# Patient Record
Sex: Male | Born: 1965
Health system: Southern US, Community
[De-identification: ages and names within clinical notes are randomized; demographics above are authoritative.]

## PROBLEM LIST (undated history)

## (undated) DIAGNOSIS — R251 Tremor, unspecified: Secondary | ICD-10-CM

## (undated) DIAGNOSIS — R209 Unspecified disturbances of skin sensation: Secondary | ICD-10-CM

## (undated) DIAGNOSIS — K3184 Gastroparesis: Secondary | ICD-10-CM

## (undated) DIAGNOSIS — G894 Chronic pain syndrome: Secondary | ICD-10-CM

## (undated) DIAGNOSIS — C679 Malignant neoplasm of bladder, unspecified: Secondary | ICD-10-CM

## (undated) DIAGNOSIS — G5603 Carpal tunnel syndrome, bilateral upper limbs: Secondary | ICD-10-CM

## (undated) DIAGNOSIS — E119 Type 2 diabetes mellitus without complications: Secondary | ICD-10-CM

## (undated) DIAGNOSIS — K219 Gastro-esophageal reflux disease without esophagitis: Secondary | ICD-10-CM

## (undated) DIAGNOSIS — G5621 Lesion of ulnar nerve, right upper limb: Secondary | ICD-10-CM

## (undated) DIAGNOSIS — Z87898 Personal history of other specified conditions: Secondary | ICD-10-CM

## (undated) DIAGNOSIS — F4310 Post-traumatic stress disorder, unspecified: Secondary | ICD-10-CM

## (undated) DIAGNOSIS — F3181 Bipolar II disorder: Secondary | ICD-10-CM

## (undated) DIAGNOSIS — M1990 Unspecified osteoarthritis, unspecified site: Secondary | ICD-10-CM

## (undated) DIAGNOSIS — R410 Disorientation, unspecified: Secondary | ICD-10-CM

## (undated) DIAGNOSIS — Z87828 Personal history of other (healed) physical injury and trauma: Secondary | ICD-10-CM

## (undated) DIAGNOSIS — G4733 Obstructive sleep apnea (adult) (pediatric): Secondary | ICD-10-CM

## (undated) DIAGNOSIS — Z8711 Personal history of peptic ulcer disease: Secondary | ICD-10-CM

## (undated) DIAGNOSIS — F419 Anxiety disorder, unspecified: Secondary | ICD-10-CM

## (undated) DIAGNOSIS — I2510 Atherosclerotic heart disease of native coronary artery without angina pectoris: Secondary | ICD-10-CM

## (undated) DIAGNOSIS — G4700 Insomnia, unspecified: Secondary | ICD-10-CM

## (undated) DIAGNOSIS — Z8669 Personal history of other diseases of the nervous system and sense organs: Secondary | ICD-10-CM

## (undated) DIAGNOSIS — E039 Hypothyroidism, unspecified: Secondary | ICD-10-CM

## (undated) DIAGNOSIS — M5136 Other intervertebral disc degeneration, lumbar region: Secondary | ICD-10-CM

## (undated) DIAGNOSIS — Z8719 Personal history of other diseases of the digestive system: Secondary | ICD-10-CM

## (undated) DIAGNOSIS — Z8709 Personal history of other diseases of the respiratory system: Secondary | ICD-10-CM

## (undated) DIAGNOSIS — Z8673 Personal history of transient ischemic attack (TIA), and cerebral infarction without residual deficits: Secondary | ICD-10-CM

## (undated) DIAGNOSIS — Z87442 Personal history of urinary calculi: Secondary | ICD-10-CM

## (undated) DIAGNOSIS — Z8551 Personal history of malignant neoplasm of bladder: Secondary | ICD-10-CM

## (undated) DIAGNOSIS — R569 Unspecified convulsions: Secondary | ICD-10-CM

## (undated) DIAGNOSIS — E1142 Type 2 diabetes mellitus with diabetic polyneuropathy: Secondary | ICD-10-CM

## (undated) DIAGNOSIS — M069 Rheumatoid arthritis, unspecified: Secondary | ICD-10-CM

## (undated) DIAGNOSIS — E785 Hyperlipidemia, unspecified: Secondary | ICD-10-CM

## (undated) DIAGNOSIS — Z794 Long term (current) use of insulin: Secondary | ICD-10-CM

## (undated) DIAGNOSIS — F319 Bipolar disorder, unspecified: Secondary | ICD-10-CM

## (undated) DIAGNOSIS — I10 Essential (primary) hypertension: Secondary | ICD-10-CM

## (undated) DIAGNOSIS — Z8619 Personal history of other infectious and parasitic diseases: Secondary | ICD-10-CM

## (undated) DIAGNOSIS — G4730 Sleep apnea, unspecified: Secondary | ICD-10-CM

## (undated) DIAGNOSIS — E291 Testicular hypofunction: Secondary | ICD-10-CM

## (undated) DIAGNOSIS — E1140 Type 2 diabetes mellitus with diabetic neuropathy, unspecified: Secondary | ICD-10-CM

## (undated) DIAGNOSIS — K449 Diaphragmatic hernia without obstruction or gangrene: Secondary | ICD-10-CM

## (undated) DIAGNOSIS — C801 Malignant (primary) neoplasm, unspecified: Secondary | ICD-10-CM

## (undated) DIAGNOSIS — R6889 Other general symptoms and signs: Secondary | ICD-10-CM

## (undated) DIAGNOSIS — M51369 Other intervertebral disc degeneration, lumbar region without mention of lumbar back pain or lower extremity pain: Secondary | ICD-10-CM

## (undated) DIAGNOSIS — J449 Chronic obstructive pulmonary disease, unspecified: Secondary | ICD-10-CM

## (undated) DIAGNOSIS — G250 Essential tremor: Secondary | ICD-10-CM

## (undated) HISTORY — PX: CARDIAC CATHETERIZATION: SHX172

## (undated) HISTORY — DX: Bipolar disorder, unspecified: F31.9

## (undated) HISTORY — DX: Unspecified osteoarthritis, unspecified site: M19.90

## (undated) HISTORY — DX: Atherosclerotic heart disease of native coronary artery without angina pectoris: I25.10

## (undated) HISTORY — PX: OTHER SURGICAL HISTORY: SHX169

## (undated) HISTORY — DX: Gastro-esophageal reflux disease without esophagitis: K21.9

## (undated) HISTORY — PX: CARPAL TUNNEL RELEASE: SHX101

---

## 1998-11-23 ENCOUNTER — Emergency Department (HOSPITAL_COMMUNITY): Admission: EM | Admit: 1998-11-23 | Discharge: 1998-11-23 | Payer: Self-pay | Admitting: *Deleted

## 1999-05-16 ENCOUNTER — Encounter: Admission: RE | Admit: 1999-05-16 | Discharge: 1999-08-14 | Payer: Self-pay | Admitting: Family Medicine

## 2000-02-24 ENCOUNTER — Ambulatory Visit (HOSPITAL_COMMUNITY): Admission: RE | Admit: 2000-02-24 | Discharge: 2000-02-24 | Payer: Self-pay | Admitting: *Deleted

## 2000-02-24 ENCOUNTER — Encounter: Payer: Self-pay | Admitting: *Deleted

## 2000-02-27 ENCOUNTER — Ambulatory Visit (HOSPITAL_COMMUNITY): Admission: RE | Admit: 2000-02-27 | Discharge: 2000-02-27 | Payer: Self-pay | Admitting: General Surgery

## 2000-02-28 ENCOUNTER — Encounter: Payer: Self-pay | Admitting: General Surgery

## 2000-12-10 ENCOUNTER — Encounter: Payer: Self-pay | Admitting: Emergency Medicine

## 2000-12-10 ENCOUNTER — Emergency Department (HOSPITAL_COMMUNITY): Admission: EM | Admit: 2000-12-10 | Discharge: 2000-12-11 | Payer: Self-pay | Admitting: Emergency Medicine

## 2001-12-26 ENCOUNTER — Encounter: Payer: Self-pay | Admitting: Cardiology

## 2001-12-26 ENCOUNTER — Inpatient Hospital Stay (HOSPITAL_COMMUNITY): Admission: AD | Admit: 2001-12-26 | Discharge: 2001-12-27 | Payer: Self-pay | Admitting: Cardiology

## 2002-02-03 ENCOUNTER — Emergency Department (HOSPITAL_COMMUNITY): Admission: EM | Admit: 2002-02-03 | Discharge: 2002-02-03 | Payer: Self-pay | Admitting: Emergency Medicine

## 2002-05-27 ENCOUNTER — Encounter: Admission: RE | Admit: 2002-05-27 | Discharge: 2002-05-27 | Payer: Self-pay | Admitting: Family Medicine

## 2002-05-27 ENCOUNTER — Encounter: Payer: Self-pay | Admitting: Family Medicine

## 2002-06-06 ENCOUNTER — Ambulatory Visit (HOSPITAL_COMMUNITY): Admission: RE | Admit: 2002-06-06 | Discharge: 2002-06-06 | Payer: Self-pay | Admitting: Family Medicine

## 2002-06-06 ENCOUNTER — Encounter: Payer: Self-pay | Admitting: Family Medicine

## 2003-01-31 ENCOUNTER — Emergency Department (HOSPITAL_COMMUNITY): Admission: EM | Admit: 2003-01-31 | Discharge: 2003-01-31 | Payer: Self-pay | Admitting: Emergency Medicine

## 2003-02-22 ENCOUNTER — Ambulatory Visit (HOSPITAL_COMMUNITY): Admission: RE | Admit: 2003-02-22 | Discharge: 2003-02-22 | Payer: Self-pay | Admitting: *Deleted

## 2003-04-16 ENCOUNTER — Ambulatory Visit (HOSPITAL_BASED_OUTPATIENT_CLINIC_OR_DEPARTMENT_OTHER): Admission: RE | Admit: 2003-04-16 | Discharge: 2003-04-16 | Payer: Self-pay | Admitting: Urology

## 2003-06-07 ENCOUNTER — Emergency Department (HOSPITAL_COMMUNITY): Admission: AD | Admit: 2003-06-07 | Discharge: 2003-06-07 | Payer: Self-pay | Admitting: Internal Medicine

## 2003-06-17 ENCOUNTER — Ambulatory Visit (HOSPITAL_COMMUNITY): Admission: RE | Admit: 2003-06-17 | Discharge: 2003-06-17 | Payer: Self-pay | Admitting: Family Medicine

## 2003-09-16 ENCOUNTER — Ambulatory Visit (HOSPITAL_COMMUNITY): Admission: RE | Admit: 2003-09-16 | Discharge: 2003-09-16 | Payer: Self-pay | Admitting: Orthopedic Surgery

## 2003-09-16 ENCOUNTER — Ambulatory Visit (HOSPITAL_BASED_OUTPATIENT_CLINIC_OR_DEPARTMENT_OTHER): Admission: RE | Admit: 2003-09-16 | Discharge: 2003-09-16 | Payer: Self-pay | Admitting: Orthopedic Surgery

## 2003-09-16 HISTORY — PX: CARPAL TUNNEL RELEASE: SHX101

## 2004-12-11 HISTORY — PX: ROTATOR CUFF REPAIR: SHX139

## 2005-01-08 ENCOUNTER — Emergency Department (HOSPITAL_COMMUNITY): Admission: EM | Admit: 2005-01-08 | Discharge: 2005-01-08 | Payer: Self-pay | Admitting: Emergency Medicine

## 2005-04-28 ENCOUNTER — Encounter: Admission: RE | Admit: 2005-04-28 | Discharge: 2005-04-28 | Payer: Self-pay | Admitting: Orthopedic Surgery

## 2005-05-12 ENCOUNTER — Encounter: Admission: RE | Admit: 2005-05-12 | Discharge: 2005-05-12 | Payer: Self-pay | Admitting: Orthopedic Surgery

## 2005-10-13 ENCOUNTER — Emergency Department (HOSPITAL_COMMUNITY): Admission: EM | Admit: 2005-10-13 | Discharge: 2005-10-13 | Payer: Self-pay | Admitting: Emergency Medicine

## 2005-10-16 ENCOUNTER — Emergency Department (HOSPITAL_COMMUNITY): Admission: EM | Admit: 2005-10-16 | Discharge: 2005-10-16 | Payer: Self-pay | Admitting: Family Medicine

## 2005-12-08 ENCOUNTER — Emergency Department (HOSPITAL_COMMUNITY): Admission: EM | Admit: 2005-12-08 | Discharge: 2005-12-08 | Payer: Self-pay | Admitting: Family Medicine

## 2005-12-17 ENCOUNTER — Ambulatory Visit (HOSPITAL_COMMUNITY): Admission: RE | Admit: 2005-12-17 | Discharge: 2005-12-17 | Payer: Self-pay | Admitting: Family Medicine

## 2005-12-18 ENCOUNTER — Emergency Department (HOSPITAL_COMMUNITY): Admission: EM | Admit: 2005-12-18 | Discharge: 2005-12-18 | Payer: Self-pay | Admitting: Emergency Medicine

## 2006-02-06 ENCOUNTER — Ambulatory Visit: Payer: Self-pay | Admitting: Internal Medicine

## 2006-02-20 ENCOUNTER — Ambulatory Visit: Payer: Self-pay | Admitting: Internal Medicine

## 2006-03-13 DIAGNOSIS — Z8673 Personal history of transient ischemic attack (TIA), and cerebral infarction without residual deficits: Secondary | ICD-10-CM

## 2006-03-13 HISTORY — DX: Personal history of transient ischemic attack (TIA), and cerebral infarction without residual deficits: Z86.73

## 2006-12-11 ENCOUNTER — Emergency Department (HOSPITAL_COMMUNITY): Admission: EM | Admit: 2006-12-11 | Discharge: 2006-12-11 | Payer: Self-pay | Admitting: Emergency Medicine

## 2007-01-20 ENCOUNTER — Inpatient Hospital Stay (HOSPITAL_COMMUNITY): Admission: EM | Admit: 2007-01-20 | Discharge: 2007-01-21 | Payer: Self-pay | Admitting: Emergency Medicine

## 2007-05-10 ENCOUNTER — Emergency Department (HOSPITAL_COMMUNITY): Admission: EM | Admit: 2007-05-10 | Discharge: 2007-05-10 | Payer: Self-pay | Admitting: Family Medicine

## 2008-02-13 ENCOUNTER — Encounter: Admission: RE | Admit: 2008-02-13 | Discharge: 2008-02-13 | Payer: Self-pay | Admitting: Internal Medicine

## 2008-03-13 DIAGNOSIS — Z87828 Personal history of other (healed) physical injury and trauma: Secondary | ICD-10-CM

## 2008-03-13 HISTORY — DX: Personal history of other (healed) physical injury and trauma: Z87.828

## 2009-05-26 ENCOUNTER — Inpatient Hospital Stay (HOSPITAL_COMMUNITY): Admission: AD | Admit: 2009-05-26 | Discharge: 2009-05-27 | Payer: Self-pay | Admitting: Cardiology

## 2009-06-27 ENCOUNTER — Emergency Department (HOSPITAL_COMMUNITY): Admission: EM | Admit: 2009-06-27 | Discharge: 2009-06-27 | Payer: Self-pay | Admitting: Emergency Medicine

## 2009-08-20 ENCOUNTER — Emergency Department (HOSPITAL_COMMUNITY): Admission: EM | Admit: 2009-08-20 | Discharge: 2009-08-20 | Payer: Self-pay | Admitting: Emergency Medicine

## 2010-03-21 ENCOUNTER — Emergency Department (HOSPITAL_COMMUNITY)
Admission: EM | Admit: 2010-03-21 | Discharge: 2010-03-21 | Payer: Self-pay | Source: Home / Self Care | Admitting: Family Medicine

## 2010-04-03 ENCOUNTER — Encounter: Payer: Self-pay | Admitting: Specialist

## 2010-06-06 LAB — CBC
HCT: 42.9 % (ref 39.0–52.0)
HCT: 43.5 % (ref 39.0–52.0)
Hemoglobin: 15.2 g/dL (ref 13.0–17.0)
Hemoglobin: 15.4 g/dL (ref 13.0–17.0)
MCHC: 35.5 g/dL (ref 30.0–36.0)
MCHC: 35.5 g/dL (ref 30.0–36.0)
MCV: 86.5 fL (ref 78.0–100.0)
MCV: 86.7 fL (ref 78.0–100.0)
Platelets: 203 10*3/uL (ref 150–400)
Platelets: 222 10*3/uL (ref 150–400)
RBC: 4.95 MIL/uL (ref 4.22–5.81)
RBC: 5.03 MIL/uL (ref 4.22–5.81)
RDW: 12.1 % (ref 11.5–15.5)
RDW: 12.6 % (ref 11.5–15.5)
WBC: 6 10*3/uL (ref 4.0–10.5)
WBC: 6.1 10*3/uL (ref 4.0–10.5)

## 2010-06-06 LAB — DIFFERENTIAL
Basophils Absolute: 0 10*3/uL (ref 0.0–0.1)
Basophils Relative: 1 % (ref 0–1)
Eosinophils Absolute: 0.2 10*3/uL (ref 0.0–0.7)
Eosinophils Relative: 4 % (ref 0–5)
Lymphocytes Relative: 38 % (ref 12–46)
Lymphs Abs: 2.3 10*3/uL (ref 0.7–4.0)
Monocytes Absolute: 0.6 10*3/uL (ref 0.1–1.0)
Monocytes Relative: 9 % (ref 3–12)
Neutro Abs: 2.9 10*3/uL (ref 1.7–7.7)
Neutrophils Relative %: 48 % (ref 43–77)

## 2010-06-06 LAB — PROTIME-INR
INR: 0.84 (ref 0.00–1.49)
Prothrombin Time: 11.4 seconds — ABNORMAL LOW (ref 11.6–15.2)

## 2010-06-06 LAB — HEPARIN LEVEL (UNFRACTIONATED): Heparin Unfractionated: <0.1 IU/mL — ABNORMAL LOW (ref 0.30–0.70)

## 2010-06-06 LAB — CARDIAC PANEL(CRET KIN+CKTOT+MB+TROPI)
CK, MB: 2.1 ng/mL (ref 0.3–4.0)
CK, MB: 2.4 ng/mL (ref 0.3–4.0)
Relative Index: INVALID (ref 0.0–2.5)
Relative Index: INVALID (ref 0.0–2.5)
Total CK: 82 U/L (ref 7–232)
Total CK: 92 U/L (ref 7–232)
Troponin I: 0.02 ng/mL (ref 0.00–0.06)
Troponin I: 0.03 ng/mL (ref 0.00–0.06)

## 2010-06-06 LAB — GLUCOSE, CAPILLARY
Glucose-Capillary: 208 mg/dL — ABNORMAL HIGH (ref 70–99)
Glucose-Capillary: 247 mg/dL — ABNORMAL HIGH (ref 70–99)
Glucose-Capillary: 251 mg/dL — ABNORMAL HIGH (ref 70–99)
Glucose-Capillary: 295 mg/dL — ABNORMAL HIGH (ref 70–99)
Glucose-Capillary: 315 mg/dL — ABNORMAL HIGH (ref 70–99)
Glucose-Capillary: 319 mg/dL — ABNORMAL HIGH (ref 70–99)

## 2010-06-06 LAB — COMPREHENSIVE METABOLIC PANEL
ALT: 24 U/L (ref 0–53)
AST: 21 U/L (ref 0–37)
Albumin: 3.9 g/dL (ref 3.5–5.2)
Alkaline Phosphatase: 71 U/L (ref 39–117)
BUN: 10 mg/dL (ref 6–23)
CO2: 25 mEq/L (ref 19–32)
Calcium: 8.9 mg/dL (ref 8.4–10.5)
Chloride: 102 mEq/L (ref 96–112)
Creatinine, Ser: 0.79 mg/dL (ref 0.4–1.5)
GFR calc Af Amer: >60 mL/min (ref >60)
GFR calc non Af Amer: >60 mL/min (ref >60)
Glucose, Bld: 302 mg/dL — ABNORMAL HIGH (ref 70–99)
Potassium: 4 mEq/L (ref 3.5–5.1)
Sodium: 134 mEq/L — ABNORMAL LOW (ref 135–145)
Total Bilirubin: 1 mg/dL (ref 0.3–1.2)
Total Protein: 6.2 g/dL (ref 6.0–8.3)

## 2010-06-06 LAB — LIPID PANEL
Cholesterol: 224 mg/dL — ABNORMAL HIGH (ref 0–200)
HDL: 23 mg/dL — ABNORMAL LOW (ref >39)
LDL Cholesterol: UNDETERMINED mg/dL (ref 0–99)
Total CHOL/HDL Ratio: 9.7 RATIO
Triglycerides: 832 mg/dL — ABNORMAL HIGH (ref <150)
VLDL: UNDETERMINED mg/dL (ref 0–40)

## 2010-06-06 LAB — TSH: TSH: 4.767 u[IU]/mL — ABNORMAL HIGH (ref 0.350–4.500)

## 2010-07-29 NOTE — Discharge Summary (Signed)
NAME:  Vincent [name], Vincent [name]                         ACCOUNT NO.:  [id number]   MEDICAL RECORD NO.:  [id number]                   PATIENT TYPE:  INP   LOCATION:  2011                                 FACILITY:  [facility]   PHYSICIAN:  Cristy Hilts. Jacinto Halim, M.D.                  DATE OF BIRTH:  1965/05/11   DATE OF ADMISSION:  12/26/2001  DATE OF DISCHARGE:  12/27/2001                                 DISCHARGE SUMMARY   DISCHARGE DIAGNOSES:  1. Unstable angina, resolved, status post catheterization with normal     coronaries during this admission.  2. Hypertension.  3. Hypercholesterolemia.  4. Positive family history of coronary artery disease.  5. Chronic obstructive pulmonary disease.  6. Gastroesophageal reflux disease.  7. Ongoing tobacco use.   HISTORY OF PRESENT ILLNESS:  The patient is a 44 year old, white married  male who presented to the office with complaints of chest pain.  He says he  has been having this pain for 1-1/2 years which has progressively worsened  and over the past five months, had increased in frequency and intensity.  The pain radiated to the back and sometimes he experiences discomfort around  the diaphragm with shortness of breath and extreme diaphoresis.  He also  experiences presyncope and weakness.   The patient's wife insisted that he be seen in the office and he was a  direct admit from the office with a plan to undergo cardiac catheterization  the next day.   PROCEDURE:  Cardiac catheterization the next morning on December 27, 2001.  The catheterization was tolerated well and carried out with no  complications.  The procedure revealed normal coronaries and normal left  ventricular systolic function with ejection fraction 60% and end-diastolic  pressure 18.   HOSPITAL COURSE:  The patient was considered to be stable for discharge from  a cardiovascular standpoint and discharge was carried out post bed rest time  expired.  During the hospital stay, other  laboratories revealed hemoglobin  15.3, hematocrit 45.6, white count 10.1, platelets 262.  Sodium was 136,  potassium 3.7, chloride 98, CO2 25, glucose 305, BUN 10 and creatinine 0.8  with calcium 8.8.  Hemoglobin A1C was elevated at 7.8.  Cholesterol was 276,  triglycerides 1783 with cholesterol HDL 223 and LDL not calculated.   DISCHARGE MEDICATIONS:  1. Avapro 150 mg daily.  2. Amaryl 4 mg daily.  3. Pravachol 40 mg daily.  4. Tri-Chlor 150 mg daily.  5. Coated aspirin 325 mg daily.  6. Protonix 40 mg daily.   ACTIVITY:  No driving.  No lifting greater than 10 pounds.  No strenuous  activity x3 days.   SPECIAL INSTRUCTIONS:  The patient was allowed to shower and instructed to  report any problems of his groin site to our office and number was provided.   DIET:  Low fat, low carbohydrate, low sweets and low alcohol diet.  FOLLOW UP:  Dr. Verl Dicker office is to call the patient to set up an  appointment to be seen after discharge.      Raymon Mutton, P.A.                    Cristy Hilts. Jacinto Halim, M.D.    MK/MEDQ  D:  12/27/2001  T:  12/29/2001  Job:  [id number]   cc:   Donia Guiles, MD

## 2010-07-29 NOTE — H&P (Signed)
NAME:  Vincent [name], Vincent [name]                         ACCOUNT NO.:  [id number]   MEDICAL RECORD NO.:  [id number]                   PATIENT TYPE:  INP   LOCATION:  2011                                 FACILITY:  [facility]   PHYSICIAN:  Cristy Hilts. Jacinto Halim, M.D.                  DATE OF BIRTH:  04/26/1965   DATE OF ADMISSION:  12/26/2001  DATE OF DISCHARGE:                                HISTORY & PHYSICAL   ADMISSION DIAGNOSES:  1. Unstable angina.  2. Hypertension.  3. Noninsulin-dependent diabetes.  4. Hyperlipidemia.  5. Tobacco use.  6. Gastroesophageal reflux.   CHIEF COMPLAINT:  Progressive chest pain/angina.   HISTORY OF PRESENT ILLNESS:  This is a 45 year old married white male with a  number of cardiac risk factors including hypertension, hyperlipidemia  (triglycerides in the 1,200s), NIDDM x3 years, and tobacco of 60 pack years.  Wife scheduled today's appointment secondary to increased episodes of chest  pain. The patient relates one and a have history of progressive angina with  activity and rest. Over the past five months, he has had increased frequency  of angina, occurring every other day on average; 5/10, intense pressure  like standing on my chest, with radiation to the back and discomfort  around the diaphragm. Positive shortness of breath/smothering feeling and  extreme diaphoresis associated. Presyncope as well. Episodes last 5 to 20  minutes. Has been awoken from sleep. Symptoms generally resolve with rest or  time. He did have an episode of 20 minute chest pain this morning. He eats  aspirin like candy.   ALLERGIES:  Sulfa.   MEDICATIONS:  1. Amaryl 4 mg.  2. Micardis 40 mg.  3. Zetia 10 mg.  4. Pravachol 40 mg.  5. Prilosec 20 mg.  6. Aspirin.  7. Diflucan 150 mg a day for seven days.  8. Cephalexin 250 mg four a day for seven days.   PAST MEDICAL HISTORY:  1. Significant for hyperlipidemia for two years; fasting lipid profile     10/19/01 showed total cholesterol  222, triglycerides 1,206, HDL 27, LDL not     calculated.  2. Hypertension for four months.  3. Diabetes mellitus type 2 for three years with some symptoms of     neuropathy.  4. GERD.  5. Recurrent UTI.   FAMILY HISTORY:  Mother age 36 with hypertension and diabetes. Father age 110  with hypertension and diabetes and stroke.   SOCIAL HISTORY:  The patient is married for three years. Father of three  (11, 4, 17 months). He is long distance, cross-country Naval architect. Three  pack a day tobacco for 20 years. Positive alcohol. No illicit drug use.   REVIEW OF SYMPTOMS:  No fevers, chills, or cough. He has no teeth. Positive  dyspnea on exertion. No PND or orthopnea. Positive UTI with hematuria on  antibiotics (Dr. Etta Grandchild follows him). Positive numbness and tingling of the  feet, blurred vision at a distance for one week.   PHYSICAL EXAMINATION:  VITAL SIGNS:  Blood pressure 156/88, pulse 92,  respirations 18, weight 237, height 5 feet 11-1/2.  GENERAL:  The patient is alert and oriented x3 in no acute distress,  accompanied by his wife.  HEENT:  Normocephalic, atraumatic. PERRLA, EOMI. Nares patent. Oropharynx  benign; no teeth.  NECK:  Supple without bruits or masses.  LUNGS:  Clear to auscultation without wheeze.  HEART:  Regular, rate, and rhythm without murmurs, gallops, or rubs.  ABDOMEN:  Soft, nontender, nondistended, normal active bowel sounds in all  four quadrants. Nodes none. Obese. No bruits.  EXTREMITIES:  With 2+ femoral pulses bilaterally, no bruits. Two pulses  bilaterally without edema. NEUROLOGICAL:  Nonfocal.  RECTAL:  Not performed in the office. Stools can be heme checked in the  hospital.   LABORATORY DATA:  EKG shows normal sinus rhythm without acute ST-T wave  abnormality.   The patient was seen and examined by Dr. Yates Decamp.   IMPRESSION:  1. Unstable angina.  2. Hypertension.  3. Noninsulin-dependent diabetes.  4. Hyperlipidemia with elevated  triglycerides.  5. Tobacco abuse.  6. Gastroesophageal reflux disease.  7. Urinary tract infection.   PLAN:  Admit. IV heparin. IV nitroglycerin. Aspirin. Beta blocker. ARB  therapy. Statin therapy. Heart catheterization tomorrow.   The risks and benefits of the catheterization are reviewed with the patient  and his wife. They agree to proceed. He is for a direct admit from the  office.     Georgiann Cocker Jernejcic, P.A.                   Cristy Hilts. Jacinto Halim, M.D.    TCJ/MEDQ  D:  12/26/2001  T:  12/27/2001  Job:  [id number]

## 2010-07-29 NOTE — Assessment & Plan Note (Signed)
[facility]                           GASTROENTEROLOGY OFFICE NOTE   NAME:[name], [name]                      MRN:          [id number]  DATE:02/06/2006                            DOB:          04/01/1965    The patient is self-referred.   REASON FOR CONSULTATION:  Diarrhea.   HISTORY:  This is a 45 year old white male with diabetes mellitus,  hypertension, and coronary artery disease for which he has undergone prior  coronary angioplasty.  He presents today for evaluation of diarrhea.  He was  evaluated in this office June 02, 2002 for weight loss, change in bowel  habits, and consideration of endoscopic evaluation.  His problems were felt  secondary to poorly-controlled diabetes.  He underwent both colonoscopy and  upper endoscopy.  Colonoscopy, including intubation of the terminal ileum,  was entirely normal.  Upper endoscopy was normal, except for the presence of  a hiatal hernia.  He takes Prilosec for reflux disease.  This controls  symptoms well.  He states that he was in his usual state of health until  about 2 to 3 weeks ago when he reports the development of postprandial  diarrhea.  Diarrhea occurring within 1 hour of eating.  More recently  diarrhea without eating.  To date, no nocturnal diarrhea.  He states the  diarrhea has a foul odor, though no unusual color.  He denies fevers or  bleeding.  He has had some abdominal discomfort.  No weight loss.  Of  importance, he was treated in October with a combination of amoxicillin and  Augmentin, as well as Diflucan for some ear, nose, and throat disorder  diagnosed by Dr. Lazarus Salines.  He has tried Imodium, Kaopectate, and Pepto-  Bismol without improvement.  He denies any of his medications are new within  the past year.  In particular, he states he has been on metformin for years.   PAST MEDICAL HISTORY:  As above.   PAST SURGICAL HISTORY:  Rotator cuff tear surgery.   ALLERGIES:  1.  SULFA.  2. CELEBREX.   CURRENT MEDICATIONS:  1. Metformin 1000 mg b.i.d.  2. Glimepiride 8 mg b.i.d.  3. Lantus insulin 40 units daily.  4. Citalopram unspecified dosage daily.  5. B12 daily.  6. Prilosec 20 mg daily.  7. He also takes Excedrin p.r.n.   FAMILY HISTORY:  No family history of gastrointestinal malignancy or  inflammatory bowel disease.   SOCIAL HISTORY:  The patient is married.  He is accompanied by his wife.  He  has 1 step-son and 3 daughters, tenth grade education.  He is employed as a  Clinical cytogeneticist.  He smokes.  He uses alcohol.   REVIEW OF SYSTEMS:  Per diagnostic evaluation form.   PHYSICAL EXAM:  A well-appearing male in no acute distress.  Blood pressure 142/92, heart rate is 88, weight is 248 pounds.  He is 5 feet  11-1/2 inches in height.  HEENT:  Sclerae anicteric, conjunctivae are pink.  Oral mucosa is intact.  Tongue is tobacco stained.  LUNGS:  Clear.  HEART:  Regular.  ABDOMEN:  Obese and soft without tenderness, mass, or hernia.  Good bowel  sounds heard.  EXTREMITIES:  Without edema.   IMPRESSION:  This is a 45 year old with multiple medical problems who  presents with a 2 to 3 week history of diarrhea.  This shortly after  treatment with broad-spectrum antibiotics.  Rule out antibiotic-associated  diarrhea.  Rule out Clostridium difficile associated diarrhea.  Rule out  other infectious processes (viral or non-viral).   RECOMMENDATIONS:  1. Stool studies including stool culture, clostridium difficile toxin,      qualitative fat, ova and parasites, and leukocytes.  2. Empiric treatment with Flagyl 250 mg p.o. q.i.d. x10 days.  Advised not      to use alcohol.  3. Solicit old records from Dr. Lazarus Salines for review (DONE).  4. GI office followup in 2 weeks.     Wilhemina Bonito. Marina Goodell, MD  Electronically Signed    JNP/MedQ  DD: 02/06/2006  DT: 02/06/2006  Job #: [id number]

## 2010-07-29 NOTE — Assessment & Plan Note (Signed)
[facility]                         GASTROENTEROLOGY OFFICE NOTE   NAME:Soliday, CHAYIM BIALAS                      MRN:          [id number]  DATE:02/20/2006                            DOB:          07/19/1965    HISTORY:  The patient presents today for followup.  He is a 45 year old  with diabetes mellitus, hypertension, and coronary artery disease.  He  was evaluated on February 06, 2006, for diarrhea.  He underwent multiple  stool studies for enteric pathogens, ova and parasites, Clostridium  difficile toxin (prior recent antibiotic exposure), qualitative fat, and  leukocytes.  All were negative.  He was treated empirically with Flagyl  250 mg p.o. q.i.d. for 10 days.  He finished all but 3 pills.  He  reports no change in symptoms.  He returns today for followup as  requested.   The patient reports continuing with 5-6 loose bowel movements per day.  He has 2 immediately upon awakening in the morning.  Symptoms generally  occur after meals.  He is not awoken at night with symptoms.  He does  not describe steatorrhea.  His diabetes has been under good control.  He  does not use sugar substitutes.  He has had no weight loss.   He states his medications are unchanged and include Metformin,  glimepiride, Lantus insulin, citalopram, B12, Prilosec, Lisinopril,  TriCor, and fish oil.   PHYSICAL EXAMINATION:  GENERAL:  Well-appearing male in no acute  distress.  VITAL SIGNS:  Blood pressure is 124/80, heart rate is 80 and regular,  weight is 248 pounds (no change).  ABDOMEN:  Soft without tenderness, mass, or hernia.  Good bowel sounds  heard.   IMPRESSION:  A 4 to 5-week history of diarrhea.  Negative stool studies  as described.  Negative response to metronidazole.  No worrisome  features.  Suspect infectious gastroenteritis or possibly post  infectious irritable bowel syndrome.   RECOMMENDATIONS:  1. Treat symptomatically with Lomotil 1-2 p.o. t.i.d.  p.r.n.  2. Empiric course of Xifaxan 200 mg p.o. t.i.d. for ten days.  A      prescription as well as samples have been provided.  3. Office followup in 2 weeks.  If symptoms persist, consider      expanding workup including laboratory screening for sprue and      possibly repeat colonoscopy with biopsies to rule out microscopic      colitis.  Additionally, it may be worth him discussing with his      primary care Gabryella Murfin the prospects of holding Metformin as this      can indeed cause diarrhea.  However,      he has been on this  medication for a while. I would hold on this      latter manipulation for now.     Wilhemina Bonito. Marina Goodell, MD  Electronically Signed    JNP/MedQ  DD: 02/20/2006  DT: 02/20/2006  Job #: [id number]   cc:   Tracey Harries, M.D.

## 2010-07-29 NOTE — Cardiovascular Report (Signed)
NAME:  Vincent [name], Vincent [name]                         ACCOUNT NO.:  [id number]   MEDICAL RECORD NO.:  [id number]                   PATIENT TYPE:  INP   LOCATION:  2011                                 FACILITY:  [facility]   PHYSICIAN:  Cristy Hilts. Jacinto Halim, M.D.                  DATE OF BIRTH:  06-08-1965   DATE OF PROCEDURE:  12/27/2001  DATE OF DISCHARGE:                              CARDIAC CATHETERIZATION   PROCEDURE:  Left heart catheterization with left ventriculography, selective  left and right coronary arteriography, abdominal aortogram, atrial  angiography with right femoral artery access with Perclose.   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  Given his multiple cardiac risk factors and his ongoing  chest pain, he was brought to the cardiac catheterization lab to evaluate  for coronary anatomy.   HEMODYNAMIC DATA:  The left ventricular pressure square 8/6 end diastolic  pressure 18 mmHg.  Aortic pressure 108/77 with a mean of 93 mmHg.  There was  no pressure gradient across the aortic valve.   ANGIOGRAPHIC DATA:  1. Left ventricle.  Left ventricular systolic function was normal and EF was     estimated at 60%.  There is no significant mitral regurgitation.  2. Right coronary artery is a large caliber, dominant vessel.  It is smooth     and disease-free.  3. The left main coronary artery is a large caliber vessel.  It is normal.  4. Circumflex coronary artery is a large caliber vessel and it gives origin     to a large obtuse marginal 1 and continues as a small distal circumflex.     It is normal.  5. Ramus intermedius is very small and measures about 1.0 mm.  It is normal.  6. Left anterior descending artery is a large vessel.  It gives origin to a     very large diagonal 1 which is equal  to his left anterior descending     artery itself.  The diagonal 1 has multiple secondary branches.  They all     are disease-free.  7. Abdominal aortogram.  Abdominal aortogram revealed normal abdominal aorta     with widely patent renal arteries.  There is one renal artery on each     side.  8. Femoral angiography revealed good arterial access site.   IMPRESSION:  1. Normal left ventricular systolic function, ejection fraction 60%.  2. Normal coronary arteries.  3. Normal abdominal aorta with normal renal arteries.   RECOMMENDATIONS:  At this point, the chest pain is probably of noncardiac  etiology.  This probably is related to gastroesophageal reflux disease with  esophageal spasm and also COPD may be contributing to his shortness of  breath.  His  LVEDP is also slightly elevated and better control of  hypertension is indicated.   The patient can be discharged home on proton pump inhibitor.   DESCRIPTION OF PROCEDURE:  Under the usual sterile precautions using 6  French right femoral artery access, 6 [place] multipurpose B2 catheter was  advanced __________.  The catheter was advanced to the left ventricle.  The  left ventricle appeared normal.  Catheterization of the left ventricle was  performed in both RAO and LAO projections.  The catheter was pulled back  __________.  The right coronary artery was selectively cannulated and  angiography was performed. In the same fashion, the left main artery was  selectively cannulated and angiography was performed.  When the catheter was  pulled back in the abdominal aorta and abdominal aortogram was performed.  Then the catheter was pulled out of the body.  Right femoral angiography was  performed through the arterial access sheath and access was closed with  Perclose.  The patient was transferred to the recovery room in stable  condition.  The patient tolerated the procedure well.                                               Cristy Hilts.  Jacinto Halim, M.D.    Pilar Plate  D:  12/27/2001  T:  12/29/2001  Job:  [id number]   cc:   Donia Guiles, MD   [facility]

## 2010-07-29 NOTE — Op Note (Signed)
NAME:  Vincent [name], Vincent [name]                         ACCOUNT NO.:  [id number]   MEDICAL RECORD NO.:  [id number]                   PATIENT TYPE:  AMB   LOCATION:  [facility]                                  FACILITY:  [facility]   PHYSICIAN:  Feliberto Gottron. Turner Daniels, M.D.                DATE OF BIRTH:  1965/11/14   DATE OF PROCEDURE:  DATE OF DISCHARGE:                                 OPERATIVE REPORT   DATE OF OPERATION:  September 16, 2003.   PREOPERATIVE DIAGNOSES:  Right carpal tunnel syndrome.   POSTOPERATIVE DIAGNOSES:  Right carpal tunnel syndrome.   PROCEDURE:  Right carpal tunnel release.   SURGEON:  Feliberto Gottron. Turner Daniels, MD.   FIRST ASSISTANT:  Skip Mayer, PA-C.   ANESTHETIC:  IV regional.   ESTIMATED BLOOD LOSS:  Minimal.   FLUIDS REPLACED:  500 mL of crystalloid.   DRAINS PLACED:  None.   TOURNIQUET TIME:  25 minutes.   INDICATIONS FOR PROCEDURE:  A 46 year old man with EMG-proven, severe right  carpal tunnel syndrome, who desires carpal tunnel release to decrease pain  and increase function.  I believe he is actually having some axonal dropout  on EMGs; therefore, his surgery is a little bit more than just being  elective.  It is also to prevent loss of function.   DESCRIPTION OF PROCEDURE:  Patient identified by arm band, taken to the  operating room at [facility], appropriate anesthetic monitors  were attached, and IV regional anesthesia induced into the right upper  extremity using a forearm tourniquet and Bier block.  The right hand was  then prepped and draped in the usual sterile fashion from the fingertips to  the tourniquet, and we began the procedure by making a volar midline  incision starting out the wrist flexion crease and going distally for about  4 cm just to the ulnar side of the thenar crease.  Small bleeders in the  skin and subcutaneous tissue were cauterized with a bipolar.  We cut down  into the palmar fascia distally and made a small, longitudinal incision  in  the transverse fascia entering the carpal tunnel.  A Freer elevator was then  placed into the carpal tunnel and kept it just beneath the transverse carpal  ligament volarly, allowing [place] to cut down on the [place] performing the carpal  tunnel release from distal to proximal.  The release was then taken up into  the forearm fascia with a black-handled scissors under direct visualization.  This allowed [place] to examine the median nerve and tendons of the carpal  tunnel.  No masses or ganglia were found.  The nerve was noted to hourglass  down near the transverse carpal ligament consistent with carpal tunnel  syndrome.  The wound was then  irrigated out with normal saline solution and the skin only closed with  running 5-0 nylon suture.  A dressing of Xerofoam, 4x4  dressing sponges,  Webril, and an Ace wrap applied.  The patient was then awakened and taken to  the recovery room without difficulty.                                               Feliberto Gottron. Turner Daniels, M.D.    Ovid Curd  D:  09/16/2003  T:  09/16/2003  Job:  [id number]

## 2010-07-29 NOTE — Op Note (Signed)
NAME:  REA, RESER                         ACCOUNT NO.:  [id number]   MEDICAL RECORD NO.:  [id number]                   PATIENT TYPE:  AMB   LOCATION:  [facility]                                 FACILITY:  [facility]   PHYSICIAN:  Claudette Laws, M.D.               DATE OF BIRTH:  April 28, 1965   DATE OF PROCEDURE:  04/16/2003  DATE OF DISCHARGE:                                 OPERATIVE REPORT   PREOPERATIVE DIAGNOSIS:  Phimosis with recurrent balanitis.   POSTOPERATIVE DIAGNOSIS:  Phimosis with recurrent balanitis.   OPERATION:  Circumcision.   SURGEON:  Claudette Laws, M.D.   DESCRIPTION OF PROCEDURE:  The patient was prepped and draped in the supine  position under LMA anesthesia.  A marking pen was used to outline circular  incisions on the shaft and about 4-5 mm below the corona.  An incision was  made with a knife.  Foreskin was then removed with Metzenbaum scissors.  All  bleeders were electrocoagulated or suture ligated with a 3-0 chromic catgut  suture.  He did have a lot of dilated vessels beneath the skin.  We then  reapproximated the shaft skin to the mucosa with interrupted 3-0 chromic  sutures.  Prior to completing the circumcision he underwent a penile block  with 10 mL of 1% Xylocaine plain mixed with 0.25% plain Marcaine.  A  Xeroform gauze was placed around the incision, followed by circular gauze  dressing and then Coban.  The patient was then taken back to the recovery  room in satisfactory condition.                                               Claudette Laws, M.D.    RFS/MEDQ  D:  04/16/2003  T:  04/16/2003  Job:  [id number]

## 2010-07-29 NOTE — Discharge Summary (Signed)
Vincent [name], [name]               ACCOUNT NO.:  [id number]   MEDICAL RECORD NO.:  [id number]          PATIENT TYPE:  INP   LOCATION:  3030                         FACILITY:  [facility]   PHYSICIAN:  Georgann Housekeeper, MD      DATE OF BIRTH:  12/01/1965   DATE OF ADMISSION:  01/20/2007  DATE OF DISCHARGE:  01/21/2007                               DISCHARGE SUMMARY   DISCHARGE DIAGNOSES:  1. Probable transient ischemia attack.  2. History of hypertension.  3. Diabetes.  4. Dyslipidemia.   MEDICATIONS ON DISCHARGE:  1. Metformin 1000 mg b.i.d.  2. Amaryl 8 mg b.i.d.  3. Lantus 50 units daily.  4. Celexa 10 mg daily.  5. Prilosec 20 mg daily.  6. Zocor 20 mg every day.  7. Lisinopril/hydrochlorothiazide 20/12.5 mg a day.  8. Aggrenox 1 capsule twice a day.   As far as her CT of the head, was negative.  MRI/MRA was negative.  Homocysteine level is 10.3, which is normal.  Lipid profile:  Cholesterol 224, LDL of 130.  Normal chemistries.  A1c was 8.4.   The blood pressure 134/68.  Pulse 68.   HOSPITAL COURSE:  A 45 year old male with the above medical conditions  admitted with the episode of right-sided facial numbness and tingling as  well as upper arm and lower extremity had a little bit of difficulty  with the stumbling and finding words, that lasted about 3 episodes and  some confusion.  At the time in the emergency room, he was resolved of  his symptoms.  Admitted.   PROBLEM:  Symptoms above.  Was admitted to telemetry.  Head CT was  negative.  He had no more episode of the weakness or slurred speech.  CT  scan in the emergency room, for the head, was negative.  He had MRI/MRA  of the brain, which was negative.  Symptoms resolved.  As far as his  sugars, remained stable as well as blood chemistries and blood pressure.  The echocardiogram was not done.  We will do it outpatient if needed.  He had an MRA of the  neck, which was negative as well as a MRA of the brain.  Start him on  Aggrenox 1 capsule twice a day for probable TIA because of risk factors  and continue his diabetic and hyperglycemic medication and dyslipidemic  medications.  I will see back at the office in 1 week follow up.      Georgann Housekeeper, MD  Electronically Signed     KH/MEDQ  D:  01/22/2007  T:  01/22/2007  Job:  [id number]

## 2010-08-25 ENCOUNTER — Emergency Department (HOSPITAL_COMMUNITY)
Admission: EM | Admit: 2010-08-25 | Discharge: 2010-08-25 | Disposition: A | Discharge status: Home / Self Care | Payer: 59 | Attending: Emergency Medicine | Admitting: Emergency Medicine

## 2010-08-25 DIAGNOSIS — E119 Type 2 diabetes mellitus without complications: Secondary | ICD-10-CM | POA: Insufficient documentation

## 2010-08-25 DIAGNOSIS — Z7721 Contact with and (suspected) exposure to potentially hazardous body fluids: Secondary | ICD-10-CM | POA: Insufficient documentation

## 2010-08-25 DIAGNOSIS — E785 Hyperlipidemia, unspecified: Secondary | ICD-10-CM | POA: Insufficient documentation

## 2010-08-25 DIAGNOSIS — J449 Chronic obstructive pulmonary disease, unspecified: Secondary | ICD-10-CM | POA: Insufficient documentation

## 2010-08-25 DIAGNOSIS — Z79899 Other long term (current) drug therapy: Secondary | ICD-10-CM | POA: Insufficient documentation

## 2010-08-25 DIAGNOSIS — Z794 Long term (current) use of insulin: Secondary | ICD-10-CM | POA: Insufficient documentation

## 2010-08-25 DIAGNOSIS — I10 Essential (primary) hypertension: Secondary | ICD-10-CM | POA: Insufficient documentation

## 2010-08-25 DIAGNOSIS — Z8673 Personal history of transient ischemic attack (TIA), and cerebral infarction without residual deficits: Secondary | ICD-10-CM | POA: Insufficient documentation

## 2010-08-25 DIAGNOSIS — J4489 Other specified chronic obstructive pulmonary disease: Secondary | ICD-10-CM | POA: Insufficient documentation

## 2010-08-25 LAB — HEPATITIS B SURFACE ANTIGEN: Hepatitis B Surface Ag: NEGATIVE

## 2010-08-25 LAB — HEPATITIS C ANTIBODY (REFLEX): HCV Ab: NEGATIVE

## 2010-08-25 LAB — HIV RAPID SCREEN (BLD OR BODY FLD EXPOSURE): Rapid HIV Screen: NONREACTIVE

## 2010-09-29 ENCOUNTER — Inpatient Hospital Stay (INDEPENDENT_AMBULATORY_CARE_PROVIDER_SITE_OTHER)
Admission: RE | Admit: 2010-09-29 | Discharge: 2010-09-29 | Disposition: A | Discharge status: Home / Self Care | Payer: 59 | Source: Ambulatory Visit | Attending: Emergency Medicine | Admitting: Emergency Medicine

## 2010-09-29 DIAGNOSIS — R197 Diarrhea, unspecified: Secondary | ICD-10-CM

## 2010-09-29 DIAGNOSIS — L989 Disorder of the skin and subcutaneous tissue, unspecified: Secondary | ICD-10-CM

## 2010-09-29 LAB — POCT URINALYSIS DIP (DEVICE)
Bilirubin Urine: NEGATIVE
Glucose, UA: 500 mg/dL — AB
Hgb urine dipstick: NEGATIVE
Ketones, ur: NEGATIVE mg/dL
Leukocytes, UA: NEGATIVE
Nitrite: NEGATIVE
Protein, ur: NEGATIVE mg/dL
Specific Gravity, Urine: 1.01 (ref 1.005–1.030)
Urobilinogen, UA: 0.2 mg/dL (ref 0.0–1.0)
pH: 5 (ref 5.0–8.0)

## 2010-09-29 LAB — CBC
HCT: 46.1 % (ref 39.0–52.0)
Hemoglobin: 17.2 g/dL — ABNORMAL HIGH (ref 13.0–17.0)
MCH: 30.4 pg (ref 26.0–34.0)
MCHC: 37.3 g/dL — ABNORMAL HIGH (ref 30.0–36.0)
MCV: 81.4 fL (ref 78.0–100.0)
Platelets: 186 10*3/uL (ref 150–400)
RBC: 5.66 MIL/uL (ref 4.22–5.81)
RDW: 12.1 % (ref 11.5–15.5)
WBC: 8.3 10*3/uL (ref 4.0–10.5)

## 2010-09-29 LAB — COMPREHENSIVE METABOLIC PANEL
ALT: 28 U/L (ref 0–53)
AST: 15 U/L (ref 0–37)
Albumin: 4 g/dL (ref 3.5–5.2)
Alkaline Phosphatase: 81 U/L (ref 39–117)
BUN: 12 mg/dL (ref 6–23)
CO2: 22 mEq/L (ref 19–32)
Calcium: 10 mg/dL (ref 8.4–10.5)
Chloride: 96 mEq/L (ref 96–112)
Creatinine, Ser: 0.48 mg/dL — ABNORMAL LOW (ref 0.50–1.35)
GFR calc Af Amer: >60 mL/min (ref >60)
GFR calc non Af Amer: >60 mL/min (ref >60)
Glucose, Bld: 392 mg/dL — ABNORMAL HIGH (ref 70–99)
Potassium: 4.3 mEq/L (ref 3.5–5.1)
Sodium: 134 mEq/L — ABNORMAL LOW (ref 135–145)
Total Bilirubin: 0.6 mg/dL (ref 0.3–1.2)
Total Protein: 7 g/dL (ref 6.0–8.3)

## 2010-09-29 LAB — POCT I-STAT, CHEM 8
BUN: 11 mg/dL (ref 6–23)
Calcium, Ion: 1.18 mmol/L (ref 1.12–1.32)
Chloride: 102 mEq/L (ref 96–112)
Creatinine, Ser: 0.7 mg/dL (ref 0.50–1.35)
Glucose, Bld: 407 mg/dL — ABNORMAL HIGH (ref 70–99)
HCT: 52 % (ref 39.0–52.0)
Hemoglobin: 17.7 g/dL — ABNORMAL HIGH (ref 13.0–17.0)
Potassium: 4.5 mEq/L (ref 3.5–5.1)
Sodium: 133 mEq/L — ABNORMAL LOW (ref 135–145)
TCO2: 23 mmol/L (ref 0–100)

## 2010-09-29 LAB — DIFFERENTIAL
Basophils Absolute: 0.1 10*3/uL (ref 0.0–0.1)
Basophils Relative: 1 % (ref 0–1)
Eosinophils Absolute: 0.2 10*3/uL (ref 0.0–0.7)
Eosinophils Relative: 2 % (ref 0–5)
Lymphocytes Relative: 32 % (ref 12–46)
Lymphs Abs: 2.7 10*3/uL (ref 0.7–4.0)
Monocytes Absolute: 0.5 10*3/uL (ref 0.1–1.0)
Monocytes Relative: 6 % (ref 3–12)
Neutro Abs: 4.8 10*3/uL (ref 1.7–7.7)
Neutrophils Relative %: 59 % (ref 43–77)

## 2010-09-29 LAB — PROTIME-INR
INR: 0.84 (ref 0.00–1.49)
Prothrombin Time: 11.7 seconds (ref 11.6–15.2)

## 2010-09-29 LAB — APTT: aPTT: 32 seconds (ref 24–37)

## 2010-10-13 ENCOUNTER — Encounter (HOSPITAL_COMMUNITY): Payer: Self-pay | Admitting: Radiology

## 2010-10-13 ENCOUNTER — Emergency Department (HOSPITAL_COMMUNITY)
Admission: EM | Admit: 2010-10-13 | Discharge: 2010-10-13 | Disposition: A | Discharge status: Home / Self Care | Payer: 59 | Attending: Emergency Medicine | Admitting: Emergency Medicine

## 2010-10-13 ENCOUNTER — Emergency Department (HOSPITAL_COMMUNITY): Payer: 59

## 2010-10-13 DIAGNOSIS — Z7982 Long term (current) use of aspirin: Secondary | ICD-10-CM | POA: Insufficient documentation

## 2010-10-13 DIAGNOSIS — E785 Hyperlipidemia, unspecified: Secondary | ICD-10-CM | POA: Insufficient documentation

## 2010-10-13 DIAGNOSIS — R197 Diarrhea, unspecified: Secondary | ICD-10-CM | POA: Insufficient documentation

## 2010-10-13 DIAGNOSIS — F329 Major depressive disorder, single episode, unspecified: Secondary | ICD-10-CM | POA: Insufficient documentation

## 2010-10-13 DIAGNOSIS — E119 Type 2 diabetes mellitus without complications: Secondary | ICD-10-CM | POA: Insufficient documentation

## 2010-10-13 DIAGNOSIS — J449 Chronic obstructive pulmonary disease, unspecified: Secondary | ICD-10-CM | POA: Insufficient documentation

## 2010-10-13 DIAGNOSIS — I10 Essential (primary) hypertension: Secondary | ICD-10-CM | POA: Insufficient documentation

## 2010-10-13 DIAGNOSIS — R1011 Right upper quadrant pain: Secondary | ICD-10-CM | POA: Insufficient documentation

## 2010-10-13 DIAGNOSIS — L738 Other specified follicular disorders: Secondary | ICD-10-CM | POA: Insufficient documentation

## 2010-10-13 DIAGNOSIS — J4489 Other specified chronic obstructive pulmonary disease: Secondary | ICD-10-CM | POA: Insufficient documentation

## 2010-10-13 DIAGNOSIS — R110 Nausea: Secondary | ICD-10-CM | POA: Insufficient documentation

## 2010-10-13 DIAGNOSIS — F3289 Other specified depressive episodes: Secondary | ICD-10-CM | POA: Insufficient documentation

## 2010-10-13 DIAGNOSIS — Z79899 Other long term (current) drug therapy: Secondary | ICD-10-CM | POA: Insufficient documentation

## 2010-10-13 DIAGNOSIS — Z8673 Personal history of transient ischemic attack (TIA), and cerebral infarction without residual deficits: Secondary | ICD-10-CM | POA: Insufficient documentation

## 2010-10-13 DIAGNOSIS — R12 Heartburn: Secondary | ICD-10-CM | POA: Insufficient documentation

## 2010-10-13 DIAGNOSIS — R079 Chest pain, unspecified: Secondary | ICD-10-CM | POA: Insufficient documentation

## 2010-10-13 DIAGNOSIS — R0602 Shortness of breath: Secondary | ICD-10-CM | POA: Insufficient documentation

## 2010-10-13 DIAGNOSIS — L678 Other hair color and hair shaft abnormalities: Secondary | ICD-10-CM | POA: Insufficient documentation

## 2010-10-13 HISTORY — DX: Chronic obstructive pulmonary disease, unspecified: J44.9

## 2010-10-13 LAB — COMPREHENSIVE METABOLIC PANEL
ALT: 20 U/L (ref 0–53)
AST: 12 U/L (ref 0–37)
Albumin: 3.6 g/dL (ref 3.5–5.2)
Alkaline Phosphatase: 69 U/L (ref 39–117)
BUN: 10 mg/dL (ref 6–23)
CO2: 24 mEq/L (ref 19–32)
Calcium: 8.9 mg/dL (ref 8.4–10.5)
Chloride: 102 mEq/L (ref 96–112)
Creatinine, Ser: 0.51 mg/dL (ref 0.50–1.35)
GFR calc Af Amer: >60 mL/min (ref >60)
GFR calc non Af Amer: >60 mL/min (ref >60)
Glucose, Bld: 270 mg/dL — ABNORMAL HIGH (ref 70–99)
Potassium: 3.8 mEq/L (ref 3.5–5.1)
Sodium: 136 mEq/L (ref 135–145)
Total Bilirubin: 0.6 mg/dL (ref 0.3–1.2)
Total Protein: 6.2 g/dL (ref 6.0–8.3)

## 2010-10-13 LAB — DIFFERENTIAL
Basophils Absolute: 0.1 10*3/uL (ref 0.0–0.1)
Basophils Relative: 1 % (ref 0–1)
Eosinophils Absolute: 0.1 10*3/uL (ref 0.0–0.7)
Eosinophils Relative: 2 % (ref 0–5)
Lymphocytes Relative: 49 % — ABNORMAL HIGH (ref 12–46)
Lymphs Abs: 3.4 10*3/uL (ref 0.7–4.0)
Monocytes Absolute: 0.4 10*3/uL (ref 0.1–1.0)
Monocytes Relative: 5 % (ref 3–12)
Neutro Abs: 3 10*3/uL (ref 1.7–7.7)
Neutrophils Relative %: 43 % (ref 43–77)

## 2010-10-13 LAB — CBC
HCT: 41.7 % (ref 39.0–52.0)
Hemoglobin: 15.9 g/dL (ref 13.0–17.0)
MCH: 31 pg (ref 26.0–34.0)
MCHC: 38.1 g/dL — ABNORMAL HIGH (ref 30.0–36.0)
MCV: 81.3 fL (ref 78.0–100.0)
Platelets: 188 10*3/uL (ref 150–400)
RBC: 5.13 MIL/uL (ref 4.22–5.81)
RDW: 11.8 % (ref 11.5–15.5)
WBC: 7 10*3/uL (ref 4.0–10.5)

## 2010-10-13 LAB — LIPASE, BLOOD: Lipase: 39 U/L (ref 11–59)

## 2010-10-13 LAB — D-DIMER, QUANTITATIVE: D-Dimer, Quant: 0.53 ug/mL-FEU — ABNORMAL HIGH (ref 0.00–0.48)

## 2010-10-13 LAB — TROPONIN I: Troponin I: <0.3 ng/mL (ref <0.30)

## 2010-10-13 MED ORDER — IOHEXOL 300 MG/ML  SOLN
100.0000 mL | Freq: Once | INTRAMUSCULAR | Status: AC | PRN
  Administered 2010-10-13: 100 mL via INTRAVENOUS

## 2010-10-20 ENCOUNTER — Other Ambulatory Visit (HOSPITAL_COMMUNITY): Payer: Self-pay | Admitting: Gastroenterology

## 2010-10-20 DIAGNOSIS — R1011 Right upper quadrant pain: Secondary | ICD-10-CM

## 2010-10-21 ENCOUNTER — Encounter (HOSPITAL_COMMUNITY)
Admission: RE | Admit: 2010-10-21 | Discharge: 2010-10-21 | Disposition: A | Discharge status: Home / Self Care | Payer: 59 | Source: Ambulatory Visit | Attending: Gastroenterology | Admitting: Gastroenterology

## 2010-10-21 DIAGNOSIS — R1011 Right upper quadrant pain: Secondary | ICD-10-CM | POA: Insufficient documentation

## 2010-10-21 MED ORDER — TECHNETIUM TC 99M MEBROFENIN IV KIT
5.0000 | PACK | Freq: Once | INTRAVENOUS | Status: AC | PRN
Start: 2010-10-21 — End: 2010-10-21
  Administered 2010-10-21: 5 via INTRAVENOUS

## 2010-11-02 ENCOUNTER — Ambulatory Visit (INDEPENDENT_AMBULATORY_CARE_PROVIDER_SITE_OTHER): Payer: 59 | Admitting: Surgery

## 2010-11-02 ENCOUNTER — Encounter (INDEPENDENT_AMBULATORY_CARE_PROVIDER_SITE_OTHER): Payer: Self-pay | Admitting: Surgery

## 2010-11-02 VITALS — BP 150/78 | HR 88 | Ht 72.0 in | Wt 212.0 lb

## 2010-11-02 DIAGNOSIS — K828 Other specified diseases of gallbladder: Secondary | ICD-10-CM

## 2010-11-02 NOTE — Progress Notes (Signed)
Chief Complaint  Patient presents with  . Abdominal Pain    HPI Vincent [name] is a 45 y.o. male.  He has been referred here by his gastroenterologist. He has had ongoing problems with intermittent right upper quadrant abdominal pain for over a month. He was seen once in the emergency department and worked up. He had a negative gallbladder ultrasound, a normal EKG, and a negative chest CT at that time. Laboratory studies were unremarkable.  He has continued to have pain with associated nausea and occasional diarrhea. He has now had an endoscopy which was basically negative. He's also had a hepatobiliary scan which showed a marked delayed emptying of his gallbladder consistent with biliary dyskinesia. We were asked to see him to consider cholecystectomy.  He does note that his pain tends to be worse postprandially. Usually about 30 minutes after he eats. He does not have any particular food it causes him problems. HPI  Past Medical History  Diagnosis Date  . Diabetes mellitus   . COPD (chronic obstructive pulmonary disease)   . GERD (gastroesophageal reflux disease)   . Hypercholesteremia   . Neuropathy     legs/ feet    Past Surgical History  Procedure Date  . Rotator cuff repair 12/2004    right  . Carpal tunnel release 2003    right    Family History  Problem Relation Age of Onset  . Diabetes Mother   . Diabetes Father   . Hypertension Father   . Kidney disease Father     Social History History  Substance Use Topics  . Smoking status: Current Everyday Smoker -- 2.0 packs/day    Types: Cigarettes  . Smokeless tobacco: Not on file  . Alcohol Use: Yes     socially    Allergies  Allergen Reactions  . Celebrex (Celecoxib) Rash  . Sulfa Antibiotics Rash    Current Outpatient Prescriptions  Medication Sig Dispense Refill  . Coenzyme Q10 (CO Q 10) 10 MG CAPS Take by mouth.        . fish oil-omega-3 fatty acids 1000 MG capsule Take 2 g by mouth daily.        Marland Kitchen  FLUoxetine (PROZAC) 20 MG capsule Take 20 mg by mouth daily.        Marland Kitchen gabapentin (NEURONTIN) 100 MG tablet Take 100 mg by mouth 3 (three) times daily.        . metFORMIN (GLUMETZA) 1000 MG (MOD) 24 hr tablet Take 1,000 mg by mouth daily with breakfast.        . omeprazole (PRILOSEC) 20 MG capsule Take 20 mg by mouth daily.        . rosuvastatin (CRESTOR) 20 MG tablet Take 20 mg by mouth daily.        . simvastatin (ZOCOR) 40 MG tablet Take 40 mg by mouth at bedtime.          Review of Systems ROS I have gone over his past history family history and review of systems as they are noted and at that. Of note is that he is allergic to sulfa drugs. He is a two pack a day smoker although trying to quit. He has an elevated cholesterol and diabetes controlled with oral medications.  Blood pressure 150/78, pulse 88, height 6' (1.829 m), weight 212 lb (96.163 kg).  Physical Exam Physical Exam  GENERAL: The patient is alert, oriented, and generally healthy-appearing, NAD. Mood and affect are normal.  HEENT: The head is normocephalic, the eyes  nonicteric, the pupils were round regular and equal. EOMs are normal. Pharynx normal. Dentition good.  NECK: The neck is supple and there are no masses or thyromegaly.  LUNGS: Normal respirations and clear to auscultation.  HEART: Regular rhythm, with no murmurs rubs or gallops. Pulses are intact carotid dorsalis pedis and posterior tibial. No significant varicosities are noted.  ABDOMEN: Soft, flat, and nontender. No masses or organomegaly is noted. No hernias are noted. Bowel sounds are normal.  EXTREMITIES: Good range of motion, no edema.   Data Reviewed I have reviewed over the emergency department visit notes, all of his radiographic studies and laboratory studies, the notes from his gastroenterologist, and his endoscopy notes.  Assessment    Biliary dyskinesia    Plan    LC and IOC I have discussed the surgery with him including risks and  complications such as bleeding, infection, injury to other organs, bile duct injuries and bile leaks, and conversion to open. I think he understands and would like to proceed to surgery. I think all questions have been answered.       Rual Vermeer J 11/02/2010, 2:52 PM

## 2010-11-07 ENCOUNTER — Ambulatory Visit (HOSPITAL_COMMUNITY)
Admission: RE | Admit: 2010-11-07 | Discharge: 2010-11-07 | Disposition: A | Discharge status: Home / Self Care | Payer: 59 | Source: Ambulatory Visit | Attending: Surgery | Admitting: Surgery

## 2010-11-07 DIAGNOSIS — Z5309 Procedure and treatment not carried out because of other contraindication: Secondary | ICD-10-CM | POA: Insufficient documentation

## 2010-11-07 DIAGNOSIS — K828 Other specified diseases of gallbladder: Secondary | ICD-10-CM | POA: Insufficient documentation

## 2010-11-07 LAB — GLUCOSE, CAPILLARY: Glucose-Capillary: 541 mg/dL — ABNORMAL HIGH (ref 70–99)

## 2010-11-09 ENCOUNTER — Other Ambulatory Visit (HOSPITAL_COMMUNITY): Payer: 59

## 2010-11-09 ENCOUNTER — Encounter (HOSPITAL_COMMUNITY)
Admit: 2010-11-09 | Discharge: 2010-11-09 | Disposition: A | Discharge status: Home / Self Care | Payer: 59 | Attending: Surgery | Admitting: Surgery

## 2010-11-09 LAB — SURGICAL PCR SCREEN
MRSA, PCR: NEGATIVE
Staphylococcus aureus: POSITIVE — AB

## 2010-11-09 LAB — URINALYSIS, ROUTINE W REFLEX MICROSCOPIC
Bilirubin Urine: NEGATIVE
Glucose, UA: >1000 mg/dL — AB
Ketones, ur: 15 mg/dL — AB
Leukocytes, UA: NEGATIVE
Nitrite: NEGATIVE
Protein, ur: NEGATIVE mg/dL
Specific Gravity, Urine: 1.04 — ABNORMAL HIGH (ref 1.005–1.030)
Urobilinogen, UA: 1 mg/dL (ref 0.0–1.0)
pH: 5.5 (ref 5.0–8.0)

## 2010-11-09 LAB — CBC
HCT: 45.3 % (ref 39.0–52.0)
Hemoglobin: 16.9 g/dL (ref 13.0–17.0)
MCH: 30.4 pg (ref 26.0–34.0)
MCHC: 37.3 g/dL — ABNORMAL HIGH (ref 30.0–36.0)
MCV: 81.5 fL (ref 78.0–100.0)
Platelets: 231 10*3/uL (ref 150–400)
RBC: 5.56 MIL/uL (ref 4.22–5.81)
RDW: 12 % (ref 11.5–15.5)
WBC: 7.7 10*3/uL (ref 4.0–10.5)

## 2010-11-09 LAB — DIFFERENTIAL
Basophils Absolute: 0.1 10*3/uL (ref 0.0–0.1)
Basophils Relative: 1 % (ref 0–1)
Eosinophils Absolute: 0.2 10*3/uL (ref 0.0–0.7)
Eosinophils Relative: 2 % (ref 0–5)
Lymphocytes Relative: 40 % (ref 12–46)
Lymphs Abs: 3.1 10*3/uL (ref 0.7–4.0)
Monocytes Absolute: 0.4 10*3/uL (ref 0.1–1.0)
Monocytes Relative: 6 % (ref 3–12)
Neutro Abs: 4 10*3/uL (ref 1.7–7.7)
Neutrophils Relative %: 51 % (ref 43–77)

## 2010-11-09 LAB — COMPREHENSIVE METABOLIC PANEL
ALT: 25 U/L (ref 0–53)
AST: 20 U/L (ref 0–37)
Albumin: 4 g/dL (ref 3.5–5.2)
Alkaline Phosphatase: 81 U/L (ref 39–117)
BUN: 13 mg/dL (ref 6–23)
CO2: 29 mEq/L (ref 19–32)
Calcium: 9.5 mg/dL (ref 8.4–10.5)
Chloride: 97 mEq/L (ref 96–112)
Creatinine, Ser: 0.95 mg/dL (ref 0.50–1.35)
GFR calc Af Amer: >60 mL/min (ref >60)
GFR calc non Af Amer: >60 mL/min (ref >60)
Glucose, Bld: 303 mg/dL — ABNORMAL HIGH (ref 70–99)
Potassium: 4.2 mEq/L (ref 3.5–5.1)
Sodium: 134 mEq/L — ABNORMAL LOW (ref 135–145)
Total Bilirubin: 0.9 mg/dL (ref 0.3–1.2)
Total Protein: 6.8 g/dL (ref 6.0–8.3)

## 2010-11-09 LAB — URINE MICROSCOPIC-ADD ON

## 2010-11-16 ENCOUNTER — Encounter (INDEPENDENT_AMBULATORY_CARE_PROVIDER_SITE_OTHER): Payer: 59 | Admitting: Surgery

## 2010-11-17 ENCOUNTER — Ambulatory Visit (HOSPITAL_COMMUNITY): Payer: 59

## 2010-11-17 ENCOUNTER — Other Ambulatory Visit (INDEPENDENT_AMBULATORY_CARE_PROVIDER_SITE_OTHER): Payer: Self-pay | Admitting: Surgery

## 2010-11-17 ENCOUNTER — Encounter (INDEPENDENT_AMBULATORY_CARE_PROVIDER_SITE_OTHER): Payer: Self-pay | Admitting: Surgery

## 2010-11-17 ENCOUNTER — Ambulatory Visit (HOSPITAL_COMMUNITY)
Admission: RE | Admit: 2010-11-17 | Discharge: 2010-11-18 | Disposition: A | Discharge status: Home / Self Care | Payer: 59 | Source: Ambulatory Visit | Attending: Surgery | Admitting: Surgery

## 2010-11-17 DIAGNOSIS — J449 Chronic obstructive pulmonary disease, unspecified: Secondary | ICD-10-CM | POA: Insufficient documentation

## 2010-11-17 DIAGNOSIS — Z01812 Encounter for preprocedural laboratory examination: Secondary | ICD-10-CM | POA: Insufficient documentation

## 2010-11-17 DIAGNOSIS — F17200 Nicotine dependence, unspecified, uncomplicated: Secondary | ICD-10-CM | POA: Insufficient documentation

## 2010-11-17 DIAGNOSIS — K811 Chronic cholecystitis: Secondary | ICD-10-CM

## 2010-11-17 DIAGNOSIS — K219 Gastro-esophageal reflux disease without esophagitis: Secondary | ICD-10-CM | POA: Insufficient documentation

## 2010-11-17 DIAGNOSIS — E669 Obesity, unspecified: Secondary | ICD-10-CM | POA: Insufficient documentation

## 2010-11-17 DIAGNOSIS — K828 Other specified diseases of gallbladder: Secondary | ICD-10-CM | POA: Insufficient documentation

## 2010-11-17 DIAGNOSIS — E119 Type 2 diabetes mellitus without complications: Secondary | ICD-10-CM | POA: Insufficient documentation

## 2010-11-17 DIAGNOSIS — J4489 Other specified chronic obstructive pulmonary disease: Secondary | ICD-10-CM | POA: Insufficient documentation

## 2010-11-17 DIAGNOSIS — Z794 Long term (current) use of insulin: Secondary | ICD-10-CM | POA: Insufficient documentation

## 2010-11-17 HISTORY — PX: LAPAROSCOPIC CHOLECYSTECTOMY: SUR755

## 2010-11-17 LAB — GLUCOSE, CAPILLARY
Glucose-Capillary: 136 mg/dL — ABNORMAL HIGH (ref 70–99)
Glucose-Capillary: 164 mg/dL — ABNORMAL HIGH (ref 70–99)

## 2010-11-18 ENCOUNTER — Emergency Department (HOSPITAL_COMMUNITY)
Admission: EM | Admit: 2010-11-18 | Discharge: 2010-11-19 | Disposition: A | Discharge status: Home / Self Care | Payer: 59 | Attending: Emergency Medicine | Admitting: Emergency Medicine

## 2010-11-18 DIAGNOSIS — E1169 Type 2 diabetes mellitus with other specified complication: Secondary | ICD-10-CM | POA: Insufficient documentation

## 2010-11-18 DIAGNOSIS — J4489 Other specified chronic obstructive pulmonary disease: Secondary | ICD-10-CM | POA: Insufficient documentation

## 2010-11-18 DIAGNOSIS — R61 Generalized hyperhidrosis: Secondary | ICD-10-CM | POA: Insufficient documentation

## 2010-11-18 DIAGNOSIS — F29 Unspecified psychosis not due to a substance or known physiological condition: Secondary | ICD-10-CM | POA: Insufficient documentation

## 2010-11-18 DIAGNOSIS — F329 Major depressive disorder, single episode, unspecified: Secondary | ICD-10-CM | POA: Insufficient documentation

## 2010-11-18 DIAGNOSIS — F3289 Other specified depressive episodes: Secondary | ICD-10-CM | POA: Insufficient documentation

## 2010-11-18 DIAGNOSIS — J449 Chronic obstructive pulmonary disease, unspecified: Secondary | ICD-10-CM | POA: Insufficient documentation

## 2010-11-18 DIAGNOSIS — Z79899 Other long term (current) drug therapy: Secondary | ICD-10-CM | POA: Insufficient documentation

## 2010-11-18 DIAGNOSIS — R110 Nausea: Secondary | ICD-10-CM | POA: Insufficient documentation

## 2010-11-18 LAB — GLUCOSE, CAPILLARY: Glucose-Capillary: 222 mg/dL — ABNORMAL HIGH (ref 70–99)

## 2010-11-18 NOTE — Op Note (Signed)
Vincent [name], Vincent [name]               ACCOUNT NO.:  [id number]  MEDICAL RECORD NO.:  [id number]  LOCATION:  [facility]SC                         FACILITY:  [facility]  PHYSICIAN:  Currie Paris, M.D.DATE OF BIRTH:  May 21, 1965  DATE OF PROCEDURE:  11/17/2010 DATE OF DISCHARGE:                              OPERATIVE REPORT   PREOPERATIVE DIAGNOSIS:  Biliary dyskinesia with chronic cholecystitis.  POSTOPERATIVE DIAGNOSIS:  Biliary dyskinesia with chronic cholecystitis.  PROCEDURE:  Laparoscopic cholecystectomy with operative cholangiogram.  SURGEON:  Currie Paris, MD  ASSISTANT:  Mary Sella. Andrey Campanile, MD  ANESTHESIA:  General endotracheal.  CLINICAL HISTORY:  This is a 45 year old gentleman with biliary-type symptoms and negative gallbladder ultrasound, but had a layer of skin, which showed marked delayed emptying.  After discussion with the patient, he elected to proceed to laparoscopic cholecystectomy.  DESCRIPTION OF PROCEDURE:  The patient was seen in the holding area and had no further questions.  We confirmed the plans as noted above.  The patient was taken to the operating room and after satisfactory general endotracheal anesthesia had been obtained, the abdomen was prepped and draped, and the time-out was done.  0.25% plain Marcaine was used for each incision.  An umbilical incision was made, the fascia opened, and the peritoneal cavity entered under direct vision.  Hasson was introduced and the abdomen insufflated to 15.  The patient was placed in reverse Trendelenburg and tilted to the left. Under direct vision, a 10/11 trocar was placed in the epigastrium and two 5s laterally.  The gallbladder was noted to be somewhat distended with a few omental adhesions.  These were taken down.  The peritoneum overlying the cystic duct was opened and the triangle of Calot dissected and I got a nice window with a long segment of cystic duct and I could see the cystic artery as  well.  I put one clip on the cystic duct and another on what I thought might be a small branch of the cystic artery, but did not fully dissect out the cystic artery higher up.  Cook catheter was introduced and operative cholangiography done, which to me appeared normal.  The cystic duct catheter was removed and three clips were placed on the stay side of the cystic duct and it was divided.  The second clip was placed on the tissue that I thought might contain a small vessel and that was divided.  The cystic artery was then dissected out further, triple clipped, and divided leaving two on the stay side.  The gallbladder was removed from below to above.  The small vein clipped along the posterior aspect fairly high on the gallbladder bed.  There was minimal spillage of bile.  Once gallbladder was disconnected, I made sure the bed was dry.  The abdomen was irrigated.  The gallbladder was placed in a bag and brought out through the umbilical site.  A final check for hemostasis was made and a final irrigation.  Lateral ports were removed under direct vision.  There was no bleeding. The umbilical site was closed with a pursestring with a camera in the epigastrium and closed nicely.  The abdomen was deflated through the epigastric port.  All skin incisions were closed with 4-0 Monocryl, subcuticular, and Dermabond.  The patient tolerated the procedure well and there were no operative complications.  All counts were correct.     Currie Paris, M.D.     CJS/MEDQ  D:  11/17/2010  T:  11/17/2010  Job:  [id number]  cc:   Georgann Housekeeper, MD  Electronically Signed by Cyndia Bent M.D. on 11/18/2010 06:17:54 AM

## 2010-11-19 LAB — GLUCOSE, CAPILLARY: Glucose-Capillary: 259 mg/dL — ABNORMAL HIGH (ref 70–99)

## 2010-11-28 ENCOUNTER — Encounter (INDEPENDENT_AMBULATORY_CARE_PROVIDER_SITE_OTHER): Payer: Self-pay | Admitting: Surgery

## 2010-12-02 ENCOUNTER — Encounter (INDEPENDENT_AMBULATORY_CARE_PROVIDER_SITE_OTHER): Payer: 59 | Admitting: Surgery

## 2010-12-19 ENCOUNTER — Encounter (INDEPENDENT_AMBULATORY_CARE_PROVIDER_SITE_OTHER): Payer: Self-pay | Admitting: Surgery

## 2010-12-20 LAB — I-STAT 8, (EC8 V) (CONVERTED LAB)
Acid-Base Excess: 2
BUN: 12
Bicarbonate: 25.4 — ABNORMAL HIGH
Chloride: 106
Glucose, Bld: 97
HCT: 51
Hemoglobin: 17.3 — ABNORMAL HIGH
Operator id: 151321
Potassium: 4.6
Sodium: 137
TCO2: 27
pCO2, Ven: 37.1 — ABNORMAL LOW
pH, Ven: 7.444 — ABNORMAL HIGH

## 2010-12-20 LAB — DIFFERENTIAL
Basophils Absolute: 0
Basophils Relative: 1
Eosinophils Absolute: 0.1
Eosinophils Relative: 1
Lymphocytes Relative: 37
Lymphs Abs: 3.4 — ABNORMAL HIGH
Monocytes Absolute: 0.7
Monocytes Relative: 7
Neutro Abs: 4.9
Neutrophils Relative %: 54

## 2010-12-20 LAB — POCT I-STAT CREATININE
Creatinine, Ser: 0.9
Operator id: 151321

## 2010-12-20 LAB — HEMOGLOBIN A1C
Hgb A1c MFr Bld: 8.4 — ABNORMAL HIGH
Mean Plasma Glucose: 222

## 2010-12-20 LAB — CBC
HCT: 48.5
Hemoglobin: 17.2 — ABNORMAL HIGH
MCHC: 35.4
MCV: 85.7
Platelets: 338
RBC: 5.66
RDW: 12.6
WBC: 9.2

## 2010-12-20 LAB — PROTIME-INR
INR: 0.9
Prothrombin Time: 11.8

## 2010-12-20 LAB — HOMOCYSTEINE: Homocysteine: 10.3

## 2010-12-20 LAB — LIPID PANEL
Cholesterol: 224 — ABNORMAL HIGH
HDL: 25 — ABNORMAL LOW
LDL Cholesterol: 130 — ABNORMAL HIGH
Total CHOL/HDL Ratio: 9
Triglycerides: 345 — ABNORMAL HIGH
VLDL: 69 — ABNORMAL HIGH

## 2010-12-20 LAB — APTT: aPTT: 31

## 2011-08-01 ENCOUNTER — Encounter (HOSPITAL_COMMUNITY): Payer: Self-pay | Admitting: *Deleted

## 2011-08-01 ENCOUNTER — Encounter (HOSPITAL_COMMUNITY): Payer: Self-pay | Admitting: Physical Medicine and Rehabilitation

## 2011-08-01 ENCOUNTER — Emergency Department (HOSPITAL_COMMUNITY)
Admission: EM | Admit: 2011-08-01 | Discharge: 2011-08-01 | Disposition: A | Discharge status: Nursing Home (Includes ICF) | Payer: 59 | Source: Home / Self Care | Attending: Family Medicine | Admitting: Family Medicine

## 2011-08-01 ENCOUNTER — Emergency Department (HOSPITAL_COMMUNITY)
Admission: EM | Admit: 2011-08-01 | Discharge: 2011-08-01 | Disposition: A | Discharge status: Home / Self Care | Payer: 59 | Attending: Emergency Medicine | Admitting: Emergency Medicine

## 2011-08-01 ENCOUNTER — Emergency Department (HOSPITAL_COMMUNITY): Payer: 59

## 2011-08-01 DIAGNOSIS — R42 Dizziness and giddiness: Secondary | ICD-10-CM | POA: Insufficient documentation

## 2011-08-01 DIAGNOSIS — J329 Chronic sinusitis, unspecified: Secondary | ICD-10-CM

## 2011-08-01 DIAGNOSIS — F17200 Nicotine dependence, unspecified, uncomplicated: Secondary | ICD-10-CM | POA: Insufficient documentation

## 2011-08-01 DIAGNOSIS — Z79899 Other long term (current) drug therapy: Secondary | ICD-10-CM | POA: Insufficient documentation

## 2011-08-01 DIAGNOSIS — Z794 Long term (current) use of insulin: Secondary | ICD-10-CM | POA: Insufficient documentation

## 2011-08-01 DIAGNOSIS — E119 Type 2 diabetes mellitus without complications: Secondary | ICD-10-CM | POA: Insufficient documentation

## 2011-08-01 DIAGNOSIS — E7800 Pure hypercholesterolemia, unspecified: Secondary | ICD-10-CM | POA: Insufficient documentation

## 2011-08-01 DIAGNOSIS — H538 Other visual disturbances: Secondary | ICD-10-CM

## 2011-08-01 DIAGNOSIS — R7309 Other abnormal glucose: Secondary | ICD-10-CM

## 2011-08-01 DIAGNOSIS — R51 Headache: Secondary | ICD-10-CM

## 2011-08-01 DIAGNOSIS — R209 Unspecified disturbances of skin sensation: Secondary | ICD-10-CM | POA: Insufficient documentation

## 2011-08-01 DIAGNOSIS — R739 Hyperglycemia, unspecified: Secondary | ICD-10-CM

## 2011-08-01 LAB — CBC
HCT: 45.8 % (ref 39.0–52.0)
Hemoglobin: 16.7 g/dL (ref 13.0–17.0)
MCH: 30.8 pg (ref 26.0–34.0)
MCHC: 36.5 g/dL — ABNORMAL HIGH (ref 30.0–36.0)
MCV: 84.3 fL (ref 78.0–100.0)
Platelets: 235 10*3/uL (ref 150–400)
RBC: 5.43 MIL/uL (ref 4.22–5.81)
RDW: 12.3 % (ref 11.5–15.5)
WBC: 8.3 10*3/uL (ref 4.0–10.5)

## 2011-08-01 LAB — URINALYSIS, ROUTINE W REFLEX MICROSCOPIC
Bilirubin Urine: NEGATIVE
Glucose, UA: >1000 mg/dL — AB
Hgb urine dipstick: NEGATIVE
Ketones, ur: NEGATIVE mg/dL
Leukocytes, UA: NEGATIVE
Nitrite: NEGATIVE
Protein, ur: NEGATIVE mg/dL
Specific Gravity, Urine: 1.044 — ABNORMAL HIGH (ref 1.005–1.030)
Urobilinogen, UA: 0.2 mg/dL (ref 0.0–1.0)
pH: 5.5 (ref 5.0–8.0)

## 2011-08-01 LAB — GLUCOSE, CAPILLARY
Glucose-Capillary: 400 mg/dL — ABNORMAL HIGH (ref 70–99)
Glucose-Capillary: 337 mg/dL — ABNORMAL HIGH (ref 70–99)

## 2011-08-01 LAB — COMPREHENSIVE METABOLIC PANEL
ALT: 26 U/L (ref 0–53)
AST: 27 U/L (ref 0–37)
Albumin: 3.8 g/dL (ref 3.5–5.2)
Alkaline Phosphatase: 65 U/L (ref 39–117)
BUN: 12 mg/dL (ref 6–23)
CO2: 25 mEq/L (ref 19–32)
Calcium: 9.2 mg/dL (ref 8.4–10.5)
Chloride: 99 mEq/L (ref 96–112)
Creatinine, Ser: 0.64 mg/dL (ref 0.50–1.35)
GFR calc Af Amer: >90 mL/min (ref >90)
GFR calc non Af Amer: >90 mL/min (ref >90)
Glucose, Bld: 359 mg/dL — ABNORMAL HIGH (ref 70–99)
Potassium: 4.5 mEq/L (ref 3.5–5.1)
Sodium: 135 mEq/L (ref 135–145)
Total Bilirubin: 0.5 mg/dL (ref 0.3–1.2)
Total Protein: 6.7 g/dL (ref 6.0–8.3)

## 2011-08-01 LAB — DIFFERENTIAL
Basophils Absolute: 0.1 10*3/uL (ref 0.0–0.1)
Basophils Relative: 1 % (ref 0–1)
Eosinophils Absolute: 0.2 10*3/uL (ref 0.0–0.7)
Eosinophils Relative: 3 % (ref 0–5)
Lymphocytes Relative: 45 % (ref 12–46)
Lymphs Abs: 3.8 10*3/uL (ref 0.7–4.0)
Monocytes Absolute: 0.5 10*3/uL (ref 0.1–1.0)
Monocytes Relative: 6 % (ref 3–12)
Neutro Abs: 3.8 10*3/uL (ref 1.7–7.7)
Neutrophils Relative %: 46 % (ref 43–77)

## 2011-08-01 LAB — URINE MICROSCOPIC-ADD ON

## 2011-08-01 MED ORDER — AMOXICILLIN-POT CLAVULANATE 875-125 MG PO TABS: 1.0000 | ORAL_TABLET | Freq: Two times a day (BID) | ORAL | Status: AC

## 2011-08-01 NOTE — ED Notes (Signed)
Pt presents to department from [facility] for evaluation of "burning" numbness sensation to L side of face. Ongoing x9 days. Also states headaches and periods of confusion lasting 2-43min at a time. Decreased sensation to L side of face. Speech clear, equal bilateral grips, able to move all extremities. He is conscious alert and oriented x4. 4/10 headache at the time.

## 2011-08-01 NOTE — ED Notes (Signed)
Pt. Back from MRI

## 2011-08-01 NOTE — Discharge Instructions (Signed)
Followup with her primary care Dr. for recheck.  Return here as needed, for any worsening in condition.  Increase your fluid intake

## 2011-08-01 NOTE — ED Provider Notes (Signed)
Medical screening examination/treatment/procedure(s) were performed by non-physician practitioner and as supervising physician I was immediately available for consultation/collaboration.  Ethelda Chick, MD 08/01/11 [id number]

## 2011-08-01 NOTE — ED Notes (Signed)
Pt  Is  A  Diabetic      Who  For  The  Last  9  Days  Has  Been  Having  Symptoms   Of  intermittant  Numbness to  l  Side  Face       At  Intervals        Pt  Reports  His  Sugars  Have  Been running  High  As  Well    Have  Been  Running  High   As  Well     And   But  He  Has   Been taking his  meds  He  Reports     He  Reports  intermittant  Periods  of dizzyness  As  Well  - he reports           Headache        And  l  Arm  Pain       As  Well     -  At  This  Time  The      Pt  Is       Awake  And  Alert  And  Oriented      Family  Member is  At  Bedside

## 2011-08-01 NOTE — ED Provider Notes (Signed)
History     CSN: [id number]  Arrival date & time 08/01/11  1643   First MD Initiated Contact with Patient 08/01/11 1659      Chief Complaint  Patient presents with  . Headache    (Consider location/radiation/quality/duration/timing/severity/associated sxs/prior treatment) HPI Comments: 46 y/o smoker diabetic male also with history of COPD. Here complaining of burning-like pain in the left side of his face and scalp for about 9 days symptoms associated with decreased sensation in the skin of the left side of his face and decreased vision from the left eye from baseline and difficulty with balance, symptoms has been constant for the last 9 days patient denies symptom progression.  Patient also reports intermittent pain i left upper arm, and wife has noted patient has intermittent episodes with altered mentation, described as absence/confusion with lateralization of the mouth towards the left side, episodes lasting 2-5 min minutes. Reports compliance with diabetes medications but home CBG's consistently above 300's during last 2 weeks. PCP Dr. Donette Larry at [place] [facility].    Past Medical History  Diagnosis Date  . Diabetes mellitus   . COPD (chronic obstructive pulmonary disease)   . GERD (gastroesophageal reflux disease)   . Hypercholesteremia   . Neuropathy     legs/ feet  . Biliary dyskinesia 11/02/2010    Past Surgical History  Procedure Date  . Rotator cuff repair 12/2004    right  . Carpal tunnel release 2003    right  . Laparoscopic cholecystectomy w/ cholangiography 11/17/2010    Dr Jamey Ripa    Family History  Problem Relation Age of Onset  . Diabetes Mother   . Diabetes Father   . Hypertension Father   . Kidney disease Father     History  Substance Use Topics  . Smoking status: Current Everyday Smoker -- 2.0 packs/day    Types: Cigarettes  . Smokeless tobacco: Not on file  . Alcohol Use: Yes     socially      Review of Systems  Constitutional: Negative for fever and  chills.  HENT: Negative for sore throat, trouble swallowing, neck pain and neck stiffness.   Eyes: Positive for visual disturbance.  Respiratory: Negative for shortness of breath.   Cardiovascular: Negative for chest pain.  Gastrointestinal: Negative for nausea, vomiting and abdominal pain.  Genitourinary: Negative for dysuria.  Skin: Negative for rash.  Neurological: Positive for dizziness, numbness and headaches. Negative for seizures, speech difficulty and weakness.    Allergies  Celebrex and Sulfa antibiotics  Home Medications   Current Outpatient Rx  Name Route Sig Dispense Refill  . CO Q 10 10 MG PO CAPS Oral Take by mouth.      . OMEGA-3 FATTY ACIDS 1000 MG PO CAPS Oral Take 2 g by mouth daily.      Marland Kitchen FLUOXETINE HCL 20 MG PO CAPS Oral Take 20 mg by mouth daily.      Marland Kitchen GABAPENTIN 100 MG PO TABS Oral Take 100 mg by mouth 3 (three) times daily.      Marland Kitchen METFORMIN HCL ER (MOD) 1000 MG PO TB24 Oral Take 1,000 mg by mouth daily with breakfast.      . OMEPRAZOLE 20 MG PO CPDR Oral Take 20 mg by mouth daily.      Marland Kitchen ROSUVASTATIN CALCIUM 20 MG PO TABS Oral Take 20 mg by mouth daily.      Marland Kitchen SIMVASTATIN 40 MG PO TABS Oral Take 40 mg by mouth at bedtime.  BP 115/67  Pulse 97  Temp(Src) 98.4 F (36.9 C) (Oral)  Resp 18  SpO2 96%  Physical Exam  Nursing note and vitals reviewed. Constitutional: He is oriented to person, place, and time. He appears well-developed and well-nourished. No distress.  HENT:  Head: Normocephalic and atraumatic.  Right Ear: External ear normal.  Left Ear: External ear normal.  Mouth/Throat: Oropharynx is clear and moist. No oropharyngeal exudate.       Absent dentition. Gums OK  Eyes: Conjunctivae and EOM are normal. Pupils are equal, round, and reactive to light. No scleral icterus.  Neck: Normal range of motion. Neck supple.  Cardiovascular: Normal rate, regular rhythm and normal heart sounds.  Exam reveals no gallop and no friction rub.   No  murmur heard. Pulmonary/Chest: Effort normal.       Mild prolonged expiration with scattered expiratory rhonchi bilaterally. No active wheezing. No tachypnea or orthopnea.   Abdominal: Soft. There is no tenderness.  Neurological: He is alert and oriented to person, place, and time. He has normal strength and normal reflexes. A sensory deficit is present. No cranial nerve deficit. He displays a negative Romberg sign. Coordination and gait normal.       Reported decreased sensation to touch in left side of face compared with right.  Tongue is central.  Visual fields appear conserved compared with mine.   Skin: No rash noted.    ED Course  Procedures (including critical care time)  Labs Reviewed  GLUCOSE, CAPILLARY - Abnormal; Notable for the following:    Glucose-Capillary 400 (*)    All other components within normal limits   No results found.   1. Hyperglycemia   2. Headache   3. Blurry vision, left eye       MDM  46 y/o smoker male with h/o COPD and DM. Here complaining of persistent burning-like pain and numbness in the left side of his face and scalp, decreased vision from the left eye and balance problems for 9 days.  Here noted hyperglycemic with a point-of-care CBG about 400. EKG with normal sinus rhythm rate 81 and no ischemic changes. On exam: vital signs stable, normal neuro examination other than the decrease superficial sensation in the left side of the face. Lungs with mild prolonged expiration and scattered  expiratory wheezing.  It is possible this patient had a stroke  9 days ago versus symptoms related to poorly controlled diabetes. Evolution of nonprogressive symptoms and findings on examination not suggestive of stroke in process, thus stroke code will not be activated and patient will be transferred to the emergency department for further evaluation and management via shuttle.         Sharin Grave, MD 08/02/11 1116

## 2011-08-01 NOTE — ED Notes (Addendum)
Pt c/o of left sided headache with tingling that goes down left arm. Stroke scale negative, however he does have hx of TIA in 2008. Reports having trouble with his gait. And blurred vision in left eye with pressure behind eye. Also states he get confused at times

## 2011-08-01 NOTE — ED Notes (Signed)
CBG 337 

## 2011-08-01 NOTE — ED Notes (Signed)
Pt. To MRI

## 2011-08-01 NOTE — ED Provider Notes (Signed)
History     CSN: [id number]  Arrival date & time 08/01/11  1755   First MD Initiated Contact with Patient 08/01/11 1836      Chief Complaint  Patient presents with  . Numbness  . Headache    (Consider location/radiation/quality/duration/timing/severity/associated sxs/prior treatment) HPI Patient presents emergency department with a nine-day history of intermittent tingling to his left face.  He also states that he felt some dizziness, ringing in his left ear along with discomfort, patient, states he has had a frontal headache, as well.  Patient denies visual changes, vertigo, weakness, nausea, vomiting, abdominal pain, fever, or gait disturbance.  Patient, states he did not see anybody prior to this, other than the urgent care.  Past Medical History  Diagnosis Date  . Diabetes mellitus   . COPD (chronic obstructive pulmonary disease)   . GERD (gastroesophageal reflux disease)   . Hypercholesteremia   . Neuropathy     legs/ feet  . Biliary dyskinesia 11/02/2010    Past Surgical History  Procedure Date  . Rotator cuff repair 12/2004    right  . Carpal tunnel release 2003    right  . Laparoscopic cholecystectomy w/ cholangiography 11/17/2010    Dr Jamey Ripa    Family History  Problem Relation Age of Onset  . Diabetes Mother   . Diabetes Father   . Hypertension Father   . Kidney disease Father     History  Substance Use Topics  . Smoking status: Current Everyday Smoker -- 2.0 packs/day    Types: Cigarettes  . Smokeless tobacco: Not on file  . Alcohol Use: Yes     socially      Review of Systems All other systems negative except as documented in the HPI. All pertinent positives and negatives as reviewed in the HPI.  Allergies  Celebrex and Sulfa antibiotics  Home Medications   Current Outpatient Rx  Name Route Sig Dispense Refill  . ATORVASTATIN CALCIUM 40 MG PO TABS Oral Take 40 mg by mouth daily.    . CO Q 10 10 MG PO CAPS Oral Take 1 tablet by mouth  daily.     . OMEGA-3 FATTY ACIDS 1000 MG PO CAPS Oral Take 3 g by mouth daily.     Marland Kitchen FLUOXETINE HCL 40 MG PO CAPS Oral Take 40 mg by mouth daily.    Marland Kitchen GABAPENTIN 100 MG PO TABS Oral Take 100 mg by mouth 3 (three) times daily.      . INSULIN ASPART PROT & ASPART (70-30) 100 UNIT/ML [place] SUSP Subcutaneous Inject 60 Units into the skin 2 (two) times daily. 60 units in the am and at bedtime    . METFORMIN HCL 1000 MG PO TABS Oral Take 1,000 mg by mouth 2 (two) times daily with a meal.    . OMEPRAZOLE 20 MG PO CPDR Oral Take 20 mg by mouth daily.      Marland Kitchen SIMVASTATIN 40 MG PO TABS Oral Take 40 mg by mouth at bedtime.      Marland Kitchen SITAGLIPTIN PHOSPHATE 50 MG PO TABS Oral Take 50 mg by mouth daily.      BP 113/68  Pulse 78  Temp(Src) 98.3 F (36.8 C) (Oral)  Resp 16  SpO2 97%  Physical Exam  Constitutional: He is oriented to person, place, and time. He appears well-developed and well-nourished.  HENT:  Head: Normocephalic and atraumatic.  Mouth/Throat: Oropharynx is clear and moist. No oropharyngeal exudate.  Eyes: EOM are normal. Pupils are equal, round, and  reactive to light.  Neck: Normal range of motion. Neck supple.  Cardiovascular: Normal rate, regular rhythm and normal heart sounds.  Exam reveals no gallop and no friction rub.   No murmur heard. Pulmonary/Chest: Effort normal.  Neurological: He is alert and oriented to person, place, and time. He has normal strength and normal reflexes. No sensory deficit. Coordination and gait normal.  Reflex Scores:      Tricep reflexes are 2+ on the right side and 2+ on the left side.      Bicep reflexes are 2+ on the right side and 2+ on the left side.      Brachioradialis reflexes are 2+ on the right side and 2+ on the left side.      Patellar reflexes are 2+ on the right side and 2+ on the left side.      Achilles reflexes are 2+ on the right side and 2+ on the left side.   ED Course  Procedures (including critical care time)  Labs Reviewed    GLUCOSE, CAPILLARY - Abnormal; Notable for the following:    Glucose-Capillary 337 (*)    All other components within normal limits  COMPREHENSIVE METABOLIC PANEL - Abnormal; Notable for the following:    Glucose, Bld 359 (*)    All other components within normal limits  CBC - Abnormal; Notable for the following:    MCHC 36.5 (*)    All other components within normal limits  URINALYSIS, ROUTINE W REFLEX MICROSCOPIC - Abnormal; Notable for the following:    Specific Gravity, Urine 1.044 (*)    Glucose, UA >1000 (*)    All other components within normal limits  DIFFERENTIAL  URINE MICROSCOPIC-ADD ON   Mr Brain Wo Contrast  08/01/2011  *RADIOLOGY REPORT*  Clinical Data: Burning like sensation left side of face and skull for past 9 days.  Left-sided facial numbness.  Decreased vision left eye.  Difficulty with balance.  Diabetic. Hypercholesterolemia.  MRI HEAD WITHOUT CONTRAST  Technique:  Multiplanar, multiecho pulse sequences of the brain and surrounding structures were obtained according to standard protocol without intravenous contrast.  Comparison: 02/13/2008 head CT.  01/21/2007 brain MR.  Findings: No acute infarct.  No intracranial hemorrhage.  No intracranial mass lesion noted on this unenhanced exam.  Minimal nonspecific white matter type changes.  No hydrocephalus.  No cisternal compression of the fifth cranial nerve root.  Frontal and ethmoid sinus air cell mild mucosal thickening with minimal maxillary sinus mucosal thickening.  Major intracranial vascular structures are patent.  IMPRESSION: No acute infarct.  Frontal and ethmoid sinus air cell mild mucosal thickening with minimal maxillary sinus mucosal thickening.  Original Report Authenticated By: Fuller Canada, M.D.     Patient states he has had some tenderness to his sinus regions, and pressure, as well as the previous symptoms mentioned above.  Patient, states he feels fine at this time and just relieved.  There is no  abnormalities on MRI of the ones noted.  The patient will be referred back to his primary care Dr. for further and recheck.  Told to return here as needed.   MDM  MDM Reviewed: nursing note and vitals Interpretation: labs and MRI           Carlyle Dolly, PA-C 08/01/11 2251  Carlyle Dolly, PA-C 08/01/11 2251

## 2012-02-28 ENCOUNTER — Encounter (HOSPITAL_COMMUNITY): Payer: Self-pay | Admitting: *Deleted

## 2012-02-28 ENCOUNTER — Emergency Department (HOSPITAL_COMMUNITY): Payer: 59

## 2012-02-28 ENCOUNTER — Emergency Department (HOSPITAL_COMMUNITY)
Admission: EM | Admit: 2012-02-28 | Discharge: 2012-02-28 | Disposition: A | Discharge status: Home / Self Care | Payer: 59 | Attending: Emergency Medicine | Admitting: Emergency Medicine

## 2012-02-28 DIAGNOSIS — E7800 Pure hypercholesterolemia, unspecified: Secondary | ICD-10-CM | POA: Insufficient documentation

## 2012-02-28 DIAGNOSIS — Z87442 Personal history of urinary calculi: Secondary | ICD-10-CM | POA: Insufficient documentation

## 2012-02-28 DIAGNOSIS — M549 Dorsalgia, unspecified: Secondary | ICD-10-CM

## 2012-02-28 DIAGNOSIS — J449 Chronic obstructive pulmonary disease, unspecified: Secondary | ICD-10-CM | POA: Insufficient documentation

## 2012-02-28 DIAGNOSIS — F17200 Nicotine dependence, unspecified, uncomplicated: Secondary | ICD-10-CM | POA: Insufficient documentation

## 2012-02-28 DIAGNOSIS — J4489 Other specified chronic obstructive pulmonary disease: Secondary | ICD-10-CM | POA: Insufficient documentation

## 2012-02-28 DIAGNOSIS — Z8669 Personal history of other diseases of the nervous system and sense organs: Secondary | ICD-10-CM | POA: Insufficient documentation

## 2012-02-28 DIAGNOSIS — Z79899 Other long term (current) drug therapy: Secondary | ICD-10-CM | POA: Insufficient documentation

## 2012-02-28 DIAGNOSIS — K219 Gastro-esophageal reflux disease without esophagitis: Secondary | ICD-10-CM | POA: Insufficient documentation

## 2012-02-28 DIAGNOSIS — E119 Type 2 diabetes mellitus without complications: Secondary | ICD-10-CM | POA: Insufficient documentation

## 2012-02-28 DIAGNOSIS — M545 Low back pain, unspecified: Secondary | ICD-10-CM | POA: Insufficient documentation

## 2012-02-28 DIAGNOSIS — Z794 Long term (current) use of insulin: Secondary | ICD-10-CM | POA: Insufficient documentation

## 2012-02-28 LAB — COMPREHENSIVE METABOLIC PANEL
ALT: 27 U/L (ref 0–53)
AST: 17 U/L (ref 0–37)
Albumin: 4 g/dL (ref 3.5–5.2)
Alkaline Phosphatase: 74 U/L (ref 39–117)
BUN: 12 mg/dL (ref 6–23)
CO2: 25 mEq/L (ref 19–32)
Calcium: 9.8 mg/dL (ref 8.4–10.5)
Chloride: 98 mEq/L (ref 96–112)
Creatinine, Ser: 0.59 mg/dL (ref 0.50–1.35)
GFR calc Af Amer: >90 mL/min (ref >90)
GFR calc non Af Amer: >90 mL/min (ref >90)
Glucose, Bld: 349 mg/dL — ABNORMAL HIGH (ref 70–99)
Potassium: 4.3 mEq/L (ref 3.5–5.1)
Sodium: 136 mEq/L (ref 135–145)
Total Bilirubin: 0.9 mg/dL (ref 0.3–1.2)
Total Protein: 7.2 g/dL (ref 6.0–8.3)

## 2012-02-28 LAB — URINALYSIS, ROUTINE W REFLEX MICROSCOPIC
Bilirubin Urine: NEGATIVE
Glucose, UA: >1000 mg/dL — AB
Hgb urine dipstick: NEGATIVE
Ketones, ur: NEGATIVE mg/dL
Leukocytes, UA: NEGATIVE
Nitrite: NEGATIVE
Protein, ur: NEGATIVE mg/dL
Specific Gravity, Urine: 1.035 — ABNORMAL HIGH (ref 1.005–1.030)
Urobilinogen, UA: 0.2 mg/dL (ref 0.0–1.0)
pH: 6 (ref 5.0–8.0)

## 2012-02-28 LAB — CBC WITH DIFFERENTIAL/PLATELET
Basophils Absolute: 0.1 10*3/uL (ref 0.0–0.1)
Basophils Relative: 1 % (ref 0–1)
Eosinophils Absolute: 0.7 10*3/uL (ref 0.0–0.7)
Eosinophils Relative: 8 % — ABNORMAL HIGH (ref 0–5)
HCT: 45.4 % (ref 39.0–52.0)
Hemoglobin: 16.8 g/dL (ref 13.0–17.0)
Lymphocytes Relative: 33 % (ref 12–46)
Lymphs Abs: 3.1 10*3/uL (ref 0.7–4.0)
MCH: 30.3 pg (ref 26.0–34.0)
MCHC: 37 g/dL — ABNORMAL HIGH (ref 30.0–36.0)
MCV: 81.9 fL (ref 78.0–100.0)
Monocytes Absolute: 0.7 10*3/uL (ref 0.1–1.0)
Monocytes Relative: 8 % (ref 3–12)
Neutro Abs: 4.7 10*3/uL (ref 1.7–7.7)
Neutrophils Relative %: 50 % (ref 43–77)
Platelets: 267 10*3/uL (ref 150–400)
RBC: 5.54 MIL/uL (ref 4.22–5.81)
RDW: 12.2 % (ref 11.5–15.5)
WBC: 9.3 10*3/uL (ref 4.0–10.5)

## 2012-02-28 LAB — URINE MICROSCOPIC-ADD ON

## 2012-02-28 MED ORDER — HYDROMORPHONE HCL PF 1 MG/ML IJ SOLN
1.0000 mg | Freq: Once | INTRAMUSCULAR | Status: AC
  Administered 2012-02-28: 1 mg via INTRAVENOUS
  Filled 2012-02-28: qty 1

## 2012-02-28 MED ORDER — ONDANSETRON HCL 4 MG/2ML IJ SOLN
4.0000 mg | Freq: Once | INTRAMUSCULAR | Status: AC
  Administered 2012-02-28: 4 mg via INTRAVENOUS
  Filled 2012-02-28: qty 2

## 2012-02-28 MED ORDER — SODIUM CHLORIDE 0.9 % IV SOLN: INTRAVENOUS | Status: DC

## 2012-02-28 MED ORDER — TRAMADOL HCL 50 MG PO TABS: 50.0000 mg | ORAL_TABLET | Freq: Four times a day (QID) | ORAL | Status: DC | PRN

## 2012-02-28 MED ORDER — SODIUM CHLORIDE 0.9 % IV SOLN
INTRAVENOUS | Status: DC
  Administered 2012-02-28: 13:00:00 via INTRAVENOUS

## 2012-02-28 MED ORDER — FENTANYL CITRATE 0.05 MG/ML IJ SOLN
50.0000 ug | Freq: Once | INTRAMUSCULAR | Status: AC
  Administered 2012-02-28: 50 ug via NASAL
  Filled 2012-02-28: qty 2

## 2012-02-28 NOTE — ED Notes (Signed)
Pt given d/c teaching and prescription; pt has no further questions upon d/c. Pt does not appear to be in acute distress upon d/c.

## 2012-02-28 NOTE — ED Notes (Signed)
Pt states pain started about 4 days ago and pain became worse this morning. Pt states has hx of kidney stones when he was younger. Pt mentating appropriately. Pt denies nausea and vomiting.

## 2012-02-28 NOTE — ED Provider Notes (Signed)
History     CSN: [id number]  Arrival date & time 02/28/12  1020   First MD Initiated Contact with Patient 02/28/12 1137      Chief Complaint  Patient presents with  . Flank Pain  . Groin Pain    (Consider location/radiation/quality/duration/timing/severity/associated sxs/prior treatment) HPI Comments: Vincent [name] is a 46 y.o. Male who developed right flank pain. 4 days ago. It waxes and wanes, but is persistent. No associated symptoms including fever, chills, nausea, vomiting, hematuria, dysuria, or change in bowels. He has been able to eat. No known trauma. Similar pain in the past when he had a stone. He does not see a urologist, regularly. There are no modifying factors  Patient is a 46 y.o. male presenting with flank pain and groin pain. The history is provided by the patient and the spouse.  Flank Pain  Groin Pain    Past Medical History  Diagnosis Date  . Diabetes mellitus   . COPD (chronic obstructive pulmonary disease)   . GERD (gastroesophageal reflux disease)   . Hypercholesteremia   . Neuropathy     legs/ feet  . Biliary dyskinesia 11/02/2010  . Renal stones     Past Surgical History  Procedure Date  . Rotator cuff repair 12/2004    right  . Carpal tunnel release 2003    right  . Laparoscopic cholecystectomy w/ cholangiography 11/17/2010    Dr Jamey Ripa    Family History  Problem Relation Age of Onset  . Diabetes Mother   . Diabetes Father   . Hypertension Father   . Kidney disease Father     History  Substance Use Topics  . Smoking status: Current Every Day Smoker -- 2.0 packs/day    Types: Cigarettes  . Smokeless tobacco: Not on file  . Alcohol Use: Yes     Comment: socially      Review of Systems  Genitourinary: Positive for flank pain.  All other systems reviewed and are negative.    Allergies  Celebrex; Hydrocodone-acetaminophen; and Sulfa antibiotics  Home Medications   Current Outpatient Rx  Name  Route  Sig  Dispense   Refill  . ATORVASTATIN CALCIUM 40 MG PO TABS   Oral   Take 40 mg by mouth daily.         Marland Kitchen VITAMIN D 1000 UNITS PO TABS   Oral   Take 1,000 Units by mouth daily.         . CO Q 10 10 MG PO CAPS   Oral   Take 1 tablet by mouth daily.          . DULOXETINE HCL 20 MG PO CPEP   Oral   Take 20 mg by mouth daily.         . OMEGA-3 FATTY ACIDS 1000 MG PO CAPS   Oral   Take 1,000-2,000 mg by mouth 2 (two) times daily. Take 1 capsule in the morning and 2 capsules in the evening         . GABAPENTIN 300 MG PO CAPS   Oral   Take 300 mg by mouth 4 (four) times daily.         . INSULIN LISPRO PROT & LISPRO (75-25) 100 UNIT/ML [place] SUSP   Subcutaneous   Inject 20-60 Units into the skin 3 (three) times daily before meals. Take 60 units in the morning and with dinner and 20 units at lunch         . METFORMIN HCL 1000  MG PO TABS   Oral   Take 1,000 mg by mouth 2 (two) times daily with a meal.         . OMEPRAZOLE 20 MG PO CPDR   Oral   Take 20 mg by mouth daily.           Marland Kitchen SITAGLIPTIN PHOSPHATE 50 MG PO TABS   Oral   Take 50 mg by mouth daily.         Marland Kitchen VITAMIN B-12 1000 MCG PO TABS   Oral   Take 1,000 mcg by mouth daily.         . TRAMADOL HCL 50 MG PO TABS   Oral   Take 1 tablet (50 mg total) by mouth every 6 (six) hours as needed for pain.   30 tablet   0     BP 136/74  Pulse 86  Temp 97.7 F (36.5 C) (Oral)  Resp 16  SpO2 97%  Physical Exam  Nursing note and vitals reviewed. Constitutional: He is oriented to person, place, and time. He appears well-developed and well-nourished.  HENT:  Head: Normocephalic and atraumatic.  Right Ear: External ear normal.  Left Ear: External ear normal.  Eyes: Conjunctivae normal and EOM are normal. Pupils are equal, round, and reactive to light.  Neck: Normal range of motion and phonation normal. Neck supple.  Cardiovascular: Normal rate, regular rhythm, normal heart sounds and intact distal pulses.    Pulmonary/Chest: Effort normal and breath sounds normal. He exhibits no bony tenderness.  Abdominal: Soft. Normal appearance. There is no tenderness.  Genitourinary:       No convincing costovertebral angle tenderness, to percussion  Musculoskeletal: Normal range of motion.       Mild right lower back pain to palpation. Normal active range of motion of the back.  Neurological: He is alert and oriented to person, place, and time. He has normal strength. No cranial nerve deficit or sensory deficit. He exhibits normal muscle tone. Coordination normal.  Skin: Skin is warm, dry and intact.  Psychiatric: He has a normal mood and affect. His behavior is normal. Judgment and thought content normal.    ED Course  Procedures (including critical care time)  Labs Reviewed  URINALYSIS, ROUTINE W REFLEX MICROSCOPIC - Abnormal; Notable for the following:    Specific Gravity, Urine 1.035 (*)     Glucose, UA >1000 (*)     All other components within normal limits  CBC WITH DIFFERENTIAL - Abnormal; Notable for the following:    MCHC 37.0 (*)     Eosinophils Relative 8 (*)     All other components within normal limits  COMPREHENSIVE METABOLIC PANEL - Abnormal; Notable for the following:    Glucose, Bld 349 (*)     All other components within normal limits  URINE MICROSCOPIC-ADD ON  LAB REPORT - SCANNED   Nursing notes, applicable records and vitals reviewed.  Radiologic Images/Reports reviewed.    1. Back pain       MDM  Nonspecific low back pain. Imaging negative for urolithiasis. I reviewed the CT image, and it does not appear to include any evidence for significant degenerative joint disease of the lumbar spine. His pain is likely muscular caused. Doubt metabolic instability, serious bacterial infection or impending vascular collapse; the patient is stable for discharge.   Plan: Home Medications- Ultram; Home Treatments- rest, heat; Recommended follow up-  PCP prn     Flint Melter, MD 02/29/12 [phone]

## 2012-02-28 NOTE — ED Notes (Signed)
PT reports an onset of flank pain and rt groin pain that started 4 days ago. Pt is diabetic CBG 306 in triage. PT has a Hx of renal stones.

## 2012-02-28 NOTE — ED Notes (Signed)
Pt transported to CT; transporter given ticket to ride and notified about dilaudid given.

## 2012-02-29 LAB — GLUCOSE, CAPILLARY: Glucose-Capillary: 306 mg/dL — ABNORMAL HIGH (ref 70–99)

## 2012-03-13 HISTORY — PX: COLONOSCOPY: SHX174

## 2012-03-13 HISTORY — PX: ESOPHAGOGASTRODUODENOSCOPY: SHX1529

## 2012-04-16 ENCOUNTER — Other Ambulatory Visit (HOSPITAL_COMMUNITY): Payer: Self-pay | Admitting: Internal Medicine

## 2012-04-16 DIAGNOSIS — R110 Nausea: Secondary | ICD-10-CM

## 2012-04-19 ENCOUNTER — Ambulatory Visit (HOSPITAL_COMMUNITY)
Admission: RE | Admit: 2012-04-19 | Discharge: 2012-04-19 | Disposition: A | Discharge status: Home / Self Care | Payer: 59 | Source: Ambulatory Visit | Attending: Internal Medicine | Admitting: Internal Medicine

## 2012-04-19 DIAGNOSIS — R197 Diarrhea, unspecified: Secondary | ICD-10-CM | POA: Insufficient documentation

## 2012-04-19 DIAGNOSIS — K3189 Other diseases of stomach and duodenum: Secondary | ICD-10-CM | POA: Insufficient documentation

## 2012-04-19 DIAGNOSIS — Z9089 Acquired absence of other organs: Secondary | ICD-10-CM | POA: Insufficient documentation

## 2012-04-19 DIAGNOSIS — R112 Nausea with vomiting, unspecified: Secondary | ICD-10-CM | POA: Insufficient documentation

## 2012-04-19 DIAGNOSIS — R110 Nausea: Secondary | ICD-10-CM

## 2012-04-19 MED ORDER — TECHNETIUM TC 99M SULFUR COLLOID
2.2000 | Freq: Once | INTRAVENOUS | Status: AC | PRN
  Administered 2012-04-19: 2.2 via INTRAVENOUS

## 2012-04-24 IMAGING — NM NM HEPATO W/GB/PHARM/[PERSON_NAME]
3 series · 18 of 18 positions shown · non-contrast
Comparison: none

CLINICAL DATA: Right upper abdominal pain

[he hepatobiliary · 3.43mm/px · 6 of 58 frames shown (1 of 3)]
[frame 5/58]
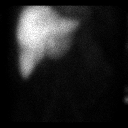
[frame 15/58]
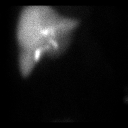
[frame 25/58]
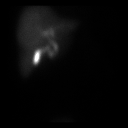
[frame 34/58]
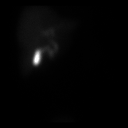
[frame 44/58]
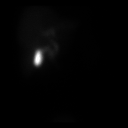
[frame 54/58]
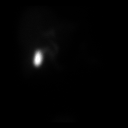

[he hepatobiliary · 3.43mm/px · 6 of 30 frames shown (2 of 3)]
[frame 3/30]
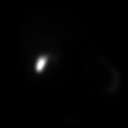
[frame 8/30]
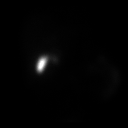
[frame 13/30]
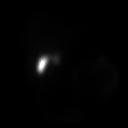
[frame 18/30]
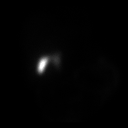
[frame 23/30]
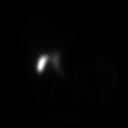
[frame 28/30]
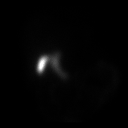

[he hepatobiliary · 3.43mm/px · 6 of 6 frames shown (3 of 3)]
[frame 1/6]
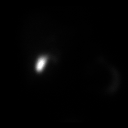
[frame 2/6]
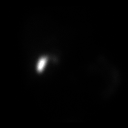
[frame 3/6]
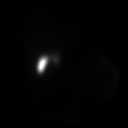
[frame 4/6]
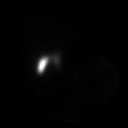
[frame 5/6]
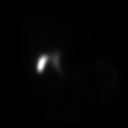
[frame 6/6]
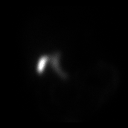

[18 of 18 positions shown; findings below may reference images not displayed]

HEPATOBILIARY SCINTIGRAPHY WITH EJECTION FRACTION

Anterior imaging afterS.DmQi LcIII Choletec IV. There is prompt
clearance of the radiopharmaceutical from the blood pool. Timely
visualization of activity in central bile ducts, small bowel, and
gallbladder.
After 1 hour 6.22mcg CCK was infused intravenously and imaging
continued. The patient reported pain with infusion.  The calculated
>30% at  thirty minutes (Ziessman et al., 6224 J. Nuclear Med).

IMPRESSION
1. Patency of cystic and common bile ducts.
2. Low gallbladder ejection fraction.

## 2012-05-14 ENCOUNTER — Other Ambulatory Visit: Payer: Self-pay | Admitting: Gastroenterology

## 2012-05-26 ENCOUNTER — Encounter (HOSPITAL_COMMUNITY): Payer: Self-pay | Admitting: Emergency Medicine

## 2012-05-26 ENCOUNTER — Emergency Department (HOSPITAL_COMMUNITY)
Admission: EM | Admit: 2012-05-26 | Discharge: 2012-05-27 | Disposition: A | Discharge status: Home / Self Care | Payer: 59 | Attending: Emergency Medicine | Admitting: Emergency Medicine

## 2012-05-26 ENCOUNTER — Emergency Department (HOSPITAL_COMMUNITY): Payer: 59

## 2012-05-26 DIAGNOSIS — F17200 Nicotine dependence, unspecified, uncomplicated: Secondary | ICD-10-CM | POA: Insufficient documentation

## 2012-05-26 DIAGNOSIS — IMO0002 Reserved for concepts with insufficient information to code with codable children: Secondary | ICD-10-CM | POA: Insufficient documentation

## 2012-05-26 DIAGNOSIS — L0390 Cellulitis, unspecified: Secondary | ICD-10-CM

## 2012-05-26 DIAGNOSIS — R739 Hyperglycemia, unspecified: Secondary | ICD-10-CM

## 2012-05-26 DIAGNOSIS — Z8669 Personal history of other diseases of the nervous system and sense organs: Secondary | ICD-10-CM | POA: Insufficient documentation

## 2012-05-26 DIAGNOSIS — E7800 Pure hypercholesterolemia, unspecified: Secondary | ICD-10-CM | POA: Insufficient documentation

## 2012-05-26 DIAGNOSIS — J449 Chronic obstructive pulmonary disease, unspecified: Secondary | ICD-10-CM | POA: Insufficient documentation

## 2012-05-26 DIAGNOSIS — R202 Paresthesia of skin: Secondary | ICD-10-CM

## 2012-05-26 DIAGNOSIS — Z79899 Other long term (current) drug therapy: Secondary | ICD-10-CM | POA: Insufficient documentation

## 2012-05-26 DIAGNOSIS — R7309 Other abnormal glucose: Secondary | ICD-10-CM | POA: Insufficient documentation

## 2012-05-26 DIAGNOSIS — Z794 Long term (current) use of insulin: Secondary | ICD-10-CM | POA: Insufficient documentation

## 2012-05-26 DIAGNOSIS — J4489 Other specified chronic obstructive pulmonary disease: Secondary | ICD-10-CM | POA: Insufficient documentation

## 2012-05-26 DIAGNOSIS — K219 Gastro-esophageal reflux disease without esophagitis: Secondary | ICD-10-CM | POA: Insufficient documentation

## 2012-05-26 DIAGNOSIS — Z87442 Personal history of urinary calculi: Secondary | ICD-10-CM | POA: Insufficient documentation

## 2012-05-26 DIAGNOSIS — E119 Type 2 diabetes mellitus without complications: Secondary | ICD-10-CM | POA: Insufficient documentation

## 2012-05-26 LAB — CBC WITH DIFFERENTIAL/PLATELET
Basophils Absolute: 0.1 10*3/uL (ref 0.0–0.1)
Basophils Relative: 1 % (ref 0–1)
Eosinophils Absolute: 0.1 10*3/uL (ref 0.0–0.7)
Eosinophils Relative: 1 % (ref 0–5)
HCT: 41.3 % (ref 39.0–52.0)
Hemoglobin: 15.2 g/dL (ref 13.0–17.0)
Lymphocytes Relative: 35 % (ref 12–46)
Lymphs Abs: 3.2 10*3/uL (ref 0.7–4.0)
MCH: 30 pg (ref 26.0–34.0)
MCHC: 36.8 g/dL — ABNORMAL HIGH (ref 30.0–36.0)
MCV: 81.6 fL (ref 78.0–100.0)
Monocytes Absolute: 0.7 10*3/uL (ref 0.1–1.0)
Monocytes Relative: 8 % (ref 3–12)
Neutro Abs: 5.3 10*3/uL (ref 1.7–7.7)
Neutrophils Relative %: 56 % (ref 43–77)
Platelets: 273 10*3/uL (ref 150–400)
RBC: 5.06 MIL/uL (ref 4.22–5.81)
RDW: 12 % (ref 11.5–15.5)
WBC: 9.4 10*3/uL (ref 4.0–10.5)

## 2012-05-26 LAB — COMPREHENSIVE METABOLIC PANEL WITH GFR
ALT: 17 U/L (ref 0–53)
AST: 14 U/L (ref 0–37)
Albumin: 3.9 g/dL (ref 3.5–5.2)
Alkaline Phosphatase: 79 U/L (ref 39–117)
BUN: 17 mg/dL (ref 6–23)
CO2: 25 meq/L (ref 19–32)
Calcium: 9.5 mg/dL (ref 8.4–10.5)
Chloride: 95 meq/L — ABNORMAL LOW (ref 96–112)
Creatinine, Ser: 0.68 mg/dL (ref 0.50–1.35)
GFR calc Af Amer: >90 mL/min
GFR calc non Af Amer: >90 mL/min
Glucose, Bld: 461 mg/dL — ABNORMAL HIGH (ref 70–99)
Potassium: 4.3 meq/L (ref 3.5–5.1)
Sodium: 133 meq/L — ABNORMAL LOW (ref 135–145)
Total Bilirubin: 0.6 mg/dL (ref 0.3–1.2)
Total Protein: 6.7 g/dL (ref 6.0–8.3)

## 2012-05-26 LAB — GLUCOSE, CAPILLARY
Glucose-Capillary: 378 mg/dL — ABNORMAL HIGH (ref 70–99)
Glucose-Capillary: 287 mg/dL — ABNORMAL HIGH (ref 70–99)

## 2012-05-26 MED ORDER — VANCOMYCIN HCL IN DEXTROSE 1-5 GM/200ML-% IV SOLN
1000.0000 mg | Freq: Once | INTRAVENOUS | Status: AC
  Administered 2012-05-26: 1000 mg via INTRAVENOUS
  Filled 2012-05-26: qty 200

## 2012-05-26 MED ORDER — INSULIN REGULAR HUMAN 100 UNIT/ML IJ SOLN: 12.0000 [IU] | Freq: Once | INTRAMUSCULAR | Status: DC

## 2012-05-26 MED ORDER — INSULIN ASPART 100 UNIT/ML ~~LOC~~ SOLN
12.0000 [IU] | Freq: Once | SUBCUTANEOUS | Status: AC
  Administered 2012-05-26: 12 [IU] via SUBCUTANEOUS
  Filled 2012-05-26: qty 1

## 2012-05-26 MED ORDER — SODIUM CHLORIDE 0.9 % IV BOLUS (SEPSIS)
1000.0000 mL | Freq: Once | INTRAVENOUS | Status: AC
  Administered 2012-05-26: 1000 mL via INTRAVENOUS

## 2012-05-26 MED ORDER — MUPIROCIN 2 % EX OINT: TOPICAL_OINTMENT | Freq: Three times a day (TID) | CUTANEOUS | Status: DC

## 2012-05-26 MED ORDER — DOXYCYCLINE HYCLATE 100 MG PO CAPS: 100.0000 mg | ORAL_CAPSULE | Freq: Two times a day (BID) | ORAL | Status: DC

## 2012-05-26 NOTE — ED Provider Notes (Signed)
History     CSN: [id number]  Arrival date & time 05/26/12  1932   First MD Initiated Contact with Patient 05/26/12 2011      Chief Complaint  Patient presents with  . Numbness   HPI  History provided by the patient. Patient is a 47 year old male with history of diabetes, hyponatremia, COPD, neuropathy of the lower legs who presents with complaints of numbness and electric pains to his right hand and forearm area. Patient reports waking up around 3 AM with symptoms that he initially attributed to sleeping on his arm. Symptoms however did not improve through the morning and all day. They have been the same is worse with movements. He denies any pains in the upper arm or elbow. No neck pain. Patient does also report lesions to bilateral hands are caused from allergic reaction and scratching. These didn't become infected with MRSA for which he was treated with doxycycline for 10 days a week and a half ago. He denies having any increased redness, swelling, bleeding or drainage from these areas. He is not been using any treatments for his wounds at this time. Denies any fevers, chills or sweats.    Past Medical History  Diagnosis Date  . Diabetes mellitus   . COPD (chronic obstructive pulmonary disease)   . GERD (gastroesophageal reflux disease)   . Hypercholesteremia   . Neuropathy     legs/ feet  . Biliary dyskinesia 11/02/2010  . Renal stones     Past Surgical History  Procedure Laterality Date  . Rotator cuff repair  12/2004    right  . Carpal tunnel release  2003    right  . Laparoscopic cholecystectomy w/ cholangiography  11/17/2010    Dr Jamey Ripa  . Cholecystectomy      Family History  Problem Relation Age of Onset  . Diabetes Mother   . Diabetes Father   . Hypertension Father   . Kidney disease Father     History  Substance Use Topics  . Smoking status: Current Every Day Smoker -- 2.00 packs/day    Types: Cigarettes  . Smokeless tobacco: Not on file  . Alcohol  Use: Yes     Comment: socially      Review of Systems  Constitutional: Negative for fever, chills and diaphoresis.  HENT: Negative for neck pain.   Neurological: Positive for numbness. Negative for headaches.  All other systems reviewed and are negative.    Allergies  Celebrex; Hydrocodone-acetaminophen; and Sulfa antibiotics  Home Medications   Current Outpatient Rx  Name  Route  Sig  Dispense  Refill  . atorvastatin (LIPITOR) 40 MG tablet   Oral   Take 40 mg by mouth daily.         . cholecalciferol (VITAMIN D) 1000 UNITS tablet   Oral   Take 1,000 Units by mouth daily.         . Coenzyme Q10 (CO Q 10) 10 MG CAPS   Oral   Take 1 tablet by mouth daily.          . fentaNYL (DURAGESIC - DOSED MCG/HR) 25 MCG/HR   Transdermal   Place onto the skin every other day. Change every 48hrs         . fish oil-omega-3 fatty acids 1000 MG capsule   Oral   Take 1,000-2,000 mg by mouth 2 (two) times daily. Take 1 capsule in the morning and 2 capsules in the evening         . FLUoxetine (  PROZAC) 40 MG capsule   Oral   Take 40 mg by mouth daily.         Marland Kitchen gabapentin (NEURONTIN) 300 MG capsule   Oral   Take 600 mg by mouth 4 (four) times daily.          Marland Kitchen HYDROmorphone (DILAUDID) 2 MG tablet   Oral   Take 2 mg by mouth every 4 (four) hours as needed for pain.         Marland Kitchen insulin lispro protamine-insulin lispro (HUMALOG 75/25) (75-25) 100 UNIT/ML SUSP   Subcutaneous   Inject 20-60 Units into the skin 3 (three) times daily before meals. Take 60 units in the morning and with dinner and 20 units at lunch         . levothyroxine (SYNTHROID, LEVOTHROID) 25 MCG tablet   Oral   Take 25 mcg by mouth daily.         . Liraglutide (VICTOZA) 18 MG/3ML SOLN injection   Subcutaneous   Inject 1.2 mg into the skin daily.         . metFORMIN (GLUCOPHAGE) 1000 MG tablet   Oral   Take 1,000 mg by mouth 2 (two) times daily with a meal.         . metoCLOPramide  (REGLAN) 5 MG tablet   Oral   Take 5 mg by mouth 4 (four) times daily.         Marland Kitchen omeprazole (PRILOSEC) 20 MG capsule   Oral   Take 20 mg by mouth daily.           . pregabalin (LYRICA) 150 MG capsule   Oral   Take 150 mg by mouth 3 (three) times daily.         . vitamin B-12 (CYANOCOBALAMIN) 1000 MCG tablet   Oral   Take 1,000 mcg by mouth daily.           BP 143/88  Pulse 66  Temp(Src) 97.9 F (36.6 C) (Oral)  Resp 18  Ht 6' (1.829 m)  Wt 206 lb (93.441 kg)  BMI 27.93 kg/m2  SpO2 100%  Physical Exam  Nursing note and vitals reviewed. Constitutional: He is oriented to person, place, and time. He appears well-developed and well-nourished. No distress.  HENT:  Head: Normocephalic.  Eyes: Conjunctivae and EOM are normal. Pupils are equal, round, and reactive to light.  No nystagmus  Neck: Normal range of motion. Neck supple.  No midline cervical tenderness  Cardiovascular: Normal rate and regular rhythm.   Pulmonary/Chest: Effort normal and breath sounds normal. No respiratory distress. He has no wheezes.  Abdominal: Soft.  Musculoskeletal:  Multiple healing ulcers to bilateral hands and forearms. There are some healed scars present as well. No significant surrounding erythema or induration of the skin. No erythematous streaks up the arm. There does appear to be mild swelling in the right hand and fingers. Patient has normal cap refill less than 2 seconds. Sensations are decreased slightly diffusely throughout the hand.  Normal right elbow, shoulder and cervical neck exam  Neurological: He is alert and oriented to person, place, and time. He has normal strength. No cranial nerve deficit or sensory deficit. Coordination normal.  Skin: Skin is warm.  Psychiatric: He has a normal mood and affect.    ED Course  Procedures   Results for orders placed during the hospital encounter of 05/26/12  CBC WITH DIFFERENTIAL      Result Value Range   WBC 9.4  4.0 -  10.5  K/uL   RBC 5.06  4.22 - 5.81 MIL/uL   Hemoglobin 15.2  13.0 - 17.0 g/dL   HCT 16.1  09.6 - 04.5 %   MCV 81.6  78.0 - 100.0 fL   MCH 30.0  26.0 - 34.0 pg   MCHC 36.8 (*) 30.0 - 36.0 g/dL   RDW 40.9  81.1 - 91.4 %   Platelets 273  150 - 400 K/uL   Neutrophils Relative 56  43 - 77 %   Neutro Abs 5.3  1.7 - 7.7 K/uL   Lymphocytes Relative 35  12 - 46 %   Lymphs Abs 3.2  0.7 - 4.0 K/uL   Monocytes Relative 8  3 - 12 %   Monocytes Absolute 0.7  0.1 - 1.0 K/uL   Eosinophils Relative 1  0 - 5 %   Eosinophils Absolute 0.1  0.0 - 0.7 K/uL   Basophils Relative 1  0 - 1 %   Basophils Absolute 0.1  0.0 - 0.1 K/uL  COMPREHENSIVE METABOLIC PANEL      Result Value Range   Sodium 133 (*) 135 - 145 mEq/L   Potassium 4.3  3.5 - 5.1 mEq/L   Chloride 95 (*) 96 - 112 mEq/L   CO2 25  19 - 32 mEq/L   Glucose, Bld 461 (*) 70 - 99 mg/dL   BUN 17  6 - 23 mg/dL   Creatinine, Ser 7.82  0.50 - 1.35 mg/dL   Calcium 9.5  8.4 - 95.6 mg/dL   Total Protein 6.7  6.0 - 8.3 g/dL   Albumin 3.9  3.5 - 5.2 g/dL   AST 14  0 - 37 U/L   ALT 17  0 - 53 U/L   Alkaline Phosphatase 79  39 - 117 U/L   Total Bilirubin 0.6  0.3 - 1.2 mg/dL   GFR calc non Af Amer >90  >90 mL/min   GFR calc Af Amer >90  >90 mL/min  GLUCOSE, CAPILLARY      Result Value Range   Glucose-Capillary 378 (*) 70 - 99 mg/dL   Comment 1 Notify RN    GLUCOSE, CAPILLARY      Result Value Range   Glucose-Capillary 287 (*) 70 - 99 mg/dL   Comment 1 Notify RN    GLUCOSE, CAPILLARY      Result Value Range   Glucose-Capillary 226 (*) 70 - 99 mg/dL      Dg Hand Complete Right  05/26/2012  *RADIOLOGY REPORT*  Clinical Data: Pain and sores on the right hand.  RIGHT HAND - COMPLETE 3+ VIEW  Comparison: 05/10/2010  Findings: The bones are normal.  No radiodense foreign bodies.  No arthritis.  IMPRESSION: Normal exam.   Original Report Authenticated By: Francene Boyers, M.D.      1. Cellulitis   2. Paresthesias in right hand   3. Hyperglycemia        MDM  8:30 PM patient seen and evaluated. Patient appears well in no acute distress. He deos not appear severely ill or toxic  Blood sugar elevated. Will give fluids and dose of insulin.  Blood sugar improved. Labs concerning otherwise. Patient instructed to keep close monitor of his blood sugars. No concerning findings for underlying osteomyelitis on x-ray. Will give treatment for possible cellulitis infection although exam not overly concerning for severe infection at this time.     Angus Seller, PA-C 05/27/12 [phone]

## 2012-05-26 NOTE — ED Notes (Signed)
Pt c/o R hand numbness, onset 0300. Pt describes as tingling. Pt does have multiple wounds to same hand.

## 2012-05-27 LAB — GLUCOSE, CAPILLARY: Glucose-Capillary: 226 mg/dL — ABNORMAL HIGH (ref 70–99)

## 2012-05-27 NOTE — ED Provider Notes (Signed)
Medical screening examination/treatment/procedure(s) were performed by non-physician practitioner and as supervising physician I was immediately available for consultation/collaboration.   Phyllip Claw, MD 05/27/12 1529 

## 2012-07-17 ENCOUNTER — Encounter (HOSPITAL_COMMUNITY): Payer: Self-pay | Admitting: *Deleted

## 2012-07-17 ENCOUNTER — Emergency Department (HOSPITAL_COMMUNITY)
Admission: EM | Admit: 2012-07-17 | Discharge: 2012-07-17 | Disposition: A | Discharge status: Home / Self Care | Payer: 59 | Attending: Emergency Medicine | Admitting: Emergency Medicine

## 2012-07-17 DIAGNOSIS — Z87442 Personal history of urinary calculi: Secondary | ICD-10-CM | POA: Insufficient documentation

## 2012-07-17 DIAGNOSIS — Z8719 Personal history of other diseases of the digestive system: Secondary | ICD-10-CM | POA: Insufficient documentation

## 2012-07-17 DIAGNOSIS — J4489 Other specified chronic obstructive pulmonary disease: Secondary | ICD-10-CM | POA: Insufficient documentation

## 2012-07-17 DIAGNOSIS — E7800 Pure hypercholesterolemia, unspecified: Secondary | ICD-10-CM | POA: Insufficient documentation

## 2012-07-17 DIAGNOSIS — Z79899 Other long term (current) drug therapy: Secondary | ICD-10-CM | POA: Insufficient documentation

## 2012-07-17 DIAGNOSIS — R109 Unspecified abdominal pain: Secondary | ICD-10-CM | POA: Insufficient documentation

## 2012-07-17 DIAGNOSIS — R6883 Chills (without fever): Secondary | ICD-10-CM | POA: Insufficient documentation

## 2012-07-17 DIAGNOSIS — IMO0002 Reserved for concepts with insufficient information to code with codable children: Secondary | ICD-10-CM | POA: Insufficient documentation

## 2012-07-17 DIAGNOSIS — J449 Chronic obstructive pulmonary disease, unspecified: Secondary | ICD-10-CM | POA: Insufficient documentation

## 2012-07-17 DIAGNOSIS — Z9089 Acquired absence of other organs: Secondary | ICD-10-CM | POA: Insufficient documentation

## 2012-07-17 DIAGNOSIS — R112 Nausea with vomiting, unspecified: Secondary | ICD-10-CM | POA: Insufficient documentation

## 2012-07-17 DIAGNOSIS — F17200 Nicotine dependence, unspecified, uncomplicated: Secondary | ICD-10-CM | POA: Insufficient documentation

## 2012-07-17 DIAGNOSIS — G589 Mononeuropathy, unspecified: Secondary | ICD-10-CM | POA: Insufficient documentation

## 2012-07-17 DIAGNOSIS — E1149 Type 2 diabetes mellitus with other diabetic neurological complication: Secondary | ICD-10-CM | POA: Insufficient documentation

## 2012-07-17 DIAGNOSIS — K219 Gastro-esophageal reflux disease without esophagitis: Secondary | ICD-10-CM | POA: Insufficient documentation

## 2012-07-17 LAB — COMPREHENSIVE METABOLIC PANEL
ALT: 19 U/L (ref 0–53)
AST: 16 U/L (ref 0–37)
Albumin: 4.1 g/dL (ref 3.5–5.2)
Alkaline Phosphatase: 77 U/L (ref 39–117)
BUN: 18 mg/dL (ref 6–23)
CO2: 27 mEq/L (ref 19–32)
Calcium: 9.9 mg/dL (ref 8.4–10.5)
Chloride: 99 mEq/L (ref 96–112)
Creatinine, Ser: 0.7 mg/dL (ref 0.50–1.35)
GFR calc Af Amer: >90 mL/min (ref >90)
GFR calc non Af Amer: >90 mL/min (ref >90)
Glucose, Bld: 201 mg/dL — ABNORMAL HIGH (ref 70–99)
Potassium: 4 mEq/L (ref 3.5–5.1)
Sodium: 138 mEq/L (ref 135–145)
Total Bilirubin: 1.2 mg/dL (ref 0.3–1.2)
Total Protein: 7.3 g/dL (ref 6.0–8.3)

## 2012-07-17 LAB — URINE MICROSCOPIC-ADD ON

## 2012-07-17 LAB — CBC WITH DIFFERENTIAL/PLATELET
Basophils Absolute: 0.1 10*3/uL (ref 0.0–0.1)
Basophils Relative: 0 % (ref 0–1)
Eosinophils Absolute: 0.1 10*3/uL (ref 0.0–0.7)
Eosinophils Relative: 0 % (ref 0–5)
HCT: 50.5 % (ref 39.0–52.0)
Hemoglobin: 18.1 g/dL — ABNORMAL HIGH (ref 13.0–17.0)
Lymphocytes Relative: 21 % (ref 12–46)
Lymphs Abs: 3.1 10*3/uL (ref 0.7–4.0)
MCH: 29.8 pg (ref 26.0–34.0)
MCHC: 35.8 g/dL (ref 30.0–36.0)
MCV: 83.2 fL (ref 78.0–100.0)
Monocytes Absolute: 1.1 10*3/uL — ABNORMAL HIGH (ref 0.1–1.0)
Monocytes Relative: 7 % (ref 3–12)
Neutro Abs: 10.7 10*3/uL — ABNORMAL HIGH (ref 1.7–7.7)
Neutrophils Relative %: 71 % (ref 43–77)
Platelets: 332 10*3/uL (ref 150–400)
RBC: 6.07 MIL/uL — ABNORMAL HIGH (ref 4.22–5.81)
RDW: 12.9 % (ref 11.5–15.5)
WBC: 15.1 10*3/uL — ABNORMAL HIGH (ref 4.0–10.5)

## 2012-07-17 LAB — URINALYSIS, ROUTINE W REFLEX MICROSCOPIC
Glucose, UA: 100 mg/dL — AB
Hgb urine dipstick: NEGATIVE
Ketones, ur: 15 mg/dL — AB
Leukocytes, UA: NEGATIVE
Nitrite: NEGATIVE
Protein, ur: 30 mg/dL — AB
Specific Gravity, Urine: 1.029 (ref 1.005–1.030)
Urobilinogen, UA: 1 mg/dL (ref 0.0–1.0)
pH: 8 (ref 5.0–8.0)

## 2012-07-17 LAB — LIPASE, BLOOD: Lipase: 20 U/L (ref 11–59)

## 2012-07-17 MED ORDER — ONDANSETRON HCL 4 MG/2ML IJ SOLN
4.0000 mg | Freq: Once | INTRAMUSCULAR | Status: AC
  Administered 2012-07-17: 4 mg via INTRAVENOUS

## 2012-07-17 MED ORDER — SODIUM CHLORIDE 0.9 % IV SOLN: Freq: Once | INTRAVENOUS | Status: DC

## 2012-07-17 MED ORDER — ONDANSETRON HCL 4 MG/2ML IJ SOLN
INTRAMUSCULAR | Status: AC
  Filled 2012-07-17: qty 2

## 2012-07-17 MED ORDER — ONDANSETRON 8 MG PO TBDP
8.0000 mg | ORAL_TABLET | Freq: Once | ORAL | Status: AC
  Administered 2012-07-17: 8 mg via ORAL
  Filled 2012-07-17: qty 1

## 2012-07-17 MED ORDER — ONDANSETRON HCL 4 MG PO TABS: 4.0000 mg | ORAL_TABLET | Freq: Four times a day (QID) | ORAL | Status: DC

## 2012-07-17 MED ORDER — SODIUM CHLORIDE 0.9 % IV BOLUS (SEPSIS)
500.0000 mL | Freq: Once | INTRAVENOUS | Status: AC
  Administered 2012-07-17: 500 mL via INTRAVENOUS

## 2012-07-17 NOTE — ED Provider Notes (Signed)
History     CSN: [id number]  Arrival date & time 07/17/12  1501   First MD Initiated Contact with Patient 07/17/12 1645      Chief Complaint  Patient presents with  . Emesis  . Abdominal Pain    (Consider location/radiation/quality/duration/timing/severity/associated sxs/prior treatment) HPI  Patient is a 47 year old male past medical history significant for DM, GERD, gastroparesis presenting to the emergency department for 5-6 episodes of nonbloody nonbilious emesis that began at 8 AM this morning when patient woke up. He is having some mild associated epigastric discomfort with each episode of emesis that resolves within a few minutes of vomiting. Has had some associated chills but denies any fever. Has chronic suprapubic pain for 2-3 months that he is currently being evaluated for by his PCP, including a recent CT scan that he does not know result of yet. He denies any changes in his suprapubic pain. Denies any sick contacts or questionable food sources. Denies diarrhea, constipation, headache, chest pain, shortness of breath.   Past Medical History  Diagnosis Date  . Diabetes mellitus   . COPD (chronic obstructive pulmonary disease)   . GERD (gastroesophageal reflux disease)   . Hypercholesteremia   . Neuropathy     legs/ feet  . Biliary dyskinesia 11/02/2010  . Renal stones     Past Surgical History  Procedure Laterality Date  . Rotator cuff repair  12/2004    right  . Carpal tunnel release  2003    right  . Laparoscopic cholecystectomy w/ cholangiography  11/17/2010    Dr Jamey Ripa  . Cholecystectomy      Family History  Problem Relation Age of Onset  . Diabetes Mother   . Diabetes Father   . Hypertension Father   . Kidney disease Father     History  Substance Use Topics  . Smoking status: Current Every Day Smoker -- 2.00 packs/day    Types: Cigarettes  . Smokeless tobacco: Not on file  . Alcohol Use: Yes     Comment: socially      Review of Systems    Constitutional: Positive for chills. Negative for fever.  HENT: Negative.  Negative for neck pain and neck stiffness.   Eyes: Negative for visual disturbance.  Respiratory: Negative for shortness of breath.   Cardiovascular: Negative for chest pain.  Gastrointestinal: Positive for abdominal pain.  Genitourinary: Negative for dysuria.  Musculoskeletal: Negative for back pain.  Neurological: Negative for headaches.    Allergies  Celebrex; Hydrocodone-acetaminophen; and Sulfa antibiotics  Home Medications   Current Outpatient Rx  Name  Route  Sig  Dispense  Refill  . atorvastatin (LIPITOR) 40 MG tablet   Oral   Take 40 mg by mouth every morning.          . cholecalciferol (VITAMIN D) 1000 UNITS tablet   Oral   Take 1,000 Units by mouth every morning.          . Coenzyme Q10 (CO Q 10) 10 MG CAPS   Oral   Take 1 tablet by mouth daily.          . fentaNYL (DURAGESIC - DOSED MCG/HR) 50 MCG/HR   Transdermal   Place 1 patch onto the skin every other day.         . fish oil-omega-3 fatty acids 1000 MG capsule   Oral   Take 1,000-2,000 mg by mouth 2 (two) times daily. Take 1 capsule in the morning and 2 capsules in the evening         .  FLUoxetine (PROZAC) 40 MG capsule   Oral   Take 40 mg by mouth daily.         Marland Kitchen gabapentin (NEURONTIN) 300 MG capsule   Oral   Take 600 mg by mouth 4 (four) times daily.          Marland Kitchen HYDROmorphone (DILAUDID) 2 MG tablet   Oral   Take 2 mg by mouth every 4 (four) hours as needed for pain.         Marland Kitchen insulin lispro protamine-insulin lispro (HUMALOG 75/25) (75-25) 100 UNIT/ML SUSP   Subcutaneous   Inject 20-60 Units into the skin 3 (three) times daily before meals. Take 60 units in the morning and with dinner and 20 units at lunch         . levothyroxine (SYNTHROID, LEVOTHROID) 25 MCG tablet   Oral   Take 25 mcg by mouth daily.         . Liraglutide (VICTOZA) 18 MG/3ML SOLN injection   Subcutaneous   Inject 1.2 mg into  the skin daily.         . metFORMIN (GLUCOPHAGE) 1000 MG tablet   Oral   Take 1,000 mg by mouth 2 (two) times daily with a meal.         . metoCLOPramide (REGLAN) 5 MG tablet   Oral   Take 5 mg by mouth 4 (four) times daily.         . mupirocin ointment (BACTROBAN) 2 %   Topical   Apply topically 3 (three) times daily.   22 g   0   . omeprazole (PRILOSEC) 20 MG capsule   Oral   Take 20 mg by mouth daily.           . pregabalin (LYRICA) 150 MG capsule   Oral   Take 150 mg by mouth 3 (three) times daily.         . vitamin B-12 (CYANOCOBALAMIN) 1000 MCG tablet   Oral   Take 1,000 mcg by mouth every morning.            BP 135/87  Pulse 94  Temp(Src) 98.1 F (36.7 C) (Oral)  Resp 18  SpO2 94%  Physical Exam  Constitutional: He is oriented to person, place, and time. He appears well-developed and well-nourished. No distress.  HENT:  Head: Normocephalic and atraumatic.  Mouth/Throat: Uvula is midline. Mucous membranes are dry. No edematous. No oropharyngeal exudate, posterior oropharyngeal edema, posterior oropharyngeal erythema or tonsillar abscesses.  Eyes: EOM are normal. Pupils are equal, round, and reactive to light.  Neck: Neck supple.  Cardiovascular: Normal rate, regular rhythm and normal heart sounds.   Pulmonary/Chest: Effort normal and breath sounds normal. No respiratory distress. He has no wheezes.  Abdominal: Soft. Bowel sounds are normal. There is tenderness. There is no rigidity, no guarding and no CVA tenderness.    Tender suprapubic pain w/o change in pain from baseline.   Neurological: He is alert and oriented to person, place, and time.  Skin: Skin is warm and dry. He is not diaphoretic.  Psychiatric: He has a normal mood and affect.    ED Course  Procedures (including critical care time)  Medications  0.9 %  sodium chloride infusion ( Intravenous Rate/Dose Change 07/17/12 1750)  ondansetron (ZOFRAN-ODT) disintegrating tablet 8 mg (8  mg Oral Given 07/17/12 1607)  sodium chloride 0.9 % bolus 500 mL (0 mLs Intravenous Stopped 07/17/12 2147)  ondansetron (ZOFRAN) injection 4 mg (4 mg Intravenous Given 07/17/12  1900)   Patient's symptoms resolving w/ medication. Abdomen remains non-surgical on re-examination.   Labs Reviewed  CBC WITH DIFFERENTIAL - Abnormal; Notable for the following:    WBC 15.1 (*)    RBC 6.07 (*)    Hemoglobin 18.1 (*)    Neutro Abs 10.7 (*)    Monocytes Absolute 1.1 (*)    All other components within normal limits  COMPREHENSIVE METABOLIC PANEL - Abnormal; Notable for the following:    Glucose, Bld 201 (*)    All other components within normal limits  URINALYSIS, ROUTINE W REFLEX MICROSCOPIC - Abnormal; Notable for the following:    Color, Urine AMBER (*)    Glucose, UA 100 (*)    Bilirubin Urine SMALL (*)    Ketones, ur 15 (*)    Protein, ur 30 (*)    All other components within normal limits  URINE MICROSCOPIC-ADD ON - Abnormal; Notable for the following:    Bacteria, UA FEW (*)    All other components within normal limits  LIPASE, BLOOD   No results found.   1. Nausea & vomiting       MDM  Patient is nontoxic, nonseptic appearing, in no apparent distress.  Patient's pain and other symptoms adequately managed in emergency department.  Fluid bolus given.  Labs and vitals reviewed.  Patient does not meet the SIRS or Sepsis criteria.  On repeat exam patient does not have a surgical abdomen and there are nor peritoneal signs.  No indication of appendicitis, bowel obstruction, bowel perforation, cholecystitis, diverticulitis.  Patient discharged home with symptomatic treatment and given strict instructions for follow-up with their primary care physician.  I have also discussed reasons to return immediately to the ER.  Patient expresses understanding and agrees with plan.          Jeannetta Ellis, PA-C 07/18/12 [phone]

## 2012-07-17 NOTE — ED Notes (Signed)
Pt reports he has been vomiting non-stop since waking up this am. Reports has had abd pain x's months. Denies diarrhea

## 2012-07-18 NOTE — ED Provider Notes (Signed)
Medical screening examination/treatment/procedure(s) were conducted as a shared visit with non-physician practitioner(s) and myself.  I personally evaluated the patient during the encounter.  47 year old male with nausea and vomiting started this morning. Some mild epigastric discomfort.in his abdominal exam is benign for any. He reports feeling better with fluids and antiemetics. Workup pre-unremarkable. Hyperglycemic, but history of diabetes no acidosis. He does have a leukocytosis, but this is nonspecific. Very low suspicion for acute abdominal surgical process. Feel is safe for discharge at this time. Return precautions discussed. Outpatient followup otherwise.   Raeford Razor, MD 07/18/12 [phone]

## 2012-07-25 ENCOUNTER — Other Ambulatory Visit: Payer: Self-pay | Admitting: Urology

## 2012-08-01 ENCOUNTER — Encounter (HOSPITAL_BASED_OUTPATIENT_CLINIC_OR_DEPARTMENT_OTHER): Payer: Self-pay | Admitting: *Deleted

## 2012-08-02 ENCOUNTER — Encounter (HOSPITAL_BASED_OUTPATIENT_CLINIC_OR_DEPARTMENT_OTHER): Payer: Self-pay | Admitting: *Deleted

## 2012-08-02 NOTE — Progress Notes (Signed)
SPOKE W/ WIFE, KIMBERLY. NPO AFTER MN. ARRIVES AT 0815. NEEDS ISTAT AND EKG. WILL TAKE SYNTHROID, PRILOSEC, GABAPENTIN, AND LYRICA AM OF SURG W/ SIPS OF WATER.

## 2012-08-08 DIAGNOSIS — D494 Neoplasm of unspecified behavior of bladder: Secondary | ICD-10-CM | POA: Diagnosis present

## 2012-08-08 NOTE — H&P (Signed)
History of Present Illness                Phimosis: He developed phimosis secondary to diabetes in 10/03 and underwent a circumcision in 2/05.  Microscopic hematuria: He was found to have 7-10 rbc's/hpf. He has a 60-pack-year smoking history.   Prostatic asymmetry: He was found on DRE to have some right lobe enlargement relative to the left however there were no worrisome findings and his PSA in 4/14 was 0.47.  LUTS: He noted the acute onset of hesitancy and intermittency with some associated suprapubic tenderness and intermittent bilateral testicular pain without associated dysuria  Interval history: He actually passed a small blood clot this morning. He otherwise has no new urologic complaints.    Past Medical History Problems  1. History of  Anxiety (Symptom) 300.00 2. History of  Depression 311 3. History of  Diabetes Mellitus 250.00 4. History of  Esophageal Reflux 530.81 5. History of  Gastric Ulcer 531.90 6. History of  Hypercholesterolemia 272.0 7. History of  Hypertension 401.9 8. History of  Hypothyroidism 244.9 9. History of  Ischemic Stroke 434.90  Surgical History Problems  1. History of  Cholecystectomy 2. History of  Elective Circumcision V50.2 3. History of  Rotator Cuff Repair Right 4. History of  Wrist Arthroscopy With Release Of Transverse Carpal Ligament Right  Current Meds 1. Atorvastatin Calcium 10 MG Oral Tablet; Therapy: 04Sep2013 to 2. FentaNYL 25 MCG/HR Transdermal Patch 72 Hour; Therapy: (Recorded:28Apr2014) to 3. FLUoxetine HCl 40 MG Oral Capsule; Therapy: 27Mar2013 to 4. Gabapentin 600 MG Oral Tablet; Therapy: 31Dec2013 to 5. HumaLOG Mix 75/25 KwikPen (75-25) 100 UNIT/ML SUSP; Therapy: 05Sep2013 to 6. HYDROmorphone HCl 2 MG Oral Tablet; Therapy: 16Jan2014 to 7. Levothyroxine Sodium 25 MCG Oral Tablet; Therapy: 10Feb2014 to 8. Lyrica 150 MG Oral Capsule; Therapy: 13Mar2014 to 9. MetFORMIN HCl 1000 MG Oral Tablet; Therapy: 04Sep2013 to 10. NexIUM  20 MG Oral Capsule Delayed Release; Therapy: (Recorded:28Apr2014) to 11. Victoza 18 MG/3ML SOLN; Therapy: 15Jan2014 to  Allergies Medication  1. CeleBREX CAPS 2. Hydrocodone-Acetaminophen CAPS 3. Sulfa Drugs  Family History Problems  1. Paternal history of  Acute Myocardial Infarction V17.3 2. Family history of  Death In The Family Father 3. Family history of  Diabetes Mellitus V18.0 4. Family history of  Family Health Status Children ___ Living Daughters 3 5. Family history of  Family Health Status Of Mother - Alive 6. Paternal history of  Nephrolithiasis 7. Paternal history of  Renal Failure  Social History Problems  1. Alcohol Use occasional/rare 2. Caffeine Use 4-5 pepsi 3. Marital History - Currently Married 4. Tobacco Use 305.1 smokes 2 ppd for 30 years  Review of Systems Genitourinary, constitutional, skin, eye, otolaryngeal, hematologic/lymphatic, cardiovascular, pulmonary, endocrine, musculoskeletal, gastrointestinal, neurological and psychiatric system(s) were reviewed and pertinent findings if present are noted.  Genitourinary: feelings of urinary urgency, nocturia, difficulty starting the urinary stream, urinary stream starts and stops, incomplete emptying of bladder, erectile dysfunction, testicular pain and initiating urination requires straining.  Gastrointestinal: nausea and heartburn.  Constitutional: feeling tired (fatigue) and recent ~Ulb weight loss.  Integumentary: skin rash/lesion and pruritus.  Respiratory: shortness of breath.  Endocrine: polydipsia.  Musculoskeletal: back pain and joint pain.  Psychiatric: depression.    Vitals Vital Signs BMI Calculated: 27.16 BSA Calculated: 2.08 Height: 5 ft 11 in Weight: 194 lb  Blood Pressure: 123 / 81 Temperature: 97.4 F Heart Rate: 83   BMI Calculated: 26.74 BSA Calculated: 2.07 Height: 5 ft 11 in Weight:  191 lb  Blood Pressure: 121 / 76 Temperature: 97.6 F Heart Rate: 83  Results/Data  The  following images/tracing/specimen were independently visualized:  CT scan as below.  The following clinical lab reports were reviewed:  UA:.  The following radiology reports were reviewed: CT scan. Selected Results  PSA REFLEX TO FREE  Vincent [name]  SPECIMEN TYPE: BLOOD   Test Name Result Flag Reference  PSA 0.47 ng/mL  <=4.00  TEST METHODOLOGY: ECLIA PSA (ELECTROCHEMILUMINESCENCE IMMUNOASSAY)   BUN & CREATININE  Vincent [name]  SPECIMEN TYPE: BLOOD   Test Name Result Flag Reference  CREATININE 0.62 mg/dL  5.40-9.81  BUN 6 mg/dL  1-91  Est GFR, African American >89 mL/min    Est GFR, NonAfrican American >89 mL/min    THE ESTIMATED GFR IS A CALCULATION VALID FOR ADULTS (>=65 YEARS OLD) THAT USES THE CKD-EPI ALGORITHM TO ADJUST FOR AGE AND SEX. IT IS   NOT TO BE USED FOR CHILDREN, PREGNANT WOMEN, HOSPITALIZED PATIENTS,    PATIENTS ON DIALYSIS, OR WITH RAPIDLY CHANGING KIDNEY FUNCTION. ACCORDING TO THE NKDEP, EGFR >89 IS NORMAL, 60-89 SHOWS MILD IMPAIRMENT, 30-59 SHOWS MODERATE IMPAIRMENT, 15-29 SHOWS SEVERE IMPAIRMENT AND <15 IS ESRD.   Procedure  Procedure: Cystoscopy   Indication: Hematuria.  Informed Consent: Risks, benefits, and potential adverse events were discussed and informed consent was obtained from the patient.  Prep: The patient was prepped with betadine.  Anesthesia:. Local anesthesia was administered intraurethrally with 2% lidocaine jelly.  Procedure Note:  Urethral meatus:. No abnormalities.  Anterior urethra: No abnormalities.  Prostatic urethra: No abnormalities . The lateral prostatic lobes were enlarged.  Bladder: Visulization was clear. The ureteral orifices were in the normal anatomic position bilaterally and had clear efflux of urine. A papillary tumor was seen in the bladder. This tumor was located on the posterior aspect of the bladder. The patient tolerated the procedure well.  Complications: None.    Assessment Assessed  1. Microscopic Hematuria  599.72 2. 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  He was found to have what appears to be a superficial bladder tumor on the posterior wall of the bladder to the left of midline. I therefore have discussed the need for surgical resection of this tumor. This will allow for grading and staging. I went over the procedure with him in detail including its risks and complications, the alternatives, the anticipated probability of success as well as postoperative course. I have answered all of his questions and he has elected to proceed. We'll also treat him postoperatively with mitomycin-C.   Plan Papillary Transitional Cell Carcinoma Of The Bladder (188.9)     1. He will be scheduled for transurethral resection of his bladder tumor. 2. I anticipate instillation of mitomycin C. postoperatively.

## 2012-08-09 ENCOUNTER — Encounter (HOSPITAL_BASED_OUTPATIENT_CLINIC_OR_DEPARTMENT_OTHER): Payer: Self-pay | Admitting: Anesthesiology

## 2012-08-09 ENCOUNTER — Ambulatory Visit (HOSPITAL_BASED_OUTPATIENT_CLINIC_OR_DEPARTMENT_OTHER)
Admission: RE | Admit: 2012-08-09 | Discharge: 2012-08-09 | Disposition: A | Discharge status: Home / Self Care | Payer: 59 | Source: Ambulatory Visit | Attending: Urology | Admitting: Urology

## 2012-08-09 ENCOUNTER — Encounter (HOSPITAL_BASED_OUTPATIENT_CLINIC_OR_DEPARTMENT_OTHER)
Admission: RE | Disposition: A | Discharge status: Home / Self Care | Payer: Self-pay | Source: Ambulatory Visit | Attending: Urology

## 2012-08-09 ENCOUNTER — Ambulatory Visit (HOSPITAL_BASED_OUTPATIENT_CLINIC_OR_DEPARTMENT_OTHER): Payer: 59 | Admitting: Anesthesiology

## 2012-08-09 DIAGNOSIS — Z885 Allergy status to narcotic agent status: Secondary | ICD-10-CM | POA: Insufficient documentation

## 2012-08-09 DIAGNOSIS — F3289 Other specified depressive episodes: Secondary | ICD-10-CM | POA: Insufficient documentation

## 2012-08-09 DIAGNOSIS — Z794 Long term (current) use of insulin: Secondary | ICD-10-CM | POA: Insufficient documentation

## 2012-08-09 DIAGNOSIS — N138 Other obstructive and reflux uropathy: Secondary | ICD-10-CM | POA: Insufficient documentation

## 2012-08-09 DIAGNOSIS — E119 Type 2 diabetes mellitus without complications: Secondary | ICD-10-CM | POA: Insufficient documentation

## 2012-08-09 DIAGNOSIS — Z8673 Personal history of transient ischemic attack (TIA), and cerebral infarction without residual deficits: Secondary | ICD-10-CM | POA: Insufficient documentation

## 2012-08-09 DIAGNOSIS — E039 Hypothyroidism, unspecified: Secondary | ICD-10-CM | POA: Insufficient documentation

## 2012-08-09 DIAGNOSIS — Z87891 Personal history of nicotine dependence: Secondary | ICD-10-CM | POA: Insufficient documentation

## 2012-08-09 DIAGNOSIS — E7800 Pure hypercholesterolemia, unspecified: Secondary | ICD-10-CM | POA: Insufficient documentation

## 2012-08-09 DIAGNOSIS — R3911 Hesitancy of micturition: Secondary | ICD-10-CM | POA: Insufficient documentation

## 2012-08-09 DIAGNOSIS — Z79899 Other long term (current) drug therapy: Secondary | ICD-10-CM | POA: Insufficient documentation

## 2012-08-09 DIAGNOSIS — K219 Gastro-esophageal reflux disease without esophagitis: Secondary | ICD-10-CM | POA: Insufficient documentation

## 2012-08-09 DIAGNOSIS — I10 Essential (primary) hypertension: Secondary | ICD-10-CM | POA: Insufficient documentation

## 2012-08-09 DIAGNOSIS — D494 Neoplasm of unspecified behavior of bladder: Secondary | ICD-10-CM | POA: Diagnosis present

## 2012-08-09 DIAGNOSIS — Z888 Allergy status to other drugs, medicaments and biological substances status: Secondary | ICD-10-CM | POA: Insufficient documentation

## 2012-08-09 DIAGNOSIS — F411 Generalized anxiety disorder: Secondary | ICD-10-CM | POA: Insufficient documentation

## 2012-08-09 DIAGNOSIS — N529 Male erectile dysfunction, unspecified: Secondary | ICD-10-CM | POA: Insufficient documentation

## 2012-08-09 DIAGNOSIS — N401 Enlarged prostate with lower urinary tract symptoms: Secondary | ICD-10-CM | POA: Insufficient documentation

## 2012-08-09 DIAGNOSIS — C674 Malignant neoplasm of posterior wall of bladder: Secondary | ICD-10-CM | POA: Insufficient documentation

## 2012-08-09 DIAGNOSIS — F329 Major depressive disorder, single episode, unspecified: Secondary | ICD-10-CM | POA: Insufficient documentation

## 2012-08-09 DIAGNOSIS — Z882 Allergy status to sulfonamides status: Secondary | ICD-10-CM | POA: Insufficient documentation

## 2012-08-09 HISTORY — DX: Hyperlipidemia, unspecified: E78.5

## 2012-08-09 HISTORY — PX: TRANSURETHRAL RESECTION OF BLADDER TUMOR: SHX2575

## 2012-08-09 HISTORY — DX: Personal history of transient ischemic attack (TIA), and cerebral infarction without residual deficits: Z86.73

## 2012-08-09 HISTORY — DX: Type 2 diabetes mellitus with diabetic neuropathy, unspecified: E11.40

## 2012-08-09 HISTORY — DX: Personal history of urinary calculi: Z87.442

## 2012-08-09 HISTORY — DX: Hypothyroidism, unspecified: E03.9

## 2012-08-09 LAB — POCT I-STAT 4, (NA,K, GLUC, HGB,HCT)
Glucose, Bld: 274 mg/dL — ABNORMAL HIGH (ref 70–99)
HCT: 42 % (ref 39.0–52.0)
Hemoglobin: 14.3 g/dL (ref 13.0–17.0)
Potassium: 3.7 mEq/L (ref 3.5–5.1)
Sodium: 140 mEq/L (ref 135–145)

## 2012-08-09 LAB — GLUCOSE, CAPILLARY
Glucose-Capillary: 97 mg/dL (ref 70–99)
Glucose-Capillary: 92 mg/dL (ref 70–99)

## 2012-08-09 SURGERY — TURBT (TRANSURETHRAL RESECTION OF BLADDER TUMOR)
Anesthesia: General | Site: Bladder | Wound class: Clean Contaminated

## 2012-08-09 MED ORDER — KETOROLAC TROMETHAMINE 30 MG/ML IJ SOLN
15.0000 mg | Freq: Once | INTRAMUSCULAR | Status: DC | PRN
  Filled 2012-08-09: qty 1

## 2012-08-09 MED ORDER — MIDAZOLAM HCL 5 MG/5ML IJ SOLN
INTRAMUSCULAR | Status: DC | PRN
  Administered 2012-08-09: 1 mg via INTRAVENOUS

## 2012-08-09 MED ORDER — PHENAZOPYRIDINE HCL 200 MG PO TABS: 200.0000 mg | ORAL_TABLET | Freq: Three times a day (TID) | ORAL | Status: DC | PRN

## 2012-08-09 MED ORDER — DEXAMETHASONE SODIUM PHOSPHATE 4 MG/ML IJ SOLN
INTRAMUSCULAR | Status: DC | PRN
  Administered 2012-08-09: 4 mg via INTRAVENOUS

## 2012-08-09 MED ORDER — CIPROFLOXACIN IN D5W 200 MG/100ML IV SOLN
200.0000 mg | INTRAVENOUS | Status: AC
  Administered 2012-08-09: 200 mg via INTRAVENOUS
  Filled 2012-08-09: qty 100

## 2012-08-09 MED ORDER — SODIUM CHLORIDE 0.9 % IR SOLN
Status: DC | PRN
  Administered 2012-08-09: 6000 mL via INTRAVESICAL

## 2012-08-09 MED ORDER — MITOMYCIN CHEMO FOR BLADDER INSTILLATION 40 MG
40.0000 mg | Freq: Once | INTRAVENOUS | Status: AC
  Administered 2012-08-09: 40 mg via INTRAVESICAL
  Filled 2012-08-09: qty 40

## 2012-08-09 MED ORDER — OXYCODONE-ACETAMINOPHEN 10-325 MG PO TABS: 1.0000 | ORAL_TABLET | ORAL | Status: DC | PRN

## 2012-08-09 MED ORDER — EPHEDRINE SULFATE 50 MG/ML IJ SOLN
INTRAMUSCULAR | Status: DC | PRN
  Administered 2012-08-09 (×3): 10 mg via INTRAVENOUS

## 2012-08-09 MED ORDER — METOCLOPRAMIDE HCL 5 MG/ML IJ SOLN
INTRAMUSCULAR | Status: DC | PRN
  Administered 2012-08-09: 10 mg via INTRAVENOUS

## 2012-08-09 MED ORDER — FENTANYL CITRATE 0.05 MG/ML IJ SOLN
INTRAMUSCULAR | Status: DC | PRN
  Administered 2012-08-09: 25 ug via INTRAVENOUS
  Administered 2012-08-09: 50 ug via INTRAVENOUS
  Administered 2012-08-09: 25 ug via INTRAVENOUS

## 2012-08-09 MED ORDER — ONDANSETRON HCL 4 MG/2ML IJ SOLN
INTRAMUSCULAR | Status: DC | PRN
  Administered 2012-08-09: 4 mg via INTRAVENOUS

## 2012-08-09 MED ORDER — 0.9 % SODIUM CHLORIDE (POUR BTL) OPTIME
TOPICAL | Status: DC | PRN
  Administered 2012-08-09: 1000 mL

## 2012-08-09 MED ORDER — LACTATED RINGERS IV SOLN
INTRAVENOUS | Status: DC
  Administered 2012-08-09: 100 mL/h via INTRAVENOUS
  Administered 2012-08-09 (×2): via INTRAVENOUS
  Filled 2012-08-09: qty 1000

## 2012-08-09 MED ORDER — PROMETHAZINE HCL 25 MG/ML IJ SOLN
6.2500 mg | INTRAMUSCULAR | Status: DC | PRN
  Filled 2012-08-09: qty 1

## 2012-08-09 MED ORDER — PROPOFOL 10 MG/ML IV BOLUS
INTRAVENOUS | Status: DC | PRN
  Administered 2012-08-09: 250 mg via INTRAVENOUS

## 2012-08-09 MED ORDER — INSULIN ASPART 100 UNIT/ML ~~LOC~~ SOLN
10.0000 [IU] | Freq: Once | SUBCUTANEOUS | Status: AC
  Administered 2012-08-09: 10 [IU] via SUBCUTANEOUS
  Filled 2012-08-09: qty 0.1

## 2012-08-09 MED ORDER — FENTANYL CITRATE 0.05 MG/ML IJ SOLN
25.0000 ug | INTRAMUSCULAR | Status: DC | PRN
  Filled 2012-08-09: qty 1

## 2012-08-09 MED ORDER — LIDOCAINE HCL (CARDIAC) 20 MG/ML IV SOLN
INTRAVENOUS | Status: DC | PRN
  Administered 2012-08-09: 80 mg via INTRAVENOUS

## 2012-08-09 SURGICAL SUPPLY — 35 items
BAG DRAIN URO-CYSTO SKYTR STRL (DRAIN) ×2 IMPLANT
BAG DRN ANRFLXCHMBR STRAP LEK (BAG)
BAG DRN UROCATH (DRAIN) ×1
BAG URINE DRAINAGE (UROLOGICAL SUPPLIES) IMPLANT
BAG URINE LEG 19OZ MD ST LTX (BAG) IMPLANT
CANISTER SUCT LVC 12 LTR MEDI- (MISCELLANEOUS) ×2 IMPLANT
CATH FOLEY 2WAY SLVR  5CC 20FR (CATHETERS) ×1
CATH FOLEY 2WAY SLVR  5CC 22FR (CATHETERS)
CATH FOLEY 2WAY SLVR  5CC 24FR (CATHETERS) ×1
CATH FOLEY 2WAY SLVR 5CC 20FR (CATHETERS) IMPLANT
CATH FOLEY 2WAY SLVR 5CC 22FR (CATHETERS) IMPLANT
CATH FOLEY 2WAY SLVR 5CC 24FR (CATHETERS) ×1 IMPLANT
CLOTH BEACON ORANGE TIMEOUT ST (SAFETY) ×2 IMPLANT
DRAPE CAMERA CLOSED 9X96 (DRAPES) ×2 IMPLANT
ELECT BUTTON HF 24-28F 2 30DE (ELECTRODE) IMPLANT
ELECT LOOP MED HF 24F 12D (CUTTING LOOP) IMPLANT
ELECT LOOP MED HF 24F 12D CBL (CLIP) ×1 IMPLANT
ELECT REM PT RETURN 9FT ADLT (ELECTROSURGICAL) ×2
ELECT RESECT VAPORIZE 12D CBL (ELECTRODE) ×1 IMPLANT
ELECTRODE REM PT RTRN 9FT ADLT (ELECTROSURGICAL) ×1 IMPLANT
EVACUATOR MICROVAS BLADDER (UROLOGICAL SUPPLIES) ×1 IMPLANT
GLOVE BIO SURGEON STRL SZ 6 (GLOVE) ×1 IMPLANT
GLOVE BIO SURGEON STRL SZ 6.5 (GLOVE) ×2 IMPLANT
GLOVE BIO SURGEON STRL SZ7 (GLOVE) ×1 IMPLANT
GLOVE BIO SURGEON STRL SZ8 (GLOVE) ×2 IMPLANT
GLOVE ECLIPSE 7.0 STRL STRAW (GLOVE) ×1 IMPLANT
GOWN PREVENTION PLUS LG XLONG (DISPOSABLE) ×3 IMPLANT
GOWN STRL REIN XL XLG (GOWN DISPOSABLE) ×2 IMPLANT
HOLDER FOLEY CATH W/STRAP (MISCELLANEOUS) ×1 IMPLANT
IV NS IRRIG 3000ML ARTHROMATIC (IV SOLUTION) IMPLANT
KIT ASPIRATION TUBING (SET/KITS/TRAYS/PACK) ×1 IMPLANT
PACK CYSTOSCOPY (CUSTOM PROCEDURE TRAY) ×2 IMPLANT
PLUG CATH AND CAP STER (CATHETERS) ×1 IMPLANT
SET ASPIRATION TUBING (TUBING) IMPLANT
WATER STERILE IRR 3000ML UROMA (IV SOLUTION) IMPLANT

## 2012-08-09 NOTE — Anesthesia Preprocedure Evaluation (Addendum)
Anesthesia Evaluation  Patient identified by MRN, date of birth, ID band Patient awake    Reviewed: Allergy & Precautions, H&P , NPO status , Patient's Chart, lab work & pertinent test results  Airway Mallampati: II TM Distance: >3 FB Neck ROM: Full    Dental no notable dental hx.    Pulmonary COPDCurrent Smoker,  breath sounds clear to auscultation  Pulmonary exam normal       Cardiovascular negative cardio ROS  Rhythm:Regular Rate:Normal     Neuro/Psych TIAnegative psych ROS   GI/Hepatic Neg liver ROS, GERD-  Medicated,  Endo/Other  diabetes, Type 2Hypothyroidism   Renal/GU negative Renal ROS  negative genitourinary   Musculoskeletal negative musculoskeletal ROS (+)   Abdominal   Peds negative pediatric ROS (+)  Hematology negative hematology ROS (+)   Anesthesia Other Findings   Reproductive/Obstetrics negative OB ROS                           Anesthesia Physical Anesthesia Plan  ASA: III  Anesthesia Plan: General   Post-op Pain Management:    Induction: Intravenous  Airway Management Planned: LMA  Additional Equipment:   Intra-op Plan:   Post-operative Plan:   Informed Consent: I have reviewed the patients History and Physical, chart, labs and discussed the procedure including the risks, benefits and alternatives for the proposed anesthesia with the patient or authorized representative who has indicated his/her understanding and acceptance.   Dental advisory given  Plan Discussed with: CRNA and Surgeon  Anesthesia Plan Comments:         Anesthesia Quick Evaluation

## 2012-08-09 NOTE — Interval H&P Note (Signed)
History and Physical Interval Note:  08/09/2012 9:40 AM  Vincent [name]  has presented today for surgery, with the diagnosis of Bladder Tumor  The various methods of treatment have been discussed with the patient and family. After consideration of risks, benefits and other options for treatment, the patient has consented to  Procedure(s): TRANSURETHRAL RESECTION OF BLADDER TUMOR (TURBT) WITH GYRUS WITH MITOMYCIN C (N/A) as a surgical intervention .  The patient's history has been reviewed, patient examined, no change in status, stable for surgery.  I have reviewed the patient's chart and labs.  Questions were answered to the patient's satisfaction.     Garnett Farm

## 2012-08-09 NOTE — Anesthesia Postprocedure Evaluation (Signed)
  Anesthesia Post-op Note  Patient: Vincent [name]  Procedure(s) Performed: Procedure(s) (LRB): TRANSURETHRAL RESECTION OF BLADDER TUMOR (TURBT) WITH GYRUS WITH MITOMYCIN C (N/A)  Patient Location: PACU  Anesthesia Type: General  Level of Consciousness: awake and alert   Airway and Oxygen Therapy: Patient Spontanous Breathing  Post-op Pain: mild  Post-op Assessment: Post-op Vital signs reviewed, Patient's Cardiovascular Status Stable, Respiratory Function Stable, Patent Airway and No signs of Nausea or vomiting  Last Vitals:  Filed Vitals:   08/09/12 0834  BP: 128/86  Pulse: 82  Temp: 36.2 C  Resp: 18    Post-op Vital Signs: stable   Complications: No apparent anesthesia complications

## 2012-08-09 NOTE — Discharge Instructions (Signed)
Transurethral Resection of Bladder Tumor (TURBT)   Definition:  Transurethral Resection of the Bladder Tumor is a surgical procedure used to diagnose and remove tumors within the bladder. TURBT is the most common treatment for early stage bladder cancer.  General instructions:     Your recent bladder surgery requires very little post hospital care but some definite precautions.  Despite the fact that no skin incisions were used, the area around the bladder incisions are raw and covered with scabs to promote healing and prevent bleeding. Certain precautions are needed to insure that the scabs are not disturbed over the next 2-4 weeks while the healing proceeds.  Because the raw surface inside your bladder and the irritating effects of urine you may expect frequency of urination and/or urgency (a stronger desire to urinate) and perhaps even getting up at night more often. This will usually resolve or improve slowly over the healing period. You may see some blood in your urine over the first 6 weeks. Do not be alarmed, even if the urine was clear for a while. Get off your feet and drink lots of fluids until clearing occurs. If you start to pass clots or don't improve call us.  Catheter: (If you are discharged with a catheter.)  1. Keep your catheter secured to your leg at all times with tape or the supplied strap. 2. You may experience leakage of urine around your catheter- as long as the  catheter continues to drain, this is normal.  If your catheter stops draining  go to the ER. 3. You may also have blood in your urine, even after it has been clear for  several days; you may even pass some small blood clots or other material.  This  is normal as well.  If this happens, sit down and drink plenty of water to help  make urine to flush out your bladder.  If the blood in your urine becomes worse  after doing this, contact our office or return to the ER. 4. You may use the leg bag (small bag)  during the day, but use the large bag at  night.  Diet:  You may return to your normal diet immediately. Because of the raw surface of your bladder, alcohol, spicy foods, foods high in acid and drinks with caffeine may cause irritation or frequency and should be used in moderation. To keep your urine flowing freely and avoid constipation, drink plenty of fluids during the day (8-10 glasses). Tip: Avoid cranberry juice because it is very acidic.  Activity:  Your physical activity doesn't need to be restricted. However, if you are very active, you may see some blood in the urine. We suggest that you reduce your activity under the circumstances until the bleeding has stopped.  Bowels:  It is important to keep your bowels regular during the postoperative period. Straining with bowel movements can cause bleeding. A bowel movement every other day is reasonable. Use a mild laxative if needed, such as milk of magnesia 2-3 tablespoons, or 2 Dulcolax tablets. Call if you continue to have problems. If you had been taking narcotics for pain, before, during or after your surgery, you may be constipated. Take a laxative if necessary.    Medication:  You should resume your pre-surgery medications unless told not to. In addition you may be given an antibiotic to prevent or treat infection. Antibiotics are not always necessary. All medication should be taken as prescribed until the bottles are finished unless you are having   an unusual reaction to one of the drugs.    Post Anesthesia Home Care Instructions  Activity: Get plenty of rest for the remainder of the day. A responsible adult should stay with you for 24 hours following the procedure.  For the next 24 hours, DO NOT: -Drive a car -Operate machinery -Drink alcoholic beverages -Take any medication unless instructed by your physician -Make any legal decisions or sign important papers.  Meals: Start with liquid foods such as gelatin or soup.  Progress to regular foods as tolerated. Avoid greasy, spicy, heavy foods. If nausea and/or vomiting occur, drink only clear liquids until the nausea and/or vomiting subsides. Call your physician if vomiting continues.  Special Instructions/Symptoms: Your throat may feel dry or sore from the anesthesia or the breathing tube placed in your throat during surgery. If this causes discomfort, gargle with warm salt water. The discomfort should disappear within 24 hours.        

## 2012-08-09 NOTE — Op Note (Signed)
PATIENT:  Vincent [name]  PRE-OPERATIVE DIAGNOSIS: Bladder tumor  POST-OPERATIVE DIAGNOSIS: Same  PROCEDURE:  Procedure(s): TRANSURETHRAL RESECTION OF BLADDER TUMOR (TURBT) (3.0cm.)  SURGEON:  Surgeon(s): Garnett Farm  ANESTHESIA:   General  EBL:  Minimal  DRAINS: Urinary Catheter (20 Fr. Foley)   SPECIMEN:  Source of Specimen: 1. Bladder tumor 2. Base of bladder tumor.  DISPOSITION OF SPECIMEN:  PATHOLOGY  Indication:  [name] is a 47 year old male who was found to have a papillary bladder tumor located on the posterior left wall of the bladder cystoscopically which was performed for evaluation of microscopic hematuria. His upper tract study with CT scan and was found to be negative. He has, as a risk factor, a 60-pack-year smoking history.   Description of operation: The patient was taken to the operating room and administered general anesthesia. He was then placed on the table and moved to the dorsal lithotomy position after which his genitalia was sterilely prepped and draped. An official timeout was then performed.  The 26 French resectoscope with Timberlake obturator was then introduced into the bladder and the obturator was removed. The resectoscope element with 12 lens was then inserted and the bladder was fully and systematically inspected. Ureteral orifices were noted to be in the normal anatomic positions.   I first began by resecting the bladder tumor from the left posterior wall. It extended down onto the floor of the bladder and near the left ureteral orifice but did not involve the orifice in any way. After I resected all of the visible tumor I removed this portion of the tumor using the Microvasive evacuator. I stayed fairly superficial and therefore a great deal of the tumor was obliterated by the loop. Some portions of tumor were obtained however. I then resected the bladder wall deeper from the base of the bladder tumor.  Reinspection of the bladder revealed all  obvious tumor had been fully resected and there was no evidence of perforation. The Microvasive evacuator was then used to irrigate the bladder and remove all of the portions of  the base of the tumor which were sent to pathology. I then removed the resectoscope.  A 20 French Foley catheter was then inserted in the bladder and irrigated. The irrigant returned completely clear with no clots. The patient was awakened and taken to the recovery room.  While in the recovery room 40 mg of mitomycin-C in 40 cc of water were instilled in the bladder through the catheter and the catheter was plugged. This will remain indwelling for approximately one hour. It will then be drained from the bladder and the catheter will be removed and the patient discharged home.  PLAN OF CARE: Discharge to home after PACU  PATIENT DISPOSITION:  PACU - hemodynamically stable.

## 2012-08-09 NOTE — Transfer of Care (Signed)
Immediate Anesthesia Transfer of Care Note  Patient: Vincent [name]  Procedure(s) Performed: Procedure(s) (LRB): TRANSURETHRAL RESECTION OF BLADDER TUMOR (TURBT) WITH GYRUS WITH MITOMYCIN C (N/A)  Patient Location: PACU  Anesthesia Type: General  Level of Consciousness: awake, alert  and oriented  Airway & Oxygen Therapy: Patient Spontanous Breathing and Patient connected to face mask oxygen  Post-op Assessment: Report given to PACU RN and Post -op Vital signs reviewed and stable  Post vital signs: Reviewed and stable  Complications: No apparent anesthesia complications

## 2012-08-09 NOTE — Anesthesia Procedure Notes (Signed)
Procedure Name: LMA Insertion Date/Time: 08/09/2012 9:48 AM Performed by: Norva Pavlov Pre-anesthesia Checklist: Patient identified, Emergency Drugs available, Suction available and Patient being monitored Patient Re-evaluated:Patient Re-evaluated prior to inductionOxygen Delivery Method: Circle System Utilized Preoxygenation: Pre-oxygenation with 100% oxygen Intubation Type: IV induction Ventilation: Mask ventilation without difficulty LMA: LMA inserted LMA Size: 5.0 Number of attempts: 1 Airway Equipment and Method: bite block Placement Confirmation: positive ETCO2 Tube secured with: Tape Dental Injury: Teeth and Oropharynx as per pre-operative assessment

## 2012-08-12 ENCOUNTER — Encounter (HOSPITAL_BASED_OUTPATIENT_CLINIC_OR_DEPARTMENT_OTHER): Payer: Self-pay | Admitting: Urology

## 2012-08-19 DIAGNOSIS — R1310 Dysphagia, unspecified: Secondary | ICD-10-CM | POA: Insufficient documentation

## 2012-08-19 DIAGNOSIS — K3184 Gastroparesis: Secondary | ICD-10-CM | POA: Insufficient documentation

## 2012-09-03 ENCOUNTER — Encounter (HOSPITAL_COMMUNITY): Payer: Self-pay | Admitting: Emergency Medicine

## 2012-09-03 ENCOUNTER — Emergency Department (INDEPENDENT_AMBULATORY_CARE_PROVIDER_SITE_OTHER)
Admission: EM | Admit: 2012-09-03 | Discharge: 2012-09-03 | Disposition: A | Discharge status: Home / Self Care | Payer: 59 | Source: Home / Self Care | Attending: Family Medicine | Admitting: Family Medicine

## 2012-09-03 DIAGNOSIS — L089 Local infection of the skin and subcutaneous tissue, unspecified: Secondary | ICD-10-CM

## 2012-09-03 MED ORDER — MUPIROCIN CALCIUM 2 % EX CREA: TOPICAL_CREAM | Freq: Two times a day (BID) | CUTANEOUS | Status: DC

## 2012-09-03 MED ORDER — "CURITY NON-ADHERENT STRIPS 3""X3"" PADS": 1.0000 | MEDICATED_PAD | Freq: Two times a day (BID) | Status: DC

## 2012-09-03 MED ORDER — LINEZOLID 600 MG PO TABS: 600.0000 mg | ORAL_TABLET | Freq: Two times a day (BID) | ORAL | Status: DC

## 2012-09-03 MED ORDER — "CURITY NON-ADHERENT STRIPS 3""X8"" PADS": 2.0000 | MEDICATED_PAD | Freq: Two times a day (BID) | Status: DC

## 2012-09-03 NOTE — ED Notes (Signed)
Waiting discharge papers 

## 2012-09-03 NOTE — ED Notes (Signed)
Pt c/o bilateral skin lesion of hands and forearms.  Symptoms present x 8 months. Hx of diabetes. Antibiotic meds not working. pts wife states that vacomyicin worked the best.   Pt states that when area develop a scab that break open again from after showering or with every day use of hands and arms.  Pt states itch is relieved but having pain.

## 2012-09-03 NOTE — ED Provider Notes (Signed)
History    CSN: [id number] Arrival date & time 09/03/12  1733  First MD Initiated Contact with Patient 09/03/12 1755     Chief Complaint  Patient presents with  . Recurrent Skin Infections    bilateral skin infection of hands and fore arm.    (Consider location/radiation/quality/duration/timing/severity/associated sxs/prior Treatment) HPI Comments: 47 year old male with a history of type 2 diabetes mellitus presents with skin infection of his bilateral arms and hands that has been present for about 8 months. This infection has waxed and waned but has been getting worse in the past week. He has also been experiencing swelling of the hands in the mornings when he wakes up. He has seen numerous providers about this as well as a dermatologist but it does not seem to be getting better. He states that overnight, these lesions crust over and scab up and then in the morning as soon as he uses his hands the scabs breaking open and it starts bleeding again. The entire area is very sore. He denies any systemic symptoms including no fever, chills. These wounds have previously culture positive for MRSA. He has taken numerous antibiotics for this but it only seemed to get better with IV vancomycin.  Past Medical History  Diagnosis Date  . COPD (chronic obstructive pulmonary disease)   . GERD (gastroesophageal reflux disease)   . Hypercholesteremia   . Neuropathy in diabetes     LOWER EXTREMITIES  . Bladder tumor   . Hypothyroidism   . Hyperlipidemia   . History of TIA (transient ischemic attack) 2008--- NO RESIDUAL  . Diabetes mellitus, type 2   . H/O hiatal hernia   . History of kidney stones   . Skin sore     BILATERAL HANDS AND FOREARMS--  FROM HYDROCODONE ALLERGIC REACTION 1 YR AGO--  SLOW TO HEAL  . Gastric ulcer   . Urgency of urination   . Hematuria    Past Surgical History  Procedure Laterality Date  . Rotator cuff repair Right 12/2004    right  . Carpal tunnel release Right  09-16-2003  . Laparoscopic cholecystectomy  11-17-2010  . Cardiac catheterization  12-27-2001  DR Jacinto Halim  &  05-26-2009  DR VARANASI    RESULTS FOR BOTH ARE NORMAL CORONARIES AND PERSERVED LVF/ EF 60%  . Transurethral resection of bladder tumor N/A 08/09/2012    Procedure: TRANSURETHRAL RESECTION OF BLADDER TUMOR (TURBT) WITH GYRUS WITH MITOMYCIN C;  Surgeon: Garnett Farm, MD;  Location: [facility];  Service: Urology;  Laterality: N/A;   Family History  Problem Relation Age of Onset  . Diabetes Mother   . Diabetes Father   . Hypertension Father   . Kidney disease Father    History  Substance Use Topics  . Smoking status: Current Every Day Smoker -- 2.00 packs/day for 20 years    Types: Cigarettes  . Smokeless tobacco: Not on file  . Alcohol Use: Yes     Comment: RARE    Review of Systems  Constitutional: Negative for fever and chills.  HENT: Negative for sore throat, neck pain and neck stiffness.   Eyes: Negative for visual disturbance.  Respiratory: Negative for cough and shortness of breath.   Cardiovascular: Negative for chest pain, palpitations and leg swelling.  Gastrointestinal: Negative for nausea, vomiting, abdominal pain, diarrhea and constipation.  Genitourinary: Positive for hematuria (He is currently being treated for bladder cancer). Negative for dysuria, urgency and frequency.  Musculoskeletal: Negative for myalgias and arthralgias.  Skin: Positive for wound ( see history of present illness). Negative for rash.  Neurological: Negative for dizziness, weakness and light-headedness.    Allergies  Hydrocodone-acetaminophen; Celebrex; and Sulfa antibiotics  Home Medications   Current Outpatient Rx  Name  Route  Sig  Dispense  Refill  . ALBUTEROL IN   Inhalation   Inhale into the lungs as needed.         Marland Kitchen atorvastatin (LIPITOR) 40 MG tablet   Oral   Take 40 mg by mouth every morning.          . cholecalciferol (VITAMIN D) 1000 UNITS  tablet   Oral   Take 1,000 Units by mouth every morning.          . Coenzyme Q10 (CO Q 10) 10 MG CAPS   Oral   Take 1 tablet by mouth daily.          . fentaNYL (DURAGESIC - DOSED MCG/HR) 50 MCG/HR   Transdermal   Place 1 patch onto the skin every other day.         . fish oil-omega-3 fatty acids 1000 MG capsule   Oral   Take 1,000-2,000 mg by mouth 2 (two) times daily. Take 1 capsule in the morning and 2 capsules in the evening         . FLUoxetine (PROZAC) 40 MG capsule   Oral   Take 40 mg by mouth daily.         Marland Kitchen gabapentin (NEURONTIN) 300 MG capsule   Oral   Take 600 mg by mouth 4 (four) times daily.          Marland Kitchen HYDROmorphone (DILAUDID) 2 MG tablet   Oral   Take 2 mg by mouth every 4 (four) hours as needed for pain.         Marland Kitchen insulin lispro protamine-insulin lispro (HUMALOG 75/25) (75-25) 100 UNIT/ML SUSP   Subcutaneous   Inject 20-60 Units into the skin 3 (three) times daily before meals. Take 60 units in the morning and with dinner and 20 units at lunch         . levothyroxine (SYNTHROID, LEVOTHROID) 25 MCG tablet   Oral   Take 25 mcg by mouth daily before breakfast.          . Liraglutide (VICTOZA) 18 MG/3ML SOLN injection   Subcutaneous   Inject 1.2 mg into the skin every morning.          . metFORMIN (GLUCOPHAGE) 1000 MG tablet   Oral   Take 1,000 mg by mouth 2 (two) times daily with a meal.         . mupirocin ointment (BACTROBAN) 2 %   Topical   Apply topically 3 (three) times daily.   22 g   0   . omeprazole (PRILOSEC) 20 MG capsule   Oral   Take 20 mg by mouth every morning.          . pregabalin (LYRICA) 150 MG capsule   Oral   Take 150 mg by mouth 3 (three) times daily.         . vitamin B-12 (CYANOCOBALAMIN) 1000 MCG tablet   Oral   Take 1,000 mcg by mouth every morning.          . Gauze Pads & Dressings (CURITY NON-ADHERENT STRIPS) 3"X3" PADS   Does not apply   1 patch by Does not apply route 2 times daily at  12 noon and 4 pm.   30  each   1   . Gauze Pads & Dressings (CURITY NON-ADHERENT STRIPS) 3"X8" PADS   Does not apply   2 patches by Does not apply route 2 (two) times daily.   60 each   1   . linezolid (ZYVOX) 600 MG tablet   Oral   Take 1 tablet (600 mg total) by mouth every 12 (twelve) hours.   28 tablet   0   . mupirocin cream (BACTROBAN) 2 %   Topical   Apply topically 2 (two) times daily.   30 g   1   . oxyCODONE-acetaminophen (PERCOCET) 10-325 MG per tablet   Oral   Take 1 tablet by mouth every 4 (four) hours as needed for pain.   20 tablet   0   . phenazopyridine (PYRIDIUM) 200 MG tablet   Oral   Take 1 tablet (200 mg total) by mouth 3 (three) times daily as needed for pain.   30 tablet   0    BP 131/80  Pulse 75  Temp(Src) 98 F (36.7 C) (Oral)  Resp 18  SpO2 99% Physical Exam  Constitutional: He is oriented to person, place, and time. He appears well-developed and well-nourished. No distress.  HENT:  Head: Normocephalic and atraumatic.  Eyes: EOM are normal. Pupils are equal, round, and reactive to light.  Cardiovascular: Normal rate and regular rhythm.  Exam reveals no gallop and no friction rub.   No murmur heard. Pulmonary/Chest: Effort normal and breath sounds normal. No respiratory distress. He has no wheezes. He has no rales.  Abdominal: Soft. There is no tenderness.  Neurological: He is oriented to person, place, and time.  Skin: Skin is warm and dry.  Superficial skin infection that is starting to erode the outer layer of skin the subcutaneous tissue present on the bilateral lateral hands and on the bilateral forearms. There is no surrounding erythema or warmth. There is no discharge that can be expressed from these wounds  Psychiatric: He has a normal mood and affect. Judgment normal.    ED Course  Procedures (including critical care time) Labs Reviewed - No data to display No results found. 1. Skin infection    These wounds were each  clean, dress with Silvadene cream and a nonadherent dressing, wrapped in Kerlix dressings in place, and a sleeve was fashioned from the tube gauze.  MDM  He is instructed to dress these wounds as I have today twice daily and keep these wounds covered so that they can heal. He is instructed in 1-2 days to return to the emergency department if this is getting worse, or here if it is getting better or staying the same. If he is not making significant progress within a week we will refer to wound care. Starting on oral linezolid. Instructed to discontinue the linezolid and return if he starts to experience any symptoms of serotonin syndrome, reviewed with patient   Meds ordered this encounter  Medications  . linezolid (ZYVOX) 600 MG tablet    Sig: Take 1 tablet (600 mg total) by mouth every 12 (twelve) hours.    Dispense:  28 tablet    Refill:  0  . mupirocin cream (BACTROBAN) 2 %    Sig: Apply topically 2 (two) times daily.    Dispense:  30 g    Refill:  1  . Gauze Pads & Dressings (CURITY NON-ADHERENT STRIPS) 3"X3" PADS    Sig: 1 patch by Does not apply route 2 times daily at 12  noon and 4 pm.    Dispense:  30 each    Refill:  1  . Gauze Pads & Dressings (CURITY NON-ADHERENT STRIPS) 3"X8" PADS    Sig: 2 patches by Does not apply route 2 (two) times daily.    Dispense:  60 each    Refill:  1     Graylon Good, PA-C 09/03/12 2111

## 2012-09-07 ENCOUNTER — Emergency Department (HOSPITAL_COMMUNITY)
Admission: EM | Admit: 2012-09-07 | Discharge: 2012-09-07 | Disposition: A | Discharge status: Home / Self Care | Payer: 59 | Attending: Emergency Medicine | Admitting: Emergency Medicine

## 2012-09-07 ENCOUNTER — Encounter (HOSPITAL_COMMUNITY): Payer: Self-pay | Admitting: Emergency Medicine

## 2012-09-07 DIAGNOSIS — Z79899 Other long term (current) drug therapy: Secondary | ICD-10-CM | POA: Insufficient documentation

## 2012-09-07 DIAGNOSIS — J4489 Other specified chronic obstructive pulmonary disease: Secondary | ICD-10-CM | POA: Insufficient documentation

## 2012-09-07 DIAGNOSIS — E1149 Type 2 diabetes mellitus with other diabetic neurological complication: Secondary | ICD-10-CM | POA: Insufficient documentation

## 2012-09-07 DIAGNOSIS — R5381 Other malaise: Secondary | ICD-10-CM | POA: Insufficient documentation

## 2012-09-07 DIAGNOSIS — J449 Chronic obstructive pulmonary disease, unspecified: Secondary | ICD-10-CM | POA: Insufficient documentation

## 2012-09-07 DIAGNOSIS — E039 Hypothyroidism, unspecified: Secondary | ICD-10-CM | POA: Insufficient documentation

## 2012-09-07 DIAGNOSIS — R5383 Other fatigue: Secondary | ICD-10-CM

## 2012-09-07 DIAGNOSIS — Z8719 Personal history of other diseases of the digestive system: Secondary | ICD-10-CM | POA: Insufficient documentation

## 2012-09-07 DIAGNOSIS — K219 Gastro-esophageal reflux disease without esophagitis: Secondary | ICD-10-CM | POA: Insufficient documentation

## 2012-09-07 DIAGNOSIS — Z872 Personal history of diseases of the skin and subcutaneous tissue: Secondary | ICD-10-CM | POA: Insufficient documentation

## 2012-09-07 DIAGNOSIS — Z87442 Personal history of urinary calculi: Secondary | ICD-10-CM | POA: Insufficient documentation

## 2012-09-07 DIAGNOSIS — E785 Hyperlipidemia, unspecified: Secondary | ICD-10-CM | POA: Insufficient documentation

## 2012-09-07 DIAGNOSIS — Z8673 Personal history of transient ischemic attack (TIA), and cerebral infarction without residual deficits: Secondary | ICD-10-CM | POA: Insufficient documentation

## 2012-09-07 DIAGNOSIS — E1142 Type 2 diabetes mellitus with diabetic polyneuropathy: Secondary | ICD-10-CM | POA: Insufficient documentation

## 2012-09-07 DIAGNOSIS — Z9861 Coronary angioplasty status: Secondary | ICD-10-CM | POA: Insufficient documentation

## 2012-09-07 DIAGNOSIS — K3184 Gastroparesis: Secondary | ICD-10-CM | POA: Insufficient documentation

## 2012-09-07 DIAGNOSIS — Z8639 Personal history of other endocrine, nutritional and metabolic disease: Secondary | ICD-10-CM

## 2012-09-07 DIAGNOSIS — Z792 Long term (current) use of antibiotics: Secondary | ICD-10-CM | POA: Insufficient documentation

## 2012-09-07 DIAGNOSIS — F17200 Nicotine dependence, unspecified, uncomplicated: Secondary | ICD-10-CM | POA: Insufficient documentation

## 2012-09-07 DIAGNOSIS — Z87448 Personal history of other diseases of urinary system: Secondary | ICD-10-CM | POA: Insufficient documentation

## 2012-09-07 DIAGNOSIS — Z794 Long term (current) use of insulin: Secondary | ICD-10-CM | POA: Insufficient documentation

## 2012-09-07 MED ORDER — SODIUM CHLORIDE 0.9 % IV BOLUS (SEPSIS)
2000.0000 mL | Freq: Once | INTRAVENOUS | Status: AC
  Administered 2012-09-07: 2000 mL via INTRAVENOUS

## 2012-09-07 NOTE — ED Notes (Signed)
Pt has gastroparesis and states for the past four days he hasnt been able to eat/drink like normal and feels real weak.  Pt denies n/v.  He has this same episode every couple of months and sates after he gets IV fluids he feels better.

## 2012-09-07 NOTE — ED Notes (Signed)
Patient states that he is feeling better.

## 2012-09-07 NOTE — Discharge Instructions (Signed)
Weakness   Your exam shows you have weakness without a known cause. This may require further medical evaluation. Weakness can be caused by infections, physical exhaustion, internal bleeding, dehydration, medication side effects, and emotional upsets. Problems with circulation, heart disease, and nervous system disorders can also make you weak. Weakness can cause dizziness and fainting; this is usually due to a low blood pressure.   Full medical evaluation may require additional laboratory and x-ray tests, so be sure to see your doctor for follow up care as recommended. You should get plenty of rest and eat a nutritious diet over the next weeks. If you become very dizzy, nauseated, or feel like you are going to faint, please lie down flat right away and wait until all your symptoms have passed before you get up again. You should see your doctor or go to the emergency room right away if you have any of the following problems:   Severe headache, chest pain, or abdominal pain.   An irregular heartbeat or a very fast pulse (over 120 beats/minute).   Chest pain, chest pressure, and/or difficulty breathing.   Confusion, vision problems, difficulty walking, fever or chills.   Document Released: 02/27/2005 Document Revised: 02/16/2011 Document Reviewed: 08/09/2006   [employer]® Patient Information ©2012 [employer].

## 2012-09-07 NOTE — ED Provider Notes (Signed)
History    CSN: [id number] Arrival date & time 09/07/12  1356  First MD Initiated Contact with Patient 09/07/12 1634     Chief Complaint  Patient presents with  . Anorexia    loss appetite   (Consider location/radiation/quality/duration/timing/severity/associated sxs/prior Treatment) HPI Comments: Pt with h/o DM, gastroparesis, recurring skin infections that he is getting treatment for already, reports despite reglan and PPI, gastroparesis "flares up" and reports he gets full so quickly, can't eat or drink.  If he forces himself to eat, will get pain and n/V, so simply cannot take much, begins to feel weaker.  No CP, SOB, fevers, abd pain, N/V over past few days.  He has received IVF's in the past and felt much improved.  He requests the same.    Patient is a 47 y.o. male presenting with weakness. The history is provided by the patient.  Weakness This is a recurrent problem. The current episode started more than 2 days ago. The problem occurs constantly. The problem has not changed since onset.Pertinent negatives include no chest pain, no abdominal pain, no headaches and no shortness of breath. Nothing aggravates the symptoms. Nothing relieves the symptoms. Treatments tried: Taking usual medications. The treatment provided no relief.   Past Medical History  Diagnosis Date  . COPD (chronic obstructive pulmonary disease)   . GERD (gastroesophageal reflux disease)   . Hypercholesteremia   . Neuropathy in diabetes     LOWER EXTREMITIES  . Bladder tumor   . Hypothyroidism   . Hyperlipidemia   . History of TIA (transient ischemic attack) 2008--- NO RESIDUAL  . Diabetes mellitus, type 2   . H/O hiatal hernia   . History of kidney stones   . Skin sore     BILATERAL HANDS AND FOREARMS--  FROM HYDROCODONE ALLERGIC REACTION 1 YR AGO--  SLOW TO HEAL  . Gastric ulcer   . Urgency of urination   . Hematuria    Past Surgical History  Procedure Laterality Date  . Rotator cuff repair Right  12/2004    right  . Carpal tunnel release Right 09-16-2003  . Laparoscopic cholecystectomy  11-17-2010  . Cardiac catheterization  12-27-2001  DR Jacinto Halim  &  05-26-2009  DR VARANASI    RESULTS FOR BOTH ARE NORMAL CORONARIES AND PERSERVED LVF/ EF 60%  . Transurethral resection of bladder tumor N/A 08/09/2012    Procedure: TRANSURETHRAL RESECTION OF BLADDER TUMOR (TURBT) WITH GYRUS WITH MITOMYCIN C;  Surgeon: Garnett Farm, MD;  Location: [facility];  Service: Urology;  Laterality: N/A;   Family History  Problem Relation Age of Onset  . Diabetes Mother   . Diabetes Father   . Hypertension Father   . Kidney disease Father    History  Substance Use Topics  . Smoking status: Current Every Day Smoker -- 2.00 packs/day for 20 years    Types: Cigarettes  . Smokeless tobacco: Not on file  . Alcohol Use: Yes     Comment: RARE    Review of Systems  Constitutional: Positive for fatigue. Negative for fever and chills.  Respiratory: Negative for shortness of breath.   Cardiovascular: Negative for chest pain.  Gastrointestinal: Negative for nausea, vomiting and abdominal pain.  Neurological: Positive for weakness. Negative for headaches.  All other systems reviewed and are negative.    Allergies  Hydrocodone-acetaminophen; Celebrex; and Sulfa antibiotics  Home Medications   Current Outpatient Rx  Name  Route  Sig  Dispense  Refill  . ALBUTEROL  IN   Inhalation   Inhale into the lungs as needed.         Marland Kitchen atorvastatin (LIPITOR) 40 MG tablet   Oral   Take 40 mg by mouth every morning.          . cholecalciferol (VITAMIN D) 1000 UNITS tablet   Oral   Take 1,000 Units by mouth every morning.          . Coenzyme Q10 (CO Q 10) 10 MG CAPS   Oral   Take 1 tablet by mouth daily.          Marland Kitchen esomeprazole (NEXIUM) 20 MG capsule   Oral   Take 20 mg by mouth daily before breakfast.         . fish oil-omega-3 fatty acids 1000 MG capsule   Oral   Take  1,000-2,000 mg by mouth 2 (two) times daily. Take 1 capsule in the morning and 2 capsules in the evening         . FLUoxetine (PROZAC) 40 MG capsule   Oral   Take 40 mg by mouth daily.         Marland Kitchen gabapentin (NEURONTIN) 600 MG tablet   Oral   Take 600 mg by mouth 4 (four) times daily.         . Gauze Pads & Dressings (CURITY NON-ADHERENT STRIPS) 3"X3" PADS   Does not apply   1 patch by Does not apply route 2 times daily at 12 noon and 4 pm.   30 each   1   . Gauze Pads & Dressings (CURITY NON-ADHERENT STRIPS) 3"X8" PADS   Does not apply   2 patches by Does not apply route 2 (two) times daily.   60 each   1   . HYDROmorphone (DILAUDID) 2 MG tablet   Oral   Take 2 mg by mouth every 4 (four) hours as needed for pain.         Marland Kitchen insulin lispro protamine-insulin lispro (HUMALOG 75/25) (75-25) 100 UNIT/ML SUSP   Subcutaneous   Inject 20-60 Units into the skin 3 (three) times daily before meals. Take 60 units in the morning and with dinner and 20 units at lunch         . levothyroxine (SYNTHROID, LEVOTHROID) 25 MCG tablet   Oral   Take 25 mcg by mouth daily before breakfast.          . linezolid (ZYVOX) 600 MG tablet   Oral   Take 600 mg by mouth every 12 (twelve) hours. For 14 day regimen. Pt started on 09-03-12         . Liraglutide (VICTOZA) 18 MG/3ML SOLN injection   Subcutaneous   Inject 1.2 mg into the skin every morning.          . metFORMIN (GLUCOPHAGE) 1000 MG tablet   Oral   Take 1,000 mg by mouth 2 (two) times daily with a meal.         . metoCLOPramide (REGLAN) 5 MG tablet   Oral   Take 5 mg by mouth 4 (four) times daily.         . mupirocin cream (BACTROBAN) 2 %   Topical   Apply topically 2 (two) times daily.   30 g   1   . mupirocin ointment (BACTROBAN) 2 %   Topical   Apply topically 3 (three) times daily.   22 g   0   . pregabalin (LYRICA) 150 MG capsule  Oral   Take 150 mg by mouth 3 (three) times daily.         . vitamin  B-12 (CYANOCOBALAMIN) 1000 MCG tablet   Oral   Take 1,000 mcg by mouth every morning.          . fentaNYL (DURAGESIC - DOSED MCG/HR) 50 MCG/HR   Transdermal   Place 1 patch onto the skin every other day.          BP 115/70  Pulse 70  Temp(Src) 97.9 F (36.6 C) (Oral)  Resp 18  SpO2 97% Physical Exam  Nursing note and vitals reviewed. Constitutional: He is oriented to person, place, and time. He appears well-developed and well-nourished.  HENT:  Head: Normocephalic and atraumatic.  Eyes: Conjunctivae and EOM are normal. No scleral icterus.  Neck: Normal range of motion. Neck supple.  Cardiovascular: Normal rate, regular rhythm and intact distal pulses.   Pulmonary/Chest: Effort normal. No respiratory distress.  Abdominal: Soft. He exhibits no distension. There is no tenderness. There is no rebound and no guarding.  Neurological: He is alert and oriented to person, place, and time. He exhibits normal muscle tone. Coordination normal.  Skin: Skin is warm.  Wrists and forearms are wrapped in dressings  Psychiatric: He has a normal mood and affect.    ED Course  Procedures (including critical care time) Labs Reviewed - No data to display No results found. 1. Fatigue   2. H/O diabetic gastroparesis     ra sat is 100% and I interpret to be normal   6:44 PM Pt is feeling improved after IVF's, wants to go home.    MDM  Pt is not toxic appearing, VS are normal.  Pt with recurrent symptoms of the same, no appetite, not much PO intake, mild to moderate dehydration from poor intake, attributable to severe gastroparesis.  Given pt has DM and is on chronic pain meds, likely this is reason.  Will give IVF's and reassess.  If pt is improved, will d/c home and pt can follow up with PMD.  Pt has all oral meds at home.  Gavin Pound. Zakariya Knickerbocker, MD 09/07/12 1845

## 2012-09-07 NOTE — ED Notes (Signed)
Patient states that he has gastroparesis and it occurs about every two months.  Stating that he feels full. Eating makes him nauseous. No complaint of nausea right now just feels weak.

## 2012-09-17 NOTE — ED Provider Notes (Signed)
Medical screening examination/treatment/procedure(s) were performed by resident physician or non-physician practitioner and as supervising physician I was immediately available for consultation/collaboration.   Xiara Knisley DOUGLAS MD.   Leeann Bady D Margaretann Abate, MD 09/17/12 1102 

## 2012-09-18 ENCOUNTER — Encounter (HOSPITAL_COMMUNITY): Payer: Self-pay | Admitting: Emergency Medicine

## 2012-09-18 ENCOUNTER — Emergency Department (HOSPITAL_COMMUNITY)
Admission: EM | Admit: 2012-09-18 | Discharge: 2012-09-18 | Disposition: A | Discharge status: Home / Self Care | Payer: 59 | Attending: Emergency Medicine | Admitting: Emergency Medicine

## 2012-09-18 DIAGNOSIS — E7800 Pure hypercholesterolemia, unspecified: Secondary | ICD-10-CM | POA: Insufficient documentation

## 2012-09-18 DIAGNOSIS — Z87448 Personal history of other diseases of urinary system: Secondary | ICD-10-CM | POA: Insufficient documentation

## 2012-09-18 DIAGNOSIS — Z8719 Personal history of other diseases of the digestive system: Secondary | ICD-10-CM | POA: Insufficient documentation

## 2012-09-18 DIAGNOSIS — K219 Gastro-esophageal reflux disease without esophagitis: Secondary | ICD-10-CM | POA: Insufficient documentation

## 2012-09-18 DIAGNOSIS — Z8673 Personal history of transient ischemic attack (TIA), and cerebral infarction without residual deficits: Secondary | ICD-10-CM | POA: Insufficient documentation

## 2012-09-18 DIAGNOSIS — R319 Hematuria, unspecified: Secondary | ICD-10-CM | POA: Insufficient documentation

## 2012-09-18 DIAGNOSIS — Z872 Personal history of diseases of the skin and subcutaneous tissue: Secondary | ICD-10-CM | POA: Insufficient documentation

## 2012-09-18 DIAGNOSIS — Z8551 Personal history of malignant neoplasm of bladder: Secondary | ICD-10-CM | POA: Insufficient documentation

## 2012-09-18 DIAGNOSIS — Z87442 Personal history of urinary calculi: Secondary | ICD-10-CM | POA: Insufficient documentation

## 2012-09-18 DIAGNOSIS — Z79899 Other long term (current) drug therapy: Secondary | ICD-10-CM | POA: Insufficient documentation

## 2012-09-18 DIAGNOSIS — E1142 Type 2 diabetes mellitus with diabetic polyneuropathy: Secondary | ICD-10-CM | POA: Insufficient documentation

## 2012-09-18 DIAGNOSIS — F17200 Nicotine dependence, unspecified, uncomplicated: Secondary | ICD-10-CM | POA: Insufficient documentation

## 2012-09-18 DIAGNOSIS — R109 Unspecified abdominal pain: Secondary | ICD-10-CM | POA: Insufficient documentation

## 2012-09-18 DIAGNOSIS — Z794 Long term (current) use of insulin: Secondary | ICD-10-CM | POA: Insufficient documentation

## 2012-09-18 DIAGNOSIS — Z8711 Personal history of peptic ulcer disease: Secondary | ICD-10-CM | POA: Insufficient documentation

## 2012-09-18 DIAGNOSIS — E1149 Type 2 diabetes mellitus with other diabetic neurological complication: Secondary | ICD-10-CM | POA: Insufficient documentation

## 2012-09-18 DIAGNOSIS — J4489 Other specified chronic obstructive pulmonary disease: Secondary | ICD-10-CM | POA: Insufficient documentation

## 2012-09-18 DIAGNOSIS — J449 Chronic obstructive pulmonary disease, unspecified: Secondary | ICD-10-CM | POA: Insufficient documentation

## 2012-09-18 DIAGNOSIS — E039 Hypothyroidism, unspecified: Secondary | ICD-10-CM | POA: Insufficient documentation

## 2012-09-18 DIAGNOSIS — E785 Hyperlipidemia, unspecified: Secondary | ICD-10-CM | POA: Insufficient documentation

## 2012-09-18 DIAGNOSIS — R350 Frequency of micturition: Secondary | ICD-10-CM | POA: Insufficient documentation

## 2012-09-18 HISTORY — DX: Malignant (primary) neoplasm, unspecified: C80.1

## 2012-09-18 LAB — POCT I-STAT, CHEM 8
BUN: 10 mg/dL (ref 6–23)
Calcium, Ion: 1.19 mmol/L (ref 1.12–1.23)
Chloride: 99 mEq/L (ref 96–112)
Creatinine, Ser: 0.6 mg/dL (ref 0.50–1.35)
Glucose, Bld: 296 mg/dL — ABNORMAL HIGH (ref 70–99)
HCT: 44 % (ref 39.0–52.0)
Hemoglobin: 15 g/dL (ref 13.0–17.0)
Potassium: 4.2 mEq/L (ref 3.5–5.1)
Sodium: 136 mEq/L (ref 135–145)
TCO2: 25 mmol/L (ref 0–100)

## 2012-09-18 LAB — CBC WITH DIFFERENTIAL/PLATELET
Basophils Absolute: 0.1 10*3/uL (ref 0.0–0.1)
Basophils Relative: 1 % (ref 0–1)
Eosinophils Absolute: 0.1 10*3/uL (ref 0.0–0.7)
Eosinophils Relative: 1 % (ref 0–5)
HCT: 40.9 % (ref 39.0–52.0)
Hemoglobin: 14.8 g/dL (ref 13.0–17.0)
Lymphocytes Relative: 29 % (ref 12–46)
Lymphs Abs: 2.4 10*3/uL (ref 0.7–4.0)
MCH: 29.5 pg (ref 26.0–34.0)
MCHC: 36.2 g/dL — ABNORMAL HIGH (ref 30.0–36.0)
MCV: 81.6 fL (ref 78.0–100.0)
Monocytes Absolute: 0.7 10*3/uL (ref 0.1–1.0)
Monocytes Relative: 9 % (ref 3–12)
Neutro Abs: 5 10*3/uL (ref 1.7–7.7)
Neutrophils Relative %: 60 % (ref 43–77)
Platelets: 198 10*3/uL (ref 150–400)
RBC: 5.01 MIL/uL (ref 4.22–5.81)
RDW: 12.1 % (ref 11.5–15.5)
WBC: 8.2 10*3/uL (ref 4.0–10.5)

## 2012-09-18 LAB — URINALYSIS, ROUTINE W REFLEX MICROSCOPIC
Glucose, UA: 100 mg/dL — AB
Ketones, ur: NEGATIVE mg/dL
Nitrite: NEGATIVE
Protein, ur: >300 mg/dL — AB
Specific Gravity, Urine: 1.03 (ref 1.005–1.030)
Urobilinogen, UA: 1 mg/dL (ref 0.0–1.0)
pH: 5.5 (ref 5.0–8.0)

## 2012-09-18 LAB — URINE MICROSCOPIC-ADD ON

## 2012-09-18 MED ORDER — LIDOCAINE HCL 2 % EX GEL
CUTANEOUS | Status: AC
  Administered 2012-09-18: 1
  Filled 2012-09-18: qty 10

## 2012-09-18 NOTE — ED Notes (Signed)
I have just irrigated his bladder with a total of 500cc sterile saline with resultant clearing of urine.

## 2012-09-18 NOTE — ED Notes (Signed)
Foley irrigated again at this time with sterile saline with no clots obtained and performed without difficulty.  Total u.o. This visit (minus irrigant) is .  He remains in no distress; and is happy to have imminent d/c per Dr. Rubin Payor.

## 2012-09-18 NOTE — ED Notes (Addendum)
Pt states that he had surgery May 31st to remove a tumor on his bladder.  Pt states that everything cleared up and he has been fine but yesterday pt began having dark red blood and clots in his urine.  Pt c/o pain from the end of his penis to his abdomen.

## 2012-09-18 NOTE — ED Provider Notes (Signed)
History    CSN: [id number] Arrival date & time 09/18/12  0913  First MD Initiated Contact with Patient 09/18/12 0957     Chief Complaint  Patient presents with  . Hematuria   (Consider location/radiation/quality/duration/timing/severity/associated sxs/prior Treatment) Patient is a 47 y.o. male presenting with hematuria. The history is provided by the patient.  Hematuria Associated symptoms include abdominal pain. Pertinent negatives include no chest pain, no headaches and no shortness of breath.   patient had a bladder tumor removed the end of May. He states he had some chemotherapy placed in the area. She states that today he started to have blood in the urine. He states he's had a little bit of blood before but now does a lot of blood and clots. He states he has some lower abdominal pain. No other bleeding. He states it feels as if he has a kidney stone. Past Medical History  Diagnosis Date  . COPD (chronic obstructive pulmonary disease)   . GERD (gastroesophageal reflux disease)   . Hypercholesteremia   . Neuropathy in diabetes     LOWER EXTREMITIES  . Bladder tumor   . Hypothyroidism   . Hyperlipidemia   . History of TIA (transient ischemic attack) 2008--- NO RESIDUAL  . Diabetes mellitus, type 2   . H/O hiatal hernia   . History of kidney stones   . Skin sore     BILATERAL HANDS AND FOREARMS--  FROM HYDROCODONE ALLERGIC REACTION 1 YR AGO--  SLOW TO HEAL  . Gastric ulcer   . Urgency of urination   . Hematuria   . Cancer     bladder   Past Surgical History  Procedure Laterality Date  . Rotator cuff repair Right 12/2004    right  . Carpal tunnel release Right 09-16-2003  . Laparoscopic cholecystectomy  11-17-2010  . Cardiac catheterization  12-27-2001  DR Jacinto Halim  &  05-26-2009  DR VARANASI    RESULTS FOR BOTH ARE NORMAL CORONARIES AND PERSERVED LVF/ EF 60%  . Transurethral resection of bladder tumor N/A 08/09/2012    Procedure: TRANSURETHRAL RESECTION OF BLADDER TUMOR  (TURBT) WITH GYRUS WITH MITOMYCIN C;  Surgeon: Garnett Farm, MD;  Location: [facility];  Service: Urology;  Laterality: N/A;   Family History  Problem Relation Age of Onset  . Diabetes Mother   . Diabetes Father   . Hypertension Father   . Kidney disease Father    History  Substance Use Topics  . Smoking status: Current Every Day Smoker -- 2.00 packs/day for 20 years    Types: Cigarettes  . Smokeless tobacco: Not on file  . Alcohol Use: Yes     Comment: RARE    Review of Systems  Constitutional: Negative for activity change and appetite change.  HENT: Negative for neck stiffness.   Eyes: Negative for pain.  Respiratory: Negative for chest tightness and shortness of breath.   Cardiovascular: Negative for chest pain and leg swelling.  Gastrointestinal: Positive for abdominal pain. Negative for nausea, vomiting and diarrhea.  Genitourinary: Positive for frequency and hematuria. Negative for flank pain.  Musculoskeletal: Negative for back pain.  Skin: Negative for rash.  Neurological: Negative for weakness, numbness and headaches.  Hematological: Does not bruise/bleed easily.  Psychiatric/Behavioral: Negative for behavioral problems.    Allergies  Hydrocodone-acetaminophen; Celebrex; and Sulfa antibiotics  Home Medications   Current Outpatient Rx  Name  Route  Sig  Dispense  Refill  . ALBUTEROL IN   Inhalation   Inhale  into the lungs as needed.         Marland Kitchen atorvastatin (LIPITOR) 40 MG tablet   Oral   Take 40 mg by mouth every morning.          . cholecalciferol (VITAMIN D) 1000 UNITS tablet   Oral   Take 1,000 Units by mouth every morning.          . Coenzyme Q10 (CO Q 10) 10 MG CAPS   Oral   Take 1 tablet by mouth daily.          Marland Kitchen esomeprazole (NEXIUM) 20 MG capsule   Oral   Take 20 mg by mouth daily before breakfast.         . fentaNYL (DURAGESIC - DOSED MCG/HR) 50 MCG/HR   Transdermal   Place 1 patch onto the skin every other  day.         . fish oil-omega-3 fatty acids 1000 MG capsule   Oral   Take 1,000-2,000 mg by mouth 2 (two) times daily. Take 1 capsule in the morning and 2 capsules in the evening         . FLUoxetine (PROZAC) 40 MG capsule   Oral   Take 40 mg by mouth daily.         Marland Kitchen gabapentin (NEURONTIN) 600 MG tablet   Oral   Take 600 mg by mouth 4 (four) times daily.         . Gauze Pads & Dressings (CURITY NON-ADHERENT STRIPS) 3"X3" PADS   Does not apply   1 patch by Does not apply route 2 times daily at 12 noon and 4 pm.   30 each   1   . HYDROmorphone (DILAUDID) 2 MG tablet   Oral   Take 2 mg by mouth every 4 (four) hours as needed for pain.         Marland Kitchen insulin lispro protamine-insulin lispro (HUMALOG 75/25) (75-25) 100 UNIT/ML SUSP   Subcutaneous   Inject 20-60 Units into the skin 3 (three) times daily before meals. Take 60 units in the morning and with dinner and 20 units at lunch         . levothyroxine (SYNTHROID, LEVOTHROID) 25 MCG tablet   Oral   Take 25 mcg by mouth daily before breakfast.          . linezolid (ZYVOX) 600 MG tablet   Oral   Take 600 mg by mouth every 12 (twelve) hours. For 14 day regimen. Pt started on 09-03-12         . Liraglutide (VICTOZA) 18 MG/3ML SOLN injection   Subcutaneous   Inject 1.2 mg into the skin every morning.          . metFORMIN (GLUCOPHAGE) 1000 MG tablet   Oral   Take 1,000 mg by mouth 2 (two) times daily with a meal.         . metoCLOPramide (REGLAN) 5 MG tablet   Oral   Take 5 mg by mouth 4 (four) times daily.         . mupirocin ointment (BACTROBAN) 2 %   Topical   Apply topically 3 (three) times daily.   22 g   0   . pregabalin (LYRICA) 150 MG capsule   Oral   Take 150 mg by mouth 3 (three) times daily.         . vitamin B-12 (CYANOCOBALAMIN) 1000 MCG tablet   Oral   Take 1,000 mcg by mouth every morning.          Marland Kitchen  Gauze Pads & Dressings (CURITY NON-ADHERENT STRIPS) 3"X8" PADS   Does not  apply   2 patches by Does not apply route 2 (two) times daily.   60 each   1   . mupirocin cream (BACTROBAN) 2 %   Topical   Apply topically 2 (two) times daily.   30 g   1    BP 131/86  Pulse 71  Temp(Src) 98.3 F (36.8 C) (Oral)  Resp 16  Ht 6' (1.829 m)  Wt 190 lb (86.183 kg)  BMI 25.76 kg/m2  SpO2 95% Physical Exam  Nursing note and vitals reviewed. Constitutional: He is oriented to person, place, and time. He appears well-developed and well-nourished.  HENT:  Head: Normocephalic and atraumatic.  Eyes: EOM are normal. Pupils are equal, round, and reactive to light.  Neck: Normal range of motion. Neck supple.  Cardiovascular: Normal rate, regular rhythm and normal heart sounds.   No murmur heard. Pulmonary/Chest: Effort normal and breath sounds normal.  Abdominal: Soft. Bowel sounds are normal. He exhibits no distension and no mass. There is tenderness. There is no rebound and no guarding.  Mild suprapubic tenderness without rebound or guarding.  Genitourinary: Penis normal.  Musculoskeletal: Normal range of motion. He exhibits no edema.  Neurological: He is alert and oriented to person, place, and time. No cranial nerve deficit.  Skin: Skin is warm and dry.  Psychiatric: He has a normal mood and affect.    ED Course  Procedures (including critical care time) Labs Reviewed  URINALYSIS, ROUTINE W REFLEX MICROSCOPIC - Abnormal; Notable for the following:    Color, Urine RED (*)    APPearance CLOUDY (*)    Glucose, UA 100 (*)    Hgb urine dipstick LARGE (*)    Bilirubin Urine SMALL (*)    Protein, ur >300 (*)    Leukocytes, UA SMALL (*)    All other components within normal limits  CBC WITH DIFFERENTIAL - Abnormal; Notable for the following:    MCHC 36.2 (*)    All other components within normal limits  POCT I-STAT, CHEM 8 - Abnormal; Notable for the following:    Glucose, Bld 296 (*)    All other components within normal limits  URINE MICROSCOPIC-ADD ON    No results found. 1. Hematuria     MDM  Patient with hematuria. History of bladder tumor. No clots a Foley radiation. Still some pink urine. Discussed with patient's urologist who will see in followup. He will D/c her  Juliet Rude. Rubin Payor, MD 09/18/12 1257

## 2012-10-06 ENCOUNTER — Encounter (HOSPITAL_COMMUNITY): Payer: Self-pay | Admitting: Emergency Medicine

## 2012-10-06 ENCOUNTER — Emergency Department (HOSPITAL_COMMUNITY)
Admission: EM | Admit: 2012-10-06 | Discharge: 2012-10-06 | Disposition: A | Discharge status: Home / Self Care | Payer: 59 | Attending: Emergency Medicine | Admitting: Emergency Medicine

## 2012-10-06 DIAGNOSIS — Z872 Personal history of diseases of the skin and subcutaneous tissue: Secondary | ICD-10-CM | POA: Insufficient documentation

## 2012-10-06 DIAGNOSIS — R109 Unspecified abdominal pain: Secondary | ICD-10-CM | POA: Insufficient documentation

## 2012-10-06 DIAGNOSIS — F17200 Nicotine dependence, unspecified, uncomplicated: Secondary | ICD-10-CM | POA: Insufficient documentation

## 2012-10-06 DIAGNOSIS — E1149 Type 2 diabetes mellitus with other diabetic neurological complication: Secondary | ICD-10-CM | POA: Insufficient documentation

## 2012-10-06 DIAGNOSIS — E1142 Type 2 diabetes mellitus with diabetic polyneuropathy: Secondary | ICD-10-CM | POA: Insufficient documentation

## 2012-10-06 DIAGNOSIS — Z8551 Personal history of malignant neoplasm of bladder: Secondary | ICD-10-CM | POA: Insufficient documentation

## 2012-10-06 DIAGNOSIS — Z794 Long term (current) use of insulin: Secondary | ICD-10-CM | POA: Insufficient documentation

## 2012-10-06 DIAGNOSIS — Z9089 Acquired absence of other organs: Secondary | ICD-10-CM | POA: Insufficient documentation

## 2012-10-06 DIAGNOSIS — Z8719 Personal history of other diseases of the digestive system: Secondary | ICD-10-CM | POA: Insufficient documentation

## 2012-10-06 DIAGNOSIS — Z79899 Other long term (current) drug therapy: Secondary | ICD-10-CM | POA: Insufficient documentation

## 2012-10-06 DIAGNOSIS — J4489 Other specified chronic obstructive pulmonary disease: Secondary | ICD-10-CM | POA: Insufficient documentation

## 2012-10-06 DIAGNOSIS — R3915 Urgency of urination: Secondary | ICD-10-CM | POA: Insufficient documentation

## 2012-10-06 DIAGNOSIS — Z87442 Personal history of urinary calculi: Secondary | ICD-10-CM | POA: Insufficient documentation

## 2012-10-06 DIAGNOSIS — E785 Hyperlipidemia, unspecified: Secondary | ICD-10-CM | POA: Insufficient documentation

## 2012-10-06 DIAGNOSIS — Z8673 Personal history of transient ischemic attack (TIA), and cerebral infarction without residual deficits: Secondary | ICD-10-CM | POA: Insufficient documentation

## 2012-10-06 DIAGNOSIS — R319 Hematuria, unspecified: Secondary | ICD-10-CM | POA: Insufficient documentation

## 2012-10-06 DIAGNOSIS — K219 Gastro-esophageal reflux disease without esophagitis: Secondary | ICD-10-CM | POA: Insufficient documentation

## 2012-10-06 DIAGNOSIS — R350 Frequency of micturition: Secondary | ICD-10-CM | POA: Insufficient documentation

## 2012-10-06 DIAGNOSIS — E039 Hypothyroidism, unspecified: Secondary | ICD-10-CM | POA: Insufficient documentation

## 2012-10-06 DIAGNOSIS — J449 Chronic obstructive pulmonary disease, unspecified: Secondary | ICD-10-CM | POA: Insufficient documentation

## 2012-10-06 LAB — URINALYSIS, ROUTINE W REFLEX MICROSCOPIC
Bilirubin Urine: NEGATIVE
Glucose, UA: >1000 mg/dL — AB
Ketones, ur: NEGATIVE mg/dL
Nitrite: NEGATIVE
Protein, ur: >300 mg/dL — AB
Specific Gravity, Urine: 1.034 — ABNORMAL HIGH (ref 1.005–1.030)
Urobilinogen, UA: 1 mg/dL (ref 0.0–1.0)
pH: 6 (ref 5.0–8.0)

## 2012-10-06 LAB — URINE MICROSCOPIC-ADD ON

## 2012-10-06 MED ORDER — CEPHALEXIN 500 MG PO CAPS: 500.0000 mg | ORAL_CAPSULE | Freq: Four times a day (QID) | ORAL | Status: DC

## 2012-10-06 NOTE — ED Provider Notes (Addendum)
CSN: [id number]     Arrival date & time 10/06/12  6213 History     First MD Initiated Contact with Patient 10/06/12 0740     Chief Complaint  Patient presents with  . Hematuria   (Consider location/radiation/quality/duration/timing/severity/associated sxs/prior Treatment) HPI Comments: 47 year old male presenting with hematuria. He states bladder surgery approximately 2 months ago, and since then has been having some hematuria since then. He was evaluated in the emergency department approximately 3 weeks ago, and has had hematuria everyday since then. He is now passing larger clots, which interfere with his voiding. He states that he is able to void, but it is difficult and painful. He also endorses urinary frequency and urgency.  Patient is a 47 y.o. male presenting with hematuria.  Hematuria This is a recurrent problem. Episode onset: more than 3 weeks ago. The problem occurs constantly. The problem has been gradually worsening. Associated symptoms include abdominal pain. Pertinent negatives include no chest pain and no shortness of breath. Nothing aggravates the symptoms. Nothing relieves the symptoms.    Past Medical History  Diagnosis Date  . COPD (chronic obstructive pulmonary disease)   . GERD (gastroesophageal reflux disease)   . Hypercholesteremia   . Neuropathy in diabetes     LOWER EXTREMITIES  . Bladder tumor   . Hypothyroidism   . Hyperlipidemia   . History of TIA (transient ischemic attack) 2008--- NO RESIDUAL  . Diabetes mellitus, type 2   . H/O hiatal hernia   . History of kidney stones   . Skin sore     BILATERAL HANDS AND FOREARMS--  FROM HYDROCODONE ALLERGIC REACTION 1 YR AGO--  SLOW TO HEAL  . Gastric ulcer   . Urgency of urination   . Hematuria   . Cancer     bladder   Past Surgical History  Procedure Laterality Date  . Rotator cuff repair Right 12/2004    right  . Carpal tunnel release Right 09-16-2003  . Laparoscopic cholecystectomy  11-17-2010  .  Cardiac catheterization  12-27-2001  DR Jacinto Halim  &  05-26-2009  DR VARANASI    RESULTS FOR BOTH ARE NORMAL CORONARIES AND PERSERVED LVF/ EF 60%  . Transurethral resection of bladder tumor N/A 08/09/2012    Procedure: TRANSURETHRAL RESECTION OF BLADDER TUMOR (TURBT) WITH GYRUS WITH MITOMYCIN C;  Surgeon: Garnett Farm, MD;  Location: [facility];  Service: Urology;  Laterality: N/A;   Family History  Problem Relation Age of Onset  . Diabetes Mother   . Diabetes Father   . Hypertension Father   . Kidney disease Father    History  Substance Use Topics  . Smoking status: Current Every Day Smoker -- 2.00 packs/day for 20 years    Types: Cigarettes  . Smokeless tobacco: Not on file  . Alcohol Use: Yes     Comment: RARE    Review of Systems  Constitutional: Negative for fever.  HENT: Negative for congestion.   Respiratory: Negative for cough and shortness of breath.   Cardiovascular: Negative for chest pain.  Gastrointestinal: Positive for abdominal pain. Negative for nausea, vomiting and diarrhea.  Genitourinary: Positive for hematuria.  All other systems reviewed and are negative.    Allergies  Hydrocodone-acetaminophen; Celebrex; and Sulfa antibiotics  Home Medications   Current Outpatient Rx  Name  Route  Sig  Dispense  Refill  . ALBUTEROL IN   Inhalation   Inhale into the lungs as needed.         Marland Kitchen atorvastatin (  LIPITOR) 40 MG tablet   Oral   Take 40 mg by mouth every morning.          . cholecalciferol (VITAMIN D) 1000 UNITS tablet   Oral   Take 1,000 Units by mouth every morning.          . Coenzyme Q10 (CO Q 10) 10 MG CAPS   Oral   Take 1 tablet by mouth daily.          Marland Kitchen esomeprazole (NEXIUM) 20 MG capsule   Oral   Take 20 mg by mouth daily before breakfast.         . fentaNYL (DURAGESIC - DOSED MCG/HR) 50 MCG/HR   Transdermal   Place 1 patch onto the skin every other day.         . fish oil-omega-3 fatty acids 1000 MG  capsule   Oral   Take 1,000-2,000 mg by mouth 2 (two) times daily. Take 1 capsule in the morning and 2 capsules in the evening         . FLUoxetine (PROZAC) 40 MG capsule   Oral   Take 40 mg by mouth daily.         Marland Kitchen gabapentin (NEURONTIN) 600 MG tablet   Oral   Take 600 mg by mouth 4 (four) times daily.         . Gauze Pads & Dressings (CURITY NON-ADHERENT STRIPS) 3"X3" PADS   Does not apply   1 patch by Does not apply route 2 times daily at 12 noon and 4 pm.   30 each   1   . Gauze Pads & Dressings (CURITY NON-ADHERENT STRIPS) 3"X8" PADS   Does not apply   2 patches by Does not apply route 2 (two) times daily.   60 each   1   . HYDROmorphone (DILAUDID) 2 MG tablet   Oral   Take 2 mg by mouth every 4 (four) hours as needed for pain.         Marland Kitchen insulin lispro protamine-insulin lispro (HUMALOG 75/25) (75-25) 100 UNIT/ML SUSP   Subcutaneous   Inject 20-60 Units into the skin 3 (three) times daily before meals. Take 60 units in the morning and with dinner and 20 units at lunch         . levothyroxine (SYNTHROID, LEVOTHROID) 25 MCG tablet   Oral   Take 25 mcg by mouth daily before breakfast.          . linezolid (ZYVOX) 600 MG tablet   Oral   Take 600 mg by mouth every 12 (twelve) hours. For 14 day regimen. Pt started on 09-03-12         . Liraglutide (VICTOZA) 18 MG/3ML SOLN injection   Subcutaneous   Inject 1.2 mg into the skin every morning.          . metFORMIN (GLUCOPHAGE) 1000 MG tablet   Oral   Take 1,000 mg by mouth 2 (two) times daily with a meal.         . metoCLOPramide (REGLAN) 5 MG tablet   Oral   Take 5 mg by mouth 4 (four) times daily.         . mupirocin cream (BACTROBAN) 2 %   Topical   Apply topically 2 (two) times daily.   30 g   1   . mupirocin ointment (BACTROBAN) 2 %   Topical   Apply topically 3 (three) times daily.   22 g   0   .  pregabalin (LYRICA) 150 MG capsule   Oral   Take 150 mg by mouth 3 (three) times  daily.         . vitamin B-12 (CYANOCOBALAMIN) 1000 MCG tablet   Oral   Take 1,000 mcg by mouth every morning.           BP 144/85  Pulse 89  Temp(Src) 97.9 F (36.6 C) (Oral)  Resp 20  SpO2 97% Physical Exam  Nursing note and vitals reviewed. Constitutional: He is oriented to person, place, and time. He appears well-developed and well-nourished. No distress.  HENT:  Head: Normocephalic and atraumatic.  Mouth/Throat: Oropharynx is clear and moist.  Eyes: Conjunctivae are normal. Pupils are equal, round, and reactive to light. No scleral icterus.  Neck: Neck supple.  Cardiovascular: Normal rate, regular rhythm, normal heart sounds and intact distal pulses.   No murmur heard. Pulmonary/Chest: Effort normal and breath sounds normal. No stridor. No respiratory distress. He has no wheezes. He has no rales.  Abdominal: Soft. He exhibits no distension. There is tenderness in the right lower quadrant, suprapubic area and left lower quadrant. There is no rigidity, no rebound, no guarding and no CVA tenderness.  Musculoskeletal: Normal range of motion. He exhibits no edema.  Neurological: He is alert and oriented to person, place, and time.  Skin: Skin is warm and dry. No rash noted.  Psychiatric: He has a normal mood and affect. His behavior is normal.    ED Course   Procedures (including critical care time)  Labs Reviewed  URINALYSIS, ROUTINE W REFLEX MICROSCOPIC - Abnormal; Notable for the following:    Color, Urine RED (*)    APPearance TURBID (*)    Specific Gravity, Urine 1.034 (*)    Glucose, UA >1000 (*)    Hgb urine dipstick LARGE (*)    Protein, ur >300 (*)    Leukocytes, UA SMALL (*)    All other components within normal limits  URINE MICROSCOPIC-ADD ON   No results found. 1. Hematuria     MDM  47 year old male presenting with hematuria. This is in the setting of bladder surgery 2 months ago. He has had hematuria since then which seems to be getting worse. He  endorses urinary frequency and urgency. His abdominal exam shows some mild suprapubic tenderness with no peritoneal signs. His urinalysis was limited secondary to degree of hematuria. Post void residual was less than 20 cc.  However, with his worsening symptoms, and to treat with Keflex. Strongly advise further urology followup.  Candyce Churn, MD 10/06/12 1015  Candyce Churn, MD 10/06/12 1016

## 2012-10-06 NOTE — ED Notes (Signed)
Pt states he has been passing blood clots through his urine for about 2 months and has gotten worse over the past two days to the point where he has abdominal pain and urination stops mid stream. States that urine is blood tinged as well.

## 2012-10-07 ENCOUNTER — Encounter (HOSPITAL_COMMUNITY): Payer: Self-pay | Admitting: Emergency Medicine

## 2012-10-07 ENCOUNTER — Observation Stay (HOSPITAL_COMMUNITY)
Admission: EM | Admit: 2012-10-07 | Discharge: 2012-10-09 | Disposition: A | Discharge status: Home / Self Care | Payer: 59 | Attending: Urology | Admitting: Urology

## 2012-10-07 DIAGNOSIS — C679 Malignant neoplasm of bladder, unspecified: Secondary | ICD-10-CM | POA: Insufficient documentation

## 2012-10-07 DIAGNOSIS — J4489 Other specified chronic obstructive pulmonary disease: Secondary | ICD-10-CM | POA: Insufficient documentation

## 2012-10-07 DIAGNOSIS — E43 Unspecified severe protein-calorie malnutrition: Secondary | ICD-10-CM | POA: Insufficient documentation

## 2012-10-07 DIAGNOSIS — E1142 Type 2 diabetes mellitus with diabetic polyneuropathy: Secondary | ICD-10-CM | POA: Insufficient documentation

## 2012-10-07 DIAGNOSIS — K3184 Gastroparesis: Secondary | ICD-10-CM | POA: Insufficient documentation

## 2012-10-07 DIAGNOSIS — E7800 Pure hypercholesterolemia, unspecified: Secondary | ICD-10-CM | POA: Insufficient documentation

## 2012-10-07 DIAGNOSIS — F17200 Nicotine dependence, unspecified, uncomplicated: Secondary | ICD-10-CM | POA: Insufficient documentation

## 2012-10-07 DIAGNOSIS — J449 Chronic obstructive pulmonary disease, unspecified: Secondary | ICD-10-CM | POA: Insufficient documentation

## 2012-10-07 DIAGNOSIS — Y846 Urinary catheterization as the cause of abnormal reaction of the patient, or of later complication, without mention of misadventure at the time of the procedure: Secondary | ICD-10-CM | POA: Insufficient documentation

## 2012-10-07 DIAGNOSIS — T83091A Other mechanical complication of indwelling urethral catheter, initial encounter: Secondary | ICD-10-CM | POA: Insufficient documentation

## 2012-10-07 DIAGNOSIS — R319 Hematuria, unspecified: Secondary | ICD-10-CM

## 2012-10-07 DIAGNOSIS — K219 Gastro-esophageal reflux disease without esophagitis: Secondary | ICD-10-CM | POA: Insufficient documentation

## 2012-10-07 DIAGNOSIS — Z8673 Personal history of transient ischemic attack (TIA), and cerebral infarction without residual deficits: Secondary | ICD-10-CM | POA: Insufficient documentation

## 2012-10-07 DIAGNOSIS — R310 Gross hematuria: Principal | ICD-10-CM | POA: Insufficient documentation

## 2012-10-07 DIAGNOSIS — E1149 Type 2 diabetes mellitus with other diabetic neurological complication: Secondary | ICD-10-CM | POA: Insufficient documentation

## 2012-10-07 DIAGNOSIS — E785 Hyperlipidemia, unspecified: Secondary | ICD-10-CM | POA: Insufficient documentation

## 2012-10-07 DIAGNOSIS — E039 Hypothyroidism, unspecified: Secondary | ICD-10-CM | POA: Insufficient documentation

## 2012-10-07 HISTORY — DX: Gastroparesis: K31.84

## 2012-10-07 NOTE — ED Notes (Signed)
Was seen here yesterday, unable to urinate, went to urology this morning and had catheter placed, foley is clogged w/ a blood clot again, family ran out of saline when trying to flush catheter.

## 2012-10-07 NOTE — ED Notes (Signed)
Patient has urine draining.

## 2012-10-07 NOTE — ED Notes (Signed)
ZOX:[id number]<VW> Expected date:10/07/12<BR> Expected time: 9:17 PM<BR> Means of arrival:Ambulance<BR> Comments:<BR> 47 yo M  Foley cath not draining  Triage

## 2012-10-08 LAB — BASIC METABOLIC PANEL
BUN: 16 mg/dL (ref 6–23)
CO2: 30 mEq/L (ref 19–32)
Calcium: 9.3 mg/dL (ref 8.4–10.5)
Chloride: 98 mEq/L (ref 96–112)
Creatinine, Ser: 0.75 mg/dL (ref 0.50–1.35)
GFR calc Af Amer: >90 mL/min (ref >90)
GFR calc non Af Amer: >90 mL/min (ref >90)
Glucose, Bld: 318 mg/dL — ABNORMAL HIGH (ref 70–99)
Potassium: 4.1 mEq/L (ref 3.5–5.1)
Sodium: 136 mEq/L (ref 135–145)

## 2012-10-08 LAB — CBC
HCT: 38.6 % — ABNORMAL LOW (ref 39.0–52.0)
Hemoglobin: 13.8 g/dL (ref 13.0–17.0)
MCH: 29.2 pg (ref 26.0–34.0)
MCHC: 35.8 g/dL (ref 30.0–36.0)
MCV: 81.8 fL (ref 78.0–100.0)
Platelets: 230 10*3/uL (ref 150–400)
RBC: 4.72 MIL/uL (ref 4.22–5.81)
RDW: 12 % (ref 11.5–15.5)
WBC: 10 10*3/uL (ref 4.0–10.5)

## 2012-10-08 LAB — PROTIME-INR
INR: 0.9 (ref 0.00–1.49)
Prothrombin Time: 12 seconds (ref 11.6–15.2)

## 2012-10-08 LAB — APTT: aPTT: 31 seconds (ref 24–37)

## 2012-10-08 MED ORDER — SODIUM CHLORIDE 0.9 % IR SOLN
3000.0000 mL | Status: DC
  Administered 2012-10-08: 3000 mL

## 2012-10-08 MED ORDER — FLUOXETINE HCL 20 MG PO CAPS
40.0000 mg | ORAL_CAPSULE | Freq: Every day | ORAL | Status: DC
Start: 2012-10-08 — End: 2012-10-09
  Administered 2012-10-08 – 2012-10-09 (×2): 40 mg via ORAL
  Filled 2012-10-08 (×2): qty 2

## 2012-10-08 MED ORDER — GABAPENTIN 600 MG PO TABS
600.0000 mg | ORAL_TABLET | Freq: Four times a day (QID) | ORAL | Status: DC
  Filled 2012-10-08 (×4): qty 1

## 2012-10-08 MED ORDER — OXYCODONE HCL 5 MG PO TABS: 5.0000 mg | ORAL_TABLET | ORAL | Status: DC | PRN

## 2012-10-08 MED ORDER — ATORVASTATIN CALCIUM 40 MG PO TABS
40.0000 mg | ORAL_TABLET | Freq: Every morning | ORAL | Status: DC
  Administered 2012-10-08 – 2012-10-09 (×2): 40 mg via ORAL
  Filled 2012-10-08 (×2): qty 1

## 2012-10-08 MED ORDER — PREGABALIN 75 MG PO CAPS
150.0000 mg | ORAL_CAPSULE | Freq: Three times a day (TID) | ORAL | Status: DC
  Administered 2012-10-08 – 2012-10-09 (×4): 150 mg via ORAL
  Filled 2012-10-08 (×4): qty 2

## 2012-10-08 MED ORDER — HYOSCYAMINE SULFATE 0.125 MG SL SUBL
0.2500 mg | SUBLINGUAL_TABLET | SUBLINGUAL | Status: DC | PRN
  Administered 2012-10-08: 0.25 mg via SUBLINGUAL
  Filled 2012-10-08 (×2): qty 2

## 2012-10-08 MED ORDER — GABAPENTIN 300 MG PO CAPS
600.0000 mg | ORAL_CAPSULE | Freq: Four times a day (QID) | ORAL | Status: DC
  Administered 2012-10-08 – 2012-10-09 (×4): 600 mg via ORAL
  Filled 2012-10-08 (×8): qty 2

## 2012-10-08 MED ORDER — FENTANYL 50 MCG/HR TD PT72
50.0000 ug | MEDICATED_PATCH | TRANSDERMAL | Status: DC
  Administered 2012-10-08: 50 ug via TRANSDERMAL
  Filled 2012-10-08: qty 1

## 2012-10-08 MED ORDER — SODIUM CHLORIDE 0.9 % IV SOLN: Freq: Once | INTRAVENOUS | Status: DC

## 2012-10-08 MED ORDER — ADULT MULTIVITAMIN W/MINERALS CH
1.0000 | ORAL_TABLET | Freq: Every day | ORAL | Status: DC
  Administered 2012-10-08 – 2012-10-09 (×2): 1 via ORAL
  Filled 2012-10-08 (×2): qty 1

## 2012-10-08 MED ORDER — LIDOCAINE HCL 2 % EX GEL
CUTANEOUS | Status: AC
  Filled 2012-10-08: qty 10

## 2012-10-08 MED ORDER — DEXTROSE 5 % IV SOLN
1.0000 g | INTRAVENOUS | Status: DC
  Administered 2012-10-08 (×2): 1 g via INTRAVENOUS
  Filled 2012-10-08 (×3): qty 10

## 2012-10-08 MED ORDER — HYDROMORPHONE HCL PF 1 MG/ML IJ SOLN: 0.5000 mg | INTRAMUSCULAR | Status: DC | PRN

## 2012-10-08 MED ORDER — LEVOTHYROXINE SODIUM 25 MCG PO TABS
25.0000 ug | ORAL_TABLET | Freq: Every day | ORAL | Status: DC
  Administered 2012-10-08 – 2012-10-09 (×2): 25 ug via ORAL
  Filled 2012-10-08 (×3): qty 1

## 2012-10-08 MED ORDER — FENTANYL CITRATE 0.05 MG/ML IJ SOLN
100.0000 ug | Freq: Once | INTRAMUSCULAR | Status: AC
  Administered 2012-10-08: 100 ug via INTRAVENOUS
  Filled 2012-10-08: qty 2

## 2012-10-08 MED ORDER — SODIUM CHLORIDE 0.9 % IV SOLN
INTRAVENOUS | Status: DC
  Administered 2012-10-08 – 2012-10-09 (×4): via INTRAVENOUS

## 2012-10-08 MED ORDER — PANTOPRAZOLE SODIUM 40 MG PO TBEC
40.0000 mg | DELAYED_RELEASE_TABLET | Freq: Every day | ORAL | Status: DC
  Administered 2012-10-08 – 2012-10-09 (×2): 40 mg via ORAL
  Filled 2012-10-08 (×2): qty 1

## 2012-10-08 NOTE — Progress Notes (Signed)
Patient ID: Vincent [name], male   DOB: [birth year], 47 y.o.   MRN: [id number]  Pt sleeping, comfortable.   CBI running normally. Clear to light pink on 3 gtt per second.   Abd - soft, NT , ND

## 2012-10-08 NOTE — H&P (Signed)
H&P   History of Present Illness:  Patient underwent TURBT May 2014 (path [facility]). Presented to [facility] with gross hematuria and office yesterday. Foley placed and irrigated. Came to ER tonight as foley clotted off and wasn't draining. A new foley was placed and they were not able to irrigate. A 3 way foley was placed, CBI was started but fluid ran in and not out. Also catheter would not irrigate. I was called and pt reported as distended. I instructed nurse to remove that catheter. Pt voided only a small amount of grossly bloody urine since then.    Past Medical History  Diagnosis Date  . COPD (chronic obstructive pulmonary disease)   . GERD (gastroesophageal reflux disease)   . Hypercholesteremia   . Neuropathy in diabetes     LOWER EXTREMITIES  . Bladder tumor   . Hypothyroidism   . Hyperlipidemia   . History of TIA (transient ischemic attack) 2008--- NO RESIDUAL  . Diabetes mellitus, type 2   . H/O hiatal hernia   . History of kidney stones   . Skin sore     BILATERAL HANDS AND FOREARMS--  FROM HYDROCODONE ALLERGIC REACTION 1 YR AGO--  SLOW TO HEAL  . Gastric ulcer   . Urgency of urination   . Hematuria   . Cancer     bladder  . Gastroparesis    Past Surgical History  Procedure Laterality Date  . Rotator cuff repair Right 12/2004    right  . Carpal tunnel release Right 09-16-2003  . Laparoscopic cholecystectomy  11-17-2010  . Cardiac catheterization  12-27-2001  DR Jacinto Halim  &  05-26-2009  DR VARANASI    RESULTS FOR BOTH ARE NORMAL CORONARIES AND PERSERVED LVF/ EF 60%  . Transurethral resection of bladder tumor N/A 08/09/2012    Procedure: TRANSURETHRAL RESECTION OF BLADDER TUMOR (TURBT) WITH GYRUS WITH MITOMYCIN C;  Surgeon: Garnett Farm, MD;  Location: [facility];  Service: Urology;  Laterality: N/A;    Home Medications:   (Not in a hospital admission) Allergies:  Allergies  Allergen Reactions  . Hydrocodone-Acetaminophen Rash and Other (See Comments)     Blisters developed on arms  . Celebrex (Celecoxib) Rash  . Sulfa Antibiotics Rash    Family History  Problem Relation Age of Onset  . Diabetes Mother   . Diabetes Father   . Hypertension Father   . Kidney disease Father    Social History:  reports that he has been smoking Cigarettes.  He has a 40 pack-year smoking history. He does not have any smokeless tobacco history on file. He reports that  drinks alcohol. He reports that he does not use illicit drugs.  ROS: A complete review of systems was performed.  All systems are negative except for pertinent findings as noted. @ROS @   Physical Exam:  Vital signs in last 24 hours: Temp:  [98.3 F (36.8 C)] 98.3 F (36.8 C) (07/28 2144) Pulse Rate:  [93-103] 93 (07/29 0005) Resp:  [18-20] 18 (07/29 0005) BP: (111-138)/(83-88) 138/83 mmHg (07/29 0005) SpO2:  [95 %-97 %] 95 % (07/29 0005) General:  Alert and oriented, No acute distress HEENT: Normocephalic, atraumatic Neck: No JVD or lymphadenopathy Cardiovascular: Regular rate and rhythm Lungs: Regular rate and effort Abdomen: Soft, nontender, nondistended, no abdominal masses Back: No CVA tenderness Extremities: No edema Neurologic: Grossly intact  Procedure: Pt was prepped and draped and a new 20Fr coude tip hematuria catheter was placed the balloon inflated and seated at bladder neck.  It irrigated normally and I irrigated in 70 cc aliquots about 500 cc NS until clear. A few small clots were evacuated. He was connected to CBI at about 3 gtt / sec with light red to pink urine. His bladder was very spastic and irrigation was not easy in that regard. On my initial exam above his bladder was soft and not distended, so I suspect the nurse thought his bladder was "distended" because of the bladder spasms. Quantitatively I got out what I put in corroborating an intact bladder.  Laboratory Data:  Results for orders placed during the hospital encounter of 10/07/12 (from the past 24  hour(s))  CBC     Status: Abnormal   Collection Time    10/08/12 12:40 AM      Result Value Range   WBC 10.0  4.0 - 10.5 K/uL   RBC 4.72  4.22 - 5.81 MIL/uL   Hemoglobin 13.8  13.0 - 17.0 g/dL   HCT 40.9 (*) 81.1 - 91.4 %   MCV 81.8  78.0 - 100.0 fL   MCH 29.2  26.0 - 34.0 pg   MCHC 35.8  30.0 - 36.0 g/dL   RDW 78.2  95.6 - 21.3 %   Platelets 230  150 - 400 K/uL   No results found for this or any previous visit (from the past 240 hour(s)). Creatinine: No results found for this basename: CREATININE,  in the last 168 hours  A/P: 1) Gross hematuria - bleeding is ongoing but light. CBI running. Will admit for observation, IVF, abx to cover catheter and irrigation, CBI, NPO. He may need OR tomorrow for cystoscopy and fulguration.   Antony Haste 10/08/2012, 2:49 AM

## 2012-10-08 NOTE — ED Provider Notes (Signed)
CSN: [id number]     Arrival date & time 10/07/12  2134 History     First MD Initiated Contact with Patient 10/07/12 2155     Chief Complaint  Patient presents with  . foley catheter clogged    (Consider location/radiation/quality/duration/timing/severity/associated sxs/prior Treatment) HPI Comments: Pt comes in with cc of hematuria, and clogged foley catheter. Started having hematuria 2 days back, saw urology today, got a foley catheter placed, and was discharged home. Pt states that starting this evening, he started having the clogging problems again. Pt has persistent gross hematuria.  The history is provided by the patient.    Past Medical History  Diagnosis Date  . COPD (chronic obstructive pulmonary disease)   . GERD (gastroesophageal reflux disease)   . Hypercholesteremia   . Neuropathy in diabetes     LOWER EXTREMITIES  . Bladder tumor   . Hypothyroidism   . Hyperlipidemia   . History of TIA (transient ischemic attack) 2008--- NO RESIDUAL  . Diabetes mellitus, type 2   . H/O hiatal hernia   . History of kidney stones   . Skin sore     BILATERAL HANDS AND FOREARMS--  FROM HYDROCODONE ALLERGIC REACTION 1 YR AGO--  SLOW TO HEAL  . Gastric ulcer   . Urgency of urination   . Hematuria   . Cancer     bladder  . Gastroparesis    Past Surgical History  Procedure Laterality Date  . Rotator cuff repair Right 12/2004    right  . Carpal tunnel release Right 09-16-2003  . Laparoscopic cholecystectomy  11-17-2010  . Cardiac catheterization  12-27-2001  DR Jacinto Halim  &  05-26-2009  DR VARANASI    RESULTS FOR BOTH ARE NORMAL CORONARIES AND PERSERVED LVF/ EF 60%  . Transurethral resection of bladder tumor N/A 08/09/2012    Procedure: TRANSURETHRAL RESECTION OF BLADDER TUMOR (TURBT) WITH GYRUS WITH MITOMYCIN C;  Surgeon: Garnett Farm, MD;  Location: [facility];  Service: Urology;  Laterality: N/A;   Family History  Problem Relation Age of Onset  . Diabetes  Mother   . Diabetes Father   . Hypertension Father   . Kidney disease Father    History  Substance Use Topics  . Smoking status: Current Every Day Smoker -- 2.00 packs/day for 20 years    Types: Cigarettes  . Smokeless tobacco: Not on file  . Alcohol Use: Yes     Comment: RARE    Review of Systems  Constitutional: Negative for activity change and appetite change.  Respiratory: Negative for cough and shortness of breath.   Cardiovascular: Negative for chest pain.  Gastrointestinal: Negative for abdominal pain.  Genitourinary: Positive for hematuria. Negative for dysuria.    Allergies  Hydrocodone-acetaminophen; Celebrex; and Sulfa antibiotics  Home Medications   Current Outpatient Rx  Name  Route  Sig  Dispense  Refill  . atorvastatin (LIPITOR) 40 MG tablet   Oral   Take 40 mg by mouth every morning.          . cephALEXin (KEFLEX) 500 MG capsule   Oral   Take 1 capsule (500 mg total) by mouth 4 (four) times daily.   40 capsule   0   . cholecalciferol (VITAMIN D) 1000 UNITS tablet   Oral   Take 1,000 Units by mouth every morning.          . Coenzyme Q10 (CO Q 10) 10 MG CAPS   Oral   Take 1 tablet by mouth  daily.          . esomeprazole (NEXIUM) 20 MG capsule   Oral   Take 20 mg by mouth daily before breakfast.         . fentaNYL (DURAGESIC - DOSED MCG/HR) 50 MCG/HR   Transdermal   Place 1 patch onto the skin every other day.         . fish oil-omega-3 fatty acids 1000 MG capsule   Oral   Take 1,000-2,000 mg by mouth 2 (two) times daily. Take 1 capsule in the morning and 2 capsules in the evening         . FLUoxetine (PROZAC) 40 MG capsule   Oral   Take 40 mg by mouth daily.         Marland Kitchen gabapentin (NEURONTIN) 600 MG tablet   Oral   Take 600 mg by mouth 4 (four) times daily.         Marland Kitchen HYDROmorphone (DILAUDID) 2 MG tablet   Oral   Take 2 mg by mouth every 4 (four) hours as needed for pain.         Marland Kitchen insulin lispro protamine-insulin  lispro (HUMALOG 75/25) (75-25) 100 UNIT/ML SUSP   Subcutaneous   Inject 20-60 Units into the skin 3 (three) times daily before meals. Take 60 units in the morning and with dinner and 20 units at lunch         . levothyroxine (SYNTHROID, LEVOTHROID) 25 MCG tablet   Oral   Take 25 mcg by mouth daily before breakfast.          . Liraglutide (VICTOZA) 18 MG/3ML SOLN injection   Subcutaneous   Inject 1.2 mg into the skin every morning.          . metFORMIN (GLUCOPHAGE) 1000 MG tablet   Oral   Take 1,000 mg by mouth 2 (two) times daily with a meal.         . metoCLOPramide (REGLAN) 5 MG tablet   Oral   Take 5 mg by mouth 4 (four) times daily.         . mupirocin ointment (BACTROBAN) 2 %   Topical   Apply topically 3 (three) times daily.   22 g   0   . pregabalin (LYRICA) 150 MG capsule   Oral   Take 150 mg by mouth 3 (three) times daily.         . vitamin B-12 (CYANOCOBALAMIN) 1000 MCG tablet   Oral   Take 1,000 mcg by mouth every morning.           BP 138/83  Pulse 93  Temp(Src) 98.3 F (36.8 C) (Oral)  Resp 18  SpO2 95% Physical Exam  Nursing note and vitals reviewed. Constitutional: He is oriented to person, place, and time. He appears well-developed.  HENT:  Head: Normocephalic and atraumatic.  Eyes: Conjunctivae and EOM are normal. Pupils are equal, round, and reactive to light.  Neck: Normal range of motion. Neck supple.  Cardiovascular: Normal rate and regular rhythm.   Pulmonary/Chest: Effort normal and breath sounds normal.  Abdominal: Soft. Bowel sounds are normal. He exhibits no distension. There is tenderness. There is no rebound and no guarding.  Neurological: He is alert and oriented to person, place, and time.  Skin: Skin is warm.    ED Course   Procedures (including critical care time)  Labs Reviewed  CBC   No results found. 1. Hematuria   2. Obstructed Foley catheter, initial encounter  MDM  Pt has gross hematuria and  clogged catheter from the clots. We irrigated the 2 way foley that he came in with - and he continues to have large clots coming out. We will now place a foley catheter that will allow for continuous irrigation. CBC ordered. Pt will be signed out to Dr. Read Drivers. Plan will be still discharge oce irrigation improved.  Derwood Kaplan, MD 10/08/12 [phone]

## 2012-10-08 NOTE — Progress Notes (Signed)
INITIAL NUTRITION ASSESSMENT  DOCUMENTATION CODES Per approved criteria  -Severe malnutrition in the context of chronic illness  Pt meets criteria for SEVERE MALNUTRITION in the context of Chronic illness as evidenced by 11% wt loss in less than 5 months and estimated energy intake <75% of estimated needs for >1 month.   INTERVENTION: Diet advancement per MD discretion Provide Multivitamin with minerals daily Add Glucerna TID when diet advanced  NUTRITION DIAGNOSIS: Inadequate oral intake related to gastroparesis/poor appetite as evidenced by 11% wt loss in less than 5 months.   Goal: Pt to meet >/= 90% of their estimated nutrition needs  Monitor:  Diet advancement/PO intake Weight Labs  Reason for Assessment: Malnutrition Screening Tool, score of 5  47 y.o. male  Admitting Dx: Hematuria  ASSESSMENT: 47 year old male who underwent TURBT May 2014 (path [facility]). Presented to [facility] with gross hematuria and office yesterday. Foley placed and irrigated. Came to ER tonight as foley clotted off and wasn't draining. A new foley was placed and they were not able to irrigate. Pt has history of COPD, GERD, HLD, T2DM, and gastroparesis.  Pt reports that he weighs 235 lbs back in December at which time he was diagnosed with gastroparesis. Pt states that he has no desire to eat and that due to gastroparesis only eats one meal per day due to staying full for a long time. Pt reports that he started drinking Ensure supplements twice daily and has been able to maintain his weight around 180 lbs for the past couple weeks. Discussed diet tips for gastroparesis but, pt reports he already does these things.    Height: Ht Readings from Last 1 Encounters:  10/08/12 6' (1.829 m)    Weight: Wt Readings from Last 1 Encounters:  10/08/12 183 lb 3.2 oz (83.1 kg)    Ideal Body Weight: 178 lbs  % Ideal Body Weight: 103 %  Wt Readings from Last 10 Encounters:  10/08/12 183 lb 3.2 oz (83.1 kg)   09/18/12 190 lb (86.183 kg)  08/09/12 189 lb (85.73 kg)  08/09/12 189 lb (85.73 kg)  05/26/12 206 lb (93.441 kg)  11/02/10 212 lb (96.163 kg)    Usual Body Weight: 235 lbs per pt  % Usual Body Weight: 78%  BMI:  Body mass index is 24.84 kg/(m^2).  Estimated Nutritional Needs: Kcal: 2100-2300 Protein: >/= 124 grams Fluid: 2.4 L  Skin: WDL  Diet Order: NPO  EDUCATION NEEDS: -No education needs identified at this time   Intake/Output Summary (Last 24 hours) at 10/08/12 1040 Last data filed at 10/08/12 0700  Gross per 24 hour  Intake   6940 ml  Output   4150 ml  Net   2790 ml    Last BM: 7/27   Labs:   Recent Labs Lab 10/08/12 0040  NA 136  K 4.1  CL 98  CO2 30  BUN 16  CREATININE 0.75  CALCIUM 9.3  GLUCOSE 318*    CBG (last 3)  No results found for this basename: GLUCAP,  in the last 72 hours  Scheduled Meds: . atorvastatin  40 mg Oral q morning - 10a  . cefTRIAXone (ROCEPHIN)  IV  1 g Intravenous Q24H  . fentaNYL  50 mcg Transdermal Q48H  . FLUoxetine  40 mg Oral Daily  . gabapentin  600 mg Oral QID  . levothyroxine  25 mcg Oral QAC breakfast  . lidocaine      . pantoprazole  40 mg Oral Daily  .  pregabalin  150 mg Oral TID    Continuous Infusions: . sodium chloride 125 mL/hr at 10/08/12 0510  . sodium chloride irrigation      Past Medical History  Diagnosis Date  . COPD (chronic obstructive pulmonary disease)   . GERD (gastroesophageal reflux disease)   . Hypercholesteremia   . Neuropathy in diabetes     LOWER EXTREMITIES  . Bladder tumor   . Hypothyroidism   . Hyperlipidemia   . History of TIA (transient ischemic attack) 2008--- NO RESIDUAL  . Diabetes mellitus, type 2   . H/O hiatal hernia   . History of kidney stones   . Skin sore     BILATERAL HANDS AND FOREARMS--  FROM HYDROCODONE ALLERGIC REACTION 1 YR AGO--  SLOW TO HEAL  . Gastric ulcer   . Urgency of urination   . Hematuria   . Cancer     bladder  . Gastroparesis      Past Surgical History  Procedure Laterality Date  . Rotator cuff repair Right 12/2004    right  . Carpal tunnel release Right 09-16-2003  . Laparoscopic cholecystectomy  11-17-2010  . Cardiac catheterization  12-27-2001  DR Jacinto Halim  &  05-26-2009  DR VARANASI    RESULTS FOR BOTH ARE NORMAL CORONARIES AND PERSERVED LVF/ EF 60%  . Transurethral resection of bladder tumor N/A 08/09/2012    Procedure: TRANSURETHRAL RESECTION OF BLADDER TUMOR (TURBT) WITH GYRUS WITH MITOMYCIN C;  Surgeon: Garnett Farm, MD;  Location: [facility];  Service: Urology;  Laterality: N/A;    Ian Malkin RD, LDN Inpatient Clinical Dietitian Pager: [phone] After Hours Pager: [phone]

## 2012-10-08 NOTE — ED Provider Notes (Signed)
The patient's continuous bladder duration catheter in his car off and has not responded to attempts to dislodge clot. Dr. Mena Goes of urology has been consulted and will see the patient in the ED.  Hanley Seamen, MD 10/08/12 0157

## 2012-10-08 NOTE — ED Notes (Signed)
Patient complaining of bladder distention and no drainage from catheter. The patient catheter flushed and still is dark red with clots after 1000 cc's. Patient uncomfortable. EDP aware orders received and carried out.

## 2012-10-08 NOTE — Progress Notes (Signed)
Inpatient Diabetes Program Recommendations  AACE/ADA: New Consensus Statement on Inpatient Glycemic Control (2013)  Target Ranges:  Prepandial:   less than 140 mg/dL      Peak postprandial:   less than 180 mg/dL (1-2 hours)      Critically ill patients:  140 - 180 mg/dL   Lab glucose =454 Please order CBG monitoring. Patient has a history of Type-2 DM and takes insulin at home.   Please address insulin needs during hospitalization. If patient is not eating well, recommend using Lantus and Novolog instead of premixed insulin. Thank you  Piedad Climes BSN, RN,CDE Inpatient Diabetes Coordinator [phone] (team pager)

## 2012-10-08 NOTE — Progress Notes (Signed)
Patient ID: Vincent [name], male   DOB: 12/21/1965, 47 y.o.   MRN: [id number] The patient is without complaint of suprapubic pain or pressure.  His 20 French three-way Foley catheter is draining and on CBI. I irrigated the bladder and noted no clots. The CBI remains slightly pink.  I'm going to keep him n.p.o. for now. I find it hard to believe this far out from his TURBT that this is arterial bleeding although obviously that is possible. He's not on anything that could be causing anticoagulation but I will check a PT and PTT.  1. Continue n.p.o. 2. Continue CBI. 3. Check PT and PTT.

## 2012-10-09 ENCOUNTER — Other Ambulatory Visit: Payer: Self-pay | Admitting: Urology

## 2012-10-09 ENCOUNTER — Encounter (HOSPITAL_BASED_OUTPATIENT_CLINIC_OR_DEPARTMENT_OTHER): Payer: Self-pay | Admitting: *Deleted

## 2012-10-09 DIAGNOSIS — E43 Unspecified severe protein-calorie malnutrition: Secondary | ICD-10-CM | POA: Insufficient documentation

## 2012-10-09 NOTE — Progress Notes (Signed)
NPO AFTER MN. ARRIVES AT 0900. NEEDS ISTAT AND EKG. WILL TAKE GABAPENTIN, NEXIUM, SYNTHROID, LYRICA, AND LIPITOR AM OF SURG W/ SIPS OF WATER.

## 2012-10-09 NOTE — Progress Notes (Signed)
Pt's foley catheter has been removed. Pt encouraged to drunk fluids. Instructed to void in urinal. Will pass on to morning RN.

## 2012-10-09 NOTE — Progress Notes (Signed)
  Subjective:  The patient reports no suprapubic discomfort. I stopped his CBI yesterday and he reports he is not having any clots but was still having some hematuria. He does have gastroparesis and is being evaluated for that currently at [facility]. He has a swallowing study already scheduled there. He was taking Reglan but reports that this was not resulted in any significant subjective improvement.  Objective: Vital signs in last 24 hours: Temp:  [98 F (36.7 C)-98.5 F (36.9 C)] 98.5 F (36.9 C) (07/30 0512) Pulse Rate:  [86-95] 86 (07/30 0512) Resp:  [18-19] 18 (07/30 0512) BP: (105-131)/(58-72) 105/58 mmHg (07/30 0512) SpO2:  [95 %-97 %] 96 % (07/30 0512)  Intake/Output from previous day: 07/29 0701 - 07/30 0700 In: 3655 [P.O.:480; I.V.:2125; IV Piggyback:50] Out: 3350 [Urine:3350] Intake/Output this shift: Total I/O In: 675 [I.V.:625; IV Piggyback:50] Out: 1650 [Urine:1650]  Physical Exam:  He was alert and oriented. He denies any flank or suprapubic pain.  Lab Results:  Recent Labs  10/08/12 0040  HGB 13.8  HCT 38.6*   BMET  Recent Labs  10/08/12 0040  NA 136  K 4.1  CL 98  CO2 30  GLUCOSE 318*  BUN 16  CREATININE 0.75  CALCIUM 9.3    Recent Labs  10/08/12 0753  INR 0.90   No results found for this basename: LABURIN,  in the last 72 hours Results for orders placed during the hospital encounter of 11/09/10  SURGICAL PCR SCREEN     Status: Abnormal   Collection Time    11/09/10  2:31 PM      Result Value Range Status   MRSA, PCR NEGATIVE  NEGATIVE Final   Staphylococcus aureus POSITIVE (*) NEGATIVE Final   Comment:            The Xpert SA Assay (FDA     approved for NASAL specimens     only), is one component of     a comprehensive surveillance     program.  It is not intended     to diagnose infection nor to     guide or monitor treatment.    Studies/Results: No results found.  Assessment/Plan: His urine is still bloody but  he has no clots. I check coags and there completely normal. He would like to get out of the hospital but I first want to remove the catheter and have him get up and move around to make sure he is not having worsening of his bleeding and clot formation. He is going to force fluids and I will maintain his IV running at 125 cc per hour as well.  DC Foley catheter.  2. Ambulate.  3. Force fluids.  4. I will reevaluate later this morning for possible discharge.   LOS: 2 days   Shallon Yaklin C 10/09/2012, 6:53 AM

## 2012-10-09 NOTE — Discharge Summary (Signed)
Physician Discharge Summary  Patient ID: Vincent [name] MRN: [id number] DOB/AGE: 1966-01-08 47 y.o.  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 He did well for several weeks after his surgery but then developed gross hematuria. He was voiding without difficulty and this was managed as an outpatient but then develop obstruction due to clots and had a Foley catheter placed in the office. He was irrigated free of clots and sent home with a Foley catheter but this became occluded. He presented to the emergency room and was admitted. He remained on CBI through a three-way catheter overnight and by the next day his urine appeared to be clearing with no clots so his CBI was stopped. He did well over the ensuing 24 hours without obstruction of his catheter but was still having some hematuria. I removed his catheter and he continued to void spontaneously with a few small clots only. I discussed with the patient my concern that he was continuing to have some bleeding and therefore my recommendation was that he either have a Foley catheter reinserted or, because he was quite adamant at not wanting to have another catheter, to leave the catheter out but observe him in the hospital 1 more day to be sure he did not redevelop clot retention. 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 What I suggested was that he stay overnight and allow me to perform cystoscopy and fulguration of any bleeding points identified. He agreed to allow me  to perform the procedure but was added that he go home and come back to have that done as an outpatient tomorrow. I told him he would need to keep his activity level to a bare minimum and forces much fluid as possible. I have scheduled him for surgery at 10:30 tomorrow.  Consults: None  Significant Diagnostic Studies: No results found.  Treatments: continuous bladder irrigation and Foley catheterization  Discharge Exam: Blood pressure 105/58, pulse 86, temperature 98.5 F (36.9 C), temperature source Oral, resp. rate 18, height 6' (1.829 m), weight 83.1 kg (183 lb 3.2 oz), SpO2 96.00%. General appearance: alert, cooperative and no distress His abdomen was soft he was noted to have no suprapubic tenderness or mass.  Disposition: 01-Home or Self Care  Discharge Orders   Future Orders Complete By Expires     Discharge patient  As directed         Medication List    STOP taking these medications       cephALEXin 500 MG capsule  Commonly known as:  KEFLEX      TAKE these medications       atorvastatin 40 MG tablet  Commonly known as:  LIPITOR  Take 40 mg by mouth every morning.     cholecalciferol 1000 UNITS tablet  Commonly known as:  VITAMIN D  Take 1,000 Units by mouth every morning.     Co Q 10 10 MG Caps  Take 1 tablet by mouth daily.     esomeprazole 20 MG capsule  Commonly known as:  NEXIUM  Take 20 mg by mouth daily  before breakfast.     fentaNYL 50 MCG/HR  Commonly known as:  DURAGESIC - dosed mcg/hr  Place 1 patch onto the skin every other day.     fish oil-omega-3 fatty acids 1000 MG capsule  Take 1,000-2,000 mg by mouth 2 (two) times daily. Take 1 capsule in the morning and 2 capsules in the evening     FLUoxetine 40 MG capsule  Commonly known as:  PROZAC  Take 40 mg by mouth daily.     gabapentin 600 MG tablet  Commonly known as:  NEURONTIN  Take 600 mg by mouth 4 (four) times daily.     HYDROmorphone 2 MG tablet  Commonly known as:  DILAUDID   Take 2 mg by mouth every 4 (four) hours as needed for pain.     insulin lispro protamine-lispro (75-25) 100 UNIT/ML Susp  Commonly known as:  HUMALOG 75/25  Inject 20-60 Units into the skin 3 (three) times daily before meals. Take 60 units in the morning and with dinner and 20 units at lunch     levothyroxine 25 MCG tablet  Commonly known as:  SYNTHROID, LEVOTHROID  Take 25 mcg by mouth daily before breakfast.     metFORMIN 1000 MG tablet  Commonly known as:  GLUCOPHAGE  Take 1,000 mg by mouth 2 (two) times daily with a meal.     metoCLOPramide 5 MG tablet  Commonly known as:  REGLAN  Take 5 mg by mouth 4 (four) times daily.     mupirocin ointment 2 %  Commonly known as:  BACTROBAN  Apply topically 3 (three) times daily.     pregabalin 150 MG capsule  Commonly known as:  LYRICA  Take 150 mg by mouth 3 (three) times daily.     VICTOZA 18 MG/3ML Soln injection  Generic drug:  Liraglutide  Inject 1.2 mg into the skin every morning.     vitamin B-12 1000 MCG tablet  Commonly known as:  CYANOCOBALAMIN  Take 1,000 mcg by mouth every morning.           Follow-up Information   Please follow up.   Contact information:   Urology as planned      Signed: Garnett Farm 10/09/2012, 1:08 PM  he

## 2012-10-09 NOTE — Progress Notes (Signed)
Assumed care of pt at 1100. Agree with this am's assessment. Will continue to monitor. Bleu Moisan, Lavone Orn, RN

## 2012-10-10 ENCOUNTER — Encounter (HOSPITAL_BASED_OUTPATIENT_CLINIC_OR_DEPARTMENT_OTHER): Payer: Self-pay | Admitting: Certified Registered"

## 2012-10-10 ENCOUNTER — Ambulatory Visit (HOSPITAL_BASED_OUTPATIENT_CLINIC_OR_DEPARTMENT_OTHER)
Admission: RE | Admit: 2012-10-10 | Discharge: 2012-10-10 | Disposition: A | Discharge status: Home / Self Care | Payer: 59 | Source: Ambulatory Visit | Attending: Urology | Admitting: Urology

## 2012-10-10 ENCOUNTER — Encounter (HOSPITAL_BASED_OUTPATIENT_CLINIC_OR_DEPARTMENT_OTHER): Payer: Self-pay

## 2012-10-10 ENCOUNTER — Ambulatory Visit (HOSPITAL_BASED_OUTPATIENT_CLINIC_OR_DEPARTMENT_OTHER): Payer: 59 | Admitting: Certified Registered"

## 2012-10-10 ENCOUNTER — Encounter (HOSPITAL_BASED_OUTPATIENT_CLINIC_OR_DEPARTMENT_OTHER)
Admission: RE | Disposition: A | Discharge status: Home / Self Care | Payer: Self-pay | Source: Ambulatory Visit | Attending: Urology

## 2012-10-10 ENCOUNTER — Other Ambulatory Visit: Payer: Self-pay

## 2012-10-10 DIAGNOSIS — IMO0002 Reserved for concepts with insufficient information to code with codable children: Secondary | ICD-10-CM | POA: Insufficient documentation

## 2012-10-10 DIAGNOSIS — C679 Malignant neoplasm of bladder, unspecified: Secondary | ICD-10-CM | POA: Insufficient documentation

## 2012-10-10 DIAGNOSIS — J449 Chronic obstructive pulmonary disease, unspecified: Secondary | ICD-10-CM | POA: Insufficient documentation

## 2012-10-10 DIAGNOSIS — Z885 Allergy status to narcotic agent status: Secondary | ICD-10-CM | POA: Insufficient documentation

## 2012-10-10 DIAGNOSIS — J4489 Other specified chronic obstructive pulmonary disease: Secondary | ICD-10-CM | POA: Insufficient documentation

## 2012-10-10 DIAGNOSIS — Z79899 Other long term (current) drug therapy: Secondary | ICD-10-CM | POA: Insufficient documentation

## 2012-10-10 DIAGNOSIS — K219 Gastro-esophageal reflux disease without esophagitis: Secondary | ICD-10-CM | POA: Insufficient documentation

## 2012-10-10 DIAGNOSIS — Y838 Other surgical procedures as the cause of abnormal reaction of the patient, or of later complication, without mention of misadventure at the time of the procedure: Secondary | ICD-10-CM | POA: Insufficient documentation

## 2012-10-10 DIAGNOSIS — E039 Hypothyroidism, unspecified: Secondary | ICD-10-CM | POA: Insufficient documentation

## 2012-10-10 DIAGNOSIS — E785 Hyperlipidemia, unspecified: Secondary | ICD-10-CM | POA: Insufficient documentation

## 2012-10-10 DIAGNOSIS — F17200 Nicotine dependence, unspecified, uncomplicated: Secondary | ICD-10-CM | POA: Insufficient documentation

## 2012-10-10 DIAGNOSIS — Z882 Allergy status to sulfonamides status: Secondary | ICD-10-CM | POA: Insufficient documentation

## 2012-10-10 DIAGNOSIS — R310 Gross hematuria: Secondary | ICD-10-CM

## 2012-10-10 DIAGNOSIS — Z888 Allergy status to other drugs, medicaments and biological substances status: Secondary | ICD-10-CM | POA: Insufficient documentation

## 2012-10-10 DIAGNOSIS — Y929 Unspecified place or not applicable: Secondary | ICD-10-CM | POA: Insufficient documentation

## 2012-10-10 DIAGNOSIS — K3184 Gastroparesis: Secondary | ICD-10-CM | POA: Insufficient documentation

## 2012-10-10 DIAGNOSIS — E1142 Type 2 diabetes mellitus with diabetic polyneuropathy: Secondary | ICD-10-CM | POA: Insufficient documentation

## 2012-10-10 DIAGNOSIS — Z8673 Personal history of transient ischemic attack (TIA), and cerebral infarction without residual deficits: Secondary | ICD-10-CM | POA: Insufficient documentation

## 2012-10-10 DIAGNOSIS — N3289 Other specified disorders of bladder: Secondary | ICD-10-CM | POA: Insufficient documentation

## 2012-10-10 DIAGNOSIS — E1149 Type 2 diabetes mellitus with other diabetic neurological complication: Secondary | ICD-10-CM | POA: Insufficient documentation

## 2012-10-10 DIAGNOSIS — Z794 Long term (current) use of insulin: Secondary | ICD-10-CM | POA: Insufficient documentation

## 2012-10-10 HISTORY — DX: Personal history of peptic ulcer disease: Z87.11

## 2012-10-10 HISTORY — PX: CYSTOSCOPY: SHX5120

## 2012-10-10 HISTORY — DX: Personal history of other diseases of the digestive system: Z87.19

## 2012-10-10 HISTORY — DX: Malignant neoplasm of bladder, unspecified: C67.9

## 2012-10-10 LAB — POCT I-STAT 4, (NA,K, GLUC, HGB,HCT)
Glucose, Bld: 360 mg/dL — ABNORMAL HIGH (ref 70–99)
HCT: 31 % — ABNORMAL LOW (ref 39.0–52.0)
Hemoglobin: 10.5 g/dL — ABNORMAL LOW (ref 13.0–17.0)
Potassium: 3.8 mEq/L (ref 3.5–5.1)
Sodium: 139 mEq/L (ref 135–145)

## 2012-10-10 LAB — GLUCOSE, CAPILLARY: Glucose-Capillary: 296 mg/dL — ABNORMAL HIGH (ref 70–99)

## 2012-10-10 SURGERY — CYSTOSCOPY
Anesthesia: General | Site: Bladder | Wound class: Clean Contaminated

## 2012-10-10 MED ORDER — FENTANYL CITRATE 0.05 MG/ML IJ SOLN
25.0000 ug | INTRAMUSCULAR | Status: DC | PRN
  Filled 2012-10-10: qty 1

## 2012-10-10 MED ORDER — ONDANSETRON HCL 4 MG/2ML IJ SOLN
INTRAMUSCULAR | Status: DC | PRN
  Administered 2012-10-10: 4 mg via INTRAVENOUS

## 2012-10-10 MED ORDER — DEXAMETHASONE SODIUM PHOSPHATE 4 MG/ML IJ SOLN
INTRAMUSCULAR | Status: DC | PRN
  Administered 2012-10-10: 8 mg via INTRAVENOUS

## 2012-10-10 MED ORDER — LACTATED RINGERS IV SOLN
INTRAVENOUS | Status: DC
  Administered 2012-10-10: 10:00:00 via INTRAVENOUS
  Filled 2012-10-10: qty 1000

## 2012-10-10 MED ORDER — FENTANYL CITRATE 0.05 MG/ML IJ SOLN
INTRAMUSCULAR | Status: DC | PRN
  Administered 2012-10-10: 50 ug via INTRAVENOUS
  Administered 2012-10-10 (×2): 25 ug via INTRAVENOUS

## 2012-10-10 MED ORDER — MIDAZOLAM HCL 5 MG/5ML IJ SOLN
INTRAMUSCULAR | Status: DC | PRN
  Administered 2012-10-10: 2 mg via INTRAVENOUS

## 2012-10-10 MED ORDER — STERILE WATER FOR IRRIGATION IR SOLN
Status: DC | PRN
  Administered 2012-10-10: 3000 mL

## 2012-10-10 MED ORDER — LACTATED RINGERS IV SOLN
INTRAVENOUS | Status: DC
  Filled 2012-10-10: qty 1000

## 2012-10-10 MED ORDER — INSULIN ASPART 100 UNIT/ML ~~LOC~~ SOLN
0.0000 [IU] | SUBCUTANEOUS | Status: DC
  Administered 2012-10-10: 15 [IU] via SUBCUTANEOUS
  Filled 2012-10-10: qty 0.15

## 2012-10-10 MED ORDER — LIDOCAINE HCL (CARDIAC) 20 MG/ML IV SOLN
INTRAVENOUS | Status: DC | PRN
  Administered 2012-10-10: 60 mg via INTRAVENOUS

## 2012-10-10 MED ORDER — PROPOFOL 10 MG/ML IV BOLUS
INTRAVENOUS | Status: DC | PRN
  Administered 2012-10-10: 180 mg via INTRAVENOUS

## 2012-10-10 MED ORDER — CIPROFLOXACIN IN D5W 200 MG/100ML IV SOLN
200.0000 mg | INTRAVENOUS | Status: AC
  Administered 2012-10-10: 200 mg via INTRAVENOUS
  Filled 2012-10-10: qty 100

## 2012-10-10 SURGICAL SUPPLY — 26 items
ADAPTER CATH URET PLST 4-6FR (CATHETERS) IMPLANT
ADPR CATH URET STRL DISP 4-6FR (CATHETERS)
BAG DRAIN URO-CYSTO SKYTR STRL (DRAIN) ×2 IMPLANT
BAG DRN UROCATH (DRAIN) ×1
CANISTER SUCT LVC 12 LTR MEDI- (MISCELLANEOUS) ×1 IMPLANT
CATH INTERMIT  6FR 70CM (CATHETERS) IMPLANT
CATH URET 5FR 28IN CONE TIP (BALLOONS)
CATH URET 5FR 28IN OPEN ENDED (CATHETERS) IMPLANT
CATH URET 5FR 70CM CONE TIP (BALLOONS) IMPLANT
CLOTH BEACON ORANGE TIMEOUT ST (SAFETY) ×2 IMPLANT
DRAPE CAMERA CLOSED 9X96 (DRAPES) ×2 IMPLANT
ELECT REM PT RETURN 9FT ADLT (ELECTROSURGICAL)
ELECTRODE REM PT RTRN 9FT ADLT (ELECTROSURGICAL) IMPLANT
EVACUATOR MICROVAS BLADDER (UROLOGICAL SUPPLIES) ×1 IMPLANT
GLOVE BIO SURGEON STRL SZ 6 (GLOVE) ×1 IMPLANT
GLOVE BIO SURGEON STRL SZ 6.5 (GLOVE) ×1 IMPLANT
GLOVE BIO SURGEON STRL SZ8 (GLOVE) ×2 IMPLANT
GOWN STRL NON-REIN LRG LVL3 (GOWN DISPOSABLE) ×1 IMPLANT
GOWN STRL REIN XL XLG (GOWN DISPOSABLE) ×2 IMPLANT
GUIDEWIRE 0.038 PTFE COATED (WIRE) ×2 IMPLANT
GUIDEWIRE ANG ZIPWIRE 038X150 (WIRE) IMPLANT
GUIDEWIRE STR DUAL SENSOR (WIRE) IMPLANT
IV NS IRRIG 3000ML ARTHROMATIC (IV SOLUTION) IMPLANT
PACK CYSTOSCOPY (CUSTOM PROCEDURE TRAY) ×2 IMPLANT
SYRINGE IRR TOOMEY STRL 70CC (SYRINGE) IMPLANT
WATER STERILE IRR 3000ML UROMA (IV SOLUTION) ×2 IMPLANT

## 2012-10-10 NOTE — Anesthesia Postprocedure Evaluation (Signed)
  Anesthesia Post-op Note  Patient: Vincent [name]  Procedure(s) Performed: Procedure(s) (LRB): CYSTOSCOPY CLOT EVACUATION FULGERATION OF BLEEDERS  (N/A)  Patient Location: PACU  Anesthesia Type: General  Level of Consciousness: awake and alert   Airway and Oxygen Therapy: Patient Spontanous Breathing  Post-op Pain: mild  Post-op Assessment: Post-op Vital signs reviewed, Patient's Cardiovascular Status Stable, Respiratory Function Stable, Patent Airway and No signs of Nausea or vomiting  Last Vitals:  Filed Vitals:   10/10/12 1100  BP: 101/64  Pulse: 72  Temp:   Resp: 12    Post-op Vital Signs: stable   Complications: No apparent anesthesia complications

## 2012-10-10 NOTE — Op Note (Signed)
PATIENT:  Vincent [name]  PRE-OPERATIVE DIAGNOSIS: Persistent gross hematuria  POST-OPERATIVE DIAGNOSIS: Same  PROCEDURE: Cystoscopy with clot evacuation and fulguration  SURGEON:  Garnett Farm  INDICATION: Vincent [name] is a 47 year old male who underwent TURBT a month ago and then developed gross hematuria. This persisted despite multiple attempts it bladder irrigation and a recent admission with continuous bladder irrigation. His coagulation studies were found to be normal. He continues to bleed and therefore is brought to the operating room for clot activation and fulguration of bleeding points.  ANESTHESIA:  General  EBL:  Minimal  DRAINS: None  LOCAL MEDICATIONS USED:  None  SPECIMEN:  None  Description of procedure: After informed consent the patient was brought to the major or, placed on the table and administered general anesthesia. He was then moved to the dorsal lithotomy position and his genitalia was sterilely prepped and draped. An official timeout was then performed.  A 22 French cystoscope with 12 lens was then passed under direct vision down the urethra which is noted be normal, through the prostatic urethra which was also found to be normal and free of any evidence of bleeding and into the bladder. The bladder was full bloody urine and some clots. These were irrigated out.  Reinspection of the bladder revealed the normal amount of irritation on the posterior wall secondary to the catheters that he has had in recently. I saw no worrisome papillary tumors or other worrisome findings. On the posterior superior wall just to the right of midline was a single point that was draining bright red blood. It was not pumping. I used the Bugbee electrode to fulgurate this. The bladder was then fully and systematically inspected and no further bleeding points were identified. I therefore drained the bladder, removed the cystoscope and the patient was awakened and taken to the  recovery room in stable and satisfactory condition. He tolerated the procedure well no intraoperative complications.  PLAN OF CARE: Discharge to home after PACU  PATIENT DISPOSITION:  PACU - hemodynamically stable.

## 2012-10-10 NOTE — H&P (View-Only) (Signed)
H&P   History of Present Illness:  Patient underwent TURBT May 2014 (path [facility]GTa). Presented to [facility] with gross hematuria and office yesterday. Foley placed and irrigated. Came to ER tonight as foley clotted off and wasn't draining. A new foley was placed and they were not able to irrigate. A 3 way foley was placed, CBI was started but fluid ran in and not out. Also catheter would not irrigate. I was called and pt reported as distended. I instructed nurse to remove that catheter. Pt voided only a small amount of grossly bloody urine since then.    Past Medical History  Diagnosis Date  . COPD (chronic obstructive pulmonary disease)   . GERD (gastroesophageal reflux disease)   . Hypercholesteremia   . Neuropathy in diabetes     LOWER EXTREMITIES  . Bladder tumor   . Hypothyroidism   . Hyperlipidemia   . History of TIA (transient ischemic attack) 2008--- NO RESIDUAL  . Diabetes mellitus, type 2   . H/O hiatal hernia   . History of kidney stones   . Skin sore     BILATERAL HANDS AND FOREARMS--  FROM HYDROCODONE ALLERGIC REACTION 1 YR AGO--  SLOW TO HEAL  . Gastric ulcer   . Urgency of urination   . Hematuria   . Cancer     bladder  . Gastroparesis    Past Surgical History  Procedure Laterality Date  . Rotator cuff repair Right 12/2004    right  . Carpal tunnel release Right 09-16-2003  . Laparoscopic cholecystectomy  11-17-2010  . Cardiac catheterization  12-27-2001  DR GANJI  &  05-26-2009  DR VARANASI    RESULTS FOR BOTH ARE NORMAL CORONARIES AND PERSERVED LVF/ EF 60%  . Transurethral resection of bladder tumor N/A 08/09/2012    Procedure: TRANSURETHRAL RESECTION OF BLADDER TUMOR (TURBT) WITH GYRUS WITH MITOMYCIN C;  Surgeon: Mark C Ottelin, MD;  Location: [facility];  Service: Urology;  Laterality: N/A;    Home Medications:   (Not in a hospital admission) Allergies:  Allergies  Allergen Reactions  . Hydrocodone-Acetaminophen Rash and Other (See Comments)     Blisters developed on arms  . Celebrex (Celecoxib) Rash  . Sulfa Antibiotics Rash    Family History  Problem Relation Age of Onset  . Diabetes Mother   . Diabetes Father   . Hypertension Father   . Kidney disease Father    Social History:  reports that he has been smoking Cigarettes.  He has a 40 pack-year smoking history. He does not have any smokeless tobacco history on file. He reports that  drinks alcohol. He reports that he does not use illicit drugs.  ROS: A complete review of systems was performed.  All systems are negative except for pertinent findings as noted. @ROS@   Physical Exam:  Vital signs in last 24 hours: Temp:  [98.3 F (36.8 C)] 98.3 F (36.8 C) (07/28 2144) Pulse Rate:  [93-103] 93 (07/29 0005) Resp:  [18-20] 18 (07/29 0005) BP: (111-138)/(83-88) 138/83 mmHg (07/29 0005) SpO2:  [95 %-97 %] 95 % (07/29 0005) General:  Alert and oriented, No acute distress HEENT: Normocephalic, atraumatic Neck: No JVD or lymphadenopathy Cardiovascular: Regular rate and rhythm Lungs: Regular rate and effort Abdomen: Soft, nontender, nondistended, no abdominal masses Back: No CVA tenderness Extremities: No edema Neurologic: Grossly intact  Procedure: Pt was prepped and draped and a new 20Fr coude tip hematuria catheter was placed the balloon inflated and seated at bladder neck.   It irrigated normally and I irrigated in 70 cc aliquots about 500 cc NS until clear. A few small clots were evacuated. He was connected to CBI at about 3 gtt / sec with light red to pink urine. His bladder was very spastic and irrigation was not easy in that regard. On my initial exam above his bladder was soft and not distended, so I suspect the nurse thought his bladder was "distended" because of the bladder spasms. Quantitatively I got out what I put in corroborating an intact bladder.  Laboratory Data:  Results for orders placed during the hospital encounter of 10/07/12 (from the past 24  hour(s))  CBC     Status: Abnormal   Collection Time    10/08/12 12:40 AM      Result Value Range   WBC 10.0  4.0 - 10.5 K/uL   RBC 4.72  4.22 - 5.81 MIL/uL   Hemoglobin 13.8  13.0 - 17.0 g/dL   HCT 38.6 (*) 39.0 - 52.0 %   MCV 81.8  78.0 - 100.0 fL   MCH 29.2  26.0 - 34.0 pg   MCHC 35.8  30.0 - 36.0 g/dL   RDW 12.0  11.5 - 15.5 %   Platelets 230  150 - 400 K/uL   No results found for this or any previous visit (from the past 240 hour(s)). Creatinine: No results found for this basename: CREATININE,  in the last 168 hours  A/P: 1) Gross hematuria - bleeding is ongoing but light. CBI running. Will admit for observation, IVF, abx to cover catheter and irrigation, CBI, NPO. He may need OR tomorrow for cystoscopy and fulguration.   Huda Petrey Ramsey 10/08/2012, 2:49 AM   

## 2012-10-10 NOTE — Interval H&P Note (Signed)
History and Physical Interval Note:  10/10/2012 10:19 AM  Please see his hospital chart including progress notes and discharge summary done yesterday for interval history.  Vincent [name] Code  has presented today for surgery, with the diagnosis of HEMATURIA  The various methods of treatment have been discussed with the patient and family. After consideration of risks, benefits and other options for treatment, the patient has consented to  Procedure(s): CYSTOSCOPY CLOT EVACUATION FULGERATION OF BLEEDERS  (N/A) as a surgical intervention .  The patient's history has been reviewed, patient examined, no change in status, stable for surgery.  I have reviewed the patient's chart and labs.  Questions were answered to the patient's satisfaction.     Garnett Farm

## 2012-10-10 NOTE — Anesthesia Preprocedure Evaluation (Signed)
Anesthesia Evaluation  Patient identified by MRN, date of birth, ID band Patient awake    Reviewed: Allergy & Precautions, H&P , NPO status , Patient's Chart, lab work & pertinent test results  Airway Mallampati: II TM Distance: >3 FB Neck ROM: full    Dental no notable dental hx. (+) Edentulous Upper and Edentulous Lower   Pulmonary COPDCurrent Smoker,  breath sounds clear to auscultation  Pulmonary exam normal       Cardiovascular Exercise Tolerance: Good negative cardio ROS  Rhythm:regular Rate:Normal     Neuro/Psych TIAnegative psych ROS   GI/Hepatic negative GI ROS, Neg liver ROS,   Endo/Other  diabetes, Well Controlled, Type 2, Insulin Dependent and Oral Hypoglycemic AgentsHypothyroidism   Renal/GU negative Renal ROS  negative genitourinary   Musculoskeletal   Abdominal   Peds  Hematology negative hematology ROS (+)   Anesthesia Other Findings   Reproductive/Obstetrics negative OB ROS                           Anesthesia Physical Anesthesia Plan  ASA: III  Anesthesia Plan: General   Post-op Pain Management:    Induction: Intravenous  Airway Management Planned: LMA  Additional Equipment:   Intra-op Plan:   Post-operative Plan:   Informed Consent: I have reviewed the patients History and Physical, chart, labs and discussed the procedure including the risks, benefits and alternatives for the proposed anesthesia with the patient or authorized representative who has indicated his/her understanding and acceptance.   Dental Advisory Given  Plan Discussed with: CRNA and Surgeon  Anesthesia Plan Comments:         Anesthesia Quick Evaluation

## 2012-10-10 NOTE — Transfer of Care (Signed)
Immediate Anesthesia Transfer of Care Note  Patient: Vincent [name]  Procedure(s) Performed: Procedure(s) (LRB): CYSTOSCOPY CLOT EVACUATION FULGERATION OF BLEEDERS  (N/A)  Patient Location: PACU  Anesthesia Type: General  Level of Consciousness: awake, oriented, sedated and patient cooperative  Airway & Oxygen Therapy: Patient Spontanous Breathing and Patient connected to face mask oxygen  Post-op Assessment: Report given to PACU RN and Post -op Vital signs reviewed and stable  Post vital signs: Reviewed and stable  Complications: No apparent anesthesia complications

## 2012-10-10 NOTE — Anesthesia Procedure Notes (Signed)
Procedure Name: LMA Insertion Date/Time: 10/10/2012 10:31 AM Performed by: Renella Cunas D Pre-anesthesia Checklist: Patient identified, Emergency Drugs available, Suction available and Patient being monitored Patient Re-evaluated:Patient Re-evaluated prior to inductionOxygen Delivery Method: Circle System Utilized Preoxygenation: Pre-oxygenation with 100% oxygen Intubation Type: IV induction Ventilation: Mask ventilation without difficulty LMA: LMA inserted LMA Size: 4.0 Number of attempts: 1 Airway Equipment and Method: bite block Placement Confirmation: positive ETCO2 Tube secured with: Tape Dental Injury: Teeth and Oropharynx as per pre-operative assessment

## 2012-10-11 ENCOUNTER — Encounter (HOSPITAL_BASED_OUTPATIENT_CLINIC_OR_DEPARTMENT_OTHER): Payer: Self-pay | Admitting: Urology

## 2012-11-20 ENCOUNTER — Other Ambulatory Visit: Payer: Self-pay | Admitting: Pain Medicine

## 2012-11-20 DIAGNOSIS — A048 Other specified bacterial intestinal infections: Secondary | ICD-10-CM | POA: Insufficient documentation

## 2012-11-20 DIAGNOSIS — M545 Low back pain, unspecified: Secondary | ICD-10-CM

## 2012-11-28 ENCOUNTER — Ambulatory Visit
Admission: RE | Admit: 2012-11-28 | Discharge: 2012-11-28 | Disposition: A | Discharge status: Home / Self Care | Payer: 59 | Source: Ambulatory Visit | Attending: Pain Medicine | Admitting: Pain Medicine

## 2012-11-28 DIAGNOSIS — M545 Low back pain, unspecified: Secondary | ICD-10-CM

## 2013-09-02 ENCOUNTER — Ambulatory Visit
Admission: RE | Admit: 2013-09-02 | Discharge: 2013-09-02 | Disposition: A | Discharge status: Home / Self Care | Payer: 59 | Source: Ambulatory Visit | Attending: Family Medicine | Admitting: Family Medicine

## 2013-09-02 ENCOUNTER — Other Ambulatory Visit: Payer: Self-pay | Admitting: Family Medicine

## 2013-09-02 DIAGNOSIS — M25511 Pain in right shoulder: Secondary | ICD-10-CM

## 2013-09-26 ENCOUNTER — Other Ambulatory Visit: Payer: Self-pay | Admitting: Family Medicine

## 2013-09-26 ENCOUNTER — Ambulatory Visit
Admission: RE | Admit: 2013-09-26 | Discharge: 2013-09-26 | Disposition: A | Discharge status: Home / Self Care | Payer: 59 | Source: Ambulatory Visit | Attending: Family Medicine | Admitting: Family Medicine

## 2013-09-26 DIAGNOSIS — M545 Low back pain: Secondary | ICD-10-CM

## 2013-10-01 ENCOUNTER — Encounter (HOSPITAL_COMMUNITY): Payer: Self-pay | Admitting: Emergency Medicine

## 2013-10-01 DIAGNOSIS — Z8673 Personal history of transient ischemic attack (TIA), and cerebral infarction without residual deficits: Secondary | ICD-10-CM | POA: Insufficient documentation

## 2013-10-01 DIAGNOSIS — M25559 Pain in unspecified hip: Secondary | ICD-10-CM | POA: Insufficient documentation

## 2013-10-01 DIAGNOSIS — E039 Hypothyroidism, unspecified: Secondary | ICD-10-CM | POA: Insufficient documentation

## 2013-10-01 DIAGNOSIS — Z872 Personal history of diseases of the skin and subcutaneous tissue: Secondary | ICD-10-CM | POA: Insufficient documentation

## 2013-10-01 DIAGNOSIS — Z8551 Personal history of malignant neoplasm of bladder: Secondary | ICD-10-CM | POA: Insufficient documentation

## 2013-10-01 DIAGNOSIS — G894 Chronic pain syndrome: Secondary | ICD-10-CM | POA: Insufficient documentation

## 2013-10-01 DIAGNOSIS — K219 Gastro-esophageal reflux disease without esophagitis: Secondary | ICD-10-CM | POA: Insufficient documentation

## 2013-10-01 DIAGNOSIS — E1149 Type 2 diabetes mellitus with other diabetic neurological complication: Secondary | ICD-10-CM | POA: Insufficient documentation

## 2013-10-01 DIAGNOSIS — Z794 Long term (current) use of insulin: Secondary | ICD-10-CM | POA: Insufficient documentation

## 2013-10-01 DIAGNOSIS — IMO0002 Reserved for concepts with insufficient information to code with codable children: Secondary | ICD-10-CM | POA: Insufficient documentation

## 2013-10-01 DIAGNOSIS — J449 Chronic obstructive pulmonary disease, unspecified: Secondary | ICD-10-CM | POA: Insufficient documentation

## 2013-10-01 DIAGNOSIS — Z9889 Other specified postprocedural states: Secondary | ICD-10-CM | POA: Insufficient documentation

## 2013-10-01 DIAGNOSIS — E785 Hyperlipidemia, unspecified: Secondary | ICD-10-CM | POA: Insufficient documentation

## 2013-10-01 DIAGNOSIS — F17200 Nicotine dependence, unspecified, uncomplicated: Secondary | ICD-10-CM | POA: Insufficient documentation

## 2013-10-01 DIAGNOSIS — J4489 Other specified chronic obstructive pulmonary disease: Secondary | ICD-10-CM | POA: Insufficient documentation

## 2013-10-01 DIAGNOSIS — Z87442 Personal history of urinary calculi: Secondary | ICD-10-CM | POA: Insufficient documentation

## 2013-10-01 DIAGNOSIS — E1142 Type 2 diabetes mellitus with diabetic polyneuropathy: Secondary | ICD-10-CM | POA: Insufficient documentation

## 2013-10-01 DIAGNOSIS — Z79899 Other long term (current) drug therapy: Secondary | ICD-10-CM | POA: Insufficient documentation

## 2013-10-01 NOTE — ED Notes (Signed)
Pt. reports left hip pain radiating to left leg x1 week , denies injury/ambulatory using cane , history of chronic pain syndrome - currently taking Dilaudid and Fentanyl patch with no relief.

## 2013-10-02 ENCOUNTER — Emergency Department (HOSPITAL_COMMUNITY)
Admission: EM | Admit: 2013-10-02 | Discharge: 2013-10-02 | Disposition: A | Discharge status: Home / Self Care | Payer: 59 | Attending: Emergency Medicine | Admitting: Emergency Medicine

## 2013-10-02 DIAGNOSIS — M5416 Radiculopathy, lumbar region: Secondary | ICD-10-CM

## 2013-10-02 HISTORY — DX: Chronic pain syndrome: G89.4

## 2013-10-02 MED ORDER — METHOCARBAMOL 500 MG PO TABS
1000.0000 mg | ORAL_TABLET | Freq: Once | ORAL | Status: AC
  Administered 2013-10-02: 1000 mg via ORAL
  Filled 2013-10-02: qty 2

## 2013-10-02 MED ORDER — KETOROLAC TROMETHAMINE 60 MG/2ML IM SOLN
60.0000 mg | Freq: Once | INTRAMUSCULAR | Status: AC
  Administered 2013-10-02: 60 mg via INTRAMUSCULAR
  Filled 2013-10-02: qty 2

## 2013-10-02 MED ORDER — METHOCARBAMOL 500 MG PO TABS: 1000.0000 mg | ORAL_TABLET | Freq: Two times a day (BID) | ORAL | Status: DC | PRN

## 2013-10-02 MED ORDER — HYDROMORPHONE HCL PF 1 MG/ML IJ SOLN
2.0000 mg | Freq: Once | INTRAMUSCULAR | Status: AC
Start: 2013-10-02 — End: 2013-10-02
  Administered 2013-10-02: 2 mg via INTRAMUSCULAR
  Filled 2013-10-02: qty 2

## 2013-10-02 NOTE — ED Provider Notes (Signed)
CSN: [id number]     Arrival date & time 10/01/13  1913 History   First MD Initiated Contact with Patient 10/02/13 0043     Chief Complaint  Patient presents with  . Hip Pain     (Consider location/radiation/quality/duration/timing/severity/associated sxs/prior Treatment) HPI Patient has a history of chronic low back pain for which he goes to pain management. He's had no patches and periodic oral Dilaudid. He states the pain has been worse for the past week. He begins in his low back and radiates down the posterior left leg. He says it radiates all the way to the foot. He's had no bladder or bowel incontinence. He's had no weakness or numbness. He states he is using a cane due to the pain. Past Medical History  Diagnosis Date  . COPD (chronic obstructive pulmonary disease)   . GERD (gastroesophageal reflux disease)   . Neuropathy in diabetes     LOWER EXTREMITIES  . Hypothyroidism   . History of TIA (transient ischemic attack) 2008--- NO RESIDUAL  . Diabetes mellitus, type 2   . H/O hiatal hernia   . History of kidney stones   . Skin sore     BILATERAL HANDS AND FOREARMS--  FROM HYDROCODONE ALLERGIC REACTION 1 YR AGO--  SLOW TO HEAL  . Urgency of urination   . Gastroparesis   . Bladder cancer   . Hematuria   . Hyperlipidemia   . History of gastric ulcer   . Chronic pain syndrome    Past Surgical History  Procedure Laterality Date  . Rotator cuff repair Right 12/2004  . Carpal tunnel release Right 09-16-2003  . Laparoscopic cholecystectomy  11-17-2010  . Cardiac catheterization  12-27-2001  DR Einar Gip  &  05-26-2009  DR VARANASI    RESULTS FOR BOTH ARE NORMAL CORONARIES AND PERSERVED LVF/ EF 60%  . Transurethral resection of bladder tumor N/A 08/09/2012    Procedure: TRANSURETHRAL RESECTION OF BLADDER TUMOR (TURBT) WITH GYRUS WITH MITOMYCIN C;  Surgeon: Claybon Jabs, MD;  Location: [facility];  Service: Urology;  Laterality: N/A;  . Cystoscopy N/A 10/10/2012   Procedure: CYSTOSCOPY CLOT EVACUATION FULGERATION OF BLEEDERS ;  Surgeon: Claybon Jabs, MD;  Location: [facility];  Service: Urology;  Laterality: N/A;   Family History  Problem Relation Age of Onset  . Diabetes Mother   . Diabetes Father   . Hypertension Father   . Kidney disease Father    History  Substance Use Topics  . Smoking status: Current Every Day Smoker -- 2.00 packs/day for 20 years    Types: Cigarettes  . Smokeless tobacco: Never Used  . Alcohol Use: Yes     Comment: RARE    Review of Systems  Constitutional: Negative for fever and chills.  Respiratory: Negative for cough and shortness of breath.   Cardiovascular: Negative for chest pain.  Gastrointestinal: Negative for nausea, vomiting, abdominal pain, diarrhea and constipation.  Genitourinary: Negative for dysuria, frequency, hematuria and difficulty urinating.  Musculoskeletal: Positive for back pain. Negative for neck pain and neck stiffness.  Skin: Negative for rash and wound.  Neurological: Negative for dizziness, syncope, weakness, light-headedness, numbness and headaches.  All other systems reviewed and are negative.     Allergies  Hydrocodone-acetaminophen; Celebrex; and Sulfa antibiotics  Home Medications   Prior to Admission medications   Medication Sig Start Date End Date Taking? Authorizing Provider  amitriptyline (ELAVIL) 100 MG tablet Take 100 mg by mouth at bedtime.   Yes  Historical Provider, MD  atorvastatin (LIPITOR) 40 MG tablet Take 40 mg by mouth every morning.    Yes Historical Provider, MD  Coenzyme Q10 (CO Q 10) 10 MG CAPS Take 1 tablet by mouth daily.    Yes Historical Provider, MD  esomeprazole (NEXIUM) 20 MG capsule Take 20 mg by mouth daily before breakfast.   Yes Historical Provider, MD  fentaNYL (DURAGESIC - DOSED MCG/HR) 100 MCG/HR Place 100 mcg onto the skin every 3 (three) days.   Yes Historical Provider, MD  fish oil-omega-3 fatty acids 1000 MG capsule Take  1,000-2,000 mg by mouth 2 (two) times daily. Take 1 capsule in the morning and 2 capsules in the evening   Yes Historical Provider, MD  FLUoxetine (PROZAC) 40 MG capsule Take 40 mg by mouth 2 (two) times daily.    Yes Historical Provider, MD  gabapentin (NEURONTIN) 600 MG tablet Take 600 mg by mouth 4 (four) times daily.   Yes Historical Provider, MD  HYDROmorphone (DILAUDID) 2 MG tablet Take 2 mg by mouth every 4 (four) hours as needed for pain.   Yes Historical Provider, MD  insulin lispro protamine-insulin lispro (HUMALOG 75/25) (75-25) 100 UNIT/ML SUSP Inject 20-60 Units into the skin 3 (three) times daily before meals. Take 60 units in the morning and with dinner and 20 units at lunch   Yes Historical Provider, MD  levothyroxine (SYNTHROID, LEVOTHROID) 25 MCG tablet Take 25 mcg by mouth daily before breakfast.    Yes Historical Provider, MD  metFORMIN (GLUCOPHAGE) 1000 MG tablet Take 1,000 mg by mouth 2 (two) times daily with a meal.   Yes Historical Provider, MD  metoCLOPramide (REGLAN) 5 MG tablet Take 5 mg by mouth 4 (four) times daily.   Yes Historical Provider, MD  pregabalin (LYRICA) 150 MG capsule Take 150 mg by mouth 3 (three) times daily.   Yes Historical Provider, MD   BP 127/90  Pulse 89  Temp(Src) 97.8 F (36.6 C) (Oral)  Resp 16  SpO2 100% Physical Exam  Nursing note and vitals reviewed. Constitutional: He is oriented to person, place, and time. He appears well-developed and well-nourished. No distress.  HENT:  Head: Normocephalic and atraumatic.  Mouth/Throat: Oropharynx is clear and moist.  Eyes: EOM are normal. Pupils are equal, round, and reactive to light.  Neck: Normal range of motion. Neck supple.  Cardiovascular: Normal rate and regular rhythm.   Pulmonary/Chest: Effort normal and breath sounds normal. No respiratory distress. He has no wheezes. He has no rales.  Abdominal: Soft. Bowel sounds are normal. He exhibits no distension and no mass. There is no  tenderness. There is no rebound and no guarding.  Musculoskeletal: Normal range of motion. He exhibits tenderness (tenderness in the leftparaspinal lumbar and sacral area. Positive straight-leg raise on the left.). He exhibits no edema.  Distal pulses intact. No calf swelling or tenderness.  Neurological: He is alert and oriented to person, place, and time.  5/5 motor in all extremities. Sensation is intact.  Skin: Skin is warm and dry. No rash noted. No erythema.  Psychiatric: He has a normal mood and affect. His behavior is normal.    ED Course  Procedures (including critical care time) Labs Review Labs Reviewed - No data to display  Imaging Review No results found.   EKG Interpretation None      MDM   Final diagnoses:  None    Patient presents with exacerbation of his chronic radicular lumbar pain. Recent plane films of the lumbar  spine with no abnormalities. Patient also had MRI late last year with very mild radicular disease. We'll treat symptomatically and have patient followup with his chronic pain doctor.    Julianne Rice, MD 10/02/13 [phone]

## 2013-10-02 NOTE — Discharge Instructions (Signed)
Lumbosacral Radiculopathy °Lumbosacral radiculopathy is a pinched nerve or nerves in the low back (lumbosacral area). When this happens you may have weakness in your legs and may not be able to stand on your toes. You may have pain going down into your legs. There may be difficulties with walking normally. There are many causes of this problem. Sometimes this may happen from an injury, or simply from arthritis or boney problems. It may also be caused by other illnesses such as diabetes. If there is no improvement after treatment, further studies may be done to find the exact cause. °DIAGNOSIS  °X-rays may be needed if the problems become long standing. Electromyograms may be done. This study is one in which the working of nerves and muscles is studied. °HOME CARE INSTRUCTIONS  °· Applications of ice packs may be helpful. Ice can be used in a plastic bag with a towel around it to prevent frostbite to skin. This may be used every 2 hours for 20 to 30 minutes, or as needed, while awake, or as directed by your caregiver. °· Only take over-the-counter or prescription medicines for pain, discomfort, or fever as directed by your caregiver. °· If physical therapy was prescribed, follow your caregiver's directions. °SEEK IMMEDIATE MEDICAL CARE IF:  °· You have pain not controlled with medications. °· You seem to be getting worse rather than better. °· You develop increasing weakness in your legs. °· You develop loss of bowel or bladder control. °· You have difficulty with walking or balance, or develop clumsiness in the use of your legs. °· You have a fever. °MAKE SURE YOU:  °· Understand these instructions. °· Will watch your condition. °· Will get help right away if you are not doing well or get worse. °Document Released: 02/27/2005 Document Revised: 05/22/2011 Document Reviewed: 10/18/2007 °[employer]® Patient Information ©2015 [employer]. This information is not intended to replace advice given to you by your health  care provider. Make sure you discuss any questions you have with your health care provider. ° °

## 2013-11-06 ENCOUNTER — Emergency Department (HOSPITAL_COMMUNITY)
Admission: EM | Admit: 2013-11-06 | Discharge: 2013-11-06 | Disposition: A | Discharge status: Home / Self Care | Payer: 59 | Attending: Emergency Medicine | Admitting: Emergency Medicine

## 2013-11-06 ENCOUNTER — Emergency Department (HOSPITAL_COMMUNITY): Payer: 59

## 2013-11-06 ENCOUNTER — Encounter (HOSPITAL_COMMUNITY): Payer: Self-pay | Admitting: Emergency Medicine

## 2013-11-06 DIAGNOSIS — Z87442 Personal history of urinary calculi: Secondary | ICD-10-CM | POA: Insufficient documentation

## 2013-11-06 DIAGNOSIS — Z872 Personal history of diseases of the skin and subcutaneous tissue: Secondary | ICD-10-CM | POA: Diagnosis not present

## 2013-11-06 DIAGNOSIS — Z9889 Other specified postprocedural states: Secondary | ICD-10-CM | POA: Insufficient documentation

## 2013-11-06 DIAGNOSIS — Z8551 Personal history of malignant neoplasm of bladder: Secondary | ICD-10-CM | POA: Insufficient documentation

## 2013-11-06 DIAGNOSIS — J449 Chronic obstructive pulmonary disease, unspecified: Secondary | ICD-10-CM | POA: Diagnosis not present

## 2013-11-06 DIAGNOSIS — E785 Hyperlipidemia, unspecified: Secondary | ICD-10-CM | POA: Diagnosis not present

## 2013-11-06 DIAGNOSIS — H547 Unspecified visual loss: Secondary | ICD-10-CM | POA: Diagnosis present

## 2013-11-06 DIAGNOSIS — Z8673 Personal history of transient ischemic attack (TIA), and cerebral infarction without residual deficits: Secondary | ICD-10-CM | POA: Diagnosis not present

## 2013-11-06 DIAGNOSIS — E1149 Type 2 diabetes mellitus with other diabetic neurological complication: Secondary | ICD-10-CM | POA: Insufficient documentation

## 2013-11-06 DIAGNOSIS — E1142 Type 2 diabetes mellitus with diabetic polyneuropathy: Secondary | ICD-10-CM | POA: Insufficient documentation

## 2013-11-06 DIAGNOSIS — E039 Hypothyroidism, unspecified: Secondary | ICD-10-CM | POA: Insufficient documentation

## 2013-11-06 DIAGNOSIS — Z79899 Other long term (current) drug therapy: Secondary | ICD-10-CM | POA: Diagnosis not present

## 2013-11-06 DIAGNOSIS — G894 Chronic pain syndrome: Secondary | ICD-10-CM | POA: Insufficient documentation

## 2013-11-06 DIAGNOSIS — H538 Other visual disturbances: Secondary | ICD-10-CM | POA: Diagnosis not present

## 2013-11-06 DIAGNOSIS — F17200 Nicotine dependence, unspecified, uncomplicated: Secondary | ICD-10-CM | POA: Diagnosis not present

## 2013-11-06 DIAGNOSIS — K219 Gastro-esophageal reflux disease without esophagitis: Secondary | ICD-10-CM | POA: Diagnosis not present

## 2013-11-06 DIAGNOSIS — Z794 Long term (current) use of insulin: Secondary | ICD-10-CM | POA: Diagnosis not present

## 2013-11-06 DIAGNOSIS — J4489 Other specified chronic obstructive pulmonary disease: Secondary | ICD-10-CM | POA: Insufficient documentation

## 2013-11-06 DIAGNOSIS — H539 Unspecified visual disturbance: Secondary | ICD-10-CM

## 2013-11-06 NOTE — ED Provider Notes (Signed)
CSN: [id number]     Arrival date & time 11/06/13  1751 History   First MD Initiated Contact with Patient 11/06/13 2027     Chief Complaint  Patient presents with  . Eye Problem     (Consider location/radiation/quality/duration/timing/severity/associated sxs/prior Treatment) HPI Comments: Sent from Dr. Marin Comment (optometry) for concern of retinal detachment.  Patient is a 48 y.o. male presenting with eye problem. The history is provided by the patient.  Eye Problem Location:  Both Quality: decreased vision. Severity:  Moderate Onset quality:  Sudden Timing:  Constant Progression:  Improving Chronicity:  New Context: not burn and not chemical exposure   Relieved by:  Nothing Worsened by:  Nothing tried   Past Medical History  Diagnosis Date  . COPD (chronic obstructive pulmonary disease)   . GERD (gastroesophageal reflux disease)   . Neuropathy in diabetes     LOWER EXTREMITIES  . Hypothyroidism   . History of TIA (transient ischemic attack) 2008--- NO RESIDUAL  . Diabetes mellitus, type 2   . H/O hiatal hernia   . History of kidney stones   . Skin sore     BILATERAL HANDS AND FOREARMS--  FROM HYDROCODONE ALLERGIC REACTION 1 YR AGO--  SLOW TO HEAL  . Urgency of urination   . Gastroparesis   . Bladder cancer   . Hematuria   . Hyperlipidemia   . History of gastric ulcer   . Chronic pain syndrome    Past Surgical History  Procedure Laterality Date  . Rotator cuff repair Right 12/2004  . Carpal tunnel release Right 09-16-2003  . Laparoscopic cholecystectomy  11-17-2010  . Cardiac catheterization  12-27-2001  DR Einar Gip  &  05-26-2009  DR VARANASI    RESULTS FOR BOTH ARE NORMAL CORONARIES AND PERSERVED LVF/ EF 60%  . Transurethral resection of bladder tumor N/A 08/09/2012    Procedure: TRANSURETHRAL RESECTION OF BLADDER TUMOR (TURBT) WITH GYRUS WITH MITOMYCIN C;  Surgeon: Claybon Jabs, MD;  Location: [facility];  Service: Urology;  Laterality: N/A;  .  Cystoscopy N/A 10/10/2012    Procedure: CYSTOSCOPY CLOT EVACUATION FULGERATION OF BLEEDERS ;  Surgeon: Claybon Jabs, MD;  Location: [place];  Service: Urology;  Laterality: N/A;   Family History  Problem Relation Age of Onset  . Diabetes Mother   . Diabetes Father   . Hypertension Father   . Kidney disease Father    History  Substance Use Topics  . Smoking status: Current Every Day Smoker -- 2.00 packs/day for 20 years    Types: Cigarettes  . Smokeless tobacco: Never Used  . Alcohol Use: Yes     Comment: RARE    Review of Systems  Constitutional: Negative for fever.  Respiratory: Negative for cough and shortness of breath.   All other systems reviewed and are negative.     Allergies  Hydrocodone-acetaminophen; Celebrex; and Sulfa antibiotics  Home Medications   Prior to Admission medications   Medication Sig Start Date End Date Taking? Authorizing Provider  amitriptyline (ELAVIL) 100 MG tablet Take 100 mg by mouth at bedtime.   Yes Historical Provider, MD  atorvastatin (LIPITOR) 40 MG tablet Take 40 mg by mouth every morning.    Yes Historical Provider, MD  Coenzyme Q10 (CO Q 10) 10 MG CAPS Take 1 tablet by mouth daily.    Yes Historical Provider, MD  esomeprazole (NEXIUM) 20 MG capsule Take 20 mg by mouth daily before breakfast.   Yes Historical Provider, MD  fentaNYL (DURAGESIC - DOSED MCG/HR) 100 MCG/HR Place 100 mcg onto the skin every 3 (three) days.   Yes Historical Provider, MD  fish oil-omega-3 fatty acids 1000 MG capsule Take 1,000-2,000 mg by mouth 2 (two) times daily. Take 1 capsule in the morning and 2 capsules in the evening   Yes Historical Provider, MD  FLUoxetine (PROZAC) 40 MG capsule Take 40 mg by mouth 2 (two) times daily.    Yes Historical Provider, MD  gabapentin (NEURONTIN) 600 MG tablet Take 600 mg by mouth 4 (four) times daily.   Yes Historical Provider, MD  insulin lispro protamine-insulin lispro (HUMALOG 75/25) (75-25) 100  UNIT/ML SUSP Inject 20-60 Units into the skin 3 (three) times daily before meals. Take 60 units in the morning and with dinner and 20 units at lunch   Yes Historical Provider, MD  levothyroxine (SYNTHROID, LEVOTHROID) 25 MCG tablet Take 25 mcg by mouth daily before breakfast.    Yes Historical Provider, MD  metFORMIN (GLUCOPHAGE) 1000 MG tablet Take 1,000 mg by mouth 2 (two) times daily with a meal.   Yes Historical Provider, MD  metoCLOPramide (REGLAN) 5 MG tablet Take 5 mg by mouth 4 (four) times daily.   Yes Historical Provider, MD  HYDROmorphone (DILAUDID) 2 MG tablet Take 2 mg by mouth every 4 (four) hours as needed for pain.    Historical Provider, MD   BP 149/89  Pulse 86  Temp(Src) 97.7 F (36.5 C) (Oral)  Resp 18  Ht 6' (1.829 m)  Wt 225 lb (102.059 kg)  BMI 30.51 kg/m2  SpO2 98% Physical Exam  Nursing note and vitals reviewed. Constitutional: He is oriented to person, place, and time. He appears well-developed and well-nourished. No distress.  HENT:  Head: Normocephalic and atraumatic.  Mouth/Throat: No oropharyngeal exudate.  Eyes: EOM are normal. Pupils are equal, round, and reactive to light.  Dilated, pupils barely reactive.   Neck: Normal range of motion. Neck supple.  Cardiovascular: Normal rate and regular rhythm.  Exam reveals no friction rub.   No murmur heard. Pulmonary/Chest: Effort normal and breath sounds normal. No respiratory distress. He has no wheezes. He has no rales.  Abdominal: He exhibits no distension. There is no tenderness. There is no rebound.  Musculoskeletal: Normal range of motion. He exhibits no edema.  Neurological: He is alert and oriented to person, place, and time.  Skin: He is not diaphoretic.    ED Course  Procedures (including critical care time) Labs Review Labs Reviewed  CBC  BASIC METABOLIC PANEL    Imaging Review Ct Head Wo Contrast  11/06/2013   CLINICAL DATA:  Altered mental status.  Visual disturbance.  EXAM: CT HEAD  WITHOUT CONTRAST  TECHNIQUE: Contiguous axial images were obtained from the base of the skull through the vertex without intravenous contrast.  COMPARISON:  MRI 08/01/2011.  FINDINGS: No mass lesion, mass effect, midline shift, hydrocephalus, hemorrhage. No territorial ischemia or acute infarction. Mastoid air cells are clear. Globes appear within normal limits.  IMPRESSION: Negative CT head.   Electronically Signed   By: Dereck Ligas M.D.   On: 11/06/2013 21:39     EKG Interpretation None      MDM   Final diagnoses:  Vision changes    48yo male sent from optometrists office for vision changes. Concern for retinal detachment per Dr. Marin Comment. States acute onset of blurry vision with flashing lights in the periphery left greater than right. He had a dilated exam at the optometrist office and  was sent here for further eval. US of the eye without any glaring problems, no large retinal detachment. Head CT normal. I spoke with Dr. Lucita Ferrara who will see the patient at 7 am. Stable for discharge.    Evelina Bucy, MD 11/06/13 [phone]

## 2013-11-06 NOTE — Discharge Instructions (Signed)
Blurred Vision °You have been seen today complaining of blurred vision. This means you have a loss of ability to see small details.  °CAUSES  °Blurred vision can be a symptom of underlying eye problems, such as: °· Aging of the eye (presbyopia). °· Glaucoma. °· Cataracts. °· Eye infection. °· Eye-related migraine. °· Diabetes mellitus. °· Fatigue. °· Migraine headaches. °· High blood pressure. °· Breakdown of the back of the eye (macular degeneration). °· Problems caused by some medications. °The most common cause of blurred vision is the need for eyeglasses or a new prescription. Today in the emergency department, no cause for your blurred vision can be found. °SYMPTOMS  °Blurred vision is the loss of visual sharpness and detail (acuity). °DIAGNOSIS  °Should blurred vision continue, you should see your caregiver. If your caregiver is your primary care physician, he or she may choose to refer you to another specialist.  °TREATMENT  °Do not ignore your blurred vision. Make sure to have it checked out to see if further treatment or referral is necessary. °SEEK MEDICAL CARE IF:  °You are unable to get into a specialist so we can help you with a referral. °SEEK IMMEDIATE MEDICAL CARE IF: °You have severe eye pain, severe headache, or sudden loss of vision. °MAKE SURE YOU:  °· Understand these instructions. °· Will watch your condition. °· Will get help right away if you are not doing well or get worse. °Document Released: 03/02/2003 Document Revised: 05/22/2011 Document Reviewed: 10/02/2007 °Exit[employer]® Patient Information ©2015 [employer]. This information is not intended to replace advice given to you by your health care provider. Make sure you discuss any questions you have with your health care provider. ° °
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
## 2013-11-06 NOTE — ED Notes (Signed)
Dr. Mingo Amber screening patient.
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
## 2013-12-09 ENCOUNTER — Other Ambulatory Visit: Payer: Self-pay | Admitting: Urology

## 2013-12-30 ENCOUNTER — Encounter (HOSPITAL_BASED_OUTPATIENT_CLINIC_OR_DEPARTMENT_OTHER): Payer: Self-pay | Admitting: *Deleted

## 2013-12-30 NOTE — Progress Notes (Signed)
Spoke w/ wife. Instructions given for pt to be npo p mn 10/22 x nexium, synthroid, lipitor,prozac,gabapentin w sip of water.  No smoking after mn as well. To [facility] 10/23 @ 0600.  Needs istat on arrival.  ekg w chart.

## 2014-01-02 ENCOUNTER — Encounter (HOSPITAL_BASED_OUTPATIENT_CLINIC_OR_DEPARTMENT_OTHER)
Admission: RE | Disposition: A | Discharge status: Home / Self Care | Payer: Self-pay | Source: Ambulatory Visit | Attending: Urology

## 2014-01-02 ENCOUNTER — Ambulatory Visit (HOSPITAL_BASED_OUTPATIENT_CLINIC_OR_DEPARTMENT_OTHER): Payer: 59 | Admitting: Anesthesiology

## 2014-01-02 ENCOUNTER — Encounter (HOSPITAL_BASED_OUTPATIENT_CLINIC_OR_DEPARTMENT_OTHER): Payer: Self-pay

## 2014-01-02 ENCOUNTER — Ambulatory Visit (HOSPITAL_BASED_OUTPATIENT_CLINIC_OR_DEPARTMENT_OTHER)
Admission: RE | Admit: 2014-01-02 | Discharge: 2014-01-02 | Disposition: A | Discharge status: Home / Self Care | Payer: 59 | Source: Ambulatory Visit | Attending: Urology | Admitting: Urology

## 2014-01-02 ENCOUNTER — Other Ambulatory Visit: Payer: Self-pay | Admitting: Urology

## 2014-01-02 ENCOUNTER — Encounter (HOSPITAL_BASED_OUTPATIENT_CLINIC_OR_DEPARTMENT_OTHER): Payer: 59 | Admitting: Anesthesiology

## 2014-01-02 DIAGNOSIS — Z882 Allergy status to sulfonamides status: Secondary | ICD-10-CM | POA: Insufficient documentation

## 2014-01-02 DIAGNOSIS — K219 Gastro-esophageal reflux disease without esophagitis: Secondary | ICD-10-CM | POA: Insufficient documentation

## 2014-01-02 DIAGNOSIS — F17210 Nicotine dependence, cigarettes, uncomplicated: Secondary | ICD-10-CM | POA: Insufficient documentation

## 2014-01-02 DIAGNOSIS — Z539 Procedure and treatment not carried out, unspecified reason: Secondary | ICD-10-CM | POA: Insufficient documentation

## 2014-01-02 DIAGNOSIS — I10 Essential (primary) hypertension: Secondary | ICD-10-CM | POA: Insufficient documentation

## 2014-01-02 DIAGNOSIS — Z794 Long term (current) use of insulin: Secondary | ICD-10-CM | POA: Insufficient documentation

## 2014-01-02 DIAGNOSIS — E119 Type 2 diabetes mellitus without complications: Secondary | ICD-10-CM | POA: Diagnosis not present

## 2014-01-02 DIAGNOSIS — E780 Pure hypercholesterolemia: Secondary | ICD-10-CM | POA: Insufficient documentation

## 2014-01-02 DIAGNOSIS — Z8550 Personal history of malignant neoplasm of unspecified urinary tract organ: Secondary | ICD-10-CM | POA: Diagnosis not present

## 2014-01-02 DIAGNOSIS — F419 Anxiety disorder, unspecified: Secondary | ICD-10-CM | POA: Diagnosis not present

## 2014-01-02 DIAGNOSIS — F329 Major depressive disorder, single episode, unspecified: Secondary | ICD-10-CM | POA: Diagnosis not present

## 2014-01-02 DIAGNOSIS — Z888 Allergy status to other drugs, medicaments and biological substances status: Secondary | ICD-10-CM | POA: Insufficient documentation

## 2014-01-02 DIAGNOSIS — E039 Hypothyroidism, unspecified: Secondary | ICD-10-CM | POA: Insufficient documentation

## 2014-01-02 DIAGNOSIS — Z8673 Personal history of transient ischemic attack (TIA), and cerebral infarction without residual deficits: Secondary | ICD-10-CM | POA: Diagnosis not present

## 2014-01-02 DIAGNOSIS — E291 Testicular hypofunction: Secondary | ICD-10-CM | POA: Insufficient documentation

## 2014-01-02 DIAGNOSIS — Z79899 Other long term (current) drug therapy: Secondary | ICD-10-CM | POA: Diagnosis not present

## 2014-01-02 LAB — POCT I-STAT 4, (NA,K, GLUC, HGB,HCT)
Glucose, Bld: 656 mg/dL (ref 70–99)
HCT: 50 % (ref 39.0–52.0)
Hemoglobin: 17 g/dL (ref 13.0–17.0)
Potassium: 4.4 mEq/L (ref 3.7–5.3)
Sodium: 131 mEq/L — ABNORMAL LOW (ref 137–147)

## 2014-01-02 LAB — GLUCOSE, CAPILLARY: Glucose-Capillary: >600 mg/dL (ref 70–99)

## 2014-01-02 SURGERY — TRANSURETHRAL RESECTION OF BLADDER TUMOR WITH GYRUS (TURBT-GYRUS)
Anesthesia: General

## 2014-01-02 MED ORDER — FENTANYL CITRATE 0.05 MG/ML IJ SOLN
INTRAMUSCULAR | Status: AC
  Filled 2014-01-02: qty 4

## 2014-01-02 MED ORDER — LACTATED RINGERS IV SOLN
INTRAVENOUS | Status: DC
  Administered 2014-01-02: 07:00:00 via INTRAVENOUS
  Filled 2014-01-02: qty 1000

## 2014-01-02 MED ORDER — MIDAZOLAM HCL 2 MG/2ML IJ SOLN
INTRAMUSCULAR | Status: AC
  Filled 2014-01-02: qty 2

## 2014-01-02 MED ORDER — CIPROFLOXACIN IN D5W 200 MG/100ML IV SOLN
200.0000 mg | INTRAVENOUS | Status: DC
  Filled 2014-01-02: qty 100

## 2014-01-02 MED ORDER — CIPROFLOXACIN IN D5W 200 MG/100ML IV SOLN
INTRAVENOUS | Status: AC
  Filled 2014-01-02: qty 100

## 2014-01-02 SURGICAL SUPPLY — 30 items
BAG DRAIN URO-CYSTO SKYTR STRL (DRAIN) ×1 IMPLANT
BAG DRN ANRFLXCHMBR STRAP LEK (BAG)
BAG DRN UROCATH (DRAIN)
BAG URINE DRAINAGE (UROLOGICAL SUPPLIES) IMPLANT
BAG URINE LEG 19OZ MD ST LTX (BAG) IMPLANT
CANISTER SUCT LVC 12 LTR MEDI- (MISCELLANEOUS) IMPLANT
CATH FOLEY 3WAY 30CC 24FR (CATHETERS)
CATH HEMA 3WAY 30CC 24FR COUDE (CATHETERS) IMPLANT
CATH HEMA 3WAY 30CC 24FR RND (CATHETERS) IMPLANT
CATH URTH STD 24FR FL 3W 2 (CATHETERS) ×1 IMPLANT
CLOTH BEACON ORANGE TIMEOUT ST (SAFETY) ×1 IMPLANT
DRAPE CAMERA CLOSED 9X96 (DRAPES) ×1 IMPLANT
ELECT BUTTON BIOP 24F 90D PLAS (MISCELLANEOUS) IMPLANT
ELECT BUTTON HF 24-28F 2 30DE (ELECTRODE) IMPLANT
ELECT LOOP MED HF 24F 12D CBL (CLIP) ×1 IMPLANT
ELECT REM PT RETURN 9FT ADLT (ELECTROSURGICAL)
ELECT RESECT VAPORIZE 12D CBL (ELECTRODE) ×1 IMPLANT
ELECTRODE REM PT RTRN 9FT ADLT (ELECTROSURGICAL) ×1 IMPLANT
EVACUATOR MICROVAS BLADDER (UROLOGICAL SUPPLIES) IMPLANT
GLOVE BIO SURGEON STRL SZ8 (GLOVE) ×1 IMPLANT
GOWN PREVENTION PLUS LG XLONG (DISPOSABLE) ×1 IMPLANT
GOWN STRL REIN XL XLG (GOWN DISPOSABLE) ×1 IMPLANT
HOLDER FOLEY CATH W/STRAP (MISCELLANEOUS) IMPLANT
IV NS IRRIG 3000ML ARTHROMATIC (IV SOLUTION) IMPLANT
PACK CYSTO (CUSTOM PROCEDURE TRAY) ×1 IMPLANT
PLUG CATH AND CAP STER (CATHETERS) IMPLANT
SET ASPIRATION TUBING (TUBING) ×1 IMPLANT
SYR 30ML LL (SYRINGE) IMPLANT
SYRINGE IRR TOOMEY STRL 70CC (SYRINGE) IMPLANT
WATER STERILE IRR 3000ML UROMA (IV SOLUTION) IMPLANT

## 2014-01-02 NOTE — Anesthesia Preprocedure Evaluation (Deleted)
Anesthesia Evaluation  Patient identified by MRN, date of birth, ID band Patient awake    Reviewed: Allergy & Precautions, H&P , NPO status , Patient's Chart, lab work & pertinent test results  Airway Mallampati: II TM Distance: >3 FB Neck ROM: Full    Dental no notable dental hx.    Pulmonary COPDCurrent Smoker,  breath sounds clear to auscultation  Pulmonary exam normal       Cardiovascular negative cardio ROS  Rhythm:Regular Rate:Normal     Neuro/Psych negative neurological ROS  negative psych ROS   GI/Hepatic Neg liver ROS, hiatal hernia, GERD-  Medicated,  Endo/Other  diabetes, Type 2, Oral Hypoglycemic Agents, Insulin DependentHypothyroidism   Renal/GU negative Renal ROS  negative genitourinary   Musculoskeletal negative musculoskeletal ROS (+)   Abdominal   Peds negative pediatric ROS (+) Delivery details - Hematology negative hematology ROS (+)   Anesthesia Other Findings   Reproductive/Obstetrics negative OB ROS                        Anesthesia Physical Anesthesia Plan  ASA: III  Anesthesia Plan: General   Post-op Pain Management:    Induction: Intravenous  Airway Management Planned: LMA  Additional Equipment:   Intra-op Plan:   Post-operative Plan: Extubation in OR  Informed Consent: I have reviewed the patients History and Physical, chart, labs and discussed the procedure including the risks, benefits and alternatives for the proposed anesthesia with the patient or authorized representative who has indicated his/her understanding and acceptance.   Dental advisory given  Plan Discussed with: CRNA  Anesthesia Plan Comments: (Istat blood glucose  638 and 656 today on two separate checks. States he had metformin yesterday and insulin last night at [facility]. NPO today. Patient feels fine today, with no symptoms of hyperglycemia. BP 112/91, HR 111, RR 16, O2sat 99%. Hands with  chronic infection on dorsal sides. Diabetes managed by Dr. Arelia Sneddon. Discussed with Dr. Karsten Ro. Procedure is not urgent. Plan to cancel today, reschedule and refer patient back to primary care provider for management.)      Anesthesia Quick Evaluation

## 2014-01-02 NOTE — H&P (Signed)
History of Present Illness     History of phimosis: He developed phimosis secondary to diabetes in 10/03 and underwent a circumcision in 2/05.    Transitional cell carcinoma of the bladder: He was found to have 7-10 rbc's/hpf. He has a 60-pack-year smoking history. He was found to have a papillary tumor on the left posterior wall of his bladder and underwent TURBT with postoperative mitomycin-C on 08/09/12.  Pathology: Papillary transitional cell carcinoma (Ta,G1)    Prostatic asymmetry: He was found on DRE to have some right lobe enlargement relative to the left however there were no worrisome findings and his PSA in 4/14 was 0.47.    LUTS: He noted the acute onset of hesitancy and intermittency with some associated suprapubic tenderness and intermittent bilateral testicular pain without associated dysuria.    Primary hypogonadism: He was experiencing a decreased libido and diminished erectile quality. He was found to have a serum of 167. Further evaluation revealed a normal FSH, LH and prolactin indicating primary hypogonadism.   Previously tried: AndroGel 2 pumps & Testim  Current therapy: Testosterone injection    Interval history: He reports he's developed some hesitancy but denies any hematuria or dysuria. He is no longer using Testim and reports that he is receiving testosterone injections from his primary care physician. The dosage that he tells me he is receiving is 100 mg every 3 weeks. It's been 3 weeks since his last injection. His wife says it makes him "mean".         Past Medical History Problems  1. History of Anxiety (300.00) 2. Carcinoma of bladder (188.9) 3. History of Diabetic Ketoacidosis Type 2 (250.10) 4. History of Gastric ulcer (531.90) 5. History of depression (V11.8) 6. History of diabetes mellitus (V12.29) 7. History of esophageal reflux (V12.79) 8. History of hypercholesterolemia (V12.29) 9. History of hypertension (V12.59) 10. History of  hypothyroidism (V12.29) 11. History of Ischemic Stroke (434.90)  Surgical History Problems  1. History of Bladder Injection Of Cancer Treatment 2. History of Cholecystectomy 3. History of Cystoscopy With Fulguration Medium Lesion (2-5cm) 4. History of Elective Circumcision 5. History of Rotator Cuff Repair 6. History of Wrist Arthroscopy With Release Of Transverse Carpal Ligament  Current Meds 1. Amitriptyline HCl TABS;  Therapy: (Recorded:24Sep2015) to Recorded 2. Atorvastatin Calcium 10 MG Oral Tablet;  Therapy: [id number] to Recorded 3. Duragesic-100 100 MCG/HR Transdermal Patch 72 Hour;  Therapy: (Recorded:26May2015) to Recorded 4. FLUoxetine HCl - 40 MG Oral Capsule;  Therapy: 27Mar2013 to Recorded 5. Gabapentin 600 MG Oral Tablet;  Therapy: [id number] to Recorded 6. HumaLOG Mix 75/25 KwikPen (75-25) 100 UNIT/ML Subcutaneous Suspension  Pen-injector;  Therapy: [id number] to Recorded 7. HYDROmorphone HCl - 2 MG Oral Tablet;  Therapy: [id number] to Recorded 8. Levothyroxine Sodium 25 MCG Oral Tablet;  Therapy: [id number] to Recorded 9. MetFORMIN HCl - 1000 MG Oral Tablet;  Therapy: [id number] to Recorded 10. Metoclopramide HCl - 5 MG Oral Tablet;   Therapy: [id number] to Recorded 11. NexIUM 20 MG Oral Capsule Delayed Release;   Therapy: (Recorded:28Apr2014) to Recorded 12. Testim 50 MG/5GM (1%) Transdermal Gel; Apply 1 each AM;   Therapy: [id number] to (Last Rx:26May2015) Ordered 13. Victoza 18 MG/3ML Subcutaneous Solution Pen-injector;   Therapy: [id number] to Recorded  Allergies Medication  1. CeleBREX CAPS 2. Hydrocodone-Acetaminophen CAPS 3. Sulfa Drugs  Family History Problems  1. Family history of Acute Myocardial Infarction (V17.3) : Father 2. Family history of Death In The Family Father 3. Family history of Diabetes Mellitus (V18.0) 4. Family history  of [place] ___ Living Daughters   3 5. Family history of Family Health Status Of Mother -  Alive 6. Family history of Nephrolithiasis : Father 91. Family history of Renal Failure : Father  Social History Problems  1. Alcohol Use   occasional/rare 2. Caffeine Use   4-5 pepsi 3. Current every day smoker (305.1) 4. Marital History - Currently Married 5. Tobacco use (305.1)   smokes 2 ppd for 30 years  Review of Systems Genitourinary, constitutional, skin, eye, otolaryngeal, hematologic/lymphatic, cardiovascular, pulmonary, endocrine, musculoskeletal, gastrointestinal, neurological and psychiatric system(s) were reviewed and pertinent findings if present are noted.  Genitourinary: feelings of urinary urgency, nocturia, difficulty starting the urinary stream, urinary stream starts and stops, incomplete emptying of bladder, erectile dysfunction, testicular pain and initiating urination requires straining.  Gastrointestinal: nausea and heartburn.  Constitutional: feeling tired (fatigue) and recent weight loss.  Integumentary: skin rash/lesion and pruritus.  Respiratory: shortness of breath.  Endocrine: polydipsia.  Musculoskeletal: back pain and joint pain.  Psychiatric: depression.    Vitals Height: 5 ft 11 in Weight: 225 lb  BMI Calculated: 31.38 BSA Calculated: 2.22 Blood Pressure: 150 / 87 Heart Rate: 88  Physical Exam Constitutional: Well nourished and well developed . No acute distress.  ENT:. The ears and nose are normal in appearance.  Neck: The appearance of the neck is normal and no neck mass is present.  Pulmonary: No respiratory distress and normal respiratory rhythm and effort.  Cardiovascular: Heart rate and rhythm are normal . No peripheral edema.  Abdomen: The abdomen is soft and nontender. No masses are palpated. No CVA tenderness. No hernias are palpable. No hepatosplenomegaly noted.  Rectal: Rectal exam demonstrates normal sphincter tone, no tenderness and no masses. Estimated prostate size is 1+. His prostate is asymmetric with the right lobe larger  than the left but smooth and nonindurated. The prostate has no nodularity and is not tender. The left seminal vesicle is nonpalpable. The right seminal vesicle is nonpalpable. The perineum is normal on inspection.  Genitourinary: Examination of the penis demonstrates no discharge, no masses, no lesions and a normal meatus. The penis is circumcised. The scrotum is without lesions. The right epididymis is palpably normal and non-tender. The left epididymis is palpably normal and non-tender. The right testis is non-tender and without masses. The left testis is non-tender and without masses.  Lymphatics: The femoral and inguinal nodes are not enlarged or tender.  Skin: Normal skin turgor, no visible rash and no visible skin lesions.     Procedure: Cystoscopy done 12/04/13  Indication: History of Urothelial Carcinoma.  Informed Consent: Risks, benefits, and potential adverse events were discussed and informed consent was obtained from the patient.  Prep: The patient was prepped with betadine.  Anesthesia:. Local anesthesia was administered intraurethrally with 2% lidocaine jelly.  Procedure Note:  Urethral meatus:. No abnormalities.  Anterior urethra: No abnormalities.  Prostatic urethra:. Papillary lesion was noted just inside the bladder neck and the prostatic urethra anteriorly on the right hand side. A low.  Bladder: Visulization was clear. The ureteral orifices were in the normal anatomic position bilaterally and had clear efflux of urine. A systematic survey of the bladder demonstrated no bladder tumors or stones. The mucosa was smooth without abnormalities. The patient tolerated the procedure well.  Complications: None.    Assessment   He has developed a recurrence just inside the bladder neck in the prostatic urethra anteriorly on the right hand side. This may be in part what  is causing some of his hesitancy that he has described. We discussed the need to remove this surgically with the hope  that there has not been any invasion into the prostate.  Plan He'll be scheduled for transurethral resection of his prostatic urethral recurrence.

## 2014-01-02 NOTE — Discharge Instructions (Signed)

## 2014-01-02 NOTE — Progress Notes (Signed)
Dr. Delma Post paged earlier, on floor reported glucose and lab work,

## 2014-01-05 LAB — POCT I-STAT, CHEM 8
BUN: 18 mg/dL (ref 6–23)
Calcium, Ion: 1.16 mmol/L (ref 1.12–1.23)
Chloride: 99 mEq/L (ref 96–112)
Creatinine, Ser: 1.1 mg/dL (ref 0.50–1.35)
Glucose, Bld: 638 mg/dL (ref 70–99)
HCT: 50 % (ref 39.0–52.0)
Hemoglobin: 17 g/dL (ref 13.0–17.0)
Potassium: 4.4 mEq/L (ref 3.7–5.3)
Sodium: 132 mEq/L — ABNORMAL LOW (ref 137–147)
TCO2: 21 mmol/L (ref 0–100)

## 2014-01-19 ENCOUNTER — Other Ambulatory Visit: Payer: Self-pay | Admitting: Family Medicine

## 2014-01-19 DIAGNOSIS — Z01818 Encounter for other preprocedural examination: Secondary | ICD-10-CM

## 2014-01-19 DIAGNOSIS — R410 Disorientation, unspecified: Secondary | ICD-10-CM

## 2014-01-19 DIAGNOSIS — Z8673 Personal history of transient ischemic attack (TIA), and cerebral infarction without residual deficits: Secondary | ICD-10-CM

## 2014-01-19 NOTE — Discharge Instructions (Signed)

## 2014-01-23 ENCOUNTER — Ambulatory Visit (HOSPITAL_BASED_OUTPATIENT_CLINIC_OR_DEPARTMENT_OTHER): Admission: RE | Admit: 2014-01-23 | Payer: 59 | Source: Ambulatory Visit | Admitting: Urology

## 2014-01-23 ENCOUNTER — Encounter (HOSPITAL_BASED_OUTPATIENT_CLINIC_OR_DEPARTMENT_OTHER): Admission: RE | Payer: Self-pay | Source: Ambulatory Visit

## 2014-01-23 SURGERY — TRANSURETHRAL RESECTION OF BLADDER TUMOR WITH GYRUS (TURBT-GYRUS)
Anesthesia: General

## 2014-01-27 ENCOUNTER — Ambulatory Visit
Admission: RE | Admit: 2014-01-27 | Discharge: 2014-01-27 | Disposition: A | Discharge status: Home / Self Care | Payer: 59 | Source: Ambulatory Visit | Attending: Family Medicine | Admitting: Family Medicine

## 2014-01-27 ENCOUNTER — Other Ambulatory Visit: Payer: 59

## 2014-01-27 DIAGNOSIS — R410 Disorientation, unspecified: Secondary | ICD-10-CM

## 2014-01-27 DIAGNOSIS — Z8673 Personal history of transient ischemic attack (TIA), and cerebral infarction without residual deficits: Secondary | ICD-10-CM

## 2014-01-27 DIAGNOSIS — Z01818 Encounter for other preprocedural examination: Secondary | ICD-10-CM

## 2014-01-30 ENCOUNTER — Ambulatory Visit
Admission: RE | Admit: 2014-01-30 | Discharge: 2014-01-30 | Disposition: A | Discharge status: Home / Self Care | Payer: 59 | Source: Ambulatory Visit | Attending: Family Medicine | Admitting: Family Medicine

## 2014-01-30 DIAGNOSIS — Z8673 Personal history of transient ischemic attack (TIA), and cerebral infarction without residual deficits: Secondary | ICD-10-CM

## 2014-01-30 DIAGNOSIS — Z01818 Encounter for other preprocedural examination: Secondary | ICD-10-CM

## 2014-02-11 ENCOUNTER — Other Ambulatory Visit: Payer: Self-pay | Admitting: Urology

## 2014-02-23 ENCOUNTER — Encounter (HOSPITAL_BASED_OUTPATIENT_CLINIC_OR_DEPARTMENT_OTHER): Payer: Self-pay | Admitting: *Deleted

## 2014-02-24 ENCOUNTER — Encounter (HOSPITAL_BASED_OUTPATIENT_CLINIC_OR_DEPARTMENT_OTHER): Payer: Self-pay | Admitting: *Deleted

## 2014-02-24 NOTE — Progress Notes (Signed)
NPO AFTER MN. ARRIVE AT 0600. NEED ISTAT. CURRENT EKG IN CHART AND EPIC. WILL TAKE AM MEDS WITH SIPS OF WATER DOS  EXCEPTION NO METFORMIN OR INSULIN.

## 2014-02-24 NOTE — Progress Notes (Signed)
   02/24/14 1314  OBSTRUCTIVE SLEEP APNEA  Have you ever been diagnosed with sleep apnea through a sleep study? No  Do you snore loudly (loud enough to be heard through closed doors)?  1  Do you often feel tired, fatigued, or sleepy during the daytime? 0  Has anyone observed you stop breathing during your sleep? 0  Do you have, or are you being treated for high blood pressure? 1  BMI more than 35 kg/m2? 0  Age over 46 years old? 1  Neck circumference greater than 40 cm/16 inches? 1  Gender: 1  Obstructive Sleep Apnea Score 5  Score 4 or greater  Results sent to PCP

## 2014-02-26 NOTE — Anesthesia Preprocedure Evaluation (Addendum)
Anesthesia Evaluation  Patient identified by MRN, date of birth, ID band Patient awake    Reviewed: Allergy & Precautions, H&P , NPO status , Patient's Chart, lab work & pertinent test results  History of Anesthesia Complications Negative for: history of anesthetic complications  Airway Mallampati: III  TM Distance: >3 FB Neck ROM: Full  Mouth opening: Limited Mouth Opening  Dental no notable dental hx. (+) Dental Advisory Given, Edentulous Upper, Partial Upper   Pulmonary COPD COPD inhaler, Current Smoker,  breath sounds clear to auscultation  Pulmonary exam normal       Cardiovascular Exercise Tolerance: Good negative cardio ROS  Rhythm:Regular Rate:Normal     Neuro/Psych TIAnegative psych ROS   GI/Hepatic Neg liver ROS, hiatal hernia, GERD-  Medicated and Controlled,  Endo/Other  diabetes, Poorly Controlled, Type 2, Insulin DependentHypothyroidism   Renal/GU negative Renal ROS  negative genitourinary   Musculoskeletal negative musculoskeletal ROS (+)   Abdominal   Peds negative pediatric ROS (+)  Hematology negative hematology ROS (+)   Anesthesia Other Findings   Reproductive/Obstetrics negative OB ROS                            Anesthesia Physical Anesthesia Plan  ASA: III  Anesthesia Plan: General   Post-op Pain Management:    Induction: Intravenous  Airway Management Planned: LMA  Additional Equipment:   Intra-op Plan:   Post-operative Plan: Extubation in OR  Informed Consent: I have reviewed the patients History and Physical, chart, labs and discussed the procedure including the risks, benefits and alternatives for the proposed anesthesia with the patient or authorized representative who has indicated his/her understanding and acceptance.   Dental advisory given  Plan Discussed with: CRNA  Anesthesia Plan Comments:        Anesthesia Quick Evaluation

## 2014-02-27 ENCOUNTER — Encounter (HOSPITAL_BASED_OUTPATIENT_CLINIC_OR_DEPARTMENT_OTHER)
Admission: RE | Disposition: A | Discharge status: Home / Self Care | Payer: Self-pay | Source: Ambulatory Visit | Attending: Urology

## 2014-02-27 ENCOUNTER — Encounter (HOSPITAL_BASED_OUTPATIENT_CLINIC_OR_DEPARTMENT_OTHER): Payer: Self-pay | Admitting: *Deleted

## 2014-02-27 ENCOUNTER — Ambulatory Visit (HOSPITAL_BASED_OUTPATIENT_CLINIC_OR_DEPARTMENT_OTHER)
Admission: RE | Admit: 2014-02-27 | Discharge: 2014-02-27 | Disposition: A | Discharge status: Home / Self Care | Payer: 59 | Source: Ambulatory Visit | Attending: Urology | Admitting: Urology

## 2014-02-27 ENCOUNTER — Ambulatory Visit (HOSPITAL_BASED_OUTPATIENT_CLINIC_OR_DEPARTMENT_OTHER): Payer: 59 | Admitting: Anesthesiology

## 2014-02-27 DIAGNOSIS — N508 Other specified disorders of male genital organs: Secondary | ICD-10-CM | POA: Diagnosis present

## 2014-02-27 DIAGNOSIS — D494 Neoplasm of unspecified behavior of bladder: Secondary | ICD-10-CM

## 2014-02-27 DIAGNOSIS — I10 Essential (primary) hypertension: Secondary | ICD-10-CM | POA: Diagnosis not present

## 2014-02-27 DIAGNOSIS — E291 Testicular hypofunction: Secondary | ICD-10-CM | POA: Insufficient documentation

## 2014-02-27 DIAGNOSIS — E780 Pure hypercholesterolemia: Secondary | ICD-10-CM | POA: Diagnosis not present

## 2014-02-27 DIAGNOSIS — Z8249 Family history of ischemic heart disease and other diseases of the circulatory system: Secondary | ICD-10-CM | POA: Diagnosis not present

## 2014-02-27 DIAGNOSIS — N3080 Other cystitis without hematuria: Secondary | ICD-10-CM | POA: Insufficient documentation

## 2014-02-27 DIAGNOSIS — Z8673 Personal history of transient ischemic attack (TIA), and cerebral infarction without residual deficits: Secondary | ICD-10-CM | POA: Diagnosis not present

## 2014-02-27 DIAGNOSIS — E039 Hypothyroidism, unspecified: Secondary | ICD-10-CM | POA: Insufficient documentation

## 2014-02-27 DIAGNOSIS — E119 Type 2 diabetes mellitus without complications: Secondary | ICD-10-CM | POA: Diagnosis not present

## 2014-02-27 DIAGNOSIS — F329 Major depressive disorder, single episode, unspecified: Secondary | ICD-10-CM | POA: Insufficient documentation

## 2014-02-27 DIAGNOSIS — Z841 Family history of disorders of kidney and ureter: Secondary | ICD-10-CM | POA: Diagnosis not present

## 2014-02-27 DIAGNOSIS — K259 Gastric ulcer, unspecified as acute or chronic, without hemorrhage or perforation: Secondary | ICD-10-CM | POA: Diagnosis not present

## 2014-02-27 DIAGNOSIS — Z8551 Personal history of malignant neoplasm of bladder: Secondary | ICD-10-CM | POA: Insufficient documentation

## 2014-02-27 DIAGNOSIS — K219 Gastro-esophageal reflux disease without esophagitis: Secondary | ICD-10-CM | POA: Insufficient documentation

## 2014-02-27 DIAGNOSIS — Z833 Family history of diabetes mellitus: Secondary | ICD-10-CM | POA: Diagnosis not present

## 2014-02-27 DIAGNOSIS — F419 Anxiety disorder, unspecified: Secondary | ICD-10-CM | POA: Diagnosis not present

## 2014-02-27 DIAGNOSIS — F17200 Nicotine dependence, unspecified, uncomplicated: Secondary | ICD-10-CM | POA: Insufficient documentation

## 2014-02-27 DIAGNOSIS — Z79899 Other long term (current) drug therapy: Secondary | ICD-10-CM | POA: Insufficient documentation

## 2014-02-27 DIAGNOSIS — J449 Chronic obstructive pulmonary disease, unspecified: Secondary | ICD-10-CM | POA: Insufficient documentation

## 2014-02-27 DIAGNOSIS — R631 Polydipsia: Secondary | ICD-10-CM | POA: Diagnosis not present

## 2014-02-27 DIAGNOSIS — Z794 Long term (current) use of insulin: Secondary | ICD-10-CM | POA: Insufficient documentation

## 2014-02-27 DIAGNOSIS — F17219 Nicotine dependence, cigarettes, with unspecified nicotine-induced disorders: Secondary | ICD-10-CM | POA: Diagnosis not present

## 2014-02-27 HISTORY — PX: TRANSURETHRAL RESECTION OF BLADDER TUMOR WITH GYRUS (TURBT-GYRUS): SHX6458

## 2014-02-27 LAB — GLUCOSE, CAPILLARY
Glucose-Capillary: 102 mg/dL — ABNORMAL HIGH (ref 70–99)
Glucose-Capillary: 63 mg/dL — ABNORMAL LOW (ref 70–99)
Glucose-Capillary: 131 mg/dL — ABNORMAL HIGH (ref 70–99)

## 2014-02-27 LAB — POCT I-STAT 4, (NA,K, GLUC, HGB,HCT)
Glucose, Bld: 130 mg/dL — ABNORMAL HIGH (ref 70–99)
HCT: 36 % — ABNORMAL LOW (ref 39.0–52.0)
Hemoglobin: 12.2 g/dL — ABNORMAL LOW (ref 13.0–17.0)
Potassium: 4.2 mEq/L (ref 3.7–5.3)
Sodium: 141 mEq/L (ref 137–147)

## 2014-02-27 SURGERY — TRANSURETHRAL RESECTION OF BLADDER TUMOR WITH GYRUS (TURBT-GYRUS)
Anesthesia: General | Site: Bladder

## 2014-02-27 MED ORDER — PROPOFOL 10 MG/ML IV BOLUS
INTRAVENOUS | Status: DC | PRN
  Administered 2014-02-27: 250 mg via INTRAVENOUS
  Administered 2014-02-27: 50 mg via INTRAVENOUS

## 2014-02-27 MED ORDER — MIDAZOLAM HCL 5 MG/5ML IJ SOLN
INTRAMUSCULAR | Status: DC | PRN
  Administered 2014-02-27 (×2): 1 mg via INTRAVENOUS

## 2014-02-27 MED ORDER — MIDAZOLAM HCL 2 MG/2ML IJ SOLN
INTRAMUSCULAR | Status: AC
  Filled 2014-02-27: qty 2

## 2014-02-27 MED ORDER — CIPROFLOXACIN IN D5W 200 MG/100ML IV SOLN
INTRAVENOUS | Status: AC
  Filled 2014-02-27: qty 100

## 2014-02-27 MED ORDER — KETOROLAC TROMETHAMINE 30 MG/ML IJ SOLN
INTRAMUSCULAR | Status: DC | PRN
  Administered 2014-02-27: 30 mg via INTRAVENOUS

## 2014-02-27 MED ORDER — LIDOCAINE HCL (CARDIAC) 20 MG/ML IV SOLN
INTRAVENOUS | Status: DC | PRN
  Administered 2014-02-27: 100 mg via INTRAVENOUS

## 2014-02-27 MED ORDER — SODIUM CHLORIDE 0.9 % IN NEBU
INHALATION_SOLUTION | RESPIRATORY_TRACT | Status: AC
  Filled 2014-02-27: qty 3

## 2014-02-27 MED ORDER — SODIUM CHLORIDE 0.9 % IR SOLN
Status: DC | PRN
  Administered 2014-02-27: 500 mL
  Administered 2014-02-27 (×2): 3000 mL

## 2014-02-27 MED ORDER — FENTANYL CITRATE 0.05 MG/ML IJ SOLN
INTRAMUSCULAR | Status: AC
  Filled 2014-02-27: qty 4

## 2014-02-27 MED ORDER — FENTANYL CITRATE 0.05 MG/ML IJ SOLN
INTRAMUSCULAR | Status: DC | PRN
  Administered 2014-02-27: 50 ug via INTRAVENOUS
  Administered 2014-02-27 (×6): 25 ug via INTRAVENOUS

## 2014-02-27 MED ORDER — FENTANYL CITRATE 0.05 MG/ML IJ SOLN
25.0000 ug | INTRAMUSCULAR | Status: DC | PRN
  Filled 2014-02-27: qty 1

## 2014-02-27 MED ORDER — ONDANSETRON HCL 4 MG/2ML IJ SOLN
INTRAMUSCULAR | Status: DC | PRN
  Administered 2014-02-27: 4 mg via INTRAVENOUS

## 2014-02-27 MED ORDER — ONDANSETRON HCL 4 MG/2ML IJ SOLN
4.0000 mg | Freq: Once | INTRAMUSCULAR | Status: DC | PRN
  Filled 2014-02-27: qty 2

## 2014-02-27 MED ORDER — ALBUTEROL SULFATE (2.5 MG/3ML) 0.083% IN NEBU
2.5000 mg | INHALATION_SOLUTION | Freq: Four times a day (QID) | RESPIRATORY_TRACT | Status: AC
  Administered 2014-02-27: 2.5 mg via RESPIRATORY_TRACT
  Filled 2014-02-27: qty 3

## 2014-02-27 MED ORDER — CIPROFLOXACIN IN D5W 200 MG/100ML IV SOLN
200.0000 mg | INTRAVENOUS | Status: AC
  Administered 2014-02-27: 200 mg via INTRAVENOUS
  Filled 2014-02-27: qty 100

## 2014-02-27 MED ORDER — LACTATED RINGERS IV SOLN
INTRAVENOUS | Status: DC | PRN
  Administered 2014-02-27 (×2): via INTRAVENOUS

## 2014-02-27 MED ORDER — ALBUTEROL SULFATE (2.5 MG/3ML) 0.083% IN NEBU
INHALATION_SOLUTION | RESPIRATORY_TRACT | Status: AC
Start: 2014-02-27 — End: 2014-02-27
  Filled 2014-02-27: qty 3

## 2014-02-27 MED ORDER — LACTATED RINGERS IV SOLN
INTRAVENOUS | Status: DC
  Administered 2014-02-27 (×3): via INTRAVENOUS
  Filled 2014-02-27: qty 1000

## 2014-02-27 MED ORDER — PHENAZOPYRIDINE HCL 200 MG PO TABS: 200.0000 mg | ORAL_TABLET | Freq: Three times a day (TID) | ORAL | Status: DC | PRN

## 2014-02-27 MED ORDER — TRAMADOL HCL 50 MG PO TABS: 50.0000 mg | ORAL_TABLET | Freq: Four times a day (QID) | ORAL | Status: DC | PRN

## 2014-02-27 MED ORDER — ACETAMINOPHEN 10 MG/ML IV SOLN
INTRAVENOUS | Status: DC | PRN
  Administered 2014-02-27: 1000 mg via INTRAVENOUS

## 2014-02-27 SURGICAL SUPPLY — 36 items
BAG DRAIN URO-CYSTO SKYTR STRL (DRAIN) ×2 IMPLANT
BAG DRN ANRFLXCHMBR STRAP LEK (BAG)
BAG DRN UROCATH (DRAIN) ×1
BAG URINE DRAINAGE (UROLOGICAL SUPPLIES) IMPLANT
BAG URINE LEG 19OZ MD ST LTX (BAG) IMPLANT
CANISTER SUCT LVC 12 LTR MEDI- (MISCELLANEOUS) ×2 IMPLANT
CATH FOLEY 3WAY 30CC 24FR (CATHETERS) ×2
CATH HEMA 3WAY 30CC 24FR COUDE (CATHETERS) IMPLANT
CATH HEMA 3WAY 30CC 24FR RND (CATHETERS) IMPLANT
CATH URTH STD 24FR FL 3W 2 (CATHETERS) ×1 IMPLANT
CLOTH BEACON ORANGE TIMEOUT ST (SAFETY) ×2 IMPLANT
DRAPE CAMERA CLOSED 9X96 (DRAPES) ×2 IMPLANT
ELECT BUTTON BIOP 24F 90D PLAS (MISCELLANEOUS) IMPLANT
ELECT BUTTON HF 24-28F 2 30DE (ELECTRODE) IMPLANT
ELECT LOOP MED HF 24F 12D CBL (CLIP) ×2 IMPLANT
ELECT REM PT RETURN 9FT ADLT (ELECTROSURGICAL)
ELECT RESECT VAPORIZE 12D CBL (ELECTRODE) ×1 IMPLANT
ELECTRODE REM PT RTRN 9FT ADLT (ELECTROSURGICAL) ×1 IMPLANT
EVACUATOR MICROVAS BLADDER (UROLOGICAL SUPPLIES) ×1 IMPLANT
GLOVE BIO SURGEON STRL SZ 6.5 (GLOVE) ×1 IMPLANT
GLOVE BIO SURGEON STRL SZ8 (GLOVE) ×2 IMPLANT
GLOVE BIOGEL PI IND STRL 6.5 (GLOVE) IMPLANT
GLOVE BIOGEL PI INDICATOR 6.5 (GLOVE) ×2
GOWN STRL REUS W/ TWL LRG LVL3 (GOWN DISPOSABLE) IMPLANT
GOWN STRL REUS W/ TWL XL LVL3 (GOWN DISPOSABLE) IMPLANT
GOWN STRL REUS W/TWL LRG LVL3 (GOWN DISPOSABLE) ×2
GOWN STRL REUS W/TWL XL LVL3 (GOWN DISPOSABLE) ×2
HOLDER FOLEY CATH W/STRAP (MISCELLANEOUS) IMPLANT
IV NS IRRIG 3000ML ARTHROMATIC (IV SOLUTION) ×2 IMPLANT
NS IRRIG 500ML POUR BTL (IV SOLUTION) ×1 IMPLANT
PACK CYSTO (CUSTOM PROCEDURE TRAY) ×2 IMPLANT
PLUG CATH AND CAP STER (CATHETERS) IMPLANT
SET ASPIRATION TUBING (TUBING) ×2 IMPLANT
SYR 30ML LL (SYRINGE) IMPLANT
SYRINGE IRR TOOMEY STRL 70CC (SYRINGE) IMPLANT
WATER STERILE IRR 3000ML UROMA (IV SOLUTION) IMPLANT

## 2014-02-27 NOTE — Anesthesia Postprocedure Evaluation (Signed)
  Anesthesia Post-op Note  Patient: Vincent [name]  Procedure(s) Performed: Procedure(s) (LRB): TRANSURETHRAL RESECTION OF BLADDER TUMOR WITH GYRUS (TURBT-GYRUS) (N/A)  Patient Location: PACU  Anesthesia Type: General  Level of Consciousness: awake and alert   Airway and Oxygen Therapy: Patient Spontanous Breathing  Post-op Pain: mild  Post-op Assessment: Post-op Vital signs reviewed, Patient's Cardiovascular Status Stable, Respiratory Function Stable, Patent Airway and No signs of Nausea or vomiting  Last Vitals:  Filed Vitals:   02/27/14 0830  BP: 108/61  Pulse: 94  Temp:   Resp: 11    Post-op Vital Signs: stable   Complications: No apparent anesthesia complications

## 2014-02-27 NOTE — Discharge Instructions (Signed)
Transurethral Resection of Bladder Tumor (TURBT)   Definition:  Transurethral Resection of the Bladder Tumor is a surgical procedure used to diagnose and remove tumors within the bladder. TURBT is the most common treatment for early stage bladder cancer.  General instructions:     Your recent bladder surgery requires very little post hospital care but some definite precautions.  Despite the fact that no skin incisions were used, the area around the bladder incisions are raw and covered with scabs to promote healing and prevent bleeding. Certain precautions are needed to insure that the scabs are not disturbed over the next 2-4 weeks while the healing proceeds.  Because the raw surface inside your bladder and the irritating effects of urine you may expect frequency of urination and/or urgency (a stronger desire to urinate) and perhaps even getting up at night more often. This will usually resolve or improve slowly over the healing period. You may see some blood in your urine over the first 6 weeks. Do not be alarmed, even if the urine was clear for a while. Get off your feet and drink lots of fluids until clearing occurs. If you start to pass clots or don't improve call us.  Catheter: (If you are discharged with a catheter.)  1. Keep your catheter secured to your leg at all times with tape or the supplied strap. 2. You may experience leakage of urine around your catheter- as long as the  catheter continues to drain, this is normal.  If your catheter stops draining  go to the ER. 3. You may also have blood in your urine, even after it has been clear for  several days; you may even pass some small blood clots or other material.  This  is normal as well.  If this happens, sit down and drink plenty of water to help  make urine to flush out your bladder.  If the blood in your urine becomes worse  after doing this, contact our office or return to the ER. 4. You may use the leg bag (small bag)  during the day, but use the large bag at  night.  Diet:  You may return to your normal diet immediately. Because of the raw surface of your bladder, alcohol, spicy foods, foods high in acid and drinks with caffeine may cause irritation or frequency and should be used in moderation. To keep your urine flowing freely and avoid constipation, drink plenty of fluids during the day (8-10 glasses). Tip: Avoid cranberry juice because it is very acidic.  Activity:  Your physical activity doesn't need to be restricted. However, if you are very active, you may see some blood in the urine. We suggest that you reduce your activity under the circumstances until the bleeding has stopped.  Bowels:  It is important to keep your bowels regular during the postoperative period. Straining with bowel movements can cause bleeding. A bowel movement every other day is reasonable. Use a mild laxative if needed, such as milk of magnesia 2-3 tablespoons, or 2 Dulcolax tablets. Call if you continue to have problems. If you had been taking narcotics for pain, before, during or after your surgery, you may be constipated. Take a laxative if necessary.    Medication:  You should resume your pre-surgery medications unless told not to. In addition you may be given an antibiotic to prevent or treat infection. Antibiotics are not always necessary. All medication should be taken as prescribed until the bottles are finished unless you are having  an unusual reaction to one of the drugs.    Post Anesthesia Home Care Instructions  Activity: Get plenty of rest for the remainder of the day. A responsible adult should stay with you for 24 hours following the procedure.  For the next 24 hours, DO NOT: -Drive a car -Operate machinery -Drink alcoholic beverages -Take any medication unless instructed by your physician -Make any legal decisions or sign important papers.  Meals: Start with liquid foods such as gelatin or soup.  Progress to regular foods as tolerated. Avoid greasy, spicy, heavy foods. If nausea and/or vomiting occur, drink only clear liquids until the nausea and/or vomiting subsides. Call your physician if vomiting continues.  Special Instructions/Symptoms: Your throat may feel dry or sore from the anesthesia or the breathing tube placed in your throat during surgery. If this causes discomfort, gargle with warm salt water. The discomfort should disappear within 24 hours.        

## 2014-02-27 NOTE — H&P (Signed)
History of Present Illness     History of phimosis: He developed phimosis secondary to diabetes in 10/03 and underwent a circumcision in 2/05.    Transitional cell carcinoma of the bladder: He was found to have 7-10 rbc's/hpf. He has a 60-pack-year smoking history. He was found to have a papillary tumor on the left posterior wall of his bladder and underwent TURBT with postoperative mitomycin-C on 08/09/12.  Pathology: Papillary transitional cell carcinoma (Ta,G1)    Prostatic asymmetry: He was found on DRE to have some right lobe enlargement relative to the left however there were no worrisome findings and his PSA in 4/14 was 0.47.    LUTS: He noted the acute onset of hesitancy and intermittency with some associated suprapubic tenderness and intermittent bilateral testicular pain without associated dysuria.    Primary hypogonadism: He was experiencing a decreased libido and diminished erectile quality. He was found to have a serum of 167. Further evaluation revealed a normal FSH, LH and prolactin indicating primary hypogonadism.   Previously tried: AndroGel 2 pumps & Testim  Current therapy: Testosterone injection    Interval history: He reports he's developed some hesitancy but denies any hematuria or dysuria. He is no longer using Testim and reports that he is receiving testosterone injections from his primary care physician. The dosage that he tells me he is receiving is 100 mg every 3 weeks. It's been 3 weeks since his last injection. His wife says it makes him "mean".         Past Medical History Problems  1. History of Anxiety (300.00) 2. Carcinoma of bladder (188.9) 3. History of Diabetic Ketoacidosis Type 2 (250.10) 4. History of Gastric ulcer (531.90) 5. History of depression (V11.8) 6. History of diabetes mellitus (V12.29) 7. History of esophageal reflux (V12.79) 8. History of hypercholesterolemia (V12.29) 9. History of hypertension (V12.59) 10. History of  hypothyroidism (V12.29) 11. History of Ischemic Stroke (434.90)  Surgical History Problems  1. History of Bladder Injection Of Cancer Treatment 2. History of Cholecystectomy 3. History of Cystoscopy With Fulguration Medium Lesion (2-5cm) 4. History of Elective Circumcision 5. History of Rotator Cuff Repair 6. History of Wrist Arthroscopy With Release Of Transverse Carpal Ligament  Current Meds 1. Amitriptyline HCl TABS;  Therapy: (Recorded:24Sep2015) to Recorded 2. Atorvastatin Calcium 10 MG Oral Tablet;  Therapy: 30SPQ3300 to Recorded 3. Duragesic-100 100 MCG/HR Transdermal Patch 72 Hour;  Therapy: (Recorded:26May2015) to Recorded 4. FLUoxetine HCl - 40 MG Oral Capsule;  Therapy: 27Mar2013 to Recorded 5. Gabapentin 600 MG Oral Tablet;  Therapy: 76AUQ3335 to Recorded 6. HumaLOG Mix 75/25 KwikPen (75-25) 100 UNIT/ML Subcutaneous Suspension  Pen-injector;  Therapy: 45GYB6389 to Recorded 7. HYDROmorphone HCl - 2 MG Oral Tablet;  Therapy: 37DSK8768 to Recorded 8. Levothyroxine Sodium 25 MCG Oral Tablet;  Therapy: 11XBW6203 to Recorded 9. MetFORMIN HCl - 1000 MG Oral Tablet;  Therapy: 55HRC1638 to Recorded 10. Metoclopramide HCl - 5 MG Oral Tablet;   Therapy: [id number] to Recorded 11. NexIUM 20 MG Oral Capsule Delayed Release;   Therapy: (Recorded:28Apr2014) to Recorded 12. Testim 50 MG/5GM (1%) Transdermal Gel; Apply 1 each AM;   Therapy: 21YYQ8250 to (Last Rx:26May2015) Ordered 13. Victoza 18 MG/3ML Subcutaneous Solution Pen-injector;   Therapy: 03BCW8889 to Recorded  Allergies Medication  1. CeleBREX CAPS 2. Hydrocodone-Acetaminophen CAPS 3. Sulfa Drugs  Family History Problems  1. Family history of Acute Myocardial Infarction (V17.3) : Father 2. Family history of Death In The Family Father 3. Family history of Diabetes Mellitus (V18.0) 4. Family history  of [place] ___ Living Daughters   3 5. Family history of Family Health Status Of Mother -  Alive 6. Family history of Nephrolithiasis : Father 110. Family history of Renal Failure : Father  Social History Problems  1. Alcohol Use   occasional/rare 2. Caffeine Use   4-5 pepsi 3. Current every day smoker (305.1) 4. Marital History - Currently Married 5. Tobacco use (305.1)   smokes 2 ppd for 30 years     Vitals Vital Signs  Height: 5 ft 11 in Weight: 225 lb  BMI Calculated: 31.38 BSA Calculated: 2.22 Blood Pressure: 150 / 87 Heart Rate: 88  Review of Systems Genitourinary, constitutional, skin, eye, otolaryngeal, hematologic/lymphatic, cardiovascular, pulmonary, endocrine, musculoskeletal, gastrointestinal, neurological and psychiatric system(s) were reviewed and pertinent findings if present are noted.  Genitourinary: feelings of urinary urgency, nocturia, difficulty starting the urinary stream, urinary stream starts and stops, incomplete emptying of bladder, erectile dysfunction, testicular pain and initiating urination requires straining.  Gastrointestinal: nausea and heartburn.  Constitutional: feeling tired (fatigue) and recent weight loss.  Integumentary: skin rash/lesion and pruritus.  Respiratory: shortness of breath.  Endocrine: polydipsia.  Musculoskeletal: back pain and joint pain.  Psychiatric: depression.    Physical Exam Constitutional: Well nourished and well developed . No acute distress.  ENT:. The ears and nose are normal in appearance.  Neck: The appearance of the neck is normal and no neck mass is present.  Pulmonary: No respiratory distress and normal respiratory rhythm and effort.  Cardiovascular: Heart rate and rhythm are normal . No peripheral edema.  Abdomen: The abdomen is soft and nontender. No masses are palpated. No CVA tenderness. No hernias are palpable. No hepatosplenomegaly noted.  Rectal: Rectal exam demonstrates normal sphincter tone, no tenderness and no masses. Estimated prostate size is 1+. His prostate is asymmetric with  the right lobe larger than the left but smooth and nonindurated. The prostate has no nodularity and is not tender. The left seminal vesicle is nonpalpable. The right seminal vesicle is nonpalpable. The perineum is normal on inspection.  Genitourinary: Examination of the penis demonstrates no discharge, no masses, no lesions and a normal meatus. The penis is circumcised. The scrotum is without lesions. The right epididymis is palpably normal and non-tender. The left epididymis is palpably normal and non-tender. The right testis is non-tender and without masses. The left testis is non-tender and without masses.  Lymphatics: The femoral and inguinal nodes are not enlarged or tender.  Skin: Normal skin turgor, no visible rash and no visible skin lesions.  Neuro/Psych:. Mood and affect are appropriate.    Results/Data  The following clinical lab reports were reviewed:  Labs as below.    Procedure  Procedure: Cystoscopy done on 12/04/13  Indication: History of Urothelial Carcinoma.  Informed Consent: Risks, benefits, and potential adverse events were discussed and informed consent was obtained from the patient.  Prep: The patient was prepped with betadine.  Anesthesia:. Local anesthesia was administered intraurethrally with 2% lidocaine jelly.  Procedure Note:  Urethral meatus:. No abnormalities.  Anterior urethra: No abnormalities.  Prostatic urethra:. Papillary lesion was noted just inside the bladder neck and the prostatic urethra anteriorly on the right hand side. A low.  Bladder: Visulization was clear. The ureteral orifices were in the normal anatomic position bilaterally and had clear efflux of urine. A systematic survey of the bladder demonstrated no bladder tumors or stones. The mucosa was smooth without abnormalities. The patient tolerated the procedure well.  Complications: None.  Assessment   He has developed a recurrence just inside the bladder neck in the prostatic urethra  anteriorly on the right hand side. This may be in part what is causing some of his hesitancy that he has described. We discussed the need to remove this surgically with the hope that there has not been any invasion into the prostate.   Plan  He'll be scheduled for transurethral resection of his prostatic urethral recurrence.

## 2014-02-27 NOTE — Progress Notes (Signed)
Inserted #16 fr foley catheter using sterile technique with immediate return of 2 small blood clots with 200 ml of brownish urine. Instructed wife and patient on catheter care and how to discontinue catheter on Monday morning.

## 2014-02-27 NOTE — Anesthesia Procedure Notes (Signed)
Procedure Name: LMA Insertion Date/Time: 02/27/2014 7:39 AM Performed by: Justice Rocher Pre-anesthesia Checklist: Patient identified, Emergency Drugs available, Suction available and Patient being monitored Patient Re-evaluated:Patient Re-evaluated prior to inductionOxygen Delivery Method: Circle System Utilized Preoxygenation: Pre-oxygenation with 100% oxygen Intubation Type: IV induction Ventilation: Mask ventilation without difficulty LMA: LMA inserted LMA Size: 5.0 Number of attempts: 1 Airway Equipment and Method: bite block Placement Confirmation: positive ETCO2 Tube secured with: Tape Dental Injury: Teeth and Oropharynx as per pre-operative assessment

## 2014-02-27 NOTE — Progress Notes (Signed)
Dr. Karsten Ro paged and called back informed that patient unable to void and scan results see orders.

## 2014-02-27 NOTE — Progress Notes (Signed)
Attempted to void x2, bladder scan 22 ml. Dr. Karsten Ro paged and called back. Informed Dr. Karsten Ro that patient unable to void and bladder scan 22 ml. Instructed by Dr. Karsten Ro to finish current bag of IV fluid and keep him updated as needed.

## 2014-02-27 NOTE — Op Note (Signed)
PATIENT:  Vincent [name]  PRE-OPERATIVE DIAGNOSIS: Bladder tumor  POST-OPERATIVE DIAGNOSIS: Same  PROCEDURE:  Procedure(s): TRANSURETHRAL RESECTION OF BLADDER TUMOR (TURBT) (1.5cm.)  SURGEON:  Surgeon(s): Claybon Jabs  ANESTHESIA:   General  EBL:  Minimal  DRAINS: None  SPECIMEN:  Source of Specimen:  Bladder tumor  DISPOSITION OF SPECIMEN:  PATHOLOGY  Indication:   Description of operation: The patient was taken to the operating room and administered general anesthesia. He was then placed on the table and moved to the dorsal lithotomy position after which his genitalia was sterilely prepped and draped. An official timeout was then performed.  The 40 French resectoscope with the 12 lens and visual obturator were then passed down the urethra under direct visualization. Urethra appeared normal. The tumor was identified just to the right of midline at the bladder neck. It appeared to actually partially occlude the bladder neck where the bladder neck and prostate joined. The visual obturator was then removed and the Gyrus resectoscope element with 12 lens was then inserted and the bladder was fully and systematically inspected. Ureteral orifices were noted to be in the normal anatomic positions.   After I had completely inspected the bladder I turned my attention back to the area where the bladder tumor was located and noted it was no longer present. I think what had happened was it was a superficial tumor and was avulsed with the resectoscope during my inspection of the bladder. Fortunately the tumor was photographed initially. I therefore resected the area where the tumor was located and fulgurated this. Reinspection of the bladder revealed all obvious tumor had been fully resected and there was no evidence of perforation. I removed the resectoscope and inserted the cystoscope using the 70 lens in order to visualize the area where the tumor had been located to be sure all of the tumor  had been resected and no further tumor was noted. No other abnormality was noted of the bladder mucosa using the 70 lens. The Microvasive evacuator was then used to irrigate the bladder and remove all of the portions of the bladder neck tissue which were resected and these were sent to pathology. I then removed the resectoscope.  The patient tolerated the procedure well no intraoperative complications.   PLAN OF CARE: Discharge to home after PACU  PATIENT DISPOSITION:  PACU - hemodynamically stable.

## 2014-02-27 NOTE — Transfer of Care (Signed)
Immediate Anesthesia Transfer of Care Note  Patient: Vincent [name]  Procedure(s) Performed: Procedure(s) (LRB): TRANSURETHRAL RESECTION OF BLADDER TUMOR WITH GYRUS (TURBT-GYRUS) (N/A)  Patient Location: PACU  Anesthesia Type: General  Level of Consciousness: awake, sedated, patient cooperative and responds to stimulation  Airway & Oxygen Therapy: Patient Spontanous Breathing and Patient connected to face mask oxygen  Post-op Assessment: Report given to PACU RN, Post -op Vital signs reviewed and stable and Patient moving all extremities  Post vital signs: Reviewed and stable  Complications: No apparent anesthesia complications

## 2014-03-02 ENCOUNTER — Encounter (HOSPITAL_BASED_OUTPATIENT_CLINIC_OR_DEPARTMENT_OTHER): Payer: Self-pay | Admitting: Urology

## 2014-03-04 ENCOUNTER — Encounter (HOSPITAL_COMMUNITY): Payer: Self-pay

## 2014-03-04 ENCOUNTER — Emergency Department (HOSPITAL_COMMUNITY): Payer: 59

## 2014-03-04 ENCOUNTER — Inpatient Hospital Stay (HOSPITAL_COMMUNITY)
Admission: EM | Admit: 2014-03-04 | Discharge: 2014-03-06 | DRG: 071 | Disposition: A | Discharge status: Home / Self Care | Payer: 59 | Attending: Internal Medicine | Admitting: Internal Medicine

## 2014-03-04 DIAGNOSIS — E039 Hypothyroidism, unspecified: Secondary | ICD-10-CM | POA: Diagnosis present

## 2014-03-04 DIAGNOSIS — E669 Obesity, unspecified: Secondary | ICD-10-CM | POA: Diagnosis present

## 2014-03-04 DIAGNOSIS — J449 Chronic obstructive pulmonary disease, unspecified: Secondary | ICD-10-CM | POA: Diagnosis present

## 2014-03-04 DIAGNOSIS — Z888 Allergy status to other drugs, medicaments and biological substances status: Secondary | ICD-10-CM

## 2014-03-04 DIAGNOSIS — Z8249 Family history of ischemic heart disease and other diseases of the circulatory system: Secondary | ICD-10-CM

## 2014-03-04 DIAGNOSIS — R410 Disorientation, unspecified: Secondary | ICD-10-CM | POA: Diagnosis not present

## 2014-03-04 DIAGNOSIS — E1140 Type 2 diabetes mellitus with diabetic neuropathy, unspecified: Secondary | ICD-10-CM | POA: Diagnosis present

## 2014-03-04 DIAGNOSIS — G9340 Encephalopathy, unspecified: Secondary | ICD-10-CM | POA: Diagnosis not present

## 2014-03-04 DIAGNOSIS — Z833 Family history of diabetes mellitus: Secondary | ICD-10-CM

## 2014-03-04 DIAGNOSIS — Z720 Tobacco use: Secondary | ICD-10-CM

## 2014-03-04 DIAGNOSIS — Z8673 Personal history of transient ischemic attack (TIA), and cerebral infarction without residual deficits: Secondary | ICD-10-CM

## 2014-03-04 DIAGNOSIS — D649 Anemia, unspecified: Secondary | ICD-10-CM | POA: Diagnosis present

## 2014-03-04 DIAGNOSIS — Z794 Long term (current) use of insulin: Secondary | ICD-10-CM

## 2014-03-04 DIAGNOSIS — R4182 Altered mental status, unspecified: Secondary | ICD-10-CM

## 2014-03-04 DIAGNOSIS — Z882 Allergy status to sulfonamides status: Secondary | ICD-10-CM

## 2014-03-04 DIAGNOSIS — E785 Hyperlipidemia, unspecified: Secondary | ICD-10-CM | POA: Diagnosis present

## 2014-03-04 DIAGNOSIS — N390 Urinary tract infection, site not specified: Secondary | ICD-10-CM | POA: Diagnosis present

## 2014-03-04 DIAGNOSIS — K219 Gastro-esophageal reflux disease without esophagitis: Secondary | ICD-10-CM | POA: Diagnosis present

## 2014-03-04 DIAGNOSIS — Z8551 Personal history of malignant neoplasm of bladder: Secondary | ICD-10-CM

## 2014-03-04 DIAGNOSIS — G8929 Other chronic pain: Secondary | ICD-10-CM | POA: Diagnosis present

## 2014-03-04 DIAGNOSIS — R451 Restlessness and agitation: Secondary | ICD-10-CM | POA: Diagnosis present

## 2014-03-04 LAB — CBC WITH DIFFERENTIAL/PLATELET
Basophils Absolute: 0.1 10*3/uL (ref 0.0–0.1)
Basophils Relative: 1 % (ref 0–1)
Eosinophils Absolute: 0.2 10*3/uL (ref 0.0–0.7)
Eosinophils Relative: 3 % (ref 0–5)
HCT: 37.7 % — ABNORMAL LOW (ref 39.0–52.0)
Hemoglobin: 12.9 g/dL — ABNORMAL LOW (ref 13.0–17.0)
Lymphocytes Relative: 45 % (ref 12–46)
Lymphs Abs: 3.1 10*3/uL (ref 0.7–4.0)
MCH: 28.5 pg (ref 26.0–34.0)
MCHC: 34.2 g/dL (ref 30.0–36.0)
MCV: 83.4 fL (ref 78.0–100.0)
Monocytes Absolute: 0.5 10*3/uL (ref 0.1–1.0)
Monocytes Relative: 7 % (ref 3–12)
Neutro Abs: 3 10*3/uL (ref 1.7–7.7)
Neutrophils Relative %: 44 % (ref 43–77)
Platelets: 235 10*3/uL (ref 150–400)
RBC: 4.52 MIL/uL (ref 4.22–5.81)
RDW: 12.9 % (ref 11.5–15.5)
WBC: 6.9 10*3/uL (ref 4.0–10.5)

## 2014-03-04 LAB — CBG MONITORING, ED: Glucose-Capillary: 150 mg/dL — ABNORMAL HIGH (ref 70–99)

## 2014-03-04 LAB — I-STAT ARTERIAL BLOOD GAS, ED
Bicarbonate: 24.9 mEq/L — ABNORMAL HIGH (ref 20.0–24.0)
O2 Saturation: 85 %
Patient temperature: 99.1
TCO2: 26 mmol/L (ref 0–100)
pCO2 arterial: 43.1 mmHg (ref 35.0–45.0)
pH, Arterial: 7.371 (ref 7.350–7.450)
pO2, Arterial: 53 mmHg — ABNORMAL LOW (ref 80.0–100.0)

## 2014-03-04 LAB — COMPREHENSIVE METABOLIC PANEL
ALT: 33 U/L (ref 0–53)
AST: 34 U/L (ref 0–37)
Albumin: 3.9 g/dL (ref 3.5–5.2)
Alkaline Phosphatase: 74 U/L (ref 39–117)
Anion gap: 7 (ref 5–15)
BUN: 13 mg/dL (ref 6–23)
CO2: 27 mmol/L (ref 19–32)
Calcium: 9.3 mg/dL (ref 8.4–10.5)
Chloride: 108 mEq/L (ref 96–112)
Creatinine, Ser: 0.99 mg/dL (ref 0.50–1.35)
GFR calc Af Amer: >90 mL/min (ref >90)
GFR calc non Af Amer: >90 mL/min (ref >90)
Glucose, Bld: 152 mg/dL — ABNORMAL HIGH (ref 70–99)
Potassium: 4.3 mmol/L (ref 3.5–5.1)
Sodium: 142 mmol/L (ref 135–145)
Total Bilirubin: 0.7 mg/dL (ref 0.3–1.2)
Total Protein: 6.3 g/dL (ref 6.0–8.3)

## 2014-03-04 LAB — RAPID URINE DRUG SCREEN, HOSP PERFORMED
Amphetamines: NOT DETECTED
Barbiturates: NOT DETECTED
Benzodiazepines: NOT DETECTED
Cocaine: NOT DETECTED
Opiates: NOT DETECTED
Tetrahydrocannabinol: NOT DETECTED

## 2014-03-04 LAB — URINALYSIS, ROUTINE W REFLEX MICROSCOPIC
Bilirubin Urine: NEGATIVE
Glucose, UA: NEGATIVE mg/dL
Ketones, ur: NEGATIVE mg/dL
Nitrite: NEGATIVE
Protein, ur: NEGATIVE mg/dL
Specific Gravity, Urine: 1.028 (ref 1.005–1.030)
Urobilinogen, UA: 1 mg/dL (ref 0.0–1.0)
pH: 5.5 (ref 5.0–8.0)

## 2014-03-04 LAB — I-STAT CG4 LACTIC ACID, ED: Lactic Acid, Venous: 2.65 mmol/L — ABNORMAL HIGH (ref 0.5–2.2)

## 2014-03-04 LAB — URINE MICROSCOPIC-ADD ON

## 2014-03-04 LAB — CK: Total CK: 415 U/L — ABNORMAL HIGH (ref 7–232)

## 2014-03-04 LAB — ETHANOL: Alcohol, Ethyl (B): <5 mg/dL (ref 0–9)

## 2014-03-04 LAB — ACETAMINOPHEN LEVEL: Acetaminophen (Tylenol), Serum: <10 ug/mL — ABNORMAL LOW (ref 10–30)

## 2014-03-04 LAB — SALICYLATE LEVEL: Salicylate Lvl: <4 mg/dL (ref 2.8–20.0)

## 2014-03-04 MED ORDER — SODIUM CHLORIDE 0.9 % IV BOLUS (SEPSIS)
1000.0000 mL | Freq: Once | INTRAVENOUS | Status: AC
  Administered 2014-03-05: 1000 mL via INTRAVENOUS

## 2014-03-04 MED ORDER — LORAZEPAM 2 MG/ML IJ SOLN
1.0000 mg | Freq: Once | INTRAMUSCULAR | Status: AC
  Administered 2014-03-04: 1 mg via INTRAVENOUS

## 2014-03-04 MED ORDER — NALOXONE HCL 0.4 MG/ML IJ SOLN
INTRAMUSCULAR | Status: AC
  Administered 2014-03-04: 0.4 mg via INTRAVENOUS
  Filled 2014-03-04: qty 1

## 2014-03-04 MED ORDER — LORAZEPAM 2 MG/ML IJ SOLN
INTRAMUSCULAR | Status: AC
  Filled 2014-03-04: qty 1

## 2014-03-04 MED ORDER — STERILE WATER FOR INJECTION IJ SOLN
INTRAMUSCULAR | Status: AC
  Administered 2014-03-04: 10 mL
  Filled 2014-03-04: qty 10

## 2014-03-04 MED ORDER — NALOXONE HCL 0.4 MG/ML IJ SOLN
0.4000 mg | Freq: Once | INTRAMUSCULAR | Status: AC
  Administered 2014-03-04: 0.4 mg via INTRAVENOUS

## 2014-03-04 MED ORDER — LORAZEPAM 2 MG/ML IJ SOLN
0.5000 mg | Freq: Once | INTRAMUSCULAR | Status: DC
  Filled 2014-03-04: qty 1

## 2014-03-04 MED ORDER — ZIPRASIDONE MESYLATE 20 MG IM SOLR
20.0000 mg | Freq: Once | INTRAMUSCULAR | Status: AC
  Administered 2014-03-04: 20 mg via INTRAMUSCULAR
  Filled 2014-03-04: qty 20

## 2014-03-04 NOTE — Progress Notes (Signed)
RN asked RT to come back at later time to draw ABG. RN will call RT when ready.

## 2014-03-04 NOTE — ED Notes (Signed)
MD at bedside. 

## 2014-03-04 NOTE — ED Provider Notes (Signed)
CSN: [id number]     Arrival date & time 03/04/14  2108 History   First MD Initiated Contact with Patient 03/04/14 2129     Chief Complaint  Patient presents with  . Altered Mental Status   LEVEL 5 CAVEAT -ALTERED MENTAL STATUS  Patient is a 48 y.o. male presenting with altered mental status. The history is provided by the spouse.  Altered Mental Status Presenting symptoms: combativeness and confusion   Severity:  Severe Most recent episode:  Today Episode history:  Single Timing:  Constant Progression:  Worsening Chronicity:  New patient presents from home for altered mental status that started within the past several hours Nothing has improved the altered mental status, nothing has worsened his altered mental status Per wife, pt had difficulty walking and appeared confused She reports he has had similar episodes in the past but none this severe No recent injuries He uses fentanyl patch and oral dilaudid as needed for pain but no other new medications He did undergo recent urologic procedure without any issue  No trauma is reported.  Past Medical History  Diagnosis Date  . COPD (chronic obstructive pulmonary disease)   . GERD (gastroesophageal reflux disease)   . Neuropathy in diabetes     LOWER EXTREMITIES  . Hypothyroidism   . History of TIA (transient ischemic attack) 2008--- NO RESIDUAL  . Diabetes mellitus, type 2   . H/O hiatal hernia   . History of kidney stones   . Urgency of urination   . Gastroparesis   . Hyperlipidemia   . History of gastric ulcer   . Chronic pain syndrome   . Bladder cancer   . Urinary hesitancy   . At risk for sleep apnea     STOP-BANG=  5       SENT TO PCP 02-24-2014   Past Surgical History  Procedure Laterality Date  . Rotator cuff repair Right 12/2004  . Carpal tunnel release Right 09-16-2003  . Laparoscopic cholecystectomy  11-17-2010  . Cardiac catheterization  12-27-2001  DR Einar Gip  &  05-26-2009  DR VARANASI    RESULTS FOR  BOTH ARE NORMAL CORONARIES AND PERSERVED LVF/ EF 60%  . Transurethral resection of bladder tumor N/A 08/09/2012    Procedure: TRANSURETHRAL RESECTION OF BLADDER TUMOR (TURBT) WITH GYRUS WITH MITOMYCIN C;  Surgeon: Claybon Jabs, MD;  Location: [facility];  Service: Urology;  Laterality: N/A;  . Cystoscopy N/A 10/10/2012    Procedure: CYSTOSCOPY CLOT EVACUATION FULGERATION OF BLEEDERS ;  Surgeon: Claybon Jabs, MD;  Location: [facility];  Service: Urology;  Laterality: N/A;  . Transurethral resection of bladder tumor with gyrus (turbt-gyrus) N/A 02/27/2014    Procedure: TRANSURETHRAL RESECTION OF BLADDER TUMOR WITH GYRUS (TURBT-GYRUS);  Surgeon: Claybon Jabs, MD;  Location: [facility];  Service: Urology;  Laterality: N/A;   Family History  Problem Relation Age of Onset  . Diabetes Mother   . Diabetes Father   . Hypertension Father   . Kidney disease Father    History  Substance Use Topics  . Smoking status: Current Every Day Smoker -- 2.00 packs/day for 20 years    Types: Cigarettes  . Smokeless tobacco: Never Used  . Alcohol Use: Yes     Comment: RARE    Review of Systems  Unable to perform ROS: Mental status change  Psychiatric/Behavioral: Positive for confusion.      Allergies  Celebrex; Hydrocodone; and Sulfa antibiotics  Home Medications  Prior to Admission medications   Medication Sig Start Date End Date Taking? Authorizing Provider  amitriptyline (ELAVIL) 100 MG tablet Take 100 mg by mouth at bedtime.    Historical Provider, MD  atorvastatin (LIPITOR) 40 MG tablet Take 40 mg by mouth every morning.     Historical Provider, MD  Coenzyme Q10 (CO Q 10) 10 MG CAPS Take 1 tablet by mouth daily.     Historical Provider, MD  esomeprazole (NEXIUM) 20 MG capsule Take 20 mg by mouth daily before breakfast.    Historical Provider, MD  fentaNYL (DURAGESIC - DOSED MCG/HR) 100 MCG/HR Place 100 mcg onto the skin every 3 (three) days.     Historical Provider, MD  fish oil-omega-3 fatty acids 1000 MG capsule Take 1,000-2,000 mg by mouth 2 (two) times daily. Take 1 capsule in the morning and 2 capsules in the evening    Historical Provider, MD  FLUoxetine (PROZAC) 40 MG capsule Take 40 mg by mouth 2 (two) times daily.     Historical Provider, MD  gabapentin (NEURONTIN) 600 MG tablet Take 600 mg by mouth 4 (four) times daily.    Historical Provider, MD  HYDROmorphone (DILAUDID) 2 MG tablet Take 2 mg by mouth every 4 (four) hours as needed for pain.    Historical Provider, MD  insulin aspart protamine- aspart (NOVOLOG MIX 70/30) (70-30) 100 UNIT/ML injection Inject 20-60 Units into the skin 3 (three) times daily. Takes 60 units with breakfast, 20 units with lunch and 60 units with dinner    Historical Provider, MD  levothyroxine (SYNTHROID, LEVOTHROID) 25 MCG tablet Take 25 mcg by mouth daily before breakfast.     Historical Provider, MD  metFORMIN (GLUCOPHAGE) 1000 MG tablet Take 1,000 mg by mouth 2 (two) times daily with a meal.    Historical Provider, MD  phenazopyridine (PYRIDIUM) 200 MG tablet Take 1 tablet (200 mg total) by mouth 3 (three) times daily as needed for pain. 02/27/14   Claybon Jabs, MD  traMADol (ULTRAM) 50 MG tablet Take 1 tablet (50 mg total) by mouth every 6 (six) hours as needed. 02/27/14   Claybon Jabs, MD   BP 162/86 mmHg  Pulse 110  Temp(Src) 99.1 F (37.3 C) (Oral)  Resp 24  SpO2 98% Physical Exam CONSTITUTIONAL: disheveled, somnolent, diaphoretic HEAD: Normocephalic/atraumatic, no signs of trauma EYES: EOMI/PERRL ENMT: Mucous membranes moist NECK: supple no meningeal signs CV: S1/S2 noted, no murmurs/rubs/gallops noted LUNGS: Lungs are clear to auscultation bilaterally, no apparent distress ABDOMEN: soft, nontender, no rebound or guarding, bowel sounds noted throughout abdomen GU:no cva tenderness NEURO: Pt is somnolent but arousable.  He moves all extremities but does not follow commands   EXTREMITIES: pulses normal/equal, full ROM SKIN: warm, color normal, diaphoretic Fentanyl patch to back PSYCH: unable to assess  ED Course  Procedures   CRITICAL CARE Performed by: Sharyon Cable Total critical care time: 35 Critical care time was exclusive of separately billable procedures and treating other patients. Critical care was necessary to treat or prevent imminent or life-threatening deterioration. Critical care was time spent personally by me on the following activities: development of treatment plan with patient and/or surrogate as well as nursing, discussions with consultants, evaluation of patient's response to treatment, examination of patient, obtaining history from patient or surrogate, ordering and performing treatments and interventions, ordering and review of laboratory studies, ordering and review of radiographic studies, pulse oximetry and re-evaluation of patient's condition. PATIENT REQUIRED IV ATIVAN AND IM GEODON FOR HIS AGITATION.  Patient seen on arrival He was very somnolent on arrival.  Fentanyl patch removed and given narcan 0.4mg IV He then woke up and became very agitated.  He would become very combative and speak nonsensically.  He was flushed and tachycardic, ?anticholinergic toxicity though history didn't support this.  He was given ativan/geodon and eventually become more calm He had a recent transuretheral bladder resection without any complications  57:84 AM Pt is afebrile, vitals have improved and labs are unremarkable Suspect his AMS may be due to multiple medications 12:31 AM CT IMAGING NEGATIVE PT RESTING COMFORTABLY HE IS IN NO DISTRESS VITALS IMPROVED HE IS AFEBRILE - DOUBT MENINGITIS/ENCEPHALITIS SUSPECT THIS MAY BE MEDICATION INDUCED D/W DR [facility] WILL ADMIT FOR OBSERVATION/MONITORING   Labs Review Labs Reviewed  COMPREHENSIVE METABOLIC PANEL - Abnormal; Notable for the following:    Glucose, Bld 152 (*)    All other  components within normal limits  CBC WITH DIFFERENTIAL - Abnormal; Notable for the following:    Hemoglobin 12.9 (*)    HCT 37.7 (*)    All other components within normal limits  ACETAMINOPHEN LEVEL - Abnormal; Notable for the following:    Acetaminophen (Tylenol), Serum <10.0 (*)    All other components within normal limits  URINALYSIS, ROUTINE W REFLEX MICROSCOPIC - Abnormal; Notable for the following:    Hgb urine dipstick LARGE (*)    Leukocytes, UA TRACE (*)    All other components within normal limits  CK - Abnormal; Notable for the following:    Total CK 415 (*)    All other components within normal limits  URINE MICROSCOPIC-ADD ON - Abnormal; Notable for the following:    Casts HYALINE CASTS (*)    All other components within normal limits  CBG MONITORING, ED - Abnormal; Notable for the following:    Glucose-Capillary 150 (*)    All other components within normal limits  I-STAT CG4 LACTIC ACID, ED - Abnormal; Notable for the following:    Lactic Acid, Venous 2.65 (*)    All other components within normal limits  I-STAT ARTERIAL BLOOD GAS, ED - Abnormal; Notable for the following:    pO2, Arterial 53.0 (*)    Bicarbonate 24.9 (*)    All other components within normal limits  URINE CULTURE  SALICYLATE LEVEL  URINE RAPID DRUG SCREEN (HOSP PERFORMED)  ETHANOL    Imaging Review Ct Head Wo Contrast  03/05/2014   CLINICAL DATA:  Difficulty ambulating. Acute onset of intermittent lethargy and confusion. Initial encounter.  EXAM: CT HEAD WITHOUT CONTRAST  TECHNIQUE: Contiguous axial images were obtained from the base of the skull through the vertex without intravenous contrast.  COMPARISON:  CT of the head performed 11/06/2013, and MRI of the brain performed 01/27/2014  FINDINGS: There is no evidence of acute infarction, mass lesion, or intra- or extra-axial hemorrhage on CT.  The posterior fossa, including the cerebellum, brainstem and fourth ventricle, is within normal limits.  The third and lateral ventricles, and basal ganglia are unremarkable in appearance. The cerebral hemispheres are symmetric in appearance, with normal gray-white differentiation. No mass effect or midline shift is seen.  There is no evidence of fracture; visualized osseous structures are unremarkable in appearance. The visualized portions of the orbits are within normal limits. The paranasal sinuses and mastoid air cells are well-aerated. No significant soft tissue abnormalities are seen.  IMPRESSION: Unremarkable noncontrast CT of the head.   Electronically Signed   By: Garald Balding M.D.   On: 03/05/2014 00:14  Dg Chest Portable 1 View  03/04/2014   CLINICAL DATA:  Altered mental status  EXAM: PORTABLE CHEST - 1 VIEW  COMPARISON:  12/14/2011  FINDINGS: Lungs are hypoaerated with crowding of the bronchovascular markings. Heart size upper limits of normal. Central vascular congestion noted. No pleural effusion.  IMPRESSION: Low lung volumes with crowding of the bronchovascular markings but no focal acute finding.   Electronically Signed   By: Conchita Paris M.D.   On: 03/04/2014 23:51     EKG Interpretation   Date/Time:  Wednesday March 04 2014 22:21:31 EST Ventricular Rate:  114 PR Interval:  167 QRS Duration: 122 QT Interval:  347 QTC Calculation: 478 R Axis:   89 Text Interpretation:  Sinus tachycardia Nonspecific intraventricular  conduction delay Baseline wander in lead(s) III aVL No significant change  since last tracing Confirmed by Christy Gentles  MD, Jong Rickman ([id number]) on 03/04/2014  10:40:36 PM     Medications  LORazepam (ATIVAN) injection 1 mg (1 mg Intravenous Given 03/04/14 2212)  ziprasidone (GEODON) injection 20 mg (20 mg Intramuscular Given 03/04/14 2215)  sterile water (preservative free) injection (10 mLs  Given 03/04/14 2215)  LORazepam (ATIVAN) injection 1 mg (1 mg Intravenous Given 03/04/14 2200)  naloxone [facility]) injection 0.4 mg (0.4 mg Intravenous Given 03/04/14 2145)   LORazepam (ATIVAN) injection 1 mg (1 mg Intravenous Given 03/04/14 2150)  sodium chloride 0.9 % bolus 1,000 mL (1,000 mLs Intravenous New Bag/Given 03/05/14 0007)    MDM   Final diagnoses:  Altered mental state  Encephalopathy acute  Acute delirium    Nursing notes including past medical history and social history reviewed and considered in documentation xrays/imaging reviewed by myself and considered during evaluation Labs/vital reviewed myself and considered during evaluation Previous records reviewed and considered     Sharyon Cable, MD 03/05/14 [phone]

## 2014-03-04 NOTE — ED Notes (Signed)
Per EMS... Pt was witnessed by wife having difficulty ambulating. Upon ems arrival pt passed full neuro exam and was following commands, but needed assistance ambulating to the stretcher. EMS reported no deficits noted but in route to the ED pt became lethargic at times and would come back but had some hallucinations. Pt is confused and lethargic on arrival. Ems reported that the wife informed them that the pt has a history of bladder cancer and had surgery for it last week and has a hx of Diabetes, but ems cbg was 121.

## 2014-03-04 NOTE — ED Notes (Signed)
Respiratory contacted for ABG

## 2014-03-05 ENCOUNTER — Inpatient Hospital Stay (HOSPITAL_COMMUNITY): Payer: 59

## 2014-03-05 ENCOUNTER — Encounter (HOSPITAL_COMMUNITY): Payer: Self-pay | Admitting: Internal Medicine

## 2014-03-05 DIAGNOSIS — Z8551 Personal history of malignant neoplasm of bladder: Secondary | ICD-10-CM | POA: Diagnosis not present

## 2014-03-05 DIAGNOSIS — E1140 Type 2 diabetes mellitus with diabetic neuropathy, unspecified: Secondary | ICD-10-CM | POA: Diagnosis present

## 2014-03-05 DIAGNOSIS — K219 Gastro-esophageal reflux disease without esophagitis: Secondary | ICD-10-CM | POA: Diagnosis present

## 2014-03-05 DIAGNOSIS — Z794 Long term (current) use of insulin: Secondary | ICD-10-CM | POA: Diagnosis not present

## 2014-03-05 DIAGNOSIS — E119 Type 2 diabetes mellitus without complications: Secondary | ICD-10-CM

## 2014-03-05 DIAGNOSIS — G9340 Encephalopathy, unspecified: Secondary | ICD-10-CM | POA: Diagnosis present

## 2014-03-05 DIAGNOSIS — N390 Urinary tract infection, site not specified: Secondary | ICD-10-CM | POA: Diagnosis present

## 2014-03-05 DIAGNOSIS — D649 Anemia, unspecified: Secondary | ICD-10-CM

## 2014-03-05 DIAGNOSIS — G8929 Other chronic pain: Secondary | ICD-10-CM | POA: Diagnosis present

## 2014-03-05 DIAGNOSIS — Z720 Tobacco use: Secondary | ICD-10-CM | POA: Diagnosis not present

## 2014-03-05 DIAGNOSIS — R451 Restlessness and agitation: Secondary | ICD-10-CM | POA: Diagnosis present

## 2014-03-05 DIAGNOSIS — Z8673 Personal history of transient ischemic attack (TIA), and cerebral infarction without residual deficits: Secondary | ICD-10-CM | POA: Diagnosis not present

## 2014-03-05 DIAGNOSIS — J449 Chronic obstructive pulmonary disease, unspecified: Secondary | ICD-10-CM | POA: Diagnosis present

## 2014-03-05 DIAGNOSIS — E785 Hyperlipidemia, unspecified: Secondary | ICD-10-CM | POA: Diagnosis present

## 2014-03-05 DIAGNOSIS — Z833 Family history of diabetes mellitus: Secondary | ICD-10-CM | POA: Diagnosis not present

## 2014-03-05 DIAGNOSIS — R410 Disorientation, unspecified: Secondary | ICD-10-CM | POA: Diagnosis present

## 2014-03-05 DIAGNOSIS — R4182 Altered mental status, unspecified: Secondary | ICD-10-CM

## 2014-03-05 DIAGNOSIS — E039 Hypothyroidism, unspecified: Secondary | ICD-10-CM | POA: Diagnosis present

## 2014-03-05 DIAGNOSIS — Z8249 Family history of ischemic heart disease and other diseases of the circulatory system: Secondary | ICD-10-CM | POA: Diagnosis not present

## 2014-03-05 DIAGNOSIS — Z888 Allergy status to other drugs, medicaments and biological substances status: Secondary | ICD-10-CM | POA: Diagnosis not present

## 2014-03-05 DIAGNOSIS — Z882 Allergy status to sulfonamides status: Secondary | ICD-10-CM | POA: Diagnosis not present

## 2014-03-05 LAB — CBC WITH DIFFERENTIAL/PLATELET
Basophils Absolute: 0.1 10*3/uL (ref 0.0–0.1)
Basophils Relative: 1 % (ref 0–1)
Eosinophils Absolute: 0.2 10*3/uL (ref 0.0–0.7)
Eosinophils Relative: 3 % (ref 0–5)
HCT: 36.2 % — ABNORMAL LOW (ref 39.0–52.0)
Hemoglobin: 12.2 g/dL — ABNORMAL LOW (ref 13.0–17.0)
Lymphocytes Relative: 46 % (ref 12–46)
Lymphs Abs: 2.8 10*3/uL (ref 0.7–4.0)
MCH: 28.2 pg (ref 26.0–34.0)
MCHC: 33.7 g/dL (ref 30.0–36.0)
MCV: 83.8 fL (ref 78.0–100.0)
Monocytes Absolute: 0.5 10*3/uL (ref 0.1–1.0)
Monocytes Relative: 8 % (ref 3–12)
Neutro Abs: 2.6 10*3/uL (ref 1.7–7.7)
Neutrophils Relative %: 42 % — ABNORMAL LOW (ref 43–77)
Platelets: 211 10*3/uL (ref 150–400)
RBC: 4.32 MIL/uL (ref 4.22–5.81)
RDW: 12.8 % (ref 11.5–15.5)
WBC: 6.1 10*3/uL (ref 4.0–10.5)

## 2014-03-05 LAB — COMPREHENSIVE METABOLIC PANEL
ALT: 29 U/L (ref 0–53)
AST: 25 U/L (ref 0–37)
Albumin: 3.3 g/dL — ABNORMAL LOW (ref 3.5–5.2)
Alkaline Phosphatase: 66 U/L (ref 39–117)
Anion gap: 4 — ABNORMAL LOW (ref 5–15)
BUN: 12 mg/dL (ref 6–23)
CO2: 28 mmol/L (ref 19–32)
Calcium: 8.5 mg/dL (ref 8.4–10.5)
Chloride: 106 mEq/L (ref 96–112)
Creatinine, Ser: 0.8 mg/dL (ref 0.50–1.35)
GFR calc Af Amer: >90 mL/min (ref >90)
GFR calc non Af Amer: >90 mL/min (ref >90)
Glucose, Bld: 117 mg/dL — ABNORMAL HIGH (ref 70–99)
Potassium: 4 mmol/L (ref 3.5–5.1)
Sodium: 138 mmol/L (ref 135–145)
Total Bilirubin: 0.5 mg/dL (ref 0.3–1.2)
Total Protein: 5.6 g/dL — ABNORMAL LOW (ref 6.0–8.3)

## 2014-03-05 LAB — GLUCOSE, CAPILLARY
Glucose-Capillary: 119 mg/dL — ABNORMAL HIGH (ref 70–99)
Glucose-Capillary: 99 mg/dL (ref 70–99)
Glucose-Capillary: 102 mg/dL — ABNORMAL HIGH (ref 70–99)
Glucose-Capillary: 81 mg/dL (ref 70–99)
Glucose-Capillary: 179 mg/dL — ABNORMAL HIGH (ref 70–99)

## 2014-03-05 LAB — MRSA PCR SCREENING: MRSA by PCR: NEGATIVE

## 2014-03-05 LAB — AMMONIA: Ammonia: 47 umol/L — ABNORMAL HIGH (ref 11–32)

## 2014-03-05 LAB — TSH: TSH: 3.225 u[IU]/mL (ref 0.350–4.500)

## 2014-03-05 LAB — ACETAMINOPHEN LEVEL: Acetaminophen (Tylenol), Serum: <10 ug/mL — ABNORMAL LOW (ref 10–30)

## 2014-03-05 LAB — SALICYLATE LEVEL: Salicylate Lvl: <4 mg/dL (ref 2.8–20.0)

## 2014-03-05 MED ORDER — INSULIN GLARGINE 100 UNIT/ML ~~LOC~~ SOLN
10.0000 [IU] | Freq: Every day | SUBCUTANEOUS | Status: DC
  Administered 2014-03-05 – 2014-03-06 (×2): 10 [IU] via SUBCUTANEOUS
  Filled 2014-03-05 (×2): qty 0.1

## 2014-03-05 MED ORDER — LORAZEPAM 2 MG/ML IJ SOLN: 1.0000 mg | INTRAMUSCULAR | Status: DC | PRN

## 2014-03-05 MED ORDER — SODIUM CHLORIDE 0.9 % IV SOLN
INTRAVENOUS | Status: AC
  Administered 2014-03-05 (×2): via INTRAVENOUS

## 2014-03-05 MED ORDER — INFLUENZA VAC SPLIT QUAD 0.5 ML IM SUSY
0.5000 mL | PREFILLED_SYRINGE | INTRAMUSCULAR | Status: AC
  Administered 2014-03-06: 0.5 mL via INTRAMUSCULAR
  Filled 2014-03-05: qty 0.5

## 2014-03-05 MED ORDER — GADOBENATE DIMEGLUMINE 529 MG/ML IV SOLN
20.0000 mL | Freq: Once | INTRAVENOUS | Status: AC | PRN
  Administered 2014-03-05: 20 mL via INTRAVENOUS

## 2014-03-05 MED ORDER — CEFTRIAXONE SODIUM 1 G IJ SOLR
1.0000 g | INTRAMUSCULAR | Status: DC
  Administered 2014-03-05: 1 g via INTRAVENOUS
  Filled 2014-03-05: qty 10

## 2014-03-05 MED ORDER — ONDANSETRON HCL 4 MG PO TABS: 4.0000 mg | ORAL_TABLET | Freq: Four times a day (QID) | ORAL | Status: DC | PRN

## 2014-03-05 MED ORDER — HYDROMORPHONE HCL 1 MG/ML IJ SOLN
0.5000 mg | INTRAMUSCULAR | Status: DC | PRN
  Administered 2014-03-06 (×2): 0.5 mg via INTRAVENOUS
  Filled 2014-03-05 (×2): qty 1

## 2014-03-05 MED ORDER — ACETAMINOPHEN 650 MG RE SUPP: 650.0000 mg | Freq: Four times a day (QID) | RECTAL | Status: DC | PRN

## 2014-03-05 MED ORDER — ONDANSETRON HCL 4 MG/2ML IJ SOLN: 4.0000 mg | Freq: Four times a day (QID) | INTRAMUSCULAR | Status: DC | PRN

## 2014-03-05 MED ORDER — AMITRIPTYLINE HCL 100 MG PO TABS
200.0000 mg | ORAL_TABLET | Freq: Every day | ORAL | Status: DC
  Administered 2014-03-05: 200 mg via ORAL
  Filled 2014-03-05 (×2): qty 2

## 2014-03-05 MED ORDER — HYDROMORPHONE HCL 1 MG/ML IJ SOLN
1.0000 mg | INTRAMUSCULAR | Status: DC | PRN
Start: 2014-03-05 — End: 2014-03-05

## 2014-03-05 MED ORDER — ACETAMINOPHEN 325 MG PO TABS: 650.0000 mg | ORAL_TABLET | Freq: Four times a day (QID) | ORAL | Status: DC | PRN

## 2014-03-05 MED ORDER — INSULIN ASPART 100 UNIT/ML ~~LOC~~ SOLN
0.0000 [IU] | Freq: Three times a day (TID) | SUBCUTANEOUS | Status: DC
  Administered 2014-03-06: 1 [IU] via SUBCUTANEOUS

## 2014-03-05 NOTE — Progress Notes (Signed)
Utilization review completed. Dorleen Kissel, RN, BSN. 

## 2014-03-05 NOTE — Procedures (Signed)
ELECTROENCEPHALOGRAM REPORT  Patient: [place]       Room #: 5M08 EEG No. ID: [id number] Age: 48 y.o.        Sex: male Referring Physician: Lyman Speller Report Date:  03/05/2014        Interpreting Physician: Anthony Sar  History: WARNIE BELAIR is an 48 y.o. male with a history of bladder cancer, chronic pain syndrome and polypharmacy presenting with altered mental status.  Indications for study:  Rule out partial seizure disorder.  Technique: This is an 18 channel routine scalp EEG performed at the bedside with bipolar and monopolar montages arranged in accordance to the international 10/20 system of electrode placement.   Description: This EEG recording was performed during drowsiness and during light sleep. Background activity during drowsiness consisted of mild generalized slowing with diffuse irregular low amplitude delta and theta activity, as well as intermittent runs of 8-9 Hz alpha rhythm recorded from the posterior head region. Photic stimulation and hyperventilation were not performed. During sleep there was further symmetrical slowing of background activity with normal sleep spindles and vertex waves recorded. No epileptiform discharges were recorded. There was no abnormal slowing of cerebral activity.  Interpretation: This is a normal EEG recording during drowsiness and during light sleep. No evidence of an epileptic disorder was demonstrated.   Rush Farmer M.D. [facility] [phone]

## 2014-03-05 NOTE — Progress Notes (Signed)
EEG completed; results pending.    

## 2014-03-05 NOTE — Consult Note (Signed)
Consult Reason for Consult:altered Referring Physician: Dr Hal Hope  CC: altered mental status  HPI: Vincent [name] is an 48 y.o. male history of TIA, bladder cancer presenting from home with altered mental status. History obtained via patients wife as patient sedated and non-verbal. She reports this afternoon, he went outside, when he came back in he was very confused. Notes he was having difficulty walking, acting very bizarre. She notes he was rummaging around in the silverware drawer stating he was looking for his keys. Speech was gibberish and slurred. She notes episode lasted for around 30 minutes though he remained lethargic after.   Upon arrival to ED noted to be somnolent. Fentanyl patch was removed and given narcan 0.4mg . Woke up, became agitated and combative. Given ativan/geodon and became sedated.  Wife notes he had a similar episode in November, unclear etiology. Had MRI brain without contrast at that time which was normal. Diagnosed with bladder CA last year, surgically removed, has recurrence in October 2015 with repeat surgery. Possible plans for chemotherapy in the future. He has a history of chronic pain. Home medications include Elavil, fentanyl 18mcg patch, prozac, neurontin, dilaudid, ultram. Wife denies any recent fever, illness. + Tobacco use, denies EtOH or drug use. No seizure history. Remote head trauma history. She does note he has been having difficulty with short term memory for the past few months.   In ED received total of 3mg  ativan, 20mg  Geodon and 0.4mg  narcan.   Past Medical History  Diagnosis Date  . COPD (chronic obstructive pulmonary disease)   . GERD (gastroesophageal reflux disease)   . Neuropathy in diabetes     LOWER EXTREMITIES  . Hypothyroidism   . History of TIA (transient ischemic attack) 2008--- NO RESIDUAL  . Diabetes mellitus, type 2   . H/O hiatal hernia   . History of kidney stones   . Urgency of urination   . Gastroparesis   .  Hyperlipidemia   . History of gastric ulcer   . Chronic pain syndrome   . Bladder cancer   . Urinary hesitancy   . At risk for sleep apnea     STOP-BANG=  5       SENT TO PCP 02-24-2014    Past Surgical History  Procedure Laterality Date  . Rotator cuff repair Right 12/2004  . Carpal tunnel release Right 09-16-2003  . Laparoscopic cholecystectomy  11-17-2010  . Cardiac catheterization  12-27-2001  DR Einar Gip  &  05-26-2009  DR VARANASI    RESULTS FOR BOTH ARE NORMAL CORONARIES AND PERSERVED LVF/ EF 60%  . Transurethral resection of bladder tumor N/A 08/09/2012    Procedure: TRANSURETHRAL RESECTION OF BLADDER TUMOR (TURBT) WITH GYRUS WITH MITOMYCIN C;  Surgeon: Claybon Jabs, MD;  Location: [facility];  Service: Urology;  Laterality: N/A;  . Cystoscopy N/A 10/10/2012    Procedure: CYSTOSCOPY CLOT EVACUATION FULGERATION OF BLEEDERS ;  Surgeon: Claybon Jabs, MD;  Location: [place];  Service: Urology;  Laterality: N/A;  . Transurethral resection of bladder tumor with gyrus (turbt-gyrus) N/A 02/27/2014    Procedure: TRANSURETHRAL RESECTION OF BLADDER TUMOR WITH GYRUS (TURBT-GYRUS);  Surgeon: Claybon Jabs, MD;  Location: [facility];  Service: Urology;  Laterality: N/A;    Family History  Problem Relation Age of Onset  . Diabetes Mother   . Diabetes Father   . Hypertension Father   . Kidney disease Father     Social History:  reports that he  has been smoking Cigarettes.  He has a 40 pack-year smoking history. He has never used smokeless tobacco. He reports that he drinks alcohol. He reports that he does not use illicit drugs.  Allergies  Allergen Reactions  . Celebrex [Celecoxib] Rash  . Hydrocodone Rash and Other (See Comments)    "blisters developed on arms"  . Sulfa Antibiotics Rash    Medications: Scheduled:   Head CT imaging reviewed and overall unremarkable. Reviewed prior imaging and brain MRI from 01/2014 also  unremarkable.   ROS: Out of a complete 14 system review, the patient complains of only the following symptoms, and all other reviewed systems are negative. Unable to obtain due to mental status.   Physical Examination: Filed Vitals:   03/05/14 0100  BP: 116/65  Pulse: 94  Temp:   Resp: 16   Physical Exam  Constitutional: He appears well-developed and well-nourished.  Psych: sedated Eyes: No scleral injection HENT: No OP obstrucion Head: Normocephalic, no nuchal rigidity  Cardiovascular: Normal rate and regular rhythm.  Respiratory: Effort normal and breath sounds normal.  GI: Soft. Bowel sounds are normal. No distension. There is no tenderness.  Skin: WDI  Neurologic Examination Mental Status: Heavily sedated. No spontaneous eye opening. Will briefly open to noxious stimuli then returns to sleep. Not following commands Cranial Nerves: II: unable to visualize fundi due to pupil size,   pupils equal, round, reactive to light  III,IV, VI: ptosis not present, eyes midline, no gaze deviation V,VII: no facial asymmetry noted IX,X: gag reflex present XI: unable to test due to mental status XII: unable to test due to mental status Motor: Unable to formally test due to mental status. No spontaneous movement. Withdrawals all extremities symmetrically to noxious stimuli Tone and bulk:normal tone throughout; no atrophy noted Sensory: withdrawals to noxious stimuli in all extremities Deep Tendon Reflexes: 2+ and symmetric throughout Plantars: Right: upgoing   Left: equiovocal Cerebellar: Unable to test due to mental status Gait: unable to test due to mental status  Laboratory Studies:   Basic Metabolic Panel:  Recent Labs Lab 02/27/14 0628 03/04/14 2300  NA 141 142  K 4.2 4.3  CL  --  108  CO2  --  27  GLUCOSE 130* 152*  BUN  --  13  CREATININE  --  0.99  CALCIUM  --  9.3    Liver Function Tests:  Recent Labs Lab 03/04/14 2300  AST 34  ALT 33  ALKPHOS 74   BILITOT 0.7  PROT 6.3  ALBUMIN 3.9   No results for input(s): LIPASE, AMYLASE in the last 168 hours. No results for input(s): AMMONIA in the last 168 hours.  CBC:  Recent Labs Lab 02/27/14 0628 03/04/14 2300  WBC  --  6.9  NEUTROABS  --  3.0  HGB 12.2* 12.9*  HCT 36.0* 37.7*  MCV  --  83.4  PLT  --  235    Cardiac Enzymes:  Recent Labs Lab 03/04/14 2300  CKTOTAL 415*    BNP: Invalid input(s): POCBNP  CBG:  Recent Labs Lab 02/27/14 0817 02/27/14 1044 02/27/14 1404 03/04/14 2123  GLUCAP 102* 25* 131* 150*    Microbiology: Results for orders placed or performed during the hospital encounter of 11/09/10  Surgical pcr screen     Status: Abnormal   Collection Time: 11/09/10  2:31 PM  Result Value Ref Range Status   MRSA, PCR NEGATIVE NEGATIVE Final   Staphylococcus aureus POSITIVE (A) NEGATIVE Final    Comment:  The Xpert SA Assay (FDA approved for NASAL specimens only), is one component of a comprehensive surveillance program.  It is not intended to diagnose infection nor to guide or monitor treatment.    Coagulation Studies: No results for input(s): LABPROT, INR in the last 72 hours.  Urinalysis:  Recent Labs Lab 03/04/14 2312  COLORURINE YELLOW  LABSPEC 1.028  PHURINE 5.5  GLUCOSEU NEGATIVE  HGBUR LARGE*  BILIRUBINUR NEGATIVE  KETONESUR NEGATIVE  PROTEINUR NEGATIVE  UROBILINOGEN 1.0  NITRITE NEGATIVE  LEUKOCYTESUR TRACE*    Lipid Panel:     Component Value Date/Time   CHOL * 05/27/2009 0615    224        ATP III CLASSIFICATION:  <200     mg/dL   Desirable  200-239  mg/dL   Borderline High  >=240    mg/dL   High          TRIG 832* 05/27/2009 0615   HDL 23* 05/27/2009 0615   CHOLHDL 9.7 05/27/2009 0615   VLDL UNABLE TO CALCULATE IF TRIGLYCERIDE OVER 400 mg/dL 05/27/2009 0615   LDLCALC  05/27/2009 0615    UNABLE TO CALCULATE IF TRIGLYCERIDE OVER 400 mg/dL        Total Cholesterol/HDL:CHD Risk Coronary Heart Disease  Risk Table                     Men   Women  1/2 Average Risk   3.4   3.3  Average Risk       5.0   4.4  2 X Average Risk   9.6   7.1  3 X Average Risk  23.4   11.0        Use the calculated Patient Ratio above and the CHD Risk Table to determine the patient's CHD Risk.        ATP III CLASSIFICATION (LDL):  <100     mg/dL   Optimal  100-129  mg/dL   Near or Above                    Optimal  130-159  mg/dL   Borderline  160-189  mg/dL   High  >190     mg/dL   Very High    HgbA1C:  Lab Results  Component Value Date   HGBA1C * 01/21/2007    8.4 (NOTE)   The ADA recommends the following therapeutic goals for glycemic   control related to Hgb A1C measurement:   Goal of Therapy:   < 7.0% Hgb A1C   Action Suggested:  > 8.0% Hgb A1C   Ref:  Diabetes Care, 22, Suppl. 1, 1999    Urine Drug Screen:     Component Value Date/Time   LABOPIA NONE DETECTED 03/04/2014 2312   COCAINSCRNUR NONE DETECTED 03/04/2014 2312   LABBENZ NONE DETECTED 03/04/2014 2312   AMPHETMU NONE DETECTED 03/04/2014 2312   THCU NONE DETECTED 03/04/2014 2312   LABBARB NONE DETECTED 03/04/2014 2312    Alcohol Level:  Recent Labs Lab 03/04/14 2300  ETH <5    Other results:  Imaging: Ct Head Wo Contrast  03/05/2014   CLINICAL DATA:  Difficulty ambulating. Acute onset of intermittent lethargy and confusion. Initial encounter.  EXAM: CT HEAD WITHOUT CONTRAST  TECHNIQUE: Contiguous axial images were obtained from the base of the skull through the vertex without intravenous contrast.  COMPARISON:  CT of the head performed 11/06/2013, and MRI of the brain performed 01/27/2014  FINDINGS: There is no  evidence of acute infarction, mass lesion, or intra- or extra-axial hemorrhage on CT.  The posterior fossa, including the cerebellum, brainstem and fourth ventricle, is within normal limits. The third and lateral ventricles, and basal ganglia are unremarkable in appearance. The cerebral hemispheres are symmetric in  appearance, with normal gray-white differentiation. No mass effect or midline shift is seen.  There is no evidence of fracture; visualized osseous structures are unremarkable in appearance. The visualized portions of the orbits are within normal limits. The paranasal sinuses and mastoid air cells are well-aerated. No significant soft tissue abnormalities are seen.  IMPRESSION: Unremarkable noncontrast CT of the head.   Electronically Signed   By: Garald Balding M.D.   On: 03/05/2014 00:14   Dg Chest Portable 1 View  03/04/2014   CLINICAL DATA:  Altered mental status  EXAM: PORTABLE CHEST - 1 VIEW  COMPARISON:  12/14/2011  FINDINGS: Lungs are hypoaerated with crowding of the bronchovascular markings. Heart size upper limits of normal. Central vascular congestion noted. No pleural effusion.  IMPRESSION: Low lung volumes with crowding of the bronchovascular markings but no focal acute finding.   Electronically Signed   By: Conchita Paris M.D.   On: 03/04/2014 23:51     Assessment/Plan:  48y/o gentleman with history of bladder CA, chronic pain presenting with episode of altered mental status. Had similar episode in the past with unclear etiology. Based on description would have concern for complex partial seizure. History of recurrent bladder cancer raises concern for possible metastatic disease.  Polypharmacy is also potentially playing a role in his symptoms. No signs of infectious etiology.   -check EEG -MRI brain with contrast -check B1, B12, TSH -hold sedating medications  Jim Like, DO [facility] [phone]  If 7pm- 7am, please page neurology on call as listed in [facility]. 03/05/2014, 1:30 AM

## 2014-03-05 NOTE — Progress Notes (Signed)
48yo male having difficulty ambulating, wife noted gibberish and slurred speech x32min, remained lethargic after, concern for metastatic ca and possible polypharmacy, UA abnormal, to begin IV ABX.  Will start Rocephin 1g IV Q24H and monitor CBC and Cx.  Wynona Neat, PharmD, BCPS 03/05/2014 3:24 AM

## 2014-03-05 NOTE — Progress Notes (Signed)
Subjective: Less agitated, still sedated from medications yesterday  Exam: Filed Vitals:   03/05/14 0900  BP: 141/65  Pulse: 85  Temp:   Resp: 14   Gen: In bed, NAD MS: awakens to voice, oriented x 3, follows commands.  YQ:MGNOI, VFF Motor: 5/5 throughout   Impression: 48 yo M with transient confusional episode. MRI without brain mets. Possibilities include medication effect, seizure. I am not certain by history and would not start AEDs at this time unless EEG was abnormal. If these spells continued and no other explanation were, found could consider empiric AED trial, but I think that it would be premature at this time.   Recommendations: 1) EEG 2) will continue to follow.   Roland Rack, MD [facility] [phone]  If 7pm- 7am, please page neurology on call as listed in [place].

## 2014-03-05 NOTE — ED Notes (Signed)
Pt transported to MRI then to the floor after MRI is completed

## 2014-03-05 NOTE — Progress Notes (Signed)
Follow-up note:  35 showed male with past medical history of bladder cancer as well as recent seizure was admitted on 12/24 morning for episodes of altered mental status. Initially there were concerns for seizure. Patient was monitored in the stepdown unit. Status post MRI which was normal. Had EEG with results pending. Neurology consulted who feel like this may be more from medication possibly. No signs of infection. Plan to keep monitoring in stepdown, repeat labs. Patient has since woken up and is quite alert and oriented. May be from medication, as patient is on a number of narcotic and other sedating medications which were all been held, patient does not look like he is having any pain. We'll continue to monitor

## 2014-03-05 NOTE — ED Notes (Signed)
Prior to given Narcan pt was responsive to pain only. Shortly after giving narcan the pt became extremely agitated, confused, speech was incomprehensible, pt had visual and auditory hallucinations, profusely sweating, HR increased to 150, pt was extremely restless and trying to get out of bed. 3 mg of ativan IV had little to no effect. Pt was given 20 mg Geodon IM. Pt now starting to have periods of sleeping for 2 minutes then sudden intense restlessness lasting approximatley 2-3 minutes. Seizure pads placed on rails for pt's safety, EDP aware, family at bedside

## 2014-03-05 NOTE — H&P (Signed)
[facility] History and Physical  Vincent [name] UQJ:[id number] DOB: [birth year] DOA: 03/04/2014  Referring physician: ER physician. PCP: Wenda Low, MD   Chief Complaint: Altered mental status.  History obtained from patient's wife.  HPI: Vincent [name] is a 48 y.o. male who had recent seizure for bladder cancer last week and at that time was placed on tramadol and Pyridium was brought to the ER because of sudden onset of agitation and confusion. As per patient's family patient was doing fine until around 8 in the evening 8 PM and patient went to the patio and came back and was found to be very confused and agitated. Patient was brought to the ER and in the ER ER physician and given Narcan which made patient very agitated and eventually had to be given Ativan 2 mg IV and Geodon following which patient became sedated. CT head did not show anything acute. At this time patient is completely sedated and further neurological exam is difficult. Patient's family states that he had a similar episode 2 months ago and at that time patient's PCP did order MRI brain as an outpatient which was unremarkable. During that time patient's agitation was not so severe. Patient had taken tramadol only once a few days ago. Patient did not have any headache nausea vomiting or did not complain of any chest pain or shortness of breath abdominal pain or diarrhea prior to the episode.   Review of Systems: As presented in the history of presenting illness, rest negative.  Past Medical History  Diagnosis Date  . COPD (chronic obstructive pulmonary disease)   . GERD (gastroesophageal reflux disease)   . Neuropathy in diabetes     LOWER EXTREMITIES  . Hypothyroidism   . History of TIA (transient ischemic attack) 2008--- NO RESIDUAL  . Diabetes mellitus, type 2   . H/O hiatal hernia   . History of kidney stones   . Urgency of urination   . Gastroparesis   . Hyperlipidemia   . History of gastric ulcer   .  Chronic pain syndrome   . Bladder cancer   . Urinary hesitancy   . At risk for sleep apnea     STOP-BANG=  5       SENT TO PCP 02-24-2014   Past Surgical History  Procedure Laterality Date  . Rotator cuff repair Right 12/2004  . Carpal tunnel release Right 09-16-2003  . Laparoscopic cholecystectomy  11-17-2010  . Cardiac catheterization  12-27-2001  DR Einar Gip  &  05-26-2009  DR VARANASI    RESULTS FOR BOTH ARE NORMAL CORONARIES AND PERSERVED LVF/ EF 60%  . Transurethral resection of bladder tumor N/A 08/09/2012    Procedure: TRANSURETHRAL RESECTION OF BLADDER TUMOR (TURBT) WITH GYRUS WITH MITOMYCIN C;  Surgeon: Claybon Jabs, MD;  Location: [facility];  Service: Urology;  Laterality: N/A;  . Cystoscopy N/A 10/10/2012    Procedure: CYSTOSCOPY CLOT EVACUATION FULGERATION OF BLEEDERS ;  Surgeon: Claybon Jabs, MD;  Location: [place];  Service: Urology;  Laterality: N/A;  . Transurethral resection of bladder tumor with gyrus (turbt-gyrus) N/A 02/27/2014    Procedure: TRANSURETHRAL RESECTION OF BLADDER TUMOR WITH GYRUS (TURBT-GYRUS);  Surgeon: Claybon Jabs, MD;  Location: [facility];  Service: Urology;  Laterality: N/A;   Social History:  reports that he has been smoking Cigarettes.  He has a 40 pack-year smoking history. He has never used smokeless tobacco. He reports that he drinks alcohol. He reports that  he does not use illicit drugs. Where does patient live at home. Can patient participate in ADLs? Yes.  Allergies  Allergen Reactions  . Celebrex [Celecoxib] Rash  . Hydrocodone Rash and Other (See Comments)    "blisters developed on arms"  . Sulfa Antibiotics Rash    Family History:  Family History  Problem Relation Age of Onset  . Diabetes Mother   . Diabetes Father   . Hypertension Father   . Kidney disease Father       Prior to Admission medications   Medication Sig Start Date End Date Taking? Authorizing Provider   amitriptyline (ELAVIL) 100 MG tablet Take 200 mg by mouth at bedtime.    Yes Historical Provider, MD  atorvastatin (LIPITOR) 40 MG tablet Take 40 mg by mouth every morning.    Yes Historical Provider, MD  Coenzyme Q10 (CO Q 10) 10 MG CAPS Take 1 tablet by mouth daily.    Yes Historical Provider, MD  esomeprazole (NEXIUM) 20 MG capsule Take 20 mg by mouth daily before breakfast.   Yes Historical Provider, MD  fentaNYL (DURAGESIC - DOSED MCG/HR) 100 MCG/HR Place 100 mcg onto the skin every other day.    Yes Historical Provider, MD  fish oil-omega-3 fatty acids 1000 MG capsule Take 1,000-2,000 mg by mouth 2 (two) times daily. Take 1 capsule in the morning and 2 capsules in the evening   Yes Historical Provider, MD  FLUoxetine (PROZAC) 40 MG capsule Take 40 mg by mouth 2 (two) times daily.    Yes Historical Provider, MD  gabapentin (NEURONTIN) 600 MG tablet Take 600 mg by mouth 4 (four) times daily.   Yes Historical Provider, MD  HYDROmorphone (DILAUDID) 2 MG tablet Take 2 mg by mouth every 4 (four) hours as needed for pain.   Yes Historical Provider, MD  insulin aspart protamine- aspart (NOVOLOG MIX 70/30) (70-30) 100 UNIT/ML injection Inject 80 Units into the skin 2 (two) times daily with a meal. Takes 60 units with breakfast, 20 units with lunch and 60 units with dinner   Yes Historical Provider, MD  levothyroxine (SYNTHROID, LEVOTHROID) 25 MCG tablet Take 25 mcg by mouth daily before breakfast.    Yes Historical Provider, MD  metFORMIN (GLUCOPHAGE) 1000 MG tablet Take 1,000 mg by mouth 2 (two) times daily with a meal.   Yes Historical Provider, MD  phenazopyridine (PYRIDIUM) 200 MG tablet Take 1 tablet (200 mg total) by mouth 3 (three) times daily as needed for pain. 02/27/14  Yes Claybon Jabs, MD  traMADol (ULTRAM) 50 MG tablet Take 1 tablet (50 mg total) by mouth every 6 (six) hours as needed. 02/27/14  Yes Claybon Jabs, MD    Physical Exam: Filed Vitals:   03/05/14 0000 03/05/14 0030  03/05/14 0100 03/05/14 0130  BP: 117/59 117/61 116/65 111/65  Pulse: 99 97 94 88  Temp:      TempSrc:      Resp: 16 16 16 15   SpO2: 97% 98% 98% 98%     General:  Well-developed and nourished.  Eyes: Anicteric no pallor.  ENT: No discharge from the ears eyes nose and mouth.  Neck: No mass felt. No neck rigidity.  Cardiovascular: S1 and S2 heard.  Respiratory: No rhonchi or crepitations.  Abdomen: Soft nontender bowel sounds present.  Skin: No rash.  Musculoskeletal: No edema.  Psychiatric: Patient is sedated.  Neurologic: Patient is sedated.  Labs on Admission:  Basic Metabolic Panel:  Recent Labs Lab 02/27/14 0628 03/04/14 2300  NA 141 142  K 4.2 4.3  CL  --  108  CO2  --  27  GLUCOSE 130* 152*  BUN  --  13  CREATININE  --  0.99  CALCIUM  --  9.3   Liver Function Tests:  Recent Labs Lab 03/04/14 2300  AST 34  ALT 33  ALKPHOS 74  BILITOT 0.7  PROT 6.3  ALBUMIN 3.9   No results for input(s): LIPASE, AMYLASE in the last 168 hours. No results for input(s): AMMONIA in the last 168 hours. CBC:  Recent Labs Lab 02/27/14 0628 03/04/14 2300  WBC  --  6.9  NEUTROABS  --  3.0  HGB 12.2* 12.9*  HCT 36.0* 37.7*  MCV  --  83.4  PLT  --  235   Cardiac Enzymes:  Recent Labs Lab 03/04/14 2300  CKTOTAL 415*    BNP (last 3 results) No results for input(s): PROBNP in the last 8760 hours. CBG:  Recent Labs Lab 02/27/14 0817 02/27/14 1044 02/27/14 1404 03/04/14 2123  GLUCAP 102* 63* 131* 150*    Radiological Exams on Admission: Ct Head Wo Contrast  03/05/2014   CLINICAL DATA:  Difficulty ambulating. Acute onset of intermittent lethargy and confusion. Initial encounter.  EXAM: CT HEAD WITHOUT CONTRAST  TECHNIQUE: Contiguous axial images were obtained from the base of the skull through the vertex without intravenous contrast.  COMPARISON:  CT of the head performed 11/06/2013, and MRI of the brain performed 01/27/2014  FINDINGS: There is no  evidence of acute infarction, mass lesion, or intra- or extra-axial hemorrhage on CT.  The posterior fossa, including the cerebellum, brainstem and fourth ventricle, is within normal limits. The third and lateral ventricles, and basal ganglia are unremarkable in appearance. The cerebral hemispheres are symmetric in appearance, with normal gray-white differentiation. No mass effect or midline shift is seen.  There is no evidence of fracture; visualized osseous structures are unremarkable in appearance. The visualized portions of the orbits are within normal limits. The paranasal sinuses and mastoid air cells are well-aerated. No significant soft tissue abnormalities are seen.  IMPRESSION: Unremarkable noncontrast CT of the head.   Electronically Signed   By: Garald Balding M.D.   On: 03/05/2014 00:14   Dg Chest Portable 1 View  03/04/2014   CLINICAL DATA:  Altered mental status  EXAM: PORTABLE CHEST - 1 VIEW  COMPARISON:  12/14/2011  FINDINGS: Lungs are hypoaerated with crowding of the bronchovascular markings. Heart size upper limits of normal. Central vascular congestion noted. No pleural effusion.  IMPRESSION: Low lung volumes with crowding of the bronchovascular markings but no focal acute finding.   Electronically Signed   By: Conchita Paris M.D.   On: 03/04/2014 23:51    EKG: Independently reviewed. Sinus tachycardia.  Assessment/Plan Principal Problem:   Acute encephalopathy Active Problems:   Diabetes mellitus type 2, controlled   HLD (hyperlipidemia)   Anemia, normocytic normochromic   1. Acute encephalopathy - cause is not clear but family concerning for any seizure-like activity. I did discuss with on-call neurologist Dr. Janann Colonel will be seeing patient in consult. At this time Dr. Janann Colonel as advised to get MRI brain with and without contrast and get EEG. At this time I have placed patient on when necessary IV Ativan for any agitation and also when necessary Dilaudid for pain medication as  patient's pain medicines and fentanyl patch on hold at this time. If patient becomes more alert and awake in the morning most of patient's home medication  has to be restarted. Check ammonia levels. 2. Recent procedure for bladder cancer - UA showing possible UTI and have placed patient on ceftriaxone. Follow urine cultures. 3. Diabetes mellitus type 2 controlled - since patient is nothing by mouth at this time due to mental status changes I have placed patient on Lantus 10 units in the morning since patient is on large doses of NovoLog 70/30. If patient becomes more alert and awake than if patient starts eating and continue his home medications. Sliding-scale coverage for now. 4. Hypothyroidism - continue Synthroid.   DVT Prophylaxis on SCDs and avoiding Lovenox in case patient needs lumbar puncture.  Code Status: Full code.  Family Communication: Patient's family at the bedside.  Disposition Plan: Admit to inpatient.    Lacy Sofia N. [facility] Pager [phone].  If 7PM-7AM, please contact night-coverage [email or web address] Password [email or web address] 03/05/2014, 1:46 AM

## 2014-03-06 DIAGNOSIS — E669 Obesity, unspecified: Secondary | ICD-10-CM

## 2014-03-06 LAB — URINE CULTURE
Colony Count: NO GROWTH
Culture: NO GROWTH

## 2014-03-06 LAB — HEMOGLOBIN A1C
Hgb A1c MFr Bld: 7.4 % — ABNORMAL HIGH (ref <5.7)
Mean Plasma Glucose: 166 mg/dL — ABNORMAL HIGH (ref <117)

## 2014-03-06 LAB — COMPREHENSIVE METABOLIC PANEL
ALT: 26 U/L (ref 0–53)
AST: 22 U/L (ref 0–37)
Albumin: 3.2 g/dL — ABNORMAL LOW (ref 3.5–5.2)
Alkaline Phosphatase: 65 U/L (ref 39–117)
Anion gap: 7 (ref 5–15)
BUN: 8 mg/dL (ref 6–23)
CO2: 24 mmol/L (ref 19–32)
Calcium: 8.7 mg/dL (ref 8.4–10.5)
Chloride: 109 mEq/L (ref 96–112)
Creatinine, Ser: 0.78 mg/dL (ref 0.50–1.35)
GFR calc Af Amer: >90 mL/min (ref >90)
GFR calc non Af Amer: >90 mL/min (ref >90)
Glucose, Bld: 109 mg/dL — ABNORMAL HIGH (ref 70–99)
Potassium: 4.4 mmol/L (ref 3.5–5.1)
Sodium: 140 mmol/L (ref 135–145)
Total Bilirubin: 0.5 mg/dL (ref 0.3–1.2)
Total Protein: 5.6 g/dL — ABNORMAL LOW (ref 6.0–8.3)

## 2014-03-06 LAB — CBC
HCT: 36.1 % — ABNORMAL LOW (ref 39.0–52.0)
Hemoglobin: 12.3 g/dL — ABNORMAL LOW (ref 13.0–17.0)
MCH: 28.1 pg (ref 26.0–34.0)
MCHC: 34.1 g/dL (ref 30.0–36.0)
MCV: 82.6 fL (ref 78.0–100.0)
Platelets: 206 10*3/uL (ref 150–400)
RBC: 4.37 MIL/uL (ref 4.22–5.81)
RDW: 12.5 % (ref 11.5–15.5)
WBC: 6.1 10*3/uL (ref 4.0–10.5)

## 2014-03-06 LAB — CK: Total CK: 263 U/L — ABNORMAL HIGH (ref 7–232)

## 2014-03-06 LAB — GLUCOSE, CAPILLARY
Glucose-Capillary: 99 mg/dL (ref 70–99)
Glucose-Capillary: 122 mg/dL — ABNORMAL HIGH (ref 70–99)

## 2014-03-06 LAB — BRAIN NATRIURETIC PEPTIDE: B Natriuretic Peptide: 95.7 pg/mL (ref 0.0–100.0)

## 2014-03-06 LAB — LACTIC ACID, PLASMA: Lactic Acid, Venous: 1 mmol/L (ref 0.5–2.2)

## 2014-03-06 LAB — AMMONIA: Ammonia: 35 umol/L — ABNORMAL HIGH (ref 11–32)

## 2014-03-06 MED ORDER — PNEUMOCOCCAL VAC POLYVALENT 25 MCG/0.5ML IJ INJ: 0.5000 mL | INJECTION | INTRAMUSCULAR | Status: DC

## 2014-03-06 MED ORDER — CLONIDINE HCL 0.1 MG PO TABS
0.1000 mg | ORAL_TABLET | Freq: Once | ORAL | Status: AC
  Administered 2014-03-06: 0.1 mg via ORAL
  Filled 2014-03-06: qty 1

## 2014-03-06 NOTE — Progress Notes (Signed)
D/c instructions reviewed w/Pt and family . Pt verbalized understanding.

## 2014-03-06 NOTE — Discharge Summary (Signed)
Discharge Summary  Vincent [name] GLO:[id number] DOB: 23-Mar-1965  PCP: Vincent Downing, MD  Admit date: 03/04/2014 Discharge date: 03/06/2014  Time spent: 35 minutes  Recommendations for Outpatient Follow-up:  1. Medication change: Following medicines recommending being discontinued Elavil and metformin 2. Patient will follow-up with his PCP, Vincent [name] in the next 2-3 weeks.  At that time, am recommending that patient have an adjustment in his insulin to compensate for metformin being stopped. In addition, Dr. Arelia [name] can help patient with alternative neuropathic medicine as Elavil should be discontinued. Lastly, given pain control, would recommend discontinuation of Duragesic patch altogether and replacing with extended release scheduled OxyContin. See below.  Other tests that will be followed up: RPR  Discharge Diagnoses:  Active Hospital Problems   Diagnosis Date Noted  . Acute encephalopathy 03/05/2014  . Obesity (BMI 30-39.9) 03/06/2014  . Diabetes mellitus type 2, controlled 03/05/2014  . HLD (hyperlipidemia) 03/05/2014  . Anemia, normocytic normochromic 03/05/2014  . Altered mental state     Resolved Hospital Problems   Diagnosis Date Noted Date Resolved  No resolved problems to display.    Discharge Condition: Improved, being discharged home  Diet recommendation: Carb modified  Filed Weights   03/05/14 0345  Weight: 105.6 kg (232 lb 12.9 oz)    History of present illness:  Patient is a 48 year old male with past history of diabetes mellitus, chronic back pain on a chronic pain regimen and recently diagnosed bladder cancer who had a previous episode about a month ago of altered mental status and agitation was only transient. MRI done at that time was unrevealing. Patient patient was brought in by his wife to the emergency room on 12/23 when he previously had been in good health when his wife at the patio and found very confused and agitated. He was brought in  and given Narcan which made him even more agitated and then required Ativan and Geodon to sedate him. CT was unremarkable. Neurological exam was limited given sedation. There were no other complaints or symptoms prior to the episode. Labs were done on admission which are unremarkable except for a mildly elevated ammonia level at 45 and a lactic acid level 2.3. Neurology was consult and patient was admitted to the hospitalist service  Hospital Course:  Principal Problem:   Acute encephalopathy: By following morning, patient has started to awaken. He was slightly drowsy, but as the day progressed return back to his normal baseline. Repeat blood work done in the afternoon of 12/24 and again on 12/25 were unremarkable. Including CPK and BNP. Lactic acid level repeated was normalized. An EEG was done which was unrevealing. Repeat MRI was unrevealing as well. Given his improvement of symptoms and no other focal findings, neurology felt the patient's symptoms may be medication related.  In regards to his ammonia level, repeat ammonia level was checked on day of discharge was down to 35. Patient has no history of liver disease. HIV and hepatitis panel was unrevealing. Liver function tests and other liver tests were normal so I do not think that he has a hepatic encephalopathy.  Patient is noted to be on a Duragesic patch, when necessary Dilaudid, Neurontin, and Ultram. Initially was concerned about the possibility of narcotic medication leading to hypoxia and I discussed this extensively with the patient's wife as well as the patient. Patient has actually been on the same dose of Neurontin for very long time. He has only rarely used when necessary Dilaudid and it was noted on admission that  his urine drug screen was cleaned. Ultram had just been prescribed last week following scope for evaluation of his bladder cancer. The only unusual finding in regards to his pain medications was that his Duragesic patch does not  last him 72 hours so he takes a fresh one every 48 hours, however he has been doing this for almost 6 months. I recommended in regards to this that he go for a Duragesic patch to scheduled extended release OxyContin.    in regards to his other medications, patient about 5 months ago was changed from Lyrica to Elavil for insurance reasons. Given the Elavil as a tricyclic antidepressant and patient is already on Prozac 40 twice a day, I was concerned about interactions there, and recommended that he is stop the Elavil and possibly go back to Lyrica. He made his help from his PCP to discuss with his insurance company.   Lastly, patient's lactic acid level was elevated on admission. He has no reason for metabolic acidosis and actually his anion gap was normal on admission. He is on metformin so I have recommended he discontinue that altogether and his insulin can be titrated up as per his PCP, however he does note to have good control for his diabetes. The wife that we are not sure of this diagnosis and so recommended that hopefully another episode will not occur, but if it does to take account as to what other medications he had been taking her there had been any changes that day.  Active Problems:   Diabetes mellitus type 2, controlled: As mentioned above, stopping metformin history bladder cancer: As above   Obesity (BMI 30-39.9): Patient meets criteria with BMI greater than 30   Procedures:  EEG: No evidence of seizure activity  Consultations:  Neurology  Discharge Exam: BP 170/81 mmHg  Pulse 74  Temp(Src) 98.4 F (36.9 C) (Oral)  Resp 15  Ht 6' (1.829 m)  Wt 105.6 kg (232 lb 12.9 oz)  BMI 31.57 kg/m2  SpO2 98%  General: Alert and oriented 3, no acute distress Cardiovascular: Regular rate and rhythm, S1-S2 Respiratory: Clear to auscultation bilaterally  Discharge Instructions You were cared for by a hospitalist during your hospital stay. If you have any questions about your  discharge medications or the care you received while you were in the hospital after you are discharged, you can call the unit and asked to speak with the hospitalist on call if the hospitalist that took care of you is not available. Once you are discharged, your primary care physician will handle any further medical issues. Please note that NO REFILLS for any discharge medications will be authorized once you are discharged, as it is imperative that you return to your primary care physician (or establish a relationship with a primary care physician if you do not have one) for your aftercare needs so that they can reassess your need for medications and monitor your lab values.  Discharge Instructions    Diet - low sodium heart healthy    Complete by:  As directed      Increase activity slowly    Complete by:  As directed             Medication List    STOP taking these medications        amitriptyline 100 MG tablet  Commonly known as:  ELAVIL     metFORMIN 1000 MG tablet  Commonly known as:  GLUCOPHAGE     traMADol 50 MG tablet  Commonly known as:  ULTRAM      TAKE these medications        atorvastatin 40 MG tablet  Commonly known as:  LIPITOR  Take 40 mg by mouth every morning.     Co Q 10 10 MG Caps  Take 1 tablet by mouth daily.     esomeprazole 20 MG capsule  Commonly known as:  NEXIUM  Take 20 mg by mouth daily before breakfast.     fentaNYL 100 MCG/HR  Commonly known as:  DURAGESIC - dosed mcg/hr  Place 100 mcg onto the skin every other day.     fish oil-omega-3 fatty acids 1000 MG capsule  Take 1,000-2,000 mg by mouth 2 (two) times daily. Take 1 capsule in the morning and 2 capsules in the evening     FLUoxetine 40 MG capsule  Commonly known as:  PROZAC  Take 40 mg by mouth 2 (two) times daily.     gabapentin 600 MG tablet  Commonly known as:  NEURONTIN  Take 600 mg by mouth 4 (four) times daily.     HYDROmorphone 2 MG tablet  Commonly known as:  DILAUDID    Take 2 mg by mouth every 4 (four) hours as needed for pain.     insulin aspart protamine- aspart (70-30) 100 UNIT/ML injection  Commonly known as:  NOVOLOG MIX 70/30  Inject 80 Units into the skin 2 (two) times daily with a meal. Takes 60 units with breakfast, 20 units with lunch and 60 units with dinner     levothyroxine 25 MCG tablet  Commonly known as:  SYNTHROID, LEVOTHROID  Take 25 mcg by mouth daily before breakfast.     phenazopyridine 200 MG tablet  Commonly known as:  PYRIDIUM  Take 1 tablet (200 mg total) by mouth 3 (three) times daily as needed for pain.       Allergies  Allergen Reactions  . Celebrex [Celecoxib] Rash  . Hydrocodone Rash and Other (See Comments)    "blisters developed on arms"  . Sulfa Antibiotics Rash       Follow-up Information    Follow up with Vincent Downing, MD.   Specialty:  Family Medicine   Why:  Talk to him about stopping Elavil & Metformin   Contact information:   [place] [id number] [phone]        The results of significant diagnostics from this hospitalization (including imaging, microbiology, ancillary and laboratory) are listed below for reference.    Significant Diagnostic Studies: Ct Head Wo Contrast  03/05/2014   CLINICAL DATA:  Difficulty ambulating. Acute onset of intermittent lethargy and confusion. Initial encounter.  EXAM: CT HEAD WITHOUT CONTRAST  TECHNIQUE: Contiguous axial images were obtained from the base of the skull through the vertex without intravenous contrast.  COMPARISON:  CT of the head performed 11/06/2013, and MRI of the brain performed 01/27/2014  FINDINGS: There is no evidence of acute infarction, mass lesion, or intra- or extra-axial hemorrhage on CT.  The posterior fossa, including the cerebellum, brainstem and fourth ventricle, is within normal limits. The third and lateral ventricles, and basal ganglia are unremarkable in appearance. The cerebral hemispheres are  symmetric in appearance, with normal gray-white differentiation. No mass effect or midline shift is seen.  There is no evidence of fracture; visualized osseous structures are unremarkable in appearance. The visualized portions of the orbits are within normal limits. The paranasal sinuses and mastoid air cells are well-aerated. No significant soft tissue abnormalities  are seen.  IMPRESSION: Unremarkable noncontrast CT of the head.   Electronically Signed   By: Garald Balding M.D.   On: 03/05/2014 00:14   Mr Jeri Cos YO Contrast  03/05/2014   CLINICAL DATA:  Initial evaluation for difficulty with ambulation. Slurred speech.  EXAM: MRI HEAD WITHOUT AND WITH CONTRAST  TECHNIQUE: Multiplanar, multiecho pulse sequences of the brain and surrounding structures were obtained without and with intravenous contrast.  CONTRAST:  74mL MULTIHANCE GADOBENATE DIMEGLUMINE 529 MG/ML IV SOLN  COMPARISON:  Prior head CT from earlier the same day.  FINDINGS: The CSF containing spaces are within normal limits for patient age. No focal parenchymal signal abnormality is identified. No mass lesion, midline shift, or extra-axial fluid collection. Ventricles are normal in size without evidence of hydrocephalus. Subependymal nodularity within the bilateral lateral ventricles is stable. No abnormal enhancement.  No diffusion-weighted signal abnormality is identified to suggest acute intracranial infarct. Gray-white matter differentiation is maintained. Normal flow voids are seen within the intracranial vasculature. No intracranial hemorrhage identified.  The cervicomedullary junction is normal. Pituitary gland is within normal limits. Pituitary stalk is midline. The globes and optic nerves demonstrate a normal appearance with normal signal intensity.  The bone marrow signal intensity is normal. Calvarium is intact. Visualized upper cervical spine is within normal limits.  Scalp soft tissues are unremarkable.  Minimal mucosal thickening noted  within the right ethmoidal air cells and maxillary sinuses. No mastoid effusion parent  IMPRESSION: Unremarkable MRI of the brain with no acute intracranial abnormality identified.   Electronically Signed   By: Jeannine Boga M.D.   On: 03/05/2014 04:03   Dg Chest Portable 1 View  03/04/2014   CLINICAL DATA:  Altered mental status  EXAM: PORTABLE CHEST - 1 VIEW  COMPARISON:  12/14/2011  FINDINGS: Lungs are hypoaerated with crowding of the bronchovascular markings. Heart size upper limits of normal. Central vascular congestion noted. No pleural effusion.  IMPRESSION: Low lung volumes with crowding of the bronchovascular markings but no focal acute finding.   Electronically Signed   By: Conchita Paris M.D.   On: 03/04/2014 23:51    Microbiology: Recent Results (from the past 240 hour(s))  Urine culture     Status: None   Collection Time: 03/04/14 11:12 PM  Result Value Ref Range Status   Specimen Description URINE, CATHETERIZED  Final   Special Requests NONE  Final   Colony Count NO GROWTH Performed at [facility]   Final   Culture NO GROWTH Performed at [facility]   Final   Report Status 03/06/2014 FINAL  Final  MRSA PCR Screening     Status: None   Collection Time: 03/05/14  3:50 AM  Result Value Ref Range Status   MRSA by PCR NEGATIVE NEGATIVE Final    Comment:        The GeneXpert MRSA Assay (FDA approved for NASAL specimens only), is one component of a comprehensive MRSA colonization surveillance program. It is not intended to diagnose MRSA infection nor to guide or monitor treatment for MRSA infections.      Labs: Basic Metabolic Panel:  Recent Labs Lab 03/04/14 2300 03/05/14 0600 03/06/14 0328  NA 142 138 140  K 4.3 4.0 4.4  CL 108 106 109  CO2 27 28 24   GLUCOSE 152* 117* 109*  BUN 13 12 8   CREATININE 0.99 0.80 0.78  CALCIUM 9.3 8.5 8.7   Liver Function Tests:  Recent Labs Lab 03/04/14 2300 03/05/14 0600 03/06/14 0328  AST  34 25 22  ALT 33 29 26  ALKPHOS 74 66 65  BILITOT 0.7 0.5 0.5  PROT 6.3 5.6* 5.6*  ALBUMIN 3.9 3.3* 3.2*   No results for input(s): LIPASE, AMYLASE in the last 168 hours.  Recent Labs Lab 03/05/14 0600 03/06/14 0328  AMMONIA 47* 35*   CBC:  Recent Labs Lab 03/04/14 2300 03/05/14 0600 03/06/14 0328  WBC 6.9 6.1 6.1  NEUTROABS 3.0 2.6  --   HGB 12.9* 12.2* 12.3*  HCT 37.7* 36.2* 36.1*  MCV 83.4 83.8 82.6  PLT 235 211 206   Cardiac Enzymes:  Recent Labs Lab 03/04/14 2300 03/06/14 1116  CKTOTAL 415* 263*   BNP: BNP (last 3 results) No results for input(s): PROBNP in the last 8760 hours. CBG:  Recent Labs Lab 03/05/14 1110 03/05/14 1553 03/05/14 2126 03/06/14 0810 03/06/14 1214  GLUCAP 102* 81 179* 99 122*       Signed:  Darshawn Boateng K  [facility] 03/06/2014, 2:58 PM

## 2014-03-06 NOTE — Progress Notes (Signed)
Subjective: Back to baseline  Exam: Filed Vitals:   03/06/14 1215  BP: 170/81  Pulse: 74  Temp: 98.4 F (36.9 C)  Resp: 15   Gen: In bed, NAD MS: awakens to voice, oriented x 3, follows commands.  MW:NUUVO, VFF Motor: 5/5 throughout   Impression: 48 yo M with transient confusional episode. MRI without brain mets. I wonder about medication effect, but patient denies any additional narcotics. Wit return to baseline, would not further investigate at this time, though if further episodes were to occur, may need investigation at that time.   Recommendations: 1) no further recs, will sign off.   Roland Rack, MD [facility] [phone]  If 7pm- 7am, please page neurology on call as listed in [place].

## 2014-03-07 LAB — RPR

## 2014-03-07 LAB — FOLATE RBC: RBC Folate: 1090 ng/mL — ABNORMAL HIGH (ref >280)

## 2014-03-07 LAB — VITAMIN B12: Vitamin B-12: 504 pg/mL (ref 211–911)

## 2014-03-26 ENCOUNTER — Encounter (HOSPITAL_BASED_OUTPATIENT_CLINIC_OR_DEPARTMENT_OTHER): Payer: Self-pay | Admitting: Urology

## 2014-10-28 ENCOUNTER — Other Ambulatory Visit: Payer: Self-pay | Admitting: Family Medicine

## 2014-10-28 DIAGNOSIS — R413 Other amnesia: Secondary | ICD-10-CM

## 2014-11-04 ENCOUNTER — Ambulatory Visit (INDEPENDENT_AMBULATORY_CARE_PROVIDER_SITE_OTHER): Payer: 59 | Admitting: Neurology

## 2014-11-04 ENCOUNTER — Encounter: Payer: Self-pay | Admitting: Neurology

## 2014-11-04 VITALS — BP 121/75 | HR 75 | Ht 72.0 in | Wt 220.0 lb

## 2014-11-04 DIAGNOSIS — F3160 Bipolar disorder, current episode mixed, unspecified: Secondary | ICD-10-CM | POA: Diagnosis not present

## 2014-11-04 DIAGNOSIS — R410 Disorientation, unspecified: Secondary | ICD-10-CM | POA: Diagnosis not present

## 2014-11-04 MED ORDER — LAMOTRIGINE 25 MG PO TABS: ORAL_TABLET | ORAL | Status: DC

## 2014-11-04 MED ORDER — LAMOTRIGINE 100 MG PO TABS: 100.0000 mg | ORAL_TABLET | Freq: Two times a day (BID) | ORAL | Status: DC

## 2014-11-04 NOTE — Progress Notes (Signed)
PATIENT: Vincent [name] DOB: [birth year]  Chief Complaint  Patient presents with  . Memory Loss    MMSE 26/30 - 14 animals. He is here with his wife, Vincent [name], to discuss his intermittment memory loss that has been worsening over the last year.  Says he loses track of time.  Says the loss of memory can last several hours to several days.  Reports having a head injury that caused him to lose consciousness around four years ago but he did not notice memory changes until more recently.     HISTORICAL  Vincent [name] is a 49 years old left-handed male, accompanied by his wife Vincent [name], seen in refer by his primary care physician [name] for evaluation of transient confusion of  Chart reviewed, he had past medical history of hypertension, hyperlipidemia, diabetes, insulin-dependent, history of bladder cancer, bipolar disorder, is taking Lithium, chronic pain, on fentanyl patch, longtime smoker, 2 packs per day  Since November 2015, he had multiple recurrent episode of confusion, sometimes he forgot he has took out his trash can, repeating his questions, was also noted by family to have staring spells, confusion can last up to 8 hours, there was no loss of consciousness   Some of the episode has acute onset, in March 05 2014, he went outside the yard after smoking a cigarette, he suddenly became confused, repeating his questions, agitated, combative, "as if his on drugs " , the confusion last about 24 hours, he was taken by ambulance to the emergency room, UDS was negative,  Lab reviewed, appetite is panel was negative, HIV was negative, ammonia was mildly elevated, it was considered due to polypharmacy fentanyl patch, Dilaudid, Ultram, Neurontin ,    He reported history of head trauma in 2008, his right frontal region was hit by a rotatory plate, transient loss of consciousness, with skin abrasion, had multiple stitches.   have personally reviewed MRI of the brain with and without contrast  December 2015 that was normal, EEG was normal   REVIEW OF SYSTEMS: Full 14 system review of systems performed and notable only for fatigue, hearing loss, ringing ears, blurry vision, double vision, shortness of breath, wheezing, snoring, constipation, feeling hot, increased thirst, joints pain, cramps, achy muscles, allergy, runny nose, memory loss, confusion, headaches, weakness, difficulty swallowing, dizziness, passing out, depression, anxiety, insomnia, sleepiness, not enough sleep, decreased energy, change in appetite, suicidal thoughts, hallucinations, racing thoughts.  ALLERGIES: Allergies  Allergen Reactions  . Celebrex [Celecoxib] Rash  . Hydrocodone Rash and Other (See Comments)    "blisters developed on arms"  . Sulfa Antibiotics Rash    HOME MEDICATIONS: Current Outpatient Prescriptions  Medication Sig Dispense Refill  . atorvastatin (LIPITOR) 10 MG tablet Take 10 mg by mouth daily.    . fenofibrate 160 MG tablet Take 160 mg by mouth daily.    . fentaNYL (DURAGESIC - DOSED MCG/HR) 100 MCG/HR Place 100 mcg onto the skin every other day.     . fish oil-omega-3 fatty acids 1000 MG capsule Take 1,000-2,000 mg by mouth 2 (two) times daily. Take 1 capsule in the morning and 2 capsules in the evening    . gabapentin (NEURONTIN) 600 MG tablet Take 600 mg by mouth 4 (four) times daily.    . insulin aspart protamine- aspart (NOVOLOG MIX 70/30) (70-30) 100 UNIT/ML injection Inject 80 Units into the skin 2 (two) times daily with a meal. Takes 60 units with breakfast, 20 units with lunch and 60 units with  dinner    . levothyroxine (SYNTHROID, LEVOTHROID) 25 MCG tablet Take 25 mcg by mouth daily before breakfast.     . lithium carbonate (LITHOBID) 300 MG CR tablet Take by mouth 2 (two) times daily.    . pregabalin (LYRICA) 50 MG capsule Take 50 mg by mouth 3 (three) times daily.       PAST MEDICAL HISTORY: Past Medical History  Diagnosis Date  . COPD (chronic obstructive pulmonary  disease)   . GERD (gastroesophageal reflux disease)   . Neuropathy in diabetes     LOWER EXTREMITIES  . Hypothyroidism   . History of TIA (transient ischemic attack) 2008--- NO RESIDUAL  . Diabetes mellitus, type 2   . H/O hiatal hernia   . History of kidney stones   . Urgency of urination   . Gastroparesis   . Hyperlipidemia   . History of gastric ulcer   . Chronic pain syndrome   . Bladder cancer   . Urinary hesitancy   . At risk for sleep apnea     STOP-BANG=  5       SENT TO PCP 02-24-2014  . Bipolar 1 disorder   . Carpal tunnel syndrome   . Arthritis   . Memory loss     PAST SURGICAL HISTORY: Past Surgical History  Procedure Laterality Date  . Rotator cuff repair Right 12/2004  . Carpal tunnel release Right 09-16-2003  . Laparoscopic cholecystectomy  11-17-2010  . Cardiac catheterization  12-27-2001  DR Einar Gip  &  05-26-2009  DR VARANASI    RESULTS FOR BOTH ARE NORMAL CORONARIES AND PERSERVED LVF/ EF 60%  . Transurethral resection of bladder tumor N/A 08/09/2012    Procedure: TRANSURETHRAL RESECTION OF BLADDER TUMOR (TURBT) WITH GYRUS WITH MITOMYCIN C;  Surgeon: Claybon Jabs, MD;  Location: [facility];  Service: Urology;  Laterality: N/A;  . Cystoscopy N/A 10/10/2012    Procedure: CYSTOSCOPY CLOT EVACUATION FULGERATION OF BLEEDERS ;  Surgeon: Claybon Jabs, MD;  Location: [place];  Service: Urology;  Laterality: N/A;  . Transurethral resection of bladder tumor with gyrus (turbt-gyrus) N/A 02/27/2014    Procedure: TRANSURETHRAL RESECTION OF BLADDER TUMOR WITH GYRUS (TURBT-GYRUS);  Surgeon: Claybon Jabs, MD;  Location: [facility];  Service: Urology;  Laterality: N/A;  . Cholecystectomy      FAMILY HISTORY: Family History  Problem Relation Age of Onset  . Diabetes Mother   . Diabetes Father   . Hypertension Father   . Heart attack Father   . Heart attack Mother     SOCIAL HISTORY:  Social History   Social  History  . Marital Status: Married    Spouse Name: N/A  . Number of Children: 3  . Years of Education: GED   Occupational History  . Warden/ranger of company   Social History Main Topics  . Smoking status: Current Every Day Smoker -- 2.00 packs/day for 20 years    Types: Cigarettes  . Smokeless tobacco: Never Used  . Alcohol Use: Yes     Comment: RARE  . Drug Use: No  . Sexual Activity: Not on file   Other Topics Concern  . Not on file   Social History Narrative   Lives at home with his wife and children.   Left-handed.   6-8 cups caffeine per day.     PHYSICAL EXAM   Filed Vitals:   11/04/14 1041  BP: 121/75  Pulse: 75  Height: 6' (1.829 m)  Weight: 220 lb (99.791 kg)    Not recorded      Body mass index is 29.83 kg/(m^2).  PHYSICAL EXAMNIATION:  Gen: NAD, conversant, well nourised, obese, well groomed                     Cardiovascular: Regular rate rhythm, no peripheral edema, warm, nontender. Eyes: Conjunctivae clear without exudates or hemorrhage Neck: Supple, no carotid bruise. Pulmonary: Clear to auscultation bilaterally   NEUROLOGICAL EXAM:  MENTAL STATUS: Speech:    Speech is normal; fluent and spontaneous with normal comprehension.  Cognition: MMSE 26/20, animal naming 14     He is not oriented to date, month     Recent and remote memory: He missed 2 out of 3 recalls     Normal Attention span and concentration     Normal Language, naming, repeating,spontaneous speech     Fund of knowledge   CRANIAL NERVES: CN II: Visual fields are full to confrontation. Fundoscopic exam is normal with sharp discs and no vascular changes. Pupils are round equal and briskly reactive to light. CN III, IV, VI: extraocular movement are normal. No ptosis. CN V: Facial sensation is intact to pinprick in all 3 divisions bilaterally. Corneal responses are intact.  CN VII: Face is symmetric with normal eye closure and smile. CN VIII: Hearing  is normal to rubbing fingers CN IX, X: Palate elevates symmetrically. Phonation is normal. CN XI: Head turning and shoulder shrug are intact CN XII: Tongue is midline with normal movements and no atrophy.  MOTOR: There is no pronator drift of out-stretched arms. Muscle bulk and tone are normal. Muscle strength is normal.  REFLEXES: Reflexes are 2+ and symmetric at the biceps, triceps, knees, and ankles. Plantar responses are flexor.  SENSORY: Intact to light touch, pinprick, position sense, and vibration sense are intact in fingers and toes.  COORDINATION: Rapid alternating movements and fine finger movements are intact. There is no dysmetria on finger-to-nose and heel-knee-shin.    GAIT/STANCE: Posture is normal. Mild antaglic gait Romberg is absent.   DIAGNOSTIC DATA (LABS, IMAGING, TESTING) - I reviewed patient records, labs, notes, testing and imaging myself where available.   ASSESSMENT AND PLAN  Vincent [name] is a 49 y.o. male with history of chronic pain, head trauma, bipolar disorder, presenting with recurrent episodes of confusion since November 2015.  Differentiation diagnosis includes complex partial seizure versus mood disorder,  EEG Add on lamotrigine titrating to 100 mg twice a day No driving until episode free for 6 months Return to clinic in 6 weeks, document all events    Marcial Pacas, M.D. Ph.D.  [facility] [address] [place],  [address] Ph: [phone] Fax: [phone]  CC: [name]

## 2014-11-05 ENCOUNTER — Other Ambulatory Visit: Payer: Self-pay

## 2014-11-11 ENCOUNTER — Ambulatory Visit
Admission: RE | Admit: 2014-11-11 | Discharge: 2014-11-11 | Disposition: A | Discharge status: Home / Self Care | Payer: 59 | Source: Ambulatory Visit | Attending: Family Medicine | Admitting: Family Medicine

## 2014-11-11 DIAGNOSIS — R413 Other amnesia: Secondary | ICD-10-CM

## 2014-11-18 ENCOUNTER — Ambulatory Visit (INDEPENDENT_AMBULATORY_CARE_PROVIDER_SITE_OTHER): Payer: 59 | Admitting: Neurology

## 2014-11-18 DIAGNOSIS — R410 Disorientation, unspecified: Secondary | ICD-10-CM

## 2014-11-18 DIAGNOSIS — F3160 Bipolar disorder, current episode mixed, unspecified: Secondary | ICD-10-CM

## 2014-11-18 NOTE — Procedures (Signed)
   HISTORY: 49 year old male presented with transient confusion episodes  TECHNIQUE:  16 channel EEG was performed based on standard 10-16 international system. One channel was dedicated to EKG, which has demonstrates normal sinus rhythm of 72 beats per minutes.  Upon awakening, the posterior background activity was well-developed, in alpha range 8 Hz, with amplitude of 25 microvoltage, reactive to eye opening and closure. There was frequent motion artifact   There was no evidence of epileptiform discharge.  Photic stimulation was  not performed  Hyperventilation was performed, there was no abnormality elicit.  No sleep was achieved.  CONCLUSION: This is a  normal  awake EEG.  There is no electrodiagnostic evidence of epileptiform discharge

## 2014-11-18 NOTE — Progress Notes (Signed)
PATIENT: Vincent [name] DOB: [birth year]  No chief complaint on file.    HISTORICAL  Vincent [name] is a 49 years old left-handed male, accompanied by his wife [name], seen in refer by his primary care physician [name] for evaluation of transient confusion of  Chart reviewed, he had past medical history of hypertension, hyperlipidemia, diabetes, insulin-dependent, history of bladder cancer, bipolar disorder, is taking Lithium, chronic pain, on fentanyl patch, longtime smoker, 2 packs per day  Since November 2015, he had multiple recurrent episode of confusion, sometimes he forgot he has took out his trash can, repeating his questions, was also noted by family to have staring spells, confusion can last up to 8 hours, there was no loss of consciousness   Some of the episode has acute onset, in March 05 2014, he went outside the yard after smoking a cigarette, he suddenly became confused, repeating his questions, agitated, combative, "as if his on drugs " , the confusion last about 24 hours, he was taken by ambulance to the emergency room, UDS was negative,  Lab reviewed, appetite is panel was negative, HIV was negative, ammonia was mildly elevated, it was considered due to polypharmacy fentanyl patch, Dilaudid, Ultram, Neurontin ,    He reported history of head trauma in 2008, his right frontal region was hit by a rotatory plate, transient loss of consciousness, with skin abrasion, had multiple stitches.   have personally reviewed MRI of the brain with and without contrast December 2015 that was normal, EEG was normal   REVIEW OF SYSTEMS: Full 14 system review of systems performed and notable only for fatigue, hearing loss, ringing ears, blurry vision, double vision, shortness of breath, wheezing, snoring, constipation, feeling hot, increased thirst, joints pain, cramps, achy muscles, allergy, runny nose, memory loss, confusion, headaches, weakness, difficulty swallowing,  dizziness, passing out, depression, anxiety, insomnia, sleepiness, not enough sleep, decreased energy, change in appetite, suicidal thoughts, hallucinations, racing thoughts.  ALLERGIES: Allergies  Allergen Reactions  . Celebrex [Celecoxib] Rash  . Hydrocodone Rash and Other (See Comments)    "blisters developed on arms"  . Sulfa Antibiotics Rash    HOME MEDICATIONS: Current Outpatient Prescriptions  Medication Sig Dispense Refill  . atorvastatin (LIPITOR) 10 MG tablet Take 10 mg by mouth daily.    . fenofibrate 160 MG tablet Take 160 mg by mouth daily.    . fentaNYL (DURAGESIC - DOSED MCG/HR) 100 MCG/HR Place 100 mcg onto the skin every other day.     . fish oil-omega-3 fatty acids 1000 MG capsule Take 1,000-2,000 mg by mouth 2 (two) times daily. Take 1 capsule in the morning and 2 capsules in the evening    . gabapentin (NEURONTIN) 600 MG tablet Take 600 mg by mouth 4 (four) times daily.    . insulin aspart protamine- aspart (NOVOLOG MIX 70/30) (70-30) 100 UNIT/ML injection Inject 80 Units into the skin 2 (two) times daily with a meal. Takes 60 units with breakfast, 20 units with lunch and 60 units with dinner    . levothyroxine (SYNTHROID, LEVOTHROID) 25 MCG tablet Take 25 mcg by mouth daily before breakfast.     . lithium carbonate (LITHOBID) 300 MG CR tablet Take by mouth 2 (two) times daily.    . pregabalin (LYRICA) 50 MG capsule Take 50 mg by mouth 3 (three) times daily.       PAST MEDICAL HISTORY: Past Medical History  Diagnosis Date  . COPD (chronic obstructive pulmonary disease)   .  GERD (gastroesophageal reflux disease)   . Neuropathy in diabetes     LOWER EXTREMITIES  . Hypothyroidism   . History of TIA (transient ischemic attack) 2008--- NO RESIDUAL  . Diabetes mellitus, type 2   . H/O hiatal hernia   . History of kidney stones   . Urgency of urination   . Gastroparesis   . Hyperlipidemia   . History of gastric ulcer   . Chronic pain syndrome   . Bladder cancer    . Urinary hesitancy   . At risk for sleep apnea     STOP-BANG=  5       SENT TO PCP 02-24-2014  . Bipolar 1 disorder   . Carpal tunnel syndrome   . Arthritis   . Memory loss     PAST SURGICAL HISTORY: Past Surgical History  Procedure Laterality Date  . Rotator cuff repair Right 12/2004  . Carpal tunnel release Right 09-16-2003  . Laparoscopic cholecystectomy  11-17-2010  . Cardiac catheterization  12-27-2001  DR Einar Gip  &  05-26-2009  DR VARANASI    RESULTS FOR BOTH ARE NORMAL CORONARIES AND PERSERVED LVF/ EF 60%  . Transurethral resection of bladder tumor N/A 08/09/2012    Procedure: TRANSURETHRAL RESECTION OF BLADDER TUMOR (TURBT) WITH GYRUS WITH MITOMYCIN C;  Surgeon: Claybon Jabs, MD;  Location: [facility];  Service: Urology;  Laterality: N/A;  . Cystoscopy N/A 10/10/2012    Procedure: CYSTOSCOPY CLOT EVACUATION FULGERATION OF BLEEDERS ;  Surgeon: Claybon Jabs, MD;  Location: [place];  Service: Urology;  Laterality: N/A;  . Transurethral resection of bladder tumor with gyrus (turbt-gyrus) N/A 02/27/2014    Procedure: TRANSURETHRAL RESECTION OF BLADDER TUMOR WITH GYRUS (TURBT-GYRUS);  Surgeon: Claybon Jabs, MD;  Location: [facility];  Service: Urology;  Laterality: N/A;  . Cholecystectomy      FAMILY HISTORY: Family History  Problem Relation Age of Onset  . Diabetes Mother   . Diabetes Father   . Hypertension Father   . Heart attack Father   . Heart attack Mother     SOCIAL HISTORY:  Social History   Social History  . Marital Status: Married    Spouse Name: N/A  . Number of Children: 3  . Years of Education: GED   Occupational History  . Warden/ranger of company   Social History Main Topics  . Smoking status: Current Every Day Smoker -- 2.00 packs/day for 20 years    Types: Cigarettes  . Smokeless tobacco: Never Used  . Alcohol Use: Yes     Comment: RARE  . Drug Use: No  .  Sexual Activity: Not on file   Other Topics Concern  . Not on file   Social History Narrative   Lives at home with his wife and children.   Left-handed.   6-8 cups caffeine per day.     PHYSICAL EXAM   There were no vitals filed for this visit.  Not recorded      There is no weight on file to calculate BMI.  PHYSICAL EXAMNIATION:  Gen: NAD, conversant, well nourised, obese, well groomed                     Cardiovascular: Regular rate rhythm, no peripheral edema, warm, nontender. Eyes: Conjunctivae clear without exudates or hemorrhage Neck: Supple, no carotid bruise. Pulmonary: Clear to auscultation bilaterally   NEUROLOGICAL EXAM:  MENTAL STATUS: Speech:  Speech is normal; fluent and spontaneous with normal comprehension.  Cognition: MMSE 26/20, animal naming 14     He is not oriented to date, month     Recent and remote memory: He missed 2 out of 3 recalls     Normal Attention span and concentration     Normal Language, naming, repeating,spontaneous speech     Fund of knowledge   CRANIAL NERVES: CN II: Visual fields are full to confrontation. Fundoscopic exam is normal with sharp discs and no vascular changes. Pupils are round equal and briskly reactive to light. CN III, IV, VI: extraocular movement are normal. No ptosis. CN V: Facial sensation is intact to pinprick in all 3 divisions bilaterally. Corneal responses are intact.  CN VII: Face is symmetric with normal eye closure and smile. CN VIII: Hearing is normal to rubbing fingers CN IX, X: Palate elevates symmetrically. Phonation is normal. CN XI: Head turning and shoulder shrug are intact CN XII: Tongue is midline with normal movements and no atrophy.  MOTOR: There is no pronator drift of out-stretched arms. Muscle bulk and tone are normal. Muscle strength is normal.  REFLEXES: Reflexes are 2+ and symmetric at the biceps, triceps, knees, and ankles. Plantar responses are flexor.  SENSORY: Intact to  light touch, pinprick, position sense, and vibration sense are intact in fingers and toes.  COORDINATION: Rapid alternating movements and fine finger movements are intact. There is no dysmetria on finger-to-nose and heel-knee-shin.    GAIT/STANCE: Posture is normal. Mild antaglic gait Romberg is absent.   DIAGNOSTIC DATA (LABS, IMAGING, TESTING) - I reviewed patient records, labs, notes, testing and imaging myself where available.   ASSESSMENT AND PLAN  ATHA MCBAIN is a 49 y.o. male with history of chronic pain, head trauma, bipolar disorder, presenting with recurrent episodes of confusion since November 2015.  Differentiation diagnosis includes complex partial seizure versus mood disorder,  EEG Add on lamotrigine titrating to 100 mg twice a day No driving until episode free for 6 months Return to clinic in 6 weeks, document all events    Marcial Pacas, M.D. Ph.D.  [facility] [address] [place], [place] [address] Ph: [phone] Fax: [phone]  CC: [name]

## 2014-11-19 ENCOUNTER — Telehealth: Payer: Self-pay | Admitting: *Deleted

## 2014-11-19 NOTE — Telephone Encounter (Signed)
-----   Message from Marcial Pacas, MD sent at 11/19/2014  7:40 AM EDT ----- Please call patient for normal EEG

## 2014-11-19 NOTE — Telephone Encounter (Signed)
Aware of normal results. He will continue medications as prescribed and document all events until his next follow up appt.

## 2014-11-30 DIAGNOSIS — Z0289 Encounter for other administrative examinations: Secondary | ICD-10-CM

## 2014-12-16 ENCOUNTER — Encounter: Payer: Self-pay | Admitting: Neurology

## 2014-12-16 ENCOUNTER — Ambulatory Visit (INDEPENDENT_AMBULATORY_CARE_PROVIDER_SITE_OTHER): Payer: 59 | Admitting: Neurology

## 2014-12-16 VITALS — BP 136/87 | HR 68 | Ht 72.0 in | Wt 220.0 lb

## 2014-12-16 DIAGNOSIS — R404 Transient alteration of awareness: Secondary | ICD-10-CM | POA: Diagnosis not present

## 2014-12-16 MED ORDER — LAMOTRIGINE 100 MG PO TABS
100.0000 mg | ORAL_TABLET | Freq: Two times a day (BID) | ORAL | Status: DC
Start: 2014-12-16 — End: 2014-12-17

## 2014-12-16 NOTE — Progress Notes (Signed)
Chief Complaint  Patient presents with  . Altered Mental Status    He is here with wife, [name]. He has noted improvement with Lamictal 100mg , BID.  Reports still having three episodes of confusion since last seen.  They would like to review his EEG.      PATIENT: Vincent [name] DOB: 16-Mar-1965  Chief Complaint  Patient presents with  . Altered Mental Status    He is here with wife, [name]. He has noted improvement with Lamictal 100mg , BID.  Reports still having three episodes of confusion since last seen.  They would like to review his EEG.     HISTORICAL  Vincent [name] is a 49 years old left-handed male, accompanied by his wife [name], seen in refer by his primary care physician [name] for evaluation of transient confusion of  Chart reviewed, he had past medical history of hypertension, hyperlipidemia, diabetes, insulin-dependent, history of bladder cancer, bipolar disorder, is taking Lithium, chronic pain, on fentanyl patch, longtime smoker, 2 packs per day  Since November 2015, he had multiple recurrent episode of confusion, sometimes he forgot he has took out his trash can, repeating his questions, was also noted by family to have staring spells, confusion can last up to 8 hours, there was no loss of consciousness  Some of the episode has acute onset, in March 05 2014, he went outside the yard after smoking a cigarette, he suddenly became confused, repeating his questions, agitated, combative, "as if his on drugs " , the confusion last about 24 hours, he was taken by ambulance to the emergency room, UDS was negative,  Lab reviewed, appetite is panel was negative, HIV was negative, ammonia was mildly elevated, it was considered due to polypharmacy fentanyl patch, Dilaudid, Ultram, Neurontin  He reported history of head trauma in 2008, his right frontal region was hit by a rotatory plate, transient loss of consciousness, with skin abrasion, had multiple stitches.   have  personally reviewed MRI of the brain with and without contrast December 2015 that was normal, EEG was normal   UPDATE Oct 5th 2016: EEG was normal, he tolerated lamotrigine very well, while he was taking 100 mg twice a day, he has much less current episode, only had 3 episode since September 2016, short lasting, mild confusion,   I also watched the video tape March 05 2014, patient was agitated, confused, but no dysarthria, no lateralized motor deficit noticed during the video tape, he was searching through the knife Holder for his keys, could not find towels at his home    wife reported recurrent events, August 24, he has prolonged tremor of his left arm, complains everything look orange, very sleepy afterwards November 06 2014, black stairs, lasting for 10 minutes, disoriented for the rest of the night, fell to sleep during dinnertime August 27, he complains of feeling strange, took a long nap, irritable afterwords August 30 first, disoriented confused, not sure where he was, fell to sleep, broke out in cold sweat afterwards September 2, he slept outside in the chair, woke up slumped to the floor, face looked droopy, September 6, he complains of confused, feel funny, September 10, complains of confused not feeling well,  Had no recurrent episode ever since. His mood has improved with lamotrigine as well   REVIEW OF SYSTEMS: Full 14 system review of systems performed and notable only for as above  ALLERGIES: Allergies  Allergen Reactions  . Celebrex [Celecoxib] Rash  . Hydrocodone Rash and Other (See  Comments)    "blisters developed on arms"  . Sulfa Antibiotics Rash    HOME MEDICATIONS: Current Outpatient Prescriptions  Medication Sig Dispense Refill  . atorvastatin (LIPITOR) 10 MG tablet Take 10 mg by mouth daily.    . fenofibrate 160 MG tablet Take 160 mg by mouth daily.    . fentaNYL (DURAGESIC - DOSED MCG/HR) 100 MCG/HR Place 100 mcg onto the skin every other day.     . fish  oil-omega-3 fatty acids 1000 MG capsule Take 1,000-2,000 mg by mouth 2 (two) times daily. Take 1 capsule in the morning and 2 capsules in the evening    . gabapentin (NEURONTIN) 600 MG tablet Take 600 mg by mouth 4 (four) times daily.    . insulin aspart protamine- aspart (NOVOLOG MIX 70/30) (70-30) 100 UNIT/ML injection Inject 80 Units into the skin 2 (two) times daily with a meal. Takes 60 units with breakfast, 20 units with lunch and 60 units with dinner    . levothyroxine (SYNTHROID, LEVOTHROID) 25 MCG tablet Take 25 mcg by mouth daily before breakfast.     . lithium carbonate (LITHOBID) 300 MG CR tablet Take by mouth 2 (two) times daily.    . pregabalin (LYRICA) 50 MG capsule Take 50 mg by mouth 3 (three) times daily.       PAST MEDICAL HISTORY: Past Medical History  Diagnosis Date  . COPD (chronic obstructive pulmonary disease) ([place])   . GERD (gastroesophageal reflux disease)   . Neuropathy in diabetes ([place])     LOWER EXTREMITIES  . Hypothyroidism   . History of TIA (transient ischemic attack) 2008--- NO RESIDUAL  . Diabetes mellitus, type 2 ([place])   . H/O hiatal hernia   . History of kidney stones   . Urgency of urination   . Gastroparesis   . Hyperlipidemia   . History of gastric ulcer   . Chronic pain syndrome   . Bladder cancer ([place])   . Urinary hesitancy   . At risk for sleep apnea     STOP-BANG=  5       SENT TO PCP 02-24-2014  . Bipolar 1 disorder ([place])   . Carpal tunnel syndrome   . Arthritis   . Memory loss     PAST SURGICAL HISTORY: Past Surgical History  Procedure Laterality Date  . Rotator cuff repair Right 12/2004  . Carpal tunnel release Right 09-16-2003  . Laparoscopic cholecystectomy  11-17-2010  . Cardiac catheterization  12-27-2001  DR Einar Gip  &  05-26-2009  DR VARANASI    RESULTS FOR BOTH ARE NORMAL CORONARIES AND PERSERVED LVF/ EF 60%  . Transurethral resection of bladder tumor N/A 08/09/2012    Procedure: TRANSURETHRAL RESECTION OF BLADDER TUMOR  (TURBT) WITH GYRUS WITH MITOMYCIN C;  Surgeon: Claybon Jabs, MD;  Location: [facility];  Service: Urology;  Laterality: N/A;  . Cystoscopy N/A 10/10/2012    Procedure: CYSTOSCOPY CLOT EVACUATION FULGERATION OF BLEEDERS ;  Surgeon: Claybon Jabs, MD;  Location: [facility];  Service: Urology;  Laterality: N/A;  . Transurethral resection of bladder tumor with gyrus (turbt-gyrus) N/A 02/27/2014    Procedure: TRANSURETHRAL RESECTION OF BLADDER TUMOR WITH GYRUS (TURBT-GYRUS);  Surgeon: Claybon Jabs, MD;  Location: [facility];  Service: Urology;  Laterality: N/A;  . Cholecystectomy      FAMILY HISTORY: Family History  Problem Relation Age of Onset  . Diabetes Mother   . Diabetes Father   . Hypertension Father   .  Heart attack Father   . Heart attack Mother     SOCIAL HISTORY:  Social History   Social History  . Marital Status: Married    Spouse Name: N/A  . Number of Children: 3  . Years of Education: GED   Occupational History  . Warden/ranger of company   Social History Main Topics  . Smoking status: Current Every Day Smoker -- 2.00 packs/day for 20 years    Types: Cigarettes  . Smokeless tobacco: Never Used  . Alcohol Use: Yes     Comment: RARE  . Drug Use: No  . Sexual Activity: Not on file   Other Topics Concern  . Not on file   Social History Narrative   Lives at home with his wife and children.   Left-handed.   6-8 cups caffeine per day.     PHYSICAL EXAM   Filed Vitals:   12/16/14 1117  BP: 136/87  Pulse: 68  Height: 6' (1.829 m)  Weight: 220 lb (99.791 kg)    Not recorded      Body mass index is 29.83 kg/(m^2).  PHYSICAL EXAMNIATION:  Gen: NAD, conversant, well nourised, obese, well groomed                     Cardiovascular: Regular rate rhythm, no peripheral edema, warm, nontender. Eyes: Conjunctivae clear without exudates or hemorrhage Neck: Supple, no carotid  bruise. Pulmonary: Clear to auscultation bilaterally   NEUROLOGICAL EXAM:  MENTAL STATUS: Speech:    Speech is normal; fluent and spontaneous with normal comprehension.  Cognition: MMSE 26/20, animal naming 14     He is not oriented to date, month     Recent and remote memory: He missed 2 out of 3 recalls     Normal Attention span and concentration     Normal Language, naming, repeating,spontaneous speech     Fund of knowledge   CRANIAL NERVES: CN II: Visual fields are full to confrontation. Fundoscopic exam is normal with sharp discs and no vascular changes. Pupils are round equal and briskly reactive to light. CN III, IV, VI: extraocular movement are normal. No ptosis. CN V: Facial sensation is intact to pinprick in all 3 divisions bilaterally. Corneal responses are intact.  CN VII: Face is symmetric with normal eye closure and smile. CN VIII: Hearing is normal to rubbing fingers CN IX, X: Palate elevates symmetrically. Phonation is normal. CN XI: Head turning and shoulder shrug are intact CN XII: Tongue is midline with normal movements and no atrophy.  MOTOR: There is no pronator drift of out-stretched arms. Muscle bulk and tone are normal. Muscle strength is normal.  REFLEXES: Reflexes are 2+ and symmetric at the biceps, triceps, knees, and ankles. Plantar responses are flexor.  SENSORY: Intact to light touch, pinprick, position sense, and vibration sense are intact in fingers and toes.  COORDINATION: Rapid alternating movements and fine finger movements are intact. There is no dysmetria on finger-to-nose and heel-knee-shin.    GAIT/STANCE: Posture is normal. Able to do tandem walking without difficulty Romberg is absent.   DIAGNOSTIC DATA (LABS, IMAGING, TESTING) - I reviewed patient records, labs, notes, testing and imaging myself where available.   ASSESSMENT AND PLAN  Vincent [name] is a 49 y.o. male with history of chronic pain, head trauma, bipolar disorder,  presenting with recurrent episodes of confusion since November 2015.  Differentiation diagnosis remains complex partial seizure versus mood disorder,  He responded  very well to lamotrigine, will titrate up the dosage to 100 mg in the morning, 200 mg every night Document all the event If he continue have recurrent episode, may consider video EEG monitoring  Marcial Pacas, M.D. Ph.D.  [facility] [address] [place], [place] [address] Ph: [phone] Fax: [phone]  CC: [name]

## 2014-12-17 ENCOUNTER — Telehealth: Payer: Self-pay | Admitting: Neurology

## 2014-12-17 ENCOUNTER — Other Ambulatory Visit: Payer: Self-pay | Admitting: *Deleted

## 2014-12-17 DIAGNOSIS — R404 Transient alteration of awareness: Secondary | ICD-10-CM

## 2014-12-17 MED ORDER — LAMOTRIGINE 100 MG PO TABS: ORAL_TABLET | ORAL | Status: DC

## 2014-12-17 NOTE — Telephone Encounter (Signed)
Rx sent in for #90 - spoke to [place] at [place] and she will correct rx and get it ready for the patient.  Called the patient to notify them.

## 2014-12-17 NOTE — Telephone Encounter (Signed)
Pt's wife called stating the lamoTRIgine (LAMICTAL) 100 MG tablet was to be increased to 1 in am and 2 in pm but when she picked up from pharmacy the directions were the same. Please call and advise. She can be reached at [phone].

## 2014-12-25 ENCOUNTER — Ambulatory Visit: Payer: 59 | Admitting: Endocrinology

## 2015-01-08 ENCOUNTER — Encounter: Payer: Self-pay | Admitting: Endocrinology

## 2015-01-08 ENCOUNTER — Ambulatory Visit (INDEPENDENT_AMBULATORY_CARE_PROVIDER_SITE_OTHER): Payer: 59 | Admitting: Endocrinology

## 2015-01-08 VITALS — BP 128/70 | HR 72 | Temp 98.0°F | Ht 72.0 in | Wt 233.0 lb

## 2015-01-08 DIAGNOSIS — E1142 Type 2 diabetes mellitus with diabetic polyneuropathy: Secondary | ICD-10-CM | POA: Diagnosis not present

## 2015-01-08 DIAGNOSIS — Z794 Long term (current) use of insulin: Secondary | ICD-10-CM

## 2015-01-08 MED ORDER — INSULIN ASPART PROT & ASPART (70-30 MIX) 100 UNIT/ML PEN: PEN_INJECTOR | SUBCUTANEOUS | Status: DC

## 2015-01-08 NOTE — Patient Instructions (Addendum)
good diet and exercise significantly improve the control of your diabetes.  please let me know if you wish to be referred to a dietician.  high blood sugar is very risky to your health.  you should see an eye doctor and dentist every year.  It is very important to get all recommended vaccinations.  controlling your blood pressure and cholesterol drastically reduces the damage diabetes does to your body.  Those who smoke should quit.  please discuss these with your doctor.  check your blood sugar twice a day.  vary the time of day when you check, between before the 3 meals, and at bedtime.  also check if you have symptoms of your blood sugar being too high or too low.  please keep a record of the readings and bring it to your next appointment here (or you can bring the meter itself).  You can write it on any piece of paper.  please call us sooner if your blood sugar goes below 70, or if you have a lot of readings over 200.  For now, please change the insulin to 130 units with breakfast, and 70 units with the evening meal.   i have sent a prescription to your pharmacy.   Please come back for a follow-up appointment in 2 weeks.

## 2015-01-08 NOTE — Progress Notes (Signed)
Subjective:    Patient ID: Vincent [name], male    DOB: 1965/06/21, 49 y.o.   MRN: [id number]  HPI pt states DM was dx'ed in 1991; he has severe neuropathy of the lower extremities, and associated gastroparesis; he has been on insulin since 2001; pt says his diet is "ok," but exercise is limited by painful neuropathy; he has never had pancreatitis or DKA.  He has had multiple episodes of severe hypoglycemia (most recently was 2 nights ago), usually in the middle of the night.  He takes premixed insulin 100 units BID.  He says he misses the insulin approx 1 dose per week.  He says this is not necessarily due to cost.  However, he does use syringe and vial, due to cost.  He says cbg's vary widely.  It is highest at lunch, and lowest in am.   Past Medical History  Diagnosis Date  . COPD (chronic obstructive pulmonary disease) ([place])   . GERD (gastroesophageal reflux disease)   . Neuropathy in diabetes ([place])     LOWER EXTREMITIES  . Hypothyroidism   . History of TIA (transient ischemic attack) 2008--- NO RESIDUAL  . Diabetes mellitus, type 2 ([place])   . H/O hiatal hernia   . History of kidney stones   . Urgency of urination   . Gastroparesis   . Hyperlipidemia   . History of gastric ulcer   . Chronic pain syndrome   . Bladder cancer ([place])   . Urinary hesitancy   . At risk for sleep apnea     STOP-BANG=  5       SENT TO PCP 02-24-2014  . Bipolar 1 disorder ([place])   . Carpal tunnel syndrome   . Arthritis   . Memory loss     Past Surgical History  Procedure Laterality Date  . Rotator cuff repair Right 12/2004  . Carpal tunnel release Right 09-16-2003  . Laparoscopic cholecystectomy  11-17-2010  . Cardiac catheterization  12-27-2001  DR Einar Gip  &  05-26-2009  DR VARANASI    RESULTS FOR BOTH ARE NORMAL CORONARIES AND PERSERVED LVF/ EF 60%  . Transurethral resection of bladder tumor N/A 08/09/2012    Procedure: TRANSURETHRAL RESECTION OF BLADDER TUMOR (TURBT) WITH GYRUS WITH MITOMYCIN C;   Surgeon: Claybon Jabs, MD;  Location: [facility];  Service: Urology;  Laterality: N/A;  . Cystoscopy N/A 10/10/2012    Procedure: CYSTOSCOPY CLOT EVACUATION FULGERATION OF BLEEDERS ;  Surgeon: Claybon Jabs, MD;  Location: [facility];  Service: Urology;  Laterality: N/A;  . Transurethral resection of bladder tumor with gyrus (turbt-gyrus) N/A 02/27/2014    Procedure: TRANSURETHRAL RESECTION OF BLADDER TUMOR WITH GYRUS (TURBT-GYRUS);  Surgeon: Claybon Jabs, MD;  Location: [facility];  Service: Urology;  Laterality: N/A;  . Cholecystectomy      Social History   Social History  . Marital Status: Married    Spouse Name: N/A  . Number of Children: 3  . Years of Education: GED   Occupational History  . Warden/ranger of company   Social History Main Topics  . Smoking status: Current Every Day Smoker -- 2.00 packs/day for 20 years    Types: Cigarettes  . Smokeless tobacco: Never Used  . Alcohol Use: Yes     Comment: RARE  . Drug Use: No  . Sexual Activity: Not on file   Other Topics Concern  . Not on file  Social History Narrative   Lives at home with his wife and children.   Left-handed.   6-8 cups caffeine per day.    Current Outpatient Prescriptions on File Prior to Visit  Medication Sig Dispense Refill  . atorvastatin (LIPITOR) 10 MG tablet Take 10 mg by mouth daily.    . fenofibrate 160 MG tablet Take 160 mg by mouth daily.    . fentaNYL (DURAGESIC - DOSED MCG/HR) 100 MCG/HR Place 100 mcg onto the skin every other day.     . fish oil-omega-3 fatty acids 1000 MG capsule Take 1,000-2,000 mg by mouth 2 (two) times daily. Take 1 capsule in the morning and 2 capsules in the evening    . folic acid (FOLVITE) 1 MG tablet Take 2 mg by mouth daily.    Marland Kitchen gabapentin (NEURONTIN) 600 MG tablet Take 600 mg by mouth 4 (four) times daily.    Marland Kitchen lamoTRIgine (LAMICTAL) 100 MG tablet Take one tablet in the am and two  tablets at bedtime. 90 tablet 11  . levothyroxine (SYNTHROID, LEVOTHROID) 25 MCG tablet Take 25 mcg by mouth daily before breakfast.     . lithium carbonate (LITHOBID) 300 MG CR tablet Take by mouth 2 (two) times daily.    . methotrexate (RHEUMATREX) 2.5 MG tablet Take 2.5 mg by mouth once a week. Taking 5 tablets weekly. Caution:Chemotherapy. Protect from light.    . pregabalin (LYRICA) 50 MG capsule Take 50 mg by mouth 3 (three) times daily.     No current facility-administered medications on file prior to visit.    Allergies  Allergen Reactions  . Celebrex [Celecoxib] Rash  . Hydrocodone Rash and Other (See Comments)    "blisters developed on arms"  . Sulfa Antibiotics Rash    Family History  Problem Relation Age of Onset  . Diabetes Mother   . Diabetes Father   . Hypertension Father   . Heart attack Father   . Heart attack Mother     BP 128/70 mmHg  Pulse 72  Temp(Src) 98 F (36.7 C) (Oral)  Ht 6' (1.829 m)  Wt 233 lb (105.688 kg)  BMI 31.59 kg/m2  SpO2 93%  Review of Systems denies weight loss, headache, sob, n/v, muscle cramps, excessive diaphoresis, and rhinorrhea.  He has seen opthal for chronically blurry vision.  He has had several neg cardiol evals for chest pain.  He has fatigue, depression, cold intolerance, easy bruising, and urinary frequency.      Objective:   Physical Exam VS: see vs page GEN: no distress HEAD: head: no deformity eyes: no periorbital swelling, no proptosis external nose and ears are normal mouth: no lesion seen NECK: supple, thyroid is not enlarged CHEST WALL: no deformity LUNGS: clear to auscultation, except for exp wheezes BREASTS:  No gynecomastia CV: reg rate and rhythm, no murmur ABD: abdomen is soft, nontender.  no hepatosplenomegaly.  not distended.  no hernia MUSCULOSKELETAL: muscle bulk and strength are grossly normal.  no obvious joint swelling.  gait is normal and steady EXTEMITIES: no deformity.  no ulcer on the feet.   feet are of normal color and temp.  no edema PULSES: dorsalis pedis intact bilat.  no carotid bruit NEURO:  cn 2-12 grossly intact.   readily moves all 4's.  sensation is intact to touch on the feet, but decreased from normal. SKIN:  Normal texture and temperature.  No rash or suspicious lesion is visible.   NODES:  None palpable at the neck.  PSYCH: alert,  well-oriented.  Does not appear anxious nor depressed.  outside test results are reviewed: A1c=11.5% (12/09/14)  i personally reviewed electrocardiogram tracing (02/03/14): Indication: encephalopathy. Impression: sinus tachycardia.    I have reviewed outside records, and summarized:  Pt was hospitalized in 2015 for acute encephalopathy.       Assessment & Plan:  DM: severe exacerbation.   H/o acute AMS and chronic depression: new to me: this complicates the rx of DM.  He may not be a candidate for multiple daily injections, but we'll follow for now.  He wants to change from syringe and vial to pen.    Patient is advised the following: Patient Instructions  good diet and exercise significantly improve the control of your diabetes.  please let me know if you wish to be referred to a dietician.  high blood sugar is very risky to your health.  you should see an eye doctor and dentist every year.  It is very important to get all recommended vaccinations.  controlling your blood pressure and cholesterol drastically reduces the damage diabetes does to your body.  Those who smoke should quit.  please discuss these with your doctor.  check your blood sugar twice a day.  vary the time of day when you check, between before the 3 meals, and at bedtime.  also check if you have symptoms of your blood sugar being too high or too low.  please keep a record of the readings and bring it to your next appointment here (or you can bring the meter itself).  You can write it on any piece of paper.  please call us sooner if your blood sugar goes below 70, or if  you have a lot of readings over 200.  For now, please change the insulin to 130 units with breakfast, and 70 units with the evening meal.   i have sent a prescription to your pharmacy.   Please come back for a follow-up appointment in 2 weeks.

## 2015-01-11 ENCOUNTER — Telehealth: Payer: Self-pay | Admitting: Endocrinology

## 2015-01-11 NOTE — Telephone Encounter (Signed)
Patient wife stated insurance will not cover, insulin aspart protamine - aspart (NOVOLOG MIX 70/30 FLEXPEN) (70-30) 100 UNIT/ML Flexn Pen. It need a Prior Auth

## 2015-01-12 MED ORDER — INSULIN LISPRO PROT & LISPRO (75-25 MIX) 100 UNIT/ML KWIKPEN: PEN_INJECTOR | SUBCUTANEOUS | Status: DC

## 2015-01-12 NOTE — Telephone Encounter (Signed)
ok 

## 2015-01-12 NOTE — Telephone Encounter (Signed)
I contacted the pt's wife and advise Humalog 75/25 has been sent as an alternative. Pt's wife voiced understanding.

## 2015-01-12 NOTE — Telephone Encounter (Signed)
See note below. I checked the pt's formulary. Novolog 70/30 is non preferred, Humalog 75/25 if preferred. Please advise if ok to send. Thanks!

## 2015-01-22 ENCOUNTER — Encounter: Payer: Self-pay | Admitting: Endocrinology

## 2015-01-22 ENCOUNTER — Ambulatory Visit (INDEPENDENT_AMBULATORY_CARE_PROVIDER_SITE_OTHER): Payer: 59 | Admitting: Endocrinology

## 2015-01-22 VITALS — BP 130/87 | HR 86 | Temp 98.1°F | Ht 73.0 in | Wt 238.0 lb

## 2015-01-22 DIAGNOSIS — E1143 Type 2 diabetes mellitus with diabetic autonomic (poly)neuropathy: Secondary | ICD-10-CM | POA: Diagnosis not present

## 2015-01-22 DIAGNOSIS — Z794 Long term (current) use of insulin: Secondary | ICD-10-CM

## 2015-01-22 MED ORDER — INSULIN LISPRO PROT & LISPRO (75-25 MIX) 100 UNIT/ML KWIKPEN: PEN_INJECTOR | SUBCUTANEOUS | Status: DC

## 2015-01-22 NOTE — Patient Instructions (Addendum)
check your blood sugar twice a day.  vary the time of day when you check, between before the 3 meals, and at bedtime.  also check if you have symptoms of your blood sugar being too high or too low.  please keep a record of the readings and bring it to your next appointment here (or you can bring the meter itself).  You can write it on any piece of paper.  please call us sooner if your blood sugar goes below 70, or if you have a lot of readings over 200.  For now, please increase the insulin to 150 units with breakfast (however, just take 100 if you are going to be active), and 70 units with the evening meal.   If you are unable to anticipate the activity, try to eat a light snack with the activity.   Please come back for a follow-up appointment in 1 month.

## 2015-01-22 NOTE — Progress Notes (Signed)
Subjective:    Patient ID: Vincent [name], male    DOB: 12/06/1965, 49 y.o.   MRN: [id number]  HPI Pt returns for f/u of diabetes mellitus: DM type: Insulin-requiring type 2 Dx'ed: 99991111 Complications: polyneuropathy and gastroparesis Therapy: insulin since 2001 DKA: never Severe hypoglycemia: many episodes of severe hypoglycemia (most recent was Oct 2016) Pancreatitis: never Other: due to AMS and severe hypoglycemia, he is not a candidate for a1c < 7 Interval history: no cbg record, but states cbg's vary from 79-293.  It is in general higher as the day goes on.  However, the cbg of 79 was when he was very active at work.  pt states he feels well in general.  He never misses the insulin.   Past Medical History  Diagnosis Date  . COPD (chronic obstructive pulmonary disease) ([place])   . GERD (gastroesophageal reflux disease)   . Neuropathy in diabetes ([place])     LOWER EXTREMITIES  . Hypothyroidism   . History of TIA (transient ischemic attack) 2008--- NO RESIDUAL  . Diabetes mellitus, type 2 ([place])   . H/O hiatal hernia   . History of kidney stones   . Urgency of urination   . Gastroparesis   . Hyperlipidemia   . History of gastric ulcer   . Chronic pain syndrome   . Bladder cancer ([place])   . Urinary hesitancy   . At risk for sleep apnea     STOP-BANG=  5       SENT TO PCP 02-24-2014  . Bipolar 1 disorder ([place])   . Carpal tunnel syndrome   . Arthritis   . Memory loss     Past Surgical History  Procedure Laterality Date  . Rotator cuff repair Right 12/2004  . Carpal tunnel release Right 09-16-2003  . Laparoscopic cholecystectomy  11-17-2010  . Cardiac catheterization  12-27-2001  DR Einar Gip  &  05-26-2009  DR VARANASI    RESULTS FOR BOTH ARE NORMAL CORONARIES AND PERSERVED LVF/ EF 60%  . Transurethral resection of bladder tumor N/A 08/09/2012    Procedure: TRANSURETHRAL RESECTION OF BLADDER TUMOR (TURBT) WITH GYRUS WITH MITOMYCIN C;  Surgeon: Claybon Jabs, MD;  Location:  [facility];  Service: Urology;  Laterality: N/A;  . Cystoscopy N/A 10/10/2012    Procedure: CYSTOSCOPY CLOT EVACUATION FULGERATION OF BLEEDERS ;  Surgeon: Claybon Jabs, MD;  Location: [place];  Service: Urology;  Laterality: N/A;  . Transurethral resection of bladder tumor with gyrus (turbt-gyrus) N/A 02/27/2014    Procedure: TRANSURETHRAL RESECTION OF BLADDER TUMOR WITH GYRUS (TURBT-GYRUS);  Surgeon: Claybon Jabs, MD;  Location: [facility];  Service: Urology;  Laterality: N/A;  . Cholecystectomy      Social History   Social History  . Marital Status: Married    Spouse Name: N/A  . Number of Children: 3  . Years of Education: GED   Occupational History  . Warden/ranger of company   Social History Main Topics  . Smoking status: Current Every Day Smoker -- 2.00 packs/day for 20 years    Types: Cigarettes  . Smokeless tobacco: Never Used  . Alcohol Use: Yes     Comment: RARE  . Drug Use: No  . Sexual Activity: Not on file   Other Topics Concern  . Not on file   Social History Narrative   Lives at home with his wife and children.   Left-handed.  6-8 cups caffeine per day.    Current Outpatient Prescriptions on File Prior to Visit  Medication Sig Dispense Refill  . atorvastatin (LIPITOR) 10 MG tablet Take 10 mg by mouth daily.    . fenofibrate 160 MG tablet Take 160 mg by mouth daily.    . fentaNYL (DURAGESIC - DOSED MCG/HR) 100 MCG/HR Place 100 mcg onto the skin every other day.     . fish oil-omega-3 fatty acids 1000 MG capsule Take 1,000-2,000 mg by mouth 2 (two) times daily. Take 1 capsule in the morning and 2 capsules in the evening    . folic acid (FOLVITE) 1 MG tablet Take 2 mg by mouth daily.    Marland Kitchen gabapentin (NEURONTIN) 600 MG tablet Take 600 mg by mouth 4 (four) times daily.    Marland Kitchen lamoTRIgine (LAMICTAL) 100 MG tablet Take one tablet in the am and two tablets at bedtime. 90 tablet 11  .  levothyroxine (SYNTHROID, LEVOTHROID) 25 MCG tablet Take 25 mcg by mouth daily before breakfast.     . lithium carbonate (LITHOBID) 300 MG CR tablet Take by mouth 2 (two) times daily.    . methotrexate (RHEUMATREX) 2.5 MG tablet Take 2.5 mg by mouth once a week. Taking 5 tablets weekly. Caution:Chemotherapy. Protect from light.    . pregabalin (LYRICA) 50 MG capsule Take 50 mg by mouth 3 (three) times daily.     No current facility-administered medications on file prior to visit.    Allergies  Allergen Reactions  . Celebrex [Celecoxib] Rash  . Hydrocodone Rash and Other (See Comments)    "blisters developed on arms"  . Sulfa Antibiotics Rash    Family History  Problem Relation Age of Onset  . Diabetes Mother   . Diabetes Father   . Hypertension Father   . Heart attack Father   . Heart attack Mother     BP 130/87 mmHg  Pulse 86  Temp(Src) 98.1 F (36.7 C) (Oral)  Ht 6\' 1"  (1.854 m)  Wt 238 lb (107.956 kg)  BMI 31.41 kg/m2  SpO2 97%   Review of Systems He denies hypoglycemia    Objective:   Physical Exam VITAL SIGNS:  See vs page GENERAL: no distress SKIN:  Insulin injection sites at the anterior abdomen are normal.        Assessment & Plan:  DM: much better.  He needs increased rx.  We discussed options: he wants to stay with BID insulin for now.    Patient is advised the following: Patient Instructions  check your blood sugar twice a day.  vary the time of day when you check, between before the 3 meals, and at bedtime.  also check if you have symptoms of your blood sugar being too high or too low.  please keep a record of the readings and bring it to your next appointment here (or you can bring the meter itself).  You can write it on any piece of paper.  please call us sooner if your blood sugar goes below 70, or if you have a lot of readings over 200.  For now, please increase the insulin to 150 units with breakfast (however, just take 100 if you are going to be  active), and 70 units with the evening meal.   If you are unable to anticipate the activity, try to eat a light snack with the activity.   Please come back for a follow-up appointment in 1 month.

## 2015-01-23 DIAGNOSIS — E119 Type 2 diabetes mellitus without complications: Secondary | ICD-10-CM | POA: Insufficient documentation

## 2015-02-10 ENCOUNTER — Other Ambulatory Visit: Payer: Self-pay | Admitting: Orthopedic Surgery

## 2015-02-10 DIAGNOSIS — G5601 Carpal tunnel syndrome, right upper limb: Secondary | ICD-10-CM | POA: Insufficient documentation

## 2015-02-10 DIAGNOSIS — G5602 Carpal tunnel syndrome, left upper limb: Secondary | ICD-10-CM | POA: Insufficient documentation

## 2015-02-22 ENCOUNTER — Encounter (HOSPITAL_BASED_OUTPATIENT_CLINIC_OR_DEPARTMENT_OTHER): Payer: Self-pay | Admitting: *Deleted

## 2015-02-23 ENCOUNTER — Ambulatory Visit: Payer: 59 | Admitting: Endocrinology

## 2015-02-25 ENCOUNTER — Ambulatory Visit (HOSPITAL_BASED_OUTPATIENT_CLINIC_OR_DEPARTMENT_OTHER): Payer: 59 | Admitting: Anesthesiology

## 2015-02-25 ENCOUNTER — Ambulatory Visit (HOSPITAL_BASED_OUTPATIENT_CLINIC_OR_DEPARTMENT_OTHER)
Admission: RE | Admit: 2015-02-25 | Discharge: 2015-02-25 | Disposition: A | Discharge status: Home / Self Care | Payer: 59 | Source: Ambulatory Visit | Attending: Orthopedic Surgery | Admitting: Orthopedic Surgery

## 2015-02-25 ENCOUNTER — Encounter (HOSPITAL_BASED_OUTPATIENT_CLINIC_OR_DEPARTMENT_OTHER): Payer: Self-pay | Admitting: *Deleted

## 2015-02-25 ENCOUNTER — Encounter (HOSPITAL_BASED_OUTPATIENT_CLINIC_OR_DEPARTMENT_OTHER)
Admission: RE | Disposition: A | Discharge status: Home / Self Care | Payer: Self-pay | Source: Ambulatory Visit | Attending: Orthopedic Surgery

## 2015-02-25 DIAGNOSIS — E785 Hyperlipidemia, unspecified: Secondary | ICD-10-CM | POA: Diagnosis not present

## 2015-02-25 DIAGNOSIS — Z882 Allergy status to sulfonamides status: Secondary | ICD-10-CM | POA: Insufficient documentation

## 2015-02-25 DIAGNOSIS — F419 Anxiety disorder, unspecified: Secondary | ICD-10-CM | POA: Diagnosis not present

## 2015-02-25 DIAGNOSIS — E039 Hypothyroidism, unspecified: Secondary | ICD-10-CM | POA: Insufficient documentation

## 2015-02-25 DIAGNOSIS — Z87442 Personal history of urinary calculi: Secondary | ICD-10-CM | POA: Insufficient documentation

## 2015-02-25 DIAGNOSIS — F319 Bipolar disorder, unspecified: Secondary | ICD-10-CM | POA: Insufficient documentation

## 2015-02-25 DIAGNOSIS — Z8551 Personal history of malignant neoplasm of bladder: Secondary | ICD-10-CM | POA: Diagnosis not present

## 2015-02-25 DIAGNOSIS — Z9049 Acquired absence of other specified parts of digestive tract: Secondary | ICD-10-CM | POA: Diagnosis not present

## 2015-02-25 DIAGNOSIS — E7800 Pure hypercholesterolemia, unspecified: Secondary | ICD-10-CM | POA: Insufficient documentation

## 2015-02-25 DIAGNOSIS — G894 Chronic pain syndrome: Secondary | ICD-10-CM | POA: Insufficient documentation

## 2015-02-25 DIAGNOSIS — Z885 Allergy status to narcotic agent status: Secondary | ICD-10-CM | POA: Diagnosis not present

## 2015-02-25 DIAGNOSIS — Z888 Allergy status to other drugs, medicaments and biological substances status: Secondary | ICD-10-CM | POA: Diagnosis not present

## 2015-02-25 DIAGNOSIS — K219 Gastro-esophageal reflux disease without esophagitis: Secondary | ICD-10-CM | POA: Insufficient documentation

## 2015-02-25 DIAGNOSIS — M1990 Unspecified osteoarthritis, unspecified site: Secondary | ICD-10-CM | POA: Insufficient documentation

## 2015-02-25 DIAGNOSIS — J449 Chronic obstructive pulmonary disease, unspecified: Secondary | ICD-10-CM | POA: Diagnosis not present

## 2015-02-25 DIAGNOSIS — F17200 Nicotine dependence, unspecified, uncomplicated: Secondary | ICD-10-CM | POA: Diagnosis not present

## 2015-02-25 DIAGNOSIS — Z79899 Other long term (current) drug therapy: Secondary | ICD-10-CM | POA: Insufficient documentation

## 2015-02-25 DIAGNOSIS — Z8673 Personal history of transient ischemic attack (TIA), and cerebral infarction without residual deficits: Secondary | ICD-10-CM | POA: Diagnosis not present

## 2015-02-25 DIAGNOSIS — G5602 Carpal tunnel syndrome, left upper limb: Secondary | ICD-10-CM | POA: Insufficient documentation

## 2015-02-25 DIAGNOSIS — Z794 Long term (current) use of insulin: Secondary | ICD-10-CM | POA: Diagnosis not present

## 2015-02-25 DIAGNOSIS — E1165 Type 2 diabetes mellitus with hyperglycemia: Secondary | ICD-10-CM | POA: Diagnosis not present

## 2015-02-25 HISTORY — PX: CARPAL TUNNEL RELEASE: SHX101

## 2015-02-25 HISTORY — DX: Anxiety disorder, unspecified: F41.9

## 2015-02-25 LAB — GLUCOSE, CAPILLARY
Glucose-Capillary: 122 mg/dL — ABNORMAL HIGH (ref 65–99)
Glucose-Capillary: 90 mg/dL (ref 65–99)

## 2015-02-25 SURGERY — CARPAL TUNNEL RELEASE
Anesthesia: Monitor Anesthesia Care | Site: Wrist | Laterality: Left

## 2015-02-25 MED ORDER — CHLORHEXIDINE GLUCONATE 4 % EX LIQD: 60.0000 mL | Freq: Once | CUTANEOUS | Status: DC

## 2015-02-25 MED ORDER — CEFAZOLIN SODIUM-DEXTROSE 2-3 GM-% IV SOLR
INTRAVENOUS | Status: AC
  Filled 2015-02-25: qty 50

## 2015-02-25 MED ORDER — FENTANYL CITRATE (PF) 100 MCG/2ML IJ SOLN: 25.0000 ug | INTRAMUSCULAR | Status: DC | PRN

## 2015-02-25 MED ORDER — PROPOFOL 500 MG/50ML IV EMUL
INTRAVENOUS | Status: DC | PRN
  Administered 2015-02-25: 100 ug/kg/min via INTRAVENOUS

## 2015-02-25 MED ORDER — ONDANSETRON HCL 4 MG/2ML IJ SOLN
INTRAMUSCULAR | Status: AC
  Filled 2015-02-25: qty 2

## 2015-02-25 MED ORDER — PROMETHAZINE HCL 25 MG/ML IJ SOLN: 6.2500 mg | INTRAMUSCULAR | Status: DC | PRN

## 2015-02-25 MED ORDER — BUPIVACAINE HCL (PF) 0.25 % IJ SOLN
INTRAMUSCULAR | Status: DC | PRN
  Administered 2015-02-25: 10 mL

## 2015-02-25 MED ORDER — LIDOCAINE HCL (PF) 0.5 % IJ SOLN
INTRAMUSCULAR | Status: DC | PRN
  Administered 2015-02-25: 175 mg via INTRAVENOUS

## 2015-02-25 MED ORDER — FENTANYL CITRATE (PF) 100 MCG/2ML IJ SOLN
INTRAMUSCULAR | Status: AC
  Filled 2015-02-25: qty 2

## 2015-02-25 MED ORDER — CEFAZOLIN SODIUM-DEXTROSE 2-3 GM-% IV SOLR
2.0000 g | INTRAVENOUS | Status: AC
  Administered 2015-02-25: 2 g via INTRAVENOUS

## 2015-02-25 MED ORDER — MIDAZOLAM HCL 2 MG/2ML IJ SOLN
INTRAMUSCULAR | Status: AC
  Filled 2015-02-25: qty 2

## 2015-02-25 MED ORDER — PROPOFOL 10 MG/ML IV BOLUS
INTRAVENOUS | Status: AC
  Filled 2015-02-25: qty 20

## 2015-02-25 MED ORDER — OXYCODONE-ACETAMINOPHEN 5-325 MG PO TABS: ORAL_TABLET | ORAL | Status: DC

## 2015-02-25 MED ORDER — LIDOCAINE HCL (CARDIAC) 20 MG/ML IV SOLN
INTRAVENOUS | Status: DC | PRN
  Administered 2015-02-25: 50 mg via INTRAVENOUS

## 2015-02-25 MED ORDER — MIDAZOLAM HCL 2 MG/2ML IJ SOLN
1.0000 mg | INTRAMUSCULAR | Status: DC | PRN
  Administered 2015-02-25 (×2): 1 mg via INTRAVENOUS

## 2015-02-25 MED ORDER — LACTATED RINGERS IV SOLN
INTRAVENOUS | Status: DC
  Administered 2015-02-25: 13:00:00 via INTRAVENOUS

## 2015-02-25 MED ORDER — SCOPOLAMINE 1 MG/3DAYS TD PT72: 1.0000 | MEDICATED_PATCH | Freq: Once | TRANSDERMAL | Status: DC

## 2015-02-25 MED ORDER — GLYCOPYRROLATE 0.2 MG/ML IJ SOLN: 0.2000 mg | Freq: Once | INTRAMUSCULAR | Status: DC | PRN

## 2015-02-25 MED ORDER — FENTANYL CITRATE (PF) 100 MCG/2ML IJ SOLN
50.0000 ug | INTRAMUSCULAR | Status: DC | PRN
  Administered 2015-02-25 (×2): 50 ug via INTRAVENOUS

## 2015-02-25 SURGICAL SUPPLY — 37 items
BANDAGE ELASTIC 3 VELCRO ST LF (GAUZE/BANDAGES/DRESSINGS) ×3 IMPLANT
BLADE SURG 15 STRL LF DISP TIS (BLADE) ×2 IMPLANT
BLADE SURG 15 STRL SS (BLADE) ×6
BNDG CMPR 9X4 STRL LF SNTH (GAUZE/BANDAGES/DRESSINGS) ×1
BNDG ESMARK 4X9 LF (GAUZE/BANDAGES/DRESSINGS) ×2 IMPLANT
BNDG GAUZE ELAST 4 BULKY (GAUZE/BANDAGES/DRESSINGS) ×3 IMPLANT
CHLORAPREP W/TINT 26ML (MISCELLANEOUS) ×3 IMPLANT
CORDS BIPOLAR (ELECTRODE) ×3 IMPLANT
COVER BACK TABLE 60X90IN (DRAPES) ×3 IMPLANT
COVER MAYO STAND STRL (DRAPES) ×3 IMPLANT
CUFF TOURNIQUET SINGLE 18IN (TOURNIQUET CUFF) ×3 IMPLANT
DRAPE EXTREMITY T 121X128X90 (DRAPE) ×3 IMPLANT
DRAPE SURG 17X23 STRL (DRAPES) ×3 IMPLANT
DRSG PAD ABDOMINAL 8X10 ST (GAUZE/BANDAGES/DRESSINGS) ×3 IMPLANT
GAUZE SPONGE 4X4 12PLY STRL (GAUZE/BANDAGES/DRESSINGS) ×3 IMPLANT
GAUZE XEROFORM 1X8 LF (GAUZE/BANDAGES/DRESSINGS) ×3 IMPLANT
GLOVE BIO SURGEON STRL SZ7.5 (GLOVE) ×3 IMPLANT
GLOVE BIOGEL PI IND STRL 7.0 (GLOVE) IMPLANT
GLOVE BIOGEL PI IND STRL 8 (GLOVE) ×1 IMPLANT
GLOVE BIOGEL PI INDICATOR 7.0 (GLOVE) ×4
GLOVE BIOGEL PI INDICATOR 8 (GLOVE) ×2
GLOVE ECLIPSE 6.5 STRL STRAW (GLOVE) ×2 IMPLANT
GOWN STRL REUS W/ TWL LRG LVL3 (GOWN DISPOSABLE) ×1 IMPLANT
GOWN STRL REUS W/TWL LRG LVL3 (GOWN DISPOSABLE) ×3
GOWN STRL REUS W/TWL XL LVL3 (GOWN DISPOSABLE) ×3 IMPLANT
NDL HYPO 25X1 1.5 SAFETY (NEEDLE) IMPLANT
NEEDLE HYPO 25X1 1.5 SAFETY (NEEDLE) IMPLANT
NS IRRIG 1000ML POUR BTL (IV SOLUTION) ×3 IMPLANT
PACK BASIN DAY SURGERY FS (CUSTOM PROCEDURE TRAY) ×3 IMPLANT
PADDING CAST ABS 4INX4YD NS (CAST SUPPLIES)
PADDING CAST ABS COTTON 4X4 ST (CAST SUPPLIES) ×1 IMPLANT
STOCKINETTE 4X48 STRL (DRAPES) ×3 IMPLANT
SUT ETHILON 4 0 PS 2 18 (SUTURE) ×3 IMPLANT
SYR BULB 3OZ (MISCELLANEOUS) ×3 IMPLANT
SYR CONTROL 10ML LL (SYRINGE) IMPLANT
TOWEL OR 17X24 6PK STRL BLUE (TOWEL DISPOSABLE) ×6 IMPLANT
UNDERPAD 30X30 (UNDERPADS AND DIAPERS) ×1 IMPLANT

## 2015-02-25 NOTE — H&P (Signed)
Vincent [name] is an 49 y.o. male.   Chief Complaint: carpal tunnel syndrome HPI: 49 yo male with history of carpal tunnel syndrome has had previous right carpal tunnel release with recurrence.  No previous left release.  He has positive nerve conduction studies.  He wishes to have a left carpal tunnel release.  Past Medical History  Diagnosis Date  . COPD (chronic obstructive pulmonary disease) ([place])   . GERD (gastroesophageal reflux disease)   . Neuropathy in diabetes ([place])     LOWER EXTREMITIES  . Hypothyroidism   . History of TIA (transient ischemic attack) 2008--- NO RESIDUAL  . Diabetes mellitus, type 2 ([place])   . H/O hiatal hernia   . History of kidney stones   . Urgency of urination   . Gastroparesis   . Hyperlipidemia   . History of gastric ulcer   . Chronic pain syndrome   . Bladder cancer ([place])   . Urinary hesitancy   . At risk for sleep apnea     STOP-BANG=  5       SENT TO PCP 02-24-2014  . Bipolar 1 disorder ([place])   . Arthritis   . Memory loss   . High cholesterol   . Anxiety   . Seizures ([place])     ? sz,"spaces out"  . Carpal tunnel syndrome   . Stroke [facility])     TIA    Past Surgical History  Procedure Laterality Date  . Rotator cuff repair Right 12/2004  . Carpal tunnel release Right 09-16-2003  . Laparoscopic cholecystectomy  11-17-2010  . Cardiac catheterization  12-27-2001  DR Einar Gip  &  05-26-2009  DR VARANASI    RESULTS FOR BOTH ARE NORMAL CORONARIES AND PERSERVED LVF/ EF 60%  . Transurethral resection of bladder tumor N/A 08/09/2012    Procedure: TRANSURETHRAL RESECTION OF BLADDER TUMOR (TURBT) WITH GYRUS WITH MITOMYCIN C;  Surgeon: Claybon Jabs, MD;  Location: [facility];  Service: Urology;  Laterality: N/A;  . Cystoscopy N/A 10/10/2012    Procedure: CYSTOSCOPY CLOT EVACUATION FULGERATION OF BLEEDERS ;  Surgeon: Claybon Jabs, MD;  Location: [place];  Service: Urology;  Laterality: N/A;  . Transurethral resection  of bladder tumor with gyrus (turbt-gyrus) N/A 02/27/2014    Procedure: TRANSURETHRAL RESECTION OF BLADDER TUMOR WITH GYRUS (TURBT-GYRUS);  Surgeon: Claybon Jabs, MD;  Location: [facility];  Service: Urology;  Laterality: N/A;  . Cholecystectomy      Family History  Problem Relation Age of Onset  . Diabetes Mother   . Diabetes Father   . Hypertension Father   . Heart attack Father   . Heart attack Mother    Social History:  reports that he has been smoking Cigarettes.  He has a 40 pack-year smoking history. He has never used smokeless tobacco. He reports that he drinks alcohol. He reports that he does not use illicit drugs.  Allergies:  Allergies  Allergen Reactions  . Celebrex [Celecoxib] Anaphylaxis and Rash  . Hydrocodone Rash and Other (See Comments)    "blisters developed on arms"  . Sulfa Antibiotics Rash    Medications Prior to Admission  Medication Sig Dispense Refill  . atorvastatin (LIPITOR) 10 MG tablet Take 10 mg by mouth daily.    . fenofibrate 160 MG tablet Take 160 mg by mouth daily.    . fentaNYL (DURAGESIC - DOSED MCG/HR) 100 MCG/HR Place 100 mcg onto the skin every other day.     Marland Kitchen  fish oil-omega-3 fatty acids 1000 MG capsule Take 1,000-2,000 mg by mouth 2 (two) times daily. Take 1 capsule in the morning and 2 capsules in the evening    . folic acid (FOLVITE) 1 MG tablet Take 2 mg by mouth daily.    Marland Kitchen gabapentin (NEURONTIN) 600 MG tablet Take 600 mg by mouth 4 (four) times daily.    . Insulin Lispro Prot & Lispro (HUMALOG MIX 75/25 KWIKPEN) (75-25) 100 UNIT/ML Kwikpen Inject 150 units at breakfast and 70 units at supper. 20 pen 3  . lamoTRIgine (LAMICTAL) 100 MG tablet Take one tablet in the am and two tablets at bedtime. 90 tablet 11  . levothyroxine (SYNTHROID, LEVOTHROID) 25 MCG tablet Take 25 mcg by mouth daily before breakfast.     . lithium carbonate (LITHOBID) 300 MG CR tablet Take 450 mg by mouth 2 (two) times daily.     . methotrexate  (RHEUMATREX) 2.5 MG tablet Take 2.5 mg by mouth once a week. Taking 5 tablets weekly. Caution:Chemotherapy. Protect from light.    Marland Kitchen omeprazole (PRILOSEC) 20 MG capsule Take 20 mg by mouth daily.    . pregabalin (LYRICA) 50 MG capsule Take 100 mg by mouth 3 (three) times daily.       Results for orders placed or performed during the hospital encounter of 02/25/15 (from the past 48 hour(s))  Glucose, capillary     Status: Abnormal   Collection Time: 02/25/15 11:55 AM  Result Value Ref Range   Glucose-Capillary 122 (H) 65 - 99 mg/dL    No results found.   A comprehensive review of systems was negative.  Blood pressure 128/74, pulse 65, temperature 97.7 F (36.5 C), temperature source Oral, resp. rate 18, height 6' (1.829 m), weight 108.013 kg (238 lb 2 oz), SpO2 100 %.  General appearance: alert, cooperative and appears stated age Head: Normocephalic, without obvious abnormality, atraumatic Neck: supple, symmetrical, trachea midline Resp: clear to auscultation bilaterally Cardio: regular rate and rhythm GI: non tender Extremities: decreased sensation, brisk capillary refill all digits.  +epl/fpl/io.  no wounds. Pulses: 2+ and symmetric Skin: Skin color, texture, turgor normal. No rashes or lesions Neurologic: Grossly normal Incision/Wound: none  Assessment/Plan Left carpal tunnel syndrome.  Non operative and operative treatment options were discussed with the patient and patient wishes to proceed with operative treatment. Risks, benefits, and alternatives of surgery were discussed and the patient agrees with the plan of care.   Wilba Mutz R 02/25/2015, 12:41 PM

## 2015-02-25 NOTE — Op Note (Signed)
[id number] 

## 2015-02-25 NOTE — Transfer of Care (Signed)
Immediate Anesthesia Transfer of Care Note  Patient: Vincent [name]  Procedure(s) Performed: Procedure(s): LEFT CARPAL TUNNEL RELEASE (Left)  Patient Location: PACU  Anesthesia Type:Bier block  Level of Consciousness: awake, sedated and patient cooperative  Airway & Oxygen Therapy: Patient Spontanous Breathing and Patient connected to face mask oxygen  Post-op Assessment: Report given to RN and Post -op Vital signs reviewed and stable  Post vital signs: Reviewed and stable  Last Vitals:  Filed Vitals:   02/25/15 1117  BP: 128/74  Pulse: 65  Temp: 36.5 C  Resp: 18    Complications: No apparent anesthesia complications

## 2015-02-25 NOTE — Anesthesia Postprocedure Evaluation (Signed)
Anesthesia Post Note  Patient: Vincent [name]  Procedure(s) Performed: Procedure(s) (LRB): LEFT CARPAL TUNNEL RELEASE (Left)  Patient location during evaluation: PACU Anesthesia Type: MAC and Bier Block Level of consciousness: awake and alert Pain management: pain level controlled Vital Signs Assessment: post-procedure vital signs reviewed and stable Respiratory status: spontaneous breathing Cardiovascular status: blood pressure returned to baseline Anesthetic complications: no    Last Vitals:  Filed Vitals:   02/25/15 1430 02/25/15 1450  BP: 122/79 145/75  Pulse: 65 64  Temp:  36.7 C  Resp: 16 16    Last Pain:  Filed Vitals:   02/25/15 1516  PainSc: 0-No pain                 Tiajuana Amass

## 2015-02-25 NOTE — Anesthesia Preprocedure Evaluation (Addendum)
Anesthesia Evaluation  Patient identified by MRN, date of birth, ID band Patient awake    Reviewed: Allergy & Precautions, H&P , NPO status , Patient's Chart, lab work & pertinent test results  History of Anesthesia Complications Negative for: history of anesthetic complications  Airway Mallampati: III  TM Distance: >3 FB Neck ROM: Full  Mouth opening: Limited Mouth Opening  Dental  (+) Dental Advisory Given, Edentulous Upper, Edentulous Lower   Pulmonary COPD,  COPD inhaler, Current Smoker (1.5-2 PPD),    Pulmonary exam normal breath sounds clear to auscultation       Cardiovascular Exercise Tolerance: Good (-) hypertension(-) angina(-) CAD and (-) Past MI negative cardio ROS Normal cardiovascular exam Rhythm:Regular Rate:Normal     Neuro/Psych Seizures -, Well Controlled,  PSYCHIATRIC DISORDERS Anxiety Bipolar Disorder Carpal tunnel syndrome TIA   GI/Hepatic Neg liver ROS, hiatal hernia, GERD  Medicated and Controlled,  Endo/Other  diabetes, Poorly Controlled, Type 2, Insulin DependentHypothyroidism Obesity   Renal/GU negative Renal ROS  negative genitourinary   Musculoskeletal  (+) Arthritis ,   Abdominal   Peds negative pediatric ROS (+)  Hematology  (+) Blood dyscrasia, anemia ,   Anesthesia Other Findings   Reproductive/Obstetrics negative OB ROS                          Anesthesia Physical Anesthesia Plan  ASA: III  Anesthesia Plan: MAC and Bier Block   Post-op Pain Management:    Induction: Intravenous  Airway Management Planned: Nasal Cannula  Additional Equipment:   Intra-op Plan:   Post-operative Plan:   Informed Consent: I have reviewed the patients History and Physical, chart, labs and discussed the procedure including the risks, benefits and alternatives for the proposed anesthesia with the patient or authorized representative who has indicated his/her  understanding and acceptance.   Dental advisory given  Plan Discussed with: CRNA  Anesthesia Plan Comments: (MAC sedation with Bier Block)       Anesthesia Quick Evaluation

## 2015-02-25 NOTE — Discharge Instructions (Addendum)

## 2015-02-25 NOTE — Brief Op Note (Signed)
02/25/2015  1:32 PM  PATIENT:  Vincent [name]  49 y.o. male  PRE-OPERATIVE DIAGNOSIS:  LEFT CARPAL TUNNEL SYNDROME  POST-OPERATIVE DIAGNOSIS:  LEFT CARPAL TUNNEL SYNDROME  PROCEDURE:  Procedure(s): LEFT CARPAL TUNNEL RELEASE (Left)  SURGEON:  Surgeon(s) and Role:    * Leanora Cover, MD - Primary  PHYSICIAN ASSISTANT:   ASSISTANTS: none   ANESTHESIA:   Bier block with sedation  EBL:     BLOOD ADMINISTERED:none  DRAINS: none   LOCAL MEDICATIONS USED:  MARCAINE     SPECIMEN:  No Specimen  DISPOSITION OF SPECIMEN:  N/A  COUNTS:  YES  TOURNIQUET:   Total Tourniquet Time Documented: Forearm (Left) - 25 minutes Total: Forearm (Left) - 25 minutes   DICTATION: .Other Dictation: Dictation Number [id number]  PLAN OF CARE: Discharge to home after PACU  PATIENT DISPOSITION:  PACU - hemodynamically stable.

## 2015-02-26 ENCOUNTER — Encounter (HOSPITAL_BASED_OUTPATIENT_CLINIC_OR_DEPARTMENT_OTHER): Payer: Self-pay | Admitting: Orthopedic Surgery

## 2015-02-26 NOTE — Op Note (Signed)
Vincent [name], Vincent [name]               ACCOUNT NO.:  [id number]  MEDICAL RECORD NO.:  [id number]  LOCATION:                                 FACILITY:  PHYSICIAN:  Leanora Cover, MD             DATE OF BIRTH:  DATE OF PROCEDURE:  02/25/2015 DATE OF DISCHARGE:                              OPERATIVE REPORT   PREOPERATIVE DIAGNOSIS:  Left carpal tunnel syndrome.  POSTOPERATIVE DIAGNOSIS:  Left carpal tunnel syndrome.  PROCEDURE:  Left carpal tunnel release.  SURGEON:  Leanora Cover, MD  ASSISTANT:  None.  ANESTHESIA:  Bier block with sedation.  IV FLUIDS:  Per anesthesia flow sheet.  ESTIMATED BLOOD LOSS:  Minimal.  COMPLICATIONS:  None.  SPECIMENS:  None.  TOURNIQUET TIME:  25 minutes.  DISPOSITION:  Stable.  INDICATIONS:  Vincent [name] is a 49 year old male with history of carpal tunnel syndrome.  He has had it released on the right.  He wishes to have the left carpal tunnel release.  Risks, benefits and alternatives of the surgery were discussed including the risk of blood loss; infection; damage to nerves, vessels, tendons, ligaments, bone; failure of surgery; need for additional surgery; complications with wound healing; continued pain; recurrence of carpal tunnel syndrome; and damage to motor branch.  He voiced understanding of these risks and elected to proceed.  OPERATIVE COURSE:  After being identified preoperatively by myself, the patient and I agreed upon the procedure and site of procedure.  Surgical site was marked.  The risks, benefits, and alternatives of the surgery were reviewed and he wished to proceed.  Surgical consent had been signed.  He was transferred to the operating room and placed on the operating room table in supine position with the left upper extremity on an armboard.  Bier block anesthesia was induced by Anesthesiology.  Left upper extremity was prepped and draped in normal sterile orthopedic fashion.  A surgical pause was performed between the  surgeons, anesthesia and operating room staff, and all were in agreement as to the patient, procedure and site of procedure.  Tourniquet at the proximal aspect of the forearm had been inflated for the Bier block.  Incision was made over the transverse carpal ligament, carried into subcutaneous tissues by spreading technique.  Bipolar electrocautery was used to obtain hemostasis.  Transverse carpal ligament was identified.  It was sharply incised with knife.  It was incised distally first.  Care was taken to ensure complete decompression distally.  It was then incised proximally.  Scissors were used to split the distal aspect of the volar antebrachial fascia.  A finger was placed into the wound to ensure complete decompression, which was the case.  The nerve was inspected. It was hyperemic.  The motor branch was identified and was intact.  The wound was copiously irrigated with sterile saline.  It was then closed with 4-0 nylon in a horizontal mattress fashion.  It was injected with 10 mL of 0.25% plain Marcaine to aid in postoperative analgesia.  It was then dressed with sterile Xeroform, 4x4s, an ABD and wrapped with Kerlix and Ace bandage.  Tourniquet was deflated at 25 minutes.  The  fingertips were pink with brisk capillary refill after deflation of the tourniquet. Operative drapes were broken down and the patient was awoken from anesthesia safely.  He was transferred back to the stretcher and taken to PACU in stable condition.  I will see him back in the office in 1 week for postoperative followup.  I will give him Percocet 5/325, 1-2 p.o. q.6 hours p.r.n. pain, dispensed #30.     Leanora Cover, MD     KK/MEDQ  D:  02/25/2015  T:  02/26/2015  Job:  [id number]

## 2015-03-02 ENCOUNTER — Encounter: Payer: Self-pay | Admitting: Endocrinology

## 2015-03-02 ENCOUNTER — Ambulatory Visit (INDEPENDENT_AMBULATORY_CARE_PROVIDER_SITE_OTHER): Payer: 59 | Admitting: Endocrinology

## 2015-03-02 VITALS — BP 148/74 | HR 71 | Temp 97.7°F | Ht 73.0 in | Wt 244.0 lb

## 2015-03-02 DIAGNOSIS — Z794 Long term (current) use of insulin: Secondary | ICD-10-CM

## 2015-03-02 DIAGNOSIS — E1143 Type 2 diabetes mellitus with diabetic autonomic (poly)neuropathy: Secondary | ICD-10-CM

## 2015-03-02 LAB — POCT GLYCOSYLATED HEMOGLOBIN (HGB A1C): Hemoglobin A1C: 8

## 2015-03-02 MED ORDER — INSULIN LISPRO PROT & LISPRO (75-25 MIX) 100 UNIT/ML KWIKPEN: PEN_INJECTOR | SUBCUTANEOUS | Status: DC

## 2015-03-02 NOTE — Patient Instructions (Signed)
check your blood sugar twice a day.  vary the time of day when you check, between before the 3 meals, and at bedtime.  also check if you have symptoms of your blood sugar being too high or too low.  please keep a record of the readings and bring it to your next appointment here.  You can write it on any piece of paper.  please call us sooner if your blood sugar goes below 70, or if you have a lot of readings over 200.  please change the insulin to 160 units with breakfast (however, just take 100 if you are going to be active), and 60 units with the evening meal.   If you are unable to anticipate the activity, try to eat a light snack with the activity.   Please come back for a follow-up appointment in 3 months.

## 2015-03-02 NOTE — Progress Notes (Signed)
Subjective:    Patient ID: Vincent [name], male    DOB: [birth year], 49 y.o.   MRN: [id number]  HPI Pt returns for f/u of diabetes mellitus: DM type: Insulin-requiring type 2 Dx'ed: 99991111 Complications: polyneuropathy and gastroparesis Therapy: insulin since 2001 DKA: never Severe hypoglycemia: many episodes of severe hypoglycemia (most recent was Oct 2016) Pancreatitis: never Other: due to AMS and severe hypoglycemia, he is not a candidate for a1c < 7; he has chosen a BID insulin schedule. Interval history: no cbg record, but states cbg's vary from 55-300.  It is lowest at hs and in am.  pt states he feels well in general. Past Medical History  Diagnosis Date  . COPD (chronic obstructive pulmonary disease) ([place])   . GERD (gastroesophageal reflux disease)   . Neuropathy in diabetes ([place])     LOWER EXTREMITIES  . Hypothyroidism   . History of TIA (transient ischemic attack) 2008--- NO RESIDUAL  . Diabetes mellitus, type 2 ([place])   . H/O hiatal hernia   . History of kidney stones   . Urgency of urination   . Gastroparesis   . Hyperlipidemia   . History of gastric ulcer   . Chronic pain syndrome   . Bladder cancer ([place])   . Urinary hesitancy   . At risk for sleep apnea     STOP-BANG=  5       SENT TO PCP 02-24-2014  . Bipolar 1 disorder ([place])   . Arthritis   . Memory loss   . High cholesterol   . Anxiety   . Seizures ([place])     ? sz,"spaces out"  . Carpal tunnel syndrome   . Stroke [facility])     TIA    Past Surgical History  Procedure Laterality Date  . Rotator cuff repair Right 12/2004  . Carpal tunnel release Right 09-16-2003  . Laparoscopic cholecystectomy  11-17-2010  . Cardiac catheterization  12-27-2001  DR Einar Gip  &  05-26-2009  DR VARANASI    RESULTS FOR BOTH ARE NORMAL CORONARIES AND PERSERVED LVF/ EF 60%  . Transurethral resection of bladder tumor N/A 08/09/2012    Procedure: TRANSURETHRAL RESECTION OF BLADDER TUMOR (TURBT) WITH GYRUS WITH MITOMYCIN C;  Surgeon:  Claybon Jabs, MD;  Location: [facility];  Service: Urology;  Laterality: N/A;  . Cystoscopy N/A 10/10/2012    Procedure: CYSTOSCOPY CLOT EVACUATION FULGERATION OF BLEEDERS ;  Surgeon: Claybon Jabs, MD;  Location: [place];  Service: Urology;  Laterality: N/A;  . Transurethral resection of bladder tumor with gyrus (turbt-gyrus) N/A 02/27/2014    Procedure: TRANSURETHRAL RESECTION OF BLADDER TUMOR WITH GYRUS (TURBT-GYRUS);  Surgeon: Claybon Jabs, MD;  Location: [facility];  Service: Urology;  Laterality: N/A;  . Cholecystectomy    . Carpal tunnel release Left 02/25/2015    Procedure: LEFT CARPAL TUNNEL RELEASE;  Surgeon: Leanora Cover, MD;  Location: [place];  Service: Orthopedics;  Laterality: Left;    Social History   Social History  . Marital Status: Married    Spouse Name: N/A  . Number of Children: 3  . Years of Education: GED   Occupational History  . Warden/ranger of company   Social History Main Topics  . Smoking status: Current Every Day Smoker -- 2.00 packs/day for 20 years    Types: Cigarettes  . Smokeless tobacco: Never Used  . Alcohol Use: Yes  Comment: RARE  . Drug Use: No  . Sexual Activity: Not on file   Other Topics Concern  . Not on file   Social History Narrative   Lives at home with his wife and children.   Left-handed.   6-8 cups caffeine per day.    Current Outpatient Prescriptions on File Prior to Visit  Medication Sig Dispense Refill  . atorvastatin (LIPITOR) 10 MG tablet Take 10 mg by mouth daily.    . fenofibrate 160 MG tablet Take 160 mg by mouth daily.    . fentaNYL (DURAGESIC - DOSED MCG/HR) 100 MCG/HR Place 100 mcg onto the skin every other day.     . fish oil-omega-3 fatty acids 1000 MG capsule Take 1,000-2,000 mg by mouth 2 (two) times daily. Take 1 capsule in the morning and 2 capsules in the evening    . folic acid (FOLVITE) 1 MG tablet Take 2  mg by mouth daily.    Marland Kitchen gabapentin (NEURONTIN) 600 MG tablet Take 600 mg by mouth 4 (four) times daily.    Marland Kitchen lamoTRIgine (LAMICTAL) 100 MG tablet Take one tablet in the am and two tablets at bedtime. 90 tablet 11  . levothyroxine (SYNTHROID, LEVOTHROID) 25 MCG tablet Take 25 mcg by mouth daily before breakfast.     . lithium carbonate (LITHOBID) 300 MG CR tablet Take 450 mg by mouth 2 (two) times daily.     . methotrexate (RHEUMATREX) 2.5 MG tablet Take 2.5 mg by mouth once a week. Taking 5 tablets weekly. Caution:Chemotherapy. Protect from light.    Marland Kitchen omeprazole (PRILOSEC) 20 MG capsule Take 20 mg by mouth daily.    Marland Kitchen oxyCODONE-acetaminophen (PERCOCET) 5-325 MG tablet 1-2 tabs po q6 hours prn pain 30 tablet 0  . pregabalin (LYRICA) 50 MG capsule Take 100 mg by mouth 3 (three) times daily.      No current facility-administered medications on file prior to visit.    Allergies  Allergen Reactions  . Celebrex [Celecoxib] Anaphylaxis and Rash  . Hydrocodone Rash and Other (See Comments)    "blisters developed on arms"  . Sulfa Antibiotics Rash    Family History  Problem Relation Age of Onset  . Diabetes Mother   . Diabetes Father   . Hypertension Father   . Heart attack Father   . Heart attack Mother     BP 148/74 mmHg  Pulse 71  Temp(Src) 97.7 F (36.5 C) (Oral)  Ht 6\' 1"  (1.854 m)  Wt 244 lb (110.678 kg)  BMI 32.20 kg/m2  SpO2 98%  Review of Systems Denies LOC    Objective:   Physical Exam VITAL SIGNS:  See vs page GENERAL: no distress Pulses: dorsalis pedis intact bilat.   MSK: no deformity of the feet CV: no leg edema Skin:  no ulcer on the feet.  normal color and temp on the feet. Neuro: sensation is intact to touch on the feet.   Lab Results  Component Value Date   HGBA1C 8.0 03/02/2015       Assessment & Plan:  DM: The pattern of his cbg's indicates he needs some adjustment in his therapy.  Patient is advised the following: Patient Instructions  check  your blood sugar twice a day.  vary the time of day when you check, between before the 3 meals, and at bedtime.  also check if you have symptoms of your blood sugar being too high or too low.  please keep a record of the readings and bring it  to your next appointment here.  You can write it on any piece of paper.  please call us sooner if your blood sugar goes below 70, or if you have a lot of readings over 200.  please change the insulin to 160 units with breakfast (however, just take 100 if you are going to be active), and 60 units with the evening meal.   If you are unable to anticipate the activity, try to eat a light snack with the activity.   Please come back for a follow-up appointment in 3 months.

## 2015-03-03 ENCOUNTER — Ambulatory Visit
Admission: RE | Admit: 2015-03-03 | Discharge: 2015-03-03 | Disposition: A | Discharge status: Home / Self Care | Payer: 59 | Source: Ambulatory Visit | Attending: Family Medicine | Admitting: Family Medicine

## 2015-03-03 ENCOUNTER — Other Ambulatory Visit: Payer: Self-pay | Admitting: Family Medicine

## 2015-03-03 DIAGNOSIS — R0600 Dyspnea, unspecified: Secondary | ICD-10-CM

## 2015-03-18 ENCOUNTER — Other Ambulatory Visit: Payer: Self-pay | Admitting: *Deleted

## 2015-03-18 ENCOUNTER — Telehealth: Payer: Self-pay | Admitting: Neurology

## 2015-03-18 ENCOUNTER — Ambulatory Visit (INDEPENDENT_AMBULATORY_CARE_PROVIDER_SITE_OTHER): Payer: 59 | Admitting: Neurology

## 2015-03-18 ENCOUNTER — Encounter: Payer: Self-pay | Admitting: Neurology

## 2015-03-18 VITALS — BP 134/81 | HR 75 | Ht 73.0 in | Wt 244.0 lb

## 2015-03-18 DIAGNOSIS — F32A Depression, unspecified: Secondary | ICD-10-CM | POA: Insufficient documentation

## 2015-03-18 DIAGNOSIS — G4733 Obstructive sleep apnea (adult) (pediatric): Secondary | ICD-10-CM

## 2015-03-18 DIAGNOSIS — F3181 Bipolar II disorder: Secondary | ICD-10-CM | POA: Insufficient documentation

## 2015-03-18 DIAGNOSIS — F329 Major depressive disorder, single episode, unspecified: Secondary | ICD-10-CM | POA: Diagnosis not present

## 2015-03-18 DIAGNOSIS — R404 Transient alteration of awareness: Secondary | ICD-10-CM

## 2015-03-18 DIAGNOSIS — F319 Bipolar disorder, unspecified: Secondary | ICD-10-CM | POA: Insufficient documentation

## 2015-03-18 DIAGNOSIS — Z794 Long term (current) use of insulin: Secondary | ICD-10-CM | POA: Diagnosis not present

## 2015-03-18 DIAGNOSIS — E1143 Type 2 diabetes mellitus with diabetic autonomic (poly)neuropathy: Secondary | ICD-10-CM

## 2015-03-18 MED ORDER — LAMICTAL 100 MG PO TABS: 200.0000 mg | ORAL_TABLET | Freq: Two times a day (BID) | ORAL | Status: DC

## 2015-03-18 NOTE — Telephone Encounter (Signed)
Pt's wife called and would like to know if the physician was going to send in a rx for lamotrigine. Pt was seen in the office today. Please call and advise

## 2015-03-18 NOTE — Progress Notes (Signed)
Chief Complaint  Patient presents with  . Transient alteration of awareness    He is here with his wife, Vincent [name].  He has continued to have episodes despite taking lamotrigine, as prescribed.  His most recent event was this morning.  He has also developed bilateral hand tremors causing him difficulty eating and dressing himself.      PATIENT: Vincent [name] DOB: Jul 03, 1965  Chief Complaint  Patient presents with  . Transient alteration of awareness    He is here with his wife, Vincent [name].  He has continued to have episodes despite taking lamotrigine, as prescribed.  His most recent event was this morning.  He has also developed bilateral hand tremors causing him difficulty eating and dressing himself.     HISTORICAL  Vincent [name] is a 50 years old left-handed male, accompanied by his wife Vincent [name], seen in refer by his primary care physician Dr. Claris [name] for evaluation of transient confusion episodes  Chart reviewed, he had past medical history of hypertension, hyperlipidemia, diabetes, insulin-dependent, history of bladder cancer, bipolar disorder, is taking Lithium, chronic pain, on fentanyl patch, longtime smoker, 2 packs per day  Since November 2015, he had multiple recurrent episode of confusion, sometimes he forgets that he has to take out his trash can, repeating his questions, was also noted by family to have staring spells, confusion can last up to 8 hours, there was no loss of consciousness  Some of the episode has acute onset, in March 05 2014, he went outside of the yard after smoking a cigarette, he suddenly became confused, repeating his questions, agitated, combative, "as if he is on drugs " , the confusion last about 24 hours, he was taken by ambulance to the emergency room, UDS was negative,  Lab reviewed, appetite is panel was negative, HIV was negative, ammonia was mildly elevated, it was considered due to polypharmacy fentanyl patch, Dilaudid, Ultram,  Neurontin  He reported history of head trauma in 2008, his right frontal region was hit by a rotatory plate, transient loss of consciousness, with skin abrasion, had multiple stitches.   have personally reviewed MRI of the brain with and without contrast December 2015 that was normal, EEG was normal   UPDATE Oct 5th 2016: EEG was normal, he tolerated lamotrigine very well, while he was taking 100 mg twice a day, he has much less current episode, only had 3 episode since September 2016, short lasting, mild confusion,   I also watched the video tape March 05 2014, patient was agitated, confused, but no dysarthria, no lateralized motor deficit noticed during the video tape, he was searching through the knife Holder for his keys, could not find towels at his home   His wife reported recurrent events, August 24, he has prolonged tremor of his left arm, complains everything look orange, very sleepy afterwards November 06 2014, black stairs, lasting for 10 minutes, disoriented for the rest of the night, fell to sleep during dinnertime August 27, he complains of feeling strange, took a long nap, irritable afterwords August 30 first, disoriented confused, not sure where he was, fell to sleep, broke out in cold sweat afterwards September 2, he slept outside in the chair, woke up slumped to the floor, face looked droopy, September 6, he complains of confused, feel funny, September 10, complains of confused not feeling well,  He had no recurrent episode ever since. His mood has improved with lamotrigine as well   UPDATE Mar 18 2015: He had few episodes since  last visit, confused, mild slurred speech, slow talking, everything goes slow motion while taking The more he use his hands, the more shaky he got. He is now taking lamotrigine 100 mg in the morning, 200 mg at nighttime  He also complains of difficulty sleeping, snoring, excessive daytime sleepiness, fatigue, ESS score is 15, FSS score is  57  REVIEW OF SYSTEMS: Full 14 system review of systems performed and notable only for fatigue, ringing ears, blurred vision, wheezing, shortness of breath, constipation, nausea, insomnia, frequent awakening, daytime sleepiness, joint pain, joint swelling, achy muscles, muscle cramps, memory loss, dizziness, seizure, speech difficulty, tremor, agitation, confusion, depression, anxiety  ALLERGIES: Allergies  Allergen Reactions  . Celebrex [Celecoxib] Anaphylaxis and Rash  . Hydrocodone Rash and Other (See Comments)    "blisters developed on arms"  . Sulfa Antibiotics Rash    HOME MEDICATIONS: Current Outpatient Prescriptions  Medication Sig Dispense Refill  . atorvastatin (LIPITOR) 10 MG tablet Take 10 mg by mouth daily.    . fenofibrate 160 MG tablet Take 160 mg by mouth daily.    . fentaNYL (DURAGESIC - DOSED MCG/HR) 100 MCG/HR Place 100 mcg onto the skin every other day.     . fish oil-omega-3 fatty acids 1000 MG capsule Take 1,000-2,000 mg by mouth 2 (two) times daily. Take 1 capsule in the morning and 2 capsules in the evening    . gabapentin (NEURONTIN) 600 MG tablet Take 600 mg by mouth 4 (four) times daily.    . insulin aspart protamine- aspart (NOVOLOG MIX 70/30) (70-30) 100 UNIT/ML injection Inject 80 Units into the skin 2 (two) times daily with a meal. Takes 60 units with breakfast, 20 units with lunch and 60 units with dinner    . levothyroxine (SYNTHROID, LEVOTHROID) 25 MCG tablet Take 25 mcg by mouth daily before breakfast.     . lithium carbonate (LITHOBID) 300 MG CR tablet Take by mouth 2 (two) times daily.    . pregabalin (LYRICA) 50 MG capsule Take 50 mg by mouth 3 (three) times daily.       PAST MEDICAL HISTORY: Past Medical History  Diagnosis Date  . COPD (chronic obstructive pulmonary disease) ([place])   . GERD (gastroesophageal reflux disease)   . Neuropathy in diabetes ([place])     LOWER EXTREMITIES  . Hypothyroidism   . History of TIA (transient ischemic attack)  2008--- NO RESIDUAL  . Diabetes mellitus, type 2 ([place])   . H/O hiatal hernia   . History of kidney stones   . Urgency of urination   . Gastroparesis   . Hyperlipidemia   . History of gastric ulcer   . Chronic pain syndrome   . Bladder cancer ([place])   . Urinary hesitancy   . At risk for sleep apnea     STOP-BANG=  5       SENT TO PCP 02-24-2014  . Bipolar 1 disorder ([place])   . Arthritis   . Memory loss   . High cholesterol   . Anxiety   . Seizures ([place])     ? sz,"spaces out"  . Carpal tunnel syndrome   . Stroke [facility])     TIA    PAST SURGICAL HISTORY: Past Surgical History  Procedure Laterality Date  . Rotator cuff repair Right 12/2004  . Carpal tunnel release Right 09-16-2003  . Laparoscopic cholecystectomy  11-17-2010  . Cardiac catheterization  12-27-2001  DR Einar Gip  &  05-26-2009  DR Irish Lack    RESULTS FOR BOTH ARE  NORMAL CORONARIES AND PERSERVED LVF/ EF 60%  . Transurethral resection of bladder tumor N/A 08/09/2012    Procedure: TRANSURETHRAL RESECTION OF BLADDER TUMOR (TURBT) WITH GYRUS WITH MITOMYCIN C;  Surgeon: Claybon Jabs, MD;  [facility];  Service: Urology;  Laterality: N/A;  . Cystoscopy N/A 10/10/2012    Procedure: CYSTOSCOPY CLOT EVACUATION FULGERATION OF BLEEDERS ;  Surgeon: Claybon Jabs, MD;  Location: [place];  Service: Urology;  Laterality: N/A;  . Transurethral resection of bladder tumor with gyrus (turbt-gyrus) N/A 02/27/2014    Procedure: TRANSURETHRAL RESECTION OF BLADDER TUMOR WITH GYRUS (TURBT-GYRUS);  Surgeon: Claybon Jabs, MD;  Location: [facility];  Service: Urology;  Laterality: N/A;  . Cholecystectomy    . Carpal tunnel release Left 02/25/2015    Procedure: LEFT CARPAL TUNNEL RELEASE;  Surgeon: Leanora Cover, MD;  Location: [place];  Service: Orthopedics;  Laterality: Left;    FAMILY HISTORY: Family History  Problem Relation Age of Onset  . Diabetes Mother   .  Diabetes Father   . Hypertension Father   . Heart attack Father   . Heart attack Mother     SOCIAL HISTORY:  Social History   Social History  . Marital Status: Married    Spouse Name: N/A  . Number of Children: 3  . Years of Education: GED   Occupational History  . Warden/ranger of company   Social History Main Topics  . Smoking status: Current Every Day Smoker -- 2.00 packs/day for 20 years    Types: Cigarettes  . Smokeless tobacco: Never Used  . Alcohol Use: Yes     Comment: RARE  . Drug Use: No  . Sexual Activity: Not on file   Other Topics Concern  . Not on file   Social History Narrative   Lives at home with his wife and children.   Left-handed.   6-8 cups caffeine per day.     PHYSICAL EXAM   Filed Vitals:   03/18/15 1107  BP: 134/81  Pulse: 75  Height: 6\' 1"  (1.854 m)  Weight: 244 lb (110.678 kg)    Not recorded      Body mass index is 32.2 kg/(m^2).  PHYSICAL EXAMNIATION:  Gen: NAD, conversant, well nourised, obese, well groomed                     Cardiovascular: Regular rate rhythm, no peripheral edema, warm, nontender. Eyes: Conjunctivae clear without exudates or hemorrhage Neck: Supple, no carotid bruise. Pulmonary: Clear to auscultation bilaterally   NEUROLOGICAL EXAM:  MENTAL STATUS: Speech:    Speech is normal; fluent and spontaneous with normal comprehension.   Cognition:  Tired looking middle-age male     He is oriented to date, month and place     Recent and remote memory: He missed 2 out of 3 recalls     Normal Attention span and concentration     Normal Language, naming, repeating,spontaneous speech     Fund of knowledge   CRANIAL NERVES: CN II: Visual fields are full to confrontation. Fundoscopic exam is normal with sharp discs and no vascular changes. Pupils are round equal and briskly reactive to light. CN III, IV, VI: extraocular movement are normal. No ptosis. CN V: Facial sensation is  intact to pinprick in all 3 divisions bilaterally. Corneal responses are intact.  CN VII: Face is symmetric with normal eye closure and  smile. CN VIII: Hearing is normal to rubbing fingers CN IX, X: Palate elevates symmetrically. Phonation is normal. CN XI: Head turning and shoulder shrug are intact CN XII: Tongue is midline with normal movements and no atrophy. Narrow oropharyngeal  MOTOR: There is no pronator drift of out-stretched arms. Muscle bulk and tone are normal. Muscle strength is normal.  REFLEXES: Reflexes are 2+ and symmetric at the biceps, triceps, knees, and ankles. Plantar responses are flexor.  SENSORY: Intact to light touch, pinprick, position sense, and vibration sense are intact in fingers and toes.  COORDINATION: Rapid alternating movements and fine finger movements are intact. There is no dysmetria on finger-to-nose and heel-knee-shin.    GAIT/STANCE: Posture is normal. Able to do tandem walking without difficulty Romberg is absent.   DIAGNOSTIC DATA (LABS, IMAGING, TESTING) - I reviewed patient records, labs, notes, testing and imaging myself where available.   ASSESSMENT AND PLAN  STEFON BENALLY is a 50 y.o. male with history of chronic pain, head trauma, bipolar disorder, presenting with recurrent episodes of confusion since November 2015. Recurrent episode of confusion  Differentiation diagnosis remains complex partial seizure versus mood disorder,   He responded very well to lamotrigine, will titrate up the dosage to 200 mg twice a day  Document all the event, repeat EEG  If he continue have recurrent episode, may consider video EEG monitoring  Obstructive sleep apnea  Sleep study refer  Marcial Pacas, M.D. Ph.D.  [facility] [address] [place], [place] [address] Ph: [phone] Fax: [phone]  CC: Dr. Claris [name]

## 2015-03-18 NOTE — Telephone Encounter (Signed)
Spoke to patient's wife - she is aware the rx was sent today.

## 2015-03-19 MED ORDER — LAMOTRIGINE 100 MG PO TABS: 200.0000 mg | ORAL_TABLET | Freq: Two times a day (BID) | ORAL | Status: DC

## 2015-03-19 NOTE — Telephone Encounter (Signed)
Rx has been resent 

## 2015-03-19 NOTE — Telephone Encounter (Signed)
Wife called to advise Pharmacy received Rx for lamotrigine as BRAND only, patient needs generic.

## 2015-03-21 ENCOUNTER — Telehealth: Payer: Self-pay

## 2015-03-21 NOTE — Telephone Encounter (Signed)
Called pt to advise him that the office will be closed tomorrow 03/22/15. No answer. Left a message at [phone] advising the pt that [facility] is closed tomorrow and to please call back on 03/23/15 to reschedule. [place] [facility] phone number.

## 2015-03-22 ENCOUNTER — Institutional Professional Consult (permissible substitution): Payer: 59 | Admitting: Neurology

## 2015-03-22 NOTE — Telephone Encounter (Signed)
Called pt again to get his 1/9 appt rescheduled, no answer, already left a message yesterday.

## 2015-03-24 ENCOUNTER — Ambulatory Visit (INDEPENDENT_AMBULATORY_CARE_PROVIDER_SITE_OTHER): Payer: 59 | Admitting: Neurology

## 2015-03-24 ENCOUNTER — Encounter: Payer: Self-pay | Admitting: Neurology

## 2015-03-24 ENCOUNTER — Institutional Professional Consult (permissible substitution): Payer: 59 | Admitting: Neurology

## 2015-03-24 VITALS — BP 150/80 | HR 64 | Resp 20 | Ht 72.0 in | Wt 241.0 lb

## 2015-03-24 DIAGNOSIS — F3162 Bipolar disorder, current episode mixed, moderate: Secondary | ICD-10-CM

## 2015-03-24 DIAGNOSIS — G40401 Other generalized epilepsy and epileptic syndromes, not intractable, with status epilepticus: Secondary | ICD-10-CM

## 2015-03-24 DIAGNOSIS — G4730 Sleep apnea, unspecified: Secondary | ICD-10-CM | POA: Diagnosis not present

## 2015-03-24 DIAGNOSIS — R0683 Snoring: Secondary | ICD-10-CM

## 2015-03-24 DIAGNOSIS — G4710 Hypersomnia, unspecified: Secondary | ICD-10-CM

## 2015-03-24 DIAGNOSIS — F5105 Insomnia due to other mental disorder: Secondary | ICD-10-CM | POA: Diagnosis not present

## 2015-03-24 MED ORDER — ALPRAZOLAM 0.5 MG PO TABS: 0.5000 mg | ORAL_TABLET | Freq: Every evening | ORAL | Status: DC | PRN

## 2015-03-24 NOTE — Progress Notes (Signed)
SLEEP MEDICINE CLINIC   Provider:  Larey Seat, M D  Referring Provider: Dr Krista Blue , internal referral  Primary Care Physician:  Leonard Downing, MD  Chief Complaint  Patient presents with  . New Patient (Initial Visit)    snores at night, not getting enough sleep, rm 11, with wife    HPI:  Vincent [name] is a 50 y.o. male , seen here as a referral from [name] and Dr.  Arelia Sneddon for a sleep consultation,  Chief complaint according to patient :  "Benign wake up in the morning I feel I have hardly slept at all"  There have no reports that his sleep is often interrupted. He describes a spontaneous arousal and because he is already awake he may go to the bathroom but it is not nocturia that wakes him. He is usually not woken by headaches. If his blood sugars are extremely high or low he may wake up with a diaphoresis or palpitations. He has had frequent periods of confusion before [name] treated him with lamotrigine, and now having reached 200 mg twice a day the spells have become much shorter and less frequent. His wife describes that he may sleep up to 2 AM Brother deeply and then seems to become very restless for the rest of the night. Throughout the morning he is tossing and turning, her own sleep is impaired by this. He has been having more insomnia problems for the last 6 month.   Sleep habits are as follows:  The couple usually retreats to the bedroom around 11 PM, he reports that his mind is racing delaying his sleep onset. He sleeps on 2 pillows, the bedroom is described as cool, quiet and dark. If he is unable to initiate sleep by 2 AM he leaves the bed and goes to another room. The patient describes that he usually can sleep straight for about 2 hours and then becomes more and more restless and to sleep more and more fragmented. He feels awake but not refreshed or restored. He may stay in bed even that he does not sleep. He rises in the morning around 9 AM if his sleep was  very poor he may stay in bed until 10 or 11 AM. He will have one cup of coffee in the morning, during the day he will drink between 2 and 4 cans of [employer]. He frequently naps. He is tired and moody . Sleeps in daytime in from of the TV on a recliner.  His wife reports that she has her to snore but not to have stopped breathing or gasping for air. He denies waking up with headaches, and has a very dry mouth in the morning.   Sleep medical history and family sleep history:  insomnia for about 6 month. On chronic narcotics, fentanyl patches.  Mr. Loney Loh is a higher risk of central sleep apnea due to the chronic pain medication he needs to take, he has high risk factors for obstructive sleep apnea as well, being edentulous. The patient's insomnia may also be fostered by Lamictal.  HISTORICAL  Vincent [name] is a 50 years old left-handed male, accompanied by his wife [name], seen in refer by his primary care physician [name] for evaluation of transient confusion episodes  Chart reviewed, he had past medical history of hypertension, hyperlipidemia, diabetes, insulin-dependent, history of bladder cancer, bipolar disorder, is taking Lithium, chronic pain, on fentanyl patch, longtime smoker, 2 packs per day  Since November 2015,  he had multiple recurrent episode of confusion, sometimes he forgets that he has to take out his trash can, repeating his questions, was also noted by family to have staring spells, confusion can last up to 8 hours, there was no loss of consciousness  Some of the episode has acute onset, in March 05 2014, he went outside of the yard after smoking a cigarette, he suddenly became confused, repeating his questions, agitated, combative, "as if he is on drugs " , the confusion last about 24 hours, he was taken by ambulance to the emergency room, UDS was negative,  Lab reviewed, appetite is panel was negative, HIV was negative, ammonia was mildly elevated, it was  considered due to polypharmacy fentanyl patch, Dilaudid, Ultram, Neurontin  He reported history of head trauma in 2008, his right frontal region was hit by a rotatory plate, transient loss of consciousness, with skin abrasion, had multiple stitches.   have personally reviewed MRI of the brain with and without contrast December 2015 that was normal, EEG was normal   UPDATE Oct 5th 2016: EEG was normal, he tolerated lamotrigine very well, while he was taking 100 mg twice a day, he has much less current episode, only had 3 episode since September 2016, short lasting, mild confusion,   I also watched the video tape March 05 2014, patient was agitated, confused, but no dysarthria, no lateralized motor deficit noticed during the video tape, he was searching through the knife Holder for his keys, could not find towels at his home    Review of Systems: Out of a complete 14 system review, the patient complains of only the following symptoms, and all other reviewed systems are negative. Snoring, thunderously . Insomnia, fentanyl user. Caffeine user.   Fell asleep on the highway having seizures and sleep attacks!!  How likely are you to doze in the following situations: 0 = not likely, 1 = slight chance, 2 = moderate chance, 3 = high chance  Sitting and Reading? 2 Watching Television?3 Sitting inactive in a public place (theater or meeting)?3 Lying down in the afternoon when circumstances permit?2 Sitting and talking to someone?3 Sitting quietly after lunch without alcohol?1 In a car, while stopped for a few minutes in traffic?1 As a passenger in a car for an hour without a break?2  Total = 16    Epworth score 16 , Fatigue severity score 55  , depression score  Positive - bipolar , PHQ 9    Social History   Social History  . Marital Status: Married    Spouse Name: N/A  . Number of Children: 3  . Years of Education: GED   Occupational History  . Librarian, academic of company   Social History Main Topics  . Smoking status: Current Every Day Smoker -- 2.00 packs/day for 20 years    Types: Cigarettes  . Smokeless tobacco: Never Used  . Alcohol Use: Yes     Comment: RARE  . Drug Use: No  . Sexual Activity: Not on file   Other Topics Concern  . Not on file   Social History Narrative   Lives at home with his wife and children.   Left-handed.   3-4 cups caffeine per day.    Family History  Problem Relation Age of Onset  . Diabetes Mother   . Diabetes Father   . Hypertension Father   . Heart attack Father   . Heart attack Mother  Past Medical History  Diagnosis Date  . COPD (chronic obstructive pulmonary disease) ([place])   . GERD (gastroesophageal reflux disease)   . Neuropathy in diabetes ([place])     LOWER EXTREMITIES  . Hypothyroidism   . History of TIA (transient ischemic attack) 2008--- NO RESIDUAL  . Diabetes mellitus, type 2 ([place])   . H/O hiatal hernia   . History of kidney stones   . Urgency of urination   . Gastroparesis   . Hyperlipidemia   . History of gastric ulcer   . Chronic pain syndrome   . Bladder cancer ([place])   . Urinary hesitancy   . At risk for sleep apnea     STOP-BANG=  5       SENT TO PCP 02-24-2014  . Bipolar 1 disorder ([place])   . Arthritis   . Memory loss   . High cholesterol   . Anxiety   . Seizures ([place])     ? sz,"spaces out"  . Carpal tunnel syndrome   . Stroke [facility])     TIA    Past Surgical History  Procedure Laterality Date  . Rotator cuff repair Right 12/2004  . Carpal tunnel release Right 09-16-2003  . Laparoscopic cholecystectomy  11-17-2010  . Cardiac catheterization  12-27-2001  DR Einar Gip  &  05-26-2009  DR VARANASI    RESULTS FOR BOTH ARE NORMAL CORONARIES AND PERSERVED LVF/ EF 60%  . Transurethral resection of bladder tumor N/A 08/09/2012    Procedure: TRANSURETHRAL RESECTION OF BLADDER TUMOR (TURBT) WITH GYRUS WITH MITOMYCIN C;  Surgeon: Claybon Jabs, MD;  Location: [facility];  Service: Urology;  Laterality: N/A;  . Cystoscopy N/A 10/10/2012    Procedure: CYSTOSCOPY CLOT EVACUATION FULGERATION OF BLEEDERS ;  Surgeon: Claybon Jabs, MD;  Location: [place];  Service: Urology;  Laterality: N/A;  . Transurethral resection of bladder tumor with gyrus (turbt-gyrus) N/A 02/27/2014    Procedure: TRANSURETHRAL RESECTION OF BLADDER TUMOR WITH GYRUS (TURBT-GYRUS);  Surgeon: Claybon Jabs, MD;  Location: [facility];  Service: Urology;  Laterality: N/A;  . Cholecystectomy    . Carpal tunnel release Left 02/25/2015    Procedure: LEFT CARPAL TUNNEL RELEASE;  Surgeon: Leanora Cover, MD;  Location: [place];  Service: Orthopedics;  Laterality: Left;    Current Outpatient Prescriptions  Medication Sig Dispense Refill  . atorvastatin (LIPITOR) 10 MG tablet Take 10 mg by mouth daily.    . fenofibrate 160 MG tablet Take 160 mg by mouth daily.    . fentaNYL (DURAGESIC - DOSED MCG/HR) 100 MCG/HR Place 100 mcg onto the skin every other day.     . fish oil-omega-3 fatty acids 1000 MG capsule Take 1,000-2,000 mg by mouth 2 (two) times daily. Take 1 capsule in the morning and 2 capsules in the evening    . folic acid (FOLVITE) 1 MG tablet Take 2 mg by mouth daily.    Marland Kitchen gabapentin (NEURONTIN) 600 MG tablet Take 600 mg by mouth 4 (four) times daily.    . Insulin Lispro Prot & Lispro (HUMALOG MIX 75/25 KWIKPEN) (75-25) 100 UNIT/ML Kwikpen Inject 160 units at breakfast and 60 units at supper. 20 pen 3  . lamoTRIgine (LAMICTAL) 100 MG tablet Take 2 tablets (200 mg total) by mouth 2 (two) times daily. 120 tablet 11  . levothyroxine (SYNTHROID, LEVOTHROID) 25 MCG tablet Take 25 mcg by mouth daily before breakfast.     . lithium carbonate (LITHOBID)  300 MG CR tablet Take 450 mg by mouth 2 (two) times daily.     . methotrexate (RHEUMATREX) 2.5 MG tablet Take 2.5 mg by mouth once a week. Taking 8 tablets weekly.  Caution:Chemotherapy. Protect from light.    Marland Kitchen omeprazole (PRILOSEC) 20 MG capsule Take 20 mg by mouth daily.    Marland Kitchen oxyCODONE-acetaminophen (PERCOCET) 5-325 MG tablet 1-2 tabs po q6 hours prn pain 30 tablet 0  . pregabalin (LYRICA) 100 MG capsule Take 100 mg by mouth.     No current facility-administered medications for this visit.    Allergies as of 03/24/2015 - Review Complete 03/24/2015  Allergen Reaction Noted  . Celebrex [celecoxib] Anaphylaxis and Rash 10/13/2010  . Hydrocodone Rash and Other (See Comments) 02/23/2014  . Sulfa antibiotics Rash 10/13/2010    Vitals: BP 150/80 mmHg  Pulse 64  Resp 20  Ht 6' (1.829 m)  Wt 241 lb (109.317 kg)  BMI 32.68 kg/m2 Last Weight:  Wt Readings from Last 1 Encounters:  03/24/15 241 lb (109.317 kg)   [id number] mass index is 32.68 kg/(m^2).     Last Height:   Ht Readings from Last 1 Encounters:  03/24/15 6' (1.829 m)    Physical exam:  General: The patient is awake, alert and appears not in acute distress. The patient is well groomed. Head: Normocephalic, atraumatic. Neck is supple. Mallampati 2-3  neck circumference:18. Nasal airflow open , TMJ is  Not  evident . Retrognathia is not seen.  Cardiovascular:  Regular rate and rhythm  without  murmurs or carotid bruit, and without distended neck veins. Respiratory: Lungs are clear to auscultation. Skin:  Without evidence of edema, or rash Trunk: BMI is elevated . The patient's posture is erect  Neurologic exam : The patient is awake and alert, oriented to place and time.   Memory subjective  described as intact.    MOCA:No flowsheet data found. MMSE: MMSE - Mini Mental State Exam 11/04/2014  Orientation to time 3  Orientation to Place 5  Registration 3  Attention/ Calculation 5  Recall 1  Language- name 2 objects 2  Language- repeat 1  Language- follow 3 step command 3  Language- read & follow direction 1  Write a sentence 1  Copy design 1  Total score 26       Attention span & concentration ability appears normal.  Speech is fluent,  without dysarthria, dysphonia or aphasia.  Mood and affect are appropriate.  Cranial nerves: there is a left temples scar , from a scalp penetrating farming accident, likely concussion, with LOC.  Pupils are equal and briskly reactive to light. Funduscopic exam without  evidence of pallor or edema. History of diabetic involvement.  Extraocular movements  in vertical and horizontal planes intact and without nystagmus. Visual fields by finger perimetry are intact. Hearing to finger rub intact.   Facial sensation intact to fine touch.  Facial motor strength is symmetric and tongue and uvula move midline. Shoulder shrug was symmetrical.   Motor exam:  Normal tone, muscle bulk and symmetric strength in all extremities.  Sensory:  Fine touch, pinprick and vibration were tested in all extremities.  Proprioception tested in the upper extremities was normal.  Coordination: Rapid alternating movements in the fingers/hands was normal. Finger-to-nose maneuver  normal without evidence of ataxia, dysmetria or tremor.  Gait and station: Patient walks without assistive device and is able unassisted to climb up to the exam table. Strength within normal limits.  Stance is stable and normal.  Deep tendon reflexes: in the  upper and lower extremities are symmetric and intact. Babinski maneuver response is downgoing.  The patient was advised of the nature of the diagnosed sleep disorder , the treatment options and risks for general a health and wellness arising from not treating the condition.  I spent more than 45 minutes of face to face time with the patient. Greater than 50% of time was spent in counseling and coordination of care. We have discussed the diagnosis and differential and I answered the patient's questions.     Assessment:  After physical and neurologic examination, review of laboratory studies,  Personal review of  imaging studies, reports of other /same  Imaging studies ,  Results of polysomnography/ neurophysiology testing and pre-existing records as far as provided in visit., my assessment is   1) Mr. Heffner has an elevated risk of being a patient with obstructive sleep apnea partially due to his edentulous dental status, partially due to an elevated body mass index and a larger neck circumference. He used to be physically much more active while he was gainfully employed. His lifestyle and his restriction on driving etc. has affected this.   Insomnia -Treatment of his seizures was Lamictal has definitely decreased the frequency and intensity of the spells, but has impacted his ability to initiate sleep. He still reports racing thoughts which are partially attributed to bipolar disorder. Lamictal can endorsed insomnia in some patients.   A third concern is the possible presence of central sleep apnea due to the fentanyl and when necessary narcotic medications taken for chronic pain syndrome. He has RA .   Plan:  Treatment plan and additional workup :  I am planning to schedule this patient for an attended sleep study. I will prescribe a sleep medication to help him enter sleep. I would use a low-dose of Xanax to facilitate sleep initiation to that we have a chance to establish an apnea count and sees the quality and quantity of apnea. I would like for him to be evaluated with a CO2 retention measurement device. The sleep study will likely be performed in late January 2017. RV after the study.    Asencion Partridge Jamiere Gulas MD  03/24/2015   CC: Leonard Downing, Md [address] [place], [place] [address]

## 2015-03-24 NOTE — Patient Instructions (Signed)
Insomnia Insomnia is a sleep disorder that makes it difficult to fall asleep or to stay asleep. Insomnia can cause tiredness (fatigue), low energy, difficulty concentrating, mood swings, and poor performance at work or school.  There are three different ways to classify insomnia:  Difficulty falling asleep.  Difficulty staying asleep.  Waking up too early in the morning. Any type of insomnia can be long-term (chronic) or short-term (acute). Both are common. Short-term insomnia usually lasts for three months or less. Chronic insomnia occurs at least three times a week for longer than three months. CAUSES  Insomnia may be caused by another condition, situation, or substance, such as:  Anxiety.  Certain medicines.  Gastroesophageal reflux disease (GERD) or other gastrointestinal conditions.  Asthma or other breathing conditions.  Restless legs syndrome, sleep apnea, or other sleep disorders.  Chronic pain.  Menopause. This may include hot flashes.  Stroke.  Abuse of alcohol, tobacco, or illegal drugs.  Depression.  Caffeine.   Neurological disorders, such as Alzheimer disease.  An overactive thyroid (hyperthyroidism). The cause of insomnia may not be known. RISK FACTORS Risk factors for insomnia include:  Gender. Women are more commonly affected than men.  Age. Insomnia is more common as you get older.  Stress. This may involve your professional or personal life.  Income. Insomnia is more common in people with lower income.  Lack of exercise.   Irregular work schedule or night shifts.  Traveling between different time zones. SIGNS AND SYMPTOMS If you have insomnia, trouble falling asleep or trouble staying asleep is the main symptom. This may lead to other symptoms, such as:  Feeling fatigued.  Feeling nervous about going to sleep.  Not feeling rested in the morning.  Having trouble concentrating.  Feeling irritable, anxious, or depressed. TREATMENT   Treatment for insomnia depends on the cause. If your insomnia is caused by an underlying condition, treatment will focus on addressing the condition. Treatment may also include:   Medicines to help you sleep.  Counseling or therapy.  Lifestyle adjustments. HOME CARE INSTRUCTIONS   Take medicines only as directed by your health care provider.  Keep regular sleeping and waking hours. Avoid naps.  Keep a sleep diary to help you and your health care provider figure out what could be causing your insomnia. Include:   When you sleep.  When you wake up during the night.  How well you sleep.   How rested you feel the next day.  Any side effects of medicines you are taking.  What you eat and drink.   Make your bedroom a comfortable place where it is easy to fall asleep:  Put up shades or special blackout curtains to block light from outside.  Use a white noise machine to block noise.  Keep the temperature cool.   Exercise regularly as directed by your health care provider. Avoid exercising right before bedtime.  Use relaxation techniques to manage stress. Ask your health care provider to suggest some techniques that may work well for you. These may include:  Breathing exercises.  Routines to release muscle tension.  Visualizing peaceful scenes.  Cut back on alcohol, caffeinated beverages, and cigarettes, especially close to bedtime. These can disrupt your sleep.  Do not overeat or eat spicy foods right before bedtime. This can lead to digestive discomfort that can make it hard for you to sleep.  Limit screen use before bedtime. This includes:  Watching TV.  Using your smartphone, tablet, and computer.  Stick to a routine. This   can help you fall asleep faster. Try to do a quiet activity, brush your teeth, and go to bed at the same time each night.  Get out of bed if you are still awake after 15 minutes of trying to sleep. Keep the lights down, but try reading or  doing a quiet activity. When you feel sleepy, go back to bed.  Make sure that you drive carefully. Avoid driving if you feel very sleepy.  Keep all follow-up appointments as directed by your health care provider. This is important. SEEK MEDICAL CARE IF:   You are tired throughout the day or have trouble in your daily routine due to sleepiness.  You continue to have sleep problems or your sleep problems get worse. SEEK IMMEDIATE MEDICAL CARE IF:   You have serious thoughts about hurting yourself or someone else.   This information is not intended to replace advice given to you by your health care provider. Make sure you discuss any questions you have with your health care provider.   Document Released: 02/25/2000 Document Revised: 11/18/2014 Document Reviewed: 11/28/2013 [employer] Interactive Patient Education 2016 [employer]  

## 2015-04-01 ENCOUNTER — Encounter: Payer: Self-pay | Admitting: Nurse Practitioner

## 2015-04-01 ENCOUNTER — Ambulatory Visit (INDEPENDENT_AMBULATORY_CARE_PROVIDER_SITE_OTHER): Payer: 59 | Admitting: Nurse Practitioner

## 2015-04-01 VITALS — BP 132/78 | HR 72 | Ht 72.0 in | Wt 250.8 lb

## 2015-04-01 DIAGNOSIS — E1143 Type 2 diabetes mellitus with diabetic autonomic (poly)neuropathy: Secondary | ICD-10-CM | POA: Diagnosis not present

## 2015-04-01 DIAGNOSIS — G4733 Obstructive sleep apnea (adult) (pediatric): Secondary | ICD-10-CM

## 2015-04-01 DIAGNOSIS — Z794 Long term (current) use of insulin: Secondary | ICD-10-CM | POA: Diagnosis not present

## 2015-04-01 DIAGNOSIS — Z5181 Encounter for therapeutic drug level monitoring: Secondary | ICD-10-CM | POA: Diagnosis not present

## 2015-04-01 DIAGNOSIS — F329 Major depressive disorder, single episode, unspecified: Secondary | ICD-10-CM

## 2015-04-01 DIAGNOSIS — R404 Transient alteration of awareness: Secondary | ICD-10-CM | POA: Diagnosis not present

## 2015-04-01 DIAGNOSIS — F32A Depression, unspecified: Secondary | ICD-10-CM

## 2015-04-01 NOTE — Progress Notes (Signed)
[facility]  PATIENT: Vincent [name] DOB: 04/10/1965   REASON FOR VISIT: Follow-up for episodes of transient confusional episodes, jerking of extremities Vincent of bipolar disorder Vincent FROM: Patient and wife    Vincent OF PRESENT ILLNESS: Vincent [name] is a 50 years old left-handed male, accompanied by his wife Vincent [name], seen in refer by his primary care physician [name] for evaluation of transient confusion episodes Chart reviewed, he had past medical Vincent of hypertension, hyperlipidemia, diabetes, insulin-dependent, Vincent of bladder cancer, bipolar disorder, is taking Lithium, chronic pain, on fentanyl patch, longtime smoker, 2 packs per day Since November 2015, he had multiple recurrent episode of confusion, sometimes he forgets that he has to take out his trash can, repeating his questions, was also noted by family to have staring spells, confusion can last up to 8 hours, there was no loss of consciousness Some of the episode has acute onset, in March 05 2014, he went outside of the yard after smoking a cigarette, he suddenly became confused, repeating his questions, agitated, combative, "as if he is on drugs " , the confusion last about 24 hours, he was taken by ambulance to the emergency room, UDS was negative, Lab reviewed, appetite is panel was negative, HIV was negative, ammonia was mildly elevated, it was considered due to polypharmacy fentanyl patch, Dilaudid, Ultram, Neurontin He reported Vincent of head trauma in 2008, his right frontal region was hit by a rotatory plate, transient loss of consciousness, with skin abrasion, had multiple stitches. have personally reviewed MRI of the brain with and without contrast December 2015 that was normal, EEG was normal   UPDATE Oct 5th 2016:[name] was normal, he tolerated lamotrigine very well, while he was taking 100 mg twice a day, he has much less current episode, only had 3 episode  since September 2016, short lasting, mild confusion,  I also watched the video tape March 05 2014, patient was agitated, confused, but no dysarthria, no lateralized motor deficit noticed during the video tape, he was searching through the knife Holder for his keys, could not find towels at his home  His wife reported recurrent events, August 24, he has prolonged tremor of his left arm, complains everything look orange, very sleepy afterwards November 06 2014, black stairs, lasting for 10 minutes, disoriented for the rest of the night, fell to sleep during dinnertime August 27, he complains of feeling strange, took a long nap, irritable afterwords August 30 first, disoriented confused, not sure where he was, fell to sleep, broke out in cold sweat afterwards September 2, he slept outside in the chair, woke up slumped to the floor, face looked droopy, September 6, he complains of confused, feel funny, September 10, complains of confused not feeling well,  He had no recurrent episode ever since. His mood has improved with lamotrigine as well  UPDATE Mar 18 2015:YY He had few episodes since last visit, confused, mild slurred speech, slow talking, everything goes slow motion while taking The more he use his hands, the more shaky he got. He is now taking lamotrigine 100 mg in the morning, 200 mg at nighttime He also complains of difficulty sleeping, snoring, excessive daytime sleepiness, fatigue, ESS score is 15, FSS score is 57  UPDATE 04/01/2015 Vincent [name], 50 year old male returns for follow-up. He has been previously seen in this office by [name]. Apparently he had seizure-like activity on Saturday and then 2 seizures last night one lasting greater than 10 minutes. He  did not go to the emergency room. Apparently last night he walked outside to smoke cigarettes then his wife heard him pounding on the door, patient was able to talk to her however his head was shaking and his  entire body was shaking. I  watched a video and saw  no lateralized motor deficit. His eyes were closed. After the event he feels off balance with facial drooping. He is currently on Lamictal 200 twice daily. He was recently evaluated for OSA and is due to have a sleep study. He also needs repeat EEG. He returns for reevaluation. Previous records reviewed  REVIEW OF SYSTEMS: Full 14 system review of systems performed and notable only for those listed, all others are neg:  Constitutional: neg  Cardiovascular: neg Ear/Nose/Throat: neg  Skin: neg Eyes: neg Respiratory: neg Gastroitestinal: neg  Hematology/Lymphatic: neg  Endocrine: neg Musculoskeletal:neg Allergy/Immunology: neg Neurological: Seizure Psychiatric: neg Sleep : neg   ALLERGIES: Allergies  Allergen Reactions  . Celebrex [Celecoxib] Anaphylaxis and Rash  . Hydrocodone Rash and Other (See Comments)    "blisters developed on arms"  . Sulfa Antibiotics Rash    HOME MEDICATIONS: Outpatient Prescriptions Prior to Visit  Medication Sig Dispense Refill  . ALPRAZolam (XANAX) 0.5 MG tablet Take 1 tablet (0.5 mg total) by mouth at bedtime as needed for anxiety. 30 tablet 1  . atorvastatin (LIPITOR) 10 MG tablet Take 10 mg by mouth daily.    . fentaNYL (DURAGESIC - DOSED MCG/HR) 100 MCG/HR Place 100 mcg onto the skin every other day.     . fish oil-omega-3 fatty acids 1000 MG capsule Take 1,000-2,000 mg by mouth 2 (two) times daily. Take 1 capsule in the morning and 2 capsules in the evening    . folic acid (FOLVITE) 1 MG tablet Take 2 mg by mouth daily.    Marland Kitchen gabapentin (NEURONTIN) 600 MG tablet Take 600 mg by mouth 4 (four) times daily.    . Insulin Lispro Prot & Lispro (HUMALOG MIX 75/25 KWIKPEN) (75-25) 100 UNIT/ML Kwikpen Inject 160 units at breakfast and 60 units at supper. 20 pen 3  . lamoTRIgine (LAMICTAL) 100 MG tablet Take 2 tablets (200 mg total) by mouth 2 (two) times daily. 120 tablet 11  . levothyroxine (SYNTHROID, LEVOTHROID) 25 MCG tablet  Take 25 mcg by mouth daily before breakfast.     . lithium carbonate (LITHOBID) 300 MG CR tablet Take 450 mg by mouth 2 (two) times daily.     . methotrexate (RHEUMATREX) 2.5 MG tablet Take 2.5 mg by mouth once a week. Taking 8 tablets weekly. Caution:Chemotherapy. Protect from light.    Marland Kitchen omeprazole (PRILOSEC) 20 MG capsule Take 20 mg by mouth daily.    Marland Kitchen oxyCODONE-acetaminophen (PERCOCET) 5-325 MG tablet 1-2 tabs po q6 hours prn pain 30 tablet 0  . pregabalin (LYRICA) 100 MG capsule Take 100 mg by mouth. Taking 1 tablet in am and 2 tablets in pm    . fenofibrate 160 MG tablet Take 160 mg by mouth daily.     No facility-administered medications prior to visit.    PAST MEDICAL Vincent: Past Medical Vincent  Diagnosis Date  . COPD (chronic obstructive pulmonary disease) ([place])   . GERD (gastroesophageal reflux disease)   . Neuropathy in diabetes ([place])     LOWER EXTREMITIES  . Hypothyroidism   . Vincent of TIA (transient ischemic attack) 2008--- NO RESIDUAL  . Diabetes mellitus, type 2 ([place])   . H/O hiatal hernia   . Vincent of  kidney stones   . Urgency of urination   . Gastroparesis   . Hyperlipidemia   . Vincent of gastric ulcer   . Chronic pain syndrome   . Bladder cancer ([place])   . Urinary hesitancy   . At risk for sleep apnea     STOP-BANG=  5       SENT TO PCP 02-24-2014  . Bipolar 1 disorder ([place])   . Arthritis   . Memory loss   . High cholesterol   . Anxiety   . Seizures ([place])     ? sz,"spaces out"  . Carpal tunnel syndrome   . Stroke [facility])     TIA    PAST SURGICAL Vincent: Past Surgical Vincent  Procedure Laterality Date  . Rotator cuff repair Right 12/2004  . Carpal tunnel release Right 09-16-2003  . Laparoscopic cholecystectomy  11-17-2010  . Cardiac catheterization  12-27-2001  DR Einar Gip  &  05-26-2009  DR VARANASI    RESULTS FOR BOTH ARE NORMAL CORONARIES AND PERSERVED LVF/ EF 60%  . Transurethral resection of bladder tumor N/A 08/09/2012    Procedure:  TRANSURETHRAL RESECTION OF BLADDER TUMOR (TURBT) WITH GYRUS WITH MITOMYCIN C;  Surgeon: Claybon Jabs, MD;  Location: [facility];  Service: Urology;  Laterality: N/A;  . Cystoscopy N/A 10/10/2012    Procedure: CYSTOSCOPY CLOT EVACUATION FULGERATION OF BLEEDERS ;  Surgeon: Claybon Jabs, MD;  Location: [place];  Service: Urology;  Laterality: N/A;  . Transurethral resection of bladder tumor with gyrus (turbt-gyrus) N/A 02/27/2014    Procedure: TRANSURETHRAL RESECTION OF BLADDER TUMOR WITH GYRUS (TURBT-GYRUS);  Surgeon: Claybon Jabs, MD;  Location: [facility];  Service: Urology;  Laterality: N/A;  . Cholecystectomy    . Carpal tunnel release Left 02/25/2015    Procedure: LEFT CARPAL TUNNEL RELEASE;  Surgeon: Leanora Cover, MD;  Location: [place];  Service: Orthopedics;  Laterality: Left;    FAMILY Vincent: Family Vincent  Problem Relation Age of Onset  . Diabetes Mother   . Diabetes Father   . Hypertension Father   . Heart attack Father   . Heart attack Mother     SOCIAL Vincent: Social Vincent   Social Vincent  . Marital Status: Married    Spouse Name: N/A  . Number of Children: 3  . Years of Education: GED   Occupational Vincent  . Warden/ranger of company   Social Vincent Main Topics  . Smoking status: Current Every Day Smoker -- 2.00 packs/day for 20 years    Types: Cigarettes  . Smokeless tobacco: Never Used  . Alcohol Use: Yes     Comment: RARE  . Drug Use: No  . Sexual Activity: Not on file   Other Topics Concern  . Not on file   Social Vincent Narrative   Lives at home with his wife and children.   Left-handed.   3-4 cups caffeine per day.     PHYSICAL EXAM  Filed Vitals:   04/01/15 0857  BP: 132/78  Pulse: 72  Height: 6' (1.829 m)  Weight: 250 lb 12.8 oz (113.762 kg)   Body mass index is 34.01 kg/(m^2). Gen: NAD, conversant, well nourised, obese, well  groomed  Cardiovascular: Regular rate rhythm,  Neck: Supple, no carotid bruit. Pulmonary: Clear to auscultation bilaterally   NEUROLOGICAL EXAM:  MENTAL STATUS: Speech:  Speech is normal; fluent and spontaneous with normal comprehension.   Cognition:  Tired looking middle-age male  He is oriented to date, month and place  Normal Attention span and concentration  Normal Language, naming, repeating,spontaneous speech  Fund of knowledge  CRANIAL NERVES: CN II: Visual fields are full to confrontation. Fundoscopic exam is normal with sharp discs and no vascular changes. Pupils are round equal and briskly reactive to light. CN III, IV, VI: extraocular movement are normal. No ptosis. CN V: Facial sensation is intact to pinprick in all 3 divisions bilaterally.  CN VII: Face is symmetric with normal eye closure and smile. CN VIII: Hearing is normal to rubbing fingers CN IX, X: Palate elevates symmetrically. Phonation is normal. CN XI: Head turning and shoulder shrug are intact CN XII: Tongue is midline with normal movements and no atrophy. Narrow oropharyngeal MOTOR:There is no pronator drift of out-stretched arms. Muscle bulk and tone are normal. Muscle strength is normal. REFLEXES:Reflexes are 2+ and symmetric at the biceps, triceps, knees, and ankles. Plantar responses are flexor. SENSORY:Intact to light touch, pinprick, position sense, and vibration sense are intact in fingers and toes. COORDINATION:Rapid alternating movements and fine finger movements are intact. There is no dysmetria on finger-to-nose and heel-knee-shin.  GAIT/STANCE: Ambulated 25 feet in hall, no difficulty with turns, can heel and toe walk, tandem steady Romberg is negative DIAGNOSTIC DATA (LABS, IMAGING, TESTING) -  Lab Results  Component Value Date   HGBA1C 8.0 03/02/2015       ASSESSMENT AND PLAN  51 y.o. year old male  has a past medical Vincent of COPD (chronic  obstructive pulmonary disease) ([place]); bipolar disorder, chronic pain syndrome previous head trauma presenting with recurrent episodes of confusion since November 2015. Differential diagnosis complex partial versus mood disorder versus pseudoseizure Obstructive sleep apnea.  Will get labs today, CBC, CMP and Lamictal level Patient to obtain EEG on 04/19/15  Obtain sleep study Observe seizure precautions Please remember, common seizure triggers are: Sleep deprivation, dehydration, overheating, stress, hypoglycemia or skipping meals, certain medications or excessive alcohol use, especially stopping alcohol abruptly if you have had heavy alcohol use before (aka alcohol withdrawal seizure). If you have a prolonged seizure over 2-5 minutes or back to back seizures, call or have someone call 911 or take you to the nearest emergency room. You cannot drive a car or operate any other machinery or vehicle within 6 months of a seizure. Please do not swim alone or take a tub bath for safety. Do not cook with large quantities of boiling water or oil for safety. Take your medicine for seizure prevention regularly and do not skip doses or stop medication abruptly and tone are told to do so by your healthcare provider. Try to get a refill on your antiepileptic medication ahead of time, so you are not at risk of running out. If you run out of the seizure medication and do not have a refill at hand she may run into medication withdrawal seizures. Avoid taking Wellbutrin, narcotic pain medications and tramadol, as they can lower seizure threshold.  F/U as scheduled Vst time 30 min Dennie Bible, [facility], [facility], APRN  [facility] [address] [place], [place] [address] [phone]

## 2015-04-01 NOTE — Patient Instructions (Signed)
Will get labs today Patient to obtain EEG on 04/19/15  Obtain sleep study Observe seizure precautions Please remember, common seizure triggers are: Sleep deprivation, dehydration, overheating, stress, hypoglycemia or skipping meals, certain medications or excessive alcohol use, especially stopping alcohol abruptly if you have had heavy alcohol use before (aka alcohol withdrawal seizure). If you have a prolonged seizure over 2-5 minutes or back to back seizures, call or have someone call 911 or take you to the nearest emergency room. You cannot drive a car or operate any other machinery or vehicle within 6 months of a seizure. Please do not swim alone or take a tub bath for safety. Do not cook with large quantities of boiling water or oil for safety. Take your medicine for seizure prevention regularly and do not skip doses or stop medication abruptly and tone are told to do so by your healthcare provider. Try to get a refill on your antiepileptic medication ahead of time, so you are not at risk of running out. If you run out of the seizure medication and do not have a refill at hand she may run into medication withdrawal seizures. Avoid taking Wellbutrin, narcotic pain medications and tramadol, as they can lower seizure threshold.  F/U as scheduled

## 2015-04-01 NOTE — Progress Notes (Signed)
I have reviewed and agreed above plan. 

## 2015-04-02 LAB — CBC WITH DIFFERENTIAL/PLATELET
Basophils Absolute: 0.1 10*3/uL (ref 0.0–0.2)
Basos: 1 %
EOS (ABSOLUTE): 0.2 10*3/uL (ref 0.0–0.4)
Eos: 3 %
Hematocrit: 43.5 % (ref 37.5–51.0)
Hemoglobin: 14.2 g/dL (ref 12.6–17.7)
Immature Grans (Abs): 0 10*3/uL (ref 0.0–0.1)
Immature Granulocytes: 1 %
Lymphocytes Absolute: 2.3 10*3/uL (ref 0.7–3.1)
Lymphs: 32 %
MCH: 29.6 pg (ref 26.6–33.0)
MCHC: 32.6 g/dL (ref 31.5–35.7)
MCV: 91 fL (ref 79–97)
Monocytes Absolute: 0.6 10*3/uL (ref 0.1–0.9)
Monocytes: 8 %
Neutrophils Absolute: 4 10*3/uL (ref 1.4–7.0)
Neutrophils: 55 %
Platelets: 236 10*3/uL (ref 150–379)
RBC: 4.79 x10E6/uL (ref 4.14–5.80)
RDW: 13.7 % (ref 12.3–15.4)
WBC: 7.1 10*3/uL (ref 3.4–10.8)

## 2015-04-02 LAB — COMPREHENSIVE METABOLIC PANEL
ALT: 30 IU/L (ref 0–44)
AST: 23 IU/L (ref 0–40)
Albumin/Globulin Ratio: 2.3 (ref 1.1–2.5)
Albumin: 4.2 g/dL (ref 3.5–5.5)
Alkaline Phosphatase: 58 IU/L (ref 39–117)
BUN/Creatinine Ratio: 14 (ref 9–20)
BUN: 14 mg/dL (ref 6–24)
Bilirubin Total: 0.3 mg/dL (ref 0.0–1.2)
CO2: 25 mmol/L (ref 18–29)
Calcium: 9.3 mg/dL (ref 8.7–10.2)
Chloride: 97 mmol/L (ref 96–106)
Creatinine, Ser: 1 mg/dL (ref 0.76–1.27)
GFR calc Af Amer: 103 mL/min/{1.73_m2} (ref >59)
GFR calc non Af Amer: 89 mL/min/{1.73_m2} (ref >59)
Globulin, Total: 1.8 g/dL (ref 1.5–4.5)
Glucose: 286 mg/dL — ABNORMAL HIGH (ref 65–99)
Potassium: 4.3 mmol/L (ref 3.5–5.2)
Sodium: 137 mmol/L (ref 134–144)
Total Protein: 6 g/dL (ref 6.0–8.5)

## 2015-04-02 LAB — LAMOTRIGINE LEVEL: Lamotrigine Lvl: 3.4 ug/mL (ref 2.0–20.0)

## 2015-04-05 ENCOUNTER — Telehealth: Payer: Self-pay | Admitting: *Deleted

## 2015-04-05 NOTE — Telephone Encounter (Signed)
LVM informing patient his labs were reviewed with Dr Krista Blue by Daun Peacock, NP. His glucose level was high, Lamictal is at therapeutic level. Informed him that C Hassell Done will not make any changes to medication; he should continue at current dose. Left this caller's name, number for questions.

## 2015-04-19 ENCOUNTER — Ambulatory Visit (INDEPENDENT_AMBULATORY_CARE_PROVIDER_SITE_OTHER): Payer: 59 | Admitting: Neurology

## 2015-04-19 DIAGNOSIS — R0683 Snoring: Secondary | ICD-10-CM

## 2015-04-19 DIAGNOSIS — R410 Disorientation, unspecified: Secondary | ICD-10-CM | POA: Diagnosis not present

## 2015-04-19 DIAGNOSIS — F32A Depression, unspecified: Secondary | ICD-10-CM

## 2015-04-19 DIAGNOSIS — F329 Major depressive disorder, single episode, unspecified: Secondary | ICD-10-CM

## 2015-04-19 DIAGNOSIS — Z794 Long term (current) use of insulin: Secondary | ICD-10-CM

## 2015-04-19 DIAGNOSIS — G40401 Other generalized epilepsy and epileptic syndromes, not intractable, with status epilepticus: Secondary | ICD-10-CM

## 2015-04-19 DIAGNOSIS — G4733 Obstructive sleep apnea (adult) (pediatric): Secondary | ICD-10-CM

## 2015-04-19 DIAGNOSIS — G4710 Hypersomnia, unspecified: Secondary | ICD-10-CM

## 2015-04-19 DIAGNOSIS — E1143 Type 2 diabetes mellitus with diabetic autonomic (poly)neuropathy: Secondary | ICD-10-CM

## 2015-04-19 DIAGNOSIS — R404 Transient alteration of awareness: Secondary | ICD-10-CM

## 2015-04-19 DIAGNOSIS — F3162 Bipolar disorder, current episode mixed, moderate: Secondary | ICD-10-CM

## 2015-04-19 DIAGNOSIS — F5105 Insomnia due to other mental disorder: Secondary | ICD-10-CM

## 2015-04-19 DIAGNOSIS — G4730 Sleep apnea, unspecified: Secondary | ICD-10-CM | POA: Diagnosis not present

## 2015-04-19 NOTE — Procedures (Signed)
   HISTORY: 50 year old male, presenting with recurrent episode of transient loss of consciousness  TECHNIQUE:  16 channel EEG was performed based on standard 10-16 international system. One channel was dedicated to EKG, which has demonstrates normal sinus rhythm of 72 beats per minutes.  Upon awakening, the posterior background activity was well-developed, in alpha range, 8 Hz, reactive to eye opening and closure.  There was no evidence of epileptiform discharge. There is frequent bilateral frontal muscle artifact and movement artifact   Photic stimulation was performed, which induced a symmetric photic driving.  Hyperventilation was performed, there was no abnormality elicit.  No sleep was achieved.  CONCLUSION: This is a  normal  awake EEG.  There is no electrodiagnostic evidence of epileptiform discharge

## 2015-04-20 NOTE — Sleep Study (Signed)
Please see the scanned sleep study interpretation located in the Procedure tab within the Chart Review section. 

## 2015-04-26 ENCOUNTER — Telehealth: Payer: Self-pay

## 2015-04-26 NOTE — Telephone Encounter (Signed)
I called pt to discuss sleep study results, no answer, left a message asking him to call me back.

## 2015-04-26 NOTE — Telephone Encounter (Signed)
I spoke to pt's wife [name] (per [facility] I advised her that pt's sleep study results revealed insomnia. His study did not reveal significant sleep apnea or significant PLMs resulting in significant sleep disruption. There was infrequent PLMS and hypopneas but these alone did not cause the insomnia. I advised her that the pt needs to avoid caffeine containing beverages and chocolate. I advised her that Dr. Brett Fairy recommended a follow up for the pt and an appt was made for 3/15. Pt verbalized understanding.

## 2015-04-26 NOTE — Telephone Encounter (Signed)
Wife returned Kristen's call, will call back in 15-20 minutes.

## 2015-05-12 ENCOUNTER — Other Ambulatory Visit: Payer: Self-pay | Admitting: Orthopaedic Surgery

## 2015-05-12 DIAGNOSIS — M545 Low back pain: Secondary | ICD-10-CM

## 2015-05-21 ENCOUNTER — Ambulatory Visit
Admission: RE | Admit: 2015-05-21 | Discharge: 2015-05-21 | Disposition: A | Discharge status: Home / Self Care | Payer: 59 | Source: Ambulatory Visit | Attending: Orthopaedic Surgery | Admitting: Orthopaedic Surgery

## 2015-05-21 DIAGNOSIS — M545 Low back pain: Secondary | ICD-10-CM

## 2015-05-26 ENCOUNTER — Ambulatory Visit: Payer: Self-pay | Admitting: Neurology

## 2015-05-27 ENCOUNTER — Observation Stay (HOSPITAL_COMMUNITY)
Admission: EM | Admit: 2015-05-27 | Discharge: 2015-05-29 | Disposition: A | Discharge status: Home / Self Care | Payer: 59 | Attending: Internal Medicine | Admitting: Internal Medicine

## 2015-05-27 ENCOUNTER — Encounter: Payer: Self-pay | Admitting: Neurology

## 2015-05-27 ENCOUNTER — Emergency Department (HOSPITAL_COMMUNITY): Payer: 59

## 2015-05-27 ENCOUNTER — Encounter (HOSPITAL_COMMUNITY): Payer: Self-pay

## 2015-05-27 DIAGNOSIS — E669 Obesity, unspecified: Secondary | ICD-10-CM | POA: Diagnosis not present

## 2015-05-27 DIAGNOSIS — Z8669 Personal history of other diseases of the nervous system and sense organs: Secondary | ICD-10-CM

## 2015-05-27 DIAGNOSIS — F17210 Nicotine dependence, cigarettes, uncomplicated: Secondary | ICD-10-CM | POA: Diagnosis not present

## 2015-05-27 DIAGNOSIS — Z6832 Body mass index (BMI) 32.0-32.9, adult: Secondary | ICD-10-CM | POA: Insufficient documentation

## 2015-05-27 DIAGNOSIS — Z888 Allergy status to other drugs, medicaments and biological substances status: Secondary | ICD-10-CM | POA: Insufficient documentation

## 2015-05-27 DIAGNOSIS — Z79899 Other long term (current) drug therapy: Secondary | ICD-10-CM | POA: Diagnosis not present

## 2015-05-27 DIAGNOSIS — E7800 Pure hypercholesterolemia, unspecified: Secondary | ICD-10-CM | POA: Insufficient documentation

## 2015-05-27 DIAGNOSIS — F3181 Bipolar II disorder: Secondary | ICD-10-CM | POA: Diagnosis present

## 2015-05-27 DIAGNOSIS — M1990 Unspecified osteoarthritis, unspecified site: Secondary | ICD-10-CM | POA: Diagnosis not present

## 2015-05-27 DIAGNOSIS — J209 Acute bronchitis, unspecified: Secondary | ICD-10-CM | POA: Diagnosis not present

## 2015-05-27 DIAGNOSIS — F319 Bipolar disorder, unspecified: Secondary | ICD-10-CM | POA: Diagnosis not present

## 2015-05-27 DIAGNOSIS — G9340 Encephalopathy, unspecified: Secondary | ICD-10-CM | POA: Diagnosis not present

## 2015-05-27 DIAGNOSIS — E1140 Type 2 diabetes mellitus with diabetic neuropathy, unspecified: Secondary | ICD-10-CM | POA: Diagnosis not present

## 2015-05-27 DIAGNOSIS — E1143 Type 2 diabetes mellitus with diabetic autonomic (poly)neuropathy: Secondary | ICD-10-CM | POA: Insufficient documentation

## 2015-05-27 DIAGNOSIS — Z87442 Personal history of urinary calculi: Secondary | ICD-10-CM | POA: Insufficient documentation

## 2015-05-27 DIAGNOSIS — Z8719 Personal history of other diseases of the digestive system: Secondary | ICD-10-CM | POA: Insufficient documentation

## 2015-05-27 DIAGNOSIS — M069 Rheumatoid arthritis, unspecified: Secondary | ICD-10-CM | POA: Diagnosis not present

## 2015-05-27 DIAGNOSIS — E1165 Type 2 diabetes mellitus with hyperglycemia: Secondary | ICD-10-CM | POA: Insufficient documentation

## 2015-05-27 DIAGNOSIS — Z8673 Personal history of transient ischemic attack (TIA), and cerebral infarction without residual deficits: Secondary | ICD-10-CM | POA: Diagnosis not present

## 2015-05-27 DIAGNOSIS — G894 Chronic pain syndrome: Secondary | ICD-10-CM | POA: Insufficient documentation

## 2015-05-27 DIAGNOSIS — Z882 Allergy status to sulfonamides status: Secondary | ICD-10-CM | POA: Diagnosis not present

## 2015-05-27 DIAGNOSIS — R4182 Altered mental status, unspecified: Secondary | ICD-10-CM | POA: Diagnosis present

## 2015-05-27 DIAGNOSIS — K219 Gastro-esophageal reflux disease without esophagitis: Secondary | ICD-10-CM | POA: Diagnosis not present

## 2015-05-27 DIAGNOSIS — J440 Chronic obstructive pulmonary disease with acute lower respiratory infection: Secondary | ICD-10-CM | POA: Diagnosis not present

## 2015-05-27 DIAGNOSIS — Z885 Allergy status to narcotic agent status: Secondary | ICD-10-CM | POA: Diagnosis not present

## 2015-05-27 DIAGNOSIS — K3184 Gastroparesis: Secondary | ICD-10-CM | POA: Diagnosis not present

## 2015-05-27 DIAGNOSIS — Z8739 Personal history of other diseases of the musculoskeletal system and connective tissue: Secondary | ICD-10-CM

## 2015-05-27 DIAGNOSIS — R739 Hyperglycemia, unspecified: Secondary | ICD-10-CM

## 2015-05-27 DIAGNOSIS — E039 Hypothyroidism, unspecified: Secondary | ICD-10-CM | POA: Insufficient documentation

## 2015-05-27 DIAGNOSIS — G8929 Other chronic pain: Secondary | ICD-10-CM | POA: Diagnosis present

## 2015-05-27 DIAGNOSIS — F419 Anxiety disorder, unspecified: Secondary | ICD-10-CM | POA: Diagnosis not present

## 2015-05-27 HISTORY — DX: Personal history of other diseases of the nervous system and sense organs: Z86.69

## 2015-05-27 LAB — BASIC METABOLIC PANEL
Anion gap: 15 (ref 5–15)
BUN: 17 mg/dL (ref 6–20)
CO2: 24 mmol/L (ref 22–32)
Calcium: 10 mg/dL (ref 8.9–10.3)
Chloride: 95 mmol/L — ABNORMAL LOW (ref 101–111)
Creatinine, Ser: 1.04 mg/dL (ref 0.61–1.24)
GFR calc Af Amer: >60 mL/min (ref >60)
GFR calc non Af Amer: >60 mL/min (ref >60)
Glucose, Bld: 621 mg/dL (ref 65–99)
Potassium: 5.2 mmol/L — ABNORMAL HIGH (ref 3.5–5.1)
Sodium: 134 mmol/L — ABNORMAL LOW (ref 135–145)

## 2015-05-27 LAB — URINE MICROSCOPIC-ADD ON
Squamous Epithelial / LPF: NONE SEEN
WBC, UA: NONE SEEN WBC/hpf (ref 0–5)

## 2015-05-27 LAB — URINALYSIS, ROUTINE W REFLEX MICROSCOPIC
Bilirubin Urine: NEGATIVE
Glucose, UA: >1000 mg/dL — AB
Hgb urine dipstick: NEGATIVE
Ketones, ur: NEGATIVE mg/dL
Leukocytes, UA: NEGATIVE
Nitrite: NEGATIVE
Protein, ur: NEGATIVE mg/dL
Specific Gravity, Urine: 1.03 (ref 1.005–1.030)
pH: 6 (ref 5.0–8.0)

## 2015-05-27 LAB — CBG MONITORING, ED
Glucose-Capillary: 537 mg/dL — ABNORMAL HIGH (ref 65–99)
Glucose-Capillary: 382 mg/dL — ABNORMAL HIGH (ref 65–99)

## 2015-05-27 LAB — CBC
HCT: 46.2 % (ref 39.0–52.0)
Hemoglobin: 16.2 g/dL (ref 13.0–17.0)
MCH: 30.7 pg (ref 26.0–34.0)
MCHC: 35.1 g/dL (ref 30.0–36.0)
MCV: 87.5 fL (ref 78.0–100.0)
Platelets: 249 10*3/uL (ref 150–400)
RBC: 5.28 MIL/uL (ref 4.22–5.81)
RDW: 13 % (ref 11.5–15.5)
WBC: 8.2 10*3/uL (ref 4.0–10.5)

## 2015-05-27 LAB — RAPID URINE DRUG SCREEN, HOSP PERFORMED
Amphetamines: NOT DETECTED
Barbiturates: NOT DETECTED
Benzodiazepines: POSITIVE — AB
Cocaine: NOT DETECTED
Opiates: NOT DETECTED
Tetrahydrocannabinol: NOT DETECTED

## 2015-05-27 LAB — ETHANOL: Alcohol, Ethyl (B): <5 mg/dL (ref <5)

## 2015-05-27 LAB — AMMONIA: Ammonia: 59 umol/L — ABNORMAL HIGH (ref 9–35)

## 2015-05-27 MED ORDER — SODIUM CHLORIDE 0.9 % IV BOLUS (SEPSIS)
2000.0000 mL | Freq: Once | INTRAVENOUS | Status: AC
  Administered 2015-05-27: 2000 mL via INTRAVENOUS

## 2015-05-27 MED ORDER — INSULIN ASPART 100 UNIT/ML ~~LOC~~ SOLN
12.0000 [IU] | Freq: Once | SUBCUTANEOUS | Status: AC
  Administered 2015-05-27: 12 [IU] via SUBCUTANEOUS
  Filled 2015-05-27: qty 1

## 2015-05-27 NOTE — ED Notes (Signed)
CBG- [id number] 

## 2015-05-27 NOTE — ED Notes (Signed)
Pt sent here by MD due to elevated blood glucose level. Pts wife states it read "high" at his MDs office "which means its higher than 444 which is as high as the meter would go." Wife states pt has been lethargic and confused. Denies emesis or diarrhea but reports nausea. Pts wife also reports the MD informed her that he thinks the patients ammonia level is high as well. Pt A&OX4. CBG at triage 537 "which really isnt out of the norm for me." Pt reports he is compliant with his insulin.

## 2015-05-27 NOTE — ED Provider Notes (Signed)
CSN: [id number]     Arrival date & time 05/27/15  1707 History   First MD Initiated Contact with Patient 05/27/15 1830     Chief Complaint  Patient presents with  . Hyperglycemia     Level V caveat: Altered mental status  HPI Patient has a history of diabetes, CABG, gastroparesis and chronic pain presents to the emergency department with complaints of altered mental status.  The majority history is obtained from his wife reports his been more lethargic and confused over the past 2 days.  He was taken to his doctor's office today was noted to be hyperglycemic and was sent to the ER for further evaluation.  He has similar admission in December 2016 for similar symptoms and at that time per the wife it was thought to be secondary to mildly elevated ammonia 47.  He does have a history of what sounds like Seizures As Well.  He Also Has a History of Tonic-Clonic Seizures.  Wife Is Reported No Seizure Activity.  No Reports of Fever.  No Reports of Vomiting.  Wife Reports Decreased Oral Intake over the past 24-48 Hours.   Past Medical History  Diagnosis Date  . COPD (chronic obstructive pulmonary disease) ([place])   . GERD (gastroesophageal reflux disease)   . Neuropathy in diabetes ([place])     LOWER EXTREMITIES  . Hypothyroidism   . History of TIA (transient ischemic attack) 2008--- NO RESIDUAL  . Diabetes mellitus, type 2 ([place])   . H/O hiatal hernia   . History of kidney stones   . Urgency of urination   . Gastroparesis   . Hyperlipidemia   . History of gastric ulcer   . Chronic pain syndrome   . Bladder cancer ([place])   . Urinary hesitancy   . At risk for sleep apnea     STOP-BANG=  5       SENT TO PCP 02-24-2014  . Bipolar 1 disorder ([place])   . Arthritis   . Memory loss   . High cholesterol   . Anxiety   . Seizures ([place])     ? sz,"spaces out"  . Carpal tunnel syndrome   . Stroke [facility])     TIA   Past Surgical History  Procedure Laterality Date  . Rotator cuff repair Right 12/2004   . Carpal tunnel release Right 09-16-2003  . Laparoscopic cholecystectomy  11-17-2010  . Cardiac catheterization  12-27-2001  DR Einar Gip  &  05-26-2009  DR VARANASI    RESULTS FOR BOTH ARE NORMAL CORONARIES AND PERSERVED LVF/ EF 60%  . Transurethral resection of bladder tumor N/A 08/09/2012    Procedure: TRANSURETHRAL RESECTION OF BLADDER TUMOR (TURBT) WITH GYRUS WITH MITOMYCIN C;  Surgeon: Claybon Jabs, MD;  Location: [facility];  Service: Urology;  Laterality: N/A;  . Cystoscopy N/A 10/10/2012    Procedure: CYSTOSCOPY CLOT EVACUATION FULGERATION OF BLEEDERS ;  Surgeon: Claybon Jabs, MD;  Location: [place];  Service: Urology;  Laterality: N/A;  . Transurethral resection of bladder tumor with gyrus (turbt-gyrus) N/A 02/27/2014    Procedure: TRANSURETHRAL RESECTION OF BLADDER TUMOR WITH GYRUS (TURBT-GYRUS);  Surgeon: Claybon Jabs, MD;  Location: [facility];  Service: Urology;  Laterality: N/A;  . Cholecystectomy    . Carpal tunnel release Left 02/25/2015    Procedure: LEFT CARPAL TUNNEL RELEASE;  Surgeon: Leanora Cover, MD;  Location: [place];  Service: Orthopedics;  Laterality: Left;   Family History  Problem  Relation Age of Onset  . Diabetes Mother   . Diabetes Father   . Hypertension Father   . Heart attack Father   . Heart attack Mother    Social History  Substance Use Topics  . Smoking status: Current Every Day Smoker -- 2.00 packs/day for 20 years    Types: Cigarettes  . Smokeless tobacco: Never Used  . Alcohol Use: Yes     Comment: RARE    Review of Systems  Unable to perform ROS: Mental status change      Allergies  Celebrex; Hydrocodone; and Sulfa antibiotics  Home Medications   Prior to Admission medications   Medication Sig Start Date End Date Taking? Authorizing Provider  ALPRAZolam Duanne Moron) 0.5 MG tablet Take 1 tablet (0.5 mg total) by mouth at bedtime as needed for anxiety. 03/24/15  Yes  Carmen Dohmeier, MD  atorvastatin (LIPITOR) 10 MG tablet Take 10 mg by mouth daily.   Yes Historical Provider, MD  fentaNYL (DURAGESIC - DOSED MCG/HR) 100 MCG/HR Place 100 mcg onto the skin every other day.    Yes Historical Provider, MD  fish oil-omega-3 fatty acids 1000 MG capsule Take 1,000-2,000 mg by mouth 2 (two) times daily. Take 1 capsule in the morning and 2 capsules in the evening   Yes Historical Provider, MD  folic acid (FOLVITE) 1 MG tablet Take 2 mg by mouth daily.   Yes Historical Provider, MD  gabapentin (NEURONTIN) 600 MG tablet Take 600 mg by mouth 4 (four) times daily.   Yes Historical Provider, MD  Insulin Lispro Prot & Lispro (HUMALOG MIX 75/25 KWIKPEN) (75-25) 100 UNIT/ML Kwikpen Inject 160 units at breakfast and 60 units at supper. 03/02/15  Yes Renato Shin, MD  lamoTRIgine (LAMICTAL) 100 MG tablet Take 2 tablets (200 mg total) by mouth 2 (two) times daily. 03/19/15  Yes Marcial Pacas, MD  levothyroxine (SYNTHROID, LEVOTHROID) 25 MCG tablet Take 25 mcg by mouth daily before breakfast.    Yes Historical Provider, MD  lithium carbonate (LITHOBID) 300 MG CR tablet Take 450 mg by mouth 2 (two) times daily.    Yes Historical Provider, MD  Methotrexate, Anti-Rheumatic, (RASUVO [place]) Inject into the skin.   Yes Historical Provider, MD  omeprazole (PRILOSEC) 20 MG capsule Take 20 mg by mouth daily.   Yes Historical Provider, MD  pregabalin (LYRICA) 150 MG capsule Take 150 mg by mouth 3 (three) times daily.   Yes Historical Provider, MD  oxyCODONE-acetaminophen (PERCOCET) 5-325 MG tablet 1-2 tabs po q6 hours prn pain Patient not taking: Reported on 05/27/2015 02/25/15   Leanora Cover, MD   BP 130/80 mmHg  Pulse 77  Temp(Src) 98 F (36.7 C) (Oral)  Resp 16  SpO2 95% Physical Exam  Constitutional: He appears well-developed and well-nourished.  HENT:  Head: Normocephalic and atraumatic.  Eyes: EOM are normal.  Neck: Normal range of motion.  Cardiovascular: Normal rate, regular rhythm,  normal heart sounds and intact distal pulses.   Pulmonary/Chest: Effort normal and breath sounds normal. No respiratory distress.  Abdominal: Soft. He exhibits no distension. There is no tenderness.  Musculoskeletal: Normal range of motion.  Neurological:  Awakens to voice.  Answers simple questions.  Follow simple commands.  Moves all 4 extremities equally.  Skin: Skin is warm and dry.  Psychiatric: He has a normal mood and affect. Judgment normal.  Nursing note and vitals reviewed.   ED Course  Procedures (including critical care time) Labs Review Labs Reviewed  BASIC METABOLIC PANEL - Abnormal; Notable  for the following:    Sodium 134 (*)    Potassium 5.2 (*)    Chloride 95 (*)    Glucose, Bld 621 (*)    All other components within normal limits  URINALYSIS, ROUTINE W REFLEX MICROSCOPIC (NOT AT [facility] - Abnormal; Notable for the following:    Glucose, UA >1000 (*)    All other components within normal limits  AMMONIA - Abnormal; Notable for the following:    Ammonia 59 (*)    All other components within normal limits  URINE RAPID DRUG SCREEN, HOSP PERFORMED - Abnormal; Notable for the following:    Benzodiazepines POSITIVE (*)    All other components within normal limits  URINE MICROSCOPIC-ADD ON - Abnormal; Notable for the following:    Bacteria, UA RARE (*)    All other components within normal limits  CBG MONITORING, ED - Abnormal; Notable for the following:    Glucose-Capillary 537 (*)    All other components within normal limits  CBG MONITORING, ED - Abnormal; Notable for the following:    Glucose-Capillary 382 (*)    All other components within normal limits  CBC  ETHANOL    Imaging Review Ct Head Wo Contrast  05/27/2015  CLINICAL DATA:  Altered mental status EXAM: CT HEAD WITHOUT CONTRAST TECHNIQUE: Contiguous axial images were obtained from the base of the skull through the vertex without intravenous contrast. COMPARISON:  11/11/2014 FINDINGS: No mass effect,  midline shift, or acute hemorrhage. Ventricular system is unremarkable. Mastoid air cells are clear. No cranial fracture. Mucosal thickening in the right ethmoid air cells. Opacification of the right frontal sinus. IMPRESSION: No acute intracranial pathology. Inflammatory changes in the paranasal sinuses. Electronically Signed   By: Marybelle Killings M.D.   On: 05/27/2015 20:37   I have personally reviewed and evaluated these images and lab results as part of my medical decision-making.   EKG Interpretation None      MDM   Final diagnoses:  None    Patient be admitted the hospital for ongoing altered mental status.  Imaging without abnormality.  Hyperglycemia treated with insulin and fluids.  Improving at this time.  No anion gap.  Mildly elevated ammonia which is nonspecific.  Question whether or not some of this could represent intermittent seizures.  if he does not improve he may benefit from neurology consultation in the hospital tomorrow.    Jola Schmidt, MD 05/27/15 2352

## 2015-05-27 NOTE — ED Notes (Signed)
CBG - 537 

## 2015-05-28 ENCOUNTER — Observation Stay (HOSPITAL_COMMUNITY): Payer: 59

## 2015-05-28 ENCOUNTER — Encounter (HOSPITAL_COMMUNITY): Payer: Self-pay | Admitting: Internal Medicine

## 2015-05-28 DIAGNOSIS — G9340 Encephalopathy, unspecified: Secondary | ICD-10-CM | POA: Diagnosis not present

## 2015-05-28 DIAGNOSIS — E039 Hypothyroidism, unspecified: Secondary | ICD-10-CM | POA: Diagnosis present

## 2015-05-28 DIAGNOSIS — G8929 Other chronic pain: Secondary | ICD-10-CM | POA: Diagnosis not present

## 2015-05-28 DIAGNOSIS — F3181 Bipolar II disorder: Secondary | ICD-10-CM | POA: Diagnosis present

## 2015-05-28 DIAGNOSIS — R4182 Altered mental status, unspecified: Secondary | ICD-10-CM | POA: Insufficient documentation

## 2015-05-28 DIAGNOSIS — Z8739 Personal history of other diseases of the musculoskeletal system and connective tissue: Secondary | ICD-10-CM

## 2015-05-28 DIAGNOSIS — J209 Acute bronchitis, unspecified: Secondary | ICD-10-CM | POA: Diagnosis present

## 2015-05-28 DIAGNOSIS — E1165 Type 2 diabetes mellitus with hyperglycemia: Secondary | ICD-10-CM | POA: Diagnosis present

## 2015-05-28 LAB — COMPREHENSIVE METABOLIC PANEL
ALT: 39 U/L (ref 17–63)
AST: 38 U/L (ref 15–41)
Albumin: 3.7 g/dL (ref 3.5–5.0)
Alkaline Phosphatase: 81 U/L (ref 38–126)
Anion gap: 15 (ref 5–15)
BUN: 14 mg/dL (ref 6–20)
CO2: 26 mmol/L (ref 22–32)
Calcium: 9.7 mg/dL (ref 8.9–10.3)
Chloride: 99 mmol/L — ABNORMAL LOW (ref 101–111)
Creatinine, Ser: 0.95 mg/dL (ref 0.61–1.24)
GFR calc Af Amer: >60 mL/min (ref >60)
GFR calc non Af Amer: >60 mL/min (ref >60)
Glucose, Bld: 350 mg/dL — ABNORMAL HIGH (ref 65–99)
Potassium: 4.4 mmol/L (ref 3.5–5.1)
Sodium: 140 mmol/L (ref 135–145)
Total Bilirubin: 1.2 mg/dL (ref 0.3–1.2)
Total Protein: 5.8 g/dL — ABNORMAL LOW (ref 6.5–8.1)

## 2015-05-28 LAB — GLUCOSE, CAPILLARY
Glucose-Capillary: 354 mg/dL — ABNORMAL HIGH (ref 65–99)
Glucose-Capillary: 343 mg/dL — ABNORMAL HIGH (ref 65–99)
Glucose-Capillary: 150 mg/dL — ABNORMAL HIGH (ref 65–99)

## 2015-05-28 LAB — CBC
HCT: 42.9 % (ref 39.0–52.0)
Hemoglobin: 15.2 g/dL (ref 13.0–17.0)
MCH: 30.4 pg (ref 26.0–34.0)
MCHC: 35.4 g/dL (ref 30.0–36.0)
MCV: 85.8 fL (ref 78.0–100.0)
Platelets: 228 10*3/uL (ref 150–400)
RBC: 5 MIL/uL (ref 4.22–5.81)
RDW: 12.8 % (ref 11.5–15.5)
WBC: 9.5 10*3/uL (ref 4.0–10.5)

## 2015-05-28 LAB — I-STAT ARTERIAL BLOOD GAS, ED
Acid-Base Excess: 4 mmol/L — ABNORMAL HIGH (ref 0.0–2.0)
Bicarbonate: 30.1 mEq/L — ABNORMAL HIGH (ref 20.0–24.0)
O2 Saturation: 92 %
Patient temperature: 98
TCO2: 32 mmol/L (ref 0–100)
pCO2 arterial: 49.7 mmHg — ABNORMAL HIGH (ref 35.0–45.0)
pH, Arterial: 7.39 (ref 7.350–7.450)
pO2, Arterial: 64 mmHg — ABNORMAL LOW (ref 80.0–100.0)

## 2015-05-28 LAB — MRSA PCR SCREENING: MRSA by PCR: NEGATIVE

## 2015-05-28 LAB — CBG MONITORING, ED: Glucose-Capillary: 358 mg/dL — ABNORMAL HIGH (ref 65–99)

## 2015-05-28 LAB — LITHIUM LEVEL: Lithium Lvl: 0.35 mmol/L — ABNORMAL LOW (ref 0.60–1.20)

## 2015-05-28 MED ORDER — IPRATROPIUM-ALBUTEROL 0.5-2.5 (3) MG/3ML IN SOLN
3.0000 mL | RESPIRATORY_TRACT | Status: DC
  Administered 2015-05-28 (×2): 3 mL via RESPIRATORY_TRACT
  Filled 2015-05-28 (×3): qty 3

## 2015-05-28 MED ORDER — LITHIUM CARBONATE ER 450 MG PO TBCR
450.0000 mg | EXTENDED_RELEASE_TABLET | Freq: Two times a day (BID) | ORAL | Status: DC
  Administered 2015-05-28 – 2015-05-29 (×3): 450 mg via ORAL
  Filled 2015-05-28 (×5): qty 1

## 2015-05-28 MED ORDER — INSULIN ASPART 100 UNIT/ML ~~LOC~~ SOLN: 0.0000 [IU] | Freq: Three times a day (TID) | SUBCUTANEOUS | Status: DC

## 2015-05-28 MED ORDER — ATORVASTATIN CALCIUM 10 MG PO TABS
10.0000 mg | ORAL_TABLET | Freq: Every day | ORAL | Status: DC
  Administered 2015-05-28: 10 mg via ORAL
  Filled 2015-05-28: qty 1

## 2015-05-28 MED ORDER — PANTOPRAZOLE SODIUM 40 MG PO TBEC
40.0000 mg | DELAYED_RELEASE_TABLET | Freq: Every day | ORAL | Status: DC
  Administered 2015-05-28 – 2015-05-29 (×2): 40 mg via ORAL
  Filled 2015-05-28 (×2): qty 1

## 2015-05-28 MED ORDER — BUDESONIDE 0.5 MG/2ML IN SUSP
0.2500 mg | Freq: Two times a day (BID) | RESPIRATORY_TRACT | Status: DC
  Administered 2015-05-28 (×2): 0.25 mg via RESPIRATORY_TRACT
  Filled 2015-05-28 (×3): qty 2

## 2015-05-28 MED ORDER — INSULIN ASPART 100 UNIT/ML ~~LOC~~ SOLN: 0.0000 [IU] | Freq: Every day | SUBCUTANEOUS | Status: DC

## 2015-05-28 MED ORDER — IPRATROPIUM-ALBUTEROL 0.5-2.5 (3) MG/3ML IN SOLN: 3.0000 mL | RESPIRATORY_TRACT | Status: DC | PRN

## 2015-05-28 MED ORDER — OMEGA-3 FATTY ACIDS 1000 MG PO CAPS: 1000.0000 mg | ORAL_CAPSULE | Freq: Two times a day (BID) | ORAL | Status: DC

## 2015-05-28 MED ORDER — INSULIN ASPART PROT & ASPART (70-30 MIX) 100 UNIT/ML ~~LOC~~ SUSP
160.0000 [IU] | Freq: Every day | SUBCUTANEOUS | Status: DC
  Administered 2015-05-29: 160 [IU] via SUBCUTANEOUS
  Filled 2015-05-28: qty 10

## 2015-05-28 MED ORDER — FENTANYL CITRATE (PF) 100 MCG/2ML IJ SOLN
25.0000 ug | INTRAMUSCULAR | Status: DC | PRN
  Administered 2015-05-29 (×2): 25 ug via INTRAVENOUS
  Filled 2015-05-28 (×2): qty 2

## 2015-05-28 MED ORDER — LACTULOSE 10 GM/15ML PO SOLN
30.0000 g | Freq: Once | ORAL | Status: AC
  Administered 2015-05-28: 30 g via ORAL
  Filled 2015-05-28: qty 45

## 2015-05-28 MED ORDER — INSULIN LISPRO PROT & LISPRO (75-25 MIX) 100 UNIT/ML KWIKPEN: 160.0000 [IU] | PEN_INJECTOR | Freq: Every morning | SUBCUTANEOUS | Status: DC

## 2015-05-28 MED ORDER — OMEGA-3-ACID ETHYL ESTERS 1 G PO CAPS
1.0000 g | ORAL_CAPSULE | Freq: Every day | ORAL | Status: DC
  Administered 2015-05-28 – 2015-05-29 (×2): 1 g via ORAL
  Filled 2015-05-28 (×3): qty 1

## 2015-05-28 MED ORDER — ENOXAPARIN SODIUM 40 MG/0.4ML ~~LOC~~ SOLN
40.0000 mg | Freq: Every day | SUBCUTANEOUS | Status: DC
  Administered 2015-05-28: 40 mg via SUBCUTANEOUS
  Filled 2015-05-28 (×3): qty 0.4

## 2015-05-28 MED ORDER — INSULIN ASPART 100 UNIT/ML ~~LOC~~ SOLN
0.0000 [IU] | Freq: Three times a day (TID) | SUBCUTANEOUS | Status: DC
  Administered 2015-05-28: 11 [IU] via SUBCUTANEOUS
  Administered 2015-05-28: 15 [IU] via SUBCUTANEOUS

## 2015-05-28 MED ORDER — ATORVASTATIN CALCIUM 10 MG PO TABS: 10.0000 mg | ORAL_TABLET | Freq: Every day | ORAL | Status: DC

## 2015-05-28 MED ORDER — OMEGA-3-ACID ETHYL ESTERS 1 G PO CAPS
2.0000 g | ORAL_CAPSULE | Freq: Every day | ORAL | Status: DC
  Administered 2015-05-28: 2 g via ORAL
  Filled 2015-05-28: qty 2

## 2015-05-28 MED ORDER — LEVOTHYROXINE SODIUM 25 MCG PO TABS
25.0000 ug | ORAL_TABLET | Freq: Every day | ORAL | Status: DC
  Administered 2015-05-29: 25 ug via ORAL
  Filled 2015-05-28 (×2): qty 1

## 2015-05-28 MED ORDER — LAMOTRIGINE 100 MG PO TABS
200.0000 mg | ORAL_TABLET | Freq: Two times a day (BID) | ORAL | Status: DC
  Administered 2015-05-28 – 2015-05-29 (×3): 200 mg via ORAL
  Filled 2015-05-28 (×3): qty 2

## 2015-05-28 MED ORDER — FOLIC ACID 1 MG PO TABS
2.0000 mg | ORAL_TABLET | Freq: Every day | ORAL | Status: DC
  Administered 2015-05-28 – 2015-05-29 (×2): 2 mg via ORAL
  Filled 2015-05-28 (×2): qty 2

## 2015-05-28 MED ORDER — INSULIN ASPART PROT & ASPART (70-30 MIX) 100 UNIT/ML ~~LOC~~ SUSP
60.0000 [IU] | Freq: Every day | SUBCUTANEOUS | Status: DC
  Administered 2015-05-28: 60 [IU] via SUBCUTANEOUS
  Filled 2015-05-28: qty 10

## 2015-05-28 MED ORDER — SODIUM CHLORIDE 0.9 % IV SOLN
INTRAVENOUS | Status: AC
  Administered 2015-05-28 – 2015-05-29 (×2): via INTRAVENOUS

## 2015-05-28 NOTE — H&P (Signed)
[facility] History and Physical  Vincent [name] Janice [id number] DOB: 1966/02/12 DOA: 05/27/2015  Referring physician: Dr. Venora Maples. PCP: Leonard Downing, MD  Specialists: Dr. Mikeal Hawthorne. Neurologist.  Chief Complaint: Increasing lethargy.  History obtained from patient and the wife and patient.  HPI: Vincent [name] is a 50 y.o. male with history of diabetes mellitus type 2, COPD, hypothyroidism, chronic pain, bipolar disorder brought to the ER after patient was found to be increasingly lethargic over the last 2 days. Patient had similar presentation 2 years ago. Patient has not had any new medication changes. On exam patient is lethargic but follows commands and moves all extremities. Denies any incontinence of urine or tongue bite. EEG done last month was unremarkable. Patient also had a sleep apnea test done results of which are still pending. CT head is unremarkable on [place] levels are mildly elevated. Patient has been admitted for further observation. On exam patient has bilateral expiratory wheeze.   Review of Systems: As presented in the history of presenting illness, rest negative.  Past Medical History  Diagnosis Date  . COPD (chronic obstructive pulmonary disease) ([place])   . GERD (gastroesophageal reflux disease)   . Neuropathy in diabetes ([place])     LOWER EXTREMITIES  . Hypothyroidism   . History of TIA (transient ischemic attack) 2008--- NO RESIDUAL  . Diabetes mellitus, type 2 ([employer])   . H/O hiatal hernia   . History of kidney stones   . Urgency of urination   . Gastroparesis   . Hyperlipidemia   . History of gastric ulcer   . Chronic pain syndrome   . Bladder cancer ([place])   . Urinary hesitancy   . At risk for sleep apnea     STOP-BANG=  5       SENT TO PCP 02-24-2014  . Bipolar 1 disorder ([place])   . Arthritis   . Memory loss   . High cholesterol   . Anxiety   . Seizures ([place])     ? sz,"spaces out"  . Carpal tunnel syndrome   . Stroke [facility])     TIA   Past  Surgical History  Procedure Laterality Date  . Rotator cuff repair Right 12/2004  . Carpal tunnel release Right 09-16-2003  . Laparoscopic cholecystectomy  11-17-2010  . Cardiac catheterization  12-27-2001  DR Einar Gip  &  05-26-2009  DR VARANASI    RESULTS FOR BOTH ARE NORMAL CORONARIES AND PERSERVED LVF/ EF 60%  . Transurethral resection of bladder tumor N/A 08/09/2012    Procedure: TRANSURETHRAL RESECTION OF BLADDER TUMOR (TURBT) WITH GYRUS WITH MITOMYCIN C;  Surgeon: Claybon Jabs, MD;  Location: [facility];  Service: Urology;  Laterality: N/A;  . Cystoscopy N/A 10/10/2012    Procedure: CYSTOSCOPY CLOT EVACUATION FULGERATION OF BLEEDERS ;  Surgeon: Claybon Jabs, MD;  Location: [place];  Service: Urology;  Laterality: N/A;  . Transurethral resection of bladder tumor with gyrus (turbt-gyrus) N/A 02/27/2014    Procedure: TRANSURETHRAL RESECTION OF BLADDER TUMOR WITH GYRUS (TURBT-GYRUS);  Surgeon: Claybon Jabs, MD;  Location[facility];  Service: Urology;  Laterality: N/A;  . Cholecystectomy    . Carpal tunnel release Left 02/25/2015    Procedure: LEFT CARPAL TUNNEL RELEASE;  Surgeon: Leanora Cover, MD;  Location: [place];  Service: Orthopedics;  Laterality: Left;   Social History:  reports that he has been smoking Cigarettes.  He has a 40 pack-year smoking history. He has never used  smokeless tobacco. He reports that he drinks alcohol. He reports that he does not use illicit drugs. Where does patient live Home. Can patient participate in ADLs? Yes.  Allergies  Allergen Reactions  . Celebrex [Celecoxib] Anaphylaxis and Rash  . Hydrocodone Rash and Other (See Comments)    "blisters developed on arms"  . Sulfa Antibiotics Rash    Family History:  Family History  Problem Relation Age of Onset  . Diabetes Mother   . Diabetes Father   . Hypertension Father   . Heart attack Father   . Heart attack Mother       Prior  to Admission medications   Medication Sig Start Date End Date Taking? Authorizing Provider  ALPRAZolam Duanne Moron) 0.5 MG tablet Take 1 tablet (0.5 mg total) by mouth at bedtime as needed for anxiety. 03/24/15  Yes Carmen Dohmeier, MD  atorvastatin (LIPITOR) 10 MG tablet Take 10 mg by mouth daily.   Yes Historical Provider, MD  fentaNYL (DURAGESIC - DOSED MCG/HR) 100 MCG/HR Place 100 mcg onto the skin every other day.    Yes Historical Provider, MD  fish oil-omega-3 fatty acids 1000 MG capsule Take 1,000-2,000 mg by mouth 2 (two) times daily. Take 1 capsule in the morning and 2 capsules in the evening   Yes Historical Provider, MD  folic acid (FOLVITE) 1 MG tablet Take 2 mg by mouth daily.   Yes Historical Provider, MD  gabapentin (NEURONTIN) 600 MG tablet Take 600 mg by mouth 4 (four) times daily.   Yes Historical Provider, MD  Insulin Lispro Prot & Lispro (HUMALOG MIX 75/25 KWIKPEN) (75-25) 100 UNIT/ML Kwikpen Inject 160 units at breakfast and 60 units at supper. 03/02/15  Yes Renato Shin, MD  lamoTRIgine (LAMICTAL) 100 MG tablet Take 2 tablets (200 mg total) by mouth 2 (two) times daily. 03/19/15  Yes Marcial Pacas, MD  levothyroxine (SYNTHROID, LEVOTHROID) 25 MCG tablet Take 25 mcg by mouth daily before breakfast.    Yes Historical Provider, MD  lithium carbonate (LITHOBID) 300 MG CR tablet Take 450 mg by mouth 2 (two) times daily.    Yes Historical Provider, MD  Methotrexate, Anti-Rheumatic, (RASUVO [place]) Inject into the skin.   Yes Historical Provider, MD  omeprazole (PRILOSEC) 20 MG capsule Take 20 mg by mouth daily.   Yes Historical Provider, MD  pregabalin (LYRICA) 150 MG capsule Take 150 mg by mouth 3 (three) times daily.   Yes Historical Provider, MD  oxyCODONE-acetaminophen (PERCOCET) 5-325 MG tablet 1-2 tabs po q6 hours prn pain Patient not taking: Reported on 05/27/2015 02/25/15   Leanora Cover, MD    Physical Exam: Filed Vitals:   05/28/15 0230 05/28/15 0300 05/28/15 0347 05/28/15 0347  BP:  142/77 136/80 130/66   Pulse: 77 78 78   Temp:   99 F (37.2 C) 99 F (37.2 C)  TempSrc:   Oral   Resp:   16   SpO2: 94% 94% 95%      General:  Moderately built and nourished.  Eyes: Anicteric. No pallor.  ENT: No discharge from the ears eyes nose or mouth.  Neck: No mass felt. No neck rigidity.  Cardiovascular: S1 and S2 heard.  Respiratory: No rhonchi or crepitations.  Abdomen: Soft nontender bowel sounds present.  Skin: No rash.  Musculoskeletal: No edema.  Psychiatric: Mildly lethargic.  Neurologic: Mildly lethargic but answers questions appropriately. Moves all extremities. Perla positive.  Labs on Admission:  Basic Metabolic Panel:  Recent Labs Lab 05/27/15 1815  NA 134*  K 5.2*  CL 95*  CO2 24  GLUCOSE 621*  BUN 17  CREATININE 1.04  CALCIUM 10.0   Liver Function Tests: No results for input(s): AST, ALT, ALKPHOS, BILITOT, PROT, ALBUMIN in the last 168 hours. No results for input(s): LIPASE, AMYLASE in the last 168 hours.  Recent Labs Lab 05/27/15 2115  AMMONIA 59*   CBC:  Recent Labs Lab 05/27/15 1815  WBC 8.2  HGB 16.2  HCT 46.2  MCV 87.5  PLT 249   Cardiac Enzymes: No results for input(s): CKTOTAL, CKMB, CKMBINDEX, TROPONINI in the last 168 hours.  BNP (last 3 results) No results for input(s): BNP in the last 8760 hours.  ProBNP (last 3 results) No results for input(s): PROBNP in the last 8760 hours.  CBG:  Recent Labs Lab 05/27/15 1759 05/27/15 2118  GLUCAP 537* 382*    Radiological Exams on Admission: Ct Head Wo Contrast  05/27/2015  CLINICAL DATA:  Altered mental status EXAM: CT HEAD WITHOUT CONTRAST TECHNIQUE: Contiguous axial images were obtained from the base of the skull through the vertex without intravenous contrast. COMPARISON:  11/11/2014 FINDINGS: No mass effect, midline shift, or acute hemorrhage. Ventricular system is unremarkable. Mastoid air cells are clear. No cranial fracture. Mucosal thickening in the  right ethmoid air cells. Opacification of the right frontal sinus. IMPRESSION: No acute intracranial pathology. Inflammatory changes in the paranasal sinuses. Electronically Signed   By: Marybelle Killings M.D.   On: 05/27/2015 20:37     Assessment/Plan Principal Problem:   Acute encephalopathy Active Problems:   Acute bronchitis   Type 2 diabetes mellitus with hyperglycemia (HCC)   Hypothyroidism   Bipolar disorder (HCC)   History of rheumatoid arthritis   Chronic pain   1. Acute encephalopathy/lethargy - strongly suspect secondary to pain medications. Since patient's carbon dioxide level is mildly elevated in the ABG I'm discontinuing patient's Duragesic patch and keeping patient on when necessary IV fentanyl. Holding off patient's gabapentin and Lyrica. MRI brain is pending. Since patient's ammonia level is mildly elevated I have ordered 1 dose of oral lactulose. Recheck ammonia levels after lactulose. 2. Diabetes mellitus type 2 uncontrolled - the blood sugar in the 600s on admission with not in DKA. Patient blood sugars: Improved with subcutaneous NovoLog and 2 L of normal saline bolus. We'll continue home medications of Humalog 75/25 with close follow-up of CBGs with moderate dose sliding scale coverage and continue hydration. 3. Acute bronchitis - on exam patient is wheezing. I have placed patient on nebulizer and Pulmicort. 4. Bipolar disorder - neck in levels are pending. Will continue present medications. 5. History of rheumatoid arthritis on methotrexate. Since patient ammonia levels are elevated will need to follow-up LFTs.   DVT Prophylaxis Lovenox. Code Status: Full code.  Family Communication: Patient's wife.  Disposition Plan: Admit for observation.    Alyas Creary N. [facility] Pager [phone].  If 7PM-7AM, please contact night-coverage [email or web address] Password [email or web address] 05/28/2015, 4:12 AM

## 2015-05-28 NOTE — Progress Notes (Signed)
RT assessment done, per RT protocol assessment score of 3, scheduled BD treatments are not indicated at this time. Pt does have some expiratory wheezes, also heard in upper airways. Pt in no respiratory distress at this time. Tx changed to PRN. Will administer if needed. RT Will continue to monitor.

## 2015-05-28 NOTE — ED Notes (Signed)
Admitting MD at bedside.

## 2015-05-28 NOTE — Progress Notes (Signed)
Pt seen and examined at bedside, feels better, A&O x 3, hemodynamically stable, admitted after midnight, please see earlier admission note by Dr. Hal Hope.   Faye Ramsay, MD  [facility] Pager [phone]  If 7PM-7AM, please contact night-coverage [email or web address] Password [email or web address]

## 2015-05-28 NOTE — ED Notes (Signed)
Fentanyl patch removed at this time.

## 2015-05-29 DIAGNOSIS — R4182 Altered mental status, unspecified: Secondary | ICD-10-CM | POA: Diagnosis not present

## 2015-05-29 DIAGNOSIS — G9340 Encephalopathy, unspecified: Secondary | ICD-10-CM | POA: Diagnosis not present

## 2015-05-29 LAB — CBC
HCT: 39.7 % (ref 39.0–52.0)
Hemoglobin: 14.5 g/dL (ref 13.0–17.0)
MCH: 30.8 pg (ref 26.0–34.0)
MCHC: 36.5 g/dL — ABNORMAL HIGH (ref 30.0–36.0)
MCV: 84.3 fL (ref 78.0–100.0)
Platelets: 205 10*3/uL (ref 150–400)
RBC: 4.71 MIL/uL (ref 4.22–5.81)
RDW: 12.8 % (ref 11.5–15.5)
WBC: 8.2 10*3/uL (ref 4.0–10.5)

## 2015-05-29 LAB — BASIC METABOLIC PANEL
Anion gap: 10 (ref 5–15)
BUN: 10 mg/dL (ref 6–20)
CO2: 24 mmol/L (ref 22–32)
Calcium: 8.8 mg/dL — ABNORMAL LOW (ref 8.9–10.3)
Chloride: 107 mmol/L (ref 101–111)
Creatinine, Ser: 0.81 mg/dL (ref 0.61–1.24)
GFR calc Af Amer: >60 mL/min (ref >60)
GFR calc non Af Amer: >60 mL/min (ref >60)
Glucose, Bld: 161 mg/dL — ABNORMAL HIGH (ref 65–99)
Potassium: 3.6 mmol/L (ref 3.5–5.1)
Sodium: 141 mmol/L (ref 135–145)

## 2015-05-29 LAB — GLUCOSE, CAPILLARY
Glucose-Capillary: 184 mg/dL — ABNORMAL HIGH (ref 65–99)
Glucose-Capillary: 192 mg/dL — ABNORMAL HIGH (ref 65–99)

## 2015-05-29 MED ORDER — IPRATROPIUM-ALBUTEROL 0.5-2.5 (3) MG/3ML IN SOLN: 3.0000 mL | RESPIRATORY_TRACT | Status: DC | PRN

## 2015-05-29 NOTE — Discharge Instructions (Signed)
Ammonia Test WHY AM I HAVING THIS TEST? Ammonia testing is used to help diagnose and monitor severe liver diseases. It is also used to diagnose and monitor a brain disorder that can develop in individuals who have liver disease (hepatic encephalopathy).  Ammonia levels can rise when the liver and kidneys are not working well enough to get rid of urea. A buildup of ammonia in the body can cause mental and neurological changes that can lead to confusion, disorientation, sleepiness, and eventually coma and even death. Infants and children with increased ammonia levels may vomit often, be irritable, and be increasingly lethargic. Without treatment they may experience seizures and breathing difficulty and go into a coma and die. WHAT KIND OF SAMPLE IS TAKEN? A blood sample is needed for this test. It is usually collected by inserting a needle into a vein. HOW DO I PREPARE FOR THE TEST? Follow instructions from your health care provider or your child's health care provider about avoiding these before the test:  Exercise.  Smoking cigarettes.  Certain medicines. WHAT ARE THE REFERENCE RANGES? Reference ranges are considered healthy ranges established after testing a large group of healthy people. Reference ranges may vary among different people, labs, and hospitals. It is your responsibility to obtain the test results. Ask the lab or department performing the test when and how you will get the results.  WHAT DO THE RESULTS MEAN? Increased levels of ammonia may mean that you have or your child has:  Liver disease.  Gastrointestinal (GI) bleeding or GI obstruction.  Severe heart failure.  Hemolytic disease of newborn.  Hepatic encephalopathy.  A genetic metabolic disorder. Decreased levels of ammonia may mean that you have or your child has:  High blood pressure (hypertension).  A genetic metabolic syndrome. Talk with your health care provider to discuss the results, treatment options, and  if necessary, the need for more tests. Talk with your health care provider if you have any questions about your results.   This information is not intended to replace advice given to you by your health care provider. Make sure you discuss any questions you have with your health care provider.   Document Released: 03/21/2004 Document Revised: 03/20/2014 Document Reviewed: 07/29/2013 Elsevier Interactive Patient Education [employer].

## 2015-05-29 NOTE — Discharge Summary (Signed)
Physician Discharge Summary  Vincent [name] [id number] DOB: 1965/07/29 DOA: 05/27/2015  PCP: Leonard Downing, MD  Admit date: 05/27/2015 Discharge date: 05/29/2015  Recommendations for Outpatient Follow-up:  1. Pt will need to follow up with PCP in 2-3 weeks post discharge 2. Please discuss with family if ammonia level needs to be repeated, family was concerned with persistently elevated ammonia level and etiology was not determined while pt hospitalized   Discharge Diagnoses:  Principal Problem:   Acute encephalopathy  Discharge Condition: Stable  Diet recommendation: Heart healthy diet discussed in details   History of present illness:  Pt is 50 yo male who is known smoker, also with underlying bipolar disorder, presented to [facility] ED with confusion. Initial ABX with pCO2 49 mmHg, thought to be related to > acute COPD in the setting of narcotic use. Pt has improved in 24 hours and was discharged home.   Hospital Course:   Principal Problem:   Acute encephalopathy - thought to be related to pain meds and COPD - resolved in 24 hours - pt insists on going home   Active Problems:   Acute COPD - no wheezing, continue BD's as needed     Type 2 diabetes mellitus with hyperglycemia ([place]) - continue home medical regimen    Bipolar disorder ([place]) - stable, continue home medical regimen     Obesity  - Body mass index is 32.91 kg/(m^2)  Procedures/Studies: Ct Head Wo Contrast  05/27/2015  No acute intracranial pathology. Inflammatory changes in the paranasal sinuses.   Mr Brain Wo Contrast 05/28/2015  Negative brain MRI with no acute intracranial process identified. 2. Mild inflammatory paranasal sinus disease.   Consultations:  None   Antibiotics:  None   Discharge Exam: Filed Vitals:   05/29/15 0300 05/29/15 0805  BP: 138/79 128/80  Pulse: 70   Temp: 97.7 F (36.5 C) 98.1 F (36.7 C)  Resp: 13 13   Filed Vitals:   05/28/15 2108 05/28/15 2301 05/29/15 0300  05/29/15 0805  BP:  143/85 138/79 128/80  Pulse:  70 70   Temp:  98 F (36.7 C) 97.7 F (36.5 C) 98.1 F (36.7 C)  TempSrc:  Oral Oral Oral  Resp:  17 13 13   Height:      Weight:      SpO2: 99% 99% 97%     General: Pt is alert, follows commands appropriately, not in acute distress Cardiovascular: Regular rate and rhythm, S1/S2 +, no murmurs, no rubs, no gallops Respiratory: Clear to auscultation bilaterally, no wheezing, no crackles, no rhonchi Abdominal: Soft, non tender, non distended, bowel sounds +, no guarding   Discharge Instructions  Discharge Instructions    Diet - low sodium heart healthy    Complete by:  As directed      Increase activity slowly    Complete by:  As directed             Medication List    STOP taking these medications        oxyCODONE-acetaminophen 5-325 MG tablet  Commonly known as:  PERCOCET      TAKE these medications        ALPRAZolam 0.5 MG tablet  Commonly known as:  XANAX  Take 1 tablet (0.5 mg total) by mouth at bedtime as needed for anxiety.     atorvastatin 10 MG tablet  Commonly known as:  LIPITOR  Take 10 mg by mouth daily.     fentaNYL 100 MCG/HR  Commonly known as:  [place] - dosed mcg/hr  Place 100 mcg onto the skin every other day.     fish oil-omega-3 fatty acids 1000 MG capsule  Take 1,000-2,000 mg by mouth 2 (two) times daily. Take 1 capsule in the morning and 2 capsules in the evening     folic acid 1 MG tablet  Commonly known as:  FOLVITE  Take 2 mg by mouth daily.     gabapentin 600 MG tablet  Commonly known as:  NEURONTIN  Take 600 mg by mouth 4 (four) times daily.     Insulin Lispro Prot & Lispro (75-25) 100 UNIT/ML Kwikpen  Commonly known as:  HUMALOG MIX 75/25 KWIKPEN  Inject 160 units at breakfast and 60 units at supper.     ipratropium-albuterol 0.5-2.5 (3) MG/3ML Soln  Commonly known as:  DUONEB  Take 3 mLs by nebulization every 4 (four) hours as needed.     lamoTRIgine 100 MG tablet   Commonly known as:  LAMICTAL  Take 2 tablets (200 mg total) by mouth 2 (two) times daily.     levothyroxine 25 MCG tablet  Commonly known as:  SYNTHROID, LEVOTHROID  Take 25 mcg by mouth daily before breakfast.     lithium carbonate 300 MG CR tablet  Commonly known as:  LITHOBID  Take 450 mg by mouth 2 (two) times daily.     omeprazole 20 MG capsule  Commonly known as:  PRILOSEC  Take 20 mg by mouth daily.     pregabalin 150 MG capsule  Commonly known as:  LYRICA  Take 150 mg by mouth 3 (three) times daily.     RASUVO [place]  Inject into the skin.           Follow-up Information    Follow up with Leonard Downing, MD.   Specialty:  [facility]   Contact information:   [place] [address] [phone]       Call Faye Ramsay, MD.   Specialty:  Internal Medicine   Why:  As needed call my cell phone [phone]   Contact information:   [address] [place] [address] [phone]        The results of significant diagnostics from this hospitalization (including imaging, microbiology, ancillary and laboratory) are listed below for reference.     Microbiology: Recent Results (from the past 240 hour(s))  MRSA PCR Screening     Status: None   Collection Time: 05/28/15 12:08 PM  Result Value Ref Range Status   MRSA by PCR NEGATIVE NEGATIVE Final    Comment:        The GeneXpert MRSA Assay (FDA approved for NASAL specimens only), is one component of a comprehensive MRSA colonization surveillance program. It is not intended to diagnose MRSA infection nor to guide or monitor treatment for MRSA infections.      Labs: Basic Metabolic Panel:  Recent Labs Lab 05/27/15 1815 05/28/15 0439 05/29/15 0533  NA 134* 140 141  K 5.2* 4.4 3.6  CL 95* 99* 107  CO2 24 26 24   GLUCOSE 621* 350* 161*  BUN 17 14 10   CREATININE 1.04 0.95 0.81  CALCIUM 10.0 9.7 8.8*   Liver Function Tests:  Recent  Labs Lab 05/28/15 0439  AST 38  ALT 39  ALKPHOS 81  BILITOT 1.2  PROT 5.8*  ALBUMIN 3.7   No results for input(s): LIPASE, AMYLASE in the last 168 hours.  Recent Labs Lab 05/27/15 2115  AMMONIA 59*  CBC:  Recent Labs Lab 05/27/15 1815 05/28/15 0439 05/29/15 0533  WBC 8.2 9.5 8.2  HGB 16.2 15.2 14.5  HCT 46.2 42.9 39.7  MCV 87.5 85.8 84.3  PLT 249 228 205   Cardiac Enzymes: No results for input(s): CKTOTAL, CKMB, CKMBINDEX, TROPONINI in the last 168 hours. BNP: BNP (last 3 results) No results for input(s): BNP in the last 8760 hours.  ProBNP (last 3 results) No results for input(s): PROBNP in the last 8760 hours.  CBG:  Recent Labs Lab 05/28/15 1053 05/28/15 1120 05/28/15 1613 05/28/15 2300 05/29/15 0803  GLUCAP 358* 354* 343* 150* 184*     SIGNED: Time coordinating discharge: Over 30 minutes  Faye Ramsay, MD  [facility] 05/29/2015, 10:08 AM Pager [phone]  If 7PM-7AM, please contact night-coverage [email or web address] Password [email or web address]

## 2015-05-29 NOTE — Progress Notes (Signed)
Verified with patient his home dose of 70/30 Novolog is 160 units. Verified with MD Doyle Askew that patient is to get 160units of 70/30 this am with CBG of 184. Per MD Doyle Askew hold off sliding scale but give 160u of 70/30.  Will continue to monitor.

## 2015-05-29 NOTE — Progress Notes (Signed)
Patient and wife given discharge packet. Medication regimen and education reviewed and given to patient. Patient and wife state no further questions at this time. Wife says she will make the follow up appointment to see [name] in 2-3 weeks. Will continue to monitor.

## 2015-05-30 NOTE — Progress Notes (Signed)
   Subjective:    Patient ID: Vincent [name], male    DOB: [birth year], 50 y.o.   MRN: [id number]  HPI Pt returns for f/u of diabetes mellitus: DM type: Insulin-requiring type 2 Dx'ed: 99991111 Complications: polyneuropathy and gastroparesis. Therapy: insulin since 2001 DKA: never Severe hypoglycemia: many episodes of severe hypoglycemia (most recent was Oct 2016) Pancreatitis: never Other: due to AMS and severe hypoglycemia, he is not a candidate for a1c < 7; he has chosen a BID insulin schedule. Interval history: no cbg record, but states cbg's vary from 55-300.  It is lowest at hs and in am.  pt states he feels well in general.     Review of Systems     Objective:   Physical Exam        Assessment & Plan:   This encounter was created in error - please disregard.

## 2015-05-31 ENCOUNTER — Encounter: Payer: 59 | Admitting: Endocrinology

## 2015-06-01 ENCOUNTER — Telehealth: Payer: Self-pay | Admitting: Endocrinology

## 2015-06-01 NOTE — Telephone Encounter (Signed)
Patient no showed today's appt. Please advise on how to follow up. °A. No follow up necessary. °B. Follow up urgent. Contact patient immediately. °C. Follow up necessary. Contact patient and schedule visit in ___ days. °D. Follow up advised. Contact patient and schedule visit in ____weeks. ° °

## 2015-06-02 ENCOUNTER — Ambulatory Visit (INDEPENDENT_AMBULATORY_CARE_PROVIDER_SITE_OTHER): Payer: 59 | Admitting: Neurology

## 2015-06-02 ENCOUNTER — Encounter: Payer: Self-pay | Admitting: Neurology

## 2015-06-02 VITALS — BP 152/78 | HR 88 | Resp 20 | Ht 72.0 in | Wt 237.0 lb

## 2015-06-02 DIAGNOSIS — G4701 Insomnia due to medical condition: Secondary | ICD-10-CM

## 2015-06-02 DIAGNOSIS — F3162 Bipolar disorder, current episode mixed, moderate: Secondary | ICD-10-CM | POA: Diagnosis not present

## 2015-06-02 DIAGNOSIS — G8929 Other chronic pain: Secondary | ICD-10-CM | POA: Diagnosis not present

## 2015-06-02 DIAGNOSIS — R0683 Snoring: Secondary | ICD-10-CM

## 2015-06-02 DIAGNOSIS — F5105 Insomnia due to other mental disorder: Secondary | ICD-10-CM | POA: Diagnosis not present

## 2015-06-02 DIAGNOSIS — G4710 Hypersomnia, unspecified: Secondary | ICD-10-CM

## 2015-06-02 DIAGNOSIS — G4730 Sleep apnea, unspecified: Secondary | ICD-10-CM | POA: Diagnosis not present

## 2015-06-02 DIAGNOSIS — F5104 Psychophysiologic insomnia: Secondary | ICD-10-CM | POA: Insufficient documentation

## 2015-06-02 DIAGNOSIS — G40401 Other generalized epilepsy and epileptic syndromes, not intractable, with status epilepticus: Secondary | ICD-10-CM | POA: Diagnosis not present

## 2015-06-02 MED ORDER — ALPRAZOLAM 0.5 MG PO TABS: 0.5000 mg | ORAL_TABLET | Freq: Every evening | ORAL | Status: DC | PRN

## 2015-06-02 NOTE — Telephone Encounter (Signed)
Please come back for a follow-up appointment in 2 months.    

## 2015-06-02 NOTE — Patient Instructions (Signed)
Insomnia Insomnia is a sleep disorder that makes it difficult to fall asleep or to stay asleep. Insomnia can cause tiredness (fatigue), low energy, difficulty concentrating, mood swings, and poor performance at work or school.  There are three different ways to classify insomnia:  Difficulty falling asleep.  Difficulty staying asleep.  Waking up too early in the morning. Any type of insomnia can be long-term (chronic) or short-term (acute). Both are common. Short-term insomnia usually lasts for three months or less. Chronic insomnia occurs at least three times a week for longer than three months. CAUSES  Insomnia may be caused by another condition, situation, or substance, such as:  Anxiety.  Certain medicines.  Gastroesophageal reflux disease (GERD) or other gastrointestinal conditions.  Asthma or other breathing conditions.  Restless legs syndrome, sleep apnea, or other sleep disorders.  Chronic pain.  Menopause. This may include hot flashes.  Stroke.  Abuse of alcohol, tobacco, or illegal drugs.  Depression.  Caffeine.   Neurological disorders, such as Alzheimer disease.  An overactive thyroid (hyperthyroidism). The cause of insomnia may not be known. RISK FACTORS Risk factors for insomnia include:  Gender. Women are more commonly affected than men.  Age. Insomnia is more common as you get older.  Stress. This may involve your professional or personal life.  Income. Insomnia is more common in people with lower income.  Lack of exercise.   Irregular work schedule or night shifts.  Traveling between different time zones. SIGNS AND SYMPTOMS If you have insomnia, trouble falling asleep or trouble staying asleep is the main symptom. This may lead to other symptoms, such as:  Feeling fatigued.  Feeling nervous about going to sleep.  Not feeling rested in the morning.  Having trouble concentrating.  Feeling irritable, anxious, or depressed. TREATMENT   Treatment for insomnia depends on the cause. If your insomnia is caused by an underlying condition, treatment will focus on addressing the condition. Treatment may also include:   Medicines to help you sleep.  Counseling or therapy.  Lifestyle adjustments. HOME CARE INSTRUCTIONS   Take medicines only as directed by your health care provider.  Keep regular sleeping and waking hours. Avoid naps.  Keep a sleep diary to help you and your health care provider figure out what could be causing your insomnia. Include:   When you sleep.  When you wake up during the night.  How well you sleep.   How rested you feel the next day.  Any side effects of medicines you are taking.  What you eat and drink.   Make your bedroom a comfortable place where it is easy to fall asleep:  Put up shades or special blackout curtains to block light from outside.  Use a white noise machine to block noise.  Keep the temperature cool.   Exercise regularly as directed by your health care provider. Avoid exercising right before bedtime.  Use relaxation techniques to manage stress. Ask your health care provider to suggest some techniques that may work well for you. These may include:  Breathing exercises.  Routines to release muscle tension.  Visualizing peaceful scenes.  Cut back on alcohol, caffeinated beverages, and cigarettes, especially close to bedtime. These can disrupt your sleep.  Do not overeat or eat spicy foods right before bedtime. This can lead to digestive discomfort that can make it hard for you to sleep.  Limit screen use before bedtime. This includes:  Watching TV.  Using your smartphone, tablet, and computer.  Stick to a routine. This   can help you fall asleep faster. Try to do a quiet activity, brush your teeth, and go to bed at the same time each night.  Get out of bed if you are still awake after 15 minutes of trying to sleep. Keep the lights down, but try reading or  doing a quiet activity. When you feel sleepy, go back to bed.  Make sure that you drive carefully. Avoid driving if you feel very sleepy.  Keep all follow-up appointments as directed by your health care provider. This is important. SEEK MEDICAL CARE IF:   You are tired throughout the day or have trouble in your daily routine due to sleepiness.  You continue to have sleep problems or your sleep problems get worse. SEEK IMMEDIATE MEDICAL CARE IF:   You have serious thoughts about hurting yourself or someone else.   This information is not intended to replace advice given to you by your health care provider. Make sure you discuss any questions you have with your health care provider.   Document Released: 02/25/2000 Document Revised: 11/18/2014 Document Reviewed: 11/28/2013 [employer] Interactive Patient Education 2016 [employer]  

## 2015-06-02 NOTE — Telephone Encounter (Signed)
Left message for pt to call back and schedulde

## 2015-06-02 NOTE — Telephone Encounter (Signed)
Caitlin, Could you please contact the pt and reschedule. Thanks! 

## 2015-06-02 NOTE — Progress Notes (Signed)
SLEEP MEDICINE CLINIC   Provider:  Larey Seat, M D  Referring Provider: Dr Krista Blue , internal referral  Primary Care Physician:  Leonard Downing, MD  Chief Complaint  Patient presents with  . Follow-up    discuss sleep study results, rm 11, with wife    HPI:  Vincent [name] is a 50 y.o. male , seen here as a referral from [name] and Dr.  Arelia Sneddon for a sleep consultation,  Chief complaint according to patient :  "Benign wake up in the morning I feel I have hardly slept at all"  There have no reports that his sleep is often interrupted. He describes a spontaneous arousal and because he is already awake he may go to the bathroom but it is not nocturia that wakes him. He is usually not woken by headaches. If his blood sugars are extremely high or low he may wake up with a diaphoresis or palpitations. He has had frequent periods of confusion before [name] treated him with lamotrigine, and now having reached 200 mg twice a day the spells have become much shorter and less frequent. His wife describes that he may sleep up to 2 AM Brother deeply and then seems to become very restless for the rest of the night. Throughout the morning he is tossing and turning, her own sleep is impaired by this. He has been having more insomnia problems for the last 6 month.   Sleep habits are as follows: The couple usually retreats to the bedroom around 11 PM, he reports that his mind is racing delaying his sleep onset. He sleeps on 2 pillows, the bedroom is described as cool, quiet and dark. If he is unable to initiate sleep by 2 AM he leaves the bed and goes to another room. The patient describes that he usually can sleep straight for about 2 hours and then becomes more and more restless and to sleep more and more fragmented. He feels awake but not refreshed or restored. He may stay in bed even that he does not sleep. He rises in the morning around 9 AM if his sleep was very poor he may stay in bed until  10 or 11 AM. He will have one cup of coffee in the morning, during the day he will drink between 2 and 4 cans of [employer]. He frequently naps. He is tired and moody . Sleeps in daytime in from of the TV on a recliner.  His wife reports that she has her to snore but not to have stopped breathing or gasping for air. He denies waking up with headaches, and has a very dry mouth in the morning.  Sleep medical history and family sleep history:  insomnia for about 6 month. On chronic narcotics, fentanyl patches.  Vincent [name] is a higher risk of central sleep apnea due to the chronic pain medication he needs to take, he has high risk factors for obstructive sleep apnea as well, being edentulous.   Interval history from 06/02/2015 I'm meeting today with Vincent [name] and his wife [name], after her sleep study from the sixth of laboratory 2017. Vincent [name] has multiple comorbidities including COPD, obesity, GERD, diabetic neuropathy, hypothyroidism, autonomic diabetic gastroparesis and chronic pain as well as a stroke in the past. His sleep study revealed that he was indeed insomniac but had no organic reason identified his apnea index was only 0.9 there was no sleep disordered breathing. He did not have periodic limb movements during sleep.  His oxygen desaturation was trivial was only 0.6 minutes of low oxygen time. Heart rate was regular he spontaneously woke up 8.9 times per hour of sleep and I suspect that this is related to pain. The patient also snores but this did not seem to cause the insomnia and anyway. His Epworth sleepiness score was endorsed at 15 points. She is excessively daytime sleepy during a sleep study he only slept for 54% of the recorded time. The patient's insomnia may also be fostered by Lamictal.     Review of Systems: Out of a complete 14 system review, the patient complains of only the following symptoms, and all other reviewed systems are negative. Snoring, thunderously .  Insomnia, fentanyl user. Caffeine user.   Fell asleep on the highway having seizures and sleep attacks!!  How likely are you to doze in the following situations: 0 = not likely, 1 = slight chance, 2 = moderate chance, 3 = high chance  Sitting and Reading? 2 Watching Television?3 Sitting inactive in a public place (theater or meeting)?3 Lying down in the afternoon when circumstances permit?2 Sitting and talking to someone?3 Sitting quietly after lunch without alcohol?1 In a car, while stopped for a few minutes in traffic?1 As a passenger in a car for an hour without a break?2  Total = 16    Epworth score 16 , Fatigue severity score 55  , depression score  Positive - bipolar , PHQ 9    Social History   Social History  . Marital Status: Married    Spouse Name: N/A  . Number of Children: 3  . Years of Education: GED   Occupational History  . Warden/ranger of company   Social History Main Topics  . Smoking status: Current Every Day Smoker -- 2.00 packs/day for 20 years    Types: Cigarettes  . Smokeless tobacco: Never Used  . Alcohol Use: Yes     Comment: RARE  . Drug Use: No  . Sexual Activity: Not on file   Other Topics Concern  . Not on file   Social History Narrative   Lives at home with his wife and children.   Left-handed.   3-4 cups caffeine per day.    Family History  Problem Relation Age of Onset  . Diabetes Mother   . Diabetes Father   . Hypertension Father   . Heart attack Father   . Heart attack Mother     Past Medical History  Diagnosis Date  . COPD (chronic obstructive pulmonary disease) ([place])   . GERD (gastroesophageal reflux disease)   . Neuropathy in diabetes ([place])     LOWER EXTREMITIES  . Hypothyroidism   . History of TIA (transient ischemic attack) 2008--- NO RESIDUAL  . Diabetes mellitus, type 2 ([place])   . H/O hiatal hernia   . History of kidney stones   . Urgency of urination   . Gastroparesis   .  Hyperlipidemia   . History of gastric ulcer   . Chronic pain syndrome   . Bladder cancer ([place])   . Urinary hesitancy   . At risk for sleep apnea     STOP-BANG=  5       SENT TO PCP 02-24-2014  . Bipolar 1 disorder ([place])   . Arthritis   . Memory loss   . High cholesterol   . Anxiety   . Seizures ([place])     ? sz,"spaces out"  . Carpal tunnel syndrome   .  Stroke [facility])     TIA    Past Surgical History  Procedure Laterality Date  . Rotator cuff repair Right 12/2004  . Carpal tunnel release Right 09-16-2003  . Laparoscopic cholecystectomy  11-17-2010  . Cardiac catheterization  12-27-2001  DR Einar Gip  &  05-26-2009  DR VARANASI    RESULTS FOR BOTH ARE NORMAL CORONARIES AND PERSERVED LVF/ EF 60%  . Transurethral resection of bladder tumor N/A 08/09/2012    Procedure: TRANSURETHRAL RESECTION OF BLADDER TUMOR (TURBT) WITH GYRUS WITH MITOMYCIN C;  Surgeon: Claybon Jabs, MD;  Location: [facility];  Service: Urology;  Laterality: N/A;  . Cystoscopy N/A 10/10/2012    Procedure: CYSTOSCOPY CLOT EVACUATION FULGERATION OF BLEEDERS ;  Surgeon: Claybon Jabs, MD;  Location: [facility];  Service: Urology;  Laterality: N/A;  . Transurethral resection of bladder tumor with gyrus (turbt-gyrus) N/A 02/27/2014    Procedure: TRANSURETHRAL RESECTION OF BLADDER TUMOR WITH GYRUS (TURBT-GYRUS);  Surgeon: Claybon Jabs, MD;  [facility];  Service: Urology;  Laterality: N/A;  . Cholecystectomy    . Carpal tunnel release Left 02/25/2015    Procedure: LEFT CARPAL TUNNEL RELEASE;  Surgeon: Leanora Cover, MD;  Location: [facility];  Service: Orthopedics;  Laterality: Left;    Current Outpatient Prescriptions  Medication Sig Dispense Refill  . ALPRAZolam (XANAX) 0.5 MG tablet Take 1 tablet (0.5 mg total) by mouth at bedtime as needed for anxiety. 30 tablet 1  . atorvastatin (LIPITOR) 10 MG tablet Take 10 mg by mouth daily.    . fentaNYL  (DURAGESIC - DOSED MCG/HR) 100 MCG/HR Place 100 mcg onto the skin every other day.     . fish oil-omega-3 fatty acids 1000 MG capsule Take 1,000-2,000 mg by mouth 2 (two) times daily. Take 1 capsule in the morning and 2 capsules in the evening    . folic acid (FOLVITE) 1 MG tablet Take 2 mg by mouth daily.    Marland Kitchen gabapentin (NEURONTIN) 600 MG tablet Take 600 mg by mouth 4 (four) times daily.    . Insulin Lispro Prot & Lispro (HUMALOG MIX 75/25 KWIKPEN) (75-25) 100 UNIT/ML Kwikpen Inject 160 units at breakfast and 60 units at supper. 20 pen 3  . ipratropium-albuterol (DUONEB) 0.5-2.5 (3) MG/3ML SOLN Take 3 mLs by nebulization every 4 (four) hours as needed. 500 mL 1  . lamoTRIgine (LAMICTAL) 100 MG tablet Take 2 tablets (200 mg total) by mouth 2 (two) times daily. 120 tablet 11  . levothyroxine (SYNTHROID, LEVOTHROID) 25 MCG tablet Take 25 mcg by mouth daily before breakfast.     . lithium carbonate (LITHOBID) 300 MG CR tablet Take 450 mg by mouth 2 (two) times daily.     . Methotrexate, Anti-Rheumatic, (RASUVO [place]) Inject into the skin.    Marland Kitchen omeprazole (PRILOSEC) 20 MG capsule Take 20 mg by mouth daily.    . pregabalin (LYRICA) 150 MG capsule Take 150 mg by mouth 3 (three) times daily.     No current facility-administered medications for this visit.    Allergies as of 06/02/2015 - Review Complete 06/02/2015  Allergen Reaction Noted  . Celebrex [celecoxib] Anaphylaxis and Rash 10/13/2010  . Hydrocodone Rash and Other (See Comments) 02/23/2014  . Sulfa antibiotics Rash 10/13/2010    Vitals: BP 152/78 mmHg  Pulse 88  Resp 20  Ht 6' (1.829 m)  Wt 237 lb (107.502 kg)  BMI 32.14 kg/m2 Last Weight:  Wt Readings from Last 1  Encounters:  06/02/15 237 lb (107.502 kg)   [id number] mass index is 32.14 kg/(m^2).     Last Height:   Ht Readings from Last 1 Encounters:  06/02/15 6' (1.829 m)    Physical exam:  General: The patient is awake, alert and appears not in acute distress. The patient is  well groomed. Head: Normocephalic, atraumatic. Neck is supple. Mallampati 2-3  neck circumference:18. Nasal airflow open , TMJ is  Not  evident . Retrognathia is not seen.  Cardiovascular:  Regular rate and rhythm  without  murmurs or carotid bruit, and without distended neck veins. Respiratory: Lungs are clear to auscultation. Skin:  Without evidence of edema, or rash Trunk: BMI is elevated . The patient's posture is erect  Neurologic exam : The patient is awake and alert, oriented to place and time.   Memory subjective  described as intact.    MOCA:No flowsheet data found. MMSE: MMSE - Mini Mental State Exam 11/04/2014  Orientation to time 3  Orientation to Place 5  Registration 3  Attention/ Calculation 5  Recall 1  Language- name 2 objects 2  Language- repeat 1  Language- follow 3 step command 3  Language- read & follow direction 1  Write a sentence 1  Copy design 1  Total score 26      Attention span & concentration ability appears normal.  Speech is fluent,  without dysarthria, dysphonia or aphasia.  Mood and affect are appropriate.  Cranial nerves: there is a left temples scar , from a scalp penetrating farming accident, likely concussion, with LOC.  Pupils are equal and briskly reactive to light. Funduscopic exam without  evidence of pallor or edema. History of diabetic involvement.  Extraocular movements  in vertical and horizontal planes intact and without nystagmus. Visual fields by finger perimetry are intact. Hearing to finger rub intact.   Facial sensation intact to fine touch.  Facial motor strength is symmetric and tongue and uvula move midline. Shoulder shrug was symmetrical.   Motor exam:  Normal tone, muscle bulk and symmetric strength in all extremities.  Sensory:  Fine touch, pinprick and vibration were tested in all extremities.  Proprioception tested in the upper extremities was normal.  Coordination: Rapid alternating movements in the  fingers/hands was normal. Finger-to-nose maneuver  normal without evidence of ataxia, dysmetria or tremor.  Gait and station: Patient walks without assistive device and is able unassisted to climb up to the exam table. Strength within normal limits.  Stance is stable and normal. Deep tendon reflexes: in the  upper and lower extremities are symmetric and intact. Babinski maneuver response is downgoing.  The patient was advised of the nature of the diagnosed sleep disorder , the treatment options and risks for general a health and wellness arising from not treating the condition.  I spent more than 25 minutes of face to face time with the patient. Greater than 50% of time was spent in counseling and coordination of care. We have discussed the diagnosis and differential and I answered the patient's questions.     Assessment:  After physical and neurologic examination, review of laboratory studies,  Personal review of imaging studies, reports of other /same  Imaging studies ,  Results of polysomnography/ neurophysiology testing and pre-existing records as far as provided in visit., my assessment is   1) Mr Lurlean [name] has insomnia, not apnea. This condition is usually followed by  A psychiatrist .  2) Pain-  In his case, a pain management referral from primary  care was sent and the patient is still waiting.  I think this is still his main problem - pain, pain, pain.     Plan:  Treatment plan and additional workup :    Larey Seat MD  06/02/2015   CC: Leonard Downing, Md [address] [place], [place] [address]

## 2015-06-07 ENCOUNTER — Encounter: Payer: Self-pay | Admitting: *Deleted

## 2015-06-16 ENCOUNTER — Ambulatory Visit: Payer: 59 | Admitting: Nurse Practitioner

## 2015-06-17 ENCOUNTER — Encounter: Payer: Self-pay | Admitting: Nurse Practitioner

## 2015-07-20 ENCOUNTER — Other Ambulatory Visit: Payer: Self-pay | Admitting: Neurology

## 2015-08-02 ENCOUNTER — Telehealth: Payer: Self-pay | Admitting: *Deleted

## 2015-08-02 ENCOUNTER — Other Ambulatory Visit: Payer: Self-pay | Admitting: Neurology

## 2015-08-02 NOTE — Telephone Encounter (Signed)
Rx for alprazolam faxed and confirmed to [facility] on [address] at [phone].

## 2015-08-02 NOTE — Telephone Encounter (Signed)
Dr. Brett Fairy instructed pt that a follow up with Dr. Brett Fairy is not needed. Pt should follow up with [name]. Pt is requesting a refill on xanax from Dr. Brett Fairy.  Is [name] able to fill this or should pt ask his PCP?

## 2015-09-06 ENCOUNTER — Telehealth: Payer: Self-pay | Admitting: Neurology

## 2015-09-06 NOTE — Telephone Encounter (Addendum)
Pt's wife called reg refill. Refusal reason given to her to get meds from PCP or MD. She said ok

## 2015-09-06 NOTE — Telephone Encounter (Signed)
Refill approved on 08/02/15 by [name].

## 2015-11-03 ENCOUNTER — Other Ambulatory Visit: Payer: Self-pay | Admitting: Urology

## 2015-11-23 ENCOUNTER — Encounter (HOSPITAL_BASED_OUTPATIENT_CLINIC_OR_DEPARTMENT_OTHER): Payer: Self-pay | Admitting: *Deleted

## 2015-11-23 NOTE — Progress Notes (Signed)
SPOKE W/ WIFE, PT POOR HISTORIAN (WILL NEED WIFE IN PRE-OP).  NPO AFTER MN.  ARRIVE AT 0600.  NEEDS ISTAT 8 AND EKG.  WILL TAKE AM MEDS W/ SIPS OF WATER.

## 2015-11-25 ENCOUNTER — Encounter (HOSPITAL_BASED_OUTPATIENT_CLINIC_OR_DEPARTMENT_OTHER): Payer: Self-pay | Admitting: Anesthesiology

## 2015-11-25 NOTE — Anesthesia Preprocedure Evaluation (Addendum)
Anesthesia Evaluation  Patient identified by MRN, date of birth, ID band Patient awake    Reviewed: Allergy & Precautions, NPO status , Patient's Chart, lab work & pertinent test results  Airway Mallampati: II  TM Distance: >3 FB Neck ROM: Full    Dental  (+) Missing   Pulmonary neg sleep apnea, COPD,  COPD inhaler, Current Smoker,    Pulmonary exam normal breath sounds clear to auscultation       Cardiovascular negative cardio ROS Normal cardiovascular exam Rhythm:Regular Rate:Normal     Neuro/Psych Seizures -, Well Controlled,  PSYCHIATRIC DISORDERS Anxiety Depression Bipolar Disorder Hx/o TBI Hx/o chronic pain syndrome- on transdermal Fentanyl Diabetic neuropathy  Neuromuscular disease    GI/Hepatic Neg liver ROS, hiatal hernia, GERD  Medicated and Controlled,Diabetic gastroparesis Hx/o gastric ulcer   Endo/Other  diabetes, Poorly Controlled, Type 2, Oral Hypoglycemic Agents, Insulin DependentHypothyroidism Hyperlipidemia Obesity   Renal/GU Renal diseaseHx/o renal calculi   Bladder Ca    Musculoskeletal  (+) Arthritis , Chronic back pain   Abdominal (+) + obese,   Peds  Hematology  (+) anemia ,   Anesthesia Other Findings   Reproductive/Obstetrics                            Lab Results  Component Value Date   WBC 8.2 05/29/2015   HGB 14.5 05/29/2015   HCT 39.7 05/29/2015   MCV 84.3 05/29/2015   PLT 205 05/29/2015     Chemistry      Component Value Date/Time   NA 141 05/29/2015 0533   NA 137 04/01/2015 1006   K 3.6 05/29/2015 0533   CL 107 05/29/2015 0533   CO2 24 05/29/2015 0533   BUN 10 05/29/2015 0533   BUN 14 04/01/2015 1006   CREATININE 0.81 05/29/2015 0533      Component Value Date/Time   CALCIUM 8.8 (L) 05/29/2015 0533   ALKPHOS 81 05/28/2015 0439   AST 38 05/28/2015 0439   ALT 39 05/28/2015 0439   BILITOT 1.2 05/28/2015 0439   BILITOT 0.3 04/01/2015 1006       Anesthesia Physical Anesthesia Plan  ASA: III  Anesthesia Plan: General   Post-op Pain Management:    Induction: Intravenous  Airway Management Planned: LMA  Additional Equipment:   Intra-op Plan:   Post-operative Plan: Extubation in OR  Informed Consent: I have reviewed the patients History and Physical, chart, labs and discussed the procedure including the risks, benefits and alternatives for the proposed anesthesia with the patient or authorized representative who has indicated his/her understanding and acceptance.   Dental advisory given  Plan Discussed with: Anesthesiologist, CRNA and Surgeon  Anesthesia Plan Comments:         Anesthesia Quick Evaluation

## 2015-11-25 NOTE — H&P (Signed)
HPI: Vincent [name] is a 50 year-old male established patient with a hx of bladder cancer.  His bladder cancer was superficial and limitied to the bladder lining.   He did have a TURBT. His last bladder tumor was resected 08/09/2012. He has had the following number of bladder resections: 1. He had treatment with the following intravesical agents: Mitomycin. Patient denies BCG, Interferon, and Adriamycin.   The patient denies any progressive voiding symptoms. He is not having pain in new locations. He has not recently had unwanted weight loss.   His last cysto was 07/22/2015.   No new urologic complaints are noted today. Specifically no hematuria or change in his voiding pattern.     ALLERGIES: CeleBREX CAPS Hydrocodone-Acetaminophen CAPS Sulfa Drugs    MEDICATIONS: Percocet  Atorvastatin Calcium 10 MG Oral Tablet Oral  Duragesic-100 100 MCG/HR Transdermal Patch 72 Hour Transdermal  Folic Acid TABS Oral  Gabapentin 600 MG Oral Tablet Oral  HumaLOG Mix 75/25 KwikPen (75-25) 100 UNIT/ML Subcutaneous Suspension Pen-injector Subcutaneous  Levothyroxine Sodium 25 MCG Oral Tablet Oral  Lyrica 100 MG Oral Capsule Oral  Methotrexate 2.5 MG Oral Tablet Oral  Metoclopramide HCl - 5 MG Oral Tablet Oral  Rasuvo  Tamsulosin HCl - 0.4 MG Oral Capsule 0 Oral  Testosterone Cypionate 200 MG/ML Intramuscular Solution 0 Intramuscular     GU PSH: Bladder Instill AntiCA Agent - 2014 Cystoscopy TURBT <2 cm - 03/07/2014 Cystoscopy TURBT 2-5 cm - 2014      PSH Notes: Cystoscopy With Fulguration Small Lesion (5-30mm), Bladder Injection Of Cancer Treatment, Cystoscopy With Fulguration Medium Lesion (2-5cm), Cholecystectomy, Wrist Arthroscopy With Release Of Transverse Carpal Ligament, Rotator Cuff Repair, Elective Circumcision   NON-GU PSH: Cholecystectomy - 2014 Wrist Endoscopy/surgery - 2014    GU PMH: BPH w/LUTS, Benign prostatic hyperplasia with hesitancy - 08/13/2015 Personal history of malignant  neoplasm of bladder, History of carcinoma of bladder - 07/22/2015 Testicular hypofunction, Hypogonadism, testicular - 04/22/2015 Other microscopic hematuria, Microscopic hematuria - 07/08/2014 Urinary Urgency, Urinary urgency - 2016 Encounter for Prostate Cancer screening, Prostate cancer screening - 2015 Other Disorder Prostate, Acquired asymmetry of prostate - 41 Male ED, unspecified, Erectile dysfunction - 2014      PMH Notes: History of phimosis: He developed phimosis secondary to diabetes in 10/03 and underwent a circumcision in 2/05.   Transitional cell carcinoma of the bladder: He was found to have 7-10 rbc's/hpf. He has a 60-pack-year smoking history. He was found to have a papillary tumor on the left posterior wall of his bladder and underwent TURBT with postoperative mitomycin-C on 08/09/12.  Pathology: Papillary transitional cell carcinoma (Ta,G1)  In 12/15 lesion was noted in the prostatic urethra at the bladder neck and was resected.  Pathology: Cystitis cystica and cystitis glandularis only with no evidence of cancer.   Prostatic asymmetry: He was found on DRE to have some right lobe enlargement relative to the left however there were no worrisome findings and his PSA in 4/14 was 0.47.   LUTS: He noted the acute onset of hesitancy and intermittency with some associated suprapubic tenderness and intermittent bilateral testicular pain without associated dysuria.   Primary hypogonadism: He was experiencing a decreased libido and diminished erectile quality. He was found to have a serum of 167. Further evaluation revealed a normal FSH, LH and prolactin indicating primary hypogonadism.  Previously tried: AndroGel 2 pumps & Testim  Current therapy: Testosterone injections 100 mg q.3 weeks     NON-GU PMH: Anxiety disorder, unspecified, Anxiety (Symptom) -  2014 Gastric ulcer, unspecified as acute or chronic, without hemorrhage or perforation, Gastric Ulcer - 2014 Personal history of  other diseases of the circulatory system, History of hypertension - 2014 Personal history of other diseases of the digestive system, History of esophageal reflux - 2014 Personal history of other endocrine, nutritional and metabolic disease, History of hypothyroidism - 2014, History of diabetes mellitus, - 2014, History of hypercholesterolemia, - 2014 Personal history of other mental and behavioral disorders, History of depression - 2014    FAMILY HISTORY: Acute Myocardial Infarction - Father Death In The Family Father - Runs In Family Diabetes - Runs In [place] _4__ Living Daughter - Runs In Family Family Health Status Of Mother - Alive 48 - Runs In Family nephrolithiasis - Father renal failure - Father   SOCIAL HISTORY: Marital Status: Married Current Smoking Status: Patient smokes. Smokes 2 packs per day.  Does not drink anymore.  Does not use drugs. Drinks 4+ caffeinated drinks per day.     Notes: Current every day smoker, Tobacco use, Alcohol Use, Caffeine Use, Marital History - Currently Married   REVIEW OF SYSTEMS:    GU Review Male:   Patient reports hard to postpone urination, stream starts and stops, have to strain to urinate , and erection problems. Patient denies frequent urination, burning/ pain with urination, get up at night to urinate, leakage of urine, trouble starting your stream, and penile pain.  Gastrointestinal (Upper):   Patient denies nausea, vomiting, and indigestion/ heartburn.  Gastrointestinal (Lower):   Patient denies diarrhea and constipation.  Constitutional:   Patient reports fatigue. Patient denies fever, night sweats, and weight loss.  Skin:   Patient reports skin rash/ lesion. Patient denies itching.  Eyes:   Patient denies blurred vision and double vision.  Ears/ Nose/ Throat:   Patient denies sore throat and sinus problems.  Hematologic/Lymphatic:   Patient denies swollen glands and easy bruising.  Cardiovascular:    Patient reports chest pains. Patient denies leg swelling.  Respiratory:   Patient denies cough and shortness of breath.  Endocrine:   Patient denies excessive thirst.  Musculoskeletal:   Patient reports back pain and joint pain.   Neurological:   Patient denies headaches and dizziness.  Psychologic:   Patient reports depression and anxiety.    VITAL SIGNS:      Weight 245 lb / 111.13 kg  Height 72 in / 182.88 cm  BP 132/74 mmHg  Pulse 80 /min  Temperature 97.3 F / 36 C  BMI 33.2 kg/m   GU PHYSICAL EXAMINATION:    Urethral Meatus: Normal size. No lesion, no wart, no discharge, no polyp. Normal location.  Penis: Circumcised, no warts, no cracks. No dorsal Peyronie's plaques, no left corporal Peyronie's plaques, no right corporal Peyronie's plaques, no scarring, no warts. No balanitis, no meatal stenosis.   MULTI-SYSTEM PHYSICAL EXAMINATION: Physical Exam  Constitutional: Well nourished and well developed . No acute distress.   ENT:. The ears and nose are normal in appearance.   Neck: The appearance of the neck is normal and no neck mass is present.   Pulmonary: No respiratory distress and normal respiratory rhythm and effort.   Cardiovascular: Heart rate and rhythm are normal . No peripheral edema.   Abdomen: The abdomen is soft and nontender. No masses are palpated. No CVA tenderness. No hernias are palpable. No hepatosplenomegaly noted.   Rectal: Rectal exam demonstrates normal sphincter tone, no tenderness and no masses. Estimated prostate size is 1+. His  prostate is asymmetric with the right lobe larger than the left but smooth and nonindurated. The prostate has no nodularity and is not tender. The left seminal vesicle is nonpalpable. The right seminal vesicle is nonpalpable. The perineum is normal on inspection.   Genitourinary: Examination of the penis demonstrates no discharge, no masses, no lesions and a normal meatus. The penis is circumcised. The scrotum is without  lesions. The right epididymis is palpably normal and non-tender. The left epididymis is palpably normal and non-tender. The right testis is non-tender and without masses. The left testis is non-tender and without masses.   Lymphatics: The femoral and inguinal nodes are not enlarged or tender.   Skin: Normal skin turgor, no visible rash and no visible skin lesions.   Neuro/Psych:. Mood and affect are appropriate.       PAST DATA REVIEWED:  Source Of History:  Patient, Outside Source  Records Review:   Previous Patient Records, POC Tool   12/05/13 08/02/13 07/09/12  PSA  Total PSA 0.83  0.53  0.47     12/05/13 08/02/13 02/04/13 01/17/13  Hormones  Testosterone, Total 219  212  232  167     PROCEDURES:         Flexible Cystoscopy done 11/02/15 Risks, benefits, and some of the potential complications of the procedure were discussed at length with the patient including infection, bleeding, voiding discomfort, urinary retention, fever, chills, sepsis, and others. All questions were answered. Informed consent was obtained. Sterile technique and 2% Lidocaine intraurethral analgesia were used.  Meatus:  Normal size. Normal location. Normal condition.  Urethra:  No strictures.  External Sphincter:  Normal.  Verumontanum:  Normal.  Prostate:  Non-obstructing. No hyperplasia.  Bladder Neck:  Non-obstructing.  Ureteral Orifices:  Normal location. Normal size. Normal shape. Effluxed clear urine.  Bladder:  No trabeculation. Normal mucosa. No stones. There is an area just posterior to the left ureteral orifice that looked somewhat suspicious. It is not red and it could be benign.      The lower urinary tract was carefully examined. The procedure was well-tolerated and without complications. Instructions were given to call the office immediately for bloody urine, difficulty urinating, urinary retention, painful or frequent urination, fever or other illness. The patient stated that he understood  these instructions and would comply with them.         Urinalysis - 81003 Dipstick Dipstick Cont'd  Specimen: Voided Bilirubin: Neg  Color: Amber Ketones: 1+  Appearance: Clear Blood: Neg  Specific Gravity: >= 1.030 Protein: Trace  pH: 5.5 Urobilinogen: 1.0  Glucose: 2+ Nitrites: Neg    Leukocyte Esterase: Neg    ASSESSMENT:       There is an area just posterior to his left ureteral orifice that I had noted previously. It doesn't really appear that it has progressed but I think it may be just a little bit too large to try and fulguration in the office without causing the patient discomfort and therefore I recommended doing this as an outpatient under anesthesia. He is in agreement with that.     PLAN:    cystoscopy and biopsy of the area in question with fulguration of the area as well.

## 2015-11-26 ENCOUNTER — Encounter (HOSPITAL_BASED_OUTPATIENT_CLINIC_OR_DEPARTMENT_OTHER)
Admission: RE | Disposition: A | Discharge status: Home / Self Care | Payer: Self-pay | Source: Ambulatory Visit | Attending: Urology

## 2015-11-26 ENCOUNTER — Ambulatory Visit (HOSPITAL_BASED_OUTPATIENT_CLINIC_OR_DEPARTMENT_OTHER)
Admission: RE | Admit: 2015-11-26 | Discharge: 2015-11-26 | Disposition: A | Discharge status: Home / Self Care | Payer: 59 | Source: Ambulatory Visit | Attending: Urology | Admitting: Urology

## 2015-11-26 ENCOUNTER — Ambulatory Visit (HOSPITAL_BASED_OUTPATIENT_CLINIC_OR_DEPARTMENT_OTHER): Payer: 59 | Admitting: Anesthesiology

## 2015-11-26 ENCOUNTER — Encounter (HOSPITAL_BASED_OUTPATIENT_CLINIC_OR_DEPARTMENT_OTHER): Payer: Self-pay | Admitting: *Deleted

## 2015-11-26 DIAGNOSIS — D494 Neoplasm of unspecified behavior of bladder: Secondary | ICD-10-CM

## 2015-11-26 DIAGNOSIS — I10 Essential (primary) hypertension: Secondary | ICD-10-CM | POA: Diagnosis not present

## 2015-11-26 DIAGNOSIS — E291 Testicular hypofunction: Secondary | ICD-10-CM | POA: Insufficient documentation

## 2015-11-26 DIAGNOSIS — Z79891 Long term (current) use of opiate analgesic: Secondary | ICD-10-CM | POA: Diagnosis not present

## 2015-11-26 DIAGNOSIS — Z79899 Other long term (current) drug therapy: Secondary | ICD-10-CM | POA: Diagnosis not present

## 2015-11-26 DIAGNOSIS — E7800 Pure hypercholesterolemia, unspecified: Secondary | ICD-10-CM | POA: Diagnosis not present

## 2015-11-26 DIAGNOSIS — K3184 Gastroparesis: Secondary | ICD-10-CM | POA: Diagnosis not present

## 2015-11-26 DIAGNOSIS — E1165 Type 2 diabetes mellitus with hyperglycemia: Secondary | ICD-10-CM | POA: Insufficient documentation

## 2015-11-26 DIAGNOSIS — F17200 Nicotine dependence, unspecified, uncomplicated: Secondary | ICD-10-CM | POA: Diagnosis not present

## 2015-11-26 DIAGNOSIS — G4730 Sleep apnea, unspecified: Secondary | ICD-10-CM | POA: Insufficient documentation

## 2015-11-26 DIAGNOSIS — K219 Gastro-esophageal reflux disease without esophagitis: Secondary | ICD-10-CM | POA: Insufficient documentation

## 2015-11-26 DIAGNOSIS — N401 Enlarged prostate with lower urinary tract symptoms: Secondary | ICD-10-CM | POA: Insufficient documentation

## 2015-11-26 DIAGNOSIS — E669 Obesity, unspecified: Secondary | ICD-10-CM | POA: Diagnosis not present

## 2015-11-26 DIAGNOSIS — E039 Hypothyroidism, unspecified: Secondary | ICD-10-CM | POA: Diagnosis not present

## 2015-11-26 DIAGNOSIS — E1143 Type 2 diabetes mellitus with diabetic autonomic (poly)neuropathy: Secondary | ICD-10-CM | POA: Insufficient documentation

## 2015-11-26 DIAGNOSIS — M1990 Unspecified osteoarthritis, unspecified site: Secondary | ICD-10-CM | POA: Diagnosis not present

## 2015-11-26 DIAGNOSIS — Z794 Long term (current) use of insulin: Secondary | ICD-10-CM | POA: Insufficient documentation

## 2015-11-26 DIAGNOSIS — J449 Chronic obstructive pulmonary disease, unspecified: Secondary | ICD-10-CM | POA: Insufficient documentation

## 2015-11-26 DIAGNOSIS — R3911 Hesitancy of micturition: Secondary | ICD-10-CM | POA: Diagnosis not present

## 2015-11-26 DIAGNOSIS — Z6830 Body mass index (BMI) 30.0-30.9, adult: Secondary | ICD-10-CM | POA: Diagnosis not present

## 2015-11-26 DIAGNOSIS — C674 Malignant neoplasm of posterior wall of bladder: Secondary | ICD-10-CM | POA: Diagnosis not present

## 2015-11-26 DIAGNOSIS — Z08 Encounter for follow-up examination after completed treatment for malignant neoplasm: Secondary | ICD-10-CM | POA: Diagnosis present

## 2015-11-26 HISTORY — DX: Personal history of other diseases of the nervous system and sense organs: Z86.69

## 2015-11-26 HISTORY — DX: Disorientation, unspecified: R41.0

## 2015-11-26 HISTORY — DX: Insomnia, unspecified: G47.00

## 2015-11-26 HISTORY — PX: CYSTOSCOPY WITH BIOPSY: SHX5122

## 2015-11-26 HISTORY — DX: Personal history of other (healed) physical injury and trauma: Z87.828

## 2015-11-26 HISTORY — DX: Unspecified convulsions: R56.9

## 2015-11-26 HISTORY — DX: Type 2 diabetes mellitus without complications: Z79.4

## 2015-11-26 HISTORY — DX: Diaphragmatic hernia without obstruction or gangrene: K44.9

## 2015-11-26 HISTORY — DX: Type 2 diabetes mellitus without complications: E11.9

## 2015-11-26 HISTORY — DX: Personal history of malignant neoplasm of bladder: Z85.51

## 2015-11-26 LAB — POCT I-STAT, CHEM 8
BUN: 20 mg/dL (ref 6–20)
Calcium, Ion: 1.2 mmol/L (ref 1.15–1.40)
Chloride: 101 mmol/L (ref 101–111)
Creatinine, Ser: 0.7 mg/dL (ref 0.61–1.24)
Glucose, Bld: 153 mg/dL — ABNORMAL HIGH (ref 65–99)
HCT: 45 % (ref 39.0–52.0)
Hemoglobin: 15.3 g/dL (ref 13.0–17.0)
Potassium: 3.4 mmol/L — ABNORMAL LOW (ref 3.5–5.1)
Sodium: 141 mmol/L (ref 135–145)
TCO2: 26 mmol/L (ref 0–100)

## 2015-11-26 LAB — GLUCOSE, CAPILLARY: Glucose-Capillary: 129 mg/dL — ABNORMAL HIGH (ref 65–99)

## 2015-11-26 SURGERY — CYSTOSCOPY, WITH BIOPSY
Anesthesia: General

## 2015-11-26 MED ORDER — LIDOCAINE 2% (20 MG/ML) 5 ML SYRINGE
INTRAMUSCULAR | Status: DC | PRN
  Administered 2015-11-26: 80 mg via INTRAVENOUS

## 2015-11-26 MED ORDER — TRAMADOL HCL 50 MG PO TABS
ORAL_TABLET | ORAL | Status: AC
  Filled 2015-11-26: qty 1

## 2015-11-26 MED ORDER — CIPROFLOXACIN IN D5W 400 MG/200ML IV SOLN
INTRAVENOUS | Status: AC
  Filled 2015-11-26: qty 200

## 2015-11-26 MED ORDER — MIDAZOLAM HCL 5 MG/5ML IJ SOLN
INTRAMUSCULAR | Status: DC | PRN
  Administered 2015-11-26: 2 mg via INTRAVENOUS

## 2015-11-26 MED ORDER — CIPROFLOXACIN IN D5W 400 MG/200ML IV SOLN
400.0000 mg | INTRAVENOUS | Status: AC
  Administered 2015-11-26: 400 mg via INTRAVENOUS
  Filled 2015-11-26: qty 200

## 2015-11-26 MED ORDER — PHENAZOPYRIDINE HCL 200 MG PO TABS: 200.0000 mg | ORAL_TABLET | Freq: Three times a day (TID) | ORAL | 0 refills | Status: DC | PRN

## 2015-11-26 MED ORDER — MEPERIDINE HCL 25 MG/ML IJ SOLN
6.2500 mg | INTRAMUSCULAR | Status: DC | PRN
  Filled 2015-11-26: qty 1

## 2015-11-26 MED ORDER — ONDANSETRON HCL 4 MG/2ML IJ SOLN
4.0000 mg | Freq: Once | INTRAMUSCULAR | Status: DC | PRN
  Filled 2015-11-26: qty 2

## 2015-11-26 MED ORDER — FENTANYL CITRATE (PF) 100 MCG/2ML IJ SOLN
INTRAMUSCULAR | Status: AC
  Filled 2015-11-26: qty 2

## 2015-11-26 MED ORDER — LACTATED RINGERS IV SOLN
INTRAVENOUS | Status: DC
  Administered 2015-11-26: 07:00:00 via INTRAVENOUS
  Filled 2015-11-26: qty 1000

## 2015-11-26 MED ORDER — TRAMADOL HCL 50 MG PO TABS
50.0000 mg | ORAL_TABLET | Freq: Four times a day (QID) | ORAL | Status: DC | PRN
  Administered 2015-11-26: 50 mg via ORAL
  Filled 2015-11-26: qty 1

## 2015-11-26 MED ORDER — PHENAZOPYRIDINE HCL 100 MG PO TABS
ORAL_TABLET | ORAL | Status: AC
  Filled 2015-11-26: qty 2

## 2015-11-26 MED ORDER — TRAMADOL HCL 50 MG PO TABS: 50.0000 mg | ORAL_TABLET | Freq: Four times a day (QID) | ORAL | 0 refills | Status: DC | PRN

## 2015-11-26 MED ORDER — MIDAZOLAM HCL 2 MG/2ML IJ SOLN
INTRAMUSCULAR | Status: AC
  Filled 2015-11-26: qty 2

## 2015-11-26 MED ORDER — ONDANSETRON HCL 4 MG/2ML IJ SOLN
INTRAMUSCULAR | Status: AC
  Filled 2015-11-26: qty 2

## 2015-11-26 MED ORDER — FENTANYL CITRATE (PF) 100 MCG/2ML IJ SOLN
25.0000 ug | INTRAMUSCULAR | Status: DC | PRN
  Filled 2015-11-26: qty 1

## 2015-11-26 MED ORDER — PHENAZOPYRIDINE HCL 200 MG PO TABS
200.0000 mg | ORAL_TABLET | Freq: Once | ORAL | Status: AC
  Administered 2015-11-26: 200 mg via ORAL
  Filled 2015-11-26: qty 1

## 2015-11-26 MED ORDER — ONDANSETRON HCL 4 MG/2ML IJ SOLN
INTRAMUSCULAR | Status: DC | PRN
  Administered 2015-11-26: 4 mg via INTRAVENOUS

## 2015-11-26 MED ORDER — FENTANYL CITRATE (PF) 100 MCG/2ML IJ SOLN
INTRAMUSCULAR | Status: DC | PRN
  Administered 2015-11-26 (×3): 50 ug via INTRAVENOUS

## 2015-11-26 MED ORDER — STERILE WATER FOR IRRIGATION IR SOLN
Status: DC | PRN
  Administered 2015-11-26 (×2): 3000 mL via INTRAVESICAL

## 2015-11-26 MED ORDER — LIDOCAINE 2% (20 MG/ML) 5 ML SYRINGE
INTRAMUSCULAR | Status: AC
  Filled 2015-11-26: qty 5

## 2015-11-26 MED ORDER — PROPOFOL 10 MG/ML IV BOLUS
INTRAVENOUS | Status: DC | PRN
  Administered 2015-11-26: 230 mg via INTRAVENOUS

## 2015-11-26 SURGICAL SUPPLY — 20 items
BAG DRAIN URO-CYSTO SKYTR STRL (DRAIN) ×2 IMPLANT
BAG DRN UROCATH (DRAIN) ×1
CLOTH BEACON ORANGE TIMEOUT ST (SAFETY) ×2 IMPLANT
DRSG TELFA 3X8 NADH (GAUZE/BANDAGES/DRESSINGS) ×2 IMPLANT
ELECT REM PT RETURN 9FT ADLT (ELECTROSURGICAL) ×2
ELECTRODE REM PT RTRN 9FT ADLT (ELECTROSURGICAL) ×1 IMPLANT
GLOVE BIO SURGEON STRL SZ8 (GLOVE) ×2 IMPLANT
GOWN STRL REUS W/ TWL LRG LVL3 (GOWN DISPOSABLE) ×1 IMPLANT
GOWN STRL REUS W/ TWL XL LVL3 (GOWN DISPOSABLE) ×1 IMPLANT
GOWN STRL REUS W/TWL LRG LVL3 (GOWN DISPOSABLE) ×2
GOWN STRL REUS W/TWL XL LVL3 (GOWN DISPOSABLE) ×2
IV NS IRRIG 3000ML ARTHROMATIC (IV SOLUTION) IMPLANT
KIT ROOM TURNOVER WOR (KITS) ×2 IMPLANT
MANIFOLD NEPTUNE II (INSTRUMENTS) ×1 IMPLANT
NEEDLE HYPO 22GX1.5 SAFETY (NEEDLE) IMPLANT
NS IRRIG 500ML POUR BTL (IV SOLUTION) IMPLANT
PACK CYSTO (CUSTOM PROCEDURE TRAY) ×2 IMPLANT
PAD DRESSING TELFA 3X8 NADH (GAUZE/BANDAGES/DRESSINGS) IMPLANT
TUBE CONNECTING 12X1/4 (SUCTIONS) ×1 IMPLANT
WATER STERILE IRR 3000ML UROMA (IV SOLUTION) ×3 IMPLANT

## 2015-11-26 NOTE — Anesthesia Procedure Notes (Signed)
Procedure Name: LMA Insertion Date/Time: 11/26/2015 7:28 AM Performed by: Denna Haggard D Pre-anesthesia Checklist: Patient identified, Emergency Drugs available, Suction available and Patient being monitored Patient Re-evaluated:Patient Re-evaluated prior to inductionOxygen Delivery Method: Circle system utilized Preoxygenation: Pre-oxygenation with 100% oxygen Intubation Type: IV induction Ventilation: Mask ventilation without difficulty LMA: LMA inserted LMA Size: 4.0 Number of attempts: 1 Airway Equipment and Method: Bite block Placement Confirmation: positive ETCO2 Tube secured with: Tape Dental Injury: Teeth and Oropharynx as per pre-operative assessment

## 2015-11-26 NOTE — Anesthesia Postprocedure Evaluation (Signed)
Anesthesia Post Note  Patient: Reace Brehmer Kille  Procedure(s) Performed: Procedure(s) (LRB): CYSTOSCOPY WITH BIOPSY AND FULGURATION (N/A)  Patient location during evaluation: PACU Anesthesia Type: General Level of consciousness: awake and alert and oriented Pain management: pain level controlled Vital Signs Assessment: post-procedure vital signs reviewed and stable Respiratory status: spontaneous breathing, nonlabored ventilation and respiratory function stable Cardiovascular status: blood pressure returned to baseline and stable Postop Assessment: no signs of nausea or vomiting Anesthetic complications: no    Last Vitals:  Vitals:   11/26/15 0815 11/26/15 0820  BP: (!) 146/84   Pulse: 70 66  Resp: 17 20  Temp:      Last Pain:  Vitals:   11/26/15 0807  TempSrc:   PainSc: Asleep                 Lorana Maffeo A.

## 2015-11-26 NOTE — Transfer of Care (Signed)
Immediate Anesthesia Transfer of Care Note  Patient: Vincent [name]  Procedure(s) Performed: Procedure(s) (LRB): CYSTOSCOPY WITH BIOPSY AND FULGURATION (N/A)  Patient Location: PACU  Anesthesia Type: General  Level of Consciousness: awake, oriented, sedated and patient cooperative  Airway & Oxygen Therapy: Patient Spontanous Breathing and Patient connected to face mask oxygen  Post-op Assessment: Report given to PACU RN and Post -op Vital signs reviewed and stable  Post vital signs: Reviewed and stable  Complications: No apparent anesthesia complications

## 2015-11-26 NOTE — Op Note (Signed)
PATIENT:  Vincent [name]  PRE-OPERATIVE DIAGNOSIS: Recurrent transitional cell carcinoma of the bladder  POST-OPERATIVE DIAGNOSIS: Same  PROCEDURE: Cystoscopy with bladder biopsy and fulguration of bladder tumors (2 cm, left floor and posterior wall)  SURGEON:  Claybon Jabs  INDICATION: Vincent [name] is a 50 year old male with a history of transitional cell carcinoma of the bladder initially resected in 5/14 and treated postoperatively with mitomycin-C intravesically. His pathology revealed Ta,G1 transitional cell carcinoma. He's been undergoing routine surveillance cystoscopy and was recently found to have an area posterior to the left ureteral orifice that appeared concerning for recurrence and therefore he is brought to the operating room for management of this finding.  ANESTHESIA:  General  EBL:  Minimal  DRAINS: None  LOCAL MEDICATIONS USED:  None  SPECIMEN:  Cold cup biopsies from the posterior wall and left floor of the bladder.  Description of procedure: After informed consent the patient was taken to the operating room and placed on the table in a supine position. General anesthesia was then administered. Once fully anesthetized the patient was moved to the dorsal lithotomy position and the genitalia were sterilely prepped and draped in standard fashion. An official timeout was then performed.  The 23 French cystoscope was passed under direct vision down the urethra which is noted be normal. The prostatic urethra was unremarkable and the bladder was entered and fully inspected. There was 1+ trabeculation noted. I noted both ureteral orifices were of normal configuration and position. Just posterior to the left orifice but not involving the orifice was an area of papillary mucosa and I also noted an area on the direct posterior wall in the midline.  The cold cup biopsy forceps were introduced and 2 biopsies were taken from each of the locations. I then used the Bugbee  electrode to fulgurate all of the abnormal appearing mucosa and cauterize the biopsy sites. There was no bleeding at the end of the procedure and the bladder was therefore drained and the cystoscope removed. The patient tolerated procedure well no intraoperative complications.  PLAN OF CARE: Discharge to home after PACU  PATIENT DISPOSITION:  PACU - hemodynamically stable.

## 2015-11-26 NOTE — Discharge Instructions (Signed)
Cystoscopy patient instructions ° °Following a cystoscopy, a catheter (a flexible rubber tube) is sometimes left in place to empty the bladder. This may cause some discomfort or a feeling that you need to urinate. Your doctor determines the period of time that the catheter will be left in place. °You may have bloody urine for two to three days (Call your doctor if the amount of bleeding increases or does not subside). ° °You may pass blood clots in your urine, especially if you had a biopsy. It is not unusual to pass small blood clots and have some bloody urine a couple of weeks after your cystoscopy. Again, call your doctor if the bleeding does not subside. °You may have: °Dysuria (painful urination) °Frequency (urinating often) °Urgency (strong desire to urinate) ° °These symptoms are common especially if medicine is instilled into the bladder or a ureteral stent is placed. Avoiding alcohol and caffeine, such as coffee, tea, and chocolate, may help relieve these symptoms. Drink plenty of water, unless otherwise instructed. Your doctor may also prescribe an antibiotic or other medicine to reduce these symptoms. ° °Cystoscopy results are available soon after the procedure; biopsy results usually take two to four days. Your doctor will discuss the results of your exam with you. Before you go home, you will be given specific instructions for follow-up care. °Special Instructions: ° °1  If you are going home with a catheter in place do not take a tub bath until removed by your doctor.  °2  You may resume your normal activities.  °3  Do not drive or operate machinery if you are taking narcotic pain medicine.  °4  Be sure to keep all follow-up appointments with your doctor.  ° °5 Call Your Doctor If: The catheter is not draining ° You have severe pain ° You are unable to urinate ° You have a fever over 101 ° You have severe bleeding ° ° °Post Anesthesia Home Care Instructions ° °Activity: °Get plenty of rest for the  remainder of the day. A responsible adult should stay with you for 24 hours following the procedure.  °For the next 24 hours, DO NOT: °-Drive a car °-Operate machinery °-Drink alcoholic beverages °-Take any medication unless instructed by your physician °-Make any legal decisions or sign important papers. ° °Meals: °Start with liquid foods such as gelatin or soup. Progress to regular foods as tolerated. Avoid greasy, spicy, heavy foods. If nausea and/or vomiting occur, drink only clear liquids until the nausea and/or vomiting subsides. Call your physician if vomiting continues. ° °Special Instructions/Symptoms: °Your throat may feel dry or sore from the anesthesia or the breathing tube placed in your throat during surgery. If this causes discomfort, gargle with warm salt water. The discomfort should disappear within 24 hours. ° °If you had a scopolamine patch placed behind your ear for the management of post- operative nausea and/or vomiting: ° °1. The medication in the patch is effective for 72 hours, after which it should be removed.  Wrap patch in a tissue and discard in the trash. Wash hands thoroughly with soap and water. °2. You may remove the patch earlier than 72 hours if you experience unpleasant side effects which may include dry mouth, dizziness or visual disturbances. °3. Avoid touching the patch. Wash your hands with soap and water after contact with the patch. °  ° °        ° °

## 2015-11-29 ENCOUNTER — Encounter (HOSPITAL_BASED_OUTPATIENT_CLINIC_OR_DEPARTMENT_OTHER): Payer: Self-pay | Admitting: Urology

## 2016-02-09 ENCOUNTER — Other Ambulatory Visit: Payer: Self-pay | Admitting: Endocrinology

## 2016-02-09 NOTE — Telephone Encounter (Signed)
Please refill x 3 mos Ov is due 

## 2016-02-15 ENCOUNTER — Other Ambulatory Visit: Payer: Self-pay | Admitting: Urology

## 2016-02-16 ENCOUNTER — Other Ambulatory Visit: Payer: Self-pay | Admitting: Urology

## 2016-02-17 ENCOUNTER — Encounter (HOSPITAL_BASED_OUTPATIENT_CLINIC_OR_DEPARTMENT_OTHER): Payer: Self-pay | Admitting: *Deleted

## 2016-02-17 NOTE — Progress Notes (Signed)
SPOKE W/ WIFE, PT POOR HISTORIAN (WILL NEED WIFE IN PRE-OP).  NPO AFTER MN.  ARRIVE AT 0715.  NEEDS ISTAT 8 AND EKG.  WILL TAKE AM MEDS DOS W/ SIPS OF WATER, NO INSULIN.

## 2016-02-21 ENCOUNTER — Ambulatory Visit (HOSPITAL_BASED_OUTPATIENT_CLINIC_OR_DEPARTMENT_OTHER)
Admission: RE | Admit: 2016-02-21 | Discharge: 2016-02-21 | Disposition: A | Discharge status: Home / Self Care | Payer: 59 | Source: Ambulatory Visit | Attending: Urology | Admitting: Urology

## 2016-02-21 ENCOUNTER — Encounter (HOSPITAL_BASED_OUTPATIENT_CLINIC_OR_DEPARTMENT_OTHER)
Admission: RE | Disposition: A | Discharge status: Home / Self Care | Payer: Self-pay | Source: Ambulatory Visit | Attending: Urology

## 2016-02-21 ENCOUNTER — Encounter (HOSPITAL_BASED_OUTPATIENT_CLINIC_OR_DEPARTMENT_OTHER): Payer: Self-pay | Admitting: Anesthesiology

## 2016-02-21 ENCOUNTER — Ambulatory Visit (HOSPITAL_BASED_OUTPATIENT_CLINIC_OR_DEPARTMENT_OTHER): Payer: 59 | Admitting: Anesthesiology

## 2016-02-21 DIAGNOSIS — F319 Bipolar disorder, unspecified: Secondary | ICD-10-CM | POA: Diagnosis not present

## 2016-02-21 DIAGNOSIS — E1165 Type 2 diabetes mellitus with hyperglycemia: Secondary | ICD-10-CM | POA: Insufficient documentation

## 2016-02-21 DIAGNOSIS — G4730 Sleep apnea, unspecified: Secondary | ICD-10-CM | POA: Insufficient documentation

## 2016-02-21 DIAGNOSIS — D649 Anemia, unspecified: Secondary | ICD-10-CM | POA: Insufficient documentation

## 2016-02-21 DIAGNOSIS — F419 Anxiety disorder, unspecified: Secondary | ICD-10-CM | POA: Diagnosis not present

## 2016-02-21 DIAGNOSIS — N433 Hydrocele, unspecified: Secondary | ICD-10-CM | POA: Insufficient documentation

## 2016-02-21 DIAGNOSIS — Z882 Allergy status to sulfonamides status: Secondary | ICD-10-CM | POA: Diagnosis not present

## 2016-02-21 DIAGNOSIS — F17210 Nicotine dependence, cigarettes, uncomplicated: Secondary | ICD-10-CM | POA: Insufficient documentation

## 2016-02-21 DIAGNOSIS — K219 Gastro-esophageal reflux disease without esophagitis: Secondary | ICD-10-CM | POA: Diagnosis not present

## 2016-02-21 DIAGNOSIS — N451 Epididymitis: Secondary | ICD-10-CM | POA: Diagnosis not present

## 2016-02-21 DIAGNOSIS — Z885 Allergy status to narcotic agent status: Secondary | ICD-10-CM | POA: Diagnosis not present

## 2016-02-21 DIAGNOSIS — E039 Hypothyroidism, unspecified: Secondary | ICD-10-CM | POA: Insufficient documentation

## 2016-02-21 DIAGNOSIS — N50811 Right testicular pain: Secondary | ICD-10-CM | POA: Diagnosis not present

## 2016-02-21 DIAGNOSIS — Z8551 Personal history of malignant neoplasm of bladder: Secondary | ICD-10-CM | POA: Diagnosis not present

## 2016-02-21 DIAGNOSIS — N401 Enlarged prostate with lower urinary tract symptoms: Secondary | ICD-10-CM | POA: Insufficient documentation

## 2016-02-21 DIAGNOSIS — R3911 Hesitancy of micturition: Secondary | ICD-10-CM | POA: Diagnosis not present

## 2016-02-21 DIAGNOSIS — M1990 Unspecified osteoarthritis, unspecified site: Secondary | ICD-10-CM | POA: Diagnosis not present

## 2016-02-21 DIAGNOSIS — J449 Chronic obstructive pulmonary disease, unspecified: Secondary | ICD-10-CM | POA: Insufficient documentation

## 2016-02-21 DIAGNOSIS — Z794 Long term (current) use of insulin: Secondary | ICD-10-CM | POA: Diagnosis not present

## 2016-02-21 DIAGNOSIS — Z888 Allergy status to other drugs, medicaments and biological substances status: Secondary | ICD-10-CM | POA: Insufficient documentation

## 2016-02-21 DIAGNOSIS — K449 Diaphragmatic hernia without obstruction or gangrene: Secondary | ICD-10-CM | POA: Diagnosis not present

## 2016-02-21 HISTORY — PX: ORCHIECTOMY: SHX2116

## 2016-02-21 LAB — POCT I-STAT, CHEM 8
BUN: 12 mg/dL (ref 6–20)
Calcium, Ion: 1.23 mmol/L (ref 1.15–1.40)
Chloride: 105 mmol/L (ref 101–111)
Creatinine, Ser: 0.8 mg/dL (ref 0.61–1.24)
Glucose, Bld: 117 mg/dL — ABNORMAL HIGH (ref 65–99)
HCT: 41 % (ref 39.0–52.0)
Hemoglobin: 13.9 g/dL (ref 13.0–17.0)
Potassium: 3.7 mmol/L (ref 3.5–5.1)
Sodium: 143 mmol/L (ref 135–145)
TCO2: 26 mmol/L (ref 0–100)

## 2016-02-21 LAB — GLUCOSE, CAPILLARY: Glucose-Capillary: 121 mg/dL — ABNORMAL HIGH (ref 65–99)

## 2016-02-21 SURGERY — ORCHIECTOMY
Anesthesia: General | Site: Scrotum | Laterality: Right

## 2016-02-21 MED ORDER — FENTANYL CITRATE (PF) 100 MCG/2ML IJ SOLN
INTRAMUSCULAR | Status: DC | PRN
  Administered 2016-02-21 (×2): 50 ug via INTRAVENOUS

## 2016-02-21 MED ORDER — PROPOFOL 10 MG/ML IV BOLUS
INTRAVENOUS | Status: DC | PRN
  Administered 2016-02-21: 200 mg via INTRAVENOUS

## 2016-02-21 MED ORDER — LACTATED RINGERS IV SOLN
INTRAVENOUS | Status: DC
  Administered 2016-02-21 (×2): via INTRAVENOUS
  Filled 2016-02-21: qty 1000

## 2016-02-21 MED ORDER — OXYCODONE HCL 5 MG PO TABS
5.0000 mg | ORAL_TABLET | Freq: Once | ORAL | Status: DC | PRN
  Filled 2016-02-21: qty 1

## 2016-02-21 MED ORDER — ONDANSETRON HCL 4 MG/2ML IJ SOLN
INTRAMUSCULAR | Status: AC
  Filled 2016-02-21: qty 2

## 2016-02-21 MED ORDER — AMOXICILLIN-POT CLAVULANATE 500-125 MG PO TABS: 1.0000 | ORAL_TABLET | Freq: Two times a day (BID) | ORAL | 0 refills | Status: DC

## 2016-02-21 MED ORDER — LIDOCAINE 2% (20 MG/ML) 5 ML SYRINGE
INTRAMUSCULAR | Status: DC | PRN
  Administered 2016-02-21: 100 mg via INTRAVENOUS

## 2016-02-21 MED ORDER — PHENYLEPHRINE 40 MCG/ML (10ML) SYRINGE FOR IV PUSH (FOR BLOOD PRESSURE SUPPORT)
PREFILLED_SYRINGE | INTRAVENOUS | Status: AC
  Filled 2016-02-21: qty 10

## 2016-02-21 MED ORDER — DEXAMETHASONE SODIUM PHOSPHATE 10 MG/ML IJ SOLN
INTRAMUSCULAR | Status: AC
  Filled 2016-02-21: qty 1

## 2016-02-21 MED ORDER — CEFAZOLIN SODIUM-DEXTROSE 2-4 GM/100ML-% IV SOLN
INTRAVENOUS | Status: AC
  Filled 2016-02-21: qty 100

## 2016-02-21 MED ORDER — PROMETHAZINE HCL 25 MG/ML IJ SOLN
6.2500 mg | INTRAMUSCULAR | Status: DC | PRN
  Filled 2016-02-21: qty 1

## 2016-02-21 MED ORDER — OXYCODONE HCL 5 MG/5ML PO SOLN
5.0000 mg | Freq: Once | ORAL | Status: DC | PRN
  Filled 2016-02-21: qty 5

## 2016-02-21 MED ORDER — PROPOFOL 10 MG/ML IV BOLUS
INTRAVENOUS | Status: AC
  Filled 2016-02-21: qty 40

## 2016-02-21 MED ORDER — PHENYLEPHRINE HCL 10 MG/ML IJ SOLN
INTRAMUSCULAR | Status: DC | PRN
  Administered 2016-02-21 (×2): 120 ug via INTRAVENOUS

## 2016-02-21 MED ORDER — BUPIVACAINE-EPINEPHRINE (PF) 0.5% -1:200000 IJ SOLN
INTRAMUSCULAR | Status: DC | PRN
  Administered 2016-02-21: 10 mL

## 2016-02-21 MED ORDER — MIDAZOLAM HCL 5 MG/5ML IJ SOLN
INTRAMUSCULAR | Status: DC | PRN
  Administered 2016-02-21: 2 mg via INTRAVENOUS

## 2016-02-21 MED ORDER — FENTANYL CITRATE (PF) 100 MCG/2ML IJ SOLN
INTRAMUSCULAR | Status: AC
  Filled 2016-02-21: qty 2

## 2016-02-21 MED ORDER — HYDROMORPHONE HCL 1 MG/ML IJ SOLN
0.2500 mg | INTRAMUSCULAR | Status: DC | PRN
  Filled 2016-02-21: qty 0.5

## 2016-02-21 MED ORDER — MIDAZOLAM HCL 2 MG/2ML IJ SOLN
INTRAMUSCULAR | Status: AC
  Filled 2016-02-21: qty 2

## 2016-02-21 MED ORDER — MEPERIDINE HCL 25 MG/ML IJ SOLN
6.2500 mg | INTRAMUSCULAR | Status: DC | PRN
  Filled 2016-02-21: qty 1

## 2016-02-21 MED ORDER — ONDANSETRON HCL 4 MG/2ML IJ SOLN
INTRAMUSCULAR | Status: DC | PRN
  Administered 2016-02-21: 4 mg via INTRAVENOUS

## 2016-02-21 MED ORDER — ARTIFICIAL TEARS OP OINT
TOPICAL_OINTMENT | OPHTHALMIC | Status: AC
  Filled 2016-02-21: qty 3.5

## 2016-02-21 MED ORDER — CEFAZOLIN SODIUM-DEXTROSE 2-4 GM/100ML-% IV SOLN
2.0000 g | INTRAVENOUS | Status: AC
  Administered 2016-02-21: 2 g via INTRAVENOUS
  Filled 2016-02-21: qty 100

## 2016-02-21 MED ORDER — LIDOCAINE 2% (20 MG/ML) 5 ML SYRINGE
INTRAMUSCULAR | Status: AC
  Filled 2016-02-21: qty 5

## 2016-02-21 MED ORDER — OXYCODONE HCL 10 MG PO TABS: 10.0000 mg | ORAL_TABLET | ORAL | 0 refills | Status: DC | PRN

## 2016-02-21 SURGICAL SUPPLY — 56 items
ADH SKN CLS APL DERMABOND .7 (GAUZE/BANDAGES/DRESSINGS) ×1
APL SKNCLS STERI-STRIP NONHPOA (GAUZE/BANDAGES/DRESSINGS)
APPLICATOR COTTON TIP 6IN STRL (MISCELLANEOUS) IMPLANT
BENZOIN TINCTURE PRP APPL 2/3 (GAUZE/BANDAGES/DRESSINGS) IMPLANT
BLADE CLIPPER SURG (BLADE) ×2 IMPLANT
BLADE SURG 15 STRL LF DISP TIS (BLADE) ×1 IMPLANT
BLADE SURG 15 STRL SS (BLADE) ×2
BNDG GAUZE ELAST 4 BULKY (GAUZE/BANDAGES/DRESSINGS) ×2 IMPLANT
COVER BACK TABLE 60X90IN (DRAPES) ×2 IMPLANT
COVER MAYO STAND STRL (DRAPES) ×2 IMPLANT
DERMABOND ADVANCED (GAUZE/BANDAGES/DRESSINGS) ×1
DERMABOND ADVANCED .7 DNX12 (GAUZE/BANDAGES/DRESSINGS) IMPLANT
DRAIN PENROSE 18X1/2 LTX STRL (DRAIN) IMPLANT
DRAIN PENROSE 18X1/4 LTX STRL (WOUND CARE) ×2 IMPLANT
DRAPE EXTREMITY T 121X128X90 (DRAPE) ×1 IMPLANT
DRAPE LAPAROTOMY 100X72 PEDS (DRAPES) ×2 IMPLANT
DRSG TEGADERM 4X4.75 (GAUZE/BANDAGES/DRESSINGS) IMPLANT
ELECT REM PT RETURN 9FT ADLT (ELECTROSURGICAL) ×2
ELECTRODE REM PT RTRN 9FT ADLT (ELECTROSURGICAL) ×1 IMPLANT
GAUZE SPONGE 4X4 12PLY STRL (GAUZE/BANDAGES/DRESSINGS) ×1 IMPLANT
GLOVE BIO SURGEON STRL SZ8 (GLOVE) ×2 IMPLANT
GOWN STRL REUS W/ TWL LRG LVL3 (GOWN DISPOSABLE) ×1 IMPLANT
GOWN STRL REUS W/ TWL XL LVL3 (GOWN DISPOSABLE) ×1 IMPLANT
GOWN STRL REUS W/TWL LRG LVL3 (GOWN DISPOSABLE) ×2
GOWN STRL REUS W/TWL XL LVL3 (GOWN DISPOSABLE) ×2
IV NS IRRIG 3000ML ARTHROMATIC (IV SOLUTION) IMPLANT
KIT ROOM TURNOVER WOR (KITS) ×2 IMPLANT
MANIFOLD NEPTUNE II (INSTRUMENTS) IMPLANT
NEEDLE HYPO 22GX1.5 SAFETY (NEEDLE) ×1 IMPLANT
NS IRRIG 500ML POUR BTL (IV SOLUTION) ×1 IMPLANT
PACK BASIN DAY SURGERY FS (CUSTOM PROCEDURE TRAY) ×2 IMPLANT
PENCIL BUTTON HOLSTER BLD 10FT (ELECTRODE) ×2 IMPLANT
PROSTHESIS TESTICULAR NAC LRG (Urological Implant) IMPLANT
SPONGE INTESTINAL PEANUT (DISPOSABLE) IMPLANT
SPONGE LAP 4X18 X RAY DECT (DISPOSABLE) ×2 IMPLANT
STRIP CLOSURE SKIN 1/2X4 (GAUZE/BANDAGES/DRESSINGS) IMPLANT
STRIP CLOSURE SKIN 1/4X4 (GAUZE/BANDAGES/DRESSINGS) IMPLANT
SUPPORT SCROTAL LG STRP (MISCELLANEOUS) IMPLANT
SUT CHROMIC 3 0 SH 27 (SUTURE) IMPLANT
SUT MNCRL AB 4-0 PS2 18 (SUTURE) ×2 IMPLANT
SUT SILK 2 0 SH (SUTURE) IMPLANT
SUT SILK 2 0 TIES 17X18 (SUTURE)
SUT SILK 2-0 18XBRD TIE BLK (SUTURE) IMPLANT
SUT SILK 3 0 SH 30 (SUTURE) IMPLANT
SUT VIC AB 0 SH 27 (SUTURE) IMPLANT
SUT VIC AB 2-0 CT2 27 (SUTURE) ×2 IMPLANT
SUT VIC AB 3-0 SH 27 (SUTURE) ×4
SUT VIC AB 3-0 SH 27X BRD (SUTURE) ×2 IMPLANT
SYR CONTROL 10ML LL (SYRINGE) IMPLANT
TESTICULAR PROSTHESIS NAC LRG (Urological Implant) ×2 IMPLANT
TOWEL OR 17X24 6PK STRL BLUE (TOWEL DISPOSABLE) ×4 IMPLANT
TRAY DSU PREP LF (CUSTOM PROCEDURE TRAY) ×2 IMPLANT
TUBE CONNECTING 12X1/4 (SUCTIONS) ×2 IMPLANT
WATER STERILE IRR 3000ML UROMA (IV SOLUTION) IMPLANT
WATER STERILE IRR 500ML POUR (IV SOLUTION) ×2 IMPLANT
YANKAUER SUCT BULB TIP NO VENT (SUCTIONS) ×2 IMPLANT

## 2016-02-21 NOTE — Anesthesia Procedure Notes (Signed)
Procedure Name: LMA Insertion Date/Time: 02/21/2016 8:45 AM Performed by: Wanita Chamberlain Pre-anesthesia Checklist: Patient identified, Timeout performed, Emergency Drugs available, Suction available and Patient being monitored Patient Re-evaluated:Patient Re-evaluated prior to inductionOxygen Delivery Method: Circle system utilized Preoxygenation: Pre-oxygenation with 100% oxygen Intubation Type: IV induction Ventilation: Mask ventilation without difficulty LMA: LMA inserted LMA Size: 5.0 Number of attempts: 1 Placement Confirmation: breath sounds checked- equal and bilateral,  CO2 detector and positive ETCO2 Tube secured with: Tape Dental Injury: Teeth and Oropharynx as per pre-operative assessment

## 2016-02-21 NOTE — Discharge Instructions (Signed)
Scrotal surgery postoperative instructions ° °Wound: ° °In most cases your incision will have absorbable sutures that will dissolve within the first 10-20 days. Some will fall out even earlier. Expect some redness as the sutures dissolved but this should occur only around the sutures. If there is generalized redness, especially with increasing pain or swelling, let us know. The scrotum will very likely get "black and blue" as the blood in the tissues spread. Sometimes the whole scrotum will turn colors. The black and blue is followed by a yellow and brown color. In time, all the discoloration will go away. In some cases some firm swelling in the area of the testicle may persist for up to 4-6 weeks after the surgery and is considered normal in most cases. ° °Diet: ° °You may return to your normal diet within 24 hours following your surgery. You may note some mild nausea and possibly vomiting the first 6-8 hours following surgery. This is usually due to the side effects of anesthesia, and will disappear quite soon. I would suggest clear liquids and a very light meal the first evening following your surgery. ° °Activity: ° °Your physical activity should be restricted the first 48 hours. During that time you should remain relatively inactive, moving about only when necessary. During the first 7-10 days following surgery he should avoid lifting any heavy objects (anything greater than 15 pounds), and avoid strenuous exercise. If you work, ask us specifically about your restrictions, both for work and home. We will write a note to your employer if needed. ° °You should plan to wear a tight pair of jockey shorts or an athletic supporter for the first 4-5 days, even to sleep. This will keep the scrotum immobilized to some degree and keep the swelling down. ° °Ice packs should be placed on and off over the scrotum for the first 48 hours. Frozen peas or corn in a ZipLock bag can be frozen, used and re-frozen. Fifteen minutes  on and 15 minutes off is a reasonable schedule. The ice is a good pain reliever and keeps the swelling down. ° °Hygiene: ° °You may shower 48 hours after your surgery. Tub bathing should be restricted until the seventh day. ° ° ° ° ° ° ° ° ° °Medication: ° °You will be sent home with some type of pain medication. In many cases you will be sent home with a narcotic pain pill (hydrococone or oxycodone). If the pain is not too bad, you may take either Tylenol (acetaminophen) or Advil (ibuprofen) which contain no narcotic agents, and might be tolerated a little better, with fewer side effects. If the pain medication you are sent home with does not control the pain, you will have to let us know. Some narcotic pain medications cannot be given or refilled by a phone call to a pharmacy. ° °Problems you should report to us: ° °· Fever of 101.0 degrees Fahrenheit or greater. °· Moderate or severe swelling under the skin incision or involving the scrotum. °· Drug reaction such as hives, a rash, nausea or vomiting. °·  ° ° °Post Anesthesia Home Care Instructions ° °Activity: °Get plenty of rest for the remainder of the day. A responsible adult should stay with you for 24 hours following the procedure.  °For the next 24 hours, DO NOT: °-Drive a car °-Operate machinery °-Drink alcoholic beverages °-Take any medication unless instructed by your physician °-Make any legal decisions or sign important papers. ° °Meals: °Start with liquid foods such as   gelatin or soup. Progress to regular foods as tolerated. Avoid greasy, spicy, heavy foods. If nausea and/or vomiting occur, drink only clear liquids until the nausea and/or vomiting subsides. Call your physician if vomiting continues. ° °Special Instructions/Symptoms: °Your throat may feel dry or sore from the anesthesia or the breathing tube placed in your throat during surgery. If this causes discomfort, gargle with warm salt water. The discomfort should disappear within 24  hours. ° °If you had a scopolamine patch placed behind your ear for the management of post- operative nausea and/or vomiting: ° °1. The medication in the patch is effective for 72 hours, after which it should be removed.  Wrap patch in a tissue and discard in the trash. Wash hands thoroughly with soap and water. °2. You may remove the patch earlier than 72 hours if you experience unpleasant side effects which may include dry mouth, dizziness or visual disturbances. °3. Avoid touching the patch. Wash your hands with soap and water after contact with the patch. °  ° °

## 2016-02-21 NOTE — Transfer of Care (Signed)
Immediate Anesthesia Transfer of Care Note  Patient: Vincent [name]  Procedure(s) Performed: Procedure(s): SCROTAL ORCHIECTOMY with TESTICULAR PROSTHESIS IMPLANT (Right)  Patient Location: PACU  Anesthesia Type:General  Level of Consciousness: awake, alert , oriented and patient cooperative  Airway & Oxygen Therapy: Patient Spontanous Breathing and Patient connected to nasal cannula oxygen  Post-op Assessment: Report given to RN and Post -op Vital signs reviewed and stable  Post vital signs: Reviewed and stable  Last Vitals:  Vitals:   02/21/16 0716  BP: (!) 153/73  Pulse: 79  Resp: 16  Temp: 36.4 C    Last Pain:  Vitals:   02/21/16 0740  PainSc: 6       Patients Stated Pain Goal: 6 ([id number])  Complications: No apparent anesthesia complications

## 2016-02-21 NOTE — Anesthesia Postprocedure Evaluation (Signed)
Anesthesia Post Note  Patient: Vincent [name]  Procedure(s) Performed: Procedure(s) (LRB): SCROTAL ORCHIECTOMY with TESTICULAR PROSTHESIS IMPLANT (Right)  Patient location during evaluation: PACU Anesthesia Type: General Level of consciousness: sedated and patient cooperative Pain management: pain level controlled Vital Signs Assessment: post-procedure vital signs reviewed and stable Respiratory status: spontaneous breathing Cardiovascular status: stable Anesthetic complications: no    Last Vitals:  Vitals:   02/21/16 0944 02/21/16 1119  BP:  131/72  Pulse: 76 73  Resp: 12   Temp: 36.7 C 36.7 C    Last Pain:  Vitals:   02/21/16 1119  PainSc: 0-No pain                 Nolon Nations

## 2016-02-21 NOTE — H&P (Signed)
HPI: Vincent [name] is a 50 year-old male with right testicular pain.  He has had the symptoms for 1 month. He has swelling in the right testicle. He has had levaquin to treat the scrotal pain. The treatment was not successful.   He does have pain or burning when he urinates. He does not have hesitancy or straining. He is not having problems with emptying his bladder well.   History of bladder cancer with last TURBT in September of this year. He was seen earlier this month for ongoing pelvic pain and dysuria. This has improved greatly since beginning uribel.    01/07/16: C/o right constant testicular for 2.5 weeks. Worse with movement. Denies fevers or hemamturia.  He said he does not have any true dysuria but at the very termination of his urination he will note some very slight discomfort. This pain is constant and is moderately severe.   Interval history: 01/25/16: When I saw him on 01/18/16 he had taken Levaquin for 2 weeks without improvement and I switched him to doxycycline. Despite these 2 antibiotics he has not noted improvement. He said when he lays down flat he will get some relief but movement, standing and clothing applying pressure make it worse. He not seen any swelling or redness. He has no voiding symptoms. He said he may have developed some mild nausea now.     NIH Symptom Score: He has experienced pain in the testicles and tip of the penis. He has experienced pain or burning during urination. He has always had pain or discomfort in any of these areas over the last week. Over the last week, when he had pain or discomfort, he rated it a 7 on a scale of 0-10. If he were able to spend the rest of his life with his symptoms just the way they have been during the last week, he would feel unhappy.     ALLERGIES: CeleBREX CAPS Hydrocodone-Acetaminophen CAPS Sulfa Drugs    MEDICATIONS: Doxycycline Hyclate 100 mg capsule 1 capsule PO Q 12 H  Oxycodone Hcl 20 mg tablet 1 tablet PO Q 4 H   Oxycodone Hcl 10 mg tablet 1-2 tablet PO Q 4 H  Atorvastatin Calcium 10 MG Oral Tablet Oral  Duragesic-100 100 MCG/HR Transdermal Patch 72 Hour Transdermal  Folic Acid TABS Oral  Gabapentin 600 MG Oral Tablet Oral  HumaLOG Mix 75/25 KwikPen (75-25) 100 UNIT/ML Subcutaneous Suspension Pen-injector Subcutaneous  Levothyroxine Sodium 25 MCG Oral Tablet Oral  Lyrica 100 MG Oral Capsule Oral  Meloxicam 15 mg tablet 1 tablet PO Daily  Metoclopramide HCl - 5 MG Oral Tablet Oral  Rasuvo  Tamsulosin HCl - 0.4 MG Oral Capsule 0 Oral  Testosterone Cypionate 200 MG/ML Intramuscular Solution 0 Intramuscular     GU PSH: Bladder Instill AntiCA Agent - 2014 Cysto Bladder Ureth Biopsy - 11/26/2015 Cysto Fulgurate < 0.5 cm - 11/26/2015 Cystoscopy - 11/02/2015 Cystoscopy TURBT <2 cm - 03/07/2014 Cystoscopy TURBT 2-5 cm - 2014      PSH Notes: Cystoscopy With Fulguration Small Lesion (5-28mm), Bladder Injection Of Cancer Treatment, Cystoscopy With Fulguration Medium Lesion (2-5cm), Cholecystectomy, Wrist Arthroscopy With Release Of Transverse Carpal Ligament, Rotator Cuff Repair, Elective Circumcision   NON-GU PSH: Cholecystectomy - 2014 Wrist Endoscopy/surgery - 2014    GU PMH: Bladder Cancer, History (Stable), He has a history of bladder cancer although he is not having any urinary symptoms. - 01/18/2016, (Stable), I have given him samples of Uribel to see if this seems to help  any better and he is going to contact me in a couple of weeks if he is not noting improvement., - 12/17/2015 (Worsening), Left, There is an area just posterior to his left ureteral orifice that I had noted previously. It doesn't really appear that it has progressed but I think it may be just a little bit too large to try and fulguration in the office without causing the patient discomfort and therefore I recommended doing this as an outpatient under anesthesia. He is in agreement with that., - 11/02/2015, History of carcinoma of  bladder, - 07/22/2015 Epididymitis (Worsening, Acute), Right, He clearly has tenderness in the epididymis and we discussed the possible etiologies. I think infectious etiology is probably the most likely. I told him that his recent surgery would not have caused this and he did receive perioperative antibiotics for the procedure. I am going to place him on a different antibiotic with a different spectrum and also give him further pain medication. It is possible this may be a spermatocele which would be self-limited but are now I'm going to continue antibiotic therapy and pain medication with a scrotal ultrasound if she does not note improvement. - 01/18/2016, Right, - 01/07/2016 Dysuria, We discussed cystoscopy as a way to evaluate this as he does not appear to have any infection but he wanted to hold off on that. I think that's reasonable as I suspect this will resolve over time. - 12/17/2015 BPH w/LUTS, Benign prostatic hyperplasia with hesitancy - 08/13/2015 Testicular hypofunction, Hypogonadism, testicular - 04/22/2015 Other microscopic hematuria, Microscopic hematuria - 2016 Urinary Urgency, Urinary urgency - 2016 Encounter for Prostate Cancer screening, Prostate cancer screening - 2015 Other Disorder Prostate, Acquired asymmetry of prostate - 72 Male ED, unspecified, Erectile dysfunction - 2014      PMH Notes: History of phimosis: He developed phimosis secondary to diabetes in 10/03 and underwent a circumcision in 2/05.   Transitional cell carcinoma of the bladder: He was found to have 7-10 rbc's/hpf. He has a 60-pack-year smoking history. He was found to have a papillary tumor on the left posterior wall of his bladder and underwent TURBT with postoperative mitomycin-C on 08/09/12.  Pathology: Papillary transitional cell carcinoma (Ta,G1)  In 12/15 lesion was noted in the prostatic urethra at the bladder neck and was resected.  Pathology: Cystitis cystica and cystitis glandularis only with no  evidence of cancer.   Prostatic asymmetry: He was found on DRE to have some right lobe enlargement relative to the left however there were no worrisome findings and his PSA in 4/14 was 0.47.   LUTS: He noted the acute onset of hesitancy and intermittency with some associated suprapubic tenderness and intermittent bilateral testicular pain without associated dysuria.   Primary hypogonadism: He was experiencing a decreased libido and diminished erectile quality. He was found to have a serum of 167. Further evaluation revealed a normal FSH, LH and prolactin indicating primary hypogonadism.  Previously tried: AndroGel 2 pumps & Testim  Current therapy: Testosterone injections 100 mg q.3 weeks     NON-GU PMH: Anxiety, Anxiety (Symptom) - 2014 Gastric ulcer, unspecified as acute or chronic, without hemorrhage or perforation, Gastric Ulcer - 2014 Personal history of other diseases of the circulatory system, History of hypertension - 2014 Personal history of other diseases of the digestive system, History of esophageal reflux - 2014 Personal history of other endocrine, nutritional and metabolic disease, History of hypercholesterolemia - 2014, History of hypothyroidism, - 2014, History of diabetes mellitus, - 2014 Personal history of  other mental and behavioral disorders, History of depression - 2014    FAMILY HISTORY: Acute Myocardial Infarction - Father Death In The Family Father - Runs In Family Diabetes - Runs In [place] _4__ Living Daughter - Runs In Family Family Health Status Of Mother - Alive 67 - Runs In Family nephrolithiasis - Father renal failure - Father   SOCIAL HISTORY: Marital Status: Married Current Smoking Status: Patient smokes. Smokes 2 packs per day.  Does not drink anymore.  Does not use drugs. Drinks 4+ caffeinated drinks per day.     Notes: Current every day smoker, Tobacco use, Alcohol Use, Caffeine Use, Marital History - Currently Married    REVIEW OF SYSTEMS:    GU Review Male:   Patient reports stream starts and stops and trouble starting your stream. Patient denies frequent urination, hard to postpone urination, burning/ pain with urination, get up at night to urinate, leakage of urine, have to strain to urinate , erection problems, and penile pain.  Gastrointestinal (Upper):   Patient reports indigestion/ heartburn and nausea. Patient denies vomiting.  Gastrointestinal (Lower):   Patient denies diarrhea and constipation.  Constitutional:   Patient denies fever, night sweats, weight loss, and fatigue.  Skin:   Patient denies skin rash/ lesion and itching.  Eyes:   Patient denies blurred vision and double vision.  Ears/ Nose/ Throat:   Patient denies sore throat and sinus problems.  Hematologic/Lymphatic:   Patient denies swollen glands and easy bruising.  Cardiovascular:   Patient denies leg swelling and chest pains.  Respiratory:   Patient denies cough and shortness of breath.  Endocrine:   Patient denies excessive thirst.  Musculoskeletal:   Patient reports back pain. Patient denies joint pain.  Neurological:   Patient denies headaches and dizziness.  Psychologic:   Patient denies depression and anxiety.   VITAL SIGNS:    Weight 236 lb / 107.05 kg  Height 72 in / 182.88 cm  BP 141/76 mmHg  Pulse 69 /min  BMI 32.0 kg/m   GU PHYSICAL EXAMINATION:    Scrotum: No lesions. No edema. No cysts. No warts.  Epididymides: Right: right head tender. Right: No spermatocele, no masses, no cysts, no induration, no enlargement. Left: No spermatocele, no masses, no cysts, no tenderness, no induration, no enlargement.   Testes: No tenderness, no swelling, no enlargement left testes. No tenderness, no swelling, no enlargement right testes. Normal location left testes. Normal location right testes. No mass, no cyst, no varicocele, no hydrocele left testes. No mass, no cyst, no varicocele, no hydrocele right testes.  Urethral Meatus:  Normal size. No lesion, no wart, no discharge, no polyp. Normal location.  Penis: Circumcised, no warts, no cracks. No dorsal Peyronie's plaques, no left corporal Peyronie's plaques, no right corporal Peyronie's plaques, no scarring, no warts. No balanitis, no meatal stenosis.   MULTI-SYSTEM PHYSICAL EXAMINATION:       PAST DATA REVIEWED:  Source Of History:  Patient  Records Review:   Previous Patient Records, POC Tool   12/05/13 08/02/13 07/09/12  PSA  Total PSA 0.83  0.53  0.47     12/05/13 08/02/13 02/04/13 01/17/13  Hormones  Testosterone, Total 219  212  232  167     PROCEDURES:         Scrotal Ultrasound - [id number]  Right Testicle: Length: 5.1 cm  Height: 2.6 cm  Width: 3.5 cm  Left Testicle: Length: 4.9 cm  Height: 2.4 cm  Width: 3.2 cm  Scrotal Wall:  Normal scrotal wall.  Left Testis/Epididymis:  Small cystic areas LT epididymis, largest = .3x.2x.3cm  Right Testis/Epididymis:  Inhomogenous epididymis but without increased blood flow.       Small bilateral subclinical hydoceles. No varicocele seen today.  His right epididymis seems to be fairly normal without increased blood flow.         Urinalysis w/Scope Dipstick Dipstick Cont'd Micro  Color: Yellow Bilirubin: Neg WBC/hpf: 0 - 5/hpf  Appearance: Clear Ketones: Trace RBC/hpf: 3 - 10/hpf  Specific Gravity: 1.020 Blood: Trace Bacteria: Rare (0-9/hpf)  pH: 6.0 Protein: Neg Cystals: NS (Not Seen)  Glucose: 3+ Urobilinogen: 0.2 Casts: NS (Not Seen)    Nitrites: Neg Trichomonas: Not Present    Leukocyte Esterase: Neg Mucous: Not Present      Epithelial Cells: 0 - 5/hpf      Yeast: NS (Not Seen)      Sperm: Not Present    ASSESSMENT:      ICD-10 Details  1 GU:   Epididymitis - N45.1 Right, Worsening - He continues to have fairly significant pain. There was no evidence of abscess or neoplasm in his epididymis. This is the area that is tender. I don't feel that this is due to any nerve impingement in his back  since he is quite tender to palpation. Ice has made it worse so I recommended the use of a heating pad and I am going to put him on nonsteroidal anti-inflammatory medication. I do not feel further antibiotics are necessary.  He then underwent a cord block. For 3 days he was pain-free. The pain has gradually recurred. We discussed the fact that because his pain resolved with a cord block this indicates the source of his pain is below the level of the block IE the testicle. I told him that I did not typically recommend surgery for pain but it would appear all other options have been exhausted and he would like to be pain-free. I therefore have discussed right simple orchiectomy with him. We went over the procedure used, the incision, the outpatient nature of the procedure and I offered to place a testicular prosthesis although he initially declined but decided to proceed with a prosthesis as well. We discussed the anticipated postoperative course and he has elected to proceed.    PLAN:  Right scrotal orchiectomy and placement of testicular prosthesis.

## 2016-02-21 NOTE — Anesthesia Preprocedure Evaluation (Addendum)
Anesthesia Evaluation  Patient identified by MRN, date of birth, ID band Patient awake    Reviewed: Allergy & Precautions, NPO status , Patient's Chart, lab work & pertinent test results  Airway Mallampati: II  TM Distance: >3 FB Neck ROM: Full    Dental no notable dental hx.    Pulmonary sleep apnea , COPD, Current Smoker,    Pulmonary exam normal breath sounds clear to auscultation       Cardiovascular Normal cardiovascular exam Rhythm:Regular Rate:Normal     Neuro/Psych Seizures -,  PSYCHIATRIC DISORDERS Anxiety Depression Bipolar Disorder  Neuromuscular disease    GI/Hepatic hiatal hernia, GERD  Medicated,  Endo/Other  diabetes, Poorly Controlled, Type 2, Insulin DependentHypothyroidism   Renal/GU      Musculoskeletal  (+) Arthritis , Osteoarthritis,    Abdominal   Peds  Hematology  (+) anemia ,   Anesthesia Other Findings   Reproductive/Obstetrics                           Anesthesia Physical Anesthesia Plan  ASA: III  Anesthesia Plan: General   Post-op Pain Management:    Induction: Intravenous  Airway Management Planned: LMA  Additional Equipment:   Intra-op Plan:   Post-operative Plan: Extubation in OR  Informed Consent: I have reviewed the patients History and Physical, chart, labs and discussed the procedure including the risks, benefits and alternatives for the proposed anesthesia with the patient or authorized representative who has indicated his/her understanding and acceptance.   Dental advisory given  Plan Discussed with: CRNA  Anesthesia Plan Comments:         Anesthesia Quick Evaluation

## 2016-02-21 NOTE — Op Note (Signed)
PATIENT:  Vincent [name]  PRE-OPERATIVE DIAGNOSIS: Right testicular pain  POST-OPERATIVE DIAGNOSIS: Same  PROCEDURE: 1. Right scrotal orchiectomy 2. Right testicular prosthesis implantation  SURGEON:  Claybon Jabs  INDICATION: Vincent [name] is a 50 year old male who has developed persistent, unrelenting, severe right testicular pain that has not responded to conservative therapy. He did undergo a cord block which resulted in complete resolution of his pain but it recurred about 3 days later. We therefore have discussed the options and he has elected to proceed with an orchiectomy. He would like to have a testicular prosthesis implanted.  ANESTHESIA:  General  EBL:  Minimal  DRAINS: None  LOCAL MEDICATIONS USED:  1/2% Marcaine with epinephrine  SPECIMEN:    Description of procedure: After informed consent the patient was taken to the operating room and placed on the table in a supine position. General anesthesia was then administered. Once fully anesthetized the patient the genitalia were sterilely prepped and draped in standard fashion. An official timeout was then performed.  Examination under anesthesia revealed no abnormality of the testicle to palpation. I therefore chose a scrotal rugae in the superior aspect of the scrotum and made an incision at this location. I then carried this down to the tunica vaginalis. This was opened and drained of a small amount of clear yellow hydrocele fluid. The testicle was then delivered and inspected. It was noted to be normal in appearance. The appendix testis was intact and appeared normal. The epididymis also appeared normal. There were no palpable areas of firmness or abnormality noted.  I cleared off the spermatic cord and obtained a good length of cord. I then used Kelly clamps and first isolated the cord into 2 equal sized packets. I placed a Kelly clamp across each of these and then divided the cord distal to the clamps. I then doubly  ligated both of the cord stumps first with a 2-0 suture ligature using Vicryl and then a 2-0 silk tie. I infiltrated the stumps with the local anesthetic.  I then chose the large testicular prosthesis and filled it with saline using a 21-gauge needle. I then everted the scrotum by placing a Allis clamp at the dependent portion of the scrotum and used a 3-0 silk to place this in the deep scrotal tissue. It was placed through the eye of the testicular prosthesis and then tied down. I let the prosthesis dropped into the right hemiscrotum where a good, normal appearing anatomic position appeared to have been obtained. I therefore irrigated the scrotum copiously with sterile saline. I then closed the deep scrotal tissue with running, locking 3-0 chromic suture, infiltrated the subcutaneous tissue with local anesthetic and then closed the skin with a running 3-0 chromic suture. Dermabond was applied and then a sterile gauze followed by fluffed Curlex and a scrotal support. The patient was then awakened and taken to the recovery room in stable and satisfactory condition. He tolerated the procedure well no intraoperative complications. Needle, sponge and instrument counts were correct 2.  PLAN OF CARE: Discharge to home after PACU  PATIENT DISPOSITION:  PACU - hemodynamically stable.

## 2016-02-22 ENCOUNTER — Encounter (HOSPITAL_BASED_OUTPATIENT_CLINIC_OR_DEPARTMENT_OTHER): Payer: Self-pay | Admitting: Urology

## 2016-03-14 ENCOUNTER — Other Ambulatory Visit: Payer: Self-pay | Admitting: Endocrinology

## 2016-03-14 NOTE — Telephone Encounter (Signed)
Please refill x 1 Ov is due  

## 2016-03-16 DIAGNOSIS — Z8551 Personal history of malignant neoplasm of bladder: Secondary | ICD-10-CM | POA: Diagnosis not present

## 2016-04-04 DIAGNOSIS — Z79891 Long term (current) use of opiate analgesic: Secondary | ICD-10-CM | POA: Diagnosis not present

## 2016-04-05 DIAGNOSIS — M064 Inflammatory polyarthropathy: Secondary | ICD-10-CM | POA: Diagnosis not present

## 2016-04-05 DIAGNOSIS — M79643 Pain in unspecified hand: Secondary | ICD-10-CM | POA: Diagnosis not present

## 2016-04-05 DIAGNOSIS — M5442 Lumbago with sciatica, left side: Secondary | ICD-10-CM | POA: Diagnosis not present

## 2016-04-05 DIAGNOSIS — Z79899 Other long term (current) drug therapy: Secondary | ICD-10-CM | POA: Diagnosis not present

## 2016-04-09 DIAGNOSIS — L03221 Cellulitis of neck: Secondary | ICD-10-CM | POA: Diagnosis not present

## 2016-04-26 ENCOUNTER — Other Ambulatory Visit: Payer: Self-pay | Admitting: Endocrinology

## 2016-04-26 ENCOUNTER — Other Ambulatory Visit: Payer: Self-pay | Admitting: Neurology

## 2016-05-05 ENCOUNTER — Ambulatory Visit (HOSPITAL_COMMUNITY)
Admission: RE | Admit: 2016-05-05 | Discharge: 2016-05-05 | Disposition: A | Discharge status: Home / Self Care | Payer: 59 | Attending: Psychiatry | Admitting: Psychiatry

## 2016-05-05 ENCOUNTER — Encounter (HOSPITAL_COMMUNITY): Payer: Self-pay | Admitting: *Deleted

## 2016-05-05 ENCOUNTER — Emergency Department (HOSPITAL_COMMUNITY)
Admission: EM | Admit: 2016-05-05 | Discharge: 2016-05-05 | Disposition: A | Discharge status: Other Acute Inpatient Hospital | Payer: 59 | Attending: Emergency Medicine | Admitting: Emergency Medicine

## 2016-05-05 ENCOUNTER — Encounter: Payer: Self-pay | Admitting: Psychiatry

## 2016-05-05 ENCOUNTER — Inpatient Hospital Stay
Admission: RE | Admit: 2016-05-05 | Discharge: 2016-05-06 | DRG: 885 | Disposition: A | Discharge status: Home / Self Care | Payer: 59 | Source: Intra-hospital | Attending: Psychiatry | Admitting: Psychiatry

## 2016-05-05 DIAGNOSIS — R45851 Suicidal ideations: Secondary | ICD-10-CM | POA: Diagnosis present

## 2016-05-05 DIAGNOSIS — E1143 Type 2 diabetes mellitus with diabetic autonomic (poly)neuropathy: Secondary | ICD-10-CM | POA: Diagnosis present

## 2016-05-05 DIAGNOSIS — R7309 Other abnormal glucose: Secondary | ICD-10-CM

## 2016-05-05 DIAGNOSIS — F329 Major depressive disorder, single episode, unspecified: Secondary | ICD-10-CM | POA: Insufficient documentation

## 2016-05-05 DIAGNOSIS — F39 Unspecified mood [affective] disorder: Secondary | ICD-10-CM | POA: Diagnosis not present

## 2016-05-05 DIAGNOSIS — Z888 Allergy status to other drugs, medicaments and biological substances status: Secondary | ICD-10-CM | POA: Diagnosis not present

## 2016-05-05 DIAGNOSIS — K219 Gastro-esophageal reflux disease without esophagitis: Secondary | ICD-10-CM | POA: Diagnosis present

## 2016-05-05 DIAGNOSIS — Z8551 Personal history of malignant neoplasm of bladder: Secondary | ICD-10-CM | POA: Diagnosis not present

## 2016-05-05 DIAGNOSIS — F17210 Nicotine dependence, cigarettes, uncomplicated: Secondary | ICD-10-CM | POA: Diagnosis present

## 2016-05-05 DIAGNOSIS — J449 Chronic obstructive pulmonary disease, unspecified: Secondary | ICD-10-CM | POA: Diagnosis present

## 2016-05-05 DIAGNOSIS — E039 Hypothyroidism, unspecified: Secondary | ICD-10-CM | POA: Insufficient documentation

## 2016-05-05 DIAGNOSIS — F17200 Nicotine dependence, unspecified, uncomplicated: Secondary | ICD-10-CM | POA: Diagnosis present

## 2016-05-05 DIAGNOSIS — N529 Male erectile dysfunction, unspecified: Secondary | ICD-10-CM | POA: Diagnosis present

## 2016-05-05 DIAGNOSIS — E1165 Type 2 diabetes mellitus with hyperglycemia: Secondary | ICD-10-CM | POA: Diagnosis not present

## 2016-05-05 DIAGNOSIS — E781 Pure hyperglyceridemia: Secondary | ICD-10-CM | POA: Diagnosis present

## 2016-05-05 DIAGNOSIS — R4589 Other symptoms and signs involving emotional state: Secondary | ICD-10-CM

## 2016-05-05 DIAGNOSIS — E11649 Type 2 diabetes mellitus with hypoglycemia without coma: Secondary | ICD-10-CM | POA: Diagnosis present

## 2016-05-05 DIAGNOSIS — Z79899 Other long term (current) drug therapy: Secondary | ICD-10-CM | POA: Insufficient documentation

## 2016-05-05 DIAGNOSIS — K3184 Gastroparesis: Secondary | ICD-10-CM | POA: Diagnosis present

## 2016-05-05 DIAGNOSIS — Z8673 Personal history of transient ischemic attack (TIA), and cerebral infarction without residual deficits: Secondary | ICD-10-CM | POA: Diagnosis not present

## 2016-05-05 DIAGNOSIS — Z882 Allergy status to sulfonamides status: Secondary | ICD-10-CM | POA: Diagnosis not present

## 2016-05-05 DIAGNOSIS — R569 Unspecified convulsions: Secondary | ICD-10-CM | POA: Diagnosis present

## 2016-05-05 DIAGNOSIS — Z794 Long term (current) use of insulin: Secondary | ICD-10-CM

## 2016-05-05 DIAGNOSIS — G894 Chronic pain syndrome: Secondary | ICD-10-CM | POA: Diagnosis present

## 2016-05-05 DIAGNOSIS — E119 Type 2 diabetes mellitus without complications: Secondary | ICD-10-CM

## 2016-05-05 DIAGNOSIS — G4733 Obstructive sleep apnea (adult) (pediatric): Secondary | ICD-10-CM | POA: Diagnosis present

## 2016-05-05 DIAGNOSIS — G8929 Other chronic pain: Secondary | ICD-10-CM | POA: Diagnosis present

## 2016-05-05 DIAGNOSIS — Z720 Tobacco use: Secondary | ICD-10-CM | POA: Diagnosis present

## 2016-05-05 DIAGNOSIS — Z833 Family history of diabetes mellitus: Secondary | ICD-10-CM

## 2016-05-05 DIAGNOSIS — F3181 Bipolar II disorder: Secondary | ICD-10-CM | POA: Diagnosis not present

## 2016-05-05 DIAGNOSIS — E785 Hyperlipidemia, unspecified: Secondary | ICD-10-CM | POA: Diagnosis present

## 2016-05-05 LAB — BASIC METABOLIC PANEL
Anion gap: 8 (ref 5–15)
BUN: 18 mg/dL (ref 6–20)
CO2: 24 mmol/L (ref 22–32)
Calcium: 9.2 mg/dL (ref 8.9–10.3)
Chloride: 105 mmol/L (ref 101–111)
Creatinine, Ser: 0.89 mg/dL (ref 0.61–1.24)
GFR calc Af Amer: >60 mL/min (ref >60)
GFR calc non Af Amer: >60 mL/min (ref >60)
Glucose, Bld: 287 mg/dL — ABNORMAL HIGH (ref 65–99)
Potassium: 4.1 mmol/L (ref 3.5–5.1)
Sodium: 137 mmol/L (ref 135–145)

## 2016-05-05 LAB — CBC WITH DIFFERENTIAL/PLATELET
Basophils Absolute: 0 10*3/uL (ref 0.0–0.1)
Basophils Relative: 0 %
Eosinophils Absolute: 0.2 10*3/uL (ref 0.0–0.7)
Eosinophils Relative: 2 %
HCT: 40.8 % (ref 39.0–52.0)
Hemoglobin: 14.8 g/dL (ref 13.0–17.0)
Lymphocytes Relative: 40 %
Lymphs Abs: 2.7 10*3/uL (ref 0.7–4.0)
MCH: 30.6 pg (ref 26.0–34.0)
MCHC: 36.3 g/dL — ABNORMAL HIGH (ref 30.0–36.0)
MCV: 84.5 fL (ref 78.0–100.0)
Monocytes Absolute: 0.5 10*3/uL (ref 0.1–1.0)
Monocytes Relative: 7 %
Neutro Abs: 3.5 10*3/uL (ref 1.7–7.7)
Neutrophils Relative %: 51 %
Platelets: 210 10*3/uL (ref 150–400)
RBC: 4.83 MIL/uL (ref 4.22–5.81)
RDW: 13.3 % (ref 11.5–15.5)
WBC: 6.8 10*3/uL (ref 4.0–10.5)

## 2016-05-05 LAB — ETHANOL: Alcohol, Ethyl (B): <5 mg/dL (ref <5)

## 2016-05-05 LAB — GLUCOSE, CAPILLARY: Glucose-Capillary: 90 mg/dL (ref 65–99)

## 2016-05-05 MED ORDER — FENTANYL 12 MCG/HR TD PT72: 100.0000 ug | MEDICATED_PATCH | TRANSDERMAL | Status: DC

## 2016-05-05 MED ORDER — ALUM & MAG HYDROXIDE-SIMETH 200-200-20 MG/5ML PO SUSP: 30.0000 mL | ORAL | Status: DC | PRN

## 2016-05-05 MED ORDER — INSULIN ASPART 100 UNIT/ML ~~LOC~~ SOLN: 0.0000 [IU] | Freq: Every day | SUBCUTANEOUS | Status: DC

## 2016-05-05 MED ORDER — MAGNESIUM HYDROXIDE 400 MG/5ML PO SUSP: 30.0000 mL | Freq: Every day | ORAL | Status: DC | PRN

## 2016-05-05 MED ORDER — DIVALPROEX SODIUM ER 500 MG PO TB24: 1000.0000 mg | ORAL_TABLET | Freq: Every day | ORAL | Status: DC

## 2016-05-05 MED ORDER — LAMOTRIGINE 25 MG PO TABS: 200.0000 mg | ORAL_TABLET | Freq: Two times a day (BID) | ORAL | Status: DC

## 2016-05-05 MED ORDER — FENTANYL 100 MCG/HR TD PT72
100.0000 ug | MEDICATED_PATCH | TRANSDERMAL | Status: DC
  Filled 2016-05-05: qty 8
  Filled 2016-05-05: qty 1

## 2016-05-05 MED ORDER — GABAPENTIN 300 MG PO CAPS: 1200.0000 mg | ORAL_CAPSULE | Freq: Two times a day (BID) | ORAL | Status: DC

## 2016-05-05 MED ORDER — PANTOPRAZOLE SODIUM 40 MG PO TBEC
40.0000 mg | DELAYED_RELEASE_TABLET | Freq: Every day | ORAL | Status: DC
  Administered 2016-05-06: 40 mg via ORAL
  Filled 2016-05-05: qty 1

## 2016-05-05 MED ORDER — INSULIN LISPRO PROT & LISPRO (75-25 MIX) 100 UNIT/ML KWIKPEN: 60.0000 [IU] | PEN_INJECTOR | Freq: Every day | SUBCUTANEOUS | Status: DC

## 2016-05-05 MED ORDER — LEVOTHYROXINE SODIUM 25 MCG PO TABS
25.0000 ug | ORAL_TABLET | Freq: Every day | ORAL | Status: DC
  Administered 2016-05-06: 25 ug via ORAL
  Filled 2016-05-05: qty 1

## 2016-05-05 MED ORDER — INSULIN ASPART PROT & ASPART (70-30 MIX) 100 UNIT/ML ~~LOC~~ SUSP: 60.0000 [IU] | Freq: Every day | SUBCUTANEOUS | Status: DC

## 2016-05-05 MED ORDER — INSULIN ASPART 100 UNIT/ML ~~LOC~~ SOLN: 0.0000 [IU] | Freq: Three times a day (TID) | SUBCUTANEOUS | Status: DC

## 2016-05-05 MED ORDER — DIVALPROEX SODIUM ER 500 MG PO TB24: 1000.0000 mg | ORAL_TABLET | Freq: Two times a day (BID) | ORAL | Status: DC

## 2016-05-05 MED ORDER — HYDROXYCHLOROQUINE SULFATE 200 MG PO TABS: 200.0000 mg | ORAL_TABLET | Freq: Every day | ORAL | Status: DC

## 2016-05-05 MED ORDER — OMEGA-3-ACID ETHYL ESTERS 1 G PO CAPS
2.0000 g | ORAL_CAPSULE | Freq: Two times a day (BID) | ORAL | Status: DC
  Administered 2016-05-06: 2 g via ORAL
  Filled 2016-05-05 (×2): qty 2

## 2016-05-05 MED ORDER — FOLIC ACID 1 MG PO TABS
2.0000 mg | ORAL_TABLET | Freq: Every day | ORAL | Status: DC
  Administered 2016-05-06: 2 mg via ORAL
  Filled 2016-05-05: qty 2

## 2016-05-05 MED ORDER — LAMOTRIGINE 100 MG PO TABS: 200.0000 mg | ORAL_TABLET | Freq: Two times a day (BID) | ORAL | Status: DC

## 2016-05-05 MED ORDER — PANTOPRAZOLE SODIUM 40 MG PO TBEC: 40.0000 mg | DELAYED_RELEASE_TABLET | Freq: Every day | ORAL | Status: DC

## 2016-05-05 MED ORDER — INSULIN ASPART PROT & ASPART (70-30 MIX) 100 UNIT/ML ~~LOC~~ SUSP: 160.0000 [IU] | Freq: Every day | SUBCUTANEOUS | Status: DC

## 2016-05-05 MED ORDER — ATORVASTATIN CALCIUM 20 MG PO TABS: 10.0000 mg | ORAL_TABLET | Freq: Every day | ORAL | Status: DC

## 2016-05-05 MED ORDER — NALOXEGOL OXALATE 25 MG PO TABS
25.0000 mg | ORAL_TABLET | Freq: Every day | ORAL | Status: DC
Start: 2016-05-05 — End: 2016-05-06

## 2016-05-05 MED ORDER — LEVOTHYROXINE SODIUM 25 MCG PO TABS: 25.0000 ug | ORAL_TABLET | Freq: Every day | ORAL | Status: DC

## 2016-05-05 MED ORDER — PREGABALIN 50 MG PO CAPS: 100.0000 mg | ORAL_CAPSULE | Freq: Two times a day (BID) | ORAL | Status: DC

## 2016-05-05 MED ORDER — PREGABALIN 75 MG PO CAPS
150.0000 mg | ORAL_CAPSULE | Freq: Three times a day (TID) | ORAL | Status: DC
  Administered 2016-05-06 (×2): 150 mg via ORAL
  Filled 2016-05-05 (×2): qty 2

## 2016-05-05 MED ORDER — GABAPENTIN 300 MG PO CAPS
600.0000 mg | ORAL_CAPSULE | Freq: Four times a day (QID) | ORAL | Status: DC
  Administered 2016-05-05 – 2016-05-06 (×3): 600 mg via ORAL
  Filled 2016-05-05 (×3): qty 2

## 2016-05-05 MED ORDER — ATORVASTATIN CALCIUM 10 MG PO TABS: 10.0000 mg | ORAL_TABLET | Freq: Every morning | ORAL | Status: DC

## 2016-05-05 MED ORDER — ACETAMINOPHEN 325 MG PO TABS: 650.0000 mg | ORAL_TABLET | Freq: Four times a day (QID) | ORAL | Status: DC | PRN

## 2016-05-05 MED ORDER — INSULIN ASPART PROT & ASPART (70-30 MIX) 100 UNIT/ML ~~LOC~~ SUSP
60.0000 [IU] | Freq: Every day | SUBCUTANEOUS | Status: DC
  Administered 2016-05-05: 60 [IU] via SUBCUTANEOUS
  Filled 2016-05-05: qty 10

## 2016-05-05 MED ORDER — FOLIC ACID 1 MG PO TABS: 1.0000 mg | ORAL_TABLET | Freq: Every day | ORAL | Status: DC

## 2016-05-05 NOTE — Tx Team (Signed)
Initial Treatment Plan 05/05/2016 11:00 PM [name] [id number]    PATIENT STRESSORS: Marital or family conflict   PATIENT STRENGTHS: Average or above average intelligence Capable of independent living Communication skills Motivation for treatment/growth   PATIENT IDENTIFIED PROBLEMS:   " I don't even know"  " I don't want to be here"                 DISCHARGE CRITERIA:  Improved stabilization in mood, thinking, and/or behavior  PRELIMINARY DISCHARGE PLAN: Outpatient therapy Return to previous living arrangement  PATIENT/FAMILY INVOLVEMENT: This treatment plan has been presented to and reviewed with the patient, Vincent [name], and/or family member,  The patient and family have been given the opportunity to ask questions and make suggestions.  Amie Portland, RN 05/05/2016, 11:00 PM

## 2016-05-05 NOTE — ED Triage Notes (Signed)
Pt transported from [facility] via IT consultant with IVC.  Pt reports he went to [facility] to get outpt services.  He reports having SI last month, had to take his pistol to his mother-in-law's for safety.  Pt denies SI at present.  Pt is calm and cooperative.

## 2016-05-05 NOTE — ED Notes (Signed)
Patient is to be admitted to [facility] by Dr.  Bary Leriche.   Attending Physician will be Dr.  Bary Leriche.  Patient has been assigned to room 309, by [place] .   (Patient Access) is aware of the admission   Referring Representative was [place].  Call report to [phone].    Please collect labs prior to transfer.
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
## 2016-05-05 NOTE — Progress Notes (Addendum)
Pt admitted to unit without issue. Skin assessment complete with RN. No contraband was found.  Pt noted to have two small lacerations to left side of  neck in which pt states he cut neck when changing oil underneath his truck. Pt noted to have several tattoos. No other abnormalities noted.   Pt A&O x 4. States that he went to the hospital for help to set up outpatient therapy and that before he knew if they had placed him under IVC. Pt currently denies SI/HI/AVH. He stated "If I ever feel like I need help again, one thing is for sure, I will not be getting it from a place like this". Pt affect irritable related to his admission. He feels that his needs can be addressed via outpatient services. Pt denies pain. Voices no additional concerns at this time. Pt asked to give urine sample, but he states that he had just gone to the bathroom. Cup placed in Pts bathroom. Pt was visible in dayroom with appropriate behaviors among staff and peers. Voices no additional concerns at this time. Safety maintained. Oriented to unit.

## 2016-05-05 NOTE — ED Notes (Signed)
Gave report to [place], Therapist, sports at [facility]

## 2016-05-05 NOTE — ED Notes (Signed)
tx will be here in 30 minutes

## 2016-05-05 NOTE — ED Provider Notes (Signed)
[facility] DEPT Provider Note   CSN: [id number] Arrival date & time: 05/05/16  1506     History   Chief Complaint Chief Complaint  Patient presents with  . IVC  . Medical Clearance    HPI Vincent [name] is a 51 y.o. male.  The history is provided by the patient and medical records. No language interpreter was used.   Vincent [name] is a 51 y.o. male  with a PMH of bipolar disorder, chronic, pain, HTN, HLD, DM who presents to the Emergency Department From behavioral health for medical clearance. Patient states that he went to behavioral health today voluntarily to schedule an appointment with a psychiatrist. Patient states that he and his wife have been having difficulties, "bickering back and forth". Patient endorses a difficulty with "letting things go" and presented to the [facility] today hoping to talk with someone to address this. He states that they asked him if he underwent an and he said yes. He told them that he had just taken his skin to his mother-in-law's home and had her locket in the coming closet. He denies any suicidal or homicidal ideations. He states that this weapon is typically held in the gun closet and is now stored safely. Per [facility], patient was placed under IVC for concerns over being a threat to himself due to hx of mental illness and access to weapon. On my examination, patient clearly declines suicidal or homicidal ideation. No auditory or visual hallucinations. Patient states that he has been on medication for his bipolar and takes this as directed. He has a history of chronic back pain and is followed by pain management. He has been compliant with this medication regimen and is under pain contract.   Past Medical History:  Diagnosis Date  . Anxiety   . Arthritis   . Bipolar 1 disorder ([place])   . Chronic pain syndrome    back  . COPD (chronic obstructive pulmonary disease) ([place])   . Gastroparesis   . GERD (gastroesophageal reflux disease)   . Hiatal hernia     . History of bladder cancer urologist-  dr Consuella Lose   papillay TCC (Ta G1)  s/p TURBT and chemo instillation 2014  . History of encephalopathy 05/27/2015   admission w/ acute encephalopathy thought to be secondary to pain meds and COPD  . History of gastric ulcer   . History of kidney stones   . History of TIA (transient ischemic attack) 2008    no residual  . History of traumatic head injury 2010   w/ LOC  per pt needed stitches  . Hyperlipidemia   . Hypothyroidism   . Insomnia    per sleep study 04-19-2015 without sleep apnea  . Neuropathy in diabetes ([place])    LOWER EXTREMITIES  . Poor historian   . Seizures, transient [facility]) neurologist-  dr Krista Blue--  differential dx complex partial seizure .vs.  mood disorder .vs.  pseudoseizure--  negative EEG's   confusion episodes and staring spells since 11/ 2015  . Transient confusion NEUOROLOGIST-  DR YAN   Episodes since 11/ 2015--  neurologist dx  differential complex partial seizure  .vs. mood disorder . vs. pseudoseizure  . Type 2 diabetes mellitus treated with insulin ([place])   . Urinary hesitancy     Patient Active Problem List   Diagnosis Date Noted  . Insomnia secondary to chronic pain 06/02/2015  . Altered mental status 05/28/2015  . Acute bronchitis 05/28/2015  . Type 2 diabetes mellitus with hyperglycemia (  [place]) 05/28/2015  . Hypothyroidism 05/28/2015  . Bipolar I disorder, most recent episode depressed, severe without psychotic features ([place]) 05/28/2015  . History of rheumatoid arthritis 05/28/2015  . Chronic pain 05/28/2015  . Depression 03/18/2015  . Obstructive sleep apnea 03/18/2015  . Diabetes ([place]) 01/23/2015  . Obesity (BMI 30-39.9) 03/06/2014  . Acute encephalopathy 03/05/2014  . HLD (hyperlipidemia) 03/05/2014  . Anemia, normocytic normochromic 03/05/2014  . Altered mental state   . Protein-calorie malnutrition, severe ([place]) 10/09/2012  . Bladder tumor 08/08/2012  . Biliary dyskinesia 11/02/2010    Past  Surgical History:  Procedure Laterality Date  . CARDIAC CATHETERIZATION  12-27-2001  DR Einar Gip  &  05-26-2009  DR Irish Lack   RESULTS FOR BOTH ARE NORMAL CORONARIES AND PERSERVED LVF/ EF 60%  . CARPAL TUNNEL RELEASE Right 09-16-2003  . CARPAL TUNNEL RELEASE Left 02/25/2015   Procedure: LEFT CARPAL TUNNEL RELEASE;  Surgeon: Leanora Cover, MD;  Location: [facility];  Service: Orthopedics;  Laterality: Left;  . CYSTOSCOPY N/A 10/10/2012   Procedure: CYSTOSCOPY CLOT EVACUATION FULGERATION OF BLEEDERS ;  Surgeon: Claybon Jabs, MD;  Location: [facility];  Service: Urology;  Laterality: N/A;  . CYSTOSCOPY WITH BIOPSY N/A 11/26/2015   Procedure: CYSTOSCOPY WITH BIOPSY AND FULGURATION;  Surgeon: Kathie Rhodes, MD;  Location: [facility];  Service: Urology;  Laterality: N/A;  . LAPAROSCOPIC CHOLECYSTECTOMY  11-17-2010  . NEGATIVE SLEEP STUDY  04-19-2015  in epic  . ORCHIECTOMY Right 02/21/2016   Procedure: SCROTAL ORCHIECTOMY with TESTICULAR PROSTHESIS IMPLANT;  Surgeon: Kathie Rhodes, MD;  Location: [facility];  Service: Urology;  Laterality: Right;  . ROTATOR CUFF REPAIR Right 12/2004  . TRANSURETHRAL RESECTION OF BLADDER TUMOR N/A 08/09/2012   Procedure: TRANSURETHRAL RESECTION OF BLADDER TUMOR (TURBT) WITH GYRUS WITH MITOMYCIN C;  Surgeon: Claybon Jabs, MD;  Location: [facility];  Service: Urology;  Laterality: N/A;  . TRANSURETHRAL RESECTION OF BLADDER TUMOR WITH GYRUS (TURBT-GYRUS) N/A 02/27/2014   Procedure: TRANSURETHRAL RESECTION OF BLADDER TUMOR WITH GYRUS (TURBT-GYRUS);  Surgeon: Claybon Jabs, MD;  Location[facility];  Service: Urology;  Laterality: N/A;       Home Medications    Prior to Admission medications   Medication Sig Start Date End Date Taking? Authorizing Provider  atorvastatin (LIPITOR) 10 MG tablet Take 10 mg by mouth every morning.    Yes Historical Provider, MD  divalproex  (DEPAKOTE ER) 500 MG 24 hr tablet Take 1,000 mg by mouth 2 (two) times daily.    Yes Historical Provider, MD  fentaNYL (DURAGESIC - DOSED MCG/HR) 75 MCG/HR Place 75 mcg onto the skin every 3 (three) days.   Yes Historical Provider, MD  folic acid (FOLVITE) 1 MG tablet Take 1 mg by mouth daily.    Yes Historical Provider, MD  gabapentin (NEURONTIN) 600 MG tablet Take 1,200 mg by mouth 2 (two) times daily.    Yes Historical Provider, MD  hydroxychloroquine (PLAQUENIL) 200 MG tablet Take 200 mg by mouth daily.   Yes Historical Provider, MD  Insulin Lispro Prot & Lispro (HUMALOG MIX 75/25 KWIKPEN) (75-25) 100 UNIT/ML Kwikpen Inject 160 units at breakfast and 60 units at supper. 03/02/15  Yes Renato Shin, MD  lamoTRIgine (LAMICTAL) 100 MG tablet Take 2 tablets (200 mg total) by mouth 2 (two) times daily. Please call [phone] to schedule yearly appt. 04/26/16  Yes Marcial Pacas, MD  levothyroxine (SYNTHROID, LEVOTHROID) 25 MCG tablet Take 25 mcg by  mouth daily before breakfast.    Yes Historical Provider, MD  Methotrexate, PF, (RASUVO) 25 MG/0.5ML SOAJ Inject 25 mg into the skin every 7 (seven) days.   Yes Historical Provider, MD  Naloxegol Oxalate (MOVANTIK PO) Take 25 mg by mouth daily.   Yes Historical Provider, MD  omeprazole (PRILOSEC) 20 MG capsule Take 20 mg by mouth every morning.    Yes Historical Provider, MD  oxyCODONE-acetaminophen (PERCOCET/ROXICET) 5-325 MG tablet Take by mouth every 4 (four) hours as needed for severe pain.   Yes Historical Provider, MD  pregabalin (LYRICA) 100 MG capsule Take 100-200 mg by mouth 2 (two) times daily. Pt takes one capsule in the am and two at night.   Yes Historical Provider, MD  tadalafil (CIALIS) 20 MG tablet Take 20 mg by mouth daily as needed for erectile dysfunction.   Yes Historical Provider, MD  amoxicillin-clavulanate (AUGMENTIN) 500-125 MG tablet Take 1 tablet (500 mg total) by mouth 2 (two) times daily. Patient not taking: Reported on 05/05/2016 02/21/16    Kathie Rhodes, MD  HUMALOG MIX 75/25 KWIKPEN (75-25) 100 UNIT/ML Kwikpen INJECT 150 UNITS AT BREAKFAST AND 70 UNITS AT SUPPER. **APPOINTMENT NEEDED FOR FURTHER REFILLS Patient not taking: Reported on 05/05/2016 03/14/16   Renato Shin, MD  Oxycodone HCl 10 MG TABS Take 1 tablet (10 mg total) by mouth every 4 (four) hours as needed. Patient not taking: Reported on 05/05/2016 02/21/16   Kathie Rhodes, MD    Family History Family History  Problem Relation Age of Onset  . Diabetes Mother   . Heart attack Mother   . Diabetes Father   . Hypertension Father   . Heart attack Father     Social History Social History  Substance Use Topics  . Smoking status: Current Every Day Smoker    Packs/day: 2.00    Years: 25.00    Types: Cigarettes  . Smokeless tobacco: Never Used  . Alcohol use Yes     Comment: RARE     Allergies   Celebrex [celecoxib]; Hydrocodone; and Sulfa antibiotics   Review of Systems Review of Systems  Constitutional: Negative for chills and fever.  HENT: Negative for congestion.   Eyes: Negative for visual disturbance.  Respiratory: Negative for shortness of breath.   Cardiovascular: Negative for chest pain.  Gastrointestinal: Negative for abdominal pain, nausea and vomiting.  Musculoskeletal: Negative for back pain and neck pain.  Skin: Negative for rash.  Neurological: Negative for headaches.  Psychiatric/Behavioral: Negative for suicidal ideas.     Physical Exam Updated Vital Signs BP 136/68 (BP Location: Left Arm)   Pulse 70   Temp 98.2 F (36.8 C) (Oral)   Resp 16   Ht 6' (1.829 m)   Wt 105.7 kg   SpO2 96%   BMI 31.60 kg/m   Physical Exam  Constitutional: He is oriented to person, place, and time. He appears well-developed and well-nourished.  Pleasant, cooperative male in no acute distress.   HENT:  Head: Normocephalic and atraumatic.  Cardiovascular: Normal rate, regular rhythm and normal heart sounds.   No murmur heard. Pulmonary/Chest:  Effort normal and breath sounds normal. No respiratory distress.  Abdominal: Soft. He exhibits no distension. There is no tenderness.  Musculoskeletal: He exhibits no edema.  Neurological: He is alert and oriented to person, place, and time.  Skin: Skin is warm and dry.  Nursing note and vitals reviewed.    ED Treatments / Results  Labs (all labs ordered are listed, but only abnormal results  are displayed) Labs Reviewed  CBC WITH DIFFERENTIAL/PLATELET - Abnormal; Notable for the following:       Result Value   MCHC 36.3 (*)    All other components within normal limits  BASIC METABOLIC PANEL - Abnormal; Notable for the following:    Glucose, Bld 287 (*)    All other components within normal limits  ETHANOL  RAPID URINE DRUG SCREEN, HOSP PERFORMED    EKG  EKG Interpretation None       Radiology No results found.  Procedures Procedures (including critical care time)  Medications Ordered in ED Medications  atorvastatin (LIPITOR) tablet 10 mg (not administered)  divalproex (DEPAKOTE ER) 24 hr tablet 1,000 mg (not administered)  folic acid (FOLVITE) tablet 1 mg (not administered)  gabapentin (NEURONTIN) capsule 1,200 mg (not administered)  hydroxychloroquine (PLAQUENIL) tablet 200 mg (not administered)  lamoTRIgine (LAMICTAL) tablet 200 mg (not administered)  levothyroxine (SYNTHROID, LEVOTHROID) tablet 25 mcg (not administered)  pantoprazole (PROTONIX) EC tablet 40 mg (not administered)  pregabalin (LYRICA) capsule 100-200 mg (not administered)  insulin aspart protamine- aspart (NOVOLOG MIX 70/30) injection 60 Units (60 Units Subcutaneous Given 05/05/16 1841)     Initial Impression / Assessment and Plan / ED Course  I have reviewed the triage vital signs and the nursing notes.  Pertinent labs & imaging results that were available during my care of the patient were reviewed by me and considered in my medical decision making (see chart for details).    Vincent [name] is a 51 y.o. male who presents to ED from behavioral health under IVC for medical clearance. Patient has no complaints at this time. He denies suicidal or homicidal ideations during my evaluation. Lab work reviewed and reassuring. He does have a glucose of 287. He has a history of diabetes on insulin. He appears well-hydrated with normal CO2, BUN/creatinine and anion gap. Will continue diabetes medications and have patient follow up with PCP as outpatient for further discussion of elevated glucose. Medically cleared with disposition per psychiatry recommendations.    Final Clinical Impressions(s) / ED Diagnoses   Final diagnoses:  None    New Prescriptions New Prescriptions   No medications on file     [facility] Aaliah Jorgenson, PA-C 05/05/16 1856    Daleen Bo, MD 05/07/16 [phone]

## 2016-05-05 NOTE — Discharge Instructions (Signed)
Please follow up with your primary care provider for discussion of your sugar levels.

## 2016-05-05 NOTE — ED Notes (Signed)
Pt decided to not wear boots d/t having to remove laces.  Spouse will take all items home.
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
## 2016-05-06 DIAGNOSIS — F3181 Bipolar II disorder: Principal | ICD-10-CM

## 2016-05-06 LAB — URINE DRUG SCREEN, QUALITATIVE (ARMC ONLY)
Amphetamines, Ur Screen: NOT DETECTED
Barbiturates, Ur Screen: NOT DETECTED
Benzodiazepine, Ur Scrn: NOT DETECTED
Cannabinoid 50 Ng, Ur ~~LOC~~: NOT DETECTED
Cocaine Metabolite,Ur ~~LOC~~: NOT DETECTED
MDMA (Ecstasy)Ur Screen: NOT DETECTED
Methadone Scn, Ur: NOT DETECTED
Opiate, Ur Screen: NOT DETECTED
Phencyclidine (PCP) Ur S: NOT DETECTED
Tricyclic, Ur Screen: NOT DETECTED

## 2016-05-06 LAB — LIPID PANEL
Cholesterol: 277 mg/dL — ABNORMAL HIGH (ref 0–200)
HDL: 34 mg/dL — ABNORMAL LOW (ref >40)
LDL Cholesterol: UNDETERMINED mg/dL (ref 0–99)
Total CHOL/HDL Ratio: 8.1 RATIO
Triglycerides: 431 mg/dL — ABNORMAL HIGH (ref <150)
VLDL: UNDETERMINED mg/dL (ref 0–40)

## 2016-05-06 LAB — GLUCOSE, CAPILLARY
Glucose-Capillary: 161 mg/dL — ABNORMAL HIGH (ref 65–99)
Glucose-Capillary: 40 mg/dL — CL (ref 65–99)
Glucose-Capillary: 80 mg/dL (ref 65–99)
Glucose-Capillary: 82 mg/dL (ref 65–99)

## 2016-05-06 LAB — TSH: TSH: 3.735 u[IU]/mL (ref 0.350–4.500)

## 2016-05-06 LAB — VALPROIC ACID LEVEL: Valproic Acid Lvl: 17 ug/mL — ABNORMAL LOW (ref 50.0–100.0)

## 2016-05-06 MED ORDER — INSULIN ASPART 100 UNIT/ML ~~LOC~~ SOLN
0.0000 [IU] | Freq: Three times a day (TID) | SUBCUTANEOUS | Status: DC
  Administered 2016-05-06: 2 [IU] via SUBCUTANEOUS
  Filled 2016-05-06: qty 2

## 2016-05-06 MED ORDER — LAMOTRIGINE 25 MG PO TABS: 200.0000 mg | ORAL_TABLET | Freq: Two times a day (BID) | ORAL | Status: DC

## 2016-05-06 MED ORDER — INSULIN ASPART PROT & ASPART (70-30 MIX) 100 UNIT/ML ~~LOC~~ SUSP: 50.0000 [IU] | Freq: Once | SUBCUTANEOUS | Status: DC

## 2016-05-06 MED ORDER — ATORVASTATIN CALCIUM 20 MG PO TABS: 40.0000 mg | ORAL_TABLET | Freq: Every day | ORAL | Status: DC

## 2016-05-06 MED ORDER — FENOFIBRATE 160 MG PO TABS: 160.0000 mg | ORAL_TABLET | Freq: Every day | ORAL | Status: DC

## 2016-05-06 MED ORDER — INSULIN ASPART PROT & ASPART (70-30 MIX) 100 UNIT/ML ~~LOC~~ SUSP: 140.0000 [IU] | Freq: Every day | SUBCUTANEOUS | Status: DC

## 2016-05-06 MED ORDER — INSULIN ASPART PROT & ASPART (70-30 MIX) 100 UNIT/ML ~~LOC~~ SUSP: 25.0000 [IU] | Freq: Two times a day (BID) | SUBCUTANEOUS | 11 refills | Status: DC

## 2016-05-06 MED ORDER — FENOFIBRATE 160 MG PO TABS: 160.0000 mg | ORAL_TABLET | Freq: Every day | ORAL | 1 refills | Status: DC

## 2016-05-06 MED ORDER — INSULIN ASPART PROT & ASPART (70-30 MIX) 100 UNIT/ML ~~LOC~~ SUSP: 25.0000 [IU] | Freq: Two times a day (BID) | SUBCUTANEOUS | Status: DC

## 2016-05-06 MED ORDER — INSULIN ASPART PROT & ASPART (70-30 MIX) 100 UNIT/ML ~~LOC~~ SUSP: 40.0000 [IU] | Freq: Every day | SUBCUTANEOUS | Status: DC

## 2016-05-06 MED ORDER — INSULIN ASPART PROT & ASPART (70-30 MIX) 100 UNIT/ML ~~LOC~~ SUSP: 100.0000 [IU] | Freq: Every day | SUBCUTANEOUS | Status: DC

## 2016-05-06 MED ORDER — INSULIN ASPART PROT & ASPART (70-30 MIX) 100 UNIT/ML ~~LOC~~ SUSP: 100.0000 [IU] | Freq: Once | SUBCUTANEOUS | Status: DC

## 2016-05-06 MED ORDER — ATORVASTATIN CALCIUM 40 MG PO TABS: 40.0000 mg | ORAL_TABLET | Freq: Every day | ORAL | 1 refills | Status: DC

## 2016-05-06 MED ORDER — PREGABALIN 75 MG PO CAPS: 200.0000 mg | ORAL_CAPSULE | Freq: Every day | ORAL | Status: DC

## 2016-05-06 MED ORDER — INSULIN ASPART PROT & ASPART (70-30 MIX) 100 UNIT/ML ~~LOC~~ SUSP: 50.0000 [IU] | Freq: Every day | SUBCUTANEOUS | Status: DC

## 2016-05-06 MED ORDER — PREGABALIN 50 MG PO CAPS: 100.0000 mg | ORAL_CAPSULE | Freq: Every day | ORAL | Status: DC

## 2016-05-06 MED ORDER — DIVALPROEX SODIUM ER 500 MG PO TB24: 1000.0000 mg | ORAL_TABLET | Freq: Two times a day (BID) | ORAL | Status: DC

## 2016-05-06 MED ORDER — NICOTINE 21 MG/24HR TD PT24
21.0000 mg | MEDICATED_PATCH | Freq: Every day | TRANSDERMAL | Status: DC
  Administered 2016-05-06: 21 mg via TRANSDERMAL
  Filled 2016-05-06: qty 1

## 2016-05-06 MED ORDER — FENTANYL 75 MCG/HR TD PT72: 75.0000 ug | MEDICATED_PATCH | TRANSDERMAL | Status: DC

## 2016-05-06 MED ORDER — GABAPENTIN 300 MG PO CAPS: 1200.0000 mg | ORAL_CAPSULE | Freq: Two times a day (BID) | ORAL | Status: DC

## 2016-05-06 NOTE — Progress Notes (Signed)
Patient ID: Vincent [name], male   DOB: [birth year], 51 y.o.   MRN: [id number]  Pt was given 60 mg of 70/30 insulin last night and his BG dropped to 40 units. He was not given insulin this morning. Spoke with Dr. Posey Pronto who will see tha patient. Received call from the pharmacist to give 50 units of 70/30 now.

## 2016-05-06 NOTE — Progress Notes (Signed)
D: Patient is aware of  Discharge this shift .Patient denies suicidal /homicidal ideations. Patient received all belongings brought in  A: No Storage medications. Writer reviewed Discharge Summary, Suicide Risk Assessment, and Transitional Record. Patient did not  received Prescriptions   A: Writer instructed on discharge criteria  . Marland Kitchen Aware  Of follow up appointment . R: Patient left unit with no questions  Or concerns  With wife.

## 2016-05-06 NOTE — BHH Suicide Risk Assessment (Addendum)
[facility] Admission Suicide Risk Assessment   Nursing information obtained from:    Demographic factors:    Current Mental Status:    Loss Factors:    Historical Factors:    Risk Reduction Factors:     Total Time spent with patient: 1 hour Principal Problem: Bipolar 2 disorder ([place]) Diagnosis:   Patient Active Problem List   Diagnosis Date Noted  . Tobacco use disorder [F17.200] 05/05/2016  . Insomnia secondary to chronic pain [G89.29, G47.01] 06/02/2015  . Altered mental status [R41.82] 05/28/2015  . Acute bronchitis [J20.9] 05/28/2015  . Type 2 diabetes mellitus with hyperglycemia ([place]) [E11.65] 05/28/2015  . Hypothyroidism [E03.9] 05/28/2015  . Bipolar 2 disorder ([place]) [F31.81] 05/28/2015  . History of rheumatoid arthritis [Z87.39] 05/28/2015  . Chronic pain [G89.29] 05/28/2015  . Depression [F32.9] 03/18/2015  . Obstructive sleep apnea [G47.33] 03/18/2015  . Diabetes ([place]) [E11.9] 01/23/2015  . Obesity (BMI 30-39.9) [E66.9] 03/06/2014  . Acute encephalopathy [G93.40] 03/05/2014  . HLD (hyperlipidemia) [E78.5] 03/05/2014  . Anemia, normocytic normochromic [D64.9] 03/05/2014  . Altered mental state [R41.82]   . Protein-calorie malnutrition, severe ([place]) [E43] 10/09/2012  . Bladder tumor [D49.4] 08/08/2012  . Biliary dyskinesia [K82.8] 11/02/2010   Subjective Data: depression.  Continued Clinical Symptoms:  Alcohol Use Disorder Identification Test Final Score (AUDIT): 1 The "Alcohol Use Disorders Identification Test", Guidelines for Use in Primary Care, Second Edition.  World Pharmacologist [facility]). Score between 0-7:  no or low risk or alcohol related problems. Score between 8-15:  moderate risk of alcohol related problems. Score between 16-19:  high risk of alcohol related problems. Score 20 or above:  warrants further diagnostic evaluation for alcohol dependence and treatment.   CLINICAL FACTORS:   Bipolar Disorder:   Bipolar II Chronic Pain Medical Diagnoses and  Treatments/Surgeries   Musculoskeletal: Strength & Muscle Tone: within normal limits Gait & Station: normal Patient leans: N/A  Psychiatric Specialty Exam: Physical Exam  Nursing note and vitals reviewed. Psychiatric: He has a normal mood and affect. His speech is normal and behavior is normal. Judgment and thought content normal. Cognition and memory are normal.    Review of Systems  All other systems reviewed and are negative.   Blood pressure 134/70, pulse 67, temperature 97.8 F (36.6 C), temperature source Oral, resp. rate 18, height 6' (1.829 m), weight 105.7 kg (233 lb), SpO2 100 %.Body mass index is 31.6 kg/m.  General Appearance: Casual  Eye Contact:  Good  Speech:  Clear and Coherent  Volume:  Normal  Mood:  Euthymic  Affect:  Appropriate  Thought Process:  Goal Directed and Descriptions of Associations: Intact  Orientation:  Full (Time, Place, and Person)  Thought Content:  WDL  Suicidal Thoughts:  No  Homicidal Thoughts:  No  Memory:  Immediate;   Fair Recent;   Fair Remote;   Fair  Judgement:  Fair  Insight:  Fair and Shallow  Psychomotor Activity:  Normal  Concentration:  Concentration: Fair and Attention Span: Fair  Recall:  [employer] of Knowledge:  Fair  Language:  Fair  Akathisia:  No  Handed:  Right  AIMS (if indicated):     Assets:  Communication Skills Desire for Improvement Financial Resources/Insurance Housing Intimacy Resilience Social Support Transportation  ADL's:  Intact  Cognition:  WNL  Sleep:  Number of Hours: 5.3      COGNITIVE FEATURES THAT CONTRIBUTE TO RISK:  None    SUICIDE RISK:   Minimal: No identifiable suicidal ideation.  Patients  presenting with no risk factors but with morbid ruminations; may be classified as minimal risk based on the severity of the depressive symptoms  PLAN OF CARE: Hospital admission, medication management, discharge planning.  Vincent [name] is a 51 year old male with a history of bipolar  disorder and chronic pain admitted for worsening of depression and suicidal ideation with a plan to shoot himself. Reportedly his guns were locked away by his mother-in-law.  1. Suicidal ideation. The patient adamantly denies any thoughts, intention or plans to hurt himself or others. He is able to contract for safety. He is forward thinking and optimistic about the future. He is a loving husband, father, and son.  2. Mood. We continued Depakote for mood stabilization but not Lamictal due to interaction with Depakote.  3. Diabetes. Medicine consult is greatly appreciated. The patient had an episode of hypoglycemia last night. His insulin dose was substantially lowered. He is on ADA diet, SSI, and blood glucose monitoring.  4. Dyslipidemia. He is on Lipitor and now Fenofibrate.   5. Chronic pain. He is on fentanyl patch, Neurontin and Lyrica.  6. Hypothyroidism. He is on Synthroid.  7. Constipation. He is on Movantik.  8. Smoking. Nicotine patch is available.  9. GERD. He is on Protonix.  10. Metabolic syndrome monitoring. Lipid panel is elevated, her hemoglobin A1c is pending.  11. Disposition. The patient will be discharged home with his family. He will follow up with his primary care provider for medication management, [facility] for psychotherapy, and his endocrinologist for diabetes management.     I certify that inpatient services furnished can reasonably be expected to improve the patient's condition.   Orson Slick, MD 05/06/2016, 12:43 PM

## 2016-05-06 NOTE — Consult Note (Signed)
[facility] [place] NAME: Vincent [name]    MR#:  [id number]  DATE OF BIRTH:  February 15, 1966  DATE OF ADMISSION:  05/05/2016  PRIMARY CARE PHYSICIAN: Leonard Downing, MD   REQUESTING/REFERRING PHYSICIAN: Dr. Orson Slick  CHIEF COMPLAINT:  No chief complaint on file.   HISTORY OF PRESENT ILLNESS:  Vincent [name]  is a 51 y.o. male with a known history of Insulin-dependent diabetes mellitus, bipolar disorder, chronic back pain and pain syndrome, COPD not on oxygen, ongoing smoking and arthritis presents to hospital secondary to depression symptoms. Patient went to [facility] for feelings of depression and requesting a counseling due to problems with his wife. However he was placed under involuntary commitment for his depression symptoms and access to weapons. He was transferred to [facility] due to that availability here. Patient denies any suicidal or homicidal symptoms. He is in a great mood and is interacting well. Medical consult has been requested for diabetes management. Apparently patient takes 75/25 insulin at home, at very high doses. He states he takes up to 120 to 180 units of insulin in the morning based on his sugars and also up to 60 to 140 units in the evening. Not really sure if he has been compliant and has been taking the insulin like he was supposed to. He says he sees an endocrinologist in [place] and is supposed to follow-up sooner. He got up to 60 units of 70/30 insulin here last evening around 6 PM. His blood sugars dropped to 40s last night and are in the 80's now. He says he hasn't eaten anything all day in the emergency room yesterday until last night he came here to [facility]. He says he does recognize the signs of hypoglycemia as he occasionally has hypoglycemic episodes at home. His A1c is pending at this time.  PAST MEDICAL HISTORY:   Past Medical History:  Diagnosis Date   . Anxiety   . Arthritis   . Bipolar 1 disorder ([place])   . Chronic pain syndrome    back  . COPD (chronic obstructive pulmonary disease) ([place])   . Gastroparesis   . GERD (gastroesophageal reflux disease)   . Hiatal hernia   . History of bladder cancer urologist-  dr Consuella Lose   papillay TCC (Ta G1)  s/p TURBT and chemo instillation 2014  . History of encephalopathy 05/27/2015   admission w/ acute encephalopathy thought to be secondary to pain meds and COPD  . History of gastric ulcer   . History of kidney stones   . History of TIA (transient ischemic attack) 2008    no residual  . History of traumatic head injury 2010   w/ LOC  per pt needed stitches  . Hyperlipidemia   . Hypothyroidism   . Insomnia    per sleep study 04-19-2015 without sleep apnea  . Neuropathy in diabetes ([place])    LOWER EXTREMITIES  . Poor historian   . Seizures, transient [facility]) neurologist-  dr Krista Blue--  differential dx complex partial seizure .vs.  mood disorder .vs.  pseudoseizure--  negative EEG's   confusion episodes and staring spells since 11/ 2015  . Transient confusion NEUOROLOGIST-  DR YAN   Episodes since 11/ 2015--  neurologist dx  differential complex partial seizure  .vs. mood disorder . vs. pseudoseizure  . Type 2 diabetes mellitus treated with insulin ([place])   . Urinary hesitancy     PAST SURGICAL HISTOIRY:   Past  Surgical History:  Procedure Laterality Date  . CARDIAC CATHETERIZATION  12-27-2001  DR Einar Gip  &  05-26-2009  DR Irish Lack   RESULTS FOR BOTH ARE NORMAL CORONARIES AND PERSERVED LVF/ EF 60%  . CARPAL TUNNEL RELEASE Right 09-16-2003  . CARPAL TUNNEL RELEASE Left 02/25/2015   Procedure: LEFT CARPAL TUNNEL RELEASE;  Surgeon: Leanora Cover, MD;  Location: [facility];  Service: Orthopedics;  Laterality: Left;  . CYSTOSCOPY N/A 10/10/2012   Procedure: CYSTOSCOPY CLOT EVACUATION FULGERATION OF BLEEDERS ;  Surgeon: Claybon Jabs, MD;  Location: [facility];   Service: Urology;  Laterality: N/A;  . CYSTOSCOPY WITH BIOPSY N/A 11/26/2015   Procedure: CYSTOSCOPY WITH BIOPSY AND FULGURATION;  Surgeon: Kathie Rhodes, MD;  Location: [place];  Service: Urology;  Laterality: N/A;  . LAPAROSCOPIC CHOLECYSTECTOMY  11-17-2010  . NEGATIVE SLEEP STUDY  04-19-2015  in epic  . ORCHIECTOMY Right 02/21/2016   Procedure: SCROTAL ORCHIECTOMY with TESTICULAR PROSTHESIS IMPLANT;  Surgeon: Kathie Rhodes, MD;  Location: [facility];  Service: Urology;  Laterality: Right;  . ROTATOR CUFF REPAIR Right 12/2004  . TRANSURETHRAL RESECTION OF BLADDER TUMOR N/A 08/09/2012   Procedure: TRANSURETHRAL RESECTION OF BLADDER TUMOR (TURBT) WITH GYRUS WITH MITOMYCIN C;  Surgeon: Claybon Jabs, MD;  Location: [facility];  Service: Urology;  Laterality: N/A;  . TRANSURETHRAL RESECTION OF BLADDER TUMOR WITH GYRUS (TURBT-GYRUS) N/A 02/27/2014   Procedure: TRANSURETHRAL RESECTION OF BLADDER TUMOR WITH GYRUS (TURBT-GYRUS);  Surgeon: Claybon Jabs, MD;  Location: [facility];  Service: Urology;  Laterality: N/A;    SOCIAL HISTORY:   Social History  Substance Use Topics  . Smoking status: Current Every Day Smoker    Packs/day: 2.00    Years: 25.00    Types: Cigarettes  . Smokeless tobacco: Never Used  . Alcohol use Yes     Comment: RARE    FAMILY HISTORY:   Family History  Problem Relation Age of Onset  . Diabetes Mother   . Heart attack Mother   . Diabetes Father   . Hypertension Father   . Heart attack Father     DRUG ALLERGIES:   Allergies  Allergen Reactions  . Celebrex [Celecoxib] Anaphylaxis and Rash  . Hydrocodone Rash and Other (See Comments)    "blisters developed on arms"  . Sulfa Antibiotics Rash    REVIEW OF SYSTEMS:   Review of Systems  Constitutional: Negative for chills, fever and malaise/fatigue.  HENT: Negative for ear discharge, hearing loss and nosebleeds.   Eyes: Negative for blurred  vision, double vision and photophobia.  Respiratory: Negative for cough, hemoptysis, shortness of breath and wheezing.   Cardiovascular: Negative for chest pain, palpitations, orthopnea and leg swelling.  Gastrointestinal: Negative for abdominal pain, constipation, diarrhea, heartburn, melena, nausea and vomiting.  Genitourinary: Negative for dysuria, frequency, hematuria and urgency.  Musculoskeletal: Positive for myalgias and neck pain. Negative for back pain.  Skin: Negative for rash.  Neurological: Negative for dizziness, sensory change, speech change, focal weakness and headaches.  Endo/Heme/Allergies: Does not bruise/bleed easily.  Psychiatric/Behavioral: Negative for depression.     MEDICATIONS AT HOME:   Prior to Admission medications   Medication Sig Start Date End Date Taking? Authorizing Provider  amoxicillin-clavulanate (AUGMENTIN) 500-125 MG tablet Take 1 tablet (500 mg total) by mouth 2 (two) times daily. Patient not taking: Reported on 05/05/2016 02/21/16   Kathie Rhodes, MD  atorvastatin (LIPITOR) 10 MG tablet Take 10 mg by mouth  every morning.     Historical Provider, MD  divalproex (DEPAKOTE ER) 500 MG 24 hr tablet Take 1,000 mg by mouth 2 (two) times daily.     Historical Provider, MD  fentaNYL (DURAGESIC - DOSED MCG/HR) 75 MCG/HR Place 75 mcg onto the skin every 3 (three) days.    Historical Provider, MD  folic acid (FOLVITE) 1 MG tablet Take 1 mg by mouth daily.     Historical Provider, MD  gabapentin (NEURONTIN) 600 MG tablet Take 1,200 mg by mouth 2 (two) times daily.     Historical Provider, MD  HUMALOG MIX 75/25 KWIKPEN (75-25) 100 UNIT/ML Kwikpen INJECT 150 UNITS AT BREAKFAST AND 70 UNITS AT SUPPER. **APPOINTMENT NEEDED FOR FURTHER REFILLS Patient not taking: Reported on 05/05/2016 03/14/16   Renato Shin, MD  hydroxychloroquine (PLAQUENIL) 200 MG tablet Take 200 mg by mouth daily.    Historical Provider, MD  Insulin Lispro Prot & Lispro (HUMALOG MIX 75/25 KWIKPEN)  (75-25) 100 UNIT/ML Kwikpen Inject 160 units at breakfast and 60 units at supper. 03/02/15   Renato Shin, MD  lamoTRIgine (LAMICTAL) 100 MG tablet Take 2 tablets (200 mg total) by mouth 2 (two) times daily. Please call [phone] to schedule yearly appt. 04/26/16   Marcial Pacas, MD  levothyroxine (SYNTHROID, LEVOTHROID) 25 MCG tablet Take 25 mcg by mouth daily before breakfast.     Historical Provider, MD  Methotrexate, PF, (RASUVO) 25 MG/0.5ML SOAJ Inject 25 mg into the skin every 7 (seven) days.    Historical Provider, MD  Naloxegol Oxalate (MOVANTIK PO) Take 25 mg by mouth daily.    Historical Provider, MD  omeprazole (PRILOSEC) 20 MG capsule Take 20 mg by mouth every morning.     Historical Provider, MD  Oxycodone HCl 10 MG TABS Take 1 tablet (10 mg total) by mouth every 4 (four) hours as needed. Patient not taking: Reported on 05/05/2016 02/21/16   Kathie Rhodes, MD  oxyCODONE-acetaminophen (PERCOCET/ROXICET) 5-325 MG tablet Take by mouth every 4 (four) hours as needed for severe pain.    Historical Provider, MD  pregabalin (LYRICA) 100 MG capsule Take 100-200 mg by mouth 2 (two) times daily. Pt takes one capsule in the am and two at night.    Historical Provider, MD  tadalafil (CIALIS) 20 MG tablet Take 20 mg by mouth daily as needed for erectile dysfunction.    Historical Provider, MD      VITAL SIGNS:  Blood pressure 134/70, pulse 67, temperature 97.8 F (36.6 C), temperature source Oral, resp. rate 18, height 6' (1.829 m), weight 105.7 kg (233 lb), SpO2 100 %.  PHYSICAL EXAMINATION:   Physical Exam  GENERAL:  51 y.o.-year-old patient sitting in the bed with no acute distress.  EYES: Pupils equal, round, reactive to light and accommodation. No scleral icterus. Extraocular muscles intact.  HEENT: Head atraumatic, normocephalic. Oropharynx and nasopharynx clear.  NECK:  Supple, no jugular venous distention. No thyroid enlargement, no tenderness.  LUNGS: Normal breath sounds bilaterally, no  wheezing, rales,rhonchi or crepitation. No use of accessory muscles of respiration.  CARDIOVASCULAR: S1, S2 normal. No murmurs, rubs, or gallops.  ABDOMEN: Soft, nontender, nondistended. Bowel sounds present. No organomegaly or mass.  EXTREMITIES: No pedal edema, cyanosis, or clubbing.  NEUROLOGIC: Cranial nerves II through XII are intact. Muscle strength 5/5 in all extremities. Sensation intact. Gait not checked.  PSYCHIATRIC: The patient is alert and oriented x 3. Does not appear to be depressed, good mood and interacting well. SKIN: No obvious rash, lesion. Small  Open superficial wounds on left side of neck  LABORATORY PANEL:   CBC  Recent Labs Lab 05/05/16 1700  WBC 6.8  HGB 14.8  HCT 40.8  PLT 210   ------------------------------------------------------------------------------------------------------------------  Chemistries   Recent Labs Lab 05/05/16 1700  NA 137  K 4.1  CL 105  CO2 24  GLUCOSE 287*  BUN 18  CREATININE 0.89  CALCIUM 9.2   ------------------------------------------------------------------------------------------------------------------  Cardiac Enzymes No results for input(s): TROPONINI in the last 168 hours. ------------------------------------------------------------------------------------------------------------------  RADIOLOGY:  No results found.  EKG:   Orders placed or performed during the hospital encounter of 02/21/16  . EKG 12 lead  . EKG 12 lead  . EKG 12-Lead  . EKG 12-Lead    IMPRESSION AND PLAN:   Vincent [name]  is a 51 y.o. male with a known history of Insulin-dependent diabetes mellitus, bipolar disorder, chronic back pain and pain syndrome, COPD not on oxygen, ongoing smoking and arthritis presents to hospital secondary to depression symptoms.  #1 Type 2 diabetes mellitus with hypoglycemic episodes-takes high amounts of 75/25 insulin at home. -received 60 units of 70/30 last evening and poor oral intake yesterday-  sugars dropped to 40's. - hold AM dose of insulin, continue sliding scale - a1c is pending - decrease the 70/30 to 25 units BID for now  - needs outpatient endocrinology f/u immediately after discharge to adjust his home insulin regimen  #2 Hyperlipidemia- increase statin dose and added tricor for elevated triglycerides  #3 Tobacco use disorder- counseled, added nicotine patch  #4 Chronic pain syndrome- follows with pain management, on fentanyl patch and lyrica  #5 Bipolar with depression- stable, management per psychiatry  #6 DVT Prophylaxis- ambulatory    All the records are reviewed and case discussed with Consulting provider. Management plans discussed with the patient, family and they are in agreement.  CODE STATUS: Full code  TOTAL TIME TAKING CARE OF THIS PATIENT: 50 minutes.    Gladstone Lighter M.D on 05/06/2016 at 11:26 AM  Between 7am to 6pm - Pager - [phone]  After 6pm go to [email or web address] - password [email or web address] [facility]  Office  [phone]  CC: Primary care Physician: Leonard Downing, MD

## 2016-05-06 NOTE — Discharge Summary (Signed)
Physician Discharge Summary Note  Patient:  Vincent [name] is an 51 y.o., male MRN:  [id number] DOB:  Apr 01, 1965 Patient phone:  [phone] (home)  Patient address:   [place] [address],  Total Time spent with patient: 1 hour  Date of Admission:  05/05/2016 Date of Discharge: 05/06/2016  Reason for Admission:  Suicidal ideation.  Identifying data. Vincent [name] is a 51 year old male with history of bipolar disorder and chronic pain.  Chief complaint. "This is all one misunderstanding."  History of present illness. Information was obtained from the patient and the chart. The patient reports that yesterday he went to [facility] behavioral health outpatient clinic to make arrangements for psychotherapy appointments. He has been having some difficulties in his marriage and wanted to learn better coping skills. During the initial evaluation the patient was misunderstood and was placed under involuntary commitment, transferred to [facility]: Emergency room, then to [facility] for presumed suicidal ideation. The patient adamantly denies any thoughts, intentions, or plans to hurt himself or others. This was stated during his emergency room visit yesterday as well. The patient has never attempted suicide or has been hospitalized in a mental institution. He is mildly bipolar disorder has been managed by his primary care provider for years. He was doing well on lithium. He was switched to Depakote after he developed tremors from lithium. The patient reports that he his father-in-law and his son all are gun owners and frequently go to a shooting ranch. The patient does not have an gun safe and keep his guns and keeps his guns at his in-laws house where they are safely locked away. Somehow the fact that he did not his gun to his in-laws was taken out of context and as an indication that the patient felt unsafe thinking about suicide. The patient denies any symptoms of  depression, anxiety, or psychosis. He reports that he gets easily irritated and agitated. When he feels this way he usually drives his truck around not being around other family members and not to upset them. He decided that maybe it was time to learn better coping skills and this is why he was seeking therapy. He did go to marriage counseling with his wife few years back. The patient denied alcohol, illicit substance, or prescription pill abuse.  Past psychiatric history. He has been treated for bipolar illness by his primary provider for years. He was on lithium in the past. This was discontinued due to unacceptable tremors. He is now on Depakote. The patient insists that he is also taking Lamictal 200 mg twice a day. This is a dangerous combination with Lamictal and I am not sure if he is correct. He has never been hospitalized in a mental hospital. There were no suicide attempts. He went to marriage counseling at some point.  Family psychiatric history. Nonreported.  Social history. He is disabled from back injury. He lives with his wife of 56 years and his son. He is a Therapist, art. He has a long history of diabetes that he has been managed quite successfully with high doses of insulin. He reports that since January using [employer] point system he lost 22 pounds. She also has better eating habits. The patient experienced severe hypoglycemia last night in the hospital after he was given his regular dose of insulin. He believes that this was due to the fact that he has not eaten all day long yesterday being at the doctor's office and emergency room.  Principal Problem: Bipolar 2 disorder [facility]) Discharge Diagnoses: Patient Active Problem List   Diagnosis Date Noted  . Tobacco use disorder [F17.200] 05/05/2016  . Insomnia secondary to chronic pain [G89.29, G47.01] 06/02/2015  . Altered mental status [R41.82] 05/28/2015  . Acute bronchitis [J20.9] 05/28/2015  . Type 2 diabetes mellitus with  hyperglycemia ([place]) [E11.65] 05/28/2015  . Hypothyroidism [E03.9] 05/28/2015  . Bipolar 2 disorder ([place]) [F31.81] 05/28/2015  . History of rheumatoid arthritis [Z87.39] 05/28/2015  . Chronic pain [G89.29] 05/28/2015  . Depression [F32.9] 03/18/2015  . Obstructive sleep apnea [G47.33] 03/18/2015  . Diabetes ([place]) [E11.9] 01/23/2015  . Obesity (BMI 30-39.9) [E66.9] 03/06/2014  . Acute encephalopathy [G93.40] 03/05/2014  . HLD (hyperlipidemia) [E78.5] 03/05/2014  . Anemia, normocytic normochromic [D64.9] 03/05/2014  . Altered mental state [R41.82]   . Protein-calorie malnutrition, severe ([place]) [E43] 10/09/2012  . Bladder tumor [D49.4] 08/08/2012  . Biliary dyskinesia [K82.8] 11/02/2010   Past Medical History:  Past Medical History:  Diagnosis Date  . Anxiety   . Arthritis   . Bipolar 1 disorder ([place])   . Chronic pain syndrome    back  . COPD (chronic obstructive pulmonary disease) ([place])   . Gastroparesis   . GERD (gastroesophageal reflux disease)   . Hiatal hernia   . History of bladder cancer urologist-  dr Consuella Lose   papillay TCC (Ta G1)  s/p TURBT and chemo instillation 2014  . History of encephalopathy 05/27/2015   admission w/ acute encephalopathy thought to be secondary to pain meds and COPD  . History of gastric ulcer   . History of kidney stones   . History of TIA (transient ischemic attack) 2008    no residual  . History of traumatic head injury 2010   w/ LOC  per pt needed stitches  . Hyperlipidemia   . Hypothyroidism   . Insomnia    per sleep study 04-19-2015 without sleep apnea  . Neuropathy in diabetes ([place])    LOWER EXTREMITIES  . Poor historian   . Seizures, transient [facility]) neurologist-  dr Krista Blue--  differential dx complex partial seizure .vs.  mood disorder .vs.  pseudoseizure--  negative EEG's   confusion episodes and staring spells since 11/ 2015  . Transient confusion NEUOROLOGIST-  DR YAN   Episodes since 11/ 2015--  neurologist dx  differential complex  partial seizure  .vs. mood disorder . vs. pseudoseizure  . Type 2 diabetes mellitus treated with insulin ([place])   . Urinary hesitancy     Past Surgical History:  Procedure Laterality Date  . CARDIAC CATHETERIZATION  12-27-2001  DR Einar Gip  &  05-26-2009  DR Irish Lack   RESULTS FOR BOTH ARE NORMAL CORONARIES AND PERSERVED LVF/ EF 60%  . CARPAL TUNNEL RELEASE Right 09-16-2003  . CARPAL TUNNEL RELEASE Left 02/25/2015   Procedure: LEFT CARPAL TUNNEL RELEASE;  Surgeon: Leanora Cover, MD;  Location: [place];  Service: Orthopedics;  Laterality: Left;  . CYSTOSCOPY N/A 10/10/2012   Procedure: CYSTOSCOPY CLOT EVACUATION FULGERATION OF BLEEDERS ;  Surgeon: Claybon Jabs, MD;  [facility];  Service: Urology;  Laterality: N/A;  . CYSTOSCOPY WITH BIOPSY N/A 11/26/2015   Procedure: CYSTOSCOPY WITH BIOPSY AND FULGURATION;  Surgeon: Kathie Rhodes, MD;  Location: [place];  Service: Urology;  Laterality: N/A;  . LAPAROSCOPIC CHOLECYSTECTOMY  11-17-2010  . NEGATIVE SLEEP STUDY  04-19-2015  in epic  . ORCHIECTOMY Right 02/21/2016   Procedure: SCROTAL ORCHIECTOMY with TESTICULAR PROSTHESIS IMPLANT;  Surgeon: Elta Guadeloupe  Karsten Ro, MD;  Location: [facility];  Service: Urology;  Laterality: Right;  . ROTATOR CUFF REPAIR Right 12/2004  . TRANSURETHRAL RESECTION OF BLADDER TUMOR N/A 08/09/2012   Procedure: TRANSURETHRAL RESECTION OF BLADDER TUMOR (TURBT) WITH GYRUS WITH MITOMYCIN C;  Surgeon: Claybon Jabs, MD;  Location: [facility];  Service: Urology;  Laterality: N/A;  . TRANSURETHRAL RESECTION OF BLADDER TUMOR WITH GYRUS (TURBT-GYRUS) N/A 02/27/2014   Procedure: TRANSURETHRAL RESECTION OF BLADDER TUMOR WITH GYRUS (TURBT-GYRUS);  Surgeon: Claybon Jabs, MD;  Location: [facility];  Service: Urology;  Laterality: N/A;   Family History:  Family History  Problem Relation Age of Onset  . Diabetes Mother   . Heart attack  Mother   . Diabetes Father   . Hypertension Father   . Heart attack Father    Social History:  History  Alcohol Use  . Yes    Comment: RARE     History  Drug Use No    Social History   Social History  . Marital status: Married    Spouse name: N/A  . Number of children: 3  . Years of education: GED   Occupational History  . Warden/ranger of company   Social History Main Topics  . Smoking status: Current Every Day Smoker    Packs/day: 2.00    Years: 25.00    Types: Cigarettes  . Smokeless tobacco: Never Used  . Alcohol use Yes     Comment: RARE  . Drug use: No  . Sexual activity: Not Asked   Other Topics Concern  . None   Social History Narrative   Lives at home with his wife and children.   Left-handed.   3-4 cups caffeine per day.    Hospital Course:    Mr. Ashmead is a 51 year old male with a history of bipolar disorder and chronic pain admitted for worsening of depression and suicidal ideation with a plan to shoot himself. Reportedly his guns were locked away by his mother-in-law.  1. Suicidal ideation. The patient adamantly denies any thoughts, intention or plans to hurt himself or others. He is able to contract for safety. He is forward thinking and optimistic about the future. He is a loving husband, father, and son.  2. Mood. We continued Depakote for mood stabilization. The patient believes that he is also taking Lamictal. We did not continue Lamictal due to interaction with Depakote.  3. Diabetes. Medicine consult is greatly appreciated. The patient had an episode of hypoglycemia last night. His insulin dose was substantially lowered. He is on ADA diet, SSI, and blood glucose monitoring.  4. Dyslipidemia. He is on Lipitor and now Fenofibrate.   5. Chronic pain. He is on fentanyl patch, Neurontin and Lyrica.  6. Hypothyroidism. He is on Synthroid.  7. Constipation. He is on Movantik.  8. Smoking. Nicotine patch is  available.  9. GERD. He is on Protonix.  10. Metabolic syndrome monitoring. Lipid panel is elevated, hemoglobin A1c is pending.  11. Disposition. The patient was discharged to home with his family. He will follow up with his primary care provider for medication management, [place] [facility] for psychotherapy, and endocrinologist for diabetes management.  Physical Findings: AIMS:  , ,  ,  ,    CIWA:    COWS:     Musculoskeletal: Strength & Muscle Tone: within normal limits Gait & Station: normal Patient leans: N/A  Psychiatric Specialty Exam: Physical  Exam  Nursing note and vitals reviewed. Psychiatric: He has a normal mood and affect. His speech is normal and behavior is normal. Judgment and thought content normal. Cognition and memory are normal.    Review of Systems  Musculoskeletal: Positive for back pain.  All other systems reviewed and are negative.   Blood pressure 134/70, pulse 67, temperature 97.8 F (36.6 C), temperature source Oral, resp. rate 18, height 6' (1.829 m), weight 105.7 kg (233 lb), SpO2 100 %.Body mass index is 31.6 kg/m.  General Appearance: Casual  Eye Contact:  Good  Speech:  Clear and Coherent  Volume:  Normal  Mood:  Euthymic  Affect:  Appropriate  Thought Process:  Goal Directed and Descriptions of Associations: Intact  Orientation:  Full (Time, Place, and Person)  Thought Content:  WDL  Suicidal Thoughts:  No  Homicidal Thoughts:  No  Memory:  Immediate;   Fair Recent;   Fair Remote;   Fair  Judgement:  Fair  Insight:  Fair  Psychomotor Activity:  Normal  Concentration:  Concentration: Fair and Attention Span: Fair  Recall:  [employer] of Knowledge:  Fair  Language:  Fair  Akathisia:  No  Handed:  Right  AIMS (if indicated):     Assets:  Communication Skills Desire for Improvement Financial Resources/Insurance Housing Intimacy Resilience Social Support Transportation  ADL's:  Intact  Cognition:  WNL  Sleep:  Number  of Hours: 5.3     Have you used any form of tobacco in the last 30 days? (Cigarettes, Smokeless Tobacco, Cigars, and/or Pipes): Yes  Has this patient used any form of tobacco in the last 30 days? (Cigarettes, Smokeless Tobacco, Cigars, and/or Pipes) Yes, Yes, A prescription for an FDA-approved tobacco cessation medication was offered at discharge and the patient refused  Blood Alcohol level:  Lab Results  Component Value Date   [facility] <5 05/05/2016   ETH <5 [id number]    Metabolic Disorder Labs:  Lab Results  Component Value Date   HGBA1C 8.0 03/02/2015   MPG 166 (H) 03/06/2014   MPG 222 01/21/2007   No results found for: PROLACTIN Lab Results  Component Value Date   CHOL 277 (H) 05/06/2016   TRIG 431 (H) 05/06/2016   HDL 34 (L) 05/06/2016   CHOLHDL 8.1 05/06/2016   VLDL UNABLE TO CALCULATE IF TRIGLYCERIDE OVER 400 mg/dL 05/06/2016   LDLCALC UNABLE TO CALCULATE IF TRIGLYCERIDE OVER 400 mg/dL 05/06/2016   LDLCALC  05/27/2009    UNABLE TO CALCULATE IF TRIGLYCERIDE OVER 400 mg/dL        Total Cholesterol/HDL:CHD Risk Coronary Heart Disease Risk Table                     Men   Women  1/2 Average Risk   3.4   3.3  Average Risk       5.0   4.4  2 X Average Risk   9.6   7.1  3 X Average Risk  23.4   11.0        Use the calculated Patient Ratio above and the CHD Risk Table to determine the patient's CHD Risk.        ATP III CLASSIFICATION (LDL):  <100     mg/dL   Optimal  100-129  mg/dL   Near or Above                    Optimal  130-159  mg/dL   Borderline  160-189  mg/dL   High  >190     mg/dL   Very High    See Psychiatric Specialty Exam and Suicide Risk Assessment completed by Attending Physician prior to discharge.  Discharge destination:  Home  Is patient on multiple antipsychotic therapies at discharge:  No   Has Patient had three or more failed trials of antipsychotic monotherapy by history:  No  Recommended Plan for Multiple Antipsychotic  Therapies: NA  Discharge Instructions    Diet - low sodium heart healthy    Complete by:  As directed    Increase activity slowly    Complete by:  As directed      Allergies as of 05/06/2016      Reactions   Celebrex [celecoxib] Anaphylaxis, Rash   Hydrocodone Rash, Other (See Comments)   "blisters developed on arms"   Sulfa Antibiotics Rash      Medication List    STOP taking these medications   amoxicillin-clavulanate 500-125 MG tablet Commonly known as:  AUGMENTIN   HUMALOG MIX 75/25 KWIKPEN (75-25) 100 UNIT/ML Kwikpen Generic drug:  Insulin Lispro Prot & Lispro   hydroxychloroquine 200 MG tablet Commonly known as:  PLAQUENIL   Insulin Lispro Prot & Lispro (75-25) 100 UNIT/ML Kwikpen Commonly known as:  HUMALOG MIX 75/25 KWIKPEN   lamoTRIgine 100 MG tablet Commonly known as:  LAMICTAL   Oxycodone HCl 10 MG Tabs   oxyCODONE-acetaminophen 5-325 MG tablet Commonly known as:  PERCOCET/ROXICET     TAKE these medications     Indication  atorvastatin 40 MG tablet Commonly known as:  LIPITOR Take 1 tablet (40 mg total) by mouth daily at 6 PM. What changed:  medication strength  how much to take  when to take this  Indication:  High Amount of Fats in the Blood, High Amount of Triglycerides in the Blood   divalproex 500 MG 24 hr tablet Commonly known as:  DEPAKOTE ER Take 1,000 mg by mouth 2 (two) times daily.  Indication:  Mixed Bipolar Affective Disorder   fenofibrate 160 MG tablet Take 1 tablet (160 mg total) by mouth daily. Start taking on:  05/07/2016  Indication:  High Amount of Triglycerides in the Blood   fentaNYL 75 MCG/HR Commonly known as:  DURAGESIC - dosed mcg/hr Place 75 mcg onto the skin every 3 (three) days.  Indication:  Chronic Pain   folic acid 1 MG tablet Commonly known as:  FOLVITE Take 1 mg by mouth daily.  Indication:  Anemia From Inadequate Folic Acid   gabapentin 600 MG tablet Commonly known as:  NEURONTIN Take 1,200 mg by  mouth 2 (two) times daily.  Indication:  Neuropathic Pain   insulin aspart protamine- aspart (70-30) 100 UNIT/ML injection Commonly known as:  NOVOLOG MIX 70/30 Inject 0.25 mLs (25 Units total) into the skin 2 (two) times daily with a meal.  Indication:  Insulin-Dependent Diabetes   levothyroxine 25 MCG tablet Commonly known as:  SYNTHROID, LEVOTHROID Take 25 mcg by mouth daily before breakfast.  Indication:  Underactive Thyroid   MOVANTIK PO Take 25 mg by mouth daily.  Indication:  constipation   omeprazole 20 MG capsule Commonly known as:  PRILOSEC Take 20 mg by mouth every morning.  Indication:  Gastroesophageal Reflux Disease   pregabalin 100 MG capsule Commonly known as:  LYRICA Take 100-200 mg by mouth 2 (two) times daily. Pt takes one capsule in the am and two at night.  Indication:  Neuropathic Pain   RASUVO 25 MG/0.5ML [place] Generic  drug:  Methotrexate (PF) Inject 25 mg into the skin every 7 (seven) days.  Indication:  Rheumatoid Arthritis   tadalafil 20 MG tablet Commonly known as:  CIALIS Take 20 mg by mouth daily as needed for erectile dysfunction.  Indication:  Erectile Dysfunction        Follow-up recommendations:  Activity:  as tolerated. Diet:  low sodium heart healthy ADA diet. Other:  keep follow up appointments.  Comments:    Signed: Orson Slick, MD 05/06/2016, 1:17 PM

## 2016-05-06 NOTE — Progress Notes (Signed)
  [facility] Adult Case Management Discharge Plan :  Will you be returning to the same living situation after discharge:  Yes,  home with wife and daugher.  At discharge, do you have transportation home?: Yes,  wife Do you have the ability to pay for your medications: Yes,  patient has insurance  Release of information consent forms completed and in the chart;  Patient's signature needed at discharge.  Patient to Follow up at: Patient plans to follow-up at [facility] in [facility] for outpatient services including medication management and outpatient therapy. Appointments will be scheduled when agency re-opens on Monday and patient will be informed by phone of is appointments by assigned CSW.   Next level of care provider has access to [place] and Suicide Prevention discussed: Yes,  with patient and his wife  Have you used any form of tobacco in the last 30 days? (Cigarettes, Smokeless Tobacco, Cigars, and/or Pipes): Yes  Has patient been referred to the Quitline?: Patient refused referral  Patient has been referred for addiction treatment: Yes  Kennette Cuthrell G. [place], [place] 05/06/2016, 2:15 PM

## 2016-05-06 NOTE — Progress Notes (Signed)
UA obtained and sent up to lab.

## 2016-05-06 NOTE — Progress Notes (Signed)
Hypoglycemic Event  CBG: 40  Treatment: Pt given graham crackers, peanut butter, OJ and Apple juice  Symptoms: Diaphoresis, trembling,   Follow-up CBG: Time: 0017 CBG Result: 80  Possible Reasons for Event: Pt states he did not eat enough  Comments/MD notified: yes    Amie [place]

## 2016-05-06 NOTE — BHH Suicide Risk Assessment (Signed)
[facility] Discharge Suicide Risk Assessment   Principal Problem: Bipolar 2 disorder [facility]) Discharge Diagnoses:  Patient Active Problem List   Diagnosis Date Noted  . Tobacco use disorder [F17.200] 05/05/2016  . Insomnia secondary to chronic pain [G89.29, G47.01] 06/02/2015  . Altered mental status [R41.82] 05/28/2015  . Acute bronchitis [J20.9] 05/28/2015  . Type 2 diabetes mellitus with hyperglycemia ([place]) [E11.65] 05/28/2015  . Hypothyroidism [E03.9] 05/28/2015  . Bipolar 2 disorder ([place]) [F31.81] 05/28/2015  . History of rheumatoid arthritis [Z87.39] 05/28/2015  . Chronic pain [G89.29] 05/28/2015  . Depression [F32.9] 03/18/2015  . Obstructive sleep apnea [G47.33] 03/18/2015  . Diabetes ([place]) [E11.9] 01/23/2015  . Obesity (BMI 30-39.9) [E66.9] 03/06/2014  . Acute encephalopathy [G93.40] 03/05/2014  . HLD (hyperlipidemia) [E78.5] 03/05/2014  . Anemia, normocytic normochromic [D64.9] 03/05/2014  . Altered mental state [R41.82]   . Protein-calorie malnutrition, severe ([place]) [E43] 10/09/2012  . Bladder tumor [D49.4] 08/08/2012  . Biliary dyskinesia [K82.8] 11/02/2010    Total Time spent with patient: 1 hour  Musculoskeletal: Strength & Muscle Tone: within normal limits Gait & Station: normal Patient leans: N/A  Psychiatric Specialty Exam: Review of Systems  Musculoskeletal: Positive for back pain.  All other systems reviewed and are negative.   Blood pressure 134/70, pulse 67, temperature 97.8 F (36.6 C), temperature source Oral, resp. rate 18, height 6' (1.829 m), weight 105.7 kg (233 lb), SpO2 100 %.Body mass index is 31.6 kg/m.  General Appearance: Casual  Eye Contact::  Good  Speech:  Clear and Coherent409  Volume:  Normal  Mood:  Euthymic  Affect:  Appropriate  Thought Process:  Goal Directed and Descriptions of Associations: Intact  Orientation:  Full (Time, Place, and Person)  Thought Content:  WDL  Suicidal Thoughts:  No  Homicidal Thoughts:  No  Memory:   Immediate;   Fair Recent;   Fair Remote;   Fair  Judgement:  Fair  Insight:  Fair  Psychomotor Activity:  Normal  Concentration:  Fair  Recall:  [employer] [place]  Language: Fair  Akathisia:  No  Handed:  Right  AIMS (if indicated):     Assets:  Communication Skills Desire for Improvement Financial Resources/Insurance Housing Intimacy Resilience Social Support Transportation  Sleep:  Number of Hours: 5.3  Cognition: WNL  ADL's:  Intact   Mental Status Per Nursing Assessment::   On Admission:     Demographic Factors:  Male and Caucasian  Loss Factors: Decline in physical health  Historical Factors: Impulsivity  Risk Reduction Factors:   Responsible for children under 42 years of age, Sense of responsibility to family, Living with another person, especially a relative, Positive social support and Positive therapeutic relationship  Continued Clinical Symptoms:  Bipolar Disorder:   Bipolar II  Cognitive Features That Contribute To Risk:  None    Suicide Risk:  Minimal: No identifiable suicidal ideation.  Patients presenting with no risk factors but with morbid ruminations; may be classified as minimal risk based on the severity of the depressive symptoms    Plan Of Care/Follow-up recommendations:  Activity:  As tolerated. Diet:  Low sodium heart healthy ADA diet. Other:  Keep follow-up appointments.  Orson Slick, MD 05/06/2016, 1:14 PM

## 2016-05-06 NOTE — Progress Notes (Signed)
Addendum to discharge.  Patient left the unit at 1420 accompanied by wife and nurse.  Prescriptions left in room.  Patient notified and states he will come back tomorrow for prescriptions.

## 2016-05-06 NOTE — BHH Suicide Risk Assessment (Signed)
[facility] INPATIENT:  Family/Significant Other Suicide Prevention Education  Suicide Prevention Education:  Education Completed;Kim Arduini (wife [phone]), has been identified by the patient as the family member/significant other with whom the patient will be residing, and identified as the person(s) who will aid the patient in the event of a mental health crisis (suicidal ideations/suicide attempt).  With written consent from the patient, the family member/significant other has been provided the following suicide prevention education, prior to the and/or following the discharge of the patient.  The suicide prevention education provided includes the following:  Suicide risk factors  Suicide prevention and interventions  National Suicide Hotline telephone number  [facility] assessment telephone number  [facility] Emergency Assistance [place] and/or Residential Mobile Crisis Unit telephone number  Request made of family/significant other to:  Remove weapons (e.g., guns, rifles, knives), all items previously/currently identified as safety concern.    Remove drugs/medications (over-the-counter, prescriptions, illicit drugs), all items previously/currently identified as a safety concern.  The family member/significant other verbalizes understanding of the suicide prevention education information provided.  The family member/significant other agrees to remove the items of safety concern listed above.  Vincent [name] G. [place], [place] 05/06/2016, 1:58 PM

## 2016-05-06 NOTE — Progress Notes (Signed)
Patient ID: Vincent [name], male   DOB: 1966/02/01, 51 y.o.   MRN: [id number]  CSW spoke with patient's wife and informed her of patient's discharge today. Patient is a new admit to the unit and being discharged within 24 hours of discharge. Patient plans to follow-up at [facility] in [place] for outpatient services including medication management and outpatient therapy. Patient was admitted 05/06/2015 in the evening and being discharged on the weekend when agencies are closed. Appointments will be scheduled when agency re-opens on Monday and patient will be informed by phone of is appointments by assigned CSW.   CSW emailed Selena Batten, who manages/schedules appointments for [facility] in [place] to schedule patient's appointments for medication management and outpatient therapy. CSW cc'd assigned CSW to the email so the patient's appointments can be finalized by Monday.  Illiana Losurdo G. [place], [facility] 05/06/2016 2:38 PM
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
## 2016-05-06 NOTE — H&P (Signed)
Psychiatric Admission Assessment Adult  Patient Identification: PEYTIN TABOADA MRN:  [id number] Date of Evaluation:  05/06/2016 Chief Complaint:  major depressive disorder Principal Diagnosis: Bipolar 2 disorder ([place]) Diagnosis:   Patient Active Problem List   Diagnosis Date Noted  . Tobacco use disorder [F17.200] 05/05/2016  . Insomnia secondary to chronic pain [G89.29, G47.01] 06/02/2015  . Altered mental status [R41.82] 05/28/2015  . Acute bronchitis [J20.9] 05/28/2015  . Type 2 diabetes mellitus with hyperglycemia ([place]) [E11.65] 05/28/2015  . Hypothyroidism [E03.9] 05/28/2015  . Bipolar 2 disorder ([place]) [F31.81] 05/28/2015  . History of rheumatoid arthritis [Z87.39] 05/28/2015  . Chronic pain [G89.29] 05/28/2015  . Depression [F32.9] 03/18/2015  . Obstructive sleep apnea [G47.33] 03/18/2015  . Diabetes ([place]) [E11.9] 01/23/2015  . Obesity (BMI 30-39.9) [E66.9] 03/06/2014  . Acute encephalopathy [G93.40] 03/05/2014  . HLD (hyperlipidemia) [E78.5] 03/05/2014  . Anemia, normocytic normochromic [D64.9] 03/05/2014  . Altered mental state [R41.82]   . Protein-calorie malnutrition, severe ([place]) [E43] 10/09/2012  . Bladder tumor [D49.4] 08/08/2012  . Biliary dyskinesia [K82.8] 11/02/2010   History of Present Illness:   Identifying data. Mr. Waite is a 51 year old male with history of bipolar disorder and chronic pain.  Chief complaint. "This is all one misunderstanding."  History of present illness. Information was obtained from the patient and the chart. The patient reports that yesterday he went to [facility] behavioral health outpatient clinic to make arrangements for psychotherapy appointments. He has been having some difficulties in his marriage and wanted to learn better coping skills. During the initial evaluation the patient was misunderstood and was placed under involuntary commitment, transferred to [facility]: Emergency room, then to [facility] for presumed  suicidal ideation. The patient adamantly denies any thoughts, intentions, or plans to hurt himself or others. This was stated during his emergency room visit yesterday as well. The patient has never attempted suicide or has been hospitalized in a mental institution. He is mildly bipolar disorder has been managed by his primary care provider for years. He was doing well on lithium. He was switched to Depakote after he developed tremors from lithium. The patient reports that he his father-in-law and his son all are gun owners and frequently go to a shooting ranch. The patient does not have an gun safe and keep his guns and keeps his guns at his in-laws house where they are safely locked away. Somehow the fact that he did not his gun to his in-laws was taken out of context and as an indication that the patient felt unsafe thinking about suicide. The patient denies any symptoms of depression, anxiety, or psychosis. He reports that he gets easily irritated and agitated. When he feels this way he usually drives his truck around not being around other family members and not to upset them. He decided that maybe it was time to learn better coping skills and this is why he was seeking therapy. He did go to marriage counseling with his wife few years back. The patient denied alcohol, illicit substance, or prescription pill abuse.  Past psychiatric history. He has been treated for bipolar illness by his primary provider for years. He was on lithium in the past. This was discontinued due to unacceptable tremors. He is now on Depakote. The patient insists that he is also taking Lamictal 200 mg twice a day. This is a dangerous combination with Lamictal and I am not sure if he is correct. He has never been hospitalized in a mental hospital. There were no  suicide attempts. He went to marriage counseling at some point.  Family psychiatric history. Nonreported.  Social history. He is disabled from back injury. He lives with his  wife of 30 years and his son. He is a Therapist, art. He has a long history of diabetes that he has been managed quite successfully with high doses of insulin. He reports that since January using [employer] point system he lost 22 pounds. She also has better eating habits. The patient experienced severe hypoglycemia last night in the hospital after he was given his regular dose of insulin. He believes that this was due to the fact that he has not eaten all day long yesterday being at the doctor's office and emergency room.  Total Time spent with patient: 1 hour  Is the patient at risk to self? No.  Has the patient been a risk to self in the past 6 months? No.  Has the patient been a risk to self within the distant past? No.  Is the patient a risk to others? No.  Has the patient been a risk to others in the past 6 months? No.  Has the patient been a risk to others within the distant past? No.   Prior Inpatient Therapy:   Prior Outpatient Therapy:    Alcohol Screening: 1. How often do you have a drink containing alcohol?: Never 2. How many drinks containing alcohol do you have on a typical day when you are drinking?: 3 or 4 3. How often do you have six or more drinks on one occasion?: Never Preliminary Score: 1 4. How often during the last year have you found that you were not able to stop drinking once you had started?: Never 6. How often during the last year have you needed a first drink in the morning to get yourself going after a heavy drinking session?: Never 7. How often during the last year have you had a feeling of guilt of remorse after drinking?: Never 8. How often during the last year have you been unable to remember what happened the night before because you had been drinking?: Never 9. Have you or someone else been injured as a result of your drinking?: No 10. Has a relative or friend or a doctor or another health worker been concerned about your drinking or suggested you cut down?:  No Alcohol Use Disorder Identification Test Final Score (AUDIT): 1 Brief Intervention: AUDIT score less than 7 or less-screening does not suggest unhealthy drinking-brief intervention not indicated Substance Abuse History in the last 12 months:  No. Consequences of Substance Abuse: NA Previous Psychotropic Medications: Yes  Psychological Evaluations: No  Past Medical History:  Past Medical History:  Diagnosis Date  . Anxiety   . Arthritis   . Bipolar 1 disorder ([place])   . Chronic pain syndrome    back  . COPD (chronic obstructive pulmonary disease) ([place])   . Gastroparesis   . GERD (gastroesophageal reflux disease)   . Hiatal hernia   . History of bladder cancer urologist-  dr Consuella Lose   papillay TCC (Ta G1)  s/p TURBT and chemo instillation 2014  . History of encephalopathy 05/27/2015   admission w/ acute encephalopathy thought to be secondary to pain meds and COPD  . History of gastric ulcer   . History of kidney stones   . History of TIA (transient ischemic attack) 2008    no residual  . History of traumatic head injury 2010   w/ LOC  per pt  needed stitches  . Hyperlipidemia   . Hypothyroidism   . Insomnia    per sleep study 04-19-2015 without sleep apnea  . Neuropathy in diabetes ([place])    LOWER EXTREMITIES  . Poor historian   . Seizures, transient [facility]) neurologist-  dr Krista Blue--  differential dx complex partial seizure .vs.  mood disorder .vs.  pseudoseizure--  negative EEG's   confusion episodes and staring spells since 11/ 2015  . Transient confusion NEUOROLOGIST-  DR YAN   Episodes since 11/ 2015--  neurologist dx  differential complex partial seizure  .vs. mood disorder . vs. pseudoseizure  . Type 2 diabetes mellitus treated with insulin ([place])   . Urinary hesitancy     Past Surgical History:  Procedure Laterality Date  . CARDIAC CATHETERIZATION  12-27-2001  DR Einar Gip  &  05-26-2009  DR Irish Lack   RESULTS FOR BOTH ARE NORMAL CORONARIES AND PERSERVED LVF/ EF 60%  .  CARPAL TUNNEL RELEASE Right 09-16-2003  . CARPAL TUNNEL RELEASE Left 02/25/2015   Procedure: LEFT CARPAL TUNNEL RELEASE;  Surgeon: Leanora Cover, MD;  Location: [facility];  Service: Orthopedics;  Laterality: Left;  . CYSTOSCOPY N/A 10/10/2012   Procedure: CYSTOSCOPY CLOT EVACUATION FULGERATION OF BLEEDERS ;  Surgeon: Claybon Jabs, MD;  Location: [facility];  Service: Urology;  Laterality: N/A;  . CYSTOSCOPY WITH BIOPSY N/A 11/26/2015   Procedure: CYSTOSCOPY WITH BIOPSY AND FULGURATION;  Surgeon: Kathie Rhodes, MD;  Location: [place] [facility];  Service: Urology;  Laterality: N/A;  . LAPAROSCOPIC CHOLECYSTECTOMY  11-17-2010  . NEGATIVE SLEEP STUDY  04-19-2015  in epic  . ORCHIECTOMY Right 02/21/2016   Procedure: SCROTAL ORCHIECTOMY with TESTICULAR PROSTHESIS IMPLANT;  Surgeon: Kathie Rhodes, MD;  Location[facility];  Service: Urology;  Laterality: Right;  . ROTATOR CUFF REPAIR Right 12/2004  . TRANSURETHRAL RESECTION OF BLADDER TUMOR N/A 08/09/2012   Procedure: TRANSURETHRAL RESECTION OF BLADDER TUMOR (TURBT) WITH GYRUS WITH MITOMYCIN C;  Surgeon: Claybon Jabs, MD;  Location: [facility];  Service: Urology;  Laterality: N/A;  . TRANSURETHRAL RESECTION OF BLADDER TUMOR WITH GYRUS (TURBT-GYRUS) N/A 02/27/2014   Procedure: TRANSURETHRAL RESECTION OF BLADDER TUMOR WITH GYRUS (TURBT-GYRUS);  Surgeon: Claybon Jabs, MD;  Location: [facility];  Service: Urology;  Laterality: N/A;   Family History:  Family History  Problem Relation Age of Onset  . Diabetes Mother   . Heart attack Mother   . Diabetes Father   . Hypertension Father   . Heart attack Father    Tobacco Screening: Have you used any form of tobacco in the last 30 days? (Cigarettes, Smokeless Tobacco, Cigars, and/or Pipes): Yes Tobacco use, Select all that apply: 5 or more cigarettes per day Are you interested in Tobacco Cessation Medications?: No,  patient refused Counseled patient on smoking cessation including recognizing danger situations, developing coping skills and basic information about quitting provided: Refused/Declined practical counseling Social History:  History  Alcohol Use  . Yes    Comment: RARE     History  Drug Use No    Additional Social History: Marital status: Married Number of Years Married: 48 What types of issues is patient dealing with in the relationship?: n/a Additional relationship information: n/a Are you sexually active?: Yes What is your sexual orientation?: heterosexual Has your sexual activity been affected by drugs, alcohol, medication, or emotional stress?: n/a Does patient have children?: Yes How many children?: 4 How is patient's relationship with their children?:  3 girls and 1 boy. Pt states he has a great relationship with his children.                          Allergies:   Allergies  Allergen Reactions  . Celebrex [Celecoxib] Anaphylaxis and Rash  . Hydrocodone Rash and Other (See Comments)    "blisters developed on arms"  . Sulfa Antibiotics Rash   Lab Results:  Results for orders placed or performed during the hospital encounter of 05/05/16 (from the past 48 hour(s))  Glucose, capillary     Status: None   Collection Time: 05/05/16  9:27 PM  Result Value Ref Range   Glucose-Capillary 90 65 - 99 mg/dL  Glucose, capillary     Status: Abnormal   Collection Time: 05/06/16 12:04 AM  Result Value Ref Range   Glucose-Capillary 40 (LL) 65 - 99 mg/dL  Glucose, capillary     Status: None   Collection Time: 05/06/16 12:17 AM  Result Value Ref Range   Glucose-Capillary 80 65 - 99 mg/dL  Urine Drug Screen, Qualitative ([facility] only)     Status: None   Collection Time: 05/06/16 12:19 AM  Result Value Ref Range   Tricyclic, Ur Screen NONE DETECTED NONE DETECTED   Amphetamines, Ur Screen NONE DETECTED NONE DETECTED   MDMA (Ecstasy)Ur Screen NONE DETECTED NONE DETECTED   Cocaine  Metabolite,Ur [place] NONE DETECTED NONE DETECTED   Opiate, Ur Screen NONE DETECTED NONE DETECTED   Phencyclidine (PCP) Ur S NONE DETECTED NONE DETECTED   Cannabinoid 50 Ng, Ur [place] NONE DETECTED NONE DETECTED   Barbiturates, Ur Screen NONE DETECTED NONE DETECTED   Benzodiazepine, Ur Scrn NONE DETECTED NONE DETECTED   Methadone Scn, Ur NONE DETECTED NONE DETECTED    Comment: (NOTE) [id number]  Tricyclics, urine               Cutoff 1000 ng/mL 200  Amphetamines, urine             Cutoff 1000 ng/mL 300  MDMA (Ecstasy), urine           Cutoff 500 ng/mL 400  Cocaine Metabolite, urine       Cutoff 300 ng/mL 500  Opiate, urine                   Cutoff 300 ng/mL 600  Phencyclidine (PCP), urine      Cutoff 25 ng/mL 700  Cannabinoid, urine              Cutoff 50 ng/mL 800  Barbiturates, urine             Cutoff 200 ng/mL 900  Benzodiazepine, urine           Cutoff 200 ng/mL 1000 Methadone, urine                Cutoff 300 ng/mL 1100 1200 The urine drug screen provides only a preliminary, unconfirmed 1300 analytical test result and should not be used for non-medical 1400 purposes. Clinical consideration and professional judgment should 1500 be applied to any positive drug screen result due to possible 1600 interfering substances. A more specific alternate chemical method 1700 must be used in order to obtain a confirmed analytical result.  1800 Gas chromato graphy / mass spectrometry (GC/MS) is the preferred 1900 confirmatory method.   Lipid panel     Status: Abnormal   Collection Time: 05/06/16  6:49 AM  Result Value Ref Range   Cholesterol  277 (H) 0 - 200 mg/dL   Triglycerides 431 (H) <150 mg/dL   HDL 34 (L) >40 mg/dL   Total CHOL/HDL Ratio 8.1 RATIO   VLDL UNABLE TO CALCULATE IF TRIGLYCERIDE OVER 400 mg/dL 0 - 40 mg/dL   LDL Cholesterol UNABLE TO CALCULATE IF TRIGLYCERIDE OVER 400 mg/dL 0 - 99 mg/dL    Comment:        Total Cholesterol/HDL:CHD Risk Coronary Heart Disease Risk Table                      Men   Women  1/2 Average Risk   3.4   3.3  Average Risk       5.0   4.4  2 X Average Risk   9.6   7.1  3 X Average Risk  23.4   11.0        Use the calculated Patient Ratio above and the CHD Risk Table to determine the patient's CHD Risk.        ATP III CLASSIFICATION (LDL):  <100     mg/dL   Optimal  100-129  mg/dL   Near or Above                    Optimal  130-159  mg/dL   Borderline  160-189  mg/dL   High  >190     mg/dL   Very High   TSH     Status: None   Collection Time: 05/06/16  6:49 AM  Result Value Ref Range   TSH 3.735 0.350 - 4.500 uIU/mL    Comment: Performed by a 3rd Generation assay with a functional sensitivity of <=0.01 uIU/mL.  Valproic acid level     Status: Abnormal   Collection Time: 05/06/16  6:49 AM  Result Value Ref Range   Valproic Acid Lvl 17 (L) 50.0 - 100.0 ug/mL  Glucose, capillary     Status: None   Collection Time: 05/06/16  6:51 AM  Result Value Ref Range   Glucose-Capillary 82 65 - 99 mg/dL  Glucose, capillary     Status: Abnormal   Collection Time: 05/06/16 11:34 AM  Result Value Ref Range   Glucose-Capillary 161 (H) 65 - 99 mg/dL    Blood Alcohol level:  Lab Results  Component Value Date   ETH <5 05/05/2016   ETH <5 [id number]    Metabolic Disorder Labs:  Lab Results  Component Value Date   HGBA1C 8.0 03/02/2015   MPG 166 (H) 03/06/2014   MPG 222 01/21/2007   No results found for: PROLACTIN Lab Results  Component Value Date   CHOL 277 (H) 05/06/2016   TRIG 431 (H) 05/06/2016   HDL 34 (L) 05/06/2016   CHOLHDL 8.1 05/06/2016   VLDL UNABLE TO CALCULATE IF TRIGLYCERIDE OVER 400 mg/dL 05/06/2016   LDLCALC UNABLE TO CALCULATE IF TRIGLYCERIDE OVER 400 mg/dL 05/06/2016   LDLCALC  05/27/2009    UNABLE TO CALCULATE IF TRIGLYCERIDE OVER 400 mg/dL        Total Cholesterol/HDL:CHD Risk Coronary Heart Disease Risk Table                     Men   Women  1/2 Average Risk   3.4   3.3  Average Risk       5.0   4.4  2 X Average  Risk   9.6   7.1  3 X Average Risk  23.4   11.0  Use the calculated Patient Ratio above and the CHD Risk Table to determine the patient's CHD Risk.        ATP III CLASSIFICATION (LDL):  <100     mg/dL   Optimal  100-129  mg/dL   Near or Above                    Optimal  130-159  mg/dL   Borderline  160-189  mg/dL   High  >190     mg/dL   Very High    Current Medications: Current Facility-Administered Medications  Medication Dose Route Frequency Provider Last Rate Last Dose  . acetaminophen (TYLENOL) tablet 650 mg  650 mg Oral Q6H PRN Maggie Dworkin B Shelbia Scinto, MD      . alum & mag hydroxide-simeth (MAALOX/MYLANTA) 200-200-20 MG/5ML suspension 30 mL  30 mL Oral Q4H PRN Dellanira Dillow B Saniyyah Elster, MD      . atorvastatin (LIPITOR) tablet 40 mg  40 mg Oral q1800 Gladstone Lighter, MD      . divalproex (DEPAKOTE ER) 24 hr tablet 1,000 mg  1,000 mg Oral BID Clovis Fredrickson, MD      . Derrill Memo ON 05/07/2016] fenofibrate tablet 160 mg  160 mg Oral Daily Gladstone Lighter, MD      . fentaNYL ([place] - dosed mcg/hr) 75 mcg  75 mcg Transdermal Q48H Gerianne Simonet B Itzael Liptak, MD      . folic acid (FOLVITE) tablet 2 mg  2 mg Oral Daily Clovis Fredrickson, MD   2 mg at 05/06/16 0839  . [START ON 05/07/2016] gabapentin (NEURONTIN) capsule 1,200 mg  1,200 mg Oral BID Taviana Westergren B Doshie Maggi, MD      . insulin aspart (novoLOG) injection 0-5 Units  0-5 Units Subcutaneous QHS Tionne Dayhoff B Alaia Lordi, MD      . insulin aspart (novoLOG) injection 0-9 Units  0-9 Units Subcutaneous TID WC Gladstone Lighter, MD   2 Units at 05/06/16 1141  . insulin aspart protamine- aspart (NOVOLOG MIX 70/30) injection 25 Units  25 Units Subcutaneous BID WC Gladstone Lighter, MD      . lamoTRIgine (LAMICTAL) tablet 200 mg  200 mg Oral BID Lachlan Pelto B Mykalah Saari, MD      . levothyroxine (SYNTHROID, LEVOTHROID) tablet 25 mcg  25 mcg Oral QAC breakfast Clovis Fredrickson, MD   25 mcg at 05/06/16 0647  . naloxegol oxalate (MOVANTIK)  tablet 25 mg  25 mg Oral Daily Kiwan Gadsden B Clea Dubach, MD      . nicotine (NICODERM CQ - dosed in mg/24 hours) patch 21 mg  21 mg Transdermal Daily Gladstone Lighter, MD   21 mg at 05/06/16 1143  . pantoprazole (PROTONIX) EC tablet 40 mg  40 mg Oral Daily Clovis Fredrickson, MD   40 mg at 05/06/16 0648  . [START ON 05/07/2016] pregabalin (LYRICA) capsule 100 mg  100 mg Oral Daily Dalbert Stillings B Ugochukwu Chichester, MD      . pregabalin (LYRICA) capsule 200 mg  200 mg Oral QHS Rosellen Lichtenberger B Dashonna Chagnon, MD       PTA Medications: Prescriptions Prior to Admission  Medication Sig Dispense Refill Last Dose  . amoxicillin-clavulanate (AUGMENTIN) 500-125 MG tablet Take 1 tablet (500 mg total) by mouth 2 (two) times daily. (Patient not taking: Reported on 05/05/2016) 10 tablet 0 Not Taking at Unknown time  . atorvastatin (LIPITOR) 10 MG tablet Take 10 mg by mouth every morning.    05/05/2016 at Unknown time  . divalproex (DEPAKOTE ER) 500 MG 24 hr tablet  Take 1,000 mg by mouth 2 (two) times daily.    05/05/2016 at Unknown time  . fentaNYL (DURAGESIC - DOSED MCG/HR) 75 MCG/HR Place 75 mcg onto the skin every 3 (three) days.   05/04/2016  . folic acid (FOLVITE) 1 MG tablet Take 1 mg by mouth daily.    05/05/2016 at Unknown time  . gabapentin (NEURONTIN) 600 MG tablet Take 1,200 mg by mouth 2 (two) times daily.    05/05/2016 at Unknown time  . HUMALOG MIX 75/25 KWIKPEN (75-25) 100 UNIT/ML Kwikpen INJECT 150 UNITS AT BREAKFAST AND 70 UNITS AT SUPPER. **APPOINTMENT NEEDED FOR FURTHER REFILLS (Patient not taking: Reported on 05/05/2016) 60 mL 0 Not Taking at Unknown time  . hydroxychloroquine (PLAQUENIL) 200 MG tablet Take 200 mg by mouth daily.   05/05/2016 at Unknown time  . Insulin Lispro Prot & Lispro (HUMALOG MIX 75/25 KWIKPEN) (75-25) 100 UNIT/ML Kwikpen Inject 160 units at breakfast and 60 units at supper. 20 pen 3 05/05/2016 at Unknown time  . lamoTRIgine (LAMICTAL) 100 MG tablet Take 2 tablets (200 mg total) by mouth 2 (two) times  daily. Please call [phone] to schedule yearly appt. 360 tablet 0 05/05/2016 at Unknown time  . levothyroxine (SYNTHROID, LEVOTHROID) 25 MCG tablet Take 25 mcg by mouth daily before breakfast.    05/05/2016 at Unknown time  . Methotrexate, PF, (RASUVO) 25 MG/0.5ML SOAJ Inject 25 mg into the skin every 7 (seven) days.   a week  . Naloxegol Oxalate (MOVANTIK PO) Take 25 mg by mouth daily.   05/05/2016 at Unknown time  . omeprazole (PRILOSEC) 20 MG capsule Take 20 mg by mouth every morning.    05/05/2016 at Unknown time  . Oxycodone HCl 10 MG TABS Take 1 tablet (10 mg total) by mouth every 4 (four) hours as needed. (Patient not taking: Reported on 05/05/2016) 30 tablet 0 Not Taking at Unknown time  . oxyCODONE-acetaminophen (PERCOCET/ROXICET) 5-325 MG tablet Take by mouth every 4 (four) hours as needed for severe pain.   unknown  . pregabalin (LYRICA) 100 MG capsule Take 100-200 mg by mouth 2 (two) times daily. Pt takes one capsule in the am and two at night.   05/05/2016 at Unknown time  . tadalafil (CIALIS) 20 MG tablet Take 20 mg by mouth daily as needed for erectile dysfunction.   unknown    Musculoskeletal: Strength & Muscle Tone: within normal limits Gait & Station: normal Patient leans: N/A  Psychiatric Specialty Exam: I reviewed physical examination performed in the emergency room and agree with the findings. Physical Exam  Nursing note and vitals reviewed. Psychiatric: He has a normal mood and affect. His speech is normal and behavior is normal. Judgment and thought content normal. Cognition and memory are normal.    Review of Systems  Musculoskeletal: Positive for back pain.  All other systems reviewed and are negative.   Blood pressure 134/70, pulse 67, temperature 97.8 F (36.6 C), temperature source Oral, resp. rate 18, height 6' (1.829 m), weight 105.7 kg (233 lb), SpO2 100 %.Body mass index is 31.6 kg/m.  See SRA.                                                   Sleep:  Number of Hours: 5.3    Treatment Plan Summary: Daily contact with patient to assess and evaluate symptoms  and progress in treatment and Medication management   Mr. Hoffer is a 51 year old male with a history of bipolar disorder and chronic pain admitted for worsening of depression and suicidal ideation with a plan to shoot himself. Reportedly his guns were locked away by his mother-in-law.  1. Suicidal ideation. The patient adamantly denies any thoughts, intention or plans to hurt himself or others. He is able to contract for safety. He is forward thinking and optimistic about the future. He is a loving husband, father, and son.  2. Mood. We continued Depakote for mood stabilization. The patient believes that he is also taking Lamictal. We did not continue Lamictal due to interaction with Depakote.  3. Diabetes. Medicine consult is greatly appreciated. The patient had an episode of hypoglycemia last night. His insulin dose was substantially lowered. He is on ADA diet, SSI, and blood glucose monitoring.  4. Dyslipidemia. He is on Lipitor and now Fenofibrate.   5. Chronic pain. He is on fentanyl patch, Neurontin and Lyrica.  6. Hypothyroidism. He is on Synthroid.  7. Constipation. He is on Movantik.  8. Smoking. Nicotine patch is available.  9. GERD. He is on Protonix.  10. Metabolic syndrome monitoring. Lipid panel is elevated, her hemoglobin A1c is pending.  11. Disposition. The patient will be discharged home with his family. He will follow up with his primary care provider for medication management, [facility] for psychotherapy, and his endocrinologist for diabetes management.   Observation Level/Precautions:  15 minute checks  Laboratory:  CBC Chemistry Profile UDS UA  Psychotherapy:    Medications:    Consultations:    Discharge Concerns:    Estimated LOS:  Other:     Physician Treatment Plan for Primary Diagnosis: Bipolar 2 disorder  ([place]) Long Term Goal(s): Improvement in symptoms so as ready for discharge  Short Term Goals: Ability to identify changes in lifestyle to reduce recurrence of condition will improve, Ability to verbalize feelings will improve, Ability to disclose and discuss suicidal ideas, Ability to demonstrate self-control will improve and Ability to identify and develop effective coping behaviors will improve  Physician Treatment Plan for Secondary Diagnosis: Principal Problem:   Bipolar 2 disorder ([place]) Active Problems:   HLD (hyperlipidemia)   Diabetes ([place])   Hypothyroidism   Chronic pain   Tobacco use disorder  Long Term Goal(s): NA  Short Term Goals: NA  I certify that inpatient services furnished can reasonably be expected to improve the patient's condition.    Orson Slick, MD 2/24/201812:49 PM

## 2016-05-06 NOTE — Plan of Care (Signed)
Problem: Coping: Goal: Ability to demonstrate self-control will improve Outcome: Progressing Patient can identify names and purpose of each medication.  Patient verbalizes understanding Insulin orders.

## 2016-05-07 LAB — HEMOGLOBIN A1C
Hgb A1c MFr Bld: 9.1 % — ABNORMAL HIGH (ref 4.8–5.6)
Mean Plasma Glucose: 214 mg/dL

## 2016-05-07 NOTE — [facility] Group Notes (Signed)
[facility] LCSW Group Therapy  05/07/2016 9:27 AM  Type of Therapy:  Group Therapy  Participation Level:  Active  Participation Quality:  Appropriate and Sharing  Affect:  Appropriate  Cognitive:  Alert  Insight:  Engaged  Engagement in Therapy:  Engaged  Modes of Intervention:  Activity, Discussion, Education, Problem-solving, Curator of Progress/Problems: Coping Skills: Patients defined and discussed healthy coping skills. Patients identified healthy coping skills they would like to try during hospitalization and after discharge. CSW offered insight to varying coping skills that may have been new to patients such as practicing mindfulness.  Jaella Weinert G. [place], [place] 05/07/2016, 9:28 AM

## 2016-05-08 NOTE — Social Work (Signed)
Patient informed of outpatient appointments (therapy 3.19 at 10 AM w Leeann and medications management on 3.22 at 46 AM w Dr Daron Offer).  Advised that patient could call practice and ask to be put on cancellation list for earlier appointments, also given contact information for [facility] which has a walk in clinic.  Edwyna Shell, LCSW Lead Clinical Social Worker Phone:  [phone]

## 2016-05-17 ENCOUNTER — Ambulatory Visit: Payer: Medicare Other | Admitting: Endocrinology

## 2016-05-17 DIAGNOSIS — E1140 Type 2 diabetes mellitus with diabetic neuropathy, unspecified: Secondary | ICD-10-CM | POA: Diagnosis not present

## 2016-05-17 DIAGNOSIS — M47816 Spondylosis without myelopathy or radiculopathy, lumbar region: Secondary | ICD-10-CM | POA: Diagnosis not present

## 2016-05-17 DIAGNOSIS — G8929 Other chronic pain: Secondary | ICD-10-CM | POA: Diagnosis not present

## 2016-05-29 ENCOUNTER — Encounter (HOSPITAL_COMMUNITY): Payer: Self-pay | Admitting: Psychology

## 2016-05-29 ENCOUNTER — Ambulatory Visit (INDEPENDENT_AMBULATORY_CARE_PROVIDER_SITE_OTHER): Payer: 59 | Admitting: Psychology

## 2016-05-29 ENCOUNTER — Ambulatory Visit (HOSPITAL_COMMUNITY): Payer: Self-pay | Admitting: Psychology

## 2016-05-29 DIAGNOSIS — F3181 Bipolar II disorder: Secondary | ICD-10-CM | POA: Diagnosis not present
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
## 2016-06-01 ENCOUNTER — Ambulatory Visit (HOSPITAL_COMMUNITY): Payer: Self-pay | Admitting: Psychiatry

## 2016-06-02 ENCOUNTER — Telehealth: Payer: Self-pay | Admitting: Neurology

## 2016-06-02 NOTE — Telephone Encounter (Signed)
Please call, I reviewed the chart, patient had recurrent episode of confusion, was given lamotrigine for possible partial seizure, also as a mood stabilizer.  EEG was normal in February 2017,  I also reviewed the [facility] website, the benefit of tetanus vaccination outweight the risks, he should have the tetanus vaccination as well.

## 2016-06-02 NOTE — Telephone Encounter (Signed)
Nurse Amy under Dr Claris Gower office called to say that pt has a wound on right leg. Injection of rocethin was given on yesterday and is scheduled to f/u on Monday for his leg. They are wanting to get him a tetanus shot but due to seziures they have been advised that he should only get a TD(not T.Dap), Amy is requesting a call to see if Dr Krista Blue will give authorization for it, Pt did just have a seziure on Monday the 19th. Pt confirmed he has not had a tetanus shot in over 15 years. Amy can be reached at [phone]. They are open until 6 and do not close for lunch

## 2016-06-02 NOTE — Telephone Encounter (Signed)
Spoke to Amy - she is aware of Dr. Rhea Belton response and they will call the patient for his vaccination.

## 2016-06-06 DIAGNOSIS — N5201 Erectile dysfunction due to arterial insufficiency: Secondary | ICD-10-CM | POA: Diagnosis not present

## 2016-06-06 DIAGNOSIS — Z8551 Personal history of malignant neoplasm of bladder: Secondary | ICD-10-CM | POA: Diagnosis not present

## 2016-06-14 ENCOUNTER — Ambulatory Visit (HOSPITAL_COMMUNITY): Payer: Self-pay | Admitting: Psychology

## 2016-06-16 ENCOUNTER — Other Ambulatory Visit (HOSPITAL_COMMUNITY): Payer: Self-pay

## 2016-06-16 ENCOUNTER — Ambulatory Visit (INDEPENDENT_AMBULATORY_CARE_PROVIDER_SITE_OTHER): Payer: 59 | Admitting: Psychiatry

## 2016-06-16 ENCOUNTER — Encounter (HOSPITAL_COMMUNITY): Payer: Self-pay | Admitting: Psychiatry

## 2016-06-16 VITALS — BP 130/76 | HR 72 | Ht 72.0 in | Wt 228.8 lb

## 2016-06-16 DIAGNOSIS — Z811 Family history of alcohol abuse and dependence: Secondary | ICD-10-CM | POA: Diagnosis not present

## 2016-06-16 DIAGNOSIS — Z79891 Long term (current) use of opiate analgesic: Secondary | ICD-10-CM | POA: Diagnosis not present

## 2016-06-16 DIAGNOSIS — Z79899 Other long term (current) drug therapy: Secondary | ICD-10-CM | POA: Diagnosis not present

## 2016-06-16 DIAGNOSIS — F331 Major depressive disorder, recurrent, moderate: Secondary | ICD-10-CM | POA: Diagnosis not present

## 2016-06-16 DIAGNOSIS — R251 Tremor, unspecified: Secondary | ICD-10-CM

## 2016-06-16 DIAGNOSIS — F4312 Post-traumatic stress disorder, chronic: Secondary | ICD-10-CM | POA: Diagnosis not present

## 2016-06-16 DIAGNOSIS — F5104 Psychophysiologic insomnia: Secondary | ICD-10-CM

## 2016-06-16 DIAGNOSIS — Z818 Family history of other mental and behavioral disorders: Secondary | ICD-10-CM

## 2016-06-16 DIAGNOSIS — F17210 Nicotine dependence, cigarettes, uncomplicated: Secondary | ICD-10-CM

## 2016-06-16 MED ORDER — VENLAFAXINE HCL ER 75 MG PO CP24: 75.0000 mg | ORAL_CAPSULE | Freq: Every day | ORAL | 2 refills | Status: DC

## 2016-06-16 MED ORDER — DIVALPROEX SODIUM ER 500 MG PO TB24: 1500.0000 mg | ORAL_TABLET | Freq: Every day | ORAL | 0 refills | Status: DC

## 2016-06-16 MED ORDER — ALPRAZOLAM 1 MG PO TABS: 1.0000 mg | ORAL_TABLET | Freq: Three times a day (TID) | ORAL | 0 refills | Status: DC | PRN

## 2016-06-16 MED ORDER — MIRTAZAPINE 15 MG PO TABS: 15.0000 mg | ORAL_TABLET | Freq: Every day | ORAL | 2 refills | Status: DC
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
## 2016-06-16 NOTE — Patient Instructions (Signed)
START taking Effexor (venlafaxine) in the morning  START Mirtazepine ([name]) at night at about 8-9 PM  DECREASE Depakote to 500 mg (1 capsule)  in the morning and 1000 mg at night (2 capsules)  START Xanax 1 mg up to 3 times a day as needed for anger or anxiety  Come back in about 4-5 weeks to see me for a med check in  Lets get you started with group therapy Tuesday evening

## 2016-06-19 DIAGNOSIS — N5201 Erectile dysfunction due to arterial insufficiency: Secondary | ICD-10-CM | POA: Diagnosis not present

## 2016-06-20 ENCOUNTER — Ambulatory Visit (INDEPENDENT_AMBULATORY_CARE_PROVIDER_SITE_OTHER): Payer: 59 | Admitting: Licensed Clinical Social Worker

## 2016-06-20 DIAGNOSIS — F4312 Post-traumatic stress disorder, chronic: Secondary | ICD-10-CM

## 2016-06-21 ENCOUNTER — Ambulatory Visit (HOSPITAL_COMMUNITY): Payer: Self-pay | Admitting: Licensed Clinical Social Worker

## 2016-06-21 DIAGNOSIS — R4182 Altered mental status, unspecified: Secondary | ICD-10-CM | POA: Diagnosis not present

## 2016-06-22 ENCOUNTER — Encounter (HOSPITAL_BASED_OUTPATIENT_CLINIC_OR_DEPARTMENT_OTHER): Payer: 59 | Attending: Internal Medicine

## 2016-06-26 ENCOUNTER — Telehealth (HOSPITAL_COMMUNITY): Payer: Self-pay | Admitting: Licensed Clinical Social Worker

## 2016-06-26 ENCOUNTER — Telehealth: Payer: Self-pay | Admitting: *Deleted

## 2016-06-26 ENCOUNTER — Institutional Professional Consult (permissible substitution): Payer: 59 | Admitting: Neurology

## 2016-06-26 ENCOUNTER — Encounter (HOSPITAL_COMMUNITY): Payer: Self-pay | Admitting: Licensed Clinical Social Worker
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
## 2016-06-26 NOTE — Telephone Encounter (Signed)
Called pt to let him know that his 3 providers met and discussed a referral to another group other than a 1 hour outpatient group and it would be discussed fully at his next appointment with Vincent [name] on Wednesday. He was ok with the decision.

## 2016-06-26 NOTE — Progress Notes (Signed)
Daily Group Progress Note Program:  Outpatient Group Time: 5:30-6:30pm  Participation Level: Active  Behavioral Response: Appropriate  Type of Therapy:  Psychoeducation/Therapy  Summary of Progress: Pt participated in an introductory group. Today is the first day of the outpatient evening group. Pt articulated expectations of the group, what he needs to feel safe in the group and coping skills for mental wellness in recovery. He also gave some background information so the group can become more acquainted with patient.    Jenkins Rouge, LCAS \\

## 2016-06-27 ENCOUNTER — Ambulatory Visit (HOSPITAL_COMMUNITY): Payer: Self-pay | Admitting: Licensed Clinical Social Worker

## 2016-06-28 ENCOUNTER — Encounter: Payer: Self-pay | Admitting: Neurology

## 2016-06-28 ENCOUNTER — Ambulatory Visit (INDEPENDENT_AMBULATORY_CARE_PROVIDER_SITE_OTHER): Payer: 59 | Admitting: Neurology

## 2016-06-28 ENCOUNTER — Ambulatory Visit (INDEPENDENT_AMBULATORY_CARE_PROVIDER_SITE_OTHER): Payer: 59 | Admitting: Psychology

## 2016-06-28 ENCOUNTER — Ambulatory Visit (HOSPITAL_COMMUNITY): Payer: Self-pay | Admitting: Licensed Clinical Social Worker

## 2016-06-28 VITALS — BP 134/83 | HR 88 | Ht 72.0 in | Wt 228.5 lb

## 2016-06-28 DIAGNOSIS — F4312 Post-traumatic stress disorder, chronic: Secondary | ICD-10-CM | POA: Diagnosis not present

## 2016-06-28 DIAGNOSIS — R4182 Altered mental status, unspecified: Secondary | ICD-10-CM | POA: Diagnosis not present

## 2016-06-28 DIAGNOSIS — R251 Tremor, unspecified: Secondary | ICD-10-CM | POA: Diagnosis not present

## 2016-06-28 DIAGNOSIS — F331 Major depressive disorder, recurrent, moderate: Secondary | ICD-10-CM | POA: Diagnosis not present

## 2016-06-28 DIAGNOSIS — G9340 Encephalopathy, unspecified: Secondary | ICD-10-CM

## 2016-06-28 DIAGNOSIS — F5104 Psychophysiologic insomnia: Secondary | ICD-10-CM

## 2016-06-28 MED ORDER — LAMOTRIGINE 100 MG PO TABS: 200.0000 mg | ORAL_TABLET | Freq: Two times a day (BID) | ORAL | 4 refills | Status: DC

## 2016-06-28 NOTE — Progress Notes (Signed)
   THERAPIST PROGRESS NOTE  Session Time: 06/28/16  Participation Level: Active  Behavioral Response: Well GroomedDrowsyIrritable  Type of Therapy: Individual Therapy  Treatment Goals addressed: Diagnosis: PTSD and goal 1.  Interventions: CBT and Supportive  Summary: Vincent [name] is a 51 y.o. male who presents with report of just waking. Pt reported that he took a Xanax last night and woke about 1 hour ago and feels drowsy still.  Pt was able to focus and goal directed. Pt reported that no major incidents since last session.  Pt reported that he is sleeping better w/ new medication regimen.  Pt reported that he is still easily irritable and has had an anger outburst in the car the other day w/ traffic at stand still.  Pt also report some augments w/ wife as well.  Pt reported that background is different for both and her family always talks things out -he came from family where there was an authority and didn't go against.  Pt reported that he had made some plans and wife had made some plans both for same day and when he wouldn't cancel plans caused conflict that escalated. Pt reported that he ends up feeling backed in a corner and will shut down and yell that doesn't want anything to do w/ her at the time.  Pt acknowledges that this is hurtful.  Pt was receptive towards practice assertive language of needing space before says something that regrets.  Pt also receptive to grounding practice shared today.  Suicidal/Homicidal: Nowithout intent/plan  Therapist Response: Assessed pt current functioning per pt report. Processed w/pt his irritability and conflicts that have occurred.  Introduced pt to assertive language to express self in conflict and need for space.  Practiced w/ pt grounding technique.   Plan: Return for IOP.  Pt referred to IOP for severe chronic PTSD and depression.  Pt start date is 07/04/16  Diagnosis: PTSD, MDD    YATES,LEANNE, [facility] 06/28/2016

## 2016-06-28 NOTE — Progress Notes (Signed)
[facility]  PATIENT: Vincent [name] DOB: 04/10/1965   REASON FOR VISIT: Follow-up for episodes of transient confusional episodes, jerking of extremities history of bipolar disorder HISTORY FROM: Patient and wife    HISTORY OF PRESENT ILLNESS: HISTORY Vincent [name] is a 51 years old left-handed male, accompanied by his wife Vincent [name], seen in refer by his primary care physician [name] for evaluation of transient confusion episodes Chart reviewed, he had past medical history of hypertension, hyperlipidemia, diabetes, insulin-dependent, history of bladder cancer, bipolar disorder, is taking Lithium, chronic pain, on fentanyl patch, longtime smoker, 2 packs per day Since November 2015, he had multiple recurrent episode of confusion, sometimes he forgets that he has to take out his trash can, repeating his questions, was also noted by family to have staring spells, confusion can last up to 8 hours, there was no loss of consciousness Some of the episode has acute onset, in March 05 2014, he went outside of the yard after smoking a cigarette, he suddenly became confused, repeating his questions, agitated, combative, "as if he is on drugs " , the confusion last about 24 hours, he was taken by ambulance to the emergency room, UDS was negative, Lab reviewed, appetite is panel was negative, HIV was negative, ammonia was mildly elevated, it was considered due to polypharmacy fentanyl patch, Dilaudid, Ultram, Neurontin He reported history of head trauma in 2008, his right frontal region was hit by a rotatory plate, transient loss of consciousness, with skin abrasion, had multiple stitches. have personally reviewed MRI of the brain with and without contrast December 2015 that was normal, EEG was normal   UPDATE Oct 5th 2016:Vincent [name] EEG was normal, he tolerated lamotrigine very well, while he was taking 100 mg twice a day, he has much less current episode, only had 3 episode  since September 2016, short lasting, mild confusion,  I also watched the video tape March 05 2014, patient was agitated, confused, but no dysarthria, no lateralized motor deficit noticed during the video tape, he was searching through the knife Holder for his keys, could not find towels at his home  His wife reported recurrent events, August 24, he has prolonged tremor of his left arm, complains everything look orange, very sleepy afterwards November 06 2014, black stairs, lasting for 10 minutes, disoriented for the rest of the night, fell to sleep during dinnertime August 27, he complains of feeling strange, took a long nap, irritable afterwords August 30 first, disoriented confused, not sure where he was, fell to sleep, broke out in cold sweat afterwards September 2, he slept outside in the chair, woke up slumped to the floor, face looked droopy, September 6, he complains of confused, feel funny, September 10, complains of confused not feeling well,  He had no recurrent episode ever since. His mood has improved with lamotrigine as well  UPDATE Mar 18 2015:Vincent [name] He had few episodes since last visit, confused, mild slurred speech, slow talking, everything goes slow motion while taking The more he use his hands, the more shaky he got. He is now taking lamotrigine 100 mg in the morning, 200 mg at nighttime He also complains of difficulty sleeping, snoring, excessive daytime sleepiness, fatigue, ESS score is 15, FSS score is 57  UPDATE 04/01/2015 Mr. Vincent [name], 51 year old male returns for follow-up. He has been previously seen in this office by [name]. Apparently he had seizure-like activity on Saturday and then 2 seizures last night one lasting greater than 10 minutes. He  did not go to the emergency room. Apparently last night he walked outside to smoke cigarettes then his wife heard him pounding on the door, patient was able to talk to her however his head was shaking and his  entire body was shaking. I  watched a video and saw  no lateralized motor deficit. His eyes were closed. After the event he feels off balance with facial drooping. He is currently on Lamictal 200 twice daily. He was recently evaluated for OSA and is due to have a sleep study. He also needs repeat EEG. He returns for reevaluation. Previous records reviewed  UPDATE June 28 2016: He had sleep study in February 2018, which revealed insomnia, had frequent snore, infrequent periodic leg movement disorder, but no evidence of significant central or obstructive sleep apnea,, MRI of the brain in March 2017 was normal, EEG in February 2017 was normal, He continues to have seizure like activities, intermittent, once a week, he was was seen by psychologist Dr[facility], was diagnosed with PTSD.  He was given xanax,effecor, remeron, he was put on Depakote ER 1500mg  qhs since 2017, in the process of being off the Depakote by his psychologist,  He is now sleeping better, taking xanax 1mg  tid prn, [name] 150mg  qhs,   He complains of left hand tremor since 2017, getting worse, intermittent exacerbation to the point of difficulty feeding himself, tying his shoes, and is his dominant hand,  I reviewed laboratory evaluation February 2018, normal TSH, he is on thyroid supplement, Depakote level 17, glucose 82, A1c was 9.1, UDS was negative, cholesterol is 277, triglyceride is 431, HDL was 34  He was noted to have elaborate bilateral hands tremor, somewhat under volunteer control.  REVIEW OF SYSTEMS: Full 14 system review of systems performed and notable only for those listed, all others are neg:     ALLERGIES: Allergies  Allergen Reactions  . Celebrex [Celecoxib] Anaphylaxis and Rash  . Hydrocodone Rash and Other (See Comments)    "blisters developed on arms"  . Sulfa Antibiotics Rash    HOME MEDICATIONS: Outpatient Medications Prior to Visit  Medication Sig Dispense Refill  . ALPRAZolam (XANAX) 1 MG tablet  Take 1 tablet (1 mg total) by mouth 3 (three) times daily as needed for anxiety (anger, panic). 90 tablet 0  . atorvastatin (LIPITOR) 40 MG tablet Take 1 tablet (40 mg total) by mouth daily at 6 PM. 30 tablet 1  . clindamycin (CLEOCIN) 150 MG capsule     . divalproex (DEPAKOTE ER) 500 MG 24 hr tablet Take 3 tablets (1,500 mg total) by mouth at bedtime. 90 tablet 0  . fentaNYL (DURAGESIC - DOSED MCG/HR) 75 MCG/HR Place 100 mcg onto the skin every 3 (three) days.     . folic acid (FOLVITE) 1 MG tablet Take 1 mg by mouth daily.     Marland Kitchen gabapentin (NEURONTIN) 600 MG tablet Take 1,200 mg by mouth 2 (two) times daily.     . insulin lispro protamine-lispro (HUMALOG 75/25 MIX) (75-25) 100 UNIT/ML SUSP injection Inject into the skin.    Marland Kitchen lamoTRIgine (LAMICTAL) 100 MG tablet Take 200 mg by mouth 2 (two) times daily.     Marland Kitchen levothyroxine (SYNTHROID, LEVOTHROID) 25 MCG tablet Take 25 mcg by mouth daily before breakfast.     . Methotrexate, PF, (RASUVO) 25 MG/0.5ML SOAJ Inject 25 mg into the skin every 7 (seven) days.    . mirtazapine ([name]) 15 MG tablet Take 1 tablet (15 mg total)  by mouth at bedtime. 30 tablet 2  . Naloxegol Oxalate (MOVANTIK PO) Take 25 mg by mouth daily.    Marland Kitchen omeprazole (PRILOSEC) 20 MG capsule Take 20 mg by mouth every morning.     Marland Kitchen oxyCODONE-acetaminophen (PERCOCET/ROXICET) 5-325 MG tablet     . pregabalin (LYRICA) 100 MG capsule Take 100-200 mg by mouth 2 (two) times daily. Pt takes one capsule in the am and two at night.    . tadalafil (CIALIS) 20 MG tablet Take 20 mg by mouth daily as needed for erectile dysfunction.    Marland Kitchen venlafaxine XR (EFFEXOR XR) 75 MG 24 hr capsule Take 1 capsule (75 mg total) by mouth daily with breakfast. 30 capsule 2  . fenofibrate 160 MG tablet Take 1 tablet (160 mg total) by mouth daily. 30 tablet 1   No facility-administered medications prior to visit.     PAST MEDICAL HISTORY: Past Medical History:  Diagnosis Date  . Anxiety   . Arthritis   .  Bipolar 1 disorder ([place])   . Chronic pain syndrome    back  . COPD (chronic obstructive pulmonary disease) ([place])   . Gastroparesis   . GERD (gastroesophageal reflux disease)   . Hiatal hernia   . History of bladder cancer urologist-  dr Consuella Lose   papillay TCC (Ta G1)  s/p TURBT and chemo instillation 2014  . History of encephalopathy 05/27/2015   admission w/ acute encephalopathy thought to be secondary to pain meds and COPD  . History of gastric ulcer   . History of kidney stones   . History of TIA (transient ischemic attack) 2008    no residual  . History of traumatic head injury 2010   w/ LOC  per pt needed stitches  . Hyperlipidemia   . Hypothyroidism   . Insomnia    per sleep study 04-19-2015 without sleep apnea  . Neuropathy in diabetes ([place])    LOWER EXTREMITIES  . Poor historian   . Seizures, transient [facility]) neurologist-  dr Krista Blue--  differential dx complex partial seizure .vs.  mood disorder .vs.  pseudoseizure--  negative EEG's   confusion episodes and staring spells since 11/ 2015  . Transient confusion NEUOROLOGIST-  DR Bela Nyborg   Episodes since 11/ 2015--  neurologist dx  differential complex partial seizure  .vs. mood disorder . vs. pseudoseizure  . Type 2 diabetes mellitus treated with insulin ([place])   . Urinary hesitancy     PAST SURGICAL HISTORY: Past Surgical History:  Procedure Laterality Date  . CARDIAC CATHETERIZATION  12-27-2001  DR Einar Gip  &  05-26-2009  DR Irish Lack   RESULTS FOR BOTH ARE NORMAL CORONARIES AND PERSERVED LVF/ EF 60%  . CARPAL TUNNEL RELEASE Right 09-16-2003  . CARPAL TUNNEL RELEASE Left 02/25/2015   Procedure: LEFT CARPAL TUNNEL RELEASE;  Surgeon: Leanora Cover, MD;  Location: [place];  Service: Orthopedics;  Laterality: Left;  . CYSTOSCOPY N/A 10/10/2012   Procedure: CYSTOSCOPY CLOT EVACUATION FULGERATION OF BLEEDERS ;  Surgeon: Claybon Jabs, MD;  Location: [facility];  Service: Urology;  Laterality: N/A;  .  CYSTOSCOPY WITH BIOPSY N/A 11/26/2015   Procedure: CYSTOSCOPY WITH BIOPSY AND FULGURATION;  Surgeon: Kathie Rhodes, MD;  Location: [place];  Service: Urology;  Laterality: N/A;  . LAPAROSCOPIC CHOLECYSTECTOMY  11-17-2010  . NEGATIVE SLEEP STUDY  04-19-2015  in epic  . ORCHIECTOMY Right 02/21/2016   Procedure: SCROTAL ORCHIECTOMY with TESTICULAR PROSTHESIS IMPLANT;  Surgeon: Kathie Rhodes, MD;  Location:  [place];  Service: Urology;  Laterality: Right;  . ROTATOR CUFF REPAIR Right 12/2004  . TRANSURETHRAL RESECTION OF BLADDER TUMOR N/A 08/09/2012   Procedure: TRANSURETHRAL RESECTION OF BLADDER TUMOR (TURBT) WITH GYRUS WITH MITOMYCIN C;  Surgeon: Claybon Jabs, MD;  Location: [facility];  Service: Urology;  Laterality: N/A;  . TRANSURETHRAL RESECTION OF BLADDER TUMOR WITH GYRUS (TURBT-GYRUS) N/A 02/27/2014   Procedure: TRANSURETHRAL RESECTION OF BLADDER TUMOR WITH GYRUS (TURBT-GYRUS);  Surgeon: Claybon Jabs, MD;  Location[facility];  Service: Urology;  Laterality: N/A;    FAMILY HISTORY: Family History  Problem Relation Age of Onset  . Diabetes Mother   . Heart attack Mother   . Diabetes Father   . Hypertension Father   . Heart attack Father   . Alcohol abuse Father     SOCIAL HISTORY: Social History   Social History  . Marital status: Married    Spouse name: N/A  . Number of children: 3  . Years of education: GED   Occupational History  . Warden/ranger of company   Social History Main Topics  . Smoking status: Current Every Day Smoker    Packs/day: 1.50    Years: 25.00    Types: Cigarettes  . Smokeless tobacco: Never Used  . Alcohol use No  . Drug use: No  . Sexual activity: Not on file   Other Topics Concern  . Not on file   Social History Narrative   Lives at home with his wife and children.   Left-handed.   3-4 cups caffeine per day.     PHYSICAL EXAM  Vitals:    06/28/16 1609  BP: 134/83  Pulse: 88  Weight: 228 lb 8 oz (103.6 kg)  Height: 6' (1.829 m)   Body mass index is 30.99 kg/m. Gen: NAD, conversant, well nourised, obese, well groomed  Cardiovascular: Regular rate rhythm,  Neck: Supple, no carotid bruit. Pulmonary: Clear to auscultation bilaterally   NEUROLOGICAL EXAM:  MENTAL STATUS: Speech:  Speech is normal; fluent and spontaneous with normal comprehension.   Cognition: Tired looking middle-age male  He is oriented to date, month and place  Normal Attention span and concentration  Normal Language, naming, repeating,spontaneous speech  Fund of knowledge  CRANIAL NERVES: CN II: Visual fields are full to confrontation. Fundoscopic exam is normal with sharp discs and no vascular changes. Pupils are round equal and briskly reactive to light. CN III, IV, VI: extraocular movement are normal. No ptosis. CN V: Facial sensation is intact to pinprick in all 3 divisions bilaterally.  CN VII: Face is symmetric with normal eye closure and smile. CN VIII: Hearing is normal to rubbing fingers CN IX, X: Palate elevates symmetrically. Phonation is normal. CN XI: Head turning and shoulder shrug are intact CN XII: Tongue is midline with normal movements and no atrophy. Narrow oropharyngeal MOTOR:There is no pronator drift of out-stretched arms. Muscle bulk and tone are normal. Muscle strength is normal. REFLEXES:Reflexes are 2+ and symmetric at the biceps, triceps, knees, and ankles. Plantar responses are flexor. SENSORY:Intact to light touch, pinprick, position sense, and vibration sense are intact in fingers and toes. COORDINATION:Rapid alternating movements and fine finger movements are intact. There is no dysmetria on finger-to-nose and heel-knee-shin.  GAIT/STANCE: mildly wide based unsteady DIAGNOSTIC DATA (LABS, IMAGING, TESTING) -  Lab Results  Component Value Date   HGBA1C 9.1 (H) 05/06/2016  ASSESSMENT AND PLAN  51 y.o. year old male   PTSD On polypharmacy treatment Seizure-like event New onset bilateral hands tremor,  This can be related to exaggerated physiological tremor, or anxiety.  I have refilled his Lamotrigine  3 days in home vide eeg monitoring   Marcial Pacas, M.D. Ph.D.  [facility] [place], [place] [address] Phone: [phone] Fax:      [phone]

## 2016-07-04 ENCOUNTER — Ambulatory Visit (HOSPITAL_COMMUNITY): Payer: Self-pay | Admitting: Licensed Clinical Social Worker

## 2016-07-04 ENCOUNTER — Encounter (HOSPITAL_COMMUNITY): Payer: Self-pay | Admitting: Psychiatry

## 2016-07-04 NOTE — Progress Notes (Signed)
Vincent [name] is a 50 y.o. male who was referred per Binnie Rail, LCAS.  Pt attended the Aftercare Group once and group leader stated that he needed another level of care (MH-IOP). Pt arrived this morning and after spending time in group, he mentioned that he felt that he wasn't in the correct group.  "I need help with my specific issue, which is anger.  I need to get this anger under control for my sake and my family.  I need to get to the root of it.  That is all I want."  Pt denied SI/HI or A/V hallucinations.  A:  Referred pt to [facility].  Encouraged pt to stop by or call to enter their anger management program; which is a daily nine week program.  F/U with therapist [name], [facility]) on 07-05-16 @ 2:30 pm and Dr. Daron Offer on 07-13-16.  Encouraged support groups; especially the family/friends group thru [facility].  Informed Dr. Daron Offer and [name], [facility] of disposition.  R:  Pt receptive.        Carlis Abbott, RITA, M.Ed, CNA

## 2016-07-05 ENCOUNTER — Ambulatory Visit (HOSPITAL_COMMUNITY): Payer: Self-pay | Admitting: Licensed Clinical Social Worker

## 2016-07-05 ENCOUNTER — Encounter (HOSPITAL_COMMUNITY): Payer: Self-pay | Admitting: Psychology

## 2016-07-05 ENCOUNTER — Ambulatory Visit (HOSPITAL_COMMUNITY): Payer: Self-pay | Admitting: Psychology

## 2016-07-05 NOTE — Progress Notes (Signed)
Vincent [name] is a 51 y.o. male patient who called to inform unable to make today's appointment.  Next appointment scheduled for 07/12/16.        [name], LPC

## 2016-07-11 ENCOUNTER — Ambulatory Visit (HOSPITAL_COMMUNITY): Payer: Self-pay | Admitting: Licensed Clinical Social Worker

## 2016-07-12 ENCOUNTER — Ambulatory Visit (INDEPENDENT_AMBULATORY_CARE_PROVIDER_SITE_OTHER): Payer: 59 | Admitting: Psychology

## 2016-07-12 ENCOUNTER — Ambulatory Visit (HOSPITAL_COMMUNITY): Payer: Self-pay | Admitting: Licensed Clinical Social Worker

## 2016-07-12 DIAGNOSIS — F4312 Post-traumatic stress disorder, chronic: Secondary | ICD-10-CM

## 2016-07-12 DIAGNOSIS — G8929 Other chronic pain: Secondary | ICD-10-CM | POA: Diagnosis not present

## 2016-07-12 DIAGNOSIS — M47816 Spondylosis without myelopathy or radiculopathy, lumbar region: Secondary | ICD-10-CM | POA: Diagnosis not present

## 2016-07-12 NOTE — Progress Notes (Signed)
   THERAPIST PROGRESS NOTE  Session Time: 2.30pm-3.11pm  Participation Level: Active  Behavioral Response: Well GroomedAlertDepressed and Irritable  Type of Therapy: Individual Therapy  Treatment Goals addressed: Diagnosis: PTSD and goal 1.  Interventions: CBT and Other: grounding practies  Summary: Vincent [name] is a 51 y.o. male who presents with affect WNL.  Pt reported that he went to [facility] for their anger management group and informed that group meets on Saturday morning which doesn't work for his schedule.  Pt reported he will continue w/ individual and medication management at this time.  Pt reports that he is not sleeping well- only sleeps about 2- 3 hours if can fall asleep and then back up.  Pt reports that he is using Xanax sparingly as makes so tired that can't drive or do other activities when uses.  Pt reported last week he was very withdrawn from everyone for about 3 days and more depressed.  Pt reports he continues to be easily irritable and angered as well.  Pt was able to identify quiet- calm environments as relaxing for him and how to incorporate this each day.  Pt was receptive to grounding practices and was able to practice in session.  Pt reported he feels more calm just with little bit of practice in session.  Pt enjoyed shifting focus to body parts and was able to ground and be mindful.  Pt practiced hearth math- heart focus and heart breathing and reported more difficult but also willing to practice.  Pt identifies when can practice throughout day.  Suicidal/Homicidal: Nowithout intent/plan  Therapist Response: Assessed pt current functioning per pt report.  Discussed continuing w/ individual counseling and medication management as groups referred to haven't worked out.  Discussed w/ pt deescalating skills to assist w/ coping w/ irritations.  Introduced to Tax adviser or shifting awareness to feet, hands, lower abdomen and hearth math breathing practice.  Processed  w/pt effect and how to use for self to building practice in daily life.   Plan: Return again in 1 weeks.  Diagnosis: PTSD    Raina Sole, [place] 07/12/2016

## 2016-07-13 ENCOUNTER — Ambulatory Visit (INDEPENDENT_AMBULATORY_CARE_PROVIDER_SITE_OTHER): Payer: 59 | Admitting: Psychiatry

## 2016-07-13 ENCOUNTER — Encounter (HOSPITAL_COMMUNITY): Payer: Self-pay | Admitting: Psychiatry

## 2016-07-13 VITALS — BP 130/64 | HR 75 | Ht 72.0 in | Wt 225.0 lb

## 2016-07-13 DIAGNOSIS — Z811 Family history of alcohol abuse and dependence: Secondary | ICD-10-CM | POA: Diagnosis not present

## 2016-07-13 DIAGNOSIS — F4312 Post-traumatic stress disorder, chronic: Secondary | ICD-10-CM

## 2016-07-13 DIAGNOSIS — Z79899 Other long term (current) drug therapy: Secondary | ICD-10-CM | POA: Diagnosis not present

## 2016-07-13 DIAGNOSIS — F17210 Nicotine dependence, cigarettes, uncomplicated: Secondary | ICD-10-CM | POA: Diagnosis not present

## 2016-07-13 DIAGNOSIS — F5104 Psychophysiologic insomnia: Secondary | ICD-10-CM | POA: Diagnosis not present

## 2016-07-13 DIAGNOSIS — F331 Major depressive disorder, recurrent, moderate: Secondary | ICD-10-CM | POA: Diagnosis not present

## 2016-07-13 MED ORDER — DIVALPROEX SODIUM ER 500 MG PO TB24: 1000.0000 mg | ORAL_TABLET | Freq: Every day | ORAL | 0 refills | Status: DC

## 2016-07-13 MED ORDER — VENLAFAXINE HCL ER 150 MG PO CP24: 150.0000 mg | ORAL_CAPSULE | Freq: Every day | ORAL | 1 refills | Status: DC

## 2016-07-13 NOTE — Patient Instructions (Addendum)
Increase Effexor (venlafaxine) to 150 mg daily (I am sending a new sized tablet)  Decrease Depakote to 1000 mg ( 2 tablets) at night  Continue [name] 15 mg at night  Take 1/2 of a pill of Xanax at night for sleep, with the remeron

## 2016-07-13 NOTE — Progress Notes (Signed)
[place] MD/PA/NP OP Progress Note  07/13/2016 4:42 PM Vincent [name]  MRN:  [id number]  Chief Complaint:  Chief Complaint    Follow-up     Subjective:  Vincent [name] [facility] continues to participate in therapy with Leanne.  He will not be doing group therapies for this time being, but may consider it later on.  He feels comfortable continuing in therapy and medication management for now.  He shares that he had a couple episodes of anger and irritability, feels like he did better though, than the previous weeks. He reports that his anger last for shorter periods of times that it used to. He got angry on 2 or 3 occasions over the past 3 weeks, but did not end up throwing items, like he had done in the past. He feels like Effexor is being beneficial, and is agreeable to increase the dose to 150 mg daily. In terms of sleep, he reports that he took [name] 15 mg, with 1 mg of Xanax, and reports that he ended up sleeping 15 hours. He is agreeable to try taking [name] 15 mg with 0.5 mg Xanax. He denies any acute unsafe thoughts. We agreed to reduce his Depakote further to 1000 mg at night, and he remains on the dose of Lamictal as prescribed. I spent time applauding some of his efforts, and his thoughtfulness in thinking about how he wants to modify his response to anger and frustration.  He reports that he did get in touch with the neurologist, and they confirmed benign essential tremor, and he has no other concerns at this time.  Visit Diagnosis:    ICD-9-CM ICD-10-CM   1. Chronic post-traumatic stress disorder (PTSD) 309.81 F43.12 divalproex (DEPAKOTE ER) 500 MG 24 hr tablet     venlafaxine XR (EFFEXOR-XR) 150 MG 24 hr capsule  2. Moderate episode of recurrent major depressive disorder (HCC) 296.32 F33.1   3. Psychophysiological insomnia 307.42 F51.04     Past Psychiatric History: See intake H&P for full details. Reviewed, with no updates at this time.  Past Medical History:  Past Medical History:   Diagnosis Date  . Anxiety   . Arthritis   . Bipolar 1 disorder ([place])   . Chronic pain syndrome    back  . COPD (chronic obstructive pulmonary disease) ([place])   . Gastroparesis   . GERD (gastroesophageal reflux disease)   . Hiatal hernia   . History of bladder cancer urologist-  dr Consuella Lose   papillay TCC (Ta G1)  s/p TURBT and chemo instillation 2014  . History of encephalopathy 05/27/2015   admission w/ acute encephalopathy thought to be secondary to pain meds and COPD  . History of gastric ulcer   . History of kidney stones   . History of TIA (transient ischemic attack) 2008    no residual  . History of traumatic head injury 2010   w/ LOC  per pt needed stitches  . Hyperlipidemia   . Hypothyroidism   . Insomnia    per sleep study 04-19-2015 without sleep apnea  . Neuropathy in diabetes ([place])    LOWER EXTREMITIES  . Poor historian   . Seizures, transient [facility]) neurologist-  dr Krista Blue--  differential dx complex partial seizure .vs.  mood disorder .vs.  pseudoseizure--  negative EEG's   confusion episodes and staring spells since 11/ 2015  . Transient confusion NEUOROLOGIST-  DR YAN   Episodes since 11/ 2015--  neurologist dx  differential complex partial seizure  .vs. mood disorder .  vs. pseudoseizure  . Type 2 diabetes mellitus treated with insulin ([place])   . Urinary hesitancy     Past Surgical History:  Procedure Laterality Date  . CARDIAC CATHETERIZATION  12-27-2001  DR Einar Gip  &  05-26-2009  DR Irish Lack   RESULTS FOR BOTH ARE NORMAL CORONARIES AND PERSERVED LVF/ EF 60%  . CARPAL TUNNEL RELEASE Right 09-16-2003  . CARPAL TUNNEL RELEASE Left 02/25/2015   Procedure: LEFT CARPAL TUNNEL RELEASE;  Surgeon: Leanora Cover, MD;  Location: [place];  Service: Orthopedics;  Laterality: Left;  . CYSTOSCOPY N/A 10/10/2012   Procedure: CYSTOSCOPY CLOT EVACUATION FULGERATION OF BLEEDERS ;  Surgeon: Claybon Jabs, MD;  Location: [facility];  Service: Urology;   Laterality: N/A;  . CYSTOSCOPY WITH BIOPSY N/A 11/26/2015   Procedure: CYSTOSCOPY WITH BIOPSY AND FULGURATION;  Surgeon: Kathie Rhodes, MD;  Location: [place];  Service: Urology;  Laterality: N/A;  . LAPAROSCOPIC CHOLECYSTECTOMY  11-17-2010  . NEGATIVE SLEEP STUDY  04-19-2015  in epic  . ORCHIECTOMY Right 02/21/2016   Procedure: SCROTAL ORCHIECTOMY with TESTICULAR PROSTHESIS IMPLANT;  Surgeon: Kathie Rhodes, MD;  Location: [facility];  Service: Urology;  Laterality: Right;  . ROTATOR CUFF REPAIR Right 12/2004  . TRANSURETHRAL RESECTION OF BLADDER TUMOR N/A 08/09/2012   Procedure: TRANSURETHRAL RESECTION OF BLADDER TUMOR (TURBT) WITH GYRUS WITH MITOMYCIN C;  Surgeon: Claybon Jabs, MD;  Location: [facility];  Service: Urology;  Laterality: N/A;  . TRANSURETHRAL RESECTION OF BLADDER TUMOR WITH GYRUS (TURBT-GYRUS) N/A 02/27/2014   Procedure: TRANSURETHRAL RESECTION OF BLADDER TUMOR WITH GYRUS (TURBT-GYRUS);  Surgeon: Claybon Jabs, MD;  Location: [facility];  Service: Urology;  Laterality: N/A;    Family Psychiatric History: See intake H&P for full details. Reviewed, with no updates at this time.   Family History:  Family History  Problem Relation Age of Onset  . Diabetes Mother   . Heart attack Mother   . Diabetes Father   . Hypertension Father   . Heart attack Father   . Alcohol abuse Father     Social History:  Social History   Social History  . Marital status: Married    Spouse name: N/A  . Number of children: 3  . Years of education: GED   Occupational History  . Warden/ranger of company   Social History Main Topics  . Smoking status: Current Every Day Smoker    Packs/day: 1.50    Years: 25.00    Types: Cigarettes  . Smokeless tobacco: Never Used  . Alcohol use No  . Drug use: No  . Sexual activity: Yes    Partners: Female    Birth control/ protection: None   Other Topics  Concern  . None   Social History Narrative   Lives at home with his wife and children.   Left-handed.   3-4 cups caffeine per day.    Allergies:  Allergies  Allergen Reactions  . Celebrex [Celecoxib] Anaphylaxis and Rash  . Hydrocodone Rash and Other (See Comments)    "blisters developed on arms"  . Sulfa Antibiotics Rash    Metabolic Disorder Labs: Lab Results  Component Value Date   HGBA1C 9.1 (H) 05/06/2016   MPG 214 05/06/2016   MPG 166 (H) 03/06/2014   No results found for: PROLACTIN Lab Results  Component Value Date   CHOL 277 (H) 05/06/2016   TRIG 431 (H) 05/06/2016  HDL 34 (L) 05/06/2016   CHOLHDL 8.1 05/06/2016   VLDL UNABLE TO CALCULATE IF TRIGLYCERIDE OVER 400 mg/dL 05/06/2016   LDLCALC UNABLE TO CALCULATE IF TRIGLYCERIDE OVER 400 mg/dL 05/06/2016   LDLCALC  05/27/2009    UNABLE TO CALCULATE IF TRIGLYCERIDE OVER 400 mg/dL        Total Cholesterol/HDL:CHD Risk Coronary Heart Disease Risk Table                     Men   Women  1/2 Average Risk   3.4   3.3  Average Risk       5.0   4.4  2 X Average Risk   9.6   7.1  3 X Average Risk  23.4   11.0        Use the calculated Patient Ratio above and the CHD Risk Table to determine the patient's CHD Risk.        ATP III CLASSIFICATION (LDL):  <100     mg/dL   Optimal  100-129  mg/dL   Near or Above                    Optimal  130-159  mg/dL   Borderline  160-189  mg/dL   High  >190     mg/dL   Very High     Current Medications: Current Outpatient Prescriptions  Medication Sig Dispense Refill  . ALPRAZolam (XANAX) 1 MG tablet Take 1 tablet (1 mg total) by mouth 3 (three) times daily as needed for anxiety (anger, panic). 90 tablet 0  . atorvastatin (LIPITOR) 40 MG tablet Take 1 tablet (40 mg total) by mouth daily at 6 PM. 30 tablet 1  . divalproex (DEPAKOTE ER) 500 MG 24 hr tablet Take 2 tablets (1,000 mg total) by mouth at bedtime. 90 tablet 0  . fentaNYL (DURAGESIC - DOSED MCG/HR) 75 MCG/HR Place 100  mcg onto the skin every 3 (three) days.     . folic acid (FOLVITE) 1 MG tablet Take 1 mg by mouth daily.     Marland Kitchen gabapentin (NEURONTIN) 600 MG tablet Take 1,200 mg by mouth 2 (two) times daily.     . insulin lispro protamine-lispro (HUMALOG 75/25 MIX) (75-25) 100 UNIT/ML SUSP injection Inject into the skin.    Marland Kitchen lamoTRIgine (LAMICTAL) 100 MG tablet Take 2 tablets (200 mg total) by mouth 2 (two) times daily. 360 tablet 4  . levothyroxine (SYNTHROID, LEVOTHROID) 25 MCG tablet Take 25 mcg by mouth daily before breakfast.     . Methotrexate, PF, (RASUVO) 25 MG/0.5ML SOAJ Inject 25 mg into the skin every 7 (seven) days.    . mirtazapine ([name]) 15 MG tablet Take 1 tablet (15 mg total) by mouth at bedtime. 30 tablet 2  . omeprazole (PRILOSEC) 20 MG capsule Take 20 mg by mouth every morning.     Marland Kitchen oxyCODONE-acetaminophen (PERCOCET/ROXICET) 5-325 MG tablet     . pregabalin (LYRICA) 100 MG capsule Take 100-200 mg by mouth 2 (two) times daily. Pt takes one capsule in the am and two at night.    . tadalafil (CIALIS) 20 MG tablet Take 20 mg by mouth daily as needed for erectile dysfunction.    Marland Kitchen venlafaxine XR (EFFEXOR-XR) 150 MG 24 hr capsule Take 1 capsule (150 mg total) by mouth daily with breakfast. 90 capsule 1  . clindamycin (CLEOCIN) 150 MG capsule     . hydroxychloroquine (PLAQUENIL) 200 MG tablet Take by mouth daily.    Marland Kitchen  Naloxegol Oxalate (MOVANTIK PO) Take 25 mg by mouth daily.     No current facility-administered medications for this visit.     Neurologic: Headache: Negative Seizure: Negative Paresthesias: Negative  Musculoskeletal: Strength & Muscle Tone: within normal limits Gait & Station: normal Patient leans: N/A  Psychiatric Specialty Exam: ROS  Blood pressure 130/64, pulse 75, height 6' (1.829 m), weight 225 lb (102.1 kg).Body mass index is 30.52 kg/m.  General Appearance: Casual  Eye Contact:  Good  Speech:  Clear and Coherent  Volume:  Normal  Mood:  Anxious  Affect:   Congruent  Thought Process:  Goal Directed  Orientation:  Full (Time, Place, and Person)  Thought Content: Logical   Suicidal Thoughts:  No  Homicidal Thoughts:  No  Memory:  Immediate;   Good  Judgement:  Good  Insight:  Good  Psychomotor Activity:  Normal  Concentration:  Concentration: Good  Recall:  [place] of Knowledge: NA  Language: Good  Akathisia:  Negative  Handed:  Right  AIMS (if indicated):  0  Assets:  Communication Skills Desire for Improvement Financial Resources/Insurance Housing Intimacy Social Support Talents/Skills Transportation  ADL's:  Intact  Cognition: WNL  Sleep:  7-9 hours   Treatment Plan Summary:  Vincent [name] is a 51 year old male with a history of chronic PTSD, manifesting with externalizing behaviors, in addition to depression and anxiety. He has a history of drug abuse but has been sober for the past 30 years. He presents for follow-up medication management, as he is trying to work on improving his frustration tolerance, and changing the way he response to stressors in his personal life. He does not have any acute safety issues at this time, and is well engaged in care.  1. Chronic post-traumatic stress disorder (PTSD)   2. Moderate episode of recurrent major depressive disorder ([place])   3. Psychophysiological insomnia    Increase Effexor to 150 mg XR daily Continue [name] 15 mg nightly Xanax 1 mg daily when necessary and 0.5 mg nightly for sleep when necessary Depakote decreased to 1000 mg ER Follow-up in therapy with Leanne Continue Lamictal 100 mg twice daily Follow-up with writer in 4-6 weeks  Aundra Dubin, MD 07/13/2016, 4:42 PM

## 2016-07-18 ENCOUNTER — Ambulatory Visit (HOSPITAL_COMMUNITY): Payer: Self-pay | Admitting: Licensed Clinical Social Worker

## 2016-07-19 ENCOUNTER — Ambulatory Visit (HOSPITAL_COMMUNITY): Payer: Self-pay | Admitting: Licensed Clinical Social Worker

## 2016-07-25 ENCOUNTER — Ambulatory Visit (HOSPITAL_COMMUNITY): Payer: Self-pay | Admitting: Licensed Clinical Social Worker

## 2016-08-03 ENCOUNTER — Institutional Professional Consult (permissible substitution): Payer: Self-pay | Admitting: Neurology

## 2016-08-15 ENCOUNTER — Ambulatory Visit (INDEPENDENT_AMBULATORY_CARE_PROVIDER_SITE_OTHER): Payer: 59 | Admitting: Psychiatry

## 2016-08-15 VITALS — BP 140/90 | HR 96 | Ht 72.0 in | Wt 219.6 lb

## 2016-08-15 DIAGNOSIS — F331 Major depressive disorder, recurrent, moderate: Secondary | ICD-10-CM | POA: Diagnosis not present

## 2016-08-15 DIAGNOSIS — F17210 Nicotine dependence, cigarettes, uncomplicated: Secondary | ICD-10-CM

## 2016-08-15 DIAGNOSIS — Z811 Family history of alcohol abuse and dependence: Secondary | ICD-10-CM

## 2016-08-15 DIAGNOSIS — F4312 Post-traumatic stress disorder, chronic: Secondary | ICD-10-CM | POA: Diagnosis not present

## 2016-08-15 MED ORDER — MIRTAZAPINE 30 MG PO TABS: 30.0000 mg | ORAL_TABLET | Freq: Every day | ORAL | 1 refills | Status: DC

## 2016-08-15 MED ORDER — DIVALPROEX SODIUM ER 500 MG PO TB24: 500.0000 mg | ORAL_TABLET | Freq: Every day | ORAL | 0 refills | Status: DC

## 2016-08-15 MED ORDER — ALPRAZOLAM 1 MG PO TABS: 1.0000 mg | ORAL_TABLET | Freq: Three times a day (TID) | ORAL | 0 refills | Status: DC | PRN

## 2016-08-15 NOTE — Patient Instructions (Signed)
DECREASE Depakote to 500 mg tablet at night only  Increase remeron to 30 mg tablet (new size tablet)  Continue Effexor and Lamictal as they are  Use Xanax as needed and for sleep as needed  Come back in 4-6 weeks

## 2016-08-15 NOTE — Progress Notes (Signed)
[facility] MD/PA/NP OP Progress Note  08/15/2016 4:08 PM Vincent [name]  MRN:  [id number]  Chief Complaint: better, some difficulty with sleep  Subjective:  Vincent [name] presents today for psychiatric follow-up.  He is somewhat tearful and sharing that he is finally received some positive feedback over the past few weeks from his wife and his mother-in-law. He reports that they told him that they're so impressed with how he's been handling his anger and frustration, and they can tell he's made progress. He reports that he feels very proud that he is able to be more of a peacekeeper, and he is not getting as angry and agitated as he used to. He reports that he is able to step back and see how maladaptive his family's behaviors when they're angry.  His one concern is that he continues to have insomnia, with frequent middle of the night awakenings. We discussed increasing [name] to 30 mg, and he can use Xanax when necessary as well for sleep.  We discussed tapering Depakote further to 500 mg once nightly, and continuing Effexor and Lamictal as they are. He was agreeable to that.  Denies any suicidality or unsafe thoughts. He is eager to get back to working with Joslyn Devon next week, and we provided a list of his upcoming appointments.  He expresses gratitude for all of the help, and I expressed to him that he has done an excellent job expressing his needs and sharing some of his difficulties so that he could work on these with his providers.  Visit Diagnosis:    ICD-9-CM ICD-10-CM   1. Chronic post-traumatic stress disorder (PTSD) 309.81 F43.12 mirtazapine ([name]) 30 MG tablet     ALPRAZolam (XANAX) 1 MG tablet     divalproex (DEPAKOTE ER) 500 MG 24 hr tablet  2. Moderate episode of recurrent major depressive disorder (HCC) 296.32 F33.1     Past Psychiatric History: See intake H&P for full details. Reviewed, with no updates at this time.  Past Medical History:  Past Medical History:  Diagnosis Date  .  Anxiety   . Arthritis   . Bipolar 1 disorder ([place])   . Chronic pain syndrome    back  . COPD (chronic obstructive pulmonary disease) ([place])   . Gastroparesis   . GERD (gastroesophageal reflux disease)   . Hiatal hernia   . History of bladder cancer urologist-  dr Consuella Lose   papillay TCC (Ta G1)  s/p TURBT and chemo instillation 2014  . History of encephalopathy 05/27/2015   admission w/ acute encephalopathy thought to be secondary to pain meds and COPD  . History of gastric ulcer   . History of kidney stones   . History of TIA (transient ischemic attack) 2008    no residual  . History of traumatic head injury 2010   w/ LOC  per pt needed stitches  . Hyperlipidemia   . Hypothyroidism   . Insomnia    per sleep study 04-19-2015 without sleep apnea  . Neuropathy in diabetes ([place])    LOWER EXTREMITIES  . Poor historian   . Seizures, transient [facility]) neurologist-  dr Krista Blue--  differential dx complex partial seizure .vs.  mood disorder .vs.  pseudoseizure--  negative EEG's   confusion episodes and staring spells since 11/ 2015  . Transient confusion NEUOROLOGIST-  DR YAN   Episodes since 11/ 2015--  neurologist dx  differential complex partial seizure  .vs. mood disorder . vs. pseudoseizure  . Type 2 diabetes mellitus treated with insulin (  [place])   . Urinary hesitancy     Past Surgical History:  Procedure Laterality Date  . CARDIAC CATHETERIZATION  12-27-2001  DR Einar Gip  &  05-26-2009  DR Irish Lack   RESULTS FOR BOTH ARE NORMAL CORONARIES AND PERSERVED LVF/ EF 60%  . CARPAL TUNNEL RELEASE Right 09-16-2003  . CARPAL TUNNEL RELEASE Left 02/25/2015   Procedure: LEFT CARPAL TUNNEL RELEASE;  Surgeon: Leanora Cover, MD;  Location: [place];  Service: Orthopedics;  Laterality: Left;  . CYSTOSCOPY N/A 10/10/2012   Procedure: CYSTOSCOPY CLOT EVACUATION FULGERATION OF BLEEDERS ;  Surgeon: Claybon Jabs, MD;  Location: [facility];  Service: Urology;  Laterality: N/A;  .  CYSTOSCOPY WITH BIOPSY N/A 11/26/2015   Procedure: CYSTOSCOPY WITH BIOPSY AND FULGURATION;  Surgeon: Kathie Rhodes, MD;  Location: [place];  Service: Urology;  Laterality: N/A;  . LAPAROSCOPIC CHOLECYSTECTOMY  11-17-2010  . NEGATIVE SLEEP STUDY  04-19-2015  in epic  . ORCHIECTOMY Right 02/21/2016   Procedure: SCROTAL ORCHIECTOMY with TESTICULAR PROSTHESIS IMPLANT;  Surgeon: Kathie Rhodes, MD;  Location: [facility];  Service: Urology;  Laterality: Right;  . ROTATOR CUFF REPAIR Right 12/2004  . TRANSURETHRAL RESECTION OF BLADDER TUMOR N/A 08/09/2012   Procedure: TRANSURETHRAL RESECTION OF BLADDER TUMOR (TURBT) WITH GYRUS WITH MITOMYCIN C;  Surgeon: Claybon Jabs, MD;  Location: [facility];  Service: Urology;  Laterality: N/A;  . TRANSURETHRAL RESECTION OF BLADDER TUMOR WITH GYRUS (TURBT-GYRUS) N/A 02/27/2014   Procedure: TRANSURETHRAL RESECTION OF BLADDER TUMOR WITH GYRUS (TURBT-GYRUS);  Surgeon: Claybon Jabs, MD;  Location: [facility];  Service: Urology;  Laterality: N/A;    Family Psychiatric History: See intake H&P for full details. Reviewed, with no updates at this time.   Family History:  Family History  Problem Relation Age of Onset  . Diabetes Mother   . Heart attack Mother   . Diabetes Father   . Hypertension Father   . Heart attack Father   . Alcohol abuse Father     Social History:  Social History   Social History  . Marital status: Married    Spouse name: N/A  . Number of children: 3  . Years of education: GED   Occupational History  . Warden/ranger of company   Social History Main Topics  . Smoking status: Current Every Day Smoker    Packs/day: 1.50    Years: 25.00    Types: Cigarettes  . Smokeless tobacco: Never Used  . Alcohol use No  . Drug use: No  . Sexual activity: Yes    Partners: Female    Birth control/ protection: None   Other Topics Concern  . Not on file    Social History Narrative   Lives at home with his wife and children.   Left-handed.   3-4 cups caffeine per day.    Allergies:  Allergies  Allergen Reactions  . Celebrex [Celecoxib] Anaphylaxis and Rash  . Hydrocodone Rash and Other (See Comments)    "blisters developed on arms"  . Sulfa Antibiotics Rash    Metabolic Disorder Labs: Lab Results  Component Value Date   HGBA1C 9.1 (H) 05/06/2016   MPG 214 05/06/2016   MPG 166 (H) 03/06/2014   No results found for: PROLACTIN Lab Results  Component Value Date   CHOL 277 (H) 05/06/2016   TRIG 431 (H) 05/06/2016   HDL 34 (L) 05/06/2016   CHOLHDL 8.1  05/06/2016   VLDL UNABLE TO CALCULATE IF TRIGLYCERIDE OVER 400 mg/dL 05/06/2016   LDLCALC UNABLE TO CALCULATE IF TRIGLYCERIDE OVER 400 mg/dL 05/06/2016   LDLCALC  05/27/2009    UNABLE TO CALCULATE IF TRIGLYCERIDE OVER 400 mg/dL        Total Cholesterol/HDL:CHD Risk Coronary Heart Disease Risk Table                     Men   Women  1/2 Average Risk   3.4   3.3  Average Risk       5.0   4.4  2 X Average Risk   9.6   7.1  3 X Average Risk  23.4   11.0        Use the calculated Patient Ratio above and the CHD Risk Table to determine the patient's CHD Risk.        ATP III CLASSIFICATION (LDL):  <100     mg/dL   Optimal  100-129  mg/dL   Near or Above                    Optimal  130-159  mg/dL   Borderline  160-189  mg/dL   High  >190     mg/dL   Very High     Current Medications: Current Outpatient Prescriptions  Medication Sig Dispense Refill  . ALPRAZolam (XANAX) 1 MG tablet Take 1 tablet (1 mg total) by mouth 3 (three) times daily as needed for anxiety (anger, panic). 90 tablet 0  . atorvastatin (LIPITOR) 40 MG tablet Take 1 tablet (40 mg total) by mouth daily at 6 PM. 30 tablet 1  . clindamycin (CLEOCIN) 150 MG capsule     . divalproex (DEPAKOTE ER) 500 MG 24 hr tablet Take 1 tablet (500 mg total) by mouth at bedtime. 90 tablet 0  . fentaNYL (DURAGESIC - DOSED  MCG/HR) 75 MCG/HR Place 100 mcg onto the skin every 3 (three) days.     . folic acid (FOLVITE) 1 MG tablet Take 1 mg by mouth daily.     Marland Kitchen gabapentin (NEURONTIN) 600 MG tablet Take 1,200 mg by mouth 2 (two) times daily.     . hydroxychloroquine (PLAQUENIL) 200 MG tablet Take by mouth daily.    . insulin lispro protamine-lispro (HUMALOG 75/25 MIX) (75-25) 100 UNIT/ML SUSP injection Inject into the skin.    Marland Kitchen lamoTRIgine (LAMICTAL) 100 MG tablet Take 2 tablets (200 mg total) by mouth 2 (two) times daily. 360 tablet 4  . levothyroxine (SYNTHROID, LEVOTHROID) 25 MCG tablet Take 25 mcg by mouth daily before breakfast.     . Methotrexate, PF, (RASUVO) 25 MG/0.5ML SOAJ Inject 25 mg into the skin every 7 (seven) days.    . mirtazapine ([name]) 30 MG tablet Take 1 tablet (30 mg total) by mouth at bedtime. 90 tablet 1  . Naloxegol Oxalate (MOVANTIK PO) Take 25 mg by mouth daily.    Marland Kitchen omeprazole (PRILOSEC) 20 MG capsule Take 20 mg by mouth every morning.     Marland Kitchen oxyCODONE-acetaminophen (PERCOCET/ROXICET) 5-325 MG tablet     . pregabalin (LYRICA) 100 MG capsule Take 100-200 mg by mouth 2 (two) times daily. Pt takes one capsule in the am and two at night.    . tadalafil (CIALIS) 20 MG tablet Take 20 mg by mouth daily as needed for erectile dysfunction.    Marland Kitchen venlafaxine XR (EFFEXOR-XR) 150 MG 24 hr capsule Take 1 capsule (150 mg total) by mouth  daily with breakfast. 90 capsule 1   No current facility-administered medications for this visit.     Neurologic: Headache: Negative Seizure: Negative Paresthesias: Negative  Musculoskeletal: Strength & Muscle Tone: within normal limits Gait & Station: normal Patient leans: N/A  Psychiatric Specialty Exam: ROS  Blood pressure 140/90, pulse 96, height 6' (1.829 m), weight 219 lb 9.6 oz (99.6 kg).Body mass index is 29.78 kg/m.  General Appearance: Casual  Eye Contact:  Good  Speech:  Clear and Coherent  Volume:  Normal  Mood:  Euthymic and much more calm,  less irritable  Affect:  Congruent  Thought Process:  Goal Directed  Orientation:  Full (Time, Place, and Person)  Thought Content: Logical   Suicidal Thoughts:  No  Homicidal Thoughts:  No  Memory:  Immediate;   Good  Judgement:  Good  Insight:  Good  Psychomotor Activity:  Normal  Concentration:  Concentration: Good  Recall:  [place] of Knowledge: NA  Language: Good  Akathisia:  Negative  Handed:  Right  AIMS (if indicated):  0  Assets:  Communication Skills Desire for Improvement Financial Resources/Insurance Housing Intimacy Social Support Talents/Skills Transportation  ADL's:  Intact  Cognition: WNL  Sleep:  7 hours, middle night awakenings   Treatment Plan Summary:  DELMUS WARWICK is a 51 year old male with a history of chronic PTSD, manifesting with externalizing behaviors, in addition to depression and anxiety. He has a history of drug abuse but has been sober for the past 30 years.   He continues to make significant progress and has received positive feedback from his family. We are nearing the end of our taper with Depakote, and we will up titrate [name] to further capture some sleep benefits and antidepressant effects.  1. Chronic post-traumatic stress disorder (PTSD)   2. Moderate episode of recurrent major depressive disorder (HCC)    Continue Effexor 150 mg XR daily Increase [name] 30 mg nightly Continue Lamictal 100 mg twice daily Xanax 1 mg daily prn, and 0.5 mg nightly for sleep when necessary Depakote decreased to 500 mg ER Follow-up in therapy with Leanne Follow-up with writer in 4-6 weeks  Aundra Dubin, MD 08/15/2016, 4:08 PM

## 2016-08-21 ENCOUNTER — Ambulatory Visit (HOSPITAL_COMMUNITY): Payer: Self-pay | Admitting: Psychology

## 2016-08-28 ENCOUNTER — Ambulatory Visit (HOSPITAL_COMMUNITY): Payer: Self-pay | Admitting: Psychology

## 2016-08-28 ENCOUNTER — Encounter (HOSPITAL_COMMUNITY): Payer: Self-pay | Admitting: Psychology

## 2016-08-28 NOTE — Progress Notes (Signed)
Vincent [name] is a 51 y.o. male patient who didn't show for appointment.  Letter sent.        [name], LPC

## 2016-08-31 DIAGNOSIS — G8929 Other chronic pain: Secondary | ICD-10-CM | POA: Diagnosis not present

## 2016-09-04 ENCOUNTER — Ambulatory Visit (INDEPENDENT_AMBULATORY_CARE_PROVIDER_SITE_OTHER): Payer: 59 | Admitting: Psychology

## 2016-09-04 DIAGNOSIS — F331 Major depressive disorder, recurrent, moderate: Secondary | ICD-10-CM

## 2016-09-04 DIAGNOSIS — F4310 Post-traumatic stress disorder, unspecified: Secondary | ICD-10-CM

## 2016-09-04 DIAGNOSIS — F329 Major depressive disorder, single episode, unspecified: Secondary | ICD-10-CM

## 2016-09-04 DIAGNOSIS — F4312 Post-traumatic stress disorder, chronic: Secondary | ICD-10-CM

## 2016-09-04 NOTE — Progress Notes (Signed)
   THERAPIST PROGRESS NOTE  Session Time: 1.30pm-2.15pm  Participation Level: Active  Behavioral Response: Well GroomedAlertDepressed  Type of Therapy: Individual Therapy  Treatment Goals addressed: Diagnosis: MDD and goal 1.  Interventions: CBT and Assertiveness Training  Summary: Vincent [name] is a 51 y.o. male who presents with report of depressed mood over past 1-2 weeks.  Pt reported that he has been more depressed, not wanting to be around others and difficulty getting out of bed.  Pt reported that no identifiable triggers or precipitating incident.  Pt reports that he actually has been less conflict at home- pt has been more aware of own signs of escalating and removing self to calm. Pt identified walking dogs as positive and ideas for getting out of bed and engaging.  Pt reports that he has been wanting for more interactions w/ his extended family- but that wife is hesitant for him to be away for concern of him.  Pt discussed how this usually has turned into argument.  Pt increased awareness of ways of expressing his wants w/out demanding his way and working towards compromises.   Suicidal/Homicidal: Nowithout intent/plan  Therapist Response: Assessed pt current functioning per pt [email or web address]  Processed w/pt his depressed mood- discussed pt thoughts and behaviors. Explored w/pt self care towards less depressed mood.  Processed w/ pt conflict and how to assert w/out demands or increasing escalating.    Plan: Return again in 2 weeks.  Diagnosis: MDD, PTSD    Vincent [name],Vincent [name], [place] 09/04/2016

## 2016-09-11 ENCOUNTER — Ambulatory Visit (INDEPENDENT_AMBULATORY_CARE_PROVIDER_SITE_OTHER): Payer: 59 | Admitting: Psychology

## 2016-09-11 DIAGNOSIS — F4310 Post-traumatic stress disorder, unspecified: Secondary | ICD-10-CM | POA: Diagnosis not present

## 2016-09-11 DIAGNOSIS — F4312 Post-traumatic stress disorder, chronic: Secondary | ICD-10-CM

## 2016-09-11 DIAGNOSIS — F329 Major depressive disorder, single episode, unspecified: Secondary | ICD-10-CM | POA: Diagnosis not present

## 2016-09-11 DIAGNOSIS — F331 Major depressive disorder, recurrent, moderate: Secondary | ICD-10-CM

## 2016-09-11 NOTE — Progress Notes (Signed)
   THERAPIST PROGRESS NOTE  Session Time: 2.30pm-3.10pm  Participation Level: Active  Behavioral Response: Well GroomedAlertDepressed  Type of Therapy: Individual Therapy  Treatment Goals addressed: Diagnosis: MDD, PTSD and goal 1.  Interventions: CBT and Supportive  Summary: Vincent [name] is a 51 y.o. male who presents with report of improved mood today- but that till yesterday he was severely depressed in the bed-not leaving the room for 3 days.  Pt reported that he didn't have any SI- just wanted to be left alone.  Pt reported that yesterday he did get out to church had a panic attack on way there- still went and returned in evening for activities, then out w/ wife for dinner. Pt reported that those things were positive and then today w/ 3 close friends from church went to social activity in community and had a very good time.  Pt affect is brighter today.  pt reported on thoughts of dad and dad's death when depressed.  Pt identified things to be mindful of and reaching out to supports.  Pt reported that still doing well w/ removing self when escalating and returning to resolve when deescalated.  Sees family also changing to recognize this as support.  Suicidal/Homicidal: Nowithout intent/plan  Therapist Response: Assessed pt current functioning per pt report. Processed w/pt coping w/ depression and things that were helpful to assist in brightening affect.  Reflected positive supports and engaging in activities outside of home.  Explored w/pt any depressive thoughts and how to challenge and reframe.   Plan: Return again in 1 weeks.  Diagnosis: MDD, PTSD    Kyli Sorter, [place] 09/11/2016

## 2016-09-18 ENCOUNTER — Encounter (HOSPITAL_COMMUNITY): Payer: Self-pay | Admitting: Psychology

## 2016-09-18 ENCOUNTER — Ambulatory Visit (HOSPITAL_COMMUNITY): Payer: Self-pay | Admitting: Psychology

## 2016-09-18 NOTE — Progress Notes (Signed)
Vincent [name] is a 51 y.o. male patient who didn't show for appointment.  Letter sent.        [name], LPC

## 2016-09-25 ENCOUNTER — Ambulatory Visit (INDEPENDENT_AMBULATORY_CARE_PROVIDER_SITE_OTHER): Payer: 59 | Admitting: Psychology

## 2016-09-25 DIAGNOSIS — F331 Major depressive disorder, recurrent, moderate: Secondary | ICD-10-CM | POA: Diagnosis not present

## 2016-09-25 NOTE — Progress Notes (Signed)
   THERAPIST PROGRESS NOTE  Session Time: 1.30pm-2.10pm  Participation Level: Active  Behavioral Response: Well GroomedAlertaffect wnl  Type of Therapy: Individual Therapy  Treatment Goals addressed: Diagnosis: MDD and goal 1.  Interventions: CBT and Supportive  Summary: KAYLEM GIDNEY is a 51 y.o. male who presents with affect WNl.  Pt reports mood has been ok this past week- interactions w/ family have been good.  Pt reports couple incidents of being angry but able to keep his anger under control.  Pt reported that he has been waking in he middle of the night according to his wife telling her to get up late for work or need to buy milk at the store- but has no recollection of this.  Pt reports wife able to get him to go back to sleep- but he reports feels that he has slept well next morning.  Pt discussed decision he and family are making to switch churches.  Pt reports they have been visiting another church- wife and daughter want the change- he feels connected w/ men at current church and doesn't want to lose this support if changes- afraid he will.  Pt has been able to communicate his thoughts and feelings w/ wife and discussed that he wants to talk w/ his group of men next.    Suicidal/Homicidal: Nowithout intent/plan  Therapist Response: Assessed pt current functioning per pt report.  Processed w/pt interactions, mood and decision re: changing  Churches.  Validated his feelings and discussed his wants for proceeding and encouraged effective communication.   Plan: Return again in 2 weeks.  Diagnosis: MDD   [name], [facility] 09/25/2016

## 2016-09-26 DIAGNOSIS — M5416 Radiculopathy, lumbar region: Secondary | ICD-10-CM | POA: Diagnosis not present

## 2016-09-26 DIAGNOSIS — M5136 Other intervertebral disc degeneration, lumbar region: Secondary | ICD-10-CM | POA: Diagnosis not present

## 2016-09-26 DIAGNOSIS — G894 Chronic pain syndrome: Secondary | ICD-10-CM | POA: Diagnosis not present

## 2016-09-26 DIAGNOSIS — E0849 Diabetes mellitus due to underlying condition with other diabetic neurological complication: Secondary | ICD-10-CM | POA: Diagnosis not present

## 2016-09-26 DIAGNOSIS — Z79899 Other long term (current) drug therapy: Secondary | ICD-10-CM | POA: Diagnosis not present

## 2016-09-26 DIAGNOSIS — Z5181 Encounter for therapeutic drug level monitoring: Secondary | ICD-10-CM | POA: Diagnosis not present

## 2016-09-29 DIAGNOSIS — H10023 Other mucopurulent conjunctivitis, bilateral: Secondary | ICD-10-CM | POA: Diagnosis not present

## 2016-10-04 ENCOUNTER — Ambulatory Visit (INDEPENDENT_AMBULATORY_CARE_PROVIDER_SITE_OTHER): Payer: 59 | Admitting: Psychiatry

## 2016-10-04 ENCOUNTER — Encounter (HOSPITAL_COMMUNITY): Payer: Self-pay | Admitting: Psychiatry

## 2016-10-04 VITALS — BP 138/74 | HR 80 | Ht 72.0 in | Wt 220.0 lb

## 2016-10-04 DIAGNOSIS — F17210 Nicotine dependence, cigarettes, uncomplicated: Secondary | ICD-10-CM

## 2016-10-04 DIAGNOSIS — F5104 Psychophysiologic insomnia: Secondary | ICD-10-CM | POA: Diagnosis not present

## 2016-10-04 DIAGNOSIS — Z811 Family history of alcohol abuse and dependence: Secondary | ICD-10-CM

## 2016-10-04 DIAGNOSIS — F331 Major depressive disorder, recurrent, moderate: Secondary | ICD-10-CM

## 2016-10-04 DIAGNOSIS — G4730 Sleep apnea, unspecified: Secondary | ICD-10-CM

## 2016-10-04 DIAGNOSIS — G4750 Parasomnia, unspecified: Secondary | ICD-10-CM

## 2016-10-04 DIAGNOSIS — F4312 Post-traumatic stress disorder, chronic: Secondary | ICD-10-CM

## 2016-10-04 DIAGNOSIS — G3184 Mild cognitive impairment, so stated: Secondary | ICD-10-CM | POA: Diagnosis not present

## 2016-10-04 MED ORDER — GABAPENTIN 600 MG PO TABS: 1200.0000 mg | ORAL_TABLET | Freq: Two times a day (BID) | ORAL | 3 refills | Status: DC

## 2016-10-04 MED ORDER — ALPRAZOLAM 1 MG PO TABS: 1.0000 mg | ORAL_TABLET | Freq: Three times a day (TID) | ORAL | 2 refills | Status: DC | PRN

## 2016-10-04 NOTE — Progress Notes (Signed)
BH MD/PA/NP OP Progress Note  10/04/2016 4:42 PM Vincent [name]  MRN:  [id number]  Chief Complaint:  Chief Complaint    Follow-up    med management  Subjective:  Vincent [name] [facility] presents for med management follow-up. He reports that he continues to do very well in trying to control his anger and temper. He has not had nearly as many episodes of anger and rage, and reports that he is better able to ask for quiet time and able to come back to discussions with his wife if they're having a conflict. He reports that his wife has given him significant amount of feedback, and he feels very proud of this. He continues to be very grateful that he's sought out help and continues to work with his therapist Joslyn Devon in this clinic.  Spent time discussing some of his concerns about sleep, parasomnia symptoms or sleep talking and sleepwalking, and some of the memory issues he has had recently. He reports that he has had some difficulty with misplacing his keys, forgetting where he is going when he is on the road, and even getting lost going to local stores at times. He reports this pretty unusual for him and he is noticed this build up over the past 6 months to year. I spent time talking with him about mild cognitive decline, which would be unusual at his age, and also the potential that sleep apnea is contributing to his parasomnia and daytime sleepiness. He does have a positive history of snoring and disrupted sleep cycles based on his prior sleep studies.    He was agreeable to a repeat sleep study, and also agreeable to a referral for neuropsychological testing for baseline cognition and frontal assessments. He does take gabapentin nightly, which can help with parasomnia symptoms, so he agreed to make sure he refills this and resumes this therapy. He denies any acute safety issues and agrees to follow-up in 3 months.  Regarding medications, we agreed to discontinue Depakote, and he had taken his last dose  yesterday. He continues on Effexor, Xanax, Lamictal, and [name].  Visit Diagnosis:    ICD-10-CM   1. Moderate episode of recurrent major depressive disorder (HCC) F33.1 gabapentin (NEURONTIN) 600 MG tablet  2. Chronic post-traumatic stress disorder (PTSD) F43.12 gabapentin (NEURONTIN) 600 MG tablet    ALPRAZolam (XANAX) 1 MG tablet  3. Psychophysiological insomnia F51.04 gabapentin (NEURONTIN) 600 MG tablet  4. Parasomnia G47.50 Polysomnography 4 or more parameters  5. Sleep-disordered breathing G47.30 Polysomnography 4 or more parameters  6. Mild cognitive impairment G31.84 Neuropsychological testing    Past Psychiatric History: See intake H&P for full details. Reviewed, with no updates at this time.  Past Medical History:  Past Medical History:  Diagnosis Date  . Anxiety   . Arthritis   . Bipolar 1 disorder ([place])   . Chronic pain syndrome    back  . COPD (chronic obstructive pulmonary disease) ([place])   . Gastroparesis   . GERD (gastroesophageal reflux disease)   . Hiatal hernia   . History of bladder cancer urologist-  dr Consuella Lose   papillay TCC (Ta G1)  s/p TURBT and chemo instillation 2014  . History of encephalopathy 05/27/2015   admission w/ acute encephalopathy thought to be secondary to pain meds and COPD  . History of gastric ulcer   . History of kidney stones   . History of TIA (transient ischemic attack) 2008    no residual  . History of traumatic head injury 2010  w/ LOC  per pt needed stitches  . Hyperlipidemia   . Hypothyroidism   . Insomnia    per sleep study 04-19-2015 without sleep apnea  . Neuropathy in diabetes ([place])    LOWER EXTREMITIES  . Poor historian   . Seizures, transient [facility]) neurologist-  dr Krista Blue--  differential dx complex partial seizure .vs.  mood disorder .vs.  pseudoseizure--  negative EEG's   confusion episodes and staring spells since 11/ 2015  . Transient confusion NEUOROLOGIST-  DR YAN   Episodes since 11/ 2015--  neurologist dx   differential complex partial seizure  .vs. mood disorder . vs. pseudoseizure  . Type 2 diabetes mellitus treated with insulin ([place])   . Urinary hesitancy     Past Surgical History:  Procedure Laterality Date  . CARDIAC CATHETERIZATION  12-27-2001  DR Einar Gip  &  05-26-2009  DR Irish Lack   RESULTS FOR BOTH ARE NORMAL CORONARIES AND PERSERVED LVF/ EF 60%  . CARPAL TUNNEL RELEASE Right 09-16-2003  . CARPAL TUNNEL RELEASE Left 02/25/2015   Procedure: LEFT CARPAL TUNNEL RELEASE;  Surgeon: Leanora Cover, MD;  Location: [place];  Service: Orthopedics;  Laterality: Left;  . CYSTOSCOPY N/A 10/10/2012   Procedure: CYSTOSCOPY CLOT EVACUATION FULGERATION OF BLEEDERS ;  Surgeon: Claybon Jabs, MD;  Location[facility];  Service: Urology;  Laterality: N/A;  . CYSTOSCOPY WITH BIOPSY N/A 11/26/2015   Procedure: CYSTOSCOPY WITH BIOPSY AND FULGURATION;  Surgeon: Kathie Rhodes, MD;  Location: [place];  Service: Urology;  Laterality: N/A;  . LAPAROSCOPIC CHOLECYSTECTOMY  11-17-2010  . NEGATIVE SLEEP STUDY  04-19-2015  in epic  . ORCHIECTOMY Right 02/21/2016   Procedure: SCROTAL ORCHIECTOMY with TESTICULAR PROSTHESIS IMPLANT;  Surgeon: Kathie Rhodes, MD;  Location: [facility];  Service: Urology;  Laterality: Right;  . ROTATOR CUFF REPAIR Right 12/2004  . TRANSURETHRAL RESECTION OF BLADDER TUMOR N/A 08/09/2012   Procedure: TRANSURETHRAL RESECTION OF BLADDER TUMOR (TURBT) WITH GYRUS WITH MITOMYCIN C;  Surgeon: Claybon Jabs, MD;  Location: [facility];  Service: Urology;  Laterality: N/A;  . TRANSURETHRAL RESECTION OF BLADDER TUMOR WITH GYRUS (TURBT-GYRUS) N/A 02/27/2014   Procedure: TRANSURETHRAL RESECTION OF BLADDER TUMOR WITH GYRUS (TURBT-GYRUS);  Surgeon: Claybon Jabs, MD;  Location[facility];  Service: Urology;  Laterality: N/A;    Family Psychiatric History: See intake H&P for full details. Reviewed, with no  updates at this time.   Family History:  Family History  Problem Relation Age of Onset  . Diabetes Mother   . Heart attack Mother   . Diabetes Father   . Hypertension Father   . Heart attack Father   . Alcohol abuse Father     Social History:  Social History   Social History  . Marital status: Married    Spouse name: N/A  . Number of children: 3  . Years of education: GED   Occupational History  . Warden/ranger of company   Social History Main Topics  . Smoking status: Current Every Day Smoker    Packs/day: 1.50    Years: 25.00    Types: Cigarettes  . Smokeless tobacco: Never Used     Comment: Has cut back to 1 pack a day  . Alcohol use No  . Drug use: No  . Sexual activity: Yes    Partners: Female    Birth control/ protection: None   Other Topics Concern  .  None   Social History Narrative   Lives at home with his wife and children.   Left-handed.   3-4 cups caffeine per day.    Allergies:  Allergies  Allergen Reactions  . Celebrex [Celecoxib] Anaphylaxis and Rash  . Hydrocodone Rash and Other (See Comments)    "blisters developed on arms"  . Sulfa Antibiotics Rash    Metabolic Disorder Labs: Lab Results  Component Value Date   HGBA1C 9.1 (H) 05/06/2016   MPG 214 05/06/2016   MPG 166 (H) 03/06/2014   No results found for: PROLACTIN Lab Results  Component Value Date   CHOL 277 (H) 05/06/2016   TRIG 431 (H) 05/06/2016   HDL 34 (L) 05/06/2016   CHOLHDL 8.1 05/06/2016   VLDL UNABLE TO CALCULATE IF TRIGLYCERIDE OVER 400 mg/dL 05/06/2016   LDLCALC UNABLE TO CALCULATE IF TRIGLYCERIDE OVER 400 mg/dL 05/06/2016   LDLCALC  05/27/2009    UNABLE TO CALCULATE IF TRIGLYCERIDE OVER 400 mg/dL        Total Cholesterol/HDL:CHD Risk Coronary Heart Disease Risk Table                     Men   Women  1/2 Average Risk   3.4   3.3  Average Risk       5.0   4.4  2 X Average Risk   9.6   7.1  3 X Average Risk  23.4   11.0        Use  the calculated Patient Ratio above and the CHD Risk Table to determine the patient's CHD Risk.        ATP III CLASSIFICATION (LDL):  <100     mg/dL   Optimal  100-129  mg/dL   Near or Above                    Optimal  130-159  mg/dL   Borderline  160-189  mg/dL   High  >190     mg/dL   Very High     Current Medications: Current Outpatient Prescriptions  Medication Sig Dispense Refill  . ALPRAZolam (XANAX) 1 MG tablet Take 1 tablet (1 mg total) by mouth 3 (three) times daily as needed for anxiety (anger, panic). 90 tablet 2  . atorvastatin (LIPITOR) 40 MG tablet Take 1 tablet (40 mg total) by mouth daily at 6 PM. 30 tablet 1  . clindamycin (CLEOCIN) 150 MG capsule     . folic acid (FOLVITE) 1 MG tablet Take 1 mg by mouth daily.     Marland Kitchen gabapentin (NEURONTIN) 600 MG tablet Take 2 tablets (1,200 mg total) by mouth 2 (two) times daily. 120 tablet 3  . hydroxychloroquine (PLAQUENIL) 200 MG tablet Take by mouth daily.    . insulin lispro protamine-lispro (HUMALOG 75/25 MIX) (75-25) 100 UNIT/ML SUSP injection Inject into the skin.    Marland Kitchen lamoTRIgine (LAMICTAL) 100 MG tablet Take 2 tablets (200 mg total) by mouth 2 (two) times daily. 360 tablet 4  . levothyroxine (SYNTHROID, LEVOTHROID) 25 MCG tablet Take 25 mcg by mouth daily before breakfast.     . mirtazapine ([name]) 30 MG tablet Take 1 tablet (30 mg total) by mouth at bedtime. 90 tablet 1  . Naloxegol Oxalate (MOVANTIK PO) Take 25 mg by mouth daily.    Marland Kitchen omeprazole (PRILOSEC) 20 MG capsule Take 20 mg by mouth every morning.     . pregabalin (LYRICA) 100 MG capsule Take 100-200 mg by mouth 2 (  two) times daily. Pt takes one capsule in the am and two at night.    . tadalafil (CIALIS) 20 MG tablet Take 20 mg by mouth daily as needed for erectile dysfunction.    Marland Kitchen venlafaxine XR (EFFEXOR-XR) 150 MG 24 hr capsule Take 1 capsule (150 mg total) by mouth daily with breakfast. 90 capsule 1  . fentaNYL (DURAGESIC - DOSED MCG/HR) 75 MCG/HR Place 100 mcg  onto the skin every 3 (three) days.     . Methotrexate, PF, (RASUVO) 25 MG/0.5ML SOAJ Inject 25 mg into the skin every 7 (seven) days.    Marland Kitchen oxyCODONE-acetaminophen (PERCOCET/ROXICET) 5-325 MG tablet      No current facility-administered medications for this visit.     Neurologic: Headache: Negative Seizure: Negative Paresthesias: Negative  Musculoskeletal: Strength & Muscle Tone: within normal limits Gait & Station: normal Patient leans: N/A  Psychiatric Specialty Exam: ROS  Blood pressure 138/74, pulse 80, height 6' (1.829 m), weight 220 lb (99.8 kg).Body mass index is 29.84 kg/m.  General Appearance: Casual  Eye Contact:  Good  Speech:  Clear and Coherent  Volume:  Normal  Mood:  Euthymic  Affect:  Appropriate, Blunt and Congruent  Thought Process:  Coherent and Goal Directed  Orientation:  Full (Time, Place, and Person)  Thought Content: Logical   Suicidal Thoughts:  No  Homicidal Thoughts:  No  Memory:  Immediate;   Fair  Judgement:  Fair  Insight:  Fair  Psychomotor Activity:  Normal  Concentration:  Concentration: Good  Recall:  Good  Fund of Knowledge: Good  Language: Good  Akathisia:  Negative  Handed:  Right  AIMS (if indicated):  0  Assets:  Communication Skills Desire for Improvement Financial Resources/Insurance Housing Intimacy Social Support Talents/Skills Transportation  ADL's:  Intact  Cognition: WNL  Sleep:  6-8 hours    Treatment Plan Summary: Vincent [name] is a 51 year old man with multiple medical problems and a history of chronic PTSD and depression. He has made tremendous strides in working with his therapist in this office, and has been able to make significant changes in how he handles conflict and interpersonal stressors. We continue his current medications as below, and we'll proceed with a sleep study and neuropsychological testing to investigate some of the memory difficulties and parasomnia experiences he has had increasing  lately.  1. Moderate episode of recurrent major depressive disorder ([place])   2. Chronic post-traumatic stress disorder (PTSD)   3. Psychophysiological insomnia   4. Parasomnia   5. Sleep-disordered breathing   6. Mild cognitive impairment    Continue Effexor 150 mg XR daily Continue [name] 30 mg nightly Continue Xanax 1 mg 2-3 times per day for anxiety and agitation Continue in therapy with Leanne Follow-up in clinic in 3 months Discontinue Depakote Referral for polysomnography and neuropsych testing Continue Lamictal and gabapentin as prescribed   Aundra Dubin, MD 10/04/2016, 4:42 PM

## 2016-10-30 ENCOUNTER — Ambulatory Visit (HOSPITAL_COMMUNITY): Payer: Self-pay | Admitting: Psychology

## 2016-10-31 ENCOUNTER — Ambulatory Visit (HOSPITAL_COMMUNITY): Payer: Medicare Other | Admitting: Psychology

## 2016-10-31 ENCOUNTER — Encounter (HOSPITAL_COMMUNITY): Payer: Self-pay | Admitting: Psychology

## 2016-10-31 NOTE — Progress Notes (Signed)
Vincent [name] is a 51 y.o. male patient who called to cancel his appointment today.  Pt next appt is scheduled for 11/14/16.        [name], LPC

## 2016-11-14 ENCOUNTER — Ambulatory Visit (INDEPENDENT_AMBULATORY_CARE_PROVIDER_SITE_OTHER): Payer: 59 | Admitting: Psychology

## 2016-11-14 DIAGNOSIS — F4310 Post-traumatic stress disorder, unspecified: Secondary | ICD-10-CM | POA: Diagnosis not present

## 2016-11-14 DIAGNOSIS — F331 Major depressive disorder, recurrent, moderate: Secondary | ICD-10-CM

## 2016-11-14 DIAGNOSIS — F4312 Post-traumatic stress disorder, chronic: Secondary | ICD-10-CM

## 2016-11-14 DIAGNOSIS — F329 Major depressive disorder, single episode, unspecified: Secondary | ICD-10-CM

## 2016-11-14 NOTE — Progress Notes (Signed)
   THERAPIST PROGRESS NOTE  Session Time: 12.33pm-1.25pm  Participation Level: Active  Behavioral Response: Well GroomedAlertAnxious  Type of Therapy: Individual Therapy  Treatment Goals addressed: Diagnosis: MDD, PTSD and goal 1.  Interventions: CBT and Supportive  Summary: Vincent [name] is a 51 y.o. male who presents with psychomotor restlessness. Pt reports "not doing well" since last seen.  Pt reported that started feeling easily irritable again, anxious, paranoid.  Pt reported not sleeping well- will wake and find self outside house in neighborhood naked standing w/ baseball bat on neighbors porch.  Pt reported that past 4 days has not spoke to wife or daughter.  Pt reports paranoid that everyone is out to get him. Pt reports won't drink a drink that has been opened by by his wife- not sure if poisoning.  Pt acknowledges that this is not rational and actually helps and cares for him.  Pt isn't able to identify any factors that contribution to change.  Pt denies any SI or HI. Pt plans for safety w/ counselor- being sure to remove access to any potential weapons.  Pt agrees to call counselor or seek assessment w/ any further deterioration.  Pt agrees to grounding statements and self care.  Suicidal/Homicidal: Nowithout intent/plan  Therapist Response: assessed pt current functioning per pt report.  Explored w/pt recent struggles and paranoia.  Discussed pt concerns and assessed for safety.  Discussed grounding practices and self care.  Informed Dr. Daron Offer of change in symptoms.  Plan: Return again in 1 week.  Dr. Daron Offer informed he will call to talk to pt.   Diagnosis: MDD, PTSD    Mann Skaggs, [place] 11/14/2016

## 2016-11-20 ENCOUNTER — Ambulatory Visit (INDEPENDENT_AMBULATORY_CARE_PROVIDER_SITE_OTHER): Payer: 59 | Admitting: Psychology

## 2016-11-20 DIAGNOSIS — F4312 Post-traumatic stress disorder, chronic: Secondary | ICD-10-CM

## 2016-11-20 DIAGNOSIS — F4310 Post-traumatic stress disorder, unspecified: Secondary | ICD-10-CM | POA: Diagnosis not present

## 2016-11-20 DIAGNOSIS — F331 Major depressive disorder, recurrent, moderate: Secondary | ICD-10-CM

## 2016-11-20 DIAGNOSIS — F329 Major depressive disorder, single episode, unspecified: Secondary | ICD-10-CM

## 2016-11-20 NOTE — Progress Notes (Signed)
   THERAPIST PROGRESS NOTE  Session Time: 12.30pm-1.05pm  Participation Level: Active  Behavioral Response: Well GroomedAlertDepressed  Type of Therapy: Individual Therapy  Treatment Goals addressed: Diagnosis: MDD, PTSD and goal 1.  Interventions: Supportive and Other: grounding  Summary: DYRELL TUCCILLO is a 51 y.o. male who presents with report of some improvement since last week.  Pt reported he has slept a little better some nights and not sleepwalking.  Pt reported that he didn't sleep last night however and is tired today.  Pt reported that he is still dealing w/ significant paranoia and at time hears door bell, phone ring, someone call his name but no one there.  Pt denies any SI or HI.  Pt is aware that paranoia and hearing sounds are symptoms.  Pt agrees to focus on self care, grounding, eating/sleep hygiene and call as needed. .   Suicidal/Homicidal: Nowithout intent/plan  Therapist Response: Assessed pt current functioning per pt report. Explored w/pt his sleep, interactions and activities.  Encouraged wellness/self care measures.  Discussed grounding techniques and walked through in session.   Plan: Return again in 1 weeks. Pt to attend 11/22/16 appointment w/ Dr. Daron Offer. Pt to call as needed or seek crisis services if any SI/HI.   Diagnosis: MDD, PTSD    YATES,LEANNE, [place] 11/20/2016

## 2016-11-22 ENCOUNTER — Encounter (HOSPITAL_COMMUNITY): Payer: Self-pay | Admitting: Psychiatry

## 2016-11-22 ENCOUNTER — Ambulatory Visit (INDEPENDENT_AMBULATORY_CARE_PROVIDER_SITE_OTHER): Payer: 59 | Admitting: Psychiatry

## 2016-11-22 ENCOUNTER — Other Ambulatory Visit (HOSPITAL_COMMUNITY): Payer: Self-pay

## 2016-11-22 DIAGNOSIS — F5104 Psychophysiologic insomnia: Secondary | ICD-10-CM | POA: Diagnosis not present

## 2016-11-22 DIAGNOSIS — E538 Deficiency of other specified B group vitamins: Secondary | ICD-10-CM

## 2016-11-22 DIAGNOSIS — F331 Major depressive disorder, recurrent, moderate: Secondary | ICD-10-CM

## 2016-11-22 DIAGNOSIS — F17210 Nicotine dependence, cigarettes, uncomplicated: Secondary | ICD-10-CM | POA: Diagnosis not present

## 2016-11-22 DIAGNOSIS — G479 Sleep disorder, unspecified: Secondary | ICD-10-CM

## 2016-11-22 DIAGNOSIS — D508 Other iron deficiency anemias: Secondary | ICD-10-CM

## 2016-11-22 DIAGNOSIS — F4312 Post-traumatic stress disorder, chronic: Secondary | ICD-10-CM

## 2016-11-22 DIAGNOSIS — Z811 Family history of alcohol abuse and dependence: Secondary | ICD-10-CM | POA: Diagnosis not present

## 2016-11-22 DIAGNOSIS — F5105 Insomnia due to other mental disorder: Secondary | ICD-10-CM

## 2016-11-22 DIAGNOSIS — R5383 Other fatigue: Secondary | ICD-10-CM

## 2016-11-22 DIAGNOSIS — E559 Vitamin D deficiency, unspecified: Secondary | ICD-10-CM

## 2016-11-22 MED ORDER — ARIPIPRAZOLE 2 MG PO TABS: 2.0000 mg | ORAL_TABLET | Freq: Every day | ORAL | 1 refills | Status: DC

## 2016-11-22 MED ORDER — ALPRAZOLAM 0.5 MG PO TABS: 0.5000 mg | ORAL_TABLET | Freq: Three times a day (TID) | ORAL | 1 refills | Status: DC | PRN

## 2016-11-22 MED ORDER — TRAZODONE HCL 100 MG PO TABS: 100.0000 mg | ORAL_TABLET | Freq: Every day | ORAL | 1 refills | Status: DC

## 2016-11-22 MED ORDER — GABAPENTIN 600 MG PO TABS: 1200.0000 mg | ORAL_TABLET | Freq: Two times a day (BID) | ORAL | 3 refills | Status: DC

## 2016-11-22 MED ORDER — VENLAFAXINE HCL ER 150 MG PO CP24: 300.0000 mg | ORAL_CAPSULE | Freq: Every day | ORAL | 1 refills | Status: DC

## 2016-11-22 NOTE — Patient Instructions (Signed)
Increase Effexor to 300 mg ( 2 capsules in the morning)  START Abilify 2 mg in the morning for paranoia - this lasts about 24-36 hours, take every day  CONTINUE [name] at night   START Trazodone 100 mg at night for sleep  CONTINUE Gabapentin twice a day  DECREASE Xanax to 0.5 mg 3 times a day as needed (use as a last resort)

## 2016-11-22 NOTE — Progress Notes (Signed)
BH MD/PA/NP OP Progress Note  11/22/2016 11:19 AM Vincent [name]  MRN:  [id number]  Chief Complaint:  depressed, paranoid  HPI: Vincent [name] [facility] presents for med management follow-up for depression, and reports that he has had some increase in paranoia, depression, and increased irritability. He has not had any violent acting out. He reports that over the past 1-2 months, he has noted he has been more paranoid and distrusting and interpersonal relationships, and reports that he's been paranoid that people are putting things in his food. He recognizes that this is unreasonable, and he has no specific reason to believe this. He does not present as psychotic or grossly disorganized. He does not present with symptoms of mania. He reports that these symptoms started to coincide with his mood becoming worse. We spent time discussing pharmacologic intervention including increase of Effexor to 300 mg, and augmentation with Abilify 2 mg daily. We also discussed decreasing Xanax to 0.5 mg 2-3 times per day, given that it's habit-forming, and I'd like to see him gradually come off of this in the long-term. He reports that he has also had trouble sleeping, and spent 1 or 2 nights awake sitting on the couch watching TV because he couldn't fall asleep. We discussed adding trazodone to his regimen at a dose of 100 mg nightly to help with his insomnia symptoms. He has a prior sleep study which was negative for sleep apnea. We agreed to follow up sooner in 6-8 weeks. The referral for neuropsych testing has yet to be scheduled. He continues in individual therapy.  Visit Diagnosis:    ICD-10-CM   1. Chronic post-traumatic stress disorder (PTSD) F43.12 ALPRAZolam (XANAX) 0.5 MG tablet    traZODone (DESYREL) 100 MG tablet    ARIPiprazole (ABILIFY) 2 MG tablet    gabapentin (NEURONTIN) 600 MG tablet    venlafaxine XR (EFFEXOR-XR) 150 MG 24 hr capsule  2. Moderate episode of recurrent major depressive disorder (HCC) F33.1  gabapentin (NEURONTIN) 600 MG tablet  3. Psychophysiological insomnia F51.04 gabapentin (NEURONTIN) 600 MG tablet    Past Psychiatric History: See intake H&P for full details. Reviewed, with no updates at this time.   Past Medical History:  Past Medical History:  Diagnosis Date  . Anxiety   . Arthritis   . Bipolar 1 disorder ([place])   . Chronic pain syndrome    back  . COPD (chronic obstructive pulmonary disease) ([place])   . Gastroparesis   . GERD (gastroesophageal reflux disease)   . Hiatal hernia   . History of bladder cancer urologist-  dr Consuella Lose   papillay TCC (Ta G1)  s/p TURBT and chemo instillation 2014  . History of encephalopathy 05/27/2015   admission w/ acute encephalopathy thought to be secondary to pain meds and COPD  . History of gastric ulcer   . History of kidney stones   . History of TIA (transient ischemic attack) 2008    no residual  . History of traumatic head injury 2010   w/ LOC  per pt needed stitches  . Hyperlipidemia   . Hypothyroidism   . Insomnia    per sleep study 04-19-2015 without sleep apnea  . Neuropathy in diabetes ([place])    LOWER EXTREMITIES  . Poor historian   . Seizures, transient [facility]) neurologist-  dr Krista Blue--  differential dx complex partial seizure .vs.  mood disorder .vs.  pseudoseizure--  negative EEG's   confusion episodes and staring spells since 11/ 2015  . Transient confusion NEUOROLOGIST-  DR  YAN   Episodes since 11/ 2015--  neurologist dx  differential complex partial seizure  .vs. mood disorder . vs. pseudoseizure  . Type 2 diabetes mellitus treated with insulin ([place])   . Urinary hesitancy     Past Surgical History:  Procedure Laterality Date  . CARDIAC CATHETERIZATION  12-27-2001  DR Einar Gip  &  05-26-2009  DR Irish Lack   RESULTS FOR BOTH ARE NORMAL CORONARIES AND PERSERVED LVF/ EF 60%  . CARPAL TUNNEL RELEASE Right 09-16-2003  . CARPAL TUNNEL RELEASE Left 02/25/2015   Procedure: LEFT CARPAL TUNNEL RELEASE;  Surgeon: Leanora Cover, MD;  Location: [facility];  Service: Orthopedics;  Laterality: Left;  . CYSTOSCOPY N/A 10/10/2012   Procedure: CYSTOSCOPY CLOT EVACUATION FULGERATION OF BLEEDERS ;  Surgeon: Claybon Jabs, MD;  Location[facility];  Service: Urology;  Laterality: N/A;  . CYSTOSCOPY WITH BIOPSY N/A 11/26/2015   Procedure: CYSTOSCOPY WITH BIOPSY AND FULGURATION;  Surgeon: Kathie Rhodes, MD;  Location: [facility];  Service: Urology;  Laterality: N/A;  . LAPAROSCOPIC CHOLECYSTECTOMY  11-17-2010  . NEGATIVE SLEEP STUDY  04-19-2015  in epic  . ORCHIECTOMY Right 02/21/2016   Procedure: SCROTAL ORCHIECTOMY with TESTICULAR PROSTHESIS IMPLANT;  Surgeon: Kathie Rhodes, MD;  Location: [facility];  Service: Urology;  Laterality: Right;  . ROTATOR CUFF REPAIR Right 12/2004  . TRANSURETHRAL RESECTION OF BLADDER TUMOR N/A 08/09/2012   Procedure: TRANSURETHRAL RESECTION OF BLADDER TUMOR (TURBT) WITH GYRUS WITH MITOMYCIN C;  Surgeon: Claybon Jabs, MD;  Location: [facility];  Service: Urology;  Laterality: N/A;  . TRANSURETHRAL RESECTION OF BLADDER TUMOR WITH GYRUS (TURBT-GYRUS) N/A 02/27/2014   Procedure: TRANSURETHRAL RESECTION OF BLADDER TUMOR WITH GYRUS (TURBT-GYRUS);  Surgeon: Claybon Jabs, MD;  Location: [facility];  Service: Urology;  Laterality: N/A;    Family Psychiatric History: See intake H&P for full details. Reviewed, with no updates at this time.   Family History:  Family History  Problem Relation Age of Onset  . Diabetes Mother   . Heart attack Mother   . Diabetes Father   . Hypertension Father   . Heart attack Father   . Alcohol abuse Father     Social History:  Social History   Social History  . Marital status: Married    Spouse name: N/A  . Number of children: 3  . Years of education: GED   Occupational History  . Warden/ranger of company   Social History Main Topics   . Smoking status: Current Every Day Smoker    Packs/day: 1.50    Years: 25.00    Types: Cigarettes  . Smokeless tobacco: Never Used     Comment: Has cut back to 1 pack a day  . Alcohol use No  . Drug use: No  . Sexual activity: Yes    Partners: Female    Birth control/ protection: None   Other Topics Concern  . None   Social History Narrative   Lives at home with his wife and children.   Left-handed.   3-4 cups caffeine per day.    Allergies:  Allergies  Allergen Reactions  . Celebrex [Celecoxib] Anaphylaxis and Rash  . Hydrocodone Rash and Other (See Comments)    "blisters developed on arms"  . Sulfa Antibiotics Rash    Metabolic Disorder Labs: Lab Results  Component Value Date   HGBA1C 9.1 (H) 05/06/2016   MPG 214  05/06/2016   MPG 166 (H) 03/06/2014   No results found for: PROLACTIN Lab Results  Component Value Date   CHOL 277 (H) 05/06/2016   TRIG 431 (H) 05/06/2016   HDL 34 (L) 05/06/2016   CHOLHDL 8.1 05/06/2016   VLDL UNABLE TO CALCULATE IF TRIGLYCERIDE OVER 400 mg/dL 05/06/2016   LDLCALC UNABLE TO CALCULATE IF TRIGLYCERIDE OVER 400 mg/dL 05/06/2016   LDLCALC  05/27/2009    UNABLE TO CALCULATE IF TRIGLYCERIDE OVER 400 mg/dL        Total Cholesterol/HDL:CHD Risk Coronary Heart Disease Risk Table                     Men   Women  1/2 Average Risk   3.4   3.3  Average Risk       5.0   4.4  2 X Average Risk   9.6   7.1  3 X Average Risk  23.4   11.0        Use the calculated Patient Ratio above and the CHD Risk Table to determine the patient's CHD Risk.        ATP III CLASSIFICATION (LDL):  <100     mg/dL   Optimal  100-129  mg/dL   Near or Above                    Optimal  130-159  mg/dL   Borderline  160-189  mg/dL   High  >190     mg/dL   Very High   Lab Results  Component Value Date   TSH 3.735 05/06/2016   TSH 3.225 03/05/2014    Therapeutic Level Labs: Lab Results  Component Value Date   LITHIUM 0.35 (L) 05/28/2015   Lab Results   Component Value Date   VALPROATE 17 (L) 05/06/2016   No components found for:  CBMZ  Current Medications: Current Outpatient Prescriptions  Medication Sig Dispense Refill  . ALPRAZolam (XANAX) 0.5 MG tablet Take 1 tablet (0.5 mg total) by mouth 3 (three) times daily as needed for anxiety (anger, panic). 90 tablet 1  . atorvastatin (LIPITOR) 40 MG tablet Take 1 tablet (40 mg total) by mouth daily at 6 PM. 30 tablet 1  . clindamycin (CLEOCIN) 150 MG capsule     . fentaNYL (DURAGESIC - DOSED MCG/HR) 75 MCG/HR Place 100 mcg onto the skin every 3 (three) days.     . folic acid (FOLVITE) 1 MG tablet Take 1 mg by mouth daily.     Marland Kitchen gabapentin (NEURONTIN) 600 MG tablet Take 2 tablets (1,200 mg total) by mouth 2 (two) times daily. 120 tablet 3  . hydroxychloroquine (PLAQUENIL) 200 MG tablet Take by mouth daily.    . insulin lispro protamine-lispro (HUMALOG 75/25 MIX) (75-25) 100 UNIT/ML SUSP injection Inject into the skin.    Marland Kitchen lamoTRIgine (LAMICTAL) 100 MG tablet Take 2 tablets (200 mg total) by mouth 2 (two) times daily. 360 tablet 4  . levothyroxine (SYNTHROID, LEVOTHROID) 25 MCG tablet Take 25 mcg by mouth daily before breakfast.     . Methotrexate, PF, (RASUVO) 25 MG/0.5ML SOAJ Inject 25 mg into the skin every 7 (seven) days.    . mirtazapine ([name]) 30 MG tablet Take 1 tablet (30 mg total) by mouth at bedtime. 90 tablet 1  . Naloxegol Oxalate (MOVANTIK PO) Take 25 mg by mouth daily.    Marland Kitchen omeprazole (PRILOSEC) 20 MG capsule Take 20 mg by mouth every morning.     Marland Kitchen  oxyCODONE-acetaminophen (PERCOCET/ROXICET) 5-325 MG tablet     . pregabalin (LYRICA) 100 MG capsule Take 100-200 mg by mouth 2 (two) times daily. Pt takes one capsule in the am and two at night.    . tadalafil (CIALIS) 20 MG tablet Take 20 mg by mouth daily as needed for erectile dysfunction.    Marland Kitchen venlafaxine XR (EFFEXOR-XR) 150 MG 24 hr capsule Take 2 capsules (300 mg total) by mouth daily with breakfast. 90 capsule 1  .  ARIPiprazole (ABILIFY) 2 MG tablet Take 1 tablet (2 mg total) by mouth daily. 90 tablet 1  . traZODone (DESYREL) 100 MG tablet Take 1 tablet (100 mg total) by mouth at bedtime. 90 tablet 1   No current facility-administered medications for this visit.      Musculoskeletal: Strength & Muscle Tone: within normal limits Gait & Station: normal Patient leans: N/A  Psychiatric Specialty Exam: ROS  Blood pressure 126/74, pulse 85, height 6' (1.829 m), weight 216 lb 3.2 oz (98.1 kg).Body mass index is 29.32 kg/m.  General Appearance: Casual and Fairly Groomed  Eye Contact:  Good  Speech:  Clear and Coherent  Volume:  Normal  Mood:  Anxious and Dysphoric  Affect:  Congruent  Thought Process:  Goal Directed  Orientation:  Full (Time, Place, and Person)  Thought Content: Logical   Suicidal Thoughts:  No  Homicidal Thoughts:  No  Memory:  Immediate;   Fair  Judgement:  Fair  Insight:  Fair  Psychomotor Activity:  Normal  Concentration:  Concentration: Fair  Recall:  [employer] of Knowledge: Fair  Language: Fair  Akathisia:  Negative  Handed:  Right  AIMS (if indicated): not done  Assets:  Communication Skills Desire for Improvement Financial Resources/Insurance Housing Intimacy Leisure Time Social Support Transportation  ADL's:  Intact  Cognition: WNL  Sleep:  Poor   Screenings: AUDIT     Admission (Discharged) from 05/05/2016 in [facility]  Alcohol Use Disorder Identification Test Final Score (AUDIT)  1    Mini-Mental     Office Visit from 11/04/2014 in [facility]  Total Score (max 30 points )  26    PHQ2-9     Office Visit from 03/24/2015 in [facility]  PHQ-2 Total Score  2  PHQ-9 Total Score  12       Assessment and Plan: Vincent [name] is a 51 year old male with multiple medical problems, chronic PTSD, and major depressive disorder. He has had a recent flare and depressive symptoms, associated  with some mild psychotic-like phenomena. He does not present as grossly disorganized, manic, or acutely psychotic. I believe he would benefit from low-dose Abilify to help boost his antidepressant regimen. We will proceed as below and follow up in 6-8 weeks. He does not present any acute safety issues at this time, and remains abstinent from alcohol and drugs.  1. Chronic post-traumatic stress disorder (PTSD)   2. Moderate episode of recurrent major depressive disorder ([place])   3. Psychophysiological insomnia    Increased Effexor to 300 mg X are daily Continue [name] 30 mg nightly Initiate Abilify 2 mg daily for augmentation of regimen Initiate trazodone 100 mg nightly for sleep Decrease Xanax to 0.5 mg 3 times daily as needed for anxiety or agitation Continue gabapentin and Lamictal as prescribed Follow-up with Leanne for individual therapy Follow-up with this writer in 6-8 weeks Neuropsych testing pending, concern for possible mild neurocognitive disorder Check CBC, B12, folate, vitamin D, iron  Sheppard Coil  Vallarie Mare, MD 11/22/2016, 11:19 AM

## 2016-11-23 ENCOUNTER — Other Ambulatory Visit (HOSPITAL_COMMUNITY): Payer: Self-pay | Admitting: Psychiatry

## 2016-11-23 ENCOUNTER — Telehealth (HOSPITAL_COMMUNITY): Payer: Self-pay | Admitting: Psychology

## 2016-11-23 DIAGNOSIS — E559 Vitamin D deficiency, unspecified: Secondary | ICD-10-CM

## 2016-11-23 LAB — CBC WITH DIFFERENTIAL/PLATELET
Basophils Absolute: 0 10*3/uL (ref 0.0–0.2)
Basos: 1 %
EOS (ABSOLUTE): 0.1 10*3/uL (ref 0.0–0.4)
Eos: 2 %
Hematocrit: 49.7 % (ref 37.5–51.0)
Hemoglobin: 16.5 g/dL (ref 13.0–17.7)
Immature Grans (Abs): 0 10*3/uL (ref 0.0–0.1)
Immature Granulocytes: 1 %
Lymphocytes Absolute: 2 10*3/uL (ref 0.7–3.1)
Lymphs: 35 %
MCH: 29.9 pg (ref 26.6–33.0)
MCHC: 33.2 g/dL (ref 31.5–35.7)
MCV: 90 fL (ref 79–97)
Monocytes Absolute: 0.4 10*3/uL (ref 0.1–0.9)
Monocytes: 7 %
Neutrophils Absolute: 3.2 10*3/uL (ref 1.4–7.0)
Neutrophils: 54 %
Platelets: 249 10*3/uL (ref 150–379)
RBC: 5.51 x10E6/uL (ref 4.14–5.80)
RDW: 13.6 % (ref 12.3–15.4)
WBC: 5.8 10*3/uL (ref 3.4–10.8)

## 2016-11-23 LAB — VITAMIN B12: Vitamin B-12: 1052 pg/mL (ref 232–1245)

## 2016-11-23 LAB — IRON: Iron: 64 ug/dL (ref 38–169)

## 2016-11-23 LAB — FOLATE: Folate: >20 ng/mL (ref >3.0)

## 2016-11-23 LAB — VITAMIN D 25 HYDROXY (VIT D DEFICIENCY, FRACTURES): Vit D, 25-Hydroxy: 22.5 ng/mL — ABNORMAL LOW (ref 30.0–100.0)

## 2016-11-23 MED ORDER — VITAMIN D 50 MCG (2000 UT) PO TABS: 2000.0000 [IU] | ORAL_TABLET | Freq: Every day | ORAL | 1 refills | Status: DC

## 2016-11-23 NOTE — Progress Notes (Signed)
Discussed lab results with patient's wife, and instructed to pick up vitamin D supplement.

## 2016-11-28 ENCOUNTER — Ambulatory Visit (HOSPITAL_COMMUNITY): Payer: Self-pay | Admitting: Psychology

## 2016-11-30 DIAGNOSIS — R3911 Hesitancy of micturition: Secondary | ICD-10-CM | POA: Diagnosis not present

## 2016-11-30 DIAGNOSIS — Z8551 Personal history of malignant neoplasm of bladder: Secondary | ICD-10-CM | POA: Diagnosis not present

## 2016-12-04 ENCOUNTER — Ambulatory Visit (HOSPITAL_BASED_OUTPATIENT_CLINIC_OR_DEPARTMENT_OTHER): Payer: 59 | Attending: Psychiatry | Admitting: Internal Medicine

## 2016-12-04 VITALS — Ht 72.0 in | Wt 215.0 lb

## 2016-12-04 DIAGNOSIS — G4730 Sleep apnea, unspecified: Secondary | ICD-10-CM | POA: Insufficient documentation

## 2016-12-04 DIAGNOSIS — G4733 Obstructive sleep apnea (adult) (pediatric): Secondary | ICD-10-CM

## 2016-12-04 DIAGNOSIS — G4750 Parasomnia, unspecified: Secondary | ICD-10-CM | POA: Diagnosis present

## 2016-12-05 ENCOUNTER — Telehealth (HOSPITAL_COMMUNITY): Payer: Self-pay | Admitting: Psychology

## 2016-12-05 ENCOUNTER — Ambulatory Visit (INDEPENDENT_AMBULATORY_CARE_PROVIDER_SITE_OTHER): Payer: 59 | Admitting: Psychology

## 2016-12-05 ENCOUNTER — Ambulatory Visit (HOSPITAL_COMMUNITY): Payer: 59 | Admitting: Licensed Clinical Social Worker

## 2016-12-05 DIAGNOSIS — F4312 Post-traumatic stress disorder, chronic: Secondary | ICD-10-CM | POA: Diagnosis not present

## 2016-12-05 NOTE — Telephone Encounter (Signed)
Patient was scheduled with Janett Billow due to [facility] not being credentialed with Medicare.  Patient does not want to see another Therapist.  Is willing to pay so he can stay with Leanne.  States his wife works for [employer] and looked up the cost and he is willing to pay. dlh
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
## 2016-12-11 ENCOUNTER — Ambulatory Visit (INDEPENDENT_AMBULATORY_CARE_PROVIDER_SITE_OTHER): Payer: 59 | Admitting: Psychology

## 2016-12-11 ENCOUNTER — Other Ambulatory Visit (HOSPITAL_COMMUNITY): Payer: Self-pay | Admitting: Psychiatry

## 2016-12-11 ENCOUNTER — Telehealth (HOSPITAL_COMMUNITY): Payer: Self-pay

## 2016-12-11 DIAGNOSIS — F4310 Post-traumatic stress disorder, unspecified: Secondary | ICD-10-CM | POA: Diagnosis not present

## 2016-12-11 DIAGNOSIS — F329 Major depressive disorder, single episode, unspecified: Secondary | ICD-10-CM

## 2016-12-11 DIAGNOSIS — F4312 Post-traumatic stress disorder, chronic: Secondary | ICD-10-CM

## 2016-12-11 DIAGNOSIS — F331 Major depressive disorder, recurrent, moderate: Secondary | ICD-10-CM

## 2016-12-11 MED ORDER — MIRTAZAPINE 45 MG PO TABS: 45.0000 mg | ORAL_TABLET | Freq: Every day | ORAL | 1 refills | Status: DC

## 2016-12-11 NOTE — Telephone Encounter (Signed)
Medication management - Met with pt. in seeing therapist today to discuss his status with restless leg symptoms when taking Trazodone so he stopped and reports this went away. States he then increased his [name] to 30 mg, 2 at bedtime.  Patient stated he is now sleeping fine with this dosage and with Xanax 0.5 mg three times a day but informed this was a very high dosage of [name].  Patient agreed to wait while this nurse discussed his changes that he made with his medication with Dr. Daron Offer.  Met with Dr. Daron Offer and then to inform patient of concern potential liver issues with such a high dosage of [name] but Dr. Daron Offer would agreed to [name] 45 mg total at bedtime.  Patient agreed to not take any more than this nightly and to not return to taking Trazodone as will be discontinued.  Patient encouraged to keep follow up appointment with Dr. Daron Offer on 01/05/17 and to call if any problems prior to appointment.  Informed Dr. Daron Offer is sending in new [name] order today for 45 mg, at bedtime.
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
## 2016-12-16 DIAGNOSIS — G4750 Parasomnia, unspecified: Secondary | ICD-10-CM | POA: Diagnosis not present

## 2016-12-16 NOTE — Procedures (Signed)
  Patient Name: Vincent [name], Vincent [name] Date: 12/04/2016 Gender: Male D.O.B: May 07, 1965 Age (years): 51 Referring Provider: Lulu Riding Height (inches): 72 Interpreting Physician: Baird Lyons MD, ABSM Weight (lbs): 215 RPSGT: Madelon Lips BMI: 29 MRN: [id number] Neck Size: 17.00 CLINICAL INFORMATION Sleep Study Type: NPSG  Indication for sleep study: Parasomnias  Epworth Sleepiness Score: 14  SLEEP STUDY TECHNIQUE As per the AASM Manual for the Scoring of Sleep and Associated Events v2.3 (April 2016) with a hypopnea requiring 4% desaturations.  The channels recorded and monitored were frontal, central and occipital EEG, electrooculogram (EOG), submentalis EMG (chin), nasal and oral airflow, thoracic and abdominal wall motion, anterior tibialis EMG, snore microphone, electrocardiogram, and pulse oximetry.  MEDICATIONS Medications self-administered by patient taken the night of the study : none reported  SLEEP ARCHITECTURE The study was initiated at 10:40:05 PM and ended at 4:48:42 AM.  Sleep onset time was 1.3 minutes and the sleep efficiency was 94.1%. The total sleep time was 346.8 minutes.  Stage REM latency was 86.0 minutes.  The patient spent 5.05% of the night in stage N1 sleep, 60.49% in stage N2 sleep, 0.00% in stage N3 and 34.46% in REM.  Alpha intrusion was absent.  Supine sleep was 68.92%.  RESPIRATORY PARAMETERS The overall apnea/hypopnea index (AHI) was 6.9 per hour. There were 24 total apneas, including 13 obstructive, 11 central and 0 mixed apneas. There were 16 hypopneas and 0 RERAs.  The AHI during Stage REM sleep was 7.5 per hour.  AHI while supine was 9.3 per hour.  The mean oxygen saturation was 94.68%. The minimum SpO2 during sleep was 88.00%.  loud snoring was noted during this study.  CARDIAC DATA The 2 lead EKG demonstrated sinus rhythm. The mean heart rate was 67.76 beats per minute. Other EKG findings include: None.  LEG MOVEMENT  DATA The total PLMS were 0 with a resulting PLMS index of 0.00. Associated arousal with leg movement index was 0.0 .  IMPRESSIONS - Mild obstructive sleep apnea occurred during this study (AHI = 6.9/h). - No significant central sleep apnea occurred during this study (CAI = 1.9/h). - The patient had minimal or no oxygen desaturation during the study (Min O2 = 88.00%) - The patient snored with loud snoring volume. - No cardiac abnormalities were noted during this study. - Clinically significant periodic limb movements did not occur during sleep. No significant associated arousals. - No unusual difficulty noted initiating or maintaining sleep.  DIAGNOSIS - Obstructive Sleep Apnea (327.23 [G47.33 ICD-10])  RECOMMENDATIONS - Positional therapy avoiding supine position during sleep. - Very mild obstructive sleep apnea. Treatment would be directed at symptoms. Beyond conservative measures,  CPAP titration, a fitted oral appliance, or ENT evaluation might be considered based on clinical judgment. - Be careful with alcohol, sedatives and other CNS depressants that may worsen sleep apnea and disrupt normal sleep architecture. - Sleep hygiene should be reviewed to assess factors that may improve sleep quality. - Weight management and regular exercise should be initiated or continued if appropriate.  [Electronically signed] 12/16/2016 10:52 AM  Baird Lyons MD, ABSM Diplomate, American Board of Sleep Medicine   NPI: [id number]  [place], American Board of Sleep Medicine  ELECTRONICALLY SIGNED ON:  12/16/2016, 10:46 AM [place] PH: [phone]   FX: [phone] [place]

## 2016-12-18 DIAGNOSIS — G894 Chronic pain syndrome: Secondary | ICD-10-CM | POA: Diagnosis not present

## 2016-12-18 DIAGNOSIS — F4310 Post-traumatic stress disorder, unspecified: Secondary | ICD-10-CM | POA: Diagnosis not present

## 2016-12-18 DIAGNOSIS — M47816 Spondylosis without myelopathy or radiculopathy, lumbar region: Secondary | ICD-10-CM | POA: Diagnosis not present

## 2016-12-18 DIAGNOSIS — M5136 Other intervertebral disc degeneration, lumbar region: Secondary | ICD-10-CM | POA: Diagnosis not present

## 2016-12-19 ENCOUNTER — Ambulatory Visit (INDEPENDENT_AMBULATORY_CARE_PROVIDER_SITE_OTHER): Payer: 59 | Admitting: Nurse Practitioner

## 2016-12-19 ENCOUNTER — Encounter: Payer: Self-pay | Admitting: Nurse Practitioner

## 2016-12-19 DIAGNOSIS — G40909 Epilepsy, unspecified, not intractable, without status epilepticus: Secondary | ICD-10-CM | POA: Insufficient documentation

## 2016-12-19 DIAGNOSIS — G250 Essential tremor: Secondary | ICD-10-CM

## 2016-12-19 DIAGNOSIS — Z79899 Other long term (current) drug therapy: Secondary | ICD-10-CM | POA: Insufficient documentation

## 2016-12-19 DIAGNOSIS — G40919 Epilepsy, unspecified, intractable, without status epilepticus: Secondary | ICD-10-CM | POA: Insufficient documentation

## 2016-12-19 NOTE — Progress Notes (Signed)
Fax confirmation for neurovative diagnostics for video EEG 72 hours and if needed routine EEG.  [phone].  sy

## 2016-12-19 NOTE — Patient Instructions (Signed)
Continue Lamictal at current dose ready to pick up Will order home video EEG monitoring please call us back if you do not hear from someone in a few days F/U in 6 months

## 2016-12-19 NOTE — Progress Notes (Signed)
[facility]  PATIENT: Vincent [name] DOB: 11-Dec-1965   REASON FOR VISIT: follow up for seizure disorder, essential tremor HISTORY FROM:patient    HISTORY OF PRESENT ILLNESS:HISTORY Vincent [name] is a 51 years old left-handed male, accompanied by his wife [name], seen in refer by his primary care physician [name] for evaluation of transient confusion episodes Chart reviewed, he had past medical history of hypertension, hyperlipidemia, diabetes, insulin-dependent, history of bladder cancer, bipolar disorder, is taking Lithium, chronic pain, on fentanyl patch, longtime smoker, 2 packs per day Since November 2015, he had multiple recurrent episode of confusion, sometimes he forgets that he has to take out his trash can, repeating his questions, was also noted by family to have staring spells, confusion can last up to 8 hours, there was no loss of consciousness Some of the episode has acute onset, in March 05 2014, he went outside of the yard after smoking a cigarette, he suddenly became confused, repeating his questions, agitated, combative, "as if he is on drugs " , the confusion last about 24 hours, he was taken by ambulance to the emergency room, UDS was negative, Lab reviewed, appetite is panel was negative, HIV was negative, ammonia was mildly elevated, it was considered due to polypharmacy fentanyl patch, Dilaudid, Ultram, Neurontin He reported history of head trauma in 2008, his right frontal region was hit by a rotatory plate, transient loss of consciousness, with skin abrasion, had multiple stitches. have personally reviewed MRI of the brain with and without contrast December 2015 that was normal, EEG was normal   UPDATE Oct 5th 2016:[name] was normal, he tolerated lamotrigine very well, while he was taking 100 mg twice a day, he has much less current episode, only had 3 episode since September 2016, short lasting, mild confusion,  I also watched the  video tape March 05 2014, patient was agitated, confused, but no dysarthria, no lateralized motor deficit noticed during the video tape, he was searching through the knife Holder for his keys, could not find towels at his home  His wife reported recurrent events, August 24, he has prolonged tremor of his left arm, complains everything look orange, very sleepy afterwards November 06 2014, black stairs, lasting for 10 minutes, disoriented for the rest of the night, fell to sleep during dinnertime August 27, he complains of feeling strange, took a long nap, irritable afterwords August 30 first, disoriented confused, not sure where he was, fell to sleep, broke out in cold sweat afterwards September 2, he slept outside in the chair, woke up slumped to the floor, face looked droopy, September 6, he complains of confused, feel funny, September 10, complains of confused not feeling well,  He had no recurrent episode ever since. His mood has improved with lamotrigine as well  UPDATE Mar 18 2015:YY He had few episodes since last visit, confused, mild slurred speech, slow talking, everything goes slow motion while taking The more he use his hands, the more shaky he got. He is now taking lamotrigine 100 mg in the morning, 200 mg at nighttime He also complains of difficulty sleeping, snoring, excessive daytime sleepiness, fatigue, ESS score is 15, FSS score is 57  UPDATE 01/19/2017CM [name], 51 year old male returns for follow-up. He has been previously seen in this office by [name]. Apparently he had seizure-like activity on Saturday and then 2 seizures last night one lasting greater than 10 minutes. He did not go to the emergency room. Apparently last night he  walked outside to smoke cigarettes then his wife heard him pounding on the door, patient was able to talk to her however his head was shaking and his  entire body was shaking. I watched a video and saw  no lateralized motor deficit. His eyes were  closed. After the event he feels off balance with facial drooping. He is currently on Lamictal 200 twice daily. He was recently evaluated for OSA and is due to have a sleep study. He also needs repeat EEG. He returns for reevaluation. Previous records reviewed  UPDATE June 28 2016:YY He had sleep study in February 2018, which revealed insomnia, had frequent snore, infrequent periodic leg movement disorder, but no evidence of significant central or obstructive sleep apnea,, MRI of the brain in March 2017 was normal, EEG in February 2017 was normal, He continues to have seizure like activities, intermittent, once a week, he was was seen by [name], was diagnosed with PTSD.  He was given xanax,effecor, remeron, he was put on Depakote ER 1500mg  qhs since 2017, in the process of being off the Depakote by his psychologist, He is now sleeping better, taking xanax 1mg  tid prn, [name] 150mg  qhs,  He complains of left hand tremor since 2017, getting worse, intermittent exacerbation to the point of difficulty feeding himself, tying his shoes, and is his dominant hand,  I reviewed laboratory evaluation February 2018, normal TSH, he is on thyroid supplement, Depakote level 17, glucose 82, A1c was 9.1, UDS was negative, cholesterol is 277, triglyceride is 431, HDL was 34 He was noted to have elaborate bilateral hands tremor, somewhat under volunteer control.  UPDATE 10/09/2018CM [name], 51 year old male returns for follow-up with history of seizure-like activity. He claims he has had 2 seizures in the last 4.5  months. He was having them on a weekly basis when last seen by [name].He is on Depakote now by psychiatrist continues with hand tremor left greater than right. Patient also states she drinks about 30 cups of coffee daily. He was made aware that caffeine cam make his  tremor worse. It is a stimulant. He had prolonged EEG monitoring ordered at his last visit  however this has not been done. Patient claims he never got a call he continues to follow with psychiatry. He returns for reevaluation  REVIEW OF SYSTEMS: Full 14 system review of systems performed and notable only for those listed, all others are neg:  Constitutional: neg  Cardiovascular: neg Ear/Nose/Throat: neg  Skin: neg Eyes: neg Respiratory: neg Gastroitestinal: neg  Hematology/Lymphatic: neg  Endocrine: neg Musculoskeletal:back pain joint pain Allergy/Immunology: neg Neurological: seizure events, tremor Psychiatric: depression and anxiety Sleep : neg   ALLERGIES: Allergies  Allergen Reactions  . Celebrex [Celecoxib] Anaphylaxis and Rash  . Hydrocodone Rash and Other (See Comments)    "blisters developed on arms"  . Sulfa Antibiotics Rash    HOME MEDICATIONS: Outpatient Medications Prior to Visit  Medication Sig Dispense Refill  . ALPRAZolam (XANAX) 0.5 MG tablet Take 1 tablet (0.5 mg total) by mouth 3 (three) times daily as needed for anxiety (anger, panic). 90 tablet 1  . ARIPiprazole (ABILIFY) 2 MG tablet Take 1 tablet (2 mg total) by mouth daily. 90 tablet 1  . atorvastatin (LIPITOR) 40 MG tablet Take 1 tablet (40 mg total) by mouth daily at 6 PM. 30 tablet 1  . Cholecalciferol (VITAMIN D) 2000 units tablet Take 1 tablet (2,000 Units total) by mouth daily. 90 tablet 1  .  clindamycin (CLEOCIN) 150 MG capsule     . fentaNYL (DURAGESIC - DOSED MCG/HR) 75 MCG/HR Place 100 mcg onto the skin every 3 (three) days.     . folic acid (FOLVITE) 1 MG tablet Take 1 mg by mouth daily.     Marland Kitchen gabapentin (NEURONTIN) 600 MG tablet Take 2 tablets (1,200 mg total) by mouth 2 (two) times daily. 120 tablet 3  . hydroxychloroquine (PLAQUENIL) 200 MG tablet Take by mouth daily.    . insulin lispro protamine-lispro (HUMALOG 75/25 MIX) (75-25) 100 UNIT/ML SUSP injection Inject into the skin.    Marland Kitchen lamoTRIgine (LAMICTAL) 100 MG tablet Take 2 tablets (200 mg total) by mouth 2 (two) times daily.  360 tablet 4  . levothyroxine (SYNTHROID, LEVOTHROID) 25 MCG tablet Take 25 mcg by mouth daily before breakfast.     . Methotrexate, PF, (RASUVO) 25 MG/0.5ML SOAJ Inject 25 mg into the skin every 7 (seven) days.    . mirtazapine ([name]) 45 MG tablet Take 1 tablet (45 mg total) by mouth at bedtime. 90 tablet 1  . Naloxegol Oxalate (MOVANTIK PO) Take 25 mg by mouth daily.    Marland Kitchen omeprazole (PRILOSEC) 20 MG capsule Take 20 mg by mouth every morning.     Marland Kitchen oxyCODONE-acetaminophen (PERCOCET/ROXICET) 5-325 MG tablet     . pregabalin (LYRICA) 100 MG capsule Take 100-200 mg by mouth 2 (two) times daily. Pt takes one capsule in the am and two at night.    . venlafaxine XR (EFFEXOR-XR) 150 MG 24 hr capsule Take 2 capsules (300 mg total) by mouth daily with breakfast. 90 capsule 1  . tadalafil (CIALIS) 20 MG tablet Take 20 mg by mouth daily as needed for erectile dysfunction.     No facility-administered medications prior to visit.     PAST MEDICAL HISTORY: Past Medical History:  Diagnosis Date  . Anxiety   . Arthritis   . Bipolar 1 disorder ([place])   . Chronic pain syndrome    back  . COPD (chronic obstructive pulmonary disease) ([place])   . Gastroparesis   . GERD (gastroesophageal reflux disease)   . Hiatal hernia   . History of bladder cancer urologist-  dr Consuella Lose   papillay TCC (Ta G1)  s/p TURBT and chemo instillation 2014  . History of encephalopathy 05/27/2015   admission w/ acute encephalopathy thought to be secondary to pain meds and COPD  . History of gastric ulcer   . History of kidney stones   . History of TIA (transient ischemic attack) 2008    no residual  . History of traumatic head injury 2010   w/ LOC  per pt needed stitches  . Hyperlipidemia   . Hypothyroidism   . Insomnia    per sleep study 04-19-2015 without sleep apnea  . Neuropathy in diabetes ([place])    LOWER EXTREMITIES  . Poor historian   . Seizures, transient [facility]) neurologist-  dr Krista Blue--  differential dx complex  partial seizure .vs.  mood disorder .vs.  pseudoseizure--  negative EEG's   confusion episodes and staring spells since 11/ 2015  . Transient confusion NEUOROLOGIST-  DR YAN   Episodes since 11/ 2015--  neurologist dx  differential complex partial seizure  .vs. mood disorder . vs. pseudoseizure  . Type 2 diabetes mellitus treated with insulin ([place])   . Urinary hesitancy     PAST SURGICAL HISTORY: Past Surgical History:  Procedure Laterality Date  . CARDIAC CATHETERIZATION  12-27-2001  DR Einar Gip  &  05-26-2009  DR Irish Lack   RESULTS FOR BOTH ARE NORMAL CORONARIES AND PERSERVED LVF/ EF 60%  . CARPAL TUNNEL RELEASE Right 09-16-2003  . CARPAL TUNNEL RELEASE Left 02/25/2015   Procedure: LEFT CARPAL TUNNEL RELEASE;  Surgeon: Leanora Cover, MD;  Location: [facility];  Service: Orthopedics;  Laterality: Left;  . CYSTOSCOPY N/A 10/10/2012   Procedure: CYSTOSCOPY CLOT EVACUATION FULGERATION OF BLEEDERS ;  Surgeon: Claybon Jabs, MD;  Location: [facility];  Service: Urology;  Laterality: N/A;  . CYSTOSCOPY WITH BIOPSY N/A 11/26/2015   Procedure: CYSTOSCOPY WITH BIOPSY AND FULGURATION;  Surgeon: Kathie Rhodes, MD;  Location: [place];  Service: Urology;  Laterality: N/A;  . LAPAROSCOPIC CHOLECYSTECTOMY  11-17-2010  . NEGATIVE SLEEP STUDY  04-19-2015  in epic  . ORCHIECTOMY Right 02/21/2016   Procedure: SCROTAL ORCHIECTOMY with TESTICULAR PROSTHESIS IMPLANT;  Surgeon: Kathie Rhodes, MD;  Location: [facility];  Service: Urology;  Laterality: Right;  . ROTATOR CUFF REPAIR Right 12/2004  . TRANSURETHRAL RESECTION OF BLADDER TUMOR N/A 08/09/2012   Procedure: TRANSURETHRAL RESECTION OF BLADDER TUMOR (TURBT) WITH GYRUS WITH MITOMYCIN C;  Surgeon: Claybon Jabs, MD;  Location: [facility];  Service: Urology;  Laterality: N/A;  . TRANSURETHRAL RESECTION OF BLADDER TUMOR WITH GYRUS (TURBT-GYRUS) N/A 02/27/2014   Procedure: TRANSURETHRAL  RESECTION OF BLADDER TUMOR WITH GYRUS (TURBT-GYRUS);  Surgeon: Claybon Jabs, MD;  Location: [facility];  Service: Urology;  Laterality: N/A;    FAMILY HISTORY: Family History  Problem Relation Age of Onset  . Diabetes Mother   . Heart attack Mother   . Diabetes Father   . Hypertension Father   . Heart attack Father   . Alcohol abuse Father     SOCIAL HISTORY: Social History   Social History  . Marital status: Married    Spouse name: N/A  . Number of children: 3  . Years of education: GED   Occupational History  . Warden/ranger of company   Social History Main Topics  . Smoking status: Current Every Day Smoker    Packs/day: 1.50    Years: 25.00    Types: Cigarettes  . Smokeless tobacco: Never Used     Comment: Has cut back to 1 pack a day  . Alcohol use No  . Drug use: No  . Sexual activity: Yes    Partners: Female    Birth control/ protection: None   Other Topics Concern  . Not on file   Social History Narrative   Lives at home with his wife and children.   Left-handed.   3-4 cups caffeine per day.     PHYSICAL EXAM  Vitals:   12/19/16 1426  BP: 121/71  Pulse: 92  Weight: 227 lb 9.6 oz (103.2 kg)  Height: 6' (1.829 m)   Body mass index is 30.87 kg/m.  Generalized: Well developed, obese male in no acute distress  Head: normocephalic and atraumatic,. Oropharynx benign  Neck: Supple, no carotid bruits  Cardiac: Regular rate rhythm, no murmur  Musculoskeletal: No deformity   Neurological examination   Mentation: Alert oriented to time, place, history taking. Attention span and concentration appropriate. Recent and remote memory intact.  Follows all commands speech and language fluent.   Cranial nerve II-XII: Fundoscopic exam reveals sharp disc margins.Pupils were equal round reactive to light extraocular movements were full, visual field were full on confrontational test. Facial sensation and strength were  normal. hearing was intact to finger rubbing bilaterally. Uvula tongue midline. head turning and shoulder shrug were normal and symmetric.Tongue protrusion into cheek strength was normal. Motor: normal bulk and tone, full strength in the BUE, BLE, fine finger movements normal, no pronator drift. No focal weakness Sensory: normal and symmetric to light touch, pinprick, and  Vibration, in the upper and lower extremities Coordination: finger-nose-finger, heel-to-shin bilaterally, no dysmetria, Mild bilateral outstreched tremor Reflexes: Brachioradialis 2/2, biceps 2/2, triceps 2/2, patellar 2/2, Achilles 2/2, plantar responses were flexor bilaterally. Gait and Station: Rising up from seated position without assistance, normal stance,  moderate stride, good arm swing, smooth turning, able to perform tiptoe, and heel walking without difficulty. Tandem gait is unsteady  DIAGNOSTIC DATA (LABS, IMAGING, TESTING) - I reviewed patient records, labs, notes, testing and imaging myself where available.  Lab Results  Component Value Date   WBC 5.8 11/22/2016   HGB 16.5 11/22/2016   HCT 49.7 11/22/2016   MCV 90 11/22/2016   PLT 249 11/22/2016      Component Value Date/Time   NA 137 05/05/2016 1700   NA 137 04/01/2015 1006   K 4.1 05/05/2016 1700   CL 105 05/05/2016 1700   CO2 24 05/05/2016 1700   GLUCOSE 287 (H) 05/05/2016 1700   BUN 18 05/05/2016 1700   BUN 14 04/01/2015 1006   CREATININE 0.89 05/05/2016 1700   CALCIUM 9.2 05/05/2016 1700   PROT 5.8 (L) 05/28/2015 0439   PROT 6.0 04/01/2015 1006   ALBUMIN 3.7 05/28/2015 0439   ALBUMIN 4.2 04/01/2015 1006   AST 38 05/28/2015 0439   ALT 39 05/28/2015 0439   ALKPHOS 81 05/28/2015 0439   BILITOT 1.2 05/28/2015 0439   BILITOT 0.3 04/01/2015 1006   GFRNONAA >60 05/05/2016 1700   GFRAA >60 05/05/2016 1700   Lab Results  Component Value Date   CHOL 277 (H) 05/06/2016   HDL 34 (L) 05/06/2016   LDLCALC UNABLE TO CALCULATE IF TRIGLYCERIDE OVER  400 mg/dL 05/06/2016   TRIG 431 (H) 05/06/2016   CHOLHDL 8.1 05/06/2016   Lab Results  Component Value Date   HGBA1C 9.1 (H) 05/06/2016   Lab Results  Component Value Date   VITAMINB12 1,052 11/22/2016   Lab Results  Component Value Date   TSH 3.735 05/06/2016      ASSESSMENT AND PLAN  51 y.o. year old male with history of PTSD on polypharmacy treatment. History of seizure-like events. He reports 2 in last 4.5 months. E has history of bilateral hands tremor which could be related to his excessive caffeine use. 30 cups per day  Continue Lamictal at current dose ready to pick up Will order home video EEG monitoring please call us back if you do not hear from someone in a few days Cut down  on caffeine use, this will make tremor worse F/U in 6 months I spent 25 minutes in total face to face time with the patient more than 50% of which was spent counseling and coordination of care, reviewing test results reviewing medications and discussing and reviewing the diagnosis of seizure events, common seizure triggers and what the home video EEG will entail. Also discussed his excessive caffeine use and how that can effect tremor. Dennie Bible, [facility], [facility], APRN  [facility] [address] [place], [place] [address] [phone]

## 2016-12-20 NOTE — Progress Notes (Signed)
Received fax confirmation for neurovative diagnostics order for 72 hour video EEG and if needed routine EEG (this depending on  His insurance).  [phone]. sy

## 2016-12-21 ENCOUNTER — Ambulatory Visit (HOSPITAL_COMMUNITY): Payer: Self-pay | Admitting: Psychology

## 2016-12-21 ENCOUNTER — Telehealth (HOSPITAL_COMMUNITY): Payer: Self-pay | Admitting: Psychology

## 2016-12-21 NOTE — Telephone Encounter (Signed)
Called and left message for pt that office is closing early due to inclement weather and that next appointment on 12/26/16.  If ok to cancel today's appointment.  Next called to other number listed and wife informed she would pass on to pt as he is currently sleeping and that he would understand.

## 2016-12-26 ENCOUNTER — Ambulatory Visit (INDEPENDENT_AMBULATORY_CARE_PROVIDER_SITE_OTHER): Payer: 59 | Admitting: Psychology

## 2016-12-26 DIAGNOSIS — F4312 Post-traumatic stress disorder, chronic: Secondary | ICD-10-CM | POA: Diagnosis not present

## 2016-12-26 DIAGNOSIS — F331 Major depressive disorder, recurrent, moderate: Secondary | ICD-10-CM

## 2016-12-26 NOTE — Progress Notes (Signed)
I have reviewed and agreed above plan. 

## 2016-12-26 NOTE — Progress Notes (Signed)
   THERAPIST PROGRESS NOTE  Session Time: 12.35pm-1.05pm  Participation Level: Active  Behavioral Response: Well GroomedAlertaffect bright  Type of Therapy: Individual Therapy  Treatment Goals addressed: Diagnosis: PTSD, MDD and goal 1.  Interventions: CBT and Supportive  Summary: Vincent [name] is a 51 y.o. male who presents with full and bright.  Pt reported that he has been sleeping much better and having a positive effect on his mood.  Pt reports that not feeling anxious, not irritable and not depressed. Pt reported one time almost panic attack last week- but was able to have space from everyone at house and take time to relax.  Pt reports interactions w/ family positive as well.  Pt is looking forward to ATV trip w/ guys from church.  Pt reported also able to ride w/ sister recently.  Pt focus on self care- taking medications as prescribed..   Suicidal/Homicidal: Nowithout intent/plan  Therapist Response: Assessed pt current functioning per pt report.  Processed w/pt improvements and benefit of sleep.  Explored w/pt interactions and engagement in activities.  Encouraged continued self care strategies.  Plan: Return again in 1 weeks.  Diagnosis PTSD, MDD, mild   Wafaa Deemer, LPC 12/26/2016

## 2017-01-03 ENCOUNTER — Encounter (HOSPITAL_COMMUNITY): Payer: Self-pay | Admitting: Psychology

## 2017-01-03 ENCOUNTER — Ambulatory Visit (HOSPITAL_COMMUNITY): Payer: Self-pay | Admitting: Psychology

## 2017-01-03 NOTE — Progress Notes (Signed)
Vincent [name] is a 51 y.o. male patient who cancelled his appointment today as woke up sick.        [name], LPC

## 2017-01-05 ENCOUNTER — Ambulatory Visit (INDEPENDENT_AMBULATORY_CARE_PROVIDER_SITE_OTHER): Payer: 59 | Admitting: Psychiatry

## 2017-01-05 ENCOUNTER — Encounter (HOSPITAL_COMMUNITY): Payer: Self-pay | Admitting: Psychiatry

## 2017-01-05 VITALS — BP 160/68 | HR 86 | Ht 72.0 in | Wt 220.4 lb

## 2017-01-05 DIAGNOSIS — G3184 Mild cognitive impairment, so stated: Secondary | ICD-10-CM | POA: Diagnosis not present

## 2017-01-05 DIAGNOSIS — F17210 Nicotine dependence, cigarettes, uncomplicated: Secondary | ICD-10-CM

## 2017-01-05 DIAGNOSIS — F4312 Post-traumatic stress disorder, chronic: Secondary | ICD-10-CM | POA: Diagnosis not present

## 2017-01-05 DIAGNOSIS — F5104 Psychophysiologic insomnia: Secondary | ICD-10-CM | POA: Diagnosis not present

## 2017-01-05 DIAGNOSIS — F331 Major depressive disorder, recurrent, moderate: Secondary | ICD-10-CM

## 2017-01-05 MED ORDER — ARIPIPRAZOLE 5 MG PO TABS: 5.0000 mg | ORAL_TABLET | Freq: Every day | ORAL | 1 refills | Status: DC

## 2017-01-05 NOTE — Patient Instructions (Signed)
Change Abilify from 2 mg to the 5 mg tablet  Continue other medications unchanged

## 2017-01-05 NOTE — Progress Notes (Signed)
BH MD/PA/NP OP Progress Note  01/05/2017 1:21 PM Vincent [name]  MRN:  [id number]  Chief Complaint:  HPI: Vincent [name] reports that things have been going well with his mood, and reports that his paranoia is significantly decreased.  He wonders about increasing the dose of Abilify.  He reports that he is sleeping much better with [name] 45 mg.  He continues to work with his individual therapist in office.  I spent time discussing the risks and benefits of aripiprazole and antipsychotic use.  He agrees to increase Abilify to 5 mg and follow-up with this writer in 2-3 months or sooner if needed.  He denies any acute safety issues.  I reviewed the recent sleep study results with him, and the diagnosis of mild sleep apnea, not indicating CPAP at this time.  Visit Diagnosis:    ICD-10-CM   1. Moderate episode of recurrent major depressive disorder (HCC) F33.1   2. Chronic post-traumatic stress disorder (PTSD) F43.12 ARIPiprazole (ABILIFY) 5 MG tablet  3. Psychophysiological insomnia F51.04   4. Mild cognitive impairment G31.84     Past Psychiatric History: See intake H&P for full details. Reviewed, with no updates at this time.   Past Medical History:  Past Medical History:  Diagnosis Date  . Anxiety   . Arthritis   . Bipolar 1 disorder ([place])   . Chronic pain syndrome    back  . COPD (chronic obstructive pulmonary disease) ([place])   . Gastroparesis   . GERD (gastroesophageal reflux disease)   . Hiatal hernia   . History of bladder cancer urologist-  dr Consuella Lose   papillay TCC (Ta G1)  s/p TURBT and chemo instillation 2014  . History of encephalopathy 05/27/2015   admission w/ acute encephalopathy thought to be secondary to pain meds and COPD  . History of gastric ulcer   . History of kidney stones   . History of TIA (transient ischemic attack) 2008    no residual  . History of traumatic head injury 2010   w/ LOC  per pt needed stitches  . Hyperlipidemia   . Hypothyroidism    . Insomnia    per sleep study 04-19-2015 without sleep apnea  . Neuropathy in diabetes ([place])    LOWER EXTREMITIES  . Poor historian   . Seizures, transient [facility]) neurologist-  dr Krista Blue--  differential dx complex partial seizure .vs.  mood disorder .vs.  pseudoseizure--  negative EEG's   confusion episodes and staring spells since 11/ 2015  . Transient confusion NEUOROLOGIST-  DR YAN   Episodes since 11/ 2015--  neurologist dx  differential complex partial seizure  .vs. mood disorder . vs. pseudoseizure  . Type 2 diabetes mellitus treated with insulin ([place])   . Urinary hesitancy     Past Surgical History:  Procedure Laterality Date  . CARDIAC CATHETERIZATION  12-27-2001  DR Einar Gip  &  05-26-2009  DR Irish Lack   RESULTS FOR BOTH ARE NORMAL CORONARIES AND PERSERVED LVF/ EF 60%  . CARPAL TUNNEL RELEASE Right 09-16-2003  . CARPAL TUNNEL RELEASE Left 02/25/2015   Procedure: LEFT CARPAL TUNNEL RELEASE;  Surgeon: Leanora Cover, MD;  Location: [place];  Service: Orthopedics;  Laterality: Left;  . CYSTOSCOPY N/A 10/10/2012   Procedure: CYSTOSCOPY CLOT EVACUATION FULGERATION OF BLEEDERS ;  Surgeon: Claybon Jabs, MD;  Location: [facility];  Service: Urology;  Laterality: N/A;  . CYSTOSCOPY WITH BIOPSY N/A 11/26/2015   Procedure: CYSTOSCOPY WITH BIOPSY AND FULGURATION;  Surgeon:  Kathie Rhodes, MD;  Location: [facility];  Service: Urology;  Laterality: N/A;  . LAPAROSCOPIC CHOLECYSTECTOMY  11-17-2010  . NEGATIVE SLEEP STUDY  04-19-2015  in epic  . ORCHIECTOMY Right 02/21/2016   Procedure: SCROTAL ORCHIECTOMY with TESTICULAR PROSTHESIS IMPLANT;  Surgeon: Kathie Rhodes, MD;  Location[facility];  Service: Urology;  Laterality: Right;  . ROTATOR CUFF REPAIR Right 12/2004  . TRANSURETHRAL RESECTION OF BLADDER TUMOR N/A 08/09/2012   Procedure: TRANSURETHRAL RESECTION OF BLADDER TUMOR (TURBT) WITH GYRUS WITH MITOMYCIN C;  Surgeon: Claybon Jabs,  MD;  Location: [facility];  Service: Urology;  Laterality: N/A;  . TRANSURETHRAL RESECTION OF BLADDER TUMOR WITH GYRUS (TURBT-GYRUS) N/A 02/27/2014   Procedure: TRANSURETHRAL RESECTION OF BLADDER TUMOR WITH GYRUS (TURBT-GYRUS);  Surgeon: Claybon Jabs, MD;  Location: [facility];  Service: Urology;  Laterality: N/A;    Family Psychiatric History: See intake H&P for full details. Reviewed, with no updates at this time.   Family History:  Family History  Problem Relation Age of Onset  . Diabetes Mother   . Heart attack Mother   . Diabetes Father   . Hypertension Father   . Heart attack Father   . Alcohol abuse Father     Social History:  Social History   Social History  . Marital status: Married    Spouse name: N/A  . Number of children: 3  . Years of education: GED   Occupational History  . Warden/ranger of company   Social History Main Topics  . Smoking status: Current Every Day Smoker    Packs/day: 1.00    Years: 25.00    Types: Cigarettes  . Smokeless tobacco: Never Used     Comment: Has cut back to 1 pack a day  . Alcohol use No  . Drug use: No  . Sexual activity: Yes    Partners: Female    Birth control/ protection: None   Other Topics Concern  . None   Social History Narrative   Lives at home with his wife and children.   Left-handed.   3-4 cups caffeine per day.    Allergies:  Allergies  Allergen Reactions  . Celebrex [Celecoxib] Anaphylaxis and Rash  . Hydrocodone Rash and Other (See Comments)    "blisters developed on arms"  . Sulfa Antibiotics Rash    Metabolic Disorder Labs: Lab Results  Component Value Date   HGBA1C 9.1 (H) 05/06/2016   MPG 214 05/06/2016   MPG 166 (H) 03/06/2014   No results found for: PROLACTIN Lab Results  Component Value Date   CHOL 277 (H) 05/06/2016   TRIG 431 (H) 05/06/2016   HDL 34 (L) 05/06/2016   CHOLHDL 8.1 05/06/2016   VLDL UNABLE TO CALCULATE IF  TRIGLYCERIDE OVER 400 mg/dL 05/06/2016   LDLCALC UNABLE TO CALCULATE IF TRIGLYCERIDE OVER 400 mg/dL 05/06/2016   LDLCALC  05/27/2009    UNABLE TO CALCULATE IF TRIGLYCERIDE OVER 400 mg/dL        Total Cholesterol/HDL:CHD Risk Coronary Heart Disease Risk Table                     Men   Women  1/2 Average Risk   3.4   3.3  Average Risk       5.0   4.4  2 X Average Risk   9.6   7.1  3 X Average Risk  23.4  11.0        Use the calculated Patient Ratio above and the CHD Risk Table to determine the patient's CHD Risk.        ATP III CLASSIFICATION (LDL):  <100     mg/dL   Optimal  100-129  mg/dL   Near or Above                    Optimal  130-159  mg/dL   Borderline  160-189  mg/dL   High  >190     mg/dL   Very High   Lab Results  Component Value Date   TSH 3.735 05/06/2016   TSH 3.225 03/05/2014    Therapeutic Level Labs: Lab Results  Component Value Date   LITHIUM 0.35 (L) 05/28/2015   Lab Results  Component Value Date   VALPROATE 17 (L) 05/06/2016   No components found for:  CBMZ  Current Medications: Current Outpatient Prescriptions  Medication Sig Dispense Refill  . ALPRAZolam (XANAX) 0.5 MG tablet Take 1 tablet (0.5 mg total) by mouth 3 (three) times daily as needed for anxiety (anger, panic). 90 tablet 1  . ARIPiprazole (ABILIFY) 5 MG tablet Take 1 tablet (5 mg total) by mouth daily. 90 tablet 1  . atorvastatin (LIPITOR) 40 MG tablet Take 1 tablet (40 mg total) by mouth daily at 6 PM. 30 tablet 1  . Cholecalciferol (VITAMIN D) 2000 units tablet Take 1 tablet (2,000 Units total) by mouth daily. 90 tablet 1  . clindamycin (CLEOCIN) 150 MG capsule     . fentaNYL (DURAGESIC - DOSED MCG/HR) 75 MCG/HR Place 100 mcg onto the skin every 3 (three) days.     . folic acid (FOLVITE) 1 MG tablet Take 1 mg by mouth daily.     Marland Kitchen gabapentin (NEURONTIN) 600 MG tablet Take 2 tablets (1,200 mg total) by mouth 2 (two) times daily. 120 tablet 3  . hydroxychloroquine (PLAQUENIL) 200 MG  tablet Take by mouth daily.    . insulin lispro protamine-lispro (HUMALOG 75/25 MIX) (75-25) 100 UNIT/ML SUSP injection Inject into the skin.    Marland Kitchen lamoTRIgine (LAMICTAL) 100 MG tablet Take 2 tablets (200 mg total) by mouth 2 (two) times daily. 360 tablet 4  . levothyroxine (SYNTHROID, LEVOTHROID) 25 MCG tablet Take 25 mcg by mouth daily before breakfast.     . Methotrexate, PF, (RASUVO) 25 MG/0.5ML SOAJ Inject 25 mg into the skin every 7 (seven) days.    . mirtazapine ([name]) 45 MG tablet Take 1 tablet (45 mg total) by mouth at bedtime. 90 tablet 1  . Naloxegol Oxalate (MOVANTIK PO) Take 25 mg by mouth daily.    Marland Kitchen omeprazole (PRILOSEC) 20 MG capsule Take 20 mg by mouth every morning.     Marland Kitchen oxyCODONE-acetaminophen (PERCOCET/ROXICET) 5-325 MG tablet     . pregabalin (LYRICA) 100 MG capsule Take 100-200 mg by mouth 2 (two) times daily. Pt takes one capsule in the am and two at night.    . venlafaxine XR (EFFEXOR-XR) 150 MG 24 hr capsule Take 2 capsules (300 mg total) by mouth daily with breakfast. 90 capsule 1   No current facility-administered medications for this visit.      Musculoskeletal: Strength & Muscle Tone: within normal limits Gait & Station: normal Patient leans: N/A  Psychiatric Specialty Exam: ROS  Blood pressure (!) 160/68, pulse 86, height 6' (1.829 m), weight 220 lb 6.4 oz (100 kg).Body mass index is 29.89 kg/m.  General Appearance: Casual and Fairly  Groomed  Eye Contact:  Good  Speech:  Clear and Coherent  Volume:  Normal  Mood:  Euthymic  Affect:  Congruent  Thought Process:  Coherent and Descriptions of Associations: Intact  Orientation:  Full (Time, Place, and Person)  Thought Content: Logical   Suicidal Thoughts:  No  Homicidal Thoughts:  No  Memory:  Recent;   Good  Judgement:  Fair  Insight:  Fair  Psychomotor Activity:  Normal  Concentration:  Concentration: Good  Recall:  Good  Fund of Knowledge: Good  Language: Good  Akathisia:  Negative  Handed:   Right  AIMS (if indicated): not done  Assets:  Communication Skills Desire for Improvement Financial Resources/Insurance Housing Intimacy  ADL's:  Intact  Cognition: WNL  Sleep:  Good   Screenings: AUDIT     Admission (Discharged) from 05/05/2016 in [place]  Alcohol Use Disorder Identification Test Final Score (AUDIT)  1    Mini-Mental     Office Visit from 11/04/2014 in [facility]  Total Score (max 30 points )  26    PHQ2-9     Office Visit from 03/24/2015 in [facility]  PHQ-2 Total Score  2  PHQ-9 Total Score  12       Assessment and Plan:  Vincent [name] presents today for depression management and PTSD med management.  He has had some MDD with psychotic features, which appears to be improving with Abilify.  He has had consistent sleep, and reports a reduction in paranoia.  He continues to actively engage in individual therapy at this clinic.  We will follow-up in 3 months or sooner if needed.  1. Moderate episode of recurrent major depressive disorder ([place])   2. Chronic post-traumatic stress disorder (PTSD)   3. Psychophysiological insomnia   4. Mild cognitive impairment     Status of current problems: gradually improving  Labs Ordered: No orders of the defined types were placed in this encounter.   Labs Reviewed: n/a  Collateral Obtained/Records Reviewed: Reviewed polysomnography results with patient  Plan:  Continue Effexor 300 mg in the morning Continue [name] 45 mg nightly Continue Xanax twice a day as needed Increase Abilify to 5 mg Return to clinic in 3 months  I spent 15 minutes with the patient in direct face-to-face clinical care.  Greater than 50% of this time was spent in counseling and coordination of care with the patient.    Aundra Dubin, MD 01/05/2017, 1:21 PM

## 2017-01-08 ENCOUNTER — Ambulatory Visit (INDEPENDENT_AMBULATORY_CARE_PROVIDER_SITE_OTHER): Payer: 59 | Admitting: Psychology

## 2017-01-08 DIAGNOSIS — F331 Major depressive disorder, recurrent, moderate: Secondary | ICD-10-CM

## 2017-01-08 DIAGNOSIS — F4312 Post-traumatic stress disorder, chronic: Secondary | ICD-10-CM

## 2017-01-08 NOTE — Progress Notes (Signed)
   THERAPIST PROGRESS NOTE  Session Time: 10am-10.30am  Participation Level: Active  Behavioral Response: Well GroomedAlertaffect bright  Type of Therapy: Individual Therapy  Treatment Goals addressed: Diagnosis: PTSD, MDD and goal 1.  Interventions: CBT and Supportive  Summary: Vincent [name] is a 51 y.o. male who presents with full and bright affect.  Pt thanked counselor for working in today as his scheduled changed for the week.  Pt reported [place] to his [place] trip this week w/ friends.  Pt reported that predinsoe prescribed by another doctor made blood sugar increase significantly and only stressor last week.  Pt reported that his sleep continues to be improved- sleeping 7hours a night and having positive impact on mood.  Pt reports no paranoia or hallucinations.  Pt reports interactions w/ family continued to be improved. Pt reported that even improved w/ son and acceptance for decision to marry.  Pt reported that things are working out to buy the house they want and will greatly benefit because bigger space, better condition and mortgage payment will be less than rent from house leasing.  Pt reports that he has also gotten a parttime job w/ [employer] working [employer] and [employer].  Pt feels good about this step.  Suicidal/Homicidal: Nowithout intent/plan  Therapist Response: Assessed pt current functioning per pt report.t  Processed w/pt continued improvements and pt role in self care and engaging w/ friends/family.  Explored w/ pt upcoming transitions.  Plan: Return again in 1 weeks.  Diagnosis: PtSD, MDD    Donne Baley, [place] 01/08/2017

## 2017-01-09 ENCOUNTER — Ambulatory Visit (HOSPITAL_COMMUNITY): Payer: Self-pay | Admitting: Psychology

## 2017-01-11 ENCOUNTER — Ambulatory Visit (HOSPITAL_COMMUNITY): Payer: Self-pay | Admitting: Licensed Clinical Social Worker

## 2017-01-11 ENCOUNTER — Ambulatory Visit (HOSPITAL_COMMUNITY): Payer: Self-pay | Admitting: Psychology

## 2017-01-23 ENCOUNTER — Ambulatory Visit (HOSPITAL_COMMUNITY): Payer: Self-pay | Admitting: Psychology

## 2017-01-23 ENCOUNTER — Ambulatory Visit (HOSPITAL_COMMUNITY): Payer: Self-pay | Admitting: Licensed Clinical Social Worker

## 2017-02-06 ENCOUNTER — Ambulatory Visit (HOSPITAL_COMMUNITY): Payer: Self-pay | Admitting: Psychology

## 2017-02-06 ENCOUNTER — Ambulatory Visit (HOSPITAL_COMMUNITY): Payer: Self-pay | Admitting: Licensed Clinical Social Worker

## 2017-02-19 ENCOUNTER — Ambulatory Visit (HOSPITAL_COMMUNITY): Payer: Self-pay | Admitting: Licensed Clinical Social Worker

## 2017-02-19 ENCOUNTER — Ambulatory Visit (HOSPITAL_COMMUNITY): Payer: Self-pay | Admitting: Psychology

## 2017-02-20 ENCOUNTER — Ambulatory Visit (INDEPENDENT_AMBULATORY_CARE_PROVIDER_SITE_OTHER): Payer: 59 | Admitting: Psychology

## 2017-02-20 DIAGNOSIS — F4312 Post-traumatic stress disorder, chronic: Secondary | ICD-10-CM

## 2017-02-20 DIAGNOSIS — F330 Major depressive disorder, recurrent, mild: Secondary | ICD-10-CM | POA: Diagnosis not present

## 2017-02-20 NOTE — Progress Notes (Signed)
   THERAPIST PROGRESS NOTE  Session Time: 2:30pm-3.05pm  Participation Level: Active  Behavioral Response: Well GroomedAlertaffect bright  Type of Therapy: Individual Therapy  Treatment Goals addressed: Diagnosis: PtSD, MDD and goal 1.  Interventions: CBT and Supportive  Summary: Vincent [name] is a 51 y.o. male who presents with affect full and bright.  Pt reported that he has been doing well- no major conflicts, meltdowns or depressed or anger.  Pt reported that couple of bad days since last session.  pt reported yesterday was more irritable and aware that likely related to not having medication yesterday until was able to get refill.  Pt reported that he has been working at [employer] a for over a month now and this has been positive.  Pt reports gets out of house, a routine and less time to dwell on things.  Pt reported on positive family interactions and plans for the holidays.     Suicidal/Homicidal: Nowithout intent/plan  Therapist Response: Assessed pt current functioning per pt report.  Processed w/pt coping w/ transition to work and benefit of the routine and time engaged by planned activity.  Explored family interactions and how pt is working to resolve any conflicts or managing stress and anxeity.  Plan: Return again in 3-4 weeks. Pt reports plan to f/u monthly w/ current work schedule.  Diagnosis: PTSD, MDD   Nyeli Holtmeyer, [facility] 02/20/2017

## 2017-02-26 ENCOUNTER — Other Ambulatory Visit (HOSPITAL_COMMUNITY): Payer: Self-pay | Admitting: Psychiatry

## 2017-02-26 ENCOUNTER — Other Ambulatory Visit (HOSPITAL_COMMUNITY): Payer: Self-pay

## 2017-02-26 DIAGNOSIS — F4312 Post-traumatic stress disorder, chronic: Secondary | ICD-10-CM

## 2017-02-26 MED ORDER — ALPRAZOLAM 0.5 MG PO TABS: 0.5000 mg | ORAL_TABLET | Freq: Three times a day (TID) | ORAL | 0 refills | Status: DC | PRN

## 2017-03-02 ENCOUNTER — Ambulatory Visit (HOSPITAL_COMMUNITY): Payer: Self-pay | Admitting: Psychiatry

## 2017-03-05 ENCOUNTER — Ambulatory Visit (HOSPITAL_COMMUNITY): Payer: Self-pay | Admitting: Psychology

## 2017-03-14 ENCOUNTER — Ambulatory Visit (HOSPITAL_COMMUNITY): Payer: Self-pay | Admitting: Psychology

## 2017-03-20 DIAGNOSIS — N5201 Erectile dysfunction due to arterial insufficiency: Secondary | ICD-10-CM | POA: Diagnosis not present

## 2017-03-20 DIAGNOSIS — Z8551 Personal history of malignant neoplasm of bladder: Secondary | ICD-10-CM | POA: Diagnosis not present

## 2017-03-20 DIAGNOSIS — N400 Enlarged prostate without lower urinary tract symptoms: Secondary | ICD-10-CM | POA: Diagnosis not present

## 2017-03-21 DIAGNOSIS — M47816 Spondylosis without myelopathy or radiculopathy, lumbar region: Secondary | ICD-10-CM | POA: Diagnosis not present

## 2017-03-21 DIAGNOSIS — E0849 Diabetes mellitus due to underlying condition with other diabetic neurological complication: Secondary | ICD-10-CM | POA: Diagnosis not present

## 2017-03-21 DIAGNOSIS — M5136 Other intervertebral disc degeneration, lumbar region: Secondary | ICD-10-CM | POA: Diagnosis not present

## 2017-03-21 DIAGNOSIS — F4310 Post-traumatic stress disorder, unspecified: Secondary | ICD-10-CM | POA: Diagnosis not present

## 2017-03-28 ENCOUNTER — Ambulatory Visit (HOSPITAL_COMMUNITY): Payer: 59 | Admitting: Psychology

## 2017-03-28 ENCOUNTER — Encounter (HOSPITAL_COMMUNITY): Payer: Self-pay

## 2017-04-03 DIAGNOSIS — M533 Sacrococcygeal disorders, not elsewhere classified: Secondary | ICD-10-CM | POA: Diagnosis not present

## 2017-04-04 ENCOUNTER — Ambulatory Visit (HOSPITAL_COMMUNITY): Payer: 59 | Admitting: Psychology

## 2017-04-04 ENCOUNTER — Encounter (HOSPITAL_COMMUNITY): Payer: Self-pay

## 2017-04-10 ENCOUNTER — Ambulatory Visit (HOSPITAL_COMMUNITY): Payer: Self-pay | Admitting: Psychology

## 2017-04-16 ENCOUNTER — Other Ambulatory Visit (HOSPITAL_COMMUNITY): Payer: Self-pay

## 2017-04-16 ENCOUNTER — Ambulatory Visit (INDEPENDENT_AMBULATORY_CARE_PROVIDER_SITE_OTHER): Payer: 59 | Admitting: Psychology

## 2017-04-16 DIAGNOSIS — F4312 Post-traumatic stress disorder, chronic: Secondary | ICD-10-CM

## 2017-04-16 DIAGNOSIS — F330 Major depressive disorder, recurrent, mild: Secondary | ICD-10-CM | POA: Diagnosis not present

## 2017-04-16 MED ORDER — ALPRAZOLAM 0.5 MG PO TABS
0.5000 mg | ORAL_TABLET | Freq: Three times a day (TID) | ORAL | 0 refills | Status: DC | PRN
Start: 2017-04-16 — End: 2017-06-07

## 2017-04-16 NOTE — Progress Notes (Signed)
   THERAPIST PROGRESS NOTE  Session Time: 9.04am-9.38am  Participation Level: Active  Behavioral Response: Well GroomedAlertaffect bright  Type of Therapy: Individual Therapy  Treatment Goals addressed: Diagnosis: MDD, PTSD and goal 1.  Interventions: CBT and Supportive  Summary: NEELY CECENA is a 52 y.o. male who presents with affect wnl.  Pt reported that he has been doing well overall. Pt reports he is sleeping well and mood overall good.  Pt recognized over past 2 weeks getting more easily irritate and quicker to snap.  Pt reported that not feeling more stressed, pt feels that maybe he has become complacent. Pt still reported that not handling poorly and not maintaining anger.  Pt discussed that he is working more than 15-20 hours first discussed- working 35+ hours lately. Pt reported he has addressed w/ [employer].  Pt discussed about stress of daughter spending a lot of time w/ boy of different color.  Pt discussed how grow up that didn't date outside your race. Pt discussed how he is trying to be open minded but still struggles w/ this.  Pt did acknowledge that not going to let this ruin relationship w/ daughter.    Suicidal/Homicidal: Nowithout intent/plan  Therapist Response: Assessed pt current functioning per pt report. Processed w/pt coping w/ increased irritable, snappy behavior.  Explored potential contributing factors.  Discussed assertive communication skills and deescalating. Reflected positive steps and awareness.   Plan: Return again in 3-4 weeks.  Diagnosis: MDD, PTSD   Everley Evora, [place] 04/16/2017

## 2017-04-17 ENCOUNTER — Ambulatory Visit (HOSPITAL_COMMUNITY): Payer: Self-pay | Admitting: Psychology

## 2017-04-18 DIAGNOSIS — M47816 Spondylosis without myelopathy or radiculopathy, lumbar region: Secondary | ICD-10-CM | POA: Diagnosis not present

## 2017-04-18 DIAGNOSIS — M5136 Other intervertebral disc degeneration, lumbar region: Secondary | ICD-10-CM | POA: Diagnosis not present

## 2017-04-18 DIAGNOSIS — F4310 Post-traumatic stress disorder, unspecified: Secondary | ICD-10-CM | POA: Diagnosis not present

## 2017-04-18 DIAGNOSIS — M533 Sacrococcygeal disorders, not elsewhere classified: Secondary | ICD-10-CM | POA: Diagnosis not present

## 2017-05-10 DIAGNOSIS — M064 Inflammatory polyarthropathy: Secondary | ICD-10-CM | POA: Diagnosis not present

## 2017-05-14 ENCOUNTER — Ambulatory Visit (INDEPENDENT_AMBULATORY_CARE_PROVIDER_SITE_OTHER): Payer: 59 | Admitting: Psychology

## 2017-05-14 DIAGNOSIS — F331 Major depressive disorder, recurrent, moderate: Secondary | ICD-10-CM | POA: Diagnosis not present

## 2017-05-14 DIAGNOSIS — F4312 Post-traumatic stress disorder, chronic: Secondary | ICD-10-CM | POA: Diagnosis not present

## 2017-05-14 NOTE — Progress Notes (Signed)
   THERAPIST PROGRESS NOTE  Session Time: 9am-9.30am  Participation Level: Active  Behavioral Response: Well GroomedAlertaffect wnl  Type of Therapy: Individual Therapy  Treatment Goals addressed: Diagnosis: MDD, PTSD and goal 1.  Interventions: CBT, Supportive and Family Systems  Summary: Vincent [name] is a 52 y.o. male who presents with affect full and bright. Pt reports he has been struggling w/ sleep- waking several times a night- mind racing.  Pt reported that he is dealing w/ stress of interactions w/ daughter who is also dealing w/ depression and anxiety.  Pt reported that they are arguing a lot about minor things. Pt reports at times able to use his skills and not engage and other times irritability gets the best. Pt discussed ways they can interact w/out focusing on discussing what is going on in the day to day or stressors.  Pt also reports looking forward to some upcoming trips- one ATV trip w/ church guys, the other vacation to [place] w/ extended family.     Suicidal/Homicidal: Nowithout intent/plan  Therapist Response: Assessed pt current functioning per pt report. Processed w/pt coping w/ stresssor at home, increased irritability and ways of interacting w/ teenage daughter to focus on positives.  Explored w/pt use of self care to manage decreasing stressors.   Plan: Return again in 3 weeks.  Diagnosis: MDD, PTSD   Melvina Pangelinan, [place] 05/14/2017

## 2017-05-16 ENCOUNTER — Emergency Department (HOSPITAL_COMMUNITY)
Admission: EM | Admit: 2017-05-16 | Discharge: 2017-05-17 | Disposition: A | Discharge status: Home / Self Care | Payer: 59 | Attending: Emergency Medicine | Admitting: Emergency Medicine

## 2017-05-16 ENCOUNTER — Encounter (HOSPITAL_COMMUNITY): Payer: Self-pay

## 2017-05-16 ENCOUNTER — Emergency Department (HOSPITAL_COMMUNITY): Payer: 59

## 2017-05-16 DIAGNOSIS — R569 Unspecified convulsions: Secondary | ICD-10-CM | POA: Diagnosis not present

## 2017-05-16 DIAGNOSIS — R404 Transient alteration of awareness: Secondary | ICD-10-CM | POA: Diagnosis not present

## 2017-05-16 DIAGNOSIS — E119 Type 2 diabetes mellitus without complications: Secondary | ICD-10-CM | POA: Diagnosis not present

## 2017-05-16 DIAGNOSIS — E039 Hypothyroidism, unspecified: Secondary | ICD-10-CM | POA: Diagnosis not present

## 2017-05-16 DIAGNOSIS — Z79899 Other long term (current) drug therapy: Secondary | ICD-10-CM | POA: Diagnosis not present

## 2017-05-16 DIAGNOSIS — R531 Weakness: Secondary | ICD-10-CM | POA: Diagnosis not present

## 2017-05-16 DIAGNOSIS — Z8673 Personal history of transient ischemic attack (TIA), and cerebral infarction without residual deficits: Secondary | ICD-10-CM | POA: Diagnosis not present

## 2017-05-16 DIAGNOSIS — G40909 Epilepsy, unspecified, not intractable, without status epilepticus: Secondary | ICD-10-CM | POA: Diagnosis not present

## 2017-05-16 DIAGNOSIS — R55 Syncope and collapse: Secondary | ICD-10-CM | POA: Insufficient documentation

## 2017-05-16 DIAGNOSIS — J449 Chronic obstructive pulmonary disease, unspecified: Secondary | ICD-10-CM | POA: Diagnosis not present

## 2017-05-16 DIAGNOSIS — Z8551 Personal history of malignant neoplasm of bladder: Secondary | ICD-10-CM | POA: Diagnosis not present

## 2017-05-16 DIAGNOSIS — F17210 Nicotine dependence, cigarettes, uncomplicated: Secondary | ICD-10-CM | POA: Insufficient documentation

## 2017-05-16 DIAGNOSIS — Z794 Long term (current) use of insulin: Secondary | ICD-10-CM | POA: Diagnosis not present

## 2017-05-16 DIAGNOSIS — M791 Myalgia, unspecified site: Secondary | ICD-10-CM | POA: Diagnosis not present

## 2017-05-16 LAB — CBC
HCT: 44.3 % (ref 39.0–52.0)
Hemoglobin: 16 g/dL (ref 13.0–17.0)
MCH: 30.5 pg (ref 26.0–34.0)
MCHC: 36.1 g/dL — ABNORMAL HIGH (ref 30.0–36.0)
MCV: 84.5 fL (ref 78.0–100.0)
Platelets: 233 10*3/uL (ref 150–400)
RBC: 5.24 MIL/uL (ref 4.22–5.81)
RDW: 13.2 % (ref 11.5–15.5)
WBC: 9.1 10*3/uL (ref 4.0–10.5)

## 2017-05-16 LAB — BASIC METABOLIC PANEL
Anion gap: 15 (ref 5–15)
BUN: 17 mg/dL (ref 6–20)
CO2: 24 mmol/L (ref 22–32)
Calcium: 9.3 mg/dL (ref 8.9–10.3)
Chloride: 97 mmol/L — ABNORMAL LOW (ref 101–111)
Creatinine, Ser: 1.14 mg/dL (ref 0.61–1.24)
GFR calc Af Amer: >60 mL/min (ref >60)
GFR calc non Af Amer: >60 mL/min (ref >60)
Glucose, Bld: 295 mg/dL — ABNORMAL HIGH (ref 65–99)
Potassium: 4.2 mmol/L (ref 3.5–5.1)
Sodium: 136 mmol/L (ref 135–145)

## 2017-05-16 LAB — CBG MONITORING, ED: Glucose-Capillary: 266 mg/dL — ABNORMAL HIGH (ref 65–99)

## 2017-05-16 MED ORDER — SODIUM CHLORIDE 0.9 % IV BOLUS (SEPSIS)
1000.0000 mL | Freq: Once | INTRAVENOUS | Status: AC
  Administered 2017-05-17: 1000 mL via INTRAVENOUS

## 2017-05-16 NOTE — ED Triage Notes (Addendum)
Pt with hx of seizures and syncopal episodes via EMS for syncopal episode that occurred while pt was in the shower. LSN approx 1700. Per EMS, pt was smoking outside when he felt as though he z'passed out or maybe had a seizure." Pt then went to take a shower and woke up on the floor of the shower with no recollection of LOC. Pt also reports generalized muscle spasms and generalized weakness. A&Ox4. EMS VS: 335 CBG, 133/83, 97 HR NSR, RR 16, 98% on RA. 20 G L hand

## 2017-05-16 NOTE — ED Notes (Signed)
Patient transported to CT 

## 2017-05-16 NOTE — ED Provider Notes (Addendum)
[facility] EMERGENCY DEPARTMENT Provider Note   CSN: [id number] Arrival date & time: 05/16/17  2214     History   Chief Complaint Chief Complaint  Patient presents with  . Loss of Consciousness    HPI Vincent [name] is a 52 y.o. male.   Patient with history of seizure-like disorder currently on lamotrigine and compliant, history of PTSD, chronic benzo use, encephalopathy, bipolar disorder --presents tonight with confusion, seizure-like activity versus syncope.  Patient was in his usual state of health and was outside on his porch smoking when he began having a jittery sensation.  He then had several minutes of shaking over his entire body.  He was awake for this.  When this ceased he was able to go in and attempted to take a shower.  He states that he check to see if the water was warm and then woke up later in the bathtub with water spraying on his face.  He states that he had difficulty moving his bilateral arms and legs but eventually was able to get out of the bathtub and crawl to his bedroom.  He was able to text his wife who sent the patient's daughter over to check on him.  They noted that his speech was very slowed and slurred which is unusual after these kind of episodes and EMS was called to bring him to the hospital.  Patient denies any recent illness.  No chest pain or shortness of breath at any time.  No headache or neck pain.  No fevers, nausea, vomiting, or diarrhea.  No lateral tongue biting or incontinence.  Patient states that he does not drink alcohol and has been taking Xanax regularly.  He does not take tramadol anymore.       Past Medical History:  Diagnosis Date  . Anxiety   . Arthritis   . Bipolar 1 disorder ([place])   . Chronic pain syndrome    back  . COPD (chronic obstructive pulmonary disease) ([place])   . Gastroparesis   . GERD (gastroesophageal reflux disease)   . Hiatal hernia   . History of bladder cancer urologist-  dr Consuella Lose   papillay TCC (Ta G1)  s/p TURBT and chemo instillation 2014  . History of encephalopathy 05/27/2015   admission w/ acute encephalopathy thought to be secondary to pain meds and COPD  . History of gastric ulcer   . History of kidney stones   . History of TIA (transient ischemic attack) 2008    no residual  . History of traumatic head injury 2010   w/ LOC  per pt needed stitches  . Hyperlipidemia   . Hypothyroidism   . Insomnia    per sleep study 04-19-2015 without sleep apnea  . Neuropathy in diabetes ([place])    LOWER EXTREMITIES  . Poor historian   . Seizures, transient [facility]) neurologist-  dr Krista Blue--  differential dx complex partial seizure .vs.  mood disorder .vs.  pseudoseizure--  negative EEG's   confusion episodes and staring spells since 11/ 2015  . Transient confusion NEUOROLOGIST-  DR YAN   Episodes since 11/ 2015--  neurologist dx  differential complex partial seizure  .vs. mood disorder . vs. pseudoseizure  . Type 2 diabetes mellitus treated with insulin ([place])   . Urinary hesitancy     Patient Active Problem List   Diagnosis Date Noted  . Seizure disorder ([place]) 12/19/2016  . Essential tremor 12/19/2016  . Polypharmacy 12/19/2016  . Chronic post-traumatic stress disorder (PTSD)  06/16/2016  . Tremor 06/16/2016  . Tobacco use disorder 05/05/2016  . Psychophysiological insomnia 06/02/2015  . Altered mental status 05/28/2015  . Acute bronchitis 05/28/2015  . Type 2 diabetes mellitus with hyperglycemia ([place]) 05/28/2015  . Hypothyroidism 05/28/2015  . History of rheumatoid arthritis 05/28/2015  . Chronic pain 05/28/2015  . Depression 03/18/2015  . Diabetes ([place]) 01/23/2015  . Obesity (BMI 30-39.9) 03/06/2014  . Acute encephalopathy 03/05/2014  . HLD (hyperlipidemia) 03/05/2014  . Anemia, normocytic normochromic 03/05/2014  . Altered mental state   . Protein-calorie malnutrition, severe ([place]) 10/09/2012  . Bladder tumor 08/08/2012  . Biliary dyskinesia 11/02/2010     Past Surgical History:  Procedure Laterality Date  . CARDIAC CATHETERIZATION  12-27-2001  DR Einar Gip  &  05-26-2009  DR Irish Lack   RESULTS FOR BOTH ARE NORMAL CORONARIES AND PERSERVED LVF/ EF 60%  . CARPAL TUNNEL RELEASE Right 09-16-2003  . CARPAL TUNNEL RELEASE Left 02/25/2015   Procedure: LEFT CARPAL TUNNEL RELEASE;  Surgeon: Leanora Cover, MD;  Location: [facility];  Service: Orthopedics;  Laterality: Left;  . CYSTOSCOPY N/A 10/10/2012   Procedure: CYSTOSCOPY CLOT EVACUATION FULGERATION OF BLEEDERS ;  Surgeon: Claybon Jabs, MD;  [facility]  Service: Urology;  Laterality: N/A;  . CYSTOSCOPY WITH BIOPSY N/A 11/26/2015   Procedure: CYSTOSCOPY WITH BIOPSY AND FULGURATION;  Surgeon: Kathie Rhodes, MD;  Location: [place] [facility];  Service: Urology;  Laterality: N/A;  . LAPAROSCOPIC CHOLECYSTECTOMY  11-17-2010  . NEGATIVE SLEEP STUDY  04-19-2015  in epic  . ORCHIECTOMY Right 02/21/2016   Procedure: SCROTAL ORCHIECTOMY with TESTICULAR PROSTHESIS IMPLANT;  Surgeon: Kathie Rhodes, MD;  Location: [facility];  Service: Urology;  Laterality: Right;  . ROTATOR CUFF REPAIR Right 12/2004  . TRANSURETHRAL RESECTION OF BLADDER TUMOR N/A 08/09/2012   Procedure: TRANSURETHRAL RESECTION OF BLADDER TUMOR (TURBT) WITH GYRUS WITH MITOMYCIN C;  Surgeon: Claybon Jabs, MD;  Location: [facility];  Service: Urology;  Laterality: N/A;  . TRANSURETHRAL RESECTION OF BLADDER TUMOR WITH GYRUS (TURBT-GYRUS) N/A 02/27/2014   Procedure: TRANSURETHRAL RESECTION OF BLADDER TUMOR WITH GYRUS (TURBT-GYRUS);  Surgeon: Claybon Jabs, MD;  Location: [facility];  Service: Urology;  Laterality: N/A;       Home Medications    Prior to Admission medications   Medication Sig Start Date End Date Taking? Authorizing Provider  ALPRAZolam Duanne Moron) 0.5 MG tablet Take 1 tablet (0.5 mg total) by mouth 3 (three) times daily as needed for  anxiety (anger, panic). 04/16/17 04/16/18  Aundra Dubin, MD  ARIPiprazole (ABILIFY) 5 MG tablet Take 1 tablet (5 mg total) by mouth daily. 01/05/17   Aundra Dubin, MD  atorvastatin (LIPITOR) 40 MG tablet Take 1 tablet (40 mg total) by mouth daily at 6 PM. 05/06/16   Pucilowska, Jolanta B, MD  Cholecalciferol (VITAMIN D) 2000 units tablet Take 1 tablet (2,000 Units total) by mouth daily. 11/23/16   Aundra Dubin, MD  clindamycin (CLEOCIN) 150 MG capsule  06/13/16   [provider]  fentaNYL (DURAGESIC - DOSED MCG/HR) 75 MCG/HR Place 100 mcg onto the skin every 3 (three) days.     [provider]  folic acid (FOLVITE) 1 MG tablet Take 1 mg by mouth daily.     [provider]  gabapentin (NEURONTIN) 600 MG tablet Take 2 tablets (1,200 mg total) by mouth 2 (two) times daily. 11/22/16   Eksir, Richard Miu, MD  hydroxychloroquine (PLAQUENIL)  200 MG tablet Take by mouth daily.    [provider]  insulin lispro protamine-lispro (HUMALOG 75/25 MIX) (75-25) 100 UNIT/ML SUSP injection Inject into the skin.    [provider]  lamoTRIgine (LAMICTAL) 100 MG tablet Take 2 tablets (200 mg total) by mouth 2 (two) times daily. 06/28/16   Marcial Pacas, MD  levothyroxine (SYNTHROID, LEVOTHROID) 25 MCG tablet Take 25 mcg by mouth daily before breakfast.     [provider]  Methotrexate, PF, (RASUVO) 25 MG/0.5ML SOAJ Inject 25 mg into the skin every 7 (seven) days.    [provider]  mirtazapine ([name]) 45 MG tablet Take 1 tablet (45 mg total) by mouth at bedtime. 12/11/16   Eksir, Richard Miu, MD  Naloxegol Oxalate (MOVANTIK PO) Take 25 mg by mouth daily.    [provider]  omeprazole (PRILOSEC) 20 MG capsule Take 20 mg by mouth every morning.     [provider]  oxyCODONE-acetaminophen (PERCOCET/ROXICET) 5-325 MG tablet  05/17/16   [provider]  pregabalin (LYRICA) 100 MG capsule Take 100-200 mg by  mouth 2 (two) times daily. Pt takes one capsule in the am and two at night.    [provider]  venlafaxine XR (EFFEXOR-XR) 150 MG 24 hr capsule Take 2 capsules (300 mg total) by mouth daily with breakfast. 02/26/17   Daron Offer, Richard Miu, MD    Family History Family History  Problem Relation Age of Onset  . Diabetes Mother   . Heart attack Mother   . Diabetes Father   . Hypertension Father   . Heart attack Father   . Alcohol abuse Father     Social History Social History   Tobacco Use  . Smoking status: Current Every Day Smoker    Packs/day: 1.00    Years: 25.00    Pack years: 25.00    Types: Cigarettes  . Smokeless tobacco: Never Used  . Tobacco comment: Has cut back to 1 pack a day  Substance Use Topics  . Alcohol use: No  . Drug use: No     Allergies   Celebrex [celecoxib]; Hydrocodone; and Sulfa antibiotics   Review of Systems Review of Systems  Constitutional: Negative for fever.  HENT: Negative for rhinorrhea and sore throat.   Eyes: Negative for redness.  Respiratory: Negative for cough.   Cardiovascular: Negative for chest pain.  Gastrointestinal: Negative for abdominal pain, diarrhea, nausea and vomiting.  Genitourinary: Negative for dysuria.  Musculoskeletal: Positive for myalgias (lower extremities).  Skin: Negative for rash.  Neurological: Positive for tremors, seizures, syncope, speech difficulty and weakness. Negative for light-headedness, numbness (decreased sensation) and headaches.     Physical Exam Updated Vital Signs BP 138/83 (BP Location: Right Arm)   Pulse 94   Temp 98.7 F (37.1 C) (Oral)   Resp 17   SpO2 95%   Physical Exam  Constitutional: He is oriented to person, place, and time. He appears well-developed and well-nourished.  HENT:  Head: Normocephalic and atraumatic.  Right Ear: Tympanic membrane, external ear and ear canal normal.  Left Ear: Tympanic membrane, external ear and ear canal normal.  Nose: Nose  normal.  Mouth/Throat: Uvula is midline, oropharynx is clear and moist and mucous membranes are normal.  Eyes: Conjunctivae, EOM and lids are normal. Pupils are equal, round, and reactive to light. Right eye exhibits no discharge. Left eye exhibits no discharge.  Neck: Normal range of motion. Neck supple.  Cardiovascular: Normal rate, regular rhythm and normal heart sounds.  Pulmonary/Chest: Effort normal and breath sounds normal.  Abdominal: Soft. There is no tenderness.  Musculoskeletal: Normal range of motion. He exhibits tenderness.       Cervical back: He exhibits normal range of motion, no tenderness and no bony tenderness.  Generalized lower extremity muscular tenderness, no swelling or redness. No clinical signs of DVT.   Neurological: He is alert and oriented to person, place, and time. He has normal strength. A sensory deficit (Reports decreased sensation distal R lower extremity) is present. No cranial nerve deficit. He exhibits normal muscle tone. He displays a negative Romberg sign. Coordination normal. GCS eye subscore is 4. GCS verbal subscore is 5. GCS motor subscore is 6.  Skin: Skin is warm and dry.  Psychiatric: He has a normal mood and affect.  Nursing note and vitals reviewed.    ED Treatments / Results  Labs (all labs ordered are listed, but only abnormal results are displayed) Labs Reviewed  BASIC METABOLIC PANEL - Abnormal; Notable for the following components:      Result Value   Chloride 97 (*)    Glucose, Bld 295 (*)    All other components within normal limits  CBC - Abnormal; Notable for the following components:   MCHC 36.1 (*)    All other components within normal limits  URINALYSIS, ROUTINE W REFLEX MICROSCOPIC - Abnormal; Notable for the following components:   Specific Gravity, Urine 1.031 (*)    Glucose, UA >=500 (*)    Protein, ur 30 (*)    Squamous Epithelial / LPF 0-5 (*)    All other components within normal limits  AMMONIA - Abnormal; Notable  for the following components:   Ammonia 52 (*)    All other components within normal limits  RAPID URINE DRUG SCREEN, HOSP PERFORMED - Abnormal; Notable for the following components:   Benzodiazepines POSITIVE (*)    All other components within normal limits  ACETAMINOPHEN LEVEL - Abnormal; Notable for the following components:   Acetaminophen (Tylenol), Serum <10 (*)    All other components within normal limits  CBG MONITORING, ED - Abnormal; Notable for the following components:   Glucose-Capillary 266 (*)    All other components within normal limits  HEPATIC FUNCTION PANEL  ETHANOL  SALICYLATE LEVEL  CK    EKG  EKG Interpretation  Date/Time:  Wednesday May 16 2017 22:18:59 EST Ventricular Rate:  97 PR Interval:    QRS Duration: 94 QT Interval:  356 QTC Calculation: 453 R Axis:   73 Text Interpretation:  Sinus rhythm Nonspecific T abnormalities, lateral leads Interpretation limited secondary to artifact Confirmed by Ripley Fraise [phone]) on 05/17/2017 12:22:10 AM       Radiology Ct Head Wo Contrast  Result Date: 05/17/2017 CLINICAL DATA:  Syncopal episode EXAM: CT HEAD WITHOUT CONTRAST TECHNIQUE: Contiguous axial images were obtained from the base of the skull through the vertex without intravenous contrast. COMPARISON:  05/28/2015 MRI, head CT 05/27/2015 FINDINGS: Brain: No acute territorial infarction, hemorrhage or intracranial mass is visualized. Ventricles are nonenlarged. Vascular: No hyperdense vessels. Scattered calcifications at the carotid siphons. Skull: Normal. Negative for fracture or focal lesion. Sinuses/Orbits: Mild mucosal thickening in the ethmoid sinuses. No acute orbital abnormality Other: None IMPRESSION: 1. Negative non contrasted CT appearance of the brain 2. Sinus disease Electronically Signed   By: Donavan Foil M.D.   On: 05/17/2017 00:03    Procedures Procedures (including critical care time)  Medications Ordered in ED Medications  sodium  chloride 0.9 %  bolus 1,000 mL (0 mLs Intravenous Stopped 05/17/17 0136)  sodium chloride 0.9 % bolus 1,000 mL (0 mLs Intravenous Stopped 05/17/17 0342)     Initial Impression / Assessment and Plan / ED Course  I have reviewed the triage vital signs and the nursing notes.  Pertinent labs & imaging results that were available during my care of the patient were reviewed by me and considered in my medical decision making (see chart for details).     Patient seen and examined.  Patient without any focal neurological deficits on exam.  Blood work and head CT ordered.  Review of previous records show the patient has had episodes of confusion spells with questionable seizure-like activity.  Unclear etiology of these symptoms.  Questionable polypharmacy contributing in the past.  Awaiting remainder of workup.  Vital signs reviewed and are as follows: BP 138/83 (BP Location: Right Arm)   Pulse 94   Temp 98.7 F (37.1 C) (Oral)   Resp 17   SpO2 95%   12:54 AM Patient discussed with Dr. Christy Gentles who will see patient.   4:58 AM The patient monitored for several hours.  Workup reviewed with Dr. Christy Gentles.  No concerning findings on workup which would indicate admission.  Patient was able to ambulate with some residual soreness in his legs but did so without any assistance. He states that he is feeling better.  Wife feels that he is also slowly returning to his baseline.  They are comfortable with discharged home.   Patient counseled to return if they have weakness in their arms or legs, slurred speech, trouble walking or talking, confusion, trouble with their balance, or if they have any other concerns. Patient verbalizes understanding and agrees with plan.   Discussed no indications for admission at this time.  I encouraged very close PCP follow-up and encouraged him to call for an appointment today.   Final Clinical Impressions(s) / ED Diagnoses   Final diagnoses:  Syncope, unspecified syncope  type  Seizure-like activity ([place])    Pt with syncope vs seizure. H/o seizure-like activity -- followed by neuro, on Lamictal.  Symptoms tonight similar to previous episodes.  He had some confusion afterwards and was very drowsy during ED visit initially.  This improved with time and monitoring in the emergency department.  Workup performed and does not demonstrate any new or concerning findings.  Head CT neg. Slightly elevated ammonia, however this is chronic.  No signs of infection.  EKG without findings of prolonged QTC, preexcitation, Brugada syndrome, heart block, enlarged heart.  Patient is on several different medications including benzodiazepines and opioids which could cause confusion as well.  Given improvement in the emergency department without additional seizure-like activity, feel that he can be discharged home with close PCP follow-up.   ED Discharge Orders    None       Carlisle Cater, Hershal Coria 05/17/17 [id number]  Ripley Fraise, MD 05/17/17 [phone]

## 2017-05-16 NOTE — ED Notes (Signed)
ED Provider at bedside. 

## 2017-05-17 DIAGNOSIS — R55 Syncope and collapse: Secondary | ICD-10-CM | POA: Diagnosis not present

## 2017-05-17 LAB — URINALYSIS, ROUTINE W REFLEX MICROSCOPIC
Bacteria, UA: NONE SEEN
Bilirubin Urine: NEGATIVE
Glucose, UA: >=500 mg/dL — AB
Hgb urine dipstick: NEGATIVE
Ketones, ur: NEGATIVE mg/dL
Leukocytes, UA: NEGATIVE
Nitrite: NEGATIVE
Protein, ur: 30 mg/dL — AB
Specific Gravity, Urine: 1.031 — ABNORMAL HIGH (ref 1.005–1.030)
pH: 5 (ref 5.0–8.0)

## 2017-05-17 LAB — HEPATIC FUNCTION PANEL
ALT: 20 U/L (ref 17–63)
AST: 28 U/L (ref 15–41)
Albumin: 3.9 g/dL (ref 3.5–5.0)
Alkaline Phosphatase: 85 U/L (ref 38–126)
Bilirubin, Direct: 0.3 mg/dL (ref 0.1–0.5)
Indirect Bilirubin: 0.8 mg/dL (ref 0.3–0.9)
Total Bilirubin: 1.1 mg/dL (ref 0.3–1.2)
Total Protein: 6.5 g/dL (ref 6.5–8.1)

## 2017-05-17 LAB — RAPID URINE DRUG SCREEN, HOSP PERFORMED
Amphetamines: NOT DETECTED
Barbiturates: NOT DETECTED
Benzodiazepines: POSITIVE — AB
Cocaine: NOT DETECTED
Opiates: NOT DETECTED
Tetrahydrocannabinol: NOT DETECTED

## 2017-05-17 LAB — AMMONIA: Ammonia: 52 umol/L — ABNORMAL HIGH (ref 9–35)

## 2017-05-17 LAB — CK: Total CK: 121 U/L (ref 49–397)

## 2017-05-17 LAB — ACETAMINOPHEN LEVEL: Acetaminophen (Tylenol), Serum: <10 ug/mL — ABNORMAL LOW (ref 10–30)

## 2017-05-17 LAB — SALICYLATE LEVEL: Salicylate Lvl: <7 mg/dL (ref 2.8–30.0)

## 2017-05-17 LAB — ETHANOL: Alcohol, Ethyl (B): <10 mg/dL (ref <10)

## 2017-05-17 MED ORDER — SODIUM CHLORIDE 0.9 % IV BOLUS (SEPSIS)
1000.0000 mL | Freq: Once | INTRAVENOUS | Status: AC
  Administered 2017-05-17: 1000 mL via INTRAVENOUS

## 2017-05-17 NOTE — ED Notes (Signed)
Ambulated pt to and from hallway bathroom, with only standby assist. Pt denies being dizzy or lightheaded. Only complaint is continued "residual soreness" from when his "legs were spasming."

## 2017-05-17 NOTE — ED Notes (Signed)
Pt states still unable to urinate. Bladder scan done with 514mL shown. Discussed possible in & out catheter with pt, who dislikes option. Pt given urinal and again encouraged to urinate on his own.

## 2017-05-17 NOTE — Discharge Instructions (Signed)
Please read and follow all provided instructions.  Your diagnoses today include:  1. Syncope, unspecified syncope type   2. Seizure-like activity ([place])     Tests performed today include:  CT of your head - normal  Blood counts and electrolytes, elevated blood sugar  Liver tests -- slightly elevated ammonia like you have had in the past without other findings  EKG -no concerning findings  Vital signs. See below for your results today.   Medications prescribed:   None  Take any prescribed medications only as directed.  Home care instructions:  Follow any educational materials contained in this packet.  Follow-up instructions: Please follow-up with your primary care provider in the next 2 days for further evaluation of your symptoms.   Return instructions:   Please return to the Emergency Department if you experience worsening symptoms.   Return if you have weakness in your arms or legs, slurred speech, trouble walking or talking, confusion, or trouble with your balance.   Please return if you have any other emergent concerns.  Additional Information:  Your vital signs today were: BP 125/73    Pulse 73    Temp 98.7 F (37.1 C) (Oral)    Resp 13    SpO2 93%  If your blood pressure (BP) was elevated above 135/85 this visit, please have this repeated by your doctor within one month. --------------

## 2017-05-17 NOTE — ED Provider Notes (Signed)
Patient seen/examined in the Emergency Department in conjunction with Midlevel Provider geiple Patient reports possible seizure today.  Also feeling generalized weakness and muscle cramping Exam : Somnolent but arousable, follows commands and goes right back to sleep. Plan: CT head negative.  Labs pending at this time. Discussed plan with wife who is at bedside   Ripley Fraise, MD 05/17/17 [phone]

## 2017-05-17 NOTE — ED Notes (Signed)
Pt departed in NAD.  

## 2017-05-30 ENCOUNTER — Telehealth: Payer: Self-pay | Admitting: *Deleted

## 2017-05-30 NOTE — Telephone Encounter (Signed)
LMVM for pt that was f/u on the prolonged EEG order.  I will have [facility] to recall the set up.  Pt to call back if questions.  (did make mention of pt in the ED for sz like activity this month).

## 2017-05-30 NOTE — Telephone Encounter (Signed)
Spoke to [place] w/ [facility] and pt had appt 12/15/2016 and was no show for appt at his home.  They attempted to call and LMVM for him on 11/7, and 01/27/2017 but did not hear back.

## 2017-05-30 NOTE — Telephone Encounter (Signed)
His  seizure activity syncope was the reason for ordering prolonged EEG.  This needs to be done before he makes follow-up appointment here

## 2017-06-05 ENCOUNTER — Encounter (HOSPITAL_COMMUNITY): Payer: Self-pay | Admitting: Psychology

## 2017-06-05 ENCOUNTER — Telehealth (HOSPITAL_COMMUNITY): Payer: Self-pay | Admitting: Psychology

## 2017-06-05 NOTE — Telephone Encounter (Signed)
LMVM for [facility] to see if pt had reached out to them after I have left pt message.

## 2017-06-06 NOTE — Telephone Encounter (Signed)
Spoke to [place] and she will reach out to pt and see about finishing testing.  I relayed that I have LM for pt as well and he has not returned my call.

## 2017-06-07 ENCOUNTER — Ambulatory Visit (INDEPENDENT_AMBULATORY_CARE_PROVIDER_SITE_OTHER): Payer: 59 | Admitting: Psychiatry

## 2017-06-07 ENCOUNTER — Telehealth (HOSPITAL_COMMUNITY): Payer: Self-pay | Admitting: Psychiatry

## 2017-06-07 ENCOUNTER — Encounter (HOSPITAL_COMMUNITY): Payer: Self-pay | Admitting: Psychiatry

## 2017-06-07 VITALS — BP 130/78 | HR 92 | Ht 72.0 in | Wt 215.0 lb

## 2017-06-07 DIAGNOSIS — F17210 Nicotine dependence, cigarettes, uncomplicated: Secondary | ICD-10-CM | POA: Diagnosis not present

## 2017-06-07 DIAGNOSIS — F3181 Bipolar II disorder: Secondary | ICD-10-CM | POA: Diagnosis not present

## 2017-06-07 DIAGNOSIS — Z811 Family history of alcohol abuse and dependence: Secondary | ICD-10-CM

## 2017-06-07 DIAGNOSIS — F4312 Post-traumatic stress disorder, chronic: Secondary | ICD-10-CM | POA: Diagnosis not present

## 2017-06-07 MED ORDER — ALPRAZOLAM 0.5 MG PO TABS: 0.5000 mg | ORAL_TABLET | Freq: Three times a day (TID) | ORAL | 3 refills | Status: DC | PRN

## 2017-06-07 MED ORDER — ARIPIPRAZOLE 2 MG PO TABS: ORAL_TABLET | ORAL | 1 refills | Status: DC

## 2017-06-07 MED ORDER — ARIPIPRAZOLE 5 MG PO TABS: ORAL_TABLET | ORAL | 1 refills | Status: DC

## 2017-06-07 MED ORDER — MIRTAZAPINE 45 MG PO TABS: 45.0000 mg | ORAL_TABLET | Freq: Every day | ORAL | 1 refills | Status: DC

## 2017-06-07 MED ORDER — VENLAFAXINE HCL ER 150 MG PO CP24: 300.0000 mg | ORAL_CAPSULE | Freq: Every day | ORAL | 1 refills | Status: DC

## 2017-06-07 NOTE — Telephone Encounter (Signed)
06/07/17  8:04am Spoke with patient in reference to the ABN (Advance Beneficiary Notice) form explained that the form is his acknowledgement that he is willing to pay whatever Medicare Insurance doesn't pay (therapy visits).  Gave the form to the patient because he wants his wife to review before he sign the form.  Waiting of the patient for feedback....Mariana Kaufman   Front Desk:  When registering the patient the Medicare coverage will need to be removed.  This will trigger the system to bill the patient and not Medicare for the balance.  This is only for therapy visits. The doctor visits you will leave in the insurance as normal both.   Sunday Spillers

## 2017-06-07 NOTE — Progress Notes (Signed)
BH MD/PA/NP OP Progress Note  06/07/2017 8:38 AM Vincent [name]  MRN:  [id number]  Chief Complaint: med management  HPI: Vincent [name] reports business things are going pretty well with him and his wife at home.  He reports that the Abilify has been very helpful.  He has been able to lose some weight, with healthier eating, some more activity.  This has been gradual, no night sweats or B symptoms.  He reports that the Abilify has been tolerated and he wonders about increasing more.  We discussed an increase to 7 mg and potential side effects that may occur.  We will continue Effexor, [name], and as needed Xanax as prescribed. Spent some time discussing some of the stressors between him and his son, particularly given that his son has Recently gotten someone pregnant and had to get married.  This has been a significant shift for his son and he is trying to help his son find his way in terms of finding consistent work and taking responsibility.  Visit Diagnosis:    ICD-10-CM   1. Bipolar 2 disorder (HCC) F31.81   2. Chronic post-traumatic stress disorder (PTSD) F43.12 mirtazapine ([name]) 45 MG tablet    venlafaxine XR (EFFEXOR-XR) 150 MG 24 hr capsule    ALPRAZolam (XANAX) 0.5 MG tablet    ARIPiprazole (ABILIFY) 5 MG tablet    ARIPiprazole (ABILIFY) 2 MG tablet    Past Psychiatric History: See intake H&P for full details. Reviewed, with no updates at this time.   Past Medical History:  Past Medical History:  Diagnosis Date  . Anxiety   . Arthritis   . Bipolar 1 disorder ([place])   . Chronic pain syndrome    back  . COPD (chronic obstructive pulmonary disease) ([place])   . Gastroparesis   . GERD (gastroesophageal reflux disease)   . Hiatal hernia   . History of bladder cancer urologist-  dr Consuella Lose   papillay TCC (Ta G1)  s/p TURBT and chemo instillation 2014  . History of encephalopathy 05/27/2015   admission w/ acute encephalopathy thought to be secondary to pain meds and COPD  .  History of gastric ulcer   . History of kidney stones   . History of TIA (transient ischemic attack) 2008    no residual  . History of traumatic head injury 2010   w/ LOC  per pt needed stitches  . Hyperlipidemia   . Hypothyroidism   . Insomnia    per sleep study 04-19-2015 without sleep apnea  . Neuropathy in diabetes ([place])    LOWER EXTREMITIES  . Poor historian   . Seizures, transient [facility]) neurologist-  dr Krista Blue--  differential dx complex partial seizure .vs.  mood disorder .vs.  pseudoseizure--  negative EEG's   confusion episodes and staring spells since 11/ 2015  . Transient confusion NEUOROLOGIST-  DR YAN   Episodes since 11/ 2015--  neurologist dx  differential complex partial seizure  .vs. mood disorder . vs. pseudoseizure  . Type 2 diabetes mellitus treated with insulin ([place])   . Urinary hesitancy     Past Surgical History:  Procedure Laterality Date  . CARDIAC CATHETERIZATION  12-27-2001  DR Einar Gip  &  05-26-2009  DR Irish Lack   RESULTS FOR BOTH ARE NORMAL CORONARIES AND PERSERVED LVF/ EF 60%  . CARPAL TUNNEL RELEASE Right 09-16-2003  . CARPAL TUNNEL RELEASE Left 02/25/2015   Procedure: LEFT CARPAL TUNNEL RELEASE;  Surgeon: Leanora Cover, MD;  Location: [place];  Service:  Orthopedics;  Laterality: Left;  . CYSTOSCOPY N/A 10/10/2012   Procedure: CYSTOSCOPY CLOT EVACUATION FULGERATION OF BLEEDERS ;  Surgeon: Claybon Jabs, MD;  Location[facility];  Service: Urology;  Laterality: N/A;  . CYSTOSCOPY WITH BIOPSY N/A 11/26/2015   Procedure: CYSTOSCOPY WITH BIOPSY AND FULGURATION;  Surgeon: Kathie Rhodes, MD;  Location: [place];  Service: Urology;  Laterality: N/A;  . LAPAROSCOPIC CHOLECYSTECTOMY  11-17-2010  . NEGATIVE SLEEP STUDY  04-19-2015  in epic  . ORCHIECTOMY Right 02/21/2016   Procedure: SCROTAL ORCHIECTOMY with TESTICULAR PROSTHESIS IMPLANT;  Surgeon: Kathie Rhodes, MD;  Location: [facility];  Service:  Urology;  Laterality: Right;  . ROTATOR CUFF REPAIR Right 12/2004  . TRANSURETHRAL RESECTION OF BLADDER TUMOR N/A 08/09/2012   Procedure: TRANSURETHRAL RESECTION OF BLADDER TUMOR (TURBT) WITH GYRUS WITH MITOMYCIN C;  Surgeon: Claybon Jabs, MD;  Location: [facility];  Service: Urology;  Laterality: N/A;  . TRANSURETHRAL RESECTION OF BLADDER TUMOR WITH GYRUS (TURBT-GYRUS) N/A 02/27/2014   Procedure: TRANSURETHRAL RESECTION OF BLADDER TUMOR WITH GYRUS (TURBT-GYRUS);  Surgeon: Claybon Jabs, MD;  Location: [facility];  Service: Urology;  Laterality: N/A;    Family Psychiatric History: See intake H&P for full details. Reviewed, with no updates at this time.   Family History:  Family History  Problem Relation Age of Onset  . Diabetes Mother   . Heart attack Mother   . Diabetes Father   . Hypertension Father   . Heart attack Father   . Alcohol abuse Father     Social History:  Social History   Socioeconomic History  . Marital status: Married    Spouse name: Not on file  . Number of children: 3  . Years of education: GED  . Highest education level: Not on file  Occupational History  . Occupation: Engineer, technical sales    Comment: Owner of company  Social Needs  . Financial resource strain: Not on file  . Food insecurity:    Worry: Not on file    Inability: Not on file  . Transportation needs:    Medical: Not on file    Non-medical: Not on file  Tobacco Use  . Smoking status: Current Every Day Smoker    Packs/day: 1.00    Years: 25.00    Pack years: 25.00    Types: Cigarettes  . Smokeless tobacco: Never Used  . Tobacco comment: Has cut back to 1 pack a day  Substance and Sexual Activity  . Alcohol use: No  . Drug use: No  . Sexual activity: Yes    Partners: Female    Birth control/protection: None  Lifestyle  . Physical activity:    Days per week: Not on file    Minutes per session: Not on file  . Stress: Not on file   Relationships  . Social connections:    Talks on phone: Not on file    Gets together: Not on file    Attends religious service: Not on file    Active member of club or organization: Not on file    Attends meetings of clubs or organizations: Not on file    Relationship status: Not on file  Other Topics Concern  . Not on file  Social History Narrative   Lives at home with his wife and children.   Left-handed.   3-4 cups caffeine per day.    Allergies:  Allergies  Allergen Reactions  . Celebrex [  Celecoxib] Anaphylaxis and Rash  . Hydrocodone Rash and Other (See Comments)    "blisters developed on arms"  . Sulfa Antibiotics Rash    Metabolic Disorder Labs: Lab Results  Component Value Date   HGBA1C 9.1 (H) 05/06/2016   MPG 214 05/06/2016   MPG 166 (H) 03/06/2014   No results found for: PROLACTIN Lab Results  Component Value Date   CHOL 277 (H) 05/06/2016   TRIG 431 (H) 05/06/2016   HDL 34 (L) 05/06/2016   CHOLHDL 8.1 05/06/2016   VLDL UNABLE TO CALCULATE IF TRIGLYCERIDE OVER 400 mg/dL 05/06/2016   LDLCALC UNABLE TO CALCULATE IF TRIGLYCERIDE OVER 400 mg/dL 05/06/2016   LDLCALC  05/27/2009    UNABLE TO CALCULATE IF TRIGLYCERIDE OVER 400 mg/dL        Total Cholesterol/HDL:CHD Risk Coronary Heart Disease Risk Table                     Men   Women  1/2 Average Risk   3.4   3.3  Average Risk       5.0   4.4  2 X Average Risk   9.6   7.1  3 X Average Risk  23.4   11.0        Use the calculated Patient Ratio above and the CHD Risk Table to determine the patient's CHD Risk.        ATP III CLASSIFICATION (LDL):  <100     mg/dL   Optimal  100-129  mg/dL   Near or Above                    Optimal  130-159  mg/dL   Borderline  160-189  mg/dL   High  >190     mg/dL   Very High   Lab Results  Component Value Date   TSH 3.735 05/06/2016   TSH 3.225 03/05/2014    Therapeutic Level Labs: Lab Results  Component Value Date   LITHIUM 0.35 (L) 05/28/2015   Lab  Results  Component Value Date   VALPROATE 17 (L) 05/06/2016   No components found for:  CBMZ  Current Medications: Current Outpatient Medications  Medication Sig Dispense Refill  . ALPRAZolam (XANAX) 0.5 MG tablet Take 1 tablet (0.5 mg total) by mouth 3 (three) times daily as needed for anxiety (anger, panic). 90 tablet 3  . ARIPiprazole (ABILIFY) 5 MG tablet Take with 2 mg tablet for 7 mg total 90 tablet 1  . atorvastatin (LIPITOR) 40 MG tablet Take 1 tablet (40 mg total) by mouth daily at 6 PM. 30 tablet 1  . Cholecalciferol (VITAMIN D) 2000 units tablet Take 1 tablet (2,000 Units total) by mouth daily. 90 tablet 1  . fentaNYL (DURAGESIC - DOSED MCG/HR) 75 MCG/HR Place 100 mcg onto the skin every 3 (three) days.     Marland Kitchen gabapentin (NEURONTIN) 600 MG tablet Take 2 tablets (1,200 mg total) by mouth 2 (two) times daily. 120 tablet 3  . hydroxychloroquine (PLAQUENIL) 200 MG tablet Take 200 mg by mouth 2 (two) times daily.     . insulin lispro protamine-lispro (HUMALOG 75/25 MIX) (75-25) 100 UNIT/ML SUSP injection Inject 60-160 Units into the skin See admin instructions. Use 60 units in the morning and use 160 units at night    . lamoTRIgine (LAMICTAL) 100 MG tablet Take 2 tablets (200 mg total) by mouth 2 (two) times daily. 360 tablet 4  . levothyroxine (SYNTHROID, LEVOTHROID) 25 MCG tablet Take  25 mcg by mouth daily before breakfast.     . Methotrexate, PF, (RASUVO) 25 MG/0.5ML SOAJ Inject 25 mg into the skin every 7 (seven) days.    . mirtazapine ([name]) 45 MG tablet Take 1 tablet (45 mg total) by mouth at bedtime. 90 tablet 1  . omeprazole (PRILOSEC) 20 MG capsule Take 20 mg by mouth every morning.     . pregabalin (LYRICA) 100 MG capsule Take 100-200 mg by mouth See admin instructions. Take 1 capsule every morning and take 2 capsules at night    . venlafaxine XR (EFFEXOR-XR) 150 MG 24 hr capsule Take 2 capsules (300 mg total) by mouth daily with breakfast. 180 capsule 1  . ARIPiprazole  (ABILIFY) 2 MG tablet Take with 5 mg for a total of 7 mg daily 90 tablet 1   No current facility-administered medications for this visit.      Musculoskeletal: Strength & Muscle Tone: within normal limits Gait & Station: normal Patient leans: N/A  Psychiatric Specialty Exam: ROS  Blood pressure 130/78, pulse 92, height 6' (1.829 m), weight 215 lb (97.5 kg).Body mass index is 29.16 kg/m.  General Appearance: Casual and Well Groomed  Eye Contact:  Good  Speech:  Clear and Coherent and Normal Rate  Volume:  Normal  Mood:  Euthymic  Affect:  Appropriate and Congruent  Thought Process:  Goal Directed and Descriptions of Associations: Intact  Orientation:  Full (Time, Place, and Person)  Thought Content: Logical   Suicidal Thoughts:  No  Homicidal Thoughts:  No  Memory:  Immediate;   Good  Judgement:  Good  Insight:  Good  Psychomotor Activity:  Normal  Concentration:  Concentration: Good  Recall:  Good  Fund of Knowledge: Good  Language: Good  Akathisia:  Negative  Handed:  Right  AIMS (if indicated): not done  Assets:  Communication Skills Desire for Improvement Financial Resources/Insurance Housing Intimacy Resilience Social Support Transportation  ADL's:  Intact  Cognition: WNL  Sleep:  Good   Screenings: AUDIT     Admission (Discharged) from 05/05/2016 in [place]  Alcohol Use Disorder Identification Test Final Score (AUDIT)  1    Mini-Mental     Office Visit from 11/04/2014 in [facility]  Total Score (max 30 points )  26    PHQ2-9     Office Visit from 03/24/2015 in [facility]  PHQ-2 Total Score  2  PHQ-9 Total Score  12      Assessment and Plan: Vincent [name] presents with generally good control of his mood symptoms, some irritability and breakthrough anger and frustration.  He has had some external stressors which have impacted his mood and sleep quality to some degree.  We discussed  increasing Abilify to more robust maintenance dose of 7 mg daily.  I would like to avoid increasing the medication beyond 10 mg due to the significant metabolic effects.  He has no acute safety issues or substance use, works part-time at [employer], has a improving relationship with his wife, and participates consistently in individual therapy.  We will follow-up in 4 months or sooner if needed.  Notably he had a recent seizure breakthrough episode and was seen in the ER.  No seizures since then.  He continues on Lamictal 200 mg twice a day for seizure control.  1. Bipolar 2 disorder ([place])   2. Chronic post-traumatic stress disorder (PTSD)     Status of current problems: stable  Labs Ordered: No orders  of the defined types were placed in this encounter.   Labs Reviewed: n/a  Collateral Obtained/Records Reviewed: ER notes  Plan:  Continue effexor, remeron, alprazolam as prescribed Abilify increased to 7 mg daily Continue individual therapy  I spent 20 minutes with the patient in direct face-to-face clinical care.  Greater than 50% of this time was spent in counseling and coordination of care with the patient.    Aundra Dubin, MD 06/07/2017, 8:38 AM

## 2017-06-11 ENCOUNTER — Ambulatory Visit (INDEPENDENT_AMBULATORY_CARE_PROVIDER_SITE_OTHER): Payer: Medicare Other | Admitting: Psychology

## 2017-06-11 DIAGNOSIS — F4312 Post-traumatic stress disorder, chronic: Secondary | ICD-10-CM

## 2017-06-11 DIAGNOSIS — F3181 Bipolar II disorder: Secondary | ICD-10-CM

## 2017-06-11 NOTE — Progress Notes (Signed)
   THERAPIST PROGRESS NOTE  Session Time: 9.03am-9.32am  Participation Level: Active  Behavioral Response: Well GroomedAlertaffect wnl  Type of Therapy: Individual Therapy  Treatment Goals addressed: Diagnosis: PtSD, Depression and goal 1.  Interventions: CBT and Supportive  Summary: Vincent [name] is a 52 y.o. male who presents with affect wnl.  Pt reported that his sleep has improved w/ medication change.  Pt reported that his mood has been stable.  Pt reported that his major stressor has been w/ his son- lost 2nd job in 6 months as not getting up to go to work. Pt reported that son's wife has lost 2 jobs in 3 months.  Pt reported that this is causing tension w/ he and wife as wife wants to support financially w/ their bills and he doesn't want to- feels that "tough love" is needed.  Pt reported that he has been able to talk through this and not overreact.  Pt discussed interactions w/ daughter- reports that while still disagree handling well.     Suicidal/Homicidal: Nowithout intent/plan  Therapist Response: Assessed pt current functioning per pt report. Processed w/ pt coping w/ stressors and using effective communication to work through conflict.   Plan: Return again in 2 weeks.  Diagnosis: PtSD, Bipolar 2   Emelyn Roen, LPC 06/11/2017

## 2017-06-19 ENCOUNTER — Telehealth (HOSPITAL_COMMUNITY): Payer: Self-pay | Admitting: Psychology

## 2017-06-19 NOTE — Telephone Encounter (Signed)
06/19/17 11:32am On the last visit the patient forgot to bring the ABN form in order to have Medicare bills sent to him - he was reminded to bring the form on next visit if this patient doesn't bring the form - DO NOT SCHEDULE ANYMORE VISTS WITH LEANNE./sh

## 2017-07-02 DIAGNOSIS — M533 Sacrococcygeal disorders, not elsewhere classified: Secondary | ICD-10-CM | POA: Diagnosis not present

## 2017-07-02 DIAGNOSIS — Z79899 Other long term (current) drug therapy: Secondary | ICD-10-CM | POA: Diagnosis not present

## 2017-07-02 DIAGNOSIS — M47816 Spondylosis without myelopathy or radiculopathy, lumbar region: Secondary | ICD-10-CM | POA: Diagnosis not present

## 2017-07-02 DIAGNOSIS — Z5181 Encounter for therapeutic drug level monitoring: Secondary | ICD-10-CM | POA: Diagnosis not present

## 2017-07-02 DIAGNOSIS — M5136 Other intervertebral disc degeneration, lumbar region: Secondary | ICD-10-CM | POA: Diagnosis not present

## 2017-07-02 DIAGNOSIS — E0849 Diabetes mellitus due to underlying condition with other diabetic neurological complication: Secondary | ICD-10-CM | POA: Diagnosis not present

## 2017-07-03 ENCOUNTER — Other Ambulatory Visit: Payer: Self-pay

## 2017-07-03 ENCOUNTER — Ambulatory Visit (HOSPITAL_COMMUNITY)
Admission: EM | Admit: 2017-07-03 | Discharge: 2017-07-03 | Disposition: A | Discharge status: Home / Self Care | Payer: 59 | Attending: Family Medicine | Admitting: Family Medicine

## 2017-07-03 ENCOUNTER — Encounter (HOSPITAL_COMMUNITY): Payer: Self-pay | Admitting: Emergency Medicine

## 2017-07-03 DIAGNOSIS — K529 Noninfective gastroenteritis and colitis, unspecified: Secondary | ICD-10-CM | POA: Diagnosis not present

## 2017-07-03 MED ORDER — ONDANSETRON 4 MG PO TBDP
4.0000 mg | ORAL_TABLET | Freq: Once | ORAL | Status: AC
  Administered 2017-07-03: 4 mg via ORAL

## 2017-07-03 MED ORDER — ONDANSETRON 4 MG PO TBDP: 4.0000 mg | ORAL_TABLET | Freq: Three times a day (TID) | ORAL | 0 refills | Status: DC | PRN

## 2017-07-03 MED ORDER — ONDANSETRON 4 MG PO TBDP
ORAL_TABLET | ORAL | Status: AC
  Filled 2017-07-03: qty 1

## 2017-07-03 NOTE — ED Triage Notes (Signed)
Patient reports for 2 days now he can drink liquids-only.  Any thought or attempt at eating makes him vomit.  Patient hurts all over

## 2017-07-03 NOTE — ED Provider Notes (Signed)
[place]   [id number] 07/03/17 Arrival Time: 2725  ASSESSMENT & PLAN:  1. Gastroenteritis     Meds ordered this encounter  Medications  . ondansetron (ZOFRAN-ODT) disintegrating tablet 4 mg  . ondansetron (ZOFRAN-ODT) 4 MG disintegrating tablet    Sig: Take 1 tablet (4 mg total) by mouth every 8 (eight) hours as needed for nausea or vomiting.    Dispense:  15 tablet    Refill:  0   Discussed typical duration of symptoms for suspected viral GI illness. Will do his best to ensure adequate fluid intake in order to avoid dehydration. Will proceed to the Emergency Department for evaluation if unable to tolerate PO fluids regularly.  Otherwise he will f/u with his PCP or here if not showing improvement over the next 48-72 hours.  Reviewed expectations re: course of current medical issues. Questions answered. Outlined signs and symptoms indicating need for more acute intervention. Patient verbalized understanding. After Visit Summary given.   SUBJECTIVE: History from: patient.  Vincent [name] is a 52 y.o. male who presents with complaint of non-bloody intermittent nausea and vomiting of brown material with "loose stools". Onset abrupt, 2 days ago. Abdominal discomfort: moderate and cramping; started after persistent emesis. Symptoms are unchanged since beginning. Aggravating factors: eating. Alleviating factors: none. Associated symptoms: fatigue. He denies fever. Appetite: decreased. PO intake: decreased. Is tolerating PO fluids. Ambulatory without assistance. Urinary symptoms: none. Last bowel movement today without blood. OTC treatment: none.   Past Surgical History:  Procedure Laterality Date  . CARDIAC CATHETERIZATION  12-27-2001  DR Einar Gip  &  05-26-2009  DR Irish Lack   RESULTS FOR BOTH ARE NORMAL CORONARIES AND PERSERVED LVF/ EF 60%  . CARPAL TUNNEL RELEASE Right 09-16-2003  . CARPAL TUNNEL RELEASE Left 02/25/2015   Procedure: LEFT CARPAL TUNNEL RELEASE;   Surgeon: Leanora Cover, MD;  Location: [place];  Service: Orthopedics;  Laterality: Left;  . CYSTOSCOPY N/A 10/10/2012   Procedure: CYSTOSCOPY CLOT EVACUATION FULGERATION OF BLEEDERS ;  Surgeon: Claybon Jabs, MD;  Location: [facility];  Service: Urology;  Laterality: N/A;  . CYSTOSCOPY WITH BIOPSY N/A 11/26/2015   Procedure: CYSTOSCOPY WITH BIOPSY AND FULGURATION;  Surgeon: Kathie Rhodes, MD;  Location: [place];  Service: Urology;  Laterality: N/A;  . LAPAROSCOPIC CHOLECYSTECTOMY  11-17-2010  . NEGATIVE SLEEP STUDY  04-19-2015  in epic  . ORCHIECTOMY Right 02/21/2016   Procedure: SCROTAL ORCHIECTOMY with TESTICULAR PROSTHESIS IMPLANT;  Surgeon: Kathie Rhodes, MD;  Location: [facility];  Service: Urology;  Laterality: Right;  . ROTATOR CUFF REPAIR Right 12/2004  . TRANSURETHRAL RESECTION OF BLADDER TUMOR N/A 08/09/2012   Procedure: TRANSURETHRAL RESECTION OF BLADDER TUMOR (TURBT) WITH GYRUS WITH MITOMYCIN C;  Surgeon: Claybon Jabs, MD;  Location: [facility];  Service: Urology;  Laterality: N/A;  . TRANSURETHRAL RESECTION OF BLADDER TUMOR WITH GYRUS (TURBT-GYRUS) N/A 02/27/2014   Procedure: TRANSURETHRAL RESECTION OF BLADDER TUMOR WITH GYRUS (TURBT-GYRUS);  Surgeon: Claybon Jabs, MD;  Location: [facility];  Service: Urology;  Laterality: N/A;    ROS: As per HPI.  OBJECTIVE:  Vitals:   07/03/17 1440  BP: 119/72  Pulse: (!) 102  Resp: 18  Temp: 99.3 F (37.4 C)  TempSrc: Oral  SpO2: 97%    Slight tachycardia noted.  General appearance: alert; no distress Oropharynx: moist Abdomen: soft; non-distended; no significant abdominal tenderness, "cramping feeling"; bowel sounds present; no guarding or rebound tenderness Back: no  CVA tenderness Extremities: no edema; symmetrical with no gross deformities Skin: warm and dry Neurologic: normal gait Psychological: alert and cooperative; normal  mood and affect    Allergies  Allergen Reactions  . Celebrex [Celecoxib] Anaphylaxis and Rash  . Hydrocodone Rash and Other (See Comments)    "blisters developed on arms"  . Sulfa Antibiotics Rash                                               Past Medical History:  Diagnosis Date  . Anxiety   . Arthritis   . Bipolar 1 disorder ([place])   . Chronic pain syndrome    back  . COPD (chronic obstructive pulmonary disease) ([place])   . Gastroparesis   . GERD (gastroesophageal reflux disease)   . Hiatal hernia   . History of bladder cancer urologist-  dr Consuella Lose   papillay TCC (Ta G1)  s/p TURBT and chemo instillation 2014  . History of encephalopathy 05/27/2015   admission w/ acute encephalopathy thought to be secondary to pain meds and COPD  . History of gastric ulcer   . History of kidney stones   . History of TIA (transient ischemic attack) 2008    no residual  . History of traumatic head injury 2010   w/ LOC  per pt needed stitches  . Hyperlipidemia   . Hypothyroidism   . Insomnia    per sleep study 04-19-2015 without sleep apnea  . Neuropathy in diabetes ([place])    LOWER EXTREMITIES  . Poor historian   . Seizures, transient [facility]) neurologist-  dr Krista Blue--  differential dx complex partial seizure .vs.  mood disorder .vs.  pseudoseizure--  negative EEG's   confusion episodes and staring spells since 11/ 2015  . Transient confusion NEUOROLOGIST-  DR YAN   Episodes since 11/ 2015--  neurologist dx  differential complex partial seizure  .vs. mood disorder . vs. pseudoseizure  . Type 2 diabetes mellitus treated with insulin ([place])   . Urinary hesitancy    Social History   Socioeconomic History  . Marital status: Married    Spouse name: Not on file  . Number of children: 3  . Years of education: GED  . Highest education level: Not on file  Occupational History  . Occupation: Engineer, technical sales    Comment: Owner of company  Social Needs  . Financial resource strain: Not  on file  . Food insecurity:    Worry: Not on file    Inability: Not on file  . Transportation needs:    Medical: Not on file    Non-medical: Not on file  Tobacco Use  . Smoking status: Current Every Day Smoker    Packs/day: 1.00    Years: 25.00    Pack years: 25.00    Types: Cigarettes  . Smokeless tobacco: Never Used  . Tobacco comment: Has cut back to 1 pack a day  Substance and Sexual Activity  . Alcohol use: No  . Drug use: No  . Sexual activity: Yes    Partners: Female    Birth control/protection: None  Lifestyle  . Physical activity:    Days per week: Not on file    Minutes per session: Not on file  . Stress: Not on file  Relationships  . Social connections:    Talks on phone: Not on file  Gets together: Not on file    Attends religious service: Not on file    Active member of club or organization: Not on file    Attends meetings of clubs or organizations: Not on file    Relationship status: Not on file  . Intimate partner violence:    Fear of current or ex partner: Not on file    Emotionally abused: Not on file    Physically abused: Not on file    Forced sexual activity: Not on file  Other Topics Concern  . Not on file  Social History Narrative   Lives at home with his wife and children.   Left-handed.   3-4 cups caffeine per day.   Family History  Problem Relation Age of Onset  . Diabetes Mother   . Heart attack Mother   . Diabetes Father   . Hypertension Father   . Heart attack Father   . Alcohol abuse Father      Vanessa Kick, MD 07/04/17 [phone]

## 2017-07-03 NOTE — Discharge Instructions (Signed)

## 2017-07-06 DIAGNOSIS — R531 Weakness: Secondary | ICD-10-CM | POA: Diagnosis not present

## 2017-07-06 DIAGNOSIS — F329 Major depressive disorder, single episode, unspecified: Secondary | ICD-10-CM | POA: Diagnosis not present

## 2017-07-06 DIAGNOSIS — E119 Type 2 diabetes mellitus without complications: Secondary | ICD-10-CM | POA: Diagnosis not present

## 2017-07-06 DIAGNOSIS — I10 Essential (primary) hypertension: Secondary | ICD-10-CM | POA: Diagnosis not present

## 2017-07-06 DIAGNOSIS — M2550 Pain in unspecified joint: Secondary | ICD-10-CM | POA: Diagnosis not present

## 2017-07-06 DIAGNOSIS — G40909 Epilepsy, unspecified, not intractable, without status epilepticus: Secondary | ICD-10-CM | POA: Diagnosis not present

## 2017-07-11 ENCOUNTER — Ambulatory Visit (HOSPITAL_COMMUNITY): Payer: Self-pay | Admitting: Psychology

## 2017-07-11 ENCOUNTER — Other Ambulatory Visit: Payer: Self-pay | Admitting: Neurology

## 2017-07-11 DIAGNOSIS — E781 Pure hyperglyceridemia: Secondary | ICD-10-CM | POA: Diagnosis not present

## 2017-07-11 DIAGNOSIS — K3184 Gastroparesis: Secondary | ICD-10-CM | POA: Diagnosis not present

## 2017-07-11 DIAGNOSIS — R739 Hyperglycemia, unspecified: Secondary | ICD-10-CM | POA: Diagnosis not present

## 2017-07-11 DIAGNOSIS — E1142 Type 2 diabetes mellitus with diabetic polyneuropathy: Secondary | ICD-10-CM | POA: Diagnosis not present

## 2017-07-17 ENCOUNTER — Ambulatory Visit (HOSPITAL_COMMUNITY): Payer: Self-pay | Admitting: Psychology

## 2017-07-17 DIAGNOSIS — E039 Hypothyroidism, unspecified: Secondary | ICD-10-CM | POA: Diagnosis not present

## 2017-07-17 DIAGNOSIS — F39 Unspecified mood [affective] disorder: Secondary | ICD-10-CM | POA: Diagnosis not present

## 2017-07-17 DIAGNOSIS — E1142 Type 2 diabetes mellitus with diabetic polyneuropathy: Secondary | ICD-10-CM | POA: Diagnosis not present

## 2017-07-17 DIAGNOSIS — K3184 Gastroparesis: Secondary | ICD-10-CM | POA: Diagnosis not present

## 2017-07-19 DIAGNOSIS — Z8551 Personal history of malignant neoplasm of bladder: Secondary | ICD-10-CM | POA: Diagnosis not present

## 2017-07-19 DIAGNOSIS — R300 Dysuria: Secondary | ICD-10-CM | POA: Diagnosis not present

## 2017-08-15 ENCOUNTER — Ambulatory Visit (HOSPITAL_COMMUNITY): Payer: Self-pay | Admitting: Psychology

## 2017-08-15 ENCOUNTER — Encounter

## 2017-08-22 ENCOUNTER — Encounter: Payer: Self-pay | Admitting: Family Medicine

## 2017-08-25 ENCOUNTER — Emergency Department (HOSPITAL_COMMUNITY): Payer: 59

## 2017-08-25 ENCOUNTER — Other Ambulatory Visit: Payer: Self-pay

## 2017-08-25 ENCOUNTER — Emergency Department (HOSPITAL_COMMUNITY)
Admission: EM | Admit: 2017-08-25 | Discharge: 2017-08-25 | Disposition: A | Discharge status: Home / Self Care | Payer: 59 | Attending: Emergency Medicine | Admitting: Emergency Medicine

## 2017-08-25 ENCOUNTER — Encounter (HOSPITAL_COMMUNITY): Payer: Self-pay | Admitting: Emergency Medicine

## 2017-08-25 DIAGNOSIS — J449 Chronic obstructive pulmonary disease, unspecified: Secondary | ICD-10-CM | POA: Diagnosis not present

## 2017-08-25 DIAGNOSIS — R739 Hyperglycemia, unspecified: Secondary | ICD-10-CM

## 2017-08-25 DIAGNOSIS — Z87820 Personal history of traumatic brain injury: Secondary | ICD-10-CM | POA: Diagnosis not present

## 2017-08-25 DIAGNOSIS — F17210 Nicotine dependence, cigarettes, uncomplicated: Secondary | ICD-10-CM | POA: Diagnosis not present

## 2017-08-25 DIAGNOSIS — R079 Chest pain, unspecified: Secondary | ICD-10-CM | POA: Diagnosis not present

## 2017-08-25 DIAGNOSIS — R252 Cramp and spasm: Secondary | ICD-10-CM | POA: Insufficient documentation

## 2017-08-25 DIAGNOSIS — Z794 Long term (current) use of insulin: Secondary | ICD-10-CM | POA: Insufficient documentation

## 2017-08-25 DIAGNOSIS — E1165 Type 2 diabetes mellitus with hyperglycemia: Secondary | ICD-10-CM | POA: Insufficient documentation

## 2017-08-25 DIAGNOSIS — Z79899 Other long term (current) drug therapy: Secondary | ICD-10-CM | POA: Insufficient documentation

## 2017-08-25 DIAGNOSIS — E039 Hypothyroidism, unspecified: Secondary | ICD-10-CM | POA: Insufficient documentation

## 2017-08-25 LAB — I-STAT TROPONIN, ED
Troponin i, poc: 0 ng/mL (ref 0.00–0.08)
Troponin i, poc: 0 ng/mL (ref 0.00–0.08)

## 2017-08-25 LAB — CBC
HCT: 43 % (ref 39.0–52.0)
Hemoglobin: 14.8 g/dL (ref 13.0–17.0)
MCH: 28.5 pg (ref 26.0–34.0)
MCHC: 34.4 g/dL (ref 30.0–36.0)
MCV: 82.9 fL (ref 78.0–100.0)
Platelets: 233 10*3/uL (ref 150–400)
RBC: 5.19 MIL/uL (ref 4.22–5.81)
RDW: 12.9 % (ref 11.5–15.5)
WBC: 6.3 10*3/uL (ref 4.0–10.5)

## 2017-08-25 LAB — BASIC METABOLIC PANEL
Anion gap: 14 (ref 5–15)
BUN: 15 mg/dL (ref 6–20)
CO2: 23 mmol/L (ref 22–32)
Calcium: 9.4 mg/dL (ref 8.9–10.3)
Chloride: 95 mmol/L — ABNORMAL LOW (ref 101–111)
Creatinine, Ser: 1.17 mg/dL (ref 0.61–1.24)
GFR calc Af Amer: >60 mL/min (ref >60)
GFR calc non Af Amer: >60 mL/min (ref >60)
Glucose, Bld: 602 mg/dL (ref 65–99)
Potassium: 4 mmol/L (ref 3.5–5.1)
Sodium: 132 mmol/L — ABNORMAL LOW (ref 135–145)

## 2017-08-25 LAB — CBG MONITORING, ED
Glucose-Capillary: 301 mg/dL — ABNORMAL HIGH (ref 65–99)
Glucose-Capillary: 232 mg/dL — ABNORMAL HIGH (ref 65–99)

## 2017-08-25 MED ORDER — DIAZEPAM 5 MG/ML IJ SOLN
5.0000 mg | Freq: Once | INTRAMUSCULAR | Status: AC
  Administered 2017-08-25: 5 mg via INTRAVENOUS
  Filled 2017-08-25: qty 2

## 2017-08-25 MED ORDER — SODIUM CHLORIDE 0.9 % IV SOLN
INTRAVENOUS | Status: DC
  Administered 2017-08-25: 5.4 [IU]/h via INTRAVENOUS
  Filled 2017-08-25: qty 1

## 2017-08-25 MED ORDER — DEXTROSE-NACL 5-0.45 % IV SOLN: INTRAVENOUS | Status: DC

## 2017-08-25 MED ORDER — SODIUM CHLORIDE 0.9 % IV BOLUS
2000.0000 mL | Freq: Once | INTRAVENOUS | Status: AC
Start: 2017-08-25 — End: 2017-08-25
  Administered 2017-08-25: 2000 mL via INTRAVENOUS

## 2017-08-25 NOTE — ED Triage Notes (Signed)
Pt states he was at work this afternoon when he had sudden onset of non-radiating chest pain, shortness of breath and diaphoresis lasting approx. 3-5 minutes. Denies chest pain at this time. States on his way home, his hands started "drawing up" 1 hr. PTA. Hx TIA, no neuro deficits noted at this time.

## 2017-08-25 NOTE — ED Provider Notes (Signed)
[facility] EMERGENCY DEPARTMENT Provider Note   CSN: [id number] Arrival date & time: 08/25/17  1755     History   Chief Complaint Chief Complaint  Patient presents with  . Chest Pain    HPI Vincent [name] is a 52 y.o. male.  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 The pain was associated with SOB. It did not radiate and has not recurred since it stopped after about 5 minutes. No lightheadedness. He reports sweating but also that he usually sweats in the kitchen because he works at [employer] . No nausea or vomiting. He denies any further chest pain but states that his hands started to cramp and draw up on the way here and this recurs if he tries to use his hands at all.  No history of heart disease. Wife reports having catheterizations in the past that have been negative, with the last one being about 10 years ago.   The history is provided by the patient and the spouse. No language interpreter was used.  Chest Pain   Associated symptoms include shortness of breath. Pertinent negatives include no abdominal pain, no fever and no nausea.    Past Medical History:  Diagnosis Date  . Anxiety   . Arthritis   . Bipolar 1 disorder ([place])   . Chronic pain syndrome    back  . COPD (chronic obstructive pulmonary disease) ([place])   . Gastroparesis   . GERD (gastroesophageal reflux disease)   . Hiatal hernia   . History of bladder cancer urologist-  dr Consuella Lose   papillay TCC (Ta G1)  s/p TURBT and chemo instillation 2014  . History of encephalopathy 05/27/2015   admission w/ acute encephalopathy thought to be secondary to pain meds and COPD  . History of gastric ulcer   . History of kidney stones   . History of TIA (transient ischemic attack) 2008    no residual  . History of traumatic head injury 2010   w/ LOC  per pt needed stitches  . Hyperlipidemia   . Hypothyroidism   . Insomnia    per sleep study 04-19-2015 without sleep apnea  . Neuropathy in diabetes ([place])    LOWER EXTREMITIES  . Poor historian   . Seizures, transient [facility]) neurologist-  dr Krista Blue--  differential dx complex partial seizure .vs.  mood disorder .vs.  pseudoseizure--  negative EEG's   confusion episodes and staring spells since 11/ 2015  . Transient confusion NEUOROLOGIST-  DR YAN   Episodes since 11/ 2015--  neurologist dx  differential complex partial seizure  .vs. mood disorder . vs. pseudoseizure  . Type 2 diabetes mellitus treated with insulin ([place])   . Urinary hesitancy     Patient Active Problem List   Diagnosis Date Noted  . Seizure disorder ([place]) 12/19/2016  . Essential tremor 12/19/2016  . Polypharmacy 12/19/2016  . Chronic post-traumatic stress disorder (PTSD) 06/16/2016  . Tremor 06/16/2016  . Tobacco use disorder 05/05/2016  . Psychophysiological insomnia 06/02/2015  . Altered mental status 05/28/2015  . Acute bronchitis 05/28/2015  . Type 2 diabetes mellitus with hyperglycemia ([place]) 05/28/2015  . Hypothyroidism 05/28/2015  . History of rheumatoid arthritis 05/28/2015  . Chronic pain 05/28/2015  . Depression 03/18/2015  . Diabetes ([place]) 01/23/2015  . Obesity (BMI 30-39.9) 03/06/2014  . Acute encephalopathy 03/05/2014  . HLD (hyperlipidemia) 03/05/2014  . Anemia, normocytic normochromic 03/05/2014  . Altered mental  state   . Protein-calorie malnutrition, severe ([place]) 10/09/2012  . Bladder tumor 08/08/2012  . Biliary dyskinesia 11/02/2010    Past Surgical History:  Procedure Laterality Date  . CARDIAC CATHETERIZATION  12-27-2001  DR Einar Gip  &  05-26-2009  DR Irish Lack   RESULTS FOR BOTH ARE NORMAL CORONARIES AND PERSERVED LVF/ EF 60%  . CARPAL TUNNEL RELEASE Right 09-16-2003  . CARPAL TUNNEL RELEASE Left 02/25/2015   Procedure: LEFT CARPAL TUNNEL RELEASE;  Surgeon: Leanora Cover, MD;  Location: [facility];  Service: Orthopedics;  Laterality: Left;  . CYSTOSCOPY  N/A 10/10/2012   Procedure: CYSTOSCOPY CLOT EVACUATION FULGERATION OF BLEEDERS ;  Surgeon: [place] Jabs, MD;  Location[facility];  Service: Urology;  Laterality: N/A;  . CYSTOSCOPY WITH BIOPSY N/A 11/26/2015   Procedure: CYSTOSCOPY WITH BIOPSY AND FULGURATION;  Surgeon: Kathie Rhodes, MD;  Location: [facility];  Service: Urology;  Laterality: N/A;  . LAPAROSCOPIC CHOLECYSTECTOMY  11-17-2010  . NEGATIVE SLEEP STUDY  04-19-2015  in epic  . ORCHIECTOMY Right 02/21/2016   Procedure: SCROTAL ORCHIECTOMY with TESTICULAR PROSTHESIS IMPLANT;  Surgeon: Kathie Rhodes, MD;  Location: [facility];  Service: Urology;  Laterality: Right;  . ROTATOR CUFF REPAIR Right 12/2004  . TRANSURETHRAL RESECTION OF BLADDER TUMOR N/A 08/09/2012   Procedure: TRANSURETHRAL RESECTION OF BLADDER TUMOR (TURBT) WITH GYRUS WITH MITOMYCIN C;  Surgeon: [place] Jabs, MD;  Location[facility];  Service: Urology;  Laterality: N/A;  . TRANSURETHRAL RESECTION OF BLADDER TUMOR WITH GYRUS (TURBT-GYRUS) N/A 02/27/2014   Procedure: TRANSURETHRAL RESECTION OF BLADDER TUMOR WITH GYRUS (TURBT-GYRUS);  Surgeon: Claybon Jabs, MD;  Location: [facility];  Service: Urology;  Laterality: N/A;        Home Medications    Prior to Admission medications   Medication Sig Start Date End Date Taking? Authorizing Provider  ALPRAZolam Duanne Moron) 0.5 MG tablet Take 1 tablet (0.5 mg total) by mouth 3 (three) times daily as needed for anxiety (anger, panic). 06/07/17 06/07/18  Aundra Dubin, MD  ARIPiprazole (ABILIFY) 2 MG tablet Take with 5 mg for a total of 7 mg daily 06/07/17   Daron Offer, Richard Miu, MD  ARIPiprazole (ABILIFY) 5 MG tablet Take with 2 mg tablet for 7 mg total 06/07/17   Eksir, Richard Miu, MD  atorvastatin (LIPITOR) 40 MG tablet Take 1 tablet (40 mg total) by mouth daily at 6 PM. 05/06/16   Pucilowska, Jolanta B, MD  Cholecalciferol (VITAMIN D) 2000  units tablet Take 1 tablet (2,000 Units total) by mouth daily. 11/23/16   Eksir, Richard Miu, MD  fentaNYL (DURAGESIC - DOSED MCG/HR) 75 MCG/HR Place 100 mcg onto the skin every 3 (three) days.     [provider]  gabapentin (NEURONTIN) 600 MG tablet Take 2 tablets (1,200 mg total) by mouth 2 (two) times daily. 11/22/16   Eksir, Richard Miu, MD  hydroxychloroquine (PLAQUENIL) 200 MG tablet Take 200 mg by mouth 2 (two) times daily.     [provider]  insulin lispro protamine-lispro (HUMALOG 75/25 MIX) (75-25) 100 UNIT/ML SUSP injection Inject 60-160 Units into the skin See admin instructions. Use 60 units in the morning and use 160 units at night    [provider]  lamoTRIgine (LAMICTAL) 100 MG tablet Take 2 tablets (200 mg total) by mouth 2 (two) times daily. Must be seen prior to future refills.  Please call [phone] for an appt. 07/11/17   Dennie Bible,  NP  levothyroxine (SYNTHROID, LEVOTHROID) 25 MCG tablet Take 25 mcg by mouth daily before breakfast.     [provider]  Methotrexate, PF, (RASUVO) 25 MG/0.5ML SOAJ Inject 25 mg into the skin every 7 (seven) days.    [provider]  mirtazapine ([name]) 45 MG tablet Take 1 tablet (45 mg total) by mouth at bedtime. 06/07/17   Eksir, Richard Miu, MD  omeprazole (PRILOSEC) 20 MG capsule Take 20 mg by mouth every morning.     [provider]  ondansetron (ZOFRAN-ODT) 4 MG disintegrating tablet Take 1 tablet (4 mg total) by mouth every 8 (eight) hours as needed for nausea or vomiting. 07/03/17   Vanessa Kick, MD  pregabalin (LYRICA) 100 MG capsule Take 100-200 mg by mouth See admin instructions. Take 1 capsule every morning and take 2 capsules at night    [provider]  venlafaxine XR (EFFEXOR-XR) 150 MG 24 hr capsule Take 2 capsules (300 mg total) by mouth daily with breakfast. 06/07/17   Daron Offer, Richard Miu, MD    Family History Family History  Problem Relation Age  of Onset  . Diabetes Mother   . Heart attack Mother   . Diabetes Father   . Hypertension Father   . Heart attack Father   . Alcohol abuse Father     Social History Social History   Tobacco Use  . Smoking status: Current Every Day Smoker    Packs/day: 1.00    Years: 25.00    Pack years: 25.00    Types: Cigarettes  . Smokeless tobacco: Never Used  . Tobacco comment: Has cut back to 1 pack a day  Substance Use Topics  . Alcohol use: No  . Drug use: No     Allergies   Celebrex [celecoxib]; Hydrocodone; and Sulfa antibiotics   Review of Systems Review of Systems  Constitutional: Negative for fever.  Respiratory: Positive for shortness of breath.   Cardiovascular: Positive for chest pain.  Gastrointestinal: Negative for abdominal pain and nausea.  Musculoskeletal:       See HPI.  Skin: Negative.   Neurological: Negative.      Physical Exam Updated Vital Signs BP 135/82   Pulse 93   Temp 98.2 F (36.8 C) (Oral)   Resp 13   SpO2 96%   Physical Exam  Constitutional: He is oriented to person, place, and time. He appears well-developed and well-nourished.  HENT:  Head: Normocephalic.  Neck: Normal range of motion. Neck supple. Carotid bruit is not present.  Cardiovascular: Normal rate and regular rhythm.  No murmur heard. Pulmonary/Chest: Effort normal and breath sounds normal. He has no wheezes. He has no rhonchi. He has no rales.  Abdominal: Soft. Bowel sounds are normal. There is no tenderness. There is no rebound and no guarding.  Musculoskeletal: Normal range of motion. He exhibits no edema.  FROM all extremities including hands without obvious cramping. No swelling or redness of hands.   Neurological: He is alert and oriented to person, place, and time.  Skin: Skin is warm and dry.  Psychiatric: He has a normal mood and affect.     ED Treatments / Results  Labs (all labs ordered are listed, but only abnormal results are displayed) Labs Reviewed    BASIC METABOLIC PANEL  CBC  I-STAT TROPONIN, ED   Results for orders placed or performed during the hospital encounter of 44/03/47  Basic metabolic panel  Result Value Ref Range   Sodium 132 (L) 135 - 145 mmol/L  Potassium 4.0 3.5 - 5.1 mmol/L   Chloride 95 (L) 101 - 111 mmol/L   CO2 23 22 - 32 mmol/L   Glucose, Bld 602 (HH) 65 - 99 mg/dL   BUN 15 6 - 20 mg/dL   Creatinine, Ser 1.17 0.61 - 1.24 mg/dL   Calcium 9.4 8.9 - 10.3 mg/dL   GFR calc non Af Amer >60 >60 mL/min   GFR calc Af Amer >60 >60 mL/min   Anion gap 14 5 - 15  CBC  Result Value Ref Range   WBC 6.3 4.0 - 10.5 K/uL   RBC 5.19 4.22 - 5.81 MIL/uL   Hemoglobin 14.8 13.0 - 17.0 g/dL   HCT 43.0 39.0 - 52.0 %   MCV 82.9 78.0 - 100.0 fL   MCH 28.5 26.0 - 34.0 pg   MCHC 34.4 30.0 - 36.0 g/dL   RDW 12.9 11.5 - 15.5 %   Platelets 233 150 - 400 K/uL  I-stat troponin, ED  Result Value Ref Range   Troponin i, poc 0.00 0.00 - 0.08 ng/mL   Comment 3          CBG monitoring, ED  Result Value Ref Range   Glucose-Capillary 301 (H) 65 - 99 mg/dL  I-stat troponin, ED  Result Value Ref Range   Troponin i, poc 0.00 0.00 - 0.08 ng/mL   Comment 3          CBG monitoring, ED  Result Value Ref Range   Glucose-Capillary 232 (H) 65 - 99 mg/dL    EKG EKG Interpretation  Date/Time:  Saturday August 25 2017 18:02:33 EDT Ventricular Rate:  95 PR Interval:  166 QRS Duration: 98 QT Interval:  348 QTC Calculation: 437 R Axis:   50 Text Interpretation:  Normal sinus rhythm Nonspecific T wave abnormality Abnormal ECG no significant change compared to Mar 2019 Confirmed by Sherwood Gambler [phone]) on 08/25/2017 6:27:00 PM   Radiology No results found.  Procedures Procedures (including critical care time)  Medications Ordered in ED Medications - No data to display   Initial Impression / Assessment and Plan / ED Course  I have reviewed the triage vital signs and the nursing notes.  Pertinent labs & imaging results that were  available during my care of the patient were reviewed by me and considered in my medical decision making (see chart for details).     Patient presents for evaluation of brief episode chest pain x 1 and subsequent cramping of bilateral hands.   He is found to have non-ketotic hyperglycemia that responds well to fluids and insulin. No further chest pain. Troponin and delta troponin are negative. EKG nonacute. Doubt ACS. Hands cramping, likely due to dehydration and hyperglycemia. Better with IV valium. VSS.   He is felt appropriate for discharge home. Patient and wife are comfortable with discharge.   Final Clinical Impressions(s) / ED Diagnoses   Final diagnoses:  None   1. Nonspecific chest pain 2. Hyperglycemia without ketosis 3. Muscle cramping  ED Discharge Orders    None       Charlann Lange, Hershal Coria 08/25/17 2158    Sherwood Gambler, MD 08/26/17 [phone]

## 2017-08-25 NOTE — ED Notes (Signed)
Pt verbalizes understanding of d/c instructions. Pt ambulatory at d/c with all belongings and with family.   

## 2017-08-25 NOTE — Discharge Instructions (Addendum)
Follow up with your doctor for recheck early next week. Return here with any worsening symptoms or new concerns.

## 2017-10-04 DIAGNOSIS — Z0271 Encounter for disability determination: Secondary | ICD-10-CM

## 2017-10-08 ENCOUNTER — Ambulatory Visit (HOSPITAL_COMMUNITY): Payer: Self-pay | Admitting: Psychiatry

## 2017-10-09 ENCOUNTER — Ambulatory Visit (HOSPITAL_COMMUNITY): Payer: Medicare Other | Admitting: Psychiatry

## 2017-10-09 ENCOUNTER — Other Ambulatory Visit (HOSPITAL_COMMUNITY): Payer: Self-pay | Admitting: Psychiatry

## 2017-10-09 DIAGNOSIS — F4312 Post-traumatic stress disorder, chronic: Secondary | ICD-10-CM

## 2017-10-09 DIAGNOSIS — E559 Vitamin D deficiency, unspecified: Secondary | ICD-10-CM

## 2017-10-09 DIAGNOSIS — F5104 Psychophysiologic insomnia: Secondary | ICD-10-CM

## 2017-10-09 DIAGNOSIS — F331 Major depressive disorder, recurrent, moderate: Secondary | ICD-10-CM

## 2017-10-19 ENCOUNTER — Other Ambulatory Visit (HOSPITAL_COMMUNITY): Payer: Self-pay | Admitting: Psychiatry

## 2017-10-19 DIAGNOSIS — F4312 Post-traumatic stress disorder, chronic: Secondary | ICD-10-CM

## 2017-10-31 ENCOUNTER — Ambulatory Visit (INDEPENDENT_AMBULATORY_CARE_PROVIDER_SITE_OTHER): Payer: Medicare Other | Admitting: Orthopedic Surgery
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
## 2017-11-03 ENCOUNTER — Encounter (HOSPITAL_COMMUNITY): Payer: Self-pay | Admitting: Psychiatry

## 2017-11-03 ENCOUNTER — Ambulatory Visit (INDEPENDENT_AMBULATORY_CARE_PROVIDER_SITE_OTHER): Payer: 59 | Admitting: Psychiatry

## 2017-11-03 VITALS — BP 152/83 | HR 82 | Ht 72.0 in | Wt 238.6 lb

## 2017-11-03 DIAGNOSIS — F4312 Post-traumatic stress disorder, chronic: Secondary | ICD-10-CM

## 2017-11-03 DIAGNOSIS — F3131 Bipolar disorder, current episode depressed, mild: Secondary | ICD-10-CM | POA: Diagnosis not present

## 2017-11-03 MED ORDER — MIRTAZAPINE 45 MG PO TABS: 45.0000 mg | ORAL_TABLET | Freq: Every day | ORAL | 0 refills | Status: DC

## 2017-11-03 MED ORDER — VENLAFAXINE HCL ER 150 MG PO CP24: 300.0000 mg | ORAL_CAPSULE | Freq: Every day | ORAL | 0 refills | Status: DC

## 2017-11-03 MED ORDER — ALPRAZOLAM 0.5 MG PO TABS: ORAL_TABLET | ORAL | 2 refills | Status: DC

## 2017-11-03 MED ORDER — ARIPIPRAZOLE 2 MG PO TABS: ORAL_TABLET | ORAL | 0 refills | Status: DC

## 2017-11-03 MED ORDER — ARIPIPRAZOLE 5 MG PO TABS: ORAL_TABLET | ORAL | 0 refills | Status: DC

## 2017-11-03 NOTE — Patient Instructions (Signed)
1. Continue Abilfy 7 mg at night 2. Continue mirtazapine 45 mg at night 3. Continue Effexor 300 mg daily 4. Continue Xanax 0.5 mg in AM, 1 mg at night 5. Return to clinic in three months

## 2017-11-20 ENCOUNTER — Ambulatory Visit (INDEPENDENT_AMBULATORY_CARE_PROVIDER_SITE_OTHER): Payer: 59 | Admitting: Psychology

## 2017-11-20 ENCOUNTER — Encounter

## 2017-11-20 DIAGNOSIS — F4312 Post-traumatic stress disorder, chronic: Secondary | ICD-10-CM

## 2017-11-20 DIAGNOSIS — F3181 Bipolar II disorder: Secondary | ICD-10-CM | POA: Diagnosis not present
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
## 2017-12-03 ENCOUNTER — Ambulatory Visit (INDEPENDENT_AMBULATORY_CARE_PROVIDER_SITE_OTHER): Payer: Medicare Other | Admitting: Psychology

## 2017-12-03 ENCOUNTER — Encounter (INDEPENDENT_AMBULATORY_CARE_PROVIDER_SITE_OTHER): Payer: Self-pay

## 2017-12-03 DIAGNOSIS — F3181 Bipolar II disorder: Secondary | ICD-10-CM

## 2017-12-03 NOTE — Progress Notes (Signed)
   THERAPIST PROGRESS NOTE  Session Time: 8.05am-8.35am  Participation Level: Active  Behavioral Response: Well GroomedAlertaffect bright  Type of Therapy: Individual Therapy  Treatment Goals addressed: Diagnosis: Bipolar 2 d/o  Interventions: CBT and Supportive  Summary: Vincent [name] is a 52 y.o. male who presents with affect wnl.  Pt reported that he mood has been improved but still struggling w/ some conflict w/ wife.  Pt reports has been recent as she is wanting more intimacy and his sexual drive is low.  Pt also reports his sister moved in 5 weeks ago and that this is also causing some tension as feels inbetween wife and sister complaints of each other.  Pt reports his sleep has been difficult as well- mind ruminating at night- difficulty falling asleep. Pt also reports feels little burned out w/ work- only working or sleeping.Pt receptive to grounding exercises and need for taking time for enjoyable activities.   Suicidal/Homicidal: Nowithout intent/plan  Therapist Response: Assessed pt current functioning per pt report.  Processed w/pt interactions w/ wife and sister.  Discussed active listening and reflecting feelings skills.  Discussed and walked through grounding skills.   Plan: Return again in 2 weeks.  Diagnosis: Bipolar 2   Aishia Barkey, LPC 12/03/2017

## 2017-12-11 DIAGNOSIS — Z87898 Personal history of other specified conditions: Secondary | ICD-10-CM

## 2017-12-11 HISTORY — DX: Personal history of other specified conditions: Z87.898

## 2017-12-17 ENCOUNTER — Ambulatory Visit (HOSPITAL_COMMUNITY): Payer: Self-pay | Admitting: Psychology

## 2017-12-18 ENCOUNTER — Encounter (HOSPITAL_COMMUNITY): Payer: Self-pay | Admitting: Psychiatry

## 2017-12-18 ENCOUNTER — Other Ambulatory Visit (HOSPITAL_COMMUNITY): Payer: Self-pay

## 2017-12-18 ENCOUNTER — Ambulatory Visit (INDEPENDENT_AMBULATORY_CARE_PROVIDER_SITE_OTHER): Payer: 59 | Admitting: Psychiatry

## 2017-12-18 VITALS — BP 140/80 | HR 74 | Ht 72.0 in | Wt 237.0 lb

## 2017-12-18 DIAGNOSIS — F4312 Post-traumatic stress disorder, chronic: Secondary | ICD-10-CM | POA: Diagnosis not present

## 2017-12-18 DIAGNOSIS — F17210 Nicotine dependence, cigarettes, uncomplicated: Secondary | ICD-10-CM | POA: Diagnosis not present

## 2017-12-18 DIAGNOSIS — Z79899 Other long term (current) drug therapy: Secondary | ICD-10-CM

## 2017-12-18 MED ORDER — ARIPIPRAZOLE 10 MG PO TABS: 10.0000 mg | ORAL_TABLET | Freq: Every day | ORAL | 0 refills | Status: DC

## 2017-12-18 MED ORDER — MIRTAZAPINE 15 MG PO TABS: 15.0000 mg | ORAL_TABLET | Freq: Every day | ORAL | 0 refills | Status: DC

## 2017-12-18 MED ORDER — ALPRAZOLAM 0.5 MG PO TABS: 0.5000 mg | ORAL_TABLET | Freq: Two times a day (BID) | ORAL | 2 refills | Status: DC | PRN
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
## 2017-12-19 LAB — HEMOGLOBIN A1C
Est. average glucose Bld gHb Est-mCnc: 214 mg/dL
Hgb A1c MFr Bld: 9.1 % — ABNORMAL HIGH (ref 4.8–5.6)

## 2017-12-19 LAB — LIPID PANEL WITH LDL/HDL RATIO
Cholesterol, Total: 337 mg/dL — ABNORMAL HIGH (ref 100–199)
HDL: 23 mg/dL — ABNORMAL LOW (ref >39)
Triglycerides: 1744 mg/dL (ref 0–149)

## 2017-12-24 ENCOUNTER — Ambulatory Visit (HOSPITAL_COMMUNITY): Payer: 59 | Admitting: Psychiatry

## 2017-12-25 ENCOUNTER — Other Ambulatory Visit: Payer: Self-pay | Admitting: Family Medicine

## 2017-12-25 DIAGNOSIS — R29898 Other symptoms and signs involving the musculoskeletal system: Secondary | ICD-10-CM

## 2018-01-01 ENCOUNTER — Emergency Department (HOSPITAL_COMMUNITY): Payer: 59

## 2018-01-01 ENCOUNTER — Observation Stay (HOSPITAL_COMMUNITY)
Admission: EM | Admit: 2018-01-01 | Discharge: 2018-01-03 | Disposition: A | Discharge status: Home / Self Care | Payer: 59 | Attending: Family Medicine | Admitting: Family Medicine

## 2018-01-01 ENCOUNTER — Other Ambulatory Visit: Payer: Self-pay

## 2018-01-01 ENCOUNTER — Encounter (HOSPITAL_COMMUNITY): Payer: Self-pay

## 2018-01-01 DIAGNOSIS — R000 Tachycardia, unspecified: Secondary | ICD-10-CM | POA: Insufficient documentation

## 2018-01-01 DIAGNOSIS — R079 Chest pain, unspecified: Secondary | ICD-10-CM | POA: Diagnosis present

## 2018-01-01 DIAGNOSIS — Z8673 Personal history of transient ischemic attack (TIA), and cerebral infarction without residual deficits: Secondary | ICD-10-CM | POA: Insufficient documentation

## 2018-01-01 DIAGNOSIS — Z8739 Personal history of other diseases of the musculoskeletal system and connective tissue: Secondary | ICD-10-CM

## 2018-01-01 DIAGNOSIS — E1165 Type 2 diabetes mellitus with hyperglycemia: Secondary | ICD-10-CM | POA: Diagnosis not present

## 2018-01-01 DIAGNOSIS — E785 Hyperlipidemia, unspecified: Secondary | ICD-10-CM | POA: Insufficient documentation

## 2018-01-01 DIAGNOSIS — E039 Hypothyroidism, unspecified: Secondary | ICD-10-CM | POA: Diagnosis present

## 2018-01-01 DIAGNOSIS — Z8719 Personal history of other diseases of the digestive system: Secondary | ICD-10-CM | POA: Diagnosis not present

## 2018-01-01 DIAGNOSIS — G4700 Insomnia, unspecified: Secondary | ICD-10-CM | POA: Diagnosis not present

## 2018-01-01 DIAGNOSIS — Z87442 Personal history of urinary calculi: Secondary | ICD-10-CM | POA: Insufficient documentation

## 2018-01-01 DIAGNOSIS — Z794 Long term (current) use of insulin: Secondary | ICD-10-CM | POA: Diagnosis not present

## 2018-01-01 DIAGNOSIS — Z833 Family history of diabetes mellitus: Secondary | ICD-10-CM | POA: Insufficient documentation

## 2018-01-01 DIAGNOSIS — Z8551 Personal history of malignant neoplasm of bladder: Secondary | ICD-10-CM | POA: Insufficient documentation

## 2018-01-01 DIAGNOSIS — Z8249 Family history of ischemic heart disease and other diseases of the circulatory system: Secondary | ICD-10-CM | POA: Insufficient documentation

## 2018-01-01 DIAGNOSIS — Z9049 Acquired absence of other specified parts of digestive tract: Secondary | ICD-10-CM | POA: Insufficient documentation

## 2018-01-01 DIAGNOSIS — E1143 Type 2 diabetes mellitus with diabetic autonomic (poly)neuropathy: Secondary | ICD-10-CM | POA: Diagnosis not present

## 2018-01-01 DIAGNOSIS — Z882 Allergy status to sulfonamides status: Secondary | ICD-10-CM | POA: Diagnosis not present

## 2018-01-01 DIAGNOSIS — K219 Gastro-esophageal reflux disease without esophagitis: Secondary | ICD-10-CM | POA: Diagnosis not present

## 2018-01-01 DIAGNOSIS — Z79890 Hormone replacement therapy (postmenopausal): Secondary | ICD-10-CM | POA: Insufficient documentation

## 2018-01-01 DIAGNOSIS — F3181 Bipolar II disorder: Secondary | ICD-10-CM | POA: Diagnosis present

## 2018-01-01 DIAGNOSIS — G8929 Other chronic pain: Secondary | ICD-10-CM | POA: Diagnosis present

## 2018-01-01 DIAGNOSIS — R0789 Other chest pain: Principal | ICD-10-CM | POA: Insufficient documentation

## 2018-01-01 DIAGNOSIS — I10 Essential (primary) hypertension: Secondary | ICD-10-CM | POA: Insufficient documentation

## 2018-01-01 DIAGNOSIS — Z79899 Other long term (current) drug therapy: Secondary | ICD-10-CM | POA: Insufficient documentation

## 2018-01-01 DIAGNOSIS — F17210 Nicotine dependence, cigarettes, uncomplicated: Secondary | ICD-10-CM | POA: Insufficient documentation

## 2018-01-01 DIAGNOSIS — J449 Chronic obstructive pulmonary disease, unspecified: Secondary | ICD-10-CM | POA: Diagnosis not present

## 2018-01-01 DIAGNOSIS — Z886 Allergy status to analgesic agent status: Secondary | ICD-10-CM | POA: Insufficient documentation

## 2018-01-01 DIAGNOSIS — R072 Precordial pain: Secondary | ICD-10-CM

## 2018-01-01 DIAGNOSIS — I2510 Atherosclerotic heart disease of native coronary artery without angina pectoris: Secondary | ICD-10-CM | POA: Diagnosis not present

## 2018-01-01 DIAGNOSIS — M069 Rheumatoid arthritis, unspecified: Secondary | ICD-10-CM | POA: Insufficient documentation

## 2018-01-01 DIAGNOSIS — M1990 Unspecified osteoarthritis, unspecified site: Secondary | ICD-10-CM | POA: Insufficient documentation

## 2018-01-01 DIAGNOSIS — Z9889 Other specified postprocedural states: Secondary | ICD-10-CM | POA: Insufficient documentation

## 2018-01-01 DIAGNOSIS — Z885 Allergy status to narcotic agent status: Secondary | ICD-10-CM | POA: Insufficient documentation

## 2018-01-01 LAB — BASIC METABOLIC PANEL
Anion gap: 13 (ref 5–15)
BUN: 14 mg/dL (ref 6–20)
CO2: 22 mmol/L (ref 22–32)
Calcium: 9.5 mg/dL (ref 8.9–10.3)
Chloride: 102 mmol/L (ref 98–111)
Creatinine, Ser: 1.09 mg/dL (ref 0.61–1.24)
GFR calc Af Amer: >60 mL/min (ref >60)
GFR calc non Af Amer: >60 mL/min (ref >60)
Glucose, Bld: 370 mg/dL — ABNORMAL HIGH (ref 70–99)
Potassium: 4.1 mmol/L (ref 3.5–5.1)
Sodium: 137 mmol/L (ref 135–145)

## 2018-01-01 LAB — CBC
HCT: 47 % (ref 39.0–52.0)
Hemoglobin: 16.5 g/dL (ref 13.0–17.0)
MCH: 30.3 pg (ref 26.0–34.0)
MCHC: 35.1 g/dL (ref 30.0–36.0)
MCV: 86.4 fL (ref 80.0–100.0)
Platelets: 240 10*3/uL (ref 150–400)
RBC: 5.44 MIL/uL (ref 4.22–5.81)
RDW: 12.2 % (ref 11.5–15.5)
WBC: 7.4 10*3/uL (ref 4.0–10.5)
nRBC: 0.3 % — ABNORMAL HIGH (ref 0.0–0.2)

## 2018-01-01 LAB — GLUCOSE, CAPILLARY: Glucose-Capillary: 251 mg/dL — ABNORMAL HIGH (ref 70–99)

## 2018-01-01 LAB — I-STAT TROPONIN, ED: Troponin i, poc: 0 ng/mL (ref 0.00–0.08)

## 2018-01-01 LAB — TROPONIN I: Troponin I: <0.03 ng/mL (ref <0.03)

## 2018-01-01 MED ORDER — METOCLOPRAMIDE HCL 10 MG PO TABS
10.0000 mg | ORAL_TABLET | Freq: Every day | ORAL | Status: DC
  Administered 2018-01-02 – 2018-01-03 (×2): 10 mg via ORAL
  Filled 2018-01-01 (×3): qty 1

## 2018-01-01 MED ORDER — SODIUM CHLORIDE 0.9% FLUSH: 3.0000 mL | Freq: Two times a day (BID) | INTRAVENOUS | Status: DC

## 2018-01-01 MED ORDER — ACETAMINOPHEN 325 MG PO TABS: 650.0000 mg | ORAL_TABLET | ORAL | Status: DC | PRN

## 2018-01-01 MED ORDER — INSULIN ASPART 100 UNIT/ML ~~LOC~~ SOLN
0.0000 [IU] | Freq: Every day | SUBCUTANEOUS | Status: DC
  Administered 2018-01-01: 3 [IU] via SUBCUTANEOUS
  Administered 2018-01-02: 4 [IU] via SUBCUTANEOUS

## 2018-01-01 MED ORDER — INSULIN ASPART 100 UNIT/ML ~~LOC~~ SOLN
0.0000 [IU] | Freq: Three times a day (TID) | SUBCUTANEOUS | Status: DC
  Administered 2018-01-02: 7 [IU] via SUBCUTANEOUS
  Administered 2018-01-02: 5 [IU] via SUBCUTANEOUS
  Administered 2018-01-03: 3 [IU] via SUBCUTANEOUS

## 2018-01-01 MED ORDER — LEVOTHYROXINE SODIUM 25 MCG PO TABS
25.0000 ug | ORAL_TABLET | Freq: Every day | ORAL | Status: DC
  Administered 2018-01-02 – 2018-01-03 (×2): 25 ug via ORAL
  Filled 2018-01-01 (×2): qty 1

## 2018-01-01 MED ORDER — SODIUM CHLORIDE 0.9 % IV SOLN: 250.0000 mL | INTRAVENOUS | Status: DC | PRN

## 2018-01-01 MED ORDER — FENTANYL 75 MCG/HR TD PT72
75.0000 ug | MEDICATED_PATCH | TRANSDERMAL | Status: DC
  Administered 2018-01-02: 75 ug via TRANSDERMAL
  Filled 2018-01-01: qty 1

## 2018-01-01 MED ORDER — PREGABALIN 100 MG PO CAPS
200.0000 mg | ORAL_CAPSULE | Freq: Every day | ORAL | Status: DC
  Administered 2018-01-01 – 2018-01-02 (×2): 200 mg via ORAL
  Filled 2018-01-01 (×2): qty 2

## 2018-01-01 MED ORDER — HYDROXYCHLOROQUINE SULFATE 200 MG PO TABS
200.0000 mg | ORAL_TABLET | Freq: Two times a day (BID) | ORAL | Status: DC
  Administered 2018-01-01 – 2018-01-03 (×4): 200 mg via ORAL
  Filled 2018-01-01 (×4): qty 1

## 2018-01-01 MED ORDER — ENOXAPARIN SODIUM 40 MG/0.4ML ~~LOC~~ SOLN
40.0000 mg | SUBCUTANEOUS | Status: DC
  Administered 2018-01-01 – 2018-01-02 (×2): 40 mg via SUBCUTANEOUS
  Filled 2018-01-01 (×2): qty 0.4

## 2018-01-01 MED ORDER — PANTOPRAZOLE SODIUM 40 MG PO TBEC
40.0000 mg | DELAYED_RELEASE_TABLET | Freq: Every day | ORAL | Status: DC
  Administered 2018-01-02 – 2018-01-03 (×2): 40 mg via ORAL
  Filled 2018-01-01 (×2): qty 1

## 2018-01-01 MED ORDER — ARIPIPRAZOLE 10 MG PO TABS
10.0000 mg | ORAL_TABLET | Freq: Every day | ORAL | Status: DC
  Administered 2018-01-02 – 2018-01-03 (×2): 10 mg via ORAL
  Filled 2018-01-01 (×2): qty 1
  Filled 2018-01-01: qty 2

## 2018-01-01 MED ORDER — INSULIN ASPART 100 UNIT/ML ~~LOC~~ SOLN
6.0000 [IU] | Freq: Three times a day (TID) | SUBCUTANEOUS | Status: DC
  Administered 2018-01-02 – 2018-01-03 (×2): 6 [IU] via SUBCUTANEOUS

## 2018-01-01 MED ORDER — LAMOTRIGINE 100 MG PO TABS
200.0000 mg | ORAL_TABLET | Freq: Two times a day (BID) | ORAL | Status: DC
  Administered 2018-01-01 – 2018-01-03 (×4): 200 mg via ORAL
  Filled 2018-01-01 (×4): qty 2

## 2018-01-01 MED ORDER — PREGABALIN 100 MG PO CAPS
100.0000 mg | ORAL_CAPSULE | Freq: Every day | ORAL | Status: DC
  Administered 2018-01-02 – 2018-01-03 (×2): 100 mg via ORAL
  Filled 2018-01-01 (×2): qty 1

## 2018-01-01 MED ORDER — SODIUM CHLORIDE 0.9% FLUSH: 3.0000 mL | INTRAVENOUS | Status: DC | PRN

## 2018-01-01 MED ORDER — ATORVASTATIN CALCIUM 20 MG PO TABS
40.0000 mg | ORAL_TABLET | Freq: Every day | ORAL | Status: DC
  Administered 2018-01-02: 40 mg via ORAL
  Filled 2018-01-01: qty 2

## 2018-01-01 MED ORDER — VENLAFAXINE HCL ER 150 MG PO CP24
300.0000 mg | ORAL_CAPSULE | Freq: Every day | ORAL | Status: DC
  Administered 2018-01-02 – 2018-01-03 (×2): 300 mg via ORAL
  Filled 2018-01-01 (×2): qty 2
  Filled 2018-01-01: qty 4

## 2018-01-01 MED ORDER — METOPROLOL TARTRATE 25 MG PO TABS
25.0000 mg | ORAL_TABLET | Freq: Two times a day (BID) | ORAL | Status: DC
  Administered 2018-01-01 – 2018-01-03 (×4): 25 mg via ORAL
  Filled 2018-01-01 (×4): qty 1

## 2018-01-01 MED ORDER — VITAMIN D 1000 UNITS PO TABS
2000.0000 [IU] | ORAL_TABLET | Freq: Every day | ORAL | Status: DC
  Administered 2018-01-02 – 2018-01-03 (×2): 2000 [IU] via ORAL
  Filled 2018-01-01 (×3): qty 2

## 2018-01-01 MED ORDER — NITROGLYCERIN 0.4 MG SL SUBL: 0.4000 mg | SUBLINGUAL_TABLET | SUBLINGUAL | Status: DC | PRN

## 2018-01-01 MED ORDER — GABAPENTIN 600 MG PO TABS
1200.0000 mg | ORAL_TABLET | Freq: Two times a day (BID) | ORAL | Status: DC
  Administered 2018-01-01 – 2018-01-03 (×4): 1200 mg via ORAL
  Filled 2018-01-01 (×4): qty 2

## 2018-01-01 MED ORDER — ALPRAZOLAM 0.5 MG PO TABS: 0.5000 mg | ORAL_TABLET | Freq: Two times a day (BID) | ORAL | Status: DC | PRN

## 2018-01-01 MED ORDER — SODIUM CHLORIDE 0.9 % IV SOLN
INTRAVENOUS | Status: DC
  Administered 2018-01-01 – 2018-01-03 (×2): via INTRAVENOUS

## 2018-01-01 MED ORDER — ONDANSETRON HCL 4 MG/2ML IJ SOLN: 4.0000 mg | Freq: Four times a day (QID) | INTRAMUSCULAR | Status: DC | PRN

## 2018-01-01 MED ORDER — MIRTAZAPINE 15 MG PO TABS
15.0000 mg | ORAL_TABLET | Freq: Every day | ORAL | Status: DC
  Administered 2018-01-01 – 2018-01-02 (×2): 15 mg via ORAL
  Filled 2018-01-01 (×2): qty 1

## 2018-01-01 MED ORDER — INSULIN GLARGINE 100 UNIT/ML ~~LOC~~ SOLN
25.0000 [IU] | Freq: Two times a day (BID) | SUBCUTANEOUS | Status: DC
  Administered 2018-01-01 – 2018-01-02 (×2): 25 [IU] via SUBCUTANEOUS
  Filled 2018-01-01 (×3): qty 0.25

## 2018-01-01 NOTE — Consult Note (Addendum)
Cardiology Consult    Patient ID: MADDEN GARRON MRN: [id number], DOB/AGE: 05-16-1965   Admit date: 01/01/2018 Date of Consult: 01/01/2018  Primary Physician: Leonard Downing, MD Primary Cardiologist: No primary care provider on file. Requesting Provider: Dr. Dayna Barker  Patient Profile    Felder Lebeda Dome is a 52 y.o. male with no significant disease on cardiac catheterizations in 2003 and 2011, hyperlipidemia, poorly controlled type 2 diabetes mellitus, COPD, history of TIA, hypothyroidism, tobacco use, and multiple psychiatric disorders who is being seen today for the evaluation of chest pain at the request of Dr. Dayna Barker.  History of Present Illness    Mr. Filsaime is a 52 year old Caucasian male with no significant disease noted on most recent catheterization in 2011. Patient states he does not have a primary Cardiologist. Patient denies any recent illness. He reports intermittent substernal chest pain with radiation and numbness/tingling over the last month. Patient describes the pain as a "weight" on his chest and a "deep dull" feeling. Patient first noticed chest pain while working in his yard. Patient states the pain is worse with exertion and resolves with rest. The pain is becoming more frequent and more intense. At its worse, he ranks the pain as a 7-8/10. Patient notes associated shortness of breath, lightheadedness/dizziness, and palpitations at times. He reports associated diaphoresis on 1-2 occasions. He has not noticed any correlation with position or meals. He has not tried any medications for the pain. He does have chronic pain syndrome and is on a Fentanyl patch. Patient went to see his PCP today and was told to come to the ED for further evaluation of unstable angina.  Upon arrival to the ED, vitals stable. EKG showed sinus tachycardia with rate of 109 bpm and no acute ST changes. I-stat troponin unremarkable. Chest x-ray showed no acute findings. CBC unremarkable. Na 137, P  4.1, Glucose 370, SCr 1.09.  Patient currently denies any chest pain. He reports his last episode of chest pain was 3-4 hours ago in the waiting room of the ED. He states the pain resolved independently without any nitroglycerin.   Of note, patient has been smoking since the age of 42 years and has a 60+ pack year smoking history. He also has a family history of cardiac disease on his father's side of the family. His father had his first MI at 32 years old and died of an MI at the age of 50 years old.   Past Medical History   Past Medical History:  Diagnosis Date  . Anxiety   . Arthritis   . Bipolar 1 disorder ([place])   . Chronic pain syndrome    back  . COPD (chronic obstructive pulmonary disease) ([place])   . Gastroparesis   . GERD (gastroesophageal reflux disease)   . Hiatal hernia   . History of bladder cancer urologist-  dr Consuella Lose   papillay TCC (Ta G1)  s/p TURBT and chemo instillation 2014  . History of encephalopathy 05/27/2015   admission w/ acute encephalopathy thought to be secondary to pain meds and COPD  . History of gastric ulcer   . History of kidney stones   . History of TIA (transient ischemic attack) 2008    no residual  . History of traumatic head injury 2010   w/ LOC  per pt needed stitches  . Hyperlipidemia   . Hypothyroidism   . Insomnia    per sleep study 04-19-2015 without sleep apnea  . Neuropathy in diabetes ([place])  LOWER EXTREMITIES  . Poor historian   . Seizures, transient [facility]) neurologist-  dr Krista Blue--  differential dx complex partial seizure .vs.  mood disorder .vs.  pseudoseizure--  negative EEG's   confusion episodes and staring spells since 11/ 2015  . Transient confusion NEUOROLOGIST-  DR YAN   Episodes since 11/ 2015--  neurologist dx  differential complex partial seizure  .vs. mood disorder . vs. pseudoseizure  . Type 2 diabetes mellitus treated with insulin ([place])   . Urinary hesitancy     Past Surgical History:  Procedure Laterality Date  .  CARDIAC CATHETERIZATION  12-27-2001  DR Einar Gip  &  05-26-2009  DR Irish Lack   RESULTS FOR BOTH ARE NORMAL CORONARIES AND PERSERVED LVF/ EF 60%  . CARPAL TUNNEL RELEASE Right 09-16-2003  . CARPAL TUNNEL RELEASE Left 02/25/2015   Procedure: LEFT CARPAL TUNNEL RELEASE;  Surgeon: Leanora Cover, MD;  Location: [place];  Service: Orthopedics;  Laterality: Left;  . CYSTOSCOPY N/A 10/10/2012   Procedure: CYSTOSCOPY CLOT EVACUATION FULGERATION OF BLEEDERS ;  Surgeon: Claybon Jabs, MD;  Location: [facility];  Service: Urology;  Laterality: N/A;  . CYSTOSCOPY WITH BIOPSY N/A 11/26/2015   Procedure: CYSTOSCOPY WITH BIOPSY AND FULGURATION;  Surgeon: Kathie Rhodes, MD;  Location: [place];  Service: Urology;  Laterality: N/A;  . LAPAROSCOPIC CHOLECYSTECTOMY  11-17-2010  . NEGATIVE SLEEP STUDY  04-19-2015  in epic  . ORCHIECTOMY Right 02/21/2016   Procedure: SCROTAL ORCHIECTOMY with TESTICULAR PROSTHESIS IMPLANT;  Surgeon: Kathie Rhodes, MD;  Location: [facility];  Service: Urology;  Laterality: Right;  . ROTATOR CUFF REPAIR Right 12/2004  . TRANSURETHRAL RESECTION OF BLADDER TUMOR N/A 08/09/2012   Procedure: TRANSURETHRAL RESECTION OF BLADDER TUMOR (TURBT) WITH GYRUS WITH MITOMYCIN C;  Surgeon: Claybon Jabs, MD;  Location: [facility];  Service: Urology;  Laterality: N/A;  . TRANSURETHRAL RESECTION OF BLADDER TUMOR WITH GYRUS (TURBT-GYRUS) N/A 02/27/2014   Procedure: TRANSURETHRAL RESECTION OF BLADDER TUMOR WITH GYRUS (TURBT-GYRUS);  Surgeon: Claybon Jabs, MD;  Location: [facility];  Service: Urology;  Laterality: N/A;     Allergies  Allergies  Allergen Reactions  . Celebrex [Celecoxib] Anaphylaxis and Rash  . Hydrocodone Rash and Other (See Comments)    "blisters developed on arms"  . Sulfa Antibiotics Rash    Inpatient Medications      Family History    Family History  Problem Relation Age of  Onset  . Diabetes Mother   . Heart attack Mother   . Diabetes Father   . Hypertension Father   . Heart attack Father   . Alcohol abuse Father    He indicated that his mother is deceased. He indicated that his father is deceased. He indicated that all of his three sisters are alive.   Social History    Social History   Socioeconomic History  . Marital status: Married    Spouse name: Not on file  . Number of children: 3  . Years of education: GED  . Highest education level: Not on file  Occupational History  . Occupation: Engineer, technical sales    Comment: Owner of company  Social Needs  . Financial resource strain: Not on file  . Food insecurity:    Worry: Not on file    Inability: Not on file  . Transportation needs:    Medical: Not on file    Non-medical: Not on file  Tobacco Use  .  Smoking status: Current Every Day Smoker    Packs/day: 1.00    Years: 25.00    Pack years: 25.00    Types: Cigarettes  . Smokeless tobacco: Never Used  . Tobacco comment: Has cut back to 1 pack a day  Substance and Sexual Activity  . Alcohol use: No  . Drug use: No  . Sexual activity: Yes    Partners: Female    Birth control/protection: None  Lifestyle  . Physical activity:    Days per week: Not on file    Minutes per session: Not on file  . Stress: Not on file  Relationships  . Social connections:    Talks on phone: Not on file    Gets together: Not on file    Attends religious service: Not on file    Active member of club or organization: Not on file    Attends meetings of clubs or organizations: Not on file    Relationship status: Not on file  . Intimate partner violence:    Fear of current or ex partner: Not on file    Emotionally abused: Not on file    Physically abused: Not on file    Forced sexual activity: Not on file  Other Topics Concern  . Not on file  Social History Narrative   Lives at home with his wife and children.   Left-handed.   3-4 cups  caffeine per day.     Review of Systems    Review of Systems  Constitutional: Positive for diaphoresis and malaise/fatigue. Negative for chills and fever.  HENT: Negative for congestion.   Eyes: Positive for double vision.  Respiratory: Positive for cough (dry, smoker's cough). Negative for hemoptysis, sputum production and shortness of breath.   Cardiovascular: Positive for chest pain, palpitations and orthopnea. Negative for leg swelling and PND.  Gastrointestinal: Negative for abdominal pain, blood in stool, nausea and vomiting.  Genitourinary: Positive for dysuria. Negative for hematuria.  Musculoskeletal: Negative for myalgias.  Neurological: Positive for dizziness, tingling (left arm) and weakness. Negative for loss of consciousness.  Endo/Heme/Allergies: Does not bruise/bleed easily.  Psychiatric/Behavioral: Positive for substance abuse (tobacco use).    Physical Exam    Blood pressure 140/90, pulse 94, temperature 98.7 F (37.1 C), temperature source Oral, resp. rate 16, SpO2 93 %.  General: 52 y.o. overweight Caucasian male resting comfortably in no acute distress. Pleasant and cooperative. HEENT: Normal  Neck: Supple. No carotid bruits or JVD appreciated. Lungs: No increased work of breathing. Normal respiratory rate. Wheezing and rhonchi noted on exam. No rales appreciated. Heart: RRR. Distinct S1 and S2. No murmurs, gallops, or rubs.  Abdomen: Soft, non-distended, and non-tender to palpation. Bowel sounds present.   Extremities: No lower extremity edema. Distal pedal pulses 2+ and equal bilaterally. Neuro: Alert and oriented x3. No focal deficits. Moves all extremities spontaneously. Psych: Normal affect.  Labs    Troponin [facility] of Care Test) Recent Labs    01/01/18 1459  TROPIPOC 0.00   No results for input(s): CKTOTAL, CKMB, TROPONINI in the last 72 hours. Lab Results  Component Value Date   WBC 7.4 01/01/2018   HGB 16.5 01/01/2018   HCT 47.0 01/01/2018    MCV 86.4 01/01/2018   PLT 240 01/01/2018    Recent Labs  Lab 01/01/18 1453  NA 137  K 4.1  CL 102  CO2 22  BUN 14  CREATININE 1.09  CALCIUM 9.5  GLUCOSE 370*   Lab Results  Component Value Date  CHOL 337 (H) 12/18/2017   HDL 23 (L) 12/18/2017   [place] Comment 12/18/2017   TRIG 1,744 ([place]) 12/18/2017   Lab Results  Component Value Date   DDIMER 0.53 (H) 10/13/2010     Radiology Studies    Dg Chest 2 View  Result Date: 01/01/2018 CLINICAL DATA:  Chest pain EXAM: CHEST - 2 VIEW COMPARISON:  August 25, 2017 FINDINGS: There is no edema or consolidation. The heart size and pulmonary vascularity are normal. No adenopathy. There is evidence of old trauma involving the distal right clavicle. No pneumothorax. IMPRESSION: No edema or consolidation. Electronically Signed   By: Lowella Grip III M.D.   On: 01/01/2018 15:44    EKG     EKG: EKG was personally reviewed and demonstrates: Sinus tachycardia, rate 109 bpm, with no acute ST changes.   Telemetry: Telemetry was personally reviewed and demonstrates: Normal sinus rhythm with some bradycardia with ventricular trigeminy.   Cardiac Imaging    Catheterization 12/27/2001: HEMODYNAMIC DATA:  The left ventricular pressure square 8/6 end diastolic  pressure 18 mmHg.  Aortic pressure 108/77 with a mean of 93 mmHg.  There was  no pressure gradient across the aortic valve.   ANGIOGRAPHIC DATA:  1. Left ventricle.  Left ventricular systolic function was normal and EF was     estimated at 60%.  There is no significant mitral regurgitation.  2. Right coronary artery is a large caliber, dominant vessel.  It is smooth     and disease-free.  3. The left main coronary artery is a large caliber vessel.  It is normal.  4. Circumflex coronary artery is a large caliber vessel and it gives origin     to a large obtuse marginal 1 and continues as a small distal circumflex.     It is normal.  5. Ramus intermedius is very small and  measures about 1.0 mm.  It is normal.  6. Left anterior descending artery is a large vessel.  It gives origin to a     very large diagonal 1 which is equal to his left anterior descending     artery itself.  The diagonal 1 has multiple secondary branches.  They all     are disease-free.  7. Abdominal aortogram.  Abdominal aortogram revealed normal abdominal aorta     with widely patent renal arteries.  There is one renal artery on each     side.  8. Femoral angiography revealed good arterial access site.   IMPRESSION:  1. Normal left ventricular systolic function, ejection fraction 60%.  2. Normal coronary arteries.  3. Normal abdominal aorta with normal renal arteries.   RECOMMENDATIONS:  At this point, the chest pain is probably of noncardiac  etiology.  This probably is related to gastroesophageal reflux disease with  esophageal spasm and also COPD may be contributing to his shortness of  breath.  His LVEDP is also slightly elevated and better control of  hypertension is indicated.  Assessment & Plan    1. Chest Pain Concerning for Unstable Angina - Patient presents with intermittent substernal chest pain with radiation to the left arm with numbness/tingling over the last month. Pain worse with exertion.  - Patient had normal coronary arteries on catheterization in 2003 and 2011.  - EKG showed no ischemic changes. - I-stat troponin negative. Will trend serial troponin.  - Patient denies any angina at this time. - Given patient's presentation and cardiovascular risk factors (HLD, T2DM, TIA, and 60+ pack year smoking  history), would recommend further ischemic workup. Discussed with MD - will get coronary CT tomorrow. Orders have been placed. Will start Metoprolol to decrease heart rate.  2. Hyperlipemia - Lipid panel from 12/18/2017: total cholesterol 337, triglycerides 1,744, HDL 23, unable to calculate LDL due to triglycerides. - Will recheck fasting lipid panel in the  morning. - Continue high-dose statin. Will likely need to add fenofibrate.   3. Type 2 Diabetes Mellitus - Patient states diabetes is very difficult to control and his blood sugars typically run in the 400s.  - Hgb A1c 9.1 on 12/18/2017.  - Continue SSI per primary team.  4. COPD - Per primary team.   Signed, Darreld Mclean, PA-C 01/01/2018, 7:18 PM  For questions or updates, please contact   Please consult [email or web address] for contact info under Cardiology/STEMI.  History and all data above reviewed.  Patient examined.  I agree with the findings as above.  The patient has chest pain that occurs with exertion and occasionally at rest.  This is left of center and heavy with associated SOB.  It is 7/10 at peak and worsening over time.  It is happening with less exertion.  There are no acute EKG changes and initial enzymes are negative.  He is currently pain free and was not having pain when he came into the ED.  He does have cardiovascular risk factors.  Of note he did have this discomfort in the past and we were able to find caths from 2003 and 2011 in which he had no CAD.  The patient exam reveals COR:RRR  ,  Lungs: Clear  ,  Abd: Positive bowel sounds, no rebound no guarding, Ext No edema.    .  All available labs, radiology testing, previous records reviewed. Agree with documented assessment and plan.   Chest pain: Typical features but consistent with previous symptoms at which time he had no CAD (normal CORS). I would suggest coronary CTA if we can get his heart rate down and his enzymes remain negative.  Discussed with the patient.   DM:  Not controlled.  Plan per primary team.  Of note his triglycerides have ben very high recently and these should be repeated.  He might need fenofibrate.    Jeneen Rinks Samera Macy  8:54 PM  01/01/2018

## 2018-01-01 NOTE — ED Triage Notes (Signed)
Pt reports intermittent chest pain for several days. Described as someone sitting on his chest. Pt also endorses shortness of breath, denies n/v.

## 2018-01-01 NOTE — ED Provider Notes (Signed)
I saw and evaluated the patient, reviewed the resident's note and I agree with the findings and plan with the following exceptions.   Month of intermittent chest tightness associated with diaphoresis and dyspnea.  Not always related to exertion but sometimes does happen with exertion.  No fever, cough, congestion.  No swelling in his leg or calf pain. On exam he appears well.  Not tachycardic.  Chest pain is not reproducible.  Lungs are mostly clear.  No lower extremity swelling or Homans sign. High risk for unstable angina.  Will discuss with cardiology for admission.   EKG Interpretation  Date/Time:  Tuesday January 01 2018 14:39:41 EDT Ventricular Rate:  109 PR Interval:  150 QRS Duration: 86 QT Interval:  326 QTC Calculation: 439 R Axis:   64 Text Interpretation:  Sinus tachycardia Otherwise normal ECG faster rate otherwise no other changes from june Confirmed by Merrily Pew [phone]) on 01/01/2018 5:26:26 PM         Amberleigh Gerken, Corene Cornea, MD 01/02/18 2323

## 2018-01-01 NOTE — ED Provider Notes (Signed)
[facility] Provider Note   CSN: [id number] Arrival date & time: 01/01/18  1437     History   Chief Complaint Chief Complaint  Patient presents with  . Chest Pain    HPI Vincent [name] is a 52 y.o. male.  HPI Patient is a 52 year old male with a past medical history of COPD, CAD without stent most recent cath approximately 5 years ago, diabetes, hyperlipidemia, hypothyroidism, history of TIA, and multiple psychiatric disorders presents emergency department for evaluation of chest pain.  Patient reports that his chest pain is been ongoing for approximately 1 month.  Endorses exertional component to his chest pain.  States that with any significant activity he becomes short of breath and has left-sided chest pain will radiate to his left arm.  States that this pain does somewhat improved with rest, but has not completely gone away over the past few weeks.  Patient was seen by his primary care provider earlier today who gave him prescription for nitroglycerin but encouraged him to come to the emergency department for further evaluation and care.  Side of patient's chest pain he does endorse intermittent diaphoresis it is associated with his chest pain.  He has not had any associated nausea or vomiting.  Denies any pleuritic component to his chest pain or any trauma to the chest.  When asked to describe how the pain feels, he states "it feels like someone sitting on my chest."  Remaining review of systems negative at this time.  Past Medical History:  Diagnosis Date  . Anxiety   . Arthritis   . Bipolar 1 disorder ([place])   . Chronic pain syndrome    back  . COPD (chronic obstructive pulmonary disease) ([place])   . Gastroparesis   . GERD (gastroesophageal reflux disease)   . Hiatal hernia   . History of bladder cancer urologist-  dr Consuella Lose   papillay TCC (Ta G1)  s/p TURBT and chemo instillation 2014  . History of encephalopathy 05/27/2015   admission w/ acute encephalopathy thought to be secondary to pain meds and COPD  . History of gastric ulcer   . History of kidney stones   . History of TIA (transient ischemic attack) 2008    no residual  . History of traumatic head injury 2010   w/ LOC  per pt needed stitches  . Hyperlipidemia   . Hypothyroidism   . Insomnia    per sleep study 04-19-2015 without sleep apnea  . Neuropathy in diabetes ([place])    LOWER EXTREMITIES  . Seizures, transient [facility]) neurologist-  dr Krista Blue--  differential dx complex partial seizure .vs.  mood disorder .vs.  pseudoseizure--  negative EEG's   confusion episodes and staring spells since 11/ 2015  . Transient confusion NEUOROLOGIST-  DR YAN   Episodes since 11/ 2015--  neurologist dx  differential complex partial seizure  .vs. mood disorder . vs. pseudoseizure  . Type 2 diabetes mellitus treated with insulin ([place])   . Urinary hesitancy     Patient Active Problem List   Diagnosis Date Noted  . Chest pain 01/01/2018  . Seizure disorder ([place]) 12/19/2016  . Essential tremor 12/19/2016  . Polypharmacy 12/19/2016  . Chronic post-traumatic stress disorder (PTSD) 06/16/2016  . Tremor 06/16/2016  . Tobacco use disorder 05/05/2016  . Psychophysiological insomnia 06/02/2015  . Altered mental status 05/28/2015  . Acute bronchitis 05/28/2015  . Type 2 diabetes mellitus with hyperglycemia ([place]) 05/28/2015  . Hypothyroidism 05/28/2015  . Bipolar  2 disorder ([place]) 05/28/2015  . History of rheumatoid arthritis 05/28/2015  . Chronic pain 05/28/2015  . Depression 03/18/2015  . Diabetes ([place]) 01/23/2015  . Obesity (BMI 30-39.9) 03/06/2014  . Acute encephalopathy 03/05/2014  . HLD (hyperlipidemia) 03/05/2014  . Anemia, normocytic normochromic 03/05/2014  . Altered mental state   . Protein-calorie malnutrition, severe ([place]) 10/09/2012  . Bladder tumor 08/08/2012  . Biliary dyskinesia 11/02/2010    Past Surgical History:  Procedure Laterality Date  .  CARDIAC CATHETERIZATION  12-27-2001  DR Einar Gip  &  05-26-2009  DR Irish Lack   RESULTS FOR BOTH ARE NORMAL CORONARIES AND PERSERVED LVF/ EF 60%  . CARPAL TUNNEL RELEASE Right 09-16-2003  . CARPAL TUNNEL RELEASE Left 02/25/2015   Procedure: LEFT CARPAL TUNNEL RELEASE;  Surgeon: Leanora Cover, MD;  Location: [place];  Service: Orthopedics;  Laterality: Left;  . CYSTOSCOPY N/A 10/10/2012   Procedure: CYSTOSCOPY CLOT EVACUATION FULGERATION OF BLEEDERS ;  Surgeon: Claybon Jabs, MD;  Location: [facility];  Service: Urology;  Laterality: N/A;  . CYSTOSCOPY WITH BIOPSY N/A 11/26/2015   Procedure: CYSTOSCOPY WITH BIOPSY AND FULGURATION;  Surgeon: Kathie Rhodes, MD;  Location: [place];  Service: Urology;  Laterality: N/A;  . LAPAROSCOPIC CHOLECYSTECTOMY  11-17-2010  . NEGATIVE SLEEP STUDY  04-19-2015  in epic  . ORCHIECTOMY Right 02/21/2016   Procedure: SCROTAL ORCHIECTOMY with TESTICULAR PROSTHESIS IMPLANT;  Surgeon: Kathie Rhodes, MD;  Location: [facility];  Service: Urology;  Laterality: Right;  . ROTATOR CUFF REPAIR Right 12/2004  . TRANSURETHRAL RESECTION OF BLADDER TUMOR N/A 08/09/2012   Procedure: TRANSURETHRAL RESECTION OF BLADDER TUMOR (TURBT) WITH GYRUS WITH MITOMYCIN C;  Surgeon: Claybon Jabs, MD;  Location[facility];  Service: Urology;  Laterality: N/A;  . TRANSURETHRAL RESECTION OF BLADDER TUMOR WITH GYRUS (TURBT-GYRUS) N/A 02/27/2014   Procedure: TRANSURETHRAL RESECTION OF BLADDER TUMOR WITH GYRUS (TURBT-GYRUS);  Surgeon: Claybon Jabs, MD;  Location: [facility];  Service: Urology;  Laterality: N/A;        Home Medications    Prior to Admission medications   Medication Sig Start Date End Date Taking? Authorizing Provider  ALPRAZolam Duanne Moron) 0.5 MG tablet Take 1 tablet (0.5 mg total) by mouth 2 (two) times daily as needed for anxiety. TAKE 1 TABLET BY MOUTH THREE TIMES DAILY AS NEEDED FOR  ANXIETY (ANGER,  PANIC) Patient taking differently: Take 0.5 mg by mouth 2 (two) times daily as needed for anxiety.  12/18/17  Yes Cobos, Myer Peer, MD  ARIPiprazole (ABILIFY) 10 MG tablet Take 1 tablet (10 mg total) by mouth daily. 12/18/17 12/18/18 Yes Cobos, Myer Peer, MD  Cholecalciferol (VITAMIN D3) 2000 units TABS TAKE 1 TABLET BY MOUTH ONCE DAILY Patient taking differently: Take 2,000 Units by mouth daily.  10/10/17  Yes Eksir,  Miu, MD  Dulaglutide (TRULICITY) 6.01 UX/3.2TF SOPN Inject 1 pen into the skin once a week.   Yes [provider]  fentaNYL (DURAGESIC - DOSED MCG/HR) 75 MCG/HR Place 75 mcg onto the skin every 3 (three) days.    Yes [provider]  gabapentin (NEURONTIN) 600 MG tablet Take 2 tablets (1,200 mg total) by mouth 2 (two) times daily. 10/10/17 10/10/18 Yes Eksir,  Miu, MD  hydroxychloroquine (PLAQUENIL) 200 MG tablet Take 200 mg by mouth 2 (two) times daily.    Yes [provider]  Insulin Glargine (BASAGLAR KWIKPEN) 100 UNIT/ML SOPN Inject 40 Units into the skin 2 (two)  times daily. 08/05/17  Yes [provider]  insulin lispro (HUMALOG) 100 UNIT/ML injection Inject 15 Units into the skin 2 (two) times daily.    Yes [provider]  lamoTRIgine (LAMICTAL) 100 MG tablet Take 2 tablets (200 mg total) by mouth 2 (two) times daily. Must be seen prior to future refills.  Please call [phone] for an appt. 07/11/17  Yes Dennie Bible, NP  levothyroxine (SYNTHROID, LEVOTHROID) 25 MCG tablet Take 25 mcg by mouth daily before breakfast.    Yes [provider]  Methotrexate, PF, (RASUVO) 25 MG/0.5ML SOAJ Inject 25 mg into the skin every 7 (seven) days.   Yes [provider]  metoCLOPramide (REGLAN) 10 MG tablet Take 10 mg by mouth daily. 12/20/17  Yes [provider]  mirtazapine ([name]) 15 MG tablet Take 1 tablet (15 mg total) by mouth at bedtime. 12/18/17  Yes Cobos, Myer Peer, MD    Multiple Vitamins-Minerals (MULTIVITAMIN PO) Take 1 tablet by mouth daily.   Yes [provider]  omeprazole (PRILOSEC) 20 MG capsule Take 20 mg by mouth daily.    Yes [provider]  pregabalin (LYRICA) 100 MG capsule Take 100 mg by mouth See admin instructions. Take 1 capsule every morning and take 2 capsules every night   Yes [provider]  venlafaxine XR (EFFEXOR-XR) 150 MG 24 hr capsule Take 2 capsules (300 mg total) by mouth daily with breakfast. 11/03/17  Yes Hisada, Elie Goody, MD  atorvastatin (LIPITOR) 40 MG tablet Take 1 tablet (40 mg total) by mouth daily at 6 PM. Patient not taking: Reported on 01/01/2018 05/06/16   Pucilowska, Herma Ard B, MD  ondansetron (ZOFRAN-ODT) 4 MG disintegrating tablet Take 1 tablet (4 mg total) by mouth every 8 (eight) hours as needed for nausea or vomiting. Patient not taking: Reported on 01/01/2018 07/03/17   Vanessa Kick, MD    Family History Family History  Problem Relation Age of Onset  . Diabetes Mother   . Diabetes Father   . Hypertension Father   . Heart attack Father 64       died age 68  . Alcohol abuse Father     Social History Social History   Tobacco Use  . Smoking status: Current Every Day Smoker    Packs/day: 1.00    Years: 25.00    Pack years: 25.00    Types: Cigarettes  . Smokeless tobacco: Never Used  . Tobacco comment: Has cut back to 1 pack a day  Substance Use Topics  . Alcohol use: No  . Drug use: No     Allergies   Celebrex [celecoxib]; Hydrocodone; and Sulfa antibiotics   Review of Systems Review of Systems  Constitutional: Positive for diaphoresis. Negative for chills and fever.  HENT: Negative for ear pain and sore throat.   Eyes: Negative for pain and visual disturbance.  Respiratory: Positive for shortness of breath. Negative for cough.   Cardiovascular: Positive for chest pain. Negative for palpitations.  Gastrointestinal: Negative for abdominal pain, nausea and vomiting.   Genitourinary: Negative for dysuria and hematuria.  Musculoskeletal: Negative for arthralgias and back pain.  Skin: Negative for color change and rash.  Neurological: Negative for seizures and syncope.  All other systems reviewed and are negative.    Physical Exam Updated Vital Signs BP 115/64   Pulse 78   Temp 98.4 F (36.9 C) (Oral)   Resp 18   Ht 5\' 11"  (1.803 m)   Wt 103.6 kg Comment: scale a  SpO2 97%   BMI 31.86 kg/m   Physical Exam  Constitutional: He appears well-developed and well-nourished.  HENT:  Head: Normocephalic and atraumatic.  Eyes: Conjunctivae are normal.  Neck: Neck supple.  Cardiovascular: Normal rate, regular rhythm and intact distal pulses.  No murmur heard. Pulmonary/Chest: Effort normal. No respiratory distress. He has wheezes.  Abdominal: Soft. There is no tenderness.  Musculoskeletal:       Right lower leg: He exhibits edema (trace).       Left lower leg: He exhibits edema (trace).  Neurological: He is alert.  Skin: Skin is warm and dry.  Psychiatric: He has a normal mood and affect.  Nursing note and vitals reviewed.    ED Treatments / Results  Labs (all labs ordered are listed, but only abnormal results are displayed) Labs Reviewed  BASIC METABOLIC PANEL - Abnormal; Notable for the following components:      Result Value   Glucose, Bld 370 (*)    All other components within normal limits  CBC - Abnormal; Notable for the following components:   nRBC 0.3 (*)    All other components within normal limits  LIPID PANEL - Abnormal; Notable for the following components:   Cholesterol 362 (*)    Triglycerides 1,384 (*)    HDL 29 (*)    All other components within normal limits  GLUCOSE, CAPILLARY - Abnormal; Notable for the following components:   Glucose-Capillary 251 (*)    All other components within normal limits  GLUCOSE, CAPILLARY - Abnormal; Notable for the following components:   Glucose-Capillary 350 (*)    All other  components within normal limits  GLUCOSE, CAPILLARY - Abnormal; Notable for the following components:   Glucose-Capillary 263 (*)    All other components within normal limits  GLUCOSE, CAPILLARY - Abnormal; Notable for the following components:   Glucose-Capillary 112 (*)    All other components within normal limits  HIV ANTIBODY (ROUTINE TESTING W REFLEX)  TROPONIN I  TROPONIN I  TROPONIN I  CBC  BASIC METABOLIC PANEL  MAGNESIUM  I-STAT TROPONIN, ED    EKG EKG Interpretation  Date/Time:  Tuesday January 01 2018 14:39:41 EDT Ventricular Rate:  109 PR Interval:  150 QRS Duration: 86 QT Interval:  326 QTC Calculation: 439 R Axis:   64 Text Interpretation:  Sinus tachycardia Otherwise normal ECG faster rate otherwise no other changes from june Confirmed by Merrily Pew [phone]) on 01/01/2018 5:26:26 PM   Radiology Dg Chest 2 View  Result Date: 01/01/2018 CLINICAL DATA:  Chest pain EXAM: CHEST - 2 VIEW COMPARISON:  August 25, 2017 FINDINGS: There is no edema or consolidation. The heart size and pulmonary vascularity are normal. No adenopathy. There is evidence of old trauma involving the distal right clavicle. No pneumothorax. IMPRESSION: No edema or consolidation. Electronically Signed   By: Lowella Grip III M.D.   On: 01/01/2018 15:44      Procedures Procedures (including critical care time)  Medications Ordered in ED Medications  fentaNYL (DURAGESIC - dosed mcg/hr) 75 mcg (75 mcg Transdermal Patch Applied 01/02/18 0830)  hydroxychloroquine (PLAQUENIL) tablet 200 mg (200 mg Oral Given 01/02/18 0824)  ALPRAZolam (XANAX) tablet 0.5 mg (has no administration in time range)  ARIPiprazole (ABILIFY) tablet 10 mg (10 mg Oral Given 01/02/18 0823)  mirtazapine ([name]) tablet 15 mg (15 mg Oral Given 01/01/18 2156)  venlafaxine XR (EFFEXOR-XR) 24 hr capsule 300 mg (300 mg Oral Given 01/02/18 0825)  levothyroxine (SYNTHROID, LEVOTHROID) tablet 25 mcg (25  mcg Oral Given  01/02/18 0532)  metoCLOPramide (REGLAN) tablet 10 mg (10 mg Oral Given 01/02/18 0824)  pantoprazole (PROTONIX) EC tablet 40 mg (40 mg Oral Given 01/02/18 0825)  gabapentin (NEURONTIN) tablet 1,200 mg (1,200 mg Oral Given 01/02/18 0822)  lamoTRIgine (LAMICTAL) tablet 200 mg (200 mg Oral Given 01/02/18 0825)  pregabalin (LYRICA) capsule 100 mg (100 mg Oral Given 01/02/18 0825)  cholecalciferol (VITAMIN D) tablet 2,000 Units (2,000 Units Oral Given 01/02/18 0823)  atorvastatin (LIPITOR) tablet 40 mg (40 mg Oral Given 01/02/18 1803)  nitroGLYCERIN (NITROSTAT) SL tablet 0.4 mg (has no administration in time range)  acetaminophen (TYLENOL) tablet 650 mg (has no administration in time range)  ondansetron (ZOFRAN) injection 4 mg (has no administration in time range)  enoxaparin (LOVENOX) injection 40 mg (40 mg Subcutaneous Given 01/01/18 2156)  insulin aspart (novoLOG) injection 0-9 Units (0 Units Subcutaneous Not Given 01/02/18 1751)  insulin aspart (novoLOG) injection 0-5 Units (3 Units Subcutaneous Given 01/01/18 2209)  insulin aspart (novoLOG) injection 6 Units (6 Units Subcutaneous Given 01/02/18 1802)  metoprolol tartrate (LOPRESSOR) tablet 25 mg (25 mg Oral Given 01/02/18 0824)  0.9 %  sodium chloride infusion ( Intravenous Rate/Dose Verify 01/02/18 1347)  pregabalin (LYRICA) capsule 200 mg (200 mg Oral Given 01/01/18 2157)  fenofibrate tablet 160 mg (160 mg Oral Given 01/02/18 1057)  diltiazem (CARDIZEM) injection 5 mg (10 mg Intravenous Given 01/02/18 1546)  nicotine (NICODERM CQ - dosed in mg/24 hours) patch 21 mg (21 mg Transdermal Patch Applied 01/02/18 1504)  insulin glargine (LANTUS) injection 35 Units (has no administration in time range)  nicotine polacrilex (NICORETTE) gum 2 mg (has no administration in time range)  metoprolol tartrate (LOPRESSOR) tablet 50 mg (50 mg Oral Given 01/02/18 1056)  nitroGLYCERIN (NITROSTAT) SL tablet 0.8 mg (0.8 mg Sublingual Given 01/02/18 1550)    iopamidol (ISOVUE-370) 76 % injection 100 mL (100 mLs Intravenous Contrast Given 01/02/18 1603)     Initial Impression / Assessment and Plan / ED Course  I have reviewed the triage vital signs and the nursing notes.  Pertinent labs & imaging results that were available during my care of the patient were reviewed by me and considered in my medical decision making (see chart for details).     Patient is a 52 year old male with past medical history as detailed above who presents the emergency department for evaluation of chest pain that is been ongoing for approximately 1 month.  Patient's chest pain is exertional in nature, associated with dyspnea and diaphoresis, and has been getting gradually worse.  His physical exam is as detailed above and is remarkable only for mild wheezing and trace lower his labs are remarkable for elevated blood sugar without anion gap otherwise reassuring. EKG without signs of acute ischemia. Chest xray with no acute findings. Given patient's presentation I am concerned for unstable angina.  Given patient's personal and family history he would be high risk via hear score patient will be admitted to the hospital service for further cardiac work-up as an inpatient.  At this time, I believe patient's chest pain is most likely related to unstable angina.  Patient does not have a pleuritic component to his chest pain, no tachycardia, and no significant hypoxia while in the emergency department making PE less likely at this time.  Patient has no focal pneumonia on chest x-ray.  No evidence of pneumothorax on chest x-ray.  Patient with no recent illness or positional component to his pain to make me think of  pericarditis at this time.  No evidence of tamponade.  The care of this patient was discussed with my attending physician Dr. Dayna Barker, who voices agreement with work-up and ED disposition.   Final Clinical Impressions(s) / ED Diagnoses   Final diagnoses:  Nonspecific  chest pain    ED Discharge Orders    None       Avian Konigsberg, Chanda Busing, MD 01/02/18 2132    Merrily Pew, MD 01/02/18 2318

## 2018-01-01 NOTE — ED Notes (Signed)
Patient ambulatory to the room, no acute distress noted.  Pt getting changed into gown for full exam.

## 2018-01-01 NOTE — H&P (Signed)
History and Physical    Vincent [name] PVV:[id number] DOB: 10/28/1965 DOA: 01/01/2018  PCP: Leonard Downing, MD   Patient coming from: Home   Chief Complaint: Chest pain   HPI: Vincent [name] is a 52 y.o. male with medical history significant for hypertension, COPD, chronic pain, and bipolar disorder, now presenting to the emergency department for evaluation of chest pain.  Patient reports that he began to develop intermittent chest pain about 1 month ago.  He describes this as a pressure sensation, oftentimes worse with exertion and better with rest sometimes associated with mild dyspnea, sometimes radiating to the left arm.  He denies any recent fevers, chills, cough, leg swelling, or leg tenderness.  Reports that his glucose has been difficult to control.  Reports that his psychiatric issues are under fair control and denies any HI, SI, or hallucinations.  ED Course: Upon arrival to the ED, patient is found to be afebrile, saturating well on room air, and with vitals otherwise normal.  EKG features sinus tachycardia with rate 109 and chest x-ray is negative for edema or consolidation.  Chemistry panel is notable for glucose of 370 and CBC is unremarkable.  Troponin is negative.  Patient reports an allergy to aspirin.  He remains hemodynamically stable and free of any chest pain in the ED and will be observed for further evaluation at the recommendation of cardiology consultants.  Review of Systems:  All other systems reviewed and apart from HPI, are negative.  Past Medical History:  Diagnosis Date  . Anxiety   . Arthritis   . Bipolar 1 disorder ([place])   . Chronic pain syndrome    back  . COPD (chronic obstructive pulmonary disease) ([place])   . Gastroparesis   . GERD (gastroesophageal reflux disease)   . Hiatal hernia   . History of bladder cancer urologist-  dr Consuella Lose   papillay TCC (Ta G1)  s/p TURBT and chemo instillation 2014  . History of encephalopathy 05/27/2015   admission w/ acute encephalopathy thought to be secondary to pain meds and COPD  . History of gastric ulcer   . History of kidney stones   . History of TIA (transient ischemic attack) 2008    no residual  . History of traumatic head injury 2010   w/ LOC  per pt needed stitches  . Hyperlipidemia   . Hypothyroidism   . Insomnia    per sleep study 04-19-2015 without sleep apnea  . Neuropathy in diabetes ([place])    LOWER EXTREMITIES  . Poor historian   . Seizures, transient [facility]) neurologist-  dr Krista Blue--  differential dx complex partial seizure .vs.  mood disorder .vs.  pseudoseizure--  negative EEG's   confusion episodes and staring spells since 11/ 2015  . Transient confusion NEUOROLOGIST-  DR YAN   Episodes since 11/ 2015--  neurologist dx  differential complex partial seizure  .vs. mood disorder . vs. pseudoseizure  . Type 2 diabetes mellitus treated with insulin ([place])   . Urinary hesitancy     Past Surgical History:  Procedure Laterality Date  . CARDIAC CATHETERIZATION  12-27-2001  DR Einar Gip  &  05-26-2009  DR Irish Lack   RESULTS FOR BOTH ARE NORMAL CORONARIES AND PERSERVED LVF/ EF 60%  . CARPAL TUNNEL RELEASE Right 09-16-2003  . CARPAL TUNNEL RELEASE Left 02/25/2015   Procedure: LEFT CARPAL TUNNEL RELEASE;  Surgeon: Leanora Cover, MD;  Location: [place];  Service: Orthopedics;  Laterality: Left;  . CYSTOSCOPY N/A 10/10/2012  Procedure: CYSTOSCOPY CLOT EVACUATION FULGERATION OF BLEEDERS ;  Surgeon: Claybon Jabs, MD;  Location[facility];  Service: Urology;  Laterality: N/A;  . CYSTOSCOPY WITH BIOPSY N/A 11/26/2015   Procedure: CYSTOSCOPY WITH BIOPSY AND FULGURATION;  Surgeon: Kathie Rhodes, MD;  Location: [place];  Service: Urology;  Laterality: N/A;  . LAPAROSCOPIC CHOLECYSTECTOMY  11-17-2010  . NEGATIVE SLEEP STUDY  04-19-2015  in epic  . ORCHIECTOMY Right 02/21/2016   Procedure: SCROTAL ORCHIECTOMY with TESTICULAR PROSTHESIS  IMPLANT;  Surgeon: Kathie Rhodes, MD;  Location: [facility];  Service: Urology;  Laterality: Right;  . ROTATOR CUFF REPAIR Right 12/2004  . TRANSURETHRAL RESECTION OF BLADDER TUMOR N/A 08/09/2012   Procedure: TRANSURETHRAL RESECTION OF BLADDER TUMOR (TURBT) WITH GYRUS WITH MITOMYCIN C;  Surgeon: Claybon Jabs, MD;  Location: [facility];  Service: Urology;  Laterality: N/A;  . TRANSURETHRAL RESECTION OF BLADDER TUMOR WITH GYRUS (TURBT-GYRUS) N/A 02/27/2014   Procedure: TRANSURETHRAL RESECTION OF BLADDER TUMOR WITH GYRUS (TURBT-GYRUS);  Surgeon: Claybon Jabs, MD;  Location: [facility];  Service: Urology;  Laterality: N/A;     reports that he has been smoking cigarettes. He has a 25.00 pack-year smoking history. He has never used smokeless tobacco. He reports that he does not drink alcohol or use drugs.  Allergies  Allergen Reactions  . Celebrex [Celecoxib] Anaphylaxis and Rash  . Hydrocodone Rash and Other (See Comments)    "blisters developed on arms"  . Sulfa Antibiotics Rash    Family History  Problem Relation Age of Onset  . Diabetes Mother   . Heart attack Mother   . Diabetes Father   . Hypertension Father   . Heart attack Father   . Alcohol abuse Father      Prior to Admission medications   Medication Sig Start Date End Date Taking? Authorizing Provider  ALPRAZolam Duanne Moron) 0.5 MG tablet Take 1 tablet (0.5 mg total) by mouth 2 (two) times daily as needed for anxiety. TAKE 1 TABLET BY MOUTH THREE TIMES DAILY AS NEEDED FOR ANXIETY (ANGER,  PANIC) Patient taking differently: Take 0.5 mg by mouth 2 (two) times daily as needed for anxiety.  12/18/17  Yes Cobos, Myer Peer, MD  ARIPiprazole (ABILIFY) 10 MG tablet Take 1 tablet (10 mg total) by mouth daily. 12/18/17 12/18/18 Yes Cobos, Myer Peer, MD  Cholecalciferol (VITAMIN D3) 2000 units TABS TAKE 1 TABLET BY MOUTH ONCE DAILY Patient taking differently: Take 2,000 Units by mouth daily.   10/10/17  Yes Eksir, Richard Miu, MD  Dulaglutide (TRULICITY) 7.90 WI/0.9BD SOPN Inject 1 pen into the skin once a week.   Yes [provider]  fentaNYL (DURAGESIC - DOSED MCG/HR) 75 MCG/HR Place 75 mcg onto the skin every 3 (three) days.    Yes [provider]  gabapentin (NEURONTIN) 600 MG tablet Take 2 tablets (1,200 mg total) by mouth 2 (two) times daily. 10/10/17 10/10/18 Yes Eksir, Richard Miu, MD  hydroxychloroquine (PLAQUENIL) 200 MG tablet Take 200 mg by mouth 2 (two) times daily.    Yes [provider]  Insulin Glargine (BASAGLAR KWIKPEN) 100 UNIT/ML SOPN Inject 40 Units into the skin 2 (two) times daily. 08/05/17  Yes [provider]  insulin lispro (HUMALOG) 100 UNIT/ML injection Inject 15 Units into the skin 2 (two) times daily.    Yes [provider]  lamoTRIgine (LAMICTAL) 100 MG tablet Take 2 tablets (200 mg total) by mouth 2 (two) times daily. Must  be seen prior to future refills.  Please call [phone] for an appt. 07/11/17  Yes Dennie Bible, NP  levothyroxine (SYNTHROID, LEVOTHROID) 25 MCG tablet Take 25 mcg by mouth daily before breakfast.    Yes [provider]  Methotrexate, PF, (RASUVO) 25 MG/0.5ML SOAJ Inject 25 mg into the skin every 7 (seven) days.   Yes [provider]  metoCLOPramide (REGLAN) 10 MG tablet Take 10 mg by mouth daily. 12/20/17  Yes [provider]  mirtazapine ([name]) 15 MG tablet Take 1 tablet (15 mg total) by mouth at bedtime. 12/18/17  Yes Cobos, Myer Peer, MD  Multiple Vitamins-Minerals (MULTIVITAMIN PO) Take 1 tablet by mouth daily.   Yes [provider]  omeprazole (PRILOSEC) 20 MG capsule Take 20 mg by mouth daily.    Yes [provider]  pregabalin (LYRICA) 100 MG capsule Take 100 mg by mouth See admin instructions. Take 1 capsule every morning and take 2 capsules every night   Yes [provider]  venlafaxine XR (EFFEXOR-XR) 150 MG 24 hr  capsule Take 2 capsules (300 mg total) by mouth daily with breakfast. 11/03/17  Yes Hisada, Elie Goody, MD  atorvastatin (LIPITOR) 40 MG tablet Take 1 tablet (40 mg total) by mouth daily at 6 PM. Patient not taking: Reported on 01/01/2018 05/06/16   Pucilowska, Herma Ard B, MD  ondansetron (ZOFRAN-ODT) 4 MG disintegrating tablet Take 1 tablet (4 mg total) by mouth every 8 (eight) hours as needed for nausea or vomiting. Patient not taking: Reported on 01/01/2018 07/03/17   Vanessa Kick, MD    Physical Exam: Vitals:   01/01/18 1845 01/01/18 1900 01/01/18 1912 01/01/18 1915  BP: 129/74 140/90  112/82  Pulse: 92 94  94  Resp: 17 16  15   Temp:      TempSrc:      SpO2: 92% 91% 93% 97%     Constitutional: NAD, calm  Eyes: PERTLA, lids and conjunctivae normal ENMT: Mucous membranes are moist. Posterior pharynx clear of any exudate or lesions.   Neck: normal, supple, no masses, no thyromegaly Respiratory: Wheezes. No rhonchi or crackles. No accessory muscle use.  Cardiovascular: S1 & S2 heard, regular rate and rhythm. No extremity edema.  Abdomen: No distension, no tenderness, soft. Bowel sounds normal.  Musculoskeletal: no clubbing / cyanosis. No joint deformity upper and lower extremities.   Skin: no significant rashes, lesions, ulcers. Warm, dry, well-perfused. Neurologic: CN 2-12 grossly intact. Sensation intact. Strength 5/5 in all 4 limbs.  Psychiatric:  Alert and oriented x 3. Pleasant and cooperative.    Labs on Admission: I have personally reviewed following labs and imaging studies  CBC: Recent Labs  Lab 01/01/18 1453  WBC 7.4  HGB 16.5  HCT 47.0  MCV 86.4  PLT 470   Basic Metabolic Panel: Recent Labs  Lab 01/01/18 1453  NA 137  K 4.1  CL 102  CO2 22  GLUCOSE 370*  BUN 14  CREATININE 1.09  CALCIUM 9.5   GFR: CrCl cannot be calculated (Unknown ideal weight.). Liver Function Tests: No results for input(s): AST, ALT, ALKPHOS, BILITOT, PROT, ALBUMIN in the last 168  hours. No results for input(s): LIPASE, AMYLASE in the last 168 hours. No results for input(s): AMMONIA in the last 168 hours. Coagulation Profile: No results for input(s): INR, PROTIME in the last 168 hours. Cardiac Enzymes: No results for input(s): CKTOTAL, CKMB, CKMBINDEX, TROPONINI in the last 168 hours. BNP (last 3 results) No results for input(s): PROBNP in the last  8760 hours. HbA1C: No results for input(s): HGBA1C in the last 72 hours. CBG: No results for input(s): GLUCAP in the last 168 hours. Lipid Profile: No results for input(s): CHOL, HDL, LDLCALC, TRIG, CHOLHDL, LDLDIRECT in the last 72 hours. Thyroid Function Tests: No results for input(s): TSH, T4TOTAL, FREET4, T3FREE, THYROIDAB in the last 72 hours. Anemia Panel: No results for input(s): VITAMINB12, FOLATE, FERRITIN, TIBC, IRON, RETICCTPCT in the last 72 hours. Urine analysis:    Component Value Date/Time   COLORURINE YELLOW 05/17/2017 0234   APPEARANCEUR CLEAR 05/17/2017 0234   LABSPEC 1.031 (H) 05/17/2017 0234   PHURINE 5.0 05/17/2017 0234   GLUCOSEU >=500 (A) 05/17/2017 0234   HGBUR NEGATIVE 05/17/2017 0234   BILIRUBINUR NEGATIVE 05/17/2017 0234   KETONESUR NEGATIVE 05/17/2017 0234   PROTEINUR 30 (A) 05/17/2017 0234   UROBILINOGEN 1.0 03/04/2014 2312   NITRITE NEGATIVE 05/17/2017 0234   LEUKOCYTESUR NEGATIVE 05/17/2017 0234   Sepsis Labs: @LABRCNTIP (procalcitonin:4,lacticidven:4) )No results found for this or any previous visit (from the past 240 hour(s)).   Radiological Exams on Admission: Dg Chest 2 View  Result Date: 01/01/2018 CLINICAL DATA:  Chest pain EXAM: CHEST - 2 VIEW COMPARISON:  August 25, 2017 FINDINGS: There is no edema or consolidation. The heart size and pulmonary vascularity are normal. No adenopathy. There is evidence of old trauma involving the distal right clavicle. No pneumothorax. IMPRESSION: No edema or consolidation. Electronically Signed   By: Lowella Grip III M.D.   On:  01/01/2018 15:44    EKG: Independently reviewed. Sinus tachycardia (rate 109).   Assessment/Plan   1. Chest pain  - Presents with intermittent chest pain, sometimes worse with exertion  - EKG without appreciable ischemic features, CXR unremarkable, initial troponin negative, no evidence for VTE - Cardiology is consulting and much appreciated  - Plan for continued cardiac monitoring, serial troponin measurements, statin, beta-blocker, and coronary CT    2. Insulin-dependent DM  - A1c was 9.1% earlier this month  - Managed at home with Lantus 40 units BID and Humalog 15 units BID  - Check CBG's, continue glycemic-control with Lantus and Novolog while in hospital    3. Bipolar disorder  - Stable, follows closely with behavioral health  - Continue Current management with Abilify, Effexor, Lamictal, [name], and as-needed Xanax    4. Chronic pain; rheumatoid arthritis   - No pain complaints on admission  - Continue current management with Plaquenil, fentanyl patch, and gabapentin    5. Hypothyroidism  - Continue Synthroid    6. COPD - Wheezing noted but patient denies SOB or cough - Continue albuterol as-needed    DVT prophylaxis: Lovenox Code Status: Full  Family Communication: Wife updated at bedside Consults called: Cardiology consulted by ED physician  Admission status: Observation     Vianne Bulls, MD [facility] Pager [phone]  If 7PM-7AM, please contact night-coverage [email or web address] Password [facility]  01/01/2018, 7:45 PM

## 2018-01-02 ENCOUNTER — Encounter (HOSPITAL_COMMUNITY): Payer: Self-pay | Admitting: Psychology

## 2018-01-02 ENCOUNTER — Observation Stay (HOSPITAL_COMMUNITY): Payer: 59

## 2018-01-02 ENCOUNTER — Ambulatory Visit (HOSPITAL_COMMUNITY): Payer: Self-pay | Admitting: Psychology

## 2018-01-02 DIAGNOSIS — R072 Precordial pain: Secondary | ICD-10-CM | POA: Diagnosis not present

## 2018-01-02 DIAGNOSIS — R079 Chest pain, unspecified: Secondary | ICD-10-CM | POA: Diagnosis not present

## 2018-01-02 DIAGNOSIS — F3181 Bipolar II disorder: Secondary | ICD-10-CM | POA: Diagnosis not present

## 2018-01-02 DIAGNOSIS — K219 Gastro-esophageal reflux disease without esophagitis: Secondary | ICD-10-CM | POA: Diagnosis not present

## 2018-01-02 DIAGNOSIS — E785 Hyperlipidemia, unspecified: Secondary | ICD-10-CM | POA: Diagnosis not present

## 2018-01-02 DIAGNOSIS — J449 Chronic obstructive pulmonary disease, unspecified: Secondary | ICD-10-CM | POA: Diagnosis not present

## 2018-01-02 DIAGNOSIS — E1143 Type 2 diabetes mellitus with diabetic autonomic (poly)neuropathy: Secondary | ICD-10-CM | POA: Diagnosis not present

## 2018-01-02 DIAGNOSIS — F17210 Nicotine dependence, cigarettes, uncomplicated: Secondary | ICD-10-CM | POA: Diagnosis not present

## 2018-01-02 DIAGNOSIS — G4700 Insomnia, unspecified: Secondary | ICD-10-CM | POA: Diagnosis not present

## 2018-01-02 DIAGNOSIS — R0789 Other chest pain: Secondary | ICD-10-CM | POA: Diagnosis not present

## 2018-01-02 DIAGNOSIS — I2510 Atherosclerotic heart disease of native coronary artery without angina pectoris: Secondary | ICD-10-CM | POA: Diagnosis not present

## 2018-01-02 DIAGNOSIS — E039 Hypothyroidism, unspecified: Secondary | ICD-10-CM | POA: Diagnosis not present

## 2018-01-02 DIAGNOSIS — I10 Essential (primary) hypertension: Secondary | ICD-10-CM | POA: Diagnosis not present

## 2018-01-02 DIAGNOSIS — E1165 Type 2 diabetes mellitus with hyperglycemia: Secondary | ICD-10-CM | POA: Diagnosis not present

## 2018-01-02 LAB — GLUCOSE, CAPILLARY
Glucose-Capillary: 350 mg/dL — ABNORMAL HIGH (ref 70–99)
Glucose-Capillary: 263 mg/dL — ABNORMAL HIGH (ref 70–99)
Glucose-Capillary: 112 mg/dL — ABNORMAL HIGH (ref 70–99)
Glucose-Capillary: 311 mg/dL — ABNORMAL HIGH (ref 70–99)

## 2018-01-02 LAB — TROPONIN I
Troponin I: <0.03 ng/mL (ref <0.03)
Troponin I: <0.03 ng/mL (ref <0.03)

## 2018-01-02 LAB — LIPID PANEL
Cholesterol: 362 mg/dL — ABNORMAL HIGH (ref 0–200)
HDL: 29 mg/dL — ABNORMAL LOW (ref >40)
LDL Cholesterol: UNDETERMINED mg/dL (ref 0–99)
Triglycerides: 1384 mg/dL — ABNORMAL HIGH (ref <150)
VLDL: UNDETERMINED mg/dL (ref 0–40)

## 2018-01-02 LAB — HIV ANTIBODY (ROUTINE TESTING W REFLEX): HIV Screen 4th Generation wRfx: NONREACTIVE

## 2018-01-02 MED ORDER — IOPAMIDOL (ISOVUE-370) INJECTION 76%
100.0000 mL | Freq: Once | INTRAVENOUS | Status: AC | PRN
  Administered 2018-01-02: 100 mL via INTRAVENOUS

## 2018-01-02 MED ORDER — DILTIAZEM HCL 25 MG/5ML IV SOLN
INTRAVENOUS | Status: AC
  Administered 2018-01-02: 5 mg via INTRAVENOUS
  Filled 2018-01-02: qty 5

## 2018-01-02 MED ORDER — FENOFIBRATE 160 MG PO TABS
160.0000 mg | ORAL_TABLET | Freq: Every day | ORAL | Status: DC
  Administered 2018-01-02 – 2018-01-03 (×2): 160 mg via ORAL
  Filled 2018-01-02 (×2): qty 1

## 2018-01-02 MED ORDER — NICOTINE POLACRILEX 2 MG MT GUM
2.0000 mg | CHEWING_GUM | OROMUCOSAL | Status: DC | PRN
  Filled 2018-01-02: qty 1

## 2018-01-02 MED ORDER — DILTIAZEM HCL 25 MG/5ML IV SOLN
5.0000 mg | INTRAVENOUS | Status: DC | PRN
  Administered 2018-01-02: 10 mg via INTRAVENOUS
  Administered 2018-01-02 (×2): 5 mg via INTRAVENOUS
  Filled 2018-01-02 (×4): qty 5

## 2018-01-02 MED ORDER — NITROGLYCERIN 0.4 MG SL SUBL
SUBLINGUAL_TABLET | SUBLINGUAL | Status: AC
  Administered 2018-01-02: 0.8 mg via SUBLINGUAL
  Filled 2018-01-02: qty 2

## 2018-01-02 MED ORDER — INSULIN GLARGINE 100 UNIT/ML ~~LOC~~ SOLN
35.0000 [IU] | Freq: Two times a day (BID) | SUBCUTANEOUS | Status: DC
  Administered 2018-01-03 (×2): 35 [IU] via SUBCUTANEOUS
  Filled 2018-01-02 (×3): qty 0.35

## 2018-01-02 MED ORDER — METOPROLOL TARTRATE 50 MG PO TABS
50.0000 mg | ORAL_TABLET | Freq: Once | ORAL | Status: AC
  Administered 2018-01-02: 50 mg via ORAL
  Filled 2018-01-02: qty 1

## 2018-01-02 MED ORDER — NITROGLYCERIN 0.4 MG SL SUBL
0.8000 mg | SUBLINGUAL_TABLET | Freq: Once | SUBLINGUAL | Status: AC
  Administered 2018-01-02: 0.8 mg via SUBLINGUAL

## 2018-01-02 MED ORDER — NICOTINE 21 MG/24HR TD PT24
21.0000 mg | MEDICATED_PATCH | Freq: Every day | TRANSDERMAL | Status: DC
  Administered 2018-01-02 – 2018-01-03 (×2): 21 mg via TRANSDERMAL
  Filled 2018-01-02 (×2): qty 1

## 2018-01-02 NOTE — Progress Notes (Signed)
Progress Note  Patient Name: Vincent [name] [facility] Date of Encounter: 01/02/2018  Primary Cardiologist: Minus Breeding, MD    Subjective   No complaints IV for cardiac CT is in   Inpatient Medications    Scheduled Meds: . ARIPiprazole  10 mg Oral Daily  . atorvastatin  40 mg Oral q1800  . cholecalciferol  2,000 Units Oral Daily  . enoxaparin (LOVENOX) injection  40 mg Subcutaneous Q24H  . fentaNYL  75 mcg Transdermal Q72H  . gabapentin  1,200 mg Oral BID  . hydroxychloroquine  200 mg Oral BID  . insulin aspart  0-5 Units Subcutaneous QHS  . insulin aspart  0-9 Units Subcutaneous TID WC  . insulin aspart  6 Units Subcutaneous TID WC  . insulin glargine  25 Units Subcutaneous BID  . lamoTRIgine  200 mg Oral BID  . levothyroxine  25 mcg Oral Q0600  . metoCLOPramide  10 mg Oral Daily  . metoprolol tartrate  25 mg Oral BID  . metoprolol tartrate  50 mg Oral Once  . mirtazapine  15 mg Oral QHS  . pantoprazole  40 mg Oral Daily  . pregabalin  100 mg Oral Daily  . pregabalin  200 mg Oral QHS  . venlafaxine XR  300 mg Oral Q breakfast   Continuous Infusions: . sodium chloride 50 mL/hr at 01/01/18 2309   PRN Meds: acetaminophen, ALPRAZolam, nitroGLYCERIN, ondansetron (ZOFRAN) IV   Vital Signs    Vitals:   01/01/18 2038 01/01/18 2117 01/02/18 0530 01/02/18 0821  BP: 137/83  125/82 (!) 146/77  Pulse: 94  85 79  Resp: 20  20   Temp: 98 F (36.7 C)  98.8 F (37.1 C)   TempSrc: Oral  Oral   SpO2: 96%  97% 100%  Weight:  102.9 kg 103.6 kg   Height:  5\' 11"  (1.803 m)      Intake/Output Summary (Last 24 hours) at 01/02/2018 0923 Last data filed at 01/02/2018 8937 Gross per 24 hour  Intake 530.34 ml  Output 600 ml  Net -69.66 ml   Filed Weights   01/01/18 2117 01/02/18 0530  Weight: 102.9 kg 103.6 kg    Telemetry    NSR  - Personally Reviewed  ECG    NSR no acute changes  - Personally Reviewed  Physical Exam   GEN: No acute distress.   Neck: No  JVD Cardiac: RRR, no murmurs, rubs, or gallops.  Respiratory: Clear to auscultation bilaterally. GI: Soft, nontender, non-distended  MS: No edema; No deformity. Neuro:  Nonfocal  Psych: Normal affect   Labs    Chemistry Recent Labs  Lab 01/01/18 1453  NA 137  K 4.1  CL 102  CO2 22  GLUCOSE 370*  BUN 14  CREATININE 1.09  CALCIUM 9.5  GFRNONAA >60  GFRAA >60  ANIONGAP 13     Hematology Recent Labs  Lab 01/01/18 1453  WBC 7.4  RBC 5.44  HGB 16.5  HCT 47.0  MCV 86.4  MCH 30.3  MCHC 35.1  RDW 12.2  PLT 240    Cardiac Enzymes Recent Labs  Lab 01/01/18 2106 01/02/18 0238 01/02/18 0712  TROPONINI <0.03 <0.03 <0.03    Recent Labs  Lab 01/01/18 1459  TROPIPOC 0.00     BNPNo results for input(s): BNP, PROBNP in the last 168 hours.   DDimer No results for input(s): DDIMER in the last 168 hours.   Radiology    Dg Chest 2 View  Result Date: 01/01/2018 CLINICAL DATA:  Chest pain EXAM: CHEST - 2 VIEW COMPARISON:  August 25, 2017 FINDINGS: There is no edema or consolidation. The heart size and pulmonary vascularity are normal. No adenopathy. There is evidence of old trauma involving the distal right clavicle. No pneumothorax. IMPRESSION: No edema or consolidation. Electronically Signed   By: Lowella Grip III M.D.   On: 01/01/2018 15:44    Cardiac Studies   None  Patient Profile     52 y.o. male chest pain. Previous cath 2003/2001 normal CRF;s poorly controlled DM , smoking And HLD for cardiac CTA 01/02/18  Assessment & Plan    Chest Pain: risk factors r/o atypical presentation. IV for cardiac CT is in Called CT and they Are aware of IP to be done Dr Meda Coffee to read.  HLD:  Triglycerides are over 1000 start fibrates f/u outpatient with Dr Darron Doom   DM:  macular degeneration      For questions or updates, please contact [facility] Please consult [email or web address] for contact info under        Signed, Jenkins Rouge, MD  01/02/2018, 9:23 AM

## 2018-01-02 NOTE — Progress Notes (Signed)
Vincent [name] is a 51 y.o. male patient who didn't show for appointment.  Looking at pt chart he presented in ED 01/01/18 for chest pains and appears he was admitted to [place], [facility]

## 2018-01-02 NOTE — Progress Notes (Signed)
PROGRESS NOTE    Vincent [name]  [name] DOB: 07/27/1965 DOA: 01/01/2018 PCP: Leonard Downing, MD   Brief Narrative:  Vincent [name] is Vincent [name] 52 y.o. male with medical history significant for hypertension, COPD, chronic pain, and bipolar disorder, now presenting to the emergency department for evaluation of chest pain.  Patient reports that he began to develop intermittent chest pain about 1 month ago.  He describes this as Vincent [name] pressure sensation, oftentimes worse with exertion and better with rest sometimes associated with mild dyspnea, sometimes radiating to the left arm.  He denies any recent fevers, chills, cough, leg swelling, or leg tenderness.  Reports that his glucose has been difficult to control.  Reports that his psychiatric issues are under fair control and denies any HI, SI, or hallucinations.  Assessment & Plan:   Principal Problem:   Chest pain Active Problems:   Type 2 diabetes mellitus with hyperglycemia (HCC)   Hypothyroidism   Bipolar 2 disorder (HCC)   History of rheumatoid arthritis   Chronic pain   1. Chest pain  - Presents with intermittent chest pain, sometimes worse with exertion  - EKG without appreciable ischemic features, CXR unremarkable, serial troponins negative, no evidence for VTE - Cardiology is consulting and much appreciated  - Plan for continued cardiac monitoring, serial troponin measurements, statin, beta-blocker - appreciate cardiology assistance -> Coronary CT with FFR pending  # Dyslipidemia  Hypertriglyceridemia: started on fibrate today by cards, follow outpatient  2. Insulin-dependent DM  - A1c was 9.1% earlier this month  - High fasting BG this AM, will increase lantus to 35 units BID and continue aspart TIDAC (Managed at home with Lantus 40 units BID and Humalog 15 units BID) - Check CBG's, continue glycemic-control with Lantus and Novolog while in hospital    3. Bipolar disorder  - Stable, follows closely with behavioral  health  - Continue Current management with Abilify, Effexor, Lamictal, [name], and as-needed Xanax    4. Chronic pain; rheumatoid arthritis   - No pain complaints on admission  - Continue current management with Plaquenil, fentanyl patch, and gabapentin    5. Hypothyroidism  - Continue Synthroid    6. COPD - No wheezing on exam - Continue albuterol as-needed   DVT prophylaxis: lovenox Code Status: full  Family Communication: wife at bedside Disposition Plan: pending cardiology sign off, final results of coronary CT   Consultants:   cardiology  Procedures:   none  Antimicrobials:  Anti-infectives (From admission, onward)   Start     Dose/Rate Route Frequency Ordered Stop   01/01/18 2200  hydroxychloroquine (PLAQUENIL) tablet 200 mg     200 mg Oral 2 times daily 01/01/18 1944           Subjective: Denies CP at present. Over past month has had substernal dull CP, worse with activity, better with rest.  Has not had nitro.   Objective: Vitals:   01/02/18 1538 01/02/18 1549 01/02/18 1601 01/02/18 1614  BP: 122/86 116/81 103/63 114/76  Pulse: 75 76 75 79  Resp:      Temp:    (!) 97.4 F (36.3 C)  TempSrc:    Oral  SpO2:    98%  Weight:      Height:        Intake/Output Summary (Last 24 hours) at 01/02/2018 1759 Last data filed at 01/02/2018 1347 Gross per 24 hour  Intake 790.34 ml  Output 1050 ml  Net -259.66 ml   [employer]  01/01/18 2117 01/02/18 0530  Weight: 102.9 kg 103.6 kg    Examination:  General exam: Appears calm and comfortable  Respiratory system: Clear to auscultation. Respiratory effort normal. Cardiovascular system: S1 & S2 heard, RRR. Gastrointestinal system: Abdomen is nondistended, soft and nontender.  Central nervous system: Alert and oriented. No focal neurological deficits. Extremities: Symmetric 5 x 5 power. Skin: No rashes, lesions or ulcers Psychiatry: Judgement and insight appear normal. Mood & affect appropriate.       Data Reviewed: I have personally reviewed following labs and imaging studies  CBC: Recent Labs  Lab 01/01/18 1453  WBC 7.4  HGB 16.5  HCT 47.0  MCV 86.4  PLT 093   Basic Metabolic Panel: Recent Labs  Lab 01/01/18 1453  NA 137  K 4.1  CL 102  CO2 22  GLUCOSE 370*  BUN 14  CREATININE 1.09  CALCIUM 9.5   GFR: Estimated Creatinine Clearance: 99.3 mL/min (by C-G formula based on SCr of 1.09 mg/dL). Liver Function Tests: No results for input(s): AST, ALT, ALKPHOS, BILITOT, PROT, ALBUMIN in the last 168 hours. No results for input(s): LIPASE, AMYLASE in the last 168 hours. No results for input(s): AMMONIA in the last 168 hours. Coagulation Profile: No results for input(s): INR, PROTIME in the last 168 hours. Cardiac Enzymes: Recent Labs  Lab 01/01/18 2106 01/02/18 0238 01/02/18 0712  TROPONINI <0.03 <0.03 <0.03   BNP (last 3 results) No results for input(s): PROBNP in the last 8760 hours. HbA1C: No results for input(s): HGBA1C in the last 72 hours. CBG: Recent Labs  Lab 01/01/18 2143 01/02/18 0729 01/02/18 1135 01/02/18 1627  GLUCAP 251* 350* 263* 112*   Lipid Profile: Recent Labs    01/02/18 0238  CHOL 362*  HDL 29*  LDLCALC UNABLE TO CALCULATE IF TRIGLYCERIDE OVER 400 mg/dL  TRIG 1,384*  CHOLHDL NOT REPORTED DUE TO HIGH TRIGLYCERIDES   Thyroid Function Tests: No results for input(s): TSH, T4TOTAL, FREET4, T3FREE, THYROIDAB in the last 72 hours. Anemia Panel: No results for input(s): VITAMINB12, FOLATE, FERRITIN, TIBC, IRON, RETICCTPCT in the last 72 hours. Sepsis Labs: No results for input(s): PROCALCITON, LATICACIDVEN in the last 168 hours.  No results found for this or any previous visit (from the past 240 hour(s)).       Radiology Studies: Dg Chest 2 View  Result Date: 01/01/2018 CLINICAL DATA:  Chest pain EXAM: CHEST - 2 VIEW COMPARISON:  August 25, 2017 FINDINGS: There is no edema or consolidation. The heart size and pulmonary  vascularity are normal. No adenopathy. There is evidence of old trauma involving the distal right clavicle. No pneumothorax. IMPRESSION: No edema or consolidation. Electronically Signed   By: Lowella Grip III M.D.   On: 01/01/2018 15:44   Ct Coronary Morph W/cta Cor W/score W/ca W/cm &/or Wo/cm  Addendum Date: 01/02/2018   ADDENDUM REPORT: 01/02/2018 17:47 CLINICAL DATA:  52 year old male with h/o DM, hyperlipidemia, smoking and chest pain. EXAM: Cardiac/Coronary  CT TECHNIQUE: The patient was scanned on [employer]. FINDINGS: Alizay Bronkema 120 kV prospective scan was triggered in the descending thoracic aorta at 111 HU's. Axial non-contrast 3 mm slices were carried out through the heart. The data set was analyzed on [employer] dedicated work station and scored using the [place]. Gantry rotation speed was 250 msecs and collimation was .6 mm. No beta blockade and 0.8 mg of sl NTG was given. The 3D data set was reconstructed in 5% intervals of the 67-82 % of the R-R cycle.  Diastolic phases were analyzed on [employer] dedicated work station using MPR, MIP and VRT modes. The patient received 80 cc of contrast. Aorta: Normal size. Mild diffuse calcifications and atheroma. No dissection. Aortic Valve:  Trileaflet.  No calcifications. Coronary Arteries:  Normal coronary origin.  Right dominance. RCA is Kynzli Rease large dominant artery that gives rise to PDA and PLVB. There mild calcified plaque in the proximal RCA associated with 25-50% stenosis. This is followed by Geetika Laborde moderate mixed, predominantly non-calcified plaque with stenosis 50-69%. Mid RCA has another focal moderate mixed plaque with stenosis 50-60%. Distal RCA has only minimal plaque. RCA has diffuse, mild mostly calcified plaque with stenosis 25-50%. PLA is Bernetha Anschutz large artery with minimal plaque. Left main is Darvis Croft large and long artery that gives rise to LAD, LCX arteries and Perel Hauschild very small ramus intermedius. Left main has no plaque. LAD is Bo Rogue large vessel that gives rise to Jahanna Raether  large first diagonal artery and has minimal irregularities. LCX is Plato Alspaugh small caliber non-dominant artery that gives rise to one small OM1 branch and has minimal plaque. Other findings: Normal pulmonary vein drainage into the left atrium. Normal let atrial appendage without Samani Deal thrombus. IMPRESSION: 1. Coronary calcium score of 164. This was 44 percentile for age and sex matched control. 2. Normal coronary origin with right dominance. 3. Moderate CAD in the proximal and mid RCA. Additional analysis with CT FFR will be submitted. 4. Dilated pulmonary artery measuring 34 mm suggestive of pulmonary hypertension. Electronically Signed   By: Ena Dawley   On: 01/02/2018 17:47   Result Date: 01/02/2018 EXAM: OVER-READ INTERPRETATION  CT CHEST The following report is an over-read performed by radiologist Dr. Markus Daft of [facility], [place] on 01/02/2018. This over-read does not include interpretation of cardiac or coronary anatomy or pathology. The coronary calcium score/coronary CTA interpretation by the cardiologist is attached. COMPARISON:  10/13/2010 FINDINGS: Vascular: Normal caliber of the visualized thoracic aorta. No significant pericardial fluid. Pulmonary arteries are not well opacified on this examination. Heart size is within normal limits. Minimal wall calcifications in the descending thoracic aorta. Mediastinum/Nodes: No significant lymph node enlargement in the visualized mediastinum and hilar regions. Lungs/Pleura: No large pleural effusions. There is no significant airspace disease or consolidation. Few densities along the posterior lower lobes are most compatible with atelectasis. Upper Abdomen: Images of the upper abdomen are unremarkable. Musculoskeletal: No acute abnormality. IMPRESSION: No acute abnormality.  Negative over-read exam. Electronically Signed: By: Markus Daft M.D. On: 01/02/2018 16:18        Scheduled Meds: . ARIPiprazole  10 mg Oral Daily  . atorvastatin  40 mg Oral  q1800  . cholecalciferol  2,000 Units Oral Daily  . enoxaparin (LOVENOX) injection  40 mg Subcutaneous Q24H  . fenofibrate  160 mg Oral Daily  . fentaNYL  75 mcg Transdermal Q72H  . gabapentin  1,200 mg Oral BID  . hydroxychloroquine  200 mg Oral BID  . insulin aspart  0-5 Units Subcutaneous QHS  . insulin aspart  0-9 Units Subcutaneous TID WC  . insulin aspart  6 Units Subcutaneous TID WC  . insulin glargine  25 Units Subcutaneous BID  . lamoTRIgine  200 mg Oral BID  . levothyroxine  25 mcg Oral Q0600  . metoCLOPramide  10 mg Oral Daily  . metoprolol tartrate  25 mg Oral BID  . mirtazapine  15 mg Oral QHS  . nicotine  21 mg Transdermal Daily  . pantoprazole  40 mg Oral Daily  .  pregabalin  100 mg Oral Daily  . pregabalin  200 mg Oral QHS  . venlafaxine XR  300 mg Oral Q breakfast   Continuous Infusions: . sodium chloride 50 mL/hr at 01/02/18 1347     LOS: 0 days    Time spent: over 30 min    Fayrene Helper, MD [facility] Pager [phone]  If 7PM-7AM, please contact night-coverage [email or web address] Password [email or web address] 01/02/2018, 5:59 PM

## 2018-01-03 DIAGNOSIS — K219 Gastro-esophageal reflux disease without esophagitis: Secondary | ICD-10-CM | POA: Diagnosis not present

## 2018-01-03 DIAGNOSIS — R072 Precordial pain: Secondary | ICD-10-CM | POA: Diagnosis not present

## 2018-01-03 DIAGNOSIS — I2510 Atherosclerotic heart disease of native coronary artery without angina pectoris: Secondary | ICD-10-CM | POA: Diagnosis not present

## 2018-01-03 DIAGNOSIS — E785 Hyperlipidemia, unspecified: Secondary | ICD-10-CM | POA: Diagnosis not present

## 2018-01-03 DIAGNOSIS — I10 Essential (primary) hypertension: Secondary | ICD-10-CM | POA: Diagnosis not present

## 2018-01-03 DIAGNOSIS — G4700 Insomnia, unspecified: Secondary | ICD-10-CM | POA: Diagnosis not present

## 2018-01-03 DIAGNOSIS — E039 Hypothyroidism, unspecified: Secondary | ICD-10-CM | POA: Diagnosis not present

## 2018-01-03 DIAGNOSIS — R0789 Other chest pain: Secondary | ICD-10-CM | POA: Diagnosis not present

## 2018-01-03 DIAGNOSIS — E1143 Type 2 diabetes mellitus with diabetic autonomic (poly)neuropathy: Secondary | ICD-10-CM | POA: Diagnosis not present

## 2018-01-03 DIAGNOSIS — F17210 Nicotine dependence, cigarettes, uncomplicated: Secondary | ICD-10-CM | POA: Diagnosis not present

## 2018-01-03 DIAGNOSIS — F3181 Bipolar II disorder: Secondary | ICD-10-CM | POA: Diagnosis not present

## 2018-01-03 DIAGNOSIS — J449 Chronic obstructive pulmonary disease, unspecified: Secondary | ICD-10-CM | POA: Diagnosis not present

## 2018-01-03 DIAGNOSIS — E1165 Type 2 diabetes mellitus with hyperglycemia: Secondary | ICD-10-CM | POA: Diagnosis not present

## 2018-01-03 LAB — BASIC METABOLIC PANEL
Anion gap: 7 (ref 5–15)
BUN: 11 mg/dL (ref 6–20)
CO2: 26 mmol/L (ref 22–32)
Calcium: 8.5 mg/dL — ABNORMAL LOW (ref 8.9–10.3)
Chloride: 105 mmol/L (ref 98–111)
Creatinine, Ser: 0.93 mg/dL (ref 0.61–1.24)
GFR calc Af Amer: >60 mL/min (ref >60)
GFR calc non Af Amer: >60 mL/min (ref >60)
Glucose, Bld: 189 mg/dL — ABNORMAL HIGH (ref 70–99)
Potassium: 3.9 mmol/L (ref 3.5–5.1)
Sodium: 138 mmol/L (ref 135–145)

## 2018-01-03 LAB — CBC
HCT: 44.6 % (ref 39.0–52.0)
Hemoglobin: 15.2 g/dL (ref 13.0–17.0)
MCH: 30.3 pg (ref 26.0–34.0)
MCHC: 34.1 g/dL (ref 30.0–36.0)
MCV: 88.8 fL (ref 80.0–100.0)
Platelets: 202 10*3/uL (ref 150–400)
RBC: 5.02 MIL/uL (ref 4.22–5.81)
RDW: 12.5 % (ref 11.5–15.5)
WBC: 8.2 10*3/uL (ref 4.0–10.5)
nRBC: 0 % (ref 0.0–0.2)

## 2018-01-03 LAB — MAGNESIUM: Magnesium: 2.2 mg/dL (ref 1.7–2.4)

## 2018-01-03 LAB — GLUCOSE, CAPILLARY: Glucose-Capillary: 238 mg/dL — ABNORMAL HIGH (ref 70–99)

## 2018-01-03 MED ORDER — ISOSORBIDE MONONITRATE ER 30 MG PO TB24: 15.0000 mg | ORAL_TABLET | Freq: Every day | ORAL | 0 refills | Status: DC

## 2018-01-03 MED ORDER — NICOTINE POLACRILEX 2 MG MT GUM: 2.0000 mg | CHEWING_GUM | OROMUCOSAL | 0 refills | Status: DC | PRN

## 2018-01-03 MED ORDER — ISOSORBIDE MONONITRATE ER 30 MG PO TB24
15.0000 mg | ORAL_TABLET | Freq: Every day | ORAL | Status: DC
  Administered 2018-01-03: 15 mg via ORAL
  Filled 2018-01-03: qty 1

## 2018-01-03 MED ORDER — METOPROLOL TARTRATE 25 MG PO TABS: 25.0000 mg | ORAL_TABLET | Freq: Two times a day (BID) | ORAL | 0 refills | Status: DC

## 2018-01-03 MED ORDER — FENOFIBRATE 160 MG PO TABS: 160.0000 mg | ORAL_TABLET | Freq: Every day | ORAL | 0 refills | Status: DC

## 2018-01-03 MED ORDER — NICOTINE 21 MG/24HR TD PT24: 21.0000 mg | MEDICATED_PATCH | Freq: Every day | TRANSDERMAL | 0 refills | Status: DC

## 2018-01-03 NOTE — Discharge Summary (Signed)
Physician Discharge Summary  La Dibella Mazon STM:[id number] DOB: May 05, 1965 DOA: 01/01/2018  PCP: Leonard Downing, MD  Admit date: 01/01/2018 Discharge date: 01/03/2018  Time spent: 33 minutes  Recommendations for Outpatient Follow-up:  1. Follow up outpatient CBC/CMP 2. Follow up lipids/triglycerides as outpatient 3. Ensure outpatient follow up with Dr. Percival Spanish   Discharge Diagnoses:  Principal Problem:   Chest pain Active Problems:   Type 2 diabetes mellitus with hyperglycemia ([place])   Hypothyroidism   Bipolar 2 disorder ([place])   History of rheumatoid arthritis   Chronic pain   Discharge Condition: stable  Diet recommendation: heart healthy  Filed Weights   01/01/18 2117 01/02/18 0530 01/03/18 0545  Weight: 102.9 kg 103.6 kg 104.2 kg    History of present illness:  Vincent [name] 52 y.o.malewith medical history significant forhypertension, COPD, chronic pain, and bipolar disorder, now presenting to the emergency department for evaluation of chest pain. Patient reports that he began to develop intermittent chest pain about 1 month ago. He describes this as Alias Villagran pressure sensation, oftentimes worse with exertion and better with rest sometimes associated with mild dyspnea, sometimes radiating to the left arm.He denies any recent fevers, chills, cough, leg swelling, or leg tenderness. Reports that his glucose has been difficult to control. Reports that his psychiatric issues are under fair control and denies any HI, SI, or hallucinations  He was admitted for CP r/o.  He had negative enzymes.  He had coronary CT with FFR which was notable for moderate CAD in proximal and mid RCA, but no significant stenosis on FFR.   Hospital Course:  1.Chest pain -Presents with intermittent chest pain, sometimes worse with exertion -EKG without appreciable ischemic features, CXR unremarkable, serial troponins negative, no evidence for VTE -Cardiology is consulting and  much appreciated- s/p coronary CT -> moderate CAD in proximal and mid RCA, but negative FFR, plan for d/c with outpatient follow up Discussed smoking cessation, DM control, lipid control importance.  # Dyslipidemia  Hypertriglyceridemia: started on fibrate.  Continue statin.  Needs outpatient follow up.  2.Insulin-dependent DM -A1c was 9.1% earlier this month -resume home regimen, discussed importance of BG control  3.Bipolar disorder  - Stable, follows closely with behavioral health -Continue Current management with Abilify, Effexor, Lamictal, [name], and as-needed Xanax  4.Chronic pain; rheumatoid arthritis -No pain complaints on admission -Continue current management with Plaquenil, fentanyl patch, and gabapentin  5.Hypothyroidism -Continue Synthroid  6. COPD - No wheezing on exam - Continue albuterol as-needed  Procedures:  none  Consultations:  cardiology  Discharge Exam: Vitals:   01/03/18 0741 01/03/18 0849  BP: 121/80 132/73  Pulse: 83 77  Resp: 16   Temp: 97.7 F (36.5 C)   SpO2: 97%    No concerns Ready to go home  General: No acute distress. Cardiovascular: Heart sounds show Anslie Spadafora regular rate, and rhythm.  Lungs: Clear to auscultation bilaterally  Abdomen: Soft, nontender, nondistended  Neurological: Alert and oriented 3. Moves all extremities 4 . Cranial nerves II through XII grossly intact. Skin: Warm and dry. No rashes or lesions. Extremities: No clubbing or cyanosis. No edema.  Psychiatric: Mood and affect are normal. Insight and judgment are appropriate.   Discharge Instructions   Discharge Instructions    Call MD for:  difficulty breathing, headache or visual disturbances   Complete by:  As directed    Call MD for:  extreme fatigue   Complete by:  As directed    Call MD for:  persistant dizziness  or light-headedness   Complete by:  As directed    Call MD for:  persistant nausea and vomiting   Complete  by:  As directed    Call MD for:  redness, tenderness, or signs of infection (pain, swelling, redness, odor or green/yellow discharge around incision site)   Complete by:  As directed    Call MD for:  severe uncontrolled pain   Complete by:  As directed    Call MD for:  temperature >100.4   Complete by:  As directed    Diet - low sodium heart healthy   Complete by:  As directed    Discharge instructions   Complete by:  As directed    You were seen for chest pain.  Your workup was reassuring.  We are going to have you follow up with cardiology as an outpatient.  We started metoprolol and imdur for you, please take these as prescribed.    Please follow up with cardiology as scheduled as an outpatient.  Please follow up with your PCP.  We started you on fenofibrate for your triglycerides.  It's important you take this and your atorvastatin to help improve your cholesterol.  Please continue to follow and adjust your insulin regimen with your primary care provider.  Improved blood sugar and cholesterol are important to helping reduce risk of heart attack and stroke.  Smoking cessation is also extremely important.  Return for new, recurrent, or worsening symptoms.  Please ask your PCP to request records from this hospitalization so they know what was done and what the next steps will be.   Increase activity slowly   Complete by:  As directed      Allergies as of 01/03/2018      Reactions   Celebrex [celecoxib] Anaphylaxis, Rash   Hydrocodone Rash, Other (See Comments)   "blisters developed on arms"   Sulfa Antibiotics Rash      Medication List    TAKE these medications   ALPRAZolam 0.5 MG tablet Commonly known as:  XANAX Take 1 tablet (0.5 mg total) by mouth 2 (two) times daily as needed for anxiety. TAKE 1 TABLET BY MOUTH THREE TIMES DAILY AS NEEDED FOR ANXIETY (ANGER,  PANIC) What changed:  additional instructions   ARIPiprazole 10 MG tablet Commonly known as:  ABILIFY Take 1  tablet (10 mg total) by mouth daily.   atorvastatin 40 MG tablet Commonly known as:  LIPITOR Take 1 tablet (40 mg total) by mouth daily at 6 PM.   BASAGLAR KWIKPEN 100 UNIT/ML Sopn Inject 40 Units into the skin 2 (two) times daily.   fenofibrate 160 MG tablet Take 1 tablet (160 mg total) by mouth daily.   fentaNYL 75 MCG/HR Commonly known as:  DURAGESIC - dosed mcg/hr Place 75 mcg onto the skin every 3 (three) days.   gabapentin 600 MG tablet Commonly known as:  NEURONTIN Take 2 tablets (1,200 mg total) by mouth 2 (two) times daily.   hydroxychloroquine 200 MG tablet Commonly known as:  PLAQUENIL Take 200 mg by mouth 2 (two) times daily.   insulin lispro 100 UNIT/ML injection Commonly known as:  HUMALOG Inject 15 Units into the skin 2 (two) times daily.   isosorbide mononitrate 30 MG 24 hr tablet Commonly known as:  IMDUR Take 0.5 tablets (15 mg total) by mouth daily. Start taking on:  01/04/2018   lamoTRIgine 100 MG tablet Commonly known as:  LAMICTAL Take 2 tablets (200 mg total) by mouth 2 (two) times daily.  Must be seen prior to future refills.  Please call [phone] for an appt.   levothyroxine 25 MCG tablet Commonly known as:  SYNTHROID, LEVOTHROID Take 25 mcg by mouth daily before breakfast.   metoCLOPramide 10 MG tablet Commonly known as:  REGLAN Take 10 mg by mouth daily.   metoprolol tartrate 25 MG tablet Commonly known as:  LOPRESSOR Take 1 tablet (25 mg total) by mouth 2 (two) times daily.   mirtazapine 15 MG tablet Commonly known as:  [name] Take 1 tablet (15 mg total) by mouth at bedtime.   MULTIVITAMIN PO Take 1 tablet by mouth daily.   nicotine 21 mg/24hr patch Commonly known as:  NICODERM CQ - dosed in mg/24 hours Place 1 patch (21 mg total) onto the skin daily.   nicotine polacrilex 2 MG gum Commonly known as:  NICORETTE Take 1 each (2 mg total) by mouth as needed for smoking cessation.   omeprazole 20 MG capsule Commonly known as:   PRILOSEC Take 20 mg by mouth daily.   ondansetron 4 MG disintegrating tablet Commonly known as:  ZOFRAN-ODT Take 1 tablet (4 mg total) by mouth every 8 (eight) hours as needed for nausea or vomiting.   pregabalin 100 MG capsule Commonly known as:  LYRICA Take 100 mg by mouth See admin instructions. Take 1 capsule every morning and take 2 capsules every night   RASUVO 25 MG/0.5ML Soaj Generic drug:  Methotrexate (PF) Inject 25 mg into the skin every 7 (seven) days.   TRULICITY 0.35 WS/5.6CL Sopn Generic drug:  Dulaglutide Inject 1 pen into the skin once Danay Mckellar week.   venlafaxine XR 150 MG 24 hr capsule Commonly known as:  EFFEXOR-XR Take 2 capsules (300 mg total) by mouth daily with breakfast.   Vitamin D3 2000 units Tabs TAKE 1 TABLET BY MOUTH ONCE DAILY What changed:  how much to take      Allergies  Allergen Reactions  . Celebrex [Celecoxib] Anaphylaxis and Rash  . Hydrocodone Rash and Other (See Comments)    "blisters developed on arms"  . Sulfa Antibiotics Rash      The results of significant diagnostics from this hospitalization (including imaging, microbiology, ancillary and laboratory) are listed below for reference.    Significant Diagnostic Studies: Dg Chest 2 View  Result Date: 01/01/2018 CLINICAL DATA:  Chest pain EXAM: CHEST - 2 VIEW COMPARISON:  August 25, 2017 FINDINGS: There is no edema or consolidation. The heart size and pulmonary vascularity are normal. No adenopathy. There is evidence of old trauma involving the distal right clavicle. No pneumothorax. IMPRESSION: No edema or consolidation. Electronically Signed   By: Lowella Grip III M.D.   On: 01/01/2018 15:44   Ct Coronary Morph W/cta Cor W/score W/ca W/cm &/or Wo/cm  Addendum Date: 01/02/2018   ADDENDUM REPORT: 01/02/2018 17:47 CLINICAL DATA:  52 year old male with h/o DM, hyperlipidemia, smoking and chest pain. EXAM: Cardiac/Coronary  CT TECHNIQUE: The patient was scanned on [employer] . FINDINGS: Jacolyn Joaquin 120 kV prospective scan was triggered in the descending thoracic aorta at 111 HU's. Axial non-contrast 3 mm slices were carried out through the heart. The data set was analyzed on Rogan Ecklund dedicated work station and scored using the [place]. Gantry rotation speed was 250 msecs and collimation was .6 mm. No beta blockade and 0.8 mg of sl NTG was given. The 3D data set was reconstructed in 5% intervals of the 67-82 % of the R-R cycle. Diastolic phases were analyzed on Nikolaj Geraghty dedicated  work station using MPR, MIP and VRT modes. The patient received 80 cc of contrast. Aorta: Normal size. Mild diffuse calcifications and atheroma. No dissection. Aortic Valve:  Trileaflet.  No calcifications. Coronary Arteries:  Normal coronary origin.  Right dominance. RCA is Creedon Danielski large dominant artery that gives rise to PDA and PLVB. There mild calcified plaque in the proximal RCA associated with 25-50% stenosis. This is followed by Daisy Lites moderate mixed, predominantly non-calcified plaque with stenosis 50-69%. Mid RCA has another focal moderate mixed plaque with stenosis 50-60%. Distal RCA has only minimal plaque. RCA has diffuse, mild mostly calcified plaque with stenosis 25-50%. PLA is Saundra Gin large artery with minimal plaque. Left main is Reford Olliff large and long artery that gives rise to LAD, LCX arteries and Taden Witter very small ramus intermedius. Left main has no plaque. LAD is Meekah Math large vessel that gives rise to Maxmillian Carsey large first diagonal artery and has minimal irregularities. LCX is Manasvini Whatley small caliber non-dominant artery that gives rise to one small OM1 branch and has minimal plaque. Other findings: Normal pulmonary vein drainage into the left atrium. Normal let atrial appendage without Praise Dolecki thrombus. IMPRESSION: 1. Coronary calcium score of 164. This was 32 percentile for age and sex matched control. 2. Normal coronary origin with right dominance. 3. Moderate CAD in the proximal and mid RCA. Additional analysis with CT FFR will be submitted. 4. Dilated  pulmonary artery measuring 34 mm suggestive of pulmonary hypertension. Electronically Signed   By: Ena Dawley   On: 01/02/2018 17:47   Result Date: 01/02/2018 EXAM: OVER-READ INTERPRETATION  CT CHEST The following report is an over-read performed by radiologist Dr. Markus Daft of [facility], [place] on 01/02/2018. This over-read does not include interpretation of cardiac or coronary anatomy or pathology. The coronary calcium score/coronary CTA interpretation by the cardiologist is attached. COMPARISON:  10/13/2010 FINDINGS: Vascular: Normal caliber of the visualized thoracic aorta. No significant pericardial fluid. Pulmonary arteries are not well opacified on this examination. Heart size is within normal limits. Minimal wall calcifications in the descending thoracic aorta. Mediastinum/Nodes: No significant lymph node enlargement in the visualized mediastinum and hilar regions. Lungs/Pleura: No large pleural effusions. There is no significant airspace disease or consolidation. Few densities along the posterior lower lobes are most compatible with atelectasis. Upper Abdomen: Images of the upper abdomen are unremarkable. Musculoskeletal: No acute abnormality. IMPRESSION: No acute abnormality.  Negative over-read exam. Electronically Signed: By: Markus Daft M.D. On: 01/02/2018 16:18   Ct Coronary Fractional Flow Reserve Fluid Analysis  Result Date: 01/03/2018 EXAM: CT FFR ANALYSIS CLINICAL DATA:  52 year old male with chest pain. FINDINGS: FFRct analysis was performed on the original cardiac CT angiogram dataset. Diagrammatic representation of the FFRct analysis is provided in Soraiya Ahner separate PDF document in PACS. This dictation was created using the PDF document and an interactive 3D model of the results. 3D model is not available in the EMR/PACS. Normal FFR range is >0.80. 1. Left Main:  No significant stenosis. 2. LAD: No significant stenosis. 3. LCX: No significant stenosis. 4. RCA: No significant  stenosis. IMPRESSION: 1.  CT FFR analysis didn't show any significant stenosis. Electronically Signed   By: Ena Dawley   On: 01/03/2018 08:09    Microbiology: No results found for this or any previous visit (from the past 240 hour(s)).   Labs: Basic Metabolic Panel: Recent Labs  Lab 01/01/18 1453 01/03/18 0505  NA 137 138  K 4.1 3.9  CL 102 105  CO2 22 26  GLUCOSE 370*  189*  BUN 14 11  CREATININE 1.09 0.93  CALCIUM 9.5 8.5*  MG  --  2.2   Liver Function Tests: No results for input(s): AST, ALT, ALKPHOS, BILITOT, PROT, ALBUMIN in the last 168 hours. No results for input(s): LIPASE, AMYLASE in the last 168 hours. No results for input(s): AMMONIA in the last 168 hours. CBC: Recent Labs  Lab 01/01/18 1453 01/03/18 0505  WBC 7.4 8.2  HGB 16.5 15.2  HCT 47.0 44.6  MCV 86.4 88.8  PLT 240 202   Cardiac Enzymes: Recent Labs  Lab 01/01/18 2106 01/02/18 0238 01/02/18 0712  TROPONINI <0.03 <0.03 <0.03   BNP: BNP (last 3 results) No results for input(s): BNP in the last 8760 hours.  ProBNP (last 3 results) No results for input(s): PROBNP in the last 8760 hours.  CBG: Recent Labs  Lab 01/02/18 0729 01/02/18 1135 01/02/18 1627 01/02/18 2134 01/03/18 0733  GLUCAP 350* 263* 112* 311* 238*       Signed:  Fayrene Helper MD.  [facility] 01/03/2018, 9:31 AM

## 2018-01-03 NOTE — Progress Notes (Signed)
Progress Note  Patient Name: Vincent [name] [facility] Date of Encounter: 01/03/2018  Primary Cardiologist: Minus Breeding, MD    Subjective   No pain discussed FFR CT results   Inpatient Medications    Scheduled Meds: . ARIPiprazole  10 mg Oral Daily  . atorvastatin  40 mg Oral q1800  . cholecalciferol  2,000 Units Oral Daily  . enoxaparin (LOVENOX) injection  40 mg Subcutaneous Q24H  . fenofibrate  160 mg Oral Daily  . fentaNYL  75 mcg Transdermal Q72H  . gabapentin  1,200 mg Oral BID  . hydroxychloroquine  200 mg Oral BID  . insulin aspart  0-5 Units Subcutaneous QHS  . insulin aspart  0-9 Units Subcutaneous TID WC  . insulin aspart  6 Units Subcutaneous TID WC  . insulin glargine  35 Units Subcutaneous BID  . isosorbide mononitrate  15 mg Oral Daily  . lamoTRIgine  200 mg Oral BID  . levothyroxine  25 mcg Oral Q0600  . metoCLOPramide  10 mg Oral Daily  . metoprolol tartrate  25 mg Oral BID  . mirtazapine  15 mg Oral QHS  . nicotine  21 mg Transdermal Daily  . pantoprazole  40 mg Oral Daily  . pregabalin  100 mg Oral Daily  . pregabalin  200 mg Oral QHS  . venlafaxine XR  300 mg Oral Q breakfast   Continuous Infusions: . sodium chloride 50 mL/hr at 01/03/18 0631   PRN Meds: acetaminophen, ALPRAZolam, diltiazem, nicotine polacrilex, nitroGLYCERIN, ondansetron (ZOFRAN) IV   Vital Signs    Vitals:   01/02/18 1936 01/02/18 2332 01/03/18 0545 01/03/18 0741  BP: 115/64 120/70 117/76 121/80  Pulse: 78 80 83 83  Resp: 18  18 16   Temp: 98.4 F (36.9 C)  98.3 F (36.8 C) 97.7 F (36.5 C)  TempSrc: Oral  Oral Oral  SpO2: 97%  97% 97%  Weight:   104.2 kg   Height:        Intake/Output Summary (Last 24 hours) at 01/03/2018 0852 Last data filed at 01/03/2018 0553 Gross per 24 hour  Intake 940 ml  Output 1050 ml  Net -110 ml   Filed Weights   01/01/18 2117 01/02/18 0530 01/03/18 0545  Weight: 102.9 kg 103.6 kg 104.2 kg    Telemetry    NSR  - Personally  Reviewed  ECG    NSR no acute changes  - Personally Reviewed  Physical Exam   GEN: No acute distress.   Neck: No JVD Cardiac: RRR, no murmurs, rubs, or gallops.  Respiratory: Clear to auscultation bilaterally. GI: Soft, nontender, non-distended  MS: No edema; No deformity. Neuro:  Nonfocal  Psych: Normal affect   Labs    Chemistry Recent Labs  Lab 01/01/18 1453 01/03/18 0505  NA 137 138  K 4.1 3.9  CL 102 105  CO2 22 26  GLUCOSE 370* 189*  BUN 14 11  CREATININE 1.09 0.93  CALCIUM 9.5 8.5*  GFRNONAA >60 >60  GFRAA >60 >60  ANIONGAP 13 7     Hematology Recent Labs  Lab 01/01/18 1453 01/03/18 0505  WBC 7.4 8.2  RBC 5.44 5.02  HGB 16.5 15.2  HCT 47.0 44.6  MCV 86.4 88.8  MCH 30.3 30.3  MCHC 35.1 34.1  RDW 12.2 12.5  PLT 240 202    Cardiac Enzymes Recent Labs  Lab 01/01/18 2106 01/02/18 0238 01/02/18 0712  TROPONINI <0.03 <0.03 <0.03    Recent Labs  Lab 01/01/18 1459  TROPIPOC 0.00  BNPNo results for input(s): BNP, PROBNP in the last 168 hours.   DDimer No results for input(s): DDIMER in the last 168 hours.   Radiology    Dg Chest 2 View  Result Date: 01/01/2018 CLINICAL DATA:  Chest pain EXAM: CHEST - 2 VIEW COMPARISON:  August 25, 2017 FINDINGS: There is no edema or consolidation. The heart size and pulmonary vascularity are normal. No adenopathy. There is evidence of old trauma involving the distal right clavicle. No pneumothorax. IMPRESSION: No edema or consolidation. Electronically Signed   By: Lowella Grip III M.D.   On: 01/01/2018 15:44   Ct Coronary Morph W/cta Cor W/score W/ca W/cm &/or Wo/cm  Addendum Date: 01/02/2018   ADDENDUM REPORT: 01/02/2018 17:47 CLINICAL DATA:  52 year old male with h/o DM, hyperlipidemia, smoking and chest pain. EXAM: Cardiac/Coronary  CT TECHNIQUE: The patient was scanned on a [employer]. FINDINGS: A 120 kV prospective scan was triggered in the descending thoracic aorta at 111 HU's.  Axial non-contrast 3 mm slices were carried out through the heart. The data set was analyzed on a dedicated work station and scored using the [place]. Gantry rotation speed was 250 msecs and collimation was .6 mm. No beta blockade and 0.8 mg of sl NTG was given. The 3D data set was reconstructed in 5% intervals of the 67-82 % of the R-R cycle. Diastolic phases were analyzed on a dedicated work station using MPR, MIP and VRT modes. The patient received 80 cc of contrast. Aorta: Normal size. Mild diffuse calcifications and atheroma. No dissection. Aortic Valve:  Trileaflet.  No calcifications. Coronary Arteries:  Normal coronary origin.  Right dominance. RCA is a large dominant artery that gives rise to PDA and PLVB. There mild calcified plaque in the proximal RCA associated with 25-50% stenosis. This is followed by a moderate mixed, predominantly non-calcified plaque with stenosis 50-69%. Mid RCA has another focal moderate mixed plaque with stenosis 50-60%. Distal RCA has only minimal plaque. RCA has diffuse, mild mostly calcified plaque with stenosis 25-50%. PLA is a large artery with minimal plaque. Left main is a large and long artery that gives rise to LAD, LCX arteries and a very small ramus intermedius. Left main has no plaque. LAD is a large vessel that gives rise to a large first diagonal artery and has minimal irregularities. LCX is a small caliber non-dominant artery that gives rise to one small OM1 branch and has minimal plaque. Other findings: Normal pulmonary vein drainage into the left atrium. Normal let atrial appendage without a thrombus. IMPRESSION: 1. Coronary calcium score of 164. This was 45 percentile for age and sex matched control. 2. Normal coronary origin with right dominance. 3. Moderate CAD in the proximal and mid RCA. Additional analysis with CT FFR will be submitted. 4. Dilated pulmonary artery measuring 34 mm suggestive of pulmonary hypertension. Electronically Signed   By:  Ena Dawley   On: 01/02/2018 17:47   Result Date: 01/02/2018 EXAM: OVER-READ INTERPRETATION  CT CHEST The following report is an over-read performed by radiologist Dr. Markus Daft of [facility], [place] on 01/02/2018. This over-read does not include interpretation of cardiac or coronary anatomy or pathology. The coronary calcium score/coronary CTA interpretation by the cardiologist is attached. COMPARISON:  10/13/2010 FINDINGS: Vascular: Normal caliber of the visualized thoracic aorta. No significant pericardial fluid. Pulmonary arteries are not well opacified on this examination. Heart size is within normal limits. Minimal wall calcifications in the descending thoracic aorta. Mediastinum/Nodes: No significant lymph node enlargement  in the visualized mediastinum and hilar regions. Lungs/Pleura: No large pleural effusions. There is no significant airspace disease or consolidation. Few densities along the posterior lower lobes are most compatible with atelectasis. Upper Abdomen: Images of the upper abdomen are unremarkable. Musculoskeletal: No acute abnormality. IMPRESSION: No acute abnormality.  Negative over-read exam. Electronically Signed: By: Markus Daft M.D. On: 01/02/2018 16:18   Ct Coronary Fractional Flow Reserve Fluid Analysis  Result Date: 01/03/2018 EXAM: CT FFR ANALYSIS CLINICAL DATA:  52 year old male with chest pain. FINDINGS: FFRct analysis was performed on the original cardiac CT angiogram dataset. Diagrammatic representation of the FFRct analysis is provided in a separate PDF document in PACS. This dictation was created using the PDF document and an interactive 3D model of the results. 3D model is not available in the EMR/PACS. Normal FFR range is >0.80. 1. Left Main:  No significant stenosis. 2. LAD: No significant stenosis. 3. LCX: No significant stenosis. 4. RCA: No significant stenosis. IMPRESSION: 1.  CT FFR analysis didn't show any significant stenosis. Electronically Signed    By: Ena Dawley   On: 01/03/2018 08:09    Cardiac Studies   None  Patient Profile     52 y.o. male chest pain. Previous cath 2003/2001 normal CRF;s poorly controlled DM , smoking And HLD for cardiac CTA 01/02/18  Assessment & Plan    Chest Pain: risk factors r/o atypical presentation. CT with only 40-50% proximal / mid RCA with negative FFR CT D/c home on beta blocker and nitrates will arrange outpatient f/u with Dr Percival Spanish   HLD:  Triglycerides are over 1000 start fibrates f/u outpatient with Dr Darron Doom   DM:  macular degeneration      For questions or updates, please contact [facility] Please consult [email or web address] for contact info under        Signed, Jenkins Rouge, MD  01/03/2018, 8:52 AM

## 2018-01-03 NOTE — Progress Notes (Signed)
Patient ready for discharge. 

## 2018-01-21 DIAGNOSIS — Z7289 Other problems related to lifestyle: Secondary | ICD-10-CM | POA: Diagnosis not present

## 2018-01-21 DIAGNOSIS — R739 Hyperglycemia, unspecified: Secondary | ICD-10-CM | POA: Diagnosis not present

## 2018-01-21 DIAGNOSIS — I10 Essential (primary) hypertension: Secondary | ICD-10-CM | POA: Diagnosis not present

## 2018-01-21 DIAGNOSIS — G64 Other disorders of peripheral nervous system: Secondary | ICD-10-CM | POA: Diagnosis not present

## 2018-01-21 DIAGNOSIS — D6489 Other specified anemias: Secondary | ICD-10-CM | POA: Diagnosis not present

## 2018-01-21 DIAGNOSIS — L0390 Cellulitis, unspecified: Secondary | ICD-10-CM | POA: Diagnosis not present

## 2018-01-24 ENCOUNTER — Ambulatory Visit (INDEPENDENT_AMBULATORY_CARE_PROVIDER_SITE_OTHER): Payer: 59 | Admitting: Orthopedic Surgery

## 2018-01-24 ENCOUNTER — Ambulatory Visit (INDEPENDENT_AMBULATORY_CARE_PROVIDER_SITE_OTHER): Payer: Self-pay

## 2018-01-24 ENCOUNTER — Encounter (INDEPENDENT_AMBULATORY_CARE_PROVIDER_SITE_OTHER): Payer: Self-pay | Admitting: Orthopedic Surgery

## 2018-01-24 VITALS — Ht 71.0 in | Wt 229.7 lb

## 2018-01-24 DIAGNOSIS — E1165 Type 2 diabetes mellitus with hyperglycemia: Secondary | ICD-10-CM

## 2018-01-24 DIAGNOSIS — M79672 Pain in left foot: Secondary | ICD-10-CM

## 2018-01-24 DIAGNOSIS — F17200 Nicotine dependence, unspecified, uncomplicated: Secondary | ICD-10-CM

## 2018-01-24 DIAGNOSIS — Z794 Long term (current) use of insulin: Secondary | ICD-10-CM

## 2018-01-24 MED ORDER — MUPIROCIN 2 % EX OINT: 1.0000 "application " | TOPICAL_OINTMENT | Freq: Two times a day (BID) | CUTANEOUS | 3 refills | Status: DC

## 2018-01-28 ENCOUNTER — Encounter (INDEPENDENT_AMBULATORY_CARE_PROVIDER_SITE_OTHER): Payer: Self-pay | Admitting: Orthopedic Surgery

## 2018-01-28 NOTE — Progress Notes (Signed)
Office Visit Note   Patient: Vincent [name]           Date of Birth: 1965-09-21           MRN: [id number] Visit Date: 01/24/2018              Requested by: Leonard Downing, MD [address] [place], [place] [address] PCP: Leonard Downing, MD  Chief Complaint  Patient presents with  . Left Foot - Pain    NP; Left Great toe Infection; referred by Dr Darron Doom      HPI: Patient is a 52 year old gentleman with type 2 diabetes uncontrolled who is seen for initial evaluation for infection of the left great toe.  Patient states that he has taken antibiotics including doxycycline and a penicillin injection.  Patient complains of peeling raw skin his diabetes uncontrolled with a hemoglobin A1c of 10.5 patient states he has pain with walking and has to be on his feet for about 6 to 7 hours a day.  He is in regular shoewear he is not on any blood thinners.  Patient has a history of tobacco use.  Assessment & Plan: Visit Diagnoses:  1. Left foot pain   2. Tobacco use disorder   3. Type 2 diabetes mellitus with hyperglycemia, with long-term current use of insulin (HCC)     Plan: Recommended smoking cessation.  Patient was given instructions for Achilles stretching to unload the forefoot.  We will call in a prescription for an antibiotic ointment.  Follow-Up Instructions: Return in about 2 weeks (around 02/07/2018).   Ortho Exam  Patient is alert, oriented, no adenopathy, well-dressed, normal affect, normal respiratory effort. Examination patient has a good dorsalis pedis pulse he has significant heel cord contracture with dorsiflexion 20 degrees short of neutral with his knee extended.  He has an ulcer on the great toe.  After informed consent the ulcer was debrided of skin and soft tissue this is 2 cm in diameter after debridement and 5 mm deep there is no exposed bone or tendon.  No abscess no drainage.  Imaging: No results found. No images are attached to the  encounter.  Labs: Lab Results  Component Value Date   HGBA1C 9.1 (H) 12/18/2017   HGBA1C 9.1 (H) 05/06/2016   HGBA1C 8.0 03/02/2015   REPTSTATUS 03/06/2014 FINAL 03/04/2014   CULT NO GROWTH Performed at [facility]  03/04/2014     Lab Results  Component Value Date   ALBUMIN 3.9 05/17/2017   ALBUMIN 3.7 05/28/2015   ALBUMIN 4.2 04/01/2015    Body mass index is 32.04 kg/m.  Orders:  Orders Placed This Encounter  Procedures  . XR Foot 2 Views Left   Meds ordered this encounter  Medications  . mupirocin ointment (BACTROBAN) 2 %    Sig: Apply 1 application topically 2 (two) times daily. Apply to the affected area 2 times a day    Dispense:  22 g    Refill:  3     Procedures: No procedures performed  Clinical Data: No additional findings.  ROS:  All other systems negative, except as noted in the HPI. Review of Systems  Objective: Vital Signs: Ht 5\' 11"  (1.803 m)   Wt 229 lb 11.2 oz (104.2 kg)   BMI 32.04 kg/m   Specialty Comments:  No specialty comments available.  PMFS History: Patient Active Problem List   Diagnosis Date Noted  . Chest pain 01/01/2018  . Seizure disorder ([place]) 12/19/2016  .  Essential tremor 12/19/2016  . Polypharmacy 12/19/2016  . Chronic post-traumatic stress disorder (PTSD) 06/16/2016  . Tremor 06/16/2016  . Tobacco use disorder 05/05/2016  . Psychophysiological insomnia 06/02/2015  . Altered mental status 05/28/2015  . Acute bronchitis 05/28/2015  . Type 2 diabetes mellitus with hyperglycemia ([place]) 05/28/2015  . Hypothyroidism 05/28/2015  . Bipolar 2 disorder ([place]) 05/28/2015  . History of rheumatoid arthritis 05/28/2015  . Chronic pain 05/28/2015  . Depression 03/18/2015  . Diabetes ([place]) 01/23/2015  . Obesity (BMI 30-39.9) 03/06/2014  . Acute encephalopathy 03/05/2014  . HLD (hyperlipidemia) 03/05/2014  . Anemia, normocytic normochromic 03/05/2014  . Altered mental state   . Protein-calorie malnutrition,  severe ([place]) 10/09/2012  . Bladder tumor 08/08/2012  . Biliary dyskinesia 11/02/2010   Past Medical History:  Diagnosis Date  . Anxiety   . Arthritis   . Bipolar 1 disorder ([place])   . Chronic pain syndrome    back  . COPD (chronic obstructive pulmonary disease) ([place])   . Gastroparesis   . GERD (gastroesophageal reflux disease)   . Hiatal hernia   . History of bladder cancer urologist-  dr Consuella Lose   papillay TCC (Ta G1)  s/p TURBT and chemo instillation 2014  . History of encephalopathy 05/27/2015   admission w/ acute encephalopathy thought to be secondary to pain meds and COPD  . History of gastric ulcer   . History of kidney stones   . History of TIA (transient ischemic attack) 2008    no residual  . History of traumatic head injury 2010   w/ LOC  per pt needed stitches  . Hyperlipidemia   . Hypothyroidism   . Insomnia    per sleep study 04-19-2015 without sleep apnea  . Neuropathy in diabetes ([place])    LOWER EXTREMITIES  . Seizures, transient [facility]) neurologist-  dr Krista Blue--  differential dx complex partial seizure .vs.  mood disorder .vs.  pseudoseizure--  negative EEG's   confusion episodes and staring spells since 11/ 2015  . Transient confusion NEUOROLOGIST-  DR YAN   Episodes since 11/ 2015--  neurologist dx  differential complex partial seizure  .vs. mood disorder . vs. pseudoseizure  . Type 2 diabetes mellitus treated with insulin ([place])   . Urinary hesitancy     Family History  Problem Relation Age of Onset  . Diabetes Mother   . Diabetes Father   . Hypertension Father   . Heart attack Father 34       died age 37  . Alcohol abuse Father     Past Surgical History:  Procedure Laterality Date  . CARDIAC CATHETERIZATION  12-27-2001  DR Einar Gip  &  05-26-2009  DR Irish Lack   RESULTS FOR BOTH ARE NORMAL CORONARIES AND PERSERVED LVF/ EF 60%  . CARPAL TUNNEL RELEASE Right 09-16-2003  . CARPAL TUNNEL RELEASE Left 02/25/2015   Procedure: LEFT CARPAL TUNNEL RELEASE;   Surgeon: Leanora Cover, MD;  Location: [place];  Service: Orthopedics;  Laterality: Left;  . CYSTOSCOPY N/A 10/10/2012   Procedure: CYSTOSCOPY CLOT EVACUATION FULGERATION OF BLEEDERS ;  Surgeon: Claybon Jabs, MD;  Location: [facility];  Service: Urology;  Laterality: N/A;  . CYSTOSCOPY WITH BIOPSY N/A 11/26/2015   Procedure: CYSTOSCOPY WITH BIOPSY AND FULGURATION;  Surgeon: Kathie Rhodes, MD;  Location: [place];  Service: Urology;  Laterality: N/A;  . LAPAROSCOPIC CHOLECYSTECTOMY  11-17-2010  . NEGATIVE SLEEP STUDY  04-19-2015  in epic  . ORCHIECTOMY Right 02/21/2016  Procedure: SCROTAL ORCHIECTOMY with TESTICULAR PROSTHESIS IMPLANT;  Surgeon: Kathie Rhodes, MD;  Location[facility];  Service: Urology;  Laterality: Right;  . ROTATOR CUFF REPAIR Right 12/2004  . TRANSURETHRAL RESECTION OF BLADDER TUMOR N/A 08/09/2012   Procedure: TRANSURETHRAL RESECTION OF BLADDER TUMOR (TURBT) WITH GYRUS WITH MITOMYCIN C;  Surgeon: Claybon Jabs, MD;  Location: [facility];  Service: Urology;  Laterality: N/A;  . TRANSURETHRAL RESECTION OF BLADDER TUMOR WITH GYRUS (TURBT-GYRUS) N/A 02/27/2014   Procedure: TRANSURETHRAL RESECTION OF BLADDER TUMOR WITH GYRUS (TURBT-GYRUS);  Surgeon: Claybon Jabs, MD;  Location: [facility];  Service: Urology;  Laterality: N/A;   Social History   Occupational History  . Occupation: Engineer, technical sales    Comment: Owner of company  Tobacco Use  . Smoking status: Current Every Day Smoker    Packs/day: 1.00    Years: 25.00    Pack years: 25.00    Types: Cigarettes  . Smokeless tobacco: Never Used  . Tobacco comment: Has cut back to 1 pack a day  Substance and Sexual Activity  . Alcohol use: No  . Drug use: No  . Sexual activity: Yes    Partners: Female    Birth control/protection: None

## 2018-01-29 ENCOUNTER — Ambulatory Visit (INDEPENDENT_AMBULATORY_CARE_PROVIDER_SITE_OTHER): Payer: 59 | Admitting: Psychiatry

## 2018-01-29 ENCOUNTER — Ambulatory Visit (HOSPITAL_COMMUNITY): Payer: 59 | Admitting: Psychiatry

## 2018-01-29 ENCOUNTER — Encounter (HOSPITAL_COMMUNITY): Payer: Self-pay | Admitting: Psychiatry

## 2018-01-29 VITALS — BP 130/72 | Ht 72.0 in | Wt 226.0 lb

## 2018-01-29 DIAGNOSIS — F3181 Bipolar II disorder: Secondary | ICD-10-CM | POA: Diagnosis not present

## 2018-01-29 DIAGNOSIS — F4312 Post-traumatic stress disorder, chronic: Secondary | ICD-10-CM | POA: Diagnosis not present

## 2018-01-29 DIAGNOSIS — F411 Generalized anxiety disorder: Secondary | ICD-10-CM

## 2018-01-29 MED ORDER — ARIPIPRAZOLE 10 MG PO TABS: 10.0000 mg | ORAL_TABLET | Freq: Every day | ORAL | 0 refills | Status: DC

## 2018-01-29 MED ORDER — MIRTAZAPINE 15 MG PO TABS: 15.0000 mg | ORAL_TABLET | Freq: Every day | ORAL | 0 refills | Status: DC

## 2018-01-29 MED ORDER — HYDROXYZINE PAMOATE 25 MG PO CAPS: 25.0000 mg | ORAL_CAPSULE | Freq: Two times a day (BID) | ORAL | 1 refills | Status: DC

## 2018-01-29 MED ORDER — VENLAFAXINE HCL ER 150 MG PO CP24: 300.0000 mg | ORAL_CAPSULE | Freq: Every day | ORAL | 0 refills | Status: DC

## 2018-01-29 MED ORDER — LAMOTRIGINE 100 MG PO TABS: ORAL_TABLET | ORAL | 1 refills | Status: DC
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
## 2018-01-30 ENCOUNTER — Encounter: Payer: Self-pay | Admitting: Cardiology

## 2018-01-30 NOTE — Progress Notes (Signed)
Cardiology Office Note   Date:  01/31/2018   ID:  Vincent [name], DOB 1965-04-02, MRN [id number]  PCP:  Hayden Rasmussen, MD  Cardiologist:   Minus Breeding, MD   Chief Complaint  Patient presents with  . Coronary Artery Disease      History of Present Illness: Vincent [name] is a 52 y.o. male who presents for follow up of chest pain.   I saw him in October in the hospital.  He ruled out and CT demonstrated moderate CAD in the RCA.  There were no significant lesions and he was managed medically.    Since going home he is limited by his chronic back pain.  He sees a pain clinic.  Is working with physical therapy.  He says he does get chest discomfort he does not wear his patch and if he walks any distance.  This is unchanged from previous.  It does not sound like he is having any resting unprovoked discomfort.  He is not describing any new shortness of breath, PND or orthopnea.  Said no new palpitations, presyncope or syncope.  He reports being weak and tired some days worse than others.   Past Medical History:  Diagnosis Date  . Anxiety   . Arthritis   . Bipolar 1 disorder ([place])   . CAD (coronary artery disease)    Non obstructive on CTA Oct 2019.   Marland Kitchen Chronic pain syndrome    back  . COPD (chronic obstructive pulmonary disease) ([place])   . Gastroparesis   . GERD (gastroesophageal reflux disease)   . Hiatal hernia   . History of bladder cancer urologist-  dr Consuella Lose   papillay TCC (Ta G1)  s/p TURBT and chemo instillation 2014  . History of encephalopathy 05/27/2015   admission w/ acute encephalopathy thought to be secondary to pain meds and COPD  . History of gastric ulcer   . History of kidney stones   . History of TIA (transient ischemic attack) 2008    no residual  . History of traumatic head injury 2010   w/ LOC  per pt needed stitches  . Hyperlipidemia   . Hypothyroidism   . Insomnia    per sleep study 04-19-2015 without sleep apnea  . Neuropathy in diabetes  ([place])    LOWER EXTREMITIES  . Seizures, transient [facility]) neurologist-  dr Krista Blue--  differential dx complex partial seizure .vs.  mood disorder .vs.  pseudoseizure--  negative EEG's   confusion episodes and staring spells since 11/ 2015  . Transient confusion NEUOROLOGIST-  DR YAN   Episodes since 11/ 2015--  neurologist dx  differential complex partial seizure  .vs. mood disorder . vs. pseudoseizure  . Type 2 diabetes mellitus treated with insulin [facility])     Past Surgical History:  Procedure Laterality Date  . CARDIAC CATHETERIZATION  12-27-2001  DR Einar Gip  &  05-26-2009  DR Irish Lack   RESULTS FOR BOTH ARE NORMAL CORONARIES AND PERSERVED LVF/ EF 60%  . CARPAL TUNNEL RELEASE Right 09-16-2003  . CARPAL TUNNEL RELEASE Left 02/25/2015   Procedure: LEFT CARPAL TUNNEL RELEASE;  Surgeon: Leanora Cover, MD;  Location: [place];  Service: Orthopedics;  Laterality: Left;  . CYSTOSCOPY N/A 10/10/2012   Procedure: CYSTOSCOPY CLOT EVACUATION FULGERATION OF BLEEDERS ;  Surgeon: Claybon Jabs, MD;  Location: [facility];  Service: Urology;  Laterality: N/A;  . CYSTOSCOPY WITH BIOPSY N/A 11/26/2015   Procedure: CYSTOSCOPY WITH BIOPSY AND FULGURATION;  Surgeon: Kathie Rhodes, MD;  Location: [facility];  Service: Urology;  Laterality: N/A;  . LAPAROSCOPIC CHOLECYSTECTOMY  11-17-2010  . NEGATIVE SLEEP STUDY  04-19-2015  in epic  . ORCHIECTOMY Right 02/21/2016   Procedure: SCROTAL ORCHIECTOMY with TESTICULAR PROSTHESIS IMPLANT;  Surgeon: Kathie Rhodes, MD;  Location: [facility];  Service: Urology;  Laterality: Right;  . ROTATOR CUFF REPAIR Right 12/2004  . TRANSURETHRAL RESECTION OF BLADDER TUMOR N/A 08/09/2012   Procedure: TRANSURETHRAL RESECTION OF BLADDER TUMOR (TURBT) WITH GYRUS WITH MITOMYCIN C;  Surgeon: Claybon Jabs, MD;  Location: [facility];  Service: Urology;  Laterality: N/A;  . TRANSURETHRAL RESECTION OF BLADDER TUMOR WITH  GYRUS (TURBT-GYRUS) N/A 02/27/2014   Procedure: TRANSURETHRAL RESECTION OF BLADDER TUMOR WITH GYRUS (TURBT-GYRUS);  Surgeon: Claybon Jabs, MD;  Location: [facility];  Service: Urology;  Laterality: N/A;     Current Outpatient Medications  Medication Sig Dispense Refill  . ARIPiprazole (ABILIFY) 10 MG tablet Take 1 tablet (10 mg total) by mouth daily. 90 tablet 0  . atorvastatin (LIPITOR) 40 MG tablet Take 1 tablet (40 mg total) by mouth daily at 6 PM. 30 tablet 1  . Cholecalciferol (VITAMIN D3) 2000 units TABS TAKE 1 TABLET BY MOUTH ONCE DAILY (Patient taking differently: Take 2,000 Units by mouth daily. ) 90 tablet 0  . doxycycline (VIBRAMYCIN) 100 MG capsule Take 100 mg by mouth 2 (two) times daily.  0  . Dulaglutide (TRULICITY) 7.03 JK/0.9FG SOPN Inject 1 pen into the skin once a week.    Noelle Penner ASPIRIN ADULT LOW DOSE 81 MG EC tablet Take 81 mg by mouth daily.  0  . fenofibrate 160 MG tablet Take 1 tablet (160 mg total) by mouth daily. 30 tablet 0  . fentaNYL (DURAGESIC - DOSED MCG/HR) 75 MCG/HR Place 75 mcg onto the skin every 3 (three) days.     Marland Kitchen gabapentin (NEURONTIN) 600 MG tablet Take 2 tablets (1,200 mg total) by mouth 2 (two) times daily. 360 tablet 0  . hydroxychloroquine (PLAQUENIL) 200 MG tablet Take 200 mg by mouth 2 (two) times daily.     . hydrOXYzine (VISTARIL) 25 MG capsule Take 1 capsule (25 mg total) by mouth 2 (two) times daily. 60 capsule 1  . Insulin Glargine (BASAGLAR KWIKPEN) 100 UNIT/ML SOPN Inject 40 Units into the skin 2 (two) times daily.  11  . insulin lispro (HUMALOG) 100 UNIT/ML injection Inject 15 Units into the skin 2 (two) times daily.     . isosorbide mononitrate (IMDUR) 120 MG 24 hr tablet Take 1 tablet (120 mg total) by mouth daily. 90 tablet 3  . lamoTRIgine (LAMICTAL) 100 MG tablet Take one tab daily, 1/2 in afternoon and one tab at bed time. 90 tablet 1  . levothyroxine (SYNTHROID, LEVOTHROID) 25 MCG tablet Take 25 mcg by mouth daily  before breakfast.     . Methotrexate, PF, (RASUVO) 25 MG/0.5ML SOAJ Inject 25 mg into the skin every 7 (seven) days.    . metoCLOPramide (REGLAN) 10 MG tablet Take 10 mg by mouth daily.  11  . metoprolol tartrate (LOPRESSOR) 25 MG tablet Take 1 tablet (25 mg total) by mouth 2 (two) times daily. 60 tablet 0  . mirtazapine ([name]) 15 MG tablet Take 1 tablet (15 mg total) by mouth at bedtime. 90 tablet 0  . Multiple Vitamins-Minerals (MULTIVITAMIN PO) Take 1 tablet by mouth daily.    . mupirocin ointment (BACTROBAN) 2 % Apply 1  application topically 2 (two) times daily. Apply to the affected area 2 times a day 22 g 3  . omeprazole (PRILOSEC) 20 MG capsule Take 20 mg by mouth daily.     . ondansetron (ZOFRAN-ODT) 4 MG disintegrating tablet Take 1 tablet (4 mg total) by mouth every 8 (eight) hours as needed for nausea or vomiting. 15 tablet 0  . pregabalin (LYRICA) 100 MG capsule Take 100 mg by mouth See admin instructions. Take 1 capsule every morning and take 2 capsules every night    . Prenatal Vit-Fe Fumarate-FA (PREPLUS) 27-1 MG TABS Take 1 tablet by mouth daily.  3  . tamsulosin (FLOMAX) 0.4 MG CAPS capsule TAKE 1 CAPSULE BY MOUTH IN THE EVENING  11  . venlafaxine XR (EFFEXOR-XR) 150 MG 24 hr capsule Take 2 capsules (300 mg total) by mouth daily with breakfast. 180 capsule 0   No current facility-administered medications for this visit.     Allergies:   Celebrex [celecoxib]; Hydrocodone; and Sulfa antibiotics    ROS:  Please see the history of present illness.   Otherwise, review of systems are positive for none.   All other systems are reviewed and negative.    PHYSICAL EXAM: VS:  BP 139/80   Pulse 81   Ht 6' (1.829 m)   Wt 227 lb 6.4 oz (103.1 kg)   BMI 30.84 kg/m  , BMI Body mass index is 30.84 kg/m. GENERAL:  Well appearing HEENT:  Pupils equal round and reactive, fundi not visualized, oral mucosa unremarkable NECK:  No jugular venous distention, waveform within normal limits,  carotid upstroke brisk and symmetric, no bruits, no thyromegaly LYMPHATICS:  No cervical, inguinal adenopathy LUNGS:  Clear to auscultation bilaterally BACK:  No CVA tenderness CHEST:  Unremarkable HEART:  PMI not displaced or sustained,S1 and S2 within normal limits, no S3, no S4, no clicks, no rubs, no murmurs ABD:  Flat, positive bowel sounds normal in frequency in pitch, no bruits, no rebound, no guarding, no midline pulsatile mass, no hepatomegaly, no splenomegaly EXT:  2 plus pulses throughout, no edema, no cyanosis no clubbing SKIN:  No rashes no nodules NEURO:  Cranial nerves II through XII grossly intact, motor grossly intact throughout PSYCH:  Cognitively intact, oriented to person place and time    EKG:  EKG is not ordered today. NA   Recent Labs: 05/17/2017: ALT 20 01/03/2018: BUN 11; Creatinine, Ser 0.93; Hemoglobin 15.2; Magnesium 2.2; Platelets 202; Potassium 3.9; Sodium 138    Lipid Panel    Component Value Date/Time   CHOL 362 (H) 01/02/2018 0238   CHOL 337 (H) 12/18/2017 1000   TRIG 1,384 (H) 01/02/2018 0238   HDL 29 (L) 01/02/2018 0238   HDL 23 (L) 12/18/2017 1000   CHOLHDL NOT REPORTED DUE TO HIGH TRIGLYCERIDES 01/02/2018 0238   VLDL UNABLE TO CALCULATE IF TRIGLYCERIDE OVER 400 mg/dL 01/02/2018 0238   LDLCALC UNABLE TO CALCULATE IF TRIGLYCERIDE OVER 400 mg/dL 01/02/2018 0238   LDLCALC Comment 12/18/2017 1000      Wt Readings from Last 3 Encounters:  01/31/18 227 lb 6.4 oz (103.1 kg)  01/24/18 229 lb 11.2 oz (104.2 kg)  01/03/18 229 lb 11.2 oz (104.2 kg)      Other studies Reviewed: Additional studies/ records that were reviewed today include: Hospital records, CTA. Review of the above records demonstrates:  Please see elsewhere in the note.     ASSESSMENT AND PLAN:  CAD:  Nonobstructive.   We are going to pursue aggressive risk  reduction.  He is a nitroglycerin patch.  He would like to be on pills and I will switch him to Imdur 120 mg  daily.  DYSLIPIDEMIA.  He was started on fenofibrate in the hospital.   His triglycerides were as above.  They can be followed now going forward by his primary physician and is also going to see an endocrinologist.  DM:  A1c was 9.1.  We talked about the importance of better glucose control and he is been referred to endocrinologist.  TOBACCO ABUSE: We will give him information about 1 800 quit now.  He has cut back to three quarters of a pack per day.  He understands complete abstinence and he needs a quit date.  SLEEP APNEA:  I reviewed these results for this admission.  He had mild sleep apnea was diagnosed and it was suggested that he be treated conservatively.    HTN: I would like him to get a blood pressure cuff or to keep a blood pressure diary at home.  His blood pressure is borderline but needs no med changes at this point.  Current medicines are reviewed at length with the patient today.  The patient does not have concerns regarding medicines.  The following changes have been made:  no change  Labs/ tests ordered today include: None No orders of the defined types were placed in this encounter.    Disposition:   FU with me in 12 months.     Signed, Minus Breeding, MD  01/31/2018 9:40 AM    [place]

## 2018-01-31 ENCOUNTER — Encounter: Payer: Self-pay | Admitting: Cardiology

## 2018-01-31 ENCOUNTER — Ambulatory Visit (INDEPENDENT_AMBULATORY_CARE_PROVIDER_SITE_OTHER): Payer: 59 | Admitting: Cardiology

## 2018-01-31 VITALS — BP 139/80 | HR 81 | Ht 72.0 in | Wt 227.4 lb

## 2018-01-31 DIAGNOSIS — Z720 Tobacco use: Secondary | ICD-10-CM | POA: Diagnosis not present

## 2018-01-31 DIAGNOSIS — I2510 Atherosclerotic heart disease of native coronary artery without angina pectoris: Secondary | ICD-10-CM

## 2018-01-31 DIAGNOSIS — E785 Hyperlipidemia, unspecified: Secondary | ICD-10-CM

## 2018-01-31 DIAGNOSIS — Z794 Long term (current) use of insulin: Secondary | ICD-10-CM

## 2018-01-31 DIAGNOSIS — E118 Type 2 diabetes mellitus with unspecified complications: Secondary | ICD-10-CM | POA: Diagnosis not present

## 2018-01-31 MED ORDER — ISOSORBIDE MONONITRATE ER 120 MG PO TB24: 120.0000 mg | ORAL_TABLET | Freq: Every day | ORAL | 3 refills | Status: DC

## 2018-01-31 NOTE — Patient Instructions (Signed)
Medication Instructions:  STOP- Nitro Patch INCREASE- Isosorbide 120 mg daily  If you need a refill on your cardiac medications before your next appointment, please call your pharmacy.  Labwork: None Ordered   If you have labs (blood work) drawn today and your tests are completely normal, you will receive your results only by: Marland Kitchen MyChart Message (if you have MyChart) OR . A paper copy in the mail If you have any lab test that is abnormal or we need to change your treatment, we will call you to review the results.  Testing/Procedures: None Ordered  Follow-Up: You will need a follow up appointment in 1 Year.  Please call our office 2 months in advance([phone]) to schedule the (1 Year) appointment.  You may see  DR Percival Spanish, or one of the following Advanced Practice Providers on your designated Care Team:    . Jory Sims, DNP, ANP . Rhonda Barrett, PA-C  . Kerin Ransom, [place]  . Almyra Deforest, PA-C . Fabian Sharp, PA-C  At [facility], you and your health needs are our priority.  As part of our continuing mission to provide you with exceptional heart care, we have created designated Provider Care Teams.  These Care Teams include your primary Cardiologist (physician) and Advanced Practice Providers (APPs -  Physician Assistants and Nurse Practitioners) who all work together to provide you with the care you need, when you need it.   Thank you for choosing [facility] at [facility]!!      [phone]- [phone]

## 2018-02-01 ENCOUNTER — Ambulatory Visit (INDEPENDENT_AMBULATORY_CARE_PROVIDER_SITE_OTHER): Payer: 59 | Admitting: Neurology

## 2018-02-01 ENCOUNTER — Encounter

## 2018-02-01 ENCOUNTER — Ambulatory Visit (INDEPENDENT_AMBULATORY_CARE_PROVIDER_SITE_OTHER): Payer: Medicare Other | Admitting: Neurology

## 2018-02-01 DIAGNOSIS — Z0289 Encounter for other administrative examinations: Secondary | ICD-10-CM

## 2018-02-01 DIAGNOSIS — E1143 Type 2 diabetes mellitus with diabetic autonomic (poly)neuropathy: Secondary | ICD-10-CM

## 2018-02-01 DIAGNOSIS — E1142 Type 2 diabetes mellitus with diabetic polyneuropathy: Secondary | ICD-10-CM

## 2018-02-01 NOTE — Progress Notes (Signed)
EMG/NCS under procedure

## 2018-02-01 NOTE — Procedures (Signed)
Full Name: Vincent [name] Gender: Male MRN #: [id number] Date of Birth: [birth year]    Visit Date: 02/01/2018 09:08 Age: 52 Years 84 Months Old Examining Physician: Marcial Pacas, MD  Referring Physician: Dr. Steva Ready History: 52 years old male, with history of uncontrolled diabetes for more than 20 years, presenting with gradual onset bilateral lower extremity paresthesia since 2010, starting from plantar surface, ascending to bilateral knee level now, also has intermittent bilateral hands paresthesia, complains of unsteady gait, has nonhealing ulcer at the left toe.  On examination: There was no significant muscle weakness.  Length dependent decreased vibratory sensation light touch and pinprick to mid shin level.  Deep tendon reflex were absent at bilateral patella and Achilles.  Summary of the tests:  Nerve conduction study: Bilateral superficial peroneal sensory responses were absent.  Bilateral sural sensory responses showed moderately decreased to snap amplitude.  Right ulnar sensory response was absent.  Right radial sensory response showed mildly prolonged peak latency with moderately decreased to snap amplitude.  Bilateral tibial, left peroneal motor response showed moderately decreased the C map amplitude, with moderate slow conduction velocity.  Right peroneal to EDB motor response showed normal C map amplitude, with moderate slow conduction velocity.  Right ulnar motor responses were normal.  Electromyography: Selective needle examinations of right upper and lower extremity, right cervical, and lumbosacral paraspinal muscle was performed.  There is only mild enlarged motor unit potential, with mildly decreased recruitment noted at right abductor hallucis, there is no other significant abnormality.  Conclusion:  This is an abnormal study.  There is electrodiagnostic evidence of length dependent moderate axonal sensorimotor neuropathy consistent with his history of  poorly controlled diabetes.  There is no evidence of right cervical radiculopathy or right lumbosacral radiculopathy.   ------------------------------- Marcial Pacas, M.D. PhD  [facility] [place], [place] [address] Tel: [phone] Fax: [phone]        [facility]    Nerve / Sites Muscle Latency Ref. Amplitude Ref. Rel Amp Segments Distance Velocity Ref. Area    ms ms mV mV %  cm m/s m/s mVms  R Ulnar - ADM     Wrist ADM 3.1 ?3.3 7.3 ?6.0 100 Wrist - ADM 7   19.5     B.Elbow ADM 9.0  6.1  83.5 B.Elbow - Wrist 24 41 ?49 17.8     A.Elbow ADM 12.2  5.3  86.5 A.Elbow - B.Elbow 10 31 ?49 16.7         A.Elbow - Wrist      R Peroneal - EDB     Ankle EDB 5.8 ?6.5 2.4 ?2.0 100 Ankle - EDB 9   8.0     Fib head EDB 15.8  2.3  95.2 Fib head - Ankle 33 33 ?44 7.4     Pop fossa EDB 18.5  2.3  99.7 Pop fossa - Fib head 10 36 ?44 7.4         Pop fossa - Ankle      L Peroneal - EDB     Ankle EDB 7.0 ?6.5 1.1 ?2.0 100 Ankle - EDB 9   3.5     Fib head EDB 17.3  0.9  86.9 Fib head - Ankle 33 32 ?44 2.3     Pop fossa EDB 24.2  0.3  29.1 Pop fossa - Fib head 12 17 ?44 1.1         Pop fossa - Ankle  R Tibial - AH     Ankle AH 4.5 ?5.8 3.0 ?4.0 100 Ankle - AH 9   9.2     Pop fossa AH 17.3  2.2  72.6 Pop fossa - Ankle 42 33 ?41 6.6  L Tibial - AH     Ankle AH 5.1 ?5.8 1.7 ?4.0 100 Ankle - AH 9   5.7     Pop fossa AH 18.8  1.3  75.5 Pop fossa - Ankle 43 32 ?41 4.0               SNC    Nerve / Sites Rec. Site Peak Lat Ref.  Amp Ref. Segments Distance    ms ms V V  cm  R Radial - Anatomical snuff box (Forearm)     Forearm Wrist 3.0 ?2.9 8 ?15 Forearm - Wrist 10  R Sural - Ankle (Calf)     Calf Ankle 5.5 ?4.4 2 ?6 Calf - Ankle 14  L Sural - Ankle (Calf)     Calf Ankle 5.8 ?4.4 2 ?6 Calf - Ankle 14  R Superficial peroneal - Ankle     Lat leg Ankle NR ?4.4 NR ?6 Lat leg - Ankle 14  L Superficial peroneal - Ankle     Lat leg Ankle NR ?4.4 NR ?6 Lat leg - Ankle 14  R  Ulnar - Orthodromic, (Dig V, Mid palm)     Dig V Wrist NR ?3.1 NR ?5 Dig V - Wrist 56                 F  Wave    Nerve F Lat Ref.   ms ms  R Tibial - AH 66.5 ?56.0  L Tibial - AH 69.1 ?56.0  R Ulnar - ADM 26.6 ?32.0           EMG full       EMG Summary Table    Spontaneous MUAP Recruitment  Muscle IA Fib PSW Fasc Other Amp Dur. Poly Pattern  R. Abductor hallucis Increased None None None _______ Normal Increased 1+ Reduced  R. Tibialis anterior Normal None None None _______ Normal Normal Normal Normal  R. Gastrocnemius (Medial head) Normal None None None _______ Normal Normal Normal Normal  R. Vastus lateralis Normal None None None _______ Normal Normal Normal Normal  R. Lumbar paraspinals (mid) Normal None None None _______ Normal Normal Normal Normal  R. Lumbar paraspinals (low) Normal None None None _______ Normal Normal Normal Normal  R. First dorsal interosseous Normal None None None _______ Normal Normal Normal Normal  R. Pronator teres Normal None None None _______ Normal Normal Normal Normal  R. Biceps brachii Normal None None None _______ Normal Normal Normal Normal  R. Triceps brachii Normal None None None _______ Normal Normal Normal Normal  R. Cervical paraspinals Normal None None None _______ Normal Normal Normal Normal

## 2018-02-11 ENCOUNTER — Ambulatory Visit (INDEPENDENT_AMBULATORY_CARE_PROVIDER_SITE_OTHER): Payer: Medicare Other | Admitting: Orthopedic Surgery

## 2018-03-16 ENCOUNTER — Ambulatory Visit (HOSPITAL_COMMUNITY)
Admission: EM | Admit: 2018-03-16 | Discharge: 2018-03-16 | Disposition: A | Discharge status: Home / Self Care | Payer: Worker's Compensation | Attending: Family Medicine | Admitting: Family Medicine

## 2018-03-16 ENCOUNTER — Encounter (HOSPITAL_COMMUNITY): Payer: Self-pay

## 2018-03-16 ENCOUNTER — Other Ambulatory Visit: Payer: Self-pay

## 2018-03-16 DIAGNOSIS — S39012A Strain of muscle, fascia and tendon of lower back, initial encounter: Secondary | ICD-10-CM | POA: Insufficient documentation

## 2018-03-16 MED ORDER — CYCLOBENZAPRINE HCL 10 MG PO TABS: ORAL_TABLET | ORAL | 0 refills | Status: DC

## 2018-03-16 NOTE — ED Triage Notes (Signed)
Pt states she was moving some trash at work. Pt thinks he move the wrong way and felt a muscle pull in his back. This happened today about 2:00 pm.

## 2018-03-18 NOTE — ED Provider Notes (Signed)
[place]   [id number] 03/16/18 Arrival Time: 3762  ASSESSMENT & PLAN:  1. Strain of lumbar region, initial encounter    Without trauma, there is no indication for urgent imaging of back at this time. Normal neurological exam. Suspect this is a completely muscular problem. Discussed.  Meds ordered this encounter  Medications  . cyclobenzaprine (FLEXERIL) 10 MG tablet    Sig: Take 1 tablet by mouth 3 times daily as needed for muscle spasm. Warning: May cause drowsiness.    Dispense:  21 tablet    Refill:  0   Using Fentanyl patches for chronic pain. Muscle relaxer sedation precautions given. Encourage ROM/movement as tolerated.  To call [facility] on Monday morning to arrange prompt f/u. Number given.  Reviewed expectations re: course of current medical issues. Questions answered. Outlined signs and symptoms indicating need for more acute intervention. Patient verbalized understanding. After Visit Summary given.   SUBJECTIVE: History from: patient.  Vincent [name] is a 53 y.o. male who presents with complaint of persistent right sided lower back discomfort. Onset abrupt, today. Injury/trama: yes, reports being at work and twisting while throwing the garbage out. Immediately "felt a muscle or something pull in my back". Sore since. History of back problems: yes; pain with chronic pain syndrome. Discomfort described as aching without radiation. Certain movements exacerbate the described discomfort. Better with rest. Extremity sensation changes or weakness: none. Ambulatory without difficulty. Normal bowel/bladder habits: yes. No associated abdominal pain/n/v. Self treatment: has tried nothing OTC for pain relief.  Reports no fevers, IV drug use, or recent back surgeries or procedures.  ROS: As per HPI. All other systems negative.    OBJECTIVE:  Vitals:   03/16/18 1803 03/16/18 1805  BP:  136/88  Pulse:  93  Resp:  18  Temp:  98.2 F (36.8 C)    SpO2:  100%  Weight: 104.3 kg     General appearance: alert; no distress; moves around room without obvious difficulty Neck: supple with FROM; without midline tenderness CV: RRR Lungs: unlabored respirations; symmetrical air entry Abdomen: soft, non-tender; non-distended Back: mild to moderate right sided tenderness of his lower paraspinal musculature; FROM at waist; bruising: none; without midline tenderness Extremities: no edema; symmetrical with no gross deformities; normal ROM of bilateral lower extremities Skin: warm and dry Neurologic: normal gait; normal reflexes of RLE and LLE; normal sensation of RLE and LLE; normal strength of RLE and LLE Psychological: alert and cooperative; normal mood and affect  Allergies  Allergen Reactions  . Celebrex [Celecoxib] Anaphylaxis and Rash  . Hydrocodone Rash and Other (See Comments)    "blisters developed on arms"  . Sulfa Antibiotics Rash    Past Medical History:  Diagnosis Date  . Anxiety   . Arthritis   . Bipolar 1 disorder ([place])   . CAD (coronary artery disease)    Non obstructive on CTA Oct 2019.   Marland Kitchen Chronic pain syndrome    back  . COPD (chronic obstructive pulmonary disease) ([place])   . Gastroparesis   . GERD (gastroesophageal reflux disease)   . Hiatal hernia   . History of bladder cancer urologist-  dr Consuella Lose   papillay TCC (Ta G1)  s/p TURBT and chemo instillation 2014  . History of encephalopathy 05/27/2015   admission w/ acute encephalopathy thought to be secondary to pain meds and COPD  . History of gastric ulcer   . History of kidney stones   . History of TIA (transient ischemic attack) 2008  no residual  . History of traumatic head injury 2010   w/ LOC  per pt needed stitches  . Hyperlipidemia   . Hypothyroidism   . Insomnia    per sleep study 04-19-2015 without sleep apnea  . Neuropathy in diabetes ([place])    LOWER EXTREMITIES  . Seizures, transient [facility]) neurologist-  dr Krista Blue--  differential dx complex  partial seizure .vs.  mood disorder .vs.  pseudoseizure--  negative EEG's   confusion episodes and staring spells since 11/ 2015  . Transient confusion NEUOROLOGIST-  DR YAN   Episodes since 11/ 2015--  neurologist dx  differential complex partial seizure  .vs. mood disorder . vs. pseudoseizure  . Type 2 diabetes mellitus treated with insulin (HCC)    Social History   Socioeconomic History  . Marital status: Married    Spouse name: Not on file  . Number of children: 3  . Years of education: GED  . Highest education level: Not on file  Occupational History  . Occupation: Engineer, technical sales    Comment: Owner of company  Social Needs  . Financial resource strain: Not on file  . Food insecurity:    Worry: Not on file    Inability: Not on file  . Transportation needs:    Medical: Not on file    Non-medical: Not on file  Tobacco Use  . Smoking status: Current Every Day Smoker    Packs/day: 1.00    Years: 25.00    Pack years: 25.00    Types: Cigarettes  . Smokeless tobacco: Never Used  . Tobacco comment: Has cut back to 1 pack a day  Substance and Sexual Activity  . Alcohol use: No  . Drug use: No  . Sexual activity: Yes    Partners: Female    Birth control/protection: None  Lifestyle  . Physical activity:    Days per week: Not on file    Minutes per session: Not on file  . Stress: Not on file  Relationships  . Social connections:    Talks on phone: Not on file    Gets together: Not on file    Attends religious service: Not on file    Active member of club or organization: Not on file    Attends meetings of clubs or organizations: Not on file    Relationship status: Not on file  . Intimate partner violence:    Fear of current or ex partner: Not on file    Emotionally abused: Not on file    Physically abused: Not on file    Forced sexual activity: Not on file  Other Topics Concern  . Not on file  Social History Narrative   Lives at home with his wife and  children.   Left-handed.   3-4 cups caffeine per day.   Family History  Problem Relation Age of Onset  . Diabetes Mother   . Diabetes Father   . Hypertension Father   . Heart attack Father 31       died age 71  . Alcohol abuse Father    Past Surgical History:  Procedure Laterality Date  . CARDIAC CATHETERIZATION  12-27-2001  DR Einar Gip  &  05-26-2009  DR Irish Lack   RESULTS FOR BOTH ARE NORMAL CORONARIES AND PERSERVED LVF/ EF 60%  . CARPAL TUNNEL RELEASE Right 09-16-2003  . CARPAL TUNNEL RELEASE Left 02/25/2015   Procedure: LEFT CARPAL TUNNEL RELEASE;  Surgeon: Leanora Cover, MD;  Location: [place];  Service:  Orthopedics;  Laterality: Left;  . CYSTOSCOPY N/A 10/10/2012   Procedure: CYSTOSCOPY CLOT EVACUATION FULGERATION OF BLEEDERS ;  Surgeon: Claybon Jabs, MD;  Location: [facility];  Service: Urology;  Laterality: N/A;  . CYSTOSCOPY WITH BIOPSY N/A 11/26/2015   Procedure: CYSTOSCOPY WITH BIOPSY AND FULGURATION;  Surgeon: Kathie Rhodes, MD;  Location: [place];  Service: Urology;  Laterality: N/A;  . LAPAROSCOPIC CHOLECYSTECTOMY  11-17-2010  . NEGATIVE SLEEP STUDY  04-19-2015  in epic  . ORCHIECTOMY Right 02/21/2016   Procedure: SCROTAL ORCHIECTOMY with TESTICULAR PROSTHESIS IMPLANT;  Surgeon: Kathie Rhodes, MD;  Location: [facility];  Service: Urology;  Laterality: Right;  . ROTATOR CUFF REPAIR Right 12/2004  . TRANSURETHRAL RESECTION OF BLADDER TUMOR N/A 08/09/2012   Procedure: TRANSURETHRAL RESECTION OF BLADDER TUMOR (TURBT) WITH GYRUS WITH MITOMYCIN C;  Surgeon: Claybon Jabs, MD;  Location: [facility];  Service: Urology;  Laterality: N/A;  . TRANSURETHRAL RESECTION OF BLADDER TUMOR WITH GYRUS (TURBT-GYRUS) N/A 02/27/2014   Procedure: TRANSURETHRAL RESECTION OF BLADDER TUMOR WITH GYRUS (TURBT-GYRUS);  Surgeon: Claybon Jabs, MD;  Location: [facility];  Service: Urology;  Laterality: N/AVanessa Kick, MD 03/18/18 1404

## 2018-03-21 ENCOUNTER — Ambulatory Visit (INDEPENDENT_AMBULATORY_CARE_PROVIDER_SITE_OTHER): Payer: No Typology Code available for payment source | Admitting: Psychiatry

## 2018-03-21 ENCOUNTER — Encounter (HOSPITAL_COMMUNITY): Payer: Self-pay | Admitting: Psychiatry

## 2018-03-21 DIAGNOSIS — F3181 Bipolar II disorder: Secondary | ICD-10-CM | POA: Diagnosis not present

## 2018-03-21 DIAGNOSIS — F4312 Post-traumatic stress disorder, chronic: Secondary | ICD-10-CM

## 2018-03-21 DIAGNOSIS — F411 Generalized anxiety disorder: Secondary | ICD-10-CM

## 2018-03-21 MED ORDER — HYDROXYZINE PAMOATE 25 MG PO CAPS: 25.0000 mg | ORAL_CAPSULE | Freq: Three times a day (TID) | ORAL | 1 refills | Status: DC

## 2018-03-21 MED ORDER — MIRTAZAPINE 15 MG PO TABS: 15.0000 mg | ORAL_TABLET | Freq: Every day | ORAL | 1 refills | Status: DC

## 2018-03-21 MED ORDER — ARIPIPRAZOLE 5 MG PO TABS: 5.0000 mg | ORAL_TABLET | Freq: Every day | ORAL | 1 refills | Status: DC

## 2018-03-21 MED ORDER — LAMOTRIGINE 150 MG PO TABS: 150.0000 mg | ORAL_TABLET | Freq: Two times a day (BID) | ORAL | 1 refills | Status: DC

## 2018-03-21 MED ORDER — VENLAFAXINE HCL ER 150 MG PO CP24: 150.0000 mg | ORAL_CAPSULE | Freq: Every day | ORAL | 0 refills | Status: DC

## 2018-03-21 NOTE — Progress Notes (Signed)
[place] MD/PA/NP OP Progress Note  03/21/2018 8:46 AM NAMEER SUMMER  MRN:  [id number]  Chief Complaint: My mood is much better since I increase the Lamictal but I have trouble sleeping.  I have also tremors in my hand.  HPI: Jedidiah came for his follow appointment.  He is a 53 year old Caucasian married employed man who has been seeing Dr. Daron Offer who left the practice.  On his last visit we had increased his Lamictal to help his mood lability.  He admitted that increase Lamictal help his mood swing anger and nightmares.  However he still had trouble with insomnia.  He used to take Xanax however it was discontinued because his pain specialist refused to give fentanyl with Xanax.  He is compliant with Abilify, Effexor, [name] but he is also taking Lyrica, Neurontin.  We had increase the Lamictal which he was getting from other provider.  He has no rash, itching, tremors or shakes.  Recently he seen neurology for EMG and nerve conduction.  He has neuropathy due to poorly controlled diabetes.  His last hemoglobin A1c was 9.1.  Patient is pleased that his 65 year old daughter now working and like to restart school.  She is seeing Janett Billow for therapy.  Patient denies any paranoia, hallucination, suicidal thoughts or homicidal thought.  He denies any severe anger or aggressive behavior but continues to struggle some time with his mood.  He lives with his wife and 13 year old daughter.  He has been married for 18 years.  He denies drinking or using any illegal substances though he has significant history of drug use in the past.  We also started him on hydroxyzine to help his anxiety which help him most of the time.  He started watching his calorie intake and he has lost more than 8 pounds since the last visit.  Visit Diagnosis:    ICD-10-CM   1. Bipolar 2 disorder (HCC) F31.81 lamoTRIgine (LAMICTAL) 150 MG tablet    ARIPiprazole (ABILIFY) 5 MG tablet  2. Chronic post-traumatic stress disorder (PTSD) F43.12  mirtazapine ([name]) 15 MG tablet    venlafaxine XR (EFFEXOR-XR) 150 MG 24 hr capsule  3. GAD (generalized anxiety disorder) F41.1 hydrOXYzine (VISTARIL) 25 MG capsule    Past Psychiatric History: Reviewed. History of one brief hospitalization at [facility] due to suicidal thoughts.  No history of suicidal attempt.  History of anger, anxiety, PTSD and heavy substance use.  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  Tried Depakote, Zoloft, lithium, Prozac and Xanax.  Past Medical History:  Past Medical History:  Diagnosis Date  . Anxiety   . Arthritis   . Bipolar 1 disorder ([place])   . CAD (coronary artery disease)    Non obstructive on CTA Oct 2019.   Marland Kitchen Chronic pain syndrome    back  . COPD (chronic obstructive pulmonary disease) ([place])   . Gastroparesis   . GERD (gastroesophageal reflux disease)   . Hiatal hernia   . History of bladder cancer urologist-  dr Consuella Lose   papillay TCC (Ta G1)  s/p TURBT and chemo instillation 2014  . History of encephalopathy 05/27/2015   admission w/ acute encephalopathy thought to be secondary to pain meds and COPD  . History of gastric ulcer   . History of kidney stones   . History of TIA (transient ischemic attack) 2008    no residual  . History of traumatic head injury 2010   w/ LOC  per pt needed stitches  . Hyperlipidemia   .  Hypothyroidism   . Insomnia    per sleep study 04-19-2015 without sleep apnea  . Neuropathy in diabetes ([place])    LOWER EXTREMITIES  . Seizures, transient [facility]) neurologist-  dr Krista Blue--  differential dx complex partial seizure .vs.  mood disorder .vs.  pseudoseizure--  negative EEG's   confusion episodes and staring spells since 11/ 2015  . Transient confusion NEUOROLOGIST-  DR YAN   Episodes since 11/ 2015--  neurologist dx  differential complex partial seizure  .vs. mood disorder . vs. pseudoseizure  . Type 2 diabetes mellitus treated with insulin [facility])     Past Surgical History:  Procedure Laterality Date   . CARDIAC CATHETERIZATION  12-27-2001  DR Einar Gip  &  05-26-2009  DR Irish Lack   RESULTS FOR BOTH ARE NORMAL CORONARIES AND PERSERVED LVF/ EF 60%  . CARPAL TUNNEL RELEASE Right 09-16-2003  . CARPAL TUNNEL RELEASE Left 02/25/2015   Procedure: LEFT CARPAL TUNNEL RELEASE;  Surgeon: Leanora Cover, MD;  Location: [place];  Service: Orthopedics;  Laterality: Left;  . CYSTOSCOPY N/A 10/10/2012   Procedure: CYSTOSCOPY CLOT EVACUATION FULGERATION OF BLEEDERS ;  Surgeon: Claybon Jabs, MD;  Location: [facility];  Service: Urology;  Laterality: N/A;  . CYSTOSCOPY WITH BIOPSY N/A 11/26/2015   Procedure: CYSTOSCOPY WITH BIOPSY AND FULGURATION;  Surgeon: Kathie Rhodes, MD;  Location: [place];  Service: Urology;  Laterality: N/A;  . LAPAROSCOPIC CHOLECYSTECTOMY  11-17-2010  . NEGATIVE SLEEP STUDY  04-19-2015  in epic  . ORCHIECTOMY Right 02/21/2016   Procedure: SCROTAL ORCHIECTOMY with TESTICULAR PROSTHESIS IMPLANT;  Surgeon: Kathie Rhodes, MD;  Location: [facility];  Service: Urology;  Laterality: Right;  . ROTATOR CUFF REPAIR Right 12/2004  . TRANSURETHRAL RESECTION OF BLADDER TUMOR N/A 08/09/2012   Procedure: TRANSURETHRAL RESECTION OF BLADDER TUMOR (TURBT) WITH GYRUS WITH MITOMYCIN C;  Surgeon: Claybon Jabs, MD;  Location: [facility];  Service: Urology;  Laterality: N/A;  . TRANSURETHRAL RESECTION OF BLADDER TUMOR WITH GYRUS (TURBT-GYRUS) N/A 02/27/2014   Procedure: TRANSURETHRAL RESECTION OF BLADDER TUMOR WITH GYRUS (TURBT-GYRUS);  Surgeon: Claybon Jabs, MD;  Location: [facility];  Service: Urology;  Laterality: N/A;    Family Psychiatric History: Reviewed.  Family History:  Family History  Problem Relation Age of Onset  . Diabetes Mother   . Diabetes Father   . Hypertension Father   . Heart attack Father 28       died age 65  . Alcohol abuse Father     Social History:  Social History    Socioeconomic History  . Marital status: Married    Spouse name: Not on file  . Number of children: 3  . Years of education: GED  . Highest education level: Not on file  Occupational History  . Occupation: Engineer, technical sales    Comment: Owner of company  Social Needs  . Financial resource strain: Not on file  . Food insecurity:    Worry: Not on file    Inability: Not on file  . Transportation needs:    Medical: Not on file    Non-medical: Not on file  Tobacco Use  . Smoking status: Current Every Day Smoker    Packs/day: 1.00    Years: 25.00    Pack years: 25.00    Types: Cigarettes  . Smokeless tobacco: Never Used  . Tobacco comment: Has cut back to 1 pack a day  Substance and Sexual Activity  .  Alcohol use: No  . Drug use: No  . Sexual activity: Not Currently    Partners: Female    Birth control/protection: None  Lifestyle  . Physical activity:    Days per week: Not on file    Minutes per session: Not on file  . Stress: Not on file  Relationships  . Social connections:    Talks on phone: Not on file    Gets together: Not on file    Attends religious service: Not on file    Active member of club or organization: Not on file    Attends meetings of clubs or organizations: Not on file    Relationship status: Not on file  Other Topics Concern  . Not on file  Social History Narrative   Lives at home with his wife and children.   Left-handed.   3-4 cups caffeine per day.    Allergies:  Allergies  Allergen Reactions  . Celebrex [Celecoxib] Anaphylaxis and Rash  . Hydrocodone Rash and Other (See Comments)    "blisters developed on arms"  . Sulfa Antibiotics Rash    Metabolic Disorder Labs: Lab Results  Component Value Date   HGBA1C 9.1 (H) 12/18/2017   MPG 214 05/06/2016   MPG 166 (H) 03/06/2014   No results found for: PROLACTIN Lab Results  Component Value Date   CHOL 362 (H) 01/02/2018   TRIG 1,384 (H) 01/02/2018   HDL 29 (L) 01/02/2018    CHOLHDL NOT REPORTED DUE TO HIGH TRIGLYCERIDES 01/02/2018   VLDL UNABLE TO CALCULATE IF TRIGLYCERIDE OVER 400 mg/dL 01/02/2018   LDLCALC UNABLE TO CALCULATE IF TRIGLYCERIDE OVER 400 mg/dL 01/02/2018   [place] Comment 12/18/2017   Lab Results  Component Value Date   TSH 3.735 05/06/2016   TSH 3.225 03/05/2014    Therapeutic Level Labs: Lab Results  Component Value Date   LITHIUM 0.35 (L) 05/28/2015   Lab Results  Component Value Date   VALPROATE 17 (L) 05/06/2016   No components found for:  CBMZ  Current Medications: Current Outpatient Medications  Medication Sig Dispense Refill  . ARIPiprazole (ABILIFY) 10 MG tablet Take 1 tablet (10 mg total) by mouth daily. 90 tablet 0  . atorvastatin (LIPITOR) 40 MG tablet Take 1 tablet (40 mg total) by mouth daily at 6 PM. 30 tablet 1  . Cholecalciferol (VITAMIN D3) 2000 units TABS TAKE 1 TABLET BY MOUTH ONCE DAILY (Patient taking differently: Take 2,000 Units by mouth daily. ) 90 tablet 0  . cyclobenzaprine (FLEXERIL) 10 MG tablet Take 1 tablet by mouth 3 times daily as needed for muscle spasm. Warning: May cause drowsiness. 21 tablet 0  . doxycycline (VIBRAMYCIN) 100 MG capsule Take 100 mg by mouth 2 (two) times daily.  0  . Dulaglutide (TRULICITY) 1.54 MG/8.6PY SOPN Inject 1 pen into the skin once a week.    Noelle Penner ASPIRIN ADULT LOW DOSE 81 MG EC tablet Take 81 mg by mouth daily.  0  . fenofibrate 160 MG tablet Take 1 tablet (160 mg total) by mouth daily. 30 tablet 0  . fentaNYL (DURAGESIC - DOSED MCG/HR) 75 MCG/HR Place 75 mcg onto the skin every 3 (three) days.     Marland Kitchen gabapentin (NEURONTIN) 600 MG tablet Take 2 tablets (1,200 mg total) by mouth 2 (two) times daily. 360 tablet 0  . hydroxychloroquine (PLAQUENIL) 200 MG tablet Take 200 mg by mouth 2 (two) times daily.     . hydrOXYzine (VISTARIL) 25 MG capsule Take 1 capsule (25  mg total) by mouth 2 (two) times daily. 60 capsule 1  . Insulin Glargine (BASAGLAR KWIKPEN) 100 UNIT/ML SOPN  Inject 40 Units into the skin 2 (two) times daily.  11  . insulin lispro (HUMALOG) 100 UNIT/ML injection Inject 15 Units into the skin 2 (two) times daily.     . isosorbide mononitrate (IMDUR) 120 MG 24 hr tablet Take 1 tablet (120 mg total) by mouth daily. 90 tablet 3  . lamoTRIgine (LAMICTAL) 100 MG tablet Take one tab daily, 1/2 in afternoon and one tab at bed time. 90 tablet 1  . levothyroxine (SYNTHROID, LEVOTHROID) 25 MCG tablet Take 25 mcg by mouth daily before breakfast.     . Methotrexate, PF, (RASUVO) 25 MG/0.5ML SOAJ Inject 25 mg into the skin every 7 (seven) days.    . metoCLOPramide (REGLAN) 10 MG tablet Take 10 mg by mouth daily.  11  . metoprolol tartrate (LOPRESSOR) 25 MG tablet Take 1 tablet (25 mg total) by mouth 2 (two) times daily. 60 tablet 0  . mirtazapine ([name]) 15 MG tablet Take 1 tablet (15 mg total) by mouth at bedtime. 90 tablet 0  . Multiple Vitamins-Minerals (MULTIVITAMIN PO) Take 1 tablet by mouth daily.    . mupirocin ointment (BACTROBAN) 2 % Apply 1 application topically 2 (two) times daily. Apply to the affected area 2 times a day 22 g 3  . omeprazole (PRILOSEC) 20 MG capsule Take 20 mg by mouth daily.     . ondansetron (ZOFRAN-ODT) 4 MG disintegrating tablet Take 1 tablet (4 mg total) by mouth every 8 (eight) hours as needed for nausea or vomiting. 15 tablet 0  . pregabalin (LYRICA) 100 MG capsule Take 100 mg by mouth See admin instructions. Take 1 capsule every morning and take 2 capsules every night    . Prenatal Vit-Fe Fumarate-FA (PREPLUS) 27-1 MG TABS Take 1 tablet by mouth daily.  3  . tamsulosin (FLOMAX) 0.4 MG CAPS capsule TAKE 1 CAPSULE BY MOUTH IN THE EVENING  11  . venlafaxine XR (EFFEXOR-XR) 150 MG 24 hr capsule Take 2 capsules (300 mg total) by mouth daily with breakfast. 180 capsule 0   No current facility-administered medications for this visit.      Musculoskeletal: Strength & Muscle Tone: within normal limits Gait & Station:  normal Patient leans: N/A  Psychiatric Specialty Exam: Review of Systems  Constitutional: Positive for weight loss.  HENT: Negative.   Musculoskeletal: Positive for back pain and joint pain.  Skin: Negative.   Neurological: Positive for tremors.  Psychiatric/Behavioral: The patient is nervous/anxious and has insomnia.     Blood pressure 112/66, pulse 79, height 6' (1.829 m), weight 217 lb (98.4 kg), SpO2 95 %.Body mass index is 29.43 kg/m.  General Appearance: Casual  Eye Contact:  Good  Speech:  Clear and Coherent  Volume:  Normal  Mood:  Anxious  Affect:  Congruent  Thought Process:  Goal Directed  Orientation:  Full (Time, Place, and Person)  Thought Content: Logical   Suicidal Thoughts:  No  Homicidal Thoughts:  No  Memory:  Immediate;   Good Recent;   Good Remote;   Good  Judgement:  Good  Insight:  Good  Psychomotor Activity:  Tremor  Concentration:  Concentration: Fair and Attention Span: Fair  Recall:  Good  Fund of Knowledge: Good  Language: Good  Akathisia:  No  Handed:  Right  AIMS (if indicated): not done  Assets:  Communication Skills Desire for Improvement Resilience Social Teacher, English as a foreign language  ADL's:  Intact  Cognition: WNL  Sleep:  Poor   Screenings: AUDIT     Admission (Discharged) from 05/05/2016 in [place]  Alcohol Use Disorder Identification Test Final Score (AUDIT)  1    Mini-Mental     Office Visit from 11/04/2014 in [facility]  Total Score (max 30 points )  26    PHQ2-9     Office Visit from 03/24/2015 in [facility]  PHQ-2 Total Score  2  PHQ-9 Total Score  12       Assessment and Plan: Bipolar disorder type I.  Posttraumatic stress disorder.  Generalized anxiety disorder.  I review records from neurology.  He has neuropathy due to poorly controlled diabetes.  He had lost weight since the last visit.  He continues to struggle with insomnia but overall mood is  much better.  I recommended to try Lamictal 300 mg daily, increase hydroxyzine 25 mg 3 times a day to help his anxiety, insomnia and tremors.  He is not sure about Effexor if he is taking 1 a day or twice a day.  I will reduce Abilify 5 mg since we are increasing the Lamictal.  He has no rash, itching, tremors or shakes.  Continue [name] 15 mg at bedtime.  Encouraged to keep appointment with [name] which he has not done recently.  I advised him to review his AVS and make sure if the medicine is taking as prescribed.  The biggest question is about Effexor as he is not sure if he is taking twice a day.  Encourage healthy lifestyle and watch his calorie intake.  I will see him again in 2 months. Time spent 25 minutes.  More than 50% of the time spent in psychoeducation, counseling and coordination of care.  Discuss safety plan that anytime having active suicidal thoughts or homicidal thoughts then patient need to call 911 or go to the local emergency room.     Kathlee Nations, MD 03/21/2018, 8:46 AM

## 2018-03-25 ENCOUNTER — Ambulatory Visit (INDEPENDENT_AMBULATORY_CARE_PROVIDER_SITE_OTHER): Payer: Medicare Other | Admitting: Psychology

## 2018-03-25 DIAGNOSIS — F3181 Bipolar II disorder: Secondary | ICD-10-CM

## 2018-03-25 NOTE — Progress Notes (Signed)
   THERAPIST PROGRESS NOTE  Session Time: 8am-8.40am  Participation Level: Active  Behavioral Response: Well GroomedAlertaffect wnl  Type of Therapy: Individual Therapy  Treatment Goals addressed: Diagnosis: Bipolar 1 d/o and goal 1.  Interventions: CBT and Supportive  Summary: Vincent [name] is a 53 y.o. male who presents with affect wnl.  Pt reported that he has been doing fairly well recent w/ mood and stating that has been handling conflict and disagreement fairly well.  No major outbursts or conflicts.  Pt reported that disagreement between wife and he is about how much to do for granddaughter.  Wife watches during the day as she is working from home while the mom is at home not working.  Pt reported that he is concerned they are doing too much.  Pt reported that he has started on new medication for 2 days now- pt reports he has still struggled w/ sleep- only sleeping 4 hours a night and feels tired all the time.  Pt discussed his goals and plan updated.  .   Suicidal/Homicidal: Nowithout intent/plan  Therapist Response: Assessed pt current functioning per pt report.  Processed w/pt coping w/ interactions and disagreements.  Reiterated about assertive responses- acknowledging differences in opinions and views and deescalating for self.    Plan: Return again in 4 weeks.  Diagnosis: Bipolar 1 d/o    Beulah Matusek, LPC 03/25/2018

## 2018-04-02 DIAGNOSIS — M47816 Spondylosis without myelopathy or radiculopathy, lumbar region: Secondary | ICD-10-CM | POA: Diagnosis not present

## 2018-04-02 DIAGNOSIS — M5136 Other intervertebral disc degeneration, lumbar region: Secondary | ICD-10-CM | POA: Diagnosis not present

## 2018-04-02 DIAGNOSIS — M533 Sacrococcygeal disorders, not elsewhere classified: Secondary | ICD-10-CM | POA: Diagnosis not present

## 2018-04-02 DIAGNOSIS — E0849 Diabetes mellitus due to underlying condition with other diabetic neurological complication: Secondary | ICD-10-CM | POA: Diagnosis not present

## 2018-04-24 ENCOUNTER — Ambulatory Visit (HOSPITAL_COMMUNITY): Payer: Medicare Other | Admitting: Psychology

## 2018-04-24 DIAGNOSIS — Z8551 Personal history of malignant neoplasm of bladder: Secondary | ICD-10-CM | POA: Diagnosis not present

## 2018-04-24 DIAGNOSIS — N451 Epididymitis: Secondary | ICD-10-CM | POA: Diagnosis not present

## 2018-04-24 DIAGNOSIS — N401 Enlarged prostate with lower urinary tract symptoms: Secondary | ICD-10-CM | POA: Diagnosis not present

## 2018-04-24 DIAGNOSIS — R3911 Hesitancy of micturition: Secondary | ICD-10-CM | POA: Diagnosis not present

## 2018-05-01 DIAGNOSIS — N50812 Left testicular pain: Secondary | ICD-10-CM | POA: Diagnosis not present

## 2018-05-01 DIAGNOSIS — Z8551 Personal history of malignant neoplasm of bladder: Secondary | ICD-10-CM | POA: Diagnosis not present

## 2018-05-08 DIAGNOSIS — N50812 Left testicular pain: Secondary | ICD-10-CM | POA: Diagnosis not present

## 2018-05-10 DIAGNOSIS — M533 Sacrococcygeal disorders, not elsewhere classified: Secondary | ICD-10-CM | POA: Diagnosis not present

## 2018-05-13 ENCOUNTER — Encounter (HOSPITAL_COMMUNITY): Payer: Self-pay | Admitting: Psychiatry

## 2018-05-13 ENCOUNTER — Ambulatory Visit (INDEPENDENT_AMBULATORY_CARE_PROVIDER_SITE_OTHER): Payer: No Typology Code available for payment source | Admitting: Psychiatry

## 2018-05-13 DIAGNOSIS — F4312 Post-traumatic stress disorder, chronic: Secondary | ICD-10-CM | POA: Diagnosis not present

## 2018-05-13 DIAGNOSIS — F3181 Bipolar II disorder: Secondary | ICD-10-CM | POA: Diagnosis not present

## 2018-05-13 MED ORDER — MIRTAZAPINE 30 MG PO TABS: 30.0000 mg | ORAL_TABLET | Freq: Every day | ORAL | 1 refills | Status: DC

## 2018-05-13 MED ORDER — BUSPIRONE HCL 5 MG PO TABS: 5.0000 mg | ORAL_TABLET | Freq: Two times a day (BID) | ORAL | 1 refills | Status: DC

## 2018-05-13 MED ORDER — ARIPIPRAZOLE 15 MG PO TABS: ORAL_TABLET | ORAL | 1 refills | Status: DC

## 2018-05-13 MED ORDER — LAMOTRIGINE 150 MG PO TABS: 300.0000 mg | ORAL_TABLET | ORAL | 1 refills | Status: DC

## 2018-05-13 MED ORDER — VENLAFAXINE HCL ER 150 MG PO CP24: 300.0000 mg | ORAL_CAPSULE | Freq: Every day | ORAL | 1 refills | Status: DC

## 2018-05-13 NOTE — Progress Notes (Signed)
BH MD/PA/NP OP Progress Note  05/13/2018 8:47 AM Vincent [name]  MRN:  [id number]  Chief Complaint: I am not doing good.  I think I mess up my medication.  HPI: Vincent [name] came for his appointment.  He admitted feeling sad, depressed, lack of motivation and sometimes question about his job and his marriage.  He still have nightmares and flashback.  He is not sure what triggered but also realized that he may not be taking the medication as prescribed.  Today he brought the list of the medication but there is some discrepancy in the medication.  We have recommended to bring medication bottle on his last visit.  However today he brought the list which was written by his wife.  Apparently he is taking Effexor 300 mg in the morning, Abilify 15 mg daily though it was prescribed 5 mg but it is unclear who gave him 15 mg.  We have increase Lamictal to 300 mg on his last visit but he noticed it is causing insomnia.  He admitted racing thoughts and poor sleep.  He denies any aggression or any hallucination but admitted irritability, mood swings and feeling isolated.  He lives with his wife who is supportive.  He is working at [employer] and he denies any issues there.  He denies drinking or using any illegal substances.  He has diabetes and chronic pain.  He had a good response with low-dose Xanax but it was discontinued by his pain specialist because he was getting fentanyl patches.  Patient does not want his pain medicine to stop.  His energy level is fair.  Visit Diagnosis:    ICD-10-CM   1. Chronic post-traumatic stress disorder (PTSD) F43.12 mirtazapine ([name]) 30 MG tablet    venlafaxine XR (EFFEXOR-XR) 150 MG 24 hr capsule    busPIRone (BUSPAR) 5 MG tablet  2. Bipolar 2 disorder (HCC) F31.81 lamoTRIgine (LAMICTAL) 150 MG tablet    ARIPiprazole (ABILIFY) 15 MG tablet    Past Psychiatric History: Reviewed. H/O one brief hospitalization at [facility] due to suicidal thoughts.  No h/o suicidal  attempt.  H/O anger, anxiety, PTSD and heavy substance use.  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  Tried Depakote, Zoloft, lithium, Prozac and Xanax.  Past Medical History:  Past Medical History:  Diagnosis Date  . Anxiety   . Arthritis   . Bipolar 1 disorder ([place])   . CAD (coronary artery disease)    Non obstructive on CTA Oct 2019.   Marland Kitchen Chronic pain syndrome    back  . COPD (chronic obstructive pulmonary disease) ([place])   . Gastroparesis   . GERD (gastroesophageal reflux disease)   . Hiatal hernia   . History of bladder cancer urologist-  dr Consuella Lose   papillay TCC (Ta G1)  s/p TURBT and chemo instillation 2014  . History of encephalopathy 05/27/2015   admission w/ acute encephalopathy thought to be secondary to pain meds and COPD  . History of gastric ulcer   . History of kidney stones   . History of TIA (transient ischemic attack) 2008    no residual  . History of traumatic head injury 2010   w/ LOC  per pt needed stitches  . Hyperlipidemia   . Hypothyroidism   . Insomnia    per sleep study 04-19-2015 without sleep apnea  . Neuropathy in diabetes ([place])    LOWER EXTREMITIES  . Seizures, transient [facility]) neurologist-  dr Krista Blue--  differential dx complex partial seizure .vs.  mood disorder .vs.  pseudoseizure--  negative EEG's   confusion episodes and staring spells since 11/ 2015  . Transient confusion NEUOROLOGIST-  DR YAN   Episodes since 11/ 2015--  neurologist dx  differential complex partial seizure  .vs. mood disorder . vs. pseudoseizure  . Type 2 diabetes mellitus treated with insulin [facility])     Past Surgical History:  Procedure Laterality Date  . CARDIAC CATHETERIZATION  12-27-2001  DR Einar Gip  &  05-26-2009  DR Irish Lack   RESULTS FOR BOTH ARE NORMAL CORONARIES AND PERSERVED LVF/ EF 60%  . CARPAL TUNNEL RELEASE Right 09-16-2003  . CARPAL TUNNEL RELEASE Left 02/25/2015   Procedure: LEFT CARPAL TUNNEL RELEASE;  Surgeon: Leanora Cover, MD;  Location: [facility];  Service: Orthopedics;  Laterality: Left;  . CYSTOSCOPY N/A 10/10/2012   Procedure: CYSTOSCOPY CLOT EVACUATION FULGERATION OF BLEEDERS ;  Surgeon: Claybon Jabs, MD;  Location: [facility];  Service: Urology;  Laterality: N/A;  . CYSTOSCOPY WITH BIOPSY N/A 11/26/2015   Procedure: CYSTOSCOPY WITH BIOPSY AND FULGURATION;  Surgeon: Kathie Rhodes, MD;  Location: [facility];  Service: Urology;  Laterality: N/A;  . LAPAROSCOPIC CHOLECYSTECTOMY  11-17-2010  . NEGATIVE SLEEP STUDY  04-19-2015  in epic  . ORCHIECTOMY Right 02/21/2016   Procedure: SCROTAL ORCHIECTOMY with TESTICULAR PROSTHESIS IMPLANT;  Surgeon: Kathie Rhodes, MD;  Location: [facility];  Service: Urology;  Laterality: Right;  . ROTATOR CUFF REPAIR Right 12/2004  . TRANSURETHRAL RESECTION OF BLADDER TUMOR N/A 08/09/2012   Procedure: TRANSURETHRAL RESECTION OF BLADDER TUMOR (TURBT) WITH GYRUS WITH MITOMYCIN C;  Surgeon: Claybon Jabs, MD;  Location: [facility];  Service: Urology;  Laterality: N/A;  . TRANSURETHRAL RESECTION OF BLADDER TUMOR WITH GYRUS (TURBT-GYRUS) N/A 02/27/2014   Procedure: TRANSURETHRAL RESECTION OF BLADDER TUMOR WITH GYRUS (TURBT-GYRUS);  Surgeon: Claybon Jabs, MD;  Location: [facility];  Service: Urology;  Laterality: N/A;    Family Psychiatric History: Reviewed.  Family History:  Family History  Problem Relation Age of Onset  . Diabetes Mother   . Diabetes Father   . Hypertension Father   . Heart attack Father 60       died age 21  . Alcohol abuse Father     Social History:  Social History   Socioeconomic History  . Marital status: Married    Spouse name: Not on file  . Number of children: 3  . Years of education: GED  . Highest education level: Not on file  Occupational History  . Occupation: Engineer, technical sales    Comment: Owner of company  Social Needs  . Financial resource strain: Not on file  . Food  insecurity:    Worry: Not on file    Inability: Not on file  . Transportation needs:    Medical: Not on file    Non-medical: Not on file  Tobacco Use  . Smoking status: Current Every Day Smoker    Packs/day: 1.00    Years: 25.00    Pack years: 25.00    Types: Cigarettes  . Smokeless tobacco: Never Used  . Tobacco comment: Has cut back to 1 pack a day  Substance and Sexual Activity  . Alcohol use: No  . Drug use: No  . Sexual activity: Not Currently    Partners: Female    Birth control/protection: None  Lifestyle  . Physical activity:    Days per week: Not on file  Minutes per session: Not on file  . Stress: Not on file  Relationships  . Social connections:    Talks on phone: Not on file    Gets together: Not on file    Attends religious service: Not on file    Active member of club or organization: Not on file    Attends meetings of clubs or organizations: Not on file    Relationship status: Not on file  Other Topics Concern  . Not on file  Social History Narrative   Lives at home with his wife and children.   Left-handed.   3-4 cups caffeine per day.    Allergies:  Allergies  Allergen Reactions  . Celebrex [Celecoxib] Anaphylaxis and Rash  . Hydrocodone Rash and Other (See Comments)    "blisters developed on arms"  . Sulfa Antibiotics Rash    Metabolic Disorder Labs: Lab Results  Component Value Date   HGBA1C 9.1 (H) 12/18/2017   MPG 214 05/06/2016   MPG 166 (H) 03/06/2014   No results found for: PROLACTIN Lab Results  Component Value Date   CHOL 362 (H) 01/02/2018   TRIG 1,384 (H) 01/02/2018   HDL 29 (L) 01/02/2018   CHOLHDL NOT REPORTED DUE TO HIGH TRIGLYCERIDES 01/02/2018   VLDL UNABLE TO CALCULATE IF TRIGLYCERIDE OVER 400 mg/dL 01/02/2018   LDLCALC UNABLE TO CALCULATE IF TRIGLYCERIDE OVER 400 mg/dL 01/02/2018   [place] Comment 12/18/2017   Lab Results  Component Value Date   TSH 3.735 05/06/2016   TSH 3.225 03/05/2014    Therapeutic  Level Labs: Lab Results  Component Value Date   LITHIUM 0.35 (L) 05/28/2015   Lab Results  Component Value Date   VALPROATE 17 (L) 05/06/2016   No components found for:  CBMZ  Current Medications: Current Outpatient Medications  Medication Sig Dispense Refill  . ARIPiprazole (ABILIFY) 5 MG tablet Take 1 tablet (5 mg total) by mouth daily. 30 tablet 1  . atorvastatin (LIPITOR) 40 MG tablet Take 1 tablet (40 mg total) by mouth daily at 6 PM. 30 tablet 1  . Cholecalciferol (VITAMIN D3) 2000 units TABS TAKE 1 TABLET BY MOUTH ONCE DAILY (Patient taking differently: Take 2,000 Units by mouth daily. ) 90 tablet 0  . cyclobenzaprine (FLEXERIL) 10 MG tablet Take 1 tablet by mouth 3 times daily as needed for muscle spasm. Warning: May cause drowsiness. 21 tablet 0  . doxycycline (VIBRAMYCIN) 100 MG capsule Take 100 mg by mouth 2 (two) times daily.  0  . Dulaglutide (TRULICITY) 1.76 HY/0.7PX SOPN Inject 1 pen into the skin once a week.    Noelle Penner ASPIRIN ADULT LOW DOSE 81 MG EC tablet Take 81 mg by mouth daily.  0  . fenofibrate 160 MG tablet Take 1 tablet (160 mg total) by mouth daily. 30 tablet 0  . fentaNYL (DURAGESIC - DOSED MCG/HR) 75 MCG/HR Place 75 mcg onto the skin every 3 (three) days.     Marland Kitchen gabapentin (NEURONTIN) 600 MG tablet Take 2 tablets (1,200 mg total) by mouth 2 (two) times daily. 360 tablet 0  . hydroxychloroquine (PLAQUENIL) 200 MG tablet Take 200 mg by mouth 2 (two) times daily.     . hydrOXYzine (VISTARIL) 25 MG capsule Take 1 capsule (25 mg total) by mouth 3 (three) times daily. 90 capsule 1  . Insulin Glargine (BASAGLAR KWIKPEN) 100 UNIT/ML SOPN Inject 40 Units into the skin 2 (two) times daily.  11  . insulin lispro (HUMALOG) 100 UNIT/ML injection Inject 15 Units  into the skin 2 (two) times daily.     . isosorbide mononitrate (IMDUR) 120 MG 24 hr tablet Take 1 tablet (120 mg total) by mouth daily. 90 tablet 3  . lamoTRIgine (LAMICTAL) 150 MG tablet Take 1 tablet (150 mg total)  by mouth 2 (two) times daily. 60 tablet 1  . levothyroxine (SYNTHROID, LEVOTHROID) 25 MCG tablet Take 25 mcg by mouth daily before breakfast.     . Methotrexate, PF, (RASUVO) 25 MG/0.5ML SOAJ Inject 25 mg into the skin every 7 (seven) days.    . metoCLOPramide (REGLAN) 10 MG tablet Take 10 mg by mouth daily.  11  . metoprolol tartrate (LOPRESSOR) 25 MG tablet Take 1 tablet (25 mg total) by mouth 2 (two) times daily. 60 tablet 0  . mirtazapine ([name]) 15 MG tablet Take 1 tablet (15 mg total) by mouth at bedtime. 30 tablet 1  . Multiple Vitamins-Minerals (MULTIVITAMIN PO) Take 1 tablet by mouth daily.    . mupirocin ointment (BACTROBAN) 2 % Apply 1 application topically 2 (two) times daily. Apply to the affected area 2 times a day 22 g 3  . omeprazole (PRILOSEC) 20 MG capsule Take 20 mg by mouth daily.     . ondansetron (ZOFRAN-ODT) 4 MG disintegrating tablet Take 1 tablet (4 mg total) by mouth every 8 (eight) hours as needed for nausea or vomiting. 15 tablet 0  . pregabalin (LYRICA) 100 MG capsule Take 100 mg by mouth See admin instructions. Take 1 capsule every morning and take 2 capsules every night    . Prenatal Vit-Fe Fumarate-FA (PREPLUS) 27-1 MG TABS Take 1 tablet by mouth daily.  3  . tamsulosin (FLOMAX) 0.4 MG CAPS capsule TAKE 1 CAPSULE BY MOUTH IN THE EVENING  11  . venlafaxine XR (EFFEXOR-XR) 150 MG 24 hr capsule Take 1 capsule (150 mg total) by mouth daily with breakfast. 90 capsule 0   No current facility-administered medications for this visit.      Musculoskeletal: Strength & Muscle Tone: within normal limits Gait & Station: normal Patient leans: N/A  Psychiatric Specialty Exam: Review of Systems  Skin: Negative.   Neurological: Positive for tingling.  Psychiatric/Behavioral: Positive for depression. The patient is nervous/anxious and has insomnia.     Blood pressure (!) 156/92, pulse 98, height 6' (1.829 m), weight 221 lb (100.2 kg), SpO2 99 %.There is no height or  weight on file to calculate BMI.  General Appearance: Casual  Eye Contact:  Fair  Speech:  Slow  Volume:  Decreased  Mood:  Anxious, Depressed and Dysphoric  Affect:  Constricted  Thought Process:  Goal Directed  Orientation:  Full (Time, Place, and Person)  Thought Content: Rumination   Suicidal Thoughts:  No  Homicidal Thoughts:  No  Memory:  Immediate;   Good Recent;   Fair Remote;   Fair  Judgement:  Fair  Insight:  Fair  Psychomotor Activity:  Decreased  Concentration:  Concentration: Fair and Attention Span: Fair  Recall:  [employer] of Knowledge: Fair  Language: Good  Akathisia:  No  Handed:  Right  AIMS (if indicated): not done  Assets:  Communication Skills Desire for [place] Talents/Skills Transportation  ADL's:  Intact  Cognition: WNL  Sleep:  Fair   Screenings: AUDIT     Admission (Discharged) from 05/05/2016 in [place]  Alcohol Use Disorder Identification Test Final Score (AUDIT)  1    Mini-Mental     Office Visit from 11/04/2014  in [facility]  Total Score (max 30 points )  26    PHQ2-9     Office Visit from 03/24/2015 in [facility]  PHQ-2 Total Score  2  PHQ-9 Total Score  12       Assessment and Plan: Bipolar disorder type I.  Posttraumatic stress disorder.  Generalized anxiety disorder.  Patient struggle with depression and social isolation.  We talked about to take the medication as prescribed.  I strongly encouraged him to bring medication bottles rather than list as his list has discrepancy about medication dosage.  Since he is taking Effexor 300 mg in the morning I will not change the dose.  However I recommended to try Abilify 7.5 mg instead of 15 mg.  I am not sure where he is getting Abilify 15 mg.  I will also discontinue hydroxyzine since he does not see any improvement and we will start BuSpar 5 mg twice a day.  Recommended to take  Lamictal 300 mg in the morning as he feels taking the nighttime causing insomnia.  He has no rash or any itching.  We will also increase [name] 30 mg to help his insomnia.  Patient also taking gabapentin, Lyrica fentanyl patch by his pain medication.  He is not sure if he is taking metoprolol.  He has not seen [name] and I encouraged him to restart therapy.  Recommended to call us back if he has any question, concern or worsening of the symptoms.  Discussed safety concerns at any time having active suicidal thoughts or homicidal thought that he need to call 911 or go to local emergency room.  Follow-up in 4 to 6 weeks.  Time spent 30 minutes.  More than 50% of the time spent in psychoeducation, counseling, coronation of care, long-term prognosis and reviewing his chart.   Kathlee Nations, MD 05/13/2018, 8:47 AM

## 2018-05-20 ENCOUNTER — Ambulatory Visit (INDEPENDENT_AMBULATORY_CARE_PROVIDER_SITE_OTHER): Payer: Self-pay

## 2018-05-20 ENCOUNTER — Ambulatory Visit (INDEPENDENT_AMBULATORY_CARE_PROVIDER_SITE_OTHER): Payer: 59 | Admitting: Orthopedic Surgery

## 2018-05-20 ENCOUNTER — Encounter (INDEPENDENT_AMBULATORY_CARE_PROVIDER_SITE_OTHER): Payer: Self-pay | Admitting: Orthopedic Surgery

## 2018-05-20 VITALS — Ht 72.0 in | Wt 221.0 lb

## 2018-05-20 DIAGNOSIS — M5416 Radiculopathy, lumbar region: Secondary | ICD-10-CM

## 2018-05-20 DIAGNOSIS — M25552 Pain in left hip: Secondary | ICD-10-CM

## 2018-05-20 NOTE — Progress Notes (Signed)
Office Visit Note   Patient: Vincent [name]           Date of Birth: 1965/04/21           MRN: [id number] Visit Date: 05/20/2018              Requested by: Hayden Rasmussen, MD [place] [address] [place], [place] [address] PCP: Hayden Rasmussen, MD  Chief Complaint  Patient presents with  . Left Hip - Pain      HPI: Patient is a 53 year old gentleman who presents complaining of left-sided radicular pain from the left buttocks to the lateral aspect of the left thigh.  Patient had an MRI scan of his lumbar spine in 2017 which showed some degenerative disc disease with no herniated disc patient states that he received a epidural steroid injection at the pain clinic in [place] 2 weeks ago without relief.  Patient does have an MRI scan that is ordered but not scheduled at this time.  Patient is on a fentanyl patch as well as Neurontin he is also on Plaquenil for  arthritis.  Assessment & Plan: Visit Diagnoses:  1. Pain of left hip joint   2. Lumbar back pain with radiculopathy affecting left lower extremity     Plan: Discussed the importance of following up to obtain the MRI scan.  Discussed that we could determine whether his back could benefit from a transforaminal injection.  Follow-Up Instructions: Return if symptoms worsen or fail to improve.   Ortho Exam  Patient is alert, oriented, no adenopathy, well-dressed, normal affect, normal respiratory effort. Examination patient has an antalgic gait he has no pain with range of motion of the hip knee or ankle.  He has a negative straight leg raise he has no focal motor weakness.  Review of radiographs from 2017 showed good joint spaces in the lumbar spine with open foramen.  Imaging: Xr Hip Unilat W Or W/o Pelvis 2-3 Views Left  Result Date: 05/20/2018 2 view radiographs of the left hip shows mild joint space narrowing no avascular necrosis no bony spurs.  No images are attached to the encounter.  Labs: Lab Results    Component Value Date   HGBA1C 9.1 (H) 12/18/2017   HGBA1C 9.1 (H) 05/06/2016   HGBA1C 8.0 03/02/2015   REPTSTATUS 03/06/2014 FINAL 03/04/2014   CULT NO GROWTH Performed at [facility]  03/04/2014     Lab Results  Component Value Date   ALBUMIN 3.9 05/17/2017   ALBUMIN 3.7 05/28/2015   ALBUMIN 4.2 04/01/2015    Body mass index is 29.97 kg/m.  Orders:  Orders Placed This Encounter  Procedures  . XR HIP UNILAT W OR W/O PELVIS 2-3 VIEWS LEFT   No orders of the defined types were placed in this encounter.    Procedures: No procedures performed  Clinical Data: No additional findings.  ROS:  All other systems negative, except as noted in the HPI. Review of Systems  Objective: Vital Signs: Ht 6' (1.829 m)   Wt 221 lb (100.2 kg)   BMI 29.97 kg/m   Specialty Comments:  No specialty comments available.  PMFS History: Patient Active Problem List   Diagnosis Date Noted  . Diabetic autonomic neuropathy associated with type 2 diabetes mellitus ([place]) 02/01/2018  . DM type 2 with diabetic peripheral neuropathy ([place]) 02/01/2018  . Dyslipidemia 01/31/2018  . Coronary artery disease involving native coronary artery of native heart without angina pectoris 01/31/2018  . Chest pain  01/01/2018  . Seizure disorder ([place]) 12/19/2016  . Essential tremor 12/19/2016  . Polypharmacy 12/19/2016  . Chronic post-traumatic stress disorder (PTSD) 06/16/2016  . Tremor 06/16/2016  . Tobacco use disorder 05/05/2016  . Psychophysiological insomnia 06/02/2015  . Altered mental status 05/28/2015  . Acute bronchitis 05/28/2015  . Type 2 diabetes mellitus with hyperglycemia ([place]) 05/28/2015  . Hypothyroidism 05/28/2015  . Bipolar 2 disorder ([place]) 05/28/2015  . History of rheumatoid arthritis 05/28/2015  . Chronic pain 05/28/2015  . Depression 03/18/2015  . Diabetes ([place]) 01/23/2015  . Obesity (BMI 30-39.9) 03/06/2014  . Acute encephalopathy 03/05/2014  . HLD (hyperlipidemia)  03/05/2014  . Anemia, normocytic normochromic 03/05/2014  . Altered mental state   . Protein-calorie malnutrition, severe ([place]) 10/09/2012  . Bladder tumor 08/08/2012  . Biliary dyskinesia 11/02/2010   Past Medical History:  Diagnosis Date  . Anxiety   . Arthritis   . Bipolar 1 disorder ([place])   . CAD (coronary artery disease)    Non obstructive on CTA Oct 2019.   Marland Kitchen Chronic pain syndrome    back  . COPD (chronic obstructive pulmonary disease) ([place])   . Gastroparesis   . GERD (gastroesophageal reflux disease)   . Hiatal hernia   . History of bladder cancer urologist-  dr Consuella Lose   papillay TCC (Ta G1)  s/p TURBT and chemo instillation 2014  . History of encephalopathy 05/27/2015   admission w/ acute encephalopathy thought to be secondary to pain meds and COPD  . History of gastric ulcer   . History of kidney stones   . History of TIA (transient ischemic attack) 2008    no residual  . History of traumatic head injury 2010   w/ LOC  per pt needed stitches  . Hyperlipidemia   . Hypothyroidism   . Insomnia    per sleep study 04-19-2015 without sleep apnea  . Neuropathy in diabetes ([place])    LOWER EXTREMITIES  . Seizures, transient [facility]) neurologist-  dr Krista Blue--  differential dx complex partial seizure .vs.  mood disorder .vs.  pseudoseizure--  negative EEG's   confusion episodes and staring spells since 11/ 2015  . Transient confusion NEUOROLOGIST-  DR YAN   Episodes since 11/ 2015--  neurologist dx  differential complex partial seizure  .vs. mood disorder . vs. pseudoseizure  . Type 2 diabetes mellitus treated with insulin (HCC)     Family History  Problem Relation Age of Onset  . Diabetes Mother   . Diabetes Father   . Hypertension Father   . Heart attack Father 53       died age 21  . Alcohol abuse Father     Past Surgical History:  Procedure Laterality Date  . CARDIAC CATHETERIZATION  12-27-2001  DR Einar Gip  &  05-26-2009  DR Irish Lack   RESULTS FOR BOTH ARE NORMAL  CORONARIES AND PERSERVED LVF/ EF 60%  . CARPAL TUNNEL RELEASE Right 09-16-2003  . CARPAL TUNNEL RELEASE Left 02/25/2015   Procedure: LEFT CARPAL TUNNEL RELEASE;  Surgeon: Leanora Cover, MD;  Location: [facility];  Service: Orthopedics;  Laterality: Left;  . CYSTOSCOPY N/A 10/10/2012   Procedure: CYSTOSCOPY CLOT EVACUATION FULGERATION OF BLEEDERS ;  Surgeon: Claybon Jabs, MD;  Location: [facility];  Service: Urology;  Laterality: N/A;  . CYSTOSCOPY WITH BIOPSY N/A 11/26/2015   Procedure: CYSTOSCOPY WITH BIOPSY AND FULGURATION;  Surgeon: Kathie Rhodes, MD;  Location: [facility];  Service: Urology;  Laterality: N/A;  . LAPAROSCOPIC  CHOLECYSTECTOMY  11-17-2010  . NEGATIVE SLEEP STUDY  04-19-2015  in epic  . ORCHIECTOMY Right 02/21/2016   Procedure: SCROTAL ORCHIECTOMY with TESTICULAR PROSTHESIS IMPLANT;  Surgeon: Kathie Rhodes, MD;  Location: [facility];  Service: Urology;  Laterality: Right;  . ROTATOR CUFF REPAIR Right 12/2004  . TRANSURETHRAL RESECTION OF BLADDER TUMOR N/A 08/09/2012   Procedure: TRANSURETHRAL RESECTION OF BLADDER TUMOR (TURBT) WITH GYRUS WITH MITOMYCIN C;  Surgeon: Claybon Jabs, MD;  [facility];  Service: Urology;  Laterality: N/A;  . TRANSURETHRAL RESECTION OF BLADDER TUMOR WITH GYRUS (TURBT-GYRUS) N/A 02/27/2014   Procedure: TRANSURETHRAL RESECTION OF BLADDER TUMOR WITH GYRUS (TURBT-GYRUS);  Surgeon: Claybon Jabs, MD;  Location[facility];  Service: Urology;  Laterality: N/A;   Social History   Occupational History  . Occupation: Engineer, technical sales    Comment: Owner of company  Tobacco Use  . Smoking status: Current Every Day Smoker    Packs/day: 1.00    Years: 25.00    Pack years: 25.00    Types: Cigarettes  . Smokeless tobacco: Never Used  . Tobacco comment: Has cut back to 1 pack a day  Substance and Sexual Activity  . Alcohol use: No  . Drug use: No   . Sexual activity: Not Currently    Partners: Female    Birth control/protection: None

## 2018-05-21 DIAGNOSIS — E291 Testicular hypofunction: Secondary | ICD-10-CM | POA: Diagnosis not present

## 2018-05-21 DIAGNOSIS — N50812 Left testicular pain: Secondary | ICD-10-CM | POA: Diagnosis not present

## 2018-05-24 ENCOUNTER — Other Ambulatory Visit: Payer: Self-pay | Admitting: Urology

## 2018-05-27 ENCOUNTER — Encounter (HOSPITAL_COMMUNITY): Payer: Self-pay | Admitting: Physician Assistant

## 2018-05-27 ENCOUNTER — Encounter (HOSPITAL_BASED_OUTPATIENT_CLINIC_OR_DEPARTMENT_OTHER): Payer: Self-pay

## 2018-05-27 NOTE — Anesthesia Preprocedure Evaluation (Deleted)
Anesthesia Evaluation    Airway        Dental   Pulmonary Current Smoker,           Cardiovascular hypertension,      Neuro/Psych    GI/Hepatic   Endo/Other  diabetes  Renal/GU      Musculoskeletal   Abdominal   Peds  Hematology   Anesthesia Other Findings   Reproductive/Obstetrics                             Anesthesia Physical Anesthesia Plan  ASA:   Anesthesia Plan:    Post-op Pain Management:    Induction:   PONV Risk Score and Plan:   Airway Management Planned:   Additional Equipment:   Intra-op Plan:   Post-operative Plan:   Informed Consent:   Plan Discussed with:   Anesthesia Plan Comments: (See PAT note 05/27/18, Konrad Felix, PA-C)        Anesthesia Quick Evaluation

## 2018-05-27 NOTE — Progress Notes (Signed)
Anesthesia Chart Review   Case:  [id number] Date/Time:  05/31/18 1145   Procedure:  ORCHIECTOMY (Left )   Anesthesia type:  General   Pre-op diagnosis:  CHRONIC LEFT TESTICULAR PAIN   Location:  [facility] OR ROOM 3 / [place]   Surgeon:  Kathie Rhodes, MD      DISCUSSION:53 yo current every day smoker (25 pack years) with h/o COPD, hypothyroidism, HLD, seizures, GERD, TIA, bipolar disorder I, anxiety, PTSD, hiatal hernia, DM II, CAD (nonobstructive), mild sleep apnea, HTN, chronic pain, chronic left testicular pain scheduled for above procedure 05/31/18 with Dr. Kathie Rhodes.   Pt last seen by cardiologist, Dr. Minus Breeding, 01/31/18. Chest pain workup with nonobstructive CAD to be managed medically.  Stable at this visit with 1 year follow up recommended.   Pt can proceed with planned procedure after evaluation by anesthesia DOS (same day workup).  VS: There were no vitals taken for this visit.  PROVIDERS: Hayden Rasmussen, MD is PCP   Minus Breeding, MD is Cardiologist  LABS: Labs DOS  (all labs ordered are listed, but only abnormal results are displayed)  Labs Reviewed - No data to display   IMAGES: CT CORONARY [facility] 01/02/18 IMPRESSION: 1. Coronary calcium score of 164. This was 11 percentile for age and sex matched control.  2. Normal coronary origin with right dominance.  3. Moderate CAD in the proximal and mid RCA. Additional analysis with CT FFR will be submitted.  4. Dilated pulmonary artery measuring 34 mm suggestive of pulmonary hypertension.  US Carotid Bilateral 01/30/2014 IMPRESSION: Minor carotid atherosclerosis. No hemodynamically significant ICA stenosis by ultrasound. Degree of narrowing less than 50% bilaterally  EKG: 01/03/18 Rate 77 bpm Normal sinus rhythm  Normal ECG   CV:  Past Medical History:  Diagnosis Date  . Anxiety   . Arthritis   . Bipolar 1 disorder ([place])   . CAD (coronary artery disease)    Non obstructive on  CTA Oct 2019.   Marland Kitchen Chronic pain syndrome    back  . COPD (chronic obstructive pulmonary disease) ([place])   . DDD (degenerative disc disease), lumbar   . Gastroparesis   . GERD (gastroesophageal reflux disease)   . Hiatal hernia   . History of bladder cancer urologist-  dr Consuella Lose   papillay TCC (Ta G1)  s/p TURBT and chemo instillation 2014  . History of chest pain 12/2017  . History of encephalopathy 05/27/2015   admission w/ acute encephalopathy thought to be secondary to pain meds and COPD  . History of gastric ulcer   . History of Helicobacter pylori infection   . History of kidney stones   . History of TIA (transient ischemic attack) 2008    no residual  . History of traumatic head injury 2010   w/ LOC  per pt needed stitches  . Hyperlipidemia   . Hypertension   . Hypothyroidism   . Insomnia    per sleep study 04-19-2015 without sleep apnea  . Left carpal tunnel syndrome   . Neuropathy in diabetes ([place])    LOWER EXTREMITIES  . PTSD (post-traumatic stress disorder)   . Seizures, transient [facility]) neurologist-  dr Krista Blue--  differential dx complex partial seizure .vs.  mood disorder .vs.  pseudoseizure--  negative EEG's   confusion episodes and staring spells since 11/ 2015  . Sleep apnea    Mild  . Transient confusion NEUOROLOGIST-  DR YAN   Episodes since 11/ 2015--  neurologist dx  differential  complex partial seizure  .vs. mood disorder . vs. pseudoseizure  . Type 2 diabetes mellitus treated with insulin [facility])     Past Surgical History:  Procedure Laterality Date  . CARDIAC CATHETERIZATION  12-27-2001  DR Einar Gip  &  05-26-2009  DR Irish Lack   RESULTS FOR BOTH ARE NORMAL CORONARIES AND PERSERVED LVF/ EF 60%  . CARPAL TUNNEL RELEASE Right 09-16-2003  . CARPAL TUNNEL RELEASE Left 02/25/2015   Procedure: LEFT CARPAL TUNNEL RELEASE;  Surgeon: Leanora Cover, MD;  Location: [place];  Service: Orthopedics;  Laterality: Left;  . COLONOSCOPY  2014  . CYSTOSCOPY N/A  10/10/2012   Procedure: CYSTOSCOPY CLOT EVACUATION FULGERATION OF BLEEDERS ;  Surgeon: Claybon Jabs, MD;  Location: [facility];  Service: Urology;  Laterality: N/A;  . CYSTOSCOPY WITH BIOPSY N/A 11/26/2015   Procedure: CYSTOSCOPY WITH BIOPSY AND FULGURATION;  Surgeon: Kathie Rhodes, MD;  Location: [place];  Service: Urology;  Laterality: N/A;  . ESOPHAGOGASTRODUODENOSCOPY  2014  . LAPAROSCOPIC CHOLECYSTECTOMY  11-17-2010  . NEGATIVE SLEEP STUDY  04-19-2015  in epic  . ORCHIECTOMY Right 02/21/2016   Procedure: SCROTAL ORCHIECTOMY with TESTICULAR PROSTHESIS IMPLANT;  Surgeon: Kathie Rhodes, MD;  Location: [facility];  Service: Urology;  Laterality: Right;  . ROTATOR CUFF REPAIR Right 12/2004  . TRANSURETHRAL RESECTION OF BLADDER TUMOR N/A 08/09/2012   Procedure: TRANSURETHRAL RESECTION OF BLADDER TUMOR (TURBT) WITH GYRUS WITH MITOMYCIN C;  Surgeon: Claybon Jabs, MD;  Location: [facility];  Service: Urology;  Laterality: N/A;  . TRANSURETHRAL RESECTION OF BLADDER TUMOR WITH GYRUS (TURBT-GYRUS) N/A 02/27/2014   Procedure: TRANSURETHRAL RESECTION OF BLADDER TUMOR WITH GYRUS (TURBT-GYRUS);  Surgeon: Claybon Jabs, MD;  Location: [facility];  Service: Urology;  Laterality: N/A;    MEDICATIONS: No current facility-administered medications for this encounter.    . ARIPiprazole (ABILIFY) 15 MG tablet  . atorvastatin (LIPITOR) 40 MG tablet  . busPIRone (BUSPAR) 5 MG tablet  . Cholecalciferol (VITAMIN D3) 2000 units TABS  . cyclobenzaprine (FLEXERIL) 10 MG tablet  . Dulaglutide (TRULICITY) 1.69 IH/0.3UU SOPN  . EQ ASPIRIN ADULT LOW DOSE 81 MG EC tablet  . fenofibrate 160 MG tablet  . fentaNYL (DURAGESIC - DOSED MCG/HR) 75 MCG/HR  . gabapentin (NEURONTIN) 600 MG tablet  . hydroxychloroquine (PLAQUENIL) 200 MG tablet  . Insulin Glargine (BASAGLAR KWIKPEN) 100 UNIT/ML SOPN  . insulin lispro (HUMALOG) 100 UNIT/ML  injection  . isosorbide mononitrate (IMDUR) 120 MG 24 hr tablet  . lamoTRIgine (LAMICTAL) 150 MG tablet  . levothyroxine (SYNTHROID, LEVOTHROID) 25 MCG tablet  . Methotrexate, PF, (RASUVO) 25 MG/0.5ML SOAJ  . metoCLOPramide (REGLAN) 10 MG tablet  . metoprolol tartrate (LOPRESSOR) 25 MG tablet  . mirtazapine ([name]) 30 MG tablet  . Multiple Vitamins-Minerals (MULTIVITAMIN PO)  . mupirocin ointment (BACTROBAN) 2 %  . omeprazole (PRILOSEC) 20 MG capsule  . ondansetron (ZOFRAN-ODT) 4 MG disintegrating tablet  . pregabalin (LYRICA) 100 MG capsule  . Prenatal Vit-Fe Fumarate-FA (PREPLUS) 27-1 MG TABS  . tamsulosin (FLOMAX) 0.4 MG CAPS capsule  . venlafaxine XR (EFFEXOR-XR) 150 MG 24 hr capsule    Maia Plan [facility] Pre-Surgical Testing [phone] 05/27/18 2:36 PM

## 2018-05-28 NOTE — Progress Notes (Signed)
Left message on voicemail to call surgeon if experiencing any resp symptoms or if he's been anyone anyone who is.  Also, I asked on voicemail if he would call if he's done any travel outside of area. He is to being only one person with him to the surgery center on Friday.

## 2018-05-29 ENCOUNTER — Encounter (HOSPITAL_COMMUNITY): Payer: Self-pay | Admitting: Psychology

## 2018-05-29 ENCOUNTER — Other Ambulatory Visit: Payer: Self-pay

## 2018-05-29 ENCOUNTER — Encounter (HOSPITAL_BASED_OUTPATIENT_CLINIC_OR_DEPARTMENT_OTHER): Payer: Self-pay

## 2018-05-29 ENCOUNTER — Ambulatory Visit (HOSPITAL_COMMUNITY): Payer: Medicare Other | Admitting: Psychology

## 2018-05-29 NOTE — Progress Notes (Signed)
Vincent [name] is a 53 y.o. male patient who didn't show for appointment.  Letter sent.        [name], LPC

## 2018-05-29 NOTE — Progress Notes (Signed)
Spoke with:  [name] (wife) NPO:  After Midnight, no gum, candy, or mints   Arrival time: 0945AM Labs: BMP (EKG in epic/chart) AM medications: Buspirone, Fenofibrate, Gabapentin, Isosorbide, Lamotrigine, Levothyroxine, Metoclopramide, Metoprolol, Omeprazole, Pregabalin, Venlafaxine  Pre op orders: Yes Ride home:  Informed [name] if we proceed with [place] proceed she will need to find him a ride because due to her symptoms of cough, congestion, runny nose, headache and recent testing for Flu she would not be able to come with him that day.  She is working on arranging transportation.

## 2018-05-29 NOTE — Progress Notes (Addendum)
Mr. Townley wife currently has cough, congestion, runny nose, headache, denies fever.  She was seen by her primary doctor 05/28/2018 and test for Flu and is awaiting her results.  Coni Mabe at Dr. Simone Curia office made aware.

## 2018-05-31 ENCOUNTER — Ambulatory Visit (HOSPITAL_BASED_OUTPATIENT_CLINIC_OR_DEPARTMENT_OTHER)
Admission: RE | Admit: 2018-05-31 | Payer: No Typology Code available for payment source | Source: Home / Self Care | Admitting: Urology

## 2018-05-31 HISTORY — DX: Carpal tunnel syndrome, bilateral upper limbs: G56.03

## 2018-05-31 HISTORY — DX: Other general symptoms and signs: R68.89

## 2018-05-31 HISTORY — DX: Sleep apnea, unspecified: G47.30

## 2018-05-31 HISTORY — DX: Other intervertebral disc degeneration, lumbar region: M51.36

## 2018-05-31 HISTORY — DX: Type 2 diabetes mellitus with diabetic polyneuropathy: E11.42

## 2018-05-31 HISTORY — DX: Essential (primary) hypertension: I10

## 2018-05-31 HISTORY — DX: Personal history of other infectious and parasitic diseases: Z86.19

## 2018-05-31 HISTORY — DX: Post-traumatic stress disorder, unspecified: F43.10

## 2018-05-31 HISTORY — DX: Other intervertebral disc degeneration, lumbar region without mention of lumbar back pain or lower extremity pain: M51.369

## 2018-05-31 HISTORY — DX: Unspecified disturbances of skin sensation: R20.9

## 2018-05-31 HISTORY — DX: Personal history of other specified conditions: Z87.898

## 2018-05-31 SURGERY — ORCHIECTOMY
Anesthesia: General | Laterality: Left

## 2018-06-13 ENCOUNTER — Other Ambulatory Visit: Payer: Self-pay

## 2018-06-13 ENCOUNTER — Ambulatory Visit (INDEPENDENT_AMBULATORY_CARE_PROVIDER_SITE_OTHER): Payer: No Typology Code available for payment source | Admitting: Psychiatry

## 2018-06-13 DIAGNOSIS — F319 Bipolar disorder, unspecified: Secondary | ICD-10-CM

## 2018-06-13 DIAGNOSIS — F4312 Post-traumatic stress disorder, chronic: Secondary | ICD-10-CM

## 2018-06-13 DIAGNOSIS — F411 Generalized anxiety disorder: Secondary | ICD-10-CM

## 2018-06-13 DIAGNOSIS — F3181 Bipolar II disorder: Secondary | ICD-10-CM

## 2018-06-13 MED ORDER — LAMOTRIGINE 150 MG PO TABS: 300.0000 mg | ORAL_TABLET | ORAL | 2 refills | Status: DC

## 2018-06-13 MED ORDER — BUSPIRONE HCL 5 MG PO TABS: 5.0000 mg | ORAL_TABLET | Freq: Two times a day (BID) | ORAL | 2 refills | Status: DC

## 2018-06-13 MED ORDER — VENLAFAXINE HCL ER 150 MG PO CP24: 300.0000 mg | ORAL_CAPSULE | Freq: Every day | ORAL | 2 refills | Status: DC

## 2018-06-13 MED ORDER — MIRTAZAPINE 30 MG PO TABS: 30.0000 mg | ORAL_TABLET | Freq: Every day | ORAL | 2 refills | Status: DC

## 2018-06-13 MED ORDER — ARIPIPRAZOLE 5 MG PO TABS: 5.0000 mg | ORAL_TABLET | Freq: Every day | ORAL | 2 refills | Status: DC

## 2018-06-13 NOTE — Progress Notes (Signed)
Virtual Visit via Telephone Note  I connected with Vincent [name] on 06/13/18 at  9:20 AM EDT by telephone and verified that I am speaking with the correct person using two identifiers.   I discussed the limitations, risks, security and privacy concerns of performing an evaluation and management service by telephone and the availability of in person appointments. I also discussed with the patient that there may be a patient responsible charge related to this service. The patient expressed understanding and agreed to proceed.   History of Present Illness: Patient was evaluated through phone session.  He is doing much better since taking the medication as prescribed.  I reconciled medication list with him.  He is taking Effexor, BuSpar, [name] and Abilify.  He is sleeping better.  His nightmares and flashbacks are less intense and less frequent.  He is sad because he lost his job due to pandemic coronavirus but he is relieved because sleeping is much better.  Sometimes he gets anxious because he is confined to the house and staying most of the time at home keeping social distancing.  Overall he reported his depression is better.  He denies any irritability, ups and downs and highs and lows.  He reported his mood is much better since medicines were adjusted.  He still have some difficulty remembering things and he needed his help from his wife about medication dosages.  He is taking Abilify 5 mg instead of 7.5.  He has chronic pain and diabetes but feel they are under control.  He denies any paranoia or any hallucination.  He like to keep the same dosage of the medication.  He denies any tremors, shakes or any EPS.  Denies drinking or using any illegal substances.  He is getting pain medication from his pain specialist.   Past Psychiatric History: Reviewed. H/O one brief hospitalization at [facility] due to suicidal thoughts. No h/o suicidal attempt. H/O anger, anxiety, PTSD and heavy substance  use. 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 Tried Depakote, Zoloft, lithium, Prozac and Xanax.  Observations/Objective: Limited mental stat examination done on the phone.  Patient describes his mood euthymic.  He gets distracted on the phone and has difficulty remembering things but his speech is clear and relevant.  He denies any auditory or visual hallucination.  He denies any active or passive suicidal thoughts or homicidal thought.  Denies any highs and lows or mania.  There were no grandiosity or delusions.  He is alert and oriented x3.  His fund of knowledge is below average.  He reported no tremors shakes or any EPS.  His cognition is grossly intact.  His insight judgment is fair.  Assessment and Plan: Bipolar disorder type I.  Posttraumatic stress disorder.  Generalized anxiety disorder.  Patient is doing better since the medicine doses adjusted.  He is taking Abilify 5 mg rather than 7.5 mg but doing better and reported no side effects.  I will not change his medication since he is doing better.  He is seeing [name] for therapy.  Continue [name] 30 mg at bedtime, BuSpar 5 mg twice a day, Abilify 5 mg daily and Lamictal 300 mg daily.  He do not recall any rash or itching.  Discussed polypharmacy.  Will consider decreasing the dose of the medication in the future.  Encouraged to continue therapy with [name].  Recommended to call us back if is any question or any concern.  I will see him in 3 months.  Follow Up Instructions:    I discussed the assessment and treatment plan with the patient. The patient was provided an opportunity to ask questions and all were answered. The patient agreed with the plan and demonstrated an understanding of the instructions.   The patient was advised to call back or seek an in-person evaluation if the symptoms worsen or if the condition fails to improve as anticipated.  I provided 15-20 minutes of non-face-to-face time during this  encounter.   Kathlee Nations, MD

## 2018-07-01 ENCOUNTER — Telehealth: Payer: Self-pay | Admitting: Cardiology

## 2018-07-01 NOTE — Telephone Encounter (Signed)
lmtcb - 6 month recall/ virtual now or OV in august.

## 2018-07-05 ENCOUNTER — Telehealth: Payer: Self-pay | Admitting: Cardiology

## 2018-07-05 NOTE — Telephone Encounter (Signed)
Smartphone/ my chart via text/ consent/ pre reg consent

## 2018-07-06 NOTE — Progress Notes (Signed)
Error

## 2018-07-08 ENCOUNTER — Telehealth: Payer: Medicare Other | Admitting: Cardiology

## 2018-07-18 ENCOUNTER — Telehealth (HOSPITAL_COMMUNITY): Payer: Self-pay | Admitting: Psychology

## 2018-07-18 NOTE — Telephone Encounter (Signed)
Called to inform pt that counselor is available for virtual visits at this time and to call to office [phone] to set up an appointment.

## 2018-07-26 DIAGNOSIS — M5136 Other intervertebral disc degeneration, lumbar region: Secondary | ICD-10-CM | POA: Diagnosis not present

## 2018-07-26 DIAGNOSIS — G894 Chronic pain syndrome: Secondary | ICD-10-CM | POA: Diagnosis not present

## 2018-07-26 DIAGNOSIS — M47816 Spondylosis without myelopathy or radiculopathy, lumbar region: Secondary | ICD-10-CM | POA: Diagnosis not present

## 2018-07-26 DIAGNOSIS — F4310 Post-traumatic stress disorder, unspecified: Secondary | ICD-10-CM | POA: Diagnosis not present

## 2018-07-30 ENCOUNTER — Other Ambulatory Visit: Payer: Self-pay | Admitting: Urology

## 2018-08-28 NOTE — Progress Notes (Signed)
Appointment scheduled 6/18 for Covid 19 screen prior to procedure on 6/22.

## 2018-08-29 ENCOUNTER — Other Ambulatory Visit (HOSPITAL_COMMUNITY)
Admission: RE | Admit: 2018-08-29 | Discharge: 2018-08-29 | Disposition: A | Discharge status: Home / Self Care | Payer: No Typology Code available for payment source | Source: Ambulatory Visit | Attending: Urology | Admitting: Urology

## 2018-08-29 DIAGNOSIS — Z1159 Encounter for screening for other viral diseases: Secondary | ICD-10-CM | POA: Diagnosis not present

## 2018-08-29 LAB — SARS CORONAVIRUS 2 (TAT 6-24 HRS): SARS Coronavirus 2: NEGATIVE

## 2018-08-29 NOTE — H&P (Signed)
HPI: Vincent [name] is a 53 year-old male with chronic left testicularl pain.  The problem is on the left side. He first noticed the pain approximately 02/10/2018. He does not have a history of scrotal trauma.   The pain is constant.   04/24/18: This patient c/o left testicular pain x 2 months. He is straining to void. He also has constipation. He is also using a fentanyl patch for t/o low back pain, which he states has not been helpful for relief of his testicular pain. He is requesting medication for pain relief.   05/08/18: He has returned today for cord block due to persistent left testicular pain. He reports no new complaints today.   05/21/18: He underwent a cord block on 05/08/18 with initial good results. He said he achieved about 40% relief of his pain but it only lasted for about 24 hours and then his pain recurred. It now persists. He wanted to discuss surgical management.     ALLERGIES: CeleBREX CAPS - Swelling Hydrocodone-Acetaminophen CAPS - Itching, Skin Rash Sulfa Drugs - Itching    MEDICATIONS: Tamsulosin Hcl 0.4 mg capsule 1 capsule PO Q PM  Abilify  Atorvastatin Calcium 10 mg tablet Oral  Basaglar Kwikpen U-100  Duragesic 100 mcg/hour patch, transdermal 72 hours Transdermal  Flomax 0.4 mg capsule 1 capsule PO Daily  Folic Acid 0.8 mg tablet Oral  Gabapentin 600 mg tablet Oral  Humalog Mix 75-25 Kwikpen 100 unit/ml (75-25) insulin pen Subcutaneous  Levothyroxine Sodium 25 MCG Oral Tablet Oral  Lyrica 100 mg capsule Oral  Metoclopramide HCl - 5 MG Oral Tablet Oral  Prenatal Vitamin For Men  Tamsulosin HCl - 0.4 MG Oral Capsule 0 Oral  Trazodone Hcl     GU PSH: Bladder Instill AntiCA Agent - 2014 Cysto Bladder Ureth Biopsy - 11/26/2015 Cysto Fulgurate < 0.5 cm - 11/26/2015 Cystoscopy - 05/01/2018, 10/23/2017, 07/19/2017, 03/20/2017, 11/30/2016, 06/06/2016, 02/29/2016, 2017 Cystoscopy TURBT <2 cm - 2015 Cystoscopy TURBT 2-5 cm - 2014 N Block Inj Ilio-ing/hypogi - 05/08/2018,  02/08/2016 Penile Injection - 06/19/2016 Simple orchiectomy - 02/21/2016      PSH Notes: Cystoscopy With Fulguration Small Lesion (5-84mm), Bladder Injection Of Cancer Treatment, Cystoscopy With Fulguration Medium Lesion (2-5cm), Cholecystectomy, Wrist Arthroscopy With Release Of Transverse Carpal Ligament, Rotator Cuff Repair, Elective Circumcision   NON-GU PSH: Carpal tunnel surgery - 2014 Cholecystectomy (open) - 2014    GU PMH: Left testicular pain (Stable, Chronic), At this point what I have recommended is that he contact me because I am hopeful that he will achieve relief and then this will break the pain cycle and he will not need any further therapy but I told him to let me know if he gets no relief at all or if he only gets a short duration of relief with recurrence of the pain. - 05/08/2018, Left, He has developed significant left testicular pain to the point where he told me he would like to have his left testicle removed. I told him before we did anything like that my recommendation is to 1st undergo a cord block and we discussed the mechanism by which this worked and the fact that it often times can break the cycle of pain. If it is determined that the pain is coming from his testicle and he does not experience lasting relief then I briefly discussed skeletonization of the spermatic cord with removal of nerves., - 05/01/2018 History of bladder cancer (Stable), He had no evidence of recurrent transitional cell carcinoma the bladder  noted cystoscopically today. He will continue surveillance cystoscopy with a repeat in 6 months. - 05/01/2018, - 04/24/2018 (Stable), He had no evidence of recurrent transitional cell carcinoma on cystoscopy today. It is now been 2 years and therefore he will return in 6 months for his next surveillance cystoscopy, - 10/23/2017 (Stable), He had no evidence of recurrent transitional cell carcinoma of the bladder today. He did have bacteriuria and is experiencing dysuria. I  am going to continue monitoring him with surveillance cystoscopy every 3 months., - 07/19/2017 (Stable), He had no evidence of recurrent transitional cell carcinoma of the bladder but even know he has had low-grade, superficial lesions he has had a recurrent so I am going to continue to monitor him every 3 months with surveillance cystoscopy., - 03/20/2017 (Stable), He had no evidence of recurrent transitional cell carcinoma bladder noted cystoscopically today. I will have him return again in 3 months for repeat surveillance cystoscopy., - 11/30/2016 (Stable), He had no evidence of recurrent transitional cell carcinoma of the bladder noted cystoscopically today. I will continue monitoring him with surveillance cystoscopy every 3 months for now., - 06/06/2016 (Stable), He had no evidence of transitional cell carcinoma of the bladder today. His urine will be sent for cytology and I'll plan to see him back in 3 months for surveillance cystoscopy., - 02/29/2016 (Stable), He has a history of bladder cancer although he is not having any urinary symptoms., - 01/18/2016 (Stable), I have given him samples of Uribel to see if this seems to help any better and he is going to contact me in a couple of weeks if he is not noting improvement., - 12/17/2015 (Worsening), Left, There is an area just posterior to his left ureteral orifice that I had noted previously. It doesn't really appear that it has progressed but I think it may be just a little bit too large to try and fulguration in the office without causing the patient discomfort and therefore I recommended doing this as an outpatient under anesthesia. He is in agreement with that., - 2017, History of carcinoma of bladder, - 2017 Disorder of male genital organs, unspecified - 04/24/2018 Urinary Hesitancy, . He has developed some mild hesitancy but it is not significant enough that he would want to consider medication at this point. - 10/23/2017, He has reported having developed  significant hesitancy. He does have some BPH noted cystoscopically. I'm going to try a course of tamsulosin., - 11/30/2016 BPH w/o LUTS (Stable), He does have some BPH noted cystoscopically but is currently not experiencing significant obstructive voiding symptoms on tamsulosin now. - 03/20/2017 ED due to arterial insufficiency (Stable), He continues to have difficulty with erectile dysfunction and this has not responded to phosphodiesterase inhibitor therapy or intracavernosal injection therapy. We briefly discussed penile prosthesis implantation today and I have given him information from [place] to review. - 03/20/2017, (Stable), He had a moderate erection with 0.5 cc so I recommended increasing the dosage to 0.7 with a maximum of 1.0 cc but started off at the lower dose with sexual stimulation. I went over the need to vary the location and the potential risks and complications., - 06/19/2016 (Worsening), He has not responded to Viagra and inquired as to whether other options were available. We discussed Cialis and he would like to proceed with that. He, - 02/08/2016 Epididymitis (Stable), We did discuss surgical therapy if the cord block was ineffective. - 02/08/2016, (Worsening), Right, He continues to have fairly significant pain. There was no evidence of  abscess or neoplasm in his epididymis. This is the area that is tender. I don't feel that this is due to any nerve impingement in his back since he is quite tender to palpation. Ice has made it worse so I recommended the use of a heating pad and I am going to put him on nonsteroidal anti-inflammatory medication. I do not feel further antibiotics are necessary. If he has not seen any improvement either beginning of next week I am going to have him return and perform a cord block. We did discuss today the last resort which would be surgical removal of his epididymis., - 01/25/2016 (Worsening, Acute), Right, He clearly has tenderness in the epididymis and we  discussed the possible etiologies. I think infectious etiology is probably the most likely. I told him that his recent surgery would not have caused this and he did receive perioperative antibiotics for the procedure. I am going to place him on a different antibiotic with a different spectrum and also give him further pain medication. It is possible this may be a spermatocele which would be self-limited but are now I'm going to continue antibiotic therapy and pain medication with a scrotal ultrasound if she does not note improvement., - 01/18/2016, Right, - 01/07/2016 Right testicular pain (Stable), His right testicular pain has persisted and therefore he underwent a right cord block today. - 02/08/2016 Dysuria, We discussed cystoscopy as a way to evaluate this as he does not appear to have any infection but he wanted to hold off on that. I think that's reasonable as I suspect this will resolve over time. - 12/17/2015 BPH w/LUTS, Benign prostatic hyperplasia with hesitancy - 2017 Primary hypogonadism, Hypogonadism, testicular - 2017 Other microscopic hematuria, Microscopic hematuria - 2016 Urinary Urgency, Urinary urgency - 2016 Encounter for Prostate Cancer screening, Prostate cancer screening - 2015 Other Disorder Prostate, Acquired asymmetry of prostate - 14 Male ED, unspecified, Erectile dysfunction - 2014      PMH Notes: History of phimosis: He developed phimosis secondary to diabetes in 10/03 and underwent a circumcision in 2/05.   Transitional cell carcinoma of the bladder: He was found to have 7-10 rbc's/hpf. He has a 60-pack-year smoking history. He was found to have a papillary tumor on the left posterior wall of his bladder and underwent TURBT with postoperative mitomycin-C on 08/09/12.  Pathology: Papillary transitional cell carcinoma (Ta,G1)  In 12/15 lesion was noted in the prostatic urethra at the bladder neck and was resected.  Pathology: Cystitis cystica and cystitis glandularis only  with no evidence of cancer.  Recurrence 11/26/15: Bladder biopsy positive for low-grade, superficial papillary TCCa from the left floor and posterior wall. (Ta,G1)    Prostatic asymmetry: He was found on DRE to have some right lobe enlargement relative to the left however there were no worrisome findings and his PSA in 4/14 was 0.47.   LUTS: He noted the acute onset of hesitancy and intermittency with some associated suprapubic tenderness and intermittent bilateral testicular pain without associated dysuria.   Primary hypogonadism: He was experiencing a decreased libido and diminished erectile quality. He was found to have a serum of 167. Further evaluation revealed a normal FSH, LH and prolactin indicating primary hypogonadism.  Previously tried: AndroGel 2 pumps & Testim  Current therapy: Testosterone injections 100 mg q.3 weeks   Right testicular pain: He developed unrelenting right testicular pain and eventually, after multiple forms of therapy were tried and were unsuccessful, underwent a right scrotal orchiectomy on 02/21/16 with placement of  a testicular prosthesis.  Pathology: No abnormality noted of the testicle.   Erectile dysfunction: He has tried Cialis, sildenafil and even push the dosage up to the maximum without significant effect on his erectile function.  Current treatment: Intracavernosal injections (40/30/1).     NON-GU PMH: Infection following a procedure, initial encounter (Acute), Wound culture sent. - 03/03/2016 Other specified postprocedural states, S/P orchiectomy. Wound culture sent. Will have pt begin Doxycycline 100 mg 1 po BID X 10 days. May use heat TID/QID. Use good scrotal support. Contact oncall over holiday weekend if temp >100.5, worsening pain/swelling. - 03/03/2016 Anxiety, Anxiety (Symptom) - 2014 Gastric ulcer, unspecified as acute or chronic, without hemorrhage or perforation, Gastric Ulcer - 2014 Personal history of other diseases of the circulatory  system, History of hypertension - 2014 Personal history of other diseases of the digestive system, History of esophageal reflux - 2014 Personal history of other endocrine, nutritional and metabolic disease, History of diabetes mellitus - 2014, History of hypercholesterolemia, - 2014, History of hypothyroidism, - 2014 Personal history of other mental and behavioral disorders, History of depression - 2014    FAMILY HISTORY: Acute Myocardial Infarction - Father Death In The Family Father - Runs In Family Diabetes - Runs In [place] _4__ Living Daughter - Runs In Family Family Health Status Of Mother - Alive 73 - Runs In Family nephrolithiasis - Father renal failure - Father   SOCIAL HISTORY: Marital Status: Married Preferred Language: English; Race: White Current Smoking Status: Patient smokes. Smokes 2 packs per day.  Does not drink anymore.  Does not use drugs. Drinks 4+ caffeinated drinks per day.     Notes: Current every day smoker, Tobacco use, Alcohol Use, Caffeine Use, Marital History - Currently Married   REVIEW OF SYSTEMS:    GU Review Male:   Patient denies frequent urination, hard to postpone urination, burning/ pain with urination, get up at night to urinate, leakage of urine, stream starts and stops, trouble starting your stream, have to strain to urinate , erection problems, and penile pain.  Gastrointestinal (Upper):   Patient denies nausea, vomiting, and indigestion/ heartburn.  Gastrointestinal (Lower):   Patient denies diarrhea and constipation.  Constitutional:   Patient denies fever, night sweats, weight loss, and fatigue.  Skin:   Patient denies skin rash/ lesion and itching.  Eyes:   Patient denies blurred vision and double vision.  Ears/ Nose/ Throat:   Patient denies sore throat and sinus problems.  Hematologic/Lymphatic:   Patient denies easy bruising and swollen glands.  Cardiovascular:   Patient denies leg swelling and chest pains.   Respiratory:   Patient denies cough and shortness of breath.  Endocrine:   Patient denies excessive thirst.  Musculoskeletal:   Patient denies back pain and joint pain.  Neurological:   Patient denies headaches and dizziness.  Psychologic:   Patient denies depression and anxiety.   VITAL SIGNS:    Weight 224 lb / 101.6 kg  Height 72 in / 182.88 cm  BP 163/89 mmHg  Pulse 85 /min  BMI 30.4 kg/m   Physical Exam  Constitutional: Well nourished and well developed . No acute distress.   ENT:. The ears and nose are normal in appearance.   Neck: The appearance of the neck is normal and no neck mass is present.   Pulmonary: No respiratory distress and normal respiratory rhythm and effort.   Cardiovascular: Heart rate and rhythm are normal . No peripheral edema.   Abdomen: The abdomen  is soft and nontender. No masses are palpated. No CVA tenderness. No hernias are palpable. No hepatosplenomegaly noted.   Rectal: Rectal exam demonstrates normal sphincter tone, no tenderness and no masses. Estimated prostate size is 1+. His prostate is asymmetric with the right lobe larger than the left but smooth and nonindurated. The prostate has no nodularity and is not tender. The left seminal vesicle is nonpalpable. The right seminal vesicle is nonpalpable. The perineum is normal on inspection.   Genitourinary: Examination of the penis demonstrates no discharge, no masses, no lesions and a normal meatus.  Testes: Tender left testis. Absent right testis. No swelling, no enlargement left testis. Normal location left testis. No mass, no cyst, no varicocele, no hydrocele left testis.     Lymphatics: The femoral and inguinal nodes are not enlarged or tender.   Skin: Normal skin turgor, no visible rash and no visible skin lesions.   Neuro/Psych:. Mood and affect are appropriate.    PAST DATA REVIEWED:  Source Of History:  Patient  Records Review:   Previous Patient Records, POC Tool   09/03/17  07/19/17 12/05/13 08/02/13 07/09/12  PSA  Total PSA 1.36 ng/mL 4.95 ng/mL 0.83  0.53  0.47   Free PSA  0.66 ng/mL     % Free PSA  13 % PSA       12/05/13 08/02/13 02/04/13 01/17/13  Hormones  Testosterone, Total 219  212  232  167     PROCEDURES: None   ASSESSMENT/PLAN:      ICD-10 Details  1 GU:   Left testicular pain - N50.812 Stable - He is going to be scheduled for a left scrotal orchiectomy as an outpatient.  2   Primary hypogonadism - E29.1 Stable - I am going to check his serum testosterone in the morning this week and then obtain a 2nd a.m. testosterone level on the morning of his surgery.              Notes:   I had a long discussion with he and his wife about the fact that he did achieve some relief with his cord block which would tend indicate that the pain is from the testicle and not from a location proximal to the cord block location. Because of that we discussed the options for management and he has had his previous right testicle removed for chronic pain with good result but I told him that operating for pain is somewhat of a risk as there is a chance that the pain persists despite the surgery.  We then discussed the options and I told him that performing an inguinal incision and skeletonizing his spermatic cord with maintenance of the blood supply as well as the lymphatics would likely result in resolution of the chronic pain but allow him to maintain his testicle. I told him that he would then require testosterone replacement and he has been diagnosed with hypogonadism in the past but was not on testosterone replacement because of cost. The other option would be simple orchiectomy which he indicated to me that he would prefer. He said he has undergone this procedure before and if he is going to be on testosterone replacement he wanted to undergo the surgery with the greatest probability of eliminating the pain as well as allowing him to get back to work sooner and indicated that  he would prefer an orchidectomy.

## 2018-08-30 ENCOUNTER — Encounter (HOSPITAL_BASED_OUTPATIENT_CLINIC_OR_DEPARTMENT_OTHER): Payer: Self-pay

## 2018-08-30 ENCOUNTER — Other Ambulatory Visit: Payer: Self-pay

## 2018-08-30 NOTE — Progress Notes (Signed)
SPOKE W/  KIM     SCREENING SYMPTOMS OF COVID 19:   COUGH--NO  RUNNY NOSE--- NO  SORE THROAT---NO  NASAL CONGESTION----NO  SNEEZING----NO  SHORTNESS OF BREATH---NO  DIFFICULTY BREATHING---NO  TEMP >100.0 -----NO  UNEXPLAINED BODY ACHES------NO  CHILLS -------- NO  HEADACHES ---------NO  LOSS OF SMELL/ TASTE --------NO    HAVE YOU OR ANY FAMILY MEMBER TRAVELLED PAST 14 DAYS OUT OF THE   COUNTY---NO STATE----NO COUNTRY----NO  HAVE YOU OR ANY FAMILY MEMBER BEEN EXPOSED TO ANYONE WITH COVID 19? NO

## 2018-08-30 NOTE — Progress Notes (Signed)
Spoke with: [name] (wife) NPO:  After Midnight, no gum, candy, or mints  No smoking after midnight Arrival time: 0830AM Labs: Istat 4 (EKG chart/epic) AM medications: Isosorbide, Buspirone, Fenofibrate, Lamotrigine, Levothyroxine, Metoptolol, Omeprazole, Pregabalin, Venlafaxaine, Take 1/2 Basaglar Pre op orders: Yes Ride home:  [name] (wife) [phone]

## 2018-09-02 ENCOUNTER — Ambulatory Visit (HOSPITAL_BASED_OUTPATIENT_CLINIC_OR_DEPARTMENT_OTHER)
Admission: RE | Admit: 2018-09-02 | Discharge: 2018-09-02 | Disposition: A | Discharge status: Home / Self Care | Payer: No Typology Code available for payment source | Attending: Urology | Admitting: Urology

## 2018-09-02 ENCOUNTER — Ambulatory Visit (HOSPITAL_BASED_OUTPATIENT_CLINIC_OR_DEPARTMENT_OTHER): Payer: No Typology Code available for payment source | Admitting: Certified Registered Nurse Anesthetist

## 2018-09-02 ENCOUNTER — Encounter (HOSPITAL_BASED_OUTPATIENT_CLINIC_OR_DEPARTMENT_OTHER): Payer: Self-pay

## 2018-09-02 ENCOUNTER — Other Ambulatory Visit: Payer: Self-pay

## 2018-09-02 ENCOUNTER — Encounter (HOSPITAL_BASED_OUTPATIENT_CLINIC_OR_DEPARTMENT_OTHER)
Admission: RE | Disposition: A | Discharge status: Home / Self Care | Payer: Self-pay | Source: Home / Self Care | Attending: Urology

## 2018-09-02 DIAGNOSIS — Z841 Family history of disorders of kidney and ureter: Secondary | ICD-10-CM | POA: Insufficient documentation

## 2018-09-02 DIAGNOSIS — Z8249 Family history of ischemic heart disease and other diseases of the circulatory system: Secondary | ICD-10-CM | POA: Insufficient documentation

## 2018-09-02 DIAGNOSIS — E039 Hypothyroidism, unspecified: Secondary | ICD-10-CM | POA: Diagnosis not present

## 2018-09-02 DIAGNOSIS — Z833 Family history of diabetes mellitus: Secondary | ICD-10-CM | POA: Insufficient documentation

## 2018-09-02 DIAGNOSIS — Z79899 Other long term (current) drug therapy: Secondary | ICD-10-CM | POA: Diagnosis not present

## 2018-09-02 DIAGNOSIS — Z8551 Personal history of malignant neoplasm of bladder: Secondary | ICD-10-CM | POA: Insufficient documentation

## 2018-09-02 DIAGNOSIS — Z794 Long term (current) use of insulin: Secondary | ICD-10-CM | POA: Insufficient documentation

## 2018-09-02 DIAGNOSIS — G8929 Other chronic pain: Secondary | ICD-10-CM | POA: Diagnosis not present

## 2018-09-02 DIAGNOSIS — M545 Low back pain: Secondary | ICD-10-CM | POA: Diagnosis not present

## 2018-09-02 DIAGNOSIS — N442 Benign cyst of testis: Secondary | ICD-10-CM | POA: Insufficient documentation

## 2018-09-02 DIAGNOSIS — Z886 Allergy status to analgesic agent status: Secondary | ICD-10-CM | POA: Insufficient documentation

## 2018-09-02 DIAGNOSIS — K5900 Constipation, unspecified: Secondary | ICD-10-CM | POA: Diagnosis not present

## 2018-09-02 DIAGNOSIS — F17200 Nicotine dependence, unspecified, uncomplicated: Secondary | ICD-10-CM | POA: Insufficient documentation

## 2018-09-02 DIAGNOSIS — N50812 Left testicular pain: Secondary | ICD-10-CM | POA: Diagnosis not present

## 2018-09-02 DIAGNOSIS — Z885 Allergy status to narcotic agent status: Secondary | ICD-10-CM | POA: Insufficient documentation

## 2018-09-02 DIAGNOSIS — E291 Testicular hypofunction: Secondary | ICD-10-CM | POA: Insufficient documentation

## 2018-09-02 DIAGNOSIS — Z9049 Acquired absence of other specified parts of digestive tract: Secondary | ICD-10-CM | POA: Diagnosis not present

## 2018-09-02 DIAGNOSIS — E119 Type 2 diabetes mellitus without complications: Secondary | ICD-10-CM | POA: Insufficient documentation

## 2018-09-02 DIAGNOSIS — F329 Major depressive disorder, single episode, unspecified: Secondary | ICD-10-CM | POA: Insufficient documentation

## 2018-09-02 DIAGNOSIS — K219 Gastro-esophageal reflux disease without esophagitis: Secondary | ICD-10-CM | POA: Diagnosis not present

## 2018-09-02 DIAGNOSIS — Z882 Allergy status to sulfonamides status: Secondary | ICD-10-CM | POA: Insufficient documentation

## 2018-09-02 DIAGNOSIS — I10 Essential (primary) hypertension: Secondary | ICD-10-CM | POA: Diagnosis not present

## 2018-09-02 DIAGNOSIS — E1140 Type 2 diabetes mellitus with diabetic neuropathy, unspecified: Secondary | ICD-10-CM | POA: Diagnosis not present

## 2018-09-02 HISTORY — DX: Personal history of other diseases of the nervous system and sense organs: Z86.69

## 2018-09-02 HISTORY — DX: Lesion of ulnar nerve, right upper limb: G56.21

## 2018-09-02 HISTORY — DX: Tremor, unspecified: R25.1

## 2018-09-02 HISTORY — DX: Rheumatoid arthritis, unspecified: M06.9

## 2018-09-02 HISTORY — DX: Personal history of other diseases of the respiratory system: Z87.09

## 2018-09-02 HISTORY — PX: ORCHIECTOMY: SHX2116

## 2018-09-02 LAB — POCT I-STAT 4, (NA,K, GLUC, HGB,HCT)
Glucose, Bld: 128 mg/dL — ABNORMAL HIGH (ref 70–99)
HCT: 42 % (ref 39.0–52.0)
Hemoglobin: 14.3 g/dL (ref 13.0–17.0)
Potassium: 4.3 mmol/L (ref 3.5–5.1)
Sodium: 138 mmol/L (ref 135–145)

## 2018-09-02 LAB — GLUCOSE, CAPILLARY: Glucose-Capillary: 102 mg/dL — ABNORMAL HIGH (ref 70–99)

## 2018-09-02 SURGERY — ORCHIECTOMY
Anesthesia: General | Site: Scrotum | Laterality: Left

## 2018-09-02 MED ORDER — ONDANSETRON HCL 4 MG/2ML IJ SOLN
4.0000 mg | Freq: Once | INTRAMUSCULAR | Status: DC | PRN
  Filled 2018-09-02: qty 2

## 2018-09-02 MED ORDER — PROPOFOL 10 MG/ML IV BOLUS
INTRAVENOUS | Status: DC | PRN
  Administered 2018-09-02: 200 mg via INTRAVENOUS
  Administered 2018-09-02: 50 mg via INTRAVENOUS

## 2018-09-02 MED ORDER — CEFAZOLIN SODIUM-DEXTROSE 2-4 GM/100ML-% IV SOLN
INTRAVENOUS | Status: AC
  Filled 2018-09-02: qty 100

## 2018-09-02 MED ORDER — LACTATED RINGERS IV SOLN
INTRAVENOUS | Status: DC
  Administered 2018-09-02: 11:00:00 via INTRAVENOUS
  Administered 2018-09-02: 50 mL via INTRAVENOUS
  Filled 2018-09-02: qty 1000

## 2018-09-02 MED ORDER — LIDOCAINE 2% (20 MG/ML) 5 ML SYRINGE
INTRAMUSCULAR | Status: AC
  Filled 2018-09-02: qty 5

## 2018-09-02 MED ORDER — BUPIVACAINE-EPINEPHRINE (PF) 0.5% -1:200000 IJ SOLN
INTRAMUSCULAR | Status: DC | PRN
  Administered 2018-09-02: 10 mL

## 2018-09-02 MED ORDER — ONDANSETRON HCL 4 MG/2ML IJ SOLN
INTRAMUSCULAR | Status: DC | PRN
  Administered 2018-09-02: 4 mg via INTRAVENOUS

## 2018-09-02 MED ORDER — FENTANYL CITRATE (PF) 100 MCG/2ML IJ SOLN
INTRAMUSCULAR | Status: AC
  Filled 2018-09-02: qty 2

## 2018-09-02 MED ORDER — PROPOFOL 10 MG/ML IV BOLUS
INTRAVENOUS | Status: AC
  Filled 2018-09-02: qty 40

## 2018-09-02 MED ORDER — OXYCODONE HCL 10 MG PO TABS: 10.0000 mg | ORAL_TABLET | ORAL | 0 refills | Status: DC | PRN

## 2018-09-02 MED ORDER — DEXAMETHASONE SODIUM PHOSPHATE 10 MG/ML IJ SOLN
INTRAMUSCULAR | Status: DC | PRN
  Administered 2018-09-02: 5 mg via INTRAVENOUS

## 2018-09-02 MED ORDER — FENTANYL CITRATE (PF) 100 MCG/2ML IJ SOLN
25.0000 ug | INTRAMUSCULAR | Status: DC | PRN
  Filled 2018-09-02: qty 1

## 2018-09-02 MED ORDER — SODIUM CHLORIDE 0.9 % IR SOLN
Status: DC | PRN
  Administered 2018-09-02: 500 mL

## 2018-09-02 MED ORDER — LIDOCAINE HCL (CARDIAC) PF 100 MG/5ML IV SOSY
PREFILLED_SYRINGE | INTRAVENOUS | Status: DC | PRN
  Administered 2018-09-02: 60 mg via INTRAVENOUS

## 2018-09-02 MED ORDER — ONDANSETRON HCL 4 MG/2ML IJ SOLN
INTRAMUSCULAR | Status: AC
  Filled 2018-09-02: qty 2

## 2018-09-02 MED ORDER — MIDAZOLAM HCL 2 MG/2ML IJ SOLN
INTRAMUSCULAR | Status: DC | PRN
  Administered 2018-09-02: 2 mg via INTRAVENOUS

## 2018-09-02 MED ORDER — DEXAMETHASONE SODIUM PHOSPHATE 10 MG/ML IJ SOLN
INTRAMUSCULAR | Status: AC
  Filled 2018-09-02: qty 1

## 2018-09-02 MED ORDER — MIDAZOLAM HCL 2 MG/2ML IJ SOLN
INTRAMUSCULAR | Status: AC
  Filled 2018-09-02: qty 2

## 2018-09-02 MED ORDER — CEFAZOLIN SODIUM-DEXTROSE 2-4 GM/100ML-% IV SOLN
2.0000 g | Freq: Once | INTRAVENOUS | Status: AC
  Administered 2018-09-02: 2 g via INTRAVENOUS
  Filled 2018-09-02: qty 100

## 2018-09-02 MED ORDER — FENTANYL CITRATE (PF) 100 MCG/2ML IJ SOLN
INTRAMUSCULAR | Status: DC | PRN
  Administered 2018-09-02 (×3): 50 ug via INTRAVENOUS

## 2018-09-02 SURGICAL SUPPLY — 52 items
APL SKNCLS STERI-STRIP NONHPOA (GAUZE/BANDAGES/DRESSINGS)
BENZOIN TINCTURE PRP APPL 2/3 (GAUZE/BANDAGES/DRESSINGS) IMPLANT
BLADE CLIPPER SENSICLIP SURGIC (BLADE) ×2 IMPLANT
BLADE SURG 15 STRL LF DISP TIS (BLADE) ×1 IMPLANT
BLADE SURG 15 STRL SS (BLADE) ×2
BNDG GAUZE ELAST 4 BULKY (GAUZE/BANDAGES/DRESSINGS) ×2 IMPLANT
COVER BACK TABLE 60X90IN (DRAPES) ×2 IMPLANT
COVER MAYO STAND STRL (DRAPES) ×2 IMPLANT
COVER WAND RF STERILE (DRAPES) ×2 IMPLANT
DRAIN PENROSE 18X1/4 LTX STRL (WOUND CARE) ×1 IMPLANT
DRAPE LAPAROTOMY 100X72 PEDS (DRAPES) ×2 IMPLANT
DRSG TEGADERM 4X4.75 (GAUZE/BANDAGES/DRESSINGS) IMPLANT
ELECT REM PT RETURN 9FT ADLT (ELECTROSURGICAL) ×2
ELECTRODE REM PT RTRN 9FT ADLT (ELECTROSURGICAL) ×1 IMPLANT
GAUZE SPONGE 4X4 12PLY STRL LF (GAUZE/BANDAGES/DRESSINGS) ×1 IMPLANT
GLOVE BIO SURGEON STRL SZ 6 (GLOVE) ×1 IMPLANT
GLOVE BIO SURGEON STRL SZ8 (GLOVE) ×2 IMPLANT
GLOVE BIOGEL PI IND STRL 6.5 (GLOVE) IMPLANT
GLOVE BIOGEL PI IND STRL 7.0 (GLOVE) IMPLANT
GLOVE BIOGEL PI INDICATOR 6.5 (GLOVE) ×1
GLOVE BIOGEL PI INDICATOR 7.0 (GLOVE) ×1
GOWN STRL REUS W/TWL LRG LVL3 (GOWN DISPOSABLE) ×1 IMPLANT
GOWN STRL REUS W/TWL XL LVL3 (GOWN DISPOSABLE) ×2 IMPLANT
IV NS IRRIG 3000ML ARTHROMATIC (IV SOLUTION) IMPLANT
KIT TURNOVER CYSTO (KITS) ×2 IMPLANT
NEEDLE HYPO 22GX1.5 SAFETY (NEEDLE) ×1 IMPLANT
NS IRRIG 500ML POUR BTL (IV SOLUTION) ×1 IMPLANT
PACK BASIN DAY SURGERY FS (CUSTOM PROCEDURE TRAY) ×2 IMPLANT
PENCIL BUTTON HOLSTER BLD 10FT (ELECTRODE) ×2 IMPLANT
SPONGE INTESTINAL PEANUT (DISPOSABLE) ×2 IMPLANT
SPONGE LAP 4X18 RFD (DISPOSABLE) ×1 IMPLANT
STRIP CLOSURE SKIN 1/2X4 (GAUZE/BANDAGES/DRESSINGS) IMPLANT
STRIP CLOSURE SKIN 1/4X4 (GAUZE/BANDAGES/DRESSINGS) IMPLANT
SUPPORT SCROTAL LG STRP (MISCELLANEOUS) ×1 IMPLANT
SUT CHROMIC 3 0 SH 27 (SUTURE) ×2 IMPLANT
SUT MNCRL AB 4-0 PS2 18 (SUTURE) ×2 IMPLANT
SUT SILK 0 TIES 10X30 (SUTURE) ×2 IMPLANT
SUT SILK 2 0 SH (SUTURE) ×1 IMPLANT
SUT SILK 2 0 TIES 17X18 (SUTURE)
SUT SILK 2-0 18XBRD TIE BLK (SUTURE) IMPLANT
SUT SILK 3 0 SH 30 (SUTURE) IMPLANT
SUT VIC AB 0 SH 27 (SUTURE) IMPLANT
SUT VIC AB 2-0 CT2 27 (SUTURE) ×2 IMPLANT
SUT VIC AB 3-0 SH 27 (SUTURE) ×4
SUT VIC AB 3-0 SH 27X BRD (SUTURE) ×2 IMPLANT
SYR CONTROL 10ML LL (SYRINGE) ×1 IMPLANT
TOWEL OR 17X26 10 PK STRL BLUE (TOWEL DISPOSABLE) ×3 IMPLANT
TRAY DSU PREP LF (CUSTOM PROCEDURE TRAY) ×2 IMPLANT
TUBE CONNECTING 12X1/4 (SUCTIONS) ×2 IMPLANT
WATER STERILE IRR 3000ML UROMA (IV SOLUTION) IMPLANT
WATER STERILE IRR 500ML POUR (IV SOLUTION) ×1 IMPLANT
YANKAUER SUCT BULB TIP NO VENT (SUCTIONS) ×2 IMPLANT

## 2018-09-02 NOTE — Anesthesia Procedure Notes (Signed)
Procedure Name: LMA Insertion Date/Time: 09/02/2018 9:38 AM Performed by: Raenette Rover, CRNA Pre-anesthesia Checklist: Patient identified, Emergency Drugs available, Suction available and Patient being monitored Patient Re-evaluated:Patient Re-evaluated prior to induction Oxygen Delivery Method: Circle system utilized Preoxygenation: Pre-oxygenation with 100% oxygen Induction Type: IV induction LMA: LMA inserted LMA Size: 4.0 Number of attempts: 1 Placement Confirmation: positive ETCO2,  CO2 detector and breath sounds checked- equal and bilateral Tube secured with: Tape Dental Injury: Teeth and Oropharynx as per pre-operative assessment

## 2018-09-02 NOTE — Transfer of Care (Signed)
Immediate Anesthesia Transfer of Care Note  Patient: Vincent [name]  Procedure(s) Performed: ORCHIECTOMY (Left Scrotum)  Patient Location: PACU  Anesthesia Type:General  Level of Consciousness: drowsy and patient cooperative  Airway & Oxygen Therapy: Patient Spontanous Breathing and Patient connected to nasal cannula oxygen  Post-op Assessment: Report given to RN and Post -op Vital signs reviewed and stable  Post vital signs: Reviewed and stable  Last Vitals:  Vitals Value Taken Time  BP 120/68 09/02/18 1024  Temp    Pulse 90 09/02/18 1026  Resp 13 09/02/18 1026  SpO2 97 % 09/02/18 1026  Vitals shown include unvalidated device data.  Last Pain:  Vitals:   09/02/18 0918  TempSrc: Oral  PainSc: 5       Patients Stated Pain Goal: 4 (66/81/59 [id number])  Complications: No apparent anesthesia complications

## 2018-09-02 NOTE — Op Note (Signed)
PATIENT:  Vincent [name]  PRE-OPERATIVE DIAGNOSIS: Chronic left testicular pain  POST-OPERATIVE DIAGNOSIS: Same  PROCEDURE: Left scrotal orchiectomy  SURGEON:  Claybon Jabs  INDICATION: Vincent [name] is a 53 year old male with chronic left testicular pain.  Conservative management has been ineffective including a cord block.  He requested proceeding with orchiectomy as this was successful in controlling chronic right testicular pain in 12/17.  He does have hypogonadism.  ANESTHESIA:  General  EBL:  Minimal  DRAINS: None  LOCAL MEDICATIONS USED: 1/2 percent Marcaine with epinephrine  SPECIMEN: Left testicle and portion of spermatic cord.  Description of procedure: After informed consent the patient was taken to the operating room and placed on the table in a supine position. General anesthesia was then administered. Once fully anesthetized the genitalia were sterilely prepped and draped in standard fashion. An official timeout was then performed.  Initial examination revealed his right testicular prosthesis in good position.  The left testicle was again noted to be normal to palpation.  I made a midline median raphae scrotal incision and carried this down through the tunica to expose the testicle which appeared entirely normal.  I used blunt technique to dissect tissue from around the cord and gain as much cord length as possible.  I then passed a Kelly clamp through the cord and clamped the cord in 2 equal portions and divided the cord distal to the clamps.  I injected local anesthetic into the proximal portion of the cord and then doubly ligated each cord portion with first a 2-0 Vicryl suture ligature and then a 2-0 silk tie.  This was then allowed to retract and bleeding points within the scrotum were cauterized with electrocautery.  I then closed the deep scrotal tissue with running, locking 3-0 chromic suture.  I injected local anesthetic in the subcutaneous tissue of the incision  and then closed the incision with a running 3-0 chromic.  Neosporin, folded 4 x 4 gauze and a fluffed Curlex were then applied and secured with a scrotal support.  The patient was awakened and taken to the recovery room in stable and satisfactory condition.  He tolerated the procedure well no intraoperative complications.  PLAN OF CARE: Discharge to home after PACU  PATIENT DISPOSITION:  PACU - hemodynamically stable.

## 2018-09-02 NOTE — Anesthesia Preprocedure Evaluation (Addendum)
Anesthesia Evaluation  Patient identified by MRN, date of birth, ID band Patient awake    Reviewed: Allergy & Precautions, NPO status , Patient's Chart, lab work & pertinent test results  Airway Mallampati: II  TM Distance: >3 FB Neck ROM: Full    Dental  (+) Edentulous Upper, Edentulous Lower   Pulmonary Current Smoker,    breath sounds clear to auscultation       Cardiovascular hypertension,  Rhythm:Regular Rate:Normal     Neuro/Psych    GI/Hepatic   Endo/Other  diabetes  Renal/GU      Musculoskeletal   Abdominal   Peds  Hematology   Anesthesia Other Findings   Reproductive/Obstetrics                            Anesthesia Physical Anesthesia Plan  ASA: III  Anesthesia Plan: General   Post-op Pain Management:    Induction: Intravenous  PONV Risk Score and Plan: Ondansetron  Airway Management Planned: LMA  Additional Equipment:   Intra-op Plan:   Post-operative Plan:   Informed Consent: I have reviewed the patients History and Physical, chart, labs and discussed the procedure including the risks, benefits and alternatives for the proposed anesthesia with the patient or authorized representative who has indicated his/her understanding and acceptance.     Dental advisory given  Plan Discussed with: Anesthesiologist and CRNA  Anesthesia Plan Comments:         Anesthesia Quick Evaluation

## 2018-09-02 NOTE — Discharge Instructions (Signed)
Scrotal surgery postoperative instructions  Wound:  In most cases your incision will have absorbable sutures that will dissolve within the first 10-20 days. Some will fall out even earlier. Expect some redness as the sutures dissolved but this should occur only around the sutures. If there is generalized redness, especially with increasing pain or swelling, let us know. The scrotum will very likely get "black and blue" as the blood in the tissues spread. Sometimes the whole scrotum will turn colors. The black and blue is followed by a yellow and brown color. In time, all the discoloration will go away. In some cases some firm swelling in the area of the testicle may persist for up to 4-6 weeks after the surgery and is considered normal in most cases.  Diet:  You may return to your normal diet within 24 hours following your surgery. You may note some mild nausea and possibly vomiting the first 6-8 hours following surgery. This is usually due to the side effects of anesthesia, and will disappear quite soon. I would suggest clear liquids and a very light meal the first evening following your surgery.  Activity:  Your physical activity should be restricted the first 48 hours. During that time you should remain relatively inactive, moving about only when necessary. During the first 7-10 days following surgery he should avoid lifting any heavy objects (anything greater than 15 pounds), and avoid strenuous exercise. If you work, ask us specifically about your restrictions, both for work and home. We will write a note to your employer if needed.  You should plan to wear a tight pair of jockey shorts or an athletic supporter for the first 4-5 days, even to sleep. This will keep the scrotum immobilized to some degree and keep the swelling down.  Ice packs should be placed on and off over the scrotum for the first 48 hours. Frozen peas or corn in a ZipLock bag can be frozen, used and re-frozen. Fifteen minutes  on and 15 minutes off is a reasonable schedule. The ice is a good pain reliever and keeps the swelling down.  Hygiene:  You may shower 48 hours after your surgery. Tub bathing should be restricted until the seventh day.   Medication:  You will be sent home with some type of pain medication. In many cases you will be sent home with a narcotic pain pill (hydrococone or oxycodone). If the pain is not too bad, you may take either Tylenol (acetaminophen) or Advil (ibuprofen) which contain no narcotic agents, and might be tolerated a little better, with fewer side effects. If the pain medication you are sent home with does not control the pain, you will have to let us know. Some narcotic pain medications cannot be given or refilled by a phone call to a pharmacy.  Problems you should report to us:   Fever of 101.0 degrees Fahrenheit or greater.  Moderate or severe swelling under the skin incision or involving the scrotum.  Drug reaction such as hives, a rash, nausea or vomiting.    Post Anesthesia Home Care Instructions  Activity: Get plenty of rest for the remainder of the day. A responsible individual must stay with you for 24 hours following the procedure.  For the next 24 hours, DO NOT: -Drive a car -Operate machinery -Drink alcoholic beverages -Take any medication unless instructed by your physician -Make any legal decisions or sign important papers.  Meals: Start with liquid foods such as gelatin or soup. Progress to regular foods as   greasy, spicy, heavy foods. If nausea and/or vomiting occur, drink only clear liquids until the nausea and/or vomiting subsides. Call your physician if vomiting continues.  Special Instructions/Symptoms: Your throat may feel dry or sore from the anesthesia or the breathing tube placed in your throat during surgery. If this causes discomfort, gargle with warm salt water. The discomfort should disappear within 24 hours.  If you had a  scopolamine patch placed behind your ear for the management of post- operative nausea and/or vomiting:  1. The medication in the patch is effective for 72 hours, after which it should be removed.  Wrap patch in a tissue and discard in the trash. Wash hands thoroughly with soap and water. 2. You may remove the patch earlier than 72 hours if you experience unpleasant side effects which may include dry mouth, dizziness or visual disturbances. 3. Avoid touching the patch. Wash your hands with soap and water after contact with the patch.

## 2018-09-02 NOTE — Anesthesia Postprocedure Evaluation (Signed)
Anesthesia Post Note  Patient: Vincent [name]  Procedure(s) Performed: ORCHIECTOMY (Left Scrotum)     Patient location during evaluation: PACU Anesthesia Type: General Level of consciousness: awake and alert Pain management: pain level controlled Vital Signs Assessment: post-procedure vital signs reviewed and stable Respiratory status: spontaneous breathing, nonlabored ventilation, respiratory function stable and patient connected to nasal cannula oxygen Cardiovascular status: blood pressure returned to baseline and stable Postop Assessment: no apparent nausea or vomiting Anesthetic complications: no    Last Vitals:  Vitals:   09/02/18 1100 09/02/18 1200  BP: 102/67 134/76  Pulse: 86 86  Resp: 13 14  Temp:  36.5 C  SpO2: 93%     Last Pain:  Vitals:   09/02/18 1145  TempSrc:   PainSc: 0-No pain                 Filomena Pokorney COKER

## 2018-09-03 ENCOUNTER — Encounter (HOSPITAL_BASED_OUTPATIENT_CLINIC_OR_DEPARTMENT_OTHER): Payer: Self-pay | Admitting: Urology

## 2018-09-03 LAB — TESTOSTERONE: Testosterone: 215 ng/dL — ABNORMAL LOW (ref 264–916)

## 2018-09-16 ENCOUNTER — Other Ambulatory Visit: Payer: Self-pay

## 2018-09-16 ENCOUNTER — Ambulatory Visit (INDEPENDENT_AMBULATORY_CARE_PROVIDER_SITE_OTHER): Payer: Medicare Other | Admitting: Psychiatry

## 2018-09-16 ENCOUNTER — Encounter (HOSPITAL_COMMUNITY): Payer: Self-pay | Admitting: Psychiatry

## 2018-09-16 ENCOUNTER — Encounter (HOSPITAL_COMMUNITY): Payer: Self-pay

## 2018-09-16 DIAGNOSIS — F4312 Post-traumatic stress disorder, chronic: Secondary | ICD-10-CM

## 2018-09-16 DIAGNOSIS — F3181 Bipolar II disorder: Secondary | ICD-10-CM

## 2018-09-16 MED ORDER — ARIPIPRAZOLE 5 MG PO TABS: 5.0000 mg | ORAL_TABLET | Freq: Every day | ORAL | 2 refills | Status: DC

## 2018-09-16 MED ORDER — MIRTAZAPINE 30 MG PO TABS: 30.0000 mg | ORAL_TABLET | Freq: Every day | ORAL | 2 refills | Status: DC

## 2018-09-16 MED ORDER — VENLAFAXINE HCL ER 150 MG PO CP24: 300.0000 mg | ORAL_CAPSULE | Freq: Every day | ORAL | 2 refills | Status: DC

## 2018-09-16 MED ORDER — LAMOTRIGINE 150 MG PO TABS: 300.0000 mg | ORAL_TABLET | ORAL | 2 refills | Status: DC

## 2018-09-16 MED ORDER — BUSPIRONE HCL 5 MG PO TABS: 5.0000 mg | ORAL_TABLET | Freq: Two times a day (BID) | ORAL | 2 refills | Status: DC

## 2018-09-16 NOTE — Progress Notes (Signed)
Virtual Visit via Telephone Note  I connected with Vincent [name] on 09/16/18 at  8:20 AM EDT by telephone and verified that I am speaking with the correct person using two identifiers.   I discussed the limitations, risks, security and privacy concerns of performing an evaluation and management service by telephone and the availability of in person appointments. I also discussed with the patient that there may be a patient responsible charge related to this service. The patient expressed understanding and agreed to proceed.   History of Present Illness: Patient was evaluated by phone session.  He recently has testicular surgery and he is recovering from it.  Feel the current medicine is working well.  He lives with his wife who is supportive.  He is sleeping better and denies any recent nightmares or any flashback.  He does not want to change his medication.  He denies any irritability, anger, mania, mood swing or any ups and downs in his mood.  He denies any tremors or shakes.  He really like Abilify which is helping his mood and anger.  He has chronic pain and diabetes but he feel his blood sugar is under control.  His last chemistry shows sugar 128.  He admitted not able to see [name] since the surgery but like to resume the therapy.  Denies any crying spells or any feeling of hopelessness or worthlessness.  Denies any panic attack.  His sleep is much better.  His energy level is good sometimes he has difficulty remembering things.  Past Psychiatric History:Reviewed. H/Oone brief hospitalization at [facility] due to suicidal thoughts. No h/osuicidal attempt. H/Oanger, anxiety, PTSD and heavy substance use. 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 Tried Depakote, Zoloft, lithium, Prozac and Xanax.  Recent Results (from the past 2160 hour(s))  SARS Coronavirus 2 (Performed in [place] hospital lab)     Status: None   Collection Time: 08/29/18  9:30 AM   Specimen: Nasal  Swab  Result Value Ref Range   SARS Coronavirus 2 NEGATIVE NEGATIVE    Comment: (NOTE) SARS-CoV-2 target nucleic acids are NOT DETECTED. The SARS-CoV-2 RNA is generally detectable in upper and lower respiratory specimens during the acute phase of infection. Negative results do not preclude SARS-CoV-2 infection, do not rule out co-infections with other pathogens, and should not be used as the sole basis for treatment or other patient management decisions. Negative results must be combined with clinical observations, patient history, and epidemiological information. The expected result is Negative. Fact Sheet for Patients: TrashEliminator.se Fact Sheet for Healthcare Providers: WhoisBlogging.ch This test is not yet approved or cleared by the [place] FDA and  has been authorized for detection and/or diagnosis of SARS-CoV-2 by FDA under an Emergency Use Authorization (EUA). This EUA will remain  in effect (meaning this test can be used) for the duration of the COVID-19 declaration under Section 56 4(b)(1) of the Act, 21 U.S.C. section 360bbb-3(b)(1), unless the authorization is terminated or revoked sooner. Performed at [facility], [place] [address] [place], [place] [address]   Testosterone     Status: Abnormal   Collection Time: 09/02/18  9:12 AM  Result Value Ref Range   Testosterone 215 (L) 264 - 916 ng/dL    Comment: (NOTE) Adult male reference interval is based on a population of healthy nonobese males (BMI <30) between 55 and 42 years old. [place], [place] [phone]. PMID: [id number]. Performed At: [facility] [address] [place], [place] [id number]  Rush Farmer MD TD:[id number]   I-STAT 4, (NA,K, GLUC, HGB,HCT)     Status: Abnormal   Collection Time: 09/02/18  9:17 AM  Result Value Ref Range   Sodium 138 135 - 145 mmol/L   Potassium 4.3 3.5 - 5.1 mmol/L   Glucose, Bld 128 (H) 70 - 99  mg/dL   HCT 42.0 39.0 - 52.0 %   Hemoglobin 14.3 13.0 - 17.0 g/dL  Glucose, capillary     Status: Abnormal   Collection Time: 09/02/18 10:31 AM  Result Value Ref Range   Glucose-Capillary 102 (H) 70 - 99 mg/dL     Psychiatric Specialty Exam: Physical Exam  ROS  There were no vitals taken for this visit.There is no height or weight on file to calculate BMI.  General Appearance: NA  Eye Contact:  NA  Speech:  Slow  Volume:  Decreased  Mood:  Anxious  Affect:  NA  Thought Process:  Goal Directed  Orientation:  Full (Time, Place, and Person)  Thought Content:  Rumination  Suicidal Thoughts:  No  Homicidal Thoughts:  No  Memory:  Immediate;   Fair Recent;   Fair Remote;   Good  Judgement:  Good  Insight:  Good  Psychomotor Activity:  NA  Concentration:  Concentration: Fair and Attention Span: Fair  Recall:  Good  Fund of Knowledge:  Good  Language:  Good  Akathisia:  No  Handed:  Right  AIMS (if indicated):     Assets:  Communication Skills Desire for Improvement Housing Resilience Social Support  ADL's:  Intact  Cognition:  WNL  Sleep:   fair      Assessment and Plan: Bipolar disorder type I.  Posttraumatic stress disorder.  Generalized anxiety disorder.  I reviewed blood work results.  Patient doing better on his current medication.  He is slowly recovering from his testicular surgery.  Like to continue his current medication which is working for him.  I will continue BuSpar 5 mg twice a day, Effexor XR 150 mg twice a day Abilify 5 mg daily, Lamictal 300 mg daily and [name] 30 mg at bedtime.  Discussed polypharmacy.  We will consider lowering his medication on his next appointment.  Patient denies any tremors, rash, itching or any shakes.  Recommend to restart therapy with [name].  Recommend to call us back if he has any question or any concern.  Follow-up in 3 months.  Follow Up Instructions:    I discussed the assessment and treatment plan with the  patient. The patient was provided an opportunity to ask questions and all were answered. The patient agreed with the plan and demonstrated an understanding of the instructions.   The patient was advised to call back or seek an in-person evaluation if the symptoms worsen or if the condition fails to improve as anticipated.  I provided 20 minutes of non-face-to-face time during this encounter.   Kathlee Nations, MD

## 2018-10-19 ENCOUNTER — Other Ambulatory Visit: Payer: Self-pay

## 2018-10-19 ENCOUNTER — Emergency Department (HOSPITAL_COMMUNITY): Payer: No Typology Code available for payment source

## 2018-10-19 ENCOUNTER — Encounter (HOSPITAL_COMMUNITY): Payer: Self-pay | Admitting: Radiology

## 2018-10-19 ENCOUNTER — Inpatient Hospital Stay (HOSPITAL_COMMUNITY)
Admission: EM | Admit: 2018-10-19 | Discharge: 2018-10-20 | DRG: 069 | Disposition: A | Discharge status: Home Health | Payer: No Typology Code available for payment source | Attending: Family Medicine | Admitting: Family Medicine

## 2018-10-19 DIAGNOSIS — E1142 Type 2 diabetes mellitus with diabetic polyneuropathy: Secondary | ICD-10-CM | POA: Diagnosis present

## 2018-10-19 DIAGNOSIS — Z7982 Long term (current) use of aspirin: Secondary | ICD-10-CM

## 2018-10-19 DIAGNOSIS — M069 Rheumatoid arthritis, unspecified: Secondary | ICD-10-CM | POA: Diagnosis present

## 2018-10-19 DIAGNOSIS — Z8551 Personal history of malignant neoplasm of bladder: Secondary | ICD-10-CM

## 2018-10-19 DIAGNOSIS — E781 Pure hyperglyceridemia: Secondary | ICD-10-CM | POA: Diagnosis present

## 2018-10-19 DIAGNOSIS — Z833 Family history of diabetes mellitus: Secondary | ICD-10-CM

## 2018-10-19 DIAGNOSIS — I6521 Occlusion and stenosis of right carotid artery: Secondary | ICD-10-CM | POA: Diagnosis not present

## 2018-10-19 DIAGNOSIS — E1100 Type 2 diabetes mellitus with hyperosmolarity without nonketotic hyperglycemic-hyperosmolar coma (NKHHC): Secondary | ICD-10-CM | POA: Diagnosis present

## 2018-10-19 DIAGNOSIS — E119 Type 2 diabetes mellitus without complications: Secondary | ICD-10-CM | POA: Diagnosis present

## 2018-10-19 DIAGNOSIS — F3181 Bipolar II disorder: Secondary | ICD-10-CM | POA: Diagnosis present

## 2018-10-19 DIAGNOSIS — Z79899 Other long term (current) drug therapy: Secondary | ICD-10-CM

## 2018-10-19 DIAGNOSIS — E1143 Type 2 diabetes mellitus with diabetic autonomic (poly)neuropathy: Secondary | ICD-10-CM | POA: Diagnosis present

## 2018-10-19 DIAGNOSIS — I2510 Atherosclerotic heart disease of native coronary artery without angina pectoris: Secondary | ICD-10-CM | POA: Diagnosis present

## 2018-10-19 DIAGNOSIS — R202 Paresthesia of skin: Secondary | ICD-10-CM

## 2018-10-19 DIAGNOSIS — Z79890 Hormone replacement therapy (postmenopausal): Secondary | ICD-10-CM

## 2018-10-19 DIAGNOSIS — E669 Obesity, unspecified: Secondary | ICD-10-CM | POA: Diagnosis present

## 2018-10-19 DIAGNOSIS — F4310 Post-traumatic stress disorder, unspecified: Secondary | ICD-10-CM | POA: Diagnosis not present

## 2018-10-19 DIAGNOSIS — G459 Transient cerebral ischemic attack, unspecified: Secondary | ICD-10-CM | POA: Diagnosis not present

## 2018-10-19 DIAGNOSIS — E039 Hypothyroidism, unspecified: Secondary | ICD-10-CM | POA: Diagnosis not present

## 2018-10-19 DIAGNOSIS — Z886 Allergy status to analgesic agent status: Secondary | ICD-10-CM

## 2018-10-19 DIAGNOSIS — R200 Anesthesia of skin: Secondary | ICD-10-CM | POA: Diagnosis present

## 2018-10-19 DIAGNOSIS — Z79891 Long term (current) use of opiate analgesic: Secondary | ICD-10-CM | POA: Diagnosis not present

## 2018-10-19 DIAGNOSIS — R072 Precordial pain: Secondary | ICD-10-CM | POA: Diagnosis not present

## 2018-10-19 DIAGNOSIS — Z794 Long term (current) use of insulin: Secondary | ICD-10-CM

## 2018-10-19 DIAGNOSIS — R739 Hyperglycemia, unspecified: Secondary | ICD-10-CM

## 2018-10-19 DIAGNOSIS — Z20828 Contact with and (suspected) exposure to other viral communicable diseases: Secondary | ICD-10-CM | POA: Diagnosis present

## 2018-10-19 DIAGNOSIS — J449 Chronic obstructive pulmonary disease, unspecified: Secondary | ICD-10-CM | POA: Diagnosis present

## 2018-10-19 DIAGNOSIS — Z8249 Family history of ischemic heart disease and other diseases of the circulatory system: Secondary | ICD-10-CM

## 2018-10-19 DIAGNOSIS — G40909 Epilepsy, unspecified, not intractable, without status epilepticus: Secondary | ICD-10-CM | POA: Diagnosis present

## 2018-10-19 DIAGNOSIS — R079 Chest pain, unspecified: Secondary | ICD-10-CM | POA: Diagnosis not present

## 2018-10-19 DIAGNOSIS — E1165 Type 2 diabetes mellitus with hyperglycemia: Secondary | ICD-10-CM | POA: Diagnosis not present

## 2018-10-19 DIAGNOSIS — G40919 Epilepsy, unspecified, intractable, without status epilepticus: Secondary | ICD-10-CM

## 2018-10-19 DIAGNOSIS — I10 Essential (primary) hypertension: Secondary | ICD-10-CM | POA: Diagnosis present

## 2018-10-19 DIAGNOSIS — K219 Gastro-esophageal reflux disease without esophagitis: Secondary | ICD-10-CM | POA: Diagnosis not present

## 2018-10-19 DIAGNOSIS — Z885 Allergy status to narcotic agent status: Secondary | ICD-10-CM

## 2018-10-19 DIAGNOSIS — Z8739 Personal history of other diseases of the musculoskeletal system and connective tissue: Secondary | ICD-10-CM

## 2018-10-19 DIAGNOSIS — R531 Weakness: Secondary | ICD-10-CM | POA: Diagnosis not present

## 2018-10-19 DIAGNOSIS — G4733 Obstructive sleep apnea (adult) (pediatric): Secondary | ICD-10-CM | POA: Diagnosis present

## 2018-10-19 DIAGNOSIS — R0789 Other chest pain: Secondary | ICD-10-CM | POA: Diagnosis present

## 2018-10-19 DIAGNOSIS — K3184 Gastroparesis: Secondary | ICD-10-CM | POA: Diagnosis present

## 2018-10-19 DIAGNOSIS — F419 Anxiety disorder, unspecified: Secondary | ICD-10-CM | POA: Diagnosis present

## 2018-10-19 DIAGNOSIS — Z6831 Body mass index (BMI) 31.0-31.9, adult: Secondary | ICD-10-CM

## 2018-10-19 DIAGNOSIS — Z882 Allergy status to sulfonamides status: Secondary | ICD-10-CM

## 2018-10-19 DIAGNOSIS — E785 Hyperlipidemia, unspecified: Secondary | ICD-10-CM | POA: Diagnosis present

## 2018-10-19 DIAGNOSIS — Z9221 Personal history of antineoplastic chemotherapy: Secondary | ICD-10-CM

## 2018-10-19 DIAGNOSIS — Z8711 Personal history of peptic ulcer disease: Secondary | ICD-10-CM

## 2018-10-19 DIAGNOSIS — Z8673 Personal history of transient ischemic attack (TIA), and cerebral infarction without residual deficits: Secondary | ICD-10-CM

## 2018-10-19 DIAGNOSIS — F17210 Nicotine dependence, cigarettes, uncomplicated: Secondary | ICD-10-CM | POA: Diagnosis not present

## 2018-10-19 DIAGNOSIS — I6389 Other cerebral infarction: Secondary | ICD-10-CM | POA: Diagnosis not present

## 2018-10-19 DIAGNOSIS — G894 Chronic pain syndrome: Secondary | ICD-10-CM | POA: Diagnosis present

## 2018-10-19 LAB — URINALYSIS, ROUTINE W REFLEX MICROSCOPIC
Bacteria, UA: NONE SEEN
Bilirubin Urine: NEGATIVE
Glucose, UA: >=500 mg/dL — AB
Hgb urine dipstick: NEGATIVE
Ketones, ur: NEGATIVE mg/dL
Leukocytes,Ua: NEGATIVE
Nitrite: NEGATIVE
Protein, ur: NEGATIVE mg/dL
Specific Gravity, Urine: 1.032 — ABNORMAL HIGH (ref 1.005–1.030)
pH: 6 (ref 5.0–8.0)

## 2018-10-19 LAB — SARS CORONAVIRUS 2 BY RT PCR (HOSPITAL ORDER, PERFORMED IN ~~LOC~~ HOSPITAL LAB): SARS Coronavirus 2: NEGATIVE

## 2018-10-19 LAB — CBC WITH DIFFERENTIAL/PLATELET
Abs Immature Granulocytes: 0.05 10*3/uL (ref 0.00–0.07)
Basophils Absolute: 0.1 10*3/uL (ref 0.0–0.1)
Basophils Relative: 1 %
Eosinophils Absolute: 0 10*3/uL (ref 0.0–0.5)
Eosinophils Relative: 0 %
HCT: 48.6 % (ref 39.0–52.0)
Hemoglobin: 17.1 g/dL — ABNORMAL HIGH (ref 13.0–17.0)
Immature Granulocytes: 1 %
Lymphocytes Relative: 26 %
Lymphs Abs: 2.3 10*3/uL (ref 0.7–4.0)
MCH: 29.6 pg (ref 26.0–34.0)
MCHC: 35.2 g/dL (ref 30.0–36.0)
MCV: 84.1 fL (ref 80.0–100.0)
Monocytes Absolute: 0.5 10*3/uL (ref 0.1–1.0)
Monocytes Relative: 6 %
Neutro Abs: 5.9 10*3/uL (ref 1.7–7.7)
Neutrophils Relative %: 66 %
Platelets: 243 10*3/uL (ref 150–400)
RBC: 5.78 MIL/uL (ref 4.22–5.81)
RDW: 12 % (ref 11.5–15.5)
WBC: 8.8 10*3/uL (ref 4.0–10.5)
nRBC: 0 % (ref 0.0–0.2)

## 2018-10-19 LAB — COMPREHENSIVE METABOLIC PANEL
ALT: 19 U/L (ref 0–44)
AST: 16 U/L (ref 15–41)
Albumin: 4.2 g/dL (ref 3.5–5.0)
Alkaline Phosphatase: 65 U/L (ref 38–126)
Anion gap: 15 (ref 5–15)
BUN: 15 mg/dL (ref 6–20)
CO2: 19 mmol/L — ABNORMAL LOW (ref 22–32)
Calcium: 9.5 mg/dL (ref 8.9–10.3)
Chloride: 94 mmol/L — ABNORMAL LOW (ref 98–111)
Creatinine, Ser: 1.29 mg/dL — ABNORMAL HIGH (ref 0.61–1.24)
GFR calc Af Amer: >60 mL/min (ref >60)
GFR calc non Af Amer: >60 mL/min (ref >60)
Glucose, Bld: 679 mg/dL (ref 70–99)
Potassium: 4.8 mmol/L (ref 3.5–5.1)
Sodium: 128 mmol/L — ABNORMAL LOW (ref 135–145)
Total Bilirubin: 0.9 mg/dL (ref 0.3–1.2)
Total Protein: 6.8 g/dL (ref 6.5–8.1)

## 2018-10-19 LAB — CBG MONITORING, ED
Glucose-Capillary: >600 mg/dL (ref 70–99)
Glucose-Capillary: 449 mg/dL — ABNORMAL HIGH (ref 70–99)
Glucose-Capillary: 333 mg/dL — ABNORMAL HIGH (ref 70–99)
Glucose-Capillary: 276 mg/dL — ABNORMAL HIGH (ref 70–99)
Glucose-Capillary: 245 mg/dL — ABNORMAL HIGH (ref 70–99)

## 2018-10-19 LAB — RAPID URINE DRUG SCREEN, HOSP PERFORMED
Amphetamines: NOT DETECTED
Barbiturates: NOT DETECTED
Benzodiazepines: NOT DETECTED
Cocaine: NOT DETECTED
Opiates: NOT DETECTED
Tetrahydrocannabinol: NOT DETECTED

## 2018-10-19 LAB — ETHANOL: Alcohol, Ethyl (B): <10 mg/dL (ref <10)

## 2018-10-19 LAB — BASIC METABOLIC PANEL
Anion gap: 10 (ref 5–15)
BUN: 12 mg/dL (ref 6–20)
CO2: 22 mmol/L (ref 22–32)
Calcium: 9 mg/dL (ref 8.9–10.3)
Chloride: 103 mmol/L (ref 98–111)
Creatinine, Ser: 1.02 mg/dL (ref 0.61–1.24)
GFR calc Af Amer: >60 mL/min (ref >60)
GFR calc non Af Amer: >60 mL/min (ref >60)
Glucose, Bld: 272 mg/dL — ABNORMAL HIGH (ref 70–99)
Potassium: 3.9 mmol/L (ref 3.5–5.1)
Sodium: 135 mmol/L (ref 135–145)

## 2018-10-19 LAB — TROPONIN I (HIGH SENSITIVITY)
Troponin I (High Sensitivity): 4 ng/L (ref <18)
Troponin I (High Sensitivity): 5 ng/L (ref <18)

## 2018-10-19 LAB — I-STAT CHEM 8, ED
BUN: 18 mg/dL (ref 6–20)
Calcium, Ion: 1.11 mmol/L — ABNORMAL LOW (ref 1.15–1.40)
Chloride: 98 mmol/L (ref 98–111)
Creatinine, Ser: 1.1 mg/dL (ref 0.61–1.24)
Glucose, Bld: 668 mg/dL (ref 70–99)
HCT: 50 % (ref 39.0–52.0)
Hemoglobin: 17 g/dL (ref 13.0–17.0)
Potassium: 5 mmol/L (ref 3.5–5.1)
Sodium: 128 mmol/L — ABNORMAL LOW (ref 135–145)
TCO2: 20 mmol/L — ABNORMAL LOW (ref 22–32)

## 2018-10-19 LAB — PROTIME-INR
INR: 1 (ref 0.8–1.2)
Prothrombin Time: 12.8 seconds (ref 11.4–15.2)

## 2018-10-19 LAB — HEMOGLOBIN A1C
Hgb A1c MFr Bld: 11.3 % — ABNORMAL HIGH (ref 4.8–5.6)
Mean Plasma Glucose: 277.61 mg/dL

## 2018-10-19 LAB — APTT: aPTT: 31 seconds (ref 24–36)

## 2018-10-19 LAB — GLUCOSE, CAPILLARY: Glucose-Capillary: 242 mg/dL — ABNORMAL HIGH (ref 70–99)

## 2018-10-19 MED ORDER — ENOXAPARIN SODIUM 40 MG/0.4ML ~~LOC~~ SOLN
40.0000 mg | SUBCUTANEOUS | Status: DC
  Administered 2018-10-19: 40 mg via SUBCUTANEOUS
  Filled 2018-10-19: qty 0.4

## 2018-10-19 MED ORDER — VENLAFAXINE HCL ER 150 MG PO CP24
300.0000 mg | ORAL_CAPSULE | Freq: Every day | ORAL | Status: DC
  Administered 2018-10-20: 300 mg via ORAL
  Filled 2018-10-19 (×2): qty 2

## 2018-10-19 MED ORDER — DEXTROSE-NACL 5-0.45 % IV SOLN: INTRAVENOUS | Status: DC

## 2018-10-19 MED ORDER — INSULIN ASPART 100 UNIT/ML ~~LOC~~ SOLN
5.0000 [IU] | Freq: Once | SUBCUTANEOUS | Status: AC
  Administered 2018-10-19: 5 [IU] via SUBCUTANEOUS

## 2018-10-19 MED ORDER — SODIUM CHLORIDE 0.9 % IV BOLUS
1000.0000 mL | Freq: Once | INTRAVENOUS | Status: AC
  Administered 2018-10-19: 1000 mL via INTRAVENOUS

## 2018-10-19 MED ORDER — LEVOTHYROXINE SODIUM 25 MCG PO TABS
25.0000 ug | ORAL_TABLET | Freq: Every day | ORAL | Status: DC
  Administered 2018-10-20: 25 ug via ORAL
  Filled 2018-10-19: qty 1

## 2018-10-19 MED ORDER — PANTOPRAZOLE SODIUM 40 MG PO TBEC
40.0000 mg | DELAYED_RELEASE_TABLET | Freq: Every day | ORAL | Status: DC
  Administered 2018-10-20: 40 mg via ORAL
  Filled 2018-10-19: qty 1

## 2018-10-19 MED ORDER — LAMOTRIGINE 100 MG PO TABS
300.0000 mg | ORAL_TABLET | Freq: Every day | ORAL | Status: DC
  Administered 2018-10-20: 300 mg via ORAL
  Filled 2018-10-19: qty 3

## 2018-10-19 MED ORDER — FENTANYL 75 MCG/HR TD PT72
1.0000 | MEDICATED_PATCH | TRANSDERMAL | Status: DC
  Administered 2018-10-20: 1 via TRANSDERMAL
  Filled 2018-10-19: qty 1

## 2018-10-19 MED ORDER — ASPIRIN EC 325 MG PO TBEC
325.0000 mg | DELAYED_RELEASE_TABLET | Freq: Once | ORAL | Status: AC
  Administered 2018-10-19: 325 mg via ORAL
  Filled 2018-10-19: qty 1

## 2018-10-19 MED ORDER — ARIPIPRAZOLE 5 MG PO TABS
5.0000 mg | ORAL_TABLET | Freq: Every day | ORAL | Status: DC
  Administered 2018-10-20: 09:00:00 5 mg via ORAL
  Filled 2018-10-19: qty 1

## 2018-10-19 MED ORDER — GABAPENTIN 600 MG PO TABS
600.0000 mg | ORAL_TABLET | Freq: Two times a day (BID) | ORAL | Status: DC
  Administered 2018-10-19 – 2018-10-20 (×2): 600 mg via ORAL
  Filled 2018-10-19 (×2): qty 1

## 2018-10-19 MED ORDER — POTASSIUM CHLORIDE 10 MEQ/100ML IV SOLN
10.0000 meq | INTRAVENOUS | Status: AC
  Administered 2018-10-19 (×2): 10 meq via INTRAVENOUS
  Filled 2018-10-19 (×2): qty 100

## 2018-10-19 MED ORDER — FENOFIBRATE 160 MG PO TABS
160.0000 mg | ORAL_TABLET | Freq: Every day | ORAL | Status: DC
  Administered 2018-10-20: 09:00:00 160 mg via ORAL
  Filled 2018-10-19: qty 1

## 2018-10-19 MED ORDER — INSULIN REGULAR(HUMAN) IN NACL 100-0.9 UT/100ML-% IV SOLN: INTRAVENOUS | Status: DC

## 2018-10-19 MED ORDER — ASPIRIN EC 325 MG PO TBEC: 325.0000 mg | DELAYED_RELEASE_TABLET | Freq: Every day | ORAL | Status: DC

## 2018-10-19 MED ORDER — CLOPIDOGREL BISULFATE 300 MG PO TABS
300.0000 mg | ORAL_TABLET | Freq: Once | ORAL | Status: AC
  Administered 2018-10-19: 300 mg via ORAL
  Filled 2018-10-19: qty 1

## 2018-10-19 MED ORDER — INSULIN GLARGINE 100 UNIT/ML ~~LOC~~ SOLN
70.0000 [IU] | Freq: Two times a day (BID) | SUBCUTANEOUS | Status: DC
  Administered 2018-10-19 – 2018-10-20 (×2): 70 [IU] via SUBCUTANEOUS
  Filled 2018-10-19 (×3): qty 0.7

## 2018-10-19 MED ORDER — ASPIRIN 300 MG RE SUPP: 300.0000 mg | Freq: Every day | RECTAL | Status: DC

## 2018-10-19 MED ORDER — ISOSORBIDE MONONITRATE ER 60 MG PO TB24
120.0000 mg | ORAL_TABLET | Freq: Every day | ORAL | Status: DC
  Administered 2018-10-20: 120 mg via ORAL
  Filled 2018-10-19: qty 2

## 2018-10-19 MED ORDER — CLOPIDOGREL BISULFATE 75 MG PO TABS
75.0000 mg | ORAL_TABLET | Freq: Every day | ORAL | Status: DC
  Administered 2018-10-20: 75 mg via ORAL
  Filled 2018-10-19: qty 1

## 2018-10-19 MED ORDER — CYCLOBENZAPRINE HCL 10 MG PO TABS: 10.0000 mg | ORAL_TABLET | Freq: Three times a day (TID) | ORAL | Status: DC | PRN

## 2018-10-19 MED ORDER — BUSPIRONE HCL 5 MG PO TABS
5.0000 mg | ORAL_TABLET | Freq: Two times a day (BID) | ORAL | Status: DC
  Administered 2018-10-19 – 2018-10-20 (×2): 5 mg via ORAL
  Filled 2018-10-19 (×2): qty 1

## 2018-10-19 MED ORDER — INSULIN REGULAR BOLUS VIA INFUSION
0.0000 [IU] | Freq: Three times a day (TID) | INTRAVENOUS | Status: DC
  Filled 2018-10-19: qty 10

## 2018-10-19 MED ORDER — HYDROXYCHLOROQUINE SULFATE 200 MG PO TABS
200.0000 mg | ORAL_TABLET | Freq: Two times a day (BID) | ORAL | Status: DC
  Administered 2018-10-19 – 2018-10-20 (×2): 200 mg via ORAL
  Filled 2018-10-19 (×2): qty 1

## 2018-10-19 MED ORDER — IOHEXOL 350 MG/ML SOLN
100.0000 mL | Freq: Once | INTRAVENOUS | Status: AC | PRN
  Administered 2018-10-19: 100 mL via INTRAVENOUS

## 2018-10-19 MED ORDER — INSULIN ASPART 100 UNIT/ML ~~LOC~~ SOLN
0.0000 [IU] | Freq: Three times a day (TID) | SUBCUTANEOUS | Status: DC
  Administered 2018-10-20: 15 [IU] via SUBCUTANEOUS
  Administered 2018-10-20: 2 [IU] via SUBCUTANEOUS

## 2018-10-19 MED ORDER — ASPIRIN 325 MG PO TABS
325.0000 mg | ORAL_TABLET | Freq: Every day | ORAL | Status: DC
  Administered 2018-10-20: 325 mg via ORAL
  Filled 2018-10-19: qty 1

## 2018-10-19 MED ORDER — SODIUM CHLORIDE 0.9 % IV SOLN: INTRAVENOUS | Status: DC

## 2018-10-19 MED ORDER — DEXTROSE 50 % IV SOLN: 25.0000 mL | INTRAVENOUS | Status: DC | PRN

## 2018-10-19 MED ORDER — MIRTAZAPINE 7.5 MG PO TABS
30.0000 mg | ORAL_TABLET | Freq: Every day | ORAL | Status: DC
  Administered 2018-10-19: 30 mg via ORAL
  Filled 2018-10-19: qty 4

## 2018-10-19 MED ORDER — INSULIN ASPART 100 UNIT/ML ~~LOC~~ SOLN
0.0000 [IU] | Freq: Every day | SUBCUTANEOUS | Status: DC
  Administered 2018-10-19: 2 [IU] via SUBCUTANEOUS

## 2018-10-19 MED ORDER — ATORVASTATIN CALCIUM 40 MG PO TABS
40.0000 mg | ORAL_TABLET | Freq: Every day | ORAL | Status: DC
  Administered 2018-10-19: 40 mg via ORAL
  Filled 2018-10-19: qty 1

## 2018-10-19 MED ORDER — SODIUM CHLORIDE 0.9 % IV BOLUS
1000.0000 mL | Freq: Once | INTRAVENOUS | Status: AC
  Administered 2018-10-19: 15:00:00 1000 mL via INTRAVENOUS

## 2018-10-19 MED ORDER — STROKE: EARLY STAGES OF RECOVERY BOOK
Freq: Once | Status: AC
  Administered 2018-10-20: 09:00:00
  Filled 2018-10-19 (×2): qty 1

## 2018-10-19 MED ORDER — INSULIN ASPART 100 UNIT/ML ~~LOC~~ SOLN
40.0000 [IU] | Freq: Three times a day (TID) | SUBCUTANEOUS | Status: DC
  Administered 2018-10-20 (×2): 40 [IU] via SUBCUTANEOUS

## 2018-10-19 MED ORDER — PREGABALIN 75 MG PO CAPS
150.0000 mg | ORAL_CAPSULE | Freq: Once | ORAL | Status: AC
  Administered 2018-10-19: 150 mg via ORAL
  Filled 2018-10-19: qty 2

## 2018-10-19 NOTE — ED Provider Notes (Signed)
[facility] EMERGENCY DEPARTMENT Provider Note   CSN: [id number] Arrival date & time: 10/19/18  1102    History   Chief Complaint Chief Complaint  Patient presents with   Chest Pain   Numbness    HPI Vincent [name] is a 53 y.o. male with a past medical history of prior TIA, CAD, COPD, IDDM, bipolar disorder, bladder cancer status post chemotherapy presents to ED for multiple complaints. His first complaint is left-sided chest pain.  Symptoms began at approximately 4:30 AM this morning.  Accompanied by left-sided radiating pain and numbness.  He describes the numbness as "like my arm fell asleep."  He went to work this morning and had to leave early due to worsening numbness on his left side.  He continues to have left-sided chest pressure.  He states that the numbness has spread to the entire left side of his body starting from his head and going down to his legs.  States that he has been compliant with his home insulin and sugars have been anywhere between 300-500.  He cannot recall any inciting event that may have triggered his symptoms.  His last known normal was 11 PM last night before he went to sleep.  States that similar symptoms happened him in the past when he was diagnosed with a TIA.  Denies any shortness of breath from baseline due to his tobacco abuse.  Denies any sick contacts with similar symptoms, vision changes, vomiting, abdominal pain, fever, urinary symptoms, headache.     HPI  Past Medical History:  Diagnosis Date   Anxiety    Bilateral carpal tunnel syndrome    Bipolar 1 disorder (HCC)    CAD (coronary artery disease)    Non obstructive on CTA Oct 2019.    Chronic pain syndrome    back   Cold extremities    BLE   COPD (chronic obstructive pulmonary disease) (HCC)    Cubital tunnel syndrome on right    DDD (degenerative disc disease), lumbar    Diabetic peripheral neuropathy (HCC)    Gastroparesis    GERD (gastroesophageal  reflux disease)    Hiatal hernia    History of bladder cancer urologist-  dr Consuella Lose   papillay TCC (Ta G1)  s/p TURBT and chemo instillation 2014   History of bronchitis    History of carpal tunnel syndrome    Bilateral   History of chest pain 12/2017   History of chest pain 12/2017   heart cath normal   History of encephalopathy 05/27/2015   admission w/ acute encephalopathy thought to be secondary to pain meds and COPD   History of gastric ulcer    History of Helicobacter pylori infection    History of kidney stones    not aware   History of TIA (transient ischemic attack) 2008    no residual   History of traumatic head injury 2010   w/ LOC  per pt needed stitches, hit in head with a mower blade   Hyperlipidemia    Hypertension    Hypothyroidism    Insomnia    per sleep study 04-19-2015 without sleep apnea   Neuropathy in diabetes ([place])    LOWER EXTREMITIES   PTSD (post-traumatic stress disorder)    RA (rheumatoid arthritis) ([place])    Seizures, transient [facility]) neurologist-  dr Krista Blue--  differential dx complex partial seizure .vs.  mood disorder .vs.  pseudoseizure--  negative EEG's   confusion episodes and staring spells since 11/ 2015  Sleep apnea    Mild, NO CPAP ORDERED   Transient confusion NEUOROLOGIST-  DR YAN   Episodes since 11/ 2015--  neurologist dx  differential complex partial seizure  .vs. mood disorder . vs. pseudoseizure   Tremor    Type 2 diabetes mellitus treated with insulin [facility])     Patient Active Problem List   Diagnosis Date Noted   Diabetic autonomic neuropathy associated with type 2 diabetes mellitus ([place]) 02/01/2018   DM type 2 with diabetic peripheral neuropathy ([place]) 02/01/2018   Dyslipidemia 01/31/2018   Coronary artery disease involving native coronary artery of native heart without angina pectoris 01/31/2018   Chest pain 01/01/2018   Seizure disorder ([place]) 12/19/2016   Essential tremor 12/19/2016    Polypharmacy 12/19/2016   Chronic post-traumatic stress disorder (PTSD) 06/16/2016   Tremor 06/16/2016   Tobacco use disorder 05/05/2016   Psychophysiological insomnia 06/02/2015   Altered mental status 05/28/2015   Acute bronchitis 05/28/2015   Type 2 diabetes mellitus with hyperglycemia ([place]) 05/28/2015   Hypothyroidism 05/28/2015   Bipolar 2 disorder ([place]) 05/28/2015   History of rheumatoid arthritis 05/28/2015   Chronic pain 05/28/2015   Depression 03/18/2015   Diabetes ([place]) 01/23/2015   Obesity (BMI 30-39.9) 03/06/2014   Acute encephalopathy 03/05/2014   HLD (hyperlipidemia) 03/05/2014   Anemia, normocytic normochromic 03/05/2014   Altered mental state    Protein-calorie malnutrition, severe ([place]) 10/09/2012   Bladder tumor 08/08/2012   Biliary dyskinesia 11/02/2010    Past Surgical History:  Procedure Laterality Date   CARDIAC CATHETERIZATION  12-27-2001  DR Einar Gip  &  05-26-2009  DR Irish Lack   RESULTS FOR BOTH ARE NORMAL CORONARIES AND PERSERVED LVF/ EF 60%   CARPAL TUNNEL RELEASE Right 09-16-2003   CARPAL TUNNEL RELEASE Left 02/25/2015   Procedure: LEFT CARPAL TUNNEL RELEASE;  Surgeon: Leanora Cover, MD;  Location: [facility];  Service: Orthopedics;  Laterality: Left;   COLONOSCOPY  2014   CYSTOSCOPY N/A 10/10/2012   Procedure: CYSTOSCOPY CLOT EVACUATION FULGERATION OF BLEEDERS ;  Surgeon: Claybon Jabs, MD;  Location: [facility];  Service: Urology;  Laterality: N/A;   CYSTOSCOPY WITH BIOPSY N/A 11/26/2015   Procedure: CYSTOSCOPY WITH BIOPSY AND FULGURATION;  Surgeon: Kathie Rhodes, MD;  Location: [facility];  Service: Urology;  Laterality: N/A;   ESOPHAGOGASTRODUODENOSCOPY  2014   LAPAROSCOPIC CHOLECYSTECTOMY  11-17-2010   NEGATIVE SLEEP STUDY  04-19-2015  in epic   ORCHIECTOMY Right 02/21/2016   Procedure: SCROTAL ORCHIECTOMY with TESTICULAR PROSTHESIS IMPLANT;  Surgeon: Kathie Rhodes, MD;   Location: [facility];  Service: Urology;  Laterality: Right;   ORCHIECTOMY Left 09/02/2018   Procedure: ORCHIECTOMY;  Surgeon: Kathie Rhodes, MD;  Location: [facility];  Service: Urology;  Laterality: Left;   ROTATOR CUFF REPAIR Right 12/2004   TRANSURETHRAL RESECTION OF BLADDER TUMOR N/A 08/09/2012   Procedure: TRANSURETHRAL RESECTION OF BLADDER TUMOR (TURBT) WITH GYRUS WITH MITOMYCIN C;  Surgeon: Claybon Jabs, MD;  Location[facility];  Service: Urology;  Laterality: N/A;   TRANSURETHRAL RESECTION OF BLADDER TUMOR WITH GYRUS (TURBT-GYRUS) N/A 02/27/2014   Procedure: TRANSURETHRAL RESECTION OF BLADDER TUMOR WITH GYRUS (TURBT-GYRUS);  Surgeon: Claybon Jabs, MD;  Location: [facility];  Service: Urology;  Laterality: N/A;        Home Medications    Prior to Admission medications   Medication Sig Start Date End Date Taking? Authorizing Provider  ARIPiprazole (ABILIFY) 5 MG tablet Take  1 tablet (5 mg total) by mouth daily. 09/16/18  Yes Arfeen, Arlyce Harman, MD  atorvastatin (LIPITOR) 40 MG tablet Take 1 tablet (40 mg total) by mouth daily at 6 PM. 05/06/16  Yes Pucilowska, Jolanta B, MD  busPIRone (BUSPAR) 5 MG tablet Take 1 tablet (5 mg total) by mouth 2 (two) times daily. 09/16/18 09/16/19 Yes Arfeen, Arlyce Harman, MD  Cholecalciferol (VITAMIN D3) 2000 units TABS TAKE 1 TABLET BY MOUTH ONCE DAILY Patient taking differently: Take 2,000 Units by mouth daily.  10/10/17  Yes Eksir, Richard Miu, MD  cyclobenzaprine (FLEXERIL) 10 MG tablet Take 1 tablet by mouth 3 times daily as needed for muscle spasm. Warning: May cause drowsiness. 03/16/18  Yes Hagler, Aaron Edelman, MD  Dulaglutide (TRULICITY) 2.13 YQ/6.5HQ SOPN Inject 1 pen into the skin once a week.   Yes [provider]  EQ ASPIRIN ADULT LOW DOSE 81 MG EC tablet Take 81 mg by mouth daily. 01/16/18  Yes [provider]  fenofibrate 160 MG tablet Take 1 tablet (160 mg total) by mouth  daily. 01/03/18 10/19/18 Yes Elodia Florence., MD  fentaNYL (DURAGESIC - DOSED MCG/HR) 75 MCG/HR Place 75 mcg onto the skin every 3 (three) days.    Yes [provider]  gabapentin (NEURONTIN) 600 MG tablet Take 600 mg by mouth 2 (two) times daily. 08/20/18  Yes [provider]  hydroxychloroquine (PLAQUENIL) 200 MG tablet Take 200 mg by mouth 2 (two) times daily.    Yes [provider]  Insulin Glargine (BASAGLAR KWIKPEN) 100 UNIT/ML SOPN Inject 70 Units into the skin 2 (two) times daily.  08/05/17  Yes [provider]  insulin lispro (HUMALOG) 100 UNIT/ML injection Inject 40 Units into the skin 3 (three) times daily before meals.    Yes [provider]  lamoTRIgine (LAMICTAL) 150 MG tablet Take 2 tablets (300 mg total) by mouth every morning. 09/16/18  Yes Arfeen, Arlyce Harman, MD  levothyroxine (SYNTHROID, LEVOTHROID) 25 MCG tablet Take 25 mcg by mouth daily before breakfast.    Yes [provider]  metFORMIN (GLUCOPHAGE-XR) 500 MG 24 hr tablet Take 500 mg by mouth 2 (two) times daily. 08/24/18  Yes [provider]  mirtazapine ([name]) 30 MG tablet Take 1 tablet (30 mg total) by mouth at bedtime. 09/16/18  Yes Arfeen, Arlyce Harman, MD  omeprazole (PRILOSEC) 20 MG capsule Take 20 mg by mouth every morning.    Yes [provider]  pregabalin (LYRICA) 150 MG capsule Take 150 mg by mouth 3 (three) times daily. 09/13/18  Yes [provider]  Prenatal Vit-Fe Fumarate-FA (PREPLUS) 27-1 MG TABS Take 1 tablet by mouth daily. 01/12/18  Yes [provider]  testosterone cypionate (DEPOTESTOSTERONE CYPIONATE) 200 MG/ML injection Inject 1 mL into the muscle every 14 (fourteen) days. 09/10/18  Yes [provider]  venlafaxine XR (EFFEXOR-XR) 150 MG 24 hr capsule Take 2 capsules (300 mg total) by mouth daily with breakfast. 09/16/18  Yes Arfeen, Arlyce Harman, MD  isosorbide mononitrate (IMDUR) 120 MG 24 hr tablet Take 1 tablet (120 mg total)  by mouth daily. Patient taking differently: Take 120 mg by mouth every morning.  01/31/18 03/02/18  Minus Breeding, MD  metoprolol tartrate (LOPRESSOR) 25 MG tablet Take 1 tablet (25 mg total) by mouth 2 (two) times daily. 01/03/18 02/02/18  Elodia Florence., MD  mupirocin ointment (BACTROBAN) 2 % Apply 1 application topically 2 (two) times daily. Apply to the affected area 2 times a day Patient  not taking: Reported on 10/19/2018 01/24/18   Newt Minion, MD  ondansetron (ZOFRAN-ODT) 4 MG disintegrating tablet Take 1 tablet (4 mg total) by mouth every 8 (eight) hours as needed for nausea or vomiting. Patient not taking: Reported on 05/29/2018 07/03/17   Vanessa Kick, MD  Oxycodone HCl 10 MG TABS Take 1 tablet (10 mg total) by mouth every 4 (four) hours as needed. Patient not taking: Reported on 10/19/2018 09/02/18   Kathie Rhodes, MD    Family History Family History  Problem Relation Age of Onset   Diabetes Mother    Diabetes Father    Hypertension Father    Heart attack Father 23       died age 57   Alcohol abuse Father     Social History Social History   Tobacco Use   Smoking status: Current Every Day Smoker    Packs/day: 2.00    Years: 38.00    Pack years: 76.00    Types: Cigarettes   Smokeless tobacco: Never Used   Tobacco comment: Has cut back to 1 pack a day  Substance Use Topics   Alcohol use: No   Drug use: No     Allergies   Celebrex [celecoxib], Hydrocodone, and Sulfa antibiotics   Review of Systems Review of Systems  Constitutional: Negative for appetite change, chills and fever.  HENT: Negative for ear pain, rhinorrhea, sneezing and sore throat.   Eyes: Negative for photophobia and visual disturbance.  Respiratory: Negative for cough, chest tightness, shortness of breath and wheezing.   Cardiovascular: Positive for chest pain. Negative for palpitations.  Gastrointestinal: Negative for abdominal pain, blood in stool, constipation, diarrhea,  nausea and vomiting.  Genitourinary: Negative for dysuria, hematuria and urgency.  Musculoskeletal: Negative for myalgias.  Skin: Negative for rash.  Neurological: Positive for numbness. Negative for dizziness, weakness and light-headedness.     Physical Exam Updated Vital Signs BP (!) 145/83    Pulse 89    Temp 98.3 F (36.8 C) (Oral)    Resp 17    Ht 6' (1.829 m)    Wt 106.1 kg    SpO2 98%    BMI 31.74 kg/m   Physical Exam Vitals signs and nursing note reviewed.  Constitutional:      General: He is not in acute distress.    Appearance: He is well-developed.  HENT:     Head: Normocephalic and atraumatic.     Nose: Nose normal.  Eyes:     General: No scleral icterus.       Left eye: No discharge.     Conjunctiva/sclera: Conjunctivae normal.  Neck:     Musculoskeletal: Normal range of motion and neck supple.  Cardiovascular:     Rate and Rhythm: Normal rate and regular rhythm.     Heart sounds: Normal heart sounds. No murmur. No friction rub. No gallop.   Pulmonary:     Effort: Pulmonary effort is normal. No respiratory distress.     Breath sounds: Normal breath sounds.  Abdominal:     General: Bowel sounds are normal. There is no distension.     Palpations: Abdomen is soft.     Tenderness: There is no abdominal tenderness. There is no guarding.  Musculoskeletal: Normal range of motion.     Comments: No lower extremity edema, erythema or calf tenderness bilaterally.  Skin:    General: Skin is warm and dry.     Findings: No rash.  Neurological:     Mental Status: He  is alert and oriented to person, place, and time.     Motor: Weakness (Left upper extremity, left lower extremity) present. No abnormal muscle tone.     Coordination: Coordination normal.     Comments: Reports change sensation to light touch between left and right side.  No facial asymmetry noted. PERRL. AAOx3.       ED Treatments / Results  Labs (all labs ordered are listed, but only abnormal results  are displayed) Labs Reviewed  URINALYSIS, ROUTINE W REFLEX MICROSCOPIC - Abnormal; Notable for the following components:      Result Value   Color, Urine STRAW (*)    Specific Gravity, Urine 1.032 (*)    Glucose, UA >=500 (*)    All other components within normal limits  COMPREHENSIVE METABOLIC PANEL - Abnormal; Notable for the following components:   Sodium 128 (*)    Chloride 94 (*)    CO2 19 (*)    Glucose, Bld 679 (*)    Creatinine, Ser 1.29 (*)    All other components within normal limits  CBC WITH DIFFERENTIAL/PLATELET - Abnormal; Notable for the following components:   Hemoglobin 17.1 (*)    All other components within normal limits  CBG MONITORING, ED - Abnormal; Notable for the following components:   Glucose-Capillary >600 (*)    All other components within normal limits  I-STAT CHEM 8, ED - Abnormal; Notable for the following components:   Sodium 128 (*)    Glucose, Bld 668 (*)    Calcium, Ion 1.11 (*)    TCO2 20 (*)    All other components within normal limits  SARS CORONAVIRUS 2 (HOSPITAL ORDER, [place] [facility] [facility])  ETHANOL  PROTIME-INR  APTT  RAPID URINE DRUG SCREEN, HOSP PERFORMED  CBC WITH DIFFERENTIAL/PLATELET  BASIC METABOLIC PANEL  BASIC METABOLIC PANEL  BASIC METABOLIC PANEL  BASIC METABOLIC PANEL  TROPONIN I (HIGH SENSITIVITY)  TROPONIN I (HIGH SENSITIVITY)    EKG EKG Interpretation  Date/Time:  Saturday October 19 2018 12:02:01 EDT Ventricular Rate:  90 PR Interval:    QRS Duration: 104 QT Interval:  337 QTC Calculation: 413 R Axis:   59 Text Interpretation:  Sinus rhythm Ventricular premature complex Nonspecific T abnormalities, lateral leads ST elev, probable normal early repol pattern No STEMI  Confirmed by Madalyn Rob [phone]) on 10/19/2018 2:10:59 PM   Radiology Ct Angio Head W Or Wo Contrast  Result Date: 10/19/2018 CLINICAL DATA:  TIA, left-sided numbness EXAM: CT ANGIOGRAPHY HEAD AND NECK TECHNIQUE:  Multidetector CT imaging of the head and neck was performed using the standard protocol during bolus administration of intravenous contrast. Multiplanar CT image reconstructions and MIPs were obtained to evaluate the vascular anatomy. Carotid stenosis measurements (when applicable) are obtained utilizing NASCET criteria, using the distal internal carotid diameter as the denominator. CONTRAST:  1110mL OMNIPAQUE IOHEXOL 350 MG/ML SOLN COMPARISON:  CT head 05/16/2017 FINDINGS: CT HEAD FINDINGS Brain: No evidence of acute infarction, hemorrhage, hydrocephalus, extra-axial collection or mass lesion/mass effect. Vascular: Negative for hyperdense vessel Skull: Negative Sinuses: Negative Orbits: Negative Review of the MIP images confirms the above findings CTA NECK FINDINGS Aortic arch: Standard branching. Imaged portion shows no evidence of aneurysm or dissection. No significant stenosis of the major arch vessel origins. Right carotid system: Mild atherosclerotic disease right common carotid artery. Atherosclerotic calcification proximal right internal carotid artery narrowing the lumen to 50% diameter stenosis. Left carotid system: Left common carotid artery widely patent with mild atherosclerotic disease. Mild atherosclerotic  calcification proximal left internal carotid artery without significant stenosis. Vertebral arteries: Both vertebral arteries are widely patent without stenosis. Right vertebral artery dominant. Skeleton: No acute abnormality. Other neck: Negative for mass or adenopathy. Upper chest: Lung apices clear bilaterally. Review of the MIP images confirms the above findings CTA HEAD FINDINGS Anterior circulation: Atherosclerotic disease in the cavernous carotid bilaterally with mild narrowing bilaterally. Anterior and middle cerebral arteries are patent bilaterally with mild irregularity. Intracranial circulation is diffusely of small caliber. Some of this is due to atherosclerotic disease and some is  congenital. Posterior circulation: Both vertebral arteries patent to the basilar. PICA patent bilaterally. Basilar is small but patent. Superior cerebellar arteries patent bilaterally. Posterior cerebral arteries are small and irregular but without critical stenosis. Venous sinuses: Normal enhancement Anatomic variants: None Review of the MIP images confirms the above findings IMPRESSION: 1. 50% diameter stenosis proximal right internal carotid artery. Left carotid with mild atherosclerotic disease but no significant stenosis. Both vertebral arteries patent in the neck. 2. Small intracranial circulation diffusely due to small vessels and superimposed atherosclerotic disease. Mild irregularity in the anterior and posterior circulation. No critical stenosis or large vessel occlusion. 3. No acute intracranial abnormality. Electronically Signed   By: Franchot Gallo M.D.   On: 10/19/2018 12:51   Ct Angio Neck W And/or Wo Contrast  Result Date: 10/19/2018 CLINICAL DATA:  TIA, left-sided numbness EXAM: CT ANGIOGRAPHY HEAD AND NECK TECHNIQUE: Multidetector CT imaging of the head and neck was performed using the standard protocol during bolus administration of intravenous contrast. Multiplanar CT image reconstructions and MIPs were obtained to evaluate the vascular anatomy. Carotid stenosis measurements (when applicable) are obtained utilizing NASCET criteria, using the distal internal carotid diameter as the denominator. CONTRAST:  163mL OMNIPAQUE IOHEXOL 350 MG/ML SOLN COMPARISON:  CT head 05/16/2017 FINDINGS: CT HEAD FINDINGS Brain: No evidence of acute infarction, hemorrhage, hydrocephalus, extra-axial collection or mass lesion/mass effect. Vascular: Negative for hyperdense vessel Skull: Negative Sinuses: Negative Orbits: Negative Review of the MIP images confirms the above findings CTA NECK FINDINGS Aortic arch: Standard branching. Imaged portion shows no evidence of aneurysm or dissection. No significant stenosis of  the major arch vessel origins. Right carotid system: Mild atherosclerotic disease right common carotid artery. Atherosclerotic calcification proximal right internal carotid artery narrowing the lumen to 50% diameter stenosis. Left carotid system: Left common carotid artery widely patent with mild atherosclerotic disease. Mild atherosclerotic calcification proximal left internal carotid artery without significant stenosis. Vertebral arteries: Both vertebral arteries are widely patent without stenosis. Right vertebral artery dominant. Skeleton: No acute abnormality. Other neck: Negative for mass or adenopathy. Upper chest: Lung apices clear bilaterally. Review of the MIP images confirms the above findings CTA HEAD FINDINGS Anterior circulation: Atherosclerotic disease in the cavernous carotid bilaterally with mild narrowing bilaterally. Anterior and middle cerebral arteries are patent bilaterally with mild irregularity. Intracranial circulation is diffusely of small caliber. Some of this is due to atherosclerotic disease and some is congenital. Posterior circulation: Both vertebral arteries patent to the basilar. PICA patent bilaterally. Basilar is small but patent. Superior cerebellar arteries patent bilaterally. Posterior cerebral arteries are small and irregular but without critical stenosis. Venous sinuses: Normal enhancement Anatomic variants: None Review of the MIP images confirms the above findings IMPRESSION: 1. 50% diameter stenosis proximal right internal carotid artery. Left carotid with mild atherosclerotic disease but no significant stenosis. Both vertebral arteries patent in the neck. 2. Small intracranial circulation diffusely due to small vessels and superimposed atherosclerotic disease. Mild irregularity in  the anterior and posterior circulation. No critical stenosis or large vessel occlusion. 3. No acute intracranial abnormality. Electronically Signed   By: Franchot Gallo M.D.   On: 10/19/2018 12:51     Dg Chest Portable 1 View  Result Date: 10/19/2018 CLINICAL DATA:  Acute chest pain today. EXAM: PORTABLE CHEST 1 VIEW COMPARISON:  01/01/2018 and prior chest radiographs FINDINGS: The cardiomediastinal silhouette is unremarkable. There is no evidence of focal airspace disease, pulmonary edema, suspicious pulmonary nodule/mass, pleural effusion, or pneumothorax. No acute bony abnormalities are identified. IMPRESSION: No active disease. Electronically Signed   By: Margarette Canada M.D.   On: 10/19/2018 11:51   Ct Angio Chest Aorta W/cm &/or Wo/cm  Result Date: 10/19/2018 CLINICAL DATA:  Patient woke up with left-sided arm weakness that is progressively worsened. Left-sided chest tightness and discomfort. EXAM: CT ANGIOGRAPHY CHEST WITH CONTRAST TECHNIQUE: Multidetector CT imaging of the chest was performed using the standard protocol during bolus administration of intravenous contrast. Multiplanar CT image reconstructions and MIPs were obtained to evaluate the vascular anatomy. CONTRAST:  168mL OMNIPAQUE IOHEXOL 350 MG/ML SOLN COMPARISON:  Chest x-ray October 19, 2018. CT of the chest October 13, 2010. FINDINGS: Cardiovascular: Atherosclerosis is seen in the right coronary artery. The heart is normal in appearance. The central pulmonary arteries are normal. Mild atherosclerotic changes are seen in the thoracic aorta. No aneurysm is identified. No dissection is identified in the thoracic aorta. Mild atherosclerotic changes are seen at the origins of the great vessels off of the aortic arch. There is no significant narrowing of any of these vessels. The most significant atherosclerosis is at the origin of the left carotid artery as seen on sagittal image 104 but the origin remains widely patent. The carotid arteries and subclavian arteries are otherwise normal. The vertebral arteries are patent. Mediastinum/Nodes: No enlarged mediastinal, hilar, or axillary lymph nodes. Thyroid gland, trachea, and esophagus demonstrate  no significant findings. Lungs/Pleura: Lungs are clear. No pleural effusion or pneumothorax. Upper Abdomen: Patient is status post cholecystectomy. Low-attenuation in the posterior aspect of both kidneys is likely due to streak artifact which limits evaluation. No other abnormalities are seen in the upper abdomen. Musculoskeletal: No chest wall abnormality. No acute or significant osseous findings. Review of the MIP images confirms the above findings. IMPRESSION: 1. No aneurysm or dissection in the thoracic aorta or branching vessels. Atherosclerotic changes are seen in the aorta and at the origin of the branching vessels without significant stenosis. 2. Coronary artery calcifications on the right. Aortic Atherosclerosis (ICD10-I70.0). Electronically Signed   By: Dorise Bullion III M.D   On: 10/19/2018 12:45    Procedures .Critical Care Performed by: Delia Heady, PA-C Authorized by: Delia Heady, PA-C   Critical care provider statement:    Critical care time (minutes):  35   Critical care time was exclusive of:  Separately billable procedures and treating other patients   Critical care was necessary to treat or prevent imminent or life-threatening deterioration of the following conditions:  Endocrine crisis, shock, CNS failure or compromise and cardiac failure   Critical care was time spent personally by me on the following activities:  Development of treatment plan with patient or surrogate, discussions with consultants, evaluation of patient's response to treatment, obtaining history from patient or surrogate, ordering and performing treatments and interventions, ordering and review of laboratory studies, ordering and review of radiographic studies, re-evaluation of patient's condition and review of old charts   I assumed direction of critical care for this patient  from another provider in my specialty: no     (including critical care time)  Medications Ordered in ED Medications  sodium  chloride 0.9 % bolus 1,000 mL (has no administration in time range)  sodium chloride 0.9 % bolus 1,000 mL (has no administration in time range)  clopidogrel (PLAVIX) tablet 75 mg (has no administration in time range)  dextrose 5 %-0.45 % sodium chloride infusion (has no administration in time range)  insulin regular bolus via infusion 0-10 Units (has no administration in time range)  insulin regular, human (MYXREDLIN) 100 units/ 100 mL infusion (has no administration in time range)  dextrose 50 % solution 25 mL (has no administration in time range)  0.9 %  sodium chloride infusion (has no administration in time range)  iohexol (OMNIPAQUE) 350 MG/ML injection 100 mL (100 mLs Intravenous Contrast Given 10/19/18 1137)  clopidogrel (PLAVIX) tablet 300 mg (300 mg Oral Given 10/19/18 1400)  aspirin EC tablet 325 mg (325 mg Oral Given 10/19/18 1401)  insulin aspart (novoLOG) injection 5 Units (5 Units Subcutaneous Given 10/19/18 1406)     Initial Impression / Assessment and Plan / ED Course  I have reviewed the triage vital signs and the nursing notes.  Pertinent labs & imaging results that were available during my care of the patient were reviewed by me and considered in my medical decision making (see chart for details).  Clinical Course as of Oct 18 1429  Sat Oct 19, 2018  1108 Glucose-Capillary(!!): >600 [HK]  1150 Glucose(!!): 679 [HK]  1150 Sodium(!): 128 [HK]  1254 Moderate CAD in RCA seen on CT Oct 2019.   [HK]  [place] to Dr. Marlou Porch of cardiology.  States that patient had a CT scan FFR protocol done October 2019 which showed no.  States that from a cardiology standpoint, he is cleared for 5 years since that study.  No further recommendations from cardiology at this time.   [HK]  1400 Troponin I (High Sensitivity): 5 [HK]  1431 Ketones, ur: NEGATIVE [HK]    Clinical Course User Index [HK] Delia Heady, PA-C       53 year old male with a past medical history of CAD, prior TIA,  tremors, IDDM, presents to ED for left-sided chest pain/pressure, left-sided body numbness.  Last known normal was approximately 12 hours ago.  He woke up at 4 AM, approximately 7 hours ago with symptoms.  He went to work but had to leave early due to worsening symptoms.  He continues to have chest pressure and now numbness has radiated to his entire left side of his body while it began in his left arm.  He reports history of similar symptoms in the past and diagnosed with TIA. Felt like he was going to pass out while he was driving to ED so he pulled over on the side of the road and called 911. On arrival here, L sided weakness in UE, LE but no facial asymmetry noted. AAOx3. CBG >600 although he reports compliance with insulin. Initial code stroke activation was cancelled after speaking to [facility], neurology. VAN negative. Will proceed with labwork and CT angio of head, neck and chest to evaluate for stroke and dissection as the cause of his chest discomfort and neuro symptoms.   CT Angie of the head, neck and chest are negative for acute abnormality.  Lab work significant for blood glucose level of 668, sodium of 128, glucosuria.  COVID swab is pending.  Patient cleared from cardiology standpoint per Dr. Marlou Porch.  Please see Dr. Cecil Cobbs consult note for further detail recommendations.  Patient will need to be admitted for stroke risk factor work-up as well as hyperglycemia. Appreciate help of consults and hospitalist on management of this patient.   Final Clinical Impressions(s) / ED Diagnoses   Final diagnoses:  Hyperglycemia  Paresthesia    ED Discharge Orders    None       Delia Heady, PA-C 10/19/18 1431    Lucrezia Starch, MD 10/21/18 1325

## 2018-10-19 NOTE — ED Notes (Addendum)
Unsuccessful blood draw attempt informed RN Lattie Haw

## 2018-10-19 NOTE — H&P (Signed)
History and Physical  Vincent [name] NGE:[id number] DOB: 1965-04-30 DOA: 10/19/2018  Referring physician: Delia Heady, PA-C, ED provider PCP: Hayden Rasmussen, MD  Outpatient Specialists:   Patient Coming From: home  Chief Complaint: Left sided weakness, chest heaviness  HPI: Vincent [name] is a 53 y.o. male with a history of uncontrolled type 2 diabetes, coronary artery disease (nonobstructive on CTA in October 2019), bipolar disorder, rheumatoid arthritis on chronic opioid management, COPD, GERD, history of bladder cancer, hypertension, hypothyroidism, seizure disorder.  Patient seen for onset of left-sided weakness with chest heaviness that started earlier today.  Symptoms started early this morning and worsened as the day progressed.  He came to the hospital to be evaluated.  No palliating or provoking factors.  His chest heaviness is improving.  His blood sugar has been high for the past couple of days and he states that he has been taking his insulin as directed.  Emergency Department Course: CTA of the head and neck shows and proximal right internal carotid artery stenosis.  CT Angie of the chest shows no remarkable disease.  Blood sugar was greater than 600 on arrival with a sodium of 128 bicarb of 19 and anion gap of 15.  High-sensitivity troponin was 4 initially with a repeat of 5.  Neurology consult on the patient recommended TIA/stroke work-up.  Review of Systems:   Pt denies any fevers, chills, nausea, vomiting, diarrhea, constipation, abdominal pain, shortness of breath, dyspnea on exertion, orthopnea, cough, wheezing, palpitations, headache, vision changes, lightheadedness, dizziness, melena, rectal bleeding.  Review of systems are otherwise negative  Past Medical History:  Diagnosis Date   Anxiety    Bilateral carpal tunnel syndrome    Bipolar 1 disorder (HCC)    CAD (coronary artery disease)    Non obstructive on CTA Oct 2019.    Chronic pain syndrome    back    Cold extremities    BLE   COPD (chronic obstructive pulmonary disease) (HCC)    Cubital tunnel syndrome on right    DDD (degenerative disc disease), lumbar    Diabetic peripheral neuropathy (HCC)    Gastroparesis    GERD (gastroesophageal reflux disease)    Hiatal hernia    History of bladder cancer urologist-  dr Consuella Lose   papillay TCC (Ta G1)  s/p TURBT and chemo instillation 2014   History of bronchitis    History of carpal tunnel syndrome    Bilateral   History of chest pain 12/2017   History of chest pain 12/2017   heart cath normal   History of encephalopathy 05/27/2015   admission w/ acute encephalopathy thought to be secondary to pain meds and COPD   History of gastric ulcer    History of Helicobacter pylori infection    History of kidney stones    not aware   History of TIA (transient ischemic attack) 2008    no residual   History of traumatic head injury 2010   w/ LOC  per pt needed stitches, hit in head with a mower blade   Hyperlipidemia    Hypertension    Hypothyroidism    Insomnia    per sleep study 04-19-2015 without sleep apnea   Neuropathy in diabetes ([place])    LOWER EXTREMITIES   PTSD (post-traumatic stress disorder)    RA (rheumatoid arthritis) ([place])    Seizures, transient [facility]) neurologist-  dr Krista Blue--  differential dx complex partial seizure .vs.  mood disorder .vs.  pseudoseizure--  negative EEG's  confusion episodes and staring spells since 11/ 2015   Sleep apnea    Mild, NO CPAP ORDERED   Transient confusion NEUOROLOGIST-  DR YAN   Episodes since 11/ 2015--  neurologist dx  differential complex partial seizure  .vs. mood disorder . vs. pseudoseizure   Tremor    Type 2 diabetes mellitus treated with insulin [facility])    Past Surgical History:  Procedure Laterality Date   CARDIAC CATHETERIZATION  12-27-2001  DR Einar Gip  &  05-26-2009  DR Irish Lack   RESULTS FOR BOTH ARE NORMAL CORONARIES AND PERSERVED LVF/ EF 60%   CARPAL  TUNNEL RELEASE Right 09-16-2003   CARPAL TUNNEL RELEASE Left 02/25/2015   Procedure: LEFT CARPAL TUNNEL RELEASE;  Surgeon: Leanora Cover, MD;  Location: [place];  Service: Orthopedics;  Laterality: Left;   COLONOSCOPY  2014   CYSTOSCOPY N/A 10/10/2012   Procedure: CYSTOSCOPY CLOT EVACUATION FULGERATION OF BLEEDERS ;  Surgeon: Claybon Jabs, MD;  Location[facility];  Service: Urology;  Laterality: N/A;   CYSTOSCOPY WITH BIOPSY N/A 11/26/2015   Procedure: CYSTOSCOPY WITH BIOPSY AND FULGURATION;  Surgeon: Kathie Rhodes, MD;  Location: [place];  Service: Urology;  Laterality: N/A;   ESOPHAGOGASTRODUODENOSCOPY  2014   LAPAROSCOPIC CHOLECYSTECTOMY  11-17-2010   NEGATIVE SLEEP STUDY  04-19-2015  in epic   ORCHIECTOMY Right 02/21/2016   Procedure: SCROTAL ORCHIECTOMY with TESTICULAR PROSTHESIS IMPLANT;  Surgeon: Kathie Rhodes, MD;  Location: [facility];  Service: Urology;  Laterality: Right;   ORCHIECTOMY Left 09/02/2018   Procedure: ORCHIECTOMY;  Surgeon: Kathie Rhodes, MD;  Location: [facility];  Service: Urology;  Laterality: Left;   ROTATOR CUFF REPAIR Right 12/2004   TRANSURETHRAL RESECTION OF BLADDER TUMOR N/A 08/09/2012   Procedure: TRANSURETHRAL RESECTION OF BLADDER TUMOR (TURBT) WITH GYRUS WITH MITOMYCIN C;  Surgeon: Claybon Jabs, MD;  Location: [facility];  Service: Urology;  Laterality: N/A;   TRANSURETHRAL RESECTION OF BLADDER TUMOR WITH GYRUS (TURBT-GYRUS) N/A 02/27/2014   Procedure: TRANSURETHRAL RESECTION OF BLADDER TUMOR WITH GYRUS (TURBT-GYRUS);  Surgeon: Claybon Jabs, MD;  Location: [facility];  Service: Urology;  Laterality: N/A;   Social History:  reports that he has been smoking cigarettes. He has a 76.00 pack-year smoking history. He has never used smokeless tobacco. He reports that he does not drink alcohol or use drugs. Patient lives at  home  Allergies  Allergen Reactions   Celebrex [Celecoxib] Anaphylaxis and Rash   Hydrocodone Rash and Other (See Comments)    "blisters developed on arms"   Sulfa Antibiotics Rash    Family History  Problem Relation Age of Onset   Diabetes Mother    Diabetes Father    Hypertension Father    Heart attack Father 80       died age 64   Alcohol abuse Father       Prior to Admission medications   Medication Sig Start Date End Date Taking? Authorizing Provider  ARIPiprazole (ABILIFY) 5 MG tablet Take 1 tablet (5 mg total) by mouth daily. 09/16/18  Yes Arfeen, Arlyce Harman, MD  atorvastatin (LIPITOR) 40 MG tablet Take 1 tablet (40 mg total) by mouth daily at 6 PM. 05/06/16  Yes Pucilowska, Jolanta B, MD  busPIRone (BUSPAR) 5 MG tablet Take 1 tablet (5 mg total) by mouth 2 (two) times daily. 09/16/18 09/16/19 Yes Arfeen, Arlyce Harman, MD  Cholecalciferol (VITAMIN D3) 2000 units TABS TAKE 1 TABLET BY  MOUTH ONCE DAILY Patient taking differently: Take 2,000 Units by mouth daily.  10/10/17  Yes Eksir, Richard Miu, MD  cyclobenzaprine (FLEXERIL) 10 MG tablet Take 1 tablet by mouth 3 times daily as needed for muscle spasm. Warning: May cause drowsiness. 03/16/18  Yes Hagler, Aaron Edelman, MD  Dulaglutide (TRULICITY) 6.22 WL/7.9GX SOPN Inject 1 pen into the skin once a week.   Yes [provider]  EQ ASPIRIN ADULT LOW DOSE 81 MG EC tablet Take 81 mg by mouth daily. 01/16/18  Yes [provider]  fenofibrate 160 MG tablet Take 1 tablet (160 mg total) by mouth daily. 01/03/18 10/19/18 Yes Elodia Florence., MD  fentaNYL (DURAGESIC - DOSED MCG/HR) 75 MCG/HR Place 75 mcg onto the skin every 3 (three) days.    Yes [provider]  gabapentin (NEURONTIN) 600 MG tablet Take 600 mg by mouth 2 (two) times daily. 08/20/18  Yes [provider]  hydroxychloroquine (PLAQUENIL) 200 MG tablet Take 200 mg by mouth 2 (two) times daily.    Yes [provider]  Insulin Glargine (BASAGLAR  KWIKPEN) 100 UNIT/ML SOPN Inject 70 Units into the skin 2 (two) times daily.  08/05/17  Yes [provider]  insulin lispro (HUMALOG) 100 UNIT/ML injection Inject 40 Units into the skin 3 (three) times daily before meals.    Yes [provider]  lamoTRIgine (LAMICTAL) 150 MG tablet Take 2 tablets (300 mg total) by mouth every morning. 09/16/18  Yes Arfeen, Arlyce Harman, MD  levothyroxine (SYNTHROID, LEVOTHROID) 25 MCG tablet Take 25 mcg by mouth daily before breakfast.    Yes [provider]  metFORMIN (GLUCOPHAGE-XR) 500 MG 24 hr tablet Take 500 mg by mouth 2 (two) times daily. 08/24/18  Yes [provider]  mirtazapine ([name]) 30 MG tablet Take 1 tablet (30 mg total) by mouth at bedtime. 09/16/18  Yes Arfeen, Arlyce Harman, MD  omeprazole (PRILOSEC) 20 MG capsule Take 20 mg by mouth every morning.    Yes [provider]  pregabalin (LYRICA) 150 MG capsule Take 150 mg by mouth 3 (three) times daily. 09/13/18  Yes [provider]  Prenatal Vit-Fe Fumarate-FA (PREPLUS) 27-1 MG TABS Take 1 tablet by mouth daily. 01/12/18  Yes [provider]  testosterone cypionate (DEPOTESTOSTERONE CYPIONATE) 200 MG/ML injection Inject 1 mL into the muscle every 14 (fourteen) days. 09/10/18  Yes [provider]  venlafaxine XR (EFFEXOR-XR) 150 MG 24 hr capsule Take 2 capsules (300 mg total) by mouth daily with breakfast. 09/16/18  Yes Arfeen, Arlyce Harman, MD  isosorbide mononitrate (IMDUR) 120 MG 24 hr tablet Take 1 tablet (120 mg total) by mouth daily. Patient taking differently: Take 120 mg by mouth every morning.  01/31/18 03/02/18  Minus Breeding, MD  metoprolol tartrate (LOPRESSOR) 25 MG tablet Take 1 tablet (25 mg total) by mouth 2 (two) times daily. 01/03/18 02/02/18  Elodia Florence., MD  mupirocin ointment (BACTROBAN) 2 % Apply 1 application topically 2 (two) times daily. Apply to the affected area 2 times a day Patient not taking: Reported on 10/19/2018  01/24/18   Newt Minion, MD  ondansetron (ZOFRAN-ODT) 4 MG disintegrating tablet Take 1 tablet (4 mg total) by mouth every 8 (eight) hours as needed for nausea or vomiting. Patient not taking: Reported on 05/29/2018 07/03/17   Vanessa Kick, MD  Oxycodone HCl 10 MG TABS Take 1 tablet (10 mg total) by mouth every 4 (four) hours as needed. Patient not taking: Reported on 10/19/2018 09/02/18  Kathie Rhodes, MD    Physical Exam: BP (!) 145/83    Pulse 89    Temp 98.3 F (36.8 C) (Oral)    Resp 17    Ht 6' (1.829 m)    Wt 106.1 kg    SpO2 98%    BMI 31.74 kg/m    General: Middle-aged Caucasian male. Awake and alert and oriented x3. No acute cardiopulmonary distress.   HEENT: Normocephalic atraumatic.  Right and left ears normal in appearance.  Pupils equal, round, reactive to light. Extraocular muscles are intact. Sclerae anicteric and noninjected.  Moist mucosal membranes. No mucosal lesions.   Neck: Neck supple without lymphadenopathy. No carotid bruits. No masses palpated.   Cardiovascular: Regular rate with normal S1-S2 sounds. No murmurs, rubs, gallops auscultated. No JVD.   Respiratory: Good respiratory effort with no wheezes, rales, rhonchi. Lungs clear to auscultation bilaterally.  No accessory muscle use.  Abdomen: Soft, nontender, nondistended. Active bowel sounds. No masses or hepatosplenomegaly   Skin: No rashes, lesions, or ulcerations.  Dry, warm to touch. 2+ dorsalis pedis and radial pulses.  Musculoskeletal: No calf or leg pain. All major joints not erythematous nontender.  No upper or lower joint deformation.  Good ROM.  No contractures   Psychiatric: Intact judgment and insight. Pleasant and cooperative.  Neurologic: Patient exhibits numbness on the left side of the body worse in the extremities and on the face.  Patient has slight tremor but no other motor deficits.  A full neurologic exam was done by neurology, which I did not repeat - please see their note for details             Labs on Admission: I have personally reviewed following labs and imaging studies  CBC: Recent Labs  Lab 10/19/18 1135 10/19/18 1202  WBC  --  8.8  NEUTROABS  --  5.9  HGB 17.0 17.1*  HCT 50.0 48.6  MCV  --  84.1  PLT  --  381   Basic Metabolic Panel: Recent Labs  Lab 10/19/18 1127 10/19/18 1135  NA 128* 128*  K 4.8 5.0  CL 94* 98  CO2 19*  --   GLUCOSE 679* 668*  BUN 15 18  CREATININE 1.29* 1.10  CALCIUM 9.5  --    GFR: Estimated Creatinine Clearance: 100 mL/min (by C-G formula based on SCr of 1.1 mg/dL). Liver Function Tests: Recent Labs  Lab 10/19/18 1127  AST 16  ALT 19  ALKPHOS 65  BILITOT 0.9  PROT 6.8  ALBUMIN 4.2   No results for input(s): LIPASE, AMYLASE in the last 168 hours. No results for input(s): AMMONIA in the last 168 hours. Coagulation Profile: Recent Labs  Lab 10/19/18 1127  INR 1.0   Cardiac Enzymes: No results for input(s): CKTOTAL, CKMB, CKMBINDEX, TROPONINI in the last 168 hours. BNP (last 3 results) No results for input(s): PROBNP in the last 8760 hours. HbA1C: No results for input(s): HGBA1C in the last 72 hours. CBG: Recent Labs  Lab 10/19/18 1108 10/19/18 1445  GLUCAP >600* 449*   Lipid Profile: No results for input(s): CHOL, HDL, LDLCALC, TRIG, CHOLHDL, LDLDIRECT in the last 72 hours. Thyroid Function Tests: No results for input(s): TSH, T4TOTAL, FREET4, T3FREE, THYROIDAB in the last 72 hours. Anemia Panel: No results for input(s): VITAMINB12, FOLATE, FERRITIN, TIBC, IRON, RETICCTPCT in the last 72 hours. Urine analysis:    Component Value Date/Time   COLORURINE STRAW (A) 10/19/2018 1300   APPEARANCEUR CLEAR 10/19/2018 1300   LABSPEC 1.032 (  H) 10/19/2018 1300   PHURINE 6.0 10/19/2018 1300   GLUCOSEU >=500 (A) 10/19/2018 1300   HGBUR NEGATIVE 10/19/2018 1300   BILIRUBINUR NEGATIVE 10/19/2018 1300   KETONESUR NEGATIVE 10/19/2018 1300   PROTEINUR NEGATIVE 10/19/2018 1300   UROBILINOGEN 1.0 03/04/2014  2312   NITRITE NEGATIVE 10/19/2018 1300   LEUKOCYTESUR NEGATIVE 10/19/2018 1300   Sepsis Labs: @LABRCNTIP (procalcitonin:4,lacticidven:4) )No results found for this or any previous visit (from the past 240 hour(s)).   Radiological Exams on Admission: Ct Angio Head W Or Wo Contrast  Result Date: 10/19/2018 CLINICAL DATA:  TIA, left-sided numbness EXAM: CT ANGIOGRAPHY HEAD AND NECK TECHNIQUE: Multidetector CT imaging of the head and neck was performed using the standard protocol during bolus administration of intravenous contrast. Multiplanar CT image reconstructions and MIPs were obtained to evaluate the vascular anatomy. Carotid stenosis measurements (when applicable) are obtained utilizing NASCET criteria, using the distal internal carotid diameter as the denominator. CONTRAST:  153mL OMNIPAQUE IOHEXOL 350 MG/ML SOLN COMPARISON:  CT head 05/16/2017 FINDINGS: CT HEAD FINDINGS Brain: No evidence of acute infarction, hemorrhage, hydrocephalus, extra-axial collection or mass lesion/mass effect. Vascular: Negative for hyperdense vessel Skull: Negative Sinuses: Negative Orbits: Negative Review of the MIP images confirms the above findings CTA NECK FINDINGS Aortic arch: Standard branching. Imaged portion shows no evidence of aneurysm or dissection. No significant stenosis of the major arch vessel origins. Right carotid system: Mild atherosclerotic disease right common carotid artery. Atherosclerotic calcification proximal right internal carotid artery narrowing the lumen to 50% diameter stenosis. Left carotid system: Left common carotid artery widely patent with mild atherosclerotic disease. Mild atherosclerotic calcification proximal left internal carotid artery without significant stenosis. Vertebral arteries: Both vertebral arteries are widely patent without stenosis. Right vertebral artery dominant. Skeleton: No acute abnormality. Other neck: Negative for mass or adenopathy. Upper chest: Lung apices clear  bilaterally. Review of the MIP images confirms the above findings CTA HEAD FINDINGS Anterior circulation: Atherosclerotic disease in the cavernous carotid bilaterally with mild narrowing bilaterally. Anterior and middle cerebral arteries are patent bilaterally with mild irregularity. Intracranial circulation is diffusely of small caliber. Some of this is due to atherosclerotic disease and some is congenital. Posterior circulation: Both vertebral arteries patent to the basilar. PICA patent bilaterally. Basilar is small but patent. Superior cerebellar arteries patent bilaterally. Posterior cerebral arteries are small and irregular but without critical stenosis. Venous sinuses: Normal enhancement Anatomic variants: None Review of the MIP images confirms the above findings IMPRESSION: 1. 50% diameter stenosis proximal right internal carotid artery. Left carotid with mild atherosclerotic disease but no significant stenosis. Both vertebral arteries patent in the neck. 2. Small intracranial circulation diffusely due to small vessels and superimposed atherosclerotic disease. Mild irregularity in the anterior and posterior circulation. No critical stenosis or large vessel occlusion. 3. No acute intracranial abnormality. Electronically Signed   By: Franchot Gallo M.D.   On: 10/19/2018 12:51   Ct Angio Neck W And/or Wo Contrast  Result Date: 10/19/2018 CLINICAL DATA:  TIA, left-sided numbness EXAM: CT ANGIOGRAPHY HEAD AND NECK TECHNIQUE: Multidetector CT imaging of the head and neck was performed using the standard protocol during bolus administration of intravenous contrast. Multiplanar CT image reconstructions and MIPs were obtained to evaluate the vascular anatomy. Carotid stenosis measurements (when applicable) are obtained utilizing NASCET criteria, using the distal internal carotid diameter as the denominator. CONTRAST:  143mL OMNIPAQUE IOHEXOL 350 MG/ML SOLN COMPARISON:  CT head 05/16/2017 FINDINGS: CT HEAD FINDINGS  Brain: No evidence of acute infarction, hemorrhage, hydrocephalus, extra-axial collection  or mass lesion/mass effect. Vascular: Negative for hyperdense vessel Skull: Negative Sinuses: Negative Orbits: Negative Review of the MIP images confirms the above findings CTA NECK FINDINGS Aortic arch: Standard branching. Imaged portion shows no evidence of aneurysm or dissection. No significant stenosis of the major arch vessel origins. Right carotid system: Mild atherosclerotic disease right common carotid artery. Atherosclerotic calcification proximal right internal carotid artery narrowing the lumen to 50% diameter stenosis. Left carotid system: Left common carotid artery widely patent with mild atherosclerotic disease. Mild atherosclerotic calcification proximal left internal carotid artery without significant stenosis. Vertebral arteries: Both vertebral arteries are widely patent without stenosis. Right vertebral artery dominant. Skeleton: No acute abnormality. Other neck: Negative for mass or adenopathy. Upper chest: Lung apices clear bilaterally. Review of the MIP images confirms the above findings CTA HEAD FINDINGS Anterior circulation: Atherosclerotic disease in the cavernous carotid bilaterally with mild narrowing bilaterally. Anterior and middle cerebral arteries are patent bilaterally with mild irregularity. Intracranial circulation is diffusely of small caliber. Some of this is due to atherosclerotic disease and some is congenital. Posterior circulation: Both vertebral arteries patent to the basilar. PICA patent bilaterally. Basilar is small but patent. Superior cerebellar arteries patent bilaterally. Posterior cerebral arteries are small and irregular but without critical stenosis. Venous sinuses: Normal enhancement Anatomic variants: None Review of the MIP images confirms the above findings IMPRESSION: 1. 50% diameter stenosis proximal right internal carotid artery. Left carotid with mild atherosclerotic  disease but no significant stenosis. Both vertebral arteries patent in the neck. 2. Small intracranial circulation diffusely due to small vessels and superimposed atherosclerotic disease. Mild irregularity in the anterior and posterior circulation. No critical stenosis or large vessel occlusion. 3. No acute intracranial abnormality. Electronically Signed   By: Franchot Gallo M.D.   On: 10/19/2018 12:51   Dg Chest Portable 1 View  Result Date: 10/19/2018 CLINICAL DATA:  Acute chest pain today. EXAM: PORTABLE CHEST 1 VIEW COMPARISON:  01/01/2018 and prior chest radiographs FINDINGS: The cardiomediastinal silhouette is unremarkable. There is no evidence of focal airspace disease, pulmonary edema, suspicious pulmonary nodule/mass, pleural effusion, or pneumothorax. No acute bony abnormalities are identified. IMPRESSION: No active disease. Electronically Signed   By: Margarette Canada M.D.   On: 10/19/2018 11:51   Ct Angio Chest Aorta W/cm &/or Wo/cm  Result Date: 10/19/2018 CLINICAL DATA:  Patient woke up with left-sided arm weakness that is progressively worsened. Left-sided chest tightness and discomfort. EXAM: CT ANGIOGRAPHY CHEST WITH CONTRAST TECHNIQUE: Multidetector CT imaging of the chest was performed using the standard protocol during bolus administration of intravenous contrast. Multiplanar CT image reconstructions and MIPs were obtained to evaluate the vascular anatomy. CONTRAST:  167mL OMNIPAQUE IOHEXOL 350 MG/ML SOLN COMPARISON:  Chest x-ray October 19, 2018. CT of the chest October 13, 2010. FINDINGS: Cardiovascular: Atherosclerosis is seen in the right coronary artery. The heart is normal in appearance. The central pulmonary arteries are normal. Mild atherosclerotic changes are seen in the thoracic aorta. No aneurysm is identified. No dissection is identified in the thoracic aorta. Mild atherosclerotic changes are seen at the origins of the great vessels off of the aortic arch. There is no significant  narrowing of any of these vessels. The most significant atherosclerosis is at the origin of the left carotid artery as seen on sagittal image 104 but the origin remains widely patent. The carotid arteries and subclavian arteries are otherwise normal. The vertebral arteries are patent. Mediastinum/Nodes: No enlarged mediastinal, hilar, or axillary lymph nodes. Thyroid gland, trachea, and esophagus  demonstrate no significant findings. Lungs/Pleura: Lungs are clear. No pleural effusion or pneumothorax. Upper Abdomen: Patient is status post cholecystectomy. Low-attenuation in the posterior aspect of both kidneys is likely due to streak artifact which limits evaluation. No other abnormalities are seen in the upper abdomen. Musculoskeletal: No chest wall abnormality. No acute or significant osseous findings. Review of the MIP images confirms the above findings. IMPRESSION: 1. No aneurysm or dissection in the thoracic aorta or branching vessels. Atherosclerotic changes are seen in the aorta and at the origin of the branching vessels without significant stenosis. 2. Coronary artery calcifications on the right. Aortic Atherosclerosis (ICD10-I70.0). Electronically Signed   By: Dorise Bullion III M.D   On: 10/19/2018 12:45    EKG: Independently reviewed.  Sinus rhythm, PVC.  Slight ST elevation in lateral leads which probably represents normal early repolarization pattern.  No acute ST changes.  Assessment/Plan: Principal Problem:   Left sided numbness Active Problems:   Hypothyroidism   Bipolar 2 disorder (HCC)   History of rheumatoid arthritis   Seizure disorder ([place])   Uncontrolled type 2 DM with hyperosmolar nonketotic hyperglycemia ([place])    This patient was discussed with the ED physician, including pertinent vitals, physical exam findings, labs, and imaging.  We also discussed care given by the ED provider.  1. Left-sided numbness Observation on telemetry MRI head Echocardiogram tomorrow Hemoglobin  A1c, lipid panel in the morning PT/OT/speech therapy consult Full aspirin Plavix per neurology 2. Chest heaviness a. Patient will get an echo in the morning  b. Does not appear to be cardiac related 3. Uncontrolled type 2 diabetes with hyperosmolar nonketotic hyperglycemia a. Start insulin drip b. BMP every 4 hours c. CBGs every hour 4. Hypothyroidism a. Continue Synthroid b. Check TSH in the morning 5. Hypertension a. Continue antihypertensives 6. Bipolar a. Continue medication 7. Rheumatoid arthritis a. Continue Duragesic patch  DVT prophylaxis: Lovenox Consultants: Neurology Code Status: Full code Family Communication: None Disposition Plan: Patient should be able to return home following admission.   Truett Mainland, DO

## 2018-10-19 NOTE — ED Notes (Signed)
Activated code stroke with kimberly from [place]

## 2018-10-19 NOTE — ED Notes (Signed)
ED TO INPATIENT HANDOFF REPORT  ED Nurse Name and Phone #: Harriette Bouillon [phone]  S Name/Age/Gender Vincent [name] 53 y.o. male Room/Bed: 041C/041C  Code Status   Code Status: Prior  Home/SNF/Other Home Patient oriented to: self, place, time and situation Is this baseline? Yes   Triage Complete: Triage complete  Chief Complaint cp  Triage Note Pt states that around 4 am today he began to have CP along with left sided arm numbness and tingling ; pt also reports having numbness to the whole left body ; lkw 11 pm last night ; pt alert and oriented x 4 and following simple commands ; pt denies any sob or new cough ; denies any blurred vision ; pt was going home today when he had to pull over because he felt like he was going to pass out    Allergies Allergies  Allergen Reactions  . Celebrex [Celecoxib] Anaphylaxis and Rash  . Hydrocodone Rash and Other (See Comments)    "blisters developed on arms"  . Sulfa Antibiotics Rash    Level of Care/Admitting Diagnosis ED Disposition    ED Disposition Condition [facility]: [place] [[id number]]  Level of Care: Telemetry Medical [104]  Covid Evaluation: Asymptomatic Screening Protocol (No Symptoms)  Diagnosis: Uncontrolled type 2 DM with hyperosmolar nonketotic hyperglycemia [facility]) [[id number]]  Admitting Physician: Truett Mainland [[id number]]  Attending Physician: Truett Mainland [[id number]]  Estimated length of stay: past midnight tomorrow  Certification:: I certify this patient will need inpatient services for at least 2 midnights  PT Class (Do Not Modify): Inpatient [101]  PT Acc Code (Do Not Modify): Private [1]       B Medical/Surgery History Past Medical History:  Diagnosis Date  . Anxiety   . Bilateral carpal tunnel syndrome   . Bipolar 1 disorder ([place])   . CAD (coronary artery disease)    Non obstructive on CTA Oct 2019.   Marland Kitchen Chronic pain syndrome    back  . Cold extremities    BLE  . COPD  (chronic obstructive pulmonary disease) ([place])   . Cubital tunnel syndrome on right   . DDD (degenerative disc disease), lumbar   . Diabetic peripheral neuropathy ([place])   . Gastroparesis   . GERD (gastroesophageal reflux disease)   . Hiatal hernia   . History of bladder cancer urologist-  dr Consuella Lose   papillay TCC (Ta G1)  s/p TURBT and chemo instillation 2014  . History of bronchitis   . History of carpal tunnel syndrome    Bilateral  . History of chest pain 12/2017  . History of chest pain 12/2017   heart cath normal  . History of encephalopathy 05/27/2015   admission w/ acute encephalopathy thought to be secondary to pain meds and COPD  . History of gastric ulcer   . History of Helicobacter pylori infection   . History of kidney stones    not aware  . History of TIA (transient ischemic attack) 2008    no residual  . History of traumatic head injury 2010   w/ LOC  per pt needed stitches, hit in head with a mower blade  . Hyperlipidemia   . Hypertension   . Hypothyroidism   . Insomnia    per sleep study 04-19-2015 without sleep apnea  . Neuropathy in diabetes ([place])    LOWER EXTREMITIES  . PTSD (post-traumatic stress disorder)   . RA (rheumatoid arthritis) ([place])   . Seizures,  transient [facility]) neurologist-  dr Krista Blue--  differential dx complex partial seizure .vs.  mood disorder .vs.  pseudoseizure--  negative EEG's   confusion episodes and staring spells since 11/ 2015  . Sleep apnea    Mild, NO CPAP ORDERED  . Transient confusion NEUOROLOGIST-  DR YAN   Episodes since 11/ 2015--  neurologist dx  differential complex partial seizure  .vs. mood disorder . vs. pseudoseizure  . Tremor   . Type 2 diabetes mellitus treated with insulin [facility])    Past Surgical History:  Procedure Laterality Date  . CARDIAC CATHETERIZATION  12-27-2001  DR Einar Gip  &  05-26-2009  DR Irish Lack   RESULTS FOR BOTH ARE NORMAL CORONARIES AND PERSERVED LVF/ EF 60%  . CARPAL TUNNEL RELEASE Right 09-16-2003  .  CARPAL TUNNEL RELEASE Left 02/25/2015   Procedure: LEFT CARPAL TUNNEL RELEASE;  Surgeon: Leanora Cover, MD;  Location: [facility];  Service: Orthopedics;  Laterality: Left;  . COLONOSCOPY  2014  . CYSTOSCOPY N/A 10/10/2012   Procedure: CYSTOSCOPY CLOT EVACUATION FULGERATION OF BLEEDERS ;  Surgeon: Claybon Jabs, MD;  Location: [facility];  Service: Urology;  Laterality: N/A;  . CYSTOSCOPY WITH BIOPSY N/A 11/26/2015   Procedure: CYSTOSCOPY WITH BIOPSY AND FULGURATION;  Surgeon: Kathie Rhodes, MD;  Location: [facility];  Service: Urology;  Laterality: N/A;  . ESOPHAGOGASTRODUODENOSCOPY  2014  . LAPAROSCOPIC CHOLECYSTECTOMY  11-17-2010  . NEGATIVE SLEEP STUDY  04-19-2015  in epic  . ORCHIECTOMY Right 02/21/2016   Procedure: SCROTAL ORCHIECTOMY with TESTICULAR PROSTHESIS IMPLANT;  Surgeon: Kathie Rhodes, MD;  Location[facility];  Service: Urology;  Laterality: Right;  . ORCHIECTOMY Left 09/02/2018   Procedure: ORCHIECTOMY;  Surgeon: Kathie Rhodes, MD;  Location: [facility];  Service: Urology;  Laterality: Left;  . ROTATOR CUFF REPAIR Right 12/2004  . TRANSURETHRAL RESECTION OF BLADDER TUMOR N/A 08/09/2012   Procedure: TRANSURETHRAL RESECTION OF BLADDER TUMOR (TURBT) WITH GYRUS WITH MITOMYCIN C;  Surgeon: Claybon Jabs, MD;  Location: [facility];  Service: Urology;  Laterality: N/A;  . TRANSURETHRAL RESECTION OF BLADDER TUMOR WITH GYRUS (TURBT-GYRUS) N/A 02/27/2014   Procedure: TRANSURETHRAL RESECTION OF BLADDER TUMOR WITH GYRUS (TURBT-GYRUS);  Surgeon: Claybon Jabs, MD;  Location: [facility];  Service: Urology;  Laterality: N/A;     A IV Location/Drains/Wounds Patient Lines/Drains/Airways Status   Active Line/Drains/Airways    Name:   Placement date:   Placement time:   Site:   Days:   Peripheral IV 10/19/18 Right Forearm   10/19/18    1130    Forearm   less than 1   Incision (Closed)  11/26/15 Penis   11/26/15    0753     1058   Incision (Closed) 02/21/16 Scrotum Other (Comment)   02/21/16    0915     971   Incision (Closed) 09/02/18 Scrotum Other (Comment)   09/02/18    1002     47          Intake/Output Last 24 hours No intake or output data in the 24 hours ending 10/19/18 1824  Labs/Imaging Results for orders placed or performed during the hospital encounter of 10/19/18 (from the past 48 hour(s))  CBG monitoring, ED     Status: Abnormal   Collection Time: 10/19/18 11:08 AM  Result Value Ref Range   Glucose-Capillary >600 (HH) 70 - 99 mg/dL   Comment 1 Notify RN    Comment  2 Document in Chart   Ethanol     Status: None   Collection Time: 10/19/18 11:27 AM  Result Value Ref Range   Alcohol, Ethyl (B) <10 <10 mg/dL    Comment: (NOTE) Lowest detectable limit for serum alcohol is 10 mg/dL. For medical purposes only. Performed at [facility], [place] [address] [place], [place] [address]   Protime-INR     Status: None   Collection Time: 10/19/18 11:27 AM  Result Value Ref Range   Prothrombin Time 12.8 11.4 - 15.2 seconds   INR 1.0 0.8 - 1.2    Comment: (NOTE) INR goal varies based on device and disease states. Performed at [facility], [place] [address] [place], [place] [address]   APTT     Status: None   Collection Time: 10/19/18 11:27 AM  Result Value Ref Range   aPTT 31 24 - 36 seconds    Comment: Performed at [place] [address] [place], [place] [address]  Comprehensive metabolic panel     Status: Abnormal   Collection Time: 10/19/18 11:27 AM  Result Value Ref Range   Sodium 128 (L) 135 - 145 mmol/L   Potassium 4.8 3.5 - 5.1 mmol/L   Chloride 94 (L) 98 - 111 mmol/L   CO2 19 (L) 22 - 32 mmol/L   Glucose, Bld 679 (HH) 70 - 99 mg/dL    Comment: CRITICAL RESULT CALLED TO, READ BACK BY AND VERIFIED WITH: L.Sandie Swayze,RN @ 1236 10/19/2018 [id number]    BUN 15 6 - 20 mg/dL   Creatinine, Ser 1.29 (H) 0.61 - 1.24 mg/dL   Calcium 9.5  8.9 - 10.3 mg/dL   Total Protein 6.8 6.5 - 8.1 g/dL   Albumin 4.2 3.5 - 5.0 g/dL   AST 16 15 - 41 U/L   ALT 19 0 - 44 U/L   Alkaline Phosphatase 65 38 - 126 U/L   Total Bilirubin 0.9 0.3 - 1.2 mg/dL   GFR calc non Af Amer >60 >60 mL/min   GFR calc Af Amer >60 >60 mL/min   Anion gap 15 5 - 15    Comment: Performed at [facility], [place] [address], [place], [place] [address]  Troponin I (High Sensitivity)     Status: None   Collection Time: 10/19/18 11:27 AM  Result Value Ref Range   Troponin I (High Sensitivity) 4 <18 ng/L    Comment: (NOTE) Elevated high sensitivity troponin I (hsTnI) values and significant  changes across serial measurements may suggest ACS but many other  chronic and acute conditions are known to elevate hsTnI results.  Refer to the Links section for chest pain algorithms and additional  guidance. Performed at [facility], [place] [address], [place], [place] [address]   I-stat chem 8, ED     Status: Abnormal   Collection Time: 10/19/18 11:35 AM  Result Value Ref Range   Sodium 128 (L) 135 - 145 mmol/L   Potassium 5.0 3.5 - 5.1 mmol/L   Chloride 98 98 - 111 mmol/L   BUN 18 6 - 20 mg/dL   Creatinine, Ser 1.10 0.61 - 1.24 mg/dL   Glucose, Bld 668 (HH) 70 - 99 mg/dL   Calcium, Ion 1.11 (L) 1.15 - 1.40 mmol/L   TCO2 20 (L) 22 - 32 mmol/L   Hemoglobin 17.0 13.0 - 17.0 g/dL   HCT 50.0 39.0 - 52.0 %   Comment NOTIFIED PHYSICIAN   CBC with Differential/Platelet  Status: Abnormal   Collection Time: 10/19/18 12:02 PM  Result Value Ref Range   WBC 8.8 4.0 - 10.5 K/uL   RBC 5.78 4.22 - 5.81 MIL/uL   Hemoglobin 17.1 (H) 13.0 - 17.0 g/dL   HCT 48.6 39.0 - 52.0 %   MCV 84.1 80.0 - 100.0 fL   MCH 29.6 26.0 - 34.0 pg   MCHC 35.2 30.0 - 36.0 g/dL   RDW 12.0 11.5 - 15.5 %   Platelets 243 150 - 400 K/uL   nRBC 0.0 0.0 - 0.2 %   Neutrophils Relative % 66 %   Neutro Abs 5.9 1.7 - 7.7 K/uL   Lymphocytes Relative 26 %   Lymphs Abs 2.3 0.7 - 4.0 K/uL    Monocytes Relative 6 %   Monocytes Absolute 0.5 0.1 - 1.0 K/uL   Eosinophils Relative 0 %   Eosinophils Absolute 0.0 0.0 - 0.5 K/uL   Basophils Relative 1 %   Basophils Absolute 0.1 0.0 - 0.1 K/uL   Immature Granulocytes 1 %   Abs Immature Granulocytes 0.05 0.00 - 0.07 K/uL    Comment: Performed at [place] [address] [place],  [address]  SARS Coronavirus 2 [facility] order, Performed in [facility] hospital lab) Nasopharyngeal Nasopharyngeal Swab     Status: None   Collection Time: 10/19/18 12:53 PM   Specimen: Nasopharyngeal Swab  Result Value Ref Range   SARS Coronavirus 2 NEGATIVE NEGATIVE    Comment: (NOTE) If result is NEGATIVE SARS-CoV-2 target nucleic acids are NOT DETECTED. The SARS-CoV-2 RNA is generally detectable in upper and lower  respiratory specimens during the acute phase of infection. The lowest  concentration of SARS-CoV-2 viral copies this assay can detect is 250  copies / mL. A negative result does not preclude SARS-CoV-2 infection  and should not be used as the sole basis for treatment or other  patient management decisions.  A negative result may occur with  improper specimen collection / handling, submission of specimen other  than nasopharyngeal swab, presence of viral mutation(s) within the  areas targeted by this assay, and inadequate number of viral copies  (<250 copies / mL). A negative result must be combined with clinical  observations, patient history, and epidemiological information. If result is POSITIVE SARS-CoV-2 target nucleic acids are DETECTED. The SARS-CoV-2 RNA is generally detectable in upper and lower  respiratory specimens dur ing the acute phase of infection.  Positive  results are indicative of active infection with SARS-CoV-2.  Clinical  correlation with patient history and other diagnostic information is  necessary to determine patient infection status.  Positive results do  not rule out bacterial infection or  co-infection with other viruses. If result is PRESUMPTIVE POSTIVE SARS-CoV-2 nucleic acids MAY BE PRESENT.   A presumptive positive result was obtained on the submitted specimen  and confirmed on repeat testing.  While 2019 novel coronavirus  (SARS-CoV-2) nucleic acids may be present in the submitted sample  additional confirmatory testing may be necessary for epidemiological  and / or clinical management purposes  to differentiate between  SARS-CoV-2 and other Sarbecovirus currently known to infect humans.  If clinically indicated additional testing with an alternate test  methodology [phone]) is advised. The SARS-CoV-2 RNA is generally  detectable in upper and lower respiratory sp ecimens during the acute  phase of infection. The expected result is Negative. Fact Sheet for Patients:  StrictlyIdeas.no Fact Sheet for Healthcare Providers: BankingDealers.co.za This test is not yet approved or cleared  by the [place] and has been authorized for detection and/or diagnosis of SARS-CoV-2 by FDA under an Emergency Use Authorization (EUA).  This EUA will remain in effect (meaning this test can be used) for the duration of the COVID-19 declaration under Section 564(b)(1) of the Act, 21 U.S.C. section 360bbb-3(b)(1), unless the authorization is terminated or revoked sooner. Performed at [facility], [place] [address] [place], [place] [address]   Urinalysis, Routine w reflex microscopic     Status: Abnormal   Collection Time: 10/19/18  1:00 PM  Result Value Ref Range   Color, Urine STRAW (A) YELLOW   APPearance CLEAR CLEAR   Specific Gravity, Urine 1.032 (H) 1.005 - 1.030   pH 6.0 5.0 - 8.0   Glucose, UA >=500 (A) NEGATIVE mg/dL   Hgb urine dipstick NEGATIVE NEGATIVE   Bilirubin Urine NEGATIVE NEGATIVE   Ketones, ur NEGATIVE NEGATIVE mg/dL   Protein, ur NEGATIVE NEGATIVE mg/dL   Nitrite NEGATIVE NEGATIVE   Leukocytes,Ua  NEGATIVE NEGATIVE   RBC / HPF 0-5 0 - 5 RBC/hpf   Bacteria, UA NONE SEEN NONE SEEN   Hyaline Casts, UA PRESENT     Comment: Performed at [facility] [address] [place], [place] [address]  Urine rapid drug screen (hosp performed)     Status: None   Collection Time: 10/19/18  1:00 PM  Result Value Ref Range   Opiates NONE DETECTED NONE DETECTED   Cocaine NONE DETECTED NONE DETECTED   Benzodiazepines NONE DETECTED NONE DETECTED   Amphetamines NONE DETECTED NONE DETECTED   Tetrahydrocannabinol NONE DETECTED NONE DETECTED   Barbiturates NONE DETECTED NONE DETECTED    Comment: (NOTE) DRUG SCREEN FOR MEDICAL PURPOSES ONLY.  IF CONFIRMATION IS NEEDED FOR ANY PURPOSE, NOTIFY LAB WITHIN 5 DAYS. LOWEST DETECTABLE LIMITS FOR URINE DRUG SCREEN Drug Class                     Cutoff (ng/mL) Amphetamine and metabolites    1000 Barbiturate and metabolites    200 Benzodiazepine                 419 Tricyclics and metabolites     300 Opiates and metabolites        300 Cocaine and metabolites        300 THC                            50 Performed at [facility], [place] [address] [place], [place] [address]   Troponin I (High Sensitivity)     Status: None   Collection Time: 10/19/18  1:01 PM  Result Value Ref Range   Troponin I (High Sensitivity) 5 <18 ng/L    Comment: (NOTE) Elevated high sensitivity troponin I (hsTnI) values and significant  changes across serial measurements may suggest ACS but many other  chronic and acute conditions are known to elevate hsTnI results.  Refer to the "Links" section for chest pain algorithms and additional  guidance. Performed at [facility], [place] [address], [place], [place] [address]   CBG monitoring, ED     Status: Abnormal   Collection Time: 10/19/18  2:45 PM  Result Value Ref Range   Glucose-Capillary 449 (H) 70 - 99 mg/dL   Comment 1 Notify RN    Comment 2 Document in Chart   CBG monitoring, ED     Status: Abnormal  Collection Time: 10/19/18  4:28 PM  Result Value Ref Range   Glucose-Capillary 333 (H) 70 - 99 mg/dL   Comment 1 Notify RN    Comment 2 Document in Chart   CBG monitoring, ED     Status: Abnormal   Collection Time: 10/19/18  5:53 PM  Result Value Ref Range   Glucose-Capillary 276 (H) 70 - 99 mg/dL   Ct Angio Head W Or Wo Contrast  Result Date: 10/19/2018 CLINICAL DATA:  TIA, left-sided numbness EXAM: CT ANGIOGRAPHY HEAD AND NECK TECHNIQUE: Multidetector CT imaging of the head and neck was performed using the standard protocol during bolus administration of intravenous contrast. Multiplanar CT image reconstructions and MIPs were obtained to evaluate the vascular anatomy. Carotid stenosis measurements (when applicable) are obtained utilizing NASCET criteria, using the distal internal carotid diameter as the denominator. CONTRAST:  152mL OMNIPAQUE IOHEXOL 350 MG/ML SOLN COMPARISON:  CT head 05/16/2017 FINDINGS: CT HEAD FINDINGS Brain: No evidence of acute infarction, hemorrhage, hydrocephalus, extra-axial collection or mass lesion/mass effect. Vascular: Negative for hyperdense vessel Skull: Negative Sinuses: Negative Orbits: Negative Review of the MIP images confirms the above findings CTA NECK FINDINGS Aortic arch: Standard branching. Imaged portion shows no evidence of aneurysm or dissection. No significant stenosis of the major arch vessel origins. Right carotid system: Mild atherosclerotic disease right common carotid artery. Atherosclerotic calcification proximal right internal carotid artery narrowing the lumen to 50% diameter stenosis. Left carotid system: Left common carotid artery widely patent with mild atherosclerotic disease. Mild atherosclerotic calcification proximal left internal carotid artery without significant stenosis. Vertebral arteries: Both vertebral arteries are widely patent without stenosis. Right vertebral artery dominant. Skeleton: No acute abnormality. Other neck: Negative for  mass or adenopathy. Upper chest: Lung apices clear bilaterally. Review of the MIP images confirms the above findings CTA HEAD FINDINGS Anterior circulation: Atherosclerotic disease in the cavernous carotid bilaterally with mild narrowing bilaterally. Anterior and middle cerebral arteries are patent bilaterally with mild irregularity. Intracranial circulation is diffusely of small caliber. Some of this is due to atherosclerotic disease and some is congenital. Posterior circulation: Both vertebral arteries patent to the basilar. PICA patent bilaterally. Basilar is small but patent. Superior cerebellar arteries patent bilaterally. Posterior cerebral arteries are small and irregular but without critical stenosis. Venous sinuses: Normal enhancement Anatomic variants: None Review of the MIP images confirms the above findings IMPRESSION: 1. 50% diameter stenosis proximal right internal carotid artery. Left carotid with mild atherosclerotic disease but no significant stenosis. Both vertebral arteries patent in the neck. 2. Small intracranial circulation diffusely due to small vessels and superimposed atherosclerotic disease. Mild irregularity in the anterior and posterior circulation. No critical stenosis or large vessel occlusion. 3. No acute intracranial abnormality. Electronically Signed   By: Franchot Gallo M.D.   On: 10/19/2018 12:51   Ct Angio Neck W And/or Wo Contrast  Result Date: 10/19/2018 CLINICAL DATA:  TIA, left-sided numbness EXAM: CT ANGIOGRAPHY HEAD AND NECK TECHNIQUE: Multidetector CT imaging of the head and neck was performed using the standard protocol during bolus administration of intravenous contrast. Multiplanar CT image reconstructions and MIPs were obtained to evaluate the vascular anatomy. Carotid stenosis measurements (when applicable) are obtained utilizing NASCET criteria, using the distal internal carotid diameter as the denominator. CONTRAST:  121mL OMNIPAQUE IOHEXOL 350 MG/ML SOLN  COMPARISON:  CT head 05/16/2017 FINDINGS: CT HEAD FINDINGS Brain: No evidence of acute infarction, hemorrhage, hydrocephalus, extra-axial collection or mass lesion/mass effect. Vascular: Negative for hyperdense vessel Skull: Negative Sinuses: Negative Orbits: Negative  Review of the MIP images confirms the above findings CTA NECK FINDINGS Aortic arch: Standard branching. Imaged portion shows no evidence of aneurysm or dissection. No significant stenosis of the major arch vessel origins. Right carotid system: Mild atherosclerotic disease right common carotid artery. Atherosclerotic calcification proximal right internal carotid artery narrowing the lumen to 50% diameter stenosis. Left carotid system: Left common carotid artery widely patent with mild atherosclerotic disease. Mild atherosclerotic calcification proximal left internal carotid artery without significant stenosis. Vertebral arteries: Both vertebral arteries are widely patent without stenosis. Right vertebral artery dominant. Skeleton: No acute abnormality. Other neck: Negative for mass or adenopathy. Upper chest: Lung apices clear bilaterally. Review of the MIP images confirms the above findings CTA HEAD FINDINGS Anterior circulation: Atherosclerotic disease in the cavernous carotid bilaterally with mild narrowing bilaterally. Anterior and middle cerebral arteries are patent bilaterally with mild irregularity. Intracranial circulation is diffusely of small caliber. Some of this is due to atherosclerotic disease and some is congenital. Posterior circulation: Both vertebral arteries patent to the basilar. PICA patent bilaterally. Basilar is small but patent. Superior cerebellar arteries patent bilaterally. Posterior cerebral arteries are small and irregular but without critical stenosis. Venous sinuses: Normal enhancement Anatomic variants: None Review of the MIP images confirms the above findings IMPRESSION: 1. 50% diameter stenosis proximal right internal  carotid artery. Left carotid with mild atherosclerotic disease but no significant stenosis. Both vertebral arteries patent in the neck. 2. Small intracranial circulation diffusely due to small vessels and superimposed atherosclerotic disease. Mild irregularity in the anterior and posterior circulation. No critical stenosis or large vessel occlusion. 3. No acute intracranial abnormality. Electronically Signed   By: Franchot Gallo M.D.   On: 10/19/2018 12:51   Dg Chest Portable 1 View  Result Date: 10/19/2018 CLINICAL DATA:  Acute chest pain today. EXAM: PORTABLE CHEST 1 VIEW COMPARISON:  01/01/2018 and prior chest radiographs FINDINGS: The cardiomediastinal silhouette is unremarkable. There is no evidence of focal airspace disease, pulmonary edema, suspicious pulmonary nodule/mass, pleural effusion, or pneumothorax. No acute bony abnormalities are identified. IMPRESSION: No active disease. Electronically Signed   By: Margarette Canada M.D.   On: 10/19/2018 11:51   Ct Angio Chest Aorta W/cm &/or Wo/cm  Result Date: 10/19/2018 CLINICAL DATA:  Patient woke up with left-sided arm weakness that is progressively worsened. Left-sided chest tightness and discomfort. EXAM: CT ANGIOGRAPHY CHEST WITH CONTRAST TECHNIQUE: Multidetector CT imaging of the chest was performed using the standard protocol during bolus administration of intravenous contrast. Multiplanar CT image reconstructions and MIPs were obtained to evaluate the vascular anatomy. CONTRAST:  166mL OMNIPAQUE IOHEXOL 350 MG/ML SOLN COMPARISON:  Chest x-ray October 19, 2018. CT of the chest October 13, 2010. FINDINGS: Cardiovascular: Atherosclerosis is seen in the right coronary artery. The heart is normal in appearance. The central pulmonary arteries are normal. Mild atherosclerotic changes are seen in the thoracic aorta. No aneurysm is identified. No dissection is identified in the thoracic aorta. Mild atherosclerotic changes are seen at the origins of the great vessels  off of the aortic arch. There is no significant narrowing of any of these vessels. The most significant atherosclerosis is at the origin of the left carotid artery as seen on sagittal image 104 but the origin remains widely patent. The carotid arteries and subclavian arteries are otherwise normal. The vertebral arteries are patent. Mediastinum/Nodes: No enlarged mediastinal, hilar, or axillary lymph nodes. Thyroid gland, trachea, and esophagus demonstrate no significant findings. Lungs/Pleura: Lungs are clear. No pleural effusion or pneumothorax. Upper Abdomen:  Patient is status post cholecystectomy. Low-attenuation in the posterior aspect of both kidneys is likely due to streak artifact which limits evaluation. No other abnormalities are seen in the upper abdomen. Musculoskeletal: No chest wall abnormality. No acute or significant osseous findings. Review of the MIP images confirms the above findings. IMPRESSION: 1. No aneurysm or dissection in the thoracic aorta or branching vessels. Atherosclerotic changes are seen in the aorta and at the origin of the branching vessels without significant stenosis. 2. Coronary artery calcifications on the right. Aortic Atherosclerosis (ICD10-I70.0). Electronically Signed   By: Dorise Bullion III M.D   On: 10/19/2018 12:45    Pending Labs Unresulted Labs (From admission, onward)    Start     Ordered   10/19/18 6546  Basic metabolic panel  Now then every 4 hours,   R (with STAT occurrences)     10/19/18 1428   10/19/18 1108  CBC with Differential  ONCE - STAT,   STAT     10/19/18 1108   Signed and Held  Hemoglobin A1c  Once,   R    Comments: To assess prior glycemic control.    Signed and Held   Signed and Held  Hemoglobin A1c  Tomorrow morning,   R     Signed and Held   Signed and Held  Lipid panel  Tomorrow morning,   R    Comments: Fasting    Signed and Held   Signed and Held  Basic metabolic panel  STAT Now then every 4 hours ,   STAT     Signed and Held    Signed and Held  TSH  Tomorrow morning,   R     Signed and Held          Vitals/Pain Today's Vitals   10/19/18 1115 10/19/18 1200 10/19/18 1204 10/19/18 1629  BP: (!) 103/91 (!) 145/83  (!) 158/80  Pulse: 92 89  78  Resp: (!) 24 17  12   Temp:      TempSrc:      SpO2: 96% 98%  99%  Weight:   106.1 kg   Height:   6' (1.829 m)   PainSc:    0-No pain    Isolation Precautions No active isolations  Medications Medications  sodium chloride 0.9 % bolus 1,000 mL (has no administration in time range)  clopidogrel (PLAVIX) tablet 75 mg (has no administration in time range)  dextrose 5 %-0.45 % sodium chloride infusion (has no administration in time range)  insulin regular bolus via infusion 0-10 Units (has no administration in time range)  insulin regular, human (MYXREDLIN) 100 units/ 100 mL infusion (has no administration in time range)  dextrose 50 % solution 25 mL (has no administration in time range)  0.9 %  sodium chloride infusion (has no administration in time range)  sodium chloride 0.9 % bolus 1,000 mL (1,000 mLs Intravenous New Bag/Given 10/19/18 1451)  iohexol (OMNIPAQUE) 350 MG/ML injection 100 mL (100 mLs Intravenous Contrast Given 10/19/18 1137)  clopidogrel (PLAVIX) tablet 300 mg (300 mg Oral Given 10/19/18 1400)  aspirin EC tablet 325 mg (325 mg Oral Given 10/19/18 1401)  insulin aspart (novoLOG) injection 5 Units (5 Units Subcutaneous Given 10/19/18 1406)    Mobility walks Low fall risk   Focused Assessments Neuro Assessment Handoff:  Swallow screen pass? Yes  Cardiac Rhythm: Normal sinus rhythm NIH Stroke Scale ( + Modified Stroke Scale Criteria)  Interval: Initial Level of Consciousness (1a.)   : Alert, keenly  responsive LOC Questions (1b. )   +: Answers both questions correctly LOC Commands (1c. )   + : Performs both tasks correctly Best Gaze (2. )  +: Normal Visual (3. )  +: No visual loss Facial Palsy (4. )    : Normal symmetrical movements Motor Arm, Left  (5a. )   +: Drift Motor Arm, Right (5b. )   +: No drift Motor Leg, Left (6a. )   +: Some effort against gravity Motor Leg, Right (6b. )   +: No drift Limb Ataxia (7. ): Absent Sensory (8. )   +: Mild-to-moderate sensory loss, patient feels pinprick is less sharp or is dull on the affected side, or there is a loss of superficial pain with pinprick, but patient is aware of being touched Best Language (9. )   +: No aphasia Dysarthria (10. ): Normal Extinction/Inattention (11.)   +: No Abnormality Modified SS Total  +: 4 Complete NIHSS TOTAL: 4 Last date known well: 10/18/18 Last time known well: 2300 Neuro Assessment:   Neuro Checks:   Initial (10/19/18 1158)  Last Documented NIHSS Modified Score: 4 (10/19/18 1158) Has TPA been given? No If patient is a Neuro Trauma and patient is going to OR before floor call report to [place] nurse: [phone] or [phone]     R Recommendations: See Admitting Provider Note  Report given to:   Additional Notes:

## 2018-10-19 NOTE — ED Notes (Signed)
Canceled code stroke with kimberly from [place]

## 2018-10-19 NOTE — ED Triage Notes (Signed)
Pt states that around 4 am today he began to have CP along with left sided arm numbness and tingling ; pt also reports having numbness to the whole left body ; lkw 11 pm last night ; pt alert and oriented x 4 and following simple commands ; pt denies any sob or new cough ; denies any blurred vision ; pt was going home today when he had to pull over because he felt like he was going to pass out

## 2018-10-19 NOTE — Consult Note (Signed)
Neurology Consultation Reason for Consult: Left-sided numbness Referring Physician: Roslynn Amble, R.   CC: Left-sided numbness  History is obtained from: Patient  HPI: Vincent [name] is a 53 y.o. male with a history of tobacco abuse hypertension, hyperlipidemia, TIA, diabetes, chronic back pain who presents with left-sided numbness starting on awakening this morning.  Does report some worsening since onset of symptoms.  He states that once he was unable to walk, he decided come to seek further care in the emergency department.  He endorses symptoms in the face, arm, leg.  He denies any visual change.  He also describes that while driving, he became severely lightheaded and pulled off to the side of the road.  He is also having some chest heaviness.   LKW: 10:30 PM tpa given?: no, outside window  ROS: A 14 point ROS was performed and is negative except as noted in the HPI.   Past Medical History:  Diagnosis Date  . Anxiety   . Bilateral carpal tunnel syndrome   . Bipolar 1 disorder ([place])   . CAD (coronary artery disease)    Non obstructive on CTA Oct 2019.   Marland Kitchen Chronic pain syndrome    back  . Cold extremities    BLE  . COPD (chronic obstructive pulmonary disease) ([place])   . Cubital tunnel syndrome on right   . DDD (degenerative disc disease), lumbar   . Diabetic peripheral neuropathy ([place])   . Gastroparesis   . GERD (gastroesophageal reflux disease)   . Hiatal hernia   . History of bladder cancer urologist-  dr Consuella Lose   papillay TCC (Ta G1)  s/p TURBT and chemo instillation 2014  . History of bronchitis   . History of carpal tunnel syndrome    Bilateral  . History of chest pain 12/2017  . History of chest pain 12/2017   heart cath normal  . History of encephalopathy 05/27/2015   admission w/ acute encephalopathy thought to be secondary to pain meds and COPD  . History of gastric ulcer   . History of Helicobacter pylori infection   . History of kidney stones    not aware   . History of TIA (transient ischemic attack) 2008    no residual  . History of traumatic head injury 2010   w/ LOC  per pt needed stitches, hit in head with a mower blade  . Hyperlipidemia   . Hypertension   . Hypothyroidism   . Insomnia    per sleep study 04-19-2015 without sleep apnea  . Neuropathy in diabetes ([place])    LOWER EXTREMITIES  . PTSD (post-traumatic stress disorder)   . RA (rheumatoid arthritis) ([place])   . Seizures, transient [facility]) neurologist-  dr Krista Blue--  differential dx complex partial seizure .vs.  mood disorder .vs.  pseudoseizure--  negative EEG's   confusion episodes and staring spells since 11/ 2015  . Sleep apnea    Mild, NO CPAP ORDERED  . Transient confusion NEUOROLOGIST-  DR YAN   Episodes since 11/ 2015--  neurologist dx  differential complex partial seizure  .vs. mood disorder . vs. pseudoseizure  . Tremor   . Type 2 diabetes mellitus treated with insulin ([facility])      Family History  Problem Relation Age of Onset  . Diabetes Mother   . Diabetes Father   . Hypertension Father   . Heart attack Father 38       died age 8  . Alcohol abuse Father      Social  History:  reports that he has been smoking cigarettes. He has a 76.00 pack-year smoking history. He has never used smokeless tobacco. He reports that he does not drink alcohol or use drugs.   Exam: Current vital signs: BP (!) 145/88 (BP Location: Right Arm)   Pulse 94   Temp 98.3 F (36.8 C) (Oral)   Resp 11   Ht 6' (1.829 m)   Wt 106.1 kg   SpO2 98%   BMI 31.74 kg/m  Vital signs in last 24 hours: Temp:  [98.3 F (36.8 C)] 98.3 F (36.8 C) (08/08 1104) Pulse Rate:  [94] 94 (08/08 1104) Resp:  [11] 11 (08/08 1104) BP: (145)/(88) 145/88 (08/08 1104) SpO2:  [98 %] 98 % (08/08 1104) Weight:  [106.1 kg] 106.1 kg (08/08 1204)   Physical Exam  Constitutional: Appears well-developed and well-nourished.  Psych: Affect appropriate to situation Eyes: No scleral injection HENT: No OP  obstrucion Head: Normocephalic.  Cardiovascular: Normal rate and regular rhythm.  Respiratory: Effort normal, non-labored breathing GI: Soft.  No distension. There is no tenderness.  Skin: WDI  Neuro: Mental Status: Patient is awake, alert, oriented to person, place, month, year, and situation. Patient is able to give a clear and coherent history. No signs of aphasia or neglect Cranial Nerves: II: Visual Fields are full. Pupils are equal, round, and reactive to light.   III,IV, VI: EOMI without ptosis or diploplia.  V: Facial sensation is diminished on the left VII: Facial movement is symmetric.  VIII: hearing is intact to voice X: Uvula elevates symmetrically XI: Shoulder shrug is symmetric. XII: tongue is midline without atrophy or fasciculations.  Motor: Tone is normal. Bulk is normal. 5/5 strength was present on the right, 4+/5 in the left arm, 4-/5 in the left leg Sensory: Sensation is diminished on the left Cerebellar: He has marked tremor bilaterally   I have reviewed labs in epic and the results pertinent to this consultation are: Glucose greater than 600  I have reviewed the images obtained: CT/CTA-no acute intracranial hemorrhage, no large vessel occlusion  Impression: 53 year old male with left-sided numbness and weakness consistent with a acute ischemic stroke.  I suspect small vessel disease as etiology given lack of cortical findings and his multiple risk factors.  He will need admission for physical therapy and secondary risk factor work-up.  I do not think that stroke likely played a role in his presyncope that he had while driving, and this coupled with this chest heaviness does give me concern for another process occurring concomitantly.  Recommendations: - HgbA1c, fasting lipid panel - MRI  of the brain without contrast - Frequent neuro checks - Echocardiogram - Carotid dopplers - Prophylactic therapy-aspirin 81 mg daily with Plavix 75 mg daily following  300 mg load - Risk factor modification - Telemetry monitoring - PT consult, OT consult, Speech consult - Stroke team to follow - chest heaviness per ER/IM  Roland Rack, MD [facility] [phone]  If 7pm- 7am, please page neurology on call as listed in [place].

## 2018-10-19 NOTE — ED Notes (Signed)
cbg [id number]

## 2018-10-19 NOTE — ED Notes (Addendum)
Informed RN Lattie Haw of CBG check [id number]

## 2018-10-20 ENCOUNTER — Inpatient Hospital Stay (HOSPITAL_COMMUNITY): Payer: No Typology Code available for payment source

## 2018-10-20 ENCOUNTER — Other Ambulatory Visit: Payer: Self-pay

## 2018-10-20 DIAGNOSIS — R072 Precordial pain: Secondary | ICD-10-CM

## 2018-10-20 DIAGNOSIS — I6389 Other cerebral infarction: Secondary | ICD-10-CM

## 2018-10-20 DIAGNOSIS — G459 Transient cerebral ischemic attack, unspecified: Secondary | ICD-10-CM

## 2018-10-20 LAB — ECHOCARDIOGRAM COMPLETE
Height: 72 in
Weight: 3350.99 oz

## 2018-10-20 LAB — HEMOGLOBIN A1C
Hgb A1c MFr Bld: 11.4 % — ABNORMAL HIGH (ref 4.8–5.6)
Mean Plasma Glucose: 280.48 mg/dL

## 2018-10-20 LAB — LIPID PANEL
Cholesterol: 320 mg/dL — ABNORMAL HIGH (ref 0–200)
HDL: 30 mg/dL — ABNORMAL LOW (ref >40)
LDL Cholesterol: UNDETERMINED mg/dL (ref 0–99)
Total CHOL/HDL Ratio: 10.7 RATIO
Triglycerides: 816 mg/dL — ABNORMAL HIGH (ref <150)
VLDL: UNDETERMINED mg/dL (ref 0–40)

## 2018-10-20 LAB — LDL CHOLESTEROL, DIRECT: Direct LDL: 141.9 mg/dL — ABNORMAL HIGH (ref 0–99)

## 2018-10-20 LAB — GLUCOSE, CAPILLARY
Glucose-Capillary: 389 mg/dL — ABNORMAL HIGH (ref 70–99)
Glucose-Capillary: 137 mg/dL — ABNORMAL HIGH (ref 70–99)

## 2018-10-20 LAB — TSH: TSH: 3.336 u[IU]/mL (ref 0.350–4.500)

## 2018-10-20 MED ORDER — EQ ASPIRIN ADULT LOW DOSE 81 MG PO TBEC: 81.0000 mg | DELAYED_RELEASE_TABLET | Freq: Every day | ORAL | 0 refills | Status: AC

## 2018-10-20 MED ORDER — CLOPIDOGREL BISULFATE 75 MG PO TABS: 75.0000 mg | ORAL_TABLET | Freq: Every day | ORAL | 0 refills | Status: DC

## 2018-10-20 NOTE — Progress Notes (Signed)
STROKE TEAM PROGRESS NOTE   HISTORY OF PRESENT ILLNESS (per record) Vincent [name] is a 53 y.o. male with a history of tobacco abuse hypertension, hyperlipidemia, TIA, diabetes, chronic back pain who presents with left-sided numbness starting on awakening this morning.  Does report some worsening since onset of symptoms.  He states that once he was unable to walk, he decided come to seek further care in the emergency department. He endorses symptoms in the face, arm, leg.  He denies any visual change. He also describes that while driving, he became severely lightheaded and pulled off to the side of the road.  He is also having some chest heaviness.  LKW: 10:30 PM tpa given?: no, outside window   INTERVAL HISTORY His family is not at bedside, he is feeling better symptoms resolved. Discussed TIA, management of his uncontrolled risk factors, states he was on asa daily at home and never misses hi smedications.    OBJECTIVE Vitals:   10/19/18 1830 10/19/18 2032 10/20/18 0300 10/20/18 1231  BP: 139/68 124/85  126/63  Pulse:  79 80 (!) 102  Resp: 11 18  18   Temp:  98.1 F (36.7 C) 97.9 F (36.6 C) 98.5 F (36.9 C)  TempSrc:  Oral Oral Oral  SpO2:  100% 100% 100%  Weight:  94.3 kg 95 kg   Height:  6' (1.829 m)      CBC:  Recent Labs  Lab 10/19/18 1135 10/19/18 1202  WBC  --  8.8  NEUTROABS  --  5.9  HGB 17.0 17.1*  HCT 50.0 48.6  MCV  --  84.1  PLT  --  782    Basic Metabolic Panel:  Recent Labs  Lab 10/19/18 1127 10/19/18 1135 10/19/18 1815  NA 128* 128* 135  K 4.8 5.0 3.9  CL 94* 98 103  CO2 19*  --  22  GLUCOSE 679* 668* 272*  BUN 15 18 12   CREATININE 1.29* 1.10 1.02  CALCIUM 9.5  --  9.0    Lipid Panel:     Component Value Date/Time   CHOL 320 (H) 10/20/2018 0403   CHOL 337 (H) 12/18/2017 1000   TRIG 816 (H) 10/20/2018 0403   HDL 30 (L) 10/20/2018 0403   HDL 23 (L) 12/18/2017 1000   CHOLHDL 10.7 10/20/2018 0403   VLDL UNABLE TO CALCULATE IF  TRIGLYCERIDE OVER 400 mg/dL 10/20/2018 0403   LDLCALC UNABLE TO CALCULATE IF TRIGLYCERIDE OVER 400 mg/dL 10/20/2018 0403   LDLCALC Comment 12/18/2017 1000   HgbA1c:  Lab Results  Component Value Date   HGBA1C 11.4 (H) 10/20/2018   Urine Drug Screen:     Component Value Date/Time   LABOPIA NONE DETECTED 10/19/2018 1300   COCAINSCRNUR NONE DETECTED 10/19/2018 1300   COCAINSCRNUR NONE DETECTED 05/06/2016 0019   LABBENZ NONE DETECTED 10/19/2018 1300   AMPHETMU NONE DETECTED 10/19/2018 1300   THCU NONE DETECTED 10/19/2018 1300   LABBARB NONE DETECTED 10/19/2018 1300    Alcohol Level     Component Value Date/Time   ETH <10 10/19/2018 1127    IMAGING  Ct Angio Head W Or Wo Contrast Ct Angio Neck W And/or Wo Contrast 10/19/2018 IMPRESSION:  1. 50% diameter stenosis proximal right internal carotid artery. Left carotid with mild atherosclerotic disease but no significant stenosis. Both vertebral arteries patent in the neck.  2. Small intracranial circulation diffusely due to small vessels and superimposed atherosclerotic disease. Mild irregularity in the anterior and posterior circulation. No critical stenosis or large vessel  occlusion.  3. No acute intracranial abnormality.   Mr Brain Wo Contrast 10/20/2018 IMPRESSION:  Normal brain MRI.    Dg Chest Portable 1 View 10/19/2018 IMPRESSION:  No active disease.   Ct Angio Chest Aorta W/cm &/or Wo/cm 10/19/2018 IMPRESSION:  1. No aneurysm or dissection in the thoracic aorta or branching vessels. Atherosclerotic changes are seen in the aorta and at the origin of the branching vessels without significant stenosis.  2. Coronary artery calcifications on the right. 3. Aortic Atherosclerosis   Transthoracic Echocardiogram  00/00/2020  Recent Results (from the past 43800 hour(s))  ECHOCARDIOGRAM COMPLETE   Collection Time: 10/20/18 11:42 AM  Result Value   Weight 3,350.99   Height 72   BP 124/85   Narrative     ECHOCARDIOGRAM  REPORT       Patient Name:   Vincent [name] [facility] Date of Exam: 10/20/2018 Medical Rec #:  [id number]       Height:       72.0 in Accession #:    [id number]      Weight:       209.4 lb Date of Birth:  1965-07-07        BSA:          2.17 m Patient Age:    66 years        BP:           00/00 mmHg Patient Gender: M               HR:           96 bpm. Exam Location:  Inpatient    Procedure: 2D Echo, Cardiac Doppler and Color Doppler  Indications:    Stroke 434.91 / I163.9   History:        Patient has no prior history of Echocardiogram examinations.                 Coronary Artery Disease, Rheumatoid Arthritis, Hypothyroidism,                 Chest Pain, Hyperlipidemia, Obesity, Diabetes II.   Sonographer:    Madelaine Etienne RDCS (AE) Referring Phys: Ramos    1. The left ventricle has normal systolic function, with an ejection fraction of 55-60%. The cavity size was normal. There is mildly increased left ventricular wall thickness. Left ventricular diastolic parameters were normal.  2. The right ventricle has normal systolic function. The cavity was normal. There is no increase in right ventricular wall thickness.  3. Mild thickening of the mitral valve leaflet.  4. The aortic valve is tricuspid. Mild thickening of the aortic valve.  5. The aorta is normal in size and structure.  6. The interatrial septum was not well visualized.  FINDINGS  Left Ventricle: The left ventricle has normal systolic function, with an ejection fraction of 55-60%. The cavity size was normal. There is mildly increased left ventricular wall thickness. Left ventricular diastolic parameters were normal.  Right Ventricle: The right ventricle has normal systolic function. The cavity was normal. There is no increase in right ventricular wall thickness.  Left Atrium: Left atrial size was normal in size.  Right Atrium: Right atrial size was normal in size. Right atrial pressure is estimated at  10 mmHg.  Interatrial Septum: The interatrial septum was not well visualized.  Pericardium: There is no evidence of pericardial effusion.  Mitral Valve: The mitral valve is normal in structure. Mild thickening of the mitral valve  leaflet. Mitral valve regurgitation is trivial by color flow Doppler.  Tricuspid Valve: The tricuspid valve is normal in structure. Tricuspid valve regurgitation is trivial by color flow Doppler.  Aortic Valve: The aortic valve is tricuspid Mild thickening of the aortic valve. Aortic valve regurgitation was not visualized by color flow Doppler. There is no evidence of aortic valve stenosis.  Pulmonic Valve: The pulmonic valve was grossly normal. Pulmonic valve regurgitation is trivial by color flow Doppler.  Aorta: The aorta is normal in size and structure.  Venous: The inferior vena cava is normal in size with greater than 50% respiratory variability.    +--------------+--------++ LEFT VENTRICLE              +----------------+---------++ +--------------+--------++      Diastology                PLAX 2D                     +----------------+---------++ +--------------+--------++      LV e' lateral:  8.81 cm/s LVIDd:        3.66 cm       +----------------+---------++ +--------------+--------++      LV E/e' lateral:5.8       LVIDs:        2.37 cm       +----------------+---------++ +--------------+--------++ LV PW:        1.15 cm  +--------------+--------++ LV IVS:       1.19 cm  +--------------+--------++ LVOT diam:    2.20 cm  +--------------+--------++ LV SV:        37 ml    +--------------+--------++ LV SV Index:  16.75    +--------------+--------++ LVOT Area:    3.80 cm +--------------+--------++                        +--------------+--------++   +------------------+---------++ LV Volumes (MOD)            +------------------+---------++ LV area d, A2C:   23.30  cm +------------------+---------++ LV area d, A4C:   31.00 cm +------------------+---------++ LV area s, A2C:   14.90 cm +------------------+---------++ LV area s, A4C:   18.40 cm +------------------+---------++ LV major d, A2C:  7.58 cm   +------------------+---------++ LV major d, A4C:  8.57 cm   +------------------+---------++ LV major s, A2C:  6.95 cm   +------------------+---------++ LV major s, A4C:  7.16 cm   +------------------+---------++ LV vol d, MOD A2C:62.9 ml   +------------------+---------++ LV vol d, MOD A4C:92.9 ml   +------------------+---------++ LV vol s, MOD A2C:28.1 ml   +------------------+---------++ LV vol s, MOD A4C:40.6 ml   +------------------+---------++ LV SV MOD A2C:    34.8 ml   +------------------+---------++ LV SV MOD A4C:    92.9 ml   +------------------+---------++ LV SV MOD BP:     46.6 ml   +------------------+---------++  +---------------+----------++ RIGHT VENTRICLE           +---------------+----------++ RV S prime:    11.20 cm/s +---------------+----------++ TAPSE (M-mode):2.4 cm     +---------------+----------++  +---------------+-------++-----------++ LEFT ATRIUM           Index       +---------------+-------++-----------++ LA diam:       3.20 cm1.47 cm/m  +---------------+-------++-----------++ LA Vol (A2C):  31.1 ml14.31 ml/m +---------------+-------++-----------++ LA Vol (A4C):  17.6 ml8.10 ml/m  +---------------+-------++-----------++ LA Biplane Vol:25.0 ml11.51 ml/m +---------------+-------++-----------++ +------------+--------++----------++ RIGHT ATRIUM        Index      +------------+--------++----------++ RA Area:  8.33 cm           +------------+--------++----------++ RA Volume:  16.30 ml7.50 ml/m +------------+--------++----------++  +------------------+-----------++ AORTIC  VALVE                  +------------------+-----------++ AV Area (Vmax):   3.32 cm    +------------------+-----------++ AV Area (Vmean):  3.06 cm    +------------------+-----------++ AV Area (VTI):    2.93 cm    +------------------+-----------++ AV Vmax:          110.00 cm/s +------------------+-----------++ AV Vmean:         76.300 cm/s +------------------+-----------++ AV VTI:           0.174 m     +------------------+-----------++ AV Peak Grad:     4.8 mmHg    +------------------+-----------++ AV Mean Grad:     3.0 mmHg    +------------------+-----------++ LVOT Vmax:        96.00 cm/s  +------------------+-----------++ LVOT Vmean:       61.400 cm/s +------------------+-----------++ LVOT VTI:         0.134 m     +------------------+-----------++ LVOT/AV VTI ratio:0.77        +------------------+-----------++   +-------------+-------++ AORTA                +-------------+-------++ Ao Root diam:2.50 cm +-------------+-------++ Ao Asc diam: 3.30 cm +-------------+-------++  +--------------+----------++ +---------------+----------++ MITRAL VALVE             TRICUSPID VALVE           +--------------+----------++ +---------------+----------++ MV Area (PHT):2.96 cm   TR Peak grad:  3.4 mmHg   +--------------+----------++ +---------------+----------++ MV PHT:       74.24 msec TR Vmax:       92.30 cm/s +--------------+----------++ +---------------+----------++ MV Decel Time:256 msec   +--------------+----------++ +--------------+-------+ +--------------+----------++ SHUNTS                MV E velocity:51.40 cm/s +--------------+-------+ +--------------+----------++ Systemic VTI: 0.13 m  MV A velocity:60.80 cm/s +--------------+-------+ +--------------+----------++ Systemic Diam:2.20 cm MV E/A ratio: 0.85        +--------------+-------+ +--------------+----------++    Jenkins Rouge MD Electronically signed by Jenkins Rouge MD Signature Date/Time: 10/20/2018/12:05:48 PM       Final     *Note: Due to a large number of results and/or encounters for the requested time period, some results have not been displayed. A complete set of results can be found in Results Review.     ECG - SR rate 90 BPM. (See cardiology reading for complete details)  EEG - not ordered   PHYSICAL EXAM Blood pressure 126/63, pulse (!) 102, temperature 98.5 F (36.9 C), temperature source Oral, resp. rate 18, height 6' (1.829 m), weight 95 kg, SpO2 100 %.   Exam: NAD, pleasant                  Speech:    Speech is normal; fluent and spontaneous with normal comprehension.  Cognition:    The patient is oriented to person, place, and time;     recent and remote memory intact;     language fluent;    Cranial Nerves:    The pupils are equal, round, and reactive to light.Trigeminal sensation is intact and the muscles of mastication are normal. The face is symmetric. The palate elevates in the midline. Hearing intact. Voice is normal. Shoulder shrug is normal. The tongue has normal motion without fasciculations.   Coordination:  No dysmetria  Motor Observation:    No asymmetry,  no atrophy Tone:    Normal muscle tone.     Strength:    Strength is V/V in the upper and lower limbs.      Sensation: intact to LT  ASSESSMENT/PLAN Vincent [name] is a 53 y.o. male with history of tobacco abuse hypertension, hyperlipidemia, hypothyroidism, COPD, Bipolar disorder, TIA, diabetes, PTSD, hx of possible seizures, OSA, RA, and chronic back pain presenting with left sided numbness / weakness, transiently light headed and chest heaviness.  He did not receive IV t-PA due to late presentation (>4.5 hours from time of onset).  Possible TIA:    Code Stroke CT Head - not ordered  MRI head - Normal brain MRI.    MRA head - not  ordered  CTA H&N - 50% diameter stenosis proximal right internal carotid artery. Left carotid with mild atherosclerotic disease but no significant stenosis. Both vertebral arteries patent in the neck. Small intracranial circulation diffusely due to small vessels and superimposed atherosclerotic disease. Mild irregularity in the anterior and posterior circulation. No critical stenosis or large vessel occlusion.  CT Perfusion - not performed  Carotid Doppler - CTA neck performed - carotid dopplers not indicated.  2D Echo - no pfo no thrombus   [employer] Virus 2 - negative  LDL - unable to calculate due to elevated triglycerides > 400  HgbA1c - 11.4  UDS - negative  VTE prophylaxis - Lovenox Diet  Diet Order            Diet Carb Modified Fluid consistency: Thin; Room service appropriate? Yes  Diet effective now              aspirin 81 mg daily prior to admission, now on aspirin 325 mg daily and Plavix 75 mg daily (DUAP for 3 weeks then Plavix alone)  Patient counseled to be compliant with his antithrombotic medications  Ongoing aggressive stroke risk factor management  Therapy recommendations:  pending  Disposition:  Pending  Hypertension  Home BP meds: Metoprolol 25 mg Bid  Current BP meds: none  Stable . Permissive hypertension (OK if < 220/120) - since MRi neg can restart BP medications to normalize BP . Long-term BP goal normotensive  Hyperlipidemia  Home Lipid lowering medication:  Lipitor 40 mg daily  LDL unable to calculate due to elevated triglycerides > 400, goal < 70  Current lipid lowering medication: Lipitor 40 mg daily and fenofibrate 160 mg daily  Continue statin at discharge  Diabetes  Home diabetic meds: Trulicity ; Metformin ; Humalog insulin ; Gargine insulin  Current diabetic meds: insulin  HgbA1c 11.4, goal < 7.0  CBGs: 389  Other Stroke Risk Factors  Cigarette smoker - advised to stop smoking  Obesity, Body mass index is  28.4 kg/m., recommend weight loss, diet and exercise as appropriate   TIA history  OSA no Cpap, discussed sequelae of untreated sleep apnea including stroke   Other Active Problems  Hx of possible seizures Dr Krista Blue - consider EEG  F/u with [name] at [facility], neurology  Stroke will sign off at this time    Hospital day # 1  I had a long d/w patient about his TIA, risk for recurrent stroke/TIAs, personally independently reviewed imaging studies and stroke evaluation results and answered questions.Continue Plavix and asa for 3 weeks then plavix alone for stroke prevention and maintain strict control of hypertension with blood pressure goal below 130/90, diabetes with hemoglobin A1c goal below 6.5% and lipids with LDL cholesterol goal below 70  mg/dL. I also advised the patient to eat a healthy diet with plenty of whole grains, cereals, fruits and vegetables, exercise regularly and maintain ideal body weight .No smoking.  Personally examined patient and images, and have participated in and made any corrections needed to history, physical, neuro exam,assessment and plan as stated above.  I have personally obtained the history, evaluated lab date, reviewed imaging studies and agree with radiology interpretations.    Sarina Ill, MD Stroke Neurology   A total of 35 minutes was spent for the care of this patient, spent on counseling patient and family on different diagnostic and therapeutic options, counseling and coordination of care, riskd ans benefits of management, compliance, or risk factor reduction and education.   To contact Stroke Continuity provider, please refer to [email or web address] After hours, contact General Neurology

## 2018-10-20 NOTE — TOC Transition Note (Signed)
Transition of Care [facility]) - CM/SW Discharge Note   Patient Details  Name: Vincent [name] MRN: [id number] Date of Birth: 1966-02-25  Transition of Care [facility]) CM/SW Contact:  Claudie Leach, RN Phone Number: 10/20/2018, 5:55 PM   Clinical Narrative:    Discussed d/c plan with patient and wife.  Patient has used [facility] before and would like to use again for HHPT.  No DME necessary.  Patient has a cane and walker.     Final next level of care: [place] Barriers to Discharge: No Barriers Identified   Patient Goals and CMS Choice Patient states their goals for this hospitalization and ongoing recovery are:: to get home CMS Medicare.gov Compare Post Acute Care list provided to:: Patient Choice offered to / list presented to : Patient   Discharge Plan and Services    HH Arranged: PT [facility] Agency: [place] Date [place]: 10/20/18 Time [place]: [id number] Representative spoke with at [place]: Tommi Rumps

## 2018-10-20 NOTE — Discharge Summary (Signed)
Physician Discharge Summary  Vincent [name] JKK:[id number] DOB: 1965-12-17 DOA: 10/19/2018  PCP: Vincent Rasmussen, MD  Admit date: 10/19/2018 Discharge date: 10/20/2018  Admitted From: Home Disposition: Home  Recommendations for Outpatient Follow-up:  1. Follow up with PCP in 1 week 2. Follow up with Neurologist 3. Please obtain BMP/CBC in one week 4. Please follow up on the following pending results: None  Home Health: PT Equipment/Devices: None  Discharge Condition: Stable CODE STATUS: Full code Diet recommendation: Heart healthy/carb modified   Brief/Interim Summary:  Admission HPI written by Vincent Mainland, DO   HPI: Vincent [name] is a 53 y.o. male with a history of uncontrolled type 2 diabetes, coronary artery disease (nonobstructive on CTA in October 2019), bipolar disorder, rheumatoid arthritis on chronic opioid management, COPD, GERD, history of bladder cancer, hypertension, hypothyroidism, seizure disorder.  Patient seen for onset of left-sided weakness with chest heaviness that started earlier today.  Symptoms started early this morning and worsened as the day progressed.  He came to the hospital to be evaluated.  No palliating or provoking factors.  His chest heaviness is improving.  His blood sugar has been high for the past couple of days and he states that he has been taking his insulin as directed.    Hospital course:  TIA Per neurology assessment. MRI negative for stroke. CTA neck significant for 50% ICA stenosis. Transthoracic Echocardiogram without evidence of embolic source. Hemoglobin A1C of 11.4%. PT recommending home health PT. Neurology recommending aspirin x3 weeks and Plavix indefinitely. Outpatient neurology follow-up.  Chest heaviness Unknown etiology. No evidence of ACS. Resolved. Possibly related to above problem. CTA chest significant for atherosclerotic disease. Patient is on aspirin, plavix and statin. Outpatient follow-up.  Uncontrolled  diabetes type 2 Hyperosmolar non-ketotic hyperglycemia Initially given insulin drip and transitioned to home insulin with resolution  Hypothyroidism Continue Synthroid  Rheumatoid arthritis Continue Duragesic patch   Discharge Diagnoses:  Principal Problem:   Left sided numbness Active Problems:   Hypothyroidism   Bipolar 2 disorder (HCC)   History of rheumatoid arthritis   Seizure disorder ([place])   Uncontrolled type 2 DM with hyperosmolar nonketotic hyperglycemia ([place])    Discharge Instructions  Discharge Instructions    Increase activity slowly   Complete by: As directed      Allergies as of 10/20/2018      Reactions   Celebrex [celecoxib] Anaphylaxis, Rash   Hydrocodone Rash, Other (See Comments)   "blisters developed on arms"   Sulfa Antibiotics Rash      Medication List    STOP taking these medications   Oxycodone HCl 10 MG Tabs   pregabalin 150 MG capsule Commonly known as: LYRICA     TAKE these medications   ARIPiprazole 5 MG tablet Commonly known as: ABILIFY Take 1 tablet (5 mg total) by mouth daily.   atorvastatin 40 MG tablet Commonly known as: LIPITOR Take 1 tablet (40 mg total) by mouth daily at 6 PM.   Basaglar KwikPen 100 UNIT/ML Sopn Inject 70 Units into the skin 2 (two) times daily.   busPIRone 5 MG tablet Commonly known as: BUSPAR Take 1 tablet (5 mg total) by mouth 2 (two) times daily.   clopidogrel 75 MG tablet Commonly known as: PLAVIX Take 1 tablet (75 mg total) by mouth daily. Start taking on: October 21, 2018   cyclobenzaprine 10 MG tablet Commonly known as: FLEXERIL Take 1 tablet by mouth 3 times daily as needed for muscle spasm. Warning: May  cause drowsiness.   EQ Aspirin Adult Low Dose 81 MG EC tablet Generic drug: aspirin Take 1 tablet (81 mg total) by mouth daily for 20 days.   fenofibrate 160 MG tablet Take 1 tablet (160 mg total) by mouth daily.   fentaNYL 75 MCG/HR Commonly known as: DURAGESIC Place 75 mcg  onto the skin every 3 (three) days.   gabapentin 600 MG tablet Commonly known as: NEURONTIN Take 600 mg by mouth 2 (two) times daily.   hydroxychloroquine 200 MG tablet Commonly known as: PLAQUENIL Take 200 mg by mouth 2 (two) times daily.   insulin lispro 100 UNIT/ML injection Commonly known as: HUMALOG Inject 40 Units into the skin 3 (three) times daily before meals.   isosorbide mononitrate 120 MG 24 hr tablet Commonly known as: IMDUR Take 1 tablet (120 mg total) by mouth daily. What changed: when to take this   lamoTRIgine 150 MG tablet Commonly known as: LAMICTAL Take 2 tablets (300 mg total) by mouth every morning.   levothyroxine 25 MCG tablet Commonly known as: SYNTHROID Take 25 mcg by mouth daily before breakfast.   metFORMIN 500 MG 24 hr tablet Commonly known as: GLUCOPHAGE-XR Take 500 mg by mouth 2 (two) times daily.   metoprolol tartrate 25 MG tablet Commonly known as: LOPRESSOR Take 1 tablet (25 mg total) by mouth 2 (two) times daily.   mirtazapine 30 MG tablet Commonly known as: [name] Take 1 tablet (30 mg total) by mouth at bedtime.   mupirocin ointment 2 % Commonly known as: BACTROBAN Apply 1 application topically 2 (two) times daily. Apply to the affected area 2 times a day   omeprazole 20 MG capsule Commonly known as: PRILOSEC Take 20 mg by mouth every morning.   ondansetron 4 MG disintegrating tablet Commonly known as: ZOFRAN-ODT Take 1 tablet (4 mg total) by mouth every 8 (eight) hours as needed for nausea or vomiting.   PrePLUS 27-1 MG Tabs Take 1 tablet by mouth daily.   testosterone cypionate 200 MG/ML injection Commonly known as: DEPOTESTOSTERONE CYPIONATE Inject 1 mL into the muscle every 14 (fourteen) days.   Trulicity 1.01 BP/1.0CH Sopn Generic drug: Dulaglutide Inject 1 pen into the skin once a week.   venlafaxine XR 150 MG 24 hr capsule Commonly known as: EFFEXOR-XR Take 2 capsules (300 mg total) by mouth daily with  breakfast.   Vitamin D3 50 MCG (2000 UT) Tabs TAKE 1 TABLET BY MOUTH ONCE DAILY What changed: how much to take      Follow-up Information    Vincent Pacas, MD. Schedule an appointment as soon as possible for a visit in 1 week(s).   Specialty: Neurology Why: TIA Contact information: [address] [place] [address] [phone]          Allergies  Allergen Reactions   Celebrex [Celecoxib] Anaphylaxis and Rash   Hydrocodone Rash and Other (See Comments)    "blisters developed on arms"   Sulfa Antibiotics Rash    Consultations:  Neurology   Procedures/Studies: Ct Angio Head W Or Wo Contrast  Result Date: 10/19/2018 CLINICAL DATA:  TIA, left-sided numbness EXAM: CT ANGIOGRAPHY HEAD AND NECK TECHNIQUE: Multidetector CT imaging of the head and neck was performed using the standard protocol during bolus administration of intravenous contrast. Multiplanar CT image reconstructions and MIPs were obtained to evaluate the vascular anatomy. Carotid stenosis measurements (when applicable) are obtained utilizing NASCET criteria, using the distal internal carotid diameter as the denominator. CONTRAST:  173mL OMNIPAQUE IOHEXOL 350  MG/ML SOLN COMPARISON:  CT head 05/16/2017 FINDINGS: CT HEAD FINDINGS Brain: No evidence of acute infarction, hemorrhage, hydrocephalus, extra-axial collection or mass lesion/mass effect. Vascular: Negative for hyperdense vessel Skull: Negative Sinuses: Negative Orbits: Negative Review of the MIP images confirms the above findings CTA NECK FINDINGS Aortic arch: Standard branching. Imaged portion shows no evidence of aneurysm or dissection. No significant stenosis of the major arch vessel origins. Right carotid system: Mild atherosclerotic disease right common carotid artery. Atherosclerotic calcification proximal right internal carotid artery narrowing the lumen to 50% diameter stenosis. Left carotid system: Left common carotid artery widely patent with mild  atherosclerotic disease. Mild atherosclerotic calcification proximal left internal carotid artery without significant stenosis. Vertebral arteries: Both vertebral arteries are widely patent without stenosis. Right vertebral artery dominant. Skeleton: No acute abnormality. Other neck: Negative for mass or adenopathy. Upper chest: Lung apices clear bilaterally. Review of the MIP images confirms the above findings CTA HEAD FINDINGS Anterior circulation: Atherosclerotic disease in the cavernous carotid bilaterally with mild narrowing bilaterally. Anterior and middle cerebral arteries are patent bilaterally with mild irregularity. Intracranial circulation is diffusely of small caliber. Some of this is due to atherosclerotic disease and some is congenital. Posterior circulation: Both vertebral arteries patent to the basilar. PICA patent bilaterally. Basilar is small but patent. Superior cerebellar arteries patent bilaterally. Posterior cerebral arteries are small and irregular but without critical stenosis. Venous sinuses: Normal enhancement Anatomic variants: None Review of the MIP images confirms the above findings IMPRESSION: 1. 50% diameter stenosis proximal right internal carotid artery. Left carotid with mild atherosclerotic disease but no significant stenosis. Both vertebral arteries patent in the neck. 2. Small intracranial circulation diffusely due to small vessels and superimposed atherosclerotic disease. Mild irregularity in the anterior and posterior circulation. No critical stenosis or large vessel occlusion. 3. No acute intracranial abnormality. Electronically Signed   By: Franchot Gallo M.D.   On: 10/19/2018 12:51   Ct Angio Neck W And/or Wo Contrast  Result Date: 10/19/2018 CLINICAL DATA:  TIA, left-sided numbness EXAM: CT ANGIOGRAPHY HEAD AND NECK TECHNIQUE: Multidetector CT imaging of the head and neck was performed using the standard protocol during bolus administration of intravenous contrast.  Multiplanar CT image reconstructions and MIPs were obtained to evaluate the vascular anatomy. Carotid stenosis measurements (when applicable) are obtained utilizing NASCET criteria, using the distal internal carotid diameter as the denominator. CONTRAST:  145mL OMNIPAQUE IOHEXOL 350 MG/ML SOLN COMPARISON:  CT head 05/16/2017 FINDINGS: CT HEAD FINDINGS Brain: No evidence of acute infarction, hemorrhage, hydrocephalus, extra-axial collection or mass lesion/mass effect. Vascular: Negative for hyperdense vessel Skull: Negative Sinuses: Negative Orbits: Negative Review of the MIP images confirms the above findings CTA NECK FINDINGS Aortic arch: Standard branching. Imaged portion shows no evidence of aneurysm or dissection. No significant stenosis of the major arch vessel origins. Right carotid system: Mild atherosclerotic disease right common carotid artery. Atherosclerotic calcification proximal right internal carotid artery narrowing the lumen to 50% diameter stenosis. Left carotid system: Left common carotid artery widely patent with mild atherosclerotic disease. Mild atherosclerotic calcification proximal left internal carotid artery without significant stenosis. Vertebral arteries: Both vertebral arteries are widely patent without stenosis. Right vertebral artery dominant. Skeleton: No acute abnormality. Other neck: Negative for mass or adenopathy. Upper chest: Lung apices clear bilaterally. Review of the MIP images confirms the above findings CTA HEAD FINDINGS Anterior circulation: Atherosclerotic disease in the cavernous carotid bilaterally with mild narrowing bilaterally. Anterior and middle cerebral arteries are patent bilaterally with mild irregularity. Intracranial circulation  is diffusely of small caliber. Some of this is due to atherosclerotic disease and some is congenital. Posterior circulation: Both vertebral arteries patent to the basilar. PICA patent bilaterally. Basilar is small but patent. Superior  cerebellar arteries patent bilaterally. Posterior cerebral arteries are small and irregular but without critical stenosis. Venous sinuses: Normal enhancement Anatomic variants: None Review of the MIP images confirms the above findings IMPRESSION: 1. 50% diameter stenosis proximal right internal carotid artery. Left carotid with mild atherosclerotic disease but no significant stenosis. Both vertebral arteries patent in the neck. 2. Small intracranial circulation diffusely due to small vessels and superimposed atherosclerotic disease. Mild irregularity in the anterior and posterior circulation. No critical stenosis or large vessel occlusion. 3. No acute intracranial abnormality. Electronically Signed   By: Franchot Gallo M.D.   On: 10/19/2018 12:51   Mr Brain Wo Contrast  Result Date: 10/20/2018 CLINICAL DATA:  Numbness and tingling EXAM: MRI HEAD WITHOUT CONTRAST TECHNIQUE: Multiplanar, multiecho pulse sequences of the brain and surrounding structures were obtained without intravenous contrast. COMPARISON:  05/28/2015 FINDINGS: BRAIN: There is no acute infarct, acute hemorrhage or extra-axial collection. The white matter signal is normal for the patient's age. The cerebral and cerebellar volume are age-appropriate. There is no hydrocephalus. The midline structures are normal. VASCULAR: Susceptibility-sensitive sequences show no chronic microhemorrhage or superficial siderosis. The major intracranial arterial and venous sinus flow voids are normal. SKULL AND UPPER CERVICAL SPINE: Calvarial bone marrow signal is normal. There is no skull base mass. The visualized upper cervical spine and soft tissues are normal. SINUSES/ORBITS: There are no fluid levels or advanced mucosal thickening. The mastoid air cells and middle ear cavities are free of fluid. The orbits are normal. IMPRESSION: Normal brain MRI. Electronically Signed   By: Ulyses Jarred M.D.   On: 10/20/2018 03:10   Dg Chest Portable 1 View  Result Date:  10/19/2018 CLINICAL DATA:  Acute chest pain today. EXAM: PORTABLE CHEST 1 VIEW COMPARISON:  01/01/2018 and prior chest radiographs FINDINGS: The cardiomediastinal silhouette is unremarkable. There is no evidence of focal airspace disease, pulmonary edema, suspicious pulmonary nodule/mass, pleural effusion, or pneumothorax. No acute bony abnormalities are identified. IMPRESSION: No active disease. Electronically Signed   By: Margarette Canada M.D.   On: 10/19/2018 11:51   Ct Angio Chest Aorta W/cm &/or Wo/cm  Result Date: 10/19/2018 CLINICAL DATA:  Patient woke up with left-sided arm weakness that is progressively worsened. Left-sided chest tightness and discomfort. EXAM: CT ANGIOGRAPHY CHEST WITH CONTRAST TECHNIQUE: Multidetector CT imaging of the chest was performed using the standard protocol during bolus administration of intravenous contrast. Multiplanar CT image reconstructions and MIPs were obtained to evaluate the vascular anatomy. CONTRAST:  174mL OMNIPAQUE IOHEXOL 350 MG/ML SOLN COMPARISON:  Chest x-ray October 19, 2018. CT of the chest October 13, 2010. FINDINGS: Cardiovascular: Atherosclerosis is seen in the right coronary artery. The heart is normal in appearance. The central pulmonary arteries are normal. Mild atherosclerotic changes are seen in the thoracic aorta. No aneurysm is identified. No dissection is identified in the thoracic aorta. Mild atherosclerotic changes are seen at the origins of the great vessels off of the aortic arch. There is no significant narrowing of any of these vessels. The most significant atherosclerosis is at the origin of the left carotid artery as seen on sagittal image 104 but the origin remains widely patent. The carotid arteries and subclavian arteries are otherwise normal. The vertebral arteries are patent. Mediastinum/Nodes: No enlarged mediastinal, hilar, or axillary lymph nodes. Thyroid  gland, trachea, and esophagus demonstrate no significant findings. Lungs/Pleura: Lungs  are clear. No pleural effusion or pneumothorax. Upper Abdomen: Patient is status post cholecystectomy. Low-attenuation in the posterior aspect of both kidneys is likely due to streak artifact which limits evaluation. No other abnormalities are seen in the upper abdomen. Musculoskeletal: No chest wall abnormality. No acute or significant osseous findings. Review of the MIP images confirms the above findings. IMPRESSION: 1. No aneurysm or dissection in the thoracic aorta or branching vessels. Atherosclerotic changes are seen in the aorta and at the origin of the branching vessels without significant stenosis. 2. Coronary artery calcifications on the right. Aortic Atherosclerosis (ICD10-I70.0). Electronically Signed   By: Dorise Bullion III M.D   On: 10/19/2018 12:45     Transthoracic Echocardiogram (10/20/2018) IMPRESSIONS    1. The left ventricle has normal systolic function, with an ejection fraction of 55-60%. The cavity size was normal. There is mildly increased left ventricular wall thickness. Left ventricular diastolic parameters were normal.  2. The right ventricle has normal systolic function. The cavity was normal. There is no increase in right ventricular wall thickness.  3. Mild thickening of the mitral valve leaflet.  4. The aortic valve is tricuspid. Mild thickening of the aortic valve.  5. The aorta is normal in size and structure.  6. The interatrial septum was not well visualized.  FINDINGS  Left Ventricle: The left ventricle has normal systolic function, with an ejection fraction of 55-60%. The cavity size was normal. There is mildly increased left ventricular wall thickness. Left ventricular diastolic parameters were normal.  Right Ventricle: The right ventricle has normal systolic function. The cavity was normal. There is no increase in right ventricular wall thickness.  Left Atrium: Left atrial size was normal in size.  Right Atrium: Right atrial size was normal in size.  Right atrial pressure is estimated at 10 mmHg.  Interatrial Septum: The interatrial septum was not well visualized.  Pericardium: There is no evidence of pericardial effusion.  Mitral Valve: The mitral valve is normal in structure. Mild thickening of the mitral valve leaflet. Mitral valve regurgitation is trivial by color flow Doppler.  Tricuspid Valve: The tricuspid valve is normal in structure. Tricuspid valve regurgitation is trivial by color flow Doppler.  Aortic Valve: The aortic valve is tricuspid Mild thickening of the aortic valve. Aortic valve regurgitation was not visualized by color flow Doppler. There is no evidence of aortic valve stenosis.  Pulmonic Valve: The pulmonic valve was grossly normal. Pulmonic valve regurgitation is trivial by color flow Doppler.  Aorta: The aorta is normal in size and structure.  Venous: The inferior vena cava is normal in size with greater than 50% respiratory variability.  Subjective: No issues this morning other than continued left sided numbness with some weakness especially when ambulating with PT earlier.  Discharge Exam: Vitals:   10/20/18 0300 10/20/18 1231  BP:  126/63  Pulse: 80 (!) 102  Resp:  18  Temp: 97.9 F (36.6 C) 98.5 F (36.9 C)  SpO2: 100% 100%   Vitals:   10/19/18 1830 10/19/18 2032 10/20/18 0300 10/20/18 1231  BP: 139/68 124/85  126/63  Pulse:  79 80 (!) 102  Resp: 11 18  18   Temp:  98.1 F (36.7 C) 97.9 F (36.6 C) 98.5 F (36.9 C)  TempSrc:  Oral Oral Oral  SpO2:  100% 100% 100%  Weight:  94.3 kg 95 kg   Height:  6' (1.829 m)      General:  Pt is alert, awake, not in acute distress Cardiovascular: RRR, S1/S2 +, no rubs, no gallops Respiratory: CTA bilaterally, no wheezing, no rhonchi Abdominal: Soft, NT, ND, bowel sounds + Extremities: no edema, no cyanosis Neuro: left sided numbness from head down to legs. 4/5 strength of LLE    The results of significant diagnostics from this  hospitalization (including imaging, microbiology, ancillary and laboratory) are listed below for reference.     Microbiology: Recent Results (from the past 240 hour(s))  SARS Coronavirus 2 [facility] order, Performed in [facility] hospital lab) Nasopharyngeal Nasopharyngeal Swab     Status: None   Collection Time: 10/19/18 12:53 PM   Specimen: Nasopharyngeal Swab  Result Value Ref Range Status   SARS Coronavirus 2 NEGATIVE NEGATIVE Final    Comment: (NOTE) If result is NEGATIVE SARS-CoV-2 target nucleic acids are NOT DETECTED. The SARS-CoV-2 RNA is generally detectable in upper and lower  respiratory specimens during the acute phase of infection. The lowest  concentration of SARS-CoV-2 viral copies this assay can detect is 250  copies / mL. A negative result does not preclude SARS-CoV-2 infection  and should not be used as the sole basis for treatment or other  patient management decisions.  A negative result may occur with  improper specimen collection / handling, submission of specimen other  than nasopharyngeal swab, presence of viral mutation(s) within the  areas targeted by this assay, and inadequate number of viral copies  (<250 copies / mL). A negative result must be combined with clinical  observations, patient history, and epidemiological information. If result is POSITIVE SARS-CoV-2 target nucleic acids are DETECTED. The SARS-CoV-2 RNA is generally detectable in upper and lower  respiratory specimens dur ing the acute phase of infection.  Positive  results are indicative of active infection with SARS-CoV-2.  Clinical  correlation with patient history and other diagnostic information is  necessary to determine patient infection status.  Positive results do  not rule out bacterial infection or co-infection with other viruses. If result is PRESUMPTIVE POSTIVE SARS-CoV-2 nucleic acids MAY BE PRESENT.   A presumptive positive result was obtained on the submitted specimen  and  confirmed on repeat testing.  While 2019 novel coronavirus  (SARS-CoV-2) nucleic acids may be present in the submitted sample  additional confirmatory testing may be necessary for epidemiological  and / or clinical management purposes  to differentiate between  SARS-CoV-2 and other Sarbecovirus currently known to infect humans.  If clinically indicated additional testing with an alternate test  methodology [phone]) is advised. The SARS-CoV-2 RNA is generally  detectable in upper and lower respiratory sp ecimens during the acute  phase of infection. The expected result is Negative. Fact Sheet for Patients:  StrictlyIdeas.no Fact Sheet for Healthcare Providers: BankingDealers.co.za This test is not yet approved or cleared by the [place] FDA and has been authorized for detection and/or diagnosis of SARS-CoV-2 by FDA under an Emergency Use Authorization (EUA).  This EUA will remain in effect (meaning this test can be used) for the duration of the COVID-19 declaration under Section 564(b)(1) of the Act, 21 U.S.C. section 360bbb-3(b)(1), unless the authorization is terminated or revoked sooner. Performed at [facility], [place] [address], [place], [place] [address]      Labs: BNP (last 3 results) No results for input(s): BNP in the last 8760 hours. Basic Metabolic Panel: Recent Labs  Lab 10/19/18 1127 10/19/18 1135 10/19/18 1815  NA 128* 128* 135  K 4.8 5.0 3.9  CL 94*  98 103  CO2 19*  --  22  GLUCOSE 679* 668* 272*  BUN 15 18 12   CREATININE 1.29* 1.10 1.02  CALCIUM 9.5  --  9.0   Liver Function Tests: Recent Labs  Lab 10/19/18 1127  AST 16  ALT 19  ALKPHOS 65  BILITOT 0.9  PROT 6.8  ALBUMIN 4.2   No results for input(s): LIPASE, AMYLASE in the last 168 hours. No results for input(s): AMMONIA in the last 168 hours. CBC: Recent Labs  Lab 10/19/18 1135 10/19/18 1202  WBC  --  8.8  NEUTROABS  --  5.9  HGB  17.0 17.1*  HCT 50.0 48.6  MCV  --  84.1  PLT  --  243   Cardiac Enzymes: No results for input(s): CKTOTAL, CKMB, CKMBINDEX, TROPONINI in the last 168 hours. BNP: Invalid input(s): POCBNP CBG: Recent Labs  Lab 10/19/18 1753 10/19/18 1958 10/19/18 2114 10/20/18 0833 10/20/18 1100  GLUCAP 276* 245* 242* 389* 137*   D-Dimer No results for input(s): DDIMER in the last 72 hours. Hgb A1c Recent Labs    10/19/18 1202 10/20/18 0403  HGBA1C 11.3* 11.4*   Lipid Profile Recent Labs    10/20/18 0403  CHOL 320*  HDL 30*  LDLCALC UNABLE TO CALCULATE IF TRIGLYCERIDE OVER 400 mg/dL  TRIG 816*  CHOLHDL 10.7  LDLDIRECT 141.9*   Thyroid function studies Recent Labs    10/20/18 0403  TSH 3.336   Anemia work up No results for input(s): VITAMINB12, FOLATE, FERRITIN, TIBC, IRON, RETICCTPCT in the last 72 hours. Urinalysis    Component Value Date/Time   COLORURINE STRAW (A) 10/19/2018 1300   APPEARANCEUR CLEAR 10/19/2018 1300   LABSPEC 1.032 (H) 10/19/2018 1300   PHURINE 6.0 10/19/2018 1300   GLUCOSEU >=500 (A) 10/19/2018 1300   HGBUR NEGATIVE 10/19/2018 1300   BILIRUBINUR NEGATIVE 10/19/2018 1300   KETONESUR NEGATIVE 10/19/2018 1300   PROTEINUR NEGATIVE 10/19/2018 1300   UROBILINOGEN 1.0 03/04/2014 2312   NITRITE NEGATIVE 10/19/2018 1300   LEUKOCYTESUR NEGATIVE 10/19/2018 1300   Sepsis Labs Invalid input(s): PROCALCITONIN,  WBC,  LACTICIDVEN Microbiology Recent Results (from the past 240 hour(s))  SARS Coronavirus 2 [facility] order, Performed in [facility] hospital lab) Nasopharyngeal Nasopharyngeal Swab     Status: None   Collection Time: 10/19/18 12:53 PM   Specimen: Nasopharyngeal Swab  Result Value Ref Range Status   SARS Coronavirus 2 NEGATIVE NEGATIVE Final    Comment: (NOTE) If result is NEGATIVE SARS-CoV-2 target nucleic acids are NOT DETECTED. The SARS-CoV-2 RNA is generally detectable in upper and lower  respiratory specimens during the acute phase of  infection. The lowest  concentration of SARS-CoV-2 viral copies this assay can detect is 250  copies / mL. A negative result does not preclude SARS-CoV-2 infection  and should not be used as the sole basis for treatment or other  patient management decisions.  A negative result may occur with  improper specimen collection / handling, submission of specimen other  than nasopharyngeal swab, presence of viral mutation(s) within the  areas targeted by this assay, and inadequate number of viral copies  (<250 copies / mL). A negative result must be combined with clinical  observations, patient history, and epidemiological information. If result is POSITIVE SARS-CoV-2 target nucleic acids are DETECTED. The SARS-CoV-2 RNA is generally detectable in upper and lower  respiratory specimens dur ing the acute phase of infection.  Positive  results are indicative of active infection with SARS-CoV-2.  Clinical  correlation with  patient history and other diagnostic information is  necessary to determine patient infection status.  Positive results do  not rule out bacterial infection or co-infection with other viruses. If result is PRESUMPTIVE POSTIVE SARS-CoV-2 nucleic acids MAY BE PRESENT.   A presumptive positive result was obtained on the submitted specimen  and confirmed on repeat testing.  While 2019 novel coronavirus  (SARS-CoV-2) nucleic acids may be present in the submitted sample  additional confirmatory testing may be necessary for epidemiological  and / or clinical management purposes  to differentiate between  SARS-CoV-2 and other Sarbecovirus currently known to infect humans.  If clinically indicated additional testing with an alternate test  methodology [id number]) is advised. The SARS-CoV-2 RNA is generally  detectable in upper and lower respiratory sp ecimens during the acute  phase of infection. The expected result is Negative. Fact Sheet for Patients:   StrictlyIdeas.no Fact Sheet for Healthcare Providers: BankingDealers.co.za This test is not yet approved or cleared by the [place] FDA and has been authorized for detection and/or diagnosis of SARS-CoV-2 by FDA under an Emergency Use Authorization (EUA).  This EUA will remain in effect (meaning this test can be used) for the duration of the COVID-19 declaration under Section 564(b)(1) of the Act, 21 U.S.C. section 360bbb-3(b)(1), unless the authorization is terminated or revoked sooner. Performed at [facility], [place] [address] [place], [place] [address]      Time coordinating discharge: 35 minutes  SIGNED:   Cordelia Poche, MD [facility] 10/20/2018, 4:05 PM

## 2018-10-20 NOTE — Evaluation (Signed)
Speech Language Pathology Evaluation Patient Details Name: Vincent [name] MRN: [id number] DOB: 05-23-1965 Today's Date: 10/20/2018 Time: 1660-6301 SLP Time Calculation (min) (ACUTE ONLY): 22 min  Problem List:  Patient Active Problem List   Diagnosis Date Noted  . Uncontrolled type 2 DM with hyperosmolar nonketotic hyperglycemia (Vincent [name]) 10/19/2018  . Left sided numbness 10/19/2018  . Diabetic autonomic neuropathy associated with type 2 diabetes mellitus ([place]) 02/01/2018  . DM type 2 with diabetic peripheral neuropathy ([place]) 02/01/2018  . Dyslipidemia 01/31/2018  . Coronary artery disease involving native coronary artery of native heart without angina pectoris 01/31/2018  . Chest pain 01/01/2018  . Seizure disorder ([place]) 12/19/2016  . Essential tremor 12/19/2016  . Polypharmacy 12/19/2016  . Chronic post-traumatic stress disorder (PTSD) 06/16/2016  . Tremor 06/16/2016  . Tobacco use disorder 05/05/2016  . Psychophysiological insomnia 06/02/2015  . Altered mental status 05/28/2015  . Acute bronchitis 05/28/2015  . Type 2 diabetes mellitus with hyperglycemia ([place]) 05/28/2015  . Hypothyroidism 05/28/2015  . Bipolar 2 disorder ([place]) 05/28/2015  . History of rheumatoid arthritis 05/28/2015  . Chronic pain 05/28/2015  . Depression 03/18/2015  . Diabetes ([place]) 01/23/2015  . Obesity (BMI 30-39.9) 03/06/2014  . Acute encephalopathy 03/05/2014  . HLD (hyperlipidemia) 03/05/2014  . Anemia, normocytic normochromic 03/05/2014  . Altered mental state   . Protein-calorie malnutrition, severe ([place]) 10/09/2012  . Bladder tumor 08/08/2012  . Biliary dyskinesia 11/02/2010   Past Medical History:  Past Medical History:  Diagnosis Date  . Anxiety   . Bilateral carpal tunnel syndrome   . Bipolar 1 disorder ([place])   . CAD (coronary artery disease)    Non obstructive on CTA Oct 2019.   Marland Kitchen Chronic pain syndrome    back  . Cold extremities    BLE  . COPD (chronic obstructive pulmonary disease)  ([place])   . Cubital tunnel syndrome on right   . DDD (degenerative disc disease), lumbar   . Diabetic peripheral neuropathy ([place])   . Gastroparesis   . GERD (gastroesophageal reflux disease)   . Hiatal hernia   . History of bladder cancer urologist-  dr Consuella Lose   papillay TCC (Ta G1)  s/p TURBT and chemo instillation 2014  . History of bronchitis   . History of carpal tunnel syndrome    Bilateral  . History of chest pain 12/2017  . History of chest pain 12/2017   heart cath normal  . History of encephalopathy 05/27/2015   admission w/ acute encephalopathy thought to be secondary to pain meds and COPD  . History of gastric ulcer   . History of Helicobacter pylori infection   . History of kidney stones    not aware  . History of TIA (transient ischemic attack) 2008    no residual  . History of traumatic head injury 2010   w/ LOC  per pt needed stitches, hit in head with a mower blade  . Hyperlipidemia   . Hypertension   . Hypothyroidism   . Insomnia    per sleep study 04-19-2015 without sleep apnea  . Neuropathy in diabetes ([place])    LOWER EXTREMITIES  . PTSD (post-traumatic stress disorder)   . RA (rheumatoid arthritis) ([place])   . Seizures, transient [facility]) neurologist-  dr Krista Blue--  differential dx complex partial seizure .vs.  mood disorder .vs.  pseudoseizure--  negative EEG's   confusion episodes and staring spells since 11/ 2015  . Sleep apnea    Mild, NO CPAP ORDERED  . Transient confusion NEUOROLOGIST-  DR Krista Blue   Episodes since 11/ 2015--  neurologist dx  differential complex partial seizure  .vs. mood disorder . vs. pseudoseizure  . Tremor   . Type 2 diabetes mellitus treated with insulin [facility])    Past Surgical History:  Past Surgical History:  Procedure Laterality Date  . CARDIAC CATHETERIZATION  12-27-2001  DR Einar Gip  &  05-26-2009  DR Irish Lack   RESULTS FOR BOTH ARE NORMAL CORONARIES AND PERSERVED LVF/ EF 60%  . CARPAL TUNNEL RELEASE Right 09-16-2003  . CARPAL TUNNEL  RELEASE Left 02/25/2015   Procedure: LEFT CARPAL TUNNEL RELEASE;  Surgeon: Leanora Cover, MD;  Location: [place];  Service: Orthopedics;  Laterality: Left;  . COLONOSCOPY  2014  . CYSTOSCOPY N/A 10/10/2012   Procedure: CYSTOSCOPY CLOT EVACUATION FULGERATION OF BLEEDERS ;  Surgeon: Claybon Jabs, MD;  Location[facility];  Service: Urology;  Laterality: N/A;  . CYSTOSCOPY WITH BIOPSY N/A 11/26/2015   Procedure: CYSTOSCOPY WITH BIOPSY AND FULGURATION;  Surgeon: Kathie Rhodes, MD;  Location: [place];  Service: Urology;  Laterality: N/A;  . ESOPHAGOGASTRODUODENOSCOPY  2014  . LAPAROSCOPIC CHOLECYSTECTOMY  11-17-2010  . NEGATIVE SLEEP STUDY  04-19-2015  in epic  . ORCHIECTOMY Right 02/21/2016   Procedure: SCROTAL ORCHIECTOMY with TESTICULAR PROSTHESIS IMPLANT;  Surgeon: Kathie Rhodes, MD;  Location[facility];  Service: Urology;  Laterality: Right;  . ORCHIECTOMY Left 09/02/2018   Procedure: ORCHIECTOMY;  Surgeon: Kathie Rhodes, MD;  Location: [facility];  Service: Urology;  Laterality: Left;  . ROTATOR CUFF REPAIR Right 12/2004  . TRANSURETHRAL RESECTION OF BLADDER TUMOR N/A 08/09/2012   Procedure: TRANSURETHRAL RESECTION OF BLADDER TUMOR (TURBT) WITH GYRUS WITH MITOMYCIN C;  Surgeon: Claybon Jabs, MD;  Location: [facility];  Service: Urology;  Laterality: N/A;  . TRANSURETHRAL RESECTION OF BLADDER TUMOR WITH GYRUS (TURBT-GYRUS) N/A 02/27/2014   Procedure: TRANSURETHRAL RESECTION OF BLADDER TUMOR WITH GYRUS (TURBT-GYRUS);  Surgeon: Claybon Jabs, MD;  Location: [facility];  Service: Urology;  Laterality: N/A;   HPI:  Mr Vincent [name], 53y/o male, presented to ED with left-sided weakness and numbness and chest heaviness. PMH significant for uncontrolled type 2 diabetes, coronary artery disease (nonobstructive on CTA in October 2019), bipolar disorder, rheumatoid arthritis on chronic  opioid management, COPD, GERD, history of bladder cancer, hypertension, hypothyroidism, seizure disorder.  MRI showed no brain abnormalities.    Assessment / Plan / Recommendation Clinical Impression  Patient reports continue left sided numbness and weakness. Oral motor/sensory function assessed and patient has numbness on exterior and interior of left cheek. He reports his lips, tongue and all other areas of face are WNL. No dysarthria.  Oral motor strength and coordination are all WNL. Patient's cognition and language were assessed and found to be WNL. MOCA score was 27/30 (26 and above being WNL). ST services are not warranted at this time.     SLP Assessment  SLP Recommendation/Assessment: Patient does not need any further Speech Lanaguage Pathology Services SLP Visit Diagnosis: Cognitive communication deficit (R41.841)    Follow Up Recommendations   none               SLP Evaluation Cognition  Overall Cognitive Status: Within Functional Limits for tasks assessed Arousal/Alertness: Awake/alert Orientation Level: Oriented X4 Attention: Focused Focused Attention: Appears intact Memory: Appears intact Awareness: Appears intact Problem Solving: Appears intact Executive Function: Reasoning Reasoning: Appears intact Safety/Judgment: Appears intact  Comprehension  Auditory Comprehension Overall Auditory Comprehension: Appears within functional limits for tasks assessed Yes/No Questions: Within Functional Limits Commands: Within Functional Limits Conversation: Complex Visual Recognition/Discrimination Discrimination: Within Function Limits Reading Comprehension Reading Status: Within funtional limits    Expression Expression Primary Mode of Expression: Verbal Verbal Expression Overall Verbal Expression: Appears within functional limits for tasks assessed Initiation: No impairment Repetition: No impairment Naming: No impairment Pragmatics: No impairment Written  Expression Dominant Hand: Right Written Expression: Within Functional Limits   Oral / Motor  Motor Speech Overall Motor Speech: Appears within functional limits for tasks assessed Respiration: Within functional limits Phonation: Normal Resonance: Within functional limits Articulation: Within functional limitis Intelligibility: Intelligible Motor Planning: Witnin functional limits Motor Speech Errors: Not applicable   GO                   Charlynne Cousins Sanaia Jasso, MA, CCC-SLP 10/20/2018 11:40 AM

## 2018-10-20 NOTE — Evaluation (Signed)
Physical Therapy Evaluation Patient Details Name: Vincent [name] MRN: [id number] DOB: March 13, 1966 Today's Date: 10/20/2018   History of Present Illness  Pt is a 53 y/o male who presents with chest pain and L side numbness. Pt reports some confusion that has since resolved. MRI negative for acute changes. PMH significant for DM II, tremor, transient seizures, RA, PTSD, neuropathy, HTN, TBI 2010, TIA, COPD, chronic pain syndrome, CAD, bipolar 1 disorder.  Clinical Impression  Pt admitted with above diagnosis. Pt currently with functional limitations due to the deficits listed below (see PT Problem List). At the time of PT eval pt was able to perform transfers and ambulation with gross min guard assist to supervision for safety. Pt reports he has had good experiences with HHPT in the past and is open to that at d/c. Acutely, pt will benefit from skilled PT to increase their independence and safety with mobility to allow discharge to the venue listed below.       Follow Up Recommendations Home health PT;Supervision for mobility/OOB    Equipment Recommendations  None recommended by PT    Recommendations for Other Services       Precautions / Restrictions Precautions Precautions: Fall Restrictions Weight Bearing Restrictions: No      Mobility  Bed Mobility Overal bed mobility: Modified Independent Bed Mobility: Supine to Sit;Sit to Supine           General bed mobility comments: Pt was able to transition to/from EOB without assistance. HOB slightly elevated with no use of rails.   Transfers Overall transfer level: Needs assistance Equipment used: None Transfers: Sit to/from Stand Sit to Stand: Supervision         General transfer comment: Close supervision for safety. Pt appeared guarded upon full stand but no assist required and no overt LOB noted.   Ambulation/Gait Ambulation/Gait assistance: Min guard Gait Distance (Feet): 100 Feet Assistive device: Rolling walker (2  wheeled) Gait Pattern/deviations: Step-through pattern;Decreased stride length;Decreased weight shift to left Gait velocity: Decreased Gait velocity interpretation: <1.8 ft/sec, indicate of risk for recurrent falls General Gait Details: Pt with antalgia from weak LLE. Improved with RW for support however overall appears to have difficulty with coordination of LLE at times. No foot drop noted.   Stairs            Wheelchair Mobility    Modified Rankin (Stroke Patients Only) Modified Rankin (Stroke Patients Only) Pre-Morbid Rankin Score: No symptoms Modified Rankin: Moderately severe disability     Balance Overall balance assessment: Needs assistance Sitting-balance support: Feet supported;No upper extremity supported Sitting balance-Leahy Scale: Good     Standing balance support: No upper extremity supported;During functional activity Standing balance-Leahy Scale: Poor Standing balance comment: Without RW, reaching for external support.                              Pertinent Vitals/Pain Pain Assessment: Faces Faces Pain Scale: Hurts little more Pain Location: Neuropathy B feet and lower legs - reports worse than normal Pain Descriptors / Indicators: Pins and needles Pain Intervention(s): Monitored during session    Home Living Family/patient expects to be discharged to:: Private residence Living Arrangements: Spouse/significant other;Children Available Help at Discharge: Family;Available 24 hours/day Type of Home: House Home Access: Stairs to enter Entrance Stairs-Rails: Right;Left;Can reach both Entrance Stairs-Number of Steps: 3 Home Layout: One level Home Equipment: Cane - single point;Walker - 2 wheels      Prior Function Level  of Independence: Independent         Comments: Works for [employer], a lot of strenuous activity. retired Administrator.      Hand Dominance        Extremity/Trunk Assessment   Upper Extremity  Assessment Upper Extremity Assessment: RUE deficits/detail;LUE deficits/detail RUE Deficits / Details: Tremor - pt reports baseline and very mild. Only noted with finger to nose testing RUE Sensation: WNL RUE Coordination: WNL LUE Deficits / Details: Tremor - pt reports baseline. Very notable with MMT and coordination testing.  LUE Sensation: decreased light touch LUE Coordination: decreased gross motor;decreased fine motor(mild)    Lower Extremity Assessment Lower Extremity Assessment: RLE deficits/detail;LLE deficits/detail RLE Sensation: history of peripheral neuropathy(but normal per pt) LLE Sensation: decreased light touch;history of peripheral neuropathy(more decreased than normal;) LLE Coordination: decreased gross motor    Cervical / Trunk Assessment Cervical / Trunk Assessment: Normal(Forward head posture with rounded shoulders)  Communication   Communication: No difficulties  Cognition Arousal/Alertness: Awake/alert Behavior During Therapy: WFL for tasks assessed/performed Overall Cognitive Status: Within Functional Limits for tasks assessed                                        General Comments      Exercises     Assessment/Plan    PT Assessment Patient needs continued PT services  PT Problem List Decreased strength;Decreased activity tolerance;Decreased balance;Decreased mobility;Decreased knowledge of use of DME;Decreased safety awareness;Decreased knowledge of precautions;Decreased coordination;Impaired sensation       PT Treatment Interventions DME instruction;Gait training;Stair training;Functional mobility training;Therapeutic exercise;Therapeutic activities;Neuromuscular re-education;Patient/family education    PT Goals (Current goals can be found in the Care Plan section)  Acute Rehab PT Goals Patient Stated Goal: Figure out what is causing this PT Goal Formulation: With patient Time For Goal Achievement: 10/27/18 Potential to Achieve  Goals: Good    Frequency Min 4X/week   Barriers to discharge        Co-evaluation               AM-PAC PT "6 Clicks" Mobility  Outcome Measure Help needed turning from your back to your side while in a flat bed without using bedrails?: None Help needed moving from lying on your back to sitting on the side of a flat bed without using bedrails?: None Help needed moving to and from a bed to a chair (including a wheelchair)?: None Help needed standing up from a chair using your arms (e.g., wheelchair or bedside chair)?: None Help needed to walk in hospital room?: A Little Help needed climbing 3-5 steps with a railing? : A Little 6 Click Score: 22    End of Session Equipment Utilized During Treatment: Gait belt Activity Tolerance: Patient tolerated treatment well Patient left: in bed;with call bell/phone within reach(Bed in chair position. ) Nurse Communication: Mobility status PT Visit Diagnosis: Unsteadiness on feet (R26.81);Other symptoms and signs involving the nervous system (R29.898)    Time: 6979-4801 PT Time Calculation (min) (ACUTE ONLY): 26 min   Charges:   PT Evaluation $PT Eval Moderate Complexity: 1 Mod PT Treatments $Gait Training: 8-22 mins        Rolinda Roan, PT, DPT Acute Rehabilitation Services Pager: [phone] Office: [phone]   Thelma Comp 10/20/2018, 11:09 AM

## 2018-10-20 NOTE — Discharge Instructions (Signed)
Vincent [name],  You were in the hospital because of left sided weakness. The neurologist believes you had a TIA. Your medications have been adjusted. Please follow-up with the neurologist.    Transient Ischemic Attack  A transient ischemic attack (TIA) is a "warning stroke" that causes stroke-like symptoms that go away quickly. A TIA does not cause lasting damage to the brain. But having a TIA is a sign that you may be at risk for a stroke. Lifestyle changes and medical treatments can help prevent a stroke. It is important to know the symptoms of a TIA and what to do. Get help right away, even if your symptoms go away. The symptoms of a TIA are the same as those of a stroke. They can happen fast, and they usually go away within minutes or hours. They can include:  Weakness or loss of feeling in your face, arm, or leg. This often happens on one side of your body.  Trouble walking.  Trouble moving your arms or legs.  Trouble talking or understanding what people are saying.  Trouble seeing.  Seeing two of one object (double vision).  Feeling dizzy.  Feeling confused.  Loss of balance or coordination.  Feeling sick to your stomach (nauseous) and throwing up (vomiting).  A very bad headache for no reason. What increases the risk? Certain things may make you more likely to have a TIA. Some of these are things that you can change, such as:  Being very overweight (obese).  Using products that contain nicotine or tobacco, such as cigarettes and e-cigarettes.  Taking birth control pills.  Not being active.  Drinking too much alcohol.  Using drugs. Other risk factors include:  Having an irregular heartbeat (atrial fibrillation).  Being African American or Hispanic.  Having had blood clots, stroke, TIA, or heart attack in the past.  Being a woman with a history of high blood pressure in pregnancy (preeclampsia).  Being over the age of 87.  Being male.  Having family  history of stroke.  Having the following diseases or conditions: ? High blood pressure. ? High cholesterol. ? Diabetes. ? Heart disease. ? Sickle cell disease. ? Sleep apnea. ? Migraine headache. ? Long-term (chronic) diseases that cause soreness and swelling (inflammation). ? Disorders that affect how your blood clots. Follow these instructions at home: Medicines   Take over-the-counter and prescription medicines only as told by your doctor.  If you were told to take aspirin or another medicine to thin your blood, take it exactly as told by your doctor. ? Taking too much of the medicine can cause bleeding. ? Taking too little of the medicine may not work to treat the problem. Eating and drinking   Eat 5 or more servings of fruits and vegetables each day.  Follow instructions from your doctor about your diet. You may need to follow a certain diet to help lower your risk of having a stroke. You may need to: ? Eat a diet that is low in fat and salt. ? Eat foods that contain a lot of fiber. ? Limit the amount of carbohydrates and sugar in your diet.  Limit alcohol intake to 1 drink a day for nonpregnant women and 2 drinks a day for men. One drink equals 12 oz of beer, 5 oz of wine, or 1 oz of hard liquor. General instructions  Keep a healthy weight.  Stay active. Try to get at least 30 minutes of activity on all or most days.  Find  out if you have a condition called sleep apnea. Get treatment if needed.  Do not use any products that contain nicotine or tobacco, such as cigarettes and e-cigarettes. If you need help quitting, ask your doctor.  Do not abuse drugs.  Keep all follow-up visits as told by your doctor. This is important. Get help right away if:  You have any signs of stroke. "BE FAST" is an easy way to remember the main warning signs: ? B - Balance. Signs are dizziness, sudden trouble walking, or loss of balance. ? E - Eyes. Signs are trouble seeing or a sudden  change in how you see. ? F - Face. Signs are sudden weakness or loss of feeling of the face, or the face or eyelid drooping on one side. ? A - Arms. Signs are weakness or loss of feeling in an arm. This happens suddenly and usually on one side of the body. ? S - Speech. Signs are sudden trouble speaking, slurred speech, or trouble understanding what people say. ? T - Time. Time to call emergency services. Write down what time symptoms started.  You have other signs of stroke, such as: ? A sudden, very bad headache with no known cause. ? Feeling sick to your stomach (nausea). ? Throwing up (vomiting). ? Jerky movements that you cannot control (seizure). These symptoms may be an emergency. Do not wait to see if the symptoms will go away. Get medical help right away. Call your local emergency services (911 in the [place] Do not drive yourself to the hospital. Summary  A transient ischemic attack (TIA) is a "warning stroke" that causes stroke-like symptoms that go away quickly.  A TIA is a medical emergency. Get help right away, even if your symptoms go away.  A TIA does not cause lasting damage to the brain.  Having a TIA is a sign that you may be at risk for a stroke. Lifestyle changes and medical treatments can help prevent a stroke. This information is not intended to replace advice given to you by your health care provider. Make sure you discuss any questions you have with your health care provider. Document Released: 12/07/2007 Document Revised: 11/23/2017 Document Reviewed: 05/31/2016 Elsevier Patient Education  [place].   Diabetes Mellitus and Nutrition, Adult When you have diabetes (diabetes mellitus), it is very important to have healthy eating habits because your blood sugar (glucose) levels are greatly affected by what you eat and drink. Eating healthy foods in the appropriate amounts, at about the same times every day, can help you:  Control your blood  glucose.  Lower your risk of heart disease.  Improve your blood pressure.  Reach or maintain a healthy weight. Every person with diabetes is different, and each person has different needs for a meal plan. Your health care provider may recommend that you work with a diet and nutrition specialist (dietitian) to make a meal plan that is best for you. Your meal plan may vary depending on factors such as:  The calories you need.  The medicines you take.  Your weight.  Your blood glucose, blood pressure, and cholesterol levels.  Your activity level.  Other health conditions you have, such as heart or kidney disease. How do carbohydrates affect me? Carbohydrates, also called carbs, affect your blood glucose level more than any other type of food. Eating carbs naturally raises the amount of glucose in your blood. Carb counting is a method for keeping track of how many carbs you eat.  Counting carbs is important to keep your blood glucose at a healthy level, especially if you use insulin or take certain oral diabetes medicines. It is important to know how many carbs you can safely have in each meal. This is different for every person. Your dietitian can help you calculate how many carbs you should have at each meal and for each snack. Foods that contain carbs include:  Bread, cereal, rice, pasta, and crackers.  Potatoes and corn.  Peas, beans, and lentils.  Milk and yogurt.  Fruit and juice.  Desserts, such as cakes, cookies, ice cream, and candy. How does alcohol affect me? Alcohol can cause a sudden decrease in blood glucose (hypoglycemia), especially if you use insulin or take certain oral diabetes medicines. Hypoglycemia can be a life-threatening condition. Symptoms of hypoglycemia (sleepiness, dizziness, and confusion) are similar to symptoms of having too much alcohol. If your health care provider says that alcohol is safe for you, follow these guidelines:  Limit alcohol intake to  no more than 1 drink per day for nonpregnant women and 2 drinks per day for men. One drink equals 12 oz of beer, 5 oz of wine, or 1 oz of hard liquor.  Do not drink on an empty stomach.  Keep yourself hydrated with water, diet soda, or unsweetened iced tea.  Keep in mind that regular soda, juice, and other mixers may contain a lot of sugar and must be counted as carbs. What are tips for following this plan?  Reading food labels  Start by checking the serving size on the "Nutrition Facts" label of packaged foods and drinks. The amount of calories, carbs, fats, and other nutrients listed on the label is based on one serving of the item. Many items contain more than one serving per package.  Check the total grams (g) of carbs in one serving. You can calculate the number of servings of carbs in one serving by dividing the total carbs by 15. For example, if a food has 30 g of total carbs, it would be equal to 2 servings of carbs.  Check the number of grams (g) of saturated and trans fats in one serving. Choose foods that have low or no amount of these fats.  Check the number of milligrams (mg) of salt (sodium) in one serving. Most people should limit total sodium intake to less than 2,300 mg per day.  Always check the nutrition information of foods labeled as "low-fat" or "nonfat". These foods may be higher in added sugar or refined carbs and should be avoided.  Talk to your dietitian to identify your daily goals for nutrients listed on the label. Shopping  Avoid buying canned, premade, or processed foods. These foods tend to be high in fat, sodium, and added sugar.  Shop around the outside edge of the grocery store. This includes fresh fruits and vegetables, bulk grains, fresh meats, and fresh dairy. Cooking  Use low-heat cooking methods, such as baking, instead of high-heat cooking methods like deep frying.  Cook using healthy oils, such as olive, canola, or sunflower oil.  Avoid  cooking with butter, cream, or high-fat meats. Meal planning  Eat meals and snacks regularly, preferably at the same times every day. Avoid going long periods of time without eating.  Eat foods high in fiber, such as fresh fruits, vegetables, beans, and whole grains. Talk to your dietitian about how many servings of carbs you can eat at each meal.  Eat 4-6 ounces (oz) of lean protein each  day, such as lean meat, chicken, fish, eggs, or tofu. One oz of lean protein is equal to: ? 1 oz of meat, chicken, or fish. ? 1 egg. ?  cup of tofu.  Eat some foods each day that contain healthy fats, such as avocado, nuts, seeds, and fish. Lifestyle  Check your blood glucose regularly.  Exercise regularly as told by your health care provider. This may include: ? 150 minutes of moderate-intensity or vigorous-intensity exercise each week. This could be brisk walking, biking, or water aerobics. ? Stretching and doing strength exercises, such as yoga or weightlifting, at least 2 times a week.  Take medicines as told by your health care provider.  Do not use any products that contain nicotine or tobacco, such as cigarettes and e-cigarettes. If you need help quitting, ask your health care provider.  Work with a Social worker or diabetes educator to identify strategies to manage stress and any emotional and social challenges. Questions to ask a health care provider  Do I need to meet with a diabetes educator?  Do I need to meet with a dietitian?  What number can I call if I have questions?  When are the best times to check my blood glucose? Where to find more information:  American Diabetes Association: diabetes.org  Academy of Nutrition and Dietetics: www.eatright.[employer] of Diabetes and Digestive and Kidney Diseases (NIH): [employer] Summary  A healthy meal plan will help you control your blood glucose and maintain a healthy lifestyle.  Working with a diet and nutrition  specialist (dietitian) can help you make a meal plan that is best for you.  Keep in mind that carbohydrates (carbs) and alcohol have immediate effects on your blood glucose levels. It is important to count carbs and to use alcohol carefully. This information is not intended to replace advice given to you by your health care provider. Make sure you discuss any questions you have with your health care provider. Document Released: 11/24/2004 Document Revised: 02/09/2017 Document Reviewed: 04/03/2016 Elsevier Patient Education  2020 [employer].

## 2018-11-06 DIAGNOSIS — Z8551 Personal history of malignant neoplasm of bladder: Secondary | ICD-10-CM | POA: Diagnosis not present

## 2018-12-09 ENCOUNTER — Telehealth (HOSPITAL_COMMUNITY): Payer: Self-pay | Admitting: Psychology

## 2018-12-09 NOTE — Telephone Encounter (Signed)
Called and left message offering pt appointment.  Informed to call office to schedule.

## 2018-12-10 ENCOUNTER — Encounter (HOSPITAL_COMMUNITY): Payer: Self-pay

## 2018-12-10 ENCOUNTER — Emergency Department (HOSPITAL_COMMUNITY)
Admission: EM | Admit: 2018-12-10 | Discharge: 2018-12-10 | Disposition: A | Discharge status: Home / Self Care | Payer: No Typology Code available for payment source | Attending: Emergency Medicine | Admitting: Emergency Medicine

## 2018-12-10 ENCOUNTER — Emergency Department (HOSPITAL_COMMUNITY): Payer: No Typology Code available for payment source

## 2018-12-10 ENCOUNTER — Other Ambulatory Visit: Payer: Self-pay

## 2018-12-10 DIAGNOSIS — Z209 Contact with and (suspected) exposure to unspecified communicable disease: Secondary | ICD-10-CM | POA: Diagnosis not present

## 2018-12-10 DIAGNOSIS — I10 Essential (primary) hypertension: Secondary | ICD-10-CM | POA: Diagnosis not present

## 2018-12-10 DIAGNOSIS — F17210 Nicotine dependence, cigarettes, uncomplicated: Secondary | ICD-10-CM | POA: Diagnosis not present

## 2018-12-10 DIAGNOSIS — Z79899 Other long term (current) drug therapy: Secondary | ICD-10-CM | POA: Diagnosis not present

## 2018-12-10 DIAGNOSIS — E039 Hypothyroidism, unspecified: Secondary | ICD-10-CM | POA: Insufficient documentation

## 2018-12-10 DIAGNOSIS — Z794 Long term (current) use of insulin: Secondary | ICD-10-CM | POA: Insufficient documentation

## 2018-12-10 DIAGNOSIS — Z7902 Long term (current) use of antithrombotics/antiplatelets: Secondary | ICD-10-CM | POA: Diagnosis not present

## 2018-12-10 DIAGNOSIS — J441 Chronic obstructive pulmonary disease with (acute) exacerbation: Secondary | ICD-10-CM | POA: Diagnosis not present

## 2018-12-10 DIAGNOSIS — Z20828 Contact with and (suspected) exposure to other viral communicable diseases: Secondary | ICD-10-CM | POA: Insufficient documentation

## 2018-12-10 DIAGNOSIS — E119 Type 2 diabetes mellitus without complications: Secondary | ICD-10-CM | POA: Diagnosis not present

## 2018-12-10 DIAGNOSIS — R0602 Shortness of breath: Secondary | ICD-10-CM | POA: Diagnosis not present

## 2018-12-10 DIAGNOSIS — I2510 Atherosclerotic heart disease of native coronary artery without angina pectoris: Secondary | ICD-10-CM | POA: Insufficient documentation

## 2018-12-10 DIAGNOSIS — R0902 Hypoxemia: Secondary | ICD-10-CM | POA: Diagnosis not present

## 2018-12-10 DIAGNOSIS — E1165 Type 2 diabetes mellitus with hyperglycemia: Secondary | ICD-10-CM | POA: Diagnosis not present

## 2018-12-10 DIAGNOSIS — R05 Cough: Secondary | ICD-10-CM | POA: Diagnosis not present

## 2018-12-10 LAB — BASIC METABOLIC PANEL
Anion gap: 11 (ref 5–15)
BUN: 19 mg/dL (ref 6–20)
CO2: 22 mmol/L (ref 22–32)
Calcium: 9.3 mg/dL (ref 8.9–10.3)
Chloride: 106 mmol/L (ref 98–111)
Creatinine, Ser: 0.88 mg/dL (ref 0.61–1.24)
GFR calc Af Amer: >60 mL/min (ref >60)
GFR calc non Af Amer: >60 mL/min (ref >60)
Glucose, Bld: 298 mg/dL — ABNORMAL HIGH (ref 70–99)
Potassium: 4.2 mmol/L (ref 3.5–5.1)
Sodium: 139 mmol/L (ref 135–145)

## 2018-12-10 LAB — CBG MONITORING, ED: Glucose-Capillary: 265 mg/dL — ABNORMAL HIGH (ref 70–99)

## 2018-12-10 LAB — CBC WITH DIFFERENTIAL/PLATELET
Abs Immature Granulocytes: 0.05 10*3/uL (ref 0.00–0.07)
Basophils Absolute: 0.1 10*3/uL (ref 0.0–0.1)
Basophils Relative: 1 %
Eosinophils Absolute: 0 10*3/uL (ref 0.0–0.5)
Eosinophils Relative: 0 %
HCT: 42.4 % (ref 39.0–52.0)
Hemoglobin: 14.8 g/dL (ref 13.0–17.0)
Immature Granulocytes: 1 %
Lymphocytes Relative: 31 %
Lymphs Abs: 2.2 10*3/uL (ref 0.7–4.0)
MCH: 29.5 pg (ref 26.0–34.0)
MCHC: 34.9 g/dL (ref 30.0–36.0)
MCV: 84.6 fL (ref 80.0–100.0)
Monocytes Absolute: 0.8 10*3/uL (ref 0.1–1.0)
Monocytes Relative: 11 %
Neutro Abs: 4 10*3/uL (ref 1.7–7.7)
Neutrophils Relative %: 56 %
Platelets: 210 10*3/uL (ref 150–400)
RBC: 5.01 MIL/uL (ref 4.22–5.81)
RDW: 12.4 % (ref 11.5–15.5)
WBC: 7.1 10*3/uL (ref 4.0–10.5)
nRBC: 0 % (ref 0.0–0.2)

## 2018-12-10 LAB — POC OCCULT BLOOD, ED: Fecal Occult Bld: NEGATIVE

## 2018-12-10 LAB — TROPONIN I (HIGH SENSITIVITY): Troponin I (High Sensitivity): <2 ng/L (ref <18)

## 2018-12-10 MED ORDER — PREDNISONE 20 MG PO TABS
40.0000 mg | ORAL_TABLET | Freq: Once | ORAL | Status: AC
  Administered 2018-12-10: 40 mg via ORAL
  Filled 2018-12-10: qty 2

## 2018-12-10 MED ORDER — AZITHROMYCIN 250 MG PO TABS: 250.0000 mg | ORAL_TABLET | Freq: Every day | ORAL | 0 refills | Status: DC

## 2018-12-10 MED ORDER — ALBUTEROL SULFATE HFA 108 (90 BASE) MCG/ACT IN AERS
4.0000 | INHALATION_SPRAY | Freq: Once | RESPIRATORY_TRACT | Status: AC
  Administered 2018-12-10: 4 via RESPIRATORY_TRACT
  Filled 2018-12-10: qty 6.7

## 2018-12-10 MED ORDER — PREDNISONE 20 MG PO TABS: 40.0000 mg | ORAL_TABLET | Freq: Every day | ORAL | 0 refills | Status: AC

## 2018-12-10 MED ORDER — ALBUTEROL SULFATE HFA 108 (90 BASE) MCG/ACT IN AERS: 1.0000 | INHALATION_SPRAY | Freq: Four times a day (QID) | RESPIRATORY_TRACT | 0 refills | Status: DC | PRN

## 2018-12-10 NOTE — Discharge Instructions (Signed)
Recommending using the inhaler as needed for wheezing.  Please take the steroids as prescribed for your COPD exacerbation.  Please monitor your sugars closely as discussed and call your primary care doctor for assistance with ongoing diabetes management.  Please schedule a follow-up appointment for recheck with your primary doctor within 48 hours.  If you have worsening difficulty breathing, fever or other new concerning symptom please return to ER for recheck.  Please follow isolation cautions until the COVID-19 test has resulted.

## 2018-12-10 NOTE — ED Triage Notes (Signed)
[facility] from [facility]. A/o x4. Pt reports pink sputum, productive cough,sob, n/v/d x 5 days. Ems reports albuterol neb given at PCP unknown dose. History of COPD and diabetes.   HR 95 96 % on RA  150/83 RR 20 CBG 380

## 2018-12-10 NOTE — ED Provider Notes (Signed)
[facility] DEPT Provider Note   CSN: [id number] Arrival date & time: 12/10/18  1614     History   Chief Complaint No chief complaint on file.   HPI Vincent [name] is a 53 y.o. male.  Presents emerged department with a chief complaint of shortness of breath, cough.  Patient states symptoms started with increased congestion, then developed cough.  Cough has been mildly productive, nonbloody, mostly white sputum.  States he is noted some wheezing and a mild to moderate difficulty breathing.  No associated chest pain.  No associated headache.  Has had some nausea and a couple episodes of nonbloody nonbilious emesis, also having a couple loose stools, noted to be darker than normal.  Denies associated abdominal pain.  Denies known sick contacts.  Initially went to his primary doctor who recommended he go to the ER for further assessment.     HPI  Past Medical History:  Diagnosis Date  . Anxiety   . Bilateral carpal tunnel syndrome   . Bipolar 1 disorder ([place])   . CAD (coronary artery disease)    Non obstructive on CTA Oct 2019.   Marland Kitchen Chronic pain syndrome    back  . Cold extremities    BLE  . COPD (chronic obstructive pulmonary disease) ([place])   . Cubital tunnel syndrome on right   . DDD (degenerative disc disease), lumbar   . Diabetic peripheral neuropathy ([place])   . Gastroparesis   . GERD (gastroesophageal reflux disease)   . Hiatal hernia   . History of bladder cancer urologist-  dr Consuella Lose   papillay TCC (Ta G1)  s/p TURBT and chemo instillation 2014  . History of bronchitis   . History of carpal tunnel syndrome    Bilateral  . History of chest pain 12/2017  . History of chest pain 12/2017   heart cath normal  . History of encephalopathy 05/27/2015   admission w/ acute encephalopathy thought to be secondary to pain meds and COPD  . History of gastric ulcer   . History of Helicobacter pylori infection   . History of kidney stones    not  aware  . History of TIA (transient ischemic attack) 2008    no residual  . History of traumatic head injury 2010   w/ LOC  per pt needed stitches, hit in head with a mower blade  . Hyperlipidemia   . Hypertension   . Hypothyroidism   . Insomnia    per sleep study 04-19-2015 without sleep apnea  . Neuropathy in diabetes ([place])    LOWER EXTREMITIES  . PTSD (post-traumatic stress disorder)   . RA (rheumatoid arthritis) ([place])   . Seizures, transient [facility]) neurologist-  dr Krista Blue--  differential dx complex partial seizure .vs.  mood disorder .vs.  pseudoseizure--  negative EEG's   confusion episodes and staring spells since 11/ 2015  . Sleep apnea    Mild, NO CPAP ORDERED  . Transient confusion NEUOROLOGIST-  DR YAN   Episodes since 11/ 2015--  neurologist dx  differential complex partial seizure  .vs. mood disorder . vs. pseudoseizure  . Tremor   . Type 2 diabetes mellitus treated with insulin [facility])     Patient Active Problem List   Diagnosis Date Noted  . TIA (transient ischemic attack) 10/20/2018  . Uncontrolled type 2 DM with hyperosmolar nonketotic hyperglycemia ([place]) 10/19/2018  . Left sided numbness 10/19/2018  . Diabetic autonomic neuropathy associated with type 2 diabetes mellitus ([place]) 02/01/2018  .  DM type 2 with diabetic peripheral neuropathy ([place]) 02/01/2018  . Dyslipidemia 01/31/2018  . Coronary artery disease involving native coronary artery of native heart without angina pectoris 01/31/2018  . Chest pain 01/01/2018  . Seizure disorder ([place]) 12/19/2016  . Essential tremor 12/19/2016  . Polypharmacy 12/19/2016  . Chronic post-traumatic stress disorder (PTSD) 06/16/2016  . Tremor 06/16/2016  . Tobacco use disorder 05/05/2016  . Psychophysiological insomnia 06/02/2015  . Altered mental status 05/28/2015  . Acute bronchitis 05/28/2015  . Type 2 diabetes mellitus with hyperglycemia ([place]) 05/28/2015  . Hypothyroidism 05/28/2015  . Bipolar 2 disorder ([place]) 05/28/2015  .  History of rheumatoid arthritis 05/28/2015  . Chronic pain 05/28/2015  . Depression 03/18/2015  . Diabetes ([place]) 01/23/2015  . Obesity (BMI 30-39.9) 03/06/2014  . Acute encephalopathy 03/05/2014  . HLD (hyperlipidemia) 03/05/2014  . Anemia, normocytic normochromic 03/05/2014  . Altered mental state   . Protein-calorie malnutrition, severe ([place]) 10/09/2012  . Bladder tumor 08/08/2012  . Biliary dyskinesia 11/02/2010    Past Surgical History:  Procedure Laterality Date  . CARDIAC CATHETERIZATION  12-27-2001  DR Einar Gip  &  05-26-2009  DR Irish Lack   RESULTS FOR BOTH ARE NORMAL CORONARIES AND PERSERVED LVF/ EF 60%  . CARPAL TUNNEL RELEASE Right 09-16-2003  . CARPAL TUNNEL RELEASE Left 02/25/2015   Procedure: LEFT CARPAL TUNNEL RELEASE;  Surgeon: Leanora Cover, MD;  Location: [facility];  Service: Orthopedics;  Laterality: Left;  . COLONOSCOPY  2014  . CYSTOSCOPY N/A 10/10/2012   Procedure: CYSTOSCOPY CLOT EVACUATION FULGERATION OF BLEEDERS ;  Surgeon: Claybon Jabs, MD;  Location: [facility];  Service: Urology;  Laterality: N/A;  . CYSTOSCOPY WITH BIOPSY N/A 11/26/2015   Procedure: CYSTOSCOPY WITH BIOPSY AND FULGURATION;  Surgeon: Kathie Rhodes, MD;  Location: [facility];  Service: Urology;  Laterality: N/A;  . ESOPHAGOGASTRODUODENOSCOPY  2014  . LAPAROSCOPIC CHOLECYSTECTOMY  11-17-2010  . NEGATIVE SLEEP STUDY  04-19-2015  in epic  . ORCHIECTOMY Right 02/21/2016   Procedure: SCROTAL ORCHIECTOMY with TESTICULAR PROSTHESIS IMPLANT;  Surgeon: Kathie Rhodes, MD;  Location[facility];  Service: Urology;  Laterality: Right;  . ORCHIECTOMY Left 09/02/2018   Procedure: ORCHIECTOMY;  Surgeon: Kathie Rhodes, MD;  Location[facility];  Service: Urology;  Laterality: Left;  . ROTATOR CUFF REPAIR Right 12/2004  . TRANSURETHRAL RESECTION OF BLADDER TUMOR N/A 08/09/2012   Procedure: TRANSURETHRAL RESECTION OF BLADDER TUMOR  (TURBT) WITH GYRUS WITH MITOMYCIN C;  Surgeon: Claybon Jabs, MD;  Location: [facility];  Service: Urology;  Laterality: N/A;  . TRANSURETHRAL RESECTION OF BLADDER TUMOR WITH GYRUS (TURBT-GYRUS) N/A 02/27/2014   Procedure: TRANSURETHRAL RESECTION OF BLADDER TUMOR WITH GYRUS (TURBT-GYRUS);  Surgeon: Claybon Jabs, MD;  Location: [facility];  Service: Urology;  Laterality: N/A;        Home Medications    Prior to Admission medications   Medication Sig Start Date End Date Taking? Authorizing Provider  ARIPiprazole (ABILIFY) 5 MG tablet Take 1 tablet (5 mg total) by mouth daily. 09/16/18   Arfeen, Arlyce Harman, MD  atorvastatin (LIPITOR) 40 MG tablet Take 1 tablet (40 mg total) by mouth daily at 6 PM. 05/06/16   Pucilowska, Jolanta B, MD  busPIRone (BUSPAR) 5 MG tablet Take 1 tablet (5 mg total) by mouth 2 (two) times daily. 09/16/18 09/16/19  Arfeen, Arlyce Harman, MD  Cholecalciferol (VITAMIN D3) 2000 units TABS TAKE 1 TABLET BY MOUTH ONCE DAILY Patient  taking differently: Take 2,000 Units by mouth daily.  10/10/17   Eksir,  Miu, MD  clopidogrel (PLAVIX) 75 MG tablet Take 1 tablet (75 mg total) by mouth daily. 10/21/18   Mariel Aloe, MD  cyclobenzaprine (FLEXERIL) 10 MG tablet Take 1 tablet by mouth 3 times daily as needed for muscle spasm. Warning: May cause drowsiness. 03/16/18   Vanessa Kick, MD  Dulaglutide (TRULICITY) [id number] SOPN Inject 1 pen into the skin once a week.    [provider]  fenofibrate 160 MG tablet Take 1 tablet (160 mg total) by mouth daily. 01/03/18 10/19/18  Elodia Florence., MD  fentaNYL (DURAGESIC - DOSED MCG/HR) 75 MCG/HR Place 75 mcg onto the skin every 3 (three) days.     [provider]  gabapentin (NEURONTIN) 600 MG tablet Take 600 mg by mouth 2 (two) times daily. 08/20/18   [provider]  hydroxychloroquine (PLAQUENIL) 200 MG tablet Take 200 mg by mouth 2 (two) times daily.     [provider]   Insulin Glargine (BASAGLAR KWIKPEN) 100 UNIT/ML SOPN Inject 70 Units into the skin 2 (two) times daily.  08/05/17   [provider]  insulin lispro (HUMALOG) 100 UNIT/ML injection Inject 40 Units into the skin 3 (three) times daily before meals.     [provider]  isosorbide mononitrate (IMDUR) 120 MG 24 hr tablet Take 1 tablet (120 mg total) by mouth daily. Patient taking differently: Take 120 mg by mouth every morning.  01/31/18 03/02/18  Minus Breeding, MD  lamoTRIgine (LAMICTAL) 150 MG tablet Take 2 tablets (300 mg total) by mouth every morning. 09/16/18   Arfeen, Arlyce Harman, MD  levothyroxine (SYNTHROID, LEVOTHROID) 25 MCG tablet Take 25 mcg by mouth daily before breakfast.     [provider]  metFORMIN (GLUCOPHAGE-XR) 500 MG 24 hr tablet Take 500 mg by mouth 2 (two) times daily. 08/24/18   [provider]  metoprolol tartrate (LOPRESSOR) 25 MG tablet Take 1 tablet (25 mg total) by mouth 2 (two) times daily. 01/03/18 02/02/18  Elodia Florence., MD  mirtazapine ([name]) 30 MG tablet Take 1 tablet (30 mg total) by mouth at bedtime. 09/16/18   Arfeen, Arlyce Harman, MD  mupirocin ointment (BACTROBAN) 2 % Apply 1 application topically 2 (two) times daily. Apply to the affected area 2 times a day Patient not taking: Reported on 10/19/2018 01/24/18   Newt Minion, MD  omeprazole (PRILOSEC) 20 MG capsule Take 20 mg by mouth every morning.     [provider]  ondansetron (ZOFRAN-ODT) 4 MG disintegrating tablet Take 1 tablet (4 mg total) by mouth every 8 (eight) hours as needed for nausea or vomiting. Patient not taking: Reported on 05/29/2018 07/03/17   Vanessa Kick, MD  Prenatal Vit-Fe Fumarate-FA (PREPLUS) 27-1 MG TABS Take 1 tablet by mouth daily. 01/12/18   [provider]  testosterone cypionate (DEPOTESTOSTERONE CYPIONATE) 200 MG/ML injection Inject 1 mL into the muscle every 14 (fourteen) days. 09/10/18   [provider]  venlafaxine XR  (EFFEXOR-XR) 150 MG 24 hr capsule Take 2 capsules (300 mg total) by mouth daily with breakfast. 09/16/18   Arfeen, Arlyce Harman, MD    Family History Family History  Problem Relation Age of Onset  . Diabetes Mother   . Diabetes Father   . Hypertension Father   . Heart attack Father 43       died age 98  . Alcohol abuse Father  Social History Social History   Tobacco Use  . Smoking status: Current Every Day Smoker    Packs/day: 2.00    Years: 38.00    Pack years: 76.00    Types: Cigarettes  . Smokeless tobacco: Never Used  . Tobacco comment: Has cut back to 1 pack a day  Substance Use Topics  . Alcohol use: No  . Drug use: No     Allergies   Celebrex [celecoxib], Hydrocodone, and Sulfa antibiotics   Review of Systems Review of Systems  Constitutional: Negative for chills and fever.  HENT: Negative for ear pain and sore throat.   Eyes: Negative for pain and visual disturbance.  Respiratory: Positive for cough and shortness of breath.   Cardiovascular: Negative for chest pain and palpitations.  Gastrointestinal: Positive for diarrhea and vomiting. Negative for abdominal pain.  Genitourinary: Negative for dysuria and hematuria.  Musculoskeletal: Negative for arthralgias and back pain.  Skin: Negative for color change and rash.  Neurological: Negative for seizures and syncope.  All other systems reviewed and are negative.    Physical Exam Updated Vital Signs BP (!) 152/90   Pulse 85   Temp 98.1 F (36.7 C) (Oral)   Resp (!) 24   SpO2 96%   Physical Exam Vitals signs and nursing note reviewed.  Constitutional:      Appearance: He is well-developed.  HENT:     Head: Normocephalic and atraumatic.  Eyes:     Conjunctiva/sclera: Conjunctivae normal.  Neck:     Musculoskeletal: Neck supple.  Cardiovascular:     Rate and Rhythm: Normal rate and regular rhythm.     Heart sounds: No murmur.  Pulmonary:     Effort: Pulmonary effort is normal. No respiratory  distress.     Comments: expiratory wheeze Abdominal:     Palpations: Abdomen is soft.     Tenderness: There is no abdominal tenderness.  Skin:    General: Skin is warm and dry.  Neurological:     Mental Status: He is alert.      ED Treatments / Results  Labs (all labs ordered are listed, but only abnormal results are displayed) Labs Reviewed  CBC WITH DIFFERENTIAL/PLATELET  BASIC METABOLIC PANEL  POC OCCULT BLOOD, ED  TROPONIN I (HIGH SENSITIVITY)    EKG None  Radiology Dg Chest Portable 1 View  Result Date: 12/10/2018 CLINICAL DATA:  Cough and shortness of breath EXAM: PORTABLE CHEST 1 VIEW COMPARISON:  October 19, 2018 FINDINGS: Lungs are clear. Heart size and pulmonary vascularity are normal. No adenopathy. No bone lesions. IMPRESSION: No edema or consolidation. Electronically Signed   By: Lowella Grip III M.D.   On: 12/10/2018 17:25    Procedures Procedures (including critical care time)  Medications Ordered in ED Medications  predniSONE (DELTASONE) tablet 40 mg (has no administration in time range)  albuterol (VENTOLIN HFA) 108 (90 Base) MCG/ACT inhaler 4 puff (has no administration in time range)     Initial Impression / Assessment and Plan / ED Course  I have reviewed the triage vital signs and the nursing notes.  Pertinent labs & imaging results that were available during my care of the patient were reviewed by me and considered in my medical decision making (see chart for details).        53 year old male presents to ER with productive cough, more shortness of breath.  Prior history of COPD.  On exam noted to have his expiratory wheeze but was in no respiratory distress with normal  vital signs.  Chest x-ray negative for pneumonia.  Provided inhaler, will give course of steroids, Z-Pak and recommend recheck with PCP this week.  Will swab for COVID-19.  Believe appropriate for outpatient management this time.  After the discussed management above, the  patient was determined to be safe for discharge.  The patient was in agreement with this plan and all questions regarding their care were answered.  ED return precautions were discussed and the patient will return to the ED with any significant worsening of condition.    Final Clinical Impressions(s) / ED Diagnoses   Final diagnoses:  COPD exacerbation [facility])    ED Discharge Orders    None       Lucrezia Starch, MD 12/11/18 [phone]

## 2018-12-12 DIAGNOSIS — 419620001 Death: Secondary | SNOMED CT | POA: Diagnosis not present

## 2018-12-12 LAB — NOVEL CORONAVIRUS, NAA (HOSP ORDER, SEND-OUT TO REF LAB; TAT 18-24 HRS): SARS-CoV-2, NAA: NOT DETECTED

## 2018-12-12 DEATH — deceased

## 2018-12-17 ENCOUNTER — Other Ambulatory Visit: Payer: Self-pay

## 2018-12-17 ENCOUNTER — Ambulatory Visit (HOSPITAL_COMMUNITY): Payer: No Typology Code available for payment source | Admitting: Psychiatry

## 2019-01-16 ENCOUNTER — Ambulatory Visit: Payer: No Typology Code available for payment source | Admitting: Registered"

## 2019-01-20 ENCOUNTER — Other Ambulatory Visit (HOSPITAL_COMMUNITY): Payer: Self-pay | Admitting: Psychiatry

## 2019-01-20 ENCOUNTER — Other Ambulatory Visit: Payer: Self-pay | Admitting: Cardiology

## 2019-01-20 DIAGNOSIS — F4312 Post-traumatic stress disorder, chronic: Secondary | ICD-10-CM

## 2019-01-23 ENCOUNTER — Telehealth (HOSPITAL_COMMUNITY): Payer: Self-pay

## 2019-01-23 NOTE — Telephone Encounter (Signed)
[employer] PRESCRIPTION COVERAGE APPROVED   ARIPIPRAZOLE 5MG  TABLET PA# [id number] EFFECTIVE 01/23/2019 TO 01/23/2020

## 2019-01-27 ENCOUNTER — Other Ambulatory Visit (HOSPITAL_COMMUNITY): Payer: Self-pay | Admitting: Psychiatry

## 2019-01-27 DIAGNOSIS — F4312 Post-traumatic stress disorder, chronic: Secondary | ICD-10-CM

## 2019-01-30 ENCOUNTER — Other Ambulatory Visit (HOSPITAL_COMMUNITY): Payer: Self-pay | Admitting: Psychiatry

## 2019-01-30 ENCOUNTER — Telehealth (HOSPITAL_COMMUNITY): Payer: Self-pay | Admitting: *Deleted

## 2019-01-30 DIAGNOSIS — F4312 Post-traumatic stress disorder, chronic: Secondary | ICD-10-CM

## 2019-01-30 NOTE — Telephone Encounter (Signed)
Pt called requesting refill on [name]. I will make appointment for pt. Please advise on refill.

## 2019-02-04 ENCOUNTER — Encounter (HOSPITAL_COMMUNITY): Payer: Self-pay | Admitting: Psychiatry

## 2019-02-04 ENCOUNTER — Other Ambulatory Visit: Payer: Self-pay

## 2019-02-04 ENCOUNTER — Ambulatory Visit (INDEPENDENT_AMBULATORY_CARE_PROVIDER_SITE_OTHER): Payer: No Typology Code available for payment source | Admitting: Psychiatry

## 2019-02-04 DIAGNOSIS — R262 Difficulty in walking, not elsewhere classified: Secondary | ICD-10-CM | POA: Diagnosis not present

## 2019-02-04 DIAGNOSIS — F4312 Post-traumatic stress disorder, chronic: Secondary | ICD-10-CM | POA: Diagnosis not present

## 2019-02-04 DIAGNOSIS — M545 Low back pain: Secondary | ICD-10-CM | POA: Diagnosis not present

## 2019-02-04 DIAGNOSIS — F3181 Bipolar II disorder: Secondary | ICD-10-CM

## 2019-02-04 DIAGNOSIS — R200 Anesthesia of skin: Secondary | ICD-10-CM | POA: Diagnosis not present

## 2019-02-04 DIAGNOSIS — R202 Paresthesia of skin: Secondary | ICD-10-CM | POA: Diagnosis not present

## 2019-02-04 MED ORDER — LAMOTRIGINE 150 MG PO TABS: 300.0000 mg | ORAL_TABLET | ORAL | 2 refills | Status: DC

## 2019-02-04 MED ORDER — ARIPIPRAZOLE 5 MG PO TABS: 5.0000 mg | ORAL_TABLET | Freq: Every day | ORAL | 2 refills | Status: DC

## 2019-02-04 MED ORDER — VENLAFAXINE HCL ER 150 MG PO CP24: 300.0000 mg | ORAL_CAPSULE | Freq: Every day | ORAL | 2 refills | Status: DC

## 2019-02-04 MED ORDER — MIRTAZAPINE 30 MG PO TABS: 30.0000 mg | ORAL_TABLET | Freq: Every day | ORAL | 2 refills | Status: DC

## 2019-02-04 NOTE — Progress Notes (Signed)
Virtual Visit via Telephone Note  I connected with Vincent [name] on 02/04/19 at  3:40 PM EST by telephone and verified that I am speaking with the correct person using two identifiers.   I discussed the limitations, risks, security and privacy concerns of performing an evaluation and management service by telephone and the availability of in person appointments. I also discussed with the patient that there may be a patient responsible charge related to this service. The patient expressed understanding and agreed to proceed.   History of Present Illness: Patient was evaluated by phone session. He had missed the last appointment. Patient told he has been feeling overwhelmed because of his health issues. He was admitted in the hospital because of chest pain and recently he fell and hurt his foot. He missed appointment with Vincent [name]. He lives with his wife who is supportive. Now he is out of his [name] and having insomnia. Patient is taking multiple medication but he believes his bipolar disorder is stable. He does not have severe mood swings, irritability, anger or any mania. He likes Abilify which is helping his mood and anger. Patient has chronic pain and multiple health issues. He is taking Lamictal and denies any rash or any itching. Sometime he has nightmares and flashback but when he takes the [name] it helps his sleep. He admitted he tends to forget things and sometimes he has difficulty in his attention and concentration. Patient denies drinking or using any illegal substances. His appetite is okay.   Past Psychiatric History:Reviewed. H/[place]one brief hospitalization at [facility] due to suicidal thoughts. No h/osuicidal attempt. [place], anxiety, PTSD and heavy substance use. 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 Tried Depakote, Zoloft, lithium, Prozac and Xanax.  Recent Results (from the past 2160 hour(s))  POC occult blood, ED     Status: None   Collection Time:  12/10/18  5:20 PM  Result Value Ref Range   Fecal Occult Bld NEGATIVE NEGATIVE  CBC with Differential     Status: None   Collection Time: 12/10/18  6:15 PM  Result Value Ref Range   WBC 7.1 4.0 - 10.5 K/uL   RBC 5.01 4.22 - 5.81 MIL/uL   Hemoglobin 14.8 13.0 - 17.0 g/dL   HCT 42.4 39.0 - 52.0 %   MCV 84.6 80.0 - 100.0 fL   MCH 29.5 26.0 - 34.0 pg   MCHC 34.9 30.0 - 36.0 g/dL   RDW 12.4 11.5 - 15.5 %   Platelets 210 150 - 400 K/uL   nRBC 0.0 0.0 - 0.2 %   Neutrophils Relative % 56 %   Neutro Abs 4.0 1.7 - 7.7 K/uL   Lymphocytes Relative 31 %   Lymphs Abs 2.2 0.7 - 4.0 K/uL   Monocytes Relative 11 %   Monocytes Absolute 0.8 0.1 - 1.0 K/uL   Eosinophils Relative 0 %   Eosinophils Absolute 0.0 0.0 - 0.5 K/uL   Basophils Relative 1 %   Basophils Absolute 0.1 0.0 - 0.1 K/uL   Immature Granulocytes 1 %   Abs Immature Granulocytes 0.05 0.00 - 0.07 K/uL    Comment: Performed at [facility], [place] [address] [place], [place] [id number]  Basic metabolic panel     Status: Abnormal   Collection Time: 12/10/18  6:15 PM  Result Value Ref Range   Sodium 139 135 - 145 mmol/L   Potassium 4.2 3.5 - 5.1 mmol/L   Chloride 106 98 - 111 mmol/L  CO2 22 22 - 32 mmol/L   Glucose, Bld 298 (H) 70 - 99 mg/dL   BUN 19 6 - 20 mg/dL   Creatinine, Ser 0.88 0.61 - 1.24 mg/dL   Calcium 9.3 8.9 - 10.3 mg/dL   GFR calc non Af Amer >60 >60 mL/min   GFR calc Af Amer >60 >60 mL/min   Anion gap 11 5 - 15    Comment: Performed at [facility], [place] [address] [place], [place] [address]  Troponin I (High Sensitivity)     Status: None   Collection Time: 12/10/18  6:15 PM  Result Value Ref Range   Troponin I (High Sensitivity) <2 <18 ng/L    Comment: (NOTE) Elevated high sensitivity troponin I (hsTnI) values and significant  changes across serial measurements may suggest ACS but many other  chronic and acute conditions are known to elevate hsTnI results.  Refer to the  Links section for chest pain algorithms and additional  guidance. Performed at [facility], [place] [address], [place], [place] [address]   CBG monitoring, ED     Status: Abnormal   Collection Time: 12/10/18  7:44 PM  Result Value Ref Range   Glucose-Capillary 265 (H) 70 - 99 mg/dL  Novel Coronavirus, NAA (Hosp order, Send-out to Ref Lab; TAT 18-24 hrs     Status: None   Collection Time: 12/10/18  7:46 PM   Specimen: Nasopharyngeal Swab; Respiratory  Result Value Ref Range   SARS-CoV-2, NAA NOT DETECTED NOT DETECTED    Comment: (NOTE) This nucleic acid amplification test was developed and its performance characteristics determined by [employer]. Nucleic acid amplification tests include PCR and TMA. This test has not been FDA cleared or approved. This test has been authorized by FDA under an Emergency Use Authorization (EUA). This test is only authorized for the duration of time the declaration that circumstances exist justifying the authorization of the emergency use of in vitro diagnostic tests for detection of SARS-CoV-2 virus and/or diagnosis of COVID-19 infection under section 564(b)(1) of the Act, 21 U.S.C. PT:2852782) (1), unless the authorization is terminated or revoked sooner. When diagnostic testing is negative, the possibility of a false negative result should be considered in the context of a patient's recent exposures and the presence of clinical signs and symptoms consistent with COVID-19. An individual without symptoms of COVID- 19 and who is not shedding SARS-CoV-2 vi rus would expect to have a negative (not detected) result in this assay. Performed At: [facility] [place], [place] [id number] Rush Farmer MD [id number]    Coronavirus Source NASOPHARYNGEAL     Comment: Performed at [facility] [address], [place], [place] [address]     Psychiatric Specialty Exam: Physical Exam   ROS  There were no vitals taken for this visit.There is no height or weight on file to calculate BMI.  General Appearance: NA  Eye Contact:  NA  Speech:  Clear and Coherent and Slow  Volume:  Decreased  Mood:  Dysphoric  Affect:  NA  Thought Process:  Goal Directed  Orientation:  Full (Time, Place, and Person)  Thought Content:  WDL and Logical  Suicidal Thoughts:  No  Homicidal Thoughts:  No  Memory:  Immediate;   Fair Recent;   Good Remote;   Good  Judgement:  Good  Insight:  Fair  Psychomotor Activity:  NA  Concentration:  Concentration: Fair and Attention Span: Fair  Recall:  [employer]  of Knowledge:  Good  Language:  Good  Akathisia:  No  Handed:  Right  AIMS (if indicated):     Assets:  Communication Skills Desire for Improvement Housing Resilience Social Support  ADL's:  Intact  Cognition:  WNL  Sleep:   fair      Assessment and Plan: Bipolar disorder type I. Posttraumatic stress disorder. Generalized anxiety disorder.  I discussed his current medication and blood work results. He is taking multiple medication. He is reluctant to cut down his medication however after some encouragement he agreed to stop the BuSpar since he already taking Effexor and [name] which is also going to help his anxiety.. I encourage that he should consider therapy again with Vincent [name]. Continue Abilify 5 mg daily, Lamictal 300 mg daily, [name] 30 mg at bedtime. We will discontinue BuSpar for now until patient feels worsening of the symptoms. Recommended to call us back if is any question or any concern. Follow-up in 3 months.  Follow Up Instructions:    I discussed the assessment and treatment plan with the patient. The patient was provided an opportunity to ask questions and all were answered. The patient agreed with the plan and demonstrated an understanding of the instructions.   The patient was advised to call back or seek an in-person evaluation if the symptoms worsen or if  the condition fails to improve as anticipated.  I provided 20 minutes of non-face-to-face time during this encounter.   Kathlee Nations, MD

## 2019-02-19 ENCOUNTER — Other Ambulatory Visit: Payer: Self-pay

## 2019-02-20 DIAGNOSIS — M533 Sacrococcygeal disorders, not elsewhere classified: Secondary | ICD-10-CM | POA: Diagnosis not present

## 2019-02-21 ENCOUNTER — Ambulatory Visit (INDEPENDENT_AMBULATORY_CARE_PROVIDER_SITE_OTHER): Payer: No Typology Code available for payment source | Admitting: Endocrinology

## 2019-02-21 ENCOUNTER — Encounter: Payer: Self-pay | Admitting: Endocrinology

## 2019-02-21 ENCOUNTER — Other Ambulatory Visit: Payer: Self-pay

## 2019-02-21 VITALS — BP 140/70 | HR 93 | Ht 72.0 in | Wt 207.0 lb

## 2019-02-21 DIAGNOSIS — E119 Type 2 diabetes mellitus without complications: Secondary | ICD-10-CM | POA: Diagnosis not present

## 2019-02-21 LAB — POCT GLYCOSYLATED HEMOGLOBIN (HGB A1C): Hemoglobin A1C: 10.3 % — AB (ref 4.0–5.6)

## 2019-02-21 MED ORDER — TOUJEO MAX SOLOSTAR 300 UNIT/ML ~~LOC~~ SOPN: 300.0000 [IU] | PEN_INJECTOR | SUBCUTANEOUS | 11 refills | Status: DC

## 2019-02-21 MED ORDER — TRULICITY 0.75 MG/0.5ML ~~LOC~~ SOAJ: 0.7500 mg | SUBCUTANEOUS | 11 refills | Status: DC

## 2019-02-21 NOTE — Patient Instructions (Signed)
good diet and exercise significantly improve the control of your diabetes.  please let me know if you wish to be referred to a dietician.  high blood sugar is very risky to your health.  you should see an eye doctor and dentist every year.  It is very important to get all recommended vaccinations.  Controlling your blood pressure and cholesterol drastically reduces the damage diabetes does to your body.  Those who smoke should quit.  Please discuss these with your doctor.  check your blood sugar twice a day.  vary the time of day when you check, between before the 3 meals, and at bedtime.  also check if you have symptoms of your blood sugar being too high or too low.  please keep a record of the readings and bring it to your next appointment here (or you can bring the meter itself).  You can write it on any piece of paper.  please call us sooner if your blood sugar goes below 70, or if you have a lot of readings over 200. For now, please: Resume the Trulicity, and: Change both Basaglar and Lantus to "Toujeo," 300 units each morning. Please call or message [place] next week, to tell us how the blood sugar is doing. Please come back for a follow-up appointment in 1 month.   Please also see Vaughan Basta to consider a pump.

## 2019-02-21 NOTE — Progress Notes (Signed)
Subjective:    Patient ID: Vincent [name], male    DOB: 12-19-1965, 53 y.o.   MRN: [id number]  HPI pt is referred by Dr Darron Doom, for diabetes.  Pt states DM was dx'ed in 2000; he has moderate neuropathy of the lower extremities; he has associated TIA, CAD, and AN; he has been on insulin since 2005; pt says his diet and exercise are good; he has never had pancreatitis, pancreatic surgery, severe hypoglycemia or DKA.  He says cbg varies from 325-655. He had a steroid injection into the back yesterday, and also another 2 weeks ago.  However, pt says cbg is this high even not after the steriod injections  He wants to consider an insulin pump.  He takes Lantus and Basaglar, for a total of 260 units per day.  He no longer takes Humalog. He stopped Trulicity, due to lack of effect.     Past Medical History:  Diagnosis Date  . Anxiety   . Bilateral carpal tunnel syndrome   . Bipolar 1 disorder ([place])   . CAD (coronary artery disease)    Non obstructive on CTA Oct 2019.   Marland Kitchen Chronic pain syndrome    back  . Cold extremities    BLE  . COPD (chronic obstructive pulmonary disease) ([place])   . Cubital tunnel syndrome on right   . DDD (degenerative disc disease), lumbar   . Diabetic peripheral neuropathy ([place])   . Gastroparesis   . GERD (gastroesophageal reflux disease)   . Hiatal hernia   . History of bladder cancer urologist-  dr Consuella Lose   papillay TCC (Ta G1)  s/p TURBT and chemo instillation 2014  . History of bronchitis   . History of carpal tunnel syndrome    Bilateral  . History of chest pain 12/2017  . History of chest pain 12/2017   heart cath normal  . History of encephalopathy 05/27/2015   admission w/ acute encephalopathy thought to be secondary to pain meds and COPD  . History of gastric ulcer   . History of Helicobacter pylori infection   . History of kidney stones    not aware  . History of TIA (transient ischemic attack) 2008    no residual  . History of traumatic head injury  2010   w/ LOC  per pt needed stitches, hit in head with a mower blade  . Hyperlipidemia   . Hypertension   . Hypothyroidism   . Insomnia    per sleep study 04-19-2015 without sleep apnea  . Neuropathy in diabetes ([place])    LOWER EXTREMITIES  . PTSD (post-traumatic stress disorder)   . RA (rheumatoid arthritis) ([place])   . Seizures, transient [facility]) neurologist-  dr Krista Blue--  differential dx complex partial seizure .vs.  mood disorder .vs.  pseudoseizure--  negative EEG's   confusion episodes and staring spells since 11/ 2015  . Sleep apnea    Mild, NO CPAP ORDERED  . Transient confusion NEUOROLOGIST-  DR YAN   Episodes since 11/ 2015--  neurologist dx  differential complex partial seizure  .vs. mood disorder . vs. pseudoseizure  . Tremor   . Type 2 diabetes mellitus treated with insulin [facility])     Past Surgical History:  Procedure Laterality Date  . CARDIAC CATHETERIZATION  12-27-2001  DR Einar Gip  &  05-26-2009  DR Irish Lack   RESULTS FOR BOTH ARE NORMAL CORONARIES AND PERSERVED LVF/ EF 60%  . CARPAL TUNNEL RELEASE Right 09-16-2003  . CARPAL TUNNEL RELEASE Left 02/25/2015  Procedure: LEFT CARPAL TUNNEL RELEASE;  Surgeon: Leanora Cover, MD;  Location: [place];  Service: Orthopedics;  Laterality: Left;  . COLONOSCOPY  2014  . CYSTOSCOPY N/A 10/10/2012   Procedure: CYSTOSCOPY CLOT EVACUATION FULGERATION OF BLEEDERS ;  Surgeon: Claybon Jabs, MD;  Location: [facility];  Service: Urology;  Laterality: N/A;  . CYSTOSCOPY WITH BIOPSY N/A 11/26/2015   Procedure: CYSTOSCOPY WITH BIOPSY AND FULGURATION;  Surgeon: Kathie Rhodes, MD;  Location: [place];  Service: Urology;  Laterality: N/A;  . ESOPHAGOGASTRODUODENOSCOPY  2014  . LAPAROSCOPIC CHOLECYSTECTOMY  11-17-2010  . NEGATIVE SLEEP STUDY  04-19-2015  in epic  . ORCHIECTOMY Right 02/21/2016   Procedure: SCROTAL ORCHIECTOMY with TESTICULAR PROSTHESIS IMPLANT;  Surgeon: Kathie Rhodes, MD;  Location:  [facility];  Service: Urology;  Laterality: Right;  . ORCHIECTOMY Left 09/02/2018   Procedure: ORCHIECTOMY;  Surgeon: Kathie Rhodes, MD;  Location: [facility];  Service: Urology;  Laterality: Left;  . ROTATOR CUFF REPAIR Right 12/2004  . TRANSURETHRAL RESECTION OF BLADDER TUMOR N/A 08/09/2012   Procedure: TRANSURETHRAL RESECTION OF BLADDER TUMOR (TURBT) WITH GYRUS WITH MITOMYCIN C;  Surgeon: Claybon Jabs, MD;  Location: [facility];  Service: Urology;  Laterality: N/A;  . TRANSURETHRAL RESECTION OF BLADDER TUMOR WITH GYRUS (TURBT-GYRUS) N/A 02/27/2014   Procedure: TRANSURETHRAL RESECTION OF BLADDER TUMOR WITH GYRUS (TURBT-GYRUS);  Surgeon: Claybon Jabs, MD;  Location: [facility];  Service: Urology;  Laterality: N/A;    Social History   Socioeconomic History  . Marital status: Married    Spouse name: Not on file  . Number of children: 3  . Years of education: GED  . Highest education level: Not on file  Occupational History  . Occupation: Engineer, technical sales    Comment: Owner of company  Tobacco Use  . Smoking status: Current Every Day Smoker    Packs/day: 2.00    Years: 38.00    Pack years: 76.00    Types: Cigarettes  . Smokeless tobacco: Never Used  . Tobacco comment: Has cut back to 1 pack a day  Substance and Sexual Activity  . Alcohol use: No  . Drug use: No  . Sexual activity: Not Currently    Partners: Female    Birth control/protection: None  Other Topics Concern  . Not on file  Social History Narrative   Lives at home with his wife and children.   Left-handed.   3-4 cups caffeine per day.   Social Determinants of Health   Financial Resource Strain:   . Difficulty of Paying Living Expenses: Not on file  Food Insecurity:   . Worried About Charity fundraiser in the Last Year: Not on file  . Ran Out of Food in the Last Year: Not on file  Transportation Needs:   . Lack of Transportation  (Medical): Not on file  . Lack of Transportation (Non-Medical): Not on file  Physical Activity:   . Days of Exercise per Week: Not on file  . Minutes of Exercise per Session: Not on file  Stress:   . Feeling of Stress : Not on file  Social Connections:   . Frequency of Communication with Friends and Family: Not on file  . Frequency of Social Gatherings with Friends and Family: Not on file  . Attends Religious Services: Not on file  . Active Member of Clubs or Organizations: Not on file  . Attends Archivist  Meetings: Not on file  . Marital Status: Not on file  Intimate Partner Violence:   . Fear of Current or Ex-Partner: Not on file  . Emotionally Abused: Not on file  . Physically Abused: Not on file  . Sexually Abused: Not on file    Current Outpatient Medications on File Prior to Visit  Medication Sig Dispense Refill  . ARIPiprazole (ABILIFY) 5 MG tablet Take 1 tablet (5 mg total) by mouth daily. 30 tablet 2  . atorvastatin (LIPITOR) 40 MG tablet Take 1 tablet (40 mg total) by mouth daily at 6 PM. 30 tablet 1  . busPIRone (BUSPAR) 5 MG tablet Take 1 tablet (5 mg total) by mouth 2 (two) times daily. 60 tablet 2  . cyclobenzaprine (FLEXERIL) 10 MG tablet Take 1 tablet by mouth 3 times daily as needed for muscle spasm. Warning: May cause drowsiness. 21 tablet 0  . fentaNYL (DURAGESIC - DOSED MCG/HR) 75 MCG/HR Place 75 mcg onto the skin every 3 (three) days.     Marland Kitchen gabapentin (NEURONTIN) 600 MG tablet Take 600 mg by mouth 2 (two) times daily.    . hydroxychloroquine (PLAQUENIL) 200 MG tablet Take 200 mg by mouth 2 (two) times daily.     . isosorbide mononitrate (IMDUR) 120 MG 24 hr tablet Take 1 tablet by mouth once daily 90 tablet 0  . lamoTRIgine (LAMICTAL) 150 MG tablet Take 2 tablets (300 mg total) by mouth every morning. (Patient taking differently: Take 150 mg by mouth daily. ) 60 tablet 2  . levothyroxine (SYNTHROID, LEVOTHROID) 25 MCG tablet Take 25 mcg by mouth daily  before breakfast.     . metFORMIN (GLUCOPHAGE-XR) 500 MG 24 hr tablet Take 500 mg by mouth 2 (two) times daily.    . metoCLOPramide (REGLAN) 10 MG tablet Take 10 mg by mouth 4 (four) times daily.    . mirtazapine ([name]) 30 MG tablet Take 1 tablet (30 mg total) by mouth at bedtime. 30 tablet 2  . omeprazole (PRILOSEC) 20 MG capsule Take 20 mg by mouth every morning.     . pregabalin (LYRICA) 150 MG capsule Take 150 mg by mouth 2 (two) times daily.    . Prenatal Vit-Fe Fumarate-FA (PREPLUS) 27-1 MG TABS Take 1 tablet by mouth daily.  3  . rosuvastatin (CRESTOR) 10 MG tablet Take 10 mg by mouth daily.    . tamsulosin (FLOMAX) 0.4 MG CAPS capsule Take 0.4 mg by mouth daily.    Marland Kitchen venlafaxine XR (EFFEXOR-XR) 150 MG 24 hr capsule Take 2 capsules (300 mg total) by mouth daily with breakfast. (Patient taking differently: Take 150 mg by mouth daily with breakfast. ) 60 capsule 2  . albuterol (VENTOLIN HFA) 108 (90 Base) MCG/ACT inhaler Inhale 1-2 puffs into the lungs every 6 (six) hours as needed for wheezing or shortness of breath. (Patient not taking: Reported on 02/21/2019) 18 g 0  . Cholecalciferol (VITAMIN D3) 2000 units TABS TAKE 1 TABLET BY MOUTH ONCE DAILY (Patient not taking: No sig reported) 90 tablet 0  . clopidogrel (PLAVIX) 75 MG tablet Take 1 tablet (75 mg total) by mouth daily. (Patient not taking: Reported on 02/21/2019) 30 tablet 0  . fenofibrate 160 MG tablet Take 1 tablet (160 mg total) by mouth daily. (Patient not taking: Reported on 02/21/2019) 30 tablet 0  . metoprolol tartrate (LOPRESSOR) 25 MG tablet Take 1 tablet (25 mg total) by mouth 2 (two) times daily. (Patient not taking: Reported on 02/21/2019) 60 tablet 0  .  mupirocin ointment (BACTROBAN) 2 % Apply 1 application topically 2 (two) times daily. Apply to the affected area 2 times a day (Patient not taking: Reported on 02/21/2019) 22 g 3  . ondansetron (ZOFRAN-ODT) 4 MG disintegrating tablet Take 1 tablet (4 mg total) by mouth  every 8 (eight) hours as needed for nausea or vomiting. (Patient not taking: Reported on 02/21/2019) 15 tablet 0  . testosterone cypionate (DEPOTESTOSTERONE CYPIONATE) 200 MG/ML injection Inject 1 mL into the muscle every 14 (fourteen) days.     No current facility-administered medications on file prior to visit.    Allergies  Allergen Reactions  . Celebrex [Celecoxib] Anaphylaxis and Rash  . Hydrocodone Rash and Other (See Comments)    "blisters developed on arms"  . Sulfa Antibiotics Rash    Family History  Problem Relation Age of Onset  . Diabetes Mother   . Diabetes Father   . Hypertension Father   . Heart attack Father 72       died age 54  . Alcohol abuse Father     BP 140/70   Pulse 93   Ht 6' (1.829 m)   Wt 207 lb (93.9 kg)   SpO2 95%   BMI 28.07 kg/m     Review of Systems denies headache, chest pain, sob, n/v, excessive diaphoresis, memory loss, cold intolerance, rhinorrhea, and easy bruising.  He has blurry vision, leg cramps, and frequent urination.  He has lost 15 lbs x 3 mos.  Depression is well-controlled.      Objective:   Physical Exam VS: see vs page GEN: no distress HEAD: head: no deformity eyes: no periorbital swelling, no proptosis.   external nose and ears are normal NECK: supple, thyroid is not enlarged CHEST WALL: no deformity LUNGS: clear to auscultation CV: reg rate and rhythm, no murmur ABD: abdomen is soft, nontender.  no hepatosplenomegaly.  not distended.  no hernia MUSCULOSKELETAL: muscle bulk and strength are grossly normal.  no obvious joint swelling.  gait is normal and steady EXTEMITIES: no deformity.  no ulcer on the feet.  feet are of normal color and temp.  no edema.  Ext: there is bilateral onychomycosis of the toenails.   PULSES: dorsalis pedis intact bilat.  no carotid bruit NEURO:  cn 2-12 grossly intact.   readily moves all 4's.  sensation is intact to touch on the feet, but decreased from normal.   SKIN:  Normal texture  and temperature.  No rash or suspicious lesion is visible.   NODES:  None palpable at the neck.   PSYCH: alert, well-oriented.  Does not appear anxious nor depressed.    Lab Results  Component Value Date   HGBA1C 10.3 (A) 02/21/2019   I have reviewed outside records, and summarized: Pt was noted to have severely elevated a1c, and referred here.  HTN, TIA, and dyslipidemia were also addressed.    Lab Results  Component Value Date   CREATININE 0.88 12/10/2018   BUN 19 12/10/2018   NA 139 12/10/2018   K 4.2 12/10/2018   CL 106 12/10/2018   CO2 22 12/10/2018   I personally reviewed electrocardiogram tracing (12/10/18): Indication: sob Impression: NSR.  No MI.  No hypertrophy. Compared to 10/19/18: early replolarization is resolved      Assessment & Plan:  Insulin-requiring type 2 DM, with TIA, severe exacerbation   Patient Instructions  good diet and exercise significantly improve the control of your diabetes.  please let me know if you wish to be referred  to a dietician.  high blood sugar is very risky to your health.  you should see an eye doctor and dentist every year.  It is very important to get all recommended vaccinations.  Controlling your blood pressure and cholesterol drastically reduces the damage diabetes does to your body.  Those who smoke should quit.  Please discuss these with your doctor.  check your blood sugar twice a day.  vary the time of day when you check, between before the 3 meals, and at bedtime.  also check if you have symptoms of your blood sugar being too high or too low.  please keep a record of the readings and bring it to your next appointment here (or you can bring the meter itself).  You can write it on any piece of paper.  please call us sooner if your blood sugar goes below 70, or if you have a lot of readings over 200. For now, please: Resume the Trulicity, and: Change both Basaglar and Lantus to "[place]," 300 units each morning. Please call or  message [place] next week, to tell us how the blood sugar is doing. Please come back for a follow-up appointment in 1 month.   Please also see Vaughan Basta to consider a pump.

## 2019-03-04 ENCOUNTER — Ambulatory Visit
Admission: EM | Admit: 2019-03-04 | Discharge: 2019-03-04 | Disposition: A | Discharge status: Home / Self Care | Payer: No Typology Code available for payment source

## 2019-03-04 DIAGNOSIS — R296 Repeated falls: Secondary | ICD-10-CM

## 2019-03-04 DIAGNOSIS — G894 Chronic pain syndrome: Secondary | ICD-10-CM | POA: Diagnosis not present

## 2019-03-04 DIAGNOSIS — R29898 Other symptoms and signs involving the musculoskeletal system: Secondary | ICD-10-CM | POA: Diagnosis not present

## 2019-03-04 NOTE — ED Provider Notes (Signed)
[facility]    CSN: [id number] Arrival date & time: 03/04/19  1810      History   Chief Complaint Chief Complaint  Patient presents with  . Leg Pain    HPI Vincent [name] is a 53 y.o. male.   53 year old male comes in for chronic pain control. He has pain to the right lower extremity diffusely that he states has been worsening daily. Skin is sensitive throughout. Denies any rashes, swelling, redness, warmth. Denies saddle anesthesia, numbness/tingling, loss of bladder or bowel control. He states he has had frequent falls due to this pain, and has felt increased weakness to the extremity. He is being seen by pain clinic, and is waiting for referral to neurosurgery.   Received right sacroiliac joint injection from pain clinic on 02/20/2019. Normal MRI 02/04/2019.     Past Medical History:  Diagnosis Date  . Anxiety   . Bilateral carpal tunnel syndrome   . Bipolar 1 disorder ([place])   . CAD (coronary artery disease)    Non obstructive on CTA Oct 2019.   Marland Kitchen Chronic pain syndrome    back  . Cold extremities    BLE  . COPD (chronic obstructive pulmonary disease) ([place])   . Cubital tunnel syndrome on right   . DDD (degenerative disc disease), lumbar   . Diabetic peripheral neuropathy ([place])   . Gastroparesis   . GERD (gastroesophageal reflux disease)   . Hiatal hernia   . History of bladder cancer urologist-  dr Consuella Lose   papillay TCC (Ta G1)  s/p TURBT and chemo instillation 2014  . History of bronchitis   . History of carpal tunnel syndrome    Bilateral  . History of chest pain 12/2017  . History of chest pain 12/2017   heart cath normal  . History of encephalopathy 05/27/2015   admission w/ acute encephalopathy thought to be secondary to pain meds and COPD  . History of gastric ulcer   . History of Helicobacter pylori infection   . History of kidney stones    not aware  . History of TIA (transient ischemic attack) 2008    no residual  . History of  traumatic head injury 2010   w/ LOC  per pt needed stitches, hit in head with a mower blade  . Hyperlipidemia   . Hypertension   . Hypothyroidism   . Insomnia    per sleep study 04-19-2015 without sleep apnea  . Neuropathy in diabetes ([place])    LOWER EXTREMITIES  . PTSD (post-traumatic stress disorder)   . RA (rheumatoid arthritis) ([place])   . Seizures, transient [facility]) neurologist-  dr Krista Blue--  differential dx complex partial seizure .vs.  mood disorder .vs.  pseudoseizure--  negative EEG's   confusion episodes and staring spells since 11/ 2015  . Sleep apnea    Mild, NO CPAP ORDERED  . Transient confusion NEUOROLOGIST-  DR YAN   Episodes since 11/ 2015--  neurologist dx  differential complex partial seizure  .vs. mood disorder . vs. pseudoseizure  . Tremor   . Type 2 diabetes mellitus treated with insulin [facility])     Patient Active Problem List   Diagnosis Date Noted  . TIA (transient ischemic attack) 10/20/2018  . Uncontrolled type 2 DM with hyperosmolar nonketotic hyperglycemia ([place]) 10/19/2018  . Left sided numbness 10/19/2018  . Diabetic autonomic neuropathy associated with type 2 diabetes mellitus ([place]) 02/01/2018  . DM type 2 with diabetic peripheral neuropathy ([place]) 02/01/2018  .  Dyslipidemia 01/31/2018  . Coronary artery disease involving native coronary artery of native heart without angina pectoris 01/31/2018  . Chest pain 01/01/2018  . Seizure disorder ([place]) 12/19/2016  . Essential tremor 12/19/2016  . Polypharmacy 12/19/2016  . Chronic post-traumatic stress disorder (PTSD) 06/16/2016  . Tremor 06/16/2016  . Tobacco use disorder 05/05/2016  . Psychophysiological insomnia 06/02/2015  . Altered mental status 05/28/2015  . Acute bronchitis 05/28/2015  . Type 2 diabetes mellitus with hyperglycemia ([place]) 05/28/2015  . Hypothyroidism 05/28/2015  . Bipolar 2 disorder ([place]) 05/28/2015  . History of rheumatoid arthritis 05/28/2015  . Chronic pain 05/28/2015  . Depression  03/18/2015  . Diabetes ([place]) 01/23/2015  . Obesity (BMI 30-39.9) 03/06/2014  . Acute encephalopathy 03/05/2014  . HLD (hyperlipidemia) 03/05/2014  . Anemia, normocytic normochromic 03/05/2014  . Altered mental state   . Protein-calorie malnutrition, severe ([place]) 10/09/2012  . Bladder tumor 08/08/2012  . Biliary dyskinesia 11/02/2010   Past Surgical History:  Procedure Laterality Date  . CARDIAC CATHETERIZATION  12-27-2001  DR Einar Gip  &  05-26-2009  DR Irish Lack   RESULTS FOR BOTH ARE NORMAL CORONARIES AND PERSERVED LVF/ EF 60%  . CARPAL TUNNEL RELEASE Right 09-16-2003  . CARPAL TUNNEL RELEASE Left 02/25/2015   Procedure: LEFT CARPAL TUNNEL RELEASE;  Surgeon: Leanora Cover, MD;  Location: [place];  Service: Orthopedics;  Laterality: Left;  . COLONOSCOPY  2014  . CYSTOSCOPY N/A 10/10/2012   Procedure: CYSTOSCOPY CLOT EVACUATION FULGERATION OF BLEEDERS ;  Surgeon: Claybon Jabs, MD;  Location[facility];  Service: Urology;  Laterality: N/A;  . CYSTOSCOPY WITH BIOPSY N/A 11/26/2015   Procedure: CYSTOSCOPY WITH BIOPSY AND FULGURATION;  Surgeon: Kathie Rhodes, MD;  Location: [place];  Service: Urology;  Laterality: N/A;  . ESOPHAGOGASTRODUODENOSCOPY  2014  . LAPAROSCOPIC CHOLECYSTECTOMY  11-17-2010  . NEGATIVE SLEEP STUDY  04-19-2015  in epic  . ORCHIECTOMY Right 02/21/2016   Procedure: SCROTAL ORCHIECTOMY with TESTICULAR PROSTHESIS IMPLANT;  Surgeon: Kathie Rhodes, MD;  Location: [facility];  Service: Urology;  Laterality: Right;  . ORCHIECTOMY Left 09/02/2018   Procedure: ORCHIECTOMY;  Surgeon: Kathie Rhodes, MD;  Location: [facility];  Service: Urology;  Laterality: Left;  . ROTATOR CUFF REPAIR Right 12/2004  . TRANSURETHRAL RESECTION OF BLADDER TUMOR N/A 08/09/2012   Procedure: TRANSURETHRAL RESECTION OF BLADDER TUMOR (TURBT) WITH GYRUS WITH MITOMYCIN C;  Surgeon: Claybon Jabs, MD;  Location: [facility];  Service: Urology;  Laterality: N/A;  . TRANSURETHRAL RESECTION OF BLADDER TUMOR WITH GYRUS (TURBT-GYRUS) N/A 02/27/2014   Procedure: TRANSURETHRAL RESECTION OF BLADDER TUMOR WITH GYRUS (TURBT-GYRUS);  Surgeon: Claybon Jabs, MD;  Location: [facility];  Service: Urology;  Laterality: N/A;     Home Medications    Prior to Admission medications   Medication Sig Start Date End Date Taking? Authorizing Provider  albuterol (VENTOLIN HFA) 108 (90 Base) MCG/ACT inhaler Inhale 1-2 puffs into the lungs every 6 (six) hours as needed for wheezing or shortness of breath. Patient not taking: Reported on 02/21/2019 12/10/18   Lucrezia Starch, MD  ARIPiprazole (ABILIFY) 5 MG tablet Take 1 tablet (5 mg total) by mouth daily. 02/04/19   Arfeen, Arlyce Harman, MD  atorvastatin (LIPITOR) 40 MG tablet Take 1 tablet (40 mg total) by mouth daily at 6 PM. 05/06/16   Pucilowska, Jolanta B, MD  busPIRone (BUSPAR) 5 MG tablet Take 1 tablet (5 mg total) by mouth 2 (  two) times daily. 09/16/18 09/16/19  Kathlee Nations, MD  Cholecalciferol (VITAMIN D3) 2000 units TABS TAKE 1 TABLET BY MOUTH ONCE DAILY Patient not taking: No sig reported 10/10/17   Aundra Dubin, MD  clopidogrel (PLAVIX) 75 MG tablet Take 1 tablet (75 mg total) by mouth daily. Patient not taking: Reported on 02/21/2019 10/21/18   Mariel Aloe, MD  cyclobenzaprine (FLEXERIL) 10 MG tablet Take 1 tablet by mouth 3 times daily as needed for muscle spasm. Warning: May cause drowsiness. 03/16/18   Vanessa Kick, MD  Dulaglutide (TRULICITY) A999333 0000000 SOPN Inject 0.75 mg into the skin once a week. 02/21/19   Renato Shin, MD  fenofibrate 160 MG tablet Take 1 tablet (160 mg total) by mouth daily. Patient not taking: Reported on 02/21/2019 01/03/18 10/19/18  Elodia Florence., MD  fentaNYL (DURAGESIC - DOSED MCG/HR) 75 MCG/HR Place 75 mcg onto the skin every 3 (three) days.     [provider]  gabapentin (NEURONTIN)  600 MG tablet Take 600 mg by mouth 2 (two) times daily. 08/20/18   [provider]  hydroxychloroquine (PLAQUENIL) 200 MG tablet Take 200 mg by mouth 2 (two) times daily.     [provider]  Insulin Glargine, 2 Unit Dial, (TOUJEO MAX SOLOSTAR) 300 UNIT/ML SOPN Inject 300 Units into the skin every morning. And pen needles 1/day 02/21/19   Renato Shin, MD  isosorbide mononitrate (IMDUR) 120 MG 24 hr tablet Take 1 tablet by mouth once daily 01/20/19   Minus Breeding, MD  lamoTRIgine (LAMICTAL) 150 MG tablet Take 2 tablets (300 mg total) by mouth every morning. Patient taking differently: Take 150 mg by mouth daily.  02/04/19   Arfeen, Arlyce Harman, MD  levothyroxine (SYNTHROID, LEVOTHROID) 25 MCG tablet Take 25 mcg by mouth daily before breakfast.     [provider]  metFORMIN (GLUCOPHAGE-XR) 500 MG 24 hr tablet Take 500 mg by mouth 2 (two) times daily. 08/24/18   [provider]  metoCLOPramide (REGLAN) 10 MG tablet Take 10 mg by mouth 4 (four) times daily.    [provider]  metoprolol tartrate (LOPRESSOR) 25 MG tablet Take 1 tablet (25 mg total) by mouth 2 (two) times daily. Patient not taking: Reported on 02/21/2019 01/03/18 02/02/18  Elodia Florence., MD  mirtazapine ([name]) 30 MG tablet Take 1 tablet (30 mg total) by mouth at bedtime. 02/04/19   Arfeen, Arlyce Harman, MD  mupirocin ointment (BACTROBAN) 2 % Apply 1 application topically 2 (two) times daily. Apply to the affected area 2 times a day Patient not taking: Reported on 02/21/2019 01/24/18   Newt Minion, MD  omeprazole (PRILOSEC) 20 MG capsule Take 20 mg by mouth every morning.     [provider]  ondansetron (ZOFRAN-ODT) 4 MG disintegrating tablet Take 1 tablet (4 mg total) by mouth every 8 (eight) hours as needed for nausea or vomiting. Patient not taking: Reported on 02/21/2019 07/03/17   Vanessa Kick, MD  pregabalin (LYRICA) 150 MG capsule Take 150 mg by mouth 2 (two) times daily.     [provider]  Prenatal Vit-Fe Fumarate-FA (PREPLUS) 27-1 MG TABS Take 1 tablet by mouth daily. 01/12/18   [provider]  rosuvastatin (CRESTOR) 10 MG tablet Take 10 mg by mouth daily.    [provider]  tamsulosin (FLOMAX) 0.4 MG CAPS capsule Take 0.4 mg by mouth daily.    [provider]  testosterone cypionate (DEPOTESTOSTERONE CYPIONATE) 200 MG/ML injection Inject  1 mL into the muscle every 14 (fourteen) days. 09/10/18   [provider]  venlafaxine XR (EFFEXOR-XR) 150 MG 24 hr capsule Take 2 capsules (300 mg total) by mouth daily with breakfast. Patient taking differently: Take 150 mg by mouth daily with breakfast.  02/04/19   Arfeen, Arlyce Harman, MD   Family History Family History  Problem Relation Age of Onset  . Diabetes Mother   . Diabetes Father   . Hypertension Father   . Heart attack Father 58       died age 26  . Alcohol abuse Father    Social History Social History   Tobacco Use  . Smoking status: Current Every Day Smoker    Packs/day: 2.00    Years: 38.00    Pack years: 76.00    Types: Cigarettes  . Smokeless tobacco: Never Used  . Tobacco comment: Has cut back to 1 pack a day  Substance Use Topics  . Alcohol use: No  . Drug use: No   Allergies   Celebrex [celecoxib], Hydrocodone, and Sulfa antibiotics  Review of Systems Review of Systems  Reason unable to perform ROS: See HPI as above.   Physical Exam Triage Vital Signs ED Triage Vitals  Enc Vitals Group     BP 03/04/19 1818 111/70     Pulse Rate 03/04/19 1818 (!) 105     Resp 03/04/19 1818 (!) 22     Temp 03/04/19 1818 97.7 F (36.5 C)     Temp Source 03/04/19 1818 Oral     SpO2 03/04/19 1818 97 %     Weight --      Height --      Head Circumference --      Peak Flow --      Pain Score 03/04/19 1821 8     Pain Loc --      Pain Edu? --      Excl. in [place]? --    No data found.  Updated Vital Signs BP 111/70 (BP Location: Left Arm)   Pulse (!)  105   Temp 97.7 F (36.5 C) (Oral)   Resp (!) 22   SpO2 97%   Physical Exam Constitutional:      General: He is not in acute distress.    Appearance: He is well-developed. He is not diaphoretic.  HENT:     Head: Normocephalic and atraumatic.  Eyes:     Conjunctiva/sclera: Conjunctivae normal.     Pupils: Pupils are equal, round, and reactive to light.  Pulmonary:     Effort: Pulmonary effort is normal. No respiratory distress.  Musculoskeletal:     Comments: No obvious skin changes, swelling, rashes, erythema, warmth. No tenderness to spinous processes. States skin is "sensitive" with palpation, however, able to tolerate palpation of back, hips, lower extremity. No significant calf pain. Decreased active ROM of right hip. Full passive ROM. Strength 3/5 of hip, knees. Sensation intact, states  "sensitive" compared to left. Negative straight leg raise  Skin:    General: Skin is warm and dry.  Neurological:     Mental Status: He is alert and oriented to person, place, and time.      UC Treatments / Results  Labs (all labs ordered are listed, but only abnormal results are displayed) Labs Reviewed - No data to display  EKG   Radiology No results found.  Procedures Procedures (including critical care time)  Medications Ordered in UC Medications - No data to display  Initial Impression /  Assessment and Plan / UC Course  I have reviewed the triage vital signs and the nursing notes.  Pertinent labs & imaging results that were available during my care of the patient were reviewed by me and considered in my medical decision making (see chart for details).    Case discussed with Dr Meda Coffee. Chronic pain to RLE with frequent falls and weakness to leg. No obvious new radicular symptoms. No saddle anesthesia, loss of bladder or bowel control. Patient expressed interest in trial of muscle relaxant. Discussed due to frequent falls, uncomfortable adding muscle relaxant. No acute  interventions needed at this time. Will have patient follow up with pain clinic for further management needed.  Final Clinical Impressions(s) / UC Diagnoses   Final diagnoses:  Chronic pain syndrome  Right leg weakness   ED Prescriptions    None     I have reviewed the PDMP during this encounter.   Ok Edwards, PA-C 03/04/19 1901

## 2019-03-04 NOTE — Discharge Instructions (Signed)
I'm sorry that your pain is not controlled. However, no alarming signs at this time. Please follow up with pain management for further management needed.

## 2019-03-04 NOTE — ED Triage Notes (Signed)
Pt c/o rt leg pain, weakness and falling x6 wks. States has had a negative MRI, tx with steroid shot with no relief. States the pain unbearable

## 2019-03-18 ENCOUNTER — Ambulatory Visit: Payer: Self-pay

## 2019-03-18 ENCOUNTER — Other Ambulatory Visit (HOSPITAL_COMMUNITY): Payer: Self-pay | Admitting: Family Medicine

## 2019-03-18 ENCOUNTER — Other Ambulatory Visit: Payer: Self-pay

## 2019-03-18 ENCOUNTER — Ambulatory Visit (INDEPENDENT_AMBULATORY_CARE_PROVIDER_SITE_OTHER): Payer: No Typology Code available for payment source | Admitting: Orthopedic Surgery

## 2019-03-18 ENCOUNTER — Telehealth: Payer: Self-pay | Admitting: Cardiology

## 2019-03-18 DIAGNOSIS — I700 Atherosclerosis of aorta: Secondary | ICD-10-CM

## 2019-03-18 DIAGNOSIS — M5416 Radiculopathy, lumbar region: Secondary | ICD-10-CM

## 2019-03-18 DIAGNOSIS — M79651 Pain in right thigh: Secondary | ICD-10-CM

## 2019-03-18 DIAGNOSIS — M79604 Pain in right leg: Secondary | ICD-10-CM

## 2019-03-18 DIAGNOSIS — M25561 Pain in right knee: Secondary | ICD-10-CM

## 2019-03-18 DIAGNOSIS — R29898 Other symptoms and signs involving the musculoskeletal system: Secondary | ICD-10-CM

## 2019-03-18 NOTE — Telephone Encounter (Signed)
New Message  Patient's wife is calling in stating that she missed a call from [place] from phone number [phone]. The call was in reference to getting some tests for patient scheduled. There are no orders or active requests on the chart to be able to schedule anything. If called, please give patient's wife a call back and assist with getting orders on the chart if necessary.

## 2019-03-18 NOTE — Telephone Encounter (Signed)
Appointment has been made for 03/19/19 at 2:00.  Per DPR it is OK to leave message on voicemail.  I left message with appointment time and location on this voicemail.

## 2019-03-18 NOTE — Telephone Encounter (Signed)
Our billing department contacted patient's wife and this was not a billing question. I spoke with patient's wife who reports Dr Darron Doom ordered vein study to be done in our office.  I do not see this referral but will follow up with PV Department

## 2019-03-18 NOTE — Telephone Encounter (Signed)
This number is for the billing department. Will forward to billing

## 2019-03-19 ENCOUNTER — Encounter: Payer: Self-pay | Admitting: Orthopedic Surgery

## 2019-03-19 ENCOUNTER — Other Ambulatory Visit: Payer: Self-pay

## 2019-03-19 ENCOUNTER — Ambulatory Visit (HOSPITAL_COMMUNITY)
Admission: RE | Admit: 2019-03-19 | Discharge: 2019-03-19 | Disposition: A | Discharge status: Home / Self Care | Payer: No Typology Code available for payment source | Source: Ambulatory Visit | Attending: Cardiology | Admitting: Cardiology

## 2019-03-19 DIAGNOSIS — M79604 Pain in right leg: Secondary | ICD-10-CM | POA: Insufficient documentation

## 2019-03-19 DIAGNOSIS — R29898 Other symptoms and signs involving the musculoskeletal system: Secondary | ICD-10-CM | POA: Insufficient documentation

## 2019-03-19 DIAGNOSIS — I700 Atherosclerosis of aorta: Secondary | ICD-10-CM | POA: Diagnosis not present

## 2019-03-19 MED ORDER — METHYLPREDNISOLONE ACETATE 40 MG/ML IJ SUSP
40.0000 mg | INTRAMUSCULAR | Status: AC | PRN
  Administered 2019-03-19: 09:00:00 40 mg via INTRA_ARTICULAR

## 2019-03-19 MED ORDER — LIDOCAINE HCL 1 % IJ SOLN
5.0000 mL | INTRAMUSCULAR | Status: AC | PRN
  Administered 2019-03-19: 09:00:00 5 mL

## 2019-03-19 NOTE — Progress Notes (Signed)
Office Visit Note   Patient: Vincent [name]           Date of Birth: 1965/08/22           MRN: [id number] Visit Date: 03/18/2019              Requested by: Hayden Rasmussen, MD [address] [place],  [place] [id number] PCP: Hayden Rasmussen, MD  Chief Complaint  Patient presents with  . Right Leg - Pain  . Right Knee - Pain      HPI: The patient is a 54 year old gentleman who is seen today for initial evaluation of right thigh and knee pain.  This is been ongoing for about 2 months.  States it started suddenly he did not have any associated injury no falls he has been having anterior medial knee pain as well as anterior thigh pain describes as shooting and burning.  He states this prevents him from getting from a seated to a standing position as well as prevent him from walking very well gait instability feels the knee often gives out due to exquisite pain. his leg gives out. he has had several falls, feels he is dragging the RLE. there is associated burning  Of note the patient is currently in pain management has known lumbar spine issues he is recently had right sacroiliac joint injection on December 10 of 2020.  He reports that he had a lumbar spine MRI in November that was "normal."  Review of the chart shows MRI of his lumbar spine done at [facility] report reads normal at all levels which is unusual as he has had prior MRIs in the [facility] system which were revealing for pathology.  An MRI scan of his lumbar spine from 2017 showed: IMPRESSION: No change since previous exams. Minimal, grossly non-compressive degenerative changes at L3-4, L4-5 and L5-S1.  He does have a neurosurgery appointment scheduled this is not till later this month.   Assessment & Plan: Visit Diagnoses:  1. Right knee pain, unspecified chronicity     Plan: Radiographs of his knee were unrevealing.  Doubt any gouty or bony source for his pain as relates to his right knee.  Feels strongly that his  symptoms are coming from his lumbar spine.  States his last ESI in December was not helpful to him at all.  Did provide Depo-Medrol injection of the right knee.  Attempted to aspirate knee, unsuccessful.   Will wait till he has had his follow-up with neurosurgery to consider next steps.  Offered referral to our physiatrist for evaluation and possible ESI of his lumbar spine patient declined at this time.  Offered to send the patient to see our spine surgeons.  He would like to wait until he is seen his neurosurgeon as scheduled.  Did note the unusual finding of a completely negative MRI in 2020 however having had MRIs of his lumbar spine in 2014 and 2017 with findings.  There is no orthopedic problem of his knee or thigh as I can gather to find a reason for his symptoms.  Follow-Up Instructions: Return in about 4 weeks (around 04/15/2019), or if symptoms worsen or fail to improve.   Right Knee Exam   Tenderness  The patient is experiencing tenderness in the medial joint line.  Range of Motion  The patient has normal right knee ROM.  Tests  Varus: negative Valgus: negative Drawer:  Anterior - negative    Posterior - negative  Other  Erythema: absent Swelling: mild Effusion: no effusion present   Back Exam   Muscle Strength  Right Quadriceps:  3/5  Left Quadriceps:  5/5   Tests  Straight leg raise right: positive Straight leg raise left: negative      Patient is alert, oriented, no adenopathy, well-dressed, normal affect, normal respiratory effort.   Imaging: No results found. No images are attached to the encounter.  Labs: Lab Results  Component Value Date   HGBA1C 10.3 (A) 02/21/2019   HGBA1C 11.4 (H) 10/20/2018   HGBA1C 11.3 (H) 10/19/2018   REPTSTATUS 03/06/2014 FINAL 03/04/2014   CULT NO GROWTH Performed at [facility]  03/04/2014     Lab Results  Component Value Date   ALBUMIN 4.2 10/19/2018   ALBUMIN 3.9 05/17/2017   ALBUMIN 3.7 05/28/2015      Lab Results  Component Value Date   MG 2.2 01/03/2018   Lab Results  Component Value Date   VD25OH 22.5 (L) 11/22/2016    No results found for: PREALBUMIN CBC EXTENDED Latest Ref Rng & Units 12/10/2018 10/19/2018 10/19/2018  WBC 4.0 - 10.5 K/uL 7.1 8.8 -  RBC 4.22 - 5.81 MIL/uL 5.01 5.78 -  HGB 13.0 - 17.0 g/dL 14.8 17.1(H) 17.0  HCT 39.0 - 52.0 % 42.4 48.6 50.0  PLT 150 - 400 K/uL 210 243 -  NEUTROABS 1.7 - 7.7 K/uL 4.0 5.9 -  LYMPHSABS 0.7 - 4.0 K/uL 2.2 2.3 -     There is no height or weight on file to calculate BMI.  Orders:  Orders Placed This Encounter  Procedures  . XR Knee 1-2 Views Right   No orders of the defined types were placed in this encounter.    Procedures: Large Joint Inj on 03/19/2019 8:37 AM Indications: pain Details: 18 G 1.5 in needle, superolateral approach Medications: 5 mL lidocaine 1 %; 40 mg methylPREDNISolone acetate 40 MG/ML Aspirate: 0 mL Consent was given by the patient.      Clinical Data: No additional findings.  ROS:  All other systems negative, except as noted in the HPI. Review of Systems  Constitutional: Negative for chills, fatigue and fever.  Musculoskeletal: Positive for arthralgias, back pain, gait problem, joint swelling and myalgias.  Neurological: Positive for weakness. Negative for numbness.    Objective: Vital Signs: There were no vitals taken for this visit.  Specialty Comments:  No specialty comments available.  PMFS History: Patient Active Problem List   Diagnosis Date Noted  . TIA (transient ischemic attack) 10/20/2018  . Uncontrolled type 2 DM with hyperosmolar nonketotic hyperglycemia ([place]) 10/19/2018  . Left sided numbness 10/19/2018  . Diabetic autonomic neuropathy associated with type 2 diabetes mellitus ([place]) 02/01/2018  . DM type 2 with diabetic peripheral neuropathy ([place]) 02/01/2018  . Dyslipidemia 01/31/2018  . Coronary artery disease involving native coronary artery of native heart without  angina pectoris 01/31/2018  . Chest pain 01/01/2018  . Seizure disorder ([place]) 12/19/2016  . Essential tremor 12/19/2016  . Polypharmacy 12/19/2016  . Chronic post-traumatic stress disorder (PTSD) 06/16/2016  . Tremor 06/16/2016  . Tobacco use disorder 05/05/2016  . Psychophysiological insomnia 06/02/2015  . Altered mental status 05/28/2015  . Acute bronchitis 05/28/2015  . Type 2 diabetes mellitus with hyperglycemia ([place]) 05/28/2015  . Hypothyroidism 05/28/2015  . Bipolar 2 disorder ([place]) 05/28/2015  . History of rheumatoid arthritis 05/28/2015  . Chronic pain 05/28/2015  . Depression 03/18/2015  . Diabetes ([place]) 01/23/2015  . Obesity (BMI 30-39.9) 03/06/2014  . Acute  encephalopathy 03/05/2014  . HLD (hyperlipidemia) 03/05/2014  . Anemia, normocytic normochromic 03/05/2014  . Altered mental state   . Protein-calorie malnutrition, severe ([place]) 10/09/2012  . Bladder tumor 08/08/2012  . Biliary dyskinesia 11/02/2010   Past Medical History:  Diagnosis Date  . Anxiety   . Bilateral carpal tunnel syndrome   . Bipolar 1 disorder ([place])   . CAD (coronary artery disease)    Non obstructive on CTA Oct 2019.   Marland Kitchen Chronic pain syndrome    back  . Cold extremities    BLE  . COPD (chronic obstructive pulmonary disease) ([place])   . Cubital tunnel syndrome on right   . DDD (degenerative disc disease), lumbar   . Diabetic peripheral neuropathy ([place])   . Gastroparesis   . GERD (gastroesophageal reflux disease)   . Hiatal hernia   . History of bladder cancer urologist-  dr Consuella Lose   papillay TCC (Ta G1)  s/p TURBT and chemo instillation 2014  . History of bronchitis   . History of carpal tunnel syndrome    Bilateral  . History of chest pain 12/2017  . History of chest pain 12/2017   heart cath normal  . History of encephalopathy 05/27/2015   admission w/ acute encephalopathy thought to be secondary to pain meds and COPD  . History of gastric ulcer   . History of Helicobacter pylori  infection   . History of kidney stones    not aware  . History of TIA (transient ischemic attack) 2008    no residual  . History of traumatic head injury 2010   w/ LOC  per pt needed stitches, hit in head with a mower blade  . Hyperlipidemia   . Hypertension   . Hypothyroidism   . Insomnia    per sleep study 04-19-2015 without sleep apnea  . Neuropathy in diabetes ([place])    LOWER EXTREMITIES  . PTSD (post-traumatic stress disorder)   . RA (rheumatoid arthritis) ([place])   . Seizures, transient [facility]) neurologist-  dr Krista Blue--  differential dx complex partial seizure .vs.  mood disorder .vs.  pseudoseizure--  negative EEG's   confusion episodes and staring spells since 11/ 2015  . Sleep apnea    Mild, NO CPAP ORDERED  . Transient confusion NEUOROLOGIST-  DR YAN   Episodes since 11/ 2015--  neurologist dx  differential complex partial seizure  .vs. mood disorder . vs. pseudoseizure  . Tremor   . Type 2 diabetes mellitus treated with insulin (HCC)     Family History  Problem Relation Age of Onset  . Diabetes Mother   . Diabetes Father   . Hypertension Father   . Heart attack Father 68       died age 17  . Alcohol abuse Father     Past Surgical History:  Procedure Laterality Date  . CARDIAC CATHETERIZATION  12-27-2001  DR Einar Gip  &  05-26-2009  DR Irish Lack   RESULTS FOR BOTH ARE NORMAL CORONARIES AND PERSERVED LVF/ EF 60%  . CARPAL TUNNEL RELEASE Right 09-16-2003  . CARPAL TUNNEL RELEASE Left 02/25/2015   Procedure: LEFT CARPAL TUNNEL RELEASE;  Surgeon: Leanora Cover, MD;  Location: [facility];  Service: Orthopedics;  Laterality: Left;  . COLONOSCOPY  2014  . CYSTOSCOPY N/A 10/10/2012   Procedure: CYSTOSCOPY CLOT EVACUATION FULGERATION OF BLEEDERS ;  Surgeon: Claybon Jabs, MD;  Location: [facility];  Service: Urology;  Laterality: N/A;  . CYSTOSCOPY WITH BIOPSY N/A 11/26/2015   Procedure:  CYSTOSCOPY WITH BIOPSY AND FULGURATION;  Surgeon: Kathie Rhodes, MD;   Location: [facility];  Service: Urology;  Laterality: N/A;  . ESOPHAGOGASTRODUODENOSCOPY  2014  . LAPAROSCOPIC CHOLECYSTECTOMY  11-17-2010  . NEGATIVE SLEEP STUDY  04-19-2015  in epic  . ORCHIECTOMY Right 02/21/2016   Procedure: SCROTAL ORCHIECTOMY with TESTICULAR PROSTHESIS IMPLANT;  Surgeon: Kathie Rhodes, MD;  Location: [facility];  Service: Urology;  Laterality: Right;  . ORCHIECTOMY Left 09/02/2018   Procedure: ORCHIECTOMY;  Surgeon: Kathie Rhodes, MD;  Location: [facility];  Service: Urology;  Laterality: Left;  . ROTATOR CUFF REPAIR Right 12/2004  . TRANSURETHRAL RESECTION OF BLADDER TUMOR N/A 08/09/2012   Procedure: TRANSURETHRAL RESECTION OF BLADDER TUMOR (TURBT) WITH GYRUS WITH MITOMYCIN C;  Surgeon: Claybon Jabs, MD;  Location: [facility];  Service: Urology;  Laterality: N/A;  . TRANSURETHRAL RESECTION OF BLADDER TUMOR WITH GYRUS (TURBT-GYRUS) N/A 02/27/2014   Procedure: TRANSURETHRAL RESECTION OF BLADDER TUMOR WITH GYRUS (TURBT-GYRUS);  Surgeon: Claybon Jabs, MD;  Location[facility];  Service: Urology;  Laterality: N/A;   Social History   Occupational History  . Occupation: Engineer, technical sales    Comment: Owner of company  Tobacco Use  . Smoking status: Current Every Day Smoker    Packs/day: 2.00    Years: 38.00    Pack years: 76.00    Types: Cigarettes  . Smokeless tobacco: Never Used  . Tobacco comment: Has cut back to 1 pack a day  Substance and Sexual Activity  . Alcohol use: No  . Drug use: No  . Sexual activity: Not Currently    Partners: Female    Birth control/protection: None

## 2019-03-25 ENCOUNTER — Other Ambulatory Visit: Payer: Self-pay

## 2019-03-25 ENCOUNTER — Ambulatory Visit (INDEPENDENT_AMBULATORY_CARE_PROVIDER_SITE_OTHER): Payer: No Typology Code available for payment source | Admitting: Orthopedic Surgery

## 2019-03-25 ENCOUNTER — Ambulatory Visit: Payer: Medicare Other | Admitting: Family

## 2019-03-25 ENCOUNTER — Ambulatory Visit (INDEPENDENT_AMBULATORY_CARE_PROVIDER_SITE_OTHER): Payer: No Typology Code available for payment source | Admitting: Endocrinology

## 2019-03-25 ENCOUNTER — Encounter: Payer: No Typology Code available for payment source | Attending: Family Medicine | Admitting: Nutrition

## 2019-03-25 ENCOUNTER — Encounter: Payer: Self-pay | Admitting: Endocrinology

## 2019-03-25 ENCOUNTER — Encounter: Payer: Self-pay | Admitting: Family

## 2019-03-25 VITALS — Ht 72.0 in | Wt 217.0 lb

## 2019-03-25 VITALS — BP 124/70 | HR 105 | Ht 72.0 in | Wt 217.2 lb

## 2019-03-25 DIAGNOSIS — M5416 Radiculopathy, lumbar region: Secondary | ICD-10-CM | POA: Diagnosis not present

## 2019-03-25 DIAGNOSIS — E1142 Type 2 diabetes mellitus with diabetic polyneuropathy: Secondary | ICD-10-CM

## 2019-03-25 DIAGNOSIS — E119 Type 2 diabetes mellitus without complications: Secondary | ICD-10-CM | POA: Insufficient documentation

## 2019-03-25 DIAGNOSIS — Z794 Long term (current) use of insulin: Secondary | ICD-10-CM | POA: Insufficient documentation

## 2019-03-25 MED ORDER — TOUJEO MAX SOLOSTAR 300 UNIT/ML ~~LOC~~ SOPN: 280.0000 [IU] | PEN_INJECTOR | SUBCUTANEOUS | 11 refills | Status: DC

## 2019-03-25 MED ORDER — TRULICITY 1.5 MG/0.5ML ~~LOC~~ SOAJ: 1.5000 mg | SUBCUTANEOUS | 11 refills | Status: DC

## 2019-03-25 NOTE — Progress Notes (Signed)
Office Visit Note   Patient: Vincent [name]           Date of Birth: 1965-05-22           MRN: [id number] Visit Date: 03/25/2019              Requested by: Hayden Rasmussen, MD [address] [place],  [place] [id number] PCP: Hayden Rasmussen, MD  No chief complaint on file.     HPI: Patient is a 54 year old gentleman who is seen in follow-up for right lower extremity pain.  Patient on his last evaluation was provided an intra-articular injection for the right knee he states that this did not change his knee symptoms.  He states that his pain starts around the hip radiates down to the knee and radiates down to the calf.  Patient has pain with sitting standing walking and also has weakness trying to go up or down stairs.  Assessment & Plan: Visit Diagnoses:  1. Lumbar back pain with radiculopathy affecting right lower extremity     Plan: Discussed with the patient that his symptoms are sciatic in nature.  Discussed that he does need a neurosurgical consultation.  He was given his CD disc of the lumbar spine MRI scan.  Recommended a neurosurgical consult versus a neurology consult.  There are no mechanical symptoms in the right knee.  Follow-Up Instructions: Return if symptoms worsen or fail to improve.   Ortho Exam  Patient is alert, oriented, no adenopathy, well-dressed, normal affect, normal respiratory effort. Examination patient ambulates with a limp and weakness in the right lower extremity and use the cane in the right hand.  Patient has no pain with range of motion of the hip knee or ankle.  The medial and lateral joint lines of the right knee are nontender to palpation there is no effusion of the knee.  Patient has a positive sciatic tension sign in the right lower extremity.  Patient has extreme weakness with hip flexion on the right side has weakness with knee flexion and extension and has weakness with plantarflexion and dorsiflexion of the right ankle and weakness  of the EHL strength on the right.  His greatest deficit is hip flexion on the right. Patient cannot walk on his toes or heels.  The MRI scan on the disc was reviewed it was difficult to see  fine detail to identify disc pathology  [place]: No results found. No images are attached to the encounter.  Labs: Lab Results  Component Value Date   HGBA1C 10.3 (A) 02/21/2019   HGBA1C 11.4 (H) 10/20/2018   HGBA1C 11.3 (H) 10/19/2018   REPTSTATUS 03/06/2014 FINAL 03/04/2014   CULT NO GROWTH Performed at [facility]  03/04/2014     Lab Results  Component Value Date   ALBUMIN 4.2 10/19/2018   ALBUMIN 3.9 05/17/2017   ALBUMIN 3.7 05/28/2015    Lab Results  Component Value Date   MG 2.2 01/03/2018   Lab Results  Component Value Date   VD25OH 22.5 (L) 11/22/2016    No results found for: PREALBUMIN CBC EXTENDED Latest Ref Rng & Units 12/10/2018 10/19/2018 10/19/2018  WBC 4.0 - 10.5 K/uL 7.1 8.8 -  RBC 4.22 - 5.81 MIL/uL 5.01 5.78 -  HGB 13.0 - 17.0 g/dL 14.8 17.1(H) 17.0  HCT 39.0 - 52.0 % 42.4 48.6 50.0  PLT 150 - 400 K/uL 210 243 -  NEUTROABS 1.7 - 7.7 K/uL 4.0 5.9 -  LYMPHSABS 0.7 -  4.0 K/uL 2.2 2.3 -     Body mass index is 29.43 kg/m.  Orders:  No orders of the defined types were placed in this encounter.  No orders of the defined types were placed in this encounter.    Procedures: No procedures performed  Clinical Data: No additional findings.  ROS:  All other systems negative, except as noted in the HPI. Review of Systems  Objective: Vital Signs: Ht 6' (1.829 m)   Wt 217 lb (98.4 kg)   BMI 29.43 kg/m   Specialty Comments:  No specialty comments available.  PMFS History: Patient Active Problem List   Diagnosis Date Noted  . TIA (transient ischemic attack) 10/20/2018  . Uncontrolled type 2 DM with hyperosmolar nonketotic hyperglycemia ([place]) 10/19/2018  . Left sided numbness 10/19/2018  . Diabetic autonomic neuropathy associated with type 2  diabetes mellitus ([place]) 02/01/2018  . DM type 2 with diabetic peripheral neuropathy ([place]) 02/01/2018  . Dyslipidemia 01/31/2018  . Coronary artery disease involving native coronary artery of native heart without angina pectoris 01/31/2018  . Chest pain 01/01/2018  . Seizure disorder ([place]) 12/19/2016  . Essential tremor 12/19/2016  . Polypharmacy 12/19/2016  . Chronic post-traumatic stress disorder (PTSD) 06/16/2016  . Tremor 06/16/2016  . Tobacco use disorder 05/05/2016  . Psychophysiological insomnia 06/02/2015  . Altered mental status 05/28/2015  . Acute bronchitis 05/28/2015  . Type 2 diabetes mellitus with hyperglycemia ([place]) 05/28/2015  . Hypothyroidism 05/28/2015  . Bipolar 2 disorder ([place]) 05/28/2015  . History of rheumatoid arthritis 05/28/2015  . Chronic pain 05/28/2015  . Depression 03/18/2015  . Diabetes ([place]) 01/23/2015  . Obesity (BMI 30-39.9) 03/06/2014  . Acute encephalopathy 03/05/2014  . HLD (hyperlipidemia) 03/05/2014  . Anemia, normocytic normochromic 03/05/2014  . Altered mental state   . Protein-calorie malnutrition, severe ([place]) 10/09/2012  . Bladder tumor 08/08/2012  . Biliary dyskinesia 11/02/2010   Past Medical History:  Diagnosis Date  . Anxiety   . Bilateral carpal tunnel syndrome   . Bipolar 1 disorder ([place])   . CAD (coronary artery disease)    Non obstructive on CTA Oct 2019.   Marland Kitchen Chronic pain syndrome    back  . Cold extremities    BLE  . COPD (chronic obstructive pulmonary disease) ([place])   . Cubital tunnel syndrome on right   . DDD (degenerative disc disease), lumbar   . Diabetic peripheral neuropathy ([place])   . Gastroparesis   . GERD (gastroesophageal reflux disease)   . Hiatal hernia   . History of bladder cancer urologist-  dr Consuella Lose   papillay TCC (Ta G1)  s/p TURBT and chemo instillation 2014  . History of bronchitis   . History of carpal tunnel syndrome    Bilateral  . History of chest pain 12/2017  . History of chest pain  12/2017   heart cath normal  . History of encephalopathy 05/27/2015   admission w/ acute encephalopathy thought to be secondary to pain meds and COPD  . History of gastric ulcer   . History of Helicobacter pylori infection   . History of kidney stones    not aware  . History of TIA (transient ischemic attack) 2008    no residual  . History of traumatic head injury 2010   w/ LOC  per pt needed stitches, hit in head with a mower blade  . Hyperlipidemia   . Hypertension   . Hypothyroidism   . Insomnia    per sleep study 04-19-2015 without sleep apnea  .  Neuropathy in diabetes ([place])    LOWER EXTREMITIES  . PTSD (post-traumatic stress disorder)   . RA (rheumatoid arthritis) ([place])   . Seizures, transient [facility]) neurologist-  dr Krista Blue--  differential dx complex partial seizure .vs.  mood disorder .vs.  pseudoseizure--  negative EEG's   confusion episodes and staring spells since 11/ 2015  . Sleep apnea    Mild, NO CPAP ORDERED  . Transient confusion NEUOROLOGIST-  DR YAN   Episodes since 11/ 2015--  neurologist dx  differential complex partial seizure  .vs. mood disorder . vs. pseudoseizure  . Tremor   . Type 2 diabetes mellitus treated with insulin (HCC)     Family History  Problem Relation Age of Onset  . Diabetes Mother   . Diabetes Father   . Hypertension Father   . Heart attack Father 84       died age 67  . Alcohol abuse Father     Past Surgical History:  Procedure Laterality Date  . CARDIAC CATHETERIZATION  12-27-2001  DR Einar Gip  &  05-26-2009  DR Irish Lack   RESULTS FOR BOTH ARE NORMAL CORONARIES AND PERSERVED LVF/ EF 60%  . CARPAL TUNNEL RELEASE Right 09-16-2003  . CARPAL TUNNEL RELEASE Left 02/25/2015   Procedure: LEFT CARPAL TUNNEL RELEASE;  Surgeon: Leanora Cover, MD;  Location: [facility];  Service: Orthopedics;  Laterality: Left;  . COLONOSCOPY  2014  . CYSTOSCOPY N/A 10/10/2012   Procedure: CYSTOSCOPY CLOT EVACUATION FULGERATION OF BLEEDERS ;  Surgeon:  Claybon Jabs, MD;  Location: [facility];  Service: Urology;  Laterality: N/A;  . CYSTOSCOPY WITH BIOPSY N/A 11/26/2015   Procedure: CYSTOSCOPY WITH BIOPSY AND FULGURATION;  Surgeon: Kathie Rhodes, MD;  Location: [facility];  Service: Urology;  Laterality: N/A;  . ESOPHAGOGASTRODUODENOSCOPY  2014  . LAPAROSCOPIC CHOLECYSTECTOMY  11-17-2010  . NEGATIVE SLEEP STUDY  04-19-2015  in epic  . ORCHIECTOMY Right 02/21/2016   Procedure: SCROTAL ORCHIECTOMY with TESTICULAR PROSTHESIS IMPLANT;  Surgeon: Kathie Rhodes, MD;  Location: [facility];  Service: Urology;  Laterality: Right;  . ORCHIECTOMY Left 09/02/2018   Procedure: ORCHIECTOMY;  Surgeon: Kathie Rhodes, MD;  Location[facility];  Service: Urology;  Laterality: Left;  . ROTATOR CUFF REPAIR Right 12/2004  . TRANSURETHRAL RESECTION OF BLADDER TUMOR N/A 08/09/2012   Procedure: TRANSURETHRAL RESECTION OF BLADDER TUMOR (TURBT) WITH GYRUS WITH MITOMYCIN C;  Surgeon: Claybon Jabs, MD;  Location: [facility];  Service: Urology;  Laterality: N/A;  . TRANSURETHRAL RESECTION OF BLADDER TUMOR WITH GYRUS (TURBT-GYRUS) N/A 02/27/2014   Procedure: TRANSURETHRAL RESECTION OF BLADDER TUMOR WITH GYRUS (TURBT-GYRUS);  Surgeon: Claybon Jabs, MD;  Location: [facility];  Service: Urology;  Laterality: N/A;   Social History   Occupational History  . Occupation: Engineer, technical sales    Comment: Owner of company  Tobacco Use  . Smoking status: Current Every Day Smoker    Packs/day: 2.00    Years: 38.00    Pack years: 76.00    Types: Cigarettes  . Smokeless tobacco: Never Used  . Tobacco comment: Has cut back to 1 pack a day  Substance and Sexual Activity  . Alcohol use: No  . Drug use: No  . Sexual activity: Not Currently    Partners: Female    Birth control/protection: None

## 2019-03-25 NOTE — Progress Notes (Signed)
Subjective:    Patient ID: Vincent [name], male    DOB: March 25, 1965, 54 y.o.   MRN: [id number]  HPI Pt returns for f/u of diabetes mellitus: DM type: Insulin-requiring type 2 Dx'ed: [id number] Complications: PN, TIA, CAD, and AN Therapy: insulin since 2005 DKA: never Severe hypoglycemia: never Pancreatitis: never Pancreatic imaging: normal on 2013 CT SDOH: none Other: he has intermittent steroid injections into the lower back; he stopped Trulicity, due to lack of effect.   Interval history: no cbg record, but states cbg's vary from 43-410.  It is in general lowest fasting.  pt states he feels well in general.  He is fasting today.  Past Medical History:  Diagnosis Date  . Anxiety   . Bilateral carpal tunnel syndrome   . Bipolar 1 disorder ([place])   . CAD (coronary artery disease)    Non obstructive on CTA Oct 2019.   Marland Kitchen Chronic pain syndrome    back  . Cold extremities    BLE  . COPD (chronic obstructive pulmonary disease) ([place])   . Cubital tunnel syndrome on right   . DDD (degenerative disc disease), lumbar   . Diabetic peripheral neuropathy ([place])   . Gastroparesis   . GERD (gastroesophageal reflux disease)   . Hiatal hernia   . History of bladder cancer urologist-  dr Consuella Lose   papillay TCC (Ta G1)  s/p TURBT and chemo instillation 2014  . History of bronchitis   . History of carpal tunnel syndrome    Bilateral  . History of chest pain 12/2017  . History of chest pain 12/2017   heart cath normal  . History of encephalopathy 05/27/2015   admission w/ acute encephalopathy thought to be secondary to pain meds and COPD  . History of gastric ulcer   . History of Helicobacter pylori infection   . History of kidney stones    not aware  . History of TIA (transient ischemic attack) 2008    no residual  . History of traumatic head injury 2010   w/ LOC  per pt needed stitches, hit in head with a mower blade  . Hyperlipidemia   . Hypertension   . Hypothyroidism   . Insomnia    per sleep study 04-19-2015 without sleep apnea  . Neuropathy in diabetes ([place])    LOWER EXTREMITIES  . PTSD (post-traumatic stress disorder)   . RA (rheumatoid arthritis) ([place])   . Seizures, transient [facility]) neurologist-  dr Krista Blue--  differential dx complex partial seizure .vs.  mood disorder .vs.  pseudoseizure--  negative EEG's   confusion episodes and staring spells since 11/ 2015  . Sleep apnea    Mild, NO CPAP ORDERED  . Transient confusion NEUOROLOGIST-  DR YAN   Episodes since 11/ 2015--  neurologist dx  differential complex partial seizure  .vs. mood disorder . vs. pseudoseizure  . Tremor   . Type 2 diabetes mellitus treated with insulin [facility])     Past Surgical History:  Procedure Laterality Date  . CARDIAC CATHETERIZATION  12-27-2001  DR Einar Gip  &  05-26-2009  DR Irish Lack   RESULTS FOR BOTH ARE NORMAL CORONARIES AND PERSERVED LVF/ EF 60%  . CARPAL TUNNEL RELEASE Right 09-16-2003  . CARPAL TUNNEL RELEASE Left 02/25/2015   Procedure: LEFT CARPAL TUNNEL RELEASE;  Surgeon: Leanora Cover, MD;  Location: [place];  Service: Orthopedics;  Laterality: Left;  . COLONOSCOPY  2014  . CYSTOSCOPY N/A 10/10/2012   Procedure: CYSTOSCOPY CLOT EVACUATION FULGERATION OF BLEEDERS ;  Surgeon: Claybon Jabs, MD;  Location: [facility];  Service: Urology;  Laterality: N/A;  . CYSTOSCOPY WITH BIOPSY N/A 11/26/2015   Procedure: CYSTOSCOPY WITH BIOPSY AND FULGURATION;  Surgeon: Kathie Rhodes, MD;  Location: [place];  Service: Urology;  Laterality: N/A;  . ESOPHAGOGASTRODUODENOSCOPY  2014  . LAPAROSCOPIC CHOLECYSTECTOMY  11-17-2010  . NEGATIVE SLEEP STUDY  04-19-2015  in epic  . ORCHIECTOMY Right 02/21/2016   Procedure: SCROTAL ORCHIECTOMY with TESTICULAR PROSTHESIS IMPLANT;  Surgeon: Kathie Rhodes, MD;  Location: [facility];  Service: Urology;  Laterality: Right;  . ORCHIECTOMY Left 09/02/2018   Procedure: ORCHIECTOMY;  Surgeon: Kathie Rhodes,  MD;  Location: [facility];  Service: Urology;  Laterality: Left;  . ROTATOR CUFF REPAIR Right 12/2004  . TRANSURETHRAL RESECTION OF BLADDER TUMOR N/A 08/09/2012   Procedure: TRANSURETHRAL RESECTION OF BLADDER TUMOR (TURBT) WITH GYRUS WITH MITOMYCIN C;  Surgeon: Claybon Jabs, MD;  Location[facility];  Service: Urology;  Laterality: N/A;  . TRANSURETHRAL RESECTION OF BLADDER TUMOR WITH GYRUS (TURBT-GYRUS) N/A 02/27/2014   Procedure: TRANSURETHRAL RESECTION OF BLADDER TUMOR WITH GYRUS (TURBT-GYRUS);  Surgeon: Claybon Jabs, MD;  Location: [facility];  Service: Urology;  Laterality: N/A;    Social History   Socioeconomic History  . Marital status: Married    Spouse name: Not on file  . Number of children: 3  . Years of education: GED  . Highest education level: Not on file  Occupational History  . Occupation: Engineer, technical sales    Comment: Owner of company  Tobacco Use  . Smoking status: Current Every Day Smoker    Packs/day: 2.00    Years: 38.00    Pack years: 76.00    Types: Cigarettes  . Smokeless tobacco: Never Used  . Tobacco comment: Has cut back to 1 pack a day  Substance and Sexual Activity  . Alcohol use: No  . Drug use: No  . Sexual activity: Not Currently    Partners: Female    Birth control/protection: None  Other Topics Concern  . Not on file  Social History Narrative   Lives at home with his wife and children.   Left-handed.   3-4 cups caffeine per day.   Social Determinants of Health   Financial Resource Strain:   . Difficulty of Paying Living Expenses: Not on file  Food Insecurity:   . Worried About Charity fundraiser in the Last Year: Not on file  . Ran Out of Food in the Last Year: Not on file  Transportation Needs:   . Lack of Transportation (Medical): Not on file  . Lack of Transportation (Non-Medical): Not on file  Physical Activity:   . Days of Exercise per Week: Not on file  . Minutes  of Exercise per Session: Not on file  Stress:   . Feeling of Stress : Not on file  Social Connections:   . Frequency of Communication with Friends and Family: Not on file  . Frequency of Social Gatherings with Friends and Family: Not on file  . Attends Religious Services: Not on file  . Active Member of Clubs or Organizations: Not on file  . Attends Archivist Meetings: Not on file  . Marital Status: Not on file  Intimate Partner Violence:   . Fear of Current or Ex-Partner: Not on file  . Emotionally Abused: Not on file  . Physically Abused: Not on file  . Sexually  Abused: Not on file    Current Outpatient Medications on File Prior to Visit  Medication Sig Dispense Refill  . albuterol (VENTOLIN HFA) 108 (90 Base) MCG/ACT inhaler Inhale 1-2 puffs into the lungs every 6 (six) hours as needed for wheezing or shortness of breath. 18 g 0  . ARIPiprazole (ABILIFY) 5 MG tablet Take 1 tablet (5 mg total) by mouth daily. 30 tablet 2  . atorvastatin (LIPITOR) 40 MG tablet Take 1 tablet (40 mg total) by mouth daily at 6 PM. 30 tablet 1  . busPIRone (BUSPAR) 5 MG tablet Take 1 tablet (5 mg total) by mouth 2 (two) times daily. 60 tablet 2  . Cholecalciferol (VITAMIN D3) 2000 units TABS TAKE 1 TABLET BY MOUTH ONCE DAILY 90 tablet 0  . clopidogrel (PLAVIX) 75 MG tablet Take 1 tablet (75 mg total) by mouth daily. 30 tablet 0  . cyclobenzaprine (FLEXERIL) 10 MG tablet Take 1 tablet by mouth 3 times daily as needed for muscle spasm. Warning: May cause drowsiness. 21 tablet 0  . fentaNYL (DURAGESIC - DOSED MCG/HR) 75 MCG/HR Place 75 mcg onto the skin every 3 (three) days.     Marland Kitchen gabapentin (NEURONTIN) 600 MG tablet Take 600 mg by mouth 2 (two) times daily.    . hydroxychloroquine (PLAQUENIL) 200 MG tablet Take 200 mg by mouth 2 (two) times daily.     . isosorbide mononitrate (IMDUR) 120 MG 24 hr tablet Take 1 tablet by mouth once daily 90 tablet 0  . lamoTRIgine (LAMICTAL) 150 MG tablet Take 2  tablets (300 mg total) by mouth every morning. (Patient taking differently: Take 150 mg by mouth daily. ) 60 tablet 2  . levothyroxine (SYNTHROID, LEVOTHROID) 25 MCG tablet Take 25 mcg by mouth daily before breakfast.     . metFORMIN (GLUCOPHAGE-XR) 500 MG 24 hr tablet Take 500 mg by mouth 2 (two) times daily.    . metoCLOPramide (REGLAN) 10 MG tablet Take 10 mg by mouth 4 (four) times daily.    . mirtazapine ([name]) 30 MG tablet Take 1 tablet (30 mg total) by mouth at bedtime. 30 tablet 2  . mupirocin ointment (BACTROBAN) 2 % Apply 1 application topically 2 (two) times daily. Apply to the affected area 2 times a day 22 g 3  . omeprazole (PRILOSEC) 20 MG capsule Take 20 mg by mouth every morning.     . ondansetron (ZOFRAN-ODT) 4 MG disintegrating tablet Take 1 tablet (4 mg total) by mouth every 8 (eight) hours as needed for nausea or vomiting. 15 tablet 0  . pregabalin (LYRICA) 150 MG capsule Take 150 mg by mouth 2 (two) times daily.    . Prenatal Vit-Fe Fumarate-FA (PREPLUS) 27-1 MG TABS Take 1 tablet by mouth daily.  3  . rosuvastatin (CRESTOR) 10 MG tablet Take 10 mg by mouth daily.    . tamsulosin (FLOMAX) 0.4 MG CAPS capsule Take 0.4 mg by mouth daily.    Marland Kitchen testosterone cypionate (DEPOTESTOSTERONE CYPIONATE) 200 MG/ML injection Inject 1 mL into the muscle every 14 (fourteen) days.    Marland Kitchen venlafaxine XR (EFFEXOR-XR) 150 MG 24 hr capsule Take 2 capsules (300 mg total) by mouth daily with breakfast. (Patient taking differently: Take 150 mg by mouth daily with breakfast. ) 60 capsule 2  . fenofibrate 160 MG tablet Take 1 tablet (160 mg total) by mouth daily. (Patient not taking: Reported on 02/21/2019) 30 tablet 0  . metoprolol tartrate (LOPRESSOR) 25 MG tablet Take 1 tablet (25 mg total) by  mouth 2 (two) times daily. (Patient not taking: Reported on 02/21/2019) 60 tablet 0   No current facility-administered medications on file prior to visit.    Allergies  Allergen Reactions  . Celebrex  [Celecoxib] Anaphylaxis and Rash  . Hydrocodone Rash and Other (See Comments)    "blisters developed on arms"  . Sulfa Antibiotics Rash    Family History  Problem Relation Age of Onset  . Diabetes Mother   . Diabetes Father   . Hypertension Father   . Heart attack Father 31       died age 45  . Alcohol abuse Father     BP 124/70 (BP Location: Left Arm, Patient Position: Sitting, Cuff Size: Normal)   Pulse (!) 105   Ht 6' (1.829 m)   Wt 217 lb 3.2 oz (98.5 kg)   SpO2 96%   BMI 29.46 kg/m   Review of Systems Denies LOC    Objective:   Physical Exam VITAL SIGNS:  See vs page GENERAL: no distress Pulses: dorsalis pedis intact bilat.   MSK: no deformity of the feet CV: no leg edema Skin:  no ulcer on the feet, but the skin is dry.  normal color and temp on the feet. Neuro: sensation is intact to touch on the feet      Assessment & Plan:  Insulin-requiring type 2 DM, with CAD: he would benefit from increased rx, if it can be done with a regimen that avoids or minimizes hypoglycemia. Hypoglycemia: this limits aggressiveness of glycemic control.    Patient Instructions  check your blood sugar twice a day.  vary the time of day when you check, between before the 3 meals, and at bedtime.  also check if you have symptoms of your blood sugar being too high or too low.  please keep a record of the readings and bring it to your next appointment here (or you can bring the meter itself).  You can write it on any piece of paper.  please call us sooner if your blood sugar goes below 70, or if you have a lot of readings over 200. For now, please: double the Trulicity, and: Reduce the Toujeo to 280 units each morning. Please come back for a follow-up appointment in 1 month.   Blood tests are requested for you today, to see if you qualify for the pump.  We'll let you know about the results.

## 2019-03-25 NOTE — Patient Instructions (Addendum)
check your blood sugar twice a day.  vary the time of day when you check, between before the 3 meals, and at bedtime.  also check if you have symptoms of your blood sugar being too high or too low.  please keep a record of the readings and bring it to your next appointment here (or you can bring the meter itself).  You can write it on any piece of paper.  please call us sooner if your blood sugar goes below 70, or if you have a lot of readings over 200. For now, please: double the Trulicity, and: Reduce the Toujeo to 280 units each morning. Please come back for a follow-up appointment in 1 month.   Blood tests are requested for you today, to see if you qualify for the pump.  We'll let you know about the results.

## 2019-03-26 NOTE — Patient Instructions (Signed)
Review all brochures given and call if questions. If you decide on one pump, please fill out information sheet and return it to the office

## 2019-03-26 NOTE — Progress Notes (Signed)
Patient is interested in insulin pumps and wants more information.  Says his blood sugar is up and down.  Discussed how insulin pumps work and how it compares to a functioning pancreas.Marland Kitchen He was shown the different pumps and how they work with their respective CGMs.  He was given brochures on each model and told to go to web sites if he wants more information.  He was also shown the V-Go and told that if he can not afford an insulin pump, that this might be another good option.  He had no final questions.

## 2019-03-27 DIAGNOSIS — M62551 Muscle wasting and atrophy, not elsewhere classified, right thigh: Secondary | ICD-10-CM | POA: Diagnosis not present

## 2019-03-27 DIAGNOSIS — R208 Other disturbances of skin sensation: Secondary | ICD-10-CM | POA: Diagnosis not present

## 2019-03-27 DIAGNOSIS — Z6828 Body mass index (BMI) 28.0-28.9, adult: Secondary | ICD-10-CM | POA: Diagnosis not present

## 2019-04-04 ENCOUNTER — Ambulatory Visit: Payer: Medicare Other | Admitting: Orthopaedic Surgery

## 2019-04-08 DIAGNOSIS — G629 Polyneuropathy, unspecified: Secondary | ICD-10-CM | POA: Diagnosis not present

## 2019-04-08 DIAGNOSIS — E1144 Type 2 diabetes mellitus with diabetic amyotrophy: Secondary | ICD-10-CM | POA: Diagnosis not present

## 2019-04-12 DIAGNOSIS — E1151 Type 2 diabetes mellitus with diabetic peripheral angiopathy without gangrene: Secondary | ICD-10-CM | POA: Diagnosis not present

## 2019-04-16 ENCOUNTER — Other Ambulatory Visit: Payer: Self-pay

## 2019-04-16 ENCOUNTER — Encounter (HOSPITAL_BASED_OUTPATIENT_CLINIC_OR_DEPARTMENT_OTHER): Payer: No Typology Code available for payment source | Admitting: Physician Assistant

## 2019-04-16 DIAGNOSIS — L97812 Non-pressure chronic ulcer of other part of right lower leg with fat layer exposed: Secondary | ICD-10-CM | POA: Insufficient documentation

## 2019-04-16 DIAGNOSIS — Z833 Family history of diabetes mellitus: Secondary | ICD-10-CM | POA: Insufficient documentation

## 2019-04-16 DIAGNOSIS — I2510 Atherosclerotic heart disease of native coronary artery without angina pectoris: Secondary | ICD-10-CM | POA: Insufficient documentation

## 2019-04-16 DIAGNOSIS — Z8551 Personal history of malignant neoplasm of bladder: Secondary | ICD-10-CM | POA: Diagnosis not present

## 2019-04-16 DIAGNOSIS — Z8249 Family history of ischemic heart disease and other diseases of the circulatory system: Secondary | ICD-10-CM | POA: Diagnosis not present

## 2019-04-16 DIAGNOSIS — Z841 Family history of disorders of kidney and ureter: Secondary | ICD-10-CM | POA: Insufficient documentation

## 2019-04-16 DIAGNOSIS — G40909 Epilepsy, unspecified, not intractable, without status epilepticus: Secondary | ICD-10-CM | POA: Insufficient documentation

## 2019-04-16 DIAGNOSIS — M069 Rheumatoid arthritis, unspecified: Secondary | ICD-10-CM | POA: Insufficient documentation

## 2019-04-16 DIAGNOSIS — Z882 Allergy status to sulfonamides status: Secondary | ICD-10-CM | POA: Insufficient documentation

## 2019-04-16 DIAGNOSIS — J449 Chronic obstructive pulmonary disease, unspecified: Secondary | ICD-10-CM | POA: Diagnosis not present

## 2019-04-16 DIAGNOSIS — E785 Hyperlipidemia, unspecified: Secondary | ICD-10-CM | POA: Insufficient documentation

## 2019-04-16 DIAGNOSIS — Z8673 Personal history of transient ischemic attack (TIA), and cerebral infarction without residual deficits: Secondary | ICD-10-CM | POA: Insufficient documentation

## 2019-04-16 DIAGNOSIS — Z8349 Family history of other endocrine, nutritional and metabolic diseases: Secondary | ICD-10-CM | POA: Diagnosis not present

## 2019-04-16 DIAGNOSIS — I10 Essential (primary) hypertension: Secondary | ICD-10-CM | POA: Diagnosis not present

## 2019-04-16 DIAGNOSIS — F17218 Nicotine dependence, cigarettes, with other nicotine-induced disorders: Secondary | ICD-10-CM | POA: Diagnosis not present

## 2019-04-16 DIAGNOSIS — K219 Gastro-esophageal reflux disease without esophagitis: Secondary | ICD-10-CM | POA: Insufficient documentation

## 2019-04-16 DIAGNOSIS — E1140 Type 2 diabetes mellitus with diabetic neuropathy, unspecified: Secondary | ICD-10-CM | POA: Diagnosis not present

## 2019-04-16 DIAGNOSIS — Z6829 Body mass index (BMI) 29.0-29.9, adult: Secondary | ICD-10-CM | POA: Diagnosis not present

## 2019-04-16 DIAGNOSIS — Z885 Allergy status to narcotic agent status: Secondary | ICD-10-CM | POA: Diagnosis not present

## 2019-04-16 DIAGNOSIS — E039 Hypothyroidism, unspecified: Secondary | ICD-10-CM | POA: Diagnosis not present

## 2019-04-16 DIAGNOSIS — Z886 Allergy status to analgesic agent status: Secondary | ICD-10-CM | POA: Diagnosis not present

## 2019-04-16 DIAGNOSIS — L97212 Non-pressure chronic ulcer of right calf with fat layer exposed: Secondary | ICD-10-CM | POA: Diagnosis not present

## 2019-04-16 DIAGNOSIS — E11622 Type 2 diabetes mellitus with other skin ulcer: Secondary | ICD-10-CM | POA: Insufficient documentation

## 2019-04-16 DIAGNOSIS — E1144 Type 2 diabetes mellitus with diabetic amyotrophy: Secondary | ICD-10-CM | POA: Diagnosis not present

## 2019-04-16 DIAGNOSIS — Z823 Family history of stroke: Secondary | ICD-10-CM | POA: Insufficient documentation

## 2019-04-17 ENCOUNTER — Encounter (HOSPITAL_BASED_OUTPATIENT_CLINIC_OR_DEPARTMENT_OTHER): Payer: No Typology Code available for payment source | Admitting: Internal Medicine

## 2019-04-17 DIAGNOSIS — M47816 Spondylosis without myelopathy or radiculopathy, lumbar region: Secondary | ICD-10-CM | POA: Diagnosis not present

## 2019-04-17 DIAGNOSIS — G894 Chronic pain syndrome: Secondary | ICD-10-CM | POA: Diagnosis not present

## 2019-04-17 DIAGNOSIS — M5136 Other intervertebral disc degeneration, lumbar region: Secondary | ICD-10-CM | POA: Diagnosis not present

## 2019-04-17 DIAGNOSIS — M5416 Radiculopathy, lumbar region: Secondary | ICD-10-CM | POA: Diagnosis not present

## 2019-04-18 DIAGNOSIS — R569 Unspecified convulsions: Secondary | ICD-10-CM | POA: Diagnosis not present

## 2019-04-22 NOTE — Progress Notes (Signed)
Vincent, [name] ([id number]) Visit Report for 04/16/2019 Abuse/Suicide Risk Screen Details Patient Name: Date of Service: Vincent [name], Vincent [name] 04/16/2019 1:15 PM Medical Record [id number] Patient Account Number: [id number] Date of Birth/Sex: Treating RN: 25-Mar-1965 (54 y.o. Male) Levan Hurst Primary Care Mikie Misner: Hayden Rasmussen Other Clinician: Referring Brookelyn Gaynor: Treating Lenise Jr/Extender:Stone III, Ileene Musa, Penelope Galas in Treatment: 0 Abuse/Suicide Risk Screen Items Answer ABUSE RISK SCREEN: Has anyone close to you tried to hurt or harm you recentlyo No Do you feel uncomfortable with anyone in your familyo No Has anyone forced you do things that you didnt want to doo No Electronic Signature(s) Signed: 04/22/2019 5:30:42 PM By: Levan Hurst RN, BSN Entered By: Levan Hurst on 04/16/2019 14:15:44 -------------------------------------------------------------------------------- Activities of Daily Living Details Patient Name: Date of Service: Vincent, [name] 04/16/2019 1:15 PM Medical Record [place]:[id number] Patient Account Number: [id number] Date of Birth/Sex: Treating RN: 15-May-1965 (54 y.o. Male) Levan Hurst Primary Care Kelaiah Escalona: Hayden Rasmussen Other Clinician: Referring Miranda Frese: Treating Khole Arterburn/Extender:Stone III, Ileene Musa, Penelope Galas in Treatment: 0 Activities of Daily Living Items Answer Activities of Daily Living (Please select one for each item) Drive Automobile Not Able Take Medications Completely Able Use Telephone Completely Able Care for Appearance Completely Able Use Toilet Completely Able Bath / Shower Need Assistance Dress Self Completely Able Feed Self Completely Able Walk Need Assistance Get In / Out Bed Need Assistance Housework Need Assistance Prepare Meals Need Assistance Handle Money Completely Able Shop for Self Need Assistance Electronic Signature(s) Signed: 04/22/2019 5:30:42 PM By: Levan Hurst RN, BSN Entered  By: Levan Hurst on 04/16/2019 14:17:33 -------------------------------------------------------------------------------- Education Screening Details Patient Name: Date of Service: Vincent, [name] 04/16/2019 1:15 PM Medical Record [place]:[id number] Patient Account Number: [id number] Date of Birth/Sex: Treating RN: [birth year] (54 y.o. Male) Levan Hurst Primary Care Sheritta Deeg: Hayden Rasmussen Other Clinician: Referring Zaryiah Barz: Treating Aveline Daus/Extender:Stone III, Ileene Musa, Penelope Galas in Treatment: 0 Primary Learner Assessed: Patient Learning Preferences/Education Level/Primary Language Learning Preference: Explanation, Demonstration, Printed Material Highest Education Level: High School Preferred Language: English Cognitive Barrier Language Barrier: No Translator Needed: No Memory Deficit: No Emotional Barrier: No Cultural/Religious Beliefs Affecting Medical Care: No Physical Barrier Impaired Vision: No Impaired Hearing: No Decreased Hand dexterity: No Knowledge/Comprehension Knowledge Level: High Comprehension Level: High Ability to understand written High instructions: Ability to understand verbal High instructions: Motivation Anxiety Level: Calm Cooperation: Cooperative Education Importance: Acknowledges Need Interest in Health Problems: Asks Questions Perception: Coherent Willingness to Engage in Self- High Management Activities: Readiness to Engage in Self- High Management Activities: Electronic Signature(s) Signed: 04/22/2019 5:30:42 PM By: Levan Hurst RN, BSN Entered By: Levan Hurst on 04/16/2019 14:17:53 -------------------------------------------------------------------------------- Fall Risk Assessment Details Patient Name: Date of Service: Vincent, [name] 04/16/2019 1:15 PM Medical Record [place]:[id number] Patient Account Number: [id number] Date of Birth/Sex: Treating RN: [birth year] (54 y.o. Male) Levan Hurst Primary Care Keyana Guevara:  Hayden Rasmussen Other Clinician: Referring Sara Keys: Treating Anadelia Kintz/Extender:Stone III, Ileene Musa, Penelope Galas in Treatment: 0 Fall Risk Assessment Items Have you had 2 or more falls in the last 12 monthso 0 Yes Have you had any fall that resulted in injury in the last 12 monthso 0 No FALLS RISK SCREEN History of falling - immediate or within 3 months 25 Yes Secondary diagnosis (Do you have 2 or more medical diagnoseso) 0 No Ambulatory aid None/bed rest/wheelchair/nurse 0 No Crutches/cane/walker 15 Yes Furniture 0 No Intravenous therapy Access/Saline/Heparin Lock 0 No Weak (short steps with or without shuffle, stooped but able to lift  head 0 No while walking, may seek support from furniture) Impaired (short steps with shuffle, may have difficulty arising from chair, 0 No head down, impaired balance) Mental Status Oriented to own ability 0 Yes Overestimates or forgets limitations 0 No Risk Level: Medium Risk Score: 40 Electronic Signature(s) Signed: 04/22/2019 5:30:42 PM By: Levan Hurst RN, BSN Entered By: Levan Hurst on 04/16/2019 14:19:19 -------------------------------------------------------------------------------- Foot Assessment Details Patient Name: Date of Service: Vincent, [name] 04/16/2019 1:15 PM Medical Record [place]:[id number] Patient Account Number: [id number] Date of Birth/Sex: Treating RN: [birth year] (54 y.o. Male) Levan Hurst Primary Care Sanaai Doane: Hayden Rasmussen Other Clinician: Referring Silvio Sausedo: Treating Xavia Kniskern/Extender:Stone III, Ileene Musa, Penelope Galas in Treatment: 0 Foot Assessment Items Site Locations + = Sensation present, - = Sensation absent, C = Callus, U = Ulcer R = Redness, W = Warmth, M = Maceration, PU = Pre-ulcerative lesion F = Fissure, S = Swelling, D = Dryness Assessment Right: Left: Other Deformity: No No Prior Foot Ulcer: No No Prior Amputation: No No Charcot Joint: No No Ambulatory Status: Ambulatory With  Help Assistance Device: Cane Gait: Steady Electronic Signature(s) Signed: 04/22/2019 5:30:42 PM By: Levan Hurst RN, BSN Entered By: Levan Hurst on 04/16/2019 14:21:36 -------------------------------------------------------------------------------- Nutrition Risk Screening Details Patient Name: Date of Service: TYRIK, CROOM 04/16/2019 1:15 PM Medical Record [id number] Patient Account Number: [id number] Date of Birth/Sex: Treating RN: [birth year] (53 y.o. Male) Levan Hurst Primary Care Dimitriy Carreras: Hayden Rasmussen Other Clinician: Referring Donnamarie Shankles: Treating Lynda Capistran/Extender:Stone III, Ileene Musa, Maebelle Munroe Weeks in Treatment: 0 Height (in): 72 Weight (lbs): 217 Body Mass Index (BMI): 29.4 Nutrition Risk Screening Items Score Screening NUTRITION RISK SCREEN: I have an illness or condition that made me change the kind and/or 2 Yes amount of food I eat I eat fewer than two meals per day 0 No I eat few fruits and vegetables, or milk products 0 No I have three or more drinks of beer, liquor or wine almost every day 0 No I have tooth or mouth problems that make it hard for me to eat 0 No I don't always have enough money to buy the food I need 0 No I eat alone most of the time 0 No I take three or more different prescribed or over-the-counter drugs a day 1 Yes 0 No Without wanting to, I have lost or gained 10 pounds in the last six months I am not always physically able to shop, cook and/or feed myself 2 Yes Nutrition Protocols Good Risk Protocol Provide education on elevated blood sugars and Moderate Risk Protocol 0 impact on wound healing, as applicable High Risk Proctocol Risk Level: Moderate Risk Score: 5 Electronic Signature(s) Signed: 04/22/2019 5:30:42 PM By: Levan Hurst RN, BSN Entered By: Levan Hurst on 04/16/2019 14:19:31

## 2019-04-22 NOTE — Progress Notes (Signed)
ADRIENE, AKES ([id number]) Visit Report for 04/16/2019 Chief Complaint Document Details Patient Name: Date of Service: Vincent [name], Vincent [name] 04/16/2019 1:15 PM Medical Record [id number] Patient Account Number: [id number] Date of Birth/Sex: Treating RN: Sep 01, 1965 (53 y.o. Male) Baruch Gouty Primary Care Provider: Hayden Rasmussen Other Clinician: Referring Provider: Treating Provider/Extender:Stone III, Ileene Musa, Penelope Galas in Treatment: 0 Information Obtained from: Patient Chief Complaint Right LE Ulcer Electronic Signature(s) Signed: 04/16/2019 2:59:08 PM By: Worthy Keeler PA-C Entered By: Worthy Keeler on 04/16/2019 14:59:07 -------------------------------------------------------------------------------- HPI Details Patient Name: Date of Service: Vincent [name], Vincent [name] 04/16/2019 1:15 PM Medical Record [id number] Patient Account Number: [id number] Date of Birth/Sex: Treating RN: 1965-12-15 (53 y.o. Male) Baruch Gouty Primary Care Provider: Hayden Rasmussen Other Clinician: Referring Provider: Treating Provider/Extender:Stone III, Ileene Musa, Penelope Galas in Treatment: 0 History of Present Illness HPI Description: 04/16/2019 upon evaluation today patient presents for initial inspection here in our clinic concerning wounds he has had present for the past 2 and half months. Currently he has been attempting to try different various treatments as recommended by his primary care provider. Unfortunately they have not been able to get things moving along as far as getting specific dressings together. Currently he has been using Neosporin since that was the one thing that they were able to work out at home. He does have home health although the only came out once and they stated they needed updated orders from today before they could do anything else. His primary care provider did attempt Santyl but unfortunately was having trouble with that then wrote a prescription for  Prisma but again the patient was not able to get that filled as its not really a prescription of just a matter of getting insurance to cover it again they did not know the method by which to order that it seems. Either way that is where home health came into play and right now they are somewhat stalled as well. The patient has been on doxycycline which he took for a week he was also given 2 shots of Rocephin. He is a diabetic with a hemoglobin A1c of 10.3. He also is a current 2 pack/day smoker. He does have a history significant for diabetes mellitus type 2, COPD, seizures, hypertension, and nicotine dependence. Currently the patient tells me that overall he has been trying to manage this on his own with his wife's help and I do not believe they have been doing a poor job at all but again I do believe we may be able to do some things to help him out. He also tells me he is attempted to quit smoking in the past he just had a very hard time of it. He is even tried over-the-counter remedies as well as prescription in the past. He did not tell me specifically what. Electronic Signature(s) Signed: 04/16/2019 7:15:35 PM By: Worthy Keeler PA-C Entered By: Worthy Keeler on 04/16/2019 19:15:34 -------------------------------------------------------------------------------- Physical Exam Details Patient Name: Date of Service: Vincent [name], Vincent [name] 04/16/2019 1:15 PM Medical Record [id number] Patient Account Number: [id number] Date of Birth/Sex: Treating RN: [birth year] (53 y.o. Male) Baruch Gouty Primary Care Provider: Hayden Rasmussen Other Clinician: Referring Provider: Treating Provider/Extender:Stone III, Ileene Musa, Penelope Galas in Treatment: 0 Constitutional patient is hypertensive.. pulse regular and within target range for patient.Marland Kitchen respirations regular, non-labored and within target range for patient.Marland Kitchen temperature within target range for patient.. Well-nourished and well-hydrated in  no acute distress. Eyes conjunctiva clear no eyelid  edema noted. pupils equal round and reactive to light and accommodation. Ears, Nose, Mouth, and Throat no gross abnormality of ear auricles or external auditory canals. normal hearing noted during conversation. mucus membranes moist. Respiratory normal breathing without difficulty. Cardiovascular 2+ dorsalis pedis/posterior tibialis pulses. no clubbing, cyanosis, significant edema, <3 sec cap refill. Gastrointestinal (GI) soft, non-tender, non-distended, +BS. no ventral hernia noted. Musculoskeletal normal gait and posture. no significant deformity or arthritic changes, no loss or range of motion, no clubbing. Psychiatric this patient is able to make decisions and demonstrates good insight into disease process. Alert and Oriented x 3. pleasant and cooperative. Notes Patient's wound currently did actually show some signs of being somewhat dry but there was also evidence that he had good granulation tissue as well as epithelial tissue that seems in general be doing quite well and healing to some degree. I am very pleased in this regard. Fortunately there is no signs of active infection at this time and he has no significant lower extremity edema at this point either. Electronic Signature(s) Signed: 04/16/2019 7:16:07 PM By: Worthy Keeler PA-C Entered By: Worthy Keeler on 04/16/2019 19:16:07 -------------------------------------------------------------------------------- Physician Orders Details Patient Name: Date of Service: Vincent [name], Vincent [name] 04/16/2019 1:15 PM Medical Record [id number] Patient Account Number: [id number] Date of Birth/Sex: Treating RN: June 06, 1965 (54 y.o. Male) Baruch Gouty Primary Care Provider: Hayden Rasmussen Other Clinician: Referring Provider: Treating Provider/Extender:Stone III, Ileene Musa, Penelope Galas in Treatment: 0 Verbal / Phone Orders: No Diagnosis Coding ICD-10 Coding Code  Description E11.622 Type 2 diabetes mellitus with other skin ulcer L97.812 Non-pressure chronic ulcer of other part of right lower leg with fat layer exposed J44.9 Chronic obstructive pulmonary disease, unspecified G40.89 Other seizures I10 Essential (primary) hypertension F17.218 Nicotine dependence, cigarettes, with other nicotine-induced disorders Follow-up Appointments Return Appointment in 1 week. Dressing Change Frequency Wound #1 Right,Posterior Lower Leg Change Dressing every other day. Wound Cleansing Wound #1 Right,Posterior Lower Leg May shower and wash wound with soap and water. Primary Wound Dressing Wound #1 Right,Posterior Lower Leg Silver Collagen - moisten with hydrogel or KY gel Secondary Dressing Wound #1 Right,Posterior Lower Leg Foam [place] skilled nursing for wound care. - [facility], [facility]) Signed: 04/16/2019 6:36:45 PM By: Baruch Gouty RN, BSN Signed: 04/16/2019 7:52:58 PM By: Worthy Keeler PA-C Entered By: Baruch Gouty on 04/16/2019 15:08:52 -------------------------------------------------------------------------------- Problem List Details Patient Name: Date of Service: Vincent [name], Vincent [name] 04/16/2019 1:15 PM Medical Record [place]:[id number] Patient Account Number: [id number] Date of Birth/Sex: Treating RN: 09-Mar-1966 (53 y.o. Male) Baruch Gouty Primary Care Provider: Hayden Rasmussen Other Clinician: Referring Provider: Treating Provider/Extender:Stone III, Ileene Musa, Penelope Galas in Treatment: 0 Active Problems ICD-10 Evaluated Encounter Code Description Active Date Today Diagnosis E11.622 Type 2 diabetes mellitus with other skin ulcer 04/16/2019 No Yes L97.812 Non-pressure chronic ulcer of other part of right lower 04/16/2019 No Yes leg with fat layer exposed J44.9 Chronic obstructive pulmonary disease, unspecified 04/16/2019 No Yes G40.89 Other seizures 04/16/2019 No Yes I10 Essential (primary)  hypertension 04/16/2019 No Yes F17.218 Nicotine dependence, cigarettes, with other nicotine- 04/16/2019 No Yes induced disorders Inactive Problems Resolved Problems Electronic Signature(s) Signed: 04/16/2019 2:58:05 PM By: Worthy Keeler PA-C Entered By: Worthy Keeler on 04/16/2019 14:58:05 -------------------------------------------------------------------------------- Progress Note Details Patient Name: Date of Service: Vincent [name], Vincent [name] 04/16/2019 1:15 PM Medical Record [id number] Patient Account Number: [id number] Date of Birth/Sex: Treating RN: [birth year] (54 y.o. Male) Baruch Gouty Primary Care Provider: Horald Pollen  L Other Clinician: Referring Provider: Treating Provider/Extender:Stone III, Ileene Musa, Maebelle Munroe Weeks in Treatment: 0 Subjective Chief Complaint Information obtained from Patient Right LE Ulcer History of Present Illness (HPI) 04/16/2019 upon evaluation today patient presents for initial inspection here in our clinic concerning wounds he has had present for the past 2 and half months. Currently he has been attempting to try different various treatments as recommended by his primary care provider. Unfortunately they have not been able to get things moving along as far as getting specific dressings together. Currently he has been using Neosporin since that was the one thing that they were able to work out at home. He does have home health although the only came out once and they stated they needed updated orders from today before they could do anything else. His primary care provider did attempt Santyl but unfortunately was having trouble with that then wrote a prescription for Prisma but again the patient was not able to get that filled as its not really a prescription of just a matter of getting insurance to cover it again they did not know the method by which to order that it seems. Either way that is where home health came into play and right now they are  somewhat stalled as well. The patient has been on doxycycline which he took for a week he was also given 2 shots of Rocephin. He is a diabetic with a hemoglobin A1c of 10.3. He also is a current 2 pack/day smoker. He does have a history significant for diabetes mellitus type 2, COPD, seizures, hypertension, and nicotine dependence. Currently the patient tells me that overall he has been trying to manage this on his own with his wife's help and I do not believe they have been doing a poor job at all but again I do believe we may be able to do some things to help him out. He also tells me he is attempted to quit smoking in the past he just had a very hard time of it. He is even tried over-the-counter remedies as well as prescription in the past. He did not tell me specifically what. Patient History Information obtained from Patient. Allergies hydrocodone (Severity: Moderate, Reaction: rash), Celebrex (Severity: Severe, Reaction: Anaphylaxis), Sulfa (Sulfonamide Antibiotics) (Severity: Moderate, Reaction: rash) Family History Diabetes - Father,Paternal Grandparents, Heart Disease - Father,Paternal Grandparents, Hypertension - Father,Paternal Grandparents, Kidney Disease - Father, Stroke - Paternal Grandparents,Father, Thyroid Problems - Father,Siblings, No family history of Cancer, Hereditary Spherocytosis, Lung Disease, Seizures. Social History Current every day smoker - 2 packs per day, Marital Status - Married, Alcohol Use - Never, Drug Use - No History, Caffeine Use - Rarely. Medical History Hematologic/Lymphatic Patient has history of Anemia Respiratory Patient has history of Chronic Obstructive Pulmonary Disease (COPD) Cardiovascular Patient has history of Coronary Artery Disease, Hypertension Endocrine Patient has history of Type II Diabetes Musculoskeletal Patient has history of Rheumatoid Arthritis Denies history of Osteoarthritis Neurologic Patient has history of Neuropathy,  Seizure Disorder - Epilepsy Oncologic Patient has history of Received Chemotherapy Denies history of Received Radiation Patient is treated with Insulin, Oral Agents. Blood sugar is tested. Medical And Surgical History Notes Cardiovascular Hyperlipidemia Gastrointestinal GERD, Hiatal hernia Endocrine Hypothyroidism Musculoskeletal degenerative disc disease Neurologic TIA x 2 Oncologic Bladder cancer Psychiatric Bipolar Review of Systems (ROS) Constitutional Symptoms (General Health) Denies complaints or symptoms of Fatigue, Fever, Chills, Marked Weight Change. Eyes Denies complaints or symptoms of Dry Eyes, Vision Changes, Glasses / Contacts. Ear/Nose/Mouth/Throat Denies complaints or  symptoms of Chronic sinus problems or rhinitis. Respiratory Denies complaints or symptoms of Chronic or frequent coughs, Shortness of Breath. Gastrointestinal Denies complaints or symptoms of Frequent diarrhea, Nausea, Vomiting. Genitourinary Denies complaints or symptoms of Frequent urination. Integumentary (Skin) Complains or has symptoms of Wounds - wound on right lower leg. Psychiatric Denies complaints or symptoms of Claustrophobia, Suicidal. Objective Constitutional patient is hypertensive.. pulse regular and within target range for patient.Marland Kitchen respirations regular, non-labored and within target range for patient.Marland Kitchen temperature within target range for patient.. Well-nourished and well-hydrated in no acute distress. Vitals Time Taken: 1:50 PM, Height: 72 in, Source: Stated, Weight: 217 lbs, Source: Stated, BMI: 29.4, Temperature: 98.3 F, Pulse: 93 bpm, Respiratory Rate: 18 breaths/min, Blood Pressure: 161/71 mmHg, Capillary Blood Glucose: 186 mg/dl. General Notes: glucose per pt report Eyes conjunctiva clear no eyelid edema noted. pupils equal round and reactive to light and accommodation. Ears, Nose, Mouth, and Throat no gross abnormality of ear auricles or external auditory canals.  normal hearing noted during conversation. mucus membranes moist. Respiratory normal breathing without difficulty. Cardiovascular 2+ dorsalis pedis/posterior tibialis pulses. no clubbing, cyanosis, significant edema, Gastrointestinal (GI) soft, non-tender, non-distended, +BS. no ventral hernia noted. Musculoskeletal normal gait and posture. no significant deformity or arthritic changes, no loss or range of motion, no clubbing. Psychiatric this patient is able to make decisions and demonstrates good insight into disease process. Alert and Oriented x 3. pleasant and cooperative. General Notes: Patient's wound currently did actually show some signs of being somewhat dry but there was also evidence that he had good granulation tissue as well as epithelial tissue that seems in general be doing quite well and healing to some degree. I am very pleased in this regard. Fortunately there is no signs of active infection at this time and he has no significant lower extremity edema at this point either. Integumentary (Hair, Skin) Wound #1 status is Open. Original cause of wound was Trauma. The wound is located on the Right,Posterior Lower Leg. The wound measures 7.2cm length x 3cm width x 0.1cm depth; 16.965cm^2 area and 1.696cm^3 volume. There is Fat Layer (Subcutaneous Tissue) Exposed exposed. There is no tunneling or undermining noted. There is a medium amount of serosanguineous drainage noted. The wound margin is flat and intact. There is large (67-100%) red, pink granulation within the wound bed. There is a small (1-33%) amount of necrotic tissue within the wound bed including Adherent Slough. Assessment Active Problems ICD-10 Type 2 diabetes mellitus with other skin ulcer Non-pressure chronic ulcer of other part of right lower leg with fat layer exposed Chronic obstructive pulmonary disease, unspecified Other seizures Essential (primary) hypertension Nicotine dependence, cigarettes, with  other nicotine-induced disorders Plan Follow-up Appointments: Return Appointment in 1 week. Dressing Change Frequency: Wound #1 Right,Posterior Lower Leg: Change Dressing every other day. Wound Cleansing: Wound #1 Right,Posterior Lower Leg: May shower and wash wound with soap and water. Primary Wound Dressing: Wound #1 Right,Posterior Lower Leg: Silver Collagen - moisten with hydrogel or KY gel Secondary Dressing: Wound #1 Right,Posterior Lower Leg: Foam [place]: [place] skilled [facility] for wound care. - [facility]. My suggestion based on what I am seeing currently is good to be that we go ahead and initiate treatment with Prisma I think this is probably can be best I would recommend hydrogel and a occlusive adhesive dressing to be used over top of this to keep it moist and allow the wound the best chance of healing. 2. I am also going to  suggest that the patient can wash the area with mild soap and water such as Dial antibacterial soap. 3. I am get a suggest as well that we can send these orders to home health I think that is appropriate and hopefully they will be able to help him with dressing changes as well. If they decide they want to do this on their own we can do that to but again with him already having a physical therapist come out I think that we can have a hard time ordering supplies for him as it is. We will see patient back for reevaluation in 1 week here in the clinic. If anything worsens or changes patient will contact our office for additional recommendations. Electronic Signature(s) Signed: 04/16/2019 7:19:42 PM By: Worthy Keeler PA-C Entered By: Worthy Keeler on 04/16/2019 19:19:42 -------------------------------------------------------------------------------- HxROS Details Patient Name: Date of Service: Vincent [name], Vincent [name] 04/16/2019 1:15 PM Medical Record [id number] Patient Account Number: [id number] Date of Birth/Sex: Treating  RN: September 14, 1965 (53 y.o. Male) Levan Hurst Primary Care Provider: Hayden Rasmussen Other Clinician: Referring Provider: Treating Provider/Extender:Stone III, Ileene Musa, Penelope Galas in Treatment: 0 Information Obtained From Patient Constitutional Symptoms (General Health) Complaints and Symptoms: Negative for: Fatigue; Fever; Chills; Marked Weight Change Eyes Complaints and Symptoms: Negative for: Dry Eyes; Vision Changes; Glasses / Contacts Ear/Nose/Mouth/Throat Complaints and Symptoms: Negative for: Chronic sinus problems or rhinitis Respiratory Complaints and Symptoms: Negative for: Chronic or frequent coughs; Shortness of Breath Medical History: Positive for: Chronic Obstructive Pulmonary Disease (COPD) Gastrointestinal Complaints and Symptoms: Negative for: Frequent diarrhea; Nausea; Vomiting Medical History: Past Medical History Notes: GERD, Hiatal hernia Genitourinary Complaints and Symptoms: Negative for: Frequent urination Integumentary (Skin) Complaints and Symptoms: Positive for: Wounds - wound on right lower leg Psychiatric Complaints and Symptoms: Negative for: Claustrophobia; Suicidal Medical History: Past Medical History Notes: Bipolar Hematologic/Lymphatic Medical History: Positive for: Anemia Cardiovascular Medical History: Positive for: Coronary Artery Disease; Hypertension Past Medical History Notes: Hyperlipidemia Endocrine Medical History: Positive for: Type II Diabetes Past Medical History Notes: Hypothyroidism Time with diabetes: since 55 years old Treated with: Insulin, Oral agents Blood sugar tested every day: Yes Tested : 2 times a day Immunological Musculoskeletal Medical History: Positive for: Rheumatoid Arthritis Negative for: Osteoarthritis Past Medical History Notes: degenerative disc disease Neurologic Medical History: Positive for: Neuropathy; Seizure Disorder - Epilepsy Past Medical History Notes: TIA x  2 Oncologic Medical History: Positive for: Received Chemotherapy Negative for: Received Radiation Past Medical History Notes: Bladder cancer Immunizations Pneumococcal Vaccine: Received Pneumococcal Vaccination: Yes Implantable Devices None Family and Social History Cancer: No; Diabetes: Yes - Father,Paternal Grandparents; Heart Disease: Yes - Father,Paternal Grandparents; Hereditary Spherocytosis: No; Hypertension: Yes - Father,Paternal Grandparents; Kidney Disease: Yes - Father; Lung Disease: No; Seizures: No; Stroke: Yes - Paternal Grandparents,Father; Thyroid Problems: Yes - Father,Siblings; Current every day smoker - 2 packs per day; Marital Status - Married; Alcohol Use: Never; Drug Use: No History; Caffeine Use: Rarely; Financial Concerns: No; Food, Clothing or Shelter Needs: No; Support System Lacking: No; Transportation Concerns: No Electronic Signature(s) Signed: 04/16/2019 7:52:58 PM By: Worthy Keeler PA-C Signed: 04/22/2019 5:30:42 PM By: Levan Hurst RN, BSN Entered By: Levan Hurst on 04/16/2019 14:41:54 -------------------------------------------------------------------------------- [place] Details Patient Name: Date of Service: Vincent [name], Vincent [name] 04/16/2019 Medical Record [id number] Patient Account Number: [id number] Date of Birth/Sex: Treating RN: 07-Dec-1965 (53 y.o. Male) Baruch Gouty Primary Care Provider: Hayden Rasmussen Other Clinician: Referring Provider: Treating Provider/Extender:Stone III, Ileene Musa, Maebelle Munroe Weeks in Treatment: 0  Diagnosis Coding ICD-10 Codes Code Description E11.622 Type 2 diabetes mellitus with other skin ulcer L97.812 Non-pressure chronic ulcer of other part of right lower leg with fat layer exposed J44.9 Chronic obstructive pulmonary disease, unspecified G40.89 Other seizures I10 Essential (primary) hypertension F17.218 Nicotine dependence, cigarettes, with other nicotine-induced disorders Facility Procedures CPT4  Code: [id number] Description: 99214 - WOUND CARE VISIT-LEV 4 EST PT Modifier: Quantity: 1 Physician Procedures CPT4 Code Description: [id number] WC PHYS LEVEL 3 NEW PT ICD-10 Diagnosis Description E11.622 Type 2 diabetes mellitus with other skin ulcer L97.812 Non-pressure chronic ulcer of other part of right lowe J44.9 Chronic obstructive pulmonary disease,  unspecified G40.89 Other seizures Modifier: r leg with fat la Quantity: 1 yer exposed Electronic Signature(s) Signed: 04/16/2019 7:20:19 PM By: Worthy Keeler PA-C Previous Signature: 04/16/2019 6:36:45 PM Version By: Baruch Gouty RN, BSN Entered By: Worthy Keeler on 04/16/2019 19:20:19

## 2019-04-22 NOTE — Progress Notes (Signed)
Vincent [name], Vincent [name] ([id number]) Visit Report for 04/16/2019 Allergy List Details Patient Name: Date of Service: Vincent [name], Vincent [name] 04/16/2019 1:15 PM Medical Record [id number] Patient Account Number: [id number] Date of Birth/Sex: Treating RN: 1965-08-12 (54 y.o. Male) Levan Hurst Primary Care Shaquela Weichert: Hayden Rasmussen Other Clinician: Referring Lovene Maret: Treating Markian Glockner/Extender:Stone III, Ileene Musa, Maebelle Munroe Weeks in Treatment: 0 Allergies Active Allergies hydrocodone Reaction: rash Severity: Moderate Celebrex Reaction: Anaphylaxis Severity: Severe Sulfa (Sulfonamide Antibiotics) Reaction: rash Severity: Moderate Allergy Notes Electronic Signature(s) Signed: 04/22/2019 5:30:42 PM By: Levan Hurst RN, BSN Entered By: Levan Hurst on 04/16/2019 13:56:29 -------------------------------------------------------------------------------- Arrival Information Details Patient Name: Date of Service: Vincent [name], Vincent [name] 04/16/2019 1:15 PM Medical Record [id number] Patient Account Number: [id number] Date of Birth/Sex: Treating RN: 04/30/1965 (53 y.o. Male) Levan Hurst Primary Care Menaal Russum: Hayden Rasmussen Other Clinician: Referring Yi Haugan: Treating Ketura Sirek/Extender:Stone III, Ileene Musa, Penelope Galas in Treatment: 0 Visit Information Patient Arrived: Cane Arrival Time: 13:45 Accompanied By: wife Transfer Assistance: None Patient Identification Verified: Yes Secondary Verification Process Yes Completed: Patient Requires Transmission-Based No Precautions: Patient Has Alerts: Yes Patient Alerts: R ABI 1.25 TBI 0.94 L ABI 1.28 TBI 1.28 Electronic Signature(s) Signed: 04/22/2019 5:30:42 PM By: Levan Hurst RN, BSN Entered By: Levan Hurst on 04/16/2019 14:02:06 -------------------------------------------------------------------------------- Clinic Level of Care Assessment Details Patient Name: Date of Service: Vincent [name], Vincent [name] 04/16/2019 1:15  PM Medical Record [id number] Patient Account Number: [id number] Date of Birth/Sex: Treating RN: [birth year] (53 y.o. Male) Baruch Gouty Primary Care Chaniqua Brisby: Hayden Rasmussen Other Clinician: Referring Lindsay Straka: Treating Alexandera Kuntzman/Extender:Stone III, Ileene Musa, Penelope Galas in Treatment: 0 Clinic Level of Care Assessment Items TOOL 2 Quantity Score []  - Use when only an EandM is performed on the INITIAL visit 0 ASSESSMENTS - Nursing Assessment / Reassessment X - General Physical Exam (combine w/ comprehensive assessment (listed just below) 1 20 when performed on new pt. evals) X - Comprehensive Assessment (HX, ROS, Risk Assessments, Wounds Hx, etc.) 1 25 ASSESSMENTS - Wound and Skin Assessment / Reassessment X - Simple Wound Assessment / Reassessment - one wound 1 5 []  - Complex Wound Assessment / Reassessment - multiple wounds 0 []  - Dermatologic / Skin Assessment (not related to wound area) 0 ASSESSMENTS - Ostomy and/or Continence Assessment and Care []  - Incontinence Assessment and Management 0 []  - Ostomy Care Assessment and Management (repouching, etc.) 0 PROCESS - Coordination of Care X - Simple Patient / Family Education for ongoing care 1 15 []  - Complex (extensive) Patient / Family Education for ongoing care 0 X - Staff obtains Programmer, systems, Records, Test Results / Process Orders 1 10 X - Staff telephones HHA, Nursing Homes / Clarify orders / etc 1 10 []  - Routine Transfer to another Facility (non-emergent condition) 0 []  - Routine Hospital Admission (non-emergent condition) 0 X - New Admissions / Biomedical engineer / Ordering NPWT, Apligraf, etc. 1 15 []  - Emergency Hospital Admission (emergent condition) 0 X - Simple Discharge Coordination 1 10 []  - Complex (extensive) Discharge Coordination 0 PROCESS - Special Needs []  - Pediatric / Minor Patient Management 0 []  - Isolation Patient Management 0 []  - Hearing / Language / Visual special needs 0 []  -  Assessment of Community assistance (transportation, D/C planning, etc.) 0 []  - Additional assistance / Altered mentation 0 []  - Support Surface(s) Assessment (bed, cushion, seat, etc.) 0 INTERVENTIONS - Wound Cleansing / Measurement X - Wound Imaging (photographs - any number of wounds) 1 5 []  - Wound Tracing (instead of photographs) 0  X - Simple Wound Measurement - one wound 1 5 []  - Complex Wound Measurement - multiple wounds 0 X - Simple Wound Cleansing - one wound 1 5 []  - Complex Wound Cleansing - multiple wounds 0 INTERVENTIONS - Wound Dressings X - Small Wound Dressing one or multiple wounds 1 10 []  - Medium Wound Dressing one or multiple wounds 0 []  - Large Wound Dressing one or multiple wounds 0 []  - Application of Medications - injection 0 INTERVENTIONS - Miscellaneous []  - External ear exam 0 []  - Specimen Collection (cultures, biopsies, blood, body fluids, etc.) 0 []  - Specimen(s) / Culture(s) sent or taken to Lab for analysis 0 []  - Patient Transfer (multiple staff / Civil Service fast streamer / Similar devices) 0 []  - Simple Staple / Suture removal (25 or less) 0 []  - Complex Staple / Suture removal (26 or more) 0 []  - Hypo / Hyperglycemic Management (close monitor of Blood Glucose) 0 []  - Ankle / Brachial Index (ABI) - do not check if billed separately 0 Has the patient been seen at the hospital within the last three years: Yes Total Score: 135 Level Of Care: New/Established - Level 4 Electronic Signature(s) Signed: 04/16/2019 6:36:45 PM By: Baruch Gouty RN, BSN Entered By: Baruch Gouty on 04/16/2019 15:09:51 -------------------------------------------------------------------------------- Encounter Discharge Information Details Patient Name: Date of Service: Vincent [name], Vincent [name] 04/16/2019 1:15 PM Medical Record [id number] Patient Account Number: [id number] Date of Birth/Sex: Treating RN: 1965-05-05 (53 y.o. Male) Carlene Coria Primary Care Kerington Hildebrant: Hayden Rasmussen Other  Clinician: Referring Tanay Massiah: Treating Kyree Adriano/Extender:Stone III, Ileene Musa, Penelope Galas in Treatment: 0 Encounter Discharge Information Items Discharge Condition: Stable Ambulatory Status: Cane Discharge Destination: Home Transportation: Private Auto Accompanied By: wife Schedule Follow-up Appointment: Yes Clinical Summary of Care: Patient Declined Electronic Signature(s) Signed: 04/18/2019 5:25:54 PM By: Carlene Coria RN Entered By: Carlene Coria on 04/16/2019 15:25:53 -------------------------------------------------------------------------------- Lower Extremity Assessment Details Patient Name: Date of Service: Vincent [name], Vincent [name] 04/16/2019 1:15 PM Medical Record [id number] Patient Account Number: [id number] Date of Birth/Sex: Treating RN: 11/13/1965 (53 y.o. Male) Levan Hurst Primary Care [place] Pelto: Hayden Rasmussen Other Clinician: Referring Taris Galindo: Treating Jabrea Kallstrom/Extender:Stone III, Ileene Musa, Maebelle Munroe Weeks in Treatment: 0 Edema Assessment Assessed: [Left: No] [Right: No] Edema: [Left: N] [Right: o] Calf Left: Right: Point of Measurement: cm From Medial Instep cm 36 cm Ankle Left: Right: Point of Measurement: cm From Medial Instep cm 21.5 cm Vascular Assessment Pulses: Dorsalis Pedis Palpable: [Right:Yes] Electronic Signature(s) Signed: 04/22/2019 5:30:42 PM By: Levan Hurst RN, BSN Entered By: Levan Hurst on 04/16/2019 14:21:52 -------------------------------------------------------------------------------- Multi-Disciplinary Care Plan Details Patient Name: Date of Service: Vincent [name], Vincent [name] 04/16/2019 1:15 PM Medical Record [place]:[id number] Patient Account Number: [id number] Date of Birth/Sex: Treating RN: 03-22-1965 (53 y.o. Male) Baruch Gouty Primary Care Jalynn Waddell: Hayden Rasmussen Other Clinician: Referring Kory Rains: Treating Clyde Upshaw/Extender:Stone III, Ileene Musa, Penelope Galas in Treatment: 0 Active Inactive Abuse / Safety /  Falls / Self Care Management Nursing Diagnoses: Potential for falls Goals: Patient/caregiver will verbalize/demonstrate measures taken to prevent injury and/or falls Date Initiated: 04/16/2019 Target Resolution Date: 05/14/2019 Goal Status: Active Interventions: Assess fall risk on admission and as needed Assess personal safety and home safety (as indicated) on admission and as needed Notes: Nutrition Nursing Diagnoses: Impaired glucose control: actual or potential Goals: Patient/caregiver will maintain therapeutic glucose control Date Initiated: 04/16/2019 Target Resolution Date: 05/14/2019 Goal Status: Active Interventions: Assess HgA1c results as ordered upon admission and as needed Assess patient nutrition upon admission and as needed per  policy Treatment Activities: Patient referred to Primary Care Physician for further nutritional evaluation : 04/16/2019 Notes: Wound/Skin Impairment Nursing Diagnoses: Impaired tissue integrity Knowledge deficit related to smoking impact on wound healing Knowledge deficit related to ulceration/compromised skin integrity Goals: Patient will demonstrate a reduced rate of smoking or cessation of smoking Date Initiated: 04/16/2019 Target Resolution Date: 05/14/2019 Goal Status: Active Patient/caregiver will verbalize understanding of skin care regimen Date Initiated: 04/16/2019 Target Resolution Date: 05/14/2019 Goal Status: Active Ulcer/skin breakdown will have a volume reduction of 30% by week 4 Date Initiated: 04/16/2019 Target Resolution Date: 05/14/2019 Goal Status: Active Interventions: Assess patient/caregiver ability to obtain necessary supplies Assess patient/caregiver ability to perform ulcer/skin care regimen upon admission and as needed Assess ulceration(s) every visit Provide education on smoking Provide education on ulcer and skin care Treatment Activities: Skin care regimen initiated : 04/16/2019 Topical wound management initiated :  04/16/2019 Notes: Electronic Signature(s) Signed: 04/16/2019 6:36:45 PM By: Baruch Gouty RN, BSN Entered By: Baruch Gouty on 04/16/2019 15:03:03 -------------------------------------------------------------------------------- Pain Assessment Details Patient Name: Date of Service: Vincent [name], Vincent [name] 04/16/2019 1:15 PM Medical Record [id number] Patient Account Number: [id number] Date of Birth/Sex: Treating RN: 05-22-1965 (53 y.o. Male) Levan Hurst Primary Care Dawit Tankard: Hayden Rasmussen Other Clinician: Referring Gustabo Gordillo: Treating Stephaniemarie Stoffel/Extender:Stone III, Ileene Musa, Penelope Galas in Treatment: 0 Active Problems Location of Pain Severity and Description of Pain Patient Has Paino No Site Locations Pain Management and Medication Current Pain Management: Electronic Signature(s) Signed: 04/22/2019 5:30:42 PM By: Levan Hurst RN, BSN Entered By: Levan Hurst on 04/16/2019 14:23:47 -------------------------------------------------------------------------------- Patient/Caregiver Education Details Patient Name: Date of Service: Vincent [name], Vincent [name] 2/3/2021andnbsp1:15 PM Medical Record Patient Account Number: [id number] Number: Treating RN: Baruch Gouty Date of Birth/Gender: 1965-04-05 (54 y.o. Male) Other Clinician: Primary Care Physician: Hayden Rasmussen Treating Worthy Keeler Referring Physician: Physician/Extender: Salli Real in Treatment: 0 Education Assessment Education Provided To: Patient Education Topics Provided Elevated Blood Sugar/ Impact on Healing: Handouts: Elevated Blood Sugars: How Do They Affect Wound Healing Methods: Explain/Verbal, Printed Responses: Reinforcements needed, State content correctly Smoking and Wound Healing: Welcome To [facility] [place]: Handouts: Welcome To [facility] [place] Methods: Explain/Verbal, Printed Responses: Reinforcements needed, State content correctly Wound/Skin  Impairment: Handouts: Caring for Your Ulcer, Skin Care Do's and Dont's, Smoking and Wound Healing Methods: Explain/Verbal, Printed Responses: Reinforcements needed, State content correctly Electronic Signature(s) Signed: 04/16/2019 6:36:45 PM By: Baruch Gouty RN, BSN Entered By: Baruch Gouty on 04/16/2019 15:03:49 -------------------------------------------------------------------------------- Wound Assessment Details Patient Name: Date of Service: Vincent [name], Vincent [name] 04/16/2019 1:15 PM Medical Record [id number] Patient Account Number: [id number] Date of Birth/Sex: Treating RN: 02-15-1966 (53 y.o. Male) Baruch Gouty Primary Care Effie Janoski: Hayden Rasmussen Other Clinician: Referring Ayesha Markwell: Treating Lindyn Vossler/Extender:Stone III, Ileene Musa, Maebelle Munroe Weeks in Treatment: 0 Wound Status Wound Number: 1 Primary Diabetic Wound/Ulcer of the Lower Extremity Etiology: Wound Location: Right Lower Leg - Posterior Wound Open Wounding Event: Trauma Status: Date Acquired: 03/14/2019 Comorbid Anemia, Chronic Obstructive Pulmonary Weeks Of Treatment: 0 History: Disease (COPD), Coronary Artery Disease, Clustered Wound: No Hypertension, Type II Diabetes, Rheumatoid Arthritis, Neuropathy, Seizure Disorder, Received Chemotherapy Photos Wound Measurements Length: (cm) 7.2 Width: (cm) 3 Depth: (cm) 0.1 Area: (cm) 16.965 Volume: (cm) 1.696 Wound Description Classification: Grade 2 Wound Margin: Flat and Intact Exudate Amount: Medium Exudate Type: Serosanguineous Exudate Color: red, brown Wound Bed Granulation Amount: Large (67-100%) Granulation Quality: Red, Pink Necrotic Amount: Small (1-33%) Necrotic Quality: Adherent Slough ter Cleansing: No no Yes Exposed  Structure d: No bcutaneous Tissue) Exposed: Yes d: No d: No : No No % Reduction in Area: 0% % Reduction in Volume: 0% Epithelialization: None Tunneling: No Undermining: No Foul Odor Af Slough/Fibri Fascia  Expose Fat Layer (Su Tendon Expose Muscle Expose Joint Exposed Bone Exposed: Treatment Notes Wound #1 (Right, Posterior Lower Leg) 1. Cleanse With Wound Cleanser 3. Primary Dressing Applied [employer] 4. Secondary Dressing Foam Border Dressing Electronic Signature(s) Signed: 04/17/2019 4:25:19 PM By: Mikeal Hawthorne EMT/HBOT Signed: 04/17/2019 5:40:52 PM By: Baruch Gouty RN, BSN Entered By: Mikeal Hawthorne on 04/17/2019 14:35:07 -------------------------------------------------------------------------------- Vitals Details Patient Name: Date of Service: Vincent [name], Vincent [name] 04/16/2019 1:15 PM Medical Record [place]:[id number] Patient Account Number: [id number] Date of Birth/Sex: Treating RN: 07/06/1965 (54 y.o. Male) Levan Hurst Primary Care Lejuan Botto: Other Clinician: Hayden Rasmussen Referring Kaprice Kage: Treating Leeum Sankey/Extender:Stone III, Ileene Musa, Penelope Galas in Treatment: 0 Vital Signs Time Taken: 13:50 Temperature (F): 98.3 Height (in): 72 Pulse (bpm): 93 Source: Stated Respiratory Rate (breaths/min): 18 Weight (lbs): 217 Blood Pressure (mmHg): 161/71 Source: Stated Capillary Blood Glucose (mg/dl): 186 Body Mass Index (BMI): 29.4 Reference Range: 80 - 120 mg / dl Notes glucose per pt report Electronic Signature(s) Signed: 04/22/2019 5:30:42 PM By: Levan Hurst RN, BSN Entered By: Levan Hurst on 04/16/2019 13:55:00

## 2019-04-23 ENCOUNTER — Encounter (HOSPITAL_BASED_OUTPATIENT_CLINIC_OR_DEPARTMENT_OTHER): Payer: No Typology Code available for payment source | Attending: Physician Assistant | Admitting: Physician Assistant

## 2019-04-23 ENCOUNTER — Other Ambulatory Visit: Payer: Self-pay

## 2019-04-23 DIAGNOSIS — F17218 Nicotine dependence, cigarettes, with other nicotine-induced disorders: Secondary | ICD-10-CM | POA: Diagnosis not present

## 2019-04-23 DIAGNOSIS — E785 Hyperlipidemia, unspecified: Secondary | ICD-10-CM | POA: Diagnosis not present

## 2019-04-23 DIAGNOSIS — E11622 Type 2 diabetes mellitus with other skin ulcer: Secondary | ICD-10-CM | POA: Diagnosis not present

## 2019-04-23 DIAGNOSIS — L97812 Non-pressure chronic ulcer of other part of right lower leg with fat layer exposed: Secondary | ICD-10-CM | POA: Diagnosis not present

## 2019-04-23 DIAGNOSIS — E1140 Type 2 diabetes mellitus with diabetic neuropathy, unspecified: Secondary | ICD-10-CM | POA: Diagnosis not present

## 2019-04-23 DIAGNOSIS — E039 Hypothyroidism, unspecified: Secondary | ICD-10-CM | POA: Diagnosis not present

## 2019-04-23 DIAGNOSIS — L97212 Non-pressure chronic ulcer of right calf with fat layer exposed: Secondary | ICD-10-CM | POA: Diagnosis not present

## 2019-04-24 ENCOUNTER — Ambulatory Visit (INDEPENDENT_AMBULATORY_CARE_PROVIDER_SITE_OTHER): Payer: No Typology Code available for payment source | Admitting: Neurology

## 2019-04-24 ENCOUNTER — Encounter: Payer: Self-pay | Admitting: Neurology

## 2019-04-24 VITALS — BP 135/80 | HR 99 | Temp 97.7°F | Ht 72.0 in | Wt 212.5 lb

## 2019-04-24 DIAGNOSIS — R269 Unspecified abnormalities of gait and mobility: Secondary | ICD-10-CM | POA: Diagnosis not present

## 2019-04-24 DIAGNOSIS — F3181 Bipolar II disorder: Secondary | ICD-10-CM | POA: Diagnosis not present

## 2019-04-24 DIAGNOSIS — M79604 Pain in right leg: Secondary | ICD-10-CM | POA: Diagnosis not present

## 2019-04-24 DIAGNOSIS — R29898 Other symptoms and signs involving the musculoskeletal system: Secondary | ICD-10-CM

## 2019-04-24 MED ORDER — LAMOTRIGINE 150 MG PO TABS: 150.0000 mg | ORAL_TABLET | Freq: Two times a day (BID) | ORAL | 11 refills | Status: DC

## 2019-04-24 NOTE — Progress Notes (Addendum)
ADARYLL, BANTA ([id number]) Visit Report for 04/23/2019 Chief Complaint Document Details Patient Name: Date of Service: Vincent [name], Vincent [name]. 04/23/2019 10:00 AM Medical Record [id number] Patient Account Number: [id number] Date of Birth/Sex: Treating RN: [birth year] (54 y.o. Ernestene Mention Primary Care Provider: Hayden Rasmussen Other Clinician: Referring Provider: Treating Provider/Extender:Stone III, Ileene Musa, Penelope Galas in Treatment: 1 Information Obtained from: Patient Chief Complaint Right LE Ulcer Electronic Signature(s) Signed: 04/23/2019 10:21:10 AM By: Worthy Keeler PA-C Previous Signature: 04/23/2019 10:20:50 AM Version By: Worthy Keeler PA-C Entered By: Worthy Keeler on 04/23/2019 10:21:10 -------------------------------------------------------------------------------- HPI Details Patient Name: Date of Service: Vincent [name], Vincent [name]. 04/23/2019 10:00 AM Medical Record [id number] Patient Account Number: [id number] Date of Birth/Sex: Treating RN: Aug 20, 1965 (54 y.o. Ernestene Mention Primary Care Provider: Hayden Rasmussen Other Clinician: Referring Provider: Treating Provider/Extender:Stone III, Ileene Musa, Penelope Galas in Treatment: 1 History of Present Illness HPI Description: 04/16/2019 upon evaluation today patient presents for initial inspection here in our clinic concerning wounds he has had present for the past 2 and half months. Currently he has been attempting to try different various treatments as recommended by his primary care provider. Unfortunately they have not been able to get things moving along as far as getting specific dressings together. Currently he has been using Neosporin since that was the one thing that they were able to work out at home. He does have home health although the only came out once and they stated they needed updated orders from today before they could do anything else. His primary care provider did attempt Santyl  but unfortunately was having trouble with that then wrote a prescription for Prisma but again the patient was not able to get that filled as its not really a prescription of just a matter of getting insurance to cover it again they did not know the method by which to order that it seems. Either way that is where home health came into play and right now they are somewhat stalled as well. The patient has been on doxycycline which he took for a week he was also given 2 shots of Rocephin. He is a diabetic with a hemoglobin A1c of 10.3. He also is a current 2 pack/day smoker. He does have a history significant for diabetes mellitus type 2, COPD, seizures, hypertension, and nicotine dependence. Currently the patient tells me that overall he has been trying to manage this on his own with his wife's help and I do not believe they have been doing a poor job at all but again I do believe we may be able to do some things to help him out. He also tells me he is attempted to quit smoking in the past he just had a very hard time of it. He is even tried over-the-counter remedies as well as prescription in the past. He did not tell me specifically what. 04/23/2019 upon evaluation today patient appears to be doing excellent in regard to his wound in fact this has healed tremendously since I last saw him last week. He has been tolerating the dressing changes without complication. In fact the wound is significantly smaller compared to what it was when I last saw him which was the initial visit here in the clinic. Electronic Signature(s) Signed: 04/23/2019 11:13:01 AM By: Worthy Keeler PA-C Entered By: Worthy Keeler on 04/23/2019 11:13:01 -------------------------------------------------------------------------------- Physical Exam Details Patient Name: Date of Service: Vincent [name], Vincent W. 04/23/2019 10:00 AM Medical Record [id number] Patient Account  Number: [id number] Date of Birth/Sex: Treating  RN: [birth year] (54 y.o. Ernestene Mention Primary Care Provider: Hayden Rasmussen Other Clinician: Referring Provider: Treating Provider/Extender:Stone III, Ileene Musa, Penelope Galas in Treatment: 1 Constitutional Well-nourished and well-hydrated in no acute distress. Respiratory normal breathing without difficulty. Psychiatric this patient is able to make decisions and demonstrates good insight into disease process. Alert and Oriented x 3. pleasant and cooperative. Notes Patient shows excellent epithelization and has great healing that is occurred in the past week since I initially saw him. In fact this may be some of the most rapid healing that I have seen in such a small amount of time. I am very pleased and there is no signs of infection. Electronic Signature(s) Signed: 04/23/2019 11:13:31 AM By: Worthy Keeler PA-C Entered By: Worthy Keeler on 04/23/2019 11:13:30 -------------------------------------------------------------------------------- Physician Orders Details Patient Name: Date of Service: Vincent [name], Vincent [name]. 04/23/2019 10:00 AM Medical Record [id number] Patient Account Number: [id number] Date of Birth/Sex: Treating RN: 16-Feb-1966 (54 y.o. Ernestene Mention Primary Care Provider: Other Clinician: Hayden Rasmussen Referring Provider: Treating Provider/Extender:Stone III, Ileene Musa, Penelope Galas in Treatment: 1 Verbal / Phone Orders: No Diagnosis Coding ICD-10 Coding Code Description E11.622 Type 2 diabetes mellitus with other skin ulcer L97.812 Non-pressure chronic ulcer of other part of right lower leg with fat layer exposed J44.9 Chronic obstructive pulmonary disease, unspecified G40.89 Other seizures I10 Essential (primary) hypertension F17.218 Nicotine dependence, cigarettes, with other nicotine-induced disorders Follow-up Appointments Return Appointment in 1 week. Dressing Change Frequency Wound #1 Right,Posterior Lower Leg Change Dressing  every other day. Wound Cleansing Wound #1 Right,Posterior Lower Leg May shower and wash wound with soap and water. Primary Wound Dressing Wound #1 Right,Posterior Lower Leg Silver Collagen - moisten with hydrogel or [place] gel Secondary Dressing Wound #1 Right,Posterior Lower Leg Foam [facility] skilled [facility] for wound care. - [facility], [facility]) Signed: 04/23/2019 5:39:41 PM By: Baruch Gouty RN, BSN Signed: 04/23/2019 6:32:46 PM By: Worthy Keeler PA-C Entered By: Baruch Gouty on 04/23/2019 10:57:11 -------------------------------------------------------------------------------- Problem List Details Patient Name: Date of Service: Vincent [name], Vincent [name]. 04/23/2019 10:00 AM Medical Record [id number] Patient Account Number: [id number] Date of Birth/Sex: Treating RN: 1965/08/31 (54 y.o. Ernestene Mention Primary Care Provider: Hayden Rasmussen Other Clinician: Referring Provider: Treating Provider/Extender:Stone III, Ileene Musa, Penelope Galas in Treatment: 1 Active Problems ICD-10 Evaluated Encounter Code Description Active Date Today Diagnosis E11.622 Type 2 diabetes mellitus with other skin ulcer 04/16/2019 No Yes L97.812 Non-pressure chronic ulcer of other part of right lower 04/16/2019 No Yes leg with fat layer exposed J44.9 Chronic obstructive pulmonary disease, unspecified 04/16/2019 No Yes G40.89 Other seizures 04/16/2019 No Yes I10 Essential (primary) hypertension 04/16/2019 No Yes F17.218 Nicotine dependence, cigarettes, with other nicotine- 04/16/2019 No Yes induced disorders Inactive Problems Resolved Problems Electronic Signature(s) Signed: 04/23/2019 10:20:39 AM By: Worthy Keeler PA-C Entered By: Worthy Keeler on 04/23/2019 10:20:39 -------------------------------------------------------------------------------- Progress Note Details Patient Name: Date of Service: Vincent [name], Vincent [name]. 04/23/2019 10:00 AM Medical Record  [id number] Patient Account Number: [id number] Date of Birth/Sex: Treating RN: [birth year] (54 y.o. Ernestene Mention Primary Care Provider: Hayden Rasmussen Other Clinician: Referring Provider: Treating Provider/Extender:Stone III, Ileene Musa, Penelope Galas in Treatment: 1 Subjective Chief Complaint Information obtained from Patient Right LE Ulcer History of Present Illness (HPI) 04/16/2019 upon evaluation today patient presents for initial inspection here in our clinic concerning wounds he has had present for the past  2 and half months. Currently he has been attempting to try different various treatments as recommended by his primary care provider. Unfortunately they have not been able to get things moving along as far as getting specific dressings together. Currently he has been using Neosporin since that was the one thing that they were able to work out at home. He does have home health although the only came out once and they stated they needed updated orders from today before they could do anything else. His primary care provider did attempt Santyl but unfortunately was having trouble with that then wrote a prescription for Prisma but again the patient was not able to get that filled as its not really a prescription of just a matter of getting insurance to cover it again they did not know the method by which to order that it seems. Either way that is where home health came into play and right now they are somewhat stalled as well. The patient has been on doxycycline which he took for a week he was also given 2 shots of Rocephin. He is a diabetic with a hemoglobin A1c of 10.3. He also is a current 2 pack/day smoker. He does have a history significant for diabetes mellitus type 2, COPD, seizures, hypertension, and nicotine dependence. Currently the patient tells me that overall he has been trying to manage this on his own with his wife's help and I do not believe they have been doing  a poor job at all but again I do believe we may be able to do some things to help him out. He also tells me he is attempted to quit smoking in the past he just had a very hard time of it. He is even tried over-the-counter remedies as well as prescription in the past. He did not tell me specifically what. 04/23/2019 upon evaluation today patient appears to be doing excellent in regard to his wound in fact this has healed tremendously since I last saw him last week. He has been tolerating the dressing changes without complication. In fact the wound is significantly smaller compared to what it was when I last saw him which was the initial visit here in the clinic. Objective Constitutional Well-nourished and well-hydrated in no acute distress. Vitals Time Taken: 10:14 AM, Height: 72 in, Weight: 217 lbs, BMI: 29.4, Temperature: 98.2 F, Pulse: 82 bpm, Respiratory Rate: 18 breaths/min, Blood Pressure: 115/63 mmHg, Capillary Blood Glucose: 196 mg/dl. Respiratory normal breathing without difficulty. Psychiatric this patient is able to make decisions and demonstrates good insight into disease process. Alert and Oriented x 3. pleasant and cooperative. General Notes: Patient shows excellent epithelization and has great healing that is occurred in the past week since I initially saw him. In fact this may be some of the most rapid healing that I have seen in such a small amount of time. I am very pleased and there is no signs of infection. Integumentary (Hair, Skin) Wound #1 status is Open. Original cause of wound was Trauma. The wound is located on the Right,Posterior Lower Leg. The wound measures 0.5cm length x 0.3cm width x 0.1cm depth; 0.118cm^2 area and 0.012cm^3 volume. There is Fat Layer (Subcutaneous Tissue) Exposed exposed. There is no tunneling or undermining noted. There is a medium amount of serosanguineous drainage noted. The wound margin is flat and intact. There is large (67-100%) red,  pink granulation within the wound bed. There is a small (1-33%) amount of necrotic tissue within the wound bed including Adherent  Slough. Assessment Active Problems ICD-10 Type 2 diabetes mellitus with other skin ulcer Non-pressure chronic ulcer of other part of right lower leg with fat layer exposed Chronic obstructive pulmonary disease, unspecified Other seizures Essential (primary) hypertension Nicotine dependence, cigarettes, with other nicotine-induced disorders Plan Follow-up Appointments: Return Appointment in 1 week. Dressing Change Frequency: Wound #1 Right,Posterior Lower Leg: Change Dressing every other day. Wound Cleansing: Wound #1 Right,Posterior Lower Leg: May shower and wash wound with soap and water. Primary Wound Dressing: Wound #1 Right,Posterior Lower Leg: Silver Collagen - moisten with hydrogel or [place] gel Secondary Dressing: Wound #1 Right,Posterior Lower Leg: Foam [place]: [facility] for wound care. - [facility]. I would recommend currently we continue with the collagen my hope is that he will probably be completely healed by next week. 2. I recommend as well continuing with the bordered foam dressing to cover that seems to be doing well for him as well. We will see patient back for reevaluation in 1 week here in the clinic. If anything worsens or changes patient will contact our office for additional recommendations. Electronic Signature(s) Signed: 04/23/2019 11:13:43 AM By: Worthy Keeler PA-C Entered By: Worthy Keeler on 04/23/2019 11:13:42 -------------------------------------------------------------------------------- SuperBill Details Patient Name: Date of Service: Vincent [name], Vincent [name] 04/23/2019 Medical Record [id number] Patient Account Number: [id number] Date of Birth/Sex: Treating RN: 1965-09-08 (54 y.o. Ernestene Mention Primary Care Provider: Hayden Rasmussen Other Clinician: Referring Provider:  Treating Provider/Extender:Stone III, Ileene Musa, Penelope Galas in Treatment: 1 Diagnosis Coding ICD-10 Codes Code Description E11.622 Type 2 diabetes mellitus with other skin ulcer L97.812 Non-pressure chronic ulcer of other part of right lower leg with fat layer exposed J44.9 Chronic obstructive pulmonary disease, unspecified G40.89 Other seizures I10 Essential (primary) hypertension F17.218 Nicotine dependence, cigarettes, with other nicotine-induced disorders Facility Procedures CPT4 Code: [id number] Description: 99213 - WOUND CARE VISIT-LEV 3 EST PT Modifier: Quantity: 1 Physician Procedures CPT4 Code Description: [id number] - WC PHYS LEVEL 3 - EST PT ICD-10 Diagnosis Description E11.622 Type 2 diabetes mellitus with other skin ulcer L97.812 Non-pressure chronic ulcer of other part of right lower J44.9 Chronic obstructive pulmonary  disease, unspecified G40.89 Other seizures Modifier: leg with fat la Quantity: 1 yer exposed Electronic Signature(s) Signed: 04/23/2019 11:13:54 AM By: Worthy Keeler PA-C Entered By: Worthy Keeler on 04/23/2019 11:13:53

## 2019-04-24 NOTE — Progress Notes (Signed)
PATIENT: Vincent [name] DOB: Jun 15, 1965  Chief Complaint  Patient presents with  . Pain    He is here with his wife, Vincent [name]. Reports pain from his right groin down to his ankle. He also has electrical shocking sensations. He has seen orthopaedic and neurosurgery. He has underwent MRI scans that did not provide cause. He had NCV/EMG that indicated diabetic amyotrophy but said he was sent here for further evaluation.  . Seizures    He is still taking Lamictal 150mg , two tablets each morning. He has frequent episodes of full body tremors occurring at night but he is unsure this if is an actual seizure. He is alert and oriented during these events but unable to control the shaking.     HISTORICAL  Vincent [name] is a 54 year old, seen in request by Dr. Tenny Craw for evaluation of right leg pain, gait abnormality, she is accompanied by his wife at today's visit on April 24, 2019.  I have reviewed and summarized the referring note from the referring physician.  He has past medical history of hypertension, hyperlipidemia,  insulin-dependent poorly controlled diabetes, bipolar disorder, history of bladder cancer, chronic pain, is under pain management, longtime smoker,  Our office saw him since 2015, because he had recurrent episode of confusion, some nocturnal seizure-like episode, previous work-up including MRI of the brain, and EEG was normal in 2015.  He was started on lamotrigine, tolerating it well, it has helped his recurrent spells, he is now taking lamotrigine 150 mg daily, last visit with office was in October 2018 with Vincent [name].  Previous EMG nerve conduction study in November 2019 has confirmed length dependent moderate axonal sensorimotor polyneuropathy, consistent with his poorly controlled diabetes  He was able to ambulate without assistant until end of October 2020, woke up 1 morning, noticed deep achy pain at right anterior and medial time, radiating to right medial leg,  and ankle, his pain gradually getting worse, 3 weeks later, he noticed gradual onset right leg weakness, to the point of difficulty picking up his right hip against gravity, at the same time, his right leg pain intensified,  He was seen by neurosurgeon [name], who referred him for EMG nerve conduction study by [name] on April 08, 2019, there is evidence of severe sensorimotor polyneuropathy, in addition, there is evidence of active denervation at right vastus lateralis, medialis, iliopsoas, adductor longus, but there was no paraspinal muscle active denervation's.  Above findings with the possibility of diabetic amyotrophy  Since his symptoms onset end of October 2020, he continue complains of gradual worsening right thigh pain, right leg weakness, gait abnormality.  He is receiving home physical therapy  I was able to review his office visit at [facility] on February 20, 2019, he has been under pain management for extended period of time, previously taking fentanyl 75 mcg per 72 hours, Lyrica 150 mg 3 times daily, previously was a patient of [facility], when that office was closed, he seek pain management at [facility], had his first right sacroiliac joint injection on February 20, 2019 by [name]   Personally reviewed MRIs from [facility] in February 2021 MRI of cervical spine mild degenerative changes, no significant canal or foraminal narrowing MRI of brain: No significant abnormality MRI of lumbar spine in November 2020 it was normal MRI of the brain October 20, 2018 that was normal  Laboratory evaluations in 2020, A1c was 10.3, normal CBC, BMP with exception of elevated  glucose 298,  REVIEW OF SYSTEMS: Full 14 system review of systems performed and notable only for as above All other review of systems were negative.  ALLERGIES: Allergies  Allergen Reactions  . Celebrex [Celecoxib] Anaphylaxis and Rash  . Hydrocodone Rash and Other (See Comments)     "blisters developed on arms"  . Sulfa Antibiotics Rash    HOME MEDICATIONS: Current Outpatient Medications  Medication Sig Dispense Refill  . albuterol (VENTOLIN HFA) 108 (90 Base) MCG/ACT inhaler Inhale 1-2 puffs into the lungs every 6 (six) hours as needed for wheezing or shortness of breath. 18 g 0  . amLODipine (NORVASC) 5 MG tablet Take 5 mg by mouth daily.    . ARIPiprazole (ABILIFY) 5 MG tablet Take 1 tablet (5 mg total) by mouth daily. 30 tablet 2  . atorvastatin (LIPITOR) 40 MG tablet Take 1 tablet (40 mg total) by mouth daily at 6 PM. 30 tablet 1  . busPIRone (BUSPAR) 5 MG tablet Take 1 tablet (5 mg total) by mouth 2 (two) times daily. 60 tablet 2  . Cholecalciferol (VITAMIN D3) 2000 units TABS TAKE 1 TABLET BY MOUTH ONCE DAILY 90 tablet 0  . clopidogrel (PLAVIX) 75 MG tablet Take 1 tablet (75 mg total) by mouth daily. 30 tablet 0  . cyclobenzaprine (FLEXERIL) 10 MG tablet Take 1 tablet by mouth 3 times daily as needed for muscle spasm. Warning: May cause drowsiness. 21 tablet 0  . Dulaglutide (TRULICITY) 1.5 0000000 SOPN Inject 1.5 mg into the skin once a week. 4 pen 11  . fentaNYL (DURAGESIC - DOSED MCG/HR) 75 MCG/HR Place 75 mcg onto the skin every 3 (three) days.     Marland Kitchen gabapentin (NEURONTIN) 600 MG tablet Take 600 mg by mouth 2 (two) times daily.    . hydroxychloroquine (PLAQUENIL) 200 MG tablet Take 200 mg by mouth 2 (two) times daily.     . Insulin Glargine, 2 Unit Dial, (TOUJEO MAX SOLOSTAR) 300 UNIT/ML SOPN Inject 280 Units into the skin every morning. And pen needles 1/day 10 pen 11  . isosorbide mononitrate (IMDUR) 120 MG 24 hr tablet Take 1 tablet by mouth once daily 90 tablet 0  . lamoTRIgine (LAMICTAL) 150 MG tablet Take 2 tablets (300 mg total) by mouth every morning. (Patient taking differently: Take 150 mg by mouth daily. ) 60 tablet 2  . levothyroxine (SYNTHROID, LEVOTHROID) 25 MCG tablet Take 25 mcg by mouth daily before breakfast.     . metFORMIN  (GLUCOPHAGE-XR) 500 MG 24 hr tablet Take 500 mg by mouth 2 (two) times daily.    . metoCLOPramide (REGLAN) 10 MG tablet Take 10 mg by mouth 4 (four) times daily.    . mirtazapine ([name]) 30 MG tablet Take 1 tablet (30 mg total) by mouth at bedtime. 30 tablet 2  . mupirocin ointment (BACTROBAN) 2 % Apply 1 application topically 2 (two) times daily. Apply to the affected area 2 times a day 22 g 3  . omeprazole (PRILOSEC) 20 MG capsule Take 20 mg by mouth every morning.     . ondansetron (ZOFRAN-ODT) 4 MG disintegrating tablet Take 1 tablet (4 mg total) by mouth every 8 (eight) hours as needed for nausea or vomiting. 15 tablet 0  . pregabalin (LYRICA) 150 MG capsule Take 150 mg by mouth 2 (two) times daily.    . Prenatal Vit-Fe Fumarate-FA (PREPLUS) 27-1 MG TABS Take 1 tablet by mouth daily.  3  . rosuvastatin (CRESTOR) 10 MG tablet Take 10 mg by mouth  daily.    . tamsulosin (FLOMAX) 0.4 MG CAPS capsule Take 0.4 mg by mouth daily.    Marland Kitchen testosterone cypionate (DEPOTESTOSTERONE CYPIONATE) 200 MG/ML injection Inject 1 mL into the muscle every 14 (fourteen) days.    Marland Kitchen venlafaxine XR (EFFEXOR-XR) 150 MG 24 hr capsule Take 2 capsules (300 mg total) by mouth daily with breakfast. (Patient taking differently: Take 150 mg by mouth daily with breakfast. ) 60 capsule 2  . fenofibrate 160 MG tablet Take 1 tablet (160 mg total) by mouth daily. (Patient not taking: Reported on 02/21/2019) 30 tablet 0  . metoprolol tartrate (LOPRESSOR) 25 MG tablet Take 1 tablet (25 mg total) by mouth 2 (two) times daily. (Patient not taking: Reported on 02/21/2019) 60 tablet 0   No current facility-administered medications for this visit.    PAST MEDICAL HISTORY: Past Medical History:  Diagnosis Date  . Anxiety   . Bilateral carpal tunnel syndrome   . Bipolar 1 disorder ([place])   . CAD (coronary artery disease)    Non obstructive on CTA Oct 2019.   Marland Kitchen Chronic pain syndrome    back  . Cold extremities    BLE  . COPD  (chronic obstructive pulmonary disease) ([place])   . Cubital tunnel syndrome on right   . DDD (degenerative disc disease), lumbar   . Diabetic peripheral neuropathy ([place])   . Gastroparesis   . GERD (gastroesophageal reflux disease)   . Hiatal hernia   . History of bladder cancer urologist-  dr Consuella Lose   papillay TCC (Ta G1)  s/p TURBT and chemo instillation 2014  . History of bronchitis   . History of carpal tunnel syndrome    Bilateral  . History of chest pain 12/2017  . History of chest pain 12/2017   heart cath normal  . History of encephalopathy 05/27/2015   admission w/ acute encephalopathy thought to be secondary to pain meds and COPD  . History of gastric ulcer   . History of Helicobacter pylori infection   . History of kidney stones    not aware  . History of TIA (transient ischemic attack) 2008    no residual  . History of traumatic head injury 2010   w/ LOC  per pt needed stitches, hit in head with a mower blade  . Hyperlipidemia   . Hypertension   . Hypothyroidism   . Insomnia    per sleep study 04-19-2015 without sleep apnea  . Neuropathy in diabetes ([place])    LOWER EXTREMITIES  . PTSD (post-traumatic stress disorder)   . RA (rheumatoid arthritis) ([place])   . Seizures, transient [facility]) neurologist-  dr Krista Blue--  differential dx complex partial seizure .vs.  mood disorder .vs.  pseudoseizure--  negative EEG's   confusion episodes and staring spells since 11/ 2015  . Sleep apnea    Mild, NO CPAP ORDERED  . Transient confusion NEUOROLOGIST-  DR Stefanos Haynesworth   Episodes since 11/ 2015--  neurologist dx  differential complex partial seizure  .vs. mood disorder . vs. pseudoseizure  . Tremor   . Type 2 diabetes mellitus treated with insulin ([place])     PAST SURGICAL HISTORY: Past Surgical History:  Procedure Laterality Date  . CARDIAC CATHETERIZATION  12-27-2001  DR Einar Gip  &  05-26-2009  DR Irish Lack   RESULTS FOR BOTH ARE NORMAL CORONARIES AND PERSERVED LVF/ EF 60%  . CARPAL TUNNEL  RELEASE Right 09-16-2003  . CARPAL TUNNEL RELEASE Left 02/25/2015   Procedure: LEFT CARPAL TUNNEL RELEASE;  Surgeon:  Leanora Cover, MD;  Location: [facility];  Service: Orthopedics;  Laterality: Left;  . COLONOSCOPY  2014  . CYSTOSCOPY N/A 10/10/2012   Procedure: CYSTOSCOPY CLOT EVACUATION FULGERATION OF BLEEDERS ;  Surgeon: Claybon Jabs, MD;  Location: [facility];  Service: Urology;  Laterality: N/A;  . CYSTOSCOPY WITH BIOPSY N/A 11/26/2015   Procedure: CYSTOSCOPY WITH BIOPSY AND FULGURATION;  Surgeon: Kathie Rhodes, MD;  Location: [place];  Service: Urology;  Laterality: N/A;  . ESOPHAGOGASTRODUODENOSCOPY  2014  . LAPAROSCOPIC CHOLECYSTECTOMY  11-17-2010  . NEGATIVE SLEEP STUDY  04-19-2015  in epic  . ORCHIECTOMY Right 02/21/2016   Procedure: SCROTAL ORCHIECTOMY with TESTICULAR PROSTHESIS IMPLANT;  Surgeon: Kathie Rhodes, MD;  Location[facility];  Service: Urology;  Laterality: Right;  . ORCHIECTOMY Left 09/02/2018   Procedure: ORCHIECTOMY;  Surgeon: Kathie Rhodes, MD;  Location: [facility];  Service: Urology;  Laterality: Left;  . ROTATOR CUFF REPAIR Right 12/2004  . TRANSURETHRAL RESECTION OF BLADDER TUMOR N/A 08/09/2012   Procedure: TRANSURETHRAL RESECTION OF BLADDER TUMOR (TURBT) WITH GYRUS WITH MITOMYCIN C;  Surgeon: Claybon Jabs, MD;  [facility];  Service: Urology;  Laterality: N/A;  . TRANSURETHRAL RESECTION OF BLADDER TUMOR WITH GYRUS (TURBT-GYRUS) N/A 02/27/2014   Procedure: TRANSURETHRAL RESECTION OF BLADDER TUMOR WITH GYRUS (TURBT-GYRUS);  Surgeon: Claybon Jabs, MD;  Location: [facility];  Service: Urology;  Laterality: N/A;    FAMILY HISTORY: Family History  Problem Relation Age of Onset  . Diabetes Mother   . Diabetes Father   . Hypertension Father   . Heart attack Father 45       died age 92  . Alcohol abuse Father     SOCIAL HISTORY: Social  History   Socioeconomic History  . Marital status: Married    Spouse name: Not on file  . Number of children: 3  . Years of education: GED  . Highest education level: Not on file  Occupational History  . Occupation: Engineer, technical sales    Comment: Owner of company  Tobacco Use  . Smoking status: Current Every Day Smoker    Packs/day: 2.00    Years: 38.00    Pack years: 76.00    Types: Cigarettes  . Smokeless tobacco: Never Used  . Tobacco comment: Has cut back to 1 pack a day  Substance and Sexual Activity  . Alcohol use: No  . Drug use: No  . Sexual activity: Not Currently    Partners: Female    Birth control/protection: None  Other Topics Concern  . Not on file  Social History Narrative   Lives at home with his wife and children.   Left-handed.   3-4 cups caffeine per day.   Social Determinants of Health   Financial Resource Strain:   . Difficulty of Paying Living Expenses: Not on file  Food Insecurity:   . Worried About Charity fundraiser in the Last Year: Not on file  . Ran Out of Food in the Last Year: Not on file  Transportation Needs:   . Lack of Transportation (Medical): Not on file  . Lack of Transportation (Non-Medical): Not on file  Physical Activity:   . Days of Exercise per Week: Not on file  . Minutes of Exercise per Session: Not on file  Stress:   . Feeling of Stress : Not on file  Social Connections:   . Frequency of Communication with Friends  and Family: Not on file  . Frequency of Social Gatherings with Friends and Family: Not on file  . Attends Religious Services: Not on file  . Active Member of Clubs or Organizations: Not on file  . Attends Archivist Meetings: Not on file  . Marital Status: Not on file  Intimate Partner Violence:   . Fear of Current or Ex-Partner: Not on file  . Emotionally Abused: Not on file  . Physically Abused: Not on file  . Sexually Abused: Not on file     PHYSICAL EXAM   Vitals:   04/24/19  0828  BP: 135/80  Pulse: 99  Temp: 97.7 F (36.5 C)  Weight: 212 lb 8 oz (96.4 kg)  Height: 6' (1.829 m)    Not recorded      Body mass index is 28.82 kg/m.  PHYSICAL EXAMNIATION:  Gen: NAD, conversant, well nourised, well groomed                     Cardiovascular: Regular rate rhythm, no peripheral edema, warm, nontender. Eyes: Conjunctivae clear without exudates or hemorrhage Neck: Supple, no carotid bruits. Pulmonary: Clear to auscultation bilaterally   NEUROLOGICAL EXAM:  MENTAL STATUS: Speech:    Speech is normal; fluent and spontaneous with normal comprehension.  Cognition:     Orientation to time, place and person     Normal recent and remote memory     Normal Attention span and concentration     Normal Language, naming, repeating,spontaneous speech     Fund of knowledge   CRANIAL NERVES: CN II: Visual fields are full to confrontation. Pupils are round equal and briskly reactive to light. CN III, IV, VI: extraocular movement are normal. No ptosis. CN V: Facial sensation is intact to light touch CN VII: Face is symmetric with normal eye closure  CN VIII: Hearing is normal to causal conversation. CN IX, X: Phonation is normal. CN XI: Head turning and shoulder shrug are intact  MOTOR: Right leg motor examination somewhat limited by his pain, right hip flexion 3 -, right hip abduction 4, adduction 3, knee extension 3, knee flexion 4, ankle dorsiflexion 4, plantarflexion 5 -, bilateral upper extremity and left lower extremity proximal and distal muscle strength is normal   REFLEXES: Reflexes are absent at bilateral upper and lower extremities.. Plantar responses are flexor.  SENSORY: Decreased vibratory sensation at fingertips, decreased light touch, pinprick, vibratory sensations to bilateral knee level  COORDINATION: There is no trunk or limb dysmetria noted.  GAIT/STANCE: He needs push up to get up from seated position, bearing weight with his left leg,  dragging his right leg   DIAGNOSTIC DATA (LABS, IMAGING, TESTING) - I reviewed patient records, labs, notes, testing and imaging myself where available.   ASSESSMENT AND PLAN  Vincent [name] is a 54 y.o. male   Poorly controlled diabetes, most recent A1c was 10.3, insulin-dependent History of moderate diabetic axonal peripheral neuropathy, with subacute onset of right leg pain, weakness,  History and outside EMG nerve conduction study most consistent with diabetic amyotrophy  He is under pain management [name], is receiving polypharmacy treatment already, this including fentanyl patch 75 mcg per 72 hours, gabapentin 600 mg twice a day, Lyrica 150 mg twice a day, also taking [name], Flexeril, Abilify, BuSpar,  Continue physical therapy  Repeat EMG nerve conduction study Nocturnal seizure-like spell  Previously normal EEG, MRI of the brain  Semiology of the seizure are most suggestive of nonepileptic  seizure-like activity, will increase his lamotrigine to 150 mg twice a day for better mood and pain control   Vincent [name], M.D. Ph.D.  [facility] [address], [place], [place] [address] Ph: [phone] Fax: [phone]  CC: Referring Provider

## 2019-04-25 LAB — SEDIMENTATION RATE: Sed Rate: 2 mm/hr (ref 0–30)

## 2019-04-25 LAB — TSH: TSH: 4.49 u[IU]/mL (ref 0.450–4.500)

## 2019-04-25 LAB — C-REACTIVE PROTEIN: CRP: 1 mg/L (ref 0–10)

## 2019-04-25 LAB — HIV ANTIBODY (ROUTINE TESTING W REFLEX): HIV Screen 4th Generation wRfx: NONREACTIVE

## 2019-04-25 LAB — VITAMIN B12: Vitamin B-12: 531 pg/mL (ref 232–1245)

## 2019-04-25 LAB — ANA W/REFLEX IF POSITIVE: Anti Nuclear Antibody (ANA): NEGATIVE

## 2019-04-25 LAB — VITAMIN D 25 HYDROXY (VIT D DEFICIENCY, FRACTURES): Vit D, 25-Hydroxy: 20.4 ng/mL — ABNORMAL LOW (ref 30.0–100.0)

## 2019-04-25 LAB — RPR: RPR Ser Ql: NONREACTIVE

## 2019-04-25 LAB — CK: Total CK: 90 U/L (ref 41–331)

## 2019-04-25 LAB — ANGIOTENSIN CONVERTING ENZYME: Angio Convert Enzyme: 40 U/L (ref 14–82)

## 2019-04-25 LAB — FOLATE: Folate: >20 ng/mL (ref >3.0)

## 2019-04-28 ENCOUNTER — Telehealth: Payer: Self-pay | Admitting: Neurology

## 2019-04-28 NOTE — Telephone Encounter (Signed)
I spoke to the patient's wife who confirmed he is no longer taking the vitamin D listed on his med list. She will have him restart the supplement.

## 2019-04-28 NOTE — Telephone Encounter (Signed)
Tried calling pt, mailbox full. I sent mychart message about results. He is active on mychart.

## 2019-04-28 NOTE — Addendum Note (Signed)
Addended by: Desmond Lope on: 04/28/2019 04:15 PM   Modules accepted: Orders

## 2019-04-28 NOTE — Telephone Encounter (Signed)
Tried calling wife back, mailbox full

## 2019-04-28 NOTE — Telephone Encounter (Signed)
Please call patient, the only abnormality at extensive laboratory evaluation was mildly decreased vitamin D, 20, he should take over-the-counter vitamin D3 supplement 1000 units daily

## 2019-04-28 NOTE — Telephone Encounter (Signed)
Billig,Kimberly W(wife on DPR) has called back and the [employer] from [place], [place] was relayed to her.  Wife is still asking for a call back from [place], [place]

## 2019-04-30 ENCOUNTER — Encounter (HOSPITAL_BASED_OUTPATIENT_CLINIC_OR_DEPARTMENT_OTHER): Payer: No Typology Code available for payment source | Admitting: Physician Assistant

## 2019-04-30 ENCOUNTER — Encounter: Payer: Self-pay | Admitting: Endocrinology

## 2019-04-30 ENCOUNTER — Ambulatory Visit (INDEPENDENT_AMBULATORY_CARE_PROVIDER_SITE_OTHER): Payer: No Typology Code available for payment source | Admitting: Endocrinology

## 2019-04-30 ENCOUNTER — Other Ambulatory Visit: Payer: Self-pay

## 2019-04-30 VITALS — BP 120/72 | HR 100 | Ht 72.0 in | Wt 220.2 lb

## 2019-04-30 DIAGNOSIS — E039 Hypothyroidism, unspecified: Secondary | ICD-10-CM | POA: Diagnosis not present

## 2019-04-30 DIAGNOSIS — L97812 Non-pressure chronic ulcer of other part of right lower leg with fat layer exposed: Secondary | ICD-10-CM | POA: Diagnosis not present

## 2019-04-30 DIAGNOSIS — F17218 Nicotine dependence, cigarettes, with other nicotine-induced disorders: Secondary | ICD-10-CM | POA: Diagnosis not present

## 2019-04-30 DIAGNOSIS — E1142 Type 2 diabetes mellitus with diabetic polyneuropathy: Secondary | ICD-10-CM | POA: Diagnosis not present

## 2019-04-30 DIAGNOSIS — E785 Hyperlipidemia, unspecified: Secondary | ICD-10-CM | POA: Diagnosis not present

## 2019-04-30 DIAGNOSIS — L97219 Non-pressure chronic ulcer of right calf with unspecified severity: Secondary | ICD-10-CM | POA: Diagnosis not present

## 2019-04-30 DIAGNOSIS — E1140 Type 2 diabetes mellitus with diabetic neuropathy, unspecified: Secondary | ICD-10-CM | POA: Diagnosis not present

## 2019-04-30 DIAGNOSIS — E11622 Type 2 diabetes mellitus with other skin ulcer: Secondary | ICD-10-CM | POA: Diagnosis not present

## 2019-04-30 LAB — POCT GLYCOSYLATED HEMOGLOBIN (HGB A1C): Hemoglobin A1C: 7.8 % — AB (ref 4.0–5.6)

## 2019-04-30 MED ORDER — TOUJEO MAX SOLOSTAR 300 UNIT/ML ~~LOC~~ SOPN: 250.0000 [IU] | PEN_INJECTOR | SUBCUTANEOUS | 11 refills | Status: DC

## 2019-04-30 MED ORDER — TRULICITY 3 MG/0.5ML ~~LOC~~ SOAJ: 3.0000 mg | SUBCUTANEOUS | 11 refills | Status: DC

## 2019-04-30 NOTE — Patient Instructions (Addendum)
check your blood sugar twice a day.  vary the time of day when you check, between before the 3 meals, and at bedtime.  also check if you have symptoms of your blood sugar being too high or too low.  please keep a record of the readings and bring it to your next appointment here (or you can bring the meter itself).  You can write it on any piece of paper.  please call us sooner if your blood sugar goes below 70, or if you have a lot of readings over 200. For now, please: double the Trulicity again, and: Reduce the Toujeo to 250 units each morning.  Please come back for a follow-up appointment in 1 month.

## 2019-04-30 NOTE — Progress Notes (Signed)
Subjective:    Patient ID: Vincent [name], male    DOB: 1965/10/01, 54 y.o.   MRN: [id number]  HPI Pt returns for f/u of diabetes mellitus:  DM type: Insulin-requiring type 2 Dx'ed: [id number] Complications: PN, TIA, CAD, and AN.   Therapy: insulin since 2005, metformin, and Trulicity.   DKA: never Severe hypoglycemia: never Pancreatitis: never Pancreatic imaging: normal on 2013 CT SDOH: none Other: he has intermittent steroid injections into the lower back; he stopped Trulicity, due to lack of effect.   Interval history: no cbg record, but states cbg's vary from 49-210.  It is in general lowest fasting.  pt states he feels well in general.  No recent steroids.  Pt says he wishes to decline pump. Past Medical History:  Diagnosis Date  . Anxiety   . Bilateral carpal tunnel syndrome   . Bipolar 1 disorder ([place])   . CAD (coronary artery disease)    Non obstructive on CTA Oct 2019.   Marland Kitchen Chronic pain syndrome    back  . Cold extremities    BLE  . COPD (chronic obstructive pulmonary disease) ([place])   . Cubital tunnel syndrome on right   . DDD (degenerative disc disease), lumbar   . Diabetic peripheral neuropathy ([place])   . Gastroparesis   . GERD (gastroesophageal reflux disease)   . Hiatal hernia   . History of bladder cancer urologist-  dr Consuella Lose   papillay TCC (Ta G1)  s/p TURBT and chemo instillation 2014  . History of bronchitis   . History of carpal tunnel syndrome    Bilateral  . History of chest pain 12/2017  . History of chest pain 12/2017   heart cath normal  . History of encephalopathy 05/27/2015   admission w/ acute encephalopathy thought to be secondary to pain meds and COPD  . History of gastric ulcer   . History of Helicobacter pylori infection   . History of kidney stones    not aware  . History of TIA (transient ischemic attack) 2008    no residual  . History of traumatic head injury 2010   w/ LOC  per pt needed stitches, hit in head with a mower blade  .  Hyperlipidemia   . Hypertension   . Hypothyroidism   . Insomnia    per sleep study 04-19-2015 without sleep apnea  . Neuropathy in diabetes ([place])    LOWER EXTREMITIES  . PTSD (post-traumatic stress disorder)   . RA (rheumatoid arthritis) ([place])   . Seizures, transient [facility]) neurologist-  dr Krista Blue--  differential dx complex partial seizure .vs.  mood disorder .vs.  pseudoseizure--  negative EEG's   confusion episodes and staring spells since 11/ 2015  . Sleep apnea    Mild, NO CPAP ORDERED  . Transient confusion NEUOROLOGIST-  DR YAN   Episodes since 11/ 2015--  neurologist dx  differential complex partial seizure  .vs. mood disorder . vs. pseudoseizure  . Tremor   . Type 2 diabetes mellitus treated with insulin [facility])     Past Surgical History:  Procedure Laterality Date  . CARDIAC CATHETERIZATION  12-27-2001  DR Einar Gip  &  05-26-2009  DR Irish Lack   RESULTS FOR BOTH ARE NORMAL CORONARIES AND PERSERVED LVF/ EF 60%  . CARPAL TUNNEL RELEASE Right 09-16-2003  . CARPAL TUNNEL RELEASE Left 02/25/2015   Procedure: LEFT CARPAL TUNNEL RELEASE;  Surgeon: Leanora Cover, MD;  Location: [place];  Service: Orthopedics;  Laterality: Left;  . COLONOSCOPY  2014  . CYSTOSCOPY N/A 10/10/2012   Procedure: CYSTOSCOPY CLOT EVACUATION FULGERATION OF BLEEDERS ;  Surgeon: Claybon Jabs, MD;  Location: [facility];  Service: Urology;  Laterality: N/A;  . CYSTOSCOPY WITH BIOPSY N/A 11/26/2015   Procedure: CYSTOSCOPY WITH BIOPSY AND FULGURATION;  Surgeon: Kathie Rhodes, MD;  Location: [place];  Service: Urology;  Laterality: N/A;  . ESOPHAGOGASTRODUODENOSCOPY  2014  . LAPAROSCOPIC CHOLECYSTECTOMY  11-17-2010  . NEGATIVE SLEEP STUDY  04-19-2015  in epic  . ORCHIECTOMY Right 02/21/2016   Procedure: SCROTAL ORCHIECTOMY with TESTICULAR PROSTHESIS IMPLANT;  Surgeon: Kathie Rhodes, MD;  Location: [facility];  Service: Urology;  Laterality: Right;  .  ORCHIECTOMY Left 09/02/2018   Procedure: ORCHIECTOMY;  Surgeon: Kathie Rhodes, MD;  Location[facility];  Service: Urology;  Laterality: Left;  . ROTATOR CUFF REPAIR Right 12/2004  . TRANSURETHRAL RESECTION OF BLADDER TUMOR N/A 08/09/2012   Procedure: TRANSURETHRAL RESECTION OF BLADDER TUMOR (TURBT) WITH GYRUS WITH MITOMYCIN C;  Surgeon: Claybon Jabs, MD;  Location: [facility];  Service: Urology;  Laterality: N/A;  . TRANSURETHRAL RESECTION OF BLADDER TUMOR WITH GYRUS (TURBT-GYRUS) N/A 02/27/2014   Procedure: TRANSURETHRAL RESECTION OF BLADDER TUMOR WITH GYRUS (TURBT-GYRUS);  Surgeon: Claybon Jabs, MD;  Location: [facility];  Service: Urology;  Laterality: N/A;    Social History   Socioeconomic History  . Marital status: Married    Spouse name: Not on file  . Number of children: 3  . Years of education: GED  . Highest education level: Not on file  Occupational History  . Occupation: Engineer, technical sales    Comment: Owner of company  Tobacco Use  . Smoking status: Current Every Day Smoker    Packs/day: 2.00    Years: 38.00    Pack years: 76.00    Types: Cigarettes  . Smokeless tobacco: Never Used  . Tobacco comment: Has cut back to 1 pack a day  Substance and Sexual Activity  . Alcohol use: No  . Drug use: No  . Sexual activity: Not Currently    Partners: Female    Birth control/protection: None  Other Topics Concern  . Not on file  Social History Narrative   Lives at home with his wife and children.   Left-handed.   3-4 cups caffeine per day.   Social Determinants of Health   Financial Resource Strain:   . Difficulty of Paying Living Expenses: Not on file  Food Insecurity:   . Worried About Charity fundraiser in the Last Year: Not on file  . Ran Out of Food in the Last Year: Not on file  Transportation Needs:   . Lack of Transportation (Medical): Not on file  . Lack of Transportation (Non-Medical): Not on  file  Physical Activity:   . Days of Exercise per Week: Not on file  . Minutes of Exercise per Session: Not on file  Stress:   . Feeling of Stress : Not on file  Social Connections:   . Frequency of Communication with Friends and Family: Not on file  . Frequency of Social Gatherings with Friends and Family: Not on file  . Attends Religious Services: Not on file  . Active Member of Clubs or Organizations: Not on file  . Attends Archivist Meetings: Not on file  . Marital Status: Not on file  Intimate Partner Violence:   . Fear of Current or Ex-Partner: Not on file  .  Emotionally Abused: Not on file  . Physically Abused: Not on file  . Sexually Abused: Not on file    Current Outpatient Medications on File Prior to Visit  Medication Sig Dispense Refill  . albuterol (VENTOLIN HFA) 108 (90 Base) MCG/ACT inhaler Inhale 1-2 puffs into the lungs every 6 (six) hours as needed for wheezing or shortness of breath. 18 g 0  . amLODipine (NORVASC) 5 MG tablet Take 5 mg by mouth daily.    . ARIPiprazole (ABILIFY) 5 MG tablet Take 1 tablet (5 mg total) by mouth daily. 30 tablet 2  . atorvastatin (LIPITOR) 40 MG tablet Take 1 tablet (40 mg total) by mouth daily at 6 PM. 30 tablet 1  . busPIRone (BUSPAR) 5 MG tablet Take 1 tablet (5 mg total) by mouth 2 (two) times daily. 60 tablet 2  . Cholecalciferol (VITAMIN D3 PO) Take 1,000 Units by mouth daily.    . clopidogrel (PLAVIX) 75 MG tablet Take 1 tablet (75 mg total) by mouth daily. 30 tablet 0  . cyclobenzaprine (FLEXERIL) 10 MG tablet Take 1 tablet by mouth 3 times daily as needed for muscle spasm. Warning: May cause drowsiness. 21 tablet 0  . fentaNYL (DURAGESIC - DOSED MCG/HR) 75 MCG/HR Place 75 mcg onto the skin every 3 (three) days.     Marland Kitchen gabapentin (NEURONTIN) 600 MG tablet Take 600 mg by mouth 2 (two) times daily.    . hydroxychloroquine (PLAQUENIL) 200 MG tablet Take 200 mg by mouth 2 (two) times daily.     . isosorbide mononitrate  (IMDUR) 120 MG 24 hr tablet Take 1 tablet by mouth once daily 90 tablet 0  . lamoTRIgine (LAMICTAL) 150 MG tablet Take 1 tablet (150 mg total) by mouth 2 (two) times daily. 60 tablet 11  . levothyroxine (SYNTHROID, LEVOTHROID) 25 MCG tablet Take 25 mcg by mouth daily before breakfast.     . metFORMIN (GLUCOPHAGE-XR) 500 MG 24 hr tablet Take 500 mg by mouth 2 (two) times daily.    . metoCLOPramide (REGLAN) 10 MG tablet Take 10 mg by mouth 4 (four) times daily.    . mirtazapine ([name]) 30 MG tablet Take 1 tablet (30 mg total) by mouth at bedtime. 30 tablet 2  . mupirocin ointment (BACTROBAN) 2 % Apply 1 application topically 2 (two) times daily. Apply to the affected area 2 times a day 22 g 3  . omeprazole (PRILOSEC) 20 MG capsule Take 20 mg by mouth every morning.     . ondansetron (ZOFRAN-ODT) 4 MG disintegrating tablet Take 1 tablet (4 mg total) by mouth every 8 (eight) hours as needed for nausea or vomiting. 15 tablet 0  . pregabalin (LYRICA) 150 MG capsule Take 150 mg by mouth 2 (two) times daily.    . Prenatal Vit-Fe Fumarate-FA (PREPLUS) 27-1 MG TABS Take 1 tablet by mouth daily.  3  . rosuvastatin (CRESTOR) 10 MG tablet Take 10 mg by mouth daily.    . tamsulosin (FLOMAX) 0.4 MG CAPS capsule Take 0.4 mg by mouth daily.    Marland Kitchen testosterone cypionate (DEPOTESTOSTERONE CYPIONATE) 200 MG/ML injection Inject 1 mL into the muscle every 14 (fourteen) days.    Marland Kitchen venlafaxine XR (EFFEXOR-XR) 150 MG 24 hr capsule Take 2 capsules (300 mg total) by mouth daily with breakfast. (Patient taking differently: Take 150 mg by mouth daily with breakfast. ) 60 capsule 2  . fenofibrate 160 MG tablet Take 1 tablet (160 mg total) by mouth daily. (Patient not taking: Reported on 02/21/2019)  30 tablet 0  . metoprolol tartrate (LOPRESSOR) 25 MG tablet Take 1 tablet (25 mg total) by mouth 2 (two) times daily. (Patient not taking: Reported on 02/21/2019) 60 tablet 0   No current facility-administered medications on file  prior to visit.    Allergies  Allergen Reactions  . Celebrex [Celecoxib] Anaphylaxis and Rash  . Hydrocodone Rash and Other (See Comments)    "blisters developed on arms"  . Sulfa Antibiotics Rash    Family History  Problem Relation Age of Onset  . Diabetes Mother   . Diabetes Father   . Hypertension Father   . Heart attack Father 34       died age 29  . Alcohol abuse Father     BP 120/72 (BP Location: Left Arm, Patient Position: Sitting, Cuff Size: Large)   Pulse 100   Ht 6' (1.829 m)   Wt 220 lb 3.2 oz (99.9 kg)   SpO2 96%   BMI 29.86 kg/m    Review of Systems Denies LOC.      Objective:   Physical Exam VITAL SIGNS:  See vs page GENERAL: no distress Pulses: dorsalis pedis intact bilat.   MSK: no deformity of the feet CV: no leg edema Skin:  no ulcer on the feet.  normal color and temp on the feet. Neuro: sensation is intact to touch on the feet Ext: there is bilateral onychomycosis of the toenails.    Lab Results  Component Value Date   HGBA1C 7.8 (A) 04/30/2019   Lab Results  Component Value Date   CREATININE 0.88 12/10/2018   BUN 19 12/10/2018   NA 139 12/10/2018   K 4.2 12/10/2018   CL 106 12/10/2018   CO2 22 12/10/2018        Assessment & Plan:  Insulin-requiring type 2 DM, with CAD: this is the best control this pt should aim for, given this regimen, which does match insulin to her changing needs throughout the day Hypoglycemia: this limits aggressiveness of glycemic control Obesity: we'll decrease insulin in favor of Trulicity.  Patient Instructions  check your blood sugar twice a day.  vary the time of day when you check, between before the 3 meals, and at bedtime.  also check if you have symptoms of your blood sugar being too high or too low.  please keep a record of the readings and bring it to your next appointment here (or you can bring the meter itself).  You can write it on any piece of paper.  please call us sooner if your blood sugar  goes below 70, or if you have a lot of readings over 200. For now, please: double the Trulicity again, and: Reduce the [place] to 250 units each morning.  Please come back for a follow-up appointment in 1 month.

## 2019-04-30 NOTE — Progress Notes (Addendum)
Vincent [name], Vincent [name] ([id number]) Visit Report for 04/30/2019 Chief Complaint Document Details Patient Name: Date of Service: Vincent [name], Vincent [name]. 04/30/2019 9:45 AM Medical Record [id number] Patient Account Number: [id number] Date of Birth/Sex: Treating RN: 1966-02-17 (54 y.o. Ernestene Mention Primary Care Provider: Hayden Rasmussen Other Clinician: Referring Provider: Treating Provider/Extender:Stone III, Ileene Musa, Penelope Galas in Treatment: 2 Information Obtained from: Patient Chief Complaint Right LE Ulcer Electronic Signature(s) Signed: 04/30/2019 10:19:49 AM By: Worthy Keeler PA-C Entered By: Worthy Keeler on 04/30/2019 10:19:48 -------------------------------------------------------------------------------- HPI Details Patient Name: Date of Service: Vincent [name], Vincent [name]. 04/30/2019 9:45 AM Medical Record [id number] Patient Account Number: [id number] Date of Birth/Sex: Treating RN: 27-Dec-1965 (54 y.o. Ernestene Mention Primary Care Provider: Hayden Rasmussen Other Clinician: Referring Provider: Treating Provider/Extender:Stone III, Ileene Musa, Penelope Galas in Treatment: 2 History of Present Illness HPI Description: 04/16/2019 upon evaluation today patient presents for initial inspection here in our clinic concerning wounds he has had present for the past 2 and half months. Currently he has been attempting to try different various treatments as recommended by his primary care provider. Unfortunately they have not been able to get things moving along as far as getting specific dressings together. Currently he has been using Neosporin since that was the one thing that they were able to work out at home. He does have home health although the only came out once and they stated they needed updated orders from today before they could do anything else. His primary care provider did attempt Santyl but unfortunately was having trouble with that then wrote a prescription for  Prisma but again the patient was not able to get that filled as its not really a prescription of just a matter of getting insurance to cover it again they did not know the method by which to order that it seems. Either way that is where home health came into play and right now they are somewhat stalled as well. The patient has been on doxycycline which he took for a week he was also given 2 shots of Rocephin. He is a diabetic with a hemoglobin A1c of 10.3. He also is a current 2 pack/day smoker. He does have a history significant for diabetes mellitus type 2, COPD, seizures, hypertension, and nicotine dependence. Currently the patient tells me that overall he has been trying to manage this on his own with his wife's help and I do not believe they have been doing a poor job at all but again I do believe we may be able to do some things to help him out. He also tells me he is attempted to quit smoking in the past he just had a very hard time of it. He is even tried over-the-counter remedies as well as prescription in the past. He did not tell me specifically what. 04/23/2019 upon evaluation today patient appears to be doing excellent in regard to his wound in fact this has healed tremendously since I last saw him last week. He has been tolerating the dressing changes without complication. In fact the wound is significantly smaller compared to what it was when I last saw him which was the initial visit here in the clinic. 04/30/2019 on evaluation today patient appears to be doing excellent in regard to his wound. In fact this appears to be completely healed and has done extremely well I am very pleased in this regard. He is having no signs of any pain. Electronic Signature(s) Signed: 04/30/2019 11:15:17 AM By:  Melburn Hake, Helix Lafontaine PA-C Entered By: Worthy Keeler on 04/30/2019 11:15:17 -------------------------------------------------------------------------------- Physical Exam Details Patient  Name: Date of Service: Vincent [name], Vincent [name]. 04/30/2019 9:45 AM Medical Record [place]:[id number] Patient Account Number: [id number] Date of Birth/Sex: Treating RN: 03-Sep-1965 (54 y.o. Ernestene Mention Primary Care Provider: Hayden Rasmussen Other Clinician: Referring Provider: Treating Provider/Extender:Stone III, Ileene Musa, Penelope Galas in Treatment: 2 Constitutional Well-nourished and well-hydrated in no acute distress. Respiratory normal breathing without difficulty. Psychiatric this patient is able to make decisions and demonstrates good insight into disease process. Alert and Oriented x 3. pleasant and cooperative. Notes Patient's wound showed complete epithelization there does not appear to be any evidence of active infection and overall I think he has healed not only well but also extremely quickly which is great news. I am very pleased at this point. Electronic Signature(s) Signed: 04/30/2019 11:15:45 AM By: Worthy Keeler PA-C Entered By: Worthy Keeler on 04/30/2019 11:15:45 -------------------------------------------------------------------------------- Physician Orders Details Patient Name: Date of Service: Vincent [name], Vincent [name]. 04/30/2019 9:45 AM Medical Record [place]:[id number] Patient Account Number: [id number] Date of Birth/Sex: Treating RN: 1965/09/26 (54 y.o. Ulyses Amor, Vaughan Basta Primary Care Provider: Hayden Rasmussen Other Clinician: Referring Provider: Treating Provider/Extender:Stone III, Ileene Musa, Penelope Galas in Treatment: 2 Verbal / Phone Orders: No Diagnosis Coding ICD-10 Coding Code Description E11.622 Type 2 diabetes mellitus with other skin ulcer L97.812 Non-pressure chronic ulcer of other part of right lower leg with fat layer exposed J44.9 Chronic obstructive pulmonary disease, unspecified G40.89 Other seizures I10 Essential (primary) hypertension F17.218 Nicotine dependence, cigarettes, with other nicotine-induced disorders Discharge From [facility]  Services Discharge from [place] discontinue wound care Electronic Signature(s) Signed: 05/01/2019 12:37:21 PM By: Worthy Keeler PA-C Signed: 05/02/2019 6:14:01 PM By: Baruch Gouty RN, BSN Entered By: Baruch Gouty on 04/30/2019 11:13:37 -------------------------------------------------------------------------------- Problem List Details Patient Name: Date of Service: Vincent [name], Vincent [name]. 04/30/2019 9:45 AM Medical Record [place]:[id number] Patient Account Number: [id number] Date of Birth/Sex: Treating RN: 07/29/1965 (54 y.o. Ernestene Mention Primary Care Provider: Hayden Rasmussen Other Clinician: Referring Provider: Treating Provider/Extender:Stone III, Ileene Musa, Penelope Galas in Treatment: 2 Active Problems ICD-10 Evaluated Encounter Code Description Active Date Today Diagnosis E11.622 Type 2 diabetes mellitus with other skin ulcer 04/16/2019 No Yes L97.812 Non-pressure chronic ulcer of other part of right lower 04/16/2019 No Yes leg with fat layer exposed J44.9 Chronic obstructive pulmonary disease, unspecified 04/16/2019 No Yes G40.89 Other seizures 04/16/2019 No Yes I10 Essential (primary) hypertension 04/16/2019 No Yes F17.218 Nicotine dependence, cigarettes, with other nicotine- 04/16/2019 No Yes induced disorders Inactive Problems Resolved Problems Electronic Signature(s) Signed: 04/30/2019 10:19:39 AM By: Worthy Keeler PA-C Entered By: Worthy Keeler on 04/30/2019 10:19:39 -------------------------------------------------------------------------------- Progress Note Details Patient Name: Date of Service: Vincent [name], Vincent [name]. 04/30/2019 9:45 AM Medical Record [place]:[id number] Patient Account Number: [id number] Date of Birth/Sex: Treating RN: 06-Apr-1965 (54 y.o. Ernestene Mention Primary Care Provider: Hayden Rasmussen Other Clinician: Referring Provider: Treating Provider/Extender:Stone III, Ileene Musa, Penelope Galas in Treatment: 2 Subjective Chief Complaint Information obtained from Patient Right LE Ulcer History of Present Illness (HPI) 04/16/2019 upon evaluation today patient presents for initial inspection here in our clinic concerning wounds he has had present for the past 2 and half months. Currently he has been attempting to try different various treatments as recommended by his primary care provider. Unfortunately they have not been able to get things moving along as far as  getting specific dressings together. Currently he has been using Neosporin since that was the one thing that they were able to work out at home. He does have home health although the only came out once and they stated they needed updated orders from today before they could do anything else. His primary care provider did attempt Santyl but unfortunately was having trouble with that then wrote a prescription for Prisma but again the patient was not able to get that filled as its not really a prescription of just a matter of getting insurance to cover it again they did not know the method by which to order that it seems. Either way that is where home health came into play and right now they are somewhat stalled as well. The patient has been on doxycycline which he took for a week he was also given 2 shots of Rocephin. He is a diabetic with a hemoglobin A1c of 10.3. He also is a current 2 pack/day smoker. He does have a history significant for diabetes mellitus type 2, COPD, seizures, hypertension, and nicotine dependence. Currently the patient tells me that overall he has been trying to manage this on his own with his wife's help and I do not believe they have been doing a poor job at all but again I do believe we may be able to do some things to help him out. He also tells me he is attempted to quit smoking in the past he just had a very hard time of it. He is even tried over-the-counter remedies as well as prescription in  the past. He did not tell me specifically what. 04/23/2019 upon evaluation today patient appears to be doing excellent in regard to his wound in fact this has healed tremendously since I last saw him last week. He has been tolerating the dressing changes without complication. In fact the wound is significantly smaller compared to what it was when I last saw him which was the initial visit here in the clinic. 04/30/2019 on evaluation today patient appears to be doing excellent in regard to his wound. In fact this appears to be completely healed and has done extremely well I am very pleased in this regard. He is having no signs of any pain. Objective Constitutional Well-nourished and well-hydrated in no acute distress. Vitals Time Taken: 10:22 AM, Height: 72 in, Weight: 217 lbs, BMI: 29.4, Temperature: 98.1 F, Pulse: 96 bpm, Respiratory Rate: 18 breaths/min, Blood Pressure: 151/68 mmHg, Capillary Blood Glucose: 128 mg/dl. Respiratory normal breathing without difficulty. Psychiatric this patient is able to make decisions and demonstrates good insight into disease process. Alert and Oriented x 3. pleasant and cooperative. General Notes: Patient's wound showed complete epithelization there does not appear to be any evidence of active infection and overall I think he has healed not only well but also extremely quickly which is great news. I am very pleased at this point. Integumentary (Hair, Skin) Wound #1 status is Open. Original cause of wound was Trauma. The wound is located on the Right,Posterior Lower Leg. The wound measures 0cm length x 0cm width x 0cm depth; 0cm^2 area and 0cm^3 volume. Assessment Active Problems ICD-10 Type 2 diabetes mellitus with other skin ulcer Non-pressure chronic ulcer of other part of right lower leg with fat layer exposed Chronic obstructive pulmonary disease, unspecified Other seizures Essential (primary) hypertension Nicotine dependence, cigarettes, with  other nicotine-induced disorders Plan Discharge From [facility] Services: Discharge from [place]: [place] discontinue  wound care 1. My suggestion at this time is can be that we discontinue wound care measures at this point and the patient is in agreement with the plan. 2. I am also going to suggest that we have him only cover the area if he needs to as far as working or doing something that he thinks he is potentially going to hit the area on something that would be a good idea otherwise I think he can just leave this open to air and shower/bathe as normal. We will see the patient back for follow-up visit as needed. Electronic Signature(s) Signed: 04/30/2019 11:16:50 AM By: Worthy Keeler PA-C Entered By: Worthy Keeler on 04/30/2019 11:16:50 -------------------------------------------------------------------------------- SuperBill Details Patient Name: Date of Service: Vincent [name], Vincent [name] 04/30/2019 Medical Record [place]:[id number] Patient Account Number: [id number] Date of Birth/Sex: Treating RN: [birth year] (54 y.o. Ernestene Mention Primary Care Provider: Hayden Rasmussen Other Clinician: Referring Provider: Treating Provider/Extender:Stone III, Ileene Musa, Penelope Galas in Treatment: 2 Diagnosis Coding ICD-10 Codes Code Description E11.622 Type 2 diabetes mellitus with other skin ulcer L97.812 Non-pressure chronic ulcer of other part of right lower leg with fat layer exposed J44.9 Chronic obstructive pulmonary disease, unspecified G40.89 Other seizures I10 Essential (primary) hypertension F17.218 Nicotine dependence, cigarettes, with other nicotine-induced disorders Facility Procedures CPT4 Code: [id number] Description: 99213 - WOUND CARE VISIT-LEV 3 EST PT Modifier: Quantity: 1 Physician Procedures CPT4 Code Description: [id number] 99213 - WC PHYS LEVEL 3 - EST PT ICD-10 Diagnosis Description E11.622 Type 2 diabetes mellitus with other  skin ulcer L97.812 Non-pressure chronic ulcer of other part of right lower J44.9 Chronic obstructive pulmonary  disease, unspecified G40.89 Other seizures Modifier: leg with fat la Quantity: 1 yer exposed Electronic Signature(s) Signed: 04/30/2019 11:17:04 AM By: Worthy Keeler PA-C Entered By: Worthy Keeler on 04/30/2019 11:17:04

## 2019-05-03 ENCOUNTER — Other Ambulatory Visit (HOSPITAL_COMMUNITY): Payer: Self-pay | Admitting: Psychiatry

## 2019-05-03 ENCOUNTER — Other Ambulatory Visit: Payer: Self-pay | Admitting: Cardiology

## 2019-05-03 DIAGNOSIS — F4312 Post-traumatic stress disorder, chronic: Secondary | ICD-10-CM

## 2019-05-06 ENCOUNTER — Other Ambulatory Visit: Payer: Self-pay

## 2019-05-06 ENCOUNTER — Ambulatory Visit (INDEPENDENT_AMBULATORY_CARE_PROVIDER_SITE_OTHER): Payer: No Typology Code available for payment source | Admitting: Psychiatry

## 2019-05-06 ENCOUNTER — Encounter (HOSPITAL_COMMUNITY): Payer: Self-pay | Admitting: Psychiatry

## 2019-05-06 DIAGNOSIS — F4312 Post-traumatic stress disorder, chronic: Secondary | ICD-10-CM | POA: Diagnosis not present

## 2019-05-06 DIAGNOSIS — F3181 Bipolar II disorder: Secondary | ICD-10-CM | POA: Diagnosis not present

## 2019-05-06 DIAGNOSIS — Z7902 Long term (current) use of antithrombotics/antiplatelets: Secondary | ICD-10-CM

## 2019-05-06 DIAGNOSIS — F17210 Nicotine dependence, cigarettes, uncomplicated: Secondary | ICD-10-CM

## 2019-05-06 MED ORDER — MIRTAZAPINE 30 MG PO TABS: 30.0000 mg | ORAL_TABLET | Freq: Every day | ORAL | 2 refills | Status: DC

## 2019-05-06 MED ORDER — VENLAFAXINE HCL ER 150 MG PO CP24: 150.0000 mg | ORAL_CAPSULE | Freq: Every day | ORAL | 2 refills | Status: DC

## 2019-05-06 MED ORDER — ARIPIPRAZOLE 5 MG PO TABS: 5.0000 mg | ORAL_TABLET | Freq: Every day | ORAL | 2 refills | Status: DC

## 2019-05-06 NOTE — Progress Notes (Signed)
Virtual Visit via Telephone Note  I connected with Vincent [name] on 05/06/19 at 10:00 AM EST by telephone and verified that I am speaking with the correct person using two identifiers.   I discussed the limitations, risks, security and privacy concerns of performing an evaluation and management service by telephone and the availability of in person appointments. I also discussed with the patient that there may be a patient responsible charge related to this service. The patient expressed understanding and agreed to proceed.   History of Present Illness: Patient was evaluated by phone session.  He is feeling better with the medication.  He is no longer taking BuSpar and he had cut down his Effexor to only current 150 in the morning.  Recently he seen his physician including neurology and PCP.  He had blood work and his hemoglobin A1c dropped from 10.3-7.8.  However he still have a lot of numbness, tingling and difficulty walking.  But he reported his mood is much stable and he denies any highs or lows, anger, irritability or any crying spells.  He is compliant with Abilify, [name] and recently Lamictal was refilled by his neurologist.  He does not feel that he need to see therapist since things are going well.  He is sleeping better and sometimes he has nightmares but they are not as bad and intense.  He had a good support from his family.  Past Psychiatric History:Reviewed. H/Oone brief hospitalization at [facility] due to suicidal thoughts. No h/osuicidal attempt. H/[place]anger, anxiety, PTSD and heavy substance use. 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 Tried Depakote, Zoloft, lithium, Prozac and Xanax.  Recent Results (from the past 2160 hour(s))  POCT HgB A1C     Status: Abnormal   Collection Time: 02/21/19  9:37 AM  Result Value Ref Range   Hemoglobin A1C 10.3 (A) 4.0 - 5.6 %   HbA1c POC (<> result, manual entry)     HbA1c, POC (prediabetic range)     HbA1c, POC (controlled  diabetic range)    RPR     Status: None   Collection Time: 04/24/19  9:34 AM  Result Value Ref Range   RPR Ser Ql Non Reactive Non Reactive  Vitamin B12     Status: None   Collection Time: 04/24/19  9:34 AM  Result Value Ref Range   Vitamin B-12 531 232 - 1,245 pg/mL  HIV Antibody (routine testing w rflx)     Status: None   Collection Time: 04/24/19  9:34 AM  Result Value Ref Range   HIV Screen 4th Generation wRfx Non Reactive Non Reactive  Folate     Status: None   Collection Time: 04/24/19  9:34 AM  Result Value Ref Range   Folate >20.0 >3.0 ng/mL    Comment: A serum folate concentration of less than 3.1 ng/mL is considered to represent clinical deficiency.   C-reactive protein     Status: None   Collection Time: 04/24/19  9:34 AM  Result Value Ref Range   CRP 1 0 - 10 mg/L  TSH     Status: None   Collection Time: 04/24/19  9:34 AM  Result Value Ref Range   TSH 4.490 0.450 - 4.500 uIU/mL  CK     Status: None   Collection Time: 04/24/19  9:34 AM  Result Value Ref Range   Total CK 90 41 - 331 U/L  Sedimentation rate     Status: None   Collection Time: 04/24/19  9:34 AM  Result Value Ref Range   Sed Rate 2 0 - 30 mm/hr  ANA w/Reflex if Positive     Status: None   Collection Time: 04/24/19  9:34 AM  Result Value Ref Range   Anti Nuclear Antibody (ANA) Negative Negative  Angiotensin converting enzyme     Status: None   Collection Time: 04/24/19  9:34 AM  Result Value Ref Range   Angio Convert Enzyme 40 14 - 82 U/L  VITAMIN D 25 Hydroxy (Vit-D Deficiency, Fractures)     Status: Abnormal   Collection Time: 04/24/19  9:34 AM  Result Value Ref Range   Vit D, 25-Hydroxy 20.4 (L) 30.0 - 100.0 ng/mL    Comment: Vitamin D deficiency has been defined by the [place] and an Endocrine Society practice guideline as a level of serum 25-OH vitamin D less than 20 ng/mL (1,2). The Endocrine Society went on to further define vitamin D insufficiency as a level between 21  and 29 ng/mL (2). 1. IOM (Institute of Medicine). 2010. Dietary reference    intakes for calcium and D. Rosa Sanchez: The    [employer]. 2. Holick MF, Binkley [place], Bischoff-Ferrari HA, et al.    Evaluation, treatment, and prevention of vitamin D    deficiency: an Endocrine Society clinical practice    guideline. JCEM. 2011 Jul; 96(7):1911-30.   POCT HgB A1C     Status: Abnormal   Collection Time: 04/30/19  9:28 AM  Result Value Ref Range   Hemoglobin A1C 7.8 (A) 4.0 - 5.6 %   HbA1c POC (<> result, manual entry)     HbA1c, POC (prediabetic range)     HbA1c, POC (controlled diabetic range)       Psychiatric Specialty Exam: Physical Exam  Review of Systems  There were no vitals taken for this visit.There is no height or weight on file to calculate BMI.  General Appearance: NA  Eye Contact:  NA  Speech:  Clear and Coherent and Slow  Volume:  Decreased  Mood:  Euthymic  Affect:  NA  Thought Process:  Descriptions of Associations: Intact  Orientation:  Full (Time, Place, and Person)  Thought Content:  Logical  Suicidal Thoughts:  No  Homicidal Thoughts:  No  Memory:  Immediate;   Fair Recent;   Fair Remote;   Fair  Judgement:  Intact  Insight:  Present  Psychomotor Activity:  NA  Concentration:  Concentration: Fair and Attention Span: Fair  Recall:  [employer] of Knowledge:  Good  Language:  Good  Akathisia:  No  Handed:  Right  AIMS (if indicated):     Assets:  Communication Skills Desire for Improvement Housing Resilience Social Support  ADL's:  Intact  Cognition:  WNL  Sleep:   ok      Assessment and Plan: PTSD.  Bipolar disorder type II.  I reviewed his blood work results.  His hemoglobin A1c is improved.  He is not interested in therapy since things are going well.  Continue Abilify 5 mg daily, [name] 30 mg at bedtime and Effexor XR 150 mg in the morning.  He had cut down his Effexor dose from 300-150.  He is getting lamotrigine 300 mg from  neurology and he has enough refills.  Recommended to call us back if is any question or any concern.  Follow-up in 3 months.  Follow Up Instructions:    I discussed the assessment and treatment plan with the patient. The patient was  provided an opportunity to ask questions and all were answered. The patient agreed with the plan and demonstrated an understanding of the instructions.   The patient was advised to call back or seek an in-person evaluation if the symptoms worsen or if the condition fails to improve as anticipated.  I provided 20 minutes of non-face-to-face time during this encounter.   Kathlee Nations, MD

## 2019-05-07 ENCOUNTER — Encounter: Payer: Medicare Other | Admitting: Neurology

## 2019-05-12 DIAGNOSIS — E1151 Type 2 diabetes mellitus with diabetic peripheral angiopathy without gangrene: Secondary | ICD-10-CM | POA: Diagnosis not present

## 2019-05-16 NOTE — Telephone Encounter (Signed)
Error

## 2019-05-27 ENCOUNTER — Encounter: Payer: Self-pay | Admitting: Physician Assistant

## 2019-05-28 ENCOUNTER — Ambulatory Visit: Payer: Medicare Other | Admitting: Endocrinology

## 2019-05-29 ENCOUNTER — Other Ambulatory Visit: Payer: Self-pay

## 2019-06-02 ENCOUNTER — Encounter: Payer: Self-pay | Admitting: Endocrinology

## 2019-06-02 ENCOUNTER — Ambulatory Visit (INDEPENDENT_AMBULATORY_CARE_PROVIDER_SITE_OTHER): Payer: No Typology Code available for payment source | Admitting: Endocrinology

## 2019-06-02 ENCOUNTER — Other Ambulatory Visit: Payer: Self-pay

## 2019-06-02 VITALS — BP 120/70 | HR 96 | Ht 72.0 in | Wt 221.0 lb

## 2019-06-02 DIAGNOSIS — E1165 Type 2 diabetes mellitus with hyperglycemia: Secondary | ICD-10-CM | POA: Diagnosis not present

## 2019-06-02 DIAGNOSIS — Z794 Long term (current) use of insulin: Secondary | ICD-10-CM | POA: Diagnosis not present

## 2019-06-02 MED ORDER — TOUJEO MAX SOLOSTAR 300 UNIT/ML ~~LOC~~ SOPN: 220.0000 [IU] | PEN_INJECTOR | SUBCUTANEOUS | 11 refills | Status: DC

## 2019-06-02 MED ORDER — TRULICITY 4.5 MG/0.5ML ~~LOC~~ SOAJ: 4.5000 mg | SUBCUTANEOUS | 11 refills | Status: DC

## 2019-06-02 NOTE — Progress Notes (Signed)
Subjective:    Patient ID: Vincent [name], male    DOB: 1965/05/18, 54 y.o.   MRN: [id number]  HPI Pt returns for f/u of diabetes mellitus:  DM type: Insulin-requiring type 2 Dx'ed: [id number] Complications: PN, TIA, CAD, and AN.   Therapy: insulin since 2005, metformin, and Trulicity.   DKA: never Severe hypoglycemia: never Pancreatitis: never Pancreatic imaging: normal on 2013 CT SDOH: none Other: he has intermittent steroid injections into the lower back; he declines pump rx Interval history: no cbg record, but states cbg's are in the low-100's.  pt states he feels well in general.  No recent steroids.  Past Medical History:  Diagnosis Date  . Anxiety   . Bilateral carpal tunnel syndrome   . Bipolar 1 disorder ([place])   . CAD (coronary artery disease)    Non obstructive on CTA Oct 2019.   Marland Kitchen Chronic pain syndrome    back  . Cold extremities    BLE  . COPD (chronic obstructive pulmonary disease) ([place])   . Cubital tunnel syndrome on right   . DDD (degenerative disc disease), lumbar   . Diabetic peripheral neuropathy ([place])   . Gastroparesis   . GERD (gastroesophageal reflux disease)   . Hiatal hernia   . History of bladder cancer urologist-  dr Consuella Lose   papillay TCC (Ta G1)  s/p TURBT and chemo instillation 2014  . History of bronchitis   . History of carpal tunnel syndrome    Bilateral  . History of chest pain 12/2017  . History of chest pain 12/2017   heart cath normal  . History of encephalopathy 05/27/2015   admission w/ acute encephalopathy thought to be secondary to pain meds and COPD  . History of gastric ulcer   . History of Helicobacter pylori infection   . History of kidney stones    not aware  . History of TIA (transient ischemic attack) 2008    no residual  . History of traumatic head injury 2010   w/ LOC  per pt needed stitches, hit in head with a mower blade  . Hyperlipidemia   . Hypertension   . Hypothyroidism   . Insomnia    per sleep study  04-19-2015 without sleep apnea  . Neuropathy in diabetes ([place])    LOWER EXTREMITIES  . PTSD (post-traumatic stress disorder)   . RA (rheumatoid arthritis) ([place])   . Seizures, transient [facility]) neurologist-  dr Krista Blue--  differential dx complex partial seizure .vs.  mood disorder .vs.  pseudoseizure--  negative EEG's   confusion episodes and staring spells since 11/ 2015  . Sleep apnea    Mild, NO CPAP ORDERED  . Transient confusion NEUOROLOGIST-  DR YAN   Episodes since 11/ 2015--  neurologist dx  differential complex partial seizure  .vs. mood disorder . vs. pseudoseizure  . Tremor   . Type 2 diabetes mellitus treated with insulin [facility])     Past Surgical History:  Procedure Laterality Date  . CARDIAC CATHETERIZATION  12-27-2001  DR Einar Gip  &  05-26-2009  DR Irish Lack   RESULTS FOR BOTH ARE NORMAL CORONARIES AND PERSERVED LVF/ EF 60%  . CARPAL TUNNEL RELEASE Right 09-16-2003  . CARPAL TUNNEL RELEASE Left 02/25/2015   Procedure: LEFT CARPAL TUNNEL RELEASE;  Surgeon: Leanora Cover, MD;  Location: [place];  Service: Orthopedics;  Laterality: Left;  . COLONOSCOPY  2014  . CYSTOSCOPY N/A 10/10/2012   Procedure: CYSTOSCOPY CLOT EVACUATION FULGERATION OF BLEEDERS ;  Surgeon: Elta Guadeloupe  Nedra Hai, MD;  Location: [facility];  Service: Urology;  Laterality: N/A;  . CYSTOSCOPY WITH BIOPSY N/A 11/26/2015   Procedure: CYSTOSCOPY WITH BIOPSY AND FULGURATION;  Surgeon: Kathie Rhodes, MD;  Location: [place];  Service: Urology;  Laterality: N/A;  . ESOPHAGOGASTRODUODENOSCOPY  2014  . LAPAROSCOPIC CHOLECYSTECTOMY  11-17-2010  . NEGATIVE SLEEP STUDY  04-19-2015  in epic  . ORCHIECTOMY Right 02/21/2016   Procedure: SCROTAL ORCHIECTOMY with TESTICULAR PROSTHESIS IMPLANT;  Surgeon: Kathie Rhodes, MD;  Location: [facility];  Service: Urology;  Laterality: Right;  . ORCHIECTOMY Left 09/02/2018   Procedure: ORCHIECTOMY;  Surgeon: Kathie Rhodes, MD;  Location:  [facility];  Service: Urology;  Laterality: Left;  . ROTATOR CUFF REPAIR Right 12/2004  . TRANSURETHRAL RESECTION OF BLADDER TUMOR N/A 08/09/2012   Procedure: TRANSURETHRAL RESECTION OF BLADDER TUMOR (TURBT) WITH GYRUS WITH MITOMYCIN C;  Surgeon: Claybon Jabs, MD;  Location[facility];  Service: Urology;  Laterality: N/A;  . TRANSURETHRAL RESECTION OF BLADDER TUMOR WITH GYRUS (TURBT-GYRUS) N/A 02/27/2014   Procedure: TRANSURETHRAL RESECTION OF BLADDER TUMOR WITH GYRUS (TURBT-GYRUS);  Surgeon: Claybon Jabs, MD;  Location: [facility];  Service: Urology;  Laterality: N/A;    Social History   Socioeconomic History  . Marital status: Married    Spouse name: Not on file  . Number of children: 3  . Years of education: GED  . Highest education level: Not on file  Occupational History  . Occupation: Engineer, technical sales    Comment: Owner of company  Tobacco Use  . Smoking status: Current Every Day Smoker    Packs/day: 2.00    Years: 38.00    Pack years: 76.00    Types: Cigarettes  . Smokeless tobacco: Never Used  . Tobacco comment: Has cut back to 1 pack a day  Substance and Sexual Activity  . Alcohol use: No  . Drug use: No  . Sexual activity: Not Currently    Partners: Female    Birth control/protection: None  Other Topics Concern  . Not on file  Social History Narrative   Lives at home with his wife and children.   Left-handed.   3-4 cups caffeine per day.   Social Determinants of Health   Financial Resource Strain:   . Difficulty of Paying Living Expenses:   Food Insecurity:   . Worried About Charity fundraiser in the Last Year:   . Arboriculturist in the Last Year:   Transportation Needs:   . Film/video editor (Medical):   Marland Kitchen Lack of Transportation (Non-Medical):   Physical Activity:   . Days of Exercise per Week:   . Minutes of Exercise per Session:   Stress:   . Feeling of Stress :   Social  Connections:   . Frequency of Communication with Friends and Family:   . Frequency of Social Gatherings with Friends and Family:   . Attends Religious Services:   . Active Member of Clubs or Organizations:   . Attends Archivist Meetings:   Marland Kitchen Marital Status:   Intimate Partner Violence:   . Fear of Current or Ex-Partner:   . Emotionally Abused:   Marland Kitchen Physically Abused:   . Sexually Abused:     Current Outpatient Medications on File Prior to Visit  Medication Sig Dispense Refill  . albuterol (VENTOLIN HFA) 108 (90 Base) MCG/ACT inhaler Inhale 1-2 puffs into the lungs every 6 (  six) hours as needed for wheezing or shortness of breath. 18 g 0  . amLODipine (NORVASC) 5 MG tablet Take 5 mg by mouth daily.    . ARIPiprazole (ABILIFY) 5 MG tablet Take 1 tablet (5 mg total) by mouth daily. 30 tablet 2  . atorvastatin (LIPITOR) 40 MG tablet Take 1 tablet (40 mg total) by mouth daily at 6 PM. 30 tablet 1  . Cholecalciferol (VITAMIN D3 PO) Take 1,000 Units by mouth daily.    . clopidogrel (PLAVIX) 75 MG tablet Take 1 tablet (75 mg total) by mouth daily. 30 tablet 0  . cyclobenzaprine (FLEXERIL) 10 MG tablet Take 1 tablet by mouth 3 times daily as needed for muscle spasm. Warning: May cause drowsiness. 21 tablet 0  . fentaNYL (DURAGESIC - DOSED MCG/HR) 75 MCG/HR Place 75 mcg onto the skin every 3 (three) days.     Marland Kitchen gabapentin (NEURONTIN) 600 MG tablet Take 600 mg by mouth 2 (two) times daily.    . hydroxychloroquine (PLAQUENIL) 200 MG tablet Take 200 mg by mouth 2 (two) times daily.     . isosorbide mononitrate (IMDUR) 120 MG 24 hr tablet Take 1 tablet (120 mg total) by mouth daily. Please make annual appt in April with Dr. Percival Spanish for refills. Thank you 90 tablet 0  . lamoTRIgine (LAMICTAL) 150 MG tablet Take 1 tablet (150 mg total) by mouth 2 (two) times daily. 60 tablet 11  . levothyroxine (SYNTHROID, LEVOTHROID) 25 MCG tablet Take 25 mcg by mouth daily before breakfast.     . metFORMIN  (GLUCOPHAGE-XR) 500 MG 24 hr tablet Take 500 mg by mouth 2 (two) times daily.    . metoCLOPramide (REGLAN) 10 MG tablet Take 10 mg by mouth 4 (four) times daily.    . mirtazapine ([name]) 30 MG tablet Take 1 tablet (30 mg total) by mouth at bedtime. 30 tablet 2  . mupirocin ointment (BACTROBAN) 2 % Apply 1 application topically 2 (two) times daily. Apply to the affected area 2 times a day 22 g 3  . omeprazole (PRILOSEC) 20 MG capsule Take 20 mg by mouth every morning.     . ondansetron (ZOFRAN-ODT) 4 MG disintegrating tablet Take 1 tablet (4 mg total) by mouth every 8 (eight) hours as needed for nausea or vomiting. 15 tablet 0  . pregabalin (LYRICA) 150 MG capsule Take 150 mg by mouth 2 (two) times daily.    . Prenatal Vit-Fe Fumarate-FA (PREPLUS) 27-1 MG TABS Take 1 tablet by mouth daily.  3  . rosuvastatin (CRESTOR) 10 MG tablet Take 10 mg by mouth daily.    . tamsulosin (FLOMAX) 0.4 MG CAPS capsule Take 0.4 mg by mouth daily.    Marland Kitchen testosterone cypionate (DEPOTESTOSTERONE CYPIONATE) 200 MG/ML injection Inject 1 mL into the muscle every 14 (fourteen) days.    Marland Kitchen venlafaxine XR (EFFEXOR-XR) 150 MG 24 hr capsule Take 1 capsule (150 mg total) by mouth daily with breakfast. 30 capsule 2  . fenofibrate 160 MG tablet Take 1 tablet (160 mg total) by mouth daily. (Patient not taking: Reported on 02/21/2019) 30 tablet 0  . metoprolol tartrate (LOPRESSOR) 25 MG tablet Take 1 tablet (25 mg total) by mouth 2 (two) times daily. (Patient not taking: Reported on 02/21/2019) 60 tablet 0   No current facility-administered medications on file prior to visit.    Allergies  Allergen Reactions  . Celebrex [Celecoxib] Anaphylaxis and Rash  . Hydrocodone Rash and Other (See Comments)    "blisters developed on arms"  .  Sulfa Antibiotics Rash    Family History  Problem Relation Age of Onset  . Diabetes Mother   . Diabetes Father   . Hypertension Father   . Heart attack Father 33       died age 11  . Alcohol  abuse Father     BP 120/70   Pulse 96   Ht 6' (1.829 m)   Wt 221 lb (100.2 kg)   SpO2 98%   BMI 29.97 kg/m    Review of Systems He denies hypoglycemia and nausea.     Objective:   Physical Exam VITAL SIGNS:  See vs page GENERAL: no distress Pulses: dorsalis pedis intact bilat.   MSK: no deformity of the feet CV: no leg edema Skin:  no ulcer on the feet.  normal color and temp on the feet. Neuro: sensation is intact to touch on the feet Ext: there is bilateral onychomycosis of the toenails  Lab Results  Component Value Date   HGBA1C 7.8 (A) 04/30/2019       Assessment & Plan:  Insulin-requiring type 2 DM: he would benefit from increased rx, if it can be done with a regimen that avoids or minimizes hypoglycemia.  Overweight: favoring Trulicity over insulin would help.   Patient Instructions  check your blood sugar twice a day.  vary the time of day when you check, between before the 3 meals, and at bedtime.  also check if you have symptoms of your blood sugar being too high or too low.  please keep a record of the readings and bring it to your next appointment here (or you can bring the meter itself).  You can write it on any piece of paper.  please call us sooner if your blood sugar goes below 70, or if you have a lot of readings over 200. For now, please: increase the Trulicity again, and:  Reduce the [place] to 220 units each morning.   Please come back for a follow-up appointment in 6 weeks.

## 2019-06-02 NOTE — Patient Instructions (Addendum)
check your blood sugar twice a day.  vary the time of day when you check, between before the 3 meals, and at bedtime.  also check if you have symptoms of your blood sugar being too high or too low.  please keep a record of the readings and bring it to your next appointment here (or you can bring the meter itself).  You can write it on any piece of paper.  please call us sooner if your blood sugar goes below 70, or if you have a lot of readings over 200. For now, please: increase the Trulicity again, and:  Reduce the Toujeo to 220 units each morning.   Please come back for a follow-up appointment in 6 weeks.

## 2019-06-04 ENCOUNTER — Other Ambulatory Visit: Payer: Self-pay

## 2019-06-04 ENCOUNTER — Encounter (INDEPENDENT_AMBULATORY_CARE_PROVIDER_SITE_OTHER): Payer: Medicare Other | Admitting: Neurology

## 2019-06-04 ENCOUNTER — Ambulatory Visit (INDEPENDENT_AMBULATORY_CARE_PROVIDER_SITE_OTHER): Payer: No Typology Code available for payment source | Admitting: Neurology

## 2019-06-04 DIAGNOSIS — R29898 Other symptoms and signs involving the musculoskeletal system: Secondary | ICD-10-CM

## 2019-06-04 DIAGNOSIS — R269 Unspecified abnormalities of gait and mobility: Secondary | ICD-10-CM

## 2019-06-04 DIAGNOSIS — M79604 Pain in right leg: Secondary | ICD-10-CM

## 2019-06-04 DIAGNOSIS — Z0289 Encounter for other administrative examinations: Secondary | ICD-10-CM

## 2019-06-04 NOTE — Procedures (Signed)
Full Name: Vincent [name] Gender: Male MRN #: [id number] Date of Birth: Jul 14, 1965    Visit Date: 06/04/2019 08:44 Age: 54 Years Examining Physician: Marcial Pacas, MD  Referring Physician: Marcial Pacas, MD History:    He complains of right anterior thigh pain, 8 out of 10, left right calf pain 5 out of 10, symmetric bilateral plantar feet, toes paresthesia, burning pain 3 out of 10  Summary of the tests:  Nerve conduction study:  Bilateral sural and superficial peroneal sensory responses were absent.  Right ulnar, radial sensory responses showed moderately decreased snap amplitude, right ulnar sensory also has mildly prolonged peak latency.  Bilateral tibial, right peroneal to EDB motor responses showed significantly decreased CMAP amplitude, with moderate slow conduction velocity.  Left peroneal to EDB motor response was absent.  Right ulnar motor response showed mildly prolonged distal latency, with mildly decreased CMAP amplitude, mildly slow conduction velocity.  Electromyography: Selective needle examinations were performed at extensive muscles at bilateral lower extremity muscles, bilateral lumbosacral paraspinal muscles, right upper extremity, right cervical paraspinal muscles.  There is evidence of mild chronic neuropathic changes involving right vastus lateralis, rectus femoris, vastus medialis, adductor longus,  There was no evidence of active denervation involving bilateral lumbosacral paraspinal muscles.  Conclusion: This is an abnormal study.  There is electrodiagnostic is of mild right lumbar plexopathy, involving right L2, 3 myotomes, in the background of moderately axonal peripheral neuropathy.     ------------------------------- Marcial Pacas, M.D. PhD  [facility] [place], [place] [address] Tel: [phone] Fax: [phone]         [facility]    Nerve / Sites Muscle Latency Ref. Amplitude Ref. Rel Amp Segments Distance Velocity Ref.  Area    ms ms mV mV %  cm m/s m/s mVms  R Ulnar - ADM     Wrist ADM 3.3 ?3.3 5.6 ?6.0 100 Wrist - ADM 7   17.0     B.Elbow ADM 8.4  5.8  104 B.Elbow - Wrist 24 47 ?49 17.1     A.Elbow ADM 11.4  5.7  97.7 A.Elbow - B.Elbow 10 33 ?49 23.2         A.Elbow - Wrist      R Peroneal - EDB     Ankle EDB 6.4 ?6.5 1.3 ?2.0 100 Ankle - EDB 9   4.2     Fib head EDB 16.1  0.7  58.6 Fib head - Ankle 31 32 ?44 3.3     Pop fossa EDB 19.2  0.7  94.8 Pop fossa - Fib head 10 32 ?44 3.6         Pop fossa - Ankle      L Peroneal - EDB     Ankle EDB NR ?6.5 NR ?2.0 NR Ankle - EDB 9   NR     Fib head EDB NR  NR  NR Fib head - Ankle 32 NR ?44 NR  R Tibial - AH     Ankle AH 4.2 ?5.8 1.0 ?4.0 100 Ankle - AH 9   6.3     Pop fossa AH 17.5  0.4  36.8 Pop fossa - Ankle 41 31 ?41 1.3  L Tibial - AH     Ankle AH 4.8 ?5.8 0.4 ?4.0 100 Ankle - AH 9   2.5     Pop fossa AH 20.6  0.2  47 Pop fossa - Ankle 41 26 ?41 1.9  [place]    Nerve / Sites Rec. Site Peak Lat Ref.  Amp Ref. Segments Distance    ms ms V V  cm  R Radial - Anatomical snuff box (Forearm)     Forearm Wrist 2.8 ?2.9 8 ?15 Forearm - Wrist 10  R Sural - Ankle (Calf)     Calf Ankle NR ?4.4 NR ?6 Calf - Ankle 14  L Sural - Ankle (Calf)     Calf Ankle NR ?4.4 NR ?6 Calf - Ankle 14  R Superficial peroneal - Ankle     Lat leg Ankle NR ?4.4 NR ?6 Lat leg - Ankle 14  L Superficial peroneal - Ankle     Lat leg Ankle NR ?4.4 NR ?6 Lat leg - Ankle 14  R Ulnar - Orthodromic, (Dig V, Mid palm)     Dig V Wrist 3.6 ?3.1 3 ?5 Dig V - Wrist 17                 F  Wave    Nerve F Lat Ref.   ms ms  R Tibial - AH 83.0 ?56.0  L Tibial - AH 82.6 ?56.0  R Ulnar - ADM 39.2 ?32.0           EMG Summary Table    Spontaneous MUAP Recruitment  Muscle IA Fib PSW Fasc Other Amp Dur. Poly Pattern  R. Tibialis anterior Normal None None None _______ Normal Normal Normal Normal  R. Tibialis posterior Normal None None None _______ Normal Normal Normal Normal  R.  Peroneus longus Normal None None None _______ Normal Normal Normal Normal  R. Gastrocnemius (Medial head) Normal None None None _______ Normal Normal Normal Normal  R. Vastus lateralis Increased None None None _______ Normal Increased 1+ Reduced  R. Vastus medialis Normal None None None _______ Normal Normal Normal Reduced  R. Rectus femoris Normal None None None _______ Increased Normal 1+ Reduced  R. Adductor Longus Normal None None None _______ Normal Normal Reduced Reduced  R. Iliopsoas Normal None None None _______ Normal Normal Normal Normal  R. Gluteus medius Normal None None None _______ Normal Normal Normal Normal  R. Lumbar paraspinals (mid) Normal None None None _______ Normal Normal Normal Normal  R. Lumbar paraspinals (low) Normal None None None _______ Normal Normal Normal Normal  L. Tibialis anterior Normal None None None _______ Normal Normal Normal Normal  L. Tibialis posterior Normal None None None _______ Normal Normal Normal Normal  L. Peroneus longus Normal None None None _______ Normal Normal Normal Normal  L. Vastus lateralis Normal None None None _______ Normal Normal Normal Normal  L. Vastus medialis Normal None None None _______ Normal Normal Normal Normal  L. Adductor  Longus Normal None None None _______ Normal Normal Normal Normal  L. Iliopsoas Normal None None None _______ Normal Normal Normal Normal  L. Rectus femoris Normal None None None _______ Normal Normal Normal Normal  L. Lumbar paraspinals (mid) Normal None None None _______ Normal Normal Normal Normal  L. Lumbar paraspinals (low) Normal None None None _______ Normal Normal Normal Normal  R. First dorsal interosseous Normal None None None _______ Normal Normal Normal Normal  R. Pronator teres Normal None None None _______ Normal Normal Normal Normal  R. Biceps brachii Normal None None None _______ Normal Normal Normal Normal  R. Triceps brachii Normal None None None _______ Normal Normal Normal Normal   R. Abductor hallucis Increased None None None _______ Normal Normal 1+ Reduced

## 2019-06-05 ENCOUNTER — Ambulatory Visit (INDEPENDENT_AMBULATORY_CARE_PROVIDER_SITE_OTHER): Payer: No Typology Code available for payment source | Admitting: Physician Assistant

## 2019-06-05 ENCOUNTER — Other Ambulatory Visit: Payer: Self-pay

## 2019-06-05 ENCOUNTER — Encounter: Payer: Self-pay | Admitting: Physician Assistant

## 2019-06-05 ENCOUNTER — Other Ambulatory Visit (INDEPENDENT_AMBULATORY_CARE_PROVIDER_SITE_OTHER): Payer: No Typology Code available for payment source

## 2019-06-05 VITALS — BP 122/80 | HR 62 | Temp 97.2°F | Ht 72.0 in | Wt 218.0 lb

## 2019-06-05 DIAGNOSIS — R197 Diarrhea, unspecified: Secondary | ICD-10-CM | POA: Diagnosis not present

## 2019-06-05 DIAGNOSIS — Z8639 Personal history of other endocrine, nutritional and metabolic disease: Secondary | ICD-10-CM | POA: Diagnosis not present

## 2019-06-05 DIAGNOSIS — R112 Nausea with vomiting, unspecified: Secondary | ICD-10-CM | POA: Diagnosis not present

## 2019-06-05 DIAGNOSIS — R1032 Left lower quadrant pain: Secondary | ICD-10-CM

## 2019-06-05 DIAGNOSIS — R7989 Other specified abnormal findings of blood chemistry: Secondary | ICD-10-CM

## 2019-06-05 LAB — CBC WITH DIFFERENTIAL/PLATELET
Basophils Absolute: 0.1 10*3/uL (ref 0.0–0.1)
Basophils Relative: 0.7 % (ref 0.0–3.0)
Eosinophils Absolute: 0.1 10*3/uL (ref 0.0–0.7)
Eosinophils Relative: 0.6 % (ref 0.0–5.0)
HCT: 40.5 % (ref 39.0–52.0)
Hemoglobin: 14.2 g/dL (ref 13.0–17.0)
Lymphocytes Relative: 19.9 % (ref 12.0–46.0)
Lymphs Abs: 1.8 10*3/uL (ref 0.7–4.0)
MCHC: 35 g/dL (ref 30.0–36.0)
MCV: 84.3 fl (ref 78.0–100.0)
Monocytes Absolute: 0.7 10*3/uL (ref 0.1–1.0)
Monocytes Relative: 7.2 % (ref 3.0–12.0)
Neutro Abs: 6.6 10*3/uL (ref 1.4–7.7)
Neutrophils Relative %: 71.6 % (ref 43.0–77.0)
Platelets: 303 10*3/uL (ref 150.0–400.0)
RBC: 4.81 Mil/uL (ref 4.22–5.81)
RDW: 13.1 % (ref 11.5–15.5)
WBC: 9.2 10*3/uL (ref 4.0–10.5)

## 2019-06-05 LAB — COMPREHENSIVE METABOLIC PANEL
ALT: 71 U/L — ABNORMAL HIGH (ref 0–53)
AST: 72 U/L — ABNORMAL HIGH (ref 0–37)
Albumin: 4.5 g/dL (ref 3.5–5.2)
Alkaline Phosphatase: 104 U/L (ref 39–117)
BUN: 10 mg/dL (ref 6–23)
CO2: 27 mEq/L (ref 19–32)
Calcium: 9.7 mg/dL (ref 8.4–10.5)
Chloride: 102 mEq/L (ref 96–112)
Creatinine, Ser: 0.9 mg/dL (ref 0.40–1.50)
GFR: 88.62 mL/min (ref >60.00)
Glucose, Bld: 193 mg/dL — ABNORMAL HIGH (ref 70–99)
Potassium: 4.6 mEq/L (ref 3.5–5.1)
Sodium: 137 mEq/L (ref 135–145)
Total Bilirubin: 0.8 mg/dL (ref 0.2–1.2)
Total Protein: 7.2 g/dL (ref 6.0–8.3)

## 2019-06-05 MED ORDER — DIPHENOXYLATE-ATROPINE 2.5-0.025 MG PO TABS: 1.0000 | ORAL_TABLET | Freq: Four times a day (QID) | ORAL | 0 refills | Status: DC

## 2019-06-05 NOTE — Progress Notes (Signed)
Chief Complaint: Abdominal pain and diarrhea  HPI:    Vincent [name] is a 54 year old male, who followed with Dr. Henrene Pastor for colonoscopy in the past, with a past medical history as listed below including CAD, uncontrolled diabetes, COPD and many others listed below, who was referred to me by Hayden Rasmussen, MD for a complaint of diarrhea.     07/04/2002 colonoscopy for weight loss and change in bowel habits was normal.  It was thought symptoms were due to poorly controlled diabetes.    04/30/2019 hemoglobin A1c 7.8, previously 10.3/11.4 over the past year.    Today, the patient presents to clinic and explains that over the past 2 and half months he has been having watery diarrhea which is mostly at night.  Describes 5-7 bowel movements which wake him up throughout the night, sometimes can go all day without any but also has some diarrhea during the day.  Also has been having some sulfur burps.  Started Questran a month and a half ago which seems to help taking 2 packs 3 times daily after his history of a cholecystectomy at least 5+ years ago.  This does not completely relieve it though.  Is associated with abdominal cramping which he rates as a 2-3/10 which is all the time and not necessarily removed with a bowel movement.  Also describes days of nausea and some vomiting of food that he just eats.  Does describe a history of gastroparesis related to his diabetes.  Also reports a 40 pound weight loss over the initial 2 weeks of symptoms, stable since then.  Also tried Lomotil and Imodium as needed which were not helpful.    Denies fever, chills, heartburn, reflux or blood in stool.  Past Medical History:  Diagnosis Date  . Anxiety   . Bilateral carpal tunnel syndrome   . Bipolar 1 disorder ([place])   . CAD (coronary artery disease)    Non obstructive on CTA Oct 2019.   Marland Kitchen Chronic pain syndrome    back  . Cold extremities    BLE  . COPD (chronic obstructive pulmonary disease) ([place])   . Cubital tunnel  syndrome on right   . DDD (degenerative disc disease), lumbar   . Diabetic peripheral neuropathy ([place])   . Gastroparesis   . GERD (gastroesophageal reflux disease)   . Hiatal hernia   . History of bladder cancer urologist-  dr Consuella Lose   papillay TCC (Ta G1)  s/p TURBT and chemo instillation 2014  . History of bronchitis   . History of carpal tunnel syndrome    Bilateral  . History of chest pain 12/2017  . History of chest pain 12/2017   heart cath normal  . History of encephalopathy 05/27/2015   admission w/ acute encephalopathy thought to be secondary to pain meds and COPD  . History of gastric ulcer   . History of Helicobacter pylori infection   . History of kidney stones    not aware  . History of TIA (transient ischemic attack) 2008    no residual  . History of traumatic head injury 2010   w/ LOC  per pt needed stitches, hit in head with a mower blade  . Hyperlipidemia   . Hypertension   . Hypothyroidism   . Insomnia    per sleep study 04-19-2015 without sleep apnea  . Neuropathy in diabetes ([place])    LOWER EXTREMITIES  . PTSD (post-traumatic stress disorder)   . RA (rheumatoid arthritis) ([place])   .  Seizures, transient [facility]) neurologist-  dr Krista Blue--  differential dx complex partial seizure .vs.  mood disorder .vs.  pseudoseizure--  negative EEG's   confusion episodes and staring spells since 11/ 2015  . Sleep apnea    Mild, NO CPAP ORDERED  . Transient confusion NEUOROLOGIST-  DR YAN   Episodes since 11/ 2015--  neurologist dx  differential complex partial seizure  .vs. mood disorder . vs. pseudoseizure  . Tremor   . Type 2 diabetes mellitus treated with insulin [facility])     Past Surgical History:  Procedure Laterality Date  . CARDIAC CATHETERIZATION  12-27-2001  DR Einar Gip  &  05-26-2009  DR Irish Lack   RESULTS FOR BOTH ARE NORMAL CORONARIES AND PERSERVED LVF/ EF 60%  . CARPAL TUNNEL RELEASE Right 09-16-2003  . CARPAL TUNNEL RELEASE Left 02/25/2015   Procedure: LEFT CARPAL  TUNNEL RELEASE;  Surgeon: Leanora Cover, MD;  Location: [place];  Service: Orthopedics;  Laterality: Left;  . COLONOSCOPY  2014  . CYSTOSCOPY N/A 10/10/2012   Procedure: CYSTOSCOPY CLOT EVACUATION FULGERATION OF BLEEDERS ;  Surgeon: Claybon Jabs, MD;  Location: [facility];  Service: Urology;  Laterality: N/A;  . CYSTOSCOPY WITH BIOPSY N/A 11/26/2015   Procedure: CYSTOSCOPY WITH BIOPSY AND FULGURATION;  Surgeon: Kathie Rhodes, MD;  Location: [place];  Service: Urology;  Laterality: N/A;  . ESOPHAGOGASTRODUODENOSCOPY  2014  . LAPAROSCOPIC CHOLECYSTECTOMY  11-17-2010  . NEGATIVE SLEEP STUDY  04-19-2015  in epic  . ORCHIECTOMY Right 02/21/2016   Procedure: SCROTAL ORCHIECTOMY with TESTICULAR PROSTHESIS IMPLANT;  Surgeon: Kathie Rhodes, MD;  [facility];  Service: Urology;  Laterality: Right;  . ORCHIECTOMY Left 09/02/2018   Procedure: ORCHIECTOMY;  Surgeon: Kathie Rhodes, MD;  Location: [facility];  Service: Urology;  Laterality: Left;  . ROTATOR CUFF REPAIR Right 12/2004  . TRANSURETHRAL RESECTION OF BLADDER TUMOR N/A 08/09/2012   Procedure: TRANSURETHRAL RESECTION OF BLADDER TUMOR (TURBT) WITH GYRUS WITH MITOMYCIN C;  Surgeon: Claybon Jabs, MD;  Location: [facility];  Service: Urology;  Laterality: N/A;  . TRANSURETHRAL RESECTION OF BLADDER TUMOR WITH GYRUS (TURBT-GYRUS) N/A 02/27/2014   Procedure: TRANSURETHRAL RESECTION OF BLADDER TUMOR WITH GYRUS (TURBT-GYRUS);  Surgeon: Claybon Jabs, MD;  Location: [facility];  Service: Urology;  Laterality: N/A;    Current Outpatient Medications  Medication Sig Dispense Refill  . albuterol (VENTOLIN HFA) 108 (90 Base) MCG/ACT inhaler Inhale 1-2 puffs into the lungs every 6 (six) hours as needed for wheezing or shortness of breath. 18 g 0  . amLODipine (NORVASC) 5 MG tablet Take 5 mg by mouth daily.    . ARIPiprazole (ABILIFY) 5 MG  tablet Take 1 tablet (5 mg total) by mouth daily. 30 tablet 2  . atorvastatin (LIPITOR) 40 MG tablet Take 1 tablet (40 mg total) by mouth daily at 6 PM. 30 tablet 1  . Cholecalciferol (VITAMIN D3 PO) Take 1,000 Units by mouth daily.    . cyclobenzaprine (FLEXERIL) 10 MG tablet Take 1 tablet by mouth 3 times daily as needed for muscle spasm. Warning: May cause drowsiness. 21 tablet 0  . Dulaglutide (TRULICITY) 4.5 0000000 SOPN Inject 4.5 mg into the skin once a week. 12 pen 11  . fentaNYL (DURAGESIC - DOSED MCG/HR) 75 MCG/HR Place 75 mcg onto the skin every 3 (three) days.     Marland Kitchen gabapentin (NEURONTIN) 600 MG tablet Take 600 mg by mouth 2 (two) times daily.    Marland Kitchen  hydroxychloroquine (PLAQUENIL) 200 MG tablet Take 200 mg by mouth 2 (two) times daily.     . insulin glargine, 2 Unit Dial, (TOUJEO MAX SOLOSTAR) 300 UNIT/ML Solostar Pen Inject 220 Units into the skin every morning. And pen needles 1/day 10 pen 11  . isosorbide mononitrate (IMDUR) 120 MG 24 hr tablet Take 1 tablet (120 mg total) by mouth daily. Please make annual appt in April with Dr. Percival Spanish for refills. Thank you 90 tablet 0  . lamoTRIgine (LAMICTAL) 150 MG tablet Take 1 tablet (150 mg total) by mouth 2 (two) times daily. 60 tablet 11  . levothyroxine (SYNTHROID, LEVOTHROID) 25 MCG tablet Take 25 mcg by mouth daily before breakfast.     . metFORMIN (GLUCOPHAGE-XR) 500 MG 24 hr tablet Take 500 mg by mouth 2 (two) times daily.    . metoCLOPramide (REGLAN) 10 MG tablet Take 10 mg by mouth 4 (four) times daily.    . metoprolol tartrate (LOPRESSOR) 25 MG tablet Take 1 tablet (25 mg total) by mouth 2 (two) times daily. (Patient not taking: Reported on 02/21/2019) 60 tablet 0  . mirtazapine ([name]) 30 MG tablet Take 1 tablet (30 mg total) by mouth at bedtime. 30 tablet 2  . mupirocin ointment (BACTROBAN) 2 % Apply 1 application topically 2 (two) times daily. Apply to the affected area 2 times a day 22 g 3  . omeprazole (PRILOSEC) 20 MG  capsule Take 20 mg by mouth every morning.     . ondansetron (ZOFRAN-ODT) 4 MG disintegrating tablet Take 1 tablet (4 mg total) by mouth every 8 (eight) hours as needed for nausea or vomiting. 15 tablet 0  . pregabalin (LYRICA) 150 MG capsule Take 150 mg by mouth 2 (two) times daily.    . Prenatal Vit-Fe Fumarate-FA (PREPLUS) 27-1 MG TABS Take 1 tablet by mouth daily.  3  . rosuvastatin (CRESTOR) 10 MG tablet Take 10 mg by mouth daily.    . tamsulosin (FLOMAX) 0.4 MG CAPS capsule Take 0.4 mg by mouth daily.    Marland Kitchen testosterone cypionate (DEPOTESTOSTERONE CYPIONATE) 200 MG/ML injection Inject 1 mL into the muscle every 14 (fourteen) days.    Marland Kitchen venlafaxine XR (EFFEXOR-XR) 150 MG 24 hr capsule Take 1 capsule (150 mg total) by mouth daily with breakfast. 30 capsule 2   No current facility-administered medications for this visit.    Allergies as of 06/05/2019 - Review Complete 06/02/2019  Allergen Reaction Noted  . Celebrex [celecoxib] Anaphylaxis and Rash 10/13/2010  . Hydrocodone Rash and Other (See Comments) 02/23/2014  . Sulfa antibiotics Rash 10/13/2010    Family History  Problem Relation Age of Onset  . Diabetes Mother   . Diabetes Father   . Hypertension Father   . Heart attack Father 13       died age 48  . Alcohol abuse Father     Social History   Socioeconomic History  . Marital status: Married    Spouse name: Not on file  . Number of children: 3  . Years of education: GED  . Highest education level: Not on file  Occupational History  . Occupation: Engineer, technical sales    Comment: Owner of company  Tobacco Use  . Smoking status: Current Every Day Smoker    Packs/day: 2.00    Years: 38.00    Pack years: 76.00    Types: Cigarettes  . Smokeless tobacco: Never Used  . Tobacco comment: Has cut back to 1 pack a day  Substance and Sexual  Activity  . Alcohol use: No  . Drug use: No  . Sexual activity: Not Currently    Partners: Female    Birth  control/protection: None  Other Topics Concern  . Not on file  Social History Narrative   Lives at home with his wife and children.   Left-handed.   3-4 cups caffeine per day.   Social Determinants of Health   Financial Resource Strain:   . Difficulty of Paying Living Expenses:   Food Insecurity:   . Worried About Charity fundraiser in the Last Year:   . Arboriculturist in the Last Year:   Transportation Needs:   . Film/video editor (Medical):   Marland Kitchen Lack of Transportation (Non-Medical):   Physical Activity:   . Days of Exercise per Week:   . Minutes of Exercise per Session:   Stress:   . Feeling of Stress :   Social Connections:   . Frequency of Communication with Friends and Family:   . Frequency of Social Gatherings with Friends and Family:   . Attends Religious Services:   . Active Member of Clubs or Organizations:   . Attends Archivist Meetings:   Marland Kitchen Marital Status:   Intimate Partner Violence:   . Fear of Current or Ex-Partner:   . Emotionally Abused:   Marland Kitchen Physically Abused:   . Sexually Abused:     Review of Systems:    Constitutional: No fever or chills Skin: No rash  Cardiovascular: No chest pain Respiratory: No SOB Gastrointestinal: See HPI and otherwise negative Genitourinary: No dysuria Neurological: No headache Musculoskeletal: No new muscle or joint pain Hematologic: No bleeding  Psychiatric: No history of depression or anxiety   Physical Exam:  Vital signs: BP 122/80 (BP Location: Right Arm, Patient Position: Sitting, Cuff Size: Normal)   Pulse 62   Temp (!) 97.2 F (36.2 C) (Other (Comment))   Ht 6' (1.829 m)   Wt 218 lb (98.9 kg)   BMI 29.57 kg/m   Constitutional:   Pleasant ill appearing Caucasian male appears to be in NAD, Well developed, Well nourished, alert and cooperative Head:  Normocephalic and atraumatic. Eyes:   PEERL, EOMI. No icterus. Conjunctiva pink. Ears:  Normal auditory acuity. Neck:  Supple Throat: Oral  cavity and pharynx without inflammation, swelling or lesion.  Respiratory: Respirations even and unlabored. b/l wheezing posteriorly Cardiovascular: Normal S1, S2. No MRG. Regular rate and rhythm. No peripheral edema, cyanosis or pallor.  Gastrointestinal:  Soft, nondistended, marked ttp in LLQ with involuntary guarding, Normal bowel sounds. No appreciable masses or hepatomegaly. Rectal:  Not performed.  Msk:  Symmetrical without gross deformities. Without edema, no deformity or joint abnormality. Uses cane to ambulate Neurologic:  Alert and  oriented x4;  grossly normal neurologically.  Skin:   Dry and intact without significant lesions or rashes. Psychiatric: Demonstrates good judgement and reason without abnormal affect or behaviors.  See HPI for noted labs.  Assessment: 1.  Diarrhea: For the past 2-1/2 months, some slowing with Questran, no help from as needed Lomotil or Imodium, history of uncontrolled diabetes and cholecystectomy, significant left lower quadrant pain on exam; consider colitis/diverticulitis versus bile salt diarrhea versus diabetic colopathy versus infectious or inflammatory cause 2.  Left lower quadrant pain: Concern for diverticulitis 4.  Nausea and vomiting: Occasionally off-and-on per patient, diagnosed with gastroparesis in the past which is likely the cause 5.  History of gastroparesis  Plan: 1.  Schedule patient for CT abdomen pelvis  with contrast for further evaluation of marked left lower quadrant pain today. 2.  Ordered CBC and CMP. 3.  Ordered stool studies to include GI pathogen panel, C. difficile, lactoferrin and O&P 4.  Prescribe Lomotil to be taken 1 tab every 6 hours scheduled #60 with a refill. 5.  Patient can continue Questran as this seems to be helping slightly. 6.  Discussed with patient that pending results from above he will likely require an EGD and colonoscopy with Dr. Henrene Pastor in the future for further evaluation. 7.  Patient to follow in clinic  for recommendations after labs and imaging above.  Ellouise Newer, PA-C [facility] 06/05/2019, 10:09 AM  Cc: Hayden Rasmussen, MD

## 2019-06-05 NOTE — Patient Instructions (Signed)
If you are age 52 or older, your body mass index should be between 23-30. Your Body mass index is 29.57 kg/m. If this is out of the aforementioned range listed, please consider follow up with your Primary Care Provider.  If you are age 40 or younger, your body mass index should be between 19-25. Your Body mass index is 29.57 kg/m. If this is out of the aformentioned range listed, please consider follow up with your Primary Care Provider.   You have been scheduled for a CT scan of the abdomen and pelvis at [facility] ([address]---this is in the same building as [employer]).   You are scheduled on 06/11/19 at 9:00 am. You should arrive 15 minutes prior to your appointment time for registration. Please follow the written instructions below on the day of your exam:  WARNING: IF YOU ARE ALLERGIC TO IODINE/X-RAY DYE, PLEASE NOTIFY RADIOLOGY IMMEDIATELY AT [phone]! YOU WILL BE GIVEN A 13 HOUR PREMEDICATION PREP.  1) Do not eat or drink anything after 5:00 am (4 hours prior to your test) 2) You have been given 2 bottles of oral contrast to drink. The solution may taste better if refrigerated, but do NOT add ice or any other liquid to this solution. Shake well before drinking.    Drink 1 bottle of contrast @ 7:00 am (2 hours prior to your exam)  Drink 1 bottle of contrast @ 8:00 am (1 hour prior to your exam)  You may take any medications as prescribed with a small amount of water, if necessary. If you take any of the following medications: METFORMIN, GLUCOPHAGE, GLUCOVANCE, AVANDAMET, RIOMET, FORTAMET, [place] MET, JANUMET, GLUMETZA or METAGLIP, you MAY be asked to HOLD this medication 48 hours AFTER the exam.  The purpose of you drinking the oral contrast is to aid in the visualization of your intestinal tract. The contrast solution may cause some diarrhea. Depending on your individual set of symptoms, you may also receive an intravenous injection of x-ray  contrast/dye. Plan on being at [facility] for 30 minutes or longer, depending on the type of exam you are having performed.  This test typically takes 30-45 minutes to complete.  If you have any questions regarding your exam or if you need to reschedule, you may call the CT department at [phone] between the hours of 8:00 am and 5:00 pm, Monday-Friday.  ________________________________________________________________________  START Lomotil 1 tablet every 6 hours.  Continue the Questran that your primary care prescribed for you.  Follow up pending the results of your CT and labs.

## 2019-06-05 NOTE — Progress Notes (Signed)
Agree with assessment and plan.  We will need colonoscopy at some point, regardless, for screening as it has been greater than 10 years.

## 2019-06-10 ENCOUNTER — Other Ambulatory Visit: Payer: No Typology Code available for payment source

## 2019-06-10 DIAGNOSIS — R1032 Left lower quadrant pain: Secondary | ICD-10-CM

## 2019-06-10 DIAGNOSIS — R197 Diarrhea, unspecified: Secondary | ICD-10-CM

## 2019-06-11 ENCOUNTER — Other Ambulatory Visit: Payer: Self-pay

## 2019-06-11 ENCOUNTER — Ambulatory Visit (INDEPENDENT_AMBULATORY_CARE_PROVIDER_SITE_OTHER)
Admission: RE | Admit: 2019-06-11 | Discharge: 2019-06-11 | Disposition: A | Discharge status: Home / Self Care | Payer: No Typology Code available for payment source | Source: Ambulatory Visit | Attending: Physician Assistant | Admitting: Physician Assistant

## 2019-06-11 DIAGNOSIS — M5136 Other intervertebral disc degeneration, lumbar region: Secondary | ICD-10-CM | POA: Diagnosis not present

## 2019-06-11 DIAGNOSIS — R197 Diarrhea, unspecified: Secondary | ICD-10-CM

## 2019-06-11 DIAGNOSIS — R1032 Left lower quadrant pain: Secondary | ICD-10-CM | POA: Diagnosis not present

## 2019-06-11 DIAGNOSIS — G894 Chronic pain syndrome: Secondary | ICD-10-CM | POA: Diagnosis not present

## 2019-06-11 DIAGNOSIS — M47816 Spondylosis without myelopathy or radiculopathy, lumbar region: Secondary | ICD-10-CM | POA: Diagnosis not present

## 2019-06-11 DIAGNOSIS — F4310 Post-traumatic stress disorder, unspecified: Secondary | ICD-10-CM | POA: Diagnosis not present

## 2019-06-11 MED ORDER — IOHEXOL 300 MG/ML  SOLN
100.0000 mL | Freq: Once | INTRAMUSCULAR | Status: AC | PRN
  Administered 2019-06-11: 100 mL via INTRAVENOUS

## 2019-06-12 LAB — GI PROFILE, STOOL, PCR
Adenovirus F 40/41: NOT DETECTED
Astrovirus: NOT DETECTED
C difficile toxin A/B: NOT DETECTED
Campylobacter: NOT DETECTED
Cryptosporidium: NOT DETECTED
Cyclospora cayetanensis: NOT DETECTED
E coli O157: NOT DETECTED
Entamoeba histolytica: NOT DETECTED
Enteroaggregative E coli: NOT DETECTED
Enteropathogenic E coli: NOT DETECTED
Enterotoxigenic E coli: NOT DETECTED
Giardia lamblia: NOT DETECTED
Norovirus GI/GII: NOT DETECTED
Plesiomonas shigelloides: NOT DETECTED
Rotavirus A: NOT DETECTED
Salmonella: NOT DETECTED
Sapovirus: NOT DETECTED
Shiga-toxin-producing E coli: NOT DETECTED
Shigella/Enteroinvasive E coli: NOT DETECTED
Vibrio cholerae: NOT DETECTED
Vibrio: NOT DETECTED
Yersinia enterocolitica: NOT DETECTED

## 2019-06-12 LAB — CLOSTRIDIUM DIFFICILE EIA: C difficile Toxins A+B, EIA: NEGATIVE

## 2019-06-13 LAB — OVA AND PARASITE EXAMINATION
CONCENTRATE RESULT:: NONE SEEN
MICRO NUMBER:: 10307451
SPECIMEN QUALITY:: ADEQUATE
TRICHROME RESULT:: NONE SEEN

## 2019-06-13 LAB — FECAL LACTOFERRIN, QUANT
Fecal Lactoferrin: NEGATIVE
MICRO NUMBER:: 10307472
SPECIMEN QUALITY:: ADEQUATE

## 2019-06-16 ENCOUNTER — Encounter: Payer: Self-pay | Admitting: Internal Medicine

## 2019-06-16 ENCOUNTER — Telehealth: Payer: Self-pay | Admitting: Physician Assistant

## 2019-06-16 NOTE — Telephone Encounter (Signed)
Vincent Erp, PA  [facility], Ayahna Solazzo L, RN  Normal stool studies.   We need to proceed with EGD and Colonoscopy with Dr. Henrene Pastor in [facility]. Please schedule.   Thanks-JLL    the pt has been scheduled for endo colon with Dr Henrene Pastor. Pt aware

## 2019-06-16 NOTE — Telephone Encounter (Signed)
Patient's wife returned call I advise her of lab note and scheduled patient for a double with Dr. Henrene Pastor.

## 2019-06-25 NOTE — Progress Notes (Signed)
OMARRI, NGAN ([id number]) Visit Report for 04/30/2019 Arrival Information Details Patient Name: Date of Service: Vincent [name], Vincent [name]. 04/30/2019 9:45 AM Medical Record [id number] Patient Account Number: [id number] Date of Birth/Sex: Treating RN: June 14, 1965 (54 y.o. Ernestene Mention Primary Care Stanisha Lorenz: Hayden Rasmussen Other Clinician: Referring Persephanie Laatsch: Treating Cyenna Rebello/Extender:Stone III, Ileene Musa, Penelope Galas in Treatment: 2 Visit Information History Since Last Visit Added or deleted any medications: No Patient Arrived: Kasandra Knudsen Any new allergies or adverse reactions: No Arrival Time: 10:19 Had a fall or experienced change in No Accompanied By: self activities of daily living that may affect Transfer Assistance: None risk of falls: Patient Identification Verified: Yes Signs or symptoms of abuse/neglect since last No Secondary Verification Process Yes visito Completed: Hospitalized since last visit: No Patient Requires Transmission-Based No Implantable device outside of the clinic excluding No Precautions: cellular tissue based products placed in the center Patient Has Alerts: Yes since last visit: Patient Alerts: R ABI 1.25 TBI Has Dressing in Place as Prescribed: Yes 0.94 Pain Present Now: Yes L ABI 1.28 TBI 1.28 Electronic Signature(s) Signed: 06/25/2019 9:23:26 AM By: Sandre Kitty Entered By: Sandre Kitty on 04/30/2019 10:22:15 -------------------------------------------------------------------------------- Clinic Level of Care Assessment Details Patient Name: Date of Service: SUNNY, DASINGER. 04/30/2019 9:45 AM Medical Record [id number] Patient Account Number: [id number] Date of Birth/Sex: Treating RN: 1965/04/08 (54 y.o. Ernestene Mention Primary Care Paras Kreider: Hayden Rasmussen Other Clinician: Referring Adrick Kestler: Treating Karolina Zamor/Extender:Stone III, Ileene Musa, Penelope Galas in Treatment: 2 Clinic Level of Care Assessment  Items TOOL 4 Quantity Score []  - Use when only an EandM is performed on FOLLOW-UP visit 0 ASSESSMENTS - Nursing Assessment / Reassessment X - Reassessment of Co-morbidities (includes updates in patient status) 1 10 X - Reassessment of Adherence to Treatment Plan 1 5 ASSESSMENTS - Wound and Skin Assessment / Reassessment X - Simple Wound Assessment / Reassessment - one wound 1 5 []  - Complex Wound Assessment / Reassessment - multiple wounds 0 []  - Dermatologic / Skin Assessment (not related to wound area) 0 ASSESSMENTS - Focused Assessment []  - Circumferential Edema Measurements - multi extremities 0 []  - Nutritional Assessment / Counseling / Intervention 0 X - Lower Extremity Assessment (monofilament, tuning fork, pulses) 1 5 []  - Peripheral Arterial Disease Assessment (using hand held doppler) 0 ASSESSMENTS - Ostomy and/or Continence Assessment and Care []  - Incontinence Assessment and Management 0 []  - Ostomy Care Assessment and Management (repouching, etc.) 0 PROCESS - Coordination of Care X - Simple Patient / Family Education for ongoing care 1 15 []  - Complex (extensive) Patient / Family Education for ongoing care 0 X - Staff obtains Programmer, systems, Records, Test Results / Process Orders 1 10 X - Staff telephones HHA, Nursing Homes / Clarify orders / etc 1 10 []  - Routine Transfer to another Facility (non-emergent condition) 0 []  - Routine Hospital Admission (non-emergent condition) 0 []  - New Admissions / Biomedical engineer / Ordering NPWT, Apligraf, etc. 0 []  - Emergency Hospital Admission (emergent condition) 0 X - Simple Discharge Coordination 1 10 []  - Complex (extensive) Discharge Coordination 0 PROCESS - Special Needs []  - Pediatric / Minor Patient Management 0 []  - Isolation Patient Management 0 []  - Hearing / Language / Visual special needs 0 []  - Assessment of Community assistance (transportation, D/C planning, etc.) 0 []  - Additional assistance / Altered mentation  0 []  - Support Surface(s) Assessment (bed, cushion, seat, etc.) 0 INTERVENTIONS - Wound Cleansing / Measurement X - Simple Wound Cleansing -  one wound 1 5 []  - Complex Wound Cleansing - multiple wounds 0 X - Wound Imaging (photographs - any number of wounds) 1 5 []  - Wound Tracing (instead of photographs) 0 X - Simple Wound Measurement - one wound 1 5 []  - Complex Wound Measurement - multiple wounds 0 INTERVENTIONS - Wound Dressings []  - Small Wound Dressing one or multiple wounds 0 []  - Medium Wound Dressing one or multiple wounds 0 []  - Large Wound Dressing one or multiple wounds 0 []  - Application of Medications - topical 0 []  - Application of Medications - injection 0 INTERVENTIONS - Miscellaneous []  - External ear exam 0 []  - Specimen Collection (cultures, biopsies, blood, body fluids, etc.) 0 []  - Specimen(s) / Culture(s) sent or taken to Lab for analysis 0 []  - Patient Transfer (multiple staff / Civil Service fast streamer / Similar devices) 0 []  - Simple Staple / Suture removal (25 or less) 0 []  - Complex Staple / Suture removal (26 or more) 0 []  - Hypo / Hyperglycemic Management (close monitor of Blood Glucose) 0 []  - Ankle / Brachial Index (ABI) - do not check if billed separately 0 X - Vital Signs 1 5 Has the patient been seen at the hospital within the last three years: Yes Total Score: 90 Level Of Care: New/Established - Level 3 Electronic Signature(s) Signed: 05/02/2019 6:14:01 PM By: Baruch Gouty RN, BSN Entered By: Baruch Gouty on 04/30/2019 11:14:06 -------------------------------------------------------------------------------- Lower Extremity Assessment Details Patient Name: Date of Service: YANZIEL, BRAUNSTEIN. 04/30/2019 9:45 AM Medical Record [id number] Patient Account Number: [id number] Date of Birth/Sex: Treating RN: Aug 03, 1965 (54 y.o. Hessie Diener Primary Care Harlan Ervine: Hayden Rasmussen Other Clinician: Referring Zitlali Primm: Treating Tekeshia Klahr/Extender:Stone III,  Ileene Musa, Penelope Galas in Treatment: 2 Edema Assessment Assessed: [Left: No] [Right: Yes] Edema: [Left: N] [Right: o] Calf Left: Right: Point of Measurement: cm From Medial Instep cm 36 cm Ankle Left: Right: Point of Measurement: cm From Medial Instep cm 20.5 cm Electronic Signature(s) Signed: 04/30/2019 5:44:15 PM By: Deon Pilling Entered By: Deon Pilling on 04/30/2019 10:49:14 -------------------------------------------------------------------------------- Multi-Disciplinary Care Plan Details Patient Name: Date of Service: ROURKE, KASE. 04/30/2019 9:45 AM Medical Record [id number] Patient Account Number: [id number] Date of Birth/Sex: Treating RN: 06-28-1965 (54 y.o. Ernestene Mention Primary Care Jahir Halt: Hayden Rasmussen Other Clinician: Referring Anagabriela Jokerst: Treating Jermiya Reichl/Extender:Stone III, Ileene Musa, Penelope Galas in Treatment: 2 Active Inactive Electronic Signature(s) Signed: 05/02/2019 6:14:01 PM By: Baruch Gouty RN, BSN Entered By: Baruch Gouty on 04/30/2019 11:12:29 -------------------------------------------------------------------------------- Pain Assessment Details Patient Name: Date of Service: SHEPHERD, DOUBLEDAY. 04/30/2019 9:45 AM Medical Record [id number] Patient Account Number: [id number] Date of Birth/Sex: Treating RN: 03/29/1965 (54 y.o. Ernestene Mention Primary Care Ashante Snelling: Hayden Rasmussen Other Clinician: Referring Quindell Shere: Treating Deztinee Lohmeyer/Extender:Stone III, Ileene Musa, Penelope Galas in Treatment: 2 Active Problems Location of Pain Severity and Description of Pain Patient Has Paino Yes Site Locations Rate the pain. Current Pain Level: 7 Pain Management and Medication Current Pain Management: Electronic Signature(s) Signed: 05/02/2019 6:14:01 PM By: Baruch Gouty RN, BSN Signed: 06/25/2019 9:23:26 AM By: Sandre Kitty Entered By: Sandre Kitty on 04/30/2019  10:22:52 -------------------------------------------------------------------------------- Patient/Caregiver Education Details Patient Name: Date of Service: KELDRICK, TIFFANY 2/17/2021andnbsp9:45 AM Medical Record [id number] Patient Account Number: [id number] Date of Birth/Gender: Treating RN: 1965-07-06 (54 y.o. Ernestene Mention Primary Care Physician: Hayden Rasmussen Other Clinician: Referring Physician: Treating Physician/Extender:Stone III, Ileene Musa, Penelope Galas in Treatment: 2 Education Assessment Education Provided To: Patient Education  Topics Provided Wound/Skin Impairment: Methods: Explain/Verbal Responses: Reinforcements needed, State content correctly Electronic Signature(s) Signed: 05/02/2019 6:14:01 PM By: Baruch Gouty RN, BSN Entered By: Baruch Gouty on 04/30/2019 11:12:42 -------------------------------------------------------------------------------- Wound Assessment Details Patient Name: Date of Service: ARYE, ESPEJO. 04/30/2019 9:45 AM Medical Record [id number] Patient Account Number: [id number] Date of Birth/Sex: Treating RN: [birth year] (54 y.o. Ernestene Mention Primary Care Lurleen Soltero: Hayden Rasmussen Other Clinician: Referring Justo Hengel: Treating Cathey Fredenburg/Extender:Stone III, Ileene Musa, Penelope Galas in Treatment: 2 Wound Status Wound Number: 1 Primary Diabetic Wound/Ulcer of the Lower Extremity Etiology: Wound Location: Right Lower Leg - Posterior Wound Healed - Epithelialized Wounding Event: Trauma Status: Date Acquired: 03/14/2019 Comorbid Anemia, Chronic Obstructive Pulmonary Weeks Of Treatment: 2 History: Disease (COPD), Coronary Artery Disease, Clustered Wound: No Hypertension, Type II Diabetes, Rheumatoid Arthritis, Neuropathy, Seizure Disorder, Received Chemotherapy Photos Wound Measurements Length: (cm) 0 % Reduction Width: (cm) 0 % Reduction Depth: (cm) 0 Epitheliali Area: (cm) 0 Volume: (cm) 0 Wound  Description Classification: Grade 2 Foul Odor Wound Margin: Flat and Intact Slough/Fib Exudate Amount: Medium Exudate Type: Serosanguineous Exudate Color: red, brown Wound Bed Granulation Amount: Large (67-100%) Granulation Quality: Red, Pink Fascia Expo Necrotic Amount: Small (1-33%) Fat Layer ( Necrotic Quality: Adherent Slough Tendon Expo Muscle Expo Joint Expos Bone Expose After Cleansing: No rino Yes Exposed Structure sed: No Subcutaneous Tissue) Exposed: Yes sed: No sed: No ed: No d: No in Area: 100% in Volume: 100% zation: Large (67-100%) Electronic Signature(s) Signed: 04/30/2019 4:27:12 PM By: Mikeal Hawthorne EMT/HBOT Signed: 05/02/2019 6:14:01 PM By: Baruch Gouty RN, BSN Entered By: Mikeal Hawthorne on 04/30/2019 15:16:42 -------------------------------------------------------------------------------- Vitals Details Patient Name: Date of Service: SAMIN, HADSELL. 04/30/2019 9:45 AM Medical Record :[id number] Patient Account Number: [id number] Date of Birth/Sex: Treating RN: [birth year] (54 y.o. Ernestene Mention Primary Care Nina Mondor: Hayden Rasmussen Other Clinician: Referring Rhone Ozaki: Treating [place] Franson/Extender:Stone III, Ileene Musa, Penelope Galas in Treatment: 2 Vital Signs Time Taken: 10:22 Temperature (F): 98.1 Height (in): 72 Pulse (bpm): 96 Weight (lbs): 217 Respiratory Rate (breaths/min): 18 Body Mass Index (BMI): 29.4 Blood Pressure (mmHg): 151/68 Capillary Blood Glucose (mg/dl): 128 Reference Range: 80 - 120 mg / dl Electronic Signature(s) Signed: 06/25/2019 9:23:26 AM By: Sandre Kitty Entered By: Sandre Kitty on 04/30/2019 10:22:32

## 2019-06-25 NOTE — Progress Notes (Signed)
Vincent [name], Vincent [name] ([id number]) Visit Report for 04/23/2019 Arrival Information Details Patient Name: Date of Service: Vincent [name], Vincent [name]. 04/23/2019 10:00 AM Medical Record [place]:[id number] Patient Account Number: [id number] Date of Birth/Sex: Treating RN: 07/26/1965 (54 y.o. Vincent [name]: Vincent [name] Other Clinician: Referring Audria Takeshita: Treating Vincent [name]/Extender:Vincent [name], Vincent [name], Vincent [name] in Treatment: 1 Visit Information History Since Last Visit Added or deleted any medications: No Patient Arrived: Vincent [name] Any new allergies or adverse reactions: No Arrival Time: 10:14 Had a fall or experienced change in Yes Accompanied By: wife activities of daily living that may affect Transfer Assistance: None risk of falls: Patient Identification Verified: Yes Signs or symptoms of abuse/neglect since last No Secondary Verification Process Yes visito Completed: Hospitalized since last visit: No Patient Requires Transmission-Based No Implantable device outside of the clinic excluding No Precautions: cellular tissue based products placed in the center Patient Has Alerts: Yes since last visit: Patient Alerts: R ABI 1.25 TBI Has Dressing in Place as Prescribed: Yes 0.94 Pain Present Now: Yes L ABI 1.28 TBI 1.28 Notes Patient stated he had a fall yesterday going up the steps did not hurt his self Electronic Signature(s) Signed: 06/25/2019 9:23:26 AM By: Vincent [name] Entered By: Vincent [name] 10:14:55 -------------------------------------------------------------------------------- Clinic Level of Care Assessment Details Patient Name: Date of Service: Vincent [name], Vincent [name]. 04/23/2019 10:00 AM Medical Record [place]:[id number] Patient Account Number: [id number] Date of Birth/Sex: Treating RN: [birth year] (54 y.o. Vincent [name] Primary Care Vincent [name]: Vincent [name] Other Clinician: Referring Vincent [name]: Treating  Vincent [name]/Extender:Vincent [name], Vincent [name], Vincent [name] in Treatment: 1 Clinic Level of Care Assessment Items TOOL 4 Quantity Score []  - Use when only an EandM is performed on FOLLOW-UP visit 0 ASSESSMENTS - Nursing Assessment / Reassessment X - Reassessment of Co-morbidities (includes updates in patient status) 1 10 X - Reassessment of Adherence to Treatment Plan 1 5 ASSESSMENTS - Wound and Skin Assessment / Reassessment X - Simple Wound Assessment / Reassessment - one wound 1 5 []  - Complex Wound Assessment / Reassessment - multiple wounds 0 []  - Dermatologic / Skin Assessment (not related to wound area) 0 ASSESSMENTS - Focused Assessment []  - Circumferential Edema Measurements - multi extremities 0 []  - Nutritional Assessment / Counseling / Intervention 0 X - Lower Extremity Assessment (monofilament, tuning fork, pulses) 1 5 []  - Peripheral Arterial Disease Assessment (using hand held doppler) 0 ASSESSMENTS - Ostomy and/or Continence Assessment and Care []  - Incontinence Assessment and Management 0 []  - Ostomy Care Assessment and Management (repouching, etc.) 0 PROCESS - Coordination of Care []  - Simple Patient / Family Education for ongoing care 0 []  - Complex (extensive) Patient / Family Education for ongoing care 0 X - Staff obtains Programmer, systems, Records, Test Results / Process Orders 1 10 X - Staff telephones HHA, Nursing Homes / Clarify orders / etc 1 10 []  - Routine Transfer to another Facility (non-emergent condition) 0 []  - Routine Hospital Admission (non-emergent condition) 0 []  - New Admissions / Biomedical engineer / Ordering NPWT, Apligraf, etc. 0 []  - Emergency Hospital Admission (emergent condition) 0 X - Simple Discharge Coordination 1 10 []  - Complex (extensive) Discharge Coordination 0 PROCESS - Special Needs []  - Pediatric / Minor Patient Management 0 []  - Isolation Patient Management 0 []  - Hearing / Language / Visual special needs 0 []  - Assessment of  Community assistance (transportation, D/C planning, etc.) 0 []  - Additional assistance / Altered mentation 0 []  - Support Surface(s) Assessment (bed,  cushion, seat, etc.) 0 INTERVENTIONS - Wound Cleansing / Measurement X - Simple Wound Cleansing - one wound 1 5 []  - Complex Wound Cleansing - multiple wounds 0 X - Wound Imaging (photographs - any number of wounds) 1 5 []  - Wound Tracing (instead of photographs) 0 X - Simple Wound Measurement - one wound 1 5 []  - Complex Wound Measurement - multiple wounds 0 INTERVENTIONS - Wound Dressings X - Small Wound Dressing one or multiple wounds 1 10 []  - Medium Wound Dressing one or multiple wounds 0 []  - Large Wound Dressing one or multiple wounds 0 X - Application of Medications - topical 1 5 []  - Application of Medications - injection 0 INTERVENTIONS - Miscellaneous []  - External ear exam 0 []  - Specimen Collection (cultures, biopsies, blood, body fluids, etc.) 0 []  - Specimen(s) / Culture(s) sent or taken to Lab for analysis 0 []  - Patient Transfer (multiple staff / Civil Service fast streamer / Similar devices) 0 []  - Simple Staple / Suture removal (25 or less) 0 []  - Complex Staple / Suture removal (26 or more) 0 []  - Hypo / Hyperglycemic Management (close monitor of Blood Glucose) 0 []  - Ankle / Brachial Index (ABI) - do not check if billed separately 0 X - Vital Signs 1 5 Has the patient been seen at the hospital within the last three years: Yes Total Score: 90 Level Of Care: New/Established - Level 3 Electronic Signature(s) Signed: 04/23/2019 5:39:41 PM By: Vincent [name] Entered By: Vincent Gouty on [name] 10:56:45 -------------------------------------------------------------------------------- Encounter Discharge Information Details Patient Name: Date of Service: Vincent [name], Vincent [name]. 04/23/2019 10:00 AM Medical Record [id number] Patient Account Number: [id number] Date of Birth/Sex: Treating RN: Aug 09, 1965 (54 y.o. Vincent [name] Primary Care Vincent [name]: Vincent [name] Other Clinician: Referring Vincent [name]: Treating Vincent Gatliff/Extender:Vincent [name], Vincent [name], Vincent [name] in Treatment: 1 Encounter Discharge Information Items Discharge Condition: Stable Ambulatory Status: Walker Discharge Destination: Home Transportation: Private Auto Accompanied By: wife Schedule Follow-up Appointment: Yes Clinical Summary of Care: Patient Declined Electronic Signature(s) Signed: 04/28/2019 6:14:31 PM By: Levan Hurst RN, [name] Entered By: Levan Hurst on [name] 17:04:10 -------------------------------------------------------------------------------- Lower Extremity Assessment Details Patient Name: Date of Service: Vincent [name], Vincent [name]. 04/23/2019 10:00 AM Medical Record [place]:[id number] Patient Account Number: [id number] Date of Birth/Sex: Treating RN: 15-Aug-1965 (54 y.o. Hessie Diener Primary Care Tisha Cline: Vincent [name] Other Clinician: Referring Laurian Edrington: Treating Sheneika Walstad/Extender:Vincent [name], Vincent [name], Vincent [name] in Treatment: 1 Edema Assessment Assessed: [Left: No] [Right: No] Edema: [Left: N] [Right: o] Calf Left: Right: Point of Measurement: cm From Medial Instep cm 35 cm Ankle Left: Right: Point of Measurement: cm From Medial Instep cm 21.5 cm Electronic Signature(s) Signed: 04/23/2019 5:06:10 PM By: Deon Pilling Entered By: Deon Pilling on [name] 10:22:13 -------------------------------------------------------------------------------- Multi-Disciplinary Care Plan Details Patient Name: Date of Service: Vincent [name], Vincent [name]. 04/23/2019 10:00 AM Medical Record [id number]:[id number] Patient Account Number: [id number] Date of Birth/Sex: Treating RN: 05/19/1965 (54 y.o. Vincent [name] Primary Care Qamar Aughenbaugh: Vincent [name] Other Clinician: Referring Breta Demedeiros: Treating Yvanna Vidas/Extender:Vincent [name], Vincent [name], Vincent [name] in Treatment: 1 Active Inactive Abuse / Safety / Falls / Self  Care Management Nursing Diagnoses: Potential for falls Goals: Patient/caregiver will verbalize/demonstrate measures taken to prevent injury and/or falls Date Initiated: 04/16/2019 Target Resolution Date: 05/14/2019 Goal Status: Active Interventions: Assess fall risk on admission and as needed Assess personal safety and home safety (as indicated) on admission and as needed Notes: Nutrition Nursing Diagnoses: Impaired glucose control: actual or potential  Goals: Patient/caregiver will maintain therapeutic glucose control Date Initiated: 04/16/2019 Target Resolution Date: 05/14/2019 Goal Status: Active Interventions: Assess HgA1c results as ordered upon admission and as needed Assess patient nutrition upon admission and as needed per policy Treatment Activities: Patient referred to Primary Care Physician for further nutritional evaluation : 04/16/2019 Notes: Wound/Skin Impairment Nursing Diagnoses: Impaired tissue integrity Knowledge deficit related to smoking impact on wound healing Knowledge deficit related to ulceration/compromised skin integrity Goals: Patient will demonstrate a reduced rate of smoking or cessation of smoking Date Initiated: 04/16/2019 Target Resolution Date: 05/14/2019 Goal Status: Active Patient/caregiver will verbalize understanding of skin care regimen Date Initiated: 04/16/2019 Target Resolution Date: 05/14/2019 Goal Status: Active Ulcer/skin breakdown will have a volume reduction of 30% by week 4 Date Initiated: 04/16/2019 Target Resolution Date: 05/14/2019 Goal Status: Active Interventions: Assess patient/caregiver ability to obtain necessary supplies Assess patient/caregiver ability to perform ulcer/skin care regimen upon admission and as needed Assess ulceration(s) every visit Provide education on smoking Provide education on ulcer and skin care Treatment Activities: Skin care regimen initiated : 04/16/2019 Topical wound management initiated :  04/16/2019 Notes: Electronic Signature(s) Signed: 04/23/2019 5:39:41 PM By: Vincent [name] Entered By: Vincent Gouty on [name] 10:55:58 -------------------------------------------------------------------------------- Pain Assessment Details Patient Name: Date of Service: Vincent [name], Vincent [name]. 04/23/2019 10:00 AM Medical Record [id number] Patient Account Number: [id number] Date of Birth/Sex: Treating RN: Dec 05, 1965 (54 y.o. Vincent [name] Primary Care Rejoice Heatwole: Vincent [name] Other Clinician: Referring Brenn Gatton: Treating Kaden Dunkel/Extender:Vincent [name], Vincent [name], Vincent [name] in Treatment: 1 Active Problems Location of Pain Severity and Description of Pain Patient Has Paino Yes Site Locations Rate the pain. Current Pain Level: 6 Pain Management and Medication Current Pain Management: Electronic Signature(s) Signed: 04/23/2019 5:39:41 PM By: Vincent [name] Signed: 06/25/2019 9:23:26 AM By: Vincent [name] Entered By: Vincent [name] 10:17:24 -------------------------------------------------------------------------------- Patient/Caregiver Education Details Patient Name: Date of Service: Vincent [name], Vincent [name] 2/10/2021andnbsp10:00 AM Medical Record [id number] Patient Account Number: [id number] Date of Birth/Gender: Treating RN: [birth year] (54 y.o. Vincent [name] Primary Care Physician: Vincent [name] Other Clinician: Referring Physician: Treating Physician/Extender:Vincent [name], Vincent [name], Vincent [name] in Treatment: 1 Education Assessment Education Provided To: Patient Education Topics Provided Wound/Skin Impairment: Methods: Explain/Verbal Responses: Reinforcements needed, State content correctly Electronic Signature(s) Signed: 04/23/2019 5:39:41 PM By: Vincent [name] Entered By: Vincent Gouty on [name] 10:56:14 -------------------------------------------------------------------------------- Wound  Assessment Details Patient Name: Date of Service: Vincent [name], Vincent [name]. 04/23/2019 10:00 AM Medical Record [id number] Patient Account Number: [id number] Date of Birth/Sex: Treating RN: 04-Oct-1965 (54 y.o. Vincent [name] Primary Care Corlis Angelica: Vincent [name] Other Clinician: Referring Salina Stanfield: Treating Deveon Kisiel/Extender:Vincent [name], Vincent [name], Vincent [name] in Treatment: 1 Wound Status Wound Number: 1 Primary Diabetic Wound/Ulcer of the Lower Extremity Etiology: Wound Location: Right Lower Leg - Posterior Wound Open Wounding Event: Trauma Status: Date Acquired: 03/14/2019 Comorbid Anemia, Chronic Obstructive Pulmonary Weeks Of Treatment: 1 History: Disease (COPD), Coronary Artery Disease, Clustered Wound: No Hypertension, Type II Diabetes, Rheumatoid Arthritis, Neuropathy, Seizure Disorder, Received Chemotherapy Photos Wound Measurements Length: (cm) 0.5 % Reduction in Width: (cm) 0.3 % Reduction in Depth: (cm) 0.1 Epithelializat Area: (cm) 0.118 Tunneling: Volume: (cm) 0.012 Undermining: Wound Description Classification: Grade 2 Foul Odor Aft Wound Margin: Flat and Intact Slough/Fibrin Exudate Amount: Medium Exudate Type: Serosanguineous Exudate Color: red, brown Wound Bed Granulation Amount: Large (67-100%) Granulation Quality: Red, Pink Fascia Exposed Necrotic Amount: Small (1-33%) Fat Layer (Sub Necrotic Quality: Adherent Slough Tendon Exposed Muscle Exposed Joint  Exposed: Bone Exposed: Electronic Signature(s) Signed: 04/24/2019 4:30:47 PM By: Mikeal Hawthorne EMT/HBOT Signed: 04/25/2019 5:30:09 PM By: Vincent [name] Previous Signature: 04/23/2019 5:06:10 PM Version By: Lady Deutscher Previous Signature: 04/23/2019 5:39:41 PM Version By: Alla German Entered By: Mikeal Hawthorne on 02/11/ er Cleansing: No o Yes Exposed Structure : No cutaneous Tissue) Exposed: Yes : No : No No No i nda RN, [name] 2021 11:28:54 Area: 99.3% Volume: 99.3% ion:  Large (67-100%) No No -------------------------------------------------------------------------------- Vitals Details Patient Name: Date of Service: Vincent [name], Vincent [name]. 04/23/2019 10:00 AM Medical Record [id number] Patient Account Number: [id number] Date of Birth/Sex: Treating RN: [birth year] (54 y.o. Vincent [name] Primary Care Menashe Kafer: Vincent [name] Other Clinician: Referring Brendaly Townsel: Treating Malaky Tetrault/Extender:Vincent [name], Vincent [name], Vincent [name] in Treatment: 1 Vital Signs Time Taken: 10:14 Temperature (F): 98.2 Height (in): 72 Pulse (bpm): 82 Weight (lbs): 217 Respiratory Rate (breaths/min): 18 Body Mass Index (BMI): 29.4 Blood Pressure (mmHg): 115/63 Capillary Blood Glucose (mg/dl): 196 Reference Range: 80 - 120 mg / dl Electronic Signature(s) Signed: 06/25/2019 9:23:26 AM By: Vincent [name] Entered By: Vincent [name] 10:17:06

## 2019-06-26 ENCOUNTER — Telehealth: Payer: Self-pay | Admitting: *Deleted

## 2019-06-26 ENCOUNTER — Other Ambulatory Visit: Payer: Self-pay

## 2019-06-26 ENCOUNTER — Ambulatory Visit (AMBULATORY_SURGERY_CENTER): Payer: Self-pay | Admitting: *Deleted

## 2019-06-26 VITALS — Temp 96.8°F | Ht 72.0 in | Wt 214.0 lb

## 2019-06-26 DIAGNOSIS — R1032 Left lower quadrant pain: Secondary | ICD-10-CM

## 2019-06-26 DIAGNOSIS — Z01818 Encounter for other preprocedural examination: Secondary | ICD-10-CM

## 2019-06-26 DIAGNOSIS — R197 Diarrhea, unspecified: Secondary | ICD-10-CM

## 2019-06-26 MED ORDER — SUPREP BOWEL PREP KIT 17.5-3.13-1.6 GM/177ML PO SOLN: ORAL | 0 refills | Status: DC

## 2019-06-26 NOTE — Telephone Encounter (Signed)
Dr. Henrene Pastor and Jenny Reichmann,  I saw this pt for his PV today- his ECL is 07-11-19.  He has multiple medical issues, including a seizure hx.  He told me his last seizure was yesterday.  He and his wife both state, "they are happening pretty often."  He states he "just trembles uncontrollably during these times.  I am aware of my surroundings, just tremble and then have to sleep it off."  Just making sure if he ok for [place]   Thanks, [employer]

## 2019-06-26 NOTE — Progress Notes (Signed)
Last seizure-yesterday.  Per pt and family- "seizures have been regular lately."  Note sent to Dr. Henrene Pastor and Osvaldo Angst CRNA  covid test 07-08-19 at 2:00 pm  Pt is aware that care partner will wait in the car during procedure; if they feel like they will be too hot or cold to wait in the car; they may wait in the 4 th floor lobby. Patient is aware to bring only one care partner. We want them to wear a mask (we do not have any that we can provide them), practice social distancing, and we will check their temperatures when they get here.  I did remind the patient that their care partner needs to stay in the parking lot the entire time and have a cell phone available, we will call them when the pt is ready for discharge. Patient will wear mask into building.   No egg or soy allergy  No home oxygen use   No medications for weight loss taken  emmi information given  Pt states he has some constipation issues- 2 day Dr. Henrene Pastor prep used.   No trouble with anesthesia, difficulty with intubation or hx/fam hx of malignant hyperthermia per pt

## 2019-06-27 NOTE — Telephone Encounter (Signed)
[place] for [employer]. Glad to be aware of this. Thanks

## 2019-06-28 NOTE — Telephone Encounter (Signed)
Vincent [name] and [place],  This pt is cleared for anesthetic care at [facility].   Thanks,  Osvaldo Angst

## 2019-06-30 NOTE — Telephone Encounter (Signed)
Noted. Thanks.

## 2019-07-07 DIAGNOSIS — F4310 Post-traumatic stress disorder, unspecified: Secondary | ICD-10-CM | POA: Diagnosis not present

## 2019-07-07 DIAGNOSIS — M5136 Other intervertebral disc degeneration, lumbar region: Secondary | ICD-10-CM | POA: Diagnosis not present

## 2019-07-07 DIAGNOSIS — G894 Chronic pain syndrome: Secondary | ICD-10-CM | POA: Diagnosis not present

## 2019-07-07 DIAGNOSIS — M47816 Spondylosis without myelopathy or radiculopathy, lumbar region: Secondary | ICD-10-CM | POA: Diagnosis not present

## 2019-07-08 DIAGNOSIS — N509 Disorder of male genital organs, unspecified: Secondary | ICD-10-CM | POA: Diagnosis not present

## 2019-07-08 DIAGNOSIS — K4020 Bilateral inguinal hernia, without obstruction or gangrene, not specified as recurrent: Secondary | ICD-10-CM | POA: Diagnosis not present

## 2019-07-08 DIAGNOSIS — N400 Enlarged prostate without lower urinary tract symptoms: Secondary | ICD-10-CM | POA: Diagnosis not present

## 2019-07-08 DIAGNOSIS — N200 Calculus of kidney: Secondary | ICD-10-CM | POA: Diagnosis not present

## 2019-07-08 DIAGNOSIS — Z9049 Acquired absence of other specified parts of digestive tract: Secondary | ICD-10-CM | POA: Diagnosis not present

## 2019-07-10 ENCOUNTER — Telehealth: Payer: Self-pay

## 2019-07-10 NOTE — Telephone Encounter (Signed)
Patient is scheduled to see Dr. Henrene Pastor on 07/11/19 at 2pm. Patient no showed his covid test that was scheduled on 07/08/19. I was unable to reach patient to verify if he went to a different location or if he completed the covid vaccines. I left a voicemail to have pt call office back.

## 2019-07-11 ENCOUNTER — Encounter: Payer: No Typology Code available for payment source | Admitting: Internal Medicine

## 2019-07-11 ENCOUNTER — Other Ambulatory Visit: Payer: Self-pay | Admitting: Family Medicine

## 2019-07-14 ENCOUNTER — Ambulatory Visit: Payer: Medicare Other | Admitting: Endocrinology

## 2019-07-14 DIAGNOSIS — Z0289 Encounter for other administrative examinations: Secondary | ICD-10-CM

## 2019-07-23 ENCOUNTER — Encounter (HOSPITAL_COMMUNITY): Payer: Self-pay | Admitting: Psychology

## 2019-07-23 ENCOUNTER — Ambulatory Visit: Payer: No Typology Code available for payment source | Admitting: Endocrinology

## 2019-07-23 DIAGNOSIS — E1165 Type 2 diabetes mellitus with hyperglycemia: Secondary | ICD-10-CM

## 2019-07-23 DIAGNOSIS — Z0289 Encounter for other administrative examinations: Secondary | ICD-10-CM

## 2019-07-23 NOTE — Progress Notes (Signed)
Vincent [name] is a 54 y.o. male patient who is discharged from counseling as last seen over 90 days ago.        [name], [facility]

## 2019-07-29 ENCOUNTER — Encounter (HOSPITAL_COMMUNITY): Payer: Self-pay | Admitting: Psychiatry

## 2019-07-29 ENCOUNTER — Other Ambulatory Visit: Payer: Self-pay

## 2019-07-29 ENCOUNTER — Telehealth (INDEPENDENT_AMBULATORY_CARE_PROVIDER_SITE_OTHER): Payer: No Typology Code available for payment source | Admitting: Psychiatry

## 2019-07-29 DIAGNOSIS — F4312 Post-traumatic stress disorder, chronic: Secondary | ICD-10-CM

## 2019-07-29 DIAGNOSIS — F3181 Bipolar II disorder: Secondary | ICD-10-CM

## 2019-07-29 MED ORDER — VENLAFAXINE HCL ER 150 MG PO CP24: 150.0000 mg | ORAL_CAPSULE | Freq: Every day | ORAL | 2 refills | Status: DC

## 2019-07-29 MED ORDER — ARIPIPRAZOLE 5 MG PO TABS: 5.0000 mg | ORAL_TABLET | Freq: Every day | ORAL | 2 refills | Status: DC

## 2019-07-29 MED ORDER — MIRTAZAPINE 30 MG PO TABS: 30.0000 mg | ORAL_TABLET | Freq: Every day | ORAL | 2 refills | Status: DC

## 2019-07-29 NOTE — Progress Notes (Signed)
Virtual Visit via Telephone Note  I connected with Vincent [name] on 07/29/19 at 10:40 AM EDT by telephone and verified that I am speaking with the correct person using two identifiers.   I discussed the limitations, risks, security and privacy concerns of performing an evaluation and management service by telephone and the availability of in person appointments. I also discussed with the patient that there may be a patient responsible charge related to this service. The patient expressed understanding and agreed to proceed.   History of Present Illness: Patient is evaluated by phone session.  Currently he is vacationing in [place] with his wife.  He is doing well on his medication but sometimes he complains of insomnia.  His nightmares and flashbacks are not as intense.  He recently had work-up for GI as he was complaining of persistent diarrhea.  He also saw neurology.  Denies any highs and lows, anger, irritability or any mania.  He denies any crying spells.  His hemoglobin A1c is dropped further to 7.2.  He has numbness tingling and difficulty walking and takes gabapentin.  His pain medicines were changed from fentanyl patch to morphine and that he feels helps a lot.  He had a good support from his family.  He is prescribed Lamictal from neurology for his seizures.  His last seizure was 2 months ago.  He feels Abilify, mirtazapine and Effexor helping his mood and PTSD symptoms.   Past Psychiatric History:Reviewed. H/Oone brief hospitalization at [facility] due to suicidal thoughts. No h/osuicidal attempt. H/Oanger, anxiety, PTSD and heavy substance use. 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 Tried Depakote, Zoloft, lithium, Prozac and Xanax.   Recent Results (from the past 2160 hour(s))  CBC w/Diff     Status: None   Collection Time: 06/05/19 11:02 AM  Result Value Ref Range   WBC 9.2 4.0 - 10.5 K/uL   RBC 4.81 4.22 - 5.81 Mil/uL   Hemoglobin 14.2 13.0 - 17.0 g/dL   HCT  40.5 39.0 - 52.0 %   MCV 84.3 78.0 - 100.0 fl   MCHC 35.0 30.0 - 36.0 g/dL   RDW 13.1 11.5 - 15.5 %   Platelets 303.0 150.0 - 400.0 K/uL   Neutrophils Relative % 71.6 43.0 - 77.0 %   Lymphocytes Relative 19.9 12.0 - 46.0 %   Monocytes Relative 7.2 3.0 - 12.0 %   Eosinophils Relative 0.6 0.0 - 5.0 %   Basophils Relative 0.7 0.0 - 3.0 %   Neutro Abs 6.6 1.4 - 7.7 K/uL   Lymphs Abs 1.8 0.7 - 4.0 K/uL   Monocytes Absolute 0.7 0.1 - 1.0 K/uL   Eosinophils Absolute 0.1 0.0 - 0.7 K/uL   Basophils Absolute 0.1 0.0 - 0.1 K/uL  Comprehensive metabolic panel     Status: Abnormal   Collection Time: 06/05/19 11:02 AM  Result Value Ref Range   Sodium 137 135 - 145 mEq/L   Potassium 4.6 3.5 - 5.1 mEq/L   Chloride 102 96 - 112 mEq/L   CO2 27 19 - 32 mEq/L   Glucose, Bld 193 (H) 70 - 99 mg/dL   BUN 10 6 - 23 mg/dL   Creatinine, Ser 0.90 0.40 - 1.50 mg/dL   Total Bilirubin 0.8 0.2 - 1.2 mg/dL   Alkaline Phosphatase 104 39 - 117 U/L   AST 72 (H) 0 - 37 U/L   ALT 71 (H) 0 - 53 U/L   Total Protein 7.2 6.0 - 8.3 g/dL  Albumin 4.5 3.5 - 5.2 g/dL   GFR 88.62 >60.00 mL/min   Calcium 9.7 8.4 - 10.5 mg/dL  GI Profile, Stool, PCR     Status: None   Collection Time: 06/10/19  8:26 AM  Result Value Ref Range   Campylobacter Not Detected Not Detected   C difficile toxin A/B Not Detected Not Detected   Plesiomonas shigelloides Not Detected Not Detected   Salmonella Not Detected Not Detected   Vibrio Not Detected Not Detected   Vibrio cholerae Not Detected Not Detected   Yersinia enterocolitica Not Detected Not Detected   Enteroaggregative E coli Not Detected Not Detected   Enteropathogenic E coli Not Detected Not Detected   Enterotoxigenic E coli Not Detected Not Detected   Shiga-toxin-producing E coli Not Detected Not Detected   E coli O157 Not Detected Not Detected   Shigella/Enteroinvasive E coli Not Detected Not Detected   Cryptosporidium Not Detected Not Detected   Cyclospora cayetanensis Not  Detected Not Detected   Entamoeba histolytica Not Detected Not Detected   Giardia lamblia Not Detected Not Detected   Adenovirus F 40/41 Not Detected Not Detected   Astrovirus Not Detected Not Detected   Norovirus GI/GII Not Detected Not Detected   Rotavirus A Not Detected Not Detected   Sapovirus Not Detected Not Detected  Ova and parasite examination     Status: None   Collection Time: 06/10/19  8:26 AM   Specimen: Stool  Result Value Ref Range   MICRO NUMBER: [id number]    SPECIMEN QUALITY: Adequate    Source STOOL    STATUS: FINAL    CONCENTRATE RESULT: No ova or parasites seen    TRICHROME RESULT: No ova or parasites seen    COMMENT:      Routine Ova and Parasite exam may not detect some parasites that occasionally cause diarrheal illness. Test code(s) [id number] (Cryptosporidium Ag., DFA) and/or 10018 (Cyclospora and Isospora Exam) may be ordered to detect these parasites. One negative sample  does not necessarily rule out the presence of a parasitic infection.  For additional information, please refer to https://education.questdiagnostics.com/faq/FAQ203 (This link is being provided for informational/ educational purposes only.)   Stool C-Diff Toxin Assay     Status: None   Collection Time: 06/10/19  8:26 AM   Specimen: Stool   STOOL  Result Value Ref Range   C difficile Toxins A+B, EIA Negative Negative  Stool, WBC/Lactoferrin     Status: None   Collection Time: 06/10/19  8:26 AM  Result Value Ref Range   MICRO NUMBER: [id number]    SPECIMEN QUALITY: Adequate    Source STOOL    STATUS: FINAL    Fecal Lactoferrin Negative    COMMENT:      Lactoferrin in the stool is a marker for fecal leukocytes and is a non-specific indicator of intestinal inflammation that may be detected in patients with acute infectious colitis or inflammatory bowel disease. The diagnosis of an acute infectious  process or active IBD cannot be established solely on the basis of a positive result. This test may  not be appropriate for immunocompromised persons. In addition, this test is not FDA cleared for patients with a history of HIV and/or Hepatitis B and C,  patients with a history of infectious diarrhea (within 6 months), and patients having had a colostomy and/or ileostomy within 1 month.      Psychiatric Specialty Exam: Physical Exam  Review of Systems  There were no vitals taken for this visit.There is no height  or weight on file to calculate BMI.  General Appearance: NA  Eye Contact:  NA  Speech:  Slow  Volume:  Normal  Mood:  Euthymic  Affect:  NA  Thought Process:  Descriptions of Associations: Intact  Orientation:  Full (Time, Place, and Person)  Thought Content:  Logical  Suicidal Thoughts:  No  Homicidal Thoughts:  No  Memory:  Immediate;   Fair Recent;   Fair Remote;   Fair  Judgement:  Intact  Insight:  Present  Psychomotor Activity:  NA  Concentration:  Concentration: Fair and Attention Span: Fair  Recall:  [employer] of Knowledge:  Fair  Language:  Good  Akathisia:  No  Handed:  Right  AIMS (if indicated):     Assets:  Communication Skills Desire for Improvement Housing Social Support  ADL's:  Intact  Cognition:  WNL  Sleep:   fair      Assessment and Plan: PTSD.  Bipolar disorder type II.  I reviewed his blood work results and current medication.  His hemoglobin A1c is improved.  He has no tremors or shakes from Abilify.  I discussed that he is taking a lot of medication and his insomnia could be due to neuropathy as he feels tingling and numbness at night.  Overall his diabetes is improved.  I told that he should discuss with his PCP who is prescribing gabapentin to see if dose can be adjusted to help neuropathy and sleep.  He agreed to give him a call.  I will continue Abilify 5 mg daily, mirtazapine 30 mg at bedtime and Effexor XR 150 mg in the morning.  Is getting Lamictal from neurology for his seizures.  Discussed medication side effects and benefits.   Recommended to call us back if there is any question or any concerns.  Follow-up in 3 months.  Time spent 20 minutes.  Follow Up Instructions:    I discussed the assessment and treatment plan with the patient. The patient was provided an opportunity to ask questions and all were answered. The patient agreed with the plan and demonstrated an understanding of the instructions.   The patient was advised to call back or seek an in-person evaluation if the symptoms worsen or if the condition fails to improve as anticipated.  I provided 20 minutes of non-face-to-face time during this encounter.   Kathlee Nations, MD

## 2019-08-04 ENCOUNTER — Telehealth: Payer: Self-pay

## 2019-08-04 NOTE — Telephone Encounter (Signed)
Received notification from [facility] indicating PA is needed for Trulicity. LOV 06/02/19. Per Dr. Loanne Drilling, pt will need f/u appt in 6 weeks. Pt NS'd 5/3 and 5/12. Provided Dr. Loanne Drilling with this correspondence. Per Dr. Loanne Drilling, pt must schedule an appt before a PA can be initiated. Attempted to call pt to schedule an appt per Dr. Cordelia Pen orders. LVM requesting returned call.

## 2019-08-09 ENCOUNTER — Emergency Department (HOSPITAL_COMMUNITY)
Admission: EM | Admit: 2019-08-09 | Discharge: 2019-08-09 | Disposition: A | Discharge status: Home / Self Care | Payer: No Typology Code available for payment source | Attending: Emergency Medicine | Admitting: Emergency Medicine

## 2019-08-09 DIAGNOSIS — E119 Type 2 diabetes mellitus without complications: Secondary | ICD-10-CM | POA: Diagnosis not present

## 2019-08-09 DIAGNOSIS — I959 Hypotension, unspecified: Secondary | ICD-10-CM | POA: Diagnosis not present

## 2019-08-09 DIAGNOSIS — I2510 Atherosclerotic heart disease of native coronary artery without angina pectoris: Secondary | ICD-10-CM | POA: Insufficient documentation

## 2019-08-09 DIAGNOSIS — R42 Dizziness and giddiness: Secondary | ICD-10-CM | POA: Diagnosis not present

## 2019-08-09 DIAGNOSIS — I951 Orthostatic hypotension: Secondary | ICD-10-CM | POA: Diagnosis not present

## 2019-08-09 DIAGNOSIS — Z8551 Personal history of malignant neoplasm of bladder: Secondary | ICD-10-CM | POA: Diagnosis not present

## 2019-08-09 DIAGNOSIS — E1165 Type 2 diabetes mellitus with hyperglycemia: Secondary | ICD-10-CM | POA: Diagnosis not present

## 2019-08-09 DIAGNOSIS — J449 Chronic obstructive pulmonary disease, unspecified: Secondary | ICD-10-CM | POA: Insufficient documentation

## 2019-08-09 DIAGNOSIS — Z7984 Long term (current) use of oral hypoglycemic drugs: Secondary | ICD-10-CM | POA: Insufficient documentation

## 2019-08-09 DIAGNOSIS — Z79899 Other long term (current) drug therapy: Secondary | ICD-10-CM | POA: Diagnosis not present

## 2019-08-09 LAB — BASIC METABOLIC PANEL
Anion gap: 12 (ref 5–15)
BUN: 17 mg/dL (ref 6–20)
CO2: 22 mmol/L (ref 22–32)
Calcium: 8.9 mg/dL (ref 8.9–10.3)
Chloride: 105 mmol/L (ref 98–111)
Creatinine, Ser: 1.89 mg/dL — ABNORMAL HIGH (ref 0.61–1.24)
GFR calc Af Amer: 46 mL/min — ABNORMAL LOW (ref >60)
GFR calc non Af Amer: 40 mL/min — ABNORMAL LOW (ref >60)
Glucose, Bld: 313 mg/dL — ABNORMAL HIGH (ref 70–99)
Potassium: 4.5 mmol/L (ref 3.5–5.1)
Sodium: 139 mmol/L (ref 135–145)

## 2019-08-09 LAB — CBC WITH DIFFERENTIAL/PLATELET
Abs Immature Granulocytes: 0.11 10*3/uL — ABNORMAL HIGH (ref 0.00–0.07)
Basophils Absolute: 0.1 10*3/uL (ref 0.0–0.1)
Basophils Relative: 1 %
Eosinophils Absolute: 0.2 10*3/uL (ref 0.0–0.5)
Eosinophils Relative: 2 %
HCT: 37.5 % — ABNORMAL LOW (ref 39.0–52.0)
Hemoglobin: 12.8 g/dL — ABNORMAL LOW (ref 13.0–17.0)
Immature Granulocytes: 1 %
Lymphocytes Relative: 34 %
Lymphs Abs: 2.7 10*3/uL (ref 0.7–4.0)
MCH: 28.8 pg (ref 26.0–34.0)
MCHC: 34.1 g/dL (ref 30.0–36.0)
MCV: 84.5 fL (ref 80.0–100.0)
Monocytes Absolute: 0.6 10*3/uL (ref 0.1–1.0)
Monocytes Relative: 7 %
Neutro Abs: 4.4 10*3/uL (ref 1.7–7.7)
Neutrophils Relative %: 55 %
Platelets: 201 10*3/uL (ref 150–400)
RBC: 4.44 MIL/uL (ref 4.22–5.81)
RDW: 12 % (ref 11.5–15.5)
WBC: 8 10*3/uL (ref 4.0–10.5)
nRBC: 0 % (ref 0.0–0.2)

## 2019-08-09 LAB — MAGNESIUM: Magnesium: 1.6 mg/dL — ABNORMAL LOW (ref 1.7–2.4)

## 2019-08-09 MED ORDER — LACTATED RINGERS IV BOLUS
1000.0000 mL | Freq: Once | INTRAVENOUS | Status: AC
  Administered 2019-08-09: 1000 mL via INTRAVENOUS

## 2019-08-09 NOTE — ED Triage Notes (Signed)
Pt presents from home, experienced dizziness, shaking and slurred speech while pumping gas today, had blood pressure checked, was told to call 911 for hypotension. EMS arrived and found BP to be 60/40 with manual cuff, improved to 94/63 with 500cc NS. Dizzy, R arm numbness x1 mo but worse today, PO intake good. Daily morphine use x 3 mo, 60mg  2x daily.   Grip strength good, tremor present (normal per pt), CBG 416, no pain, HR 97.  H/o COPD, DM, seizures, 70% blockage of L coratid artery, TIA , Chronic pain

## 2019-08-09 NOTE — ED Provider Notes (Signed)
[facility] EMERGENCY DEPARTMENT Provider Note   CSN: [id number] Arrival date & time: 08/09/19  1142     History Chief Complaint  Patient presents with  . Hypotension    Vincent [name] is a 54 y.o. male.  HPI   54 year old male with dizziness/lightheadedness.  He reports that over the past month or so he has had symptoms with changes in position such as going from a laying or seated position to standing.  He will feel lightheaded.  He feels like his thought process is slowed and sometimes a speech will be a little bit slurred.  His symptoms will consistently improve if he sits back down.  Today he was pumping gas when symptoms are worse than they typically are.  Currently no symptoms while at rest.  EMS was called and his initial blood pressure was low at 60/40.  He was given 500 cc normal saline with some improvement.  Reports no recent medication changes aside from going from fentanyl to morphine but this was several months ago.  He states that he was previously on blood pressure medication but this was discontinued because of low readings.  He reports that he has been eating and drinking okay.  Past Medical History:  Diagnosis Date  . Anxiety   . Bilateral carpal tunnel syndrome   . Bipolar 1 disorder ([place])   . CAD (coronary artery disease)    Non obstructive on CTA Oct 2019.   Marland Kitchen Cancer [facility])    bladder cancer  . Chronic pain syndrome    back  . Cold extremities    BLE  . COPD (chronic obstructive pulmonary disease) ([place])   . Cubital tunnel syndrome on right   . DDD (degenerative disc disease), lumbar   . Diabetic peripheral neuropathy ([place])   . Gastroparesis   . GERD (gastroesophageal reflux disease)   . Hiatal hernia   . History of bladder cancer urologist-  dr Consuella Lose   papillay TCC (Ta G1)  s/p TURBT and chemo instillation 2014  . History of bronchitis   . History of carpal tunnel syndrome    Bilateral  . History of chest pain 12/2017  . History  of chest pain 12/2017   heart cath normal  . History of encephalopathy 05/27/2015   admission w/ acute encephalopathy thought to be secondary to pain meds and COPD  . History of gastric ulcer   . History of Helicobacter pylori infection   . History of kidney stones    not aware  . History of TIA (transient ischemic attack) 2008    no residual  . History of traumatic head injury 2010   w/ LOC  per pt needed stitches, hit in head with a mower blade  . Hyperlipidemia   . Hypertension   . Hypothyroidism   . Insomnia    per sleep study 04-19-2015 without sleep apnea  . Neuropathy in diabetes ([place])    LOWER EXTREMITIES  . PTSD (post-traumatic stress disorder)   . RA (rheumatoid arthritis) ([place])   . Seizures, transient [facility]) neurologist-  dr Krista Blue--  differential dx complex partial seizure .vs.  mood disorder .vs.  pseudoseizure--  negative EEG's   confusion episodes and staring spells since 11/ 2015  . Sleep apnea    Mild, NO CPAP ORDERED  . Transient confusion NEUOROLOGIST-  DR YAN   Episodes since 11/ 2015--  neurologist dx  differential complex partial seizure  .vs. mood disorder . vs. pseudoseizure  . Tremor   .  Type 2 diabetes mellitus treated with insulin [facility])     Patient Active Problem List   Diagnosis Date Noted  . Right leg weakness 04/24/2019  . Right leg pain 04/24/2019  . Gait abnormality 04/24/2019  . TIA (transient ischemic attack) 10/20/2018  . Uncontrolled type 2 DM with hyperosmolar nonketotic hyperglycemia ([place]) 10/19/2018  . Left sided numbness 10/19/2018  . Diabetic autonomic neuropathy associated with type 2 diabetes mellitus ([place]) 02/01/2018  . DM type 2 with diabetic peripheral neuropathy ([place]) 02/01/2018  . Dyslipidemia 01/31/2018  . Coronary artery disease involving native coronary artery of native heart without angina pectoris 01/31/2018  . Chest pain 01/01/2018  . Seizure disorder ([place]) 12/19/2016  . Essential tremor 12/19/2016  . Polypharmacy  12/19/2016  . Chronic post-traumatic stress disorder (PTSD) 06/16/2016  . Tremor 06/16/2016  . Tobacco use disorder 05/05/2016  . Psychophysiological insomnia 06/02/2015  . Altered mental status 05/28/2015  . Acute bronchitis 05/28/2015  . Type 2 diabetes mellitus with hyperglycemia ([place]) 05/28/2015  . Hypothyroidism 05/28/2015  . Bipolar 2 disorder ([place]) 05/28/2015  . History of rheumatoid arthritis 05/28/2015  . Chronic pain 05/28/2015  . Depression 03/18/2015  . Diabetes ([place]) 01/23/2015  . Obesity (BMI 30-39.9) 03/06/2014  . Acute encephalopathy 03/05/2014  . HLD (hyperlipidemia) 03/05/2014  . Anemia, normocytic normochromic 03/05/2014  . Altered mental state   . Protein-calorie malnutrition, severe ([place]) 10/09/2012  . Bladder tumor 08/08/2012  . Biliary dyskinesia 11/02/2010    Past Surgical History:  Procedure Laterality Date  . CARDIAC CATHETERIZATION  12-27-2001  DR Einar Gip  &  05-26-2009  DR Irish Lack   RESULTS FOR BOTH ARE NORMAL CORONARIES AND PERSERVED LVF/ EF 60%  . CARPAL TUNNEL RELEASE Right 09-16-2003  . CARPAL TUNNEL RELEASE Left 02/25/2015   Procedure: LEFT CARPAL TUNNEL RELEASE;  Surgeon: Leanora Cover, MD;  Location: [facility];  Service: Orthopedics;  Laterality: Left;  . COLONOSCOPY  2014  . CYSTOSCOPY N/A 10/10/2012   Procedure: CYSTOSCOPY CLOT EVACUATION FULGERATION OF BLEEDERS ;  Surgeon: Claybon Jabs, MD;  Location[facility];  Service: Urology;  Laterality: N/A;  . CYSTOSCOPY WITH BIOPSY N/A 11/26/2015   Procedure: CYSTOSCOPY WITH BIOPSY AND FULGURATION;  Surgeon: Kathie Rhodes, MD;  Location: [facility];  Service: Urology;  Laterality: N/A;  . ESOPHAGOGASTRODUODENOSCOPY  2014  . LAPAROSCOPIC CHOLECYSTECTOMY  11-17-2010  . NEGATIVE SLEEP STUDY  04-19-2015  in epic  . ORCHIECTOMY Right 02/21/2016   Procedure: SCROTAL ORCHIECTOMY with TESTICULAR PROSTHESIS IMPLANT;  Surgeon: Kathie Rhodes, MD;  Location: [facility];  Service: Urology;  Laterality: Right;  . ORCHIECTOMY Left 09/02/2018   Procedure: ORCHIECTOMY;  Surgeon: Kathie Rhodes, MD;  Location: [facility];  Service: Urology;  Laterality: Left;  . ROTATOR CUFF REPAIR Right 12/2004  . TRANSURETHRAL RESECTION OF BLADDER TUMOR N/A 08/09/2012   Procedure: TRANSURETHRAL RESECTION OF BLADDER TUMOR (TURBT) WITH GYRUS WITH MITOMYCIN C;  Surgeon: Claybon Jabs, MD;  Location: [facility];  Service: Urology;  Laterality: N/A;  . TRANSURETHRAL RESECTION OF BLADDER TUMOR WITH GYRUS (TURBT-GYRUS) N/A 02/27/2014   Procedure: TRANSURETHRAL RESECTION OF BLADDER TUMOR WITH GYRUS (TURBT-GYRUS);  Surgeon: Claybon Jabs, MD;  Location: [facility];  Service: Urology;  Laterality: N/A;       Family History  Problem Relation Age of Onset  . Diabetes Mother   . Diabetes Father   . Hypertension Father   . Heart attack Father 60  died age 57  . Alcohol abuse Father   . Colon cancer Neg Hx   . Esophageal cancer Neg Hx   . Stomach cancer Neg Hx   . Rectal cancer Neg Hx     Social History   Tobacco Use  . Smoking status: Current Every Day Smoker    Packs/day: 2.00    Years: 38.00    Pack years: 76.00    Types: Cigarettes  . Smokeless tobacco: Never Used  . Tobacco comment: Has cut back to 1 pack a day  Substance Use Topics  . Alcohol use: No  . Drug use: No    Home Medications Prior to Admission medications   Medication Sig Start Date End Date Taking? Authorizing Provider  albuterol (VENTOLIN HFA) 108 (90 Base) MCG/ACT inhaler Inhale 1-2 puffs into the lungs every 6 (six) hours as needed for wheezing or shortness of breath. Patient taking differently: Inhale 1-2 puffs into the lungs every 6 (six) hours as needed for wheezing or shortness of breath. Taking 2.5 mg daily 12/10/18   Lucrezia Starch, MD  amLODipine (NORVASC) 5 MG tablet Take 5 mg by mouth daily. 04/21/19   [provider]  ARIPiprazole (ABILIFY) 5 MG tablet Take 1 tablet (5 mg total) by mouth daily. 07/29/19   Arfeen, Arlyce Harman, MD  atorvastatin (LIPITOR) 40 MG tablet Take 1 tablet (40 mg total) by mouth daily at 6 PM. 05/06/16   Pucilowska, Jolanta B, MD  Cholecalciferol (VITAMIN D3 PO) Take 1,000 Units by mouth daily.    [provider]  cholestyramine (QUESTRAN) 4 g packet Take 4 g by mouth 3 (three) times daily with meals.    [provider]  cyclobenzaprine (FLEXERIL) 10 MG tablet Take 1 tablet by mouth 3 times daily as needed for muscle spasm. Warning: May cause drowsiness. Patient not taking: Reported on 06/26/2019 03/16/18   Vanessa Kick, MD  diphenoxylate-atropine (LOMOTIL) 2.5-0.025 MG tablet Take 1 tablet by mouth every 6 (six) hours. 06/05/19   Levin Erp, PA  Dulaglutide (TRULICITY) 4.5 UM/3.5TI SOPN Inject 4.5 mg into the skin once a week. 06/02/19   Renato Shin, MD  fentaNYL (DURAGESIC - DOSED MCG/HR) 75 MCG/HR Place 75 mcg onto the skin every 3 (three) days.     [provider]  gabapentin (NEURONTIN) 600 MG tablet Take 600 mg by mouth 2 (two) times daily. 08/20/18   [provider]  hydroxychloroquine (PLAQUENIL) 200 MG tablet Take 200 mg by mouth 2 (two) times daily.     [provider]  insulin glargine, 2 Unit Dial, (TOUJEO MAX SOLOSTAR) 300 UNIT/ML Solostar Pen Inject 220 Units into the skin every morning. And pen needles 1/day 06/02/19   Renato Shin, MD  isosorbide mononitrate (IMDUR) 120 MG 24 hr tablet Take 1 tablet (120 mg total) by mouth daily. Please make annual appt in April with Dr. Percival Spanish for refills. Thank you 05/05/19   Minus Breeding, MD  lamoTRIgine (LAMICTAL) 150 MG tablet Take 1 tablet (150 mg total) by mouth 2 (two) times daily. 04/24/19   Marcial Pacas, MD  levothyroxine (SYNTHROID, LEVOTHROID) 25 MCG tablet Take 25 mcg by mouth daily before breakfast.     [provider]  metFORMIN (GLUCOPHAGE-XR) 500 MG 24 hr  tablet Take 500 mg by mouth 2 (two) times daily. 08/24/18   [provider]  metoCLOPramide (REGLAN) 10 MG tablet Take 10 mg by mouth 4 (four) times daily.    [provider]  mirtazapine ([name])  30 MG tablet Take 1 tablet (30 mg total) by mouth at bedtime. 07/29/19   Arfeen, Arlyce Harman, MD  morphine (MS CONTIN) 60 MG 12 hr tablet Take 60 mg by mouth 2 (two) times daily. Every 12 hours 06/12/19   [provider]  morphine (MSIR) 15 MG tablet Take 15 mg by mouth 2 (two) times daily as needed. For breakthrough pain 06/11/19   [provider]  Na Sulfate-K Sulfate-Mg Sulf (SUPREP BOWEL PREP KIT) 17.5-3.13-1.6 GM/177ML SOLN Suprep as directed, no substitutions 06/26/19   Irene Shipper, MD  omeprazole (PRILOSEC) 20 MG capsule Take 20 mg by mouth every morning.     [provider]  ondansetron (ZOFRAN-ODT) 4 MG disintegrating tablet Take 1 tablet (4 mg total) by mouth every 8 (eight) hours as needed for nausea or vomiting. Patient not taking: Reported on 06/26/2019 07/03/17   Vanessa Kick, MD  pregabalin (LYRICA) 150 MG capsule Take 150 mg by mouth 2 (two) times daily.    [provider]  Prenatal Vit-Fe Fumarate-FA (PREPLUS) 27-1 MG TABS Take 1 tablet by mouth daily. 01/12/18   [provider]  rosuvastatin (CRESTOR) 10 MG tablet Take 10 mg by mouth daily.    [provider]  tamsulosin (FLOMAX) 0.4 MG CAPS capsule Take 0.4 mg by mouth daily. 38m 2 a night    [provider]  venlafaxine XR (EFFEXOR-XR) 150 MG 24 hr capsule Take 1 capsule (150 mg total) by mouth daily with breakfast. 07/29/19   Arfeen, SArlyce Harman MD    Allergies    Celebrex [celecoxib], Hydrocodone, and Sulfa antibiotics  Review of Systems   Review of Systems All systems reviewed and negative, other than as noted in HPI.  Physical Exam Updated Vital Signs BP 100/62   Pulse 86   Temp 97.8 F (36.6 C) (Oral)   Resp 16   Ht 6' (1.829 m)   Wt 101.6 kg   SpO2  95%   BMI 30.38 kg/m   Physical Exam Vitals and nursing note reviewed.  Constitutional:      General: He is not in acute distress.    Appearance: He is well-developed.  HENT:     Head: Normocephalic and atraumatic.  Eyes:     General:        Right eye: No discharge.        Left eye: No discharge.     Conjunctiva/sclera: Conjunctivae normal.  Cardiovascular:     Rate and Rhythm: Normal rate and regular rhythm.     Heart sounds: Normal heart sounds. No murmur. No friction rub. No gallop.   Pulmonary:     Effort: Pulmonary effort is normal. No respiratory distress.     Breath sounds: Normal breath sounds.  Abdominal:     General: There is no distension.     Palpations: Abdomen is soft.     Tenderness: There is no abdominal tenderness.  Musculoskeletal:        General: No tenderness.     Cervical back: Neck supple.  Skin:    General: Skin is warm and dry.  Neurological:     General: No focal deficit present.     Mental Status: He is alert and oriented to person, place, and time.     Cranial Nerves: No cranial nerve deficit.     Sensory: No sensory deficit.     Motor: No weakness.     Coordination: Coordination normal.  Psychiatric:        Behavior:  Behavior normal.        Thought Content: Thought content normal.     ED Results / Procedures / Treatments   Labs (all labs ordered are listed, but only abnormal results are displayed) Labs Reviewed  CBC WITH DIFFERENTIAL/PLATELET - Abnormal; Notable for the following components:      Result Value   Hemoglobin 12.8 (*)    HCT 37.5 (*)    Abs Immature Granulocytes 0.11 (*)    All other components within normal limits  BASIC METABOLIC PANEL  MAGNESIUM    EKG EKG Interpretation  Date/Time:  Saturday Aug 09 2019 11:46:30 EDT Ventricular Rate:  88 PR Interval:    QRS Duration: 86 QT Interval:  363 QTC Calculation: 440 R Axis:   68 Text Interpretation: Sinus rhythm Abnormal T, consider ischemia, lateral leads  Confirmed by Virgel Manifold [phone]) on 08/09/2019 11:54:02 AM   Radiology No results found.  Procedures Procedures (including critical care time)  Medications Ordered in ED Medications  lactated ringers bolus 1,000 mL (1,000 mLs Intravenous New Bag/Given 08/09/19 1223)    ED Course  I have reviewed the triage vital signs and the nursing notes.  Pertinent labs & imaging results that were available during my care of the patient were reviewed by me and considered in my medical decision making (see chart for details).    MDM Rules/Calculators/A&P                      54 year old male with symptoms consistent with orthostatic hypotension.  Symptoms improved with IV fluids although still remains somewhat soft.  He states that he is not on any antihypertensives at this time.  No recent medication changes doubt sepsis or other emergent process.  Advised to stay well-hydrated.  Be mindful with changes of position.  Outpatient follow-up.  Final Clinical Impression(s) / ED Diagnoses Final diagnoses:  Orthostatic hypotension    Rx / DC Orders ED Discharge Orders    None       Virgel Manifold, MD 08/14/19 [phone]

## 2019-08-12 LAB — CBG MONITORING, ED: Glucose-Capillary: 293 mg/dL — ABNORMAL HIGH (ref 70–99)

## 2019-09-17 ENCOUNTER — Other Ambulatory Visit: Payer: Self-pay | Admitting: Cardiology

## 2019-10-09 DIAGNOSIS — M47816 Spondylosis without myelopathy or radiculopathy, lumbar region: Secondary | ICD-10-CM | POA: Diagnosis not present

## 2019-10-09 DIAGNOSIS — F4310 Post-traumatic stress disorder, unspecified: Secondary | ICD-10-CM | POA: Diagnosis not present

## 2019-10-09 DIAGNOSIS — M5136 Other intervertebral disc degeneration, lumbar region: Secondary | ICD-10-CM | POA: Diagnosis not present

## 2019-10-09 DIAGNOSIS — G894 Chronic pain syndrome: Secondary | ICD-10-CM | POA: Diagnosis not present

## 2019-10-29 ENCOUNTER — Telehealth (INDEPENDENT_AMBULATORY_CARE_PROVIDER_SITE_OTHER): Payer: No Typology Code available for payment source | Admitting: Psychiatry

## 2019-10-29 ENCOUNTER — Other Ambulatory Visit: Payer: Self-pay

## 2019-10-29 DIAGNOSIS — F4312 Post-traumatic stress disorder, chronic: Secondary | ICD-10-CM | POA: Diagnosis not present

## 2019-10-29 DIAGNOSIS — F3181 Bipolar II disorder: Secondary | ICD-10-CM

## 2019-10-29 MED ORDER — MIRTAZAPINE 30 MG PO TABS: 30.0000 mg | ORAL_TABLET | Freq: Every day | ORAL | 2 refills | Status: DC

## 2019-10-29 MED ORDER — VENLAFAXINE HCL ER 150 MG PO CP24: 150.0000 mg | ORAL_CAPSULE | Freq: Every day | ORAL | 2 refills | Status: DC

## 2019-10-29 MED ORDER — ARIPIPRAZOLE 5 MG PO TABS: 5.0000 mg | ORAL_TABLET | Freq: Every day | ORAL | 2 refills | Status: DC

## 2019-10-29 NOTE — Progress Notes (Signed)
Virtual Visit via Telephone Note  I connected with Vincent [name] on 10/29/19 at 10:40 AM EDT by telephone and verified that I am speaking with the correct person using two identifiers.  Location: Patient: Home Provider: Home office   I discussed the limitations, risks, security and privacy concerns of performing an evaluation and management service by telephone and the availability of in person appointments. I also discussed with the patient that there may be a patient responsible charge related to this service. The patient expressed understanding and agreed to proceed.   History of Present Illness: Patient is evaluated by phone session.  Lately he has been experiencing more sadness and dysphoria.  He reported that he lost 60% of his right leg muscle mass and he has difficulty walking.  He has neuropathy, tingling and chronic pain.  He has nightmares and flashback but he sleeps good with the medication.  He ruminates because he cannot help as much because of the pain.  He is seeing pain specialist.  He is pleased that his hemoglobin A1c is improved to 6.3.  However we do not have these numbers in the chart.  Patient has difficulty remembering things.  He takes Lyrica, gabapentin and morphine for pain.  He was having diarrhea however since it was changed to morphine his diarrhea is been improved.  In May he was in the ER because of dizziness and he was given fluids.  Since then he has no more dizziness.  Denies any recent seizures.  He is taking Lamictal for seizures.  He like to keep the Abilify, mirtazapine and Effexor which is helping his mood and PTSD symptoms.  Denies any mania, psychosis or any hallucination.  He denies any severe mood swings.  Denies any crying spells or any feeling of hopelessness.  He has seen Leanne in the past but after the [place] he did not return.  He lives with his wife and he had a good support from his family.   Past Psychiatric History:Reviewed. H/[place] brief  hospitalization at [facility] due to suicidal thoughts. No h/osuicidal attempt. H/[place], anxiety, PTSD and heavy substance use. 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 Tried Depakote, Zoloft, lithium, Prozac and Xanax.      Recent Results (from the past 2160 hour(s))  CBG monitoring, ED     Status: Abnormal   Collection Time: 08/09/19 11:41 AM  Result Value Ref Range   Glucose-Capillary 293 (H) 70 - 99 mg/dL    Comment: Glucose reference range applies only to samples taken after fasting for at least 8 hours.   Comment 1 Notify RN    Comment 2 Document in Chart   CBC with Differential     Status: Abnormal   Collection Time: 08/09/19 12:06 PM  Result Value Ref Range   WBC 8.0 4.0 - 10.5 K/uL   RBC 4.44 4.22 - 5.81 MIL/uL   Hemoglobin 12.8 (L) 13.0 - 17.0 g/dL   HCT 37.5 (L) 39 - 52 %   MCV 84.5 80.0 - 100.0 fL   MCH 28.8 26.0 - 34.0 pg   MCHC 34.1 30.0 - 36.0 g/dL   RDW 12.0 11.5 - 15.5 %   Platelets 201 150 - 400 K/uL   nRBC 0.0 0.0 - 0.2 %   Neutrophils Relative % 55 %   Neutro Abs 4.4 1.7 - 7.7 K/uL   Lymphocytes Relative 34 %   Lymphs Abs 2.7 0.7 - 4.0 K/uL   Monocytes Relative 7 %  Monocytes Absolute 0.6 0 - 1 K/uL   Eosinophils Relative 2 %   Eosinophils Absolute 0.2 0 - 0 K/uL   Basophils Relative 1 %   Basophils Absolute 0.1 0 - 0 K/uL   Immature Granulocytes 1 %   Abs Immature Granulocytes 0.11 (H) 0.00 - 0.07 K/uL    Comment: Performed at [facility] [address], [place], [place] [address]  Basic metabolic panel     Status: Abnormal   Collection Time: 08/09/19 12:06 PM  Result Value Ref Range   Sodium 139 135 - 145 mmol/L   Potassium 4.5 3.5 - 5.1 mmol/L   Chloride 105 98 - 111 mmol/L   CO2 22 22 - 32 mmol/L   Glucose, Bld 313 (H) 70 - 99 mg/dL    Comment: Glucose reference range applies only to samples taken after fasting for at least 8 hours.   BUN 17 6 - 20 mg/dL   Creatinine, Ser 1.89 (H) 0.61 - 1.24 mg/dL   Calcium 8.9 8.9 -  10.3 mg/dL   GFR calc non Af Amer 40 (L) >60 mL/min   GFR calc Af Amer 46 (L) >60 mL/min   Anion gap 12 5 - 15    Comment: Performed at [facility] [address], [place], [place] [address]  Magnesium     Status: Abnormal   Collection Time: 08/09/19 12:06 PM  Result Value Ref Range   Magnesium 1.6 (L) 1.7 - 2.4 mg/dL    Comment: Performed at [facility] [address], [place], [place] [address]     Psychiatric Specialty Exam: Physical Exam  Review of Systems  There were no vitals taken for this visit.There is no height or weight on file to calculate BMI.  General Appearance: NA  Eye Contact:  NA  Speech:  Slow  Volume:  Decreased  Mood:  Dysphoric  Affect:  NA  Thought Process:  Descriptions of Associations: Intact  Orientation:  Full (Time, Place, and Person)  Thought Content:  Rumination  Suicidal Thoughts:  No  Homicidal Thoughts:  No  Memory:  Immediate;   Good Recent;   Fair Remote;   Fair  Judgement:  Intact  Insight:  Present  Psychomotor Activity:  NA  Concentration:  Concentration: Fair and Attention Span: Fair  Recall:  [employer] of Knowledge:  Fair  Language:  Good  Akathisia:  No  Handed:  Right  AIMS (if indicated):     Assets:  Communication Skills Desire for Improvement Housing Social Support  ADL's:  Intact  Cognition:  Impaired,  Mild  Sleep:   fair      Assessment and Plan: PTSD.  Bipolar disorder type II.  I reviewed blood work results.  Patient is more anxious and concerned about his general health.  He is not able to walk as much and has chronic pain.  He used to see Secundino Ginger however since [place] started he has not resumed therapy.  I recommend that he should consider seeing a therapist and he agreed.  I will continue Abilify 5 mg daily, mirtazapine 30 mg at bedtime and Effexor XR 150 mg daily.  He is taking other medications for his general health including pain medicine.  I recommend he can discuss with his neurology to  increase the Lamictal dose which can also help his mood symptoms.  He agreed to discuss with his neurologist on his next appointment.  I recommend to call us back if is  any question or any concern.  Follow-up in 3 months.  Follow Up Instructions:    I discussed the assessment and treatment plan with the patient. The patient was provided an opportunity to ask questions and all were answered. The patient agreed with the plan and demonstrated an understanding of the instructions.   The patient was advised to call back or seek an in-person evaluation if the symptoms worsen or if the condition fails to improve as anticipated.  I provided 17 minutes of non-face-to-face time during this encounter.   Kathlee Nations, MD

## 2019-11-10 ENCOUNTER — Other Ambulatory Visit: Payer: Self-pay

## 2019-11-10 ENCOUNTER — Ambulatory Visit
Admission: EM | Admit: 2019-11-10 | Discharge: 2019-11-10 | Disposition: A | Discharge status: Home / Self Care | Payer: No Typology Code available for payment source | Attending: Family | Admitting: Family

## 2019-11-10 ENCOUNTER — Ambulatory Visit (HOSPITAL_COMMUNITY): Payer: No Typology Code available for payment source | Admitting: Licensed Clinical Social Worker

## 2019-11-10 DIAGNOSIS — E11628 Type 2 diabetes mellitus with other skin complications: Secondary | ICD-10-CM

## 2019-11-10 DIAGNOSIS — S61401A Unspecified open wound of right hand, initial encounter: Secondary | ICD-10-CM

## 2019-11-10 DIAGNOSIS — S81802A Unspecified open wound, left lower leg, initial encounter: Secondary | ICD-10-CM

## 2019-11-10 DIAGNOSIS — S91302A Unspecified open wound, left foot, initial encounter: Secondary | ICD-10-CM

## 2019-11-10 DIAGNOSIS — L089 Local infection of the skin and subcutaneous tissue, unspecified: Secondary | ICD-10-CM

## 2019-11-10 DIAGNOSIS — S61402A Unspecified open wound of left hand, initial encounter: Secondary | ICD-10-CM | POA: Diagnosis not present

## 2019-11-10 MED ORDER — AMOXICILLIN-POT CLAVULANATE 875-125 MG PO TABS: 2.0000 | ORAL_TABLET | Freq: Two times a day (BID) | ORAL | 0 refills | Status: AC
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
## 2019-11-10 NOTE — Discharge Instructions (Addendum)
Recommend start Augmentin 875mg - take 2 tablets twice a day as directed. Continue to monitor area and clean wounds daily. Call [facility] today to schedule appointment ASAP for continued care.

## 2019-11-10 NOTE — ED Provider Notes (Signed)
[facility]    CSN: [id number] Arrival date & time: 11/10/19  0903      History   Chief Complaint Chief Complaint  Patient presents with  . Wound Check    HPI Vincent [name] is a 54 y.o. male.   54 year old male with multiple complex chronic health issues presents with open wounds on his left foot and posterior left leg for the past 3 to 4 days. Area on the back of his leg is open but has some crusting present and minimal drainage. Open wound on the top of his left foot is red, swollen with yellowish drainage present. Wife has drawn a line around redness and swelling which seem to be stable. He has not applied any medication to wounds. He has minimal sensation to his feet and is a diabetic with HgbAlc around upper 6/7 but random glucoses around 300. His Dad had to have his foot amputated due to diabetes and wounds and he is concerned over possible loss of his limb. He also burned the top of his hands from hot oil from frying french fries about 1 month ago that are still open, red and sore. He has been to [facility] within the past few months due to open wounds and poor healing. His PCP is on vacation for 3 weeks and he came here to get a referral back to the Wound Center. Other chronic health issues include HTN, CAD, hyperlipidemia, GERD, decreased kidney function, Diabetic neuropathy, thyroid disease, rheumatoid arthritis, COPD, chronic pain syndrome, hx of bladder cancer, essential tremor, anxiety and PTSD. Currently on Metformin, Insulin glargine, Dulaglutide, Plaquenil, Imdur, Lyrica, Gabapentin, Lamictal, Abilify, Crestor, Questran, Effexor XR, [name], Synthroid, Flomax, Prilosec, Reglan, Morphine, and Fentyl patches daily and Albuterol prn.   The history is provided by the patient.    Past Medical History:  Diagnosis Date  . Anxiety   . Bilateral carpal tunnel syndrome   . Bipolar 1 disorder (HCC)   . CAD (coronary artery disease)    Non obstructive on  CTA Oct 2019.   . Cancer (HCC)    bladder cancer  . Chronic pain syndrome    back  . Cold extremities    BLE  . COPD (chronic obstructive pulmonary disease) (HCC)   . Cubital tunnel syndrome on right   . DDD (degenerative disc disease), lumbar   . Diabetic peripheral neuropathy (HCC)   . Gastroparesis   . GERD (gastroesophageal reflux disease)   . Hiatal hernia   . History of bladder cancer urologist-  dr ottlein   papillay TCC (Ta G1)  s/p TURBT and chemo instillation 2014  . History of bronchitis   . History of carpal tunnel syndrome    Bilateral  . History of chest pain 12/2017  . History of chest pain 12/2017   heart cath normal  . History of encephalopathy 05/27/2015   admission w/ acute encephalopathy thought to be secondary to pain meds and COPD  . History of gastric ulcer   . History of Helicobacter pylori infection   . History of kidney stones    not aware  . History of TIA (transient ischemic attack) 2008    no residual  . History of traumatic head injury 2010   w/ LOC  per pt needed stitches, hit in head with a mower blade  . Hyperlipidemia   . Hypertension   . Hypothyroidism   . Insomnia    per sleep study 04-19-2015 without sleep apnea  .   Neuropathy in diabetes ([place])    LOWER EXTREMITIES  . PTSD (post-traumatic stress disorder)   . RA (rheumatoid arthritis) ([place])   . Seizures, transient [facility]) neurologist-  dr Krista Blue--  differential dx complex partial seizure .vs.  mood disorder .vs.  pseudoseizure--  negative EEG's   confusion episodes and staring spells since 11/ 2015  . Sleep apnea    Mild, NO CPAP ORDERED  . Transient confusion NEUOROLOGIST-  DR YAN   Episodes since 11/ 2015--  neurologist dx  differential complex partial seizure  .vs. mood disorder . vs. pseudoseizure  . Tremor   . Type 2 diabetes mellitus treated with insulin [facility])     Patient Active Problem List   Diagnosis Date Noted  . Right leg weakness 04/24/2019  . Right leg pain 04/24/2019   . Gait abnormality 04/24/2019  . TIA (transient ischemic attack) 10/20/2018  . Uncontrolled type 2 DM with hyperosmolar nonketotic hyperglycemia ([place]) 10/19/2018  . Left sided numbness 10/19/2018  . Diabetic autonomic neuropathy associated with type 2 diabetes mellitus ([place]) 02/01/2018  . DM type 2 with diabetic peripheral neuropathy ([place]) 02/01/2018  . Dyslipidemia 01/31/2018  . Coronary artery disease involving native coronary artery of native heart without angina pectoris 01/31/2018  . Chest pain 01/01/2018  . Seizure disorder ([place]) 12/19/2016  . Essential tremor 12/19/2016  . Polypharmacy 12/19/2016  . Chronic post-traumatic stress disorder (PTSD) 06/16/2016  . Tremor 06/16/2016  . Tobacco use disorder 05/05/2016  . Psychophysiological insomnia 06/02/2015  . Altered mental status 05/28/2015  . Acute bronchitis 05/28/2015  . Type 2 diabetes mellitus with hyperglycemia ([place]) 05/28/2015  . Hypothyroidism 05/28/2015  . Bipolar 2 disorder ([place]) 05/28/2015  . History of rheumatoid arthritis 05/28/2015  . Chronic pain 05/28/2015  . Depression 03/18/2015  . Diabetes ([place]) 01/23/2015  . Obesity (BMI 30-39.9) 03/06/2014  . Acute encephalopathy 03/05/2014  . HLD (hyperlipidemia) 03/05/2014  . Anemia, normocytic normochromic 03/05/2014  . Altered mental state   . Protein-calorie malnutrition, severe ([place]) 10/09/2012  . Bladder tumor 08/08/2012  . Biliary dyskinesia 11/02/2010    Past Surgical History:  Procedure Laterality Date  . CARDIAC CATHETERIZATION  12-27-2001  DR Einar Gip  &  05-26-2009  DR Irish Lack   RESULTS FOR BOTH ARE NORMAL CORONARIES AND PERSERVED LVF/ EF 60%  . CARPAL TUNNEL RELEASE Right 09-16-2003  . CARPAL TUNNEL RELEASE Left 02/25/2015   Procedure: LEFT CARPAL TUNNEL RELEASE;  Surgeon: Leanora Cover, MD;  Location: [place];  Service: Orthopedics;  Laterality: Left;  . COLONOSCOPY  2014  . CYSTOSCOPY N/A 10/10/2012   Procedure: CYSTOSCOPY CLOT  EVACUATION FULGERATION OF BLEEDERS ;  Surgeon: Claybon Jabs, MD;  Location: [facility];  Service: Urology;  Laterality: N/A;  . CYSTOSCOPY WITH BIOPSY N/A 11/26/2015   Procedure: CYSTOSCOPY WITH BIOPSY AND FULGURATION;  Surgeon: Kathie Rhodes, MD;  Location: [place];  Service: Urology;  Laterality: N/A;  . ESOPHAGOGASTRODUODENOSCOPY  2014  . LAPAROSCOPIC CHOLECYSTECTOMY  11-17-2010  . NEGATIVE SLEEP STUDY  04-19-2015  in epic  . ORCHIECTOMY Right 02/21/2016   Procedure: SCROTAL ORCHIECTOMY with TESTICULAR PROSTHESIS IMPLANT;  Surgeon: Kathie Rhodes, MD;  Location: [facility];  Service: Urology;  Laterality: Right;  . ORCHIECTOMY Left 09/02/2018   Procedure: ORCHIECTOMY;  Surgeon: Kathie Rhodes, MD;  Location: [facility];  Service: Urology;  Laterality: Left;  . ROTATOR CUFF REPAIR Right 12/2004  . TRANSURETHRAL RESECTION OF BLADDER TUMOR N/A 08/09/2012   Procedure: TRANSURETHRAL RESECTION  OF BLADDER TUMOR (TURBT) WITH GYRUS WITH MITOMYCIN C;  Surgeon: Mark C Ottelin, MD;  Location: [facility];  Service: Urology;  Laterality: N/A;  . TRANSURETHRAL RESECTION OF BLADDER TUMOR WITH GYRUS (TURBT-GYRUS) N/A 02/27/2014   Procedure: TRANSURETHRAL RESECTION OF BLADDER TUMOR WITH GYRUS (TURBT-GYRUS);  Surgeon: Mark C Ottelin, MD;  Location: [facility];  Service: Urology;  Laterality: N/A;       Home Medications    Prior to Admission medications   Medication Sig Start Date End Date Taking? Authorizing Provider  albuterol (VENTOLIN HFA) 108 (90 Base) MCG/ACT inhaler Inhale 1-2 puffs into the lungs every 6 (six) hours as needed for wheezing or shortness of breath. Patient taking differently: Inhale 1-2 puffs into the lungs every 6 (six) hours as needed for wheezing or shortness of breath. Taking 2.5 mg daily 12/10/18   Dykstra, Richard S, MD  amoxicillin-clavulanate (AUGMENTIN) 875-125 MG tablet Take 2 tablets  by mouth 2 (two) times daily for 10 days. 11/10/19 11/20/19  ,  Berry, NP  ARIPiprazole (ABILIFY) 5 MG tablet Take 1 tablet (5 mg total) by mouth daily. 10/29/19   Arfeen, Syed T, MD  atorvastatin (LIPITOR) 40 MG tablet Take 1 tablet (40 mg total) by mouth daily at 6 PM. 05/06/16   Pucilowska, Jolanta B, MD  Cholecalciferol (VITAMIN D3 PO) Take 1,000 Units by mouth daily.    [provider]  cholestyramine (QUESTRAN) 4 g packet Take 4 g by mouth 3 (three) times daily with meals.    [provider]  diphenoxylate-atropine (LOMOTIL) 2.5-0.025 MG tablet Take 1 tablet by mouth every 6 (six) hours. 06/05/19   Lemmon, Jennifer Lynne, PA  Dulaglutide (TRULICITY) 4.5 MG/0.5ML SOPN Inject 4.5 mg into the skin once a week. 06/02/19   Ellison, Sean, MD  fentaNYL (DURAGESIC - DOSED MCG/HR) 75 MCG/HR Place 75 mcg onto the skin every 3 (three) days.     [provider]  gabapentin (NEURONTIN) 600 MG tablet Take 600 mg by mouth 2 (two) times daily. 08/20/18   [provider]  hydroxychloroquine (PLAQUENIL) 200 MG tablet Take 200 mg by mouth 2 (two) times daily.     [provider]  insulin glargine, 2 Unit Dial, (TOUJEO MAX SOLOSTAR) 300 UNIT/ML Solostar Pen Inject 220 Units into the skin every morning. And pen needles 1/day 06/02/19   Ellison, Sean, MD  isosorbide mononitrate (IMDUR) 120 MG 24 hr tablet Take 1 tablet (120 mg total) by mouth daily. Please make annual appt in April with Dr. Hochrein for refills. Thank you 05/05/19   Hochrein, James, MD  lamoTRIgine (LAMICTAL) 150 MG tablet Take 1 tablet (150 mg total) by mouth 2 (two) times daily. 04/24/19   Yan, Yijun, MD  levothyroxine (SYNTHROID, LEVOTHROID) 25 MCG tablet Take 25 mcg by mouth daily before breakfast.     [provider]  metFORMIN (GLUCOPHAGE-XR) 500 MG 24 hr tablet Take 500 mg by mouth 2 (two) times daily. 08/24/18   [provider]  metoCLOPramide (REGLAN) 10 MG tablet Take 10 mg by mouth  4 (four) times daily.    [provider]  mirtazapine ([name]) 30 MG tablet Take 1 tablet (30 mg total) by mouth at bedtime. 10/29/19   Arfeen, Syed T, MD  morphine (MS CONTIN) 60 MG 12 hr tablet Take 60 mg by mouth 2 (two) times daily. Every 12 hours 06/12/19   [provider]  morphine (MSIR) 15 MG tablet Take 15 mg by mouth 2 (two) times   daily as needed. For breakthrough pain 06/11/19   [provider]  Na Sulfate-K Sulfate-Mg Sulf (SUPREP BOWEL PREP KIT) 17.5-3.13-1.6 GM/177ML SOLN Suprep as directed, no substitutions 06/26/19   Perry, John N, MD  omeprazole (PRILOSEC) 20 MG capsule Take 20 mg by mouth every morning.     [provider]  pregabalin (LYRICA) 150 MG capsule Take 150 mg by mouth 2 (two) times daily.    [provider]  Prenatal Vit-Fe Fumarate-FA (PREPLUS) 27-1 MG TABS Take 1 tablet by mouth daily. 01/12/18   [provider]  rosuvastatin (CRESTOR) 10 MG tablet Take 10 mg by mouth daily.    [provider]  tamsulosin (FLOMAX) 0.4 MG CAPS capsule Take 0.4 mg by mouth daily. 1mg 2 a night    [provider]  venlafaxine XR (EFFEXOR-XR) 150 MG 24 hr capsule Take 1 capsule (150 mg total) by mouth daily with breakfast. 10/29/19   Arfeen, Syed T, MD  amLODipine (NORVASC) 5 MG tablet Take 5 mg by mouth daily. 04/21/19 08/09/19  [provider]    Family History Family History  Problem Relation Age of Onset  . Diabetes Mother   . Diabetes Father   . Hypertension Father   . Heart attack Father 55       died age 64  . Alcohol abuse Father   . Colon cancer Neg Hx   . Esophageal cancer Neg Hx   . Stomach cancer Neg Hx   . Rectal cancer Neg Hx     Social History Social History   Tobacco Use  . Smoking status: Current Every Day Smoker    Packs/day: 2.00    Years: 38.00    Pack years: 76.00    Types: Cigarettes  . Smokeless tobacco: Never Used  . Tobacco comment: Has cut back to 1 pack a day  Vaping  Use  . Vaping Use: Never used  Substance Use Topics  . Alcohol use: No  . Drug use: No     Allergies   Celebrex [celecoxib], Hydrocodone, and Sulfa antibiotics   Review of Systems Review of Systems  Constitutional: Positive for fatigue. Negative for appetite change, chills and fever.  Respiratory: Negative for chest tightness and shortness of breath.   Gastrointestinal: Negative for nausea and vomiting.  Musculoskeletal: Positive for arthralgias, joint swelling and myalgias.  Skin: Positive for color change and wound.  Allergic/Immunologic: Positive for immunocompromised state. Negative for food allergies.  Neurological: Positive for tremors (history of ), seizures (history of ) and numbness. Negative for syncope, facial asymmetry and speech difficulty.  Hematological: Bruises/bleeds easily.     Physical Exam Triage Vital Signs ED Triage Vitals  Enc Vitals Group     BP 11/10/19 1010 98/62     Pulse Rate 11/10/19 1010 86     Resp 11/10/19 1010 18     Temp 11/10/19 1010 98.3 F (36.8 C)     Temp Source 11/10/19 1010 Oral     SpO2 11/10/19 1010 96 %     Weight --      Height --      Head Circumference --      Peak Flow --      Pain Score 11/10/19 1011 0     Pain Loc --      Pain Edu? --      Excl. in GC? --    No data found.  Updated Vital Signs BP 98/62 (BP Location: Left Arm)   Pulse 86     Temp 98.3 F (36.8 C) (Oral)   Resp 18   SpO2 96%   Visual Acuity Right Eye Distance:   Left Eye Distance:   Bilateral Distance:    Right Eye Near:   Left Eye Near:    Bilateral Near:     Physical Exam Vitals and nursing note reviewed.  Constitutional:      General: He is awake. He is not in acute distress.    Appearance: He is well-developed.     Comments: He is sitting comfortably on the exam chair in no acute distress but appears anxious about his wounds/skin.   HENT:     Head: Normocephalic and atraumatic.  Eyes:     Extraocular Movements: Extraocular  movements intact.     Conjunctiva/sclera: Conjunctivae normal.  Cardiovascular:     Rate and Rhythm: Normal rate.  Pulmonary:     Effort: Pulmonary effort is normal.  Musculoskeletal:        General: Swelling present.     Right hand: No swelling or tenderness. Normal range of motion. Normal strength. There is no disruption of two-point discrimination. Decreased capillary refill.     Left hand: No swelling or tenderness. Normal range of motion. Normal strength. There is no disruption of two-point discrimination. Decreased capillary refill.     Cervical back: Normal range of motion.     Right lower leg: No tenderness. 1+ Edema present.     Left lower leg: Swelling present. No tenderness. 2+ Edema present.     Right ankle: No ecchymosis. No tenderness. Normal range of motion.     Right Achilles Tendon: Normal.     Left ankle: Swelling present. No ecchymosis. No tenderness. Decreased range of motion. Abnormal pulse.     Left Achilles Tendon: Normal.     Right foot: Decreased capillary refill. No swelling or tenderness. Abnormal pulse.     Left foot: Decreased capillary refill. Swelling present. No tenderness. Abnormal pulse.       Legs:     Comments: Has full range of motion of right foot and ankle with some swelling/non-pitting edema present of lower leg and foot. No distinct redness or rash or wound on right foot/leg.  Decreased range of motion of left ankle, especially with flexion due to swelling and edema. Open wound about 4cm by 3cm with surrounding redness and yellowish drainage (see picture). Minimal tenderness. Decreased sensation of foot and toes. Decreased pulses bilaterally in feet and ankles. Slow capillary refill.  3cm open wound present on left lower posterior leg. Some central crusting present. Skin is pink with no drainage and slight redness. Non-tender.  Multiple open wounds bilaterally on hands (see picture) with various stages of healing and overall poor healing. Minimal  tenderness. Decreased capillary refill and distal sensation. Good radial pulses.   Skin:    General: Skin is warm.     Capillary Refill: Capillary refill takes more than 3 seconds.     Findings: Erythema and wound present. No bruising or ecchymosis.  Neurological:     Mental Status: He is alert and oriented to person, place, and time.     Sensory: Sensory deficit present.     Motor: Motor function is intact.  Psychiatric:        Mood and Affect: Mood normal.        Behavior: Behavior normal. Behavior is cooperative.        Thought Content: Thought content normal.        Judgment: Judgment normal.  UC Treatments / Results  Labs (all labs ordered are listed, but only abnormal results are displayed) Labs Reviewed - No data to display  EKG   Radiology No results found.  Procedures Procedures (including critical care time)  Medications Ordered in UC Medications - No data to display  Initial Impression / Assessment and Plan / UC Course  I have reviewed the triage vital signs and the nursing notes.  Pertinent labs & imaging results that were available during my care of the patient were reviewed by me and considered in my medical decision making (see chart for details).    Discussed with patient that he has an infection of the wound of his left ankle/foot. Other wounds are not healing due to diabetes and multiple contributory factors including poor circulation and continued smoking. Discussed possible poor outcome but recommend go to Wound Care Center ASAP. Will start on Augmentin- would recommend 1000mg 2 tablets twice a day but insurance would not cover XR formulation so will start 875mg 2 tablets twice a day as directed. Applied Bacitracin to open wounds on legs and covered with non-stick gauze and paper tape. Continue to monitor area and clean wounds with soap and water daily. Recommend call the [facility] today (information provided) to  schedule appointment ASAP. May call back to Urgent Care for assistance if unable to get an appointment within 5 days but should be able to be seen since he is a recent patient. Follow-up with Wound center as planned.  Final Clinical Impressions(s) / UC Diagnoses   Final diagnoses:  Open wound of left foot, initial encounter  Open wound of left lower leg, initial encounter  Open wounds of multiple sites of hand, left, initial encounter  Open wounds of multiple sites of hand, right, initial encounter  Type 2 diabetes mellitus with diabetic foot infection (HCC)     Discharge Instructions     Recommend start Augmentin 875mg- take 2 tablets twice a day as directed. Continue to monitor area and clean wounds daily. Call Wound Care Center today to schedule appointment ASAP for continued care.    ED Prescriptions    Medication Sig Dispense Auth. Provider   amoxicillin-clavulanate (AUGMENTIN) 875-125 MG tablet Take 2 tablets by mouth 2 (two) times daily for 10 days. 40 tablet ,  Berry, NP     PDMP not reviewed this encounter.   ,  Berry, NP 11/10/19 2124  

## 2019-11-13 ENCOUNTER — Ambulatory Visit (INDEPENDENT_AMBULATORY_CARE_PROVIDER_SITE_OTHER): Payer: No Typology Code available for payment source | Admitting: Licensed Clinical Social Worker

## 2019-11-13 ENCOUNTER — Other Ambulatory Visit: Payer: Self-pay

## 2019-11-13 DIAGNOSIS — F4312 Post-traumatic stress disorder, chronic: Secondary | ICD-10-CM | POA: Diagnosis not present

## 2019-11-13 DIAGNOSIS — F3181 Bipolar II disorder: Secondary | ICD-10-CM
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
## 2019-11-27 ENCOUNTER — Encounter (HOSPITAL_COMMUNITY): Payer: Self-pay | Admitting: Psychiatry

## 2019-11-27 ENCOUNTER — Telehealth (INDEPENDENT_AMBULATORY_CARE_PROVIDER_SITE_OTHER): Payer: No Typology Code available for payment source | Admitting: Psychiatry

## 2019-11-27 ENCOUNTER — Other Ambulatory Visit: Payer: Self-pay

## 2019-11-27 VITALS — Wt 198.0 lb

## 2019-11-27 DIAGNOSIS — F3181 Bipolar II disorder: Secondary | ICD-10-CM

## 2019-11-27 DIAGNOSIS — M47816 Spondylosis without myelopathy or radiculopathy, lumbar region: Secondary | ICD-10-CM | POA: Diagnosis not present

## 2019-11-27 DIAGNOSIS — F4312 Post-traumatic stress disorder, chronic: Secondary | ICD-10-CM | POA: Diagnosis not present

## 2019-11-27 DIAGNOSIS — E0849 Diabetes mellitus due to underlying condition with other diabetic neurological complication: Secondary | ICD-10-CM | POA: Diagnosis not present

## 2019-11-27 DIAGNOSIS — M5416 Radiculopathy, lumbar region: Secondary | ICD-10-CM | POA: Diagnosis not present

## 2019-11-27 DIAGNOSIS — M5136 Other intervertebral disc degeneration, lumbar region: Secondary | ICD-10-CM | POA: Diagnosis not present

## 2019-11-27 MED ORDER — VENLAFAXINE HCL ER 150 MG PO CP24: 150.0000 mg | ORAL_CAPSULE | Freq: Every day | ORAL | 1 refills | Status: DC

## 2019-11-27 MED ORDER — TRAZODONE HCL 100 MG PO TABS: ORAL_TABLET | ORAL | 1 refills | Status: DC

## 2019-11-27 MED ORDER — ARIPIPRAZOLE 5 MG PO TABS: 5.0000 mg | ORAL_TABLET | Freq: Every day | ORAL | 1 refills | Status: DC

## 2019-11-27 NOTE — Progress Notes (Signed)
Virtual Visit via Telephone Note  I connected with Vincent [name] on 11/27/19 at 11:20 AM EDT by telephone and verified that I am speaking with the correct person using two identifiers.  Location: Patient: home Provider: home office   I discussed the limitations, risks, security and privacy concerns of performing an evaluation and management service by telephone and the availability of in person appointments. I also discussed with the patient that there may be a patient responsible charge related to this service. The patient expressed understanding and agreed to proceed.   History of Present Illness: Patient is evaluated by phone session.  He requested earlier appointment because he is complaining of poor sleep.  He does not feel [name] working for sleep.  He continues to have lack of appetite and he lost another 15 pounds in recent weeks.  He is concerned about his leg wound and he feels his pain, tingling and neuropathy is not getting better.  He is seeing pain management in [place].  He admitted having the rumination and sometimes negative thoughts but denies any hallucination, paranoia or any suicidal thoughts.  He continues to have nightmares and bad dreams.  He is compliant with Abilify, mirtazapine and Effexor but does not feel mirtazapine is working as much.  He has no tremors, shakes or any EPS.  Lives with his wife and he had a good support from her.  He also struggled with memory and recently he has a visit with neurologist.  He is taking Lamictal but his neurologist refused to increase the dose which we suggested that may help his depression.  He admitted sometimes irritability, anger but denies any mania, psychosis, hallucination.  He has no tremors, shakes, rash.  Past Psychiatric History:Reviewed. H/Oone brief hospitalization at [facility] due to suicidal thoughts. No h/osuicidal attempt. H/Oanger, anxiety, PTSD and heavy substance use. Claims to be sober for more  than 10 years. Tried Depakote, Zoloft, lithium, Prozac and Xanax.    Psychiatric Specialty Exam: Physical Exam  Review of Systems  Weight 198 lb (89.8 kg).There is no height or weight on file to calculate BMI.  General Appearance: NA  Eye Contact:  NA  Speech:  Slow  Volume:  Decreased  Mood:  Dysphoric  Affect:  NA  Thought Process:  Descriptions of Associations: Intact  Orientation:  Full (Time, Place, and Person)  Thought Content:  Rumination  Suicidal Thoughts:  No  Homicidal Thoughts:  No  Memory:  Immediate;   Good Recent;   Good Remote;   Fair  Judgement:  Intact  Insight:  Present  Psychomotor Activity:  NA  Concentration:  Concentration: Fair and Attention Span: Fair  Recall:  [employer] of Knowledge:  Good  Language:  Good  Akathisia:  No  Handed:  Right  AIMS (if indicated):     Assets:  Communication Skills Desire for Improvement Housing Resilience Social Support  ADL's:  Intact  Cognition:  WNL  Sleep:   4-6 hrs      Assessment and Plan: PTSD.  Bipolar disorder type II.  Patient is still struggle with insomnia, depression and anxiety.  He does not feel mirtazapine working.  Like to try different medication.  He had a blood work today but we do not have the results.  He has diabetes.  He is hoping his leg wounds get better.  Recommend to discontinue mirtazapine since it is not working.  We will try trazodone 50-100 mg at bedtime for insomnia.  He like to keep  the Abilify and Effexor.  He is taking other pain medicine.  He is taking Lamictal from neurology.  I recommend that he should discuss with his neurologist to address his memory issues.  Recommended to call us back if is any question or any concern.  Follow-up in 2 months.    Follow Up Instructions:    I discussed the assessment and treatment plan with the patient. The patient was provided an opportunity to ask questions and all were answered. The patient agreed with the plan and demonstrated an  understanding of the instructions.   The patient was advised to call back or seek an in-person evaluation if the symptoms worsen or if the condition fails to improve as anticipated.  I provided 20 minutes of non-face-to-face time during this encounter.   Kathlee Nations, MD

## 2019-12-02 ENCOUNTER — Encounter (HOSPITAL_BASED_OUTPATIENT_CLINIC_OR_DEPARTMENT_OTHER): Payer: No Typology Code available for payment source | Attending: Internal Medicine | Admitting: Internal Medicine

## 2019-12-30 ENCOUNTER — Encounter (HOSPITAL_BASED_OUTPATIENT_CLINIC_OR_DEPARTMENT_OTHER): Payer: No Typology Code available for payment source | Admitting: Internal Medicine

## 2020-01-05 ENCOUNTER — Other Ambulatory Visit: Payer: Self-pay

## 2020-01-05 ENCOUNTER — Ambulatory Visit (INDEPENDENT_AMBULATORY_CARE_PROVIDER_SITE_OTHER): Payer: No Typology Code available for payment source | Admitting: Endocrinology

## 2020-01-05 ENCOUNTER — Encounter: Payer: Self-pay | Admitting: Endocrinology

## 2020-01-05 VITALS — BP 124/80 | HR 69 | Ht 72.0 in | Wt 191.0 lb

## 2020-01-05 DIAGNOSIS — E1165 Type 2 diabetes mellitus with hyperglycemia: Secondary | ICD-10-CM | POA: Diagnosis not present

## 2020-01-05 DIAGNOSIS — Z794 Long term (current) use of insulin: Secondary | ICD-10-CM | POA: Diagnosis not present

## 2020-01-05 LAB — POCT GLYCOSYLATED HEMOGLOBIN (HGB A1C)

## 2020-01-05 NOTE — Patient Instructions (Addendum)
check your blood sugar twice a day.  vary the time of day when you check, between before the 3 meals, and at bedtime.  also check if you have symptoms of your blood sugar being too high or too low.  please keep a record of the readings and bring it to your next appointment here (or you can bring the meter itself).  You can write it on any piece of paper.  please call us sooner if your blood sugar goes below 70, or if you have a lot of readings over 200. Please increase the Toujeu to 300 units every morning, for the next 3 days.  Then please return to 220 units Please continue the same Trulicity and metformin.   Blood tests are requested for you today.  We'll let you know about the results.  Please call next week if then blood sugar does not improve.   Please come back for a follow-up appointment in 2 weeks.
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
## 2020-01-06 ENCOUNTER — Other Ambulatory Visit: Payer: Self-pay | Admitting: Endocrinology

## 2020-01-06 DIAGNOSIS — E1100 Type 2 diabetes mellitus with hyperosmolarity without nonketotic hyperglycemic-hyperosmolar coma (NKHHC): Secondary | ICD-10-CM

## 2020-01-06 LAB — BASIC METABOLIC PANEL
BUN: 28 mg/dL — ABNORMAL HIGH (ref 6–23)
CO2: 27 mEq/L (ref 19–32)
Calcium: 9.6 mg/dL (ref 8.4–10.5)
Chloride: 89 mEq/L — ABNORMAL LOW (ref 96–112)
Creatinine, Ser: 1.29 mg/dL (ref 0.40–1.50)
GFR: 63.73 mL/min (ref >60.00)
Glucose, Bld: 947 mg/dL (ref 70–99)
Potassium: 5.7 mEq/L — ABNORMAL HIGH (ref 3.5–5.1)
Sodium: 126 mEq/L — ABNORMAL LOW (ref 135–145)

## 2020-01-06 LAB — TSH: TSH: 1.36 u[IU]/mL (ref 0.35–4.50)

## 2020-01-07 ENCOUNTER — Telehealth: Payer: Self-pay | Admitting: Endocrinology

## 2020-01-07 ENCOUNTER — Observation Stay (HOSPITAL_COMMUNITY)
Admission: EM | Admit: 2020-01-07 | Discharge: 2020-01-08 | Disposition: A | Discharge status: Home / Self Care | Payer: No Typology Code available for payment source | Attending: Internal Medicine | Admitting: Internal Medicine

## 2020-01-07 ENCOUNTER — Other Ambulatory Visit: Payer: Self-pay

## 2020-01-07 ENCOUNTER — Encounter (HOSPITAL_COMMUNITY): Payer: Self-pay

## 2020-01-07 ENCOUNTER — Other Ambulatory Visit (INDEPENDENT_AMBULATORY_CARE_PROVIDER_SITE_OTHER): Payer: No Typology Code available for payment source

## 2020-01-07 DIAGNOSIS — G8929 Other chronic pain: Secondary | ICD-10-CM | POA: Diagnosis present

## 2020-01-07 DIAGNOSIS — Z794 Long term (current) use of insulin: Secondary | ICD-10-CM | POA: Insufficient documentation

## 2020-01-07 DIAGNOSIS — F3181 Bipolar II disorder: Secondary | ICD-10-CM | POA: Diagnosis not present

## 2020-01-07 DIAGNOSIS — K3184 Gastroparesis: Secondary | ICD-10-CM

## 2020-01-07 DIAGNOSIS — E039 Hypothyroidism, unspecified: Secondary | ICD-10-CM | POA: Insufficient documentation

## 2020-01-07 DIAGNOSIS — I10 Essential (primary) hypertension: Secondary | ICD-10-CM | POA: Diagnosis not present

## 2020-01-07 DIAGNOSIS — R739 Hyperglycemia, unspecified: Secondary | ICD-10-CM

## 2020-01-07 DIAGNOSIS — Z8551 Personal history of malignant neoplasm of bladder: Secondary | ICD-10-CM | POA: Diagnosis not present

## 2020-01-07 DIAGNOSIS — I2510 Atherosclerotic heart disease of native coronary artery without angina pectoris: Secondary | ICD-10-CM | POA: Diagnosis not present

## 2020-01-07 DIAGNOSIS — R531 Weakness: Secondary | ICD-10-CM | POA: Diagnosis not present

## 2020-01-07 DIAGNOSIS — E1165 Type 2 diabetes mellitus with hyperglycemia: Secondary | ICD-10-CM | POA: Diagnosis not present

## 2020-01-07 DIAGNOSIS — F17210 Nicotine dependence, cigarettes, uncomplicated: Secondary | ICD-10-CM | POA: Insufficient documentation

## 2020-01-07 DIAGNOSIS — E1100 Type 2 diabetes mellitus with hyperosmolarity without nonketotic hyperglycemic-hyperosmolar coma (NKHHC): Secondary | ICD-10-CM | POA: Diagnosis not present

## 2020-01-07 DIAGNOSIS — Z20822 Contact with and (suspected) exposure to covid-19: Secondary | ICD-10-CM | POA: Insufficient documentation

## 2020-01-07 DIAGNOSIS — Z8739 Personal history of other diseases of the musculoskeletal system and connective tissue: Secondary | ICD-10-CM

## 2020-01-07 DIAGNOSIS — J449 Chronic obstructive pulmonary disease, unspecified: Secondary | ICD-10-CM | POA: Insufficient documentation

## 2020-01-07 DIAGNOSIS — Z7984 Long term (current) use of oral hypoglycemic drugs: Secondary | ICD-10-CM | POA: Diagnosis not present

## 2020-01-07 DIAGNOSIS — G40919 Epilepsy, unspecified, intractable, without status epilepticus: Secondary | ICD-10-CM

## 2020-01-07 DIAGNOSIS — E1140 Type 2 diabetes mellitus with diabetic neuropathy, unspecified: Secondary | ICD-10-CM | POA: Insufficient documentation

## 2020-01-07 DIAGNOSIS — E8881 Metabolic syndrome: Secondary | ICD-10-CM | POA: Diagnosis present

## 2020-01-07 DIAGNOSIS — R000 Tachycardia, unspecified: Secondary | ICD-10-CM | POA: Diagnosis not present

## 2020-01-07 DIAGNOSIS — G40909 Epilepsy, unspecified, not intractable, without status epilepticus: Secondary | ICD-10-CM

## 2020-01-07 DIAGNOSIS — E1142 Type 2 diabetes mellitus with diabetic polyneuropathy: Secondary | ICD-10-CM | POA: Diagnosis present

## 2020-01-07 LAB — BASIC METABOLIC PANEL
Anion gap: 11 (ref 5–15)
BUN: 26 mg/dL — ABNORMAL HIGH (ref 6–23)
BUN: 28 mg/dL — ABNORMAL HIGH (ref 6–20)
CO2: 27 mEq/L (ref 19–32)
CO2: 25 mmol/L (ref 22–32)
Calcium: 9.3 mg/dL (ref 8.4–10.5)
Calcium: 9.2 mg/dL (ref 8.9–10.3)
Chloride: 87 mEq/L — ABNORMAL LOW (ref 96–112)
Chloride: 90 mmol/L — ABNORMAL LOW (ref 98–111)
Creatinine, Ser: 1.07 mg/dL (ref 0.40–1.50)
Creatinine, Ser: 0.94 mg/dL (ref 0.61–1.24)
GFR, Estimated: >60 mL/min (ref >60)
GFR: 79.76 mL/min (ref >60.00)
Glucose, Bld: 892 mg/dL (ref 70–99)
Glucose, Bld: 699 mg/dL (ref 70–99)
Potassium: 4.7 mEq/L (ref 3.5–5.1)
Potassium: 4.4 mmol/L (ref 3.5–5.1)
Sodium: 123 mEq/L — ABNORMAL LOW (ref 135–145)
Sodium: 126 mmol/L — ABNORMAL LOW (ref 135–145)

## 2020-01-07 LAB — CBC WITH DIFFERENTIAL/PLATELET
Abs Immature Granulocytes: 0.05 10*3/uL (ref 0.00–0.07)
Basophils Absolute: 0.1 10*3/uL (ref 0.0–0.1)
Basophils Relative: 1 %
Eosinophils Absolute: 0.3 10*3/uL (ref 0.0–0.5)
Eosinophils Relative: 3 %
HCT: 40.7 % (ref 39.0–52.0)
Hemoglobin: 14.6 g/dL (ref 13.0–17.0)
Immature Granulocytes: 1 %
Lymphocytes Relative: 29 %
Lymphs Abs: 2.9 10*3/uL (ref 0.7–4.0)
MCH: 28.9 pg (ref 26.0–34.0)
MCHC: 35.9 g/dL (ref 30.0–36.0)
MCV: 80.4 fL (ref 80.0–100.0)
Monocytes Absolute: 0.5 10*3/uL (ref 0.1–1.0)
Monocytes Relative: 5 %
Neutro Abs: 6.1 10*3/uL (ref 1.7–7.7)
Neutrophils Relative %: 61 %
Platelets: 229 10*3/uL (ref 150–400)
RBC: 5.06 MIL/uL (ref 4.22–5.81)
RDW: 12.9 % (ref 11.5–15.5)
WBC: 9.9 10*3/uL (ref 4.0–10.5)
nRBC: 0 % (ref 0.0–0.2)

## 2020-01-07 LAB — URINALYSIS, ROUTINE W REFLEX MICROSCOPIC
Bacteria, UA: NONE SEEN
Bilirubin Urine: NEGATIVE
Glucose, UA: >=500 mg/dL — AB
Hgb urine dipstick: NEGATIVE
Ketones, ur: NEGATIVE mg/dL
Leukocytes,Ua: NEGATIVE
Nitrite: NEGATIVE
Protein, ur: NEGATIVE mg/dL
Specific Gravity, Urine: 1.029 (ref 1.005–1.030)
pH: 6 (ref 5.0–8.0)

## 2020-01-07 LAB — CBG MONITORING, ED
Glucose-Capillary: 182 mg/dL — ABNORMAL HIGH (ref 70–99)
Glucose-Capillary: 205 mg/dL — ABNORMAL HIGH (ref 70–99)
Glucose-Capillary: 252 mg/dL — ABNORMAL HIGH (ref 70–99)
Glucose-Capillary: 309 mg/dL — ABNORMAL HIGH (ref 70–99)
Glucose-Capillary: 330 mg/dL — ABNORMAL HIGH (ref 70–99)
Glucose-Capillary: >600 mg/dL (ref 70–99)

## 2020-01-07 LAB — I-STAT CHEM 8, ED
BUN: 20 mg/dL (ref 6–20)
Calcium, Ion: 1.05 mmol/L — ABNORMAL LOW (ref 1.15–1.40)
Chloride: 99 mmol/L (ref 98–111)
Creatinine, Ser: 0.4 mg/dL — ABNORMAL LOW (ref 0.61–1.24)
Glucose, Bld: 590 mg/dL (ref 70–99)
HCT: 33 % — ABNORMAL LOW (ref 39.0–52.0)
Hemoglobin: 11.2 g/dL — ABNORMAL LOW (ref 13.0–17.0)
Potassium: 3.3 mmol/L — ABNORMAL LOW (ref 3.5–5.1)
Sodium: 135 mmol/L (ref 135–145)
TCO2: 20 mmol/L — ABNORMAL LOW (ref 22–32)

## 2020-01-07 LAB — RESPIRATORY PANEL BY RT PCR (FLU A&B, COVID)
Influenza A by PCR: NEGATIVE
Influenza B by PCR: NEGATIVE
SARS Coronavirus 2 by RT PCR: NEGATIVE

## 2020-01-07 LAB — BETA-HYDROXYBUTYRIC ACID: Beta-Hydroxybutyric Acid: 0.13 mmol/L (ref 0.05–0.27)

## 2020-01-07 MED ORDER — GABAPENTIN 300 MG PO CAPS
600.0000 mg | ORAL_CAPSULE | Freq: Two times a day (BID) | ORAL | Status: DC
  Administered 2020-01-07 – 2020-01-08 (×2): 600 mg via ORAL
  Filled 2020-01-07 (×2): qty 2

## 2020-01-07 MED ORDER — POTASSIUM CHLORIDE 10 MEQ/100ML IV SOLN: 10.0000 meq | INTRAVENOUS | Status: DC

## 2020-01-07 MED ORDER — DEXTROSE 50 % IV SOLN: 0.0000 mL | INTRAVENOUS | Status: DC | PRN

## 2020-01-07 MED ORDER — ROSUVASTATIN CALCIUM 10 MG PO TABS
10.0000 mg | ORAL_TABLET | Freq: Every day | ORAL | Status: DC
  Administered 2020-01-08: 10 mg via ORAL
  Filled 2020-01-07: qty 1

## 2020-01-07 MED ORDER — DEXTROSE IN LACTATED RINGERS 5 % IV SOLN: INTRAVENOUS | Status: DC

## 2020-01-07 MED ORDER — PREGABALIN 50 MG PO CAPS: 150.0000 mg | ORAL_CAPSULE | ORAL | Status: DC

## 2020-01-07 MED ORDER — LEVOTHYROXINE SODIUM 25 MCG PO TABS
25.0000 ug | ORAL_TABLET | Freq: Every day | ORAL | Status: DC
  Administered 2020-01-08: 25 ug via ORAL
  Filled 2020-01-07: qty 1

## 2020-01-07 MED ORDER — POTASSIUM CHLORIDE 10 MEQ/100ML IV SOLN
10.0000 meq | Freq: Once | INTRAVENOUS | Status: AC
  Administered 2020-01-07: 10 meq via INTRAVENOUS
  Filled 2020-01-07: qty 100

## 2020-01-07 MED ORDER — MORPHINE SULFATE ER 30 MG PO TBCR
60.0000 mg | EXTENDED_RELEASE_TABLET | Freq: Two times a day (BID) | ORAL | Status: DC
  Administered 2020-01-07 – 2020-01-08 (×2): 60 mg via ORAL
  Filled 2020-01-07 (×2): qty 4

## 2020-01-07 MED ORDER — ALBUTEROL SULFATE HFA 108 (90 BASE) MCG/ACT IN AERS: 1.0000 | INHALATION_SPRAY | Freq: Four times a day (QID) | RESPIRATORY_TRACT | Status: DC | PRN

## 2020-01-07 MED ORDER — METOCLOPRAMIDE HCL 10 MG PO TABS
10.0000 mg | ORAL_TABLET | Freq: Three times a day (TID) | ORAL | Status: DC
  Administered 2020-01-08 (×2): 10 mg via ORAL
  Filled 2020-01-07 (×2): qty 1

## 2020-01-07 MED ORDER — LACTATED RINGERS IV SOLN: INTRAVENOUS | Status: DC

## 2020-01-07 MED ORDER — ENOXAPARIN SODIUM 40 MG/0.4ML ~~LOC~~ SOLN
40.0000 mg | SUBCUTANEOUS | Status: DC
  Administered 2020-01-07: 40 mg via SUBCUTANEOUS
  Filled 2020-01-07: qty 0.4

## 2020-01-07 MED ORDER — INSULIN REGULAR(HUMAN) IN NACL 100-0.9 UT/100ML-% IV SOLN
INTRAVENOUS | Status: DC
  Administered 2020-01-07: 17 [IU]/h via INTRAVENOUS
  Filled 2020-01-07: qty 100

## 2020-01-07 MED ORDER — TAMSULOSIN HCL 0.4 MG PO CAPS
0.8000 mg | ORAL_CAPSULE | Freq: Every day | ORAL | Status: DC
  Administered 2020-01-07: 0.8 mg via ORAL
  Filled 2020-01-07: qty 2

## 2020-01-07 MED ORDER — ISOSORBIDE MONONITRATE ER 60 MG PO TB24
120.0000 mg | ORAL_TABLET | Freq: Every day | ORAL | Status: DC
  Administered 2020-01-08: 120 mg via ORAL
  Filled 2020-01-07: qty 2

## 2020-01-07 MED ORDER — LAMOTRIGINE 25 MG PO TABS
150.0000 mg | ORAL_TABLET | Freq: Two times a day (BID) | ORAL | Status: DC
  Administered 2020-01-07 – 2020-01-08 (×2): 150 mg via ORAL
  Filled 2020-01-07 (×2): qty 2

## 2020-01-07 MED ORDER — INSULIN REGULAR(HUMAN) IN NACL 100-0.9 UT/100ML-% IV SOLN
INTRAVENOUS | Status: DC
  Filled 2020-01-07: qty 100

## 2020-01-07 MED ORDER — MORPHINE SULFATE 15 MG PO TABS: 15.0000 mg | ORAL_TABLET | Freq: Two times a day (BID) | ORAL | Status: DC | PRN

## 2020-01-07 MED ORDER — PREGABALIN 50 MG PO CAPS
300.0000 mg | ORAL_CAPSULE | Freq: Every day | ORAL | Status: DC
  Filled 2020-01-07: qty 6

## 2020-01-07 MED ORDER — VENLAFAXINE HCL ER 75 MG PO CP24
300.0000 mg | ORAL_CAPSULE | Freq: Every day | ORAL | Status: DC
  Administered 2020-01-08: 300 mg via ORAL
  Filled 2020-01-07: qty 4

## 2020-01-07 MED ORDER — HYDROXYCHLOROQUINE SULFATE 200 MG PO TABS
200.0000 mg | ORAL_TABLET | Freq: Two times a day (BID) | ORAL | Status: DC
  Administered 2020-01-08: 200 mg via ORAL
  Filled 2020-01-07 (×2): qty 1

## 2020-01-07 MED ORDER — PREGABALIN 50 MG PO CAPS
150.0000 mg | ORAL_CAPSULE | Freq: Every day | ORAL | Status: DC
  Administered 2020-01-08: 150 mg via ORAL
  Filled 2020-01-07 (×2): qty 3

## 2020-01-07 MED ORDER — TRAZODONE HCL 100 MG PO TABS
100.0000 mg | ORAL_TABLET | Freq: Every day | ORAL | Status: DC
  Administered 2020-01-07: 100 mg via ORAL
  Filled 2020-01-07: qty 1

## 2020-01-07 MED ORDER — FENOFIBRATE 160 MG PO TABS
160.0000 mg | ORAL_TABLET | Freq: Every day | ORAL | Status: DC
  Administered 2020-01-08: 160 mg via ORAL
  Filled 2020-01-07: qty 1

## 2020-01-07 MED ORDER — ARIPIPRAZOLE 5 MG PO TABS
5.0000 mg | ORAL_TABLET | Freq: Every day | ORAL | Status: DC
  Administered 2020-01-08: 5 mg via ORAL
  Filled 2020-01-07: qty 1

## 2020-01-07 NOTE — ED Notes (Signed)
I made PA aware of critical I Stat Chem 8 result
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
## 2020-01-07 NOTE — Telephone Encounter (Signed)
Spoke with patient wife regarding lab results.  Per Dr Loanne Drilling patient needs to go to the hospital due to high glucose levels.
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
## 2020-01-07 NOTE — Telephone Encounter (Signed)
please contact patient right away: The staff says when you were here to have your blood drawn just now, you did not look or feel well at all.  Please go to ER now.
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
## 2020-01-07 NOTE — Telephone Encounter (Signed)
Highly recommended to patient that he go to the emergency department.

## 2020-01-08 ENCOUNTER — Telehealth: Payer: Self-pay | Admitting: Endocrinology

## 2020-01-08 DIAGNOSIS — G894 Chronic pain syndrome: Secondary | ICD-10-CM

## 2020-01-08 DIAGNOSIS — Z794 Long term (current) use of insulin: Secondary | ICD-10-CM | POA: Diagnosis not present

## 2020-01-08 DIAGNOSIS — E1165 Type 2 diabetes mellitus with hyperglycemia: Secondary | ICD-10-CM | POA: Diagnosis not present

## 2020-01-08 LAB — BASIC METABOLIC PANEL
Anion gap: 12 (ref 5–15)
Anion gap: 8 (ref 5–15)
BUN: 22 mg/dL — ABNORMAL HIGH (ref 6–20)
BUN: 20 mg/dL (ref 6–20)
CO2: 26 mmol/L (ref 22–32)
CO2: 25 mmol/L (ref 22–32)
Calcium: 9.3 mg/dL (ref 8.9–10.3)
Calcium: 8.8 mg/dL — ABNORMAL LOW (ref 8.9–10.3)
Chloride: 99 mmol/L (ref 98–111)
Chloride: 101 mmol/L (ref 98–111)
Creatinine, Ser: 0.75 mg/dL (ref 0.61–1.24)
Creatinine, Ser: 0.75 mg/dL (ref 0.61–1.24)
GFR, Estimated: >60 mL/min (ref >60)
GFR, Estimated: >60 mL/min (ref >60)
Glucose, Bld: 170 mg/dL — ABNORMAL HIGH (ref 70–99)
Glucose, Bld: 175 mg/dL — ABNORMAL HIGH (ref 70–99)
Potassium: 3 mmol/L — ABNORMAL LOW (ref 3.5–5.1)
Potassium: 4.4 mmol/L (ref 3.5–5.1)
Sodium: 137 mmol/L (ref 135–145)
Sodium: 134 mmol/L — ABNORMAL LOW (ref 135–145)

## 2020-01-08 LAB — CBG MONITORING, ED
Glucose-Capillary: 172 mg/dL — ABNORMAL HIGH (ref 70–99)
Glucose-Capillary: 133 mg/dL — ABNORMAL HIGH (ref 70–99)
Glucose-Capillary: 128 mg/dL — ABNORMAL HIGH (ref 70–99)
Glucose-Capillary: 119 mg/dL — ABNORMAL HIGH (ref 70–99)
Glucose-Capillary: 160 mg/dL — ABNORMAL HIGH (ref 70–99)
Glucose-Capillary: 168 mg/dL — ABNORMAL HIGH (ref 70–99)
Glucose-Capillary: 183 mg/dL — ABNORMAL HIGH (ref 70–99)
Glucose-Capillary: 139 mg/dL — ABNORMAL HIGH (ref 70–99)

## 2020-01-08 MED ORDER — INSULIN ASPART 100 UNIT/ML ~~LOC~~ SOLN
0.0000 [IU] | Freq: Three times a day (TID) | SUBCUTANEOUS | Status: DC
  Administered 2020-01-08: 2 [IU] via SUBCUTANEOUS
  Administered 2020-01-08: 3 [IU] via SUBCUTANEOUS
  Filled 2020-01-08: qty 0.15

## 2020-01-08 MED ORDER — INSULIN GLARGINE 100 UNIT/ML ~~LOC~~ SOLN
220.0000 [IU] | Freq: Every day | SUBCUTANEOUS | Status: DC
  Administered 2020-01-08: 220 [IU] via SUBCUTANEOUS
  Filled 2020-01-08: qty 2.2

## 2020-01-08 MED ORDER — INSULIN GLARGINE (2 UNIT DIAL) 300 UNIT/ML ~~LOC~~ SOPN: 220.0000 [IU] | PEN_INJECTOR | Freq: Every morning | SUBCUTANEOUS | Status: DC

## 2020-01-08 MED ORDER — POTASSIUM CHLORIDE CRYS ER 20 MEQ PO TBCR
20.0000 meq | EXTENDED_RELEASE_TABLET | Freq: Once | ORAL | Status: AC
  Administered 2020-01-08: 20 meq via ORAL
  Filled 2020-01-08: qty 1

## 2020-01-08 MED ORDER — INSULIN ASPART 100 UNIT/ML ~~LOC~~ SOLN
0.0000 [IU] | Freq: Every day | SUBCUTANEOUS | Status: DC
  Filled 2020-01-08: qty 0.05

## 2020-01-08 MED ORDER — POTASSIUM CHLORIDE 10 MEQ/100ML IV SOLN
10.0000 meq | INTRAVENOUS | Status: AC
  Administered 2020-01-08 (×4): 10 meq via INTRAVENOUS
  Filled 2020-01-08: qty 100

## 2020-01-08 MED ORDER — TOUJEO MAX SOLOSTAR 300 UNIT/ML ~~LOC~~ SOPN: 220.0000 [IU] | PEN_INJECTOR | SUBCUTANEOUS | Status: DC

## 2020-01-08 MED ORDER — INSULIN ASPART 100 UNIT/ML ~~LOC~~ SOLN
10.0000 [IU] | Freq: Three times a day (TID) | SUBCUTANEOUS | Status: DC
  Administered 2020-01-08 (×2): 10 [IU] via SUBCUTANEOUS
  Filled 2020-01-08: qty 0.1

## 2020-01-08 NOTE — Progress Notes (Addendum)
Ordered 4 runs IV + 23meq PO of K earlier this evening.  Repeat BMP pending for this morning.  BGLs continue to be controlled.  Glucostabilizer only going at 3u an hour right now.  Again, see my H+P, but given complexity of case and initial hyperglycemia despite massive basal insulin usage yesterday, dont want to transition the patient just yet.  Would prefer to have day team call and discuss with patients endocrinologist first.  Will let patient eat carb modified diet in the mean time.

## 2020-01-08 NOTE — Progress Notes (Signed)
Inpatient Diabetes Program Recommendations  AACE/ADA: New Consensus Statement on Inpatient Glycemic Control (2015)  Target Ranges:  Prepandial:   less than 140 mg/dL      Peak postprandial:   less than 180 mg/dL (1-2 hours)      Critically ill patients:  140 - 180 mg/dL   Lab Results  Component Value Date   GLUCAP 183 (H) 01/08/2020   HGBA1C  01/05/2020     Comment:     To high to result    Review of Glycemic Control  Diabetes history: DM2 Outpatient Diabetes medications: Toujeo 220 units qd (300 units x 3 days) + Trulicity 4.5 q week + Metformin 500 mg bid Current orders for Inpatient glycemic control: Lantus 220 units + Novolog meal coverage 10 units tid if eats 50% + Novolog moderate correction tid + hs 0-5 units  Inpatient Diabetes Program Recommendations:   Patient sees endocrinologist Dr. Renato Shin and had increased Toujeo x 3 days from 220 due to elevated CBGs. Agree with treatment plan and plan to see pt.  Thank you, Nani Gasser. Sonu Kruckenberg, RN, MSN, CDE  Diabetes Coordinator Inpatient Glycemic Control Team Team Pager [phone] (8am-5pm) 01/08/2020 10:42 AM

## 2020-01-08 NOTE — ED Notes (Signed)
Hospitalist contacted for further recommendations for ENdotool transition, due to patient's current insulin regimen at home, patient should remain on insulin gtt but be able to eat until day team comes in and consults with Endocrinologist

## 2020-01-08 NOTE — Discharge Summary (Signed)
Physician Discharge Summary  Vincent [name] NAT:[id number] DOB: 1965-08-27 DOA: 01/07/2020  PCP: Hayden Rasmussen, MD  Admit date: 01/07/2020 Discharge date: 01/08/2020  Admitted From: Home Disposition: Home  Recommendations for Outpatient Follow-up:  1. Follow up with PCP in 1-2 weeks 2. Follow-up with endocrine as a scheduled.  Discharge Condition: Stable CODE STATUS: Full code Diet recommendation: Low-salt and low-carb diet  Discharge summary: 54 year old gentleman with extensive history of type 2 diabetes on insulin, chronic back pain, diabetic neuropathy, gastroparesis and bipolar disorder sent to the ER by endocrine office due to very high blood sugars more than 800. Apparently, he has been going to endocrine office with high blood sugars and at home he was not taking any insulin for last 2 weeks. On admission, blood glucose was 699 no anion gap.  He was admitted and treated with insulin infusion.  Hospital course: Overnight he stayed in the ER, he needed about 20 units of insulin IV to control his blood sugars.  Retrospectively, patient admitted that he has not taken any injection for last 2 weeks. Currently stabilized.  Blood sugars less than 180.  He was given his long acting insulin and currently asymptomatic.  Discussed and updated his endocrinologist.  Plan: Extensive discussion, counseling and education given.  Also discussed with patient's spouse at bedside. He was given his dose of insulin today morning. His presentation is consistent with not taking medications as prescribed. He will simply go back to his home regimen and start taking it consistently.  Discharge Diagnoses:  Principal Problem:   Hyperglycemia due to type 2 diabetes mellitus (HCC) Active Problems:   Bipolar 2 disorder (HCC)   History of rheumatoid arthritis   Chronic pain   Seizure disorder ([place])   DM type 2 with diabetic peripheral neuropathy (HCC)   Gastroparesis   Resistance to  insulin    Discharge Instructions  Discharge Instructions    Diet - low sodium heart healthy   Complete by: As directed    Diet Carb Modified   Complete by: As directed    Discharge instructions   Complete by: As directed    Consistently take your insulin at home  Check your blood sugars and keep a record and bring it to doctors office on follow up.   Increase activity slowly   Complete by: As directed      Allergies as of 01/08/2020      Reactions   Celebrex [celecoxib] Anaphylaxis, Rash   Hydrocodone Rash, Other (See Comments)   "blisters developed on arms"   Sulfa Antibiotics Rash      Medication List    TAKE these medications   albuterol 108 (90 Base) MCG/ACT inhaler Commonly known as: VENTOLIN HFA Inhale 1-2 puffs into the lungs every 6 (six) hours as needed for wheezing or shortness of breath.   ARIPiprazole 5 MG tablet Commonly known as: ABILIFY Take 1 tablet (5 mg total) by mouth daily.   fenofibrate 160 MG tablet Take 160 mg by mouth daily.   gabapentin 600 MG tablet Commonly known as: NEURONTIN Take 600 mg by mouth 2 (two) times daily.   hydroxychloroquine 200 MG tablet Commonly known as: PLAQUENIL Take 200 mg by mouth 2 (two) times daily.   isosorbide mononitrate 120 MG 24 hr tablet Commonly known as: IMDUR Take 1 tablet (120 mg total) by mouth daily. Please make annual appt in April with Dr. Percival Spanish for refills. Thank you   lamoTRIgine 150 MG tablet Commonly known as: LAMICTAL Take 1  tablet (150 mg total) by mouth 2 (two) times daily.   levothyroxine 25 MCG tablet Commonly known as: SYNTHROID Take 25 mcg by mouth daily before breakfast.   metFORMIN 500 MG 24 hr tablet Commonly known as: GLUCOPHAGE-XR Take 500 mg by mouth 2 (two) times daily.   metoCLOPramide 10 MG tablet Commonly known as: REGLAN Take 10 mg by mouth in the morning, at noon, and at bedtime.   morphine 15 MG tablet Commonly known as: MSIR Take 15 mg by mouth 2 (two)  times daily as needed. For breakthrough pain   morphine 60 MG 12 hr tablet Commonly known as: MS CONTIN Take 60 mg by mouth 2 (two) times daily. Every 12 hours   omeprazole 20 MG capsule Commonly known as: PRILOSEC Take 20 mg by mouth daily.   pregabalin 150 MG capsule Commonly known as: LYRICA Take 150-300 mg by mouth See admin instructions. Takes 1 tablet in the morning and 2 tablets at night   rosuvastatin 10 MG tablet Commonly known as: CRESTOR Take 10 mg by mouth daily.   tamsulosin 0.4 MG Caps capsule Commonly known as: FLOMAX Take 0.8 mg by mouth daily.   Toujeo Max SoloStar 300 UNIT/ML Solostar Pen Generic drug: insulin glargine (2 Unit Dial) Inject 220 Units into the skin every morning. And pen needles 1/day What changed:   how much to take  when to take this  additional instructions   traZODone 100 MG tablet Commonly known as: DESYREL Take 1/2 to one tab at bed time What changed:   how much to take  how to take this  when to take this  additional instructions   Trulicity 4.5 KZ/6.0FU Sopn Generic drug: Dulaglutide Inject 4.5 mg into the skin once a week.   venlafaxine XR 150 MG 24 hr capsule Commonly known as: EFFEXOR-XR Take 1 capsule (150 mg total) by mouth daily with breakfast. What changed: how much to take   Vitamin D (Ergocalciferol) 1.25 MG (50000 UNIT) Caps capsule Commonly known as: DRISDOL Take 50,000 Units by mouth once a week. Wednesdays   Zenpep 5000-24000 units Cpep Generic drug: Pancrelipase (Lip-Prot-Amyl) Take 1-2 capsules by mouth See admin instructions. Takes 1 capsule in the morning and 2 capsules at night       Allergies  Allergen Reactions  . Celebrex [Celecoxib] Anaphylaxis and Rash  . Hydrocodone Rash and Other (See Comments)    "blisters developed on arms"  . Sulfa Antibiotics Rash    Consultations:  None  Phone consultation with endocrine   Procedures/Studies: No results found. (Echo, Carotid, EGD,  Colonoscopy, ERCP)    Subjective: Patient seen and examined.  I examined him later today with wife at the bedside to discuss about discharge planning.  Patient has some numbness on his bilateral hands otherwise no other complaints.  Blood sugars has been stable since morning. Eager to go home.   Discharge Exam: Vitals:   01/08/20 1135 01/08/20 1256  BP: 117/77 93/62  Pulse: 95 98  Resp: 19 18  Temp:    SpO2: 100% 99%   Vitals:   01/08/20 1015 01/08/20 1030 01/08/20 1135 01/08/20 1256  BP: 111/71 119/74 117/77 93/62  Pulse: 77 84 95 98  Resp: 17 16 19 18   Temp:      TempSrc:      SpO2: 99% 97% 100% 99%    General: Pt is alert, awake, not in acute distress Cardiovascular: RRR, S1/S2 +, no rubs, no gallops Respiratory: CTA bilaterally, no wheezing, no rhonchi  Abdominal: Soft, NT, ND, bowel sounds + Extremities: no edema, no cyanosis Patient does have some burn marks on back of both hands.    The results of significant diagnostics from this hospitalization (including imaging, microbiology, ancillary and laboratory) are listed below for reference.     Microbiology: Recent Results (from the past 240 hour(s))  Respiratory Panel by RT PCR (Flu A&B, Covid) - Nasopharyngeal Swab     Status: None   Collection Time: 01/07/20  8:25 PM   Specimen: Nasopharyngeal Swab  Result Value Ref Range Status   SARS Coronavirus 2 by RT PCR NEGATIVE NEGATIVE Final    Comment: (NOTE) SARS-CoV-2 target nucleic acids are NOT DETECTED.  The SARS-CoV-2 RNA is generally detectable in upper respiratoy specimens during the acute phase of infection. The lowest concentration of SARS-CoV-2 viral copies this assay can detect is 131 copies/mL. A negative result does not preclude SARS-Cov-2 infection and should not be used as the sole basis for treatment or other patient management decisions. A negative result may occur with  improper specimen collection/handling, submission of specimen other than  nasopharyngeal swab, presence of viral mutation(s) within the areas targeted by this assay, and inadequate number of viral copies (<131 copies/mL). A negative result must be combined with clinical observations, patient history, and epidemiological information. The expected result is Negative.  Fact Sheet for Patients:  PinkCheek.be  Fact Sheet for Healthcare Providers:  GravelBags.it  This test is no t yet approved or cleared by the [place] FDA and  has been authorized for detection and/or diagnosis of SARS-CoV-2 by FDA under an Emergency Use Authorization (EUA). This EUA will remain  in effect (meaning this test can be used) for the duration of the COVID-19 declaration under Section 564(b)(1) of the Act, 21 U.S.C. section 360bbb-3(b)(1), unless the authorization is terminated or revoked sooner.     Influenza A by PCR NEGATIVE NEGATIVE Final   Influenza B by PCR NEGATIVE NEGATIVE Final    Comment: (NOTE) The Xpert Xpress SARS-CoV-2/FLU/RSV assay is intended as an aid in  the diagnosis of influenza from Nasopharyngeal swab specimens and  should not be used as a sole basis for treatment. Nasal washings and  aspirates are unacceptable for Xpert Xpress SARS-CoV-2/FLU/RSV  testing.  Fact Sheet for Patients: PinkCheek.be  Fact Sheet for Healthcare Providers: GravelBags.it  This test is not yet approved or cleared by the [place] FDA and  has been authorized for detection and/or diagnosis of SARS-CoV-2 by  FDA under an Emergency Use Authorization (EUA). This EUA will remain  in effect (meaning this test can be used) for the duration of the  Covid-19 declaration under Section 564(b)(1) of the Act, 21  U.S.C. section 360bbb-3(b)(1), unless the authorization is  terminated or revoked. Performed at [facility], [place] [address], [place], [place] [address]      Labs: BNP (last 3 results) No results for input(s): BNP in the last 8760 hours. Basic Metabolic Panel: Recent Labs  Lab 01/05/20 1646 01/05/20 1646 01/07/20 1401 01/07/20 1712 01/07/20 1720 01/08/20 0001 01/08/20 0638  NA 126*   < > 123* 126* 135 137 134*  K 5.7*   < > 4.7 4.4 3.3* 3.0* 4.4  CL 89*   < > 87* 90* 99 99 101  CO2 27  --  27 25  --  26 25  GLUCOSE 947*   < > 892* 699* 590* 170* 175*  BUN 28*   < > 26* 28* 20 22* 20  CREATININE 1.29   < > 1.07 0.94 0.40* 0.75 0.75  CALCIUM 9.6  --  9.3 9.2  --  9.3 8.8*   < > = values in this interval not displayed.   Liver Function Tests: No results for input(s): AST, ALT, ALKPHOS, BILITOT, PROT, ALBUMIN in the last 168 hours. No results for input(s): LIPASE, AMYLASE in the last 168 hours. No results for input(s): AMMONIA in the last 168 hours. CBC: Recent Labs  Lab 01/07/20 1712 01/07/20 1720  WBC 9.9  --   NEUTROABS 6.1  --   HGB 14.6 11.2*  HCT 40.7 33.0*  MCV 80.4  --   PLT 229  --    Cardiac Enzymes: No results for input(s): CKTOTAL, CKMB, CKMBINDEX, TROPONINI in the last 168 hours. BNP: Invalid input(s): POCBNP CBG: Recent Labs  Lab 01/08/20 0432 01/08/20 0635 01/08/20 0808 01/08/20 0951 01/08/20 1200  GLUCAP 119* 160* 168* 183* 139*   D-Dimer No results for input(s): DDIMER in the last 72 hours. Hgb A1c No results for input(s): HGBA1C in the last 72 hours. Lipid Profile No results for input(s): CHOL, HDL, LDLCALC, TRIG, CHOLHDL, LDLDIRECT in the last 72 hours. Thyroid function studies Recent Labs    01/05/20 1646  TSH 1.36   Anemia work up No results for input(s): VITAMINB12, FOLATE, FERRITIN, TIBC, IRON, RETICCTPCT in the last 72 hours. Urinalysis    Component Value Date/Time   COLORURINE STRAW (A) 01/07/2020 1756   APPEARANCEUR CLEAR 01/07/2020 1756   LABSPEC 1.029 01/07/2020 1756   PHURINE 6.0 01/07/2020 1756   GLUCOSEU >=500 (A) 01/07/2020 1756    HGBUR NEGATIVE 01/07/2020 1756   BILIRUBINUR NEGATIVE 01/07/2020 1756   KETONESUR NEGATIVE 01/07/2020 1756   PROTEINUR NEGATIVE 01/07/2020 1756   UROBILINOGEN 1.0 03/04/2014 2312   NITRITE NEGATIVE 01/07/2020 1756   LEUKOCYTESUR NEGATIVE 01/07/2020 1756   Sepsis Labs Invalid input(s): PROCALCITONIN,  WBC,  LACTICIDVEN Microbiology Recent Results (from the past 240 hour(s))  Respiratory Panel by RT PCR (Flu A&B, Covid) - Nasopharyngeal Swab     Status: None   Collection Time: 01/07/20  8:25 PM   Specimen: Nasopharyngeal Swab  Result Value Ref Range Status   SARS Coronavirus 2 by RT PCR NEGATIVE NEGATIVE Final    Comment: (NOTE) SARS-CoV-2 target nucleic acids are NOT DETECTED.  The SARS-CoV-2 RNA is generally detectable in upper respiratoy specimens during the acute phase of infection. The lowest concentration of SARS-CoV-2 viral copies this assay can detect is 131 copies/mL. A negative result does not preclude SARS-Cov-2 infection and should not be used as the sole basis for treatment or other patient management decisions. A negative result may occur with  improper specimen collection/handling, submission of specimen other than nasopharyngeal swab, presence of viral mutation(s) within the areas targeted by this assay, and inadequate number of viral copies (<131 copies/mL). A negative result must be combined with clinical observations, patient history, and epidemiological information. The expected result is Negative.  Fact Sheet for Patients:  PinkCheek.be  Fact Sheet for Healthcare Providers:  GravelBags.it  This test is no t yet approved or cleared by the [place] FDA and  has been authorized for detection and/or diagnosis of SARS-CoV-2 by FDA under an Emergency Use Authorization (EUA). This EUA will remain  in effect (meaning this test can be used) for the duration of the COVID-19 declaration under Section  564(b)(1) of the Act, 21 U.S.C. section 360bbb-3(b)(1), unless the authorization is terminated or revoked sooner.     Influenza A  by PCR NEGATIVE NEGATIVE Final   Influenza B by PCR NEGATIVE NEGATIVE Final    Comment: (NOTE) The Xpert Xpress SARS-CoV-2/FLU/RSV assay is intended as an aid in  the diagnosis of influenza from Nasopharyngeal swab specimens and  should not be used as a sole basis for treatment. Nasal washings and  aspirates are unacceptable for Xpert Xpress SARS-CoV-2/FLU/RSV  testing.  Fact Sheet for Patients: PinkCheek.be  Fact Sheet for Healthcare Providers: GravelBags.it  This test is not yet approved or cleared by the [place] FDA and  has been authorized for detection and/or diagnosis of SARS-CoV-2 by  FDA under an Emergency Use Authorization (EUA). This EUA will remain  in effect (meaning this test can be used) for the duration of the  Covid-19 declaration under Section 564(b)(1) of the Act, 21  U.S.C. section 360bbb-3(b)(1), unless the authorization is  terminated or revoked. Performed at [facility], [place] [address] [place], [place] [address]      Time coordinating discharge: 35 minutes  SIGNED:   Barb Merino, MD  [facility] 01/08/2020, 1:19 PM

## 2020-01-08 NOTE — Telephone Encounter (Signed)
I called back.  Pt is requiring a low dosage in the hospital, which strongly suggests he does not take as rx'ed at home.

## 2020-01-08 NOTE — ED Notes (Addendum)
Pt taken off IV insulin drip, and off of IV D5 in LR. Given SQ lantus coverage.

## 2020-01-08 NOTE — Telephone Encounter (Signed)
Patient is in the care of Dr Sloan Leiter right now at [facility] - Dr has asked to speak to you directly when you have a chance regarding this patient. Ph# [phone]

## 2020-01-09 LAB — HEMOGLOBIN A1C
Hgb A1c MFr Bld: >15.5 % — ABNORMAL HIGH (ref 4.8–5.6)
Mean Plasma Glucose: >398 mg/dL

## 2020-01-20 ENCOUNTER — Ambulatory Visit: Payer: No Typology Code available for payment source | Admitting: Endocrinology

## 2020-01-22 ENCOUNTER — Ambulatory Visit (INDEPENDENT_AMBULATORY_CARE_PROVIDER_SITE_OTHER): Payer: No Typology Code available for payment source | Admitting: Endocrinology

## 2020-01-22 ENCOUNTER — Encounter: Payer: Self-pay | Admitting: Endocrinology

## 2020-01-22 ENCOUNTER — Other Ambulatory Visit: Payer: Self-pay

## 2020-01-22 VITALS — BP 142/80 | HR 94 | Wt 220.0 lb

## 2020-01-22 DIAGNOSIS — E1165 Type 2 diabetes mellitus with hyperglycemia: Secondary | ICD-10-CM | POA: Diagnosis not present

## 2020-01-22 DIAGNOSIS — Z794 Long term (current) use of insulin: Secondary | ICD-10-CM | POA: Diagnosis not present

## 2020-01-22 LAB — POCT GLYCOSYLATED HEMOGLOBIN (HGB A1C): Hemoglobin A1C: 13 % — AB (ref 4.0–5.6)

## 2020-01-22 MED ORDER — TRULICITY 4.5 MG/0.5ML ~~LOC~~ SOAJ
4.5000 mg | SUBCUTANEOUS | 3 refills | Status: DC
Start: 2020-01-22 — End: 2021-07-28

## 2020-01-22 MED ORDER — TOUJEO MAX SOLOSTAR 300 UNIT/ML ~~LOC~~ SOPN
200.0000 [IU] | PEN_INJECTOR | SUBCUTANEOUS | 11 refills | Status: DC
Start: 2020-01-22 — End: 2020-02-25
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
## 2020-01-22 NOTE — Patient Instructions (Signed)
check your blood sugar twice a day.  vary the time of day when you check, between before the 3 meals, and at bedtime.  also check if you have symptoms of your blood sugar being too high or too low.  please keep a record of the readings and bring it to your next appointment here (or you can bring the meter itself).  You can write it on any piece of paper.  please call us sooner if your blood sugar goes below 70, or if you have a lot of readings over 200. Please continue the same Trulicity, Toujeo, and metformin.   Please come back for a follow-up appointment in 1 month.

## 2020-01-23 ENCOUNTER — Telehealth: Payer: Self-pay | Admitting: Endocrinology

## 2020-01-23 NOTE — Telephone Encounter (Signed)
Please resend Trulicity and Toujeo to [facility]  on [address]

## 2020-01-23 NOTE — Telephone Encounter (Signed)
Patient's pharmacy of record should be the [facility]  on [address]

## 2020-01-23 NOTE — Telephone Encounter (Signed)
Done Re-sent Rx

## 2020-01-28 ENCOUNTER — Telehealth (HOSPITAL_COMMUNITY): Payer: No Typology Code available for payment source | Admitting: Psychiatry

## 2020-01-28 ENCOUNTER — Other Ambulatory Visit: Payer: Self-pay

## 2020-01-28 ENCOUNTER — Ambulatory Visit (INDEPENDENT_AMBULATORY_CARE_PROVIDER_SITE_OTHER): Payer: No Typology Code available for payment source | Admitting: Podiatry

## 2020-01-28 ENCOUNTER — Encounter: Payer: Self-pay | Admitting: Podiatry

## 2020-01-28 DIAGNOSIS — Z794 Long term (current) use of insulin: Secondary | ICD-10-CM | POA: Diagnosis not present

## 2020-01-28 DIAGNOSIS — B351 Tinea unguium: Secondary | ICD-10-CM | POA: Diagnosis not present

## 2020-01-28 DIAGNOSIS — E1165 Type 2 diabetes mellitus with hyperglycemia: Secondary | ICD-10-CM

## 2020-01-28 DIAGNOSIS — M79674 Pain in right toe(s): Secondary | ICD-10-CM

## 2020-01-28 DIAGNOSIS — M79675 Pain in left toe(s): Secondary | ICD-10-CM

## 2020-01-28 NOTE — Progress Notes (Signed)
This patient presents  to my office for at risk foot care.  This patient requires this care by a professional since this patient will be at risk due to having uncontrolled type 2 diabetes,  Patient has been hospitalized due to blood sugar of 900.  This has led to multiple body ulcerations.    This patient is unable to cut nails himself since the patient cannot reach his nails.These nails are painful walking and wearing shoes.  This patient presents for at risk foot care today.  General Appearance  Alert, conversant and in no acute stress.  Vascular  Dorsalis pedis and posterior tibial  pulses are palpable  bilaterally.  Capillary return is within normal limits  bilaterally. Temperature is within normal limits  bilaterally.  Neurologic  Senn-Weinstein monofilament wire test absent   bilaterally. Muscle power within normal limits bilaterally.  Nails Thick disfigured discolored nails with subungual debris  from hallux to fifth toes bilaterally. No evidence of bacterial infection or drainage bilaterally.  Orthopedic  No limitations of motion  feet .  No crepitus or effusions noted.  No bony pathology or digital deformities noted.  HAV  B/L.  Skin  normotropic skin with no porokeratosis noted bilaterally.  No signs of infections or ulcers noted.     Onychomycosis  Pain in right toes  Pain in left toes  Consent was obtained for treatment procedures.   Mechanical debridement of nails 1-5  bilaterally performed with a nail nipper.  Filed with dremel without incident.    Return office visit  3 months                   Told patient to return for periodic foot care and evaluation due to potential at risk complications. Patient qualifies for diabetic shoes next year.   Gardiner Barefoot DPM

## 2020-01-29 ENCOUNTER — Encounter (HOSPITAL_COMMUNITY): Payer: Self-pay | Admitting: Psychiatry

## 2020-01-29 ENCOUNTER — Telehealth (INDEPENDENT_AMBULATORY_CARE_PROVIDER_SITE_OTHER): Payer: No Typology Code available for payment source | Admitting: Psychiatry

## 2020-01-29 DIAGNOSIS — F4312 Post-traumatic stress disorder, chronic: Secondary | ICD-10-CM | POA: Diagnosis not present

## 2020-01-29 DIAGNOSIS — F3181 Bipolar II disorder: Secondary | ICD-10-CM

## 2020-01-29 MED ORDER — TRAZODONE HCL 100 MG PO TABS: 100.0000 mg | ORAL_TABLET | Freq: Every day | ORAL | 2 refills | Status: DC

## 2020-01-29 MED ORDER — VENLAFAXINE HCL ER 150 MG PO CP24: 150.0000 mg | ORAL_CAPSULE | Freq: Every day | ORAL | 1 refills | Status: DC

## 2020-01-29 MED ORDER — ARIPIPRAZOLE 5 MG PO TABS: 5.0000 mg | ORAL_TABLET | Freq: Every day | ORAL | 1 refills | Status: DC

## 2020-01-29 NOTE — Progress Notes (Addendum)
Virtual Visit via Telephone Note  I connected with Vincent [name] on 01/29/20 at 10:20 AM EST by telephone and verified that I am speaking with the correct person using two identifiers.  Location: Patient: Home Provider: Home Office   I discussed the limitations, risks, security and privacy concerns of performing an evaluation and management service by telephone and the availability of in person appointments. I also discussed with the patient that there may be a patient responsible charge related to this service. The patient expressed understanding and agreed to proceed.   History of Present Illness: Patient is evaluated by phone session.  I also spoke to his wife with his permission who provided information about his psychiatric symptoms.  She reported patient is doing better with the medication.  Patient was recently admitted on the medical floor because of hyperglycemia and hypertension.  His hemoglobin A1c was more than 15 and last hemoglobin A1c was 13.  He also had a lot of weight gain.  Patient is feeling better.  He has multiple health issues including neuropathy, tingling, chronic pain, diabetes and hypertension.  He reported since medicine is adjusted he is doing better and his blood sugar and blood pressure.  He takes a lot of medication.  Patient and his wife does not want to change the Abilify, Effexor, mirtazapine doses because both feel that his mood, irritability and mania is much stable.  However promised that they will work on his diet and weight.  I told that Abilify can cause increased blood sugar and same with other psychotropic medication that they may contribute hypertension and increased appetite.  However patient and his wife insist that they are working on it.  He is actually looking forward for Thanksgiving.  His mood is good because he is seeing his grandkids every day.  He recently have 32-month-old grandchild and they also have 79-year-old grandchild.  Patient denies any  nightmares, flashback.  He is sleeping better since we added trazodone.  He has not able to go back to therapy because of COVID but he feels the current psychiatric medication is working well.  Past Psychiatric History:Reviewed. H/Oone brief hospitalization at [facility] due to suicidal thoughts. No h/osuicidal attempt. H/Oanger, anxiety, PTSD and heavy substance use. 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 Tried Depakote, Zoloft, lithium, Prozac and Xanax.  Recent Results (from the past 2160 hour(s))  POCT glycosylated hemoglobin (Hb A1C)     Status: None   Collection Time: 01/05/20  4:26 PM  Result Value Ref Range   Hemoglobin A1C      Comment: To high to result   HbA1c POC (<> result, manual entry)     HbA1c, POC (prediabetic range)     HbA1c, POC (controlled diabetic range)    TSH     Status: None   Collection Time: 01/05/20  4:46 PM  Result Value Ref Range   TSH 1.36 0.35 - 4.50 uIU/mL  Basic metabolic panel     Status: Abnormal   Collection Time: 01/05/20  4:46 PM  Result Value Ref Range   Sodium 126 (L) 135 - 145 mEq/L   Potassium 5.7 (H) 3.5 - 5.1 mEq/L   Chloride 89 (L) 96 - 112 mEq/L   CO2 27 19 - 32 mEq/L   Glucose, Bld 947 (HH) 70 - 99 mg/dL   BUN 28 (H) 6 - 23 mg/dL   Creatinine, Ser 1.29 0.40 - 1.50 mg/dL   GFR 63.73 >60.00 mL/min    Comment:  Calculated using the CKD-EPI Creatinine Equation (2021)   Calcium 9.6 8.4 - 10.5 mg/dL  Basic metabolic panel     Status: Abnormal   Collection Time: 01/07/20  2:01 PM  Result Value Ref Range   Sodium 123 (L) 135 - 145 mEq/L   Potassium 4.7 3.5 - 5.1 mEq/L   Chloride 87 (L) 96 - 112 mEq/L   CO2 27 19 - 32 mEq/L   Glucose, Bld 892 (HH) 70 - 99 mg/dL   BUN 26 (H) 6 - 23 mg/dL   Creatinine, Ser 1.07 0.40 - 1.50 mg/dL   GFR 79.76 >60.00 mL/min    Comment: Calculated using the CKD-EPI Creatinine Equation (2021)   Calcium 9.3 8.4 - 10.5 mg/dL  CBG monitoring, ED     Status: Abnormal   Collection Time:  01/07/20  4:30 PM  Result Value Ref Range   Glucose-Capillary >600 (HH) 70 - 99 mg/dL    Comment: Glucose reference range applies only to samples taken after fasting for at least 8 hours.  Basic metabolic panel     Status: Abnormal   Collection Time: 01/07/20  5:12 PM  Result Value Ref Range   Sodium 126 (L) 135 - 145 mmol/L   Potassium 4.4 3.5 - 5.1 mmol/L   Chloride 90 (L) 98 - 111 mmol/L   CO2 25 22 - 32 mmol/L   Glucose, Bld 699 (HH) 70 - 99 mg/dL    Comment: Glucose reference range applies only to samples taken after fasting for at least 8 hours. CRITICAL RESULT CALLED TO, READ BACK BY AND VERIFIED WITH: DOWD,P. RN @1826  01/07/20 BILLINGSLEY,L    BUN 28 (H) 6 - 20 mg/dL   Creatinine, Ser 0.94 0.61 - 1.24 mg/dL   Calcium 9.2 8.9 - 10.3 mg/dL   GFR, Estimated >60 >60 mL/min    Comment: (NOTE) Calculated using the CKD-EPI Creatinine Equation (2021)    Anion gap 11 5 - 15    Comment: Performed at [facility], [place] [address], [place], [place] [address]  CBC with Differential (PNL)     Status: None   Collection Time: 01/07/20  5:12 PM  Result Value Ref Range   WBC 9.9 4.0 - 10.5 K/uL   RBC 5.06 4.22 - 5.81 MIL/uL   Hemoglobin 14.6 13.0 - 17.0 g/dL   HCT 40.7 39 - 52 %   MCV 80.4 80.0 - 100.0 fL   MCH 28.9 26.0 - 34.0 pg   MCHC 35.9 30.0 - 36.0 g/dL   RDW 12.9 11.5 - 15.5 %   Platelets 229 150 - 400 K/uL   nRBC 0.0 0.0 - 0.2 %   Neutrophils Relative % 61 %   Neutro Abs 6.1 1.7 - 7.7 K/uL   Lymphocytes Relative 29 %   Lymphs Abs 2.9 0.7 - 4.0 K/uL   Monocytes Relative 5 %   Monocytes Absolute 0.5 0.1 - 1.0 K/uL   Eosinophils Relative 3 %   Eosinophils Absolute 0.3 0.0 - 0.5 K/uL   Basophils Relative 1 %   Basophils Absolute 0.1 0.0 - 0.1 K/uL   Immature Granulocytes 1 %   Abs Immature Granulocytes 0.05 0.00 - 0.07 K/uL    Comment: Performed at [facility], [place] [address], [place], [place] [address]  Beta-hydroxybutyric acid      Status: None   Collection Time: 01/07/20  5:12 PM  Result Value Ref Range   Beta-Hydroxybutyric Acid 0.13 0.05 - 0.27 mmol/L    Comment: Performed  at [facility], [place] [address], [place], [place] [address]  Ginger Carne 8, ED     Status: Abnormal   Collection Time: 01/07/20  5:20 PM  Result Value Ref Range   Sodium 135 135 - 145 mmol/L   Potassium 3.3 (L) 3.5 - 5.1 mmol/L   Chloride 99 98 - 111 mmol/L   BUN 20 6 - 20 mg/dL   Creatinine, Ser 0.40 (L) 0.61 - 1.24 mg/dL   Glucose, Bld 590 (HH) 70 - 99 mg/dL    Comment: Glucose reference range applies only to samples taken after fasting for at least 8 hours.   Calcium, Ion 1.05 (L) 1.15 - 1.40 mmol/L   TCO2 20 (L) 22 - 32 mmol/L   Hemoglobin 11.2 (L) 13.0 - 17.0 g/dL   HCT 33.0 (L) 39 - 52 %  Urinalysis, Routine w reflex microscopic     Status: Abnormal   Collection Time: 01/07/20  5:56 PM  Result Value Ref Range   Color, Urine STRAW (A) YELLOW   APPearance CLEAR CLEAR   Specific Gravity, Urine 1.029 1.005 - 1.030   pH 6.0 5.0 - 8.0   Glucose, UA >=500 (A) NEGATIVE mg/dL   Hgb urine dipstick NEGATIVE NEGATIVE   Bilirubin Urine NEGATIVE NEGATIVE   Ketones, ur NEGATIVE NEGATIVE mg/dL   Protein, ur NEGATIVE NEGATIVE mg/dL   Nitrite NEGATIVE NEGATIVE   Leukocytes,Ua NEGATIVE NEGATIVE   RBC / HPF 0-5 0 - 5 RBC/hpf   WBC, UA 0-5 0 - 5 WBC/hpf   Bacteria, UA NONE SEEN NONE SEEN    Comment: Performed at [facility], [place] [address], [place],  [address]  CBG monitoring, ED     Status: Abnormal   Collection Time: 01/07/20  7:19 PM  Result Value Ref Range   Glucose-Capillary 330 (H) 70 - 99 mg/dL    Comment: Glucose reference range applies only to samples taken after fasting for at least 8 hours.  CBG monitoring, ED     Status: Abnormal   Collection Time: 01/07/20  8:20 PM  Result Value Ref Range   Glucose-Capillary 309 (H) 70 - 99 mg/dL    Comment: Glucose reference range applies only to  samples taken after fasting for at least 8 hours.  Respiratory Panel by RT PCR (Flu A&B, Covid) - Nasopharyngeal Swab     Status: None   Collection Time: 01/07/20  8:25 PM   Specimen: Nasopharyngeal Swab  Result Value Ref Range   SARS Coronavirus 2 by RT PCR NEGATIVE NEGATIVE    Comment: (NOTE) SARS-CoV-2 target nucleic acids are NOT DETECTED.  The SARS-CoV-2 RNA is generally detectable in upper respiratoy specimens during the acute phase of infection. The lowest concentration of SARS-CoV-2 viral copies this assay can detect is 131 copies/mL. A negative result does not preclude SARS-Cov-2 infection and should not be used as the sole basis for treatment or other patient management decisions. A negative result may occur with  improper specimen collection/handling, submission of specimen other than nasopharyngeal swab, presence of viral mutation(s) within the areas targeted by this assay, and inadequate number of viral copies (<131 copies/mL). A negative result must be combined with clinical observations, patient history, and epidemiological information. The expected result is Negative.  Fact Sheet for Patients:  PinkCheek.be  Fact Sheet for Healthcare Providers:  GravelBags.it  This test is no t yet approved or cleared by the [place] FDA and  has been authorized for detection and/or diagnosis of SARS-CoV-2 by  FDA under an Emergency Use Authorization (EUA). This EUA will remain  in effect (meaning this test can be used) for the duration of the COVID-19 declaration under Section 564(b)(1) of the Act, 21 U.S.C. section 360bbb-3(b)(1), unless the authorization is terminated or revoked sooner.     Influenza A by PCR NEGATIVE NEGATIVE   Influenza B by PCR NEGATIVE NEGATIVE    Comment: (NOTE) The Xpert Xpress SARS-CoV-2/FLU/RSV assay is intended as an aid in  the diagnosis of influenza from Nasopharyngeal swab specimens  and  should not be used as a sole basis for treatment. Nasal washings and  aspirates are unacceptable for Xpert Xpress SARS-CoV-2/FLU/RSV  testing.  Fact Sheet for Patients: PinkCheek.be  Fact Sheet for Healthcare Providers: GravelBags.it  This test is not yet approved or cleared by the [place] FDA and  has been authorized for detection and/or diagnosis of SARS-CoV-2 by  FDA under an Emergency Use Authorization (EUA). This EUA will remain  in effect (meaning this test can be used) for the duration of the  Covid-19 declaration under Section 564(b)(1) of the Act, 21  U.S.C. section 360bbb-3(b)(1), unless the authorization is  terminated or revoked. Performed at [facility], [place] [address] [place], [place] [address]   CBG monitoring, ED     Status: Abnormal   Collection Time: 01/07/20  9:30 PM  Result Value Ref Range   Glucose-Capillary 252 (H) 70 - 99 mg/dL    Comment: Glucose reference range applies only to samples taken after fasting for at least 8 hours.  CBG monitoring, ED     Status: Abnormal   Collection Time: 01/07/20 10:30 PM  Result Value Ref Range   Glucose-Capillary 205 (H) 70 - 99 mg/dL    Comment: Glucose reference range applies only to samples taken after fasting for at least 8 hours.  CBG monitoring, ED     Status: Abnormal   Collection Time: 01/07/20 11:44 PM  Result Value Ref Range   Glucose-Capillary 182 (H) 70 - 99 mg/dL    Comment: Glucose reference range applies only to samples taken after fasting for at least 8 hours.  Basic metabolic panel     Status: Abnormal   Collection Time: 01/08/20 12:01 AM  Result Value Ref Range   Sodium 137 135 - 145 mmol/L   Potassium 3.0 (L) 3.5 - 5.1 mmol/L   Chloride 99 98 - 111 mmol/L   CO2 26 22 - 32 mmol/L   Glucose, Bld 170 (H) 70 - 99 mg/dL    Comment: Glucose reference range applies only to samples taken after fasting for at least 8  hours.   BUN 22 (H) 6 - 20 mg/dL   Creatinine, Ser 0.75 0.61 - 1.24 mg/dL   Calcium 9.3 8.9 - 10.3 mg/dL   GFR, Estimated >60 >60 mL/min    Comment: (NOTE) Calculated using the CKD-EPI Creatinine Equation (2021)    Anion gap 12 5 - 15    Comment: Performed at [facility], [place] [address] [place], [place] [address]  CBG monitoring, ED     Status: Abnormal   Collection Time: 01/08/20 12:58 AM  Result Value Ref Range   Glucose-Capillary 172 (H) 70 - 99 mg/dL    Comment: Glucose reference range applies only to samples taken after fasting for at least 8 hours.  Hemoglobin A1c     Status: Abnormal   Collection Time: 01/08/20  1:00 AM  Result Value Ref Range   Hgb A1c MFr Bld >  15.5 (H) 4.8 - 5.6 %    Comment: (NOTE) **Verified by repeat analysis**         Prediabetes: 5.7 - 6.4         Diabetes: >6.4         Glycemic control for adults with diabetes: <7.0    Mean Plasma Glucose >398 mg/dL    Comment: (NOTE) Performed At: [facility] [place], [place] [id number] Rush Farmer MD TL:[id number]   CBG monitoring, ED     Status: Abnormal   Collection Time: 01/08/20  2:13 AM  Result Value Ref Range   Glucose-Capillary 133 (H) 70 - 99 mg/dL    Comment: Glucose reference range applies only to samples taken after fasting for at least 8 hours.  CBG monitoring, ED     Status: Abnormal   Collection Time: 01/08/20  3:22 AM  Result Value Ref Range   Glucose-Capillary 128 (H) 70 - 99 mg/dL    Comment: Glucose reference range applies only to samples taken after fasting for at least 8 hours.  CBG monitoring, ED     Status: Abnormal   Collection Time: 01/08/20  4:32 AM  Result Value Ref Range   Glucose-Capillary 119 (H) 70 - 99 mg/dL    Comment: Glucose reference range applies only to samples taken after fasting for at least 8 hours.  CBG monitoring, ED     Status: Abnormal   Collection Time: 01/08/20  6:35 AM  Result Value Ref Range    Glucose-Capillary 160 (H) 70 - 99 mg/dL    Comment: Glucose reference range applies only to samples taken after fasting for at least 8 hours.  Basic metabolic panel     Status: Abnormal   Collection Time: 01/08/20  6:38 AM  Result Value Ref Range   Sodium 134 (L) 135 - 145 mmol/L   Potassium 4.4 3.5 - 5.1 mmol/L    Comment: DELTA CHECK NOTED NO VISIBLE HEMOLYSIS    Chloride 101 98 - 111 mmol/L   CO2 25 22 - 32 mmol/L   Glucose, Bld 175 (H) 70 - 99 mg/dL    Comment: Glucose reference range applies only to samples taken after fasting for at least 8 hours.   BUN 20 6 - 20 mg/dL   Creatinine, Ser 0.75 0.61 - 1.24 mg/dL   Calcium 8.8 (L) 8.9 - 10.3 mg/dL   GFR, Estimated >60 >60 mL/min    Comment: (NOTE) Calculated using the CKD-EPI Creatinine Equation (2021)    Anion gap 8 5 - 15    Comment: Performed at [facility], [place] [address], [place], [place] [address]  CBG monitoring, ED     Status: Abnormal   Collection Time: 01/08/20  8:08 AM  Result Value Ref Range   Glucose-Capillary 168 (H) 70 - 99 mg/dL    Comment: Glucose reference range applies only to samples taken after fasting for at least 8 hours.  CBG monitoring, ED     Status: Abnormal   Collection Time: 01/08/20  9:51 AM  Result Value Ref Range   Glucose-Capillary 183 (H) 70 - 99 mg/dL    Comment: Glucose reference range applies only to samples taken after fasting for at least 8 hours.  CBG monitoring, ED     Status: Abnormal   Collection Time: 01/08/20 12:00 PM  Result Value Ref Range   Glucose-Capillary 139 (H) 70 - 99 mg/dL    Comment: Glucose reference range applies only to samples taken after fasting  for at least 8 hours.  POCT glycosylated hemoglobin (Hb A1C)     Status: Abnormal   Collection Time: 01/22/20  2:04 PM  Result Value Ref Range   Hemoglobin A1C 13.0 (A) 4.0 - 5.6 %   HbA1c POC (<> result, manual entry)     HbA1c, POC (prediabetic range)     HbA1c, POC (controlled diabetic range)        Psychiatric Specialty Exam: Physical Exam  Review of Systems  Weight 220 lb (99.8 kg).There is no height or weight on file to calculate BMI.  General Appearance: NA  Eye Contact:  NA  Speech:  Slow  Volume:  Decreased  Mood:  Euthymic  Affect:  NA  Thought Process:  Descriptions of Associations: Intact  Orientation:  Full (Time, Place, and Person)  Thought Content:  WDL  Suicidal Thoughts:  No  Homicidal Thoughts:  No  Memory:  Immediate;   Good Recent;   Fair Remote;   Fair  Judgement:  Intact  Insight:  Present  Psychomotor Activity:  NA  Concentration:  Concentration: Fair and Attention Span: Fair  Recall:  [employer] of Knowledge:  Fair  Language:  Good  Akathisia:  No  Handed:  Right  AIMS (if indicated):     Assets:  Communication Skills Desire for Improvement Housing Social Support  ADL's:  Intact  Cognition:  WNL  Sleep:   6 hrs      Assessment and Plan: Bipolar disorder type II.  PTSD.  I reviewed blood work results, current medication.  Patient does not want to change the medication and like to keep his medication.  I will continue Abilify 5 mg daily,trazodone 100 mg at bedtime and Effexor 150 mg daily.  His mirtazapine was discontinued on his last admission.  I reviewed treatment plan with the patient and his wife.  He is taking Lamictal from his neurology for seizures.  We may consider lowering the dose of Abilify if his blood sugar remains high.  Recommended to call us back if he has any question or any concern.  Follow-up in 3 months.  Follow Up Instructions:    I discussed the assessment and treatment plan with the patient. The patient was provided an opportunity to ask questions and all were answered. The patient agreed with the plan and demonstrated an understanding of the instructions.   The patient was advised to call back or seek an in-person evaluation if the symptoms worsen or if the condition fails to improve as anticipated.  I  provided 21 minutes of non-face-to-face time during this encounter.   Kathlee Nations, MD

## 2020-02-25 ENCOUNTER — Telehealth: Payer: Self-pay | Admitting: Endocrinology

## 2020-02-25 ENCOUNTER — Other Ambulatory Visit: Payer: Self-pay

## 2020-02-25 ENCOUNTER — Telehealth (HOSPITAL_COMMUNITY): Payer: Self-pay | Admitting: *Deleted

## 2020-02-25 ENCOUNTER — Encounter: Payer: Self-pay | Admitting: Endocrinology

## 2020-02-25 ENCOUNTER — Ambulatory Visit (INDEPENDENT_AMBULATORY_CARE_PROVIDER_SITE_OTHER): Payer: No Typology Code available for payment source | Admitting: Endocrinology

## 2020-02-25 VITALS — BP 138/86 | HR 72 | Ht 72.0 in | Wt 209.0 lb

## 2020-02-25 DIAGNOSIS — E1142 Type 2 diabetes mellitus with diabetic polyneuropathy: Secondary | ICD-10-CM

## 2020-02-25 MED ORDER — TOUJEO MAX SOLOSTAR 300 UNIT/ML ~~LOC~~ SOPN
180.0000 [IU] | PEN_INJECTOR | SUBCUTANEOUS | 11 refills | Status: DC
Start: 2020-02-25 — End: 2021-07-28

## 2020-02-25 MED ORDER — FREESTYLE LIBRE 2 READER DEVI: 1.0000 | Freq: Once | 1 refills | Status: AC

## 2020-02-25 MED ORDER — TOUJEO MAX SOLOSTAR 300 UNIT/ML ~~LOC~~ SOPN
180.0000 [IU] | PEN_INJECTOR | SUBCUTANEOUS | 11 refills | Status: DC
Start: 2020-02-25 — End: 2020-02-25

## 2020-02-25 MED ORDER — FREESTYLE LIBRE 2 SENSOR MISC: 1.0000 | 3 refills | Status: DC

## 2020-02-25 NOTE — Telephone Encounter (Signed)
Notified pt wife--called [facility] [phone]---review  freestyle [place] reference [id number]

## 2020-02-25 NOTE — Telephone Encounter (Signed)
Writer spoke with pt who has c/o extreme anxiety recently. Most often when he is in public spaces. Pt is requesting something to help with this. Pt next appointment not until 04/26/20. Please review. Thanks.

## 2020-02-25 NOTE — Telephone Encounter (Signed)
Pharmacy called and stated that patient wants [place] to call [phone] so that we can help him get the Free Style Libre at a reduced cost.  Pharmacy did not know who or what this number is for and I let them know I would pass the message along, but that I was not certain what if anything could be done.

## 2020-02-25 NOTE — Patient Instructions (Addendum)
check your blood sugar twice a day.  vary the time of day when you check, between before the 3 meals, and at bedtime.  also check if you have symptoms of your blood sugar being too high or too low.  please keep a record of the readings and bring it to your next appointment here (or you can bring the meter itself).  You can write it on any piece of paper.  please call us sooner if your blood sugar goes below 70, or if you have a lot of readings over 200.  Please increase the Toujeo to 180 units each morning, and continue the same Trulicity, and metformin.   I have sent a prescription to your pharmacy, for the continuous glucose monitor.  Please call if you need help with this.    Please come back for a follow-up appointment in 6 weeks.
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
## 2020-02-26 NOTE — Telephone Encounter (Signed)
Patient is taking multiple medication and concern of his general health.  His blood sugar is very high.  I do not want to add more medication.  He is taking Lyrica, gabapentin and he may consider talking to the provider who are giving these medicine to adjust to help his anxiety.

## 2020-03-17 DIAGNOSIS — K3184 Gastroparesis: Secondary | ICD-10-CM | POA: Diagnosis not present

## 2020-03-17 DIAGNOSIS — E1165 Type 2 diabetes mellitus with hyperglycemia: Secondary | ICD-10-CM | POA: Diagnosis not present

## 2020-03-17 DIAGNOSIS — R112 Nausea with vomiting, unspecified: Secondary | ICD-10-CM | POA: Diagnosis not present

## 2020-03-19 ENCOUNTER — Other Ambulatory Visit: Payer: Self-pay | Admitting: Urology

## 2020-03-24 ENCOUNTER — Inpatient Hospital Stay (HOSPITAL_COMMUNITY): Admission: RE | Admit: 2020-03-24 | Payer: No Typology Code available for payment source | Source: Ambulatory Visit

## 2020-03-25 ENCOUNTER — Other Ambulatory Visit (HOSPITAL_COMMUNITY)
Admission: RE | Admit: 2020-03-25 | Discharge: 2020-03-25 | Disposition: A | Discharge status: Home / Self Care | Payer: No Typology Code available for payment source | Source: Ambulatory Visit | Attending: Urology | Admitting: Urology

## 2020-03-25 DIAGNOSIS — Z01812 Encounter for preprocedural laboratory examination: Secondary | ICD-10-CM | POA: Diagnosis not present

## 2020-03-25 DIAGNOSIS — Z20822 Contact with and (suspected) exposure to covid-19: Secondary | ICD-10-CM | POA: Diagnosis not present

## 2020-03-25 LAB — SARS CORONAVIRUS 2 (TAT 6-24 HRS): SARS Coronavirus 2: NEGATIVE

## 2020-03-26 ENCOUNTER — Encounter (HOSPITAL_BASED_OUTPATIENT_CLINIC_OR_DEPARTMENT_OTHER): Payer: Self-pay | Admitting: Urology

## 2020-03-26 ENCOUNTER — Other Ambulatory Visit: Payer: Self-pay

## 2020-03-26 NOTE — Progress Notes (Signed)
Spoke w/ via phone for pre-op interview--- Pt's wife, Vincent [name]  (pt poor historian) Lab needs dos---- [employer] results------ current ekg in epic/ chart COVID test ------ done 03-25-2020 negative result in epic Arrive at ------- 1100 on 03-29-2020 NPO after MN NO Solid Food.  Clear liquids from MN until--- 1000 Medications to take morning of surgery ----- Prilosec, Synthyroid, Reglan, MS contin, MSIR, Imdur, Gabapentin, Lamictal, Lyrica, Effexor, Abilify, Crestor, Fenofibrate Diabetic medication ----- do not take metformin morning of surgery and do not to toujeo insulin morning of surgery Patient Special Instructions ----- n/a Pre-Op special Istructions ----- pt will need wife in pre-op Patient's wife verbalized understanding of instructions that were given at this phone interview. Patient's wife stated pt does not have shortness of breath, chest pain, fever, cough at this phone interview.   Anesthesia :  COPD;  Chronic angina; CAD w. Moderate disease RCA per CTA;  Transient seizures (wife stated last one 10/ 2021);  DM2;  Hx TIA 2008/ 08/ 2020 w/ no residual's;  Mild OSA , no cpap: Gastroparesis;  RA;  Bipolar 2;  Chronic PTSD.  Pt wife stated pt denies any cardiac s&s, sob, and no peripheral swelling.  PCP:  Dr Raliegh Ip. [place] Cardiologist :  Dr Percival Spanish [facility] 01-31-2018 epic) Rhematologist:  [place] Neurologist:  Dr Krista Blue (lov 04-24-2019 epic) GI:  Dr Henrene Pastor (lov 06-05-2019 epic) [facility] BH:  Dr Chauncey Cruel. Arfeen (lov 11-27-2019 epic) Chest x-ray :  CT angio chest 10-19-2018 epic Coronary CTA:  12-23-2017 epic EKG :  01-07-2020 epic Echo :  10-20-2018 epic Stress test:  no Cardiac Cath : 2011 in epic Activity level:  Denies sob w/ activity Sleep Study/ CPAP :  YES/  NO Fasting Blood Sugar :      / Checks Blood Sugar -- times a day: pt does not check at home Blood Thinner/ Instructions Maryjane Hurter Dose:  NO ASA / Instructions/ Last Dose :  NO

## 2020-03-29 ENCOUNTER — Ambulatory Visit (HOSPITAL_BASED_OUTPATIENT_CLINIC_OR_DEPARTMENT_OTHER): Payer: No Typology Code available for payment source | Admitting: Anesthesiology

## 2020-03-29 ENCOUNTER — Encounter (HOSPITAL_BASED_OUTPATIENT_CLINIC_OR_DEPARTMENT_OTHER)
Admission: RE | Disposition: A | Discharge status: Home / Self Care | Payer: Self-pay | Source: Home / Self Care | Attending: Urology

## 2020-03-29 ENCOUNTER — Ambulatory Visit (HOSPITAL_BASED_OUTPATIENT_CLINIC_OR_DEPARTMENT_OTHER)
Admission: RE | Admit: 2020-03-29 | Discharge: 2020-03-29 | Disposition: A | Discharge status: Home / Self Care | Payer: No Typology Code available for payment source | Attending: Urology | Admitting: Urology

## 2020-03-29 ENCOUNTER — Encounter (HOSPITAL_BASED_OUTPATIENT_CLINIC_OR_DEPARTMENT_OTHER): Payer: Self-pay | Admitting: Urology

## 2020-03-29 DIAGNOSIS — R350 Frequency of micturition: Secondary | ICD-10-CM | POA: Diagnosis not present

## 2020-03-29 DIAGNOSIS — Z794 Long term (current) use of insulin: Secondary | ICD-10-CM | POA: Diagnosis not present

## 2020-03-29 DIAGNOSIS — Z9079 Acquired absence of other genital organ(s): Secondary | ICD-10-CM | POA: Diagnosis not present

## 2020-03-29 DIAGNOSIS — Z882 Allergy status to sulfonamides status: Secondary | ICD-10-CM | POA: Diagnosis not present

## 2020-03-29 DIAGNOSIS — E291 Testicular hypofunction: Secondary | ICD-10-CM | POA: Diagnosis not present

## 2020-03-29 DIAGNOSIS — Z886 Allergy status to analgesic agent status: Secondary | ICD-10-CM | POA: Diagnosis not present

## 2020-03-29 DIAGNOSIS — Z79899 Other long term (current) drug therapy: Secondary | ICD-10-CM | POA: Insufficient documentation

## 2020-03-29 DIAGNOSIS — N35912 Unspecified bulbous urethral stricture, male: Secondary | ICD-10-CM | POA: Diagnosis not present

## 2020-03-29 DIAGNOSIS — Z8673 Personal history of transient ischemic attack (TIA), and cerebral infarction without residual deficits: Secondary | ICD-10-CM | POA: Insufficient documentation

## 2020-03-29 DIAGNOSIS — F17210 Nicotine dependence, cigarettes, uncomplicated: Secondary | ICD-10-CM | POA: Insufficient documentation

## 2020-03-29 DIAGNOSIS — N401 Enlarged prostate with lower urinary tract symptoms: Secondary | ICD-10-CM | POA: Diagnosis not present

## 2020-03-29 DIAGNOSIS — Z885 Allergy status to narcotic agent status: Secondary | ICD-10-CM | POA: Diagnosis not present

## 2020-03-29 DIAGNOSIS — Z79890 Hormone replacement therapy (postmenopausal): Secondary | ICD-10-CM | POA: Insufficient documentation

## 2020-03-29 DIAGNOSIS — C679 Malignant neoplasm of bladder, unspecified: Secondary | ICD-10-CM | POA: Diagnosis present

## 2020-03-29 DIAGNOSIS — R3912 Poor urinary stream: Secondary | ICD-10-CM | POA: Diagnosis not present

## 2020-03-29 DIAGNOSIS — C678 Malignant neoplasm of overlapping sites of bladder: Secondary | ICD-10-CM | POA: Diagnosis not present

## 2020-03-29 HISTORY — DX: Essential tremor: G25.0

## 2020-03-29 HISTORY — DX: Bipolar II disorder: F31.81

## 2020-03-29 HISTORY — DX: Obstructive sleep apnea (adult) (pediatric): G47.33

## 2020-03-29 HISTORY — PX: TRANSURETHRAL RESECTION OF BLADDER TUMOR: SHX2575

## 2020-03-29 HISTORY — DX: Testicular hypofunction: E29.1

## 2020-03-29 LAB — POCT I-STAT, CHEM 8
BUN: 18 mg/dL (ref 6–20)
Calcium, Ion: 1.27 mmol/L (ref 1.15–1.40)
Chloride: 107 mmol/L (ref 98–111)
Creatinine, Ser: 0.9 mg/dL (ref 0.61–1.24)
Glucose, Bld: 216 mg/dL — ABNORMAL HIGH (ref 70–99)
HCT: 35 % — ABNORMAL LOW (ref 39.0–52.0)
Hemoglobin: 11.9 g/dL — ABNORMAL LOW (ref 13.0–17.0)
Potassium: 4.2 mmol/L (ref 3.5–5.1)
Sodium: 142 mmol/L (ref 135–145)
TCO2: 24 mmol/L (ref 22–32)

## 2020-03-29 LAB — GLUCOSE, CAPILLARY: Glucose-Capillary: 72 mg/dL (ref 70–99)

## 2020-03-29 SURGERY — TURBT (TRANSURETHRAL RESECTION OF BLADDER TUMOR)
Anesthesia: General | Site: Bladder

## 2020-03-29 MED ORDER — SODIUM CHLORIDE 0.9 % IR SOLN
Status: DC | PRN
  Administered 2020-03-29: 500 mL

## 2020-03-29 MED ORDER — FENTANYL CITRATE (PF) 100 MCG/2ML IJ SOLN
INTRAMUSCULAR | Status: AC
  Filled 2020-03-29: qty 2

## 2020-03-29 MED ORDER — ACETAMINOPHEN 10 MG/ML IV SOLN: 1000.0000 mg | Freq: Once | INTRAVENOUS | Status: DC | PRN

## 2020-03-29 MED ORDER — LACTATED RINGERS IV SOLN: INTRAVENOUS | Status: DC

## 2020-03-29 MED ORDER — PROPOFOL 10 MG/ML IV BOLUS
INTRAVENOUS | Status: AC
  Filled 2020-03-29: qty 20

## 2020-03-29 MED ORDER — DEXAMETHASONE SODIUM PHOSPHATE 10 MG/ML IJ SOLN
INTRAMUSCULAR | Status: AC
  Filled 2020-03-29: qty 1

## 2020-03-29 MED ORDER — MIDAZOLAM HCL 2 MG/2ML IJ SOLN
INTRAMUSCULAR | Status: AC
  Filled 2020-03-29: qty 2

## 2020-03-29 MED ORDER — PROPOFOL 10 MG/ML IV BOLUS
INTRAVENOUS | Status: DC | PRN
  Administered 2020-03-29: 130 mg via INTRAVENOUS

## 2020-03-29 MED ORDER — OXYCODONE HCL 5 MG PO TABS
5.0000 mg | ORAL_TABLET | Freq: Once | ORAL | Status: DC | PRN
Start: 2020-03-29 — End: 2020-03-29

## 2020-03-29 MED ORDER — LIDOCAINE HCL (PF) 2 % IJ SOLN
INTRAMUSCULAR | Status: AC
  Filled 2020-03-29: qty 5

## 2020-03-29 MED ORDER — AMISULPRIDE (ANTIEMETIC) 5 MG/2ML IV SOLN: 10.0000 mg | Freq: Once | INTRAVENOUS | Status: DC | PRN

## 2020-03-29 MED ORDER — CEFAZOLIN SODIUM-DEXTROSE 2-4 GM/100ML-% IV SOLN
INTRAVENOUS | Status: AC
  Filled 2020-03-29: qty 100

## 2020-03-29 MED ORDER — ONDANSETRON HCL 4 MG/2ML IJ SOLN
INTRAMUSCULAR | Status: DC | PRN
  Administered 2020-03-29: 4 mg via INTRAVENOUS

## 2020-03-29 MED ORDER — STERILE WATER FOR IRRIGATION IR SOLN
Status: DC | PRN
  Administered 2020-03-29: 500 mL

## 2020-03-29 MED ORDER — FENTANYL CITRATE (PF) 100 MCG/2ML IJ SOLN
25.0000 ug | INTRAMUSCULAR | Status: DC | PRN
  Administered 2020-03-29 (×2): 50 ug via INTRAVENOUS

## 2020-03-29 MED ORDER — SODIUM CHLORIDE 0.9 % IR SOLN
Status: DC | PRN
  Administered 2020-03-29: 6000 mL

## 2020-03-29 MED ORDER — CEFAZOLIN SODIUM-DEXTROSE 2-4 GM/100ML-% IV SOLN
2.0000 g | Freq: Once | INTRAVENOUS | Status: AC
  Administered 2020-03-29: 2 g via INTRAVENOUS

## 2020-03-29 MED ORDER — ONDANSETRON HCL 4 MG/2ML IJ SOLN
INTRAMUSCULAR | Status: AC
  Filled 2020-03-29: qty 2

## 2020-03-29 MED ORDER — OXYCODONE HCL 5 MG/5ML PO SOLN
5.0000 mg | Freq: Once | ORAL | Status: DC | PRN
Start: 2020-03-29 — End: 2020-03-29

## 2020-03-29 MED ORDER — MEPERIDINE HCL 25 MG/ML IJ SOLN: 6.2500 mg | INTRAMUSCULAR | Status: DC | PRN

## 2020-03-29 MED ORDER — ACETAMINOPHEN 160 MG/5ML PO SOLN: 325.0000 mg | Freq: Once | ORAL | Status: DC | PRN

## 2020-03-29 MED ORDER — LIDOCAINE 2% (20 MG/ML) 5 ML SYRINGE
INTRAMUSCULAR | Status: DC | PRN
  Administered 2020-03-29: 40 mg via INTRAVENOUS

## 2020-03-29 MED ORDER — PHENYLEPHRINE 40 MCG/ML (10ML) SYRINGE FOR IV PUSH (FOR BLOOD PRESSURE SUPPORT)
PREFILLED_SYRINGE | INTRAVENOUS | Status: AC
  Filled 2020-03-29: qty 10

## 2020-03-29 MED ORDER — DEXAMETHASONE SODIUM PHOSPHATE 10 MG/ML IJ SOLN
INTRAMUSCULAR | Status: DC | PRN
  Administered 2020-03-29: 5 mg via INTRAVENOUS

## 2020-03-29 MED ORDER — FENTANYL CITRATE (PF) 100 MCG/2ML IJ SOLN
INTRAMUSCULAR | Status: DC | PRN
  Administered 2020-03-29: 50 ug via INTRAVENOUS
  Administered 2020-03-29: 25 ug via INTRAVENOUS
  Administered 2020-03-29: 50 ug via INTRAVENOUS

## 2020-03-29 MED ORDER — MIDAZOLAM HCL 5 MG/5ML IJ SOLN
INTRAMUSCULAR | Status: DC | PRN
  Administered 2020-03-29: 1 mg via INTRAVENOUS

## 2020-03-29 MED ORDER — PHENYLEPHRINE 40 MCG/ML (10ML) SYRINGE FOR IV PUSH (FOR BLOOD PRESSURE SUPPORT)
PREFILLED_SYRINGE | INTRAVENOUS | Status: DC | PRN
  Administered 2020-03-29: 80 ug via INTRAVENOUS
  Administered 2020-03-29: 40 ug via INTRAVENOUS
  Administered 2020-03-29: 80 ug via INTRAVENOUS
  Administered 2020-03-29: 120 ug via INTRAVENOUS
  Administered 2020-03-29: 80 ug via INTRAVENOUS

## 2020-03-29 MED ORDER — ACETAMINOPHEN 325 MG PO TABS
325.0000 mg | ORAL_TABLET | Freq: Once | ORAL | Status: DC | PRN
Start: 2020-03-29 — End: 2020-03-29

## 2020-03-29 MED ORDER — GEMCITABINE CHEMO FOR BLADDER INSTILLATION 2000 MG
2000.0000 mg | Freq: Once | INTRAVENOUS | Status: AC
  Administered 2020-03-29: 2000 mg via INTRAVESICAL
  Filled 2020-03-29: qty 2000

## 2020-03-29 SURGICAL SUPPLY — 31 items
BAG DRAIN URO-CYSTO SKYTR STRL (DRAIN) ×2 IMPLANT
BAG DRN RND TRDRP ANRFLXCHMBR (UROLOGICAL SUPPLIES)
BAG DRN UROCATH (DRAIN) ×1
BAG URINE DRAIN 2000ML AR STRL (UROLOGICAL SUPPLIES) IMPLANT
BAG URINE LEG 500ML (DRAIN) IMPLANT
CATH FOLEY 2WAY SLVR  5CC 16FR (CATHETERS) ×2
CATH FOLEY 2WAY SLVR  5CC 22FR (CATHETERS)
CATH FOLEY 2WAY SLVR 30CC 20FR (CATHETERS) IMPLANT
CATH FOLEY 2WAY SLVR 5CC 16FR (CATHETERS) IMPLANT
CATH FOLEY 2WAY SLVR 5CC 22FR (CATHETERS) IMPLANT
CATH SET URETHRAL DILATOR (CATHETERS) IMPLANT
CATH URET 5FR 28IN OPEN ENDED (CATHETERS) IMPLANT
CLOTH BEACON ORANGE TIMEOUT ST (SAFETY) ×2 IMPLANT
ELECT REM PT RETURN 9FT ADLT (ELECTROSURGICAL) ×2
ELECTRODE REM PT RTRN 9FT ADLT (ELECTROSURGICAL) ×1 IMPLANT
EVACUATOR MICROVAS BLADDER (UROLOGICAL SUPPLIES) IMPLANT
GLOVE SURG ENC MOIS LTX SZ7 (GLOVE) IMPLANT
GOWN STRL REUS W/ TWL LRG LVL3 (GOWN DISPOSABLE) ×1 IMPLANT
GOWN STRL REUS W/TWL LRG LVL3 (GOWN DISPOSABLE) ×2
GUIDEWIRE ZIPWRE .038 STRAIGHT (WIRE) IMPLANT
IV NS 500ML (IV SOLUTION) ×2
IV NS 500ML BAXH (IV SOLUTION) IMPLANT
IV NS IRRIG 3000ML ARTHROMATIC (IV SOLUTION) ×3 IMPLANT
KIT TURNOVER CYSTO (KITS) ×2 IMPLANT
LOOP CUT BIPOLAR 24F LRG (ELECTROSURGICAL) IMPLANT
MANIFOLD NEPTUNE II (INSTRUMENTS) IMPLANT
PACK CYSTO (CUSTOM PROCEDURE TRAY) ×2 IMPLANT
SYR TOOMEY IRRIG 70ML (MISCELLANEOUS)
SYRINGE TOOMEY IRRIG 70ML (MISCELLANEOUS) IMPLANT
TUBE CONNECTING 12X1/4 (SUCTIONS) IMPLANT
WATER STERILE IRR 500ML POUR (IV SOLUTION) ×1 IMPLANT

## 2020-03-29 NOTE — Anesthesia Preprocedure Evaluation (Addendum)
Anesthesia Evaluation  Patient identified by MRN, date of birth, ID band Patient awake    Reviewed: Allergy & Precautions, NPO status , Patient's Chart, lab work & pertinent test results  Airway Mallampati: I  TM Distance: >3 FB Neck ROM: Full    Dental  (+) Edentulous Upper, Edentulous Lower   Pulmonary sleep apnea , COPD, Current Smoker,     + decreased breath sounds      Cardiovascular hypertension, + CAD   Rhythm:Regular Rate:Normal     Neuro/Psych Seizures -,  PSYCHIATRIC DISORDERS Anxiety Depression Bipolar Disorder TIA Neuromuscular disease    GI/Hepatic Neg liver ROS, hiatal hernia, GERD  Medicated,  Endo/Other  diabetes, Type 2, Insulin Dependent, Oral Hypoglycemic AgentsHypothyroidism   Renal/GU negative Renal ROS     Musculoskeletal  (+) Arthritis , Rheumatoid disorders,    Abdominal Normal abdominal exam  (+)   Peds  Hematology   Anesthesia Other Findings - HLD  Reproductive/Obstetrics                            Anesthesia Physical Anesthesia Plan  ASA: III  Anesthesia Plan: General   Post-op Pain Management:    Induction: Intravenous  PONV Risk Score and Plan: 2 and Ondansetron, Dexamethasone and Midazolam  Airway Management Planned: LMA  Additional Equipment: None  Intra-op Plan:   Post-operative Plan: Extubation in OR  Informed Consent: I have reviewed the patients History and Physical, chart, labs and discussed the procedure including the risks, benefits and alternatives for the proposed anesthesia with the patient or authorized representative who has indicated his/her understanding and acceptance.       Plan Discussed with: CRNA  Anesthesia Plan Comments: (Echo (2020):  1. The left ventricle has normal systolic function, with an ejection  fraction of 55-60%. The cavity size was normal. There is mildly increased  left ventricular wall thickness. Left  ventricular diastolic parameters  were normal.  2. The right ventricle has normal systolic function. The cavity was  normal. There is no increase in right ventricular wall thickness.  3. Mild thickening of the mitral valve leaflet.  4. The aortic valve is tricuspid. Mild thickening of the aortic valve.  5. The aorta is normal in size and structure.  6. The interatrial septum was not well visualized. )       Anesthesia Quick Evaluation

## 2020-03-29 NOTE — H&P (Signed)
Office Visit Report     03/03/2020   --------------------------------------------------------------------------------   Vincent [name]  MRN: [id number]  DOB: October 04, 1965, 55 year old Male  SSN: [id number]   PRIMARY CARE:  Zigmund Gottron. Arelia Sneddon, MD  REFERRING:  Kathie Rhodes, MD  PROVIDER:  Rexene Alberts, M.D.  LOCATION:  [facility] [phone]     --------------------------------------------------------------------------------   CC/HPI: Vincent [name] is a 55 year old male who is seen in follow-up with bladder cancer.   He was diagnosed on 11/26/2015 with LG TA UCB. He has been surveilled with annual cystoscopy and is had no recurrence until 03/03/2020 with evidence of at least 3 separate low-grade appearing papillary lesions on the trigone and left posterior bladder wall. He denies gross hematuria. He denies pain. He denies unexpected weight loss.   He does complain of worsening lower urine tract symptoms. He takes tamsulosin 0.4 mg daily. He is potentially interested in surgical intervention for bladder outlet obstruction.   He also has a history of chronic bilateral testicular pain and underwent an elective right orchiectomy on 02/21/2016. He also underwent an elective left orchiectomy on 09/02/2018. There is no evidence of malignancy in either specimen.   Patient currently denies fever, chills, sweats, nausea, vomiting, abdominal or flank pain, gross hematuria or dysuria.     ALLERGIES: CeleBREX CAPS - Swelling Hydrocodone-Acetaminophen CAPS - Itching, Skin Rash Sulfa Drugs - Itching    MEDICATIONS: Tamsulosin Hcl 0.4 mg capsule 2 capsule PO Q PM  Tamsulosin Hcl 0.4 mg capsule 1 capsule PO Q PM  Abilify  Atorvastatin Calcium 10 mg tablet Oral  Basaglar Kwikpen U-100  Duragesic 100 mcg/hour patch, transdermal 72 hours Transdermal  Folic Acid 0.8 mg tablet Oral  Gabapentin 600 mg tablet Oral  Humalog Mix 75-25 Kwikpen 100 unit/ml (75-25) insulin pen Subcutaneous   Levothyroxine Sodium 25 MCG Oral Tablet Oral  Lyrica 100 mg capsule Oral  Metoclopramide HCl - 5 MG Oral Tablet Oral  Prenatal Vitamin For Men  Tamsulosin HCl - 0.4 MG Oral Capsule 0 Oral  Testosterone Cypionate 200 mg/ml vial 1 ml IM Q2WK  Trazodone Hcl     GU PSH: Bladder Instill AntiCA Agent - 2014 Cysto Bladder Ureth Biopsy - 2017 Cysto Fulgurate < 0.5 cm - 2017 Cystoscopy - 11/06/2018, 2020, 2019, 2019, 2019, 2018, 2018, 2017, 2017 Cystoscopy TURBT <2 cm - 2015 Cystoscopy TURBT 2-5 cm - 2014 N Block Inj Ilio-ing/hypogi - 2020, 2017 Penile Injection - 2018 Simple orchiectomy, Left - 2020, 2017       PSH Notes: Cystoscopy With Fulguration Small Lesion (5-75mm), Bladder Injection Of Cancer Treatment, Cystoscopy With Fulguration Medium Lesion (2-5cm), Cholecystectomy, Wrist Arthroscopy With Release Of Transverse Carpal Ligament, Rotator Cuff Repair, Elective Circumcision   NON-GU PSH: Carpal tunnel surgery - 2014 Cholecystectomy (open) - 2014     GU PMH: History of bladder cancer (Stable), He had no evidence of recurrent transitional cell carcinoma noted cystoscopically. He will return in 6 months for repeat surveillance. - 11/06/2018, (Stable), He had no evidence of recurrent transitional cell carcinoma the bladder noted cystoscopically today. He will continue surveillance cystoscopy with a repeat in 6 months., - 2020, - 2020 (Stable), He had no evidence of recurrent transitional cell carcinoma on cystoscopy today. It is now been 2 years and therefore he will return in 6 months for his next surveillance cystoscopy, - 2019 (Stable), He had no evidence of recurrent transitional cell carcinoma of the bladder today. He did have bacteriuria and is experiencing dysuria. I am  going to continue monitoring him with surveillance cystoscopy every 3 months., - 2019 (Stable), He had no evidence of recurrent transitional cell carcinoma of the bladder but even know he has had low-grade, superficial  lesions he has had a recurrent so I am going to continue to monitor him every 3 months with surveillance cystoscopy., - 2019 (Stable), He had no evidence of recurrent transitional cell carcinoma bladder noted cystoscopically today. I will have him return again in 3 months for repeat surveillance cystoscopy., - 2018 (Stable), He had no evidence of recurrent transitional cell carcinoma of the bladder noted cystoscopically today. I will continue monitoring him with surveillance cystoscopy every 3 months for now., - 2018 (Stable), He had no evidence of transitional cell carcinoma of the bladder today. His urine will be sent for cytology and I'll plan to see him back in 3 months for surveillance cystoscopy., - 2017 (Stable), He has a history of bladder cancer although he is not having any urinary symptoms., - 2017 (Stable), I have given him samples of Uribel to see if this seems to help any better and he is going to contact me in a couple of weeks if he is not noting improvement., - 2017 (Worsening), Left, There is an area just posterior to his left ureteral orifice that I had noted previously. It doesn't really appear that it has progressed but I think it may be just a little bit too large to try and fulguration in the office without causing the patient discomfort and therefore I recommended doing this as an outpatient under anesthesia. He is in agreement with that., - 2017, History of carcinoma of bladder, - 2017 Urinary Hesitancy, He has developed worsening obstructive symptoms consisting primarily of hesitancy and intermittency. I increased his tamsulosin 0.8 mg. This may be due to the testosterone replacement to some degree. - 11/06/2018, . He has developed some mild hesitancy but it is not significant enough that he would want to consider medication at this point., - 2019, He has reported having developed significant hesitancy. He does have some BPH noted cystoscopically. I'm going to try a course of tamsulosin., -  2018 Primary hypogonadism (Stable), He was administered his 1st testosterone injection today. I am going to have him come in for a mid cycle testosterone level in 3 weeks. - 09/16/2018, Hypogonadism, testicular, - 2017 Left testicular pain (Stable, Chronic), At this point what I have recommended is that he contact me because I am hopeful that he will achieve relief and then this will break the pain cycle and he will not need any further therapy but I told him to let me know if he gets no relief at all or if he only gets a short duration of relief with recurrence of the pain. - 2020, Left, He has developed significant left testicular pain to the point where he told me he would like to have his left testicle removed. I told him before we did anything like that my recommendation is to 1st undergo a cord block and we discussed the mechanism by which this worked and the fact that it often times can break the cycle of pain. If it is determined that the pain is coming from his testicle and he does not experience lasting relief then I briefly discussed skeletonization of the spermatic cord with removal of nerves., - 2020 Disorder of male genital organs, unspecified - 2020 BPH w/o LUTS (Stable), He does have some BPH noted cystoscopically but is currently not experiencing significant obstructive voiding  symptoms on tamsulosin now. - 2019 ED due to arterial insufficiency (Stable), He continues to have difficulty with erectile dysfunction and this has not responded to phosphodiesterase inhibitor therapy or intracavernosal injection therapy. We briefly discussed penile prosthesis implantation today and I have given him information from [place] to review. - 2019, (Stable), He had a moderate erection with 0.5 cc so I recommended increasing the dosage to 0.7 with a maximum of 1.0 cc but started off at the lower dose with sexual stimulation. I went over the need to vary the location and the potential risks and complications.,  - 2018 (Worsening), He has not responded to Viagra and inquired as to whether other options were available. We discussed Cialis and he would like to proceed with that. He, - 2017 Epididymitis (Stable), We did discuss surgical therapy if the cord block was ineffective. - 2017, (Worsening), Right, He continues to have fairly significant pain. There was no evidence of abscess or neoplasm in his epididymis. This is the area that is tender. I don't feel that this is due to any nerve impingement in his back since he is quite tender to palpation. Ice has made it worse so I recommended the use of a heating pad and I am going to put him on nonsteroidal anti-inflammatory medication. I do not feel further antibiotics are necessary. If he has not seen any improvement either beginning of next week I am going to have him return and perform a cord block. We did discuss today the last resort which would be surgical removal of his epididymis., - 2017 (Worsening, Acute), Right, He clearly has tenderness in the epididymis and we discussed the possible etiologies. I think infectious etiology is probably the most likely. I told him that his recent surgery would not have caused this and he did receive perioperative antibiotics for the procedure. I am going to place him on a different antibiotic with a different spectrum and also give him further pain medication. It is possible this may be a spermatocele which would be self-limited but are now I'm going to continue antibiotic therapy and pain medication with a scrotal ultrasound if she does not note improvement., - 2017, Right, - 2017 Right testicular pain (Stable), His right testicular pain has persisted and therefore he underwent a right cord block today. - 2017 Dysuria, We discussed cystoscopy as a way to evaluate this as he does not appear to have any infection but he wanted to hold off on that. I think that's reasonable as I suspect this will resolve over time. - 2017 BPH w/LUTS,  Benign prostatic hyperplasia with hesitancy - 2017 Other microscopic hematuria, Microscopic hematuria - 2016 Urinary Urgency, Urinary urgency - 2016 Encounter for Prostate Cancer screening, Prostate cancer screening - 2015 Other Disorder Prostate, Acquired asymmetry of prostate - 82 Male ED, unspecified, Erectile dysfunction - 2014      PMH Notes: History of phimosis: He developed phimosis secondary to diabetes in 10/03 and underwent a circumcision in 2/05.   Transitional cell carcinoma of the bladder: He was found to have 7-10 rbc's/hpf. He has a 60-pack-year smoking history. He was found to have a papillary tumor on the left posterior wall of his bladder and underwent TURBT with postoperative mitomycin-C on 08/09/12.  Pathology: Papillary transitional cell carcinoma (Ta,G1)  In 12/15 lesion was noted in the prostatic urethra at the bladder neck and was resected.  Pathology: Cystitis cystica and cystitis glandularis only with no evidence of cancer.  Recurrence 11/26/15: Bladder biopsy positive for  low-grade, superficial papillary TCCa from the left floor and posterior wall. (Ta,G1)    Prostatic asymmetry: He was found on DRE to have some right lobe enlargement relative to the left however there were no worrisome findings and his PSA in 4/14 was 0.47.   LUTS: He noted the acute onset of hesitancy and intermittency with some associated suprapubic tenderness and intermittent bilateral testicular pain without associated dysuria.   Primary hypogonadism: He was experiencing a decreased libido and diminished erectile quality. He was found to have a serum of 167. Further evaluation revealed a normal FSH, LH and prolactin indicating primary hypogonadism.  Previously tried: AndroGel 2 pumps & Testim  Current therapy: Testosterone injections 100 mg q.3 weeks   Right testicular pain: He developed unrelenting right testicular pain and eventually, after multiple forms of therapy were tried and were  unsuccessful, underwent a right scrotal orchiectomy on 02/21/16 with placement of a testicular prosthesis.  Pathology: No abnormality noted of the testicle.   Erectile dysfunction: He has tried Cialis, sildenafil and even push the dosage up to the maximum without significant effect on his erectile function.  Current treatment: Intracavernosal injections (40/30/1).     NON-GU PMH: Local infection of the skin and subcutaneous tissue, unspecified, He appears to have a superficial wound infection. There is no fluctuance so I am going to culture this and empirically begin Augmentin. He is going to clean the wound with half-strength hydrogen peroxide twice a day and apply Neosporin. - 09/16/2018 Infection following a procedure, initial encounter (Acute), Wound culture sent. - 2017 Other specified postprocedural states, S/P orchiectomy. Wound culture sent. Will have pt begin Doxycycline 100 mg 1 po BID X 10 days. May use heat TID/QID. Use good scrotal support. Contact oncall over holiday weekend if temp >100.5, worsening pain/swelling. - 2017 Anxiety, Anxiety (Symptom) - 2014 Gastric ulcer, unspecified as acute or chronic, without hemorrhage or perforation, Gastric Ulcer - 2014 Personal history of other diseases of the circulatory system, History of hypertension - 2014 Personal history of other diseases of the digestive system, History of esophageal reflux - 2014 Personal history of other endocrine, nutritional and metabolic disease, History of hypercholesterolemia - 2014, History of diabetes mellitus, - 2014, History of hypothyroidism, - 2014 Personal history of other mental and behavioral disorders, History of depression - 2014    FAMILY HISTORY: Acute Myocardial Infarction - Father Death In The Family Father - Runs In Family Diabetes - [place] _4__ Living Daughter - Runs In Family Family Health Status Of Mother - Alive 43 - Runs In Family nephrolithiasis -  Father renal failure - Father   SOCIAL HISTORY: Marital Status: Married Preferred Language: English; Race: White Current Smoking Status: Patient smokes. Smokes 2 packs per day.  Does not drink anymore.  Does not use drugs. Drinks 4+ caffeinated drinks per day.     Notes: Current every day smoker, Tobacco use, Alcohol Use, Caffeine Use, Marital History - Currently Married   REVIEW OF SYSTEMS:    GU Review Male:   Patient denies frequent urination, hard to postpone urination, burning/ pain with urination, get up at night to urinate, leakage of urine, stream starts and stops, trouble starting your stream, have to strain to urinate , erection problems, and penile pain.  Gastrointestinal (Upper):   Patient denies nausea, vomiting, and indigestion/ heartburn.  Gastrointestinal (Lower):   Patient denies diarrhea and constipation.  Constitutional:   Patient denies fever, night sweats, weight loss, and fatigue.  Skin:  Patient denies itching and skin rash/ lesion.  Eyes:   Patient denies blurred vision and double vision.  Ears/ Nose/ Throat:   Patient denies sore throat and sinus problems.  Hematologic/Lymphatic:   Patient denies swollen glands and easy bruising.  Cardiovascular:   Patient denies leg swelling and chest pains.  Respiratory:   Patient denies cough and shortness of breath.  Endocrine:   Patient denies excessive thirst.  Musculoskeletal:   Patient denies back pain and joint pain.  Neurological:   Patient denies headaches and dizziness.  Psychologic:   Patient denies depression and anxiety.   VITAL SIGNS:      03/03/2020 11:59 AM  Weight 213 lb / 96.62 kg  Height 72 in / 182.88 cm  BP 122/71 mmHg  Pulse 89 /min  Temperature 97.1 F / 36.1 C  BMI 28.9 kg/m   MULTI-SYSTEM PHYSICAL EXAMINATION:    Constitutional: Well-nourished. No physical deformities. Normally developed. Good grooming.  Respiratory: No labored breathing, no use of accessory muscles.   Cardiovascular: Normal  temperature, normal extremity pulses, no swelling, no varicosities.  Gastrointestinal: No mass, no tenderness, no rigidity, non obese abdomen.     Complexity of Data:  Source Of History:  Patient, Medical Record Summary  Lab Test Review:   PSA  Records Review:   AUA Symptom Score, IIEF Score  Urine Test Review:   Urinalysis   09/03/17 07/19/17 12/05/13 08/02/13 07/09/12  PSA  Total PSA 1.36 ng/mL 4.95 ng/mL 0.83  0.53  0.47   Free PSA  0.66 ng/mL     % Free PSA  13 % PSA       10/17/18 09/02/18 05/22/18 12/05/13 08/02/13 02/04/13 01/17/13  Hormones  Testosterone, Total 388.4 ng/dL 215 pg/dL 294.0 ng/dL 219  212  232  167     PROCEDURES:          Urinalysis w/Scope Dipstick Dipstick Cont'd Micro  Color: Yellow Bilirubin: Neg mg/dL WBC/hpf: 0 - 5/hpf  Appearance: Clear Ketones: Neg mg/dL RBC/hpf: 0 - 2/hpf  Specific Gravity: 1.025 Blood: Neg ery/uL Bacteria: Few (10-25/hpf)  pH: 6.5 Protein: 1+ mg/dL Cystals: NS (Not Seen)  Glucose: Trace mg/dL Urobilinogen: 1.0 mg/dL Casts: Hyaline    Nitrites: Neg Trichomonas: Not Present    Leukocyte Esterase: Neg leu/uL Mucous: Present      Epithelial Cells: 0 - 5/hpf      Yeast: NS (Not Seen)      Sperm: Not Present    ASSESSMENT:      ICD-10 Details  1 GU:   BPH w/LUTS - N40.1   2   Bladder tumor/neoplasm - D41.4   3   Urinary Frequency - R35.0   4   Weak Urinary Stream - R39.12    PLAN:           Document Letter(s):  Created for Patient: Clinical Summary         Notes:   #1. Low risk nonmuscle invasive bladder cancer:  -Dx 11/26/2015 with LG Ta UCB  -Cystoscopy 03/03/2020 with 3 papillary lesions on the trigone and posterior left aspect of the bladder. Will arrange for TURBT with gemcitabine.   2. BPH with LUTS: Advise increasing tamsulosin at 0.8 mg dose daily. We did briefly discuss ureteral intervention. We will consider this after resecting his bladder lesion.   3. Hypogonadism: History of chronic bilateral testicular  pain and s/p right orchiectomy on 02/21/2016 and a left orchiectomy on 09/02/2018. He was previously on TRT. Advised testosterone placement therapy. He  elects to hold off at this time.   CC: Vincent Gower, MD     Signed by Rexene Alberts, M.D. on 03/03/20 at 12:24 PM (EST)  Urology Preoperative H&P   Chief Complaint: bladder cancer  History of Present Illness: Vincent [name] is a 55 y.o. male with 3 papillary bladder lesions here for TURBT.    Past Medical History:  Diagnosis Date  . Anxiety   . Benign essential tremor   . Bipolar 2 disorder ([place])    followed by [facility]--- dr s. Adele Schilder  . Bladder cancer (HCC)    recurrent  . CAD (coronary artery disease)    cardiac cath 2003  and 2011 both showed normal coronary arteries w/ preserved lvf;  Non obstructive on CTA Oct 2019.   Marland Kitchen Chronic pain syndrome    back---- [facility] [place]  . Cold extremities    BLE  . COPD (chronic obstructive pulmonary disease) ([place])   . DDD (degenerative disc disease), lumbar   . Diabetic peripheral neuropathy ([place])   . Gastroparesis    followed by dr Henrene Pastor  . GERD (gastroesophageal reflux disease)   . Hiatal hernia   . History of bladder cancer urologist-  previously dr Consuella Lose;  now dr Macen Joslin   papillay TCC (Ta G1)  s/p TURBT and chemo instillation 2014  . History of chest pain 12/2017   heart cath normal  . History of encephalopathy 05/27/2015   admission w/ acute encephalopathy thought to be secondary to pain meds and COPD  . History of gastric ulcer   . History of Helicobacter pylori infection   . History of kidney stones   . History of TIA (transient ischemic attack) 2008  and 10-19-2018    no residual's  . History of traumatic head injury 2010   w/ LOC  per pt needed stitches, hit in head with a mower blade  . Hyperlipidemia   . Hypertension   . Hypogonadism male    s/p  bilateral orchiectomy  . Hypothyroidism   . Insomnia   . Mild obstructive sleep apnea    study in  epic 12-04-2016, no cpap  . PTSD (post-traumatic stress disorder)    chronic  . RA (rheumatoid arthritis) ([place])    followed by [facility]  . Seizures, transient [facility]) neurologist-  dr Krista Blue--  differential dx complex partial seizure .vs.  mood disorder .vs.  pseudoseizure--  negative EEG's   confusion episodes and staring spells since 11/ 2015   (03-26-2020 per pt wife last seizure 10 /2021)  . Transient confusion NEUOROLOGIST-  DR YAN   Episodes since 11/ 2015--  neurologist dx  differential complex partial seizure  .vs. mood disorder . vs. pseudoseizure  . Type 2 diabetes mellitus treated with insulin [facility])    endocrinologist--- dr Loanne Drilling---  (03-26-2020 pt does not check blood sugar at home)    Past Surgical History:  Procedure Laterality Date  . CARDIAC CATHETERIZATION  12-27-2001  DR Einar Gip  &  05-26-2009  DR Irish Lack   RESULTS FOR BOTH ARE NORMAL CORONARIES AND PERSERVED LVF/ EF 60%  . CARPAL TUNNEL RELEASE Bilateral right 09-16-2003;  left ?  . CARPAL TUNNEL RELEASE Left 02/25/2015   Procedure: LEFT CARPAL TUNNEL RELEASE;  Surgeon: Leanora Cover, MD;  Location: [place];  Service: Orthopedics;  Laterality: Left;  . CYSTOSCOPY N/A 10/10/2012   Procedure: CYSTOSCOPY CLOT EVACUATION FULGERATION OF BLEEDERS ;  Surgeon: Claybon Jabs, MD;  Location: [facility];  Service: Urology;  Laterality: N/A;  . CYSTOSCOPY WITH BIOPSY N/A 11/26/2015   Procedure: CYSTOSCOPY WITH BIOPSY AND FULGURATION;  Surgeon: Kathie Rhodes, MD;  Location: [place];  Service: Urology;  Laterality: N/A;  . ESOPHAGOGASTRODUODENOSCOPY  2014  . LAPAROSCOPIC CHOLECYSTECTOMY  11-17-2010  . ORCHIECTOMY Right 02/21/2016   Procedure: SCROTAL ORCHIECTOMY with TESTICULAR PROSTHESIS IMPLANT;  Surgeon: Kathie Rhodes, MD;  Location: [facility];  Service: Urology;  Laterality: Right;  . ORCHIECTOMY Left 09/02/2018   Procedure: ORCHIECTOMY;  Surgeon:  Kathie Rhodes, MD;  Location[facility];  Service: Urology;  Laterality: Left;  . ROTATOR CUFF REPAIR Right 12/2004  . TRANSURETHRAL RESECTION OF BLADDER TUMOR N/A 08/09/2012   Procedure: TRANSURETHRAL RESECTION OF BLADDER TUMOR (TURBT) WITH GYRUS WITH MITOMYCIN C;  Surgeon: Claybon Jabs, MD;  Location[facility];  Service: Urology;  Laterality: N/A;  . TRANSURETHRAL RESECTION OF BLADDER TUMOR WITH GYRUS (TURBT-GYRUS) N/A 02/27/2014   Procedure: TRANSURETHRAL RESECTION OF BLADDER TUMOR WITH GYRUS (TURBT-GYRUS);  Surgeon: Claybon Jabs, MD;  Location: [facility];  Service: Urology;  Laterality: N/A;    Allergies:  Allergies  Allergen Reactions  . Celebrex [Celecoxib] Anaphylaxis and Rash  . Hydrocodone Rash and Other (See Comments)    "blisters developed on arms"  . Sulfa Antibiotics Rash    Family History  Problem Relation Age of Onset  . Diabetes Mother   . Diabetes Father   . Hypertension Father   . Heart attack Father 53       died age 15  . Alcohol abuse Father   . Colon cancer Neg Hx   . Esophageal cancer Neg Hx   . Stomach cancer Neg Hx   . Rectal cancer Neg Hx     Social History:  reports that he has been smoking cigarettes. He has a 76.00 pack-year smoking history. He has never used smokeless tobacco. He reports that he does not drink alcohol and does not use drugs.  ROS: A complete review of systems was performed.  All systems are negative except for pertinent findings as noted.  Physical Exam:  Vital signs in last 24 hours: Temp:  [96.9 F (36.1 C)] 96.9 F (36.1 C) (01/17 1106) Pulse Rate:  [82] 82 (01/17 1106) Resp:  [16] 16 (01/17 1106) BP: (157)/(75) 157/75 (01/17 1106) SpO2:  [98 %] 98 % (01/17 1106) Weight:  [98.7 kg] 98.7 kg (01/17 1106) Constitutional:  Alert and oriented, No acute distress Cardiovascular: Regular rate and rhythm Respiratory: Normal respiratory effort, Lungs clear bilaterally GI:  Abdomen is soft, nontender, nondistended, no abdominal masses GU: No CVA tenderness Lymphatic: No lymphadenopathy Neurologic: Grossly intact, no focal deficits Psychiatric: Normal mood and affect  Laboratory Data:  Recent Labs    03/29/20 1126 03/29/20 1139  HGB 5.4* 11.9*  HCT 16.0* 35.0*    Recent Labs    03/29/20 1126 03/29/20 1139  NA 142 142  K 4.8 4.2  CL 107 107  GLUCOSE 232* 216*  BUN 22* 18  CREATININE 1.00 0.90     Results for orders placed or performed during the hospital encounter of 03/29/20 (from the past 24 hour(s))  I-STAT, chem 8     Status: Abnormal   Collection Time: 03/29/20 11:26 AM  Result Value Ref Range   Sodium 142 135 - 145 mmol/L   Potassium 4.8 3.5 - 5.1 mmol/L   Chloride 107 98 - 111 mmol/L   BUN 22 (  H) 6 - 20 mg/dL   Creatinine, Ser 1.00 0.61 - 1.24 mg/dL   Glucose, Bld 232 (H) 70 - 99 mg/dL   Calcium, Ion 1.32 1.15 - 1.40 mmol/L   TCO2 27 22 - 32 mmol/L   Hemoglobin 5.4 (LL) 13.0 - 17.0 g/dL   HCT 16.0 (L) 39.0 - 52.0 %   Comment MD NOTIFIED, REPEAT TEST   I-STAT, chem 8     Status: Abnormal   Collection Time: 03/29/20 11:39 AM  Result Value Ref Range   Sodium 142 135 - 145 mmol/L   Potassium 4.2 3.5 - 5.1 mmol/L   Chloride 107 98 - 111 mmol/L   BUN 18 6 - 20 mg/dL   Creatinine, Ser 0.90 0.61 - 1.24 mg/dL   Glucose, Bld 216 (H) 70 - 99 mg/dL   Calcium, Ion 1.27 1.15 - 1.40 mmol/L   TCO2 24 22 - 32 mmol/L   Hemoglobin 11.9 (L) 13.0 - 17.0 g/dL   HCT 35.0 (L) 39.0 - 52.0 %   Recent Results (from the past 240 hour(s))  SARS CORONAVIRUS 2 (TAT 6-24 HRS) Nasopharyngeal Nasopharyngeal Swab     Status: None   Collection Time: 03/25/20  8:27 AM   Specimen: Nasopharyngeal Swab  Result Value Ref Range Status   SARS Coronavirus 2 NEGATIVE NEGATIVE Final    Comment: (NOTE) SARS-CoV-2 target nucleic acids are NOT DETECTED.  The SARS-CoV-2 RNA is generally detectable in upper and lower respiratory specimens during the acute phase of  infection. Negative results do not preclude SARS-CoV-2 infection, do not rule out co-infections with other pathogens, and should not be used as the sole basis for treatment or other patient management decisions. Negative results must be combined with clinical observations, patient history, and epidemiological information. The expected result is Negative.  Fact Sheet for Patients: SugarRoll.be  Fact Sheet for Healthcare Providers: https://www.woods-mathews.com/  This test is not yet approved or cleared by the [place] FDA and  has been authorized for detection and/or diagnosis of SARS-CoV-2 by FDA under an Emergency Use Authorization (EUA). This EUA will remain  in effect (meaning this test can be used) for the duration of the COVID-19 declaration under Se ction 564(b)(1) of the Act, 21 U.S.C. section 360bbb-3(b)(1), unless the authorization is terminated or revoked sooner.  Performed at [facility], [place] [address], [place], [place] [address]     Renal Function: Recent Labs    03/29/20 1126 03/29/20 1139  CREATININE 1.00 0.90   Estimated Creatinine Clearance: 116.8 mL/min (by C-G formula based on SCr of 0.9 mg/dL).  Radiologic Imaging: No results found.  I independently reviewed the above imaging studies.  Assessment and Plan KASAUN BARBOSA is a 55 y.o. male with 3 papillary bladder lesions here for TURBT.   Matt R. Kristjan Derner MD 03/29/2020, 12:47 PM  [facility] Pager: [phone]): [phone]

## 2020-03-29 NOTE — Op Note (Signed)
Operative Note  Preoperative diagnosis:  1.  Bladder cancer 2. BPH with LUTS  Postoperative diagnosis: 1.  Bladder cancer 2. BPH with LUTS  Procedure(s): 1.  TURBT medium 2. Instillation of gemcitabine  Surgeon: Rexene Alberts, MD  Assistants:  None  Anesthesia:  General  Complications:  None  EBL:  Minimal  Specimens: 1.  ID Type Source Tests Collected by Time Destination  1 : bladder trigone bladder tumor Tissue PATH GU resection / TURBT / partial nephrectomy SURGICAL PATHOLOGY Janith Lima, MD 03/29/2020 1330   2 : left lateral wall bladder tumor Tissue PATH GU resection / TURBT / partial nephrectomy SURGICAL PATHOLOGY Janith Lima, MD 03/29/2020 1332   3 : posterior wall bladder tumor Tissue PATH GU resection / TURBT / partial nephrectomy SURGICAL PATHOLOGY Janith Lima, MD 03/29/2020 1335     Drains/Catheters: 1.  16 Fr foley  Intraoperative findings:   1.  Cystoscopy demonstrated wide caliber bulbar urethral stenosis approximately 21 [place], easily passable with scope and dilated to 73 [place]. 2.  Ureteroscopy demonstrated bilobar obstruction with moderate bilobar hypertrophy. 3.  Cystoscopy demonstrated 3 separate papillary appearing lesions with total volume approximately 2.5 cm.  With some carpeting on the trigone, left lateral wall and posterior wall.  These were resected down to the level of the muscle and all submitted in a separate specimen.   Number of tumors:       3 Size of largest tumor:        2 cm    Characteristics of tumors:     Papillary Yes    Flat  yes  Recurrent Yes        Suspicious for Carcinoma in situ: Yes  Clinical tumor stage:      cTa      Bimanual exam under anesthesia:       Yes  Visually complete resection:               Yes  Visualization of detrusor muscle in resection base:       Yes  Visual evaluation for perforation:          Yes (none)   Indication:  Vincent [name] is a 55 y.o. male with history of low risk  nonmuscle invasive bladder cancer diagnosed 11/26/2015 with low-grade TA urothelial carcinoma the bladder.  Cystoscopy 03/03/2020 identified 3 separate papillary lesions on the trigone and posterior left aspect of the bladder.  All the risks, benefits were discussed with the patient to include but not limited to infection, pain, bleeding, damage to adjacent structures, need for further operations, adverse reaction to anesthesia and death.  Patient understands these risks and agrees to proceed with the operation as planned.    Description of procedure: After informed consent was obtained from the patient, the patient was taken to the operating room. General anesthesia was administered. The patient was placed in dorsal lithotomy position and prepped and draped in usual sterile fashion. Sequential compression devices were applied to lower extremities at the beginning of the case for DVT prophylaxis. Antibiotics were infused prior to surgery start time. A surgical time-out was performed to properly identify the patient, the surgery to be performed, and the surgical site.     We then passed the 21-French rigid cystoscope down the urethra and into the bladder under direct vision without any difficulty. The anterior urethral was normal.  There was a wide caliber bulbar urethral stenosis approximately 41 French is actually passed with scope and dilated  to 15 [place]. The prostate was moderately obstructing. The bladder was inspected with 30 and 70 degree lenses. Once in the bladder, systematic evaluation of bladder revealed 3 separate papillary lesions on the trigone, left lateral wall and posterior wall.. The ureteral orfices were in orthotopic position and not involved.   We then removed the cystoscope and then passed down the 26 French resectoscope sheath down the urethra into the bladder under direct vision with the visual obturator. The tumor was resected down to muscle. The TUR bladder tumor chips were retrieved  from the bladder and each region of resection was passed off the field as a separate specimen.  Hemostasis was achieved using electrocautery. We then proceeded with removing the resectoscope and then placed in a 18 French Foley catheter. The patient tolerated the procedure well with no complication and was awoken from anesthesia and taken to recovery in stable condition.     While in the post-operative care unit, as a separate procedure, 2000 mg of gemcitabine in 50 mL of water was instilled in the bladder through the catheter and the catheter was plugged. This will remain indwelling for approximately one hour. It will then be drained from the bladder and the catheter will be removed and the patient discharged home.  Plan: Remove Foley catheter after tolerating gemcitabine instillation.  Follow-up in clinic to review pathology.  He also like to discuss his BPH with LUTS.  Matt R. [facility]  Pager: [phone]

## 2020-03-29 NOTE — Transfer of Care (Signed)
Immediate Anesthesia Transfer of Care Note  Patient: Vincent [name]  Procedure(s) Performed: TRANSURETHRAL RESECTION OF BLADDER TUMOR (TURBT) and post-op instillation of gemcitabine (N/A Bladder)  Patient Location: PACU  Anesthesia Type:General  Level of Consciousness: awake, alert , oriented and patient cooperative  Airway & Oxygen Therapy: Patient Spontanous Breathing  Post-op Assessment: Report given to RN and Post -op Vital signs reviewed and stable  Post vital signs: Reviewed and stable  Last Vitals:  Vitals Value Taken Time  BP 143/84 03/29/20 1356  Temp    Pulse 84 03/29/20 1358  Resp 14 03/29/20 1358  SpO2 94 % 03/29/20 1358  Vitals shown include unvalidated device data.  Last Pain:  Vitals:   03/29/20 1106  TempSrc: Oral  PainSc: 4       Patients Stated Pain Goal: 5 (02/54/27 0623)  Complications: No complications documented.

## 2020-03-29 NOTE — Discharge Instructions (Signed)
°  Post Anesthesia Home Care Instructions  Activity: Get plenty of rest for the remainder of the day. A responsible adult should stay with you for 24 hours following the procedure.  For the next 24 hours, DO NOT: -Drive a car -Paediatric nurse -Drink alcoholic beverages -Take any medication unless instructed by your physician -Make any legal decisions or sign important papers.  Meals: Start with liquid foods such as gelatin or soup. Progress to regular foods as tolerated. Avoid greasy, spicy, heavy foods. If nausea and/or vomiting occur, drink only clear liquids until the nausea and/or vomiting subsides. Call your physician if vomiting continues.  Special Instructions/Symptoms: Your throat may feel dry or sore from the anesthesia or the breathing tube placed in your throat during surgery. If this causes discomfort, gargle with warm salt water. The discomfort should disappear within 24 hours.  If you had a scopolamine patch placed behind your ear for the management of post- operative nausea and/or vomiting:  1. The medication in the patch is effective for 72 hours, after which it should be removed.  Wrap patch in a tissue and discard in the trash. Wash hands thoroughly with soap and water. 2. You may remove the patch earlier than 72 hours if you experience unpleasant side effects which may include dry mouth, dizziness or visual disturbances. 3. Avoid touching the patch. Wash your hands with soap and water after contact with the patch.    Activity:  You are encouraged to ambulate frequently (about every hour during waking hours) to help prevent blood clots from forming in your legs or lungs.     Diet: You should advance your diet as instructed by your physician.  It will be normal to have some bloating, nausea, and abdominal discomfort intermittently.   Prescriptions:  You will be provided a prescription for pain medication to take as needed.  If your pain is not severe enough to require  the prescription pain medication, you may take extra strength Tylenol instead which will have less side effects.  You should also take a prescribed stool softener to avoid straining with bowel movements as the prescription pain medication may constipate you.   What to call us about: You should call the office [phone]) if you develop fever > 101 or develop persistent vomiting. Activity:  You are encouraged to ambulate frequently (about every hour during waking hours) to help prevent blood clots from forming in your legs or lungs.

## 2020-03-29 NOTE — Anesthesia Procedure Notes (Signed)
Procedure Name: LMA Insertion Date/Time: 03/29/2020 1:16 PM Performed by: Rogers Blocker, CRNA Pre-anesthesia Checklist: Patient identified, Emergency Drugs available, Suction available and Patient being monitored Patient Re-evaluated:Patient Re-evaluated prior to induction Oxygen Delivery Method: Circle System Utilized Preoxygenation: Pre-oxygenation with 100% oxygen Induction Type: IV induction Ventilation: Mask ventilation without difficulty LMA: LMA inserted LMA Size: 5.0 Number of attempts: 1 Placement Confirmation: positive ETCO2 Tube secured with: Tape Dental Injury: Teeth and Oropharynx as per pre-operative assessment

## 2020-03-30 ENCOUNTER — Encounter (HOSPITAL_BASED_OUTPATIENT_CLINIC_OR_DEPARTMENT_OTHER): Payer: Self-pay | Admitting: Urology

## 2020-03-30 LAB — POCT I-STAT, CHEM 8
BUN: 22 mg/dL — ABNORMAL HIGH (ref 6–20)
Calcium, Ion: 1.32 mmol/L (ref 1.15–1.40)
Chloride: 107 mmol/L (ref 98–111)
Creatinine, Ser: 1 mg/dL (ref 0.61–1.24)
Glucose, Bld: 232 mg/dL — ABNORMAL HIGH (ref 70–99)
HCT: 16 % — ABNORMAL LOW (ref 39.0–52.0)
Hemoglobin: 5.4 g/dL — CL (ref 13.0–17.0)
Potassium: 4.8 mmol/L (ref 3.5–5.1)
Sodium: 142 mmol/L (ref 135–145)
TCO2: 27 mmol/L (ref 22–32)

## 2020-03-30 LAB — SURGICAL PATHOLOGY

## 2020-03-30 NOTE — Anesthesia Postprocedure Evaluation (Signed)
Anesthesia Post Note  Patient: Remy Voiles Krawiec  Procedure(s) Performed: TRANSURETHRAL RESECTION OF BLADDER TUMOR (TURBT) and post-op instillation of gemcitabine (N/A Bladder)     Patient location during evaluation: PACU Anesthesia Type: General Level of consciousness: awake and alert Pain management: pain level controlled Vital Signs Assessment: post-procedure vital signs reviewed and stable Respiratory status: spontaneous breathing, nonlabored ventilation, respiratory function stable and patient connected to nasal cannula oxygen Cardiovascular status: blood pressure returned to baseline and stable Postop Assessment: no apparent nausea or vomiting Anesthetic complications: no   No complications documented.  Last Vitals:  Vitals:   03/29/20 1530 03/29/20 1607  BP: (!) 164/88 (!) 153/87  Pulse: 77 79  Resp: 15 18  Temp:    SpO2: 97% 97%    Last Pain:  Vitals:   03/29/20 1607  TempSrc:   PainSc: 0-No pain                 Effie Berkshire

## 2020-04-05 DIAGNOSIS — E291 Testicular hypofunction: Secondary | ICD-10-CM | POA: Diagnosis not present

## 2020-04-05 DIAGNOSIS — E0849 Diabetes mellitus due to underlying condition with other diabetic neurological complication: Secondary | ICD-10-CM | POA: Diagnosis not present

## 2020-04-05 DIAGNOSIS — N401 Enlarged prostate with lower urinary tract symptoms: Secondary | ICD-10-CM | POA: Diagnosis not present

## 2020-04-05 DIAGNOSIS — D414 Neoplasm of uncertain behavior of bladder: Secondary | ICD-10-CM | POA: Diagnosis not present

## 2020-04-05 DIAGNOSIS — F4310 Post-traumatic stress disorder, unspecified: Secondary | ICD-10-CM | POA: Diagnosis not present

## 2020-04-05 DIAGNOSIS — G8929 Other chronic pain: Secondary | ICD-10-CM | POA: Diagnosis not present

## 2020-04-05 DIAGNOSIS — M5136 Other intervertebral disc degeneration, lumbar region: Secondary | ICD-10-CM | POA: Diagnosis not present

## 2020-04-05 DIAGNOSIS — M47816 Spondylosis without myelopathy or radiculopathy, lumbar region: Secondary | ICD-10-CM | POA: Diagnosis not present

## 2020-04-05 DIAGNOSIS — M5416 Radiculopathy, lumbar region: Secondary | ICD-10-CM | POA: Diagnosis not present

## 2020-04-05 DIAGNOSIS — G894 Chronic pain syndrome: Secondary | ICD-10-CM | POA: Diagnosis not present

## 2020-04-05 DIAGNOSIS — R350 Frequency of micturition: Secondary | ICD-10-CM | POA: Diagnosis not present

## 2020-04-19 ENCOUNTER — Other Ambulatory Visit: Payer: Self-pay

## 2020-04-19 ENCOUNTER — Ambulatory Visit (INDEPENDENT_AMBULATORY_CARE_PROVIDER_SITE_OTHER): Payer: No Typology Code available for payment source

## 2020-04-19 ENCOUNTER — Encounter: Payer: Self-pay | Admitting: Emergency Medicine

## 2020-04-19 ENCOUNTER — Ambulatory Visit
Admission: EM | Admit: 2020-04-19 | Discharge: 2020-04-19 | Disposition: A | Discharge status: Home / Self Care | Payer: No Typology Code available for payment source | Attending: Family Medicine | Admitting: Family Medicine

## 2020-04-19 DIAGNOSIS — S61217A Laceration without foreign body of left little finger without damage to nail, initial encounter: Secondary | ICD-10-CM | POA: Diagnosis not present

## 2020-04-19 DIAGNOSIS — L089 Local infection of the skin and subcutaneous tissue, unspecified: Secondary | ICD-10-CM

## 2020-04-19 DIAGNOSIS — Z794 Long term (current) use of insulin: Secondary | ICD-10-CM

## 2020-04-19 DIAGNOSIS — S61317A Laceration without foreign body of left little finger with damage to nail, initial encounter: Secondary | ICD-10-CM

## 2020-04-19 DIAGNOSIS — E1140 Type 2 diabetes mellitus with diabetic neuropathy, unspecified: Secondary | ICD-10-CM

## 2020-04-19 MED ORDER — DOXYCYCLINE HYCLATE 100 MG PO CAPS: 100.0000 mg | ORAL_CAPSULE | Freq: Two times a day (BID) | ORAL | 0 refills | Status: DC

## 2020-04-19 MED ORDER — CEFTRIAXONE SODIUM 1 G IJ SOLR
1.0000 g | Freq: Once | INTRAMUSCULAR | Status: AC
  Administered 2020-04-19: 1 g via INTRAMUSCULAR

## 2020-04-19 NOTE — ED Triage Notes (Signed)
Pt here for laceration to left pinky 4 days ago now infected

## 2020-04-19 NOTE — Discharge Instructions (Addendum)
Start Doxycyline twice daily over the course of the next 14 days.  Do not discontinue taking antibiotics unless otherwise instructed by Dr. Sharol Given. Imaging is negative for any deep bone infection however given your lack of sensation and history of diabetes I would like for Dr. Sharol Given to follow you closely until this infection resolves.

## 2020-04-19 NOTE — ED Provider Notes (Signed)
[facility]    CSN: [id number] Arrival date & time: 04/19/20  1633      History   Chief Complaint Chief Complaint  Patient presents with  . Hand Pain    HPI Vincent [name] is a 55 y.o. male.   HPI  Patient presents today for evaluation of a accidental laceration sustained to his left fifth digit 6 days ago.  Patient was cutting up vegetables in his kitchen when injury occurred.  Patient grew concerned 3 days ago as he developed purulent drainage, swelling with some loss of sensation in the proximal region of the fifth digit.  Patient is a type II diabetic and has some underlying cardiovascular disease.  He endorses pain that has been gradually worsening since injury occurred.  He also has some history of diabetic peripheral neuropathy.  Patient reports last tetanus vaccine 1 year ago.  Past Medical History:  Diagnosis Date  . Anxiety   . Benign essential tremor   . Bipolar 2 disorder ([place])    followed by [facility]--- dr s. Adele Schilder  . Bladder cancer (HCC)    recurrent  . CAD (coronary artery disease)    cardiac cath 2003  and 2011 both showed normal coronary arteries w/ preserved lvf;  Non obstructive on CTA Oct 2019.   Marland Kitchen Chronic pain syndrome    back---- [facility] [place]  . Cold extremities    BLE  . COPD (chronic obstructive pulmonary disease) ([place])   . DDD (degenerative disc disease), lumbar   . Diabetic peripheral neuropathy ([place])   . Gastroparesis    followed by dr Henrene Pastor  . GERD (gastroesophageal reflux disease)   . Hiatal hernia   . History of bladder cancer urologist-  previously dr Consuella Lose;  now dr gay   papillay TCC (Ta G1)  s/p TURBT and chemo instillation 2014  . History of chest pain 12/2017   heart cath normal  . History of encephalopathy 05/27/2015   admission w/ acute encephalopathy thought to be secondary to pain meds and COPD  . History of gastric ulcer   . History of Helicobacter pylori infection   . History of kidney  stones   . History of TIA (transient ischemic attack) 2008  and 10-19-2018    no residual's  . History of traumatic head injury 2010   w/ LOC  per pt needed stitches, hit in head with a mower blade  . Hyperlipidemia   . Hypertension   . Hypogonadism male    s/p  bilateral orchiectomy  . Hypothyroidism   . Insomnia   . Mild obstructive sleep apnea    study in epic 12-04-2016, no cpap  . PTSD (post-traumatic stress disorder)    chronic  . RA (rheumatoid arthritis) ([place])    followed by [facility]  . Seizures, transient [facility]) neurologist-  dr Krista Blue--  differential dx complex partial seizure .vs.  mood disorder .vs.  pseudoseizure--  negative EEG's   confusion episodes and staring spells since 11/ 2015   (03-26-2020 per pt wife last seizure 10 /2021)  . Transient confusion NEUOROLOGIST-  DR YAN   Episodes since 11/ 2015--  neurologist dx  differential complex partial seizure  .vs. mood disorder . vs. pseudoseizure  . Type 2 diabetes mellitus treated with insulin [facility])    endocrinologist--- dr Loanne Drilling---  (03-26-2020 pt does not check blood sugar at home)    Patient Active Problem List   Diagnosis Date Noted  . Pain due  to onychomycosis of toenails of both feet 01/28/2020  . Hyperglycemia due to type 2 diabetes mellitus ([place]) 01/07/2020  . Resistance to insulin 01/07/2020  . Right leg weakness 04/24/2019  . Right leg pain 04/24/2019  . Gait abnormality 04/24/2019  . TIA (transient ischemic attack) 10/20/2018  . Uncontrolled type 2 DM with hyperosmolar nonketotic hyperglycemia ([place]) 10/19/2018  . Left sided numbness 10/19/2018  . Diabetic autonomic neuropathy associated with type 2 diabetes mellitus ([place]) 02/01/2018  . DM type 2 with diabetic peripheral neuropathy ([place]) 02/01/2018  . Dyslipidemia 01/31/2018  . Coronary artery disease involving native coronary artery of native heart without angina pectoris 01/31/2018  . Chest pain 01/01/2018  . Seizure disorder ([place])  12/19/2016  . Essential tremor 12/19/2016  . Polypharmacy 12/19/2016  . Chronic post-traumatic stress disorder (PTSD) 06/16/2016  . Tremor 06/16/2016  . Tobacco use disorder 05/05/2016  . Psychophysiological insomnia 06/02/2015  . Altered mental status 05/28/2015  . Acute bronchitis 05/28/2015  . Type 2 diabetes mellitus with hyperglycemia ([place]) 05/28/2015  . Hypothyroidism 05/28/2015  . Bipolar 2 disorder ([place]) 05/28/2015  . History of rheumatoid arthritis 05/28/2015  . Chronic pain 05/28/2015  . Depression 03/18/2015  . Carpal tunnel syndrome on left 02/10/2015  . Carpal tunnel syndrome on right 02/10/2015  . Diabetes ([place]) 01/23/2015  . Obesity (BMI 30-39.9) 03/06/2014  . Acute encephalopathy 03/05/2014  . HLD (hyperlipidemia) 03/05/2014  . Anemia, normocytic normochromic 03/05/2014  . Altered mental state   . Helicobacter pylori (H. pylori) infection 11/20/2012  . Protein-calorie malnutrition, severe ([place]) 10/09/2012  . Dysphagia 08/19/2012  . Gastroparesis 08/19/2012  . Bladder tumor 08/08/2012  . Biliary dyskinesia 11/02/2010    Past Surgical History:  Procedure Laterality Date  . CARDIAC CATHETERIZATION  12-27-2001  DR Einar Gip  &  05-26-2009  DR Irish Lack   RESULTS FOR BOTH ARE NORMAL CORONARIES AND PERSERVED LVF/ EF 60%  . CARPAL TUNNEL RELEASE Bilateral right 09-16-2003;  left ?  . CARPAL TUNNEL RELEASE Left 02/25/2015   Procedure: LEFT CARPAL TUNNEL RELEASE;  Surgeon: Leanora Cover, MD;  Location: [facility];  Service: Orthopedics;  Laterality: Left;  . CYSTOSCOPY N/A 10/10/2012   Procedure: CYSTOSCOPY CLOT EVACUATION FULGERATION OF BLEEDERS ;  Surgeon: Claybon Jabs, MD;  Location[facility];  Service: Urology;  Laterality: N/A;  . CYSTOSCOPY WITH BIOPSY N/A 11/26/2015   Procedure: CYSTOSCOPY WITH BIOPSY AND FULGURATION;  Surgeon: Kathie Rhodes, MD;  Location: [facility];  Service: Urology;  Laterality: N/A;  .  ESOPHAGOGASTRODUODENOSCOPY  2014  . LAPAROSCOPIC CHOLECYSTECTOMY  11-17-2010  . ORCHIECTOMY Right 02/21/2016   Procedure: SCROTAL ORCHIECTOMY with TESTICULAR PROSTHESIS IMPLANT;  Surgeon: Kathie Rhodes, MD;  Location[facility];  Service: Urology;  Laterality: Right;  . ORCHIECTOMY Left 09/02/2018   Procedure: ORCHIECTOMY;  Surgeon: Kathie Rhodes, MD;  Location: [facility];  Service: Urology;  Laterality: Left;  . ROTATOR CUFF REPAIR Right 12/2004  . TRANSURETHRAL RESECTION OF BLADDER TUMOR N/A 08/09/2012   Procedure: TRANSURETHRAL RESECTION OF BLADDER TUMOR (TURBT) WITH GYRUS WITH MITOMYCIN C;  Surgeon: Claybon Jabs, MD;  Location: [facility];  Service: Urology;  Laterality: N/A;  . TRANSURETHRAL RESECTION OF BLADDER TUMOR N/A 03/29/2020   Procedure: TRANSURETHRAL RESECTION OF BLADDER TUMOR (TURBT) and post-op instillation of gemcitabine;  Surgeon: Janith Lima, MD;  Location: [facility];  Service: Urology;  Laterality: N/A;  . TRANSURETHRAL RESECTION OF BLADDER TUMOR WITH GYRUS (TURBT-GYRUS) N/A  02/27/2014   Procedure: TRANSURETHRAL RESECTION OF BLADDER TUMOR WITH GYRUS (TURBT-GYRUS);  Surgeon: Garnett Farm, MD;  Location: [facility];  Service: Urology;  Laterality: N/A;       Home Medications    Prior to Admission medications   Medication Sig Start Date End Date Taking? Authorizing Provider  ARIPiprazole (ABILIFY) 5 MG tablet Take 1 tablet (5 mg total) by mouth daily. 01/29/20   Arfeen, Phillips Grout, MD  Continuous Blood Gluc Sensor (FREESTYLE LIBRE 2 SENSOR) MISC 1 Device by Does not apply route every 14 (fourteen) days. 02/25/20   Romero Belling, MD  Dulaglutide (TRULICITY) 4.5 MG/0.5ML SOPN Inject 4.5 mg into the skin once a week. Patient taking differently: Inject 4.5 mg into the skin once a week. Thursday's 01/22/20   Romero Belling, MD  fenofibrate 160 MG tablet Take 160 mg by mouth daily. 12/31/19    [provider]  gabapentin (NEURONTIN) 600 MG tablet Take 600 mg by mouth 2 (two) times daily. 08/20/18   [provider]  hydroxychloroquine (PLAQUENIL) 200 MG tablet Take 200 mg by mouth 2 (two) times daily.    [provider]  insulin glargine, 2 Unit Dial, (TOUJEO MAX SOLOSTAR) 300 UNIT/ML Solostar Pen Inject 180 Units into the skin every morning. And pen needles 1/day Patient taking differently: Inject 180 Units into the skin every morning. And pen needles 1/day 02/25/20   Romero Belling, MD  isosorbide mononitrate (IMDUR) 120 MG 24 hr tablet Take 1 tablet (120 mg total) by mouth daily. Please make annual appt in April with Dr. Antoine Poche for refills. Thank you 05/05/19   Rollene Rotunda, MD  lamoTRIgine (LAMICTAL) 150 MG tablet Take 1 tablet (150 mg total) by mouth 2 (two) times daily. 04/24/19   Levert Feinstein, MD  levothyroxine (SYNTHROID, LEVOTHROID) 25 MCG tablet Take 25 mcg by mouth daily before breakfast.     [provider]  metFORMIN (GLUCOPHAGE-XR) 500 MG 24 hr tablet Take 500 mg by mouth 2 (two) times daily. 08/24/18   [provider]  metoCLOPramide (REGLAN) 10 MG tablet Take 10 mg by mouth in the morning, at noon, and at bedtime.     [provider]  morphine (MS CONTIN) 60 MG 12 hr tablet Take 60 mg by mouth 2 (two) times daily. Every 12 hours 06/12/19   [provider]  morphine (MSIR) 15 MG tablet Take 15 mg by mouth 2 (two) times daily as needed. For breakthrough pain  (03-26-2020 per pt wife pt takes this twice also 06/11/19   [provider]  omeprazole (PRILOSEC) 40 MG capsule Take 40 mg by mouth daily.    [provider]  Pancrelipase, Lip-Prot-Amyl, (ZENPEP) 5000-24000 units CPEP Take 1-2 capsules by mouth See admin instructions. Takes 1 capsule in the morning and 2 capsules at night    [provider]  pregabalin (LYRICA) 150 MG capsule Take 150-300 mg by mouth See admin instructions. Takes 1 tablet  in the morning and 2 tablets at night    [provider]  rosuvastatin (CRESTOR) 10 MG tablet Take 10 mg by mouth daily.    [provider]  sucralfate (CARAFATE) 1 g tablet Take 1 g by mouth 2 (two) times daily. 01/26/20   [provider]  tamsulosin (FLOMAX) 0.4 MG CAPS capsule Take 0.8 mg by mouth at bedtime.    [provider]  traZODone (DESYREL) 100 MG tablet Take 1 tablet (100 mg total) by mouth at bedtime. 01/29/20  Kathlee Nations, MD  venlafaxine XR (EFFEXOR-XR) 150 MG 24 hr capsule Take 1 capsule (150 mg total) by mouth daily with breakfast. 01/29/20   Arfeen, Arlyce Harman, MD  Vitamin D, Ergocalciferol, (DRISDOL) 1.25 MG (50000 UNIT) CAPS capsule Take 50,000 Units by mouth once a week. Wednesdays 12/26/19   [provider]  amLODipine (NORVASC) 5 MG tablet Take 5 mg by mouth daily. 04/21/19 08/09/19  [provider]    Family History Family History  Problem Relation Age of Onset  . Diabetes Mother   . Diabetes Father   . Hypertension Father   . Heart attack Father 46       died age 49  . Alcohol abuse Father   . Colon cancer Neg Hx   . Esophageal cancer Neg Hx   . Stomach cancer Neg Hx   . Rectal cancer Neg Hx     Social History Social History   Tobacco Use  . Smoking status: Current Every Day Smoker    Packs/day: 2.00    Years: 38.00    Pack years: 76.00    Types: Cigarettes  . Smokeless tobacco: Never Used  Vaping Use  . Vaping Use: Never used  Substance Use Topics  . Alcohol use: No  . Drug use: No     Allergies   Celebrex [celecoxib], Hydrocodone, and Sulfa antibiotics  Review of Systems Review of Systems Pertinent negatives listed in HPI  Physical Exam Triage Vital Signs ED Triage Vitals [04/19/20 1748]  Enc Vitals Group     BP (!) 148/77     Pulse Rate 85     Resp 18     Temp 97.8 F (36.6 C)     Temp Source Oral     SpO2 96 %     Weight      Height      Head Circumference      Peak Flow       Pain Score 8     Pain Loc      Pain Edu?      Excl. in [place]?    No data found.  Updated Vital Signs BP (!) 148/77 (BP Location: Left Arm)   Pulse 85   Temp 97.8 F (36.6 C) (Oral)   Resp 18   SpO2 96%   Visual Acuity Right Eye Distance:   Left Eye Distance:   Bilateral Distance:    Right Eye Near:   Left Eye Near:    Bilateral Near:     Physical Exam Constitutional:      General: He is not in acute distress.    Appearance: He is not diaphoretic.  Cardiovascular:     Rate and Rhythm: Normal rate.  Pulmonary:     Effort: Pulmonary effort is normal.     Breath sounds: Normal breath sounds.  Musculoskeletal:     Cervical back: No rigidity.     Comments: Left fifth digit deep laceration to the PIP joint purulent drainage present diffuse tenderness extending from the proximal to the distal region of the fifth digit. Diffuse swelling with ecchymosis.  Diminished sensation at the proximal base of the left finger.  Skin:    Capillary Refill: Capillary refill takes 2 to 3 seconds.  Neurological:     Mental Status: He is alert.     GCS: GCS eye subscore is 4. GCS verbal subscore is 5. GCS motor subscore is 6.     Coordination: Coordination is intact.     Gait:  Gait is intact.      UC Treatments / Results  Labs (all labs ordered are listed, but only abnormal results are displayed) Labs Reviewed - No data to display  EKG   Radiology No results found.  Procedures Procedures (including critical care time)  Medications Ordered in UC Medications - No data to display  Initial Impression / Assessment and Plan / UC Course  I have reviewed the triage vital signs and the nursing notes.  Pertinent labs & imaging results that were available during my care of the patient were reviewed by me and considered in my medical decision making (see chart for details).     Patient with a laceration involving the PIP region of left fifth finger patient has poorly controlled type 2  diabetes with diabetes neuropathy and high risk for vascular compromise.  Injury occurred over 6 days ago with gradually worsening symptoms over the last 6 days.  Urgent referral placed to Dr. Meridee Score for evaluation of vascular status.  Patient given 1 g of Rocephin while here in clinic and will be sent home on 14 days of doxycycline and advised do not discontinue taking until otherwise advised by Dr. Sharol Given.  Final Clinical Impressions(s) / UC Diagnoses   Final diagnoses:  Laceration of left little finger with damage to nail, foreign body presence unspecified, initial encounter  Infected wound  Type 2 diabetes mellitus with diabetic neuropathy, with long-term current use of insulin [facility]     Discharge Instructions     Start Doxycyline twice daily over the course of the next 14 days.  Do not discontinue taking antibiotics unless otherwise instructed by Dr. Sharol Given. Imaging is negative for any deep bone infection however given your lack of sensation and history of diabetes I would like for Dr. Sharol Given to follow you closely until this infection resolves.    ED Prescriptions    Medication Sig Dispense Auth. Provider   doxycycline (VIBRAMYCIN) 100 MG capsule Take 1 capsule (100 mg total) by mouth 2 (two) times daily for 14 days. 28 capsule Scot Jun, FNP     PDMP not reviewed this encounter.   Scot Jun, FNP 04/19/20 2000

## 2020-04-23 DIAGNOSIS — D414 Neoplasm of uncertain behavior of bladder: Secondary | ICD-10-CM | POA: Diagnosis not present

## 2020-04-24 ENCOUNTER — Other Ambulatory Visit (HOSPITAL_COMMUNITY): Payer: Self-pay | Admitting: Psychiatry

## 2020-04-24 ENCOUNTER — Other Ambulatory Visit: Payer: Self-pay | Admitting: Neurology

## 2020-04-24 DIAGNOSIS — F3181 Bipolar II disorder: Secondary | ICD-10-CM

## 2020-04-24 DIAGNOSIS — F4312 Post-traumatic stress disorder, chronic: Secondary | ICD-10-CM

## 2020-04-26 ENCOUNTER — Encounter (HOSPITAL_COMMUNITY): Payer: Self-pay | Admitting: Psychiatry

## 2020-04-26 ENCOUNTER — Other Ambulatory Visit: Payer: Self-pay

## 2020-04-26 ENCOUNTER — Other Ambulatory Visit: Payer: Self-pay | Admitting: Physician Assistant

## 2020-04-26 ENCOUNTER — Encounter: Payer: Self-pay | Admitting: Orthopedic Surgery

## 2020-04-26 ENCOUNTER — Telehealth (INDEPENDENT_AMBULATORY_CARE_PROVIDER_SITE_OTHER): Payer: No Typology Code available for payment source | Admitting: Psychiatry

## 2020-04-26 ENCOUNTER — Ambulatory Visit (INDEPENDENT_AMBULATORY_CARE_PROVIDER_SITE_OTHER): Payer: No Typology Code available for payment source | Admitting: Orthopedic Surgery

## 2020-04-26 VITALS — Ht 72.0 in | Wt 224.0 lb

## 2020-04-26 DIAGNOSIS — F4312 Post-traumatic stress disorder, chronic: Secondary | ICD-10-CM | POA: Diagnosis not present

## 2020-04-26 DIAGNOSIS — M869 Osteomyelitis, unspecified: Secondary | ICD-10-CM

## 2020-04-26 DIAGNOSIS — F3181 Bipolar II disorder: Secondary | ICD-10-CM | POA: Diagnosis not present

## 2020-04-26 MED ORDER — TRAZODONE HCL 100 MG PO TABS
100.0000 mg | ORAL_TABLET | Freq: Every day | ORAL | 0 refills | Status: DC
Start: 2020-04-26 — End: 2020-06-08

## 2020-04-26 MED ORDER — VENLAFAXINE HCL ER 150 MG PO CP24: 150.0000 mg | ORAL_CAPSULE | Freq: Every day | ORAL | 0 refills | Status: DC

## 2020-04-26 MED ORDER — ARIPIPRAZOLE 10 MG PO TABS: 10.0000 mg | ORAL_TABLET | Freq: Every day | ORAL | 0 refills | Status: DC

## 2020-04-26 NOTE — Progress Notes (Signed)
Virtual Visit via Telephone Note  I connected with Vincent [name] on 04/26/20 at 10:40 AM EST by telephone and verified that I am speaking with the correct person using two identifiers.  Location: Patient: Home Provider: Home Office   I discussed the limitations, risks, security and privacy concerns of performing an evaluation and management service by telephone and the availability of in person appointments. I also discussed with the patient that there may be a patient responsible charge related to this service. The patient expressed understanding and agreed to proceed.   History of Present Illness: Patient is evaluated by phone session.  He recently had surgery for his bladder and he is pleased surgery went well but now he has to do chemotherapy.  He noticed lately irritability, mood swings and anger.  He is not sure what the trigger because he gets easily frustrated and even minor things make him irritable.  He also having upcoming surgery for his left pinky amputated which got infected.  Patient see pain management and he takes a lot of medication which includes pain medication and muscle relaxant.  However he is sleeping good and he denies any nightmares or flashbacks.  He feels trazodone working and is able to get 6 to 7 hours sleep.  He is taking the Abilify but he does not feel Abilify helping as much as he had irritability and anger.  His 53 year old son moved out and that helps some of his stress but is still have moments.  He lives with his wife who is supportive.  Denies any suicidal thoughts, paranoia, hallucination and denies any drug use.  His appetite is okay.  His weight is stable.     Past Psychiatric History:Reviewed. H/Oone brief hospitalization at [facility] due to suicidal thoughts. No h/osuicidal attempt. H/Oanger, anxiety, PTSD and heavy substance use. Claims to be sober for while. Tried Depakote, Zoloft, lithium, Prozac and Xanax. [name] helped but stopped due  to polypharmacy.   Recent Results (from the past 2160 hour(s))  SARS CORONAVIRUS 2 (TAT 6-24 HRS) Nasopharyngeal Nasopharyngeal Swab     Status: None   Collection Time: 03/25/20  8:27 AM   Specimen: Nasopharyngeal Swab  Result Value Ref Range   SARS Coronavirus 2 NEGATIVE NEGATIVE    Comment: (NOTE) SARS-CoV-2 target nucleic acids are NOT DETECTED.  The SARS-CoV-2 RNA is generally detectable in upper and lower respiratory specimens during the acute phase of infection. Negative results do not preclude SARS-CoV-2 infection, do not rule out co-infections with other pathogens, and should not be used as the sole basis for treatment or other patient management decisions. Negative results must be combined with clinical observations, patient history, and epidemiological information. The expected result is Negative.  Fact Sheet for Patients: SugarRoll.be  Fact Sheet for Healthcare Providers: https://www.woods-mathews.com/  This test is not yet approved or cleared by the [place] FDA and  has been authorized for detection and/or diagnosis of SARS-CoV-2 by FDA under an Emergency Use Authorization (EUA). This EUA will remain  in effect (meaning this test can be used) for the duration of the COVID-19 declaration under Se ction 564(b)(1) of the Act, 21 U.S.C. section 360bbb-3(b)(1), unless the authorization is terminated or revoked sooner.  Performed at [facility], [place] [address] [place], [place] [address]   I-STAT, [place] 8     Status: Abnormal   Collection Time: 03/29/20 11:26 AM  Result Value Ref Range   Sodium 142 135 - 145 mmol/L   Potassium 4.8 3.5 -  5.1 mmol/L   Chloride 107 98 - 111 mmol/L   BUN 22 (H) 6 - 20 mg/dL   Creatinine, Ser 1.00 0.61 - 1.24 mg/dL   Glucose, Bld 232 (H) 70 - 99 mg/dL    Comment: Glucose reference range applies only to samples taken after fasting for at least 8 hours.   Calcium, Ion 1.32 1.15 - 1.40  mmol/L   TCO2 27 22 - 32 mmol/L   Hemoglobin 5.4 (LL) 13.0 - 17.0 g/dL    Comment: QUESTIONABLE RESULTS - CHARGE CREDITED REPEATED TO VERIFY Performed at [place] [address], [place], [place] [address]    HCT 16.0 (L) 39.0 - 52.0 %   Comment MD NOTIFIED, REPEAT TEST   I-STAT, chem 8     Status: Abnormal   Collection Time: 03/29/20 11:39 AM  Result Value Ref Range   Sodium 142 135 - 145 mmol/L   Potassium 4.2 3.5 - 5.1 mmol/L   Chloride 107 98 - 111 mmol/L   BUN 18 6 - 20 mg/dL   Creatinine, Ser 0.90 0.61 - 1.24 mg/dL   Glucose, Bld 216 (H) 70 - 99 mg/dL    Comment: Glucose reference range applies only to samples taken after fasting for at least 8 hours.   Calcium, Ion 1.27 1.15 - 1.40 mmol/L   TCO2 24 22 - 32 mmol/L   Hemoglobin 11.9 (L) 13.0 - 17.0 g/dL   HCT 35.0 (L) 39.0 - 52.0 %  Surgical pathology     Status: None   Collection Time: 03/29/20  1:30 PM  Result Value Ref Range   SURGICAL PATHOLOGY      SURGICAL PATHOLOGY CASE: [id number] PATIENT: Cammy Brochure Surgical Pathology Report     Clinical History: bladder cancer (rd)     FINAL MICROSCOPIC DIAGNOSIS:  A. BLADDER, TRIGONE TUMOR, TURBT, RESECTION: Noninvasive high grade papillary urothelial carcinoma  B. BLADDER, LEFT LATERAL WALL, TUMOR, TURBT, RESECTION: Detached low grade papillary urothelial carcinoma  C. BLADDER, POSTERIOR WALL TUMOR, TURBT, RESECTION: Noninvasive low grade papillary urothelial carcinoma   GROSS DESCRIPTION:  A: Received in formalin are 0.8 x 0.8 x 0.3 cm of soft tan-pink tissue. The specimen is submitted in toto.  B: Received in formalin are tan, soft tissue fragments that are submitted in toto. Number: 2 size: Each 0.3 cm blocks: 1  C: Received in formalin are fragments of soft tan-pink tissue which measure 0.7 x 0.7 x 0.2 cm in aggregate.  The specimen is submitted in toto.  [facility] 03/29/2020)   Final Diagnosis performed by Casimer Lanius, MD.    Electronically signed  03/30/2020 Technical and / or Professional components performed at [facility], [place] [address] [place], [place] [address].  Immunohistochemistry Technical component (if applicable) was performed at [facility]. [address], [place], [place], [place] [address].   IMMUNOHISTOCHEMISTRY DISCLAIMER (if applicable): Some of these immunohistochemical stains may have been developed and the performance characteristics determine by [facility]. Some may not have been cleared or approved by the U.S. Food and Drug Administration. The FDA has determined that such clearance or approval is not necessary. This test is used for clinical purposes. It should not be regarded as investigational or for research. This laboratory is certified under the [place] (CLIA-88) as qualified to perform high complexity clinical laboratory testing.  The controls stained appropriately.   Glucose, capillary     Status: None   Collection Time: 03/29/20  3:21 PM  Result Value Ref Range   Glucose-Capillary 72 70 - 99 mg/dL    Comment: Glucose reference range applies only to samples taken after fasting for at least 8 hours.   Comment 1 Document in Chart     Psychiatric Specialty Exam: Physical Exam  Review of Systems  Weight 224 lb (101.6 kg).There is no height or weight on file to calculate BMI.  General Appearance: NA  Eye Contact:  NA  Speech:  Slow  Volume:  Decreased  Mood:  Irritable  Affect:  NA  Thought Process:  Descriptions of Associations: Intact  Orientation:  Full (Time, Place, and Person)  Thought Content:  Rumination  Suicidal Thoughts:  No  Homicidal Thoughts:  No  Memory:  Immediate;   Good Recent;   Fair Remote;   Fair  Judgement:  Intact  Insight:  Shallow  Psychomotor Activity:  NA  Concentration:  Concentration: Fair and Attention Span: Fair  Recall:  [employer] of  Knowledge:  Good  Language:  Good  Akathisia:  No  Handed:  Left  AIMS (if indicated):     Assets:  Communication Skills Desire for Improvement Housing Social Support  ADL's:  Intact  Cognition:  WNL  Sleep:   ok      Assessment and Plan: Bipolar disorder type II.  PTSD.  I reviewed blood work results.  Patient recently had surgery for bladder cancer and now having chemotherapy.  He is also having his left pinky amputation which got infected and have blood clot issues.  We talked about increasing Abilify since it is having a lot of irritability and anger.  Currently he is taking 5 mg we will increase to 10 mg.  However if his mood do not improve with a higher dose of Abilify then we will consider switching to either [place] or Vraylar.  He will continue trazodone 100 mg since it is helping his nightmares.  Continue Effexor and 50 mg daily.  Recommended to call us back if is any question or any concern.  He is taking Lamictal, Lyrica, gabapentin from other provider.  He is also seeing pain management.  Discussed medication side effects and benefits specially higher dose of Abilify can cause high blood sugar.  Follow-up in  4 weeks.   Follow Up Instructions:    I discussed the assessment and treatment plan with the patient. The patient was provided an opportunity to ask questions and all were answered. The patient agreed with the plan and demonstrated an understanding of the instructions.   The patient was advised to call back or seek an in-person evaluation if the symptoms worsen or if the condition fails to improve as anticipated.  I provided 25 minutes of non-face-to-face time during this encounter.   Kathlee Nations, MD

## 2020-04-26 NOTE — Progress Notes (Signed)
Office Visit Note   Patient: Vincent [name]           Date of Birth: 01/09/1966           MRN: [id number] Visit Date: 04/26/2020              Requested by: Hayden Rasmussen, MD [address],  [place] [address] PCP: Hayden Rasmussen, MD  Chief Complaint  Patient presents with  . Left Hand - Pain      HPI: Patient is a 55 year old gentleman with poorly controlled type 2 diabetes who states he developed an ulcer on the left little finger about 6 months ago.  He states that he then smashed it at work he went to urgent care 1 week ago and was started on doxycycline and is seen for initial evaluation today.  Assessment & Plan: Visit Diagnoses:  1. Osteomyelitis of finger of left hand ([place])     Plan: With complete destruction of the extensor mechan at the PIP joint and destructive osteomyelitis of the proximal phalanx patient will require an amputation of the little finger at the MCP joint.  Discussed the importance of improved glucose control and will plan for outpatient surgery on Wednesday.  Follow-Up Instructions: Return in about 1 week (around 05/03/2020).   Ortho Exam  Patient is alert, oriented, no adenopathy, well-dressed, normal affect, normal respiratory effort. Examination patient has a good radial and ulnar pulse he has no active flexion or extension of the left little finger the finger is cellulitic swollen with complete destruction of the extensor mechanism.  There is necrotic bone changes of the proximal phalanx at the PIP joint.  Patient's hemoglobin A1c has ranged from 13 to greater than 15.5.  There is no sign of infection proximal to the MCP joint.  Imaging: No results found. No images are attached to the encounter.  Labs: Lab Results  Component Value Date   HGBA1C 13.0 (A) 01/22/2020   HGBA1C >15.5 (H) 01/08/2020   HGBA1C  01/05/2020     Comment:     To high to result   ESRSEDRATE 2 04/24/2019   CRP 1 04/24/2019   REPTSTATUS  03/06/2014 FINAL 03/04/2014   CULT NO GROWTH Performed at [facility]  03/04/2014     Lab Results  Component Value Date   ALBUMIN 4.5 06/05/2019   ALBUMIN 4.2 10/19/2018   ALBUMIN 3.9 05/17/2017    Lab Results  Component Value Date   MG 1.6 (L) 08/09/2019   MG 2.2 01/03/2018   Lab Results  Component Value Date   VD25OH 20.4 (L) 04/24/2019   VD25OH 22.5 (L) 11/22/2016    No results found for: PREALBUMIN CBC EXTENDED Latest Ref Rng & Units 03/29/2020 03/29/2020 01/07/2020  WBC 4.0 - 10.5 K/uL - - -  RBC 4.22 - 5.81 MIL/uL - - -  HGB 13.0 - 17.0 g/dL 11.9(L) 5.4(LL) 11.2(L)  HCT 39.0 - 52.0 % 35.0(L) 16.0(L) 33.0(L)  PLT 150 - 400 K/uL - - -  NEUTROABS 1.7 - 7.7 K/uL - - -  LYMPHSABS 0.7 - 4.0 K/uL - - -     Body mass index is 30.38 kg/m.  Orders:  No orders of the defined types were placed in this encounter.  No orders of the defined types were placed in this encounter.    Procedures: No procedures performed  Clinical Data: No additional findings.  ROS:  All other systems negative, except as noted in the HPI.  Review of Systems  Objective: Vital Signs: Ht 6' (1.829 m)   Wt 224 lb (101.6 kg)   BMI 30.38 kg/m   Specialty Comments:  No specialty comments available.  PMFS History: Patient Active Problem List   Diagnosis Date Noted  . Pain due to onychomycosis of toenails of both feet 01/28/2020  . Hyperglycemia due to type 2 diabetes mellitus ([place]) 01/07/2020  . Resistance to insulin 01/07/2020  . Right leg weakness 04/24/2019  . Right leg pain 04/24/2019  . Gait abnormality 04/24/2019  . TIA (transient ischemic attack) 10/20/2018  . Uncontrolled type 2 DM with hyperosmolar nonketotic hyperglycemia ([place]) 10/19/2018  . Left sided numbness 10/19/2018  . Diabetic autonomic neuropathy associated with type 2 diabetes mellitus ([place]) 02/01/2018  . DM type 2 with diabetic peripheral neuropathy ([place]) 02/01/2018  . Dyslipidemia 01/31/2018  .  Coronary artery disease involving native coronary artery of native heart without angina pectoris 01/31/2018  . Chest pain 01/01/2018  . Seizure disorder ([place]) 12/19/2016  . Essential tremor 12/19/2016  . Polypharmacy 12/19/2016  . Chronic post-traumatic stress disorder (PTSD) 06/16/2016  . Tremor 06/16/2016  . Tobacco use disorder 05/05/2016  . Psychophysiological insomnia 06/02/2015  . Altered mental status 05/28/2015  . Acute bronchitis 05/28/2015  . Type 2 diabetes mellitus with hyperglycemia ([place]) 05/28/2015  . Hypothyroidism 05/28/2015  . Bipolar 2 disorder ([place]) 05/28/2015  . History of rheumatoid arthritis 05/28/2015  . Chronic pain 05/28/2015  . Depression 03/18/2015  . Carpal tunnel syndrome on left 02/10/2015  . Carpal tunnel syndrome on right 02/10/2015  . Diabetes ([place]) 01/23/2015  . Obesity (BMI 30-39.9) 03/06/2014  . Acute encephalopathy 03/05/2014  . HLD (hyperlipidemia) 03/05/2014  . Anemia, normocytic normochromic 03/05/2014  . Altered mental state   . Helicobacter pylori (H. pylori) infection 11/20/2012  . Protein-calorie malnutrition, severe ([place]) 10/09/2012  . Dysphagia 08/19/2012  . Gastroparesis 08/19/2012  . Bladder tumor 08/08/2012  . Biliary dyskinesia 11/02/2010   Past Medical History:  Diagnosis Date  . Anxiety   . Benign essential tremor   . Bipolar 2 disorder ([place])    followed by [facility]--- dr s. Adele Schilder  . Bladder cancer (HCC)    recurrent  . CAD (coronary artery disease)    cardiac cath 2003  and 2011 both showed normal coronary arteries w/ preserved lvf;  Non obstructive on CTA Oct 2019.   Marland Kitchen Chronic pain syndrome    back---- [facility] [place]  . Cold extremities    BLE  . COPD (chronic obstructive pulmonary disease) ([place])   . DDD (degenerative disc disease), lumbar   . Diabetic peripheral neuropathy ([place])   . Gastroparesis    followed by dr Henrene Pastor  . GERD (gastroesophageal reflux disease)   . Hiatal hernia   . History  of bladder cancer urologist-  previously dr Consuella Lose;  now dr gay   papillay TCC (Ta G1)  s/p TURBT and chemo instillation 2014  . History of chest pain 12/2017   heart cath normal  . History of encephalopathy 05/27/2015   admission w/ acute encephalopathy thought to be secondary to pain meds and COPD  . History of gastric ulcer   . History of Helicobacter pylori infection   . History of kidney stones   . History of TIA (transient ischemic attack) 2008  and 10-19-2018    no residual's  . History of traumatic head injury 2010   w/ LOC  per pt needed stitches, hit in head with a mower  blade  . Hyperlipidemia   . Hypertension   . Hypogonadism male    s/p  bilateral orchiectomy  . Hypothyroidism   . Insomnia   . Mild obstructive sleep apnea    study in epic 12-04-2016, no cpap  . PTSD (post-traumatic stress disorder)    chronic  . RA (rheumatoid arthritis) ([place])    [facility] by [facility]  . Seizures, transient [facility]) neurologist-  dr Krista Blue--  differential dx complex partial seizure .vs.  mood disorder .vs.  pseudoseizure--  negative EEG's   confusion episodes and staring spells since 11/ 2015   (03-26-2020 per pt wife last seizure 10 /2021)  . Transient confusion NEUOROLOGIST-  DR YAN   Episodes since 11/ 2015--  neurologist dx  differential complex partial seizure  .vs. mood disorder . vs. pseudoseizure  . Type 2 diabetes mellitus treated with insulin [facility])    endocrinologist--- dr Loanne Drilling---  (03-26-2020 pt does not check blood sugar at home)    Family History  Problem Relation Age of Onset  . Diabetes Mother   . Diabetes Father   . Hypertension Father   . Heart attack Father 90       died age 3  . Alcohol abuse Father   . Colon cancer Neg Hx   . Esophageal cancer Neg Hx   . Stomach cancer Neg Hx   . Rectal cancer Neg Hx     Past Surgical History:  Procedure Laterality Date  . CARDIAC CATHETERIZATION  12-27-2001  DR Einar Gip  &  05-26-2009  DR Irish Lack   RESULTS  FOR BOTH ARE NORMAL CORONARIES AND PERSERVED LVF/ EF 60%  . CARPAL TUNNEL RELEASE Bilateral right 09-16-2003;  left ?  . CARPAL TUNNEL RELEASE Left 02/25/2015   Procedure: LEFT CARPAL TUNNEL RELEASE;  Surgeon: Leanora Cover, MD;  Location: [place];  Service: Orthopedics;  Laterality: Left;  . CYSTOSCOPY N/A 10/10/2012   Procedure: CYSTOSCOPY CLOT EVACUATION FULGERATION OF BLEEDERS ;  Surgeon: Claybon Jabs, MD;  Location: [facility];  Service: Urology;  Laterality: N/A;  . CYSTOSCOPY WITH BIOPSY N/A 11/26/2015   Procedure: CYSTOSCOPY WITH BIOPSY AND FULGURATION;  Surgeon: Kathie Rhodes, MD;  Location: [place];  Service: Urology;  Laterality: N/A;  . ESOPHAGOGASTRODUODENOSCOPY  2014  . LAPAROSCOPIC CHOLECYSTECTOMY  11-17-2010  . ORCHIECTOMY Right 02/21/2016   Procedure: SCROTAL ORCHIECTOMY with TESTICULAR PROSTHESIS IMPLANT;  Surgeon: Kathie Rhodes, MD;  Location: [facility];  Service: Urology;  Laterality: Right;  . ORCHIECTOMY Left 09/02/2018   Procedure: ORCHIECTOMY;  Surgeon: Kathie Rhodes, MD;  [facility];  Service: Urology;  Laterality: Left;  . ROTATOR CUFF REPAIR Right 12/2004  . TRANSURETHRAL RESECTION OF BLADDER TUMOR N/A 08/09/2012   Procedure: TRANSURETHRAL RESECTION OF BLADDER TUMOR (TURBT) WITH GYRUS WITH MITOMYCIN C;  Surgeon: Claybon Jabs, MD;  Location: [facility];  Service: Urology;  Laterality: N/A;  . TRANSURETHRAL RESECTION OF BLADDER TUMOR N/A 03/29/2020   Procedure: TRANSURETHRAL RESECTION OF BLADDER TUMOR (TURBT) and post-op instillation of gemcitabine;  Surgeon: Janith Lima, MD;  Location: [facility];  Service: Urology;  Laterality: N/A;  . TRANSURETHRAL RESECTION OF BLADDER TUMOR WITH GYRUS (TURBT-GYRUS) N/A 02/27/2014   Procedure: TRANSURETHRAL RESECTION OF BLADDER TUMOR WITH GYRUS (TURBT-GYRUS);  Surgeon: Claybon Jabs, MD;  Location[facility];  Service: Urology;  Laterality: N/A;   Social History   Occupational History  . 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 Smoking status: Current Every Day Smoker    Packs/day: 2.00    Years: 38.00    Pack years: 76.00    Types: Cigarettes  . Smokeless tobacco: Never Used  Vaping Use  . Vaping Use: Never used  Substance and Sexual Activity  . Alcohol use: No  . Drug use: No  . Sexual activity: Not Currently    Partners: Female    Birth control/protection: None

## 2020-04-27 ENCOUNTER — Other Ambulatory Visit (HOSPITAL_COMMUNITY)
Admission: RE | Admit: 2020-04-27 | Discharge: 2020-04-27 | Disposition: A | Discharge status: Home / Self Care | Payer: No Typology Code available for payment source | Source: Ambulatory Visit | Attending: Orthopedic Surgery | Admitting: Orthopedic Surgery

## 2020-04-27 ENCOUNTER — Other Ambulatory Visit: Payer: Self-pay

## 2020-04-27 ENCOUNTER — Encounter (HOSPITAL_COMMUNITY): Payer: Self-pay | Admitting: Orthopedic Surgery

## 2020-04-27 DIAGNOSIS — Z01812 Encounter for preprocedural laboratory examination: Secondary | ICD-10-CM | POA: Diagnosis not present

## 2020-04-27 DIAGNOSIS — Z20822 Contact with and (suspected) exposure to covid-19: Secondary | ICD-10-CM | POA: Diagnosis not present

## 2020-04-27 LAB — SARS CORONAVIRUS 2 (TAT 6-24 HRS): SARS Coronavirus 2: NEGATIVE

## 2020-04-27 NOTE — Progress Notes (Signed)
Mr. Vincent [name] denies chest pain or shortrness of breath. Patient tested negative for Covid_2/15/22_ and has been in quarantine since that time.  Mr. Vincent [name] has type II diabetes, patient reports that CBGs run in the high 200's. Patient reports that last A1C was 10-.  I instructed Mr. Vincent [name] to not take Metformin in am, if CBG is greater than 70 take 90 units of Toujeo. I instructed patient to check CBG after awaking and every 2 hours until arrival  to the hospital.  I Instructed patient if CBG is less than 70 to take 1 tube of 1/2 cup of a clear juice. Recheck CBG in 15 minutes then call pre- op desk at [phone] for further instructions. If scheduled to receive Insulin, do not take Insulin.

## 2020-04-28 ENCOUNTER — Ambulatory Visit (HOSPITAL_COMMUNITY): Payer: No Typology Code available for payment source | Admitting: Certified Registered"

## 2020-04-28 ENCOUNTER — Ambulatory Visit: Payer: No Typology Code available for payment source | Admitting: Endocrinology

## 2020-04-28 ENCOUNTER — Ambulatory Visit (HOSPITAL_COMMUNITY)
Admission: RE | Admit: 2020-04-28 | Discharge: 2020-04-28 | Disposition: A | Discharge status: Home / Self Care | Payer: No Typology Code available for payment source | Attending: Orthopedic Surgery | Admitting: Orthopedic Surgery

## 2020-04-28 ENCOUNTER — Encounter (HOSPITAL_COMMUNITY): Payer: Self-pay | Admitting: Orthopedic Surgery

## 2020-04-28 ENCOUNTER — Encounter (HOSPITAL_COMMUNITY)
Admission: RE | Disposition: A | Discharge status: Home / Self Care | Payer: Self-pay | Source: Home / Self Care | Attending: Orthopedic Surgery

## 2020-04-28 DIAGNOSIS — Z794 Long term (current) use of insulin: Secondary | ICD-10-CM | POA: Diagnosis not present

## 2020-04-28 DIAGNOSIS — M869 Osteomyelitis, unspecified: Secondary | ICD-10-CM | POA: Diagnosis not present

## 2020-04-28 DIAGNOSIS — E1169 Type 2 diabetes mellitus with other specified complication: Secondary | ICD-10-CM | POA: Insufficient documentation

## 2020-04-28 DIAGNOSIS — Z885 Allergy status to narcotic agent status: Secondary | ICD-10-CM | POA: Diagnosis not present

## 2020-04-28 DIAGNOSIS — Z882 Allergy status to sulfonamides status: Secondary | ICD-10-CM | POA: Diagnosis not present

## 2020-04-28 DIAGNOSIS — I96 Gangrene, not elsewhere classified: Secondary | ICD-10-CM

## 2020-04-28 DIAGNOSIS — Z886 Allergy status to analgesic agent status: Secondary | ICD-10-CM | POA: Insufficient documentation

## 2020-04-28 DIAGNOSIS — F17210 Nicotine dependence, cigarettes, uncomplicated: Secondary | ICD-10-CM | POA: Diagnosis not present

## 2020-04-28 HISTORY — PX: AMPUTATION: SHX166

## 2020-04-28 LAB — BASIC METABOLIC PANEL
Anion gap: 9 (ref 5–15)
BUN: 23 mg/dL — ABNORMAL HIGH (ref 6–20)
CO2: 23 mmol/L (ref 22–32)
Calcium: 9.5 mg/dL (ref 8.9–10.3)
Chloride: 109 mmol/L (ref 98–111)
Creatinine, Ser: 1.02 mg/dL (ref 0.61–1.24)
GFR, Estimated: >60 mL/min (ref >60)
Glucose, Bld: 153 mg/dL — ABNORMAL HIGH (ref 70–99)
Potassium: 4.4 mmol/L (ref 3.5–5.1)
Sodium: 141 mmol/L (ref 135–145)

## 2020-04-28 LAB — CBC
HCT: 39 % (ref 39.0–52.0)
Hemoglobin: 13.3 g/dL (ref 13.0–17.0)
MCH: 28.1 pg (ref 26.0–34.0)
MCHC: 34.1 g/dL (ref 30.0–36.0)
MCV: 82.5 fL (ref 80.0–100.0)
Platelets: 311 10*3/uL (ref 150–400)
RBC: 4.73 MIL/uL (ref 4.22–5.81)
RDW: 12.5 % (ref 11.5–15.5)
WBC: 8.1 10*3/uL (ref 4.0–10.5)
nRBC: 0 % (ref 0.0–0.2)

## 2020-04-28 LAB — GLUCOSE, CAPILLARY
Glucose-Capillary: 151 mg/dL — ABNORMAL HIGH (ref 70–99)
Glucose-Capillary: 106 mg/dL — ABNORMAL HIGH (ref 70–99)
Glucose-Capillary: 66 mg/dL — ABNORMAL LOW (ref 70–99)

## 2020-04-28 SURGERY — AMPUTATION DIGIT
Anesthesia: Monitor Anesthesia Care | Site: Finger | Laterality: Left

## 2020-04-28 MED ORDER — CEFAZOLIN SODIUM-DEXTROSE 2-4 GM/100ML-% IV SOLN
2.0000 g | INTRAVENOUS | Status: AC
  Administered 2020-04-28: 2 g via INTRAVENOUS
  Filled 2020-04-28: qty 100

## 2020-04-28 MED ORDER — CHLORHEXIDINE GLUCONATE 0.12 % MT SOLN
OROMUCOSAL | Status: AC
  Administered 2020-04-28: 15 mL via OROMUCOSAL
  Filled 2020-04-28: qty 15

## 2020-04-28 MED ORDER — ORAL CARE MOUTH RINSE: 15.0000 mL | Freq: Once | OROMUCOSAL | Status: AC

## 2020-04-28 MED ORDER — OXYCODONE HCL 5 MG/5ML PO SOLN: 5.0000 mg | Freq: Once | ORAL | Status: DC | PRN

## 2020-04-28 MED ORDER — PROPOFOL 10 MG/ML IV BOLUS
INTRAVENOUS | Status: AC
  Filled 2020-04-28: qty 20

## 2020-04-28 MED ORDER — FENTANYL CITRATE (PF) 250 MCG/5ML IJ SOLN
INTRAMUSCULAR | Status: AC
  Filled 2020-04-28: qty 5

## 2020-04-28 MED ORDER — DEXTROSE 50 % IV SOLN
12.5000 g | INTRAVENOUS | Status: AC
  Administered 2020-04-28: 12.5 g via INTRAVENOUS

## 2020-04-28 MED ORDER — PROPOFOL 500 MG/50ML IV EMUL
INTRAVENOUS | Status: DC | PRN
  Administered 2020-04-28: 100 ug/kg/min via INTRAVENOUS

## 2020-04-28 MED ORDER — ONDANSETRON HCL 4 MG/2ML IJ SOLN
INTRAMUSCULAR | Status: DC | PRN
  Administered 2020-04-28: 4 mg via INTRAVENOUS

## 2020-04-28 MED ORDER — MIDAZOLAM HCL 2 MG/2ML IJ SOLN
INTRAMUSCULAR | Status: AC
  Filled 2020-04-28: qty 2

## 2020-04-28 MED ORDER — DEXTROSE 50 % IV SOLN
INTRAVENOUS | Status: AC
  Filled 2020-04-28: qty 50

## 2020-04-28 MED ORDER — OXYCODONE HCL 5 MG PO TABS: 5.0000 mg | ORAL_TABLET | Freq: Once | ORAL | Status: DC | PRN

## 2020-04-28 MED ORDER — FENTANYL CITRATE (PF) 250 MCG/5ML IJ SOLN
INTRAMUSCULAR | Status: DC | PRN
  Administered 2020-04-28: 25 ug via INTRAVENOUS
  Administered 2020-04-28: 50 ug via INTRAVENOUS

## 2020-04-28 MED ORDER — LIDOCAINE HCL (PF) 1 % IJ SOLN
INTRAMUSCULAR | Status: AC
  Filled 2020-04-28: qty 30

## 2020-04-28 MED ORDER — FENTANYL CITRATE (PF) 100 MCG/2ML IJ SOLN: 25.0000 ug | INTRAMUSCULAR | Status: DC | PRN

## 2020-04-28 MED ORDER — LIDOCAINE HCL 1 % IJ SOLN
INTRAMUSCULAR | Status: DC | PRN
  Administered 2020-04-28: 10 mL via INTRADERMAL

## 2020-04-28 MED ORDER — LACTATED RINGERS IV SOLN: INTRAVENOUS | Status: DC

## 2020-04-28 MED ORDER — MIDAZOLAM HCL 5 MG/5ML IJ SOLN
INTRAMUSCULAR | Status: DC | PRN
  Administered 2020-04-28: 2 mg via INTRAVENOUS

## 2020-04-28 MED ORDER — CHLORHEXIDINE GLUCONATE 0.12 % MT SOLN: 15.0000 mL | Freq: Once | OROMUCOSAL | Status: AC

## 2020-04-28 MED ORDER — PROPOFOL 10 MG/ML IV BOLUS
INTRAVENOUS | Status: DC | PRN
  Administered 2020-04-28 (×2): 20 mg via INTRAVENOUS

## 2020-04-28 MED ORDER — PROMETHAZINE HCL 25 MG/ML IJ SOLN
6.2500 mg | INTRAMUSCULAR | Status: DC | PRN
Start: 2020-04-28 — End: 2020-04-28

## 2020-04-28 SURGICAL SUPPLY — 29 items
BLADE SURG 21 STRL SS (BLADE) ×2 IMPLANT
BNDG CMPR 9X4 STRL LF SNTH (GAUZE/BANDAGES/DRESSINGS)
BNDG COHESIVE 4X5 TAN STRL (GAUZE/BANDAGES/DRESSINGS) ×1 IMPLANT
BNDG ESMARK 4X9 LF (GAUZE/BANDAGES/DRESSINGS) IMPLANT
BNDG GAUZE ELAST 4 BULKY (GAUZE/BANDAGES/DRESSINGS) ×1 IMPLANT
COVER SURGICAL LIGHT HANDLE (MISCELLANEOUS) ×4 IMPLANT
COVER WAND RF STERILE (DRAPES) ×2 IMPLANT
DRAPE U-SHAPE 47X51 STRL (DRAPES) ×2 IMPLANT
DRSG ADAPTIC 3X8 NADH LF (GAUZE/BANDAGES/DRESSINGS) IMPLANT
DRSG PAD ABDOMINAL 8X10 ST (GAUZE/BANDAGES/DRESSINGS) ×2 IMPLANT
DURAPREP 26ML APPLICATOR (WOUND CARE) ×2 IMPLANT
ELECT REM PT RETURN 9FT ADLT (ELECTROSURGICAL) ×2
ELECTRODE REM PT RTRN 9FT ADLT (ELECTROSURGICAL) ×1 IMPLANT
GAUZE SPONGE 4X4 12PLY STRL (GAUZE/BANDAGES/DRESSINGS) IMPLANT
GLOVE BIOGEL PI IND STRL 9 (GLOVE) ×1 IMPLANT
GLOVE BIOGEL PI INDICATOR 9 (GLOVE) ×1
GLOVE SURG ORTHO 9.0 STRL STRW (GLOVE) ×2 IMPLANT
GOWN STRL REUS W/ TWL XL LVL3 (GOWN DISPOSABLE) ×2 IMPLANT
GOWN STRL REUS W/TWL XL LVL3 (GOWN DISPOSABLE) ×4
KIT BASIN OR (CUSTOM PROCEDURE TRAY) ×2 IMPLANT
KIT TURNOVER KIT B (KITS) ×2 IMPLANT
MANIFOLD NEPTUNE II (INSTRUMENTS) ×2 IMPLANT
NEEDLE 22X1 1/2 (OR ONLY) (NEEDLE) ×1 IMPLANT
NS IRRIG 1000ML POUR BTL (IV SOLUTION) ×2 IMPLANT
PACK ORTHO EXTREMITY (CUSTOM PROCEDURE TRAY) ×2 IMPLANT
PAD ARMBOARD 7.5X6 YLW CONV (MISCELLANEOUS) ×4 IMPLANT
SUT ETHILON 2 0 PSLX (SUTURE) ×2 IMPLANT
SYR CONTROL 10ML LL (SYRINGE) ×1 IMPLANT
TOWEL GREEN STERILE (TOWEL DISPOSABLE) ×2 IMPLANT

## 2020-04-28 NOTE — H&P (Signed)
Vincent [name] is an 55 y.o. male.   Chief Complaint: Osteomyelitis Finger HPI: Patient is a 55 year old gentleman with poorly controlled type 2 diabetes who states he developed an ulcer on the left little finger about 6 months ago.  He states that he then smashed it at work he went to urgent care 1 week ago and was started on doxycycline and is seen for initial evaluation today.  Past Medical History:  Diagnosis Date  . Anxiety   . Benign essential tremor   . Bipolar 2 disorder ([place])    followed by [facility]--- dr s. Adele Schilder  . Bladder cancer (HCC)    recurrent  . CAD (coronary artery disease)    cardiac cath 2003  and 2011 both showed normal coronary arteries w/ preserved lvf;  Non obstructive on CTA Oct 2019.   Marland Kitchen Chronic pain syndrome    back---- [facility] [place]  . Cold extremities    BLE  . COPD (chronic obstructive pulmonary disease) ([place])   . DDD (degenerative disc disease), lumbar   . Diabetic peripheral neuropathy ([place])   . Gastroparesis    followed by dr Henrene Pastor  . GERD (gastroesophageal reflux disease)   . Hiatal hernia   . History of bladder cancer urologist-  previously dr Consuella Lose;  now dr gay   papillay TCC (Ta G1)  s/p TURBT and chemo instillation 2014  . History of chest pain 12/2017   heart cath normal  . History of encephalopathy 05/27/2015   admission w/ acute encephalopathy thought to be secondary to pain meds and COPD  . History of gastric ulcer   . History of Helicobacter pylori infection   . History of kidney stones   . History of TIA (transient ischemic attack) 2008  and 10-19-2018    no residual's  . History of traumatic head injury 2010   w/ LOC  per pt needed stitches, hit in head with a mower blade  . Hyperlipidemia   . Hypertension   . Hypogonadism male    s/p  bilateral orchiectomy  . Hypothyroidism   . Insomnia   . Mild obstructive sleep apnea    study in epic 12-04-2016, no cpap  . PTSD (post-traumatic stress disorder)     chronic  . PTSD (post-traumatic stress disorder)   . RA (rheumatoid arthritis) ([place])    followed by [facility]  . Seizures, transient [facility]) neurologist-  dr Krista Blue--  differential dx complex partial seizure .vs.  mood disorder .vs.  pseudoseizure--  negative EEG's   confusion episodes and staring spells since 11/ 2015   (03-26-2020 per pt wife last seizure 10 /2021)  . Transient confusion NEUOROLOGIST-  DR YAN   Episodes since 11/ 2015--  neurologist dx  differential complex partial seizure  .vs. mood disorder . vs. pseudoseizure  . Type 2 diabetes mellitus treated with insulin [facility])    endocrinologist--- dr Loanne Drilling---  (03-26-2020 pt does not check blood sugar at home)    Past Surgical History:  Procedure Laterality Date  . CARDIAC CATHETERIZATION  12-27-2001  DR Einar Gip  &  05-26-2009  DR Irish Lack   RESULTS FOR BOTH ARE NORMAL CORONARIES AND PERSERVED LVF/ EF 60%  . CARPAL TUNNEL RELEASE Bilateral right 09-16-2003;  left ?  . CARPAL TUNNEL RELEASE Left 02/25/2015   Procedure: LEFT CARPAL TUNNEL RELEASE;  Surgeon: Leanora Cover, MD;  Location: [place];  Service: Orthopedics;  Laterality: Left;  . CYSTOSCOPY N/A 10/10/2012   Procedure:  CYSTOSCOPY CLOT EVACUATION FULGERATION OF BLEEDERS ;  Surgeon: Claybon Jabs, MD;  Location: [facility];  Service: Urology;  Laterality: N/A;  . CYSTOSCOPY WITH BIOPSY N/A 11/26/2015   Procedure: CYSTOSCOPY WITH BIOPSY AND FULGURATION;  Surgeon: Kathie Rhodes, MD;  Location: [place];  Service: Urology;  Laterality: N/A;  . ESOPHAGOGASTRODUODENOSCOPY  2014  . LAPAROSCOPIC CHOLECYSTECTOMY  11-17-2010  . ORCHIECTOMY Right 02/21/2016   Procedure: SCROTAL ORCHIECTOMY with TESTICULAR PROSTHESIS IMPLANT;  Surgeon: Kathie Rhodes, MD;  Location[facility];  Service: Urology;  Laterality: Right;  . ORCHIECTOMY Left 09/02/2018   Procedure: ORCHIECTOMY;  Surgeon: Kathie Rhodes, MD;  [facility]  [facility];  Service: Urology;  Laterality: Left;  . ROTATOR CUFF REPAIR Right 12/2004  . TRANSURETHRAL RESECTION OF BLADDER TUMOR N/A 08/09/2012   Procedure: TRANSURETHRAL RESECTION OF BLADDER TUMOR (TURBT) WITH GYRUS WITH MITOMYCIN C;  Surgeon: Claybon Jabs, MD;  Location[facility];  Service: Urology;  Laterality: N/A;  . TRANSURETHRAL RESECTION OF BLADDER TUMOR N/A 03/29/2020   Procedure: TRANSURETHRAL RESECTION OF BLADDER TUMOR (TURBT) and post-op instillation of gemcitabine;  Surgeon: Janith Lima, MD;  Location: [facility];  Service: Urology;  Laterality: N/A;  . TRANSURETHRAL RESECTION OF BLADDER TUMOR WITH GYRUS (TURBT-GYRUS) N/A 02/27/2014   Procedure: TRANSURETHRAL RESECTION OF BLADDER TUMOR WITH GYRUS (TURBT-GYRUS);  Surgeon: Claybon Jabs, MD;  Location: [facility];  Service: Urology;  Laterality: N/A;    Family History  Problem Relation Age of Onset  . Diabetes Mother   . Diabetes Father   . Hypertension Father   . Heart attack Father 91       died age 55  . Alcohol abuse Father   . Colon cancer Neg Hx   . Esophageal cancer Neg Hx   . Stomach cancer Neg Hx   . Rectal cancer Neg Hx    Social History:  reports that he has been smoking cigarettes. He has a 57.00 pack-year smoking history. He has never used smokeless tobacco. He reports that he does not drink alcohol and does not use drugs.  Allergies:  Allergies  Allergen Reactions  . Celebrex [Celecoxib] Anaphylaxis and Rash  . Hydrocodone Rash and Other (See Comments)    "blisters developed on arms"  . Sulfa Antibiotics Rash    No medications prior to admission.    Results for orders placed or performed during the hospital encounter of 04/27/20 (from the past 48 hour(s))  SARS CORONAVIRUS 2 (TAT 6-24 HRS) Nasopharyngeal Nasopharyngeal Swab     Status: None   Collection Time: 04/27/20  8:39 AM   Specimen: Nasopharyngeal Swab  Result Value Ref  Range   SARS Coronavirus 2 NEGATIVE NEGATIVE    Comment: (NOTE) SARS-CoV-2 target nucleic acids are NOT DETECTED.  The SARS-CoV-2 RNA is generally detectable in upper and lower respiratory specimens during the acute phase of infection. Negative results do not preclude SARS-CoV-2 infection, do not rule out co-infections with other pathogens, and should not be used as the sole basis for treatment or other patient management decisions. Negative results must be combined with clinical observations, patient history, and epidemiological information. The expected result is Negative.  Fact Sheet for Patients: SugarRoll.be  Fact Sheet for Healthcare Providers: https://www.woods-mathews.com/  This test is not yet approved or cleared by the [place] FDA and  has been authorized for detection and/or diagnosis of SARS-CoV-2 by FDA under an Emergency Use Authorization (EUA). This  EUA will remain  in effect (meaning this test can be used) for the duration of the COVID-19 declaration under Se ction 564(b)(1) of the Act, 21 U.S.C. section 360bbb-3(b)(1), unless the authorization is terminated or revoked sooner.  Performed at [facility], [place] [address] [place], [place] [address]    No results found.  Review of Systems  All other systems reviewed and are negative.   There were no vitals taken for this visit. Physical Exam  Patient is alert, oriented, no adenopathy, well-dressed, normal affect, normal respiratory effort. Examination patient has a good radial and ulnar pulse he has no active flexion or extension of the left little finger the finger is cellulitic swollen with complete destruction of the extensor mechanism.  There is necrotic bone changes of the proximal phalanx at the PIP joint.  Patient's hemoglobin A1c has ranged from 13 to greater than 15.5.  There is no sign of infection proximal to the MCP joint.Heart RRR Lungs  Clear Assessment/Plan 1. Osteomyelitis of finger of left hand ([place])     Plan: With complete destruction of the extensor mechan at the PIP joint and destructive osteomyelitis of the proximal phalanx patient will require an amputation of the little finger at the MCP joint.  Discussed the importance of improved glucose control and will plan for outpatient surgery on Wednesday.  Follow-Up Instructions: Return in about 1 week (around   [place], [place] 04/28/2020, 6:55 AM

## 2020-04-28 NOTE — Anesthesia Postprocedure Evaluation (Signed)
Anesthesia Post Note  Patient: Vincent [name]  Procedure(s) Performed: LEFT LITTLE FINGER AMPUTATION (Left Finger)     Patient location during evaluation: PACU Anesthesia Type: MAC Level of consciousness: awake and alert and oriented Pain management: pain level controlled Vital Signs Assessment: post-procedure vital signs reviewed and stable Respiratory status: spontaneous breathing, nonlabored ventilation and respiratory function stable Cardiovascular status: blood pressure returned to baseline Postop Assessment: no apparent nausea or vomiting Anesthetic complications: no   No complications documented.  Last Vitals:  Vitals:   04/28/20 1305 04/28/20 1320  BP: 130/72 124/78  Pulse: 65 72  Resp: 17 16  Temp:    SpO2: 98% 98%    Last Pain:  Vitals:   04/28/20 1248  TempSrc:   PainSc: 0-No pain                 Brennan Bailey

## 2020-04-28 NOTE — Anesthesia Procedure Notes (Signed)
Procedure Name: MAC Date/Time: 04/28/2020 12:27 PM Performed by: Wilburn Cornelia, CRNA Pre-anesthesia Checklist: Patient identified, Emergency Drugs available, Suction available, Patient being monitored and Timeout performed Patient Re-evaluated:Patient Re-evaluated prior to induction Oxygen Delivery Method: Simple face mask Induction Type: IV induction Placement Confirmation: positive ETCO2 and breath sounds checked- equal and bilateral Dental Injury: Teeth and Oropharynx as per pre-operative assessment

## 2020-04-28 NOTE — Anesthesia Preprocedure Evaluation (Signed)
Anesthesia Evaluation  Patient identified by MRN, date of birth, ID band Patient awake    Reviewed: Allergy & Precautions, NPO status , Patient's Chart, lab work & pertinent test results  History of Anesthesia Complications Negative for: history of anesthetic complications  Airway Mallampati: II  TM Distance: >3 FB Neck ROM: Full    Dental  (+) Edentulous Lower, Edentulous Upper   Pulmonary sleep apnea , COPD, Current Smoker,    Pulmonary exam normal        Cardiovascular hypertension, + CAD  Normal cardiovascular exam     Neuro/Psych Seizures -,  Anxiety Depression Bipolar Disorder TIA   GI/Hepatic Neg liver ROS, hiatal hernia, GERD  ,  Endo/Other  diabetes, Type 2, Insulin Dependent  Renal/GU negative Renal ROS  negative genitourinary   Musculoskeletal  (+) Arthritis , Rheumatoid disorders,    Abdominal   Peds  Hematology negative hematology ROS (+)   Anesthesia Other Findings Day of surgery medications reviewed with patient.  Reproductive/Obstetrics negative OB ROS                             Anesthesia Physical Anesthesia Plan  ASA: II  Anesthesia Plan: MAC   Post-op Pain Management:    Induction:   PONV Risk Score and Plan: 0 and Treatment may vary due to age or medical condition and Propofol infusion  Airway Management Planned: Natural Airway and Simple Face Mask  Additional Equipment: None  Intra-op Plan:   Post-operative Plan:   Informed Consent: I have reviewed the patients History and Physical, chart, labs and discussed the procedure including the risks, benefits and alternatives for the proposed anesthesia with the patient or authorized representative who has indicated his/her understanding and acceptance.       Plan Discussed with: CRNA  Anesthesia Plan Comments:         Anesthesia Quick Evaluation

## 2020-04-28 NOTE — Interval H&P Note (Signed)
History and Physical Interval Note:  04/28/2020 10:40 AM  Vincent [name]  has presented today for surgery, with the diagnosis of Osteomyelitis Left Little Finger.  The various methods of treatment have been discussed with the patient and family. After consideration of risks, benefits and other options for treatment, the patient has consented to  Procedure(s): LEFT LITTLE FINGER AMPUTATION (Left) as a surgical intervention.  The patient's history has been reviewed, patient examined, no change in status, stable for surgery.  I have reviewed the patient's chart and labs.  Questions were answered to the patient's satisfaction.     Newt Minion

## 2020-04-28 NOTE — Transfer of Care (Signed)
Immediate Anesthesia Transfer of Care Note  Patient: Vincent [name]  Procedure(s) Performed: LEFT LITTLE FINGER AMPUTATION (Left Finger)  Patient Location: PACU  Anesthesia Type:MAC combined with regional for post-op pain  Level of Consciousness: awake, alert  and oriented  Airway & Oxygen Therapy: Patient Spontanous Breathing  Post-op Assessment: Report given to RN, Post -op Vital signs reviewed and stable and Patient moving all extremities  Post vital signs: Reviewed and stable  Last Vitals:  Vitals Value Taken Time  BP 114/76 04/28/20 1248  Temp 36.1 C 04/28/20 1248  Pulse 65 04/28/20 1251  Resp 15 04/28/20 1251  SpO2 100 % 04/28/20 1251  Vitals shown include unvalidated device data.  Last Pain:  Vitals:   04/28/20 1006  TempSrc:   PainSc: 4       Patients Stated Pain Goal: 2 (33/35/45 6256)  Complications: No complications documented.

## 2020-04-29 ENCOUNTER — Encounter (HOSPITAL_COMMUNITY): Payer: Self-pay | Admitting: Orthopedic Surgery

## 2020-04-30 DIAGNOSIS — I96 Gangrene, not elsewhere classified: Secondary | ICD-10-CM

## 2020-04-30 NOTE — Op Note (Signed)
04/28/2020  6:47 AM  PATIENT:  Vincent [name]    PRE-OPERATIVE DIAGNOSIS:  Osteomyelitis Left Little Finger  POST-OPERATIVE DIAGNOSIS:  Same  PROCEDURE:  LEFT LITTLE FINGER AMPUTATION  SURGEON:  Newt Minion, MD  PHYSICIAN ASSISTANT:None ANESTHESIA:   General  PREOPERATIVE INDICATIONS:  Vincent [name] is a  55 y.o. male with a diagnosis of Osteomyelitis Left Little Finger who failed conservative measures and elected for surgical management.    The risks benefits and alternatives were discussed with the patient preoperatively including but not limited to the risks of infection, bleeding, nerve injury, cardiopulmonary complications, the need for revision surgery, among others, and the patient was willing to proceed.  OPERATIVE IMPLANTS: None  _0 @  OPERATIVE FINDINGS: Good petechial bleeding at the MCP joint no abscess  OPERATIVE PROCEDURE: Patient was brought the operating room underwent a MAC anesthetic.  The left upper extremity was then prepped using DuraPrep draped into a sterile field a timeout was called.  Patient underwent a digital block with 10 cc of 1% lidocaine plain.  A fishmouth incision was made just distal to the MCP joint the little finger was amputated through the MCP joint electrocautery was used hemostasis wound was irrigated with normal saline and incision was closed using 2-0 nylon a sterile dressing was applied patient was taken the PACU in stable condition   DISCHARGE PLANNING:  Antibiotic duration: Preoperative antibiotics  Weightbearing: Not applicable  Pain medication: Percocet  Dressing care/ Wound VAC: Follow-up in the office 1 week to change the dressing  Ambulatory devices: Not applicable  Discharge to: Home.  Follow-up: In the office 1 week post operative.

## 2020-05-03 ENCOUNTER — Ambulatory Visit (INDEPENDENT_AMBULATORY_CARE_PROVIDER_SITE_OTHER): Payer: No Typology Code available for payment source | Admitting: Orthopedic Surgery

## 2020-05-03 DIAGNOSIS — Z89022 Acquired absence of left finger(s): Secondary | ICD-10-CM

## 2020-05-04 ENCOUNTER — Encounter: Payer: Self-pay | Admitting: Orthopedic Surgery

## 2020-05-04 ENCOUNTER — Ambulatory Visit: Payer: Medicare Other | Admitting: Orthotics

## 2020-05-04 ENCOUNTER — Ambulatory Visit: Payer: Medicare Other | Admitting: Podiatry

## 2020-05-04 NOTE — Progress Notes (Signed)
Office Visit Note   Patient: Vincent [name]           Date of Birth: Aug 29, 1965           MRN: [id number] Visit Date: 05/03/2020              Requested by: Hayden Rasmussen, MD [address],  [place] [address] PCP: Hayden Rasmussen, MD  Chief Complaint  Patient presents with  . Left Hand - Routine Post Op    04/28/20 left little finger amputation       HPI: Plan patient is a 55 year old gentleman who is 1 week status post left little finger amputation.  Patient states he feels well has no complaints is pleased with the way his hand looks.  Assessment & Plan: Visit Diagnoses:  1. H/O surgical amputation of finger, left     Plan: Wash hands with soap and water apply Band-Aid daily follow-up in 1 week to remove the sutures.  Follow-Up Instructions: Return in about 1 week (around 05/10/2020).   Ortho Exam  Patient is alert, oriented, no adenopathy, well-dressed, normal affect, normal respiratory effort. Examination the surgical incision is well approximated there is no cellulitis no odor no drainage no signs of infection.  Imaging: No results found. No images are attached to the encounter.  Labs: Lab Results  Component Value Date   HGBA1C 13.0 (A) 01/22/2020   HGBA1C >15.5 (H) 01/08/2020   HGBA1C  01/05/2020     Comment:     To high to result   ESRSEDRATE 2 04/24/2019   CRP 1 04/24/2019   REPTSTATUS 03/06/2014 FINAL 03/04/2014   CULT NO GROWTH Performed at [facility]  03/04/2014     Lab Results  Component Value Date   ALBUMIN 4.5 06/05/2019   ALBUMIN 4.2 10/19/2018   ALBUMIN 3.9 05/17/2017    Lab Results  Component Value Date   MG 1.6 (L) 08/09/2019   MG 2.2 01/03/2018   Lab Results  Component Value Date   VD25OH 20.4 (L) 04/24/2019   VD25OH 22.5 (L) 11/22/2016    No results found for: PREALBUMIN CBC EXTENDED Latest Ref Rng & Units 04/28/2020 03/29/2020 03/29/2020  WBC 4.0 - 10.5 K/uL 8.1 - -  RBC 4.22 - 5.81 MIL/uL  4.73 - -  HGB 13.0 - 17.0 g/dL 13.3 11.9(L) 5.4(LL)  HCT 39.0 - 52.0 % 39.0 35.0(L) 16.0(L)  PLT 150 - 400 K/uL 311 - -  NEUTROABS 1.7 - 7.7 K/uL - - -  LYMPHSABS 0.7 - 4.0 K/uL - - -     There is no height or weight on file to calculate BMI.  Orders:  No orders of the defined types were placed in this encounter.  No orders of the defined types were placed in this encounter.    Procedures: No procedures performed  Clinical Data: No additional findings.  ROS:  All other systems negative, except as noted in the HPI. Review of Systems  Objective: Vital Signs: There were no vitals taken for this visit.  Specialty Comments:  No specialty comments available.  PMFS History: Patient Active Problem List   Diagnosis Date Noted  . Gangrene of finger of left hand ([place])   . Pain due to onychomycosis of toenails of both feet 01/28/2020  . Hyperglycemia due to type 2 diabetes mellitus (Vincent [name]) 01/07/2020  . Resistance to insulin 01/07/2020  . Right leg weakness 04/24/2019  . Right leg pain 04/24/2019  . Gait abnormality  04/24/2019  . TIA (transient ischemic attack) 10/20/2018  . Uncontrolled type 2 DM with hyperosmolar nonketotic hyperglycemia (Vincent [name]) 10/19/2018  . Left sided numbness 10/19/2018  . Diabetic autonomic neuropathy associated with type 2 diabetes mellitus (Vincent [name]) 02/01/2018  . DM type 2 with diabetic peripheral neuropathy (Vincent [name]) 02/01/2018  . Dyslipidemia 01/31/2018  . Coronary artery disease involving native coronary artery of native heart without angina pectoris 01/31/2018  . Chest pain 01/01/2018  . Seizure disorder ([place]) 12/19/2016  . Essential tremor 12/19/2016  . Polypharmacy 12/19/2016  . Chronic post-traumatic stress disorder (PTSD) 06/16/2016  . Tremor 06/16/2016  . Tobacco use disorder 05/05/2016  . Psychophysiological insomnia 06/02/2015  . Altered mental status 05/28/2015  . Acute bronchitis 05/28/2015  . Type 2 diabetes mellitus with hyperglycemia (Vincent [name])  05/28/2015  . Hypothyroidism 05/28/2015  . Bipolar 2 disorder ([place]) 05/28/2015  . History of rheumatoid arthritis 05/28/2015  . Chronic pain 05/28/2015  . Depression 03/18/2015  . Carpal tunnel syndrome on left 02/10/2015  . Carpal tunnel syndrome on right 02/10/2015  . Diabetes ([place]) 01/23/2015  . Obesity (BMI 30-39.9) 03/06/2014  . Acute encephalopathy 03/05/2014  . HLD (hyperlipidemia) 03/05/2014  . Anemia, normocytic normochromic 03/05/2014  . Altered mental state   . Helicobacter pylori (H. pylori) infection 11/20/2012  . Protein-calorie malnutrition, severe (Vincent [name]) 10/09/2012  . Dysphagia 08/19/2012  . Gastroparesis 08/19/2012  . Bladder tumor 08/08/2012  . Biliary dyskinesia 11/02/2010   Past Medical History:  Diagnosis Date  . Anxiety   . Benign essential tremor   . Bipolar 2 disorder (Vincent [name])    followed by [facility]--- dr s. Adele Schilder  . Bladder cancer (HCC)    recurrent  . CAD (coronary artery disease)    cardiac cath 2003  and 2011 both showed normal coronary arteries w/ preserved lvf;  Non obstructive on CTA Oct 2019.   Marland Kitchen Chronic pain syndrome    back---- [facility] [place]  . Cold extremities    BLE  . COPD (chronic obstructive pulmonary disease) (Vincent [name])   . DDD (degenerative disc disease), lumbar   . Diabetic peripheral neuropathy (Vincent [name])   . Gastroparesis    followed by dr Henrene Pastor  . GERD (gastroesophageal reflux disease)   . Hiatal hernia   . History of bladder cancer urologist-  previously dr Consuella Lose;  now dr gay   papillay TCC (Ta G1)  s/p TURBT and chemo instillation 2014  . History of chest pain 12/2017   heart cath normal  . History of encephalopathy 05/27/2015   admission w/ acute encephalopathy thought to be secondary to pain meds and COPD  . History of gastric ulcer   . History of Helicobacter pylori infection   . History of kidney stones   . History of TIA (transient ischemic attack) 2008  and 10-19-2018    no residual's  . History of  traumatic head injury 2010   w/ LOC  per pt needed stitches, hit in head with a mower blade  . Hyperlipidemia   . Hypertension   . Hypogonadism male    s/p  bilateral orchiectomy  . Hypothyroidism   . Insomnia   . Mild obstructive sleep apnea    study in epic 12-04-2016, no cpap  . PTSD (post-traumatic stress disorder)    chronic  . PTSD (post-traumatic stress disorder)   . RA (rheumatoid arthritis) ([place])    follow[facility] by [facility]  . Seizures, transient [facility]) neurologist-  dr Krista Blue--  differential dx complex partial seizure .  vs.  mood disorder .vs.  pseudoseizure--  negative EEG's   confusion episodes and staring spells since 11/ 2015   (03-26-2020 per pt wife last seizure 10 /2021)  . Transient confusion NEUOROLOGIST-  DR YAN   Episodes since 11/ 2015--  neurologist dx  differential complex partial seizure  .vs. mood disorder . vs. pseudoseizure  . Type 2 diabetes mellitus treated with insulin [facility])    endocrinologist--- dr Loanne Drilling---  (03-26-2020 pt does not check blood sugar at home)    Family History  Problem Relation Age of Onset  . Diabetes Mother   . Diabetes Father   . Hypertension Father   . Heart attack Father 71       died age 41  . Alcohol abuse Father   . Colon cancer Neg Hx   . Esophageal cancer Neg Hx   . Stomach cancer Neg Hx   . Rectal cancer Neg Hx     Past Surgical History:  Procedure Laterality Date  . AMPUTATION Left 04/28/2020   Procedure: LEFT LITTLE FINGER AMPUTATION;  Surgeon: Newt Minion, MD;  Location: [facility] [place];  Service: Orthopedics;  Laterality: Left;  . CARDIAC CATHETERIZATION  12-27-2001  DR Einar Gip  &  05-26-2009  DR Irish Lack   RESULTS FOR BOTH ARE NORMAL CORONARIES AND PERSERVED LVF/ EF 60%  . CARPAL TUNNEL RELEASE Bilateral right 09-16-2003;  left ?  . CARPAL TUNNEL RELEASE Left 02/25/2015   Procedure: LEFT CARPAL TUNNEL RELEASE;  Surgeon: Leanora Cover, MD;  Location: [place];  Service: Orthopedics;  Laterality:  Left;  . CYSTOSCOPY N/A 10/10/2012   Procedure: CYSTOSCOPY CLOT EVACUATION FULGERATION OF BLEEDERS ;  Surgeon: Claybon Jabs, MD;  Location[facility];  Service: Urology;  Laterality: N/A;  . CYSTOSCOPY WITH BIOPSY N/A 11/26/2015   Procedure: CYSTOSCOPY WITH BIOPSY AND FULGURATION;  Surgeon: Kathie Rhodes, MD;  Location: [place];  Service: Urology;  Laterality: N/A;  . ESOPHAGOGASTRODUODENOSCOPY  2014  . LAPAROSCOPIC CHOLECYSTECTOMY  11-17-2010  . ORCHIECTOMY Right 02/21/2016   Procedure: SCROTAL ORCHIECTOMY with TESTICULAR PROSTHESIS IMPLANT;  Surgeon: Kathie Rhodes, MD;  Location: [facility];  Service: Urology;  Laterality: Right;  . ORCHIECTOMY Left 09/02/2018   Procedure: ORCHIECTOMY;  Surgeon: Kathie Rhodes, MD;  Location: [facility];  Service: Urology;  Laterality: Left;  . ROTATOR CUFF REPAIR Right 12/2004  . TRANSURETHRAL RESECTION OF BLADDER TUMOR N/A 08/09/2012   Procedure: TRANSURETHRAL RESECTION OF BLADDER TUMOR (TURBT) WITH GYRUS WITH MITOMYCIN C;  Surgeon: Claybon Jabs, MD;  Location: [facility];  Service: Urology;  Laterality: N/A;  . TRANSURETHRAL RESECTION OF BLADDER TUMOR N/A 03/29/2020   Procedure: TRANSURETHRAL RESECTION OF BLADDER TUMOR (TURBT) and post-op instillation of gemcitabine;  Surgeon: Janith Lima, MD;  Location: [facility];  Service: Urology;  Laterality: N/A;  . TRANSURETHRAL RESECTION OF BLADDER TUMOR WITH GYRUS (TURBT-GYRUS) N/A 02/27/2014   Procedure: TRANSURETHRAL RESECTION OF BLADDER TUMOR WITH GYRUS (TURBT-GYRUS);  Surgeon: Claybon Jabs, MD;  Location: [facility];  Service: Urology;  Laterality: N/A;   Social History   Occupational History  . Occupation: Engineer, technical sales    Comment: Owner of company  Tobacco Use  . Smoking status: Current Every Day Smoker    Packs/day: 1.50    Years: 38.00    Pack years: 57.00    Types:  Cigarettes  . Smokeless tobacco: Never Used  Vaping Use  . Vaping Use:  Never used  Substance and Sexual Activity  . Alcohol use: No  . Drug use: No  . Sexual activity: Not Currently    Partners: Female    Birth control/protection: None

## 2020-05-10 ENCOUNTER — Ambulatory Visit (INDEPENDENT_AMBULATORY_CARE_PROVIDER_SITE_OTHER): Payer: No Typology Code available for payment source | Admitting: Physician Assistant

## 2020-05-10 ENCOUNTER — Encounter: Payer: Self-pay | Admitting: Orthopedic Surgery

## 2020-05-10 DIAGNOSIS — M869 Osteomyelitis, unspecified: Secondary | ICD-10-CM

## 2020-05-10 NOTE — Progress Notes (Signed)
Office Visit Note   Patient: Vincent [name]           Date of Birth: 1965/06/03           MRN: [id number] Visit Date: 05/10/2020              Requested by: Hayden Rasmussen, MD [address],  [place] [address] PCP: Hayden Rasmussen, MD  Chief Complaint  Patient presents with  . Left Hand - Routine Post Op    04/28/20 left little finger amputation       HPI: Patient is 13 days status post left little finger amputation.  He overall is doing well.  He continues to smoke.  He says he is trying to keep his diabetes under fair control  Assessment & Plan: Visit Diagnoses: No diagnosis found.  Plan: Sutures will be removed next week.  If he is doing well I think he may follow-up as needed after that.  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   Ortho Exam  Patient is alert, oriented, no adenopathy, well-dressed, normal affect, normal respiratory effort. Examination well apposed wound edges in the final stages of healing.  No drainage no surrounding cellulitis swelling is well controlled  Imaging: No results found. No images are attached to the encounter.  Labs: Lab Results  Component Value Date   HGBA1C 13.0 (A) 01/22/2020   HGBA1C >15.5 (H) 01/08/2020   HGBA1C  01/05/2020     Comment:     To high to result   ESRSEDRATE 2 04/24/2019   CRP 1 04/24/2019   REPTSTATUS 03/06/2014 FINAL 03/04/2014   CULT NO GROWTH Performed at [facility]  03/04/2014     Lab Results  Component Value Date   ALBUMIN 4.5 06/05/2019   ALBUMIN 4.2 10/19/2018   ALBUMIN 3.9 05/17/2017    Lab Results  Component Value Date   MG 1.6 (L) 08/09/2019   MG 2.2 01/03/2018   Lab Results  Component Value Date   VD25OH 20.4 (L) 04/24/2019   VD25OH 22.5 (L) 11/22/2016    No results found for: PREALBUMIN CBC EXTENDED Latest Ref Rng  & Units 04/28/2020 03/29/2020 03/29/2020  WBC 4.0 - 10.5 K/uL 8.1 - -  RBC 4.22 - 5.81 MIL/uL 4.73 - -  HGB 13.0 - 17.0 g/dL 13.3 11.9(L) 5.4(LL)  HCT 39.0 - 52.0 % 39.0 35.0(L) 16.0(L)  PLT 150 - 400 K/uL 311 - -  NEUTROABS 1.7 - 7.7 K/uL - - -  LYMPHSABS 0.7 - 4.0 K/uL - - -     There is no height or weight on file to calculate BMI.  Orders:  No orders of the defined types were placed in this encounter.  No orders of the defined types were placed in this encounter.    Procedures: No procedures performed  Clinical Data: No additional findings.  ROS:  All other systems negative, except as noted in the HPI. Review of Systems  Objective: Vital Signs: There were no vitals taken for this visit.  Specialty Comments:  No specialty comments available.  PMFS History: Patient Active Problem List   Diagnosis Date Noted  . Gangrene of finger of left hand ([place])   . Pain due to onychomycosis of toenails of both feet 01/28/2020  . Hyperglycemia due to type 2 diabetes mellitus ([place])  01/07/2020  . Resistance to insulin 01/07/2020  . Right leg weakness 04/24/2019  . Right leg pain 04/24/2019  . Gait abnormality 04/24/2019  . TIA (transient ischemic attack) 10/20/2018  . Uncontrolled type 2 DM with hyperosmolar nonketotic hyperglycemia ([place]) 10/19/2018  . Left sided numbness 10/19/2018  . Diabetic autonomic neuropathy associated with type 2 diabetes mellitus ([place]) 02/01/2018  . DM type 2 with diabetic peripheral neuropathy ([place]) 02/01/2018  . Dyslipidemia 01/31/2018  . Coronary artery disease involving native coronary artery of native heart without angina pectoris 01/31/2018  . Chest pain 01/01/2018  . Seizure disorder ([place]) 12/19/2016  . Essential tremor 12/19/2016  . Polypharmacy 12/19/2016  . Chronic post-traumatic stress disorder (PTSD) 06/16/2016  . Tremor 06/16/2016  . Tobacco use disorder 05/05/2016  . Psychophysiological insomnia 06/02/2015  . Altered mental status  05/28/2015  . Acute bronchitis 05/28/2015  . Type 2 diabetes mellitus with hyperglycemia ([place]) 05/28/2015  . Hypothyroidism 05/28/2015  . Bipolar 2 disorder ([place]) 05/28/2015  . History of rheumatoid arthritis 05/28/2015  . Chronic pain 05/28/2015  . Depression 03/18/2015  . Carpal tunnel syndrome on left 02/10/2015  . Carpal tunnel syndrome on right 02/10/2015  . Diabetes ([place]) 01/23/2015  . Obesity (BMI 30-39.9) 03/06/2014  . Acute encephalopathy 03/05/2014  . HLD (hyperlipidemia) 03/05/2014  . Anemia, normocytic normochromic 03/05/2014  . Altered mental state   . Helicobacter pylori (H. pylori) infection 11/20/2012  . Protein-calorie malnutrition, severe ([place]) 10/09/2012  . Dysphagia 08/19/2012  . Gastroparesis 08/19/2012  . Bladder tumor 08/08/2012  . Biliary dyskinesia 11/02/2010   Past Medical History:  Diagnosis Date  . Anxiety   . Benign essential tremor   . Bipolar 2 disorder ([place])    followed by [facility]--- dr s. Adele Schilder  . Bladder cancer (HCC)    recurrent  . CAD (coronary artery disease)    cardiac cath 2003  and 2011 both showed normal coronary arteries w/ preserved lvf;  Non obstructive on CTA Oct 2019.   Marland Kitchen Chronic pain syndrome    back---- [facility] [place]  . Cold extremities    BLE  . COPD (chronic obstructive pulmonary disease) ([place])   . DDD (degenerative disc disease), lumbar   . Diabetic peripheral neuropathy ([place])   . Gastroparesis    followed by dr Henrene Pastor  . GERD (gastroesophageal reflux disease)   . Hiatal hernia   . History of bladder cancer urologist-  previously dr Consuella Lose;  now dr gay   papillay TCC (Ta G1)  s/p TURBT and chemo instillation 2014  . History of chest pain 12/2017   heart cath normal  . History of encephalopathy 05/27/2015   admission w/ acute encephalopathy thought to be secondary to pain meds and COPD  . History of gastric ulcer   . History of Helicobacter pylori infection   . History of kidney stones   .  History of TIA (transient ischemic attack) 2008  and 10-19-2018    no residual's  . History of traumatic head injury 2010   w/ LOC  per pt needed stitches, hit in head with a mower blade  . Hyperlipidemia   . Hypertension   . Hypogonadism male    s/p  bilateral orchiectomy  . Hypothyroidism   . Insomnia   . Mild obstructive sleep apnea    study in epic 12-04-2016, no cpap  . PTSD (post-traumatic stress disorder)    chronic  . PTSD (post-traumatic stress disorder)   . RA (rheumatoid arthritis) ([place])  follow[facility] by [facility]  . Seizures, transient [facility]) neurologist-  dr Krista Blue--  differential dx complex partial seizure .vs.  mood disorder .vs.  pseudoseizure--  negative EEG's   confusion episodes and staring spells since 11/ 2015   (03-26-2020 per pt wife last seizure 10 /2021)  . Transient confusion NEUOROLOGIST-  DR YAN   Episodes since 11/ 2015--  neurologist dx  differential complex partial seizure  .vs. mood disorder . vs. pseudoseizure  . Type 2 diabetes mellitus treated with insulin [facility])    endocrinologist--- dr Loanne Drilling---  (03-26-2020 pt does not check blood sugar at home)    Family History  Problem Relation Age of Onset  . Diabetes Mother   . Diabetes Father   . Hypertension Father   . Heart attack Father 72       died age 22  . Alcohol abuse Father   . Colon cancer Neg Hx   . Esophageal cancer Neg Hx   . Stomach cancer Neg Hx   . Rectal cancer Neg Hx     Past Surgical History:  Procedure Laterality Date  . AMPUTATION Left 04/28/2020   Procedure: LEFT LITTLE FINGER AMPUTATION;  Surgeon: Newt Minion, MD;  Location: [place];  Service: Orthopedics;  Laterality: Left;  . CARDIAC CATHETERIZATION  12-27-2001  DR Einar Gip  &  05-26-2009  DR Irish Lack   RESULTS FOR BOTH ARE NORMAL CORONARIES AND PERSERVED LVF/ EF 60%  . CARPAL TUNNEL RELEASE Bilateral right 09-16-2003;  left ?  . CARPAL TUNNEL RELEASE Left 02/25/2015   Procedure: LEFT CARPAL TUNNEL RELEASE;   Surgeon: Leanora Cover, MD;  Location: [place];  Service: Orthopedics;  Laterality: Left;  . CYSTOSCOPY N/A 10/10/2012   Procedure: CYSTOSCOPY CLOT EVACUATION FULGERATION OF BLEEDERS ;  Surgeon: Claybon Jabs, MD;  Location: [facility];  Service: Urology;  Laterality: N/A;  . CYSTOSCOPY WITH BIOPSY N/A 11/26/2015   Procedure: CYSTOSCOPY WITH BIOPSY AND FULGURATION;  Surgeon: Kathie Rhodes, MD;  Location: [place];  Service: Urology;  Laterality: N/A;  . ESOPHAGOGASTRODUODENOSCOPY  2014  . LAPAROSCOPIC CHOLECYSTECTOMY  11-17-2010  . ORCHIECTOMY Right 02/21/2016   Procedure: SCROTAL ORCHIECTOMY with TESTICULAR PROSTHESIS IMPLANT;  Surgeon: Kathie Rhodes, MD;  Location: [facility];  Service: Urology;  Laterality: Right;  . ORCHIECTOMY Left 09/02/2018   Procedure: ORCHIECTOMY;  Surgeon: Kathie Rhodes, MD;  Location: [facility];  Service: Urology;  Laterality: Left;  . ROTATOR CUFF REPAIR Right 12/2004  . TRANSURETHRAL RESECTION OF BLADDER TUMOR N/A 08/09/2012   Procedure: TRANSURETHRAL RESECTION OF BLADDER TUMOR (TURBT) WITH GYRUS WITH MITOMYCIN C;  Surgeon: Claybon Jabs, MD;  Location: [facility];  Service: Urology;  Laterality: N/A;  . TRANSURETHRAL RESECTION OF BLADDER TUMOR N/A 03/29/2020   Procedure: TRANSURETHRAL RESECTION OF BLADDER TUMOR (TURBT) and post-op instillation of gemcitabine;  Surgeon: Janith Lima, MD;  Location: [facility];  Service: Urology;  Laterality: N/A;  . TRANSURETHRAL RESECTION OF BLADDER TUMOR WITH GYRUS (TURBT-GYRUS) N/A 02/27/2014   Procedure: TRANSURETHRAL RESECTION OF BLADDER TUMOR WITH GYRUS (TURBT-GYRUS);  Surgeon: Claybon Jabs, MD;  Location: [facility];  Service: Urology;  Laterality: N/A;   Social History   Occupational History  . Occupation: Engineer, technical sales    Comment: Owner of company  Tobacco Use  . Smoking status:  Current Every Day Smoker    Packs/day: 1.50    Years: 38.00    Pack  years: 57.00    Types: Cigarettes  . Smokeless tobacco: Never Used  Vaping Use  . Vaping Use: Never used  Substance and Sexual Activity  . Alcohol use: No  . Drug use: No  . Sexual activity: Not Currently    Partners: Female    Birth control/protection: None

## 2020-05-17 ENCOUNTER — Ambulatory Visit (INDEPENDENT_AMBULATORY_CARE_PROVIDER_SITE_OTHER): Payer: No Typology Code available for payment source | Admitting: Physician Assistant

## 2020-05-17 ENCOUNTER — Encounter: Payer: Self-pay | Admitting: Orthopedic Surgery

## 2020-05-17 DIAGNOSIS — M869 Osteomyelitis, unspecified: Secondary | ICD-10-CM

## 2020-05-17 MED ORDER — DOXYCYCLINE HYCLATE 100 MG PO TABS: 100.0000 mg | ORAL_TABLET | Freq: Two times a day (BID) | ORAL | 0 refills | Status: DC

## 2020-05-17 NOTE — Progress Notes (Signed)
Office Visit Note   Patient: Vincent [name]           Date of Birth: Aug 08, 1965           MRN: [id number] Visit Date: 05/17/2020              Requested by: Hayden Rasmussen, MD [address] [place],  [place] [address] PCP: Hayden Rasmussen, MD  Chief Complaint  Patient presents with   Left Hand - Routine Post Op    04/28/20 left little finger amputation       HPI: Patient is a pleasant 55 year old gentleman who is now 3 weeks status post left little finger amputation.  He had 1 remaining suture.  He states he did have to go back to work and he had scraped up his amputation site just to but he was concerned that he had injured a  Assessment & Plan: Visit Diagnoses: No diagnosis found.  Plan: Everything appears healed he just had a spot of purulent drainage.  Given his history.  I will place him on 1 week of oral antibiotics.  If this does not resolve his he is to follow-up  Follow-Up Instructions: No follow-ups on file.   Ortho Exam  Patient is alert, oriented, no adenopathy, well-dressed, normal affect, normal respiratory effort. Focused examination demonstrates amputation stump has overall healed there is no cellulitis there is no swelling.  I did take out the remaining suture.  He had a spot of purulent drainage.  No tenderness.  No ascending cellulitis.  No fluctuance.  Imaging: No results found. No images are attached to the encounter.  Labs: Lab Results  Component Value Date   HGBA1C 13.0 (A) 01/22/2020   HGBA1C >15.5 (H) 01/08/2020   HGBA1C  01/05/2020     Comment:     To high to result   ESRSEDRATE 2 04/24/2019   CRP 1 04/24/2019   REPTSTATUS 03/06/2014 FINAL 03/04/2014   CULT NO GROWTH Performed at [facility]  03/04/2014     Lab Results  Component Value Date   ALBUMIN 4.5 06/05/2019   ALBUMIN 4.2 10/19/2018   ALBUMIN 3.9 05/17/2017    Lab Results  Component Value Date   MG 1.6 (L) 08/09/2019   MG 2.2 01/03/2018   Lab  Results  Component Value Date   VD25OH 20.4 (L) 04/24/2019   VD25OH 22.5 (L) 11/22/2016    No results found for: PREALBUMIN CBC EXTENDED Latest Ref Rng & Units 04/28/2020 03/29/2020 03/29/2020  WBC 4.0 - 10.5 K/uL 8.1 - -  RBC 4.22 - 5.81 MIL/uL 4.73 - -  HGB 13.0 - 17.0 g/dL 13.3 11.9(L) 5.4(LL)  HCT 39.0 - 52.0 % 39.0 35.0(L) 16.0(L)  PLT 150 - 400 K/uL 311 - -  NEUTROABS 1.7 - 7.7 K/uL - - -  LYMPHSABS 0.7 - 4.0 K/uL - - -     There is no height or weight on file to calculate BMI.  Orders:  No orders of the defined types were placed in this encounter.  No orders of the defined types were placed in this encounter.    Procedures: No procedures performed  Clinical Data: No additional findings.  ROS:  All other systems negative, except as noted in the HPI. Review of Systems  Objective: Vital Signs: There were no vitals taken for this visit.  Specialty Comments:  No specialty comments available.  PMFS History: Patient Active Problem List   Diagnosis Date Noted   Gangrene  of finger of left hand (HCC)    Pain due to onychomycosis of toenails of both feet 01/28/2020   Hyperglycemia due to type 2 diabetes mellitus ([place]) 01/07/2020   Resistance to insulin 01/07/2020   Right leg weakness 04/24/2019   Right leg pain 04/24/2019   Gait abnormality 04/24/2019   TIA (transient ischemic attack) 10/20/2018   Uncontrolled type 2 DM with hyperosmolar nonketotic hyperglycemia ([place]) 10/19/2018   Left sided numbness 10/19/2018   Diabetic autonomic neuropathy associated with type 2 diabetes mellitus ([place]) 02/01/2018   DM type 2 with diabetic peripheral neuropathy ([place]) 02/01/2018   Dyslipidemia 01/31/2018   Coronary artery disease involving native coronary artery of native heart without angina pectoris 01/31/2018   Chest pain 01/01/2018   Seizure disorder ([place]) 12/19/2016   Essential tremor 12/19/2016   Polypharmacy 12/19/2016   Chronic post-traumatic stress  disorder (PTSD) 06/16/2016   Tremor 06/16/2016   Tobacco use disorder 05/05/2016   Psychophysiological insomnia 06/02/2015   Altered mental status 05/28/2015   Acute bronchitis 05/28/2015   Type 2 diabetes mellitus with hyperglycemia ([place]) 05/28/2015   Hypothyroidism 05/28/2015   Bipolar 2 disorder ([place]) 05/28/2015   History of rheumatoid arthritis 05/28/2015   Chronic pain 05/28/2015   Depression 03/18/2015   Carpal tunnel syndrome on left 02/10/2015   Carpal tunnel syndrome on right 02/10/2015   Diabetes ([place]) 01/23/2015   Obesity (BMI 30-39.9) 03/06/2014   Acute encephalopathy 03/05/2014   HLD (hyperlipidemia) 03/05/2014   Anemia, normocytic normochromic 03/05/2014   Altered mental state    Helicobacter pylori (H. pylori) infection 11/20/2012   Protein-calorie malnutrition, severe ([place]) 10/09/2012   Dysphagia 08/19/2012   Gastroparesis 08/19/2012   Bladder tumor 08/08/2012   Biliary dyskinesia 11/02/2010   Past Medical History:  Diagnosis Date   Anxiety    Benign essential tremor    Bipolar 2 disorder ([place])    followed by [facility]--- dr s. Adele Schilder   Bladder cancer [facility])    recurrent   CAD (coronary artery disease)    cardiac cath 2003  and 2011 both showed normal coronary arteries w/ preserved lvf;  Non obstructive on CTA Oct 2019.    Chronic pain syndrome    back---- [facility] [place]   Cold extremities    BLE   COPD (chronic obstructive pulmonary disease) ([place])    DDD (degenerative disc disease), lumbar    Diabetic peripheral neuropathy ([place])    Gastroparesis    followed by dr Henrene Pastor   GERD (gastroesophageal reflux disease)    Hiatal hernia    History of bladder cancer urologist-  previously dr Consuella Lose;  now dr gay   papillay TCC (Ta G1)  s/p TURBT and chemo instillation 2014   History of chest pain 12/2017   heart cath normal   History of encephalopathy 05/27/2015   admission w/ acute encephalopathy  thought to be secondary to pain meds and COPD   History of gastric ulcer    History of Helicobacter pylori infection    History of kidney stones    History of TIA (transient ischemic attack) 2008  and 10-19-2018    no residual's   History of traumatic head injury 2010   w/ LOC  per pt needed stitches, hit in head with a mower blade   Hyperlipidemia    Hypertension    Hypogonadism male    s/p  bilateral orchiectomy   Hypothyroidism    Insomnia    Mild obstructive sleep apnea  study in epic 12-04-2016, no cpap   PTSD (post-traumatic stress disorder)    chronic   PTSD (post-traumatic stress disorder)    RA (rheumatoid arthritis) ([place])    followed by [facility]   Seizures, transient [facility]) neurologist-  dr Krista Blue--  differential dx complex partial seizure .vs.  mood disorder .vs.  pseudoseizure--  negative EEG's   confusion episodes and staring spells since 11/ 2015   (03-26-2020 per pt wife last seizure 10 /2021)   Transient confusion NEUOROLOGIST-  DR Krista Blue   Episodes since 11/ 2015--  neurologist dx  differential complex partial seizure  .vs. mood disorder . vs. pseudoseizure   Type 2 diabetes mellitus treated with insulin [facility])    endocrinologist--- dr Loanne Drilling---  (03-26-2020 pt does not check blood sugar at home)    Family History  Problem Relation Age of Onset   Diabetes Mother    Diabetes Father    Hypertension Father    Heart attack Father 23       died age 53   Alcohol abuse Father    Colon cancer Neg Hx    Esophageal cancer Neg Hx    Stomach cancer Neg Hx    Rectal cancer Neg Hx     Past Surgical History:  Procedure Laterality Date   AMPUTATION Left 04/28/2020   Procedure: LEFT LITTLE FINGER AMPUTATION;  Surgeon: Newt Minion, MD;  Location: [place];  Service: Orthopedics;  Laterality: Left;   CARDIAC CATHETERIZATION  12-27-2001  DR Einar Gip  &  05-26-2009  DR Irish Lack   RESULTS FOR BOTH ARE NORMAL CORONARIES AND PERSERVED LVF/ EF  60%   CARPAL TUNNEL RELEASE Bilateral right 09-16-2003;  left ?   CARPAL TUNNEL RELEASE Left 02/25/2015   Procedure: LEFT CARPAL TUNNEL RELEASE;  Surgeon: Leanora Cover, MD;  Location: [place];  Service: Orthopedics;  Laterality: Left;   CYSTOSCOPY N/A 10/10/2012   Procedure: CYSTOSCOPY CLOT EVACUATION FULGERATION OF BLEEDERS ;  Surgeon: Claybon Jabs, MD;  Location: [facility];  Service: Urology;  Laterality: N/A;   CYSTOSCOPY WITH BIOPSY N/A 11/26/2015   Procedure: CYSTOSCOPY WITH BIOPSY AND FULGURATION;  Surgeon: Kathie Rhodes, MD;  Location: [place];  Service: Urology;  Laterality: N/A;   ESOPHAGOGASTRODUODENOSCOPY  2014   LAPAROSCOPIC CHOLECYSTECTOMY  11-17-2010   ORCHIECTOMY Right 02/21/2016   Procedure: SCROTAL ORCHIECTOMY with TESTICULAR PROSTHESIS IMPLANT;  Surgeon: Kathie Rhodes, MD;  Location[facility];  Service: Urology;  Laterality: Right;   ORCHIECTOMY Left 09/02/2018   Procedure: ORCHIECTOMY;  Surgeon: Kathie Rhodes, MD;  Location: [facility];  Service: Urology;  Laterality: Left;   ROTATOR CUFF REPAIR Right 12/2004   TRANSURETHRAL RESECTION OF BLADDER TUMOR N/A 08/09/2012   Procedure: TRANSURETHRAL RESECTION OF BLADDER TUMOR (TURBT) WITH GYRUS WITH MITOMYCIN C;  Surgeon: Claybon Jabs, MD;  Location[facility];  Service: Urology;  Laterality: N/A;   TRANSURETHRAL RESECTION OF BLADDER TUMOR N/A 03/29/2020   Procedure: TRANSURETHRAL RESECTION OF BLADDER TUMOR (TURBT) and post-op instillation of gemcitabine;  Surgeon: Janith Lima, MD;  Location: [facility];  Service: Urology;  Laterality: N/A;   TRANSURETHRAL RESECTION OF BLADDER TUMOR WITH GYRUS (TURBT-GYRUS) N/A 02/27/2014   Procedure: TRANSURETHRAL RESECTION OF BLADDER TUMOR WITH GYRUS (TURBT-GYRUS);  Surgeon: Claybon Jabs, MD;  Location: [facility];  Service: Urology;  Laterality: N/A;    Social History   Occupational History   Occupation: Engineer, technical sales  Comment: Owner of company  Tobacco Use   Smoking status: Current Every Day Smoker    Packs/day: 1.50    Years: 38.00    Pack years: 57.00    Types: Cigarettes   Smokeless tobacco: Never Used  Scientific laboratory technician Use: Never used  Substance and Sexual Activity   Alcohol use: No   Drug use: No   Sexual activity: Not Currently    Partners: Female    Birth control/protection: None

## 2020-05-25 DIAGNOSIS — D414 Neoplasm of uncertain behavior of bladder: Secondary | ICD-10-CM | POA: Diagnosis not present

## 2020-05-25 DIAGNOSIS — Z5111 Encounter for antineoplastic chemotherapy: Secondary | ICD-10-CM | POA: Diagnosis not present

## 2020-05-26 ENCOUNTER — Other Ambulatory Visit: Payer: Self-pay

## 2020-05-26 ENCOUNTER — Telehealth (HOSPITAL_COMMUNITY): Payer: No Typology Code available for payment source | Admitting: Psychiatry

## 2020-05-31 ENCOUNTER — Other Ambulatory Visit: Payer: Self-pay | Admitting: Physician Assistant

## 2020-05-31 DIAGNOSIS — R7989 Other specified abnormal findings of blood chemistry: Secondary | ICD-10-CM

## 2020-06-02 ENCOUNTER — Ambulatory Visit: Payer: No Typology Code available for payment source | Admitting: Endocrinology

## 2020-06-07 ENCOUNTER — Other Ambulatory Visit: Payer: Self-pay | Admitting: Neurology

## 2020-06-07 ENCOUNTER — Other Ambulatory Visit (HOSPITAL_COMMUNITY): Payer: Self-pay | Admitting: Psychiatry

## 2020-06-07 DIAGNOSIS — F3181 Bipolar II disorder: Secondary | ICD-10-CM

## 2020-06-07 DIAGNOSIS — F4312 Post-traumatic stress disorder, chronic: Secondary | ICD-10-CM

## 2020-06-08 ENCOUNTER — Encounter (HOSPITAL_COMMUNITY): Payer: Self-pay | Admitting: Psychiatry

## 2020-06-08 ENCOUNTER — Other Ambulatory Visit: Payer: Self-pay

## 2020-06-08 ENCOUNTER — Telehealth (INDEPENDENT_AMBULATORY_CARE_PROVIDER_SITE_OTHER): Payer: No Typology Code available for payment source | Admitting: Psychiatry

## 2020-06-08 VITALS — Wt 242.0 lb

## 2020-06-08 DIAGNOSIS — F4312 Post-traumatic stress disorder, chronic: Secondary | ICD-10-CM | POA: Diagnosis not present

## 2020-06-08 DIAGNOSIS — F3181 Bipolar II disorder: Secondary | ICD-10-CM | POA: Diagnosis not present

## 2020-06-08 MED ORDER — VENLAFAXINE HCL ER 150 MG PO CP24: 150.0000 mg | ORAL_CAPSULE | Freq: Every day | ORAL | 0 refills | Status: DC

## 2020-06-08 MED ORDER — TRAZODONE HCL 150 MG PO TABS: 150.0000 mg | ORAL_TABLET | Freq: Every day | ORAL | 0 refills | Status: DC

## 2020-06-08 MED ORDER — LATUDA 20 MG PO TABS
ORAL_TABLET | ORAL | 0 refills | Status: DC
Start: 2020-06-08 — End: 2020-07-07

## 2020-06-08 NOTE — Progress Notes (Signed)
Virtual Visit via Telephone Note  I connected with Vincent [name] on 06/08/20 at 10:40 AM EDT by telephone and verified that I am speaking with the correct person using two identifiers.  Location: Patient: home Provider: home office   I discussed the limitations, risks, security and privacy concerns of performing an evaluation and management service by telephone and the availability of in person appointments. I also discussed with the patient that there may be a patient responsible charge related to this service. The patient expressed understanding and agreed to proceed.   History of Present Illness: Patient is evaluated by phone session.  On the last visit we increased Abilify to 10 mg to help his mood lability but he did not notice any improvement.  He continues to endorse mood swings, irritability getting short temper at home and at work.  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  He feels sometimes getting short with them.  He is not sure what triggered it but he also reported poor sleep and racing thoughts.  He is sleeping 3 to 4 hours every night.  However he denies any nightmares or flashbacks.  Recently had a procedure and his left little finger was amputated because of infection.  He is doing better now.  He is a still taking multiple pain medication but overall he reported pain is under control.  He wants to try a different medication as he noticed Abilify did not help his mood and anger.  He denies any suicidal thoughts, hallucination, paranoia or any aggression of violence.  His son is moved out and that helps some of his distress.  His appetite is okay.  His weight is stable.  He has upcoming appointment to see his endocrinologist for hemoglobin A1c.  His last hemoglobin A1c was above 13.  He has no tremors, shakes or any EPS.   Past Psychiatric History:Reviewed. H/[place]one brief hospitalization at [facility] due to suicidal thoughts. No  h/osuicidal attempt. H/[place], anxiety, PTSD and heavy substance use. Claims to be sober for while. Tried Depakote, Zoloft, lithium, Prozac and Xanax. [name] helped but stopped due to polypharmacy.  Psychiatric Specialty Exam: Physical Exam  Review of Systems  Weight 242 lb (109.8 kg).Body mass index is 32.82 kg/m.  General Appearance: NA  Eye Contact:  NA  Speech:  Slow  Volume:  Decreased  Mood:  Dysphoric and Irritable  Affect:  NA  Thought Process:  Descriptions of Associations: Intact  Orientation:  Full (Time, Place, and Person)  Thought Content:  Rumination  Suicidal Thoughts:  No  Homicidal Thoughts:  No  Memory:  Immediate;   Good Recent;   Fair Remote;   Fair  Judgement:  Intact  Insight:  Shallow  Psychomotor Activity:  NA  Concentration:  Concentration: Fair and Attention Span: Fair  Recall:  [employer] of Knowledge:  Fair  Language:  Good  Akathisia:  No  Handed:  Right  AIMS (if indicated):     Assets:  Communication Skills Desire for Improvement Housing Resilience Social Support Talents/Skills  ADL's:  Intact  Cognition:  WNL  Sleep:   3 hrs      Assessment and Plan: Bipolar disorder type II.  PTSD.  Patient like to try a different medication.  Even increase Abilify did not help his mood irritability.  Recommend to cut down the Abilify 5 mg and stop after 1 week.  We will try Latuda 20 mg daily for 1 week and then  40 mg a day.  I also recommend try trazodone 150 mg to help his sleep.  Continue Effexor 150 mg daily.  We talked about psychotropic medication causing increased blood sugar and encourage that he should keep his appointment with his endocrinologist Franco Nones for hemoglobin A1c.  Discussed psychotropic medication side effects and benefits.  He is getting Lamictal, Lyrica, gabapentin and pain medicine from his other provider.  I also recommend he should discuss about his adjusting the dose of Lamictal to help his mood lability as it can  also help his symptoms.  Patient agreed to talk to the provider.  I recommend to call us back if is any question or any concern.  Follow-up in 4 weeks.  Follow Up Instructions:    I discussed the assessment and treatment plan with the patient. The patient was provided an opportunity to ask questions and all were answered. The patient agreed with the plan and demonstrated an understanding of the instructions.   The patient was advised to call back or seek an in-person evaluation if the symptoms worsen or if the condition fails to improve as anticipated.  I provided 18 minutes of non-face-to-face time during this encounter.   Kathlee Nations, MD

## 2020-06-10 ENCOUNTER — Inpatient Hospital Stay
Admission: EM | Admit: 2020-06-10 | Discharge: 2020-06-12 | DRG: 640 | Disposition: A | Discharge status: Home / Self Care | Payer: No Typology Code available for payment source | Attending: Internal Medicine | Admitting: Internal Medicine

## 2020-06-10 ENCOUNTER — Emergency Department: Payer: No Typology Code available for payment source

## 2020-06-10 ENCOUNTER — Inpatient Hospital Stay: Admit: 2020-06-10 | Payer: No Typology Code available for payment source

## 2020-06-10 ENCOUNTER — Other Ambulatory Visit: Payer: Self-pay

## 2020-06-10 ENCOUNTER — Encounter: Payer: Self-pay | Admitting: Radiology

## 2020-06-10 DIAGNOSIS — G4733 Obstructive sleep apnea (adult) (pediatric): Secondary | ICD-10-CM | POA: Diagnosis not present

## 2020-06-10 DIAGNOSIS — K219 Gastro-esophageal reflux disease without esophagitis: Secondary | ICD-10-CM | POA: Diagnosis not present

## 2020-06-10 DIAGNOSIS — Z7984 Long term (current) use of oral hypoglycemic drugs: Secondary | ICD-10-CM

## 2020-06-10 DIAGNOSIS — F4310 Post-traumatic stress disorder, unspecified: Secondary | ICD-10-CM | POA: Diagnosis present

## 2020-06-10 DIAGNOSIS — R739 Hyperglycemia, unspecified: Secondary | ICD-10-CM | POA: Diagnosis not present

## 2020-06-10 DIAGNOSIS — I2510 Atherosclerotic heart disease of native coronary artery without angina pectoris: Secondary | ICD-10-CM | POA: Diagnosis present

## 2020-06-10 DIAGNOSIS — F17210 Nicotine dependence, cigarettes, uncomplicated: Secondary | ICD-10-CM | POA: Diagnosis present

## 2020-06-10 DIAGNOSIS — F419 Anxiety disorder, unspecified: Secondary | ICD-10-CM | POA: Diagnosis present

## 2020-06-10 DIAGNOSIS — E1143 Type 2 diabetes mellitus with diabetic autonomic (poly)neuropathy: Secondary | ICD-10-CM | POA: Diagnosis present

## 2020-06-10 DIAGNOSIS — Z6828 Body mass index (BMI) 28.0-28.9, adult: Secondary | ICD-10-CM

## 2020-06-10 DIAGNOSIS — E039 Hypothyroidism, unspecified: Secondary | ICD-10-CM | POA: Diagnosis not present

## 2020-06-10 DIAGNOSIS — R4781 Slurred speech: Secondary | ICD-10-CM | POA: Diagnosis not present

## 2020-06-10 DIAGNOSIS — E785 Hyperlipidemia, unspecified: Secondary | ICD-10-CM | POA: Diagnosis present

## 2020-06-10 DIAGNOSIS — Z8673 Personal history of transient ischemic attack (TIA), and cerebral infarction without residual deficits: Secondary | ICD-10-CM | POA: Diagnosis not present

## 2020-06-10 DIAGNOSIS — E1142 Type 2 diabetes mellitus with diabetic polyneuropathy: Secondary | ICD-10-CM | POA: Diagnosis present

## 2020-06-10 DIAGNOSIS — R531 Weakness: Secondary | ICD-10-CM | POA: Diagnosis present

## 2020-06-10 DIAGNOSIS — C679 Malignant neoplasm of bladder, unspecified: Secondary | ICD-10-CM | POA: Diagnosis present

## 2020-06-10 DIAGNOSIS — R079 Chest pain, unspecified: Secondary | ICD-10-CM | POA: Diagnosis not present

## 2020-06-10 DIAGNOSIS — G40909 Epilepsy, unspecified, not intractable, without status epilepticus: Secondary | ICD-10-CM | POA: Diagnosis present

## 2020-06-10 DIAGNOSIS — R29810 Facial weakness: Secondary | ICD-10-CM | POA: Diagnosis not present

## 2020-06-10 DIAGNOSIS — G894 Chronic pain syndrome: Secondary | ICD-10-CM | POA: Diagnosis present

## 2020-06-10 DIAGNOSIS — R4701 Aphasia: Secondary | ICD-10-CM | POA: Diagnosis present

## 2020-06-10 DIAGNOSIS — I639 Cerebral infarction, unspecified: Principal | ICD-10-CM

## 2020-06-10 DIAGNOSIS — Z888 Allergy status to other drugs, medicaments and biological substances status: Secondary | ICD-10-CM

## 2020-06-10 DIAGNOSIS — R0789 Other chest pain: Secondary | ICD-10-CM | POA: Diagnosis not present

## 2020-06-10 DIAGNOSIS — E43 Unspecified severe protein-calorie malnutrition: Secondary | ICD-10-CM | POA: Diagnosis not present

## 2020-06-10 DIAGNOSIS — I5021 Acute systolic (congestive) heart failure: Secondary | ICD-10-CM | POA: Diagnosis not present

## 2020-06-10 DIAGNOSIS — G4700 Insomnia, unspecified: Secondary | ICD-10-CM | POA: Diagnosis present

## 2020-06-10 DIAGNOSIS — M069 Rheumatoid arthritis, unspecified: Secondary | ICD-10-CM | POA: Diagnosis present

## 2020-06-10 DIAGNOSIS — Z794 Long term (current) use of insulin: Secondary | ICD-10-CM | POA: Diagnosis not present

## 2020-06-10 DIAGNOSIS — K449 Diaphragmatic hernia without obstruction or gangrene: Secondary | ICD-10-CM | POA: Diagnosis present

## 2020-06-10 DIAGNOSIS — Z833 Family history of diabetes mellitus: Secondary | ICD-10-CM

## 2020-06-10 DIAGNOSIS — E871 Hypo-osmolality and hyponatremia: Secondary | ICD-10-CM | POA: Diagnosis not present

## 2020-06-10 DIAGNOSIS — E1165 Type 2 diabetes mellitus with hyperglycemia: Secondary | ICD-10-CM | POA: Diagnosis not present

## 2020-06-10 DIAGNOSIS — Z79899 Other long term (current) drug therapy: Secondary | ICD-10-CM

## 2020-06-10 DIAGNOSIS — I5022 Chronic systolic (congestive) heart failure: Secondary | ICD-10-CM | POA: Diagnosis present

## 2020-06-10 DIAGNOSIS — Z8249 Family history of ischemic heart disease and other diseases of the circulatory system: Secondary | ICD-10-CM

## 2020-06-10 DIAGNOSIS — J449 Chronic obstructive pulmonary disease, unspecified: Secondary | ICD-10-CM | POA: Diagnosis not present

## 2020-06-10 DIAGNOSIS — I110 Hypertensive heart disease with heart failure: Secondary | ICD-10-CM | POA: Diagnosis present

## 2020-06-10 DIAGNOSIS — N179 Acute kidney failure, unspecified: Secondary | ICD-10-CM | POA: Diagnosis present

## 2020-06-10 DIAGNOSIS — E669 Obesity, unspecified: Secondary | ICD-10-CM | POA: Diagnosis present

## 2020-06-10 DIAGNOSIS — Z89022 Acquired absence of left finger(s): Secondary | ICD-10-CM

## 2020-06-10 DIAGNOSIS — Z20822 Contact with and (suspected) exposure to covid-19: Secondary | ICD-10-CM | POA: Diagnosis not present

## 2020-06-10 DIAGNOSIS — R471 Dysarthria and anarthria: Secondary | ICD-10-CM | POA: Diagnosis not present

## 2020-06-10 DIAGNOSIS — Z882 Allergy status to sulfonamides status: Secondary | ICD-10-CM

## 2020-06-10 DIAGNOSIS — F3181 Bipolar II disorder: Secondary | ICD-10-CM | POA: Diagnosis not present

## 2020-06-10 DIAGNOSIS — Z79890 Hormone replacement therapy (postmenopausal): Secondary | ICD-10-CM

## 2020-06-10 LAB — BETA-HYDROXYBUTYRIC ACID: Beta-Hydroxybutyric Acid: 0.13 mmol/L (ref 0.05–0.27)

## 2020-06-10 LAB — COMPREHENSIVE METABOLIC PANEL
ALT: 16 U/L (ref 0–44)
AST: 19 U/L (ref 15–41)
Albumin: 3.5 g/dL (ref 3.5–5.0)
Alkaline Phosphatase: 81 U/L (ref 38–126)
Anion gap: 11 (ref 5–15)
BUN: 30 mg/dL — ABNORMAL HIGH (ref 6–20)
CO2: 21 mmol/L — ABNORMAL LOW (ref 22–32)
Calcium: 8.7 mg/dL — ABNORMAL LOW (ref 8.9–10.3)
Chloride: 97 mmol/L — ABNORMAL LOW (ref 98–111)
Creatinine, Ser: 1.6 mg/dL — ABNORMAL HIGH (ref 0.61–1.24)
GFR, Estimated: 52 mL/min — ABNORMAL LOW (ref >60)
Glucose, Bld: 694 mg/dL (ref 70–99)
Potassium: 4.8 mmol/L (ref 3.5–5.1)
Sodium: 129 mmol/L — ABNORMAL LOW (ref 135–145)
Total Bilirubin: 0.6 mg/dL (ref 0.3–1.2)
Total Protein: 6.3 g/dL — ABNORMAL LOW (ref 6.5–8.1)

## 2020-06-10 LAB — BLOOD GAS, VENOUS
Acid-base deficit: 1.8 mmol/L (ref 0.0–2.0)
Bicarbonate: 23.1 mmol/L (ref 20.0–28.0)
O2 Saturation: 93.7 %
Patient temperature: 37
pCO2, Ven: 39 mmHg — ABNORMAL LOW (ref 44.0–60.0)
pH, Ven: 7.38 (ref 7.250–7.430)
pO2, Ven: 71 mmHg — ABNORMAL HIGH (ref 32.0–45.0)

## 2020-06-10 LAB — RESP PANEL BY RT-PCR (FLU A&B, COVID) ARPGX2
Influenza A by PCR: NEGATIVE
Influenza B by PCR: NEGATIVE
SARS Coronavirus 2 by RT PCR: NEGATIVE

## 2020-06-10 LAB — GLUCOSE, CAPILLARY
Glucose-Capillary: 403 mg/dL — ABNORMAL HIGH (ref 70–99)
Glucose-Capillary: 329 mg/dL — ABNORMAL HIGH (ref 70–99)
Glucose-Capillary: 104 mg/dL — ABNORMAL HIGH (ref 70–99)
Glucose-Capillary: 93 mg/dL (ref 70–99)
Glucose-Capillary: 102 mg/dL — ABNORMAL HIGH (ref 70–99)
Glucose-Capillary: 251 mg/dL — ABNORMAL HIGH (ref 70–99)
Glucose-Capillary: 256 mg/dL — ABNORMAL HIGH (ref 70–99)

## 2020-06-10 LAB — DIFFERENTIAL
Abs Immature Granulocytes: 0.04 10*3/uL (ref 0.00–0.07)
Basophils Absolute: 0 10*3/uL (ref 0.0–0.1)
Basophils Relative: 1 %
Eosinophils Absolute: 0.1 10*3/uL (ref 0.0–0.5)
Eosinophils Relative: 2 %
Immature Granulocytes: 1 %
Lymphocytes Relative: 31 %
Lymphs Abs: 1.8 10*3/uL (ref 0.7–4.0)
Monocytes Absolute: 0.4 10*3/uL (ref 0.1–1.0)
Monocytes Relative: 7 %
Neutro Abs: 3.5 10*3/uL (ref 1.7–7.7)
Neutrophils Relative %: 58 %

## 2020-06-10 LAB — CBC
HCT: 37.1 % — ABNORMAL LOW (ref 39.0–52.0)
Hemoglobin: 12.7 g/dL — ABNORMAL LOW (ref 13.0–17.0)
MCH: 27.3 pg (ref 26.0–34.0)
MCHC: 34.2 g/dL (ref 30.0–36.0)
MCV: 79.8 fL — ABNORMAL LOW (ref 80.0–100.0)
Platelets: 191 10*3/uL (ref 150–400)
RBC: 4.65 MIL/uL (ref 4.22–5.81)
RDW: 13.1 % (ref 11.5–15.5)
WBC: 5.9 10*3/uL (ref 4.0–10.5)
nRBC: 0 % (ref 0.0–0.2)

## 2020-06-10 LAB — TROPONIN I (HIGH SENSITIVITY)
Troponin I (High Sensitivity): 3 ng/L (ref <18)
Troponin I (High Sensitivity): 4 ng/L (ref <18)

## 2020-06-10 LAB — PROTIME-INR
INR: 1.1 (ref 0.8–1.2)
Prothrombin Time: 13.8 seconds (ref 11.4–15.2)

## 2020-06-10 LAB — MRSA PCR SCREENING: MRSA by PCR: NEGATIVE

## 2020-06-10 LAB — CBG MONITORING, ED: Glucose-Capillary: >600 mg/dL (ref 70–99)

## 2020-06-10 LAB — APTT: aPTT: 32 seconds (ref 24–36)

## 2020-06-10 MED ORDER — PREGABALIN 75 MG PO CAPS
200.0000 mg | ORAL_CAPSULE | Freq: Every morning | ORAL | Status: DC
  Administered 2020-06-11 – 2020-06-12 (×2): 200 mg via ORAL
  Filled 2020-06-10: qty 1
  Filled 2020-06-10: qty 2

## 2020-06-10 MED ORDER — LEVOTHYROXINE SODIUM 25 MCG PO TABS
25.0000 ug | ORAL_TABLET | Freq: Every day | ORAL | Status: DC
  Administered 2020-06-11 – 2020-06-12 (×2): 25 ug via ORAL
  Filled 2020-06-10 (×2): qty 1

## 2020-06-10 MED ORDER — GABAPENTIN 600 MG PO TABS
600.0000 mg | ORAL_TABLET | Freq: Two times a day (BID) | ORAL | Status: DC
  Administered 2020-06-10 – 2020-06-12 (×4): 600 mg via ORAL
  Filled 2020-06-10 (×5): qty 1

## 2020-06-10 MED ORDER — ACETAMINOPHEN 650 MG RE SUPP: 650.0000 mg | RECTAL | Status: DC | PRN

## 2020-06-10 MED ORDER — IOHEXOL 350 MG/ML SOLN
75.0000 mL | Freq: Once | INTRAVENOUS | Status: AC | PRN
  Administered 2020-06-10: 75 mL via INTRAVENOUS

## 2020-06-10 MED ORDER — PREGABALIN 75 MG PO CAPS
400.0000 mg | ORAL_CAPSULE | Freq: Every day | ORAL | Status: DC
  Administered 2020-06-10 – 2020-06-11 (×2): 400 mg via ORAL
  Filled 2020-06-10 (×2): qty 5

## 2020-06-10 MED ORDER — INSULIN GLARGINE 100 UNIT/ML ~~LOC~~ SOLN
10.0000 [IU] | Freq: Every day | SUBCUTANEOUS | Status: DC
  Administered 2020-06-10: 10 [IU] via SUBCUTANEOUS
  Filled 2020-06-10 (×2): qty 0.1

## 2020-06-10 MED ORDER — INSULIN ASPART 100 UNIT/ML ~~LOC~~ SOLN
0.0000 [IU] | Freq: Three times a day (TID) | SUBCUTANEOUS | Status: DC
  Administered 2020-06-11 (×3): 15 [IU] via SUBCUTANEOUS
  Administered 2020-06-12: 11:00:00 11 [IU] via SUBCUTANEOUS
  Filled 2020-06-10 (×4): qty 1

## 2020-06-10 MED ORDER — INSULIN ASPART 100 UNIT/ML ~~LOC~~ SOLN: 3.0000 [IU] | SUBCUTANEOUS | Status: DC

## 2020-06-10 MED ORDER — DEXTROSE 50 % IV SOLN: 0.0000 mL | INTRAVENOUS | Status: DC | PRN

## 2020-06-10 MED ORDER — SODIUM CHLORIDE 0.9 % IV SOLN
50.0000 mL | Freq: Once | INTRAVENOUS | Status: AC
  Administered 2020-06-10: 50 mL via INTRAVENOUS

## 2020-06-10 MED ORDER — ALTEPLASE (STROKE) FULL DOSE INFUSION
90.0000 mg | Freq: Once | INTRAVENOUS | Status: AC
  Administered 2020-06-10: 90 mg via INTRAVENOUS

## 2020-06-10 MED ORDER — VENLAFAXINE HCL ER 75 MG PO CP24
150.0000 mg | ORAL_CAPSULE | Freq: Every day | ORAL | Status: DC
  Administered 2020-06-11 – 2020-06-12 (×2): 150 mg via ORAL
  Filled 2020-06-10 (×2): qty 2

## 2020-06-10 MED ORDER — ACETAMINOPHEN 160 MG/5ML PO SOLN
650.0000 mg | ORAL | Status: DC | PRN
  Filled 2020-06-10: qty 20.3

## 2020-06-10 MED ORDER — PANTOPRAZOLE SODIUM 40 MG IV SOLR
40.0000 mg | Freq: Every day | INTRAVENOUS | Status: DC
  Administered 2020-06-10 – 2020-06-11 (×2): 40 mg via INTRAVENOUS
  Filled 2020-06-10 (×2): qty 40

## 2020-06-10 MED ORDER — FENOFIBRATE 160 MG PO TABS
160.0000 mg | ORAL_TABLET | Freq: Every day | ORAL | Status: DC
  Administered 2020-06-11 – 2020-06-12 (×2): 160 mg via ORAL
  Filled 2020-06-10 (×2): qty 1

## 2020-06-10 MED ORDER — STROKE: EARLY STAGES OF RECOVERY BOOK: Freq: Once | Status: AC

## 2020-06-10 MED ORDER — SODIUM CHLORIDE 0.9 % IV BOLUS
1000.0000 mL | Freq: Once | INTRAVENOUS | Status: AC
  Administered 2020-06-10: 1000 mL via INTRAVENOUS

## 2020-06-10 MED ORDER — LAMOTRIGINE 100 MG PO TABS
150.0000 mg | ORAL_TABLET | Freq: Two times a day (BID) | ORAL | Status: DC
  Administered 2020-06-10 – 2020-06-12 (×4): 150 mg via ORAL
  Filled 2020-06-10: qty 6
  Filled 2020-06-10 (×2): qty 2
  Filled 2020-06-10: qty 6

## 2020-06-10 MED ORDER — LACTATED RINGERS IV SOLN: INTRAVENOUS | Status: DC

## 2020-06-10 MED ORDER — DEXTROSE IN LACTATED RINGERS 5 % IV SOLN: INTRAVENOUS | Status: DC

## 2020-06-10 MED ORDER — INSULIN REGULAR(HUMAN) IN NACL 100-0.9 UT/100ML-% IV SOLN
INTRAVENOUS | Status: DC
  Administered 2020-06-10: 13 [IU]/h via INTRAVENOUS
  Filled 2020-06-10: qty 100

## 2020-06-10 MED ORDER — VITAMIN D (ERGOCALCIFEROL) 1.25 MG (50000 UNIT) PO CAPS: 50000.0000 [IU] | ORAL_CAPSULE | ORAL | Status: DC

## 2020-06-10 MED ORDER — ACETAMINOPHEN 325 MG PO TABS: 650.0000 mg | ORAL_TABLET | ORAL | Status: DC | PRN

## 2020-06-10 MED ORDER — MUPIROCIN 2 % EX OINT
1.0000 "application " | TOPICAL_OINTMENT | Freq: Two times a day (BID) | CUTANEOUS | Status: DC
  Administered 2020-06-10 – 2020-06-12 (×4): 1 via NASAL
  Filled 2020-06-10 (×2): qty 22

## 2020-06-10 MED ORDER — ROSUVASTATIN CALCIUM 10 MG PO TABS
10.0000 mg | ORAL_TABLET | Freq: Every day | ORAL | Status: DC
  Administered 2020-06-11 – 2020-06-12 (×2): 10 mg via ORAL
  Filled 2020-06-10 (×2): qty 1

## 2020-06-10 MED ORDER — INSULIN ASPART 100 UNIT/ML ~~LOC~~ SOLN
0.0000 [IU] | Freq: Every day | SUBCUTANEOUS | Status: DC
  Administered 2020-06-10: 3 [IU] via SUBCUTANEOUS
  Administered 2020-06-11: 2 [IU] via SUBCUTANEOUS
  Filled 2020-06-10 (×2): qty 1

## 2020-06-10 MED ORDER — SODIUM CHLORIDE 0.9% FLUSH
3.0000 mL | Freq: Once | INTRAVENOUS | Status: AC
Start: 2020-06-10 — End: 2020-06-10
  Administered 2020-06-10: 3 mL via INTRAVENOUS

## 2020-06-10 MED ORDER — PREGABALIN 75 MG PO CAPS: 200.0000 mg | ORAL_CAPSULE | ORAL | Status: DC

## 2020-06-10 NOTE — ED Provider Notes (Signed)
[facility] Emergency Department Provider Note  ____________________________________________   Event Date/Time   First MD Initiated Contact with Patient 06/10/20 1044     (approximate)  I have reviewed the triage vital signs and the nursing notes.   HISTORY  Chief Complaint Weakness  HPI Vincent [name] is a 55 y.o. male with coronary disease, chronic pain, bladder cancer, COPD, hypertension, hyperlipidemia who comes in with weakness.  Report of right-sided weakness and some aphasia that started this morning on 3/31 around 9 AM.  He does report having some chest pain as well that is mild, constant, nothing makes it better, nothing makes it worse.  Denies any shortness of breath.  Denies any abdominal pain.      Past Medical History:  Diagnosis Date  . Anxiety   . Benign essential tremor   . Bipolar 2 disorder ([place])    followed by [facility]--- dr s. Adele Schilder  . Bladder cancer (HCC)    recurrent  . CAD (coronary artery disease)    cardiac cath 2003  and 2011 both showed normal coronary arteries w/ preserved lvf;  Non obstructive on CTA Oct 2019.   Marland Kitchen Chronic pain syndrome    back---- [facility] [place]  . Cold extremities    BLE  . COPD (chronic obstructive pulmonary disease) ([place])   . DDD (degenerative disc disease), lumbar   . Diabetic peripheral neuropathy ([place])   . Gastroparesis    followed by dr Henrene Pastor  . GERD (gastroesophageal reflux disease)   . Hiatal hernia   . History of bladder cancer urologist-  previously dr Consuella Lose;  now dr gay   papillay TCC (Ta G1)  s/p TURBT and chemo instillation 2014  . History of chest pain 12/2017   heart cath normal  . History of encephalopathy 05/27/2015   admission w/ acute encephalopathy thought to be secondary to pain meds and COPD  . History of gastric ulcer   . History of Helicobacter pylori infection   . History of kidney stones   . History of TIA (transient ischemic attack) 2008   and 10-19-2018    no residual's  . History of traumatic head injury 2010   w/ LOC  per pt needed stitches, hit in head with a mower blade  . Hyperlipidemia   . Hypertension   . Hypogonadism male    s/p  bilateral orchiectomy  . Hypothyroidism   . Insomnia   . Mild obstructive sleep apnea    study in epic 12-04-2016, no cpap  . PTSD (post-traumatic stress disorder)    chronic  . PTSD (post-traumatic stress disorder)   . RA (rheumatoid arthritis) ([place])    followed by [facility]  . Seizures, transient [facility]) neurologist-  dr Krista Blue--  differential dx complex partial seizure .vs.  mood disorder .vs.  pseudoseizure--  negative EEG's   confusion episodes and staring spells since 11/ 2015   (03-26-2020 per pt wife last seizure 10 /2021)  . Transient confusion NEUOROLOGIST-  DR YAN   Episodes since 11/ 2015--  neurologist dx  differential complex partial seizure  .vs. mood disorder . vs. pseudoseizure  . Type 2 diabetes mellitus treated with insulin [facility])    endocrinologist--- dr Loanne Drilling---  (03-26-2020 pt does not check blood sugar at home)    Patient Active Problem List   Diagnosis Date Noted  . Gangrene of finger of left hand ([place])   . Pain due to onychomycosis of toenails of  both feet 01/28/2020  . Hyperglycemia due to type 2 diabetes mellitus ([place]) 01/07/2020  . Resistance to insulin 01/07/2020  . Right leg weakness 04/24/2019  . Right leg pain 04/24/2019  . Gait abnormality 04/24/2019  . TIA (transient ischemic attack) 10/20/2018  . Uncontrolled type 2 DM with hyperosmolar nonketotic hyperglycemia ([place]) 10/19/2018  . Left sided numbness 10/19/2018  . Diabetic autonomic neuropathy associated with type 2 diabetes mellitus ([place]) 02/01/2018  . DM type 2 with diabetic peripheral neuropathy ([place]) 02/01/2018  . Dyslipidemia 01/31/2018  . Coronary artery disease involving native coronary artery of native heart without angina pectoris 01/31/2018  . Chest pain 01/01/2018  .  Seizure disorder ([place]) 12/19/2016  . Essential tremor 12/19/2016  . Polypharmacy 12/19/2016  . Chronic post-traumatic stress disorder (PTSD) 06/16/2016  . Tremor 06/16/2016  . Tobacco use disorder 05/05/2016  . Psychophysiological insomnia 06/02/2015  . Altered mental status 05/28/2015  . Acute bronchitis 05/28/2015  . Type 2 diabetes mellitus with hyperglycemia ([place]) 05/28/2015  . Hypothyroidism 05/28/2015  . Bipolar 2 disorder ([place]) 05/28/2015  . History of rheumatoid arthritis 05/28/2015  . Chronic pain 05/28/2015  . Depression 03/18/2015  . Carpal tunnel syndrome on left 02/10/2015  . Carpal tunnel syndrome on right 02/10/2015  . Diabetes ([place]) 01/23/2015  . Obesity (BMI 30-39.9) 03/06/2014  . Acute encephalopathy 03/05/2014  . HLD (hyperlipidemia) 03/05/2014  . Anemia, normocytic normochromic 03/05/2014  . Altered mental state   . Helicobacter pylori (H. pylori) infection 11/20/2012  . Protein-calorie malnutrition, severe ([place]) 10/09/2012  . Dysphagia 08/19/2012  . Gastroparesis 08/19/2012  . Bladder tumor 08/08/2012  . Biliary dyskinesia 11/02/2010    Past Surgical History:  Procedure Laterality Date  . AMPUTATION Left 04/28/2020   Procedure: LEFT LITTLE FINGER AMPUTATION;  Surgeon: Newt Minion, MD;  Location: [place];  Service: Orthopedics;  Laterality: Left;  . CARDIAC CATHETERIZATION  12-27-2001  DR Einar Gip  &  05-26-2009  DR Irish Lack   RESULTS FOR BOTH ARE NORMAL CORONARIES AND PERSERVED LVF/ EF 60%  . CARPAL TUNNEL RELEASE Bilateral right 09-16-2003;  left ?  . CARPAL TUNNEL RELEASE Left 02/25/2015   Procedure: LEFT CARPAL TUNNEL RELEASE;  Surgeon: Leanora Cover, MD;  Location: [place];  Service: Orthopedics;  Laterality: Left;  . CYSTOSCOPY N/A 10/10/2012   Procedure: CYSTOSCOPY CLOT EVACUATION FULGERATION OF BLEEDERS ;  Surgeon: Claybon Jabs, MD;  Location[facility];  Service: Urology;  Laterality: N/A;  . CYSTOSCOPY WITH BIOPSY  N/A 11/26/2015   Procedure: CYSTOSCOPY WITH BIOPSY AND FULGURATION;  Surgeon: Kathie Rhodes, MD;  Location: [place];  Service: Urology;  Laterality: N/A;  . ESOPHAGOGASTRODUODENOSCOPY  2014  . LAPAROSCOPIC CHOLECYSTECTOMY  11-17-2010  . ORCHIECTOMY Right 02/21/2016   Procedure: SCROTAL ORCHIECTOMY with TESTICULAR PROSTHESIS IMPLANT;  Surgeon: Kathie Rhodes, MD;  Location: [facility];  Service: Urology;  Laterality: Right;  . ORCHIECTOMY Left 09/02/2018   Procedure: ORCHIECTOMY;  Surgeon: Kathie Rhodes, MD;  Location: [facility];  Service: Urology;  Laterality: Left;  . ROTATOR CUFF REPAIR Right 12/2004  . TRANSURETHRAL RESECTION OF BLADDER TUMOR N/A 08/09/2012   Procedure: TRANSURETHRAL RESECTION OF BLADDER TUMOR (TURBT) WITH GYRUS WITH MITOMYCIN C;  Surgeon: Claybon Jabs, MD;  [facility];  Service: Urology;  Laterality: N/A;  . TRANSURETHRAL RESECTION OF BLADDER TUMOR N/A 03/29/2020   Procedure: TRANSURETHRAL RESECTION OF BLADDER TUMOR (TURBT) and post-op instillation of gemcitabine;  Surgeon: Janith Lima, MD;  Location: [place];  Service: Urology;  Laterality: N/A;  . TRANSURETHRAL RESECTION OF BLADDER TUMOR WITH GYRUS (TURBT-GYRUS) N/A 02/27/2014   Procedure: TRANSURETHRAL RESECTION OF BLADDER TUMOR WITH GYRUS (TURBT-GYRUS);  Surgeon: Claybon Jabs, MD;  Location[facility];  Service: Urology;  Laterality: N/A;    Prior to Admission medications   Medication Sig Start Date End Date Taking? Authorizing Provider  ARIPiprazole (ABILIFY) 10 MG tablet Take 1 tablet (10 mg total) by mouth daily. Patient taking differently: Take 10 mg by mouth daily. Take 1/2 tab for a week and than stop 04/26/20   Arfeen, Arlyce Harman, MD  Continuous Blood Gluc Sensor (FREESTYLE LIBRE 2 SENSOR) MISC 1 Device by Does not apply route every 14 (fourteen) days. Patient taking differently: 1 Device by Does not apply  route every 14 (fourteen) days. Has not received- waiting on Insurance 02/25/20   Renato Shin, MD  doxycycline (VIBRA-TABS) 100 MG tablet Take 1 tablet (100 mg total) by mouth 2 (two) times daily. Patient not taking: Reported on 06/08/2020 05/17/20   Persons, Bevely Palmer, [place]  Dulaglutide (TRULICITY) 4.5 GY/6.9SW SOPN Inject 4.5 mg into the skin once a week. Patient taking differently: Inject 4.5 mg into the skin every Thursday. 01/22/20   Renato Shin, MD  fenofibrate 160 MG tablet Take 160 mg by mouth daily. 12/31/19   [provider]  gabapentin (NEURONTIN) 600 MG tablet Take 600 mg by mouth 2 (two) times daily. 08/20/18   [provider]  hydroxychloroquine (PLAQUENIL) 200 MG tablet Take 200 mg by mouth 2 (two) times daily.    [provider]  insulin glargine, 2 Unit Dial, (TOUJEO MAX SOLOSTAR) 300 UNIT/ML Solostar Pen Inject 180 Units into the skin every morning. And pen needles 1/day Patient taking differently: Inject 180 Units into the skin in the morning. 02/25/20   Renato Shin, MD  isosorbide mononitrate (IMDUR) 120 MG 24 hr tablet Take 1 tablet (120 mg total) by mouth daily. Please make annual appt in April with Dr. Percival Spanish for refills. Thank you Patient taking differently: Take 120 mg by mouth daily. 05/05/19   Minus Breeding, MD  lamoTRIgine (LAMICTAL) 150 MG tablet Take 1 tablet by mouth twice daily 04/27/20   Marcial Pacas, MD  levothyroxine (SYNTHROID, LEVOTHROID) 25 MCG tablet Take 25 mcg by mouth daily before breakfast.     [provider]  lurasidone (LATUDA) 20 MG TABS tablet Take one tab daily for one week and than twice daily 06/08/20   Arfeen, Arlyce Harman, MD  metFORMIN (GLUCOPHAGE-XR) 500 MG 24 hr tablet Take 500 mg by mouth 2 (two) times daily. 08/24/18   [provider]  metoCLOPramide (REGLAN) 10 MG tablet Take 10 mg by mouth 3 (three) times daily with meals.    [provider]  morphine (MS CONTIN) 60 MG 12 hr tablet Take 60 mg by  mouth 2 (two) times daily. Every 12 hours 06/12/19   [provider]  morphine (MSIR) 15 MG tablet Take 15 mg by mouth 2 (two) times daily as needed. For breakthrough pain  (03-26-2020 per pt wife pt takes this twice also 06/11/19   [provider]  omeprazole (PRILOSEC) 40 MG capsule Take 40 mg by mouth daily.    [provider]  Pancrelipase, Lip-Prot-Amyl, (ZENPEP) 5000-24000 units CPEP Take 1-2 capsules by mouth See admin instructions. Takes 1 capsule in the morning and 2 capsules at night    [provider]  pregabalin (LYRICA) 150 MG  capsule Take 150-300 mg by mouth See admin instructions. Take 150 mg by mouth in the morning and 300 mg at night    [provider]  rosuvastatin (CRESTOR) 10 MG tablet Take 10 mg by mouth daily.    [provider]  sucralfate (CARAFATE) 1 g tablet Take 1 g by mouth 2 (two) times daily. 01/26/20   [provider]  tamsulosin (FLOMAX) 0.4 MG CAPS capsule Take 0.8 mg by mouth at bedtime.    [provider]  traZODone (DESYREL) 150 MG tablet Take 1 tablet (150 mg total) by mouth at bedtime. 06/08/20   Arfeen, Arlyce Harman, MD  venlafaxine XR (EFFEXOR-XR) 150 MG 24 hr capsule Take 1 capsule (150 mg total) by mouth daily with breakfast. 06/08/20   Arfeen, Arlyce Harman, MD  Vitamin D, Ergocalciferol, (DRISDOL) 1.25 MG (50000 UNIT) CAPS capsule Take 50,000 Units by mouth every Wednesday. 12/26/19   [provider]  amLODipine (NORVASC) 5 MG tablet Take 5 mg by mouth daily. 04/21/19 08/09/19  [provider]    Allergies Celebrex [celecoxib], Hydrocodone, and Sulfa antibiotics  Family History  Problem Relation Age of Onset  . Diabetes Mother   . Diabetes Father   . Hypertension Father   . Heart attack Father 31       died age 49  . Alcohol abuse Father   . Colon cancer Neg Hx   . Esophageal cancer Neg Hx   . Stomach cancer Neg Hx   . Rectal cancer Neg Hx     Social History Social History    Tobacco Use  . Smoking status: Current Every Day Smoker    Packs/day: 1.50    Years: 38.00    Pack years: 57.00    Types: Cigarettes  . Smokeless tobacco: Never Used  Vaping Use  . Vaping Use: Never used  Substance Use Topics  . Alcohol use: No  . Drug use: No      Review of Systems Constitutional: No fever/chills Eyes: No visual changes. ENT: No sore throat. Cardiovascular: Denies chest pain. Respiratory: Denies shortness of breath. Gastrointestinal: No abdominal pain.  No nausea, no vomiting.  No diarrhea.  No constipation. Genitourinary: Negative for dysuria. Musculoskeletal: Negative for back pain. Skin: Negative for rash. Neurological: Difficulty with speech, right-sided weakness All other ROS negative ____________________________________________   PHYSICAL EXAM:  VITAL SIGNS: Blood pressure 118/68, pulse 69, temperature 98 F (36.7 C), temperature source Oral, resp. rate 18, height 6' (1.829 m), SpO2 94 %.   Constitutional: Alert and oriented. Well appearing and in no acute distress. Eyes: Conjunctivae are normal. EOMI. Head: Atraumatic. Nose: No congestion/rhinnorhea. Mouth/Throat: Mucous membranes are moist.   Neck: No stridor. Trachea Midline. FROM Cardiovascular: Normal rate, regular rhythm. Grossly normal heart sounds.  Good peripheral circulation. Respiratory: Normal respiratory effort.  No retractions. Lungs CTAB. Gastrointestinal: Soft and nontender. No distention. No abdominal bruits.  Musculoskeletal: No lower extremity tenderness nor edema.  No joint effusions. Neurologic: Weakness of his right side with some sensory deficits and some facial droop noted Skin:  Skin is warm, dry and intact. No rash noted. Psychiatric: Mood and affect are normal. Speech and behavior are normal. GU: Deferred   ____________________________________________   LABS (all labs ordered are listed, but only abnormal results are displayed)  Labs Reviewed  CBC -  Abnormal; Notable for the following components:      Result Value   Hemoglobin 12.7 (*)    HCT 37.1 (*)    MCV 79.8 (*)  All other components within normal limits  COMPREHENSIVE METABOLIC PANEL - Abnormal; Notable for the following components:   Sodium 129 (*)    Chloride 97 (*)    CO2 21 (*)    Glucose, Bld 694 (*)    BUN 30 (*)    Creatinine, Ser 1.60 (*)    Calcium 8.7 (*)    Total Protein 6.3 (*)    GFR, Estimated 52 (*)    All other components within normal limits  CBG MONITORING, ED - Abnormal; Notable for the following components:   Glucose-Capillary >600 (*)    All other components within normal limits  RESP PANEL BY RT-PCR (FLU A&B, COVID) ARPGX2  PROTIME-INR  APTT  DIFFERENTIAL  BETA-HYDROXYBUTYRIC ACID  BLOOD GAS, VENOUS  HIV ANTIBODY (ROUTINE TESTING W REFLEX)  I-STAT CREATININE, ED  TROPONIN I (HIGH SENSITIVITY)  TROPONIN I (HIGH SENSITIVITY)   ____________________________________________   ED ECG REPORT I, Vanessa [place], the attending physician, personally viewed and interpreted this ECG.  Sinus rate of 76, mild diffuse ST elevation, T wave version aVL, normal intervals elevation looks more consistent with early repole ____________________________________________  RADIOLOGY   Official radiology report(s): CT Angio Chest/Abd/Pel for Dissection W and/or Wo Contrast  Result Date: 06/10/2020 CLINICAL DATA:  Rule out aortic dissection. EXAM: CT ANGIOGRAPHY CHEST, ABDOMEN AND PELVIS TECHNIQUE: Non-contrast CT of the chest was initially obtained. Multidetector CT imaging through the chest, abdomen and pelvis was performed using the standard protocol during bolus administration of intravenous contrast. Multiplanar reconstructed images and MIPs were obtained and reviewed to evaluate the vascular anatomy. CONTRAST:  34mL OMNIPAQUE IOHEXOL 350 MG/ML SOLN COMPARISON:  CT angiography chest from 10/19/18 FINDINGS: CTA CHEST FINDINGS Cardiovascular: Preferential  opacification of the thoracic aorta. No evidence of thoracic aortic aneurysm or dissection. Aortic atherosclerosis. Coronary artery calcifications. Normal heart size. No pericardial effusion. Mediastinum/Nodes: No enlarged mediastinal, hilar, or axillary lymph nodes. Thyroid gland, trachea, and esophagus demonstrate no significant findings. Lungs/Pleura: No pleural effusion. No airspace consolidation, atelectasis, or pneumothorax. No suspicious pulmonary nodule or mass. Musculoskeletal: No chest wall abnormality. No acute or significant osseous findings. Review of the MIP images confirms the above findings. CTA ABDOMEN AND PELVIS FINDINGS VASCULAR Aorta: Normal caliber aorta without aneurysm, dissection, vasculitis or significant stenosis. Aortic atherosclerosis. Celiac: Patent without evidence of aneurysm, dissection, vasculitis or significant stenosis. SMA: Patent without evidence of aneurysm, dissection, vasculitis or significant stenosis. Renals: Normal appearance of the left renal artery without signs of stenosis. Calcifications at the origin of the right renal artery is identified with approximately 40% luminal stenosis. IMA: Patent without evidence of aneurysm, dissection, vasculitis or significant stenosis. Inflow: Patent without evidence of aneurysm, dissection, vasculitis or significant stenosis. Veins: No obvious venous abnormality within the limitations of this arterial phase study. Review of the MIP images confirms the above findings. NON-VASCULAR Hepatobiliary: No focal liver abnormality is seen. Status post cholecystectomy. No biliary dilatation. Pancreas: Unremarkable. No pancreatic ductal dilatation or surrounding inflammatory changes. Spleen: Normal in size without focal abnormality. Adrenals/Urinary Tract: Normal appearance of the adrenal glands. No kidney mass or signs of hydronephrosis. Gas noted within the lumen of the bladder which may reflect recent instrumentation. No focal bladder  abnormality noted. Stomach/Bowel: The stomach appears nondistended. No dilated loops of large or small bowel. No bowel wall thickening, inflammation or distension. Lymphatic: No adenopathy within the abdomen or pelvis. Reproductive: Prostate is unremarkable. Other: No free fluid or fluid collections. Musculoskeletal: No acute or significant osseous findings. Review of the  MIP images confirms the above findings. IMPRESSION: 1. No findings to suggest aortic dissection. No explanation for patient's chest pain. 2.  Aortic Atherosclerosis (ICD10-I70.0). 3. Coronary artery calcifications 4. Calcification of the proximal right renal artery with approximately 40% luminal stenosis. 5. These results were called by telephone at the time of interpretation on 06/10/2020 at 11:56 am to provider Dr. Quinn Axe at pager [phone], who verbally acknowledged these results. Electronically Signed   By: Kerby Moors M.D.   On: 06/10/2020 11:57   CT HEAD CODE STROKE WO CONTRAST  Result Date: 06/10/2020 CLINICAL DATA:  Code stroke. 55 year old male with right extremity weakness. EXAM: CT HEAD WITHOUT CONTRAST TECHNIQUE: Contiguous axial images were obtained from the base of the skull through the vertex without intravenous contrast. COMPARISON:  Brain MRI 10/20/2018. CTA head and neck 10/19/2018 and earlier. FINDINGS: Brain: No midline shift, ventriculomegaly, mass effect, evidence of mass lesion, intracranial hemorrhage or evidence of cortically based acute infarction. Gray-white matter differentiation is within normal limits throughout the brain. Vascular: Calcified atherosclerosis at the skull base. No suspicious intracranial vascular hyperdensity. Skull: No acute osseous abnormality identified. Sinuses/Orbits: Visualized paranasal sinuses and mastoids are stable and well pneumatized. Other: Visualized orbits and scalp soft tissues are within normal limits. ASPECTS [facility] Stroke Program Early CT Score) Total score (0-10 with 10  being normal): 10 IMPRESSION: 1. Stable and normal noncontrast CT appearance of the brain. ASPECTS 10. 2. These results were communicated to Dr. Concepcion Living. At 11:06 am on 06/10/2020 by text page via the [facility] messaging system. Electronically Signed   By: Genevie Ann M.D.   On: 06/10/2020 11:07   CT ANGIO HEAD CODE STROKE  Result Date: 06/10/2020 CLINICAL DATA:  55 year old male code stroke presentation. EXAM: CT ANGIOGRAPHY HEAD AND NECK TECHNIQUE: Multidetector CT imaging of the head and neck was performed using the standard protocol during bolus administration of intravenous contrast. Multiplanar CT image reconstructions and MIPs were obtained to evaluate the vascular anatomy. Carotid stenosis measurements (when applicable) are obtained utilizing NASCET criteria, using the distal internal carotid diameter as the denominator. CONTRAST:  30mL OMNIPAQUE IOHEXOL 350 MG/ML SOLN COMPARISON:  Head CT without contrast 1051 hours today. Prior CTA head and neck 10/19/2018, brain MRI 10/20/2018. FINDINGS: CTA NECK Skeleton: Absent dentition. Chronic posterior element ankylosis at C3-C4 on the left. No acute osseous abnormality identified. Upper chest: Stable chronic increased interstitial markings in the upper lungs. Negative superior mediastinum. Other neck: Negative. Aortic arch: Calcified aortic atherosclerosis. 3 vessel arch configuration. Right carotid system: Mild brachiocephalic artery and right CCA origin plaque is stable without stenosis. Intermittent soft and calcified plaque in the right CCA before the bifurcation without stenosis. Soft and calcified plaque at the bifurcation and bulky calcified plaque at the right ICA bulb has not significantly changed from 2020. Up to 50% stenosis occurs. Left carotid system: Stable intermittent soft and calcified plaque without hemodynamically significant stenosis. Vertebral arteries: Stable plaque in the proximal right subclavian artery without stenosis. Normal right vertebral  artery origin. Right V1 plaque with only mild stenosis. Intermittent right V2 plaque with only mild stenosis to the skull base. Mild plaque in the proximal left subclavian artery and the non dominant left vertebral artery without significant stenosis. CTA HEAD Posterior circulation: Patent distal vertebral arteries, the right is mildly dominant. Mild bilateral V4 segment plaque without significant stenosis. Normal PICA origins. Patent vertebrobasilar junction. Patent somewhat diminutive basilar artery with chronic irregularity but no significant stenosis. SCA and PCA origins remain patent. Both posterior  communicating arteries are present. Bilateral PCA branches are stable and within normal limits. Anterior circulation: Both ICA siphons are patent. Mild to moderate bilateral siphon calcified plaque is present without significant stenosis. Normal posterior communicating artery origins. Patent carotid termini. Patent MCA and ACA origins. Normal anterior communicating artery. Median artery of the corpus callosum (normal variant). ACA branches are stable and within normal limits. MCA M1 segments and bifurcations are stable and patent without stenosis. The left MCA bifurcates early. Bilateral MCA branches appear stable since 2020. No branch occlusion identified. Venous sinuses: Patent. Anatomic variants: Mildly dominant right vertebral artery. Median artery of the corpus callosum. Review of the MIP images confirms the above findings IMPRESSION: 1. Negative for large vessel occlusion. 2. Widespread atherosclerosis in the head and neck, not significantly changed from a 2020 CTA. Up to 50% proximal right ICA stenosis. No high-grade or other hemodynamically significant stenosis identified. 3. Aortic Atherosclerosis (ICD10-I70.0). Electronically Signed   By: Genevie Ann M.D.   On: 06/10/2020 11:17   CT ANGIO NECK CODE STROKE  Result Date: 06/10/2020 CLINICAL DATA:  55 year old male code stroke presentation. EXAM: CT  ANGIOGRAPHY HEAD AND NECK TECHNIQUE: Multidetector CT imaging of the head and neck was performed using the standard protocol during bolus administration of intravenous contrast. Multiplanar CT image reconstructions and MIPs were obtained to evaluate the vascular anatomy. Carotid stenosis measurements (when applicable) are obtained utilizing NASCET criteria, using the distal internal carotid diameter as the denominator. CONTRAST:  62mL OMNIPAQUE IOHEXOL 350 MG/ML SOLN COMPARISON:  Head CT without contrast 1051 hours today. Prior CTA head and neck 10/19/2018, brain MRI 10/20/2018. FINDINGS: CTA NECK Skeleton: Absent dentition. Chronic posterior element ankylosis at C3-C4 on the left. No acute osseous abnormality identified. Upper chest: Stable chronic increased interstitial markings in the upper lungs. Negative superior mediastinum. Other neck: Negative. Aortic arch: Calcified aortic atherosclerosis. 3 vessel arch configuration. Right carotid system: Mild brachiocephalic artery and right CCA origin plaque is stable without stenosis. Intermittent soft and calcified plaque in the right CCA before the bifurcation without stenosis. Soft and calcified plaque at the bifurcation and bulky calcified plaque at the right ICA bulb has not significantly changed from 2020. Up to 50% stenosis occurs. Left carotid system: Stable intermittent soft and calcified plaque without hemodynamically significant stenosis. Vertebral arteries: Stable plaque in the proximal right subclavian artery without stenosis. Normal right vertebral artery origin. Right V1 plaque with only mild stenosis. Intermittent right V2 plaque with only mild stenosis to the skull base. Mild plaque in the proximal left subclavian artery and the non dominant left vertebral artery without significant stenosis. CTA HEAD Posterior circulation: Patent distal vertebral arteries, the right is mildly dominant. Mild bilateral V4 segment plaque without significant stenosis.  Normal PICA origins. Patent vertebrobasilar junction. Patent somewhat diminutive basilar artery with chronic irregularity but no significant stenosis. SCA and PCA origins remain patent. Both posterior communicating arteries are present. Bilateral PCA branches are stable and within normal limits. Anterior circulation: Both ICA siphons are patent. Mild to moderate bilateral siphon calcified plaque is present without significant stenosis. Normal posterior communicating artery origins. Patent carotid termini. Patent MCA and ACA origins. Normal anterior communicating artery. Median artery of the corpus callosum (normal variant). ACA branches are stable and within normal limits. MCA M1 segments and bifurcations are stable and patent without stenosis. The left MCA bifurcates early. Bilateral MCA branches appear stable since 2020. No branch occlusion identified. Venous sinuses: Patent. Anatomic variants: Mildly dominant right vertebral artery. Median artery of the  corpus callosum. Review of the MIP images confirms the above findings IMPRESSION: 1. Negative for large vessel occlusion. 2. Widespread atherosclerosis in the head and neck, not significantly changed from a 2020 CTA. Up to 50% proximal right ICA stenosis. No high-grade or other hemodynamically significant stenosis identified. 3. Aortic Atherosclerosis (ICD10-I70.0). Electronically Signed   By: Genevie Ann M.D.   On: 06/10/2020 11:17    ____________________________________________   PROCEDURES  Procedure(s) performed (including Critical Care):  .1-3 Lead EKG Interpretation Performed by: Vanessa Cavour, MD Authorized by: Vanessa Crisfield, MD     Interpretation: normal     ECG rate:  60s    ECG rate assessment: normal     Rhythm: sinus rhythm     Ectopy: none     Conduction: normal   .Critical Care Performed by: Vanessa Frazee, MD Authorized by: Vanessa [place], MD   Critical care provider statement:    Critical care time (minutes):  45   Critical care  was necessary to treat or prevent imminent or life-threatening deterioration of the following conditions:  CNS failure or compromise   Critical care was time spent personally by me on the following activities:  Discussions with consultants, evaluation of patient's response to treatment, examination of patient, ordering and performing treatments and interventions, ordering and review of laboratory studies, ordering and review of radiographic studies, pulse oximetry, re-evaluation of patient's condition, obtaining history from patient or surrogate and review of old charts     ____________________________________________   INITIAL IMPRESSION / [place] / ED COURSE  Vincent [name] was evaluated in Emergency Department on 06/10/2020 for the symptoms described in the history of present illness. He was evaluated in the context of the global COVID-19 pandemic, which necessitated consideration that the patient might be at risk for infection with the SARS-CoV-2 virus that causes COVID-19. Institutional protocols and algorithms that pertain to the evaluation of patients at risk for COVID-19 are in a state of rapid change based on information released by regulatory bodies including the CDC and federal and state organizations. These policies and algorithms were followed during the patient's care in the ED.    Patient is a 54 year old who comes in with sudden onset of neurological symptoms.  Stroke code was called by EMS and patient was automatically taken to the CT scanner without evidence of intracranial hemorrhage or LVO.  When patient returned to the room I was able to evaluate patient myself and he was endorsing some chest pain.  I immediately got an EKG that was without evidence of STEMI and added on cardiac markers to evaluate for ACS.  I was also concerned about the possibility of dissection states that patient back to the scanner to rule out before giving TPA.  CT dissection was negative.  I  discussed with the neurologist Dr. Quinn Axe who is aware that patient's sugar was elevated.  Discussed with patient that his symptoms could be from his elevated sugar versus from a stroke.  Patient states that he had elevated sugars in the past he is never had symptoms before.  Dr. Quinn Axe consented him on the pros and cons of the TPA and they have elected to have TPA.   12:02 PM patient now reporting that he has a history of bladder cancer after getting chemotherapy said he sometimes has hematuria.  However the chemotherapy is injected into her bladder.  Patient's hemoglobin is stable and coags are normal and platelets are normal.  We discussed with the  neurology team whether or not we should continue the TPA.  Given the chemotherapy is given into her bladder via cystoscopy I suspect that the bleeding is more from trauma is very mild about per patient.  Therefore neurology and I have decided to continue the TPA.  Labs show elevated sugar but no evidence of DKA.  I discussed the ICU for admission      ____________________________________________   FINAL CLINICAL IMPRESSION(S) / ED DIAGNOSES   Final diagnoses:  Ischemic stroke ([place])  Hyperglycemia      MEDICATIONS GIVEN DURING THIS VISIT:  Medications  sodium chloride flush (NS) 0.9 % injection 3 mL (has no administration in time range)  alteplase (ACTIVASE) 1 mg/mL infusion 90 mg (90 mg Intravenous New Bag/Given 06/10/20 1146)    Followed by  0.9 %  sodium chloride infusion (has no administration in time range)  insulin regular, human (MYXREDLIN) 100 units/ 100 mL infusion (has no administration in time range)  lactated ringers infusion (has no administration in time range)  dextrose 5 % in lactated ringers infusion (has no administration in time range)  dextrose 50 % solution 0-50 mL (has no administration in time range)   stroke: mapping our early stages of recovery book (has no administration in time range)  acetaminophen (TYLENOL)  tablet 650 mg (has no administration in time range)    Or  acetaminophen (TYLENOL) 160 MG/5ML solution 650 mg (has no administration in time range)    Or  acetaminophen (TYLENOL) suppository 650 mg (has no administration in time range)  pantoprazole (PROTONIX) injection 40 mg (has no administration in time range)  iohexol (OMNIPAQUE) 350 MG/ML injection 75 mL (75 mLs Intravenous Contrast Given 06/10/20 1056)  iohexol (OMNIPAQUE) 350 MG/ML injection 75 mL (75 mLs Intravenous Contrast Given 06/10/20 1122)  sodium chloride 0.9 % bolus 1,000 mL (1,000 mLs Intravenous New Bag/Given 06/10/20 1143)     ED Discharge Orders    None       Note:  This document was prepared using Dragon voice recognition software and may include unintentional dictation errors.   Vanessa [place], MD 06/10/20 1247

## 2020-06-10 NOTE — ED Notes (Signed)
Pt arrived went to CT

## 2020-06-10 NOTE — ED Notes (Signed)
Pt back from second CT scan .  Neuro at bedside speaking with family

## 2020-06-10 NOTE — ED Notes (Signed)
Called for transport to bring pt up.

## 2020-06-10 NOTE — Progress Notes (Signed)
SLP Cancellation Note  Patient Details Name: Vincent [name] MRN: [id number] DOB: March 19, 1965   Cancelled treatment:       Reason Eval/Treat Not Completed:  (chart reviewed; NSG consulted). Received order, reviewed chart and consulted ED NSG. Pt currently receiving tPA - will hold on any evaluation until post tx. NSG later reported pt passed his [place] swallow screen - MD and NSG alerted to order oral diet d/t passing the [place] screen. NSG also reported pt's speech was appropriate in conversation. Noted Neurology note, A&O x4 and following commands. Pt has c/o "intermittent episodes of confusion over the past month".  ST services will f/u tomorrow post tPA tx for any needs. Recommend general aspiration precautions w/ all oral intake.      Orinda Kenner, MS, CCC-SLP Speech Language Pathologist Rehab Services [phone] [facility] 06/10/2020, 4:22 PM

## 2020-06-10 NOTE — ED Notes (Signed)
Pharmacy Tech reviewed meds with wife and pt

## 2020-06-10 NOTE — ED Notes (Signed)
Pharmacy at bedside with stroke coordinator Minette Brine

## 2020-06-10 NOTE — Plan of Care (Signed)
Discussed with patient and wife plan of care for the evening, pain management and insulin sliding scale with some teach back displayed.  Wife was very supportive and visited with the patient.

## 2020-06-10 NOTE — Progress Notes (Signed)
   06/10/20 1032  Clinical Encounter Type  Visited With Patient  Visit Type Initial  Referral From Nurse  Consult/Referral To Chaplain  Spiritual Encounters  Spiritual Needs Prayer;Emotional  Chaplain Autumn Patty responded to a Code Stroke in Ed-11, Pt  [employer]. PT transferred to CT for testing. I provide a ministry of presence and prayer. No family present at the time.

## 2020-06-10 NOTE — Consult Note (Signed)
CODE STROKE- PHARMACY COMMUNICATION   Time CODE STROKE called/page received: 1032  Time response to CODE STROKE was made (in person or via phone): 1036 (prior to patient arrival)  Time Stroke Kit retrieved from [place] (only if needed): 1035  Name of Provider/Nurse contacted: [place] with pt/care team through CT - pt met inclusion criteria, no exclusion criteria, LKW 0945 - tpa administered after r/o of dissection with second contrast CT @ 1146  Past Medical History:  Diagnosis Date  . Anxiety   . Benign essential tremor   . Bipolar 2 disorder ([place])    followed by [facility]--- dr s. Adele Schilder  . Bladder cancer (H[facility])    recurrent  . CAD (coronary artery disease)    cardiac cath 2003  and 2011 both showed normal coronary arteries w/ preserved lvf;  Non obstructive on CTA Oct 2019.   Marland Kitchen Chronic pain syndrome    back---- [facility] [place]  . Cold extremities    BLE  . COPD (chronic obstructive pulmonary disease) ([place])   . DDD (degenerative disc disease), lumbar   . Diabetic peripheral neuropathy ([place])   . Gastroparesis    followed by dr Henrene Pastor  . GERD (gastroesophageal reflux disease)   . Hiatal hernia   . History of bladder cancer urologist-  previously dr Consuella Lose;  now dr gay   papillay TCC (Ta G1)  s/p TURBT and chemo instillation 2014  . History of chest pain 12/2017   heart cath normal  . History of encephalopathy 05/27/2015   admission w/ acute encephalopathy thought to be secondary to pain meds and COPD  . History of gastric ulcer   . History of Helicobacter pylori infection   . History of kidney stones   . History of TIA (transient ischemic attack) 2008  and 10-19-2018    no residual's  . History of traumatic head injury 2010   w/ LOC  per pt needed stitches, hit in head with a mower blade  . Hyperlipidemia   . Hypertension   . Hypogonadism male    s/p  bilateral orchiectomy  . Hypothyroidism   . Insomnia   . Mild obstructive  sleep apnea    study in epic 12-04-2016, no cpap  . PTSD (post-traumatic stress disorder)    chronic  . PTSD (post-traumatic stress disorder)   . RA (rheumatoid arthritis) ([place])    followed by [facility]  . Seizures, transient [facility]) neurologist-  dr Krista Blue--  differential dx complex partial seizure .vs.  mood disorder .vs.  pseudoseizure--  negative EEG's   confusion episodes and staring spells since 11/ 2015   (03-26-2020 per pt wife last seizure 10 /2021)  . Transient confusion NEUOROLOGIST-  DR YAN   Episodes since 11/ 2015--  neurologist dx  differential complex partial seizure  .vs. mood disorder . vs. pseudoseizure  . Type 2 diabetes mellitus treated with insulin [facility])    endocrinologist--- dr Loanne Drilling---  (03-26-2020 pt does not check blood sugar at home)   Prior to Admission medications   Medication Sig Start Date End Date Taking? Authorizing Provider  ARIPiprazole (ABILIFY) 10 MG tablet Take 1 tablet (10 mg total) by mouth daily. Patient taking differently: Take 10 mg by mouth daily. Take 1/2 tab for a week and than stop 04/26/20   Arfeen, Arlyce Harman, MD  Continuous Blood Gluc Sensor (FREESTYLE LIBRE 2 SENSOR) MISC 1 Device by Does not apply route every 14 (fourteen) days. Patient taking differently:  1 Device by Does not apply route every 14 (fourteen) days. Has not received- waiting on Insurance 02/25/20   Renato Shin, MD  doxycycline (VIBRA-TABS) 100 MG tablet Take 1 tablet (100 mg total) by mouth 2 (two) times daily. Patient not taking: Reported on 06/08/2020 05/17/20   Persons, Bevely Palmer, [place]  Dulaglutide (TRULICITY) 4.5 SH/7.53YO SOPN Inject 4.5 mg into the skin once a week. Patient taking differently: Inject 4.5 mg into the skin every Thursday. 01/22/20   Renato Shin, MD  fenofibrate 160 MG tablet Take 160 mg by mouth daily. 12/31/19   [provider]  gabapentin (NEURONTIN) 600 MG tablet Take 600 mg by mouth 2 (two) times daily. 08/20/18   [provider]   hydroxychloroquine (PLAQUENIL) 200 MG tablet Take 200 mg by mouth 2 (two) times daily.    [provider]  insulin glargine, 2 Unit Dial, (TOUJEO MAX SOLOSTAR) 300 UNIT/ML Solostar Pen Inject 180 Units into the skin every morning. And pen needles 1/day Patient taking differently: Inject 180 Units into the skin in the morning. 02/25/20   Renato Shin, MD  isosorbide mononitrate (IMDUR) 120 MG 24 hr tablet Take 1 tablet (120 mg total) by mouth daily. Please make annual appt in April with Dr. Percival Spanish for refills. Thank you Patient taking differently: Take 120 mg by mouth daily. 05/05/19   Minus Breeding, MD  lamoTRIgine (LAMICTAL) 150 MG tablet Take 1 tablet by mouth twice daily 04/27/20   Marcial Pacas, MD  levothyroxine (SYNTHROID, LEVOTHROID) 25 MCG tablet Take 25 mcg by mouth daily before breakfast.     [provider]  lurasidone (LATUDA) 20 MG TABS tablet Take one tab daily for one week and than twice daily 06/08/20   Arfeen, Arlyce Harman, MD  metFORMIN (GLUCOPHAGE-XR) 500 MG 24 hr tablet Take 500 mg by mouth 2 (two) times daily. 08/24/18   [provider]  metoCLOPramide (REGLAN) 10 MG tablet Take 10 mg by mouth 3 (three) times daily with meals.    [provider]  morphine (MS CONTIN) 60 MG 12 hr tablet Take 60 mg by mouth 2 (two) times daily. Every 12 hours 06/12/19   [provider]  morphine (MSIR) 15 MG tablet Take 15 mg by mouth 2 (two) times daily as needed. For breakthrough pain  (03-26-2020 per pt wife pt takes this twice also 06/11/19   [provider]  omeprazole (PRILOSEC) 40 MG capsule Take 40 mg by mouth daily.    [provider]  Pancrelipase, Lip-Prot-Amyl, (ZENPEP) 5000-24000 units CPEP Take 1-2 capsules by mouth See admin instructions. Takes 1 capsule in the morning and 2 capsules at night    [provider]  pregabalin (LYRICA) 150 MG capsule Take 150-300 mg by mouth See admin instructions. Take 150 mg by mouth in the  morning and 300 mg at night    [provider]  rosuvastatin (CRESTOR) 10 MG tablet Take 10 mg by mouth daily.    [provider]  sucralfate (CARAFATE) 1 g tablet Take 1 g by mouth 2 (two) times daily. 01/26/20   [provider]  tamsulosin (FLOMAX) 0.4 MG CAPS capsule Take 0.8 mg by mouth at bedtime.    [provider]  traZODone (DESYREL) 150 MG tablet Take 1 tablet (150 mg total) by mouth at bedtime. 06/08/20   Arfeen, Arlyce Harman, MD  venlafaxine XR (EFFEXOR-XR) 150 MG 24 hr capsule Take 1 capsule (150 mg total) by mouth daily with breakfast. 06/08/20   Arfeen,  Arlyce Harman, MD  Vitamin D, Ergocalciferol, (DRISDOL) 1.25 MG (50000 UNIT) CAPS capsule Take 50,000 Units by mouth every Wednesday. 12/26/19   [provider]  amLODipine (NORVASC) 5 MG tablet Take 5 mg by mouth daily. 04/21/19 08/09/19  [provider]    Lu Duffel ,PharmD Clinical Pharmacist  06/10/2020  11:54 AM

## 2020-06-10 NOTE — Progress Notes (Signed)
OT Cancellation Note  Patient Details Name: Vincent [name] MRN: [id number] DOB: 10-Dec-1965   Cancelled Treatment:    Reason Eval/Treat Not Completed: Medical issues which prohibited therapy. Pt with blood glucose of 694 which is contraindicated for therapeutic intervention at this time. OT to re-attempt when pt is next available.   Darleen Crocker, MS, OTR/L , CBIS ascom [phone]  06/10/20, 1:12 PM   06/10/2020, 1:12 PM

## 2020-06-10 NOTE — H&P (Signed)
NAME:  Vincent [name], MRN:  [id number], DOB:  March 25, 1965, LOS: 0 ADMISSION DATE:  06/10/2020 CONSULTATION DATE: 06/10/2020  REFERRING MD:  Beather Arbour CHIEF COMPLAINT: acute weakness  BRIEF SYNOPSIS Admitted to ICU s/p TPA for acute CVA  History of Present Illness:  55 y.o. male with PMH significant for  has a past medical history of Anxiety, Benign essential tremor, Bipolar 2 disorder ([place]), Bladder cancer ([place]), CAD (coronary artery disease), , Seizures, transient ([place]) (neurologist-  dr Krista Blue--  differential dx complex partial seizure .vs.  mood disorder .vs.  pseudoseizure--  negative EEG's), Transient confusion (NEUOROLOGIST-  DR YAN), and Type 2 diabetes mellitus treated with insulin ([place]).   Has a hx uncontrolled DM2, hx of bladder cancer receiving active installation chemotherapy, presented with acute onset R sided weakness and numbness (face/arm/leg) and dysarthria.   Patient also reported shortness of breath, lightheadedness with the event. SBP 110s-120s throughout. Accucheck read high (>600 unable to calculate BG).  CT HEAD NO ACUTE PROCESS Per Neurology,  Patient met inclusion criteria for tPA and had no absolute contraindications. Patient was counseled  about the risks/benefits of tPA and decision was made to proceed.    He also stated that he also had chest pain with the onset of event and that it had persisted at 4-5/10. CTA obtained  r/o aortic dissection-patient was given TPA   + hx prior strokes without residual deficit (most recently at least 2 yrs ago + intermittent episodes of confusion over the past month   He reports that he occasionally has pink tinged urine after receiving chemo via catheter for bladder installation, most recently one week ago.  I was called to admit patient for acute CVA and s/p TPA     Significant Hospital Events: Including procedures, antibiotic start and stop dates in addition to other pertinent events   . 3/31 admitted for acute CVA s/p  TPA    Micro Data:  COVID NEG    Objective   Blood pressure 135/74, pulse 70, temperature (!) 96.8 F (36 C), temperature source Oral, resp. rate 14, height 6' (1.829 m), SpO2 96 %.        Intake/Output Summary (Last 24 hours) at 06/10/2020 1443 Last data filed at 06/10/2020 1430 Gross per 24 hour  Intake 51.52 ml  Output --  Net 51.52 ml   There were no vitals filed for this visit.     Review of Systems:  Gen:  Denies  fever, sweats, chills weight loss  HEENT: Denies blurred vision, double vision, ear pain, eye pain, hearing loss, nose bleeds, sore throat Cardiac:  No dizziness, chest pain or heaviness, chest tightness,edema, No JVD Resp:   No cough, -sputum production, -shortness of breath,-wheezing, -hemoptysis,  Gi: Denies swallowing difficulty, stomach pain, nausea or vomiting, diarrhea, constipation, bowel incontinence Gu:  Denies bladder incontinence, burning urine Ext:   Denies Joint pain, stiffness or swelling Skin: Denies  skin rash, easy bruising or bleeding or hives Endoc:  Denies polyuria, polydipsia , polyphagia or weight change Psych:   Denies depression, insomnia or hallucinations  Other:  All other systems negative   Physical Examination:   General Appearance: No distress  Neuro:without focal findings,  speech normal,  HEENT: PERRLA, EOM intact.   Pulmonary: normal breath sounds, No wheezing.  CardiovascularNormal S1,S2.  No m/r/g.   Abdomen: Benign, Soft, non-tender. Renal:  No costovertebral tenderness  GU:  Not performed at this time. Endoc: No evident thyromegaly Skin:   warm, no rashes, no ecchymosis  Extremities: normal, no cyanosis, clubbing. PSYCHIATRIC: Mood, affect within normal limits.   ALL OTHER ROS ARE NEGATIVE      Labs/imaging that I havepersonally reviewed  (right click and "Reselect all SmartList Selections" daily)    Resolved Hospital Problem list   N/A    ASSESSMENT AND PLAN SYNOPSIS  55 yo white male with  h/p bladder cancer on chemo therapy admitted to ICU for acute CVA s/p TPA with severe hypoerglycemia   NEUROLOGY Acute CVA S/P TPA Follow up TPA order sets Follow up Neurology recs  ENDO - ICU hypoglycemic\Hyperglycemia protocol -check FSBS per protocol  ELECTROLYTES -follow labs as needed -replace as needed -pharmacy consultation and following     Best practice (right click and "Reselect all SmartList Selections" daily)  Diet:  NPO Pain/Anxiety/Delirium protocol (if indicated): No VAP protocol (if indicated): Not indicated DVT prophylaxis: SCD GI prophylaxis: N/A Glucose control:  Insulin gtt Central venous access:  N/A Arterial line:  N/A Foley:  N/A Mobility:  bed rest  PT consulted: Yes Code Status:  full code Disposition: ICU  Labs   CBC: Recent Labs  Lab 06/10/20 1050  WBC 5.9  NEUTROABS 3.5  HGB 12.7*  HCT 37.1*  MCV 79.8*  PLT 672    Basic Metabolic Panel: Recent Labs  Lab 06/10/20 1050  NA 129*  K 4.8  CL 97*  CO2 21*  GLUCOSE 694*  BUN 30*  CREATININE 1.60*  CALCIUM 8.7*   GFR: Estimated Creatinine Clearance: 69.1 mL/min (A) (by C-G formula based on SCr of 1.6 mg/dL (H)). Recent Labs  Lab 06/10/20 1050  WBC 5.9    Liver Function Tests: Recent Labs  Lab 06/10/20 1050  AST 19  ALT 16  ALKPHOS 81  BILITOT 0.6  PROT 6.3*  ALBUMIN 3.5   No results for input(s): LIPASE, AMYLASE in the last 168 hours. No results for input(s): AMMONIA in the last 168 hours.  ABG    Component Value Date/Time   PHART 7.390 05/28/2015 0017   PCO2ART 49.7 (H) 05/28/2015 0017   PO2ART 64.0 (L) 05/28/2015 0017   HCO3 23.1 06/10/2020 1200   TCO2 24 03/29/2020 1139   ACIDBASEDEF 1.8 06/10/2020 1200   O2SAT 93.7 06/10/2020 1200     Coagulation Profile: Recent Labs  Lab 06/10/20 1050  INR 1.1    Cardiac Enzymes: No results for input(s): CKTOTAL, CKMB, CKMBINDEX, TROPONINI in the last 168 hours.  HbA1C: Hemoglobin A1C  Date/Time Value  Ref Range Status  01/22/2020 02:04 PM 13.0 (A) 4.0 - 5.6 % Final  01/05/2020 04:26 PM   Final    Comment:    To high to result   Hgb A1c MFr Bld  Date/Time Value Ref Range Status  01/08/2020 01:00 AM >15.5 (H) 4.8 - 5.6 % Final    Comment:    (NOTE) **Verified by repeat analysis**         Prediabetes: 5.7 - 6.4         Diabetes: >6.4         Glycemic control for adults with diabetes: <7.0   10/20/2018 04:03 AM 11.4 (H) 4.8 - 5.6 % Final    Comment:    (NOTE) Pre diabetes:          5.7%-6.4% Diabetes:              >6.4% Glycemic control for   <7.0% adults with diabetes     CBG: Recent Labs  Lab 06/10/20 1045  GLUCAP >600*  Past Medical History:  He,  has a past medical history of Anxiety, Benign essential tremor, Bipolar 2 disorder ([place]), Bladder cancer ([place]), CAD (coronary artery disease), Chronic pain syndrome, Cold extremities, COPD (chronic obstructive pulmonary disease) ([place]), DDD (degenerative disc disease), lumbar, Diabetic peripheral neuropathy ([place]), Gastroparesis, GERD (gastroesophageal reflux disease), Hiatal hernia, History of bladder cancer (urologist-  previously dr Consuella Lose;  now dr gay), History of chest pain (12/2017), History of encephalopathy (05/27/2015), History of gastric ulcer, History of Helicobacter pylori infection, History of kidney stones, History of TIA (transient ischemic attack) (2008  and 10-19-2018), History of traumatic head injury (2010), Hyperlipidemia, Hypertension, Hypogonadism male, Hypothyroidism, Insomnia, Mild obstructive sleep apnea, PTSD (post-traumatic stress disorder), PTSD (post-traumatic stress disorder), RA (rheumatoid arthritis) ([place]), Seizures, transient ([place]) (neurologist-  dr Krista Blue--  differential dx complex partial seizure .vs.  mood disorder .vs.  pseudoseizure--  negative EEG's), Transient confusion (NEUOROLOGIST-  DR YAN), and Type 2 diabetes mellitus treated with insulin ([place]).   Surgical History:   Past Surgical History:   Procedure Laterality Date  . AMPUTATION Left 04/28/2020   Procedure: LEFT LITTLE FINGER AMPUTATION;  Surgeon: Newt Minion, MD;  Location: [facility];  Service: Orthopedics;  Laterality: Left;  . CARDIAC CATHETERIZATION  12-27-2001  DR Einar Gip  &  05-26-2009  DR Irish Lack   RESULTS FOR BOTH ARE NORMAL CORONARIES AND PERSERVED LVF/ EF 60%  . CARPAL TUNNEL RELEASE Bilateral right 09-16-2003;  left ?  . CARPAL TUNNEL RELEASE Left 02/25/2015   Procedure: LEFT CARPAL TUNNEL RELEASE;  Surgeon: Leanora Cover, MD;  Location: [facility];  Service: Orthopedics;  Laterality: Left;  . CYSTOSCOPY N/A 10/10/2012   Procedure: CYSTOSCOPY CLOT EVACUATION FULGERATION OF BLEEDERS ;  Surgeon: Claybon Jabs, MD;  Location: [facility];  Service: Urology;  Laterality: N/A;  . CYSTOSCOPY WITH BIOPSY N/A 11/26/2015   Procedure: CYSTOSCOPY WITH BIOPSY AND FULGURATION;  Surgeon: Kathie Rhodes, MD;  Location: [facility];  Service: Urology;  Laterality: N/A;  . ESOPHAGOGASTRODUODENOSCOPY  2014  . LAPAROSCOPIC CHOLECYSTECTOMY  11-17-2010  . ORCHIECTOMY Right 02/21/2016   Procedure: SCROTAL ORCHIECTOMY with TESTICULAR PROSTHESIS IMPLANT;  Surgeon: Kathie Rhodes, MD;  Location: [facility];  Service: Urology;  Laterality: Right;  . ORCHIECTOMY Left 09/02/2018   Procedure: ORCHIECTOMY;  Surgeon: Kathie Rhodes, MD;  Location: [facility];  Service: Urology;  Laterality: Left;  . ROTATOR CUFF REPAIR Right 12/2004  . TRANSURETHRAL RESECTION OF BLADDER TUMOR N/A 08/09/2012   Procedure: TRANSURETHRAL RESECTION OF BLADDER TUMOR (TURBT) WITH GYRUS WITH MITOMYCIN C;  Surgeon: Claybon Jabs, MD;  Location[facility];  Service: Urology;  Laterality: N/A;  . TRANSURETHRAL RESECTION OF BLADDER TUMOR N/A 03/29/2020   Procedure: TRANSURETHRAL RESECTION OF BLADDER TUMOR (TURBT) and post-op instillation of gemcitabine;  Surgeon: Janith Lima, MD;  Location:  [facility] [address];  Service: Urology;  Laterality: N/A;  . TRANSURETHRAL RESECTION OF BLADDER TUMOR WITH GYRUS (TURBT-GYRUS) N/A 02/27/2014   Procedure: TRANSURETHRAL RESECTION OF BLADDER TUMOR WITH GYRUS (TURBT-GYRUS);  Surgeon: Claybon Jabs, MD;  Location: [facility];  Service: Urology;  Laterality: N/A;     Social History:   reports that he has been smoking cigarettes. He has a 57.00 pack-year smoking history. He has never used smokeless tobacco. He reports that he does not drink alcohol and does not use drugs.   Family History:  His family history includes Alcohol abuse in his father; Diabetes in his  father and mother; Heart attack (age of onset: 60) in his father; Hypertension in his father. There is no history of Colon cancer, Esophageal cancer, Stomach cancer, or Rectal cancer.   Allergies Allergies  Allergen Reactions  . Celebrex [Celecoxib] Anaphylaxis and Rash  . Hydrocodone Rash and Other (See Comments)    "blisters developed on arms"  . Sulfa Antibiotics Rash     Home Medications  Prior to Admission medications   Medication Sig Start Date End Date Taking? Authorizing Provider  ARIPiprazole (ABILIFY) 10 MG tablet Take 1 tablet (10 mg total) by mouth daily. Patient taking differently: Take 10 mg by mouth daily. Take 1/2 tab for a week and than stop 04/26/20  Yes Arfeen, Arlyce Harman, MD  Continuous Blood Gluc Sensor (FREESTYLE LIBRE 2 SENSOR) MISC 1 Device by Does not apply route every 14 (fourteen) days. Patient taking differently: 1 Device by Does not apply route every 14 (fourteen) days. Has not received- waiting on Insurance 02/25/20  Yes Renato Shin, MD  Dulaglutide (TRULICITY) 4.5 VB/1.6OM SOPN Inject 4.5 mg into the skin once a week. Patient taking differently: Inject 4.5 mg into the skin every Thursday. 01/22/20  Yes Renato Shin, MD  fenofibrate 160 MG tablet Take 160 mg by mouth daily. 12/31/19  Yes [provider]  gabapentin  (NEURONTIN) 600 MG tablet Take 600 mg by mouth 2 (two) times daily. 08/20/18  Yes [provider]  insulin glargine, 2 Unit Dial, (TOUJEO MAX SOLOSTAR) 300 UNIT/ML Solostar Pen Inject 180 Units into the skin every morning. And pen needles 1/day Patient taking differently: Inject 180 Units into the skin in the morning. 02/25/20  Yes Renato Shin, MD  isosorbide mononitrate (IMDUR) 120 MG 24 hr tablet Take 1 tablet (120 mg total) by mouth daily. Please make annual appt in April with Dr. Percival Spanish for refills. Thank you Patient taking differently: Take 120 mg by mouth daily. 05/05/19  Yes Minus Breeding, MD  lamoTRIgine (LAMICTAL) 150 MG tablet Take 1 tablet by mouth twice daily 04/27/20  Yes Marcial Pacas, MD  levothyroxine (SYNTHROID, LEVOTHROID) 25 MCG tablet Take 25 mcg by mouth daily before breakfast.    Yes [provider]  metFORMIN (GLUCOPHAGE-XR) 500 MG 24 hr tablet Take 500 mg by mouth 2 (two) times daily. 08/24/18  Yes [provider]  metoCLOPramide (REGLAN) 10 MG tablet Take by mouth 3 (three) times daily with meals.   Yes [provider]  morphine (MS CONTIN) 60 MG 12 hr tablet Take 60 mg by mouth 2 (two) times daily. Every 12 hours 06/12/19  Yes [provider]  morphine (MSIR) 15 MG tablet Take 15 mg by mouth 2 (two) times daily as needed. For breakthrough pain  (03-26-2020 per pt wife pt takes this twice also 06/11/19  Yes [provider]  omeprazole (PRILOSEC) 40 MG capsule Take 40 mg by mouth daily.   Yes [provider]  Pancrelipase, Lip-Prot-Amyl, (ZENPEP) 5000-24000 units CPEP Take 1-2 capsules by mouth See admin instructions. Takes 1 capsule in the morning and 2 capsules at night   Yes [provider]  pregabalin (LYRICA) 200 MG capsule Take 200 mg by mouth See admin instructions. Take 200 mg by mouth in the morning and 400 mg at night   Yes [provider]  rosuvastatin (CRESTOR) 10 MG tablet Take 10 mg by  mouth daily.   Yes [provider]  sucralfate (CARAFATE) 1 g tablet Take 1 g by mouth 2 (two) times daily. 01/26/20  Yes [provider]  tamsulosin (FLOMAX) 0.4 MG CAPS capsule Take 0.8 mg by mouth at bedtime.   Yes [provider]  traZODone (DESYREL) 150 MG tablet Take 1 tablet (150 mg total) by mouth at bedtime. 06/08/20  Yes Arfeen, Arlyce Harman, MD  venlafaxine XR (EFFEXOR-XR) 150 MG 24 hr capsule Take 1 capsule (150 mg total) by mouth daily with breakfast. 06/08/20  Yes Arfeen, Arlyce Harman, MD  Vitamin D, Ergocalciferol, (DRISDOL) 1.25 MG (50000 UNIT) CAPS capsule Take 50,000 Units by mouth every Wednesday. 12/26/19  Yes [provider]  lurasidone (LATUDA) 20 MG TABS tablet Take one tab daily for one week and than twice daily 06/08/20   Arfeen, Arlyce Harman, MD  amLODipine (NORVASC) 5 MG tablet Take 5 mg by mouth daily. 04/21/19 08/09/19  [provider]       DVT/GI PRX  assessed I Assessed the need for Labs I Assessed the need for Foley I Assessed the need for Central Venous Line Family Discussion when available I Assessed the need for Mobilization I made an Assessment of medications to be adjusted accordingly Safety Risk assessment completed  CASE DISCUSSED IN MULTIDISCIPLINARY ROUNDS WITH ICU TEAM    TRANSFER TO [facility] TOMORROW    Corrin Parker, M.D.  [facility]  Medical Director [place] Director [place] Department

## 2020-06-10 NOTE — ED Notes (Signed)
Floor Nurse advised to bring pt up .

## 2020-06-10 NOTE — Consult Note (Addendum)
NEUROLOGY CONSULTATION NOTE   Date of service: June 10, 2020 Patient Name: Vincent [name] MRN:  [id number] DOB:  February 19, 1966 Reason for consult: stroke code for acute onset R sided weakness and numbess (face/arm/leg) and dysarthria @ 71 _ _ _   _ __   _ __ _ _  __ __   _ __   __ _  History of Present Illness   Vincent [name] is a 55 y.o. male with PMH significant for  has a past medical history of Anxiety, Benign essential tremor, Bipolar 2 disorder ([place]), Bladder cancer ([place]), CAD (coronary artery disease), Chronic pain syndrome, Cold extremities, COPD (chronic obstructive pulmonary disease) ([place]), DDD (degenerative disc disease), lumbar, Diabetic peripheral neuropathy ([place]), Gastroparesis, GERD (gastroesophageal reflux disease), Hiatal hernia, History of bladder cancer (urologist-  previously dr Consuella Lose;  now dr gay), History of chest pain (12/2017), History of encephalopathy (05/27/2015), History of gastric ulcer, History of Helicobacter pylori infection, History of kidney stones, History of TIA (transient ischemic attack) (2008  and 10-19-2018), History of traumatic head injury (2010), Hyperlipidemia, Hypertension, Hypogonadism male, Hypothyroidism, Insomnia, Mild obstructive sleep apnea, PTSD (post-traumatic stress disorder), PTSD (post-traumatic stress disorder), RA (rheumatoid arthritis) ([place]), Seizures, transient ([place]) (neurologist-  dr Krista Blue--  differential dx complex partial seizure .vs.  mood disorder .vs.  pseudoseizure--  negative EEG's), Transient confusion (NEUOROLOGIST-  DR YAN), and Type 2 diabetes mellitus treated with insulin ([place]).   Patient is a 55 yo man with hx uncontrolled DM2, hx of bladder cancer receiving active installation chrmotherapy, remote TBI 2010, spells who was BIB EMS as stroke code for acute onset R sided weakness and numbness (face/arm/leg) and dysarthria. LKW 0945. Patient also reported shortness of breath, lightheadedness with the event. SBP 110s-120s throughout.  Accucheck read high (>600 unable to calculate BG). NIHSS on arrival = 4 for facial droop, RLE drift, sensory deficit, and dysarthria.  CTH showed NAICP, CTA showed no LVO, CTP not indicated (imaging personally reviewed). Pt met inclusion criteria for tPA and had no exclusion criteria met. I counseled him about the risks/benefits of tPA and decision was made to proceed. Before bolus was administered however he stated that he also had chest pain with the onset of event and that it had persisted at 4-5/10. After discussion with ED decision was made to delay tPA to get another CTA to r/o aortic dissection. Imaging was negative for dissection and tPA was immediately given after radiology called with their findings. No indication for intervention 2/2 no LVO.  Pt reported hx prior strokes without residual deficit (most recently at least 2 yrs ago, he cannot remember what presenting sx were). He has had intermittent episodes of confusion over the past month and a few episodes of briefly feeling weak on the R which completely resolved within minutes earlier this month. He reports that he occasionally has pink tinged urine after receiving chemo via catheter for bladder installation, most recently one week ago.    ROS   10 point review of systems was performed and was negative except as described in HPI.  Past History   Past Medical History:  Diagnosis Date  . Anxiety   . Benign essential tremor   . Bipolar 2 disorder ([place])    followed by [facility]--- dr s. Adele Schilder  . Bladder cancer (HCC)    recurrent  . CAD (coronary artery disease)    cardiac cath 2003  and 2011 both showed normal coronary arteries w/ preserved lvf;  Non obstructive on CTA Oct 2019.   Marland Kitchen  Chronic pain syndrome    back---- [facility] [place]  . Cold extremities    BLE  . COPD (chronic obstructive pulmonary disease) ([place])   . DDD (degenerative disc disease), lumbar   . Diabetic peripheral neuropathy ([place])   .  Gastroparesis    followed by dr Henrene Pastor  . GERD (gastroesophageal reflux disease)   . Hiatal hernia   . History of bladder cancer urologist-  previously dr Consuella Lose;  now dr gay   papillay TCC (Ta G1)  s/p TURBT and chemo instillation 2014  . History of chest pain 12/2017   heart cath normal  . History of encephalopathy 05/27/2015   admission w/ acute encephalopathy thought to be secondary to pain meds and COPD  . History of gastric ulcer   . History of Helicobacter pylori infection   . History of kidney stones   . History of TIA (transient ischemic attack) 2008  and 10-19-2018    no residual's  . History of traumatic head injury 2010   w/ LOC  per pt needed stitches, hit in head with a mower blade  . Hyperlipidemia   . Hypertension   . Hypogonadism male    s/p  bilateral orchiectomy  . Hypothyroidism   . Insomnia   . Mild obstructive sleep apnea    study in epic 12-04-2016, no cpap  . PTSD (post-traumatic stress disorder)    chronic  . PTSD (post-traumatic stress disorder)   . RA (rheumatoid arthritis) ([place])    followed by [facility]  . Seizures, transient [facility]) neurologist-  dr Krista Blue--  differential dx complex partial seizure .vs.  mood disorder .vs.  pseudoseizure--  negative EEG's   confusion episodes and staring spells since 11/ 2015   (03-26-2020 per pt wife last seizure 10 /2021)  . Transient confusion NEUOROLOGIST-  DR YAN   Episodes since 11/ 2015--  neurologist dx  differential complex partial seizure  .vs. mood disorder . vs. pseudoseizure  . Type 2 diabetes mellitus treated with insulin [facility])    endocrinologist--- dr Loanne Drilling---  (03-26-2020 pt does not check blood sugar at home)   Past Surgical History:  Procedure Laterality Date  . AMPUTATION Left 04/28/2020   Procedure: LEFT LITTLE FINGER AMPUTATION;  Surgeon: Newt Minion, MD;  Location: [place];  Service: Orthopedics;  Laterality: Left;  . CARDIAC CATHETERIZATION  12-27-2001  DR Einar Gip  &  05-26-2009  DR  Irish Lack   RESULTS FOR BOTH ARE NORMAL CORONARIES AND PERSERVED LVF/ EF 60%  . CARPAL TUNNEL RELEASE Bilateral right 09-16-2003;  left ?  . CARPAL TUNNEL RELEASE Left 02/25/2015   Procedure: LEFT CARPAL TUNNEL RELEASE;  Surgeon: Leanora Cover, MD;  Location: [place];  Service: Orthopedics;  Laterality: Left;  . CYSTOSCOPY N/A 10/10/2012   Procedure: CYSTOSCOPY CLOT EVACUATION FULGERATION OF BLEEDERS ;  Surgeon: Claybon Jabs, MD;  Location[facility];  Service: Urology;  Laterality: N/A;  . CYSTOSCOPY WITH BIOPSY N/A 11/26/2015   Procedure: CYSTOSCOPY WITH BIOPSY AND FULGURATION;  Surgeon: Kathie Rhodes, MD;  Location: [place];  Service: Urology;  Laterality: N/A;  . ESOPHAGOGASTRODUODENOSCOPY  2014  . LAPAROSCOPIC CHOLECYSTECTOMY  11-17-2010  . ORCHIECTOMY Right 02/21/2016   Procedure: SCROTAL ORCHIECTOMY with TESTICULAR PROSTHESIS IMPLANT;  Surgeon: Kathie Rhodes, MD;  Location: [facility];  Service: Urology;  Laterality: Right;  . ORCHIECTOMY Left 09/02/2018   Procedure: ORCHIECTOMY;  Surgeon: Kathie Rhodes, MD;  Location: [facility];  Service: Urology;  Laterality: Left;  . ROTATOR CUFF REPAIR Right 12/2004  . TRANSURETHRAL RESECTION OF BLADDER TUMOR N/A 08/09/2012   Procedure: TRANSURETHRAL RESECTION OF BLADDER TUMOR (TURBT) WITH GYRUS WITH MITOMYCIN C;  Surgeon: Claybon Jabs, MD;  Location: [facility];  Service: Urology;  Laterality: N/A;  . TRANSURETHRAL RESECTION OF BLADDER TUMOR N/A 03/29/2020   Procedure: TRANSURETHRAL RESECTION OF BLADDER TUMOR (TURBT) and post-op instillation of gemcitabine;  Surgeon: Janith Lima, MD;  Location: [facility];  Service: Urology;  Laterality: N/A;  . TRANSURETHRAL RESECTION OF BLADDER TUMOR WITH GYRUS (TURBT-GYRUS) N/A 02/27/2014   Procedure: TRANSURETHRAL RESECTION OF BLADDER TUMOR WITH GYRUS (TURBT-GYRUS);  Surgeon: Claybon Jabs, MD;   Location: [facility];  Service: Urology;  Laterality: N/A;   Family History  Problem Relation Age of Onset  . Diabetes Mother   . Diabetes Father   . Hypertension Father   . Heart attack Father 49       died age 13  . Alcohol abuse Father   . Colon cancer Neg Hx   . Esophageal cancer Neg Hx   . Stomach cancer Neg Hx   . Rectal cancer Neg Hx    Social History   Socioeconomic History  . Marital status: Married    Spouse name: Not on file  . Number of children: 3  . Years of education: GED  . Highest education level: Not on file  Occupational History  . Occupation: Engineer, technical sales    Comment: Owner of company  Tobacco Use  . Smoking status: Current Every Day Smoker    Packs/day: 1.50    Years: 38.00    Pack years: 57.00    Types: Cigarettes  . Smokeless tobacco: Never Used  Vaping Use  . Vaping Use: Never used  Substance and Sexual Activity  . Alcohol use: No  . Drug use: No  . Sexual activity: Not Currently    Partners: Female    Birth control/protection: None  Other Topics Concern  . Not on file  Social History Narrative   Lives at home with his wife and children.   Left-handed.   3-4 cups caffeine per day.   Social Determinants of Health   Financial Resource Strain: Not on file  Food Insecurity: Not on file  Transportation Needs: Not on file  Physical Activity: Not on file  Stress: Not on file  Social Connections: Not on file   Allergies  Allergen Reactions  . Celebrex [Celecoxib] Anaphylaxis and Rash  . Hydrocodone Rash and Other (See Comments)    "blisters developed on arms"  . Sulfa Antibiotics Rash    Medications   (Not in a hospital admission)    Vitals   Vitals:   06/10/20 1230 06/10/20 1245 06/10/20 1300 06/10/20 1315  BP: 118/68 110/74 122/71 111/68  Pulse: 69 69 74 73  Resp: '18 14 14 12  ' Temp:      TempSrc:      SpO2: 94% 96% 91% 91%  Height:         Body mass index is 32.82 kg/m.  Physical Exam    Physical Exam Gen: A&O x4, NAD HEENT: Atraumatic, normocephalic;mucous membranes moist; oropharynx clear, tongue without atrophy or fasciculations. Neck: Supple, trachea midline. Resp: CTAB, no w/r/r CV: RRR, no m/g/r; nml S1 and S2. 2+ symmetric peripheral pulses. Abd: soft/NT/ND; nabs x 4 quad Extrem: Nml bulk; no cyanosis, clubbing, or edema.  Neuro: *MS: A&O  x4. Able to follow commands *Speech: mild dysarthria, no aphasia, able to name and repeat *CN:    I: Deferred   II,III: PERRLA, VFF by confrontation   III,IV,VI: EOMI w/o nystagmus, no ptosis   V: Sensation intact from V1 to V3 to LT   VII: Eyelid closure was full.  R UMN facial droop   VIII: Hearing intact to voice   IX,X: Voice normal, palate elevates symmetrically    XI: SCM/trap 5/5 bilat   XII: Tongue protrudes midline, no atrophy or fasciculations   *Motor:   Normal bulk.  No tremor, rigidity or bradykinesia. 5/5 strength throughout except RUE is anti-gravity with drift. *Sensory: Decreased sensation to LT RUE and RLE. No double-simultaneous extinction.  *Coordination:  Finger-to-nose, heel-to-shin, intact. *Reflexes:  2+ and symmetric throughout without clonus; toes down-going bilat *Gait: deferred  NIHSS @ 1040 = 4 (1 point each for facial droop, RLE drift, sensory, dysarthria)   Labs   CBC:  Recent Labs  Lab 06/10/20 1050  WBC 5.9  NEUTROABS 3.5  HGB 12.7*  HCT 37.1*  MCV 79.8*  PLT 106    Basic Metabolic Panel:  Lab Results  Component Value Date   NA 129 (L) 06/10/2020   K 4.8 06/10/2020   CO2 21 (L) 06/10/2020   GLUCOSE 694 (HH) 06/10/2020   BUN 30 (H) 06/10/2020   CREATININE 1.60 (H) 06/10/2020   CALCIUM 8.7 (L) 06/10/2020   GFRNONAA 52 (L) 06/10/2020   GFRAA 46 (L) 08/09/2019   Lipid Panel:  Lab Results  Component Value Date   LDLCALC UNABLE TO CALCULATE IF TRIGLYCERIDE OVER 400 mg/dL 10/20/2018   HgbA1c:  Lab Results  Component Value Date   HGBA1C 13.0 (A) 01/22/2020    Urine Drug Screen:     Component Value Date/Time   LABOPIA NONE DETECTED 10/19/2018 1300   COCAINSCRNUR NONE DETECTED 10/19/2018 1300   COCAINSCRNUR NONE DETECTED 05/06/2016 0019   LABBENZ NONE DETECTED 10/19/2018 1300   AMPHETMU NONE DETECTED 10/19/2018 1300   THCU NONE DETECTED 10/19/2018 1300   LABBARB NONE DETECTED 10/19/2018 1300    Alcohol Level     Component Value Date/Time   ETH <10 10/19/2018 1127    Impression   Patient is a 55 yo man with hx uncontrolled DM2, hx of bladder cancer receiving active installation chrmotherapy, remote TBI 2010, spells who was BIB EMS as stroke code for acute onset R sided weakness and numbness (face/arm/leg) and dysarthria. LKW 0945. Patient also reported shortness of breath, lightheadedness with the event. SBP 110s-120s throughout. Accucheck read high (>600 unable to calculate BG). NIHSS on arrival = 4 for facial droop, RLE drift, sensory deficit, and dysarthria. tPA was administered within 2 hours of LKW but DTN was >60 min 2/2 delay necessary to r/o dissection after patient reported chest pain. No intervention indicated 2/2 no LVO.  Recommendations   - Admit to ICU - Neurochecks and NIHSS documentation per post-tPA protocol - STAT head CT for any change in neurologic exam - Non-con head CT 24 hrs post-tPA r/o hemorrhagic conversion - MRI brain - Vascular imaging already performed - TTE - no aspirin for 24 hours post IV t-PA and until ICH ruled out by a head CT - keep SBP less than 180 for the 1st 24 hours post IV t-PA to reduce the risk of hemorrhagic transformation - SCDs for DVT prophylaxis; can start SQ Heparin if head CT 24 hours post IV tPA is negative for ICH - NPO; swallow study - PT/OT  and speech therapy - stroke education - outpatient f/u with neurology after discharge - mgmt of hyperglycemia per ICU team - Will continue to follow ______________________________________________________________________   Thank you for the  opportunity to take part in the care of this patient. If you have any further questions, please contact the neurology consultation attending.    This patient is critically ill and at significant risk of neurological worsening, death and care requires constant monitoring of vital signs, hemodynamics,respiratory and cardiac monitoring, neurological assessment, discussion with family, other specialists and medical decision making of high complexity. I spent 110 minutes of neurocritical care time  in the care of  this patient.   Su Monks, MD [facility] [phone]  If 7pm- 7am, please page neurology on call as listed in [place].

## 2020-06-10 NOTE — ED Notes (Signed)
Called MD funke and advised pt back from CT, and advised EKG at her desk .

## 2020-06-10 NOTE — ED Notes (Signed)
Called CT and advised MD wants another CT. RN Minette Brine going with pt .

## 2020-06-10 NOTE — Code Documentation (Signed)
Pt arrives via EMS pre-activated as a code stroke, LKW 945 arrives with facial droop, right sided weakness, and slurred speech,  neurology, pharmacy, and RN present upon pt arrival, CBG read "high", pt cleared for CT and taken to CT, initial NIHSS 4, CTA preformed, pt verbally consented to tPA, returned to room for administration but pt then states worsening chest pain, pt returned to CT for CTA to rule our dissection, after radiology cleared CTA for no dissection tPA given, report off to [facility]

## 2020-06-10 NOTE — ED Notes (Signed)
Full NIH due at 1:45pm today per stroke coordinator

## 2020-06-11 ENCOUNTER — Telehealth (HOSPITAL_COMMUNITY): Payer: Self-pay | Admitting: *Deleted

## 2020-06-11 ENCOUNTER — Inpatient Hospital Stay: Payer: No Typology Code available for payment source

## 2020-06-11 ENCOUNTER — Encounter: Payer: Self-pay | Admitting: Internal Medicine

## 2020-06-11 ENCOUNTER — Inpatient Hospital Stay (HOSPITAL_COMMUNITY)
Admit: 2020-06-11 | Discharge: 2020-06-11 | Disposition: A | Discharge status: Home / Self Care | Payer: No Typology Code available for payment source | Attending: Internal Medicine | Admitting: Internal Medicine

## 2020-06-11 DIAGNOSIS — Z720 Tobacco use: Secondary | ICD-10-CM

## 2020-06-11 DIAGNOSIS — I5021 Acute systolic (congestive) heart failure: Secondary | ICD-10-CM | POA: Diagnosis not present

## 2020-06-11 LAB — BASIC METABOLIC PANEL
Anion gap: 10 (ref 5–15)
BUN: 25 mg/dL — ABNORMAL HIGH (ref 6–20)
CO2: 21 mmol/L — ABNORMAL LOW (ref 22–32)
Calcium: 8.8 mg/dL — ABNORMAL LOW (ref 8.9–10.3)
Chloride: 105 mmol/L (ref 98–111)
Creatinine, Ser: 1.11 mg/dL (ref 0.61–1.24)
GFR, Estimated: >60 mL/min (ref >60)
Glucose, Bld: 373 mg/dL — ABNORMAL HIGH (ref 70–99)
Potassium: 4 mmol/L (ref 3.5–5.1)
Sodium: 136 mmol/L (ref 135–145)

## 2020-06-11 LAB — HIV ANTIBODY (ROUTINE TESTING W REFLEX): HIV Screen 4th Generation wRfx: NONREACTIVE

## 2020-06-11 LAB — ECHOCARDIOGRAM COMPLETE
AR max vel: 2.91 cm2
AV Area VTI: 2.92 cm2
AV Area mean vel: 2.68 cm2
AV Mean grad: 5 mmHg
AV Peak grad: 8.2 mmHg
Ao pk vel: 1.43 m/s
Area-P 1/2: 2.9 cm2
Height: 72 in
MV VTI: 3.84 cm2
S' Lateral: 2.9 cm
Weight: 3343.94 oz

## 2020-06-11 LAB — LIPID PANEL
Cholesterol: 172 mg/dL (ref 0–200)
HDL: 36 mg/dL — ABNORMAL LOW (ref >40)
LDL Cholesterol: 59 mg/dL (ref 0–99)
Total CHOL/HDL Ratio: 4.8 RATIO
Triglycerides: 386 mg/dL — ABNORMAL HIGH (ref <150)
VLDL: 77 mg/dL — ABNORMAL HIGH (ref 0–40)

## 2020-06-11 LAB — CBC WITH DIFFERENTIAL/PLATELET
Abs Immature Granulocytes: 0.04 10*3/uL (ref 0.00–0.07)
Basophils Absolute: 0.1 10*3/uL (ref 0.0–0.1)
Basophils Relative: 1 %
Eosinophils Absolute: 0.2 10*3/uL (ref 0.0–0.5)
Eosinophils Relative: 3 %
HCT: 40.6 % (ref 39.0–52.0)
Hemoglobin: 13.6 g/dL (ref 13.0–17.0)
Immature Granulocytes: 1 %
Lymphocytes Relative: 35 %
Lymphs Abs: 2.1 10*3/uL (ref 0.7–4.0)
MCH: 27.4 pg (ref 26.0–34.0)
MCHC: 33.5 g/dL (ref 30.0–36.0)
MCV: 81.7 fL (ref 80.0–100.0)
Monocytes Absolute: 0.4 10*3/uL (ref 0.1–1.0)
Monocytes Relative: 6 %
Neutro Abs: 3.3 10*3/uL (ref 1.7–7.7)
Neutrophils Relative %: 54 %
Platelets: 192 10*3/uL (ref 150–400)
RBC: 4.97 MIL/uL (ref 4.22–5.81)
RDW: 13.2 % (ref 11.5–15.5)
WBC: 6.1 10*3/uL (ref 4.0–10.5)
nRBC: 0 % (ref 0.0–0.2)

## 2020-06-11 LAB — GLUCOSE, CAPILLARY
Glucose-Capillary: 366 mg/dL — ABNORMAL HIGH (ref 70–99)
Glucose-Capillary: 351 mg/dL — ABNORMAL HIGH (ref 70–99)
Glucose-Capillary: 382 mg/dL — ABNORMAL HIGH (ref 70–99)
Glucose-Capillary: 245 mg/dL — ABNORMAL HIGH (ref 70–99)

## 2020-06-11 LAB — MAGNESIUM: Magnesium: 2.1 mg/dL (ref 1.7–2.4)

## 2020-06-11 LAB — HEMOGLOBIN A1C
Hgb A1c MFr Bld: 11.4 % — ABNORMAL HIGH (ref 4.8–5.6)
Mean Plasma Glucose: 280.48 mg/dL

## 2020-06-11 LAB — PHOSPHORUS: Phosphorus: 4.1 mg/dL (ref 2.5–4.6)

## 2020-06-11 MED ORDER — INSULIN GLARGINE 100 UNIT/ML ~~LOC~~ SOLN
25.0000 [IU] | Freq: Every day | SUBCUTANEOUS | Status: DC
  Administered 2020-06-11: 22:00:00 25 [IU] via SUBCUTANEOUS
  Filled 2020-06-11: qty 0.25

## 2020-06-11 MED ORDER — MORPHINE SULFATE 15 MG PO TABS
15.0000 mg | ORAL_TABLET | Freq: Two times a day (BID) | ORAL | Status: DC | PRN
  Administered 2020-06-11 – 2020-06-12 (×2): 15 mg via ORAL
  Filled 2020-06-11 (×2): qty 1

## 2020-06-11 MED ORDER — CLOPIDOGREL BISULFATE 75 MG PO TABS
75.0000 mg | ORAL_TABLET | Freq: Every day | ORAL | Status: DC
  Administered 2020-06-11 – 2020-06-12 (×2): 75 mg via ORAL
  Filled 2020-06-11 (×2): qty 1

## 2020-06-11 MED ORDER — CHLORHEXIDINE GLUCONATE CLOTH 2 % EX PADS
6.0000 | MEDICATED_PAD | Freq: Every day | CUTANEOUS | Status: DC
  Administered 2020-06-12: 05:00:00 6 via TOPICAL

## 2020-06-11 MED ORDER — ENOXAPARIN SODIUM 40 MG/0.4ML ~~LOC~~ SOLN
40.0000 mg | SUBCUTANEOUS | Status: DC
  Administered 2020-06-11: 21:00:00 40 mg via SUBCUTANEOUS
  Filled 2020-06-11: qty 0.4

## 2020-06-11 MED ORDER — MORPHINE SULFATE ER 15 MG PO TBCR
60.0000 mg | EXTENDED_RELEASE_TABLET | Freq: Two times a day (BID) | ORAL | Status: DC
  Administered 2020-06-11 – 2020-06-12 (×3): 60 mg via ORAL
  Filled 2020-06-11 (×3): qty 4

## 2020-06-11 NOTE — Progress Notes (Signed)
Initial Nutrition Assessment  DOCUMENTATION CODES:   Severe malnutrition in context of acute illness/injury  INTERVENTION:   Reinforce DM diet education, explained to pt/wife weight loss due to hyperglycemia; encouraging compliance with blood sugar checks and insulin administration   NUTRITION DIAGNOSIS:   Severe Malnutrition related to acute illness (uncontrolled DM) as evidenced by moderate muscle depletion,percent weight loss. 7% x 1 month  GOAL:   Patient will meet greater than or equal to 90% of their needs  MONITOR:   PO intake,Labs  REASON FOR ASSESSMENT:   Malnutrition Screening Tool    ASSESSMENT:   Pt with PMH of anxiety, benign essential tremor, bipolar 2 disorder, bladder cancer, CAD, Sz, uncontrolled DM on insulin, bladder cancer receiving active installation chemo admitted with R sided weakness, numbness, dysarthria and CBG >600. Pt dx with CVA and given tPA.   Spoke with pt and wife who was at bedside. Pt reports usual weight of 220 lb about a month ago. Recently went to the doctor and was 204 lb. Pt with 7% weight loss x 1 month. Per pt he has lost weight over the last month without trying. States sometimes he does not eat all day long and some days he cannot eat enough to fill him up. He reports noncompliance with blood sugar checks and insulin administration. 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 Had a good conversation about how hyperglycemia has contributed to his weight loss. Per pt he was told at his doctor's office that his muscle loss in his lower extremities has been due to hyperglycemia. Pt reports that dad, uncles, aunt all suffer with DM related complications.    Meal Completion: 100% for dinner 3/31  Medications reviewed and include: SSI, lantus, vitamin D  Labs reviewed: A1C: 13 (11/21) CBG's: 251-366    NUTRITION - FOCUSED PHYSICAL EXAM:  Flowsheet Row Most Recent Value   Orbital Region No depletion  Upper Arm Region Moderate depletion  Thoracic and Lumbar Region No depletion  Buccal Region No depletion  Temple Region No depletion  Clavicle Bone Region Mild depletion  Clavicle and Acromion Bone Region No depletion  Scapular Bone Region No depletion  Dorsal Hand No depletion  Patellar Region Moderate depletion  Anterior Thigh Region Moderate depletion  Posterior Calf Region Mild depletion  Edema (RD Assessment) None  Hair Reviewed  Eyes Unable to assess  Mouth Reviewed  Skin Reviewed  [wound to R hand between thumb and pointer]  Nails Reviewed       Diet Order:   Diet Order            Diet Carb Modified Fluid consistency: Thin; Room service appropriate? Yes  Diet effective now                 EDUCATION NEEDS:   No education needs have been identified at this time  Skin:  Skin Assessment: Skin Integrity Issues: Skin Integrity Issues:: Diabetic Ulcer Diabetic Ulcer: R/L pretibial  Last BM:  3/31  Height:   Ht Readings from Last 1 Encounters:  06/10/20 6' (1.829 m)    Weight:   Wt Readings from Last 1 Encounters:  06/11/20 94.8 kg    Ideal Body Weight:  80.9 kg  BMI:  Body mass index is 28.34 kg/m.  Estimated Nutritional Needs:   Kcal:  2100-2300  Protein:  110-125 grams  Fluid:  >2 L/day  Lockie Pares., RD, LDN, CNSC See  [facility] for contact information

## 2020-06-11 NOTE — Progress Notes (Signed)
Pt in no acute distress. VSS. Report given to [facility]. Transported via bed.

## 2020-06-11 NOTE — Progress Notes (Signed)
Met with the patient at the bedside, he is able to walk around freely without an AD He has uncontrolled BS He tells me that he knows how to manage his diabetes, he chooses not to use insulin because he does not like being stuck, I explained to him that DM unmanaged can cause a mulititude of issues and even death, he stated understanding and said he knows all about it, he has a glucometer at home and the reader that is attached on the arm, he does not check his blood sugars and he knows he should.  Provided the patient with CM contact information to call if he has needs arise 

## 2020-06-11 NOTE — Progress Notes (Addendum)
Inpatient Diabetes Program Recommendations  AACE/ADA: New Consensus Statement on Inpatient Glycemic Control (2015)  Target Ranges:  Prepandial:   less than 140 mg/dL      Peak postprandial:   less than 180 mg/dL (1-2 hours)      Critically ill patients:  140 - 180 mg/dL   Results for Vincent [name], Vincent [name] (MRN [id number]) as of 06/11/2020 07:57  Ref. Range 06/10/2020 10:45 06/10/2020 14:08 06/10/2020 14:59 06/10/2020 16:06 06/10/2020 17:23 06/10/2020 18:05 06/10/2020 19:17 06/10/2020 21:28  Glucose-Capillary Latest Ref Range: 70 - 99 mg/dL >600 (HH) 403 (H)  IV Insulin Drip started 2:30pm 329 (H) 104 (H) 93  IV Insulin Drip OFF 102 (H) 251 (H) 256 (H)  3 units NOVOLOG  10 units LANTUS   Results for Vincent [name], Vincent [name] (MRN [id number]) as of 06/11/2020 07:57  Ref. Range 06/11/2020 07:36  Glucose-Capillary Latest Ref Range: 70 - 99 mg/dL 366 (H)     To ED with Code Stroke and Severe Hyperglycemia  History: DM, Bladder cancer on chemo therapy   Home DM Meds: Toujeo 180 units Daily        Trulicity 4.5 mg Qweek        Metformin 500 mg BID   ENDO: Dr. Sarita Bottom seen 02/25/2020--No Changes made to meds   Current Orders: Lantus 10 units QHS       Novolog 0-15 units TID ac/hs   Pt was on the IV Insulin rip from 2:30pm to 5:30pm last night--Drip off at 5:30pm--Lantus and Novolog NOT started until 10pm last night    MD- Note CBG 366 this AM.  Pt takes extremely large dose of Basal insulin (Toujeo 180 units Daily) at home--only got 10 units Lantus last PM  Please consider:  1. Increase Lantus to 20 units BID (0.4 units/kg)--Start increased dose this AM  2. Start Novolog Meal Coverage: Novolog 6 units TID with meals Hold if pt eats <50% of meal, Hold if pt NPO  3. Check Current A1c level (last one on file was 13% back in Nov 2021)   Addendum 12:10pm--Met with pt and wife at bedside.  Pt told me he was diagnosed with diabetes at age 59.  Has not been truly consistent with taking insulin over  the years and told me his MD told him he is likely a Type 1 diabetic with some components of Type 2 diabetes.  We discussed the pathophysiology between Type 1 versus Type 2 and we also discussed that pt likely does not make any endogenous insulin (Type 1) but that he also may have some insulin resistance that he has developed over the years (Type 2).  Discussed with pt and wife the extreme importance of good CBG control at home (especially in light of his new CVA) and also how good CBG control can help prevent further complications and improve his overall health.  Pt told me his last A1c was 10.5% (December/January taken at ENDO office).  We discussed how A1c of 7% is a healthier A1c goal and we reviewed goal CBGs.    Pt sporadically checking CBGs.  We discussed his extremely large dose of Toujeo insulin.  Expressed to pt my concern about such a large dose of Basal insulin with no orders for rapid-acting insulin.  Pt told me his ENDO increased the Toujeo to 200 units several months ago and then decreased the Toujeo back to 180 units (according to pt he had extreme hyperglycemia and Dr. Loanne Drilling increased the [facility]).  Pt told me he quit taking  Humalog insulin about 4 months ago and did not tell Dr. Loanne Drilling.  However, I do not see any mention of Humalog (or any other rapid-acting insulin) in pt's office notes with Dr. Loanne Drilling.  I strongly encouraged pt to re-schedule his appt with Dr. Loanne Drilling (missed it on 03/24) and to let Dr. Loanne Drilling know he is not taking the Humalog at present.  Pt expressed to me that he has to eat all day to keep up with the 180 units of Toujeo per day.  I discussed with pt that typically basal insulin when given in a weight-based amount would not cause Hypoglycemia if taken without eating all day and that perhaps pt would do better with a lower dose of basal insulin and a more intensive regimen of Humalog to both correct CBGs and cover carbohydrate intake.  Strongly encouraged pt to discuss  his insulin regimen with Dr. Loanne Drilling and to tell Dr. Loanne Drilling he is willing to take Humalog to help better manage his CBGs at home. Gave pt and wife the opportunity to ask questions--presently they have none.   --Will follow patient during hospitalization--  Wyn Quaker RN, MSN, CDE Diabetes Coordinator Inpatient Glycemic Control Team Team Pager: [phone] (8a-5p)

## 2020-06-11 NOTE — Evaluation (Signed)
Physical Therapy Evaluation Patient Details Name: Vincent [name] MRN: [id number] DOB: [birth year] Today's Date: 06/11/2020   History of Present Illness  55yo male Pt with PMH of anxiety, benign essential tremor, bipolar 2 disorder, bladder cancer, CAD, Sz, uncontrolled DM on insulin, bladder cancer receiving active installation chemo admitted with R sided weakness, numbness, dysarthria and CBG >600. Pt dx with CVA and given tPA. Repeat imaging negative for acute infarct.    Clinical Impression  Pt admitted with above diagnosis. Pt received with OT ending eval and agreeable to PT eval directly after. Noted decreased RLE strength > LLE but is baseline for him due to his peripheral neuropathy.Pt STS and amb ~190' mod-I with no AD and with application of DGI during ambulation. Pt scored 19/24 indicating pt is not at increased risk of falls and was returned to seated in recliner. Pt reports he is ambulating at his baseline function and speed. Pt does not need any acute needs at this time. PT to sign off.    Follow Up Recommendations No PT follow up    Equipment Recommendations  None recommended by PT    Recommendations for Other Services       Precautions / Restrictions Precautions Precautions: Fall Restrictions Weight Bearing Restrictions: No      Mobility  Bed Mobility Overal bed mobility: Modified Independent             General bed mobility comments: Found and returned to recliner    Transfers Overall transfer level: Modified independent                  Ambulation/Gait Ambulation/Gait assistance: Supervision Gait Distance (Feet): 190 Feet   Gait Pattern/deviations: WFL(Within Functional Limits);Decreased stride length     General Gait Details: Pt reports he is 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    Modified Rankin (Stroke Patients Only)       Balance Overall balance assessment: Needs assistance Sitting-balance  support: No upper extremity supported;Feet supported Sitting balance-Leahy Scale: Good       Standing balance-Leahy Scale: Good                   Standardized Balance Assessment Standardized Balance Assessment : Dynamic Gait Index   Dynamic Gait Index Level Surface: Mild Impairment Change in Gait Speed: Normal Gait with Horizontal Head Turns: Normal Gait with Vertical Head Turns: Normal Gait and Pivot Turn: Normal Step Over Obstacle: Mild Impairment Step Around Obstacles: Mild Impairment (able to ambulate around other people and objects in hallways) Steps: Moderate Impairment (clinical judgement- Anticipate step to pattern and need for rails due to chronic RLE weakness) Total Score: 19       Pertinent Vitals/Pain Pain Assessment: No/denies pain    Home Living Family/patient expects to be discharged to:: Private residence Living Arrangements: Spouse/significant other Available Help at Discharge: Family;Available 24 hours/day Type of Home: House Home Access: Stairs to enter Entrance Stairs-Rails: Left (with ascending) Entrance Stairs-Number of Steps: 1 Home Layout: Two level;1/2 bath on main level;Bed/bath upstairs Home Equipment: Cane - single point;Grab bars - tub/shower;Shower seat;Wheelchair - manual;Other (comment) Additional Comments: has rollator    Prior Function Level of Independence: Independent;Needs assistance   Gait / Transfers Assistance Needed: Amb w/o AD, requires assist only for steps to 2nd floor bed/bath with spouse  ADL's / Homemaking Assistance Needed: Mod-I with ADL's  Comments: endorses 6 falls in 12 mo, resulting from stairs in  home; reports attempts at trying to get out of the lease they are in but unable to so far     Hand Dominance   Dominant Hand: Left    Extremity/Trunk Assessment   Upper Extremity Assessment Upper Extremity Assessment: Overall WFL for tasks assessed    Lower Extremity Assessment Lower Extremity Assessment:  Generalized weakness;RLE deficits/detail RLE Deficits / Details: Significant RLE weakness > LLE due to neuropathy. RLE Sensation: decreased light touch;decreased proprioception;history of peripheral neuropathy RLE Coordination: WNL    Cervical / Trunk Assessment Cervical / Trunk Assessment: Normal  Communication   Communication: No difficulties  Cognition Arousal/Alertness: Awake/alert Behavior During Therapy: WFL for tasks assessed/performed Overall Cognitive Status: Within Functional Limits for tasks assessed                                        General Comments General comments (skin integrity, edema, etc.): pt BG 351, RN giving insulin at start of session, cleared pt to work with OT    Exercises Other Exercises Other Exercises: Pt educated in home/work routines/modifications to support improved medication compliance and chronic disease mgt to minimize uncontrolled blood glucose, minimize falls risk, and maximize quality of life.   Assessment/Plan    PT Assessment Patent does not need any further PT services  PT Problem List         PT Treatment Interventions      PT Goals (Current goals can be found in the Care Plan section)  Acute Rehab PT Goals Patient Stated Goal: go home PT Goal Formulation: With patient Time For Goal Achievement: 06/25/20 Potential to Achieve Goals: Good    Frequency     Barriers to discharge        Co-evaluation               AM-PAC PT "6 Clicks" Mobility  Outcome Measure Help needed turning from your back to your side while in a flat bed without using bedrails?: None Help needed moving from lying on your back to sitting on the side of a flat bed without using bedrails?: None Help needed moving to and from a bed to a chair (including a wheelchair)?: None Help needed standing up from a chair using your arms (e.g., wheelchair or bedside chair)?: None Help needed to walk in hospital room?: None Help needed climbing  3-5 steps with a railing? : A Lot 6 Click Score: 22    End of Session Equipment Utilized During Treatment: Gait belt Activity Tolerance: Patient tolerated treatment well Patient left: in chair;with call bell/phone within reach;with chair alarm set   PT Visit Diagnosis: Difficulty in walking, not elsewhere classified (R26.2);History of falling (Z91.81)    Time: 1610-9604 PT Time Calculation (min) (ACUTE ONLY): 13 min   Charges:   PT Evaluation $PT Eval Low Complexity: [place] M. Fairly IV, PT, DPT Physical Therapist- [facility]  06/11/2020, 4:18 PM

## 2020-06-11 NOTE — Evaluation (Signed)
Speech Language Pathology Evaluation Patient Details Name: Vincent [name] MRN: [id number] DOB: 06/14/1965 Today's Date: 06/11/2020 Time: 6606-3016 SLP Time Calculation (min) (ACUTE ONLY): 28 min  Problem List:  Patient Active Problem List   Diagnosis Date Noted  . Ischemic stroke ([place]) 06/10/2020  . Hyperglycemia   . Gangrene of finger of left hand ([place])   . Pain due to onychomycosis of toenails of both feet 01/28/2020  . Hyperglycemia due to type 2 diabetes mellitus ([place]) 01/07/2020  . Resistance to insulin 01/07/2020  . Right leg weakness 04/24/2019  . Right leg pain 04/24/2019  . Gait abnormality 04/24/2019  . TIA (transient ischemic attack) 10/20/2018  . Uncontrolled type 2 DM with hyperosmolar nonketotic hyperglycemia ([place]) 10/19/2018  . Left sided numbness 10/19/2018  . Diabetic autonomic neuropathy associated with type 2 diabetes mellitus ([place]) 02/01/2018  . DM type 2 with diabetic peripheral neuropathy ([place]) 02/01/2018  . Dyslipidemia 01/31/2018  . Coronary artery disease involving native coronary artery of native heart without angina pectoris 01/31/2018  . Chest pain 01/01/2018  . Seizure disorder ([place]) 12/19/2016  . Essential tremor 12/19/2016  . Polypharmacy 12/19/2016  . Chronic post-traumatic stress disorder (PTSD) 06/16/2016  . Tremor 06/16/2016  . Tobacco use disorder 05/05/2016  . Psychophysiological insomnia 06/02/2015  . Altered mental status 05/28/2015  . Acute bronchitis 05/28/2015  . Type 2 diabetes mellitus with hyperglycemia ([place]) 05/28/2015  . Hypothyroidism 05/28/2015  . Bipolar 2 disorder ([place]) 05/28/2015  . History of rheumatoid arthritis 05/28/2015  . Chronic pain 05/28/2015  . Depression 03/18/2015  . Carpal tunnel syndrome on left 02/10/2015  . Carpal tunnel syndrome on right 02/10/2015  . Diabetes ([place]) 01/23/2015  . Obesity (BMI 30-39.9) 03/06/2014  . Acute encephalopathy 03/05/2014  . HLD (hyperlipidemia) 03/05/2014  . Anemia, normocytic  normochromic 03/05/2014  . Altered mental state   . Helicobacter pylori (H. pylori) infection 11/20/2012  . Protein-calorie malnutrition, severe ([place]) 10/09/2012  . Dysphagia 08/19/2012  . Gastroparesis 08/19/2012  . Bladder tumor 08/08/2012  . Biliary dyskinesia 11/02/2010   Past Medical History:  Past Medical History:  Diagnosis Date  . Anxiety   . Benign essential tremor   . Bipolar 2 disorder ([place])    followed by [facility]--- dr s. Adele Schilder  . Bladder cancer (HCC)    recurrent  . CAD (coronary artery disease)    cardiac cath 2003  and 2011 both showed normal coronary arteries w/ preserved lvf;  Non obstructive on CTA Oct 2019.   Marland Kitchen Chronic pain syndrome    back---- [facility] [place]  . Cold extremities    BLE  . COPD (chronic obstructive pulmonary disease) ([place])   . DDD (degenerative disc disease), lumbar   . Diabetic peripheral neuropathy ([place])   . Gastroparesis    followed by dr Henrene Pastor  . GERD (gastroesophageal reflux disease)   . Hiatal hernia   . History of bladder cancer urologist-  previously dr Consuella Lose;  now dr gay   papillay TCC (Ta G1)  s/p TURBT and chemo instillation 2014  . History of chest pain 12/2017   heart cath normal  . History of encephalopathy 05/27/2015   admission w/ acute encephalopathy thought to be secondary to pain meds and COPD  . History of gastric ulcer   . History of Helicobacter pylori infection   . History of kidney stones   . History of TIA (transient ischemic attack) 2008  and 10-19-2018    no residual's  . History of  traumatic head injury 2010   w/ LOC  per pt needed stitches, hit in head with a mower blade  . Hyperlipidemia   . Hypertension   . Hypogonadism male    s/p  bilateral orchiectomy  . Hypothyroidism   . Insomnia   . Mild obstructive sleep apnea    study in epic 12-04-2016, no cpap  . PTSD (post-traumatic stress disorder)    chronic  . PTSD (post-traumatic stress disorder)   . RA (rheumatoid arthritis)  ([place])    followed by [facility]  . Seizures, transient [facility]) neurologist-  dr Krista Blue--  differential dx complex partial seizure .vs.  mood disorder .vs.  pseudoseizure--  negative EEG's   confusion episodes and staring spells since 11/ 2015   (03-26-2020 per pt wife last seizure 10 /2021)  . Transient confusion NEUOROLOGIST-  DR YAN   Episodes since 11/ 2015--  neurologist dx  differential complex partial seizure  .vs. mood disorder . vs. pseudoseizure  . Type 2 diabetes mellitus treated with insulin [facility])    endocrinologist--- dr Loanne Drilling---  (03-26-2020 pt does not check blood sugar at home)   Past Surgical History:  Past Surgical History:  Procedure Laterality Date  . AMPUTATION Left 04/28/2020   Procedure: LEFT LITTLE FINGER AMPUTATION;  Surgeon: Newt Minion, MD;  Location: [place];  Service: Orthopedics;  Laterality: Left;  . CARDIAC CATHETERIZATION  12-27-2001  DR Einar Gip  &  05-26-2009  DR Irish Lack   RESULTS FOR BOTH ARE NORMAL CORONARIES AND PERSERVED LVF/ EF 60%  . CARPAL TUNNEL RELEASE Bilateral right 09-16-2003;  left ?  . CARPAL TUNNEL RELEASE Left 02/25/2015   Procedure: LEFT CARPAL TUNNEL RELEASE;  Surgeon: Leanora Cover, MD;  Location: [place];  Service: Orthopedics;  Laterality: Left;  . CYSTOSCOPY N/A 10/10/2012   Procedure: CYSTOSCOPY CLOT EVACUATION FULGERATION OF BLEEDERS ;  Surgeon: Claybon Jabs, MD;  Location[facility];  Service: Urology;  Laterality: N/A;  . CYSTOSCOPY WITH BIOPSY N/A 11/26/2015   Procedure: CYSTOSCOPY WITH BIOPSY AND FULGURATION;  Surgeon: Kathie Rhodes, MD;  Location: [place];  Service: Urology;  Laterality: N/A;  . ESOPHAGOGASTRODUODENOSCOPY  2014  . LAPAROSCOPIC CHOLECYSTECTOMY  11-17-2010  . ORCHIECTOMY Right 02/21/2016   Procedure: SCROTAL ORCHIECTOMY with TESTICULAR PROSTHESIS IMPLANT;  Surgeon: Kathie Rhodes, MD;  Location: [facility];  Service: Urology;  Laterality:  Right;  . ORCHIECTOMY Left 09/02/2018   Procedure: ORCHIECTOMY;  Surgeon: Kathie Rhodes, MD;  Location: [facility];  Service: Urology;  Laterality: Left;  . ROTATOR CUFF REPAIR Right 12/2004  . TRANSURETHRAL RESECTION OF BLADDER TUMOR N/A 08/09/2012   Procedure: TRANSURETHRAL RESECTION OF BLADDER TUMOR (TURBT) WITH GYRUS WITH MITOMYCIN C;  Surgeon: Claybon Jabs, MD;  Location: [facility];  Service: Urology;  Laterality: N/A;  . TRANSURETHRAL RESECTION OF BLADDER TUMOR N/A 03/29/2020   Procedure: TRANSURETHRAL RESECTION OF BLADDER TUMOR (TURBT) and post-op instillation of gemcitabine;  Surgeon: Janith Lima, MD;  Location: [facility];  Service: Urology;  Laterality: N/A;  . TRANSURETHRAL RESECTION OF BLADDER TUMOR WITH GYRUS (TURBT-GYRUS) N/A 02/27/2014   Procedure: TRANSURETHRAL RESECTION OF BLADDER TUMOR WITH GYRUS (TURBT-GYRUS);  Surgeon: Claybon Jabs, MD;  Location: [facility];  Service: Urology;  Laterality: N/A;   HPI:  55yo male Pt with PMH of anxiety, benign essential tremor, bipolar 2 disorder, bladder cancer, CAD, Sz, uncontrolled DM on insulin, bladder cancer receiving active installation  chemo admitted with R sided weakness, numbness, dysarthria and CBG >600. Pt dx with CVA and given tPA. Repeat imaging negative for acute infarct.   Assessment / Plan / Recommendation Clinical Impression  Pt's wife present during evaluation. Per their report, pt has been experiencing memory loss for ~ 4 weeks that have progressively worsened almost daily. They report that pt is using his GPS to recall how to get ot work, unable to recall information at work, despite multiple daily reminders, pt is not able ot recall info from morning to evening, they feel that these deficits get worse as the day progresses, he also experiences hallucinations in the evening. It is difficult to differentiately diagnose these characteristics as strictly  cognitive in nature given pt's multiple high risk comorbidities.    SLP Assessment  SLP Recommendation/Assessment:  (Pt may seek Outpatient ST services if pt's s/s don't improve) SLP Visit Diagnosis: Cognitive communication deficit (R41.841)    Follow Up Recommendations  None          SLP Evaluation Cognition  Overall Cognitive Status: Impaired/Different from baseline Arousal/Alertness: Awake/alert Orientation Level: Oriented X4 Memory: Impaired Memory Impairment: Storage deficit;Retrieval deficit;Decreased recall of new information;Decreased short term memory Safety/Judgment: Impaired Comments: poor disease management       Comprehension  Auditory Comprehension Overall Auditory Comprehension: Appears within functional limits for tasks assessed Visual Recognition/Discrimination Discrimination: Within Function Limits    Expression Expression Primary Mode of Expression: Verbal Verbal Expression Overall Verbal Expression: Appears within functional limits for tasks assessed Written Expression Dominant Hand: Left Written Expression: Not tested   Oral / Motor  Oral Motor/Sensory Function Overall Oral Motor/Sensory Function: Within functional limits Motor Speech Overall Motor Speech: Appears within functional limits for tasks assessed   GO                   Lamekia Nolden B. Rutherford Nail M.S., CCC-SLP, [place] Office [phone]  Stormy Fabian 06/11/2020, 8:18 PM

## 2020-06-11 NOTE — Progress Notes (Signed)
*  PRELIMINARY RESULTS* Echocardiogram 2D Echocardiogram has been performed.  Vincent [name] 06/11/2020, 10:16 AM

## 2020-06-11 NOTE — Progress Notes (Signed)
SLP Cancellation Note  Patient Details Name: Vincent [name] MRN: [id number] DOB: June 24, 1965   Cancelled treatment:       Reason Eval/Treat Not Completed: Patient at procedure or test/unavailable  Pt has transferred out of ICU, but is currently off unit for testing. Will continue efforts to complete cognitive/communication eval.  Florence Antonelli B. Quentin Ore, [facility], [place] Speech Language Pathologist  Shonna Chock 06/11/2020, 11:22 AM

## 2020-06-11 NOTE — Telephone Encounter (Signed)
PA for [place] submitted via CoverMyMeds. Awaiting determination. FYI pt currently in hospital r/t stroke or TIA.

## 2020-06-11 NOTE — Progress Notes (Addendum)
Progress Note    Vincent [name]  EXN:[id number] DOB: 1965-11-07  DOA: 06/10/2020 PCP: Hayden Rasmussen, MD      Brief Narrative:    Medical records reviewed and are as summarized below:  Vincent [name] is a 55 y.o. male       Assessment/Plan:   Active Problems:   Ischemic stroke ([place])   Hyperglycemia   Nutrition Problem: Severe Malnutrition Etiology: acute illness (uncontrolled DM)  Signs/Symptoms: moderate muscle depletion,percent weight loss Percent weight loss: 7 %   Body mass index is 28.34 kg/m.   Acute stroke, history of TIA: S/p TPA on 06/11/2018 2022.  No structural evidence of stroke on CT head or MRI brain.  He has been started on Plavix.  Continue Crestor.  Follow-up with neurologist for further recommendations.  Type II DM with severe hyperglycemia: Continue Lantus and Humalog while inpatient.  Patient is not interested in long-term insulin therapy.  Hemoglobin A1c is 11.4.  Glucose levels closely.  History of bladder cancer: Outpatient follow-up with oncologist  Hyponatremia and AKI: Resolved  Other comorbidities include hypertension, hyperlipidemia, COPD, bipolar disorder, PTSD, history of traumatic brain injury, questionable seizure disorder, hypogonadism, rheumatoid arthritis   Diet Order            Diet Carb Modified Fluid consistency: Thin; Room service appropriate? Yes  Diet effective now                    Consultants:  Neurologist  Procedures:  None    Medications:   . [START ON 06/12/2020] Chlorhexidine Gluconate Cloth  6 each Topical Daily  . clopidogrel  75 mg Oral Daily  . enoxaparin (LOVENOX) injection  40 mg Subcutaneous Q24H  . fenofibrate  160 mg Oral Daily  . gabapentin  600 mg Oral BID  . insulin aspart  0-15 Units Subcutaneous TID WC  . insulin aspart  0-5 Units Subcutaneous QHS  . insulin glargine  10 Units Subcutaneous QHS  . lamoTRIgine  150 mg Oral BID  . levothyroxine  25 mcg Oral QAC breakfast   . morphine  60 mg Oral BID  . mupirocin ointment  1 application Nasal BID  . pantoprazole (PROTONIX) IV  40 mg Intravenous QHS  . pregabalin  200 mg Oral q morning   And  . pregabalin  400 mg Oral QHS  . rosuvastatin  10 mg Oral Daily  . venlafaxine XR  150 mg Oral Q breakfast  . [START ON 06/16/2020] Vitamin D (Ergocalciferol)  50,000 Units Oral Q Wed   Continuous Infusions:   Anti-infectives (From admission, onward)   None             Family Communication/Anticipated D/C date and plan/Code Status   DVT prophylaxis: enoxaparin (LOVENOX) injection 40 mg Start: 06/11/20 2200 Place and maintain sequential compression device Start: 06/10/20 1508 SCD's Start: 06/10/20 1153     Code Status: Full Code  Family Communication: Discussed with his wife at the bedside Disposition Plan:    Status is: Inpatient  Remains inpatient appropriate because:Inpatient level of care appropriate due to severity of illness   Dispo: The patient is from: Home              Anticipated d/c is to: Home              Patient currently is not medically stable to d/c.   Difficult to place patient No  Subjective:   Interval events noted.  No complaints.  Right-sided weakness and slurred speech have resolved.  Objective:    Vitals:   06/11/20 0600 06/11/20 0800 06/11/20 0900 06/11/20 1000  BP: 134/77 (!) 138/55 138/80 117/62  Pulse: 73 63 66 75  Resp: 13 14 11 11   Temp:  98.1 F (36.7 C)    TempSrc:  Oral    SpO2: 95% 93% 97% 96%  Weight:      Height:       No data found.   Intake/Output Summary (Last 24 hours) at 06/11/2020 1641 Last data filed at 06/11/2020 1410 Gross per 24 hour  Intake 1658.16 ml  Output 1150 ml  Net 508.16 ml   Filed Weights   06/10/20 1508 06/11/20 0500  Weight: 92.5 kg 94.8 kg    Exam:  GEN: NAD SKIN: Chronic wound on the dorsal area between the thumb and the index finger. EYES: EOMI, PERRLA ENT: MMM CV: RRR PULM: CTA B ABD:  soft, ND, NT, +BS CNS: AAO x 3, non focal EXT: No edema or tenderness        Data Reviewed:   I have personally reviewed following labs and imaging studies:  Labs: Labs show the following:   Basic Metabolic Panel: Recent Labs  Lab 06/10/20 1050 06/11/20 0559  NA 129* 136  K 4.8 4.0  CL 97* 105  CO2 21* 21*  GLUCOSE 694* 373*  BUN 30* 25*  CREATININE 1.60* 1.11  CALCIUM 8.7* 8.8*  MG  --  2.1  PHOS  --  4.1   GFR Estimated Creatinine Clearance: 93 mL/min (by C-G formula based on SCr of 1.11 mg/dL). Liver Function Tests: Recent Labs  Lab 06/10/20 1050  AST 19  ALT 16  ALKPHOS 81  BILITOT 0.6  PROT 6.3*  ALBUMIN 3.5   No results for input(s): LIPASE, AMYLASE in the last 168 hours. No results for input(s): AMMONIA in the last 168 hours. Coagulation profile Recent Labs  Lab 06/10/20 1050  INR 1.1    CBC: Recent Labs  Lab 06/10/20 1050 06/11/20 0559  WBC 5.9 6.1  NEUTROABS 3.5 3.3  HGB 12.7* 13.6  HCT 37.1* 40.6  MCV 79.8* 81.7  PLT 191 192   Cardiac Enzymes: No results for input(s): CKTOTAL, CKMB, CKMBINDEX, TROPONINI in the last 168 hours. BNP (last 3 results) No results for input(s): PROBNP in the last 8760 hours. CBG: Recent Labs  Lab 06/10/20 1805 06/10/20 1917 06/10/20 2128 06/11/20 0736 06/11/20 1419  GLUCAP 102* 251* 256* 366* 351*   D-Dimer: No results for input(s): DDIMER in the last 72 hours. Hgb A1c: Recent Labs    06/11/20 0559  HGBA1C 11.4*   Lipid Profile: Recent Labs    06/11/20 0559  CHOL 172  HDL 36*  LDLCALC 59  TRIG 386*  CHOLHDL 4.8   Thyroid function studies: No results for input(s): TSH, T4TOTAL, T3FREE, THYROIDAB in the last 72 hours.  Invalid input(s): FREET3 Anemia work up: No results for input(s): VITAMINB12, FOLATE, FERRITIN, TIBC, IRON, RETICCTPCT in the last 72 hours. Sepsis Labs: Recent Labs  Lab 06/10/20 1050 06/11/20 0559  WBC 5.9 6.1    Microbiology Recent Results (from the  past 240 hour(s))  Resp Panel by RT-PCR (Flu A&B, Covid) Nasopharyngeal Swab     Status: None   Collection Time: 06/10/20 10:50 AM   Specimen: Nasopharyngeal Swab; Nasopharyngeal(NP) swabs in vial transport medium  Result Value Ref Range Status   SARS Coronavirus 2 by RT  PCR NEGATIVE NEGATIVE Final    Comment: (NOTE) SARS-CoV-2 target nucleic acids are NOT DETECTED.  The SARS-CoV-2 RNA is generally detectable in upper respiratory specimens during the acute phase of infection. The lowest concentration of SARS-CoV-2 viral copies this assay can detect is 138 copies/mL. A negative result does not preclude SARS-Cov-2 infection and should not be used as the sole basis for treatment or other patient management decisions. A negative result may occur with  improper specimen collection/handling, submission of specimen other than nasopharyngeal swab, presence of viral mutation(s) within the areas targeted by this assay, and inadequate number of viral copies(<138 copies/mL). A negative result must be combined with clinical observations, patient history, and epidemiological information. The expected result is Negative.  Fact Sheet for Patients:  EntrepreneurPulse.com.au  Fact Sheet for Healthcare Providers:  IncredibleEmployment.be  This test is no t yet approved or cleared by the [place] FDA and  has been authorized for detection and/or diagnosis of SARS-CoV-2 by FDA under an Emergency Use Authorization (EUA). This EUA will remain  in effect (meaning this test can be used) for the duration of the COVID-19 declaration under Section 564(b)(1) of the Act, 21 U.S.C.section 360bbb-3(b)(1), unless the authorization is terminated  or revoked sooner.       Influenza A by PCR NEGATIVE NEGATIVE Final   Influenza B by PCR NEGATIVE NEGATIVE Final    Comment: (NOTE) The Xpert Xpress SARS-CoV-2/FLU/RSV plus assay is intended as an aid in the diagnosis of  influenza from Nasopharyngeal swab specimens and should not be used as a sole basis for treatment. Nasal washings and aspirates are unacceptable for Xpert Xpress SARS-CoV-2/FLU/RSV testing.  Fact Sheet for Patients: EntrepreneurPulse.com.au  Fact Sheet for Healthcare Providers: IncredibleEmployment.be  This test is not yet approved or cleared by the [place] FDA and has been authorized for detection and/or diagnosis of SARS-CoV-2 by FDA under an Emergency Use Authorization (EUA). This EUA will remain in effect (meaning this test can be used) for the duration of the COVID-19 declaration under Section 564(b)(1) of the Act, 21 U.S.C. section 360bbb-3(b)(1), unless the authorization is terminated or revoked.  Performed at [facility], [address] [place], [place] [address]   MRSA PCR Screening     Status: None   Collection Time: 06/10/20  2:30 PM   Specimen: Nasopharyngeal  Result Value Ref Range Status   MRSA by PCR NEGATIVE NEGATIVE Final    Comment:        The GeneXpert MRSA Assay (FDA approved for NASAL specimens only), is one component of a comprehensive MRSA colonization surveillance program. It is not intended to diagnose MRSA infection nor to guide or monitor treatment for MRSA infections. Performed at [facility], [address] [place], [place] [address]     Procedures and diagnostic studies:  CT HEAD WO CONTRAST  Result Date: 06/11/2020 CLINICAL DATA:  Stroke, follow-up. Additional history provided: 24 hour follow-up CT scan post tPA/CVA. EXAM: CT HEAD WITHOUT CONTRAST TECHNIQUE: Contiguous axial images were obtained from the base of the skull through the vertex without intravenous contrast. COMPARISON:  Same-day brain MRI 06/11/2020. Noncontrast head CT and CT angiogram head/neck 06/10/2020. FINDINGS: Brain: Cerebral volume is normal for age. There is no acute intracranial hemorrhage. No demarcated  cortical infarct. No extra-axial fluid collection. No evidence of intracranial mass. No midline shift. Vascular: No hyperdense vessel.  Atherosclerotic calcifications. Skull: Normal. Negative for fracture or focal lesion. Sinuses/Orbits: Visualized orbits show no acute finding. Small mucous retention cyst within  a posterior right ethmoid air cell. Background trace bilateral ethmoid sinus mucosal thickening. IMPRESSION: Stable non-contrast CT appearance of the brain. No evidence of acute intracranial abnormality. Electronically Signed   By: Kellie Simmering DO   On: 06/11/2020 11:41   MR BRAIN WO CONTRAST  Result Date: 06/11/2020 CLINICAL DATA:  Provided history: Stroke, follow-up. EXAM: MRI HEAD WITHOUT CONTRAST TECHNIQUE: Multiplanar, multiecho pulse sequences of the brain and surrounding structures were obtained without intravenous contrast. COMPARISON:  Same day head CT 06/11/2020. Noncontrast head CT and CT angiogram head/neck 06/10/2020. Brain MRI 10/20/2018. FINDINGS: Brain: Cerebral volume is normal for age. No cortical encephalomalacia is identified. No significant white matter disease. Redemonstrated subcentimeter chronic infarct within the inferior right cerebellum (series 10, image 4) (series 17, image 8). There is no acute infarct. No evidence of intracranial mass. No chronic intracranial blood products. No extra-axial fluid collection. No midline shift. Vascular: Expected proximal arterial flow voids. Skull and upper cervical spine: No focal marrow lesion. Sinuses/Orbits: Visualized orbits show no acute finding. Trace bilateral ethmoid and maxillary sinus mucosal thickening. Superimposed 13 mm left maxillary sinus mucous retention cyst. IMPRESSION: No evidence of acute intracranial abnormality, including acute infarction. Redemonstrated chronic subcentimeter infarct within the right cerebellar hemisphere. Otherwise unremarkable noncontrast MRI appearance of the brain. Mild paranasal sinus disease as  described. Electronically Signed   By: Kellie Simmering DO   On: 06/11/2020 11:51   ECHOCARDIOGRAM COMPLETE  Result Date: 06/11/2020    ECHOCARDIOGRAM REPORT   Patient Name:   ELIJAH PHOMMACHANH [facility] Date of Exam: 06/11/2020 Medical Rec #:  [id number]       Height:       72.0 in Accession #:    [id number]      Weight:       209.0 lb Date of Birth:  24-Mar-1965        BSA:          2.171 m Patient Age:    77 years        BP:           138/55 mmHg Patient Gender: M               HR:           65 bpm. Exam Location:  [facility] Procedure: 2D Echo, Color Doppler and Cardiac Doppler Indications:     J00.93 CHF-Acute Systolic  History:         Patient has prior history of Echocardiogram examinations, most                  recent 10/20/2018. CAD; Risk Factors:Diabetes, Hypertension and                  Dyslipidemia.  Sonographer:     Charmayne Sheer RDCS (AE) Referring Phys:  [id number] Flora Lipps Diagnosing Phys: Ida Rogue MD  Sonographer Comments: Suboptimal parasternal window. Global longitudinal strain was attempted. IMPRESSIONS  1. Left ventricular ejection fraction, by estimation, is 60 to 65%. The left ventricle has normal function. The left ventricle has no regional wall motion abnormalities. There is mild left ventricular hypertrophy. Left ventricular diastolic parameters are consistent with Grade II diastolic dysfunction (pseudonormalization). The average left ventricular global longitudinal strain is -16.5 %. The global longitudinal strain is normal.  2. Right ventricular systolic function is normal. The right ventricular size is normal. Tricuspid regurgitation signal is inadequate for assessing PA pressure.  3. Challenging image quality FINDINGS  Left Ventricle: Left ventricular ejection fraction, by estimation,  is 60 to 65%. The left ventricle has normal function. The left ventricle has no regional wall motion abnormalities. The average left ventricular global longitudinal strain is -16.5 %. The global longitudinal strain is normal.  The left ventricular internal cavity size was normal in size. There is mild left ventricular hypertrophy. Left ventricular diastolic parameters are consistent with Grade II diastolic dysfunction (pseudonormalization). Right Ventricle: The right ventricular size is normal. No increase in right ventricular wall thickness. Right ventricular systolic function is normal. Tricuspid regurgitation signal is inadequate for assessing PA pressure. Left Atrium: Left atrial size was normal in size. Right Atrium: Right atrial size was normal in size. Pericardium: There is no evidence of pericardial effusion. Mitral Valve: The mitral valve is normal in structure. No evidence of mitral valve regurgitation. No evidence of mitral valve stenosis. MV peak gradient, 2.0 mmHg. The mean mitral valve gradient is 1.0 mmHg. Tricuspid Valve: The tricuspid valve is normal in structure. Tricuspid valve regurgitation is not demonstrated. No evidence of tricuspid stenosis. Aortic Valve: The aortic valve was not well visualized. Aortic valve regurgitation is not visualized. No aortic stenosis is present. Aortic valve mean gradient measures 5.0 mmHg. Aortic valve peak gradient measures 8.2 mmHg. Aortic valve area, by VTI measures 2.92 cm. Pulmonic Valve: The pulmonic valve was normal in structure. Pulmonic valve regurgitation is not visualized. No evidence of pulmonic stenosis. Aorta: The aortic root is normal in size and structure. Venous: The inferior vena cava is normal in size with greater than 50% respiratory variability, suggesting right atrial pressure of 3 mmHg. IAS/Shunts: No atrial level shunt detected by color flow Doppler.  LEFT VENTRICLE PLAX 2D LVIDd:         4.20 cm  Diastology LVIDs:         2.90 cm  LV e' medial:    8.16 cm/s LV PW:         1.10 cm  LV E/e' medial:  8.0 LV IVS:        0.70 cm  LV e' lateral:   15.00 cm/s LVOT diam:     2.40 cm  LV E/e' lateral: 4.3 LV SV:         80 LV SV Index:   37       2D Longitudinal Strain  LVOT Area:     4.52 cm 2D Strain GLS Avg:     -16.5 %  RIGHT VENTRICLE RV Basal diam:  2.80 cm TAPSE (M-mode): 2.8 cm LEFT ATRIUM             Index       RIGHT ATRIUM           Index LA diam:        3.10 cm 1.43 cm/m  RA Area:     13.30 cm LA Vol (A2C):   48.9 ml 22.53 ml/m RA Volume:   28.20 ml  12.99 ml/m LA Vol (A4C):   22.2 ml 10.23 ml/m LA Biplane Vol: 35.4 ml 16.31 ml/m  AORTIC VALVE                    PULMONIC VALVE AV Area (Vmax):    2.91 cm     PV Vmax:       0.90 m/s AV Area (Vmean):   2.68 cm     PV Vmean:      67.600 cm/s AV Area (VTI):     2.92 cm     PV VTI:  0.191 m AV Vmax:           143.00 cm/s  PV Peak grad:  3.2 mmHg AV Vmean:          104.000 cm/s PV Mean grad:  2.0 mmHg AV VTI:            0.273 m AV Peak Grad:      8.2 mmHg AV Mean Grad:      5.0 mmHg LVOT Vmax:         92.00 cm/s LVOT Vmean:        61.700 cm/s LVOT VTI:          0.176 m LVOT/AV VTI ratio: 0.64  AORTA Ao Root diam: 3.70 cm MITRAL VALVE MV Area (PHT): 2.90 cm    SHUNTS MV Area VTI:   3.84 cm    Systemic VTI:  0.18 m MV Peak grad:  2.0 mmHg    Systemic Diam: 2.40 cm MV Mean grad:  1.0 mmHg MV Vmax:       0.71 m/s MV Vmean:      47.6 cm/s MV Decel Time: 262 msec MV E velocity: 65.10 cm/s MV A velocity: 47.60 cm/s MV E/A ratio:  1.37 Ida Rogue MD Electronically signed by Ida Rogue MD Signature Date/Time: 06/11/2020/4:23:46 PM    Final    CT Angio Chest/Abd/Pel for Dissection W and/or Wo Contrast  Result Date: 06/10/2020 CLINICAL DATA:  Rule out aortic dissection. EXAM: CT ANGIOGRAPHY CHEST, ABDOMEN AND PELVIS TECHNIQUE: Non-contrast CT of the chest was initially obtained. Multidetector CT imaging through the chest, abdomen and pelvis was performed using the standard protocol during bolus administration of intravenous contrast. Multiplanar reconstructed images and MIPs were obtained and reviewed to evaluate the vascular anatomy. CONTRAST:  29mL OMNIPAQUE IOHEXOL 350 MG/ML SOLN COMPARISON:  CT angiography  chest from 10/19/18 FINDINGS: CTA CHEST FINDINGS Cardiovascular: Preferential opacification of the thoracic aorta. No evidence of thoracic aortic aneurysm or dissection. Aortic atherosclerosis. Coronary artery calcifications. Normal heart size. No pericardial effusion. Mediastinum/Nodes: No enlarged mediastinal, hilar, or axillary lymph nodes. Thyroid gland, trachea, and esophagus demonstrate no significant findings. Lungs/Pleura: No pleural effusion. No airspace consolidation, atelectasis, or pneumothorax. No suspicious pulmonary nodule or mass. Musculoskeletal: No chest wall abnormality. No acute or significant osseous findings. Review of the MIP images confirms the above findings. CTA ABDOMEN AND PELVIS FINDINGS VASCULAR Aorta: Normal caliber aorta without aneurysm, dissection, vasculitis or significant stenosis. Aortic atherosclerosis. Celiac: Patent without evidence of aneurysm, dissection, vasculitis or significant stenosis. SMA: Patent without evidence of aneurysm, dissection, vasculitis or significant stenosis. Renals: Normal appearance of the left renal artery without signs of stenosis. Calcifications at the origin of the right renal artery is identified with approximately 40% luminal stenosis. IMA: Patent without evidence of aneurysm, dissection, vasculitis or significant stenosis. Inflow: Patent without evidence of aneurysm, dissection, vasculitis or significant stenosis. Veins: No obvious venous abnormality within the limitations of this arterial phase study. Review of the MIP images confirms the above findings. NON-VASCULAR Hepatobiliary: No focal liver abnormality is seen. Status post cholecystectomy. No biliary dilatation. Pancreas: Unremarkable. No pancreatic ductal dilatation or surrounding inflammatory changes. Spleen: Normal in size without focal abnormality. Adrenals/Urinary Tract: Normal appearance of the adrenal glands. No kidney mass or signs of hydronephrosis. Gas noted within the lumen of the  bladder which may reflect recent instrumentation. No focal bladder abnormality noted. Stomach/Bowel: The stomach appears nondistended. No dilated loops of large or small bowel. No bowel wall thickening, inflammation or distension. Lymphatic: No adenopathy  within the abdomen or pelvis. Reproductive: Prostate is unremarkable. Other: No free fluid or fluid collections. Musculoskeletal: No acute or significant osseous findings. Review of the MIP images confirms the above findings. IMPRESSION: 1. No findings to suggest aortic dissection. No explanation for patient's chest pain. 2.  Aortic Atherosclerosis (ICD10-I70.0). 3. Coronary artery calcifications 4. Calcification of the proximal right renal artery with approximately 40% luminal stenosis. 5. These results were called by telephone at the time of interpretation on 06/10/2020 at 11:56 am to provider Dr. Quinn Axe at pager [phone], who verbally acknowledged these results. Electronically Signed   By: Kerby Moors M.D.   On: 06/10/2020 11:57   CT HEAD CODE STROKE WO CONTRAST  Result Date: 06/10/2020 CLINICAL DATA:  Code stroke. 55 year old male with right extremity weakness. EXAM: CT HEAD WITHOUT CONTRAST TECHNIQUE: Contiguous axial images were obtained from the base of the skull through the vertex without intravenous contrast. COMPARISON:  Brain MRI 10/20/2018. CTA head and neck 10/19/2018 and earlier. FINDINGS: Brain: No midline shift, ventriculomegaly, mass effect, evidence of mass lesion, intracranial hemorrhage or evidence of cortically based acute infarction. Gray-white matter differentiation is within normal limits throughout the brain. Vascular: Calcified atherosclerosis at the skull base. No suspicious intracranial vascular hyperdensity. Skull: No acute osseous abnormality identified. Sinuses/Orbits: Visualized paranasal sinuses and mastoids are stable and well pneumatized. Other: Visualized orbits and scalp soft tissues are within normal limits. ASPECTS  [place] [facility] Stroke Program Early CT Score) Total score (0-10 with 10 being normal): 10 IMPRESSION: 1. Stable and normal noncontrast CT appearance of the brain. ASPECTS 10. 2. These results were communicated to Dr. Concepcion Living. At 11:06 am on 06/10/2020 by text page via the [facility] messaging system. Electronically Signed   By: Genevie Ann M.D.   On: 06/10/2020 11:07   CT ANGIO HEAD CODE STROKE  Result Date: 06/10/2020 CLINICAL DATA:  55 year old male code stroke presentation. EXAM: CT ANGIOGRAPHY HEAD AND NECK TECHNIQUE: Multidetector CT imaging of the head and neck was performed using the standard protocol during bolus administration of intravenous contrast. Multiplanar CT image reconstructions and MIPs were obtained to evaluate the vascular anatomy. Carotid stenosis measurements (when applicable) are obtained utilizing NASCET criteria, using the distal internal carotid diameter as the denominator. CONTRAST:  58mL OMNIPAQUE IOHEXOL 350 MG/ML SOLN COMPARISON:  Head CT without contrast 1051 hours today. Prior CTA head and neck 10/19/2018, brain MRI 10/20/2018. FINDINGS: CTA NECK Skeleton: Absent dentition. Chronic posterior element ankylosis at C3-C4 on the left. No acute osseous abnormality identified. Upper chest: Stable chronic increased interstitial markings in the upper lungs. Negative superior mediastinum. Other neck: Negative. Aortic arch: Calcified aortic atherosclerosis. 3 vessel arch configuration. Right carotid system: Mild brachiocephalic artery and right CCA origin plaque is stable without stenosis. Intermittent soft and calcified plaque in the right CCA before the bifurcation without stenosis. Soft and calcified plaque at the bifurcation and bulky calcified plaque at the right ICA bulb has not significantly changed from 2020. Up to 50% stenosis occurs. Left carotid system: Stable intermittent soft and calcified plaque without hemodynamically significant stenosis. Vertebral arteries: Stable plaque in the proximal  right subclavian artery without stenosis. Normal right vertebral artery origin. Right V1 plaque with only mild stenosis. Intermittent right V2 plaque with only mild stenosis to the skull base. Mild plaque in the proximal left subclavian artery and the non dominant left vertebral artery without significant stenosis. CTA HEAD Posterior circulation: Patent distal vertebral arteries, the right is mildly dominant. Mild bilateral V4 segment plaque without significant stenosis.  Normal PICA origins. Patent vertebrobasilar junction. Patent somewhat diminutive basilar artery with chronic irregularity but no significant stenosis. SCA and PCA origins remain patent. Both posterior communicating arteries are present. Bilateral PCA branches are stable and within normal limits. Anterior circulation: Both ICA siphons are patent. Mild to moderate bilateral siphon calcified plaque is present without significant stenosis. Normal posterior communicating artery origins. Patent carotid termini. Patent MCA and ACA origins. Normal anterior communicating artery. Median artery of the corpus callosum (normal variant). ACA branches are stable and within normal limits. MCA M1 segments and bifurcations are stable and patent without stenosis. The left MCA bifurcates early. Bilateral MCA branches appear stable since 2020. No branch occlusion identified. Venous sinuses: Patent. Anatomic variants: Mildly dominant right vertebral artery. Median artery of the corpus callosum. Review of the MIP images confirms the above findings IMPRESSION: 1. Negative for large vessel occlusion. 2. Widespread atherosclerosis in the head and neck, not significantly changed from a 2020 CTA. Up to 50% proximal right ICA stenosis. No high-grade or other hemodynamically significant stenosis identified. 3. Aortic Atherosclerosis (ICD10-I70.0). Electronically Signed   By: Genevie Ann M.D.   On: 06/10/2020 11:17   CT ANGIO NECK CODE STROKE  Result Date: 06/10/2020 CLINICAL  DATA:  55 year old male code stroke presentation. EXAM: CT ANGIOGRAPHY HEAD AND NECK TECHNIQUE: Multidetector CT imaging of the head and neck was performed using the standard protocol during bolus administration of intravenous contrast. Multiplanar CT image reconstructions and MIPs were obtained to evaluate the vascular anatomy. Carotid stenosis measurements (when applicable) are obtained utilizing NASCET criteria, using the distal internal carotid diameter as the denominator. CONTRAST:  30mL OMNIPAQUE IOHEXOL 350 MG/ML SOLN COMPARISON:  Head CT without contrast 1051 hours today. Prior CTA head and neck 10/19/2018, brain MRI 10/20/2018. FINDINGS: CTA NECK Skeleton: Absent dentition. Chronic posterior element ankylosis at C3-C4 on the left. No acute osseous abnormality identified. Upper chest: Stable chronic increased interstitial markings in the upper lungs. Negative superior mediastinum. Other neck: Negative. Aortic arch: Calcified aortic atherosclerosis. 3 vessel arch configuration. Right carotid system: Mild brachiocephalic artery and right CCA origin plaque is stable without stenosis. Intermittent soft and calcified plaque in the right CCA before the bifurcation without stenosis. Soft and calcified plaque at the bifurcation and bulky calcified plaque at the right ICA bulb has not significantly changed from 2020. Up to 50% stenosis occurs. Left carotid system: Stable intermittent soft and calcified plaque without hemodynamically significant stenosis. Vertebral arteries: Stable plaque in the proximal right subclavian artery without stenosis. Normal right vertebral artery origin. Right V1 plaque with only mild stenosis. Intermittent right V2 plaque with only mild stenosis to the skull base. Mild plaque in the proximal left subclavian artery and the non dominant left vertebral artery without significant stenosis. CTA HEAD Posterior circulation: Patent distal vertebral arteries, the right is mildly dominant. Mild  bilateral V4 segment plaque without significant stenosis. Normal PICA origins. Patent vertebrobasilar junction. Patent somewhat diminutive basilar artery with chronic irregularity but no significant stenosis. SCA and PCA origins remain patent. Both posterior communicating arteries are present. Bilateral PCA branches are stable and within normal limits. Anterior circulation: Both ICA siphons are patent. Mild to moderate bilateral siphon calcified plaque is present without significant stenosis. Normal posterior communicating artery origins. Patent carotid termini. Patent MCA and ACA origins. Normal anterior communicating artery. Median artery of the corpus callosum (normal variant). ACA branches are stable and within normal limits. MCA M1 segments and bifurcations are stable and patent without stenosis. The left MCA bifurcates  early. Bilateral MCA branches appear stable since 2020. No branch occlusion identified. Venous sinuses: Patent. Anatomic variants: Mildly dominant right vertebral artery. Median artery of the corpus callosum. Review of the MIP images confirms the above findings IMPRESSION: 1. Negative for large vessel occlusion. 2. Widespread atherosclerosis in the head and neck, not significantly changed from a 2020 CTA. Up to 50% proximal right ICA stenosis. No high-grade or other hemodynamically significant stenosis identified. 3. Aortic Atherosclerosis (ICD10-I70.0). Electronically Signed   By: Genevie Ann M.D.   On: 06/10/2020 11:17               LOS: 1 day   Kayani Rapaport  [facility]   Pager on [email or web address]. If 7PM-7AM, please contact night-coverage at [email or web address]     06/11/2020, 4:41 PM

## 2020-06-11 NOTE — Progress Notes (Signed)
PT Cancellation Note  Patient Details Name: Vincent [name] MRN: [id number] DOB: 09-08-1965   Cancelled Treatment:    Reason Eval/Treat Not Completed: Other (comment);Medical issues which prohibited therapy. Consult received and chart reviewed.  Patient admitted to ICU for CVA work up with tPA infusion (ending at 1247, 06/10/20).  Per guidelines, to be on strict bedrest x24 hours post infusion and follow-up imaging.  Will continue to follow and initiate as medically appropriate.   Salem Caster. Fairly IV, PT, DPT Physical Therapist- [facility]  06/11/2020, 10:09 AM

## 2020-06-11 NOTE — Telephone Encounter (Signed)
PA for Latuda denied per [employer]. Anette Guarneri is only approved after trying and failing Zyprexa, Geodon, Seroquel, and Risperdal per pt insurance. [employer].

## 2020-06-11 NOTE — Progress Notes (Addendum)
Neurology progress note  Patient summary: Patient is a 55 yo man with hx uncontrolled DM2, hx of bladder cancer receiving active installation chrmotherapy, remote TBI 2010, spells who was BIB EMS as stroke code on 06/10/20 for acute onset R sided weakness and numbness (face/arm/leg) and dysarthria. LKW 0945. Patient also reported shortness of breath, lightheadedness with the event. SBP 110s-120s throughout. Accucheck read high (>600 unable to calculate BG). NIHSS on arrival = 4 for facial droop, RLE drift, sensory deficit, and dysarthria. tPA was administered within 2 hours of LKW but DTN was >60 min 2/2 delay necessary to r/o dissection after patient reported chest pain. No intervention indicated 2/2 no LVO.  Subjective: - S/p tPA yesterday, awaiting repeat brain imaging - Sx resolved after tPA administration - NIHSS = 0 this AM with no new complaints - On insulin gtt overnight now changed to subq  Interval data:  MRI brain  No evidence of acute intracranial abnormality, including acute infarction.  Redemonstrated chronic subcentimeter infarct within the right cerebellar hemisphere.  CNS imaging personally reviewed  Exam: Vitals:   06/11/20 0900 06/11/20 1000  BP: 138/80 117/62  Pulse: 66 75  Resp: 11 11  Temp:    SpO2: 97% 96%   Physical Exam Gen: A&O x4, NAD HEENT: Atraumatic, normocephalic;mucous membranes moist; oropharynx clear, tongue without atrophy or fasciculations. Neck: Supple, trachea midline. Resp: CTAB, no w/r/r CV: RRR, no m/g/r; nml S1 and S2. 2+ symmetric peripheral pulses. Abd: soft/NT/ND; nabs x 4 quad Extrem: Nml bulk; no cyanosis, clubbing, or edema.  Neuro: *MS: A&O x4. Follows multi-step commands.  *Speech: fluent, nondysarthric. No aphasia. Naming and repetition intact.  *CN:    I: Deferred   II,III: PERRLA, VFF by confrontation, optic discs sharp   III,IV,VI: EOMI w/o nystagmus, no ptosis   V: Sensation intact from V1 to V3 to LT   VII:  Eyelid closure was full.  Smile symmetric.   VIII: Hearing intact to voice   IX,X: Voice normal, palate elevates symmetrically    XI: SCM/trap 5/5 bilat   XII: Tongue protrudes midline, no atrophy or fasciculations   *Motor:   Normal bulk.  No tremor, rigidity or bradykinesia. No pronator drift.    Strength: Dlt Bic Tri WrE WrF FgS Gr HF KnF KnE PlF DoF    Left 5 5 5 5 5 5 5 5 5 5 5 5     Right 5 5 5 5 5 5 5 5 5 5 5 5     *Sensory: Intact to light touch, pinprick, temperature vibration throughout. Symmetric. Propioception intact bilat.  No double-simultaneous extinction.  *Coordination:  Finger-to-nose, heel-to-shin, rapid alternating motions were intact. *Reflexes:  2+ and symmetric throughout without clonus; toes down-going bilat *Gait: deferred  NIHSS = 0  LDL 59 A1c 11.4    Impression: Patient is a 55 yo man with hx uncontrolled DM2, hx of bladder cancer receiving active installation chrmotherapy, remote TBI 2010, spells who was BIB EMS as stroke code on 06/10/20 for acute onset R sided weakness and numbness (face/arm/leg) and dysarthria s/p tPA 06/10/20 with complete resolution of deficits and no e/o acute infarct on MRI brain.  Recommendations: - No e/o hemorrhage on 24 hr CT - Started plavix 75mg  daily (not ASA 2/2 anaphylactic rxn to celecoxib) - Continue rosuvastatin 10mg  daily (LDL at goal 59) - q 4 hr neurochecks - STAT head CT for any change in neurologic exam - TTE report pending - Goal normotensive, avoid hypotension - stroke education - smoking cessation  counseling performed - outpatient f/u with neurology after discharge - mgmt of hyperglycemia per hospitalist team - Will continue to follow ______________________________________________________________________   Thank you for the opportunity to take part in the care of this patient. If you have any further questions, please contact the neurology consultation attending.  Su Monks, MD [facility] [phone]  If 7pm- 7am, please page neurology on call as listed in [place].

## 2020-06-11 NOTE — Evaluation (Signed)
Occupational Therapy Evaluation Patient Details Name: Vincent [name] MRN: [id number] DOB: 31-Jan-1966 Today's Date: 06/11/2020    History of Present Illness 55yo male Pt with PMH of anxiety, benign essential tremor, bipolar 2 disorder, bladder cancer, CAD, Sz, uncontrolled DM on insulin, bladder cancer receiving active installation chemo admitted with R sided weakness, numbness, dysarthria and CBG >600. Pt dx with CVA and given tPA. Repeat imaging negative for acute infarct.   Clinical Impression   Pt was seen for OT evaluation this date. 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 Pt has a chronic poor healing wound on R hand and endorses significant neuropathy in both hands and feet. Pt requires assist from his spouse daily for negotiating the flight of stairs to their 2nd floor bedroom and full bath (only a 1/2 bath on the main floor of the home they rent.) Pt reports attempts to get out of the lease in order to find a more accessible home but unable to thus far. Pt endorses that he wants to "be there for my grandkids growing up" and continue to work and acknowledges that his uncontrolled diabetes is impacting this. Pt educated in home/work routines/modifications to support improved medication compliance and chronic disease mgt to minimize uncontrolled blood glucose, minimize falls risk, and maximize quality of life. Pt verbalized understanding. Would continue to benefit from skilled OT services to address chronic disease mgt and implementation of learned multidisciplinary strategies to maximize adherence and self mgt of his chronic health issues in order to maximize safety, independence, and quality of life.  Upon hospital  discharge, recommend HHOT to maximize with emphasis on chronic disease mgt.     Follow Up Recommendations  Other (comment);Supervision - Intermittent (supervision/assist for med mgt, chronic disease mgt)    Equipment Recommendations  None recommended by OT    Recommendations for Other Services       Precautions / Restrictions Precautions Precautions: Fall Restrictions Weight Bearing Restrictions: No      Mobility Bed Mobility Overal bed mobility: Modified Independent                  Transfers Overall transfer level: Modified independent                    Balance Overall balance assessment: Mild deficits observed, not formally tested;History of Falls                                         ADL either performed or assessed with clinical judgement   ADL Overall ADL's : Modified independent                                       General ADL Comments: Supervision for bathing, dressing, toileting for safety; able to perform grooming tasks and self feeding without assist. 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, chronic non healing wound noted on R hand between thumb and 2nd digit)   Lower Extremity Assessment Lower Extremity Assessment: Generalized weakness;Defer to PT evaluation (pt endorses hx muscle loss in RLE 2/2 DM, bilat neuropathy in feet)   Cervical / Trunk Assessment Cervical / Trunk Assessment: Normal   Communication Communication Communication: No difficulties   Cognition Arousal/Alertness: Awake/alert Behavior During Therapy: WFL for  tasks assessed/performed Overall Cognitive Status: Within Functional Limits for tasks assessed                                     General Comments  pt BG 351, RN giving insulin at start of session, cleared pt to work with OT    Exercises Other Exercises Other Exercises: Pt educated in home/work routines/modifications to support improved medication compliance and chronic disease mgt to minimize uncontrolled blood glucose, minimize falls risk, and maximize quality of life.   Shoulder Instructions      Home Living Family/patient expects to be discharged to:: Private residence Living Arrangements: Spouse/significant other Available Help at Discharge: Family;Available 24 hours/day (spouse works from home) Type of Home: House Home Access: Stairs to enter Technical brewer of Steps: 1   Home Layout: Two level;1/2 bath on main level;Bed/bath upstairs Alternate Level Stairs-Number of Steps: 14 steps Alternate Level Stairs-Rails: Left Bathroom Shower/Tub: Tub/shower unit   Bathroom Toilet: Handicapped height     Home Equipment: Cane - single point;Grab bars - tub/shower;Shower seat;Wheelchair - manual;Other (comment)   Additional Comments: has rollator      Prior Functioning/Environment Level of Independence: Independent;Needs assistance  Gait / Transfers Assistance Needed: Amb w/o AD, requires assist only for steps to 2nd floor bed/bath with spouse ADL's / Homemaking Assistance Needed: mod indep with ADL, works in a Engineer, manufacturing systems and for [employer], endorses being too busy to manage his DM properly   Comments: endorses 6 falls in 12 mo, resulting from stairs in home; reports attempts at trying to get out of the lease they are in but unable to so far        OT Problem List: Decreased strength;Impaired sensation;Decreased activity tolerance;Decreased safety awareness;Impaired balance (sitting and/or standing);Decreased knowledge of use of DME or AE       OT Treatment/Interventions: Self-care/ADL training;Therapeutic exercise;Therapeutic activities;DME and/or AE instruction;Patient/family education    OT Goals(Current goals can be found in the care plan section) Acute Rehab OT Goals Patient Stated Goal: feel better OT Goal Formulation: With patient Time For Goal Achievement: 06/25/20 Potential to Achieve Goals: Good ADL Goals Additional ADL Goal #1: Pt will manage blood sugar checks and insulin administration with modified independence including PRN VC/reminders from family/caregivers to maximize health and chronic disease mgt Additional ADL Goal #2: Pt will verbalize plan to implement at least 1 learned falls prevention strategy to maximize safety in the home.  OT Frequency: Min 1X/week   Barriers to D/C:            Co-evaluation              AM-PAC OT "6 Clicks" Daily Activity     Outcome Measure Help from another person eating meals?: None Help from another person taking care  of personal grooming?: None Help from another person toileting, which includes using toliet, bedpan, or urinal?: None Help from another person bathing (including washing, rinsing, drying)?: A Little Help from another person to put on and taking off regular upper body clothing?: None Help from another person to put on and taking off regular lower body clothing?: A Little 6 Click Score: 22   End of Session    Activity Tolerance: Patient tolerated treatment well Patient left: in chair;with call bell/phone within reach;with chair alarm set;Other (comment) (PT for assessment in room at end o fsession)  OT Visit Diagnosis: Other abnormalities of gait and mobility (R26.89);Repeated falls (R29.6);Muscle weakness (generalized) (M62.81)                Time: 6803-2122 OT Time Calculation (min): 35 min Charges:  OT General Charges $OT Visit: 1 Visit OT Evaluation $OT Eval Low Complexity: 1 Low OT Treatments $Self Care/Home Management : 23-37 mins  Hanley Hays, MPH, MS, OTR/L ascom [phone] 06/11/20, 3:36 PM

## 2020-06-12 LAB — GLUCOSE, CAPILLARY
Glucose-Capillary: 292 mg/dL — ABNORMAL HIGH (ref 70–99)
Glucose-Capillary: 301 mg/dL — ABNORMAL HIGH (ref 70–99)

## 2020-06-12 MED ORDER — INSULIN GLARGINE 100 UNIT/ML ~~LOC~~ SOLN
30.0000 [IU] | Freq: Every day | SUBCUTANEOUS | Status: DC
  Administered 2020-06-12: 11:00:00 30 [IU] via SUBCUTANEOUS
  Filled 2020-06-12: qty 0.3

## 2020-06-12 MED ORDER — ARIPIPRAZOLE 10 MG PO TABS: 10.0000 mg | ORAL_TABLET | Freq: Every day | ORAL | Status: DC

## 2020-06-12 MED ORDER — PANTOPRAZOLE SODIUM 40 MG PO TBEC: 40.0000 mg | DELAYED_RELEASE_TABLET | Freq: Every day | ORAL | Status: DC

## 2020-06-12 MED ORDER — CLOPIDOGREL BISULFATE 75 MG PO TABS: 75.0000 mg | ORAL_TABLET | Freq: Every day | ORAL | 0 refills | Status: DC

## 2020-06-12 NOTE — Progress Notes (Signed)
Inpatient Diabetes Program Recommendations  AACE/ADA: New Consensus Statement on Inpatient Glycemic Control (2015)  Target Ranges:  Prepandial:   less than 140 mg/dL      Peak postprandial:   less than 180 mg/dL (1-2 hours)      Critically ill patients:  140 - 180 mg/dL   Lab Results  Component Value Date   GLUCAP 292 (H) 06/12/2020   HGBA1C 11.4 (H) 06/11/2020    Review of Glycemic Control Results for Vincent [name], Vincent [name] (MRN [id number]) as of 06/12/2020 09:24  Ref. Range 06/11/2020 14:19 06/11/2020 18:01 06/11/2020 21:36 06/12/2020 07:38  Glucose-Capillary Latest Ref Range: 70 - 99 mg/dL 351 (H) 382 (H) 245 (H) 292 (H)  History: DM, Bladder cancer on chemo therapy   Home DM Meds: Toujeo 180 units Daily                              Trulicity 4.5 mg Qweek                              Metformin 500 mg BID   ENDO: Dr. Sarita Bottom seen 02/25/2020--No Changes made to meds   Current Orders: Lantus 30 units QHS                             Novolog 0-15 units TID ac/hs Inpatient Diabetes Program Recommendations:    Consider increasing Lantus to 45 units q HS.  Also consider adding Novolog 5 units tid with meals (hold if patient eats less than 50% or NPO).   Thanks,  Adah Perl, RN, BC-ADM Inpatient Diabetes Coordinator Pager [phone] (8a-5p)

## 2020-06-12 NOTE — Plan of Care (Signed)
Shift Summary: No acute events overnight. NIH 0. NSR on telemetry, Aox4, remains on RA. C/o chronic pain addressed with scheduled medications. Rounding performed, needs/concerns addressed, fall/safety/bleeding precautions in place. Continuing w/POC.  Problem: Education: Goal: Knowledge of General Education information will improve Description: Including pain rating scale, medication(s)/side effects and non-pharmacologic comfort measures Outcome: Progressing   Problem: Health Behavior/Discharge Planning: Goal: Ability to manage health-related needs will improve Outcome: Progressing   Problem: Clinical Measurements: Goal: Ability to maintain clinical measurements within normal limits will improve Outcome: Progressing Goal: Will remain free from infection Outcome: Progressing Goal: Diagnostic test results will improve Outcome: Progressing Goal: Respiratory complications will improve Outcome: Progressing Goal: Cardiovascular complication will be avoided Outcome: Progressing   Problem: Activity: Goal: Risk for activity intolerance will decrease Outcome: Progressing   Problem: Nutrition: Goal: Adequate nutrition will be maintained Outcome: Progressing   Problem: Coping: Goal: Level of anxiety will decrease Outcome: Progressing   Problem: Elimination: Goal: Will not experience complications related to bowel motility Outcome: Progressing Goal: Will not experience complications related to urinary retention Outcome: Progressing   Problem: Pain Managment: Goal: General experience of comfort will improve Outcome: Progressing   Problem: Safety: Goal: Ability to remain free from injury will improve Outcome: Progressing   Problem: Skin Integrity: Goal: Risk for impaired skin integrity will decrease Outcome: Progressing

## 2020-06-12 NOTE — Discharge Summary (Signed)
Physician Discharge Summary  Vincent [name] BUL:[id number] DOB: 1965/06/23 DOA: 06/10/2020  PCP: Hayden Rasmussen, MD  Admit date: 06/10/2020 Discharge date: 06/12/2020  Discharge disposition: Home   Recommendations for Outpatient Follow-Up:   1. Follow-up with PCP in 1 week 2. Follow-up with neurologist within 1 month of discharge 3. Follow-up with endocrinologist as soon as possible for management of diabetes mellitus   Discharge Diagnosis:   Active Problems:   Ischemic stroke ([place])   Hyperglycemia    Discharge Condition: Stable.  Diet recommendation:  Diet Order            Diet - low sodium heart healthy           Diet Carb Modified           Diet Carb Modified Fluid consistency: Thin; Room service appropriate? Yes  Diet effective now                   Code Status: Full Code     Hospital Course:   Vincent [name] is a 55 year old male with multiple medical problems including stroke, rheumatoid arthritis, COPD, hypertension, hypogonadism, anxiety, benign essential tremor, bipolar 2 disorder, ? eizure disorder (differential diagnosis complex partial seizure, mood disorder, pseudoseizure-negative EEG), transient confusion, insulin-dependent diabetes mellitus, bladder cancer receiving installation chemotherapy, TBI.  He presented to the hospital because of acute onset of dysarthria and right-sided weakness and numbness involving the face, arm and leg.  He also complained of chest pain, shortness of breath and lightheadedness.  CT did not show any acute abnormality but clinical findings were consistent with acute stroke.  There was no large vessel occlusion on CTA head and neck.  He was evaluated by the neurologist who recommended TPA.  She was given TPA and was admitted to the ICU for close monitoring.  Repeat CT head did not show any evidence of acute stroke.  MRI brain did not show any structural evidence of acute stroke.  Neurologist recommended Plavix.  All his  symptoms have resolved.  He did well with PT and OT.  He also had hyponatremia and AKI which resolved with IV fluids.  He has uncontrolled diabetes mellitus.  His glucose level was 694 on admission.  Hemoglobin A1c was 11.4.  He said he is supposed to take Toujeo 180 units but he has not been medically adherent.  Chart review showed that he last saw his endocrinologist, Dr. Renato Shin, on 02/25/2020 and the dose of Toujeo prescribed was 180 units daily.  He is deemed stable for discharge to home today.  The importance of medical adherence was emphasized.  He understands that poorly controlled diabetes is associated with increased risk of infections, stroke, MI, poor wound healing and increased risk of death.  Discharge plan was discussed with the patient and his wife at the bedside.    Medical Consultants:    Neurologist  Intensivist   Discharge Exam:    Vitals:   06/11/20 2101 06/11/20 2340 06/12/20 0441 06/12/20 0737  BP: 124/68 132/72 (!) 147/78 (!) 142/62  Pulse: 77 69 68 66  Resp: 11 15 13 18   Temp: 98.4 F (36.9 C) (!) 97.5 F (36.4 C) 97.8 F (36.6 C) 98.1 F (36.7 C)  TempSrc: Oral Oral Oral   SpO2: 99% 98% 98% 98%  Weight:      Height:         GEN: NAD SKIN: Chronic wound on the dorsal area between the right thumb and index finger EYES:  EOM, PERRLA ENT: MMM CV: RRR PULM: CTA B ABD: soft, ND, NT, +BS CNS: AAO x 3, non focal EXT: No edema or tenderness   The results of significant diagnostics from this hospitalization (including imaging, microbiology, ancillary and laboratory) are listed below for reference.     Procedures and Diagnostic Studies:   CT HEAD WO CONTRAST  Result Date: 06/11/2020 CLINICAL DATA:  Stroke, follow-up. Additional history provided: 24 hour follow-up CT scan post tPA/CVA. EXAM: CT HEAD WITHOUT CONTRAST TECHNIQUE: Contiguous axial images were obtained from the base of the skull through the vertex without intravenous contrast.  COMPARISON:  Same-day brain MRI 06/11/2020. Noncontrast head CT and CT angiogram head/neck 06/10/2020. FINDINGS: Brain: Cerebral volume is normal for age. There is no acute intracranial hemorrhage. No demarcated cortical infarct. No extra-axial fluid collection. No evidence of intracranial mass. No midline shift. Vascular: No hyperdense vessel.  Atherosclerotic calcifications. Skull: Normal. Negative for fracture or focal lesion. Sinuses/Orbits: Visualized orbits show no acute finding. Small mucous retention cyst within a posterior right ethmoid air cell. Background trace bilateral ethmoid sinus mucosal thickening. IMPRESSION: Stable non-contrast CT appearance of the brain. No evidence of acute intracranial abnormality. Electronically Signed   By: Kellie Simmering DO   On: 06/11/2020 11:41   MR BRAIN WO CONTRAST  Result Date: 06/11/2020 CLINICAL DATA:  Provided history: Stroke, follow-up. EXAM: MRI HEAD WITHOUT CONTRAST TECHNIQUE: Multiplanar, multiecho pulse sequences of the brain and surrounding structures were obtained without intravenous contrast. COMPARISON:  Same day head CT 06/11/2020. Noncontrast head CT and CT angiogram head/neck 06/10/2020. Brain MRI 10/20/2018. FINDINGS: Brain: Cerebral volume is normal for age. No cortical encephalomalacia is identified. No significant white matter disease. Redemonstrated subcentimeter chronic infarct within the inferior right cerebellum (series 10, image 4) (series 17, image 8). There is no acute infarct. No evidence of intracranial mass. No chronic intracranial blood products. No extra-axial fluid collection. No midline shift. Vascular: Expected proximal arterial flow voids. Skull and upper cervical spine: No focal marrow lesion. Sinuses/Orbits: Visualized orbits show no acute finding. Trace bilateral ethmoid and maxillary sinus mucosal thickening. Superimposed 13 mm left maxillary sinus mucous retention cyst. IMPRESSION: No evidence of acute intracranial abnormality,  including acute infarction. Redemonstrated chronic subcentimeter infarct within the right cerebellar hemisphere. Otherwise unremarkable noncontrast MRI appearance of the brain. Mild paranasal sinus disease as described. Electronically Signed   By: Kellie Simmering DO   On: 06/11/2020 11:51   ECHOCARDIOGRAM COMPLETE  Result Date: 06/11/2020    ECHOCARDIOGRAM REPORT   Patient Name:   THANIEL COLUCCIO [facility] Date of Exam: 06/11/2020 Medical Rec #:  [id number]       Height:       72.0 in Accession #:    [id number]      Weight:       209.0 lb Date of Birth:  1965-11-26        BSA:          2.171 m Patient Age:    45 years        BP:           138/55 mmHg Patient Gender: M               HR:           65 bpm. Exam Location:  [facility] Procedure: 2D Echo, Color Doppler and Cardiac Doppler Indications:     H29.92 CHF-Acute Systolic  History:         Patient has prior history of Echocardiogram examinations, most  recent 10/20/2018. CAD; Risk Factors:Diabetes, Hypertension and                  Dyslipidemia.  Sonographer:     Charmayne Sheer RDCS (AE) Referring Phys:  [id number] Flora Lipps Diagnosing Phys: Ida Rogue MD  Sonographer Comments: Suboptimal parasternal window. Global longitudinal strain was attempted. IMPRESSIONS  1. Left ventricular ejection fraction, by estimation, is 60 to 65%. The left ventricle has normal function. The left ventricle has no regional wall motion abnormalities. There is mild left ventricular hypertrophy. Left ventricular diastolic parameters are consistent with Grade II diastolic dysfunction (pseudonormalization). The average left ventricular global longitudinal strain is -16.5 %. The global longitudinal strain is normal.  2. Right ventricular systolic function is normal. The right ventricular size is normal. Tricuspid regurgitation signal is inadequate for assessing PA pressure.  3. Challenging image quality FINDINGS  Left Ventricle: Left ventricular ejection fraction, by estimation, is 60 to 65%. The  left ventricle has normal function. The left ventricle has no regional wall motion abnormalities. The average left ventricular global longitudinal strain is -16.5 %. The global longitudinal strain is normal. The left ventricular internal cavity size was normal in size. There is mild left ventricular hypertrophy. Left ventricular diastolic parameters are consistent with Grade II diastolic dysfunction (pseudonormalization). Right Ventricle: The right ventricular size is normal. No increase in right ventricular wall thickness. Right ventricular systolic function is normal. Tricuspid regurgitation signal is inadequate for assessing PA pressure. Left Atrium: Left atrial size was normal in size. Right Atrium: Right atrial size was normal in size. Pericardium: There is no evidence of pericardial effusion. Mitral Valve: The mitral valve is normal in structure. No evidence of mitral valve regurgitation. No evidence of mitral valve stenosis. MV peak gradient, 2.0 mmHg. The mean mitral valve gradient is 1.0 mmHg. Tricuspid Valve: The tricuspid valve is normal in structure. Tricuspid valve regurgitation is not demonstrated. No evidence of tricuspid stenosis. Aortic Valve: The aortic valve was not well visualized. Aortic valve regurgitation is not visualized. No aortic stenosis is present. Aortic valve mean gradient measures 5.0 mmHg. Aortic valve peak gradient measures 8.2 mmHg. Aortic valve area, by VTI measures 2.92 cm. Pulmonic Valve: The pulmonic valve was normal in structure. Pulmonic valve regurgitation is not visualized. No evidence of pulmonic stenosis. Aorta: The aortic root is normal in size and structure. Venous: The inferior vena cava is normal in size with greater than 50% respiratory variability, suggesting right atrial pressure of 3 mmHg. IAS/Shunts: No atrial level shunt detected by color flow Doppler.  LEFT VENTRICLE PLAX 2D LVIDd:         4.20 cm  Diastology LVIDs:         2.90 cm  LV e' medial:    8.16 cm/s  LV PW:         1.10 cm  LV E/e' medial:  8.0 LV IVS:        0.70 cm  LV e' lateral:   15.00 cm/s LVOT diam:     2.40 cm  LV E/e' lateral: 4.3 LV SV:         80 LV SV Index:   37       2D Longitudinal Strain LVOT Area:     4.52 cm 2D Strain GLS Avg:     -16.5 %  RIGHT VENTRICLE RV Basal diam:  2.80 cm TAPSE (M-mode): 2.8 cm LEFT ATRIUM             Index  RIGHT ATRIUM           Index LA diam:        3.10 cm 1.43 cm/m  RA Area:     13.30 cm LA Vol (A2C):   48.9 ml 22.53 ml/m RA Volume:   28.20 ml  12.99 ml/m LA Vol (A4C):   22.2 ml 10.23 ml/m LA Biplane Vol: 35.4 ml 16.31 ml/m  AORTIC VALVE                    PULMONIC VALVE AV Area (Vmax):    2.91 cm     PV Vmax:       0.90 m/s AV Area (Vmean):   2.68 cm     PV Vmean:      67.600 cm/s AV Area (VTI):     2.92 cm     PV VTI:        0.191 m AV Vmax:           143.00 cm/s  PV Peak grad:  3.2 mmHg AV Vmean:          104.000 cm/s PV Mean grad:  2.0 mmHg AV VTI:            0.273 m AV Peak Grad:      8.2 mmHg AV Mean Grad:      5.0 mmHg LVOT Vmax:         92.00 cm/s LVOT Vmean:        61.700 cm/s LVOT VTI:          0.176 m LVOT/AV VTI ratio: 0.64  AORTA Ao Root diam: 3.70 cm MITRAL VALVE MV Area (PHT): 2.90 cm    SHUNTS MV Area VTI:   3.84 cm    Systemic VTI:  0.18 m MV Peak grad:  2.0 mmHg    Systemic Diam: 2.40 cm MV Mean grad:  1.0 mmHg MV Vmax:       0.71 m/s MV Vmean:      47.6 cm/s MV Decel Time: 262 msec MV E velocity: 65.10 cm/s MV A velocity: 47.60 cm/s MV E/A ratio:  1.37 Ida Rogue MD Electronically signed by Ida Rogue MD Signature Date/Time: 06/11/2020/4:23:46 PM    Final      Labs:   Basic Metabolic Panel: Recent Labs  Lab 06/10/20 1050 06/11/20 0559  NA 129* 136  K 4.8 4.0  CL 97* 105  CO2 21* 21*  GLUCOSE 694* 373*  BUN 30* 25*  CREATININE 1.60* 1.11  CALCIUM 8.7* 8.8*  MG  --  2.1  PHOS  --  4.1   GFR Estimated Creatinine Clearance: 93 mL/min (by C-G formula based on SCr of 1.11 mg/dL). Liver Function  Tests: Recent Labs  Lab 06/10/20 1050  AST 19  ALT 16  ALKPHOS 81  BILITOT 0.6  PROT 6.3*  ALBUMIN 3.5   No results for input(s): LIPASE, AMYLASE in the last 168 hours. No results for input(s): AMMONIA in the last 168 hours. Coagulation profile Recent Labs  Lab 06/10/20 1050  INR 1.1    CBC: Recent Labs  Lab 06/10/20 1050 06/11/20 0559  WBC 5.9 6.1  NEUTROABS 3.5 3.3  HGB 12.7* 13.6  HCT 37.1* 40.6  MCV 79.8* 81.7  PLT 191 192   Cardiac Enzymes: No results for input(s): CKTOTAL, CKMB, CKMBINDEX, TROPONINI in the last 168 hours. BNP: Invalid input(s): POCBNP CBG: Recent Labs  Lab 06/11/20 1419 06/11/20 1801 06/11/20 2136 06/12/20 0738 06/12/20 1051  GLUCAP 351* 382*  245* 292* 301*   D-Dimer No results for input(s): DDIMER in the last 72 hours. Hgb A1c Recent Labs    06/11/20 0559  HGBA1C 11.4*   Lipid Profile Recent Labs    06/11/20 0559  CHOL 172  HDL 36*  LDLCALC 59  TRIG 386*  CHOLHDL 4.8   Thyroid function studies No results for input(s): TSH, T4TOTAL, T3FREE, THYROIDAB in the last 72 hours.  Invalid input(s): FREET3 Anemia work up No results for input(s): VITAMINB12, FOLATE, FERRITIN, TIBC, IRON, RETICCTPCT in the last 72 hours. Microbiology Recent Results (from the past 240 hour(s))  Resp Panel by RT-PCR (Flu A&B, Covid) Nasopharyngeal Swab     Status: None   Collection Time: 06/10/20 10:50 AM   Specimen: Nasopharyngeal Swab; Nasopharyngeal(NP) swabs in vial transport medium  Result Value Ref Range Status   SARS Coronavirus 2 by RT PCR NEGATIVE NEGATIVE Final    Comment: (NOTE) SARS-CoV-2 target nucleic acids are NOT DETECTED.  The SARS-CoV-2 RNA is generally detectable in upper respiratory specimens during the acute phase of infection. The lowest concentration of SARS-CoV-2 viral copies this assay can detect is 138 copies/mL. A negative result does not preclude SARS-Cov-2 infection and should not be used as the sole basis for  treatment or other patient management decisions. A negative result may occur with  improper specimen collection/handling, submission of specimen other than nasopharyngeal swab, presence of viral mutation(s) within the areas targeted by this assay, and inadequate number of viral copies(<138 copies/mL). A negative result must be combined with clinical observations, patient history, and epidemiological information. The expected result is Negative.  Fact Sheet for Patients:  EntrepreneurPulse.com.au  Fact Sheet for Healthcare Providers:  IncredibleEmployment.be  This test is no t yet approved or cleared by the [place] FDA and  has been authorized for detection and/or diagnosis of SARS-CoV-2 by FDA under an Emergency Use Authorization (EUA). This EUA will remain  in effect (meaning this test can be used) for the duration of the COVID-19 declaration under Section 564(b)(1) of the Act, 21 U.S.C.section 360bbb-3(b)(1), unless the authorization is terminated  or revoked sooner.       Influenza A by PCR NEGATIVE NEGATIVE Final   Influenza B by PCR NEGATIVE NEGATIVE Final    Comment: (NOTE) The Xpert Xpress SARS-CoV-2/FLU/RSV plus assay is intended as an aid in the diagnosis of influenza from Nasopharyngeal swab specimens and should not be used as a sole basis for treatment. Nasal washings and aspirates are unacceptable for Xpert Xpress SARS-CoV-2/FLU/RSV testing.  Fact Sheet for Patients: EntrepreneurPulse.com.au  Fact Sheet for Healthcare Providers: IncredibleEmployment.be  This test is not yet approved or cleared by the [place] FDA and has been authorized for detection and/or diagnosis of SARS-CoV-2 by FDA under an Emergency Use Authorization (EUA). This EUA will remain in effect (meaning this test can be used) for the duration of the COVID-19 declaration under Section 564(b)(1) of the Act, 21  U.S.C. section 360bbb-3(b)(1), unless the authorization is terminated or revoked.  Performed at [facility], [address] [place], [place] [address]   MRSA PCR Screening     Status: None   Collection Time: 06/10/20  2:30 PM   Specimen: Nasopharyngeal  Result Value Ref Range Status   MRSA by PCR NEGATIVE NEGATIVE Final    Comment:        The GeneXpert MRSA Assay (FDA approved for NASAL specimens only), is one component of a comprehensive MRSA colonization surveillance program. It is not intended to diagnose  MRSA infection nor to guide or monitor treatment for MRSA infections. Performed at [facility], [address], [place],  [address]      Discharge Instructions:   Discharge Instructions    Diet - low sodium heart healthy   Complete by: As directed    Diet Carb Modified   Complete by: As directed    Discharge instructions   Complete by: As directed    Follow-up with PCP or endocrinologist for recommendations on insulin dose if glucose levels persistently drop below 120   Discharge wound care:   Complete by: As directed    Keep wound clean and dry   Increase activity slowly   Complete by: As directed      Allergies as of 06/12/2020      Reactions   Celebrex [celecoxib] Anaphylaxis, Rash   Hydrocodone Rash, Other (See Comments)   "blisters developed on arms"   Sulfa Antibiotics Rash      Medication List    TAKE these medications   ARIPiprazole 10 MG tablet Commonly known as: ABILIFY Take 1 tablet (10 mg total) by mouth daily. Take 1/2 tab for a week and than stop   clopidogrel 75 MG tablet Commonly known as: PLAVIX Take 1 tablet (75 mg total) by mouth daily.   fenofibrate 160 MG tablet Take 160 mg by mouth daily.   FreeStyle Libre 2 Sensor Misc 1 Device by Does not apply route every 14 (fourteen) days. What changed: additional instructions   gabapentin 600 MG tablet Commonly known as: NEURONTIN Take 600 mg by mouth 2  (two) times daily.   isosorbide mononitrate 120 MG 24 hr tablet Commonly known as: IMDUR Take 1 tablet (120 mg total) by mouth daily. Please make annual appt in April with Dr. Percival Spanish for refills. Thank you What changed: additional instructions   lamoTRIgine 150 MG tablet Commonly known as: LAMICTAL Take 1 tablet by mouth twice daily   Latuda 20 MG Tabs tablet Generic drug: lurasidone Take one tab daily for one week and than twice daily   levothyroxine 25 MCG tablet Commonly known as: SYNTHROID Take 25 mcg by mouth daily before breakfast.   metFORMIN 500 MG 24 hr tablet Commonly known as: GLUCOPHAGE-XR Take 500 mg by mouth 2 (two) times daily.   metoCLOPramide 10 MG tablet Commonly known as: REGLAN Take by mouth 3 (three) times daily with meals.   morphine 15 MG tablet Commonly known as: MSIR Take 15 mg by mouth 2 (two) times daily as needed. For breakthrough pain  (03-26-2020 per pt wife pt takes this twice also   morphine 60 MG 12 hr tablet Commonly known as: MS CONTIN Take 60 mg by mouth 2 (two) times daily. Every 12 hours   omeprazole 40 MG capsule Commonly known as: PRILOSEC Take 40 mg by mouth daily.   pregabalin 200 MG capsule Commonly known as: LYRICA Take 200 mg by mouth See admin instructions. Take 200 mg by mouth in the morning and 400 mg at night   rosuvastatin 10 MG tablet Commonly known as: CRESTOR Take 10 mg by mouth daily.   sucralfate 1 g tablet Commonly known as: CARAFATE Take 1 g by mouth 2 (two) times daily.   tamsulosin 0.4 MG Caps capsule Commonly known as: FLOMAX Take 0.8 mg by mouth at bedtime.   Toujeo Max SoloStar 300 UNIT/ML Solostar Pen Generic drug: insulin glargine (2 Unit Dial) Inject 180 Units into the skin every morning. And pen needles 1/day What changed:  when to take this  additional instructions   traZODone 150 MG tablet Commonly known as: DESYREL Take 1 tablet (150 mg total) by mouth at bedtime.   Trulicity  4.5 DT/2.6ZT Sopn Generic drug: Dulaglutide Inject 4.5 mg into the skin once a week. What changed: when to take this   venlafaxine XR 150 MG 24 hr capsule Commonly known as: EFFEXOR-XR Take 1 capsule (150 mg total) by mouth daily with breakfast.   Vitamin D (Ergocalciferol) 1.25 MG (50000 UNIT) Caps capsule Commonly known as: DRISDOL Take 50,000 Units by mouth every Wednesday.   Zenpep 5000-24000 units Cpep Generic drug: Pancrelipase (Lip-Prot-Amyl) Take 1-2 capsules by mouth See admin instructions. Takes 1 capsule in the morning and 2 capsules at night            Discharge Care Instructions  (From admission, onward)         Start     Ordered   06/12/20 0000  Discharge wound care:       Comments: Keep wound clean and dry   06/12/20 1054            Time coordinating discharge: 31 minutes  Signed:  Conlee Sliter  [facility] 06/12/2020, 11:04 AM   Pager on [email or web address]. If 7PM-7AM, please contact night-coverage at [email or web address]

## 2020-06-14 NOTE — Telephone Encounter (Signed)
Due to recent hospitalization because of stroke, I will avoid adding antipsychotic medication at this time.

## 2020-06-15 ENCOUNTER — Ambulatory Visit: Payer: Medicare Other

## 2020-06-18 ENCOUNTER — Ambulatory Visit: Payer: Medicare Other | Admitting: Podiatry

## 2020-06-21 ENCOUNTER — Ambulatory Visit: Payer: Medicare Other | Admitting: Orthotics

## 2020-07-05 ENCOUNTER — Other Ambulatory Visit: Payer: Self-pay | Admitting: Neurology

## 2020-07-05 ENCOUNTER — Other Ambulatory Visit (HOSPITAL_COMMUNITY): Payer: Self-pay | Admitting: Psychiatry

## 2020-07-05 DIAGNOSIS — F3181 Bipolar II disorder: Secondary | ICD-10-CM

## 2020-07-05 DIAGNOSIS — F4312 Post-traumatic stress disorder, chronic: Secondary | ICD-10-CM

## 2020-07-06 ENCOUNTER — Other Ambulatory Visit: Payer: Self-pay

## 2020-07-06 ENCOUNTER — Telehealth (HOSPITAL_COMMUNITY): Payer: No Typology Code available for payment source | Admitting: Psychiatry

## 2020-07-07 ENCOUNTER — Other Ambulatory Visit: Payer: Self-pay

## 2020-07-07 ENCOUNTER — Telehealth (INDEPENDENT_AMBULATORY_CARE_PROVIDER_SITE_OTHER): Payer: No Typology Code available for payment source | Admitting: Psychiatry

## 2020-07-07 ENCOUNTER — Telehealth (HOSPITAL_COMMUNITY): Payer: Self-pay | Admitting: *Deleted

## 2020-07-07 ENCOUNTER — Encounter (HOSPITAL_COMMUNITY): Payer: Self-pay | Admitting: Psychiatry

## 2020-07-07 DIAGNOSIS — F4312 Post-traumatic stress disorder, chronic: Secondary | ICD-10-CM

## 2020-07-07 DIAGNOSIS — F3181 Bipolar II disorder: Secondary | ICD-10-CM

## 2020-07-07 MED ORDER — LATUDA 20 MG PO TABS: ORAL_TABLET | ORAL | 0 refills | Status: DC

## 2020-07-07 MED ORDER — VENLAFAXINE HCL ER 150 MG PO CP24: 150.0000 mg | ORAL_CAPSULE | Freq: Every day | ORAL | 0 refills | Status: DC

## 2020-07-07 MED ORDER — TRAZODONE HCL 150 MG PO TABS: 150.0000 mg | ORAL_TABLET | Freq: Every day | ORAL | 0 refills | Status: DC

## 2020-07-07 MED ORDER — ARIPIPRAZOLE 10 MG PO TABS: ORAL_TABLET | ORAL | 0 refills | Status: DC

## 2020-07-07 NOTE — Telephone Encounter (Signed)
Writer spoke with pt wife, [name], advising that samples of Latuda 20mg  are available to be picked up at his earliest convinence. Mrs. Geddis verbalizes understanding.

## 2020-07-07 NOTE — Progress Notes (Signed)
Virtual Visit via Telephone Note  I connected with Vincent [name] on 07/07/20 at  8:40 AM EDT by telephone and verified that I am speaking with the correct person using two identifiers.  Location: Patient: Home Provider: Home Office   I discussed the limitations, risks, security and privacy concerns of performing an evaluation and management service by telephone and the availability of in person appointments. I also discussed with the patient that there may be a patient responsible charge related to this service. The patient expressed understanding and agreed to proceed.   History of Present Illness: Patient is evaluated by phone session.  Patient was admitted on April 1 because of dysarthria, weakness, numbness and chest pain with shortness of breath.  He was given tPA and discharged after stabilization.  His hemoglobin A1c was 11.4 and his labs were abnormal.  Patient admitted he was poorly controlled hyperglycemia.  Patient has multiple health issues with history of stroke, uncontrolled diabetes, seizure disorder, hypertension, COPD.  Patient doing better and back to work.  He works in a [employer].  On last visit we recommended to taper Abilify as patient continues to have irritability, mood swings, highs and lows and racing thoughts.  He wanted to try a different medication because he felt Abilify was not helping the symptoms.  We started him on [place] but his insurance did not approve.  Patient still has residual symptoms of mood swings, anger, getting frustration and angry for no reason.  He denies any hallucination, paranoia, suicidal thoughts.  He had significant weight last and his blood pressure, labs are abnormal.  Though he had better hemoglobin A1c but is still very high.  Patient is still taking venlafaxine, trazodone.  He is sleeping 5 to 6 hours but denies any nightmares or any flashbacks.  He also taking gabapentin, Lyrica, Lamictal from other provider.  Patient lives with his wife.   I notice he has significant weight loss.  Patient like to try Latuda and wondering if he had some samples.   Past Psychiatric History:Reviewed. H/Oone brief hospitalization at [facility] due to suicidal thoughts. No h/osuicidal attempt. H/Oanger, anxiety, PTSD and heavy substance use. Claims to be sober forwhile.Tried Depakote, Zoloft, lithium, Prozac and Xanax.[name] helped but stopped due to polypharmacy.  Psychiatric Specialty Exam: Physical Exam  Review of Systems  Weight 208 lb (94.3 kg).There is no height or weight on file to calculate BMI.  General Appearance: NA  Eye Contact:  NA  Speech:  Slow  Volume:  Decreased  Mood:  Irritable  Affect:  NA  Thought Process:  Descriptions of Associations: Intact  Orientation:  Full (Time, Place, and Person)  Thought Content:  Rumination  Suicidal Thoughts:  No  Homicidal Thoughts:  No  Memory:  Immediate;   Fair Recent;   Fair Remote;   Fair  Judgement:  Fair  Insight:  Fair  Psychomotor Activity:  NA  Concentration:  Concentration: Fair and Attention Span: Fair  Recall:  [employer] of Knowledge:  Fair  Language:  Good  Akathisia:  No  Handed:  Right  AIMS (if indicated):     Assets:  Communication Skills Desire for Improvement Social Support  ADL's:  Intact  Cognition:  WNL  Sleep:   5 hrs      Assessment and Plan: Bipolar disorder type II.  PTSD.  I reviewed blood work results, current medication and recent discharge summary.  We will try Latuda samples as patient's insurance did not approve.  Patient's  chronic health issues are not stable and I will defer adding medication that increases hyperglycemia.  Anette Guarneri has a better side effect profile on metabolic syndrome.  I recommend not to take Abilify after cutting down to half dose for 1 week.  We will provide samples Latuda 20 mg daily for 1 week and then twice a day.  He had tried lithium in the past but his creatinine is 1.1 and given the history of poor  compliance with medication I will defer lithium.  He will continue the Effexor 150 mg daily, trazodone 150 mg at bedtime.  He is getting Lyrica, Lamictal, gabapentin and pain medicine from other providers.  We have discussed therapy but at this time patient is not interested.  Encouraged to take his medicine as prescribed and keep appointment with his physician.  Discussed safety concern that anytime having active suicidal thoughts or homicidal thought that he need to call 911 or go to local emergency room.  Follow-up in 4 weeks.  Follow Up Instructions:    I discussed the assessment and treatment plan with the patient. The patient was provided an opportunity to ask questions and all were answered. The patient agreed with the plan and demonstrated an understanding of the instructions.   The patient was advised to call back or seek an in-person evaluation if the symptoms worsen or if the condition fails to improve as anticipated.  I provided 35 minutes of non-face-to-face time during this encounter.   Kathlee Nations, MD

## 2020-07-28 ENCOUNTER — Other Ambulatory Visit: Payer: Self-pay | Admitting: *Deleted

## 2020-07-28 ENCOUNTER — Ambulatory Visit (INDEPENDENT_AMBULATORY_CARE_PROVIDER_SITE_OTHER): Payer: No Typology Code available for payment source | Admitting: Neurology

## 2020-07-28 ENCOUNTER — Encounter: Payer: Self-pay | Admitting: Neurology

## 2020-07-28 ENCOUNTER — Other Ambulatory Visit: Payer: Self-pay

## 2020-07-28 VITALS — BP 92/58 | HR 78 | Ht 72.0 in | Wt 204.0 lb

## 2020-07-28 DIAGNOSIS — I639 Cerebral infarction, unspecified: Secondary | ICD-10-CM

## 2020-07-28 DIAGNOSIS — R269 Unspecified abnormalities of gait and mobility: Secondary | ICD-10-CM | POA: Diagnosis not present

## 2020-07-28 DIAGNOSIS — M79604 Pain in right leg: Secondary | ICD-10-CM | POA: Diagnosis not present

## 2020-07-28 DIAGNOSIS — F4312 Post-traumatic stress disorder, chronic: Secondary | ICD-10-CM

## 2020-07-28 DIAGNOSIS — F3181 Bipolar II disorder: Secondary | ICD-10-CM

## 2020-07-28 DIAGNOSIS — G40909 Epilepsy, unspecified, not intractable, without status epilepticus: Secondary | ICD-10-CM | POA: Diagnosis not present

## 2020-07-28 DIAGNOSIS — E1142 Type 2 diabetes mellitus with diabetic polyneuropathy: Secondary | ICD-10-CM

## 2020-07-28 MED ORDER — PREGABALIN 225 MG PO CAPS: 225.0000 mg | ORAL_CAPSULE | ORAL | 5 refills | Status: DC

## 2020-07-28 MED ORDER — PREGABALIN 225 MG PO CAPS: 225.0000 mg | ORAL_CAPSULE | Freq: Three times a day (TID) | ORAL | 5 refills | Status: DC

## 2020-07-28 MED ORDER — VENLAFAXINE HCL ER 150 MG PO CP24: 150.0000 mg | ORAL_CAPSULE | Freq: Every day | ORAL | 3 refills | Status: DC

## 2020-07-28 MED ORDER — CLOPIDOGREL BISULFATE 75 MG PO TABS
75.0000 mg | ORAL_TABLET | Freq: Every day | ORAL | 4 refills | Status: DC
Start: 2020-07-28 — End: 2021-08-15

## 2020-07-28 MED ORDER — DULOXETINE HCL 60 MG PO CPEP: 60.0000 mg | ORAL_CAPSULE | Freq: Every day | ORAL | 12 refills | Status: DC

## 2020-07-28 MED ORDER — PREGABALIN 225 MG PO CAPS: ORAL_CAPSULE | ORAL | 5 refills | Status: DC

## 2020-07-28 MED ORDER — LAMOTRIGINE 150 MG PO TABS: 150.0000 mg | ORAL_TABLET | Freq: Two times a day (BID) | ORAL | 4 refills | Status: DC

## 2020-07-28 NOTE — Telephone Encounter (Signed)
The PA for pregabalin 225mg , TID has been denied by his insurance company. At that dose, they will only cover two capsules daily. I spoke to [name] who verbally gave orders for the following options:  1) new rx for gabapentin 800mg , one cap TID  OR  2) new rx for pregabalin 225mg , one cap BID to be used with insurance (will still likely need a PA). Separate rx for pregabalin 225mg ,one cap QD to be paid for using a goodrx.com coupon. This will give him the TID dosing originally planned.   Left message requesting a call back to discuss.

## 2020-07-28 NOTE — Telephone Encounter (Signed)
PA for pregabalin 225mg  started on covermymeds (key: [id number]). Pt has pharmacy coverage through [employer] [phone]). Decision pending.

## 2020-07-28 NOTE — Telephone Encounter (Addendum)
The patient called back. He would like the two separate prescriptions for pregabalin 225mg . He will get #60 w/ insurance benefits and #30 w/ goodrx coupon ($13.98).

## 2020-07-28 NOTE — Progress Notes (Signed)
Chief Complaint  Patient presents with  . Consult    He is here with his wife, Vincent [name]. Hospital follow up from [facility] in March 2022. He was started on Plavix 75mg  daily. His gait is still off but otherwise back to baseline. Denies any seizure-like activity. Still taking lamotrigine. His neuropathy is worse in feet and hands.       ASSESSMENT AND PLAN  Vincent [name] is a 55 y.o. male   Diabetic peripheral neuropathy Neuropathic pain Gait abnormality Bipolar disorder, Polypharmacy treatment Previous seizure-like activity  Extensive evaluation of neuraxis in the past showed no significant structural abnormality  His main difficulty is lower extremity neuropathic pain, diabetic peripheral neuropathy, poorly controlled diabetes, A1c in April 2022 was 11.4, October 21 was 15.5  Continue current dose of lamotrigine 150 mg twice a day, increased Lyrica to 225 mg 3 times a day, is also under chronic pain management, using regular MS Contin and morphine  Add on Cymbalta 60 mg daily  Encourage moderate exercise,  Return to clinic in 9 to 12 months with nurse practitioner    DIAGNOSTIC DATA (LABS, IMAGING, TESTING) - I reviewed patient records, labs, notes, testing and imaging myself where available.  MRI of the brain without contrast June 11, 2020, no acute abnormality, chronic subcentimeter infarction within the right cerebellar hemisphere,  CT angiogram of the chest no findings to suggest aortic dissection, aortic atherosclerosis, coronary artery calcification, calcification of proximal right renal artery with approximately 40% stenosis  Angiogram of head and neck no large vessel disease, widespread atherosclerosis in the head and neck, up to 50% stenosis of right proximal internal carotid artery,  CT head without contrast June 10, 2020 no acute abnormality  Laboratory evaluation in April 2022, A1c 11.4, lipid panel, triglycerides 386, LDL 59, HDL 36, negative  HIV  HISTORICAL Vincent [name] is a 55 year old, seen in request by Dr. Tenny Craw for evaluation of right leg pain, gait abnormality,he is accompanied by his wife at today's visit on April 24, 2019.  I have reviewed and summarized the referring note from the referring physician.  He has past medical history of hypertension, hyperlipidemia,  insulin-dependent poorly controlled diabetes, bipolar disorder, history of bladder cancer, chronic pain, is under pain management, longtime smoker,  Our office saw him since 2015, because he had recurrent episode of confusion, some nocturnal seizure-like episode, previous work-up including MRI of the brain, and EEG was normal in 2015.  He was started on lamotrigine, tolerating it well, it has helped his recurrent spells, he is now taking lamotrigine 150 mg daily, last visit with office was in October 2018 with [name].  Previous EMG nerve conduction study in November 2019 has confirmed length dependent moderate axonal sensorimotor polyneuropathy, consistent with his poorly controlled diabetes  He was able to ambulate without assistant until end of October 2020, woke up 1 morning, noticed deep achy pain at right anterior and medial time, radiating to right medial leg, and ankle, his pain gradually getting worse, 3 weeks later, he noticed gradual onset right leg weakness, to the point of difficulty picking up his right hip against gravity, at the same time, his right leg pain intensified,  He was seen by neurosurgeon [name], who referred him for EMG nerve conduction study by [name] on April 08, 2019, there is evidence of severe sensorimotor polyneuropathy, in addition, there is evidence of active denervation at right vastus lateralis, medialis, iliopsoas, adductor longus, but there was no paraspinal muscle active denervation's.  Above findings with the possibility of diabetic amyotrophy  Since his symptoms onset end of October 2020, he continue  complains of gradual worsening right thigh pain, right leg weakness, gait abnormality.  He is receiving home physical therapy  I was able to review his office visit [facility] on [facility] 20, [facility], he has been under pain management for extended period of time, previously taking fentanyl 75 mcg per 72 hours, Lyrica 150 mg 3 times daily, previously was a patient of [facility], when that office was closed, he seek pain management at [place], had his first right sacroiliac joint injection on February 20, 2019 by [name]   Personally reviewed MRIs from [facility] in February 2021 MRI of cervical spine mild degenerative changes, no significant canal or foraminal narrowing MRI of brain: No significant abnormality MRI of lumbar spine in November 2020 it was normal MRI of the brain October 20, 2018 that was normal  Laboratory evaluations in 2020, A1c was 10.3, normal CBC, BMP with exception of elevated glucose 298,  UPDATE Jul 28 2020: He is accompanied by his wife at today's clinical visit, he presented to emergency room on June 11, 2020 reported acute onset of dysarthria, right-sided weakness, numbness, involving face, arm, and leg, also complains of chest pain, shortness of breath, lightheadedness  He was treated as acute stroke, after CT head showed no significant abnormality, and no large vessel disease on CT angiogram of head and neck, he was giving IV tPA, ICU for close monitoring, MRI of the brain reviewed with patient, no acute abnormality  He was discharged home with Plavix 75 mg  He had transient hyponatremia, acute renal failure, improved with IV hydration  Today his main concern is bilateral feet paresthesia, pain, difficulty bearing weight, A1c was 11.4, is under the care of endocrinologist [name]  He has poor functional status, spent most of the time in sitting position, no recurrent seizure-like activity, also carries a diagnosis of bipolar  disorder, on polypharmacy treatment, including combination of gabapentin, and Lyrica 200 mg 3 times a day    PHYSICAL EXAM:   Vitals:   07/28/20 0718  BP: (!) 92/58  Pulse: 78  Weight: 204 lb (92.5 kg)  Height: 6' (1.829 m)   Not recorded     Body mass index is 27.67 kg/m.  PHYSICAL EXAMNIATION:  Gen: NAD, conversant, well nourised, well groomed                     Cardiovascular: Regular rate rhythm, no peripheral edema, warm, nontender. Eyes: Conjunctivae clear without exudates or hemorrhage Neck: Supple, no carotid bruits. Pulmonary: Clear to auscultation bilaterally   NEUROLOGICAL EXAM:  MENTAL STATUS: Speech/cognition: Tired looking middle-aged male, cooperative on questioning, and examination CRANIAL NERVES: CN II: Visual fields are full to confrontation. Pupils are round equal and briskly reactive to light. CN III, IV, VI: extraocular movement are normal. No ptosis. CN V: Facial sensation is intact to light touch CN VII: Face is symmetric with normal eye closure  CN VIII: Hearing is normal to causal conversation. CN IX, X: Phonation is normal. CN XI: Head turning and shoulder shrug are intact  MOTOR: There is no pronator drift of out-stretched arms. Muscle bulk and tone are normal. Muscle strength is normal.  REFLEXES: Reflexes are 1 and symmetric at the biceps, triceps, knees, and ankles. Plantar responses are flexor.  SENSORY: Mildly length dependent decreased to vibratory sensation, light touch, pinprick  COORDINATION: There is no trunk or limb dysmetria noted.  GAIT/STANCE: Need push-up to get up from seated position, steady and mild antalgic REVIEW OF SYSTEMS:  Full 14 system review of systems performed and notable only for as above All other review of systems were negative.   ALLERGIES: Allergies  Allergen Reactions  . Celebrex [Celecoxib] Anaphylaxis and Rash  . Hydrocodone Rash and Other (See Comments)    "blisters developed on arms"   . Sulfa Antibiotics Rash    HOME MEDICATIONS: Current Outpatient Medications  Medication Sig Dispense Refill  . clopidogrel (PLAVIX) 75 MG tablet Take 1 tablet (75 mg total) by mouth daily. 30 tablet 0  . Continuous Blood Gluc Sensor (FREESTYLE LIBRE 2 SENSOR) MISC 1 Device by Does not apply route every 14 (fourteen) days. (Patient taking differently: 1 Device by Does not apply route every 14 (fourteen) days. Has not received- waiting on Insurance) 6 each 3  . Dulaglutide (TRULICITY) 4.5 ZO/1.0RU SOPN Inject 4.5 mg into the skin once a week. (Patient taking differently: Inject 4.5 mg into the skin every Thursday.) 6 mL 3  . fenofibrate 160 MG tablet Take 160 mg by mouth daily.    Marland Kitchen gabapentin (NEURONTIN) 600 MG tablet Take 600 mg by mouth 2 (two) times daily.    . insulin glargine, 2 Unit Dial, (TOUJEO MAX SOLOSTAR) 300 UNIT/ML Solostar Pen Inject 180 Units into the skin every morning. And pen needles 1/day (Patient taking differently: Inject 180 Units into the skin in the morning.) 24 mL 11  . isosorbide mononitrate (IMDUR) 120 MG 24 hr tablet Take 1 tablet (120 mg total) by mouth daily. Please make annual appt in April with Dr. Percival Spanish for refills. Thank you (Patient taking differently: Take 120 mg by mouth daily.) 90 tablet 0  . lamoTRIgine (LAMICTAL) 150 MG tablet Take 1 tablet by mouth twice daily 60 tablet 0  . levothyroxine (SYNTHROID, LEVOTHROID) 25 MCG tablet Take 25 mcg by mouth daily before breakfast.     . lurasidone (LATUDA) 20 MG TABS tablet Take one tab daily for one week and than twice daily 60 tablet 0  . metFORMIN (GLUCOPHAGE-XR) 500 MG 24 hr tablet Take 500 mg by mouth 2 (two) times daily.    . metoCLOPramide (REGLAN) 10 MG tablet Take by mouth 3 (three) times daily with meals.    Marland Kitchen morphine (MS CONTIN) 60 MG 12 hr tablet Take 60 mg by mouth 2 (two) times daily. Every 12 hours    . morphine (MSIR) 15 MG tablet Take 15 mg by mouth 2 (two) times daily as needed. For  breakthrough pain  (03-26-2020 per pt wife pt takes this twice also    . omeprazole (PRILOSEC) 40 MG capsule Take 40 mg by mouth daily.    . Pancrelipase, Lip-Prot-Amyl, (ZENPEP) 5000-24000 units CPEP Take 1-2 capsules by mouth See admin instructions. Takes 1 capsule in the morning and 2 capsules at night    . pregabalin (LYRICA) 200 MG capsule Take 200 mg by mouth See admin instructions. Take 200 mg by mouth in the morning and 400 mg at night    . rosuvastatin (CRESTOR) 10 MG tablet Take 10 mg by mouth daily.    . sucralfate (CARAFATE) 1 g tablet Take 1 g by mouth 2 (two) times daily.    . tamsulosin (FLOMAX) 0.4 MG CAPS capsule Take 0.8 mg by mouth at bedtime.    . traZODone (DESYREL) 150 MG tablet Take 1 tablet (150 mg total) by mouth at  bedtime. 30 tablet 0  . venlafaxine XR (EFFEXOR-XR) 150 MG 24 hr capsule Take 1 capsule (150 mg total) by mouth daily with breakfast. 30 capsule 0  . Vitamin D, Ergocalciferol, (DRISDOL) 1.25 MG (50000 UNIT) CAPS capsule Take 50,000 Units by mouth every Wednesday.     No current facility-administered medications for this visit.    PAST MEDICAL HISTORY: Past Medical History:  Diagnosis Date  . Anxiety   . Benign essential tremor   . Bipolar 2 disorder ([place])    followed by [facility]--- dr s. Adele Schilder  . Bladder cancer ([facility])    recurrent  . CAD (coronary artery disease)    cardiac cath 2003  and 2011 both showed normal coronary arteries w/ preserved lvf;  Non obstructive on CTA Oct 2019.   Marland Kitchen Chronic pain syndrome    back---- [facility] [place]  . Cold extremities    BLE  . COPD (chronic obstructive pulmonary disease) ([place])   . DDD (degenerative disc disease), lumbar   . Diabetic peripheral neuropathy ([place])   . Gastroparesis    followed by dr Henrene Pastor  . GERD (gastroesophageal reflux disease)   . Hiatal hernia   . History of bladder cancer urologist-  previously dr Consuella Lose;  now dr gay   papillay TCC (Ta G1)  s/p TURBT and chemo  instillation 2014  . History of chest pain 12/2017   heart cath normal  . History of encephalopathy 05/27/2015   admission w/ acute encephalopathy thought to be secondary to pain meds and COPD  . History of gastric ulcer   . History of Helicobacter pylori infection   . History of kidney stones   . History of TIA (transient ischemic attack) 2008  and 10-19-2018    no residual's  . History of traumatic head injury 2010   w/ LOC  per pt needed stitches, hit in head with a mower blade  . Hyperlipidemia   . Hypertension   . Hypogonadism male    s/p  bilateral orchiectomy  . Hypothyroidism   . Insomnia   . Mild obstructive sleep apnea    study in epic 12-04-2016, no cpap  . PTSD (post-traumatic stress disorder)    chronic  . PTSD (post-traumatic stress disorder)   . RA (rheumatoid arthritis) ([place])    followed by [facility]  . Seizures, transient [facility]) neurologist-  dr Krista Blue--  differential dx complex partial seizure .vs.  mood disorder .vs.  pseudoseizure--  negative EEG's   confusion episodes and staring spells since 11/ 2015   (03-26-2020 per pt wife last seizure 10 /2021)  . Transient confusion NEUOROLOGIST-  DR Sundai Probert   Episodes since 11/ 2015--  neurologist dx  differential complex partial seizure  .vs. mood disorder . vs. pseudoseizure  . Type 2 diabetes mellitus treated with insulin [facility])    endocrinologist--- dr Loanne Drilling---  (03-26-2020 pt does not check blood sugar at home)    PAST SURGICAL HISTORY: Past Surgical History:  Procedure Laterality Date  . AMPUTATION Left 04/28/2020   Procedure: LEFT LITTLE FINGER AMPUTATION;  Surgeon: Newt Minion, MD;  Location: [place];  Service: Orthopedics;  Laterality: Left;  . CARDIAC CATHETERIZATION  12-27-2001  DR Einar Gip  &  05-26-2009  DR Irish Lack   RESULTS FOR BOTH ARE NORMAL CORONARIES AND PERSERVED LVF/ EF 60%  . CARPAL TUNNEL RELEASE Bilateral right 09-16-2003;  left ?  . CARPAL TUNNEL RELEASE Left 02/25/2015   Procedure:  LEFT CARPAL TUNNEL RELEASE;  Surgeon: Leanora Cover, MD;  Location: [place];  Service: Orthopedics;  Laterality: Left;  . CYSTOSCOPY N/A 10/10/2012   Procedure: CYSTOSCOPY CLOT EVACUATION FULGERATION OF BLEEDERS ;  Surgeon: Claybon Jabs, MD;  Location: [facility];  Service: Urology;  Laterality: N/A;  . CYSTOSCOPY WITH BIOPSY N/A 11/26/2015   Procedure: CYSTOSCOPY WITH BIOPSY AND FULGURATION;  Surgeon: Kathie Rhodes, MD;  Location: [place];  Service: Urology;  Laterality: N/A;  . ESOPHAGOGASTRODUODENOSCOPY  2014  . LAPAROSCOPIC CHOLECYSTECTOMY  11-17-2010  . ORCHIECTOMY Right 02/21/2016   Procedure: SCROTAL ORCHIECTOMY with TESTICULAR PROSTHESIS IMPLANT;  Surgeon: Kathie Rhodes, MD;  Location: [facility];  Service: Urology;  Laterality: Right;  . ORCHIECTOMY Left 09/02/2018   Procedure: ORCHIECTOMY;  Surgeon: Kathie Rhodes, MD;  Location[facility];  Service: Urology;  Laterality: Left;  . ROTATOR CUFF REPAIR Right 12/2004  . TRANSURETHRAL RESECTION OF BLADDER TUMOR N/A 08/09/2012   Procedure: TRANSURETHRAL RESECTION OF BLADDER TUMOR (TURBT) WITH GYRUS WITH MITOMYCIN C;  Surgeon: Claybon Jabs, MD;  Location: [facility];  Service: Urology;  Laterality: N/A;  . TRANSURETHRAL RESECTION OF BLADDER TUMOR N/A 03/29/2020   Procedure: TRANSURETHRAL RESECTION OF BLADDER TUMOR (TURBT) and post-op instillation of gemcitabine;  Surgeon: Janith Lima, MD;  Location: [facility];  Service: Urology;  Laterality: N/A;  . TRANSURETHRAL RESECTION OF BLADDER TUMOR WITH GYRUS (TURBT-GYRUS) N/A 02/27/2014   Procedure: TRANSURETHRAL RESECTION OF BLADDER TUMOR WITH GYRUS (TURBT-GYRUS);  Surgeon: Claybon Jabs, MD;  Location: [facility];  Service: Urology;  Laterality: N/A;    FAMILY HISTORY: Family History  Problem Relation Age of Onset  . Diabetes Mother   . Diabetes Father   .  Hypertension Father   . Heart attack Father 61       died age 25  . Alcohol abuse Father   . Colon cancer Neg Hx   . Esophageal cancer Neg Hx   . Stomach cancer Neg Hx   . Rectal cancer Neg Hx     SOCIAL HISTORY: Social History   Socioeconomic History  . Marital status: Married    Spouse name: Not on file  . Number of children: 3  . Years of education: GED  . Highest education level: Not on file  Occupational History  . Occupation: Engineer, technical sales    Comment: Owner of company  Tobacco Use  . Smoking status: Current Every Day Smoker    Packs/day: 1.50    Years: 38.00    Pack years: 57.00    Types: Cigarettes  . Smokeless tobacco: Never Used  Vaping Use  . Vaping Use: Never used  Substance and Sexual Activity  . Alcohol use: No  . Drug use: No  . Sexual activity: Not Currently    Partners: Female    Birth control/protection: None  Other Topics Concern  . Not on file  Social History Narrative   Lives at home with his wife and children.   Left-handed.   3-4 cups caffeine per day.   Social Determinants of Health   Financial Resource Strain: Not on file  Food Insecurity: Not on file  Transportation Needs: Not on file  Physical Activity: Not on file  Stress: Not on file  Social Connections: Not on file  Intimate Partner Violence: Not on file      Marcial Pacas, M.D. Ph.D.  [facility] [address] [place], [place] [address]  Ph: [phone] Fax: [phone]  CC:  Hayden Rasmussen, MD [place],  [place] [address]  Hayden Rasmussen, MD

## 2020-07-28 NOTE — Addendum Note (Signed)
Addended by: Desmond Lope on: 07/28/2020 05:45 PM   Modules accepted: Orders

## 2020-08-04 ENCOUNTER — Other Ambulatory Visit (HOSPITAL_COMMUNITY): Payer: Self-pay | Admitting: Psychiatry

## 2020-08-04 DIAGNOSIS — F4312 Post-traumatic stress disorder, chronic: Secondary | ICD-10-CM

## 2020-08-06 ENCOUNTER — Encounter (HOSPITAL_COMMUNITY): Payer: Self-pay | Admitting: Psychiatry

## 2020-08-06 ENCOUNTER — Other Ambulatory Visit: Payer: Self-pay

## 2020-08-06 ENCOUNTER — Telehealth (HOSPITAL_COMMUNITY): Payer: Self-pay | Admitting: *Deleted

## 2020-08-06 ENCOUNTER — Telehealth (INDEPENDENT_AMBULATORY_CARE_PROVIDER_SITE_OTHER): Payer: No Typology Code available for payment source | Admitting: Psychiatry

## 2020-08-06 DIAGNOSIS — F4312 Post-traumatic stress disorder, chronic: Secondary | ICD-10-CM

## 2020-08-06 DIAGNOSIS — F3181 Bipolar II disorder: Secondary | ICD-10-CM

## 2020-08-06 MED ORDER — LATUDA 20 MG PO TABS: 20.0000 mg | ORAL_TABLET | Freq: Two times a day (BID) | ORAL | 2 refills | Status: DC

## 2020-08-06 MED ORDER — LATUDA 20 MG PO TABS: 20.0000 mg | ORAL_TABLET | Freq: Two times a day (BID) | ORAL | 0 refills | Status: DC

## 2020-08-06 MED ORDER — TRAZODONE HCL 150 MG PO TABS: 150.0000 mg | ORAL_TABLET | Freq: Every day | ORAL | 2 refills | Status: DC

## 2020-08-06 NOTE — Telephone Encounter (Signed)
Pt wife came to office to pick up 1 week supply of Latuda 20 mg to be taken BID. PA submitted via [facility].

## 2020-08-06 NOTE — Progress Notes (Signed)
Virtual Visit via Telephone Note  I connected with Vincent [name] on 08/06/20 at  8:40 AM EDT by telephone and verified that I am speaking with the correct person using two identifiers.  Location: Patient: Home Provider: Home Office   I discussed the limitations, risks, security and privacy concerns of performing an evaluation and management service by telephone and the availability of in person appointments. I also discussed with the patient that there may be a patient responsible charge related to this service. The patient expressed understanding and agreed to proceed.   History of Present Illness: Patient is evaluated by phone session.  On the last visit we started him on Latuda 20 mg twice a day as he continues to struggle with irritability, mood swings, anger and racing thoughts.  We had recommended to stop Abilify which was not working.  Patient noticed much improvement in his mood, irritability and anger.  He is sleeping at least 7 hours.  He is more active and denies any suicidal thoughts.  He is also taking trazodone which is helping his sleep.  Recently he had a visit with his neurologist.  Patient has multiple health issues including peripheral neuropathy, seizure disorder, diabetes, stroke, hypertension and COPD.  He is given by neurologist Effexor, Lyrica and recently Cymbalta to help his neuropathy.  He also taking Lamictal for seizure disorder.  Patient feels adding Latuda to help his nightmares, flashback.  He lives with his wife who is supportive.  He also lost few pounds and he is checking his blood sugar every day which he feels much better than before.  We have provided samples of Latuda and he needs more samples until his insurance approved.  Past Psychiatric History:Reviewed. H/Oone brief hospitalization at [facility] due to suicidal thoughts. No h/osuicidal attempt. H/Oanger, anxiety, PTSD and heavy substance use. Claims to be sober forwhile.Tried Depakote,  Zoloft, lithium, Prozac and Xanax.[name] helped but stopped due to polypharmacy.   Psychiatric Specialty Exam: Physical Exam  Review of Systems  Weight 204 lb (92.5 kg).Body mass index is 27.67 kg/m.  General Appearance: NA  Eye Contact:  NA  Speech:  Slow  Volume:  Decreased  Mood:  Euthymic  Affect:  NA  Thought Process:  Descriptions of Associations: Intact  Orientation:  Full (Time, Place, and Person)  Thought Content:  WDL  Suicidal Thoughts:  No  Homicidal Thoughts:  No  Memory:  Immediate;   Fair Recent;   Fair Remote;   Fair  Judgement:  Intact  Insight:  Shallow  Psychomotor Activity:  NA  Concentration:  Concentration: Fair and Attention Span: Fair  Recall:  [employer] of Knowledge:  Fair  Language:  Fair  Akathisia:  No  Handed:  Right  AIMS (if indicated):     Assets:  Communication Skills Desire for Improvement Housing Social Support  ADL's:  Intact  Cognition:  WNL  Sleep:   7 hrs      Assessment and Plan: Bipolar disorder type II.  PTSD.  Patient doing much better with addition of Latuda.  He noticed his irritability, severe anger and mood swings are much better and he is sleeping better.  He like to keep the Latuda at present dose which is 20 mg twice a day and trazodone 150 mg at bedtime.  I discussed polypharmacy as patient taking Lyrica, Lamictal, morphine along with Effexor, Cymbalta.  I recommend he can try reducing the dose of Effexor if Cymbalta added by the neurologist.  He agreed to  give a try.  We will provide Latuda samples and also call prescription to the pharmacy.  Patient is not interested in therapy.  Recommended to call us back if there is any question or any concern.  Follow-up in 3 months.  Follow Up Instructions:    I discussed the assessment and treatment plan with the patient. The patient was provided an opportunity to ask questions and all were answered. The patient agreed with the plan and demonstrated an understanding of the  instructions.   The patient was advised to call back or seek an in-person evaluation if the symptoms worsen or if the condition fails to improve as anticipated.  I provided 16 minutes of non-face-to-face time during this encounter.   Kathlee Nations, MD

## 2020-08-12 ENCOUNTER — Other Ambulatory Visit (HOSPITAL_COMMUNITY): Payer: Self-pay | Admitting: *Deleted

## 2020-08-12 ENCOUNTER — Telehealth (HOSPITAL_COMMUNITY): Payer: Self-pay | Admitting: *Deleted

## 2020-08-12 NOTE — Telephone Encounter (Signed)
PA submitted via CoverMyMeds for Latuda 20 mg. Awaiting determination.

## 2020-08-13 ENCOUNTER — Telehealth (HOSPITAL_COMMUNITY): Payer: Self-pay | Admitting: *Deleted

## 2020-08-13 NOTE — Telephone Encounter (Signed)
PA for Latuda 20 mg denied by [employer] via CoverMyMeds due to not having tried and failed all of the following medications; Geodon, Abilify, Risperdal, Zyprexa, and Seroquel.

## 2020-08-16 NOTE — Telephone Encounter (Signed)
Ok will appeal.

## 2020-08-16 NOTE — Telephone Encounter (Signed)
We cannot tried Zyprexa, Seroquel, Risperdal and Abilify due to very high blood sugar.  His hemoglobin A1c usually above 10.

## 2020-08-18 ENCOUNTER — Telehealth (HOSPITAL_COMMUNITY): Payer: Self-pay | Admitting: *Deleted

## 2020-08-18 NOTE — Telephone Encounter (Signed)
PA redetermination on Latuda 20 mg #30 approved via [employer] thru 08/16/21.

## 2020-08-19 NOTE — Telephone Encounter (Signed)
Thanks

## 2020-08-23 ENCOUNTER — Other Ambulatory Visit (HOSPITAL_COMMUNITY): Payer: Self-pay | Admitting: *Deleted

## 2020-08-23 DIAGNOSIS — F3181 Bipolar II disorder: Secondary | ICD-10-CM

## 2020-08-23 MED ORDER — LATUDA 20 MG PO TABS: ORAL_TABLET | ORAL | 2 refills | Status: DC

## 2020-08-25 ENCOUNTER — Other Ambulatory Visit (HOSPITAL_COMMUNITY): Payer: Self-pay | Admitting: *Deleted

## 2020-08-25 ENCOUNTER — Telehealth (HOSPITAL_COMMUNITY): Payer: Self-pay | Admitting: *Deleted

## 2020-08-25 DIAGNOSIS — F3181 Bipolar II disorder: Secondary | ICD-10-CM

## 2020-08-25 MED ORDER — LURASIDONE HCL 40 MG PO TABS: 40.0000 mg | ORAL_TABLET | Freq: Every day | ORAL | 2 refills | Status: DC

## 2020-08-25 NOTE — Telephone Encounter (Signed)
Received fax from pt pharmacy, [facility] , notifying that there is a  DDI with the [facility]. Pharmacy verified that pt is still taking Reglan. Please review and advise.

## 2020-08-25 NOTE — Telephone Encounter (Signed)
Reglan is used for GI motility.  It can cause worsening of EPS, tremors and need to be used cautiously with any antipsychotic medication.  If patient does not have any worsening tremors, shakes or any EPS that he can keep it together however otherwise switch Reglan to something else for his gastric motility.  We have tried olanzapine, quetiapine, Abilify and Risperdal but due to high blood sugar and poor response they were discontinued.

## 2020-11-01 ENCOUNTER — Other Ambulatory Visit (HOSPITAL_COMMUNITY): Payer: Self-pay | Admitting: Psychiatry

## 2020-11-01 DIAGNOSIS — F4312 Post-traumatic stress disorder, chronic: Secondary | ICD-10-CM

## 2020-11-08 ENCOUNTER — Other Ambulatory Visit (HOSPITAL_COMMUNITY): Payer: Self-pay | Admitting: *Deleted

## 2020-11-08 ENCOUNTER — Telehealth (HOSPITAL_COMMUNITY): Payer: No Typology Code available for payment source | Admitting: Psychiatry

## 2020-11-08 DIAGNOSIS — F4312 Post-traumatic stress disorder, chronic: Secondary | ICD-10-CM

## 2020-11-08 MED ORDER — TRAZODONE HCL 150 MG PO TABS: 150.0000 mg | ORAL_TABLET | Freq: Every day | ORAL | 0 refills | Status: DC

## 2020-11-14 ENCOUNTER — Observation Stay (HOSPITAL_COMMUNITY)
Admission: EM | Admit: 2020-11-14 | Discharge: 2020-11-16 | Disposition: A | Discharge status: Home / Self Care | Payer: No Typology Code available for payment source | Attending: Family Medicine | Admitting: Family Medicine

## 2020-11-14 ENCOUNTER — Encounter (HOSPITAL_COMMUNITY): Payer: Self-pay | Admitting: Emergency Medicine

## 2020-11-14 ENCOUNTER — Emergency Department (HOSPITAL_COMMUNITY): Payer: No Typology Code available for payment source

## 2020-11-14 ENCOUNTER — Other Ambulatory Visit: Payer: Self-pay

## 2020-11-14 DIAGNOSIS — Z8673 Personal history of transient ischemic attack (TIA), and cerebral infarction without residual deficits: Secondary | ICD-10-CM | POA: Insufficient documentation

## 2020-11-14 DIAGNOSIS — G894 Chronic pain syndrome: Secondary | ICD-10-CM

## 2020-11-14 DIAGNOSIS — J449 Chronic obstructive pulmonary disease, unspecified: Secondary | ICD-10-CM | POA: Insufficient documentation

## 2020-11-14 DIAGNOSIS — E785 Hyperlipidemia, unspecified: Secondary | ICD-10-CM | POA: Diagnosis not present

## 2020-11-14 DIAGNOSIS — E119 Type 2 diabetes mellitus without complications: Secondary | ICD-10-CM | POA: Diagnosis not present

## 2020-11-14 DIAGNOSIS — E875 Hyperkalemia: Secondary | ICD-10-CM | POA: Diagnosis present

## 2020-11-14 DIAGNOSIS — Z794 Long term (current) use of insulin: Secondary | ICD-10-CM | POA: Diagnosis not present

## 2020-11-14 DIAGNOSIS — E039 Hypothyroidism, unspecified: Secondary | ICD-10-CM | POA: Diagnosis not present

## 2020-11-14 DIAGNOSIS — N1832 Chronic kidney disease, stage 3b: Secondary | ICD-10-CM | POA: Diagnosis present

## 2020-11-14 DIAGNOSIS — Z79899 Other long term (current) drug therapy: Secondary | ICD-10-CM | POA: Insufficient documentation

## 2020-11-14 DIAGNOSIS — I6330 Cerebral infarction due to thrombosis of unspecified cerebral artery: Secondary | ICD-10-CM | POA: Insufficient documentation

## 2020-11-14 DIAGNOSIS — Z7902 Long term (current) use of antithrombotics/antiplatelets: Secondary | ICD-10-CM | POA: Diagnosis not present

## 2020-11-14 DIAGNOSIS — F17200 Nicotine dependence, unspecified, uncomplicated: Secondary | ICD-10-CM

## 2020-11-14 DIAGNOSIS — E1142 Type 2 diabetes mellitus with diabetic polyneuropathy: Secondary | ICD-10-CM | POA: Diagnosis not present

## 2020-11-14 DIAGNOSIS — R531 Weakness: Principal | ICD-10-CM | POA: Insufficient documentation

## 2020-11-14 DIAGNOSIS — I10 Essential (primary) hypertension: Secondary | ICD-10-CM | POA: Insufficient documentation

## 2020-11-14 DIAGNOSIS — N189 Chronic kidney disease, unspecified: Secondary | ICD-10-CM | POA: Diagnosis present

## 2020-11-14 DIAGNOSIS — I2510 Atherosclerotic heart disease of native coronary artery without angina pectoris: Secondary | ICD-10-CM | POA: Insufficient documentation

## 2020-11-14 DIAGNOSIS — N179 Acute kidney failure, unspecified: Secondary | ICD-10-CM | POA: Diagnosis not present

## 2020-11-14 DIAGNOSIS — F17210 Nicotine dependence, cigarettes, uncomplicated: Secondary | ICD-10-CM | POA: Diagnosis not present

## 2020-11-14 DIAGNOSIS — Z720 Tobacco use: Secondary | ICD-10-CM | POA: Diagnosis present

## 2020-11-14 DIAGNOSIS — E1165 Type 2 diabetes mellitus with hyperglycemia: Secondary | ICD-10-CM | POA: Diagnosis present

## 2020-11-14 DIAGNOSIS — G8929 Other chronic pain: Secondary | ICD-10-CM | POA: Diagnosis present

## 2020-11-14 DIAGNOSIS — Z7984 Long term (current) use of oral hypoglycemic drugs: Secondary | ICD-10-CM | POA: Insufficient documentation

## 2020-11-14 DIAGNOSIS — R29898 Other symptoms and signs involving the musculoskeletal system: Secondary | ICD-10-CM

## 2020-11-14 DIAGNOSIS — Z8551 Personal history of malignant neoplasm of bladder: Secondary | ICD-10-CM | POA: Diagnosis not present

## 2020-11-14 DIAGNOSIS — R2990 Unspecified symptoms and signs involving the nervous system: Secondary | ICD-10-CM | POA: Diagnosis present

## 2020-11-14 LAB — CBG MONITORING, ED
Glucose-Capillary: 473 mg/dL — ABNORMAL HIGH (ref 70–99)
Glucose-Capillary: 595 mg/dL (ref 70–99)
Glucose-Capillary: 169 mg/dL — ABNORMAL HIGH (ref 70–99)

## 2020-11-14 LAB — CBC
HCT: 36.4 % — ABNORMAL LOW (ref 39.0–52.0)
Hemoglobin: 12.3 g/dL — ABNORMAL LOW (ref 13.0–17.0)
MCH: 28.3 pg (ref 26.0–34.0)
MCHC: 33.8 g/dL (ref 30.0–36.0)
MCV: 83.9 fL (ref 80.0–100.0)
Platelets: 205 10*3/uL (ref 150–400)
RBC: 4.34 MIL/uL (ref 4.22–5.81)
RDW: 12.1 % (ref 11.5–15.5)
WBC: 7.3 10*3/uL (ref 4.0–10.5)
nRBC: 0 % (ref 0.0–0.2)

## 2020-11-14 LAB — DIFFERENTIAL
Abs Immature Granulocytes: 0.1 10*3/uL — ABNORMAL HIGH (ref 0.00–0.07)
Basophils Absolute: 0.1 10*3/uL (ref 0.0–0.1)
Basophils Relative: 1 %
Eosinophils Absolute: 0.2 10*3/uL (ref 0.0–0.5)
Eosinophils Relative: 2 %
Immature Granulocytes: 1 %
Lymphocytes Relative: 38 %
Lymphs Abs: 2.8 10*3/uL (ref 0.7–4.0)
Monocytes Absolute: 0.5 10*3/uL (ref 0.1–1.0)
Monocytes Relative: 7 %
Neutro Abs: 3.8 10*3/uL (ref 1.7–7.7)
Neutrophils Relative %: 51 %

## 2020-11-14 LAB — COMPREHENSIVE METABOLIC PANEL
ALT: 16 U/L (ref 0–44)
AST: 13 U/L — ABNORMAL LOW (ref 15–41)
Albumin: 3.7 g/dL (ref 3.5–5.0)
Alkaline Phosphatase: 67 U/L (ref 38–126)
Anion gap: 8 (ref 5–15)
BUN: 28 mg/dL — ABNORMAL HIGH (ref 6–20)
CO2: 26 mmol/L (ref 22–32)
Calcium: 9.2 mg/dL (ref 8.9–10.3)
Chloride: 96 mmol/L — ABNORMAL LOW (ref 98–111)
Creatinine, Ser: 1.86 mg/dL — ABNORMAL HIGH (ref 0.61–1.24)
GFR, Estimated: 43 mL/min — ABNORMAL LOW (ref >60)
Glucose, Bld: 542 mg/dL (ref 70–99)
Potassium: 5.2 mmol/L — ABNORMAL HIGH (ref 3.5–5.1)
Sodium: 130 mmol/L — ABNORMAL LOW (ref 135–145)
Total Bilirubin: 0.3 mg/dL (ref 0.3–1.2)
Total Protein: 6.5 g/dL (ref 6.5–8.1)

## 2020-11-14 LAB — GLUCOSE, CAPILLARY
Glucose-Capillary: 403 mg/dL — ABNORMAL HIGH (ref 70–99)
Glucose-Capillary: 405 mg/dL — ABNORMAL HIGH (ref 70–99)

## 2020-11-14 LAB — HEMOGLOBIN A1C
Hgb A1c MFr Bld: 11.8 % — ABNORMAL HIGH (ref 4.8–5.6)
Mean Plasma Glucose: 291.96 mg/dL

## 2020-11-14 LAB — APTT: aPTT: 31 seconds (ref 24–36)

## 2020-11-14 LAB — TSH: TSH: 1.831 u[IU]/mL (ref 0.350–4.500)

## 2020-11-14 LAB — PROTIME-INR
INR: 0.9 (ref 0.8–1.2)
Prothrombin Time: 12.3 seconds (ref 11.4–15.2)

## 2020-11-14 LAB — TROPONIN I (HIGH SENSITIVITY)
Troponin I (High Sensitivity): 3 ng/L (ref <18)
Troponin I (High Sensitivity): 3 ng/L (ref <18)

## 2020-11-14 MED ORDER — PANCRELIPASE (LIP-PROT-AMYL) 36000-114000 UNITS PO CPEP
48000.0000 [IU] | ORAL_CAPSULE | Freq: Every day | ORAL | Status: DC
  Filled 2020-11-14: qty 1

## 2020-11-14 MED ORDER — SODIUM CHLORIDE 0.9 % IV SOLN: INTRAVENOUS | Status: DC

## 2020-11-14 MED ORDER — MORPHINE SULFATE ER 15 MG PO TBCR
60.0000 mg | EXTENDED_RELEASE_TABLET | Freq: Two times a day (BID) | ORAL | Status: DC
  Administered 2020-11-14 – 2020-11-16 (×4): 60 mg via ORAL
  Filled 2020-11-14 (×4): qty 4

## 2020-11-14 MED ORDER — VENLAFAXINE HCL ER 150 MG PO CP24
150.0000 mg | ORAL_CAPSULE | Freq: Every day | ORAL | Status: DC
  Administered 2020-11-15 – 2020-11-16 (×2): 150 mg via ORAL
  Filled 2020-11-14 (×2): qty 1

## 2020-11-14 MED ORDER — LURASIDONE HCL 40 MG PO TABS
40.0000 mg | ORAL_TABLET | Freq: Every day | ORAL | Status: DC
  Administered 2020-11-15 – 2020-11-16 (×2): 40 mg via ORAL
  Filled 2020-11-14 (×2): qty 1

## 2020-11-14 MED ORDER — ACETAMINOPHEN 160 MG/5ML PO SOLN: 650.0000 mg | ORAL | Status: DC | PRN

## 2020-11-14 MED ORDER — PREGABALIN 25 MG PO CAPS
450.0000 mg | ORAL_CAPSULE | Freq: Every day | ORAL | Status: DC
  Administered 2020-11-14 – 2020-11-15 (×2): 450 mg via ORAL
  Filled 2020-11-14: qty 2

## 2020-11-14 MED ORDER — PANCRELIPASE (LIP-PROT-AMYL) 12000-38000 UNITS PO CPEP
24000.0000 [IU] | ORAL_CAPSULE | Freq: Every day | ORAL | Status: DC
  Filled 2020-11-14 (×2): qty 2

## 2020-11-14 MED ORDER — DULOXETINE HCL 60 MG PO CPEP
60.0000 mg | ORAL_CAPSULE | Freq: Every day | ORAL | Status: DC
  Administered 2020-11-14 – 2020-11-16 (×3): 60 mg via ORAL
  Filled 2020-11-14 (×3): qty 1

## 2020-11-14 MED ORDER — LAMOTRIGINE 150 MG PO TABS
150.0000 mg | ORAL_TABLET | Freq: Two times a day (BID) | ORAL | Status: DC
  Administered 2020-11-14 – 2020-11-16 (×4): 150 mg via ORAL
  Filled 2020-11-14 (×6): qty 1

## 2020-11-14 MED ORDER — LEVOTHYROXINE SODIUM 25 MCG PO TABS
25.0000 ug | ORAL_TABLET | Freq: Every day | ORAL | Status: DC
  Administered 2020-11-15 – 2020-11-16 (×2): 25 ug via ORAL
  Filled 2020-11-14 (×2): qty 1

## 2020-11-14 MED ORDER — INSULIN ASPART 100 UNIT/ML IJ SOLN
10.0000 [IU] | Freq: Once | INTRAMUSCULAR | Status: AC
  Administered 2020-11-14: 10 [IU] via INTRAVENOUS

## 2020-11-14 MED ORDER — TRAZODONE HCL 50 MG PO TABS
150.0000 mg | ORAL_TABLET | Freq: Every day | ORAL | Status: DC
  Administered 2020-11-14 – 2020-11-15 (×2): 150 mg via ORAL
  Filled 2020-11-14 (×2): qty 1

## 2020-11-14 MED ORDER — ISOSORBIDE MONONITRATE ER 60 MG PO TB24
120.0000 mg | ORAL_TABLET | Freq: Every day | ORAL | Status: DC
  Administered 2020-11-14 – 2020-11-16 (×3): 120 mg via ORAL
  Filled 2020-11-14 (×3): qty 2

## 2020-11-14 MED ORDER — ENOXAPARIN SODIUM 40 MG/0.4ML IJ SOSY
40.0000 mg | PREFILLED_SYRINGE | INTRAMUSCULAR | Status: DC
  Administered 2020-11-14 – 2020-11-15 (×2): 40 mg via SUBCUTANEOUS
  Filled 2020-11-14 (×2): qty 0.4

## 2020-11-14 MED ORDER — INSULIN ASPART 100 UNIT/ML IJ SOLN
10.0000 [IU] | Freq: Once | INTRAMUSCULAR | Status: AC
  Administered 2020-11-14: 10 [IU] via SUBCUTANEOUS

## 2020-11-14 MED ORDER — PANTOPRAZOLE SODIUM 40 MG PO TBEC
40.0000 mg | DELAYED_RELEASE_TABLET | Freq: Every day | ORAL | Status: DC
  Administered 2020-11-15 – 2020-11-16 (×2): 40 mg via ORAL
  Filled 2020-11-14 (×2): qty 1

## 2020-11-14 MED ORDER — SUCRALFATE 1 G PO TABS
1.0000 g | ORAL_TABLET | Freq: Two times a day (BID) | ORAL | Status: DC
  Administered 2020-11-14 – 2020-11-16 (×4): 1 g via ORAL
  Filled 2020-11-14 (×4): qty 1

## 2020-11-14 MED ORDER — CLOPIDOGREL BISULFATE 75 MG PO TABS
75.0000 mg | ORAL_TABLET | Freq: Every day | ORAL | Status: DC
  Administered 2020-11-15 – 2020-11-16 (×2): 75 mg via ORAL
  Filled 2020-11-14 (×2): qty 1

## 2020-11-14 MED ORDER — STROKE: EARLY STAGES OF RECOVERY BOOK
Freq: Once | Status: AC
  Filled 2020-11-14: qty 1

## 2020-11-14 MED ORDER — ACETAMINOPHEN 650 MG RE SUPP: 650.0000 mg | RECTAL | Status: DC | PRN

## 2020-11-14 MED ORDER — ROSUVASTATIN CALCIUM 5 MG PO TABS
10.0000 mg | ORAL_TABLET | Freq: Every day | ORAL | Status: DC
  Administered 2020-11-15: 10 mg via ORAL
  Filled 2020-11-14: qty 2

## 2020-11-14 MED ORDER — PREGABALIN 25 MG PO CAPS
225.0000 mg | ORAL_CAPSULE | Freq: Every morning | ORAL | Status: DC
  Administered 2020-11-15 – 2020-11-16 (×2): 225 mg via ORAL
  Filled 2020-11-14 (×2): qty 1

## 2020-11-14 MED ORDER — SODIUM CHLORIDE 0.9% FLUSH
3.0000 mL | Freq: Once | INTRAVENOUS | Status: AC
  Administered 2020-11-14: 3 mL via INTRAVENOUS

## 2020-11-14 MED ORDER — ACETAMINOPHEN 325 MG PO TABS: 650.0000 mg | ORAL_TABLET | ORAL | Status: DC | PRN

## 2020-11-14 MED ORDER — MORPHINE SULFATE 15 MG PO TABS: 15.0000 mg | ORAL_TABLET | Freq: Two times a day (BID) | ORAL | Status: DC | PRN

## 2020-11-14 MED ORDER — NICOTINE 21 MG/24HR TD PT24
21.0000 mg | MEDICATED_PATCH | Freq: Every day | TRANSDERMAL | Status: DC
  Administered 2020-11-14 – 2020-11-16 (×3): 21 mg via TRANSDERMAL
  Filled 2020-11-14 (×3): qty 1

## 2020-11-14 MED ORDER — PREGABALIN 225 MG PO CAPS: 225.0000 mg | ORAL_CAPSULE | Freq: Two times a day (BID) | ORAL | Status: DC

## 2020-11-14 MED ORDER — INSULIN GLARGINE-YFGN 100 UNIT/ML ~~LOC~~ SOLN
10.0000 [IU] | Freq: Two times a day (BID) | SUBCUTANEOUS | Status: DC
  Administered 2020-11-14: 10 [IU] via SUBCUTANEOUS
  Filled 2020-11-14 (×5): qty 0.1

## 2020-11-14 MED ORDER — PANCRELIPASE (LIP-PROT-AMYL) 5000-24000 UNITS PO CPEP: 1.0000 | ORAL_CAPSULE | ORAL | Status: DC

## 2020-11-14 MED ORDER — TAMSULOSIN HCL 0.4 MG PO CAPS
0.8000 mg | ORAL_CAPSULE | Freq: Every day | ORAL | Status: DC
  Administered 2020-11-15: 0.8 mg via ORAL
  Filled 2020-11-14: qty 2

## 2020-11-14 MED ORDER — INSULIN ASPART 100 UNIT/ML IJ SOLN
0.0000 [IU] | Freq: Three times a day (TID) | INTRAMUSCULAR | Status: DC
  Administered 2020-11-15 (×2): 15 [IU] via SUBCUTANEOUS
  Administered 2020-11-15: 8 [IU] via SUBCUTANEOUS

## 2020-11-14 MED ORDER — PREGABALIN 25 MG PO CAPS: 225.0000 mg | ORAL_CAPSULE | Freq: Every day | ORAL | Status: DC

## 2020-11-14 MED ORDER — FENOFIBRATE 160 MG PO TABS
160.0000 mg | ORAL_TABLET | Freq: Every day | ORAL | Status: DC
  Administered 2020-11-14 – 2020-11-16 (×3): 160 mg via ORAL
  Filled 2020-11-14 (×3): qty 1

## 2020-11-14 MED ORDER — METOCLOPRAMIDE HCL 5 MG PO TABS
10.0000 mg | ORAL_TABLET | Freq: Three times a day (TID) | ORAL | Status: DC
  Administered 2020-11-15 – 2020-11-16 (×4): 10 mg via ORAL
  Filled 2020-11-14 (×4): qty 2

## 2020-11-14 NOTE — ED Notes (Signed)
Pt transported to MRI via stretcher at this time.  °

## 2020-11-14 NOTE — ED Notes (Signed)
Pt returned from MRI °

## 2020-11-14 NOTE — ED Provider Notes (Addendum)
[facility] EMERGENCY DEPARTMENT Provider Note   CSN: [id number] Arrival date & time: 11/14/20  1353     History Chief Complaint  Patient presents with   Hyperglycemia   Weakness    Vincent [name] is a 55 y.o. male.  Pt is a 55 yo male with pmh of ischemic stroke in 06/12/2020 receiving TPA presenting for stroke like symptoms. Pt admits to left arm weakness and left leg weakness that was discovered upon waking at 3 AM this morning. Pt states he went back to sleep and had worsening of symptoms at 9AM this morning so he went to urgent care who sent him here. Denies speech changes, facial assymtry, or dizziness. Admits to improved but continued left upper and left lower extremity weakness and sensation changes.   The history is provided by the patient. No language interpreter was used.  Cerebrovascular Accident This is a new problem. The problem occurs constantly. The problem has been gradually improving. Pertinent negatives include no chest pain, no abdominal pain and no shortness of breath. Nothing aggravates the symptoms.      Past Medical History:  Diagnosis Date   Anxiety    Benign essential tremor    Bipolar 2 disorder [facility])    followed by [facility]--- dr s. Adele Schilder   Bladder cancer [facility])    recurrent   CAD (coronary artery disease)    cardiac cath 2003  and 2011 both showed normal coronary arteries w/ preserved lvf;  Non obstructive on CTA Oct 2019.    Chronic pain syndrome    back---- [facility] [place]   Cold extremities    BLE   COPD (chronic obstructive pulmonary disease) ([place])    DDD (degenerative disc disease), lumbar    Diabetic peripheral neuropathy ([place])    Gastroparesis    followed by dr Henrene Pastor   GERD (gastroesophageal reflux disease)    Hiatal hernia    History of bladder cancer urologist-  previously dr Consuella Lose;  now dr gay   papillay TCC (Ta G1)  s/p TURBT and chemo instillation 2014   History of chest pain 12/2017   heart  cath normal   History of encephalopathy 05/27/2015   admission w/ acute encephalopathy thought to be secondary to pain meds and COPD   History of gastric ulcer    History of Helicobacter pylori infection    History of kidney stones    History of TIA (transient ischemic attack) 2008  and 10-19-2018    no residual's   History of traumatic head injury 2010   w/ LOC  per pt needed stitches, hit in head with a mower blade   Hyperlipidemia    Hypertension    Hypogonadism male    s/p  bilateral orchiectomy   Hypothyroidism    Insomnia    Mild obstructive sleep apnea    study in epic 12-04-2016, no cpap   PTSD (post-traumatic stress disorder)    chronic   PTSD (post-traumatic stress disorder)    RA (rheumatoid arthritis) ([place])    followed by [facility]   Seizures, transient [facility]) neurologist-  dr Krista Blue--  differential dx complex partial seizure .vs.  mood disorder .vs.  pseudoseizure--  negative EEG's   confusion episodes and staring spells since 11/ 2015   (03-26-2020 per pt wife last seizure 10 /2021)   Transient confusion NEUOROLOGIST-  DR Krista Blue   Episodes since 11/ 2015--  neurologist dx  differential complex partial seizure  .vs. mood  disorder . vs. pseudoseizure   Type 2 diabetes mellitus treated with insulin [facility])    endocrinologist--- dr Loanne Drilling---  (03-26-2020 pt does not check blood sugar at home)    Patient Active Problem List   Diagnosis Date Noted   Ischemic stroke ([place]) 06/10/2020   Hyperglycemia    Gangrene of finger of left hand (HCC)    Pain due to onychomycosis of toenails of both feet 01/28/2020   Hyperglycemia due to type 2 diabetes mellitus ([place]) 01/07/2020   Resistance to insulin 01/07/2020   Right leg weakness 04/24/2019   Right leg pain 04/24/2019   Gait abnormality 04/24/2019   TIA (transient ischemic attack) 10/20/2018   Uncontrolled type 2 DM with hyperosmolar nonketotic hyperglycemia ([place]) 10/19/2018   Left sided numbness 10/19/2018   Diabetic  autonomic neuropathy associated with type 2 diabetes mellitus ([place]) 02/01/2018   DM type 2 with diabetic peripheral neuropathy ([place]) 02/01/2018   Dyslipidemia 01/31/2018   Coronary artery disease involving native coronary artery of native heart without angina pectoris 01/31/2018   Chest pain 01/01/2018   Seizure disorder ([place]) 12/19/2016   Essential tremor 12/19/2016   Polypharmacy 12/19/2016   Chronic post-traumatic stress disorder (PTSD) 06/16/2016   Tremor 06/16/2016   Tobacco use disorder 05/05/2016   Psychophysiological insomnia 06/02/2015   Altered mental status 05/28/2015   Acute bronchitis 05/28/2015   Type 2 diabetes mellitus with hyperglycemia ([place]) 05/28/2015   Hypothyroidism 05/28/2015   Bipolar 2 disorder ([place]) 05/28/2015   History of rheumatoid arthritis 05/28/2015   Chronic pain 05/28/2015   Depression 03/18/2015   Carpal tunnel syndrome on left 02/10/2015   Carpal tunnel syndrome on right 02/10/2015   Diabetes ([place]) 01/23/2015   Obesity (BMI 30-39.9) 03/06/2014   Acute encephalopathy 03/05/2014   HLD (hyperlipidemia) 03/05/2014   Anemia, normocytic normochromic 03/05/2014   Altered mental state    Helicobacter pylori (H. pylori) infection 11/20/2012   Protein-calorie malnutrition, severe ([place]) 10/09/2012   Dysphagia 08/19/2012   Gastroparesis 08/19/2012   Bladder tumor 08/08/2012   Biliary dyskinesia 11/02/2010    Past Surgical History:  Procedure Laterality Date   AMPUTATION Left 04/28/2020   Procedure: LEFT LITTLE FINGER AMPUTATION;  Surgeon: Newt Minion, MD;  Location: [place];  Service: Orthopedics;  Laterality: Left;   CARDIAC CATHETERIZATION  12-27-2001  DR Einar Gip  &  05-26-2009  DR Irish Lack   RESULTS FOR BOTH ARE NORMAL CORONARIES AND PERSERVED LVF/ EF 60%   CARPAL TUNNEL RELEASE Bilateral right 09-16-2003;  left ?   CARPAL TUNNEL RELEASE Left 02/25/2015   Procedure: LEFT CARPAL TUNNEL RELEASE;  Surgeon: Leanora Cover, MD;  Location: [place];  Service: Orthopedics;  Laterality: Left;   CYSTOSCOPY N/A 10/10/2012   Procedure: CYSTOSCOPY CLOT EVACUATION FULGERATION OF BLEEDERS ;  Surgeon: Claybon Jabs, MD;  Location: [facility];  Service: Urology;  Laterality: N/A;   CYSTOSCOPY WITH BIOPSY N/A 11/26/2015   Procedure: CYSTOSCOPY WITH BIOPSY AND FULGURATION;  Surgeon: Kathie Rhodes, MD;  Location: [place];  Service: Urology;  Laterality: N/A;   ESOPHAGOGASTRODUODENOSCOPY  2014   LAPAROSCOPIC CHOLECYSTECTOMY  11-17-2010   ORCHIECTOMY Right 02/21/2016   Procedure: SCROTAL ORCHIECTOMY with TESTICULAR PROSTHESIS IMPLANT;  Surgeon: Kathie Rhodes, MD;  Location: [facility];  Service: Urology;  Laterality: Right;   ORCHIECTOMY Left 09/02/2018   Procedure: ORCHIECTOMY;  Surgeon: Kathie Rhodes, MD;  Location[facility];  Service: Urology;  Laterality: Left;   ROTATOR CUFF REPAIR Right  12/2004   TRANSURETHRAL RESECTION OF BLADDER TUMOR N/A 08/09/2012   Procedure: TRANSURETHRAL RESECTION OF BLADDER TUMOR (TURBT) WITH GYRUS WITH MITOMYCIN C;  Surgeon: Claybon Jabs, MD;  Location: [facility];  Service: Urology;  Laterality: N/A;   TRANSURETHRAL RESECTION OF BLADDER TUMOR N/A 03/29/2020   Procedure: TRANSURETHRAL RESECTION OF BLADDER TUMOR (TURBT) and post-op instillation of gemcitabine;  Surgeon: Janith Lima, MD;  [facility];  Service: Urology;  Laterality: N/A;   TRANSURETHRAL RESECTION OF BLADDER TUMOR WITH GYRUS (TURBT-GYRUS) N/A 02/27/2014   Procedure: TRANSURETHRAL RESECTION OF BLADDER TUMOR WITH GYRUS (TURBT-GYRUS);  Surgeon: Claybon Jabs, MD;  Location: [facility];  Service: Urology;  Laterality: N/A;       Family History  Problem Relation Age of Onset   Diabetes Mother    Diabetes Father    Hypertension Father    Heart attack Father 4       died age 80   Alcohol abuse Father    Colon cancer Neg Hx     Esophageal cancer Neg Hx    Stomach cancer Neg Hx    Rectal cancer Neg Hx     Social History   Tobacco Use   Smoking status: Every Day    Packs/day: 1.50    Years: 38.00    Pack years: 57.00    Types: Cigarettes   Smokeless tobacco: Never  Vaping Use   Vaping Use: Never used  Substance Use Topics   Alcohol use: No   Drug use: No    Home Medications Prior to Admission medications   Medication Sig Start Date End Date Taking? Authorizing Provider  lurasidone (LATUDA) 40 MG TABS tablet Take 1 tablet (40 mg total) by mouth daily with breakfast. 08/25/20   Arfeen, Arlyce Harman, MD  clopidogrel (PLAVIX) 75 MG tablet Take 1 tablet (75 mg total) by mouth daily. 07/28/20   Marcial Pacas, MD  Continuous Blood Gluc Sensor (FREESTYLE LIBRE 2 SENSOR) MISC 1 Device by Does not apply route every 14 (fourteen) days. Patient taking differently: 1 Device by Does not apply route every 14 (fourteen) days. Has not received- waiting on Insurance 02/25/20   Renato Shin, MD  Dulaglutide (TRULICITY) 4.5 0000000 SOPN Inject 4.5 mg into the skin once a week. Patient taking differently: Inject 4.5 mg into the skin every Thursday. 01/22/20   Renato Shin, MD  DULoxetine (CYMBALTA) 60 MG capsule Take 1 capsule (60 mg total) by mouth daily. 07/28/20   Marcial Pacas, MD  fenofibrate 160 MG tablet Take 160 mg by mouth daily. 12/31/19   [provider]  insulin glargine, 2 Unit Dial, (TOUJEO MAX SOLOSTAR) 300 UNIT/ML Solostar Pen Inject 180 Units into the skin every morning. And pen needles 1/day Patient taking differently: Inject 180 Units into the skin in the morning. 02/25/20   Renato Shin, MD  isosorbide mononitrate (IMDUR) 120 MG 24 hr tablet Take 1 tablet (120 mg total) by mouth daily. Please make annual appt in April with Dr. Percival Spanish for refills. Thank you Patient taking differently: Take 120 mg by mouth daily. 05/05/19   Minus Breeding, MD  lamoTRIgine (LAMICTAL) 150 MG tablet Take 1 tablet (150 mg  total) by mouth 2 (two) times daily. 07/28/20   Marcial Pacas, MD  levothyroxine (SYNTHROID, LEVOTHROID) 25 MCG tablet Take 25 mcg by mouth daily before breakfast.     [provider]  metFORMIN (GLUCOPHAGE-XR) 500 MG 24 hr tablet Take 500 mg by mouth 2 (  two) times daily. 08/24/18   [provider]  metoCLOPramide (REGLAN) 10 MG tablet Take by mouth 3 (three) times daily with meals.    [provider]  morphine (MS CONTIN) 60 MG 12 hr tablet Take 60 mg by mouth 2 (two) times daily. Every 12 hours 06/12/19   [provider]  morphine (MSIR) 15 MG tablet Take 15 mg by mouth 2 (two) times daily as needed. For breakthrough pain  (03-26-2020 per pt wife pt takes this twice also 06/11/19   [provider]  omeprazole (PRILOSEC) 40 MG capsule Take 40 mg by mouth daily.    [provider]  Pancrelipase, Lip-Prot-Amyl, (ZENPEP) 5000-24000 units CPEP Take 1-2 capsules by mouth See admin instructions. Takes 1 capsule in the morning and 2 capsules at night    [provider]  pregabalin (LYRICA) 225 MG capsule Take one cap midday, one cap in evening. 07/28/20   Marcial Pacas, MD  pregabalin (LYRICA) 225 MG capsule Take 1 capsule (225 mg total) by mouth every morning. 07/28/20   Marcial Pacas, MD  rosuvastatin (CRESTOR) 10 MG tablet Take 10 mg by mouth daily.    [provider]  sucralfate (CARAFATE) 1 g tablet Take 1 g by mouth 2 (two) times daily. 01/26/20   [provider]  tamsulosin (FLOMAX) 0.4 MG CAPS capsule Take 0.8 mg by mouth at bedtime.    [provider]  traZODone (DESYREL) 150 MG tablet Take 1 tablet (150 mg total) by mouth at bedtime. 11/08/20   Kathlee Nations, MD  venlafaxine XR (EFFEXOR-XR) 150 MG 24 hr capsule Take 1 capsule (150 mg total) by mouth daily with breakfast. 07/28/20   Marcial Pacas, MD  Vitamin D, Ergocalciferol, (DRISDOL) 1.25 MG (50000 UNIT) CAPS capsule Take 50,000 Units by mouth every Wednesday. 12/26/19    [provider]    Allergies    Celebrex [celecoxib], Hydrocodone, and Sulfa antibiotics  Review of Systems   Review of Systems  Constitutional:  Negative for chills and fever.  HENT:  Negative for ear pain and sore throat.   Eyes:  Negative for pain and visual disturbance.  Respiratory:  Negative for cough, chest tightness and shortness of breath.   Cardiovascular:  Negative for chest pain and palpitations.  Gastrointestinal:  Negative for abdominal pain and vomiting.  Genitourinary:  Negative for dysuria and hematuria.  Musculoskeletal:  Negative for arthralgias and back pain.  Skin:  Negative for color change and rash.  Neurological:  Positive for weakness and numbness. Negative for seizures and syncope.  All other systems reviewed and are negative.  Physical Exam Updated Vital Signs BP 119/71   Pulse 71   Temp 98.6 F (37 C) (Oral)   Resp 14   Ht 6' (1.829 m)   Wt 101.6 kg   SpO2 98%   BMI 30.38 kg/m   Physical Exam Vitals and nursing note reviewed.  Constitutional:      Appearance: He is well-developed.  HENT:     Head: Normocephalic and atraumatic.  Eyes:     General: Lids are normal. Vision grossly intact.     Conjunctiva/sclera: Conjunctivae normal.     Pupils: Pupils are equal, round, and reactive to light.     Visual Fields: Right eye visual fields normal and left eye visual fields normal.  Cardiovascular:     Rate and Rhythm: Normal rate and regular rhythm.     Heart sounds: No murmur heard. Pulmonary:     Effort: Pulmonary  effort is normal. No respiratory distress.     Breath sounds: Normal breath sounds.  Abdominal:     Palpations: Abdomen is soft.     Tenderness: There is no abdominal tenderness.  Musculoskeletal:     Cervical back: Neck supple.  Skin:    General: Skin is warm and dry.  Neurological:     Mental Status: He is alert.     GCS: GCS eye subscore is 4. GCS verbal subscore is 5. GCS motor subscore is 6.     Cranial Nerves:  Cranial nerves are intact.     Sensory: Sensory deficit present.     Motor: Weakness present.     Coordination: Coordination is intact.     Comments: Left upper and lower extremity weakness, drift but doesn't hit bed.  Left upper and lower extremity subjective sensation changes. Able to detect sensation, sharp versus dull touch.    ED Results / Procedures / Treatments   Labs (all labs ordered are listed, but only abnormal results are displayed) Labs Reviewed  CBC - Abnormal; Notable for the following components:      Result Value   Hemoglobin 12.3 (*)    HCT 36.4 (*)    All other components within normal limits  DIFFERENTIAL - Abnormal; Notable for the following components:   Abs Immature Granulocytes 0.10 (*)    All other components within normal limits  COMPREHENSIVE METABOLIC PANEL - Abnormal; Notable for the following components:   Sodium 130 (*)    Potassium 5.2 (*)    Chloride 96 (*)    Glucose, Bld 542 (*)    BUN 28 (*)    Creatinine, Ser 1.86 (*)    AST 13 (*)    GFR, Estimated 43 (*)    All other components within normal limits  CBG MONITORING, ED - Abnormal; Notable for the following components:   Glucose-Capillary 595 (*)    All other components within normal limits  CBG MONITORING, ED - Abnormal; Notable for the following components:   Glucose-Capillary 473 (*)    All other components within normal limits  PROTIME-INR  APTT    EKG None  Radiology CT HEAD WO CONTRAST  Result Date: 11/14/2020 CLINICAL DATA:  Acute neuro deficit, possible stroke EXAM: CT HEAD WITHOUT CONTRAST TECHNIQUE: Contiguous axial images were obtained from the base of the skull through the vertex without intravenous contrast. COMPARISON:  06/11/2020 FINDINGS: Brain: No evidence of acute infarction, hemorrhage, hydrocephalus, extra-axial collection or mass lesion/mass effect. Vascular: Atherosclerotic and physiologic intracranial calcifications. Skull: Negative Sinuses/Orbits: Negative  Other: None IMPRESSION: No acute findings Electronically Signed   By: Lucrezia Europe M.D.   On: 11/14/2020 15:05    Procedures Procedures   Medications Ordered in ED Medications  sodium chloride flush (NS) 0.9 % injection 3 mL (3 mLs Intravenous Given 11/14/20 1456)  insulin aspart (novoLOG) injection 10 Units (10 Units Intravenous Given 11/14/20 1528)    ED Course  I have reviewed the triage vital signs and the nursing notes.  Pertinent labs & imaging results that were available during my care of the patient were reviewed by me and considered in my medical decision making (see chart for details).    MDM Rules/Calculators/A&P                          4:25 PM 55 yo male with pmh of ischemic stroke in 06/12/2020 receiving TPA presenting for stroke like symptoms.  Discovery of symptoms:  0300 today Last known normal: 2300 last night NIH stroke scale: 3 -Left upper and lower extremity weakness, drift but doesn't hit bed.  -Left upper and lower extremity subjective sensation changes.  -Able to detect sensation, sharp versus dull touch.  -CT head demonstrates no acute process.  -MRI ordered and results pending -Patient's care discussed with neurology team. Recommendations for admission to hospitalist team and MRI.   Of note, patient hyperglycemic. Known diabetic. No signs/symptoms of DKA. Did not take insulin this morning due to coming to hospital. Insulin ordered.  4:27 PM MRI and admission pending and signed out to oncoming physician.    Final Clinical Impression(s) / ED Diagnoses Final diagnoses:  Stroke-like symptom  Type 2 diabetes mellitus with hyperglycemia, with long-term current use of insulin (HCC)  Weakness of left arm  Weakness of left leg    Rx / DC Orders ED Discharge Orders     None        Lianne Cure, DO [id number]    Lianne Cure, DO [id number]

## 2020-11-14 NOTE — Plan of Care (Signed)
  Problem: Education: Goal: Knowledge of General Education information will improve Description Including pain rating scale, medication(s)/side effects and non-pharmacologic comfort measures Outcome: Progressing   

## 2020-11-14 NOTE — H&P (Addendum)
History and Physical    Vincent [name] [id number] DOB: [birth year] DOA: 11/14/2020  Referring MD/NP/PA: Madalyn Rob, MD PCP: Hayden Rasmussen, MD  Patient coming from: Urgent care via EMS  Chief Complaint: Left arm numbness  I have personally briefly reviewed patient's old medical records in [place]   HPI: Vincent [name] is a 55 y.o. left-handed male with medical history significant of hypertension, COPD, poorly controlled diabetes mellitus type 2, hypothyroidism, bladder cancer, rheumatoid arthritis, benign essential tremor, anxiety, bipolar 2 disorder, and possible seizure disorder presents with complaints of left-sided numbness.  Patient reports that he had gone to sleep that night around 11 PM in his normal state of health.  He states that he was awoken out of his sleep with complaints of chest pain and shortness of breath when he first noticed that he had numbness on his left side.  Patient reportedly went back to sleep and when he woke up at 9 AM this morning numbness spreading up the left arm.  Due to the symptoms he reports that he has been dropping things out of his hand and cannot use it.  He went to urgent care and was sent by EMS for concern of stroke.  Patient reports he had similar symptoms except for on his right side at that time.  At this time the patient does continue to smoke 2 packs of cigarettes per day average.  Records note patient was last hospitalized in March of this year with concern for acute ischemic stroke after presenting with complaints of acute onset of dysarthria with right-sided weakness and numbness.  CT scan of the brain did not show any acute abnormalities, but clinically findings were consistent with a stroke and patient was given tPA.  Post tPA subsequent MRI did not show any structural evidence of acute stroke.  During that hospitalization he had been found to have an acute kidney injury with hyponatremia and hyperglycemia which improved with  IV.  It was suspected that patient had not been compliant with home diabetic regimen which appear to include Toujeo 180 units at that time.   ED Course: On admission into the emergency department patient has stable vital signs.  CT scan of the head showed no acute abnormalities.  Patient was out of the window for tPA.  Labs significant for hemoglobin 12.3, sodium 130, potassium 5.2, BUN 28, creatinine 1.86, glucose 524, anion gap 8, and INR 0.9.  Neurology recommended admission for work-up of TIA.  Patient had been given 10 units of insulin x1 dose.  MRI had been obtained and negative for any acute abnormality  Review of Systems  Constitutional:  Negative for fever and malaise/fatigue.  HENT:  Positive for ear pain.   Eyes:  Negative for photophobia and pain.  Respiratory:  Negative for cough and shortness of breath.   Cardiovascular:  Positive for chest pain. Negative for leg swelling.  Gastrointestinal:  Positive for nausea and vomiting. Negative for abdominal pain.  Genitourinary:  Negative for dysuria and hematuria.  Musculoskeletal:  Negative for joint pain.  Skin:        Positive for nonhealing wound of the right hand  Neurological:  Positive for sensory change and weakness. Negative for loss of consciousness.  Psychiatric/Behavioral:  Positive for substance abuse.    Past Medical History:  Diagnosis Date   Anxiety    Benign essential tremor    Bipolar 2 disorder ([place])    followed by [facility]--- dr s. Adele Schilder   Bladder  cancer [facility])    recurrent   CAD (coronary artery disease)    cardiac cath 2003  and 2011 both showed normal coronary arteries w/ preserved lvf;  Non obstructive on CTA Oct 2019.    Chronic pain syndrome    back---- [facility] [place]   Cold extremities    BLE   COPD (chronic obstructive pulmonary disease) ([place])    DDD (degenerative disc disease), lumbar    Diabetic peripheral neuropathy ([place])    Gastroparesis    followed by dr Henrene Pastor   GERD  (gastroesophageal reflux disease)    Hiatal hernia    History of bladder cancer urologist-  previously dr Consuella Lose;  now dr gay   papillay TCC (Ta G1)  s/p TURBT and chemo instillation 2014   History of chest pain 12/2017   heart cath normal   History of encephalopathy 05/27/2015   admission w/ acute encephalopathy thought to be secondary to pain meds and COPD   History of gastric ulcer    History of Helicobacter pylori infection    History of kidney stones    History of TIA (transient ischemic attack) 2008  and 10-19-2018    no residual's   History of traumatic head injury 2010   w/ LOC  per pt needed stitches, hit in head with a mower blade   Hyperlipidemia    Hypertension    Hypogonadism male    s/p  bilateral orchiectomy   Hypothyroidism    Insomnia    Mild obstructive sleep apnea    study in epic 12-04-2016, no cpap   PTSD (post-traumatic stress disorder)    chronic   PTSD (post-traumatic stress disorder)    RA (rheumatoid arthritis) ([place])    followed by [facility]   Seizures, transient [facility]) neurologist-  dr Krista Blue--  differential dx complex partial seizure .vs.  mood disorder .vs.  pseudoseizure--  negative EEG's   confusion episodes and staring spells since 11/ 2015   (03-26-2020 per pt wife last seizure 10 /2021)   Transient confusion NEUOROLOGIST-  DR Krista Blue   Episodes since 11/ 2015--  neurologist dx  differential complex partial seizure  .vs. mood disorder . vs. pseudoseizure   Type 2 diabetes mellitus treated with insulin [facility])    endocrinologist--- dr Loanne Drilling---  (03-26-2020 pt does not check blood sugar at home)    Past Surgical History:  Procedure Laterality Date   AMPUTATION Left 04/28/2020   Procedure: LEFT LITTLE FINGER AMPUTATION;  Surgeon: Newt Minion, MD;  Location: [place];  Service: Orthopedics;  Laterality: Left;   CARDIAC CATHETERIZATION  12-27-2001  DR Einar Gip  &  05-26-2009  DR Irish Lack   RESULTS FOR BOTH ARE NORMAL CORONARIES AND PERSERVED  LVF/ EF 60%   CARPAL TUNNEL RELEASE Bilateral right 09-16-2003;  left ?   CARPAL TUNNEL RELEASE Left 02/25/2015   Procedure: LEFT CARPAL TUNNEL RELEASE;  Surgeon: Leanora Cover, MD;  Location: [place];  Service: Orthopedics;  Laterality: Left;   CYSTOSCOPY N/A 10/10/2012   Procedure: CYSTOSCOPY CLOT EVACUATION FULGERATION OF BLEEDERS ;  Surgeon: Claybon Jabs, MD;  Location: [facility] [place];  Service: Urology;  Laterality: N/A;   CYSTOSCOPY WITH BIOPSY N/A 11/26/2015   Procedure: CYSTOSCOPY WITH BIOPSY AND FULGURATION;  Surgeon: Kathie Rhodes, MD;  Location: [place];  Service: Urology;  Laterality: N/A;   ESOPHAGOGASTRODUODENOSCOPY  2014   LAPAROSCOPIC CHOLECYSTECTOMY  11-17-2010   ORCHIECTOMY Right 02/21/2016   Procedure: SCROTAL  ORCHIECTOMY with TESTICULAR PROSTHESIS IMPLANT;  Surgeon: Kathie Rhodes, MD;  Location: [facility];  Service: Urology;  Laterality: Right;   ORCHIECTOMY Left 09/02/2018   Procedure: ORCHIECTOMY;  Surgeon: Kathie Rhodes, MD;  Location: [facility];  Service: Urology;  Laterality: Left;   ROTATOR CUFF REPAIR Right 12/2004   TRANSURETHRAL RESECTION OF BLADDER TUMOR N/A 08/09/2012   Procedure: TRANSURETHRAL RESECTION OF BLADDER TUMOR (TURBT) WITH GYRUS WITH MITOMYCIN C;  Surgeon: Claybon Jabs, MD;  Location: [facility];  Service: Urology;  Laterality: N/A;   TRANSURETHRAL RESECTION OF BLADDER TUMOR N/A 03/29/2020   Procedure: TRANSURETHRAL RESECTION OF BLADDER TUMOR (TURBT) and post-op instillation of gemcitabine;  Surgeon: Janith Lima, MD;  Location: [facility];  Service: Urology;  Laterality: N/A;   TRANSURETHRAL RESECTION OF BLADDER TUMOR WITH GYRUS (TURBT-GYRUS) N/A 02/27/2014   Procedure: TRANSURETHRAL RESECTION OF BLADDER TUMOR WITH GYRUS (TURBT-GYRUS);  Surgeon: Claybon Jabs, MD;  Location: [facility];  Service: Urology;  Laterality: N/A;      reports that he has been smoking cigarettes. He has a 57.00 pack-year smoking history. He has never used smokeless tobacco. He reports that he does not drink alcohol and does not use drugs.  Allergies  Allergen Reactions   Celebrex [Celecoxib] Anaphylaxis and Rash   Hydrocodone Rash and Other (See Comments)    "blisters developed on arms"   Sulfa Antibiotics Rash    Family History  Problem Relation Age of Onset   Diabetes Mother    Diabetes Father    Hypertension Father    Heart attack Father 4       died age 70   Alcohol abuse Father    Colon cancer Neg Hx    Esophageal cancer Neg Hx    Stomach cancer Neg Hx    Rectal cancer Neg Hx     Prior to Admission medications   Medication Sig Start Date End Date Taking? Authorizing Provider  lurasidone (LATUDA) 40 MG TABS tablet Take 1 tablet (40 mg total) by mouth daily with breakfast. 08/25/20   Arfeen, Arlyce Harman, MD  clopidogrel (PLAVIX) 75 MG tablet Take 1 tablet (75 mg total) by mouth daily. 07/28/20   Marcial Pacas, MD  Continuous Blood Gluc Sensor (FREESTYLE LIBRE 2 SENSOR) MISC 1 Device by Does not apply route every 14 (fourteen) days. Patient taking differently: 1 Device by Does not apply route every 14 (fourteen) days. Has not received- waiting on Insurance 02/25/20   Renato Shin, MD  Dulaglutide (TRULICITY) 4.5 0000000 SOPN Inject 4.5 mg into the skin once a week. Patient taking differently: Inject 4.5 mg into the skin every Thursday. 01/22/20   Renato Shin, MD  DULoxetine (CYMBALTA) 60 MG capsule Take 1 capsule (60 mg total) by mouth daily. 07/28/20   Marcial Pacas, MD  fenofibrate 160 MG tablet Take 160 mg by mouth daily. 12/31/19   [provider]  insulin glargine, 2 Unit Dial, (TOUJEO MAX SOLOSTAR) 300 UNIT/ML Solostar Pen Inject 180 Units into the skin every morning. And pen needles 1/day Patient taking differently: Inject 180 Units into the skin in the morning. 02/25/20   Renato Shin, MD  isosorbide  mononitrate (IMDUR) 120 MG 24 hr tablet Take 1 tablet (120 mg total) by mouth daily. Please make annual appt in April with Dr. Percival Spanish for refills. Thank you Patient taking differently: Take 120 mg by mouth daily. 05/05/19   Minus Breeding, MD  lamoTRIgine (LAMICTAL) 150 MG tablet  Take 1 tablet (150 mg total) by mouth 2 (two) times daily. 07/28/20   Marcial Pacas, MD  levothyroxine (SYNTHROID, LEVOTHROID) 25 MCG tablet Take 25 mcg by mouth daily before breakfast.     [provider]  metFORMIN (GLUCOPHAGE-XR) 500 MG 24 hr tablet Take 500 mg by mouth 2 (two) times daily. 08/24/18   [provider]  metoCLOPramide (REGLAN) 10 MG tablet Take by mouth 3 (three) times daily with meals.    [provider]  morphine (MS CONTIN) 60 MG 12 hr tablet Take 60 mg by mouth 2 (two) times daily. Every 12 hours 06/12/19   [provider]  morphine (MSIR) 15 MG tablet Take 15 mg by mouth 2 (two) times daily as needed. For breakthrough pain  (03-26-2020 per pt wife pt takes this twice also 06/11/19   [provider]  omeprazole (PRILOSEC) 40 MG capsule Take 40 mg by mouth daily.    [provider]  Pancrelipase, Lip-Prot-Amyl, (ZENPEP) 5000-24000 units CPEP Take 1-2 capsules by mouth See admin instructions. Takes 1 capsule in the morning and 2 capsules at night    [provider]  pregabalin (LYRICA) 225 MG capsule Take one cap midday, one cap in evening. 07/28/20   Marcial Pacas, MD  pregabalin (LYRICA) 225 MG capsule Take 1 capsule (225 mg total) by mouth every morning. 07/28/20   Marcial Pacas, MD  rosuvastatin (CRESTOR) 10 MG tablet Take 10 mg by mouth daily.    [provider]  sucralfate (CARAFATE) 1 g tablet Take 1 g by mouth 2 (two) times daily. 01/26/20   [provider]  tamsulosin (FLOMAX) 0.4 MG CAPS capsule Take 0.8 mg by mouth at bedtime.    [provider]  traZODone (DESYREL) 150 MG tablet Take 1 tablet (150 mg total) by mouth at  bedtime. 11/08/20   Kathlee Nations, MD  venlafaxine XR (EFFEXOR-XR) 150 MG 24 hr capsule Take 1 capsule (150 mg total) by mouth daily with breakfast. 07/28/20   Marcial Pacas, MD  Vitamin D, Ergocalciferol, (DRISDOL) 1.25 MG (50000 UNIT) CAPS capsule Take 50,000 Units by mouth every Wednesday. 12/26/19   [provider]    Physical Exam:  Constitutional: Middle-age male currently in no acute distress Vitals:   11/14/20 1430 11/14/20 1445 11/14/20 1530 11/14/20 1637  BP: 115/63 96/81 119/71 (!) 111/59  Pulse: 75 74 71 73  Resp: '14 14 14 16  '$ Temp:      TempSrc:      SpO2: 96% 96% 98% 95%  Weight:      Height:       Eyes: PERRL, lids and conjunctivae normal ENMT: Mucous membranes are moist. Posterior pharynx clear of any exudate or lesions.  Neck: normal, supple, no masses, no thyromegaly Respiratory: clear to auscultation bilaterally, no wheezing, no crackles. Normal respiratory effort. No accessory muscle use.  Cardiovascular: Regular rate and rhythm, no murmurs / rubs / gallops. No extremity edema. 2+ pedal pulses. No carotid bruits.  Abdomen: no tenderness, no masses palpated. No hepatosplenomegaly. Bowel sounds positive.  Musculoskeletal: no clubbing / cyanosis.  Amputation of fifth digit of the left hand Skin: Chronic wound in between the first and second digit of the right hand Neurologic: CN 2-12 grossly intact.  Abnormal sensation reported of the left arm and leg.  Left hand strength 3/5, but left upper extremity strength otherwise 4/5.  Geref appreciated of the left upper extremity.  Right upper lower extremity strength 4/5.  Right upper and lower  extremity strength 5/5 Psychiatric: Normal judgment and insight. Alert and oriented x 3. Normal mood.     Labs on Admission: I have personally reviewed following labs and imaging studies  CBC: Recent Labs  Lab 11/14/20 1357  WBC 7.3  NEUTROABS 3.8  HGB 12.3*  HCT 36.4*  MCV 83.9  PLT 99991111   Basic Metabolic  Panel: Recent Labs  Lab 11/14/20 1357  NA 130*  K 5.2*  CL 96*  CO2 26  GLUCOSE 542*  BUN 28*  CREATININE 1.86*  CALCIUM 9.2   GFR: Estimated Creatinine Clearance: 56.6 mL/min (A) (by C-G formula based on SCr of 1.86 mg/dL (H)). Liver Function Tests: Recent Labs  Lab 11/14/20 1357  AST 13*  ALT 16  ALKPHOS 67  BILITOT 0.3  PROT 6.5  ALBUMIN 3.7   No results for input(s): LIPASE, AMYLASE in the last 168 hours. No results for input(s): AMMONIA in the last 168 hours. Coagulation Profile: Recent Labs  Lab 11/14/20 1357  INR 0.9   Cardiac Enzymes: No results for input(s): CKTOTAL, CKMB, CKMBINDEX, TROPONINI in the last 168 hours. BNP (last 3 results) No results for input(s): PROBNP in the last 8760 hours. HbA1C: No results for input(s): HGBA1C in the last 72 hours. CBG: Recent Labs  Lab 11/14/20 1353 11/14/20 1410  GLUCAP 595* 473*   Lipid Profile: No results for input(s): CHOL, HDL, LDLCALC, TRIG, CHOLHDL, LDLDIRECT in the last 72 hours. Thyroid Function Tests: No results for input(s): TSH, T4TOTAL, FREET4, T3FREE, THYROIDAB in the last 72 hours. Anemia Panel: No results for input(s): VITAMINB12, FOLATE, FERRITIN, TIBC, IRON, RETICCTPCT in the last 72 hours. Urine analysis:    Component Value Date/Time   COLORURINE STRAW (A) 01/07/2020 1756   APPEARANCEUR CLEAR 01/07/2020 1756   LABSPEC 1.029 01/07/2020 1756   PHURINE 6.0 01/07/2020 1756   GLUCOSEU >=500 (A) 01/07/2020 1756   HGBUR NEGATIVE 01/07/2020 1756   BILIRUBINUR NEGATIVE 01/07/2020 1756   KETONESUR NEGATIVE 01/07/2020 1756   PROTEINUR NEGATIVE 01/07/2020 1756   UROBILINOGEN 1.0 03/04/2014 2312   NITRITE NEGATIVE 01/07/2020 1756   LEUKOCYTESUR NEGATIVE 01/07/2020 1756   Sepsis Labs: No results found for this or any previous visit (from the past 240 hour(s)).   Radiological Exams on Admission: CT HEAD WO CONTRAST  Result Date: 11/14/2020 CLINICAL DATA:  Acute neuro deficit, possible stroke  EXAM: CT HEAD WITHOUT CONTRAST TECHNIQUE: Contiguous axial images were obtained from the base of the skull through the vertex without intravenous contrast. COMPARISON:  06/11/2020 FINDINGS: Brain: No evidence of acute infarction, hemorrhage, hydrocephalus, extra-axial collection or mass lesion/mass effect. Vascular: Atherosclerotic and physiologic intracranial calcifications. Skull: Negative Sinuses/Orbits: Negative Other: None IMPRESSION: No acute findings Electronically Signed   By: Lucrezia Europe M.D.   On: 11/14/2020 15:05   MR BRAIN WO CONTRAST  Result Date: 11/14/2020 CLINICAL DATA:  Stroke suspected. New onset of left upper and lower extremity weakness. EXAM: MRI HEAD WITHOUT CONTRAST TECHNIQUE: Multiplanar, multiecho pulse sequences of the brain and surrounding structures were obtained without intravenous contrast. COMPARISON:  CT head without contrast 11/14/2020. MR head without contrast 06/11/2020. FINDINGS: Brain: No acute infarct, hemorrhage, or mass lesion is present. No significant white matter lesions are present. The ventricles are of normal size. No significant extraaxial fluid collection is present. A remote lacunar infarct in the inferior right cerebellum is stable. No new lesions are present. Brainstem and cerebellum are otherwise within normal limits. The internal auditory canals are within normal limits. Vascular: Flow is present in  the major intracranial arteries. Skull and upper cervical spine: The craniocervical junction is normal. Upper cervical spine is within normal limits. Marrow signal is unremarkable. Sinuses/Orbits: The paranasal sinuses and mastoid air cells are clear. The globes and orbits are within normal limits. IMPRESSION: 1. No acute intracranial abnormality or significant interval change. 2. Stable remote lacunar infarct of the inferior right cerebellum. Electronically Signed   By: San Morelle M.D.   On: 11/14/2020 16:33    EKG: Independently reviewed.  Sinus rhythm  at 75 bpm with early repolarization Assessment/Plan Stroke-like symptoms: Acute.  Patient presented with complaints of left-sided numbness and weakness.  Today CT scan and subsequent MRI of the brain showed no acute abnormalities.  Previously reported having right-sided weakness March for which patient received tPA based off his clinical findings.  Question cause of patient's symptoms at this time making note that history of disorder. -Admit medical telemetry bed -Neurochecks -Follow-up hemoglobin A1c and lipid panel -Check echocardiogram -Check  EEG -PT/OT to evaluate and treat in a.m. -Consider need to formally consult neurology if symptoms persist  Chest pain: Acute.  Patient reports that he had initially been woken up out of his sleep due to complaints of chest pain and shortness of breath.  EKG likely showing likely early repolarization and he currently denies any complaints of chest pain.O2 saturations maintained on room air and therefore have low suspicion of DVT/PE.. -Check cardiac troponins  Acute kidney injury: Patient presents with creatinine elevated up to 1.86 BUN 28.  Patient baseline creatinine previously and to be within normal limits in 06/2020. -Monitor intake and output -Normal saline IV fluids 100 mL/h -Recheck kidney function in a.m.  Diabetes mellitus type 2, uncontrolled with hyperglycemia: Acute.  Patient presents with glucose elevated at 542 without elevated anion gap.  Patient has been given 10 units of insulin with repeat glucose 169.   Last hemoglobin A1c was 11.4 on 06/2020.  His home regimen includes glargine 180 units daily, metformin 500 mg twice daily, and Trulicity 4.5 mg weekly. Suspect patient is likely not compliant with his patient regimen.  -Hypoglycemic protocols -Check hemoglobin A1c -Held metformin -Continue regular -Semglyee 10 units twice daily -CBGs before every meal with moderate SSI -Adjust regimen as needed  Hyperkalemia: Acute.  Potassium was  elevated up to 5.2.  Patient had been given and started on IV fluids. -Recheck potassium in a.m.  Hyperlipidemia -Follow-up lipid panel -Continue fenofibrate and Crestor  Wound of right hand: Patient reports having open wound since November of last year on the right hand. -Wound care  Chronic pain and opioid use -Continue home pain medication regimen  Hypothyroidism: TSH-1.831 -Continue levothyroxine  History of bladder cancer: Initially diagnosed in 2017 and being followed by Dr. Abner Greenspan of urology. -Continue outpatient follow-up with urology  Tobacco abuse: Patient reports medical history 5 cigarettes/day on average. -Continue to counsel patient on need of cessation of tobacco use  Continued home medications for conditions thought to be chronic.  DVT prophylaxis: Lovenox Code Status: Full Family Communication: Patient declined need to update family at this time. Disposition Plan: Home Consults called: Neurology Admission status: Observation  Norval Morton MD [facility]   If 7PM-7AM, please contact night-coverage   11/14/2020, 4:46 PM

## 2020-11-14 NOTE — ED Triage Notes (Signed)
Pt bib [facility] from UC with c/o left sided numbness in arm and leg. LKW 2300 yesterday. Pt went to sleep completely normal, work up at 0500 with numbness in Lt hand and foot. Went back to sleep and woke up at 0900 and c/o numbness spreading up Lt arm. Went to UC this morning. Pt did not take DM medication and neuropathy medication. Hx of TIA, Stroke, and DM neuropathy.   EMS vitals 130/70 BP 70 HR 16 RR 98% SpO2 566 CBG

## 2020-11-15 ENCOUNTER — Observation Stay (HOSPITAL_BASED_OUTPATIENT_CLINIC_OR_DEPARTMENT_OTHER): Payer: No Typology Code available for payment source

## 2020-11-15 ENCOUNTER — Observation Stay (HOSPITAL_COMMUNITY): Payer: No Typology Code available for payment source

## 2020-11-15 DIAGNOSIS — R2990 Unspecified symptoms and signs involving the nervous system: Secondary | ICD-10-CM | POA: Diagnosis not present

## 2020-11-15 DIAGNOSIS — N179 Acute kidney failure, unspecified: Secondary | ICD-10-CM | POA: Diagnosis not present

## 2020-11-15 DIAGNOSIS — R079 Chest pain, unspecified: Secondary | ICD-10-CM | POA: Diagnosis not present

## 2020-11-15 DIAGNOSIS — E1142 Type 2 diabetes mellitus with diabetic polyneuropathy: Secondary | ICD-10-CM | POA: Diagnosis not present

## 2020-11-15 DIAGNOSIS — G894 Chronic pain syndrome: Secondary | ICD-10-CM | POA: Diagnosis not present

## 2020-11-15 DIAGNOSIS — E785 Hyperlipidemia, unspecified: Secondary | ICD-10-CM | POA: Diagnosis not present

## 2020-11-15 DIAGNOSIS — R531 Weakness: Secondary | ICD-10-CM | POA: Diagnosis not present

## 2020-11-15 LAB — CBC WITH DIFFERENTIAL/PLATELET
Abs Immature Granulocytes: 0.05 10*3/uL (ref 0.00–0.07)
Basophils Absolute: 0.1 10*3/uL (ref 0.0–0.1)
Basophils Relative: 1 %
Eosinophils Absolute: 0.2 10*3/uL (ref 0.0–0.5)
Eosinophils Relative: 3 %
HCT: 33.7 % — ABNORMAL LOW (ref 39.0–52.0)
Hemoglobin: 11.4 g/dL — ABNORMAL LOW (ref 13.0–17.0)
Immature Granulocytes: 1 %
Lymphocytes Relative: 49 %
Lymphs Abs: 2.6 10*3/uL (ref 0.7–4.0)
MCH: 28.1 pg (ref 26.0–34.0)
MCHC: 33.8 g/dL (ref 30.0–36.0)
MCV: 83 fL (ref 80.0–100.0)
Monocytes Absolute: 0.4 10*3/uL (ref 0.1–1.0)
Monocytes Relative: 7 %
Neutro Abs: 2.1 10*3/uL (ref 1.7–7.7)
Neutrophils Relative %: 39 %
Platelets: 181 10*3/uL (ref 150–400)
RBC: 4.06 MIL/uL — ABNORMAL LOW (ref 4.22–5.81)
RDW: 12.1 % (ref 11.5–15.5)
WBC: 5.4 10*3/uL (ref 4.0–10.5)
nRBC: 0 % (ref 0.0–0.2)

## 2020-11-15 LAB — ECHOCARDIOGRAM COMPLETE
AR max vel: 3.18 cm2
AV Area VTI: 3.13 cm2
AV Area mean vel: 2.99 cm2
AV Mean grad: 6 mmHg
AV Peak grad: 10.4 mmHg
Ao pk vel: 1.61 m/s
Area-P 1/2: 2.43 cm2
Height: 72 in
MV VTI: 4.03 cm2
S' Lateral: 2.6 cm
Weight: 3584 oz

## 2020-11-15 LAB — GLUCOSE, CAPILLARY
Glucose-Capillary: 308 mg/dL — ABNORMAL HIGH (ref 70–99)
Glucose-Capillary: 347 mg/dL — ABNORMAL HIGH (ref 70–99)
Glucose-Capillary: 360 mg/dL — ABNORMAL HIGH (ref 70–99)
Glucose-Capillary: 384 mg/dL — ABNORMAL HIGH (ref 70–99)
Glucose-Capillary: 256 mg/dL — ABNORMAL HIGH (ref 70–99)
Glucose-Capillary: 201 mg/dL — ABNORMAL HIGH (ref 70–99)

## 2020-11-15 LAB — BASIC METABOLIC PANEL
Anion gap: 6 (ref 5–15)
BUN: 24 mg/dL — ABNORMAL HIGH (ref 6–20)
CO2: 25 mmol/L (ref 22–32)
Calcium: 8.8 mg/dL — ABNORMAL LOW (ref 8.9–10.3)
Chloride: 103 mmol/L (ref 98–111)
Creatinine, Ser: 1.73 mg/dL — ABNORMAL HIGH (ref 0.61–1.24)
GFR, Estimated: 47 mL/min — ABNORMAL LOW (ref >60)
Glucose, Bld: 320 mg/dL — ABNORMAL HIGH (ref 70–99)
Potassium: 4 mmol/L (ref 3.5–5.1)
Sodium: 134 mmol/L — ABNORMAL LOW (ref 135–145)

## 2020-11-15 MED ORDER — INSULIN GLARGINE-YFGN 100 UNIT/ML ~~LOC~~ SOLN
80.0000 [IU] | Freq: Two times a day (BID) | SUBCUTANEOUS | Status: DC
  Administered 2020-11-15: 80 [IU] via SUBCUTANEOUS
  Filled 2020-11-15 (×4): qty 0.8

## 2020-11-15 MED ORDER — MUPIROCIN CALCIUM 2 % EX CREA
TOPICAL_CREAM | Freq: Two times a day (BID) | CUTANEOUS | Status: DC
  Filled 2020-11-15: qty 15

## 2020-11-15 MED ORDER — INSULIN GLARGINE-YFGN 100 UNIT/ML ~~LOC~~ SOLN
60.0000 [IU] | Freq: Two times a day (BID) | SUBCUTANEOUS | Status: DC
  Administered 2020-11-15: 60 [IU] via SUBCUTANEOUS
  Filled 2020-11-15 (×2): qty 0.6

## 2020-11-15 MED ORDER — INSULIN ASPART 100 UNIT/ML IJ SOLN
0.0000 [IU] | Freq: Every day | INTRAMUSCULAR | Status: DC
  Administered 2020-11-15: 2 [IU] via SUBCUTANEOUS

## 2020-11-15 MED ORDER — INSULIN ASPART 100 UNIT/ML IJ SOLN
0.0000 [IU] | Freq: Three times a day (TID) | INTRAMUSCULAR | Status: DC
  Administered 2020-11-16: 4 [IU] via SUBCUTANEOUS

## 2020-11-15 NOTE — Consult Note (Signed)
WOC Nurse Consult Note: Reason for Consult:Chronic, nonhealing wound of right hand located in webspace between thumb and index finger. Wound type:full thickness Pressure Injury POA: N/A Measurement: To be obtained by Bedside RN and documented on the Nursing Flow Sheet. Wound bed: red Drainage (amount, consistency, odor) N/A Periwound:dry Dressing procedure/placement/frequency:  A conservative POC using mupirocin cream to the wound twice daily for 1 week will be implemented. Medication is to be applied after patient washes his hands with soap and water and dries thoroughly.Mupirocin is to be topped with a dry dressing and secured with the least bulky dressing available that will still secure primary wound dressing e.g., Kerlix roll gauze or conform bandaging/paper tape.  [place] nursing team will not follow, but will remain available to this patient, the nursing and medical teams.  Please re-consult if needed. Thanks, Maudie Flakes, MSN, RN, [place], Arther Abbott  Pager# [phone]

## 2020-11-15 NOTE — Progress Notes (Signed)
Physical Therapy Note  PT eval complete with full note to follow;  Recommend Home for transition out of hospital;  Recommend Outpt PT follow up; Discussed with TOC;  Roney [place], [place]  Acute Rehabilitation Services Pager [phone] Office [phone]

## 2020-11-15 NOTE — Progress Notes (Signed)
Pt glucose 405 pt alert oeiented X4 md informed order 10 units novolog was given to ,pt.repeat blood sugar 347. Will continue to monitor blood sugar.

## 2020-11-15 NOTE — Procedures (Signed)
Patient Name: Vincent [name]  MRN: [id number]  Epilepsy Attending: Lora Havens  Referring Physician/Provider: Dr Fuller Plan Date: 11/15/2020  Duration: 23.09 mins  Patient history: 55yo M presented with complaints of left-sided numbness and weakness. CT scan and subsequent MRI of the brain showed no acute abnormalities. EEG to evaluate for seizure.  Level of alertness: Awake  AEDs during EEG study: LTG, PGB  Technical aspects: This EEG study was done with scalp electrodes positioned according to the 10-20 International system of electrode placement. Electrical activity was acquired at a sampling rate of '500Hz'$  and reviewed with a high frequency filter of '70Hz'$  and a low frequency filter of '1Hz'$ . EEG data were recorded continuously and digitally stored.   Description: The posterior dominant rhythm consists of 8.5 Hz activity of moderate voltage (25-35 uV) seen predominantly in posterior head regions, symmetric and reactive to eye opening and eye closing. Hyperventilation and photic stimulation were not performed.     IMPRESSION: This study is within normal limits. No seizures or epileptiform discharges were seen throughout the recording.  [name] [name]

## 2020-11-15 NOTE — Evaluation (Signed)
Occupational Therapy Evaluation Patient Details Name: Vincent [name] MRN: [id number] DOB: 1965-11-26 Today's Date: 11/15/2020    History of Present Illness 55 y.o. male presenting to ED 9/4 with chest pain, SOB, L-sided numbness RUE>RLE  and decreased coordination. CT and MRI (-) for acute findings noting stable lacunar infarct of R inferior cerebellum. Patient admitted under observation for AKI and suspicion of TIA. Of note patient with blood glucose of 524 in ED. PMHx significant for similar episode 05/2020 given tPA (subsequent imaging (-) for acute findings), anxiety, tobacco use (2pk daily), benign essential tremor, bipolar 2 disorder, bladder cancer, COPD, CAD, Hx of seizures, poorly controlled DMII with neuropathy, RA, chronic nonhealing full tickness wound on R hand between thumb and index finger.   Clinical Impression   PTA patient was living with his spouse in a 2-level private residence and was grossly Mod I with ADLs without AD. Wife assists with med management and stair negotiation (goes to 2nd floor 1x weekly to shower, otherwise reports sponge bathing in 1/2 bath on main level) but patient reports driving and cooking with I. Patient does not work. Currently limited by deficits listed below including LUE weakness (L hand dominant), decreased pain perception demonstrating no response to pain along LUE including at nail bed and decreased strength/coordination distally. Patient would benefit from continued acute OT services in prep for safe d/c home with recommendation for outpatient follow-up.     Follow Up Recommendations  Outpatient OT    Equipment Recommendations  None recommended by OT    Recommendations for Other Services       Precautions / Restrictions Precautions Precautions: Fall      Mobility Bed Mobility Overal bed mobility: Independent                  Transfers Overall transfer level: Modified independent Equipment used: None                   Balance Overall balance assessment: No apparent balance deficits (not formally assessed)                                         ADL either performed or assessed with clinical judgement   ADL Overall ADL's : Needs assistance/impaired     Grooming: Modified independent;Standing Grooming Details (indicate cue type and reason): Able to wash hands standing at sink level. Noted decreased Vesper.         Upper Body Dressing : Sitting;Independent Upper Body Dressing Details (indicate cue type and reason): Donned posterior hosptial gown seated EOB. Lower Body Dressing: Sit to/from stand;Modified independent Lower Body Dressing Details (indicate cue type and reason): Able to doff/don footwear seated EOB with compensatory techniques for poor grasp (able to use thumb and index finger). Toilet Transfer: Modified Programmer, applications Details (indicate cue type and reason): To standard height commode in bathroom.                 Vision Patient Visual Report: No change from baseline Vision Assessment?: No apparent visual deficits     Perception     Praxis      Pertinent Vitals/Pain Pain Assessment: No/denies pain     Hand Dominance Left   Extremity/Trunk Assessment Upper Extremity Assessment Upper Extremity Assessment: LUE deficits/detail LUE Deficits / Details: AROM WFL at shoulder, elbow and wrist. Able to form a weak fist. MMT grossly 4-/5 at  shoulder, 4-/5 biceps, 3-/5 triceps, and 2+/5 gross grasp. LUE Sensation: history of peripheral neuropathy;decreased light touch (No response to pain at elbow or nail bed which patient reports in new since yesterday.) LUE Coordination: decreased gross motor   Lower Extremity Assessment Lower Extremity Assessment: Defer to PT evaluation   Cervical / Trunk Assessment Cervical / Trunk Assessment: Normal   Communication Communication Communication: No difficulties   Cognition Arousal/Alertness:  Awake/alert Behavior During Therapy: WFL for tasks assessed/performed Overall Cognitive Status: Within Functional Limits for tasks assessed                                 General Comments: Decreased insight.   General Comments  VSS on RA.    Exercises     Shoulder Instructions      Home Living Family/patient expects to be discharged to:: Private residence Living Arrangements: Spouse/significant other Available Help at Discharge: Family;Available 24 hours/day Type of Home: House Home Access: Stairs to enter [employer] of Steps: 1 Entrance Stairs-Rails: Left (Ascending) Home Layout: Two level;1/2 bath on main level;Bed/bath upstairs;Other (Comment) (Has been sleeping on couch on main level. Goes to 2nd level 1x weekly to shower.) Alternate Level Stairs-Number of Steps: 14 steps Alternate Level Stairs-Rails: Left Bathroom Shower/Tub: Tub/shower unit   Bathroom Toilet: Handicapped height     Home Equipment: Cane - single point;Grab bars - tub/shower;Shower seat;Wheelchair - Education administrator (comment);Walker - 4 wheels   Additional Comments: Patient reports lease is up soon with plan to move to 1-level home.      Prior Functioning/Environment Level of Independence: Needs assistance  Gait / Transfers Assistance Needed: Amb w/o AD, requires assist only for steps to 2nd floor bed/bath with spouse ADL's / Homemaking Assistance Needed: Mod-I with ADL's; able to make meals; drives; wife assists with med management            OT Problem List: Decreased strength;Decreased range of motion;Decreased coordination;Impaired sensation;Impaired UE functional use      OT Treatment/Interventions: Self-care/ADL training;Therapeutic exercise;Neuromuscular education;DME and/or AE instruction;Therapeutic activities;Patient/family education;Balance training    OT Goals(Current goals can be found in the care plan section) Acute Rehab OT Goals Patient Stated Goal: To  regain strength in LUE. OT Goal Formulation: With patient Time For Goal Achievement: 11/29/20 Potential to Achieve Goals: Good ADL Goals Additional ADL Goal #1: Patient will complete ADLs with Mod I and good recall of compensatory techniques in prep for safe return home. Additional ADL Goal #2: Patient will compelete LUE NMR HEP with use of written handout and I.  OT Frequency: Min 2X/week   Barriers to D/C:            Co-evaluation              AM-PAC OT "6 Clicks" Daily Activity     Outcome Measure Help from another person eating meals?: None Help from another person taking care of personal grooming?: None Help from another person toileting, which includes using toliet, bedpan, or urinal?: None Help from another person bathing (including washing, rinsing, drying)?: A Little Help from another person to put on and taking off regular upper body clothing?: None Help from another person to put on and taking off regular lower body clothing?: A Little 6 Click Score: 22   End of Session Equipment Utilized During Treatment: Gait belt;Other (comment) (IV pole with mobility) Nurse Communication: Mobility status  Activity Tolerance: Patient tolerated treatment well Patient left: in  bed;with call bell/phone within reach;with bed alarm set;Other (comment) (Seated EOB)  OT Visit Diagnosis: Muscle weakness (generalized) (M62.81)                Time: [id number] OT Time Calculation (min): 17 min Charges:  OT General Charges $OT Visit: 1 Visit OT Evaluation $OT Eval Low Complexity: 1 Low  Taraya Steward H. OTR/L Supplemental OT, Department of rehab services [phone]  Lorijean Husser R H. 11/15/2020, 8:14 AM

## 2020-11-15 NOTE — TOC Initial Note (Signed)
Transition of Care [facility]) - Initial/Assessment Note    Patient Details  Name: Vincent [name] MRN: [id number] Date of Birth: [birth year]  [facility]) CM/SW Contact:    Pollie Friar, RN Phone Number: 11/15/2020, 1:37 PM  Clinical Narrative:                 Pt lives at home with his spouse that can provide needed supervision. Pt denies issues with home medications. Recommendations are for outpatient therapy. Pt and wife prefer for him to attend [facility]. Orders in epic and information on the AVS. Pt has DME at home.  Spouse is able to provide transport home when medically ready.  Expected Discharge Plan: OP Rehab Barriers to Discharge: Continued Medical Work up   Patient Goals and CMS Choice     Choice offered to / list presented to : Patient, Spouse  Expected Discharge Plan and Services Expected Discharge Plan: OP Rehab   Discharge Planning Services: CM Consult   Living arrangements for the past 2 months: Single Family Home                                      Prior Living Arrangements/Services Living arrangements for the past 2 months: Single Family Home Lives with:: Spouse Patient language and need for interpreter reviewed:: Yes Do you feel safe going back to the place where you live?: Yes      Need for Family Participation in Patient Care: Yes (Comment) Care giver support system in place?: Yes (comment) Current home services: DME (walker/ cane/ shower seat/ wheelchair/ elevated commode) Criminal Activity/Legal Involvement Pertinent to Current Situation/Hospitalization: No - Comment as needed  Activities of Daily Living Home Assistive Devices/Equipment: None ADL Screening (condition at time of admission) Patient's cognitive ability adequate to safely complete daily activities?: Yes Is the patient deaf or have difficulty hearing?: No Does the patient have difficulty seeing, even when wearing glasses/contacts?: No Does the patient  have difficulty concentrating, remembering, or making decisions?: No Patient able to express need for assistance with ADLs?: Yes Does the patient have difficulty dressing or bathing?: No Independently performs ADLs?: Yes (appropriate for developmental age) Does the patient have difficulty walking or climbing stairs?: No Weakness of Legs: None Weakness of Arms/Hands: None  Permission Sought/Granted                  Emotional Assessment Appearance:: Appears stated age Attitude/Demeanor/Rapport: Engaged   Orientation: : Oriented to Self, Oriented to Place, Oriented to  Time, Oriented to Situation   Psych Involvement: No (comment)  Admission diagnosis:  Weakness of left leg [R29.898] Weakness of left arm [R29.898] Stroke-like symptoms [R29.90] Stroke-like symptom [R29.90] Type 2 diabetes mellitus with hyperglycemia, with long-term current use of insulin ([place]) [E11.65, Z79.4] Patient Active Problem List   Diagnosis Date Noted   Stroke-like symptoms 11/14/2020   Hyperkalemia 11/14/2020   AKI (acute kidney injury) ([place]) 11/14/2020   Ischemic stroke ([place]) 06/10/2020   Hyperglycemia    Gangrene of finger of left hand ([place])    Pain due to onychomycosis of toenails of both feet 01/28/2020   Hyperglycemia due to type 2 diabetes mellitus ([place]) 01/07/2020   Resistance to insulin 01/07/2020   Right leg weakness 04/24/2019   Right leg pain 04/24/2019   Gait abnormality 04/24/2019   TIA (transient ischemic attack) 10/20/2018   Uncontrolled type 2 DM with hyperosmolar nonketotic hyperglycemia ([place])  10/19/2018   Left sided numbness 10/19/2018   Diabetic autonomic neuropathy associated with type 2 diabetes mellitus ([place]) 02/01/2018   DM type 2 with diabetic peripheral neuropathy ([place]) 02/01/2018   Dyslipidemia 01/31/2018   Coronary artery disease involving native coronary artery of native heart without angina pectoris 01/31/2018   Chest pain 01/01/2018   Seizure disorder ([place])  12/19/2016   Essential tremor 12/19/2016   Polypharmacy 12/19/2016   Chronic post-traumatic stress disorder (PTSD) 06/16/2016   Tremor 06/16/2016   Tobacco use disorder 05/05/2016   Psychophysiological insomnia 06/02/2015   Altered mental status 05/28/2015   Acute bronchitis 05/28/2015   Type 2 diabetes mellitus with hyperglycemia ([place]) 05/28/2015   Hypothyroidism 05/28/2015   Bipolar 2 disorder ([place]) 05/28/2015   History of rheumatoid arthritis 05/28/2015   Chronic pain 05/28/2015   Depression 03/18/2015   Carpal tunnel syndrome on left 02/10/2015   Carpal tunnel syndrome on right 02/10/2015   Diabetes ([place]) 01/23/2015   Obesity (BMI 30-39.9) 03/06/2014   Acute encephalopathy 03/05/2014   HLD (hyperlipidemia) 03/05/2014   Anemia, normocytic normochromic 03/05/2014   Altered mental state    Helicobacter pylori (H. pylori) infection 11/20/2012   Protein-calorie malnutrition, severe ([place]) 10/09/2012   Dysphagia 08/19/2012   Gastroparesis 08/19/2012   Bladder tumor 08/08/2012   Biliary dyskinesia 11/02/2010   PCP:  Hayden Rasmussen, MD Pharmacy:   [facility], [place] [address] Phone: [phone] Fax: [phone]     Social Determinants of Health (SDOH) Interventions    Readmission Risk Interventions No flowsheet data found.

## 2020-11-15 NOTE — Progress Notes (Signed)
EEG complete - results pending 

## 2020-11-15 NOTE — Evaluation (Signed)
Physical Therapy Evaluation Patient Details Name: ALEJANDO KOPPER MRN: [id number] DOB: 1965-05-26 Today's Date: 11/15/2020   History of Present Illness  55 y.o. male presenting to ED 9/4 with chest pain, SOB, L-sided numbness RUE>RLE  and decreased coordination. CT and MRI (-) for acute findings noting stable lacunar infarct of R inferior cerebellum. Patient admitted under observation for AKI and suspicion of TIA. Of note patient with blood glucose of 524 in ED. PMHx significant for similar episode 05/2020 given tPA (subsequent imaging (-) for acute findings), anxiety, tobacco use (2pk daily), benign essential tremor, bipolar 2 disorder, bladder cancer, COPD, CAD, Hx of seizures, poorly controlled DMII with neuropathy, RA, chronic nonhealing full tickness wound on R hand between thumb and index finger.  Clinical Impression   Pt admitted with above diagnosis. Comes from home where he lives with wife; Independent with amb and ADLs at baseline, some difficulty with flight of stairs to reach full bathroom; Presents to PT with generalized weakness bil LEs, decr activity tolerance, and UE deficits as outlined by OT;  Pt currently with functional limitations due to the deficits listed below (see PT Problem List). Pt will benefit from skilled PT to increase their independence and safety with mobility to allow discharge to the venue listed below.       Follow Up Recommendations Outpatient PT;Other (comment) (for strengthening, gait, and balance)    Equipment Recommendations  None recommended by PT (pretty well-equipped)    Recommendations for Other Services       Precautions / Restrictions Precautions Precautions: Fall      Mobility  Bed Mobility Overal bed mobility: Independent             General bed mobility comments: Pt very appropriately waits at EOB until he feels good enough to stand    Transfers Overall transfer level: Modified independent Equipment used: None                 Ambulation/Gait Ambulation/Gait assistance: Supervision Gait Distance (Feet): 15 Feet Assistive device: None (Occasional hand on furniture) Gait Pattern/deviations: Step-through pattern     General Gait Details: Demostrates good self-monitor for activity tolerance  Stairs         General stair comments: Discussed placing a shower seat with legs adjusted for stairs if he needs a seated rest break on teh steps  Wheelchair Mobility    Modified Rankin (Stroke Patients Only)       Balance Overall balance assessment: No apparent balance deficits (not formally assessed)                                           Pertinent Vitals/Pain Pain Assessment: No/denies pain    Home Living Family/patient expects to be discharged to:: Private residence Living Arrangements: Spouse/significant other Available Help at Discharge: Family;Available 24 hours/day Type of Home: House Home Access: Stairs to enter Entrance Stairs-Rails: Left (Ascending) Entrance Stairs-Number of Steps: 1 Home Layout: Two level;1/2 bath on main level;Bed/bath upstairs;Other (Comment) (Has been sleeping on couch on main level. Goes to 2nd level 1x weekly to shower.) Home Equipment: Cane - single point;Grab bars - tub/shower;Shower seat;Wheelchair - Education administrator (comment);Walker - 4 wheels Additional Comments: Patient reports lease is up soon with plan to move to 1-level home.    Prior Function Level of Independence: Needs assistance   Gait / Transfers Assistance Needed: Amb w/o AD, requires assist only  for steps to 2nd floor bed/bath with spouse  ADL's / Homemaking Assistance Needed: Mod-I with ADL's; able to make meals; drives; wife assists with med management        Hand Dominance   Dominant Hand: Left    Extremity/Trunk Assessment   Upper Extremity Assessment Upper Extremity Assessment: Defer to OT evaluation    Lower Extremity Assessment Lower Extremity Assessment: Generalized  weakness (WFL for simple amb; reports difficulty with stairs)       Communication   Communication: No difficulties  Cognition Arousal/Alertness: Awake/alert Behavior During Therapy: WFL for tasks assessed/performed Overall Cognitive Status: Within Functional Limits for tasks assessed                                 General Comments: Decreased insight.      General Comments General comments (skin integrity, edema, etc.): VSS on RA    Exercises     Assessment/Plan    PT Assessment Patient needs continued PT services  PT Problem List Decreased strength;Decreased activity tolerance;Decreased balance;Decreased mobility       PT Treatment Interventions DME instruction;Gait training;Stair training;Functional mobility training;Therapeutic activities;Therapeutic exercise;Balance training;Neuromuscular re-education;Patient/family education    PT Goals (Current goals can be found in the Care Plan section)  Acute Rehab PT Goals Patient Stated Goal: To regain strength in LUEand LEs PT Goal Formulation: With patient Time For Goal Achievement: 11/29/20 Potential to Achieve Goals: Good    Frequency Min 4X/week   Barriers to discharge        Co-evaluation               AM-PAC PT "6 Clicks" Mobility  Outcome Measure Help needed turning from your back to your side while in a flat bed without using bedrails?: None Help needed moving from lying on your back to sitting on the side of a flat bed without using bedrails?: None Help needed moving to and from a bed to a chair (including a wheelchair)?: None Help needed standing up from a chair using your arms (e.g., wheelchair or bedside chair)?: None Help needed to walk in hospital room?: None Help needed climbing 3-5 steps with a railing? : None 6 Click Score: 24    End of Session   Activity Tolerance: Patient tolerated treatment well Patient left: in bed;with call bell/phone within reach;Other (comment) (back to  trying to get a nap in) Nurse Communication: Mobility status PT Visit Diagnosis: Other abnormalities of gait and mobility (R26.89);Other symptoms and signs involving the nervous system (R29.898)    Time: 1255-1310 PT Time Calculation (min) (ACUTE ONLY): 15 min   Charges:   PT Evaluation $PT Eval Low Complexity: [place], PT  Acute Rehabilitation Services Pager [phone] Office [phone]   Colletta [place] 11/15/2020, 2:17 PM

## 2020-11-15 NOTE — Consult Note (Signed)
Neurology Consultation  Reason for Consult: Left arm numbness and weakness  Referring Physician: Dr. Bonner Puna  CC: Left arm numbness and weakness  History is obtained from: Chart review, Patient, Patient's wife at bedside  HPI: Vincent [name] is a 55 y.o. male with a medical history significant for coronary artery disease, hyperlipidemia, essential hypertension, rheumatoid arthritis, poorly controlled type 2 diabetes mellitus with peripheral neuropathy, TIA, right-sided numbness with resolution s/p tPA in March 2022 on home clopidogrel, COPD, tobacco use, history of bladder cancer, hypothyroidism, PTSD, bipolar disorder, gait abnormality at baseline and previous seizure-like activity with confusion and staring spells on lamotrigine 150 mg BID with last reported seizure-like activity in 2021 who presented to the ED 11/14/2020 for evaluation of left upper greater than lower extremity numbness. He states that he went to bed in his normal state of health on 11/13/2020 and woke up at 03:00 on 11/14/2020 with left sided numbness. He states that when he woke up, he was numb from his left hand to his left shoulder with left lower extremity numbness and weakness. He states that when he woke this morning, his left lower extremity numbness had improved but his left upper extremity numbness has been constant since onset. He noticed that he was dropping things with his left hand due to weakness as well prompting him to be seen at urgent care but was sent to the ED via EMS once at urgent care due to concern for stroke. On arrival, he was found to be hyperglycemic with a blood glucose of 542.  Of note, Vincent [name] was seen at [facility] in late March for evaluation of dysarthria, right-sided weakness, and right-sided numbness that resolved s/p tPA administration. At that time, MRI brain was negative for acute infarct, CTH was negative, and CT angiogram revealed widespread atherosclerosis of the head and neck with up to 50% stenosis  of right proximal internal carotid artery. His A1c was elevated at 11.4% and a severely elevated blood glucose up to 694 during that admission. He was worked up for acute stroke and discharged with reported resolution of right-sided symptoms. He was also seen in August of 2020 with similar symptoms of left sided numbness and weakness with a glucose reading of > 600 with suspected small vessel disease acute ischemic stroke and was worked up for stroke at that time as well with reported resolution of left-sided symptoms.  LKW: 11/13/2020 23:00 TNK given?: no, patient outside of thrombolytic therapy window on arrival   ROS: A complete ROS was performed and is negative except as noted in the HPI.   Past Medical History:  Diagnosis Date   Anxiety    Benign essential tremor    Bipolar 2 disorder [facility])    followed by [facility]--- dr s. Adele Schilder   Bladder cancer [facility])    recurrent   CAD (coronary artery disease)    cardiac cath 2003  and 2011 both showed normal coronary arteries w/ preserved lvf;  Non obstructive on CTA Oct 2019.    Chronic pain syndrome    back---- [facility] [place]   Cold extremities    BLE   COPD (chronic obstructive pulmonary disease) ([place])    DDD (degenerative disc disease), lumbar    Diabetic peripheral neuropathy ([place])    Gastroparesis    followed by dr Henrene Pastor   GERD (gastroesophageal reflux disease)    Hiatal hernia    History of bladder cancer urologist-  previously dr Consuella Lose;  now dr gay  papillay TCC (Ta G1)  s/p TURBT and chemo instillation 2014   History of chest pain 12/2017   heart cath normal   History of encephalopathy 05/27/2015   admission w/ acute encephalopathy thought to be secondary to pain meds and COPD   History of gastric ulcer    History of Helicobacter pylori infection    History of kidney stones    History of TIA (transient ischemic attack) 2008  and 10-19-2018    no residual's   History of traumatic head injury 2010   w/ LOC   per pt needed stitches, hit in head with a mower blade   Hyperlipidemia    Hypertension    Hypogonadism male    s/p  bilateral orchiectomy   Hypothyroidism    Insomnia    Mild obstructive sleep apnea    study in epic 12-04-2016, no cpap   PTSD (post-traumatic stress disorder)    chronic   PTSD (post-traumatic stress disorder)    RA (rheumatoid arthritis) ([place])    followed by [facility]   Seizures, transient [facility]) neurologist-  dr Krista Blue--  differential dx complex partial seizure .vs.  mood disorder .vs.  pseudoseizure--  negative EEG's   confusion episodes and staring spells since 11/ 2015   (03-26-2020 per pt wife last seizure 10 /2021)   Transient confusion NEUOROLOGIST-  DR Krista Blue   Episodes since 11/ 2015--  neurologist dx  differential complex partial seizure  .vs. mood disorder . vs. pseudoseizure   Type 2 diabetes mellitus treated with insulin [facility])    endocrinologist--- dr Loanne Drilling---  (03-26-2020 pt does not check blood sugar at home)   Past Surgical History:  Procedure Laterality Date   AMPUTATION Left 04/28/2020   Procedure: LEFT LITTLE FINGER AMPUTATION;  Surgeon: Newt Minion, MD;  Location: [place];  Service: Orthopedics;  Laterality: Left;   CARDIAC CATHETERIZATION  12-27-2001  DR Einar Gip  &  05-26-2009  DR Irish Lack   RESULTS FOR BOTH ARE NORMAL CORONARIES AND PERSERVED LVF/ EF 60%   CARPAL TUNNEL RELEASE Bilateral right 09-16-2003;  left ?   CARPAL TUNNEL RELEASE Left 02/25/2015   Procedure: LEFT CARPAL TUNNEL RELEASE;  Surgeon: Leanora Cover, MD;  Location: [place];  Service: Orthopedics;  Laterality: Left;   CYSTOSCOPY N/A 10/10/2012   Procedure: CYSTOSCOPY CLOT EVACUATION FULGERATION OF BLEEDERS ;  Surgeon: Claybon Jabs, MD;  Location: [facility];  Service: Urology;  Laterality: N/A;   CYSTOSCOPY WITH BIOPSY N/A 11/26/2015   Procedure: CYSTOSCOPY WITH BIOPSY AND FULGURATION;  Surgeon: Kathie Rhodes, MD;  Location: [place];  Service: Urology;  Laterality: N/A;   ESOPHAGOGASTRODUODENOSCOPY  2014   LAPAROSCOPIC CHOLECYSTECTOMY  11-17-2010   ORCHIECTOMY Right 02/21/2016   Procedure: SCROTAL ORCHIECTOMY with TESTICULAR PROSTHESIS IMPLANT;  Surgeon: Kathie Rhodes, MD;  Location: [facility];  Service: Urology;  Laterality: Right;   ORCHIECTOMY Left 09/02/2018   Procedure: ORCHIECTOMY;  Surgeon: Kathie Rhodes, MD;  Location: [facility];  Service: Urology;  Laterality: Left;   ROTATOR CUFF REPAIR Right 12/2004   TRANSURETHRAL RESECTION OF BLADDER TUMOR N/A 08/09/2012   Procedure: TRANSURETHRAL RESECTION OF BLADDER TUMOR (TURBT) WITH GYRUS WITH MITOMYCIN C;  Surgeon: Claybon Jabs, MD;  Location: [facility];  Service: Urology;  Laterality: N/A;   TRANSURETHRAL RESECTION OF BLADDER TUMOR N/A 03/29/2020   Procedure: TRANSURETHRAL RESECTION OF BLADDER TUMOR (TURBT) and post-op instillation of gemcitabine;  Surgeon: Janith Lima, MD;  Location: [place];  Service: Urology;  Laterality: N/A;   TRANSURETHRAL RESECTION OF BLADDER TUMOR WITH GYRUS (TURBT-GYRUS) N/A 02/27/2014   Procedure: TRANSURETHRAL RESECTION OF BLADDER TUMOR WITH GYRUS (TURBT-GYRUS);  Surgeon: Claybon Jabs, MD;  Location: [facility];  Service: Urology;  Laterality: N/A;   Family History  Problem Relation Age of Onset   Diabetes Mother    Diabetes Father    Hypertension Father    Heart attack Father 11       died age 45   Alcohol abuse Father    Colon cancer Neg Hx    Esophageal cancer Neg Hx    Stomach cancer Neg Hx    Rectal cancer Neg Hx    Social History:   reports that he has been smoking cigarettes. He has a 57.00 pack-year smoking history. He has never used smokeless tobacco. He reports that he does not drink alcohol and does not use drugs.  Medications  Current Facility-Administered Medications:    0.9 %  sodium chloride infusion, ,  Intravenous, Continuous, Tamala Julian, Rondell A, MD, Last Rate: 100 mL/hr at 11/15/20 0613, New Bag at 11/15/20 [id number]   acetaminophen (TYLENOL) tablet 650 mg, 650 mg, Oral, Q4H PRN **OR** acetaminophen (TYLENOL) 160 MG/5ML solution 650 mg, 650 mg, Per Tube, Q4H PRN **OR** acetaminophen (TYLENOL) suppository 650 mg, 650 mg, Rectal, Q4H PRN, Tamala Julian, Rondell A, MD   clopidogrel (PLAVIX) tablet 75 mg, 75 mg, Oral, Daily, Smith, Rondell A, MD, 75 mg at 11/15/20 0830   DULoxetine (CYMBALTA) DR capsule 60 mg, 60 mg, Oral, Daily, Smith, Rondell A, MD, 60 mg at 11/15/20 0828   enoxaparin (LOVENOX) injection 40 mg, 40 mg, Subcutaneous, Q24H, Smith, Rondell A, MD, 40 mg at 11/14/20 2223   fenofibrate tablet 160 mg, 160 mg, Oral, Daily, Smith, Rondell A, MD, 160 mg at 11/15/20 0831   insulin aspart (novoLOG) injection 0-15 Units, 0-15 Units, Subcutaneous, TID WC, Smith, Rondell A, MD, 15 Units at 11/15/20 0620   insulin glargine-yfgn (SEMGLEE) injection 60 Units, 60 Units, Subcutaneous, BID, Vance Gather B, MD   isosorbide mononitrate (IMDUR) 24 hr tablet 120 mg, 120 mg, Oral, Daily, Smith, Rondell A, MD, 120 mg at 11/15/20 0831   lamoTRIgine (LAMICTAL) tablet 150 mg, 150 mg, Oral, BID, Smith, Rondell A, MD, 150 mg at 11/15/20 0830   levothyroxine (SYNTHROID) tablet 25 mcg, 25 mcg, Oral, QAC breakfast, Fuller Plan A, MD, 25 mcg at 11/15/20 [id number]   lipase/protease/amylase (CREON) capsule 24,000 Units, 24,000 Units, Oral, Q breakfast **AND** lipase/protease/amylase (CREON) capsule 48,000 Units, 48,000 Units, Oral, Q supper, Smith, Rondell A, MD   lurasidone (LATUDA) tablet 40 mg, 40 mg, Oral, Q breakfast, Smith, Rondell A, MD, 40 mg at 11/15/20 0831   metoCLOPramide (REGLAN) tablet 10 mg, 10 mg, Oral, TID AC, Smith, Rondell A, MD, 10 mg at 11/15/20 0830   morphine (MS CONTIN) 12 hr tablet 60 mg, 60 mg, Oral, Q12H, Smith, Rondell A, MD, 60 mg at 11/14/20 2216   morphine (MSIR) tablet 15 mg, 15 mg, Oral, BID PRN, Tamala Julian,  Rondell A, MD   mupirocin cream (BACTROBAN) 2 %, , Topical, BID, Tamala Julian, Rondell A, MD, Given at 11/15/20 0840   nicotine (NICODERM CQ - dosed in mg/24 hours) patch 21 mg, 21 mg, Transdermal, Daily, Smith, Rondell A, MD, 21 mg at 11/15/20 0829   pantoprazole (PROTONIX) EC tablet 40 mg, 40 mg, Oral, Daily, Tamala Julian, Rondell A, MD, 40 mg at 11/15/20 0831   pregabalin (  LYRICA) capsule 225 mg, 225 mg, Oral, q AM, 225 mg at 11/15/20 0830 **AND** pregabalin (LYRICA) capsule 450 mg, 450 mg, Oral, QHS, Smith, Rondell A, MD, 450 mg at 11/14/20 2325   rosuvastatin (CRESTOR) tablet 10 mg, 10 mg, Oral, Daily, Tamala Julian, Rondell A, MD, 10 mg at 11/15/20 0829   sucralfate (CARAFATE) tablet 1 g, 1 g, Oral, BID, Tamala Julian, Rondell A, MD, 1 g at 11/15/20 0831   tamsulosin (FLOMAX) capsule 0.8 mg, 0.8 mg, Oral, QHS, Smith, Rondell A, MD   traZODone (DESYREL) tablet 150 mg, 150 mg, Oral, QHS, Smith, Rondell A, MD, 150 mg at 11/14/20 2214   venlafaxine XR (EFFEXOR-XR) 24 hr capsule 150 mg, 150 mg, Oral, Q breakfast, Tamala Julian, Rondell A, MD, 150 mg at 11/15/20 [id number]  Exam: Current vital signs: BP (!) 150/70 (BP Location: Left Arm)   Pulse 83   Temp 98.1 F (36.7 C) (Oral)   Resp 16   Ht 6' (1.829 m)   Wt 101.6 kg   SpO2 99%   BMI 30.38 kg/m  Vital signs in last 24 hours: Temp:  [97.6 F (36.4 C)-98.6 F (37 C)] 98.1 F (36.7 C) (09/05 0909) Pulse Rate:  [64-83] 83 (09/05 0909) Resp:  [12-17] 16 (09/05 0909) BP: (96-150)/(54-81) 150/70 (09/05 0909) SpO2:  [95 %-100 %] 99 % (09/05 0909) Weight:  [101.6 kg] 101.6 kg (09/04 1414)  GENERAL: Awake, alert, laying comfortably in bed, in no acute distress Psych: Affect appropriate for situation, patient is calm and cooperative with examination Head: Normocephalic and atraumatic, without obvious abnormality EENT: Normal conjunctivae, dry mucous membranes, poor dentition, no OP obstruction LUNGS: Normal respiratory effort. Non-labored breathing on room air. CV: Extremities  warm, well perfused without pedal edema ABDOMEN: Soft, non-tender, non-distended Extremities: left hand s/p 5th digit amputation, all other extremities without obvious deformity  NEURO:  Mental Status: Awake, alert, and oriented to person, place, and situation. He is able to state the month correctly and does state his age correctly but then states "I might be 79, I am not sure". He incorrectly states the year as 2023.  He is able to provide a clear and coherent history of present illness. Speech/Language: speech is mildly dysarthric possibly 2/2 poor dentition but states his speech is at baseline.    Naming, repetition, fluency, and comprehension intact without aphasia. No neglect is noted. Cranial Nerves:  II: PERRL 3 mm/brisk. Visual fields full.  III, IV, VI: EOMI without ptosis, gaze preference, or nystagmus V: Sensation is intact to light touch and symmetrical to face.  VII: Face is symmetric resting and smiling.  VIII: Hearing is intact to voice IX, X: Palate elevation is symmetric. Phonation normal.  XI: Normal sternocleidomastoid and trapezius muscle strength XII: Tongue protrudes midline without fasciculations.   Motor: There is subtle weakness of the left upper and lower extremities compared to the right.  Left grip strength 4/5, right grip strength 5/5.  Left biceps 5/5, triceps 4+/5. RUE 5/5 throughout, able to withstand antigravity position without vertical drift bilaterally.  Left ankle 4+/5 right ankle 5/5 with dorsiflexion and plantarflexion.  RLE 5/5 strength, LLE 4+/5 strength throughout.  Tone is normal. Bulk is normal.  Sensation: Sensation to light touch is decreased in the left upper extremity with distal > proximal numbness, intact on the right upper extremity. LLE with decreased sensation to light touch below the knee to the foot with normal sensation to the right lower extremity.  Sensation to cool temperature absent to left upper extremity  and intact on the right  upper extremity.  Coordination: FTN intact bilaterally. HKS intact bilaterally with mild dysmetria of the RLE.  DTRs: Patellae 1+ and symmetric, biceps 1+ and symmetric Plantars: Toes downgoing bilaterally  Gait: Deferred  NIHSS: 1a Level of Conscious.: 0 1b LOC Questions: 0 1c LOC Commands: 0 2 Best Gaze: 0 3 Visual: 0 4 Facial Palsy: 0 5a Motor Arm - left: 0 5b Motor Arm - Right: 0 6a Motor Leg - Left: 0 6b Motor Leg - Right: 0 7 Limb Ataxia: 0 8 Sensory: 2 9 Best Language: 0 10 Dysarthria: 1 11 Extinct. and Inatten.: 0 TOTAL: 3  Labs I have reviewed labs in epic and the results pertinent to this consultation are: CBC    Component Value Date/Time   WBC 5.4 11/15/2020 0248   RBC 4.06 (L) 11/15/2020 0248   HGB 11.4 (L) 11/15/2020 0248   HGB 16.5 11/22/2016 1130   HCT 33.7 (L) 11/15/2020 0248   HCT 49.7 11/22/2016 1130   PLT 181 11/15/2020 0248   PLT 249 11/22/2016 1130   MCV 83.0 11/15/2020 0248   MCV 90 11/22/2016 1130   MCH 28.1 11/15/2020 0248   MCHC 33.8 11/15/2020 0248   RDW 12.1 11/15/2020 0248   RDW 13.6 11/22/2016 1130   LYMPHSABS 2.6 11/15/2020 0248   LYMPHSABS 2.0 11/22/2016 1130   MONOABS 0.4 11/15/2020 0248   EOSABS 0.2 11/15/2020 0248   EOSABS 0.1 11/22/2016 1130   BASOSABS 0.1 11/15/2020 0248   BASOSABS 0.0 11/22/2016 1130  CMP     Component Value Date/Time   NA 134 (L) 11/15/2020 0248   NA 137 04/01/2015 1006   K 4.0 11/15/2020 0248   CL 103 11/15/2020 0248   CO2 25 11/15/2020 0248   GLUCOSE 320 (H) 11/15/2020 0248   BUN 24 (H) 11/15/2020 0248   BUN 14 04/01/2015 1006   CREATININE 1.73 (H) 11/15/2020 0248   CALCIUM 8.8 (L) 11/15/2020 0248   PROT 6.5 11/14/2020 1357   PROT 6.0 04/01/2015 1006   ALBUMIN 3.7 11/14/2020 1357   ALBUMIN 4.2 04/01/2015 1006   AST 13 (L) 11/14/2020 1357   ALT 16 11/14/2020 1357   ALKPHOS 67 11/14/2020 1357   BILITOT 0.3 11/14/2020 1357   BILITOT 0.3 04/01/2015 1006   GFRNONAA 47 (L) 11/15/2020 0248    GFRAA 46 (L) 08/09/2019 1206  Lipid Panel     Component Value Date/Time   CHOL 172 06/11/2020 0559   CHOL 337 (H) 12/18/2017 1000   TRIG 386 (H) 06/11/2020 0559   HDL 36 (L) 06/11/2020 0559   HDL 23 (L) 12/18/2017 1000   CHOLHDL 4.8 06/11/2020 0559   VLDL 77 (H) 06/11/2020 0559   LDLCALC 59 06/11/2020 0559   LDLCALC Comment 12/18/2017 1000   LDLDIRECT 141.9 (H) 10/20/2018 0403   Lab Results  Component Value Date   HGBA1C 11.8 (H) 11/14/2020   Imaging I have reviewed the images obtained:  CT-scan of the brain 11/14/2020: No acute findings  MRI examination of the brain 11/14/2020: 1. No acute intracranial abnormality or significant interval change. 2. Stable remote lacunar infarct of the inferior right cerebellum.  rEEG 11/15/2020: "This study is within normal limits. No seizures or epileptiform discharges were seen throughout the recording."  Echocardiogram 11/15/2020: 1. Left ventricular ejection fraction, by estimation, is 60 to 65%. The left ventricle has normal function. The left ventricle has no regional wall motion abnormalities. There is moderate left ventricular hypertrophy. Left ventricular diastolic parameters were normal.  2. Right ventricular systolic function is normal. The right ventricular size is normal.   3. The mitral valve is abnormal. Trivial mitral valve regurgitation. No evidence of mitral stenosis.   4. The aortic valve is tricuspid. There is mild calcification of the aortic valve. Aortic valve regurgitation is not visualized. Mild aortic valve sclerosis is present, with no evidence of aortic valve stenosis.   5. The inferior vena cava is normal in size with greater than 50% respiratory variability, suggesting right atrial pressure of 3 mmHg.   Assessment: 55 y.o. male who presented to the ED for evaluation of left sided weakness and numbness with onset 11/13/2020 at 03:00. MRI brain negative for acute ischemia, EEG within normal limits, CTH without acute findings.  Patient with blood glucose > 500 on arrival. Similar presentations of left-sided symptoms in August 2020 and right-sided symptoms March 2022 s/p tPA with resolution.  - Examination reveals patient with left upper > lower extremity and distal > proximal numbness with minimal left-sided weakness, dysarthria with speech at patient's baseline per patient and wife at bedside, and right lower extremity dysmetria with chronic gait instability.  - CTH without acute findings, MRI brain without acute intracranial abnormality with stable remote lacunar infarct of the inferior right cerebellum.  - Low suspicion for cervical spine etiology with unilateral upper extremity > lower extremity involvement. Also with FT, temp and proprioceptive deficits all on the left without crossed findings. Low suspicion for seizure etiology with no alteration in consciousness, no witnessed seizure activity, and in the setting of a normal EEG.  - TTE without findings to suggest a cardioembolic source - Overall presentation most consistent with right sided MRI negative stroke, most likely involving the thalamus.   Impression: Acute left upper > lower extremity distal > proximal numbness Poorly controlled type 2 diabetes mellitus with hyperglycemia; CBG > 500 on arrival, A1c 11.8% History of length dependent severe sensorimotor polyneuropathy 2/2 poorly controlled diabetes mellitus  Recommendations:   - Lipid panel - Repeat CTA to evaluate for worsening of R ICA stenosis identified in March.  - Blood glucose control per primary team - Prophylactic therapy- Antiplatelet med: continue home clopidogrel 75 mg PO daily. Add ASA to regimen. Will need to continue DAPT indefinitely.  - Risk factor modification - Management of comorbid conditions per primary team - Telemetry monitoring - PT consult, OT consult, Speech consult - Statin - BP management.   Pt seen by NP/Neuro and later by MD.  Anibal Henderson, AGAC-NP [facility] Pager: [phone]  I have seen and examined the patient. I have reviewed the assessment and recommendations, and have made amendations as indicated. 55 year old male with acute onset of left sided weakness and numbness. MRI brain negative for acute ischemia; overall exam and history are suggestive of a right thalamic lacunar stroke, however. Most likely component of the DDx is MRI negative stroke. EEG within normal limits. Patient with blood glucose > 500 on arrival. Recommendations as above.   Electronically signed: Dr. Kerney Elbe

## 2020-11-15 NOTE — Progress Notes (Signed)
  Echocardiogram 2D Echocardiogram has been performed.  Vincent [name] 11/15/2020, 10:32 AM

## 2020-11-15 NOTE — Progress Notes (Addendum)
PROGRESS NOTE  Vincent [name]  [id number] DOB: [birth year] DOA: 11/14/2020 PCP: Hayden Rasmussen, MD   Brief Narrative: Vincent [name] is a 55 y.o. left-handed male with medical history significant of hypertension, COPD, poorly controlled diabetes mellitus type 2, hypothyroidism, bladder cancer, rheumatoid arthritis, benign essential tremor, anxiety, bipolar 2 disorder, and possible seizure disorder presents with complaints of left-sided numbness.  Patient reports that he had gone to sleep that night around 11 PM in his normal state of health.  He states that he was awoken out of his sleep with complaints of chest pain and shortness of breath when he first noticed that he had numbness on his left side.  Patient reportedly went back to sleep and when he woke up at 9 AM this morning numbness spreading up the left arm.  Due to the symptoms he reports that he has been dropping things out of his hand and cannot use it.  He went to urgent care and was sent by EMS for concern of stroke.  Patient reports he had similar symptoms except for on his right side at that time.  At this time the patient does continue to smoke 2 packs of cigarettes per day average.   Records note patient was last hospitalized in March of this year with concern for acute ischemic stroke after presenting with complaints of acute onset of dysarthria with right-sided weakness and numbness.  CT scan of the brain did not show any acute abnormalities, but clinically findings were consistent with a stroke and patient was given tPA.  Post tPA subsequent MRI did not show any structural evidence of acute stroke.  During that hospitalization he had been found to have an acute kidney injury with hyponatremia and hyperglycemia which improved with IV.  It was suspected that patient had not been compliant with home diabetic regimen which appear to include Toujeo 180 units at that time.     ED Course: On admission into the emergency department patient  has stable vital signs.  CT scan of the head showed no acute abnormalities.  Patient was out of the window for tPA.  Labs significant for hemoglobin 12.3, sodium 130, potassium 5.2, BUN 28, creatinine 1.86, glucose 524, anion gap 8, and INR 0.9.  Neurology recommended admission for work-up of TIA.  Patient had been given 10 units of insulin x1 dose.  MRI had been obtained and negative for any acute abnormality. EEG was normal. Echocardiogram without cardioembolic source. Neurology is consulted for further recommendations due to persistence of symptoms.  Assessment & Plan: Principal Problem:   Stroke-like symptoms Active Problems:   HLD (hyperlipidemia)   Type 2 diabetes mellitus with hyperglycemia (HCC)   Hypothyroidism   Chronic pain   Tobacco use disorder   DM type 2 with diabetic peripheral neuropathy (HCC)   Hyperkalemia   AKI (acute kidney injury) ([place])  Stroke-like symptoms, hx CVA s/p tPA March 2022:  Patient presented with complaints of left-sided numbness and weakness. CT head, MRI brain showed no acute abnormalities. There was remote right inferior cerebellar lacunar infarct.  - Neurology consulted due to persistence of symptoms, suspicion of neuroimaging-negative CVA. Will follow recommendations for medication management and further work up. Currently on plavix '75mg'$ ,  - Echo without CES - EEG unremarkable.  - PT/OT recommending outpatient follow up.  - Continue statin.   Chest pain: Patient reports that he had initially been woken up out of his sleep due to complaints of chest pain and shortness of breath. ECG  without STEMI, shows early repolarization. Cardiac enzyme negative. Chest pain has durably resolved. No further workup planned as inpatient.    Acute kidney injury:  - Improved with gentle overnight IVF. CrCl >37m/min at this time.    Diabetes mellitus type 2, uncontrolled with hyperglycemia: HbA1c 11.8% and has been persistently very elevated recently and on admission.   - Follow up w/Dr. ELoanne Drillingrecommended. Home regimen includes glargine 180 units daily, metformin 500 mg twice daily, and Trulicity 4.5 mg weekly.  - Increased basal insulin this morning to 60u BID, will increase dose to 80u BID, augment to resistant SSI, HS coverage added as well. Would certainly benefit from better control but aiming to avoid hypoglycemia.    Hyperkalemia: Acute, resolved.     Hyperlipidemia: LDL 59. - Continue fenofibrate and crestor   Wound of right hand: Patient reports having open wound since November of last year on the right hand. - Keep covered w/mupirocin ointment to avoid infection (not currently infected). Needs better glycemic control to heal.   Chronic pancreatitis:  - Continue enzyme replacement  Chronic pain and opioid use - Continue home pain medication regimen   Hypothyroidism: TSH 1.831 - Continue levothyroxine   Seizure disorder:  - Continue lamictal. Nl EEG.  History of bladder cancer: Initially diagnosed in 2017 and being followed by Dr. GAbner Greenspan - Continue outpatient follow-up with urology   Tobacco abuse: Patient reports medical history 5 cigarettes/day on average. - Continue to counsel patient on need of cessation of tobacco use  Obesity: Estimated body mass index is 30.38 kg/m as calculated from the following:   Height as of this encounter: 6' (1.829 m).   Weight as of this encounter: 101.6 kg.  DVT prophylaxis: Lovenox Code Status: Full Family Communication: At bedside Disposition Plan:  Status is: Observation. Anticipated d/c is to: Home pending neurology clearance.              Patient currently is not medically stable to d/c.   Consultants:  Neurology  Procedures:  EEG 11/15/2020: "This study is within normal limits. No seizures or epileptiform discharges were seen throughout the recording."   Echocardiogram 11/15/2020: 1. Left ventricular ejection fraction, by estimation, is 60 to 65%. The left ventricle has normal function.  The left ventricle has no regional wall motion abnormalities. There is moderate left ventricular hypertrophy. Left ventricular diastolic parameters were normal.   2. Right ventricular systolic function is normal. The right ventricular size is normal.   3. The mitral valve is abnormal. Trivial mitral valve regurgitation. No evidence of mitral stenosis.   4. The aortic valve is tricuspid. There is mild calcification of the aortic valve. Aortic valve regurgitation is not visualized. Mild aortic valve sclerosis is present, with no evidence of aortic valve stenosis.   5. The inferior vena cava is normal in size with greater than 50% respiratory variability, suggesting right atrial pressure of 3 mmHg.   Antimicrobials: None   Subjective: He's left handed and having to use his right hand due to difficulty with hand movements, weakness, clumsiness, and numbness described as abnormal sensation but not full numbness involving the entire arm from hand (worst) to shoulder. No new neck pain/injury. Left anterior leg was also involved initially but has somewhat improved and is not nearly as troublesome.  Objective: Vitals:   11/15/20 0433 11/15/20 0909 11/15/20 1228 11/15/20 1228  BP: (!) 133/54 (!) 150/70 131/63 131/63  Pulse: 69 83 73 72  Resp: '17 16 16 16  '$ Temp: 98.2 F (  36.8 C) 98.1 F (36.7 C) 97.9 F (36.6 C) 97.9 F (36.6 C)  TempSrc: Oral Oral Oral Oral  SpO2: 98% 99% 99% 100%  Weight:      Height:        Intake/Output Summary (Last 24 hours) at 11/15/2020 1715 Last data filed at 11/15/2020 1200 Gross per 24 hour  Intake 2200.17 ml  Output --  Net 2200.17 ml   Filed Weights   11/14/20 1414  Weight: 101.6 kg   Gen: 55 y.o. male in no distress Pulm: Non-labored breathing. Clear to auscultation bilaterally.  CV: Regular rate and rhythm. No murmur, rub, or gallop. No JVD, no pedal edema. GI: Abdomen soft, non-tender, non-distended, with normoactive bowel sounds. No organomegaly or masses  felt. Ext: Warm, left 5th digit absent. Skin: Right hand w/nonhealing wound in 1st interdigital space without erythema, exudate. Neuro: Alert and oriented. Apraxia of left hand > wrist and somewhat elbow. Full AROM of all joints from shoulder to digits with subtle weakness compared to right and abnormal sensation throughout.   Psych: Judgement and insight appear normal. Mood & affect appropriate.   Data Reviewed: I have personally reviewed following labs and imaging studies  CBC: Recent Labs  Lab 11/14/20 1357 11/15/20 0248  WBC 7.3 5.4  NEUTROABS 3.8 2.1  HGB 12.3* 11.4*  HCT 36.4* 33.7*  MCV 83.9 83.0  PLT 205 0000000   Basic Metabolic Panel: Recent Labs  Lab 11/14/20 1357 11/15/20 0248  NA 130* 134*  K 5.2* 4.0  CL 96* 103  CO2 26 25  GLUCOSE 542* 320*  BUN 28* 24*  CREATININE 1.86* 1.73*  CALCIUM 9.2 8.8*   GFR: Estimated Creatinine Clearance: 60.9 mL/min (A) (by C-G formula based on SCr of 1.73 mg/dL (H)). Liver Function Tests: Recent Labs  Lab 11/14/20 1357  AST 13*  ALT 16  ALKPHOS 67  BILITOT 0.3  PROT 6.5  ALBUMIN 3.7   No results for input(s): LIPASE, AMYLASE in the last 168 hours. No results for input(s): AMMONIA in the last 168 hours. Coagulation Profile: Recent Labs  Lab 11/14/20 1357  INR 0.9   Cardiac Enzymes: No results for input(s): CKTOTAL, CKMB, CKMBINDEX, TROPONINI in the last 168 hours. BNP (last 3 results) No results for input(s): PROBNP in the last 8760 hours. HbA1C: Recent Labs    11/14/20 1841  HGBA1C 11.8*   CBG: Recent Labs  Lab 11/15/20 0039 11/15/20 0433 11/15/20 0611 11/15/20 1105 11/15/20 1558  GLUCAP 347* 308* 360* 384* 256*   Lipid Profile: No results for input(s): CHOL, HDL, LDLCALC, TRIG, CHOLHDL, LDLDIRECT in the last 72 hours. Thyroid Function Tests: Recent Labs    11/14/20 1841  TSH 1.831   Anemia Panel: No results for input(s): VITAMINB12, FOLATE, FERRITIN, TIBC, IRON, RETICCTPCT in the last 72  hours. Urine analysis:    Component Value Date/Time   COLORURINE STRAW (A) 01/07/2020 1756   APPEARANCEUR CLEAR 01/07/2020 1756   LABSPEC 1.029 01/07/2020 1756   PHURINE 6.0 01/07/2020 1756   GLUCOSEU >=500 (A) 01/07/2020 1756   HGBUR NEGATIVE 01/07/2020 1756   BILIRUBINUR NEGATIVE 01/07/2020 1756   KETONESUR NEGATIVE 01/07/2020 1756   PROTEINUR NEGATIVE 01/07/2020 1756   UROBILINOGEN 1.0 03/04/2014 2312   NITRITE NEGATIVE 01/07/2020 1756   LEUKOCYTESUR NEGATIVE 01/07/2020 1756   No results found for this or any previous visit (from the past 240 hour(s)).    Radiology Studies: CT HEAD WO CONTRAST  Result Date: 11/14/2020 CLINICAL DATA:  Acute neuro deficit, possible stroke  EXAM: CT HEAD WITHOUT CONTRAST TECHNIQUE: Contiguous axial images were obtained from the base of the skull through the vertex without intravenous contrast. COMPARISON:  06/11/2020 FINDINGS: Brain: No evidence of acute infarction, hemorrhage, hydrocephalus, extra-axial collection or mass lesion/mass effect. Vascular: Atherosclerotic and physiologic intracranial calcifications. Skull: Negative Sinuses/Orbits: Negative Other: None IMPRESSION: No acute findings Electronically Signed   By: Lucrezia Europe M.D.   On: 11/14/2020 15:05   MR BRAIN WO CONTRAST  Result Date: 11/14/2020 CLINICAL DATA:  Stroke suspected. New onset of left upper and lower extremity weakness. EXAM: MRI HEAD WITHOUT CONTRAST TECHNIQUE: Multiplanar, multiecho pulse sequences of the brain and surrounding structures were obtained without intravenous contrast. COMPARISON:  CT head without contrast 11/14/2020. MR head without contrast 06/11/2020. FINDINGS: Brain: No acute infarct, hemorrhage, or mass lesion is present. No significant white matter lesions are present. The ventricles are of normal size. No significant extraaxial fluid collection is present. A remote lacunar infarct in the inferior right cerebellum is stable. No new lesions are present. Brainstem and  cerebellum are otherwise within normal limits. The internal auditory canals are within normal limits. Vascular: Flow is present in the major intracranial arteries. Skull and upper cervical spine: The craniocervical junction is normal. Upper cervical spine is within normal limits. Marrow signal is unremarkable. Sinuses/Orbits: The paranasal sinuses and mastoid air cells are clear. The globes and orbits are within normal limits. IMPRESSION: 1. No acute intracranial abnormality or significant interval change. 2. Stable remote lacunar infarct of the inferior right cerebellum. Electronically Signed   By: San Morelle M.D.   On: 11/14/2020 16:33   EEG adult  Result Date: 11/15/2020 Lora Havens, MD     11/15/2020 10:25 AM Patient Name: EVREN ABRAHA MRN: [id number] Epilepsy Attending: Lora Havens Referring Physician/Provider: Dr Fuller Plan Date: 11/15/2020 Duration: 23.09 mins Patient history: 55yo M presented with complaints of left-sided numbness and weakness. CT scan and subsequent MRI of the brain showed no acute abnormalities. EEG to evaluate for seizure. Level of alertness: Awake AEDs during EEG study: LTG, PGB Technical aspects: This EEG study was done with scalp electrodes positioned according to the 10-20 International system of electrode placement. Electrical activity was acquired at a sampling rate of '500Hz'$  and reviewed with a high frequency filter of '70Hz'$  and a low frequency filter of '1Hz'$ . EEG data were recorded continuously and digitally stored. Description: The posterior dominant rhythm consists of 8.5 Hz activity of moderate voltage (25-35 uV) seen predominantly in posterior head regions, symmetric and reactive to eye opening and eye closing. Hyperventilation and photic stimulation were not performed.   IMPRESSION: This study is within normal limits. No seizures or epileptiform discharges were seen throughout the recording. Lora Havens   ECHOCARDIOGRAM COMPLETE  Result Date:  11/15/2020    ECHOCARDIOGRAM REPORT   Patient Name:   Vincent [name] [facility] Date of Exam: 11/15/2020 Medical Rec #:  [id number]       Height:       72.0 in Accession #:    [id number]      Weight:       224.0 lb Date of Birth:  [birth year]        BSA:          2.236 m Patient Age:    14 years        BP:           150/70 mmHg Patient Gender: M  HR:           69 bpm. Exam Location:  Inpatient Procedure: 2D Echo, Cardiac Doppler and Color Doppler Indications:    Chest pain  History:        Patient has prior history of Echocardiogram examinations, most                 recent 06/11/2020. CAD, COPD; Risk Factors:Diabetes, Hypertension,                 Dyslipidemia and Current Smoker.  Sonographer:    Clayton Lefort RDCS (AE) Referring Phys: [id number] RONDELL A SMITH IMPRESSIONS  1. Left ventricular ejection fraction, by estimation, is 60 to 65%. The left ventricle has normal function. The left ventricle has no regional wall motion abnormalities. There is moderate left ventricular hypertrophy. Left ventricular diastolic parameters were normal.  2. Right ventricular systolic function is normal. The right ventricular size is normal.  3. The mitral valve is abnormal. Trivial mitral valve regurgitation. No evidence of mitral stenosis.  4. The aortic valve is tricuspid. There is mild calcification of the aortic valve. Aortic valve regurgitation is not visualized. Mild aortic valve sclerosis is present, with no evidence of aortic valve stenosis.  5. The inferior vena cava is normal in size with greater than 50% respiratory variability, suggesting right atrial pressure of 3 mmHg. FINDINGS  Left Ventricle: Left ventricular ejection fraction, by estimation, is 60 to 65%. The left ventricle has normal function. The left ventricle has no regional wall motion abnormalities. The left ventricular internal cavity size was normal in size. There is  moderate left ventricular hypertrophy. Left ventricular diastolic parameters were normal. Right  Ventricle: The right ventricular size is normal. No increase in right ventricular wall thickness. Right ventricular systolic function is normal. Left Atrium: Left atrial size was normal in size. Right Atrium: Right atrial size was normal in size. Pericardium: There is no evidence of pericardial effusion. Mitral Valve: The mitral valve is abnormal. There is mild thickening of the mitral valve leaflet(s). There is mild calcification of the mitral valve leaflet(s). Trivial mitral valve regurgitation. No evidence of mitral valve stenosis. MV peak gradient, 3.2 mmHg. The mean mitral valve gradient is 2.0 mmHg. Tricuspid Valve: The tricuspid valve is normal in structure. Tricuspid valve regurgitation is mild . No evidence of tricuspid stenosis. Aortic Valve: The aortic valve is tricuspid. There is mild calcification of the aortic valve. Aortic valve regurgitation is not visualized. Mild aortic valve sclerosis is present, with no evidence of aortic valve stenosis. Aortic valve mean gradient measures 6.0 mmHg. Aortic valve peak gradient measures 10.4 mmHg. Aortic valve area, by VTI measures 3.13 cm. Pulmonic Valve: The pulmonic valve was normal in structure. Pulmonic valve regurgitation is not visualized. No evidence of pulmonic stenosis. Aorta: The aortic root is normal in size and structure. Venous: The inferior vena cava is normal in size with greater than 50% respiratory variability, suggesting right atrial pressure of 3 mmHg. IAS/Shunts: The interatrial septum was not well visualized.  LEFT VENTRICLE PLAX 2D LVIDd:         4.70 cm  Diastology LVIDs:         2.60 cm  LV e' medial:    11.40 cm/s LV PW:         1.40 cm  LV E/e' medial:  8.2 LV IVS:        1.60 cm  LV e' lateral:   13.40 cm/s LVOT diam:     2.40 cm  LV E/e' lateral: 7.0 LV SV:         101 LV SV Index:   45 LVOT Area:     4.52 cm  RIGHT VENTRICLE             IVC RV Basal diam:  2.30 cm     IVC diam: 1.70 cm RV S prime:     15.90 cm/s TAPSE (M-mode): 2.1  cm LEFT ATRIUM             Index       RIGHT ATRIUM           Index LA diam:        3.80 cm 1.70 cm/m  RA Area:     12.50 cm LA Vol (A2C):   50.6 ml 22.63 ml/m RA Volume:   26.10 ml  11.67 ml/m LA Vol (A4C):   41.5 ml 18.56 ml/m LA Biplane Vol: 45.9 ml 20.53 ml/m  AORTIC VALVE AV Area (Vmax):    3.18 cm AV Area (Vmean):   2.99 cm AV Area (VTI):     3.13 cm AV Vmax:           161.00 cm/s AV Vmean:          111.000 cm/s AV VTI:            0.324 m AV Peak Grad:      10.4 mmHg AV Mean Grad:      6.0 mmHg LVOT Vmax:         113.00 cm/s LVOT Vmean:        73.400 cm/s LVOT VTI:          0.224 m LVOT/AV VTI ratio: 0.69  AORTA Ao Root diam: 3.80 cm MITRAL VALVE MV Area (PHT): 2.43 cm    SHUNTS MV Area VTI:   4.03 cm    Systemic VTI:  0.22 m MV Peak grad:  3.2 mmHg    Systemic Diam: 2.40 cm MV Mean grad:  2.0 mmHg MV Vmax:       0.90 m/s MV Vmean:      58.2 cm/s MV Decel Time: 312 msec MV E velocity: 93.80 cm/s MV A velocity: 90.10 cm/s MV E/A ratio:  1.04 Jenkins Rouge MD Electronically signed by Jenkins Rouge MD Signature Date/Time: 11/15/2020/10:41:42 AM    Final     Scheduled Meds:  clopidogrel  75 mg Oral Daily   DULoxetine  60 mg Oral Daily   enoxaparin (LOVENOX) injection  40 mg Subcutaneous Q24H   fenofibrate  160 mg Oral Daily   insulin aspart  0-15 Units Subcutaneous TID WC   insulin glargine-yfgn  60 Units Subcutaneous BID   isosorbide mononitrate  120 mg Oral Daily   lamoTRIgine  150 mg Oral BID   levothyroxine  25 mcg Oral QAC breakfast   lipase/protease/amylase  24,000 Units Oral Q breakfast   And   lipase/protease/amylase  48,000 Units Oral Q supper   lurasidone  40 mg Oral Q breakfast   metoCLOPramide  10 mg Oral TID AC   morphine  60 mg Oral Q12H   mupirocin cream   Topical BID   nicotine  21 mg Transdermal Daily   pantoprazole  40 mg Oral Daily   pregabalin  225 mg Oral q AM   And   pregabalin  450 mg Oral QHS   rosuvastatin  10 mg Oral Daily   sucralfate  1 g Oral BID    tamsulosin  0.8 mg Oral QHS  traZODone  150 mg Oral QHS   venlafaxine XR  150 mg Oral Q breakfast   Continuous Infusions:  sodium chloride 100 mL/hr at 11/15/20 0613     LOS: 0 days   Time spent: 35 minutes.  Patrecia Pour, MD [facility] [email or web address] 11/15/2020, 5:15 PM

## 2020-11-16 DIAGNOSIS — R531 Weakness: Secondary | ICD-10-CM | POA: Diagnosis not present

## 2020-11-16 DIAGNOSIS — E1142 Type 2 diabetes mellitus with diabetic polyneuropathy: Secondary | ICD-10-CM | POA: Diagnosis not present

## 2020-11-16 DIAGNOSIS — N179 Acute kidney failure, unspecified: Secondary | ICD-10-CM | POA: Diagnosis not present

## 2020-11-16 DIAGNOSIS — I6330 Cerebral infarction due to thrombosis of unspecified cerebral artery: Secondary | ICD-10-CM | POA: Insufficient documentation

## 2020-11-16 DIAGNOSIS — G894 Chronic pain syndrome: Secondary | ICD-10-CM | POA: Diagnosis not present

## 2020-11-16 DIAGNOSIS — E785 Hyperlipidemia, unspecified: Secondary | ICD-10-CM | POA: Diagnosis not present

## 2020-11-16 LAB — RAPID URINE DRUG SCREEN, HOSP PERFORMED
Amphetamines: NOT DETECTED
Barbiturates: NOT DETECTED
Benzodiazepines: NOT DETECTED
Cocaine: NOT DETECTED
Opiates: POSITIVE — AB
Tetrahydrocannabinol: NOT DETECTED

## 2020-11-16 LAB — GLUCOSE, CAPILLARY
Glucose-Capillary: 75 mg/dL (ref 70–99)
Glucose-Capillary: 187 mg/dL — ABNORMAL HIGH (ref 70–99)

## 2020-11-16 LAB — LIPID PANEL
Cholesterol: 240 mg/dL — ABNORMAL HIGH (ref 0–200)
HDL: 38 mg/dL — ABNORMAL LOW (ref >40)
LDL Cholesterol: 137 mg/dL — ABNORMAL HIGH (ref 0–99)
Total CHOL/HDL Ratio: 6.3 RATIO
Triglycerides: 325 mg/dL — ABNORMAL HIGH (ref <150)
VLDL: 65 mg/dL — ABNORMAL HIGH (ref 0–40)

## 2020-11-16 MED ORDER — INSULIN GLARGINE-YFGN 100 UNIT/ML ~~LOC~~ SOLN
40.0000 [IU] | Freq: Two times a day (BID) | SUBCUTANEOUS | Status: DC
  Administered 2020-11-16: 40 [IU] via SUBCUTANEOUS
  Filled 2020-11-16 (×2): qty 0.4

## 2020-11-16 MED ORDER — ASPIRIN 81 MG PO TBEC: 81.0000 mg | DELAYED_RELEASE_TABLET | Freq: Every day | ORAL | 0 refills | Status: DC

## 2020-11-16 MED ORDER — ROSUVASTATIN CALCIUM 20 MG PO TABS
20.0000 mg | ORAL_TABLET | Freq: Every day | ORAL | Status: DC
  Administered 2020-11-16: 20 mg via ORAL
  Filled 2020-11-16: qty 1

## 2020-11-16 MED ORDER — ROSUVASTATIN CALCIUM 10 MG PO TABS: 10.0000 mg | ORAL_TABLET | Freq: Every day | ORAL | 0 refills | Status: DC

## 2020-11-16 MED ORDER — ASPIRIN EC 81 MG PO TBEC
81.0000 mg | DELAYED_RELEASE_TABLET | Freq: Every day | ORAL | Status: DC
  Administered 2020-11-16: 81 mg via ORAL
  Filled 2020-11-16: qty 1

## 2020-11-16 NOTE — Progress Notes (Signed)
PT Cancellation Note  Patient Details Name: COURVOISIER VREDEVELD MRN: [id number] DOB: 02/21/1966   Cancelled Treatment:    Reason Eval/Treat Not Completed: Other (comment). Pt discharging. Wife just leaving to get the car and pt awaiting wheelchair transport downstairs. Declining any further PT needs prior to d/c.    Lorriane Shire 11/16/2020, 12:02 PM  Lorrin Goodell, PT  Office # [phone] Pager [phone]

## 2020-11-16 NOTE — Progress Notes (Addendum)
Inpatient Diabetes Program Recommendations  AACE/ADA: New Consensus Statement on Inpatient Glycemic Control (2015)  Target Ranges:  Prepandial:   less than 140 mg/dL      Peak postprandial:   less than 180 mg/dL (1-2 hours)      Critically ill patients:  140 - 180 mg/dL   Lab Results  Component Value Date   GLUCAP 75 11/16/2020   HGBA1C 11.8 (H) 11/14/2020   Results for JEARL, RETTERER (MRN [id number]) as of 11/16/2020 09:23  Ref. Range 11/15/2020 06:11 11/15/2020 11:05 11/15/2020 15:58 11/15/2020 21:11 11/16/2020 06:13  Glucose-Capillary Latest Ref Range: 70 - 99 mg/dL 360 (H) 384 (H) 256 (H) 201 (H) 75    Review of Glycemic Control  Diabetes history: type 2 Outpatient Diabetes medications: toujeo 180 units daily, Trulicity 4.5 mg weekly, Metformin 500 mg BID Current orders for Inpatient glycemic control: Semglee 80 units BID, Novolog RESISTANT correction scale TID & HS scale  Inpatient Diabetes Program Recommendations:   Noted that fasting blood sugar was 75 mg/dl this am after getting Semglee 80 units last night. Inpatient insulin dosages in April, 2022 were much less while in the hospital.   Recommend holding AM Semglee today. Recommend decreasing Semglee to 30 units BID (starting tonight at HS) and Novolog correction scale to MODERATE (0-15 units) TID and continuing Novolog HS scale (0-5 units).   ADDENDUM: spoke with patient and his wife at the bedside. Patient takes Toujeo 100 units every am. States that Dr. Loanne Drilling had changed his dosage. States that he takes it everyday and checks blood sugars 2-3 times per day. Patient is anxious to go home.    Harvel Ricks RN BSN CDE Diabetes Coordinator Pager: [phone]  8am-5pm

## 2020-11-16 NOTE — Discharge Summary (Signed)
Physician Discharge Summary  Vincent [name] [id number] DOB: Jan 27, 1966 DOA: 11/14/2020  PCP: Hayden Rasmussen, MD  Admit date: 11/14/2020 Discharge date: 11/16/2020  Admitted From: Home Disposition: Home   Recommendations for Outpatient Follow-up:  Follow up with PCP in 1-2 weeks for continued tobacco cessation counseling.  Continue DAPT x3 weeks, then plavix alone (symptoms concerning for either MRI-negative thalamic stroke or [with recurrent presentations for focal deficits] with severe hyperglycemia) Follow up with endocrinology for improved glucose control. Follow up with stroke neurology at [facility] in 4 weeks. Referral placed.   Home Health: None - outpatient PT/OT Equipment/Devices: None Discharge Condition: Stable CODE STATUS: Full Diet recommendation: Heart healthy, carb-modified  Brief/Interim Summary: Vincent [name] is a 55 year old male with history of CAD, hypertension, hyperlipidemia, RA, diabetes, TIA, stroke, COPD, smoker, bipolar disorder, bladder cancer, seizure on Lamictal admitted for left-sided numbness and weakness.   In 10/2018 he was admitted for left-sided weakness and numbness MRI no acute infarct.  CTA head and neck right ICA 50% stenosis.  EF 55 to 60%, A1c 11.4 UDS negative.  TG 816, LDL 142.  He was discharged on DAPT and Lipitor 40 and fenofibrate 160.   In 05/2020 he was admitted for right-sided numbness weakness, right facial droop, slurred speech.  Glucose more than 600, status post tPA.  MRI negative for acute stroke.  CT head and neck right ICA 50% stenosis with diffuse atherosclerosis intracranially.  A1c 11.4.   On this admission glucose 542, MRI again negative for acute infarct.  EF 60 to 65%, LDL 137, A1c 11.8, TG 325.  Creatinine 1.73.   Patient has been admitted several times with focal neurologic deficit and hyperglycemia > 500.  MRI x3 negative for infarct.  CTA head and neck stable with right ICA 50% stenosis over time.  Etiology per patient symptoms  concerning for TIA given risk factors but also concerning for focal deficit due to hyperglycemia.  Recommend to continue aspirin 81 Plavix 75 DAPT for 3 weeks and then Plavix alone.  Continue fenofibrate 160, and will continue crestor '10mg'$  (pt not very reliable taking this PTA).  Need to quit smoking and close PCP follow-up for better DM control.  Will have stroke follow-up at [facility] in 4 weeks.  Discharge Diagnoses:  Principal Problem:   Stroke-like symptoms Active Problems:   HLD (hyperlipidemia)   Type 2 diabetes mellitus with hyperglycemia (HCC)   Hypothyroidism   Chronic pain   Tobacco use disorder   DM type 2 with diabetic peripheral neuropathy (HCC)   Hyperkalemia   AKI (acute kidney injury) ([place])   Cerebral thrombosis with cerebral infarction   Discharge Instructions Discharge Instructions     Ambulatory referral to Neurology   Complete by: As directed    An appointment is requested in approximately: 4 weeks   Ambulatory referral to Occupational Therapy   Complete by: As directed    Ambulatory referral to Physical Therapy   Complete by: As directed    Diet - low sodium heart healthy   Complete by: As directed    Diet Carb Modified   Complete by: As directed    Discharge instructions   Complete by: As directed    You were admitted for left arm and to a lesser extent, leg numbness thought to be due to a stroke that was not definitively seen on neuroimaging. You will need to reduce your risk of future stroke by controlling blood sugar better, taking crestor '10mg'$  daily to reduce cholesterol, and taking  aspirin '81mg'$  in addition to the plavix you were taking. Please follow up with your primary doctor and Dr. Loanne Drilling for continued treatment of chronic conditions. Contact neurology clinic (number provided) in the next week if you are not contacted to schedule a follow up appointment. Or seek medical attention right away if your symptoms worsen or you notice any new numbness or weakness  or speech changes.   Increase activity slowly   Complete by: As directed    No wound care   Complete by: As directed       Allergies as of 11/16/2020       Reactions   Celebrex [celecoxib] Anaphylaxis, Rash   Hydrocodone Rash, Other (See Comments)   "blisters developed on arms"   Sulfa Antibiotics Rash        Medication List     TAKE these medications    aspirin 81 MG EC tablet Take 1 tablet (81 mg total) by mouth daily. Swallow whole.   clopidogrel 75 MG tablet Commonly known as: PLAVIX Take 1 tablet (75 mg total) by mouth daily.   DULoxetine 60 MG capsule Commonly known as: Cymbalta Take 1 capsule (60 mg total) by mouth daily.   fenofibrate 160 MG tablet Take 160 mg by mouth daily.   FreeStyle Libre 2 Sensor Misc 1 Device by Does not apply route every 14 (fourteen) days. What changed: additional instructions   isosorbide mononitrate 120 MG 24 hr tablet Commonly known as: IMDUR Take 1 tablet (120 mg total) by mouth daily. Please make annual appt in April with Dr. Percival Spanish for refills. Thank you What changed: additional instructions   lamoTRIgine 150 MG tablet Commonly known as: LAMICTAL Take 1 tablet (150 mg total) by mouth 2 (two) times daily.   levothyroxine 25 MCG tablet Commonly known as: SYNTHROID Take 25 mcg by mouth daily before breakfast.   lurasidone 40 MG Tabs tablet Commonly known as: Latuda Take 1 tablet (40 mg total) by mouth daily with breakfast.   metFORMIN 500 MG 24 hr tablet Commonly known as: GLUCOPHAGE-XR Take 500 mg by mouth 2 (two) times daily.   metoCLOPramide 10 MG tablet Commonly known as: REGLAN Take by mouth 3 (three) times daily with meals.   morphine 15 MG tablet Commonly known as: MSIR Take 15 mg by mouth 2 (two) times daily as needed. For breakthrough pain  (03-26-2020 per pt wife pt takes this twice also   morphine 60 MG 12 hr tablet Commonly known as: MS CONTIN Take 60 mg by mouth 2 (two) times daily. Every 12  hours   omeprazole 40 MG capsule Commonly known as: PRILOSEC Take 40 mg by mouth daily.   pregabalin 225 MG capsule Commonly known as: Lyrica Take one cap midday, one cap in evening. What changed:  how much to take how to take this when to take this additional instructions   pregabalin 225 MG capsule Commonly known as: LYRICA Take 1 capsule (225 mg total) by mouth every morning. What changed: Another medication with the same name was changed. Make sure you understand how and when to take each.   rosuvastatin 10 MG tablet Commonly known as: CRESTOR Take 1 tablet (10 mg total) by mouth daily.   sucralfate 1 g tablet Commonly known as: CARAFATE Take 1 g by mouth 2 (two) times daily.   tamsulosin 0.4 MG Caps capsule Commonly known as: FLOMAX Take 0.8 mg by mouth at bedtime.   Toujeo Max SoloStar 300 UNIT/ML Solostar Pen Generic drug: insulin glargine (  2 Unit Dial) Inject 180 Units into the skin every morning. And pen needles 1/day What changed:  when to take this additional instructions   traZODone 150 MG tablet Commonly known as: DESYREL Take 1 tablet (150 mg total) by mouth at bedtime.   Trulicity 4.5 0000000 Sopn Generic drug: Dulaglutide Inject 4.5 mg into the skin once a week. What changed: when to take this   venlafaxine XR 150 MG 24 hr capsule Commonly known as: EFFEXOR-XR Take 1 capsule (150 mg total) by mouth daily with breakfast.   Vitamin D (Ergocalciferol) 1.25 MG (50000 UNIT) Caps capsule Commonly known as: DRISDOL Take 50,000 Units by mouth every Wednesday.   Zenpep 5000-24000 units Cpep Generic drug: Pancrelipase (Lip-Prot-Amyl) Take 1-2 capsules by mouth See admin instructions. Takes 1 capsule in the morning and 2 capsules at night        Follow-up [place]. Schedule an appointment as soon as possible for a visit.   Specialty: Rehabilitation Why: PLease call the outpatient  therapy and schedule an appointment Contact information: [address] [place] [id number] [place]        Hayden Rasmussen, MD Follow up.   Specialty: Family Medicine Contact information: [place] [address] [phone]         Renato Shin, MD Follow up.   Specialty: Endocrinology Contact information: [address] [place] [address] [phone]         [facility] Follow up.   Specialty: Neurology Contact information: [address] [place] [phone]               Allergies  Allergen Reactions   Celebrex [Celecoxib] Anaphylaxis and Rash   Hydrocodone Rash and Other (See Comments)    "blisters developed on arms"   Sulfa Antibiotics Rash    Consultations: Neurology  Procedures/Studies: CT HEAD WO CONTRAST  Result Date: 11/14/2020 CLINICAL DATA:  Acute neuro deficit, possible stroke EXAM: CT HEAD WITHOUT CONTRAST TECHNIQUE: Contiguous axial images were obtained from the base of the skull through the vertex without intravenous contrast. COMPARISON:  06/11/2020 FINDINGS: Brain: No evidence of acute infarction, hemorrhage, hydrocephalus, extra-axial collection or mass lesion/mass effect. Vascular: Atherosclerotic and physiologic intracranial calcifications. Skull: Negative Sinuses/Orbits: Negative Other: None IMPRESSION: No acute findings Electronically Signed   By: Lucrezia Europe M.D.   On: 11/14/2020 15:05   MR BRAIN WO CONTRAST  Result Date: 11/14/2020 CLINICAL DATA:  Stroke suspected. New onset of left upper and lower extremity weakness. EXAM: MRI HEAD WITHOUT CONTRAST TECHNIQUE: Multiplanar, multiecho pulse sequences of the brain and surrounding structures were obtained without intravenous contrast. COMPARISON:  CT head without contrast 11/14/2020. MR head without contrast 06/11/2020. FINDINGS: Brain: No acute  infarct, hemorrhage, or mass lesion is present. No significant white matter lesions are present. The ventricles are of normal size. No significant extraaxial fluid collection is present. A remote lacunar infarct in the inferior right cerebellum is stable. No new lesions are present. Brainstem and cerebellum are otherwise within normal limits. The internal auditory canals are within normal limits. Vascular: Flow is present in the major intracranial arteries. Skull and upper cervical spine: The craniocervical junction is normal. Upper cervical spine is within normal limits. Marrow signal is unremarkable. Sinuses/Orbits: The paranasal sinuses and mastoid air cells are clear. The globes and orbits are within normal limits. IMPRESSION: 1. No acute intracranial abnormality  or significant interval change. 2. Stable remote lacunar infarct of the inferior right cerebellum. Electronically Signed   By: San Morelle M.D.   On: 11/14/2020 16:33   EEG adult  Result Date: 11/15/2020 Lora Havens, MD     11/15/2020 10:25 AM Patient Name: Vincent [name] MRN: [id number] Epilepsy Attending: Lora Havens Referring Physician/Provider: Dr Fuller Plan Date: 11/15/2020 Duration: 23.09 mins Patient history: 55yo M presented with complaints of left-sided numbness and weakness. CT scan and subsequent MRI of the brain showed no acute abnormalities. EEG to evaluate for seizure. Level of alertness: Awake AEDs during EEG study: LTG, PGB Technical aspects: This EEG study was done with scalp electrodes positioned according to the 10-20 International system of electrode placement. Electrical activity was acquired at a sampling rate of '500Hz'$  and reviewed with a high frequency filter of '70Hz'$  and a low frequency filter of '1Hz'$ . EEG data were recorded continuously and digitally stored. Description: The posterior dominant rhythm consists of 8.5 Hz activity of moderate voltage (25-35 uV) seen predominantly in posterior head regions,  symmetric and reactive to eye opening and eye closing. Hyperventilation and photic stimulation were not performed.   IMPRESSION: This study is within normal limits. No seizures or epileptiform discharges were seen throughout the recording. Lora Havens   ECHOCARDIOGRAM COMPLETE  Result Date: 11/15/2020    ECHOCARDIOGRAM REPORT   Patient Name:   Vincent [name] [facility] Date of Exam: 11/15/2020 Medical Rec #:  [id number]       Height:       72.0 in Accession #:    [id number]      Weight:       224.0 lb Date of Birth:  1965/08/14        BSA:          2.236 m Patient Age:    55 years        BP:           150/70 mmHg Patient Gender: M               HR:           69 bpm. Exam Location:  Inpatient Procedure: 2D Echo, Cardiac Doppler and Color Doppler Indications:    Chest pain  History:        Patient has prior history of Echocardiogram examinations, most                 recent 06/11/2020. CAD, COPD; Risk Factors:Diabetes, Hypertension,                 Dyslipidemia and Current Smoker.  Sonographer:    Clayton Lefort RDCS (AE) Referring Phys: [id number] RONDELL A SMITH IMPRESSIONS  1. Left ventricular ejection fraction, by estimation, is 60 to 65%. The left ventricle has normal function. The left ventricle has no regional wall motion abnormalities. There is moderate left ventricular hypertrophy. Left ventricular diastolic parameters were normal.  2. Right ventricular systolic function is normal. The right ventricular size is normal.  3. The mitral valve is abnormal. Trivial mitral valve regurgitation. No evidence of mitral stenosis.  4. The aortic valve is tricuspid. There is mild calcification of the aortic valve. Aortic valve regurgitation is not visualized. Mild aortic valve sclerosis is present, with no evidence of aortic valve stenosis.  5. The inferior vena cava is normal in size with greater than 50% respiratory variability, suggesting right atrial pressure of 3 mmHg. FINDINGS  Left Ventricle: Left ventricular ejection fraction,  by estimation,  is 60 to 65%. The left ventricle has normal function. The left ventricle has no regional wall motion abnormalities. The left ventricular internal cavity size was normal in size. There is  moderate left ventricular hypertrophy. Left ventricular diastolic parameters were normal. Right Ventricle: The right ventricular size is normal. No increase in right ventricular wall thickness. Right ventricular systolic function is normal. Left Atrium: Left atrial size was normal in size. Right Atrium: Right atrial size was normal in size. Pericardium: There is no evidence of pericardial effusion. Mitral Valve: The mitral valve is abnormal. There is mild thickening of the mitral valve leaflet(s). There is mild calcification of the mitral valve leaflet(s). Trivial mitral valve regurgitation. No evidence of mitral valve stenosis. MV peak gradient, 3.2 mmHg. The mean mitral valve gradient is 2.0 mmHg. Tricuspid Valve: The tricuspid valve is normal in structure. Tricuspid valve regurgitation is mild . No evidence of tricuspid stenosis. Aortic Valve: The aortic valve is tricuspid. There is mild calcification of the aortic valve. Aortic valve regurgitation is not visualized. Mild aortic valve sclerosis is present, with no evidence of aortic valve stenosis. Aortic valve mean gradient measures 6.0 mmHg. Aortic valve peak gradient measures 10.4 mmHg. Aortic valve area, by VTI measures 3.13 cm. Pulmonic Valve: The pulmonic valve was normal in structure. Pulmonic valve regurgitation is not visualized. No evidence of pulmonic stenosis. Aorta: The aortic root is normal in size and structure. Venous: The inferior vena cava is normal in size with greater than 50% respiratory variability, suggesting right atrial pressure of 3 mmHg. IAS/Shunts: The interatrial septum was not well visualized.  LEFT VENTRICLE PLAX 2D LVIDd:         4.70 cm  Diastology LVIDs:         2.60 cm  LV e' medial:    11.40 cm/s LV PW:         1.40 cm  LV E/e'  medial:  8.2 LV IVS:        1.60 cm  LV e' lateral:   13.40 cm/s LVOT diam:     2.40 cm  LV E/e' lateral: 7.0 LV SV:         101 LV SV Index:   45 LVOT Area:     4.52 cm  RIGHT VENTRICLE             IVC RV Basal diam:  2.30 cm     IVC diam: 1.70 cm RV S prime:     15.90 cm/s TAPSE (M-mode): 2.1 cm LEFT ATRIUM             Index       RIGHT ATRIUM           Index LA diam:        3.80 cm 1.70 cm/m  RA Area:     12.50 cm LA Vol (A2C):   50.6 ml 22.63 ml/m RA Volume:   26.10 ml  11.67 ml/m LA Vol (A4C):   41.5 ml 18.56 ml/m LA Biplane Vol: 45.9 ml 20.53 ml/m  AORTIC VALVE AV Area (Vmax):    3.18 cm AV Area (Vmean):   2.99 cm AV Area (VTI):     3.13 cm AV Vmax:           161.00 cm/s AV Vmean:          111.000 cm/s AV VTI:            0.324 m AV Peak Grad:      10.4 mmHg AV  Mean Grad:      6.0 mmHg LVOT Vmax:         113.00 cm/s LVOT Vmean:        73.400 cm/s LVOT VTI:          0.224 m LVOT/AV VTI ratio: 0.69  AORTA Ao Root diam: 3.80 cm MITRAL VALVE MV Area (PHT): 2.43 cm    SHUNTS MV Area VTI:   4.03 cm    Systemic VTI:  0.22 m MV Peak grad:  3.2 mmHg    Systemic Diam: 2.40 cm MV Mean grad:  2.0 mmHg MV Vmax:       0.90 m/s MV Vmean:      58.2 cm/s MV Decel Time: 312 msec MV E velocity: 93.80 cm/s MV A velocity: 90.10 cm/s MV E/A ratio:  1.04 Jenkins Rouge MD Electronically signed by Jenkins Rouge MD Signature Date/Time: 11/15/2020/10:41:42 AM    Final      Subjective: No new complaints. Numbness/clumsiness in left arm/hand is improving.   Discharge Exam: Vitals:   11/15/20 2335 11/16/20 0423  BP: 124/61 119/64  Pulse: 64 66  Resp: 19 17  Temp: 98.2 F (36.8 C) 98.4 F (36.9 C)  SpO2: 98% 96%   General: Pt is alert, awake, not in acute distress Cardiovascular: RRR, S1/S2 +, no rubs, no gallops Respiratory: CTA bilaterally, no wheezing, no rhonchi Abdominal: Soft, NT, ND, bowel sounds + Extremities: No edema, no cyanosis Neuro: Improved numbness and much improved dexterity/strength in  LUE.  Labs: BNP (last 3 results) No results for input(s): BNP in the last 8760 hours. Basic Metabolic Panel: Recent Labs  Lab 11/14/20 1357 11/15/20 0248  NA 130* 134*  K 5.2* 4.0  CL 96* 103  CO2 26 25  GLUCOSE 542* 320*  BUN 28* 24*  CREATININE 1.86* 1.73*  CALCIUM 9.2 8.8*   Liver Function Tests: Recent Labs  Lab 11/14/20 1357  AST 13*  ALT 16  ALKPHOS 67  BILITOT 0.3  PROT 6.5  ALBUMIN 3.7   No results for input(s): LIPASE, AMYLASE in the last 168 hours. No results for input(s): AMMONIA in the last 168 hours. CBC: Recent Labs  Lab 11/14/20 1357 11/15/20 0248  WBC 7.3 5.4  NEUTROABS 3.8 2.1  HGB 12.3* 11.4*  HCT 36.4* 33.7*  MCV 83.9 83.0  PLT 205 181   Cardiac Enzymes: No results for input(s): CKTOTAL, CKMB, CKMBINDEX, TROPONINI in the last 168 hours. BNP: Invalid input(s): POCBNP CBG: Recent Labs  Lab 11/15/20 0611 11/15/20 1105 11/15/20 1558 11/15/20 2111 11/16/20 0613  GLUCAP 360* 384* 256* 201* 75   D-Dimer No results for input(s): DDIMER in the last 72 hours. Hgb A1c Recent Labs    11/14/20 1841  HGBA1C 11.8*   Lipid Profile Recent Labs    11/16/20 0242  CHOL 240*  HDL 38*  LDLCALC 137*  TRIG 325*  CHOLHDL 6.3   Thyroid function studies Recent Labs    11/14/20 1841  TSH 1.831   Anemia work up No results for input(s): VITAMINB12, FOLATE, FERRITIN, TIBC, IRON, RETICCTPCT in the last 72 hours. Urinalysis    Component Value Date/Time   COLORURINE STRAW (A) 01/07/2020 1756   APPEARANCEUR CLEAR 01/07/2020 1756   LABSPEC 1.029 01/07/2020 1756   PHURINE 6.0 01/07/2020 1756   GLUCOSEU >=500 (A) 01/07/2020 1756   HGBUR NEGATIVE 01/07/2020 1756   BILIRUBINUR NEGATIVE 01/07/2020 [place] 01/07/2020 1756   PROTEINUR NEGATIVE 01/07/2020 1756   UROBILINOGEN 1.0 03/04/2014 2312  NITRITE NEGATIVE 01/07/2020 [place] 01/07/2020 1756    Microbiology No results found for this or any  previous visit (from the past 240 hour(s)).  Time coordinating discharge: Approximately 40 minutes  Patrecia Pour, MD  [facility] 11/16/2020, 9:51 AM

## 2020-11-16 NOTE — Progress Notes (Signed)
Occupational Therapy Treatment Patient Details Name: Vincent [name] MRN: [id number] DOB: Sep 27, 1965 Today's Date: 11/16/2020    History of present illness 55 y.o. male presenting to ED 9/4 with chest pain, SOB, L-sided numbness RUE>RLE  and decreased coordination. CT and MRI (-) for acute findings noting stable lacunar infarct of R inferior cerebellum. Patient admitted under observation for AKI and suspicion of TIA. Of note patient with blood glucose of 524 in ED. PMHx significant for similar episode 05/2020 given tPA (subsequent imaging (-) for acute findings), anxiety, tobacco use (2pk daily), benign essential tremor, bipolar 2 disorder, bladder cancer, COPD, CAD, Hx of seizures, poorly controlled DMII with neuropathy, RA, chronic nonhealing full tickness wound on R hand between thumb and index finger.   OT comments  Patient reporting increased use of dominant and affected LUE this date. AROM and MMT retested noting improved strength to 4-/5 grossly. [facility] also improved compared to initial evaluation yesterday. Sensation and pain remain diminished distal to the shoulder. Education provided on safety with ADLs and LUE NMR including [place] and theraputty. Patient able to return demo each exercise with minimal cueing for accuracy. OT will continue to follow acutely.    Follow Up Recommendations  Outpatient OT    Equipment Recommendations  None recommended by OT    Recommendations for Other Services      Precautions / Restrictions Precautions Precautions: Fall       Mobility Bed Mobility Overal bed mobility: Independent                  Transfers Overall transfer level: Modified independent Equipment used: None                  Balance Overall balance assessment: No apparent balance deficits (not formally assessed)                                         ADL either performed or assessed with clinical judgement   ADL Overall ADL's : Needs  assistance/impaired                                             Vision       Perception     Praxis      Cognition Arousal/Alertness: Awake/alert Behavior During Therapy: WFL for tasks assessed/performed Overall Cognitive Status: Within Functional Limits for tasks assessed                                 General Comments: Decreased insight.        Exercises     Shoulder Instructions       General Comments      Pertinent Vitals/ Pain       Pain Assessment: No/denies pain  Home Living                                          Prior Functioning/Environment              Frequency  Min 2X/week        Progress Toward Goals  OT Goals(current goals can now be found  in the care plan section)     Acute Rehab OT Goals Patient Stated Goal: To regain strength in LUE. OT Goal Formulation: With patient Time For Goal Achievement: 11/29/20 Potential to Achieve Goals: Good ADL Goals Additional ADL Goal #1: Patient will complete ADLs with Mod I and good recall of compensatory techniques in prep for safe return home. Additional ADL Goal #2: Patient will compelete LUE NMR HEP with use of written handout and I.  Plan Discharge plan remains appropriate;Frequency remains appropriate    Co-evaluation                 AM-PAC OT "6 Clicks" Daily Activity     Outcome Measure   Help from another person eating meals?: None Help from another person taking care of personal grooming?: None Help from another person toileting, which includes using toliet, bedpan, or urinal?: None Help from another person bathing (including washing, rinsing, drying)?: A Little Help from another person to put on and taking off regular upper body clothing?: None Help from another person to put on and taking off regular lower body clothing?: A Little 6 Click Score: 22    End of Session    OT Visit Diagnosis: Muscle weakness (generalized)  (M62.81)   Activity Tolerance Patient tolerated treatment well   Patient Left in bed;with call bell/phone within reach;with bed alarm set;Other (comment) (Seated EOB)   Nurse Communication Mobility status        Time: 0750-0800 OT Time Calculation (min): 10 min  Charges: OT General Charges $OT Visit: 1 Visit OT Treatments $Neuromuscular Re-education: 8-22 mins  Alois Mincer H. OTR/L Supplemental OT, Department of rehab services [phone]   Brand Siever R H 11/16/2020, 8:05 AM

## 2020-11-16 NOTE — Plan of Care (Addendum)
Pt was discharged before seen today.   Per chart review, 55 year old male with history of CAD, hypertension, hyperlipidemia, RA, diabetes, TIA, stroke, COPD, smoker, bipolar disorder, bladder cancer, seizure on Lamictal admitted for left-sided numbness and weakness.  In 10/2018 he was admitted for left-sided weakness and numbness MRI no acute infarct.  CTA head and neck right ICA 50% stenosis.  EF 55 to 60%, A1c 11.4 UDS negative.  TG 816, LDL 142.  He was discharged on DAPT and Lipitor 40 and fenofibrate 160.  In 05/2020 he was admitted for right-sided numbness weakness, right facial droop, slurred speech.  Glucose more than 600, status post tPA.  MRI negative for acute stroke.  CT head and neck right ICA 50% stenosis with diffuse atherosclerosis intracranially.  A1c 11.4.  On this admission glucose 542, MRI again negative for acute infarct.  EF 60 to 65%, LDL 137, A1c 11.8, TG 325.  Creatinine 1.73.  Patient has been admitted several times with focal neurologic deficit and hyperglycemia > 500.  MRI x3 negative for infarct.  CTA head and neck stable with right ICA 50% stenosis over time.  Etiology per patient symptoms concerning for TIA given risk factors but also concerning for focal deficit due to hyperglycemia.  Recommend to continue aspirin 81 Plavix 75 DAPT for 3 weeks and then Plavix alone.  Continue fenofibrate 160, and increase Crestor from 10-20.  Need to quit smoking and close PCP follow-up for better DM control.  We will set up stroke follow-up at [place] in 4 weeks.  Rosalin Hawking, MD PhD Stroke Neurology 11/16/2020 3:39 PM

## 2020-11-18 ENCOUNTER — Telehealth (HOSPITAL_COMMUNITY): Payer: No Typology Code available for payment source | Admitting: Psychiatry

## 2020-11-18 ENCOUNTER — Other Ambulatory Visit: Payer: Self-pay

## 2020-12-12 ENCOUNTER — Other Ambulatory Visit: Payer: Self-pay | Admitting: Neurology

## 2020-12-12 ENCOUNTER — Other Ambulatory Visit (HOSPITAL_COMMUNITY): Payer: Self-pay | Admitting: Psychiatry

## 2020-12-12 DIAGNOSIS — F4312 Post-traumatic stress disorder, chronic: Secondary | ICD-10-CM

## 2020-12-22 ENCOUNTER — Other Ambulatory Visit (HOSPITAL_COMMUNITY): Payer: Self-pay | Admitting: Psychiatry

## 2021-01-14 ENCOUNTER — Ambulatory Visit
Admission: EM | Admit: 2021-01-14 | Discharge: 2021-01-14 | Disposition: A | Discharge status: Home / Self Care | Payer: No Typology Code available for payment source

## 2021-01-14 ENCOUNTER — Other Ambulatory Visit: Payer: Self-pay

## 2021-01-14 DIAGNOSIS — L0291 Cutaneous abscess, unspecified: Secondary | ICD-10-CM

## 2021-01-14 MED ORDER — AMOXICILLIN-POT CLAVULANATE 875-125 MG PO TABS: 1.0000 | ORAL_TABLET | Freq: Two times a day (BID) | ORAL | 0 refills | Status: DC

## 2021-01-14 NOTE — ED Provider Notes (Signed)
[facility]    CSN: [id number] Arrival date & time: 01/14/21  0919      History   Chief Complaint Chief Complaint  Patient presents with   Laceration    Right buttock    HPI Vincent [name] is a 55 y.o. male.   Patient here today for evaluation of not area to his buttocks after he fell on a limb and sustained a very small laceration about a week ago.  He reports that area has become more painful.  He did clean the area with peroxide when it first happened but states it does not seem to be improving.  He states he has been taking doxycycline for a week without improvement.  He has not had any fever or chills.  The history is provided by the patient.  Laceration Associated symptoms: no fever    Past Medical History:  Diagnosis Date   Anxiety    Benign essential tremor    Bipolar 2 disorder [facility])    followed by [facility]--- dr s. Adele Schilder   Bladder cancer [facility])    recurrent   CAD (coronary artery disease)    cardiac cath 2003  and 2011 both showed normal coronary arteries w/ preserved lvf;  Non obstructive on CTA Oct 2019.    Chronic pain syndrome    back---- [facility] [place]   Cold extremities    BLE   COPD (chronic obstructive pulmonary disease) ([place])    DDD (degenerative disc disease), lumbar    Diabetic peripheral neuropathy ([place])    Gastroparesis    followed by dr Henrene Pastor   GERD (gastroesophageal reflux disease)    Hiatal hernia    History of bladder cancer urologist-  previously dr Consuella Lose;  now dr gay   papillay TCC (Ta G1)  s/p TURBT and chemo instillation 2014   History of chest pain 12/2017   heart cath normal   History of encephalopathy 05/27/2015   admission w/ acute encephalopathy thought to be secondary to pain meds and COPD   History of gastric ulcer    History of Helicobacter pylori infection    History of kidney stones    History of TIA (transient ischemic attack) 2008  and 10-19-2018    no residual's   History of  traumatic head injury 2010   w/ LOC  per pt needed stitches, hit in head with a mower blade   Hyperlipidemia    Hypertension    Hypogonadism male    s/p  bilateral orchiectomy   Hypothyroidism    Insomnia    Mild obstructive sleep apnea    study in epic 12-04-2016, no cpap   PTSD (post-traumatic stress disorder)    chronic   PTSD (post-traumatic stress disorder)    RA (rheumatoid arthritis) ([place])    followed by [facility]   Seizures, transient [facility]) neurologist-  dr Krista Blue--  differential dx complex partial seizure .vs.  mood disorder .vs.  pseudoseizure--  negative EEG's   confusion episodes and staring spells since 11/ 2015   (03-26-2020 per pt wife last seizure 10 /2021)   Transient confusion NEUOROLOGIST-  DR Krista Blue   Episodes since 11/ 2015--  neurologist dx  differential complex partial seizure  .vs. mood disorder . vs. pseudoseizure   Type 2 diabetes mellitus treated with insulin [facility])    endocrinologist--- dr Loanne Drilling---  (03-26-2020 pt does not check blood sugar at home)    Patient Active Problem List   Diagnosis Date Noted  Cerebral thrombosis with cerebral infarction 11/16/2020   Stroke-like symptoms 11/14/2020   Hyperkalemia 11/14/2020   AKI (acute kidney injury) ([place]) 11/14/2020   Ischemic stroke ([place]) 06/10/2020   Hyperglycemia    Gangrene of finger of left hand (HCC)    Pain due to onychomycosis of toenails of both feet 01/28/2020   Hyperglycemia due to type 2 diabetes mellitus ([place]) 01/07/2020   Resistance to insulin 01/07/2020   Right leg weakness 04/24/2019   Right leg pain 04/24/2019   Gait abnormality 04/24/2019   TIA (transient ischemic attack) 10/20/2018   Uncontrolled type 2 DM with hyperosmolar nonketotic hyperglycemia ([place]) 10/19/2018   Left sided numbness 10/19/2018   Diabetic autonomic neuropathy associated with type 2 diabetes mellitus ([place]) 02/01/2018   DM type 2 with diabetic peripheral neuropathy ([place]) 02/01/2018   Dyslipidemia  01/31/2018   Coronary artery disease involving native coronary artery of native heart without angina pectoris 01/31/2018   Chest pain 01/01/2018   Seizure disorder ([place]) 12/19/2016   Essential tremor 12/19/2016   Polypharmacy 12/19/2016   Chronic post-traumatic stress disorder (PTSD) 06/16/2016   Tremor 06/16/2016   Tobacco use disorder 05/05/2016   Psychophysiological insomnia 06/02/2015   Altered mental status 05/28/2015   Acute bronchitis 05/28/2015   Type 2 diabetes mellitus with hyperglycemia ([place]) 05/28/2015   Hypothyroidism 05/28/2015   Bipolar 2 disorder ([place]) 05/28/2015   History of rheumatoid arthritis 05/28/2015   Chronic pain 05/28/2015   Depression 03/18/2015   Carpal tunnel syndrome on left 02/10/2015   Carpal tunnel syndrome on right 02/10/2015   Diabetes ([place]) 01/23/2015   Obesity (BMI 30-39.9) 03/06/2014   Acute encephalopathy 03/05/2014   HLD (hyperlipidemia) 03/05/2014   Anemia, normocytic normochromic 03/05/2014   Altered mental state    Helicobacter pylori (H. pylori) infection 11/20/2012   Protein-calorie malnutrition, severe ([place]) 10/09/2012   Dysphagia 08/19/2012   Gastroparesis 08/19/2012   Bladder tumor 08/08/2012   Biliary dyskinesia 11/02/2010    Past Surgical History:  Procedure Laterality Date   AMPUTATION Left 04/28/2020   Procedure: LEFT LITTLE FINGER AMPUTATION;  Surgeon: Newt Minion, MD;  Location: [facility];  Service: Orthopedics;  Laterality: Left;   CARDIAC CATHETERIZATION  12-27-2001  DR Einar Gip  &  05-26-2009  DR Irish Lack   RESULTS FOR BOTH ARE NORMAL CORONARIES AND PERSERVED LVF/ EF 60%   CARPAL TUNNEL RELEASE Bilateral right 09-16-2003;  left ?   CARPAL TUNNEL RELEASE Left 02/25/2015   Procedure: LEFT CARPAL TUNNEL RELEASE;  Surgeon: Leanora Cover, MD;  Location: [facility];  Service: Orthopedics;  Laterality: Left;   CYSTOSCOPY N/A 10/10/2012   Procedure: CYSTOSCOPY CLOT EVACUATION FULGERATION OF BLEEDERS ;  Surgeon: Claybon Jabs, MD;  Location[facility];  Service: Urology;  Laterality: N/A;   CYSTOSCOPY WITH BIOPSY N/A 11/26/2015   Procedure: CYSTOSCOPY WITH BIOPSY AND FULGURATION;  Surgeon: Kathie Rhodes, MD;  Location: [facility];  Service: Urology;  Laterality: N/A;   ESOPHAGOGASTRODUODENOSCOPY  2014   LAPAROSCOPIC CHOLECYSTECTOMY  11-17-2010   ORCHIECTOMY Right 02/21/2016   Procedure: SCROTAL ORCHIECTOMY with TESTICULAR PROSTHESIS IMPLANT;  Surgeon: Kathie Rhodes, MD;  Location: [facility];  Service: Urology;  Laterality: Right;   ORCHIECTOMY Left 09/02/2018   Procedure: ORCHIECTOMY;  Surgeon: Kathie Rhodes, MD;  Location[facility];  Service: Urology;  Laterality: Left;   ROTATOR CUFF REPAIR Right 12/2004   TRANSURETHRAL RESECTION OF BLADDER TUMOR N/A 08/09/2012   Procedure: TRANSURETHRAL RESECTION OF BLADDER TUMOR (TURBT)  WITH GYRUS WITH MITOMYCIN C;  Surgeon: Claybon Jabs, MD;  Location[facility];  Service: Urology;  Laterality: N/A;   TRANSURETHRAL RESECTION OF BLADDER TUMOR N/A 03/29/2020   Procedure: TRANSURETHRAL RESECTION OF BLADDER TUMOR (TURBT) and post-op instillation of gemcitabine;  Surgeon: Janith Lima, MD;  Location: [facility];  Service: Urology;  Laterality: N/A;   TRANSURETHRAL RESECTION OF BLADDER TUMOR WITH GYRUS (TURBT-GYRUS) N/A 02/27/2014   Procedure: TRANSURETHRAL RESECTION OF BLADDER TUMOR WITH GYRUS (TURBT-GYRUS);  Surgeon: Claybon Jabs, MD;  Location[facility];  Service: Urology;  Laterality: N/A;       Home Medications    Prior to Admission medications   Medication Sig Start Date End Date Taking? Authorizing Provider  amoxicillin-clavulanate (AUGMENTIN) 875-125 MG tablet Take 1 tablet by mouth every 12 (twelve) hours. 01/14/21  Yes Francene Finders, PA-C  aspirin EC 81 MG EC tablet Take 1 tablet (81 mg total) by mouth daily. Swallow whole. 11/16/20   Patrecia Pour, MD  busPIRone (BUSPAR) 5 MG tablet Take by mouth.    [provider]  clopidogrel (PLAVIX) 75 MG tablet Take 1 tablet (75 mg total) by mouth daily. 07/28/20   Marcial Pacas, MD  Continuous Blood Gluc Sensor (FREESTYLE LIBRE 2 SENSOR) MISC 1 Device by Does not apply route every 14 (fourteen) days. Patient taking differently: 1 Device by Does not apply route every 14 (fourteen) days. Has not received- waiting on Insurance 02/25/20   Renato Shin, MD  Dulaglutide (TRULICITY) 4.5 YQ/6.5HQ SOPN Inject 4.5 mg into the skin once a week. Patient taking differently: Inject 4.5 mg into the skin every Thursday. 01/22/20   Renato Shin, MD  DULoxetine (CYMBALTA) 60 MG capsule Take 1 capsule (60 mg total) by mouth daily. 07/28/20   Marcial Pacas, MD  fenofibrate 160 MG tablet Take 160 mg by mouth daily. 12/31/19   [provider]  insulin glargine, 2 Unit Dial, (TOUJEO MAX SOLOSTAR) 300 UNIT/ML Solostar Pen Inject 180 Units into the skin every morning. And pen needles 1/day Patient taking differently: Inject 180 Units into the skin in the morning. 02/25/20   Renato Shin, MD  isosorbide mononitrate (IMDUR) 120 MG 24 hr tablet Take 1 tablet (120 mg total) by mouth daily. Please make annual appt in April with Dr. Percival Spanish for refills. Thank you Patient taking differently: Take 120 mg by mouth daily. 05/05/19   Minus Breeding, MD  lamoTRIgine (LAMICTAL) 150 MG tablet Take 1 tablet (150 mg total) by mouth 2 (two) times daily. 07/28/20   Marcial Pacas, MD  levothyroxine (SYNTHROID, LEVOTHROID) 25 MCG tablet Take 25 mcg by mouth daily before breakfast.     [provider]  lurasidone (LATUDA) 40 MG TABS tablet Take 1 tablet (40 mg total) by mouth daily with breakfast. 08/25/20   Arfeen, Arlyce Harman, MD  metFORMIN (GLUCOPHAGE-XR) 500 MG 24 hr tablet Take 500 mg by mouth 2 (two) times daily. 08/24/18   [provider]  metoCLOPramide (REGLAN) 10 MG tablet Take by mouth 3 (three) times daily  with meals.    [provider]  morphine (MS CONTIN) 60 MG 12 hr tablet Take 60 mg by mouth 2 (two) times daily. Every 12 hours 06/12/19   [provider]  morphine (MSIR) 15 MG tablet Take 15 mg by mouth 2 (two) times daily as needed. For breakthrough pain  (03-26-2020 per pt wife pt takes this twice also 06/11/19   [provider]  omeprazole (PRILOSEC) 40 MG capsule Take 40 mg by mouth daily.    [provider]  Pancrelipase, Lip-Prot-Amyl, (ZENPEP) 5000-24000 units CPEP Take 1-2 capsules by mouth See admin instructions. Takes 1 capsule in the morning and 2 capsules at night    [provider]  pregabalin (LYRICA) 225 MG capsule Take one cap midday, one cap in evening. Patient taking differently: Take 225-550 mg by mouth 2 (two) times daily. 1 capsule in the morning and 2 capsules at bedtime 07/28/20   Marcial Pacas, MD  pregabalin (LYRICA) 225 MG capsule Take 1 capsule (225 mg total) by mouth every morning. 07/28/20   Marcial Pacas, MD  rosuvastatin (CRESTOR) 10 MG tablet Take 1 tablet (10 mg total) by mouth daily. 11/16/20   Patrecia Pour, MD  sucralfate (CARAFATE) 1 g tablet Take 1 g by mouth 2 (two) times daily. 01/26/20   [provider]  tamsulosin (FLOMAX) 0.4 MG CAPS capsule Take 0.8 mg by mouth at bedtime.    [provider]  traZODone (DESYREL) 150 MG tablet Take 1 tablet (150 mg total) by mouth at bedtime. 11/08/20   Arfeen, Arlyce Harman, MD  venlafaxine XR (EFFEXOR-XR) 150 MG 24 hr capsule TAKE 1 CAPSULE BY MOUTH ONCE DAILY WITH BREAKFAST 12/13/20   Marcial Pacas, MD  Vitamin D, Ergocalciferol, (DRISDOL) 1.25 MG (50000 UNIT) CAPS capsule Take 50,000 Units by mouth every Wednesday. 12/26/19   [provider]    Family History Family History  Problem Relation Age of Onset   Diabetes Mother    Diabetes Father    Hypertension Father    Heart attack Father 11       died age 65   Alcohol abuse Father    Colon cancer Neg Hx     Esophageal cancer Neg Hx    Stomach cancer Neg Hx    Rectal cancer Neg Hx     Social History Social History   Tobacco Use   Smoking status: Every Day    Packs/day: 1.50    Years: 38.00    Pack years: 57.00    Types: Cigarettes   Smokeless tobacco: Never  Vaping Use   Vaping Use: Never used  Substance Use Topics   Alcohol use: No   Drug use: No     Allergies   Celebrex [celecoxib], Hydrocodone, and Sulfa antibiotics   Review of Systems Review of Systems  Constitutional:  Negative for chills and fever.  Eyes:  Negative for discharge and redness.  Respiratory:  Negative for shortness of breath.   Gastrointestinal:  Negative for nausea and vomiting.  Skin:  Positive for color change and wound.  Neurological:  Negative for numbness.    Physical Exam Triage Vital Signs ED Triage Vitals  Enc Vitals Group     BP 01/14/21 1048 (!) 119/59     Pulse Rate 01/14/21 1048 82     Resp 01/14/21 1048 18     Temp 01/14/21 1048 98.3 F (36.8 C)     Temp Source 01/14/21 1048 Oral     SpO2 01/14/21 1048 95 %     Weight --      Height --      Head Circumference --      Peak Flow --      Pain Score 01/14/21 1052 8     Pain Loc --      Pain Edu? --      Excl. in [place]? --    No data found.  Updated Vital Signs BP (!) 119/59 (BP Location: Left Arm)   Pulse 82   Temp 98.3 F (36.8 C) (Oral)   Resp 18   SpO2 95%   Physical Exam Vitals and nursing note reviewed.  Constitutional:      General: He is not in acute distress.    Appearance: Normal appearance. He is not ill-appearing.  HENT:     Head: Normocephalic and atraumatic.  Eyes:     Conjunctiva/sclera: Conjunctivae normal.  Cardiovascular:     Rate and Rhythm: Normal rate.  Pulmonary:     Effort: Pulmonary effort is normal.  Skin:    Comments: Grape sized abscess noted to right buttocks at gluteal cleft with central wound  Neurological:     Mental Status: He is alert.  Psychiatric:        Mood and Affect: Mood  normal.        Behavior: Behavior normal.        Thought Content: Thought content normal.     UC Treatments / Results  Labs (all labs ordered are listed, but only abnormal results are displayed) Labs Reviewed - No data to display  EKG   Radiology No results found.  Procedures Procedures (including critical care time)  Medications Ordered in UC Medications - No data to display  Initial Impression / Assessment and Plan / UC Course  I have reviewed the triage vital signs and the nursing notes.  Pertinent labs & imaging results that were available during my care of the patient were reviewed by me and considered in my medical decision making (see chart for details).   Augmentin prescribed given patient reports he has been taking doxycycline. I & D not indicated given central wound and spontaneous drainage. Encouraged warm compresses to help promote further drainage. Recommended follow up if no improvement or if symptoms worsen in any way.   Final Clinical Impressions(s) / UC Diagnoses   Final diagnoses:  Abscess   Discharge Instructions   None    ED Prescriptions     Medication Sig Dispense Auth. Provider   amoxicillin-clavulanate (AUGMENTIN) 875-125 MG tablet Take 1 tablet by mouth every 12 (twelve) hours. 14 tablet Francene Finders, PA-C      PDMP not reviewed this encounter.   Francene Finders, PA-C 01/14/21 1213

## 2021-01-14 NOTE — ED Triage Notes (Signed)
1.5 wk ago, Pt fell on a piece of tree limb on his yard, causing a laceration on his right buttock. Pt's wife used saline and peroxide to clean the area. He has also been otc antibiotic ointment w/o relief. Pt describes the area as "egg sized knot" that is getting worse. Wearing jeans irritates area. No LOC w/fall.

## 2021-01-20 ENCOUNTER — Emergency Department (HOSPITAL_BASED_OUTPATIENT_CLINIC_OR_DEPARTMENT_OTHER)
Admission: EM | Admit: 2021-01-20 | Discharge: 2021-01-20 | Disposition: A | Discharge status: Home / Self Care | Payer: No Typology Code available for payment source | Attending: Emergency Medicine | Admitting: Emergency Medicine

## 2021-01-20 ENCOUNTER — Ambulatory Visit
Admission: EM | Admit: 2021-01-20 | Discharge: 2021-01-20 | Disposition: A | Discharge status: Home / Self Care | Payer: No Typology Code available for payment source

## 2021-01-20 ENCOUNTER — Encounter (HOSPITAL_BASED_OUTPATIENT_CLINIC_OR_DEPARTMENT_OTHER): Payer: Self-pay | Admitting: Emergency Medicine

## 2021-01-20 ENCOUNTER — Other Ambulatory Visit: Payer: Self-pay

## 2021-01-20 DIAGNOSIS — F17210 Nicotine dependence, cigarettes, uncomplicated: Secondary | ICD-10-CM | POA: Insufficient documentation

## 2021-01-20 DIAGNOSIS — Z7982 Long term (current) use of aspirin: Secondary | ICD-10-CM | POA: Diagnosis not present

## 2021-01-20 DIAGNOSIS — Z8551 Personal history of malignant neoplasm of bladder: Secondary | ICD-10-CM | POA: Diagnosis not present

## 2021-01-20 DIAGNOSIS — Z794 Long term (current) use of insulin: Secondary | ICD-10-CM | POA: Diagnosis not present

## 2021-01-20 DIAGNOSIS — Z79899 Other long term (current) drug therapy: Secondary | ICD-10-CM | POA: Diagnosis not present

## 2021-01-20 DIAGNOSIS — E1143 Type 2 diabetes mellitus with diabetic autonomic (poly)neuropathy: Secondary | ICD-10-CM | POA: Insufficient documentation

## 2021-01-20 DIAGNOSIS — I2510 Atherosclerotic heart disease of native coronary artery without angina pectoris: Secondary | ICD-10-CM | POA: Insufficient documentation

## 2021-01-20 DIAGNOSIS — E1140 Type 2 diabetes mellitus with diabetic neuropathy, unspecified: Secondary | ICD-10-CM | POA: Insufficient documentation

## 2021-01-20 DIAGNOSIS — L0291 Cutaneous abscess, unspecified: Secondary | ICD-10-CM

## 2021-01-20 DIAGNOSIS — E039 Hypothyroidism, unspecified: Secondary | ICD-10-CM | POA: Diagnosis not present

## 2021-01-20 DIAGNOSIS — J449 Chronic obstructive pulmonary disease, unspecified: Secondary | ICD-10-CM | POA: Insufficient documentation

## 2021-01-20 DIAGNOSIS — L0231 Cutaneous abscess of buttock: Secondary | ICD-10-CM | POA: Insufficient documentation

## 2021-01-20 DIAGNOSIS — I10 Essential (primary) hypertension: Secondary | ICD-10-CM | POA: Diagnosis not present

## 2021-01-20 DIAGNOSIS — Z7984 Long term (current) use of oral hypoglycemic drugs: Secondary | ICD-10-CM | POA: Diagnosis not present

## 2021-01-20 MED ORDER — LIDOCAINE HCL (PF) 1 % IJ SOLN
5.0000 mL | Freq: Once | INTRAMUSCULAR | Status: DC
  Filled 2021-01-20: qty 5

## 2021-01-20 NOTE — ED Provider Notes (Signed)
[facility]    CSN: [id number] Arrival date & time: 01/20/21  0808      History   Chief Complaint Chief Complaint  Patient presents with   Abscess    HPI Vincent [name] is a 55 y.o. male.   Patient presents for further evaluation of an abscess to the left buttock that started approximately 2 weeks ago.  Patient was seen on 01/14/2021 and was prescribed Augmentin antibiotic, but patient has not seen any improvement.  Patient has been taking doxycycline for approximately 1 week prior to first urgent care visit with no improvement as well.  Patient reports abscess has been draining on its own.  Patient denies any fevers.  Abscess started when patient fell down onto a limb that caused a very small laceration.  Patient reports that the pain has become severe.   Abscess  Past Medical History:  Diagnosis Date   Anxiety    Benign essential tremor    Bipolar 2 disorder [facility])    followed by [facility]--- dr s. Adele Schilder   Bladder cancer [facility])    recurrent   CAD (coronary artery disease)    cardiac cath 2003  and 2011 both showed normal coronary arteries w/ preserved lvf;  Non obstructive on CTA Oct 2019.    Chronic pain syndrome    back---- [facility] [place]   Cold extremities    BLE   COPD (chronic obstructive pulmonary disease) ([place])    DDD (degenerative disc disease), lumbar    Diabetic peripheral neuropathy ([place])    Gastroparesis    followed by dr Henrene Pastor   GERD (gastroesophageal reflux disease)    Hiatal hernia    History of bladder cancer urologist-  previously dr Consuella Lose;  now dr gay   papillay TCC (Ta G1)  s/p TURBT and chemo instillation 2014   History of chest pain 12/2017   heart cath normal   History of encephalopathy 05/27/2015   admission w/ acute encephalopathy thought to be secondary to pain meds and COPD   History of gastric ulcer    History of Helicobacter pylori infection    History of kidney stones    History of TIA (transient  ischemic attack) 2008  and 10-19-2018    no residual's   History of traumatic head injury 2010   w/ LOC  per pt needed stitches, hit in head with a mower blade   Hyperlipidemia    Hypertension    Hypogonadism male    s/p  bilateral orchiectomy   Hypothyroidism    Insomnia    Mild obstructive sleep apnea    study in epic 12-04-2016, no cpap   PTSD (post-traumatic stress disorder)    chronic   PTSD (post-traumatic stress disorder)    RA (rheumatoid arthritis) ([place])    followed by [facility]   Seizures, transient [facility]) neurologist-  dr Krista Blue--  differential dx complex partial seizure .vs.  mood disorder .vs.  pseudoseizure--  negative EEG's   confusion episodes and staring spells since 11/ 2015   (03-26-2020 per pt wife last seizure 10 /2021)   Transient confusion NEUOROLOGIST-  DR Krista Blue   Episodes since 11/ 2015--  neurologist dx  differential complex partial seizure  .vs. mood disorder . vs. pseudoseizure   Type 2 diabetes mellitus treated with insulin [facility])    endocrinologist--- dr Loanne Drilling---  (03-26-2020 pt does not check blood sugar at home)    Patient Active Problem List   Diagnosis Date Noted  Cerebral thrombosis with cerebral infarction 11/16/2020   Stroke-like symptoms 11/14/2020   Hyperkalemia 11/14/2020   AKI (acute kidney injury) ([place]) 11/14/2020   Ischemic stroke ([place]) 06/10/2020   Hyperglycemia    Gangrene of finger of left hand (HCC)    Pain due to onychomycosis of toenails of both feet 01/28/2020   Hyperglycemia due to type 2 diabetes mellitus ([place]) 01/07/2020   Resistance to insulin 01/07/2020   Right leg weakness 04/24/2019   Right leg pain 04/24/2019   Gait abnormality 04/24/2019   TIA (transient ischemic attack) 10/20/2018   Uncontrolled type 2 DM with hyperosmolar nonketotic hyperglycemia ([place]) 10/19/2018   Left sided numbness 10/19/2018   Diabetic autonomic neuropathy associated with type 2 diabetes mellitus ([place]) 02/01/2018   DM type 2 with  diabetic peripheral neuropathy ([place]) 02/01/2018   Dyslipidemia 01/31/2018   Coronary artery disease involving native coronary artery of native heart without angina pectoris 01/31/2018   Chest pain 01/01/2018   Seizure disorder ([place]) 12/19/2016   Essential tremor 12/19/2016   Polypharmacy 12/19/2016   Chronic post-traumatic stress disorder (PTSD) 06/16/2016   Tremor 06/16/2016   Tobacco use disorder 05/05/2016   Psychophysiological insomnia 06/02/2015   Altered mental status 05/28/2015   Acute bronchitis 05/28/2015   Type 2 diabetes mellitus with hyperglycemia ([place]) 05/28/2015   Hypothyroidism 05/28/2015   Bipolar 2 disorder ([place]) 05/28/2015   History of rheumatoid arthritis 05/28/2015   Chronic pain 05/28/2015   Depression 03/18/2015   Carpal tunnel syndrome on left 02/10/2015   Carpal tunnel syndrome on right 02/10/2015   Diabetes ([place]) 01/23/2015   Obesity (BMI 30-39.9) 03/06/2014   Acute encephalopathy 03/05/2014   HLD (hyperlipidemia) 03/05/2014   Anemia, normocytic normochromic 03/05/2014   Altered mental state    Helicobacter pylori (H. pylori) infection 11/20/2012   Protein-calorie malnutrition, severe ([place]) 10/09/2012   Dysphagia 08/19/2012   Gastroparesis 08/19/2012   Bladder tumor 08/08/2012   Biliary dyskinesia 11/02/2010    Past Surgical History:  Procedure Laterality Date   AMPUTATION Left 04/28/2020   Procedure: LEFT LITTLE FINGER AMPUTATION;  Surgeon: Newt Minion, MD;  Location: [place];  Service: Orthopedics;  Laterality: Left;   CARDIAC CATHETERIZATION  12-27-2001  DR Einar Gip  &  05-26-2009  DR Irish Lack   RESULTS FOR BOTH ARE NORMAL CORONARIES AND PERSERVED LVF/ EF 60%   CARPAL TUNNEL RELEASE Bilateral right 09-16-2003;  left ?   CARPAL TUNNEL RELEASE Left 02/25/2015   Procedure: LEFT CARPAL TUNNEL RELEASE;  Surgeon: Leanora Cover, MD;  Location: [place];  Service: Orthopedics;  Laterality: Left;   CYSTOSCOPY N/A 10/10/2012   Procedure:  CYSTOSCOPY CLOT EVACUATION FULGERATION OF BLEEDERS ;  Surgeon: Claybon Jabs, MD;  Location: [facility];  Service: Urology;  Laterality: N/A;   CYSTOSCOPY WITH BIOPSY N/A 11/26/2015   Procedure: CYSTOSCOPY WITH BIOPSY AND FULGURATION;  Surgeon: Kathie Rhodes, MD;  Location: [place];  Service: Urology;  Laterality: N/A;   ESOPHAGOGASTRODUODENOSCOPY  2014   LAPAROSCOPIC CHOLECYSTECTOMY  11-17-2010   ORCHIECTOMY Right 02/21/2016   Procedure: SCROTAL ORCHIECTOMY with TESTICULAR PROSTHESIS IMPLANT;  Surgeon: Kathie Rhodes, MD;  Location: [facility];  Service: Urology;  Laterality: Right;   ORCHIECTOMY Left 09/02/2018   Procedure: ORCHIECTOMY;  Surgeon: Kathie Rhodes, MD;  Location[facility];  Service: Urology;  Laterality: Left;   ROTATOR CUFF REPAIR Right 12/2004   TRANSURETHRAL RESECTION OF BLADDER TUMOR N/A 08/09/2012   Procedure: TRANSURETHRAL RESECTION OF BLADDER TUMOR (TURBT)  WITH GYRUS WITH MITOMYCIN C;  Surgeon: Claybon Jabs, MD;  Location: [facility];  Service: Urology;  Laterality: N/A;   TRANSURETHRAL RESECTION OF BLADDER TUMOR N/A 03/29/2020   Procedure: TRANSURETHRAL RESECTION OF BLADDER TUMOR (TURBT) and post-op instillation of gemcitabine;  Surgeon: Janith Lima, MD;  Location: [facility];  Service: Urology;  Laterality: N/A;   TRANSURETHRAL RESECTION OF BLADDER TUMOR WITH GYRUS (TURBT-GYRUS) N/A 02/27/2014   Procedure: TRANSURETHRAL RESECTION OF BLADDER TUMOR WITH GYRUS (TURBT-GYRUS);  Surgeon: Claybon Jabs, MD;  Location: [facility];  Service: Urology;  Laterality: N/A;       Home Medications    Prior to Admission medications   Medication Sig Start Date End Date Taking? Authorizing Provider  amoxicillin-clavulanate (AUGMENTIN) 875-125 MG tablet Take 1 tablet by mouth every 12 (twelve) hours. 01/14/21   Francene Finders, PA-C  aspirin EC 81 MG EC tablet Take 1  tablet (81 mg total) by mouth daily. Swallow whole. 11/16/20   Patrecia Pour, MD  busPIRone (BUSPAR) 5 MG tablet Take by mouth.    [provider]  clopidogrel (PLAVIX) 75 MG tablet Take 1 tablet (75 mg total) by mouth daily. 07/28/20   Marcial Pacas, MD  Continuous Blood Gluc Sensor (FREESTYLE LIBRE 2 SENSOR) MISC 1 Device by Does not apply route every 14 (fourteen) days. Patient taking differently: 1 Device by Does not apply route every 14 (fourteen) days. Has not received- waiting on Insurance 02/25/20   Renato Shin, MD  Dulaglutide (TRULICITY) 4.5 ZY/6.0YT SOPN Inject 4.5 mg into the skin once a week. Patient taking differently: Inject 4.5 mg into the skin every Thursday. 01/22/20   Renato Shin, MD  DULoxetine (CYMBALTA) 60 MG capsule Take 1 capsule (60 mg total) by mouth daily. 07/28/20   Marcial Pacas, MD  fenofibrate 160 MG tablet Take 160 mg by mouth daily. 12/31/19   [provider]  insulin glargine, 2 Unit Dial, (TOUJEO MAX SOLOSTAR) 300 UNIT/ML Solostar Pen Inject 180 Units into the skin every morning. And pen needles 1/day Patient taking differently: Inject 180 Units into the skin in the morning. 02/25/20   Renato Shin, MD  isosorbide mononitrate (IMDUR) 120 MG 24 hr tablet Take 1 tablet (120 mg total) by mouth daily. Please make annual appt in April with Dr. Percival Spanish for refills. Thank you Patient taking differently: Take 120 mg by mouth daily. 05/05/19   Minus Breeding, MD  lamoTRIgine (LAMICTAL) 150 MG tablet Take 1 tablet (150 mg total) by mouth 2 (two) times daily. 07/28/20   Marcial Pacas, MD  levothyroxine (SYNTHROID, LEVOTHROID) 25 MCG tablet Take 25 mcg by mouth daily before breakfast.     [provider]  lurasidone (LATUDA) 40 MG TABS tablet Take 1 tablet (40 mg total) by mouth daily with breakfast. 08/25/20   Arfeen, Arlyce Harman, MD  metFORMIN (GLUCOPHAGE-XR) 500 MG 24 hr tablet Take 500 mg by mouth 2 (two) times daily. 08/24/18   [provider]   metoCLOPramide (REGLAN) 10 MG tablet Take by mouth 3 (three) times daily with meals.    [provider]  morphine (MS CONTIN) 60 MG 12 hr tablet Take 60 mg by mouth 2 (two) times daily. Every 12 hours 06/12/19   [provider]  morphine (MSIR) 15 MG tablet Take 15 mg by mouth 2 (two) times daily as needed. For breakthrough pain  (03-26-2020 per pt wife pt takes this twice also 06/11/19   [provider]  omeprazole (PRILOSEC) 40 MG capsule Take 40 mg by mouth daily.    [provider]  Pancrelipase, Lip-Prot-Amyl, (ZENPEP) 5000-24000 units CPEP Take 1-2 capsules by mouth See admin instructions. Takes 1 capsule in the morning and 2 capsules at night    [provider]  pregabalin (LYRICA) 225 MG capsule Take one cap midday, one cap in evening. Patient taking differently: Take 225-550 mg by mouth 2 (two) times daily. 1 capsule in the morning and 2 capsules at bedtime 07/28/20   Marcial Pacas, MD  pregabalin (LYRICA) 225 MG capsule Take 1 capsule (225 mg total) by mouth every morning. 07/28/20   Marcial Pacas, MD  rosuvastatin (CRESTOR) 10 MG tablet Take 1 tablet (10 mg total) by mouth daily. 11/16/20   Patrecia Pour, MD  sucralfate (CARAFATE) 1 g tablet Take 1 g by mouth 2 (two) times daily. 01/26/20   [provider]  tamsulosin (FLOMAX) 0.4 MG CAPS capsule Take 0.8 mg by mouth at bedtime.    [provider]  traZODone (DESYREL) 150 MG tablet Take 1 tablet (150 mg total) by mouth at bedtime. 11/08/20   Arfeen, Arlyce Harman, MD  venlafaxine XR (EFFEXOR-XR) 150 MG 24 hr capsule TAKE 1 CAPSULE BY MOUTH ONCE DAILY WITH BREAKFAST 12/13/20   Marcial Pacas, MD  Vitamin D, Ergocalciferol, (DRISDOL) 1.25 MG (50000 UNIT) CAPS capsule Take 50,000 Units by mouth every Wednesday. 12/26/19   [provider]    Family History Family History  Problem Relation Age of Onset   Diabetes Mother    Diabetes Father    Hypertension Father    Heart attack Father 71        died age 21   Alcohol abuse Father    Colon cancer Neg Hx    Esophageal cancer Neg Hx    Stomach cancer Neg Hx    Rectal cancer Neg Hx     Social History Social History   Tobacco Use   Smoking status: Every Day    Packs/day: 1.50    Years: 38.00    Pack years: 57.00    Types: Cigarettes   Smokeless tobacco: Never  Vaping Use   Vaping Use: Never used  Substance Use Topics   Alcohol use: No   Drug use: No     Allergies   Celebrex [celecoxib], Hydrocodone, and Sulfa antibiotics   Review of Systems Review of Systems Per HPI  Physical Exam Triage Vital Signs ED Triage Vitals  Enc Vitals Group     BP 01/20/21 0818 129/69     Pulse Rate 01/20/21 0818 83     Resp 01/20/21 0818 20     Temp 01/20/21 0818 98.2 F (36.8 C)     Temp Source 01/20/21 0818 Oral     SpO2 01/20/21 0818 94 %     Weight --      Height --      Head Circumference --      Peak Flow --      Pain Score 01/20/21 0820 0     Pain Loc --      Pain Edu? --      Excl. in [place]? --    No data found.  Updated Vital Signs BP 129/69 (BP Location: Left Arm)   Pulse 83   Temp 98.2 F (36.8 C) (Oral)   Resp 20   SpO2 94%   Visual Acuity Right Eye Distance:   Left Eye Distance:   Bilateral Distance:    Right  Eye Near:   Left Eye Near:    Bilateral Near:     Physical Exam Constitutional:      General: He is not in acute distress.    Appearance: Normal appearance. He is not toxic-appearing or diaphoretic.  HENT:     Head: Normocephalic and atraumatic.  Eyes:     Extraocular Movements: Extraocular movements intact.     Conjunctiva/sclera: Conjunctivae normal.  Pulmonary:     Effort: Pulmonary effort is normal.  Skin:    Findings: Abscess present.          Comments: Approximately 10 cm x 10 cm severely indurated abscess present to left buttocks.  Has open area to center but no drainage noted at this time.  No necrosis noted.  Neurological:     General: No focal deficit present.      Mental Status: He is alert and oriented to person, place, and time. Mental status is at baseline.  Psychiatric:        Mood and Affect: Mood normal.        Behavior: Behavior normal.        Thought Content: Thought content normal.        Judgment: Judgment normal.     UC Treatments / Results  Labs (all labs ordered are listed, but only abnormal results are displayed) Labs Reviewed - No data to display  EKG   Radiology No results found.  Procedures Procedures (including critical care time)  Medications Ordered in UC Medications - No data to display  Initial Impression / Assessment and Plan / UC Course  I have reviewed the triage vital signs and the nursing notes.  Pertinent labs & imaging results that were available during my care of the patient were reviewed by me and considered in my medical decision making (see chart for details).     It appears the abscess may need more extensive I&D than can be provided at the urgent care.  Advised patient that it would be best for him to go to the emergency department for further evaluation and management.  Patient was agreeable with plan.  Agree with patient self transport to hospital due to vital signs being stable. Final Clinical Impressions(s) / UC Diagnoses   Final diagnoses:  Abscess of left buttock     Discharge Instructions      Please go to the emergency department as soon as you leave urgent care.      ED Prescriptions   None    PDMP not reviewed this encounter.   Teodora Medici, [place] 01/20/21 [phone]

## 2021-01-20 NOTE — ED Triage Notes (Signed)
From  home. Pt c/o an abscess that he has had for the past 2 -3 weeks. Pt stated that he was treated and given antibiotics for same and is inquiring about having it lanced.

## 2021-01-20 NOTE — Discharge Instructions (Signed)
Continue your previously prescribed antibiotics.  Come back to ER or see your primary doctor for wound recheck and packing removal in 2 to 3 days.  If your packing falls out before this time, leave it out and do not attempt to put it back in yourself.  If in the meantime you develop significant increase in redness or swelling, fever or other new concerning symptom, please come back to ER.

## 2021-01-20 NOTE — Discharge Instructions (Signed)
Please go to the emergency department as soon as you leave urgent care.

## 2021-01-20 NOTE — ED Provider Notes (Signed)
[place] EMERGENCY DEPT Provider Note   CSN: [id number] Arrival date & time: 01/20/21  9407     History Chief Complaint  Patient presents with   Abscess    Vincent [name] is a 55 y.o. male.  Presents to ER for abscess on his buttocks.  Abscess has been ongoing for the past 2 weeks or so.  States that he was seen by different healthcare provider a few days ago and was prescribed antibiotics.  States that he has been taking these antibiotics (Augmentin and doxycycline) and still has about 5 days left.  Pain and swelling has not improved despite these antibiotics.  He went to an urgent care this morning and they recommended he go to the ER for drainage.  No fevers.  Does have history of diabetes.  Otherwise has felt fine. HPI     Past Medical History:  Diagnosis Date   Anxiety    Benign essential tremor    Bipolar 2 disorder [facility])    followed by [facility]--- dr s. Adele Schilder   Bladder cancer [facility])    recurrent   CAD (coronary artery disease)    cardiac cath 2003  and 2011 both showed normal coronary arteries w/ preserved lvf;  Non obstructive on CTA Oct 2019.    Chronic pain syndrome    back---- [facility] [place]   Cold extremities    BLE   COPD (chronic obstructive pulmonary disease) ([place])    DDD (degenerative disc disease), lumbar    Diabetic peripheral neuropathy ([place])    Gastroparesis    followed by dr Henrene Pastor   GERD (gastroesophageal reflux disease)    Hiatal hernia    History of bladder cancer urologist-  previously dr Consuella Lose;  now dr gay   papillay TCC (Ta G1)  s/p TURBT and chemo instillation 2014   History of chest pain 12/2017   heart cath normal   History of encephalopathy 05/27/2015   admission w/ acute encephalopathy thought to be secondary to pain meds and COPD   History of gastric ulcer    History of Helicobacter pylori infection    History of kidney stones    History of TIA (transient ischemic attack) 2008  and 10-19-2018     no residual's   History of traumatic head injury 2010   w/ LOC  per pt needed stitches, hit in head with a mower blade   Hyperlipidemia    Hypertension    Hypogonadism male    s/p  bilateral orchiectomy   Hypothyroidism    Insomnia    Mild obstructive sleep apnea    study in epic 12-04-2016, no cpap   PTSD (post-traumatic stress disorder)    chronic   PTSD (post-traumatic stress disorder)    RA (rheumatoid arthritis) ([place])    followed by [facility]   Seizures, transient [facility]) neurologist-  dr Krista Blue--  differential dx complex partial seizure .vs.  mood disorder .vs.  pseudoseizure--  negative EEG's   confusion episodes and staring spells since 11/ 2015   (03-26-2020 per pt wife last seizure 10 /2021)   Transient confusion NEUOROLOGIST-  DR Krista Blue   Episodes since 11/ 2015--  neurologist dx  differential complex partial seizure  .vs. mood disorder . vs. pseudoseizure   Type 2 diabetes mellitus treated with insulin [facility])    endocrinologist--- dr Loanne Drilling---  (03-26-2020 pt does not check blood sugar at home)    Patient Active Problem List   Diagnosis Date Noted  Cerebral thrombosis with cerebral infarction 11/16/2020   Stroke-like symptoms 11/14/2020   Hyperkalemia 11/14/2020   AKI (acute kidney injury) ([place]) 11/14/2020   Ischemic stroke ([place]) 06/10/2020   Hyperglycemia    Gangrene of finger of left hand (HCC)    Pain due to onychomycosis of toenails of both feet 01/28/2020   Hyperglycemia due to type 2 diabetes mellitus ([place]) 01/07/2020   Resistance to insulin 01/07/2020   Right leg weakness 04/24/2019   Right leg pain 04/24/2019   Gait abnormality 04/24/2019   TIA (transient ischemic attack) 10/20/2018   Uncontrolled type 2 DM with hyperosmolar nonketotic hyperglycemia ([place]) 10/19/2018   Left sided numbness 10/19/2018   Diabetic autonomic neuropathy associated with type 2 diabetes mellitus ([place]) 02/01/2018   DM type 2 with diabetic peripheral neuropathy ([place])  02/01/2018   Dyslipidemia 01/31/2018   Coronary artery disease involving native coronary artery of native heart without angina pectoris 01/31/2018   Chest pain 01/01/2018   Seizure disorder ([place]) 12/19/2016   Essential tremor 12/19/2016   Polypharmacy 12/19/2016   Chronic post-traumatic stress disorder (PTSD) 06/16/2016   Tremor 06/16/2016   Tobacco use disorder 05/05/2016   Psychophysiological insomnia 06/02/2015   Altered mental status 05/28/2015   Acute bronchitis 05/28/2015   Type 2 diabetes mellitus with hyperglycemia ([place]) 05/28/2015   Hypothyroidism 05/28/2015   Bipolar 2 disorder ([place]) 05/28/2015   History of rheumatoid arthritis 05/28/2015   Chronic pain 05/28/2015   Depression 03/18/2015   Carpal tunnel syndrome on left 02/10/2015   Carpal tunnel syndrome on right 02/10/2015   Diabetes ([place]) 01/23/2015   Obesity (BMI 30-39.9) 03/06/2014   Acute encephalopathy 03/05/2014   HLD (hyperlipidemia) 03/05/2014   Anemia, normocytic normochromic 03/05/2014   Altered mental state    Helicobacter pylori (H. pylori) infection 11/20/2012   Protein-calorie malnutrition, severe ([place]) 10/09/2012   Dysphagia 08/19/2012   Gastroparesis 08/19/2012   Bladder tumor 08/08/2012   Biliary dyskinesia 11/02/2010    Past Surgical History:  Procedure Laterality Date   AMPUTATION Left 04/28/2020   Procedure: LEFT LITTLE FINGER AMPUTATION;  Surgeon: Newt Minion, MD;  Location: [place];  Service: Orthopedics;  Laterality: Left;   CARDIAC CATHETERIZATION  12-27-2001  DR Einar Gip  &  05-26-2009  DR Irish Lack   RESULTS FOR BOTH ARE NORMAL CORONARIES AND PERSERVED LVF/ EF 60%   CARPAL TUNNEL RELEASE Bilateral right 09-16-2003;  left ?   CARPAL TUNNEL RELEASE Left 02/25/2015   Procedure: LEFT CARPAL TUNNEL RELEASE;  Surgeon: Leanora Cover, MD;  Location: [place];  Service: Orthopedics;  Laterality: Left;   CYSTOSCOPY N/A 10/10/2012   Procedure: CYSTOSCOPY CLOT EVACUATION FULGERATION OF  BLEEDERS ;  Surgeon: Claybon Jabs, MD;  Location: [facility];  Service: Urology;  Laterality: N/A;   CYSTOSCOPY WITH BIOPSY N/A 11/26/2015   Procedure: CYSTOSCOPY WITH BIOPSY AND FULGURATION;  Surgeon: Kathie Rhodes, MD;  Location: [place];  Service: Urology;  Laterality: N/A;   ESOPHAGOGASTRODUODENOSCOPY  2014   LAPAROSCOPIC CHOLECYSTECTOMY  11-17-2010   ORCHIECTOMY Right 02/21/2016   Procedure: SCROTAL ORCHIECTOMY with TESTICULAR PROSTHESIS IMPLANT;  Surgeon: Kathie Rhodes, MD;  Location: [facility];  Service: Urology;  Laterality: Right;   ORCHIECTOMY Left 09/02/2018   Procedure: ORCHIECTOMY;  Surgeon: Kathie Rhodes, MD;  Location: [facility];  Service: Urology;  Laterality: Left;   ROTATOR CUFF REPAIR Right 12/2004   TRANSURETHRAL RESECTION OF BLADDER TUMOR N/A 08/09/2012   Procedure: TRANSURETHRAL RESECTION OF BLADDER TUMOR (TURBT)  WITH GYRUS WITH MITOMYCIN C;  Surgeon: Claybon Jabs, MD;  Location: [facility];  Service: Urology;  Laterality: N/A;   TRANSURETHRAL RESECTION OF BLADDER TUMOR N/A 03/29/2020   Procedure: TRANSURETHRAL RESECTION OF BLADDER TUMOR (TURBT) and post-op instillation of gemcitabine;  Surgeon: Janith Lima, MD;  Location: [facility];  Service: Urology;  Laterality: N/A;   TRANSURETHRAL RESECTION OF BLADDER TUMOR WITH GYRUS (TURBT-GYRUS) N/A 02/27/2014   Procedure: TRANSURETHRAL RESECTION OF BLADDER TUMOR WITH GYRUS (TURBT-GYRUS);  Surgeon: Claybon Jabs, MD;  Location: [facility];  Service: Urology;  Laterality: N/A;       Family History  Problem Relation Age of Onset   Diabetes Mother    Diabetes Father    Hypertension Father    Heart attack Father 69       died age 29   Alcohol abuse Father    Colon cancer Neg Hx    Esophageal cancer Neg Hx    Stomach cancer Neg Hx    Rectal cancer Neg Hx     Social History   Tobacco Use   Smoking status:  Every Day    Packs/day: 1.50    Years: 38.00    Pack years: 57.00    Types: Cigarettes   Smokeless tobacco: Never  Vaping Use   Vaping Use: Never used  Substance Use Topics   Alcohol use: No   Drug use: No    Home Medications Prior to Admission medications   Medication Sig Start Date End Date Taking? Authorizing Provider  amoxicillin-clavulanate (AUGMENTIN) 875-125 MG tablet Take 1 tablet by mouth every 12 (twelve) hours. 01/14/21   Francene Finders, PA-C  aspirin EC 81 MG EC tablet Take 1 tablet (81 mg total) by mouth daily. Swallow whole. 11/16/20   Patrecia Pour, MD  busPIRone (BUSPAR) 5 MG tablet Take by mouth.    [provider]  clopidogrel (PLAVIX) 75 MG tablet Take 1 tablet (75 mg total) by mouth daily. 07/28/20   Marcial Pacas, MD  Continuous Blood Gluc Sensor (FREESTYLE LIBRE 2 SENSOR) MISC 1 Device by Does not apply route every 14 (fourteen) days. Patient taking differently: 1 Device by Does not apply route every 14 (fourteen) days. Has not received- waiting on Insurance 02/25/20   Renato Shin, MD  Dulaglutide (TRULICITY) 4.5 YT/0.1SW SOPN Inject 4.5 mg into the skin once a week. Patient taking differently: Inject 4.5 mg into the skin every Thursday. 01/22/20   Renato Shin, MD  DULoxetine (CYMBALTA) 60 MG capsule Take 1 capsule (60 mg total) by mouth daily. 07/28/20   Marcial Pacas, MD  fenofibrate 160 MG tablet Take 160 mg by mouth daily. 12/31/19   [provider]  insulin glargine, 2 Unit Dial, (TOUJEO MAX SOLOSTAR) 300 UNIT/ML Solostar Pen Inject 180 Units into the skin every morning. And pen needles 1/day Patient taking differently: Inject 180 Units into the skin in the morning. 02/25/20   Renato Shin, MD  isosorbide mononitrate (IMDUR) 120 MG 24 hr tablet Take 1 tablet (120 mg total) by mouth daily. Please make annual appt in April with Dr. Percival Spanish for refills. Thank you Patient taking differently: Take 120 mg by mouth daily. 05/05/19   Minus Breeding, MD   lamoTRIgine (LAMICTAL) 150 MG tablet Take 1 tablet (150 mg total) by mouth 2 (two) times daily. 07/28/20   Marcial Pacas, MD  levothyroxine (SYNTHROID, LEVOTHROID) 25 MCG tablet Take 25 mcg by mouth daily before breakfast.  [provider]  lurasidone (LATUDA) 40 MG TABS tablet Take 1 tablet (40 mg total) by mouth daily with breakfast. 08/25/20   Arfeen, Arlyce Harman, MD  metFORMIN (GLUCOPHAGE-XR) 500 MG 24 hr tablet Take 500 mg by mouth 2 (two) times daily. 08/24/18   [provider]  metoCLOPramide (REGLAN) 10 MG tablet Take by mouth 3 (three) times daily with meals.    [provider]  morphine (MS CONTIN) 60 MG 12 hr tablet Take 60 mg by mouth 2 (two) times daily. Every 12 hours 06/12/19   [provider]  morphine (MSIR) 15 MG tablet Take 15 mg by mouth 2 (two) times daily as needed. For breakthrough pain  (03-26-2020 per pt wife pt takes this twice also 06/11/19   [provider]  omeprazole (PRILOSEC) 40 MG capsule Take 40 mg by mouth daily.    [provider]  Pancrelipase, Lip-Prot-Amyl, (ZENPEP) 5000-24000 units CPEP Take 1-2 capsules by mouth See admin instructions. Takes 1 capsule in the morning and 2 capsules at night    [provider]  pregabalin (LYRICA) 225 MG capsule Take one cap midday, one cap in evening. Patient taking differently: Take 225-550 mg by mouth 2 (two) times daily. 1 capsule in the morning and 2 capsules at bedtime 07/28/20   Marcial Pacas, MD  pregabalin (LYRICA) 225 MG capsule Take 1 capsule (225 mg total) by mouth every morning. 07/28/20   Marcial Pacas, MD  rosuvastatin (CRESTOR) 10 MG tablet Take 1 tablet (10 mg total) by mouth daily. 11/16/20   Patrecia Pour, MD  sucralfate (CARAFATE) 1 g tablet Take 1 g by mouth 2 (two) times daily. 01/26/20   [provider]  tamsulosin (FLOMAX) 0.4 MG CAPS capsule Take 0.8 mg by mouth at bedtime.    [provider]  traZODone (DESYREL) 150 MG tablet Take 1 tablet  (150 mg total) by mouth at bedtime. 11/08/20   Arfeen, Arlyce Harman, MD  venlafaxine XR (EFFEXOR-XR) 150 MG 24 hr capsule TAKE 1 CAPSULE BY MOUTH ONCE DAILY WITH BREAKFAST 12/13/20   Marcial Pacas, MD  Vitamin D, Ergocalciferol, (DRISDOL) 1.25 MG (50000 UNIT) CAPS capsule Take 50,000 Units by mouth every Wednesday. 12/26/19   [provider]    Allergies    Celebrex [celecoxib], Hydrocodone, and Sulfa antibiotics  Review of Systems   Review of Systems  Constitutional:  Negative for chills and fever.  HENT:  Negative for ear pain and sore throat.   Eyes:  Negative for pain and visual disturbance.  Respiratory:  Negative for cough and shortness of breath.   Cardiovascular:  Negative for chest pain and palpitations.  Gastrointestinal:  Negative for abdominal pain and vomiting.  Genitourinary:  Negative for dysuria and hematuria.  Musculoskeletal:  Negative for arthralgias and back pain.  Skin:  Positive for rash and wound. Negative for color change.  Neurological:  Negative for seizures and syncope.  All other systems reviewed and are negative.  Physical Exam Updated Vital Signs BP (!) 155/66 (BP Location: Right Arm)   Pulse 80   Temp 97.8 F (36.6 C) (Oral)   Resp 19   Ht 6' (1.829 m)   Wt 99.8 kg   SpO2 98%   BMI 29.84 kg/m   Physical Exam Vitals and nursing note reviewed.  Constitutional:      Appearance: He is well-developed.  HENT:     Head: Normocephalic and atraumatic.  Eyes:     Conjunctiva/sclera: Conjunctivae normal.  Cardiovascular:  Rate and Rhythm: Normal rate and regular rhythm.     Heart sounds: No murmur heard. Pulmonary:     Effort: Pulmonary effort is normal. No respiratory distress.  Genitourinary:    Comments: There is approximately 4 x 4 centimeter area of induration and swelling with some underlying fluctuance to the right upper gluteal cleft region, no significant overlying erythema Musculoskeletal:     Cervical back: Neck supple.  Skin:     General: Skin is warm and dry.  Neurological:     General: No focal deficit present.     Mental Status: He is alert.  Psychiatric:        Mood and Affect: Mood normal.    ED Results / Procedures / Treatments   Labs (all labs ordered are listed, but only abnormal results are displayed) Labs Reviewed - No data to display  EKG None  Radiology No results found.  Procedures .Marland KitchenIncision and Drainage  Date/Time: 01/21/2021 8:06 AM Performed by: Lucrezia Starch, MD Authorized by: Lucrezia Starch, MD   Consent:    Consent obtained:  Verbal   Consent given by:  Patient   Risks, benefits, and alternatives were discussed: yes     Risks discussed:  Damage to other organs, infection, pain, incomplete drainage and bleeding   Alternatives discussed:  No treatment, delayed treatment, alternative treatment and observation Universal protocol:    Immediately prior to procedure, a time out was called: yes     Patient identity confirmed:  Verbally with patient Location:    Type:  Abscess   Size:  4   Location:  Anogenital   Anogenital location:  Gluteal cleft Pre-procedure details:    Skin preparation:  Povidone-iodine Sedation:    Sedation type:  None Anesthesia:    Anesthesia method:  Local infiltration   Local anesthetic:  Lidocaine 1% w/o epi Procedure type:    Complexity:  Complex Procedure details:    Incision types:  Single straight   Wound management:  Probed and deloculated and extensive cleaning   Drainage:  Purulent   Drainage amount:  Copious   Wound treatment:  Drain placed   Packing materials:  1/4 in iodoform gauze Post-procedure details:    Procedure completion:  Tolerated well, no immediate complications   Medications Ordered in ED Medications - No data to display   ED Course  I have reviewed the triage vital signs and the nursing notes.  Pertinent labs & imaging results that were available during my care of the patient were reviewed by me and  considered in my medical decision making (see chart for details).    MDM Rules/Calculators/A&P                            54 year old gentleman presenting to the ER with concern for abscess to right buttocks.  He was able to achieve good drainage from bedside I&D.  Placed quarter-inch gauze packing.  Instructed patient to return to ER in 2 to 3 days for wound recheck and packing removal or follow-up with primary doctor to have this performed.  Reviewed return precautions, recommended he continue the previously prescribed doxycycline on Augmentin.  Discharged home.  No systemic symptoms.  Well-appearing.  After the discussed management above, the patient was determined to be safe for discharge.  The patient was in agreement with this plan and all questions regarding their care were answered.  ED return precautions were discussed and the patient will return  to the ED with any significant worsening of condition.  Final Clinical Impression(s) / ED Diagnoses Final diagnoses:  Abscess    Rx / DC Orders ED Discharge Orders     None        Lucrezia Starch, MD 01/21/21 [phone]

## 2021-01-20 NOTE — ED Triage Notes (Signed)
Pt c/o abscess to left hip "tailbone" area. States was seen at this UC and given abx without resolution. States "it needs to be lanced to let the pressure off of it."

## 2021-01-30 ENCOUNTER — Other Ambulatory Visit: Payer: Self-pay | Admitting: Neurology

## 2021-01-30 DIAGNOSIS — F4312 Post-traumatic stress disorder, chronic: Secondary | ICD-10-CM

## 2021-02-11 ENCOUNTER — Encounter (HOSPITAL_COMMUNITY): Payer: Self-pay | Admitting: Psychiatry

## 2021-02-11 ENCOUNTER — Other Ambulatory Visit: Payer: Self-pay

## 2021-02-11 ENCOUNTER — Telehealth (HOSPITAL_BASED_OUTPATIENT_CLINIC_OR_DEPARTMENT_OTHER): Payer: No Typology Code available for payment source | Admitting: Psychiatry

## 2021-02-11 VITALS — Wt 220.0 lb

## 2021-02-11 DIAGNOSIS — F3181 Bipolar II disorder: Secondary | ICD-10-CM | POA: Diagnosis not present

## 2021-02-11 DIAGNOSIS — F4312 Post-traumatic stress disorder, chronic: Secondary | ICD-10-CM

## 2021-02-11 MED ORDER — TRAZODONE HCL 100 MG PO TABS: 100.0000 mg | ORAL_TABLET | Freq: Every evening | ORAL | 1 refills | Status: DC | PRN

## 2021-02-11 MED ORDER — LURASIDONE HCL 40 MG PO TABS: 40.0000 mg | ORAL_TABLET | Freq: Every day | ORAL | 1 refills | Status: DC

## 2021-02-11 NOTE — Progress Notes (Signed)
Virtual Visit via Telephone Note  I connected with Vincent [name] on 02/11/21 at 11:40 AM EST by telephone and verified that I am speaking with the correct person using two identifiers.  Location: Patient: Home Provider: Home Office   I discussed the limitations, risks, security and privacy concerns of performing an evaluation and management service by telephone and the availability of in person appointments. I also discussed with the patient that there may be a patient responsible charge related to this service. The patient expressed understanding and agreed to proceed.   History of Present Illness: Patient is evaluated by phone session.  He is not seen in the office for more than 6 months.  He is out of Latuda for 2 months and admitted extreme irritability, paranoia, anger, talking to himself.  He feels his brain is in a fog.  He is not sure why he missed the appointment.  He endorsed few weeks ago he was having suicidal thoughts but no plan or any intent.  These thoughts went away after the reassurance from his wife.  Patient was admitted in September because of weakness and recently seen in the emergency room for abscess.  He has multiple health issues including neuropathy, seizures, diabetes, stroke, hypertension, COPD and chronic pain.  He is seeing a neurologist who is prescribing Lyrica.  When I ask about his other antidepressant he recalls taking Effexor but not Cymbalta.  He is not sure if he is taking trazodone and admitted not sleeping well as talk to himself in the night.  Sometime he picked up the phone thinking that somebody is calling him but no one answered.  He is taking morphine sulfate that helps some of his pain.  Patient lives with his wife who is supportive.  He endorses flashbacks and nightmares are coming back.  He has highs and lows and sometimes he does not like people around.  Denies any aggression or violence but like to go back on [place].  His appetite is okay and he had  gained weight since last visit.  Past Psychiatric History: Reviewed. H/O one brief hospitalization at [facility] due to suicidal thoughts.  No h/o suicidal attempt.  H/O anger, anxiety, PTSD and heavy substance use.  Claims to be sober for while. Tried Depakote, Zoloft, lithium, Prozac and Xanax. [name] helped but stopped due to polypharmacy.  Psychiatric Specialty Exam: Physical Exam  Review of Systems  Weight 220 lb (99.8 kg).Body mass index is 29.84 kg/m.  General Appearance: NA  Eye Contact:  NA  Speech:  Slow  Volume:  Decreased  Mood:  Anxious, Depressed, and Irritable  Affect:  NA  Thought Process:  Descriptions of Associations: Circumstantial  Orientation:  Full (Time, Place, and Person)  Thought Content:  Hallucinations: Auditory, Ideas of Reference:   Paranoia, Paranoid Ideation, and Rumination  Suicidal Thoughts:  No  Homicidal Thoughts:  No  Memory:  Immediate;   Fair Recent;   Fair Remote;   Fair  Judgement:  Fair  Insight:  Shallow  Psychomotor Activity:  NA  Concentration:  Concentration: Fair and Attention Span: Fair  Recall:  [employer] of Knowledge:  Fair  Language:  Fair  Akathisia:  No  Handed:  Right  AIMS (if indicated):     Assets:  Communication Skills Desire for Improvement Housing Social Support  ADL's:  Intact  Cognition:  Impaired,  Mild  Sleep:   poor      Assessment and Plan: Bipolar disorder type II.  PTSD.  I review history, recent blood work results and current medication.  Patient is not taking Cymbalta, not taking BuSpar.  He believes he is taking Effexor but not sure about the trazodone.  He is taking Lyrica morphine and Lamictal from neurologist.  We talked about restarting Latuda which we got approval from his insurance until next year June.  He was taking 40 mg a day and we will continue that.  We also talked about restarting trazodone if he has not started yet since it will help his sleep.  He used to take current 50 mg  however I would recommend to try 100 mg for now.  Patient told he is taking Effexor XR 150 mg and we will continue that and it was recently refilled by her neurologist.  I encouraged to keep his appointment for better aftercare.  Patient told his wife is not present at this moment and I recommend next appointment in person to bring all his list of medication.  Discussed safety concerns and anytime having active suicidal thoughts or homicidal thought that he need to call 911 or go to local emergency room.  Follow-up in 6 weeks.  Follow Up Instructions:    I discussed the assessment and treatment plan with the patient. The patient was provided an opportunity to ask questions and all were answered. The patient agreed with the plan and demonstrated an understanding of the instructions.   The patient was advised to call back or seek an in-person evaluation if the symptoms worsen or if the condition fails to improve as anticipated.  I provided 27 minutes of non-face-to-face time during this encounter.   Kathlee Nations, MD

## 2021-03-01 ENCOUNTER — Other Ambulatory Visit: Payer: Self-pay | Admitting: Neurology

## 2021-03-21 DIAGNOSIS — G909 Disorder of the autonomic nervous system, unspecified: Secondary | ICD-10-CM | POA: Diagnosis not present

## 2021-03-21 DIAGNOSIS — I2510 Atherosclerotic heart disease of native coronary artery without angina pectoris: Secondary | ICD-10-CM | POA: Diagnosis not present

## 2021-03-21 DIAGNOSIS — K5903 Drug induced constipation: Secondary | ICD-10-CM | POA: Diagnosis not present

## 2021-03-21 DIAGNOSIS — K3184 Gastroparesis: Secondary | ICD-10-CM | POA: Diagnosis not present

## 2021-03-21 DIAGNOSIS — M069 Rheumatoid arthritis, unspecified: Secondary | ICD-10-CM | POA: Diagnosis not present

## 2021-03-21 DIAGNOSIS — I10 Essential (primary) hypertension: Secondary | ICD-10-CM | POA: Diagnosis not present

## 2021-03-21 DIAGNOSIS — I679 Cerebrovascular disease, unspecified: Secondary | ICD-10-CM | POA: Diagnosis not present

## 2021-03-21 DIAGNOSIS — E1142 Type 2 diabetes mellitus with diabetic polyneuropathy: Secondary | ICD-10-CM | POA: Diagnosis not present

## 2021-03-21 DIAGNOSIS — Z0000 Encounter for general adult medical examination without abnormal findings: Secondary | ICD-10-CM | POA: Diagnosis not present

## 2021-03-28 DIAGNOSIS — K3184 Gastroparesis: Secondary | ICD-10-CM | POA: Diagnosis not present

## 2021-03-28 DIAGNOSIS — F17290 Nicotine dependence, other tobacco product, uncomplicated: Secondary | ICD-10-CM | POA: Diagnosis not present

## 2021-03-28 DIAGNOSIS — M069 Rheumatoid arthritis, unspecified: Secondary | ICD-10-CM | POA: Diagnosis not present

## 2021-03-28 DIAGNOSIS — E1142 Type 2 diabetes mellitus with diabetic polyneuropathy: Secondary | ICD-10-CM | POA: Diagnosis not present

## 2021-03-31 ENCOUNTER — Other Ambulatory Visit: Payer: Self-pay

## 2021-03-31 ENCOUNTER — Ambulatory Visit (HOSPITAL_BASED_OUTPATIENT_CLINIC_OR_DEPARTMENT_OTHER): Payer: No Typology Code available for payment source | Admitting: Psychiatry

## 2021-03-31 ENCOUNTER — Encounter (HOSPITAL_COMMUNITY): Payer: Self-pay | Admitting: Psychiatry

## 2021-03-31 DIAGNOSIS — F4312 Post-traumatic stress disorder, chronic: Secondary | ICD-10-CM | POA: Diagnosis not present

## 2021-03-31 DIAGNOSIS — F3181 Bipolar II disorder: Secondary | ICD-10-CM | POA: Diagnosis not present

## 2021-03-31 MED ORDER — LURASIDONE HCL 60 MG PO TABS: 60.0000 mg | ORAL_TABLET | Freq: Every day | ORAL | 2 refills | Status: DC

## 2021-03-31 MED ORDER — TRAZODONE HCL 100 MG PO TABS: 100.0000 mg | ORAL_TABLET | Freq: Every evening | ORAL | 2 refills | Status: DC | PRN

## 2021-03-31 NOTE — Progress Notes (Signed)
Location: Patient: Office Provider: Office     History of Present Illness: Patient came for his follow-up appointment in person.  He brought all his medication with him.  He is taking Latuda, Effexor, Cymbalta and trazodone.  He also prescribed Lamictal from his neurologist along with Lyrica.  He reported his Christmas was okay.  He admitted having issues with his wife and sometimes he gets upset with her.  He also endorsed getting along with her kids okay.  He has been married for 22 years.  He is also taking medication for her diabetes, pain but not consistent with metformin Trulicity because he is out of the refills.  Denies any crying spells or any feeling of hopelessness but admitted mood swings and irritability.  Patient also have cognitive impairment and sometimes he does not remember to take the medication on time.  His wife helps him her pillbox.  He endorses nightmares and flashback metabolized to 3 times in the week.  Due to frequent urination he has to up nighttime.  His appetite is okay.  His weight is unchanged from the past.  He does drive and sometimes he walks but otherwise he stays home by himself.  Past Psychiatric History: Reviewed. H/O one brief hospitalization at [facility] due to suicidal thoughts.  No h/o suicidal attempt.  H/O anger, anxiety, PTSD and heavy substance use.  Claims to be sober for while. Tried Depakote, Zoloft, lithium, Prozac and Xanax. [name] helped but stopped due to polypharmacy.  Psychiatric Specialty Exam: Physical Exam  Review of Systems  Blood pressure 120/69, pulse 86, temperature 98.2 F (36.8 C), temperature source Oral, height 6' (1.829 m), weight 207 lb 3.2 oz (94 kg), SpO2 95 %.Body mass index is 28.1 kg/m.  General Appearance: Fairly Groomed  Eye Contact:  Fair  Speech:  Slow  Volume:  Decreased  Mood:  Dysphoric  Affect:  Congruent  Thought Process:  Descriptions of Associations: Intact  Orientation:  Full (Time, Place, and  Person)  Thought Content:  Paranoid Ideation and Rumination  Suicidal Thoughts:  No  Homicidal Thoughts:  No  Memory:  Immediate;   Fair Recent;   Fair Remote;   Fair  Judgement:  Fair  Insight:  Shallow  Psychomotor Activity:  Decreased  Concentration:  Concentration: Fair and Attention Span: Fair  Recall:  [employer] of Knowledge:  Fair  Language:  Fair  Akathisia:  No  Handed:  Right  AIMS (if indicated):     Assets:  Communication Skills Desire for Improvement Housing Transportation  ADL's:  Intact  Cognition:  Impaired,  Mild  Sleep:   fair      Assessment and Plan: Bipolar disorder type II.  PTSD.  I reviewed all his medication.  Despite taking multiple medication he continues to struggle with irritability and mood swings.  I have done medication reconciliation.  He is not taking metformin, vitamin D and Trulicity because he is out of the refills.  He is not taking cholesterol-lowering medication but taking his blood thinners, pain medication, Lamictal.  We talk about psychotropic medication and despite taking trazodone for sleep is on and off.  I explained his insomnia could be his chronic pain and frequent urination and increasing trazodone may cause constipation and urinary retention.  I recommend to keep the trazodone 100 mg for now.  I recommend trying Latuda 60 mg to help his mood lability.  His effexor is given by neurology.Patient is not interested in therapy.  Patient agreed to give  a try.  We will follow up in 3 months.  I recommend to call us back if he feels worsening of the symptoms or anytime having suicidal thoughts or homicidal thoughts.   Follow Up Instructions:    I discussed the assessment and treatment plan with the patient. The patient was provided an opportunity to ask questions and all were answered. The patient agreed with the plan and demonstrated an understanding of the instructions.   I provided 37 minutes of non-face-to-face time during this  encounter.   Kathlee Nations, MD

## 2021-04-11 DIAGNOSIS — M5416 Radiculopathy, lumbar region: Secondary | ICD-10-CM | POA: Diagnosis not present

## 2021-04-11 DIAGNOSIS — E0849 Diabetes mellitus due to underlying condition with other diabetic neurological complication: Secondary | ICD-10-CM | POA: Diagnosis not present

## 2021-04-11 DIAGNOSIS — G8929 Other chronic pain: Secondary | ICD-10-CM | POA: Diagnosis not present

## 2021-04-11 DIAGNOSIS — M5136 Other intervertebral disc degeneration, lumbar region: Secondary | ICD-10-CM | POA: Diagnosis not present

## 2021-04-11 DIAGNOSIS — M47816 Spondylosis without myelopathy or radiculopathy, lumbar region: Secondary | ICD-10-CM | POA: Diagnosis not present

## 2021-04-15 DIAGNOSIS — M069 Rheumatoid arthritis, unspecified: Secondary | ICD-10-CM | POA: Diagnosis not present

## 2021-04-15 DIAGNOSIS — C7911 Secondary malignant neoplasm of bladder: Secondary | ICD-10-CM | POA: Diagnosis not present

## 2021-04-15 DIAGNOSIS — E1142 Type 2 diabetes mellitus with diabetic polyneuropathy: Secondary | ICD-10-CM | POA: Diagnosis not present

## 2021-04-15 DIAGNOSIS — I2510 Atherosclerotic heart disease of native coronary artery without angina pectoris: Secondary | ICD-10-CM | POA: Diagnosis not present

## 2021-04-20 DIAGNOSIS — N312 Flaccid neuropathic bladder, not elsewhere classified: Secondary | ICD-10-CM | POA: Diagnosis not present

## 2021-04-20 DIAGNOSIS — D414 Neoplasm of uncertain behavior of bladder: Secondary | ICD-10-CM | POA: Diagnosis not present

## 2021-04-26 ENCOUNTER — Other Ambulatory Visit (HOSPITAL_COMMUNITY): Payer: Self-pay | Admitting: Psychiatry

## 2021-04-26 DIAGNOSIS — F3181 Bipolar II disorder: Secondary | ICD-10-CM

## 2021-05-03 NOTE — Progress Notes (Unsigned)
PATIENT: Vincent [name] DOB: Jul 25, 1965  REASON FOR VISIT: Follow up for seizures, stroke like symptoms in the setting of significant hyperglycemia HISTORY FROM: Patient PRIMARY NEUROLOGIST: [name]   HISTORICAL Vincent [name] is a 56 year old, seen in request by Dr. Tenny Craw for evaluation of right leg pain, gait abnormality,he is accompanied by his wife at today's visit on April 24, 2019.   I have reviewed and summarized the referring note from the referring physician.  He has past medical history of hypertension, hyperlipidemia,  insulin-dependent poorly controlled diabetes, bipolar disorder, history of bladder cancer, chronic pain, is under pain management, longtime smoker,   Our office saw him since 2015, because he had recurrent episode of confusion, some nocturnal seizure-like episode, previous work-up including MRI of the brain, and EEG was normal in 2015.  He was started on lamotrigine, tolerating it well, it has helped his recurrent spells, he is now taking lamotrigine 150 mg daily, last visit with office was in October 2018 with Vincent [name].   Previous EMG nerve conduction study in November 2019 has confirmed length dependent moderate axonal sensorimotor polyneuropathy, consistent with his poorly controlled diabetes   He was able to ambulate without assistant until end of October 2020, woke up 1 morning, noticed deep achy pain at right anterior and medial time, radiating to right medial leg, and ankle, his pain gradually getting worse, 3 weeks later, he noticed gradual onset right leg weakness, to the point of difficulty picking up his right hip against gravity, at the same time, his right leg pain intensified,   He was seen by neurosurgeon [name], who referred him for EMG nerve conduction study by [name] on April 08, 2019, there is evidence of severe sensorimotor polyneuropathy, in addition, there is evidence of active denervation at right vastus lateralis,  medialis, iliopsoas, adductor longus, but there was no paraspinal muscle active denervation's.  Above findings with the possibility of diabetic amyotrophy   Since his symptoms onset end of October 2020, he continue complains of gradual worsening right thigh pain, right leg weakness, gait abnormality.  He is receiving home physical therapy   I was able to review his office visit at [facility] on February 20, 2019, he has been under pain management for extended period of time, previously taking fentanyl 75 mcg per 72 hours, Lyrica 150 mg 3 times daily, previously was a patient of [facility], when that office was closed, he seek pain management at [facility], had his first right sacroiliac joint injection on February 20, 2019 by [name]     Personally reviewed MRIs from [facility] in February 2021 MRI of cervical spine mild degenerative changes, no significant canal or foraminal narrowing MRI of brain: No significant abnormality MRI of lumbar spine in November 2020 it was normal MRI of the brain October 20, 2018 that was normal   Laboratory evaluations in 2020, A1c was 10.3, normal CBC, BMP with exception of elevated glucose 298,   UPDATE Jul 28 2020: He is accompanied by his wife at today's clinical visit, he presented to emergency room on June 11, 2020 reported acute onset of dysarthria, right-sided weakness, numbness, involving face, arm, and leg, also complains of chest pain, shortness of breath, lightheadedness   He was treated as acute stroke, after CT head showed no significant abnormality, and no large vessel disease on CT angiogram of head and neck, he was giving IV tPA, ICU for close monitoring, MRI of the brain  reviewed with patient, no acute abnormality   He was discharged home with Plavix 75 mg   He had transient hyponatremia, acute renal failure, improved with IV hydration   Today his main concern is bilateral feet paresthesia, pain, difficulty bearing  weight, A1c was 11.4, is under the care of endocrinologist [name]   He has poor functional status, spent most of the time in sitting position, no recurrent seizure-like activity, also carries a diagnosis of bipolar disorder, on polypharmacy treatment, including combination of gabapentin, and Lyrica 200 mg 3 times a day  Update May 04, 2021 SS:   Admitted in September 2022 for left-sided numbness and weakness, glucose was 542, MRI negative for acute stroke.  EF 60 to 65%, LDL 137, A1c 11.8, creatinine 1.73.   In the past admitted several times with focal neurological deficits and hyperglycemia greater than 500, MRI has been negative x3 for infarct.  CTA head and neck stable with right ICA 50% stenosis over time.  Recommended to continue Plavix alone.  On fenofibrate and Crestor.  REVIEW OF SYSTEMS: Out of a complete 14 system review of symptoms, the patient complains only of the following symptoms, and all other reviewed systems are negative.  See HPI  ALLERGIES: Allergies  Allergen Reactions   Celebrex [Celecoxib] Anaphylaxis and Rash   Hydrocodone Rash and Other (See Comments)    "blisters developed on arms"   Sulfa Antibiotics Rash    HOME MEDICATIONS: Outpatient Medications Prior to Visit  Medication Sig Dispense Refill   aspirin EC 81 MG EC tablet Take 1 tablet (81 mg total) by mouth daily. Swallow whole. 30 tablet 0   clopidogrel (PLAVIX) 75 MG tablet Take 1 tablet (75 mg total) by mouth daily. 90 tablet 4   Continuous Blood Gluc Sensor (FREESTYLE LIBRE 2 SENSOR) MISC 1 Device by Does not apply route every 14 (fourteen) days. (Patient taking differently: 1 Device by Does not apply route every 14 (fourteen) days. Has not received- waiting on Insurance) 6 each 3   Dulaglutide (TRULICITY) 4.5 ZJ/6.9CV SOPN Inject 4.5 mg into the skin once a week. (Patient not taking: Reported on 03/31/2021) 6 mL 3   fenofibrate 160 MG tablet Take 160 mg by mouth daily.     insulin glargine,  2 Unit Dial, (TOUJEO MAX SOLOSTAR) 300 UNIT/ML Solostar Pen Inject 180 Units into the skin every morning. And pen needles 1/day (Patient taking differently: Inject 180 Units into the skin in the morning.) 24 mL 11   lamoTRIgine (LAMICTAL) 150 MG tablet Take 1 tablet (150 mg total) by mouth 2 (two) times daily. 180 tablet 4   levothyroxine (SYNTHROID, LEVOTHROID) 25 MCG tablet Take 25 mcg by mouth daily before breakfast.  (Patient not taking: Reported on 03/31/2021)     Lurasidone HCl (LATUDA) 60 MG TABS Take 1 tablet (60 mg total) by mouth daily with breakfast. 30 tablet 2   metFORMIN (GLUCOPHAGE-XR) 500 MG 24 hr tablet Take 500 mg by mouth 2 (two) times daily.     morphine (MS CONTIN) 60 MG 12 hr tablet Take 60 mg by mouth 2 (two) times daily. Every 12 hours     morphine (MSIR) 15 MG tablet Take 15 mg by mouth 2 (two) times daily as needed. For breakthrough pain  (03-26-2020 per pt wife pt takes this twice also     omeprazole (PRILOSEC) 40 MG capsule Take 40 mg by mouth daily.     pregabalin (LYRICA) 225 MG capsule Take 1 capsule by mouth  in the morning 30 capsule 5   rosuvastatin (CRESTOR) 10 MG tablet Take 1 tablet (10 mg total) by mouth daily. 30 tablet 0   tamsulosin (FLOMAX) 0.4 MG CAPS capsule Take 0.8 mg by mouth at bedtime. (Patient not taking: Reported on 03/31/2021)     traZODone (DESYREL) 100 MG tablet Take 1 tablet (100 mg total) by mouth at bedtime as needed for sleep. 30 tablet 2   venlafaxine XR (EFFEXOR-XR) 150 MG 24 hr capsule TAKE 1 CAPSULE BY MOUTH ONCE DAILY WITH BREAKFAST 90 capsule 1   Vitamin D, Ergocalciferol, (DRISDOL) 1.25 MG (50000 UNIT) CAPS capsule Take 50,000 Units by mouth every Wednesday. (Patient not taking: Reported on 03/31/2021)     No facility-administered medications prior to visit.    PAST MEDICAL HISTORY: Past Medical History:  Diagnosis Date   Anxiety    Benign essential tremor    Bipolar 2 disorder ([place])    followed by [facility]--- dr s. Adele Schilder   Bladder  cancer [facility])    recurrent   CAD (coronary artery disease)    cardiac cath 2003  and 2011 both showed normal coronary arteries w/ preserved lvf;  Non obstructive on CTA Oct 2019.    Chronic pain syndrome    back---- [facility] [place]   Cold extremities    BLE   COPD (chronic obstructive pulmonary disease) ([place])    DDD (degenerative disc disease), lumbar    Diabetic peripheral neuropathy ([place])    Gastroparesis    followed by dr Henrene Pastor   GERD (gastroesophageal reflux disease)    Hiatal hernia    History of bladder cancer urologist-  previously dr Consuella Lose;  now dr gay   papillay TCC (Ta G1)  s/p TURBT and chemo instillation 2014   History of chest pain 12/2017   heart cath normal   History of encephalopathy 05/27/2015   admission w/ acute encephalopathy thought to be secondary to pain meds and COPD   History of gastric ulcer    History of Helicobacter pylori infection    History of kidney stones    History of TIA (transient ischemic attack) 2008  and 10-19-2018    no residual's   History of traumatic head injury 2010   w/ LOC  per pt needed stitches, hit in head with a mower blade   Hyperlipidemia    Hypertension    Hypogonadism male    s/p  bilateral orchiectomy   Hypothyroidism    Insomnia    Mild obstructive sleep apnea    study in epic 12-04-2016, no cpap   PTSD (post-traumatic stress disorder)    chronic   PTSD (post-traumatic stress disorder)    RA (rheumatoid arthritis) ([place])    followed by [facility]   Seizures, transient [facility]) neurologist-  dr Krista Blue--  differential dx complex partial seizure .vs.  mood disorder .vs.  pseudoseizure--  negative EEG's   confusion episodes and staring spells since 11/ 2015   (03-26-2020 per pt wife last seizure 10 /2021)   Transient confusion NEUOROLOGIST-  DR Krista Blue   Episodes since 11/ 2015--  neurologist dx  differential complex partial seizure  .vs. mood disorder . vs. pseudoseizure   Type 2 diabetes  mellitus treated with insulin [facility])    endocrinologist--- dr Loanne Drilling---  (03-26-2020 pt does not check blood sugar at home)    PAST SURGICAL HISTORY: Past Surgical History:  Procedure Laterality Date   AMPUTATION Left 04/28/2020   Procedure: LEFT LITTLE FINGER AMPUTATION;  Surgeon: Newt Minion, MD;  Location: [place];  Service: Orthopedics;  Laterality: Left;   CARDIAC CATHETERIZATION  12-27-2001  DR Einar Gip  &  05-26-2009  DR Irish Lack   RESULTS FOR BOTH ARE NORMAL CORONARIES AND PERSERVED LVF/ EF 60%   CARPAL TUNNEL RELEASE Bilateral right 09-16-2003;  left ?   CARPAL TUNNEL RELEASE Left 02/25/2015   Procedure: LEFT CARPAL TUNNEL RELEASE;  Surgeon: Leanora Cover, MD;  Location: [place];  Service: Orthopedics;  Laterality: Left;   CYSTOSCOPY N/A 10/10/2012   Procedure: CYSTOSCOPY CLOT EVACUATION FULGERATION OF BLEEDERS ;  Surgeon: Claybon Jabs, MD;  Location[facility];  Service: Urology;  Laterality: N/A;   CYSTOSCOPY WITH BIOPSY N/A 11/26/2015   Procedure: CYSTOSCOPY WITH BIOPSY AND FULGURATION;  Surgeon: Kathie Rhodes, MD;  Location: [place];  Service: Urology;  Laterality: N/A;   ESOPHAGOGASTRODUODENOSCOPY  2014   LAPAROSCOPIC CHOLECYSTECTOMY  11-17-2010   ORCHIECTOMY Right 02/21/2016   Procedure: SCROTAL ORCHIECTOMY with TESTICULAR PROSTHESIS IMPLANT;  Surgeon: Kathie Rhodes, MD;  Location: [facility];  Service: Urology;  Laterality: Right;   ORCHIECTOMY Left 09/02/2018   Procedure: ORCHIECTOMY;  Surgeon: Kathie Rhodes, MD;  Location: [facility];  Service: Urology;  Laterality: Left;   ROTATOR CUFF REPAIR Right 12/2004   TRANSURETHRAL RESECTION OF BLADDER TUMOR N/A 08/09/2012   Procedure: TRANSURETHRAL RESECTION OF BLADDER TUMOR (TURBT) WITH GYRUS WITH MITOMYCIN C;  Surgeon: Claybon Jabs, MD;  Location: [facility];  Service: Urology;  Laterality: N/A;   TRANSURETHRAL RESECTION OF BLADDER  TUMOR N/A 03/29/2020   Procedure: TRANSURETHRAL RESECTION OF BLADDER TUMOR (TURBT) and post-op instillation of gemcitabine;  Surgeon: Janith Lima, MD;  Location: [facility];  Service: Urology;  Laterality: N/A;   TRANSURETHRAL RESECTION OF BLADDER TUMOR WITH GYRUS (TURBT-GYRUS) N/A 02/27/2014   Procedure: TRANSURETHRAL RESECTION OF BLADDER TUMOR WITH GYRUS (TURBT-GYRUS);  Surgeon: Claybon Jabs, MD;  Location: [facility];  Service: Urology;  Laterality: N/A;    FAMILY HISTORY: Family History  Problem Relation Age of Onset   Diabetes Mother    Diabetes Father    Hypertension Father    Heart attack Father 64       died age 46   Alcohol abuse Father    Colon cancer Neg Hx    Esophageal cancer Neg Hx    Stomach cancer Neg Hx    Rectal cancer Neg Hx     SOCIAL HISTORY: Social History   Socioeconomic History   Marital status: Married    Spouse name: Not on file   Number of children: 3   Years of education: GED   Highest education level: Not on file  Occupational History   Occupation: Engineer, technical sales    Comment: Owner of company  Tobacco Use   Smoking status: Every Day    Packs/day: 1.50    Years: 38.00    Pack years: 57.00    Types: Cigarettes   Smokeless tobacco: Never  Vaping Use   Vaping Use: Never used  Substance and Sexual Activity   Alcohol use: No   Drug use: No   Sexual activity: Not Currently    Partners: Female    Birth control/protection: None  Other Topics Concern   Not on file  Social History Narrative   Lives at home with his wife and children.   Left-handed.   3-4 cups caffeine per day.   Social Determinants  of Health   Financial Resource Strain: Not on file  Food Insecurity: Not on file  Transportation Needs: Not on file  Physical Activity: Not on file  Stress: Not on file  Social Connections: Not on file  Intimate Partner Violence: Not on file      PHYSICAL EXAM  There were no vitals filed  for this visit. There is no height or weight on file to calculate BMI.  Generalized: Well developed, in no acute distress   Neurological examination  Mentation: Alert oriented to time, place, history taking. Follows all commands speech and language fluent Cranial nerve II-XII: Pupils were equal round reactive to light. Extraocular movements were full, visual field were full on confrontational test. Facial sensation and strength were normal. Uvula tongue midline. Head turning and shoulder shrug  were normal and symmetric. Motor: The motor testing reveals 5 over 5 strength of all 4 extremities. Good symmetric motor tone is noted throughout.  Sensory: Sensory testing is intact to soft touch on all 4 extremities. No evidence of extinction is noted.  Coordination: Cerebellar testing reveals good finger-nose-finger and heel-to-shin bilaterally.  Gait and station: Gait is normal. Tandem gait is normal. Romberg is negative. No drift is seen.  Reflexes: Deep tendon reflexes are symmetric and normal bilaterally.   DIAGNOSTIC DATA (LABS, IMAGING, TESTING) - I reviewed patient records, labs, notes, testing and imaging myself where available.  Lab Results  Component Value Date   WBC 5.4 11/15/2020   HGB 11.4 (L) 11/15/2020   HCT 33.7 (L) 11/15/2020   MCV 83.0 11/15/2020   PLT 181 11/15/2020      Component Value Date/Time   NA 134 (L) 11/15/2020 0248   NA 137 04/01/2015 1006   K 4.0 11/15/2020 0248   CL 103 11/15/2020 0248   CO2 25 11/15/2020 0248   GLUCOSE 320 (H) 11/15/2020 0248   BUN 24 (H) 11/15/2020 0248   BUN 14 04/01/2015 1006   CREATININE 1.73 (H) 11/15/2020 0248   CALCIUM 8.8 (L) 11/15/2020 0248   PROT 6.5 11/14/2020 1357   PROT 6.0 04/01/2015 1006   ALBUMIN 3.7 11/14/2020 1357   ALBUMIN 4.2 04/01/2015 1006   AST 13 (L) 11/14/2020 1357   ALT 16 11/14/2020 1357   ALKPHOS 67 11/14/2020 1357   BILITOT 0.3 11/14/2020 1357   BILITOT 0.3 04/01/2015 1006   GFRNONAA 47 (L) 11/15/2020  0248   GFRAA 46 (L) 08/09/2019 1206   Lab Results  Component Value Date   CHOL 240 (H) 11/16/2020   HDL 38 (L) 11/16/2020   LDLCALC 137 (H) 11/16/2020   LDLDIRECT 141.9 (H) 10/20/2018   TRIG 325 (H) 11/16/2020   CHOLHDL 6.3 11/16/2020   Lab Results  Component Value Date   HGBA1C 11.8 (H) 11/14/2020   Lab Results  Component Value Date   VITAMINB12 531 04/24/2019   Lab Results  Component Value Date   TSH 1.831 11/14/2020      ASSESSMENT AND PLAN 56 y.o. year old male   1.  Diabetic peripheral neuropathy 2.  Neuropathic pain 3.  Gait abnormality 4.  Bipolar disorder 5.  Polypharmacy treatment 6.  Previous seizure-like activity  -Continue Lamictal 150 mg twice daily for seizure like spells -Continue Plavix 75 mg daily  -Continue working with primary care for management of vascular risk factors for stroke prevention, Diabetes under poor control     Butler Denmark, [place], DNP 05/03/2021, 10:52 AM [facility] [address], [place], [place] [address] [phone]

## 2021-05-04 ENCOUNTER — Encounter: Payer: Self-pay | Admitting: Neurology

## 2021-05-04 ENCOUNTER — Ambulatory Visit: Payer: Medicare Other | Admitting: Neurology

## 2021-05-29 ENCOUNTER — Emergency Department (HOSPITAL_BASED_OUTPATIENT_CLINIC_OR_DEPARTMENT_OTHER)
Admission: EM | Admit: 2021-05-29 | Discharge: 2021-05-29 | Disposition: A | Discharge status: Home / Self Care | Payer: No Typology Code available for payment source | Attending: Emergency Medicine | Admitting: Emergency Medicine

## 2021-05-29 ENCOUNTER — Encounter (HOSPITAL_BASED_OUTPATIENT_CLINIC_OR_DEPARTMENT_OTHER): Payer: Self-pay | Admitting: Emergency Medicine

## 2021-05-29 ENCOUNTER — Other Ambulatory Visit: Payer: Self-pay

## 2021-05-29 ENCOUNTER — Emergency Department (HOSPITAL_BASED_OUTPATIENT_CLINIC_OR_DEPARTMENT_OTHER): Payer: No Typology Code available for payment source

## 2021-05-29 DIAGNOSIS — Y92009 Unspecified place in unspecified non-institutional (private) residence as the place of occurrence of the external cause: Secondary | ICD-10-CM | POA: Diagnosis not present

## 2021-05-29 DIAGNOSIS — I2510 Atherosclerotic heart disease of native coronary artery without angina pectoris: Secondary | ICD-10-CM | POA: Insufficient documentation

## 2021-05-29 DIAGNOSIS — S20212A Contusion of left front wall of thorax, initial encounter: Secondary | ICD-10-CM | POA: Diagnosis not present

## 2021-05-29 DIAGNOSIS — Z87442 Personal history of urinary calculi: Secondary | ICD-10-CM | POA: Insufficient documentation

## 2021-05-29 DIAGNOSIS — E039 Hypothyroidism, unspecified: Secondary | ICD-10-CM | POA: Insufficient documentation

## 2021-05-29 DIAGNOSIS — Z79899 Other long term (current) drug therapy: Secondary | ICD-10-CM | POA: Diagnosis not present

## 2021-05-29 DIAGNOSIS — Z7901 Long term (current) use of anticoagulants: Secondary | ICD-10-CM | POA: Diagnosis not present

## 2021-05-29 DIAGNOSIS — W010XXA Fall on same level from slipping, tripping and stumbling without subsequent striking against object, initial encounter: Secondary | ICD-10-CM | POA: Insufficient documentation

## 2021-05-29 DIAGNOSIS — J449 Chronic obstructive pulmonary disease, unspecified: Secondary | ICD-10-CM | POA: Insufficient documentation

## 2021-05-29 DIAGNOSIS — Z7982 Long term (current) use of aspirin: Secondary | ICD-10-CM | POA: Diagnosis not present

## 2021-05-29 DIAGNOSIS — Z794 Long term (current) use of insulin: Secondary | ICD-10-CM | POA: Insufficient documentation

## 2021-05-29 DIAGNOSIS — E1140 Type 2 diabetes mellitus with diabetic neuropathy, unspecified: Secondary | ICD-10-CM | POA: Insufficient documentation

## 2021-05-29 DIAGNOSIS — I10 Essential (primary) hypertension: Secondary | ICD-10-CM | POA: Diagnosis not present

## 2021-05-29 DIAGNOSIS — S29001A Unspecified injury of muscle and tendon of front wall of thorax, initial encounter: Secondary | ICD-10-CM | POA: Diagnosis present

## 2021-05-29 MED ORDER — ACETAMINOPHEN 325 MG PO TABS
650.0000 mg | ORAL_TABLET | Freq: Once | ORAL | Status: AC
  Administered 2021-05-29: 650 mg via ORAL
  Filled 2021-05-29: qty 2

## 2021-05-29 MED ORDER — LIDOCAINE 5 % EX PTCH: 1.0000 | MEDICATED_PATCH | Freq: Every day | CUTANEOUS | 0 refills | Status: DC | PRN

## 2021-05-29 MED ORDER — LIDOCAINE 5 % EX PTCH
1.0000 | MEDICATED_PATCH | CUTANEOUS | Status: DC
  Administered 2021-05-29: 1 via TRANSDERMAL
  Filled 2021-05-29: qty 1

## 2021-05-29 MED ORDER — METHOCARBAMOL 500 MG PO TABS: 1000.0000 mg | ORAL_TABLET | Freq: Two times a day (BID) | ORAL | 0 refills | Status: AC

## 2021-05-29 MED ORDER — ACETAMINOPHEN 325 MG PO TABS: 650.0000 mg | ORAL_TABLET | Freq: Four times a day (QID) | ORAL | 0 refills | Status: DC | PRN

## 2021-05-29 NOTE — Discharge Instructions (Addendum)
It was a pleasure caring for you today in the emergency department. ° °Please return to the emergency department for any worsening or worrisome symptoms. ° ° °

## 2021-05-29 NOTE — ED Notes (Signed)
RT Note: Pt. seen s/p fall, (+) hx. smoking along with COPD, Incentive Spirometry ordered/given with instruction on use/importance and possible complications. ?

## 2021-05-29 NOTE — ED Triage Notes (Signed)
Patient reports tripping over a toy at home 2 days ago and hit left side on a rocking chair. C/o pain to left side pain and painful to take deep breaths.  ?

## 2021-05-29 NOTE — ED Provider Notes (Signed)
?[facility] ?Provider Note ? ? ?CSN: [id number] ?Arrival date & time: 05/29/21  1051 ? ?  ? ?History ? ?Chief Complaint  ?Patient presents with  ? Fall  ?  Left rib cage pain  ? ? ?Vincent [name] is a 56 y.o. male. ? ?This is a 56 y.o. male with significant medical history as below, including hypertension, hyperlipidemia, OSA, PTSD who presents to the ED with complaint of left-sided rib injury.  Patient reports that 2 days ago he fell over a grand child 's toy, fell onto a rocking chair left axilla.  No LOC, was ambulatory following the event.  No blood thinners.  He has been having some left-sided chest wall pain since the fall.  Pain with deep inspiration, torso twisting.  No coughing or dyspnea.  No nausea, vomiting, abdominal pain, no headaches or vision changes.  No change in bowel or bladder function.  No change to oral intake or daily medications.  No gait disturbance. ? ? ? ? ? ? ?Past Medical History: ?No date: Anxiety ?No date: Benign essential tremor ?No date: Bipolar 2 disorder ([place]) ?    Comment:  followed by [facility]--- dr s. Adele Schilder ?No date: Bladder cancer [facility]) ?    Comment:  recurrent ?No date: CAD (coronary artery disease) ?    Comment:  cardiac cath 2003  and 2011 both showed normal coronary  ?             arteries w/ preserved lvf;  Non obstructive on CTA Oct  ?             2019.  ?No date: Chronic pain syndrome ?    Comment:  back---- [facility] [place] ?No date: Cold extremities ?    Comment:  BLE ?No date: COPD (chronic obstructive pulmonary disease) ([place]) ?No date: DDD (degenerative disc disease), lumbar ?No date: Diabetic peripheral neuropathy ([place]) ?No date: Gastroparesis ?    Comment:  followed by dr Henrene Pastor ?No date: GERD (gastroesophageal reflux disease) ?No date: Hiatal hernia ?urologist-  previously dr Consuella Lose;  now dr gay: History of bladder  ?cancer ?    Comment:  papillay TCC (Ta G1)  s/p TURBT and chemo instillation  ?              2014 ?12/2017: History of chest pain ?    Comment:  heart cath normal ?05/27/2015: History of encephalopathy ?    Comment:  admission w/ acute encephalopathy thought to be  ?             secondary to pain meds and COPD ?No date: History of gastric ulcer ?No date: History of Helicobacter pylori infection ?No date: History of kidney stones ?2008  and 10-19-2018: History of TIA (transient ischemic attack) ?    Comment:   no residual's ?2010: History of traumatic head injury ?    Comment:  w/ LOC  per pt needed stitches, hit in head with a mower ?             blade ?No date: Hyperlipidemia ?No date: Hypertension ?No date: Hypogonadism male ?    Comment:  s/p  bilateral orchiectomy ?No date: Hypothyroidism ?No date: Insomnia ?No date: Mild obstructive sleep apnea ?    Comment:  study in epic 12-04-2016, no cpap ?No date: PTSD (post-traumatic stress disorder) ?    Comment:  chronic ?No date: PTSD (post-traumatic stress disorder) ?No date: RA (rheumatoid arthritis) ([place]) ?  Comment:  followed by [facility] ?neurologist-  dr Krista Blue--  differential dx complex partial seizure .vs.   ?mood disorder .vs.  pseudoseizure--  negative EEG's: Seizures,  ?transient ([place]) ?    Comment:  confusion episodes and staring spells since 11/ 2015    ?             (03-26-2020 per pt wife last seizure 10 /2021) ?NEUOROLOGIST-  DR Krista Blue: Transient confusion ?    Comment:  Episodes since 11/ 2015--  neurologist dx  differential  ?             complex partial seizure  .vs. mood disorder . vs.  ?             pseudoseizure ?No date: Type 2 diabetes mellitus treated with insulin ([place]) ?    Comment:  endocrinologist--- dr Loanne Drilling---  (03-26-2020 pt does  ?             not check blood sugar at home) ? ?Past Surgical History: ?04/28/2020: AMPUTATION; Left ?    Comment:  Procedure: LEFT LITTLE FINGER AMPUTATION;  Surgeon:  ?             Newt Minion, MD;  Location: [place];  Service:  ?             Orthopedics;  Laterality: Left; ?12-27-2001  DR  Einar Gip  &  05-26-2009  DR Irish Lack: CARDIAC  ?CATHETERIZATION ?    Comment:  RESULTS FOR BOTH ARE NORMAL CORONARIES AND PERSERVED  ?             LVF/ EF 60% ?right 09-16-2003;  left ?: CARPAL TUNNEL RELEASE; Bilateral ?02/25/2015: CARPAL TUNNEL RELEASE; Left ?    Comment:  Procedure: LEFT CARPAL TUNNEL RELEASE;  Surgeon: Lennette Bihari  ?             Fredna Dow, MD;  Location: [place];   ?             Service: Orthopedics;  Laterality: Left; ?10/10/2012: CYSTOSCOPY; N/A ?    Comment:  Procedure: CYSTOSCOPY CLOT EVACUATION FULGERATION OF  ?             BLEEDERS ;  Surgeon: Claybon Jabs, MD;  Location:  ?             [place];  Service: Urology;   ?             Laterality: N/A; ?11/26/2015: CYSTOSCOPY WITH BIOPSY; N/A ?    Comment:  Procedure: CYSTOSCOPY WITH BIOPSY AND FULGURATION;   ?             Surgeon: Kathie Rhodes, MD;  Location: [facility];  Service: Urology;  Laterality: N/A; ?2014: ESOPHAGOGASTRODUODENOSCOPY ?11-17-2010: LAPAROSCOPIC CHOLECYSTECTOMY ?02/21/2016: ORCHIECTOMY; Right ?    Comment:  Procedure: SCROTAL ORCHIECTOMY with TESTICULAR  ?             PROSTHESIS IMPLANT;  Surgeon: Kathie Rhodes, MD;   ?             Location: [facility];  Service: Urology;  ?             Laterality: Right; ?09/02/2018: ORCHIECTOMY; Left ?    Comment:  Procedure: ORCHIECTOMY;  Surgeon: Kathie Rhodes, MD;   ?             Location: [place];  Service: Urology;  ?             Laterality: Left; ?12/2004: ROTATOR CUFF REPAIR; Right ?08/09/2012: TRANSURETHRAL RESECTION OF BLADDER TUMOR; N/A ?    Comment:  Procedure: TRANSURETHRAL RESECTION OF BLADDER TUMOR  ?             (TURBT) WITH GYRUS WITH MITOMYCIN C;  Surgeon: Elta Guadeloupe C  ?             Karsten Ro, MD;  Location: [facility];   ?             Service: Urology;  Laterality: N/A; ?03/29/2020: TRANSURETHRAL RESECTION OF BLADDER TUMOR; N/A ?    Comment:  Procedure: TRANSURETHRAL RESECTION  OF BLADDER TUMOR  ?             (TURBT) and post-op instillation of gemcitabine;   ?             Surgeon: Janith Lima, MD;  Location: [place] [facility]             [facility];  Service: Urology;  Laterality: N/A; ?02/27/2014: TRANSURETHRAL RESECTION OF BLADDER TUMOR WITH GYRUS  ?(TURBT-GYRUS); N/A ?    Comment:  Procedure: TRANSURETHRAL RESECTION OF BLADDER TUMOR WITH ?             GYRUS (TURBT-GYRUS);  Surgeon: Claybon Jabs, MD;   ?             Location: [facility];  Service: Urology;  ?             Laterality: N/A;  ? ? ?The history is provided by the patient. No language interpreter was used.  ?Fall ?Pertinent negatives include no abdominal pain, no headaches and no shortness of breath.  ? ?  ? ?Home Medications ?Prior to Admission medications   ?Medication Sig Start Date End Date Taking? Authorizing Provider  ?acetaminophen (TYLENOL) 325 MG tablet Take 2 tablets (650 mg total) by mouth every 6 (six) hours as needed. 05/29/21  Yes Wynona Dove A, DO  ?lidocaine (LIDODERM) 5 % Place 1 patch onto the skin daily as needed. Remove & Discard patch within 12 hours or as directed by MD 05/29/21  Yes Jeanell Sparrow, DO  ?methocarbamol (ROBAXIN) 500 MG tablet Take 2 tablets (1,000 mg total) by mouth 2 (two) times daily for 5 days. 05/29/21 06/03/21 Yes Wynona Dove A, DO  ?aspirin EC 81 MG EC tablet Take 1 tablet (81 mg total) by mouth daily. Swallow whole. 11/16/20   Patrecia Pour, MD  ?clopidogrel (PLAVIX) 75 MG tablet Take 1 tablet (75 mg total) by mouth daily. 07/28/20   Marcial Pacas, MD  ?Continuous Blood Gluc Sensor (FREESTYLE LIBRE 2 SENSOR) MISC 1 Device by Does not apply route every 14 (fourteen) days. ?Patient taking differently: 1 Device by Does not apply route every 14 (fourteen) days. Has not received- waiting on Insurance 02/25/20   Renato Shin, MD  ?Dulaglutide (TRULICITY) 4.5 ZO/1.0RU SOPN Inject 4.5 mg into the skin once a week. ?Patient not taking: Reported on 03/31/2021 01/22/20   Renato Shin, MD  ?fenofibrate 160 MG tablet Take 160 mg by mouth daily. 12/31/19   [provider]  ?insulin glargine, 2 Unit Dial, (TOUJEO MAX SOLOSTAR) 300 UNIT/ML Solostar Pen Inject 180 Units into the s

## 2021-06-06 ENCOUNTER — Emergency Department (HOSPITAL_COMMUNITY): Payer: No Typology Code available for payment source

## 2021-06-06 ENCOUNTER — Other Ambulatory Visit: Payer: Self-pay

## 2021-06-06 ENCOUNTER — Emergency Department (HOSPITAL_BASED_OUTPATIENT_CLINIC_OR_DEPARTMENT_OTHER): Payer: No Typology Code available for payment source

## 2021-06-06 ENCOUNTER — Emergency Department (HOSPITAL_BASED_OUTPATIENT_CLINIC_OR_DEPARTMENT_OTHER): Payer: No Typology Code available for payment source | Admitting: Radiology

## 2021-06-06 ENCOUNTER — Emergency Department (HOSPITAL_BASED_OUTPATIENT_CLINIC_OR_DEPARTMENT_OTHER)
Admission: EM | Admit: 2021-06-06 | Discharge: 2021-06-06 | Disposition: A | Discharge status: Home / Self Care | Payer: No Typology Code available for payment source | Attending: Emergency Medicine | Admitting: Emergency Medicine

## 2021-06-06 ENCOUNTER — Encounter (HOSPITAL_BASED_OUTPATIENT_CLINIC_OR_DEPARTMENT_OTHER): Payer: Self-pay | Admitting: Emergency Medicine

## 2021-06-06 DIAGNOSIS — Z79899 Other long term (current) drug therapy: Secondary | ICD-10-CM | POA: Insufficient documentation

## 2021-06-06 DIAGNOSIS — E1165 Type 2 diabetes mellitus with hyperglycemia: Secondary | ICD-10-CM | POA: Diagnosis not present

## 2021-06-06 DIAGNOSIS — J449 Chronic obstructive pulmonary disease, unspecified: Secondary | ICD-10-CM | POA: Insufficient documentation

## 2021-06-06 DIAGNOSIS — R202 Paresthesia of skin: Secondary | ICD-10-CM | POA: Diagnosis not present

## 2021-06-06 DIAGNOSIS — Z7982 Long term (current) use of aspirin: Secondary | ICD-10-CM | POA: Insufficient documentation

## 2021-06-06 DIAGNOSIS — Z7902 Long term (current) use of antithrombotics/antiplatelets: Secondary | ICD-10-CM | POA: Diagnosis not present

## 2021-06-06 DIAGNOSIS — F17200 Nicotine dependence, unspecified, uncomplicated: Secondary | ICD-10-CM | POA: Insufficient documentation

## 2021-06-06 DIAGNOSIS — I2510 Atherosclerotic heart disease of native coronary artery without angina pectoris: Secondary | ICD-10-CM | POA: Diagnosis not present

## 2021-06-06 DIAGNOSIS — R29898 Other symptoms and signs involving the musculoskeletal system: Secondary | ICD-10-CM

## 2021-06-06 DIAGNOSIS — R079 Chest pain, unspecified: Secondary | ICD-10-CM | POA: Insufficient documentation

## 2021-06-06 DIAGNOSIS — Z794 Long term (current) use of insulin: Secondary | ICD-10-CM | POA: Insufficient documentation

## 2021-06-06 DIAGNOSIS — R7989 Other specified abnormal findings of blood chemistry: Secondary | ICD-10-CM | POA: Insufficient documentation

## 2021-06-06 DIAGNOSIS — R739 Hyperglycemia, unspecified: Secondary | ICD-10-CM

## 2021-06-06 DIAGNOSIS — Z7984 Long term (current) use of oral hypoglycemic drugs: Secondary | ICD-10-CM | POA: Insufficient documentation

## 2021-06-06 DIAGNOSIS — I10 Essential (primary) hypertension: Secondary | ICD-10-CM | POA: Insufficient documentation

## 2021-06-06 DIAGNOSIS — R531 Weakness: Secondary | ICD-10-CM | POA: Diagnosis present

## 2021-06-06 DIAGNOSIS — Z8551 Personal history of malignant neoplasm of bladder: Secondary | ICD-10-CM | POA: Insufficient documentation

## 2021-06-06 LAB — BASIC METABOLIC PANEL
Anion gap: 10 (ref 5–15)
BUN: 26 mg/dL — ABNORMAL HIGH (ref 6–20)
CO2: 25 mmol/L (ref 22–32)
Calcium: 9.5 mg/dL (ref 8.9–10.3)
Chloride: 97 mmol/L — ABNORMAL LOW (ref 98–111)
Creatinine, Ser: 1.51 mg/dL — ABNORMAL HIGH (ref 0.61–1.24)
GFR, Estimated: 55 mL/min — ABNORMAL LOW (ref >60)
Glucose, Bld: 443 mg/dL — ABNORMAL HIGH (ref 70–99)
Potassium: 4.4 mmol/L (ref 3.5–5.1)
Sodium: 132 mmol/L — ABNORMAL LOW (ref 135–145)

## 2021-06-06 LAB — CBG MONITORING, ED
Glucose-Capillary: 130 mg/dL — ABNORMAL HIGH (ref 70–99)
Glucose-Capillary: 99 mg/dL (ref 70–99)

## 2021-06-06 LAB — CBC
HCT: 43 % (ref 39.0–52.0)
Hemoglobin: 14.1 g/dL (ref 13.0–17.0)
MCH: 26.8 pg (ref 26.0–34.0)
MCHC: 32.8 g/dL (ref 30.0–36.0)
MCV: 81.6 fL (ref 80.0–100.0)
Platelets: 260 10*3/uL (ref 150–400)
RBC: 5.27 MIL/uL (ref 4.22–5.81)
RDW: 12.5 % (ref 11.5–15.5)
WBC: 5.9 10*3/uL (ref 4.0–10.5)
nRBC: 0 % (ref 0.0–0.2)

## 2021-06-06 LAB — TROPONIN I (HIGH SENSITIVITY)
Troponin I (High Sensitivity): <2 ng/L (ref <18)
Troponin I (High Sensitivity): <2 ng/L (ref <18)

## 2021-06-06 MED ORDER — LACTATED RINGERS IV BOLUS
1000.0000 mL | Freq: Once | INTRAVENOUS | Status: AC
  Administered 2021-06-06: 1000 mL via INTRAVENOUS

## 2021-06-06 MED ORDER — PREDNISONE 20 MG PO TABS
60.0000 mg | ORAL_TABLET | Freq: Once | ORAL | Status: AC
Start: 2021-06-06 — End: 2021-06-06
  Administered 2021-06-06: 60 mg via ORAL
  Filled 2021-06-06: qty 3

## 2021-06-06 MED ORDER — PREDNISONE 20 MG PO TABS: ORAL_TABLET | ORAL | 0 refills | Status: DC

## 2021-06-06 NOTE — ED Provider Triage Note (Signed)
Emergency Medicine Provider Triage Evaluation Note ? ?[name] , a 56 y.o. male  was evaluated in triage.  Pt complains of right arm weakness.  [facility] was seen [facility] and [facility] for MRI of head and neck for further evaluation of his symptoms.  Patient states that upon arriving in the emergency department he felt dizzy like his blood sugar was low.  CBG was checked and found to be 99.  Patient reports that this is "very low for him." ? ?Review of Systems  ?Positive: See previous provider note ?Negative: See previous provider note ? ?Physical Exam  ?BP 121/80   Pulse 70   Temp 97.8 ?F (36.6 ?C) (Oral)   Resp 13   SpO2 100%  ?Gen:   Awake, no distress   ?Resp:  Normal effort  ?MSK:   Right arm weakness. ?Other:   ? ?Medical Decision Making  ?Medically screening exam initiated at 3:30 PM.  Appropriate orders placed.  Prudencio Velazco Philipp was informed that the remainder of the evaluation will be completed by another provider, this initial triage assessment does not replace that evaluation, and the importance of remaining in the ED until their evaluation is complete. ? ?Per chart review patient's blood sugar average was 443.  He received 1 L fluid bolus.  CBG is now 99.  Will allow patient p.o. intake to bring up his sugar. ?  ?Loni Beckwith, PA-C ?06/06/21 1533 ? ?

## 2021-06-06 NOTE — ED Triage Notes (Signed)
Pt reports right arm "numbness" since Saturday with impaired movement.  Pt is able to feel pressure with light palpation and reports pain to majority of right arm with deeper palpation.  Right grip weaker than left, Pt able to ambulate and speak without difficulty, facial symmetry +, PERRL, no other focal deficits noted in triage.  ?

## 2021-06-06 NOTE — Discharge Instructions (Addendum)
It was our pleasure to provide your ER care today - we hope that you feel better. ? ?Take prednisone as prescribed. Make sure to drink plenty of water/fluids/stay well hydrated, and follow diabetes meal plan, and take diabetes meds as prescribed, and monitor blood sugars, when taking this medication.  ? ?Avoid lying on right arm/side, especially for prolonged periods.  Use your right arm was much as possible and discuss with your doctor arranging physical therapy when you call tomorrow AM.  ? ?Follow up closely with primary care doctor, and with neurologist, this week -  call office tomorrow morning to arrange appointment.  ? ?Return to ER if worse, new symptoms, fevers, severe arm swelling or arm pain, any new numbness or weakness, any new change in speech or vision, or other concern.  ?

## 2021-06-06 NOTE — ED Notes (Signed)
IV left in placed and wrapped. Pt was given all necessary paperwork and sent over to [facility] via POV.  ?

## 2021-06-06 NOTE — ED Provider Notes (Signed)
?[facility] EM[facility] DEPT ?Provider Note ? ? ?CSN: [id number] ?Arrival date & time: 06/06/21  1040 ? ?  ? ?History ? ?Chief Complaint  ?Patient presents with  ? Chest Pain  ? ? ?Vincent [name] is a 56 y.o. male. ? ?Pt with hx of CAD, hypertension, hyperlipidemia, RA, diabetes, TIA, stroke, COPD, smoker, bipolar disorder, bladder cancer, seizure on Lamictal, recurrent focal deficits due to recurrent hyperglycemia but no acute stroke with stable 50% ICA stenosis as off 2022 who is presenting today with right arm weakness.  Also pt reports chest pain this morning.  Patient reports that over the last 2 days he had noticed some tingling and numbness in his right upper extremity.  This morning he got up to go to the refrigerator to get something out of the refrigerator and he realized that his right arm was weak to the point he could not lift it or get into the fridge.  He denies any pain in his neck or shoulder.  He just reports it feels numb and he is having a hard time lifting it.  He denies any issues with his leg, face or speech.  His left side feels fine.  Also patient reports at 3 AM this morning when he was getting up to go to the bathroom.  On the way to the bathroom he started having a pressure in his chest that did not have associated symptoms but felt like made his breathing better and lasted about 30 minutes and resolved spontaneously.  He denies any further episodes of pain and denies any pain currently.  He has had no recent medication changes and reports in the past he has had TIAs but this feels different. ? ?The history is provided by the patient and medical records.  ?Chest Pain ? ?  ? ?Home Medications ?Prior to Admission medications   ?Medication Sig Start Date End Date Taking? Authorizing Provider  ?acetaminophen (TYLENOL) 325 MG tablet Take 2 tablets (650 mg total) by mouth every 6 (six) hours as needed. 05/29/21   Jeanell Sparrow, DO  ?aspirin EC 81 MG EC tablet Take 1 tablet (81 mg  total) by mouth daily. Swallow whole. 11/16/20   Patrecia Pour, MD  ?clopidogrel (PLAVIX) 75 MG tablet Take 1 tablet (75 mg total) by mouth daily. 07/28/20   Marcial Pacas, MD  ?Continuous Blood Gluc Sensor (FREESTYLE LIBRE 2 SENSOR) MISC 1 Device by Does not apply route every 14 (fourteen) days. ?Patient taking differently: 1 Device by Does not apply route every 14 (fourteen) days. Has not received- waiting on Insurance 02/25/20   Renato Shin, MD  ?Dulaglutide (TRULICITY) 4.5 HM/0.9OB SOPN Inject 4.5 mg into the skin once a week. ?Patient not taking: Reported on 03/31/2021 01/22/20   Renato Shin, MD  ?fenofibrate 160 MG tablet Take 160 mg by mouth daily. 12/31/19   [provider]  ?insulin glargine, 2 Unit Dial, (TOUJEO MAX SOLOSTAR) 300 UNIT/ML Solostar Pen Inject 180 Units into the skin every morning. And pen needles 1/day ?Patient taking differently: Inject 180 Units into the skin in the morning. 02/25/20   Renato Shin, MD  ?lamoTRIgine (LAMICTAL) 150 MG tablet Take 1 tablet (150 mg total) by mouth 2 (two) times daily. 07/28/20   Marcial Pacas, MD  ?levothyroxine (SYNTHROID, LEVOTHROID) 25 MCG tablet Take 25 mcg by mouth daily before breakfast.  ?Patient not taking: Reported on 03/31/2021    [provider]  ?lidocaine (LIDODERM) 5 % Place 1 patch onto the skin daily  as needed. Remove & Discard patch within 12 hours or as directed by MD 05/29/21   Jeanell Sparrow, DO  ?Lurasidone HCl (LATUDA) 60 MG TABS Take 1 tablet (60 mg total) by mouth daily with breakfast. 03/31/21   Arfeen, Arlyce Harman, MD  ?metFORMIN (GLUCOPHAGE-XR) 500 MG 24 hr tablet Take 500 mg by mouth 2 (two) times daily. 08/24/18   [provider]  ?morphine (MS CONTIN) 60 MG 12 hr tablet Take 60 mg by mouth 2 (two) times daily. Every 12 hours 06/12/19   [provider]  ?morphine (MSIR) 15 MG tablet Take 15 mg by mouth 2 (two) times daily as needed. For breakthrough pain  (03-26-2020 per pt wife pt takes this twice also 06/11/19    [provider]  ?omeprazole (PRILOSEC) 40 MG capsule Take 40 mg by mouth daily.    [provider]  ?pregabalin (LYRICA) 225 MG capsule Take 1 capsule by mouth in the morning 03/01/21   Melvenia Beam, MD  ?rosuvastatin (CRESTOR) 10 MG tablet Take 1 tablet (10 mg total) by mouth daily. 11/16/20   Patrecia Pour, MD  ?tamsulosin (FLOMAX) 0.4 MG CAPS capsule Take 0.8 mg by mouth at bedtime. ?Patient not taking: Reported on 03/31/2021    [provider]  ?traZODone (DESYREL) 100 MG tablet Take 1 tablet (100 mg total) by mouth at bedtime as needed for sleep. 03/31/21   Arfeen, Arlyce Harman, MD  ?venlafaxine XR (EFFEXOR-XR) 150 MG 24 hr capsule TAKE 1 CAPSULE BY MOUTH ONCE DAILY WITH BREAKFAST 01/31/21   Marcial Pacas, MD  ?Vitamin D, Ergocalciferol, (DRISDOL) 1.25 MG (50000 UNIT) CAPS capsule Take 50,000 Units by mouth every Wednesday. ?Patient not taking: Reported on 03/31/2021 12/26/19   [provider]  ?   ? ?Allergies    ?Celebrex [celecoxib], Hydrocodone, and Sulfa antibiotics   ? ?Review of Systems   ?Review of Systems  ?Cardiovascular:  Positive for chest pain.  ? ?Physical Exam ?Updated Vital Signs ?BP 121/80   Pulse 70   Temp 97.8 ?F (36.6 ?C) (Oral)   Resp 13   SpO2 100%  ?Physical Exam ?Vitals and nursing note reviewed.  ?Constitutional:   ?   General: He is not in acute distress. ?   Appearance: He is well-developed.  ?HENT:  ?   Head: Normocephalic and atraumatic.  ?Eyes:  ?   Conjunctiva/sclera: Conjunctivae normal.  ?   Pupils: Pupils are equal, round, and reactive to light.  ?Cardiovascular:  ?   Rate and Rhythm: Normal rate and regular rhythm.  ?   Pulses: Normal pulses.  ?   Heart sounds: No murmur heard. ?Pulmonary:  ?   Effort: Pulmonary effort is normal. No respiratory distress.  ?   Breath sounds: Normal breath sounds. No wheezing or rales.  ?Abdominal:  ?   General: There is no distension.  ?   Palpations: Abdomen is soft.  ?   Tenderness: There is no abdominal  tenderness. There is no guarding or rebound.  ?Musculoskeletal:     ?   General: No tenderness. Normal range of motion.  ?   Cervical back: Normal range of motion and neck supple. No tenderness. No spinous process tenderness or muscular tenderness.  ?   Right lower leg: No edema.  ?   Left lower leg: No edema.  ?   Comments: Full passive ROM of the right upper ext without pain.  ?Skin: ?   General: Skin is warm and dry.  ?  Findings: No erythema or rash.  ?Neurological:  ?   Mental Status: He is alert and oriented to person, place, and time.  ?   Comments: 3/5 strength in the right upper ext and decreased sensation.    Pt is able to lift the right arm to about 40 degrees and then is unable to lift further.  When passively raised he cannot keep it up above his head.  Weak hand grip compared to left.  5/5 strength is bilateral lower ext and left arm.  No facial droop or numbness to the face.  ?Psychiatric:     ?   Behavior: Behavior normal.  ? ? ?ED Results / Procedures / Treatments   ?Labs ?(all labs ordered are listed, but only abnormal results are displayed) ?Labs Reviewed  ?BASIC METABOLIC PANEL - Abnormal; Notable for the following components:  ?    Result Value  ? Sodium 132 (*)   ? Chloride 97 (*)   ? Glucose, Bld 443 (*)   ? BUN 26 (*)   ? Creatinine, Ser 1.51 (*)   ? GFR, Estimated 55 (*)   ? All other components within normal limits  ?CBC  ?TROPONIN I (HIGH SENSITIVITY)  ?TROPONIN I (HIGH SENSITIVITY)  ? ? ?EKG ?EKG Interpretation ? ?Date/Time:  Monday June 06 2021 10:51:49 EDT ?Ventricular Rate:  78 ?PR Interval:  158 ?QRS Duration: 88 ?QT Interval:  384 ?QTC Calculation: 437 ?R Axis:   14 ?Text Interpretation: Normal sinus rhythm Cannot rule out Anterior infarct , age undetermined T wave abnormality, consider lateral ischemia No significant change since last tracing When compared with ECG of 14-Nov-2020 14:11, PREVIOUS ECG IS PRESENT Confirmed by Blanchie Dessert [phone]) on 06/06/2021 12:32:03  PM ? ?Radiology ?DG Chest 2 View ? ?Result Date: 06/06/2021 ?CLINICAL DATA:  Chest tightness with right arm numbness. History of TIA. EXAM: CHEST - 2 VIEW COMPARISON:  Radiographs 05/29/2021 and 12/10/2018.  CT 06/10/2020.

## 2021-06-06 NOTE — ED Notes (Signed)
Pt complained of pain in his right shoulder, while manipulating his right arm. No obvious injury to note to the shoulder. He has a palpable pulse in the wrist, Slight feeling to the right arm but little to no ability to move the right arm.  ?

## 2021-06-06 NOTE — ED Notes (Signed)
Last seen normal and felt his normal was 06/04/21 morning. ?

## 2021-06-06 NOTE — ED Provider Notes (Signed)
Patient was evaluated in ED today by Dr Maryan Rued (please refer to her complete note/H and P), and sent here  for MRI as no MRI at her facility.  ? ?Pt c/o RUE numbness/tingling/weakness in past two days - awoke with Saturday AM, constant since, without acute worsening today. No leg numbness/weakness. No change in speech or vision. No arm pain or swelling. No fever or chills. No headache. Does not recall sleeping or lying on arm in awkward position or for prolonged period.  ? ?MRI neg for cva or severe/acute disc.  Degenerative changes noted.  ? ?Will try course of prednisone, and rec close neurology f/u this week.  ? ?Return precautions provided.  ? ? ?  ?Lajean Saver, MD ?06/06/21 1916 ? ?

## 2021-06-06 NOTE — ED Notes (Signed)
Food and drink provided to patient per triage PA. ?

## 2021-06-14 ENCOUNTER — Ambulatory Visit (INDEPENDENT_AMBULATORY_CARE_PROVIDER_SITE_OTHER): Payer: No Typology Code available for payment source | Admitting: Orthopedic Surgery

## 2021-06-14 ENCOUNTER — Ambulatory Visit (INDEPENDENT_AMBULATORY_CARE_PROVIDER_SITE_OTHER): Payer: No Typology Code available for payment source

## 2021-06-14 DIAGNOSIS — M898X1 Other specified disorders of bone, shoulder: Secondary | ICD-10-CM

## 2021-06-14 DIAGNOSIS — M25511 Pain in right shoulder: Secondary | ICD-10-CM

## 2021-06-15 ENCOUNTER — Encounter: Payer: Self-pay | Admitting: Orthopedic Surgery

## 2021-06-15 NOTE — Progress Notes (Signed)
? ?Office Visit Note ?  ?Patient: Vincent [name]           ?Date of Birth: May 23, 1965           ?MRN: [id number] ?Visit Date: 06/14/2021 ?             ?Requested by: Hayden Rasmussen, MD ?[address] ?[address] ?[place],  [place] [address] ?PCP: Hayden Rasmussen, MD ? ?Chief Complaint  ?Patient presents with  ? Right Shoulder - Pain  ? ? ? ? ?HPI: ?Patient is a 56 year old gentleman who presents complaining of increasing weakness decreasing range of motion of the right shoulder.  Patient states his primary care physician did provide a subacromial injection which helped minimal.  Patient states he felt a pop in his shoulder while lifting at work about a week ago. ? ?Patient is status post arthroscopic debridement of the right shoulder about 5 years ago. ? ?Assessment & Plan: ?Visit Diagnoses:  ?1. Acute pain of right shoulder   ?2. Pain of right clavicle   ? ? ?Plan: We will obtain an MRI scan of the right shoulder and have him follow-up with Dr. Marlou Sa.  Patient may need rotator cuff reconstruction. ? ?Follow-Up Instructions: Return in about 2 weeks (around 06/28/2021).  ? ?Ortho Exam ? ?Patient is alert, oriented, no adenopathy, well-dressed, normal affect, normal respiratory effort. ?Examination patient has active abduction and flexion of the right shoulder of only 30 degrees passively I can get him up to 120 degrees.  Internal and external rotation is painful.  He does have some swelling over the sternoclavicular joint but there is no redness no bony prominence no tenderness to palpation. ? ?Imaging: ?No results found. ?No images are attached to the encounter. ? ?Labs: ?Lab Results  ?Component Value Date  ? HGBA1C 11.8 (H) 11/14/2020  ? HGBA1C 11.4 (H) 06/11/2020  ? HGBA1C 13.0 (A) 01/22/2020  ? ESRSEDRATE 2 04/24/2019  ? CRP 1 04/24/2019  ? REPTSTATUS 03/06/2014 FINAL 03/04/2014  ? CULT NO GROWTH ?Performed at [facility] ? 03/04/2014  ? ? ? ?Lab Results  ?Component Value Date  ? ALBUMIN 3.7 11/14/2020  ?  ALBUMIN 3.5 06/10/2020  ? ALBUMIN 4.5 06/05/2019  ? ? ?Lab Results  ?Component Value Date  ? MG 2.1 06/11/2020  ? MG 1.6 (L) 08/09/2019  ? MG 2.2 01/03/2018  ? ?Lab Results  ?Component Value Date  ? VD25OH 20.4 (L) 04/24/2019  ? VD25OH 22.5 (L) 11/22/2016  ? ? ?No results found for: PREALBUMIN ? ?  Latest Ref Rng & Units 06/06/2021  ? 11:58 AM 11/15/2020  ?  2:48 AM 11/14/2020  ?  1:57 PM  ?CBC EXTENDED  ?WBC 4.0 - 10.5 K/uL 5.9   5.4   7.3    ?RBC 4.22 - 5.81 MIL/uL 5.27   4.06   4.34    ?Hemoglobin 13.0 - 17.0 g/dL 14.1   11.4   12.3    ?HCT 39.0 - 52.0 % 43.0   33.7   36.4    ?Platelets 150 - 400 K/uL 260   181   205    ?NEUT# 1.7 - 7.7 K/uL  2.1   3.8    ?Lymph# 0.7 - 4.0 K/uL  2.6   2.8    ? ? ? ?There is no height or weight on file to calculate BMI. ? ?Orders:  ?Orders Placed This Encounter  ?Procedures  ? XR Shoulder Right  ? XR Clavicle Right  ? ?No orders  of the defined types were placed in this encounter. ? ? ? Procedures: ?No procedures performed ? ?Clinical Data: ?No additional findings. ? ?ROS: ? ?All other systems negative, except as noted in the HPI. ?Review of Systems ? ?Objective: ?Vital Signs: There were no vitals taken for this visit. ? ?Specialty Comments:  ?No specialty comments available. ? ?PMFS History: ?Patient Active Problem List  ? Diagnosis Date Noted  ? Cerebral thrombosis with cerebral infarction 11/16/2020  ? Stroke-like symptoms 11/14/2020  ? Hyperkalemia 11/14/2020  ? AKI (acute kidney injury) ([place]) 11/14/2020  ? Ischemic stroke ([place]) 06/10/2020  ? Hyperglycemia   ? Gangrene of finger of left hand ([place])   ? Pain due to onychomycosis of toenails of both feet 01/28/2020  ? Hyperglycemia due to type 2 diabetes mellitus ([place]) 01/07/2020  ? Resistance to insulin 01/07/2020  ? Right leg weakness 04/24/2019  ? Right leg pain 04/24/2019  ? Gait abnormality 04/24/2019  ? TIA (transient ischemic attack) 10/20/2018  ? Uncontrolled type 2 DM with hyperosmolar nonketotic hyperglycemia ([place]) 10/19/2018   ? Left sided numbness 10/19/2018  ? Diabetic autonomic neuropathy associated with type 2 diabetes mellitus ([place]) 02/01/2018  ? DM type 2 with diabetic peripheral neuropathy ([place]) 02/01/2018  ? Dyslipidemia 01/31/2018  ? Coronary artery disease involving native coronary artery of native heart without angina pectoris 01/31/2018  ? Chest pain 01/01/2018  ? Seizure disorder ([place]) 12/19/2016  ? Essential tremor 12/19/2016  ? Polypharmacy 12/19/2016  ? Chronic post-traumatic stress disorder (PTSD) 06/16/2016  ? Tremor 06/16/2016  ? Tobacco use disorder 05/05/2016  ? Psychophysiological insomnia 06/02/2015  ? Altered mental status 05/28/2015  ? Acute bronchitis 05/28/2015  ? Type 2 diabetes mellitus with hyperglycemia ([place]) 05/28/2015  ? Hypothyroidism 05/28/2015  ? Bipolar 2 disorder ([place]) 05/28/2015  ? History of rheumatoid arthritis 05/28/2015  ? Chronic pain 05/28/2015  ? Depression 03/18/2015  ? Carpal tunnel syndrome on left 02/10/2015  ? Carpal tunnel syndrome on right 02/10/2015  ? Diabetes ([place]) 01/23/2015  ? Obesity (BMI 30-39.9) 03/06/2014  ? Acute encephalopathy 03/05/2014  ? HLD (hyperlipidemia) 03/05/2014  ? Anemia, normocytic normochromic 03/05/2014  ? Altered mental state   ? Helicobacter pylori (H. pylori) infection 11/20/2012  ? Protein-calorie malnutrition, severe ([place]) 10/09/2012  ? Dysphagia 08/19/2012  ? Gastroparesis 08/19/2012  ? Bladder tumor 08/08/2012  ? Biliary dyskinesia 11/02/2010  ? ?Past Medical History:  ?Diagnosis Date  ? Anxiety   ? Benign essential tremor   ? Bipolar 2 disorder ([place])   ? followed by [facility]--- dr s. Adele Schilder  ? Bladder cancer ([place])   ? recurrent  ? CAD (coronary artery disease)   ? cardiac cath 2003  and 2011 both showed normal coronary arteries w/ preserved lvf;  Non obstructive on CTA Oct 2019.   ? Chronic pain syndrome   ? back---- [facility] [place]  ? Cold extremities   ? BLE  ? COPD (chronic obstructive pulmonary disease) ([place])   ? DDD (degenerative  disc disease), lumbar   ? Diabetic peripheral neuropathy ([place])   ? Gastroparesis   ? followed by dr Henrene Pastor  ? GERD (gastroesophageal reflux disease)   ? Hiatal hernia   ? History of bladder cancer urologist-  previously dr Consuella Lose;  now dr gay  ? papillay TCC (Ta G1)  s/p TURBT and chemo instillation 2014  ? History of chest pain 12/2017  ? heart cath normal  ? History of encephalopathy 05/27/2015  ? admission w/ acute encephalopathy  thought to be secondary to pain meds and COPD  ? History of gastric ulcer   ? History of Helicobacter pylori infection   ? History of kidney stones   ? History of TIA (transient ischemic attack) 2008  and 10-19-2018  ?  no residual's  ? History of traumatic head injury 2010  ? w/ LOC  per pt needed stitches, hit in head with a mower blade  ? Hyperlipidemia   ? Hypertension   ? Hypogonadism male   ? s/p  bilateral orchiectomy  ? Hypothyroidism   ? Insomnia   ? Mild obstructive sleep apnea   ? study in epic 12-04-2016, no cpap  ? PTSD (post-traumatic stress disorder)   ? chronic  ? PTSD (post-traumatic stress disorder)   ? RA (rheumatoid arthritis) ([place])   ? followed by [facility]  ? Seizures, transient [facility]) neurologist-  dr Krista Blue--  differential dx complex partial seizure .vs.  mood disorder .vs.  pseudoseizure--  negative EEG's  ? confusion episodes and staring spells since 11/ 2015   (03-26-2020 per pt wife last seizure 10 /2021)  ? Transient confusion NEUOROLOGIST-  DR Krista Blue  ? Episodes since 11/ 2015--  neurologist dx  differential complex partial seizure  .vs. mood disorder . vs. pseudoseizure  ? Type 2 diabetes mellitus treated with insulin ([place])   ? endocrinologist--- dr Loanne Drilling---  (03-26-2020 pt does not check blood sugar at home)  ?  ?Family History  ?Problem Relation Age of Onset  ? Diabetes Mother   ? Diabetes Father   ? Hypertension Father   ? Heart attack Father 69  ?     died age 32  ? Alcohol abuse Father   ? Colon cancer Neg Hx   ? Esophageal cancer Neg Hx   ?  Stomach cancer Neg Hx   ? Rectal cancer Neg Hx   ?  ?Past Surgical History:  ?Procedure Laterality Date  ? AMPUTATION Left 04/28/2020  ? Procedure: LEFT LITTLE FINGER AMPUTATION;  Surgeon: Meridee Score

## 2021-06-21 ENCOUNTER — Telehealth: Payer: Self-pay | Admitting: Orthopedic Surgery

## 2021-06-21 NOTE — Telephone Encounter (Signed)
I contacted pt and advised him we were waiting on information from [name] and pt states it is not WC it is to be billed with his insurance.  ?

## 2021-06-21 NOTE — Telephone Encounter (Signed)
Pt is calling to find out the status of the MRI, I advised that they are working on the [place] and you would be in touch with him  ?

## 2021-06-26 ENCOUNTER — Ambulatory Visit
Admission: RE | Admit: 2021-06-26 | Discharge: 2021-06-26 | Disposition: A | Discharge status: Home / Self Care | Payer: No Typology Code available for payment source | Source: Ambulatory Visit | Attending: Orthopedic Surgery | Admitting: Orthopedic Surgery

## 2021-06-26 DIAGNOSIS — M25511 Pain in right shoulder: Secondary | ICD-10-CM

## 2021-06-30 ENCOUNTER — Encounter (HOSPITAL_COMMUNITY): Payer: Self-pay | Admitting: Psychiatry

## 2021-06-30 ENCOUNTER — Telehealth (HOSPITAL_BASED_OUTPATIENT_CLINIC_OR_DEPARTMENT_OTHER): Payer: No Typology Code available for payment source | Admitting: Psychiatry

## 2021-06-30 DIAGNOSIS — F4312 Post-traumatic stress disorder, chronic: Secondary | ICD-10-CM

## 2021-06-30 DIAGNOSIS — F3181 Bipolar II disorder: Secondary | ICD-10-CM

## 2021-06-30 MED ORDER — LURASIDONE HCL 60 MG PO TABS: 60.0000 mg | ORAL_TABLET | Freq: Every day | ORAL | 2 refills | Status: DC

## 2021-06-30 MED ORDER — TRAZODONE HCL 100 MG PO TABS: 100.0000 mg | ORAL_TABLET | Freq: Every evening | ORAL | 2 refills | Status: DC | PRN

## 2021-06-30 NOTE — Progress Notes (Signed)
Virtual Visit via Telephone Note ? ?I connected with Tadao Emig Garinger on 06/30/21 at  8:40 AM EDT by telephone and verified that I am speaking with the correct person using two identifiers. ? ?Location: ?Patient: Home ?Provider: Home Office ?  ?I discussed the limitations, risks, security and privacy concerns of performing an evaluation and management service by telephone and the availability of in person appointments. I also discussed with the patient that there may be a patient responsible charge related to this service. The patient expressed understanding and agreed to proceed. ? ? ?History of Present Illness: ?Patient is evaluated by phone session.  On the last visit we have adjusted his medication.  We increased Latuda 60 mg because he continued to have irritability, depression, mood swings.  Since he is taking the medication on time he noticed improvement in his mood.  He is getting along with his wife and also walking outside with his 40-year-old grandchild.  He was also seen in the emergency room twice because of fall and chest pain.  Patient has a history of TIA and sometimes he gets very nervous when he feels dizzy or chest pain and wanted to check it out if he had a stroke.  He had neuroimaging and blood work done.  He was recommended to see neurologist and he has appointment coming up in few weeks.  Denies any crying spells, mania, psychosis or any anger.  He admitted chronic forgetfulness but his pillbox is now managed by his wife.  He is sleeping much better.  He is no longer taking Cymbalta.  However he is a still on Effexor prescribed by neurologist.  He is also taking Lamictal.  He has no tremors or shakes.  Like to keep his current medicine as he noticed improvement in his mood and sleep. ? ? ?Past Psychiatric History: Reviewed. ?H/O one brief hospitalization at [facility] due to suicidal thoughts.  No h/o suicidal attempt.  H/O anger, anxiety, PTSD and heavy substance use.  Claims to be sober  for while. Tried Depakote, Zoloft, lithium, Prozac and Xanax. [name] helped but stopped due to polypharmacy.  ?Recent Results (from the past 2160 hour(s))  ?Basic metabolic panel     Status: Abnormal  ? Collection Time: 06/06/21 11:58 AM  ?Result Value Ref Range  ? Sodium 132 (L) 135 - 145 mmol/L  ? Potassium 4.4 3.5 - 5.1 mmol/L  ? Chloride 97 (L) 98 - 111 mmol/L  ? CO2 25 22 - 32 mmol/L  ? Glucose, Bld 443 (H) 70 - 99 mg/dL  ?  Comment: Glucose reference range applies only to samples taken after fasting for at least 8 hours.  ? BUN 26 (H) 6 - 20 mg/dL  ? Creatinine, Ser 1.51 (H) 0.61 - 1.24 mg/dL  ? Calcium 9.5 8.9 - 10.3 mg/dL  ? GFR, Estimated 55 (L) >60 mL/min  ?  Comment: (NOTE) ?Calculated using the CKD-EPI Creatinine Equation (2021) ?  ? Anion gap 10 5 - 15  ?  Comment: Performed at [facility], [address], [place],  [address]  ?CBC     Status: None  ? Collection Time: 06/06/21 11:58 AM  ?Result Value Ref Range  ? WBC 5.9 4.0 - 10.5 K/uL  ? RBC 5.27 4.22 - 5.81 MIL/uL  ? Hemoglobin 14.1 13.0 - 17.0 g/dL  ? HCT 43.0 39.0 - 52.0 %  ? MCV 81.6 80.0 - 100.0 fL  ? MCH 26.8 26.0 - 34.0 pg  ? MCHC 32.8  30.0 - 36.0 g/dL  ? RDW 12.5 11.5 - 15.5 %  ? Platelets 260 150 - 400 K/uL  ? nRBC 0.0 0.0 - 0.2 %  ?  Comment: Performed at [facility], [address] [place], [place] [address]  ?Troponin I (High Sensitivity)     Status: None  ? Collection Time: 06/06/21 11:58 AM  ?Result Value Ref Range  ? Troponin I (High Sensitivity) <2 <18 ng/L  ?  Comment: (NOTE) ?Elevated high sensitivity troponin I (hsTnI) values and significant  ?changes across serial measurements may suggest ACS but many other  ?chronic and acute conditions are known to elevate hsTnI results.  ?Refer to the "Links" section for chest pain algorithms and additional  ?guidance. ?Performed at [facility], [address] ?[place], [place] [address] ?  ?Troponin I (High Sensitivity)      Status: None  ? Collection Time: 06/06/21 12:53 PM  ?Result Value Ref Range  ? Troponin I (High Sensitivity) <2 <18 ng/L  ?  Comment: (NOTE) ?Elevated high sensitivity troponin I (hsTnI) values and significant  ?changes across serial measurements may suggest ACS but many other  ?chronic and acute conditions are known to elevate hsTnI results.  ?Refer to the "Links" section for chest pain algorithms and additional  ?guidance. ?Performed at [facility], [address] ?[place], [place] [address] ?  ?CBG monitoring, ED     Status: Abnormal  ? Collection Time: 06/06/21  2:17 PM  ?Result Value Ref Range  ? Glucose-Capillary 130 (H) 70 - 99 mg/dL  ?  Comment: Glucose reference range applies only to samples taken after fasting for at least 8 hours.  ?CBG monitoring, ED     Status: None  ? Collection Time: 06/06/21  3:21 PM  ?Result Value Ref Range  ? Glucose-Capillary 99 70 - 99 mg/dL  ?  Comment: Glucose reference range applies only to samples taken after fasting for at least 8 hours.  ? Comment 1 Notify RN   ? Comment 2 Document in Chart   ?  ? ?Psychiatric Specialty Exam: ?Physical Exam  ?Review of Systems  ?Weight 218 lb (98.9 kg).Body mass index is 29.57 kg/m?.  ?General Appearance: NA  ?Eye Contact:  NA  ?Speech:  Slow  ?Volume:  Decreased  ?Mood:  Euthymic  ?Affect:  NA  ?Thought Process:  Descriptions of Associations: Intact  ?Orientation:  Full (Time, Place, and Person)  ?Thought Content:  WDL  ?Suicidal Thoughts:  No  ?Homicidal Thoughts:  No  ?Memory:  Immediate;   Fair ?Recent;   Fair ?Remote;   Fair  ?Judgement:  Intact  ?Insight:  Present  ?Psychomotor Activity:  NA  ?Concentration:  Concentration: Fair and Attention Span: Fair  ?Recall:  Fair  ?Fund of Knowledge:  Fair  ?Language:  Fair  ?Akathisia:  No  ?Handed:  Right  ?AIMS (if indicated):     ?Assets:  Communication Skills ?Desire for Improvement ?Housing ?Social Support  ?ADL's:  Intact  ?Cognition:  Impaired,  Mild  ?Sleep:    much better  ? ? ? ? ?Assessment and Plan: ?Bipolar disorder type II.  PTSD. ? ?I reviewed blood work results from recent emergency room visit.  His blood glucose remains very high and his creatinine is 1.51 which is slightly improved from the past.  I encouraged to keep the appointment with neurology and primary care physician for better management of his diabetes.  He is overall doing better with his mood, anger and sleep.  He has no more nightmares.  I recommend take his medication as prescribed and his pillbox is managed by his wife.  We will continue Latuda 60 mg and trazodone 100 mg at bedtime.  His Effexor is given by his neurologist.  Recommended to call us back if is any question or any concern.  We will follow up in 3 months. ? ?Follow Up Instructions: ? ?  ?I discussed the assessment and treatment plan with the patient. The patient was provided an opportunity to ask questions and all were answered. The patient agreed with the plan and demonstrated an understanding of the instructions. ?  ?The patient was advised to call back or seek an in-person evaluation if the symptoms worsen or if the condition fails to improve as anticipated. ? ?Collaboration of Care: Other provider involved in patient's care AEB notes are available in epic to review. ? ?Patient/Guardian was advised Release of Information must be obtained prior to any record release in order to collaborate their care with an outside provider. Patient/Guardian was advised if they have not already done so to contact the registration department to sign all necessary forms in order for [place] to release information regarding their care.  ? ?Consent: Patient/Guardian gives verbal consent for treatment and assignment of benefits for services provided during this visit. Patient/Guardian expressed understanding and agreed to proceed.   ? ?I provided 16 minutes of non-face-to-face time during this encounter. ? ? ?Kathlee Nations, MD  ?

## 2021-07-05 ENCOUNTER — Ambulatory Visit (INDEPENDENT_AMBULATORY_CARE_PROVIDER_SITE_OTHER): Payer: No Typology Code available for payment source | Admitting: Surgical

## 2021-07-05 ENCOUNTER — Encounter: Payer: Self-pay | Admitting: Surgical

## 2021-07-05 DIAGNOSIS — M25511 Pain in right shoulder: Secondary | ICD-10-CM

## 2021-07-05 DIAGNOSIS — R29898 Other symptoms and signs involving the musculoskeletal system: Secondary | ICD-10-CM | POA: Diagnosis not present

## 2021-07-05 NOTE — Progress Notes (Signed)
? ?Office Visit Note ?  ?Patient: Vincent [name]           ?Date of Birth: 02-Apr-1965           ?MRN: [id number] ?Visit Date: 07/05/2021 ?Requested by: Hayden Rasmussen, MD ?[address] ?[address] ?[place],  [place] [id number] ?PCP: Hayden Rasmussen, MD ? ?Subjective: ?Chief Complaint  ?Patient presents with  ? Other  ?   ?Scan review  ? ? ?HPI: Vincent [name] is a 56 y.o. male who presents to the office for MRI review. Continues to complain mainly of right arm weakness.  Saw Dr. Sharol Given several weeks ago with MRI obtained to review today of the right shoulder.  He describes onset of right shoulder pain and weakness without any history of injury.  He is currently on disability and does not work.  On disability for diabetic neuropathy and back pain.  Last A1c was 11.8 in September 2022.  He has difficulty lifting his arm and cannot really go overhead at all.  He describes pain that travels from the trapezius region to the elbow but occasionally past the elbow down into his fingers.  Pain is worse at night.  Does note history of prior arthroscopic debridement of the right shoulder about 5 years ago.  He had cortisone injection by his PCP about 1 month ago in the subacromial space that provided some relief for about a week.  He has occasional numbness and tingling in his right hand which may be due to diabetic neuropathy. ? ?MRI results revealed: ?MR BRAIN WO CONTRAST ? ?Result Date: 06/06/2021 ?CLINICAL DATA:  Neuro deficit, acute, stroke suspected. Right-sided weakness. EXAM: MRI HEAD WITHOUT CONTRAST TECHNIQUE: Multiplanar, multiecho pulse sequences of the brain and surrounding structures were obtained without intravenous contrast. COMPARISON:  Head CT 06/06/2021 and MRI 11/14/2020 FINDINGS: Brain: There is no evidence of an acute infarct, intracranial hemorrhage, mass, midline shift, or extra-axial fluid collection. The ventricles and sulci are normal. A possible tiny chronic infarct inferiorly in the right cerebellar  hemisphere is unchanged. The brain is otherwise unremarkable in signal. Vascular: Major intracranial vascular flow voids are preserved. Skull and upper cervical spine: Unremarkable bone marrow signal. Sinuses/Orbits: Unremarkable orbits. Mild mucosal thickening in the left maxillary sinus. Clear mastoid air cells. Other: None. IMPRESSION: No acute intracranial abnormality. Electronically Signed   By: Logan Bores M.D.   On: 06/06/2021 16:54  ? ?MR Cervical Spine Wo Contrast ? ?Result Date: 06/06/2021 ?CLINICAL DATA:  Right arm weakness. EXAM: MRI CERVICAL SPINE WITHOUT CONTRAST TECHNIQUE: Multiplanar, multisequence MR imaging of the cervical spine was performed. No intravenous contrast was administered. COMPARISON:  Cervical spine CT 06/06/2021 FINDINGS: Alignment: Normal. Vertebrae: No fracture, suspicious marrow lesion, or significant marrow edema. Cord: Normal signal and morphology. Posterior Fossa, vertebral arteries, paraspinal tissues: Unremarkable. Disc levels: C2-3: Negative. C3-4: Asymmetrically advanced left facet arthrosis with facet ankylosis. No stenosis. C4-5: Negative. C5-6: Mild disc bulging results in borderline spinal stenosis without significant mass effect on the spinal cord or significant neural foraminal stenosis. C6-7: Mild disc bulging without stenosis. C7-T1: Negative. IMPRESSION: Mild cervical spondylosis without significant stenosis. Electronically Signed   By: Logan Bores M.D.   On: 06/06/2021 17:35  ? ?MR SHOULDER RIGHT WO CONTRAST ? ?Result Date: 06/27/2021 ?CLINICAL DATA:  Shoulder pain. Rotator cuff disorder suspected. History of rotator cuff surgery in 2015. EXAM: MRI OF THE RIGHT SHOULDER WITHOUT CONTRAST TECHNIQUE: Multiplanar, multisequence MR imaging of the shoulder was performed. No intravenous contrast  was administered. COMPARISON:  Right shoulder radiographs 06/14/2021 FINDINGS: Rotator cuff: There is moderate intermediate T2 signal and thickening tendinosis of the anterior  infraspinatus tendon minimally extending into the adjacent posterior supraspinatus. Mild subcortical cystic changes within the humeral head deep to the posterior infraspinatus tendon insertion. The subscapularis and teres minor are intact. Muscles: No rotator cuff muscle atrophy, fatty infiltration, or edema. Biceps long head: The intra-articular long head of the biceps tendon is intact. Acromioclavicular Joint: Postsurgical changes of prior distal clavicle excision and acromioplasty. Type 1 acromion. Mild fluid within the subacromial/subdeltoid bursa. Glenohumeral Joint: Mild-to-moderate thinning of the glenoid and humeral head cartilage. Labrum: Mild attenuation and peripheral degenerative irregularity of the posterosuperior glenoid labrum. Bones:  No acute fracture. Other: There is small teardrop shaped focus of fluid anterior to the superior aspect of the subscapularis tendon measuring up to 14 mm (sagittal series 7, image 5), possibly a tiny ganglion. IMPRESSION:: IMPRESSION: 1. Moderate anterior infraspinatus tendinosis minimally involving the adjacent supraspinatus tendon fibers. No rotator cuff tear. 2. Postsurgical changes of prior distal clavicle excision and acromioplasty. 3. Mild subacromial/subdeltoid bursitis. 4. Mild-to-moderate glenohumeral cartilage degenerative changes. Electronically Signed   By: Yvonne Kendall M.D.   On: 06/27/2021 12:01   ? ?             ?ROS: All systems reviewed are negative as they relate to the chief complaint within the history of present illness.  Patient denies fevers or chills. ? ?Assessment & Plan: ?Visit Diagnoses:  ?1. Right arm weakness   ?2. Acute pain of right shoulder   ? ? ?Plan: Vincent [name] is a 56 y.o. male who presents to the office for evaluation of right shoulder and arm weakness.  He has history of atraumatic onset of right arm weakness that has been ongoing for about 2 months according to him.  He has weakness of supination, bicep flexion and to a  greater extent all of his rotator cuff musculature and the deltoid muscle.  Numbness and tingling that is reproducible on exam with movement of his shoulder that travels down to the fingertips.  Some this numbness that he is experiencing may be due to diabetic neuropathy.  He has MRI of the right shoulder demonstrating no significant rotator cuff pathology aside from tendinosis of the infra and supra.  Also has recent MRI of the brain with no acute abnormality and an MRI of the cervical spine that demonstrated no significant stenosis that may be responsible for his symptoms on the right-hand side.  Plan for further evaluation of possible brachial plexopathy such as Parson[place]-Turner syndrome with nerve conduction study of the right upper extremity.  Follow-up after nerve conduction study with Dr. Marlou Sa to review. ? ?Follow-Up Instructions: No follow-ups on file.  ? ?Orders:  ?Orders Placed This Encounter  ?Procedures  ? Ambulatory referral to Physical Medicine Rehab  ? ?No orders of the defined types were placed in this encounter. ? ? ? ? Procedures: ?No procedures performed ? ? ?Clinical Data: ?No additional findings. ? ?Objective: ?Vital Signs: There were no vitals taken for this visit. ? ?Physical Exam:  ?Constitutional: Patient appears well-developed ?HEENT:  ?Head: Normocephalic ?Eyes:EOM are normal ?Neck: Normal range of motion ?Cardiovascular: Normal rate ?Pulmonary/chest: Effort normal ?Neurologic: Patient is alert ?Skin: Skin is warm ?Psychiatric: Patient has normal mood and affect ? ?Ortho Exam: Ortho exam demonstrates right shoulder with 40 degrees external rotation, 90 degrees abduction, 170 degrees forward flexion passively.  Active range of motion with only  20 degrees abduction and 30 degrees forward flexion.  5/5 motor strength of bilateral grip strength, EPL, FPL, finger abduction, wrist extension, pronation.  4/5 motor strength of supination, bicep flexion of right arm compared with 5/5 motor strength  on the left side.  Testing this does not really cause him pain, just describes weakness.  3/5 motor strength of infraspinatus, supraspinatus, subscapularis, deltoid.  Deltoid muscle is firing weakly compared

## 2021-07-14 ENCOUNTER — Telehealth: Payer: Self-pay | Admitting: Physical Medicine and Rehabilitation

## 2021-07-14 NOTE — Telephone Encounter (Signed)
Patient called., returning a call to schedule with Dr. Ernestina Patches. [phone] ?

## 2021-07-28 ENCOUNTER — Observation Stay (HOSPITAL_COMMUNITY)
Admission: EM | Admit: 2021-07-28 | Discharge: 2021-07-31 | Disposition: A | Discharge status: Other Acute Inpatient Hospital | Payer: No Typology Code available for payment source | Attending: Family Medicine | Admitting: Family Medicine

## 2021-07-28 ENCOUNTER — Other Ambulatory Visit: Payer: Self-pay

## 2021-07-28 ENCOUNTER — Emergency Department (HOSPITAL_COMMUNITY): Payer: No Typology Code available for payment source

## 2021-07-28 ENCOUNTER — Encounter (HOSPITAL_COMMUNITY): Payer: Self-pay

## 2021-07-28 DIAGNOSIS — N179 Acute kidney failure, unspecified: Secondary | ICD-10-CM

## 2021-07-28 DIAGNOSIS — R569 Unspecified convulsions: Secondary | ICD-10-CM

## 2021-07-28 DIAGNOSIS — Z7984 Long term (current) use of oral hypoglycemic drugs: Secondary | ICD-10-CM | POA: Insufficient documentation

## 2021-07-28 DIAGNOSIS — I129 Hypertensive chronic kidney disease with stage 1 through stage 4 chronic kidney disease, or unspecified chronic kidney disease: Secondary | ICD-10-CM | POA: Diagnosis not present

## 2021-07-28 DIAGNOSIS — F3181 Bipolar II disorder: Secondary | ICD-10-CM | POA: Diagnosis present

## 2021-07-28 DIAGNOSIS — N189 Chronic kidney disease, unspecified: Secondary | ICD-10-CM

## 2021-07-28 DIAGNOSIS — J449 Chronic obstructive pulmonary disease, unspecified: Secondary | ICD-10-CM | POA: Diagnosis not present

## 2021-07-28 DIAGNOSIS — Z794 Long term (current) use of insulin: Secondary | ICD-10-CM

## 2021-07-28 DIAGNOSIS — Z20822 Contact with and (suspected) exposure to covid-19: Secondary | ICD-10-CM | POA: Insufficient documentation

## 2021-07-28 DIAGNOSIS — R4182 Altered mental status, unspecified: Secondary | ICD-10-CM | POA: Diagnosis not present

## 2021-07-28 DIAGNOSIS — I2510 Atherosclerotic heart disease of native coronary artery without angina pectoris: Secondary | ICD-10-CM | POA: Diagnosis not present

## 2021-07-28 DIAGNOSIS — Z8673 Personal history of transient ischemic attack (TIA), and cerebral infarction without residual deficits: Secondary | ICD-10-CM | POA: Insufficient documentation

## 2021-07-28 DIAGNOSIS — T50902A Poisoning by unspecified drugs, medicaments and biological substances, intentional self-harm, initial encounter: Secondary | ICD-10-CM | POA: Diagnosis present

## 2021-07-28 DIAGNOSIS — G40919 Epilepsy, unspecified, intractable, without status epilepticus: Secondary | ICD-10-CM | POA: Diagnosis not present

## 2021-07-28 DIAGNOSIS — N1832 Chronic kidney disease, stage 3b: Secondary | ICD-10-CM | POA: Insufficient documentation

## 2021-07-28 DIAGNOSIS — G8929 Other chronic pain: Secondary | ICD-10-CM

## 2021-07-28 DIAGNOSIS — G9341 Metabolic encephalopathy: Principal | ICD-10-CM | POA: Insufficient documentation

## 2021-07-28 DIAGNOSIS — Z7982 Long term (current) use of aspirin: Secondary | ICD-10-CM | POA: Diagnosis not present

## 2021-07-28 DIAGNOSIS — E1165 Type 2 diabetes mellitus with hyperglycemia: Secondary | ICD-10-CM | POA: Diagnosis not present

## 2021-07-28 DIAGNOSIS — Z7902 Long term (current) use of antithrombotics/antiplatelets: Secondary | ICD-10-CM | POA: Diagnosis not present

## 2021-07-28 DIAGNOSIS — E039 Hypothyroidism, unspecified: Secondary | ICD-10-CM | POA: Insufficient documentation

## 2021-07-28 DIAGNOSIS — F17210 Nicotine dependence, cigarettes, uncomplicated: Secondary | ICD-10-CM | POA: Diagnosis not present

## 2021-07-28 DIAGNOSIS — F319 Bipolar disorder, unspecified: Secondary | ICD-10-CM | POA: Diagnosis present

## 2021-07-28 DIAGNOSIS — G40909 Epilepsy, unspecified, not intractable, without status epilepticus: Secondary | ICD-10-CM | POA: Diagnosis not present

## 2021-07-28 DIAGNOSIS — Z8551 Personal history of malignant neoplasm of bladder: Secondary | ICD-10-CM | POA: Diagnosis not present

## 2021-07-28 DIAGNOSIS — T402X2A Poisoning by other opioids, intentional self-harm, initial encounter: Secondary | ICD-10-CM | POA: Diagnosis not present

## 2021-07-28 DIAGNOSIS — F32A Depression, unspecified: Secondary | ICD-10-CM

## 2021-07-28 HISTORY — DX: Poisoning by unspecified drugs, medicaments and biological substances, intentional self-harm, initial encounter: T50.902A

## 2021-07-28 LAB — ETHANOL: Alcohol, Ethyl (B): <10 mg/dL (ref <10)

## 2021-07-28 LAB — BASIC METABOLIC PANEL
Anion gap: 8 (ref 5–15)
BUN: 20 mg/dL (ref 6–20)
CO2: 26 mmol/L (ref 22–32)
Calcium: 8.9 mg/dL (ref 8.9–10.3)
Chloride: 105 mmol/L (ref 98–111)
Creatinine, Ser: 1.85 mg/dL — ABNORMAL HIGH (ref 0.61–1.24)
GFR, Estimated: 43 mL/min — ABNORMAL LOW (ref >60)
Glucose, Bld: 411 mg/dL — ABNORMAL HIGH (ref 70–99)
Potassium: 4.2 mmol/L (ref 3.5–5.1)
Sodium: 139 mmol/L (ref 135–145)

## 2021-07-28 LAB — CBC
HCT: 44.9 % (ref 39.0–52.0)
Hemoglobin: 14.5 g/dL (ref 13.0–17.0)
MCH: 27.3 pg (ref 26.0–34.0)
MCHC: 32.3 g/dL (ref 30.0–36.0)
MCV: 84.6 fL (ref 80.0–100.0)
Platelets: 208 10*3/uL (ref 150–400)
RBC: 5.31 MIL/uL (ref 4.22–5.81)
RDW: 13.2 % (ref 11.5–15.5)
WBC: 6.9 10*3/uL (ref 4.0–10.5)
nRBC: 0 % (ref 0.0–0.2)

## 2021-07-28 LAB — BLOOD GAS, VENOUS
Acid-base deficit: 1.9 mmol/L (ref 0.0–2.0)
Bicarbonate: 25.5 mmol/L (ref 20.0–28.0)
Drawn by: 6125
O2 Saturation: 94.8 %
Patient temperature: 37.1
pCO2, Ven: 53 mmHg (ref 44–60)
pH, Ven: 7.29 (ref 7.25–7.43)
pO2, Ven: 70 mmHg — ABNORMAL HIGH (ref 32–45)

## 2021-07-28 LAB — GLUCOSE, CAPILLARY
Glucose-Capillary: 417 mg/dL — ABNORMAL HIGH (ref 70–99)
Glucose-Capillary: 308 mg/dL — ABNORMAL HIGH (ref 70–99)

## 2021-07-28 LAB — HIV ANTIBODY (ROUTINE TESTING W REFLEX): HIV Screen 4th Generation wRfx: NONREACTIVE

## 2021-07-28 LAB — RESP PANEL BY RT-PCR (FLU A&B, COVID) ARPGX2
Influenza A by PCR: NEGATIVE
Influenza B by PCR: NEGATIVE
SARS Coronavirus 2 by RT PCR: NEGATIVE

## 2021-07-28 LAB — CBG MONITORING, ED: Glucose-Capillary: 111 mg/dL — ABNORMAL HIGH (ref 70–99)

## 2021-07-28 LAB — ACETAMINOPHEN LEVEL: Acetaminophen (Tylenol), Serum: <10 ug/mL — ABNORMAL LOW (ref 10–30)

## 2021-07-28 LAB — AMMONIA: Ammonia: 36 umol/L — ABNORMAL HIGH (ref 9–35)

## 2021-07-28 LAB — HEMOGLOBIN A1C
Hgb A1c MFr Bld: 14.1 % — ABNORMAL HIGH (ref 4.8–5.6)
Mean Plasma Glucose: 357.97 mg/dL

## 2021-07-28 LAB — SALICYLATE LEVEL: Salicylate Lvl: <7 mg/dL — ABNORMAL LOW (ref 7.0–30.0)

## 2021-07-28 LAB — CK: Total CK: 371 U/L (ref 49–397)

## 2021-07-28 MED ORDER — LAMOTRIGINE 25 MG PO TABS
150.0000 mg | ORAL_TABLET | Freq: Two times a day (BID) | ORAL | Status: DC
  Administered 2021-07-28 – 2021-07-30 (×4): 150 mg via ORAL
  Filled 2021-07-28 (×5): qty 2

## 2021-07-28 MED ORDER — CLOPIDOGREL BISULFATE 75 MG PO TABS
75.0000 mg | ORAL_TABLET | Freq: Every day | ORAL | Status: DC
  Administered 2021-07-30: 75 mg via ORAL
  Filled 2021-07-28 (×2): qty 1

## 2021-07-28 MED ORDER — ONDANSETRON HCL 4 MG/2ML IJ SOLN: 4.0000 mg | Freq: Four times a day (QID) | INTRAMUSCULAR | Status: DC | PRN

## 2021-07-28 MED ORDER — SODIUM CHLORIDE 0.9 % IV SOLN: INTRAVENOUS | Status: DC

## 2021-07-28 MED ORDER — INSULIN ASPART 100 UNIT/ML IJ SOLN
0.0000 [IU] | INTRAMUSCULAR | Status: DC
  Administered 2021-07-28: 11 [IU] via SUBCUTANEOUS
  Administered 2021-07-28: 15 [IU] via SUBCUTANEOUS

## 2021-07-28 MED ORDER — ALBUTEROL SULFATE HFA 108 (90 BASE) MCG/ACT IN AERS
2.0000 | INHALATION_SPRAY | RESPIRATORY_TRACT | Status: DC | PRN
  Administered 2021-07-28: 2 via RESPIRATORY_TRACT
  Filled 2021-07-28: qty 6.7

## 2021-07-28 MED ORDER — INSULIN ASPART 100 UNIT/ML IJ SOLN
0.0000 [IU] | Freq: Three times a day (TID) | INTRAMUSCULAR | Status: DC
  Administered 2021-07-29: 5 [IU] via SUBCUTANEOUS
  Administered 2021-07-30: 8 [IU] via SUBCUTANEOUS

## 2021-07-28 MED ORDER — TRAZODONE HCL 100 MG PO TABS
100.0000 mg | ORAL_TABLET | Freq: Every evening | ORAL | Status: DC | PRN
  Administered 2021-07-29: 100 mg via ORAL
  Filled 2021-07-28: qty 1

## 2021-07-28 MED ORDER — ONDANSETRON HCL 4 MG PO TABS: 4.0000 mg | ORAL_TABLET | Freq: Four times a day (QID) | ORAL | Status: DC | PRN

## 2021-07-28 MED ORDER — FENOFIBRATE 160 MG PO TABS
160.0000 mg | ORAL_TABLET | Freq: Every day | ORAL | Status: DC
  Administered 2021-07-30: 160 mg via ORAL
  Filled 2021-07-28 (×2): qty 1

## 2021-07-28 MED ORDER — TAMSULOSIN HCL 0.4 MG PO CAPS
0.8000 mg | ORAL_CAPSULE | Freq: Every day | ORAL | Status: DC
  Administered 2021-07-28 – 2021-07-30 (×2): 0.8 mg via ORAL
  Filled 2021-07-28 (×3): qty 2

## 2021-07-28 MED ORDER — ASPIRIN 81 MG PO TBEC
81.0000 mg | DELAYED_RELEASE_TABLET | Freq: Every day | ORAL | Status: DC
  Administered 2021-07-30: 81 mg via ORAL
  Filled 2021-07-28 (×2): qty 1

## 2021-07-28 MED ORDER — LURASIDONE HCL 20 MG PO TABS
60.0000 mg | ORAL_TABLET | Freq: Every day | ORAL | Status: DC
  Administered 2021-07-30: 60 mg via ORAL
  Filled 2021-07-28 (×3): qty 3

## 2021-07-28 MED ORDER — PREGABALIN 75 MG PO CAPS
225.0000 mg | ORAL_CAPSULE | Freq: Every day | ORAL | Status: DC
  Administered 2021-07-30: 225 mg via ORAL
  Filled 2021-07-28 (×2): qty 3

## 2021-07-28 MED ORDER — ALBUTEROL SULFATE (2.5 MG/3ML) 0.083% IN NEBU: 2.5000 mg | INHALATION_SOLUTION | Freq: Four times a day (QID) | RESPIRATORY_TRACT | Status: DC | PRN

## 2021-07-28 MED ORDER — PANTOPRAZOLE SODIUM 40 MG PO TBEC
40.0000 mg | DELAYED_RELEASE_TABLET | Freq: Every day | ORAL | Status: DC
  Administered 2021-07-30: 40 mg via ORAL
  Filled 2021-07-28 (×2): qty 1

## 2021-07-28 MED ORDER — SODIUM CHLORIDE 0.9% FLUSH
3.0000 mL | Freq: Two times a day (BID) | INTRAVENOUS | Status: DC
  Administered 2021-07-28 – 2021-07-30 (×3): 3 mL via INTRAVENOUS

## 2021-07-28 MED ORDER — INSULIN ASPART 100 UNIT/ML IJ SOLN
15.0000 [IU] | Freq: Two times a day (BID) | INTRAMUSCULAR | Status: DC
  Administered 2021-07-29 – 2021-07-30 (×3): 15 [IU] via SUBCUTANEOUS

## 2021-07-28 MED ORDER — LORAZEPAM 2 MG/ML IJ SOLN: 1.0000 mg | INTRAMUSCULAR | Status: DC | PRN

## 2021-07-28 MED ORDER — SODIUM CHLORIDE 0.9 % IV BOLUS
1000.0000 mL | Freq: Once | INTRAVENOUS | Status: AC
  Administered 2021-07-28: 1000 mL via INTRAVENOUS

## 2021-07-28 MED ORDER — LORAZEPAM 2 MG/ML IJ SOLN
1.0000 mg | Freq: Once | INTRAMUSCULAR | Status: AC
  Administered 2021-07-28: 1 mg via INTRAVENOUS
  Filled 2021-07-28: qty 1

## 2021-07-28 MED ORDER — VENLAFAXINE HCL ER 75 MG PO CP24
150.0000 mg | ORAL_CAPSULE | Freq: Two times a day (BID) | ORAL | Status: DC
  Administered 2021-07-28 – 2021-07-30 (×4): 150 mg via ORAL
  Filled 2021-07-28 (×5): qty 2

## 2021-07-28 MED ORDER — LIDOCAINE 5 % EX PTCH: 1.0000 | MEDICATED_PATCH | Freq: Every day | CUTANEOUS | Status: DC | PRN

## 2021-07-28 MED ORDER — ENOXAPARIN SODIUM 40 MG/0.4ML IJ SOSY
40.0000 mg | PREFILLED_SYRINGE | INTRAMUSCULAR | Status: DC
  Administered 2021-07-28: 40 mg via SUBCUTANEOUS
  Filled 2021-07-28 (×2): qty 0.4

## 2021-07-28 NOTE — H&P (Signed)
History and Physical    Patient: Vincent [name] JXB:[id number] DOB: 1965-11-28 DOA: 07/28/2021 DOS: the patient was seen and examined on 07/28/2021 PCP: Hayden Rasmussen, MD  Patient coming from: Home via EMS  Chief Complaint:  Chief Complaint  Patient presents with   Seizures   HPI: Vincent [name] is a 56 y.o. male with medical history significant of hypertension, hyperlipidemia, COPD, poorly controlled diabetes mellitus type 2, hypothyroidism,bladder cancer, rheumatoid arthritis, benign essential tremor, anxiety, bipolar 2 disorder, and history of seizure on lamotrigine who presents after being noted to be acutely altered.  History is obtained from his wife over the phone as the patient is currently not coherent enough to give any history.  This morning at around 5:30 AM she had heard noises for which she thought it was the cat initially, but when she went into the kitchen found her husband laying on his back on the floor saying " help me I am dying".  She reported that he also told her to tell Ovid Curd their granddaughter that he loved her.  She tried to help him up, but he became unresponsive and went limp.  She called 911.  During this time she noticed that his eating was shallow, and it seemed as though he had sonorous respirations.  Due to the concern for agonal breathing his wife was instructed to start CPR which she did, but only did 1 compression before the patient started moaning and was instructed to stop.  Normal 1 had been instructed to give him a dose of Narcan intranasally due to the patient being on chronic pain medications of morphine.  After giving him first dose of Narcan she did not notice any significant change in his symptoms.  911 instructed her to give a second dose of Narcan a couple minutes later which she did.  After the second dose she states that patient had been noted to have shaking of his upper extremities.   His wife notes that at some point he had told her that he was  trying to harm himself.  He reported taking the whole bottle of his morphine which she reports was last filled 4 days ago.  She reported that at one point he also stated that this attempt failed, but next time wont.  Patient wife reports that he has been compliant with his Lamictal.  After EMS arrival he was noted to have 2 grand mal seizures lasting 1 to 2 minutes each in route for which he was given 7.5 mg of Versed.  Upon admission into the emergency department patient was seen to be afebrile with respirations 18-24, blood pressures 97/76 to 174/84, and O2 saturations maintained on room air.  CT scan of the head and cervical spine did not note any acute intercranial abnormality or signs of a fracture.  Labs significant for glucose 411, BUN 20, creatinine 1.85, and ammonia level 36.  Patient having given 2 L of normal saline IV fluids and 1 mg of Ativan.  Review of Systems: unable to review all systems due to the inability of the patient to answer questions. Past Medical History:  Diagnosis Date   Anxiety    Benign essential tremor    Bipolar 2 disorder ([place])    followed by [facility]--- dr s. Adele Schilder   Bladder cancer [facility])    recurrent   CAD (coronary artery disease)    cardiac cath 2003  and 2011 both showed normal coronary arteries w/ preserved lvf;  Non obstructive on CTA  Oct 2019.    Chronic pain syndrome    back---- [facility] [place]   Cold extremities    BLE   COPD (chronic obstructive pulmonary disease) ([place])    DDD (degenerative disc disease), lumbar    Diabetic peripheral neuropathy ([place])    Gastroparesis    followed by dr Henrene Pastor   GERD (gastroesophageal reflux disease)    Hiatal hernia    History of bladder cancer urologist-  previously dr Consuella Lose;  now dr gay   papillay TCC (Ta G1)  s/p TURBT and chemo instillation 2014   History of chest pain 12/2017   heart cath normal   History of encephalopathy 05/27/2015   admission w/ acute encephalopathy thought to be  secondary to pain meds and COPD   History of gastric ulcer    History of Helicobacter pylori infection    History of kidney stones    History of TIA (transient ischemic attack) 2008  and 10-19-2018    no residual's   History of traumatic head injury 2010   w/ LOC  per pt needed stitches, hit in head with a mower blade   Hyperlipidemia    Hypertension    Hypogonadism male    s/p  bilateral orchiectomy   Hypothyroidism    Insomnia    Mild obstructive sleep apnea    study in epic 12-04-2016, no cpap   PTSD (post-traumatic stress disorder)    chronic   PTSD (post-traumatic stress disorder)    RA (rheumatoid arthritis) ([place])    followed by [facility]   Seizures, transient [facility]) neurologist-  dr Krista Blue--  differential dx complex partial seizure .vs.  mood disorder .vs.  pseudoseizure--  negative EEG's   confusion episodes and staring spells since 11/ 2015   (03-26-2020 per pt wife last seizure 10 /2021)   Transient confusion NEUOROLOGIST-  DR Krista Blue   Episodes since 11/ 2015--  neurologist dx  differential complex partial seizure  .vs. mood disorder . vs. pseudoseizure   Type 2 diabetes mellitus treated with insulin [facility])    endocrinologist--- dr Loanne Drilling---  (03-26-2020 pt does not check blood sugar at home)   Past Surgical History:  Procedure Laterality Date   AMPUTATION Left 04/28/2020   Procedure: LEFT LITTLE FINGER AMPUTATION;  Surgeon: Newt Minion, MD;  Location: [place];  Service: Orthopedics;  Laterality: Left;   CARDIAC CATHETERIZATION  12-27-2001  DR Einar Gip  &  05-26-2009  DR Irish Lack   RESULTS FOR BOTH ARE NORMAL CORONARIES AND PERSERVED LVF/ EF 60%   CARPAL TUNNEL RELEASE Bilateral right 09-16-2003;  left ?   CARPAL TUNNEL RELEASE Left 02/25/2015   Procedure: LEFT CARPAL TUNNEL RELEASE;  Surgeon: Leanora Cover, MD;  Location: [place];  Service: Orthopedics;  Laterality: Left;   CYSTOSCOPY N/A 10/10/2012   Procedure: CYSTOSCOPY CLOT EVACUATION  FULGERATION OF BLEEDERS ;  Surgeon: Claybon Jabs, MD;  [facility];  Service: Urology;  Laterality: N/A;   CYSTOSCOPY WITH BIOPSY N/A 11/26/2015   Procedure: CYSTOSCOPY WITH BIOPSY AND FULGURATION;  Surgeon: Kathie Rhodes, MD;  Location: [place];  Service: Urology;  Laterality: N/A;   ESOPHAGOGASTRODUODENOSCOPY  2014   LAPAROSCOPIC CHOLECYSTECTOMY  11-17-2010   ORCHIECTOMY Right 02/21/2016   Procedure: SCROTAL ORCHIECTOMY with TESTICULAR PROSTHESIS IMPLANT;  Surgeon: Kathie Rhodes, MD;  Location: [facility];  Service: Urology;  Laterality: Right;   ORCHIECTOMY Left 09/02/2018   Procedure: ORCHIECTOMY;  Surgeon: Kathie Rhodes,  MD;  Location: [place];  Service: Urology;  Laterality: Left;   ROTATOR CUFF REPAIR Right 12/2004   TRANSURETHRAL RESECTION OF BLADDER TUMOR N/A 08/09/2012   Procedure: TRANSURETHRAL RESECTION OF BLADDER TUMOR (TURBT) WITH GYRUS WITH MITOMYCIN C;  Surgeon: Claybon Jabs, MD;  Location: [facility];  Service: Urology;  Laterality: N/A;   TRANSURETHRAL RESECTION OF BLADDER TUMOR N/A 03/29/2020   Procedure: TRANSURETHRAL RESECTION OF BLADDER TUMOR (TURBT) and post-op instillation of gemcitabine;  Surgeon: Janith Lima, MD;  Location: [facility];  Service: Urology;  Laterality: N/A;   TRANSURETHRAL RESECTION OF BLADDER TUMOR WITH GYRUS (TURBT-GYRUS) N/A 02/27/2014   Procedure: TRANSURETHRAL RESECTION OF BLADDER TUMOR WITH GYRUS (TURBT-GYRUS);  Surgeon: Claybon Jabs, MD;  Location: [facility];  Service: Urology;  Laterality: N/A;   Social History:  reports that he has been smoking cigarettes. He has a 57.00 pack-year smoking history. He has never used smokeless tobacco. He reports that he does not drink alcohol and does not use drugs.  Allergies  Allergen Reactions   Celebrex [Celecoxib] Anaphylaxis and Rash   Hydrocodone Rash and Other (See Comments)     "blisters developed on arms"   Sulfa Antibiotics Rash    Family History  Problem Relation Age of Onset   Diabetes Mother    Diabetes Father    Hypertension Father    Heart attack Father 52       died age 28   Alcohol abuse Father    Colon cancer Neg Hx    Esophageal cancer Neg Hx    Stomach cancer Neg Hx    Rectal cancer Neg Hx     Prior to Admission medications   Medication Sig Start Date End Date Taking? Authorizing Provider  aspirin EC 81 MG EC tablet Take 1 tablet (81 mg total) by mouth daily. Swallow whole. 11/16/20  Yes Patrecia Pour, MD  clopidogrel (PLAVIX) 75 MG tablet Take 1 tablet (75 mg total) by mouth daily. 07/28/20  Yes Marcial Pacas, MD  fenofibrate 160 MG tablet Take 160 mg by mouth daily. 12/31/19  Yes [provider]  HUMALOG KWIKPEN 200 UNIT/ML KwikPen Inject 15 Units into the skin in the morning and at bedtime. 06/17/21  Yes [provider]  lamoTRIgine (LAMICTAL) 150 MG tablet Take 1 tablet (150 mg total) by mouth 2 (two) times daily. 07/28/20  Yes Marcial Pacas, MD  Lurasidone HCl (LATUDA) 60 MG TABS Take 1 tablet (60 mg total) by mouth daily with breakfast. 06/30/21  Yes Arfeen, Arlyce Harman, MD  metFORMIN (GLUCOPHAGE-XR) 500 MG 24 hr tablet Take 500 mg by mouth 2 (two) times daily. 08/24/18  Yes [provider]  morphine (MS CONTIN) 60 MG 12 hr tablet Take 60 mg by mouth 2 (two) times daily. Every 12 hours 06/12/19  Yes [provider]  morphine (MSIR) 15 MG tablet Take 15 mg by mouth 2 (two) times daily as needed. For breakthrough pain  (03-26-2020 per pt wife pt takes this twice also 06/11/19  Yes [provider]  omeprazole (PRILOSEC) 40 MG capsule Take 40 mg by mouth daily.   Yes [provider]  tamsulosin (FLOMAX) 0.4 MG CAPS capsule Take 0.8 mg by mouth at bedtime.   Yes [provider]  testosterone cypionate (DEPOTESTOSTERONE CYPIONATE) 200 MG/ML injection Inject 200 mg into the skin every 14 (fourteen) days.  07/03/21  Yes [provider]  traZODone (DESYREL) 100 MG tablet Take 1  tablet (100 mg total) by mouth at bedtime as needed for sleep. 06/30/21  Yes Arfeen, Arlyce Harman, MD  Vitamin D, Ergocalciferol, (DRISDOL) 1.25 MG (50000 UNIT) CAPS capsule Take 50,000 Units by mouth every Wednesday. 12/26/19  Yes [provider]  acetaminophen (TYLENOL) 325 MG tablet Take 2 tablets (650 mg total) by mouth every 6 (six) hours as needed. Patient not taking: Reported on 07/28/2021 05/29/21   Wynona Dove A, DO  Continuous Blood Gluc Sensor (FREESTYLE LIBRE 2 SENSOR) MISC 1 Device by Does not apply route every 14 (fourteen) days. Patient taking differently: 1 Device by Does not apply route every 14 (fourteen) days. Has not received- waiting on Insurance 02/25/20   Renato Shin, MD  Dulaglutide (TRULICITY) 4.5 EX/5.2WU SOPN Inject 4.5 mg into the skin once a week. Patient not taking: Reported on 03/31/2021 01/22/20   Renato Shin, MD  insulin glargine, 2 Unit Dial, (TOUJEO MAX SOLOSTAR) 300 UNIT/ML Solostar Pen Inject 180 Units into the skin every morning. And pen needles 1/day Patient not taking: Reported on 07/28/2021 02/25/20   Renato Shin, MD  lidocaine (LIDODERM) 5 % Place 1 patch onto the skin daily as needed. Remove & Discard patch within 12 hours or as directed by MD Patient not taking: Reported on 07/28/2021 05/29/21   Jeanell Sparrow, DO  predniSONE (DELTASONE) 20 MG tablet Take three tablets once a day for one day, then take two tablets once a day for two days, then take one tablet once a day for 2 days. Patient not taking: Reported on 07/28/2021 06/07/21   Lajean Saver, MD  pregabalin (LYRICA) 225 MG capsule Take 1 capsule by mouth in the morning Patient taking differently: Take 225 mg by mouth See admin instructions. Pt takes 1 tablet in the morning and 2 tablets at night 03/01/21   Sarina Ill B, MD  rosuvastatin (CRESTOR) 10 MG tablet Take 1 tablet (10 mg total) by mouth daily. Patient not  taking: Reported on 07/28/2021 11/16/20   Patrecia Pour, MD  venlafaxine XR (EFFEXOR-XR) 150 MG 24 hr capsule TAKE 1 CAPSULE BY MOUTH ONCE DAILY WITH BREAKFAST Patient taking differently: 150 mg 2 (two) times daily. 01/31/21   Marcial Pacas, MD    Physical Exam: Vitals:   07/28/21 1528 07/28/21 1530 07/28/21 1532 07/28/21 1535  BP:  107/69    Pulse: 89 90 92 90  Resp: '12 12 12 12  '$ Temp:      TempSrc:      SpO2: 95% 94% 96% 97%    Constitutional: Middle-age male who is currently lethargic and not readily arousable Eyes: PERRL, lids and conjunctivae normal ENMT: Mucous membranes are dry. Posterior pharynx clear of any exudate or lesions.  Neck: normal, supple, no masses, no thyromegaly Respiratory: clear to auscultation bilaterally, no wheezing, no crackles. Normal respiratory effort. No accessory muscle use.  Cardiovascular: Regular rate and rhythm, no murmurs / rubs / gallops. No extremity edema. 2+ pedal pulses. No carotid bruits.  Abdomen: no tenderness, no masses palpated. No hepatosplenomegaly. Bowel sounds positive.  Musculoskeletal: no clubbing / cyanosis. No joint deformity upper and lower extremities. Good ROM, no contractures. Normal muscle tone.  Skin: no rashes, lesions, ulcers. No induration Neurologic: CN 2-12 grossly intact. Sensation intact, DTR normal. Strength 5/5 in all 4.  Psychiatric: Normal judgment and insight. Alert and oriented x 3. Normal mood.   Data Reviewed:    Assessment and Plan: Acute metabolic encephalopathy  Suspected suicide attemp by drug overdose Patient was found by  his wife noted to be unresponsive.  Reportedly took an unknown amount of one of his morphine pills. Home regimen includes MS Contin 60 mg twice daily and morphine 15 mg twice daily as needed both prescriptions were recently filled on 5/14.  He had been given 2 doses of intranasal Narcan without significant improvement in symptoms prior to arrival.  Wife noted that the patient stated that  this attempt failed, but next he wont.  Patient was involuntary committed. -Admit to a telemetry bed -Neurochecks -Check urine drug screen -Check venous blood gas -Hold home regimen of morphine -Continue venlafaxine -Will need formal consult to psychiatry once medically stable  Breakthrough seizure Patient reportedly had 2 grand mal seizures that were witnessed with EMS.  He has a known history of seizure disorder and reportedly had been compliant with Lamictal 150 mg twice daily.  Suspect may have likely been related to intranasal Narcan -Seizure precautions -Continue Lamictal -Ativan IV as needed for seizure-like activity -Formally consult neurology if patient has recurrent seizures or remains altered  Diabetes mellitus type 2, uncontrolled with hyperglycemia On admission glucose elevated to 411 without elevated anion gap.  Home medication regimen includes metformin 500 mg twice daily and Humalog 15 units twice daily. -Hypoglycemic protocols -Hold metformin -Continue Humalog 15 units twice daily -CBGs before every meal with moderate SSI -Adjust insulin regimen as needed  Acute kidney injury superimposed on chronic kidney disease stage IIIb Patient presents with creatinine 1.85 with BUN 28.  Creatinine previously noted to be 1.51 on 3/27.  This is greater than 0.3 increased from prior to suggest acute kidney injury.  -Check CK -Normal saline IV fluids at 75 mL/h -Recheck kidney function tomorrow morning  Chronic pain -Holding morphine -Use lidocaine patch as needed  Bipolar 2 disorder depression Patient is followed in outpatient setting by Dr. Adele Schilder at behavioral health.  The patient wife reports fears for her personal safety at this point in time. -Continue venlafaxine and Latuda -Deferred to psychiatry for any additional recommendations  CAD -Continue Plavix  BPH -Continue Flomax  GERD -Continue pharmacy substitution of Protonix  Advance Care Planning:   Code  Status: Full Code   Consults: none  Family Communication: Wife updated over the phone  Severity of Illness: The appropriate patient status for this patient is INPATIENT. Inpatient status is judged to be reasonable and necessary in order to provide the required intensity of service to ensure the patient's safety. The patient's presenting symptoms, physical exam findings, and initial radiographic and laboratory data in the context of their chronic comorbidities is felt to place them at high risk for further clinical deterioration. Furthermore, it is not anticipated that the patient will be medically stable for discharge from the hospital within 2 midnights of admission.   * I certify that at the point of admission it is my clinical judgment that the patient will require inpatient hospital care spanning beyond 2 midnights from the point of admission due to high intensity of service, high risk for further deterioration and high frequency of surveillance required.*  Author: Norval Morton, MD 07/28/2021 3:54 PM  For on call review [email or web address].

## 2021-07-28 NOTE — ED Notes (Signed)
Pt was incontinent of urine. Cleaned pt

## 2021-07-28 NOTE — ED Provider Notes (Signed)
[facility] DEPART[facility] Provider Note   CSN: [id number] Arrival date & time: 07/28/21  1062     History  Chief Complaint  Patient presents with   Seizures    METRO EDENFIELD is a 56 y.o. male with medical history of bipolar disorder, bladder cancer, chronic pain syndrome, CAD, COPD still smoking does not wear oxygen at baseline, diabetes, gastroparesis, GERD, seizures currently on lamotrigine.  Patient presents to ED for evaluation via EMS postictal.  Per EMS, patient had 2 grand mal seizures lasting 1 to 2 minutes each, witnessed.  Patient was given 7.'5mg'$  Versed prior to arrival from EMS.   Patient wife at bedside.  Patient wife states that they sleep in separate bedrooms.  Patient wife heard what she believed to be the cat moving around this morning around 5:30 in the morning.  Patient then heard her husband and assumed that her husband was chasing after the cat.  Patient wife reports then heard a loud thud, came out to find the patient unresponsive supine in the kitchen.  Patient wife reports he called 911 at this time, patient wife states patient then began to have "agonal breathing" and she was told by 911 to begin CPR.  Patient wife started CPR at this time which started the patient causing him to moan at which point the patient wife stopped CPR.  Patient states that this point she administered unknown dose of Narcan intranasally to the patient.  Patient wife states that the patient momentarily startled however did not become fully responsive.  Patient wife reports at this time she administered additional dose of Narcan intranasally and the patient then began shaking his upper extremities only uncontrollably and gurgling white foam from his mouth.  At this point EMS arrived and took over.  On my assessment, the patient is alert and oriented x2.  He is alert to time, place.  Initially patient tell me that he lives in [place].  After much questioning, the patient  admits to taking unknown amount of morphine last night.  When asked why he did this, he looked at his wife and said "you know why I did this".  Patient goes on to state that he just "does not want to be a bother to anyone".  Patient denies any other drugs.  Patient has no appreciable laceration of tongue, no noticeable soiling of pants.  Patient denies any pain.  Patient states he is compliant on his antiseizure medication, lamotrigine.  Patient denies blood thinners.  Patient denies any recent URIs, fevers, body aches or chills, nausea or vomiting.  Patient and wife deny any excessive alcohol consumption, alcohol being in the house.   Seizures     Home Medications Prior to Admission medications   Medication Sig Start Date End Date Taking? Authorizing Provider  aspirin EC 81 MG EC tablet Take 1 tablet (81 mg total) by mouth daily. Swallow whole. 11/16/20  Yes Patrecia Pour, MD  clopidogrel (PLAVIX) 75 MG tablet Take 1 tablet (75 mg total) by mouth daily. 07/28/20  Yes Marcial Pacas, MD  fenofibrate 160 MG tablet Take 160 mg by mouth daily. 12/31/19  Yes [provider]  HUMALOG KWIKPEN 200 UNIT/ML KwikPen Inject 15 Units into the skin in the morning and at bedtime. 06/17/21  Yes [provider]  lamoTRIgine (LAMICTAL) 150 MG tablet Take 1 tablet (150 mg total) by mouth 2 (two) times daily. 07/28/20  Yes Marcial Pacas, MD  Lurasidone HCl (LATUDA) 60 MG TABS Take  1 tablet (60 mg total) by mouth daily with breakfast. 06/30/21  Yes Arfeen, Arlyce Harman, MD  metFORMIN (GLUCOPHAGE-XR) 500 MG 24 hr tablet Take 500 mg by mouth 2 (two) times daily. 08/24/18  Yes [provider]  morphine (MS CONTIN) 60 MG 12 hr tablet Take 60 mg by mouth 2 (two) times daily. Every 12 hours 06/12/19  Yes [provider]  morphine (MSIR) 15 MG tablet Take 15 mg by mouth 2 (two) times daily as needed. For breakthrough pain  (03-26-2020 per pt wife pt takes this twice also 06/11/19  Yes [provider]   omeprazole (PRILOSEC) 40 MG capsule Take 40 mg by mouth daily.   Yes [provider]  tamsulosin (FLOMAX) 0.4 MG CAPS capsule Take 0.8 mg by mouth at bedtime.   Yes [provider]  testosterone cypionate (DEPOTESTOSTERONE CYPIONATE) 200 MG/ML injection Inject 200 mg into the skin every 14 (fourteen) days. 07/03/21  Yes [provider]  traZODone (DESYREL) 100 MG tablet Take 1 tablet (100 mg total) by mouth at bedtime as needed for sleep. 06/30/21  Yes Arfeen, Arlyce Harman, MD  Vitamin D, Ergocalciferol, (DRISDOL) 1.25 MG (50000 UNIT) CAPS capsule Take 50,000 Units by mouth every Wednesday. 12/26/19  Yes [provider]  acetaminophen (TYLENOL) 325 MG tablet Take 2 tablets (650 mg total) by mouth every 6 (six) hours as needed. Patient not taking: Reported on 07/28/2021 05/29/21   Wynona Dove A, DO  Continuous Blood Gluc Sensor (FREESTYLE LIBRE 2 SENSOR) MISC 1 Device by Does not apply route every 14 (fourteen) days. Patient taking differently: 1 Device by Does not apply route every 14 (fourteen) days. Has not received- waiting on Insurance 02/25/20   Renato Shin, MD  Dulaglutide (TRULICITY) 4.5 ZO/1.0RU SOPN Inject 4.5 mg into the skin once a week. Patient not taking: Reported on 03/31/2021 01/22/20   Renato Shin, MD  insulin glargine, 2 Unit Dial, (TOUJEO MAX SOLOSTAR) 300 UNIT/ML Solostar Pen Inject 180 Units into the skin every morning. And pen needles 1/day Patient not taking: Reported on 07/28/2021 02/25/20   Renato Shin, MD  lidocaine (LIDODERM) 5 % Place 1 patch onto the skin daily as needed. Remove & Discard patch within 12 hours or as directed by MD Patient not taking: Reported on 07/28/2021 05/29/21   Jeanell Sparrow, DO  predniSONE (DELTASONE) 20 MG tablet Take three tablets once a day for one day, then take two tablets once a day for two days, then take one tablet once a day for 2 days. Patient not taking: Reported on 07/28/2021 06/07/21   Lajean Saver, MD   pregabalin (LYRICA) 225 MG capsule Take 1 capsule by mouth in the morning Patient taking differently: Take 225 mg by mouth See admin instructions. Pt takes 1 tablet in the morning and 2 tablets at night 03/01/21   Sarina Ill B, MD  rosuvastatin (CRESTOR) 10 MG tablet Take 1 tablet (10 mg total) by mouth daily. Patient not taking: Reported on 07/28/2021 11/16/20   Patrecia Pour, MD  venlafaxine XR (EFFEXOR-XR) 150 MG 24 hr capsule TAKE 1 CAPSULE BY MOUTH ONCE DAILY WITH BREAKFAST Patient taking differently: 150 mg 2 (two) times daily. 01/31/21   Marcial Pacas, MD      Allergies    Celebrex [celecoxib], Hydrocodone, and Sulfa antibiotics    Review of Systems   Review of Systems  Unable to perform ROS: Mental status change (Level 5 caveat)  Neurological:  Positive for seizures.  All other systems  reviewed and are negative.  Physical Exam Updated Vital Signs BP 107/69   Pulse 90   Temp 98.9 F (37.2 C) (Oral)   Resp 12   SpO2 97%  Physical Exam Vitals and nursing note reviewed.  Constitutional:      General: He is not in acute distress.    Appearance: He is not ill-appearing, toxic-appearing or diaphoretic.  HENT:     Head: Normocephalic and atraumatic.     Nose: Nose normal. No congestion.     Mouth/Throat:     Mouth: Mucous membranes are dry.     Pharynx: Oropharynx is clear.  Eyes:     Extraocular Movements: Extraocular movements intact.     Conjunctiva/sclera: Conjunctivae normal.     Pupils: Pupils are equal, round, and reactive to light.  Cardiovascular:     Rate and Rhythm: Normal rate and regular rhythm.  Pulmonary:     Effort: Pulmonary effort is normal.     Breath sounds: Wheezing present.  Abdominal:     General: Abdomen is flat. Bowel sounds are normal. There is no distension.     Palpations: Abdomen is soft.     Tenderness: There is no abdominal tenderness.  Musculoskeletal:     Cervical back: Normal range of motion and neck supple. No tenderness.  Skin:     General: Skin is warm and dry.     Capillary Refill: Capillary refill takes less than 2 seconds.  Neurological:     Mental Status: He is lethargic, disoriented and confused.     Cranial Nerves: Cranial nerves 2-12 are intact. No cranial nerve deficit.     Sensory: Sensory deficit present.     Motor: Motor function is intact. No weakness.     Coordination: Coordination is intact. Heel to [facility] Test normal.     Comments: Patient follows commands. Patient has decreased sensation to left-sided bilateral upper and lower extremities.  On chart review, it appears that this patient has a history of decreased sensation on left and has been evaluated by neurology.    ED Results / Procedures / Treatments   Labs (all labs ordered are listed, but only abnormal results are displayed) Labs Reviewed  BASIC METABOLIC PANEL - Abnormal; Notable for the following components:      Result Value   Glucose, Bld 411 (*)    Creatinine, Ser 1.85 (*)    GFR, Estimated 43 (*)    All other components within normal limits  ACETAMINOPHEN LEVEL - Abnormal; Notable for the following components:   Acetaminophen (Tylenol), Serum <10 (*)    All other components within normal limits  SALICYLATE LEVEL - Abnormal; Notable for the following components:   Salicylate Lvl <8.2 (*)    All other components within normal limits  AMMONIA - Abnormal; Notable for the following components:   Ammonia 36 (*)    All other components within normal limits  CBG MONITORING, ED - Abnormal; Notable for the following components:   Glucose-Capillary 111 (*)    All other components within normal limits  RESP PANEL BY RT-PCR (FLU A&B, COVID) ARPGX2  CBC  ETHANOL  RAPID URINE DRUG SCREEN, HOSP PERFORMED  LAMOTRIGINE LEVEL    EKG EKG Interpretation  Date/Time:  Thursday Jul 28 2021 06:47:49 EDT Ventricular Rate:  83 PR Interval:  173 QRS Duration: 94 QT Interval:  344 QTC Calculation: 405 R Axis:   140 Text Interpretation: Right  and left arm electrode reversal, interpretation assumes no reversal Sinus or ectopic atrial  rhythm Right axis deviation Abnormal T, consider ischemia, lateral leads Confirmed by Orpah Greek [phone]) on 07/28/2021 6:54:52 AM  Radiology DG Chest 1 View  Result Date: 07/28/2021 CLINICAL DATA:  Multiple seizures, possible morphine overdose, shortness of breath and wheezing EXAM: CHEST  1 VIEW COMPARISON:  June 06, 2021 FINDINGS: The heart size and mediastinal contours are within normal limits. Lung volumes are low. No focal consolidation, significant vascular congestion or pleural effusion seen. The visualized skeletal structures are unremarkable. IMPRESSION: No active disease. Electronically Signed   By: Frazier Richards M.D.   On: 07/28/2021 07:44   CT Head Wo Contrast  Result Date: 07/28/2021 CLINICAL DATA:  Head and neck trauma EXAM: CT HEAD WITHOUT CONTRAST CT CERVICAL SPINE WITHOUT CONTRAST TECHNIQUE: Multidetector CT imaging of the head and cervical spine was performed following the standard protocol without intravenous contrast. Multiplanar CT image reconstructions of the cervical spine were also generated. RADIATION DOSE REDUCTION: This exam was performed according to the departmental dose-optimization program which includes automated exposure control, adjustment of the mA and/or kV according to patient size and/or use of iterative reconstruction technique. COMPARISON:  Brain and cervical spine MRI and CT 06/06/2021 FINDINGS: Brain: There is no acute intracranial hemorrhage, extra-axial fluid collection, or acute infarct. Parenchymal volume is normal. The ventricles are normal in size. Gray-white differentiation is preserved. There is no mass lesion. There is no mass effect or midline shift. Vascular: No hyperdense vessel or unexpected calcification. Skull: Normal. Negative for fracture or focal lesion. Sinuses/Orbits: The imaged paranasal sinuses are clear. The mastoid air cells are clear. The  globes and orbits are unremarkable. Other: None. CT CERVICAL SPINE FINDINGS Alignment: Normal. There is no antero or retrolisthesis. There is no jumped or perched facets or other evidence of traumatic malalignment. Skull base and vertebrae: Skull base alignment is maintained. Vertebral body heights are preserved. There is no evidence of acute fracture. There is no suspicious osseous lesion. Soft tissues and spinal canal: No prevertebral fluid or swelling. No visible canal hematoma. Disc levels: There is ankylosis of the left C3-C4 facet joint. There is mild degenerative change in the lower cervical spine without significant spinal canal or neural foraminal stenosis. Upper chest: The imaged lung apices are clear. Other: None. IMPRESSION: 1. No acute intracranial pathology. 2. No acute fracture or traumatic malalignment of the cervical spine. Electronically Signed   By: Valetta Mole M.D.   On: 07/28/2021 09:24   CT Cervical Spine Wo Contrast  Result Date: 07/28/2021 CLINICAL DATA:  Head and neck trauma EXAM: CT HEAD WITHOUT CONTRAST CT CERVICAL SPINE WITHOUT CONTRAST TECHNIQUE: Multidetector CT imaging of the head and cervical spine was performed following the standard protocol without intravenous contrast. Multiplanar CT image reconstructions of the cervical spine were also generated. RADIATION DOSE REDUCTION: This exam was performed according to the departmental dose-optimization program which includes automated exposure control, adjustment of the mA and/or kV according to patient size and/or use of iterative reconstruction technique. COMPARISON:  Brain and cervical spine MRI and CT 06/06/2021 FINDINGS: Brain: There is no acute intracranial hemorrhage, extra-axial fluid collection, or acute infarct. Parenchymal volume is normal. The ventricles are normal in size. Gray-white differentiation is preserved. There is no mass lesion. There is no mass effect or midline shift. Vascular: No hyperdense vessel or  unexpected calcification. Skull: Normal. Negative for fracture or focal lesion. Sinuses/Orbits: The imaged paranasal sinuses are clear. The mastoid air cells are clear. The globes and orbits are unremarkable. Other: None. CT CERVICAL SPINE  FINDINGS Alignment: Normal. There is no antero or retrolisthesis. There is no jumped or perched facets or other evidence of traumatic malalignment. Skull base and vertebrae: Skull base alignment is maintained. Vertebral body heights are preserved. There is no evidence of acute fracture. There is no suspicious osseous lesion. Soft tissues and spinal canal: No prevertebral fluid or swelling. No visible canal hematoma. Disc levels: There is ankylosis of the left C3-C4 facet joint. There is mild degenerative change in the lower cervical spine without significant spinal canal or neural foraminal stenosis. Upper chest: The imaged lung apices are clear. Other: None. IMPRESSION: 1. No acute intracranial pathology. 2. No acute fracture or traumatic malalignment of the cervical spine. Electronically Signed   By: Valetta Mole M.D.   On: 07/28/2021 09:24    Procedures Procedures    Medications Ordered in ED Medications  albuterol (VENTOLIN HFA) 108 (90 Base) MCG/ACT inhaler 2 puff (2 puffs Inhalation Given 07/28/21 0911)  sodium chloride 0.9 % bolus 1,000 mL (0 mLs Intravenous Stopped 07/28/21 0943)  sodium chloride 0.9 % bolus 1,000 mL (1,000 mLs Intravenous New Bag/Given 07/28/21 1354)  LORazepam (ATIVAN) injection 1 mg (1 mg Intravenous Given 07/28/21 1437)    ED Course/ Medical Decision Making/ A&P                           Medical Decision Making Amount and/or Complexity of Data Reviewed Labs: ordered. Radiology: ordered.  Risk Prescription drug management.   56 year old male presents to ED for evaluation.  Please see HPI for further details.  On initial examination, the patient is postictal.  The patient is alert and oriented x2, cannot tell me what year it is,  cannot tell me what state we are in.  Patient unsure of events surrounding him being brought to the hospital.  Patient is nontachycardic, nonhypoxic.  Patient is afebrile.  Patient has diffuse wheezing in bilateral upper lung fields, patient has diagnosis of COPD and still smokes.  Patient currently on 2 L of oxygen via nasal cannula due to the fact this patient's oxygen saturation will balance between 98% and 88%.  Patient neurological examination shows decreased sensation to the left side however on chart review it appears that this patient has a history of decrease sensation to the left which he has been worked up for by neurology.  Patient able to follow commands.  Patient worked up utilizing the following labs and imaging studies interpreted by me personally: - Ammonia slightly elevated at 36 however patient denies any abdominal pain, there is no asterixis on examination - Acetaminophen level unremarkable - Salicylate level unremarkable - Respiratory panel negative - Ethanol unremarkable - CBC unremarkable, no elevated white blood cell count - BMP shows elevated glucose to 411, patient wife states that the patient typically has elevated glucose readings between 304 100 as he is an uncontrolled diabetic.  Patient also has increased creatinine to 1.5 however on chart review this is the patient baseline, patient given 1 L fluid - Chest x-ray shows no active cardiopulmonary disease, patient treated with albuterol inhaler - CT head and CT C-spine showed no acute intracranial abnormality, fracture, subluxation of C-spine.  I agree with radiologist interpretation.  Patient continues to be altered, confused.  Patient seems to be responding to some kind of internal stimuli, possibly hallucinating.  We will contact medicine for admission for further management as this patient cannot be medically cleared in his current state.    Dr. Tamala Julian,  hospitalist, has agreed to admit the patient for further management.   Dr. Tamala Julian requested that we IVC this patient prior to admission, IVC paperwork has been filled out.  Doctors with requested that we contact neurology and get them involved in patient care.  Neurology has been consulted, awaiting callback.   Final Clinical Impression(s) / ED Diagnoses Final diagnoses:  Seizure ([place])  Altered mental status, unspecified altered mental status type    Rx / DC Orders ED Discharge Orders     None         Lawana Chambers 07/28/21 1608    Dorie Rank, MD 07/29/21 [phone]

## 2021-07-28 NOTE — BH Assessment (Signed)
Pt not medically cleared at time of TTS consult. Per EDP possible medical admit for observation.

## 2021-07-28 NOTE — ED Notes (Signed)
Provider at bedside

## 2021-07-28 NOTE — ED Triage Notes (Signed)
BIB [facility] from home. Pt has a hx of seizures. Pt was going around making statements about hurting himself saying goodbye to everyone and telling everyone that he loves them. Wife and him in separate rooms. Wife heard a loud thud and went into the kitchen and found him on the floor unresponsive. Pt takes morphine for chronic pain. Pt admitted to taking morphine unknown amount. Wife gave pt Narcan '8mg'$  IN. Pt was unresponsive on EMS arrival in EMS presence pt had a witnessed [place] seizure that lasted approximately 1-2 min. Pt received Versed '5mg'$  IM. Slowed seizure down and pt was post-ictal but talking. Once in the truck pt had a 2nd [employer] seizure that lasted approximately 1-2 min. Pt received second dose of Versed 2.'5mg'$ . Pt went post ictal however he would answer a few question. Pt Post-ictal on arrival to ED, not able to answer questions.

## 2021-07-28 NOTE — ED Provider Notes (Signed)
I provided a substantive portion of the care of this patient.  I personally performed the entirety of the exam and medical decision making for this encounter.  EKG Interpretation  Date/Time:  Thursday Jul 28 2021 06:47:49 EDT Ventricular Rate:  83 PR Interval:  173 QRS Duration: 94 QT Interval:  344 QTC Calculation: 405 R Axis:   140 Text Interpretation: Right and left arm electrode reversal, interpretation assumes no reversal Sinus or ectopic atrial rhythm Right axis deviation Abnormal T, consider ischemia, lateral leads Confirmed by Orpah Greek [phone]) on 07/28/2021 6:54:52 AM  Patient presented for evaluation of seizures, overdose, confusion.  Patient answering question but has remained persistently confused.  No signs of infection.  No acute abnormalities on head CT.  Possibly prolonged postictal state.  Patient placed under IVC.  Not medically for psych eval.  Will consult medical service and neurology for further evaluation, admission   Dorie Rank, MD 07/28/21 [phone]

## 2021-07-29 DIAGNOSIS — F319 Bipolar disorder, unspecified: Secondary | ICD-10-CM | POA: Diagnosis not present

## 2021-07-29 DIAGNOSIS — G8929 Other chronic pain: Secondary | ICD-10-CM | POA: Diagnosis not present

## 2021-07-29 DIAGNOSIS — F3181 Bipolar II disorder: Secondary | ICD-10-CM | POA: Diagnosis not present

## 2021-07-29 DIAGNOSIS — G9341 Metabolic encephalopathy: Secondary | ICD-10-CM | POA: Diagnosis not present

## 2021-07-29 DIAGNOSIS — T50902A Poisoning by unspecified drugs, medicaments and biological substances, intentional self-harm, initial encounter: Secondary | ICD-10-CM | POA: Diagnosis not present

## 2021-07-29 DIAGNOSIS — G40919 Epilepsy, unspecified, intractable, without status epilepticus: Secondary | ICD-10-CM | POA: Diagnosis not present

## 2021-07-29 LAB — LAMOTRIGINE LEVEL: Lamotrigine Lvl: 1.4 ug/mL — ABNORMAL LOW (ref 2.0–20.0)

## 2021-07-29 LAB — GLUCOSE, CAPILLARY
Glucose-Capillary: 146 mg/dL — ABNORMAL HIGH (ref 70–99)
Glucose-Capillary: 188 mg/dL — ABNORMAL HIGH (ref 70–99)
Glucose-Capillary: 196 mg/dL — ABNORMAL HIGH (ref 70–99)
Glucose-Capillary: 206 mg/dL — ABNORMAL HIGH (ref 70–99)
Glucose-Capillary: 254 mg/dL — ABNORMAL HIGH (ref 70–99)
Glucose-Capillary: 271 mg/dL — ABNORMAL HIGH (ref 70–99)

## 2021-07-29 MED ORDER — ZIPRASIDONE MESYLATE 20 MG IM SOLR: 20.0000 mg | INTRAMUSCULAR | Status: DC | PRN

## 2021-07-29 MED ORDER — LORAZEPAM 1 MG PO TABS: 1.0000 mg | ORAL_TABLET | ORAL | Status: DC | PRN

## 2021-07-29 MED ORDER — MORPHINE SULFATE 15 MG PO TABS
15.0000 mg | ORAL_TABLET | Freq: Four times a day (QID) | ORAL | Status: DC | PRN
  Administered 2021-07-29: 15 mg via ORAL
  Filled 2021-07-29: qty 1

## 2021-07-29 MED ORDER — OLANZAPINE 5 MG PO TBDP: 10.0000 mg | ORAL_TABLET | Freq: Three times a day (TID) | ORAL | Status: DC | PRN

## 2021-07-29 NOTE — Progress Notes (Signed)
Inpatient Diabetes Program Recommendations  AACE/ADA: New Consensus Statement on Inpatient Glycemic Control (2015)  Target Ranges:  Prepandial:   less than 140 mg/dL      Peak postprandial:   less than 180 mg/dL (1-2 hours)      Critically ill patients:  140 - 180 mg/dL   Lab Results  Component Value Date   GLUCAP 196 (H) 07/29/2021   HGBA1C 14.1 (H) 07/28/2021    Review of Glycemic Control  Diabetes history: type 2 Outpatient Diabetes medications: Humalog 15 units am & HS, Metformin 500 mg BID  Current orders for Inpatient glycemic control: Novolog 0-15 units correction scale TID, Novolog 15 units BID   Inpatient Diabetes Program Recommendations:   Spoke with patient at the bedside. Patient states that he has had diabetes for 20-25 years, no PCP. HE states that he had not had any insulin for about 2 days. Reviewed his HgbA1C of 14.1%. States that he had been taking the insulin before he came in. He stated that he had not had any insulin here in the hospital. He was very agitated with his wife at the bedside. Not sure that he was able to understand what I was asking. Told him that we were just trying to help him.   Will continue to monitor blood sugars while in the hospital.  Harvel Ricks RN BSN CDE Diabetes Coordinator Pager: [phone]  8am-5pm

## 2021-07-29 NOTE — Assessment & Plan Note (Addendum)
-   Continue Latuda, venlafaxine - Consult Psychiatry

## 2021-07-29 NOTE — Assessment & Plan Note (Signed)
EMS observed a seizure en route. This was likely provoked in the setting of medication nonadherence and Narcan reversal. - Continue Lamictal when willing to take

## 2021-07-29 NOTE — Hospital Course (Addendum)
Vincent [name] is a 56 y.o. M with HTN, CAD, hx TIA/CVA, COPD/smoking, Bipolar disorder, chronic pain on opiates, bladder cancer and epilepsy who presented with altered mental status.  Evidently, wife heard something in his room early in the morning, found him supine on the floor.  Called EMS, started CPR momentarily then he started stirring, then administered 2 Narcans (unclear if he responded to the first, but after the second he appeared to have a GTC seizure).  In the ER, woke enough to explain that he had taken an overdose of morphine in order to harm himself.

## 2021-07-29 NOTE — Progress Notes (Signed)
Patient wouldn't allow any assessments, tests, labs, and medications except for his evening meal insulin coverage and pain medication. Both medical Md and Psych Md were notified.

## 2021-07-29 NOTE — Assessment & Plan Note (Signed)
Patient currently refusing insulins and treatment. No clinical signs of DKA at this time. - Attempt BMP tomorrow if able - Resume insulin when willing

## 2021-07-29 NOTE — Progress Notes (Signed)
Patient's wife returned with the patient's phone with a text to her stating, "I will be dead tomorrow. I want you to know much I love you. Your were always there when nobody else was. I want you to have my truck. I love you so much good by"

## 2021-07-29 NOTE — Progress Notes (Signed)
Patient refused EKG. RN notified

## 2021-07-29 NOTE — Assessment & Plan Note (Signed)
At baseline patient without significant cognitive impairment.  At present, he is oriented to self, wife, and hospital, but not year, month.  When asked to specify which hospital, his response implies that we are trying to claim this is [facility] but he can't be sure we are to be trusted.  Suspect this is some residual post-ictal encephalopathy.  Overdose of MS contin may also be playing a role and hyperglycemia.  I'm not certain if he is having mania.  No suspicion for infection, seizures, stroke.  - Consult Psychiatry - Resume AEDs when he is cooperative

## 2021-07-29 NOTE — Progress Notes (Signed)
Progress Note   Patient: Vincent [name] WUJ:[id number] DOB: [birth year] DOA: 07/28/2021     0 DOS: the patient was seen and examined on 07/29/2021 at 9:40AM      Brief hospital course: Mr. Maynes is a 56 y.o. M with HTN, CAD, hx TIA/CVA, COPD/smoking, Bipolar disorder, chronic pain on opiates, bladder cancer and epilepsy who presented with altered mental status.  Evidently, wife heard something in his room early in the morning, found him supine on the floor.  Called EMS, started CPR momentarily then he started stirring, then administered 2 Narcans (unclear if he responded to the first, but after the second he appeared to have a GTC seizure).  In the ER, woke enough to explain that he had taken an overdose of morphine in order to harm himself.     Assessment and Plan: * Acute metabolic encephalopathy At baseline patient without significant cognitive impairment.  At present, he is oriented to self, wife, and hospital, but not year, month.  When asked to specify which hospital, his response implies that we are trying to claim this is [facility] but he can't be sure we are to be trusted.  Suspect this is some residual post-ictal encephalopathy.  Overdose of MS contin may also be playing a role and hyperglycemia.  I'm not certain if he is having mania.  No suspicion for infection, seizures, stroke.  - Consult Psychiatry - Resume AEDs when he is cooperative  Suicide attempt by drug overdose [facility]) Wife confirms to me in the hall that he stated he was trying to harm himself with a morphine overdose (takes MS contin 60 mg BID at home).  Wife tells me separately that she is afraid for his safety and her own at home. - IVC - Consult Psychiatry  Hyperglycemia due to type 2 diabetes mellitus ([place]) Patient currently refusing insulins and treatment. No clinical signs of DKA at this time. - Attempt BMP tomorrow if able - Resume insulin when willing  Breakthrough seizure ([place]) EMS observed a  seizure en route. This was likely provoked in the setting of medication nonadherence and Narcan reversal. - Continue Lamictal when willing to take  Stage 3b chronic kidney disease (CKD) ([place]) AKI ruled out.  Baseline Cr appears to be around 1.5-1.8.  Currently in that range.  Chronic pain Given concern for overdose, will be cautious with resumption.  To avert withdrawal, will start with IR at lower dose - Resume morphine as PRN IR at lower dose for now, resume MS contin based on response - Continue Lyrica, venlafaxine if willing to take  Bipolar depression ([place]) - Continue Latuda, venlafaxine - Consult Psychiatry          Subjective: The patient is irritable, and hostile.  He refuses to answer questions.  He implies that we are claiming this is: Hospital when it might not be, and he refuses physical examination because he does not want me to get my "DNA on him".     Physical Exam: Vitals:   07/28/21 2347 07/29/21 0438 07/29/21 0817 07/29/21 1143  BP: 133/68 126/65 (!) 153/78 (!) 177/77  Pulse: 86 91 92 (!) 106  Resp: '16 16 18 18  '$ Temp: 99.3 F (37.4 C) 98.9 F (37.2 C) 98.4 F (36.9 C) 98.5 F (36.9 C)  TempSrc: Oral Oral Oral Oral  SpO2: 99% 99% 99% 97%   Adult male, lying in bed, old craniotomy scar visible, nasal cannula in place, makes eye contact but irritable Refuses physical exam, face symmetric,  speech fluent and somewhat rapid, irritable affect, some paranoid statements as above.      Data Reviewed: CT head report reviewed, unremarkable.  Nursing notes and vital signs reviewed. Venous blood gas, HIV, ammonia, salicylate, acetaminophen levels reviewed. COVID test reviewed CK, A1c, alcohol level, BMP, and CBC reviewed.  Family Communication: Wife    Disposition: Status is: Observation The patient was admitted with acute metabolic encephalopathy.  This is probably some postictal psychosis, possibly compounded by mania.  Regardless in the setting  of his suicide attempt, he will need evaluation by psychiatry, and adjudication of safety to return home versus inpatient psychiatry based on that             Author: Edwin Dada, MD 07/29/2021 1:35 PM  For on call review [email or web address].

## 2021-07-29 NOTE — Assessment & Plan Note (Signed)
AKI ruled out.  Baseline Cr appears to be around 1.5-1.8.  Currently in that range.

## 2021-07-29 NOTE — Assessment & Plan Note (Signed)
Wife confirms to me in the hall that he stated he was trying to harm himself with a morphine overdose (takes MS contin 60 mg BID at home).  Wife tells me separately that she is afraid for his safety and her own at home. - IVC - Consult Psychiatry

## 2021-07-29 NOTE — Assessment & Plan Note (Addendum)
Given concern for overdose, will be cautious with resumption.  To avert withdrawal, will start with IR at lower dose - Resume morphine as PRN IR at lower dose for now, resume MS contin based on response - Continue Lyrica, venlafaxine if willing to take

## 2021-07-29 NOTE — Consult Note (Signed)
[facility] Face-to-Face Psychiatry Consult   Reason for Consult:  Suicidal ideation Referring Physician:  Dr. Loleta Books  Patient Identification: Vincent [name] MRN:  [id number] Principal Diagnosis: Acute metabolic encephalopathy Diagnosis:  Principal Problem:   Acute metabolic encephalopathy Active Problems:   Bipolar depression ([place])   Bipolar 2 disorder ([place])   Chronic pain   Breakthrough seizure ([place])   Hyperglycemia due to type 2 diabetes mellitus ([place])   Stage 3b chronic kidney disease (CKD) ([place])   Suicide attempt by drug overdose ([place])   Total Time spent with patient: 1 hour  Subjective:   Vincent [name] is a 56 y.o. male patient admitted with seizure and altered mental status.  Patient is very vague and not forthcoming with details regarding information prior to this admission.  His wife Vincent [name] is present at bedside, in forwards details surrounding yesterday's events.  Consent is obtained to speak with, obtain collateral information, and conduct psychiatric evaluation in her presence.  Patient's wife corroborates story listed below, citing finding her husband on the kitchen floor slumped over.  She states patient did admit to an overdose, however this seems to be conflicting information and stories.  Patient reports taking (6) 81 mg aspirin tablets.  His wife states" he has told different people different things.  He states he took 50-60 aspirin.  He also stated he took all of his pink pills.  Which I am unable to find or locate the bottle.  He is not a text message to his sister in summary saying I cannot deal with this.  I am tired by the morning going to be dead."  Patient does describe his mood on Wednesday as grumpy.  According to his wife, he is normally grumpy and this was nothing out of the norm for him.  Patient endorses previous psychiatric history of bipolar and PTSD, currently taking Latuda and trazodone.  Patient's reports previous history of suicidal thoughts, that  resulted in brief admission to [facility].  Chart review shows that this most likely was a observation overnight in which patient was IVC for endorsing suicidal thoughts, psychiatrically cleared next day.  Patient denies any history of illicit substance use, endorses daily use of nicotine.  He denies any current and or active legal charges.  He does endorse 2 losses of close family members that include father-in-law and uncle.  He endorses having a family history of mental illness to include father with undiagnosed severe depression and maternal aunt with depression.  Patient denies family history of suicide attempt, however wife endorses niece attempted suicide by overdose and cutting.  He currently denies any suicidal ideations, homicidal ideations, and or auditory or visual hallucinations.  Today upon evaluation patient is appropriate and alert, he initially presented well and showing much willingness to engage prior to becoming agitated and angered.  He does acknowledge my presence, and engages well with me. He responses are normal and speech is pressured, volume is increased at times and continues to increase throughtout the psychiatric reassessment.  He endorses a history of worsening agitation, behavioral disturbances, for about years months, in which he relates his bipolar disorder.  He denies any other psychosocial stressors and or triggers that could be contributing to his level of agitation.  He does appear to have some insight, and improved judgment as it relates to obtaining information and details surrounding suspected suicide attempt.   Psychoeducation was performed, and discussed patient being compliant with his Latuda and Lamictal, as it will help with  his mood stabilization, agitation, and aggression in addition to seizure disorder.  Patient is currently refusing treatment to include labs.  At times during the psychiatric evaluation he does become very threatening, although he  remained in the bed and he did not make any threatening gestures towards myself, nursing staff, and or his wife.  He does feel he is being held against his will, despite current events leading up to his admission which include a suspected suicide attempt by overdose on pain medication.  As noted above he was unable to identify factors that led to his admission, and felt as if he was being targeted for asking questions about financial status, marital issues, family dysfunction.  On today's evaluation he denies any suicidal ideations and is able to contract for safety.  He also denies any threat or intent to harm anyone else, again is able to contract for safety.  He has exhibited and displayed behavior disturbances, agitation, and aggression during this admission.  He currently denies any depression or anxiety.  He denies any suicidal thoughts, homicidal thoughts, and or auditory visual hallucinations.  He does not appear to be responding to internal stimuli and or external stimuli.  Currently recommend inpatient psychiatric admission.  Patient does present to be a danger to self at this time with information provided, will continue to uphold involuntary commitment.   HPI:  Vincent [name] is a 56 y.o. male with medical history significant of hypertension, hyperlipidemia, COPD, poorly controlled diabetes mellitus type 2, hypothyroidism,bladder cancer, rheumatoid arthritis, benign essential tremor, anxiety, bipolar 2 disorder, and history of seizure on lamotrigine who presents after being noted to be acutely altered.  History is obtained from his wife over the phone as the patient is currently not coherent enough to give any history.  This morning at around 5:30 AM she had heard noises for which she thought it was the cat initially, but when she went into the kitchen found her husband laying on his back on the floor saying " help me I am dying".  She reported that he also told her to tell Ovid Curd their granddaughter  that he loved her.  She tried to help him up, but he became unresponsive and went limp.  She called 911.  During this time she noticed that his eating was shallow, and it seemed as though he had sonorous respirations.  Due to the concern for agonal breathing his wife was instructed to start CPR which she did, but only did 1 compression before the patient started moaning and was instructed to stop.  Normal 1 had been instructed to give him a dose of Narcan intranasally due to the patient being on chronic pain medications of morphine.  After giving him first dose of Narcan she did not notice any significant change in his symptoms.  911 instructed her to give a second dose of Narcan a couple minutes later which she did.  After the second dose she states that patient had been noted to have shaking of his upper extremities.   His wife notes that at some point he had told her that he was trying to harm himself.  He reported taking the whole bottle of his morphine which she reports was last filled 4 days ago.  She reported that at one point he also stated that this attempt failed, but next time wont.  Patient wife reports that he has been compliant with his Lamictal.   After EMS arrival he was noted to have 2 grand mal  seizures lasting 1 to 2 minutes each in route for which he was given 7.5 mg of Versed.  Past Psychiatric History: Bipolar and PTSD. Under the care of Dr. Adele Schilder. Currently prescribed Latuda and Trazodone. Last visit 06/30/2021.  Risk to Self:  Denies Risk to Others:  Denies Prior Inpatient Therapy:   [facility]  Prior Outpatient Therapy:    Past Medical History:  Past Medical History:  Diagnosis Date   Anxiety    Benign essential tremor    Bipolar 2 disorder ([place])    followed by [facility]--- dr s. Adele Schilder   Bladder cancer [facility])    recurrent   CAD (coronary artery disease)    cardiac cath 2003  and 2011 both showed normal coronary arteries w/ preserved lvf;  Non obstructive on CTA Oct 2019.    Chronic  pain syndrome    back---- [facility] [place]   Cold extremities    BLE   COPD (chronic obstructive pulmonary disease) ([place])    DDD (degenerative disc disease), lumbar    Diabetic peripheral neuropathy ([place])    Gastroparesis    followed by dr Henrene Pastor   GERD (gastroesophageal reflux disease)    Hiatal hernia    History of bladder cancer urologist-  previously dr Consuella Lose;  now dr gay   papillay TCC (Ta G1)  s/p TURBT and chemo instillation 2014   History of chest pain 12/2017   heart cath normal   History of encephalopathy 05/27/2015   admission w/ acute encephalopathy thought to be secondary to pain meds and COPD   History of gastric ulcer    History of Helicobacter pylori infection    History of kidney stones    History of TIA (transient ischemic attack) 2008  and 10-19-2018    no residual's   History of traumatic head injury 2010   w/ LOC  per pt needed stitches, hit in head with a mower blade   Hyperlipidemia    Hypertension    Hypogonadism male    s/p  bilateral orchiectomy   Hypothyroidism    Insomnia    Mild obstructive sleep apnea    study in epic 12-04-2016, no cpap   PTSD (post-traumatic stress disorder)    chronic   PTSD (post-traumatic stress disorder)    RA (rheumatoid arthritis) ([place])    followed by [facility]   Seizures, transient [facility]) neurologist-  dr Krista Blue--  differential dx complex partial seizure .vs.  mood disorder .vs.  pseudoseizure--  negative EEG's   confusion episodes and staring spells since 11/ 2015   (03-26-2020 per pt wife last seizure 10 /2021)   Transient confusion NEUOROLOGIST-  DR Krista Blue   Episodes since 11/ 2015--  neurologist dx  differential complex partial seizure  .vs. mood disorder . vs. pseudoseizure   Type 2 diabetes mellitus treated with insulin [facility])    endocrinologist--- dr Loanne Drilling---  (03-26-2020 pt does not check blood sugar at home)    Past Surgical History:  Procedure Laterality Date   AMPUTATION  Left 04/28/2020   Procedure: LEFT LITTLE FINGER AMPUTATION;  Surgeon: Newt Minion, MD;  Location: [place];  Service: Orthopedics;  Laterality: Left;   CARDIAC CATHETERIZATION  12-27-2001  DR Einar Gip  &  05-26-2009  DR Irish Lack   RESULTS FOR BOTH ARE NORMAL CORONARIES AND PERSERVED LVF/ EF 60%   CARPAL TUNNEL RELEASE Bilateral right 09-16-2003;  left ?   CARPAL TUNNEL RELEASE Left 02/25/2015   Procedure: LEFT CARPAL TUNNEL RELEASE;  Surgeon: Leanora Cover, MD;  Location: [place];  Service: Orthopedics;  Laterality: Left;   CYSTOSCOPY N/A 10/10/2012   Procedure: CYSTOSCOPY CLOT EVACUATION FULGERATION OF BLEEDERS ;  Surgeon: Claybon Jabs, MD;  Location: [facility];  Service: Urology;  Laterality: N/A;   CYSTOSCOPY WITH BIOPSY N/A 11/26/2015   Procedure: CYSTOSCOPY WITH BIOPSY AND FULGURATION;  Surgeon: Kathie Rhodes, MD;  Location: [place];  Service: Urology;  Laterality: N/A;   ESOPHAGOGASTRODUODENOSCOPY  2014   LAPAROSCOPIC CHOLECYSTECTOMY  11-17-2010   ORCHIECTOMY Right 02/21/2016   Procedure: SCROTAL ORCHIECTOMY with TESTICULAR PROSTHESIS IMPLANT;  Surgeon: Kathie Rhodes, MD;  Location: [facility];  Service: Urology;  Laterality: Right;   ORCHIECTOMY Left 09/02/2018   Procedure: ORCHIECTOMY;  Surgeon: Kathie Rhodes, MD;  Location: [facility];  Service: Urology;  Laterality: Left;   ROTATOR CUFF REPAIR Right 12/2004   TRANSURETHRAL RESECTION OF BLADDER TUMOR N/A 08/09/2012   Procedure: TRANSURETHRAL RESECTION OF BLADDER TUMOR (TURBT) WITH GYRUS WITH MITOMYCIN C;  Surgeon: Claybon Jabs, MD;  Location[facility];  Service: Urology;  Laterality: N/A;   TRANSURETHRAL RESECTION OF BLADDER TUMOR N/A 03/29/2020   Procedure: TRANSURETHRAL RESECTION OF BLADDER TUMOR (TURBT) and post-op instillation of gemcitabine;  Surgeon: Janith Lima, MD;  Location: [facility];  Service: Urology;   Laterality: N/A;   TRANSURETHRAL RESECTION OF BLADDER TUMOR WITH GYRUS (TURBT-GYRUS) N/A 02/27/2014   Procedure: TRANSURETHRAL RESECTION OF BLADDER TUMOR WITH GYRUS (TURBT-GYRUS);  Surgeon: Claybon Jabs, MD;  Location: [facility];  Service: Urology;  Laterality: N/A;   Family History:  Family History  Problem Relation Age of Onset   Diabetes Mother    Diabetes Father    Hypertension Father    Heart attack Father 43       died age 70   Alcohol abuse Father    Colon cancer Neg Hx    Esophageal cancer Neg Hx    Stomach cancer Neg Hx    Rectal cancer Neg Hx    Family Psychiatric  History: As per patient " Father undiagnosed with depression; maternal aunt history of depression.  Niece with history of suicide attempt by overdose and cutting." Social History:  Social History   Substance and Sexual Activity  Alcohol Use No     Social History   Substance and Sexual Activity  Drug Use No    Social History   Socioeconomic History   Marital status: Married    Spouse name: Not on file   Number of children: 3   Years of education: GED   Highest education level: Not on file  Occupational History   Occupation: Engineer, technical sales    Comment: Owner of company  Tobacco Use   Smoking status: Every Day    Packs/day: 1.50    Years: 38.00    Pack years: 57.00    Types: Cigarettes   Smokeless tobacco: Never  Vaping Use   Vaping Use: Never used  Substance and Sexual Activity   Alcohol use: No   Drug use: No   Sexual activity: Not Currently    Partners: Female    Birth control/protection: None  Other Topics Concern   Not on file  Social History Narrative   Lives at home with his wife and children.   Left-handed.   3-4 cups caffeine per day.   Social Determinants of Health   Financial Resource Strain: Not on file  Food Insecurity: Not on file  Transportation Needs: Not on file  Physical Activity: Not on file  Stress: Not on file  Social  Connections: Not on file   Additional Social History:    Allergies:   Allergies  Allergen Reactions   Celebrex [Celecoxib] Anaphylaxis and Rash   Hydrocodone Rash and Other (See Comments)    "blisters developed on arms"   Sulfa Antibiotics Rash    Labs:  Results for orders placed or performed during the hospital encounter of 07/28/21 (from the past 48 hour(s))  CBC     Status: None   Collection Time: 07/28/21  6:46 AM  Result Value Ref Range   WBC 6.9 4.0 - 10.5 K/uL   RBC 5.31 4.22 - 5.81 MIL/uL   Hemoglobin 14.5 13.0 - 17.0 g/dL   HCT 44.9 39.0 - 52.0 %   MCV 84.6 80.0 - 100.0 fL   MCH 27.3 26.0 - 34.0 pg   MCHC 32.3 30.0 - 36.0 g/dL   RDW 13.2 11.5 - 15.5 %   Platelets 208 150 - 400 K/uL   nRBC 0.0 0.0 - 0.2 %    Comment: Performed at [facility], [place] [address], [place], [place] [address]  Basic metabolic panel     Status: Abnormal   Collection Time: 07/28/21  6:46 AM  Result Value Ref Range   Sodium 139 135 - 145 mmol/L   Potassium 4.2 3.5 - 5.1 mmol/L   Chloride 105 98 - 111 mmol/L   CO2 26 22 - 32 mmol/L   Glucose, Bld 411 (H) 70 - 99 mg/dL    Comment: Glucose reference range applies only to samples taken after fasting for at least 8 hours.   BUN 20 6 - 20 mg/dL   Creatinine, Ser 1.85 (H) 0.61 - 1.24 mg/dL   Calcium 8.9 8.9 - 10.3 mg/dL   GFR, Estimated 43 (L) >60 mL/min    Comment: (NOTE) Calculated using the CKD-EPI Creatinine Equation (2021)    Anion gap 8 5 - 15    Comment: Performed at [place] [address] [place], [place] [address]  Ethanol     Status: None   Collection Time: 07/28/21  6:46 AM  Result Value Ref Range   Alcohol, Ethyl (B) <10 <10 mg/dL    Comment: (NOTE) Lowest detectable limit for serum alcohol is 10 mg/dL.  For medical purposes only. Performed at [facility], [place] [address] [place], [place] [address]   Hemoglobin A1c     Status: Abnormal   Collection Time: 07/28/21  6:46 AM  Result Value Ref  Range   Hgb A1c MFr Bld 14.1 (H) 4.8 - 5.6 %    Comment: (NOTE) Pre diabetes:          5.7%-6.4%  Diabetes:              >6.4%  Glycemic control for   <7.0% adults with diabetes    Mean Plasma Glucose 357.97 mg/dL    Comment: Performed at [facility] [address], [place], [place] [address]  CK     Status: None   Collection Time: 07/28/21  6:46 AM  Result Value Ref Range   Total CK 371 49 - 397 U/L    Comment: Performed at [facility], [place] [address] [place],  [address]  POC CBG, ED     Status: Abnormal   Collection Time: 07/28/21  7:41 AM  Result Value  Ref Range   Glucose-Capillary 111 (H) 70 - 99 mg/dL    Comment: Glucose reference range applies only to samples taken after fasting for at least 8 hours.  Resp Panel by RT-PCR (Flu A&B, Covid) Nasopharyngeal Swab     Status: None   Collection Time: 07/28/21  9:39 AM   Specimen: Nasopharyngeal Swab; Nasopharyngeal(NP) swabs in vial transport medium  Result Value Ref Range   SARS Coronavirus 2 by RT PCR NEGATIVE NEGATIVE    Comment: (NOTE) SARS-CoV-2 target nucleic acids are NOT DETECTED.  The SARS-CoV-2 RNA is generally detectable in upper respiratory specimens during the acute phase of infection. The lowest concentration of SARS-CoV-2 viral copies this assay can detect is 138 copies/mL. A negative result does not preclude SARS-Cov-2 infection and should not be used as the sole basis for treatment or other patient management decisions. A negative result may occur with  improper specimen collection/handling, submission of specimen other than nasopharyngeal swab, presence of viral mutation(s) within the areas targeted by this assay, and inadequate number of viral copies(<138 copies/mL). A negative result must be combined with clinical observations, patient history, and epidemiological information. The expected result is Negative.  Fact Sheet for Patients:   EntrepreneurPulse.com.au  Fact Sheet for Healthcare Providers:  IncredibleEmployment.be  This test is no t yet approved or cleared by the [place] FDA and  has been authorized for detection and/or diagnosis of SARS-CoV-2 by FDA under an Emergency Use Authorization (EUA). This EUA will remain  in effect (meaning this test can be used) for the duration of the COVID-19 declaration under Section 564(b)(1) of the Act, 21 U.S.C.section 360bbb-3(b)(1), unless the authorization is terminated  or revoked sooner.       Influenza A by PCR NEGATIVE NEGATIVE   Influenza B by PCR NEGATIVE NEGATIVE    Comment: (NOTE) The Xpert Xpress SARS-CoV-2/FLU/RSV plus assay is intended as an aid in the diagnosis of influenza from Nasopharyngeal swab specimens and should not be used as a sole basis for treatment. Nasal washings and aspirates are unacceptable for Xpert Xpress SARS-CoV-2/FLU/RSV testing.  Fact Sheet for Patients: EntrepreneurPulse.com.au  Fact Sheet for Healthcare Providers: IncredibleEmployment.be  This test is not yet approved or cleared by the [place] FDA and has been authorized for detection and/or diagnosis of SARS-CoV-2 by FDA under an Emergency Use Authorization (EUA). This EUA will remain in effect (meaning this test can be used) for the duration of the COVID-19 declaration under Section 564(b)(1) of the Act, 21 U.S.C. section 360bbb-3(b)(1), unless the authorization is terminated or revoked.  Performed at [facility], [place] [address] [place], [place] [address]   Acetaminophen level     Status: Abnormal   Collection Time: 07/28/21 10:50 AM  Result Value Ref Range   Acetaminophen (Tylenol), Serum <10 (L) 10 - 30 ug/mL    Comment: (NOTE) Therapeutic concentrations vary significantly. A range of 10-30 ug/mL  may be an effective concentration for many patients. However, some  are best  treated at concentrations outside of this range. Acetaminophen concentrations >150 ug/mL at 4 hours after ingestion  and >50 ug/mL at 12 hours after ingestion are often associated with  toxic reactions.  Performed at [facility], [place] [address], [place], [place] [address]   Salicylate level     Status: Abnormal   Collection Time: 07/28/21 10:50 AM  Result Value Ref Range   Salicylate Lvl <6.0 (L) 7.0 - 30.0 mg/dL    Comment: Performed at [facility], [address], [place], [place] [address]  Ammonia     Status: Abnormal   Collection Time: 07/28/21 10:50 AM  Result Value Ref Range   Ammonia 36 (H) 9 - 35 umol/L    Comment: Performed at [facility], [place] [address], [place], [place] [address]  HIV Antibody (routine testing w rflx)     Status: None   Collection Time: 07/28/21  4:24 PM  Result Value Ref Range   HIV Screen 4th Generation wRfx Non Reactive Non Reactive    Comment: Performed at [facility], [place] [address], [place], [place] [address]  Glucose, capillary     Status: Abnormal   Collection Time: 07/28/21  5:47 PM  Result Value Ref Range   Glucose-Capillary 417 (H) 70 - 99 mg/dL    Comment: Glucose reference range applies only to samples taken after fasting for at least 8 hours.  Blood gas, venous     Status: Abnormal   Collection Time: 07/28/21  6:32 PM  Result Value Ref Range   pH, Ven 7.29 7.25 - 7.43   pCO2, Ven 53 44 - 60 mmHg   pO2, Ven 70 (H) 32 - 45 mmHg   Bicarbonate 25.5 20.0 - 28.0 mmol/L   Acid-base deficit 1.9 0.0 - 2.0 mmol/L   O2 Saturation 94.8 %   Patient temperature 37.1    Collection site Right hand    Drawn by [id number]     Comment: Performed at [place] [address], [place], [place] [address]  Glucose, capillary     Status: Abnormal   Collection Time: 07/28/21  8:22 PM  Result Value Ref Range   Glucose-Capillary 308 (H) 70 - 99 mg/dL    Comment: Glucose reference range applies only to samples taken after  fasting for at least 8 hours.   Comment 1 Notify RN   Glucose, capillary     Status: Abnormal   Collection Time: 07/29/21 12:06 AM  Result Value Ref Range   Glucose-Capillary 206 (H) 70 - 99 mg/dL    Comment: Glucose reference range applies only to samples taken after fasting for at least 8 hours.  Glucose, capillary     Status: Abnormal   Collection Time: 07/29/21  3:09 AM  Result Value Ref Range   Glucose-Capillary 146 (H) 70 - 99 mg/dL    Comment: Glucose reference range applies only to samples taken after fasting for at least 8 hours.  Glucose, capillary     Status: Abnormal   Collection Time: 07/29/21  9:15 AM  Result Value Ref Range   Glucose-Capillary 196 (H) 70 - 99 mg/dL    Comment: Glucose reference range applies only to samples taken after fasting for at least 8 hours.   Comment 1 Notify RN    Comment 2 Document in Chart   Glucose, capillary     Status: Abnormal   Collection Time: 07/29/21 11:47 AM  Result Value Ref Range   Glucose-Capillary 271 (H) 70 - 99 mg/dL    Comment: Glucose reference range applies only to samples taken after fasting for at least 8 hours.    Current Facility-Administered Medications  Medication Dose Route Frequency Provider Last Rate Last Admin   albuterol (PROVENTIL) (2.5 MG/3ML) 0.083% nebulizer solution 2.5 mg  2.5 mg Nebulization Q6H PRN Norval Morton, MD       aspirin EC tablet 81 mg  81 mg Oral Daily Norval Morton, MD  clopidogrel (PLAVIX) tablet 75 mg  75 mg Oral Daily Smith, Rondell A, MD       enoxaparin (LOVENOX) injection 40 mg  40 mg Subcutaneous Q24H Smith, Rondell A, MD   40 mg at 07/28/21 1807   fenofibrate tablet 160 mg  160 mg Oral Daily Smith, Rondell A, MD       insulin aspart (novoLOG) injection 0-15 Units  0-15 Units Subcutaneous TID WC Smith, Rondell A, MD       insulin aspart (novoLOG) injection 15 Units  15 Units Subcutaneous BID WC Smith, Rondell A, MD       lamoTRIgine (LAMICTAL) tablet 150 mg  150 mg Oral BID  Tamala Julian, Rondell A, MD   150 mg at 07/28/21 2345   lidocaine (LIDODERM) 5 % 1 patch  1 patch Transdermal Daily PRN Fuller Plan A, MD       LORazepam (ATIVAN) injection 1 mg  1 mg Intravenous Q2H PRN Smith, Rondell A, MD       lurasidone (LATUDA) tablet 60 mg  60 mg Oral Q breakfast Smith, Rondell A, MD       morphine (MSIR) tablet 15 mg  15 mg Oral Q6H PRN Danford, Suann Larry, MD       ondansetron (ZOFRAN) tablet 4 mg  4 mg Oral Q6H PRN Fuller Plan A, MD       Or   ondansetron (ZOFRAN) injection 4 mg  4 mg Intravenous Q6H PRN Smith, Rondell A, MD       pantoprazole (PROTONIX) EC tablet 40 mg  40 mg Oral Daily Smith, Rondell A, MD       pregabalin (LYRICA) capsule 225 mg  225 mg Oral Daily Smith, Rondell A, MD       sodium chloride flush (NS) 0.9 % injection 3 mL  3 mL Intravenous Q12H Smith, Rondell A, MD   3 mL at 07/28/21 2346   tamsulosin (FLOMAX) capsule 0.8 mg  0.8 mg Oral QHS Smith, Rondell A, MD   0.8 mg at 07/28/21 2345   traZODone (DESYREL) tablet 100 mg  100 mg Oral QHS PRN Fuller Plan A, MD       venlafaxine XR (EFFEXOR-XR) 24 hr capsule 150 mg  150 mg Oral BID Fuller Plan A, MD   150 mg at 07/28/21 2345    Musculoskeletal: Strength & Muscle Tone: within normal limits Gait & Station: normal Patient leans: N/A            Psychiatric Specialty Exam:  Presentation  General Appearance: Casual  Eye Contact:Fair  Speech:Clear and Coherent; Normal Rate  Speech Volume:Normal  Handedness:Right   Mood and Affect  Mood:Irritable; Labile  Affect:Blunt; Labile   Thought Process  Thought Processes:Coherent; Linear  Descriptions of Associations:Intact  Orientation:Full (Time, Place and Person)  Thought Content:Logical  History of Schizophrenia/Schizoaffective disorder:No data recorded Duration of Psychotic Symptoms:No data recorded Hallucinations:Hallucinations: None  Ideas of Reference:None  Suicidal Thoughts:Suicidal Thoughts:  No  Homicidal Thoughts:Homicidal Thoughts: No   Sensorium  Memory:Immediate Fair; Recent Fair; Remote Fair  Judgment:Poor  Insight:Shallow   Executive Functions  Concentration:Fair  Attention Span:Fair  [place]  Language:Fair   Psychomotor Activity  Psychomotor Activity:Psychomotor Activity: Normal   Assets  Assets:Communication Skills; Desire for Improvement; Financial Resources/Insurance; Physical Health; Social Support   Sleep  Sleep:Sleep: Fair   Physical Exam: Physical Exam ROS Blood pressure (!) 177/77, pulse (!) 106, temperature 98.5 F (36.9 C), temperature source Oral, resp. rate 18, SpO2 97 %. There  is no height or weight on file to calculate BMI.  Treatment Plan Summary: Daily contact with patient to assess and evaluate symptoms and progress in treatment, Medication management, and Plan continue current psychotropic medication.  Patient is currently prescribed venlafaxine XR 150 mg p.o. twice daily, Latuda 60 mg p.o. daily with breakfast, Lamictal 150 mg p.o. twice daily(currently for seizure disorder).  He reports compliance with the medications listed above. -Will attempt to obtain collateral from sister Joseph Art (originally takes suicidal message) at [phone].    -Continue with IVC and suicide sitter.  -Will initiate agitation protocol, as patient appears to be agitated and displaying disruptive behaviors, also refusing labs and other medical treatment.  Patient currently denies any evidence of suicide attempt, wife Eura Radabaugh is going home to retrieve cell phone and any additional evidence available.  Disposition: Recommend psychiatric Inpatient admission when medically cleared.  Suella Broad, FNP 07/29/2021 1:36 PM

## 2021-07-30 DIAGNOSIS — G9341 Metabolic encephalopathy: Secondary | ICD-10-CM | POA: Diagnosis not present

## 2021-07-30 LAB — GLUCOSE, CAPILLARY
Glucose-Capillary: 194 mg/dL — ABNORMAL HIGH (ref 70–99)
Glucose-Capillary: 255 mg/dL — ABNORMAL HIGH (ref 70–99)
Glucose-Capillary: 214 mg/dL — ABNORMAL HIGH (ref 70–99)

## 2021-07-30 LAB — RESP PANEL BY RT-PCR (FLU A&B, COVID) ARPGX2
Influenza A by PCR: NEGATIVE
Influenza B by PCR: NEGATIVE
SARS Coronavirus 2 by RT PCR: NEGATIVE

## 2021-07-30 LAB — AMMONIA: Ammonia: <10 umol/L (ref 9–35)

## 2021-07-30 MED ORDER — MORPHINE SULFATE ER 15 MG PO TBCR
60.0000 mg | EXTENDED_RELEASE_TABLET | Freq: Two times a day (BID) | ORAL | Status: DC
Start: 2021-07-30 — End: 2021-07-31
  Administered 2021-07-30 (×2): 60 mg via ORAL
  Filled 2021-07-30 (×2): qty 4

## 2021-07-30 MED ORDER — AMLODIPINE BESYLATE 5 MG PO TABS: 5.0000 mg | ORAL_TABLET | Freq: Every day | ORAL | Status: DC

## 2021-07-30 MED ORDER — NICOTINE 21 MG/24HR TD PT24
21.0000 mg | MEDICATED_PATCH | Freq: Every day | TRANSDERMAL | Status: DC
  Administered 2021-07-30: 21 mg via TRANSDERMAL
  Filled 2021-07-30: qty 1

## 2021-07-30 MED ORDER — AMLODIPINE BESYLATE 5 MG PO TABS
5.0000 mg | ORAL_TABLET | Freq: Every day | ORAL | Status: DC
  Administered 2021-07-30: 5 mg via ORAL
  Filled 2021-07-30: qty 1

## 2021-07-30 NOTE — Progress Notes (Signed)
Patient decline insulin coverage this AM stating that "194 is not high for me". Education given. Patient appears calm at this time, sitter at bedside. Will continue to monitor.   Hav

## 2021-07-30 NOTE — Progress Notes (Signed)
The patient is medically cleared for discharge to Inpatient Psychiatry whenever a bed is available.

## 2021-07-30 NOTE — Progress Notes (Signed)
MD Progress Note -consult liaison psychiatry  07/30/2021 10:16 AM Vincent [name]  MRN:  [id number]  Subjective:   Vincent [name] is a 56 y.o. male patient admitted with seizure and altered mental status.  Patient continues to be vague and not forthcoming with details regarding information prior to this admission.  His wife Vincent [name] is present at bedside, and provides details surrounding PTA events.   On assessment today, the patient reports that he continues to not be able to recall the events leading up to the overdose.  According to the wife at bedside, based on details from information from other family members, he probably left the house around the same time she did, 6 PM on Wednesday, and returned later.  It is possible he took the overdose when he is away from the house, and return to the house.  She is still unable to locate the pill bottles. Additionally, the patient had texted his sister "I will be dead tomorrow. I want you to know much I love you. Your were always there when nobody else was. I want you to have my truck. I love you so much good by".  Per wife, there has been more irritability and mood changes, due to marital conflict.  She states that she has not noticed any sharp or drastic decline in mood or anxiety symptoms, leading up to the overdose.  She states that he had not made any suicidal statements or gestures, prior to the overdose.  Patient appears to be minimizing his symptoms.  It is unclear if he does recall events leading up to the overdose or not. Currently he reports that his mood is fine.  Reports poor sleep.  Reports appetite is okay.  Concentration is up and down. At this time he denies having suicidal thoughts.  He does not understand the severity of lethality of the overdose.  Denies HI. Reports anxiety is up and down. Denies any side effects to current psychiatric medications including venlafaxine, Latuda, lamotrigine.  Per wife, patient takes venlafaxine,  just 1 pill at home, and not twice daily.  This dose will need to be confirmed (150 mg once daily?).   We discussed that I will recommend patient for inpatient psychiatric admission, and answer questions regarding neck steps in this process.      Principal Problem: Acute metabolic encephalopathy Diagnosis: Principal Problem:   Acute metabolic encephalopathy Active Problems:   Bipolar depression ([place])   Bipolar 2 disorder (H[facility])   Chronic pain   Breakthrough seizure ([place])   Hyperglycemia due to type 2 diabetes mellitus (H[facility])   Stage 3b chronic kidney disease (CKD) ([place])   Suicide attempt by drug overdose ([place])  Total Time spent with patient: 30 minutes  Past Psychiatric History:  Bipolar and PTSD. Under the care of Dr. Adele Schilder. Currently prescribed Latuda and Trazodone. Last visit 06/30/2021.  Past Medical History:  Past Medical History:  Diagnosis Date   Anxiety    Benign essential tremor    Bipolar 2 disorder ([place])    followed by [facility]--- dr s. Adele Schilder   Bladder cancer [facility])    recurrent   CAD (coronary artery disease)    cardiac cath 2003  and 2011 both showed normal coronary arteries w/ preserved lvf;  Non obstructive on CTA Oct 2019.    Chronic pain syndrome    back---- [facility] [place]   Cold extremities    BLE   COPD (chronic obstructive pulmonary disease) ([place])  DDD (degenerative disc disease), lumbar    Diabetic peripheral neuropathy ([place])    Gastroparesis    followed by dr Henrene Pastor   GERD (gastroesophageal reflux disease)    Hiatal hernia    History of bladder cancer urologist-  previously dr Consuella Lose;  now dr gay   papillay TCC (Ta G1)  s/p TURBT and chemo instillation 2014   History of chest pain 12/2017   heart cath normal   History of encephalopathy 05/27/2015   admission w/ acute encephalopathy thought to be secondary to pain meds and COPD   History of gastric ulcer    History of Helicobacter pylori infection    History of kidney  stones    History of TIA (transient ischemic attack) 2008  and 10-19-2018    no residual's   History of traumatic head injury 2010   w/ LOC  per pt needed stitches, hit in head with a mower blade   Hyperlipidemia    Hypertension    Hypogonadism male    s/p  bilateral orchiectomy   Hypothyroidism    Insomnia    Mild obstructive sleep apnea    study in epic 12-04-2016, no cpap   PTSD (post-traumatic stress disorder)    chronic   PTSD (post-traumatic stress disorder)    RA (rheumatoid arthritis) ([place])    followed by [facility]   Seizures, transient [facility]) neurologist-  dr Krista Blue--  differential dx complex partial seizure .vs.  mood disorder .vs.  pseudoseizure--  negative EEG's   confusion episodes and staring spells since 11/ 2015   (03-26-2020 per pt wife last seizure 10 /2021)   Transient confusion NEUOROLOGIST-  DR Krista Blue   Episodes since 11/ 2015--  neurologist dx  differential complex partial seizure  .vs. mood disorder . vs. pseudoseizure   Type 2 diabetes mellitus treated with insulin [facility])    endocrinologist--- dr Loanne Drilling---  (03-26-2020 pt does not check blood sugar at home)    Past Surgical History:  Procedure Laterality Date   AMPUTATION Left 04/28/2020   Procedure: LEFT LITTLE FINGER AMPUTATION;  Surgeon: Newt Minion, MD;  Location: [place];  Service: Orthopedics;  Laterality: Left;   CARDIAC CATHETERIZATION  12-27-2001  DR Einar Gip  &  05-26-2009  DR Irish Lack   RESULTS FOR BOTH ARE NORMAL CORONARIES AND PERSERVED LVF/ EF 60%   CARPAL TUNNEL RELEASE Bilateral right 09-16-2003;  left ?   CARPAL TUNNEL RELEASE Left 02/25/2015   Procedure: LEFT CARPAL TUNNEL RELEASE;  Surgeon: Leanora Cover, MD;  Location: [place];  Service: Orthopedics;  Laterality: Left;   CYSTOSCOPY N/A 10/10/2012   Procedure: CYSTOSCOPY CLOT EVACUATION FULGERATION OF BLEEDERS ;  Surgeon: Claybon Jabs, MD;  Location: [facility];  Service: Urology;  Laterality: N/A;    CYSTOSCOPY WITH BIOPSY N/A 11/26/2015   Procedure: CYSTOSCOPY WITH BIOPSY AND FULGURATION;  Surgeon: Kathie Rhodes, MD;  Location: [place];  Service: Urology;  Laterality: N/A;   ESOPHAGOGASTRODUODENOSCOPY  2014   LAPAROSCOPIC CHOLECYSTECTOMY  11-17-2010   ORCHIECTOMY Right 02/21/2016   Procedure: SCROTAL ORCHIECTOMY with TESTICULAR PROSTHESIS IMPLANT;  Surgeon: Kathie Rhodes, MD;  Location: [facility];  Service: Urology;  Laterality: Right;   ORCHIECTOMY Left 09/02/2018   Procedure: ORCHIECTOMY;  Surgeon: Kathie Rhodes, MD;  Location: [facility];  Service: Urology;  Laterality: Left;   ROTATOR CUFF REPAIR Right 12/2004   TRANSURETHRAL RESECTION OF BLADDER TUMOR N/A 08/09/2012   Procedure: TRANSURETHRAL RESECTION OF BLADDER TUMOR (  TURBT) WITH GYRUS WITH MITOMYCIN C;  Surgeon: Claybon Jabs, MD;  Location: [facility];  Service: Urology;  Laterality: N/A;   TRANSURETHRAL RESECTION OF BLADDER TUMOR N/A 03/29/2020   Procedure: TRANSURETHRAL RESECTION OF BLADDER TUMOR (TURBT) and post-op instillation of gemcitabine;  Surgeon: Janith Lima, MD;  Location: [facility];  Service: Urology;  Laterality: N/A;   TRANSURETHRAL RESECTION OF BLADDER TUMOR WITH GYRUS (TURBT-GYRUS) N/A 02/27/2014   Procedure: TRANSURETHRAL RESECTION OF BLADDER TUMOR WITH GYRUS (TURBT-GYRUS);  Surgeon: Claybon Jabs, MD;  Location: [facility];  Service: Urology;  Laterality: N/A;   Family History:  Family History  Problem Relation Age of Onset   Diabetes Mother    Diabetes Father    Hypertension Father    Heart attack Father 4       died age 48   Alcohol abuse Father    Colon cancer Neg Hx    Esophageal cancer Neg Hx    Stomach cancer Neg Hx    Rectal cancer Neg Hx    Family Psychiatric  History: As per patient " Father undiagnosed with depression; maternal aunt history of depression.  Niece with history of suicide attempt by  overdose and cutting."   Social History:  Social History   Substance and Sexual Activity  Alcohol Use No     Social History   Substance and Sexual Activity  Drug Use No    Social History   Socioeconomic History   Marital status: Married    Spouse name: Not on file   Number of children: 3   Years of education: GED   Highest education level: Not on file  Occupational History   Occupation: Engineer, technical sales    Comment: Owner of company  Tobacco Use   Smoking status: Every Day    Packs/day: 1.50    Years: 38.00    Pack years: 57.00    Types: Cigarettes   Smokeless tobacco: Never  Vaping Use   Vaping Use: Never used  Substance and Sexual Activity   Alcohol use: No   Drug use: No   Sexual activity: Not Currently    Partners: Female    Birth control/protection: None  Other Topics Concern   Not on file  Social History Narrative   Lives at home with his wife and children.   Left-handed.   3-4 cups caffeine per day.   Social Determinants of Health   Financial Resource Strain: Not on file  Food Insecurity: Not on file  Transportation Needs: Not on file  Physical Activity: Not on file  Stress: Not on file  Social Connections: Not on file   Additional Social History:                         Sleep: Poor  Appetite:  Fair  Current Medications: Current Facility-Administered Medications  Medication Dose Route Frequency Provider Last Rate Last Admin   albuterol (PROVENTIL) (2.5 MG/3ML) 0.083% nebulizer solution 2.5 mg  2.5 mg Nebulization Q6H PRN Fuller Plan A, MD       aspirin EC tablet 81 mg  81 mg Oral Daily Smith, Rondell A, MD   81 mg at 07/30/21 0844   clopidogrel (PLAVIX) tablet 75 mg  75 mg Oral Daily Smith, Rondell A, MD   75 mg at 07/30/21 0844   enoxaparin (LOVENOX) injection 40 mg  40 mg Subcutaneous Q24H Smith, Rondell A, MD   40 mg at  07/28/21 1807   fenofibrate tablet 160 mg  160 mg Oral Daily Smith, Rondell A, MD   160 mg at  07/30/21 0844   insulin aspart (novoLOG) injection 0-15 Units  0-15 Units Subcutaneous TID WC Fuller Plan A, MD   5 Units at 07/29/21 1830   insulin aspart (novoLOG) injection 15 Units  15 Units Subcutaneous BID WC Fuller Plan A, MD   15 Units at 07/30/21 0831   lamoTRIgine (LAMICTAL) tablet 150 mg  150 mg Oral BID Fuller Plan A, MD   150 mg at 07/30/21 0843   lidocaine (LIDODERM) 5 % 1 patch  1 patch Transdermal Daily PRN Fuller Plan A, MD       LORazepam (ATIVAN) injection 1 mg  1 mg Intravenous Q2H PRN Smith, Rondell A, MD       OLANZapine zydis (ZYPREXA) disintegrating tablet 10 mg  10 mg Oral Q8H PRN Starkes-Perry, Gayland Curry, FNP       And   LORazepam (ATIVAN) tablet 1 mg  1 mg Oral PRN Starkes-Perry, Gayland Curry, FNP       And   ziprasidone (GEODON) injection 20 mg  20 mg Intramuscular PRN Starkes-Perry, Gayland Curry, FNP       lurasidone (LATUDA) tablet 60 mg  60 mg Oral Q breakfast Tamala Julian, Rondell A, MD   60 mg at 07/30/21 0843   morphine (MSIR) tablet 15 mg  15 mg Oral Q6H PRN Edwin Dada, MD   15 mg at 07/29/21 1830   ondansetron (ZOFRAN) tablet 4 mg  4 mg Oral Q6H PRN Fuller Plan A, MD       Or   ondansetron (ZOFRAN) injection 4 mg  4 mg Intravenous Q6H PRN Smith, Rondell A, MD       pantoprazole (PROTONIX) EC tablet 40 mg  40 mg Oral Daily Smith, Rondell A, MD   40 mg at 07/30/21 0844   pregabalin (LYRICA) capsule 225 mg  225 mg Oral Daily Smith, Rondell A, MD   225 mg at 07/30/21 0842   sodium chloride flush (NS) 0.9 % injection 3 mL  3 mL Intravenous Q12H Smith, Rondell A, MD   3 mL at 07/30/21 0846   tamsulosin (FLOMAX) capsule 0.8 mg  0.8 mg Oral QHS Smith, Rondell A, MD   0.8 mg at 07/28/21 2345   traZODone (DESYREL) tablet 100 mg  100 mg Oral QHS PRN Fuller Plan A, MD   100 mg at 07/29/21 2046   venlafaxine XR (EFFEXOR-XR) 24 hr capsule 150 mg  150 mg Oral BID Fuller Plan A, MD   150 mg at 07/30/21 6948    Lab Results:  Results for orders placed or  performed during the hospital encounter of 07/28/21 (from the past 48 hour(s))  Acetaminophen level     Status: Abnormal   Collection Time: 07/28/21 10:50 AM  Result Value Ref Range   Acetaminophen (Tylenol), Serum <10 (L) 10 - 30 ug/mL    Comment: (NOTE) Therapeutic concentrations vary significantly. A range of 10-30 ug/mL  may be an effective concentration for many patients. However, some  are best treated at concentrations outside of this range. Acetaminophen concentrations >150 ug/mL at 4 hours after ingestion  and >50 ug/mL at 12 hours after ingestion are often associated with  toxic reactions.  Performed at [facility], [place] [address], [place], [place] [address]   Salicylate level     Status: Abnormal   Collection Time: 07/28/21 10:50 AM  Result Value  Ref Range   Salicylate Lvl <3.1 (L) 7.0 - 30.0 mg/dL    Comment: Performed at [facility] [address], [place], [place] [address]  Ammonia     Status: Abnormal   Collection Time: 07/28/21 10:50 AM  Result Value Ref Range   Ammonia 36 (H) 9 - 35 umol/L    Comment: Performed at [facility], [place] [address], [place], [place] [address]  HIV Antibody (routine testing w rflx)     Status: None   Collection Time: 07/28/21  4:24 PM  Result Value Ref Range   HIV Screen 4th Generation wRfx Non Reactive Non Reactive    Comment: Performed at [facility], [place] [address] [place], [place] [address]  Glucose, capillary     Status: Abnormal   Collection Time: 07/28/21  5:47 PM  Result Value Ref Range   Glucose-Capillary 417 (H) 70 - 99 mg/dL    Comment: Glucose reference range applies only to samples taken after fasting for at least 8 hours.  Blood gas, venous     Status: Abnormal   Collection Time: 07/28/21  6:32 PM  Result Value Ref Range   pH, Ven 7.29 7.25 - 7.43   pCO2, Ven 53 44 - 60 mmHg   pO2, Ven 70 (H) 32 - 45 mmHg   Bicarbonate 25.5 20.0 - 28.0 mmol/L   Acid-base deficit 1.9 0.0 - 2.0 mmol/L    O2 Saturation 94.8 %   Patient temperature 37.1    Collection site Right hand    Drawn by [id number]     Comment: Performed at [facility] [address] [place], [place] [address]  Glucose, capillary     Status: Abnormal   Collection Time: 07/28/21  8:22 PM  Result Value Ref Range   Glucose-Capillary 308 (H) 70 - 99 mg/dL    Comment: Glucose reference range applies only to samples taken after fasting for at least 8 hours.   Comment 1 Notify RN   Glucose, capillary     Status: Abnormal   Collection Time: 07/29/21 12:06 AM  Result Value Ref Range   Glucose-Capillary 206 (H) 70 - 99 mg/dL    Comment: Glucose reference range applies only to samples taken after fasting for at least 8 hours.  Glucose, capillary     Status: Abnormal   Collection Time: 07/29/21  3:09 AM  Result Value Ref Range   Glucose-Capillary 146 (H) 70 - 99 mg/dL    Comment: Glucose reference range applies only to samples taken after fasting for at least 8 hours.  Glucose, capillary     Status: Abnormal   Collection Time: 07/29/21  9:15 AM  Result Value Ref Range   Glucose-Capillary 196 (H) 70 - 99 mg/dL    Comment: Glucose reference range applies only to samples taken after fasting for at least 8 hours.   Comment 1 Notify RN    Comment 2 Document in Chart   Glucose, capillary     Status: Abnormal   Collection Time: 07/29/21 11:47 AM  Result Value Ref Range   Glucose-Capillary 271 (H) 70 - 99 mg/dL    Comment: Glucose reference range applies only to samples taken after fasting for at least 8 hours.  Glucose, capillary     Status: Abnormal   Collection Time: 07/29/21  4:44 PM  Result Value Ref Range   Glucose-Capillary 254 (H) 70 - 99 mg/dL    Comment: Glucose reference range applies only to  samples taken after fasting for at least 8 hours.   Comment 1 Notify RN    Comment 2 Document in Chart   Glucose, capillary     Status: Abnormal   Collection Time: 07/29/21 11:26 PM  Result Value Ref Range    Glucose-Capillary 188 (H) 70 - 99 mg/dL    Comment: Glucose reference range applies only to samples taken after fasting for at least 8 hours.  Glucose, capillary     Status: Abnormal   Collection Time: 07/30/21  6:38 AM  Result Value Ref Range   Glucose-Capillary 194 (H) 70 - 99 mg/dL    Comment: Glucose reference range applies only to samples taken after fasting for at least 8 hours.    Blood Alcohol level:  Lab Results  Component Value Date   ETH <10 07/28/2021   ETH <10 36/14/4315    Metabolic Disorder Labs: Lab Results  Component Value Date   HGBA1C 14.1 (H) 07/28/2021   MPG 357.97 07/28/2021   MPG 291.96 11/14/2020   No results found for: PROLACTIN Lab Results  Component Value Date   CHOL 240 (H) 11/16/2020   TRIG 325 (H) 11/16/2020   HDL 38 (L) 11/16/2020   CHOLHDL 6.3 11/16/2020   VLDL 65 (H) 11/16/2020   LDLCALC 137 (H) 11/16/2020   LDLCALC 59 06/11/2020    Physical Findings: AIMS:  , ,  ,  ,    CIWA:    COWS:     Musculoskeletal: Strength & Muscle Tone: Laying in bed   Gait & Station: Laying in bed   Patient leans: Laying in bed    Psychiatric Specialty Exam:  Presentation  General Appearance: Casual  Eye Contact:Poor  Speech:Normal Rate; Clear and Coherent  Speech Volume:Normal  Handedness:Right   Mood and Affect  Mood:Anxious; Dysphoric; Irritable  Affect:Congruent   Thought Process  Thought Processes:Goal Directed  Descriptions of Associations:Intact  Orientation:Full (Time, Place and Person)  Thought Content:Logical  History of Schizophrenia/Schizoaffective disorder:No data recorded Duration of Psychotic Symptoms:No data recorded Hallucinations:Hallucinations: None  Ideas of Reference:None  Suicidal Thoughts:Suicidal Thoughts: No  Homicidal Thoughts:Homicidal Thoughts: No   Sensorium  Memory:Remote Good; Immediate Fair; Recent Poor  Judgment:Poor  Insight:Poor   Executive Functions   Concentration:Fair  Attention Span:Fair  [place]   Psychomotor Activity  Psychomotor Activity:Psychomotor Activity: Normal   Assets  Assets:Communication Skills; Desire for Improvement; Financial Resources/Insurance; Physical Health; Social Support   Sleep  Sleep:Sleep: Poor    Physical Exam: Physical Exam Vitals reviewed.  Pulmonary:     Effort: Pulmonary effort is normal.  Neurological:     Mental Status: He is alert.   Review of Systems  Psychiatric/Behavioral:  Positive for depression. The patient is nervous/anxious and has insomnia.   Blood pressure (!) 167/74, pulse 76, temperature 99.1 F (37.3 C), temperature source Oral, resp. rate 16, SpO2 98 %. There is no height or weight on file to calculate BMI.   Treatment Plan Summary:  Daily contact with patient to assess and evaluate symptoms and progress in treatment, Medication management, and Plan continue current psychotropic medication.    Dx: Bipolar disorder PTSD  Assessment: -Patient requires inpatient psychiatric hospitalization for evaluation, treatment, and safety -Consult social work for inpatient psychiatric admission -Continue with IVC and suicide sitter. -Decrease venlafaxine to 150 mg once daily -Continue Latuda and lamotrigine as ordered -Continue agitation protocol       Disposition: Recommend psychiatric Inpatient admission -Per hospitalist, patient is now medically cleared  Christoper Allegra, MD 07/30/2021, 10:16 AM  Total Time Spent in Direct Patient Care:  I personally spent 35 minutes on the unit in direct patient care. The direct patient care time included face-to-face time with the patient, reviewing the patient's chart, communicating with other professionals, and coordinating care. Greater than 50% of this time was spent in counseling or coordinating care with the patient regarding goals of hospitalization, psycho-education,  and discharge planning needs.   Janine Limbo, MD Psychiatrist

## 2021-07-30 NOTE — Progress Notes (Signed)
Left message for accepting RN to call back for report.

## 2021-07-30 NOTE — Discharge Summary (Signed)
Physician Discharge Summary   Patient: Vincent [name] MRN: [id number] DOB: [birth year]  Admit date:     07/28/2021  Discharge date: 07/30/21  Discharge Physician: Edwin Dada   PCP: Vincent Rasmussen, MD     Recommendations at discharge:  Transfer to Inpatient Psychiatry Follow up with PCP in 1 week for new amlodipine for high blood pressure     Discharge Diagnoses: Principal Problem:   Acute metabolic encephalopathy Active Problems:   Suicide attempt by drug overdose ([place])   Breakthrough seizure ([place])   Hyperglycemia due to type 2 diabetes mellitus ([place])   Bipolar 2 disorder ([place])   Stage 3b chronic kidney disease (CKD) ([place])   Bipolar depression ([place])   Chronic pain   High blood pressure      Hospital Course: Vincent [name] is a 56 y.o. M with HTN, CAD, hx TIA/CVA, COPD/smoking, Bipolar disorder, chronic pain on opiates, bladder cancer and epilepsy who presented with altered mental status.  Evidently, wife heard something in his room early in the morning, found him supine on the floor.  Called EMS, started CPR momentarily then he started stirring, then administered 2 Narcans (unclear if he responded to the first, but after the second he appeared to have a GTC seizure).  In the ER, woke enough to explain that he had taken an overdose of morphine in order to harm himself.     * Acute metabolic encephalopathy His home morphine was reduced and his home anti-epileptic (Lamictal) was continued and his mentation appeared to return to normal.  On the day of discharge, he was A&Ox4.  Had had no seizures, and was cooperating with most nursing cares.    Suicide attempt by drug overdose [facility]) Psychiatry evaluated and recommended inpatient psychiatric treatment.  Wife confirmed to me separately that he had expressed a wish to kill himself.    With regard to aspirin overdose, his salicylate level was normal and he had no acidosis and I do not believe he truly took this.   With regard to morphine, given the speed with which he improved, I suspect he took only 3-4 tablets.     Breakthrough seizure ([place]) EMS observed a seizure en route. This was likely provoked in the setting of medication nonadherence and Narcan reversal.  Hypertension Previously on amlodipine, Imdur. Not currently taking.  Amlodipine restarted here            Consultants: Psychaitry Disposition: Inpatient Pshicatric treamtent   DISCHARGE MEDICATION: Allergies as of 07/30/2021       Reactions   Celebrex [celecoxib] Anaphylaxis, Rash   Hydrocodone Rash, Other (See Comments)   "blisters developed on arms"   Sulfa Antibiotics Rash        Medication List     TAKE these medications    acetaminophen 325 MG tablet Commonly known as: Tylenol Take 2 tablets (650 mg total) by mouth every 6 (six) hours as needed.   amLODipine 5 MG tablet Commonly known as: NORVASC Take 1 tablet (5 mg total) by mouth daily. Start taking on: Jul 31, 2021   aspirin EC 81 MG tablet Take 1 tablet (81 mg total) by mouth daily. Swallow whole.   clopidogrel 75 MG tablet Commonly known as: PLAVIX Take 1 tablet (75 mg total) by mouth daily.   fenofibrate 160 MG tablet Take 160 mg by mouth daily.   FreeStyle Libre 2 Sensor Misc 1 Device by Does not apply route every 14 (fourteen) days. What changed: additional instructions   HumaLOG  KwikPen 200 UNIT/ML KwikPen Generic drug: insulin lispro Inject 15 Units into the skin in the morning and at bedtime.   lamoTRIgine 150 MG tablet Commonly known as: LAMICTAL Take 1 tablet (150 mg total) by mouth 2 (two) times daily.   lidocaine 5 % Commonly known as: Lidoderm Place 1 patch onto the skin daily as needed. Remove & Discard patch within 12 hours or as directed by MD   Lurasidone HCl 60 MG Tabs Commonly known as: Latuda Take 1 tablet (60 mg total) by mouth daily with breakfast.   metFORMIN 500 MG 24 hr tablet Commonly known as:  GLUCOPHAGE-XR Take 500 mg by mouth 2 (two) times daily.   morphine 15 MG tablet Commonly known as: MSIR Take 15 mg by mouth 2 (two) times daily as needed. For breakthrough pain  (03-26-2020 per pt wife pt takes this twice also   morphine 60 MG 12 hr tablet Commonly known as: MS CONTIN Take 60 mg by mouth 2 (two) times daily. Every 12 hours   omeprazole 40 MG capsule Commonly known as: PRILOSEC Take 40 mg by mouth daily.   pregabalin 225 MG capsule Commonly known as: LYRICA Take 1 capsule by mouth in the morning What changed:  when to take this additional instructions   rosuvastatin 10 MG tablet Commonly known as: CRESTOR Take 1 tablet (10 mg total) by mouth daily.   tamsulosin 0.4 MG Caps capsule Commonly known as: FLOMAX Take 0.8 mg by mouth at bedtime.   testosterone cypionate 200 MG/ML injection Commonly known as: DEPOTESTOSTERONE CYPIONATE Inject 200 mg into the skin every 14 (fourteen) days.   traZODone 100 MG tablet Commonly known as: DESYREL Take 1 tablet (100 mg total) by mouth at bedtime as needed for sleep.   venlafaxine XR 150 MG 24 hr capsule Commonly known as: EFFEXOR-XR TAKE 1 CAPSULE BY MOUTH ONCE DAILY WITH BREAKFAST What changed:  how to take this when to take this   Vitamin D (Ergocalciferol) 1.25 MG (50000 UNIT) Caps capsule Commonly known as: DRISDOL Take 50,000 Units by mouth every Wednesday.           Discharge Exam: There were no vitals filed for this visit.  General: Pt is alert, awake, not in acute distress Cardiovascular: RRR, nl S1-S2, no murmurs appreciated.   No LE edema.   Respiratory: Normal respiratory rate and rhythm.  CTAB without rales or wheezes. Abdominal: Abdomen soft and non-tender.  No distension or HSM.   Neuro/Psych: Strength symmetric in upper and lower extremities.  Judgment and insight appear normal, pain limits right arm movement, right hand missing fifth digit, mild tremor bilateraly   Condition at  discharge: good  The results of significant diagnostics from this hospitalization (including imaging, microbiology, ancillary and laboratory) are listed below for reference.   Imaging Studies: DG Chest 1 View  Result Date: 07/28/2021 CLINICAL DATA:  Multiple seizures, possible morphine overdose, shortness of breath and wheezing EXAM: CHEST  1 VIEW COMPARISON:  June 06, 2021 FINDINGS: The heart size and mediastinal contours are within normal limits. Lung volumes are low. No focal consolidation, significant vascular congestion or pleural effusion seen. The visualized skeletal structures are unremarkable. IMPRESSION: No active disease. Electronically Signed   By: Frazier Richards M.D.   On: 07/28/2021 07:44   CT Head Wo Contrast  Result Date: 07/28/2021 CLINICAL DATA:  Head and neck trauma EXAM: CT HEAD WITHOUT CONTRAST CT CERVICAL SPINE WITHOUT CONTRAST TECHNIQUE: Multidetector CT imaging of the head and cervical spine was  performed following the standard protocol without intravenous contrast. Multiplanar CT image reconstructions of the cervical spine were also generated. RADIATION DOSE REDUCTION: This exam was performed according to the departmental dose-optimization program which includes automated exposure control, adjustment of the mA and/or kV according to patient size and/or use of iterative reconstruction technique. COMPARISON:  Brain and cervical spine MRI and CT 06/06/2021 FINDINGS: Brain: There is no acute intracranial hemorrhage, extra-axial fluid collection, or acute infarct. Parenchymal volume is normal. The ventricles are normal in size. Gray-white differentiation is preserved. There is no mass lesion. There is no mass effect or midline shift. Vascular: No hyperdense vessel or unexpected calcification. Skull: Normal. Negative for fracture or focal lesion. Sinuses/Orbits: The imaged paranasal sinuses are clear. The mastoid air cells are clear. The globes and orbits are unremarkable. Other: None.  CT CERVICAL SPINE FINDINGS Alignment: Normal. There is no antero or retrolisthesis. There is no jumped or perched facets or other evidence of traumatic malalignment. Skull base and vertebrae: Skull base alignment is maintained. Vertebral body heights are preserved. There is no evidence of acute fracture. There is no suspicious osseous lesion. Soft tissues and spinal canal: No prevertebral fluid or swelling. No visible canal hematoma. Disc levels: There is ankylosis of the left C3-C4 facet joint. There is mild degenerative change in the lower cervical spine without significant spinal canal or neural foraminal stenosis. Upper chest: The imaged lung apices are clear. Other: None. IMPRESSION: 1. No acute intracranial pathology. 2. No acute fracture or traumatic malalignment of the cervical spine. Electronically Signed   By: Valetta Mole M.D.   On: 07/28/2021 09:24   CT Cervical Spine Wo Contrast  Result Date: 07/28/2021 CLINICAL DATA:  Head and neck trauma EXAM: CT HEAD WITHOUT CONTRAST CT CERVICAL SPINE WITHOUT CONTRAST TECHNIQUE: Multidetector CT imaging of the head and cervical spine was performed following the standard protocol without intravenous contrast. Multiplanar CT image reconstructions of the cervical spine were also generated. RADIATION DOSE REDUCTION: This exam was performed according to the departmental dose-optimization program which includes automated exposure control, adjustment of the mA and/or kV according to patient size and/or use of iterative reconstruction technique. COMPARISON:  Brain and cervical spine MRI and CT 06/06/2021 FINDINGS: Brain: There is no acute intracranial hemorrhage, extra-axial fluid collection, or acute infarct. Parenchymal volume is normal. The ventricles are normal in size. Gray-white differentiation is preserved. There is no mass lesion. There is no mass effect or midline shift. Vascular: No hyperdense vessel or unexpected calcification. Skull: Normal. Negative for  fracture or focal lesion. Sinuses/Orbits: The imaged paranasal sinuses are clear. The mastoid air cells are clear. The globes and orbits are unremarkable. Other: None. CT CERVICAL SPINE FINDINGS Alignment: Normal. There is no antero or retrolisthesis. There is no jumped or perched facets or other evidence of traumatic malalignment. Skull base and vertebrae: Skull base alignment is maintained. Vertebral body heights are preserved. There is no evidence of acute fracture. There is no suspicious osseous lesion. Soft tissues and spinal canal: No prevertebral fluid or swelling. No visible canal hematoma. Disc levels: There is ankylosis of the left C3-C4 facet joint. There is mild degenerative change in the lower cervical spine without significant spinal canal or neural foraminal stenosis. Upper chest: The imaged lung apices are clear. Other: None. IMPRESSION: 1. No acute intracranial pathology. 2. No acute fracture or traumatic malalignment of the cervical spine. Electronically Signed   By: Valetta Mole M.D.   On: 07/28/2021 09:24    Microbiology: Results for orders  placed or performed during the hospital encounter of 07/28/21  Resp Panel by RT-PCR (Flu A&B, Covid) Nasopharyngeal Swab     Status: None   Collection Time: 07/28/21  9:39 AM   Specimen: Nasopharyngeal Swab; Nasopharyngeal(NP) swabs in vial transport medium  Result Value Ref Range Status   SARS Coronavirus 2 by RT PCR NEGATIVE NEGATIVE Final    Comment: (NOTE) SARS-CoV-2 target nucleic acids are NOT DETECTED.  The SARS-CoV-2 RNA is generally detectable in upper respiratory specimens during the acute phase of infection. The lowest concentration of SARS-CoV-2 viral copies this assay can detect is 138 copies/mL. A negative result does not preclude SARS-Cov-2 infection and should not be used as the sole basis for treatment or other patient management decisions. A negative result may occur with  improper specimen collection/handling, submission of  specimen other than nasopharyngeal swab, presence of viral mutation(s) within the areas targeted by this assay, and inadequate number of viral copies(<138 copies/mL). A negative result must be combined with clinical observations, patient history, and epidemiological information. The expected result is Negative.  Fact Sheet for Patients:  EntrepreneurPulse.com.au  Fact Sheet for Healthcare Providers:  IncredibleEmployment.be  This test is no t yet approved or cleared by the [place] FDA and  has been authorized for detection and/or diagnosis of SARS-CoV-2 by FDA under an Emergency Use Authorization (EUA). This EUA will remain  in effect (meaning this test can be used) for the duration of the COVID-19 declaration under Section 564(b)(1) of the Act, 21 U.S.C.section 360bbb-3(b)(1), unless the authorization is terminated  or revoked sooner.       Influenza A by PCR NEGATIVE NEGATIVE Final   Influenza B by PCR NEGATIVE NEGATIVE Final    Comment: (NOTE) The Xpert Xpress SARS-CoV-2/FLU/RSV plus assay is intended as an aid in the diagnosis of influenza from Nasopharyngeal swab specimens and should not be used as a sole basis for treatment. Nasal washings and aspirates are unacceptable for Xpert Xpress SARS-CoV-2/FLU/RSV testing.  Fact Sheet for Patients: EntrepreneurPulse.com.au  Fact Sheet for Healthcare Providers: IncredibleEmployment.be  This test is not yet approved or cleared by the [place] FDA and has been authorized for detection and/or diagnosis of SARS-CoV-2 by FDA under an Emergency Use Authorization (EUA). This EUA will remain in effect (meaning this test can be used) for the duration of the COVID-19 declaration under Section 564(b)(1) of the Act, 21 U.S.C. section 360bbb-3(b)(1), unless the authorization is terminated or revoked.  Performed at [facility], [place] [address],  [place], [place] [address]     Labs: CBC: Recent Labs  Lab 07/28/21 0646  WBC 6.9  HGB 14.5  HCT 44.9  MCV 84.6  PLT 300   Basic Metabolic Panel: Recent Labs  Lab 07/28/21 0646  NA 139  K 4.2  CL 105  CO2 26  GLUCOSE 411*  BUN 20  CREATININE 1.85*  CALCIUM 8.9   Liver Function Tests: No results for input(s): AST, ALT, ALKPHOS, BILITOT, PROT, ALBUMIN in the last 168 hours. CBG: Recent Labs  Lab 07/29/21 0915 07/29/21 1147 07/29/21 1644 07/29/21 2326 07/30/21 0638  GLUCAP 196* 271* 254* 188* 194*    Discharge time spent: approximately 40 minutes spent on discharge counseling, evaluation of patient on day of discharge, and coordination of discharge planning with nursing, social work, pharmacy and case management  Signed: Edwin Dada, MD [facility] 07/30/2021

## 2021-07-30 NOTE — Progress Notes (Signed)
This nurse attempted to call report to [facility] at 2000, no answer. Called for transport as instructed. When [facility] arrived, pt was unsteady on feet, so a wheelchair was obtained. Called [facility] to verify if wheelchair would be available there, & was told they had had an emergency and would not be able to admit pt until after 2300, when they would have an [facility] onsite. [facility] stated it is uncertain if they would have personnel available to transport at that hour. 3W charge nurse & [place] notified.

## 2021-07-30 NOTE — Progress Notes (Signed)
PIV removed. Discharge instructions completed. Patient verbalized understanding of medication regimen, follow up appointments and discharge instructions. Patient belongings gathered and packed to discharge. Attempted to call report, left message. Next shift will call transport at 8pm for admission at 2030.

## 2021-07-30 NOTE — Progress Notes (Signed)
Called [facility] and gave report at 2315. Called law enforcement to request transport at 2320.

## 2021-07-31 ENCOUNTER — Inpatient Hospital Stay (HOSPITAL_COMMUNITY)
Admission: RE | Admit: 2021-07-31 | Discharge: 2021-08-04 | DRG: 885 | Disposition: A | Discharge status: Home / Self Care | Payer: No Typology Code available for payment source | Source: Intra-hospital | Attending: Psychiatry | Admitting: Psychiatry

## 2021-07-31 ENCOUNTER — Encounter (HOSPITAL_COMMUNITY): Payer: Self-pay | Admitting: Psychiatry

## 2021-07-31 ENCOUNTER — Other Ambulatory Visit: Payer: Self-pay

## 2021-07-31 DIAGNOSIS — G894 Chronic pain syndrome: Secondary | ICD-10-CM | POA: Diagnosis present

## 2021-07-31 DIAGNOSIS — Z9079 Acquired absence of other genital organ(s): Secondary | ICD-10-CM

## 2021-07-31 DIAGNOSIS — G250 Essential tremor: Secondary | ICD-10-CM | POA: Diagnosis present

## 2021-07-31 DIAGNOSIS — R569 Unspecified convulsions: Secondary | ICD-10-CM | POA: Diagnosis present

## 2021-07-31 DIAGNOSIS — R45851 Suicidal ideations: Secondary | ICD-10-CM | POA: Diagnosis present

## 2021-07-31 DIAGNOSIS — Z8551 Personal history of malignant neoplasm of bladder: Secondary | ICD-10-CM

## 2021-07-31 DIAGNOSIS — G4700 Insomnia, unspecified: Secondary | ICD-10-CM | POA: Diagnosis present

## 2021-07-31 DIAGNOSIS — J449 Chronic obstructive pulmonary disease, unspecified: Secondary | ICD-10-CM | POA: Diagnosis present

## 2021-07-31 DIAGNOSIS — G4733 Obstructive sleep apnea (adult) (pediatric): Secondary | ICD-10-CM | POA: Diagnosis present

## 2021-07-31 DIAGNOSIS — Z833 Family history of diabetes mellitus: Secondary | ICD-10-CM

## 2021-07-31 DIAGNOSIS — E1122 Type 2 diabetes mellitus with diabetic chronic kidney disease: Secondary | ICD-10-CM | POA: Diagnosis present

## 2021-07-31 DIAGNOSIS — Z79890 Hormone replacement therapy (postmenopausal): Secondary | ICD-10-CM

## 2021-07-31 DIAGNOSIS — K219 Gastro-esophageal reflux disease without esophagitis: Secondary | ICD-10-CM | POA: Diagnosis present

## 2021-07-31 DIAGNOSIS — Z794 Long term (current) use of insulin: Secondary | ICD-10-CM

## 2021-07-31 DIAGNOSIS — M069 Rheumatoid arthritis, unspecified: Secondary | ICD-10-CM | POA: Diagnosis present

## 2021-07-31 DIAGNOSIS — F4312 Post-traumatic stress disorder, chronic: Secondary | ICD-10-CM | POA: Diagnosis present

## 2021-07-31 DIAGNOSIS — Z79891 Long term (current) use of opiate analgesic: Secondary | ICD-10-CM

## 2021-07-31 DIAGNOSIS — F5104 Psychophysiologic insomnia: Secondary | ICD-10-CM | POA: Diagnosis present

## 2021-07-31 DIAGNOSIS — F3181 Bipolar II disorder: Secondary | ICD-10-CM | POA: Diagnosis not present

## 2021-07-31 DIAGNOSIS — I2510 Atherosclerotic heart disease of native coronary artery without angina pectoris: Secondary | ICD-10-CM | POA: Diagnosis present

## 2021-07-31 DIAGNOSIS — F333 Major depressive disorder, recurrent, severe with psychotic symptoms: Principal | ICD-10-CM | POA: Diagnosis present

## 2021-07-31 DIAGNOSIS — E039 Hypothyroidism, unspecified: Secondary | ICD-10-CM | POA: Diagnosis present

## 2021-07-31 DIAGNOSIS — Z7984 Long term (current) use of oral hypoglycemic drugs: Secondary | ICD-10-CM

## 2021-07-31 DIAGNOSIS — E1143 Type 2 diabetes mellitus with diabetic autonomic (poly)neuropathy: Secondary | ICD-10-CM | POA: Diagnosis present

## 2021-07-31 DIAGNOSIS — I129 Hypertensive chronic kidney disease with stage 1 through stage 4 chronic kidney disease, or unspecified chronic kidney disease: Secondary | ICD-10-CM | POA: Diagnosis present

## 2021-07-31 DIAGNOSIS — G8929 Other chronic pain: Secondary | ICD-10-CM | POA: Diagnosis present

## 2021-07-31 DIAGNOSIS — R41843 Psychomotor deficit: Secondary | ICD-10-CM | POA: Diagnosis present

## 2021-07-31 DIAGNOSIS — Z9151 Personal history of suicidal behavior: Secondary | ICD-10-CM

## 2021-07-31 DIAGNOSIS — Z7982 Long term (current) use of aspirin: Secondary | ICD-10-CM

## 2021-07-31 DIAGNOSIS — T50902A Poisoning by unspecified drugs, medicaments and biological substances, intentional self-harm, initial encounter: Secondary | ICD-10-CM | POA: Diagnosis present

## 2021-07-31 DIAGNOSIS — E1142 Type 2 diabetes mellitus with diabetic polyneuropathy: Secondary | ICD-10-CM | POA: Diagnosis present

## 2021-07-31 DIAGNOSIS — F17210 Nicotine dependence, cigarettes, uncomplicated: Secondary | ICD-10-CM | POA: Diagnosis present

## 2021-07-31 DIAGNOSIS — E785 Hyperlipidemia, unspecified: Secondary | ICD-10-CM | POA: Diagnosis present

## 2021-07-31 DIAGNOSIS — E559 Vitamin D deficiency, unspecified: Secondary | ICD-10-CM | POA: Diagnosis present

## 2021-07-31 DIAGNOSIS — Z9181 History of falling: Secondary | ICD-10-CM

## 2021-07-31 DIAGNOSIS — Z8249 Family history of ischemic heart disease and other diseases of the circulatory system: Secondary | ICD-10-CM

## 2021-07-31 DIAGNOSIS — G9341 Metabolic encephalopathy: Secondary | ICD-10-CM | POA: Diagnosis not present

## 2021-07-31 DIAGNOSIS — K3184 Gastroparesis: Secondary | ICD-10-CM | POA: Diagnosis present

## 2021-07-31 DIAGNOSIS — Z87898 Personal history of other specified conditions: Secondary | ICD-10-CM

## 2021-07-31 DIAGNOSIS — Z87820 Personal history of traumatic brain injury: Secondary | ICD-10-CM

## 2021-07-31 DIAGNOSIS — Z8673 Personal history of transient ischemic attack (TIA), and cerebral infarction without residual deficits: Secondary | ICD-10-CM

## 2021-07-31 DIAGNOSIS — N1832 Chronic kidney disease, stage 3b: Secondary | ICD-10-CM | POA: Diagnosis present

## 2021-07-31 DIAGNOSIS — Z885 Allergy status to narcotic agent status: Secondary | ICD-10-CM

## 2021-07-31 DIAGNOSIS — Z7902 Long term (current) use of antithrombotics/antiplatelets: Secondary | ICD-10-CM

## 2021-07-31 DIAGNOSIS — Z79899 Other long term (current) drug therapy: Secondary | ICD-10-CM

## 2021-07-31 DIAGNOSIS — Z89022 Acquired absence of left finger(s): Secondary | ICD-10-CM

## 2021-07-31 DIAGNOSIS — Z882 Allergy status to sulfonamides status: Secondary | ICD-10-CM

## 2021-07-31 LAB — COMPREHENSIVE METABOLIC PANEL
ALT: 14 U/L (ref 0–44)
AST: 16 U/L (ref 15–41)
Albumin: 3.8 g/dL (ref 3.5–5.0)
Alkaline Phosphatase: 58 U/L (ref 38–126)
Anion gap: 8 (ref 5–15)
BUN: 13 mg/dL (ref 6–20)
CO2: 23 mmol/L (ref 22–32)
Calcium: 8.9 mg/dL (ref 8.9–10.3)
Chloride: 107 mmol/L (ref 98–111)
Creatinine, Ser: 0.96 mg/dL (ref 0.61–1.24)
GFR, Estimated: >60 mL/min (ref >60)
Glucose, Bld: 210 mg/dL — ABNORMAL HIGH (ref 70–99)
Potassium: 3.6 mmol/L (ref 3.5–5.1)
Sodium: 138 mmol/L (ref 135–145)
Total Bilirubin: 0.6 mg/dL (ref 0.3–1.2)
Total Protein: 6.8 g/dL (ref 6.5–8.1)

## 2021-07-31 LAB — CBC WITH DIFFERENTIAL/PLATELET
Abs Immature Granulocytes: 0.02 10*3/uL (ref 0.00–0.07)
Basophils Absolute: 0.1 10*3/uL (ref 0.0–0.1)
Basophils Relative: 1 %
Eosinophils Absolute: 0.2 10*3/uL (ref 0.0–0.5)
Eosinophils Relative: 4 %
HCT: 43.5 % (ref 39.0–52.0)
Hemoglobin: 14.6 g/dL (ref 13.0–17.0)
Immature Granulocytes: 0 %
Lymphocytes Relative: 34 %
Lymphs Abs: 1.7 10*3/uL (ref 0.7–4.0)
MCH: 27.6 pg (ref 26.0–34.0)
MCHC: 33.6 g/dL (ref 30.0–36.0)
MCV: 82.2 fL (ref 80.0–100.0)
Monocytes Absolute: 0.4 10*3/uL (ref 0.1–1.0)
Monocytes Relative: 7 %
Neutro Abs: 2.7 10*3/uL (ref 1.7–7.7)
Neutrophils Relative %: 54 %
Platelets: 242 10*3/uL (ref 150–400)
RBC: 5.29 MIL/uL (ref 4.22–5.81)
RDW: 12.8 % (ref 11.5–15.5)
WBC: 5 10*3/uL (ref 4.0–10.5)
nRBC: 0 % (ref 0.0–0.2)

## 2021-07-31 LAB — GLUCOSE, CAPILLARY
Glucose-Capillary: 180 mg/dL — ABNORMAL HIGH (ref 70–99)
Glucose-Capillary: 90 mg/dL (ref 70–99)
Glucose-Capillary: 149 mg/dL — ABNORMAL HIGH (ref 70–99)
Glucose-Capillary: 149 mg/dL — ABNORMAL HIGH (ref 70–99)

## 2021-07-31 LAB — VITAMIN B12: Vitamin B-12: 485 pg/mL (ref 180–914)

## 2021-07-31 LAB — TSH: TSH: 4.116 u[IU]/mL (ref 0.350–4.500)

## 2021-07-31 MED ORDER — PREGABALIN 75 MG PO CAPS
225.0000 mg | ORAL_CAPSULE | Freq: Every morning | ORAL | Status: DC
  Administered 2021-07-31 – 2021-08-04 (×5): 225 mg via ORAL
  Filled 2021-07-31 (×5): qty 3

## 2021-07-31 MED ORDER — MORPHINE SULFATE ER 30 MG PO TBCR
60.0000 mg | EXTENDED_RELEASE_TABLET | Freq: Two times a day (BID) | ORAL | Status: DC
  Administered 2021-07-31 – 2021-08-03 (×7): 60 mg via ORAL
  Filled 2021-07-31 (×7): qty 2

## 2021-07-31 MED ORDER — AMLODIPINE BESYLATE 5 MG PO TABS
5.0000 mg | ORAL_TABLET | Freq: Every day | ORAL | Status: DC
  Administered 2021-07-31 – 2021-08-04 (×5): 5 mg via ORAL
  Filled 2021-07-31 (×7): qty 1

## 2021-07-31 MED ORDER — TRAZODONE HCL 100 MG PO TABS
100.0000 mg | ORAL_TABLET | Freq: Every evening | ORAL | Status: DC | PRN
  Administered 2021-07-31 – 2021-08-03 (×4): 100 mg via ORAL
  Filled 2021-07-31 (×4): qty 1

## 2021-07-31 MED ORDER — METFORMIN HCL ER 500 MG PO TB24
500.0000 mg | ORAL_TABLET | Freq: Two times a day (BID) | ORAL | Status: DC
  Administered 2021-07-31 – 2021-08-04 (×9): 500 mg via ORAL
  Filled 2021-07-31 (×13): qty 1

## 2021-07-31 MED ORDER — ROSUVASTATIN CALCIUM 5 MG PO TABS
10.0000 mg | ORAL_TABLET | Freq: Every day | ORAL | Status: DC
  Administered 2021-07-31 – 2021-08-04 (×5): 10 mg via ORAL
  Filled 2021-07-31 (×4): qty 2
  Filled 2021-07-31 (×2): qty 1
  Filled 2021-07-31: qty 2

## 2021-07-31 MED ORDER — CLOPIDOGREL BISULFATE 75 MG PO TABS
75.0000 mg | ORAL_TABLET | Freq: Every day | ORAL | Status: DC
  Administered 2021-07-31 – 2021-08-04 (×5): 75 mg via ORAL
  Filled 2021-07-31 (×7): qty 1

## 2021-07-31 MED ORDER — LIDOCAINE 5 % EX PTCH: 1.0000 | MEDICATED_PATCH | Freq: Every day | CUTANEOUS | Status: DC | PRN

## 2021-07-31 MED ORDER — INSULIN ASPART 100 UNIT/ML IJ SOLN
0.0000 [IU] | Freq: Three times a day (TID) | INTRAMUSCULAR | Status: DC
  Administered 2021-07-31: 2 [IU] via SUBCUTANEOUS
  Administered 2021-07-31: 3 [IU] via SUBCUTANEOUS
  Administered 2021-08-01 (×2): 5 [IU] via SUBCUTANEOUS
  Administered 2021-08-02: 8 [IU] via SUBCUTANEOUS
  Administered 2021-08-02: 15 [IU] via SUBCUTANEOUS
  Administered 2021-08-03: 3 [IU] via SUBCUTANEOUS
  Administered 2021-08-03: 11 [IU] via SUBCUTANEOUS
  Administered 2021-08-03 – 2021-08-04 (×2): 3 [IU] via SUBCUTANEOUS
  Administered 2021-08-04: 5 [IU] via SUBCUTANEOUS

## 2021-07-31 MED ORDER — VENLAFAXINE HCL ER 150 MG PO CP24
150.0000 mg | ORAL_CAPSULE | Freq: Every day | ORAL | Status: DC
Start: 2021-07-31 — End: 2021-08-04
  Administered 2021-07-31 – 2021-08-04 (×5): 150 mg via ORAL
  Filled 2021-07-31 (×7): qty 1

## 2021-07-31 MED ORDER — NICOTINE 21 MG/24HR TD PT24
21.0000 mg | MEDICATED_PATCH | Freq: Every day | TRANSDERMAL | Status: DC
  Administered 2021-07-31 – 2021-08-04 (×5): 21 mg via TRANSDERMAL
  Filled 2021-07-31 (×7): qty 1

## 2021-07-31 MED ORDER — TAMSULOSIN HCL 0.4 MG PO CAPS
0.8000 mg | ORAL_CAPSULE | Freq: Every day | ORAL | Status: DC
  Administered 2021-07-31 – 2021-08-03 (×4): 0.8 mg via ORAL
  Filled 2021-07-31 (×6): qty 2

## 2021-07-31 MED ORDER — ASPIRIN 81 MG PO TBEC
81.0000 mg | DELAYED_RELEASE_TABLET | Freq: Every day | ORAL | Status: DC
  Administered 2021-07-31 – 2021-08-04 (×5): 81 mg via ORAL
  Filled 2021-07-31 (×10): qty 1

## 2021-07-31 MED ORDER — ACETAMINOPHEN 325 MG PO TABS: 650.0000 mg | ORAL_TABLET | Freq: Four times a day (QID) | ORAL | Status: DC | PRN

## 2021-07-31 MED ORDER — INSULIN ASPART 100 UNIT/ML IJ SOLN
15.0000 [IU] | Freq: Two times a day (BID) | INTRAMUSCULAR | Status: DC
  Administered 2021-07-31 – 2021-08-02 (×5): 15 [IU] via SUBCUTANEOUS

## 2021-07-31 MED ORDER — LURASIDONE HCL 60 MG PO TABS
60.0000 mg | ORAL_TABLET | Freq: Every day | ORAL | Status: DC
Start: 2021-07-31 — End: 2021-08-04
  Administered 2021-07-31 – 2021-08-04 (×5): 60 mg via ORAL
  Filled 2021-07-31 (×7): qty 1

## 2021-07-31 MED ORDER — PANTOPRAZOLE SODIUM 40 MG PO TBEC
40.0000 mg | DELAYED_RELEASE_TABLET | Freq: Every day | ORAL | Status: DC
Start: 2021-07-31 — End: 2021-08-04
  Administered 2021-07-31 – 2021-08-04 (×5): 40 mg via ORAL
  Filled 2021-07-31 (×7): qty 1

## 2021-07-31 MED ORDER — LAMOTRIGINE 150 MG PO TABS
150.0000 mg | ORAL_TABLET | Freq: Two times a day (BID) | ORAL | Status: DC
Start: 2021-07-31 — End: 2021-08-04
  Administered 2021-07-31 – 2021-08-04 (×9): 150 mg via ORAL
  Filled 2021-07-31 (×13): qty 1
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
## 2021-07-31 NOTE — Progress Notes (Signed)
1:1 Note 1800  Patient has been in his room sleeping most of the afternoon.  Has been to the bathroom.  Ate 100% of his dinner.  Respirations even and unlabored.  No signs/symptoms of pain/distress noted on patient's face/body movements.  1:1 continues per MD order for safety.

## 2021-07-31 NOTE — Progress Notes (Signed)
1:1 Note Pt observed ambulating on the hallway using a walker. Pt is unsteady on his feet, staff by his side for safety. Pt reported a good day, appetite not so good. Pt attended the wrap up group and stayed up in the dayroom until the it closes at 2200. Pt took his night medications without any problem, denied SI/HI and contracted for safety. Remains on 1:1 for safety, will continue to monitor.
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
## 2021-07-31 NOTE — BHH Group Notes (Signed)
Adult Psychoeducational Group Not Date:  07/31/2021 Time:  2620-3559 Group Topic/Focus: PROGRESSIVE RELAXATION. A group where deep breathing is taught and tensing and relaxation muscle groups is used. Imagery is used as well.  Pts are asked to imagine 3 pillars that hold them up when they are not able to hold themselves up and to share that with the group.  Participation Level:  Active  Participation Quality:  Appropriate  Affect:  Appropriate  Cognitive:  Oriented  Insight: Improving  Engagement in Group:  Engaged  Modes of Intervention:  Activity, Discussion, Education, and Support  Additional Comments:  Rates his energy at a 4/10. States his children and his grandchildren hold him up  Vincent [name] A

## 2021-07-31 NOTE — Progress Notes (Signed)
   07/31/21 2200  Psych Admission Type (Psych Patients Only)  Admission Status Involuntary  Psychosocial Assessment  Patient Complaints Anxiety  Eye Contact Fair  Facial Expression Flat  Affect Appropriate to circumstance  Speech Logical/coherent  Interaction Assertive  Motor Activity Slow;Unsteady;Tremors  Appearance/Hygiene Disheveled  Mood Pleasant  Thought Process  Coherency WDL  Content WDL  Delusions None reported or observed  Perception WDL  Hallucination None reported or observed  Judgment Poor  Confusion None  Danger to Self  Current suicidal ideation? Denies  Agreement Not to Harm Self Yes  Description of Agreement verbal  Danger to Others  Danger to Others None reported or observed

## 2021-07-31 NOTE — Progress Notes (Signed)
Inpatient Diabetes Program Recommendations  AACE/ADA: New Consensus Statement on Inpatient Glycemic Control (2015)  Target Ranges:  Prepandial:   less than 140 mg/dL      Peak postprandial:   less than 180 mg/dL (1-2 hours)      Critically ill patients:  140 - 180 mg/dL   Lab Results  Component Value Date   GLUCAP 214 (H) 07/30/2021   HGBA1C 14.1 (H) 07/28/2021    Review of Glycemic Control  Diabetes history: DM 2 Outpatient Diabetes medications: Humalog 15 units qam, and hs, metformin 500 mg bid Current orders for Inpatient glycemic control:  Metformin 500 mg bid Novolog 15 units bid  Novolog 0-15 units tid  Inpatient Diabetes Program Recommendations:    Agree with current insulin orders. Will follow glucose trends for now. Titrate if needed.   Thanks,  Tama Headings RN, MSN, BC-ADM Inpatient Diabetes Coordinator Team Pager [phone] (8a-5p)

## 2021-07-31 NOTE — Progress Notes (Signed)
1:1 Note 1500  Patient has been sleeping this afternoon.  Respirations even and unlabored.  No signs/symptoms of pain/distress noted on patient's face/body movements.  Safety maintained with 1:1 per MD orders.

## 2021-07-31 NOTE — Progress Notes (Signed)
Pt left via wheelchair with LE escort for [facility] facility at [place]. Pt belongings in bags were sent with pt and LEO.

## 2021-07-31 NOTE — Progress Notes (Signed)
1:1 Note 9412   Patient went to visitation after dinner.  Respirations even and unlabored.  No signs/symptoms of pain/distress noted on patient's face/body movements.  1:1 continues for safety per MD orders.

## 2021-07-31 NOTE — Progress Notes (Addendum)
1:1 Note 1100  Patient has been out of bed to call wife this morning.  Patient came to medication window for this morning medication.  Patient continues to be very tremulous this morning, difficult to hold water.  Patient has been ambulating with walker this morning  1:1 present for safety per MD order.

## 2021-07-31 NOTE — Progress Notes (Signed)
[place] Group Notes:  (Nursing/MHT/Case Management/Adjunct)  Date:  07/31/2021  Time:  2015  Type of Therapy:   wrap up group  Participation Level:  Active  Participation Quality:  Appropriate, Attentive, Sharing, and Supportive  Affect:  Depressed  Cognitive:  Alert  Insight:  Improving  Engagement in Group:  Engaged  Modes of Intervention:  Clarification, Education, and Support  Summary of Progress/Problems: Positive thinking and self-care were discussed.   Shellia Cleverly 07/31/2021, 8:58 PM

## 2021-07-31 NOTE — Progress Notes (Signed)
Nursing 1:1  Pt resting in bed with eyes closed, respirations even and unlabored.  No distress noted.  Will continue to monitor.Pt is 1:1 for safety.

## 2021-07-31 NOTE — Progress Notes (Signed)
1:1 Note 1300    Patient ate approximately 25% of his lunch.  Respirations even and unlabored.  No signs/symptoms of pain/distress noted on patient's face/body movements.  After lunch, patient laid on his bed and went to sleep.  1:1 continues for patient's safety.  Patient has been using walker to ambulate in room and hallway.

## 2021-07-31 NOTE — Tx Team (Signed)
Initial Treatment Plan 07/31/2021 2:39 AM Vincent [name] BTD:[id number]    PATIENT STRESSORS: Health problems   Marital or family conflict     PATIENT STRENGTHS: Average or above average intelligence  Communication skills  Supportive family/friends    PATIENT IDENTIFIED PROBLEMS: Depression  Suicidal Ideation        "Get to the root of my thinking, understand my feelings"           DISCHARGE CRITERIA:  Improved stabilization in mood, thinking, and/or behavior Medical problems require only outpatient monitoring Motivation to continue treatment in a less acute level of care Need for constant or close observation no longer present Verbal commitment to aftercare and medication compliance  PRELIMINARY DISCHARGE PLAN: Outpatient therapy Return to previous living arrangement  PATIENT/FAMILY INVOLVEMENT: This treatment plan has been presented to and reviewed with the patient, Vincent [name]. The patient and family have been given the opportunity to ask questions and make suggestions.  Margaretann Loveless, RN 07/31/2021, 2:39 AM

## 2021-07-31 NOTE — BHH Group Notes (Signed)
.  Psychoeducational Group Note    Date:  5/21//23 Time: 1300-1400    Purpose of Group: . The group focus' on teaching patients on how to identify their needs and their Life Skills:  [name] group where two lists are made. What people need and what are things that we do that are unhealthy. The lists are developed by the patients and it is explained that we often do the actions that are not healthy to get our list of needs met.  Goal:: to develop the coping skills needed to get their needs met  Participation Level: did not attend  Vincent [name]  

## 2021-07-31 NOTE — Plan of Care (Signed)
Nurse discussed anxiety, depression and coping skills with patient.  

## 2021-07-31 NOTE — Progress Notes (Addendum)
1:1 Note 9:00 am   Patient ate 100% breakfast while sitting on bench in his room.  Patient stated he slept 5 hrs last night.  Denied SI and HI, contracts for safety.  Denied A/V hallucinations.   Walker in his room for use.  1:1 continues for safety per MD order.   Patient is very tremulous, can barely holding glass of water to drink with his medications.

## 2021-08-01 ENCOUNTER — Encounter (HOSPITAL_COMMUNITY): Payer: Self-pay

## 2021-08-01 DIAGNOSIS — F333 Major depressive disorder, recurrent, severe with psychotic symptoms: Secondary | ICD-10-CM

## 2021-08-01 LAB — VITAMIN D 25 HYDROXY (VIT D DEFICIENCY, FRACTURES): Vit D, 25-Hydroxy: 41.36 ng/mL (ref 30–100)

## 2021-08-01 LAB — RPR: RPR Ser Ql: NONREACTIVE

## 2021-08-01 LAB — HEPATITIS PANEL, ACUTE
HCV Ab: NONREACTIVE
Hep A IgM: NONREACTIVE
Hep B C IgM: NONREACTIVE
Hepatitis B Surface Ag: NONREACTIVE

## 2021-08-01 LAB — GLUCOSE, CAPILLARY
Glucose-Capillary: 208 mg/dL — ABNORMAL HIGH (ref 70–99)
Glucose-Capillary: 76 mg/dL (ref 70–99)
Glucose-Capillary: 212 mg/dL — ABNORMAL HIGH (ref 70–99)
Glucose-Capillary: 189 mg/dL — ABNORMAL HIGH (ref 70–99)

## 2021-08-01 MED ORDER — OMEGA-3-ACID ETHYL ESTERS 1 G PO CAPS
1.0000 g | ORAL_CAPSULE | Freq: Two times a day (BID) | ORAL | Status: DC
  Administered 2021-08-02 – 2021-08-04 (×5): 1 g via ORAL
  Filled 2021-08-01 (×9): qty 1

## 2021-08-01 MED ORDER — AMANTADINE HCL 100 MG PO CAPS
100.0000 mg | ORAL_CAPSULE | Freq: Every day | ORAL | Status: DC
  Administered 2021-08-02 – 2021-08-04 (×3): 100 mg via ORAL
  Filled 2021-08-01 (×5): qty 1
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
## 2021-08-01 NOTE — Progress Notes (Signed)
1:1 Note 5:58 pm    Patient ate 100% of his dinner.  Respirations even and unlabored.  No signs/symptoms of pain/distress noted on patient's face/body movements.  CBG was 212 and patient received total of 20 units novolog.  Patient has been pleasant and cooperative and wants to discharge home as soon as he can.    Patient stated his wife plans to visit him tonight.  1:1 continues for patient's safety.

## 2021-08-01 NOTE — Group Note (Signed)
Recreation Therapy Group Note   Group Topic:Stress Management  Group Date: 08/01/2021 Start Time: 0940 End Time: 0953 Facilitators: Victorino Sparrow, LRT,CTRS Location: [address] Dayroom   Goal Area(s) Addresses:  Patient will actively participate in stress management techniques presented during session.  Patient will successfully identify benefit of practicing stress management post d/c.   Group Description: Guided Imagery. LRT provided education, instruction, and demonstration on practice of visualization via guided imagery. Patient was asked to participate in the technique introduced during session. LRT debriefed including topics of mindfulness, stress management and specific scenarios each patient could use these techniques. Patients were given suggestions of ways to access scripts post d/c and encouraged to explore Youtube and other apps available on smartphones, tablets, and computers.   Affect/Mood: Appropriate   Participation Level: Engaged   Participation Quality: Independent   Behavior: Appropriate   Speech/Thought Process: Focused   Insight: Good   Judgement: Good   Modes of Intervention: Script, Soft Music   Patient Response to Interventions:  Engaged   Education Outcome:  Acknowledges education and In group clarification offered    Clinical Observations/Individualized Feedback: Pt attended and participated in group session.     Plan: Continue to engage patient in RT group sessions 2-3x/week.   Victorino Sparrow, LRT,CTRS 08/01/2021 11:00 AM

## 2021-08-01 NOTE — Plan of Care (Signed)
  Problem: Acute Rehab OT Goals (only OT should resolve) Goal: Pt. Will Perform Upper Body Dressing 08/01/2021 1726 by Brantley Stage, OT Outcome: Progressing 08/01/2021 1724 by Brantley Stage, OT Outcome: Progressing   Problem: Acute Rehab OT Goals (only OT should resolve) Goal: Pt. Will Perform Lower Body Dressing 08/01/2021 1726 by Brantley Stage, OT Outcome: Progressing 08/01/2021 1724 by Brantley Stage, OT Outcome: Progressing   Problem: Acute Rehab OT Goals (only OT should resolve) Goal: Pt. Will Perform Upper Body Dressing Outcome: Progressing   Problem: Acute Rehab OT Goals (only OT should resolve) Goal: Pt. Will Perform Lower Body Dressing Outcome: Progressing

## 2021-08-01 NOTE — [facility] Group Notes (Signed)
Pt attended AA group 

## 2021-08-01 NOTE — Progress Notes (Signed)
1:1 Note Pt is up this morning feeling better, stated he slept well last night, although stated his legs and hands felt numb. Pt is tremulous and encouraged to take his time standing up. No fall witnessed or reported, denies SI/HI and contracted for safety, remains on 1:1 for safety. Will continue to monitor.

## 2021-08-01 NOTE — Plan of Care (Signed)
Nurse discussed anxiety, depression and coping skills with patient.  

## 2021-08-01 NOTE — Progress Notes (Addendum)
1:1 Note 1000  Patient was in his room earlier this morning.  Has attended morning group.  Respirations even and unlabored.  No signs/symptoms of pain/distress noted on patient's face/body movements.  1:1 continues per MD order for safety.

## 2021-08-01 NOTE — Progress Notes (Signed)
1:1 Note 1600   Patient has attended all afternoon groups.  Has been ambulating with walker in his room and hallway.  Patient denied SI and HI, contracts for safety.  Denied A/V hallucinations.  Patient took afternoon nap.  1:1 continues for patient's safety per MD order.

## 2021-08-01 NOTE — Group Note (Signed)
[facility] LCSW Group Therapy Note   Group Date: 08/01/2021 Start Time: 1300 End Time: 1400   Type of Therapy/Topic:  Group Therapy:  Emotion Regulation  Participation Level:  Did Not Attend   Description of Group:    The purpose of this group is to assist patients in learning to regulate negative emotions and experience positive emotions. Patients will be guided to discuss ways in which they have been vulnerable to their negative emotions. These vulnerabilities will be juxtaposed with experiences of positive emotions or situations, and patients challenged to use positive emotions to combat negative ones. Special emphasis will be placed on coping with negative emotions in conflict situations, and patients will process healthy conflict resolution skills.  Therapeutic Goals: Patient will identify two positive emotions or experiences to reflect on in order to balance out negative emotions:  Patient will label two or more emotions that they find the most difficult to experience:  Patient will be able to demonstrate positive conflict resolution skills through discussion or role plays:   Summary of Patient Progress:   Patient did not attend group despite encouraged participation.     Therapeutic Modalities:   Cognitive Behavioral Therapy Feelings Identification Dialectical Behavioral Therapy   Durenda Hurt, [place]

## 2021-08-01 NOTE — Progress Notes (Signed)
1:1 Note 0800 Patient ate 100% breakfast.  Has been up in his room using walker.  Respirations even and unlabored.  No sign/symptoms of pain/distress noted on patient's face/body movements.  Safety maintained with 1:1 for safety.

## 2021-08-01 NOTE — Progress Notes (Signed)
[facility] MD Progress Note  08/01/2021 4:54 PM Vincent [name]  MRN:  [id number]  History of Present Illness: Vincent [name] is a 56 YO M with a history of depression, Bipolar II disorder, chronic pain, altered mental status and multiple medical issues who was admitted to the medical service s/p intentional overdose.   24 hour EMR reviewed. Case discussed in progression rounds. Per nursing report, the patient is up and ambulating. 1:1 continues due to significant fall risk. He has been going to groups. Medication compliant.   Subjective:  [name] was seen this afternoon. He appeared modestly brighter today. He feels that he is doing "pretty good" overall. He is eating. He slept fair. He feels that his pain level is "about the same" as yesterday. He is able to get up, use walker and ambulate without assist. No hallucinations, thoughts of harm to self or others. He is able to talk about future events with long term goals. [place]. Discussed some recent advancements in psychiatric treatment and some that are on the horizon and he seemed engaged.   Principal Problem: Major depressive disorder, recurrent, severe with psychotic symptoms ([place]) Diagnosis: Principal Problem:   Major depressive disorder, recurrent, severe with psychotic symptoms ([place]) Active Problems:   Bipolar 2 disorder (HCC)   Chronic pain   Psychophysiological insomnia   Chronic post-traumatic stress disorder (PTSD)   Polypharmacy   Stage 3b chronic kidney disease (CKD) ([place])  Total Time spent with patient: 20 minutes  Past Psychiatric History: previous inpatient treatment at [facility] and [place]. Denies previous suicide attempt. Previously diagnosed with depression, anxiety, bipolar II.  Previously on remeron, cymbalta, buspar, prozac, reglan, trazodone, lithium, lamictal and abilify  Past Medical History:  Past Medical History:  Diagnosis Date   Anxiety    Benign essential tremor    Bipolar 2 disorder ([place])     followed by [facility]--- dr s. Adele Schilder   Bladder cancer [facility])    recurrent   CAD (coronary artery disease)    cardiac cath 2003  and 2011 both showed normal coronary arteries w/ preserved lvf;  Non obstructive on CTA Oct 2019.    Chronic pain syndrome    back---- [facility] [place]   Cold extremities    BLE   COPD (chronic obstructive pulmonary disease) ([place])    DDD (degenerative disc disease), lumbar    Diabetic peripheral neuropathy ([place])    Gastroparesis    followed by dr Henrene Pastor   GERD (gastroesophageal reflux disease)    Hiatal hernia    History of bladder cancer urologist-  previously dr Consuella Lose;  now dr gay   papillay TCC (Ta G1)  s/p TURBT and chemo instillation 2014   History of chest pain 12/2017   heart cath normal   History of encephalopathy 05/27/2015   admission w/ acute encephalopathy thought to be secondary to pain meds and COPD   History of gastric ulcer    History of Helicobacter pylori infection    History of kidney stones    History of TIA (transient ischemic attack) 2008  and 10-19-2018    no residual's   History of traumatic head injury 2010   w/ LOC  per pt needed stitches, hit in head with a mower blade   Hyperlipidemia    Hypertension    Hypogonadism male    s/p  bilateral orchiectomy   Hypothyroidism    Insomnia    Mild obstructive sleep apnea    study in  epic 12-04-2016, no cpap   PTSD (post-traumatic stress disorder)    chronic   PTSD (post-traumatic stress disorder)    RA (rheumatoid arthritis) ([place])    follow[facility] by [facility]   Seizures, transient [facility]) neurologist-  dr Krista Blue--  differential dx complex partial seizure .vs.  mood disorder .vs.  pseudoseizure--  negative EEG's   confusion episodes and staring spells since 11/ 2015   (03-26-2020 per pt wife last seizure 10 /2021)   Transient confusion NEUOROLOGIST-  DR Krista Blue   Episodes since 11/ 2015--  neurologist dx  differential complex partial seizure  .vs. mood disorder .  vs. pseudoseizure   Type 2 diabetes mellitus treated with insulin [facility])    endocrinologist--- dr Loanne Drilling---  (03-26-2020 pt does not check blood sugar at home)    Past Surgical History:  Procedure Laterality Date   AMPUTATION Left 04/28/2020   Procedure: LEFT LITTLE FINGER AMPUTATION;  Surgeon: Newt Minion, MD;  Location: [place];  Service: Orthopedics;  Laterality: Left;   CARDIAC CATHETERIZATION  12-27-2001  DR Einar Gip  &  05-26-2009  DR Irish Lack   RESULTS FOR BOTH ARE NORMAL CORONARIES AND PERSERVED LVF/ EF 60%   CARPAL TUNNEL RELEASE Bilateral right 09-16-2003;  left ?   CARPAL TUNNEL RELEASE Left 02/25/2015   Procedure: LEFT CARPAL TUNNEL RELEASE;  Surgeon: Leanora Cover, MD;  Location: [place];  Service: Orthopedics;  Laterality: Left;   CYSTOSCOPY N/A 10/10/2012   Procedure: CYSTOSCOPY CLOT EVACUATION FULGERATION OF BLEEDERS ;  Surgeon: Claybon Jabs, MD;  Location: [facility];  Service: Urology;  Laterality: N/A;   CYSTOSCOPY WITH BIOPSY N/A 11/26/2015   Procedure: CYSTOSCOPY WITH BIOPSY AND FULGURATION;  Surgeon: Kathie Rhodes, MD;  Location: [place];  Service: Urology;  Laterality: N/A;   ESOPHAGOGASTRODUODENOSCOPY  2014   LAPAROSCOPIC CHOLECYSTECTOMY  11-17-2010   ORCHIECTOMY Right 02/21/2016   Procedure: SCROTAL ORCHIECTOMY with TESTICULAR PROSTHESIS IMPLANT;  Surgeon: Kathie Rhodes, MD;  Location: [facility];  Service: Urology;  Laterality: Right;   ORCHIECTOMY Left 09/02/2018   Procedure: ORCHIECTOMY;  Surgeon: Kathie Rhodes, MD;  Location: [facility];  Service: Urology;  Laterality: Left;   ROTATOR CUFF REPAIR Right 12/2004   TRANSURETHRAL RESECTION OF BLADDER TUMOR N/A 08/09/2012   Procedure: TRANSURETHRAL RESECTION OF BLADDER TUMOR (TURBT) WITH GYRUS WITH MITOMYCIN C;  Surgeon: Claybon Jabs, MD;  Location: [facility];  Service: Urology;  Laterality: N/A;   TRANSURETHRAL RESECTION  OF BLADDER TUMOR N/A 03/29/2020   Procedure: TRANSURETHRAL RESECTION OF BLADDER TUMOR (TURBT) and post-op instillation of gemcitabine;  Surgeon: Janith Lima, MD;  Location: [facility];  Service: Urology;  Laterality: N/A;   TRANSURETHRAL RESECTION OF BLADDER TUMOR WITH GYRUS (TURBT-GYRUS) N/A 02/27/2014   Procedure: TRANSURETHRAL RESECTION OF BLADDER TUMOR WITH GYRUS (TURBT-GYRUS);  Surgeon: Claybon Jabs, MD;  Location: [facility];  Service: Urology;  Laterality: N/A;   Family History:  Family History  Problem Relation Age of Onset   Diabetes Mother    Diabetes Father    Hypertension Father    Heart attack Father 19       died age 23   Alcohol abuse Father    Colon cancer Neg Hx    Esophageal cancer Neg Hx    Stomach cancer Neg Hx    Rectal cancer Neg Hx    Family Psychiatric  History: denies Social History:  Social History  Substance and Sexual Activity  Alcohol Use No     Social History   Substance and Sexual Activity  Drug Use No    Social History   Socioeconomic History   Marital status: Married    Spouse name: Not on file   Number of children: 3   Years of education: GED   Highest education level: Not on file  Occupational History   Occupation: Engineer, technical sales    Comment: Owner of company  Tobacco Use   Smoking status: Every Day    Packs/day: 1.50    Years: 38.00    Pack years: 57.00    Types: Cigarettes   Smokeless tobacco: Never  Vaping Use   Vaping Use: Never used  Substance and Sexual Activity   Alcohol use: No   Drug use: No   Sexual activity: Not Currently    Partners: Female    Birth control/protection: None  Other Topics Concern   Not on file  Social History Narrative   Lives at home with his wife and children.   Left-handed.   3-4 cups caffeine per day.   Social Determinants of Health   Financial Resource Strain: Not on file  Food Insecurity: Not on file  Transportation Needs: Not on  file  Physical Activity: Not on file  Stress: Not on file  Social Connections: Not on file   Additional Social History:                         Sleep: Fair  Appetite:  Fair  Current Medications: Current Facility-Administered Medications  Medication Dose Route Frequency Provider Last Rate Last Admin   acetaminophen (TYLENOL) tablet 650 mg  650 mg Oral Q6H PRN Massengill, Ovid Curd, MD       amLODipine (NORVASC) tablet 5 mg  5 mg Oral Daily Massengill, Nathan, MD   5 mg at 08/01/21 0900   aspirin EC tablet 81 mg  81 mg Oral Daily Massengill, Ovid Curd, MD   81 mg at 08/01/21 0900   clopidogrel (PLAVIX) tablet 75 mg  75 mg Oral Daily Massengill, Ovid Curd, MD   75 mg at 08/01/21 0900   insulin aspart (novoLOG) injection 0-15 Units  0-15 Units Subcutaneous TID WC Massengill, Ovid Curd, MD   5 Units at 08/01/21 1324   insulin aspart (novoLOG) injection 15 Units  15 Units Subcutaneous BID WC Massengill, Ovid Curd, MD   15 Units at 08/01/21 0900   lamoTRIgine (LAMICTAL) tablet 150 mg  150 mg Oral BID Massengill, Ovid Curd, MD   150 mg at 08/01/21 0900   lidocaine (LIDODERM) 5 % 1 patch  1 patch Transdermal Daily PRN Massengill, Ovid Curd, MD       Lurasidone HCl TABS 60 mg  60 mg Oral Q breakfast Massengill, Nathan, MD   60 mg at 08/01/21 0900   metFORMIN (GLUCOPHAGE-XR) 24 hr tablet 500 mg  500 mg Oral BID Massengill, Ovid Curd, MD   500 mg at 08/01/21 0900   morphine (MS CONTIN) 12 hr tablet 60 mg  60 mg Oral BID Massengill, Ovid Curd, MD   60 mg at 08/01/21 0900   nicotine (NICODERM CQ - dosed in mg/24 hours) patch 21 mg  21 mg Transdermal Daily Bobbitt, Shalon E, NP   21 mg at 08/01/21 0900   pantoprazole (PROTONIX) EC tablet 40 mg  40 mg Oral Daily Massengill, Nathan, MD   40 mg at 08/01/21 0900   pregabalin (LYRICA) capsule 225 mg  225 mg Oral q morning  Janine Limbo, MD   225 mg at 08/01/21 1057   rosuvastatin (CRESTOR) tablet 10 mg  10 mg Oral Daily Massengill, Nathan, MD   10 mg at 08/01/21 0900    tamsulosin (FLOMAX) capsule 0.8 mg  0.8 mg Oral QHS Massengill, Ovid Curd, MD   0.8 mg at 07/31/21 2118   traZODone (DESYREL) tablet 100 mg  100 mg Oral QHS PRN Massengill, Ovid Curd, MD   100 mg at 07/31/21 2118   venlafaxine XR (EFFEXOR-XR) 24 hr capsule 150 mg  150 mg Oral Q breakfast Massengill, Ovid Curd, MD   150 mg at 08/01/21 0900    Lab Results:  Results for orders placed or performed during the hospital encounter of 07/31/21 (from the past 48 hour(s))  Glucose, capillary     Status: Abnormal   Collection Time: 07/31/21  6:07 AM  Result Value Ref Range   Glucose-Capillary 180 (H) 70 - 99 mg/dL    Comment: Glucose reference range applies only to samples taken after fasting for at least 8 hours.   Comment 1 Notify RN   Glucose, capillary     Status: None   Collection Time: 07/31/21 12:01 PM  Result Value Ref Range   Glucose-Capillary 90 70 - 99 mg/dL    Comment: Glucose reference range applies only to samples taken after fasting for at least 8 hours.  Glucose, capillary     Status: Abnormal   Collection Time: 07/31/21  5:17 PM  Result Value Ref Range   Glucose-Capillary 149 (H) 70 - 99 mg/dL    Comment: Glucose reference range applies only to samples taken after fasting for at least 8 hours.  Comprehensive metabolic panel     Status: Abnormal   Collection Time: 07/31/21  6:33 PM  Result Value Ref Range   Sodium 138 135 - 145 mmol/L   Potassium 3.6 3.5 - 5.1 mmol/L   Chloride 107 98 - 111 mmol/L   CO2 23 22 - 32 mmol/L   Glucose, Bld 210 (H) 70 - 99 mg/dL    Comment: Glucose reference range applies only to samples taken after fasting for at least 8 hours.   BUN 13 6 - 20 mg/dL   Creatinine, Ser 0.96 0.61 - 1.24 mg/dL   Calcium 8.9 8.9 - 10.3 mg/dL   Total Protein 6.8 6.5 - 8.1 g/dL   Albumin 3.8 3.5 - 5.0 g/dL   AST 16 15 - 41 U/L   ALT 14 0 - 44 U/L   Alkaline Phosphatase 58 38 - 126 U/L   Total Bilirubin 0.6 0.3 - 1.2 mg/dL   GFR, Estimated >60 >60 mL/min    Comment:  (NOTE) Calculated using the CKD-EPI Creatinine Equation (2021)    Anion gap 8 5 - 15    Comment: Performed at [facility], [place] [address], [place], [place] [address]  CBC with Differential/Platelet     Status: None   Collection Time: 07/31/21  6:33 PM  Result Value Ref Range   WBC 5.0 4.0 - 10.5 K/uL   RBC 5.29 4.22 - 5.81 MIL/uL   Hemoglobin 14.6 13.0 - 17.0 g/dL   HCT 43.5 39.0 - 52.0 %   MCV 82.2 80.0 - 100.0 fL   MCH 27.6 26.0 - 34.0 pg   MCHC 33.6 30.0 - 36.0 g/dL   RDW 12.8 11.5 - 15.5 %   Platelets 242 150 - 400 K/uL   nRBC 0.0 0.0 - 0.2 %   Neutrophils Relative % 54 %  Neutro Abs 2.7 1.7 - 7.7 K/uL   Lymphocytes Relative 34 %   Lymphs Abs 1.7 0.7 - 4.0 K/uL   Monocytes Relative 7 %   Monocytes Absolute 0.4 0.1 - 1.0 K/uL   Eosinophils Relative 4 %   Eosinophils Absolute 0.2 0.0 - 0.5 K/uL   Basophils Relative 1 %   Basophils Absolute 0.1 0.0 - 0.1 K/uL   Immature Granulocytes 0 %   Abs Immature Granulocytes 0.02 0.00 - 0.07 K/uL    Comment: Performed at [facility], [place] [address] [place], [place] [address]  RPR     Status: None   Collection Time: 07/31/21  6:33 PM  Result Value Ref Range   RPR Ser Ql NON REACTIVE NON REACTIVE    Comment: Performed at [facility], [place] [address], [place], [place] [address]  TSH     Status: None   Collection Time: 07/31/21  6:33 PM  Result Value Ref Range   TSH 4.116 0.350 - 4.500 uIU/mL    Comment: Performed by a 3rd Generation assay with a functional sensitivity of <=0.01 uIU/mL. Performed at [facility], [place] [address] [place], [place] [address]   VITAMIN D 25 Hydroxy (Vit-D Deficiency, Fractures)     Status: None   Collection Time: 07/31/21  6:33 PM  Result Value Ref Range   Vit D, 25-Hydroxy 41.36 30 - 100 ng/mL    Comment: (NOTE) Vitamin D deficiency has been defined by the [place] practice guideline as a level of  serum 25-OH  vitamin D less than 20 ng/mL (1,2). The Endocrine Society went on to  further define vitamin D insufficiency as a level between 21 and 29  ng/mL (2).  1. IOM (Institute of Medicine). 2010. Dietary reference intakes for  calcium and D. [place]: The [employer]. 2. Holick MF, Binkley Pandora, Bischoff-Ferrari HA, et al. Evaluation,  treatment, and prevention of vitamin D deficiency: an Endocrine  Society clinical practice guideline, JCEM. 2011 Jul; 96(7): 1911-30.  Performed at [facility], [place] [address], [place], [place] [address]   Vitamin B12     Status: None   Collection Time: 07/31/21  6:33 PM  Result Value Ref Range   Vitamin B-12 485 180 - 914 pg/mL    Comment: (NOTE) This assay is not validated for testing neonatal or myeloproliferative syndrome specimens for Vitamin B12 levels. Performed at [facility], [place] [address], [place], [place] [address]   Hepatitis panel, acute     Status: None   Collection Time: 07/31/21  6:33 PM  Result Value Ref Range   Hepatitis B Surface Ag NON REACTIVE NON REACTIVE   HCV Ab NON REACTIVE NON REACTIVE    Comment: (NOTE) Nonreactive HCV antibody screen is consistent with no HCV infections,  unless recent infection is suspected or other evidence exists to indicate HCV infection.     Hep A IgM NON REACTIVE NON REACTIVE   Hep B C IgM NON REACTIVE NON REACTIVE    Comment: Performed at [facility], [place] [address] [place], [place] [address]  Glucose, capillary     Status: Abnormal   Collection Time: 07/31/21  7:30 PM  Result Value Ref Range   Glucose-Capillary 149 (H) 70 - 99 mg/dL    Comment: Glucose reference range applies only to samples taken after fasting for at least 8 hours.  Glucose, capillary  Status: Abnormal   Collection Time: 08/01/21  5:27 AM  Result Value Ref Range   Glucose-Capillary 208 (H) 70 - 99 mg/dL    Comment: Glucose reference range applies only to  samples taken after fasting for at least 8 hours.  Glucose, capillary     Status: None   Collection Time: 08/01/21 12:04 PM  Result Value Ref Range   Glucose-Capillary 76 70 - 99 mg/dL    Comment: Glucose reference range applies only to samples taken after fasting for at least 8 hours.    Blood Alcohol level:  Lab Results  Component Value Date   ETH <10 07/28/2021   ETH <10 10/62/6948    Metabolic Disorder Labs: Lab Results  Component Value Date   HGBA1C 14.1 (H) 07/28/2021   MPG 357.97 07/28/2021   MPG 291.96 11/14/2020   No results found for: PROLACTIN Lab Results  Component Value Date   CHOL 240 (H) 11/16/2020   TRIG 325 (H) 11/16/2020   HDL 38 (L) 11/16/2020   CHOLHDL 6.3 11/16/2020   VLDL 65 (H) 11/16/2020   LDLCALC 137 (H) 11/16/2020   LDLCALC 59 06/11/2020    Physical Findings: AIMS:  , ,  ,  ,    CIWA:    COWS:     Musculoskeletal: Strength & Muscle Tone: decreased Gait & Station: unsteady Patient leans: Front  Psychiatric Specialty Exam:  Presentation  General Appearance: Casual; Appropriate for Environment  Eye Contact:Good  Speech:Normal Rate  Speech Volume:Normal  Handedness:Right   Mood and Affect  Mood:Depressed  Affect:Depressed   Thought Process  Thought Processes:Linear  Descriptions of Associations:Intact  Orientation:Full (Time, Place and Person)  Thought Content:Logical  History of Schizophrenia/Schizoaffective disorder:No data recorded Duration of Psychotic Symptoms:No data recorded Hallucinations:Hallucinations: None  Ideas of Reference:None  Suicidal Thoughts:Suicidal Thoughts: No SI Passive Intent and/or Plan: Without Intent; Without Access to Means  Homicidal Thoughts:Homicidal Thoughts: No   Sensorium  Memory:Immediate Fair; Recent Poor; Remote Fair (does not recall overdose event)  Judgment:Fair  Insight:Fair   Executive Functions  Concentration:Fair  Attention Span:Good  [place]   Psychomotor Activity  Psychomotor Activity:Psychomotor Activity: Decreased; Tremor; Shuffling Gait   Assets  Assets:Desire for Improvement   Sleep  Sleep:Sleep: Fair    Physical Exam: Physical Exam Vitals and nursing note reviewed.  HENT:     Head: Normocephalic.  Eyes:     Extraocular Movements: Extraocular movements intact.  Cardiovascular:     Rate and Rhythm: Normal rate.  Pulmonary:     Effort: Pulmonary effort is normal.  Musculoskeletal:     Cervical back: Normal range of motion.  Skin:    Coloration: Skin is pale.  Neurological:     Mental Status: He is alert and oriented to person, place, and time.     Motor: Weakness present.   Review of Systems  Constitutional:  Negative for chills and fever.  Gastrointestinal:  Negative for vomiting.  Musculoskeletal:  Positive for back pain, joint pain and myalgias.  Skin:  Negative for rash.  Neurological:  Positive for tremors and weakness.  Psychiatric/Behavioral:  Positive for depression. Negative for hallucinations.   Blood pressure 115/73, pulse 81, temperature 98.2 F (36.8 C), temperature source Oral, resp. rate 18, height 6' (1.829 m), weight 101.6 kg, SpO2 100 %. Body mass index is 30.38 kg/m.   Treatment Plan Summary: Daily contact with patient to assess and evaluate symptoms and progress in treatment and Medication management Medications: Mood/anxiety: continue group  therapy, milieu therapy, 1:1 evaluation with provider.  Effexor XR '150mg'$  PO daily for mood and anxiety Lurasidone '60mg'$  PO with breakfast Trazodone '100mg'$  PO QHS PRN PTSD: Group, milieu therapy, 1:1 evaluation with provider.   Chronic pain:  Lidoderm patch daily Lamictal '150mg'$  PO BID MS contin  '60mg'$  BID Medical: PRNs for pain, constipation, indigestion available.  Labs reviewed: hep panel neg, TSH neg, RPR neg, nutrition wnl, sugar 210 cmp otherwise wnl Start amantadine '100mg'$  PO daily for  tremor Start lovanza daily for dietary support Safety and Monitoring: In/voluntary admission to inpatient psychiatric unit for safety, stabilization and treatment Daily contact with patient to assess and evaluate symptoms and progress in treatment Patient's case to be discussed in multi-disciplinary team meeting Observation Level : q15 minute checks Vital signs: q12 hours Precautions: suicide, fall   Maida Sale, MD 08/01/2021, 4:54 PM

## 2021-08-01 NOTE — Progress Notes (Signed)
1:1 Note 1146  Patient's self inventory sheet, patient sleeps good, sleep medication helpful.  Good appetite, low energy level, good concentration.  Rated depression 3, anxiety 2, denied hopeless.  Denied withdrawals.  Denied SI.  Goal is complete therapy.  Fell better about m yself.  Wants therapy group and support to understand behavior.  Plans to discharge home.  Patient is attending group at this time.  Ambulates with walker.  No problems at this time.  1:1 continues for safety per MD orders.

## 2021-08-01 NOTE — [facility] Suicide Risk Assessment (Signed)
[facility] INPATIENT:  Family/Significant Other Suicide Prevention Education  Suicide Prevention Education:  Education Completed; Alee Katen, spouse,  [phone] has been identified by the patient as the family member/significant other with whom the patient will be residing, and identified as the person(s) who will aid the patient in the event of a mental health crisis (suicidal ideations/suicide attempt).  With written consent from the patient, the family member/significant other has been provided the following suicide prevention education, prior to the and/or following the discharge of the patient.  During conversation with spouse, she expressed concerns regarding statements made to her while in the ED. States he told her that "he was not finished <with suicide attempt>. Wife has confirmed that all medications have been secured and all firearms have been removed from the home. Further states that she had a conversation with patient on 5/21 to discuss housing arrangements for patient at discharge. States she plans on moving in with her parents so that the patient can return home, patient would then be living alone at discharge.    The suicide prevention education provided includes the following: Suicide risk factors Suicide prevention and interventions National Suicide Hotline telephone number [facility] assessment telephone number [facility] Emergency Assistance [place] and/or Residential Mobile Crisis Unit telephone number  Request made of family/significant other to: Remove weapons (e.g., guns, rifles, knives), all items previously/currently identified as safety concern.   Remove drugs/medications (over-the-counter, prescriptions, illicit drugs), all items previously/currently identified as a safety concern.  The family member/significant other verbalizes understanding of the suicide prevention education information provided.  The family member/significant other agrees  to remove the items of safety concern listed above.  Durenda Hurt 08/01/2021, 12:11 PM

## 2021-08-01 NOTE — BH IP Treatment Plan (Signed)
Interdisciplinary Treatment and Diagnostic Plan Update  08/01/2021 Time of Session: 9:40am BALIN VANDEGRIFT MRN: [id number]  Principal Diagnosis: Major depressive disorder, recurrent, severe with psychotic symptoms ([place])  Secondary Diagnoses: Principal Problem:   Major depressive disorder, recurrent, severe with psychotic symptoms ([place]) Active Problems:   Bipolar 2 disorder ([place])   Chronic pain   Psychophysiological insomnia   Chronic post-traumatic stress disorder (PTSD)   Polypharmacy   Stage 3b chronic kidney disease (CKD) ([place])   Current Medications:  Current Facility-Administered Medications  Medication Dose Route Frequency Provider Last Rate Last Admin   acetaminophen (TYLENOL) tablet 650 mg  650 mg Oral Q6H PRN Massengill, Ovid Curd, MD       amLODipine (NORVASC) tablet 5 mg  5 mg Oral Daily Massengill, Nathan, MD   5 mg at 08/01/21 0900   aspirin EC tablet 81 mg  81 mg Oral Daily Massengill, Ovid Curd, MD   81 mg at 08/01/21 0900   clopidogrel (PLAVIX) tablet 75 mg  75 mg Oral Daily Massengill, Ovid Curd, MD   75 mg at 08/01/21 0900   insulin aspart (novoLOG) injection 0-15 Units  0-15 Units Subcutaneous TID WC Massengill, Ovid Curd, MD   5 Units at 08/01/21 3557   insulin aspart (novoLOG) injection 15 Units  15 Units Subcutaneous BID WC Massengill, Ovid Curd, MD   15 Units at 08/01/21 0900   lamoTRIgine (LAMICTAL) tablet 150 mg  150 mg Oral BID Massengill, Ovid Curd, MD   150 mg at 08/01/21 0900   lidocaine (LIDODERM) 5 % 1 patch  1 patch Transdermal Daily PRN Massengill, Ovid Curd, MD       Lurasidone HCl TABS 60 mg  60 mg Oral Q breakfast Massengill, Ovid Curd, MD   60 mg at 08/01/21 0900   metFORMIN (GLUCOPHAGE-XR) 24 hr tablet 500 mg  500 mg Oral BID Massengill, Ovid Curd, MD   500 mg at 08/01/21 0900   morphine (MS CONTIN) 12 hr tablet 60 mg  60 mg Oral BID Massengill, Ovid Curd, MD   60 mg at 08/01/21 0900   nicotine (NICODERM CQ - dosed in mg/24 hours) patch 21 mg  21 mg Transdermal Daily Bobbitt,  Shalon E, NP   21 mg at 08/01/21 0900   pantoprazole (PROTONIX) EC tablet 40 mg  40 mg Oral Daily Massengill, Ovid Curd, MD   40 mg at 08/01/21 0900   pregabalin (LYRICA) capsule 225 mg  225 mg Oral q morning Massengill, Ovid Curd, MD   225 mg at 07/31/21 1050   rosuvastatin (CRESTOR) tablet 10 mg  10 mg Oral Daily Massengill, Ovid Curd, MD   10 mg at 08/01/21 0900   tamsulosin (FLOMAX) capsule 0.8 mg  0.8 mg Oral QHS Massengill, Ovid Curd, MD   0.8 mg at 07/31/21 2118   traZODone (DESYREL) tablet 100 mg  100 mg Oral QHS PRN Janine Limbo, MD   100 mg at 07/31/21 2118   venlafaxine XR (EFFEXOR-XR) 24 hr capsule 150 mg  150 mg Oral Q breakfast Massengill, Ovid Curd, MD   150 mg at 08/01/21 0900   PTA Medications: Medications Prior to Admission  Medication Sig Dispense Refill Last Dose   acetaminophen (TYLENOL) 325 MG tablet Take 2 tablets (650 mg total) by mouth every 6 (six) hours as needed. 36 tablet 0    amLODipine (NORVASC) 5 MG tablet Take 1 tablet (5 mg total) by mouth daily.      aspirin EC 81 MG EC tablet Take 1 tablet (81 mg total) by mouth daily. Swallow whole. 30 tablet 0  clopidogrel (PLAVIX) 75 MG tablet Take 1 tablet (75 mg total) by mouth daily. 90 tablet 4    Continuous Blood Gluc Sensor (FREESTYLE LIBRE 2 SENSOR) MISC 1 Device by Does not apply route every 14 (fourteen) days. (Patient taking differently: 1 Device by Does not apply route every 14 (fourteen) days. Has not received- waiting on Insurance) 6 each 3    fenofibrate 160 MG tablet Take 160 mg by mouth daily.      HUMALOG KWIKPEN 200 UNIT/ML KwikPen Inject 15 Units into the skin in the morning and at bedtime.      lamoTRIgine (LAMICTAL) 150 MG tablet Take 1 tablet (150 mg total) by mouth 2 (two) times daily. 180 tablet 4    lidocaine (LIDODERM) 5 % Place 1 patch onto the skin daily as needed. Remove & Discard patch within 12 hours or as directed by MD (Patient not taking: Reported on 07/28/2021) 15 patch 0    Lurasidone HCl (LATUDA)  60 MG TABS Take 1 tablet (60 mg total) by mouth daily with breakfast. 30 tablet 2    metFORMIN (GLUCOPHAGE-XR) 500 MG 24 hr tablet Take 500 mg by mouth 2 (two) times daily.      morphine (MS CONTIN) 60 MG 12 hr tablet Take 60 mg by mouth 2 (two) times daily. Every 12 hours      morphine (MSIR) 15 MG tablet Take 15 mg by mouth 2 (two) times daily as needed. For breakthrough pain  (03-26-2020 per pt wife pt takes this twice also      omeprazole (PRILOSEC) 40 MG capsule Take 40 mg by mouth daily.      pregabalin (LYRICA) 225 MG capsule Take 1 capsule by mouth in the morning (Patient taking differently: Take 225 mg by mouth See admin instructions. Pt takes 1 tablet in the morning and 2 tablets at night) 30 capsule 5    rosuvastatin (CRESTOR) 10 MG tablet Take 1 tablet (10 mg total) by mouth daily. (Patient not taking: Reported on 07/28/2021) 30 tablet 0    tamsulosin (FLOMAX) 0.4 MG CAPS capsule Take 0.8 mg by mouth at bedtime.      testosterone cypionate (DEPOTESTOSTERONE CYPIONATE) 200 MG/ML injection Inject 200 mg into the skin every 14 (fourteen) days.      traZODone (DESYREL) 100 MG tablet Take 1 tablet (100 mg total) by mouth at bedtime as needed for sleep. 30 tablet 2    venlafaxine XR (EFFEXOR-XR) 150 MG 24 hr capsule TAKE 1 CAPSULE BY MOUTH ONCE DAILY WITH BREAKFAST (Patient taking differently: 150 mg 2 (two) times daily.) 90 capsule 1    Vitamin D, Ergocalciferol, (DRISDOL) 1.25 MG (50000 UNIT) CAPS capsule Take 50,000 Units by mouth every Wednesday.       Patient Stressors: Health problems   Marital or family conflict    Patient Strengths: Average or above average intelligence  Communication skills  Supportive family/friends   Treatment Modalities: Medication Management, Group therapy, Case management,  1 to 1 session with clinician, Psychoeducation, Recreational therapy.   Physician Treatment Plan for Primary Diagnosis: Major depressive disorder, recurrent, severe with psychotic  symptoms ([place]) Long Term Goal(s): Improvement in symptoms so as ready for discharge   Short Term Goals: Ability to identify changes in lifestyle to reduce recurrence of condition will improve Ability to verbalize feelings will improve Ability to disclose and discuss suicidal ideas Ability to demonstrate self-control will improve Ability to identify and develop effective coping behaviors will improve Ability to maintain clinical measurements within normal  limits will improve Compliance with prescribed medications will improve  Medication Management: Evaluate patient's response, side effects, and tolerance of medication regimen.  Therapeutic Interventions: 1 to 1 sessions, Unit Group sessions and Medication administration.  Evaluation of Outcomes: Progressing  Physician Treatment Plan for Secondary Diagnosis: Principal Problem:   Major depressive disorder, recurrent, severe with psychotic symptoms ([place]) Active Problems:   Bipolar 2 disorder ([facility])   Chronic pain   Psychophysiological insomnia   Chronic post-traumatic stress disorder (PTSD)   Polypharmacy   Stage 3b chronic kidney disease (CKD) ([place])  Long Term Goal(s): Improvement in symptoms so as ready for discharge   Short Term Goals: Ability to identify changes in lifestyle to reduce recurrence of condition will improve Ability to verbalize feelings will improve Ability to disclose and discuss suicidal ideas Ability to demonstrate self-control will improve Ability to identify and develop effective coping behaviors will improve Ability to maintain clinical measurements within normal limits will improve Compliance with prescribed medications will improve     Medication Management: Evaluate patient's response, side effects, and tolerance of medication regimen.  Therapeutic Interventions: 1 to 1 sessions, Unit Group sessions and Medication administration.  Evaluation of Outcomes: Progressing   RN Treatment Plan for Primary  Diagnosis: Major depressive disorder, recurrent, severe with psychotic symptoms ([place]) Long Term Goal(s): Knowledge of disease and therapeutic regimen to maintain health will improve  Short Term Goals: Ability to remain free from injury will improve, Ability to verbalize frustration and anger appropriately will improve, Ability to demonstrate self-control, Ability to participate in decision making will improve, Ability to verbalize feelings will improve, Ability to disclose and discuss suicidal ideas, Ability to identify and develop effective coping behaviors will improve, and Compliance with prescribed medications will improve  Medication Management: RN will administer medications as ordered by provider, will assess and evaluate patient's response and provide education to patient for prescribed medication. RN will report any adverse and/or side effects to prescribing provider.  Therapeutic Interventions: 1 on 1 counseling sessions, Psychoeducation, Medication administration, Evaluate responses to treatment, Monitor vital signs and CBGs as ordered, Perform/monitor CIWA, COWS, AIMS and Fall Risk screenings as ordered, Perform wound care treatments as ordered.  Evaluation of Outcomes: Progressing   LCSW Treatment Plan for Primary Diagnosis: Major depressive disorder, recurrent, severe with psychotic symptoms ([place]) Long Term Goal(s): Safe transition to appropriate next level of care at discharge, Engage patient in therapeutic group addressing interpersonal concerns.  Short Term Goals: Engage patient in aftercare planning with referrals and resources, Increase social support, Increase ability to appropriately verbalize feelings, Increase emotional regulation, Facilitate acceptance of mental health diagnosis and concerns, Facilitate patient progression through stages of change regarding substance use diagnoses and concerns, Identify triggers associated with mental health/substance abuse issues, and Increase  skills for wellness and recovery  Therapeutic Interventions: Assess for all discharge needs, 1 to 1 time with Social worker, Explore available resources and support systems, Assess for adequacy in community support network, Educate family and significant other(s) on suicide prevention, Complete Psychosocial Assessment, Interpersonal group therapy.  Evaluation of Outcomes: Progressing   Progress in Treatment: Attending groups: Yes. Participating in groups: Yes. Taking medication as prescribed: Yes. Toleration medication: Yes. Family/Significant other contact made: No, will contact:  wife, [name] Patient understands diagnosis: Yes. Discussing patient identified problems/goals with staff: Yes. Medical problems stabilized or resolved: Yes. Denies suicidal/homicidal ideation: Yes. Issues/concerns per patient self-inventory: No. Other: none reported  New problem(s) identified: No, Describe:  none reported   New Short Term/Long Term Goal(s):  medication stabilization, elimination of SI thoughts, development of comprehensive mental wellness plan.    Patient Goals:  Pt states, " I would like to get to the root of problems and understand better about why I did what I did."  Discharge Plan or Barriers: Patient recently admitted. CSW will continue to follow and assess for appropriate referrals and possible discharge planning.    Reason for Continuation of Hospitalization: Anxiety Depression Medication stabilization Suicidal ideation  Estimated Length of Stay: 3-5 days  Last 3 [place] Suicide Severity Risk Score: [place] Admission (Current) from 07/31/2021 in [facility] from 06/06/2021 in [facility] from 05/29/2021 in [facility]  C-SSRS RISK CATEGORY High Risk No Risk No Risk       Last PHQ 2/9 Scores:    03/24/2015   10:27 AM  Depression screen PHQ 2/9  Decreased  Interest 1  Down, Depressed, Hopeless 1  PHQ - 2 Score 2  Altered sleeping 3  Tired, decreased energy 3  Change in appetite 1  Feeling bad or failure about yourself  1  Trouble concentrating 2  Moving slowly or fidgety/restless 0  Suicidal thoughts 0  PHQ-9 Score 12  Difficult doing work/chores Somewhat difficult    Scribe for Treatment Team: Zachery Conch, LCSW 08/01/2021 10:47 AM

## 2021-08-01 NOTE — Progress Notes (Signed)
Patient did not attend morning orientation/goal's group because he as asleep.

## 2021-08-01 NOTE — [facility] Group Notes (Signed)
  Spiritual care group on grief and loss facilitated by chaplain Janne Napoleon, [facility]   Group Goal:   Support / Education around grief and loss   Members engage in facilitated group support and psycho-social education.   Group Description:   Following introductions and group rules, group members engaged in facilitated group dialog and support around topic of loss, with particular support around experiences of loss in their lives. Group Identified types of loss (relationships / self / things) and identified patterns, circumstances, and changes that precipitate losses. Reflected on thoughts / feelings around loss, normalized grief responses, and recognized variety in grief experience. Group noted Worden's four tasks of grief in discussion.   Group drew on Adlerian / Rogerian, narrative, MI,   Patient Progress: Ed attended group.  Verbal participation was limited, but Ed showed engagement in group conversation.  [address], [place] Pager, [phone]

## 2021-08-01 NOTE — Progress Notes (Signed)
1:1 Note 1100   Patient has been in dayroom this morning and also in his room.  Respirations even and unlabored.  No signs/symptoms of pain/distress noted on patient's face/body movements.  Safety maintained with 1:1 per MD orders.

## 2021-08-01 NOTE — Progress Notes (Addendum)
1:1 Note 1:30 pm  Patient stated he ate about 75 % of his lunch.   That he has been to the bathroom  Showered last night.  Feeling better today.  Tremors in his arms/hands have decreased  today.  No signs/symptoms of pain/distress noted on patient's face/body movements.  Safety maintained with 1:1 per MD order.

## 2021-08-01 NOTE — Group Note (Signed)
Occupational Therapy Group Note  Group Topic:Communication  Group Date: 08/01/2021 Start Time: 1400 End Time: 1500 Facilitators: Brantley Stage, OT   Group Description: Group encouraged increased engagement and participation through discussion focused on communication styles. Patients were educated on the different styles of communication including passive, aggressive, assertive, and passive-aggressive communication. Group members shared and reflected on which styles they most often find themselves communicating in and brainstormed strategies on how to transition and practice a more assertive approach. Further discussion explored how to use assertiveness skills and strategies to further advocate and ask questions as it relates to their treatment plan and mental health.   Therapeutic Goal(s): Identify practical strategies to improve communication skills  Identify how to use assertive communication skills to address individual needs and wants   Participation Level: Minimal   Participation Quality: Minimal Cues   Behavior: Calm   Speech/Thought Process: Barely audible   Affect/Mood: Flat   Insight: Fair   Judgement: Fair   Individualization: Pt was active / engaged in their participation of group discussion/activity. New skills were identified  Modes of Intervention: Discussion and Education  Patient Response to Interventions:  Attentive and Engaged   Plan: Continue to engage patient in OT groups 2 - 3x/week.  08/01/2021  Brantley Stage, OT Cornell Barman, OT

## 2021-08-02 DIAGNOSIS — Z87898 Personal history of other specified conditions: Secondary | ICD-10-CM

## 2021-08-02 LAB — GLUCOSE, CAPILLARY
Glucose-Capillary: 255 mg/dL — ABNORMAL HIGH (ref 70–99)
Glucose-Capillary: 102 mg/dL — ABNORMAL HIGH (ref 70–99)
Glucose-Capillary: 361 mg/dL — ABNORMAL HIGH (ref 70–99)
Glucose-Capillary: 147 mg/dL — ABNORMAL HIGH (ref 70–99)

## 2021-08-02 MED ORDER — INSULIN ASPART 100 UNIT/ML IJ SOLN
12.0000 [IU] | Freq: Two times a day (BID) | INTRAMUSCULAR | Status: DC
  Administered 2021-08-02 – 2021-08-04 (×4): 12 [IU] via SUBCUTANEOUS

## 2021-08-02 MED ORDER — PREGABALIN 50 MG PO CAPS
450.0000 mg | ORAL_CAPSULE | Freq: Every day | ORAL | Status: DC
  Administered 2021-08-02 – 2021-08-03 (×2): 450 mg via ORAL
  Filled 2021-08-02 (×2): qty 1

## 2021-08-02 MED ORDER — VITAMIN D (ERGOCALCIFEROL) 1.25 MG (50000 UNIT) PO CAPS
50000.0000 [IU] | ORAL_CAPSULE | ORAL | Status: DC
  Administered 2021-08-03: 50000 [IU] via ORAL
  Filled 2021-08-02 (×2): qty 1

## 2021-08-02 NOTE — [facility] Group Notes (Signed)
Adult Orientation Group Note  Date:  08/02/2021 Time:  10:48 AM  Group Topic/Focus:  Orientation:   The focus of this group is to educate the patient on the purpose and policies of crisis stabilization and provide a format to answer questions about their admission.  The group details unit policies and expectations of patients while admitted.  Participation Level:  Active  Participation Quality:  Appropriate  Affect:  Appropriate  Cognitive:  Appropriate  Insight: Appropriate  Engagement in Group:  Engaged  Modes of Intervention:  Education   Vincent [name] 08/02/2021, 10:48 AM

## 2021-08-02 NOTE — Progress Notes (Addendum)
1:1 Note 8:00 am  Patient ate 90% of breakfast.  Respirations even and unlabored, no signs/symptoms of pain/distress noted on patient's face/body movements.  Patient ambulates with walker which is in his room.  Safety maintained with 1:1.  Patient can now go to dining room for meals per MD order.

## 2021-08-02 NOTE — Progress Notes (Signed)
   08/01/21 2100  Psych Admission Type (Psych Patients Only)  Admission Status Involuntary  Psychosocial Assessment  Patient Complaints Anxiety  Eye Contact Fair  Facial Expression Flat  Affect Appropriate to circumstance  Speech Logical/coherent  Interaction Assertive  Motor Activity Slow;Unsteady  Appearance/Hygiene Unremarkable  Behavior Characteristics Appropriate to situation;Cooperative  Mood Pleasant  Thought Process  Coherency WDL  Content WDL  Delusions None reported or observed  Perception WDL  Hallucination None reported or observed  Judgment Poor  Confusion None  Danger to Self  Current suicidal ideation? Denies  Agreement Not to Harm Self Yes  Description of Agreement verbal  Danger to Others  Danger to Others None reported or observed

## 2021-08-02 NOTE — Progress Notes (Signed)
1:1 Note 1300   Patient's self inventory sheet, patient has fair sleep.  Good appetite, normal energy level, good concentration.  Rated depression 3, anxiety 3, denied hopeless.  Denied withdrawals.  Then checked tremors.  Denied SI.  Physical problems, pain (light).  Physical pain.  Goal is learn about self control, with past drug habits.  Plans to attend group.  Does have discharge questions to discuss with MD. A:  Medications administered per MD orders.  Emotional support and encouragement given patient. R:  Denied SI and HI, contracts for safety.  Denied A/V hallucinations.  Safety maintained with 1:1 per MD orders.

## 2021-08-02 NOTE — Progress Notes (Signed)
1:1 Discontinued 3:00 pm   Patient has been to groups this afternoon, participated and acting appropriately.  Respirations even and unlabored.  No signs/symptoms of pain/distress noted on patient's face/body movements.  Patient denied SI and HI, contracts for safety.  Denied A/V hallucinations.  Safety will be maintained with 15 minute checks.

## 2021-08-02 NOTE — Progress Notes (Signed)
1:1 Note  Pt has been calm the whole shift, attended wrap up group. Pt reported good appetite, and good sleep. Pt slept well the whole night after taking Trazodone 100 mg PO before going to bed. Pt is alert and oriented, denies SI/HI and contacted for safety, staff remain by the bedside for safety, will continue to monitor.

## 2021-08-02 NOTE — Progress Notes (Signed)
Inpatient Diabetes Program Recommendations  AACE/ADA: New Consensus Statement on Inpatient Glycemic Control (2015)  Target Ranges:  Prepandial:   less than 140 mg/dL      Peak postprandial:   less than 180 mg/dL (1-2 hours)      Critically ill patients:  140 - 180 mg/dL   Lab Results  Component Value Date   GLUCAP 255 (H) 08/02/2021   HGBA1C 14.1 (H) 07/28/2021    Review of Glycemic Control  Latest Reference Range & Units 08/01/21 12:04 08/01/21 17:12 08/01/21 21:18 08/02/21 06:07  Glucose-Capillary 70 - 99 mg/dL 76 212 (H) 189 (H) 255 (H)   Diabetes history: DM 2 Outpatient Diabetes medications: Humalog 15 units qam, and hs, metformin 500 mg bid Current orders for Inpatient glycemic control:  Metformin 500 mg bid Novolog 15 units bid  Novolog 0-15 units tid   Inpatient Diabetes Program Recommendations:    Consider reducing Novolog meal coverage to 12 units tid with meals.   Thanks,  Adah Perl, RN, BC-ADM Inpatient Diabetes Coordinator Pager [phone]  (8a-5p)

## 2021-08-02 NOTE — Progress Notes (Signed)
The patient rated his day as a 7.5 out of 10. He was proud of the fact that he no longer needs to use a walker and had a good visit with his wife.

## 2021-08-02 NOTE — Progress Notes (Addendum)
[facility] MD Progress Note  08/02/2021 7:14 PM ESWIN WORRELL  MRN:  [id number] Subjective:   Roc Streett is a 56 yr old male who presented on 5/18 to [facility] via EMS after a Suicide Attempt via OD (had witnessed seizure by EMS), she was admitted to [facility] on 5/21.  PPHx is significant for Bipolar Disorder, Depression, Anxiety, PTSD, and Chronic Pain, and 1 prior hospitalization [facility] 04/2016).   Case was discussed in the multidisciplinary team. MAR was reviewed and patient was compliant with medications.  He received PRN Trazodone last night.   Psychiatric Team made the following recommendations yesterday: -Continue Effexor XR '150mg'$  PO daily for mood and anxiety -Continue Lurasidone '60mg'$  PO with breakfast for depression and mood stability -Continue Lamictal '150mg'$  PO BID -Continue MS contin '60mg'$  BID -Continue Lidoderm patch daily -Continue Lyrica 225 mg daily -Start Amantadine 100 mg daily    On interview today patient reports he slept well last night but did have some tossing and turning.  He reports his appetite is doing good.  He reports no SI, HI, or AVH.  He reports no [place], Ideas of Reference, or other First Rank symptoms.  He reports no issues with his medications.  Discussed his medication regiment and he confirmed his dosages.  Discussed if he thought his medications needed adjustments.  He reports that he thinks his medications are effective.  When asked what caused him to overdose he reports that he felt like a burden to his family and so that was why he did it.  He reports that while being here he has been going to the groups and it has helped inform him of how there is much more support available out there that he thought was available.  When asked what would be different when discharged if there were no medication changes made he reports that his family is more aware of his issues and will be able to be more supportive of him.  Discussed that Social Work would need to discuss the need for  family to manage his medications.  He reported understanding.  He reports no other concerns at present.   Principal Problem: Bipolar II disorder, most recent episode major depressive ([place]) Diagnosis: Principal Problem:   Bipolar II disorder, most recent episode major depressive (HCC) Active Problems:   Chronic pain   Chronic post-traumatic stress disorder (PTSD)   Essential tremor   DM type 2 with diabetic peripheral neuropathy ([place])   Suicide attempt by drug overdose ([place])   History of seizure  Total Time spent with patient:  I personally spent 30 minutes on the unit in direct patient care. The direct patient care time included face-to-face time with the patient, reviewing the patient's chart, communicating with other professionals, and coordinating care. Greater than 50% of this time was spent in counseling or coordinating care with the patient regarding goals of hospitalization, psycho-education, and discharge planning needs.   Past Psychiatric History: Bipolar Disorder, Depression, Anxiety, PTSD, and Chronic Pain, and 1 prior hospitalization [facility] 04/2016).  Past Medical History:  Past Medical History:  Diagnosis Date   Anxiety    Benign essential tremor    Bipolar 2 disorder ([place])    followed by [facility]--- dr s. Adele Schilder   Bladder cancer [facility])    recurrent   CAD (coronary artery disease)    cardiac cath 2003  and 2011 both showed normal coronary arteries w/ preserved lvf;  Non obstructive on CTA Oct 2019.    Chronic pain syndrome  back---- [facility] [place]   Cold extremities    BLE   COPD (chronic obstructive pulmonary disease) ([place])    DDD (degenerative disc disease), lumbar    Diabetic peripheral neuropathy ([place])    Gastroparesis    followed by dr Henrene Pastor   GERD (gastroesophageal reflux disease)    Hiatal hernia    History of bladder cancer urologist-  previously dr Consuella Lose;  now dr gay   papillay TCC (Ta G1)  s/p TURBT and chemo instillation 2014    History of chest pain 12/2017   heart cath normal   History of encephalopathy 05/27/2015   admission w/ acute encephalopathy thought to be secondary to pain meds and COPD   History of gastric ulcer    History of Helicobacter pylori infection    History of kidney stones    History of TIA (transient ischemic attack) 2008  and 10-19-2018    no residual's   History of traumatic head injury 2010   w/ LOC  per pt needed stitches, hit in head with a mower blade   Hyperlipidemia    Hypertension    Hypogonadism male    s/p  bilateral orchiectomy   Hypothyroidism    Insomnia    Mild obstructive sleep apnea    study in epic 12-04-2016, no cpap   PTSD (post-traumatic stress disorder)    chronic   PTSD (post-traumatic stress disorder)    RA (rheumatoid arthritis) ([place])    followed by [facility]   Seizures, transient [facility]) neurologist-  dr Krista Blue--  differential dx complex partial seizure .vs.  mood disorder .vs.  pseudoseizure--  negative EEG's   confusion episodes and staring spells since 11/ 2015   (03-26-2020 per pt wife last seizure 10 /2021)   Transient confusion NEUOROLOGIST-  DR Krista Blue   Episodes since 11/ 2015--  neurologist dx  differential complex partial seizure  .vs. mood disorder . vs. pseudoseizure   Type 2 diabetes mellitus treated with insulin [facility])    endocrinologist--- dr Loanne Drilling---  (03-26-2020 pt does not check blood sugar at home)    Past Surgical History:  Procedure Laterality Date   AMPUTATION Left 04/28/2020   Procedure: LEFT LITTLE FINGER AMPUTATION;  Surgeon: Newt Minion, MD;  Location: [place];  Service: Orthopedics;  Laterality: Left;   CARDIAC CATHETERIZATION  12-27-2001  DR Einar Gip  &  05-26-2009  DR Irish Lack   RESULTS FOR BOTH ARE NORMAL CORONARIES AND PERSERVED LVF/ EF 60%   CARPAL TUNNEL RELEASE Bilateral right 09-16-2003;  left ?   CARPAL TUNNEL RELEASE Left 02/25/2015   Procedure: LEFT CARPAL TUNNEL RELEASE;  Surgeon: Leanora Cover, MD;  Location: [place];  Service: Orthopedics;  Laterality: Left;   CYSTOSCOPY N/A 10/10/2012   Procedure: CYSTOSCOPY CLOT EVACUATION FULGERATION OF BLEEDERS ;  Surgeon: Claybon Jabs, MD;  Location: [facility];  Service: Urology;  Laterality: N/A;   CYSTOSCOPY WITH BIOPSY N/A 11/26/2015   Procedure: CYSTOSCOPY WITH BIOPSY AND FULGURATION;  Surgeon: Kathie Rhodes, MD;  Location: [place];  Service: Urology;  Laterality: N/A;   ESOPHAGOGASTRODUODENOSCOPY  2014   LAPAROSCOPIC CHOLECYSTECTOMY  11-17-2010   ORCHIECTOMY Right 02/21/2016   Procedure: SCROTAL ORCHIECTOMY with TESTICULAR PROSTHESIS IMPLANT;  Surgeon: Kathie Rhodes, MD;  Location: [facility];  Service: Urology;  Laterality: Right;   ORCHIECTOMY Left 09/02/2018   Procedure: ORCHIECTOMY;  Surgeon: Kathie Rhodes, MD;  Location: [facility];  Service: Urology;  Laterality: Left;   ROTATOR CUFF REPAIR Right 12/2004   TRANSURETHRAL RESECTION OF BLADDER TUMOR N/A 08/09/2012   Procedure: TRANSURETHRAL RESECTION OF BLADDER TUMOR (TURBT) WITH GYRUS WITH MITOMYCIN C;  Surgeon: Claybon Jabs, MD;  Location[facility];  Service: Urology;  Laterality: N/A;   TRANSURETHRAL RESECTION OF BLADDER TUMOR N/A 03/29/2020   Procedure: TRANSURETHRAL RESECTION OF BLADDER TUMOR (TURBT) and post-op instillation of gemcitabine;  Surgeon: Janith Lima, MD;  Location: [facility];  Service: Urology;  Laterality: N/A;   TRANSURETHRAL RESECTION OF BLADDER TUMOR WITH GYRUS (TURBT-GYRUS) N/A 02/27/2014   Procedure: TRANSURETHRAL RESECTION OF BLADDER TUMOR WITH GYRUS (TURBT-GYRUS);  Surgeon: Claybon Jabs, MD;  Location: [facility];  Service: Urology;  Laterality: N/A;   Family History:  Family History  Problem Relation Age of Onset   Diabetes Mother    Diabetes Father    Hypertension Father    Heart attack Father 62       died age 88   Alcohol abuse Father     Colon cancer Neg Hx    Esophageal cancer Neg Hx    Stomach cancer Neg Hx    Rectal cancer Neg Hx    Family Psychiatric  History: Denies Social History:  Social History   Substance and Sexual Activity  Alcohol Use No     Social History   Substance and Sexual Activity  Drug Use No    Social History   Socioeconomic History   Marital status: Married    Spouse name: Not on file   Number of children: 3   Years of education: GED   Highest education level: Not on file  Occupational History   Occupation: Engineer, technical sales    Comment: Owner of company  Tobacco Use   Smoking status: Every Day    Packs/day: 1.50    Years: 38.00    Pack years: 57.00    Types: Cigarettes   Smokeless tobacco: Never  Vaping Use   Vaping Use: Never used  Substance and Sexual Activity   Alcohol use: No   Drug use: No   Sexual activity: Not Currently    Partners: Female    Birth control/protection: None  Other Topics Concern   Not on file  Social History Narrative   Lives at home with his wife and children.   Left-handed.   3-4 cups caffeine per day.   Social Determinants of Health   Financial Resource Strain: Not on file  Food Insecurity: Not on file  Transportation Needs: Not on file  Physical Activity: Not on file  Stress: Not on file  Social Connections: Not on file   Additional Social History:                         Sleep: Good  Appetite:  Good  Current Medications: Current Facility-Administered Medications  Medication Dose Route Frequency Provider Last Rate Last Admin   acetaminophen (TYLENOL) tablet 650 mg  650 mg Oral Q6H PRN Massengill, Nathan, MD       amantadine (SYMMETREL) capsule 100 mg  100 mg Oral Daily Hill, Jackie Plum, MD   100 mg at 08/02/21 0737   amLODipine (NORVASC) tablet 5 mg  5 mg Oral Daily Massengill, Nathan, MD   5 mg at 08/02/21 0739   aspirin EC tablet 81 mg  81 mg Oral Daily Massengill, Ovid Curd, MD   81 mg at 08/02/21 0738    clopidogrel (  PLAVIX) tablet 75 mg  75 mg Oral Daily Massengill, Nathan, MD   75 mg at 08/02/21 0739   insulin aspart (novoLOG) injection 0-15 Units  0-15 Units Subcutaneous TID WC Massengill, Ovid Curd, MD   15 Units at 08/02/21 1707   insulin aspart (novoLOG) injection 12 Units  12 Units Subcutaneous BID WC Briant Cedar, MD   12 Units at 08/02/21 1709   lamoTRIgine (LAMICTAL) tablet 150 mg  150 mg Oral BID Massengill, Ovid Curd, MD   150 mg at 08/02/21 1651   lidocaine (LIDODERM) 5 % 1 patch  1 patch Transdermal Daily PRN Massengill, Ovid Curd, MD       Lurasidone HCl TABS 60 mg  60 mg Oral Q breakfast Massengill, Ovid Curd, MD   60 mg at 08/02/21 7893   metFORMIN (GLUCOPHAGE-XR) 24 hr tablet 500 mg  500 mg Oral BID Massengill, Ovid Curd, MD   500 mg at 08/02/21 1651   morphine (MS CONTIN) 12 hr tablet 60 mg  60 mg Oral BID Massengill, Ovid Curd, MD   60 mg at 08/02/21 1654   nicotine (NICODERM CQ - dosed in mg/24 hours) patch 21 mg  21 mg Transdermal Daily Bobbitt, Shalon E, NP   21 mg at 08/02/21 0736   omega-3 acid ethyl esters (LOVAZA) capsule 1 g  1 g Oral BID Maida Sale, MD   1 g at 08/02/21 1651   pantoprazole (PROTONIX) EC tablet 40 mg  40 mg Oral Daily Massengill, Ovid Curd, MD   40 mg at 08/02/21 0737   pregabalin (LYRICA) capsule 225 mg  225 mg Oral q morning Massengill, Ovid Curd, MD   225 mg at 08/02/21 0955   pregabalin (LYRICA) capsule 450 mg  450 mg Oral QHS Briant Cedar, MD       rosuvastatin (CRESTOR) tablet 10 mg  10 mg Oral Daily Massengill, Ovid Curd, MD   10 mg at 08/02/21 0737   tamsulosin (FLOMAX) capsule 0.8 mg  0.8 mg Oral QHS Massengill, Ovid Curd, MD   0.8 mg at 08/01/21 2136   traZODone (DESYREL) tablet 100 mg  100 mg Oral QHS PRN Massengill, Ovid Curd, MD   100 mg at 08/01/21 2135   venlafaxine XR (EFFEXOR-XR) 24 hr capsule 150 mg  150 mg Oral Q breakfast Massengill, Ovid Curd, MD   150 mg at 08/02/21 8101    Lab Results:  Results for orders placed or performed during  the hospital encounter of 07/31/21 (from the past 48 hour(s))  Glucose, capillary     Status: Abnormal   Collection Time: 07/31/21  7:30 PM  Result Value Ref Range   Glucose-Capillary 149 (H) 70 - 99 mg/dL    Comment: Glucose reference range applies only to samples taken after fasting for at least 8 hours.  Glucose, capillary     Status: Abnormal   Collection Time: 08/01/21  5:27 AM  Result Value Ref Range   Glucose-Capillary 208 (H) 70 - 99 mg/dL    Comment: Glucose reference range applies only to samples taken after fasting for at least 8 hours.  Glucose, capillary     Status: None   Collection Time: 08/01/21 12:04 PM  Result Value Ref Range   Glucose-Capillary 76 70 - 99 mg/dL    Comment: Glucose reference range applies only to samples taken after fasting for at least 8 hours.  Glucose, capillary     Status: Abnormal   Collection Time: 08/01/21  5:12 PM  Result Value Ref Range   Glucose-Capillary 212 (H) 70 - 99 mg/dL  Comment: Glucose reference range applies only to samples taken after fasting for at least 8 hours.  Glucose, capillary     Status: Abnormal   Collection Time: 08/01/21  9:18 PM  Result Value Ref Range   Glucose-Capillary 189 (H) 70 - 99 mg/dL    Comment: Glucose reference range applies only to samples taken after fasting for at least 8 hours.  Glucose, capillary     Status: Abnormal   Collection Time: 08/02/21  6:07 AM  Result Value Ref Range   Glucose-Capillary 255 (H) 70 - 99 mg/dL    Comment: Glucose reference range applies only to samples taken after fasting for at least 8 hours.  Glucose, capillary     Status: Abnormal   Collection Time: 08/02/21 12:01 PM  Result Value Ref Range   Glucose-Capillary 102 (H) 70 - 99 mg/dL    Comment: Glucose reference range applies only to samples taken after fasting for at least 8 hours.  Glucose, capillary     Status: Abnormal   Collection Time: 08/02/21  4:59 PM  Result Value Ref Range   Glucose-Capillary 361 (H) 70 -  99 mg/dL    Comment: Glucose reference range applies only to samples taken after fasting for at least 8 hours.    Blood Alcohol level:  Lab Results  Component Value Date   ETH <10 07/28/2021   ETH <10 60/63/0160    Metabolic Disorder Labs: Lab Results  Component Value Date   HGBA1C 14.1 (H) 07/28/2021   MPG 357.97 07/28/2021   MPG 291.96 11/14/2020   No results found for: PROLACTIN Lab Results  Component Value Date   CHOL 240 (H) 11/16/2020   TRIG 325 (H) 11/16/2020   HDL 38 (L) 11/16/2020   CHOLHDL 6.3 11/16/2020   VLDL 65 (H) 11/16/2020   LDLCALC 137 (H) 11/16/2020   LDLCALC 59 06/11/2020    Physical Findings: AIMS:  , ,  ,  ,    CIWA:    COWS:     Musculoskeletal: Strength & Muscle Tone: decreased Gait & Station: unsteady, using walker Patient leans: N/A  Psychiatric Specialty Exam:  Presentation  General Appearance: Appropriate for Environment; Casual  Eye Contact:Good  Speech:Clear and Coherent; Normal Rate  Speech Volume:Normal  Handedness:Right   Mood and Affect  Mood:described as improving - more euthymic  Affect:constricted   Thought Process  Thought Processes:Linear; Coherent  Descriptions of Associations:Intact  Orientation:Full (Time, Place and Person)  Thought Content:Logical No SI, HI, or AVH. No Paranoia, Ideas of Reference, or First Rank symptoms.  Hallucinations:Hallucinations: None  Ideas of Reference:None  Suicidal Thoughts:Suicidal Thoughts: No  Homicidal Thoughts:Homicidal Thoughts: No   Sensorium  Memory:Immediate Fair; Recent Poor; Remote Fair (does not recall overdose event)  Judgment:Fair  Insight:Fair   Executive Functions  Concentration:Good  Attention Span:Good  [place]  Language:Good   Psychomotor Activity  Psychomotor Activity:Psychomotor Activity: Decreased; Tremor; Shuffling Gait   Assets  Assets:Desire for Improvement; Social Support; Housing;  Communication Skills   Sleep  Sleep:Sleep: Good Number of Hours of Sleep: 7.25    Physical Exam Constitutional:      General: He is not in acute distress.    Appearance: Normal appearance. He is normal weight. He is not toxic-appearing.  HENT:     Head: Normocephalic and atraumatic.  Pulmonary:     Effort: Pulmonary effort is normal.  Neurological:     Mental Status: He is alert.   Review of Systems  Respiratory:  Negative for  cough and shortness of breath.   Cardiovascular:  Negative for chest pain.  Gastrointestinal:  Negative for abdominal pain, constipation, diarrhea, nausea and vomiting.  Neurological:  Negative for dizziness, weakness and headaches.  Psychiatric/Behavioral:  Negative for depression, hallucinations and suicidal ideas. The patient is not nervous/anxious.   Blood pressure 125/67, pulse 79, temperature 98.1 F (36.7 C), temperature source Oral, resp. rate 18, height 6' (1.829 m), weight 101.6 kg, SpO2 98 %. Body mass index is 30.38 kg/m.   Treatment Plan Summary: Daily contact with patient to assess and evaluate symptoms and progress in treatment and Medication management  Taggart Prasad is a 56 yr old male who presented on 5/18 to [facility] via EMS after a Suicide Attempt via OD (had witnessed seizure by EMS), he was admitted to [facility] on 5/21.  PPHx is significant for Bipolar Disorder, Depression, Anxiety, PTSD, and Chronic Pain, and 1 prior hospitalization [facility] 04/2016).   Helder reports that he is doing better.  He has physically improved to the point where he is able to get around the unit with his walker so we will stop his 1:1 observation.  He reports that his medications are for the most part effective and he had an acute stressor which led to his suicide attempt prior to admission.  We will attempt to reach out to Dr. Adele Schilder to coordinate potential medication changes. Patient currently declines medication changes and feels stable on present regimen. We will  continue to monitor.   Bipolar II MRE depressed PTSD: -Continue Effexor XR '150mg'$  PO daily for mood and anxiety (need to verify home dose) -Continue Lurasidone '60mg'$  PO with breakfast for depression and mood stability (repeat EKG pending for QTC monitoring) - Continue Lamictal '150mg'$  bid - Continue Trazodone '100mg'$  qhs PRN insomnia  Seizures: -Continue Lamictal '150mg'$  PO BID  Chronic Pain: -Continue MS contin '60mg'$  BID (dose verified in PDMP and last filled 07/24/21) Not presently requiring IR morphine at this time for breakthrough pain -Continue Lidoderm patch daily -Continue Lyrica 225 mg daily and restart home dose of '450mg'$  qhs (verified dose with pharmacy and PDMP)   Diabetes: -Continue SSI -Decrease Insulin Aspart to 12 unit BID per diabetic coordinator recommendations -Continue Metformin 500 mg BID - A1c 14.1  History of Stroke: -Continue Aspirin 81 mg daily -Continue Plavix 75 mg daily  Hyperlipidemia - Repeat Lipid panel pending -Continue Crestor 10 mg daily - Continue Lovaza 1g daily -Verifying if on Fenofibrate  HTN - Continue Norvasc '5mg'$  daily  Nicotine Dependence: -Continue Nicotine Patch 21 mg daily  Tremor -Continue Amantadine 100 mg daily  GERD -Continue Protonix 40 mg daily  Vitamin D deficiency - restart home Vit D 50,000IU weekly   -Continue Flomax 0.8 mg QHS -Continue PRN's: Tylenol, Maalox, Atarax, Milk of Magnesia, Trazodone   Labs on admission: CMP: WNL,  CBC: WNL, Ammonia: <10,   RPR: Neg,  Resp Panel: Neg,  TSH: 4.116,  Vit D: 41.36,  Vit B12: 485,  Acute Hep Panel: Neg EKG (5/18): Right and left arm electrode reversal, interpretation assumes no reversal, Sinus or ectopic atrial rhythm, Right axis deviation Abnormal T, consider  ischemia, lateral leads with Qtc: [place], PGY2 08/02/2021, 7:14 PM

## 2021-08-02 NOTE — Plan of Care (Addendum)
Nurse discussed anxiety, depression and coping skills with patient.  

## 2021-08-02 NOTE — Progress Notes (Signed)
1:1 Note 1000   Patient continues to be with 1:1 per MD orders.  Patient attended morning group.  Ambulates with walker in hallway and room.  Patient tremors have greatly decreased.  Patient can  hold glass of water and medicine without assistance now.   1:1 continues per MD order for safety.

## 2021-08-02 NOTE — Progress Notes (Signed)
1:1 discontinued 1900  Patient is not using his walker tonight.  Wife visited him again, she comes every night.  Stated he is feeling much better.  Hopes to be discharged soon.  Denied SI and HI, contracts for safety.  Denied A/V hallucinations.  Denied pain.  Safety maintained with 15 minute checks.

## 2021-08-02 NOTE — Progress Notes (Signed)
1:1 Note 1440   Patient is sitting in his third group this afternoon.  Patient denied SI and HI, contracts for safety.  Denied A/V hallucinations.  Patient continues to use his walker for ambulation.   Respirations even and unlabored.  No signs/symptoms of pain/distress noted on patient's face/body movements.    Safety maintained with 1:1 per MD orders.

## 2021-08-02 NOTE — Progress Notes (Signed)
1:1 Discontinued 5:00 p.m.   Patient went downstairs for recreation activities this afternoon.  Patients returned to adult unit after recreation.  Patient's CBG was 361.  Novolog total of 27 units given patient per MD orders.  Patient stated he was feeling much better today.  Has not used walker since returning from recreation.  Respirations even and unlabored.  No signs/symptoms of pain/distress noted on patient's face/body movements.  Slight tremors noted on patient's arms, hands.  Patient smiling and talking to peers/staff.  Safety will be maintained with 15 minute checks.

## 2021-08-02 NOTE — Group Note (Signed)
Recreation Therapy Group Note   Group Topic:Animal Assisted Therapy   Group Date: 08/02/2021 Start Time: 1430 End Time: 1510 Facilitators: Victorino Sparrow, LRT,CTRS Location: [address] Dayroom   Animal-Assisted Activity (AAA) Program Checklist/Progress Note Patient Eligibility Criteria Checklist & Daily Group note for Rec Tx Intervention  AAA/T Program Assumption of Risk Form signed by Patient/ or Parent Legal Guardian YES  Patient is free of allergies or severe asthma  YES  Patient reports no fear of animals YES  Patient reports no history of cruelty to animals YES  Patient understands their participation is voluntary YES  Patient washes hands before animal contact YES  Patient washes hands after animal contact YES  Group Description: Patients provided opportunity to interact with trained and credentialed [employer] Therapy dog and the community volunteer/dog handler. Patients practiced appropriate animal interaction and were educated on dog safety outside of the hospital in common community settings. Patients were allowed to use dog toys and other items to practice commands, engage the dog in play, and/or complete routine aspects of animal care.   Education: Contractor, Pensions consultant, Communication & Social Skills    Affect/Mood: Appropriate   Participation Level: Engaged    Clinical Observations/Individualized Feedback:  Pt attended and participated in group session.  Pt engaged with therapy dog team in an appropriate manner.    Plan: Continue to engage patient in RT group sessions 2-3x/week.   Victorino Sparrow, Glennis Brink 08/02/2021 3:27 PM

## 2021-08-03 LAB — LIPID PANEL
Cholesterol: 210 mg/dL — ABNORMAL HIGH (ref 0–200)
HDL: 39 mg/dL — ABNORMAL LOW (ref >40)
LDL Cholesterol: UNDETERMINED mg/dL (ref 0–99)
Total CHOL/HDL Ratio: 5.4 RATIO
Triglycerides: 445 mg/dL — ABNORMAL HIGH (ref <150)
VLDL: UNDETERMINED mg/dL (ref 0–40)

## 2021-08-03 LAB — GLUCOSE, CAPILLARY
Glucose-Capillary: 200 mg/dL — ABNORMAL HIGH (ref 70–99)
Glucose-Capillary: 167 mg/dL — ABNORMAL HIGH (ref 70–99)
Glucose-Capillary: 314 mg/dL — ABNORMAL HIGH (ref 70–99)
Glucose-Capillary: 178 mg/dL — ABNORMAL HIGH (ref 70–99)

## 2021-08-03 LAB — LDL CHOLESTEROL, DIRECT: Direct LDL: 120.6 mg/dL — ABNORMAL HIGH (ref 0–99)

## 2021-08-03 MED ORDER — ONDANSETRON 4 MG PO TBDP: 4.0000 mg | ORAL_TABLET | Freq: Four times a day (QID) | ORAL | Status: DC | PRN

## 2021-08-03 MED ORDER — DICYCLOMINE HCL 20 MG PO TABS: 20.0000 mg | ORAL_TABLET | Freq: Four times a day (QID) | ORAL | Status: DC | PRN

## 2021-08-03 MED ORDER — METHOCARBAMOL 500 MG PO TABS: 500.0000 mg | ORAL_TABLET | Freq: Three times a day (TID) | ORAL | Status: DC | PRN

## 2021-08-03 MED ORDER — MORPHINE SULFATE ER 30 MG PO TBCR
30.0000 mg | EXTENDED_RELEASE_TABLET | Freq: Two times a day (BID) | ORAL | Status: DC
  Administered 2021-08-03 – 2021-08-04 (×2): 30 mg via ORAL
  Filled 2021-08-03 (×2): qty 1

## 2021-08-03 MED ORDER — CLONIDINE HCL 0.1 MG PO TABS: 0.1000 mg | ORAL_TABLET | Freq: Three times a day (TID) | ORAL | Status: DC | PRN

## 2021-08-03 MED ORDER — FENOFIBRATE 160 MG PO TABS
160.0000 mg | ORAL_TABLET | Freq: Every day | ORAL | Status: DC
Start: 2021-08-03 — End: 2021-08-04
  Administered 2021-08-03 – 2021-08-04 (×2): 160 mg via ORAL
  Filled 2021-08-03 (×4): qty 1

## 2021-08-03 NOTE — Progress Notes (Signed)
Pt denies SI/HI/AVH and verbally agrees to approach staff if these become apparent or before harming themselves/others. Rates depression 0/10. Rates anxiety 0/10. Rates pain 0/10. Pt has been in the dayroom for most of the day. Pt does not seem to manage his BG well and pt educated on limiting the types of foods that he eats. Scheduled medications administered to pt, per MD orders. RN provided support and encouragement to pt. Q15 min safety checks implemented and continued. Pt safe on the unit. RN will continue to monitor and intervene as needed.   08/03/21 0738  Psych Admission Type (Psych Patients Only)  Admission Status Involuntary  Psychosocial Assessment  Patient Complaints None  Eye Contact Fair  Facial Expression Animated  Affect Appropriate to circumstance  Speech Logical/coherent  Interaction Assertive  Motor Activity Slow  Appearance/Hygiene Unremarkable  Behavior Characteristics Cooperative;Appropriate to situation;Calm  Mood Pleasant  Thought Process  Coherency WDL  Content WDL  Delusions None reported or observed  Perception WDL  Hallucination None reported or observed  Judgment Limited  Confusion None  Danger to Self  Current suicidal ideation? Denies  Danger to Others  Danger to Others None reported or observed

## 2021-08-03 NOTE — B[facility] Counselor (Addendum)
CSW spoke with Mrs. Stander who states that she is afraid of her husband and is not ready for him to be out of the hospital and back at the home they share.  CSW spoke with Mrs. Ferrie about a 50B and she states that she does not want to apply for that at this time.  Mrs. Shams agrees to help her husband with his medications after discharge.  She states that she can hold on to his medications and bring them by the home daily.  She states that she can also get family members or friends to help her with taking the medication to him daily.  CSW will inform the providers of this information.   Update:  Mrs. Balles contacted the CSW to ask if she could be held responsible if the Pt hoards his medications and makes another suicide attempt.  Mrs. Wales states that she does not want to be held responsible for the Pt making another attempt with his medications and is concerned about being the person that assists him with his medications.  CSW will inform the providers.

## 2021-08-03 NOTE — Plan of Care (Incomplete)
Patient reports that he was able to call his pain clinic. He says that they are discontinuing his MS Contin prescription and providing a prescription to manage withdrawals.   Explained that our recommendation would be for him to stay longer than his planned discharge date (tomorrow) in order to taper him off of his current pain medication. He says that he does not desire to stay longer and wants to just get home to get started on his new medication regimen. He states that the longer he stays away from home, the more stressed he is about his relationships with his wife and his family deteriorating. Explained the difficulty in abruptly stopping opioids and the dangers of withdrawal, but he continued to insist that he just wants to get home to start his new meds and would use the withdrawal prescription and his gabapentin to manage any withdrawal symptoms.

## 2021-08-03 NOTE — [facility] Group Notes (Signed)
Adult Psychoeducational Group Note  Date:  08/03/2021 Time:  11:04 AM  Group Topic/Focus:  Healthy Communication:   The focus of this group is to discuss communication, barriers to communication, as well as healthy ways to communicate with others.  Participation Level:  Minimal  Participation Quality:  Appropriate  Affect:  Appropriate  Cognitive:  Appropriate  Insight: Appropriate  Engagement in Group:  Engaged  Modes of Intervention:  Discussion  Additional Comments:    Dub Mikes 08/03/2021, 11:04 AM

## 2021-08-03 NOTE — [facility] Group Notes (Signed)
PT attended NA group and was appropriate and attentive.PT was active and read aloud.

## 2021-08-03 NOTE — [facility] Group Notes (Signed)
The focus of this group is to help patients establish daily goals to achieve during treatment and discuss how the patient can incorporate goal setting into their daily lives to aide in recovery.  Pt stated that his goal  was to "Get more educated on drugs and the consequences of them"

## 2021-08-03 NOTE — Progress Notes (Signed)
   08/02/21 2300  Psych Admission Type (Psych Patients Only)  Admission Status Involuntary  Psychosocial Assessment  Patient Complaints None  Eye Contact Fair  Facial Expression Anxious  Affect Appropriate to circumstance  Speech Logical/coherent  Interaction Assertive  Motor Activity Slow  Appearance/Hygiene Improved  Behavior Characteristics Appropriate to situation;Cooperative  Mood Pleasant  Thought Process  Coherency WDL  Content WDL  Delusions None reported or observed  Perception WDL  Hallucination None reported or observed  Judgment Poor  Confusion None  Danger to Self  Current suicidal ideation? Denies  Agreement Not to Harm Self Yes  Description of Agreement verbal  Danger to Others  Danger to Others None reported or observed

## 2021-08-03 NOTE — Group Note (Signed)
Recreation Therapy Group Note   Group Topic:Team Building  Group Date: 08/03/2021 Start Time: 0930 End Time: 1000 Facilitators: Victorino Sparrow, LRT,CTRS Location: [address] Dayroom   Goal Area(s) Addresses:  Patient will effectively work with peer towards shared goal.  Patient will identify skills used to make activity successful.  Patient will identify how skills used during activity can be applied to reach post d/c goals.    Group Description: [employer]. In teams of 5-6, patients were given 25 small craft pipe cleaners. Using the materials provided, patients were instructed to compete again the opposing team(s) to build the tallest free-standing structure from floor level. The activity was timed; difficulty increased by Probation officer as Pharmacist, hospital continued.  Systematically resources were removed with additional directions for example, placing one arm behind their back, working in silence, and shape stipulations. LRT facilitated post-activity discussion reviewing team processes and necessary communication skills involved in completion. Patients were encouraged to reflect how the skills utilized, or not utilized, in this activity can be incorporated to positively impact support systems post discharge.   Affect/Mood: Appropriate   Participation Level: Moderate   Participation Quality: Independent   Behavior: Appropriate   Speech/Thought Process: Focused   Insight: Moderate   Judgement: Moderate   Modes of Intervention: Team-building   Patient Response to Interventions:  Attentive and Engaged   Education Outcome:  Acknowledges education and In group clarification offered    Clinical Observations/Individualized Feedback: Pt started out helping team construct their tower.  Pt was appropriate and attentive.  Pt eventually started observing as peers completed the tower.    Plan: Continue to engage patient in RT group sessions 2-3x/week.   Victorino Sparrow,  LRT,CTRS 08/03/2021 10:59 AM

## 2021-08-03 NOTE — Progress Notes (Signed)
Occupational Therapy Treatment Patient Details Name: Vincent [name] MRN: [id number] DOB: May 16, 1965 Today's Date: 08/02/2021   History of present illness  ROM deficits in R UE / OT to address weakness and ROM deficits in order to capture and translate this into function for ADL independence.    OT comments  Pt was found ambulating in hallway w/ 1:1 sitter and was received by OT and escorted to room for OT session / Tx. Pt demonstrates impressive gains in ROM where OT teaches pt he is able to address and Tx ROM failures / deficits in flexion w/ SL in bed on L side. At EOB sitting is severely limited in ROM at R UE into shoulder flexion / while in SL he achieves nearly full ROM and is able to reach overhead on a parallel plane x positioning and partially eliminating gravity. Over time pt will be able to ultimately achieve strengthening and ROM that will translate into functional ROM w/ continued OT Tx.    Recommendations for follow up therapy are one component of a multi-disciplinary discharge planning process, led by the attending physician.  Recommendations may be updated based on patient status, additional functional criteria and insurance authorization.    Follow Up Recommendations  No OT follow up    Assistance Recommended at Discharge PRN  Patient can return home with the following  A little help with walking and/or transfers;A little help with bathing/dressing/bathroom   Equipment Recommendations  Tub/shower bench    Recommendations for Other Services      Precautions / Restrictions   FALL // 1:1 sitter until otherwise directed x provider / MD      Mobility Bed Mobility Overal bed mobility: Independent             General bed mobility comments: additinoal time required to t/fer from supine to EOB    Transfers Overall transfer level: Modified independent Equipment used: Rolling walker (2 wheels)               General transfer comment: MOD I w/o LOB or sway      Balance Overall balance assessment: Modified Independent                                         ADL either performed or assessed with clinical judgement   ADL Overall ADL's : Needs assistance/impaired (for LB ADLs / will cont to address w/ AE for these ADL areas)                                            Extremity/Trunk Assessment Upper Extremity Assessment Upper Extremity Assessment: Generalized weakness;RUE deficits/detail RUE Deficits / Details: R UE shoulder flexion deficits d/t RC limitations / achieves nearly full ROM in side lying       Cervical / Trunk Assessment Cervical / Trunk Assessment: Normal    Vision       Perception     Praxis      Cognition Arousal/Alertness: Awake/alert Behavior During Therapy: WFL for tasks assessed/performed Overall Cognitive Status: Within Functional Limits for tasks assessed  Exercises General Exercises - Upper Extremity Shoulder Flexion: AROM, Sidelying, Right, 10 reps (x3 sets) Shoulder Exercises Shoulder Flexion: AROM, Right, Sidelying, 10 reps    Shoulder Instructions       General Comments RW for ambulation w/o LOB or sway    Pertinent Vitals/ Pain       Pain Assessment Breathing: normal Negative Vocalization: none Facial Expression: smiling or inexpressive Body Language: relaxed  Home Living                                          Prior Functioning/Environment              Frequency  Min 2X/week        Progress Toward Goals  OT Goals(current goals can now be found in the care plan section)  Progress towards OT goals: Progressing toward goals  Acute Rehab OT Goals Patient Stated Goal: no changes / making progress OT Goal Formulation: With patient Time For Goal Achievement: 08/16/21 Potential to Achieve Goals: [place] Frequency remains appropriate    Co-evaluation                  AM-PAC OT "6 Clicks" Daily Activity     Outcome Measure   Help from another person eating meals?: None Help from another person taking care of personal grooming?: None Help from another person toileting, which includes using toliet, bedpan, or urinal?: None Help from another person bathing (including washing, rinsing, drying)?: None Help from another person to put on and taking off regular upper body clothing?: None Help from another person to put on and taking off regular lower body clothing?: A Little 6 Click Score: 23    End of Session Equipment Utilized During Treatment: Rolling walker (2 wheels)  OT Visit Diagnosis: Muscle weakness (generalized) (M62.81);Feeding difficulties (R63.3);Ataxia, unspecified (R27.0)   Activity Tolerance Patient tolerated treatment well   Patient Left in bed;with nursing/sitter in room   Nurse Communication Mobility status       08/02/2021 Time: 1240-1307  OT Time Calculation (min): 27 min  Charges: OT General Charges $OT Visit: 1 Visit OT Treatments $Therapeutic Activity: 8-22 mins  Cornell Barman, OT   BRODRICK CURRAN 08/02/2021, 1307 PM (Late entry)

## 2021-08-03 NOTE — Progress Notes (Addendum)
[facility] MD Progress Note  08/03/2021 3:28 PM Vincent [name]  MRN:  [id number] Subjective:    Vincent [name] is a 56 yr old male who presented on 5/18 to [facility] via EMS after a Suicide Attempt via OD (had witnessed seizure by EMS), she was admitted to [facility] on 5/21.  PPHx is significant for Bipolar Disorder, Depression, Anxiety, PTSD, and Chronic Pain, and 1 prior hospitalization [facility] 04/2016).     Case was discussed in the multidisciplinary team. MAR was reviewed and patient was compliant with medications.  He received PRN Trazodone last night.     Psychiatric Team made the following recommendations yesterday: -Continue Effexor XR '150mg'$  PO daily for mood and anxiety -Continue Lurasidone '60mg'$  PO with breakfast for depression and mood stability -Continue Lamictal '150mg'$  PO BID -Continue MS contin '60mg'$  BID -Continue Lidoderm patch daily -Continue Lyrica 225 mg daily and restart home dose of '450mg'$  qhs (verified dose with pharmacy and PDMP) -Continue Amantadine 100 mg daily       On interview today patient reports he slept well last night.  He reports his appetite is doing good.  He reports no SI, HI, or AVH.  He reports no [place], Ideas of Reference, or other First Rank symptoms.  He reports no issues with his medications.  Asked him if he had been taking his Effexor twice a day and he reported that he had.  Discussed with him that we would recommend taking it only once a day as he has been taking for the last few days and he was agreeable with this.  Asked him if he had been taking fenofibrate and he reported he had been taking this.  Discussed that we would restart this today.  Discussed again with him what led to his suicide attempt.  He reports that he just got withdrawn because he was not communicating with his family and felt like he was a burden to them.  Asked how he was feeling now and since we had made medication changes what has changed that would prevent him from doing something similar to  this again.  He reports he is not feeling suicidal now.  He reports that what is changed is his family has shown more love and support for him and will be more responsive to his needs.  Discussed with him about starting therapy once he discharged given that he reports issues with communication and he was agreeable to this.  He reports that he also plans to go to NA once he discharges.  He reports that he did have an issue with cocaine abuse years ago and feels like it will be a supportive environment for him.  He reports that he has had some diarrhea.  When asked if he had taken any Imodium for it he said he had not.  Discussed with him that it is available as needed for diarrhea however cautioned him that since he is on chronic opiate medications which can cause constipation he should avoid excessive use of the Imodium and sure he drinks plenty of fluid.  He reports no other concerns at present.   Update: Patient called his pain clinic to report his overdose and he was told that he would no longer be seen at the clinic.  Discussed with him that due to this we would need to taper him off his MS Contin.  Discussed we would begin decreasing his medicines and start as needed medications for withdrawal.  Discussed with him when he would be able  to discharge will depend on how long the taper off his opiate will take.  Discussed with him the need to talk with his wife tonight when she comes to visit him about whether she is willing to hold his medications and deliver them daily as she is not going to be at the house when he is discharged but will instead be staying with her parents.  He reports understanding and has no other concerns at present.   Principal Problem: Bipolar II disorder, most recent episode major depressive ([place]) Diagnosis: Principal Problem:   Bipolar II disorder, most recent episode major depressive (HCC) Active Problems:   Chronic pain   Chronic post-traumatic stress disorder (PTSD)    Essential tremor   DM type 2 with diabetic peripheral neuropathy ([place])   Suicide attempt by drug overdose ([place])   History of seizure  Total Time spent with patient: 30 minutes  Past Psychiatric History: Bipolar Disorder, Depression, Anxiety, PTSD, and Chronic Pain, and 1 prior hospitalization [facility] 04/2016).  Past Medical History:  Past Medical History:  Diagnosis Date   Anxiety    Benign essential tremor    Bipolar 2 disorder ([place])    followed by [facility]--- dr s. Adele Schilder   Bladder cancer [facility])    recurrent   CAD (coronary artery disease)    cardiac cath 2003  and 2011 both showed normal coronary arteries w/ preserved lvf;  Non obstructive on CTA Oct 2019.    Chronic pain syndrome    back---- [facility] [place]   Cold extremities    BLE   COPD (chronic obstructive pulmonary disease) ([place])    DDD (degenerative disc disease), lumbar    Diabetic peripheral neuropathy ([place])    Gastroparesis    followed by dr Henrene Pastor   GERD (gastroesophageal reflux disease)    Hiatal hernia    History of bladder cancer urologist-  previously dr Consuella Lose;  now dr gay   papillay TCC (Ta G1)  s/p TURBT and chemo instillation 2014   History of chest pain 12/2017   heart cath normal   History of encephalopathy 05/27/2015   admission w/ acute encephalopathy thought to be secondary to pain meds and COPD   History of gastric ulcer    History of Helicobacter pylori infection    History of kidney stones    History of TIA (transient ischemic attack) 2008  and 10-19-2018    no residual's   History of traumatic head injury 2010   w/ LOC  per pt needed stitches, hit in head with a mower blade   Hyperlipidemia    Hypertension    Hypogonadism male    s/p  bilateral orchiectomy   Hypothyroidism    Insomnia    Mild obstructive sleep apnea    study in epic 12-04-2016, no cpap   PTSD (post-traumatic stress disorder)    chronic   PTSD (post-traumatic stress disorder)    RA (rheumatoid  arthritis) ([place])    followed by [facility]   Seizures, transient [facility]) neurologist-  dr Krista Blue--  differential dx complex partial seizure .vs.  mood disorder .vs.  pseudoseizure--  negative EEG's   confusion episodes and staring spells since 11/ 2015   (03-26-2020 per pt wife last seizure 10 /2021)   Transient confusion NEUOROLOGIST-  DR Krista Blue   Episodes since 11/ 2015--  neurologist dx  differential complex partial seizure  .vs. mood disorder . vs. pseudoseizure   Type 2 diabetes mellitus treated with insulin ([place])  endocrinologist--- dr Loanne Drilling---  (03-26-2020 pt does not check blood sugar at home)    Past Surgical History:  Procedure Laterality Date   AMPUTATION Left 04/28/2020   Procedure: LEFT LITTLE FINGER AMPUTATION;  Surgeon: Newt Minion, MD;  Location: [place];  Service: Orthopedics;  Laterality: Left;   CARDIAC CATHETERIZATION  12-27-2001  DR Einar Gip  &  05-26-2009  DR Irish Lack   RESULTS FOR BOTH ARE NORMAL CORONARIES AND PERSERVED LVF/ EF 60%   CARPAL TUNNEL RELEASE Bilateral right 09-16-2003;  left ?   CARPAL TUNNEL RELEASE Left 02/25/2015   Procedure: LEFT CARPAL TUNNEL RELEASE;  Surgeon: Leanora Cover, MD;  Location: [place];  Service: Orthopedics;  Laterality: Left;   CYSTOSCOPY N/A 10/10/2012   Procedure: CYSTOSCOPY CLOT EVACUATION FULGERATION OF BLEEDERS ;  Surgeon: Claybon Jabs, MD;  Location: [facility];  Service: Urology;  Laterality: N/A;   CYSTOSCOPY WITH BIOPSY N/A 11/26/2015   Procedure: CYSTOSCOPY WITH BIOPSY AND FULGURATION;  Surgeon: Kathie Rhodes, MD;  Location: [place];  Service: Urology;  Laterality: N/A;   ESOPHAGOGASTRODUODENOSCOPY  2014   LAPAROSCOPIC CHOLECYSTECTOMY  11-17-2010   ORCHIECTOMY Right 02/21/2016   Procedure: SCROTAL ORCHIECTOMY with TESTICULAR PROSTHESIS IMPLANT;  Surgeon: Kathie Rhodes, MD;  Location: [facility];  Service: Urology;  Laterality: Right;   ORCHIECTOMY Left  09/02/2018   Procedure: ORCHIECTOMY;  Surgeon: Kathie Rhodes, MD;  Location: [facility];  Service: Urology;  Laterality: Left;   ROTATOR CUFF REPAIR Right 12/2004   TRANSURETHRAL RESECTION OF BLADDER TUMOR N/A 08/09/2012   Procedure: TRANSURETHRAL RESECTION OF BLADDER TUMOR (TURBT) WITH GYRUS WITH MITOMYCIN C;  Surgeon: Claybon Jabs, MD;  Location: [facility];  Service: Urology;  Laterality: N/A;   TRANSURETHRAL RESECTION OF BLADDER TUMOR N/A 03/29/2020   Procedure: TRANSURETHRAL RESECTION OF BLADDER TUMOR (TURBT) and post-op instillation of gemcitabine;  Surgeon: Janith Lima, MD;  Location: [facility];  Service: Urology;  Laterality: N/A;   TRANSURETHRAL RESECTION OF BLADDER TUMOR WITH GYRUS (TURBT-GYRUS) N/A 02/27/2014   Procedure: TRANSURETHRAL RESECTION OF BLADDER TUMOR WITH GYRUS (TURBT-GYRUS);  Surgeon: Claybon Jabs, MD;  Location: [facility];  Service: Urology;  Laterality: N/A;   Family History:  Family History  Problem Relation Age of Onset   Diabetes Mother    Diabetes Father    Hypertension Father    Heart attack Father 52       died age 28   Alcohol abuse Father    Colon cancer Neg Hx    Esophageal cancer Neg Hx    Stomach cancer Neg Hx    Rectal cancer Neg Hx    Family Psychiatric  History: Denies Social History:  Social History   Substance and Sexual Activity  Alcohol Use No     Social History   Substance and Sexual Activity  Drug Use No    Social History   Socioeconomic History   Marital status: Married    Spouse name: Not on file   Number of children: 3   Years of education: GED   Highest education level: Not on file  Occupational History   Occupation: Engineer, technical sales    Comment: Owner of company  Tobacco Use   Smoking status: Every Day    Packs/day: 1.50    Years: 38.00    Pack years: 57.00    Types: Cigarettes   Smokeless tobacco: Never  Vaping Use   Vaping  Use:  Never used  Substance and Sexual Activity   Alcohol use: No   Drug use: No   Sexual activity: Not Currently    Partners: Female    Birth control/protection: None  Other Topics Concern   Not on file  Social History Narrative   Lives at home with his wife and children.   Left-handed.   3-4 cups caffeine per day.   Social Determinants of Health   Financial Resource Strain: Not on file  Food Insecurity: Not on file  Transportation Needs: Not on file  Physical Activity: Not on file  Stress: Not on file  Social Connections: Not on file   Additional Social History:                         Sleep: Good  Appetite:  Good  Current Medications: Current Facility-Administered Medications  Medication Dose Route Frequency Provider Last Rate Last Admin   acetaminophen (TYLENOL) tablet 650 mg  650 mg Oral Q6H PRN Massengill, Nathan, MD       amantadine (SYMMETREL) capsule 100 mg  100 mg Oral Daily Hill, Jackie Plum, MD   100 mg at 08/03/21 0738   amLODipine (NORVASC) tablet 5 mg  5 mg Oral Daily Massengill, Nathan, MD   5 mg at 08/03/21 1093   aspirin EC tablet 81 mg  81 mg Oral Daily Massengill, Ovid Curd, MD   81 mg at 08/03/21 2355   clopidogrel (PLAVIX) tablet 75 mg  75 mg Oral Daily Massengill, Ovid Curd, MD   75 mg at 08/03/21 7322   dicyclomine (BENTYL) tablet 20 mg  20 mg Oral Q6H PRN Briant Cedar, MD       fenofibrate tablet 160 mg  160 mg Oral Daily Briant Cedar, MD   160 mg at 08/03/21 1149   insulin aspart (novoLOG) injection 0-15 Units  0-15 Units Subcutaneous TID WC Massengill, Ovid Curd, MD   3 Units at 08/03/21 1149   insulin aspart (novoLOG) injection 12 Units  12 Units Subcutaneous BID WC Briant Cedar, MD   12 Units at 08/03/21 0739   lamoTRIgine (LAMICTAL) tablet 150 mg  150 mg Oral BID Massengill, Ovid Curd, MD   150 mg at 08/03/21 0738   lidocaine (LIDODERM) 5 % 1 patch  1 patch Transdermal Daily PRN Massengill, Ovid Curd, MD       Lurasidone  HCl TABS 60 mg  60 mg Oral Q breakfast Massengill, Ovid Curd, MD   60 mg at 08/03/21 0254   metFORMIN (GLUCOPHAGE-XR) 24 hr tablet 500 mg  500 mg Oral BID Massengill, Ovid Curd, MD   500 mg at 08/03/21 2706   methocarbamol (ROBAXIN) tablet 500 mg  500 mg Oral Q8H PRN Briant Cedar, MD       morphine (MS CONTIN) 12 hr tablet 30 mg  30 mg Oral BID Briant Cedar, MD       nicotine (NICODERM CQ - dosed in mg/24 hours) patch 21 mg  21 mg Transdermal Daily Bobbitt, Shalon E, NP   21 mg at 08/03/21 0743   omega-3 acid ethyl esters (LOVAZA) capsule 1 g  1 g Oral BID Maida Sale, MD   1 g at 08/03/21 0738   ondansetron (ZOFRAN-ODT) disintegrating tablet 4 mg  4 mg Oral Q6H PRN Briant Cedar, MD       pantoprazole (PROTONIX) EC tablet 40 mg  40 mg Oral Daily Massengill, Ovid Curd, MD   40 mg at 08/03/21 [phone]  pregabalin (LYRICA) capsule 225 mg  225 mg Oral q morning Massengill, Ovid Curd, MD   225 mg at 08/03/21 0942   pregabalin (LYRICA) capsule 450 mg  450 mg Oral QHS Briant Cedar, MD   450 mg at 08/02/21 2112   rosuvastatin (CRESTOR) tablet 10 mg  10 mg Oral Daily Massengill, Ovid Curd, MD   10 mg at 08/03/21 4268   tamsulosin (FLOMAX) capsule 0.8 mg  0.8 mg Oral QHS Massengill, Ovid Curd, MD   0.8 mg at 08/02/21 2112   traZODone (DESYREL) tablet 100 mg  100 mg Oral QHS PRN Massengill, Ovid Curd, MD   100 mg at 08/02/21 2112   venlafaxine XR (EFFEXOR-XR) 24 hr capsule 150 mg  150 mg Oral Q breakfast Massengill, Ovid Curd, MD   150 mg at 08/03/21 3419   Vitamin D (Ergocalciferol) (DRISDOL) capsule 50,000 Units  50,000 Units Oral Q7 days Harlow Asa, MD   50,000 Units at 08/03/21 1149    Lab Results:  Results for orders placed or performed during the hospital encounter of 07/31/21 (from the past 48 hour(s))  Glucose, capillary     Status: Abnormal   Collection Time: 08/01/21  5:12 PM  Result Value Ref Range   Glucose-Capillary 212 (H) 70 - 99 mg/dL    Comment: Glucose reference  range applies only to samples taken after fasting for at least 8 hours.  Glucose, capillary     Status: Abnormal   Collection Time: 08/01/21  9:18 PM  Result Value Ref Range   Glucose-Capillary 189 (H) 70 - 99 mg/dL    Comment: Glucose reference range applies only to samples taken after fasting for at least 8 hours.  Glucose, capillary     Status: Abnormal   Collection Time: 08/02/21  6:07 AM  Result Value Ref Range   Glucose-Capillary 255 (H) 70 - 99 mg/dL    Comment: Glucose reference range applies only to samples taken after fasting for at least 8 hours.  Glucose, capillary     Status: Abnormal   Collection Time: 08/02/21 12:01 PM  Result Value Ref Range   Glucose-Capillary 102 (H) 70 - 99 mg/dL    Comment: Glucose reference range applies only to samples taken after fasting for at least 8 hours.  Glucose, capillary     Status: Abnormal   Collection Time: 08/02/21  4:59 PM  Result Value Ref Range   Glucose-Capillary 361 (H) 70 - 99 mg/dL    Comment: Glucose reference range applies only to samples taken after fasting for at least 8 hours.  Glucose, capillary     Status: Abnormal   Collection Time: 08/02/21  8:34 PM  Result Value Ref Range   Glucose-Capillary 147 (H) 70 - 99 mg/dL    Comment: Glucose reference range applies only to samples taken after fasting for at least 8 hours.  Glucose, capillary     Status: Abnormal   Collection Time: 08/03/21  6:36 AM  Result Value Ref Range   Glucose-Capillary 200 (H) 70 - 99 mg/dL    Comment: Glucose reference range applies only to samples taken after fasting for at least 8 hours.  Lipid panel     Status: Abnormal   Collection Time: 08/03/21  6:46 AM  Result Value Ref Range   Cholesterol 210 (H) 0 - 200 mg/dL   Triglycerides 445 (H) <150 mg/dL   HDL 39 (L) >40 mg/dL   Total CHOL/HDL Ratio 5.4 RATIO   VLDL UNABLE TO CALCULATE IF TRIGLYCERIDE OVER 400 mg/dL 0 -  40 mg/dL   LDL Cholesterol UNABLE TO CALCULATE IF TRIGLYCERIDE OVER 400 mg/dL  0 - 99 mg/dL    Comment:        Total Cholesterol/HDL:CHD Risk Coronary Heart Disease Risk Table                     Men   Women  1/2 Average Risk   3.4   3.3  Average Risk       5.0   4.4  2 X Average Risk   9.6   7.1  3 X Average Risk  23.4   11.0        Use the calculated Patient Ratio above and the CHD Risk Table to determine the patient's CHD Risk.        ATP III CLASSIFICATION (LDL):  <100     mg/dL   Optimal  100-129  mg/dL   Near or Above                    Optimal  130-159  mg/dL   Borderline  160-189  mg/dL   High  >190     mg/dL   Very High Performed at [facility] [address], [place], [place] [address]   Glucose, capillary     Status: Abnormal   Collection Time: 08/03/21 11:41 AM  Result Value Ref Range   Glucose-Capillary 167 (H) 70 - 99 mg/dL    Comment: Glucose reference range applies only to samples taken after fasting for at least 8 hours.   Comment 1 Notify RN    Comment 2 Document in Chart     Blood Alcohol level:  Lab Results  Component Value Date   ETH <10 07/28/2021   ETH <10 62/69/4854    Metabolic Disorder Labs: Lab Results  Component Value Date   HGBA1C 14.1 (H) 07/28/2021   MPG 357.97 07/28/2021   MPG 291.96 11/14/2020   No results found for: PROLACTIN Lab Results  Component Value Date   CHOL 210 (H) 08/03/2021   TRIG 445 (H) 08/03/2021   HDL 39 (L) 08/03/2021   CHOLHDL 5.4 08/03/2021   VLDL UNABLE TO CALCULATE IF TRIGLYCERIDE OVER 400 mg/dL 08/03/2021   LDLCALC UNABLE TO CALCULATE IF TRIGLYCERIDE OVER 400 mg/dL 08/03/2021   LDLCALC 137 (H) 11/16/2020    Physical Findings: AIMS: Facial and Oral Movements Muscles of Facial Expression: None, normal Lips and Perioral Area: None, normal Jaw: None, normal Tongue: None, normal,Extremity Movements Upper (arms, wrists, hands, fingers): None, normal Lower (legs, knees, ankles, toes): None, normal, Trunk Movements Neck, shoulders, hips: None, normal, Overall  Severity Severity of abnormal movements (highest score from questions above): None, normal Incapacitation due to abnormal movements: None, normal Patient's awareness of abnormal movements (rate only patient's report): No Awareness, Dental Status Current problems with teeth and/or dentures?: No Does patient usually wear dentures?: No  CIWA:    COWS:     Musculoskeletal: Strength & Muscle Tone: within normal limits Gait & Station: normal Patient leans: N/A  Psychiatric Specialty Exam:  Presentation  General Appearance: Appropriate for Environment; Casual  Eye Contact:Good  Speech:Clear and Coherent; Normal Rate  Speech Volume:Normal  Handedness:Right   Mood and Affect  Mood:Described as improved - more euthymic appearing  Affect:calm, polite, less constricted   Thought Process  Thought Processes:Coherent; Linear; Goal Directed  Descriptions of Associations:Intact  Orientation:Full (Time, Place and Person)  Thought Content:Logical No SI, HI, or AVH. No Paranoia, Ideas of Reference,  or First Rank symptoms.  Hallucinations:Hallucinations: None  Ideas of Reference:None  Suicidal Thoughts:Suicidal Thoughts: No  Homicidal Thoughts:Homicidal Thoughts: No   Sensorium  Memory:Immediate Fair; Recent [place]  Insight:Fair   Executive Functions  Concentration:Good  Attention Span:Good  [place]   Psychomotor Activity  Psychomotor Activity:ambulating without walker, no tremor noted   Assets  Assets:Desire for Improvement; Social Support; Housing; Communication Skills   Sleep  Sleep:Sleep: Fair Number of Hours of Sleep: 5.5    Physical Exam: Physical Exam Constitutional:      General: He is not in acute distress.    Appearance: Normal appearance. He is normal weight. He is not ill-appearing or toxic-appearing.  HENT:     Head: Normocephalic and atraumatic.  Pulmonary:     Effort:  Pulmonary effort is normal.  Musculoskeletal:        General: Normal range of motion.  Neurological:     General: No focal deficit present.     Mental Status: He is alert.   Review of Systems  Respiratory:  Negative for cough and shortness of breath.   Cardiovascular:  Negative for chest pain.  Gastrointestinal:  Positive for diarrhea. Negative for abdominal pain, constipation, nausea and vomiting.  Neurological:  Negative for dizziness, weakness and headaches.  Blood pressure 125/67, pulse 79, temperature 98.1 F (36.7 C), temperature source Oral, resp. rate 18, height 6' (1.829 m), weight 101.6 kg, SpO2 98 %. Body mass index is 30.38 kg/m.   Treatment Plan Summary: Daily contact with patient to assess and evaluate symptoms and progress in treatment and Medication management  Vincent [name] is a 56 yr old male who presented on 5/18 to [facility] via EMS after a Suicide Attempt via OD (had witnessed seizure by EMS), he was admitted to [facility] on 5/21.  PPHx is significant for Bipolar Disorder, Depression, Anxiety, PTSD, and Chronic Pain, and 1 prior hospitalization [facility] 04/2016).    Ammaar has made significant physical improvements as today he does not need his walker to get around the unit today.  His mood is improved and stable.  We will restart is home Fenofibrate.  Given his elevated Lipid Panel we will recommend he follow up with his PCP.  He reports that prior to admission he was taking Effexor twice a day but we discussed with him we recommend daily dosing and he was agreeable.  Since he has been discharged from his Pain Clinic we will begin to taper him off his MS Contin.  We will start COWS and PRN medications.  To reduce the amount of MS Contin he would need we will plan to discharge him on Friday unless his wife is willing to control his MS Contin.  We will continue to monitor.    Bipolar II MRE depressed PTSD: -Continue Effexor XR '150mg'$  PO daily for mood and anxiety  -Continue  Lurasidone '60mg'$  PO with breakfast for depression and mood stability -Continue Lamictal '150mg'$  bid -Continue Trazodone '100mg'$  qhs PRN insomnia    Seizures: -Continue Lamictal '150mg'$  PO BID    Chronic Pain: -Decrease MS contin to 30 mg BID with plan to continue to taper off- 2 days at 30 mg BID, 2 days at 15 mg BID, then 2 days at 15 mg daily -Continue Lidoderm patch daily -Continue Lyrica 225 mg daily and restart home dose of '450mg'$  qhs (verified dose with pharmacy and PDMP)   Withdrawal: -Start COWS -Continue Imodium 2-4 mg PRN diarrhea -Start Robaxin 500 mg q8  PRN muscle spasms -Start Zofran-ODT 4 mg q6 PRN nausea -Start Bentyl 20 mg q6 PRN spasms - PRN clonidine ordered    Diabetes: -Continue SSI -Continue Insulin Aspart 12 unit BID  -Continue Metformin 500 mg BID   History of Stroke: -Continue Aspirin 81 mg daily -Continue Plavix 75 mg daily    Hyperlipidemia: -Will need follow up with PCP -Continue Crestor 10 mg daily -Continue Lovaza 1g daily -Restart home Fenofibrate 160 mg daily    HTN: -Continue Norvasc '5mg'$  daily   Nicotine Dependence: -Continue Nicotine Patch 21 mg daily   Tremor -Continue Amantadine 100 mg daily   GERD -Continue Protonix 40 mg daily    Vitamin D Deficiency: -Continue Vit D 50,000IU weekly     -Continue Flomax 0.8 mg QHS -Continue PRN's: Tylenol, Maalox, Atarax, Milk of Magnesia, Trazodone     Labs on admission: CMP: WNL,  CBC: WNL, Ammonia: <10,   RPR: Neg,  Resp Panel: Neg,  TSH: 4.116,  Vit D: 41.36,  Vit B12: 485,  Acute Hep Panel: Neg EKG (5/18): Right and left arm electrode reversal, interpretation assumes no reversal, Sinus or ectopic atrial rhythm, Right axis deviation Abnormal T, consider  ischemia, lateral leads with Qtc: 405 5/24: Lipid Panel: Chol: 210,  Trig: 445, HDL: 39,  VLDL and LDL cannot calculate,  EKG: NSR with Qtc:415   Briant Cedar, MD 08/03/2021, 3:28 PM

## 2021-08-03 NOTE — Group Note (Signed)

## 2021-08-04 DIAGNOSIS — F3181 Bipolar II disorder: Secondary | ICD-10-CM

## 2021-08-04 LAB — GLUCOSE, CAPILLARY
Glucose-Capillary: 243 mg/dL — ABNORMAL HIGH (ref 70–99)
Glucose-Capillary: 172 mg/dL — ABNORMAL HIGH (ref 70–99)

## 2021-08-04 MED ORDER — NICOTINE 21 MG/24HR TD PT24: 21.0000 mg | MEDICATED_PATCH | Freq: Every day | TRANSDERMAL | 0 refills | Status: DC

## 2021-08-04 MED ORDER — AMANTADINE HCL 100 MG PO CAPS: 100.0000 mg | ORAL_CAPSULE | Freq: Every day | ORAL | 0 refills | Status: DC

## 2021-08-04 MED ORDER — LOPERAMIDE HCL 2 MG PO CAPS
2.0000 mg | ORAL_CAPSULE | ORAL | Status: DC | PRN
  Administered 2021-08-04: 2 mg via ORAL
  Filled 2021-08-04: qty 1

## 2021-08-04 MED ORDER — ROSUVASTATIN CALCIUM 10 MG PO TABS: 10.0000 mg | ORAL_TABLET | Freq: Every day | ORAL | 0 refills | Status: DC

## 2021-08-04 MED ORDER — MORPHINE SULFATE ER 15 MG PO TBCR
15.0000 mg | EXTENDED_RELEASE_TABLET | Freq: Two times a day (BID) | ORAL | 0 refills | Status: DC
Start: 2021-08-04 — End: 2021-08-04

## 2021-08-04 MED ORDER — VENLAFAXINE HCL ER 150 MG PO CP24: 150.0000 mg | ORAL_CAPSULE | Freq: Every day | ORAL | 0 refills | Status: DC

## 2021-08-04 MED ORDER — MORPHINE SULFATE ER 15 MG PO TBCR: 15.0000 mg | EXTENDED_RELEASE_TABLET | Freq: Two times a day (BID) | ORAL | 0 refills | Status: AC

## 2021-08-04 MED ORDER — OMEGA-3-ACID ETHYL ESTERS 1 G PO CAPS: 1.0000 g | ORAL_CAPSULE | Freq: Two times a day (BID) | ORAL | Status: DC

## 2021-08-04 NOTE — Group Note (Signed)
Date:  08/04/2021 Time:  10:11 AM  Group Topic/Focus:  Orientation:   The focus of this group is to educate the patient on the purpose and policies of crisis stabilization and provide a format to answer questions about their admission.  The group details unit policies and expectations of patients while admitted.    Participation Level:  Active  Participation Quality:  Appropriate  Affect:  Appropriate  Cognitive:  Appropriate  Insight: Appropriate  Engagement in Group:  Engaged  Modes of Intervention:  Discussion  Additional Comments:    Garvin Fila 08/04/2021, 10:11 AM

## 2021-08-04 NOTE — [facility] Counselor (Signed)
CSW spoke with Mrs. Ellen Mayol who agrees to manage one of her husbands medications after being discharged from the hospital.  She agrees to drop off one of her husbands medications for each of the days that the medication is prescribed for.  CSW will inform the providers of this information.

## 2021-08-04 NOTE — Progress Notes (Signed)
   08/03/21 2300  Psych Admission Type (Psych Patients Only)  Admission Status Involuntary  Psychosocial Assessment  Patient Complaints None  Eye Contact Fair  Facial Expression Animated  Affect Appropriate to circumstance  Speech Logical/coherent  Interaction Assertive  Motor Activity Slow  Appearance/Hygiene Unremarkable  Behavior Characteristics Appropriate to situation;Cooperative  Mood Pleasant  Thought Process  Coherency WDL  Content WDL  Delusions None reported or observed  Perception WDL  Hallucination None reported or observed  Judgment Limited  Confusion None  Danger to Self  Current suicidal ideation? Denies  Agreement Not to Harm Self Yes  Description of Agreement verbal  Danger to Others  Danger to Others None reported or observed

## 2021-08-04 NOTE — Discharge Summary (Signed)
Suicide Risk Assessment  Discharge Assessment    [facility] Discharge Suicide Risk Assessment   Principal Problem: Bipolar II disorder, most recent episode major depressive ([place]) Discharge Diagnoses: Principal Problem:   Bipolar II disorder, most recent episode major depressive ([place]) Active Problems:   Chronic pain   Chronic post-traumatic stress disorder (PTSD)   Essential tremor   DM type 2 with diabetic peripheral neuropathy ([place])   Suicide attempt by drug overdose ([place])   History of seizure  During the patient's hospitalization, patient had extensive initial psychiatric evaluation, and follow-up psychiatric evaluations every day.  Psychiatric diagnoses provided upon initial assessment: Bipolar Disorder, most recent episode MDD  Patient's psychiatric medications were adjusted on admission: He was restarted on his home medications.  During the hospitalization, other adjustments were made to the patient's psychiatric medication regimen: His Effexor was changed to once daily as he had been taking it twice a day.,  As he was dismissed from his pain clinic we started a taper off his MS Contin.   Gradually, patient started adjusting to milieu.   Patient's care was discussed during the interdisciplinary team meeting every day during the hospitalization.  The patient is not having side effects to prescribed psychiatric medication.  The patient reports their target psychiatric symptoms of SI responded well to the psychiatric medications, and the patient reports overall benefit other psychiatric hospitalization. Supportive psychotherapy was provided to the patient. The patient also participated in regular group therapy while admitted.   Labs were reviewed with the patient, and abnormal results were discussed with the patient.  The patient denied having suicidal thoughts more than 48 hours prior to discharge.  Patient denies having homicidal thoughts.  Patient denies having auditory  hallucinations.  Patient denies any visual hallucinations.  Patient denies having paranoid thoughts.  The patient is able to verbalize their individual safety plan to this provider.  It is recommended to the patient to continue psychiatric medications as prescribed, after discharge from the hospital.    It is recommended to the patient to follow up with your outpatient psychiatric provider and PCP.  Discussed with the patient, the impact of alcohol, drugs, tobacco have been there overall psychiatric and medical wellbeing, and total abstinence from substance use was recommended the patient.  Total Time spent with patient: 20 minutes  Musculoskeletal: Strength & Muscle Tone: within normal limits Gait & Station: normal Patient leans: N/A  Psychiatric Specialty Exam  Presentation  General Appearance: Appropriate for Environment; Casual  Eye Contact:Good  Speech:Clear and Coherent; Normal Rate  Speech Volume:Normal  Handedness:Right   Mood and Affect  Mood:-- ("Ok")  Duration of Depression Symptoms: No data recorded Affect:Congruent; Appropriate   Thought Process  Thought Processes:Coherent; Goal Directed; Linear  Descriptions of Associations:Intact  Orientation:Full (Time, Place and Person)  Thought Content:Logical No SI, HI, or AVH. No Paranoia, Ideas of Reference, or First Rank symptoms.  History of Schizophrenia/Schizoaffective disorder:No data recorded Duration of Psychotic Symptoms:No data recorded Hallucinations:Hallucinations: None  Ideas of Reference:None  Suicidal Thoughts:Suicidal Thoughts: No  Homicidal Thoughts:Homicidal Thoughts: No   Sensorium  Memory:Immediate Fair; Recent Fair  Judgment:Fair  Insight:Fair   Executive Functions  Concentration:Good  Attention Span:Good  [place] of Knowledge:Good  Language:Good   Psychomotor Activity  Psychomotor Activity:Psychomotor Activity: Normal   Assets  Assets:Desire for  Improvement; Data processing manager; Housing; Communication Skills   Sleep  Sleep:Sleep: Good Number of Hours of Sleep: 6.5   Physical Exam: Physical Exam Vitals and nursing note reviewed.  Constitutional:  General: He is not in acute distress.    Appearance: Normal appearance. He is normal weight. He is not ill-appearing or toxic-appearing.  HENT:     Head: Normocephalic and atraumatic.  Pulmonary:     Effort: Pulmonary effort is normal.  Musculoskeletal:        General: Normal range of motion.  Neurological:     General: No focal deficit present.     Mental Status: He is alert.   Review of Systems  Respiratory:  Negative for cough and shortness of breath.   Cardiovascular:  Negative for chest pain.  Gastrointestinal:  Positive for diarrhea. Negative for abdominal pain, constipation, nausea and vomiting.  Neurological:  Negative for dizziness, weakness and headaches.  Psychiatric/Behavioral:  Negative for depression, hallucinations and suicidal ideas. The patient is not nervous/anxious.   Blood pressure (!) 155/62, pulse 84, temperature 98 F (36.7 C), resp. rate 18, height 6' (1.829 m), weight 101.6 kg, SpO2 100 %. Body mass index is 30.38 kg/m.    08/04/21 0941  Facial and Oral Movements  Muscles of Facial Expression 0  Lips and Perioral Area 0  Jaw 0  Tongue 0  Extremity Movements  Upper (arms, wrists, hands, fingers) 0  Lower (legs, knees, ankles, toes) 0  Trunk Movements  Neck, shoulders, hips 0  Overall Severity  Severity of abnormal movements (highest score from questions above) 0  Incapacitation due to abnormal movements 0  Patient's awareness of abnormal movements (rate only patient's report) 0  Dental Status  Current problems with teeth and/or dentures? No  Does patient usually wear dentures? No  AIMS Total Score  AIMS Total Score 0    No Cogwheeling or Rigidity Present.  Mental Status Per Nursing Assessment::   On Admission:  Suicidal ideation  indicated by patient, Suicidal ideation indicated by others, Suicide plan, Self-harm thoughts, Belief that plan would result in death, Intention to act on suicide plan  Demographic Factors:  Male, Caucasian, and Unemployed  Loss Factors: Decline in physical health  Historical Factors: Impulsivity  Risk Reduction Factors:   Sense of responsibility to family, Positive social support, and Positive therapeutic relationship  Continued Clinical Symptoms:  Chronic Pain Previous Psychiatric Diagnoses and Treatments Medical Diagnoses and Treatments/Surgeries Tappering off MS Contin  Cognitive Features That Contribute To Risk:  Closed-mindedness    Suicide Risk:  Mild:  No current SI, however, he has made an attempt which lead to his admission.  There are no identifiable plans, no associated intent, mild dysphoria and related symptoms, good self-control (both objective and subjective assessment), few other risk factors, and identifiable protective factors, including available and accessible social support.   Follow-up Information     BEHAVIORAL HEALTH PARTIAL HOSPITALIZATION PROGRAM Follow up on 08/09/2021.   Specialty: Behavioral Health Why: ou are scheduled for an assessment for the PHP on Tuesday, 08/09/21 at 10:00 am. This appointment will last approximately one hour and will be virtual via Webex. PHP is virtual group therapy that runs Mon-Fri from 9am-1pm. Please download the [employer] app prior to the appointment. If you need to cancel or reschedule, please call [phone] Contact information: [facility] Follow up on 08/31/2021.   Specialty: Behavioral Health Why: You have an appointment with Dr. Adele Schilder for medication management services on 08/31/21 at 3:40 pm.  This appointment will be Virtual. Contact information: [place]  [place] [phone]                Plan Of Care/Follow-up recommendations:  Activity: as tolerated  Diet: heart healthy  Other: -Follow-up with your outpatient psychiatric provider -instructions on appointment date, time, and address (location) are provided to you in discharge paperwork.  -Take your psychiatric medications as prescribed at discharge - instructions are provided to you in the discharge paperwork  -Follow-up with outpatient primary care doctor and other specialists -for management of chronic medical disease, including: Diabetes- A1c of 14.1. Chronic Pain.  Elevated Lipid Panel. You will need routine monitoring of your- weight, A1c, Lipid Panel, CMP, CBC, and EKG because of your antipsychotic.   -Testing: Follow-up with outpatient provider for abnormal lab results: Diabetes- A1c of 14.1.  Elevated Lipid Panel.   -Recommend abstinence from alcohol, tobacco, and other illicit drug use at discharge.   -If your psychiatric symptoms recur, worsen, or if you have side effects to your psychiatric medications, call your outpatient psychiatric provider, [phone] or go to the nearest emergency department.  -If suicidal thoughts recur, call your outpatient psychiatric provider, [phone] or go to the nearest emergency department.   Briant Cedar, MD 08/04/2021, 12:08 PM

## 2021-08-04 NOTE — Progress Notes (Signed)
Discharge note: Survey completed. RN met with pt and reviewed pt's discharge instructions. Pt verbalized understanding of discharge instructions and pt did not have any questions. RN reviewed and provided pt with a copy of SRA, AVS and Transition Record. RN returned pt's belongings to pt.  Prescriptions were given to pt. Pt denied SI/HI/AVH and voiced no concerns. Pt was appreciative of the care pt received at [facility]. Patient discharged to the lobby without incident.   08/04/21 0816  Psych Admission Type (Psych Patients Only)  Admission Status Involuntary  Psychosocial Assessment  Patient Complaints None  Eye Contact Fair  Facial Expression Animated  Affect Appropriate to circumstance  Speech Logical/coherent  Interaction Assertive  Motor Activity Slow  Appearance/Hygiene Unremarkable  Behavior Characteristics Cooperative;Appropriate to situation;Calm  Mood Pleasant  Thought Process  Coherency WDL  Content WDL  Hallucination None reported or observed  Judgment Limited  Confusion None  Danger to Self  Current suicidal ideation? Denies  Danger to Others  Danger to Others None reported or observed

## 2021-08-04 NOTE — Progress Notes (Signed)
  [facility] Adult Case Management Discharge Plan :  Will you be returning to the same living situation after discharge:  Yes,  Home  At discharge, do you have transportation home?: Yes,  Family  Do you have the ability to pay for your medications: Yes,  Private Insurance/[employer]  Release of information consent forms completed and in the chart;  Patient's signature needed at discharge.  Patient to Follow up at:  Follow-up Information     BEHAVIORAL HEALTH PARTIAL HOSPITALIZATION PROGRAM Follow up on 08/09/2021.   Specialty: Behavioral Health Why: ou are scheduled for an assessment for the PHP on Tuesday, 08/09/21 at 10:00 am. This appointment will last approximately one hour and will be virtual via Webex. PHP is virtual group therapy that runs Mon-Fri from 9am-1pm. Please download the [employer] app prior to the appointment. If you need to cancel or reschedule, please call [phone] Contact information: [facility] Follow up on 08/31/2021.   Specialty: Behavioral Health Why: You have an appointment with Dr. Adele Schilder for medication management services on 08/31/21 at 3:40 pm.  This appointment will be Virtual. Contact information: [place] [phone]                Next level of care provider has access to [place] and Suicide Prevention discussed: Yes,  with patient and wife      Has patient been referred to the Quitline?: Patient refused referral  Patient has been referred for addiction treatment: [place], [place] 08/04/2021, 9:26 AM
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
## 2021-08-09 ENCOUNTER — Telehealth (HOSPITAL_COMMUNITY): Payer: Self-pay | Admitting: Licensed Clinical Social Worker

## 2021-08-09 ENCOUNTER — Other Ambulatory Visit (HOSPITAL_COMMUNITY)
Payer: No Typology Code available for payment source | Attending: Psychiatry | Admitting: Licensed Clinical Social Worker

## 2021-08-09 NOTE — Progress Notes (Signed)
Cln signed on Webex at 10:00 am, sent email link for virtual CCA per schedule, and remained online for session until 10:15 am. Pt failed to sign on. Cln attempted to call pt and left VM.

## 2021-08-15 ENCOUNTER — Other Ambulatory Visit: Payer: Self-pay | Admitting: Neurology

## 2021-08-15 NOTE — Telephone Encounter (Signed)
Rx refilled.

## 2021-08-16 ENCOUNTER — Other Ambulatory Visit: Payer: Self-pay | Admitting: Neurology

## 2021-08-16 DIAGNOSIS — F3181 Bipolar II disorder: Secondary | ICD-10-CM

## 2021-08-18 NOTE — Telephone Encounter (Signed)
Rx refilled.

## 2021-08-19 ENCOUNTER — Encounter: Payer: No Typology Code available for payment source | Admitting: Physical Medicine and Rehabilitation

## 2021-08-23 ENCOUNTER — Telehealth: Payer: Self-pay | Admitting: Neurology

## 2021-08-23 NOTE — Telephone Encounter (Signed)
R/s appt for 8/17 - sarah out

## 2021-08-26 ENCOUNTER — Encounter: Payer: Medicare HMO | Admitting: Physical Medicine and Rehabilitation

## 2021-08-31 ENCOUNTER — Telehealth (HOSPITAL_BASED_OUTPATIENT_CLINIC_OR_DEPARTMENT_OTHER): Payer: No Typology Code available for payment source | Admitting: Psychiatry

## 2021-08-31 ENCOUNTER — Encounter (HOSPITAL_COMMUNITY): Payer: Self-pay | Admitting: Psychiatry

## 2021-08-31 VITALS — Wt 200.0 lb

## 2021-08-31 DIAGNOSIS — G3184 Mild cognitive impairment, so stated: Secondary | ICD-10-CM

## 2021-08-31 DIAGNOSIS — R251 Tremor, unspecified: Secondary | ICD-10-CM | POA: Diagnosis not present

## 2021-08-31 DIAGNOSIS — F4312 Post-traumatic stress disorder, chronic: Secondary | ICD-10-CM

## 2021-08-31 DIAGNOSIS — F3181 Bipolar II disorder: Secondary | ICD-10-CM

## 2021-08-31 MED ORDER — LURASIDONE HCL 60 MG PO TABS: 60.0000 mg | ORAL_TABLET | Freq: Every day | ORAL | 1 refills | Status: DC

## 2021-08-31 MED ORDER — TRAZODONE HCL 100 MG PO TABS: 100.0000 mg | ORAL_TABLET | Freq: Every evening | ORAL | 1 refills | Status: DC | PRN

## 2021-08-31 MED ORDER — VENLAFAXINE HCL ER 150 MG PO CP24: 150.0000 mg | ORAL_CAPSULE | Freq: Every day | ORAL | 1 refills | Status: DC

## 2021-08-31 NOTE — Progress Notes (Signed)
Virtual Visit via Telephone Note  I connected with Brice Kossman Remillard on 08/31/21 at  3:40 PM EDT by telephone and verified that I am speaking with the correct person using two identifiers.  Location: Patient: Home Provider: Home Office   I discussed the limitations, risks, security and privacy concerns of performing an evaluation and management service by telephone and the availability of in person appointments. I also discussed with the patient that there may be a patient responsible charge related to this service. The patient expressed understanding and agreed to proceed.   History of Present Illness: Patient is evaluated by phone session.  He was admitted to the hospital after having overdosed on his medication.  Patient told it was intentional but does not want to kill himself.  He wants to take more so he can be pain-free.  He has a lot of pain lately and now his pain doctor does not want to see him anymore because of the overdose.  His primary care doctor trying to find a new pain specialist.  He is not taking morphine.  He is not sleeping well because of the pain.  In the hospital no new medication added.  He reported 2 weeks ago his wife left him because she could not handle him anymore.  He is trying to get some help from his daughter who is helping him.  Patient has multiple health issues.  Sometimes she tends to forget to take the medication.  He is on Effexor, Latuda, trazodone and also taking Lyrica, Lamictal and Cymbalta from other provider.  Denies any hallucination or any paranoia.  He is trying to lose weight and he had lost more than 15 pounds since he left the hospital.  He admitted some ruminative thoughts and nervousness but denies any active suicidal thoughts.  He is seeing neurology for tremors and headaches.  He is taking amantadine.  His medicine is not managed by his daughter.  Patient supposed to start PHP upon his discharge but patient forgot to contact them.  His primary care  physician at [facility] trying to find a therapist for him.  Patient does not want to change the medication since it is working well.  He admitted to occasionally drink beer but denies drinking every day.  He supposed to start PHP but he did not went there because he was not aware to report.  His last comprehensive metabolic panel shows creatinine 0.96.  Blood sugar 210.  His hemoglobin A1c is 14.1   Past Psychiatric History: Reviewed. H/O one brief hospitalization at [facility] due to suicidal thoughts.  No h/o suicidal attempt.  H/O anger, anxiety, PTSD and heavy substance use.  Claims to be sober for while. Tried Depakote, Zoloft, lithium, Prozac and Xanax. [name] helped but stopped due to polypharmacy.   Recent Results (from the past 2160 hour(s))  Basic metabolic panel     Status: Abnormal   Collection Time: 06/06/21 11:58 AM  Result Value Ref Range   Sodium 132 (L) 135 - 145 mmol/L   Potassium 4.4 3.5 - 5.1 mmol/L   Chloride 97 (L) 98 - 111 mmol/L   CO2 25 22 - 32 mmol/L   Glucose, Bld 443 (H) 70 - 99 mg/dL    Comment: Glucose reference range applies only to samples taken after fasting for at least 8 hours.   BUN 26 (H) 6 - 20 mg/dL   Creatinine, Ser 1.51 (H) 0.61 - 1.24 mg/dL   Calcium 9.5 8.9 - 10.3  mg/dL   GFR, Estimated 55 (L) >60 mL/min    Comment: (NOTE) Calculated using the CKD-EPI Creatinine Equation (2021)    Anion gap 10 5 - 15    Comment: Performed at [facility], [address], [place], [place] [address]  CBC     Status: None   Collection Time: 06/06/21 11:58 AM  Result Value Ref Range   WBC 5.9 4.0 - 10.5 K/uL   RBC 5.27 4.22 - 5.81 MIL/uL   Hemoglobin 14.1 13.0 - 17.0 g/dL   HCT 43.0 39.0 - 52.0 %   MCV 81.6 80.0 - 100.0 fL   MCH 26.8 26.0 - 34.0 pg   MCHC 32.8 30.0 - 36.0 g/dL   RDW 12.5 11.5 - 15.5 %   Platelets 260 150 - 400 K/uL   nRBC 0.0 0.0 - 0.2 %    Comment: Performed at [facility], [address], [place], [place] [address]  Troponin I (High Sensitivity)     Status: None   Collection Time: 06/06/21 11:58 AM  Result Value Ref Range   Troponin I (High Sensitivity) <2 <18 ng/L    Comment: (NOTE) Elevated high sensitivity troponin I (hsTnI) values and significant  changes across serial measurements may suggest ACS but many other  chronic and acute conditions are known to elevate hsTnI results.  Refer to the "Links" section for chest pain algorithms and additional  guidance. Performed at [facility], [address] [place], [place] [address]   Troponin I (High Sensitivity)     Status: None   Collection Time: 06/06/21 12:53 PM  Result Value Ref Range   Troponin I (High Sensitivity) <2 <18 ng/L    Comment: (NOTE) Elevated high sensitivity troponin I (hsTnI) values and significant  changes across serial measurements may suggest ACS but many other  chronic and acute conditions are known to elevate hsTnI results.  Refer to the "Links" section for chest pain algorithms and additional  guidance. Performed at [facility], [address], [place], [place] [address]   CBG monitoring, ED     Status: Abnormal   Collection Time: 06/06/21  2:17 PM  Result Value Ref Range   Glucose-Capillary 130 (H) 70 - 99 mg/dL    Comment: Glucose reference range applies only to samples taken after fasting for at least 8 hours.  CBG monitoring, ED     Status: None   Collection Time: 06/06/21  3:21 PM  Result Value Ref Range   Glucose-Capillary 99 70 - 99 mg/dL    Comment: Glucose reference range applies only to samples taken after fasting for at least 8 hours.   Comment 1 Notify RN    Comment 2 Document in Chart   CBC     Status: None   Collection Time: 07/28/21  6:46 AM  Result Value Ref Range   WBC 6.9 4.0 - 10.5 K/uL   RBC 5.31 4.22 - 5.81 MIL/uL   Hemoglobin 14.5 13.0 - 17.0 g/dL   HCT 44.9 39.0 - 52.0 %   MCV 84.6 80.0 - 100.0 fL   MCH  27.3 26.0 - 34.0 pg   MCHC 32.3 30.0 - 36.0 g/dL   RDW 13.2 11.5 - 15.5 %   Platelets 208 150 - 400 K/uL   nRBC 0.0 0.0 - 0.2 %    Comment: Performed at [facility], [place] [address] [place], [place] [address]  Basic metabolic panel     Status: Abnormal  Collection Time: 07/28/21  6:46 AM  Result Value Ref Range   Sodium 139 135 - 145 mmol/L   Potassium 4.2 3.5 - 5.1 mmol/L   Chloride 105 98 - 111 mmol/L   CO2 26 22 - 32 mmol/L   Glucose, Bld 411 (H) 70 - 99 mg/dL    Comment: Glucose reference range applies only to samples taken after fasting for at least 8 hours.   BUN 20 6 - 20 mg/dL   Creatinine, Ser 1.85 (H) 0.61 - 1.24 mg/dL   Calcium 8.9 8.9 - 10.3 mg/dL   GFR, Estimated 43 (L) >60 mL/min    Comment: (NOTE) Calculated using the CKD-EPI Creatinine Equation (2021)    Anion gap 8 5 - 15    Comment: Performed at [facility] [address] [place], [place] [address]  Ethanol     Status: None   Collection Time: 07/28/21  6:46 AM  Result Value Ref Range   Alcohol, Ethyl (B) <10 <10 mg/dL    Comment: (NOTE) Lowest detectable limit for serum alcohol is 10 mg/dL.  For medical purposes only. Performed at [facility], [place] [address] [place], [place] [address]   Hemoglobin A1c     Status: Abnormal   Collection Time: 07/28/21  6:46 AM  Result Value Ref Range   Hgb A1c MFr Bld 14.1 (H) 4.8 - 5.6 %    Comment: (NOTE) Pre diabetes:          5.7%-6.4%  Diabetes:              >6.4%  Glycemic control for   <7.0% adults with diabetes    Mean Plasma Glucose 357.97 mg/dL    Comment: Performed at [facility] [address] [place], [place] [address]  CK     Status: None   Collection Time: 07/28/21  6:46 AM  Result Value Ref Range   Total CK 371 49 - 397 U/L    Comment: Performed at [facility], [place] [address], [place], [place] [address]  POC CBG, ED     Status: Abnormal   Collection Time: 07/28/21  7:41 AM  Result Value Ref Range    Glucose-Capillary 111 (H) 70 - 99 mg/dL    Comment: Glucose reference range applies only to samples taken after fasting for at least 8 hours.  Lamotrigine level     Status: Abnormal   Collection Time: 07/28/21  7:57 AM  Result Value Ref Range   Lamotrigine Lvl 1.4 (L) 2.0 - 20.0 ug/mL    Comment: (NOTE)                                Detection Limit = 1.0 Performed At: [facility] [place], [place] [id number] Rush Farmer MD [id number]   Resp Panel by RT-PCR (Flu A&B, Covid) Nasopharyngeal Swab     Status: None   Collection Time: 07/28/21  9:39 AM   Specimen: Nasopharyngeal Swab; Nasopharyngeal(NP) swabs in vial transport medium  Result Value Ref Range   SARS Coronavirus 2 by RT PCR NEGATIVE NEGATIVE    Comment: (NOTE) SARS-CoV-2 target nucleic acids are NOT DETECTED.  The SARS-CoV-2 RNA is generally detectable in upper respiratory specimens during the acute phase of infection. The lowest concentration of SARS-CoV-2 viral copies this assay can detect is 138 copies/mL. A negative result does not preclude SARS-Cov-2 infection and should not be  used as the sole basis for treatment or other patient management decisions. A negative result may occur with  improper specimen collection/handling, submission of specimen other than nasopharyngeal swab, presence of viral mutation(s) within the areas targeted by this assay, and inadequate number of viral copies(<138 copies/mL). A negative result must be combined with clinical observations, patient history, and epidemiological information. The expected result is Negative.  Fact Sheet for Patients:  EntrepreneurPulse.com.au  Fact Sheet for Healthcare Providers:  IncredibleEmployment.be  This test is no t yet approved or cleared by the [place] FDA and  has been authorized for detection and/or diagnosis of SARS-CoV-2 by FDA under an Emergency Use Authorization (EUA). This  EUA will remain  in effect (meaning this test can be used) for the duration of the COVID-19 declaration under Section 564(b)(1) of the Act, 21 U.S.C.section 360bbb-3(b)(1), unless the authorization is terminated  or revoked sooner.       Influenza A by PCR NEGATIVE NEGATIVE   Influenza B by PCR NEGATIVE NEGATIVE    Comment: (NOTE) The Xpert Xpress SARS-CoV-2/FLU/RSV plus assay is intended as an aid in the diagnosis of influenza from Nasopharyngeal swab specimens and should not be used as a sole basis for treatment. Nasal washings and aspirates are unacceptable for Xpert Xpress SARS-CoV-2/FLU/RSV testing.  Fact Sheet for Patients: EntrepreneurPulse.com.au  Fact Sheet for Healthcare Providers: IncredibleEmployment.be  This test is not yet approved or cleared by the [place] FDA and has been authorized for detection and/or diagnosis of SARS-CoV-2 by FDA under an Emergency Use Authorization (EUA). This EUA will remain in effect (meaning this test can be used) for the duration of the COVID-19 declaration under Section 564(b)(1) of the Act, 21 U.S.C. section 360bbb-3(b)(1), unless the authorization is terminated or revoked.  Performed at [facility], [place] [address] [place], [place] [address]   Acetaminophen level     Status: Abnormal   Collection Time: 07/28/21 10:50 AM  Result Value Ref Range   Acetaminophen (Tylenol), Serum <10 (L) 10 - 30 ug/mL    Comment: (NOTE) Therapeutic concentrations vary significantly. A range of 10-30 ug/mL  may be an effective concentration for many patients. However, some  are best treated at concentrations outside of this range. Acetaminophen concentrations >150 ug/mL at 4 hours after ingestion  and >50 ug/mL at 12 hours after ingestion are often associated with  toxic reactions.  Performed at [facility], [place] [address] [place], [place] [address]   Salicylate level     Status: Abnormal    Collection Time: 07/28/21 10:50 AM  Result Value Ref Range   Salicylate Lvl <4.4 (L) 7.0 - 30.0 mg/dL    Comment: Performed at [facility] [address] [place], [place] [address]  Ammonia     Status: Abnormal   Collection Time: 07/28/21 10:50 AM  Result Value Ref Range   Ammonia 36 (H) 9 - 35 umol/L    Comment: Performed at [facility], [place] [address] [place], [place] [address]  HIV Antibody (routine testing w rflx)     Status: None   Collection Time: 07/28/21  4:24 PM  Result Value Ref Range   HIV Screen 4th Generation wRfx Non Reactive Non Reactive    Comment: Performed at [facility], [place] [address] [place], [place] [address]  Glucose, capillary     Status: Abnormal   Collection Time: 07/28/21  5:47 PM  Result Value Ref Range   Glucose-Capillary 417 (H)  70 - 99 mg/dL    Comment: Glucose reference range applies only to samples taken after fasting for at least 8 hours.  Blood gas, venous     Status: Abnormal   Collection Time: 07/28/21  6:32 PM  Result Value Ref Range   pH, Ven 7.29 7.25 - 7.43   pCO2, Ven 53 44 - 60 mmHg   pO2, Ven 70 (H) 32 - 45 mmHg   Bicarbonate 25.5 20.0 - 28.0 mmol/L   Acid-base deficit 1.9 0.0 - 2.0 mmol/L   O2 Saturation 94.8 %   Patient temperature 37.1    Collection site Right hand    Drawn by [id number]     Comment: Performed at [facility] [address] [place], [place] [address]  Glucose, capillary     Status: Abnormal   Collection Time: 07/28/21  8:22 PM  Result Value Ref Range   Glucose-Capillary 308 (H) 70 - 99 mg/dL    Comment: Glucose reference range applies only to samples taken after fasting for at least 8 hours.   Comment 1 Notify RN   Glucose, capillary     Status: Abnormal   Collection Time: 07/29/21 12:06 AM  Result Value Ref Range   Glucose-Capillary 206 (H) 70 - 99 mg/dL    Comment: Glucose reference range applies only to samples taken after fasting for at least 8 hours.  Glucose, capillary      Status: Abnormal   Collection Time: 07/29/21  3:09 AM  Result Value Ref Range   Glucose-Capillary 146 (H) 70 - 99 mg/dL    Comment: Glucose reference range applies only to samples taken after fasting for at least 8 hours.  Glucose, capillary     Status: Abnormal   Collection Time: 07/29/21  9:15 AM  Result Value Ref Range   Glucose-Capillary 196 (H) 70 - 99 mg/dL    Comment: Glucose reference range applies only to samples taken after fasting for at least 8 hours.   Comment 1 Notify RN    Comment 2 Document in Chart   Glucose, capillary     Status: Abnormal   Collection Time: 07/29/21 11:47 AM  Result Value Ref Range   Glucose-Capillary 271 (H) 70 - 99 mg/dL    Comment: Glucose reference range applies only to samples taken after fasting for at least 8 hours.  Glucose, capillary     Status: Abnormal   Collection Time: 07/29/21  4:44 PM  Result Value Ref Range   Glucose-Capillary 254 (H) 70 - 99 mg/dL    Comment: Glucose reference range applies only to samples taken after fasting for at least 8 hours.   Comment 1 Notify RN    Comment 2 Document in Chart   Glucose, capillary     Status: Abnormal   Collection Time: 07/29/21 11:26 PM  Result Value Ref Range   Glucose-Capillary 188 (H) 70 - 99 mg/dL    Comment: Glucose reference range applies only to samples taken after fasting for at least 8 hours.  Glucose, capillary     Status: Abnormal   Collection Time: 07/30/21  6:38 AM  Result Value Ref Range   Glucose-Capillary 194 (H) 70 - 99 mg/dL    Comment: Glucose reference range applies only to samples taken after fasting for at least 8 hours.  Resp Panel by RT-PCR (Flu A&B, Covid) Nasopharyngeal Swab     Status: None   Collection Time: 07/30/21 11:03 AM   Specimen: Nasopharyngeal Swab; Nasopharyngeal(NP) swabs in vial transport  medium  Result Value Ref Range   SARS Coronavirus 2 by RT PCR NEGATIVE NEGATIVE    Comment: (NOTE) SARS-CoV-2 target nucleic acids are NOT DETECTED.  The  SARS-CoV-2 RNA is generally detectable in upper respiratory specimens during the acute phase of infection. The lowest concentration of SARS-CoV-2 viral copies this assay can detect is 138 copies/mL. A negative result does not preclude SARS-Cov-2 infection and should not be used as the sole basis for treatment or other patient management decisions. A negative result may occur with  improper specimen collection/handling, submission of specimen other than nasopharyngeal swab, presence of viral mutation(s) within the areas targeted by this assay, and inadequate number of viral copies(<138 copies/mL). A negative result must be combined with clinical observations, patient history, and epidemiological information. The expected result is Negative.  Fact Sheet for Patients:  EntrepreneurPulse.com.au  Fact Sheet for Healthcare Providers:  IncredibleEmployment.be  This test is no t yet approved or cleared by the [place] FDA and  has been authorized for detection and/or diagnosis of SARS-CoV-2 by FDA under an Emergency Use Authorization (EUA). This EUA will remain  in effect (meaning this test can be used) for the duration of the COVID-19 declaration under Section 564(b)(1) of the Act, 21 U.S.C.section 360bbb-3(b)(1), unless the authorization is terminated  or revoked sooner.       Influenza A by PCR NEGATIVE NEGATIVE   Influenza B by PCR NEGATIVE NEGATIVE    Comment: (NOTE) The Xpert Xpress SARS-CoV-2/FLU/RSV plus assay is intended as an aid in the diagnosis of influenza from Nasopharyngeal swab specimens and should not be used as a sole basis for treatment. Nasal washings and aspirates are unacceptable for Xpert Xpress SARS-CoV-2/FLU/RSV testing.  Fact Sheet for Patients: EntrepreneurPulse.com.au  Fact Sheet for Healthcare Providers: IncredibleEmployment.be  This test is not yet approved or cleared by the  [place] FDA and has been authorized for detection and/or diagnosis of SARS-CoV-2 by FDA under an Emergency Use Authorization (EUA). This EUA will remain in effect (meaning this test can be used) for the duration of the COVID-19 declaration under Section 564(b)(1) of the Act, 21 U.S.C. section 360bbb-3(b)(1), unless the authorization is terminated or revoked.  Performed at [facility], [place] [address], [place], [place] [address]   Ammonia     Status: None   Collection Time: 07/30/21 11:13 AM  Result Value Ref Range   Ammonia <10 9 - 35 umol/L    Comment: Performed at [facility], [place] [address] [place], [place] [address]  Glucose, capillary     Status: Abnormal   Collection Time: 07/30/21  3:47 PM  Result Value Ref Range   Glucose-Capillary 255 (H) 70 - 99 mg/dL    Comment: Glucose reference range applies only to samples taken after fasting for at least 8 hours.  Glucose, capillary     Status: Abnormal   Collection Time: 07/30/21  8:52 PM  Result Value Ref Range   Glucose-Capillary 214 (H) 70 - 99 mg/dL    Comment: Glucose reference range applies only to samples taken after fasting for at least 8 hours.  Glucose, capillary     Status: Abnormal   Collection Time: 07/31/21  6:07 AM  Result Value Ref Range   Glucose-Capillary 180 (H) 70 - 99 mg/dL    Comment: Glucose reference range applies only to samples taken after fasting for at least 8 hours.   Comment 1 Notify RN   Glucose, capillary     Status: None  Collection Time: 07/31/21 12:01 PM  Result Value Ref Range   Glucose-Capillary 90 70 - 99 mg/dL    Comment: Glucose reference range applies only to samples taken after fasting for at least 8 hours.  Glucose, capillary     Status: Abnormal   Collection Time: 07/31/21  5:17 PM  Result Value Ref Range   Glucose-Capillary 149 (H) 70 - 99 mg/dL    Comment: Glucose reference range applies only to samples taken after fasting for at least 8 hours.  Comprehensive  metabolic panel     Status: Abnormal   Collection Time: 07/31/21  6:33 PM  Result Value Ref Range   Sodium 138 135 - 145 mmol/L   Potassium 3.6 3.5 - 5.1 mmol/L   Chloride 107 98 - 111 mmol/L   CO2 23 22 - 32 mmol/L   Glucose, Bld 210 (H) 70 - 99 mg/dL    Comment: Glucose reference range applies only to samples taken after fasting for at least 8 hours.   BUN 13 6 - 20 mg/dL   Creatinine, Ser 0.96 0.61 - 1.24 mg/dL   Calcium 8.9 8.9 - 10.3 mg/dL   Total Protein 6.8 6.5 - 8.1 g/dL   Albumin 3.8 3.5 - 5.0 g/dL   AST 16 15 - 41 U/L   ALT 14 0 - 44 U/L   Alkaline Phosphatase 58 38 - 126 U/L   Total Bilirubin 0.6 0.3 - 1.2 mg/dL   GFR, Estimated >60 >60 mL/min    Comment: (NOTE) Calculated using the CKD-EPI Creatinine Equation (2021)    Anion gap 8 5 - 15    Comment: Performed at [facility], [place] [address] [place], [place] [address]  CBC with Differential/Platelet     Status: None   Collection Time: 07/31/21  6:33 PM  Result Value Ref Range   WBC 5.0 4.0 - 10.5 K/uL   RBC 5.29 4.22 - 5.81 MIL/uL   Hemoglobin 14.6 13.0 - 17.0 g/dL   HCT 43.5 39.0 - 52.0 %   MCV 82.2 80.0 - 100.0 fL   MCH 27.6 26.0 - 34.0 pg   MCHC 33.6 30.0 - 36.0 g/dL   RDW 12.8 11.5 - 15.5 %   Platelets 242 150 - 400 K/uL   nRBC 0.0 0.0 - 0.2 %   Neutrophils Relative % 54 %   Neutro Abs 2.7 1.7 - 7.7 K/uL   Lymphocytes Relative 34 %   Lymphs Abs 1.7 0.7 - 4.0 K/uL   Monocytes Relative 7 %   Monocytes Absolute 0.4 0.1 - 1.0 K/uL   Eosinophils Relative 4 %   Eosinophils Absolute 0.2 0.0 - 0.5 K/uL   Basophils Relative 1 %   Basophils Absolute 0.1 0.0 - 0.1 K/uL   Immature Granulocytes 0 %   Abs Immature Granulocytes 0.02 0.00 - 0.07 K/uL    Comment: Performed at [facility], [place] [address] [place], [place] [address]  RPR     Status: None   Collection Time: 07/31/21  6:33 PM  Result Value Ref Range   RPR Ser Ql NON REACTIVE NON REACTIVE    Comment: Performed at  [facility], [place] [address], [place], [place] [address]  TSH     Status: None   Collection Time: 07/31/21  6:33 PM  Result Value Ref Range   TSH 4.116 0.350 - 4.500 uIU/mL    Comment: Performed by a 3rd Generation assay with a functional sensitivity of <=0.01 uIU/mL. Performed  at [facility], [place] [address] [place], [place] [address]   VITAMIN D 25 Hydroxy (Vit-D Deficiency, Fractures)     Status: None   Collection Time: 07/31/21  6:33 PM  Result Value Ref Range   Vit D, 25-Hydroxy 41.36 30 - 100 ng/mL    Comment: (NOTE) Vitamin D deficiency has been defined by the [place] practice guideline as a level of serum 25-OH  vitamin D less than 20 ng/mL (1,2). The Endocrine Society went on to  further define vitamin D insufficiency as a level between 21 and 29  ng/mL (2).  1. IOM (Institute of Medicine). 2010. Dietary reference intakes for  calcium and D. [place]: The [employer]. 2. Holick MF, Binkley [place], Bischoff-Ferrari HA, et al. Evaluation,  treatment, and prevention of vitamin D deficiency: an Endocrine  Society clinical practice guideline, JCEM. 2011 Jul; 96(7): 1911-30.  Performed at [facility], [place] [address], [place], [place] [address]   Vitamin B12     Status: None   Collection Time: 07/31/21  6:33 PM  Result Value Ref Range   Vitamin B-12 485 180 - 914 pg/mL    Comment: (NOTE) This assay is not validated for testing neonatal or myeloproliferative syndrome specimens for Vitamin B12 levels. Performed at [facility], [place] [address] [place], [place] [address]   Hepatitis panel, acute     Status: None   Collection Time: 07/31/21  6:33 PM  Result Value Ref Range   Hepatitis B Surface Ag NON REACTIVE NON REACTIVE   HCV Ab NON REACTIVE NON REACTIVE    Comment: (NOTE) Nonreactive HCV antibody screen is consistent with no HCV infections,  unless recent infection  is suspected or other evidence exists to indicate HCV infection.     Hep A IgM NON REACTIVE NON REACTIVE   Hep B C IgM NON REACTIVE NON REACTIVE    Comment: Performed at [facility], [place] [address], [place], [place] [address]  Glucose, capillary     Status: Abnormal   Collection Time: 07/31/21  7:30 PM  Result Value Ref Range   Glucose-Capillary 149 (H) 70 - 99 mg/dL    Comment: Glucose reference range applies only to samples taken after fasting for at least 8 hours.  Glucose, capillary     Status: Abnormal   Collection Time: 08/01/21  5:27 AM  Result Value Ref Range   Glucose-Capillary 208 (H) 70 - 99 mg/dL    Comment: Glucose reference range applies only to samples taken after fasting for at least 8 hours.  Glucose, capillary     Status: None   Collection Time: 08/01/21 12:04 PM  Result Value Ref Range   Glucose-Capillary 76 70 - 99 mg/dL    Comment: Glucose reference range applies only to samples taken after fasting for at least 8 hours.  Glucose, capillary     Status: Abnormal   Collection Time: 08/01/21  5:12 PM  Result Value Ref Range   Glucose-Capillary 212 (H) 70 - 99 mg/dL    Comment: Glucose reference range applies only to samples taken after fasting for at least 8 hours.  Glucose, capillary     Status: Abnormal   Collection Time: 08/01/21  9:18 PM  Result Value Ref Range   Glucose-Capillary 189 (H) 70 - 99 mg/dL    Comment: Glucose reference range applies only to samples taken after fasting for at least 8 hours.  Glucose,  capillary     Status: Abnormal   Collection Time: 08/02/21  6:07 AM  Result Value Ref Range   Glucose-Capillary 255 (H) 70 - 99 mg/dL    Comment: Glucose reference range applies only to samples taken after fasting for at least 8 hours.  Glucose, capillary     Status: Abnormal   Collection Time: 08/02/21 12:01 PM  Result Value Ref Range   Glucose-Capillary 102 (H) 70 - 99 mg/dL    Comment: Glucose reference range applies only to samples  taken after fasting for at least 8 hours.  Glucose, capillary     Status: Abnormal   Collection Time: 08/02/21  4:59 PM  Result Value Ref Range   Glucose-Capillary 361 (H) 70 - 99 mg/dL    Comment: Glucose reference range applies only to samples taken after fasting for at least 8 hours.  Glucose, capillary     Status: Abnormal   Collection Time: 08/02/21  8:34 PM  Result Value Ref Range   Glucose-Capillary 147 (H) 70 - 99 mg/dL    Comment: Glucose reference range applies only to samples taken after fasting for at least 8 hours.  Glucose, capillary     Status: Abnormal   Collection Time: 08/03/21  6:36 AM  Result Value Ref Range   Glucose-Capillary 200 (H) 70 - 99 mg/dL    Comment: Glucose reference range applies only to samples taken after fasting for at least 8 hours.  Lipid panel     Status: Abnormal   Collection Time: 08/03/21  6:46 AM  Result Value Ref Range   Cholesterol 210 (H) 0 - 200 mg/dL   Triglycerides 445 (H) <150 mg/dL   HDL 39 (L) >40 mg/dL   Total CHOL/HDL Ratio 5.4 RATIO   VLDL UNABLE TO CALCULATE IF TRIGLYCERIDE OVER 400 mg/dL 0 - 40 mg/dL   LDL Cholesterol UNABLE TO CALCULATE IF TRIGLYCERIDE OVER 400 mg/dL 0 - 99 mg/dL    Comment:        Total Cholesterol/HDL:CHD Risk Coronary Heart Disease Risk Table                     Men   Women  1/2 Average Risk   3.4   3.3  Average Risk       5.0   4.4  2 X Average Risk   9.6   7.1  3 X Average Risk  23.4   11.0        Use the calculated Patient Ratio above and the CHD Risk Table to determine the patient's CHD Risk.        ATP III CLASSIFICATION (LDL):  <100     mg/dL   Optimal  100-129  mg/dL   Near or Above                    Optimal  130-159  mg/dL   Borderline  160-189  mg/dL   High  >190     mg/dL   Very High Performed at [facility] [address], [place], [place] [address]   LDL cholesterol, direct     Status: Abnormal   Collection Time: 08/03/21  6:46 AM  Result Value Ref Range    Direct LDL 120.6 (H) 0 - 99 mg/dL    Comment: Performed at [facility] [address], [place], [place] [address]  Glucose, capillary     Status: Abnormal   Collection Time: 08/03/21 11:41 AM  Result  Value Ref Range   Glucose-Capillary 167 (H) 70 - 99 mg/dL    Comment: Glucose reference range applies only to samples taken after fasting for at least 8 hours.   Comment 1 Notify RN    Comment 2 Document in Chart   Glucose, capillary     Status: Abnormal   Collection Time: 08/03/21  5:01 PM  Result Value Ref Range   Glucose-Capillary 314 (H) 70 - 99 mg/dL    Comment: Glucose reference range applies only to samples taken after fasting for at least 8 hours.  Glucose, capillary     Status: Abnormal   Collection Time: 08/03/21  7:52 PM  Result Value Ref Range   Glucose-Capillary 178 (H) 70 - 99 mg/dL    Comment: Glucose reference range applies only to samples taken after fasting for at least 8 hours.  Glucose, capillary     Status: Abnormal   Collection Time: 08/04/21  6:09 AM  Result Value Ref Range   Glucose-Capillary 243 (H) 70 - 99 mg/dL    Comment: Glucose reference range applies only to samples taken after fasting for at least 8 hours.  Glucose, capillary     Status: Abnormal   Collection Time: 08/04/21 12:00 PM  Result Value Ref Range   Glucose-Capillary 172 (H) 70 - 99 mg/dL    Comment: Glucose reference range applies only to samples taken after fasting for at least 8 hours.     Psychiatric Specialty Exam: Physical Exam  Review of Systems  Weight 200 lb (90.7 kg).There is no height or weight on file to calculate BMI.  General Appearance: NA  Eye Contact:  NA  Speech:  Slow  Volume:  Decreased  Mood:  Anxious  Affect:  NA  Thought Process:  Descriptions of Associations: Circumstantial  Orientation:  Full (Time, Place, and Person)  Thought Content:  Rumination  Suicidal Thoughts:  No  Homicidal Thoughts:  No  Memory:  Immediate;   Fair Recent;   Fair Remote;    Fair  Judgement:  Fair  Insight:  Shallow  Psychomotor Activity:  Tremor  Concentration:  Concentration: Fair and Attention Span: Fair  Recall:  [employer] of Knowledge:  Fair  Language:  Fair  Akathisia:  No  Handed:  Right  AIMS (if indicated):     Assets:  Communication Skills Desire for Improvement Housing Social Support  ADL's:  Intact  Cognition:  Impaired,  Mild  Sleep:   fair, having pain      Assessment and Plan: Bipolar disorder type II.  PTSD.  Tremors.  Mild cognitive impairment.  I reviewed blood work results from recent hospitalization.  His medicines are not changed.  His hemoglobin A1c is very high.  He has tremors and now taking amantadine.  I encourage he should consult his primary care doctor for pain management and see his neurologist who is providing Lamictal for seizures, amantadine for tremors and Cymbalta.  We discussed polypharmacy.  Patient would like to keep his medication since he feels they are working well.  He has no more nightmares.  His wife left him but he had a support from his daughter.  His creatinine is improved from the past.  We will continue Latuda 60 mg daily, trazodone 100 mg at bedtime and Effexor 150 mg daily.  We will continue to address his polypharmacy and in the future consider lowering his medication.  His medication pillbox managed by his daughter.  Recommended to call us back if is any  question or any concern.  Follow-up in 6 weeks.  Follow Up Instructions:    I discussed the assessment and treatment plan with the patient. The patient was provided an opportunity to ask questions and all were answered. The patient agreed with the plan and demonstrated an understanding of the instructions.   The patient was advised to call back or seek an in-person evaluation if the symptoms worsen or if the condition fails to improve as anticipated.  Collaboration of Care: Primary Care Provider AEB notes are available in epic to  review  Patient/Guardian was advised Release of Information must be obtained prior to any record release in order to collaborate their care with an outside provider. Patient/Guardian was advised if they have not already done so to contact the registration department to sign all necessary forms in order for [place] to release information regarding their care.   Consent: Patient/Guardian gives verbal consent for treatment and assignment of benefits for services provided during this visit. Patient/Guardian expressed understanding and agreed to proceed.    I provided 35 minutes of non-face-to-face time during this encounter.   Kathlee Nations, MD

## 2021-09-03 ENCOUNTER — Emergency Department (HOSPITAL_COMMUNITY): Payer: No Typology Code available for payment source

## 2021-09-03 ENCOUNTER — Observation Stay (HOSPITAL_COMMUNITY)
Admission: EM | Admit: 2021-09-03 | Discharge: 2021-09-04 | Discharge status: Against Medical Advice | Payer: No Typology Code available for payment source | Attending: Family Medicine | Admitting: Family Medicine

## 2021-09-03 ENCOUNTER — Encounter (HOSPITAL_COMMUNITY): Payer: Self-pay | Admitting: Emergency Medicine

## 2021-09-03 DIAGNOSIS — G4733 Obstructive sleep apnea (adult) (pediatric): Secondary | ICD-10-CM | POA: Insufficient documentation

## 2021-09-03 DIAGNOSIS — J449 Chronic obstructive pulmonary disease, unspecified: Secondary | ICD-10-CM | POA: Insufficient documentation

## 2021-09-03 DIAGNOSIS — E860 Dehydration: Secondary | ICD-10-CM

## 2021-09-03 DIAGNOSIS — D494 Neoplasm of unspecified behavior of bladder: Secondary | ICD-10-CM | POA: Diagnosis present

## 2021-09-03 DIAGNOSIS — Z8673 Personal history of transient ischemic attack (TIA), and cerebral infarction without residual deficits: Secondary | ICD-10-CM | POA: Insufficient documentation

## 2021-09-03 DIAGNOSIS — I959 Hypotension, unspecified: Secondary | ICD-10-CM | POA: Diagnosis not present

## 2021-09-03 DIAGNOSIS — E039 Hypothyroidism, unspecified: Secondary | ICD-10-CM | POA: Diagnosis not present

## 2021-09-03 DIAGNOSIS — N179 Acute kidney failure, unspecified: Secondary | ICD-10-CM | POA: Insufficient documentation

## 2021-09-03 DIAGNOSIS — Z7902 Long term (current) use of antithrombotics/antiplatelets: Secondary | ICD-10-CM | POA: Diagnosis not present

## 2021-09-03 DIAGNOSIS — E861 Hypovolemia: Secondary | ICD-10-CM

## 2021-09-03 DIAGNOSIS — Z8551 Personal history of malignant neoplasm of bladder: Secondary | ICD-10-CM | POA: Insufficient documentation

## 2021-09-03 DIAGNOSIS — E1140 Type 2 diabetes mellitus with diabetic neuropathy, unspecified: Secondary | ICD-10-CM | POA: Insufficient documentation

## 2021-09-03 DIAGNOSIS — Z79899 Other long term (current) drug therapy: Secondary | ICD-10-CM | POA: Insufficient documentation

## 2021-09-03 DIAGNOSIS — Z7984 Long term (current) use of oral hypoglycemic drugs: Secondary | ICD-10-CM | POA: Insufficient documentation

## 2021-09-03 DIAGNOSIS — R739 Hyperglycemia, unspecified: Secondary | ICD-10-CM

## 2021-09-03 DIAGNOSIS — R531 Weakness: Secondary | ICD-10-CM

## 2021-09-03 DIAGNOSIS — I2510 Atherosclerotic heart disease of native coronary artery without angina pectoris: Secondary | ICD-10-CM | POA: Insufficient documentation

## 2021-09-03 DIAGNOSIS — I10 Essential (primary) hypertension: Secondary | ICD-10-CM | POA: Insufficient documentation

## 2021-09-03 DIAGNOSIS — Z7982 Long term (current) use of aspirin: Secondary | ICD-10-CM | POA: Insufficient documentation

## 2021-09-03 DIAGNOSIS — G8929 Other chronic pain: Secondary | ICD-10-CM | POA: Diagnosis present

## 2021-09-03 DIAGNOSIS — E1142 Type 2 diabetes mellitus with diabetic polyneuropathy: Secondary | ICD-10-CM | POA: Diagnosis present

## 2021-09-03 DIAGNOSIS — F3181 Bipolar II disorder: Secondary | ICD-10-CM | POA: Diagnosis present

## 2021-09-03 DIAGNOSIS — F17210 Nicotine dependence, cigarettes, uncomplicated: Secondary | ICD-10-CM | POA: Insufficient documentation

## 2021-09-03 DIAGNOSIS — Z794 Long term (current) use of insulin: Secondary | ICD-10-CM | POA: Insufficient documentation

## 2021-09-03 DIAGNOSIS — Z87898 Personal history of other specified conditions: Secondary | ICD-10-CM

## 2021-09-03 LAB — CBC
HCT: 44.3 % (ref 39.0–52.0)
Hemoglobin: 15.5 g/dL (ref 13.0–17.0)
MCH: 27.4 pg (ref 26.0–34.0)
MCHC: 35 g/dL (ref 30.0–36.0)
MCV: 78.3 fL — ABNORMAL LOW (ref 80.0–100.0)
Platelets: 223 10*3/uL (ref 150–400)
RBC: 5.66 MIL/uL (ref 4.22–5.81)
RDW: 13.3 % (ref 11.5–15.5)
WBC: 8.1 10*3/uL (ref 4.0–10.5)
nRBC: 0 % (ref 0.0–0.2)

## 2021-09-03 LAB — COMPREHENSIVE METABOLIC PANEL
ALT: 16 U/L (ref 0–44)
AST: 21 U/L (ref 15–41)
Albumin: 3.5 g/dL (ref 3.5–5.0)
Alkaline Phosphatase: 74 U/L (ref 38–126)
Anion gap: 12 (ref 5–15)
BUN: 21 mg/dL — ABNORMAL HIGH (ref 6–20)
CO2: 21 mmol/L — ABNORMAL LOW (ref 22–32)
Calcium: 9.3 mg/dL (ref 8.9–10.3)
Chloride: 101 mmol/L (ref 98–111)
Creatinine, Ser: 1.86 mg/dL — ABNORMAL HIGH (ref 0.61–1.24)
GFR, Estimated: 42 mL/min — ABNORMAL LOW (ref >60)
Glucose, Bld: 178 mg/dL — ABNORMAL HIGH (ref 70–99)
Potassium: 3.9 mmol/L (ref 3.5–5.1)
Sodium: 134 mmol/L — ABNORMAL LOW (ref 135–145)
Total Bilirubin: 0.4 mg/dL (ref 0.3–1.2)
Total Protein: 6.1 g/dL — ABNORMAL LOW (ref 6.5–8.1)

## 2021-09-03 LAB — TROPONIN I (HIGH SENSITIVITY): Troponin I (High Sensitivity): 5 ng/L (ref <18)

## 2021-09-03 LAB — SALICYLATE LEVEL: Salicylate Lvl: <7 mg/dL — ABNORMAL LOW (ref 7.0–30.0)

## 2021-09-03 LAB — ETHANOL: Alcohol, Ethyl (B): <10 mg/dL (ref <10)

## 2021-09-03 LAB — LACTIC ACID, PLASMA
Lactic Acid, Venous: 3 mmol/L (ref 0.5–1.9)
Lactic Acid, Venous: 2.4 mmol/L (ref 0.5–1.9)

## 2021-09-03 LAB — ACETAMINOPHEN LEVEL: Acetaminophen (Tylenol), Serum: 20 ug/mL (ref 10–30)

## 2021-09-03 LAB — CBG MONITORING, ED: Glucose-Capillary: 184 mg/dL — ABNORMAL HIGH (ref 70–99)

## 2021-09-03 MED ORDER — INSULIN ASPART 100 UNIT/ML IJ SOLN
0.0000 [IU] | Freq: Three times a day (TID) | INTRAMUSCULAR | Status: DC
  Administered 2021-09-04: 11 [IU] via SUBCUTANEOUS

## 2021-09-03 MED ORDER — VENLAFAXINE HCL ER 75 MG PO CP24
150.0000 mg | ORAL_CAPSULE | Freq: Every day | ORAL | Status: DC
  Administered 2021-09-04: 150 mg via ORAL
  Filled 2021-09-03: qty 2
  Filled 2021-09-03: qty 1

## 2021-09-03 MED ORDER — TAMSULOSIN HCL 0.4 MG PO CAPS
0.8000 mg | ORAL_CAPSULE | Freq: Every day | ORAL | Status: DC
  Administered 2021-09-04: 0.8 mg via ORAL
  Filled 2021-09-03: qty 2

## 2021-09-03 MED ORDER — LACTATED RINGERS IV SOLN: INTRAVENOUS | Status: DC

## 2021-09-03 MED ORDER — ACETAMINOPHEN 650 MG RE SUPP: 650.0000 mg | Freq: Four times a day (QID) | RECTAL | Status: DC | PRN

## 2021-09-03 MED ORDER — LACTATED RINGERS IV BOLUS
1000.0000 mL | Freq: Once | INTRAVENOUS | Status: AC
  Administered 2021-09-03: 1000 mL via INTRAVENOUS

## 2021-09-03 MED ORDER — ASPIRIN 81 MG PO TBEC
81.0000 mg | DELAYED_RELEASE_TABLET | Freq: Every day | ORAL | Status: DC
  Administered 2021-09-04: 81 mg via ORAL
  Filled 2021-09-03: qty 1

## 2021-09-03 MED ORDER — TRAZODONE HCL 100 MG PO TABS: 100.0000 mg | ORAL_TABLET | Freq: Every day | ORAL | Status: DC

## 2021-09-03 MED ORDER — PANTOPRAZOLE SODIUM 40 MG PO TBEC
80.0000 mg | DELAYED_RELEASE_TABLET | Freq: Every day | ORAL | Status: DC
  Administered 2021-09-04: 80 mg via ORAL
  Filled 2021-09-03: qty 2

## 2021-09-03 MED ORDER — VANCOMYCIN HCL IN DEXTROSE 1-5 GM/200ML-% IV SOLN
1000.0000 mg | Freq: Once | INTRAVENOUS | Status: AC
  Administered 2021-09-03: 1000 mg via INTRAVENOUS
  Filled 2021-09-03: qty 200

## 2021-09-03 MED ORDER — ONDANSETRON HCL 4 MG PO TABS: 4.0000 mg | ORAL_TABLET | Freq: Four times a day (QID) | ORAL | Status: DC | PRN

## 2021-09-03 MED ORDER — LAMOTRIGINE 100 MG PO TABS
150.0000 mg | ORAL_TABLET | Freq: Two times a day (BID) | ORAL | Status: DC
  Administered 2021-09-04 (×2): 150 mg via ORAL
  Filled 2021-09-03: qty 2
  Filled 2021-09-03: qty 1

## 2021-09-03 MED ORDER — INSULIN ASPART 100 UNIT/ML IJ SOLN: 0.0000 [IU] | Freq: Every day | INTRAMUSCULAR | Status: DC

## 2021-09-03 MED ORDER — ACETAMINOPHEN 325 MG PO TABS
650.0000 mg | ORAL_TABLET | Freq: Four times a day (QID) | ORAL | Status: DC | PRN
  Administered 2021-09-04: 650 mg via ORAL
  Filled 2021-09-03: qty 2

## 2021-09-03 MED ORDER — DULOXETINE HCL 60 MG PO CPEP
60.0000 mg | ORAL_CAPSULE | Freq: Every day | ORAL | Status: DC
  Administered 2021-09-04: 60 mg via ORAL
  Filled 2021-09-03: qty 1

## 2021-09-03 MED ORDER — SODIUM CHLORIDE 0.9 % IV SOLN
2.0000 g | Freq: Once | INTRAVENOUS | Status: AC
  Administered 2021-09-03: 2 g via INTRAVENOUS
  Filled 2021-09-03: qty 12.5

## 2021-09-03 MED ORDER — ENOXAPARIN SODIUM 40 MG/0.4ML IJ SOSY
40.0000 mg | PREFILLED_SYRINGE | INTRAMUSCULAR | Status: DC
  Administered 2021-09-04: 40 mg via SUBCUTANEOUS
  Filled 2021-09-03: qty 0.4

## 2021-09-03 MED ORDER — CLOPIDOGREL BISULFATE 75 MG PO TABS
75.0000 mg | ORAL_TABLET | Freq: Every day | ORAL | Status: DC
  Administered 2021-09-04: 75 mg via ORAL
  Filled 2021-09-03: qty 1

## 2021-09-03 MED ORDER — ROSUVASTATIN CALCIUM 5 MG PO TABS
10.0000 mg | ORAL_TABLET | Freq: Every day | ORAL | Status: DC
  Administered 2021-09-04: 10 mg via ORAL
  Filled 2021-09-03: qty 2

## 2021-09-03 MED ORDER — LURASIDONE HCL 20 MG PO TABS
60.0000 mg | ORAL_TABLET | Freq: Every day | ORAL | Status: DC
  Filled 2021-09-03: qty 1

## 2021-09-03 MED ORDER — ONDANSETRON HCL 4 MG/2ML IJ SOLN: 4.0000 mg | Freq: Four times a day (QID) | INTRAMUSCULAR | Status: DC | PRN

## 2021-09-03 MED ORDER — PREGABALIN 75 MG PO CAPS
225.0000 mg | ORAL_CAPSULE | Freq: Every morning | ORAL | Status: DC
  Administered 2021-09-04: 225 mg via ORAL
  Filled 2021-09-03: qty 3

## 2021-09-03 MED ORDER — PREGABALIN 75 MG PO CAPS: 450.0000 mg | ORAL_CAPSULE | Freq: Every day | ORAL | Status: DC

## 2021-09-03 NOTE — ED Triage Notes (Signed)
Patient BIB [facility] from home for complaint of generalized weakness, patient found to be hypotensive with EMS with BP 75 systolic. Patient is alert, oriented, and lethargic at this time. 20g saline lock in left wrist.

## 2021-09-03 NOTE — ED Provider Notes (Addendum)
[facility] EMERGENCY DEPARTMENT Provider Note   CSN: [id number] Arrival date & time: 09/03/21  1911     History  Chief Complaint  Patient presents with   Weakness    Vincent [name] is a 56 y.o. male.  Pt with generalized weakness. States symptoms started today. Ems was called and noted bp in 70s. Pt denies fever or chills. Denies trauma/fall. Denies any area of new or focal pain - states has chronic all over body pain. Is noted in EMR to have recent admission with overdose of morphine. Pt denies any overdose or overuse of his meds, and denies taking any opiate medication today.  Pt relatively poor historian. No headache. No neck pain/stiffness. Denies change in speech or vision. No new numbness/focal or unilateral weakness. Denies chest pain or sob. No  pain or nvd. No dysuria or gu c/o. Denies focal extremity pain or swelling.   The history is provided by the patient, medical records and the EMS personnel. The history is limited by the condition of the patient.  Weakness Associated symptoms: no abdominal pain, no chest pain, no cough, no diarrhea, no dysuria, no fever, no headaches, no shortness of breath and no vomiting        Home Medications Prior to Admission medications   Medication Sig Start Date End Date Taking? Authorizing Provider  amantadine (SYMMETREL) 100 MG capsule Take 1 capsule (100 mg total) by mouth daily. 08/05/21 09/04/21  Lauro Franklin, MD  amLODipine (NORVASC) 5 MG tablet Take 1 tablet (5 mg total) by mouth daily. 07/31/21   Danford, Earl Lites, MD  aspirin EC 81 MG EC tablet Take 1 tablet (81 mg total) by mouth daily. Swallow whole. 11/16/20   Tyrone Nine, MD  clopidogrel (PLAVIX) 75 MG tablet Take 1 tablet by mouth once daily 08/15/21   Levert Feinstein, MD  Continuous Blood Gluc Sensor (FREESTYLE LIBRE 2 SENSOR) MISC 1 Device by Does not apply route every 14 (fourteen) days. Patient taking differently: 1 Device by Does not apply route  every 14 (fourteen) days. Has not received- waiting on Insurance 02/25/20   Romero Belling, MD  DULoxetine (CYMBALTA) 60 MG capsule Take 1 capsule by mouth once daily 08/18/21   Levert Feinstein, MD  fenofibrate 160 MG tablet Take 160 mg by mouth daily. 12/31/19   [provider]  HUMALOG KWIKPEN 200 UNIT/ML KwikPen Inject 15 Units into the skin in the morning and at bedtime. 06/17/21   [provider]  lamoTRIgine (LAMICTAL) 150 MG tablet Take 1 tablet by mouth twice daily 08/18/21   Levert Feinstein, MD  Lurasidone HCl (LATUDA) 60 MG TABS Take 1 tablet (60 mg total) by mouth daily with breakfast. 08/31/21   Arfeen, Phillips Grout, MD  metFORMIN (GLUCOPHAGE-XR) 500 MG 24 hr tablet Take 500 mg by mouth 2 (two) times daily. 08/24/18   [provider]  nicotine (NICODERM CQ - DOSED IN MG/24 HOURS) 21 mg/24hr patch Place 1 patch (21 mg total) onto the skin daily. 08/05/21   Lauro Franklin, MD  omega-3 acid ethyl esters (LOVAZA) 1 g capsule Take 1 capsule (1 g total) by mouth 2 (two) times daily. 08/04/21   Lauro Franklin, MD  pregabalin (LYRICA) 225 MG capsule Take 1 capsule by mouth in the morning Patient taking differently: Take 225 mg by mouth See admin instructions. Pt takes 1 tablet in the morning and 2 tablets at night 03/01/21   Anson Fret, MD  rosuvastatin (CRESTOR) 10  MG tablet Take 1 tablet (10 mg total) by mouth daily. 08/04/21 09/03/21  Lauro Franklin, MD  tamsulosin (FLOMAX) 0.4 MG CAPS capsule Take 0.8 mg by mouth at bedtime.    [provider]  testosterone cypionate (DEPOTESTOSTERONE CYPIONATE) 200 MG/ML injection Inject 200 mg into the skin every 14 (fourteen) days. 07/03/21   [provider]  traZODone (DESYREL) 100 MG tablet Take 1 tablet (100 mg total) by mouth at bedtime as needed for sleep. 08/31/21   Arfeen, Phillips Grout, MD  venlafaxine XR (EFFEXOR-XR) 150 MG 24 hr capsule Take 1 capsule (150 mg total) by mouth daily with breakfast. 08/31/21   Arfeen,  Phillips Grout, MD  Vitamin D, Ergocalciferol, (DRISDOL) 1.25 MG (50000 UNIT) CAPS capsule Take 50,000 Units by mouth every Wednesday. 12/26/19   [provider]      Allergies    Celebrex [celecoxib], Hydrocodone, and Sulfa antibiotics    Review of Systems   Review of Systems  Constitutional:  Negative for chills, diaphoresis and fever.  HENT:  Negative for sore throat.   Eyes:  Negative for pain, redness and visual disturbance.  Respiratory:  Negative for cough and shortness of breath.   Cardiovascular:  Negative for chest pain and leg swelling.  Gastrointestinal:  Negative for abdominal pain, blood in stool, diarrhea and vomiting.  Genitourinary:  Negative for dysuria and flank pain.  Musculoskeletal:  Negative for back pain and neck pain.  Skin:  Negative for rash.  Neurological:  Positive for weakness. Negative for speech difficulty, numbness and headaches.  Hematological:  Does not bruise/bleed easily.  Psychiatric/Behavioral:  Negative for agitation.     Physical Exam Updated Vital Signs BP (!) 71/51 (BP Location: Right Arm)   Pulse 68   Resp 17   SpO2 99%  Physical Exam Vitals and nursing note reviewed.  Constitutional:      Appearance: Normal appearance. He is well-developed.  HENT:     Head: Atraumatic.     Nose: Nose normal.     Mouth/Throat:     Mouth: Mucous membranes are moist.     Pharynx: Oropharynx is clear.  Eyes:     General: No scleral icterus.    Conjunctiva/sclera: Conjunctivae normal.     Pupils: Pupils are equal, round, and reactive to light.     Comments: Pupils 4 mm, reactive.   Neck:     Vascular: No carotid bruit.     Trachea: No tracheal deviation.     Comments: No stiffness or rigidity.  Trachea midline. Thyroid not grossly enlarged or tender.  Cardiovascular:     Rate and Rhythm: Normal rate and regular rhythm.     Pulses: Normal pulses.     Heart sounds: Normal heart sounds. No murmur heard.    No friction rub. No gallop.   Pulmonary:     Effort: Pulmonary effort is normal. No accessory muscle usage or respiratory distress.     Breath sounds: Normal breath sounds.  Abdominal:     General: Bowel sounds are normal. There is no distension.     Palpations: Abdomen is soft.     Tenderness: There is no abdominal tenderness. There is no guarding.  Genitourinary:    Comments: No cva tenderness. Musculoskeletal:        General: No swelling or tenderness.     Cervical back: Normal range of motion and neck supple. No rigidity or tenderness.     Right lower leg: No edema.     Left lower  leg: No edema.  Skin:    General: Skin is warm and dry.     Findings: No rash.  Neurological:     Mental Status: He is alert.     Comments: Pt appears mildly drowsy/lethargic, slow to respond. Speech has slow quality but not grossly aphasic or dysarthric. Motor/sens grossly intact bil.  Psychiatric:     Comments: Slow to respond/drowsy. Denies SI or severe depression.      ED Results / Procedures / Treatments   Labs (all labs ordered are listed, but only abnormal results are displayed) Results for orders placed or performed during the hospital encounter of 09/03/21  CBC  Result Value Ref Range   WBC 8.1 4.0 - 10.5 K/uL   RBC 5.66 4.22 - 5.81 MIL/uL   Hemoglobin 15.5 13.0 - 17.0 g/dL   HCT 19.1 47.8 - 29.5 %   MCV 78.3 (L) 80.0 - 100.0 fL   MCH 27.4 26.0 - 34.0 pg   MCHC 35.0 30.0 - 36.0 g/dL   RDW 62.1 30.8 - 65.7 %   Platelets 223 150 - 400 K/uL   nRBC 0.0 0.0 - 0.2 %  Comprehensive metabolic panel  Result Value Ref Range   Sodium 134 (L) 135 - 145 mmol/L   Potassium 3.9 3.5 - 5.1 mmol/L   Chloride 101 98 - 111 mmol/L   CO2 21 (L) 22 - 32 mmol/L   Glucose, Bld 178 (H) 70 - 99 mg/dL   BUN 21 (H) 6 - 20 mg/dL   Creatinine, Ser 8.46 (H) 0.61 - 1.24 mg/dL   Calcium 9.3 8.9 - 96.2 mg/dL   Total Protein 6.1 (L) 6.5 - 8.1 g/dL   Albumin 3.5 3.5 - 5.0 g/dL   AST 21 15 - 41 U/L   ALT 16 0 - 44 U/L   Alkaline  Phosphatase 74 38 - 126 U/L   Total Bilirubin 0.4 0.3 - 1.2 mg/dL   GFR, Estimated 42 (L) >60 mL/min   Anion gap 12 5 - 15  Lactic acid, plasma  Result Value Ref Range   Lactic Acid, Venous 3.0 (HH) 0.5 - 1.9 mmol/L  Ethanol  Result Value Ref Range   Alcohol, Ethyl (B) <10 <10 mg/dL  Acetaminophen level  Result Value Ref Range   Acetaminophen (Tylenol), Serum 20 10 - 30 ug/mL  Salicylate level  Result Value Ref Range   Salicylate Lvl <7.0 (L) 7.0 - 30.0 mg/dL  CBG monitoring, ED  Result Value Ref Range   Glucose-Capillary 184 (H) 70 - 99 mg/dL  Troponin I (High Sensitivity)  Result Value Ref Range   Troponin I (High Sensitivity) 5 <18 ng/L     EKG EKG Interpretation  Date/Time:  Saturday September 03 2021 19:17:54 EDT Ventricular Rate:  64 PR Interval:  177 QRS Duration: 102 QT Interval:  417 QTC Calculation: 431 R Axis:   42 Text Interpretation: Sinus rhythm Nonspecific T wave abnormality Confirmed by Cathren Laine ([id number]) on 09/03/2021 7:21:02 PM  Radiology DG Chest Port 1 View  Result Date: 09/03/2021 CLINICAL DATA:  Weakness EXAM: PORTABLE CHEST 1 VIEW COMPARISON:  07/28/2021 FINDINGS: The heart size and mediastinal contours are within normal limits. Both lungs are clear. The visualized skeletal structures are unremarkable. IMPRESSION: No active disease. Electronically Signed   By: Alcide Clever M.D.   On: 09/03/2021 20:10    Procedures Procedures    Medications Ordered in ED Medications  lactated ringers bolus 1,000 mL (has no administration in time range)  ED Course/ Medical Decision Making/ A&P                           Medical Decision Making Problems Addressed: AKI (acute kidney injury) [facility]): acute illness or injury with systemic symptoms that poses a threat to life or bodily functions Dehydration: acute illness or injury with systemic symptoms that poses a threat to life or bodily functions Generalized weakness: acute illness or injury with systemic  symptoms Hypotension due to hypovolemia: acute illness or injury with systemic symptoms that poses a threat to life or bodily functions  Amount and/or Complexity of Data Reviewed Independent Historian: EMS    Details: hx External Data Reviewed: labs and notes. Labs: ordered. Decision-making details documented in ED Course. Radiology: ordered and independent interpretation performed. Decision-making details documented in ED Course. ECG/medicine tests: ordered and independent interpretation performed. Decision-making details documented in ED Course. Discussion of management or test interpretation with external provider(s): Hospitalists, discussed pt, labs.   Risk Prescription drug management. Decision regarding hospitalization.   Iv ns. Continuous pulse ox and cardiac monitoring. Labs ordered/sent. Imaging ordered.   Reviewed nursing notes and prior charts for additional history. External reports reviewed. Additional history from: EMS.  Cardiac monitor: sinus rhythm, rate 70.  Labs reviewed/interpreted by me - lactate high. Total 30 cc/kg  LR iv.  Wbc and hgb normal. Aki.   Xrays reviewed/interpreted by me - no pna.   LR bolus. Cultures sent.   With fluid boluses, bp improved.   Recheck, no new c/o, no new pain or other focal c/o. Abd soft nt. Chest cta. Afebrile.  Hospitalists consulted for admission. Discussed w Dr Julian Reil - will admit.   CRITICAL CARE  RE: hypotension, dehydration, aki Performed by: Suzi Roots Total critical care time: 45 minutes Critical care time was exclusive of separately billable procedures and treating other patients. Critical care was necessary to treat or prevent imminent or life-threatening deterioration. Critical care was time spent personally by me on the following activities: development of treatment plan with patient and/or surrogate as well as nursing, discussions with consultants, evaluation of patient's response to treatment, examination  of patient, obtaining history from patient or surrogate, ordering and performing treatments and interventions, ordering and review of laboratory studies, ordering and review of radiographic studies, pulse oximetry and re-evaluation of patient's condition.          Final Clinical Impression(s) / ED Diagnoses Final diagnoses:  None    Rx / DC Orders ED Discharge Orders     None         Cathren Laine, MD 09/03/21 2230

## 2021-09-04 ENCOUNTER — Encounter (HOSPITAL_COMMUNITY): Payer: Self-pay | Admitting: Internal Medicine

## 2021-09-04 ENCOUNTER — Other Ambulatory Visit: Payer: Self-pay

## 2021-09-04 DIAGNOSIS — N179 Acute kidney failure, unspecified: Secondary | ICD-10-CM | POA: Diagnosis not present

## 2021-09-04 DIAGNOSIS — R531 Weakness: Secondary | ICD-10-CM | POA: Diagnosis not present

## 2021-09-04 DIAGNOSIS — R739 Hyperglycemia, unspecified: Secondary | ICD-10-CM

## 2021-09-04 DIAGNOSIS — I959 Hypotension, unspecified: Secondary | ICD-10-CM | POA: Diagnosis not present

## 2021-09-04 DIAGNOSIS — D494 Neoplasm of unspecified behavior of bladder: Secondary | ICD-10-CM

## 2021-09-04 DIAGNOSIS — Z87898 Personal history of other specified conditions: Secondary | ICD-10-CM

## 2021-09-04 DIAGNOSIS — G8929 Other chronic pain: Secondary | ICD-10-CM

## 2021-09-04 DIAGNOSIS — E861 Hypovolemia: Secondary | ICD-10-CM

## 2021-09-04 DIAGNOSIS — E1142 Type 2 diabetes mellitus with diabetic polyneuropathy: Secondary | ICD-10-CM | POA: Diagnosis not present

## 2021-09-04 DIAGNOSIS — E860 Dehydration: Secondary | ICD-10-CM

## 2021-09-04 DIAGNOSIS — I9589 Other hypotension: Secondary | ICD-10-CM

## 2021-09-04 DIAGNOSIS — F3181 Bipolar II disorder: Secondary | ICD-10-CM

## 2021-09-04 LAB — CBC
HCT: 41.7 % (ref 39.0–52.0)
Hemoglobin: 14.6 g/dL (ref 13.0–17.0)
MCH: 27.3 pg (ref 26.0–34.0)
MCHC: 35 g/dL (ref 30.0–36.0)
MCV: 77.9 fL — ABNORMAL LOW (ref 80.0–100.0)
Platelets: 176 10*3/uL (ref 150–400)
RBC: 5.35 MIL/uL (ref 4.22–5.81)
RDW: 13.3 % (ref 11.5–15.5)
WBC: 6.7 10*3/uL (ref 4.0–10.5)
nRBC: 0 % (ref 0.0–0.2)

## 2021-09-04 LAB — URINALYSIS, COMPLETE (UACMP) WITH MICROSCOPIC
Bilirubin Urine: NEGATIVE
Glucose, UA: >=500 mg/dL — AB
Hgb urine dipstick: NEGATIVE
Ketones, ur: NEGATIVE mg/dL
Leukocytes,Ua: NEGATIVE
Nitrite: NEGATIVE
Protein, ur: NEGATIVE mg/dL
Specific Gravity, Urine: 1.024 (ref 1.005–1.030)
pH: 5 (ref 5.0–8.0)

## 2021-09-04 LAB — RAPID URINE DRUG SCREEN, HOSP PERFORMED
Amphetamines: NOT DETECTED
Barbiturates: NOT DETECTED
Benzodiazepines: POSITIVE — AB
Cocaine: NOT DETECTED
Opiates: POSITIVE — AB
Tetrahydrocannabinol: NOT DETECTED

## 2021-09-04 LAB — CORTISOL-AM, BLOOD: Cortisol - AM: 12.1 ug/dL (ref 6.7–22.6)

## 2021-09-04 LAB — COMPREHENSIVE METABOLIC PANEL
ALT: 16 U/L (ref 0–44)
AST: 23 U/L (ref 15–41)
Albumin: 3.1 g/dL — ABNORMAL LOW (ref 3.5–5.0)
Alkaline Phosphatase: 65 U/L (ref 38–126)
Anion gap: 10 (ref 5–15)
BUN: 19 mg/dL (ref 6–20)
CO2: 22 mmol/L (ref 22–32)
Calcium: 8.8 mg/dL — ABNORMAL LOW (ref 8.9–10.3)
Chloride: 102 mmol/L (ref 98–111)
Creatinine, Ser: 1.42 mg/dL — ABNORMAL HIGH (ref 0.61–1.24)
GFR, Estimated: 59 mL/min — ABNORMAL LOW (ref >60)
Glucose, Bld: 160 mg/dL — ABNORMAL HIGH (ref 70–99)
Potassium: 4.1 mmol/L (ref 3.5–5.1)
Sodium: 134 mmol/L — ABNORMAL LOW (ref 135–145)
Total Bilirubin: 0.3 mg/dL (ref 0.3–1.2)
Total Protein: 5.2 g/dL — ABNORMAL LOW (ref 6.5–8.1)

## 2021-09-04 LAB — PROCALCITONIN
Procalcitonin: 0.17 ng/mL
Procalcitonin: <0.1 ng/mL

## 2021-09-04 LAB — GLUCOSE, CAPILLARY
Glucose-Capillary: 141 mg/dL — ABNORMAL HIGH (ref 70–99)
Glucose-Capillary: 342 mg/dL — ABNORMAL HIGH (ref 70–99)

## 2021-09-04 LAB — LACTIC ACID, PLASMA
Lactic Acid, Venous: 2.1 mmol/L (ref 0.5–1.9)
Lactic Acid, Venous: 1.3 mmol/L (ref 0.5–1.9)

## 2021-09-04 LAB — PROTIME-INR
INR: 0.9 (ref 0.8–1.2)
Prothrombin Time: 12.2 seconds (ref 11.4–15.2)

## 2021-09-04 LAB — CBG MONITORING, ED: Glucose-Capillary: 137 mg/dL — ABNORMAL HIGH (ref 70–99)

## 2021-09-04 NOTE — Discharge Summary (Signed)
Physician Discharge Summary   Patient: Vincent [name] MRN: [id number] DOB: 1965/07/23  Admit date:     09/03/2021  Discharge date: 09/04/21  Discharge Physician: Brendia Sacks   PCP: Dois Davenport, MD   Patient left AMA, eloped from room without being seen The following summary is taken from chart review  Discharge Diagnoses: Principal Problem:   Hypotension Active Problems:   AKI (acute kidney injury) (HCC)   Bladder tumor   Bipolar II disorder, most recent episode major depressive (HCC)   Chronic pain   DM type 2 with diabetic peripheral neuropathy (HCC)   History of seizure  Resolved Problems:   * No resolved hospital problems. *  Hospital Course: 56 year old man PMH including bipolar disorder, diabetes mellitus type 2, recurrent bladder cancer, seizure disorder on AED, rheumatoid arthritis, recent admission for suicide attempt, presented to ED 6/24 for generalized weakness, muscle spasms.  Placed in observation for hypotension of unclear etiology without evidence to suggest infection.  Lactic acid returned to normal with volume.  CXR NAD. Procalcitonin was low.  No leukocytosis or fever.  Urinalysis was negative.  Urine drug screen was positive for opiates and benzodiazepines.  Blood pressure responded well to fluids without recurrence of hypotension.  Other issues included acute kidney injury presumably secondary to hypotension.  Comorbidities apparently stable from review of record. 6/25 patient eloped from room and was not seen by Clinical research associate.  AKI Generalized weakness Hypotension Dehydration  * Hypotension --Etiology unclear but given response to fluids with improvement in renal function as well as elevated BUN, would favor dehydration. --No signs or symptoms of infection as detailed above. Normotensive by chart review.  Per staff patient walked out.  I was not notified.  AKI (acute kidney injury) (HCC) --Likely secondary to hypotension, prerenal.  Responded to fluids.   Expect spontaneous resolution at this point.  History of seizure --No history to suggest seizure.  DM type 2 with diabetic peripheral neuropathy (HCC) --Mild random hyperglycemia.  Normal anion gap.  Chronic pain --No history to suggest overdose.  Bipolar II disorder, most recent episode major depressive (HCC) --Was continued on home medicines.     Consultants:  None   Procedures performed:  None   Disposition: left AMA  DISCHARGE MEDICATION: Allergies as of 09/04/2021       Reactions   Celebrex [celecoxib] Anaphylaxis, Rash   Hydrocodone Rash, Other (See Comments)   "blisters developed on arms"   Sulfa Antibiotics Rash        Medication List     ASK your doctor about these medications    acetaminophen 500 MG tablet Commonly known as: TYLENOL Take 1,000 mg by mouth every 6 (six) hours as needed for moderate pain or headache.   aspirin EC 81 MG tablet Take 1 tablet (81 mg total) by mouth daily. Swallow whole.   clopidogrel 75 MG tablet Commonly known as: PLAVIX Take 1 tablet by mouth once daily   DULoxetine 60 MG capsule Commonly known as: CYMBALTA Take 1 capsule by mouth once daily   fenofibrate 160 MG tablet Take 160 mg by mouth daily.   HumaLOG KwikPen 200 UNIT/ML KwikPen Generic drug: insulin lispro Inject 15 Units into the skin in the morning and at bedtime.   lamoTRIgine 150 MG tablet Commonly known as: LAMICTAL Take 1 tablet by mouth twice daily   Lurasidone HCl 60 MG Tabs Commonly known as: Latuda Take 1 tablet (60 mg total) by mouth daily with breakfast.   metFORMIN 500 MG 24  hr tablet Commonly known as: GLUCOPHAGE-XR Take 500 mg by mouth 2 (two) times daily.   omeprazole 40 MG capsule Commonly known as: PRILOSEC Take 40 mg by mouth daily.   pregabalin 225 MG capsule Commonly known as: LYRICA Take 1 capsule by mouth in the morning   rosuvastatin 10 MG tablet Commonly known as: CRESTOR Take 1 tablet (10 mg total) by mouth  daily.   tamsulosin 0.4 MG Caps capsule Commonly known as: FLOMAX Take 0.8 mg by mouth at bedtime.   testosterone cypionate 200 MG/ML injection Commonly known as: DEPOTESTOSTERONE CYPIONATE Inject 200 mg into the skin every 14 (fourteen) days.   traZODone 100 MG tablet Commonly known as: DESYREL Take 1 tablet (100 mg total) by mouth at bedtime as needed for sleep.   venlafaxine XR 150 MG 24 hr capsule Commonly known as: EFFEXOR-XR Take 1 capsule (150 mg total) by mouth daily with breakfast.   Vitamin D (Ergocalciferol) 1.25 MG (50000 UNIT) Caps capsule Commonly known as: DRISDOL Take 50,000 Units by mouth every Wednesday.        Discharge Exam not done: left AMA without being seen [facility] Weights   09/04/21 0204  Weight: 92.7 kg  AFVSS No recurrent hypotension documented No hypoxia  Creatinine 1.86 > 1.42 (baseline 0.86) Lactate 3, 2.4, 2.1, 1.3 CBC WNL Cortisol WNL CXR NAD EKG SR NSST changes  Condition at discharge: left AMA  The results of significant diagnostics from this hospitalization (including imaging, microbiology, ancillary and laboratory) are listed below for reference.   Imaging Studies: DG Chest Port 1 View  Result Date: 09/03/2021 CLINICAL DATA:  Weakness EXAM: PORTABLE CHEST 1 VIEW COMPARISON:  07/28/2021 FINDINGS: The heart size and mediastinal contours are within normal limits. Both lungs are clear. The visualized skeletal structures are unremarkable. IMPRESSION: No active disease. Electronically Signed   By: Alcide Clever M.D.   On: 09/03/2021 20:10    Microbiology: Results for orders placed or performed during the hospital encounter of 09/03/21  Blood culture (routine x 2)     Status: None (Preliminary result)   Collection Time: 09/03/21  7:28 PM   Specimen: BLOOD  Result Value Ref Range Status   Specimen Description BLOOD RIGHT ANTECUBITAL  Final   Special Requests   Final    BOTTLES DRAWN AEROBIC AND ANAEROBIC Blood Culture adequate volume    Culture   Final    NO GROWTH < 12 HOURS Performed at [facility], [address], [place], [place] [address]    Report Status PENDING  Incomplete  Blood culture (routine x 2)     Status: None (Preliminary result)   Collection Time: 09/03/21  8:55 PM   Specimen: BLOOD  Result Value Ref Range Status   Specimen Description BLOOD BLOOD RIGHT WRIST  Final   Special Requests   Final    BOTTLES DRAWN AEROBIC AND ANAEROBIC Blood Culture adequate volume   Culture   Final    NO GROWTH < 12 HOURS Performed at [facility], [address] [place], [place] [address]    Report Status PENDING  Incomplete    Labs: CBC: Recent Labs  Lab 09/03/21 1928 09/04/21 0225  WBC 8.1 6.7  HGB 15.5 14.6  HCT 44.3 41.7  MCV 78.3* 77.9*  PLT 223 176   Basic Metabolic Panel: Recent Labs  Lab 09/03/21 1928 09/04/21 0225  NA 134* 134*  K 3.9 4.1  CL 101 102  CO2 21* 22  GLUCOSE 178* 160*  BUN 21* 19  CREATININE 1.86* 1.42*  CALCIUM 9.3 8.8*   Liver Function Tests: Recent Labs  Lab 09/03/21 1928 09/04/21 0225  AST 21 23  ALT 16 16  ALKPHOS 74 65  BILITOT 0.4 0.3  PROT 6.1* 5.2*  ALBUMIN 3.5 3.1*   CBG: Recent Labs  Lab 09/03/21 1919 09/04/21 0018 09/04/21 0156 09/04/21 0559  GLUCAP 184* 137* 141* 342*    Discharge time spent: less than 30 minutes.  Signed: Brendia Sacks, MD [facility] 09/04/2021

## 2021-09-04 NOTE — H&P (Addendum)
History and Physical    Patient: Vincent [name]:[id number] DOB: May 10, 1965 DOA: 09/03/2021 DOS: the patient was seen and examined on 09/04/2021 PCP: Dois Davenport, MD  Patient coming from: Home  Chief Complaint:  Chief Complaint  Patient presents with   Weakness   HPI: Vincent [name] is a 56 y.o. male with medical history significant of BPD2, DM2, recurrent bladder cancer s/p TURBT, seizure disorder on AEDs, RA not currently on DMARDs.  Pt with recent admission for suicide attempt by OD on opiate medication last month.  Pt presents to ED with generalized weakness.  Pt was feeling at baseline today when he had sudden onset of full body muscle spasms at home he relates.  EMS was called.  He took his normal AM meds earlier today but hasnt taken anything extra for pain.  Took no opiates today.  EMS found pt to have SBP 75.  Pt brought in to ED.   Review of Systems: As mentioned in the history of present illness. All other systems reviewed and are negative. Past Medical History:  Diagnosis Date   Anxiety    Benign essential tremor    Bipolar 2 disorder [facility])    followed by [facility]--- dr s. Lolly Mustache   Bladder cancer [facility])    recurrent   CAD (coronary artery disease)    cardiac cath 2003  and 2011 both showed normal coronary arteries w/ preserved lvf;  Non obstructive on CTA Oct 2019.    Chronic pain syndrome    back---- [facility] [place]   Cold extremities    BLE   COPD (chronic obstructive pulmonary disease) (HCC)    DDD (degenerative disc disease), lumbar    Diabetic peripheral neuropathy (HCC)    Gastroparesis    followed by dr Marina Goodell   GERD (gastroesophageal reflux disease)    Hiatal hernia    History of bladder cancer urologist-  previously dr Ronal Fear;  now dr gay   papillay TCC (Ta G1)  s/p TURBT and chemo instillation 2014   History of chest pain 12/2017   heart cath normal   History of encephalopathy 05/27/2015   admission w/ acute  encephalopathy thought to be secondary to pain meds and COPD   History of gastric ulcer    History of Helicobacter pylori infection    History of kidney stones    History of TIA (transient ischemic attack) 2008  and 10-19-2018    no residual's   History of traumatic head injury 2010   w/ LOC  per pt needed stitches, hit in head with a mower blade   Hyperlipidemia    Hypertension    Hypogonadism male    s/p  bilateral orchiectomy   Hypothyroidism    Insomnia    Mild obstructive sleep apnea    study in epic 12-04-2016, no cpap   PTSD (post-traumatic stress disorder)    chronic   PTSD (post-traumatic stress disorder)    RA (rheumatoid arthritis) (HCC)    followed by [facility]   Seizures, transient [facility]) neurologist-  dr Terrace Arabia--  differential dx complex partial seizure .vs.  mood disorder .vs.  pseudoseizure--  negative EEG's   confusion episodes and staring spells since 11/ 2015   (03-26-2020 per pt wife last seizure 10 /2021)   Transient confusion NEUOROLOGIST-  DR Terrace Arabia   Episodes since 11/ 2015--  neurologist dx  differential complex partial seizure  .vs. mood disorder . vs. pseudoseizure   Type 2 diabetes  mellitus treated with insulin [facility])    endocrinologist--- dr Everardo All---  (03-26-2020 pt does not check blood sugar at home)   Past Surgical History:  Procedure Laterality Date   AMPUTATION Left 04/28/2020   Procedure: LEFT LITTLE FINGER AMPUTATION;  Surgeon: Nadara Mustard, MD;  Location: [facility] OR;  Service: Orthopedics;  Laterality: Left;   CARDIAC CATHETERIZATION  12-27-2001  DR Jacinto Halim  &  05-26-2009  DR Eldridge Dace   RESULTS FOR BOTH ARE NORMAL CORONARIES AND PERSERVED LVF/ EF 60%   CARPAL TUNNEL RELEASE Bilateral right 09-16-2003;  left ?   CARPAL TUNNEL RELEASE Left 02/25/2015   Procedure: LEFT CARPAL TUNNEL RELEASE;  Surgeon: Betha Loa, MD;  Location: [facility];  Service: Orthopedics;  Laterality: Left;   CYSTOSCOPY N/A 10/10/2012   Procedure:  CYSTOSCOPY CLOT EVACUATION FULGERATION OF BLEEDERS ;  Surgeon: Garnett Farm, MD;  Location[facility];  Service: Urology;  Laterality: N/A;   CYSTOSCOPY WITH BIOPSY N/A 11/26/2015   Procedure: CYSTOSCOPY WITH BIOPSY AND FULGURATION;  Surgeon: Ihor Gully, MD;  Location[facility];  Service: Urology;  Laterality: N/A;   ESOPHAGOGASTRODUODENOSCOPY  2014   LAPAROSCOPIC CHOLECYSTECTOMY  11-17-2010   ORCHIECTOMY Right 02/21/2016   Procedure: SCROTAL ORCHIECTOMY with TESTICULAR PROSTHESIS IMPLANT;  Surgeon: Ihor Gully, MD;  Location: [facility];  Service: Urology;  Laterality: Right;   ORCHIECTOMY Left 09/02/2018   Procedure: ORCHIECTOMY;  Surgeon: Ihor Gully, MD;  Location: [facility];  Service: Urology;  Laterality: Left;   ROTATOR CUFF REPAIR Right 12/2004   TRANSURETHRAL RESECTION OF BLADDER TUMOR N/A 08/09/2012   Procedure: TRANSURETHRAL RESECTION OF BLADDER TUMOR (TURBT) WITH GYRUS WITH MITOMYCIN C;  Surgeon: Garnett Farm, MD;  Location: [facility];  Service: Urology;  Laterality: N/A;   TRANSURETHRAL RESECTION OF BLADDER TUMOR N/A 03/29/2020   Procedure: TRANSURETHRAL RESECTION OF BLADDER TUMOR (TURBT) and post-op instillation of gemcitabine;  Surgeon: [name], MD;  Location: [facility];  Service: Urology;  Laterality: N/A;   TRANSURETHRAL RESECTION OF BLADDER TUMOR WITH GYRUS (TURBT-GYRUS) N/A 02/27/2014   Procedure: TRANSURETHRAL RESECTION OF BLADDER TUMOR WITH GYRUS (TURBT-GYRUS);  Surgeon: Garnett Farm, MD;  Location: [facility];  Service: Urology;  Laterality: N/A;   Social History:  reports that he has been smoking cigarettes. He has a 57.00 pack-year smoking history. He has never used smokeless tobacco. He reports that he does not drink alcohol and does not use drugs.  Allergies  Allergen Reactions   Celebrex [Celecoxib] Anaphylaxis and Rash   Hydrocodone Rash  and Other (See Comments)    "blisters developed on arms"   Sulfa Antibiotics Rash    Family History  Problem Relation Age of Onset   Diabetes Mother    Diabetes Father    Hypertension Father    Heart attack Father 77       died age 56   Alcohol abuse Father    Colon cancer Neg Hx    Esophageal cancer Neg Hx    Stomach cancer Neg Hx    Rectal cancer Neg Hx     Prior to Admission medications   Medication Sig Start Date End Date Taking? Authorizing Provider  acetaminophen (TYLENOL) 500 MG tablet Take 1,000 mg by mouth every 6 (six) hours as needed for moderate pain or headache.   Yes [provider]  aspirin EC 81 MG EC tablet Take 1 tablet (81 mg total) by mouth  daily. Swallow whole. Patient taking differently: Take 81 mg by mouth at bedtime. Swallow whole. 11/16/20  Yes Tyrone Nine, MD  clopidogrel (PLAVIX) 75 MG tablet Take 1 tablet by mouth once daily Patient taking differently: Take 75 mg by mouth daily. 08/15/21  Yes Levert Feinstein, MD  DULoxetine (CYMBALTA) 60 MG capsule Take 1 capsule by mouth once daily Patient taking differently: Take 60 mg by mouth daily. 08/18/21  Yes Levert Feinstein, MD  fenofibrate 160 MG tablet Take 160 mg by mouth daily. 12/31/19  Yes [provider]  HUMALOG KWIKPEN 200 UNIT/ML KwikPen Inject 15 Units into the skin in the morning and at bedtime. 06/17/21  Yes [provider]  lamoTRIgine (LAMICTAL) 150 MG tablet Take 1 tablet by mouth twice daily Patient taking differently: Take 150 mg by mouth 2 (two) times daily. 08/18/21  Yes Levert Feinstein, MD  Lurasidone HCl (LATUDA) 60 MG TABS Take 1 tablet (60 mg total) by mouth daily with breakfast. Patient taking differently: Take 60 mg by mouth at bedtime. 08/31/21  Yes Arfeen, Phillips Grout, MD  metFORMIN (GLUCOPHAGE-XR) 500 MG 24 hr tablet Take 500 mg by mouth 2 (two) times daily. 08/24/18  Yes [provider]  omeprazole (PRILOSEC) 40 MG capsule Take 40 mg by mouth daily. 08/23/21  Yes [provider]  pregabalin (LYRICA) 225 MG capsule Take 1 capsule by mouth in the morning Patient taking differently: Take 225 mg by mouth See admin instructions. 1 capsule in the morning 2 capsules at bedtime 03/01/21  Yes Anson Fret, MD  rosuvastatin (CRESTOR) 10 MG tablet Take 1 tablet (10 mg total) by mouth daily. 08/04/21 09/03/21 Yes Pashayan, Mardelle Matte, MD  tamsulosin (FLOMAX) 0.4 MG CAPS capsule Take 0.8 mg by mouth at bedtime.   Yes [provider]  testosterone cypionate (DEPOTESTOSTERONE CYPIONATE) 200 MG/ML injection Inject 200 mg into the skin every 14 (fourteen) days. 07/03/21  Yes [provider]  traZODone (DESYREL) 100 MG tablet Take 1 tablet (100 mg total) by mouth at bedtime as needed for sleep. Patient taking differently: Take 100 mg by mouth at bedtime. 08/31/21  Yes Arfeen, Phillips Grout, MD  venlafaxine XR (EFFEXOR-XR) 150 MG 24 hr capsule Take 1 capsule (150 mg total) by mouth daily with breakfast. 08/31/21  Yes Arfeen, Phillips Grout, MD  Vitamin D, Ergocalciferol, (DRISDOL) 1.25 MG (50000 UNIT) CAPS capsule Take 50,000 Units by mouth every Wednesday. 12/26/19  Yes [provider]    Physical Exam: Vitals:   09/03/21 2200 09/03/21 2230 09/03/21 2300 09/03/21 2330  BP: 109/67 (!) 99/53 (!) 106/53 (!) 107/57  Pulse: (!) 58 (!) 57 (!) 58 (!) 58  Resp: 16 16  16   Temp:      TempSrc:      SpO2: 98% 99% 99% 100%   Constitutional: NAD, calm, comfortable Eyes: PERRL, lids and conjunctivae normal ENMT: Mucous membranes are moist. Posterior pharynx clear of any exudate or lesions.Normal dentition.  Neck: normal, supple, no masses, no thyromegaly Respiratory: clear to auscultation bilaterally, no wheezing, no crackles. Normal respiratory effort. No accessory muscle use.  Cardiovascular: Regular rate and rhythm, no murmurs / rubs / gallops. No extremity edema. 2+ pedal pulses. No carotid bruits.  Abdomen: no tenderness, no masses palpated. No  hepatosplenomegaly. Bowel sounds positive.  Musculoskeletal: no clubbing / cyanosis. No joint deformity upper and lower extremities. Good ROM, no contractures. Normal muscle tone.  Skin: no rashes, lesions, ulcers. No induration Neurologic: CN 2-12 grossly intact. Sensation intact,  DTR normal. Strength 5/5 in all 4.  Psychiatric: AAOx3 though speech and responses slightly slowed  Data Reviewed:    CBC    Component Value Date/Time   WBC 8.1 09/03/2021 1928   RBC 5.66 09/03/2021 1928   HGB 15.5 09/03/2021 1928   HGB 16.5 11/22/2016 1130   HCT 44.3 09/03/2021 1928   HCT 49.7 11/22/2016 1130   PLT 223 09/03/2021 1928   PLT 249 11/22/2016 1130   MCV 78.3 (L) 09/03/2021 1928   MCV 90 11/22/2016 1130   MCH 27.4 09/03/2021 1928   MCHC 35.0 09/03/2021 1928   RDW 13.3 09/03/2021 1928   RDW 13.6 11/22/2016 1130   LYMPHSABS 1.7 07/31/2021 1833   LYMPHSABS 2.0 11/22/2016 1130   MONOABS 0.4 07/31/2021 1833   EOSABS 0.2 07/31/2021 1833   EOSABS 0.1 11/22/2016 1130   BASOSABS 0.1 07/31/2021 1833   BASOSABS 0.0 11/22/2016 1130      Latest Ref Rng & Units 09/03/2021    7:28 PM 07/31/2021    6:33 PM 07/28/2021    6:46 AM  CMP  Glucose 70 - 99 mg/dL 841  324  401   BUN 6 - 20 mg/dL 21  13  20    Creatinine 0.61 - 1.24 mg/dL 0.27  2.53  6.64   Sodium 135 - 145 mmol/L 134  138  139   Potassium 3.5 - 5.1 mmol/L 3.9  3.6  4.2   Chloride 98 - 111 mmol/L 101  107  105   CO2 22 - 32 mmol/L 21  23  26    Calcium 8.9 - 10.3 mg/dL 9.3  8.9  8.9   Total Protein 6.5 - 8.1 g/dL 6.1  6.8    Total Bilirubin 0.3 - 1.2 mg/dL 0.4  0.6    Alkaline Phos 38 - 126 U/L 74  58    AST 15 - 41 U/L 21  16    ALT 0 - 44 U/L 16  14     CXR = no acute findings  Lactate 3.0, improved to 2.4 on repeat  Assessment and Plan: * Hypotension Unclear cause. Given empiric ABx in ED for ? Of sepsis; however, sepsis seems unlikely: 1) no SIRS 2) Procalcitonin only 0.17 Possibly just dehydration? BCx pending Continue  IVF since good response of BP to initial 3L bolus. Tele monitor UA pending  AKI (acute kidney injury) (HCC) Pt with creat 1.8 today. Looks like this was 0.9 on discharge 5/21. Likely pre-renal / ATN secondary to hypotension IVF Strict intake and output Repeat BMP in AM Get imaging of kidneys if not improving rapidly with normotension.  History of seizure Continue AEDs  DM type 2 with diabetic peripheral neuropathy (HCC) Hold home metformin and novolog. Mod scale SSI AC/HS  Chronic pain Cont home lyrica. OD not suspected this time around.  Bipolar II disorder, most recent episode major depressive (HCC) Cont home psych meds.  Bladder tumor S/p TURBT, followed by urology.      Advance Care Planning:   Code Status: Full Code  Consults: none  Family Communication: Family at bedside  Severity of Illness: The appropriate patient status for this patient is OBSERVATION. Observation status is judged to be reasonable and necessary in order to provide the required intensity of service to ensure the patient's safety. The patient's presenting symptoms, physical exam findings, and initial radiographic and laboratory data in the context of their medical condition is felt to place them at decreased risk for further clinical deterioration. Furthermore, it  is anticipated that the patient will be medically stable for discharge from the hospital within 2 midnights of admission.   Author: Hillary Bow., DO 09/04/2021 12:10 AM  For on call review [email or web address].

## 2021-09-08 LAB — CULTURE, BLOOD (ROUTINE X 2)
Culture: NO GROWTH
Culture: NO GROWTH
Special Requests: ADEQUATE
Special Requests: ADEQUATE

## 2021-09-15 ENCOUNTER — Other Ambulatory Visit: Payer: Self-pay | Admitting: Neurology

## 2021-09-16 ENCOUNTER — Other Ambulatory Visit (HOSPITAL_COMMUNITY): Payer: Self-pay | Admitting: Psychiatry

## 2021-09-16 DIAGNOSIS — F3181 Bipolar II disorder: Secondary | ICD-10-CM

## 2021-09-20 ENCOUNTER — Ambulatory Visit: Payer: Medicare HMO | Admitting: Podiatry

## 2021-09-29 ENCOUNTER — Telehealth (HOSPITAL_COMMUNITY): Payer: No Typology Code available for payment source | Admitting: Psychiatry

## 2021-09-30 ENCOUNTER — Other Ambulatory Visit: Payer: Self-pay | Admitting: Neurology

## 2021-09-30 MED ORDER — PREGABALIN 225 MG PO CAPS: 225.0000 mg | ORAL_CAPSULE | Freq: Three times a day (TID) | ORAL | 50 refills | Status: DC

## 2021-09-30 NOTE — Telephone Encounter (Signed)
Pt is out of his  pregabalin (LYRICA) 225 MG capsule, and being told the office is denying him refills.  Pt has a f/u for 08-01, please call pt re: if anything can be done to get medication until appointment

## 2021-09-30 NOTE — Telephone Encounter (Signed)
Per last note: increased Lyrica to 225 mg 3 times a day ___________________________________ One month supply request will be sent to the MD for approval. He will need to keep his pending appt 10/11/2021. ____________________________________ I called and left the patient a message with this update.

## 2021-10-03 ENCOUNTER — Other Ambulatory Visit: Payer: Self-pay

## 2021-10-03 MED ORDER — PREGABALIN 225 MG PO CAPS: 225.0000 mg | ORAL_CAPSULE | Freq: Three times a day (TID) | ORAL | 50 refills | Status: DC

## 2021-10-03 NOTE — Progress Notes (Signed)
Verify Drug Registry For Pregabalin 225 Mg Capsule Last Filled: 08/21/2021 Quantity: 60 capsules for 30 days Last appointment: 07/28/2020 Next appointment: 10/11/2021

## 2021-10-11 ENCOUNTER — Ambulatory Visit: Payer: Medicare Other | Admitting: Neurology

## 2021-10-11 NOTE — Progress Notes (Deleted)
Patient: Vincent [name] [facility] Date of Birth: 14-Apr-1965  Reason for Visit: Follow up History from: Patient Primary Neurologist: [name]    ASSESSMENT AND PLAN 56 y.o. year old male     HISTORY  Vincent [name] is a 56 year old, seen in request by Dr. Tenny Craw for evaluation of right leg pain, gait abnormality,he is accompanied by his wife at today's visit on April 24, 2019.   I have reviewed and summarized the referring note from the referring physician.  He has past medical history of hypertension, hyperlipidemia,  insulin-dependent poorly controlled diabetes, bipolar disorder, history of bladder cancer, chronic pain, is under pain management, longtime smoker,   Our office saw him since 2015, because he had recurrent episode of confusion, some nocturnal seizure-like episode, previous work-up including MRI of the brain, and EEG was normal in 2015.  He was started on lamotrigine, tolerating it well, it has helped his recurrent spells, he is now taking lamotrigine 150 mg daily, last visit with office was in October 2018 with [name].   Previous EMG nerve conduction study in November 2019 has confirmed length dependent moderate axonal sensorimotor polyneuropathy, consistent with his poorly controlled diabetes   He was able to ambulate without assistant until end of October 2020, woke up 1 morning, noticed deep achy pain at right anterior and medial time, radiating to right medial leg, and ankle, his pain gradually getting worse, 3 weeks later, he noticed gradual onset right leg weakness, to the point of difficulty picking up his right hip against gravity, at the same time, his right leg pain intensified,   He was seen by neurosurgeon [name], who referred him for EMG nerve conduction study by [name] on April 08, 2019, there is evidence of severe sensorimotor polyneuropathy, in addition, there is evidence of active denervation at right vastus lateralis, medialis, iliopsoas,  adductor longus, but there was no paraspinal muscle active denervation's.  Above findings with the possibility of diabetic amyotrophy   Since his symptoms onset end of October 2020, he continue complains of gradual worsening right thigh pain, right leg weakness, gait abnormality.  He is receiving home physical therapy   I was able to review his office visit at [facility] on February 20, 2019, he has been under pain management for extended period of time, previously taking fentanyl 75 mcg per 72 hours, Lyrica 150 mg 3 times daily, previously was a patient of [facility], when that office was closed, he seek pain management at [place], had his first right sacroiliac joint injection on February 20, 2019 by [name]     Personally reviewed MRIs from [place] in February 2021 MRI of cervical spine mild degenerative changes, no significant canal or foraminal narrowing MRI of brain: No significant abnormality MRI of lumbar spine in November 2020 it was normal MRI of the brain October 20, 2018 that was normal   Laboratory evaluations in 2020, A1c was 10.3, normal CBC, BMP with exception of elevated glucose 298,   UPDATE Jul 28 2020: He is accompanied by his wife at today's clinical visit, he presented to emergency room on June 11, 2020 reported acute onset of dysarthria, right-sided weakness, numbness, involving face, arm, and leg, also complains of chest pain, shortness of breath, lightheadedness   He was treated as acute stroke, after CT head showed no significant abnormality, and no large vessel disease on CT angiogram of head and neck, he was giving IV tPA, ICU for close  monitoring, MRI of the brain reviewed with patient, no acute abnormality   He was discharged home with Plavix 75 mg   He had transient hyponatremia, acute renal failure, improved with IV hydration   Today his main concern is bilateral feet paresthesia, pain, difficulty bearing weight, A1c was 11.4,  is under the care of endocrinologist [name]   He has poor functional status, spent most of the time in sitting position, no recurrent seizure-like activity, also carries a diagnosis of bipolar disorder, on polypharmacy treatment, including combination of gabapentin, and Lyrica 200 mg 3 times a day  Update 10/11/21 SS:   Admitted 11/14/20 for left-sided numbness and weakness.  Glucose 542, MRI negative for acute infarct.  EF 60 to 65%, LDL 137, A1c 11.8, creatinine 1.73.  Historically multiple admissions for focal neurological deficit in the setting of hyperglycemia greater than 500.3 weeks DAPT, then Plavix 75 mg.  In the ER 06/06/21 for right arm numbness, weakness, MRI negative for acute problem.  Treated with prednisone.  Admitted 07/28/21 following suicide attempt with overdose of morphine (felt he took 3-4 tablets), EMS gave 2 narcans, after 2nd her had generalized seizure.  Had inpatient psychiatric admission  Admitted 09/03/21 for generalized weakness, muscle spasms, hypotension.  Left AMA.    REVIEW OF SYSTEMS: Out of a complete 14 system review of symptoms, the patient complains only of the following symptoms, and all other reviewed systems are negative.  See HPI  ALLERGIES: Allergies  Allergen Reactions   Celebrex [Celecoxib] Anaphylaxis and Rash   Hydrocodone Rash and Other (See Comments)    "blisters developed on arms"   Sulfa Antibiotics Rash    HOME MEDICATIONS: Outpatient Medications Prior to Visit  Medication Sig Dispense Refill   acetaminophen (TYLENOL) 500 MG tablet Take 1,000 mg by mouth every 6 (six) hours as needed for moderate pain or headache.     aspirin EC 81 MG EC tablet Take 1 tablet (81 mg total) by mouth daily. Swallow whole. (Patient taking differently: Take 81 mg by mouth at bedtime. Swallow whole.) 30 tablet 0   clopidogrel (PLAVIX) 75 MG tablet Take 1 tablet by mouth once daily (Patient taking differently: Take 75 mg by mouth daily.) 30 tablet 2    DULoxetine (CYMBALTA) 60 MG capsule Take 1 capsule by mouth once daily (Patient taking differently: Take 60 mg by mouth daily.) 30 capsule 2   fenofibrate 160 MG tablet Take 160 mg by mouth daily.     HUMALOG KWIKPEN 200 UNIT/ML KwikPen Inject 15 Units into the skin in the morning and at bedtime.     lamoTRIgine (LAMICTAL) 150 MG tablet Take 1 tablet by mouth twice daily (Patient taking differently: Take 150 mg by mouth 2 (two) times daily.) 60 tablet 2   Lurasidone HCl (LATUDA) 60 MG TABS Take 1 tablet (60 mg total) by mouth daily with breakfast. (Patient taking differently: Take 60 mg by mouth at bedtime.) 30 tablet 1   metFORMIN (GLUCOPHAGE-XR) 500 MG 24 hr tablet Take 500 mg by mouth 2 (two) times daily.     omeprazole (PRILOSEC) 40 MG capsule Take 40 mg by mouth daily.     pregabalin (LYRICA) 225 MG capsule Take 1 capsule (225 mg total) by mouth 3 (three) times daily. 90 capsule 50   rosuvastatin (CRESTOR) 10 MG tablet Take 1 tablet (10 mg total) by mouth daily. 30 tablet 0   tamsulosin (FLOMAX) 0.4 MG CAPS capsule Take 0.8 mg by mouth at bedtime.  testosterone cypionate (DEPOTESTOSTERONE CYPIONATE) 200 MG/ML injection Inject 200 mg into the skin every 14 (fourteen) days.     traZODone (DESYREL) 100 MG tablet Take 1 tablet (100 mg total) by mouth at bedtime as needed for sleep. (Patient taking differently: Take 100 mg by mouth at bedtime.) 30 tablet 1   venlafaxine XR (EFFEXOR-XR) 150 MG 24 hr capsule Take 1 capsule (150 mg total) by mouth daily with breakfast. 30 capsule 1   Vitamin D, Ergocalciferol, (DRISDOL) 1.25 MG (50000 UNIT) CAPS capsule Take 50,000 Units by mouth every Wednesday.     No facility-administered medications prior to visit.    PAST MEDICAL HISTORY: Past Medical History:  Diagnosis Date   Anxiety    Benign essential tremor    Bipolar 2 disorder ([place])    followed by [facility]--- dr s. Adele Schilder   Bladder cancer [facility])    recurrent   CAD (coronary artery disease)    cardiac  cath 2003  and 2011 both showed normal coronary arteries w/ preserved lvf;  Non obstructive on CTA Oct 2019.    Chronic pain syndrome    back---- [facility] [place]   Cold extremities    BLE   COPD (chronic obstructive pulmonary disease) ([place])    DDD (degenerative disc disease), lumbar    Diabetic peripheral neuropathy ([place])    Gastroparesis    followed by dr Henrene Pastor   GERD (gastroesophageal reflux disease)    Hiatal hernia    History of bladder cancer urologist-  previously dr Consuella Lose;  now dr gay   papillay TCC (Ta G1)  s/p TURBT and chemo instillation 2014   History of chest pain 12/2017   heart cath normal   History of encephalopathy 05/27/2015   admission w/ acute encephalopathy thought to be secondary to pain meds and COPD   History of gastric ulcer    History of Helicobacter pylori infection    History of kidney stones    History of TIA (transient ischemic attack) 2008  and 10-19-2018    no residual's   History of traumatic head injury 2010   w/ LOC  per pt needed stitches, hit in head with a mower blade   Hyperlipidemia    Hypertension    Hypogonadism male    s/p  bilateral orchiectomy   Hypothyroidism    Insomnia    Mild obstructive sleep apnea    study in epic 12-04-2016, no cpap   PTSD (post-traumatic stress disorder)    chronic   PTSD (post-traumatic stress disorder)    RA (rheumatoid arthritis) ([place])    followed by [facility]   Seizures, transient [facility]) neurologist-  dr Krista Blue--  differential dx complex partial seizure .vs.  mood disorder .vs.  pseudoseizure--  negative EEG's   confusion episodes and staring spells since 11/ 2015   (03-26-2020 per pt wife last seizure 10 /2021)   Transient confusion NEUOROLOGIST-  DR Krista Blue   Episodes since 11/ 2015--  neurologist dx  differential complex partial seizure  .vs. mood disorder . vs. pseudoseizure   Type 2 diabetes mellitus treated with insulin [facility])    endocrinologist--- dr Loanne Drilling---   (03-26-2020 pt does not check blood sugar at home)    PAST SURGICAL HISTORY: Past Surgical History:  Procedure Laterality Date   AMPUTATION Left 04/28/2020   Procedure: LEFT LITTLE FINGER AMPUTATION;  Surgeon: Newt Minion, MD;  Location: [place];  Service: Orthopedics;  Laterality: Left;   CARDIAC CATHETERIZATION  12-27-2001  DR Einar Gip  &  05-26-2009  DR VARANASI   RESULTS FOR BOTH ARE NORMAL CORONARIES AND PERSERVED LVF/ EF 60%   CARPAL TUNNEL RELEASE Bilateral right 09-16-2003;  left ?   CARPAL TUNNEL RELEASE Left 02/25/2015   Procedure: LEFT CARPAL TUNNEL RELEASE;  Surgeon: Leanora Cover, MD;  Location: [place];  Service: Orthopedics;  Laterality: Left;   CYSTOSCOPY N/A 10/10/2012   Procedure: CYSTOSCOPY CLOT EVACUATION FULGERATION OF BLEEDERS ;  Surgeon: Claybon Jabs, MD;  Location: [facility];  Service: Urology;  Laterality: N/A;   CYSTOSCOPY WITH BIOPSY N/A 11/26/2015   Procedure: CYSTOSCOPY WITH BIOPSY AND FULGURATION;  Surgeon: Kathie Rhodes, MD;  Location: [facility];  Service: Urology;  Laterality: N/A;   ESOPHAGOGASTRODUODENOSCOPY  2014   LAPAROSCOPIC CHOLECYSTECTOMY  11-17-2010   ORCHIECTOMY Right 02/21/2016   Procedure: SCROTAL ORCHIECTOMY with TESTICULAR PROSTHESIS IMPLANT;  Surgeon: Kathie Rhodes, MD;  Location: [facility];  Service: Urology;  Laterality: Right;   ORCHIECTOMY Left 09/02/2018   Procedure: ORCHIECTOMY;  Surgeon: Kathie Rhodes, MD;  Location[facility];  Service: Urology;  Laterality: Left;   ROTATOR CUFF REPAIR Right 12/2004   TRANSURETHRAL RESECTION OF BLADDER TUMOR N/A 08/09/2012   Procedure: TRANSURETHRAL RESECTION OF BLADDER TUMOR (TURBT) WITH GYRUS WITH MITOMYCIN C;  Surgeon: Claybon Jabs, MD;  Location[facility];  Service: Urology;  Laterality: N/A;   TRANSURETHRAL RESECTION OF BLADDER TUMOR N/A 03/29/2020   Procedure: TRANSURETHRAL RESECTION OF BLADDER TUMOR  (TURBT) and post-op instillation of gemcitabine;  Surgeon: Janith Lima, MD;  Location: [facility];  Service: Urology;  Laterality: N/A;   TRANSURETHRAL RESECTION OF BLADDER TUMOR WITH GYRUS (TURBT-GYRUS) N/A 02/27/2014   Procedure: TRANSURETHRAL RESECTION OF BLADDER TUMOR WITH GYRUS (TURBT-GYRUS);  Surgeon: Claybon Jabs, MD;  Location: [facility];  Service: Urology;  Laterality: N/A;    FAMILY HISTORY: Family History  Problem Relation Age of Onset   Diabetes Mother    Diabetes Father    Hypertension Father    Heart attack Father 13       died age 54   Alcohol abuse Father    Colon cancer Neg Hx    Esophageal cancer Neg Hx    Stomach cancer Neg Hx    Rectal cancer Neg Hx     SOCIAL HISTORY: Social History   Socioeconomic History   Marital status: Married    Spouse name: Not on file   Number of children: 3   Years of education: GED   Highest education level: Not on file  Occupational History   Occupation: Engineer, technical sales    Comment: Owner of company  Tobacco Use   Smoking status: Every Day    Packs/day: 1.50    Years: 38.00    Total pack years: 57.00    Types: Cigarettes   Smokeless tobacco: Never  Vaping Use   Vaping Use: Never used  Substance and Sexual Activity   Alcohol use: No   Drug use: No   Sexual activity: Not Currently    Partners: Female    Birth control/protection: None  Other Topics Concern   Not on file  Social History Narrative   Lives at home with his wife and children.   Left-handed.   3-4 cups caffeine per day.   Social Determinants of Health   Financial Resource Strain: Not on file  Food Insecurity: Not on file  Transportation Needs: Not on  file  Physical Activity: Not on file  Stress: Not on file  Social Connections: Not on file  Intimate Partner Violence: Not on file    PHYSICAL EXAM  There were no vitals filed for this visit. There is no height or weight on file to calculate  BMI.  Generalized: Well developed, in no acute distress  Neurological examination  Mentation: Alert oriented to time, place, history taking. Follows all commands speech and language fluent Cranial nerve II-XII: Pupils were equal round reactive to light. Extraocular movements were full, visual field were full on confrontational test. Facial sensation and strength were normal. Uvula tongue midline. Head turning and shoulder shrug  were normal and symmetric. Motor: The motor testing reveals 5 over 5 strength of all 4 extremities. Good symmetric motor tone is noted throughout.  Sensory: Sensory testing is intact to soft touch on all 4 extremities. No evidence of extinction is noted.  Coordination: Cerebellar testing reveals good finger-nose-finger and heel-to-shin bilaterally.  Gait and station: Gait is normal. Tandem gait is normal. Romberg is negative. No drift is seen.  Reflexes: Deep tendon reflexes are symmetric and normal bilaterally.   DIAGNOSTIC DATA (LABS, IMAGING, TESTING) - I reviewed patient records, labs, notes, testing and imaging myself where available.  Lab Results  Component Value Date   WBC 6.7 09/04/2021   HGB 14.6 09/04/2021   HCT 41.7 09/04/2021   MCV 77.9 (L) 09/04/2021   PLT 176 09/04/2021      Component Value Date/Time   NA 134 (L) 09/04/2021 0225   NA 137 04/01/2015 1006   K 4.1 09/04/2021 0225   CL 102 09/04/2021 0225   CO2 22 09/04/2021 0225   GLUCOSE 160 (H) 09/04/2021 0225   BUN 19 09/04/2021 0225   BUN 14 04/01/2015 1006   CREATININE 1.42 (H) 09/04/2021 0225   CALCIUM 8.8 (L) 09/04/2021 0225   PROT 5.2 (L) 09/04/2021 0225   PROT 6.0 04/01/2015 1006   ALBUMIN 3.1 (L) 09/04/2021 0225   ALBUMIN 4.2 04/01/2015 1006   AST 23 09/04/2021 0225   ALT 16 09/04/2021 0225   ALKPHOS 65 09/04/2021 0225   BILITOT 0.3 09/04/2021 0225   BILITOT 0.3 04/01/2015 1006   GFRNONAA 59 (L) 09/04/2021 0225   GFRAA 46 (L) 08/09/2019 1206   Lab Results  Component Value  Date   CHOL 210 (H) 08/03/2021   HDL 39 (L) 08/03/2021   LDLCALC UNABLE TO CALCULATE IF TRIGLYCERIDE OVER 400 mg/dL 08/03/2021   LDLDIRECT 120.6 (H) 08/03/2021   TRIG 445 (H) 08/03/2021   CHOLHDL 5.4 08/03/2021   Lab Results  Component Value Date   HGBA1C 14.1 (H) 07/28/2021   Lab Results  Component Value Date   ZOXWRUEA54 098 07/31/2021   Lab Results  Component Value Date   TSH 4.116 07/31/2021    Butler Denmark, AGNP-C, DNP 10/11/2021, 5:46 AM [facility] [address], [place], [place] [address] [phone]

## 2021-10-12 ENCOUNTER — Encounter: Payer: Self-pay | Admitting: Neurology

## 2021-10-12 ENCOUNTER — Ambulatory Visit (INDEPENDENT_AMBULATORY_CARE_PROVIDER_SITE_OTHER): Payer: No Typology Code available for payment source | Admitting: Neurology

## 2021-10-12 ENCOUNTER — Encounter (HOSPITAL_COMMUNITY): Payer: Self-pay | Admitting: Psychiatry

## 2021-10-12 ENCOUNTER — Telehealth (HOSPITAL_BASED_OUTPATIENT_CLINIC_OR_DEPARTMENT_OTHER): Payer: No Typology Code available for payment source | Admitting: Psychiatry

## 2021-10-12 VITALS — Wt 196.0 lb

## 2021-10-12 DIAGNOSIS — F4312 Post-traumatic stress disorder, chronic: Secondary | ICD-10-CM | POA: Diagnosis not present

## 2021-10-12 DIAGNOSIS — R251 Tremor, unspecified: Secondary | ICD-10-CM

## 2021-10-12 DIAGNOSIS — F3181 Bipolar II disorder: Secondary | ICD-10-CM | POA: Diagnosis not present

## 2021-10-12 MED ORDER — CLOPIDOGREL BISULFATE 75 MG PO TABS: 75.0000 mg | ORAL_TABLET | Freq: Every day | ORAL | 5 refills | Status: DC

## 2021-10-12 MED ORDER — LURASIDONE HCL 60 MG PO TABS: 60.0000 mg | ORAL_TABLET | Freq: Every day | ORAL | 2 refills | Status: DC

## 2021-10-12 MED ORDER — VENLAFAXINE HCL ER 150 MG PO CP24: 150.0000 mg | ORAL_CAPSULE | Freq: Every day | ORAL | 1 refills | Status: DC

## 2021-10-12 MED ORDER — TRAZODONE HCL 100 MG PO TABS: 100.0000 mg | ORAL_TABLET | Freq: Every evening | ORAL | 1 refills | Status: DC | PRN

## 2021-10-12 MED ORDER — LAMOTRIGINE 150 MG PO TABS
150.0000 mg | ORAL_TABLET | Freq: Two times a day (BID) | ORAL | 11 refills | Status: DC
Start: 2021-10-12 — End: 2022-08-25

## 2021-10-12 MED ORDER — PREGABALIN 225 MG PO CAPS: 225.0000 mg | ORAL_CAPSULE | Freq: Two times a day (BID) | ORAL | 0 refills | Status: DC

## 2021-10-12 MED ORDER — DULOXETINE HCL 60 MG PO CPEP
60.0000 mg | ORAL_CAPSULE | Freq: Every day | ORAL | 11 refills | Status: DC
Start: 2021-10-12 — End: 2022-08-25

## 2021-10-12 NOTE — Patient Instructions (Addendum)
Reduce the dose of Lyrica to 225 mg twice daily, I will refill for 1 month, please discuss with PCP about refilling going forward Keep other medications Call for any seizures Continue to work on control of your diabetes  Meds ordered this encounter  Medications   lamoTRIgine (LAMICTAL) 150 MG tablet    Sig: Take 1 tablet (150 mg total) by mouth 2 (two) times daily.    Dispense:  60 tablet    Refill:  11   DULoxetine (CYMBALTA) 60 MG capsule    Sig: Take 1 capsule (60 mg total) by mouth daily.    Dispense:  30 capsule    Refill:  11   clopidogrel (PLAVIX) 75 MG tablet    Sig: Take 1 tablet (75 mg total) by mouth daily.    Dispense:  30 tablet    Refill:  5
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
## 2021-10-12 NOTE — Progress Notes (Addendum)
Virtual Visit via telephone Note  I connected with Vincent [name] on 10/12/21 at  3:00 PM EDT by telephone and verified that I am speaking with the correct person using two identifiers.  Location: Patient: Home Provider: Home Office   I discussed the limitations of evaluation and management by telemedicine and the availability of in person appointments. The patient expressed understanding and agreed to proceed.  History of Present Illness: Patient is evaluated by phone session.  He is taking his medication as prescribed.  He is actually doing much better since he had a better control on his blood sugar.  His primary care physician who manages his diabetes had put a sensor and that helps to regulate his blood sugar.  He had lost weight.  He is more active.  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  He has tremors on his hand but recently a neurologist suggested nerve conduction studies and is hoping that helps his chronic pain, neuropathy and tremors.  He is compliant with Effexor, Latuda and trazodone and medicines are managed by his daughter.  He also takes Lyrica, Cymbalta and Lamictal.  Patient had visited emergency room in June after he found weak and dehydrated.  He left the hospital after he was given fluid.  His sleep is improved.  He denies any mania, psychosis, hallucination.  Since he is sleeping better he has no more nightmares and flashback.  He checks his blood sugar every day and numbers are improving.  We do not have recent hemoglobin A1c as last hemoglobin A1c was very high.  Denies any hallucination, paranoia, suicidal thoughts.    Past Psychiatric History: Reviewed. H/O one brief hospitalization at [facility] due to suicidal thoughts.  No h/o suicidal attempt.  H/O anger, anxiety, PTSD and heavy substance use.  Claims to be sober for while. Tried Depakote, Zoloft, lithium, Prozac and Xanax. [name] helped but stopped due to polypharmacy.     Psychiatric Specialty Exam: Physical Exam  Review of Systems  Weight 196 lb (88.9 kg).There is no height or weight on file to calculate BMI.  General Appearance: NA  Eye Contact:  NA  Speech:  Slow  Volume:  Decreased  Mood:  Euthymic  Affect:  NA  Thought Process:  Descriptions of Associations: Intact  Orientation:  Full (Time, Place, and Person)  Thought Content:  Logical  Suicidal Thoughts:  No  Homicidal Thoughts:  No  Memory:  Immediate;   Fair Recent;   Fair Remote;   Fair  Judgement:  Intact  Insight:  Present  Psychomotor Activity:  Tremor  Concentration:  Concentration: Fair and Attention Span: Fair  Recall:  [employer] of Knowledge:  Fair  Language:  Fair  Akathisia:  No  Handed:  Right  AIMS (if indicated):     Assets:  Communication Skills Desire for Improvement Housing Social Support  ADL's:  Intact  Cognition:  WNL  Sleep:   8-9 hrs      Assessment and Plan: Bipolar disorder type II.  PTSD.  Tremors.  Patient doing much better since his blood sugar is controlled.  He is sleeping good.  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  He lost weight.  His daughter helps managing medication.  He has tremors and he is going to have a nerve conduction study soon.  He wants to keep his current medication.  We will continue Latuda 60 mg daily, trazodone 100 mg at bedtime asneeded and Effexor 150 mg daily.  Discussed medication side effects and benefits.  Recommended to call us back with any question or any concern.  Follow-up in 3 months.   Follow Up Instructions:    I discussed the assessment and treatment plan with the patient. The patient was provided an opportunity to ask questions and all were answered. The patient agreed with the plan and demonstrated an understanding of the instructions.   The patient was advised to call back or seek an in-person evaluation if the symptoms worsen or if the condition fails to improve as  anticipated.  Collaboration of Care: Other provider involved in patient's care AEB notes are available in epic to review.  Patient/Guardian was advised Release of Information must be obtained prior to any record release in order to collaborate their care with an outside provider. Patient/Guardian was advised if they have not already done so to contact the registration department to sign all necessary forms in order for [place] to release information regarding their care.   Consent: Patient/Guardian gives verbal consent for treatment and assignment of benefits for services provided during this visit. Patient/Guardian expressed understanding and agreed to proceed.    I provided 16 minutes of non-face-to-face time during this encounter.   Kathlee Nations, MD

## 2021-10-25 ENCOUNTER — Encounter (HOSPITAL_BASED_OUTPATIENT_CLINIC_OR_DEPARTMENT_OTHER): Payer: No Typology Code available for payment source | Attending: Internal Medicine | Admitting: Internal Medicine

## 2021-10-25 DIAGNOSIS — S61200A Unspecified open wound of right index finger without damage to nail, initial encounter: Secondary | ICD-10-CM | POA: Diagnosis not present

## 2021-10-25 DIAGNOSIS — I10 Essential (primary) hypertension: Secondary | ICD-10-CM | POA: Diagnosis not present

## 2021-10-25 DIAGNOSIS — G40909 Epilepsy, unspecified, not intractable, without status epilepticus: Secondary | ICD-10-CM | POA: Insufficient documentation

## 2021-10-25 DIAGNOSIS — S61203A Unspecified open wound of left middle finger without damage to nail, initial encounter: Secondary | ICD-10-CM

## 2021-10-25 DIAGNOSIS — T23201A Burn of second degree of right hand, unspecified site, initial encounter: Secondary | ICD-10-CM | POA: Diagnosis not present

## 2021-10-25 DIAGNOSIS — X088XXA Exposure to other specified smoke, fire and flames, initial encounter: Secondary | ICD-10-CM | POA: Insufficient documentation

## 2021-10-25 DIAGNOSIS — S61002A Unspecified open wound of left thumb without damage to nail, initial encounter: Secondary | ICD-10-CM | POA: Diagnosis not present

## 2021-10-25 DIAGNOSIS — J449 Chronic obstructive pulmonary disease, unspecified: Secondary | ICD-10-CM | POA: Insufficient documentation

## 2021-10-25 DIAGNOSIS — T23291A Burn of second degree of multiple sites of right wrist and hand, initial encounter: Secondary | ICD-10-CM | POA: Diagnosis not present

## 2021-10-25 DIAGNOSIS — S61001A Unspecified open wound of right thumb without damage to nail, initial encounter: Secondary | ICD-10-CM | POA: Insufficient documentation

## 2021-10-25 DIAGNOSIS — E11622 Type 2 diabetes mellitus with other skin ulcer: Secondary | ICD-10-CM | POA: Insufficient documentation

## 2021-10-25 DIAGNOSIS — S61202A Unspecified open wound of right middle finger without damage to nail, initial encounter: Secondary | ICD-10-CM | POA: Insufficient documentation

## 2021-10-25 DIAGNOSIS — L98492 Non-pressure chronic ulcer of skin of other sites with fat layer exposed: Secondary | ICD-10-CM | POA: Diagnosis not present

## 2021-10-25 DIAGNOSIS — F17210 Nicotine dependence, cigarettes, uncomplicated: Secondary | ICD-10-CM | POA: Diagnosis not present

## 2021-10-25 DIAGNOSIS — T23292A Burn of second degree of multiple sites of left wrist and hand, initial encounter: Secondary | ICD-10-CM | POA: Diagnosis not present

## 2021-10-26 NOTE — Progress Notes (Addendum)
KELVIS, BERGER ([id number]) Visit Report for 10/25/2021 Allergy List Details Patient Name: Date of Service: [place], EDWA RD W. 10/25/2021 1:15 PM Medical Record Number: [id number] Patient Account Number: [id number] Date of Birth/Sex: Treating RN: February 25, 1966 (56 y.o. [name] Primary Care Zakir Henner: Hayden Rasmussen Other Clinician: Referring Shaquia Berkley: Treating Rashawna Scoles/Extender: Cheri Guppy in Treatment: 0 Allergies Active Allergies hydrocodone Reaction: rash Severity: Moderate Celebrex Reaction: Anaphylaxis Severity: Severe Sulfa (Sulfonamide Antibiotics) Reaction: rash Severity: Moderate Allergy Notes Electronic Signature(s) Signed: 10/25/2021 5:00:27 PM By: Lorrin Jackson Previous Signature: 10/24/2021 11:44:34 AM Version By: Lorrin Jackson Entered By: Lorrin Jackson on 10/25/2021 13:16:56 -------------------------------------------------------------------------------- Arrival Information Details Patient Name: Date of Service: [place], EDWA RD W. 10/25/2021 1:15 PM Medical Record Number: [id number] Patient Account Number: [id number] Date of Birth/Sex: Treating RN: Apr 09, 1965 (56 y.o. [name] Primary Care Rosielee Corporan: Hayden Rasmussen Other Clinician: Referring Therasa Lorenzi: Treating Madge Therrien/Extender: Cheri Guppy in Treatment: 0 Visit Information Patient Arrived: Ambulatory Arrival Time: 13:16 Transfer Assistance: None Patient Identification Verified: Yes Secondary Verification Process Completed: Yes Patient Requires Transmission-Based Precautions: No Patient Has Alerts: No Electronic Signature(s) Signed: 10/25/2021 5:00:27 PM By: Lorrin Jackson Entered By: Onnie Boer, History Since Last Visit Added or deleted any medications: No Any new allergies or adverse reactions: No Had a fall or experienced change in activities of daily living that may affect risk of falls: No Signs or symptoms of abuse/neglect  since last visito No Hospitalized since last visit: No Implantable device outside of the clinic excluding cellular tissue based products placed in the center since last visit: No Has Dressing in Place as Prescribed: Yes Pain Present Now: No Jodi on 10/25/2021 13:16:34 -------------------------------------------------------------------------------- Clinic Level of Care Assessment Details Patient Name: Date of Service: [place], EDWA RD W. 10/25/2021 1:15 PM Medical Record Number: [id number] Patient Account Number: [id number] Date of Birth/Sex: Treating RN: November 26, 1965 (56 y.o. [name] Primary Care Krishon Adkison: Hayden Rasmussen Other Clinician: Referring Brihanna Devenport: Treating Zimri Brennen/Extender: Cheri Guppy in Treatment: 0 Clinic Level of Care Assessment Items TOOL 1 Quantity Score X- 1 0 Use when EandM and Procedure is performed on INITIAL visit ASSESSMENTS - Nursing Assessment / Reassessment X- 1 20 General Physical Exam (combine w/ comprehensive assessment (listed just below) when performed on new pt. evals) X- 1 25 Comprehensive Assessment (HX, ROS, Risk Assessments, Wounds Hx, etc.) ASSESSMENTS - Wound and Skin Assessment / Reassessment '[]'$  - 0 Dermatologic / Skin Assessment (not related to wound area) ASSESSMENTS - Ostomy and/or Continence Assessment and Care '[]'$  - 0 Incontinence Assessment and Management '[]'$  - 0 Ostomy Care Assessment and Management (repouching, etc.) PROCESS - Coordination of Care '[]'$  - 0 Simple Patient / Family Education for ongoing care X- 1 20 Complex (extensive) Patient / Family Education for ongoing care X- 1 10 Staff obtains Programmer, systems, Records, T Results / Process Orders est '[]'$  - 0 Staff telephones HHA, Nursing Homes / Clarify orders / etc '[]'$  - 0 Routine Transfer to another Facility (non-emergent condition) '[]'$  - 0 Routine Hospital Admission (non-emergent condition) '[]'$  - 0 New Admissions / Biomedical engineer /  Ordering NPWT Apligraf, etc. , '[]'$  - 0 Emergency Hospital Admission (emergent condition) PROCESS - Special Needs '[]'$  - 0 Pediatric / Minor Patient Management '[]'$  - 0 Isolation Patient Management '[]'$  - 0 Hearing / Language / Visual special needs '[]'$  - 0 Assessment of Community assistance (transportation, D/C planning, etc.) '[]'$  - 0 Additional assistance / Altered mentation '[]'$  - 0  Support Surface(s) Assessment (bed, cushion, seat, etc.) INTERVENTIONS - Miscellaneous '[]'$  - 0 External ear exam '[]'$  - 0 Patient Transfer (multiple staff / Civil Service fast streamer / Similar devices) '[]'$  - 0 Simple Staple / Suture removal (25 or less) '[]'$  - 0 Complex Staple / Suture removal (26 or more) '[]'$  - 0 Hypo/Hyperglycemic Management (do not check if billed separately) '[]'$  - 0 Ankle / Brachial Index (ABI) - do not check if billed separately Has the patient been seen at the hospital within the last three years: Yes Total Score: 75 Level Of Care: New/Established - Level 2 Electronic Signature(s) Signed: 10/25/2021 5:00:27 PM By: Lorrin Jackson Entered By: Lorrin Jackson on 10/25/2021 14:06:25 -------------------------------------------------------------------------------- Encounter Discharge Information Details Patient Name: Date of Service: [place], EDWA RD W. 10/25/2021 1:15 PM Medical Record Number: [id number] Patient Account Number: [id number] Date of Birth/Sex: Treating RN: March 11, 1966 (56 y.o. [name] Primary Care Dayron Odland: Hayden Rasmussen Other Clinician: Referring Ascencion Coye: Treating Natacia Chaisson/Extender: Cheri Guppy in Treatment: 0 Encounter Discharge Information Items Post Procedure Vitals Discharge Condition: Stable Temperature (F): 98.3 Ambulatory Status: Ambulatory Pulse (bpm): 84 Discharge Destination: Home Respiratory Rate (breaths/min): 16 Transportation: Private Auto Blood Pressure (mmHg): 129/70 Schedule Follow-up Appointment: Yes Clinical Summary of Care:  Provided on 10/25/2021 Form Type Recipient Paper Patient Patient Electronic Signature(s) Signed: 10/25/2021 5:00:27 PM By: Lorrin Jackson Entered By: Lorrin Jackson on 10/25/2021 14:24:26 -------------------------------------------------------------------------------- Lower Extremity Assessment Details Patient Name: Date of Service: [place], EDWA RD W. 10/25/2021 1:15 PM Medical Record Number: [id number] Patient Account Number: [id number] Date of Birth/Sex: Treating RN: [birth year] (56 y.o. [name] Primary Care Axxel Gude: Hayden Rasmussen Other Clinician: Referring Natsuko Kelsay: Treating Savion Washam/Extender: Cheri Guppy in Treatment: 0 Electronic Signature(s) Signed: 10/25/2021 5:00:27 PM By: Lorrin Jackson Entered By: Lorrin Jackson on 10/25/2021 13:19:53 -------------------------------------------------------------------------------- Multi Wound Chart Details Patient Name: Date of Service: [place], EDWA RD W. 10/25/2021 1:15 PM Medical Record Number: [id number] Patient Account Number: [id number] Date of Birth/Sex: Treating RN: 05-04-1965 (56 y.o. [name] Primary Care Delorse Shane: Hayden Rasmussen Other Clinician: Referring Kayann Maj: Treating Maysen Bonsignore/Extender: Cheri Guppy in Treatment: 0 Vital Signs Height(in): 75 Capillary Blood Glucose(mg/dl): 278 Weight(lbs): 198 Pulse(bpm): 84 Body Mass Index(BMI): 24.7 Blood Pressure(mmHg): 129/70 Temperature(F): 98.3 Respiratory Rate(breaths/min): 16 Photos: Right Hand - 2nd Digit Left Hand - 3rd Digit Left Hand - 1st Digit Wound Location: Thermal Burn Thermal Burn Thermal Burn Wounding Event: 2nd degree Burn 2nd degree Burn 2nd degree Burn Primary Etiology: Anemia, Chronic Obstructive Anemia, Chronic Obstructive Anemia, Chronic Obstructive Comorbid History: Pulmonary Disease (COPD), Sleep Pulmonary Disease (COPD), Sleep Pulmonary Disease (COPD), Sleep Apnea, Coronary  Artery Disease, Apnea, Coronary Artery Disease, Apnea, Coronary Artery Disease, Hypertension, Type II Diabetes, Hypertension, Type II Diabetes, Hypertension, Type II Diabetes, Rheumatoid Arthritis, Neuropathy, Rheumatoid Arthritis, Neuropathy, Rheumatoid Arthritis, Neuropathy, Seizure Disorder, Received Seizure Disorder, Received Seizure Disorder, Received Chemotherapy Chemotherapy Chemotherapy 09/21/2021 09/21/2021 09/21/2021 Date Acquired: 0 0 0 Weeks of Treatment: Open Open Open Wound Status: No No No Wound Recurrence: 2.5x2x0.2 3x1.8x0.23 1.4x0.9x0.2 Measurements L x W x D (cm) 3.927 4.241 0.99 A (cm) : rea 0.785 0.975 0.198 Volume (cm) : 0.00% N/A 76.70% % Reduction in A rea: 0.00% N/A 76.70% % Reduction in Volume: Full Thickness Without Exposed Full Thickness Without Exposed Full Thickness Without Exposed Classification: Support Structures Support Structures Support Structures Medium Medium Medium Exudate A mount: Serosanguineous Serosanguineous Serosanguineous Exudate Type: red, brown red, brown red, brown Exudate Color: Small (1-33%)  Medium (34-66%) Medium (34-66%) Granulation A mount: Large (67-100%) Medium (34-66%) Medium (34-66%) Necrotic A mount: Eschar, Adherent Slough Eschar Eschar, Adherent Slough Necrotic Tissue: None None None Epithelialization: Debridement - Selective/Open Wound Debridement - Excisional Debridement - Excisional Debridement: Pre-procedure Verification/Time Out 13:49 13:49 13:49 Taken: Lidocaine 4% Topical Solution Lidocaine 4% Topical Solution Lidocaine 4% Topical Solution Pain Control: Necrotic/Eschar Subcutaneous, Slough Subcutaneous, Slough Tissue Debrided: Non-Viable Tissue Skin/Subcutaneous Tissue Skin/Subcutaneous Tissue Level: 5 5.4 1.26 Debridement A (sq cm): rea Curette Curette Curette Instrument: Minimum Minimum Minimum Bleeding: Pressure Pressure Pressure Hemostasis A chieved: Procedure was tolerated well  Procedure was tolerated well Procedure was tolerated well Debridement Treatment Response: 2.5x2x0.2 3x1.8x0.2 1.4x0.9x0.2 Post Debridement Measurements L x W x D (cm) 0.785 0.848 0.198 Post Debridement Volume: (cm) Debridement Debridement Debridement Procedures Performed: Treatment Notes Wound #2 (Hand - 2nd Digit) Wound Laterality: Right Cleanser Soap and Water Discharge Instruction: May shower and wash wound with dial antibacterial soap and water prior to dressing change. Peri-Wound Care Topical Primary Dressing Santyl Ointment Discharge Instruction: Apply nickel thick amount to wound bed as instructed Secondary Dressing Woven Gauze Sponges 2x2 in Discharge Instruction: Apply over primary dressing as directed. Secured With Conforming Stretch Gauze Bandage, Sterile 2x75 (in/in) Discharge Instruction: Secure with stretch gauze as directed. 7M Medipore H Soft Cloth Surgical T ape, 4 x 10 (in/yd) Discharge Instruction: Secure with tape as directed. Compression Wrap Compression Stockings Add-Ons Wound #3 (Hand - 3rd Digit) Wound Laterality: Left Cleanser Soap and Water Discharge Instruction: May shower and wash wound with dial antibacterial soap and water prior to dressing change. Peri-Wound Care Topical Primary Dressing Santyl Ointment Discharge Instruction: Apply nickel thick amount to wound bed as instructed Secondary Dressing Woven Gauze Sponges 2x2 in Discharge Instruction: Apply over primary dressing as directed. Secured With Conforming Stretch Gauze Bandage, Sterile 2x75 (in/in) Discharge Instruction: Secure with stretch gauze as directed. 7M Medipore H Soft Cloth Surgical T ape, 4 x 10 (in/yd) Discharge Instruction: Secure with tape as directed. Compression Wrap Compression Stockings Add-Ons Wound #4 (Hand - 1st Digit) Wound Laterality: Left Cleanser Soap and Water Discharge Instruction: May shower and wash wound with dial antibacterial soap and water prior  to dressing change. Peri-Wound Care Topical Primary Dressing Santyl Ointment Discharge Instruction: Apply nickel thick amount to wound bed as instructed Secondary Dressing Woven Gauze Sponges 2x2 in Discharge Instruction: Apply over primary dressing as directed. Secured With Conforming Stretch Gauze Bandage, Sterile 2x75 (in/in) Discharge Instruction: Secure with stretch gauze as directed. 7M Medipore H Soft Cloth Surgical T ape, 4 x 10 (in/yd) Discharge Instruction: Secure with tape as directed. Compression Wrap Compression Stockings Add-Ons Electronic Signature(s) Signed: 10/25/2021 4:20:15 PM By: Kalman Shan DO Signed: 10/25/2021 5:00:27 PM By: Lorrin Jackson Entered By: Kalman Shan on 10/25/2021 14:29:01 -------------------------------------------------------------------------------- Multi-Disciplinary Care Plan Details Patient Name: Date of Service: [place], EDWA RD W. 10/25/2021 1:15 PM Medical Record Number: [id number] Patient Account Number: [id number] Date of Birth/Sex: Treating RN: 1965/07/04 (56 y.o. [name] Primary Care Chimene Salo: Hayden Rasmussen Other Clinician: Referring Jayland Null: Treating Kadeja Granada/Extender: Cheri Guppy in Treatment: 0 Active Inactive Nutrition Nursing Diagnoses: Impaired glucose control: actual or potential Goals: Patient/caregiver verbalizes understanding of need to maintain therapeutic glucose control per primary care physician Date Initiated: 10/25/2021 Target Resolution Date: 11/22/2021 Goal Status: Active Interventions: Assess HgA1c results as ordered upon admission and as needed Assess patient nutrition upon admission and as needed per policy Provide education on elevated blood sugars and impact on wound  healing Notes: Wound/Skin Impairment Nursing Diagnoses: Impaired tissue integrity Goals: Patient/caregiver will verbalize understanding of skin care regimen Date Initiated:  10/25/2021 Target Resolution Date: 11/22/2021 Goal Status: Active Ulcer/skin breakdown will have a volume reduction of 30% by week 4 Date Initiated: 10/25/2021 Target Resolution Date: 11/22/2021 Goal Status: Active Interventions: Assess patient/caregiver ability to obtain necessary supplies Assess patient/caregiver ability to perform ulcer/skin care regimen upon admission and as needed Assess ulceration(s) every visit Provide education on ulcer and skin care Treatment Activities: Topical wound management initiated : 10/25/2021 Notes: Electronic Signature(s) Signed: 10/25/2021 5:00:27 PM By: Lorrin Jackson Entered By: Lorrin Jackson on 10/25/2021 13:55:54 -------------------------------------------------------------------------------- Pain Assessment Details Patient Name: Date of Service: [place], EDWA RD W. 10/25/2021 1:15 PM Medical Record Number: [id number] Patient Account Number: [id number] Date of Birth/Sex: Treating RN: 02/07/1966 (56 y.o. [name] Primary Care Egbert Seidel: Hayden Rasmussen Other Clinician: Referring Kha Hari: Treating Edmon Magid/Extender: Cheri Guppy in Treatment: 0 Active Problems Location of Pain Severity and Description of Pain Patient Has Paino No Site Locations Pain Management and Medication Current Pain Management: Electronic Signature(s) Signed: 10/25/2021 5:00:27 PM By: Lorrin Jackson Entered By: Lorrin Jackson on 10/25/2021 13:20:03 -------------------------------------------------------------------------------- Patient/Caregiver Education Details Patient Name: Date of Service: [place], EDWA RD W. 8/15/2023andnbsp1:15 PM Medical Record Number: [id number] Patient Account Number: [id number] Date of Birth/Gender: Treating RN: March 18, 1965 (56 y.o. [name] Primary Care Physician: Hayden Rasmussen Other Clinician: Referring Physician: Treating Physician/Extender: Cheri Guppy in  Treatment: 0 Education Assessment Education Provided To: Patient Education Topics Provided Elevated Blood Sugar/ Impact on Healing: Methods: Explain/Verbal, Printed Responses: State content correctly Wound/Skin Impairment: Methods: Explain/Verbal, Printed Responses: State content correctly Electronic Signature(s) Signed: 10/25/2021 5:00:27 PM By: Lorrin Jackson Entered By: Lorrin Jackson on 10/25/2021 14:05:41 -------------------------------------------------------------------------------- Wound Assessment Details Patient Name: Date of Service: [place], EDWA RD W. 10/25/2021 1:15 PM Medical Record Number: [id number] Patient Account Number: [id number] Date of Birth/Sex: Treating RN: 04-03-1965 (56 y.o. [name] Primary Care Eliette Drumwright: Hayden Rasmussen Other Clinician: Referring Bibi Economos: Treating Summers Buendia/Extender: Cheri Guppy in Treatment: 0 Wound Status Wound Number: 2 Primary 2nd degree Burn Etiology: Wound Location: Right Hand - 2nd Digit Wound Open Wounding Event: Thermal Burn Status: Date Acquired: 09/21/2021 Comorbid Anemia, Chronic Obstructive Pulmonary Disease (COPD), Sleep Weeks Of Treatment: 0 History: Apnea, Coronary Artery Disease, Hypertension, Type II Diabetes, Clustered Wound: No Rheumatoid Arthritis, Neuropathy, Seizure Disorder, Received Chemotherapy Photos Wound Measurements Length: (cm) 2.5 Width: (cm) 2 Depth: (cm) 0.2 Area: (cm) 3.927 Volume: (cm) 0.785 % Reduction in Area: 0% % Reduction in Volume: 0% Epithelialization: None Tunneling: No Undermining: No Wound Description Classification: Full Thickness Without Exposed Support Structures Exudate Amount: Medium Exudate Type: Serosanguineous Exudate Color: red, brown Foul Odor After Cleansing: No Slough/Fibrino Yes Wound Bed Granulation Amount: Small (1-33%) Necrotic Amount: Large (67-100%) Necrotic Quality: Eschar, Adherent Slough Treatment  Notes Wound #2 (Hand - 2nd Digit) Wound Laterality: Right Cleanser Soap and Water Discharge Instruction: May shower and wash wound with dial antibacterial soap and water prior to dressing change. Peri-Wound Care Topical Primary Dressing Santyl Ointment Discharge Instruction: Apply nickel thick amount to wound bed as instructed Secondary Dressing Woven Gauze Sponges 2x2 in Discharge Instruction: Apply over primary dressing as directed. Secured With Conforming Stretch Gauze Bandage, Sterile 2x75 (in/in) Discharge Instruction: Secure with stretch gauze as directed. 67M Medipore H Soft Cloth Surgical T ape, 4 x 10 (in/yd) Discharge Instruction: Secure with tape as directed. Compression Wrap Compression  Stockings Environmental education officer) Signed: 10/25/2021 4:48:52 PM By: Erenest Blank Signed: 10/25/2021 5:00:27 PM By: Lorrin Jackson Entered By: Erenest Blank on 10/25/2021 13:36:53 -------------------------------------------------------------------------------- Wound Assessment Details Patient Name: Date of Service: [place], EDWA RD W. 10/25/2021 1:15 PM Medical Record Number: [id number] Patient Account Number: [id number] Date of Birth/Sex: Treating RN: Mar 07, 1966 (56 y.o. [name] Primary Care Elisha Cooksey: Hayden Rasmussen Other Clinician: Referring Cidney Kirkwood: Treating Harolyn Cocker/Extender: Cheri Guppy in Treatment: 0 Wound Status Wound Number: 3 Primary 2nd degree Burn Etiology: Wound Location: Left Hand - 3rd Digit Wound Open Wounding Event: Thermal Burn Status: Date Acquired: 09/21/2021 Comorbid Anemia, Chronic Obstructive Pulmonary Disease (COPD), Sleep Weeks Of Treatment: 0 History: Apnea, Coronary Artery Disease, Hypertension, Type II Diabetes, Clustered Wound: No Rheumatoid Arthritis, Neuropathy, Seizure Disorder, Received Chemotherapy Photos Wound Measurements Length: (cm) 3 Width: (cm) 1.8 Depth: (cm) 0.23 Area: (cm)  4.241 Volume: (cm) 0.975 % Reduction in Area: % Reduction in Volume: Epithelialization: None Tunneling: No Undermining: No Wound Description Classification: Full Thickness Without Exposed Support Structures Exudate Amount: Medium Exudate Type: Serosanguineous Exudate Color: red, brown Foul Odor After Cleansing: No Slough/Fibrino Yes Wound Bed Granulation Amount: Medium (34-66%) Necrotic Amount: Medium (34-66%) Necrotic Quality: Eschar Treatment Notes Wound #3 (Hand - 3rd Digit) Wound Laterality: Left Cleanser Soap and Water Discharge Instruction: May shower and wash wound with dial antibacterial soap and water prior to dressing change. Peri-Wound Care Topical Primary Dressing Santyl Ointment Discharge Instruction: Apply nickel thick amount to wound bed as instructed Secondary Dressing Woven Gauze Sponges 2x2 in Discharge Instruction: Apply over primary dressing as directed. Secured With Conforming Stretch Gauze Bandage, Sterile 2x75 (in/in) Discharge Instruction: Secure with stretch gauze as directed. 65M Medipore H Soft Cloth Surgical T ape, 4 x 10 (in/yd) Discharge Instruction: Secure with tape as directed. Compression Wrap Compression Stockings Add-Ons Electronic Signature(s) Signed: 10/25/2021 4:48:52 PM By: Erenest Blank Signed: 10/25/2021 5:00:27 PM By: Lorrin Jackson Entered By: Erenest Blank on 10/25/2021 13:33:56 -------------------------------------------------------------------------------- Wound Assessment Details Patient Name: Date of Service: [place], EDWA RD W. 10/25/2021 1:15 PM Medical Record Number: [id number] Patient Account Number: [id number] Date of Birth/Sex: Treating RN: [birth year] (56 y.o. [name] Primary Care Chaniyah Jahr: Hayden Rasmussen Other Clinician: Referring Yazmyn Valbuena: Treating Ezrah Dembeck/Extender: Cheri Guppy in Treatment: 0 Wound Status Wound Number: 4 Primary 2nd degree Burn Etiology: Wound  Location: Left Hand - 1st Digit Wound Open Wounding Event: Thermal Burn Status: Date Acquired: 09/21/2021 Comorbid Anemia, Chronic Obstructive Pulmonary Disease (COPD), Sleep Weeks Of Treatment: 0 Weeks Of Treatment: 0 History: Apnea, Coronary Artery Disease, Hypertension, Type II Diabetes, Clustered Wound: No Rheumatoid Arthritis, Neuropathy, Seizure Disorder, Received Chemotherapy Photos Wound Measurements Length: (cm) 1.4 Width: (cm) 0.9 Depth: (cm) 0.2 Area: (cm) 0.99 Volume: (cm) 0.198 % Reduction in Area: 76.7% % Reduction in Volume: 76.7% Epithelialization: None Tunneling: No Undermining: No Wound Description Classification: Full Thickness Without Exposed Support Structures Exudate Amount: Medium Exudate Type: Serosanguineous Exudate Color: red, brown Foul Odor After Cleansing: No Slough/Fibrino Yes Wound Bed Granulation Amount: Medium (34-66%) Necrotic Amount: Medium (34-66%) Necrotic Quality: Eschar, Adherent Slough Treatment Notes Wound #4 (Hand - 1st Digit) Wound Laterality: Left Cleanser Soap and Water Discharge Instruction: May shower and wash wound with dial antibacterial soap and water prior to dressing change. Peri-Wound Care Topical Primary Dressing Santyl Ointment Discharge Instruction: Apply nickel thick amount to wound bed as instructed Secondary Dressing Woven Gauze Sponges 2x2 in Discharge Instruction: Apply over primary dressing as directed. Secured  With Conforming Stretch Gauze Bandage, Sterile 2x75 (in/in) Discharge Instruction: Secure with stretch gauze as directed. 28M Medipore H Soft Cloth Surgical T ape, 4 x 10 (in/yd) Discharge Instruction: Secure with tape as directed. Compression Wrap Compression Stockings Add-Ons Electronic Signature(s) Signed: 10/25/2021 4:48:52 PM By: Erenest Blank Signed: 10/25/2021 5:00:27 PM By: Lorrin Jackson Entered By: Erenest Blank on 10/25/2021  13:31:56 -------------------------------------------------------------------------------- Vitals Details Patient Name: Date of Service: [place], EDWA RD W. 10/25/2021 1:15 PM Medical Record Number: [id number] Patient Account Number: [id number] Date of Birth/Sex: Treating RN: 1965/04/02 (56 y.o. [name] Primary Care Alyxis Grippi: Hayden Rasmussen Other Clinician: Referring Kitana Gage: Treating Vallerie Hentz/Extender: Cheri Guppy in Treatment: 0 Vital Signs Time Taken: 13:16 Temperature (F): 98.3 Height (in): 75 Pulse (bpm): 84 Source: Stated Respiratory Rate (breaths/min): 16 Weight (lbs): 198 Blood Pressure (mmHg): 129/70 Source: Stated Capillary Blood Glucose (mg/dl): 278 Body Mass Index (BMI): 24.7 Reference Range: 80 - 120 mg / dl Electronic Signature(s) Signed: 10/25/2021 4:48:52 PM By: Erenest Blank Entered By: Erenest Blank on 10/25/2021 13:17:21

## 2021-10-26 NOTE — Progress Notes (Signed)
Vincent, [name] ([id number]) Visit Report for 10/25/2021 Abuse Risk Screen Details Patient Name: Date of Service: Vincent [name], Vincent RD W. 10/25/2021 1:15 PM Medical Record Number: [id number] Patient Account Number: [id number] Date of Birth/Sex: Treating RN: 1965-09-25 (56 y.o. [name] Primary Care Verleen Stuckey: Hayden Rasmussen Other Clinician: Referring Shanin Szymanowski: Treating Azizi Bally/Extender: Cheri Guppy in Treatment: 0 Abuse Risk Screen Items Answer ABUSE RISK SCREEN: Has anyone close to you tried to hurt or harm you recentlyo No Do you feel uncomfortable with anyone in your familyo No Has anyone forced you do things that you didnt want to doo No Electronic Signature(s) Signed: 10/25/2021 5:00:27 PM By: Lorrin Jackson Entered By: Lorrin Jackson on 10/25/2021 13:18:18 -------------------------------------------------------------------------------- Activities of Daily Living Details Patient Name: Date of Service: Vincent [name], Vincent RD W. 10/25/2021 1:15 PM Medical Record Number: [id number] Patient Account Number: [id number] Date of Birth/Sex: Treating RN: 1965/12/19 (56 y.o. [name] Primary Care Aliese Brannum: Hayden Rasmussen Other Clinician: Referring Lakhia Gengler: Treating Noele Icenhour/Extender: Cheri Guppy in Treatment: 0 Activities of Daily Living Items Answer Activities of Daily Living (Please select one for each item) Drive Automobile Completely Able T Medications ake Completely Able Use T elephone Completely Able Care for Appearance Completely Able Use T oilet Completely Able Bath / Shower Completely Able Dress Self Need Assistance Feed Self Completely Able Walk Completely Able Get In / Out Bed Completely Able Housework Completely Able Prepare Meals Completely [place] Completely Able Shop for Self Completely Able Electronic Signature(s) Signed: 10/25/2021 5:00:27 PM By: Lorrin Jackson Entered By: Lorrin Jackson on 10/25/2021 13:18:50 -------------------------------------------------------------------------------- Education Screening Details Patient Name: Date of Service: Vincent [name], Vincent RD W. 10/25/2021 1:15 PM Medical Record Number: [id number] Patient Account Number: [id number] Date of Birth/Sex: Treating RN: 10-May-1965 (56 y.o. [name] Primary Care Tanija Germani: Hayden Rasmussen Other Clinician: Referring Azzure Garabedian: Treating Henreitta Spittler/Extender: Cheri Guppy in Treatment: 0 Primary Learner Assessed: Patient Learning Preferences/Education Level/Primary Language Learning Preference: Explanation, Demonstration, Printed Material Highest Education Level: High School Preferred Language: English Cognitive Barrier Language Barrier: No Translator Needed: No Memory Deficit: No Emotional Barrier: No Cultural/Religious Beliefs Affecting Medical Care: No Physical Barrier Impaired Vision: No Impaired Hearing: Yes Hearing Aid Decreased Hand dexterity: No Knowledge/Comprehension Knowledge Level: High Comprehension Level: High Ability to understand written instructions: High Ability to understand verbal instructions: High Motivation Anxiety Level: Calm Cooperation: Cooperative Education Importance: Acknowledges Need Interest in Health Problems: Asks Questions Perception: Coherent Willingness to Engage in Self-Management High Activities: Readiness to Engage in Self-Management High Activities: Electronic Signature(s) Signed: 10/25/2021 4:48:52 PM By: Erenest Blank Signed: 10/25/2021 5:00:27 PM By: Lorrin Jackson Entered By: Erenest Blank on 10/25/2021 13:18:12 -------------------------------------------------------------------------------- Fall Risk Assessment Details Patient Name: Date of Service: Vincent [name], Vincent RD W. 10/25/2021 1:15 PM Medical Record Number: [id number] Patient Account Number: [id number] Date of Birth/Sex: Treating RN: 20-Dec-1965 (56 y.o. [name] Primary Care Bari Leib: Hayden Rasmussen Other Clinician: Referring Kijana Estock: Treating Evola Hollis/Extender: Cheri Guppy in Treatment: 0 Fall Risk Assessment Items Have you had 2 or more falls in the last 12 monthso 0 No Have you had any fall that resulted in injury in the last 12 monthso 0 No FALLS RISK SCREEN History of falling - immediate or within 3 months 0 No Secondary diagnosis (Do you have 2 or more medical diagnoseso) 15 Yes Ambulatory aid None/bed rest/wheelchair/nurse 0 Yes Crutches/cane/walker 0 No Furniture 0 No Intravenous therapy Access/Saline/Heparin Lock 0 No  Gait/Transferring Normal/ bed rest/ wheelchair 0 Yes Weak (short steps with or without shuffle, stooped but able to lift head while walking, may seek 0 No support from furniture) Impaired (short steps with shuffle, may have difficulty arising from chair, head down, impaired 0 No balance) Mental Status Oriented to own ability 0 Yes Electronic Signature(s) Signed: 10/25/2021 5:00:27 PM By: Lorrin Jackson Entered By: Lorrin Jackson on 10/25/2021 13:19:18 -------------------------------------------------------------------------------- Foot Assessment Details Patient Name: Date of Service: Vincent [name], Vincent RD W. 10/25/2021 1:15 PM Medical Record Number: [id number] Patient Account Number: [id number] Date of Birth/Sex: Treating RN: 24-Oct-1965 (56 y.o. [name] Primary Care Elizabeht Suto: Hayden Rasmussen Other Clinician: Referring Amori Colomb: Treating Fuad Forget/Extender: Cheri Guppy in Treatment: 0 Foot Assessment Items Site Locations + = Sensation present, - = Sensation absent, C = Callus, U = Ulcer R = Redness, W = Warmth, M = Maceration, PU = Pre-ulcerative lesion F = Fissure, S = Swelling, D = Dryness Assessment Right: Left: Other Deformity: No No Prior Foot Ulcer: No No Prior Amputation: No No Charcot Joint: No No Ambulatory  Status: Gait: Notes N/A: Finger Wound Electronic Signature(s) Signed: 10/25/2021 5:00:27 PM By: Lorrin Jackson Entered By: Lorrin Jackson on 10/25/2021 13:19:47 -------------------------------------------------------------------------------- Nutrition Risk Screening Details Patient Name: Date of Service: Vincent [name], Vincent RD W. 10/25/2021 1:15 PM Medical Record Number: [id number] Patient Account Number: [id number] Date of Birth/Sex: Treating RN: 01-Jul-1965 (56 y.o. [name] Primary Care Faizon Capozzi: Hayden Rasmussen Other Clinician: Referring Aydeen Blume: Treating Arlicia Paquette/Extender: Cheri Guppy in Treatment: 0 Height (in): 75 Weight (lbs): 198 Body Mass Index (BMI): 24.7 Nutrition Risk Screening Items Score Screening NUTRITION RISK SCREEN: I have an illness or condition that made me change the kind and/or amount of food I eat 0 No I eat fewer than two meals per day 0 No I eat few fruits and vegetables, or milk products 0 No I have three or more drinks of beer, liquor or wine almost every day 0 No I have tooth or mouth problems that make it hard for me to eat 0 No I don't always have enough money to buy the food I need 0 No I eat alone most of the time 0 No I take three or more different prescribed or over-the-counter drugs a day 1 Yes Without wanting to, I have lost or gained 10 pounds in the last six months 0 No I am not always physically able to shop, cook and/or feed myself 0 No Nutrition Protocols Good Risk Protocol 0 No interventions needed Moderate Risk Protocol High Risk Proctocol Risk Level: Good Risk Score: 1 Electronic Signature(s) Signed: 10/25/2021 5:00:27 PM By: Lorrin Jackson Entered By: Lorrin Jackson on 10/25/2021 13:19:29

## 2021-10-26 NOTE — Progress Notes (Addendum)
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 tightly adhered. At this time he would benefit from [facility] T help with further debridement. 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 Due to this he is high risk for this to recur. He expressed understanding. Follow-up in 1 week. Procedures Wound #2 Pre-procedure diagnosis of Wound #2 is a 2nd degree Burn located on the Right Hand - 2nd Digit . There was a Selective/Open Wound Non-Viable Tissue Debridement with a total area of 5 sq cm performed by Kalman Shan, DO. With the following instrument(s): Curette to remove Non-Viable tissue/material. Material removed includes Eschar after  achieving pain control using Lidocaine 4% Topical Solution. No specimens were taken. A time out was conducted at 13:49, prior to the start of the procedure. A Minimum amount of bleeding was controlled with Pressure. The procedure was tolerated well. Post Debridement Measurements: 2.5cm length x 2cm width x 0.2cm depth; 0.785cm^3 volume. Character of Wound/Ulcer Post Debridement is stable. Post procedure Diagnosis Wound #2: Same as Pre-Procedure General Notes: Procedure scribed for Dr. Heber [place] by Janan Halter, RN. Wound #3 Pre-procedure diagnosis of Wound #3 is a 2nd degree Burn located on the Left Hand - 3rd Digit . There was a Excisional Skin/Subcutaneous Tissue Debridement with a total area of 5.4 sq cm performed by Kalman Shan, DO. With the following instrument(s): Curette to remove Non-Viable tissue/material. Material removed includes Subcutaneous Tissue and Slough and after achieving pain control using Lidocaine 4% T opical Solution. No specimens were taken. A time out was conducted at 13:49, prior to the start of the procedure. A Minimum amount of bleeding was controlled with Pressure. The procedure was tolerated well. Post Debridement Measurements: 3cm length x 1.8cm width x 0.2cm depth; 0.848cm^3 volume. Character of Wound/Ulcer Post Debridement is stable. Post procedure Diagnosis Wound #3: Same as Pre-Procedure General Notes: Procedure scribed for Dr. Heber [place] by Janan Halter, RN. Wound #4 Pre-procedure diagnosis of Wound #4 is a 2nd degree Burn located on the Left Hand - 1st Digit . There was a Excisional Skin/Subcutaneous Tissue Debridement with a total area of 1.26 sq cm performed by Kalman Shan, DO. With the following instrument(s): Curette to remove Non-Viable tissue/material. Material removed includes Subcutaneous Tissue and Slough and after achieving pain control using Lidocaine 4% T opical Solution. No specimens were taken. A time out was conducted at 13:49, prior to the  start of the procedure. A Minimum amount of bleeding was controlled with Pressure. The procedure was tolerated well. Post Debridement Measurements: 1.4cm length x 0.9cm width x 0.2cm depth; 0.198cm^3 volume. Character of Wound/Ulcer Post Debridement is stable. Post procedure Diagnosis Wound #4: Same as Pre-Procedure General Notes: Procedure scribed for Dr. Heber [place] by Janan Halter, RN. Plan Follow-up Appointments: Return Appointment in 1 week. - 11/01/21 @ 10:15am with Dr. Heber [place] and Leveda Anna, RN (Room 7) Other: - Prescription for Santyl Ointment sent to pharmacy, call us if medication is too expensive. Bathing/ Shower/ Hygiene: May shower and wash wound with soap and water. Additional Orders / Instructions: Follow Nutritious Diet - Monitor/Control Blood Sugars The following medication(s) was prescribed: Santyl topical 250 unit/gram ointment  1 Apply daily to the wound beds starting 10/25/2021 WOUND #2: - Hand - 2nd Digit Wound Laterality: Right Cleanser: Soap and Water 1 x Per Day/30 Days Discharge Instructions: May shower and wash wound with dial antibacterial soap and water prior to dressing change. Prim Dressing: Santyl Ointment 1 x Per Day/30 Days ary Discharge Instructions: Apply nickel thick amount to wound bed as instructed Secondary Dressing: Woven Gauze Sponges 2x2 in (DME) (Generic) 1 x Per Day/30 Days Discharge Instructions: Apply over primary dressing as directed. Secured With: Child psychotherapist, Sterile 2x75 (in/in) (DME) (Generic) 1 x Per Day/30 Days Discharge Instructions: Secure with stretch gauze as directed. Secured With: 87M Medipore H Soft Cloth Surgical T ape, 4 x 10 (in/yd) (DME) (Generic) 1 x Per Day/30 Days Discharge Instructions: Secure with tape as directed. WOUND #3: - Hand - 3rd Digit Wound Laterality: Left Cleanser: Soap and Water 1 x Per Day/30 Days Discharge Instructions: May shower and wash wound with dial antibacterial soap and water prior to  dressing change. Prim Dressing: Santyl Ointment 1 x Per Day/30 Days ary Discharge Instructions: Apply nickel thick amount to wound bed as instructed Secondary Dressing: Woven Gauze Sponges 2x2 in (DME) (Generic) 1 x Per Day/30 Days Discharge Instructions: Apply over primary dressing as directed. Secured With: Child psychotherapist, Sterile 2x75 (in/in) (DME) (Generic) 1 x Per Day/30 Days Discharge Instructions: Secure with stretch gauze as directed. Secured With: 87M Medipore H Soft Cloth Surgical T ape, 4 x 10 (in/yd) (DME) (Generic) 1 x Per Day/30 Days Discharge Instructions: Secure with tape as directed. WOUND #4: - Hand - 1st Digit Wound Laterality: Left Cleanser: Soap and Water 1 x Per Day/30 Days Discharge Instructions: May shower and wash wound with dial antibacterial soap and water prior to dressing change. Prim Dressing: Santyl Ointment 1 x Per Day/30 Days ary Discharge Instructions: Apply nickel thick amount to wound bed as instructed Secondary Dressing: Woven Gauze Sponges 2x2 in (DME) (Generic) 1 x Per Day/30 Days Discharge Instructions: Apply over primary dressing as directed. Secured With: Child psychotherapist, Sterile 2x75 (in/in) (DME) (Generic) 1 x Per Day/30 Days Discharge Instructions: Secure with stretch gauze as directed. Secured With: 87M Medipore H Soft Cloth Surgical T ape, 4 x 10 (in/yd) (DME) (Generic) 1 x Per Day/30 Days Discharge Instructions: Secure with tape as directed. 1. In office sharp debridement 2. Santyl 3. Follow-up in 1 week Electronic Signature(s) Signed: 10/25/2021 4:20:15 PM By: Kalman Shan DO Entered By: Kalman Shan on 10/25/2021 14:43:23 -------------------------------------------------------------------------------- HxROS Details Patient Name: Date of Service: [place], EDWA RD W. 10/25/2021 1:15 PM Medical Record Number: [id number] Patient Account Number: [id number] Date of Birth/Sex: Treating RN: [birth year] (56  y.o. [name] Primary Care Provider: Hayden Rasmussen Other Clinician: Referring Provider: Treating Provider/Extender: Cheri Guppy in Treatment: 0 Information Obtained From Patient Eyes Complaints and Symptoms: Negative for: Dry Eyes; Vision Changes; Glasses / Contacts Ear/Nose/Mouth/Throat Complaints and Symptoms: Negative for: Chronic sinus problems or rhinitis Hematologic/Lymphatic Medical History: Positive for: Anemia Respiratory Medical History: Positive for: Chronic Obstructive Pulmonary Disease (COPD); Sleep Apnea Cardiovascular Medical History: Positive for: Coronary Artery Disease; Hypertension Past Medical History Notes: Hyperlipidemia-Cardiac Cath 2003 and 2011 Gastrointestinal Medical History: Past Medical History Notes: GERD, Hiatal hernia Endocrine Medical History: Positive for: Type II Diabetes - A1C 14.1 Past Medical History Notes: Hypothyroidism Time with diabetes: since 56 years old Treated with: Insulin, Oral agents Blood sugar tested every day: Yes T ested : 2  times a day Genitourinary Medical History: Past Medical History Notes: Hx bladder cancer Integumentary (Skin) Musculoskeletal Medical History: Positive for: Rheumatoid Arthritis Negative for: Osteoarthritis Past Medical History Notes: degenerative disc disease-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 finger Neurologic Medical History: Positive for: Neuropathy; Seizure Disorder - Epilepsy: last seizure 34mo Past Medical History Notes: TIA x 2 Oncologic Medical History: Positive for: Received Chemotherapy Negative for: Received Radiation Past Medical History Notes: Bladder cancer Psychiatric Complaints and Symptoms: Review of System Notes: Past history of suicide attempt Medical History: Past Medical History Notes: Bipolar Immunizations Pneumococcal Vaccine: Received Pneumococcal Vaccination: Yes Received Pneumococcal Vaccination On or After 60th  Birthday: No Implantable Devices None Family and Social History Cancer: No; Diabetes: Yes - Father,Paternal Grandparents; Heart Disease: Yes - Father,Paternal Grandparents; Hereditary Spherocytosis: No; Hypertension: Yes - Father,Paternal Grandparents; Kidney Disease: Yes - Father; Lung Disease: No; Seizures: No; Stroke: Yes - Paternal Grandparents,Father; Thyroid Problems: Yes - Father,Siblings; Current every day smoker - 2 packs per day; Marital Status - Married; Alcohol Use: Never; Drug Use: No History; Caffeine Use: Rarely; Financial Concerns: No; Food, Clothing or Shelter Needs: No; Support System Lacking: No; Transportation Concerns: No Electronic Signature(s) Signed: 10/25/2021 4:20:15 PM By: HKalman ShanDO Signed: 10/25/2021 5:00:27 PM By: BLorrin JacksonEntered By: BLorrin Jacksonon 10/25/2021 13:18:03 -------------------------------------------------------------------------------- SuperBill Details Patient Name: [place] of Service: [place] W. 10/25/2021 Medical Record Number: [id number] Account Number: [id number] of Birth/Sex: Treating RN: 424-Jan-1967(56y.o. MMarcheta GrammesPrimary Care Provider: RHayden RasmussenOther Clinician: Referring Provider: Treating Provider/Extender: HCheri Guppyin Treatment: 0 Diagnosis Coding ICD-10 Codes Code Description [id number] 2 diabetes mellitus with other skin ulcer T23.292A Burn of second degree of multiple sites of left wrist and hand, initial encounter T23.201A Burn of second degree of right hand, unspecified site, initial encounter L98.492 Non-pressure chronic ulcer of skin of other sites with fat layer exposed S61.200A Unspecified open wound of right index finger without damage to nail, initial encounter S61.203A Unspecified open wound of left middle finger without damage to nail, initial encounter S61.002A Unspecified open wound of left thumb without damage to nail, initial  encounter Facility Procedures CPT4 Code: [id number]: [id number]- WOUND CARE VISIT-LEV 2 EST PT Modifier: 25 Quantity: 1 CPT4 Code: [id number]: 11042 - DEB SUBQ TISSUE 20 SQ CM/< ICD-10 Diagnosis Description L98.492 Non-pressure chronic ulcer of skin of other sites with fat layer exposed S61.203A Unspecified open wound of left middle finger without damage to nail, initial encount  S61.002A Unspecified open wound of left thumb without damage to nail, initial encounter Modifier: er Quantity: 1 CPT4 Code: [id number]: 97597 - DEBRIDE WOUND 1ST 20 SQ CM OR < ICD-10 Diagnosis Description L98.492 Non-pressure chronic ulcer of skin of other sites with fat layer exposed S61.200A Unspecified open wound of right index finger without damage to nail, initial  encount Modifier: er Quantity: 1 Physician Procedures : CPT4 Code Description Modifier [id number] - WC PHYS LEVEL 4 - EST PT ICD-10 Diagnosis Description E11.622 Type 2 diabetes mellitus with other skin ulcer T23.292A Burn of second degree of multiple sites of left wrist and hand, initial encounter  T23.201A Burn of second degree of right hand, unspecified site, initial encounter L98.492 Non-pressure chronic ulcer of skin of other sites with fat layer exposed Quantity: 1 : [id number] - WC PHYS SUBQ TISS 20 SQ CM ICD-10 Diagnosis Description L98.492 Non-pressure chronic ulcer of skin of other sites with fat layer exposed S61.203A Unspecified open  wound of left middle finger without damage to nail, initial encounter  S61.002A Unspecified open wound of left thumb without damage to nail, initial encounter Quantity: 1 : [id number] - WC PHYS DEBR WO ANESTH 20 SQ CM ICD-10 Diagnosis Description L98.492 Non-pressure chronic ulcer of skin of other sites with fat layer exposed S61.200A Unspecified open wound of right index finger without damage to nail, initial  encounter Quantity: 1 Electronic Signature(s) Signed:  10/31/2021 5:07:45 PM By: Kalman Shan DO Previous Signature: 10/25/2021 4:20:15 PM Version By: Kalman Shan DO Entered By: Kalman Shan on 10/31/2021 17:07:10

## 2021-10-27 ENCOUNTER — Ambulatory Visit: Payer: Medicare Other | Admitting: Neurology

## 2021-11-01 ENCOUNTER — Encounter (HOSPITAL_BASED_OUTPATIENT_CLINIC_OR_DEPARTMENT_OTHER): Payer: No Typology Code available for payment source | Admitting: Internal Medicine

## 2021-11-03 ENCOUNTER — Encounter (HOSPITAL_BASED_OUTPATIENT_CLINIC_OR_DEPARTMENT_OTHER): Payer: No Typology Code available for payment source | Admitting: Internal Medicine

## 2021-11-08 ENCOUNTER — Encounter (HOSPITAL_BASED_OUTPATIENT_CLINIC_OR_DEPARTMENT_OTHER): Payer: No Typology Code available for payment source | Attending: Internal Medicine | Admitting: Internal Medicine

## 2021-11-08 DIAGNOSIS — S61203A Unspecified open wound of left middle finger without damage to nail, initial encounter: Secondary | ICD-10-CM | POA: Insufficient documentation

## 2021-11-08 DIAGNOSIS — S91302A Unspecified open wound, left foot, initial encounter: Secondary | ICD-10-CM | POA: Diagnosis not present

## 2021-11-08 DIAGNOSIS — X58XXXA Exposure to other specified factors, initial encounter: Secondary | ICD-10-CM | POA: Diagnosis not present

## 2021-11-08 DIAGNOSIS — S61002A Unspecified open wound of left thumb without damage to nail, initial encounter: Secondary | ICD-10-CM | POA: Insufficient documentation

## 2021-11-08 DIAGNOSIS — L98492 Non-pressure chronic ulcer of skin of other sites with fat layer exposed: Secondary | ICD-10-CM | POA: Diagnosis not present

## 2021-11-08 DIAGNOSIS — F4310 Post-traumatic stress disorder, unspecified: Secondary | ICD-10-CM | POA: Diagnosis not present

## 2021-11-08 DIAGNOSIS — E1165 Type 2 diabetes mellitus with hyperglycemia: Secondary | ICD-10-CM | POA: Diagnosis not present

## 2021-11-08 DIAGNOSIS — T23201A Burn of second degree of right hand, unspecified site, initial encounter: Secondary | ICD-10-CM | POA: Insufficient documentation

## 2021-11-08 DIAGNOSIS — E11622 Type 2 diabetes mellitus with other skin ulcer: Secondary | ICD-10-CM | POA: Insufficient documentation

## 2021-11-08 DIAGNOSIS — S61200A Unspecified open wound of right index finger without damage to nail, initial encounter: Secondary | ICD-10-CM | POA: Insufficient documentation

## 2021-11-08 DIAGNOSIS — F3181 Bipolar II disorder: Secondary | ICD-10-CM | POA: Diagnosis not present

## 2021-11-08 DIAGNOSIS — E11621 Type 2 diabetes mellitus with foot ulcer: Secondary | ICD-10-CM | POA: Diagnosis not present

## 2021-11-08 DIAGNOSIS — E1140 Type 2 diabetes mellitus with diabetic neuropathy, unspecified: Secondary | ICD-10-CM | POA: Diagnosis not present

## 2021-11-08 DIAGNOSIS — J449 Chronic obstructive pulmonary disease, unspecified: Secondary | ICD-10-CM | POA: Diagnosis not present

## 2021-11-08 DIAGNOSIS — T23292A Burn of second degree of multiple sites of left wrist and hand, initial encounter: Secondary | ICD-10-CM | POA: Diagnosis not present

## 2021-11-08 DIAGNOSIS — I10 Essential (primary) hypertension: Secondary | ICD-10-CM | POA: Diagnosis not present

## 2021-11-08 NOTE — Progress Notes (Addendum)
Vincent, [name] ([id number]) [id number] Page 1 of 11 Visit Report for 11/08/2021 Arrival Information Details Patient Name: Date of Service: Vincent [name], EDWA RD W. 11/08/2021 9:30 A M Medical Record Number: [id number] Patient Account Number: [id number] Date of Birth/Sex: Treating RN: 06-Dec-1965 (56 y.o. M) Primary Care Daryle Amis: Hayden Rasmussen Other Clinician: Referring Sameria Morss: Treating Shadeed Colberg/Extender: Cheri Guppy in Treatment: 2 Visit Information History Since Last Visit Added or deleted any medications: No Patient Arrived: Ambulatory Any new allergies or adverse reactions: No Arrival Time: 09:33 Had a fall or experienced change in No Accompanied By: self activities of daily living that may affect Transfer Assistance: None risk of falls: Patient Identification Verified: Yes Signs or symptoms of abuse/neglect since last visito No Secondary Verification Process Completed: Yes Hospitalized since last visit: No Patient Requires Transmission-Based Precautions: No Implantable device outside of the clinic excluding No Patient Has Alerts: No cellular tissue based products placed in the center since last visit: Has Dressing in Place as Prescribed: Yes Pain Present Now: No Electronic Signature(s) Signed: 11/08/2021 3:53:04 PM By: Erenest Blank Entered By: Erenest Blank on 11/08/2021 09:33:52 -------------------------------------------------------------------------------- Encounter Discharge Information Details Patient Name: Date of Service: Vincent [name], EDWA RD W. 11/08/2021 9:30 A M Medical Record Number: [id number] Patient Account Number: [id number] Date of Birth/Sex: Treating RN: March 01, 1966 (56 y.o. [name] Primary Care Jhanae Jaskowiak: Hayden Rasmussen Other Clinician: Referring [name]: Treating Marine Lezotte/Extender: Cheri Guppy in Treatment: 2 Encounter Discharge Information Items Post Procedure  Vitals Discharge Condition: Stable Temperature (F): 97.9 Ambulatory Status: Ambulatory Pulse (bpm): 79 Discharge Destination: Home Respiratory Rate (breaths/min): 16 Transportation: Private Auto Blood Pressure (mmHg): 166/79 Schedule Follow-up Appointment: Yes Clinical Summary of Care: Provided on 11/08/2021 Form Type Recipient Paper Patient Patient Electronic Signature(s) Signed: 11/08/2021 4:34:50 PM By: Lorrin Jackson Entered By: Lorrin Jackson on 11/08/2021 10:52:48 [name] ([id number]) [id number] Page 2 of 11 -------------------------------------------------------------------------------- Lower Extremity Assessment Details Patient Name: Date of Service: Vincent [name], EDWA RD W. 11/08/2021 9:30 A M Medical Record Number: [id number] Patient Account Number: [id number] Date of Birth/Sex: Treating RN: Aug 03, 1965 (56 y.o. M) Primary Care Jonisha Kindig: Hayden Rasmussen Other Clinician: Referring Adana Marik: Treating Ikeem Cleckler/Extender: Cheri Guppy in Treatment: 2 Edema Assessment Assessed: Shirlyn Goltz: Yes] Patrice Paradise: No] [Left: Edema] [Right: :] Vascular Assessment Pulses: Dorsalis Pedis Palpable: [Left:Yes] Blood Pressure: Brachial: [Left:166] Ankle: [Left:Dorsalis Pedis: 162 0.98] Electronic Signature(s) Signed: 11/08/2021 4:34:50 PM By: Lorrin Jackson Entered By: Lorrin Jackson on 11/08/2021 10:40:07 -------------------------------------------------------------------------------- Multi Wound Chart Details Patient Name: Date of Service: Vincent [name], EDWA RD W. 11/08/2021 9:30 A M Medical Record Number: [id number] Patient Account Number: [id number] Date of Birth/Sex: Treating RN: 13-Nov-1965 (56 y.o. M) Primary Care [name]: Hayden Rasmussen Other Clinician: Referring Reda Gettis: Treating Felicie Kocher/Extender: Cheri Guppy in Treatment: 2 Vital Signs Height(in): 75 Capillary Blood Glucose(mg/dl): 241 Weight(lbs):  198 Pulse(bpm): 79 Body Mass Index(BMI): 24.7 Blood Pressure(mmHg): 166/79 Temperature(F): 97.9 Respiratory Rate(breaths/min): 16 [2:Photos:] Right Hand - 2nd Digit Left Hand - 3rd Digit Left Hand - 1st Digit Wound Location: Thermal Burn Thermal Burn Thermal Burn Wounding Event: 2nd degree Burn 2nd degree Burn 2nd degree Burn Primary Etiology: Anemia, Chronic Obstructive Anemia, Chronic Obstructive Anemia, Chronic Obstructive Comorbid History: Pulmonary Disease (COPD), Sleep Pulmonary Disease (COPD), Sleep Pulmonary Disease (COPD), Sleep [name], Vincent [name] ([id number]) [id number] Page 3 of 11 Apnea, Coronary Artery Disease, Apnea, Coronary Artery Disease, Apnea, Coronary Artery Disease, Hypertension, Type II Diabetes, Hypertension, Type II Diabetes, Hypertension, Type II  Diabetes, Rheumatoid Arthritis, Neuropathy, Rheumatoid Arthritis, Neuropathy, Rheumatoid Arthritis, Neuropathy, Seizure Disorder, Received Seizure Disorder, Received Seizure Disorder, Received Chemotherapy Chemotherapy Chemotherapy 09/21/2021 09/21/2021 09/21/2021 Date Acquired: '2 2 2 '$ Weeks of Treatment: Open Open Open Wound Status: No No No Wound Recurrence: 2x0.7x0.2 0.7x0.6x0.2 0.3x0.2x0.1 Measurements L x W x D (cm) 1.1 0.33 0.047 A (cm) : rea 0.22 0.066 0.005 Volume (cm) : 72.00% 92.20% 95.30% % Reduction in Area: 72.00% 93.20% 97.50% % Reduction in Volume: Full Thickness Without Exposed Full Thickness Without Exposed Full Thickness Without Exposed Classification: Support Structures Support Structures Support Structures Medium Medium Medium Exudate A mount: Serosanguineous Serosanguineous Serosanguineous Exudate Type: red, brown red, brown red, brown Exudate Color: Yes No No Foul Odor A Cleansing: fter No N/A N/A Odor A nticipated Due to Product Use: Small (1-33%) Medium (34-66%) Medium (34-66%) Granulation A mount: Large (67-100%) Medium (34-66%) Medium  (34-66%) Necrotic A mount: Eschar, Adherent Slough Eschar Eschar, Adherent Slough Necrotic Tissue: None None None Epithelialization: N/A N/A N/A Debridement: N/A N/A N/A Pain Control: N/A N/A N/A Tissue Debrided: N/A N/A N/A Level: N/A N/A N/A Debridement A (sq cm): rea N/A N/A N/A Instrument: N/A N/A N/A Bleeding: N/A N/A N/A Hemostasis A chieved: N/A N/A N/A Debridement Treatment Response: N/A N/A N/A Post Debridement Measurements L x W x D (cm) N/A N/A N/A Post Debridement Volume: (cm) Dressings and/or debridement of Dressings and/or debridement of N/A Procedures Performed: burns; small burns; small Wound Number: 5 N/A N/A Photos: N/A N/A Left, Lateral Foot N/A N/A Wound Location: Gradually Appeared N/A N/A Wounding Event: T be determined o N/A N/A Primary Etiology: Anemia, Chronic Obstructive N/A N/A Comorbid History: Pulmonary Disease (COPD), Sleep Apnea, Coronary Artery Disease, Hypertension, Type II Diabetes, Rheumatoid Arthritis, Neuropathy, Seizure Disorder, Received Chemotherapy 11/03/2021 N/A N/A Date Acquired: 0 N/A N/A Weeks of Treatment: Open N/A N/A Wound Status: No N/A N/A Wound Recurrence: 1.7x0.8x0.1 N/A N/A Measurements L x W x D (cm) 1.068 N/A N/A A (cm) : rea 0.107 N/A N/A Volume (cm) : 0.00% N/A N/A % Reduction in Area: 0.00% N/A N/A % Reduction in Volume: Full Thickness Without Exposed N/A N/A Classification: Support Structures Medium N/A N/A Exudate A mount: Serosanguineous N/A N/A Exudate Type: red, brown N/A N/A Exudate Color: No N/A N/A Foul Odor A Cleansing: fter N/A N/A N/A Odor Anticipated Due to Product Use: None Present (0%) N/A N/A Granulation A mount: Large (67-100%) N/A N/A Necrotic Amount: Eschar N/A N/A Necrotic Tissue: Fascia: No N/A N/A Exposed Structures: Fat Layer (Subcutaneous Tissue): No Tendon: No Muscle: No DAMARIAN, PRIOLA ([id number]) [id number]  Page 4 of 11 Joint: No Bone: No None N/A N/A Epithelialization: Debridement - Excisional N/A N/A Debridement: Pre-procedure Verification/Time Out 10:20 N/A N/A Taken: Lidocaine 4% Topical Solution N/A N/A Pain Control: Necrotic/Eschar, Subcutaneous, N/A N/A Tissue Debrided: Slough Skin/Subcutaneous Tissue N/A N/A Level: 1.36 N/A N/A Debridement A (sq cm): rea Curette N/A N/A Instrument: Minimum N/A N/A Bleeding: Pressure N/A N/A Hemostasis Achieved: Debridement Treatment Response: Procedure was tolerated well N/A N/A Post Debridement Measurements L x 1.7x0.8x0.3 N/A N/A W x D (cm) 0.32 N/A N/A Post Debridement Volume: (cm) Debridement N/A N/A Procedures Performed: Treatment Notes Wound #2 (Hand - 2nd Digit) Wound Laterality: Right Cleanser Soap and Water Discharge Instruction: May shower and wash wound with dial antibacterial soap and water prior to dressing change. Peri-Wound Care Topical Primary Dressing Santyl Ointment Discharge Instruction: Apply nickel thick amount to wound bed as instructed Secondary Dressing Woven Gauze Sponges 2x2 in  Discharge Instruction: Apply over primary dressing as directed. Secured With Conforming Stretch Gauze Bandage, Sterile 2x75 (in/in) Discharge Instruction: Secure with stretch gauze as directed. 79M Medipore H Soft Cloth Surgical T ape, 4 x 10 (in/yd) Discharge Instruction: Secure with tape as directed. Compression Wrap Compression Stockings Add-Ons Wound #3 (Hand - 3rd Digit) Wound Laterality: Left Cleanser Soap and Water Discharge Instruction: May shower and wash wound with dial antibacterial soap and water prior to dressing change. Peri-Wound Care Topical Primary Dressing Santyl Ointment Discharge Instruction: Apply nickel thick amount to wound bed as instructed Secondary Dressing Woven Gauze Sponges 2x2 in Discharge Instruction: Apply over primary dressing as directed. Secured With Conforming Stretch Gauze  Bandage, Sterile 2x75 (in/in) Discharge Instruction: Secure with stretch gauze as directed. 79M Medipore H Soft Cloth Surgical T ape, 4 x 10 (in/yd) Discharge Instruction: Secure with tape as directed. Compression [address] EULICE, RUTLEDGE ([id number]) [id number] Page 5 of 11 Compression Stockings Add-Ons Wound #4 (Hand - 1st Digit) Wound Laterality: Left Cleanser Soap and Water Discharge Instruction: May shower and wash wound with dial antibacterial soap and water prior to dressing change. Peri-Wound Care Topical Primary Dressing Santyl Ointment Discharge Instruction: Apply nickel thick amount to wound bed as instructed Secondary Dressing Woven Gauze Sponges 2x2 in Discharge Instruction: Apply over primary dressing as directed. Secured With Conforming Stretch Gauze Bandage, Sterile 2x75 (in/in) Discharge Instruction: Secure with stretch gauze as directed. 79M Medipore H Soft Cloth Surgical T ape, 4 x 10 (in/yd) Discharge Instruction: Secure with tape as directed. Compression Wrap Compression Stockings Add-Ons Wound #5 (Foot) Wound Laterality: Left, Lateral Cleanser Soap and Water Discharge Instruction: May shower and wash wound with dial antibacterial soap and water prior to dressing change. Peri-Wound Care Topical Primary Dressing Santyl Ointment Discharge Instruction: Apply nickel thick amount to wound bed as instructed Secondary Dressing Woven Gauze Sponges 2x2 in Discharge Instruction: Apply over primary dressing as directed. Secured With Conforming Stretch Gauze Bandage, Sterile 2x75 (in/in) Discharge Instruction: Secure with stretch gauze as directed. 79M Medipore H Soft Cloth Surgical T ape, 4 x 10 (in/yd) Discharge Instruction: Secure with tape as directed. Compression Wrap Compression Stockings Add-Ons Electronic Signature(s) Signed: 11/08/2021 12:41:14 PM By: Kalman Shan DO Entered By: Kalman Shan on 11/08/2021 11:59:06 [name] ([id number]) [id number] Page 6 of 11 -------------------------------------------------------------------------------- Multi-Disciplinary Care Plan Details Patient Name: Date of Service: [place], EDWA RD W. 11/08/2021 9:30 A M Medical Record Number: [id number] Patient Account Number: [id number] Date of Birth/Sex: Treating RN: 03/27/1965 (56 y.o. [name] Primary Care Glinda Natzke: Hayden Rasmussen Other Clinician: Referring Iyana Topor: Treating Symia Herdt/Extender: Cheri Guppy in Treatment: 2 Active Inactive Electronic Signature(s) Signed: 01/02/2022 5:38:30 PM By: Deon Pilling RN, BSN Signed: 01/06/2022 1:21:55 PM By: Lorrin Jackson Previous Signature: 11/08/2021 10:17:32 AM Version By: Lorrin Jackson Entered By: Deon Pilling on 01/02/2022 17:38:29 -------------------------------------------------------------------------------- Pain Assessment Details Patient Name: Date of Service: [place], EDWA RD W. 11/08/2021 9:30 A M Medical Record Number: [id number] Patient Account Number: [id number] Date of Birth/Sex: Treating RN: [birth year] (56 y.o. M) Primary Care Sumit Branham: Hayden Rasmussen Other Clinician: Referring Nicolaus Andel: Treating Kao Berkheimer/Extender: Cheri Guppy in Treatment: 2 Active Problems Location of Pain Severity and Description of Pain Patient Has Paino No Site Locations Pain Management and Medication Current Pain Management: Electronic Signature(s) Signed: 11/08/2021 3:53:04 PM By: Erenest Blank Entered By: Erenest Blank on 11/08/2021 09:34:23 [name] ([id number]) [id number] Page 7 of 11 -------------------------------------------------------------------------------- Patient/Caregiver Education Details Patient Name: Date  of Service: [place], EDWA RD W. 8/29/2023andnbsp9:30 A M Medical Record Number: [id number] Patient Account Number: [id number] Date of  Birth/Gender: Treating RN: 10-20-1965 (56 y.o. [name] Primary Care Physician: Hayden Rasmussen Other Clinician: Referring Physician: Treating Physician/Extender: Cheri Guppy in Treatment: 2 Education Assessment Education Provided To: Patient Education Topics Provided Elevated Blood Sugar/ Impact on Healing: Methods: Explain/Verbal, Printed Responses: State content correctly Wound/Skin Impairment: Methods: Explain/Verbal, Printed Responses: State content correctly Electronic Signature(s) Signed: 11/08/2021 4:34:50 PM By: Lorrin Jackson Entered By: Lorrin Jackson on 11/08/2021 10:17:52 -------------------------------------------------------------------------------- Wound Assessment Details Patient Name: Date of Service: [place], EDWA RD W. 11/08/2021 9:30 A M Medical Record Number: [id number] Patient Account Number: [id number] Date of Birth/Sex: Treating RN: 23-Aug-1965 (56 y.o. M) Primary Care Kanon Colunga: Hayden Rasmussen Other Clinician: Referring Havanna Groner: Treating Luria Rosario/Extender: Cheri Guppy in Treatment: 2 Wound Status Wound Number: 2 Primary 2nd degree Burn Etiology: Wound Location: Right Hand - 2nd Digit Wound Open Wounding Event: Thermal Burn Status: Date Acquired: 09/21/2021 Comorbid Anemia, Chronic Obstructive Pulmonary Disease (COPD), Sleep Weeks Of Treatment: 2 History: Apnea, Coronary Artery Disease, Hypertension, Type II Diabetes, Clustered Wound: No Rheumatoid Arthritis, Neuropathy, Seizure Disorder, Received Chemotherapy Photos GURINDER, TORAL ([id number]) [phone] Page 8 of 11 Wound Measurements Length: (cm) 2 Width: (cm) 0.7 Depth: (cm) 0.2 Area: (cm) 1.1 Volume: (cm) 0.22 % Reduction in Area: 72% % Reduction in Volume: 72% Epithelialization: None Tunneling: No Undermining: No Wound Description Classification: Full Thickness Without Exposed  Support Exudate Amount: Medium Exudate Type: Serosanguineous Exudate Color: red, brown Structures Foul Odor After Cleansing: Yes Due to Product Use: No Slough/Fibrino Yes Wound Bed Granulation Amount: Small (1-33%) Necrotic Amount: Large (67-100%) Necrotic Quality: Eschar, Adherent Therapist, music) Signed: 11/08/2021 3:53:04 PM By: Erenest Blank Entered By: Erenest Blank on 11/08/2021 09:51:44 -------------------------------------------------------------------------------- Wound Assessment Details Patient Name: Date of Service: [place], EDWA RD W. 11/08/2021 9:30 A M Medical Record Number: [id number] Patient Account Number: [id number] Date of Birth/Sex: Treating RN: May 10, 1965 (56 y.o. M) Primary Care Shaaron Golliday: Hayden Rasmussen Other Clinician: Referring Emberlie Gotcher: Treating Deshayla Empson/Extender: Cheri Guppy in Treatment: 2 Wound Status Wound Number: 3 Primary 2nd degree Burn Etiology: Wound Location: Left Hand - 3rd Digit Wound Open Wounding Event: Thermal Burn Status: Date Acquired: 09/21/2021 Comorbid Anemia, Chronic Obstructive Pulmonary Disease (COPD), Sleep Weeks Of Treatment: 2 History: Apnea, Coronary Artery Disease, Hypertension, Type II Diabetes, Clustered Wound: No Rheumatoid Arthritis, Neuropathy, Seizure Disorder, Received Chemotherapy Photos NAIF, ALABI ([id number]) [id number].pdf Page 9 of 11 Wound Measurements Length: (cm) 0.7 Width: (cm) 0.6 Depth: (cm) 0.2 Area: (cm) 0.33 Volume: (cm) 0.066 % Reduction in Area: 92.2% % Reduction in Volume: 93.2% Epithelialization: None Wound Description Classification: Full Thickness Without Exposed Support Exudate Amount: Medium Exudate Type: Serosanguineous Exudate Color: red, brown Structures Foul Odor After Cleansing: No Slough/Fibrino Yes Wound Bed Granulation Amount: Medium (34-66%) Necrotic Amount: Medium (34-66%) Necrotic Quality:  Eschar Electronic Signature(s) Signed: 11/08/2021 3:53:04 PM By: Erenest Blank Entered By: Erenest Blank on 11/08/2021 09:52:15 -------------------------------------------------------------------------------- Wound Assessment Details Patient Name: Date of Service: [place], EDWA RD W. 11/08/2021 9:30 A M Medical Record Number: [id number] Patient Account Number: [id number] Date of Birth/Sex: Treating RN: 08/31/1965 (56 y.o. M) Primary Care Nikoloz Huy: Hayden Rasmussen Other Clinician: Referring Blasa Raisch: Treating Haydin Calandra/Extender: Cheri Guppy in Treatment: 2 Wound Status Wound Number: 4 Primary 2nd degree Burn Etiology: Wound Location: Left Hand - 1st Digit Wound Open Wounding Event:  Thermal Burn Status: Date Acquired: 09/21/2021 Comorbid Anemia, Chronic Obstructive Pulmonary Disease (COPD), Sleep Weeks Of Treatment: 2 History: Apnea, Coronary Artery Disease, Hypertension, Type II Diabetes, Clustered Wound: No Rheumatoid Arthritis, Neuropathy, Seizure Disorder, Received Chemotherapy Photos TRAYVOND, VIETS ([id number]) [phone].pdf Page 10 of 11 Wound Measurements Length: (cm) 0.3 Width: (cm) 0.2 Depth: (cm) 0.1 Area: (cm) 0.047 Volume: (cm) 0.005 % Reduction in Area: 95.3% % Reduction in Volume: 97.5% Epithelialization: None Tunneling: No Undermining: No Wound Description Classification: Full Thickness Without Exposed Support Exudate Amount: Medium Exudate Type: Serosanguineous Exudate Color: red, brown Structures Foul Odor After Cleansing: No Slough/Fibrino Yes Wound Bed Granulation Amount: Medium (34-66%) Necrotic Amount: Medium (34-66%) Necrotic Quality: Eschar, Adherent Slough Electronic Signature(s) Signed: 11/08/2021 3:53:04 PM By: Erenest Blank Entered By: Erenest Blank on 11/08/2021 09:52:52 -------------------------------------------------------------------------------- Wound Assessment Details Patient  Name: Date of Service: [place], EDWA RD W. 11/08/2021 9:30 A M Medical Record Number: [id number] Patient Account Number: [id number] Date of Birth/Sex: Treating RN: [birth year] (56 y.o. M) Primary Care Britanie Harshman: Hayden Rasmussen Other Clinician: Referring Kayla Deshaies: Treating Iridiana Fonner/Extender: Cheri Guppy in Treatment: 2 Wound Status Wound Number: 5 Primary T be determined o Etiology: Wound Location: Left, Lateral Foot Wound Open Wounding Event: Gradually Appeared Status: Date Acquired: 11/03/2021 Comorbid Anemia, Chronic Obstructive Pulmonary Disease (COPD), Sleep Weeks Of Treatment: 0 History: Apnea, Coronary Artery Disease, Hypertension, Type II Diabetes, Clustered Wound: No Rheumatoid Arthritis, Neuropathy, Seizure Disorder, Received Chemotherapy Photos NICHOLAUS, STEINKE ([id number]) [id number] Page 11 of 11 Wound Measurements Length: (cm) 1.7 Width: (cm) 0.8 Depth: (cm) 0.1 Area: (cm) 1.068 Volume: (cm) 0.107 % Reduction in Area: 0% % Reduction in Volume: 0% Epithelialization: None Tunneling: No Undermining: No Wound Description Classification: Full Thickness Without Exposed Support Exudate Amount: Medium Exudate Type: Serosanguineous Exudate Color: red, brown Structures Foul Odor After Cleansing: No Slough/Fibrino Yes Wound Bed Granulation Amount: None Present (0%) Exposed Structure Necrotic Amount: Large (67-100%) Fascia Exposed: No Necrotic Quality: Eschar Fat Layer (Subcutaneous Tissue) Exposed: No Tendon Exposed: No Muscle Exposed: No Joint Exposed: No Bone Exposed: No Electronic Signature(s) Signed: 11/08/2021 3:53:04 PM By: Erenest Blank Entered By: Erenest Blank on 11/08/2021 09:53:20 -------------------------------------------------------------------------------- Vitals Details Patient Name: Date of Service: [place], EDWA RD W. 11/08/2021 9:30 A M Medical Record Number: [id number] Patient Account  Number: [id number] Date of Birth/Sex: Treating RN: 18-Nov-1965 (56 y.o. M) Primary Care Kandis Henry: Hayden Rasmussen Other Clinician: Referring Nickolaus Bordelon: Treating Laquetta Racey/Extender: Cheri Guppy in Treatment: 2 Vital Signs Time Taken: 09:33 Temperature (F): 97.9 Height (in): 75 Pulse (bpm): 79 Weight (lbs): 198 Respiratory Rate (breaths/min): 16 Body Mass Index (BMI): 24.7 Blood Pressure (mmHg): 166/79 Capillary Blood Glucose (mg/dl): 241 Reference Range: 80 - 120 mg / dl Electronic Signature(s) Signed: 11/08/2021 3:53:04 PM By: Erenest Blank Entered By: Erenest Blank on 11/08/2021 09:34:17

## 2021-11-08 NOTE — Progress Notes (Signed)
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 procedure. A Minimum amount of bleeding was controlled with Pressure. The procedure was tolerated well. Post Debridement Measurements: 1.7cm length x 0.8cm width x 0.3cm depth; 0.32cm^3 volume. Character of Wound/Ulcer Post Debridement is stable. Severity of Tissue Post Debridement is: Fat layer exposed. Post procedure Diagnosis Wound #5: Same as Pre-Procedure Wound #2 Pre-procedure diagnosis of Wound #2 is a 2nd degree Burn located on the Right Hand - 2nd Digit . An Dressings and/or debridement of burns; small procedure was performed by Kalman Shan, DO. Post procedure Diagnosis Wound #2: Same as Pre-Procedure Wound  #3 Pre-procedure diagnosis of Wound #3 is a 2nd degree Burn located on the Left Hand - 3rd Digit . An Dressings and/or debridement of burns; small procedure was performed by Kalman Shan, DO. Post procedure Diagnosis Wound #3: Same as Pre-Procedure Plan Follow-up Appointments: Return Appointment in 1 week. - 11/17/21 @ 9:30am with Dr. Heber Ivor and Leveda Anna, RN (Room 7) Bathing/ Shower/ Hygiene: May shower and wash wound with soap and water. Additional Orders / Instructions: Follow Nutritious Diet - Monitor/Control Blood Sugars WOUND #2: - Hand - 2nd Digit Wound Laterality: Right Cleanser: Soap and Water 1 x Per Day/30 Days Discharge Instructions: May shower and wash wound with dial antibacterial soap and water prior to dressing change. Prim Dressing: Santyl Ointment 1 x Per Day/30 Days ary Discharge Instructions: Apply nickel thick amount to wound bed as instructed Secondary Dressing: Woven Gauze Sponges 2x2 in (Generic) 1 x Per Day/30 Days Discharge Instructions: Apply over primary dressing as directed. Secured With: Child psychotherapist, Sterile 2x75 (in/in) (Generic) 1 x Per Day/30 Days Discharge Instructions: Secure with stretch gauze as directed. Secured With: 45M Medipore H Soft Cloth Surgical T ape, 4 x 10 (in/yd) (Generic) 1 x Per Day/30 Days Discharge Instructions: Secure with tape as directed. WOUND #3: - Hand - 3rd Digit Wound Laterality: Left Cleanser: Soap and Water 1 x Per Day/30 Days Discharge Instructions: May shower and wash wound with dial antibacterial soap and water prior to dressing change. Prim Dressing: Santyl Ointment 1 x Per Day/30 Days ary Discharge Instructions: Apply nickel thick amount to wound bed as instructed Secondary Dressing: Woven Gauze Sponges 2x2 in (Generic) 1 x Per Day/30 Days Discharge Instructions: Apply over primary dressing as directed. Secured With: Child psychotherapist, Sterile 2x75 (in/in) (Generic) 1 x Per Day/30  Days Discharge Instructions: Secure with stretch gauze as directed. Secured With: 45M Medipore H Soft Cloth Surgical T ape, 4 x 10 (in/yd) (Generic) 1 x Per Day/30 Days Discharge Instructions: Secure with tape as directed. WOUND #4: - Hand - 1st Digit Wound Laterality: Left Cleanser: Soap and Water 1 x Per Day/30 Days Discharge Instructions: May shower and wash wound with dial  antibacterial soap and water prior to dressing change. Prim Dressing: Santyl Ointment 1 x Per Day/30 Days ary Discharge Instructions: Apply nickel thick amount to wound bed as instructed Secondary Dressing: Woven Gauze Sponges 2x2 in (Generic) 1 x Per Day/30 Days Discharge Instructions: Apply over primary dressing as directed. Secured With: Child psychotherapist, Sterile 2x75 (in/in) (Generic) 1 x Per Day/30 Days Discharge Instructions: Secure with stretch gauze as directed. Secured With: 58M Medipore H Soft Cloth Surgical T ape, 4 x 10 (in/yd) (Generic) 1 x Per Day/30 Days Discharge Instructions: Secure with tape as directed. WOUND #5: - Foot Wound Laterality: Left, Lateral Cleanser: Soap and Water 1 x Per Day/30 Days Discharge Instructions: May shower and wash wound with dial antibacterial soap and water prior to dressing change. Prim Dressing: Santyl Ointment 1 x Per Day/30 Days ary Discharge Instructions: Apply nickel thick amount to wound bed as instructed Secondary Dressing: Woven Gauze Sponges 2x2 in (Generic) 1 x Per Day/30 Days Discharge Instructions: Apply over primary dressing as directed. Secured With: Child psychotherapist, Sterile 2x75 (in/in) (Generic) 1 x Per Day/30 Days Discharge Instructions: Secure with stretch gauze as directed. Secured With: 58M Medipore H Soft Cloth Surgical T ape, 4 x 10 (in/yd) (Generic) 1 x Per Day/30 Days Discharge Instructions: Secure with tape as directed. 1. In office sharp debridement 2. Santyl 3. Follow-up in 1 week Electronic  Signature(s) Signed: 11/08/2021 12:41:14 PM By: Kalman Shan DO Entered By: Kalman Shan on 11/08/2021 12:04:22 -------------------------------------------------------------------------------- HxROS Details Patient Name: Date of Service: [place], EDWA RD W. 11/08/2021 9:30 A M Medical Record Number: [id number] Patient Account Number: [id number] Date of Birth/Sex: Treating RN: 06-25-1965 (56 y.o. M) Primary Care Provider: Hayden Rasmussen Other Clinician: Referring Provider: Treating Provider/Extender: Cheri Guppy in Treatment: 2 Information Obtained From Patient Hematologic/Lymphatic Medical History: Positive for: Anemia Respiratory Medical History: Positive for: Chronic Obstructive Pulmonary Disease (COPD); Sleep Apnea Cardiovascular Medical History: Positive for: Coronary Artery Disease; Hypertension Past Medical History Notes: Hyperlipidemia-Cardiac Cath 2003 and 2011 Gastrointestinal Medical History: Past Medical History Notes: GERD, Hiatal hernia Endocrine Medical History: Positive for: Type II Diabetes - A1C 14.1 Past Medical History Notes: Hypothyroidism Time with diabetes: since 56 years old Treated with: Insulin, Oral agents Blood sugar tested every day: Yes T ested : 2 times a day Genitourinary Medical History: Past Medical History Notes: Hx bladder cancer Musculoskeletal Medical History: Positive for: Rheumatoid Arthritis Negative for: Osteoarthritis Past Medical History Notes: degenerative disc disease-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 finger Neurologic Medical History: Positive for: Neuropathy; Seizure Disorder - Epilepsy: last seizure 13mo Past Medical History Notes: TIA x 2 Oncologic Medical History: Positive for: Received Chemotherapy Negative for: Received Radiation Past Medical History Notes: Bladder cancer Psychiatric Medical History: Past Medical History Notes: Bipolar Immunizations Pneumococcal  Vaccine: Received Pneumococcal Vaccination: Yes Received Pneumococcal Vaccination On or After 60th Birthday: No Implantable Devices None Family and Social History Cancer: No; Diabetes: Yes - Father,Paternal Grandparents; Heart Disease: Yes - Father,Paternal Grandparents; Hereditary Spherocytosis: No; Hypertension: Yes - Father,Paternal Grandparents; Kidney Disease: Yes - Father; Lung Disease: No; Seizures: No; Stroke: Yes - Paternal Grandparents,Father; Thyroid Problems: Yes - Father,Siblings; Current every day smoker - 2 packs per day; Marital Status - Married; Alcohol Use: Never; Drug Use: No History; Caffeine Use: Rarely; Financial Concerns: No; Food, Clothing or Shelter Needs: No; Support System Lacking: No; Transportation Concerns: No Electronic Signature(s) Signed: 11/08/2021 12:41:14 PM By: HKalman ShanDO Entered By: HKalman Shanon 11/08/2021 12:00:09 -------------------------------------------------------------------------------- SuperBill  Details Patient Name: Date of Service: [place], EDWA RD W. 11/08/2021 Medical Record Number: [id number] Patient Account Number: [id number] Date of Birth/Sex: Treating RN: [birth year] (56 y.o. [name] Primary Care Provider: Hayden Rasmussen Other Clinician: Referring Provider: Treating Provider/Extender: Cheri Guppy in Treatment: 2 Diagnosis Coding ICD-10 Codes Code Description [phone] Type 2 diabetes mellitus with other skin ulcer T23.292A Burn of second degree of multiple sites of left wrist and hand, initial encounter T23.201A Burn of second degree of right hand, unspecified site, initial encounter L98.492 Non-pressure chronic ulcer of skin of other sites with fat layer exposed S61.200A Unspecified open wound of right index finger without damage to nail, initial encounter S61.203A Unspecified open wound of left middle finger without damage to nail, initial encounter S61.002A Unspecified open  wound of left thumb without damage to nail, initial encounter S91.302A Unspecified open wound, left foot, initial encounter E11.621 Type 2 diabetes mellitus with foot ulcer Facility Procedures CPT4 Code: [id number] Description: 11042 - DEB SUBQ TISSUE 20 SQ CM/< ICD-10 Diagnosis Description S61.200A Unspecified open wound of right index finger without damage to nail, initial e R00.762U Unspecified open wound of left middle finger without damage to nail, initial e  S91.302A Unspecified open wound, left foot, initial encounter Modifier: ncounter ncounter Quantity: 1 Physician Procedures : CPT4 Code Description Modifier [id number] - WC PHYS LEVEL 3 - EST PT ICD-10 Diagnosis Description S91.302A Unspecified open wound, left foot, initial encounter E11.621 Type 2 diabetes mellitus with foot ulcer Quantity: 1 : [id number] - WC PHYS SUBQ TISS 20 SQ CM ICD-10 Diagnosis Description S61.200A Unspecified open wound of right index finger without damage to nail, initial encounter S61.203A Unspecified open wound of left middle finger without damage to nail,  initial encounter S91.302A Unspecified open wound, left foot, initial encounter Quantity: 1 Electronic Signature(s) Signed: 11/08/2021 12:41:14 PM By: Kalman Shan DO Entered By: Kalman Shan on 11/08/2021 12:06:43

## 2021-11-17 ENCOUNTER — Encounter (HOSPITAL_BASED_OUTPATIENT_CLINIC_OR_DEPARTMENT_OTHER): Payer: No Typology Code available for payment source | Attending: Internal Medicine | Admitting: Internal Medicine

## 2021-12-19 ENCOUNTER — Ambulatory Visit (INDEPENDENT_AMBULATORY_CARE_PROVIDER_SITE_OTHER): Payer: Medicare HMO | Admitting: Podiatry

## 2021-12-19 ENCOUNTER — Emergency Department (HOSPITAL_BASED_OUTPATIENT_CLINIC_OR_DEPARTMENT_OTHER)
Admission: EM | Admit: 2021-12-19 | Discharge: 2021-12-19 | Disposition: A | Discharge status: Home / Self Care | Payer: No Typology Code available for payment source | Attending: Emergency Medicine | Admitting: Emergency Medicine

## 2021-12-19 ENCOUNTER — Encounter (HOSPITAL_BASED_OUTPATIENT_CLINIC_OR_DEPARTMENT_OTHER): Payer: Self-pay | Admitting: *Deleted

## 2021-12-19 ENCOUNTER — Other Ambulatory Visit: Payer: Self-pay

## 2021-12-19 DIAGNOSIS — E039 Hypothyroidism, unspecified: Secondary | ICD-10-CM | POA: Insufficient documentation

## 2021-12-19 DIAGNOSIS — R739 Hyperglycemia, unspecified: Secondary | ICD-10-CM

## 2021-12-19 DIAGNOSIS — Z7982 Long term (current) use of aspirin: Secondary | ICD-10-CM | POA: Diagnosis not present

## 2021-12-19 DIAGNOSIS — E1165 Type 2 diabetes mellitus with hyperglycemia: Secondary | ICD-10-CM | POA: Diagnosis not present

## 2021-12-19 DIAGNOSIS — Z7984 Long term (current) use of oral hypoglycemic drugs: Secondary | ICD-10-CM | POA: Insufficient documentation

## 2021-12-19 DIAGNOSIS — L02414 Cutaneous abscess of left upper limb: Secondary | ICD-10-CM | POA: Insufficient documentation

## 2021-12-19 DIAGNOSIS — Z91199 Patient's noncompliance with other medical treatment and regimen due to unspecified reason: Secondary | ICD-10-CM

## 2021-12-19 DIAGNOSIS — J449 Chronic obstructive pulmonary disease, unspecified: Secondary | ICD-10-CM | POA: Diagnosis not present

## 2021-12-19 DIAGNOSIS — Z79899 Other long term (current) drug therapy: Secondary | ICD-10-CM | POA: Insufficient documentation

## 2021-12-19 DIAGNOSIS — L0291 Cutaneous abscess, unspecified: Secondary | ICD-10-CM

## 2021-12-19 LAB — BASIC METABOLIC PANEL
Anion gap: 10 (ref 5–15)
BUN: 15 mg/dL (ref 6–20)
CO2: 26 mmol/L (ref 22–32)
Calcium: 9.5 mg/dL (ref 8.9–10.3)
Chloride: 94 mmol/L — ABNORMAL LOW (ref 98–111)
Creatinine, Ser: 1.31 mg/dL — ABNORMAL HIGH (ref 0.61–1.24)
GFR, Estimated: >60 mL/min (ref >60)
Glucose, Bld: 589 mg/dL (ref 70–99)
Potassium: 4.5 mmol/L (ref 3.5–5.1)
Sodium: 130 mmol/L — ABNORMAL LOW (ref 135–145)

## 2021-12-19 LAB — CBC
HCT: 42.4 % (ref 39.0–52.0)
Hemoglobin: 14.8 g/dL (ref 13.0–17.0)
MCH: 28.2 pg (ref 26.0–34.0)
MCHC: 34.9 g/dL (ref 30.0–36.0)
MCV: 80.9 fL (ref 80.0–100.0)
Platelets: 286 10*3/uL (ref 150–400)
RBC: 5.24 MIL/uL (ref 4.22–5.81)
RDW: 12.3 % (ref 11.5–15.5)
WBC: 10.6 10*3/uL — ABNORMAL HIGH (ref 4.0–10.5)
nRBC: 0 % (ref 0.0–0.2)

## 2021-12-19 LAB — CBG MONITORING, ED: Glucose-Capillary: 541 mg/dL (ref 70–99)

## 2021-12-19 MED ORDER — LIDOCAINE-EPINEPHRINE (PF) 2 %-1:200000 IJ SOLN
10.0000 mL | Freq: Once | INTRAMUSCULAR | Status: AC
  Administered 2021-12-19: 10 mL
  Filled 2021-12-19: qty 20

## 2021-12-19 MED ORDER — DOXYCYCLINE HYCLATE 100 MG PO CAPS: 100.0000 mg | ORAL_CAPSULE | Freq: Two times a day (BID) | ORAL | 0 refills | Status: DC

## 2021-12-19 NOTE — ED Notes (Signed)
Pt reports CBG has been 200-300 and he did not take insulin today

## 2021-12-19 NOTE — Discharge Instructions (Signed)
It is okay to take a shower and wash area with soap and water.  It will drain for the next multiple days.  You can take Tylenol as needed for pain.  Make sure you take your insulin when you get home today.  If there are any problems or you feel like it is getting worse or you start running a fever you should return to the emergency room.  Your antibiotic was sent to your pharmacy.

## 2021-12-19 NOTE — ED Triage Notes (Signed)
Pt is here for abscess on left shoulder at the lase of his neck that began as a "small pimple" 4 days ago but has grown.  No fever or chills with this.  Pt had similar issue on his hip in the past and had it drained and took antibiotics

## 2021-12-19 NOTE — ED Notes (Signed)
Patient verbalizes understanding of discharge instructions. Opportunity for questioning and answers were provided. Patient discharged from ED.  °

## 2021-12-19 NOTE — ED Provider Notes (Signed)
[facility] EMERGENCY DEPT Provider Note   CSN: [id number] Arrival date & time: 12/19/21  1535     History  Chief Complaint  Patient presents with   Recurrent Skin Infections   Hyperglycemia    Vincent [name] is a 56 y.o. male.  Patient is a 55 year old male with a history of COPD, GERD, hypothyroidism, hyperlipidemia, chronic pain syndrome, gastric ulcers, diabetes, RA who is presenting today with complaint of an abscess on his left shoulder.  Its been there approximately 4 days and is gradually getting worse.  He reports it first started like a little pimple and he was rubbing it but it has gradually worsened.  It has not drained anything.  The history is provided by the patient.  Hyperglycemia      Home Medications Prior to Admission medications   Medication Sig Start Date End Date Taking? Authorizing Provider  doxycycline (VIBRAMYCIN) 100 MG capsule Take 1 capsule (100 mg total) by mouth 2 (two) times daily. 12/19/21  Yes Blanchie Dessert, MD  acetaminophen (TYLENOL) 500 MG tablet Take 1,000 mg by mouth every 6 (six) hours as needed for moderate pain or headache.    [provider]  aspirin EC 81 MG EC tablet Take 1 tablet (81 mg total) by mouth daily. Swallow whole. Patient taking differently: Take 81 mg by mouth at bedtime. Swallow whole. 11/16/20   Patrecia Pour, MD  clopidogrel (PLAVIX) 75 MG tablet Take 1 tablet (75 mg total) by mouth daily. 10/12/21   Suzzanne Cloud, NP  DULoxetine (CYMBALTA) 60 MG capsule Take 1 capsule (60 mg total) by mouth daily. 10/12/21   Suzzanne Cloud, NP  fenofibrate 160 MG tablet Take 160 mg by mouth daily. 12/31/19   [provider]  HUMALOG KWIKPEN 200 UNIT/ML KwikPen Inject 15 Units into the skin in the morning and at bedtime. 06/17/21   [provider]  lamoTRIgine (LAMICTAL) 150 MG tablet Take 1 tablet (150 mg total) by mouth 2 (two) times daily. 10/12/21   Suzzanne Cloud, NP  Lurasidone HCl (LATUDA) 60 MG  TABS Take 1 tablet (60 mg total) by mouth at bedtime. 10/12/21   Arfeen, Arlyce Harman, MD  metFORMIN (GLUCOPHAGE-XR) 500 MG 24 hr tablet Take 500 mg by mouth 2 (two) times daily. 08/24/18   [provider]  omeprazole (PRILOSEC) 40 MG capsule Take 40 mg by mouth daily. 08/23/21   [provider]  pregabalin (LYRICA) 225 MG capsule Take 1 capsule (225 mg total) by mouth 2 (two) times daily. 10/12/21   Suzzanne Cloud, NP  rosuvastatin (CRESTOR) 10 MG tablet Take 1 tablet (10 mg total) by mouth daily. 08/04/21 09/03/21  Briant Cedar, MD  tamsulosin (FLOMAX) 0.4 MG CAPS capsule Take 0.8 mg by mouth at bedtime.    [provider]  testosterone cypionate (DEPOTESTOSTERONE CYPIONATE) 200 MG/ML injection Inject 200 mg into the skin every 14 (fourteen) days. 07/03/21   [provider]  traZODone (DESYREL) 100 MG tablet Take 1 tablet (100 mg total) by mouth at bedtime as needed for sleep. 10/12/21   Arfeen, Arlyce Harman, MD  venlafaxine XR (EFFEXOR-XR) 150 MG 24 hr capsule Take 1 capsule (150 mg total) by mouth daily with breakfast. 10/12/21   Arfeen, Arlyce Harman, MD  Vitamin D, Ergocalciferol, (DRISDOL) 1.25 MG (50000 UNIT) CAPS capsule Take 50,000 Units by mouth every Wednesday. 12/26/19   [provider]      Allergies    Celebrex [celecoxib], Hydrocodone, and Sulfa  antibiotics    Review of Systems   Review of Systems  Physical Exam Updated Vital Signs BP (!) 148/67 (BP Location: Right Arm)   Pulse 77   Temp 97.8 F (36.6 C) (Oral)   Resp 18   SpO2 97%  Physical Exam Vitals and nursing note reviewed.  Constitutional:      General: He is not in acute distress.    Appearance: Normal appearance. He is normal weight.  Cardiovascular:     Rate and Rhythm: Normal rate.  Pulmonary:     Effort: Pulmonary effort is normal.  Musculoskeletal:        General: No swelling.       Arms:  Skin:    General: Skin is warm and dry.  Neurological:     Mental Status: He is alert  and oriented to person, place, and time. Mental status is at baseline.  Psychiatric:        Mood and Affect: Mood normal.     ED Results / Procedures / Treatments   Labs (all labs ordered are listed, but only abnormal results are displayed) Labs Reviewed  BASIC METABOLIC PANEL - Abnormal; Notable for the following components:      Result Value   Sodium 130 (*)    Chloride 94 (*)    Glucose, Bld 589 (*)    Creatinine, Ser 1.31 (*)    All other components within normal limits  CBC - Abnormal; Notable for the following components:   WBC 10.6 (*)    All other components within normal limits  CBG MONITORING, ED - Abnormal; Notable for the following components:   Glucose-Capillary 541 (*)    All other components within normal limits  URINALYSIS, ROUTINE W REFLEX MICROSCOPIC  CBG MONITORING, ED    EKG None  Radiology No results found.  Procedures Procedures   INCISION AND DRAINAGE Performed by: Blanchie Dessert Consent: Verbal consent obtained. Risks and benefits: risks, benefits and alternatives were discussed Type: abscess  Body area: left posterior shoulder  Anesthesia: local infiltration  Incision was made with a scalpel.  Local anesthetic: lidocaine 2% with epinephrine  Anesthetic total: 5 ml  Complexity: complex Blunt dissection to break up loculations  Drainage: purulent  Drainage amount: 75m  Packing material: none  Patient tolerance: Patient tolerated the procedure well with no immediate complications.   Medications Ordered in ED Medications  lidocaine-EPINEPHrine (XYLOCAINE W/EPI) 2 %-1:200000 (PF) injection 10 mL (10 mLs Infiltration Given 12/19/21 1631)    ED Course/ Medical Decision Making/ A&P                           Medical Decision Making Amount and/or Complexity of Data Reviewed External Data Reviewed: notes. Labs: ordered. Decision-making details documented in ED Course.  Risk Prescription drug management.   Patient  presenting today with an abscess.  He has no systemic symptoms or symptoms concerning for sepsis.  Abscess is on the shoulder and drainage as above.  Patient had labs drawn unknown why when he arrived.  I independently interpreted patient's labs.  It does show that patient is hyperglycemic on fingerstick which is 500.  CBC with minimal leukocytosis of 10, CMP does show hyperglycemia but no other acute findings.  Patient reports he woke up late did not take his insulin today but is having no symptoms concerning for DKA.  CMP without anion gap.  Feel is reasonable for patient to be discharged home now that abscess is  drained.  He will be given doxycycline and he will take his insulin when he gets home.  Patient is aware of his blood sugar and is fine managing it once he arrives at home.        Final Clinical Impression(s) / ED Diagnoses Final diagnoses:  Abscess  Hyperglycemia    Rx / DC Orders ED Discharge Orders          Ordered    doxycycline (VIBRAMYCIN) 100 MG capsule  2 times daily        12/19/21 1756              Blanchie Dessert, MD 12/19/21 1758

## 2021-12-20 NOTE — Progress Notes (Signed)
1. No-show for appointment    Seen in ED at [facility]. No charge.

## 2022-01-11 ENCOUNTER — Telehealth (HOSPITAL_COMMUNITY): Payer: No Typology Code available for payment source | Admitting: Psychiatry

## 2022-01-25 ENCOUNTER — Encounter (HOSPITAL_COMMUNITY): Payer: Self-pay | Admitting: Psychiatry

## 2022-01-25 ENCOUNTER — Telehealth (HOSPITAL_BASED_OUTPATIENT_CLINIC_OR_DEPARTMENT_OTHER): Payer: No Typology Code available for payment source | Admitting: Psychiatry

## 2022-01-25 VITALS — Wt 196.0 lb

## 2022-01-25 DIAGNOSIS — F4312 Post-traumatic stress disorder, chronic: Secondary | ICD-10-CM

## 2022-01-25 DIAGNOSIS — F3181 Bipolar II disorder: Secondary | ICD-10-CM | POA: Diagnosis not present

## 2022-01-25 DIAGNOSIS — R251 Tremor, unspecified: Secondary | ICD-10-CM

## 2022-01-25 MED ORDER — VENLAFAXINE HCL ER 150 MG PO CP24: 150.0000 mg | ORAL_CAPSULE | Freq: Every day | ORAL | 0 refills | Status: DC

## 2022-01-25 MED ORDER — TRAZODONE HCL 150 MG PO TABS: 150.0000 mg | ORAL_TABLET | Freq: Every evening | ORAL | 0 refills | Status: DC | PRN

## 2022-01-25 MED ORDER — LURASIDONE HCL 80 MG PO TABS: 80.0000 mg | ORAL_TABLET | Freq: Every day | ORAL | 0 refills | Status: DC

## 2022-01-25 NOTE — Progress Notes (Signed)
Virtual Visit via Telephone Note  I connected with Vincent [name] on 01/25/22 at  1:40 PM EST by telephone and verified that I am speaking with the correct person using two identifiers.  Location: Patient: Home Provider: Home Office   I discussed the limitations, risks, security and privacy concerns of performing an evaluation and management service by telephone and the availability of in person appointments. I also discussed with the patient that there may be a patient responsible charge related to this service. The patient expressed understanding and agreed to proceed.   History of Present Illness: Patient is evaluated by phone session.  He reported increased depression and anxiety and started to have hallucination and talking to himself.  He reported going through divorce and that has been difficult.  He also reported holidays are coming and has been feeling lonely.  His parents died around holidays and he missing them.  He reported more sad, irritable, having mood swings.  He has been separated from her wife for past few months.  His daughter comes and help to manage his medication.  His plan is to attend the Thanksgiving with his close family friends.  He was seen in the emergency room in October with very high blood sugar.  He is not sure why his blood sugar was so high but it was about 500.  Since then he has seen his PCP Dr. Monica Martinez for medication management.  He told his last hemoglobin A1c was much better and around 8.7 before it was above 11.  He also reported not taking the Cymbalta because pharmacy did not provide the prescription.  I saw the prescription it has multiple refills remaining.  It is unclear why the pharmacy did not provide Cymbalta.  He is getting Lamictal which is also prescribed by his PCP.  He does work as needed basis with his brother-in-law but not consistent.  [facility]  He reported sometimes feeling hopeless and severe mood swings and mood is  upset and irritated.  However he denies any active or passive suicidal thoughts or homicidal thoughts.  He denies any violence or aggression.  We have recommended to see the neurology but he told the neurologist did not help and he does not want to go back to see them.  He admitted occasional drinking and the last drink was 3 months ago.  Denies any binge or any intoxication.   Past Psychiatric History: Reviewed. H/O one brief hospitalization at [facility] due to suicidal thoughts.  No h/o suicidal attempt.  H/O anger, anxiety, PTSD and heavy substance use.  Claims to be sober for while. Tried Depakote, Zoloft, lithium, Prozac and Xanax. [name] helped but stopped due to polypharmacy.   Recent Results (from the past 2160 hour(s))  CBG monitoring, ED     Status: Abnormal   Collection Time: 12/19/21  3:56 PM  Result Value Ref Range   Glucose-Capillary 541 (HH) 70 - 99 mg/dL    Comment: Glucose reference range applies only to samples taken after fasting for at least 8 hours.  Basic metabolic panel     Status: Abnormal   Collection Time: 12/19/21  4:04 PM  Result Value Ref Range   Sodium 130 (L) 135 - 145 mmol/L   Potassium 4.5 3.5 - 5.1 mmol/L   Chloride 94 (L) 98 - 111 mmol/L   CO2 26 22 - 32 mmol/L   Glucose, Bld 589 (HH) 70 - 99 mg/dL    Comment: Glucose reference range  applies only to samples taken after fasting for at least 8 hours. CRITICAL RESULT CALLED TO, READ BACK BY AND VERIFIED WITH: M FOUNTAIN,RN 1650 12/19/2021 DBRADLEY    BUN 15 6 - 20 mg/dL   Creatinine, Ser 1.31 (H) 0.61 - 1.24 mg/dL   Calcium 9.5 8.9 - 10.3 mg/dL   GFR, Estimated >60 >60 mL/min    Comment: (NOTE) Calculated using the CKD-EPI Creatinine Equation (2021)    Anion gap 10 5 - 15    Comment: Performed at [facility], [address] [place], [place] [address]  CBC     Status: Abnormal   Collection Time: 12/19/21  4:04 PM  Result Value Ref Range   WBC 10.6 (H) 4.0 - 10.5 K/uL    RBC 5.24 4.22 - 5.81 MIL/uL   Hemoglobin 14.8 13.0 - 17.0 g/dL   HCT 42.4 39.0 - 52.0 %   MCV 80.9 80.0 - 100.0 fL   MCH 28.2 26.0 - 34.0 pg   MCHC 34.9 30.0 - 36.0 g/dL   RDW 12.3 11.5 - 15.5 %   Platelets 286 150 - 400 K/uL   nRBC 0.0 0.0 - 0.2 %    Comment: Performed at [facility], [address] [place], [place] [address]      Psychiatric Specialty Exam: Physical Exam  Review of Systems  Weight 196 lb (88.9 kg).There is no height or weight on file to calculate BMI.  General Appearance: NA  Eye Contact:  NA  Speech:  Slow  Volume:  Decreased  Mood:  Depressed, Dysphoric, and Hopeless  Affect:  NA  Thought Process:  Descriptions of Associations: Intact  Orientation:  Full (Time, Place, and Person)  Thought Content:  Hallucinations: Auditory Talking to self and hear family members  Suicidal Thoughts:  No  Homicidal Thoughts:  No  Memory:  Immediate;   Fair Recent;   Fair Remote;   Fair  Judgement:  Fair  Insight:  Shallow  Psychomotor Activity:  Tremor  Concentration:  Concentration: Fair and Attention Span: Fair  Recall:  [employer] of Knowledge:  Fair  Language:  Fair  Akathisia:  No  Handed:  Right  AIMS (if indicated):     Assets:  Communication Skills Desire for Improvement Housing  ADL's:  Intact  Cognition:  WNL  Sleep:   frequent awakening      Assessment and Plan: Bipolar disorder type II.  PTSD.  Tremors.  I reviewed blood work results from recent ER visit.  His blood sugar was above 550.  He is seeing ECP and now he feels his blood sugar is better.  He reported last hemoglobin A1c was 8.7.  He sees Dr. Danise Mina at [facility].  We will get the records from his PCP.  He does not want to see neurology because he does not feel they help his tremors.  His tremor is stable but now he is having a lot of irritability, mood swing, depression and poor sleep.  I encourage should contact the pharmacy to get the Cymbalta  which he has been out for the past few weeks.  It is unclear why the Cymbalta was not given because he has refill remaining from his PCP.  I recommend to try increasing trazodone 150 mg at bedtime, increase Latuda from 60 mg to 80 mg.  Continue Effexor 150 mg daily.  He is getting Lyrica, Lamictal and morphine from other providers.  I discussed he is taking multiple medication and should  avoid drinking alcohol and patient replied that he does not drink regularly and his last drink was 3 months ago.  He had a support from his daughter and his close friends.  I encourage therapy but patient is not interested at this time.  Discussed safety concern that anytime having active suicidal thoughts or homicidal thoughts need to call 911 or go to local emergency room.  Follow-up in 4 weeks.  Follow Up Instructions:    I discussed the assessment and treatment plan with the patient. The patient was provided an opportunity to ask questions and all were answered. The patient agreed with the plan and demonstrated an understanding of the instructions.   The patient was advised to call back or seek an in-person evaluation if the symptoms worsen or if the condition fails to improve as anticipated.  Collaboration of Care: Other provider involved in patient's care AEB notes are available in epic to review.  Patient/Guardian was advised Release of Information must be obtained prior to any record release in order to collaborate their care with an outside provider. Patient/Guardian was advised if they have not already done so to contact the registration department to sign all necessary forms in order for [place] to release information regarding their care.   Consent: Patient/Guardian gives verbal consent for treatment and assignment of benefits for services provided during this visit. Patient/Guardian expressed understanding and agreed to proceed.    I provided 32 minutes of non-face-to-face time during this encounter.   Kathlee Nations, MD

## 2022-02-07 ENCOUNTER — Other Ambulatory Visit: Payer: Self-pay | Admitting: Family Medicine

## 2022-02-07 ENCOUNTER — Encounter: Payer: Self-pay | Admitting: Family Medicine

## 2022-02-07 DIAGNOSIS — S0990XA Unspecified injury of head, initial encounter: Secondary | ICD-10-CM

## 2022-02-08 ENCOUNTER — Ambulatory Visit
Admission: RE | Admit: 2022-02-08 | Discharge: 2022-02-08 | Disposition: A | Discharge status: Home / Self Care | Payer: No Typology Code available for payment source | Source: Ambulatory Visit | Attending: Family Medicine | Admitting: Family Medicine

## 2022-02-08 ENCOUNTER — Other Ambulatory Visit: Payer: Self-pay | Admitting: Family Medicine

## 2022-02-08 DIAGNOSIS — W19XXXA Unspecified fall, initial encounter: Secondary | ICD-10-CM

## 2022-02-08 DIAGNOSIS — S0990XA Unspecified injury of head, initial encounter: Secondary | ICD-10-CM

## 2022-02-24 ENCOUNTER — Emergency Department (HOSPITAL_COMMUNITY): Payer: No Typology Code available for payment source

## 2022-02-24 ENCOUNTER — Encounter (HOSPITAL_COMMUNITY): Payer: Self-pay

## 2022-02-24 ENCOUNTER — Other Ambulatory Visit: Payer: Self-pay

## 2022-02-24 ENCOUNTER — Emergency Department (HOSPITAL_COMMUNITY)
Admission: EM | Admit: 2022-02-24 | Discharge: 2022-02-25 | Disposition: A | Discharge status: Home / Self Care | Payer: No Typology Code available for payment source | Attending: Emergency Medicine | Admitting: Emergency Medicine

## 2022-02-24 DIAGNOSIS — W01198A Fall on same level from slipping, tripping and stumbling with subsequent striking against other object, initial encounter: Secondary | ICD-10-CM | POA: Diagnosis not present

## 2022-02-24 DIAGNOSIS — I10 Essential (primary) hypertension: Secondary | ICD-10-CM | POA: Diagnosis not present

## 2022-02-24 DIAGNOSIS — S40812A Abrasion of left upper arm, initial encounter: Secondary | ICD-10-CM | POA: Diagnosis not present

## 2022-02-24 DIAGNOSIS — S80811A Abrasion, right lower leg, initial encounter: Secondary | ICD-10-CM | POA: Insufficient documentation

## 2022-02-24 DIAGNOSIS — S80812A Abrasion, left lower leg, initial encounter: Secondary | ICD-10-CM | POA: Diagnosis not present

## 2022-02-24 DIAGNOSIS — Z7984 Long term (current) use of oral hypoglycemic drugs: Secondary | ICD-10-CM | POA: Insufficient documentation

## 2022-02-24 DIAGNOSIS — Z8551 Personal history of malignant neoplasm of bladder: Secondary | ICD-10-CM | POA: Diagnosis not present

## 2022-02-24 DIAGNOSIS — S6992XA Unspecified injury of left wrist, hand and finger(s), initial encounter: Secondary | ICD-10-CM | POA: Diagnosis present

## 2022-02-24 DIAGNOSIS — Y92009 Unspecified place in unspecified non-institutional (private) residence as the place of occurrence of the external cause: Secondary | ICD-10-CM | POA: Diagnosis not present

## 2022-02-24 DIAGNOSIS — E1065 Type 1 diabetes mellitus with hyperglycemia: Secondary | ICD-10-CM | POA: Diagnosis not present

## 2022-02-24 DIAGNOSIS — S0990XA Unspecified injury of head, initial encounter: Secondary | ICD-10-CM | POA: Insufficient documentation

## 2022-02-24 DIAGNOSIS — S40811A Abrasion of right upper arm, initial encounter: Secondary | ICD-10-CM | POA: Insufficient documentation

## 2022-02-24 DIAGNOSIS — Z7902 Long term (current) use of antithrombotics/antiplatelets: Secondary | ICD-10-CM | POA: Diagnosis not present

## 2022-02-24 DIAGNOSIS — S61412A Laceration without foreign body of left hand, initial encounter: Secondary | ICD-10-CM | POA: Diagnosis not present

## 2022-02-24 DIAGNOSIS — R944 Abnormal results of kidney function studies: Secondary | ICD-10-CM | POA: Diagnosis not present

## 2022-02-24 DIAGNOSIS — W19XXXA Unspecified fall, initial encounter: Secondary | ICD-10-CM

## 2022-02-24 DIAGNOSIS — Z794 Long term (current) use of insulin: Secondary | ICD-10-CM | POA: Insufficient documentation

## 2022-02-24 LAB — COMPREHENSIVE METABOLIC PANEL
ALT: 13 U/L (ref 0–44)
AST: 14 U/L — ABNORMAL LOW (ref 15–41)
Albumin: 3.3 g/dL — ABNORMAL LOW (ref 3.5–5.0)
Alkaline Phosphatase: 73 U/L (ref 38–126)
Anion gap: 12 (ref 5–15)
BUN: 22 mg/dL — ABNORMAL HIGH (ref 6–20)
CO2: 22 mmol/L (ref 22–32)
Calcium: 9.2 mg/dL (ref 8.9–10.3)
Chloride: 103 mmol/L (ref 98–111)
Creatinine, Ser: 1.6 mg/dL — ABNORMAL HIGH (ref 0.61–1.24)
GFR, Estimated: 51 mL/min — ABNORMAL LOW (ref >60)
Glucose, Bld: 184 mg/dL — ABNORMAL HIGH (ref 70–99)
Potassium: 3.7 mmol/L (ref 3.5–5.1)
Sodium: 137 mmol/L (ref 135–145)
Total Bilirubin: 0.3 mg/dL (ref 0.3–1.2)
Total Protein: 6.2 g/dL — ABNORMAL LOW (ref 6.5–8.1)

## 2022-02-24 LAB — CBC
HCT: 38.9 % — ABNORMAL LOW (ref 39.0–52.0)
Hemoglobin: 13.4 g/dL (ref 13.0–17.0)
MCH: 28.5 pg (ref 26.0–34.0)
MCHC: 34.4 g/dL (ref 30.0–36.0)
MCV: 82.8 fL (ref 80.0–100.0)
Platelets: 295 10*3/uL (ref 150–400)
RBC: 4.7 MIL/uL (ref 4.22–5.81)
RDW: 13 % (ref 11.5–15.5)
WBC: 8.1 10*3/uL (ref 4.0–10.5)
nRBC: 0 % (ref 0.0–0.2)

## 2022-02-24 LAB — URINALYSIS, ROUTINE W REFLEX MICROSCOPIC
Bacteria, UA: NONE SEEN
Bilirubin Urine: NEGATIVE
Glucose, UA: >=500 mg/dL — AB
Hgb urine dipstick: NEGATIVE
Ketones, ur: NEGATIVE mg/dL
Leukocytes,Ua: NEGATIVE
Nitrite: NEGATIVE
Protein, ur: NEGATIVE mg/dL
Specific Gravity, Urine: 1.025 (ref 1.005–1.030)
pH: 5 (ref 5.0–8.0)

## 2022-02-24 LAB — LACTIC ACID, PLASMA
Lactic Acid, Venous: 3 mmol/L (ref 0.5–1.9)
Lactic Acid, Venous: 2.2 mmol/L (ref 0.5–1.9)

## 2022-02-24 LAB — SAMPLE TO BLOOD BANK

## 2022-02-24 LAB — I-STAT CHEM 8, ED
BUN: 25 mg/dL — ABNORMAL HIGH (ref 6–20)
Calcium, Ion: 1.13 mmol/L — ABNORMAL LOW (ref 1.15–1.40)
Chloride: 103 mmol/L (ref 98–111)
Creatinine, Ser: 1.5 mg/dL — ABNORMAL HIGH (ref 0.61–1.24)
Glucose, Bld: 189 mg/dL — ABNORMAL HIGH (ref 70–99)
HCT: 37 % — ABNORMAL LOW (ref 39.0–52.0)
Hemoglobin: 12.6 g/dL — ABNORMAL LOW (ref 13.0–17.0)
Potassium: 3.8 mmol/L (ref 3.5–5.1)
Sodium: 137 mmol/L (ref 135–145)
TCO2: 22 mmol/L (ref 22–32)

## 2022-02-24 LAB — PROTIME-INR
INR: 1 (ref 0.8–1.2)
Prothrombin Time: 13.1 seconds (ref 11.4–15.2)

## 2022-02-24 LAB — ETHANOL: Alcohol, Ethyl (B): <10 mg/dL (ref <10)

## 2022-02-24 MED ORDER — LACTATED RINGERS IV BOLUS
1000.0000 mL | Freq: Once | INTRAVENOUS | Status: AC
  Administered 2022-02-24: 1000 mL via INTRAVENOUS

## 2022-02-24 NOTE — ED Triage Notes (Signed)
.   Pt BIB [facility] from home d/t Mechanical fall trying to reach the counter. Struck head on the floor.  Pt on plavix. Hypotension in the 80s. Pt is diabetic. A&O X4.   600 ml fluids given by EMS.  Cbg 457

## 2022-02-24 NOTE — Discharge Instructions (Signed)
You were evaluated in the emergency department after a fall.  You received advanced imaging which did not show any signs of fracture or intracranial injury.  You received a chest x-ray which did show a nodularity.  Please follow-up with your primary care doctor for a repeat chest x-ray sometime in the next few weeks.

## 2022-02-24 NOTE — ED Provider Notes (Signed)
[facility] EMERGENCY DEPARTMENT Provider Note   CSN: [id number] Arrival date & time: 02/24/22  1815     History  Chief Complaint  Patient presents with   Vincent [name] is a 56 y.o. male.   Fall  The patient is a 56 year old male with past medical history of hypertension, BPD 2, IDDM, bladder cancer, seizure who is presenting by EMS as a level 2 trauma activation for mechanical fall while on Plavix.  The patient states that he has gait instability at baseline walks with a cane.  He was not using his cane at the time of his fall when he was walking to the kitchen.  He bumped into the corner of his [place] which knocked him into the refrigerator.  He attempted to grab the handle on the way down but fell striking his head on the corner of a table.  The patient had been mopping the floor and his chairs were on top of the table at the time.  He knocked one of them off on his way down which then struck him in the back of the head.  The patient denies any loss of consciousness.  EMS report hypotension and route with a SBP of 90.  The patient is complaining of headache and blurry vision which began during transport.     Home Medications Prior to Admission medications   Medication Sig Start Date End Date Taking? Authorizing Provider  acetaminophen (TYLENOL) 500 MG tablet Take 1,000 mg by mouth every 6 (six) hours as needed for moderate pain or headache.    [provider]  clopidogrel (PLAVIX) 75 MG tablet Take 1 tablet (75 mg total) by mouth daily. 10/12/21   Suzzanne Cloud, NP  doxycycline (VIBRAMYCIN) 100 MG capsule Take 1 capsule (100 mg total) by mouth 2 (two) times daily. 12/19/21   Blanchie Dessert, MD  DULoxetine (CYMBALTA) 60 MG capsule Take 1 capsule (60 mg total) by mouth daily. 10/12/21   Suzzanne Cloud, NP  fenofibrate 160 MG tablet Take 160 mg by mouth daily. 12/31/19   [provider]  HUMALOG KWIKPEN 200 UNIT/ML KwikPen Inject  15 Units into the skin in the morning and at bedtime. 06/17/21   [provider]  lamoTRIgine (LAMICTAL) 150 MG tablet Take 1 tablet (150 mg total) by mouth 2 (two) times daily. 10/12/21   Suzzanne Cloud, NP  lurasidone (LATUDA) 80 MG TABS tablet Take 1 tablet (80 mg total) by mouth daily with breakfast. 01/25/22   Arfeen, Arlyce Harman, MD  metFORMIN (GLUCOPHAGE-XR) 500 MG 24 hr tablet Take 500 mg by mouth 2 (two) times daily. 08/24/18   [provider]  omeprazole (PRILOSEC) 40 MG capsule Take 40 mg by mouth daily. 08/23/21   [provider]  pregabalin (LYRICA) 200 MG capsule Take 200 mg by mouth 3 (three) times daily. 12/25/21   Hayden Rasmussen, MD  rosuvastatin (CRESTOR) 10 MG tablet Take 1 tablet (10 mg total) by mouth daily. 08/04/21 09/03/21  Briant Cedar, MD  tamsulosin (FLOMAX) 0.4 MG CAPS capsule Take 0.8 mg by mouth at bedtime.    [provider]  testosterone cypionate (DEPOTESTOSTERONE CYPIONATE) 200 MG/ML injection Inject 200 mg into the skin every 14 (fourteen) days. 07/03/21   [provider]  traZODone (DESYREL) 150 MG tablet Take 1 tablet (150 mg total) by mouth at bedtime as needed for sleep. 01/25/22   Arfeen, Arlyce Harman, MD  venlafaxine XR [facility])  150 MG 24 hr capsule Take 1 capsule (150 mg total) by mouth daily with breakfast. 01/25/22   Arfeen, Arlyce Harman, MD  Vitamin D, Ergocalciferol, (DRISDOL) 1.25 MG (50000 UNIT) CAPS capsule Take 50,000 Units by mouth every Wednesday. 12/26/19   [provider]      Allergies    Celebrex [celecoxib], Hydrocodone, and Sulfa antibiotics    Review of Systems   Review of Systems  See HPI  Physical Exam Updated Vital Signs BP 105/63   Pulse 81   Temp 97.6 F (36.4 C) (Oral)   Resp 15   Ht 6' (1.829 m)   Wt 80.7 kg   SpO2 97%   BMI 24.14 kg/m  Physical Exam Vitals and nursing note reviewed.  Constitutional:      General: He is not in acute distress.    Appearance: He is  well-developed.  HENT:     Head: Normocephalic and atraumatic.  Eyes:     Conjunctiva/sclera: Conjunctivae normal.  Cardiovascular:     Rate and Rhythm: Normal rate and regular rhythm.     Heart sounds: No murmur heard. Pulmonary:     Effort: Pulmonary effort is normal. No respiratory distress.     Breath sounds: Normal breath sounds.  Abdominal:     Palpations: Abdomen is soft.     Tenderness: There is no abdominal tenderness.  Musculoskeletal:        General: No swelling.     Cervical back: Neck supple.  Skin:    General: Skin is warm and dry.     Capillary Refill: Capillary refill takes less than 2 seconds.     Comments: Superficial skin tear to the dorsal surface of the left hand.  Multiple diffuse scabbed over excoriations on the upper and lower extremities.  Neurological:     General: No focal deficit present.     Mental Status: He is alert and oriented to person, place, and time. Mental status is at baseline.     Cranial Nerves: No cranial nerve deficit.     Sensory: No sensory deficit.     Motor: No weakness.     Coordination: Coordination normal.  Psychiatric:        Mood and Affect: Mood normal.     ED Results / Procedures / Treatments   Labs (all labs ordered are listed, but only abnormal results are displayed) Labs Reviewed  COMPREHENSIVE METABOLIC PANEL - Abnormal; Notable for the following components:      Result Value   Glucose, Bld 184 (*)    BUN 22 (*)    Creatinine, Ser 1.60 (*)    Total Protein 6.2 (*)    Albumin 3.3 (*)    AST 14 (*)    GFR, Estimated 51 (*)    All other components within normal limits  CBC - Abnormal; Notable for the following components:   HCT 38.9 (*)    All other components within normal limits  URINALYSIS, ROUTINE W REFLEX MICROSCOPIC - Abnormal; Notable for the following components:   Color, Urine STRAW (*)    Glucose, UA >=500 (*)    All other components within normal limits  LACTIC ACID, PLASMA - Abnormal; Notable for  the following components:   Lactic Acid, Venous 3.0 (*)    All other components within normal limits  LACTIC ACID, PLASMA - Abnormal; Notable for the following components:   Lactic Acid, Venous 2.2 (*)    All other components within normal limits  I-STAT CHEM 8, ED -  Abnormal; Notable for the following components:   BUN 25 (*)    Creatinine, Ser 1.50 (*)    Glucose, Bld 189 (*)    Calcium, Ion 1.13 (*)    Hemoglobin 12.6 (*)    HCT 37.0 (*)    All other components within normal limits  ETHANOL  PROTIME-INR  RAPID URINE DRUG SCREEN, HOSP PERFORMED  LACTIC ACID, PLASMA  SAMPLE TO BLOOD BANK    EKG None  Radiology CT HEAD WO CONTRAST  Result Date: 02/24/2022 CLINICAL DATA:  Head trauma on Plavix EXAM: CT HEAD WITHOUT CONTRAST CT CERVICAL SPINE WITHOUT CONTRAST TECHNIQUE: Multidetector CT imaging of the head and cervical spine was performed following the standard protocol without intravenous contrast. Multiplanar CT image reconstructions of the cervical spine were also generated. RADIATION DOSE REDUCTION: This exam was performed according to the departmental dose-optimization program which includes automated exposure control, adjustment of the mA and/or kV according to patient size and/or use of iterative reconstruction technique. COMPARISON:  CT brain 02/08/2022, CT brain and cervical spine 07/28/2021 FINDINGS: CT HEAD FINDINGS Brain: No evidence of acute infarction, hemorrhage, hydrocephalus, extra-axial collection or mass lesion/mass effect. Vascular: No hyperdense vessel or unexpected calcification. Carotid vascular calcification Skull: Normal. Negative for fracture or focal lesion. Sinuses/Orbits: No acute finding. Other: None CT CERVICAL SPINE FINDINGS Alignment: No subluxation.  Facet alignment within normal limits Skull base and vertebrae: No acute fracture. No primary bone lesion or focal pathologic process. Soft tissues and spinal canal: No prevertebral fluid or swelling. No visible  canal hematoma. Disc levels: Mild disc space narrowing and degenerative change C6-C7. Fusion of the left facets at C3-C4. Upper chest: Negative. Other: None IMPRESSION: 1. Negative non contrasted CT appearance of the brain. 2. Mild degenerative changes of the cervical spine. No acute osseous abnormality. Electronically Signed   By: Donavan Foil M.D.   On: 02/24/2022 19:07   CT CERVICAL SPINE WO CONTRAST  Result Date: 02/24/2022 CLINICAL DATA:  Head trauma on Plavix EXAM: CT HEAD WITHOUT CONTRAST CT CERVICAL SPINE WITHOUT CONTRAST TECHNIQUE: Multidetector CT imaging of the head and cervical spine was performed following the standard protocol without intravenous contrast. Multiplanar CT image reconstructions of the cervical spine were also generated. RADIATION DOSE REDUCTION: This exam was performed according to the departmental dose-optimization program which includes automated exposure control, adjustment of the mA and/or kV according to patient size and/or use of iterative reconstruction technique. COMPARISON:  CT brain 02/08/2022, CT brain and cervical spine 07/28/2021 FINDINGS: CT HEAD FINDINGS Brain: No evidence of acute infarction, hemorrhage, hydrocephalus, extra-axial collection or mass lesion/mass effect. Vascular: No hyperdense vessel or unexpected calcification. Carotid vascular calcification Skull: Normal. Negative for fracture or focal lesion. Sinuses/Orbits: No acute finding. Other: None CT CERVICAL SPINE FINDINGS Alignment: No subluxation.  Facet alignment within normal limits Skull base and vertebrae: No acute fracture. No primary bone lesion or focal pathologic process. Soft tissues and spinal canal: No prevertebral fluid or swelling. No visible canal hematoma. Disc levels: Mild disc space narrowing and degenerative change C6-C7. Fusion of the left facets at C3-C4. Upper chest: Negative. Other: None IMPRESSION: 1. Negative non contrasted CT appearance of the brain. 2. Mild degenerative changes  of the cervical spine. No acute osseous abnormality. Electronically Signed   By: Donavan Foil M.D.   On: 02/24/2022 19:07   DG Chest Port 1 View  Result Date: 02/24/2022 CLINICAL DATA:  Trauma EXAM: PORTABLE CHEST 1 VIEW COMPARISON:  Chest x-ray 05/29/2021, CT chest 06/10/2020 FINDINGS: The heart  and mediastinal contours are within normal limits. Question retrocardiac nodularity measuring 2.6 cm. No focal consolidation. No pulmonary edema. No pleural effusion. No pneumothorax. No acute osseous abnormality. IMPRESSION: 1. Question retrocardiac nodularity measuring 2.6 cm. Recommend repeat chest x-ray PA and lateral view of the chest for further evaluation. 2. Otherwise no active disease. Electronically Signed   By: Iven Finn M.D.   On: 02/24/2022 19:00    Procedures Procedures    Medications Ordered in ED Medications  lactated ringers bolus 1,000 mL (1,000 mLs Intravenous New Bag/Given 02/24/22 2057)    ED Course/ Medical Decision Making/ A&P                           Medical Decision Making  The patient is a 56 year old male with past medical history of hypertension, BPD 2, IDDM, bladder cancer, seizure who is presenting by EMS as a level 2 trauma activation for mechanical fall while on Plavix.  The differential diagnosis considered includes mechanical fall versus syncope with possible intercranial injury, fracture, dislocation, concussion.  On arrival, the patient was hypotensive with SBP in the 90s and reporting a headache and blurry vision.  He was alert and oriented x 4.  The patient's physical exam was significant for a superficial skin tear to the left hand without additional abnormal findings.  The patient's diagnostic workup, which I independently reviewed and interpreted, included a CT head without intracranial abnormality; CT C-spine without fracture or dislocation; chest x-ray with possible retrocardiac nodularity measuring 2.6 cm.  He received a CBC with white blood cell  count of 8.1 and hemoglobin of 13.4; CMP with glucose of 184, BUN 22, creatinine 1.6; PT/INR within normal limits; ethanol negative; urinalysis with no signs of infection.  The patient also received a lactic acid which was elevated to 3.0.  The patient was given a bolus of IV fluids and repeat lactic improved to 2.2.  The patient was reassessed and was found to be at his mental status baseline without neurological deficit noted on exam.   Based on the patient's history which describes a mechanical fall, absence of intracranial injury, electrolyte abnormality, or infection to suggest alternative causes of a fall the patient was determined to be safe for discharge with outpatient follow-up.  He was instructed to follow-up with his PCP in 24-48 hours.  The patient was notified of the nodularity noted on his chest x-ray and advised to follow-up with his PCP for a outpatient chest x-ray sometime within the next few weeks.  He was given strict return precautions to the emergency department.  Amount and/or Complexity of Data Reviewed Independent Historian: EMS Labs: ordered. Radiology: ordered.   Patient's presentation is most consistent with acute presentation with potential threat to life or bodily function.         Final Clinical Impression(s) / ED Diagnoses Final diagnoses:  Fall, initial encounter    Rx / DC Orders ED Discharge Orders     None         Dani Gobble, MD 02/24/22 Darci Needle    Isla Pence, MD 02/25/22 [phone]

## 2022-03-01 ENCOUNTER — Other Ambulatory Visit (HOSPITAL_COMMUNITY): Payer: Self-pay | Admitting: Psychiatry

## 2022-03-01 DIAGNOSIS — F4312 Post-traumatic stress disorder, chronic: Secondary | ICD-10-CM

## 2022-03-01 DIAGNOSIS — F3181 Bipolar II disorder: Secondary | ICD-10-CM

## 2022-03-08 ENCOUNTER — Ambulatory Visit
Admission: RE | Admit: 2022-03-08 | Discharge: 2022-03-08 | Disposition: A | Discharge status: Home / Self Care | Payer: No Typology Code available for payment source | Source: Ambulatory Visit | Attending: Family Medicine | Admitting: Family Medicine

## 2022-03-08 ENCOUNTER — Other Ambulatory Visit: Payer: Self-pay | Admitting: Family Medicine

## 2022-03-08 DIAGNOSIS — R222 Localized swelling, mass and lump, trunk: Secondary | ICD-10-CM

## 2022-03-14 ENCOUNTER — Encounter (HOSPITAL_BASED_OUTPATIENT_CLINIC_OR_DEPARTMENT_OTHER): Payer: Medicare HMO | Admitting: General Surgery

## 2022-04-05 ENCOUNTER — Emergency Department (HOSPITAL_BASED_OUTPATIENT_CLINIC_OR_DEPARTMENT_OTHER): Payer: Medicare HMO | Admitting: Radiology

## 2022-04-05 ENCOUNTER — Telehealth (HOSPITAL_COMMUNITY): Payer: Medicare HMO | Admitting: Psychiatry

## 2022-04-05 ENCOUNTER — Other Ambulatory Visit: Payer: Self-pay

## 2022-04-05 ENCOUNTER — Encounter (HOSPITAL_COMMUNITY): Payer: Self-pay

## 2022-04-05 ENCOUNTER — Emergency Department (HOSPITAL_BASED_OUTPATIENT_CLINIC_OR_DEPARTMENT_OTHER)
Admission: EM | Admit: 2022-04-05 | Discharge: 2022-04-05 | Disposition: A | Discharge status: Home / Self Care | Payer: Medicare HMO | Attending: Emergency Medicine | Admitting: Emergency Medicine

## 2022-04-05 ENCOUNTER — Emergency Department (HOSPITAL_BASED_OUTPATIENT_CLINIC_OR_DEPARTMENT_OTHER): Payer: Medicare HMO

## 2022-04-05 ENCOUNTER — Encounter (HOSPITAL_BASED_OUTPATIENT_CLINIC_OR_DEPARTMENT_OTHER): Payer: Self-pay | Admitting: *Deleted

## 2022-04-05 DIAGNOSIS — E1100 Type 2 diabetes mellitus with hyperosmolarity without nonketotic hyperglycemic-hyperosmolar coma (NKHHC): Secondary | ICD-10-CM | POA: Insufficient documentation

## 2022-04-05 DIAGNOSIS — J181 Lobar pneumonia, unspecified organism: Secondary | ICD-10-CM | POA: Diagnosis not present

## 2022-04-05 DIAGNOSIS — Z794 Long term (current) use of insulin: Secondary | ICD-10-CM | POA: Insufficient documentation

## 2022-04-05 DIAGNOSIS — F17210 Nicotine dependence, cigarettes, uncomplicated: Secondary | ICD-10-CM | POA: Diagnosis not present

## 2022-04-05 DIAGNOSIS — Z7902 Long term (current) use of antithrombotics/antiplatelets: Secondary | ICD-10-CM | POA: Diagnosis not present

## 2022-04-05 DIAGNOSIS — E039 Hypothyroidism, unspecified: Secondary | ICD-10-CM | POA: Diagnosis not present

## 2022-04-05 DIAGNOSIS — Z1152 Encounter for screening for COVID-19: Secondary | ICD-10-CM | POA: Insufficient documentation

## 2022-04-05 DIAGNOSIS — I2510 Atherosclerotic heart disease of native coronary artery without angina pectoris: Secondary | ICD-10-CM | POA: Insufficient documentation

## 2022-04-05 DIAGNOSIS — R197 Diarrhea, unspecified: Secondary | ICD-10-CM | POA: Diagnosis not present

## 2022-04-05 DIAGNOSIS — R531 Weakness: Secondary | ICD-10-CM | POA: Diagnosis present

## 2022-04-05 DIAGNOSIS — J189 Pneumonia, unspecified organism: Secondary | ICD-10-CM

## 2022-04-05 DIAGNOSIS — I700 Atherosclerosis of aorta: Secondary | ICD-10-CM | POA: Diagnosis not present

## 2022-04-05 DIAGNOSIS — Z8673 Personal history of transient ischemic attack (TIA), and cerebral infarction without residual deficits: Secondary | ICD-10-CM | POA: Diagnosis not present

## 2022-04-05 DIAGNOSIS — R1032 Left lower quadrant pain: Secondary | ICD-10-CM | POA: Diagnosis not present

## 2022-04-05 LAB — I-STAT VENOUS BLOOD GAS, ED
Acid-base deficit: 3 mmol/L — ABNORMAL HIGH (ref 0.0–2.0)
Bicarbonate: 21.6 mmol/L (ref 20.0–28.0)
Calcium, Ion: 1.19 mmol/L (ref 1.15–1.40)
HCT: 36 % — ABNORMAL LOW (ref 39.0–52.0)
Hemoglobin: 12.2 g/dL — ABNORMAL LOW (ref 13.0–17.0)
O2 Saturation: 60 %
Patient temperature: 97.9
Potassium: 4.8 mmol/L (ref 3.5–5.1)
Sodium: 123 mmol/L — ABNORMAL LOW (ref 135–145)
TCO2: 23 mmol/L (ref 22–32)
pCO2, Ven: 34.6 mmHg — ABNORMAL LOW (ref 44–60)
pH, Ven: 7.401 (ref 7.25–7.43)
pO2, Ven: 30 mmHg — CL (ref 32–45)

## 2022-04-05 LAB — CBG MONITORING, ED
Glucose-Capillary: >600 mg/dL (ref 70–99)
Glucose-Capillary: >600 mg/dL (ref 70–99)
Glucose-Capillary: >600 mg/dL (ref 70–99)
Glucose-Capillary: 384 mg/dL — ABNORMAL HIGH (ref 70–99)
Glucose-Capillary: 307 mg/dL — ABNORMAL HIGH (ref 70–99)
Glucose-Capillary: 236 mg/dL — ABNORMAL HIGH (ref 70–99)
Glucose-Capillary: 159 mg/dL — ABNORMAL HIGH (ref 70–99)
Glucose-Capillary: 182 mg/dL — ABNORMAL HIGH (ref 70–99)

## 2022-04-05 LAB — RESP PANEL BY RT-PCR (RSV, FLU A&B, COVID)  RVPGX2
Influenza A by PCR: NEGATIVE
Influenza B by PCR: NEGATIVE
Resp Syncytial Virus by PCR: NEGATIVE
SARS Coronavirus 2 by RT PCR: NEGATIVE

## 2022-04-05 LAB — URINALYSIS, ROUTINE W REFLEX MICROSCOPIC
Bacteria, UA: NONE SEEN
Bilirubin Urine: NEGATIVE
Glucose, UA: >1000 mg/dL — AB
Hgb urine dipstick: NEGATIVE
Ketones, ur: NEGATIVE mg/dL
Leukocytes,Ua: NEGATIVE
Nitrite: NEGATIVE
Specific Gravity, Urine: 1.027 (ref 1.005–1.030)
pH: 7.5 (ref 5.0–8.0)

## 2022-04-05 LAB — COMPREHENSIVE METABOLIC PANEL
ALT: 7 U/L (ref 0–44)
AST: 6 U/L — ABNORMAL LOW (ref 15–41)
Albumin: 3.4 g/dL — ABNORMAL LOW (ref 3.5–5.0)
Alkaline Phosphatase: 100 U/L (ref 38–126)
Anion gap: 11 (ref 5–15)
BUN: 17 mg/dL (ref 6–20)
CO2: 21 mmol/L — ABNORMAL LOW (ref 22–32)
Calcium: 8.9 mg/dL (ref 8.9–10.3)
Chloride: 86 mmol/L — ABNORMAL LOW (ref 98–111)
Creatinine, Ser: 1.18 mg/dL (ref 0.61–1.24)
GFR, Estimated: >60 mL/min (ref >60)
Glucose, Bld: 1132 mg/dL (ref 70–99)
Potassium: 5 mmol/L (ref 3.5–5.1)
Sodium: 118 mmol/L — CL (ref 135–145)
Total Bilirubin: 0.5 mg/dL (ref 0.3–1.2)
Total Protein: 6.8 g/dL (ref 6.5–8.1)

## 2022-04-05 LAB — LACTIC ACID, PLASMA
Lactic Acid, Venous: 3.3 mmol/L (ref 0.5–1.9)
Lactic Acid, Venous: 2.7 mmol/L (ref 0.5–1.9)

## 2022-04-05 LAB — BASIC METABOLIC PANEL
Anion gap: 10 (ref 5–15)
BUN: 13 mg/dL (ref 6–20)
CO2: 25 mmol/L (ref 22–32)
Calcium: 8.7 mg/dL — ABNORMAL LOW (ref 8.9–10.3)
Chloride: 99 mmol/L (ref 98–111)
Creatinine, Ser: 1.04 mg/dL (ref 0.61–1.24)
GFR, Estimated: >60 mL/min (ref >60)
Glucose, Bld: 389 mg/dL — ABNORMAL HIGH (ref 70–99)
Potassium: 3.6 mmol/L (ref 3.5–5.1)
Sodium: 134 mmol/L — ABNORMAL LOW (ref 135–145)

## 2022-04-05 LAB — CBC WITH DIFFERENTIAL/PLATELET
Abs Immature Granulocytes: 0.09 10*3/uL — ABNORMAL HIGH (ref 0.00–0.07)
Basophils Absolute: 0.1 10*3/uL (ref 0.0–0.1)
Basophils Relative: 1 %
Eosinophils Absolute: 0.1 10*3/uL (ref 0.0–0.5)
Eosinophils Relative: 1 %
HCT: 36.2 % — ABNORMAL LOW (ref 39.0–52.0)
Hemoglobin: 12.7 g/dL — ABNORMAL LOW (ref 13.0–17.0)
Immature Granulocytes: 1 %
Lymphocytes Relative: 17 %
Lymphs Abs: 1.7 10*3/uL (ref 0.7–4.0)
MCH: 27.9 pg (ref 26.0–34.0)
MCHC: 35.1 g/dL (ref 30.0–36.0)
MCV: 79.4 fL — ABNORMAL LOW (ref 80.0–100.0)
Monocytes Absolute: 0.6 10*3/uL (ref 0.1–1.0)
Monocytes Relative: 6 %
Neutro Abs: 7.5 10*3/uL (ref 1.7–7.7)
Neutrophils Relative %: 74 %
Platelets: 315 10*3/uL (ref 150–400)
RBC: 4.56 MIL/uL (ref 4.22–5.81)
RDW: 12.8 % (ref 11.5–15.5)
WBC: 10 10*3/uL (ref 4.0–10.5)
nRBC: 0 % (ref 0.0–0.2)

## 2022-04-05 LAB — LIPASE, BLOOD: Lipase: 160 U/L — ABNORMAL HIGH (ref 11–51)

## 2022-04-05 LAB — OSMOLALITY: Osmolality: 317 mOsm/kg — ABNORMAL HIGH (ref 275–295)

## 2022-04-05 LAB — TSH: TSH: 3.924 u[IU]/mL (ref 0.350–4.500)

## 2022-04-05 LAB — TROPONIN I (HIGH SENSITIVITY): Troponin I (High Sensitivity): 4 ng/L (ref <18)

## 2022-04-05 LAB — MAGNESIUM: Magnesium: 2 mg/dL (ref 1.7–2.4)

## 2022-04-05 LAB — OCCULT BLOOD X 1 CARD TO LAB, STOOL: Fecal Occult Bld: NEGATIVE

## 2022-04-05 LAB — BETA-HYDROXYBUTYRIC ACID: Beta-Hydroxybutyric Acid: 0.16 mmol/L (ref 0.05–0.27)

## 2022-04-05 MED ORDER — LACTATED RINGERS IV SOLN: INTRAVENOUS | Status: DC

## 2022-04-05 MED ORDER — NICOTINE 14 MG/24HR TD PT24
14.0000 mg | MEDICATED_PATCH | Freq: Once | TRANSDERMAL | Status: DC
  Administered 2022-04-05: 14 mg via TRANSDERMAL
  Filled 2022-04-05: qty 1

## 2022-04-05 MED ORDER — MORPHINE SULFATE (PF) 4 MG/ML IV SOLN
4.0000 mg | Freq: Once | INTRAVENOUS | Status: AC
  Administered 2022-04-05: 4 mg via INTRAVENOUS
  Filled 2022-04-05: qty 1

## 2022-04-05 MED ORDER — SODIUM CHLORIDE 0.9 % IV BOLUS
1000.0000 mL | Freq: Once | INTRAVENOUS | Status: AC
  Administered 2022-04-05: 1000 mL via INTRAVENOUS

## 2022-04-05 MED ORDER — AMOXICILLIN-POT CLAVULANATE 875-125 MG PO TABS: 1.0000 | ORAL_TABLET | Freq: Two times a day (BID) | ORAL | 0 refills | Status: DC

## 2022-04-05 MED ORDER — IOHEXOL 300 MG/ML  SOLN
100.0000 mL | Freq: Once | INTRAMUSCULAR | Status: AC | PRN
  Administered 2022-04-05: 75 mL via INTRAVENOUS

## 2022-04-05 MED ORDER — AZITHROMYCIN 250 MG PO TABS: 250.0000 mg | ORAL_TABLET | Freq: Every day | ORAL | 0 refills | Status: DC

## 2022-04-05 MED ORDER — POTASSIUM CHLORIDE 10 MEQ/100ML IV SOLN
10.0000 meq | INTRAVENOUS | Status: AC
  Administered 2022-04-05 (×2): 10 meq via INTRAVENOUS
  Filled 2022-04-05: qty 100

## 2022-04-05 MED ORDER — DEXTROSE 50 % IV SOLN: 0.0000 mL | INTRAVENOUS | Status: DC | PRN

## 2022-04-05 MED ORDER — DEXTROSE IN LACTATED RINGERS 5 % IV SOLN: INTRAVENOUS | Status: DC

## 2022-04-05 MED ORDER — SODIUM CHLORIDE 0.9 % IV SOLN
500.0000 mg | Freq: Once | INTRAVENOUS | Status: AC
  Administered 2022-04-05: 500 mg via INTRAVENOUS
  Filled 2022-04-05: qty 5

## 2022-04-05 MED ORDER — INSULIN GLARGINE-YFGN 100 UNIT/ML ~~LOC~~ SOLN
11.0000 [IU] | Freq: Once | SUBCUTANEOUS | Status: AC
  Administered 2022-04-05: 11 [IU] via SUBCUTANEOUS
  Filled 2022-04-05: qty 110

## 2022-04-05 MED ORDER — SODIUM CHLORIDE 0.9 % IV SOLN
1.0000 g | Freq: Once | INTRAVENOUS | Status: AC
  Administered 2022-04-05: 1 g via INTRAVENOUS
  Filled 2022-04-05: qty 10

## 2022-04-05 MED ORDER — INSULIN REGULAR(HUMAN) IN NACL 100-0.9 UT/100ML-% IV SOLN
INTRAVENOUS | Status: DC
  Administered 2022-04-05: 14 [IU]/h via INTRAVENOUS
  Administered 2022-04-05: 8 [IU]/h via INTRAVENOUS
  Filled 2022-04-05: qty 100

## 2022-04-05 NOTE — ED Notes (Signed)
Pt requested to leave AMA... Provider notified and went to talk to Pt.... Attempt was made to have Pt stay and continue to be monitored... Pt refused and left AMA.Marland KitchenMarland Kitchen

## 2022-04-05 NOTE — ED Notes (Signed)
Discharge paperwork given and verbally understood. 

## 2022-04-05 NOTE — ED Notes (Signed)
ED PA at [facility] 

## 2022-04-05 NOTE — ED Triage Notes (Signed)
BIB family from home for diarrhea x7d with associated general weakness. Describes diarrhea as "with blood". Denies pain, NV, or fever. Covid test at home negative x2.

## 2022-04-05 NOTE — ED Provider Notes (Signed)
   Reevaluation of the patient showed patient no longer willing to stay.  He states that "nothing will change my mind."  Reports he is feeling better after fluids and insulin, but he is "tired of sitting around and waiting for room."  Patient attempting to tear out Ivs; IVs taken out by nursing staff.  Risks of morbidity/mortality discussed with patient and he still elected to leave [facility].  Patient was deemed capacity.       Wilnette Kales, [place] 04/05/22 1830    Audley Hose, MD 04/05/22 [phone]

## 2022-04-05 NOTE — ED Notes (Signed)
Provider informed of Potassium at 5.0 and Provider wanted Potassium to still be given.

## 2022-04-05 NOTE — ED Notes (Signed)
Pt requested a nicotine patch.. Provider aware.Marland KitchenMarland Kitchen

## 2022-04-05 NOTE — Progress Notes (Signed)
Plan of Care Note for accepted transfer   Patient: Vincent [name] MRN: [id number]   DOA: 04/05/2022  Facility requesting transfer: [facility]. Requesting Provider: Copper Unk Lightning, PA-C and C. Tegeler, MD Reason for transfer: Diabetic hyperosmolar state and CAP. Facility course:  57 year old with a past medical history of bipolar 2 disorder, suicide attempt, seizure disorder, type 2 diabetes, gastroparesis, hypothyroidism essential tremor, CAD, hyperlipidemia who has not been compliant with his medications and presented to the emergency department with week of diarrhea.  Patient was found to be hyperosmolar nonketotic state and chest radiograph shows a right upper lobe pneumonia.  He is currently on an insulin infusion.  He received ceftriaxone and azithromycin.  Accepted to a stepdown bed at [facility] and alternatively could go to a PCU bed at [facility] if available.  Plan of care: The patient is accepted for admission to [facility] unit, at [facility]..   Author: Reubin Milan, MD 04/05/2022  Check [email or web address] for on-call coverage.  Nursing staff, Please call [place] number on Amion as soon as patient's arrival, so appropriate admitting provider can evaluate the pt.

## 2022-04-05 NOTE — ED Notes (Signed)
Pt is very upset about waiting for bed. Pt informed that his care is being carried out and time is not being wasted waiting. Pt wants a new room, bed, and wants everything disconnected. Wants to go home. Pt informed of risks of leaving due to his blood sugar and  PNA. Pt informed of the bed placement process and that many of these factors are out of our hands. Pt is cursing and yelling at this RN. Pt informed that our only interested in his care and safety. This RN informs that he will return when pt can speak respectfully this RN will come back. Pt informs this RN he will soon remove his IV and walk out. MD notified.

## 2022-04-05 NOTE — ED Provider Notes (Signed)
[facility] Provider Note   CSN: [id number] Arrival date & time: 04/05/22  1856     History  Chief Complaint  Patient presents with   Diarrhea    Vincent [name] is a 57 y.o. male.   Diarrhea   57 year old male presents emergency department with complaints of weakness, diarrhea.  Patient reports 7 to 9-day history of diarrhea reporting 15+ bowel movements per day.  States that the past few days, has noted streaky blood in stool.  Denies any recent travel, antibiotic usage, hiking/camping, change in medications.  Reports weakness as beginning in his lower extremities with progression to his whole body.  Reports feelings of shortness of breath over the past 4 days despite compliance with his at home medications.  States that he has been unable to tolerate p.o. secondary to not wanting to have diarrhea.  Reports left-sided abdominal pain as bouts of diarrhea have increased.  Denies fever, chest pain, nausea, vomiting, urinary symptoms.  Patient states that he has only been drinking Gatorade since diarrhea onset; patient is concerned for high blood sugar.  Past medical history significant for CVA with no current deficits per patient, bipolar 2 disorder, diabetes mellitus type 2, gastroparesis, hypothyroidism, essential tremor, CAD, hyperlipidemia, suicide attempt, seizure  Home Medications Prior to Admission medications   Medication Sig Start Date End Date Taking? Authorizing Provider  acetaminophen (TYLENOL) 500 MG tablet Take 1,000 mg by mouth every 6 (six) hours as needed for moderate pain or headache.   Yes [provider]  clopidogrel (PLAVIX) 75 MG tablet Take 1 tablet (75 mg total) by mouth daily. 10/12/21  Yes Suzzanne Cloud, NP  DULoxetine (CYMBALTA) 60 MG capsule Take 1 capsule (60 mg total) by mouth daily. 10/12/21  Yes Suzzanne Cloud, NP  fenofibrate 160 MG tablet Take 160 mg by mouth daily. 12/31/19  Yes [provider]   HUMALOG KWIKPEN 200 UNIT/ML KwikPen Inject 15 Units into the skin in the morning and at bedtime. 06/17/21  Yes [provider]  lamoTRIgine (LAMICTAL) 150 MG tablet Take 1 tablet (150 mg total) by mouth 2 (two) times daily. 10/12/21  Yes Suzzanne Cloud, NP  losartan (COZAAR) 25 MG tablet Take 25 mg by mouth daily. 03/14/22  Yes [provider]  lurasidone (LATUDA) 80 MG TABS tablet Take 1 tablet (80 mg total) by mouth daily with breakfast. 01/25/22  Yes Arfeen, Arlyce Harman, MD  omeprazole (PRILOSEC) 40 MG capsule Take 40 mg by mouth daily. 08/23/21  Yes [provider]  pregabalin (LYRICA) 200 MG capsule Take 200 mg by mouth 3 (three) times daily. 12/25/21  Yes Hayden Rasmussen, MD  rosuvastatin (CRESTOR) 10 MG tablet Take 1 tablet (10 mg total) by mouth daily. 08/04/21 04/05/22 Yes Pashayan, Redgie Grayer, MD  tamsulosin (FLOMAX) 0.4 MG CAPS capsule Take 0.8 mg by mouth at bedtime.   Yes [provider]  testosterone cypionate (DEPOTESTOSTERONE CYPIONATE) 200 MG/ML injection Inject 200 mg into the skin every 14 (fourteen) days. 07/03/21  Yes [provider]  TOUJEO SOLOSTAR 300 UNIT/ML Solostar Pen Inject into the skin. Pt uses 1 shot a week pre-filled pen 10/11/21  Yes [provider]  traZODone (DESYREL) 150 MG tablet Take 1 tablet (150 mg total) by mouth at bedtime as needed for sleep. 01/25/22  Yes Arfeen, Arlyce Harman, MD  venlafaxine XR (EFFEXOR-XR) 150 MG 24 hr capsule Take 1 capsule (150 mg total) by mouth daily with breakfast. 01/25/22  Yes Arfeen, Arlyce Harman, MD  Vitamin D, Ergocalciferol, (DRISDOL) 1.25 MG (50000 UNIT) CAPS capsule Take 50,000 Units by mouth every Wednesday. 12/26/19  Yes [provider]  doxycycline (VIBRAMYCIN) 100 MG capsule Take 1 capsule (100 mg total) by mouth 2 (two) times daily. Patient not taking: Reported on 04/05/2022 12/19/21   Blanchie Dessert, MD      Allergies    Celebrex [celecoxib], Hydrocodone, and Sulfa antibiotics     Review of Systems   Review of Systems  Gastrointestinal:  Positive for diarrhea.  All other systems reviewed and are negative.   Physical Exam Updated Vital Signs BP (!) 186/88   Pulse 85   Temp 99.2 F (37.3 C) (Oral)   Resp 15   Wt 80.7 kg   SpO2 100%   BMI 24.14 kg/m  Physical Exam Vitals and nursing note reviewed. Exam conducted with a chaperone present.  Constitutional:      General: He is not in acute distress.    Appearance: He is well-developed.  HENT:     Head: Normocephalic and atraumatic.  Eyes:     Conjunctiva/sclera: Conjunctivae normal.  Cardiovascular:     Rate and Rhythm: Normal rate and regular rhythm.     Heart sounds: No murmur heard. Pulmonary:     Effort: Pulmonary effort is normal. No respiratory distress.     Breath sounds: Normal breath sounds.  Abdominal:     Palpations: Abdomen is soft.     Tenderness: There is abdominal tenderness.     Comments: Left lower quadrant abdominal tenderness.  Genitourinary:    Comments: Patient with no active hematochezia/melena appreciated.  No lesions observable.  Stool obtained and sent via Hemoccult of which was brown in appearance with no obvious hematochezia/melena.  No palpable hemorrhoid, fissure appreciated. Musculoskeletal:        General: No swelling.     Cervical back: Neck supple.     Right lower leg: No edema.     Left lower leg: No edema.  Skin:    General: Skin is warm and dry.     Capillary Refill: Capillary refill takes less than 2 seconds.  Neurological:     Mental Status: He is alert.     Comments: Patient with symmetric appreciable weakness with muscle strength 3 out of 5 bilateral knee flexion/extension, hip flexion/extension, ankle dorsi/plantarflexion.  No DTRs appreciable lower extremities bilaterally. Patient with symmetrical appreciable weakness in bilateral upper extremities 3/5.  Psychiatric:        Mood and Affect: Mood normal.     ED Results / Procedures / Treatments    Labs (all labs ordered are listed, but only abnormal results are displayed) Labs Reviewed  COMPREHENSIVE METABOLIC PANEL - Abnormal; Notable for the following components:      Result Value   Sodium 118 (*)    Chloride 86 (*)    CO2 21 (*)    Glucose, Bld 1,132 (*)    Albumin 3.4 (*)    AST 6 (*)    All other components within normal limits  CBC WITH DIFFERENTIAL/PLATELET - Abnormal; Notable for the following components:   Hemoglobin 12.7 (*)    HCT 36.2 (*)    MCV 79.4 (*)    Abs Immature Granulocytes 0.09 (*)    All other components within normal limits  URINALYSIS, ROUTINE W REFLEX MICROSCOPIC - Abnormal; Notable for the following components:   Color, Urine COLORLESS (*)    Glucose, UA >1,000 (*)    Protein, ur  TRACE (*)    All other components within normal limits  LIPASE, BLOOD - Abnormal; Notable for the following components:   Lipase 160 (*)    All other components within normal limits  LACTIC ACID, PLASMA - Abnormal; Notable for the following components:   Lactic Acid, Venous 3.3 (*)    All other components within normal limits  CBG MONITORING, ED - Abnormal; Notable for the following components:   Glucose-Capillary >600 (*)    All other components within normal limits  CBG MONITORING, ED - Abnormal; Notable for the following components:   Glucose-Capillary >600 (*)    All other components within normal limits  I-STAT VENOUS BLOOD GAS, ED - Abnormal; Notable for the following components:   pCO2, Ven 34.6 (*)    pO2, Ven 30 (*)    Acid-base deficit 3.0 (*)    Sodium 123 (*)    HCT 36.0 (*)    Hemoglobin 12.2 (*)    All other components within normal limits  CBG MONITORING, ED - Abnormal; Notable for the following components:   Glucose-Capillary >600 (*)    All other components within normal limits  RESP PANEL BY RT-PCR (RSV, FLU A&B, COVID)  RVPGX2  OCCULT BLOOD X 1 CARD TO LAB, STOOL  MAGNESIUM  TSH  BETA-HYDROXYBUTYRIC ACID  OSMOLALITY  LACTIC ACID,  PLASMA  TROPONIN I (HIGH SENSITIVITY)    EKG EKG Interpretation  Date/Time:  Wednesday April 05 2022 10:10:01 EST Ventricular Rate:  81 PR Interval:  167 QRS Duration: 97 QT Interval:  384 QTC Calculation: 446 R Axis:   75 Text Interpretation: Sinus rhythm ST elev, probable normal early repol pattern when compared to prior, overall similar appearance. possibly sharper t waves. No STEMI Confirmed by Antony Blackbird [phone]) on 04/05/2022 10:34:12 AM  Radiology CT Abdomen Pelvis W Contrast  Result Date: 04/05/2022 CLINICAL DATA:  Left lower quadrant abdominal pain EXAM: CT ABDOMEN AND PELVIS WITH CONTRAST TECHNIQUE: Multidetector CT imaging of the abdomen and pelvis was performed using the standard protocol following bolus administration of intravenous contrast. RADIATION DOSE REDUCTION: This exam was performed according to the departmental dose-optimization program which includes automated exposure control, adjustment of the mA and/or kV according to patient size and/or use of iterative reconstruction technique. CONTRAST:  72m OMNIPAQUE IOHEXOL 300 MG/ML  SOLN COMPARISON:  CT abdomen pelvis 08/10/2020 FINDINGS: Lower chest: No acute abnormality. Hepatobiliary: No focal liver abnormality is seen. No gallstones, gallbladder wall thickening, or biliary dilatation. Pancreas: Unremarkable. No pancreatic ductal dilatation or surrounding inflammatory changes. Spleen: Normal in size without focal abnormality. Adrenals/Urinary Tract: Adrenal glands are unremarkable. Kidneys are normal, without renal calculi, focal lesion, or hydronephrosis. Bladder is unremarkable. Stomach/Bowel: Stomach is within normal limits. Appendix appears normal. No evidence of bowel wall thickening, distention, or inflammatory changes. Vascular/Lymphatic: Aortic atherosclerosis. No enlarged abdominal or pelvic lymph nodes. Reproductive: Prostate is unremarkable. Other: No abdominal wall hernia or abnormality. No abdominopelvic  ascites. Musculoskeletal: No acute or significant osseous findings. IMPRESSION: 1. No acute abnormality seen in the abdomen or pelvis. 2. Aortic atherosclerosis. Aortic Atherosclerosis (ICD10-I70.0). Electronically Signed   By: NAudie PintoM.D.   On: 04/05/2022 11:42   DG Chest 2 View  Result Date: 04/05/2022 CLINICAL DATA:  Diarrhea for 7 days, generalized weakness, COVID-19 negative, history coronary artery disease, COPD, hypertension EXAM: CHEST - 2 VIEW COMPARISON:  03/08/2022 FINDINGS: Normal heart size, mediastinal contours, and pulmonary vascularity. Atherosclerotic calcification aorta. Peribronchial thickening with new ill-defined RIGHT upper lobe opacity consistent with  focal pneumonia. Remaining lungs clear. No pleural effusion, pneumothorax, or acute osseous findings. IMPRESSION: New RIGHT upper lobe pneumonia. Electronically Signed   By: Lavonia Dana M.D.   On: 04/05/2022 10:54    Procedures .Critical Care  Performed by: Wilnette Kales, PA Authorized by: Wilnette Kales, PA   Critical care provider statement:    Critical care time (minutes):  39   Critical care was necessary to treat or prevent imminent or life-threatening deterioration of the following conditions:  Metabolic crisis and endocrine crisis   Critical care was time spent personally by me on the following activities:  Development of treatment plan with patient or surrogate, discussions with consultants, evaluation of patient's response to treatment, examination of patient, ordering and review of laboratory studies, ordering and review of radiographic studies, ordering and performing treatments and interventions, pulse oximetry, re-evaluation of patient's condition and review of old charts   I assumed direction of critical care for this patient from another provider in my specialty: no     Care discussed with: admitting provider       Medications Ordered in ED Medications  insulin regular, human (MYXREDLIN) 100  units/ 100 mL infusion (14 Units/hr Intravenous Rate/Dose Verify 04/05/22 1300)  lactated ringers infusion ( Intravenous New Bag/Given 04/05/22 1144)  dextrose 5 % in lactated ringers infusion (has no administration in time range)  dextrose 50 % solution 0-50 mL (has no administration in time range)  potassium chloride 10 mEq in 100 mL IVPB (10 mEq Intravenous New Bag/Given 04/05/22 1242)  nicotine (NICODERM CQ - dosed in mg/24 hours) patch 14 mg (14 mg Transdermal Patch Applied 04/05/22 1204)  cefTRIAXone (ROCEPHIN) 1 g in sodium chloride 0.9 % 100 mL IVPB (1 g Intravenous New Bag/Given 04/05/22 1250)  azithromycin (ZITHROMAX) 500 mg in sodium chloride 0.9 % 250 mL IVPB (has no administration in time range)  sodium chloride 0.9 % bolus 1,000 mL (0 mLs Intravenous Stopped 04/05/22 1043)  morphine (PF) 4 MG/ML injection 4 mg (4 mg Intravenous Given 04/05/22 1003)  sodium chloride 0.9 % bolus 1,000 mL (0 mLs Intravenous Stopped 04/05/22 1146)  iohexol (OMNIPAQUE) 300 MG/ML solution 100 mL (75 mLs Intravenous Contrast Given 04/05/22 1114)    ED Course/ Medical Decision Making/ A&P Clinical Course as of 04/05/22 1313  Wed Apr 05, 2022  1203 Consulted neurology Dr.Lindzen regarding patient's diffuse weakness beginning in the lower extremities as well as lack of DTRs.  He recommended against workup for Guillain-Barr given patient's current status concerning for HHS. [CR]  [place] Dr. Myriam Jacobson of [facility] hospitalist regarding the patient.  Agreed with admission of the patient assume further treatment/care. [CR]    Clinical Course User Index [CR] Wilnette Kales, PA                             Medical Decision Making Amount and/or Complexity of Data Reviewed Labs: ordered. Radiology: ordered.  Risk OTC drugs. Prescription drug management. Decision regarding hospitalization.   This patient presents to the ED for concern of generalized weakness, shortness of breath, this involves an extensive  number of treatment options, and is a complaint that carries with it a high risk of complications and morbidity.  The differential diagnosis includes myxedema, DKA/HHS/hyperglycemia, Guillain-Barr, hyponatremia, severe electrolyte abnormalities, CVA, MS, sepsis, pneumonia, anemia, COPD exacerbation, ACS, pneumothorax   Co morbidities that complicate the patient evaluation  See HPI   Additional history obtained:  Additional history  obtained from EMR External records from outside source obtained and reviewed including hospital records   Lab Tests:  I Ordered, and personally interpreted labs.  The pertinent results include: No leukocytosis.  Mild evidence of anemia with a hemoglobin of 12.70 which is near patient's baseline from prior laboratory studies performed.  Platelets within normal range.  Hyponatremia with a sodium of 118 of which when corrected for elevated glucose is around 135 or within normal range.  Decrease in chloride as well as bicarb of 86 and 21 respectively of which was supplemented via IV fluids.  No renal dysfunction.  No transaminitis.  Initial glucose of 1132 along with normal anion gap, lack of ketones in the urine concern for HHS.  UA significant for trace protein, greater than 1000 glucose.  VBG with normal pH.  Initial lactic of 3.3 with elevated lipase of 130.  Panel negative.  Osmolality pending.  Beta hydroxybutyric acid pending.  Occult blood negative.  TSH within normal range.  Magnesium within normal range.   Imaging Studies ordered:  I ordered imaging studies including chest x-ray, CT abdomen pelvis I independently visualized and interpreted imaging which showed  Chest x-ray: Right upper lobe opacity concerning for pneumonia CT abdomen pelvis: No acute abnormality in abdomen or pelvis.  Aortic atherosclerosis I agree with the radiologist interpretation  Cardiac Monitoring: / EKG:  The patient was maintained on a cardiac monitor.  I personally viewed and  interpreted the cardiac monitored which showed an underlying rhythm of: Sinus rhythm with mild ST elevation in inferior, anterior and some lateral leads with no reciprocal changes.   Consultations Obtained:  I requested consultation with attending physician Dr. Adekunle Qualia who is in agreement with treatment plan going forward.  Problem List / ED Course / Critical interventions / Medication management  HHS/community-acquired pneumonia I ordered medication including 2 L normal saline, regular insulin, potassium for corrections of patient's hyperglycemia.  Azithromycin and Rocephin for community-acquired pneumonia coverage.  Morphine for pain.    Reevaluation of the patient after these medicines showed that the patient improved I have reviewed the patients home medicines and have made adjustments as needed   Social Determinants of Health:  Chronic cigarette use   Test / Admission - Considered:  HHS/community-acquired pneumonia Vitals signs significant for hypertension with blood pressure of greater than 160/80 throughout stay. Otherwise within normal range and stable throughout visit. Laboratory/imaging studies significant for: See above Patient with laboratory studies concerning for HHS as well as evidence of community-acquired pneumonia.  Patient's diffuse weakness most likely attributable to current condition.  Some initial concern for Guillain-Barr syndrome given symptoms consistent with gastroenteritis and patient's complaints of weakness and lack of DTRs on physical exam; case was discussed with neurology Dr. Cheral Marker who suggested withholding workup for Guillain-Barr given patient's current state of HHS given more likely culprit.  If persists after correction, could be pursued for rule out.  Discussed case with attending physician Dr. Lucky Qualia who is agreement with treatment plan going forward.  HHS order set was used for appropriate labs and medical therapies.  Patient with also evidence of  community-acquired pneumonia of which broad-spectrum antibiotics were begun on the emergency department.  Patient deemed to meet admission criteria given current condition.  Treatment plan discussed at length with patient and he acknowledged understanding was agreeable to said plan. Treatment plan were discussed at length with patient and they knowledge understanding was agreeable to said plan.  Appropriate consultations were made as described in the ED course.  Patient was stable upon admission to the hospital.         Final Clinical Impression(s) / ED Diagnoses Final diagnoses:  Type 2 diabetes mellitus with hyperosmolar hyperglycemic state (HHS) ([place])  Community acquired pneumonia of right upper lobe of lung    Rx / DC Orders ED Discharge Orders     None         Wilnette Kales, [place] 04/05/22 1313    Tegeler, Gwenyth Allegra, MD 04/05/22 1441

## 2022-04-05 NOTE — ED Notes (Signed)
Hemocult card left in room... Provider aware.

## 2022-04-06 ENCOUNTER — Other Ambulatory Visit (HOSPITAL_COMMUNITY): Payer: Self-pay | Admitting: Psychiatry

## 2022-04-06 DIAGNOSIS — F4312 Post-traumatic stress disorder, chronic: Secondary | ICD-10-CM

## 2022-04-27 ENCOUNTER — Ambulatory Visit: Payer: Medicare HMO | Admitting: Neurology
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
## 2022-05-03 ENCOUNTER — Other Ambulatory Visit (HOSPITAL_COMMUNITY): Payer: Self-pay | Admitting: Psychiatry

## 2022-05-03 DIAGNOSIS — F4312 Post-traumatic stress disorder, chronic: Secondary | ICD-10-CM

## 2022-05-23 ENCOUNTER — Encounter (HOSPITAL_COMMUNITY): Payer: Self-pay | Admitting: Internal Medicine

## 2022-05-23 ENCOUNTER — Emergency Department (HOSPITAL_COMMUNITY): Payer: Medicare HMO

## 2022-05-23 ENCOUNTER — Other Ambulatory Visit: Payer: Self-pay

## 2022-05-23 ENCOUNTER — Inpatient Hospital Stay (HOSPITAL_COMMUNITY)
Admission: EM | Admit: 2022-05-23 | Discharge: 2022-05-27 | DRG: 638 | Disposition: A | Discharge status: Home / Self Care | Payer: Medicare HMO | Attending: Internal Medicine | Admitting: Internal Medicine

## 2022-05-23 ENCOUNTER — Observation Stay (HOSPITAL_COMMUNITY): Payer: Medicare HMO

## 2022-05-23 DIAGNOSIS — K219 Gastro-esophageal reflux disease without esophagitis: Secondary | ICD-10-CM | POA: Diagnosis present

## 2022-05-23 DIAGNOSIS — F4310 Post-traumatic stress disorder, unspecified: Secondary | ICD-10-CM | POA: Diagnosis present

## 2022-05-23 DIAGNOSIS — R739 Hyperglycemia, unspecified: Secondary | ICD-10-CM | POA: Diagnosis not present

## 2022-05-23 DIAGNOSIS — F3181 Bipolar II disorder: Secondary | ICD-10-CM | POA: Diagnosis not present

## 2022-05-23 DIAGNOSIS — E785 Hyperlipidemia, unspecified: Secondary | ICD-10-CM | POA: Diagnosis present

## 2022-05-23 DIAGNOSIS — E782 Mixed hyperlipidemia: Secondary | ICD-10-CM

## 2022-05-23 DIAGNOSIS — Z87820 Personal history of traumatic brain injury: Secondary | ICD-10-CM

## 2022-05-23 DIAGNOSIS — N179 Acute kidney failure, unspecified: Secondary | ICD-10-CM | POA: Diagnosis present

## 2022-05-23 DIAGNOSIS — E876 Hypokalemia: Secondary | ICD-10-CM | POA: Diagnosis present

## 2022-05-23 DIAGNOSIS — E119 Type 2 diabetes mellitus without complications: Secondary | ICD-10-CM

## 2022-05-23 DIAGNOSIS — Z91148 Patient's other noncompliance with medication regimen for other reason: Secondary | ICD-10-CM

## 2022-05-23 DIAGNOSIS — Z882 Allergy status to sulfonamides status: Secondary | ICD-10-CM

## 2022-05-23 DIAGNOSIS — N400 Enlarged prostate without lower urinary tract symptoms: Secondary | ICD-10-CM | POA: Diagnosis not present

## 2022-05-23 DIAGNOSIS — I2510 Atherosclerotic heart disease of native coronary artery without angina pectoris: Secondary | ICD-10-CM | POA: Diagnosis present

## 2022-05-23 DIAGNOSIS — F419 Anxiety disorder, unspecified: Secondary | ICD-10-CM | POA: Diagnosis present

## 2022-05-23 DIAGNOSIS — R29898 Other symptoms and signs involving the musculoskeletal system: Secondary | ICD-10-CM

## 2022-05-23 DIAGNOSIS — Z881 Allergy status to other antibiotic agents status: Secondary | ICD-10-CM

## 2022-05-23 DIAGNOSIS — E1100 Type 2 diabetes mellitus with hyperosmolarity without nonketotic hyperglycemic-hyperosmolar coma (NKHHC): Secondary | ICD-10-CM | POA: Diagnosis not present

## 2022-05-23 DIAGNOSIS — E1142 Type 2 diabetes mellitus with diabetic polyneuropathy: Secondary | ICD-10-CM | POA: Diagnosis present

## 2022-05-23 DIAGNOSIS — Z89022 Acquired absence of left finger(s): Secondary | ICD-10-CM

## 2022-05-23 DIAGNOSIS — Z9049 Acquired absence of other specified parts of digestive tract: Secondary | ICD-10-CM

## 2022-05-23 DIAGNOSIS — Z8249 Family history of ischemic heart disease and other diseases of the circulatory system: Secondary | ICD-10-CM

## 2022-05-23 DIAGNOSIS — R531 Weakness: Secondary | ICD-10-CM | POA: Diagnosis not present

## 2022-05-23 DIAGNOSIS — Z720 Tobacco use: Secondary | ICD-10-CM | POA: Diagnosis present

## 2022-05-23 DIAGNOSIS — Z888 Allergy status to other drugs, medicaments and biological substances status: Secondary | ICD-10-CM

## 2022-05-23 DIAGNOSIS — K449 Diaphragmatic hernia without obstruction or gangrene: Secondary | ICD-10-CM | POA: Diagnosis present

## 2022-05-23 DIAGNOSIS — Z794 Long term (current) use of insulin: Secondary | ICD-10-CM

## 2022-05-23 DIAGNOSIS — E860 Dehydration: Secondary | ICD-10-CM | POA: Diagnosis present

## 2022-05-23 DIAGNOSIS — Z8673 Personal history of transient ischemic attack (TIA), and cerebral infarction without residual deficits: Secondary | ICD-10-CM

## 2022-05-23 DIAGNOSIS — J449 Chronic obstructive pulmonary disease, unspecified: Secondary | ICD-10-CM | POA: Diagnosis present

## 2022-05-23 DIAGNOSIS — M069 Rheumatoid arthritis, unspecified: Secondary | ICD-10-CM | POA: Diagnosis present

## 2022-05-23 DIAGNOSIS — G629 Polyneuropathy, unspecified: Secondary | ICD-10-CM

## 2022-05-23 DIAGNOSIS — Z8739 Personal history of other diseases of the musculoskeletal system and connective tissue: Secondary | ICD-10-CM

## 2022-05-23 DIAGNOSIS — K3184 Gastroparesis: Secondary | ICD-10-CM | POA: Diagnosis present

## 2022-05-23 DIAGNOSIS — G894 Chronic pain syndrome: Secondary | ICD-10-CM | POA: Diagnosis present

## 2022-05-23 DIAGNOSIS — R200 Anesthesia of skin: Secondary | ICD-10-CM

## 2022-05-23 DIAGNOSIS — Z716 Tobacco abuse counseling: Secondary | ICD-10-CM

## 2022-05-23 DIAGNOSIS — G4733 Obstructive sleep apnea (adult) (pediatric): Secondary | ICD-10-CM | POA: Diagnosis present

## 2022-05-23 DIAGNOSIS — E1143 Type 2 diabetes mellitus with diabetic autonomic (poly)neuropathy: Secondary | ICD-10-CM | POA: Diagnosis present

## 2022-05-23 DIAGNOSIS — Z9079 Acquired absence of other genital organ(s): Secondary | ICD-10-CM

## 2022-05-23 DIAGNOSIS — Z7902 Long term (current) use of antithrombotics/antiplatelets: Secondary | ICD-10-CM

## 2022-05-23 DIAGNOSIS — Z833 Family history of diabetes mellitus: Secondary | ICD-10-CM

## 2022-05-23 DIAGNOSIS — Z79899 Other long term (current) drug therapy: Secondary | ICD-10-CM

## 2022-05-23 DIAGNOSIS — I10 Essential (primary) hypertension: Secondary | ICD-10-CM | POA: Diagnosis present

## 2022-05-23 DIAGNOSIS — T383X6A Underdosing of insulin and oral hypoglycemic [antidiabetic] drugs, initial encounter: Secondary | ICD-10-CM | POA: Diagnosis present

## 2022-05-23 DIAGNOSIS — Z8551 Personal history of malignant neoplasm of bladder: Secondary | ICD-10-CM

## 2022-05-23 DIAGNOSIS — E039 Hypothyroidism, unspecified: Secondary | ICD-10-CM | POA: Diagnosis present

## 2022-05-23 DIAGNOSIS — Z885 Allergy status to narcotic agent status: Secondary | ICD-10-CM

## 2022-05-23 DIAGNOSIS — F17210 Nicotine dependence, cigarettes, uncomplicated: Secondary | ICD-10-CM | POA: Diagnosis present

## 2022-05-23 LAB — CBG MONITORING, ED
Glucose-Capillary: >600 mg/dL (ref 70–99)
Glucose-Capillary: >600 mg/dL (ref 70–99)
Glucose-Capillary: >600 mg/dL (ref 70–99)
Glucose-Capillary: 404 mg/dL — ABNORMAL HIGH (ref 70–99)
Glucose-Capillary: 481 mg/dL — ABNORMAL HIGH (ref 70–99)
Glucose-Capillary: 297 mg/dL — ABNORMAL HIGH (ref 70–99)

## 2022-05-23 LAB — CBC
HCT: 35.6 % — ABNORMAL LOW (ref 39.0–52.0)
Hemoglobin: 11.9 g/dL — ABNORMAL LOW (ref 13.0–17.0)
MCH: 28.7 pg (ref 26.0–34.0)
MCHC: 33.4 g/dL (ref 30.0–36.0)
MCV: 86 fL (ref 80.0–100.0)
Platelets: 238 10*3/uL (ref 150–400)
RBC: 4.14 MIL/uL — ABNORMAL LOW (ref 4.22–5.81)
RDW: 13.3 % (ref 11.5–15.5)
WBC: 6 10*3/uL (ref 4.0–10.5)
nRBC: 0 % (ref 0.0–0.2)

## 2022-05-23 LAB — BASIC METABOLIC PANEL
Anion gap: 11 (ref 5–15)
BUN: 17 mg/dL (ref 6–20)
CO2: 21 mmol/L — ABNORMAL LOW (ref 22–32)
Calcium: 8.2 mg/dL — ABNORMAL LOW (ref 8.9–10.3)
Chloride: 89 mmol/L — ABNORMAL LOW (ref 98–111)
Creatinine, Ser: 1.37 mg/dL — ABNORMAL HIGH (ref 0.61–1.24)
GFR, Estimated: >60 mL/min (ref >60)
Glucose, Bld: 1184 mg/dL (ref 70–99)
Potassium: 4.5 mmol/L (ref 3.5–5.1)
Sodium: 121 mmol/L — ABNORMAL LOW (ref 135–145)

## 2022-05-23 LAB — URINALYSIS, MICROSCOPIC (REFLEX)
Bacteria, UA: NONE SEEN
RBC / HPF: NONE SEEN RBC/hpf (ref 0–5)
Squamous Epithelial / HPF: NONE SEEN /HPF (ref 0–5)
WBC, UA: NONE SEEN WBC/hpf (ref 0–5)

## 2022-05-23 LAB — HEPATIC FUNCTION PANEL
ALT: 17 U/L (ref 0–44)
AST: 15 U/L (ref 15–41)
Albumin: 3.4 g/dL — ABNORMAL LOW (ref 3.5–5.0)
Alkaline Phosphatase: 107 U/L (ref 38–126)
Bilirubin, Direct: <0.1 mg/dL (ref 0.0–0.2)
Total Bilirubin: 0.5 mg/dL (ref 0.3–1.2)
Total Protein: 6.3 g/dL — ABNORMAL LOW (ref 6.5–8.1)

## 2022-05-23 LAB — GLUCOSE, CAPILLARY: Glucose-Capillary: 237 mg/dL — ABNORMAL HIGH (ref 70–99)

## 2022-05-23 LAB — URINALYSIS, ROUTINE W REFLEX MICROSCOPIC
Bilirubin Urine: NEGATIVE
Glucose, UA: >=500 mg/dL — AB
Ketones, ur: NEGATIVE mg/dL
Leukocytes,Ua: NEGATIVE
Nitrite: NEGATIVE
Protein, ur: NEGATIVE mg/dL
Specific Gravity, Urine: <1.005 — ABNORMAL LOW (ref 1.005–1.030)
pH: 6 (ref 5.0–8.0)

## 2022-05-23 LAB — LIPASE, BLOOD: Lipase: 94 U/L — ABNORMAL HIGH (ref 11–51)

## 2022-05-23 MED ORDER — KCL IN DEXTROSE-NACL 10-5-0.45 MEQ/L-%-% IV SOLN
INTRAVENOUS | Status: DC
  Filled 2022-05-23 (×2): qty 1000

## 2022-05-23 MED ORDER — LACTATED RINGERS IV BOLUS
1000.0000 mL | Freq: Once | INTRAVENOUS | Status: AC
  Administered 2022-05-23: 1000 mL via INTRAVENOUS

## 2022-05-23 MED ORDER — CLOPIDOGREL BISULFATE 75 MG PO TABS
75.0000 mg | ORAL_TABLET | Freq: Every day | ORAL | Status: DC
  Administered 2022-05-24 – 2022-05-27 (×4): 75 mg via ORAL
  Filled 2022-05-23 (×4): qty 1

## 2022-05-23 MED ORDER — TAMSULOSIN HCL 0.4 MG PO CAPS
0.8000 mg | ORAL_CAPSULE | Freq: Every day | ORAL | Status: DC
  Administered 2022-05-25 – 2022-05-26 (×3): 0.8 mg via ORAL
  Filled 2022-05-23 (×3): qty 2

## 2022-05-23 MED ORDER — VENLAFAXINE HCL ER 75 MG PO CP24: 150.0000 mg | ORAL_CAPSULE | Freq: Every day | ORAL | Status: DC

## 2022-05-23 MED ORDER — LURASIDONE HCL 40 MG PO TABS
80.0000 mg | ORAL_TABLET | Freq: Every day | ORAL | Status: DC
  Administered 2022-05-25 – 2022-05-27 (×3): 80 mg via ORAL
  Filled 2022-05-23 (×3): qty 2

## 2022-05-23 MED ORDER — ONDANSETRON HCL 4 MG/2ML IJ SOLN: 4.0000 mg | Freq: Four times a day (QID) | INTRAMUSCULAR | Status: DC | PRN

## 2022-05-23 MED ORDER — ROSUVASTATIN CALCIUM 10 MG PO TABS
10.0000 mg | ORAL_TABLET | Freq: Every day | ORAL | Status: DC
  Administered 2022-05-24 – 2022-05-27 (×4): 10 mg via ORAL
  Filled 2022-05-23 (×5): qty 1

## 2022-05-23 MED ORDER — LACTATED RINGERS IV SOLN: INTRAVENOUS | Status: DC

## 2022-05-23 MED ORDER — ACETAMINOPHEN 650 MG RE SUPP: 650.0000 mg | Freq: Four times a day (QID) | RECTAL | Status: DC | PRN

## 2022-05-23 MED ORDER — SODIUM CHLORIDE 0.45 % IV SOLN
INTRAVENOUS | Status: DC
  Filled 2022-05-23: qty 1000

## 2022-05-23 MED ORDER — DULOXETINE HCL 60 MG PO CPEP
60.0000 mg | ORAL_CAPSULE | Freq: Every day | ORAL | Status: DC
  Administered 2022-05-24 – 2022-05-27 (×4): 60 mg via ORAL
  Filled 2022-05-23: qty 2
  Filled 2022-05-23: qty 1
  Filled 2022-05-23 (×2): qty 2

## 2022-05-23 MED ORDER — FENOFIBRATE 160 MG PO TABS
160.0000 mg | ORAL_TABLET | Freq: Every day | ORAL | Status: DC
  Administered 2022-05-24 – 2022-05-27 (×4): 160 mg via ORAL
  Filled 2022-05-23 (×4): qty 1

## 2022-05-23 MED ORDER — MELATONIN 3 MG PO TABS
3.0000 mg | ORAL_TABLET | Freq: Every evening | ORAL | Status: DC | PRN
  Administered 2022-05-25: 3 mg via ORAL
  Filled 2022-05-23: qty 1

## 2022-05-23 MED ORDER — DEXTROSE 50 % IV SOLN: 0.0000 mL | INTRAVENOUS | Status: DC | PRN

## 2022-05-23 MED ORDER — DEXTROSE IN LACTATED RINGERS 5 % IV SOLN: INTRAVENOUS | Status: DC

## 2022-05-23 MED ORDER — INSULIN REGULAR(HUMAN) IN NACL 100-0.9 UT/100ML-% IV SOLN
INTRAVENOUS | Status: DC
  Administered 2022-05-23: 13 [IU]/h via INTRAVENOUS
  Filled 2022-05-23: qty 100

## 2022-05-23 MED ORDER — CHLORHEXIDINE GLUCONATE CLOTH 2 % EX PADS
6.0000 | MEDICATED_PAD | Freq: Every day | CUTANEOUS | Status: DC
  Administered 2022-05-24 – 2022-05-27 (×5): 6 via TOPICAL

## 2022-05-23 MED ORDER — PANTOPRAZOLE SODIUM 40 MG PO TBEC
40.0000 mg | DELAYED_RELEASE_TABLET | Freq: Every day | ORAL | Status: DC
  Administered 2022-05-24 – 2022-05-27 (×4): 40 mg via ORAL
  Filled 2022-05-23 (×4): qty 1

## 2022-05-23 MED ORDER — LAMOTRIGINE 25 MG PO TABS
150.0000 mg | ORAL_TABLET | Freq: Two times a day (BID) | ORAL | Status: DC
  Administered 2022-05-24 – 2022-05-27 (×7): 150 mg via ORAL
  Filled 2022-05-23 (×7): qty 2

## 2022-05-23 MED ORDER — ORAL CARE MOUTH RINSE: 15.0000 mL | OROMUCOSAL | Status: DC | PRN

## 2022-05-23 MED ORDER — NICOTINE 21 MG/24HR TD PT24: 21.0000 mg | MEDICATED_PATCH | Freq: Every day | TRANSDERMAL | Status: DC | PRN

## 2022-05-23 MED ORDER — ACETAMINOPHEN 325 MG PO TABS
650.0000 mg | ORAL_TABLET | Freq: Four times a day (QID) | ORAL | Status: DC | PRN
  Administered 2022-05-26: 650 mg via ORAL
  Filled 2022-05-23: qty 2

## 2022-05-23 NOTE — ED Provider Triage Note (Signed)
Emergency Medicine Provider Triage Evaluation Note  Vincent [name] , a 57 y.o. male  was evaluated in triage.  Pt complains of hyperglycemia. Went to PCP today, CBG was > 600. Has been feeling weak with diarrhea and lightheadedness. Reports compliance with insulin, average CBG at home around 350-400.   Review of Systems  Positive: Weakness, diarrhea, lightheadedness Negative: Abd pain  Physical Exam  BP 105/63 (BP Location: Left Arm)   Pulse 96   Temp 97.9 F (36.6 C) (Oral)   Resp 16   Ht 6' (1.829 m)   Wt 76.2 kg   SpO2 98%   BMI 22.78 kg/m  Gen:   Awake, no distress   Resp:  Normal effort  MSK:   Moves extremities without difficulty  Other:    Medical Decision Making  Medically screening exam initiated at 4:43 PM.  Appropriate orders placed.  Vincent [name] was informed that the remainder of the evaluation will be completed by another provider, this initial triage assessment does not replace that evaluation, and the importance of remaining in the ED until their evaluation is complete.  Workup initiated. CBG > 600 in triage, given water to drink   Tahirih Lair T, PA-C 05/23/22 1644

## 2022-05-23 NOTE — H&P (Signed)
History and Physical      Vincent [name] [id number] DOB: 06-08-1965 DOA: 05/23/2022  PCP: Hayden Rasmussen, MD  Patient coming from: home   I have personally briefly reviewed patient's old medical records in [place]  Chief Complaint: Generalized weakness  HPI: Vincent [name] is a 57 y.o. male with medical history significant for breath controlled type 2 diabetes mellitus, bipolar type II, essential hypertension, hyperlipidemia, who is admitted to [facility] on 05/23/2022 with hyperglycemia after presenting from home to [facility] ED complaining of generalized weakness.   The patient reports 1 week of progressive generalized weakness, in the absence of any acute focal weakness.  He acknowledges a history of underlying type 2 diabetes mellitus, and reports very infrequent use of his basal insulin as well as a short acting insulin as an outpatientn over the last month, he also notes progressive polyuria, polydipsia.  Denies any associated subjective fever, chills, rigors, generalized myalgias.  No recent chest pain, shortness of breath, nausea, vomiting, palpitations, diaphoresis, dizziness, presyncope, or syncope.   Not associated with any recent headache, neck stiffness, cough, rash, dysuria, gross materia, abdominal pain, or diarrhea.  Pressure review, most recent hemoglobin A1c was found to be 14.1% in May 2023 patient's diabetes noted to be complicated by diabetic peripheral polyneuropathy for which she is  prescribed Lyrica.    ED Course:  Vital signs in the ED were notable for the following: Afebrile; initial heart rates in the 90s, socially decreasing to the 80s following interval initiation of IV fluids, sore throat of the below; systolic blood pressures initially in the low 100s, essentially increasing into the 140s following interval IV fluids; respiratory rate 16, oxygen saturation 97 to 90% on room air.  Labs were notable for the following: CMP notable for the  following: Sodium 121, which corrects to approximately 38 when taken to account concomitant hyperglycemia, potassium 4.5, bicarbonate 21, and gap 11, creatinine 1.37 compared to most recent prior serum creatinine data point of 1.04 on 04/05/2022, glucose 1184, with corresponding CBG greater than 600, calcium, adjusted for mild hypokalemia noted to be 8.6, abdomen 3.4, liver enzymes within normal limits.  Serum osmolality ordered, others are currently pending.  CBC notable for white cell count 6000, hemoglobin 11.9 associated normocytic/recurrent properties as well as nonelevated RDW, and relative to most recent prior hemoglobin did point of 12.2 on 04/05/2022, with a count 238.  Urinalysis notable for no white blood cells, leukocyte esterase, and nitrite negative, protein negative, no RBCs, trace hemoglobin, and negative ketones.  In the setting of generalized weakness, EDP ordered noncontrast CT head which showed no evidence of acute intracranial abnormality, including no evidence of acute infarct or any evidence of acute intracranial hemorrhage.  While in the ED, the following were administered: Insulin drip per Endo tool; lactated Ringer's times other bolus followed by initiation continuous LR running at 125 cc/h.   Subsequently, the patient was admitted for further evaluation and management presenting hyperglycemia, in the absence of evidence of DKA, with serum osmolality currently pending with presenting labs also notable for acute kidney injury, and  presenting complaint of generalized weakness.    Review of Systems: As per HPI otherwise 10 point review of systems negative.   Past Medical History:  Diagnosis Date   Anxiety    Benign essential tremor    Bipolar 2 disorder ([place])    followed by [facility]--- dr s. Adele Schilder   Bladder cancer [facility])    recurrent   CAD (  coronary artery disease)    cardiac cath 2003  and 2011 both showed normal coronary arteries w/ preserved lvf;  Non obstructive on CTA Oct  2019.    Chronic pain syndrome    back---- [facility] [place]   Cold extremities    BLE   COPD (chronic obstructive pulmonary disease) ([place])    DDD (degenerative disc disease), lumbar    Diabetic peripheral neuropathy ([place])    Gastroparesis    followed by dr Henrene Pastor   GERD (gastroesophageal reflux disease)    Hiatal hernia    History of bladder cancer urologist-  previously dr Consuella Lose;  now dr gay   papillay TCC (Ta G1)  s/p TURBT and chemo instillation 2014   History of chest pain 12/2017   heart cath normal   History of encephalopathy 05/27/2015   admission w/ acute encephalopathy thought to be secondary to pain meds and COPD   History of gastric ulcer    History of Helicobacter pylori infection    History of kidney stones    History of TIA (transient ischemic attack) 2008  and 10-19-2018    no residual's   History of traumatic head injury 2010   w/ LOC  per pt needed stitches, hit in head with a mower blade   Hyperlipidemia    Hypertension    Hypogonadism male    s/p  bilateral orchiectomy   Hypothyroidism    Insomnia    Mild obstructive sleep apnea    study in epic 12-04-2016, no cpap   PTSD (post-traumatic stress disorder)    chronic   PTSD (post-traumatic stress disorder)    RA (rheumatoid arthritis) ([place])    followed by [facility]   Seizures, transient [facility]) neurologist-  dr Krista Blue--  differential dx complex partial seizure .vs.  mood disorder .vs.  pseudoseizure--  negative EEG's   confusion episodes and staring spells since 11/ 2015   (03-26-2020 per pt wife last seizure 10 /2021)   Transient confusion NEUOROLOGIST-  DR Krista Blue   Episodes since 11/ 2015--  neurologist dx  differential complex partial seizure  .vs. mood disorder . vs. pseudoseizure   Type 2 diabetes mellitus treated with insulin [facility])    endocrinologist--- dr Loanne Drilling---  (03-26-2020 pt does not check blood sugar at home)    Past Surgical History:  Procedure Laterality  Date   AMPUTATION Left 04/28/2020   Procedure: LEFT LITTLE FINGER AMPUTATION;  Surgeon: Newt Minion, MD;  Location: [place];  Service: Orthopedics;  Laterality: Left;   CARDIAC CATHETERIZATION  12-27-2001  DR Einar Gip  &  05-26-2009  DR Irish Lack   RESULTS FOR BOTH ARE NORMAL CORONARIES AND PERSERVED LVF/ EF 60%   CARPAL TUNNEL RELEASE Bilateral right 09-16-2003;  left ?   CARPAL TUNNEL RELEASE Left 02/25/2015   Procedure: LEFT CARPAL TUNNEL RELEASE;  Surgeon: Leanora Cover, MD;  Location: [place];  Service: Orthopedics;  Laterality: Left;   CYSTOSCOPY N/A 10/10/2012   Procedure: CYSTOSCOPY CLOT EVACUATION FULGERATION OF BLEEDERS ;  Surgeon: Claybon Jabs, MD;  Location: [facility];  Service: Urology;  Laterality: N/A;   CYSTOSCOPY WITH BIOPSY N/A 11/26/2015   Procedure: CYSTOSCOPY WITH BIOPSY AND FULGURATION;  Surgeon: Kathie Rhodes, MD;  Location: [place];  Service: Urology;  Laterality: N/A;   ESOPHAGOGASTRODUODENOSCOPY  2014   LAPAROSCOPIC CHOLECYSTECTOMY  11-17-2010   ORCHIECTOMY Right 02/21/2016   Procedure: SCROTAL ORCHIECTOMY with TESTICULAR PROSTHESIS IMPLANT;  Surgeon: Kathie Rhodes,  MD;  Location: [place];  Service: Urology;  Laterality: Right;   ORCHIECTOMY Left 09/02/2018   Procedure: ORCHIECTOMY;  Surgeon: Kathie Rhodes, MD;  Location: [facility];  Service: Urology;  Laterality: Left;   ROTATOR CUFF REPAIR Right 12/2004   TRANSURETHRAL RESECTION OF BLADDER TUMOR N/A 08/09/2012   Procedure: TRANSURETHRAL RESECTION OF BLADDER TUMOR (TURBT) WITH GYRUS WITH MITOMYCIN C;  Surgeon: Claybon Jabs, MD;  Location: [facility];  Service: Urology;  Laterality: N/A;   TRANSURETHRAL RESECTION OF BLADDER TUMOR N/A 03/29/2020   Procedure: TRANSURETHRAL RESECTION OF BLADDER TUMOR (TURBT) and post-op instillation of gemcitabine;  Surgeon: Janith Lima, MD;  Location: [facility];  Service:  Urology;  Laterality: N/A;   TRANSURETHRAL RESECTION OF BLADDER TUMOR WITH GYRUS (TURBT-GYRUS) N/A 02/27/2014   Procedure: TRANSURETHRAL RESECTION OF BLADDER TUMOR WITH GYRUS (TURBT-GYRUS);  Surgeon: Claybon Jabs, MD;  [facility];  Service: Urology;  Laterality: N/A;    Social History:  reports that he has been smoking cigarettes. He has a 57.00 pack-year smoking history. He has never used smokeless tobacco. He reports that he does not drink alcohol and does not use drugs.   Allergies  Allergen Reactions   Celecoxib Anaphylaxis, Rash, Swelling and Other (See Comments)    Tongue swells   Hydrocodone Other (See Comments) and Rash    "blisters developed on arms"  "blisters developed on arms", "blisters developed on arms"   Sulfa Antibiotics Rash   Sulfacetamide Sodium Rash    Family History  Problem Relation Age of Onset   Diabetes Mother    Diabetes Father    Hypertension Father    Heart attack Father 16       died age 61   Alcohol abuse Father    Colon cancer Neg Hx    Esophageal cancer Neg Hx    Stomach cancer Neg Hx    Rectal cancer Neg Hx     Family history reviewed and not pertinent    Prior to Admission medications   Medication Sig Start Date End Date Taking? Authorizing Provider  acetaminophen (TYLENOL) 500 MG tablet Take 1,000 mg by mouth every 6 (six) hours as needed for moderate pain or headache.    [provider]  amoxicillin-clavulanate (AUGMENTIN) 875-125 MG tablet Take 1 tablet by mouth every 12 (twelve) hours. 04/05/22   Wilnette Kales, PA  azithromycin (ZITHROMAX) 250 MG tablet Take 1 tablet (250 mg total) by mouth daily. Take first 2 tablets together, then 1 every day until finished. 04/05/22   Wilnette Kales, PA  clopidogrel (PLAVIX) 75 MG tablet Take 1 tablet (75 mg total) by mouth daily. 10/12/21   Suzzanne Cloud, NP  DULoxetine (CYMBALTA) 60 MG capsule Take 1 capsule (60 mg total) by mouth daily. 10/12/21   Suzzanne Cloud, NP  fenofibrate 160 MG tablet Take 160 mg by mouth daily. 12/31/19   [provider]  HUMALOG KWIKPEN 200 UNIT/ML KwikPen Inject 15 Units into the skin in the morning and at bedtime. 06/17/21   [provider]  lamoTRIgine (LAMICTAL) 150 MG tablet Take 1 tablet (150 mg total) by mouth 2 (two) times daily. 10/12/21   Suzzanne Cloud, NP  losartan (COZAAR) 25 MG tablet Take 25 mg by mouth daily. 03/14/22   [provider]  lurasidone (LATUDA) 80 MG TABS tablet Take 1 tablet (80 mg total) by mouth daily with breakfast. 01/25/22   Arfeen,  Arlyce Harman, MD  omeprazole (PRILOSEC) 40 MG capsule Take 40 mg by mouth daily. 08/23/21   [provider]  pregabalin (LYRICA) 200 MG capsule Take 200 mg by mouth 3 (three) times daily. 12/25/21   Hayden Rasmussen, MD  rosuvastatin (CRESTOR) 10 MG tablet Take 1 tablet (10 mg total) by mouth daily. 08/04/21 04/05/22  Briant Cedar, MD  tamsulosin (FLOMAX) 0.4 MG CAPS capsule Take 0.8 mg by mouth at bedtime.    [provider]  testosterone cypionate (DEPOTESTOSTERONE CYPIONATE) 200 MG/ML injection Inject 200 mg into the skin every 14 (fourteen) days. 07/03/21   [provider]  TOUJEO SOLOSTAR 300 UNIT/ML Solostar Pen Inject into the skin. Pt uses 1 shot a week pre-filled pen 10/11/21   [provider]  traZODone (DESYREL) 150 MG tablet Take 1 tablet (150 mg total) by mouth at bedtime as needed for sleep. 01/25/22   Arfeen, Arlyce Harman, MD  venlafaxine XR (EFFEXOR-XR) 150 MG 24 hr capsule Take 1 capsule (150 mg total) by mouth daily with breakfast. 01/25/22   Arfeen, Arlyce Harman, MD  Vitamin D, Ergocalciferol, (DRISDOL) 1.25 MG (50000 UNIT) CAPS capsule Take 50,000 Units by mouth every Wednesday. 12/26/19   [provider]     Objective    Physical Exam: Vitals:   05/23/22 1800 05/23/22 1900 05/23/22 2040 05/23/22 2223  BP: (!) 144/68 (!) 173/79  117/73  Pulse: 88 87  82  Resp:  16  16  Temp:   98.3  F (36.8 C) 98.2 F (36.8 C)  TempSrc:   Oral   SpO2: 97% 98%  97%  Weight:      Height:        General: appears to be stated age; alert, oriented Skin: warm, dry, no rash Head:  AT/[place] Mouth:  Oral mucosa membranes appear dry, normal dentition Neck: supple; trachea midline Heart:  RRR; did not appreciate any M/R/G Lungs: CTAB, did not appreciate any wheezes, rales, or rhonchi Abdomen: + BS; soft, ND, NT Vascular: 2+ pedal pulses b/l; 2+ radial pulses b/l Extremities: no peripheral edema, no muscle wasting Neuro: strength and sensation intact in upper and lower extremities b/l    Labs on Admission: I have personally reviewed following labs and imaging studies  CBC: Recent Labs  Lab 05/23/22 1711  WBC 6.0  HGB 11.9*  HCT 35.6*  MCV 86.0  PLT 99991111   Basic Metabolic Panel: Recent Labs  Lab 05/23/22 1711  NA 121*  K 4.5  CL 89*  CO2 21*  GLUCOSE 1,184*  BUN 17  CREATININE 1.37*  CALCIUM 8.2*   GFR: Estimated Creatinine Clearance: 66.4 mL/min (A) (by C-G formula based on SCr of 1.37 mg/dL (H)). Liver Function Tests: Recent Labs  Lab 05/23/22 1711  AST 15  ALT 17  ALKPHOS 107  BILITOT 0.5  PROT 6.3*  ALBUMIN 3.4*   Recent Labs  Lab 05/23/22 1711  LIPASE 94*   No results for input(s): "AMMONIA" in the last 168 hours. Coagulation Profile: No results for input(s): "INR", "PROTIME" in the last 168 hours. Cardiac Enzymes: No results for input(s): "CKTOTAL", "CKMB", "CKMBINDEX", "TROPONINI" in the last 168 hours. BNP (last 3 results) No results for input(s): "PROBNP" in the last 8760 hours. HbA1C: No results for input(s): "HGBA1C" in the last 72 hours. CBG: Recent Labs  Lab 05/23/22 1959 05/23/22 2038 05/23/22 2136 05/23/22 2215 05/23/22 2303  GLUCAP >600* >600* 481* 404* 297*   Lipid Profile: No results for input(s): "CHOL", "HDL", "  [place]", "TRIG", "CHOLHDL", "LDLDIRECT" in the last 72 hours. Thyroid Function Tests: No results for input(s):  "TSH", "T4TOTAL", "FREET4", "T3FREE", "THYROIDAB" in the last 72 hours. Anemia Panel: No results for input(s): "VITAMINB12", "FOLATE", "FERRITIN", "TIBC", "IRON", "RETICCTPCT" in the last 72 hours. Urine analysis:    Component Value Date/Time   COLORURINE YELLOW 05/23/2022 1643   APPEARANCEUR CLEAR 05/23/2022 1643   LABSPEC <1.005 (L) 05/23/2022 1643   PHURINE 6.0 05/23/2022 1643   GLUCOSEU >=500 (A) 05/23/2022 1643   HGBUR TRACE (A) 05/23/2022 1643   BILIRUBINUR NEGATIVE 05/23/2022 1643   KETONESUR NEGATIVE 05/23/2022 1643   PROTEINUR NEGATIVE 05/23/2022 1643   UROBILINOGEN 1.0 03/04/2014 2312   NITRITE NEGATIVE 05/23/2022 1643   LEUKOCYTESUR NEGATIVE 05/23/2022 1643    Radiological Exams on Admission: CT Head Wo Contrast  Result Date: 05/23/2022 CLINICAL DATA:  Weakness, acute stroke suspected. EXAM: CT HEAD WITHOUT CONTRAST TECHNIQUE: Contiguous axial images were obtained from the base of the skull through the vertex without intravenous contrast. RADIATION DOSE REDUCTION: This exam was performed according to the departmental dose-optimization program which includes automated exposure control, adjustment of the mA and/or kV according to patient size and/or use of iterative reconstruction technique. COMPARISON:  None Available. FINDINGS: Brain: No acute intracranial hemorrhage. No focal mass lesion. No CT evidence of acute infarction. No midline shift or mass effect. No hydrocephalus. Basilar cisterns are patent. Vascular: No hyperdense vessel or unexpected calcification. Skull: Normal. Negative for fracture or focal lesion. Sinuses/Orbits: Paranasal sinuses and mastoid air cells are clear. Orbits are clear. Other: None. IMPRESSION: No acute intracranial findings.  No CT evidence of acute infarction. Electronically Signed   By: Suzy Bouchard M.D.   On: 05/23/2022 19:46      Assessment/Plan    Principal Problem:   Hyperglycemia Active Problems:   AKI (acute kidney injury) ([place])    Bipolar 2 disorder ([facility])   Hyperlipidemia   Tobacco abuse   DM2 (diabetes mellitus, type 2) ([facility])   Generalized weakness   Essential hypertension   BPH (benign prostatic hyperplasia)      #) Hyperglycemia: In the setting of a history of DM2, presenting blood sugar found to be nearly 1200, without clinical evidence of DKA given no corresponding anion gap metabolic acidosis, significant elevation in beta hydroxybutyric acid, or evidence of metabolic acidemia on blood gas.  Awaiting result of serum osmolality for further assessment for hyperglycemic hyperosmolar nonketotic state. No e/o associated coma. Of note, most recent hemoglobin A1c found to be 14.1% when checked in May 2023.  This is all in the setting of the patient's conveyance of poor outpatient compliance with both basal and short acting insulin.   Aside from aforementioned suboptimal compliance with the above outpatient diabetic regimen, no additional hyperglycemic contributing factors identified at this time. No clinical evidence to suggest underlying infxn at this time,  including urinalysis that was inconsistent with UTI.  Will also check chest x-ray to further assess for any underlying infectious contribution.  Additionally, ACS is felt to be less likely given no recent chest pain, but will also check EKG.     In the ED, insulin drip was initiated, and the following IVF administered: 2 L LR bolus. Notable additional presenting labs include presenting corrected serum sodium of 138 once corrected for hyperglycemia and serum potassium level of 4.5. In light of this potassium finding,  will transition IVF's at this time to 1/2NS with 10 mEq/L Kcl @ 125 cc/hr.     Plan: Insulin drip per  DM2 hyperglycemic Endotool protocol. Q1H cbg's. Q4H BMP's in order to monitor ensuing Na and potassium levels. NPO pending ensuing improvement in current degree of hyperglycemia. Transition IVF's to 1/2NS with 10 mEq/L Kcl @ 125 cc/hr, as serum potassium  level is less than 5.0. Once [facility] cbg's reflect serum glucose less than 250, will add D5 to existing IVF's. Check serum Mg and Phos levels, w/ prn supplementation per protocol.  Once the patient's glucose is under 200 and is tolerating p.o., will plan to initiate sq basal insulin, while continuing insulin drip for an additional two hours to prevent rebound hyperglycemia, before ultimately turning off insulin drip.  Monitor on telemetry. Monitor strict I's & O's.  inpatient consult to diabetic educator has been placed. Check chest x-ray, serum ethanol level, EKG, urinary drug screen.  Check updated hemoglobin A1c level.  The importance of improved compliance with outpatient diuretic regimen was emphasized to the patient.            #) Acute Kidney Injury: presenting creatinine  1.37 compared to most recent prior value of 1.04 on 04/05/2022. Appears to be prerenal in nature stemming from dehydration as a consequence of hyperglyemic-associated auto-diuresis, as above. UA without any significant urinary casts.  Potential exacerbating pharmacologic factors as an outpatient, include losartan as well as Lyrica, both of which will be held for now.  Plan: monitor strict I's & O's and daily weights. Attempt to avoid nephrotoxic agents. IVF's, as above. Check CPK level given trace hemoglobin in the absence of any RBCs. Repeat bmp in AM. Check serum mag level.  Hold home losartan and Lyrica, as above.          #) Generalized weakness: 1 month of progressive duration of generalized weakness in the absence of any acute focal neurologic deficits to suggest acute CVA, while noncontrast CT head performed today shows no evidence of acute intracranial process, as further detailed above.  Likely multifactorial in nature, with contributions from poorly controlled diabetes as well as resultant dehydration and acute kidney injury, as above.  No evidence of underlying infectious process at this time, as further  detailed above.    Plan: Further evaluation management of presenting hyperglycemia, including plan for IVF's, as further detailed above.  Fall precautions.  Check TSH, chest x-ray.  PT consult placed for the morning. Check CPK.  Repeat CMP, CBC in the morning.              #) Bipolar, type II disorder: Documented history of such, with outpatient mental health regimen that includes Latuda, Cymbalta, Lamictal.  Plan: Continue home Lamictal, Cymbalta, and Latuda.  Repeat CBC in the morning.               #) Hyperlipidemia: documented h/o such. On rosuvastatin as outpatient.   Plan: continue home statin.  Follow-up result of CPK level.             #) Essential Hypertension: documented h/o such, with outpatient antihypertensive regimen including losartan.  SBP's in the ED today: Low 100s to 140s mmHg.   Plan: Close monitoring of subsequent BP via routine VS. hold home losartan for now in the setting of concomitant presenting acute kidney injury.              #) Benign Prostatic Hyperplasia:  documented h/o such; on tamsulosin as outpatient.   Plan: monitor strict I's & O's and daily weights. Repeat CMP in AM.  Continue outpatient tamsulosin.            #)  Chronic tobacco abuse: Patient conveys that they are a current smoker, having smoked 1.5 ppd for nearly 40 years.   Plan: Counseled the patient for less than 2 minutes on the importance of complete smoking discontinuation.  Order placed for prn nicotine patch for use during this hospitalization.        DVT prophylaxis: SCD's   Code Status: Full code Family Communication: none Disposition Plan: Per Rounding Team Consults called: none;  Admission status: Observation    I SPENT GREATER THAN 75  MINUTES IN CLINICAL CARE TIME/MEDICAL DECISION-MAKING IN COMPLETING THIS ADMISSION.     [place] DO [facility] [place]   05/23/2022, 11:20 PM

## 2022-05-23 NOTE — ED Provider Notes (Signed)
[place] EMERGENCY DEPARTMENT AT [facility] Provider Note   CSN: [id number] Arrival date & time: 05/23/22  1607     History  Chief Complaint  Patient presents with   Hyperglycemia    Vincent [name] is a 57 y.o. male.  Patient is a 57 year old male with a past medical history of diabetes with medication noncompliance, hypertension and hyperlipidemia presenting to the emergency department with weakness.  Patient states that he has had increasing weakness over the last month where he has been having difficulty walking and getting around on his own at home.  He denies any trauma or falls.  He states he feels generally weak all over but also reports that he is having some paresthesias in his right arm and right leg.  He denies any recent nausea, vomiting or diarrhea but has reported decreased appetite.  He denies any chest or abdominal pain.  He states he was seen by his primary doctor today and recommended to come to the ER for further evaluation.  The history is provided by the patient and the spouse.  Hyperglycemia      Home Medications Prior to Admission medications   Medication Sig Start Date End Date Taking? Authorizing Provider  acetaminophen (TYLENOL) 500 MG tablet Take 1,000 mg by mouth every 6 (six) hours as needed for moderate pain or headache.    [provider]  amoxicillin-clavulanate (AUGMENTIN) 875-125 MG tablet Take 1 tablet by mouth every 12 (twelve) hours. 04/05/22   Wilnette Kales, PA  azithromycin (ZITHROMAX) 250 MG tablet Take 1 tablet (250 mg total) by mouth daily. Take first 2 tablets together, then 1 every day until finished. 04/05/22   Wilnette Kales, PA  clopidogrel (PLAVIX) 75 MG tablet Take 1 tablet (75 mg total) by mouth daily. 10/12/21   Suzzanne Cloud, NP  DULoxetine (CYMBALTA) 60 MG capsule Take 1 capsule (60 mg total) by mouth daily. 10/12/21   Suzzanne Cloud, NP  fenofibrate 160 MG tablet Take 160 mg by mouth daily. 12/31/19    [provider]  HUMALOG KWIKPEN 200 UNIT/ML KwikPen Inject 15 Units into the skin in the morning and at bedtime. 06/17/21   [provider]  lamoTRIgine (LAMICTAL) 150 MG tablet Take 1 tablet (150 mg total) by mouth 2 (two) times daily. 10/12/21   Suzzanne Cloud, NP  losartan (COZAAR) 25 MG tablet Take 25 mg by mouth daily. 03/14/22   [provider]  lurasidone (LATUDA) 80 MG TABS tablet Take 1 tablet (80 mg total) by mouth daily with breakfast. 01/25/22   Arfeen, Arlyce Harman, MD  omeprazole (PRILOSEC) 40 MG capsule Take 40 mg by mouth daily. 08/23/21   [provider]  pregabalin (LYRICA) 200 MG capsule Take 200 mg by mouth 3 (three) times daily. 12/25/21   Hayden Rasmussen, MD  rosuvastatin (CRESTOR) 10 MG tablet Take 1 tablet (10 mg total) by mouth daily. 08/04/21 04/05/22  Briant Cedar, MD  tamsulosin (FLOMAX) 0.4 MG CAPS capsule Take 0.8 mg by mouth at bedtime.    [provider]  testosterone cypionate (DEPOTESTOSTERONE CYPIONATE) 200 MG/ML injection Inject 200 mg into the skin every 14 (fourteen) days. 07/03/21   [provider]  TOUJEO SOLOSTAR 300 UNIT/ML Solostar Pen Inject into the skin. Pt uses 1 shot a week pre-filled pen 10/11/21   [provider]  traZODone (DESYREL) 150 MG tablet Take 1 tablet (150 mg total) by mouth at bedtime as needed for sleep. 01/25/22  Kathlee Nations, MD  venlafaxine XR (EFFEXOR-XR) 150 MG 24 hr capsule Take 1 capsule (150 mg total) by mouth daily with breakfast. 01/25/22   Arfeen, Arlyce Harman, MD  Vitamin D, Ergocalciferol, (DRISDOL) 1.25 MG (50000 UNIT) CAPS capsule Take 50,000 Units by mouth every Wednesday. 12/26/19   [provider]      Allergies    Celecoxib, Hydrocodone, Sulfa antibiotics, and Sulfacetamide sodium    Review of Systems   Review of Systems  Physical Exam Updated Vital Signs BP 117/73   Pulse 82   Temp 98.2 F (36.8 C)   Resp 16   Ht 6' (1.829 m)   Wt 76.2 kg    SpO2 97%   BMI 22.78 kg/m  Physical Exam Vitals and nursing note reviewed.  Constitutional:      General: He is not in acute distress.    Appearance: Normal appearance.  HENT:     Head: Normocephalic and atraumatic.     Nose: Nose normal.     Mouth/Throat:     Mouth: Mucous membranes are dry.     Pharynx: Oropharynx is clear.  Eyes:     Extraocular Movements: Extraocular movements intact.     Conjunctiva/sclera: Conjunctivae normal.     Pupils: Pupils are equal, round, and reactive to light.  Cardiovascular:     Rate and Rhythm: Normal rate and regular rhythm.     Pulses: Normal pulses.     Heart sounds: Normal heart sounds.  Pulmonary:     Effort: Pulmonary effort is normal.     Breath sounds: Normal breath sounds.  Abdominal:     General: Abdomen is flat.     Palpations: Abdomen is soft.     Tenderness: There is no abdominal tenderness.  Musculoskeletal:        General: Normal range of motion.     Cervical back: Normal range of motion and neck supple.     Right lower leg: No edema.     Left lower leg: No edema.  Skin:    General: Skin is warm and dry.  Neurological:     General: No focal deficit present.     Mental Status: He is alert and oriented to person, place, and time.     Cranial Nerves: No cranial nerve deficit.     Sensory: No sensory deficit (Subjectively decreased patient and right arm and right leg).     Motor: No weakness (No drift in all 4 extremities).     Coordination: Coordination normal.  Psychiatric:        Mood and Affect: Mood normal.        Behavior: Behavior normal.     ED Results / Procedures / Treatments   Labs (all labs ordered are listed, but only abnormal results are displayed) Labs Reviewed  BASIC METABOLIC PANEL - Abnormal; Notable for the following components:      Result Value   Sodium 121 (*)    Chloride 89 (*)    CO2 21 (*)    Glucose, Bld 1,184 (*)    Creatinine, Ser 1.37 (*)    Calcium 8.2 (*)    All other components  within normal limits  CBC - Abnormal; Notable for the following components:   RBC 4.14 (*)    Hemoglobin 11.9 (*)    HCT 35.6 (*)    All other components within normal limits  URINALYSIS, ROUTINE W REFLEX MICROSCOPIC - Abnormal; Notable for the following components:   Specific Gravity, Urine <  1.005 (*)    Glucose, UA >=500 (*)    Hgb urine dipstick TRACE (*)    All other components within normal limits  HEPATIC FUNCTION PANEL - Abnormal; Notable for the following components:   Total Protein 6.3 (*)    Albumin 3.4 (*)    All other components within normal limits  LIPASE, BLOOD - Abnormal; Notable for the following components:   Lipase 94 (*)    All other components within normal limits  CBG MONITORING, ED - Abnormal; Notable for the following components:   Glucose-Capillary >600 (*)    All other components within normal limits  CBG MONITORING, ED - Abnormal; Notable for the following components:   Glucose-Capillary >600 (*)    All other components within normal limits  CBG MONITORING, ED - Abnormal; Notable for the following components:   Glucose-Capillary >600 (*)    All other components within normal limits  CBG MONITORING, ED - Abnormal; Notable for the following components:   Glucose-Capillary 481 (*)    All other components within normal limits  URINALYSIS, MICROSCOPIC (REFLEX)  OSMOLALITY  CBC WITH DIFFERENTIAL/PLATELET  COMPREHENSIVE METABOLIC PANEL  MAGNESIUM  MAGNESIUM  PHOSPHORUS  BASIC METABOLIC PANEL  BASIC METABOLIC PANEL  BASIC METABOLIC PANEL    EKG None  Radiology CT Head Wo Contrast  Result Date: 05/23/2022 CLINICAL DATA:  Weakness, acute stroke suspected. EXAM: CT HEAD WITHOUT CONTRAST TECHNIQUE: Contiguous axial images were obtained from the base of the skull through the vertex without intravenous contrast. RADIATION DOSE REDUCTION: This exam was performed according to the departmental dose-optimization program which includes automated exposure  control, adjustment of the mA and/or kV according to patient size and/or use of iterative reconstruction technique. COMPARISON:  None Available. FINDINGS: Brain: No acute intracranial hemorrhage. No focal mass lesion. No CT evidence of acute infarction. No midline shift or mass effect. No hydrocephalus. Basilar cisterns are patent. Vascular: No hyperdense vessel or unexpected calcification. Skull: Normal. Negative for fracture or focal lesion. Sinuses/Orbits: Paranasal sinuses and mastoid air cells are clear. Orbits are clear. Other: None. IMPRESSION: No acute intracranial findings.  No CT evidence of acute infarction. Electronically Signed   By: Suzy Bouchard M.D.   On: 05/23/2022 19:46    Procedures .Critical Care  Performed by: Kemper Durie, DO Authorized by: Kemper Durie, DO   Critical care provider statement:    Critical care time (minutes):  40   Critical care time was exclusive of:  Separately billable procedures and treating other patients   Critical care was necessary to treat or prevent imminent or life-threatening deterioration of the following conditions:  Endocrine crisis   Critical care was time spent personally by me on the following activities:  Development of treatment plan with patient or surrogate, discussions with consultants, discussions with primary provider, evaluation of patient's response to treatment, examination of patient, obtaining history from patient or surrogate, ordering and performing treatments and interventions, ordering and review of laboratory studies, ordering and review of radiographic studies, pulse oximetry, re-evaluation of patient's condition and review of old charts   I assumed direction of critical care for this patient from another provider in my specialty: no     Care discussed with: admitting provider       Medications Ordered in ED Medications  insulin regular, human (MYXREDLIN) 100 units/ 100 mL infusion (7.5 Units/hr  Intravenous Rate/Dose Change 05/23/22 2138)  dextrose 50 % solution 0-50 mL (has no administration in time range)  acetaminophen (TYLENOL) tablet 650  mg (has no administration in time range)    Or  acetaminophen (TYLENOL) suppository 650 mg (has no administration in time range)  melatonin tablet 3 mg (has no administration in time range)  ondansetron (ZOFRAN) injection 4 mg (has no administration in time range)  sodium chloride 0.45 % 1,000 mL with potassium chloride 10 mEq infusion (has no administration in time range)  dextrose 5 % and 0.45 % NaCl with KCl 10 mEq/L infusion (has no administration in time range)  lactated ringers bolus 1,000 mL (1,000 mLs Intravenous New Bag/Given 05/23/22 1803)  lactated ringers bolus 1,000 mL (1,000 mLs Intravenous New Bag/Given 05/23/22 1950)    ED Course/ Medical Decision Making/ A&P Clinical Course as of 05/23/22 2255  Tue May 23, 2022  1910 Patient with extreme hyperglycemia, no AGAP making DKA unlikely and has normal mental status making HHS less likely. He will be started on insulin drip for hyperglycemic crisis and will require admission. [VK]  2031 Head CT negative, patient will be admitted to the hospitalist for hyperglycemic crisis. [VK]    Clinical Course User Index [VK] Kemper Durie, DO                             Medical Decision Making This patient presents to the ED with chief complaint(s) of weakness with pertinent past medical history of HTN, DM, medication non-compliance which further complicates the presenting complaint. The complaint involves an extensive differential diagnosis and also carries with it a high risk of complications and morbidity.    The differential diagnosis includes patient has subjectively decreased sensation in right arm and right leg concerning for possible subacute stroke or TIA, electrolyte abnormality, dehydration, anemia, infection  Additional history obtained: Additional history obtained from  spouse Records reviewed prior ED records  ED Course and Reassessment: On patient's arrival to the emergency department he did have a Accu-Chek performed that showed a glucose greater than 600.  Patient will have labs performed to evaluate for hyperglycemic crisis.  He will additionally have a head CT in the setting of his right-sided paresthesias to evaluate for possible CVA as a cause of his symptoms.  He will be started on IV fluids and will be closely reassessed.  Independent labs interpretation:  The following labs were independently interpreted: Hyperglycemia without DKA otherwise within normal range  Independent visualization of imaging: - I independently visualized the following imaging with scope of interpretation limited to determining acute life threatening conditions related to emergency care: CT head, which revealed no acute disease  Consultation: - Consulted or discussed management/test interpretation w/ external professional: Hospitalist  Consideration for admission or further workup: Patient requires admission for his hyperglycemic crisis Social Determinants of health: N/A    Amount and/or Complexity of Data Reviewed Labs: ordered. Radiology: ordered.  Risk Prescription drug management. Decision regarding hospitalization.          Final Clinical Impression(s) / ED Diagnoses Final diagnoses:  Hyperglycemia  Generalized weakness    Rx / DC Orders ED Discharge Orders     None         Kemper Durie, DO 05/23/22 2255

## 2022-05-23 NOTE — ED Triage Notes (Signed)
C/o feeling weak, diarrhea, and lightheaded today.  Went to PCP and sent to ER due to cbg-high.  Pt denies any missed doses of insulin.  Pt reports @ 1300 bg- 350 at home.  Hx IDDM.

## 2022-05-24 DIAGNOSIS — E039 Hypothyroidism, unspecified: Secondary | ICD-10-CM | POA: Diagnosis present

## 2022-05-24 DIAGNOSIS — E785 Hyperlipidemia, unspecified: Secondary | ICD-10-CM | POA: Diagnosis present

## 2022-05-24 DIAGNOSIS — N400 Enlarged prostate without lower urinary tract symptoms: Secondary | ICD-10-CM | POA: Diagnosis present

## 2022-05-24 DIAGNOSIS — J449 Chronic obstructive pulmonary disease, unspecified: Secondary | ICD-10-CM | POA: Diagnosis present

## 2022-05-24 DIAGNOSIS — K449 Diaphragmatic hernia without obstruction or gangrene: Secondary | ICD-10-CM | POA: Diagnosis present

## 2022-05-24 DIAGNOSIS — G4733 Obstructive sleep apnea (adult) (pediatric): Secondary | ICD-10-CM | POA: Diagnosis present

## 2022-05-24 DIAGNOSIS — I2510 Atherosclerotic heart disease of native coronary artery without angina pectoris: Secondary | ICD-10-CM | POA: Diagnosis present

## 2022-05-24 DIAGNOSIS — K3184 Gastroparesis: Secondary | ICD-10-CM | POA: Diagnosis present

## 2022-05-24 DIAGNOSIS — E1100 Type 2 diabetes mellitus with hyperosmolarity without nonketotic hyperglycemic-hyperosmolar coma (NKHHC): Secondary | ICD-10-CM | POA: Diagnosis present

## 2022-05-24 DIAGNOSIS — Z794 Long term (current) use of insulin: Secondary | ICD-10-CM | POA: Diagnosis not present

## 2022-05-24 DIAGNOSIS — E1143 Type 2 diabetes mellitus with diabetic autonomic (poly)neuropathy: Secondary | ICD-10-CM | POA: Diagnosis present

## 2022-05-24 DIAGNOSIS — F419 Anxiety disorder, unspecified: Secondary | ICD-10-CM | POA: Diagnosis present

## 2022-05-24 DIAGNOSIS — E1142 Type 2 diabetes mellitus with diabetic polyneuropathy: Secondary | ICD-10-CM | POA: Diagnosis present

## 2022-05-24 DIAGNOSIS — N179 Acute kidney failure, unspecified: Secondary | ICD-10-CM | POA: Diagnosis present

## 2022-05-24 DIAGNOSIS — F4310 Post-traumatic stress disorder, unspecified: Secondary | ICD-10-CM | POA: Diagnosis present

## 2022-05-24 DIAGNOSIS — G894 Chronic pain syndrome: Secondary | ICD-10-CM | POA: Diagnosis present

## 2022-05-24 DIAGNOSIS — R739 Hyperglycemia, unspecified: Secondary | ICD-10-CM | POA: Diagnosis present

## 2022-05-24 DIAGNOSIS — K219 Gastro-esophageal reflux disease without esophagitis: Secondary | ICD-10-CM | POA: Diagnosis present

## 2022-05-24 DIAGNOSIS — I10 Essential (primary) hypertension: Secondary | ICD-10-CM | POA: Diagnosis present

## 2022-05-24 DIAGNOSIS — F3181 Bipolar II disorder: Secondary | ICD-10-CM | POA: Diagnosis present

## 2022-05-24 DIAGNOSIS — Z8673 Personal history of transient ischemic attack (TIA), and cerebral infarction without residual deficits: Secondary | ICD-10-CM | POA: Diagnosis not present

## 2022-05-24 DIAGNOSIS — F17210 Nicotine dependence, cigarettes, uncomplicated: Secondary | ICD-10-CM | POA: Diagnosis present

## 2022-05-24 DIAGNOSIS — M069 Rheumatoid arthritis, unspecified: Secondary | ICD-10-CM | POA: Diagnosis present

## 2022-05-24 DIAGNOSIS — E860 Dehydration: Secondary | ICD-10-CM | POA: Diagnosis present

## 2022-05-24 DIAGNOSIS — E876 Hypokalemia: Secondary | ICD-10-CM | POA: Diagnosis present

## 2022-05-24 LAB — RAPID URINE DRUG SCREEN, HOSP PERFORMED
Amphetamines: NOT DETECTED
Barbiturates: NOT DETECTED
Benzodiazepines: NOT DETECTED
Cocaine: NOT DETECTED
Opiates: NOT DETECTED
Tetrahydrocannabinol: NOT DETECTED

## 2022-05-24 LAB — COMPREHENSIVE METABOLIC PANEL
ALT: 14 U/L (ref 0–44)
AST: 17 U/L (ref 15–41)
Albumin: 3.2 g/dL — ABNORMAL LOW (ref 3.5–5.0)
Alkaline Phosphatase: 78 U/L (ref 38–126)
Anion gap: 9 (ref 5–15)
BUN: 14 mg/dL (ref 6–20)
CO2: 24 mmol/L (ref 22–32)
Calcium: 8.8 mg/dL — ABNORMAL LOW (ref 8.9–10.3)
Chloride: 101 mmol/L (ref 98–111)
Creatinine, Ser: 0.92 mg/dL (ref 0.61–1.24)
GFR, Estimated: >60 mL/min (ref >60)
Glucose, Bld: 220 mg/dL — ABNORMAL HIGH (ref 70–99)
Potassium: 3.1 mmol/L — ABNORMAL LOW (ref 3.5–5.1)
Sodium: 134 mmol/L — ABNORMAL LOW (ref 135–145)
Total Bilirubin: 0.6 mg/dL (ref 0.3–1.2)
Total Protein: 5.8 g/dL — ABNORMAL LOW (ref 6.5–8.1)

## 2022-05-24 LAB — GLUCOSE, CAPILLARY
Glucose-Capillary: 137 mg/dL — ABNORMAL HIGH (ref 70–99)
Glucose-Capillary: 160 mg/dL — ABNORMAL HIGH (ref 70–99)
Glucose-Capillary: 181 mg/dL — ABNORMAL HIGH (ref 70–99)
Glucose-Capillary: 194 mg/dL — ABNORMAL HIGH (ref 70–99)
Glucose-Capillary: 204 mg/dL — ABNORMAL HIGH (ref 70–99)
Glucose-Capillary: 205 mg/dL — ABNORMAL HIGH (ref 70–99)
Glucose-Capillary: 207 mg/dL — ABNORMAL HIGH (ref 70–99)
Glucose-Capillary: 210 mg/dL — ABNORMAL HIGH (ref 70–99)
Glucose-Capillary: 215 mg/dL — ABNORMAL HIGH (ref 70–99)
Glucose-Capillary: 221 mg/dL — ABNORMAL HIGH (ref 70–99)
Glucose-Capillary: 264 mg/dL — ABNORMAL HIGH (ref 70–99)

## 2022-05-24 LAB — BASIC METABOLIC PANEL
Anion gap: 7 (ref 5–15)
Anion gap: 10 (ref 5–15)
BUN: 14 mg/dL (ref 6–20)
BUN: 12 mg/dL (ref 6–20)
CO2: 22 mmol/L (ref 22–32)
CO2: 22 mmol/L (ref 22–32)
Calcium: 8.5 mg/dL — ABNORMAL LOW (ref 8.9–10.3)
Calcium: 8.4 mg/dL — ABNORMAL LOW (ref 8.9–10.3)
Chloride: 103 mmol/L (ref 98–111)
Chloride: 103 mmol/L (ref 98–111)
Creatinine, Ser: 0.99 mg/dL (ref 0.61–1.24)
Creatinine, Ser: 0.85 mg/dL (ref 0.61–1.24)
GFR, Estimated: >60 mL/min (ref >60)
GFR, Estimated: >60 mL/min (ref >60)
Glucose, Bld: 244 mg/dL — ABNORMAL HIGH (ref 70–99)
Glucose, Bld: 146 mg/dL — ABNORMAL HIGH (ref 70–99)
Potassium: 3.1 mmol/L — ABNORMAL LOW (ref 3.5–5.1)
Potassium: 3.7 mmol/L (ref 3.5–5.1)
Sodium: 132 mmol/L — ABNORMAL LOW (ref 135–145)
Sodium: 135 mmol/L (ref 135–145)

## 2022-05-24 LAB — BETA-HYDROXYBUTYRIC ACID: Beta-Hydroxybutyric Acid: 0.07 mmol/L (ref 0.05–0.27)

## 2022-05-24 LAB — CBC WITH DIFFERENTIAL/PLATELET
Abs Immature Granulocytes: 0.1 10*3/uL — ABNORMAL HIGH (ref 0.00–0.07)
Basophils Absolute: 0.1 10*3/uL (ref 0.0–0.1)
Basophils Relative: 1 %
Eosinophils Absolute: 0.1 10*3/uL (ref 0.0–0.5)
Eosinophils Relative: 2 %
HCT: 32.4 % — ABNORMAL LOW (ref 39.0–52.0)
Hemoglobin: 11.6 g/dL — ABNORMAL LOW (ref 13.0–17.0)
Immature Granulocytes: 1 %
Lymphocytes Relative: 44 %
Lymphs Abs: 3.5 10*3/uL (ref 0.7–4.0)
MCH: 28.7 pg (ref 26.0–34.0)
MCHC: 35.8 g/dL (ref 30.0–36.0)
MCV: 80.2 fL (ref 80.0–100.0)
Monocytes Absolute: 0.6 10*3/uL (ref 0.1–1.0)
Monocytes Relative: 7 %
Neutro Abs: 3.7 10*3/uL (ref 1.7–7.7)
Neutrophils Relative %: 45 %
Platelets: 223 10*3/uL (ref 150–400)
RBC: 4.04 MIL/uL — ABNORMAL LOW (ref 4.22–5.81)
RDW: 12.7 % (ref 11.5–15.5)
WBC: 8.1 10*3/uL (ref 4.0–10.5)
nRBC: 0 % (ref 0.0–0.2)

## 2022-05-24 LAB — TSH: TSH: 1.639 u[IU]/mL (ref 0.350–4.500)

## 2022-05-24 LAB — CK: Total CK: 53 U/L (ref 49–397)

## 2022-05-24 LAB — PHOSPHORUS: Phosphorus: 4.5 mg/dL (ref 2.5–4.6)

## 2022-05-24 LAB — MAGNESIUM
Magnesium: 1.8 mg/dL (ref 1.7–2.4)
Magnesium: 2.1 mg/dL (ref 1.7–2.4)

## 2022-05-24 LAB — ETHANOL: Alcohol, Ethyl (B): <10 mg/dL (ref <10)

## 2022-05-24 LAB — OSMOLALITY: Osmolality: 301 mOsm/kg — ABNORMAL HIGH (ref 275–295)

## 2022-05-24 LAB — MRSA NEXT GEN BY PCR, NASAL: MRSA by PCR Next Gen: DETECTED — AB

## 2022-05-24 MED ORDER — MUPIROCIN 2 % EX OINT
1.0000 | TOPICAL_OINTMENT | Freq: Two times a day (BID) | CUTANEOUS | Status: DC
  Administered 2022-05-24 – 2022-05-27 (×6): 1 via NASAL
  Filled 2022-05-24 (×3): qty 22

## 2022-05-24 MED ORDER — LOSARTAN POTASSIUM 25 MG PO TABS
25.0000 mg | ORAL_TABLET | Freq: Every day | ORAL | Status: DC
  Administered 2022-05-24 – 2022-05-27 (×4): 25 mg via ORAL
  Filled 2022-05-24 (×4): qty 1

## 2022-05-24 MED ORDER — PREGABALIN 75 MG PO CAPS
200.0000 mg | ORAL_CAPSULE | Freq: Every day | ORAL | Status: DC
  Administered 2022-05-25 – 2022-05-27 (×3): 200 mg via ORAL
  Filled 2022-05-24: qty 1
  Filled 2022-05-24 (×2): qty 2

## 2022-05-24 MED ORDER — HYDROXYCHLOROQUINE SULFATE 200 MG PO TABS
200.0000 mg | ORAL_TABLET | Freq: Every day | ORAL | Status: DC
  Administered 2022-05-24 – 2022-05-27 (×4): 200 mg via ORAL
  Filled 2022-05-24 (×4): qty 1

## 2022-05-24 MED ORDER — INSULIN ASPART 100 UNIT/ML IJ SOLN
0.0000 [IU] | Freq: Every day | INTRAMUSCULAR | Status: DC
  Administered 2022-05-25: 5 [IU] via SUBCUTANEOUS
  Administered 2022-05-26: 4 [IU] via SUBCUTANEOUS

## 2022-05-24 MED ORDER — LURASIDONE HCL 20 MG PO TABS
20.0000 mg | ORAL_TABLET | Freq: Every day | ORAL | Status: DC
  Administered 2022-05-25 – 2022-05-26 (×3): 20 mg via ORAL
  Filled 2022-05-24 (×3): qty 1

## 2022-05-24 MED ORDER — INSULIN ASPART 100 UNIT/ML IJ SOLN
0.0000 [IU] | Freq: Three times a day (TID) | INTRAMUSCULAR | Status: DC
  Administered 2022-05-24: 5 [IU] via SUBCUTANEOUS
  Administered 2022-05-24: 8 [IU] via SUBCUTANEOUS
  Administered 2022-05-25: 11 [IU] via SUBCUTANEOUS
  Administered 2022-05-25: 8 [IU] via SUBCUTANEOUS
  Administered 2022-05-25: 5 [IU] via SUBCUTANEOUS
  Administered 2022-05-26: 11 [IU] via SUBCUTANEOUS
  Administered 2022-05-26: 8 [IU] via SUBCUTANEOUS
  Administered 2022-05-26: 11 [IU] via SUBCUTANEOUS
  Administered 2022-05-27: 8 [IU] via SUBCUTANEOUS

## 2022-05-24 MED ORDER — INSULIN GLARGINE-YFGN 100 UNIT/ML ~~LOC~~ SOLN
40.0000 [IU] | Freq: Every day | SUBCUTANEOUS | Status: DC
  Administered 2022-05-24: 40 [IU] via SUBCUTANEOUS
  Filled 2022-05-24 (×2): qty 0.4

## 2022-05-24 MED ORDER — PREGABALIN 75 MG PO CAPS
400.0000 mg | ORAL_CAPSULE | Freq: Every day | ORAL | Status: DC
  Administered 2022-05-25 – 2022-05-26 (×3): 400 mg via ORAL
  Filled 2022-05-24: qty 1
  Filled 2022-05-24 (×2): qty 4

## 2022-05-24 MED ORDER — POTASSIUM CHLORIDE CRYS ER 20 MEQ PO TBCR
40.0000 meq | EXTENDED_RELEASE_TABLET | Freq: Once | ORAL | Status: AC
  Administered 2022-05-24: 40 meq via ORAL
  Filled 2022-05-24: qty 2

## 2022-05-24 MED ORDER — POTASSIUM CHLORIDE IN NACL 20-0.9 MEQ/L-% IV SOLN
INTRAVENOUS | Status: DC
  Filled 2022-05-24 (×3): qty 1000

## 2022-05-24 NOTE — Inpatient Diabetes Management (Addendum)
Inpatient Diabetes Program Recommendations  AACE/ADA: New Consensus Statement on Inpatient Glycemic Control (2015)  Target Ranges:  Prepandial:   less than 140 mg/dL      Peak postprandial:   less than 180 mg/dL (1-2 hours)      Critically ill patients:  140 - 180 mg/dL    Latest Reference Range & Units 05/23/22 17:11  Sodium 135 - 145 mmol/L 121 (L)  Potassium 3.5 - 5.1 mmol/L 4.5  Chloride 98 - 111 mmol/L 89 (L)  CO2 22 - 32 mmol/L 21 (L)  Glucose 70 - 99 mg/dL 1,184 (HH)  BUN 6 - 20 mg/dL 17  Creatinine 0.61 - 1.24 mg/dL 1.37 (H)  Calcium 8.9 - 10.3 mg/dL 8.2 (L)  Anion gap 5 - 15  11  (HH): Data is critically high (L): Data is abnormally low (H): Data is abnormally high  Latest Reference Range & Units 05/23/22 16:16 05/23/22 19:59 05/23/22 20:38 05/23/22 21:36 05/23/22 22:15 05/23/22 23:03 05/23/22 23:43 05/24/22 00:57 05/24/22 01:57  Glucose-Capillary 70 - 99 mg/dL >600 (HH) >600 (HH)  IV Insulin Drip Started >600 (HH) 481 (H) 404 (H) 297 (H) 237 (H) 205 (H) 207 (H)  (HH): Data is critically high (H): Data is abnormally high  Latest Reference Range & Units 05/24/22 02:53 05/24/22 03:52 05/24/22 04:56 05/24/22 05:55 05/24/22 06:51  Glucose-Capillary 70 - 99 mg/dL 210 (H) 204 (H) 194 (H) 181 (H) 221 (H)  IV Insulin Drip Infusing  (H): Data is abnormally high   Admit with:  Hyperglycemia  He acknowledges a history of underlying type 2 diabetes mellitus, and reports very infrequent use of his basal insulin as well as a short acting insulin as an outpatientn over the last month, he also notes progressive polyuria, polydipsia.   History: DM  Home DM Meds: Humalog U200 15 units BID (pt states 10 units TID)        Toujeo (pt states 60 units daily)        Current Orders: IV Insulin Drip   PCP: Dr. Santiago Glad Richter--Last seen 04/15/2021    MD- Note 3am BMET shows Anion Gap and CO2 WNL.  CBGs remain >200 since 3am.  May consider leaving pt on the IV Insulin Drip until his  CBGs are <180.  When transitioning to SQ Insulin, please consider using weight-based approach for the basal insulin (pt admits to taking his home insulin infrequently)  1. Semglee 18 units Daily (0.3 units/kg)--Make sure to continue the IV Insulin drip for 2 hours after the Semglee on board then can d/c the IV Insulin Drip  2. Start Novolog Sensitive Correction Scale/ SSI (0-9 units) TID AC + HS  3. If allowed to eat, start Novolog 3 units TID for meal coverage    Addendum 11am--Met w/ pt at bedside this AM  Pt easily awoke to my voice and was able to answer all my questions.  Pt told me he is supposed to take Toujeo 60 units Daily + Humalog 10 units TID with meals.  Told me he is "bad" about taking his insulin--Often forgets and sometimes just doesn't feel like taking it.  Told me his CBG meter was broken for a while.  The day prior to admission, pt told me he received a Freestyle [place]re 2 CGM + the reader from [employer].  Stated to me he thinks Medicare may have sent it b/c he got a call from Medicare several days prior and the Medicare rep was talking about it.  Pt told me he  knows how to apply and use the [place] 2 b/c he has used them before and also b/c his friend has one.  I briefly reviewed how to set up the reader and how to apply the sensor and also reminded pt that the sensor needs to be changed Q14 days and the importance of arm rotation.  Discussed w/ pt that he will need to seek refills on the [place] 2 from his PCP.  Pt told me he sees Dr. Darron Doom and just saw her yesterday--According to pt, Dr. Darron Doom sent him to the ED from her office.  Discussed with his admission Lab Glu, other lab results, and transition plan to SQ insulin regimen today.  Discussed with pt that we will give him Semglee and Novolog insulins in the hospital and that he can restart his home meds when he goes home.  Discussed with pt that we have a current A1c pending--Pt told me his last A1c was around 14%.  Reviewed with pt the  importance of taking his insulin as prescribed to prevent long-term devastating complications and also the short-term complications like dehydration and possible DKA.  Reviewed goal CBGs and goal A1c for home.  Strongly encouraged pt to take his insulin at home as prescribed.      --Will follow patient during hospitalization--  Wyn Quaker RN, MSN, [place] Diabetes Coordinator Inpatient Glycemic Control Team Team Pager: [phone] (8a-5p)

## 2022-05-24 NOTE — Progress Notes (Signed)
  Transition of Care [facility]) Screening Note   Patient Details  Name: Vincent [name] [facility] Date of Birth: 1965-08-19   Transition of Care [facility]) CM/SW Contact:    [name], RN Phone Number: 05/24/2022, 1:40 PM   Transition of Care Department [facility]) has reviewed patient and no TOC needs have been identified at this time. We will continue to monitor patient advancement through interdisciplinary progression rounds. If new patient transition needs arise, please place a TOC consult.

## 2022-05-24 NOTE — Evaluation (Addendum)
Physical Therapy Evaluation Patient Details Name: Vincent [name] MRN: [id number] DOB: Nov 09, 1965 Today's Date: 05/24/2022  History of Present Illness  This is a 57 year old male with diabetes mellitus type 2 who is noncompliant with medication, essential hypertension and hyperlipidemia who presented 05/23/22  to the hospital with generalized weakness, diarrhea and feeling lightheaded.  In the ED he was noted to have hyperglycemia and CBG showed a glucose of 1184.  Sodium was 121  Clinical Impression  Pt admitted with above diagnosis.  Pt currently with functional limitations due to the deficits listed below (see PT Problem List). Pt will benefit from skilled PT to increase their independence and safety with mobility to allow discharge to the venue listed below.   The patient present with significant and fast decline in motor and sensory function, especially both hands.  Patient reports that he drove himself  to MD office and hospital yesterday(day of adm).  Today patient has limited use of both hands, standing and taking steps to get to bed very unsteady and Unable to ambulate.  Continue PT and DC planning.         Recommendations for follow up therapy are one component of a multi-disciplinary discharge planning process, led by the attending physician.  Recommendations may be updated based on patient status, additional functional criteria and insurance authorization.  Follow Up Recommendations AIR- 3 hours of therapy per day. Can patient physically be transported by private vehicle: Yes    Assistance Recommended at Discharge Frequent or constant Supervision/Assistance  Patient can return home with the following   Frequent assistance    Equipment Recommendations None recommended by PT  Recommendations for Other Services    OT   Functional Status Assessment Patient has had a recent decline in their functional status and demonstrates the ability to make significant improvements in  function in a reasonable and predictable amount of time.     Precautions / Restrictions Precautions Precautions: Fall Precaution Comments: both hands numb      Mobility  Bed Mobility Overal bed mobility: Needs Assistance Bed Mobility: Sit to Supine       Sit to supine: Min guard        Transfers Overall transfer level: Needs assistance Equipment used: Rolling walker (2 wheels) Transfers: Sit to/from Stand, Bed to chair/wheelchair/BSC Sit to Stand: Mod assist, Max assist   Step pivot transfers: Max assist       General transfer comment: max assitance to power up to stand from recliner, decreased control of descent. Mod/max support to step/pivot to bed. Stood at Rw and stepped in place x 10, side stepped x 2 small steps, knees hyperextended in standing    Ambulation/Gait                  Stairs            Wheelchair Mobility    Modified Rankin (Stroke Patients Only)       Balance Overall balance assessment: Needs assistance Sitting-balance support: Feet supported, No upper extremity supported Sitting balance-Leahy Scale: Fair     Standing balance support: Bilateral upper extremity supported, During functional activity, Reliant on assistive device for balance Standing balance-Leahy Scale: Poor Standing balance comment: reliant on RW                             Pertinent Vitals/Pain Pain Assessment Pain Assessment: No/denies pain    Home Living Family/patient expects to be discharged to:: Private  residence Living Arrangements: Spouse/significant other;Children Available Help at Discharge: Family;Available 24 hours/day Type of Home: Mobile home Home Access: Stairs to enter Entrance Stairs-Rails: Right;Left Entrance Stairs-Number of Steps: 5   Home Layout: One level Home Equipment: Rollator (4 wheels) Additional Comments: pt. reports his sign. other just out of hospital and is on lifting restrictions    Prior Function Prior  Level of Function : Needs assist;Driving;History of Falls (last six months) (multiple falls)       Physical Assist : Mobility (physical)     Mobility Comments: ambulates w/ RW; ADLs Comments: LB ADL complications d/t difficulties in bending and reaching     Hand Dominance   Dominant Hand: Left    Extremity/Trunk Assessment   Upper Extremity Assessment Upper Extremity Assessment: RUE deficits/detail;LUE deficits/detail RUE Deficits / Details: LT impaired from wrist to fingers, poor grip, poor fine motor RUE Sensation: decreased light touch;decreased proprioception LUE Deficits / Details: worse than right , small finger amp. LUE Sensation: decreased light touch;decreased proprioception    Lower Extremity Assessment Lower Extremity Assessment: Generalized weakness;LLE deficits/detail;RLE deficits/detail RLE Sensation: decreased proprioception;history of peripheral neuropathy LLE Sensation: decreased proprioception;history of peripheral neuropathy    Cervical / Trunk Assessment Cervical / Trunk Assessment: Normal  Communication   Communication: No difficulties  Cognition Arousal/Alertness: Awake/alert Behavior During Therapy: WFL for tasks assessed/performed Overall Cognitive Status: Within Functional Limits for tasks assessed                                          General Comments      Exercises     Assessment/Plan    PT Assessment Patient needs continued PT services  PT Problem List Decreased strength;Decreased activity tolerance;Decreased mobility;Decreased balance;Decreased coordination;Impaired sensation       PT Treatment Interventions DME instruction;Therapeutic activities;Balance training;Gait training;Functional mobility training;Therapeutic exercise;Patient/family education    PT Goals (Current goals can be found in the Care Plan section)  Acute Rehab PT Goals Patient Stated Goal: to get rehab to walk again PT Goal Formulation: With  patient Time For Goal Achievement: 06/07/22 Potential to Achieve Goals: Fair    Frequency Min 3X/week     Co-evaluation               AM-PAC PT "6 Clicks" Mobility  Outcome Measure Help needed turning from your back to your side while in a flat bed without using bedrails?: A Little Help needed moving from lying on your back to sitting on the side of a flat bed without using bedrails?: A Little Help needed moving to and from a bed to a chair (including a wheelchair)?: A Lot Help needed standing up from a chair using your arms (e.g., wheelchair or bedside chair)?: A Lot Help needed to walk in hospital room?: Total Help needed climbing 3-5 steps with a railing? : Total 6 Click Score: 12    End of Session Equipment Utilized During Treatment: Gait belt Activity Tolerance: Patient limited by fatigue;Treatment limited secondary to medical complications (Comment) Patient left: in bed;with call bell/phone within reach;with bed alarm set Nurse Communication: Mobility status (need for external urinary catheter) PT Visit Diagnosis: Unsteadiness on feet (R26.81);Repeated falls (R29.6);History of falling (Z91.81);Difficulty in walking, not elsewhere classified (R26.2);Other symptoms and signs involving the nervous system (R29.898)    Time: [id number] PT Time Calculation (min) (ACUTE ONLY): 28 min   Charges:   PT Evaluation $PT Eval Low  Complexity: 1 Low PT Treatments $Therapeutic Activity: 8-22 mins       [place] Office [phone] Weekend [id number]   Claretha Cooper 05/24/2022, 4:51 PM

## 2022-05-24 NOTE — Progress Notes (Signed)
[facility] Progress Note  Patient: Vincent [name]     [id number]  DOA: 05/23/2022   PCP: Hayden Rasmussen, MD       Brief hospital course: This is a 57 year old male with diabetes mellitus type 2 who is noncompliant with medication, essential hypertension and hyperlipidemia who presented to the hospital with generalized weakness, diarrhea and feeling lightheaded.  In the ED he was noted to have hyperglycemia and CBG showed a glucose of 1184.  Sodium was 121 and creatinine was 1.37.  Baseline is around 0.9.  He was started on insulin infusion and IV fluids.  Subjective:  He states his hands are numb bilaterally and equally.  This is nothing new but it is worse than usual since yesterday and he is having trouble picking up items he admitted to missing "a couple of doses of antibiotics.  He does not have any diarrhea nausea or vomiting. Assessment and Plan: Principal Problem:   Hyperosmolar hyperglycemic state (HHS) with noncompliance with meds - Sugars have improved and I will stop his insulin infusion and start him on Lantus and NovoLog sliding scale - Last A1c on 07/28/2021 was 14.1-repeat A1c is in process - Have educated him on maintaining compliance  Active Problems:   AKI (acute kidney injury) ([place]) -Creatinine 1.37 has improved to 0.99 which is his baseline - Continue IV fluids today  Acute on chronic peripheral neuropathy - With numbness in his hands - Continue Lyrica and follow  Hypokalemia - Potassium is 3.1 - Replacing - Follow-up tomorrow    History of rheumatoid arthritis -Continue Plaquenil  Bipolar disorder - Continue Latuda, duloxetine, venlafaxine and Lamictal    Tobacco abuse He smokes 1-1/2 packs/day - A nicotine patch has been ordered   Hypertension - Resume losartan  The patient is on Plavix and this has been resumed       Code Status: Full Code Consultants: none Level of Care: Level of care: Stepdown Total time on patient care: 35  min DVT prophylaxis:  SCDs Start: 05/23/22 2123     Objective:   Vitals:   05/24/22 1030 05/24/22 1100 05/24/22 1130 05/24/22 1200  BP:  (!) 181/78  (!) 172/77  Pulse: 78 79 85 79  Resp: 18     Temp:    97.7 F (36.5 C)  TempSrc:    Oral  SpO2: 97% 96% 99% 98%  Weight:      Height:       Filed Weights   05/23/22 1614 05/23/22 2337 05/24/22 0500  Weight: 76.2 kg 63.4 kg 58.4 kg   Exam: General exam: Appears comfortable  HEENT: oral mucosa moist Respiratory system: Clear to auscultation.  Cardiovascular system: S1 & S2 heard  Gastrointestinal system: Abdomen soft, non-tender, nondistended. Normal bowel sounds   Extremities: No cyanosis, clubbing or edema Psychiatry:  Mood & affect appropriate.   Neuro: numbness to touch in fingers but able to feel pressure    CBC: Recent Labs  Lab 05/23/22 1711 05/24/22 0254  WBC 6.0 8.1  NEUTROABS  --  3.7  HGB 11.9* 11.6*  HCT 35.6* 32.4*  MCV 86.0 80.2  PLT 238 [id number]   Basic Metabolic Panel: Recent Labs  Lab 05/23/22 1711 05/23/22 2348 05/24/22 0254 05/24/22 0851  NA 121* 132* 134* 135  K 4.5 3.1* 3.1* 3.7  CL 89* 103 101 103  CO2 21* '22 24 22  '$ GLUCOSE 1,184* 244* 220* 146*  BUN '17 14 14 12  '$ CREATININE 1.37* 0.99 0.92 0.85  CALCIUM  8.2* 8.5* 8.8* 8.4*  MG  --  1.8 2.1  --   PHOS  --   --  4.5  --    GFR: Estimated Creatinine Clearance: 82.1 mL/min (by C-G formula based on SCr of 0.85 mg/dL).  Scheduled Meds:  Chlorhexidine Gluconate Cloth  6 each Topical Daily   clopidogrel  75 mg Oral Daily   DULoxetine  60 mg Oral Daily   fenofibrate  160 mg Oral Daily   hydroxychloroquine  200 mg Oral Daily   insulin aspart  0-15 Units Subcutaneous TID WC   insulin aspart  0-5 Units Subcutaneous QHS   insulin glargine-yfgn  40 Units Subcutaneous Daily   lamoTRIgine  150 mg Oral BID   lurasidone  80 mg Oral Q breakfast   mupirocin ointment  1 Application Nasal BID   pantoprazole  40 mg Oral Daily   rosuvastatin  10 mg  Oral Daily   tamsulosin  0.8 mg Oral QHS   Continuous Infusions:  insulin Stopped (05/24/22 1050)   sodium chloride 0.45 % 1,000 mL with potassium chloride 10 mEq infusion Stopped (05/23/22 2351)   Imaging and lab data was personally reviewed DG Chest Port 1 View  Result Date: 05/23/2022 CLINICAL DATA:  Hyperglycemia EXAM: PORTABLE CHEST 1 VIEW COMPARISON:  04/05/2022 FINDINGS: Stable cardiomediastinal silhouette. Aortic atherosclerotic calcification. No focal consolidation, pleural effusion, or pneumothorax. No acute osseous abnormality. Low lung volumes IMPRESSION: No active disease. Electronically Signed   By: Placido Sou M.D.   On: 05/23/2022 23:54   CT Head Wo Contrast  Result Date: 05/23/2022 CLINICAL DATA:  Weakness, acute stroke suspected. EXAM: CT HEAD WITHOUT CONTRAST TECHNIQUE: Contiguous axial images were obtained from the base of the skull through the vertex without intravenous contrast. RADIATION DOSE REDUCTION: This exam was performed according to the departmental dose-optimization program which includes automated exposure control, adjustment of the mA and/or kV according to patient size and/or use of iterative reconstruction technique. COMPARISON:  None Available. FINDINGS: Brain: No acute intracranial hemorrhage. No focal mass lesion. No CT evidence of acute infarction. No midline shift or mass effect. No hydrocephalus. Basilar cisterns are patent. Vascular: No hyperdense vessel or unexpected calcification. Skull: Normal. Negative for fracture or focal lesion. Sinuses/Orbits: Paranasal sinuses and mastoid air cells are clear. Orbits are clear. Other: None. IMPRESSION: No acute intracranial findings.  No CT evidence of acute infarction. Electronically Signed   By: Suzy Bouchard M.D.   On: 05/23/2022 19:46    LOS: 0 days   Author: Eunice Blase Zella Dewan  05/24/2022 1:40 PM  To contact [facility]>   Check the care team in [facility] and look for the attending/consulting [facility] provider  listed  Log into [email or web address] and use [place]'s universal password   Go to> "[facility]"  and find provider  If you still have difficulty reaching the provider, please page the [facility] (Director on Call) for the Hospitalists listed on amion

## 2022-05-25 ENCOUNTER — Inpatient Hospital Stay (HOSPITAL_COMMUNITY): Payer: Medicare HMO

## 2022-05-25 DIAGNOSIS — E1100 Type 2 diabetes mellitus with hyperosmolarity without nonketotic hyperglycemic-hyperosmolar coma (NKHHC): Secondary | ICD-10-CM | POA: Diagnosis not present

## 2022-05-25 LAB — GLUCOSE, CAPILLARY
Glucose-Capillary: 363 mg/dL — ABNORMAL HIGH (ref 70–99)
Glucose-Capillary: 347 mg/dL — ABNORMAL HIGH (ref 70–99)
Glucose-Capillary: 279 mg/dL — ABNORMAL HIGH (ref 70–99)
Glucose-Capillary: 352 mg/dL — ABNORMAL HIGH (ref 70–99)

## 2022-05-25 LAB — BASIC METABOLIC PANEL
Anion gap: 8 (ref 5–15)
BUN: 14 mg/dL (ref 6–20)
CO2: 22 mmol/L (ref 22–32)
Calcium: 8.3 mg/dL — ABNORMAL LOW (ref 8.9–10.3)
Chloride: 103 mmol/L (ref 98–111)
Creatinine, Ser: 1 mg/dL (ref 0.61–1.24)
GFR, Estimated: >60 mL/min (ref >60)
Glucose, Bld: 389 mg/dL — ABNORMAL HIGH (ref 70–99)
Potassium: 4 mmol/L (ref 3.5–5.1)
Sodium: 133 mmol/L — ABNORMAL LOW (ref 135–145)

## 2022-05-25 LAB — HEMOGLOBIN A1C
Hgb A1c MFr Bld: >15.5 % — ABNORMAL HIGH (ref 4.8–5.6)
Mean Plasma Glucose: >398 mg/dL

## 2022-05-25 MED ORDER — INSULIN GLARGINE-YFGN 100 UNIT/ML ~~LOC~~ SOLN
60.0000 [IU] | Freq: Every day | SUBCUTANEOUS | Status: DC
  Administered 2022-05-25: 60 [IU] via SUBCUTANEOUS
  Filled 2022-05-25 (×2): qty 0.6

## 2022-05-25 NOTE — Progress Notes (Signed)
  Inpatient Rehab Admissions Coordinator :  Per therapy recommendations patient was screened for CIR candidacy by Danne Baxter RN MSN. Noted admitted with glucose of 1184. Would like to see how he progresses with glucose is treated before pursuing rehab venue options. I will follow for progress and place a Rehab Consult order if felt to be appropriate. Please contact me with any questions.  Danne Baxter RN MSN Admissions Coordinator [phone]

## 2022-05-25 NOTE — Inpatient Diabetes Management (Signed)
Inpatient Diabetes Program Recommendations  AACE/ADA: New Consensus Statement on Inpatient Glycemic Control (2015)  Target Ranges:  Prepandial:   less than 140 mg/dL      Peak postprandial:   less than 180 mg/dL (1-2 hours)      Critically ill patients:  140 - 180 mg/dL   Lab Results  Component Value Date   GLUCAP 264 (H) 05/24/2022   HGBA1C 14.1 (H) 07/28/2021    Review of Glycemic Control   Current orders for Inpatient glycemic control: Semglee 60 QD, Novolog 0-15 TID with meals and 0-5 HS  Will likely need meal coverage insulin.  Inpatient Diabetes Program Recommendations:    Consider adding Novolog 6 units TID with meals if eating > 50%.  Will follow closely.  Thank you. Lorenda Peck, RD, LDN, [place] Inpatient Diabetes Coordinator [phone]

## 2022-05-25 NOTE — Evaluation (Signed)
Occupational Therapy Evaluation Patient Details Name: Vincent [name] MRN: [id number] DOB: 03/24/1965 Today's Date: 05/25/2022   History of Present Illness This is a 57 year old male with diabetes mellitus type 2 who is noncompliant with medication, essential hypertension and hyperlipidemia who presented 05/23/22  to the hospital with generalized weakness, diarrhea and feeling lightheaded.  In the ED he was noted to have hyperglycemia and CBG showed a glucose of 1184.  Sodium was 121   Clinical Impression   Mr. Manases Chesky is a 57 year old man who presents with generalized weakness (left side weaker than right), decreased activity tolerance, impaired balance, and impaired sensation in hands and lower legs and decreased fine motor coordination. He is typically able to ambulate with a walker in home and perform BADLs. He reports his girlfriend just moved in with him.  Today he is limited by soft Bps and activity only able to stand and march in place. He needs increased assistance for ADLs. He has significant weakness of left upper and lower extremity. Patient will benefit from skilled OT services while in hospital to improve deficits and learn compensatory strategies as needed in order to return to PLOF.       Recommendations for follow up therapy are one component of a multi-disciplinary discharge planning process, led by the attending physician.  Recommendations may be updated based on patient status, additional functional criteria and insurance authorization.   Follow Up Recommendations  Acute inpatient rehab (3hours/day)     Assistance Recommended at Discharge Frequent or constant Supervision/Assistance  Patient can return home with the following A little help with walking and/or transfers;A lot of help with bathing/dressing/bathroom;Assistance with cooking/housework;Help with stairs or ramp for entrance    Functional Status Assessment  Patient has had a recent decline in their functional  status and demonstrates the ability to make significant improvements in function in a reasonable and predictable amount of time.  Equipment Recommendations  None recommended by OT    Recommendations for Other Services       Precautions / Restrictions Precautions Precautions: Fall Precaution Comments: hands and lower legs numb Restrictions Weight Bearing Restrictions: No      Mobility Bed Mobility               General bed mobility comments: up in chair    Transfers Overall transfer level: Needs assistance Equipment used: Rolling walker (2 wheels) Transfers: Sit to/from Stand Sit to Stand: Min assist           General transfer comment: Min assis tto stand from recliner today and march 15 seconds      Balance Overall balance assessment: Needs assistance Sitting-balance support: No upper extremity supported, Feet supported Sitting balance-Leahy Scale: Fair     Standing balance support: During functional activity, Reliant on assistive device for balance Standing balance-Leahy Scale: Poor Standing balance comment: reliant on RW                           ADL either performed or assessed with clinical judgement   ADL Overall ADL's : Needs assistance/impaired Eating/Feeding: Set up;Sitting Eating/Feeding Details (indicate cue type and reason): reports difficulty with utensils at base line because he can't feel them Grooming: Set up;Adhering to UE precautions   Upper Body Bathing: Set up;Sitting   Lower Body Bathing: Maximal assistance;Sitting/lateral leans   Upper Body Dressing : Set up;Sitting   Lower Body Dressing: Maximal assistance;Sit to/from stand   Toilet Transfer: Minimal assistance;BSC/3in1;Rolling  walker (2 wheels);Stand-pivot   Toileting- Water quality scientist and Hygiene: Moderate assistance;Sitting/lateral lean       Functional mobility during ADLs: Minimal assistance;Rolling walker (2 wheels) General ADL Comments: Limited to  just sit to stands and marching in place x 15 seconds due to soft BPs and patient fatiguing quickly     Vision Patient Visual Report: No change from baseline       Perception     Praxis      Pertinent Vitals/Pain Pain Assessment Pain Assessment: No/denies pain     Hand Dominance Left   Extremity/Trunk Assessment Upper Extremity Assessment Upper Extremity Assessment: RUE deficits/detail;LUE deficits/detail RUE Deficits / Details: WFL ROM, grossly 4-/5 shoulder strength, decreased fine motor coordination, grip 4-/5, RUE Sensation: decreased light touch;decreased proprioception;history of peripheral neuropathy RUE Coordination: decreased fine motor LUE Deficits / Details: WFL ROM, missing 5th digit, L sided strength 3+/5 strength throughout, even worse coordination compared to right hand. LUE Sensation: decreased light touch;decreased proprioception LUE Coordination: decreased fine motor   Lower Extremity Assessment Lower Extremity Assessment: Defer to PT evaluation   Cervical / Trunk Assessment Cervical / Trunk Assessment: Normal   Communication Communication Communication: No difficulties   Cognition Arousal/Alertness: Awake/alert Behavior During Therapy: WFL for tasks assessed/performed Overall Cognitive Status: Within Functional Limits for tasks assessed                                       General Comments       Exercises     Shoulder Instructions      Home Living Family/patient expects to be discharged to:: Private residence Living Arrangements: Spouse/significant other;Children Available Help at Discharge: Family;Available 24 hours/day Type of Home: Mobile home Home Access: Stairs to enter Entrance Stairs-Number of Steps: 5 Entrance Stairs-Rails: Right;Left Home Layout: One level     Bathroom Shower/Tub: Tub/shower unit         Home Equipment: Rollator (4 wheels)          Prior Functioning/Environment Prior Level of Function  : Needs assist;Driving;History of Falls (last six months) (multiple falls)       Physical Assist : Mobility (physical)     Mobility Comments: ambulates w/ RW; ADLs Comments: LB ADL complications d/t difficulties in bending and reaching        OT Problem List: Decreased strength;Decreased activity tolerance;Impaired balance (sitting and/or standing);Decreased coordination;Impaired UE functional use;Impaired sensation;Decreased knowledge of use of DME or AE;Cardiopulmonary status limiting activity      OT Treatment/Interventions: Self-care/ADL training;Therapeutic exercise;DME and/or AE instruction;Therapeutic activities;Balance training;Patient/family education    OT Goals(Current goals can be found in the care plan section) Acute Rehab OT Goals Patient Stated Goal: to get stronger OT Goal Formulation: With patient Time For Goal Achievement: 06/08/22 Potential to Achieve Goals: Good  OT Frequency: Min 2X/week    Co-evaluation              AM-PAC OT "6 Clicks" Daily Activity     Outcome Measure Help from another person eating meals?: A Little Help from another person taking care of personal grooming?: A Little Help from another person toileting, which includes using toliet, bedpan, or urinal?: A Lot Help from another person bathing (including washing, rinsing, drying)?: A Lot Help from another person to put on and taking off regular upper body clothing?: A Little Help from another person to put on and taking off regular lower body clothing?: A  Lot 6 Click Score: 15   End of Session Equipment Utilized During Treatment: Rolling walker (2 wheels) Nurse Communication: Mobility status  Activity Tolerance: Patient tolerated treatment well Patient left: in chair;with call bell/phone within reach;with chair alarm set  OT Visit Diagnosis: Muscle weakness (generalized) (M62.81);Repeated falls (R29.6)                Time: [id number] OT Time Calculation (min): 11 min Charges:  OT  General Charges $OT Visit: 1 Visit OT Evaluation $OT Eval Low Complexity: 1 Low  Gustavo Lah, OTR/L [place]  Office [phone]   Lenward Chancellor 05/25/2022, 2:22 PM

## 2022-05-25 NOTE — Progress Notes (Signed)
[facility] Progress Note  Patient: Vincent [name]     [id number]  DOA: 05/23/2022   PCP: Hayden Rasmussen, MD       Brief hospital course: This is a 57 year old male with diabetes mellitus type 2 who is noncompliant with medication, essential hypertension and hyperlipidemia who presented to the hospital with generalized weakness, diarrhea and feeling lightheaded.  In the ED he was noted to have hyperglycemia and CBG showed a glucose of 1184.  Sodium was 121 and creatinine was 1.37.  Baseline is around 0.9.  He was started on insulin infusion and IV fluids.  Subjective:  His hands still feel numb. He admits to having difficulty ambulating for 6-12 mo now. Overall, he feels better today. Agreeable to go to CIR  Assessment and Plan: Principal Problem:   Hyperosmolar hyperglycemic state (HHS) with noncompliance with meds - Increase Lantus to 60 U today as his fasting glucose is in 300s - Last A1c on 07/28/2021 was 14.1-repeat A1c is still in process - Have educated him on maintaining compliance  Active Problems:   AKI (acute kidney injury) ([place]) -Creatinine 1.37 has improved to 0.99 which is his baseline - Will stop IVF today  Acute on chronic peripheral neuropathy - With numbness in his hands and difficulty picking up things- he is able to feed himself- we discussed the connection of these symptoms with DM but since the symptoms in his feet are not as severe as his hands, I will obtain an MRI of his c spine - Continue Lyrica, which he takes at home, and follow  Hypokalemia - Potassium 3.1> 4.0 after replacement    History of rheumatoid arthritis -Continue Plaquenil  Bipolar disorder - Continue Latuda, duloxetine, venlafaxine and Lamictal    Tobacco abuse He smokes 1-1/2 packs/day - A nicotine patch has been ordered   Hypertension - Resumed losartan  The patient is on Plavix and this has been resumed       Code Status: Full Code Consultants: none Level of  Care: Level of care: Telemetry Total time on patient care: 35 min DVT prophylaxis:  SCDs Start: 05/23/22 2123     Objective:   Vitals:   05/25/22 0700 05/25/22 0800 05/25/22 0815 05/25/22 0900  BP: 130/79 (!) 145/71  117/68  Pulse: 84 84  86  Resp: '18 17  15  '$ Temp:   98.1 F (36.7 C)   TempSrc:   Axillary   SpO2: 96% 94%  95%  Weight:      Height:       Filed Weights   05/23/22 1614 05/23/22 2337 05/24/22 0500  Weight: 76.2 kg 63.4 kg 58.4 kg   Exam: General exam: Appears comfortable  HEENT: oral mucosa moist Respiratory system: Clear to auscultation.  Cardiovascular system: S1 & S2 heard  Gastrointestinal system: Abdomen soft, non-tender, nondistended. Normal bowel sounds   Extremities: No cyanosis, clubbing or edema Psychiatry:  Mood & affect appropriate.      CBC: Recent Labs  Lab 05/23/22 1711 05/24/22 0254  WBC 6.0 8.1  NEUTROABS  --  3.7  HGB 11.9* 11.6*  HCT 35.6* 32.4*  MCV 86.0 80.2  PLT 238 Q000111Q    Basic Metabolic Panel: Recent Labs  Lab 05/23/22 1711 05/23/22 2348 05/24/22 0254 05/24/22 0851 05/25/22 0305  NA 121* 132* 134* 135 133*  K 4.5 3.1* 3.1* 3.7 4.0  CL 89* 103 101 103 103  CO2 21* '22 24 22 22  '$ GLUCOSE 1,184* 244* 220* 146* 389*  BUN  $'17 14 14 12 14  'a$ CREATININE 1.37* 0.99 0.92 0.85 1.00  CALCIUM 8.2* 8.5* 8.8* 8.4* 8.3*  MG  --  1.8 2.1  --   --   PHOS  --   --  4.5  --   --     GFR: Estimated Creatinine Clearance: 69.8 mL/min (by C-G formula based on SCr of 1 mg/dL).  Scheduled Meds:  Chlorhexidine Gluconate Cloth  6 each Topical Daily   clopidogrel  75 mg Oral Daily   DULoxetine  60 mg Oral Daily   fenofibrate  160 mg Oral Daily   hydroxychloroquine  200 mg Oral Daily   insulin aspart  0-15 Units Subcutaneous TID WC   insulin aspart  0-5 Units Subcutaneous QHS   insulin glargine-yfgn  60 Units Subcutaneous Daily   lamoTRIgine  150 mg Oral BID   losartan  25 mg Oral Daily   lurasidone  20 mg Oral QHS   lurasidone   80 mg Oral Q breakfast   mupirocin ointment  1 Application Nasal BID   pantoprazole  40 mg Oral Daily   pregabalin  200 mg Oral Daily   pregabalin  400 mg Oral QHS   rosuvastatin  10 mg Oral Daily   tamsulosin  0.8 mg Oral QHS   Continuous Infusions:   Imaging and lab data was personally reviewed DG Chest Port 1 View  Result Date: 05/23/2022 CLINICAL DATA:  Hyperglycemia EXAM: PORTABLE CHEST 1 VIEW COMPARISON:  04/05/2022 FINDINGS: Stable cardiomediastinal silhouette. Aortic atherosclerotic calcification. No focal consolidation, pleural effusion, or pneumothorax. No acute osseous abnormality. Low lung volumes IMPRESSION: No active disease. Electronically Signed   By: Placido Sou M.D.   On: 05/23/2022 23:54   CT Head Wo Contrast  Result Date: 05/23/2022 CLINICAL DATA:  Weakness, acute stroke suspected. EXAM: CT HEAD WITHOUT CONTRAST TECHNIQUE: Contiguous axial images were obtained from the base of the skull through the vertex without intravenous contrast. RADIATION DOSE REDUCTION: This exam was performed according to the departmental dose-optimization program which includes automated exposure control, adjustment of the mA and/or kV according to patient size and/or use of iterative reconstruction technique. COMPARISON:  None Available. FINDINGS: Brain: No acute intracranial hemorrhage. No focal mass lesion. No CT evidence of acute infarction. No midline shift or mass effect. No hydrocephalus. Basilar cisterns are patent. Vascular: No hyperdense vessel or unexpected calcification. Skull: Normal. Negative for fracture or focal lesion. Sinuses/Orbits: Paranasal sinuses and mastoid air cells are clear. Orbits are clear. Other: None. IMPRESSION: No acute intracranial findings.  No CT evidence of acute infarction. Electronically Signed   By: Suzy Bouchard M.D.   On: 05/23/2022 19:46    LOS: 1 day   Author: Debbe Odea  05/25/2022 11:28 AM  To contact [facility]>   Check the care team  in [facility] and look for the attending/consulting [place] provider listed  Log into [email or web address] and use [place]'s universal password   Go to> "[facility]"  and find provider  If you still have difficulty reaching the provider, please page the [facility] (Director on Call) for the Hospitalists listed on amion

## 2022-05-26 ENCOUNTER — Inpatient Hospital Stay (HOSPITAL_COMMUNITY): Payer: Medicare HMO

## 2022-05-26 DIAGNOSIS — E1100 Type 2 diabetes mellitus with hyperosmolarity without nonketotic hyperglycemic-hyperosmolar coma (NKHHC): Secondary | ICD-10-CM | POA: Diagnosis not present

## 2022-05-26 LAB — GLUCOSE, CAPILLARY
Glucose-Capillary: 277 mg/dL — ABNORMAL HIGH (ref 70–99)
Glucose-Capillary: 346 mg/dL — ABNORMAL HIGH (ref 70–99)
Glucose-Capillary: 321 mg/dL — ABNORMAL HIGH (ref 70–99)
Glucose-Capillary: 345 mg/dL — ABNORMAL HIGH (ref 70–99)

## 2022-05-26 MED ORDER — INSULIN GLARGINE-YFGN 100 UNIT/ML ~~LOC~~ SOLN
80.0000 [IU] | Freq: Every day | SUBCUTANEOUS | Status: DC
  Administered 2022-05-26 – 2022-05-27 (×2): 80 [IU] via SUBCUTANEOUS
  Filled 2022-05-26 (×2): qty 0.8

## 2022-05-26 NOTE — Progress Notes (Addendum)
[facility] Progress Note  Patient: Vincent [name]     [id number]  DOA: 05/23/2022   PCP: Hayden Rasmussen, MD       Brief hospital course: This is a 57 year old male with diabetes mellitus type 2 who is noncompliant with medication, essential hypertension and hyperlipidemia who presented to the hospital with generalized weakness, diarrhea and feeling lightheaded.  In the ED he was noted to have hyperglycemia and CBG showed a glucose of 1184.  Sodium was 121 and creatinine was 1.37.  Baseline is around 0.9.  He was started on insulin infusion and IV fluids.  Subjective:  He feels like he is able to use his hands better today. No other complaints.   Assessment and Plan: Principal Problem:   Hyperosmolar hyperglycemic state (HHS) with noncompliance with meds - Increase Lantus to 80 U today as his fasting glucose is 277 - Last A1c on 07/28/2021 was 14.1-repeat A1c is > 15.5 - Have educated him on maintaining compliance  Active Problems:   AKI (acute kidney injury) ([place]) -Creatinine 1.37 has improved to 0.99 which is his baseline - IVF stopped on 3/14  Acute on chronic peripheral neuropathy - With numbness in his hands and difficulty picking up things- he is able to feed himself- we discussed the connection of these symptoms with DM but since the symptoms in his feet are not as severe as his hands - waiting on MRI of his c spine - Continue Lyrica, which he takes at home, and follow  Hypokalemia - Potassium 3.1> 4.0 after replacement    History of rheumatoid arthritis -Continue Plaquenil  Bipolar disorder - Continue Latuda, duloxetine, venlafaxine and Lamictal    Tobacco abuse He smokes 1-1/2 packs/day - cont nicotine patch - counseled   Hypertension - Resumed losartan  The patient is on Plavix and this has been resumed       Code Status: Full Code Consultants: none Level of Care: Level of care: Telemetry Total time on patient care: 35 min DVT  prophylaxis:  SCDs Start: 05/23/22 2123     Objective:   Vitals:   05/26/22 0600 05/26/22 0700 05/26/22 0800 05/26/22 0900  BP: 97/61 131/67 96/70 91/66   Pulse: 78 80 80   Resp: 12 15 17 19   Temp:      TempSrc:      SpO2: 94% 95% 98%   Weight:      Height:       Filed Weights   05/23/22 2337 05/24/22 0500 05/26/22 0340  Weight: 63.4 kg 58.4 kg 54 kg   Exam: General exam: Appears comfortable  HEENT: oral mucosa moist Respiratory system: Clear to auscultation.  Cardiovascular system: S1 & S2 heard  Gastrointestinal system: Abdomen soft, non-tender, nondistended. Normal bowel sounds   Extremities: No cyanosis, clubbing or edema Psychiatry:  Mood & affect appropriate.     CBC: Recent Labs  Lab 05/23/22 1711 05/24/22 0254  WBC 6.0 8.1  NEUTROABS  --  3.7  HGB 11.9* 11.6*  HCT 35.6* 32.4*  MCV 86.0 80.2  PLT 238 Q000111Q    Basic Metabolic Panel: Recent Labs  Lab 05/23/22 1711 05/23/22 2348 05/24/22 0254 05/24/22 0851 05/25/22 0305  NA 121* 132* 134* 135 133*  K 4.5 3.1* 3.1* 3.7 4.0  CL 89* 103 101 103 103  CO2 21* 22 24 22 22   GLUCOSE 1,184* 244* 220* 146* 389*  BUN 17 14 14 12 14   CREATININE 1.37* 0.99 0.92 0.85 1.00  CALCIUM 8.2* 8.5* 8.8* 8.4*  8.3*  MG  --  1.8 2.1  --   --   PHOS  --   --  4.5  --   --     GFR: Estimated Creatinine Clearance: 64.5 mL/min (by C-G formula based on SCr of 1 mg/dL).  Scheduled Meds:  Chlorhexidine Gluconate Cloth  6 each Topical Daily   clopidogrel  75 mg Oral Daily   DULoxetine  60 mg Oral Daily   fenofibrate  160 mg Oral Daily   hydroxychloroquine  200 mg Oral Daily   insulin aspart  0-15 Units Subcutaneous TID WC   insulin aspart  0-5 Units Subcutaneous QHS   insulin glargine-yfgn  80 Units Subcutaneous Daily   lamoTRIgine  150 mg Oral BID   losartan  25 mg Oral Daily   lurasidone  20 mg Oral QHS   lurasidone  80 mg Oral Q breakfast   mupirocin ointment  1 Application Nasal BID   pantoprazole  40 mg Oral  Daily   pregabalin  200 mg Oral Daily   pregabalin  400 mg Oral QHS   rosuvastatin  10 mg Oral Daily   tamsulosin  0.8 mg Oral QHS   Continuous Infusions:   Imaging and lab data was personally reviewed No results found.  LOS: 2 days   Author: Debbe Odea  05/26/2022 11:23 AM  To contact [facility]>   Check the care team in [facility] and look for the attending/consulting [place] provider listed  Log into [email or web address] and use [place]'s universal password   Go to> "[facility]"  and find provider  If you still have difficulty reaching the provider, please page the [facility] (Director on Call) for the Hospitalists listed on amion

## 2022-05-26 NOTE — Progress Notes (Signed)
Inpatient Rehab Admissions Coordinator:    CIR consult received; however, Pt. Worked with therapy this morning and they have updated their recommendations to [facility] PT. CIR will sign off.   Clemens Catholic, [place], [place] Admissions Coordinator  [phone] ([place]) [phone] (office)

## 2022-05-26 NOTE — TOC Initial Note (Addendum)
Transition of Care [name]) - Initial/Assessment Note    Patient Details  Name: Vincent [name] MRN: [id number] Date of Birth: 08-May-1965  [facility] CM/SW Contact:    [name], RN Phone Number: 05/26/2022, 1:37 PM  Clinical Narrative:    Per chart review patient currently in ICU or hyperglycemia. PT has recommended outpatient PT services. This RNCM spoke with patient at bedside to offer choice. Patient chose [facility], this RNCM will create referral for OPPT, await MD to cosign.   Transportation at discharge: patient's girlfriend  TOC will continue to follow for discharge needs.           Expected Discharge Plan: OP Rehab Barriers to Discharge: Continued Medical Work up   Patient Goals and CMS Choice Patient states their goals for this hospitalization and ongoing recovery are:: return home CMS Medicare.gov Compare Post Acute Care list provided to:: Patient Choice offered to / list presented to : [name] ownership interest in [facility].provided to:: Patient    Expected Discharge Plan and Services In-house Referral: NA Discharge Planning Services: CM Consult Post Acute Care Choice:  (outpatient physical therapy) Living arrangements for the past 2 months: Single Family Home                 DME Arranged: N/A DME Agency: NA       HH Arranged: NA [place] Agency: NA        Prior Living Arrangements/Services Living arrangements for the past 2 months: [place] Lives with:: Significant Other Patient language and need for interpreter reviewed:: Yes Do you feel safe going back to the place where you live?: Yes      Need for Family Participation in Patient Care: No (Comment) Care giver support system in place?: Yes (comment) Current home services: DME (cane, walker, w/c, elevated commode, shower seat, eye glasses) Criminal Activity/Legal Involvement Pertinent to Current Situation/Hospitalization: No - Comment as needed  Activities  of Daily Living Home Assistive Devices/Equipment: Eyeglasses, Cane (specify quad or straight), Walker (specify type) (straight cane (at bedside); standard walker (at home)) ADL Screening (condition at time of admission) Patient's cognitive ability adequate to safely complete daily activities?: Yes Is the patient deaf or have difficulty hearing?: No Does the patient have difficulty seeing, even when wearing glasses/contacts?: No Does the patient have difficulty concentrating, remembering, or making decisions?: No Patient able to express need for assistance with ADLs?: Yes Does the patient have difficulty dressing or bathing?: No Independently performs ADLs?: Yes (appropriate for developmental age) Does the patient have difficulty walking or climbing stairs?: Yes Weakness of Legs: Both Weakness of Arms/Hands: Both  Permission Sought/Granted Permission sought to share information with : Case Manager Permission granted to share information with : Yes, Verbal Permission Granted  Share Information with NAME: Case manager           Emotional Assessment Appearance:: Appears stated age Attitude/Demeanor/Rapport: Gracious Affect (typically observed): Accepting Orientation: : Oriented to Self, Oriented to Place, Oriented to  Time, Oriented to Situation Alcohol / Substance Use: Tobacco Use Psych Involvement: No (comment)  Admission diagnosis:  Hyperglycemia [R73.9] Generalized weakness [R53.1] Hyperosmolar hyperglycemic state (HHS) ([place]) [E11.00] Patient Active Problem List   Diagnosis Date Noted   Hyperosmolar hyperglycemic state (HHS) ([place]) 05/24/2022   Essential hypertension 05/23/2022   BPH (benign prostatic hyperplasia) 05/23/2022   Diabetic hyperosmolar non-ketotic state ([place]) 04/05/2022   AKI (acute kidney injury) ([place]) 09/04/2021   Hypotension 09/03/2021   History of seizure  Q000111Q   Acute metabolic encephalopathy Q000111Q   Suicide attempt by drug overdose ([place])  07/28/2021   Cerebral thrombosis with cerebral infarction 11/16/2020   Stroke-like symptoms 11/14/2020   Hyperkalemia 11/14/2020   Ischemic stroke ([place]) 06/10/2020   Hyperglycemia    Gangrene of finger of left hand (HCC)    Pain due to onychomycosis of toenails of both feet 01/28/2020   Hyperglycemia due to type 2 diabetes mellitus ([place]) 01/07/2020   Resistance to insulin 01/07/2020   Right leg weakness 04/24/2019   Right leg pain 04/24/2019   Gait abnormality 04/24/2019   TIA (transient ischemic attack) 10/20/2018   DM2 (diabetes mellitus, type 2) ([place]) 10/19/2018   Left sided numbness 10/19/2018   Diabetic autonomic neuropathy associated with type 2 diabetes mellitus ([place]) 02/01/2018   DM type 2 with diabetic peripheral neuropathy ([place]) 02/01/2018   Dyslipidemia 01/31/2018   Coronary artery disease involving native coronary artery of native heart without angina pectoris 01/31/2018   Chest pain 01/01/2018   Breakthrough seizure ([place]) 12/19/2016   Essential tremor 12/19/2016   Chronic post-traumatic stress disorder (PTSD) 06/16/2016   Tremor 06/16/2016   Tobacco abuse 05/05/2016   Altered mental status 05/28/2015   Acute bronchitis 05/28/2015   Type 2 diabetes mellitus with hyperglycemia ([place]) 05/28/2015   Hypothyroidism 05/28/2015   Bipolar 2 disorder ([place]) 05/28/2015   History of rheumatoid arthritis 05/28/2015   Chronic pain 05/28/2015   Bipolar II disorder, most recent episode major depressive ([place]) 03/18/2015   Carpal tunnel syndrome on left 02/10/2015   Carpal tunnel syndrome on right 02/10/2015   Diabetes ([place]) 01/23/2015   Obesity (BMI 30-39.9) 03/06/2014   Acute encephalopathy 03/05/2014   Hyperlipidemia 03/05/2014   Anemia, normocytic normochromic 03/05/2014   Altered mental state    Helicobacter pylori (H. pylori) infection 11/20/2012   Protein-calorie malnutrition, severe ([place]) 10/09/2012   Dysphagia 08/19/2012   Gastroparesis 08/19/2012   Bladder tumor  08/08/2012   Biliary dyskinesia 11/02/2010   PCP:  Hayden Rasmussen, MD Pharmacy:   [facility] [place], [place] - [address] [place] [address] Phone: [phone] Fax: [phone]     Social Determinants of Health (SDOH) Social History: SDOH Screenings   Food Insecurity: No Food Insecurity (05/23/2022)  Housing: Low Risk  (05/23/2022)  Transportation Needs: No Transportation Needs (05/23/2022)  Utilities: Not At Risk (05/23/2022)  Alcohol Screen: Low Risk  (07/31/2021)  Tobacco Use: High Risk (05/23/2022)   SDOH Interventions:     Readmission Risk Interventions    05/26/2022    1:26 PM  Readmission Risk Prevention Plan  PCP or Specialist Appt within 3-5 Days Complete  HRI or [place] Complete  Social Work Consult for [place] Planning/Counseling Complete  Palliative Care Screening Not Applicable  Medication Review Press photographer) Complete

## 2022-05-26 NOTE — Progress Notes (Signed)
Physical Therapy Treatment Patient Details Name: Vincent [name] MRN: [id number] DOB: 07-Apr-1965 Today's Date: 05/26/2022   History of Present Illness This is a 57 year old male with diabetes mellitus type 2 who is noncompliant with medication, essential hypertension and hyperlipidemia who presented 05/23/22  to the hospital with generalized weakness, diarrhea and feeling lightheaded.  In the ED he was noted to have hyperglycemia and CBG showed a glucose of 1184.  Sodium was 121    PT Comments    Patient has made significant progress in balance and Hand function since last PT visit.  Patient reports improved use of hands: "I can feed myself".  Patient  is supervision to ambulate  220' using RW.  Short distance ambulation in room without RW from BR.  Patient prefers to go to OPPT. List of [place] providers given to patient.   Recommendations for follow up therapy are one component of a multi-disciplinary discharge planning process, led by the attending physician.  Recommendations may be updated based on patient status, additional functional criteria and insurance authorization.  Follow Up Recommendations  Outpatient PT-Patient preference. Can patient physically be transported by private vehicle: Yes   Assistance Recommended at Discharge Set up Supervision/Assistance  Patient can return home with the following  Intermittent support   Equipment Recommendations  None recommended by PT    Recommendations for Other Services       Precautions / Restrictions Precautions Precautions: Fall Precaution Comments: hands and lower legs numb     Mobility  Bed Mobility               General bed mobility comments: up in chair  Independent to return to supine  Transfers   Equipment used: Rolling walker (2 wheels) Transfers: Sit to/from Stand Sit to Stand: Supervision                Ambulation/Gait Ambulation/Gait assistance: Min guard Gait Distance (Feet): 220  Feet Assistive device: Rolling walker (2 wheels) Gait Pattern/deviations: Step-through pattern, Decreased dorsiflexion - left Gait velocity: decr     General Gait Details: left foot slap, gait steady with RW, ambulated x 15' without AD, tended to support on objects   Stairs             Wheelchair Mobility    Modified Rankin (Stroke Patients Only)       Balance Overall balance assessment: History of Falls, Needs assistance   Sitting balance-Leahy Scale: Good     Standing balance support: No upper extremity supported Standing balance-Leahy Scale: Poor Standing balance comment: reliant on RW                            Cognition Arousal/Alertness: Awake/alert Behavior During Therapy: WFL for tasks assessed/performed Overall Cognitive Status: Within Functional Limits for tasks assessed                                 General Comments: much brighter and reports feels better        Exercises      General Comments        Pertinent Vitals/Pain Pain Assessment Pain Assessment: No/denies pain    Home Living                          Prior Function            PT  Goals (current goals can now be found in the care plan section) Progress towards PT goals: Progressing toward goals    Frequency    Min 3X/week      PT Plan Discharge plan needs to be updated    Co-evaluation              AM-PAC PT "6 Clicks" Mobility   Outcome Measure  Help needed turning from your back to your side while in a flat bed without using bedrails?: None Help needed moving from lying on your back to sitting on the side of a flat bed without using bedrails?: None Help needed moving to and from a bed to a chair (including a wheelchair)?: A Little Help needed standing up from a chair using your arms (e.g., wheelchair or bedside chair)?: A Little Help needed to walk in hospital room?: A Little Help needed climbing 3-5 steps with a  railing? : A Lot 6 Click Score: 19    End of Session Equipment Utilized During Treatment: Gait belt Activity Tolerance: Patient tolerated treatment well Patient left: in bed;with call bell/phone within reach;with bed alarm set Nurse Communication: Mobility status PT Visit Diagnosis: Unsteadiness on feet (R26.81);Repeated falls (R29.6);History of falling (Z91.81);Difficulty in walking, not elsewhere classified (R26.2);Other symptoms and signs involving the nervous system (R29.898)     Time: 1130-1155 PT Time Calculation (min) (ACUTE ONLY): 25 min  Charges:  $Gait Training: 23-37 mins                     [place] Office [phone] Weekend [id number]    Claretha Cooper 05/26/2022, 12:29 PM

## 2022-05-26 NOTE — Progress Notes (Signed)
[facility]  CONSULT SERVICE NOTE   Pt's chart reviewed. He is a 57 yo male with poorly controlled diabetes who presented on 05/23/22 weakness, malaise, and diarrhea. He was found to have a CBG of 1184 and a Na+ of 121. Hgb A1C was 14.1! He was placed on IVF fluids and insulin and sugars are better controlled but sill remain in the 300's. Pt c/o numbness in hands and distal lower extremities as well as ongoing weakness, left > right. The weakness is not all entirely new as he has been experiencing symptoms for several months apparently. Prior to arrival he ambulating with a walker and could perform ADL's. His girlfriend had recently moved in with him to help him out. Pt was most recently seen by therapy yesterday and was min assist for sit-stand transfers and could march in place for 15 seconds. He hasn't tried ambulating yet.    Assessment: Acute on chronic functional deficits due to poorly controlled diabetes and subsequent hypermolar hyperglycemic state.  Co-morbidities include: HTN, Acute Kidney Injury, Hypokalemia, bipolar disorder, rheumatoid arthritis, diabetic peripheral neuropathy   Recs: Pt could benefit from a 15-20 day inpatient rehab admission to address his new functional deficits which are a substantial exacerbation of his pre-existing deficits related to his rheumatoid arthritis and progressive diabetic peripheral neuropathy. Goals would be supervision to min assist for PT and OT He appears to have adequate assistance at home to provide needed help once discharged.  Rehab Admissions Coordinator to follow up    Thanks,  Meredith Staggers, MD, [place] Director Rehabilitation Services 05/26/2022

## 2022-05-26 NOTE — Inpatient Diabetes Management (Signed)
Inpatient Diabetes Program Recommendations  AACE/ADA: New Consensus Statement on Inpatient Glycemic Control (2015)  Target Ranges:  Prepandial:   less than 140 mg/dL      Peak postprandial:   less than 180 mg/dL (1-2 hours)      Critically ill patients:  140 - 180 mg/dL   Lab Results  Component Value Date   GLUCAP 346 (H) 05/26/2022   HGBA1C >15.5 (H) 05/23/2022    Review of Glycemic Control  Diabetes history: DM2  Current orders for Inpatient glycemic control: Semglee 80 QD, Novolog 0-15 TID with meals and 0-5 HS  HgbA1C - > 15.5%  Inpatient Diabetes Program Recommendations:    Spoke with pt at bedside regarding his diabetes control at home. Pt states he has [employer] CGM at home and has no problem in getting his insulin. States he will take insulin as prescribed now. We discussed HgbA1C and glucose goals. Pt will f/u with PCP after discharge.   Thank you. Lorenda Peck, RD, LDN, [place] Inpatient Diabetes Coordinator [phone]

## 2022-05-27 LAB — GLUCOSE, CAPILLARY
Glucose-Capillary: 284 mg/dL — ABNORMAL HIGH (ref 70–99)
Glucose-Capillary: 188 mg/dL — ABNORMAL HIGH (ref 70–99)

## 2022-05-27 MED ORDER — TOUJEO MAX SOLOSTAR 300 UNIT/ML ~~LOC~~ SOPN: 100.0000 [IU] | PEN_INJECTOR | Freq: Every day | SUBCUTANEOUS | 0 refills | Status: DC

## 2022-05-27 NOTE — Plan of Care (Signed)

## 2022-05-27 NOTE — TOC Transition Note (Signed)
Transition of Care [facility]) - CM/SW Discharge Note   Patient Details  Name: Vincent [name] MRN: [id number] Date of Birth: October 17, 1965  [facility] CM/SW Contact:  Henrietta Dine, RN Phone Number: 05/27/2022, 11:35 AM   Clinical Narrative:    D/C orders received; spoke w/ pt in room; he agrees to d/c plan w/ OPPT; he verbalized understanding; no TOC needs.   Final next level of care: Home/Self Care (home w/ OPPT; referral placed) Barriers to Discharge: No Barriers Identified   Patient Goals and CMS Choice CMS Medicare.gov Compare Post Acute Care list provided to:: Patient Choice offered to / list presented to : Patient  Discharge Placement                         Discharge Plan and Services Additional resources added to the After Visit Summary for   In-house Referral: NA Discharge Planning Services: CM Consult Post Acute Care Choice:  (outpatient physical therapy)          DME Arranged: N/A DME Agency: NA       HH Arranged: NA HH Agency: NA        Social Determinants of Health (SDOH) Interventions SDOH Screenings   Food Insecurity: No Food Insecurity (05/23/2022)  Housing: Low Risk  (05/23/2022)  Transportation Needs: No Transportation Needs (05/23/2022)  Utilities: Not At Risk (05/23/2022)  Alcohol Screen: Low Risk  (07/31/2021)  Tobacco Use: High Risk (05/23/2022)     Readmission Risk Interventions    05/26/2022    1:26 PM  Readmission Risk Prevention Plan  PCP or Specialist Appt within 3-5 Days Complete  HRI or [place] Complete  Social Work Consult for [place] Planning/Counseling Complete  Palliative Care Screening Not Applicable  Medication Review Press photographer) Complete

## 2022-05-27 NOTE — Discharge Planning (Incomplete)
Physician Discharge Summary  Vincent [name] [id number] DOB: 11/07/1965 DOA: 05/23/2022  PCP: Hayden Rasmussen, MD  Admit date: 05/23/2022 Discharge date: 05/27/2022 Discharging to: *** Recommendations for Outpatient Follow-up:  ***  Consults:  *** Procedures:  ***   Discharge Diagnoses:   Principal Problem:   Hyperosmolar hyperglycemic state (HHS) ([place]) Active Problems:   AKI (acute kidney injury) ([place])   Bipolar 2 disorder ([place])   Hyperlipidemia   History of rheumatoid arthritis   Tobacco abuse   DM2 (diabetes mellitus, type 2) ([place])   Hyperglycemia   Essential hypertension   BPH (benign prostatic hyperplasia)     Hospital Course:  ***  Principal Problem:   Hyperosmolar hyperglycemic state (HHS) ([place]) Active Problems:   AKI (acute kidney injury) ([place])   Bipolar 2 disorder ([place])   Hyperlipidemia   History of rheumatoid arthritis   Tobacco abuse   DM2 (diabetes mellitus, type 2) ([place])   Hyperglycemia   Essential hypertension   BPH (benign prostatic hyperplasia)    Body mass index is 25.71 kg/m. Nutrition Status:          Discharge Instructions  Discharge Instructions     Ambulatory referral to Physical Therapy   Complete by: As directed    Diet - low sodium heart healthy   Complete by: As directed    Diet Carb Modified   Complete by: As directed    Increase activity slowly   Complete by: As directed       Allergies as of 05/27/2022       Reactions   Celecoxib Anaphylaxis, Swelling, Rash   Tongue swells   Hydrocodone Rash, Other (See Comments)   "blisters developed on arms"   Sulfa Antibiotics Rash   Sulfacetamide Sodium Rash        Medication List     STOP taking these medications    amoxicillin-clavulanate 875-125 MG tablet Commonly known as: AUGMENTIN   azithromycin 250 MG tablet Commonly known as: ZITHROMAX       TAKE these medications    acetaminophen 500 MG tablet Commonly known as: TYLENOL Take 1,000 mg by  mouth every 4 (four) hours as needed for moderate pain or headache.   clopidogrel 75 MG tablet Commonly known as: PLAVIX Take 1 tablet (75 mg total) by mouth daily.   DULoxetine 60 MG capsule Commonly known as: CYMBALTA Take 1 capsule (60 mg total) by mouth daily.   fenofibrate 160 MG tablet Take 160 mg by mouth at bedtime.   HumaLOG KwikPen 200 UNIT/ML KwikPen Generic drug: insulin lispro Inject 10 Units into the skin in the morning, at noon, and at bedtime.   hydroxychloroquine 200 MG tablet Commonly known as: PLAQUENIL Take 200 mg by mouth daily.   lamoTRIgine 150 MG tablet Commonly known as: LAMICTAL Take 1 tablet (150 mg total) by mouth 2 (two) times daily.   losartan 25 MG tablet Commonly known as: COZAAR Take 25 mg by mouth daily.   lurasidone 20 MG Tabs tablet Commonly known as: LATUDA Take 20 mg by mouth at bedtime.   lurasidone 80 MG Tabs tablet Commonly known as: Latuda Take 1 tablet (80 mg total) by mouth daily with breakfast.   omeprazole 40 MG capsule Commonly known as: PRILOSEC Take 40 mg by mouth daily.   pregabalin 200 MG capsule Commonly known as: LYRICA Take 200-400 mg by mouth in the morning and at bedtime. Take 200 mg by mouth in the morning and 400 mg by mouth at night  rosuvastatin 10 MG tablet Commonly known as: CRESTOR Take 1 tablet (10 mg total) by mouth daily. What changed: when to take this   tamsulosin 0.4 MG Caps capsule Commonly known as: FLOMAX Take 0.4 mg by mouth at bedtime.   Toujeo Max SoloStar 300 UNIT/ML Solostar Pen Generic drug: insulin glargine (2 Unit Dial) Inject 100 Units into the skin daily. What changed: how much to take   traZODone 150 MG tablet Commonly known as: DESYREL Take 1 tablet (150 mg total) by mouth at bedtime as needed for sleep.   venlafaxine XR 150 MG 24 hr capsule Commonly known as: EFFEXOR-XR Take 1 capsule (150 mg total) by mouth daily with breakfast.   Vitamin D (Ergocalciferol) 1.25 MG  (50000 UNIT) Caps capsule Commonly known as: DRISDOL Take 50,000 Units by mouth every Wednesday.            The results of significant diagnostics from this hospitalization (including imaging, microbiology, ancillary and laboratory) are listed below for reference.    MR CERVICAL SPINE WO CONTRAST  Result Date: 05/26/2022 CLINICAL DATA:  Cervical radiculopathy, retroflexed. EXAM: MRI CERVICAL SPINE WITHOUT CONTRAST TECHNIQUE: Multiplanar, multisequence MR imaging of the cervical spine was performed. No intravenous contrast was administered. COMPARISON:  MRI of the cervical spine June 06, 2021. FINDINGS: Alignment: Physiologic. Vertebrae: No fracture, evidence of discitis, or aggressive bone lesion. Hemangioma in the T3 vertebral body. Cord: Normal signal and morphology. Posterior Fossa, vertebral arteries, paraspinal tissues: Negative. Disc levels: C2-3: No spinal canal or neural foraminal stenosis. C3-4:Facet degenerative changes with associated facet fusion on the left side. No significant spinal canal or neural foraminal stenosis. C4-5: No spinal canal or neural foraminal stenosis. C5-6:Disc bulge causing mild indentation on the thecal sac without significant spinal canal or neural foraminal stenosis. No significant change since prior MRI. C6-7: Minimal disc bulge and mild uncovertebral degenerative changes resulting in minimal bilateral neural foraminal narrowing, new since prior MRI. No spinal canal stenosis. C7-T1: No spinal canal or neural foraminal stenosis. IMPRESSION: Mild degenerative changes of the cervical spine without high-grade spinal canal or neural foraminal stenosis at any level. Electronically Signed   By: Pedro Earls M.D.   On: 05/26/2022 10:37   DG Chest Port 1 View  Result Date: 05/23/2022 CLINICAL DATA:  Hyperglycemia EXAM: PORTABLE CHEST 1 VIEW COMPARISON:  04/05/2022 FINDINGS: Stable cardiomediastinal silhouette. Aortic atherosclerotic calcification. No  focal consolidation, pleural effusion, or pneumothorax. No acute osseous abnormality. Low lung volumes IMPRESSION: No active disease. Electronically Signed   By: Placido Sou M.D.   On: 05/23/2022 23:54   CT Head Wo Contrast  Result Date: 05/23/2022 CLINICAL DATA:  Weakness, acute stroke suspected. EXAM: CT HEAD WITHOUT CONTRAST TECHNIQUE: Contiguous axial images were obtained from the base of the skull through the vertex without intravenous contrast. RADIATION DOSE REDUCTION: This exam was performed according to the departmental dose-optimization program which includes automated exposure control, adjustment of the mA and/or kV according to patient size and/or use of iterative reconstruction technique. COMPARISON:  None Available. FINDINGS: Brain: No acute intracranial hemorrhage. No focal mass lesion. No CT evidence of acute infarction. No midline shift or mass effect. No hydrocephalus. Basilar cisterns are patent. Vascular: No hyperdense vessel or unexpected calcification. Skull: Normal. Negative for fracture or focal lesion. Sinuses/Orbits: Paranasal sinuses and mastoid air cells are clear. Orbits are clear. Other: None. IMPRESSION: No acute intracranial findings.  No CT evidence of acute infarction. Electronically Signed   By: Helane Gunther.D.  On: 05/23/2022 19:46   Labs:   Basic Metabolic Panel: Recent Labs  Lab 05/23/22 1711 05/23/22 2348 05/24/22 0254 05/24/22 0851 05/25/22 0305  NA 121* 132* 134* 135 133*  K 4.5 3.1* 3.1* 3.7 4.0  CL 89* 103 101 103 103  CO2 21* 22 24 22 22   GLUCOSE 1,184* 244* 220* 146* 389*  BUN 17 14 14 12 14   CREATININE 1.37* 0.99 0.92 0.85 1.00  CALCIUM 8.2* 8.5* 8.8* 8.4* 8.3*  MG  --  1.8 2.1  --   --   PHOS  --   --  4.5  --   --      CBC: Recent Labs  Lab 05/23/22 1711 05/24/22 0254  WBC 6.0 8.1  NEUTROABS  --  3.7  HGB 11.9* 11.6*  HCT 35.6* 32.4*  MCV 86.0 80.2  PLT 238 223         SIGNED:   Debbe Odea, MD  [facility] 05/27/2022, 10:37 AM

## 2022-05-30 ENCOUNTER — Ambulatory Visit: Payer: Medicare HMO | Attending: Internal Medicine | Admitting: Physical Therapy

## 2022-05-30 ENCOUNTER — Telehealth: Payer: Self-pay | Admitting: Physical Therapy

## 2022-05-30 ENCOUNTER — Encounter: Payer: Self-pay | Admitting: Physical Therapy

## 2022-05-30 VITALS — BP 116/67 | HR 92

## 2022-05-30 DIAGNOSIS — M6281 Muscle weakness (generalized): Secondary | ICD-10-CM

## 2022-05-30 DIAGNOSIS — R739 Hyperglycemia, unspecified: Secondary | ICD-10-CM | POA: Diagnosis not present

## 2022-05-30 DIAGNOSIS — R2689 Other abnormalities of gait and mobility: Secondary | ICD-10-CM | POA: Diagnosis present

## 2022-05-30 DIAGNOSIS — R2681 Unsteadiness on feet: Secondary | ICD-10-CM

## 2022-05-30 DIAGNOSIS — R29898 Other symptoms and signs involving the musculoskeletal system: Secondary | ICD-10-CM | POA: Insufficient documentation

## 2022-05-30 DIAGNOSIS — R200 Anesthesia of skin: Secondary | ICD-10-CM | POA: Diagnosis not present

## 2022-05-30 DIAGNOSIS — R208 Other disturbances of skin sensation: Secondary | ICD-10-CM | POA: Diagnosis present

## 2022-05-30 DIAGNOSIS — Z9181 History of falling: Secondary | ICD-10-CM | POA: Insufficient documentation

## 2022-05-30 NOTE — Telephone Encounter (Signed)
Gave to pt to give to his PCP (has appt on 05/30/22 at [facility])  Dr. Darron Doom, Cammy Brochure was evaluated by Physical Therapy on 05/30/22.  The patient would benefit from an OT evaluation for decr sensation, decr coordination/fine motor tasks and weakness in BUE.  He would also benefit from an order for a 2 wheeled rolling walker in order to improve safety with gait and decr fall risk. In order for insurance to cover this, it will also need to be documented in your note why he would need a RW (to decr risk of falls).     If you agree, please place these orders in [facility] workque in [facility] or fax the order to [phone].  Thank you so much, Janann August, PT, DPT 05/30/22 12:27 PM    [place] [address] [place], [place]  [address] Phone:  [phone] Fax:  [phone]

## 2022-05-30 NOTE — Therapy (Signed)
OUTPATIENT PHYSICAL THERAPY NEURO EVALUATION   Patient Name: Vincent [name] MRN: [id number] DOB:1966/02/19, 57 y.o., male Today's Date: 05/30/2022   PCP: Hayden Rasmussen, MD  REFERRING PROVIDER: Debbe Odea, MD   END OF SESSION:  PT End of Session - 05/30/22 1308     Visit Number 1    Number of Visits 17    Date for PT Re-Evaluation 07/29/22    Authorization Type [employer]    PT Start Time [id number]    PT Stop Time 1230    PT Time Calculation (min) 45 min    Equipment Utilized During Treatment Gait belt    Activity Tolerance Patient tolerated treatment well    Behavior During Therapy [facility] for tasks assessed/performed             Past Medical History:  Diagnosis Date   Anxiety    Benign essential tremor    Bipolar 2 disorder ([place])    followed by [facility]--- dr s. Adele Schilder   Bladder cancer [facility])    recurrent   CAD (coronary artery disease)    cardiac cath 2003  and 2011 both showed normal coronary arteries w/ preserved lvf;  Non obstructive on CTA Oct 2019.    Chronic pain syndrome    back---- [facility] [place]   Cold extremities    BLE   COPD (chronic obstructive pulmonary disease) ([place])    DDD (degenerative disc disease), lumbar    Diabetic peripheral neuropathy ([place])    Gastroparesis    followed by dr Henrene Pastor   GERD (gastroesophageal reflux disease)    Hiatal hernia    History of bladder cancer urologist-  previously dr Consuella Lose;  now dr gay   papillay TCC (Ta G1)  s/p TURBT and chemo instillation 2014   History of chest pain 12/2017   heart cath normal   History of encephalopathy 05/27/2015   admission w/ acute encephalopathy thought to be secondary to pain meds and COPD   History of gastric ulcer    History of Helicobacter pylori infection    History of kidney stones    History of TIA (transient ischemic attack) 2008  and 10-19-2018    no residual's   History of traumatic head injury 2010   w/ LOC  per pt needed stitches, hit in head  with a mower blade   Hyperlipidemia    Hypertension    Hypogonadism male    s/p  bilateral orchiectomy   Hypothyroidism    Insomnia    Mild obstructive sleep apnea    study in epic 12-04-2016, no cpap   PTSD (post-traumatic stress disorder)    chronic   PTSD (post-traumatic stress disorder)    RA (rheumatoid arthritis) ([place])    followed by [facility]   Seizures, transient [facility]) neurologist-  dr Krista Blue--  differential dx complex partial seizure .vs.  mood disorder .vs.  pseudoseizure--  negative EEG's   confusion episodes and staring spells since 11/ 2015   (03-26-2020 per pt wife last seizure 10 /2021)   Transient confusion NEUOROLOGIST-  DR Krista Blue   Episodes since 11/ 2015--  neurologist dx  differential complex partial seizure  .vs. mood disorder . vs. pseudoseizure   Type 2 diabetes mellitus treated with insulin [facility])    endocrinologist--- dr Loanne Drilling---  (03-26-2020 pt does not check blood sugar at home)   Past Surgical History:  Procedure Laterality Date   AMPUTATION Left 04/28/2020   Procedure: LEFT LITTLE FINGER AMPUTATION;  Surgeon: Newt Minion, MD;  Location: [place];  Service: Orthopedics;  Laterality: Left;   CARDIAC CATHETERIZATION  12-27-2001  DR Einar Gip  &  05-26-2009  DR Irish Lack   RESULTS FOR BOTH ARE NORMAL CORONARIES AND PERSERVED LVF/ EF 60%   CARPAL TUNNEL RELEASE Bilateral right 09-16-2003;  left ?   CARPAL TUNNEL RELEASE Left 02/25/2015   Procedure: LEFT CARPAL TUNNEL RELEASE;  Surgeon: Leanora Cover, MD;  Location: [place];  Service: Orthopedics;  Laterality: Left;   CYSTOSCOPY N/A 10/10/2012   Procedure: CYSTOSCOPY CLOT EVACUATION FULGERATION OF BLEEDERS ;  Surgeon: Claybon Jabs, MD;  Location: [facility];  Service: Urology;  Laterality: N/A;   CYSTOSCOPY WITH BIOPSY N/A 11/26/2015   Procedure: CYSTOSCOPY WITH BIOPSY AND FULGURATION;  Surgeon: Kathie Rhodes, MD;  Location: [place];  Service: Urology;   Laterality: N/A;   ESOPHAGOGASTRODUODENOSCOPY  2014   LAPAROSCOPIC CHOLECYSTECTOMY  11-17-2010   ORCHIECTOMY Right 02/21/2016   Procedure: SCROTAL ORCHIECTOMY with TESTICULAR PROSTHESIS IMPLANT;  Surgeon: Kathie Rhodes, MD;  Location: [facility];  Service: Urology;  Laterality: Right;   ORCHIECTOMY Left 09/02/2018   Procedure: ORCHIECTOMY;  Surgeon: Kathie Rhodes, MD;  Location: [facility];  Service: Urology;  Laterality: Left;   ROTATOR CUFF REPAIR Right 12/2004   TRANSURETHRAL RESECTION OF BLADDER TUMOR N/A 08/09/2012   Procedure: TRANSURETHRAL RESECTION OF BLADDER TUMOR (TURBT) WITH GYRUS WITH MITOMYCIN C;  Surgeon: Claybon Jabs, MD;  Location: [facility];  Service: Urology;  Laterality: N/A;   TRANSURETHRAL RESECTION OF BLADDER TUMOR N/A 03/29/2020   Procedure: TRANSURETHRAL RESECTION OF BLADDER TUMOR (TURBT) and post-op instillation of gemcitabine;  Surgeon: Janith Lima, MD;  Location: [facility];  Service: Urology;  Laterality: N/A;   TRANSURETHRAL RESECTION OF BLADDER TUMOR WITH GYRUS (TURBT-GYRUS) N/A 02/27/2014   Procedure: TRANSURETHRAL RESECTION OF BLADDER TUMOR WITH GYRUS (TURBT-GYRUS);  Surgeon: Claybon Jabs, MD;  Location: [facility];  Service: Urology;  Laterality: N/A;   Patient Active Problem List   Diagnosis Date Noted   Hyperosmolar hyperglycemic state (HHS) ([place]) 05/24/2022   Essential hypertension 05/23/2022   BPH (benign prostatic hyperplasia) 05/23/2022   Diabetic hyperosmolar non-ketotic state ([place]) 04/05/2022   AKI (acute kidney injury) ([place]) 09/04/2021   Hypotension 09/03/2021   History of seizure [id number]   Acute metabolic encephalopathy Q000111Q   Suicide attempt by drug overdose ([place]) 07/28/2021   Cerebral thrombosis with cerebral infarction 11/16/2020   Stroke-like symptoms 11/14/2020   Hyperkalemia 11/14/2020   Ischemic stroke ([place]) 06/10/2020   Hyperglycemia     Gangrene of finger of left hand (H[facility])    Pain due to onychomycosis of toenails of both feet 01/28/2020   Hyperglycemia due to type 2 diabetes mellitus ([place]) 01/07/2020   Resistance to insulin 01/07/2020   Right leg weakness 04/24/2019   Right leg pain 04/24/2019   Gait abnormality 04/24/2019   TIA (transient ischemic attack) 10/20/2018   DM2 (diabetes mellitus, type 2) ([place]) 10/19/2018   Left sided numbness 10/19/2018   Diabetic autonomic neuropathy associated with type 2 diabetes mellitus ([place]) 02/01/2018   DM type 2 with diabetic peripheral neuropathy ([place]) 02/01/2018   Dyslipidemia 01/31/2018   Coronary artery disease involving native coronary artery of native heart without angina pectoris 01/31/2018   Chest pain 01/01/2018   Breakthrough seizure ([place]) 12/19/2016   Essential tremor 12/19/2016   Chronic post-traumatic stress disorder (PTSD) 06/16/2016   Tremor 06/16/2016  Tobacco abuse 05/05/2016   Altered mental status 05/28/2015   Acute bronchitis 05/28/2015   Type 2 diabetes mellitus with hyperglycemia ([place]) 05/28/2015   Hypothyroidism 05/28/2015   Bipolar 2 disorder ([place]) 05/28/2015   History of rheumatoid arthritis 05/28/2015   Chronic pain 05/28/2015   Bipolar II disorder, most recent episode major depressive ([place]) 03/18/2015   Carpal tunnel syndrome on left 02/10/2015   Carpal tunnel syndrome on right 02/10/2015   Diabetes ([place]) 01/23/2015   Obesity (BMI 30-39.9) 03/06/2014   Acute encephalopathy 03/05/2014   Hyperlipidemia 03/05/2014   Anemia, normocytic normochromic 03/05/2014   Altered mental state    Helicobacter pylori (H. pylori) infection 11/20/2012   Protein-calorie malnutrition, severe ([place]) 10/09/2012   Dysphagia 08/19/2012   Gastroparesis 08/19/2012   Bladder tumor 08/08/2012   Biliary dyskinesia 11/02/2010    ONSET DATE: 05/26/2022  REFERRING DIAG: R73.9 (ICD-10-CM) - Hyperglycemia R29.898 (ICD-10-CM) - Right leg weakness R20.0 (ICD-10-CM) - Left  sided numbness    THERAPY DIAG:  Unsteadiness on feet  Other abnormalities of gait and mobility  History of falling  Muscle weakness (generalized)  Other disturbances of skin sensation  Rationale for Evaluation and Treatment: Rehabilitation  SUBJECTIVE:                                                                                                                                                                                             SUBJECTIVE STATEMENT: Went to the hospital on march 12th due to weakness - could not get up out of his couch. Got to the point where he couldn't walk anymore and had to go into the hospital. Lost feeling in your hands. Reports it is still all the way gone and hands feel cold all of the time. Has a lot of difficulty with his fine motor skills. Can't pick certain things up, eating is also a challenge. Can barely do anything with his L hand, also has a tremor in his L hand when he tries to do things (this is nothing new, has had this for 3-4 years). Has a RW and a cane at home, uses the RW all the time in the house. Has a heavy rollator - can't bring it down the stairs with him to get it in the car. Has been using this walker for a couple months now. Didn't bring in any AD to clinic today. Has had several falls - going down steps and also just losing his balance. Had railings put on his stairs - fell once with them and once without. When he steps down, since his legs are so weak, they will just buckle. Sees  his PCP  Pt accompanied by: self  PERTINENT HISTORY: PMH: HTN, diabetes mellitus type 2 who is noncompliant with medication, HTN, HLD, Acute Kidney Injury, Hypokalemia, bipolar disorder, rheumatoid arthritis, diabetic peripheral neuropathy, hx of TIAs, tobacco abuse, hx of CVA (pt reports it affected his L side)  Per Dr. Naaman Plummer note in hospital: Pt with poorly controlled diabetes who presented on 05/23/22 weakness, malaise, and diarrhea. He was found to have  a CBG (Capillary blood glucose) of 1184 and a Na+ of 121. Hgb A1C was 14.1! He was placed on IVF fluids and insulin and sugars are better controlled but sill remain in the 300's. Pt c/o numbness in hands and distal lower extremities as well as ongoing weakness, left > right. The weakness is not all entirely new as he has been experiencing symptoms for several months apparently. Prior to arrival he ambulating with a walker and could perform ADL's   PAIN:  Are you having pain? Yes: NPRS scale: 6/10 Pain location: Lower legs, arms and back Pain description: Constant, sharp  Aggravating factors: Nothing, stays the same unless getting exerted  Relieving factors: Not really   Vitals:   05/30/22 1206  BP: 116/67  Pulse: 92     PRECAUTIONS: Fall  WEIGHT BEARING RESTRICTIONS: No  FALLS: Has patient fallen in last 6 months? Yes. Number of falls 8, pt reports he has banged himself pretty good with these   LIVING ENVIRONMENT: Lives with:  girlfriend lives there 3x a week  Lives in: Mobile home Stairs: Yes: External: 6 steps; bilateral but cannot reach both Has following equipment at home: Single point cane, Walker - 2 wheeled, Environmental consultant - 4 wheeled, and shower chair  PLOF: Independent, Vocation/Vocational requirements: On disability (has been for years), and Leisure: going to [employer] races  pt reports he is still driving, Pt's girlfriend helps pt get dressed, take a shower, cooking him dinner, chores   PATIENT GOALS: Wants to get legs stronger, get better at feeding himself and feeling in hands   OBJECTIVE:   DIAGNOSTIC FINDINGS: CT head 05/23/22: IMPRESSION: No acute intracranial findings.  No CT evidence of acute infarction.    COGNITION: Overall cognitive status: Within functional limits for tasks assessed   SENSATION: Light touch: Impaired  and impaired more distally with RLE>LLE, unable to detect at lateral ankles bilat  Impaired RUE>LUE, esp left hand, can only detect 2/5 light touch   Proprioception: WFL and Able to detect ankle DF/PF at ankles    COORDINATION: Heel to shin: impaired LLE> RLE due to weakness   POSTURE: rounded shoulders and forward head  LOWER EXTREMITY ROM:     Limited AROM bilat due to weakness   LOWER EXTREMITY MMT:    MMT Right Eval Left Eval  Hip flexion 2+ 2+  Hip extension    Hip abduction 3 3  Hip adduction 3 3  Hip internal rotation    Hip external rotation    Knee flexion 3+ 3-  Knee extension 2+ 2+  Ankle dorsiflexion 2+ 2+  Ankle plantarflexion    Ankle inversion    Ankle eversion    (Blank rows = not tested)  Tested in sitting   BED MOBILITY:  Pt reports no difficulty getting in and out of bed.   TRANSFERS: Assistive device utilized: None  Sit to stand: CGA Stand to sit: CGA  Needs BUE support from chair, shaking in legs when standing, needing close CGA assist in standing for balance when pt lets go of chair, performs  with wide BOS   GAIT: Gait pattern: step through pattern, decreased stride length, decreased hip/knee flexion- Right, decreased hip/knee flexion- Left, decreased ankle dorsiflexion- Right, decreased ankle dorsiflexion- Left, Right foot flat, Left foot flat, shuffling, wide BOS, poor foot clearance- Right, and poor foot clearance- Left Distance walked: 2 x 40' and in and out of clinic, pt fatigued after approx. 33'  Assistive device utilized: Environmental consultant - 2 wheeled and None Level of assistance: CGA and Min A Comments: With pt ambulating in and out of clinic, pt needing CGA/min A for balance/safety due to pt not having an AD.  Educated on use of RW for safety and to decr fall risk. Utilized one in clinic today with pt needing supervision.   FUNCTIONAL TESTS:  5 times sit to stand: 53.62 seconds with BUE support, unable to come fully to stand during last 2 reps, pt very fatigued afterwards   10 meter walk test: 33.34 seconds with RW = .98 ft/sec    TODAY'S TREATMENT:                                                                                                                               N/A during eval.  PATIENT EDUCATION: Education details: Clinical findings, POC, importance of use of RW to decr fall risk, OT referral due to decr BUE strength, coordination, and fine motor tasks. Pt seeing his PCP after PT today (PT wrote note for pt to give to PCP requesting order for RW and OT as they are not in the [facility]). Person educated: Patient Education method: Explanation Education comprehension: verbalized understanding  HOME EXERCISE PROGRAM: Will provide at next session.   GOALS: Goals reviewed with patient? Yes  SHORT TERM GOALS: Target date: 06/27/2022  Pt will be independent with initial HEP in order to build upon functional gains made in therapy. Baseline: Goal status: INITIAL  2.  BERG to be assessed with STG/LTG written.  Baseline:  Goal status: INITIAL  3.  Pt will verbalize understanding of fall prevention in the home.  Baseline:  Goal status: INITIAL  4.  Pt will improve gait speed with RW vs. LRAD to at least 1.3 ft/sec in order to demo decr fall risk.  Baseline:  33.34 seconds with RW = .98 ft/sec  Goal status: INITIAL  5.  Pt will improve 5x sit<>stand to less than or equal to 44 sec with BUE support to demonstrate improved functional strength and transfer efficiency.   Baseline:  53.62 seconds with BUE support, unable to come fully to stand during last 2 reps, pt very fatigued afterwards   Goal status: INITIAL    LONG TERM GOALS: Target date: 07/25/2022  Pt will be independent with final HEP in order to build upon functional gains made in therapy. Baseline:  Goal status: INITIAL  2.  BERG goal to be written.  Baseline:  Goal status: INITIAL  3.  Pt will go up and down 6  steps with step to pattern and single handrail with mod I in order to safely get in and out of his home.  Baseline: pt reports having a fall down his steps.  Goal status:  INITIAL  4.   Pt will improve 5x sit<>stand to less than or equal to 30 sec with BUE support to demonstrate improved functional strength and transfer efficiency.  Baseline: 53.62 seconds with BUE support, unable to come fully to stand during last 2 reps, pt very fatigued afterwards   Goal status: INITIAL  5.    Pt will improve gait speed with RW vs. LRAD to at least 2.0 ft/sec in order to demo improved community mobility.  Baseline: 33.34 seconds with RW = .98 ft/sec  Goal status: INITIAL    ASSESSMENT:  CLINICAL IMPRESSION: Patient is a 57 year old male referred to Neuro OPPT for weakness, s/p hospitalization after hyperglycemia.  Pt has had multiple falls in the past few months and is now using a rollator to ambulate (did ambulate in with no AD today, but used clinic RW for safety). Pt's PMH is significant for: HTN, diabetes mellitus type 2 who is noncompliant with medication, HTN, HLD, Acute Kidney Injury, Hypokalemia, bipolar disorder, rheumatoid arthritis, diabetic peripheral neuropathy, hx of TIAs, tobacco abuse, hx of CVA (pt reports it affected his L side). The following deficits were present during the exam: impaired balance, gait abnormalities, decr activity tolerance, impaired sensation, postural abnormalities, decr strength, impaired coordination. Based on gait speed and 5x sit <> stand, pt is an incr risk for falls. Pt would benefit from skilled PT to address these impairments and functional limitations to maximize functional mobility independence and decr fall risk.    OBJECTIVE IMPAIRMENTS: Abnormal gait, decreased activity tolerance, decreased balance, decreased coordination, decreased endurance, decreased knowledge of condition, decreased knowledge of use of DME, decreased mobility, difficulty walking, decreased ROM, decreased strength, decreased safety awareness, impaired flexibility, impaired sensation, impaired UE functional use, postural dysfunction, and pain.   ACTIVITY  LIMITATIONS: carrying, lifting, standing, stairs, transfers, bathing, dressing, self feeding, hygiene/grooming, and locomotion level  PARTICIPATION LIMITATIONS: meal prep, cleaning, laundry, shopping, and community activity  PERSONAL FACTORS: Age, Behavior pattern, Past/current experiences, Time since onset of injury/illness/exacerbation, and 3+ comorbidities: HTN, diabetes mellitus type 2 who is noncompliant with medication, HTN, HLD, Acute Kidney Injury, Hypokalemia, bipolar disorder, rheumatoid arthritis, diabetic peripheral neuropathy, hx of TIAs, tobacco abuse, hx of CVA (pt reports it affected his L side)  are also affecting patient's functional outcome.   REHAB POTENTIAL: Good  CLINICAL DECISION MAKING: Evolving/moderate complexity  EVALUATION COMPLEXITY: Moderate  PLAN:  PT FREQUENCY: 2x/week  PT DURATION: 8 weeks  PLANNED INTERVENTIONS: Therapeutic exercises, Therapeutic activity, Neuromuscular re-education, Balance training, Gait training, Patient/Family education, Self Care, Stair training, Vestibular training, Visual/preceptual remediation/compensation, Orthotic/Fit training, DME instructions, Manual therapy, and Re-evaluation  PLAN FOR NEXT SESSION: Assess BERG and write goal. Did referrals come back for OT and RW? Initial HEP for strength, balance as able.    Arliss Journey, PT, DPT  05/30/2022, 1:09 PM

## 2022-06-05 ENCOUNTER — Ambulatory Visit: Payer: Medicare HMO | Admitting: Physical Therapy

## 2022-06-05 ENCOUNTER — Encounter: Payer: Self-pay | Admitting: Physical Therapy

## 2022-06-05 VITALS — BP 165/76 | HR 97

## 2022-06-05 DIAGNOSIS — R2689 Other abnormalities of gait and mobility: Secondary | ICD-10-CM

## 2022-06-05 DIAGNOSIS — R2681 Unsteadiness on feet: Secondary | ICD-10-CM

## 2022-06-05 DIAGNOSIS — Z9181 History of falling: Secondary | ICD-10-CM

## 2022-06-05 DIAGNOSIS — M6281 Muscle weakness (generalized): Secondary | ICD-10-CM

## 2022-06-05 DIAGNOSIS — R208 Other disturbances of skin sensation: Secondary | ICD-10-CM

## 2022-06-05 NOTE — Therapy (Signed)
OUTPATIENT PHYSICAL THERAPY NEURO TREATMENT   Patient Name: Vincent [name] MRN: [id number] DOB:October 04, 1965, 57 y.o., male Today's Date: 06/05/2022   PCP: Hayden Rasmussen, MD REFERRING PROVIDER: Debbe Odea, MD   END OF SESSION:  PT End of Session - 06/05/22 1108     Visit Number 2    Number of Visits 17    Date for PT Re-Evaluation 07/29/22    Authorization Type [employer]    PT Start Time 1102    PT Stop Time 1145    PT Time Calculation (min) 43 min    Equipment Utilized During Treatment Gait belt    Activity Tolerance Patient tolerated treatment well    Behavior During Therapy WFL for tasks assessed/performed             Past Medical History:  Diagnosis Date   Anxiety    Benign essential tremor    Bipolar 2 disorder ([place])    followed by [facility]--- dr s. Adele Schilder   Bladder cancer [facility])    recurrent   CAD (coronary artery disease)    cardiac cath 2003  and 2011 both showed normal coronary arteries w/ preserved lvf;  Non obstructive on CTA Oct 2019.    Chronic pain syndrome    back---- [facility] [place]   Cold extremities    BLE   COPD (chronic obstructive pulmonary disease) ([place])    DDD (degenerative disc disease), lumbar    Diabetic peripheral neuropathy ([place])    Gastroparesis    followed by dr Henrene Pastor   GERD (gastroesophageal reflux disease)    Hiatal hernia    History of bladder cancer urologist-  previously dr Consuella Lose;  now dr gay   papillay TCC (Ta G1)  s/p TURBT and chemo instillation 2014   History of chest pain 12/2017   heart cath normal   History of encephalopathy 05/27/2015   admission w/ acute encephalopathy thought to be secondary to pain meds and COPD   History of gastric ulcer    History of Helicobacter pylori infection    History of kidney stones    History of TIA (transient ischemic attack) 2008  and 10-19-2018    no residual's   History of traumatic head injury 2010   w/ LOC  per pt needed stitches, hit in head with  a mower blade   Hyperlipidemia    Hypertension    Hypogonadism male    s/p  bilateral orchiectomy   Hypothyroidism    Insomnia    Mild obstructive sleep apnea    study in epic 12-04-2016, no cpap   PTSD (post-traumatic stress disorder)    chronic   PTSD (post-traumatic stress disorder)    RA (rheumatoid arthritis) ([place])    followed by [facility]   Seizures, transient [facility]) neurologist-  dr Krista Blue--  differential dx complex partial seizure .vs.  mood disorder .vs.  pseudoseizure--  negative EEG's   confusion episodes and staring spells since 11/ 2015   (03-26-2020 per pt wife last seizure 10 /2021)   Transient confusion NEUOROLOGIST-  DR Krista Blue   Episodes since 11/ 2015--  neurologist dx  differential complex partial seizure  .vs. mood disorder . vs. pseudoseizure   Type 2 diabetes mellitus treated with insulin [facility])    endocrinologist--- dr Loanne Drilling---  (03-26-2020 pt does not check blood sugar at home)   Past Surgical History:  Procedure Laterality Date   AMPUTATION Left 04/28/2020   Procedure: LEFT LITTLE FINGER AMPUTATION;  Surgeon:  Newt Minion, MD;  Location: [place];  Service: Orthopedics;  Laterality: Left;   CARDIAC CATHETERIZATION  12-27-2001  DR Einar Gip  &  05-26-2009  DR Irish Lack   RESULTS FOR BOTH ARE NORMAL CORONARIES AND PERSERVED LVF/ EF 60%   CARPAL TUNNEL RELEASE Bilateral right 09-16-2003;  left ?   CARPAL TUNNEL RELEASE Left 02/25/2015   Procedure: LEFT CARPAL TUNNEL RELEASE;  Surgeon: Leanora Cover, MD;  Location: [place];  Service: Orthopedics;  Laterality: Left;   CYSTOSCOPY N/A 10/10/2012   Procedure: CYSTOSCOPY CLOT EVACUATION FULGERATION OF BLEEDERS ;  Surgeon: Claybon Jabs, MD;  Location: [facility];  Service: Urology;  Laterality: N/A;   CYSTOSCOPY WITH BIOPSY N/A 11/26/2015   Procedure: CYSTOSCOPY WITH BIOPSY AND FULGURATION;  Surgeon: Kathie Rhodes, MD;  Location: [place];  Service: Urology;   Laterality: N/A;   ESOPHAGOGASTRODUODENOSCOPY  2014   LAPAROSCOPIC CHOLECYSTECTOMY  11-17-2010   ORCHIECTOMY Right 02/21/2016   Procedure: SCROTAL ORCHIECTOMY with TESTICULAR PROSTHESIS IMPLANT;  Surgeon: Kathie Rhodes, MD;  Location: [facility];  Service: Urology;  Laterality: Right;   ORCHIECTOMY Left 09/02/2018   Procedure: ORCHIECTOMY;  Surgeon: Kathie Rhodes, MD;  Location: [facility];  Service: Urology;  Laterality: Left;   ROTATOR CUFF REPAIR Right 12/2004   TRANSURETHRAL RESECTION OF BLADDER TUMOR N/A 08/09/2012   Procedure: TRANSURETHRAL RESECTION OF BLADDER TUMOR (TURBT) WITH GYRUS WITH MITOMYCIN C;  Surgeon: Claybon Jabs, MD;  Location: [facility];  Service: Urology;  Laterality: N/A;   TRANSURETHRAL RESECTION OF BLADDER TUMOR N/A 03/29/2020   Procedure: TRANSURETHRAL RESECTION OF BLADDER TUMOR (TURBT) and post-op instillation of gemcitabine;  Surgeon: Janith Lima, MD;  Location: [facility];  Service: Urology;  Laterality: N/A;   TRANSURETHRAL RESECTION OF BLADDER TUMOR WITH GYRUS (TURBT-GYRUS) N/A 02/27/2014   Procedure: TRANSURETHRAL RESECTION OF BLADDER TUMOR WITH GYRUS (TURBT-GYRUS);  Surgeon: Claybon Jabs, MD;  Location: [facility];  Service: Urology;  Laterality: N/A;   Patient Active Problem List   Diagnosis Date Noted   Hyperosmolar hyperglycemic state (HHS) ([place]) 05/24/2022   Essential hypertension 05/23/2022   BPH (benign prostatic hyperplasia) 05/23/2022   Diabetic hyperosmolar non-ketotic state ([place]) 04/05/2022   AKI (acute kidney injury) ([place]) 09/04/2021   Hypotension 09/03/2021   History of seizure [id number]   Acute metabolic encephalopathy Q000111Q   Suicide attempt by drug overdose ([place]) 07/28/2021   Cerebral thrombosis with cerebral infarction 11/16/2020   Stroke-like symptoms 11/14/2020   Hyperkalemia 11/14/2020   Ischemic stroke ([place]) 06/10/2020   Hyperglycemia     Gangrene of finger of left hand (HCC)    Pain due to onychomycosis of toenails of both feet 01/28/2020   Hyperglycemia due to type 2 diabetes mellitus ([place]) 01/07/2020   Resistance to insulin 01/07/2020   Right leg weakness 04/24/2019   Right leg pain 04/24/2019   Gait abnormality 04/24/2019   TIA (transient ischemic attack) 10/20/2018   DM2 (diabetes mellitus, type 2) ([place]) 10/19/2018   Left sided numbness 10/19/2018   Diabetic autonomic neuropathy associated with type 2 diabetes mellitus ([place]) 02/01/2018   DM type 2 with diabetic peripheral neuropathy ([place]) 02/01/2018   Dyslipidemia 01/31/2018   Coronary artery disease involving native coronary artery of native heart without angina pectoris 01/31/2018   Chest pain 01/01/2018   Breakthrough seizure ([place]) 12/19/2016   Essential tremor 12/19/2016   Chronic post-traumatic stress disorder (PTSD) 06/16/2016   Tremor 06/16/2016  Tobacco abuse 05/05/2016   Altered mental status 05/28/2015   Acute bronchitis 05/28/2015   Type 2 diabetes mellitus with hyperglycemia ([place]) 05/28/2015   Hypothyroidism 05/28/2015   Bipolar 2 disorder ([place]) 05/28/2015   History of rheumatoid arthritis 05/28/2015   Chronic pain 05/28/2015   Bipolar II disorder, most recent episode major depressive ([place]) 03/18/2015   Carpal tunnel syndrome on left 02/10/2015   Carpal tunnel syndrome on right 02/10/2015   Diabetes ([place]) 01/23/2015   Obesity (BMI 30-39.9) 03/06/2014   Acute encephalopathy 03/05/2014   Hyperlipidemia 03/05/2014   Anemia, normocytic normochromic 03/05/2014   Altered mental state    Helicobacter pylori (H. pylori) infection 11/20/2012   Protein-calorie malnutrition, severe ([place]) 10/09/2012   Dysphagia 08/19/2012   Gastroparesis 08/19/2012   Bladder tumor 08/08/2012   Biliary dyskinesia 11/02/2010    ONSET DATE: 05/26/2022  REFERRING DIAG: R73.9 (ICD-10-CM) - Hyperglycemia R29.898 (ICD-10-CM) - Right leg weakness R20.0 (ICD-10-CM) - Left  sided numbness  THERAPY DIAG:  Unsteadiness on feet  Other abnormalities of gait and mobility  History of falling  Muscle weakness (generalized)  Other disturbances of skin sensation  Rationale for Evaluation and Treatment: Rehabilitation  SUBJECTIVE:                                                                                                                                                                                             SUBJECTIVE STATEMENT: Patient reports one fall since last visit. He was going up the steps and felt like his L leg gave out and hit the stairs. Denies hitting head but reports rib soreness. Has not gone in for assessment and state he doesn't think he needs it as "I didn't break anything." Still no updates on OT eval or walker and haven't seen any notes in chart. Patient primary concern continues to be hands. States he only feels like he can walk into clinic from parking lot and feels like beyond there his legs will give out. Blood glucose was 137 this morning before coming to PT.   Pt accompanied by: self  PERTINENT HISTORY: PMH: HTN, diabetes mellitus type 2 who is noncompliant with medication, HTN, HLD, Acute Kidney Injury, Hypokalemia, bipolar disorder, rheumatoid arthritis, diabetic peripheral neuropathy, hx of TIAs, tobacco abuse, hx of CVA (pt reports it affected his L side)  Per Dr. Naaman Plummer note in hospital: Pt with poorly controlled diabetes who presented on 05/23/22 weakness, malaise, and diarrhea. He was found to have a CBG (Capillary blood glucose) of 1184 and a Na+ of 121. Hgb A1C was 14.1! He was placed on IVF fluids and insulin and sugars are better  controlled but sill remain in the 300's. Pt c/o numbness in hands and distal lower extremities as well as ongoing weakness, left > right. The weakness is not all entirely new as he has been experiencing symptoms for several months apparently. Prior to arrival he ambulating with a walker and could perform  ADL's   PAIN:  Are you having pain? Yes: NPRS scale: 4/10 for rib pain and neuropathic pain 7-8/10 Pain location: Lower legs, arms and back, rib Pain description: Constant, sharp  Aggravating factors: Nothing, stays the same unless getting exerted  Relieving factors: Not really   Vitals:   06/05/22 1115  BP: (!) 165/76  Pulse: 97  SpO2: 99%    PRECAUTIONS: Fall  WEIGHT BEARING RESTRICTIONS: No  FALLS: Has patient fallen in last 6 months? Yes. Number of falls 8, pt reports he has banged himself pretty good with these   LIVING ENVIRONMENT: Lives with:  girlfriend lives there 3x a week  Lives in: Mobile home Stairs: Yes: External: 6 steps; bilateral but cannot reach both Has following equipment at home: Single point cane, Walker - 2 wheeled, Environmental consultant - 4 wheeled, and shower chair  PLOF: Independent, Vocation/Vocational requirements: On disability (has been for years), and Leisure: going to [employer] races  pt reports he is still driving, Pt's girlfriend helps pt get dressed, take a shower, cooking him dinner, chores   PATIENT GOALS: Wants to get legs stronger, get better at feeding himself and feeling in hands   OBJECTIVE:   DIAGNOSTIC FINDINGS: CT head 05/23/22: IMPRESSION: No acute intracranial findings.  No CT evidence of acute infarction.    COGNITION: Overall cognitive status: Within functional limits for tasks assessed   TODAY'S TREATMENT:                                                                                                                                [facility] PT Assessment - 06/05/22 0001       Standardized Balance Assessment   Standardized Balance Assessment Berg Balance Test      Berg Balance Test   Sit to Stand Able to stand using hands after several tries    Standing Unsupported Unable to stand 30 seconds unassisted    Sitting with Back Unsupported but Feet Supported on Floor or Stool Able to sit safely and securely 2 minutes    Stand to Sit Uses backs  of legs against chair to control descent    Transfers Able to transfer with verbal cueing and /or supervision    Standing Unsupported with Eyes Closed Needs help to keep from falling    Standing Unsupported with Feet Together Needs help to attain position and unable to hold for 15 seconds    From Standing, Reach Forward with Outstretched Arm Loses balance while trying/requires external support    From Standing Position, Pick up Object from Floor Unable to try/needs assist to keep balance    From Standing Position, Turn  to Look Behind Over each Shoulder Needs supervision when turning    Turn 360 Degrees Needs assistance while turning    Standing Unsupported, Alternately Place Feet on Step/Stool Needs assistance to keep from falling or unable to try    Standing Unsupported, One Foot in [place] balance while stepping or standing    Standing on One Leg Unable to try or needs assist to prevent fall    Total Score 11    Berg comment: very unstead with all static standing balance; requires mod A to keep from falling             TherEx with emphasis on initial HEP: - Supine Bridge  - 2 sets - 10 reps - Clamshell  - 2 sets - 10-20 reps - Supine March - 2 sets - 20 reps  Fatigues quickly and requires recovery breaks.   PATIENT EDUCATION: Education details: Berg results/interpretation, follow up with PCP about referrals, and results Person educated: Patient Education method: Explanation Education comprehension: verbalized understanding  HOME EXERCISE PROGRAM: Access Code: [email or web address] URL: [email or web address] Date: 06/05/2022 Prepared by: Malachi Carl  Exercises - Supine Bridge  - 1 x daily - 7 x weekly - 2 sets - 10 reps - Clamshell  - 1 x daily - 7 x weekly - 2 sets - 10-20 reps - Supine March  - 1 x daily - 7 x weekly - 2 sets - 20 reps  GOALS: Goals reviewed with patient? Yes  SHORT TERM GOALS: Target date: 06/27/2022  Pt will be independent with initial HEP in  order to build upon functional gains made in therapy. Baseline: Goal status: INITIAL  2.  BERG to be assessed with STG/LTG written.  Baseline:  Goal status: INITIAL  3.  Pt will verbalize understanding of fall prevention in the home.  Baseline:  Goal status: INITIAL  4.  Pt will improve gait speed with RW vs. LRAD to at least 1.3 ft/sec in order to demo decr fall risk.  Baseline:  33.34 seconds with RW = .98 ft/sec  Goal status: INITIAL  5.  Pt will improve 5x sit<>stand to less than or equal to 44 sec with BUE support to demonstrate improved functional strength and transfer efficiency.   Baseline:  53.62 seconds with BUE support, unable to come fully to stand during last 2 reps, pt very fatigued afterwards   Goal status: INITIAL    LONG TERM GOALS: Target date: 07/25/2022  Pt will be independent with final HEP in order to build upon functional gains made in therapy. Baseline:  Goal status: INITIAL  2.  BERG goal to be written.  Baseline:  Goal status: INITIAL  3.  Pt will go up and down 6 steps with step to pattern and single handrail with mod I in order to safely get in and out of his home.  Baseline: pt reports having a fall down his steps.  Goal status: INITIAL  4.   Pt will improve 5x sit<>stand to less than or equal to 30 sec with BUE support to demonstrate improved functional strength and transfer efficiency.  Baseline: 53.62 seconds with BUE support, unable to come fully to stand during last 2 reps, pt very fatigued afterwards   Goal status: INITIAL  5.    Pt will improve gait speed with RW vs. LRAD to at least 2.0 ft/sec in order to demo improved community mobility.  Baseline: 33.34 seconds with RW = .98 ft/sec  Goal status: INITIAL    ASSESSMENT:  CLINICAL IMPRESSION: Session emphasized assessment of Berg balance test and creation of initial HEP with emphasis on proximal stability. Patient scores 11/56 on Berg balance test indicating very high risk for falls  and nearly need for wheel chair ambulation. Patient dynamic balance is better than static balance likely due to neuropathic impairments and strong reliance on momentum. Tolerated HEP well but unable to achieve full bridging position; required breaks for fatigue. Pt would benefit from skilled PT to address these impairments and functional limitations to maximize functional mobility independence and decr fall risk.    OBJECTIVE IMPAIRMENTS: Abnormal gait, decreased activity tolerance, decreased balance, decreased coordination, decreased endurance, decreased knowledge of condition, decreased knowledge of use of DME, decreased mobility, difficulty walking, decreased ROM, decreased strength, decreased safety awareness, impaired flexibility, impaired sensation, impaired UE functional use, postural dysfunction, and pain.   ACTIVITY LIMITATIONS: carrying, lifting, standing, stairs, transfers, bathing, dressing, self feeding, hygiene/grooming, and locomotion level  PARTICIPATION LIMITATIONS: meal prep, cleaning, laundry, shopping, and community activity  PERSONAL FACTORS: Age, Behavior pattern, Past/current experiences, Time since onset of injury/illness/exacerbation, and 3+ comorbidities: HTN, diabetes mellitus type 2 who is noncompliant with medication, HTN, HLD, Acute Kidney Injury, Hypokalemia, bipolar disorder, rheumatoid arthritis, diabetic peripheral neuropathy, hx of TIAs, tobacco abuse, hx of CVA (pt reports it affected his L side)  are also affecting patient's functional outcome.   REHAB POTENTIAL: Good  CLINICAL DECISION MAKING: Evolving/moderate complexity  EVALUATION COMPLEXITY: Moderate  PLAN:  PT FREQUENCY: 2x/week  PT DURATION: 8 weeks  PLANNED INTERVENTIONS: Therapeutic exercises, Therapeutic activity, Neuromuscular re-education, Balance training, Gait training, Patient/Family education, Self Care, Stair training, Vestibular training, Visual/preceptual remediation/compensation,  Orthotic/Fit training, DME instructions, Manual therapy, and Re-evaluation  PLAN FOR NEXT SESSION: Did referrals come back for OT and RW? Initial HEP for strength, balance as able, proximal stability/dynamic stepping reaction (tolerate tall kneeling or quadruped for strengthening?)  Esperanza Heir, PT, DPT  06/05/2022, 12:13 PM

## 2022-06-08 ENCOUNTER — Ambulatory Visit: Payer: Medicare HMO | Admitting: Physical Therapy

## 2022-06-08 ENCOUNTER — Encounter: Payer: Self-pay | Admitting: Physical Therapy

## 2022-06-08 DIAGNOSIS — M6281 Muscle weakness (generalized): Secondary | ICD-10-CM

## 2022-06-08 DIAGNOSIS — R2681 Unsteadiness on feet: Secondary | ICD-10-CM | POA: Diagnosis not present

## 2022-06-08 DIAGNOSIS — Z9181 History of falling: Secondary | ICD-10-CM

## 2022-06-08 DIAGNOSIS — R2689 Other abnormalities of gait and mobility: Secondary | ICD-10-CM

## 2022-06-08 DIAGNOSIS — G629 Polyneuropathy, unspecified: Secondary | ICD-10-CM

## 2022-06-08 NOTE — Therapy (Signed)
OUTPATIENT PHYSICAL THERAPY NEURO TREATMENT   Patient Name: Vincent [name] MRN: [id number] DOB:11/28/1965, 57 y.o., male Today's Date: 06/08/2022   PCP: Hayden Rasmussen, MD REFERRING PROVIDER: Debbe Odea, MD   END OF SESSION:  PT End of Session - 06/08/22 1106     Visit Number 3    Number of Visits 17    Date for PT Re-Evaluation 07/29/22    Authorization Type [employer]    PT Start Time 1104    PT Stop Time 1144    PT Time Calculation (min) 40 min    Equipment Utilized During Treatment Gait belt    Activity Tolerance Patient tolerated treatment well    Behavior During Therapy [facility] for tasks assessed/performed             Past Medical History:  Diagnosis Date   Anxiety    Benign essential tremor    Bipolar 2 disorder ([place])    followed by [facility]--- dr s. Adele Schilder   Bladder cancer [facility])    recurrent   CAD (coronary artery disease)    cardiac cath 2003  and 2011 both showed normal coronary arteries w/ preserved lvf;  Non obstructive on CTA Oct 2019.    Chronic pain syndrome    back---- [facility] [place]   Cold extremities    BLE   COPD (chronic obstructive pulmonary disease) ([place])    DDD (degenerative disc disease), lumbar    Diabetic peripheral neuropathy ([place])    Gastroparesis    followed by dr Henrene Pastor   GERD (gastroesophageal reflux disease)    Hiatal hernia    History of bladder cancer urologist-  previously dr Consuella Lose;  now dr gay   papillay TCC (Ta G1)  s/p TURBT and chemo instillation 2014   History of chest pain 12/2017   heart cath normal   History of encephalopathy 05/27/2015   admission w/ acute encephalopathy thought to be secondary to pain meds and COPD   History of gastric ulcer    History of Helicobacter pylori infection    History of kidney stones    History of TIA (transient ischemic attack) 2008  and 10-19-2018    no residual's   History of traumatic head injury 2010   w/ LOC  per pt needed stitches, hit in head with  a mower blade   Hyperlipidemia    Hypertension    Hypogonadism male    s/p  bilateral orchiectomy   Hypothyroidism    Insomnia    Mild obstructive sleep apnea    study in epic 12-04-2016, no cpap   PTSD (post-traumatic stress disorder)    chronic   PTSD (post-traumatic stress disorder)    RA (rheumatoid arthritis) ([place])    followed by [facility]   Seizures, transient [facility]) neurologist-  dr Krista Blue--  differential dx complex partial seizure .vs.  mood disorder .vs.  pseudoseizure--  negative EEG's   confusion episodes and staring spells since 11/ 2015   (03-26-2020 per pt wife last seizure 10 /2021)   Transient confusion NEUOROLOGIST-  DR Krista Blue   Episodes since 11/ 2015--  neurologist dx  differential complex partial seizure  .vs. mood disorder . vs. pseudoseizure   Type 2 diabetes mellitus treated with insulin [facility])    endocrinologist--- dr Loanne Drilling---  (03-26-2020 pt does not check blood sugar at home)   Past Surgical History:  Procedure Laterality Date   AMPUTATION Left 04/28/2020   Procedure: LEFT LITTLE FINGER AMPUTATION;  Surgeon:  Newt Minion, MD;  Location: [place];  Service: Orthopedics;  Laterality: Left;   CARDIAC CATHETERIZATION  12-27-2001  DR Einar Gip  &  05-26-2009  DR Irish Lack   RESULTS FOR BOTH ARE NORMAL CORONARIES AND PERSERVED LVF/ EF 60%   CARPAL TUNNEL RELEASE Bilateral right 09-16-2003;  left ?   CARPAL TUNNEL RELEASE Left 02/25/2015   Procedure: LEFT CARPAL TUNNEL RELEASE;  Surgeon: Leanora Cover, MD;  Location: [place];  Service: Orthopedics;  Laterality: Left;   CYSTOSCOPY N/A 10/10/2012   Procedure: CYSTOSCOPY CLOT EVACUATION FULGERATION OF BLEEDERS ;  Surgeon: Claybon Jabs, MD;  Location: [facility];  Service: Urology;  Laterality: N/A;   CYSTOSCOPY WITH BIOPSY N/A 11/26/2015   Procedure: CYSTOSCOPY WITH BIOPSY AND FULGURATION;  Surgeon: Kathie Rhodes, MD;  Location: [place];  Service: Urology;   Laterality: N/A;   ESOPHAGOGASTRODUODENOSCOPY  2014   LAPAROSCOPIC CHOLECYSTECTOMY  11-17-2010   ORCHIECTOMY Right 02/21/2016   Procedure: SCROTAL ORCHIECTOMY with TESTICULAR PROSTHESIS IMPLANT;  Surgeon: Kathie Rhodes, MD;  Location: [facility];  Service: Urology;  Laterality: Right;   ORCHIECTOMY Left 09/02/2018   Procedure: ORCHIECTOMY;  Surgeon: Kathie Rhodes, MD;  Location: [facility];  Service: Urology;  Laterality: Left;   ROTATOR CUFF REPAIR Right 12/2004   TRANSURETHRAL RESECTION OF BLADDER TUMOR N/A 08/09/2012   Procedure: TRANSURETHRAL RESECTION OF BLADDER TUMOR (TURBT) WITH GYRUS WITH MITOMYCIN C;  Surgeon: Claybon Jabs, MD;  Location: [facility];  Service: Urology;  Laterality: N/A;   TRANSURETHRAL RESECTION OF BLADDER TUMOR N/A 03/29/2020   Procedure: TRANSURETHRAL RESECTION OF BLADDER TUMOR (TURBT) and post-op instillation of gemcitabine;  Surgeon: Janith Lima, MD;  Location: [facility];  Service: Urology;  Laterality: N/A;   TRANSURETHRAL RESECTION OF BLADDER TUMOR WITH GYRUS (TURBT-GYRUS) N/A 02/27/2014   Procedure: TRANSURETHRAL RESECTION OF BLADDER TUMOR WITH GYRUS (TURBT-GYRUS);  Surgeon: Claybon Jabs, MD;  Location: [facility];  Service: Urology;  Laterality: N/A;   Patient Active Problem List   Diagnosis Date Noted   Peripheral neuropathy in hands 06/08/2022   Hyperosmolar hyperglycemic state (HHS) ([place]) 05/24/2022   Essential hypertension 05/23/2022   BPH (benign prostatic hyperplasia) 05/23/2022   Diabetic hyperosmolar non-ketotic state ([place]) 04/05/2022   AKI (acute kidney injury) ([place]) 09/04/2021   Hypotension 09/03/2021   History of seizure [id number]   Acute metabolic encephalopathy Q000111Q   Suicide attempt by drug overdose ([place]) 07/28/2021   Cerebral thrombosis with cerebral infarction 11/16/2020   Stroke-like symptoms 11/14/2020   Hyperkalemia 11/14/2020   Ischemic stroke  ([place]) 06/10/2020   Hyperglycemia    Gangrene of finger of left hand ([facility])    Pain due to onychomycosis of toenails of both feet 01/28/2020   Hyperglycemia due to type 2 diabetes mellitus ([place]) 01/07/2020   Resistance to insulin 01/07/2020   Right leg weakness 04/24/2019   Right leg pain 04/24/2019   Gait abnormality 04/24/2019   TIA (transient ischemic attack) 10/20/2018   DM2 (diabetes mellitus, type 2) ([place]) 10/19/2018   Left sided numbness 10/19/2018   Diabetic autonomic neuropathy associated with type 2 diabetes mellitus ([place]) 02/01/2018   DM type 2 with diabetic peripheral neuropathy ([place]) 02/01/2018   Dyslipidemia 01/31/2018   Coronary artery disease involving native coronary artery of native heart without angina pectoris 01/31/2018   Chest pain 01/01/2018   Breakthrough seizure ([place]) 12/19/2016   Essential tremor 12/19/2016   Chronic post-traumatic stress disorder (  PTSD) 06/16/2016   Tremor 06/16/2016   Tobacco abuse 05/05/2016   Altered mental status 05/28/2015   Acute bronchitis 05/28/2015   Type 2 diabetes mellitus with hyperglycemia ([place]) 05/28/2015   Hypothyroidism 05/28/2015   Bipolar 2 disorder ([place]) 05/28/2015   History of rheumatoid arthritis 05/28/2015   Chronic pain 05/28/2015   Bipolar II disorder, most recent episode major depressive ([place]) 03/18/2015   Carpal tunnel syndrome on left 02/10/2015   Carpal tunnel syndrome on right 02/10/2015   Diabetes ([place]) 01/23/2015   Obesity (BMI 30-39.9) 03/06/2014   Acute encephalopathy 03/05/2014   Hyperlipidemia 03/05/2014   Anemia, normocytic normochromic 03/05/2014   Altered mental state    Helicobacter pylori (H. pylori) infection 11/20/2012   Protein-calorie malnutrition, severe ([place]) 10/09/2012   Dysphagia 08/19/2012   Gastroparesis 08/19/2012   Bladder tumor 08/08/2012   Biliary dyskinesia 11/02/2010    ONSET DATE: 05/26/2022  REFERRING DIAG: R73.9 (ICD-10-CM) - Hyperglycemia R29.898 (ICD-10-CM) - Right  leg weakness R20.0 (ICD-10-CM) - Left sided numbness  THERAPY DIAG:  Unsteadiness on feet  Other abnormalities of gait and mobility  Muscle weakness (generalized)  History of falling  Rationale for Evaluation and Treatment: Rehabilitation  SUBJECTIVE:                                                                                                                                                                                             SUBJECTIVE STATEMENT: Reports that his legs were feeling sore from the exercises. No falls. Forgot his cane today. Reports blood sugars have been doing well  Pt accompanied by: self  PERTINENT HISTORY: PMH: HTN, diabetes mellitus type 2 who is noncompliant with medication, HTN, HLD, Acute Kidney Injury, Hypokalemia, bipolar disorder, rheumatoid arthritis, diabetic peripheral neuropathy, hx of TIAs, tobacco abuse, hx of CVA (pt reports it affected his L side)  Per Dr. Naaman Plummer note in hospital: Pt with poorly controlled diabetes who presented on 05/23/22 weakness, malaise, and diarrhea. He was found to have a CBG (Capillary blood glucose) of 1184 and a Na+ of 121. Hgb A1C was 14.1! He was placed on IVF fluids and insulin and sugars are better controlled but sill remain in the 300's. Pt c/o numbness in hands and distal lower extremities as well as ongoing weakness, left > right. The weakness is not all entirely new as he has been experiencing symptoms for several months apparently. Prior to arrival he ambulating with a walker and could perform ADL's   PAIN:  Are you having pain? Yes: NPRS scale: 5 (neuropathy pain)/10 Pain location: Lower legs, arms and back, rib Pain description: Constant, sharp  Aggravating factors:  Nothing, stays the same unless getting exerted  Relieving factors: Not really   There were no vitals filed for this visit.   PRECAUTIONS: Fall  WEIGHT BEARING RESTRICTIONS: No  FALLS: Has patient fallen in last 6 months? Yes. Number of  falls 8, pt reports he has banged himself pretty good with these   LIVING ENVIRONMENT: Lives with:  girlfriend lives there 3x a week  Lives in: Mobile home Stairs: Yes: External: 6 steps; bilateral but cannot reach both Has following equipment at home: Single point cane, Walker - 2 wheeled, Environmental consultant - 4 wheeled, and shower chair  PLOF: Independent, Vocation/Vocational requirements: On disability (has been for years), and Leisure: going to [employer] races  pt reports he is still driving, Pt's girlfriend helps pt get dressed, take a shower, cooking him dinner, chores   PATIENT GOALS: Wants to get legs stronger, get better at feeding himself and feeling in hands   OBJECTIVE:   DIAGNOSTIC FINDINGS: CT head 05/23/22: IMPRESSION: No acute intracranial findings.  No CT evidence of acute infarction.    COGNITION: Overall cognitive status: Within functional limits for tasks assessed   TODAY'S TREATMENT:                                                                                                                               TherEx Seated LAQs with yellow t-band x10 reps bilat, progressed to red tband x10 reps bilat, cues for 3 second isometric hold  Seated hip ADD ball squeezes with 3 second hold 2 x 10 reps  Seated hip flexion/ABD lateral stepping over 4" obstacle x10 reps each side, pt reporting feeling burning in LLE   NMR Sit <> stands from mat table with immediate standing balance without UE support, pt performs with wide BOS and needs min guard/min A in standing for balance, and focus on slow controlled back to mat table. Performed 2 sets of 5 reps, needing a rest break between each set.  Static standing balance on level ground with wide BOS; 1 bout of 1 minute, 2nd bout of 1 minute 15 seconds, pt's BLE fatigued after a minute and needing a seated rest break in between. Pt losing balance posteriorly against mat table and needing to sit down.  Brought RW out to waiting room (as pt had no AD)  for pt to ambulate back to therapy gym safely with supervision, also pt ambulated with RW to check out desk to get scheduled for OT at end of session.   PATIENT EDUCATION: Education details: Addition of seated LAQs with red tband to HEP, getting scheduled for OT, asking PCP about 2 wheeled RW order (never got an order for this yet), pt reports seeing his PCP later today.  Person educated: Patient Education method: Explanation, Demonstration, and Handouts Education comprehension: verbalized understanding and returned demonstration  HOME EXERCISE PROGRAM: Access Code: [email or web address] URL: [email or web address] Date: 06/08/2022 Prepared by: Janann August  Exercises - Supine Bridge  - 1  x daily - 7 x weekly - 2 sets - 10 reps - Clamshell  - 1 x daily - 7 x weekly - 2 sets - 10-20 reps - Supine March  - 1 x daily - 7 x weekly - 2 sets - 20 reps - Sitting Knee Extension with Resistance  - 1 x daily - 7 x weekly - 2 sets - 10 reps  GOALS: Goals reviewed with patient? Yes  SHORT TERM GOALS: Target date: 06/27/2022  Pt will be independent with initial HEP in order to build upon functional gains made in therapy. Baseline: Goal status: INITIAL  2.  Pt will improve BERG to at least a 18/56 in order to demo decr fall risk.  Baseline: 11/56 Goal status: INITIAL  3.  Pt will verbalize understanding of fall prevention in the home.  Baseline:  Goal status: INITIAL  4.  Pt will improve gait speed with RW vs. LRAD to at least 1.3 ft/sec in order to demo decr fall risk.  Baseline:  33.34 seconds with RW = .98 ft/sec  Goal status: INITIAL  5.  Pt will improve 5x sit<>stand to less than or equal to 44 sec with BUE support to demonstrate improved functional strength and transfer efficiency.   Baseline:  53.62 seconds with BUE support, unable to come fully to stand during last 2 reps, pt very fatigued afterwards   Goal status: INITIAL    LONG TERM GOALS: Target date: 07/25/2022  Pt  will be independent with final HEP in order to build upon functional gains made in therapy. Baseline:  Goal status: INITIAL  2.  Pt will improve BERG to at least a 28/56 in order to demo decr fall risk. Baseline: 11/56 Goal status: INITIAL  3.  Pt will go up and down 6 steps with step to pattern and single handrail with mod I in order to safely get in and out of his home.  Baseline: pt reports having a fall down his steps.  Goal status: INITIAL  4.   Pt will improve 5x sit<>stand to less than or equal to 30 sec with BUE support to demonstrate improved functional strength and transfer efficiency.  Baseline: 53.62 seconds with BUE support, unable to come fully to stand during last 2 reps, pt very fatigued afterwards   Goal status: INITIAL  5.    Pt will improve gait speed with RW vs. LRAD to at least 2.0 ft/sec in order to demo improved community mobility.  Baseline: 33.34 seconds with RW = .98 ft/sec  Goal status: INITIAL    ASSESSMENT:  CLINICAL IMPRESSION: Pt did not bring any AD to session today, so brought in RW to waiting room for pt to ambulate in and out with safely with supervision. Pt going to see PCP later today and is going to request an order for one (this was written down by PT). Remainder of session focused on standing tolerance, BLE strengthening. Pt only able to tolerate standing for approx. 60-75 seconds before losing his balance and needing to sit down to rest. Frequent rest breaks taken due to fatigue. Will continue to progress towards LTGs.   OBJECTIVE IMPAIRMENTS: Abnormal gait, decreased activity tolerance, decreased balance, decreased coordination, decreased endurance, decreased knowledge of condition, decreased knowledge of use of DME, decreased mobility, difficulty walking, decreased ROM, decreased strength, decreased safety awareness, impaired flexibility, impaired sensation, impaired UE functional use, postural dysfunction, and pain.   ACTIVITY LIMITATIONS:  carrying, lifting, standing, stairs, transfers, bathing, dressing, self feeding, hygiene/grooming, and  locomotion level  PARTICIPATION LIMITATIONS: meal prep, cleaning, laundry, shopping, and community activity  PERSONAL FACTORS: Age, Behavior pattern, Past/current experiences, Time since onset of injury/illness/exacerbation, and 3+ comorbidities: HTN, diabetes mellitus type 2 who is noncompliant with medication, HTN, HLD, Acute Kidney Injury, Hypokalemia, bipolar disorder, rheumatoid arthritis, diabetic peripheral neuropathy, hx of TIAs, tobacco abuse, hx of CVA (pt reports it affected his L side)  are also affecting patient's functional outcome.   REHAB POTENTIAL: Good  CLINICAL DECISION MAKING: Evolving/moderate complexity  EVALUATION COMPLEXITY: Moderate  PLAN:  PT FREQUENCY: 2x/week  PT DURATION: 8 weeks  PLANNED INTERVENTIONS: Therapeutic exercises, Therapeutic activity, Neuromuscular re-education, Balance training, Gait training, Patient/Family education, Self Care, Stair training, Vestibular training, Visual/preceptual remediation/compensation, Orthotic/Fit training, DME instructions, Manual therapy, and Re-evaluation  PLAN FOR NEXT SESSION: did pt get RW yet? strength, balance as able, proximal stability/dynamic stepping reaction (tolerate tall kneeling or quadruped for strengthening?), standing tolerance.   Arliss Journey, PT, DPT  06/08/2022, 12:37 PM

## 2022-06-08 NOTE — Discharge Summary (Signed)
Physician Discharge Summary  Vincent [name] [id number] DOB: [birth year] DOA: 05/23/2022  PCP: Hayden Rasmussen, MD  Admit date: 05/23/2022 Discharge date: 06/08/2022 Discharging to: home Recommendations for Outpatient Follow-up:  Continue to encourage compliance with medications  Consults:  none Procedures:  none   Discharge Diagnoses:   Principal Problem:   Hyperosmolar hyperglycemic state (HHS) ([place]) Active Problems:   AKI (acute kidney injury) ([place])   Bipolar 2 disorder ([place])   Hyperlipidemia   History of rheumatoid arthritis   Tobacco abuse   DM2 (diabetes mellitus, type 2) ([place])   Hyperglycemia   Essential hypertension   BPH (benign prostatic hyperplasia)   Peripheral neuropathy in hands     Hospital Course:   This is a 57 year old male with diabetes mellitus type 2 who is noncompliant with medication, essential hypertension and hyperlipidemia who presented to the hospital with generalized weakness, diarrhea and feeling lightheaded.  In the ED he was noted to have hyperglycemia and CBG showed a glucose of 1184.  Sodium was 121 and creatinine was 1.37.  Baseline is around 0.9.  He was started on insulin infusion and IV fluids.   Subjective:  He feels like he is able to use his hands better today. No other complaints.    Assessment and Plan: Principal Problem:   Hyperosmolar hyperglycemic state (HHS) with noncompliance with meds - Last A1c on 07/28/2021 was 14.1-repeat A1c is > 15.5 - Have educated him on maintaining compliance   Active Problems:   AKI (acute kidney injury) ([place]) -Creatinine 1.37 has improved to 0.99 which is his baseline - IVF stopped on 3/14   Acute on chronic peripheral neuropathy - With numbness in his hands and difficulty picking up things- he is able to feed himself- we discussed the connection of these symptoms with DM but since the symptoms in his feet are not as severe as his hands I did order an MRI - MRI of his c spine is  unrevealing - Continue Lyrica, which he takes at home, and follow - his symptoms have steadily improved (but not resolved) and he has been feeding himself in the hospital   Hypokalemia - Potassium 3.1> 4.0 after replacement     History of rheumatoid arthritis -Continue Plaquenil   Bipolar disorder - Continue Latuda, duloxetine, venlafaxine and Lamictal     Tobacco abuse He smokes 1-1/2 packs/day - cont nicotine patch - counseled    Hypertension - Resumed losartan   The patient is on Plavix and this has been resumed         Discharge Instructions  Discharge Instructions     Ambulatory referral to Physical Therapy   Complete by: As directed    Diet - low sodium heart healthy   Complete by: As directed    Diet Carb Modified   Complete by: As directed    Increase activity slowly   Complete by: As directed       Allergies as of 05/27/2022       Reactions   Celecoxib Anaphylaxis, Swelling, Rash   Tongue swells   Hydrocodone Rash, Other (See Comments)   "blisters developed on arms"   Sulfa Antibiotics Rash   Sulfacetamide Sodium Rash        Medication List     STOP taking these medications    amoxicillin-clavulanate 875-125 MG tablet Commonly known as: AUGMENTIN   azithromycin 250 MG tablet Commonly known as: ZITHROMAX       TAKE these medications    acetaminophen 500 MG  tablet Commonly known as: TYLENOL Take 1,000 mg by mouth every 4 (four) hours as needed for moderate pain or headache.   clopidogrel 75 MG tablet Commonly known as: PLAVIX Take 1 tablet (75 mg total) by mouth daily.   DULoxetine 60 MG capsule Commonly known as: CYMBALTA Take 1 capsule (60 mg total) by mouth daily.   fenofibrate 160 MG tablet Take 160 mg by mouth at bedtime.   HumaLOG KwikPen 200 UNIT/ML KwikPen Generic drug: insulin lispro Inject 10 Units into the skin in the morning, at noon, and at bedtime.   hydroxychloroquine 200 MG tablet Commonly known as:  PLAQUENIL Take 200 mg by mouth daily.   lamoTRIgine 150 MG tablet Commonly known as: LAMICTAL Take 1 tablet (150 mg total) by mouth 2 (two) times daily.   losartan 25 MG tablet Commonly known as: COZAAR Take 25 mg by mouth daily.   lurasidone 20 MG Tabs tablet Commonly known as: LATUDA Take 20 mg by mouth at bedtime.   lurasidone 80 MG Tabs tablet Commonly known as: Latuda Take 1 tablet (80 mg total) by mouth daily with breakfast.   omeprazole 40 MG capsule Commonly known as: PRILOSEC Take 40 mg by mouth daily.   pregabalin 200 MG capsule Commonly known as: LYRICA Take 200-400 mg by mouth in the morning and at bedtime. Take 200 mg by mouth in the morning and 400 mg by mouth at night   rosuvastatin 10 MG tablet Commonly known as: CRESTOR Take 1 tablet (10 mg total) by mouth daily. What changed: when to take this   tamsulosin 0.4 MG Caps capsule Commonly known as: FLOMAX Take 0.4 mg by mouth at bedtime.   Toujeo Max SoloStar 300 UNIT/ML Solostar Pen Generic drug: insulin glargine (2 Unit Dial) Inject 100 Units into the skin daily. What changed: how much to take   traZODone 150 MG tablet Commonly known as: DESYREL Take 1 tablet (150 mg total) by mouth at bedtime as needed for sleep.   venlafaxine XR 150 MG 24 hr capsule Commonly known as: EFFEXOR-XR Take 1 capsule (150 mg total) by mouth daily with breakfast.   Vitamin D (Ergocalciferol) 1.25 MG (50000 UNIT) Caps capsule Commonly known as: DRISDOL Take 50,000 Units by mouth every Wednesday.            The results of significant diagnostics from this hospitalization (including imaging, microbiology, ancillary and laboratory) are listed below for reference.    MR CERVICAL SPINE WO CONTRAST  Result Date: 05/26/2022 CLINICAL DATA:  Cervical radiculopathy, retroflexed. EXAM: MRI CERVICAL SPINE WITHOUT CONTRAST TECHNIQUE: Multiplanar, multisequence MR imaging of the cervical spine was performed. No intravenous  contrast was administered. COMPARISON:  MRI of the cervical spine June 06, 2021. FINDINGS: Alignment: Physiologic. Vertebrae: No fracture, evidence of discitis, or aggressive bone lesion. Hemangioma in the T3 vertebral body. Cord: Normal signal and morphology. Posterior Fossa, vertebral arteries, paraspinal tissues: Negative. Disc levels: C2-3: No spinal canal or neural foraminal stenosis. C3-4:Facet degenerative changes with associated facet fusion on the left side. No significant spinal canal or neural foraminal stenosis. C4-5: No spinal canal or neural foraminal stenosis. C5-6:Disc bulge causing mild indentation on the thecal sac without significant spinal canal or neural foraminal stenosis. No significant change since prior MRI. C6-7: Minimal disc bulge and mild uncovertebral degenerative changes resulting in minimal bilateral neural foraminal narrowing, new since prior MRI. No spinal canal stenosis. C7-T1: No spinal canal or neural foraminal stenosis. IMPRESSION: Mild degenerative changes of the cervical spine without high-grade  spinal canal or neural foraminal stenosis at any level. Electronically Signed   By: Pedro Earls M.D.   On: 05/26/2022 10:37   DG Chest Port 1 View  Result Date: 05/23/2022 CLINICAL DATA:  Hyperglycemia EXAM: PORTABLE CHEST 1 VIEW COMPARISON:  04/05/2022 FINDINGS: Stable cardiomediastinal silhouette. Aortic atherosclerotic calcification. No focal consolidation, pleural effusion, or pneumothorax. No acute osseous abnormality. Low lung volumes IMPRESSION: No active disease. Electronically Signed   By: Placido Sou M.D.   On: 05/23/2022 23:54   CT Head Wo Contrast  Result Date: 05/23/2022 CLINICAL DATA:  Weakness, acute stroke suspected. EXAM: CT HEAD WITHOUT CONTRAST TECHNIQUE: Contiguous axial images were obtained from the base of the skull through the vertex without intravenous contrast. RADIATION DOSE REDUCTION: This exam was performed according to the  departmental dose-optimization program which includes automated exposure control, adjustment of the mA and/or kV according to patient size and/or use of iterative reconstruction technique. COMPARISON:  None Available. FINDINGS: Brain: No acute intracranial hemorrhage. No focal mass lesion. No CT evidence of acute infarction. No midline shift or mass effect. No hydrocephalus. Basilar cisterns are patent. Vascular: No hyperdense vessel or unexpected calcification. Skull: Normal. Negative for fracture or focal lesion. Sinuses/Orbits: Paranasal sinuses and mastoid air cells are clear. Orbits are clear. Other: None. IMPRESSION: No acute intracranial findings.  No CT evidence of acute infarction. Electronically Signed   By: Suzy Bouchard M.D.   On: 05/23/2022 19:46   Labs:   Basic Metabolic Panel: No results for input(s): "NA", "K", "CL", "CO2", "GLUCOSE", "BUN", "CREATININE", "CALCIUM", "MG", "PHOS" in the last 168 hours.   CBC: No results for input(s): "WBC", "NEUTROABS", "HGB", "HCT", "MCV", "PLT" in the last 168 hours.       SIGNED:   Debbe Odea, MD  [facility] 06/08/2022, 9:54 AM

## 2022-06-12 ENCOUNTER — Encounter: Payer: Self-pay | Admitting: Physical Therapy

## 2022-06-12 ENCOUNTER — Ambulatory Visit: Payer: Medicare HMO | Admitting: Rehabilitative and Restorative Service Providers"

## 2022-06-12 ENCOUNTER — Ambulatory Visit: Payer: Medicare HMO | Attending: Internal Medicine | Admitting: Physical Therapy

## 2022-06-12 VITALS — BP 115/70 | HR 99

## 2022-06-12 DIAGNOSIS — Z9181 History of falling: Secondary | ICD-10-CM | POA: Diagnosis present

## 2022-06-12 DIAGNOSIS — R2681 Unsteadiness on feet: Secondary | ICD-10-CM | POA: Diagnosis present

## 2022-06-12 DIAGNOSIS — R2689 Other abnormalities of gait and mobility: Secondary | ICD-10-CM | POA: Insufficient documentation

## 2022-06-12 DIAGNOSIS — R278 Other lack of coordination: Secondary | ICD-10-CM | POA: Diagnosis present

## 2022-06-12 DIAGNOSIS — R208 Other disturbances of skin sensation: Secondary | ICD-10-CM | POA: Diagnosis present

## 2022-06-12 DIAGNOSIS — M6281 Muscle weakness (generalized): Secondary | ICD-10-CM | POA: Insufficient documentation

## 2022-06-12 NOTE — Therapy (Signed)
OUTPATIENT PHYSICAL THERAPY NEURO TREATMENT   Patient Name: Vincent [name] MRN: [id number] DOB:1965/09/02, 57 y.o., male Today's Date: 06/12/2022   PCP: Hayden Rasmussen, MD REFERRING PROVIDER: Debbe Odea, MD   END OF SESSION:  PT End of Session - 06/12/22 1111     Visit Number 4    Number of Visits 17    Date for PT Re-Evaluation 07/29/22    Authorization Type [employer]    PT Start Time 1111    PT Stop Time 1200    PT Time Calculation (min) 49 min    Equipment Utilized During Treatment Gait belt    Activity Tolerance Patient tolerated treatment well    Behavior During Therapy [facility] for tasks assessed/performed             Past Medical History:  Diagnosis Date   Anxiety    Benign essential tremor    Bipolar 2 disorder    followed by [facility]--- dr s. Adele Schilder   Bladder cancer    recurrent   CAD (coronary artery disease)    cardiac cath 2003  and 2011 both showed normal coronary arteries w/ preserved lvf;  Non obstructive on CTA Oct 2019.    Chronic pain syndrome    back---- [facility] [place]   Cold extremities    BLE   COPD (chronic obstructive pulmonary disease)    DDD (degenerative disc disease), lumbar    Diabetic peripheral neuropathy    Gastroparesis    followed by dr Henrene Pastor   GERD (gastroesophageal reflux disease)    Hiatal hernia    History of bladder cancer urologist-  previously dr Consuella Lose;  now dr gay   papillay TCC (Ta G1)  s/p TURBT and chemo instillation 2014   History of chest pain 12/2017   heart cath normal   History of encephalopathy 05/27/2015   admission w/ acute encephalopathy thought to be secondary to pain meds and COPD   History of gastric ulcer    History of Helicobacter pylori infection    History of kidney stones    History of TIA (transient ischemic attack) 2008  and 10-19-2018    no residual's   History of traumatic head injury 2010   w/ LOC  per pt needed stitches, hit in head with a mower blade    Hyperlipidemia    Hypertension    Hypogonadism male    s/p  bilateral orchiectomy   Hypothyroidism    Insomnia    Mild obstructive sleep apnea    study in epic 12-04-2016, no cpap   PTSD (post-traumatic stress disorder)    chronic   PTSD (post-traumatic stress disorder)    RA (rheumatoid arthritis)    followed by [facility]   Seizures, transient neurologist-  dr Krista Blue--  differential dx complex partial seizure .vs.  mood disorder .vs.  pseudoseizure--  negative EEG's   confusion episodes and staring spells since 11/ 2015   (03-26-2020 per pt wife last seizure 10 /2021)   Transient confusion NEUOROLOGIST-  DR Krista Blue   Episodes since 11/ 2015--  neurologist dx  differential complex partial seizure  .vs. mood disorder . vs. pseudoseizure   Type 2 diabetes mellitus treated with insulin    endocrinologist--- dr Loanne Drilling---  (03-26-2020 pt does not check blood sugar at home)   Past Surgical History:  Procedure Laterality Date   AMPUTATION Left 04/28/2020   Procedure: LEFT LITTLE FINGER AMPUTATION;  Surgeon: Newt Minion, MD;  Location: [facility]  OR;  Service: Orthopedics;  Laterality: Left;   CARDIAC CATHETERIZATION  12-27-2001  DR Einar Gip  &  05-26-2009  DR Irish Lack   RESULTS FOR BOTH ARE NORMAL CORONARIES AND PERSERVED LVF/ EF 60%   CARPAL TUNNEL RELEASE Bilateral right 09-16-2003;  left ?   CARPAL TUNNEL RELEASE Left 02/25/2015   Procedure: LEFT CARPAL TUNNEL RELEASE;  Surgeon: Leanora Cover, MD;  Location: [place];  Service: Orthopedics;  Laterality: Left;   CYSTOSCOPY N/A 10/10/2012   Procedure: CYSTOSCOPY CLOT EVACUATION FULGERATION OF BLEEDERS ;  Surgeon: Claybon Jabs, MD;  Location: [facility];  Service: Urology;  Laterality: N/A;   CYSTOSCOPY WITH BIOPSY N/A 11/26/2015   Procedure: CYSTOSCOPY WITH BIOPSY AND FULGURATION;  Surgeon: Kathie Rhodes, MD;  Location: [place];  Service: Urology;  Laterality: N/A;    ESOPHAGOGASTRODUODENOSCOPY  2014   LAPAROSCOPIC CHOLECYSTECTOMY  11-17-2010   ORCHIECTOMY Right 02/21/2016   Procedure: SCROTAL ORCHIECTOMY with TESTICULAR PROSTHESIS IMPLANT;  Surgeon: Kathie Rhodes, MD;  Location: [facility];  Service: Urology;  Laterality: Right;   ORCHIECTOMY Left 09/02/2018   Procedure: ORCHIECTOMY;  Surgeon: Kathie Rhodes, MD;  Location[facility];  Service: Urology;  Laterality: Left;   ROTATOR CUFF REPAIR Right 12/2004   TRANSURETHRAL RESECTION OF BLADDER TUMOR N/A 08/09/2012   Procedure: TRANSURETHRAL RESECTION OF BLADDER TUMOR (TURBT) WITH GYRUS WITH MITOMYCIN C;  Surgeon: Claybon Jabs, MD;  Location: [facility];  Service: Urology;  Laterality: N/A;   TRANSURETHRAL RESECTION OF BLADDER TUMOR N/A 03/29/2020   Procedure: TRANSURETHRAL RESECTION OF BLADDER TUMOR (TURBT) and post-op instillation of gemcitabine;  Surgeon: Janith Lima, MD;  Location: [facility];  Service: Urology;  Laterality: N/A;   TRANSURETHRAL RESECTION OF BLADDER TUMOR WITH GYRUS (TURBT-GYRUS) N/A 02/27/2014   Procedure: TRANSURETHRAL RESECTION OF BLADDER TUMOR WITH GYRUS (TURBT-GYRUS);  Surgeon: Claybon Jabs, MD;  Location: [facility];  Service: Urology;  Laterality: N/A;   Patient Active Problem List   Diagnosis Date Noted   Peripheral neuropathy in hands 06/08/2022   Hyperosmolar hyperglycemic state (HHS) 05/24/2022   Essential hypertension 05/23/2022   BPH (benign prostatic hyperplasia) 05/23/2022   Diabetic hyperosmolar non-ketotic state 04/05/2022   AKI (acute kidney injury) 09/04/2021   Hypotension 09/03/2021   History of seizure [id number]   Acute metabolic encephalopathy [id number]   Suicide attempt by drug overdose 07/28/2021   Cerebral thrombosis with cerebral infarction 11/16/2020   Stroke-like symptoms 11/14/2020   Hyperkalemia 11/14/2020   Ischemic stroke 06/10/2020   Hyperglycemia    Gangrene of  finger of left hand    Pain due to onychomycosis of toenails of both feet 01/28/2020   Hyperglycemia due to type 2 diabetes mellitus 01/07/2020   Resistance to insulin 01/07/2020   Right leg weakness 04/24/2019   Right leg pain 04/24/2019   Gait abnormality 04/24/2019   TIA (transient ischemic attack) 10/20/2018   DM2 (diabetes mellitus, type 2) 10/19/2018   Left sided numbness 10/19/2018   Diabetic autonomic neuropathy associated with type 2 diabetes mellitus 02/01/2018   DM type 2 with diabetic peripheral neuropathy 02/01/2018   Dyslipidemia 01/31/2018   Coronary artery disease involving native coronary artery of native heart without angina pectoris 01/31/2018   Chest pain 01/01/2018   Breakthrough seizure 12/19/2016   Essential tremor 12/19/2016   Chronic post-traumatic stress disorder (PTSD) 06/16/2016   Tremor 06/16/2016   Tobacco abuse 05/05/2016   Altered mental status 05/28/2015  Acute bronchitis 05/28/2015   Type 2 diabetes mellitus with hyperglycemia 05/28/2015   Hypothyroidism 05/28/2015   Bipolar 2 disorder 05/28/2015   History of rheumatoid arthritis 05/28/2015   Chronic pain 05/28/2015   Bipolar II disorder, most recent episode major depressive 03/18/2015   Carpal tunnel syndrome on left 02/10/2015   Carpal tunnel syndrome on right 02/10/2015   Diabetes 01/23/2015   Obesity (BMI 30-39.9) 03/06/2014   Acute encephalopathy 03/05/2014   Hyperlipidemia 03/05/2014   Anemia, normocytic normochromic 03/05/2014   Altered mental state    Helicobacter pylori (H. pylori) infection 11/20/2012   Protein-calorie malnutrition, severe 10/09/2012   Dysphagia 08/19/2012   Gastroparesis 08/19/2012   Bladder tumor 08/08/2012   Biliary dyskinesia 11/02/2010    ONSET DATE: 05/26/2022  REFERRING DIAG: R73.9 (ICD-10-CM) - Hyperglycemia R29.898 (ICD-10-CM) - Right leg weakness R20.0 (ICD-10-CM) - Left sided numbness  THERAPY DIAG:  Unsteadiness on feet  Muscle weakness  (generalized)  History of falling  Other abnormalities of gait and mobility  Rationale for Evaluation and Treatment: Rehabilitation  SUBJECTIVE:                                                                                                                                                                                             SUBJECTIVE STATEMENT: Patient reports one major fall yesterday. He fell and tripped and landed on chest and flipped over the couch. He reports hitting his head but denies any double vision, blurred vision, searing headache etc. He plans to call his doctor today to follow up. Patient reports that he took his blood sugar today and it was 147 and he drank coffee and took insulin. Denies eating breakfast or taking BP medications. States he was late to clinic due to flat tire. Patient arrives to session without AD but states he is getting his walker on Wednesday.   Pt accompanied by: self  PERTINENT HISTORY: PMH: HTN, diabetes mellitus type 2 who is noncompliant with medication, HTN, HLD, Acute Kidney Injury, Hypokalemia, bipolar disorder, rheumatoid arthritis, diabetic peripheral neuropathy, hx of TIAs, tobacco abuse, hx of CVA (pt reports it affected his L side)  Per Dr. Naaman Plummer note in hospital: Pt with poorly controlled diabetes who presented on 05/23/22 weakness, malaise, and diarrhea. He was found to have a CBG (Capillary blood glucose) of 1184 and a Na+ of 121. Hgb A1C was 14.1! He was placed on IVF fluids and insulin and sugars are better controlled but sill remain in the 300's. Pt c/o numbness in hands and distal lower extremities as well as ongoing weakness, left > right. The weakness is not all entirely new as  he has been experiencing symptoms for several months apparently. Prior to arrival he ambulating with a walker and could perform ADL's   PAIN:  Are you having pain? Yes: NPRS scale: 5 (neuropathy pain)/10 Pain location: Lower legs, arms and back, rib Pain  description: Constant, sharp  Aggravating factors: Nothing, stays the same unless getting exerted  Relieving factors: Not really   Vitals:   06/12/22 1119 06/12/22 1511  BP: 130/89 115/70  Pulse: 99   SpO2:  98%     PRECAUTIONS: Fall  WEIGHT BEARING RESTRICTIONS: No  FALLS: Has patient fallen in last 6 months? Yes. Number of falls 8, pt reports he has banged himself pretty good with these   LIVING ENVIRONMENT: Lives with:  girlfriend lives there 3x a week  Lives in: Mobile home Stairs: Yes: External: 6 steps; bilateral but cannot reach both Has following equipment at home: Single point cane, Walker - 2 wheeled, Environmental consultant - 4 wheeled, and shower chair  PLOF: Independent, Vocation/Vocational requirements: On disability (has been for years), and Leisure: going to [employer] races  pt reports he is still driving, Pt's girlfriend helps pt get dressed, take a shower, cooking him dinner, chores   PATIENT GOALS: Wants to get legs stronger, get better at feeding himself and feeling in hands   OBJECTIVE:   DIAGNOSTIC FINDINGS: CT head 05/23/22: IMPRESSION: No acute intracranial findings.  No CT evidence of acute infarction.    COGNITION: Overall cognitive status: Within functional limits for tasks assessed   TODAY'S TREATMENT:                                                                                                                               NMR: Tall kneel hip hinge with UE support on bench on mat table 3 x 10 - required 1x seated rest break after completing and increased fatgiue (min A to get onto mat table) (CGA)  Tall kneel static balance x 30" without major sway on mat table (reports relatively easy) (CGA)  Tall kneel balance with eyes closed 2 x 30" with min-mod sway and no UE support (CGA)  TherAct: Patient asks to sit down and reports new onset of blurry vision. Therapist assesses vitals. SPO2 within normal limits and possible orthostatic drop in BP noted though patient  tremor can result in fluctuations in readings; reassessed vitals a few minutes later and they were stable in 115/70s. As patient sat longer, reported resolution of symptoms. Offered patient a snack due to not eating breakfast this morning and concern for drop in glucose but patient reported he knew what it felt like when his glucose dropped and more concerned for hyperglycemia given vision changes. Therapist offered to call someone for the patient and recommended calling physician team to be checked out or ED if unable to be seen today. Patient confirmed he would call PCP for assessment but refused to have someone drive him or for someone to be called to pick  him up. Patient attempted to ambulated out of clinic with 2WW provided by therapist but due to level of fatigue and new onset of blurry vision required use of transport chair. Therapist and OT both offered to call and recommended patient sit in clinic for observation but patient refused and stated he had just a short drive to doctor's office. Reported improvement in symptoms and that he felt fine to be left alone at this time. Advised holding OT eval until patient can be evaulated.   PATIENT EDUCATION: Education details: follow up with PCP, check glucose when get home Person educated: Patient Education method: Explanation, Demonstration, and Handouts Education comprehension: verbalized understanding and returned demonstration  HOME EXERCISE PROGRAM: Access Code: [email or web address] URL: [email or web address] Date: 06/08/2022 Prepared by: Janann August  Exercises - Supine Bridge  - 1 x daily - 7 x weekly - 2 sets - 10 reps - Clamshell  - 1 x daily - 7 x weekly - 2 sets - 10-20 reps - Supine March  - 1 x daily - 7 x weekly - 2 sets - 20 reps - Sitting Knee Extension with Resistance  - 1 x daily - 7 x weekly - 2 sets - 10 reps  GOALS: Goals reviewed with patient? Yes  SHORT TERM GOALS: Target date: 06/27/2022  Pt will be independent  with initial HEP in order to build upon functional gains made in therapy. Baseline: Goal status: INITIAL  2.  Pt will improve BERG to at least a 18/56 in order to demo decr fall risk.  Baseline: 11/56 Goal status: INITIAL  3.  Pt will verbalize understanding of fall prevention in the home.  Baseline:  Goal status: INITIAL  4.  Pt will improve gait speed with RW vs. LRAD to at least 1.3 ft/sec in order to demo decr fall risk.  Baseline:  33.34 seconds with RW = .98 ft/sec  Goal status: INITIAL  5.  Pt will improve 5x sit<>stand to less than or equal to 44 sec with BUE support to demonstrate improved functional strength and transfer efficiency.   Baseline:  53.62 seconds with BUE support, unable to come fully to stand during last 2 reps, pt very fatigued afterwards   Goal status: INITIAL    LONG TERM GOALS: Target date: 07/25/2022  Pt will be independent with final HEP in order to build upon functional gains made in therapy. Baseline:  Goal status: INITIAL  2.  Pt will improve BERG to at least a 28/56 in order to demo decr fall risk. Baseline: 11/56 Goal status: INITIAL  3.  Pt will go up and down 6 steps with step to pattern and single handrail with mod I in order to safely get in and out of his home.  Baseline: pt reports having a fall down his steps.  Goal status: INITIAL  4.   Pt will improve 5x sit<>stand to less than or equal to 30 sec with BUE support to demonstrate improved functional strength and transfer efficiency.  Baseline: 53.62 seconds with BUE support, unable to come fully to stand during last 2 reps, pt very fatigued afterwards   Goal status: INITIAL  5.    Pt will improve gait speed with RW vs. LRAD to at least 2.0 ft/sec in order to demo improved community mobility.  Baseline: 33.34 seconds with RW = .98 ft/sec  Goal status: INITIAL    ASSESSMENT:  CLINICAL IMPRESSION: Session extremely limited but sudden onset of blurry vision noted in today's  session. See above for  full course of action of session; recommended follow up with PCP or ED for immediate assessment today especially given fall with hit to head within the last 48 hours. Patient verbalized understanding. Will continue to progress towards LTGs as able.   OBJECTIVE IMPAIRMENTS: Abnormal gait, decreased activity tolerance, decreased balance, decreased coordination, decreased endurance, decreased knowledge of condition, decreased knowledge of use of DME, decreased mobility, difficulty walking, decreased ROM, decreased strength, decreased safety awareness, impaired flexibility, impaired sensation, impaired UE functional use, postural dysfunction, and pain.   ACTIVITY LIMITATIONS: carrying, lifting, standing, stairs, transfers, bathing, dressing, self feeding, hygiene/grooming, and locomotion level  PARTICIPATION LIMITATIONS: meal prep, cleaning, laundry, shopping, and community activity  PERSONAL FACTORS: Age, Behavior pattern, Past/current experiences, Time since onset of injury/illness/exacerbation, and 3+ comorbidities: HTN, diabetes mellitus type 2 who is noncompliant with medication, HTN, HLD, Acute Kidney Injury, Hypokalemia, bipolar disorder, rheumatoid arthritis, diabetic peripheral neuropathy, hx of TIAs, tobacco abuse, hx of CVA (pt reports it affected his L side)  are also affecting patient's functional outcome.   REHAB POTENTIAL: Good  CLINICAL DECISION MAKING: Evolving/moderate complexity  EVALUATION COMPLEXITY: Moderate  PLAN:  PT FREQUENCY: 2x/week  PT DURATION: 8 weeks  PLANNED INTERVENTIONS: Therapeutic exercises, Therapeutic activity, Neuromuscular re-education, Balance training, Gait training, Patient/Family education, Self Care, Stair training, Vestibular training, Visual/preceptual remediation/compensation, Orthotic/Fit training, DME instructions, Manual therapy, and Re-evaluation  PLAN FOR NEXT SESSION: did pt get RW yet? strength, balance as able,  proximal stability/dynamic stepping reaction (tolerate tall kneeling or quadruped for strengthening?), standing tolerance, follow up with PCP about last session and blurry vision?  Esperanza Heir, PT, DPT  06/12/2022, 3:24 PM

## 2022-06-15 ENCOUNTER — Encounter: Payer: Self-pay | Admitting: Physical Therapy

## 2022-06-15 ENCOUNTER — Ambulatory Visit: Payer: Medicare HMO | Admitting: Physical Therapy

## 2022-06-15 VITALS — BP 143/78 | HR 83

## 2022-06-15 DIAGNOSIS — M6281 Muscle weakness (generalized): Secondary | ICD-10-CM

## 2022-06-15 DIAGNOSIS — R2681 Unsteadiness on feet: Secondary | ICD-10-CM | POA: Diagnosis not present

## 2022-06-15 DIAGNOSIS — R2689 Other abnormalities of gait and mobility: Secondary | ICD-10-CM

## 2022-06-15 DIAGNOSIS — R208 Other disturbances of skin sensation: Secondary | ICD-10-CM

## 2022-06-15 DIAGNOSIS — Z9181 History of falling: Secondary | ICD-10-CM

## 2022-06-15 NOTE — Therapy (Signed)
OUTPATIENT PHYSICAL THERAPY NEURO TREATMENT   Patient Name: Vincent [name] MRN: [id number] DOB:1965-04-27, 57 y.o., male Today's Date: 06/15/2022  PCP: Hayden Rasmussen, MD REFERRING PROVIDER: Debbe Odea, MD   END OF SESSION:  PT End of Session - 06/15/22 1102     Visit Number 5    Number of Visits 17    Date for PT Re-Evaluation 07/29/22    Authorization Type [employer]    PT Start Time 1101    PT Stop Time 1145    PT Time Calculation (min) 44 min    Equipment Utilized During Treatment Gait belt    Activity Tolerance Patient tolerated treatment well    Behavior During Therapy [facility] for tasks assessed/performed             Past Medical History:  Diagnosis Date   Anxiety    Benign essential tremor    Bipolar 2 disorder    followed by [facility]--- dr s. Adele Schilder   Bladder cancer    recurrent   CAD (coronary artery disease)    cardiac cath 2003  and 2011 both showed normal coronary arteries w/ preserved lvf;  Non obstructive on CTA Oct 2019.    Chronic pain syndrome    back---- [facility] [place]   Cold extremities    BLE   COPD (chronic obstructive pulmonary disease)    DDD (degenerative disc disease), lumbar    Diabetic peripheral neuropathy    Gastroparesis    followed by dr Henrene Pastor   GERD (gastroesophageal reflux disease)    Hiatal hernia    History of bladder cancer urologist-  previously dr Consuella Lose;  now dr gay   papillay TCC (Ta G1)  s/p TURBT and chemo instillation 2014   History of chest pain 12/2017   heart cath normal   History of encephalopathy 05/27/2015   admission w/ acute encephalopathy thought to be secondary to pain meds and COPD   History of gastric ulcer    History of Helicobacter pylori infection    History of kidney stones    History of TIA (transient ischemic attack) 2008  and 10-19-2018    no residual's   History of traumatic head injury 2010   w/ LOC  per pt needed stitches, hit in head with a mower blade    Hyperlipidemia    Hypertension    Hypogonadism male    s/p  bilateral orchiectomy   Hypothyroidism    Insomnia    Mild obstructive sleep apnea    study in epic 12-04-2016, no cpap   PTSD (post-traumatic stress disorder)    chronic   PTSD (post-traumatic stress disorder)    RA (rheumatoid arthritis)    followed by [facility]   Seizures, transient neurologist-  dr Krista Blue--  differential dx complex partial seizure .vs.  mood disorder .vs.  pseudoseizure--  negative EEG's   confusion episodes and staring spells since 11/ 2015   (03-26-2020 per pt wife last seizure 10 /2021)   Transient confusion NEUOROLOGIST-  DR Krista Blue   Episodes since 11/ 2015--  neurologist dx  differential complex partial seizure  .vs. mood disorder . vs. pseudoseizure   Type 2 diabetes mellitus treated with insulin    endocrinologist--- dr Loanne Drilling---  (03-26-2020 pt does not check blood sugar at home)   Past Surgical History:  Procedure Laterality Date   AMPUTATION Left 04/28/2020   Procedure: LEFT LITTLE FINGER AMPUTATION;  Surgeon: Newt Minion, MD;  Location: [place];  Service: Orthopedics;  Laterality: Left;   CARDIAC CATHETERIZATION  12-27-2001  DR Einar Gip  &  05-26-2009  DR Irish Lack   RESULTS FOR BOTH ARE NORMAL CORONARIES AND PERSERVED LVF/ EF 60%   CARPAL TUNNEL RELEASE Bilateral right 09-16-2003;  left ?   CARPAL TUNNEL RELEASE Left 02/25/2015   Procedure: LEFT CARPAL TUNNEL RELEASE;  Surgeon: Leanora Cover, MD;  Location: [place];  Service: Orthopedics;  Laterality: Left;   CYSTOSCOPY N/A 10/10/2012   Procedure: CYSTOSCOPY CLOT EVACUATION FULGERATION OF BLEEDERS ;  Surgeon: Claybon Jabs, MD;  Location: [facility];  Service: Urology;  Laterality: N/A;   CYSTOSCOPY WITH BIOPSY N/A 11/26/2015   Procedure: CYSTOSCOPY WITH BIOPSY AND FULGURATION;  Surgeon: Kathie Rhodes, MD;  Location: [place];  Service: Urology;  Laterality: N/A;    ESOPHAGOGASTRODUODENOSCOPY  2014   LAPAROSCOPIC CHOLECYSTECTOMY  11-17-2010   ORCHIECTOMY Right 02/21/2016   Procedure: SCROTAL ORCHIECTOMY with TESTICULAR PROSTHESIS IMPLANT;  Surgeon: Kathie Rhodes, MD;  Location: [facility];  Service: Urology;  Laterality: Right;   ORCHIECTOMY Left 09/02/2018   Procedure: ORCHIECTOMY;  Surgeon: Kathie Rhodes, MD;  Location: [facility];  Service: Urology;  Laterality: Left;   ROTATOR CUFF REPAIR Right 12/2004   TRANSURETHRAL RESECTION OF BLADDER TUMOR N/A 08/09/2012   Procedure: TRANSURETHRAL RESECTION OF BLADDER TUMOR (TURBT) WITH GYRUS WITH MITOMYCIN C;  Surgeon: Claybon Jabs, MD;  Location: [facility];  Service: Urology;  Laterality: N/A;   TRANSURETHRAL RESECTION OF BLADDER TUMOR N/A 03/29/2020   Procedure: TRANSURETHRAL RESECTION OF BLADDER TUMOR (TURBT) and post-op instillation of gemcitabine;  Surgeon: Janith Lima, MD;  Location[facility];  Service: Urology;  Laterality: N/A;   TRANSURETHRAL RESECTION OF BLADDER TUMOR WITH GYRUS (TURBT-GYRUS) N/A 02/27/2014   Procedure: TRANSURETHRAL RESECTION OF BLADDER TUMOR WITH GYRUS (TURBT-GYRUS);  Surgeon: Claybon Jabs, MD;  Location: [facility];  Service: Urology;  Laterality: N/A;   Patient Active Problem List   Diagnosis Date Noted   Peripheral neuropathy in hands 06/08/2022   Hyperosmolar hyperglycemic state (HHS) 05/24/2022   Essential hypertension 05/23/2022   BPH (benign prostatic hyperplasia) 05/23/2022   Diabetic hyperosmolar non-ketotic state 04/05/2022   AKI (acute kidney injury) 09/04/2021   Hypotension 09/03/2021   History of seizure [id number]   Acute metabolic encephalopathy [id number]   Suicide attempt by drug overdose 07/28/2021   Cerebral thrombosis with cerebral infarction 11/16/2020   Stroke-like symptoms 11/14/2020   Hyperkalemia 11/14/2020   Ischemic stroke 06/10/2020   Hyperglycemia    Gangrene of  finger of left hand    Pain due to onychomycosis of toenails of both feet 01/28/2020   Hyperglycemia due to type 2 diabetes mellitus 01/07/2020   Resistance to insulin 01/07/2020   Right leg weakness 04/24/2019   Right leg pain 04/24/2019   Gait abnormality 04/24/2019   TIA (transient ischemic attack) 10/20/2018   DM2 (diabetes mellitus, type 2) 10/19/2018   Left sided numbness 10/19/2018   Diabetic autonomic neuropathy associated with type 2 diabetes mellitus 02/01/2018   DM type 2 with diabetic peripheral neuropathy 02/01/2018   Dyslipidemia 01/31/2018   Coronary artery disease involving native coronary artery of native heart without angina pectoris 01/31/2018   Chest pain 01/01/2018   Breakthrough seizure 12/19/2016   Essential tremor 12/19/2016   Chronic post-traumatic stress disorder (PTSD) 06/16/2016   Tremor 06/16/2016   Tobacco abuse 05/05/2016   Altered mental status 05/28/2015   Acute  bronchitis 05/28/2015   Type 2 diabetes mellitus with hyperglycemia 05/28/2015   Hypothyroidism 05/28/2015   Bipolar 2 disorder 05/28/2015   History of rheumatoid arthritis 05/28/2015   Chronic pain 05/28/2015   Bipolar II disorder, most recent episode major depressive 03/18/2015   Carpal tunnel syndrome on left 02/10/2015   Carpal tunnel syndrome on right 02/10/2015   Diabetes 01/23/2015   Obesity (BMI 30-39.9) 03/06/2014   Acute encephalopathy 03/05/2014   Hyperlipidemia 03/05/2014   Anemia, normocytic normochromic 03/05/2014   Altered mental state    Helicobacter pylori (H. pylori) infection 11/20/2012   Protein-calorie malnutrition, severe 10/09/2012   Dysphagia 08/19/2012   Gastroparesis 08/19/2012   Bladder tumor 08/08/2012   Biliary dyskinesia 11/02/2010    ONSET DATE: 05/26/2022  REFERRING DIAG: R73.9 (ICD-10-CM) - Hyperglycemia R29.898 (ICD-10-CM) - Right leg weakness R20.0 (ICD-10-CM) - Left sided numbness  THERAPY DIAG:  Unsteadiness on feet  Muscle weakness  (generalized)  History of falling  Other abnormalities of gait and mobility  Other disturbances of skin sensation  Rationale for Evaluation and Treatment: Rehabilitation  SUBJECTIVE:                                                                                                                                                                                             SUBJECTIVE STATEMENT: Patient reports that he is doing alright. Arrives to session with rollator. States that he followed up with his PCP after last session who is referring out for an eye exam. He also had bloodwork run is awaiting results. Patient was given a medication for pancreas/to help sleep at night but cannot recall what they are named. Patient reports 1 falls since he was last here that happened when he was going to try to sit on couch. Reports that he did not hit the floor this time. Patient reports getting a fall tag similar to Life Alert with direct contact to emergency services. Patient states he feels like he is getting stronger since starting therapy.   Pt accompanied by: self  PERTINENT HISTORY: PMH: HTN, diabetes mellitus type 2 who is noncompliant with medication, HTN, HLD, Acute Kidney Injury, Hypokalemia, bipolar disorder, rheumatoid arthritis, diabetic peripheral neuropathy, hx of TIAs, tobacco abuse, hx of CVA (pt reports it affected his L side)  Per Dr. Naaman Plummer note in hospital: Pt with poorly controlled diabetes who presented on 05/23/22 weakness, malaise, and diarrhea. He was found to have a CBG (Capillary blood glucose) of 1184 and a Na+ of 121. Hgb A1C was 14.1! He was placed on IVF fluids and insulin and sugars are better controlled but sill remain in the 300's.  Pt c/o numbness in hands and distal lower extremities as well as ongoing weakness, left > right. The weakness is not all entirely new as he has been experiencing symptoms for several months apparently. Prior to arrival he ambulating with a walker  and could perform ADL's   PAIN:  Are you having pain? Yes: NPRS scale: 4 (neuropathy pain)/10 Pain location: Lower legs, arms Pain description: Constant, sharp  Aggravating factors: Nothing, stays the same unless getting exerted  Relieving factors: Not really   Vitals:   06/15/22 1112  BP: (!) 143/78  Pulse: 83     PRECAUTIONS: Fall  WEIGHT BEARING RESTRICTIONS: No  FALLS: Has patient fallen in last 6 months? Yes. Number of falls 8, pt reports he has banged himself pretty good with these   LIVING ENVIRONMENT: Lives with:  girlfriend lives there 3x a week  Lives in: Mobile home Stairs: Yes: External: 6 steps; bilateral but cannot reach both Has following equipment at home: Single point cane, Walker - 2 wheeled, Environmental consultant - 4 wheeled, and shower chair  PLOF: Independent, Vocation/Vocational requirements: On disability (has been for years), and Leisure: going to [employer] races  pt reports he is still driving, Pt's girlfriend helps pt get dressed, take a shower, cooking him dinner, chores   PATIENT GOALS: Wants to get legs stronger, get better at feeding himself and feeling in hands   OBJECTIVE:   DIAGNOSTIC FINDINGS: CT head 05/23/22: IMPRESSION: No acute intracranial findings.  No CT evidence of acute infarction.    COGNITION: Overall cognitive status: Within functional limits for tasks assessed   TODAY'S TREATMENT:                                                                                                                               TherAct: Education on AD safety. Patient arrives to session with new rollator. Attempts to sit down in chair with rollator far away and then taking unsteady steps to chair. Locks not used. Therapist advises patient to bring rollator back to chair. Discuss rollator safety including using safety breaks with transfers, not using rollator as a wheelchair, and how to back it against a wall with breaks to sit safely. Patient then demonstrated and  completed with min cues and verbalized understanding with teach back method at end of session.   TherEx: Bridges 1 x 12 without resistance, 1 x 12 with yellow theraband, 1 x 14 with yellow theraband Bent knee fallouts in hooklying with yellow theraband 3 x 12 TA marching with yellow theraband 1 x 20 reps  Practiced taking theraband off technique with sitting on EOM. Required minA to sit up due to UE fatigue at end of session.   PATIENT EDUCATION: Education details: Management consultant as noted above, continue with updated HEP Person educated: Patient Education method: Explanation, Demonstration, and Handouts Education comprehension: verbalized understanding and returned demonstration  HOME EXERCISE PROGRAM: Access Code: [email or web address] URL: [email or web address] Date: 06/15/2022 Prepared by: Malachi Carl  Exercises - Clamshell  - 1 x daily - 7 x weekly - 2 sets - 10-20 reps - Sitting Knee Extension with Resistance  - 1 x daily - 7 x weekly - 2 sets - 10 reps - Supine Bridge with Resistance Band  - 1 x daily - 7 x weekly - 3 sets - 12 reps - Hooklying Single Leg Bent Knee Fallouts with Resistance  - 1 x daily - 7 x weekly - 3 sets - 12 reps - Supine March with Resistance Band  - 1 x daily - 7 x weekly - 2 sets - 20 reps  GOALS: Goals reviewed with patient? Yes  SHORT TERM GOALS: Target date: 06/27/2022  Pt will be independent with initial HEP in order to build upon functional gains made in therapy. Baseline: Goal status: INITIAL  2.  Pt will improve BERG to at least a 18/56 in order to demo decr fall risk.  Baseline: 11/56 Goal status: INITIAL  3.  Pt will verbalize understanding of fall prevention in the home.  Baseline:  Goal status: INITIAL  4.  Pt will improve gait speed with RW vs. LRAD to at least 1.3 ft/sec in order to demo decr fall risk.  Baseline:  33.34 seconds with RW = .98 ft/sec  Goal status: INITIAL  5.  Pt will improve 5x sit<>stand to less than or  equal to 44 sec with BUE support to demonstrate improved functional strength and transfer efficiency.   Baseline:  53.62 seconds with BUE support, unable to come fully to stand during last 2 reps, pt very fatigued afterwards   Goal status: INITIAL    LONG TERM GOALS: Target date: 07/25/2022  Pt will be independent with final HEP in order to build upon functional gains made in therapy. Baseline:  Goal status: INITIAL  2.  Pt will improve BERG to at least a 28/56 in order to demo decr fall risk. Baseline: 11/56 Goal status: INITIAL  3.  Pt will go up and down 6 steps with step to pattern and single handrail with mod I in order to safely get in and out of his home.  Baseline: pt reports having a fall down his steps.  Goal status: INITIAL  4.   Pt will improve 5x sit<>stand to less than or equal to 30 sec with BUE support to demonstrate improved functional strength and transfer efficiency.  Baseline: 53.62 seconds with BUE support, unable to come fully to stand during last 2 reps, pt very fatigued afterwards   Goal status: INITIAL  5.    Pt will improve gait speed with RW vs. LRAD to at least 2.0 ft/sec in order to demo improved community mobility.  Baseline: 33.34 seconds with RW = .98 ft/sec  Goal status: INITIAL    ASSESSMENT:  CLINICAL IMPRESSION: Session emphasized safe use of new rollator patient received on Wednesday in addition to progression of mat level exercises with updated HEP. Patient reports that he is still only using SPC in home due to not being able to get his rollator up and down his stairs and therapist advised patient to look at loan closests for a rollator or walker he can keep in his home for safety. Patient required min A at end of session to sit up at end of session so recommend education on techniques for mat/bed mobility in future sessions. Patient will benefit from continued skilled physical therapy services to address functional needs and improve safety in  home.   OBJECTIVE IMPAIRMENTS: Abnormal  gait, decreased activity tolerance, decreased balance, decreased coordination, decreased endurance, decreased knowledge of condition, decreased knowledge of use of DME, decreased mobility, difficulty walking, decreased ROM, decreased strength, decreased safety awareness, impaired flexibility, impaired sensation, impaired UE functional use, postural dysfunction, and pain.   ACTIVITY LIMITATIONS: carrying, lifting, standing, stairs, transfers, bathing, dressing, self feeding, hygiene/grooming, and locomotion level  PARTICIPATION LIMITATIONS: meal prep, cleaning, laundry, shopping, and community activity  PERSONAL FACTORS: Age, Behavior pattern, Past/current experiences, Time since onset of injury/illness/exacerbation, and 3+ comorbidities: HTN, diabetes mellitus type 2 who is noncompliant with medication, HTN, HLD, Acute Kidney Injury, Hypokalemia, bipolar disorder, rheumatoid arthritis, diabetic peripheral neuropathy, hx of TIAs, tobacco abuse, hx of CVA (pt reports it affected his L side)  are also affecting patient's functional outcome.   REHAB POTENTIAL: Good  CLINICAL DECISION MAKING: Evolving/moderate complexity  EVALUATION COMPLEXITY: Moderate  PLAN:  PT FREQUENCY: 2x/week  PT DURATION: 8 weeks  PLANNED INTERVENTIONS: Therapeutic exercises, Therapeutic activity, Neuromuscular re-education, Balance training, Gait training, Patient/Family education, Self Care, Stair training, Vestibular training, Visual/preceptual remediation/compensation, Orthotic/Fit training, DME instructions, Manual therapy, and Re-evaluation  PLAN FOR NEXT SESSION: did pt get RW yet? strength, balance as able, proximal stability/dynamic stepping reaction (tolerate tall kneeling or quadruped for strengthening?), standing tolerance, stepping reaction with UE support, type of bracing to prevent knee buckling, rolling and sitting up techniques, follow up about blood work  results  Esperanza Heir, PT, DPT  06/15/2022, 12:08 PM

## 2022-06-19 ENCOUNTER — Telehealth: Payer: Self-pay | Admitting: Rehabilitative and Restorative Service Providers"

## 2022-06-19 ENCOUNTER — Ambulatory Visit: Payer: Medicare HMO | Admitting: Physical Therapy

## 2022-06-19 ENCOUNTER — Ambulatory Visit: Payer: Medicare HMO | Admitting: Rehabilitative and Restorative Service Providers"

## 2022-06-19 NOTE — Telephone Encounter (Signed)
OT called patient to discuss missed OT Eval, asked him to call back and reschedule if needed. He states thinking his appointment was tomorrow and that he will not be able to make his PT appt today, either. He states "really needing therapy because his hands hurt."

## 2022-06-20 ENCOUNTER — Other Ambulatory Visit: Payer: Self-pay | Admitting: Family Medicine

## 2022-06-20 ENCOUNTER — Encounter: Payer: Self-pay | Admitting: Family Medicine

## 2022-06-20 DIAGNOSIS — R1011 Right upper quadrant pain: Secondary | ICD-10-CM

## 2022-06-21 ENCOUNTER — Other Ambulatory Visit: Payer: Self-pay

## 2022-06-21 ENCOUNTER — Emergency Department (HOSPITAL_BASED_OUTPATIENT_CLINIC_OR_DEPARTMENT_OTHER): Payer: Medicare HMO

## 2022-06-21 ENCOUNTER — Encounter (HOSPITAL_BASED_OUTPATIENT_CLINIC_OR_DEPARTMENT_OTHER): Payer: Self-pay

## 2022-06-21 ENCOUNTER — Emergency Department (HOSPITAL_BASED_OUTPATIENT_CLINIC_OR_DEPARTMENT_OTHER)
Admission: EM | Admit: 2022-06-21 | Discharge: 2022-06-21 | Disposition: A | Discharge status: Home / Self Care | Payer: Medicare HMO | Attending: Emergency Medicine | Admitting: Emergency Medicine

## 2022-06-21 DIAGNOSIS — R748 Abnormal levels of other serum enzymes: Secondary | ICD-10-CM | POA: Insufficient documentation

## 2022-06-21 DIAGNOSIS — K2900 Acute gastritis without bleeding: Secondary | ICD-10-CM | POA: Insufficient documentation

## 2022-06-21 DIAGNOSIS — Z79899 Other long term (current) drug therapy: Secondary | ICD-10-CM | POA: Diagnosis not present

## 2022-06-21 DIAGNOSIS — R1013 Epigastric pain: Secondary | ICD-10-CM | POA: Diagnosis present

## 2022-06-21 DIAGNOSIS — I10 Essential (primary) hypertension: Secondary | ICD-10-CM | POA: Insufficient documentation

## 2022-06-21 DIAGNOSIS — E119 Type 2 diabetes mellitus without complications: Secondary | ICD-10-CM | POA: Insufficient documentation

## 2022-06-21 DIAGNOSIS — K2970 Gastritis, unspecified, without bleeding: Secondary | ICD-10-CM

## 2022-06-21 DIAGNOSIS — R109 Unspecified abdominal pain: Secondary | ICD-10-CM

## 2022-06-21 DIAGNOSIS — Z794 Long term (current) use of insulin: Secondary | ICD-10-CM | POA: Insufficient documentation

## 2022-06-21 LAB — URINALYSIS, ROUTINE W REFLEX MICROSCOPIC
Bacteria, UA: NONE SEEN
Bilirubin Urine: NEGATIVE
Glucose, UA: NEGATIVE mg/dL
Ketones, ur: NEGATIVE mg/dL
Leukocytes,Ua: NEGATIVE
Nitrite: NEGATIVE
Protein, ur: 30 mg/dL — AB
Specific Gravity, Urine: <1.005 — ABNORMAL LOW (ref 1.005–1.030)
pH: 5.5 (ref 5.0–8.0)

## 2022-06-21 LAB — COMPREHENSIVE METABOLIC PANEL
ALT: 10 U/L (ref 0–44)
AST: 14 U/L — ABNORMAL LOW (ref 15–41)
Albumin: 4 g/dL (ref 3.5–5.0)
Alkaline Phosphatase: 51 U/L (ref 38–126)
Anion gap: 9 (ref 5–15)
BUN: 29 mg/dL — ABNORMAL HIGH (ref 6–20)
CO2: 24 mmol/L (ref 22–32)
Calcium: 9.2 mg/dL (ref 8.9–10.3)
Chloride: 103 mmol/L (ref 98–111)
Creatinine, Ser: 1.23 mg/dL (ref 0.61–1.24)
GFR, Estimated: >60 mL/min (ref >60)
Glucose, Bld: 333 mg/dL — ABNORMAL HIGH (ref 70–99)
Potassium: 4.6 mmol/L (ref 3.5–5.1)
Sodium: 136 mmol/L (ref 135–145)
Total Bilirubin: 0.4 mg/dL (ref 0.3–1.2)
Total Protein: 6.5 g/dL (ref 6.5–8.1)

## 2022-06-21 LAB — CBC
HCT: 35.7 % — ABNORMAL LOW (ref 39.0–52.0)
Hemoglobin: 12.2 g/dL — ABNORMAL LOW (ref 13.0–17.0)
MCH: 29 pg (ref 26.0–34.0)
MCHC: 34.2 g/dL (ref 30.0–36.0)
MCV: 85 fL (ref 80.0–100.0)
Platelets: 288 10*3/uL (ref 150–400)
RBC: 4.2 MIL/uL — ABNORMAL LOW (ref 4.22–5.81)
RDW: 13 % (ref 11.5–15.5)
WBC: 6.6 10*3/uL (ref 4.0–10.5)
nRBC: 0 % (ref 0.0–0.2)

## 2022-06-21 LAB — LIPASE, BLOOD: Lipase: 107 U/L — ABNORMAL HIGH (ref 11–51)

## 2022-06-21 MED ORDER — LACTATED RINGERS IV BOLUS
1000.0000 mL | Freq: Once | INTRAVENOUS | Status: AC
  Administered 2022-06-21: 1000 mL via INTRAVENOUS

## 2022-06-21 MED ORDER — ALUM & MAG HYDROXIDE-SIMETH 200-200-20 MG/5ML PO SUSP
30.0000 mL | Freq: Once | ORAL | Status: AC
  Administered 2022-06-21: 30 mL via ORAL
  Filled 2022-06-21: qty 30

## 2022-06-21 MED ORDER — FAMOTIDINE 20 MG PO TABS: 20.0000 mg | ORAL_TABLET | Freq: Two times a day (BID) | ORAL | 0 refills | Status: DC

## 2022-06-21 MED ORDER — PANTOPRAZOLE SODIUM 40 MG IV SOLR
40.0000 mg | Freq: Once | INTRAVENOUS | Status: AC
  Administered 2022-06-21: 40 mg via INTRAVENOUS
  Filled 2022-06-21: qty 10

## 2022-06-21 MED ORDER — IOHEXOL 350 MG/ML SOLN: 100.0000 mL | Freq: Once | INTRAVENOUS | Status: DC | PRN

## 2022-06-21 MED ORDER — ONDANSETRON 4 MG PO TBDP: 4.0000 mg | ORAL_TABLET | Freq: Three times a day (TID) | ORAL | 0 refills | Status: DC | PRN

## 2022-06-21 MED ORDER — ONDANSETRON HCL 4 MG/2ML IJ SOLN
4.0000 mg | Freq: Once | INTRAMUSCULAR | Status: AC
  Administered 2022-06-21: 4 mg via INTRAVENOUS
  Filled 2022-06-21: qty 2

## 2022-06-21 MED ORDER — MORPHINE SULFATE (PF) 4 MG/ML IV SOLN
4.0000 mg | Freq: Once | INTRAVENOUS | Status: AC
  Administered 2022-06-21: 4 mg via INTRAVENOUS
  Filled 2022-06-21: qty 1

## 2022-06-21 MED ORDER — DICYCLOMINE HCL 20 MG PO TABS: 20.0000 mg | ORAL_TABLET | Freq: Two times a day (BID) | ORAL | 0 refills | Status: DC | PRN

## 2022-06-21 MED ORDER — IOHEXOL 300 MG/ML  SOLN
100.0000 mL | Freq: Once | INTRAMUSCULAR | Status: AC | PRN
  Administered 2022-06-21: 85 mL via INTRAVENOUS

## 2022-06-21 MED ORDER — OMEPRAZOLE 20 MG PO CPDR: 20.0000 mg | DELAYED_RELEASE_CAPSULE | Freq: Every day | ORAL | 0 refills | Status: DC

## 2022-06-21 NOTE — ED Notes (Signed)
Patient transported to CT 

## 2022-06-21 NOTE — ED Notes (Signed)
ED Provider at bedside. 

## 2022-06-21 NOTE — Discharge Instructions (Signed)
  You have been seen in the Emergency Department (ED) for abdominal pain. Your workup was generally reassuring; however, it did not identify a clear cause of your symptoms.  I do suspect your symptoms can be related to gastritis and peptic ulcer disease. Start taking the medications as prescribed. Make an appointment with the GI doctor for further workup and management.  Please follow up with your primary care doctor as soon as possible regarding today's ED visit and the symptoms that are bothering you.  Return to the ED if your abdominal pain worsens or fails to improve, you develop bloody vomiting, bloody diarrhea, you are unable to tolerate fluids due to vomiting, fever greater than 101, or any other concerning symptoms.

## 2022-06-21 NOTE — ED Provider Notes (Signed)
[place] [facility]GENCY DEPARTMENT AT [facility] Provider Note   CSN: [id number] Arrival date & time: 06/21/22  1359     History  Chief Complaint  Patient presents with   Abdominal Pain    Vincent [name] is a 57 y.o. male.  With PMH of DM 2, HTN, RA presenting with worsening abdominal pain over the past 4 weeks.  Patient says he initially started having epigastrium pain worse after eating but that has progressed to constant severe sharp pains in his epigastrium, right upper quadrant and lower right quadrant radiating to his back region.  He has had associated nausea but no vomiting.  He has had nonbloody diarrhea.  No melena or bright red blood per rectum.  He has had no fevers, no chills, no coughing, no chest pain or shortness of breath.  Has history of cholecystectomy.  No other abdominal surgeries.  No pain with urination or change in urinary habits. Denies drinking or drug use.   Abdominal Pain      Home Medications Prior to Admission medications   Medication Sig Start Date End Date Taking? Authorizing Provider  dicyclomine (BENTYL) 20 MG tablet Take 1 tablet (20 mg total) by mouth 2 (two) times daily as needed (abdominal cramping). 06/21/22  Yes Mardene Sayer, MD  famotidine (PEPCID) 20 MG tablet Take 1 tablet (20 mg total) by mouth 2 (two) times daily. 06/21/22  Yes Mardene Sayer, MD  HYDROcodone-acetaminophen (NORCO/VICODIN) 5-325 MG tablet Take by mouth. 06/19/22  Yes [provider]  omeprazole (PRILOSEC) 20 MG capsule Take 1 capsule (20 mg total) by mouth daily. 06/21/22 07/21/22 Yes Mardene Sayer, MD  ondansetron (ZOFRAN-ODT) 4 MG disintegrating tablet Take 1 tablet (4 mg total) by mouth every 8 (eight) hours as needed. 06/21/22  Yes Mardene Sayer, MD  sucralfate (CARAFATE) 1 g tablet Take 1 g by mouth 2 (two) times daily. 06/12/22  Yes [provider]  acetaminophen (TYLENOL) 500 MG tablet Take 1,000 mg by mouth every 4 (four)  hours as needed for moderate pain or headache.    [provider]  clopidogrel (PLAVIX) 75 MG tablet Take 1 tablet (75 mg total) by mouth daily. Patient not taking: Reported on 05/24/2022 10/12/21   Glean Salvo, NP  DULoxetine (CYMBALTA) 60 MG capsule Take 1 capsule (60 mg total) by mouth daily. 10/12/21   Glean Salvo, NP  fenofibrate 160 MG tablet Take 160 mg by mouth at bedtime. 12/31/19   [provider]  HUMALOG KWIKPEN 200 UNIT/ML KwikPen Inject 10 Units into the skin in the morning, at noon, and at bedtime. 06/17/21   [provider]  hydroxychloroquine (PLAQUENIL) 200 MG tablet Take 200 mg by mouth daily. 04/27/22   [provider]  insulin glargine, 2 Unit Dial, (TOUJEO MAX SOLOSTAR) 300 UNIT/ML Solostar Pen Inject 100 Units into the skin daily. 05/27/22   Calvert Cantor, MD  lamoTRIgine (LAMICTAL) 150 MG tablet Take 1 tablet (150 mg total) by mouth 2 (two) times daily. 10/12/21   Glean Salvo, NP  losartan (COZAAR) 25 MG tablet Take 25 mg by mouth daily. 03/14/22   [provider]  lurasidone (LATUDA) 20 MG TABS tablet Take 20 mg by mouth at bedtime.    [provider]  lurasidone (LATUDA) 80 MG TABS tablet Take 1 tablet (80 mg total) by mouth daily with breakfast. Patient not taking: Reported on 05/24/2022 01/25/22   Arfeen, Phillips Grout, MD  omeprazole (PRILOSEC) 40 MG capsule  Take 40 mg by mouth daily. 08/23/21   [provider]  pregabalin (LYRICA) 200 MG capsule Take 200-400 mg by mouth in the morning and at bedtime. Take 200 mg by mouth in the morning and 400 mg by mouth at night 12/25/21   Dois Davenportichter, Karen L, MD  rosuvastatin (CRESTOR) 10 MG tablet Take 1 tablet (10 mg total) by mouth daily. Patient taking differently: Take 10 mg by mouth at bedtime. 08/04/21 05/24/22  Lauro FranklinPashayan, Alexander S, MD  tamsulosin (FLOMAX) 0.4 MG CAPS capsule Take 0.4 mg by mouth at bedtime.    [provider]  traZODone (DESYREL) 150 MG tablet Take 1  tablet (150 mg total) by mouth at bedtime as needed for sleep. Patient not taking: Reported on 05/24/2022 01/25/22   Cleotis NipperArfeen, Syed T, MD  venlafaxine XR (EFFEXOR-XR) 150 MG 24 hr capsule Take 1 capsule (150 mg total) by mouth daily with breakfast. 01/25/22   Arfeen, Phillips GroutSyed T, MD  Vitamin D, Ergocalciferol, (DRISDOL) 1.25 MG (50000 UNIT) CAPS capsule Take 50,000 Units by mouth every Wednesday. 12/26/19   [provider]      Allergies    Celecoxib, Hydrocodone, Sulfa antibiotics, and Sulfacetamide sodium    Review of Systems   Review of Systems  Gastrointestinal:  Positive for abdominal pain.    Physical Exam Updated Vital Signs BP (!) 180/86   Pulse 84   Temp 97.9 F (36.6 C)   Resp 18   Ht 6' (1.829 m)   Wt 83.9 kg   SpO2 98%   BMI 25.09 kg/m  Physical Exam Constitutional: Alert and oriented.  Nontoxic, no acute distress Eyes: Conjunctivae are normal. ENT      Head: Normocephalic and atraumatic. Cardiovascular: S1, S2, regular rate and rhythm Respiratory: Normal respiratory effort. Breath sounds are normal. Gastrointestinal: Abdomen soft, minimally distended, epigastrium tenderness, right upper quadrant tenderness, right lower quadrant tenderness and positive Rovsing sign.  No rebound or guarding.  Not peritonitic. Musculoskeletal: Normal range of motion in all extremities. No pitting edema of lower extremities Neurologic: Normal speech and language. No gross focal neurologic deficits are appreciated. Skin: Skin is warm, dry and intact. No rash noted. Psychiatric: Mood and affect are normal. Speech and behavior are normal.  ED Results / Procedures / Treatments   Labs (all labs ordered are listed, but only abnormal results are displayed) Labs Reviewed  LIPASE, BLOOD - Abnormal; Notable for the following components:      Result Value   Lipase 107 (*)    All other components within normal limits  COMPREHENSIVE METABOLIC PANEL - Abnormal; Notable for the following  components:   Glucose, Bld 333 (*)    BUN 29 (*)    AST 14 (*)    All other components within normal limits  CBC - Abnormal; Notable for the following components:   RBC 4.20 (*)    Hemoglobin 12.2 (*)    HCT 35.7 (*)    All other components within normal limits  URINALYSIS, ROUTINE W REFLEX MICROSCOPIC - Abnormal; Notable for the following components:   Color, Urine COLORLESS (*)    Specific Gravity, Urine <1.005 (*)    Hgb urine dipstick TRACE (*)    Protein, ur 30 (*)    All other components within normal limits    EKG None  Radiology CT ABDOMEN PELVIS W CONTRAST  Result Date: 06/21/2022 CLINICAL DATA:  Right upper epigastric and lower quadrant pain EXAM: CT ABDOMEN AND PELVIS WITH CONTRAST TECHNIQUE: Multidetector CT imaging of  the abdomen and pelvis was performed using the standard protocol following bolus administration of intravenous contrast. RADIATION DOSE REDUCTION: This exam was performed according to the departmental dose-optimization program which includes automated exposure control, adjustment of the mA and/or kV according to patient size and/or use of iterative reconstruction technique. CONTRAST:  61mL OMNIPAQUE IOHEXOL 300 MG/ML  SOLN COMPARISON:  CT 04/05/2022 FINDINGS: Lower chest: No acute abnormality. Hepatobiliary: No focal liver abnormality is seen. Status post cholecystectomy. No biliary dilatation. Pancreas: Unremarkable. No pancreatic ductal dilatation or surrounding inflammatory changes. Spleen: Normal in size without focal abnormality. Adrenals/Urinary Tract: Adrenal glands are unremarkable. Kidneys are normal, without renal calculi, focal lesion, or hydronephrosis. Bladder is slightly thick walled. Stomach/Bowel: Stomach is within normal limits. Appendix appears normal. No evidence of bowel wall thickening, distention, or inflammatory changes. Vascular/Lymphatic: Moderate aortic atherosclerosis. No aneurysm. No suspicious lymph nodes. Reproductive: Prostate is  unremarkable. Other: Negative for pelvic effusion or free air. Musculoskeletal: No acute or significant osseous findings. IMPRESSION: 1. No CT evidence for acute intra-abdominal or pelvic abnormality. 2. Slightly thick-walled appearance of the urinary bladder, correlate for cystitis. 3. Aortic atherosclerosis. Aortic Atherosclerosis (ICD10-I70.0). Electronically Signed   By: Jasmine Pang M.D.   On: 06/21/2022 16:38    Procedures Procedures    Medications Ordered in ED Medications  lactated ringers bolus 1,000 mL (0 mLs Intravenous Stopped 06/21/22 1806)  morphine (PF) 4 MG/ML injection 4 mg (4 mg Intravenous Given 06/21/22 1624)  ondansetron (ZOFRAN) injection 4 mg (4 mg Intravenous Given 06/21/22 1626)  pantoprazole (PROTONIX) injection 40 mg (40 mg Intravenous Given 06/21/22 1627)  iohexol (OMNIPAQUE) 300 MG/ML solution 100 mL (85 mLs Intravenous Contrast Given 06/21/22 1604)  alum & mag hydroxide-simeth (MAALOX/MYLANTA) 200-200-20 MG/5ML suspension 30 mL (30 mLs Oral Given 06/21/22 1710)    ED Course/ Medical Decision Making/ A&P Clinical Course as of 06/21/22 1945  Wed Jun 21, 2022  1938 Patient reassessed feeling improved.  Tolerating p.o. no vomiting.  Discussed reassuring workup with patient.  Suspect possible severe gastritis peptic ulcer disease.  Starting patient on omeprazole, Pepcid and Zofran and Maalox.  Will refer to GI.  Discussed strict return precautions.  He is in agreement with plan being discharged in stable condition. [VB]    Clinical Course User Index [VB] Mardene Sayer, MD                             Medical Decision Making Vincent [name] is a 57 y.o. male.  With PMH of DM 2, HTN, RA presenting with worsening abdominal pain over the past 4 weeks.   Based on patient's history and presentation, symptoms could be secondary to but not limited to gastritis, peptic ulcer disease, pancreatitis, colitis, appendicitis among multiple other etiologies.  Unlikely to be  biliary disease related as he has history of cholecystitis.  I am not concerned for pulmonary source as he has no increased work of breathing, normal breath sounds and no pulmonary complaints.  Labs reviewed by me with normal white blood cell count 6.6.  Hemoglobin 12.2 improved from baseline.  AST and ALT are not elevated.  Lipase slightly elevated at 107.  BUN up to 29.  Normal creatinine 1.23.  CTAP with contrast obtained which I personally reviewed.  No acute findings noted.  No acute pancreatitis.  No appendicitis, no colitis.  There was thick walled appearance of urinary bladder however UA not consistent with UTI.  Patient reassessed feeling  improved.  Tolerating p.o. no vomiting.  Discussed reassuring workup with patient.  Suspect possible severe gastritis peptic ulcer disease.  Starting patient on omeprazole, Pepcid and Zofran and bentyl.  Will refer to GI.  Discussed strict return precautions.  He is in agreement with plan being discharged in stable condition. [VB]   Amount and/or Complexity of Data Reviewed Radiology: ordered.  Risk OTC drugs. Prescription drug management.      Final Clinical Impression(s) / ED Diagnoses Final diagnoses:  Abdominal pain, unspecified abdominal location  Gastritis without bleeding, unspecified chronicity, unspecified gastritis type  Elevated lipase    Rx / DC Orders ED Discharge Orders          Ordered    Ambulatory referral to Gastroenterology        06/21/22 1941    omeprazole (PRILOSEC) 20 MG capsule  Daily        06/21/22 1941    famotidine (PEPCID) 20 MG tablet  2 times daily        06/21/22 1941    ondansetron (ZOFRAN-ODT) 4 MG disintegrating tablet  Every 8 hours PRN        06/21/22 1941    dicyclomine (BENTYL) 20 MG tablet  2 times daily PRN        06/21/22 1941              Mardene Sayer, MD 06/21/22 1946

## 2022-06-21 NOTE — ED Triage Notes (Signed)
Patient here POV from Home.  Endorses having Mid ABD Pain that radiates around to Right Flank that began 3 Weeks ago. Seen by PCP and sent for Assessment as they had Lab Specimens collected 2 Days ago which suggested "Pancreatitis".  Some nausea. No emesis. Loose Stools. No Fevers. Pain worsens with eating.  NAD Noted during Triage. A&Ox4. GCS 15. Ambulatory with Cane.

## 2022-06-21 NOTE — ED Notes (Signed)
Patient notified of need for urine sample to complete eval/assessment. Specimen cup provided and instructions for clean catch given. Patient will notify staff when able to provide

## 2022-06-22 ENCOUNTER — Ambulatory Visit: Payer: Medicare HMO | Admitting: Occupational Therapy

## 2022-06-22 ENCOUNTER — Other Ambulatory Visit: Payer: Medicare HMO

## 2022-06-22 ENCOUNTER — Ambulatory Visit: Payer: Medicare HMO | Admitting: Physical Therapy

## 2022-06-22 ENCOUNTER — Encounter: Payer: Self-pay | Admitting: Occupational Therapy

## 2022-06-22 VITALS — BP 138/64 | HR 84

## 2022-06-22 DIAGNOSIS — R2681 Unsteadiness on feet: Secondary | ICD-10-CM

## 2022-06-22 DIAGNOSIS — R208 Other disturbances of skin sensation: Secondary | ICD-10-CM

## 2022-06-22 DIAGNOSIS — Z9181 History of falling: Secondary | ICD-10-CM

## 2022-06-22 DIAGNOSIS — M6281 Muscle weakness (generalized): Secondary | ICD-10-CM

## 2022-06-22 DIAGNOSIS — R278 Other lack of coordination: Secondary | ICD-10-CM

## 2022-06-22 DIAGNOSIS — R2689 Other abnormalities of gait and mobility: Secondary | ICD-10-CM

## 2022-06-22 NOTE — Therapy (Signed)
OUTPATIENT PHYSICAL THERAPY NEURO TREATMENT   Patient Name: Vincent [name] MRN: [id number] DOB:Jul 04, 1965, 57 y.o., male Today's Date: 06/22/2022  PCP: Dois Davenport, MD REFERRING PROVIDER: Calvert Cantor, MD   END OF SESSION:  PT End of Session - 06/22/22 1251     Visit Number 6    Number of Visits 17    Date for PT Re-Evaluation 07/29/22    Authorization Type [employer]    PT Start Time 1108   shortened due to OT Eval runnig over   PT Stop Time 1146    PT Time Calculation (min) 38 min    Equipment Utilized During Treatment Gait belt    Activity Tolerance Patient tolerated treatment well    Behavior During Therapy [facility] for tasks assessed/performed              Past Medical History:  Diagnosis Date   Anxiety    Benign essential tremor    Bipolar 2 disorder    followed by [facility]--- dr s. Lolly Mustache   Bladder cancer    recurrent   CAD (coronary artery disease)    cardiac cath 2003  and 2011 both showed normal coronary arteries w/ preserved lvf;  Non obstructive on CTA Oct 2019.    Chronic pain syndrome    back---- [facility] [place]   Cold extremities    BLE   COPD (chronic obstructive pulmonary disease)    DDD (degenerative disc disease), lumbar    Diabetic peripheral neuropathy    Gastroparesis    followed by dr Marina Goodell   GERD (gastroesophageal reflux disease)    Hiatal hernia    History of bladder cancer urologist-  previously dr Ronal Fear;  now dr gay   papillay TCC (Ta G1)  s/p TURBT and chemo instillation 2014   History of chest pain 12/2017   heart cath normal   History of encephalopathy 05/27/2015   admission w/ acute encephalopathy thought to be secondary to pain meds and COPD   History of gastric ulcer    History of Helicobacter pylori infection    History of kidney stones    History of TIA (transient ischemic attack) 2008  and 10-19-2018    no residual's   History of traumatic head injury 2010   w/ LOC  per pt needed stitches, hit  in head with a mower blade   Hyperlipidemia    Hypertension    Hypogonadism male    s/p  bilateral orchiectomy   Hypothyroidism    Insomnia    Mild obstructive sleep apnea    study in epic 12-04-2016, no cpap   PTSD (post-traumatic stress disorder)    chronic   PTSD (post-traumatic stress disorder)    RA (rheumatoid arthritis)    followed by [facility]   Seizures, transient neurologist-  dr Terrace Arabia--  differential dx complex partial seizure .vs.  mood disorder .vs.  pseudoseizure--  negative EEG's   confusion episodes and staring spells since 11/ 2015   (03-26-2020 per pt wife last seizure 10 /2021)   Transient confusion NEUOROLOGIST-  DR Terrace Arabia   Episodes since 11/ 2015--  neurologist dx  differential complex partial seizure  .vs. mood disorder . vs. pseudoseizure   Type 2 diabetes mellitus treated with insulin    endocrinologist--- dr Everardo All---  (03-26-2020 pt does not check blood sugar at home)   Past Surgical History:  Procedure Laterality Date   AMPUTATION Left 04/28/2020   Procedure: LEFT LITTLE FINGER AMPUTATION;  Surgeon: Nadara Mustard, MD;  Location: [facility] OR;  Service: Orthopedics;  Laterality: Left;   CARDIAC CATHETERIZATION  12-27-2001  DR Jacinto Halim  &  05-26-2009  DR Eldridge Dace   RESULTS FOR BOTH ARE NORMAL CORONARIES AND PERSERVED LVF/ EF 60%   CARPAL TUNNEL RELEASE Bilateral right 09-16-2003;  left ?   CARPAL TUNNEL RELEASE Left 02/25/2015   Procedure: LEFT CARPAL TUNNEL RELEASE;  Surgeon: Betha Loa, MD;  Location: [facility];  Service: Orthopedics;  Laterality: Left;   CYSTOSCOPY N/A 10/10/2012   Procedure: CYSTOSCOPY CLOT EVACUATION FULGERATION OF BLEEDERS ;  Surgeon: Garnett Farm, MD;  Location: [facility];  Service: Urology;  Laterality: N/A;   CYSTOSCOPY WITH BIOPSY N/A 11/26/2015   Procedure: CYSTOSCOPY WITH BIOPSY AND FULGURATION;  Surgeon: Ihor Gully, MD;  Location[facility];  Service: Urology;  Laterality:  N/A;   ESOPHAGOGASTRODUODENOSCOPY  2014   LAPAROSCOPIC CHOLECYSTECTOMY  11-17-2010   ORCHIECTOMY Right 02/21/2016   Procedure: SCROTAL ORCHIECTOMY with TESTICULAR PROSTHESIS IMPLANT;  Surgeon: Ihor Gully, MD;  Location: [facility];  Service: Urology;  Laterality: Right;   ORCHIECTOMY Left 09/02/2018   Procedure: ORCHIECTOMY;  Surgeon: Ihor Gully, MD;  Location: [facility];  Service: Urology;  Laterality: Left;   ROTATOR CUFF REPAIR Right 12/2004   TRANSURETHRAL RESECTION OF BLADDER TUMOR N/A 08/09/2012   Procedure: TRANSURETHRAL RESECTION OF BLADDER TUMOR (TURBT) WITH GYRUS WITH MITOMYCIN C;  Surgeon: Garnett Farm, MD;  Location: [facility];  Service: Urology;  Laterality: N/A;   TRANSURETHRAL RESECTION OF BLADDER TUMOR N/A 03/29/2020   Procedure: TRANSURETHRAL RESECTION OF BLADDER TUMOR (TURBT) and post-op instillation of gemcitabine;  Surgeon: [name], MD;  Location: [facility];  Service: Urology;  Laterality: N/A;   TRANSURETHRAL RESECTION OF BLADDER TUMOR WITH GYRUS (TURBT-GYRUS) N/A 02/27/2014   Procedure: TRANSURETHRAL RESECTION OF BLADDER TUMOR WITH GYRUS (TURBT-GYRUS);  Surgeon: Garnett Farm, MD;  Location: [facility];  Service: Urology;  Laterality: N/A;   Patient Active Problem List   Diagnosis Date Noted   Peripheral neuropathy in hands 06/08/2022   Hyperosmolar hyperglycemic state (HHS) 05/24/2022   Essential hypertension 05/23/2022   BPH (benign prostatic hyperplasia) 05/23/2022   Diabetic hyperosmolar non-ketotic state 04/05/2022   AKI (acute kidney injury) 09/04/2021   Hypotension 09/03/2021   History of seizure 08/02/2021   Acute metabolic encephalopathy 07/28/2021   Suicide attempt by drug overdose 07/28/2021   Cerebral thrombosis with cerebral infarction 11/16/2020   Stroke-like symptoms 11/14/2020   Hyperkalemia 11/14/2020   Ischemic stroke 06/10/2020   Hyperglycemia     Gangrene of finger of left hand    Pain due to onychomycosis of toenails of both feet 01/28/2020   Hyperglycemia due to type 2 diabetes mellitus 01/07/2020   Resistance to insulin 01/07/2020   Right leg weakness 04/24/2019   Right leg pain 04/24/2019   Gait abnormality 04/24/2019   TIA (transient ischemic attack) 10/20/2018   DM2 (diabetes mellitus, type 2) 10/19/2018   Left sided numbness 10/19/2018   Diabetic autonomic neuropathy associated with type 2 diabetes mellitus 02/01/2018   DM type 2 with diabetic peripheral neuropathy 02/01/2018   Dyslipidemia 01/31/2018   Coronary artery disease involving native coronary artery of native heart without angina pectoris 01/31/2018   Chest pain 01/01/2018   Breakthrough seizure 12/19/2016   Essential tremor 12/19/2016   Chronic post-traumatic stress disorder (PTSD) 06/16/2016   Tremor 06/16/2016   Tobacco abuse  05/05/2016   Altered mental status 05/28/2015   Acute bronchitis 05/28/2015   Type 2 diabetes mellitus with hyperglycemia 05/28/2015   Hypothyroidism 05/28/2015   Bipolar 2 disorder 05/28/2015   History of rheumatoid arthritis 05/28/2015   Chronic pain 05/28/2015   Bipolar II disorder, most recent episode major depressive 03/18/2015   Carpal tunnel syndrome on left 02/10/2015   Carpal tunnel syndrome on right 02/10/2015   Diabetes 01/23/2015   Obesity (BMI 30-39.9) 03/06/2014   Acute encephalopathy 03/05/2014   Hyperlipidemia 03/05/2014   Anemia, normocytic normochromic 03/05/2014   Altered mental state    Helicobacter pylori (H. pylori) infection 11/20/2012   Protein-calorie malnutrition, severe 10/09/2012   Dysphagia 08/19/2012   Gastroparesis 08/19/2012   Bladder tumor 08/08/2012   Biliary dyskinesia 11/02/2010    ONSET DATE: 05/26/2022  REFERRING DIAG: R73.9 (ICD-10-CM) - Hyperglycemia R29.898 (ICD-10-CM) - Right leg weakness R20.0 (ICD-10-CM) - Left sided numbness  THERAPY DIAG:  Unsteadiness on feet  Muscle  weakness (generalized)  History of falling  Other abnormalities of gait and mobility  Other disturbances of skin sensation  Rationale for Evaluation and Treatment: Rehabilitation  SUBJECTIVE:                                                                                                                                                                                             SUBJECTIVE STATEMENT: Patient reports that he is doing alright. Patient also reports that he had a massive fall a few days ago. Patient reports that he was trying to step off a curb and fell and forward and then backward and landed on his back. Patient denies hitting his head. Patient reported that he had to crawl from where he fell to his car to get up. Patient reports that his glucose this morning was 139. Patient arrives to session with SPC.  Pt accompanied by: self  PERTINENT HISTORY: PMH: HTN, diabetes mellitus type 2 who is noncompliant with medication, HTN, HLD, Acute Kidney Injury, Hypokalemia, bipolar disorder, rheumatoid arthritis, diabetic peripheral neuropathy, hx of TIAs, tobacco abuse, hx of CVA (pt reports it affected his L side)  Per Dr. Riley Kill note in hospital: Pt with poorly controlled diabetes who presented on 05/23/22 weakness, malaise, and diarrhea. He was found to have a CBG (Capillary blood glucose) of 1184 and a Na+ of 121. Hgb A1C was 14.1! He was placed on IVF fluids and insulin and sugars are better controlled but sill remain in the 300's. Pt c/o numbness in hands and distal lower extremities as well as ongoing weakness, left > right. The weakness is not all entirely new as he has  been experiencing symptoms for several months apparently. Prior to arrival he ambulating with a walker and could perform ADL's   PAIN:  Are you having pain? Yes: NPRS scale: 4 (neuropathy pain)/10 Pain location: Lower legs, arms Pain description: Constant, sharp  Aggravating factors: Nothing, stays the same unless  getting exerted  Relieving factors: Not really   Vitals:   06/22/22 1115  BP: 138/64  Pulse: 84    PRECAUTIONS: Fall  WEIGHT BEARING RESTRICTIONS: No  FALLS: Has patient fallen in last 6 months? Yes. Number of falls 8, pt reports he has banged himself pretty good with these   LIVING ENVIRONMENT: Lives with:  girlfriend lives there 3x a week  Lives in: Mobile home Stairs: Yes: External: 6 steps; bilateral but cannot reach both Has following equipment at home: Single point cane, Walker - 2 wheeled, Environmental consultant - 4 wheeled, and shower chair  PLOF: Independent, Vocation/Vocational requirements: On disability (has been for years), and Leisure: going to [employer] races  pt reports he is still driving, Pt's girlfriend helps pt get dressed, take a shower, cooking him dinner, chores   PATIENT GOALS: Wants to get legs stronger, get better at feeding himself and feeling in hands   OBJECTIVE:   DIAGNOSTIC FINDINGS: CT head 05/23/22: IMPRESSION: No acute intracranial findings.  No CT evidence of acute infarction.    COGNITION: Overall cognitive status: Within functional limits for tasks assessed   TODAY'S TREATMENT:                                                                                                                               TherAct:  Fall recovery part practice Quadruped with cross body taps onto bench 2 x 10 bil (CGA) Tall kneel to half kneel with bilateral UE support x 3, x 2, x 1 with breaks between (CGA) Practiced taking theraband off technique with sitting on EOM. Required minA to sit up due to UE fatigue at end of session.   TherEx: 2" step up/down fwd, bckwd, laterally x 10 with bilateral UE support in // bars (CGA)  - required minA to sit due to fatigue on final rep on lateral step up   *Seated rest breaks incorporated between  PATIENT EDUCATION: Education details: education on safety and use of walker for safety, Continue HEP Person educated: Patient Education  method: Medical illustrator Education comprehension: verbalized understanding and returned demonstration  HOME EXERCISE PROGRAM: Access Code: [email or web address] URL: [email or web address] Date: 06/15/2022 Prepared by: Maryruth Eve  Exercises - Clamshell  - 1 x daily - 7 x weekly - 2 sets - 10-20 reps - Sitting Knee Extension with Resistance  - 1 x daily - 7 x weekly - 2 sets - 10 reps - Supine Bridge with Resistance Band  - 1 x daily - 7 x weekly - 3 sets - 12 reps - Hooklying Single Leg Bent Knee Fallouts with Resistance  - 1 x daily - 7 x weekly -  3 sets - 12 reps - Supine March with Resistance Band  - 1 x daily - 7 x weekly - 2 sets - 20 reps  GOALS: Goals reviewed with patient? Yes  SHORT TERM GOALS: Target date: 06/27/2022  Pt will be independent with initial HEP in order to build upon functional gains made in therapy. Baseline: Goal status: INITIAL  2.  Pt will improve BERG to at least a 18/56 in order to demo decr fall risk.  Baseline: 11/56 Goal status: INITIAL  3.  Pt will verbalize understanding of fall prevention in the home.  Baseline:  Goal status: INITIAL  4.  Pt will improve gait speed with RW vs. LRAD to at least 1.3 ft/sec in order to demo decr fall risk.  Baseline:  33.34 seconds with RW = .98 ft/sec  Goal status: INITIAL  5.  Pt will improve 5x sit<>stand to less than or equal to 44 sec with BUE support to demonstrate improved functional strength and transfer efficiency.   Baseline:  53.62 seconds with BUE support, unable to come fully to stand during last 2 reps, pt very fatigued afterwards   Goal status: INITIAL    LONG TERM GOALS: Target date: 07/25/2022  Pt will be independent with final HEP in order to build upon functional gains made in therapy. Baseline:  Goal status: INITIAL  2.  Pt will improve BERG to at least a 28/56 in order to demo decr fall risk. Baseline: 11/56 Goal status: INITIAL  3.  Pt will go up and down 6 steps  with step to pattern and single handrail with mod I in order to safely get in and out of his home.  Baseline: pt reports having a fall down his steps.  Goal status: INITIAL  4.   Pt will improve 5x sit<>stand to less than or equal to 30 sec with BUE support to demonstrate improved functional strength and transfer efficiency.  Baseline: 53.62 seconds with BUE support, unable to come fully to stand during last 2 reps, pt very fatigued afterwards   Goal status: INITIAL  5.    Pt will improve gait speed with RW vs. LRAD to at least 2.0 ft/sec in order to demo improved community mobility.  Baseline: 33.34 seconds with RW = .98 ft/sec  Goal status: INITIAL    ASSESSMENT:  CLINICAL IMPRESSION: Session emphasized partial training of fall recovery tactics in addition to proximal strengthening. Patient fatigues quickly during session and requires multiple breaks for safety. One instance of L knee buckle with step up trial but patient with minA form therapist and use of UE support on // bars able to correct. Patient will benefit from continued skilled physical therapy services to address functional needs and improve safety in home.   OBJECTIVE IMPAIRMENTS: Abnormal gait, decreased activity tolerance, decreased balance, decreased coordination, decreased endurance, decreased knowledge of condition, decreased knowledge of use of DME, decreased mobility, difficulty walking, decreased ROM, decreased strength, decreased safety awareness, impaired flexibility, impaired sensation, impaired UE functional use, postural dysfunction, and pain.   ACTIVITY LIMITATIONS: carrying, lifting, standing, stairs, transfers, bathing, dressing, self feeding, hygiene/grooming, and locomotion level  PARTICIPATION LIMITATIONS: meal prep, cleaning, laundry, shopping, and community activity  PERSONAL FACTORS: Age, Behavior pattern, Past/current experiences, Time since onset of injury/illness/exacerbation, and 3+ comorbidities:  HTN, diabetes mellitus type 2 who is noncompliant with medication, HTN, HLD, Acute Kidney Injury, Hypokalemia, bipolar disorder, rheumatoid arthritis, diabetic peripheral neuropathy, hx of TIAs, tobacco abuse, hx of CVA (pt reports it affected his  L side)  are also affecting patient's functional outcome.   REHAB POTENTIAL: Good  CLINICAL DECISION MAKING: Evolving/moderate complexity  EVALUATION COMPLEXITY: Moderate  PLAN:  PT FREQUENCY: 2x/week  PT DURATION: 8 weeks  PLANNED INTERVENTIONS: Therapeutic exercises, Therapeutic activity, Neuromuscular re-education, Balance training, Gait training, Patient/Family education, Self Care, Stair training, Vestibular training, Visual/preceptual remediation/compensation, Orthotic/Fit training, DME instructions, Manual therapy, and Re-evaluation  PLAN FOR NEXT SESSION: did pt get RW yet? strength, balance as able, proximal stability/dynamic stepping reaction (tolerate tall kneeling or quadruped for strengthening?), standing tolerance, stepping reaction with UE support, type of bracing to prevent knee buckling, check STG goals, AFO trial, C brace candidate?, update HEP  Carmelia Bake, PT, DPT  06/22/2022, 12:54 PM

## 2022-06-22 NOTE — Therapy (Signed)
OUTPATIENT OCCUPATIONAL THERAPY NEURO EVALUATION  Patient Name: Vincent [name] MRN: [id number] DOB:1965/10/28, 57 y.o., male Today's Date: 06/22/2022  PCP: Dois Davenport, MD REFERRING PROVIDER: Dois Davenport, MD  END OF SESSION:  OT End of Session - 06/22/22 1022     Visit Number 1    Authorization Type [employer]    OT Start Time 1023    Activity Tolerance Patient tolerated treatment well    Behavior During Therapy [facility] for tasks assessed/performed             Past Medical History:  Diagnosis Date   Anxiety    Benign essential tremor    Bipolar 2 disorder    followed by [facility]--- dr s. Lolly Mustache   Bladder cancer    recurrent   CAD (coronary artery disease)    cardiac cath 2003  and 2011 both showed normal coronary arteries w/ preserved lvf;  Non obstructive on CTA Oct 2019.    Chronic pain syndrome    back---- [facility] [place]   Cold extremities    BLE   COPD (chronic obstructive pulmonary disease)    DDD (degenerative disc disease), lumbar    Diabetic peripheral neuropathy    Gastroparesis    followed by dr Marina Goodell   GERD (gastroesophageal reflux disease)    Hiatal hernia    History of bladder cancer urologist-  previously dr Ronal Fear;  now dr gay   papillay TCC (Ta G1)  s/p TURBT and chemo instillation 2014   History of chest pain 12/2017   heart cath normal   History of encephalopathy 05/27/2015   admission w/ acute encephalopathy thought to be secondary to pain meds and COPD   History of gastric ulcer    History of Helicobacter pylori infection    History of kidney stones    History of TIA (transient ischemic attack) 2008  and 10-19-2018    no residual's   History of traumatic head injury 2010   w/ LOC  per pt needed stitches, hit in head with a mower blade   Hyperlipidemia    Hypertension    Hypogonadism male    s/p  bilateral orchiectomy   Hypothyroidism    Insomnia    Mild obstructive sleep apnea    study in epic  12-04-2016, no cpap   PTSD (post-traumatic stress disorder)    chronic   PTSD (post-traumatic stress disorder)    RA (rheumatoid arthritis)    followed by [facility]   Seizures, transient neurologist-  dr Terrace Arabia--  differential dx complex partial seizure .vs.  mood disorder .vs.  pseudoseizure--  negative EEG's   confusion episodes and staring spells since 11/ 2015   (03-26-2020 per pt wife last seizure 10 /2021)   Transient confusion NEUOROLOGIST-  DR Terrace Arabia   Episodes since 11/ 2015--  neurologist dx  differential complex partial seizure  .vs. mood disorder . vs. pseudoseizure   Type 2 diabetes mellitus treated with insulin    endocrinologist--- dr Everardo All---  (03-26-2020 pt does not check blood sugar at home)   Past Surgical History:  Procedure Laterality Date   AMPUTATION Left 04/28/2020   Procedure: LEFT LITTLE FINGER AMPUTATION;  Surgeon: Nadara Mustard, MD;  Location: [facility] OR;  Service: Orthopedics;  Laterality: Left;   CARDIAC CATHETERIZATION  12-27-2001  DR Jacinto Halim  &  05-26-2009  DR Eldridge Dace   RESULTS FOR BOTH ARE NORMAL CORONARIES AND PERSERVED LVF/ EF 60%   CARPAL TUNNEL RELEASE Bilateral  right 09-16-2003;  left ?   CARPAL TUNNEL RELEASE Left 02/25/2015   Procedure: LEFT CARPAL TUNNEL RELEASE;  Surgeon: Betha Loa, MD;  Location: [facility];  Service: Orthopedics;  Laterality: Left;   CYSTOSCOPY N/A 10/10/2012   Procedure: CYSTOSCOPY CLOT EVACUATION FULGERATION OF BLEEDERS ;  Surgeon: Garnett Farm, MD;  Location: [facility];  Service: Urology;  Laterality: N/A;   CYSTOSCOPY WITH BIOPSY N/A 11/26/2015   Procedure: CYSTOSCOPY WITH BIOPSY AND FULGURATION;  Surgeon: Ihor Gully, MD;  Location[facility];  Service: Urology;  Laterality: N/A;   ESOPHAGOGASTRODUODENOSCOPY  2014   LAPAROSCOPIC CHOLECYSTECTOMY  11-17-2010   ORCHIECTOMY Right 02/21/2016   Procedure: SCROTAL ORCHIECTOMY with TESTICULAR PROSTHESIS IMPLANT;  Surgeon:  Ihor Gully, MD;  Location: [facility];  Service: Urology;  Laterality: Right;   ORCHIECTOMY Left 09/02/2018   Procedure: ORCHIECTOMY;  Surgeon: Ihor Gully, MD;  Location: [facility];  Service: Urology;  Laterality: Left;   ROTATOR CUFF REPAIR Right 12/2004   TRANSURETHRAL RESECTION OF BLADDER TUMOR N/A 08/09/2012   Procedure: TRANSURETHRAL RESECTION OF BLADDER TUMOR (TURBT) WITH GYRUS WITH MITOMYCIN C;  Surgeon: Garnett Farm, MD;  Location: [facility];  Service: Urology;  Laterality: N/A;   TRANSURETHRAL RESECTION OF BLADDER TUMOR N/A 03/29/2020   Procedure: TRANSURETHRAL RESECTION OF BLADDER TUMOR (TURBT) and post-op instillation of gemcitabine;  Surgeon: [name], MD;  Location: [facility];  Service: Urology;  Laterality: N/A;   TRANSURETHRAL RESECTION OF BLADDER TUMOR WITH GYRUS (TURBT-GYRUS) N/A 02/27/2014   Procedure: TRANSURETHRAL RESECTION OF BLADDER TUMOR WITH GYRUS (TURBT-GYRUS);  Surgeon: Garnett Farm, MD;  Location: [facility];  Service: Urology;  Laterality: N/A;   Patient Active Problem List   Diagnosis Date Noted   Peripheral neuropathy in hands 06/08/2022   Hyperosmolar hyperglycemic state (HHS) 05/24/2022   Essential hypertension 05/23/2022   BPH (benign prostatic hyperplasia) 05/23/2022   Diabetic hyperosmolar non-ketotic state 04/05/2022   AKI (acute kidney injury) 09/04/2021   Hypotension 09/03/2021   History of seizure 08/02/2021   Acute metabolic encephalopathy 07/28/2021   Suicide attempt by drug overdose 07/28/2021   Cerebral thrombosis with cerebral infarction 11/16/2020   Stroke-like symptoms 11/14/2020   Hyperkalemia 11/14/2020   Ischemic stroke 06/10/2020   Hyperglycemia    Gangrene of finger of left hand    Pain due to onychomycosis of toenails of both feet 01/28/2020   Hyperglycemia due to type 2 diabetes mellitus 01/07/2020   Resistance to insulin 01/07/2020    Right leg weakness 04/24/2019   Right leg pain 04/24/2019   Gait abnormality 04/24/2019   TIA (transient ischemic attack) 10/20/2018   DM2 (diabetes mellitus, type 2) 10/19/2018   Left sided numbness 10/19/2018   Diabetic autonomic neuropathy associated with type 2 diabetes mellitus 02/01/2018   DM type 2 with diabetic peripheral neuropathy 02/01/2018   Dyslipidemia 01/31/2018   Coronary artery disease involving native coronary artery of native heart without angina pectoris 01/31/2018   Chest pain 01/01/2018   Breakthrough seizure 12/19/2016   Essential tremor 12/19/2016   Chronic post-traumatic stress disorder (PTSD) 06/16/2016   Tremor 06/16/2016   Tobacco abuse 05/05/2016   Altered mental status 05/28/2015   Acute bronchitis 05/28/2015   Type 2 diabetes mellitus with hyperglycemia 05/28/2015   Hypothyroidism 05/28/2015   Bipolar 2 disorder 05/28/2015   History of rheumatoid arthritis 05/28/2015   Chronic pain 05/28/2015   Bipolar II disorder, most recent  episode major depressive 03/18/2015   Carpal tunnel syndrome on left 02/10/2015   Carpal tunnel syndrome on right 02/10/2015   Diabetes 01/23/2015   Obesity (BMI 30-39.9) 03/06/2014   Acute encephalopathy 03/05/2014   Hyperlipidemia 03/05/2014   Anemia, normocytic normochromic 03/05/2014   Altered mental state    Helicobacter pylori (H. pylori) infection 11/20/2012   Protein-calorie malnutrition, severe 10/09/2012   Dysphagia 08/19/2012   Gastroparesis 08/19/2012   Bladder tumor 08/08/2012   Biliary dyskinesia 11/02/2010    ONSET DATE: 06/06/2022 (date of referral)  REFERRING DIAG: G62.9 (ICD-10-CM) - Neuropathy  THERAPY DIAG:  Muscle weakness (generalized)  History of falling  Other disturbances of skin sensation  Other lack of coordination  Rationale for Evaluation and Treatment: Habilitation  SUBJECTIVE:   SUBJECTIVE STATEMENT: Pt states his hands are extremely limiting his occupational performance due  to his lack of sensation.   Pt accompanied by: self  PERTINENT HISTORY: Per Dr. Riley Kill note in hospital: "Pt with poorly controlled diabetes who presented on 05/23/22 weakness, malaise, and diarrhea. He was found to have a CBG (Capillary blood glucose) of 1184 and a Na+ of 121. Hgb A1C was 14.1! He was placed on IVF fluids and insulin and sugars are better controlled but sill remain in the 300's. Pt c/o numbness in hands and distal lower extremities as well as ongoing weakness, left > right. The weakness is not all entirely new as he has been experiencing symptoms for several months apparently. Prior to arrival he ambulating with a walker and could perform ADL's"   PRECAUTIONS: Fall  WEIGHT BEARING RESTRICTIONS: No  PAIN:  Are you having pain? Yes: NPRS scale: 7/10 Pain location: legs from the knees down and arms from shoulders down Pain description: numb, shocking nerve pain Aggravating factors: weather; use Relieving factors: rest, medications, heating pad (legs)  FALLS: Has patient fallen in last 6 months? Yes. 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; driving  PATIENT GOALS: Being able to work in yard to hold garden tools and grilling ribs  OBJECTIVE:   HAND DOMINANCE: Left - reports he is using R hand more due to neuropathy  ADLs: Overall ADLs: mod I  IADLs: Generally mod I but has family to come in and help as needed Handwriting: 25% legible  MOBILITY STATUS: Independent  ACTIVITY TOLERANCE: Activity tolerance: fair  FUNCTIONAL OUTCOME MEASURES: Quick Dash: 68.2% disability   UPPER EXTREMITY ROM:     AROM Right (eval) Left (eval)  Shoulder flexion WNL WNL  Shoulder abduction WNL WNL  Elbow  flexion WNL WNL  Elbow extension WNL WNL  Wrist flexion WNL WNL  Wrist extension WNL WNL  Wrist pronation WNL WNL  Wrist supination WNL WNL  Digit Composite Flexion [facility] WFL  Digit Composite Extension WNL WNL  Digit Opposition WFL WFL  (Blank rows = not tested)  IR B WFL but limited to pain; L pinky finger amputated at MCP  UPPER EXTREMITY MMT:     MMT Right (eval) Left (eval)  Shoulder flexion [facility]  Shoulder abduction [facility]  Elbow flexion [facility] WFL  Elbow extension WFL WFL  (Blank rows = not tested)  HAND FUNCTION: Grip strength: Right: 44 lbs; Left: 24.4 lbs  COORDINATION: 9 Hole  Peg test: Right: 114 sec; Left: placed 5 pegs in 180 sec Tremors: action, Right, and Left Holds LUE abducted from body with 9 hole completion; L tremor >R  SENSATION: Reports he is unable to fell things though able to detect light touch with exception to medial aspect of L pinky finger  EDEMA: mild swelling reported and observed   MUSCLE TONE: WFL  COGNITION: Overall cognitive status: Within functional limits for tasks assessed  VISION: Subjective report: Got glasses in January to drive at night; diplopia when "sugars are high" Baseline vision: Wears glasses for distance only and Wears glasses for reading only  PERCEPTION: WFL  PRAXIS: WFL  OBSERVATIONS: Pt ambulates from lobby to treatment room with use of cane. No LOB noted, but pt is unsteady with altered gait. Pt appears well-kept.    TODAY'S TREATMENT:                                                                                                                               N/a this date  PATIENT EDUCATION: Education details: OT role and POC Person educated: Patient Education method: Explanation Education comprehension: verbalized understanding  HOME EXERCISE PROGRAM: N/a this date   GOALS:  SHORT TERM GOALS: Target date: 07/21/2022    Patient will demonstrate independence with initial BUE  HEP.  Baseline: Goal status: INITIAL  2.  Patient will complete nine-hole peg with use of R in 60 seconds or less.  Baseline: 114 seconds Goal status: INITIAL  3.  Pt will independently recall at least 2 tremor reduction strategies.  Baseline: unable Goal status: INITIAL   LONG TERM GOALS: Target date: 09/01/2022    Patient will demonstrate updated BUE HEP with 25% verbal cues or less for proper execution.  Baseline:  Goal status: INITIAL  2.  Patient will demonstrate at least 16% improvement with quick Dash score (reporting 52.2% disability or less) indicating improved functional use of affected extremity.  Baseline: 68.2% disability with BUE Goal status: INITIAL  3.  Pt will demonstrate at least 75% legibility with handwriting  Baseline: 25% legibility Goal status: INITIAL  4.  Patient will demonstrate at least 34 lbs L grip strength as needed to open jars and other containers.  Baseline: Left: 24.4 lbs Goal status: INITIAL  5.  Patient will complete nine-hole peg with use of L in 180 seconds or less.  Baseline: unable to complete, placed 5 pegs in 180 seconds Goal status: INITIAL  ASSESSMENT:  CLINICAL IMPRESSION: Patient is a 57 y.o. male who was seen today for occupational therapy evaluation for neuropathy. Hx includes HTN, diabetes mellitus type 2 who is noncompliant with medication, HTN, HLD, Acute Kidney Injury, Hypokalemia, bipolar disorder, rheumatoid arthritis, diabetic peripheral neuropathy, hx of TIAs, tobacco abuse, hx of CVA (pt reports it affected his L side) . Patient currently presents below baseline level of functioning demonstrating functional deficits and impairments as noted below. Pt would benefit from skilled OT services in  the outpatient setting to work on impairments as noted below to help pt return to PLOF as able.    PERFORMANCE DEFICITS: in functional skills including ADLs, IADLs, coordination, sensation, ROM, strength, pain, Fine motor  control, mobility, balance, body mechanics, decreased knowledge of use of DME, and UE functional use  IMPAIRMENTS: are limiting patient from ADLs, IADLs, rest and sleep, leisure, and social participation.   CO-MORBIDITIES: may have co-morbidities  that affects occupational performance. Patient will benefit from skilled OT to address above impairments and improve overall function.  MODIFICATION OR ASSISTANCE TO COMPLETE EVALUATION: Min-Moderate modification of tasks or assist with assess necessary to complete an evaluation.  OT OCCUPATIONAL PROFILE AND HISTORY: Problem focused assessment: Including review of records relating to presenting problem.  CLINICAL DECISION MAKING: LOW - limited treatment options, no task modification necessary  REHAB POTENTIAL: Fair given neuropathy  EVALUATION COMPLEXITY: Low    PLAN:  OT FREQUENCY: 2x/week  OT DURATION: 8 weeks  PLANNED INTERVENTIONS: self care/ADL training, therapeutic exercise, therapeutic activity, neuromuscular re-education, manual therapy, passive range of motion, functional mobility training, splinting, electrical stimulation, ultrasound, paraffin, fluidotherapy, moist heat, cryotherapy, contrast bath, patient/family education, DME and/or AE instructions, and Re-evaluation  RECOMMENDED OTHER SERVICES: none at this time  CONSULTED AND AGREED WITH PLAN OF CARE: Patient  PLAN FOR NEXT SESSION: initiate B coordination and red putty HEPs   Delana MeyerGeny M Ima Hafner, OT 06/22/2022, 10:24 AM

## 2022-06-26 ENCOUNTER — Encounter: Payer: Self-pay | Admitting: Physical Therapy

## 2022-06-26 ENCOUNTER — Ambulatory Visit: Payer: Medicare HMO | Admitting: Rehabilitative and Restorative Service Providers"

## 2022-06-26 ENCOUNTER — Encounter: Payer: Self-pay | Admitting: Rehabilitative and Restorative Service Providers"

## 2022-06-26 ENCOUNTER — Ambulatory Visit: Payer: Medicare HMO | Admitting: Physical Therapy

## 2022-06-26 VITALS — BP 98/52 | HR 87

## 2022-06-26 DIAGNOSIS — R208 Other disturbances of skin sensation: Secondary | ICD-10-CM

## 2022-06-26 DIAGNOSIS — R2681 Unsteadiness on feet: Secondary | ICD-10-CM

## 2022-06-26 DIAGNOSIS — R2689 Other abnormalities of gait and mobility: Secondary | ICD-10-CM

## 2022-06-26 DIAGNOSIS — M6281 Muscle weakness (generalized): Secondary | ICD-10-CM

## 2022-06-26 DIAGNOSIS — R278 Other lack of coordination: Secondary | ICD-10-CM

## 2022-06-26 DIAGNOSIS — Z9181 History of falling: Secondary | ICD-10-CM

## 2022-06-26 NOTE — Therapy (Signed)
OUTPATIENT PHYSICAL THERAPY NEURO TREATMENT   Patient Name: AVERILL GILILLAND MRN: [id number] DOB:[birth year], 57 y.o., male Today's Date: 06/26/2022  PCP: Dois Davenport, MD REFERRING PROVIDER: Calvert Cantor, MD   END OF SESSION:  PT End of Session - 06/26/22 1105     Visit Number 7    Number of Visits 17    Date for PT Re-Evaluation 07/29/22    Authorization Type [employer]    PT Start Time 1102    PT Stop Time 1142    PT Time Calculation (min) 40 min    Equipment Utilized During Treatment Gait belt    Activity Tolerance Patient tolerated treatment well    Behavior During Therapy [facility] for tasks assessed/performed              Past Medical History:  Diagnosis Date   Anxiety    Benign essential tremor    Bipolar 2 disorder    followed by [facility]--- dr s. Lolly Mustache   Bladder cancer    recurrent   CAD (coronary artery disease)    cardiac cath 2003  and 2011 both showed normal coronary arteries w/ preserved lvf;  Non obstructive on CTA Oct 2019.    Chronic pain syndrome    back---- [facility] [place]   Cold extremities    BLE   COPD (chronic obstructive pulmonary disease)    DDD (degenerative disc disease), lumbar    Diabetic peripheral neuropathy    Gastroparesis    followed by dr Marina Goodell   GERD (gastroesophageal reflux disease)    Hiatal hernia    History of bladder cancer urologist-  previously dr Ronal Fear;  now dr gay   papillay TCC (Ta G1)  s/p TURBT and chemo instillation 2014   History of chest pain 12/2017   heart cath normal   History of encephalopathy 05/27/2015   admission w/ acute encephalopathy thought to be secondary to pain meds and COPD   History of gastric ulcer    History of Helicobacter pylori infection    History of kidney stones    History of TIA (transient ischemic attack) 2008  and 10-19-2018    no residual's   History of traumatic head injury 2010   w/ LOC  per pt needed stitches, hit in head with a mower blade    Hyperlipidemia    Hypertension    Hypogonadism male    s/p  bilateral orchiectomy   Hypothyroidism    Insomnia    Mild obstructive sleep apnea    study in epic 12-04-2016, no cpap   PTSD (post-traumatic stress disorder)    chronic   PTSD (post-traumatic stress disorder)    RA (rheumatoid arthritis)    followed by [facility]   Seizures, transient neurologist-  dr Terrace Arabia--  differential dx complex partial seizure .vs.  mood disorder .vs.  pseudoseizure--  negative EEG's   confusion episodes and staring spells since 11/ 2015   (03-26-2020 per pt wife last seizure 10 /2021)   Transient confusion NEUOROLOGIST-  DR Terrace Arabia   Episodes since 11/ 2015--  neurologist dx  differential complex partial seizure  .vs. mood disorder . vs. pseudoseizure   Type 2 diabetes mellitus treated with insulin    endocrinologist--- dr Everardo All---  (03-26-2020 pt does not check blood sugar at home)   Past Surgical History:  Procedure Laterality Date   AMPUTATION Left 04/28/2020   Procedure: LEFT LITTLE FINGER AMPUTATION;  Surgeon: Nadara Mustard, MD;  Location: [facility]  OR;  Service: Orthopedics;  Laterality: Left;   CARDIAC CATHETERIZATION  12-27-2001  DR Einar Gip  &  05-26-2009  DR Irish Lack   RESULTS FOR BOTH ARE NORMAL CORONARIES AND PERSERVED LVF/ EF 60%   CARPAL TUNNEL RELEASE Bilateral right 09-16-2003;  left ?   CARPAL TUNNEL RELEASE Left 02/25/2015   Procedure: LEFT CARPAL TUNNEL RELEASE;  Surgeon: Leanora Cover, MD;  Location: [place];  Service: Orthopedics;  Laterality: Left;   CYSTOSCOPY N/A 10/10/2012   Procedure: CYSTOSCOPY CLOT EVACUATION FULGERATION OF BLEEDERS ;  Surgeon: Claybon Jabs, MD;  Location: [facility];  Service: Urology;  Laterality: N/A;   CYSTOSCOPY WITH BIOPSY N/A 11/26/2015   Procedure: CYSTOSCOPY WITH BIOPSY AND FULGURATION;  Surgeon: Kathie Rhodes, MD;  Location: [place];  Service: Urology;  Laterality: N/A;    ESOPHAGOGASTRODUODENOSCOPY  2014   LAPAROSCOPIC CHOLECYSTECTOMY  11-17-2010   ORCHIECTOMY Right 02/21/2016   Procedure: SCROTAL ORCHIECTOMY with TESTICULAR PROSTHESIS IMPLANT;  Surgeon: Kathie Rhodes, MD;  Location: [facility];  Service: Urology;  Laterality: Right;   ORCHIECTOMY Left 09/02/2018   Procedure: ORCHIECTOMY;  Surgeon: Kathie Rhodes, MD;  Location[facility];  Service: Urology;  Laterality: Left;   ROTATOR CUFF REPAIR Right 12/2004   TRANSURETHRAL RESECTION OF BLADDER TUMOR N/A 08/09/2012   Procedure: TRANSURETHRAL RESECTION OF BLADDER TUMOR (TURBT) WITH GYRUS WITH MITOMYCIN C;  Surgeon: Claybon Jabs, MD;  Location: [facility];  Service: Urology;  Laterality: N/A;   TRANSURETHRAL RESECTION OF BLADDER TUMOR N/A 03/29/2020   Procedure: TRANSURETHRAL RESECTION OF BLADDER TUMOR (TURBT) and post-op instillation of gemcitabine;  Surgeon: Janith Lima, MD;  Location: [facility];  Service: Urology;  Laterality: N/A;   TRANSURETHRAL RESECTION OF BLADDER TUMOR WITH GYRUS (TURBT-GYRUS) N/A 02/27/2014   Procedure: TRANSURETHRAL RESECTION OF BLADDER TUMOR WITH GYRUS (TURBT-GYRUS);  Surgeon: Claybon Jabs, MD;  Location: [facility];  Service: Urology;  Laterality: N/A;   Patient Active Problem List   Diagnosis Date Noted   Peripheral neuropathy in hands 06/08/2022   Hyperosmolar hyperglycemic state (HHS) 05/24/2022   Essential hypertension 05/23/2022   BPH (benign prostatic hyperplasia) 05/23/2022   Diabetic hyperosmolar non-ketotic state 04/05/2022   AKI (acute kidney injury) 09/04/2021   Hypotension 09/03/2021   History of seizure [id number]   Acute metabolic encephalopathy [id number]   Suicide attempt by drug overdose 07/28/2021   Cerebral thrombosis with cerebral infarction 11/16/2020   Stroke-like symptoms 11/14/2020   Hyperkalemia 11/14/2020   Ischemic stroke 06/10/2020   Hyperglycemia    Gangrene of  finger of left hand    Pain due to onychomycosis of toenails of both feet 01/28/2020   Hyperglycemia due to type 2 diabetes mellitus 01/07/2020   Resistance to insulin 01/07/2020   Right leg weakness 04/24/2019   Right leg pain 04/24/2019   Gait abnormality 04/24/2019   TIA (transient ischemic attack) 10/20/2018   DM2 (diabetes mellitus, type 2) 10/19/2018   Left sided numbness 10/19/2018   Diabetic autonomic neuropathy associated with type 2 diabetes mellitus 02/01/2018   DM type 2 with diabetic peripheral neuropathy 02/01/2018   Dyslipidemia 01/31/2018   Coronary artery disease involving native coronary artery of native heart without angina pectoris 01/31/2018   Chest pain 01/01/2018   Breakthrough seizure 12/19/2016   Essential tremor 12/19/2016   Chronic post-traumatic stress disorder (PTSD) 06/16/2016   Tremor 06/16/2016   Tobacco abuse 05/05/2016   Altered mental status 05/28/2015  Acute bronchitis 05/28/2015   Type 2 diabetes mellitus with hyperglycemia 05/28/2015   Hypothyroidism 05/28/2015   Bipolar 2 disorder 05/28/2015   History of rheumatoid arthritis 05/28/2015   Chronic pain 05/28/2015   Bipolar II disorder, most recent episode major depressive 03/18/2015   Carpal tunnel syndrome on left 02/10/2015   Carpal tunnel syndrome on right 02/10/2015   Diabetes 01/23/2015   Obesity (BMI 30-39.9) 03/06/2014   Acute encephalopathy 03/05/2014   Hyperlipidemia 03/05/2014   Anemia, normocytic normochromic 03/05/2014   Altered mental state    Helicobacter pylori (H. pylori) infection 11/20/2012   Protein-calorie malnutrition, severe 10/09/2012   Dysphagia 08/19/2012   Gastroparesis 08/19/2012   Bladder tumor 08/08/2012   Biliary dyskinesia 11/02/2010    ONSET DATE: 05/26/2022  REFERRING DIAG: R73.9 (ICD-10-CM) - Hyperglycemia R29.898 (ICD-10-CM) - Right leg weakness R20.0 (ICD-10-CM) - Left sided numbness  THERAPY DIAG:  Muscle weakness  (generalized)  Unsteadiness on feet  Other abnormalities of gait and mobility  History of falling  Rationale for Evaluation and Treatment: Rehabilitation  SUBJECTIVE:                                                                                                                                                                                             SUBJECTIVE STATEMENT: Has a lot of new scratches on his arm and face - thinks he is scratching himself in his sleep. Had a fall 2 days ago, was stepping out of the shower and his left knee gave out. Grabbed for everything, but did not have anything to stop him. Did not hit his head. Reports talked to his landlord and reports that they won't put in any grab bars. Finally has a 2 wheeled rolling walker in the house.   Pt accompanied by: self  PERTINENT HISTORY: PMH: HTN, diabetes mellitus type 2 who is noncompliant with medication, HTN, HLD, Acute Kidney Injury, Hypokalemia, bipolar disorder, rheumatoid arthritis, diabetic peripheral neuropathy, hx of TIAs, tobacco abuse, hx of CVA (pt reports it affected his L side)  Per Dr. Riley Kill note in hospital: Pt with poorly controlled diabetes who presented on 05/23/22 weakness, malaise, and diarrhea. He was found to have a CBG (Capillary blood glucose) of 1184 and a Na+ of 121. Hgb A1C was 14.1! He was placed on IVF fluids and insulin and sugars are better controlled but sill remain in the 300's. Pt c/o numbness in hands and distal lower extremities as well as ongoing weakness, left > right. The weakness is not all entirely new as he has been experiencing symptoms for several months apparently. Prior to arrival he ambulating with a walker  and could perform ADL's   PAIN:  Are you having pain? Yes: NPRS scale: 4 (neuropathy pain)/10 Pain location: Lower legs, arms Pain description: Constant, sharp  Aggravating factors: Nothing, stays the same unless getting exerted  Relieving factors: Not really    Vitals:   06/26/22 1112  BP: (!) 98/52  Pulse: 87     PRECAUTIONS: Fall  WEIGHT BEARING RESTRICTIONS: No  FALLS: Has patient fallen in last 6 months? Yes. Number of falls 8, pt reports he has banged himself pretty good with these   LIVING ENVIRONMENT: Lives with:  girlfriend lives there 3x a week  Lives in: Mobile home Stairs: Yes: External: 6 steps; bilateral but cannot reach both Has following equipment at home: Single point cane, Walker - 2 wheeled, Environmental consultant - 4 wheeled, and shower chair  PLOF: Independent, Vocation/Vocational requirements: On disability (has been for years), and Leisure: going to [employer] races  pt reports he is still driving, Pt's girlfriend helps pt get dressed, take a shower, cooking him dinner, chores   PATIENT GOALS: Wants to get legs stronger, get better at feeding himself and feeling in hands   OBJECTIVE:   DIAGNOSTIC FINDINGS: CT head 05/23/22: IMPRESSION: No acute intracranial findings.  No CT evidence of acute infarction.    COGNITION: Overall cognitive status: Within functional limits for tasks assessed   TODAY'S TREATMENT:                                                                                                                               Therapeutic Activity: Goal Assessment: 5x sit <> stand: 28.7 seconds with BUE support  Gait speed: 20.82 seconds with rollator = 1.57 ft/sec  Educated on process of obtaining an AFO and how it goes through insurance and purpose of one. Also discussed precautions/concerns with pt's diabetes and decr sensation - that pt would need to do skin checks and need a larger size shoes in order to accommodate brace without as much rubbing.    GAIT: Gait pattern: step through pattern, decreased stride length, decreased hip/knee flexion- Right, decreased hip/knee flexion- Left, decreased ankle dorsiflexion- Right, decreased ankle dorsiflexion- Left, Right foot flat, Left foot flat, shuffling, wide BOS, poor foot  clearance- Right, and poor foot clearance- Left Distance walked: 2 x 80' in and out of session, 115' x 1 with use of L AFO  Assistive device utilized: Rollator Level of assistance: CGA Comments: Pt ambulated into clinic with rollator, pt reports he got a 2-wheeled RW and that he has this in his house. Provided tennis balls for this walker.  Trialed use of L [place] PLS AFO for improved foot clearance, knee stability for 115' with rollator, pt initially taking decr step length and slower gait speed due to getting used to AFO. Pt did demo improved foot clearance with LLE and pt also reporting LLE felt more steady during gait   TherEx: 10 reps sit <> stands with BUE support and standing,  trying without UE support for balance x10 reps 2 sets of 5 reps mini squats with BUE support and chair posteriorly, cues for proper ROM and weight shift. Pt fatigued easily with this and needed a rest break in between each    PATIENT EDUCATION: Education details: Additions to HEP, gave handout on fall prevention and went over with pt (pt reports that his landlord won't put grab bars into his bathroom), AFO trial and process of obtaining an AFO and will continue to trial in session  Person educated: Patient Education method: Explanation, Demonstration, and Handouts Education comprehension: verbalized understanding, returned demonstration, and verbal cues required  HOME EXERCISE PROGRAM: Access Code: [email or web address] URL: [email or web address] Date: 06/26/2022 Prepared by: Sherlie Ban  Exercises - Clamshell  - 1 x daily - 7 x weekly - 2 sets - 10-20 reps - Sitting Knee Extension with Resistance  - 1 x daily - 7 x weekly - 2 sets - 10 reps - Supine Bridge with Resistance Band  - 1 x daily - 7 x weekly - 3 sets - 12 reps - Hooklying Single Leg Bent Knee Fallouts with Resistance  - 1 x daily - 7 x weekly - 3 sets - 12 reps - Supine March with Resistance Band  - 1 x daily - 7 x weekly - 2 sets - 20  reps - Mini Squats with Walker and Chair  - 1-2 x daily - 7 x weekly - 2 sets - 5 reps - Sit to Stand with Armchair  - 1-2 x daily - 5 x weekly - 2 sets - 5 reps  GOALS: Goals reviewed with patient? Yes  SHORT TERM GOALS: Target date: 06/27/2022  Pt will be independent with initial HEP in order to build upon functional gains made in therapy. Baseline: Goal status: INITIAL  2.  Pt will improve BERG to at least a 18/56 in order to demo decr fall risk.  Baseline: 11/56 Goal status: INITIAL  3.  Pt will verbalize understanding of fall prevention in the home.  Baseline:  Goal status: MET  4.  Pt will improve gait speed with RW vs. LRAD to at least 1.3 ft/sec in order to demo decr fall risk.  Baseline:  33.34 seconds with RW = .98 ft/sec   20.82 seconds with rollator = 1.57 ft/sec Goal status: MET  5.  Pt will improve 5x sit<>stand to less than or equal to 44 sec with BUE support to demonstrate improved functional strength and transfer efficiency.   Baseline:  53.62 seconds with BUE support, unable to come fully to stand during last 2 reps, pt very fatigued afterwards    28.7 seconds with BUE support on 06/26/22 Goal status: MET    LONG TERM GOALS: Target date: 07/25/2022  Pt will be independent with final HEP in order to build upon functional gains made in therapy. Baseline:  Goal status: INITIAL  2.  Pt will improve BERG to at least a 28/56 in order to demo decr fall risk. Baseline: 11/56 Goal status: INITIAL  3.  Pt will go up and down 6 steps with step to pattern and single handrail with mod I in order to safely get in and out of his home.  Baseline: pt reports having a fall down his steps.  Goal status: INITIAL  4.   Pt will improve 5x sit<>stand to less than or equal to 25 sec with BUE support to demonstrate improved functional strength and transfer efficiency.  Baseline: 53.62 seconds with BUE support, unable  to come fully to stand during last 2 reps, pt very  fatigued afterwards   Goal status: REVISED  5.    Pt will improve gait speed with RW vs. LRAD to at least 2.0 ft/sec in order to demo improved community mobility.  Baseline: 33.34 seconds with RW = .98 ft/sec  Goal status: INITIAL    ASSESSMENT:  CLINICAL IMPRESSION: Began to assess STGs today. Pt has met 3 out of 5 STGs. Pt improved his gait speed with rollator and 5x sit <> stand time compared to eval (see above for more details). Also provided handout for fall prevention in the home. Pt has tried reaching out about getting grab bars installed, but he says his landlord won't allow that. Also trialed use of L PLS [place] AFO for improved foot clearance and gait mechanics. Pt tolerated well and did demo improved gait mechanics with pt also reporting feeling more stable. Discussed process of obtaining one and will practice again in a future session. Will continue to progress towards LTGs.   OBJECTIVE IMPAIRMENTS: Abnormal gait, decreased activity tolerance, decreased balance, decreased coordination, decreased endurance, decreased knowledge of condition, decreased knowledge of use of DME, decreased mobility, difficulty walking, decreased ROM, decreased strength, decreased safety awareness, impaired flexibility, impaired sensation, impaired UE functional use, postural dysfunction, and pain.   ACTIVITY LIMITATIONS: carrying, lifting, standing, stairs, transfers, bathing, dressing, self feeding, hygiene/grooming, and locomotion level  PARTICIPATION LIMITATIONS: meal prep, cleaning, laundry, shopping, and community activity  PERSONAL FACTORS: Age, Behavior pattern, Past/current experiences, Time since onset of injury/illness/exacerbation, and 3+ comorbidities: HTN, diabetes mellitus type 2 who is noncompliant with medication, HTN, HLD, Acute Kidney Injury, Hypokalemia, bipolar disorder, rheumatoid arthritis, diabetic peripheral neuropathy, hx of TIAs, tobacco abuse, hx of CVA (pt reports it affected  his L side)  are also affecting patient's functional outcome.   REHAB POTENTIAL: Good  CLINICAL DECISION MAKING: Evolving/moderate complexity  EVALUATION COMPLEXITY: Moderate  PLAN:  PT FREQUENCY: 2x/week  PT DURATION: 8 weeks  PLANNED INTERVENTIONS: Therapeutic exercises, Therapeutic activity, Neuromuscular re-education, Balance training, Gait training, Patient/Family education, Self Care, Stair training, Vestibular training, Visual/preceptual remediation/compensation, Orthotic/Fit training, DME instructions, Manual therapy, and Re-evaluation  PLAN FOR NEXT SESSION: strength, balance as able, proximal stability/dynamic stepping reaction (tolerate tall kneeling or quadruped for strengthening?), standing tolerance, stepping reaction with UE support, type of bracing to prevent knee buckling, finish checking goals, continue with AFO, C brace candidate?  Drake Leach, PT, DPT  06/26/2022, 2:17 PM

## 2022-06-26 NOTE — Therapy (Addendum)
OUTPATIENT OCCUPATIONAL THERAPY TREATMENT NOTE  Patient Name: Vincent [name] MRN: [id number] DOB:09-13-1965, 57 y.o., male Today's Date: 06/26/2022  PCP: Dois Davenport, MD REFERRING PROVIDER: Dois Davenport, MD  END OF SESSION:  OT End of Session - 06/26/22 1017     Visit Number 2    Number of Visits 17    Date for OT Re-Evaluation 09/01/22    Authorization Type [employer] - [place], auth not required    Progress Note Due on Visit 10    OT Start Time 1017    OT Stop Time 1101    OT Time Calculation (min) 44 min    Activity Tolerance Patient tolerated treatment well    Behavior During Therapy [facility] for tasks assessed/performed             Past Medical History:  Diagnosis Date   Anxiety    Benign essential tremor    Bipolar 2 disorder    followed by [facility]--- dr s. Lolly Mustache   Bladder cancer    recurrent   CAD (coronary artery disease)    cardiac cath 2003  and 2011 both showed normal coronary arteries w/ preserved lvf;  Non obstructive on CTA Oct 2019.    Chronic pain syndrome    back---- [facility] [place]   Cold extremities    BLE   COPD (chronic obstructive pulmonary disease)    DDD (degenerative disc disease), lumbar    Diabetic peripheral neuropathy    Gastroparesis    followed by dr Marina Goodell   GERD (gastroesophageal reflux disease)    Hiatal hernia    History of bladder cancer urologist-  previously dr Ronal Fear;  now dr gay   papillay TCC (Ta G1)  s/p TURBT and chemo instillation 2014   History of chest pain 12/2017   heart cath normal   History of encephalopathy 05/27/2015   admission w/ acute encephalopathy thought to be secondary to pain meds and COPD   History of gastric ulcer    History of Helicobacter pylori infection    History of kidney stones    History of TIA (transient ischemic attack) 2008  and 10-19-2018    no residual's   History of traumatic head injury 2010   w/ LOC  per pt needed stitches, hit in head with a mower blade    Hyperlipidemia    Hypertension    Hypogonadism male    s/p  bilateral orchiectomy   Hypothyroidism    Insomnia    Mild obstructive sleep apnea    study in epic 12-04-2016, no cpap   PTSD (post-traumatic stress disorder)    chronic   PTSD (post-traumatic stress disorder)    RA (rheumatoid arthritis)    followed by [facility]   Seizures, transient neurologist-  dr Terrace Arabia--  differential dx complex partial seizure .vs.  mood disorder .vs.  pseudoseizure--  negative EEG's   confusion episodes and staring spells since 11/ 2015   (03-26-2020 per pt wife last seizure 10 /2021)   Transient confusion NEUOROLOGIST-  DR Terrace Arabia   Episodes since 11/ 2015--  neurologist dx  differential complex partial seizure  .vs. mood disorder . vs. pseudoseizure   Type 2 diabetes mellitus treated with insulin    endocrinologist--- dr Everardo All---  (03-26-2020 pt does not check blood sugar at home)   Past Surgical History:  Procedure Laterality Date   AMPUTATION Left 04/28/2020   Procedure: LEFT LITTLE FINGER AMPUTATION;  Surgeon: Nadara Mustard, MD;  Location: [facility] OR;  Service: Orthopedics;  Laterality: Left;   CARDIAC CATHETERIZATION  12-27-2001  DR Jacinto Halim  &  05-26-2009  DR Eldridge Dace   RESULTS FOR BOTH ARE NORMAL CORONARIES AND PERSERVED LVF/ EF 60%   CARPAL TUNNEL RELEASE Bilateral right 09-16-2003;  left ?   CARPAL TUNNEL RELEASE Left 02/25/2015   Procedure: LEFT CARPAL TUNNEL RELEASE;  Surgeon: Betha Loa, MD;  Location: [facility];  Service: Orthopedics;  Laterality: Left;   CYSTOSCOPY N/A 10/10/2012   Procedure: CYSTOSCOPY CLOT EVACUATION FULGERATION OF BLEEDERS ;  Surgeon: Garnett Farm, MD;  Location: [facility];  Service: Urology;  Laterality: N/A;   CYSTOSCOPY WITH BIOPSY N/A 11/26/2015   Procedure: CYSTOSCOPY WITH BIOPSY AND FULGURATION;  Surgeon: Ihor Gully, MD;  Location: [facility];  Service: Urology;  Laterality: N/A;    ESOPHAGOGASTRODUODENOSCOPY  2014   LAPAROSCOPIC CHOLECYSTECTOMY  11-17-2010   ORCHIECTOMY Right 02/21/2016   Procedure: SCROTAL ORCHIECTOMY with TESTICULAR PROSTHESIS IMPLANT;  Surgeon: Ihor Gully, MD;  Location: [facility];  Service: Urology;  Laterality: Right;   ORCHIECTOMY Left 09/02/2018   Procedure: ORCHIECTOMY;  Surgeon: Ihor Gully, MD;  Location: [facility];  Service: Urology;  Laterality: Left;   ROTATOR CUFF REPAIR Right 12/2004   TRANSURETHRAL RESECTION OF BLADDER TUMOR N/A 08/09/2012   Procedure: TRANSURETHRAL RESECTION OF BLADDER TUMOR (TURBT) WITH GYRUS WITH MITOMYCIN C;  Surgeon: Garnett Farm, MD;  Location: [facility];  Service: Urology;  Laterality: N/A;   TRANSURETHRAL RESECTION OF BLADDER TUMOR N/A 03/29/2020   Procedure: TRANSURETHRAL RESECTION OF BLADDER TUMOR (TURBT) and post-op instillation of gemcitabine;  Surgeon: [name], MD;  Location: [facility];  Service: Urology;  Laterality: N/A;   TRANSURETHRAL RESECTION OF BLADDER TUMOR WITH GYRUS (TURBT-GYRUS) N/A 02/27/2014   Procedure: TRANSURETHRAL RESECTION OF BLADDER TUMOR WITH GYRUS (TURBT-GYRUS);  Surgeon: Garnett Farm, MD;  Location: [facility];  Service: Urology;  Laterality: N/A;   Patient Active Problem List   Diagnosis Date Noted   Peripheral neuropathy in hands 06/08/2022   Hyperosmolar hyperglycemic state (HHS) 05/24/2022   Essential hypertension 05/23/2022   BPH (benign prostatic hyperplasia) 05/23/2022   Diabetic hyperosmolar non-ketotic state 04/05/2022   AKI (acute kidney injury) 09/04/2021   Hypotension 09/03/2021   History of seizure 08/02/2021   Acute metabolic encephalopathy 07/28/2021   Suicide attempt by drug overdose 07/28/2021   Cerebral thrombosis with cerebral infarction 11/16/2020   Stroke-like symptoms 11/14/2020   Hyperkalemia 11/14/2020   Ischemic stroke 06/10/2020   Hyperglycemia    Gangrene of  finger of left hand    Pain due to onychomycosis of toenails of both feet 01/28/2020   Hyperglycemia due to type 2 diabetes mellitus 01/07/2020   Resistance to insulin 01/07/2020   Right leg weakness 04/24/2019   Right leg pain 04/24/2019   Gait abnormality 04/24/2019   TIA (transient ischemic attack) 10/20/2018   DM2 (diabetes mellitus, type 2) 10/19/2018   Left sided numbness 10/19/2018   Diabetic autonomic neuropathy associated with type 2 diabetes mellitus 02/01/2018   DM type 2 with diabetic peripheral neuropathy 02/01/2018   Dyslipidemia 01/31/2018   Coronary artery disease involving native coronary artery of native heart without angina pectoris 01/31/2018   Chest pain 01/01/2018   Breakthrough seizure 12/19/2016   Essential tremor 12/19/2016   Chronic post-traumatic stress disorder (PTSD) 06/16/2016   Tremor 06/16/2016   Tobacco abuse 05/05/2016   Altered mental status  05/28/2015   Acute bronchitis 05/28/2015   Type 2 diabetes mellitus with hyperglycemia 05/28/2015   Hypothyroidism 05/28/2015   Bipolar 2 disorder 05/28/2015   History of rheumatoid arthritis 05/28/2015   Chronic pain 05/28/2015   Bipolar II disorder, most recent episode major depressive 03/18/2015   Carpal tunnel syndrome on left 02/10/2015   Carpal tunnel syndrome on right 02/10/2015   Diabetes 01/23/2015   Obesity (BMI 30-39.9) 03/06/2014   Acute encephalopathy 03/05/2014   Hyperlipidemia 03/05/2014   Anemia, normocytic normochromic 03/05/2014   Altered mental state    Helicobacter pylori (H. pylori) infection 11/20/2012   Protein-calorie malnutrition, severe 10/09/2012   Dysphagia 08/19/2012   Gastroparesis 08/19/2012   Bladder tumor 08/08/2012   Biliary dyskinesia 11/02/2010    ONSET DATE: onset ~1-2 months ago (Feb or March), per pt (06/06/2022 (date of referral)0  REFERRING DIAG: G62.9 (ICD-10-CM) - Neuropathy  THERAPY DIAG:  Muscle weakness (generalized)  Other lack of  coordination  Other disturbances of skin sensation  Rationale for Evaluation and Treatment: Habilitation  SUBJECTIVE:   SUBJECTIVE STATEMENT: Pt states my hands are numb and his coordination is way off- he can't write well or hold a glass without a handle.    PERTINENT HISTORY: Per Dr. Riley Kill note in hospital: "Pt with poorly controlled diabetes who presented on 05/23/22 weakness, malaise, and diarrhea. He was found to have a CBG (Capillary blood glucose) of 1184 and a Na+ of 121. Hgb A1C was 14.1! He was placed on IVF fluids and insulin and sugars are better controlled but sill remain in the 300's. Pt c/o numbness in hands and distal lower extremities as well as ongoing weakness, left > right. The weakness is not all entirely new as he has been experiencing symptoms for several months apparently. Prior to arrival he ambulating with a walker and could perform ADL's"   PRECAUTIONS: Fall  WEIGHT BEARING RESTRICTIONS: No  PAIN:  Are you having pain? No NPRS scale: 0/10 Pain location: he states he has no pain, he's just numb    FALLS: Has patient fallen in last 6 months? Yes. 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; driving  PATIENT GOALS: Being able to work in yard to hold garden tools and grilling ribs   OBJECTIVE: (All objective assessments below are from initial evaluation on: 06/22/22 unless otherwise specified.)   HAND DOMINANCE: Left - reports he is using R hand more due to neuropathy  ADLs: Overall ADLs: mod I  IADLs: Generally mod I but has family to come in and help as needed Handwriting: 25% legible  MOBILITY STATUS: Independent  ACTIVITY TOLERANCE: Activity tolerance: fair  FUNCTIONAL OUTCOME  MEASURES: Quick Dash: 68.2% disability   UPPER EXTREMITY ROM:     AROM Right (eval) Left (eval)  Shoulder flexion WNL WNL  Shoulder abduction WNL WNL  Elbow flexion WNL WNL  Elbow extension WNL WNL  Wrist flexion WNL WNL  Wrist extension WNL WNL  Wrist pronation WNL WNL  Wrist supination WNL WNL  Digit Composite Flexion [facility] WFL  Digit Composite Extension WNL WNL  Digit Opposition WFL WFL  (Blank rows = not tested)  IR B WFL but limited to pain; L pinky finger amputated at MCP  UPPER EXTREMITY MMT:  MMT Right (eval) Left (eval)  Shoulder flexion [facility]  Shoulder abduction [facility]  Elbow flexion [facility] WFL  Elbow extension WFL WFL  (Blank rows = not tested)  HAND FUNCTION: Grip strength: Right: 44 lbs; Left: 24.4 lbs  COORDINATION: 9 Hole Peg test: Right: 114 sec; Left: placed 5 pegs in 180 sec Tremors: action, Right, and Left Holds LUE abducted from body with 9 hole completion; L tremor >R  SENSATION:  06/26/22: unable to differentiate with disc-criminator; monofiament test: Lt thumb purple (reduced light touch), IF red (borderline loss of protective sense), RF white (loss of protective sense); Rt thumb & IF: red (borderline loss protective sense), SF purple (reduced light touch)    Reports he is unable to fell things though able to detect light touch with exception to medial aspect of L pinky finger  EDEMA: mild swelling reported and observed   OBSERVATIONS:  06/26/22: arrives with bloody scratches to bil arms, elbows, his face, etc.   Pt ambulates from lobby to treatment room with use of cane. No LOB noted, but pt is unsteady with altered gait. Pt appears well-kept.    TODAY'S TREATMENT:                                                                                                                               06/26/22: Pt educated on using built-up foam to help hold food and writing utensils. He performs a simulated food spearing task and does much better  with built up handle. Likewise he does writing task with/without built up handle and does much better with. He is given this foam tubing to use at home. Lastly, he is edu on coordination HEP as listed below. Initially he is unable to do rotation with Lt hand, but after doing the other 3 activities, he is then able to perform.   He is asked to do 2-3 x day for 15 mins to improve his skills.   In-Hand Manipulation Skills  Rotation:  Hold pen, try to "twirl" like a baton, keeping parallel (or flat) with surface of table. Try going BOTH directions 10x   Flip:  Hold pen in writing position,  flip in an arch to "erase" position, then back to "write" position. Do not lift hand off table.  10x   Translation:  Open hand palm up,  put an object (bigger is easier (fat marker), smaller is harder (penny)) in your palm and then use your fingers and thumb to move it to the tips of your fingers, pinched against your thumb.  10x   Shift:  Hold pen like a dart, start "shifting" it forward (like putting a key in a key hole) until you are holding it at the base, then shift it backwards until you are holding it at the tip again. 10x    PATIENT EDUCATION: Education details: OT role and POC Person educated: Patient Education method: Explanation Education comprehension: verbalized understanding  HOME EXERCISE  PROGRAM: Se tx section above for details   GOALS:  SHORT TERM GOALS: Target date: 07/21/2022    Patient will demonstrate independence with initial BUE HEP.  Baseline: Goal status: INITIAL  2.  Patient will complete nine-hole peg with use of R in 60 seconds or less.  Baseline: 114 seconds Goal status: INITIAL  3.  Pt will independently recall at least 2 tremor reduction strategies.  Baseline: unable Goal status: INITIAL   LONG TERM GOALS: Target date: 09/01/2022    Patient will demonstrate updated BUE HEP with 25% verbal cues or less for proper execution.  Baseline:  Goal status:  INITIAL  2.  Patient will demonstrate at least 16% improvement with quick Dash score (reporting 52.2% disability or less) indicating improved functional use of affected extremity.  Baseline: 68.2% disability with BUE Goal status: INITIAL  3.  Pt will demonstrate at least 75% legibility with handwriting  Baseline: 25% legibility Goal status: INITIAL  4.  Patient will demonstrate at least 34 lbs L grip strength as needed to open jars and other containers.  Baseline: Left: 24.4 lbs Goal status: INITIAL  5.  Patient will complete nine-hole peg with use of L in 180 seconds or less.  Baseline: unable to complete, placed 5 pegs in 180 seconds Goal status: INITIAL  ASSESSMENT:  CLINICAL IMPRESSION: 06/26/22: He does well to learn new FMS tasks today, shows some early progress. No pain today  Eval: Patient is a 57 y.o. male who was seen today for occupational therapy evaluation for neuropathy. Hx includes HTN, diabetes mellitus type 2 who is noncompliant with medication, HTN, HLD, Acute Kidney Injury, Hypokalemia, bipolar disorder, rheumatoid arthritis, diabetic peripheral neuropathy, hx of TIAs, tobacco abuse, hx of CVA (pt reports it affected his L side) . Patient currently presents below baseline level of functioning demonstrating functional deficits and impairments as noted below. Pt would benefit from skilled OT services in the outpatient setting to work on impairments as noted below to help pt return to PLOF as able.     PLAN:  OT FREQUENCY: 2x/week  OT DURATION: 8 weeks  PLANNED INTERVENTIONS: self care/ADL training, therapeutic exercise, therapeutic activity, neuromuscular re-education, manual therapy, passive range of motion, functional mobility training, splinting, electrical stimulation, ultrasound, paraffin, fluidotherapy, moist heat, cryotherapy, contrast bath, patient/family education, DME and/or AE instructions, and Re-evaluation  CONSULTED AND AGREED WITH PLAN OF CARE:  Patient  PLAN FOR NEXT SESSION:  Rev HEP, edu on/give putty HEPs, consider ulnar nerve testing and possible anti-claw splint if a test shows improved function. Also consider nerve glides and hand/wrist stretch program.    Fannie Knee, OT 06/26/2022, 11:04 AM

## 2022-06-26 NOTE — Patient Instructions (Signed)
Fall Prevention in the Home, Adult Falls can cause injuries and can happen to people of all ages. There are many things you can do to make your home safer and to help prevent falls. What actions can I take to prevent falls? General information Use good lighting in all rooms. Make sure to: Replace any light bulbs that burn out. Turn on the lights in dark areas and use night-lights. Keep items that you use often in easy-to-reach places. Lower the shelves around your home if needed. Move furniture so that there are clear paths around it. Do not use throw rugs or other things on the floor that can make you trip. If any of your floors are uneven, fix them. Add color or contrast paint or tape to clearly mark and help you see: Grab bars or handrails. First and last steps of staircases. Where the edge of each step is. If you use a ladder or stepladder: Make sure that it is fully opened. Do not climb a closed ladder. Make sure the sides of the ladder are locked in place. Have someone hold the ladder while you use it. Know where your pets are as you move through your home. What can I do in the bathroom?     Keep the floor dry. Clean up any water on the floor right away. Remove soap buildup in the bathtub or shower. Buildup makes bathtubs and showers slippery. Use non-skid mats or decals on the floor of the bathtub or shower. Attach bath mats securely with double-sided, non-slip rug tape. If you need to sit down in the shower, use a non-slip stool. Install grab bars by the toilet and in the bathtub and shower. Do not use towel bars as grab bars. What can I do in the bedroom? Make sure that you have a light by your bed that is easy to reach. Do not use any sheets or blankets on your bed that hang to the floor. Have a firm chair or bench with side arms that you can use for support when you get dressed. What can I do in the kitchen? Clean up any spills right away. If you need to reach something  above you, use a step stool with a grab bar. Keep electrical cords out of the way. Do not use floor polish or wax that makes floors slippery. What can I do with my stairs? Do not leave anything on the stairs. Make sure that you have a light switch at the top and the bottom of the stairs. Make sure that there are handrails on both sides of the stairs. Fix handrails that are broken or loose. Install non-slip stair treads on all your stairs if they do not have carpet. Avoid having throw rugs at the top or bottom of the stairs. Choose a carpet that does not hide the edge of the steps on the stairs. Make sure that the carpet is firmly attached to the stairs. Fix carpet that is loose or worn. What can I do on the outside of my home? Use bright outdoor lighting. Fix the edges of walkways and driveways and fix any cracks. Clear paths of anything that can make you trip, such as tools or rocks. Add color or contrast paint or tape to clearly mark and help you see anything that might make you trip as you walk through a door, such as a raised step or threshold. Trim any bushes or trees on paths to your home. Check to see if handrails are loose   or broken and that both sides of all steps have handrails. Install guardrails along the edges of any raised decks and porches. Have leaves, snow, or ice cleared regularly. Use sand, salt, or ice melter on paths if you live where there is ice and snow during the winter. Clean up any spills in your garage right away. This includes grease or oil spills. What other actions can I take? Review your medicines with your doctor. Some medicines can cause dizziness or changes in blood pressure, which increase your risk of falling. Wear shoes that: Have a low heel. Do not wear high heels. Have rubber bottoms and are closed at the toe. Feel good on your feet and fit well. Use tools that help you move around if needed. These include: Canes. Walkers. Scooters. Crutches. Ask  your doctor what else you can do to help prevent falls. This may include seeing a physical therapist to learn to do exercises to move better and get stronger. Where to find more information Centers for Disease Control and Prevention, [employer]: cdc.gov National Institute on Aging: nia.nih.gov National Institute on Aging: nia.nih.gov Contact a doctor if: You are afraid of falling at home. You feel weak, drowsy, or dizzy at home. You fall at home. Get help right away if you: Lose consciousness or have trouble moving after a fall. Have a fall that causes a head injury. These symptoms may be an emergency. Get help right away. Call 911. Do not wait to see if the symptoms will go away. Do not drive yourself to the hospital. This information is not intended to replace advice given to you by your health care provider. Make sure you discuss any questions you have with your health care provider. Document Revised: 10/31/2021 Document Reviewed: 10/31/2021 Elsevier Patient Education  2023 [employer]  

## 2022-06-29 ENCOUNTER — Ambulatory Visit: Payer: Medicare HMO | Admitting: Physical Therapy

## 2022-07-03 ENCOUNTER — Encounter: Payer: Self-pay | Admitting: Physical Therapy

## 2022-07-03 ENCOUNTER — Ambulatory Visit: Payer: Medicare HMO | Admitting: Physical Therapy

## 2022-07-03 ENCOUNTER — Ambulatory Visit: Payer: Medicare HMO

## 2022-07-03 VITALS — BP 144/76 | HR 88

## 2022-07-03 DIAGNOSIS — R208 Other disturbances of skin sensation: Secondary | ICD-10-CM

## 2022-07-03 DIAGNOSIS — R2689 Other abnormalities of gait and mobility: Secondary | ICD-10-CM

## 2022-07-03 DIAGNOSIS — M6281 Muscle weakness (generalized): Secondary | ICD-10-CM

## 2022-07-03 DIAGNOSIS — R2681 Unsteadiness on feet: Secondary | ICD-10-CM

## 2022-07-03 DIAGNOSIS — Z9181 History of falling: Secondary | ICD-10-CM

## 2022-07-03 DIAGNOSIS — R278 Other lack of coordination: Secondary | ICD-10-CM

## 2022-07-03 NOTE — Therapy (Signed)
OUTPATIENT OCCUPATIONAL THERAPY TREATMENT NOTE  Patient Name: Vincent [name] MRN: [id number] DOB:1965/12/07, 57 y.o., male Today's Date: 07/03/2022  PCP: Dois Davenport, MD REFERRING PROVIDER: Dois Davenport, MD  END OF SESSION:  OT End of Session - 07/03/22 1229     Visit Number 3    Number of Visits 17    Date for OT Re-Evaluation 09/01/22    Authorization Type [employer] - [place], auth not required    Progress Note Due on Visit 10    OT Start Time 1230    OT Stop Time 1315    OT Time Calculation (min) 45 min    Activity Tolerance Patient tolerated treatment well    Behavior During Therapy [facility] for tasks assessed/performed             Past Medical History:  Diagnosis Date   Anxiety    Benign essential tremor    Bipolar 2 disorder    followed by [facility]--- dr s. Lolly Mustache   Bladder cancer    recurrent   CAD (coronary artery disease)    cardiac cath 2003  and 2011 both showed normal coronary arteries w/ preserved lvf;  Non obstructive on CTA Oct 2019.    Chronic pain syndrome    back---- [facility] [place]   Cold extremities    BLE   COPD (chronic obstructive pulmonary disease)    DDD (degenerative disc disease), lumbar    Diabetic peripheral neuropathy    Gastroparesis    followed by dr Marina Goodell   GERD (gastroesophageal reflux disease)    Hiatal hernia    History of bladder cancer urologist-  previously dr Ronal Fear;  now dr gay   papillay TCC (Ta G1)  s/p TURBT and chemo instillation 2014   History of chest pain 12/2017   heart cath normal   History of encephalopathy 05/27/2015   admission w/ acute encephalopathy thought to be secondary to pain meds and COPD   History of gastric ulcer    History of Helicobacter pylori infection    History of kidney stones    History of TIA (transient ischemic attack) 2008  and 10-19-2018    no residual's   History of traumatic head injury 2010   w/ LOC  per pt needed stitches, hit in head with a mower blade    Hyperlipidemia    Hypertension    Hypogonadism male    s/p  bilateral orchiectomy   Hypothyroidism    Insomnia    Mild obstructive sleep apnea    study in epic 12-04-2016, no cpap   PTSD (post-traumatic stress disorder)    chronic   PTSD (post-traumatic stress disorder)    RA (rheumatoid arthritis)    followed by [facility]   Seizures, transient neurologist-  dr Terrace Arabia--  differential dx complex partial seizure .vs.  mood disorder .vs.  pseudoseizure--  negative EEG's   confusion episodes and staring spells since 11/ 2015   (03-26-2020 per pt wife last seizure 10 /2021)   Transient confusion NEUOROLOGIST-  DR Terrace Arabia   Episodes since 11/ 2015--  neurologist dx  differential complex partial seizure  .vs. mood disorder . vs. pseudoseizure   Type 2 diabetes mellitus treated with insulin    endocrinologist--- dr Everardo All---  (03-26-2020 pt does not check blood sugar at home)   Past Surgical History:  Procedure Laterality Date   AMPUTATION Left 04/28/2020   Procedure: LEFT LITTLE FINGER AMPUTATION;  Surgeon: Nadara Mustard, MD;  Location: [facility] OR;  Service: Orthopedics;  Laterality: Left;   CARDIAC CATHETERIZATION  12-27-2001  DR Jacinto Halim  &  05-26-2009  DR Eldridge Dace   RESULTS FOR BOTH ARE NORMAL CORONARIES AND PERSERVED LVF/ EF 60%   CARPAL TUNNEL RELEASE Bilateral right 09-16-2003;  left ?   CARPAL TUNNEL RELEASE Left 02/25/2015   Procedure: LEFT CARPAL TUNNEL RELEASE;  Surgeon: Betha Loa, MD;  Location: [facility];  Service: Orthopedics;  Laterality: Left;   CYSTOSCOPY N/A 10/10/2012   Procedure: CYSTOSCOPY CLOT EVACUATION FULGERATION OF BLEEDERS ;  Surgeon: Garnett Farm, MD;  Location: [facility];  Service: Urology;  Laterality: N/A;   CYSTOSCOPY WITH BIOPSY N/A 11/26/2015   Procedure: CYSTOSCOPY WITH BIOPSY AND FULGURATION;  Surgeon: Ihor Gully, MD;  Location: [facility];  Service: Urology;  Laterality: N/A;    ESOPHAGOGASTRODUODENOSCOPY  2014   LAPAROSCOPIC CHOLECYSTECTOMY  11-17-2010   ORCHIECTOMY Right 02/21/2016   Procedure: SCROTAL ORCHIECTOMY with TESTICULAR PROSTHESIS IMPLANT;  Surgeon: Ihor Gully, MD;  Location: [facility];  Service: Urology;  Laterality: Right;   ORCHIECTOMY Left 09/02/2018   Procedure: ORCHIECTOMY;  Surgeon: Ihor Gully, MD;  Location: [facility];  Service: Urology;  Laterality: Left;   ROTATOR CUFF REPAIR Right 12/2004   TRANSURETHRAL RESECTION OF BLADDER TUMOR N/A 08/09/2012   Procedure: TRANSURETHRAL RESECTION OF BLADDER TUMOR (TURBT) WITH GYRUS WITH MITOMYCIN C;  Surgeon: Garnett Farm, MD;  Location: [facility];  Service: Urology;  Laterality: N/A;   TRANSURETHRAL RESECTION OF BLADDER TUMOR N/A 03/29/2020   Procedure: TRANSURETHRAL RESECTION OF BLADDER TUMOR (TURBT) and post-op instillation of gemcitabine;  Surgeon: [name], MD;  Location: [facility];  Service: Urology;  Laterality: N/A;   TRANSURETHRAL RESECTION OF BLADDER TUMOR WITH GYRUS (TURBT-GYRUS) N/A 02/27/2014   Procedure: TRANSURETHRAL RESECTION OF BLADDER TUMOR WITH GYRUS (TURBT-GYRUS);  Surgeon: Garnett Farm, MD;  Location: [facility];  Service: Urology;  Laterality: N/A;   Patient Active Problem List   Diagnosis Date Noted   Peripheral neuropathy in hands 06/08/2022   Hyperosmolar hyperglycemic state (HHS) 05/24/2022   Essential hypertension 05/23/2022   BPH (benign prostatic hyperplasia) 05/23/2022   Diabetic hyperosmolar non-ketotic state 04/05/2022   AKI (acute kidney injury) 09/04/2021   Hypotension 09/03/2021   History of seizure 08/02/2021   Acute metabolic encephalopathy 07/28/2021   Suicide attempt by drug overdose 07/28/2021   Cerebral thrombosis with cerebral infarction 11/16/2020   Stroke-like symptoms 11/14/2020   Hyperkalemia 11/14/2020   Ischemic stroke 06/10/2020   Hyperglycemia    Gangrene of  finger of left hand    Pain due to onychomycosis of toenails of both feet 01/28/2020   Hyperglycemia due to type 2 diabetes mellitus 01/07/2020   Resistance to insulin 01/07/2020   Right leg weakness 04/24/2019   Right leg pain 04/24/2019   Gait abnormality 04/24/2019   TIA (transient ischemic attack) 10/20/2018   DM2 (diabetes mellitus, type 2) 10/19/2018   Left sided numbness 10/19/2018   Diabetic autonomic neuropathy associated with type 2 diabetes mellitus 02/01/2018   DM type 2 with diabetic peripheral neuropathy 02/01/2018   Dyslipidemia 01/31/2018   Coronary artery disease involving native coronary artery of native heart without angina pectoris 01/31/2018   Chest pain 01/01/2018   Breakthrough seizure 12/19/2016   Essential tremor 12/19/2016   Chronic post-traumatic stress disorder (PTSD) 06/16/2016   Tremor 06/16/2016   Tobacco abuse 05/05/2016   Altered mental status  05/28/2015   Acute bronchitis 05/28/2015   Type 2 diabetes mellitus with hyperglycemia 05/28/2015   Hypothyroidism 05/28/2015   Bipolar 2 disorder 05/28/2015   History of rheumatoid arthritis 05/28/2015   Chronic pain 05/28/2015   Bipolar II disorder, most recent episode major depressive 03/18/2015   Carpal tunnel syndrome on left 02/10/2015   Carpal tunnel syndrome on right 02/10/2015   Diabetes 01/23/2015   Obesity (BMI 30-39.9) 03/06/2014   Acute encephalopathy 03/05/2014   Hyperlipidemia 03/05/2014   Anemia, normocytic normochromic 03/05/2014   Altered mental state    Helicobacter pylori (H. pylori) infection 11/20/2012   Protein-calorie malnutrition, severe 10/09/2012   Dysphagia 08/19/2012   Gastroparesis 08/19/2012   Bladder tumor 08/08/2012   Biliary dyskinesia 11/02/2010    ONSET DATE: onset ~1-2 months ago (Feb or March), per pt (06/06/2022 (date of referral)0  REFERRING DIAG: G62.9 (ICD-10-CM) - Neuropathy  THERAPY DIAG:  Muscle weakness (generalized)  Other lack of  coordination  Other disturbances of skin sensation  Unsteadiness on feet  History of falling  Rationale for Evaluation and Treatment: Rehabilitation  SUBJECTIVE:   SUBJECTIVE STATEMENT: Pt reports the sucralfate medication caused an allergic reaction and no longer taking. MD is aware of this. Pt showing OT RLE, multiple scabbed over wounds. Pt reports MD has seen this as well.    PERTINENT HISTORY: Per Dr. Riley Kill note in hospital: "Pt with poorly controlled diabetes who presented on 05/23/22 weakness, malaise, and diarrhea. He was found to have a CBG (Capillary blood glucose) of 1184 and a Na+ of 121. Hgb A1C was 14.1! He was placed on IVF fluids and insulin and sugars are better controlled but sill remain in the 300's. Pt c/o numbness in hands and distal lower extremities as well as ongoing weakness, left > right. The weakness is not all entirely new as he has been experiencing symptoms for several months apparently. Prior to arrival he ambulating with a walker and could perform ADL's"   PRECAUTIONS: Fall  WEIGHT BEARING RESTRICTIONS: No  PAIN:  Are you having pain? Yes NPRS scale: 7/10 Pain location: BLE Description: Dull pain Knee to feet   FALLS: Has patient fallen in last 6 months? Yes. 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; driving  PATIENT GOALS: Being able to work in yard to hold garden tools and grilling ribs   OBJECTIVE: (All objective assessments below are from initial evaluation on: 06/22/22 unless otherwise specified.)   HAND DOMINANCE: Left - reports he is using R hand more due to neuropathy  ADLs: Overall ADLs: mod I  IADLs: Generally mod I but has family to come in and help as  needed Handwriting: 25% legible  MOBILITY STATUS: Independent  ACTIVITY TOLERANCE: Activity tolerance: fair  FUNCTIONAL OUTCOME MEASURES: Quick Dash: 68.2% disability   UPPER EXTREMITY ROM:     AROM Right (eval) Left (eval)  Shoulder flexion WNL WNL  Shoulder abduction WNL WNL  Elbow flexion WNL WNL  Elbow extension WNL WNL  Wrist flexion WNL WNL  Wrist extension WNL WNL  Wrist pronation WNL WNL  Wrist supination WNL WNL  Digit Composite Flexion [facility] WFL  Digit Composite Extension WNL WNL  Digit Opposition WFL WFL  (Blank rows = not tested)  IR  B WFL but limited to pain; L pinky finger amputated at MCP  UPPER EXTREMITY MMT:     MMT Right (eval) Left (eval)  Shoulder flexion [facility]  Shoulder abduction [facility]  Elbow flexion [facility] WFL  Elbow extension WFL WFL  (Blank rows = not tested)  HAND FUNCTION: Grip strength: Right: 44 lbs; Left: 24.4 lbs  COORDINATION: 9 Hole Peg test: Right: 114 sec; Left: placed 5 pegs in 180 sec Tremors: action, Right, and Left Holds LUE abducted from body with 9 hole completion; L tremor >R  SENSATION:  06/26/22: unable to differentiate with disc-criminator; monofiament test: Lt thumb purple (reduced light touch), IF red (borderline loss of protective sense), RF white (loss of protective sense); Rt thumb & IF: red (borderline loss protective sense), SF purple (reduced light touch)    Reports he is unable to fell things though able to detect light touch with exception to medial aspect of L pinky finger  EDEMA: mild swelling reported and observed   OBSERVATIONS:  07/03/22: Pt walked in with [facility] and reports having more pain today, continued decreased sensation in hands. Pt has scabs on fingers as well.  06/26/22: arrives with bloody scratches to bil arms, elbows, his face, etc.   Pt ambulates from lobby to treatment room with use of cane. No LOB noted, but pt is unsteady with altered gait. Pt appears well-kept.    TODAY'S TREATMENT:                                                                                                                                Reviewed use of built-up foam on pens and food utensils. Pt reports it helps, but "still have a long way to go." Trialed weighted utensil to see if it would prevent tremor with self-feeding; however, unsuccessful.   OT creating sensory bin this visit to address decreased sensation. Items included tennis ball, theraband, tissue, pencil grip, pencil, small rubber pink textured ball, medium purple spiked ball, gold ball, brush, spoon, bean bag, paper clip, dice, washer, nut, card, rubber band. For the right hand, pt had difficulty detecting smaller items such as penny, paper clip, rubber band, and washer; tennis ball, theraband, tissue, card, rubber ball, and pencil grip. For left hand, pt had difficulty with detecting tissue, pencil grip, built up foam, bolt, penny, paper clip. To note, pt with intention tremor. Attempted to add wrist weight to provide improved control; however, was not sufficient. Throughout task, pt requiring intermittent cues for type of descriptive words in attempts to  help with identifying items. Education provided how to create sensory bin at home. Pt receptive. Sensation seemed to be better on the left compared to right; however, pt with equal difficulty with smaller items. Sensory diet HEP initiated this date. Please refer to pt instructions.    PATIENT EDUCATION: Education details: Comptroller, sensory diet Person educated: Patient Education method: Explanation Education comprehension: verbalized understanding  HOME EXERCISE  PROGRAM: 07/03/22: Duanne Moron diet   GOALS:  SHORT TERM GOALS: Target date: 07/21/2022    Patient will demonstrate independence with initial BUE HEP.  Baseline: Goal status: INITIAL  2.  Patient will complete nine-hole peg with use of R in 60 seconds or less.  Baseline: 114 seconds Goal status: INITIAL  3.  Pt will  independently recall at least 2 tremor reduction strategies.  Baseline: unable Goal status: INITIAL   LONG TERM GOALS: Target date: 09/01/2022    Patient will demonstrate updated BUE HEP with 25% verbal cues or less for proper execution.  Baseline:  Goal status: INITIAL  2.  Patient will demonstrate at least 16% improvement with quick Dash score (reporting 52.2% disability or less) indicating improved functional use of affected extremity.  Baseline: 68.2% disability with BUE Goal status: INITIAL  3.  Pt will demonstrate at least 75% legibility with handwriting  Baseline: 25% legibility Goal status: INITIAL  4.  Patient will demonstrate at least 34 lbs L grip strength as needed to open jars and other containers.  Baseline: Left: 24.4 lbs Goal status: INITIAL  5.  Patient will complete nine-hole peg with use of L in 180 seconds or less.  Baseline: unable to complete, placed 5 pegs in 180 seconds Goal status: INITIAL  ASSESSMENT:  CLINICAL IMPRESSION: Pt a bit worse today, specifically with pain rating 7/10 today BLE, compared to 0/10 previous OT visit. Pt had allergic reaction to medication that MD is aware of, and pt has stopped taking. Pt with poor skin integrity based on wounds on legs and scabbed wounds on a few digits. Pt reports having smoking a cigarette and did not realize it was burning his skin. Pt with difficulty with sensation, R>L with stereognosis task, grasp, and left intention tremor. Education provided today that sensation may not fully return. Pt aware and reports his doctor told him his nerve damage is "pretty bad." Pt would continue to benefit from skilled OT services to address performance deficits as noted, and to improve overall independence with functional activity.   PLAN:  OT FREQUENCY: 2x/week  OT DURATION: 8 weeks  PLANNED INTERVENTIONS: self care/ADL training, therapeutic exercise, therapeutic activity, neuromuscular re-education, manual therapy,  passive range of motion, functional mobility training, splinting, electrical stimulation, ultrasound, paraffin, fluidotherapy, moist heat, cryotherapy, contrast bath, patient/family education, DME and/or AE instructions, and Re-evaluation  CONSULTED AND AGREED WITH PLAN OF CARE: Patient  PLAN FOR NEXT SESSION:  Review sensory bin and diet, Edu on/give putty HEPs, consider ulnar nerve testing and possible anti-claw splint if a test shows improved function. Also consider nerve glides and hand/wrist stretch program, weight carries to simulate groceries (pt reports difficulty and now getting delivered)   [employer], OT 07/03/2022, 1:33 PM

## 2022-07-03 NOTE — Therapy (Signed)
OUTPATIENT PHYSICAL THERAPY NEURO TREATMENT   Patient Name: Vincent [name] MRN: [id number] DOB:01-27-1966, 57 y.o., male Today's Date: 07/03/2022  PCP: Dois Davenport, MD REFERRING PROVIDER: Calvert Cantor, MD   END OF SESSION:  PT End of Session - 07/03/22 1320     Visit Number 8    Number of Visits 17    Date for PT Re-Evaluation 07/29/22    Authorization Type [employer]    PT Start Time 1316    PT Stop Time 1355    PT Time Calculation (min) 39 min    Equipment Utilized During Treatment Gait belt    Activity Tolerance Patient tolerated treatment well    Behavior During Therapy [facility] for tasks assessed/performed              Past Medical History:  Diagnosis Date   Anxiety    Benign essential tremor    Bipolar 2 disorder    followed by [facility]--- dr s. Lolly Mustache   Bladder cancer    recurrent   CAD (coronary artery disease)    cardiac cath 2003  and 2011 both showed normal coronary arteries w/ preserved lvf;  Non obstructive on CTA Oct 2019.    Chronic pain syndrome    back---- [facility] [place]   Cold extremities    BLE   COPD (chronic obstructive pulmonary disease)    DDD (degenerative disc disease), lumbar    Diabetic peripheral neuropathy    Gastroparesis    followed by dr Marina Goodell   GERD (gastroesophageal reflux disease)    Hiatal hernia    History of bladder cancer urologist-  previously dr Ronal Fear;  now dr gay   papillay TCC (Ta G1)  s/p TURBT and chemo instillation 2014   History of chest pain 12/2017   heart cath normal   History of encephalopathy 05/27/2015   admission w/ acute encephalopathy thought to be secondary to pain meds and COPD   History of gastric ulcer    History of Helicobacter pylori infection    History of kidney stones    History of TIA (transient ischemic attack) 2008  and 10-19-2018    no residual's   History of traumatic head injury 2010   w/ LOC  per pt needed stitches, hit in head with a mower blade    Hyperlipidemia    Hypertension    Hypogonadism male    s/p  bilateral orchiectomy   Hypothyroidism    Insomnia    Mild obstructive sleep apnea    study in epic 12-04-2016, no cpap   PTSD (post-traumatic stress disorder)    chronic   PTSD (post-traumatic stress disorder)    RA (rheumatoid arthritis)    followed by [facility]   Seizures, transient neurologist-  dr Terrace Arabia--  differential dx complex partial seizure .vs.  mood disorder .vs.  pseudoseizure--  negative EEG's   confusion episodes and staring spells since 11/ 2015   (03-26-2020 per pt wife last seizure 10 /2021)   Transient confusion NEUOROLOGIST-  DR Terrace Arabia   Episodes since 11/ 2015--  neurologist dx  differential complex partial seizure  .vs. mood disorder . vs. pseudoseizure   Type 2 diabetes mellitus treated with insulin    endocrinologist--- dr Everardo All---  (03-26-2020 pt does not check blood sugar at home)   Past Surgical History:  Procedure Laterality Date   AMPUTATION Left 04/28/2020   Procedure: LEFT LITTLE FINGER AMPUTATION;  Surgeon: Nadara Mustard, MD;  Location: [facility]  OR;  Service: Orthopedics;  Laterality: Left;   CARDIAC CATHETERIZATION  12-27-2001  DR Einar Gip  &  05-26-2009  DR Irish Lack   RESULTS FOR BOTH ARE NORMAL CORONARIES AND PERSERVED LVF/ EF 60%   CARPAL TUNNEL RELEASE Bilateral right 09-16-2003;  left ?   CARPAL TUNNEL RELEASE Left 02/25/2015   Procedure: LEFT CARPAL TUNNEL RELEASE;  Surgeon: Leanora Cover, MD;  Location: [place];  Service: Orthopedics;  Laterality: Left;   CYSTOSCOPY N/A 10/10/2012   Procedure: CYSTOSCOPY CLOT EVACUATION FULGERATION OF BLEEDERS ;  Surgeon: Claybon Jabs, MD;  Location: [facility];  Service: Urology;  Laterality: N/A;   CYSTOSCOPY WITH BIOPSY N/A 11/26/2015   Procedure: CYSTOSCOPY WITH BIOPSY AND FULGURATION;  Surgeon: Kathie Rhodes, MD;  Location: [place];  Service: Urology;  Laterality: N/A;    ESOPHAGOGASTRODUODENOSCOPY  2014   LAPAROSCOPIC CHOLECYSTECTOMY  11-17-2010   ORCHIECTOMY Right 02/21/2016   Procedure: SCROTAL ORCHIECTOMY with TESTICULAR PROSTHESIS IMPLANT;  Surgeon: Kathie Rhodes, MD;  Location: [facility];  Service: Urology;  Laterality: Right;   ORCHIECTOMY Left 09/02/2018   Procedure: ORCHIECTOMY;  Surgeon: Kathie Rhodes, MD;  Location[facility];  Service: Urology;  Laterality: Left;   ROTATOR CUFF REPAIR Right 12/2004   TRANSURETHRAL RESECTION OF BLADDER TUMOR N/A 08/09/2012   Procedure: TRANSURETHRAL RESECTION OF BLADDER TUMOR (TURBT) WITH GYRUS WITH MITOMYCIN C;  Surgeon: Claybon Jabs, MD;  Location: [facility];  Service: Urology;  Laterality: N/A;   TRANSURETHRAL RESECTION OF BLADDER TUMOR N/A 03/29/2020   Procedure: TRANSURETHRAL RESECTION OF BLADDER TUMOR (TURBT) and post-op instillation of gemcitabine;  Surgeon: Janith Lima, MD;  Location: [facility];  Service: Urology;  Laterality: N/A;   TRANSURETHRAL RESECTION OF BLADDER TUMOR WITH GYRUS (TURBT-GYRUS) N/A 02/27/2014   Procedure: TRANSURETHRAL RESECTION OF BLADDER TUMOR WITH GYRUS (TURBT-GYRUS);  Surgeon: Claybon Jabs, MD;  Location: [facility];  Service: Urology;  Laterality: N/A;   Patient Active Problem List   Diagnosis Date Noted   Peripheral neuropathy in hands 06/08/2022   Hyperosmolar hyperglycemic state (HHS) 05/24/2022   Essential hypertension 05/23/2022   BPH (benign prostatic hyperplasia) 05/23/2022   Diabetic hyperosmolar non-ketotic state 04/05/2022   AKI (acute kidney injury) 09/04/2021   Hypotension 09/03/2021   History of seizure [id number]   Acute metabolic encephalopathy [id number]   Suicide attempt by drug overdose 07/28/2021   Cerebral thrombosis with cerebral infarction 11/16/2020   Stroke-like symptoms 11/14/2020   Hyperkalemia 11/14/2020   Ischemic stroke 06/10/2020   Hyperglycemia    Gangrene of  finger of left hand    Pain due to onychomycosis of toenails of both feet 01/28/2020   Hyperglycemia due to type 2 diabetes mellitus 01/07/2020   Resistance to insulin 01/07/2020   Right leg weakness 04/24/2019   Right leg pain 04/24/2019   Gait abnormality 04/24/2019   TIA (transient ischemic attack) 10/20/2018   DM2 (diabetes mellitus, type 2) 10/19/2018   Left sided numbness 10/19/2018   Diabetic autonomic neuropathy associated with type 2 diabetes mellitus 02/01/2018   DM type 2 with diabetic peripheral neuropathy 02/01/2018   Dyslipidemia 01/31/2018   Coronary artery disease involving native coronary artery of native heart without angina pectoris 01/31/2018   Chest pain 01/01/2018   Breakthrough seizure 12/19/2016   Essential tremor 12/19/2016   Chronic post-traumatic stress disorder (PTSD) 06/16/2016   Tremor 06/16/2016   Tobacco abuse 05/05/2016   Altered mental status 05/28/2015  Acute bronchitis 05/28/2015   Type 2 diabetes mellitus with hyperglycemia 05/28/2015   Hypothyroidism 05/28/2015   Bipolar 2 disorder 05/28/2015   History of rheumatoid arthritis 05/28/2015   Chronic pain 05/28/2015   Bipolar II disorder, most recent episode major depressive 03/18/2015   Carpal tunnel syndrome on left 02/10/2015   Carpal tunnel syndrome on right 02/10/2015   Diabetes 01/23/2015   Obesity (BMI 30-39.9) 03/06/2014   Acute encephalopathy 03/05/2014   Hyperlipidemia 03/05/2014   Anemia, normocytic normochromic 03/05/2014   Altered mental state    Helicobacter pylori (H. pylori) infection 11/20/2012   Protein-calorie malnutrition, severe 10/09/2012   Dysphagia 08/19/2012   Gastroparesis 08/19/2012   Bladder tumor 08/08/2012   Biliary dyskinesia 11/02/2010    ONSET DATE: 05/26/2022  REFERRING DIAG: R73.9 (ICD-10-CM) - Hyperglycemia R29.898 (ICD-10-CM) - Right leg weakness R20.0 (ICD-10-CM) - Left sided numbness  THERAPY DIAG:  Unsteadiness on feet  Other abnormalities  of gait and mobility  Rationale for Evaluation and Treatment: Rehabilitation  SUBJECTIVE:                                                                                                                                                                                             SUBJECTIVE STATEMENT: Patient arrives to session with SPC. States he had his rollator in the car but was rushing to get in on time. Patient reports that he had an allergic reaction to medication he was started on and has scratched his legs raw bilaterally due to not being able to feel how hard he was scratching. Patient reports that his legs are feeling pretty weak today. Patient denies falls/near falls.  Pt accompanied by: self  PERTINENT HISTORY: PMH: HTN, diabetes mellitus type 2 who is noncompliant with medication, HTN, HLD, Acute Kidney Injury, Hypokalemia, bipolar disorder, rheumatoid arthritis, diabetic peripheral neuropathy, hx of TIAs, tobacco abuse, hx of CVA (pt reports it affected his L side)  Per Dr. Riley Kill note in hospital: Pt with poorly controlled diabetes who presented on 05/23/22 weakness, malaise, and diarrhea. He was found to have a CBG (Capillary blood glucose) of 1184 and a Na+ of 121. Hgb A1C was 14.1! He was placed on IVF fluids and insulin and sugars are better controlled but sill remain in the 300's. Pt c/o numbness in hands and distal lower extremities as well as ongoing weakness, left > right. The weakness is not all entirely new as he has been experiencing symptoms for several months apparently. Prior to arrival he ambulating with a walker and could perform ADL's   PAIN:  Are you having pain? Yes: NPRS scale: 7 (neuropathy pain +  where he scratched)/10 Pain location: Lower legs, arms Pain description: Constant, sharp  Aggravating factors: Nothing, stays the same unless getting exerted  Relieving factors: Not really   Vitals:   07/03/22 1324  BP: (!) 144/76  Pulse: 88   PRECAUTIONS:  Fall  WEIGHT BEARING RESTRICTIONS: No  FALLS: Has patient fallen in last 6 months? Yes. Number of falls 8, pt reports he has banged himself pretty good with these   LIVING ENVIRONMENT: Lives with:  girlfriend lives there 3x a week  Lives in: Mobile home Stairs: Yes: External: 6 steps; bilateral but cannot reach both Has following equipment at home: Single point cane, Walker - 2 wheeled, Environmental consultant - 4 wheeled, and shower chair  PLOF: Independent, Vocation/Vocational requirements: On disability (has been for years), and Leisure: going to [employer] races  pt reports he is still driving, Pt's girlfriend helps pt get dressed, take a shower, cooking him dinner, chores   PATIENT GOALS: Wants to get legs stronger, get better at feeding himself and feeling in hands   OBJECTIVE:   DIAGNOSTIC FINDINGS: CT head 05/23/22: IMPRESSION: No acute intracranial findings.  No CT evidence of acute infarction.    COGNITION: Overall cognitive status: Within functional limits for tasks assessed   TODAY'S TREATMENT:                                                                                                                               Therapeutic Activity:  [facility] PT Assessment - 07/03/22 0001       Berg Balance Test   Sit to Stand Able to stand  independently using hands    Standing Unsupported Able to stand 30 seconds unsupported    Sitting with Back Unsupported but Feet Supported on Floor or Stool Able to sit safely and securely 2 minutes    Stand to Sit Controls descent by using hands    Transfers Able to transfer with verbal cueing and /or supervision    Standing Unsupported with Eyes Closed Needs help to keep from falling    Standing Unsupported with Feet Together Needs help to attain position and unable to hold for 15 seconds    From Standing, Reach Forward with Outstretched Arm Loses balance while trying/requires external support    From Standing Position, Pick up Object from Floor Unable to pick  up and needs supervision    From Standing Position, Turn to Look Behind Over each Shoulder Needs supervision when turning    Turn 360 Degrees Needs close supervision or verbal cueing    Standing Unsupported, Alternately Place Feet on Step/Stool Able to complete >2 steps/needs minimal assist    Standing Unsupported, One Foot in Front Needs help to step but can hold 15 seconds    Standing on One Leg Unable to try or needs assist to prevent fall    Total Score 19    Berg comment: improved stability and required less assistance from therapist to prevent falls  Self Care: Discussed possibility of ramp; patient reports that he is discussing with his landlords if they can add in a ramp over the last few weeks and should know more soon. Also discussed regular skin checks with new wounds on LE to avoid infections. Held on AFO trials today due to open wounds. Patient verbalized understanding and appropriate understanding of precautions.   PATIENT EDUCATION: Education details: Berg results/interpretation + self care noted above, continue HEP Person educated: Patient Education method: Medical illustrator Education comprehension: verbalized understanding, returned demonstration, and verbal cues required  HOME EXERCISE PROGRAM: Access Code: [email or web address] URL: [email or web address] Date: 06/26/2022 Prepared by: Sherlie Ban  Exercises - Clamshell  - 1 x daily - 7 x weekly - 2 sets - 10-20 reps - Sitting Knee Extension with Resistance  - 1 x daily - 7 x weekly - 2 sets - 10 reps - Supine Bridge with Resistance Band  - 1 x daily - 7 x weekly - 3 sets - 12 reps - Hooklying Single Leg Bent Knee Fallouts with Resistance  - 1 x daily - 7 x weekly - 3 sets - 12 reps - Supine March with Resistance Band  - 1 x daily - 7 x weekly - 2 sets - 20 reps - Mini Squats with Walker and Chair  - 1-2 x daily - 7 x weekly - 2 sets - 5 reps - Sit to Stand with Armchair  - 1-2 x daily  - 5 x weekly - 2 sets - 5 reps  GOALS: Goals reviewed with patient? Yes  SHORT TERM GOALS: Target date: 06/27/2022  Pt will be independent with initial HEP in order to build upon functional gains made in therapy. Baseline: patient reports doing as much as he can at one times, he takes breaks as need Goal status: MET  2.  Pt will improve BERG to at least a 18/56 in order to demo decr fall risk.  Baseline: 11/56; improved to 19/56 Goal status: MET  3.  Pt will verbalize understanding of fall prevention in the home.  Baseline:  Goal status: MET  4.  Pt will improve gait speed with RW vs. LRAD to at least 1.3 ft/sec in order to demo decr fall risk.  Baseline:  33.34 seconds with RW = .98 ft/sec   20.82 seconds with rollator = 1.57 ft/sec Goal status: MET  5.  Pt will improve 5x sit<>stand to less than or equal to 44 sec with BUE support to demonstrate improved functional strength and transfer efficiency.   Baseline:  53.62 seconds with BUE support, unable to come fully to stand during last 2 reps, pt very fatigued afterwards    28.7 seconds with BUE support on 06/26/22 Goal status: MET    LONG TERM GOALS: Target date: 07/25/2022  Pt will be independent with final HEP in order to build upon functional gains made in therapy. Baseline:  Goal status: INITIAL  2.  Pt will improve BERG to at least a 28/56 in order to demo decr fall risk. Baseline: 11/56 Goal status: INITIAL  3.  Pt will go up and down 6 steps with step to pattern and single handrail with mod I in order to safely get in and out of his home.  Baseline: pt reports having a fall down his steps.  Goal status: INITIAL  4.   Pt will improve 5x sit<>stand to less than or equal to 25 sec with BUE support to demonstrate improved functional strength and transfer efficiency.  Baseline:  53.62 seconds with BUE support, unable to come fully to stand during last 2 reps, pt very fatigued afterwards   Goal status: REVISED  5.     Pt will improve gait speed with RW vs. LRAD to at least 2.0 ft/sec in order to demo improved community mobility.  Baseline: 33.34 seconds with RW = .98 ft/sec  Goal status: INITIAL    ASSESSMENT:  CLINICAL IMPRESSION: Session emphasized assessment of Berg in addition to discussion of home modifications and considerations with new LE wounds. Patient improved on Berg from 11/56 to 19/56; patient is still at an increased risk for falls but is demonstrating improved tolerance for trialing activities and self correcting. Still requires frequnet breaks due to LE fatigue. Will continue to Will continue to progress towards LTGs.   OBJECTIVE IMPAIRMENTS: Abnormal gait, decreased activity tolerance, decreased balance, decreased coordination, decreased endurance, decreased knowledge of condition, decreased knowledge of use of DME, decreased mobility, difficulty walking, decreased ROM, decreased strength, decreased safety awareness, impaired flexibility, impaired sensation, impaired UE functional use, postural dysfunction, and pain.   ACTIVITY LIMITATIONS: carrying, lifting, standing, stairs, transfers, bathing, dressing, self feeding, hygiene/grooming, and locomotion level  PARTICIPATION LIMITATIONS: meal prep, cleaning, laundry, shopping, and community activity  PERSONAL FACTORS: Age, Behavior pattern, Past/current experiences, Time since onset of injury/illness/exacerbation, and 3+ comorbidities: HTN, diabetes mellitus type 2 who is noncompliant with medication, HTN, HLD, Acute Kidney Injury, Hypokalemia, bipolar disorder, rheumatoid arthritis, diabetic peripheral neuropathy, hx of TIAs, tobacco abuse, hx of CVA (pt reports it affected his L side)  are also affecting patient's functional outcome.   REHAB POTENTIAL: Good  CLINICAL DECISION MAKING: Evolving/moderate complexity  EVALUATION COMPLEXITY: Moderate  PLAN:  PT FREQUENCY: 2x/week  PT DURATION: 8 weeks  PLANNED INTERVENTIONS: Therapeutic  exercises, Therapeutic activity, Neuromuscular re-education, Balance training, Gait training, Patient/Family education, Self Care, Stair training, Vestibular training, Visual/preceptual remediation/compensation, Orthotic/Fit training, DME instructions, Manual therapy, and Re-evaluation  PLAN FOR NEXT SESSION: strength, balance as able, proximal stability/dynamic stepping reaction (tolerate tall kneeling or quadruped for strengthening?), standing tolerance, stepping reaction with UE support, type of bracing to prevent knee buckling, continue with AFO, C brace candidate? Pending healing of LE, possibly assess stairs  Carmelia Bake, PT, DPT  07/03/2022, 2:06 PM

## 2022-07-03 NOTE — Patient Instructions (Signed)
Sensory Diet Brush, bounce, tap items on affected side. Vary items that are soft and those that are coarse. Use vibration over arm using back massager, etc. Place in container of rice or beans and move hand around in it. You can also place items in the container and try to locate them without looking. Place weight trough your arm while supported on table or in your lap.  Complete for 5 minutes, twice daily.

## 2022-07-06 ENCOUNTER — Telehealth: Payer: Self-pay | Admitting: Physical Therapy

## 2022-07-06 ENCOUNTER — Ambulatory Visit: Payer: Medicare HMO | Admitting: Physical Therapy

## 2022-07-06 NOTE — Telephone Encounter (Signed)
Attempted to call patient regarding no show to appointment however mail box was full and was unable to leave a VM at this time. Patient on 2nd no show and 1 cancellation; recommend discussing no show/cancellation policy next session as well as strategies to improve compliance.   Maryruth Eve, PT, DPT

## 2022-07-10 ENCOUNTER — Ambulatory Visit: Payer: Medicare HMO | Admitting: Physical Therapy

## 2022-07-10 ENCOUNTER — Telehealth: Payer: Self-pay | Admitting: Physical Therapy

## 2022-07-10 ENCOUNTER — Ambulatory Visit: Payer: Medicare HMO | Admitting: Occupational Therapy

## 2022-07-10 NOTE — Telephone Encounter (Signed)
Attempted to call patient regarding no show to appointment however mail box was full and was unable to leave a VM at this time. Patient on 2nd no show appt in a row.  Sherlie Ban, PT, DPT 07/10/22 11:19 AM   [facility] [address] [place], [place]  [address] Phone:  [phone] Fax:  [phone]

## 2022-07-13 ENCOUNTER — Ambulatory Visit: Payer: Medicare HMO | Attending: Internal Medicine | Admitting: Physical Therapy

## 2022-07-17 ENCOUNTER — Ambulatory Visit: Payer: Medicare HMO | Admitting: Physical Therapy

## 2022-07-17 ENCOUNTER — Ambulatory Visit: Payer: Medicare HMO | Admitting: Occupational Therapy

## 2022-07-18 ENCOUNTER — Emergency Department (HOSPITAL_COMMUNITY): Payer: Medicare HMO

## 2022-07-18 ENCOUNTER — Inpatient Hospital Stay (HOSPITAL_COMMUNITY)
Admission: EM | Admit: 2022-07-18 | Discharge: 2022-07-20 | DRG: 312 | Disposition: A | Discharge status: Home / Self Care | Payer: Medicare HMO | Attending: Internal Medicine | Admitting: Internal Medicine

## 2022-07-18 ENCOUNTER — Other Ambulatory Visit: Payer: Self-pay

## 2022-07-18 ENCOUNTER — Encounter (HOSPITAL_COMMUNITY): Payer: Self-pay

## 2022-07-18 DIAGNOSIS — Z79891 Long term (current) use of opiate analgesic: Secondary | ICD-10-CM

## 2022-07-18 DIAGNOSIS — G894 Chronic pain syndrome: Secondary | ICD-10-CM | POA: Diagnosis present

## 2022-07-18 DIAGNOSIS — I2510 Atherosclerotic heart disease of native coronary artery without angina pectoris: Secondary | ICD-10-CM | POA: Diagnosis present

## 2022-07-18 DIAGNOSIS — M069 Rheumatoid arthritis, unspecified: Secondary | ICD-10-CM | POA: Diagnosis present

## 2022-07-18 DIAGNOSIS — G4700 Insomnia, unspecified: Secondary | ICD-10-CM | POA: Diagnosis present

## 2022-07-18 DIAGNOSIS — Z881 Allergy status to other antibiotic agents status: Secondary | ICD-10-CM

## 2022-07-18 DIAGNOSIS — Z833 Family history of diabetes mellitus: Secondary | ICD-10-CM

## 2022-07-18 DIAGNOSIS — Z8673 Personal history of transient ischemic attack (TIA), and cerebral infarction without residual deficits: Secondary | ICD-10-CM

## 2022-07-18 DIAGNOSIS — F17210 Nicotine dependence, cigarettes, uncomplicated: Secondary | ICD-10-CM | POA: Diagnosis present

## 2022-07-18 DIAGNOSIS — Z888 Allergy status to other drugs, medicaments and biological substances status: Secondary | ICD-10-CM

## 2022-07-18 DIAGNOSIS — R55 Syncope and collapse: Principal | ICD-10-CM

## 2022-07-18 DIAGNOSIS — Z79899 Other long term (current) drug therapy: Secondary | ICD-10-CM

## 2022-07-18 DIAGNOSIS — I10 Essential (primary) hypertension: Secondary | ICD-10-CM | POA: Diagnosis present

## 2022-07-18 DIAGNOSIS — N179 Acute kidney failure, unspecified: Secondary | ICD-10-CM | POA: Diagnosis present

## 2022-07-18 DIAGNOSIS — E43 Unspecified severe protein-calorie malnutrition: Secondary | ICD-10-CM | POA: Diagnosis present

## 2022-07-18 DIAGNOSIS — F4310 Post-traumatic stress disorder, unspecified: Secondary | ICD-10-CM | POA: Diagnosis present

## 2022-07-18 DIAGNOSIS — K3184 Gastroparesis: Secondary | ICD-10-CM | POA: Diagnosis present

## 2022-07-18 DIAGNOSIS — E039 Hypothyroidism, unspecified: Secondary | ICD-10-CM | POA: Diagnosis present

## 2022-07-18 DIAGNOSIS — G4733 Obstructive sleep apnea (adult) (pediatric): Secondary | ICD-10-CM | POA: Diagnosis present

## 2022-07-18 DIAGNOSIS — J449 Chronic obstructive pulmonary disease, unspecified: Secondary | ICD-10-CM | POA: Diagnosis present

## 2022-07-18 DIAGNOSIS — G40909 Epilepsy, unspecified, not intractable, without status epilepticus: Secondary | ICD-10-CM | POA: Diagnosis present

## 2022-07-18 DIAGNOSIS — Z8551 Personal history of malignant neoplasm of bladder: Secondary | ICD-10-CM

## 2022-07-18 DIAGNOSIS — E1165 Type 2 diabetes mellitus with hyperglycemia: Secondary | ICD-10-CM | POA: Diagnosis present

## 2022-07-18 DIAGNOSIS — G8929 Other chronic pain: Secondary | ICD-10-CM | POA: Diagnosis present

## 2022-07-18 DIAGNOSIS — E1142 Type 2 diabetes mellitus with diabetic polyneuropathy: Secondary | ICD-10-CM | POA: Diagnosis present

## 2022-07-18 DIAGNOSIS — Z87898 Personal history of other specified conditions: Secondary | ICD-10-CM

## 2022-07-18 DIAGNOSIS — Z882 Allergy status to sulfonamides status: Secondary | ICD-10-CM

## 2022-07-18 DIAGNOSIS — Z22322 Carrier or suspected carrier of Methicillin resistant Staphylococcus aureus: Secondary | ICD-10-CM

## 2022-07-18 DIAGNOSIS — F419 Anxiety disorder, unspecified: Secondary | ICD-10-CM | POA: Diagnosis present

## 2022-07-18 DIAGNOSIS — E1143 Type 2 diabetes mellitus with diabetic autonomic (poly)neuropathy: Secondary | ICD-10-CM | POA: Diagnosis present

## 2022-07-18 DIAGNOSIS — E785 Hyperlipidemia, unspecified: Secondary | ICD-10-CM | POA: Diagnosis present

## 2022-07-18 DIAGNOSIS — Z885 Allergy status to narcotic agent status: Secondary | ICD-10-CM

## 2022-07-18 DIAGNOSIS — Z9079 Acquired absence of other genital organ(s): Secondary | ICD-10-CM

## 2022-07-18 DIAGNOSIS — F3181 Bipolar II disorder: Secondary | ICD-10-CM | POA: Diagnosis present

## 2022-07-18 DIAGNOSIS — Z89022 Acquired absence of left finger(s): Secondary | ICD-10-CM

## 2022-07-18 DIAGNOSIS — Z8249 Family history of ischemic heart disease and other diseases of the circulatory system: Secondary | ICD-10-CM

## 2022-07-18 DIAGNOSIS — E860 Dehydration: Secondary | ICD-10-CM | POA: Diagnosis present

## 2022-07-18 DIAGNOSIS — Z794 Long term (current) use of insulin: Secondary | ICD-10-CM

## 2022-07-18 DIAGNOSIS — Z7902 Long term (current) use of antithrombotics/antiplatelets: Secondary | ICD-10-CM

## 2022-07-18 DIAGNOSIS — I959 Hypotension, unspecified: Secondary | ICD-10-CM | POA: Diagnosis present

## 2022-07-18 DIAGNOSIS — K219 Gastro-esophageal reflux disease without esophagitis: Secondary | ICD-10-CM | POA: Diagnosis present

## 2022-07-18 LAB — BLOOD GAS, VENOUS
Acid-Base Excess: 0.3 mmol/L (ref 0.0–2.0)
Bicarbonate: 25.4 mmol/L (ref 20.0–28.0)
O2 Saturation: 29.7 %
Patient temperature: 37.1
pCO2, Ven: 42 mmHg — ABNORMAL LOW (ref 44–60)
pH, Ven: 7.39 (ref 7.25–7.43)
pO2, Ven: <31 mmHg — CL (ref 32–45)

## 2022-07-18 LAB — CBC WITH DIFFERENTIAL/PLATELET
Abs Immature Granulocytes: 0.28 10*3/uL — ABNORMAL HIGH (ref 0.00–0.07)
Basophils Absolute: 0.1 10*3/uL (ref 0.0–0.1)
Basophils Relative: 1 %
Eosinophils Absolute: 0.1 10*3/uL (ref 0.0–0.5)
Eosinophils Relative: 1 %
HCT: 36.8 % — ABNORMAL LOW (ref 39.0–52.0)
Hemoglobin: 12.7 g/dL — ABNORMAL LOW (ref 13.0–17.0)
Immature Granulocytes: 3 %
Lymphocytes Relative: 29 %
Lymphs Abs: 3.2 10*3/uL (ref 0.7–4.0)
MCH: 28.3 pg (ref 26.0–34.0)
MCHC: 34.5 g/dL (ref 30.0–36.0)
MCV: 82.1 fL (ref 80.0–100.0)
Monocytes Absolute: 0.6 10*3/uL (ref 0.1–1.0)
Monocytes Relative: 5 %
Neutro Abs: 6.8 10*3/uL (ref 1.7–7.7)
Neutrophils Relative %: 61 %
Platelets: 347 10*3/uL (ref 150–400)
RBC: 4.48 MIL/uL (ref 4.22–5.81)
RDW: 12.3 % (ref 11.5–15.5)
WBC: 11.1 10*3/uL — ABNORMAL HIGH (ref 4.0–10.5)
nRBC: 0 % (ref 0.0–0.2)

## 2022-07-18 LAB — TROPONIN I (HIGH SENSITIVITY)
Troponin I (High Sensitivity): 3 ng/L (ref <18)
Troponin I (High Sensitivity): 2 ng/L (ref <18)

## 2022-07-18 LAB — CK: Total CK: 64 U/L (ref 49–397)

## 2022-07-18 LAB — COMPREHENSIVE METABOLIC PANEL
ALT: 13 U/L (ref 0–44)
AST: 13 U/L — ABNORMAL LOW (ref 15–41)
Albumin: 3.3 g/dL — ABNORMAL LOW (ref 3.5–5.0)
Alkaline Phosphatase: 83 U/L (ref 38–126)
Anion gap: 9 (ref 5–15)
BUN: 28 mg/dL — ABNORMAL HIGH (ref 6–20)
CO2: 23 mmol/L (ref 22–32)
Calcium: 8.8 mg/dL — ABNORMAL LOW (ref 8.9–10.3)
Chloride: 101 mmol/L (ref 98–111)
Creatinine, Ser: 1.83 mg/dL — ABNORMAL HIGH (ref 0.61–1.24)
GFR, Estimated: 43 mL/min — ABNORMAL LOW (ref >60)
Glucose, Bld: 195 mg/dL — ABNORMAL HIGH (ref 70–99)
Potassium: 4.2 mmol/L (ref 3.5–5.1)
Sodium: 133 mmol/L — ABNORMAL LOW (ref 135–145)
Total Bilirubin: 0.4 mg/dL (ref 0.3–1.2)
Total Protein: 7 g/dL (ref 6.5–8.1)

## 2022-07-18 LAB — TSH: TSH: 1.479 u[IU]/mL (ref 0.350–4.500)

## 2022-07-18 MED ORDER — ENOXAPARIN SODIUM 40 MG/0.4ML IJ SOSY
40.0000 mg | PREFILLED_SYRINGE | INTRAMUSCULAR | Status: DC
  Administered 2022-07-18 – 2022-07-19 (×2): 40 mg via SUBCUTANEOUS
  Filled 2022-07-18 (×2): qty 0.4

## 2022-07-18 MED ORDER — ONDANSETRON HCL 4 MG/2ML IJ SOLN: 4.0000 mg | Freq: Four times a day (QID) | INTRAMUSCULAR | Status: DC | PRN

## 2022-07-18 MED ORDER — ROSUVASTATIN CALCIUM 20 MG PO TABS
10.0000 mg | ORAL_TABLET | Freq: Every day | ORAL | Status: DC
  Administered 2022-07-18 – 2022-07-19 (×2): 10 mg via ORAL
  Filled 2022-07-18 (×3): qty 1

## 2022-07-18 MED ORDER — HYDROXYCHLOROQUINE SULFATE 200 MG PO TABS
200.0000 mg | ORAL_TABLET | Freq: Every day | ORAL | Status: DC
  Administered 2022-07-18 – 2022-07-20 (×3): 200 mg via ORAL
  Filled 2022-07-18 (×3): qty 1

## 2022-07-18 MED ORDER — SODIUM CHLORIDE 0.9 % IV BOLUS
1000.0000 mL | Freq: Once | INTRAVENOUS | Status: AC
  Administered 2022-07-18: 1000 mL via INTRAVENOUS

## 2022-07-18 MED ORDER — LACTATED RINGERS IV SOLN: INTRAVENOUS | Status: DC

## 2022-07-18 MED ORDER — DULOXETINE HCL 30 MG PO CPEP
60.0000 mg | ORAL_CAPSULE | Freq: Every day | ORAL | Status: DC
  Administered 2022-07-18 – 2022-07-20 (×3): 60 mg via ORAL
  Filled 2022-07-18 (×3): qty 2

## 2022-07-18 MED ORDER — INSULIN ASPART 100 UNIT/ML IJ SOLN
0.0000 [IU] | Freq: Every day | INTRAMUSCULAR | Status: DC
  Administered 2022-07-19: 2 [IU] via SUBCUTANEOUS

## 2022-07-18 MED ORDER — ONDANSETRON HCL 4 MG PO TABS: 4.0000 mg | ORAL_TABLET | Freq: Four times a day (QID) | ORAL | Status: DC | PRN

## 2022-07-18 MED ORDER — INSULIN ASPART 100 UNIT/ML IJ SOLN
0.0000 [IU] | Freq: Three times a day (TID) | INTRAMUSCULAR | Status: DC
  Administered 2022-07-19: 5 [IU] via SUBCUTANEOUS
  Administered 2022-07-19 – 2022-07-20 (×2): 3 [IU] via SUBCUTANEOUS

## 2022-07-18 MED ORDER — MORPHINE SULFATE (PF) 2 MG/ML IV SOLN
2.0000 mg | INTRAVENOUS | Status: DC | PRN
  Administered 2022-07-19 (×2): 2 mg via INTRAVENOUS
  Filled 2022-07-18 (×3): qty 1

## 2022-07-18 NOTE — ED Provider Notes (Signed)
[place] [facility] AT [facility] Provider Note   CSN: [id number] Arrival date & time: 07/18/22  1318     History {Add pertinent medical, surgical, social history, OB history to HPI:1} Chief Complaint  Patient presents with   Dizziness    Vincent [name] is a 57 y.o. male with history of type 2 diabetes on insulin, hypertension, hyperlipidemia, presenting to the ED with lightheadedness and hypotension.  The patient was reportedly at his PCPs office today where there was concern that his blood pressure was very low, 60/40's per EMS report.  EMS states BP 120 systolic on their arrival & they gave patient 200 cc fluid en route to hospital.  The patient reports to me that for the past 2 to 3 days he has noticed that he is extremely lightheaded, specifically with standing up.  He said he has had 2 falls and he hit the back of his head yesterday.  He reports me that he drinks about 6 glasses of water today, as well as about 3 glasses of sweet tea.  Patient was admitted to the hospital most recently in March 2024 per my review of external records, at that time for hyperosmolar hyperglycemic state related to noncompliance with medications, with BS 1184.  A1c was 15.5.  The patient reports that since then he has been extremely compliant with his medications and his insulin.  His sister moved in with him and has been helping to take care of him full-time now.    HPI     Home Medications Prior to Admission medications   Medication Sig Start Date End Date Taking? Authorizing Provider  acetaminophen (TYLENOL) 500 MG tablet Take 1,000 mg by mouth every 4 (four) hours as needed for moderate pain or headache.    [provider]  clopidogrel (PLAVIX) 75 MG tablet Take 1 tablet (75 mg total) by mouth daily. Patient not taking: Reported on 05/24/2022 10/12/21   Glean Salvo, NP  dicyclomine (BENTYL) 20 MG tablet Take 1 tablet (20 mg total) by mouth 2 (two) times daily as  needed (abdominal cramping). 06/21/22   Mardene Sayer, MD  DULoxetine (CYMBALTA) 60 MG capsule Take 1 capsule (60 mg total) by mouth daily. 10/12/21   Glean Salvo, NP  famotidine (PEPCID) 20 MG tablet Take 1 tablet (20 mg total) by mouth 2 (two) times daily. 06/21/22   Mardene Sayer, MD  fenofibrate 160 MG tablet Take 160 mg by mouth at bedtime. 12/31/19   [provider]  HUMALOG KWIKPEN 200 UNIT/ML KwikPen Inject 10 Units into the skin in the morning, at noon, and at bedtime. 06/17/21   [provider]  HYDROcodone-acetaminophen (NORCO/VICODIN) 5-325 MG tablet Take by mouth. 06/19/22   [provider]  hydroxychloroquine (PLAQUENIL) 200 MG tablet Take 200 mg by mouth daily. 04/27/22   [provider]  insulin glargine, 2 Unit Dial, (TOUJEO MAX SOLOSTAR) 300 UNIT/ML Solostar Pen Inject 100 Units into the skin daily. 05/27/22   Calvert Cantor, MD  lamoTRIgine (LAMICTAL) 150 MG tablet Take 1 tablet (150 mg total) by mouth 2 (two) times daily. 10/12/21   Glean Salvo, NP  losartan (COZAAR) 25 MG tablet Take 25 mg by mouth daily. 03/14/22   [provider]  lurasidone (LATUDA) 20 MG TABS tablet Take 20 mg by mouth at bedtime.    [provider]  lurasidone (LATUDA) 80 MG TABS tablet Take 1 tablet (80 mg total) by mouth daily with breakfast. Patient  not taking: Reported on 05/24/2022 01/25/22   Arfeen, Phillips Grout, MD  omeprazole (PRILOSEC) 20 MG capsule Take 1 capsule (20 mg total) by mouth daily. 06/21/22 07/21/22  Mardene Sayer, MD  omeprazole (PRILOSEC) 40 MG capsule Take 40 mg by mouth daily. 08/23/21   [provider]  ondansetron (ZOFRAN-ODT) 4 MG disintegrating tablet Take 1 tablet (4 mg total) by mouth every 8 (eight) hours as needed. 06/21/22   Mardene Sayer, MD  pregabalin (LYRICA) 200 MG capsule Take 200-400 mg by mouth in the morning and at bedtime. Take 200 mg by mouth in the morning and 400 mg by mouth at night 12/25/21    Dois Davenport, MD  rosuvastatin (CRESTOR) 10 MG tablet Take 1 tablet (10 mg total) by mouth daily. Patient taking differently: Take 10 mg by mouth at bedtime. 08/04/21 05/24/22  Lauro Franklin, MD  sucralfate (CARAFATE) 1 g tablet Take 1 g by mouth 2 (two) times daily. 06/12/22   [provider]  tamsulosin (FLOMAX) 0.4 MG CAPS capsule Take 0.4 mg by mouth at bedtime.    [provider]  traZODone (DESYREL) 150 MG tablet Take 1 tablet (150 mg total) by mouth at bedtime as needed for sleep. Patient not taking: Reported on 05/24/2022 01/25/22   Cleotis Nipper, MD  venlafaxine XR (EFFEXOR-XR) 150 MG 24 hr capsule Take 1 capsule (150 mg total) by mouth daily with breakfast. 01/25/22   Arfeen, Phillips Grout, MD  Vitamin D, Ergocalciferol, (DRISDOL) 1.25 MG (50000 UNIT) CAPS capsule Take 50,000 Units by mouth every Wednesday. 12/26/19   [provider]      Allergies    Celecoxib, Hydrocodone, Sulfa antibiotics, and Sulfacetamide sodium    Review of Systems   Review of Systems  Physical Exam Updated Vital Signs There were no vitals taken for this visit. Physical Exam Constitutional:      General: He is not in acute distress. HENT:     Head: Normocephalic and atraumatic.  Eyes:     Conjunctiva/sclera: Conjunctivae normal.     Pupils: Pupils are equal, round, and reactive to light.  Cardiovascular:     Rate and Rhythm: Normal rate and regular rhythm.  Pulmonary:     Effort: Pulmonary effort is normal. No respiratory distress.  Abdominal:     General: There is no distension.     Tenderness: There is no abdominal tenderness.  Skin:    General: Skin is warm and dry.  Neurological:     General: No focal deficit present.     Mental Status: He is alert and oriented to person, place, and time. Mental status is at baseline.  Psychiatric:        Mood and Affect: Mood normal.        Behavior: Behavior normal.     ED Results / Procedures / Treatments   Labs (all  labs ordered are listed, but only abnormal results are displayed) Labs Reviewed - No data to display  EKG None  Radiology No results found.  Procedures Procedures  {Document cardiac monitor, telemetry assessment procedure when appropriate:1}  Medications Ordered in ED Medications  sodium chloride 0.9 % bolus 1,000 mL (has no administration in time range)    ED Course/ Medical Decision Making/ A&P   {   Click here for ABCD2, HEART and other calculatorsREFRESH Note before signing :1}  Medical Decision Making Amount and/or Complexity of Data Reviewed Labs: ordered. Radiology: ordered. ECG/medicine tests: ordered.   This patient presents to the ED with concern for hypotension, lightheadedness. This involves an extensive number of treatment options, and is a complaint that carries with it a high risk of complications and morbidity.  The differential diagnosis includes patient versus infection versus other  Co-morbidities that complicate the patient evaluation: History of electrolyte derangement and abnormalities, diabetes, at high risk of similar complications  Additional history obtained from EMS  External records from outside source obtained and reviewed including hospital discharge summary March 2024  I ordered and personally interpreted labs.  The pertinent results include:  ***  I ordered imaging studies including CT head I independently visualized and interpreted imaging which showed *** I agree with the radiologist interpretation  The patient was maintained on a cardiac monitor.  I personally viewed and interpreted the cardiac monitored which showed an underlying rhythm of: ***  Per my interpretation the patient's ECG shows ***  I ordered medication including ***  for *** I have reviewed the patients home medicines and have made adjustments as needed  Test Considered: ***  I requested consultation with the ***,  and discussed lab and  imaging findings as well as pertinent plan - they recommend: ***  After the interventions noted above, I reevaluated the patient and found that they have: {resolved/improved/worsened:23923::"improved"}  Social Determinants of Health:***  Dispostion:  After consideration of the diagnostic results and the patients response to treatment, I feel that the patent would benefit from ***.   {Document critical care time when appropriate:1} {Document review of labs and clinical decision tools ie heart score, Chads2Vasc2 etc:1}  {Document your independent review of radiology images, and any outside records:1} {Document your discussion with family members, caretakers, and with consultants:1} {Document social determinants of health affecting pt's care:1} {Document your decision making why or why not admission, treatments were needed:1} Final Clinical Impression(s) / ED Diagnoses Final diagnoses:  None    Rx / DC Orders ED Discharge Orders     None

## 2022-07-18 NOTE — ED Notes (Signed)
Attempted to ambulate patient and patient states he feels to weak

## 2022-07-18 NOTE — H&P (Signed)
History and Physical    Patient: Vincent [name] ZOX:[id number] DOB: [birth year] DOA: 07/18/2022 DOS: the patient was seen and examined on 07/18/2022 PCP: Dois Davenport, MD  Patient coming from: {Point_of_Origin:[address]}  Chief Complaint:  Chief Complaint  Patient presents with   Dizziness   HPI: Vincent [name] is a 57 y.o. male with medical history significant of ***  Review of Systems: {ROS_Text:26778} Past Medical History:  Diagnosis Date   Anxiety    Benign essential tremor    Bipolar 2 disorder (HCC)    followed by [facility]--- dr s. Lolly Mustache   Bladder cancer [facility])    recurrent   CAD (coronary artery disease)    cardiac cath 2003  and 2011 both showed normal coronary arteries w/ preserved lvf;  Non obstructive on CTA Oct 2019.    Chronic pain syndrome    back---- [facility] [place]   Cold extremities    BLE   COPD (chronic obstructive pulmonary disease) (HCC)    DDD (degenerative disc disease), lumbar    Diabetic peripheral neuropathy (HCC)    Gastroparesis    followed by dr Marina Goodell   GERD (gastroesophageal reflux disease)    Hiatal hernia    History of bladder cancer urologist-  previously dr Ronal Fear;  now dr gay   papillay TCC (Ta G1)  s/p TURBT and chemo instillation 2014   History of chest pain 12/2017   heart cath normal   History of encephalopathy 05/27/2015   admission w/ acute encephalopathy thought to be secondary to pain meds and COPD   History of gastric ulcer    History of Helicobacter pylori infection    History of kidney stones    History of TIA (transient ischemic attack) 2008  and 10-19-2018    no residual's   History of traumatic head injury 2010   w/ LOC  per pt needed stitches, hit in head with a mower blade   Hyperlipidemia    Hypertension    Hypogonadism male    s/p  bilateral orchiectomy   Hypothyroidism    Insomnia    Mild obstructive sleep apnea    study in epic 12-04-2016, no cpap   PTSD (post-traumatic stress disorder)     chronic   PTSD (post-traumatic stress disorder)    RA (rheumatoid arthritis) (HCC)    followed by [facility]   Seizures, transient [facility]) neurologist-  dr Terrace Arabia--  differential dx complex partial seizure .vs.  mood disorder .vs.  pseudoseizure--  negative EEG's   confusion episodes and staring spells since 11/ 2015   (03-26-2020 per pt wife last seizure 10 /2021)   Transient confusion NEUOROLOGIST-  DR Terrace Arabia   Episodes since 11/ 2015--  neurologist dx  differential complex partial seizure  .vs. mood disorder . vs. pseudoseizure   Type 2 diabetes mellitus treated with insulin [facility])    endocrinologist--- dr Everardo All---  (03-26-2020 pt does not check blood sugar at home)   Past Surgical History:  Procedure Laterality Date   AMPUTATION Left 04/28/2020   Procedure: LEFT LITTLE FINGER AMPUTATION;  Surgeon: Nadara Mustard, MD;  Location: [facility] OR;  Service: Orthopedics;  Laterality: Left;   CARDIAC CATHETERIZATION  12-27-2001  DR Jacinto Halim  &  05-26-2009  DR Eldridge Dace   RESULTS FOR BOTH ARE NORMAL CORONARIES AND PERSERVED LVF/ EF 60%   CARPAL TUNNEL RELEASE Bilateral right 09-16-2003;  left ?   CARPAL TUNNEL RELEASE Left 02/25/2015   Procedure: LEFT CARPAL TUNNEL RELEASE;  Surgeon: Betha Loa, MD;  Location: [facility];  Service: Orthopedics;  Laterality: Left;   CYSTOSCOPY N/A 10/10/2012   Procedure: CYSTOSCOPY CLOT EVACUATION FULGERATION OF BLEEDERS ;  Surgeon: Garnett Farm, MD;  Location: [facility];  Service: Urology;  Laterality: N/A;   CYSTOSCOPY WITH BIOPSY N/A 11/26/2015   Procedure: CYSTOSCOPY WITH BIOPSY AND FULGURATION;  Surgeon: Ihor Gully, MD;  Location: [facility];  Service: Urology;  Laterality: N/A;   ESOPHAGOGASTRODUODENOSCOPY  2014   LAPAROSCOPIC CHOLECYSTECTOMY  11-17-2010   ORCHIECTOMY Right 02/21/2016   Procedure: SCROTAL ORCHIECTOMY with TESTICULAR PROSTHESIS IMPLANT;  Surgeon: Ihor Gully, MD;  Location: [facility];  Service: Urology;  Laterality: Right;   ORCHIECTOMY Left 09/02/2018   Procedure: ORCHIECTOMY;  Surgeon: Ihor Gully, MD;  Location: [facility];  Service: Urology;  Laterality: Left;   ROTATOR CUFF REPAIR Right 12/2004   TRANSURETHRAL RESECTION OF BLADDER TUMOR N/A 08/09/2012   Procedure: TRANSURETHRAL RESECTION OF BLADDER TUMOR (TURBT) WITH GYRUS WITH MITOMYCIN C;  Surgeon: Garnett Farm, MD;  Location: [facility];  Service: Urology;  Laterality: N/A;   TRANSURETHRAL RESECTION OF BLADDER TUMOR N/A 03/29/2020   Procedure: TRANSURETHRAL RESECTION OF BLADDER TUMOR (TURBT) and post-op instillation of gemcitabine;  Surgeon: [name], MD;  Location: [facility];  Service: Urology;  Laterality: N/A;   TRANSURETHRAL RESECTION OF BLADDER TUMOR WITH GYRUS (TURBT-GYRUS) N/A 02/27/2014   Procedure: TRANSURETHRAL RESECTION OF BLADDER TUMOR WITH GYRUS (TURBT-GYRUS);  Surgeon: Garnett Farm, MD;  Location: [facility];  Service: Urology;  Laterality: N/A;   Social History:  reports that he has been smoking cigarettes. He has a 76.00 pack-year smoking history. He has never used smokeless tobacco. He reports that he does not drink alcohol and does not use drugs.  Allergies  Allergen Reactions   Celecoxib Anaphylaxis, Swelling and Rash    Tongue swells   Hydrocodone Rash and Other (See Comments)    "blisters developed on arms"    Sulfa Antibiotics Rash   Sulfacetamide Sodium Rash    Family History  Problem Relation Age of Onset   Diabetes Mother    Diabetes Father    Hypertension Father    Heart attack Father 66       died age 68   Alcohol abuse Father    Colon cancer Neg Hx    Esophageal cancer Neg Hx    Stomach cancer Neg Hx    Rectal cancer Neg Hx     Prior to Admission medications   Medication Sig Start Date End Date Taking? Authorizing Provider  clopidogrel (PLAVIX) 75 MG tablet Take 1 tablet (75 mg total) by  mouth daily. 10/12/21  Yes Glean Salvo, NP  dicyclomine (BENTYL) 20 MG tablet Take 1 tablet (20 mg total) by mouth 2 (two) times daily as needed (abdominal cramping). 06/21/22  Yes Mardene Sayer, MD  DULoxetine (CYMBALTA) 60 MG capsule Take 1 capsule (60 mg total) by mouth daily. 10/12/21  Yes Glean Salvo, NP  famotidine (PEPCID) 20 MG tablet Take 1 tablet (20 mg total) by mouth 2 (two) times daily. 06/21/22  Yes Mardene Sayer, MD  fenofibrate 160 MG tablet Take 160 mg by mouth at bedtime. 12/31/19  Yes [provider]  HUMALOG KWIKPEN 200 UNIT/ML KwikPen Inject 25 Units into the skin in the morning, at noon, and at bedtime. 06/17/21  Yes [provider]  hydroxychloroquine (PLAQUENIL)  200 MG tablet Take 200 mg by mouth daily. 04/27/22  Yes [provider]  ibuprofen (ADVIL) 200 MG tablet Take 600 mg by mouth 2 (two) times daily. PRN   Yes [provider]  insulin glargine, 2 Unit Dial, (TOUJEO MAX SOLOSTAR) 300 UNIT/ML Solostar Pen Inject 100 Units into the skin daily. Patient taking differently: Inject 80 Units into the skin daily. 05/27/22  Yes Calvert Cantor, MD  lamoTRIgine (LAMICTAL) 150 MG tablet Take 1 tablet (150 mg total) by mouth 2 (two) times daily. 10/12/21  Yes Glean Salvo, NP  losartan (COZAAR) 25 MG tablet Take 25 mg by mouth daily. 03/14/22  Yes [provider]  lurasidone (LATUDA) 20 MG TABS tablet Take 40 mg by mouth at bedtime.   Yes [provider]  omeprazole (PRILOSEC) 40 MG capsule Take 40 mg by mouth daily. 08/23/21  Yes [provider]  rosuvastatin (CRESTOR) 10 MG tablet Take 1 tablet (10 mg total) by mouth daily. Patient taking differently: Take 10 mg by mouth at bedtime. 08/04/21 07/18/22 Yes Pashayan, Mardelle Matte, MD  tamsulosin (FLOMAX) 0.4 MG CAPS capsule Take 0.4 mg by mouth at bedtime.   Yes [provider]  traZODone (DESYREL) 150 MG tablet Take 1 tablet (150 mg total) by mouth at bedtime as  needed for sleep. 01/25/22  Yes Arfeen, Phillips Grout, MD  venlafaxine XR (EFFEXOR-XR) 150 MG 24 hr capsule Take 1 capsule (150 mg total) by mouth daily with breakfast. 01/25/22  Yes Arfeen, Phillips Grout, MD  Vitamin D, Ergocalciferol, (DRISDOL) 1.25 MG (50000 UNIT) CAPS capsule Take 50,000 Units by mouth every Wednesday. 12/26/19  Yes [provider]  acetaminophen (TYLENOL) 500 MG tablet Take 1,000 mg by mouth every 4 (four) hours as needed for moderate pain or headache.    [provider]  pregabalin (LYRICA) 200 MG capsule Take 200 mg by mouth 3 (three) times daily. 12/25/21   Dois Davenport, MD    Physical Exam: Vitals:   07/18/22 1730 07/18/22 1800 07/18/22 1815 07/18/22 1830  BP: (!) 153/86 123/75 133/77 (!) 157/89  Pulse: 85 84 81 82  Resp: (!) 23 19 14 19   Temp:    98.5 F (36.9 C)  TempSrc:      SpO2: 99% 100% 98% 99%   *** Data Reviewed: {Tip this will not be part of the note when signed- Document your independent interpretation of telemetry tracing, EKG, lab, Radiology test or any other diagnostic tests. Add any new diagnostic test ordered today. (Optional):26781} {Results:26384}  Assessment and Plan: No notes have been filed under this hospital service. Service: Hospitalist     Advance Care Planning:   Code Status: Prior ***  Consults: ***  Family Communication: ***  Severity of Illness: {Observation/Inpatient:21159}  AuthorLonia Blood, MD 07/18/2022 8:03 PM  For on call review [email or web address].

## 2022-07-18 NOTE — ED Triage Notes (Signed)
BIBA from PCP c/o dizziness and syncopal episode that was witnessed and assisted to floor x1 day.  PCP called due to hypotension.  Denies N/V/D NS and [id number] with EMS.  A&O x4 on arrival.

## 2022-07-18 NOTE — ED Notes (Signed)
Unable to perform standing orthostatic due when patient stood he went weak in legs and felt like someone was pushing on head.

## 2022-07-18 NOTE — ED Provider Notes (Addendum)
  Physical Exam  BP 116/72 (BP Location: Left Arm)   Pulse 84   Temp 97.9 F (36.6 C) (Oral)   Resp 18   SpO2 100%   Physical Exam  Procedures  Procedures  ED Course / MDM    Medical Decision Making Amount and/or Complexity of Data Reviewed Labs: ordered. Radiology: ordered. ECG/medicine tests: ordered.  Risk Prescription drug management. Decision regarding hospitalization.   Pt comes in with cc of dizziness, syncope. No vertigo. Symptoms are positional, but no n/v/d. AKI is noted. Hydration initiated. Neuro exam non focal, but when patient stands up, he is really symptomatic.  - may need Admit for optimization.    Derwood Kaplan, MD 07/18/22 1534  6:14 PM Patient unable to tolerate orthostatics.  Patient unable to ambulate. Creatinine has jumped from 1-1.85 over the last month. CK is normal. Sounds like POTS versus dysautonomia.  Neuroexam on my evaluation is also normal.  No nystagmus, no diplopia, no dysarthria or dysphagia.  No dizziness when he is resting.  Patient will need admission because of his AKI and what appears to be severe orthostasis.    Derwood Kaplan, MD 07/18/22 [phone]

## 2022-07-19 ENCOUNTER — Observation Stay (HOSPITAL_BASED_OUTPATIENT_CLINIC_OR_DEPARTMENT_OTHER): Payer: Medicare HMO

## 2022-07-19 DIAGNOSIS — G4700 Insomnia, unspecified: Secondary | ICD-10-CM | POA: Diagnosis present

## 2022-07-19 DIAGNOSIS — I2510 Atherosclerotic heart disease of native coronary artery without angina pectoris: Secondary | ICD-10-CM | POA: Diagnosis present

## 2022-07-19 DIAGNOSIS — Z794 Long term (current) use of insulin: Secondary | ICD-10-CM | POA: Diagnosis not present

## 2022-07-19 DIAGNOSIS — I959 Hypotension, unspecified: Secondary | ICD-10-CM | POA: Diagnosis present

## 2022-07-19 DIAGNOSIS — I428 Other cardiomyopathies: Secondary | ICD-10-CM | POA: Diagnosis not present

## 2022-07-19 DIAGNOSIS — G894 Chronic pain syndrome: Secondary | ICD-10-CM | POA: Diagnosis present

## 2022-07-19 DIAGNOSIS — M069 Rheumatoid arthritis, unspecified: Secondary | ICD-10-CM | POA: Diagnosis present

## 2022-07-19 DIAGNOSIS — K3184 Gastroparesis: Secondary | ICD-10-CM | POA: Diagnosis present

## 2022-07-19 DIAGNOSIS — E1143 Type 2 diabetes mellitus with diabetic autonomic (poly)neuropathy: Secondary | ICD-10-CM | POA: Diagnosis present

## 2022-07-19 DIAGNOSIS — G4733 Obstructive sleep apnea (adult) (pediatric): Secondary | ICD-10-CM | POA: Diagnosis present

## 2022-07-19 DIAGNOSIS — G40909 Epilepsy, unspecified, not intractable, without status epilepticus: Secondary | ICD-10-CM | POA: Diagnosis present

## 2022-07-19 DIAGNOSIS — R55 Syncope and collapse: Secondary | ICD-10-CM | POA: Diagnosis present

## 2022-07-19 DIAGNOSIS — E039 Hypothyroidism, unspecified: Secondary | ICD-10-CM | POA: Diagnosis present

## 2022-07-19 DIAGNOSIS — K219 Gastro-esophageal reflux disease without esophagitis: Secondary | ICD-10-CM | POA: Diagnosis present

## 2022-07-19 DIAGNOSIS — F4310 Post-traumatic stress disorder, unspecified: Secondary | ICD-10-CM | POA: Diagnosis present

## 2022-07-19 DIAGNOSIS — E1165 Type 2 diabetes mellitus with hyperglycemia: Secondary | ICD-10-CM | POA: Diagnosis present

## 2022-07-19 DIAGNOSIS — N179 Acute kidney failure, unspecified: Secondary | ICD-10-CM | POA: Diagnosis present

## 2022-07-19 DIAGNOSIS — E785 Hyperlipidemia, unspecified: Secondary | ICD-10-CM | POA: Diagnosis present

## 2022-07-19 DIAGNOSIS — I10 Essential (primary) hypertension: Secondary | ICD-10-CM | POA: Diagnosis present

## 2022-07-19 DIAGNOSIS — F17210 Nicotine dependence, cigarettes, uncomplicated: Secondary | ICD-10-CM | POA: Diagnosis present

## 2022-07-19 DIAGNOSIS — F3181 Bipolar II disorder: Secondary | ICD-10-CM | POA: Diagnosis present

## 2022-07-19 DIAGNOSIS — E860 Dehydration: Secondary | ICD-10-CM | POA: Diagnosis present

## 2022-07-19 DIAGNOSIS — F419 Anxiety disorder, unspecified: Secondary | ICD-10-CM | POA: Diagnosis present

## 2022-07-19 DIAGNOSIS — E1142 Type 2 diabetes mellitus with diabetic polyneuropathy: Secondary | ICD-10-CM | POA: Diagnosis present

## 2022-07-19 DIAGNOSIS — J449 Chronic obstructive pulmonary disease, unspecified: Secondary | ICD-10-CM | POA: Diagnosis present

## 2022-07-19 LAB — CBC
HCT: 32.4 % — ABNORMAL LOW (ref 39.0–52.0)
Hemoglobin: 11 g/dL — ABNORMAL LOW (ref 13.0–17.0)
MCH: 28.1 pg (ref 26.0–34.0)
MCHC: 34 g/dL (ref 30.0–36.0)
MCV: 82.9 fL (ref 80.0–100.0)
Platelets: 328 10*3/uL (ref 150–400)
RBC: 3.91 MIL/uL — ABNORMAL LOW (ref 4.22–5.81)
RDW: 12.2 % (ref 11.5–15.5)
WBC: 7.7 10*3/uL (ref 4.0–10.5)
nRBC: 0 % (ref 0.0–0.2)

## 2022-07-19 LAB — GLUCOSE, CAPILLARY
Glucose-Capillary: 112 mg/dL — ABNORMAL HIGH (ref 70–99)
Glucose-Capillary: 149 mg/dL — ABNORMAL HIGH (ref 70–99)
Glucose-Capillary: 206 mg/dL — ABNORMAL HIGH (ref 70–99)
Glucose-Capillary: 176 mg/dL — ABNORMAL HIGH (ref 70–99)
Glucose-Capillary: 222 mg/dL — ABNORMAL HIGH (ref 70–99)

## 2022-07-19 LAB — COMPREHENSIVE METABOLIC PANEL
ALT: 10 U/L (ref 0–44)
AST: 12 U/L — ABNORMAL LOW (ref 15–41)
Albumin: 2.8 g/dL — ABNORMAL LOW (ref 3.5–5.0)
Alkaline Phosphatase: 61 U/L (ref 38–126)
Anion gap: 9 (ref 5–15)
BUN: 24 mg/dL — ABNORMAL HIGH (ref 6–20)
CO2: 21 mmol/L — ABNORMAL LOW (ref 22–32)
Calcium: 8.1 mg/dL — ABNORMAL LOW (ref 8.9–10.3)
Chloride: 104 mmol/L (ref 98–111)
Creatinine, Ser: 1.43 mg/dL — ABNORMAL HIGH (ref 0.61–1.24)
GFR, Estimated: 58 mL/min — ABNORMAL LOW (ref >60)
Glucose, Bld: 94 mg/dL (ref 70–99)
Potassium: 3.6 mmol/L (ref 3.5–5.1)
Sodium: 134 mmol/L — ABNORMAL LOW (ref 135–145)
Total Bilirubin: 0.4 mg/dL (ref 0.3–1.2)
Total Protein: 5.6 g/dL — ABNORMAL LOW (ref 6.5–8.1)

## 2022-07-19 LAB — ECHOCARDIOGRAM COMPLETE
Area-P 1/2: 2.1 cm2
Calc EF: 59 %
Height: 72 in
MV VTI: 4.66 cm2
S' Lateral: 2.9 cm
Single Plane A2C EF: 60.9 %
Single Plane A4C EF: 57.2 %
Weight: 2941.82 oz

## 2022-07-19 LAB — MRSA NEXT GEN BY PCR, NASAL: MRSA by PCR Next Gen: DETECTED — AB

## 2022-07-19 LAB — CORTISOL-AM, BLOOD: Cortisol - AM: 9.7 ug/dL (ref 6.7–22.6)

## 2022-07-19 MED ORDER — INSULIN ASPART 100 UNIT/ML IJ SOLN
3.0000 [IU] | Freq: Three times a day (TID) | INTRAMUSCULAR | Status: DC
  Administered 2022-07-19 – 2022-07-20 (×2): 3 [IU] via SUBCUTANEOUS

## 2022-07-19 MED ORDER — CLOPIDOGREL BISULFATE 75 MG PO TABS
75.0000 mg | ORAL_TABLET | Freq: Every day | ORAL | Status: DC
  Administered 2022-07-19 – 2022-07-20 (×2): 75 mg via ORAL
  Filled 2022-07-19 (×2): qty 1

## 2022-07-19 MED ORDER — DICYCLOMINE HCL 20 MG PO TABS: 20.0000 mg | ORAL_TABLET | Freq: Two times a day (BID) | ORAL | Status: DC | PRN

## 2022-07-19 MED ORDER — FAMOTIDINE 20 MG PO TABS
20.0000 mg | ORAL_TABLET | Freq: Two times a day (BID) | ORAL | Status: DC
  Administered 2022-07-19 – 2022-07-20 (×2): 20 mg via ORAL
  Filled 2022-07-19 (×2): qty 1

## 2022-07-19 MED ORDER — LAMOTRIGINE 25 MG PO TABS
150.0000 mg | ORAL_TABLET | Freq: Two times a day (BID) | ORAL | Status: DC
  Administered 2022-07-19 – 2022-07-20 (×3): 150 mg via ORAL
  Filled 2022-07-19 (×3): qty 2

## 2022-07-19 MED ORDER — LOSARTAN POTASSIUM 25 MG PO TABS
25.0000 mg | ORAL_TABLET | Freq: Every day | ORAL | Status: DC
  Administered 2022-07-19: 25 mg via ORAL
  Filled 2022-07-19: qty 1

## 2022-07-19 MED ORDER — PREGABALIN 75 MG PO CAPS
200.0000 mg | ORAL_CAPSULE | Freq: Three times a day (TID) | ORAL | Status: DC
  Administered 2022-07-19 – 2022-07-20 (×3): 200 mg via ORAL
  Filled 2022-07-19 (×3): qty 1

## 2022-07-19 MED ORDER — TAMSULOSIN HCL 0.4 MG PO CAPS
0.4000 mg | ORAL_CAPSULE | Freq: Every day | ORAL | Status: DC
  Administered 2022-07-19: 0.4 mg via ORAL
  Filled 2022-07-19: qty 1

## 2022-07-19 MED ORDER — PANTOPRAZOLE SODIUM 40 MG PO TBEC
40.0000 mg | DELAYED_RELEASE_TABLET | Freq: Every day | ORAL | Status: DC
  Administered 2022-07-19 – 2022-07-20 (×2): 40 mg via ORAL
  Filled 2022-07-19 (×2): qty 1

## 2022-07-19 MED ORDER — TRAZODONE HCL 50 MG PO TABS: 150.0000 mg | ORAL_TABLET | Freq: Every evening | ORAL | Status: DC | PRN

## 2022-07-19 MED ORDER — HYDRALAZINE HCL 20 MG/ML IJ SOLN
10.0000 mg | INTRAMUSCULAR | Status: DC | PRN
  Administered 2022-07-19: 10 mg via INTRAVENOUS
  Filled 2022-07-19: qty 1

## 2022-07-19 MED ORDER — METOPROLOL TARTRATE 5 MG/5ML IV SOLN
5.0000 mg | INTRAVENOUS | Status: DC | PRN
  Filled 2022-07-19: qty 5

## 2022-07-19 MED ORDER — SENNOSIDES-DOCUSATE SODIUM 8.6-50 MG PO TABS: 1.0000 | ORAL_TABLET | Freq: Every evening | ORAL | Status: DC | PRN

## 2022-07-19 MED ORDER — LURASIDONE HCL 20 MG PO TABS
40.0000 mg | ORAL_TABLET | Freq: Every day | ORAL | Status: DC
  Administered 2022-07-19: 40 mg via ORAL
  Filled 2022-07-19: qty 2

## 2022-07-19 MED ORDER — GUAIFENESIN 100 MG/5ML PO LIQD: 5.0000 mL | ORAL | Status: DC | PRN

## 2022-07-19 MED ORDER — IPRATROPIUM-ALBUTEROL 0.5-2.5 (3) MG/3ML IN SOLN: 3.0000 mL | RESPIRATORY_TRACT | Status: DC | PRN

## 2022-07-19 MED ORDER — VENLAFAXINE HCL ER 150 MG PO CP24
150.0000 mg | ORAL_CAPSULE | Freq: Every day | ORAL | Status: DC
  Administered 2022-07-20: 150 mg via ORAL
  Filled 2022-07-19: qty 1

## 2022-07-19 MED ORDER — POTASSIUM CHLORIDE CRYS ER 20 MEQ PO TBCR
40.0000 meq | EXTENDED_RELEASE_TABLET | Freq: Once | ORAL | Status: AC
  Administered 2022-07-19: 40 meq via ORAL
  Filled 2022-07-19: qty 2

## 2022-07-19 MED ORDER — INSULIN GLARGINE-YFGN 100 UNIT/ML ~~LOC~~ SOLN
30.0000 [IU] | Freq: Every day | SUBCUTANEOUS | Status: DC
  Administered 2022-07-19: 30 [IU] via SUBCUTANEOUS
  Filled 2022-07-19 (×2): qty 0.3

## 2022-07-19 MED ORDER — TRAZODONE HCL 50 MG PO TABS: 50.0000 mg | ORAL_TABLET | Freq: Every evening | ORAL | Status: DC | PRN

## 2022-07-19 MED ORDER — FENOFIBRATE 160 MG PO TABS
160.0000 mg | ORAL_TABLET | Freq: Every day | ORAL | Status: DC
  Administered 2022-07-19: 160 mg via ORAL
  Filled 2022-07-19: qty 1

## 2022-07-19 NOTE — Plan of Care (Signed)
  Problem: Education: Goal: Knowledge of General Education information will improve Description: Including pain rating scale, medication(s)/side effects and non-pharmacologic comfort measures Outcome: Not Progressing   Problem: Health Behavior/Discharge Planning: Goal: Ability to manage health-related needs will improve Outcome: Not Progressing   

## 2022-07-19 NOTE — Progress Notes (Signed)
  Echocardiogram 2D Echocardiogram has been performed.  Vincent [name] 07/19/2022, 12:32 PM

## 2022-07-19 NOTE — Evaluation (Addendum)
Physical Therapy Evaluation Patient Details Name: Vincent [name] MRN: [id number] DOB: 1966/01/07 Today's Date: 07/19/2022  History of Present Illness  Pt is 57 yo male admitted on 07/18/22 with lightheadedness and hypotension. Noted suspicion for autonomic dysfunction from diabetic neuropathy.  Pt has been give boluses of fluid. Pt with hx of anxiety, bipolar, CAD, chronic pain, COPD, DDD, gastroparesis, DM with neuropathy, seizures, HTN (reports "baby dose" med)  Clinical Impression  Pt admitted with above diagnosis. At baseline, pt reports independent; however, he reports several falls and requiring AD over the past 6 months due to the dizzy spells that come and go. He typically lives alone but sister staying recently to assist.  He describes the symptoms as "head heavy" and needing to sit that occurs when he stands.  Denied any spinning sensations. Today, pt was supervision for bed mobility but then min-mod A for standing and to take a few steps.  He did have orthostatic BP with significant drop in blood pressure with standing that worsened after 3 mins and was not able to take more than a few steps.  Did screen for BPPV as pt reports hit his head with a fall and was positive (mild symptoms) for L posterior canal BPPV. With BPPV testing did have spinning sensation but reports very different from his normal dizziness/weakness. Suspect falls and decreased mobility more related to orthostatic hypotension but will also follow for BPPV as needed. Pt currently with functional limitations due to the deficits listed below (see PT Problem List). Pt will benefit from acute skilled PT to increase their independence and safety with mobility to allow discharge.  At this time pt not safe to mobilize at home and would need further therapy prior to return home; however, if able to manage orthostatic BP could improve to lower level of care.  MD in during evaluation and plans to add TED hose.      BP and HR as follows:   Supine: 173/88  and HR 93 Sit: 169/81 and HR 92 Sit 3 mins : 168/84 and HR 94 Standing: 125/76 and HR 104 (mild symptoms) Standing 3 mins: 115/59  and HR 106 (severe symptoms, mod A, returned to sitting) Sitting : 168/75 and HR 97   Recommendations for follow up therapy are one component of a multi-disciplinary discharge planning process, led by the attending physician.  Recommendations may be updated based on patient status, additional functional criteria and insurance authorization.  Follow Up Recommendations Can patient physically be transported by private vehicle: Yes     Assistance Recommended at Discharge Frequent or constant Supervision/Assistance  Patient can return home with the following  A lot of help with walking and/or transfers;A lot of help with bathing/dressing/bathroom;Assistance with cooking/housework;Help with stairs or ramp for entrance    Equipment Recommendations None recommended by PT  Recommendations for Other Services       Functional Status Assessment Patient has had a recent decline in their functional status and demonstrates the ability to make significant improvements in function in a reasonable and predictable amount of time.     Precautions / Restrictions Precautions Precautions: Fall Precaution Comments: watch BP Restrictions Weight Bearing Restrictions: No      Mobility  Bed Mobility Overal bed mobility: Needs Assistance Bed Mobility: Supine to Sit, Sit to Supine     Supine to sit: Supervision Sit to supine: Supervision        Transfers Overall transfer level: Needs assistance Equipment used: Rolling walker (2 wheels) Transfers: Sit to/from Stand  Sit to Stand: Min assist           General transfer comment: light min A to steady    Ambulation/Gait Ambulation/Gait assistance: Min assist, Mod assist Gait Distance (Feet): 6 Feet Assistive device: Rolling walker (2 wheels) Gait Pattern/deviations: Step-to pattern, Decreased  stride length Gait velocity: decreased     General Gait Details: Pt initially min A but with increased time and symptoms worsening needed mod A to stabilize.  Describes symptoms as head heavy and legs giving out  Stairs            Wheelchair Mobility    Modified Rankin (Stroke Patients Only)       Balance Overall balance assessment: Needs assistance Sitting-balance support: No upper extremity supported Sitting balance-Leahy Scale: Good     Standing balance support: Bilateral upper extremity supported Standing balance-Leahy Scale: Poor                               Pertinent Vitals/Pain Pain Assessment Pain Assessment: 0-10 Pain Score: 6  Pain Location: ribs and back Pain Descriptors / Indicators: Discomfort Pain Intervention(s): Limited activity within patient's tolerance, Monitored during session    Home Living Family/patient expects to be discharged to:: Private residence Living Arrangements: Other relatives (sister) Available Help at Discharge: Family;Available 24 hours/day Type of Home: Mobile home Home Access: Stairs to enter Entrance Stairs-Rails: Right;Left Entrance Stairs-Number of Steps: 5   Home Layout: One level Home Equipment: Rollator (4 wheels);Cane - single point      Prior Function Prior Level of Function : Independent/Modified Independent             Mobility Comments: Uses rollator in community; no AD in homes; 5 falls just prior to admission due to dizziness and multiple throughout this year all due to dizziness ADLs Comments: Normally independent adls and iadls     Hand Dominance   Dominant Hand: Left    Extremity/Trunk Assessment   Upper Extremity Assessment Upper Extremity Assessment: Overall WFL for tasks assessed    Lower Extremity Assessment Lower Extremity Assessment: Overall WFL for tasks assessed    Cervical / Trunk Assessment Cervical / Trunk Assessment: Normal  Communication   Communication: No  difficulties  Cognition Arousal/Alertness: Awake/alert Behavior During Therapy: WFL for tasks assessed/performed Overall Cognitive Status: Within Functional Limits for tasks assessed                                          General Comments  ORTHOSTATIC and VESTIBULAR Assessment  History: Feels like head gets heavy and throws off balance.  Denies syncope until this last time when he fell hit his head and then passed out.  Feels like legs won't hold him during these episode.  Denies spinning sensation. Has been going on for 6 months on and off.  Only happens when he stands typically within 1-2 mins.  Does not happen when rolling in bed or turning head. Does happen severely if bends to pick something from floor.   BP and HR as follows:  Supine: 173/88  and HR 93 Sit: 169/81 and HR 92 Sit 3 mins : 168/84 and HR 94 Standing: 125/76 and HR 104 (mild symptoms) Standing 3 mins: 115/59  and HR 106 (severe symptoms, mod A, returned to sitting) Sitting : 168/75 and HR 97   Smooth Pursuit:  intact, negative dizzy/nystagmus Gaze Stabilization: intact, negative dizzy/nystagmus Head Thrust: normal [address] R: Negative [place] L: spinning sensation lasting ~30 seconds, only 1-2 beats nystagmus  Performed Epley for L posterior [employer] pt on orthostatic hypotension safety including slow transitions with AROM exercises or weight shifting/marching in place prior to progressing to next step.  Also, discussed staying hydrated and use of TED hose to manage.   Did educate pt on BPPV and + test that will manage with PT but that orthostatic hypotension seems more related/consistent  to his falls and weakness.    Exercises     Assessment/Plan    PT Assessment Patient needs continued PT services  PT Problem List Decreased strength;Pain;Cardiopulmonary status limiting activity;Other (comment);Decreased activity tolerance;Decreased balance;Decreased mobility;Decreased  safety awareness (dizziness)       PT Treatment Interventions DME instruction;Gait training;Balance training;Stair training;Modalities;Neuromuscular re-education;Functional mobility training;Therapeutic activities;Patient/family education;Therapeutic exercise;Other (comment) (education orthostatic hypotension; vestibular/canalith repositioning/habituation)    PT Goals (Current goals can be found in the Care Plan section)  Acute Rehab PT Goals Patient Stated Goal: figure out what is causing dizziness PT Goal Formulation: With patient Time For Goal Achievement: 08/02/22 Potential to Achieve Goals: Good    Frequency Min 1X/week     Co-evaluation               AM-PAC PT "6 Clicks" Mobility  Outcome Measure Help needed turning from your back to your side while in a flat bed without using bedrails?: A Little Help needed moving from lying on your back to sitting on the side of a flat bed without using bedrails?: A Little Help needed moving to and from a bed to a chair (including a wheelchair)?: A Lot Help needed standing up from a chair using your arms (e.g., wheelchair or bedside chair)?: A Lot Help needed to walk in hospital room?: Total Help needed climbing 3-5 steps with a railing? : Total 6 Click Score: 12    End of Session Equipment Utilized During Treatment: Gait belt Activity Tolerance: Treatment limited secondary to medical complications (Comment) Patient left: in bed;with call bell/phone within reach;with bed alarm set Nurse Communication: Mobility status PT Visit Diagnosis: Other abnormalities of gait and mobility (R26.89);Muscle weakness (generalized) (M62.81);Dizziness and giddiness (R42)    Time: 1610-9604 PT Time Calculation (min) (ACUTE ONLY): 45 min   Charges:   PT Evaluation $PT Eval Moderate Complexity: 1 Mod PT Treatments $Therapeutic Activity: 8-22 mins $Canalith Rep Proc: 8-22 mins        Anise Salvo, PT Acute Rehab [facility]  [phone]   Rayetta Humphrey 07/19/2022, 11:47 AM

## 2022-07-19 NOTE — Evaluation (Signed)
Occupational Therapy Evaluation Patient Details Name: Vincent [name] MRN: [id number] DOB: 11-07-1965 Today's Date: 07/19/2022   History of Present Illness Pt is 57 yo male admitted on 07/18/22 with lightheadedness and hypotension. Noted suspicion for autonomic dysfunction from diabetic neuropathy.  Pt has been give boluses of fluid. Pt with hx of anxiety, CAD, chronic pain, COPD, DDD, gastroparesis, DM with neuropathy, seizures, HTN   Clinical Impression   The pt is typically modified independent to independent with ADLs, however he is currently presenting below his baseline level of functioning. He reported a sudden onset of dizziness and lightheadedness, which resulting falls which started a couple days ago. He also reported having chronic neuropathy with associated pain, numbness, and tingling of his hands and feet. During the session today his blood pressure was taken and noted to be: 169/79 (supine), 132/84 (seated EOB), and 111/74. He presented with dizziness and lightheadedness upon standing, therefore attempts were deferred at progressive out of bed ADLs/activity. Given the aforementioned symptoms, he is currently at high risk for falls and restricted participation in meaningful activities. As such, he will continue to benefit from OT services to maximize his safety and independence with ADLs. OT anticipates his overall ADL performance and functional independence would be greatly improved, pending improvement in his acute symptoms. Pt indicates a desire to resume outpatient therapy once discharged from the hospital, should his symptoms improve or resolve, however he stated he would consider home health therapy if recommended.      Recommendations for follow up therapy are one component of a multi-disciplinary discharge planning process, led by the attending physician.  Recommendations may be updated based on patient status, additional functional criteria and insurance authorization.   Assistance  Recommended at Discharge Frequent or constant Supervision/Assistance  Patient can return home with the following Help with stairs or ramp for entrance;Assistance with cooking/housework;A little help with walking and/or transfers;A little help with bathing/dressing/bathroom    Functional Status Assessment  Patient has had a recent decline in their functional status and demonstrates the ability to make significant improvements in function in a reasonable and predictable amount of time.  Equipment Recommendations  Tub/shower seat    Recommendations for Other Services       Precautions / Restrictions Precautions Precautions: Fall Precaution Comments:  (monitor blood pressure) Restrictions Weight Bearing Restrictions: No      Mobility Bed Mobility Overal bed mobility: Needs Assistance Bed Mobility: Supine to Sit, Sit to Supine     Supine to sit: Supervision Sit to supine: Supervision        Transfers Overall transfer level: Needs assistance Equipment used: Rolling walker (2 wheels) Transfers: Sit to/from Stand Sit to Stand: Min guard                  Balance     Sitting balance-Leahy Scale: Good         Standing balance comment: Min guard for static standing using a RW                           ADL either performed or assessed with clinical judgement   ADL Overall ADL's : Needs assistance/impaired Eating/Feeding: Independent;Sitting   Grooming: Set up;Sitting           Upper Body Dressing : Set up;Sitting   Lower Body Dressing: Minimal assistance;Sit to/from stand   Toilet Transfer: Minimal assistance;BSC/3in1 Statistician Details (indicate cue type and reason): based on clinical judgement  Vision Patient Visual Report: No change from baseline Additional Comments: He correctly read the time depicted on the wall clock.     Perception     Praxis      Pertinent Vitals/Pain Pain Assessment Pain Assessment:  0-10 Pain Score: 7  Pain Location: chronic pain in his feet and hands, which he attributes to neuropathy Pain Intervention(s): Patient requesting pain meds-RN notified, Limited activity within patient's tolerance     Hand Dominance Left   Extremity/Trunk Assessment Upper Extremity Assessment Upper Extremity Assessment: Overall WFL for tasks assessed;RUE deficits/detail;LUE deficits/detail RUE Deficits / Details: chronic numbness, tingling and burning of his hands due to reported neuropathy LUE Deficits / Details: chronic numbness, tingling and burning of his hands due to reported neuropathy   Lower Extremity Assessment Lower Extremity Assessment: LLE deficits/detail;RLE deficits/detail RLE Deficits / Details: AROM and strength WFL RLE Sensation: history of peripheral neuropathy (chronic numbness, tingling and burning of his feet) LLE Deficits / Details: AROM and strength WFL LLE Sensation: history of peripheral neuropathy (chronic numbness, tingling and burning of his feet)       Communication Communication Communication: No difficulties   Cognition   Behavior During Therapy: WFL for tasks assessed/performed Overall Cognitive Status: Within Functional Limits for tasks assessed                                 General Comments: Oriented x4, able to follow commands without difficulty     General Comments               Home Living Family/patient expects to be discharged to:: Private residence Living Arrangements: Other (Comment) (His sister recently moved in with him.) Available Help at Discharge: Family Type of Home: Mobile home Home Access: Stairs to enter Entrance Stairs-Number of Steps: 5 Entrance Stairs-Rails: Right;Left Home Layout: One level     Bathroom Shower/Tub: Tub/shower unit;Walk-in shower         Home Equipment: Rollator (4 wheels);Cane - single point          Prior Functioning/Environment Prior Level of Function :  Independent/Modified Independent             Mobility Comments:  (He used a cane inside the home and rollator outside the home. He has been receiving outpatient therapy.) ADLs Comments:  (He was independent with ADLs, driving, and household chores, prior to a couple days ago when his symptoms started.)        OT Problem List: Decreased activity tolerance;Impaired balance (sitting and/or standing);Impaired sensation;Pain      OT Treatment/Interventions: Self-care/ADL training;Therapeutic exercise;Therapeutic activities;Energy conservation;Visual/perceptual remediation/compensation;Patient/family education;DME and/or AE instruction;Balance training    OT Goals(Current goals can be found in the care plan section) Acute Rehab OT Goals Patient Stated Goal: improvement in this symptoms OT Goal Formulation: With patient/family Time For Goal Achievement: 08/02/22 Potential to Achieve Goals: Good ADL Goals Pt Will Perform Grooming: standing;with supervision Pt Will Perform Lower Body Dressing: with supervision;sit to/from stand Pt Will Transfer to Toilet: with supervision;ambulating Pt Will Perform Toileting - Clothing Manipulation and hygiene: with supervision;sit to/from stand  OT Frequency: Min 1X/week       AM-PAC OT "6 Clicks" Daily Activity     Outcome Measure Help from another person eating meals?: None Help from another person taking care of personal grooming?: A Little Help from another person toileting, which includes using toliet, bedpan, or urinal?: A Lot Help from another person bathing (including  washing, rinsing, drying)?: A Lot Help from another person to put on and taking off regular upper body clothing?: None Help from another person to put on and taking off regular lower body clothing?: A Little 6 Click Score: 18   End of Session Equipment Utilized During Treatment: Gait belt;Rolling walker (2 wheels) Nurse Communication: Other (comment) (Pt's request for [place]  and his blood pressure readings taken during the session)  Activity Tolerance: Other (comment) (Limited by lightheadedness and dizziness upon standing) Patient left: in bed;with call bell/phone within reach;with bed alarm set;with nursing/sitter in room;with family/visitor present  OT Visit Diagnosis: Unsteadiness on feet (R26.81);Pain                Time: 1610-9604 OT Time Calculation (min): 30 min Charges:  OT General Charges $OT Visit: 1 Visit OT Evaluation $OT Eval Moderate Complexity: 1 Mod    Maxwell Martorano L Johari Pinney, OTR/L 07/19/2022, 4:15 PM

## 2022-07-19 NOTE — Progress Notes (Signed)
PROGRESS NOTE    Vincent [name]  ZOX:[id number] DOB: 04/30/1965 DOA: 07/18/2022 PCP: Dois Davenport, MD   Brief Narrative:  57 y.o. male with medical history significant of uncontrolled diabetes with A1c of more than 15, anxiety disorder, chronic pain syndrome, history of bladder cancer, bipolar 2 disorder, coronary artery disease, chronic pain syndrome, COPD, GERD, history of gastroparesis was brought in with lightheadedness and hypotension.  In PCPs office patient's systolic blood pressure was noted to be in 60s.   Assessment & Plan:  Principal Problem:   Syncope and collapse Active Problems:   Hypotension   AKI (acute kidney injury) (HCC)   Bipolar II disorder, most recent episode major depressive (HCC)   Chronic pain   DM type 2 with diabetic peripheral neuropathy (HCC)   History of seizure   Protein-calorie malnutrition, severe (HCC)   Hyperlipidemia    Hypotension with syncope History of hypertension -Patient reported of syncopal episode at home.  I suspect from dehydration along with component of positional vertigo/autonomic dysfunction due to uncontrolled diabetes - IV fluids.  Will check echocardiogram - TSH, random cortisol.  Knee-high compression stockings -Home antihypertensives on hold  Acute kidney injury Baseline Cr 1.0. Admission Cr 1.83. improving.   Uncontrolled diabetes mellitus type 2, insulin-dependent -Hyperglycemia.  A1c more than 15.  Sliding scale and Accu-Chek.  On outpatient Toujeo 100 units twice daily.  Will consult diabetic coordinator.  Bipolar disorder -Continue home medications  Seizure disorder? -Not on home medications. Unclear hx.   Rheumatoid arthritis - Plaquenil  Chronic pain syndrome  Hyperlipidemia Fenofibrate.   Previous of Stroke Like symptoms On Plavix placed be neuro dating back Sept 2022.       DVT prophylaxis: Lovenox Code Status: Full code Family Communication:   Continue spittle stay, still feels quite  dizzy       Diet Orders (From admission, onward)     Start     Ordered   07/18/22 2014  Diet Carb Modified Fluid consistency: Thin; Room service appropriate? Yes  Diet effective now       Question Answer Comment  Diet-HS Snack? Nothing   Calorie Level Medium 1600-2000   Fluid consistency: Thin   Room service appropriate? Yes      07/18/22 2014            Subjective:  Seen at bedside, still feels quite dizzy with change in position  Examination:  General exam: Appears calm and comfortable  Respiratory system: Clear to auscultation. Respiratory effort normal. Cardiovascular system: S1 & S2 heard, RRR. No JVD, murmurs, rubs, gallops or clicks. No pedal edema. Gastrointestinal system: Abdomen is nondistended, soft and nontender. No organomegaly or masses felt. Normal bowel sounds heard. Central nervous system: Alert and oriented. No focal neurological deficits. Extremities: Symmetric 5 x 5 power. Skin: No rashes, lesions or ulcers Psychiatry: Judgement and insight appear normal. Mood & affect appropriate.  Objective: Vitals:   07/18/22 2018 07/19/22 0001 07/19/22 0401 07/19/22 0806  BP: 102/63 112/65 (!) 154/74 (!) 155/68  Pulse: 84 87 90 96  Resp: (!) 21 18 (!) 23 18  Temp: 97.9 F (36.6 C) 97.8 F (36.6 C) 98.1 F (36.7 C) 98 F (36.7 C)  TempSrc: Oral Oral Oral Oral  SpO2: 100% 99% 99% 100%  Weight: 83.4 kg     Height: 6' (1.829 m)       Intake/Output Summary (Last 24 hours) at 07/19/2022 0840 Last data filed at 07/19/2022 0245 Gross per 24 hour  Intake 1240  ml  Output 900 ml  Net 340 ml   Filed Weights   07/18/22 2018  Weight: 83.4 kg    Scheduled Meds:  DULoxetine  60 mg Oral Daily   enoxaparin (LOVENOX) injection  40 mg Subcutaneous Q24H   hydroxychloroquine  200 mg Oral Daily   insulin aspart  0-15 Units Subcutaneous TID WC   insulin aspart  0-5 Units Subcutaneous QHS   rosuvastatin  10 mg Oral QHS   Continuous Infusions:  lactated ringers  125 mL/hr at 07/19/22 0427    Nutritional status     Body mass index is 24.94 kg/m.  Data Reviewed:   CBC: Recent Labs  Lab 07/18/22 1343 07/19/22 0413  WBC 11.1* 7.7  NEUTROABS 6.8  --   HGB 12.7* 11.0*  HCT 36.8* 32.4*  MCV 82.1 82.9  PLT 347 328   Basic Metabolic Panel: Recent Labs  Lab 07/18/22 1343 07/19/22 0413  NA 133* 134*  K 4.2 3.6  CL 101 104  CO2 23 21*  GLUCOSE 195* 94  BUN 28* 24*  CREATININE 1.83* 1.43*  CALCIUM 8.8* 8.1*   GFR: Estimated Creatinine Clearance: 64.1 mL/min (A) (by C-G formula based on SCr of 1.43 mg/dL (H)). Liver Function Tests: Recent Labs  Lab 07/18/22 1343 07/19/22 0413  AST 13* 12*  ALT 13 10  ALKPHOS 83 61  BILITOT 0.4 0.4  PROT 7.0 5.6*  ALBUMIN 3.3* 2.8*   No results for input(s): "LIPASE", "AMYLASE" in the last 168 hours. No results for input(s): "AMMONIA" in the last 168 hours. Coagulation Profile: No results for input(s): "INR", "PROTIME" in the last 168 hours. Cardiac Enzymes: Recent Labs  Lab 07/18/22 1755  CKTOTAL 64   BNP (last 3 results) No results for input(s): "PROBNP" in the last 8760 hours. HbA1C: No results for input(s): "HGBA1C" in the last 72 hours. CBG: Recent Labs  Lab 07/19/22 0744  GLUCAP 149*   Lipid Profile: No results for input(s): "CHOL", "HDL", "LDLCALC", "TRIG", "CHOLHDL", "LDLDIRECT" in the last 72 hours. Thyroid Function Tests: Recent Labs    07/18/22 1804  TSH 1.479   Anemia Panel: No results for input(s): "VITAMINB12", "FOLATE", "FERRITIN", "TIBC", "IRON", "RETICCTPCT" in the last 72 hours. Sepsis Labs: No results for input(s): "PROCALCITON", "LATICACIDVEN" in the last 168 hours.  Recent Results (from the past 240 hour(s))  MRSA Next Gen by PCR, Nasal     Status: Abnormal   Collection Time: 07/18/22  8:44 PM   Specimen: Nasal Mucosa; Nasal Swab  Result Value Ref Range Status   MRSA by PCR Next Gen DETECTED (A) NOT DETECTED Final    Comment: (NOTE) The  GeneXpert MRSA Assay (FDA approved for NASAL specimens only), is one component of a comprehensive MRSA colonization surveillance program. It is not intended to diagnose MRSA infection nor to guide or monitor treatment for MRSA infections. Test performance is not FDA approved in patients less than 14 years old. Performed at [facility], [address], [place], [place] [address]          Radiology Studies: CT Head Wo Contrast  Result Date: 07/18/2022 CLINICAL DATA:  Head trauma, moderate-severe. Dizziness and syncope. Fell to the floor. EXAM: CT HEAD WITHOUT CONTRAST TECHNIQUE: Contiguous axial images were obtained from the base of the skull through the vertex without intravenous contrast. RADIATION DOSE REDUCTION: This exam was performed according to the departmental dose-optimization program which includes automated exposure control, adjustment of the mA and/or kV according to patient size and/or  use of iterative reconstruction technique. COMPARISON:  05/23/2022 FINDINGS: Brain: The brain shows a normal appearance without evidence of malformation, atrophy, old or acute small or large vessel infarction, mass lesion, hemorrhage, hydrocephalus or extra-axial collection. Vascular: No hyperdense vessel. No evidence of atherosclerotic calcification. Skull: Normal.  No traumatic finding.  No focal bone lesion. Sinuses/Orbits: Sinuses are clear. Orbits appear normal. Mastoids are clear. Other: None significant IMPRESSION: Normal head CT. Electronically Signed   By: Paulina Fusi M.D.   On: 07/18/2022 14:47           LOS: 0 days   Time spent= 35 mins    Vincent Metzger Joline Maxcy, MD [facility]  If 7PM-7AM, please contact night-coverage  07/19/2022, 8:40 AM

## 2022-07-19 NOTE — Inpatient Diabetes Management (Signed)
Inpatient Diabetes Program Recommendations  AACE/ADA: New Consensus Statement on Inpatient Glycemic Control (2015)  Target Ranges:  Prepandial:   less than 140 mg/dL      Peak postprandial:   less than 180 mg/dL (1-2 hours)      Critically ill patients:  140 - 180 mg/dL   Lab Results  Component Value Date   GLUCAP 206 (H) 07/19/2022   HGBA1C >15.5 (H) 05/23/2022    Review of Glycemic Control  Latest Reference Range & Units 07/19/22 07:44 07/19/22 11:35  Glucose-Capillary 70 - 99 mg/dL 161 (H) 096 (H)  (H): Data is abnormally high Diabetes history: Type 2 DM Outpatient Diabetes medications: Humalog 25 units TID, Toujeo 80 units QD Current orders for Inpatient glycemic control: Novolog 0-15 units TID & HS  Inpatient Diabetes Program Recommendations:    Consider adding Semglee 30 units QHS and Novolog 4 units TID (assuming patient consuming >50% of meals).   Spoke with patient regarding previous A1C of >15.5% Patient reports things with his blood sugars have much improved since previous DM coordinator spoke with him last admission. Patient wearing Freestyle Libre and reports he is never above 200's mg/dL now and denies issues with hypoglycemia that may have contributed to syncope. Feels doses are appropraite. Denies missing doses. Has follow up scheduled with PCP. No questions at this time.   Thanks, Lujean Rave, MSN, RNC-OB Diabetes Coordinator [phone] (8a-5p)

## 2022-07-20 ENCOUNTER — Ambulatory Visit: Payer: Medicare HMO | Admitting: Physical Therapy

## 2022-07-20 DIAGNOSIS — R55 Syncope and collapse: Secondary | ICD-10-CM | POA: Diagnosis not present

## 2022-07-20 LAB — MAGNESIUM: Magnesium: 1.7 mg/dL (ref 1.7–2.4)

## 2022-07-20 LAB — BASIC METABOLIC PANEL
Anion gap: 7 (ref 5–15)
BUN: 15 mg/dL (ref 6–20)
CO2: 23 mmol/L (ref 22–32)
Calcium: 8.5 mg/dL — ABNORMAL LOW (ref 8.9–10.3)
Chloride: 105 mmol/L (ref 98–111)
Creatinine, Ser: 0.96 mg/dL (ref 0.61–1.24)
GFR, Estimated: >60 mL/min (ref >60)
Glucose, Bld: 177 mg/dL — ABNORMAL HIGH (ref 70–99)
Potassium: 4.1 mmol/L (ref 3.5–5.1)
Sodium: 135 mmol/L (ref 135–145)

## 2022-07-20 LAB — CBC
HCT: 35.4 % — ABNORMAL LOW (ref 39.0–52.0)
Hemoglobin: 12 g/dL — ABNORMAL LOW (ref 13.0–17.0)
MCH: 28.4 pg (ref 26.0–34.0)
MCHC: 33.9 g/dL (ref 30.0–36.0)
MCV: 83.7 fL (ref 80.0–100.0)
Platelets: 295 10*3/uL (ref 150–400)
RBC: 4.23 MIL/uL (ref 4.22–5.81)
RDW: 12.1 % (ref 11.5–15.5)
WBC: 7.9 10*3/uL (ref 4.0–10.5)
nRBC: 0 % (ref 0.0–0.2)

## 2022-07-20 LAB — GLUCOSE, CAPILLARY: Glucose-Capillary: 189 mg/dL — ABNORMAL HIGH (ref 70–99)

## 2022-07-20 MED ORDER — LACTATED RINGERS IV SOLN: INTRAVENOUS | Status: DC

## 2022-07-20 MED ORDER — LOSARTAN POTASSIUM 50 MG PO TABS: 50.0000 mg | ORAL_TABLET | Freq: Every day | ORAL | Status: DC

## 2022-07-20 NOTE — Progress Notes (Signed)
Physical Therapy Treatment Patient Details Name: Vincent [name] MRN: [id number] DOB: Apr 07, 1965 Today's Date: 07/20/2022   History of Present Illness Pt is 57 yo male admitted on 07/18/22 with lightheadedness and hypotension. Noted suspicion for autonomic dysfunction from diabetic neuropathy.  Pt has been give boluses of fluid. Pt with hx of anxiety, bipolar, CAD, chronic pain, COPD, DDD, gastroparesis, DM with neuropathy, seizures, HTN (reports "baby dose" med)    PT Comments    Pt supine in bed without dizziness, only reporting chronic back pain upon arrival. Pt with increasing dizziness/lightheadedness complaints when coming to standing, BP recorded below. Pt able to take 1 sidestep up to [facility] with min A to steady using RW. Pt able to demo good activation with ankle pumps, heel slides and SLR, reports familiar with these exercises from previous PT and agreeable to perform while in bed as able. RN and MD notified.  BP taken in L arm. Supine: 152/72 HR 96 Sitting: 125/98 HR 94 Sitting 3 min: 129/83 HR 91 Standing: 76/55 HR 102 (increased dizziness, increased unsteadiness) Standing 3 min: unable Sitting 90/60 HR 97 Return to supine: 150/76 HR 93   Recommendations for follow up therapy are one component of a multi-disciplinary discharge planning process, led by the attending physician.  Recommendations may be updated based on patient status, additional functional criteria and insurance authorization.  Follow Up Recommendations  Can patient physically be transported by private vehicle: Yes    Assistance Recommended at Discharge Frequent or constant Supervision/Assistance  Patient can return home with the following A lot of help with walking and/or transfers;A lot of help with bathing/dressing/bathroom;Assistance with cooking/housework;Help with stairs or ramp for entrance   Equipment Recommendations  None recommended by PT    Recommendations for Other Services       Precautions /  Restrictions Precautions Precautions: Fall Precaution Comments: monitor BP Restrictions Weight Bearing Restrictions: No     Mobility  Bed Mobility Overal bed mobility: Modified Independent  General bed mobility comments: supine<>sit mod ind    Transfers Overall transfer level: Needs assistance Equipment used: Rolling walker (2 wheels) Transfers: Sit to/from Stand Sit to Stand: Min guard   General transfer comment: min G to power to stand and for controlled return to sitting, cues for hand placement    Ambulation/Gait  General Gait Details: pt takes 1 sidestep up to [facility], unsteady needing min A   Stairs             Wheelchair Mobility    Modified Rankin (Stroke Patients Only)       Balance Overall balance assessment: Needs assistance Sitting-balance support: No upper extremity supported Sitting balance-Leahy Scale: Good  Standing balance support: Bilateral upper extremity supported Standing balance-Leahy Scale: Poor     Cognition Arousal/Alertness: Awake/alert Behavior During Therapy: WFL for tasks assessed/performed Overall Cognitive Status: Within Functional Limits for tasks assessed     Exercises General Exercises - Lower Extremity Ankle Circles/Pumps: Supine, AROM, Both, 10 reps Heel Slides: Supine, AROM, Both, 5 reps Straight Leg Raises: Supine, AROM, Strengthening, Both, 5 reps    General Comments        Pertinent Vitals/Pain Pain Assessment Pain Assessment: Faces Faces Pain Scale: Hurts even more Pain Location: chronic back pain Pain Descriptors / Indicators: Discomfort Pain Intervention(s): Limited activity within patient's tolerance, Monitored during session, Repositioned    Home Living  Prior Function            PT Goals (current goals can now be found in the care plan section) Acute Rehab PT Goals Patient Stated Goal: figure out what is causing dizziness PT Goal Formulation: With patient Time  For Goal Achievement: 08/02/22 Potential to Achieve Goals: Good Progress towards PT goals: Progressing toward goals    Frequency    Min 1X/week      PT Plan Current plan remains appropriate    Co-evaluation              AM-PAC PT "6 Clicks" Mobility   Outcome Measure  Help needed turning from your back to your side while in a flat bed without using bedrails?: A Little Help needed moving from lying on your back to sitting on the side of a flat bed without using bedrails?: A Little Help needed moving to and from a bed to a chair (including a wheelchair)?: A Lot Help needed standing up from a chair using your arms (e.g., wheelchair or bedside chair)?: A Lot Help needed to walk in hospital room?: Total Help needed climbing 3-5 steps with a railing? : Total 6 Click Score: 12    End of Session Equipment Utilized During Treatment: Gait belt Activity Tolerance: Treatment limited secondary to medical complications (Comment) (BP) Patient left: in bed;with call bell/phone within reach;with bed alarm set Nurse Communication: Mobility status PT Visit Diagnosis: Other abnormalities of gait and mobility (R26.89);Muscle weakness (generalized) (M62.81);Dizziness and giddiness (R42)     Time: 0981-1914 PT Time Calculation (min) (ACUTE ONLY): 24 min  Charges:  $Therapeutic Exercise: 8-22 mins $Therapeutic Activity: 8-22 mins                      Tori Kayce Chismar PT, DPT 07/20/22, 9:48 AM

## 2022-07-20 NOTE — TOC Transition Note (Signed)
Transition of Care [facility]) - CM/SW Discharge Note   Patient Details  Name: Vincent [name] MRN: [id number] Date of Birth: Jul 29, 1965  [facility]) CM/SW Contact:  Larrie Kass, LCSW Phone Number: 07/20/2022, 10:24 AM   Clinical Narrative:    CSW spoke with pt about PT recommendations, pt reports going to OP PT/ OT. Pt stated he would like to continue this and declined SNF placement. Pt reports his sister provided transportation to his OP appointments. Pt reports his sister will pick him up at d/c. No additional need TOC sign-off.    Final next level of care: Home/Self Care Barriers to Discharge: No Barriers Identified   Patient Goals and CMS Choice      Discharge Placement                      Patient and family notified of of transfer: 07/20/22  Discharge Plan and Services Additional resources added to the After Visit Summary for                                       Social Determinants of Health (SDOH) Interventions SDOH Screenings   Food Insecurity: No Food Insecurity (07/18/2022)  Housing: Low Risk  (07/18/2022)  Transportation Needs: No Transportation Needs (07/18/2022)  Utilities: Not At Risk (07/18/2022)  Alcohol Screen: Low Risk  (07/31/2021)  Tobacco Use: High Risk (07/18/2022)     Readmission Risk Interventions    05/26/2022    1:26 PM  Readmission Risk Prevention Plan  PCP or Specialist Appt within 3-5 Days Complete  HRI or Home Care Consult Complete  Social Work Consult for Recovery Care Planning/Counseling Complete  Palliative Care Screening Not Applicable  Medication Review Oceanographer) Complete

## 2022-07-20 NOTE — Discharge Summary (Signed)
Physician Discharge Summary  Vincent [name] ZOX:[id number] DOB: September 30, 1965 DOA: 07/18/2022  PCP: Dois Davenport, MD  Admit date: 07/18/2022 Discharge date: 07/20/2022  Admitted From: Home Disposition:  Home  Recommendations for Outpatient Follow-up:  Follow up with PCP in 1-2 weeks Please obtain BMP/CBC in one week your next doctors visit.  Discontinue losartan Advised to wear bilateral lower extremity knee-high compression stockings   Discharge Condition: Stable CODE STATUS: Full code Diet recommendation: Diabetic  Brief/Interim Summary: 57 y.o. male with medical history significant of uncontrolled diabetes with A1c of more than 15, anxiety disorder, chronic pain syndrome, history of bladder cancer, bipolar 2 disorder, coronary artery disease, chronic pain syndrome, COPD, GERD, history of gastroparesis was brought in with lightheadedness and hypotension.  In PCPs office patient's systolic blood pressure was noted to be in 60s.  CT of the head was unremarkable.  PT/OT has recommended SNF.  Echocardiogram showed preserved ejection fraction of 60%.  Acute kidney injury resolved with IV fluids.  TSH and random cortisol are normal.  Advised to wear knee-high compression stockings.  On 5/9 patient was still orthostatic.  I met with the patient and explained him why he needs to wear compression stockings and explained to him that potential uncontrolled diabetes/autonomic dysfunction may be contributing to his orthostasis.  Further it can be complicated by hyperglycemia leading to dehydration.  At this time patient is adamant that he wants to be discharged despite of him understanding that he is orthostatic and at risk of falling from presyncope/syncope leading to any life-threatening injuries.  Patient understands this and still wants to be discharged.     Assessment & Plan:  Principal Problem:   Syncope and collapse Active Problems:   Hypotension   AKI (acute kidney injury) (HCC)   Bipolar II  disorder, most recent episode major depressive (HCC)   Chronic pain   DM type 2 with diabetic peripheral neuropathy (HCC)   History of seizure   Protein-calorie malnutrition, severe (HCC)   Hyperlipidemia   Syncope     Hypotension with syncope History of hypertension, uncontrolled - I suspect this is combination of dehydration along with positional vertigo/autonomic dysfunction.  He does have uncontrolled diabetes - Echocardiogram shows preserved ejection fraction, TSH and random cortisol are normal.  Recommended knee-high compression stockings. - Discontinue losartan Orthostasis this persist.  Patient wants to be discharged despite of my explanation the importance of staying in the hospital for further evaluation and management with compression stockings and eventually may require medication.  He still is adamant about leaving and does not want any further hospital care.   Acute kidney injury, resolved Baseline Cr 1.0.  Creatinine peaked at 1.83   Uncontrolled diabetes mellitus type 2, insulin-dependent - Remains uncontrolled, advised to remain compliant with his outpatient medication and following up with endocrinology     Bipolar disorder -Continue home medications   Seizure disorder? -Not on home medications. Unclear hx.    Rheumatoid arthritis - Plaquenil   Chronic pain syndrome   Hyperlipidemia Fenofibrate.    Previous of Stroke Like symptoms On Plavix placed be neuro dating back Sept 2022.    PT/OT-SNF.  TOC consulted Declined SNF, wants to be discharged    Discharge Diagnoses:  Principal Problem:   Syncope and collapse Active Problems:   Hypotension   AKI (acute kidney injury) (HCC)   Bipolar II disorder, most recent episode major depressive (HCC)   Chronic pain   DM type 2 with diabetic peripheral neuropathy (HCC)   History  of seizure   Protein-calorie malnutrition, severe (HCC)   Hyperlipidemia    Syncope      Consultations: None  Subjective: Seen and bedside.  Patient was orthostatic this morning.  Spoke with him regarding the plan but patient is adamant he wants to be discharged as he tells me he is tired of hearing that his diabetes is not controlled.  I explained to him that getting IV fluids, wearing compression stockings is part of his evaluation for orthostasis.  He continues to insist that he wants to be discharged and will eventually get second opinion from a different facility.  Discharge Exam: Vitals:   07/19/22 2034 07/20/22 0406  BP: (!) 160/67 (!) 165/87  Pulse: 90 84  Resp:    Temp: 98.8 F (37.1 C) 98.2 F (36.8 C)  SpO2:     Vitals:   07/19/22 1326 07/19/22 1335 07/19/22 2034 07/20/22 0406  BP: (!) 168/67 (!) 168/80 (!) 160/67 (!) 165/87  Pulse: 93 92 90 84  Resp:  18    Temp:  97.9 F (36.6 C) 98.8 F (37.1 C) 98.2 F (36.8 C)  TempSrc:  Oral Oral Oral  SpO2:  100%    Weight:      Height:        General: Pt is alert, awake, not in acute distress Cardiovascular: RRR, S1/S2 +, no rubs, no gallops Respiratory: CTA bilaterally, no wheezing, no rhonchi Abdominal: Soft, NT, ND, bowel sounds + Extremities: no edema, no cyanosis  Discharge Instructions   Allergies as of 07/20/2022       Reactions   Celecoxib Anaphylaxis, Swelling, Rash   Tongue swells   Hydrocodone Rash, Other (See Comments)   "blisters developed on arms"   Sulfa Antibiotics Rash   Sulfacetamide Sodium Rash        Medication List     STOP taking these medications    losartan 25 MG tablet Commonly known as: COZAAR       TAKE these medications    acetaminophen 500 MG tablet Commonly known as: TYLENOL Take 1,000 mg by mouth every 4 (four) hours as needed for moderate pain or headache.   clopidogrel 75 MG tablet Commonly known as: PLAVIX Take 1 tablet (75 mg total) by mouth daily.   dicyclomine 20 MG tablet Commonly known as: BENTYL Take 1 tablet (20 mg  total) by mouth 2 (two) times daily as needed (abdominal cramping).   DULoxetine 60 MG capsule Commonly known as: CYMBALTA Take 1 capsule (60 mg total) by mouth daily.   famotidine 20 MG tablet Commonly known as: PEPCID Take 1 tablet (20 mg total) by mouth 2 (two) times daily.   fenofibrate 160 MG tablet Take 160 mg by mouth at bedtime.   HumaLOG KwikPen 200 UNIT/ML KwikPen Generic drug: insulin lispro Inject 25 Units into the skin in the morning, at noon, and at bedtime.   hydroxychloroquine 200 MG tablet Commonly known as: PLAQUENIL Take 200 mg by mouth daily.   ibuprofen 200 MG tablet Commonly known as: ADVIL Take 600 mg by mouth 2 (two) times daily. PRN   lamoTRIgine 150 MG tablet Commonly known as: LAMICTAL Take 1 tablet (150 mg total) by mouth 2 (two) times daily.   lurasidone 20 MG Tabs tablet Commonly known as: LATUDA Take 40 mg by mouth at bedtime.   omeprazole 40 MG capsule Commonly known as: PRILOSEC Take 40 mg by mouth daily.   pregabalin 200 MG capsule Commonly known as: LYRICA Take 200 mg by mouth  3 (three) times daily.   rosuvastatin 10 MG tablet Commonly known as: CRESTOR Take 1 tablet (10 mg total) by mouth daily. What changed: when to take this   tamsulosin 0.4 MG Caps capsule Commonly known as: FLOMAX Take 0.4 mg by mouth at bedtime.   Toujeo Max SoloStar 300 UNIT/ML Solostar Pen Generic drug: insulin glargine (2 Unit Dial) Inject 100 Units into the skin daily. What changed: how much to take   traZODone 150 MG tablet Commonly known as: DESYREL Take 1 tablet (150 mg total) by mouth at bedtime as needed for sleep.   venlafaxine XR 150 MG 24 hr capsule Commonly known as: EFFEXOR-XR Take 1 capsule (150 mg total) by mouth daily with breakfast.   Vitamin D (Ergocalciferol) 1.25 MG (50000 UNIT) Caps capsule Commonly known as: DRISDOL Take 50,000 Units by mouth every Wednesday.        Follow-up Information     Dois Davenport, MD  Follow up in 1 week(s).   Specialty: Family Medicine Contact information: [address] [place] [address] [phone]                Allergies  Allergen Reactions   Celecoxib Anaphylaxis, Swelling and Rash    Tongue swells   Hydrocodone Rash and Other (See Comments)    "blisters developed on arms"    Sulfa Antibiotics Rash   Sulfacetamide Sodium Rash    You were cared for by a hospitalist during your hospital stay. If you have any questions about your discharge medications or the care you received while you were in the hospital after you are discharged, you can call the unit and asked to speak with the hospitalist on call if the hospitalist that took care of you is not available. Once you are discharged, your primary care physician will handle any further medical issues. Please note that no refills for any discharge medications will be authorized once you are discharged, as it is imperative that you return to your primary care physician (or establish a relationship with a primary care physician if you do not have one) for your aftercare needs so that they can reassess your need for medications and monitor your lab values.  You were cared for by a hospitalist during your hospital stay. If you have any questions about your discharge medications or the care you received while you were in the hospital after you are discharged, you can call the unit and asked to speak with the hospitalist on call if the hospitalist that took care of you is not available. Once you are discharged, your primary care physician will handle any further medical issues. Please note that NO REFILLS for any discharge medications will be authorized once you are discharged, as it is imperative that you return to your primary care physician (or establish a relationship with a primary care physician if you do not have one) for your aftercare needs so that they can reassess your need for medications and  monitor your lab values.  Please request your Prim.MD to go over all Hospital Tests and Procedure/Radiological results at the follow up, please get all Hospital records sent to your Prim MD by signing hospital release before you go home.  Get CBC, CMP, 2 view Chest X ray checked  by Primary MD during your next visit or SNF MD in 5-7 days ( we routinely change or add medications that can affect your baseline labs and fluid status, therefore we recommend that you get  the mentioned basic workup next visit with your PCP, your PCP may decide not to get them or add new tests based on their clinical decision)  On your next visit with your primary care physician please Get Medicines reviewed and adjusted.  If you experience worsening of your admission symptoms, develop shortness of breath, life threatening emergency, suicidal or homicidal thoughts you must seek medical attention immediately by calling 911 or calling your MD immediately  if symptoms less severe.  You Must read complete instructions/literature along with all the possible adverse reactions/side effects for all the Medicines you take and that have been prescribed to you. Take any new Medicines after you have completely understood and accpet all the possible adverse reactions/side effects.   Do not drive, operate heavy machinery, perform activities at heights, swimming or participation in water activities or provide baby sitting services if your were admitted for syncope or siezures until you have seen by Primary MD or a Neurologist and advised to do so again.  Do not drive when taking Pain medications.   Procedures/Studies: ECHOCARDIOGRAM COMPLETE  Result Date: 07/19/2022    ECHOCARDIOGRAM REPORT   Patient Name:   TYRESS MUNZER [facility] Date of Exam: 07/19/2022 Medical Rec #:  [id number]       Height:       72.0 in Accession #:    [id number]      Weight:       183.9 lb Date of Birth:  1965-11-04        BSA:          2.056 m Patient Age:    57 years         BP:           180/89 mmHg Patient Gender: M               HR:           91 bpm. Exam Location:  Inpatient Procedure: 2D Echo, Cardiac Doppler and Color Doppler Indications:    Cardiomyopathy - unspecified  History:        Patient has prior history of Echocardiogram examinations, most                 recent 11/15/2020. CAD, TIA and COPD; Risk Factors:Diabetes, Sleep                 Apnea, Dyslipidemia and Hypertension. Bladder CA.  Sonographer:    Milda Smart Referring Phys: [id number] Plumer Mittelstaedt CHIRAG Glendoris Nodarse  Sonographer Comments: Image acquisition challenging due to patient body habitus and Image acquisition challenging due to respiratory motion. IMPRESSIONS  1. Left ventricular ejection fraction, by estimation, is 55 to 60%. The left ventricle has normal function. The left ventricle has no regional wall motion abnormalities. There is mild concentric left ventricular hypertrophy. Left ventricular diastolic parameters are consistent with Grade I diastolic dysfunction (impaired relaxation).  2. Right ventricular systolic function is normal. The right ventricular size is normal. Tricuspid regurgitation signal is inadequate for assessing PA pressure.  3. The mitral valve is normal in structure. No evidence of mitral valve regurgitation. No evidence of mitral stenosis.  4. The aortic valve is tricuspid. Aortic valve regurgitation is not visualized. No aortic stenosis is present.  5. The inferior vena cava is normal in size with greater than 50% respiratory variability, suggesting right atrial pressure of 3 mmHg. FINDINGS  Left Ventricle: Left ventricular ejection fraction, by estimation, is 55 to 60%. The left ventricle has normal function. The left ventricle has  no regional wall motion abnormalities. The left ventricular internal cavity size was normal in size. There is  mild concentric left ventricular hypertrophy. Left ventricular diastolic parameters are consistent with Grade I diastolic dysfunction (impaired  relaxation). Right Ventricle: The right ventricular size is normal. No increase in right ventricular wall thickness. Right ventricular systolic function is normal. Tricuspid regurgitation signal is inadequate for assessing PA pressure. Left Atrium: Left atrial size was normal in size. Right Atrium: Right atrial size was normal in size. Pericardium: There is no evidence of pericardial effusion. Mitral Valve: The mitral valve is normal in structure. No evidence of mitral valve regurgitation. No evidence of mitral valve stenosis. MV peak gradient, 4.5 mmHg. The mean mitral valve gradient is 2.0 mmHg. Tricuspid Valve: The tricuspid valve is normal in structure. Tricuspid valve regurgitation is not demonstrated. Aortic Valve: The aortic valve is tricuspid. Aortic valve regurgitation is not visualized. No aortic stenosis is present. Pulmonic Valve: The pulmonic valve was normal in structure. Pulmonic valve regurgitation is not visualized. Aorta: The aortic root is normal in size and structure. Venous: The inferior vena cava is normal in size with greater than 50% respiratory variability, suggesting right atrial pressure of 3 mmHg. IAS/Shunts: No atrial level shunt detected by color flow Doppler.  LEFT VENTRICLE PLAX 2D LVIDd:         4.00 cm      Diastology LVIDs:         2.90 cm      LV e' medial:    6.85 cm/s LV PW:         1.20 cm      LV E/e' medial:  9.7 LV IVS:        1.20 cm      LV e' lateral:   9.90 cm/s LVOT diam:     2.40 cm      LV E/e' lateral: 6.7 LV SV:         91 LV SV Index:   44 LVOT Area:     4.52 cm  LV Volumes (MOD) LV vol d, MOD A2C: 74.1 ml LV vol d, MOD A4C: 107.0 ml LV vol s, MOD A2C: 29.0 ml LV vol s, MOD A4C: 45.8 ml LV SV MOD A2C:     45.1 ml LV SV MOD A4C:     107.0 ml LV SV MOD BP:      54.3 ml RIGHT VENTRICLE             IVC RV S prime:     14.30 cm/s  IVC diam: 1.60 cm TAPSE (M-mode): 2.8 cm LEFT ATRIUM             Index        RIGHT ATRIUM           Index LA diam:        3.70 cm 1.80  cm/m   RA Area:     11.30 cm LA Vol (A2C):   40.0 ml 19.46 ml/m  RA Volume:   23.50 ml  11.43 ml/m LA Vol (A4C):   45.5 ml 22.13 ml/m LA Biplane Vol: 44.7 ml 21.74 ml/m  AORTIC VALVE LVOT Vmax:   95.55 cm/s LVOT Vmean:  71.850 cm/s LVOT VTI:    0.201 m  AORTA Ao Root diam: 3.40 cm Ao Asc diam:  3.30 cm MITRAL VALVE MV Area (PHT): 2.10 cm    SHUNTS MV Area VTI:   4.66 cm    Systemic VTI:  0.20 m  MV Peak grad:  4.5 mmHg    Systemic Diam: 2.40 cm MV Mean grad:  2.0 mmHg MV Vmax:       1.06 m/s MV Vmean:      75.1 cm/s MV Decel Time: 362 msec MV E velocity: 66.70 cm/s MV A velocity: 89.40 cm/s MV E/A ratio:  0.75 Dalton McleanMD Electronically signed by Wilfred Lacy Signature Date/Time: 07/19/2022/12:52:46 PM    Final    CT Head Wo Contrast  Result Date: 07/18/2022 CLINICAL DATA:  Head trauma, moderate-severe. Dizziness and syncope. Fell to the floor. EXAM: CT HEAD WITHOUT CONTRAST TECHNIQUE: Contiguous axial images were obtained from the base of the skull through the vertex without intravenous contrast. RADIATION DOSE REDUCTION: This exam was performed according to the departmental dose-optimization program which includes automated exposure control, adjustment of the mA and/or kV according to patient size and/or use of iterative reconstruction technique. COMPARISON:  05/23/2022 FINDINGS: Brain: The brain shows a normal appearance without evidence of malformation, atrophy, old or acute small or large vessel infarction, mass lesion, hemorrhage, hydrocephalus or extra-axial collection. Vascular: No hyperdense vessel. No evidence of atherosclerotic calcification. Skull: Normal.  No traumatic finding.  No focal bone lesion. Sinuses/Orbits: Sinuses are clear. Orbits appear normal. Mastoids are clear. Other: None significant IMPRESSION: Normal head CT. Electronically Signed   By: Paulina Fusi M.D.   On: 07/18/2022 14:47   CT ABDOMEN PELVIS W CONTRAST  Result Date: 06/21/2022 CLINICAL DATA:  Right upper  epigastric and lower quadrant pain EXAM: CT ABDOMEN AND PELVIS WITH CONTRAST TECHNIQUE: Multidetector CT imaging of the abdomen and pelvis was performed using the standard protocol following bolus administration of intravenous contrast. RADIATION DOSE REDUCTION: This exam was performed according to the departmental dose-optimization program which includes automated exposure control, adjustment of the mA and/or kV according to patient size and/or use of iterative reconstruction technique. CONTRAST:  85mL OMNIPAQUE IOHEXOL 300 MG/ML  SOLN COMPARISON:  CT 04/05/2022 FINDINGS: Lower chest: No acute abnormality. Hepatobiliary: No focal liver abnormality is seen. Status post cholecystectomy. No biliary dilatation. Pancreas: Unremarkable. No pancreatic ductal dilatation or surrounding inflammatory changes. Spleen: Normal in size without focal abnormality. Adrenals/Urinary Tract: Adrenal glands are unremarkable. Kidneys are normal, without renal calculi, focal lesion, or hydronephrosis. Bladder is slightly thick walled. Stomach/Bowel: Stomach is within normal limits. Appendix appears normal. No evidence of bowel wall thickening, distention, or inflammatory changes. Vascular/Lymphatic: Moderate aortic atherosclerosis. No aneurysm. No suspicious lymph nodes. Reproductive: Prostate is unremarkable. Other: Negative for pelvic effusion or free air. Musculoskeletal: No acute or significant osseous findings. IMPRESSION: 1. No CT evidence for acute intra-abdominal or pelvic abnormality. 2. Slightly thick-walled appearance of the urinary bladder, correlate for cystitis. 3. Aortic atherosclerosis. Aortic Atherosclerosis (ICD10-I70.0). Electronically Signed   By: Jasmine Pang M.D.   On: 06/21/2022 16:38     The results of significant diagnostics from this hospitalization (including imaging, microbiology, ancillary and laboratory) are listed below for reference.     Microbiology: Recent Results (from the past 240 hour(s))   MRSA Next Gen by PCR, Nasal     Status: Abnormal   Collection Time: 07/18/22  8:44 PM   Specimen: Nasal Mucosa; Nasal Swab  Result Value Ref Range Status   MRSA by PCR Next Gen DETECTED (A) NOT DETECTED Final    Comment: (NOTE) The GeneXpert MRSA Assay (FDA approved for NASAL specimens only), is one component of a comprehensive MRSA colonization surveillance program. It is not intended to diagnose MRSA infection nor to guide or monitor  treatment for MRSA infections. Test performance is not FDA approved in patients less than 74 years old. Performed at [facility], [address] [place], [place] [address]      Labs: BNP (last 3 results) No results for input(s): "BNP" in the last 8760 hours. Basic Metabolic Panel: Recent Labs  Lab 07/18/22 1343 07/19/22 0413 07/20/22 0432  NA 133* 134* 135  K 4.2 3.6 4.1  CL 101 104 105  CO2 23 21* 23  GLUCOSE 195* 94 177*  BUN 28* 24* 15  CREATININE 1.83* 1.43* 0.96  CALCIUM 8.8* 8.1* 8.5*  MG  --   --  1.7   Liver Function Tests: Recent Labs  Lab 07/18/22 1343 07/19/22 0413  AST 13* 12*  ALT 13 10  ALKPHOS 83 61  BILITOT 0.4 0.4  PROT 7.0 5.6*  ALBUMIN 3.3* 2.8*   No results for input(s): "LIPASE", "AMYLASE" in the last 168 hours. No results for input(s): "AMMONIA" in the last 168 hours. CBC: Recent Labs  Lab 07/18/22 1343 07/19/22 0413 07/20/22 0432  WBC 11.1* 7.7 7.9  NEUTROABS 6.8  --   --   HGB 12.7* 11.0* 12.0*  HCT 36.8* 32.4* 35.4*  MCV 82.1 82.9 83.7  PLT 347 328 295   Cardiac Enzymes: Recent Labs  Lab 07/18/22 1755  CKTOTAL 64   BNP: Invalid input(s): "POCBNP" CBG: Recent Labs  Lab 07/19/22 0744 07/19/22 1135 07/19/22 1707 07/19/22 2037 07/20/22 0729  GLUCAP 149* 206* 176* 222* 189*   D-Dimer No results for input(s): "DDIMER" in the last 72 hours. Hgb A1c No results for input(s): "HGBA1C" in the last 72 hours. Lipid Profile No results for input(s): "CHOL", "HDL",  "LDLCALC", "TRIG", "CHOLHDL", "LDLDIRECT" in the last 72 hours. Thyroid function studies Recent Labs    07/18/22 1804  TSH 1.479   Anemia work up No results for input(s): "VITAMINB12", "FOLATE", "FERRITIN", "TIBC", "IRON", "RETICCTPCT" in the last 72 hours. Urinalysis    Component Value Date/Time   COLORURINE COLORLESS (A) 06/21/2022 1830   APPEARANCEUR CLEAR 06/21/2022 1830   LABSPEC <1.005 (L) 06/21/2022 1830   PHURINE 5.5 06/21/2022 1830   GLUCOSEU NEGATIVE 06/21/2022 1830   HGBUR TRACE (A) 06/21/2022 1830   BILIRUBINUR NEGATIVE 06/21/2022 1830   KETONESUR NEGATIVE 06/21/2022 1830   PROTEINUR 30 (A) 06/21/2022 1830   UROBILINOGEN 1.0 03/04/2014 2312   NITRITE NEGATIVE 06/21/2022 1830   LEUKOCYTESUR NEGATIVE 06/21/2022 1830   Sepsis Labs Recent Labs  Lab 07/18/22 1343 07/19/22 0413 07/20/22 0432  WBC 11.1* 7.7 7.9   Microbiology Recent Results (from the past 240 hour(s))  MRSA Next Gen by PCR, Nasal     Status: Abnormal   Collection Time: 07/18/22  8:44 PM   Specimen: Nasal Mucosa; Nasal Swab  Result Value Ref Range Status   MRSA by PCR Next Gen DETECTED (A) NOT DETECTED Final    Comment: (NOTE) The GeneXpert MRSA Assay (FDA approved for NASAL specimens only), is one component of a comprehensive MRSA colonization surveillance program. It is not intended to diagnose MRSA infection nor to guide or monitor treatment for MRSA infections. Test performance is not FDA approved in patients less than 89 years old. Performed at [facility], [address], [place], [place] [address]      Time coordinating discharge:  I have spent 35 minutes face to face with the patient and on the ward discussing the patients care, assessment, plan and disposition with other care givers. >50% of the time  was devoted counseling the patient about the risks and benefits of treatment/Discharge disposition and coordinating care.   SIGNED:   Dimple Nanas,  MD  [facility] 07/20/2022, 12:14 PM   If 7PM-7AM, please contact night-coverage

## 2022-07-24 ENCOUNTER — Ambulatory Visit: Payer: Medicare HMO | Admitting: Physical Therapy

## 2022-07-24 ENCOUNTER — Encounter: Payer: Medicare HMO | Admitting: Occupational Therapy

## 2022-07-27 ENCOUNTER — Ambulatory Visit: Payer: Medicare HMO | Admitting: Physical Therapy

## 2022-07-27 ENCOUNTER — Ambulatory Visit: Payer: Medicare HMO | Admitting: Occupational Therapy

## 2022-08-10 ENCOUNTER — Encounter: Payer: Medicare HMO | Admitting: Occupational Therapy

## 2022-08-11 ENCOUNTER — Emergency Department (HOSPITAL_BASED_OUTPATIENT_CLINIC_OR_DEPARTMENT_OTHER)
Admission: EM | Admit: 2022-08-11 | Discharge: 2022-08-11 | Disposition: A | Discharge status: Home / Self Care | Payer: Medicare HMO | Attending: Emergency Medicine | Admitting: Emergency Medicine

## 2022-08-11 ENCOUNTER — Encounter (HOSPITAL_BASED_OUTPATIENT_CLINIC_OR_DEPARTMENT_OTHER): Payer: Self-pay

## 2022-08-11 ENCOUNTER — Other Ambulatory Visit: Payer: Self-pay

## 2022-08-11 DIAGNOSIS — Z794 Long term (current) use of insulin: Secondary | ICD-10-CM | POA: Diagnosis not present

## 2022-08-11 DIAGNOSIS — E119 Type 2 diabetes mellitus without complications: Secondary | ICD-10-CM | POA: Insufficient documentation

## 2022-08-11 DIAGNOSIS — I2510 Atherosclerotic heart disease of native coronary artery without angina pectoris: Secondary | ICD-10-CM | POA: Diagnosis not present

## 2022-08-11 DIAGNOSIS — L0231 Cutaneous abscess of buttock: Secondary | ICD-10-CM | POA: Insufficient documentation

## 2022-08-11 DIAGNOSIS — Z8551 Personal history of malignant neoplasm of bladder: Secondary | ICD-10-CM | POA: Diagnosis not present

## 2022-08-11 DIAGNOSIS — Z7902 Long term (current) use of antithrombotics/antiplatelets: Secondary | ICD-10-CM | POA: Insufficient documentation

## 2022-08-11 DIAGNOSIS — J449 Chronic obstructive pulmonary disease, unspecified: Secondary | ICD-10-CM | POA: Diagnosis not present

## 2022-08-11 MED ORDER — CLINDAMYCIN HCL 300 MG PO CAPS: 300.0000 mg | ORAL_CAPSULE | Freq: Three times a day (TID) | ORAL | 0 refills | Status: AC

## 2022-08-11 MED ORDER — CLINDAMYCIN HCL 150 MG PO CAPS
300.0000 mg | ORAL_CAPSULE | ORAL | Status: AC
  Administered 2022-08-11: 300 mg via ORAL
  Filled 2022-08-11: qty 2

## 2022-08-11 MED ORDER — OXYCODONE HCL 5 MG PO TABS
5.0000 mg | ORAL_TABLET | ORAL | Status: AC
  Administered 2022-08-11: 5 mg via ORAL
  Filled 2022-08-11: qty 1

## 2022-08-11 MED ORDER — LIDOCAINE HCL (PF) 1 % IJ SOLN
20.0000 mL | Freq: Once | INTRAMUSCULAR | Status: AC
  Administered 2022-08-11: 20 mL
  Filled 2022-08-11: qty 20

## 2022-08-11 MED ORDER — OXYCODONE HCL 5 MG PO TABS: 5.0000 mg | ORAL_TABLET | ORAL | 0 refills | Status: DC | PRN

## 2022-08-11 NOTE — ED Notes (Signed)
RN reviewed discharge instructions with pt. Pt verbalized understanding and had no further questions. VSS upon discharge.  

## 2022-08-11 NOTE — ED Provider Notes (Signed)
[name] EMERGENCY DEPARTMENT AT [facility] Provider Note   CSN: [id number] Arrival date & time: 08/11/22  1447     History  Chief Complaint  Patient presents with   Abscess    Vincent [name] is a 57 y.o. male.  57 year old male with history of diabetes, bladder cancer, CAD, COPD, gastroparesis who presents to the emergency department with abscess on buttocks.  For weeks has noticed that he has a red indurated area on his right gluteus.  Says that it has been getting more painful and his sister who is his caretaker saw today and noticed some pus draining from it so brought him into the emergency department for evaluation.  No pain with defecation around his rectum.  No fevers.  Has had these before and has had to have them drained in the past.       Home Medications Prior to Admission medications   Medication Sig Start Date End Date Taking? Authorizing Provider  clindamycin (CLEOCIN) 300 MG capsule Take 1 capsule (300 mg total) by mouth 3 (three) times daily for 10 days. 08/11/22 08/21/22 Yes Rondel Baton, MD  oxyCODONE (ROXICODONE) 5 MG immediate release tablet Take 1 tablet (5 mg total) by mouth every 4 (four) hours as needed for severe pain. 08/11/22  Yes Rondel Baton, MD  acetaminophen (TYLENOL) 500 MG tablet Take 1,000 mg by mouth every 4 (four) hours as needed for moderate pain or headache.    [provider]  clopidogrel (PLAVIX) 75 MG tablet Take 1 tablet (75 mg total) by mouth daily. 10/12/21   Glean Salvo, NP  dicyclomine (BENTYL) 20 MG tablet Take 1 tablet (20 mg total) by mouth 2 (two) times daily as needed (abdominal cramping). 06/21/22   Mardene Sayer, MD  DULoxetine (CYMBALTA) 60 MG capsule Take 1 capsule (60 mg total) by mouth daily. 10/12/21   Glean Salvo, NP  famotidine (PEPCID) 20 MG tablet Take 1 tablet (20 mg total) by mouth 2 (two) times daily. 06/21/22   Mardene Sayer, MD  fenofibrate 160 MG tablet Take 160 mg by mouth  at bedtime. 12/31/19   [provider]  HUMALOG KWIKPEN 200 UNIT/ML KwikPen Inject 25 Units into the skin in the morning, at noon, and at bedtime. 06/17/21   [provider]  hydroxychloroquine (PLAQUENIL) 200 MG tablet Take 200 mg by mouth daily. 04/27/22   [provider]  ibuprofen (ADVIL) 200 MG tablet Take 600 mg by mouth 2 (two) times daily. PRN    [provider]  insulin glargine, 2 Unit Dial, (TOUJEO MAX SOLOSTAR) 300 UNIT/ML Solostar Pen Inject 100 Units into the skin daily. Patient taking differently: Inject 80 Units into the skin daily. 05/27/22   Calvert Cantor, MD  lamoTRIgine (LAMICTAL) 150 MG tablet Take 1 tablet (150 mg total) by mouth 2 (two) times daily. 10/12/21   Glean Salvo, NP  lurasidone (LATUDA) 20 MG TABS tablet Take 40 mg by mouth at bedtime.    [provider]  omeprazole (PRILOSEC) 40 MG capsule Take 40 mg by mouth daily. 08/23/21   [provider]  pregabalin (LYRICA) 200 MG capsule Take 200 mg by mouth 3 (three) times daily. 12/25/21   Dois Davenport, MD  rosuvastatin (CRESTOR) 10 MG tablet Take 1 tablet (10 mg total) by mouth daily. Patient taking differently: Take 10 mg by mouth at bedtime. 08/04/21 07/18/22  Lauro Franklin, MD  tamsulosin (FLOMAX) 0.4 MG CAPS capsule Take 0.4  mg by mouth at bedtime.    [provider]  traZODone (DESYREL) 150 MG tablet Take 1 tablet (150 mg total) by mouth at bedtime as needed for sleep. 01/25/22   Arfeen, Phillips Grout, MD  venlafaxine XR (EFFEXOR-XR) 150 MG 24 hr capsule Take 1 capsule (150 mg total) by mouth daily with breakfast. 01/25/22   Arfeen, Phillips Grout, MD  Vitamin D, Ergocalciferol, (DRISDOL) 1.25 MG (50000 UNIT) CAPS capsule Take 50,000 Units by mouth every Wednesday. 12/26/19   [provider]      Allergies    Celecoxib, Hydrocodone, Sulfa antibiotics, and Sulfacetamide sodium    Review of Systems   Review of Systems  Physical Exam Updated Vital  Signs BP (!) 184/81   Pulse 100   Temp 98.6 F (37 C) (Oral)   Resp 18   Ht 6' (1.829 m)   Wt 79.4 kg   SpO2 99%   BMI 23.73 kg/m  Physical Exam Vitals and nursing note reviewed.  Constitutional:      General: He is not in acute distress.    Appearance: He is well-developed.  HENT:     Head: Normocephalic and atraumatic.     Right Ear: External ear normal.     Left Ear: External ear normal.     Nose: Nose normal.  Eyes:     Extraocular Movements: Extraocular movements intact.     Conjunctiva/sclera: Conjunctivae normal.     Pupils: Pupils are equal, round, and reactive to light.  Genitourinary:    Comments: Erythema and induration around superior aspect of right medial gluteus.  Approximately 6 x 6 cm area.  Fluctuance palpated and is spontaneously draining purulent material.  Does not appear to be surrounding the rectum at all or adjacent to the rectum. Musculoskeletal:     Cervical back: Normal range of motion and neck supple.     Right lower leg: No edema.     Left lower leg: No edema.  Neurological:     Mental Status: He is alert. Mental status is at baseline.  Psychiatric:        Mood and Affect: Mood normal.        Behavior: Behavior normal.     ED Results / Procedures / Treatments   Labs (all labs ordered are listed, but only abnormal results are displayed) Labs Reviewed - No data to display  EKG None  Radiology No results found.  Procedures .Marland KitchenIncision and Drainage  Date/Time: 08/11/2022 5:42 PM  Performed by: Rondel Baton, MD Authorized by: Rondel Baton, MD   Consent:    Consent obtained:  Verbal   Consent given by:  Patient   Risks, benefits, and alternatives were discussed: yes     Risks discussed:  Bleeding, incomplete drainage, pain, damage to other organs and infection Location:    Type:  Abscess   Size:  5   Location: Right superior medial gluteus. Anesthesia:    Anesthesia method:  Local infiltration   Local anesthetic:   Lidocaine 1% w/o epi Procedure type:    Complexity:  Simple Procedure details:    Ultrasound guidance: yes     Needle aspiration: no     Incision types:  Single straight   Wound management:  Probed and deloculated   Drainage:  Bloody and purulent   Drainage amount:  Copious   Wound treatment:  Drain placed and wound left open   Packing materials:  1/4 in iodoform gauze   Amount 1/4" iodoform:  6  cm Post-procedure details:    Procedure completion:  Tolerated well, no immediate complications   EMERGENCY DEPARTMENT US SOFT TISSUE INTERPRETATION "Study: Limited Soft Tissue Ultrasound"  INDICATIONS: Soft tissue infection Multiple views of the body part were obtained in real-time with a multi-frequency linear probe  PERFORMED BY: Myself IMAGES ARCHIVED?: No SIDE:Right  BODY PART: Right gluteus INTERPRETATION:  Abcess present    Medications Ordered in ED Medications  lidocaine (PF) (XYLOCAINE) 1 % injection 20 mL (20 mLs Other Given 08/11/22 1557)  clindamycin (CLEOCIN) capsule 300 mg (300 mg Oral Given 08/11/22 1556)  oxyCODONE (Oxy IR/ROXICODONE) immediate release tablet 5 mg (5 mg Oral Given 08/11/22 1556)    ED Course/ Medical Decision Making/ A&P                             Medical Decision Making Risk Prescription drug management.   Vincent [name] is a 57 y.o. male with comorbidities that complicate the patient evaluation including diabetes, bladder cancer, CAD, COPD, gastroparesis who presents to the emergency department with abscess on buttocks.   Initial Ddx:  Gluteal abscess, perirectal abscess, sepsis, cellulitis  MDM:  Appears the patient has a gluteal abscess and surrounding cellulitis at this time.  Does not appear to communicate with the rectum so low concern for perirectal abscess do not feel that the patient needs imaging at this time.  Not having fevers at home and vital signs WNL so low concern for sepsis requiring IV antibiotics or labs.  Plan:   Incision and drainage Antibiotics  ED Summary/Re-evaluation:  Abscess drained under ultrasound guidance and had copious amount of purulent material and some bloody material that was expressed.  Patient given prescription for clindamycin and instructed to take Tylenol and ibuprofen and oxycodone for any breakthrough pain.  Informed him that he will need to follow-up for a wound check in 3 days with his primary doctor and if he is unable to to go to urgent care the emergency department.  This patient presents to the ED for concern of complaints listed in HPI, this involves an extensive number of treatment options, and is a complaint that carries with it a high risk of complications and morbidity. Disposition including potential need for admission considered.   Dispo: DC Home. Return precautions discussed including, but not limited to, those listed in the AVS. Allowed pt time to ask questions which were answered fully prior to dc.  Additional history obtained from family Records reviewed Outpatient Clinic Notes I have reviewed the patients home medications and made adjustments as needed        Final Clinical Impression(s) / ED Diagnoses Final diagnoses:  Abscess, gluteal, right    Rx / DC Orders ED Discharge Orders          Ordered    clindamycin (CLEOCIN) 300 MG capsule  3 times daily        08/11/22 1734    oxyCODONE (ROXICODONE) 5 MG immediate release tablet  Every 4 hours PRN        08/11/22 1741              Rondel Baton, MD 08/11/22 1745

## 2022-08-11 NOTE — ED Triage Notes (Signed)
Patient here POV from Home.  Endorses Abscess to Buttocks (More Rectal). Present for approximately a Month or so. Some Drainage. No Known Fevers.   NAD Noted during Triage. A&Ox4. GCS 15. BIB Wheelchair

## 2022-08-11 NOTE — ED Notes (Signed)
ED Provider at bedside. 

## 2022-08-11 NOTE — Discharge Instructions (Addendum)
You were seen for your gluteal abscess in the emergency department.   At home, please keep the packing in place until you follow-up with your primary doctor or the emergency department in 3 days for a wound check.  Please take the clindamycin we have prescribed you for the infection.    Take Tylenol and ibuprofen for your pain. You may also take the oxycodone we have prescribed you for any breakthrough pain that may have.  Do not take this before driving or operating heavy machinery.  Do not take this medication with alcohol.  Follow-up with your primary doctor in 3 days regarding your visit for a wound check.  If you are unable to see them you may go back to urgent care or the emergency department.  Return immediately to the emergency department if you experience any of the following: Fevers, worsening redness of your wound, or any other concerning symptoms.    Thank you for visiting our Emergency Department. It was a pleasure taking care of you today.

## 2022-08-14 ENCOUNTER — Encounter: Payer: Medicare HMO | Admitting: Occupational Therapy

## 2022-08-17 ENCOUNTER — Encounter: Payer: Medicare HMO | Admitting: Occupational Therapy

## 2022-08-21 ENCOUNTER — Encounter: Payer: Medicare HMO | Admitting: Occupational Therapy

## 2022-08-24 ENCOUNTER — Encounter: Payer: Medicare HMO | Admitting: Occupational Therapy

## 2022-08-25 ENCOUNTER — Emergency Department (HOSPITAL_COMMUNITY): Payer: Medicare HMO

## 2022-08-25 ENCOUNTER — Inpatient Hospital Stay (HOSPITAL_COMMUNITY)
Admission: EM | Admit: 2022-08-25 | Discharge: 2022-08-29 | DRG: 091 | Disposition: A | Discharge status: Home Health | Payer: Medicare HMO | Attending: Internal Medicine | Admitting: Internal Medicine

## 2022-08-25 ENCOUNTER — Other Ambulatory Visit: Payer: Self-pay

## 2022-08-25 DIAGNOSIS — Z89022 Acquired absence of left finger(s): Secondary | ICD-10-CM

## 2022-08-25 DIAGNOSIS — E785 Hyperlipidemia, unspecified: Secondary | ICD-10-CM | POA: Diagnosis present

## 2022-08-25 DIAGNOSIS — I951 Orthostatic hypotension: Secondary | ICD-10-CM | POA: Diagnosis present

## 2022-08-25 DIAGNOSIS — F3181 Bipolar II disorder: Secondary | ICD-10-CM | POA: Diagnosis present

## 2022-08-25 DIAGNOSIS — Z87442 Personal history of urinary calculi: Secondary | ICD-10-CM

## 2022-08-25 DIAGNOSIS — Z8249 Family history of ischemic heart disease and other diseases of the circulatory system: Secondary | ICD-10-CM

## 2022-08-25 DIAGNOSIS — D494 Neoplasm of unspecified behavior of bladder: Secondary | ICD-10-CM | POA: Diagnosis present

## 2022-08-25 DIAGNOSIS — Z882 Allergy status to sulfonamides status: Secondary | ICD-10-CM

## 2022-08-25 DIAGNOSIS — M069 Rheumatoid arthritis, unspecified: Secondary | ICD-10-CM | POA: Diagnosis present

## 2022-08-25 DIAGNOSIS — E1143 Type 2 diabetes mellitus with diabetic autonomic (poly)neuropathy: Secondary | ICD-10-CM | POA: Diagnosis present

## 2022-08-25 DIAGNOSIS — G928 Other toxic encephalopathy: Principal | ICD-10-CM | POA: Diagnosis present

## 2022-08-25 DIAGNOSIS — Z885 Allergy status to narcotic agent status: Secondary | ICD-10-CM

## 2022-08-25 DIAGNOSIS — Z87898 Personal history of other specified conditions: Secondary | ICD-10-CM

## 2022-08-25 DIAGNOSIS — G9340 Encephalopathy, unspecified: Secondary | ICD-10-CM | POA: Diagnosis present

## 2022-08-25 DIAGNOSIS — Z8739 Personal history of other diseases of the musculoskeletal system and connective tissue: Secondary | ICD-10-CM

## 2022-08-25 DIAGNOSIS — R296 Repeated falls: Secondary | ICD-10-CM | POA: Diagnosis present

## 2022-08-25 DIAGNOSIS — I2510 Atherosclerotic heart disease of native coronary artery without angina pectoris: Secondary | ICD-10-CM | POA: Diagnosis present

## 2022-08-25 DIAGNOSIS — E43 Unspecified severe protein-calorie malnutrition: Secondary | ICD-10-CM | POA: Diagnosis present

## 2022-08-25 DIAGNOSIS — E872 Acidosis, unspecified: Secondary | ICD-10-CM | POA: Diagnosis present

## 2022-08-25 DIAGNOSIS — Z833 Family history of diabetes mellitus: Secondary | ICD-10-CM

## 2022-08-25 DIAGNOSIS — E8809 Other disorders of plasma-protein metabolism, not elsewhere classified: Secondary | ICD-10-CM | POA: Diagnosis present

## 2022-08-25 DIAGNOSIS — Z87892 Personal history of anaphylaxis: Secondary | ICD-10-CM

## 2022-08-25 DIAGNOSIS — R7989 Other specified abnormal findings of blood chemistry: Secondary | ICD-10-CM

## 2022-08-25 DIAGNOSIS — Z8711 Personal history of peptic ulcer disease: Secondary | ICD-10-CM

## 2022-08-25 DIAGNOSIS — Z8619 Personal history of other infectious and parasitic diseases: Secondary | ICD-10-CM

## 2022-08-25 DIAGNOSIS — E1165 Type 2 diabetes mellitus with hyperglycemia: Secondary | ICD-10-CM | POA: Diagnosis present

## 2022-08-25 DIAGNOSIS — G250 Essential tremor: Secondary | ICD-10-CM | POA: Diagnosis present

## 2022-08-25 DIAGNOSIS — Z811 Family history of alcohol abuse and dependence: Secondary | ICD-10-CM

## 2022-08-25 DIAGNOSIS — Z91119 Patient's noncompliance with dietary regimen due to unspecified reason: Secondary | ICD-10-CM

## 2022-08-25 DIAGNOSIS — Z8551 Personal history of malignant neoplasm of bladder: Secondary | ICD-10-CM

## 2022-08-25 DIAGNOSIS — J449 Chronic obstructive pulmonary disease, unspecified: Secondary | ICD-10-CM | POA: Diagnosis present

## 2022-08-25 DIAGNOSIS — Z8673 Personal history of transient ischemic attack (TIA), and cerebral infarction without residual deficits: Secondary | ICD-10-CM

## 2022-08-25 DIAGNOSIS — N1831 Chronic kidney disease, stage 3a: Secondary | ICD-10-CM | POA: Diagnosis present

## 2022-08-25 DIAGNOSIS — T428X5A Adverse effect of antiparkinsonism drugs and other central muscle-tone depressants, initial encounter: Secondary | ICD-10-CM | POA: Diagnosis present

## 2022-08-25 DIAGNOSIS — G894 Chronic pain syndrome: Secondary | ICD-10-CM | POA: Diagnosis present

## 2022-08-25 DIAGNOSIS — Z79899 Other long term (current) drug therapy: Secondary | ICD-10-CM

## 2022-08-25 DIAGNOSIS — N179 Acute kidney failure, unspecified: Secondary | ICD-10-CM | POA: Diagnosis present

## 2022-08-25 DIAGNOSIS — F0393 Unspecified dementia, unspecified severity, with mood disturbance: Secondary | ICD-10-CM | POA: Diagnosis present

## 2022-08-25 DIAGNOSIS — K219 Gastro-esophageal reflux disease without esophagitis: Secondary | ICD-10-CM | POA: Diagnosis present

## 2022-08-25 DIAGNOSIS — R079 Chest pain, unspecified: Secondary | ICD-10-CM | POA: Diagnosis present

## 2022-08-25 DIAGNOSIS — E1142 Type 2 diabetes mellitus with diabetic polyneuropathy: Secondary | ICD-10-CM | POA: Diagnosis present

## 2022-08-25 DIAGNOSIS — L0231 Cutaneous abscess of buttock: Secondary | ICD-10-CM | POA: Diagnosis present

## 2022-08-25 DIAGNOSIS — Z6824 Body mass index (BMI) 24.0-24.9, adult: Secondary | ICD-10-CM

## 2022-08-25 DIAGNOSIS — F4312 Post-traumatic stress disorder, chronic: Secondary | ICD-10-CM | POA: Diagnosis present

## 2022-08-25 DIAGNOSIS — D649 Anemia, unspecified: Secondary | ICD-10-CM | POA: Diagnosis present

## 2022-08-25 DIAGNOSIS — Z720 Tobacco use: Secondary | ICD-10-CM | POA: Diagnosis present

## 2022-08-25 DIAGNOSIS — Z794 Long term (current) use of insulin: Secondary | ICD-10-CM

## 2022-08-25 DIAGNOSIS — Z886 Allergy status to analgesic agent status: Secondary | ICD-10-CM

## 2022-08-25 DIAGNOSIS — I10 Essential (primary) hypertension: Secondary | ICD-10-CM | POA: Diagnosis present

## 2022-08-25 DIAGNOSIS — N400 Enlarged prostate without lower urinary tract symptoms: Secondary | ICD-10-CM | POA: Diagnosis present

## 2022-08-25 DIAGNOSIS — I129 Hypertensive chronic kidney disease with stage 1 through stage 4 chronic kidney disease, or unspecified chronic kidney disease: Secondary | ICD-10-CM | POA: Diagnosis present

## 2022-08-25 DIAGNOSIS — E039 Hypothyroidism, unspecified: Secondary | ICD-10-CM | POA: Diagnosis present

## 2022-08-25 DIAGNOSIS — K3184 Gastroparesis: Secondary | ICD-10-CM | POA: Diagnosis present

## 2022-08-25 DIAGNOSIS — E1122 Type 2 diabetes mellitus with diabetic chronic kidney disease: Secondary | ICD-10-CM | POA: Diagnosis present

## 2022-08-25 DIAGNOSIS — M48061 Spinal stenosis, lumbar region without neurogenic claudication: Secondary | ICD-10-CM | POA: Diagnosis present

## 2022-08-25 DIAGNOSIS — Z7902 Long term (current) use of antithrombotics/antiplatelets: Secondary | ICD-10-CM

## 2022-08-25 DIAGNOSIS — I959 Hypotension, unspecified: Secondary | ICD-10-CM

## 2022-08-25 DIAGNOSIS — F17210 Nicotine dependence, cigarettes, uncomplicated: Secondary | ICD-10-CM | POA: Diagnosis present

## 2022-08-25 DIAGNOSIS — T43595A Adverse effect of other antipsychotics and neuroleptics, initial encounter: Secondary | ICD-10-CM | POA: Diagnosis present

## 2022-08-25 LAB — TROPONIN I (HIGH SENSITIVITY): Troponin I (High Sensitivity): 6 ng/L (ref <18)

## 2022-08-25 LAB — COMPREHENSIVE METABOLIC PANEL
ALT: 13 U/L (ref 0–44)
AST: 12 U/L — ABNORMAL LOW (ref 15–41)
Albumin: 3.2 g/dL — ABNORMAL LOW (ref 3.5–5.0)
Alkaline Phosphatase: 83 U/L (ref 38–126)
Anion gap: 10 (ref 5–15)
BUN: 34 mg/dL — ABNORMAL HIGH (ref 6–20)
CO2: 23 mmol/L (ref 22–32)
Calcium: 9.1 mg/dL (ref 8.9–10.3)
Chloride: 97 mmol/L — ABNORMAL LOW (ref 98–111)
Creatinine, Ser: 1.79 mg/dL — ABNORMAL HIGH (ref 0.61–1.24)
GFR, Estimated: 44 mL/min — ABNORMAL LOW (ref >60)
Glucose, Bld: 256 mg/dL — ABNORMAL HIGH (ref 70–99)
Potassium: 4 mmol/L (ref 3.5–5.1)
Sodium: 130 mmol/L — ABNORMAL LOW (ref 135–145)
Total Bilirubin: 0.4 mg/dL (ref 0.3–1.2)
Total Protein: 6.4 g/dL — ABNORMAL LOW (ref 6.5–8.1)

## 2022-08-25 LAB — CBC WITH DIFFERENTIAL/PLATELET
Abs Immature Granulocytes: 0.13 10*3/uL — ABNORMAL HIGH (ref 0.00–0.07)
Basophils Absolute: 0.2 10*3/uL — ABNORMAL HIGH (ref 0.0–0.1)
Basophils Relative: 1 %
Eosinophils Absolute: 0.2 10*3/uL (ref 0.0–0.5)
Eosinophils Relative: 2 %
HCT: 36.5 % — ABNORMAL LOW (ref 39.0–52.0)
Hemoglobin: 12.6 g/dL — ABNORMAL LOW (ref 13.0–17.0)
Immature Granulocytes: 1 %
Lymphocytes Relative: 30 %
Lymphs Abs: 3.5 10*3/uL (ref 0.7–4.0)
MCH: 26.9 pg (ref 26.0–34.0)
MCHC: 34.5 g/dL (ref 30.0–36.0)
MCV: 78 fL — ABNORMAL LOW (ref 80.0–100.0)
Monocytes Absolute: 0.8 10*3/uL (ref 0.1–1.0)
Monocytes Relative: 7 %
Neutro Abs: 6.7 10*3/uL (ref 1.7–7.7)
Neutrophils Relative %: 59 %
Platelets: 466 10*3/uL — ABNORMAL HIGH (ref 150–400)
RBC: 4.68 MIL/uL (ref 4.22–5.81)
RDW: 13 % (ref 11.5–15.5)
WBC: 11.5 10*3/uL — ABNORMAL HIGH (ref 4.0–10.5)
nRBC: 0 % (ref 0.0–0.2)

## 2022-08-25 LAB — I-STAT CHEM 8, ED
BUN: 33 mg/dL — ABNORMAL HIGH (ref 6–20)
Calcium, Ion: 1.16 mmol/L (ref 1.15–1.40)
Chloride: 99 mmol/L (ref 98–111)
Creatinine, Ser: 1.8 mg/dL — ABNORMAL HIGH (ref 0.61–1.24)
Glucose, Bld: 253 mg/dL — ABNORMAL HIGH (ref 70–99)
HCT: 37 % — ABNORMAL LOW (ref 39.0–52.0)
Hemoglobin: 12.6 g/dL — ABNORMAL LOW (ref 13.0–17.0)
Potassium: 4.2 mmol/L (ref 3.5–5.1)
Sodium: 131 mmol/L — ABNORMAL LOW (ref 135–145)
TCO2: 23 mmol/L (ref 22–32)

## 2022-08-25 LAB — I-STAT VENOUS BLOOD GAS, ED
Acid-base deficit: 1 mmol/L (ref 0.0–2.0)
Bicarbonate: 24.1 mmol/L (ref 20.0–28.0)
Calcium, Ion: 1.14 mmol/L — ABNORMAL LOW (ref 1.15–1.40)
HCT: 36 % — ABNORMAL LOW (ref 39.0–52.0)
Hemoglobin: 12.2 g/dL — ABNORMAL LOW (ref 13.0–17.0)
O2 Saturation: 43 %
Potassium: 4.2 mmol/L (ref 3.5–5.1)
Sodium: 132 mmol/L — ABNORMAL LOW (ref 135–145)
TCO2: 25 mmol/L (ref 22–32)
pCO2, Ven: 40.2 mmHg — ABNORMAL LOW (ref 44–60)
pH, Ven: 7.385 (ref 7.25–7.43)
pO2, Ven: 25 mmHg — CL (ref 32–45)

## 2022-08-25 LAB — LIPASE, BLOOD: Lipase: 54 U/L — ABNORMAL HIGH (ref 11–51)

## 2022-08-25 LAB — APTT: aPTT: 30 seconds (ref 24–36)

## 2022-08-25 LAB — PROTIME-INR
INR: 1 (ref 0.8–1.2)
Prothrombin Time: 12.8 seconds (ref 11.4–15.2)

## 2022-08-25 LAB — LACTIC ACID, PLASMA: Lactic Acid, Venous: 2.2 mmol/L (ref 0.5–1.9)

## 2022-08-25 MED ORDER — LACTATED RINGERS IV BOLUS (SEPSIS)
1000.0000 mL | Freq: Once | INTRAVENOUS | Status: AC
  Administered 2022-08-25: 1000 mL via INTRAVENOUS

## 2022-08-25 NOTE — ED Provider Notes (Signed)
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  Patient brought in by EMS from home.  Sounds like for the past few days has been more lethargic and having difficulty ambulating.  Reportedly had about a 10-minute episode of chest pain this evening.  EMS found with blood pressure in the 70s, improved with some IV fluids.  Patient states he has been eating and drinking well.  He denies any chest pain abdominal pain vomiting diarrhea.  He does endorse a little bit of burning with urination.  The history is provided by the patient and the EMS personnel.  Weakness Severity:  Severe Onset quality:  Gradual Duration:  3 days Timing:  Constant Progression:  Worsening Context: not alcohol use   Relieved by:  Nothing Worsened by:  Activity Ineffective treatments:  Drinking fluids Associated symptoms: chest pain, dysuria and lethargy   Associated symptoms: no abdominal pain, no cough, no diarrhea, no fever, no nausea and no vomiting   Risk factors: diabetes        Home Medications Prior to Admission medications   Medication Sig Start Date End Date Taking? Authorizing Provider  acetaminophen (TYLENOL) 500 MG tablet Take 1,000 mg by mouth every 4 (four) hours as needed for moderate pain or headache.    [provider]  clopidogrel (PLAVIX) 75 MG tablet Take 1 tablet (75 mg total) by mouth daily. 10/12/21   Glean Salvo, NP  dicyclomine (BENTYL) 20 MG tablet Take 1 tablet (20 mg total) by mouth 2 (two) times daily as needed (abdominal cramping). 06/21/22   Mardene Sayer, MD  DULoxetine (CYMBALTA) 60 MG capsule Take 1 capsule (60 mg total) by mouth daily. 10/12/21   Glean Salvo, NP  famotidine (PEPCID) 20 MG tablet  Take 1 tablet (20 mg total) by mouth 2 (two) times daily. 06/21/22   Mardene Sayer, MD  fenofibrate 160 MG tablet Take 160 mg by mouth at bedtime. 12/31/19   [provider]  HUMALOG KWIKPEN 200 UNIT/ML KwikPen Inject 25 Units into the skin in the morning, at noon, and at bedtime. 06/17/21   [provider]  hydroxychloroquine (PLAQUENIL) 200 MG tablet Take 200 mg by mouth daily. 04/27/22   [provider]  ibuprofen (ADVIL) 200 MG tablet Take 600 mg by mouth 2 (two) times daily. PRN    [provider]  insulin glargine, 2 Unit Dial, (TOUJEO MAX SOLOSTAR) 300 UNIT/ML Solostar Pen Inject 100 Units into the skin daily. Patient taking differently: Inject 80 Units into the skin daily. 05/27/22   Calvert Cantor, MD  lamoTRIgine (LAMICTAL) 150 MG tablet Take 1 tablet (150 mg total) by mouth 2 (two) times daily. 10/12/21   Glean Salvo, NP  lurasidone (LATUDA) 20 MG TABS tablet Take 40 mg by mouth at bedtime.    [provider]  omeprazole (PRILOSEC) 40 MG capsule Take 40 mg by mouth daily. 08/23/21   [provider]  oxyCODONE (ROXICODONE) 5 MG immediate release tablet Take 1 tablet (5 mg total) by mouth every 4 (four) hours as needed for severe pain. 08/11/22   Rondel Baton, MD  pregabalin (LYRICA) 200 MG capsule Take 200 mg by mouth 3 (three)  times daily. 12/25/21   Dois Davenport, MD  rosuvastatin (CRESTOR) 10 MG tablet Take 1 tablet (10 mg total) by mouth daily. Patient taking differently: Take 10 mg by mouth at bedtime. 08/04/21 07/18/22  Lauro Franklin, MD  tamsulosin (FLOMAX) 0.4 MG CAPS capsule Take 0.4 mg by mouth at bedtime.    [provider]  traZODone (DESYREL) 150 MG tablet Take 1 tablet (150 mg total) by mouth at bedtime as needed for sleep. 01/25/22   Arfeen, Phillips Grout, MD  venlafaxine XR (EFFEXOR-XR) 150 MG 24 hr capsule Take 1 capsule (150 mg total) by mouth daily with breakfast. 01/25/22   Arfeen, Phillips Grout, MD  Vitamin  D, Ergocalciferol, (DRISDOL) 1.25 MG (50000 UNIT) CAPS capsule Take 50,000 Units by mouth every Wednesday. 12/26/19   [provider]      Allergies    Celecoxib, Hydrocodone, Sulfa antibiotics, and Sulfacetamide sodium    Review of Systems   Review of Systems  Constitutional:  Negative for fever.  Respiratory:  Negative for cough.   Cardiovascular:  Positive for chest pain.  Gastrointestinal:  Negative for abdominal pain, diarrhea, nausea and vomiting.  Genitourinary:  Positive for dysuria.  Neurological:  Positive for weakness.    Physical Exam Updated Vital Signs BP 104/65   Pulse 85   Temp 98.3 F (36.8 C) (Oral)   SpO2 97%  Physical Exam Vitals and nursing note reviewed.  Constitutional:      General: He is not in acute distress.    Appearance: He is well-developed. He is ill-appearing.  HENT:     Head: Normocephalic and atraumatic.  Eyes:     Conjunctiva/sclera: Conjunctivae normal.  Cardiovascular:     Rate and Rhythm: Normal rate and regular rhythm.     Heart sounds: Normal heart sounds. No murmur heard. Pulmonary:     Effort: Pulmonary effort is normal. No respiratory distress.     Breath sounds: Normal breath sounds.  Abdominal:     Palpations: Abdomen is soft.     Tenderness: There is no abdominal tenderness. There is no guarding or rebound.  Musculoskeletal:        General: No swelling.     Cervical back: Neck supple.     Right lower leg: No tenderness. No edema.     Left lower leg: No tenderness. No edema.  Skin:    General: Skin is warm and dry.     Capillary Refill: Capillary refill takes less than 2 seconds.  Neurological:     General: No focal deficit present.     Mental Status: He is alert.     ED Results / Procedures / Treatments   Labs (all labs ordered are listed, but only abnormal results are displayed) Labs Reviewed  CULTURE, BLOOD (ROUTINE X 2)  CULTURE, BLOOD (ROUTINE X 2)  LACTIC ACID, PLASMA  LACTIC ACID, PLASMA   COMPREHENSIVE METABOLIC PANEL  CBC WITH DIFFERENTIAL/PLATELET  PROTIME-INR  APTT  LIPASE, BLOOD  URINALYSIS, W/ REFLEX TO CULTURE (INFECTION SUSPECTED)  I-STAT VENOUS BLOOD GAS, ED  I-STAT CHEM 8, ED  TROPONIN I (HIGH SENSITIVITY)    EKG EKG Interpretation  Date/Time:  Friday August 25 2022 21:27:19 EDT Ventricular Rate:  85 PR Interval:  142 QRS Duration: 94 QT Interval:  391 QTC Calculation: 465 R Axis:   63 Text Interpretation: Sinus rhythm Minimal ST elevation, inferior leads No significant change since prior 5/24 Confirmed by Meridee Score [id number]) on 08/25/2022 9:28:50 PM  Radiology No results found.  Procedures Procedures  {Document cardiac monitor, telemetry assessment procedure when appropriate:1}  Medications Ordered in ED Medications  lactated ringers bolus 1,000 mL (has no administration in time range)    ED Course/ Medical Decision Making/ A&P   {   Click here for ABCD2, HEART and other calculatorsREFRESH Note before signing :1}                          Medical Decision Making Amount and/or Complexity of Data Reviewed Labs: ordered. Radiology: ordered.   This patient complains of ***; this involves an extensive number of treatment Options and is a complaint that carries with it a high risk of complications and morbidity. The differential includes ***  I ordered, reviewed and interpreted labs, which included *** I ordered medication *** and reviewed PMP when indicated. I ordered imaging studies which included *** and I independently    visualized and interpreted imaging which showed *** Additional history obtained from *** Previous records obtained and reviewed *** I consulted *** and discussed lab and imaging findings and discussed disposition.  Cardiac monitoring reviewed, *** Social determinants considered, *** Critical Interventions: ***  After the interventions stated above, I reevaluated the patient and found *** Admission and further  testing considered, ***   {Document critical care time when appropriate:1} {Document review of labs and clinical decision tools ie heart score, Chads2Vasc2 etc:1}  {Document your independent review of radiology images, and any outside records:1} {Document your discussion with family members, caretakers, and with consultants:1} {Document social determinants of health affecting pt's care:1} {Document your decision making why or why not admission, treatments were needed:1} Final Clinical Impression(s) / ED Diagnoses Final diagnoses:  None    Rx / DC Orders ED Discharge Orders     None

## 2022-08-25 NOTE — ED Notes (Signed)
Pt's sister stepping outside, she will be back for discussions with MD

## 2022-08-25 NOTE — ED Notes (Signed)
Updated family of patient family member on plan of care.

## 2022-08-25 NOTE — ED Triage Notes (Signed)
Pt BIBGEMS from home progressively getting weaker and more lethargic for 2-3 days per family. Confused and unable to stand as of today. Had an episode of cp with sensations of "something sitting on chest tat resolved while watching TV."  Hx DM   60/40 to 119/64 90 HR Cap 28 Room air 100%  600 NS   20 g left wrist 20 r hand  290 CBG

## 2022-08-26 ENCOUNTER — Observation Stay (HOSPITAL_COMMUNITY): Payer: Medicare HMO

## 2022-08-26 DIAGNOSIS — N179 Acute kidney failure, unspecified: Secondary | ICD-10-CM

## 2022-08-26 DIAGNOSIS — G9340 Encephalopathy, unspecified: Secondary | ICD-10-CM | POA: Diagnosis not present

## 2022-08-26 DIAGNOSIS — R7989 Other specified abnormal findings of blood chemistry: Secondary | ICD-10-CM | POA: Diagnosis not present

## 2022-08-26 DIAGNOSIS — I959 Hypotension, unspecified: Secondary | ICD-10-CM | POA: Diagnosis not present

## 2022-08-26 DIAGNOSIS — D494 Neoplasm of unspecified behavior of bladder: Secondary | ICD-10-CM

## 2022-08-26 LAB — CBC WITH DIFFERENTIAL/PLATELET
Abs Immature Granulocytes: 0.09 10*3/uL — ABNORMAL HIGH (ref 0.00–0.07)
Basophils Absolute: 0.1 10*3/uL (ref 0.0–0.1)
Basophils Relative: 1 %
Eosinophils Absolute: 0.3 10*3/uL (ref 0.0–0.5)
Eosinophils Relative: 3 %
HCT: 34.4 % — ABNORMAL LOW (ref 39.0–52.0)
Hemoglobin: 11.9 g/dL — ABNORMAL LOW (ref 13.0–17.0)
Immature Granulocytes: 1 %
Lymphocytes Relative: 38 %
Lymphs Abs: 3 10*3/uL (ref 0.7–4.0)
MCH: 26.5 pg (ref 26.0–34.0)
MCHC: 34.6 g/dL (ref 30.0–36.0)
MCV: 76.6 fL — ABNORMAL LOW (ref 80.0–100.0)
Monocytes Absolute: 0.5 10*3/uL (ref 0.1–1.0)
Monocytes Relative: 7 %
Neutro Abs: 3.9 10*3/uL (ref 1.7–7.7)
Neutrophils Relative %: 50 %
Platelets: 373 10*3/uL (ref 150–400)
RBC: 4.49 MIL/uL (ref 4.22–5.81)
RDW: 12.9 % (ref 11.5–15.5)
WBC: 7.8 10*3/uL (ref 4.0–10.5)
nRBC: 0 % (ref 0.0–0.2)

## 2022-08-26 LAB — BASIC METABOLIC PANEL
Anion gap: 8 (ref 5–15)
BUN: 26 mg/dL — ABNORMAL HIGH (ref 6–20)
CO2: 24 mmol/L (ref 22–32)
Calcium: 8.6 mg/dL — ABNORMAL LOW (ref 8.9–10.3)
Chloride: 100 mmol/L (ref 98–111)
Creatinine, Ser: 1.13 mg/dL (ref 0.61–1.24)
GFR, Estimated: >60 mL/min (ref >60)
Glucose, Bld: 219 mg/dL — ABNORMAL HIGH (ref 70–99)
Potassium: 4 mmol/L (ref 3.5–5.1)
Sodium: 132 mmol/L — ABNORMAL LOW (ref 135–145)

## 2022-08-26 LAB — URINALYSIS, W/ REFLEX TO CULTURE (INFECTION SUSPECTED)
Bacteria, UA: NONE SEEN
Bilirubin Urine: NEGATIVE
Glucose, UA: >=500 mg/dL — AB
Hgb urine dipstick: NEGATIVE
Ketones, ur: NEGATIVE mg/dL
Leukocytes,Ua: NEGATIVE
Nitrite: NEGATIVE
Protein, ur: 100 mg/dL — AB
Specific Gravity, Urine: 1.021 (ref 1.005–1.030)
pH: 5 (ref 5.0–8.0)

## 2022-08-26 LAB — RAPID URINE DRUG SCREEN, HOSP PERFORMED
Amphetamines: NOT DETECTED
Barbiturates: NOT DETECTED
Benzodiazepines: NOT DETECTED
Cocaine: NOT DETECTED
Opiates: NOT DETECTED
Tetrahydrocannabinol: NOT DETECTED

## 2022-08-26 LAB — MAGNESIUM: Magnesium: 2.1 mg/dL (ref 1.7–2.4)

## 2022-08-26 LAB — GLUCOSE, CAPILLARY
Glucose-Capillary: 231 mg/dL — ABNORMAL HIGH (ref 70–99)
Glucose-Capillary: 218 mg/dL — ABNORMAL HIGH (ref 70–99)
Glucose-Capillary: 174 mg/dL — ABNORMAL HIGH (ref 70–99)

## 2022-08-26 LAB — HEMOGLOBIN A1C
Hgb A1c MFr Bld: 14.1 % — ABNORMAL HIGH (ref 4.8–5.6)
Mean Plasma Glucose: 357.97 mg/dL

## 2022-08-26 LAB — CBG MONITORING, ED: Glucose-Capillary: 307 mg/dL — ABNORMAL HIGH (ref 70–99)

## 2022-08-26 LAB — VITAMIN B12: Vitamin B-12: 618 pg/mL (ref 180–914)

## 2022-08-26 LAB — HIV ANTIBODY (ROUTINE TESTING W REFLEX): HIV Screen 4th Generation wRfx: NONREACTIVE

## 2022-08-26 LAB — CULTURE, BLOOD (ROUTINE X 2): Special Requests: ADEQUATE

## 2022-08-26 LAB — TROPONIN I (HIGH SENSITIVITY): Troponin I (High Sensitivity): 5 ng/L (ref <18)

## 2022-08-26 LAB — LACTIC ACID, PLASMA: Lactic Acid, Venous: 2 mmol/L (ref 0.5–1.9)

## 2022-08-26 LAB — RPR: RPR Ser Ql: NONREACTIVE

## 2022-08-26 LAB — FOLATE: Folate: 10.2 ng/mL (ref >5.9)

## 2022-08-26 MED ORDER — TAMSULOSIN HCL 0.4 MG PO CAPS
0.4000 mg | ORAL_CAPSULE | Freq: Every day | ORAL | Status: DC
  Administered 2022-08-26: 0.4 mg via ORAL
  Filled 2022-08-26: qty 1

## 2022-08-26 MED ORDER — LAMOTRIGINE 25 MG PO TABS
150.0000 mg | ORAL_TABLET | Freq: Two times a day (BID) | ORAL | Status: DC
  Administered 2022-08-26 – 2022-08-29 (×6): 150 mg via ORAL
  Filled 2022-08-26 (×6): qty 6

## 2022-08-26 MED ORDER — INSULIN GLARGINE-YFGN 100 UNIT/ML ~~LOC~~ SOLN
80.0000 [IU] | Freq: Every day | SUBCUTANEOUS | Status: DC
  Administered 2022-08-26 – 2022-08-29 (×4): 80 [IU] via SUBCUTANEOUS
  Filled 2022-08-26 (×5): qty 0.8

## 2022-08-26 MED ORDER — ONDANSETRON HCL 4 MG/2ML IJ SOLN: 4.0000 mg | Freq: Four times a day (QID) | INTRAMUSCULAR | Status: DC | PRN

## 2022-08-26 MED ORDER — HYDROXYCHLOROQUINE SULFATE 200 MG PO TABS
200.0000 mg | ORAL_TABLET | Freq: Two times a day (BID) | ORAL | Status: DC
  Administered 2022-08-26 – 2022-08-29 (×6): 200 mg via ORAL
  Filled 2022-08-26 (×7): qty 1

## 2022-08-26 MED ORDER — PREGABALIN 100 MG PO CAPS
600.0000 mg | ORAL_CAPSULE | Freq: Once | ORAL | Status: AC
  Administered 2022-08-26: 600 mg via ORAL
  Filled 2022-08-26: qty 6

## 2022-08-26 MED ORDER — SODIUM CHLORIDE 0.9 % IV SOLN: INTRAVENOUS | Status: DC

## 2022-08-26 MED ORDER — ACETAMINOPHEN 650 MG RE SUPP: 650.0000 mg | Freq: Four times a day (QID) | RECTAL | Status: DC | PRN

## 2022-08-26 MED ORDER — AMLODIPINE BESYLATE 5 MG PO TABS
5.0000 mg | ORAL_TABLET | Freq: Once | ORAL | Status: AC
  Administered 2022-08-26: 5 mg via ORAL
  Filled 2022-08-26: qty 1

## 2022-08-26 MED ORDER — DULOXETINE HCL 60 MG PO CPEP
60.0000 mg | ORAL_CAPSULE | Freq: Every day | ORAL | Status: DC
  Administered 2022-08-26 – 2022-08-29 (×4): 60 mg via ORAL
  Filled 2022-08-26 (×4): qty 1

## 2022-08-26 MED ORDER — NICOTINE 21 MG/24HR TD PT24
21.0000 mg | MEDICATED_PATCH | Freq: Every day | TRANSDERMAL | Status: DC
  Administered 2022-08-26 – 2022-08-29 (×4): 21 mg via TRANSDERMAL
  Filled 2022-08-26 (×4): qty 1

## 2022-08-26 MED ORDER — FAMOTIDINE 20 MG PO TABS
20.0000 mg | ORAL_TABLET | Freq: Two times a day (BID) | ORAL | Status: DC
  Administered 2022-08-26 – 2022-08-29 (×7): 20 mg via ORAL
  Filled 2022-08-26 (×7): qty 1

## 2022-08-26 MED ORDER — ACETAMINOPHEN 325 MG PO TABS
650.0000 mg | ORAL_TABLET | Freq: Four times a day (QID) | ORAL | Status: DC | PRN
  Administered 2022-08-29: 650 mg via ORAL
  Filled 2022-08-26: qty 2

## 2022-08-26 MED ORDER — ENOXAPARIN SODIUM 40 MG/0.4ML IJ SOSY
40.0000 mg | PREFILLED_SYRINGE | INTRAMUSCULAR | Status: DC
  Administered 2022-08-26: 40 mg via SUBCUTANEOUS
  Filled 2022-08-26: qty 0.4

## 2022-08-26 MED ORDER — INSULIN ASPART 100 UNIT/ML IJ SOLN
0.0000 [IU] | INTRAMUSCULAR | Status: DC
  Administered 2022-08-26: 11 [IU] via SUBCUTANEOUS

## 2022-08-26 MED ORDER — ONDANSETRON HCL 4 MG PO TABS: 4.0000 mg | ORAL_TABLET | Freq: Four times a day (QID) | ORAL | Status: DC | PRN

## 2022-08-26 MED ORDER — INSULIN ASPART 100 UNIT/ML IJ SOLN
0.0000 [IU] | Freq: Three times a day (TID) | INTRAMUSCULAR | Status: DC
  Administered 2022-08-26: 5 [IU] via SUBCUTANEOUS
  Administered 2022-08-26: 3 [IU] via SUBCUTANEOUS
  Administered 2022-08-26 – 2022-08-27 (×2): 5 [IU] via SUBCUTANEOUS
  Administered 2022-08-27: 3 [IU] via SUBCUTANEOUS
  Administered 2022-08-27: 8 [IU] via SUBCUTANEOUS
  Administered 2022-08-28 (×2): 3 [IU] via SUBCUTANEOUS
  Administered 2022-08-28: 2 [IU] via SUBCUTANEOUS
  Administered 2022-08-29: 8 [IU] via SUBCUTANEOUS

## 2022-08-26 MED ORDER — LURASIDONE HCL 20 MG PO TABS
40.0000 mg | ORAL_TABLET | Freq: Every day | ORAL | Status: DC
  Administered 2022-08-26 – 2022-08-28 (×3): 40 mg via ORAL
  Filled 2022-08-26 (×4): qty 2

## 2022-08-26 MED ORDER — CLOPIDOGREL BISULFATE 75 MG PO TABS
75.0000 mg | ORAL_TABLET | Freq: Every day | ORAL | Status: DC
  Administered 2022-08-26 – 2022-08-29 (×4): 75 mg via ORAL
  Filled 2022-08-26 (×4): qty 1

## 2022-08-26 MED ORDER — SENNOSIDES-DOCUSATE SODIUM 8.6-50 MG PO TABS: 1.0000 | ORAL_TABLET | Freq: Every evening | ORAL | Status: DC | PRN

## 2022-08-26 MED ORDER — PREGABALIN 100 MG PO CAPS
200.0000 mg | ORAL_CAPSULE | Freq: Three times a day (TID) | ORAL | Status: DC
  Administered 2022-08-26 – 2022-08-29 (×10): 200 mg via ORAL
  Filled 2022-08-26 (×10): qty 2

## 2022-08-26 NOTE — Evaluation (Signed)
Physical Therapy Evaluation  Patient Details Name: Vincent [name] MRN: [id number] DOB: Apr 10, 1965 Today's Date: 08/26/2022  History of Present Illness  57 year old male who presents with confusion, weakness, and hypotension. AKI present on admission. PMHx: uncontrolled diabetes with A1c > 15, peripheral neuropathy, anxiety disorder, chronic pain syndrome, bipolar 2 disorder, PTSD, DM type 2,  RA, CAD, COPD, GERD, h/o bladder cancer and gastroparesis.   Clinical Impression  Pt admitted with above diagnosis. Pt currently with functional limitations due to the deficits listed below (see PT Problem List). At the time of PT eval pt was able to perform transfers with up to +2 mod assist but was significantly limited by orthostatic hypotension. See below for details. Recommend TED stockings and abdominal binder next session to assess BP during mobility. Pt states he wears compression stockings for "days at a time" at home due to orthostatic hypotension. Pt will benefit from acute skilled PT to increase their independence and safety with mobility to allow discharge.       Orthostatic BPs  Supine 121/75   Sitting 93/59   Sitting after 6 min 116/63   Standing 68/41        Recommendations for follow up therapy are one component of a multi-disciplinary discharge planning process, led by the attending physician.  Recommendations may be updated based on patient status, additional functional criteria and insurance authorization.  Follow Up Recommendations Can patient physically be transported by private vehicle: No     Assistance Recommended at Discharge Intermittent Supervision/Assistance  Patient can return home with the following  A little help with walking and/or transfers;A little help with bathing/dressing/bathroom;Assist for transportation;Help with stairs or ramp for entrance;Assistance with cooking/housework    Equipment Recommendations None recommended by PT  Recommendations for Other  Services       Functional Status Assessment Patient has had a recent decline in their functional status and demonstrates the ability to make significant improvements in function in a reasonable and predictable amount of time.     Precautions / Restrictions Precautions Precautions: Fall Precaution Comments: Orthostatic - watch BP Restrictions Weight Bearing Restrictions: No      Mobility  Bed Mobility Overal bed mobility: Needs Assistance Bed Mobility: Supine to Sit, Sit to Supine     Supine to sit: Supervision Sit to supine: Supervision   General bed mobility comments: Increased time and supervision for safety to transition to EOB.    Transfers Overall transfer level: Needs assistance Equipment used: Rolling walker (2 wheels) Transfers: Sit to/from Stand Sit to Stand: Mod assist, +2 physical assistance           General transfer comment: VC's for hand placement on seated surface for safety. Pt was able to power up to full stand with +2 assist.    Ambulation/Gait               General Gait Details: Unable to progress to gait training at this time.  Stairs            Wheelchair Mobility    Modified Rankin (Stroke Patients Only)       Balance Overall balance assessment: Needs assistance Sitting-balance support: Feet supported, Single extremity supported Sitting balance-Leahy Scale: Fair     Standing balance support: Bilateral upper extremity supported, During functional activity, Reliant on assistive device for balance Standing balance-Leahy Scale: Poor  Pertinent Vitals/Pain Pain Assessment Pain Assessment: No/denies pain    Home Living Family/patient expects to be discharged to:: Private residence Living Arrangements: Other relatives (sister) Available Help at Discharge: Family Type of Home: Mobile home Home Access: Stairs to enter Entrance Stairs-Rails: Doctor, general practice of  Steps: 5   Home Layout: One level Home Equipment: Rollator (4 wheels);Cane - single point Additional Comments: Sister lives with pt but works full-time    Prior Function Prior Level of Function : History of Falls (last six months);Independent/Modified Independent             Mobility Comments: mod I with use of rollator at all times, reports significant fall history ADLs Comments: mod I with dressing and toileting. Sister assists with showering and IADLs     Hand Dominance   Dominant Hand: Left    Extremity/Trunk Assessment   Upper Extremity Assessment Upper Extremity Assessment: Defer to OT evaluation    Lower Extremity Assessment Lower Extremity Assessment: Generalized weakness    Cervical / Trunk Assessment Cervical / Trunk Assessment: Kyphotic  Communication   Communication: No difficulties  Cognition Arousal/Alertness: Awake/alert Behavior During Therapy: WFL for tasks assessed/performed Overall Cognitive Status: No family/caregiver present to determine baseline cognitive functioning                                          General Comments General comments (skin integrity, edema, etc.): BP in supine: 151/82, BP sitting EOB: 126/75, BP sitting EOB for 5 minutes: 133/85, BP standing: 92/58, BP return to supine: 142/75, pt symptomatic and RN present/updated    Exercises General Exercises - Lower Extremity Ankle Circles/Pumps: 10 reps Long Arc Quad: 10 reps, Seated, Strengthening, Both Heel Slides: 10 reps, Seated, Strengthening, Both   Assessment/Plan    PT Assessment Patient needs continued PT services  PT Problem List Decreased strength;Decreased activity tolerance;Decreased balance;Decreased mobility;Decreased safety awareness;Decreased knowledge of use of DME;Decreased knowledge of precautions;Cardiopulmonary status limiting activity       PT Treatment Interventions DME instruction;Gait training;Stair training;Functional mobility  training;Therapeutic activities;Therapeutic exercise;Balance training;Patient/family education    PT Goals (Current goals can be found in the Care Plan section)  Acute Rehab PT Goals Patient Stated Goal: Get BP under control PT Goal Formulation: With patient/family Time For Goal Achievement: 09/09/22 Potential to Achieve Goals: Good    Frequency Min 3X/week     Co-evaluation               AM-PAC PT "6 Clicks" Mobility  Outcome Measure Help needed turning from your back to your side while in a flat bed without using bedrails?: None Help needed moving from lying on your back to sitting on the side of a flat bed without using bedrails?: A Little Help needed moving to and from a bed to a chair (including a wheelchair)?: A Little Help needed standing up from a chair using your arms (e.g., wheelchair or bedside chair)?: A Little Help needed to walk in hospital room?: Total Help needed climbing 3-5 steps with a railing? : Total 6 Click Score: 15    End of Session Equipment Utilized During Treatment: Gait belt Activity Tolerance: Treatment limited secondary to medical complications (Comment) (Orthostatic hypotension) Patient left: in bed;with call bell/phone within reach;with family/visitor present;with bed alarm set Nurse Communication: Mobility status PT Visit Diagnosis: Unsteadiness on feet (R26.81);Difficulty in walking, not elsewhere classified (R26.2)    Time: 4098-1191  PT Time Calculation (min) (ACUTE ONLY): 37 min   Charges:   PT Evaluation $PT Eval Moderate Complexity: 1 Mod PT Treatments $Therapeutic Activity: 8-22 mins        Vincent [name], PT, DPT Acute Rehabilitation Services Secure Chat Preferred Office: [phone]   Vincent [name] 08/26/2022, 3:37 PM

## 2022-08-26 NOTE — ED Notes (Signed)
Report called to [facility] RN pt transferred via gurney to 5c bed 6

## 2022-08-26 NOTE — ED Notes (Signed)
ED TO INPATIENT HANDOFF REPORT  ED Nurse Name and Phone #: [phone] [name]  S Name/Age/Gender Vincent [name] 57 y.o. male Room/Bed: 033C/033C  Code Status   Code Status: Prior  Home/SNF/Other Home Patient oriented to: self, place, time, and situation Is this baseline? Yes   Triage Complete: Triage complete  Chief Complaint AKI (acute kidney injury) (HCC) [N17.9]  Triage Note Pt BIBGEMS from home progressively getting weaker and more lethargic for 2-3 days per family. Confused and unable to stand as of today. Had an episode of cp with sensations of "something sitting on chest tat resolved while watching TV."  Hx DM   60/40 to 119/64 90 HR Cap 28 Room air 100%  600 NS   20 g left wrist 20 r hand  290 CBG     Allergies Allergies  Allergen Reactions   Celecoxib Anaphylaxis, Swelling and Rash    Tongue swells   Hydrocodone Rash and Other (See Comments)    "blisters developed on arms"    Sulfa Antibiotics Rash   Sulfacetamide Sodium Rash    Level of Care/Admitting Diagnosis ED Disposition     ED Disposition  Admit   Condition  --   Comment  Hospital [facility] [[id number]]  Level of Care: Med-Surg [16]  May place patient in observation at [facility] if equivalent level of care is available:: Yes  Covid Evaluation: Confirmed COVID Negative  Diagnosis: AKI (acute kidney injury) [facility]) [[id number]]  Admitting Physician: Gery Pray [[id number]]  Attending Physician: Alvester Chou          B Medical/Surgery History Past Medical History:  Diagnosis Date   Anxiety    Benign essential tremor    Bipolar 2 disorder (HCC)    followed by [facility]--- dr s. Lolly Mustache   Bladder cancer [facility])    recurrent   CAD (coronary artery disease)    cardiac cath 2003  and 2011 both showed normal coronary arteries w/ preserved lvf;  Non obstructive on CTA Oct 2019.    Chronic pain syndrome    back---- [facility]  [place]   Cold extremities    BLE   COPD (chronic obstructive pulmonary disease) (HCC)    DDD (degenerative disc disease), lumbar    Diabetic peripheral neuropathy (HCC)    Gastroparesis    followed by dr Marina Goodell   GERD (gastroesophageal reflux disease)    Hiatal hernia    History of bladder cancer urologist-  previously dr Ronal Fear;  now dr gay   papillay TCC (Ta G1)  s/p TURBT and chemo instillation 2014   History of chest pain 12/2017   heart cath normal   History of encephalopathy 05/27/2015   admission w/ acute encephalopathy thought to be secondary to pain meds and COPD   History of gastric ulcer    History of Helicobacter pylori infection    History of kidney stones    History of TIA (transient ischemic attack) 2008  and 10-19-2018    no residual's   History of traumatic head injury 2010   w/ LOC  per pt needed stitches, hit in head with a mower blade   Hyperlipidemia    Hypertension    Hypogonadism male    s/p  bilateral orchiectomy   Hypothyroidism    Insomnia    Mild obstructive sleep apnea    study in epic 12-04-2016, no cpap   PTSD (post-traumatic stress disorder)    chronic  PTSD (post-traumatic stress disorder)    RA (rheumatoid arthritis) (HCC)    followed by [facility]   Seizures, transient [facility]) neurologist-  dr Terrace Arabia--  differential dx complex partial seizure .vs.  mood disorder .vs.  pseudoseizure--  negative EEG's   confusion episodes and staring spells since 11/ 2015   (03-26-2020 per pt wife last seizure 10 /2021)   Transient confusion NEUOROLOGIST-  DR Terrace Arabia   Episodes since 11/ 2015--  neurologist dx  differential complex partial seizure  .vs. mood disorder . vs. pseudoseizure   Type 2 diabetes mellitus treated with insulin [facility])    endocrinologist--- dr Everardo All---  (03-26-2020 pt does not check blood sugar at home)   Past Surgical History:  Procedure Laterality Date   AMPUTATION Left 04/28/2020   Procedure: LEFT LITTLE FINGER AMPUTATION;   Surgeon: Nadara Mustard, MD;  Location: [facility] OR;  Service: Orthopedics;  Laterality: Left;   CARDIAC CATHETERIZATION  12-27-2001  DR Jacinto Halim  &  05-26-2009  DR Eldridge Dace   RESULTS FOR BOTH ARE NORMAL CORONARIES AND PERSERVED LVF/ EF 60%   CARPAL TUNNEL RELEASE Bilateral right 09-16-2003;  left ?   CARPAL TUNNEL RELEASE Left 02/25/2015   Procedure: LEFT CARPAL TUNNEL RELEASE;  Surgeon: Betha Loa, MD;  Location:  SURGERY CENTER;  Service: Orthopedics;  Laterality: Left;   CYSTOSCOPY N/A 10/10/2012   Procedure: CYSTOSCOPY CLOT EVACUATION FULGERATION OF BLEEDERS ;  Surgeon: Garnett Farm, MD;  Location: [facility];  Service: Urology;  Laterality: N/A;   CYSTOSCOPY WITH BIOPSY N/A 11/26/2015   Procedure: CYSTOSCOPY WITH BIOPSY AND FULGURATION;  Surgeon: Ihor Gully, MD;  Location: [facility];  Service: Urology;  Laterality: N/A;   ESOPHAGOGASTRODUODENOSCOPY  2014   LAPAROSCOPIC CHOLECYSTECTOMY  11-17-2010   ORCHIECTOMY Right 02/21/2016   Procedure: SCROTAL ORCHIECTOMY with TESTICULAR PROSTHESIS IMPLANT;  Surgeon: Ihor Gully, MD;  Location: [facility];  Service: Urology;  Laterality: Right;   ORCHIECTOMY Left 09/02/2018   Procedure: ORCHIECTOMY;  Surgeon: Ihor Gully, MD;  Location: [facility];  Service: Urology;  Laterality: Left;   ROTATOR CUFF REPAIR Right 12/2004   TRANSURETHRAL RESECTION OF BLADDER TUMOR N/A 08/09/2012   Procedure: TRANSURETHRAL RESECTION OF BLADDER TUMOR (TURBT) WITH GYRUS WITH MITOMYCIN C;  Surgeon: Garnett Farm, MD;  Location[facility];  Service: Urology;  Laterality: N/A;   TRANSURETHRAL RESECTION OF BLADDER TUMOR N/A 03/29/2020   Procedure: TRANSURETHRAL RESECTION OF BLADDER TUMOR (TURBT) and post-op instillation of gemcitabine;  Surgeon: [name], MD;  Location: [facility];  Service: Urology;  Laterality: N/A;   TRANSURETHRAL RESECTION OF BLADDER TUMOR WITH  GYRUS (TURBT-GYRUS) N/A 02/27/2014   Procedure: TRANSURETHRAL RESECTION OF BLADDER TUMOR WITH GYRUS (TURBT-GYRUS);  Surgeon: Garnett Farm, MD;  Location: [facility];  Service: Urology;  Laterality: N/A;     A IV Location/Drains/Wounds Patient Lines/Drains/Airways Status     Active Line/Drains/Airways     Name Placement date Placement time Site Days   Peripheral IV 08/25/22 20 G 1.88" Right Antecubital 08/25/22  2136  Antecubital  1   Peripheral IV 08/25/22 20 G Anterior;Left Wrist 08/25/22  --  Wrist  1   Wound / Incision (Open or Dehisced) Finger (Comment which one) Right;Other (Comment) opened non healing wound between index and thumb of right hand --  --  Finger (Comment which one)  --            Intake/Output Last  24 hours  Intake/Output Summary (Last 24 hours) at 08/26/2022 0327 Last data filed at 08/26/2022 0010 Gross per 24 hour  Intake 2000 ml  Output --  Net 2000 ml    Labs/Imaging Results for orders placed or performed during the hospital encounter of 08/25/22 (from the past 48 hour(s))  Lactic acid, plasma     Status: Abnormal   Collection Time: 08/25/22  9:37 PM  Result Value Ref Range   Lactic Acid, Venous 2.2 (HH) 0.5 - 1.9 mmol/L    Comment: CRITICAL RESULT CALLED TO, READ BACK BY AND VERIFIED WITH Boyce Medici RN 08/25/22 2242 [place]erry Performed at [facility], [address] [place], [place] [address]   Comprehensive metabolic panel     Status: Abnormal   Collection Time: 08/25/22  9:37 PM  Result Value Ref Range   Sodium 130 (L) 135 - 145 mmol/L   Potassium 4.0 3.5 - 5.1 mmol/L   Chloride 97 (L) 98 - 111 mmol/L   CO2 23 22 - 32 mmol/L   Glucose, Bld 256 (H) 70 - 99 mg/dL    Comment: Glucose reference range applies only to samples taken after fasting for at least 8 hours.   BUN 34 (H) 6 - 20 mg/dL   Creatinine, Ser 6.04 (H) 0.61 - 1.24 mg/dL   Calcium 9.1 8.9 - 54.0 mg/dL   Total Protein 6.4 (L) 6.5 - 8.1 g/dL   Albumin  3.2 (L) 3.5 - 5.0 g/dL   AST 12 (L) 15 - 41 U/L   ALT 13 0 - 44 U/L   Alkaline Phosphatase 83 38 - 126 U/L   Total Bilirubin 0.4 0.3 - 1.2 mg/dL   GFR, Estimated 44 (L) >60 mL/min    Comment: (NOTE) Calculated using the CKD-EPI Creatinine Equation (2021)    Anion gap 10 5 - 15    Comment: Performed at [facility], [address] [place], [place] [address]  CBC with Differential     Status: Abnormal   Collection Time: 08/25/22  9:37 PM  Result Value Ref Range   WBC 11.5 (H) 4.0 - 10.5 K/uL   RBC 4.68 4.22 - 5.81 MIL/uL   Hemoglobin 12.6 (L) 13.0 - 17.0 g/dL   HCT 14.7 (L) 82.9 - 56.2 %   MCV 78.0 (L) 80.0 - 100.0 fL   MCH 26.9 26.0 - 34.0 pg   MCHC 34.5 30.0 - 36.0 g/dL   RDW 13.0 86.5 - 78.4 %   Platelets 466 (H) 150 - 400 K/uL   nRBC 0.0 0.0 - 0.2 %   Neutrophils Relative % 59 %   Neutro Abs 6.7 1.7 - 7.7 K/uL   Lymphocytes Relative 30 %   Lymphs Abs 3.5 0.7 - 4.0 K/uL   Monocytes Relative 7 %   Monocytes Absolute 0.8 0.1 - 1.0 K/uL   Eosinophils Relative 2 %   Eosinophils Absolute 0.2 0.0 - 0.5 K/uL   Basophils Relative 1 %   Basophils Absolute 0.2 (H) 0.0 - 0.1 K/uL   Immature Granulocytes 1 %   Abs Immature Granulocytes 0.13 (H) 0.00 - 0.07 K/uL    Comment: Performed at [facility], [address], [place], [place] [address]  Protime-INR     Status: None   Collection Time: 08/25/22  9:37 PM  Result Value Ref Range   Prothrombin Time 12.8 11.4 - 15.2 seconds   INR 1.0 0.8 - 1.2    Comment: (NOTE) INR goal varies  based on device and disease states. Performed at [facility], [address] [place], [place] [address]   APTT     Status: None   Collection Time: 08/25/22  9:37 PM  Result Value Ref Range   aPTT 30 24 - 36 seconds    Comment: Performed at [facility], [address], [place], [place] [address]  Lipase, blood     Status: Abnormal   Collection Time: 08/25/22  9:37 PM  Result Value Ref Range   Lipase 54 (H) 11 - 51 U/L     Comment: Performed at [facility], [address], [place], [place] [address]  Troponin I (High Sensitivity)     Status: None   Collection Time: 08/25/22  9:37 PM  Result Value Ref Range   Troponin I (High Sensitivity) 6 <18 ng/L    Comment: (NOTE) Elevated high sensitivity troponin I (hsTnI) values and significant  changes across serial measurements may suggest ACS but many other  chronic and acute conditions are known to elevate hsTnI results.  Refer to the "Links" section for chest pain algorithms and additional  guidance. Performed at [facility], [address], [place], [place] [address]   I-Stat venous blood gas, ED ([facility])     Status: Abnormal   Collection Time: 08/25/22  9:45 PM  Result Value Ref Range   pH, Ven 7.385 7.25 - 7.43   pCO2, Ven 40.2 (L) 44 - 60 mmHg   pO2, Ven 25 (LL) 32 - 45 mmHg   Bicarbonate 24.1 20.0 - 28.0 mmol/L   TCO2 25 22 - 32 mmol/L   O2 Saturation 43 %   Acid-base deficit 1.0 0.0 - 2.0 mmol/L   Sodium 132 (L) 135 - 145 mmol/L   Potassium 4.2 3.5 - 5.1 mmol/L   Calcium, Ion 1.14 (L) 1.15 - 1.40 mmol/L   HCT 36.0 (L) 39.0 - 52.0 %   Hemoglobin 12.2 (L) 13.0 - 17.0 g/dL   Sample type VENOUS    Comment NOTIFIED PHYSICIAN   I-Stat Chem 8, ED     Status: Abnormal   Collection Time: 08/25/22  9:45 PM  Result Value Ref Range   Sodium 131 (L) 135 - 145 mmol/L   Potassium 4.2 3.5 - 5.1 mmol/L   Chloride 99 98 - 111 mmol/L   BUN 33 (H) 6 - 20 mg/dL   Creatinine, Ser 6.57 (H) 0.61 - 1.24 mg/dL   Glucose, Bld 846 (H) 70 - 99 mg/dL    Comment: Glucose reference range applies only to samples taken after fasting for at least 8 hours.   Calcium, Ion 1.16 1.15 - 1.40 mmol/L   TCO2 23 22 - 32 mmol/L   Hemoglobin 12.6 (L) 13.0 - 17.0 g/dL   HCT 96.2 (L) 95.2 - 84.1 %  Lactic acid, plasma     Status: Abnormal   Collection Time: 08/25/22 11:33 PM  Result Value Ref Range   Lactic Acid, Venous 2.0 (HH) 0.5 - 1.9 mmol/L    Comment: CRITICAL  VALUE NOTED. VALUE IS CONSISTENT WITH PREVIOUSLY REPORTED/CALLED VALUE Performed at [facility], [address] [place], [place] [address]   Troponin I (High Sensitivity)     Status: None   Collection Time: 08/25/22 11:33 PM  Result Value Ref Range   Troponin I (High Sensitivity) 5 <18 ng/L    Comment: (NOTE) Elevated high sensitivity troponin I (hsTnI) values and significant  changes across serial measurements may  suggest ACS but many other  chronic and acute conditions are known to elevate hsTnI results.  Refer to the "Links" section for chest pain algorithms and additional  guidance. Performed at [facility], [address], [place], [place] [address]    DG Thoracic Spine 2 View  Result Date: 08/26/2022 CLINICAL DATA:  Back pain and weakness EXAM: THORACIC SPINE 2 VIEWS COMPARISON:  None Available. FINDINGS: There is no evidence of thoracic spine fracture. Alignment is normal. No other significant bone abnormalities are identified. IMPRESSION: No acute abnormality noted. Electronically Signed   By: Alcide Clever M.D.   On: 08/26/2022 02:58   DG Chest Port 1 View  Result Date: 08/25/2022 CLINICAL DATA:  Question of sepsis to evaluate for abnormality. Weakness and lethargy for 3 days. Confusion. Chest pain EXAM: PORTABLE CHEST 1 VIEW COMPARISON:  05/23/2022 FINDINGS: The heart size and mediastinal contours are within normal limits. Both lungs are clear. The visualized skeletal structures are unremarkable. IMPRESSION: No active disease. Electronically Signed   By: Burman Nieves M.D.   On: 08/25/2022 21:55    Pending Labs Unresulted Labs (From admission, onward)     Start     Ordered   08/26/22 0319  Folate  Once,   R        08/26/22 0319   08/26/22 0249  Vitamin B12  Once,   R        08/26/22 0248   08/26/22 0205  RPR  Once,   URGENT        08/26/22 0204   08/26/22 0205  HIV Antibody (routine testing w rflx)  (HIV Antibody (Routine testing w reflex) panel)  Once,    URGENT        08/26/22 0204   08/25/22 2324  Rapid urine drug screen (hospital performed)  ONCE - STAT,   STAT       Question:  Release to patient  Answer:  Immediate   08/25/22 2323   08/25/22 2130  Blood Culture (routine x 2)  (Undifferentiated presentation (screening labs and basic nursing orders))  BLOOD CULTURE X 2,   STAT      08/25/22 2130   08/25/22 2130  Urinalysis, w/ Reflex to Culture (Infection Suspected) -Urine, Clean Catch  (Undifferentiated presentation (screening labs and basic nursing orders))  Once,   URGENT       Question:  Specimen Source  Answer:  Urine, Clean Catch   08/25/22 2130            Vitals/Pain Today's Vitals   08/26/22 0145 08/26/22 0200 08/26/22 0215 08/26/22 0230  BP: (!) 140/61 (!) 140/64 (!) 150/80 (!) 157/83  Pulse: 80 80 81 79  Resp:      Temp:      TempSrc:      SpO2: 98% 98% 98% 98%  Weight:      Height:        Isolation Precautions No active isolations  Medications Medications  pregabalin (LYRICA) capsule 600 mg (has no administration in time range)  0.9 %  sodium chloride infusion (has no administration in time range)  insulin aspart (novoLOG) injection 0-15 Units (has no administration in time range)  lactated ringers bolus 1,000 mL (0 mLs Intravenous Stopped 08/25/22 2249)  lactated ringers bolus 1,000 mL (0 mLs Intravenous Stopped 08/26/22 0010)    Mobility walks with device     Focused Assessments Here for hypotension and weakness has not been able to stand for 3 days usually uses a  walker to get around   R Recommendations: See Admitting Provider Note  Report given to:   Additional Notes: BP 159/82 84 20 100%ra 98.4 a/o x 4 walks with walker bladder drained via in/out cath 1500 ua sample sent

## 2022-08-26 NOTE — Plan of Care (Signed)

## 2022-08-26 NOTE — Progress Notes (Signed)
Subjective: Patient admitted this morning, see detailed H&P by  Dr Bradley Ferris 57 year old male with medical history of uncontrolled diabetes melitis with A1c more than 15, peripheral neuropathy, anxiety disorder, chronic pain syndrome, bipolar 2 disorder, PTSD, diabetes mellitus type 2, rheumatoid arthritis, CAD, COPD, GERD, history of bladder cancer and gastroparesis was brought to the hospital for confusion, hypotension.  He was recently started on baclofen, Requip, hydroxyzine.  He also has history of autonomic dysfunction causing orthostatic hypotension.  He also has been having episodes of forgetfulness. In the ED patient's confusion had resolved.   Vitals:   08/26/22 0511 08/26/22 0720  BP: (!) 166/101 (!) 160/86  Pulse: 81 82  Resp: 20 18  Temp: 98.2 F (36.8 C) 98.6 F (37 C)  SpO2: 100% 99%      A/P Acute encephalopathy -Likely medication induced, resolved -Baclofen, Atarax, Requip on hold -Likely from underlying dementia -CT head from last admission was normal  Bilateral lower extremity weakness/peripheral neuropathy -PT consulted, -MRI of lumbar spine showed no spinal stenosis and largely normal MRI, mild central disc protrusion with annular fissure of L5-S1.  No neural impingement.  Mild left L3 neural foraminal stenosis due to disc bulging. -Continue Lyrica  Orthostatic hypotension -Likely from autonomic dysfunction -Bilateral TED hose, abdominal binder ordered  Lactic acidosis -Likely in setting of hypotension -Resolved  Diabetes mellitus type 2 -Continue sliding scale insulin with NovoLog -Continue Levemir  BPH -Continue Flomax  Hyperlipidemia -Continue Crestor  Bipolar disorder -Continue Kasandra Knudsen   Meredeth Ide [facility]

## 2022-08-26 NOTE — H&P (Signed)
PCP:   Dois Davenport, MD   Chief Complaint:  Confusion/hypotension  HPI: This is a 57 year old male with past medical history significant for uncontrolled diabetes with A1c > 15, peripheral neuropathy, anxiety disorder, chronic pain syndrome, bipolar 2 disorder, PTSD, DM type 2,  RA, CAD, COPD, GERD, h/o bladder cancer and gastroparesis.  Patient brought in because he became acutely confused, he did not even know his own name, weak to the point he was unable to walk, and hypotensive.  There is no reports of prior illness, no fevers, chills, nausea, vomiting, diarrhea.  His SBP when EMS arrived was 60.  He was sent to the ER.  His new medications include black offending 3 times daily, Requip q. hours sleep and hydroxyzine.  These were all started after 08/01/2022.  The patient has what is believed to be a history of autonomic dysfunction leading to orthostatic hypotension.  Per patient's sister who is at bedside.  She has been doing orthostatic vitals at home, his blood pressure and pulse rate have remained normal.  The patient does complain of dizziness on awakening, it is a point he has to sit on the side of the bed for almost 15 minutes prior to standing.  His BP has never been checked at that point.  He denies alcohol or drug use.  On further questioning, patient's sister reports patient has been forgetting a lot.  He asks the same questions repeatedly, only to forget an hour later.  He is unable to put his medications together as he gets confused.  He has great memory for past events. Prior to his sister coming patient states he was falling to 3 times daily.  He may have had greater than 18 falls in the prior 12 months.  He occasionally states his head.  He has severe low back pain that radiates down bilateral thighs.  He denies incontinence.  He has severe peripheral neuropathy.Marland Kitchen  He is unable to feel his feet.  He has to look where he places his feet.  Over the past 12 months he has become  progressively weaker, now using a 4-prong walker at this is insistent for safety.  Patient no longer fall since the cyst has been there for the last month.  Patient recently admitted 5/7-5/9 with similar symptoms.  He was diagnosed with autonomic dysfunction.  Patient left prior to safe discharge at his own insistence.  In the ER patient's hypotension resolved after fluids.  Confusion almost resolved.  His weakness persists, he states he is currently unable to walk.   Review of Systems:  Per HPI.  Past Medical History: Past Medical History:  Diagnosis Date   Anxiety    Benign essential tremor    Bipolar 2 disorder (HCC)    followed by [facility]--- dr s. Lolly Mustache   Bladder cancer [facility])    recurrent   CAD (coronary artery disease)    cardiac cath 2003  and 2011 both showed normal coronary arteries w/ preserved lvf;  Non obstructive on CTA Oct 2019.    Chronic pain syndrome    back---- [facility] [place]   Cold extremities    BLE   COPD (chronic obstructive pulmonary disease) (HCC)    DDD (degenerative disc disease), lumbar    Diabetic peripheral neuropathy (HCC)    Gastroparesis    followed by dr Marina Goodell   GERD (gastroesophageal reflux disease)    Hiatal hernia    History of bladder cancer urologist-  previously dr  ottlein;  now dr gay   papillay TCC (Ta G1)  s/p TURBT and chemo instillation 2014   History of chest pain 12/2017   heart cath normal   History of encephalopathy 05/27/2015   admission w/ acute encephalopathy thought to be secondary to pain meds and COPD   History of gastric ulcer    History of Helicobacter pylori infection    History of kidney stones    History of TIA (transient ischemic attack) 2008  and 10-19-2018    no residual's   History of traumatic head injury 2010   w/ LOC  per pt needed stitches, hit in head with a mower blade   Hyperlipidemia    Hypertension    Hypogonadism male    s/p  bilateral orchiectomy   Hypothyroidism    Insomnia     Mild obstructive sleep apnea    study in epic 12-04-2016, no cpap   PTSD (post-traumatic stress disorder)    chronic   PTSD (post-traumatic stress disorder)    RA (rheumatoid arthritis) (HCC)    followed by [facility]   Seizures, transient [facility]) neurologist-  dr Terrace Arabia--  differential dx complex partial seizure .vs.  mood disorder .vs.  pseudoseizure--  negative EEG's   confusion episodes and staring spells since 11/ 2015   (03-26-2020 per pt wife last seizure 10 /2021)   Transient confusion NEUOROLOGIST-  DR Terrace Arabia   Episodes since 11/ 2015--  neurologist dx  differential complex partial seizure  .vs. mood disorder . vs. pseudoseizure   Type 2 diabetes mellitus treated with insulin [facility])    endocrinologist--- dr Everardo All---  (03-26-2020 pt does not check blood sugar at home)   Past Surgical History:  Procedure Laterality Date   AMPUTATION Left 04/28/2020   Procedure: LEFT LITTLE FINGER AMPUTATION;  Surgeon: Nadara Mustard, MD;  Location: [facility] OR;  Service: Orthopedics;  Laterality: Left;   CARDIAC CATHETERIZATION  12-27-2001  DR Jacinto Halim  &  05-26-2009  DR Eldridge Dace   RESULTS FOR BOTH ARE NORMAL CORONARIES AND PERSERVED LVF/ EF 60%   CARPAL TUNNEL RELEASE Bilateral right 09-16-2003;  left ?   CARPAL TUNNEL RELEASE Left 02/25/2015   Procedure: LEFT CARPAL TUNNEL RELEASE;  Surgeon: Betha Loa, MD;  Location: [facility];  Service: Orthopedics;  Laterality: Left;   CYSTOSCOPY N/A 10/10/2012   Procedure: CYSTOSCOPY CLOT EVACUATION FULGERATION OF BLEEDERS ;  Surgeon: Garnett Farm, MD;  Location: [facility];  Service: Urology;  Laterality: N/A;   CYSTOSCOPY WITH BIOPSY N/A 11/26/2015   Procedure: CYSTOSCOPY WITH BIOPSY AND FULGURATION;  Surgeon: Ihor Gully, MD;  Location: [facility];  Service: Urology;  Laterality: N/A;   ESOPHAGOGASTRODUODENOSCOPY  2014   LAPAROSCOPIC CHOLECYSTECTOMY  11-17-2010   ORCHIECTOMY Right 02/21/2016   Procedure:  SCROTAL ORCHIECTOMY with TESTICULAR PROSTHESIS IMPLANT;  Surgeon: Ihor Gully, MD;  Location: [facility];  Service: Urology;  Laterality: Right;   ORCHIECTOMY Left 09/02/2018   Procedure: ORCHIECTOMY;  Surgeon: Ihor Gully, MD;  Location: [facility];  Service: Urology;  Laterality: Left;   ROTATOR CUFF REPAIR Right 12/2004   TRANSURETHRAL RESECTION OF BLADDER TUMOR N/A 08/09/2012   Procedure: TRANSURETHRAL RESECTION OF BLADDER TUMOR (TURBT) WITH GYRUS WITH MITOMYCIN C;  Surgeon: Garnett Farm, MD;  Location: [facility];  Service: Urology;  Laterality: N/A;   TRANSURETHRAL RESECTION OF BLADDER TUMOR N/A 03/29/2020   Procedure: TRANSURETHRAL RESECTION OF BLADDER TUMOR (TURBT) and post-op instillation of  gemcitabine;  Surgeon: [name], MD;  Location: [facility];  Service: Urology;  Laterality: N/A;   TRANSURETHRAL RESECTION OF BLADDER TUMOR WITH GYRUS (TURBT-GYRUS) N/A 02/27/2014   Procedure: TRANSURETHRAL RESECTION OF BLADDER TUMOR WITH GYRUS (TURBT-GYRUS);  Surgeon: Garnett Farm, MD;  Location: [facility];  Service: Urology;  Laterality: N/A;    Medications: Prior to Admission medications   Medication Sig Start Date End Date Taking? Authorizing Provider  acetaminophen (TYLENOL) 500 MG tablet Take 1,000 mg by mouth every 4 (four) hours as needed for moderate pain or headache.   Yes [provider]  baclofen (LIORESAL) 10 MG tablet Take 10 mg by mouth 3 (three) times daily as needed for muscle spasms. 08/01/22  Yes [provider]  clindamycin (CLEOCIN) 300 MG capsule Take 300 mg by mouth 3 (three) times daily.   Yes [provider]  dicyclomine (BENTYL) 20 MG tablet Take 1 tablet (20 mg total) by mouth 2 (two) times daily as needed (abdominal cramping). 06/21/22  Yes Mardene Sayer, MD  famotidine (PEPCID) 20 MG tablet Take 1 tablet (20 mg total) by mouth 2 (two) times daily.  06/21/22  Yes Mardene Sayer, MD  HUMALOG KWIKPEN 200 UNIT/ML KwikPen Inject 25 Units into the skin in the morning, at noon, and at bedtime. 06/17/21  Yes [provider]  hydrOXYzine (ATARAX) 25 MG tablet Take 25 mg by mouth at bedtime as needed for anxiety or itching. 08/01/22  Yes [provider]  insulin glargine, 2 Unit Dial, (TOUJEO MAX SOLOSTAR) 300 UNIT/ML Solostar Pen Inject 100 Units into the skin daily. Patient taking differently: Inject 80 Units into the skin daily. 05/27/22  Yes Calvert Cantor, MD  losartan (COZAAR) 25 MG tablet Take 25 mg by mouth daily.   Yes [provider]  lurasidone (LATUDA) 20 MG TABS tablet Take 40 mg by mouth at bedtime.   Yes [provider]  omeprazole (PRILOSEC) 40 MG capsule Take 40 mg by mouth daily. 08/23/21  Yes [provider]  pregabalin (LYRICA) 200 MG capsule Take 200 mg by mouth 3 (three) times daily. 12/25/21  Yes Dois Davenport, MD  rOPINIRole (REQUIP) 0.5 MG tablet Take 0.5 mg by mouth at bedtime. 07/25/22  Yes [provider]  tamsulosin (FLOMAX) 0.4 MG CAPS capsule Take 0.4 mg by mouth at bedtime.   Yes [provider]  ibuprofen (ADVIL) 200 MG tablet Take 600 mg by mouth 2 (two) times daily. PRN    [provider]  rosuvastatin (CRESTOR) 10 MG tablet Take 1 tablet (10 mg total) by mouth daily. Patient taking differently: Take 10 mg by mouth at bedtime. 08/04/21 07/18/22  Lauro Franklin, MD    Allergies:   Allergies  Allergen Reactions   Celecoxib Anaphylaxis, Swelling and Rash    Tongue swells   Hydrocodone Rash and Other (See Comments)    "blisters developed on arms"    Sulfa Antibiotics Rash   Sulfacetamide Sodium Rash    Social History:  reports that he has been smoking cigarettes. He has a 57.00 pack-year smoking history. He has never used smokeless tobacco. He reports that he does not drink alcohol and does not use drugs.  Family History: Family  History  Problem Relation Age of Onset   Diabetes Mother    Diabetes Father    Hypertension Father    Heart attack Father 25       died age 77   Alcohol abuse  Father    Colon cancer Neg Hx    Esophageal cancer Neg Hx    Stomach cancer Neg Hx    Rectal cancer Neg Hx     Physical Exam: Vitals:   08/26/22 0045 08/26/22 0100 08/26/22 0115 08/26/22 0129  BP: (!) 155/83 (!) 164/83 (!) 155/76   Pulse: 79 (!) 123 80   Resp:      Temp:    98.4 F (36.9 C)  TempSrc:    Oral  SpO2: 98% (!) 80% 100%   Weight:      Height:        General:  Alert and oriented times three, well developed and nourished, no acute distress Eyes: PERRLA, pink conjunctiva, no scleral icterus ENT: Moist oral mucosa, neck supple, no thyromegaly Lungs: clear to ascultation, no wheeze, no crackles, no use of accessory muscles Cardiovascular: regular rate and rhythm, no regurgitation, no gallops, no murmurs. No carotid bruits, no JVD Abdomen: soft, positive BS, non-tender, non-distended, no organomegaly, not an acute abdomen GU: not examined Neuro: CN II - XII grossly intact, sensation intact Musculoskeletal: strength 4.5/5 all extremities, no clubbing, cyanosis or edema. Sensation 0/5 left leg mid tibia to foot. 1/5 RLE Skin: no rash, no subcutaneous crepitation, no decubitus Psych: appropriate patient  Labs on Admission:  Recent Labs    08/25/22 2137 08/25/22 2145  NA 130* 132*  131*  K 4.0 4.2  4.2  CL 97* 99  CO2 23  --   GLUCOSE 256* 253*  BUN 34* 33*  CREATININE 1.79* 1.80*  CALCIUM 9.1  --    Recent Labs    08/25/22 2137  AST 12*  ALT 13  ALKPHOS 83  BILITOT 0.4  PROT 6.4*  ALBUMIN 3.2*   Recent Labs    08/25/22 2137  LIPASE 54*   Recent Labs    08/25/22 2137 08/25/22 2145  WBC 11.5*  --   NEUTROABS 6.7  --   HGB 12.6* 12.2*  12.6*  HCT 36.5* 36.0*  37.0*  MCV 78.0*  --   PLT 466*  --     Radiological Exams on Admission: DG Chest Port 1 View  Result Date:  08/25/2022 CLINICAL DATA:  Question of sepsis to evaluate for abnormality. Weakness and lethargy for 3 days. Confusion. Chest pain EXAM: PORTABLE CHEST 1 VIEW COMPARISON:  05/23/2022 FINDINGS: The heart size and mediastinal contours are within normal limits. Both lungs are clear. The visualized skeletal structures are unremarkable. IMPRESSION: No active disease. Electronically Signed   By: Burman Nieves M.D.   On: 08/25/2022 21:55    Assessment/Plan Present on Admission:  AKI (acute kidney injury) (HCC) -Creatinine 1.8, baseline creatinine normal (06/21/22) -IV fluid hydration -Strict I's and O's   Acute encephalopathy -Likely secondary to hypotension.  Resolving -Suspect underlying dementia.  RPR, vitamin B12, Folate ordered. -TSH previously done and normal -CT head done last admission normal.   Bilateral lower extremity weakness/peripheral severe peripheral neuropathy -Due to years of uncontrolled DM.  Patient remains noncompliant with a diabetic diet -Patient complains of back pain with radiation into bilateral legs.  Occurring after repeated falls.  Will order MRI lumbar spine -PT/OT consult -Suspect patient simply declining.  May benefit from SNF   Hypotension/lactic acidosis/ -Corrected with IV fluid hydration.  Like related to patient's autonomic dysfunction -Orthostatic vitals ordered   Lactic acidosis -Likely secondary to hypotension. -IV fluids, then repeat.  No evidence of infection   Type 2 diabetes mellitus with hyperglycemia (HCC) -Sliding scale insulin,  Levemir resumed   BPH -Flomax resumed  Hyperlipidemia -Crestor resumed  Chronic post-traumatic stress disorder (PTSD)  Bipolar II disorder, most recent episode major depressive (HCC) -Latuda resumed  History of bladder cancer  Sherrill Mckamie 08/26/2022, 1:31 AM

## 2022-08-26 NOTE — Evaluation (Signed)
Occupational Therapy Evaluation Patient Details Name: Vincent [name] MRN: [id number] DOB: [birth year] Today's Date: 08/26/2022   History of Present Illness 57 year old male who presents with confusion, weakness, and hypotension. AKI present on admission. PMHx: uncontrolled diabetes with A1c > 15, peripheral neuropathy, anxiety disorder, chronic pain syndrome, bipolar 2 disorder, PTSD, DM type 2,  RA, CAD, COPD, GERD, h/o bladder cancer and gastroparesis.   Clinical Impression   Pt evaluated s/p above admission list. Pt reports modified independence with mobility using rollator with and receives assistance from sister for showering and IADLs at baseline.  Pt reports significant fall history and sister works full-time. During session, pt limited secondary to generalized weakness, activity tolerance and symptomatic OH with RN present/updated. Pt currently requires supervision A for seated UB ADLs and max A +2 for LB ADLs. Pt completed STS transfer from EOB with mod A +2 via bilateral HHA. Pt unable to progress with steps this session secondary to BLE weakness and symptomatic OH. Pt returned to supine at end of session with VSS. Pt would benefit from continued acute OT services to maximize functional independence and facilitate transition to skilled inpatient follow up therapy, <3 hours/day.  Orthostatic Blood pressures: BP in supine: 151/82, BP sitting EOB: 126/75, BP sitting EOB for 5 minutes: 133/85, BP standing: 92/58, BP return to supine: 142/75      Recommendations for follow up therapy are one component of a multi-disciplinary discharge planning process, led by the attending physician.  Recommendations may be updated based on patient status, additional functional criteria and insurance authorization.   Assistance Recommended at Discharge Frequent or constant Supervision/Assistance  Patient can return home with the following Two people to help with walking and/or transfers;Two people to help with  bathing/dressing/bathroom;Assistance with cooking/housework;Direct supervision/assist for medications management;Direct supervision/assist for financial management;Assist for transportation;Help with stairs or ramp for entrance    Functional Status Assessment  Patient has had a recent decline in their functional status and demonstrates the ability to make significant improvements in function in a reasonable and predictable amount of time.  Equipment Recommendations  Other (comment) (defer)    Recommendations for Other Services       Precautions / Restrictions Precautions Precautions: Fall Precaution Comments: watch BP Restrictions Weight Bearing Restrictions: No      Mobility Bed Mobility Overal bed mobility: Needs Assistance Bed Mobility: Supine to Sit, Sit to Supine     Supine to sit: HOB elevated, Supervision Sit to supine: HOB elevated, Supervision   General bed mobility comments: HOB elevated, use of bed rail    Transfers Overall transfer level: Needs assistance   Transfers: Sit to/from Stand Sit to Stand: Mod assist, +2 safety/equipment, +2 physical assistance           General transfer comment: STS transfer from EOB with mod A +2 via bilat HHA. Required BUE support for stability.      Balance Overall balance assessment: Needs assistance Sitting-balance support: Feet supported, Single extremity supported Sitting balance-Leahy Scale: Poor Sitting balance - Comments: sitting EOB   Standing balance support: Bilateral upper extremity supported, During functional activity Standing balance-Leahy Scale: Poor Standing balance comment: required BUE support on chair for stability, RW not present on unit                           ADL either performed or assessed with clinical judgement   ADL Overall ADL's : Needs assistance/impaired Eating/Feeding: Sitting;Modified independent   Grooming: Set up;Sitting  Upper Body Bathing: Supervision/  safety;Sitting   Lower Body Bathing: Sit to/from stand;+2 for physical assistance;+2 for safety/equipment;Maximal assistance   Upper Body Dressing : Sitting;Supervision/safety   Lower Body Dressing: Maximal assistance;Sit to/from stand;+2 for physical assistance;+2 for safety/equipment   Toilet Transfer: Moderate assistance;+2 for physical assistance;+2 for safety/equipment;BSC/3in1 Toilet Transfer Details (indicate cue type and reason): simulated Toileting- Clothing Manipulation and Hygiene: Maximal assistance;+2 for physical assistance;+2 for safety/equipment;Sit to/from stand       Functional mobility during ADLs: Moderate assistance;+2 for physical assistance;+2 for safety/equipment;Rolling walker (2 wheels) General ADL Comments: limited by symptomatic OH and bilat LE weakness     Vision Baseline Vision/History: 0 No visual deficits Ability to See in Adequate Light: 0 Adequate Vision Assessment?: No apparent visual deficits     Perception Perception Perception Tested?: No   Praxis Praxis Praxis tested?: Not tested    Pertinent Vitals/Pain Pain Assessment Pain Assessment: No/denies pain     Hand Dominance Left   Extremity/Trunk Assessment Upper Extremity Assessment Upper Extremity Assessment: Generalized weakness   Lower Extremity Assessment Lower Extremity Assessment: Defer to PT evaluation   Cervical / Trunk Assessment Cervical / Trunk Assessment: Kyphotic   Communication Communication Communication: No difficulties   Cognition Arousal/Alertness: Awake/alert Behavior During Therapy: WFL for tasks assessed/performed Overall Cognitive Status: No family/caregiver present to determine baseline cognitive functioning                                 General Comments: A+Ox4, follows simple 1-step commands with increased time     General Comments  BP in supine: 151/82, BP sitting EOB: 126/75, BP sitting EOB for 5 minutes: 133/85, BP standing: 92/58,  BP return to supine: 142/75, pt symptomatic and RN present/updated    Exercises     Shoulder Instructions      Home Living Family/patient expects to be discharged to:: Private residence Living Arrangements: Other relatives (sister) Available Help at Discharge: Family Type of Home: Mobile home Home Access: Stairs to enter Entrance Stairs-Number of Steps: 5 Entrance Stairs-Rails: Right;Left Home Layout: One level     Bathroom Shower/Tub: Walk-in shower;Tub/shower unit   Bathroom Toilet: Handicapped height     Home Equipment: Rollator (4 wheels);Cane - single point   Additional Comments: Sister lives with pt but works full-time      Prior Functioning/Environment Prior Level of Function : History of Falls (last six months);Independent/Modified Independent             Mobility Comments: mod I with use of rollator at all times, reports significant fall history ADLs Comments: mod I with dressing and toileting. Sister assists with showering and IADLs        OT Problem List: Decreased strength;Decreased range of motion;Decreased activity tolerance;Impaired balance (sitting and/or standing);Decreased safety awareness;Decreased knowledge of precautions      OT Treatment/Interventions: Self-care/ADL training;Energy conservation;DME and/or AE instruction;Therapeutic activities;Cognitive remediation/compensation;Patient/family education;Balance training    OT Goals(Current goals can be found in the care plan section) Acute Rehab OT Goals Patient Stated Goal: to get stronger OT Goal Formulation: With patient Time For Goal Achievement: 09/09/22 Potential to Achieve Goals: Good ADL Goals Pt Will Perform Lower Body Dressing: with min guard assist;sit to/from stand Pt Will Transfer to Toilet: bedside commode;with min guard assist;stand pivot transfer Pt Will Perform Toileting - Clothing Manipulation and hygiene: with min guard assist;sit to/from stand  OT Frequency: Min 2X/week     Co-evaluation  AM-PAC OT "6 Clicks" Daily Activity     Outcome Measure Help from another person eating meals?: None Help from another person taking care of personal grooming?: A Little Help from another person toileting, which includes using toliet, bedpan, or urinal?: A Lot Help from another person bathing (including washing, rinsing, drying)?: A Lot Help from another person to put on and taking off regular upper body clothing?: A Little Help from another person to put on and taking off regular lower body clothing?: A Lot 6 Click Score: 16   End of Session Equipment Utilized During Treatment: Gait belt Nurse Communication: Mobility status  Activity Tolerance: Treatment limited secondary to medical complications (Comment) (symptomatic OH) Patient left: in bed;with call bell/phone within reach;with bed alarm set  OT Visit Diagnosis: Unsteadiness on feet (R26.81);Other abnormalities of gait and mobility (R26.89);Repeated falls (R29.6);Muscle weakness (generalized) (M62.81);Dizziness and giddiness (R42)                Time: 1001-1027 OT Time Calculation (min): 26 min Charges:  OT General Charges $OT Visit: 1 Visit OT Evaluation $OT Eval Moderate Complexity: 1 Mod OT Treatments $Therapeutic Activity: 8-22 mins  Sherley Bounds, OTS Acute Rehabilitation Services Office [phone] Secure Chat Communication Preferred   Sherley Bounds 08/26/2022, 1:24 PM

## 2022-08-27 ENCOUNTER — Observation Stay (HOSPITAL_COMMUNITY): Payer: Medicare HMO

## 2022-08-27 DIAGNOSIS — I959 Hypotension, unspecified: Secondary | ICD-10-CM | POA: Diagnosis not present

## 2022-08-27 DIAGNOSIS — N179 Acute kidney failure, unspecified: Secondary | ICD-10-CM | POA: Diagnosis not present

## 2022-08-27 DIAGNOSIS — E782 Mixed hyperlipidemia: Secondary | ICD-10-CM

## 2022-08-27 DIAGNOSIS — Z87898 Personal history of other specified conditions: Secondary | ICD-10-CM

## 2022-08-27 DIAGNOSIS — G9340 Encephalopathy, unspecified: Secondary | ICD-10-CM | POA: Diagnosis not present

## 2022-08-27 DIAGNOSIS — R7989 Other specified abnormal findings of blood chemistry: Secondary | ICD-10-CM | POA: Diagnosis not present

## 2022-08-27 LAB — BASIC METABOLIC PANEL
Anion gap: 8 (ref 5–15)
BUN: 20 mg/dL (ref 6–20)
CO2: 21 mmol/L — ABNORMAL LOW (ref 22–32)
Calcium: 8.4 mg/dL — ABNORMAL LOW (ref 8.9–10.3)
Chloride: 102 mmol/L (ref 98–111)
Creatinine, Ser: 1.21 mg/dL (ref 0.61–1.24)
GFR, Estimated: >60 mL/min (ref >60)
Glucose, Bld: 283 mg/dL — ABNORMAL HIGH (ref 70–99)
Potassium: 4.1 mmol/L (ref 3.5–5.1)
Sodium: 131 mmol/L — ABNORMAL LOW (ref 135–145)

## 2022-08-27 LAB — CULTURE, BLOOD (ROUTINE X 2)
Culture: NO GROWTH
Special Requests: ADEQUATE

## 2022-08-27 LAB — CBC
HCT: 35.7 % — ABNORMAL LOW (ref 39.0–52.0)
Hemoglobin: 11.7 g/dL — ABNORMAL LOW (ref 13.0–17.0)
MCH: 26.3 pg (ref 26.0–34.0)
MCHC: 32.8 g/dL (ref 30.0–36.0)
MCV: 80.2 fL (ref 80.0–100.0)
Platelets: 321 10*3/uL (ref 150–400)
RBC: 4.45 MIL/uL (ref 4.22–5.81)
RDW: 13 % (ref 11.5–15.5)
WBC: 5.6 10*3/uL (ref 4.0–10.5)
nRBC: 0 % (ref 0.0–0.2)

## 2022-08-27 LAB — GLUCOSE, CAPILLARY
Glucose-Capillary: 258 mg/dL — ABNORMAL HIGH (ref 70–99)
Glucose-Capillary: 200 mg/dL — ABNORMAL HIGH (ref 70–99)
Glucose-Capillary: 246 mg/dL — ABNORMAL HIGH (ref 70–99)

## 2022-08-27 MED ORDER — LORAZEPAM 2 MG/ML IJ SOLN
1.0000 mg | Freq: Once | INTRAMUSCULAR | Status: AC
  Administered 2022-08-27: 1 mg via INTRAVENOUS
  Filled 2022-08-27: qty 1

## 2022-08-27 NOTE — Progress Notes (Addendum)
[facility]  PROGRESS NOTE  Vincent [name] WUJ:[id number] DOB: 1965/09/03 DOA: 08/25/2022 PCP: Dois Davenport, MD   Brief HPI:   57 year old male with medical history of uncontrolled diabetes melitis with A1c more than 15, peripheral neuropathy, anxiety disorder, chronic pain syndrome, bipolar 2 disorder, PTSD, diabetes mellitus type 2, rheumatoid arthritis, CAD, COPD, GERD, history of bladder cancer and gastroparesis was brought to the hospital for confusion, hypotension.  He was recently started on baclofen, Requip, hydroxyzine.  He also has history of autonomic dysfunction causing orthostatic hypotension.  He also has been having episodes of forgetfulness. In the ED patient's confusion had resolved.    Assessment/Plan:   Acute encephalopathy -Likely medication induced, resolved -Baclofen, Atarax, Requip on hold -Likely from underlying dementia -CT head from last admission was normal - Check MRI brain  Bilateral lower extremity weakness/peripheral neuropathy -PT consulted, -MRI of lumbar spine showed no spinal stenosis and largely normal MRI, mild central disc protrusion with annular fissure of L5-S1.  No neural impingement.  Mild left L3 neural foraminal stenosis due to disc bulging. -Continue Lyrica   Orthostatic hypotension -Likely from autonomic dysfunction -Bilateral TED hose, abdominal binder ordered -Check orthostatics every 4 hours -Consider adding midodrine versus pyridostigmine if blood pressure continues to be low despite TED hose and abdominal binder -check cortisol level in am   Lactic acidosis -Likely in setting of hypotension -Resolved   Diabetes mellitus type 2 -Continue sliding scale insulin with NovoLog -Continue Levemir -CBG well-controlled   BPH -Continue Flomax   Hyperlipidemia -Continue Crestor   Bipolar disorder -Continue Latuda  Rheumatoid arthritis -Continue Plaquenil  History of previous strokelike symptoms -On Plavix placed be  neuro dating back Sept 2022.       Medications     clopidogrel  75 mg Oral Daily   DULoxetine  60 mg Oral Daily   famotidine  20 mg Oral BID   hydroxychloroquine  200 mg Oral BID   insulin aspart  0-15 Units Subcutaneous TID WC   insulin glargine-yfgn  80 Units Subcutaneous Daily   lamoTRIgine  150 mg Oral BID   lurasidone  40 mg Oral QHS   nicotine  21 mg Transdermal Daily   pregabalin  200 mg Oral TID   tamsulosin  0.4 mg Oral QHS     Data Reviewed:   CBG:  Recent Labs  Lab 08/26/22 0338 08/26/22 0720 08/26/22 1133 08/26/22 1555 08/27/22 0715  GLUCAP 307* 231* 218* 174* 258*    SpO2: 100 %    Vitals:   08/27/22 0716 08/27/22 0928 08/27/22 0931 08/27/22 0935  BP: 124/77 130/75 102/66 (!) 80/56  Pulse: 87 86 87 98  Resp: 16     Temp: 98.3 F (36.8 C)     TempSrc: Oral     SpO2: 99% 100% 100% 100%  Weight:      Height:          Data Reviewed:  Basic Metabolic Panel: Recent Labs  Lab 08/25/22 2137 08/25/22 2145 08/26/22 0814 08/27/22 0930  NA 130* 132*  131* 132* 131*  K 4.0 4.2  4.2 4.0 4.1  CL 97* 99 100 102  CO2 23  --  24 21*  GLUCOSE 256* 253* 219* 283*  BUN 34* 33* 26* 20  CREATININE 1.79* 1.80* 1.13 1.21  CALCIUM 9.1  --  8.6* 8.4*  MG  --   --  2.1  --     CBC: Recent Labs  Lab 08/25/22 2137 08/25/22 2145 08/26/22 9562  08/27/22 0930  WBC 11.5*  --  7.8 5.6  NEUTROABS 6.7  --  3.9  --   HGB 12.6* 12.2*  12.6* 11.9* 11.7*  HCT 36.5* 36.0*  37.0* 34.4* 35.7*  MCV 78.0*  --  76.6* 80.2  PLT 466*  --  373 321    LFT Recent Labs  Lab 08/25/22 2137  AST 12*  ALT 13  ALKPHOS 83  BILITOT 0.4  PROT 6.4*  ALBUMIN 3.2*     Antibiotics: Anti-infectives (From admission, onward)    Start     Dose/Rate Route Frequency Ordered Stop   08/26/22 2200  hydroxychloroquine (PLAQUENIL) tablet 200 mg       Note to Pharmacy: Wife called and spoke with nurse.  This is a medication the patient takes at home that was not entered  at admission. Dr. Sibyl Parr informed.     200 mg Oral 2 times daily 08/26/22 1510          DVT prophylaxis: TED hose  Code Status: Full code  Family Communication: Discussed with sister on phone     CONSULTS    Subjective   Systolic blood pressure still drops to 80s on standing.  Denies chest pain or shortness of breath   Objective    Physical Examination:   General-appears in no acute distress Heart-S1-S2, regular, no murmur auscultated Lungs-clear to auscultation bilaterally, no wheezing or crackles auscultated Abdomen-soft, nontender, no organomegaly Extremities-no edema in the lower extremities Neuro-alert, oriented x3, no focal deficit noted   Status is: Inpatient:          Meredeth Ide   [facility] If 7PM-7AM, please contact night-coverage at [email or web address], Office  [phone]   08/27/2022, 11:23 AM  LOS: 0 days

## 2022-08-27 NOTE — TOC Progression Note (Addendum)
Transition of Care [facility]) - Progression Note    Patient Details  Name: Vincent [name] MRN: [id number] Date of Birth: January 11, 1966  [facility] CM/SW Contact  Ronny Bacon, RN Phone Number: 08/27/2022, 3:10 PM  Clinical Narrative:   Spoke with patient and sister [name] by phone. Patient lives with sister and she assists with transportation needs. Sister confirmed that patient has Rollator and cane at home. Patient has PCP Dr Hal Hope.  No preference for [facility] PT OT, call out to Amy with [facility] can accept patient. Patient will need HH PT OT with face to face orders.         Expected Discharge Plan and Services                                               Social Determinants of Health (SDOH) Interventions SDOH Screenings   Food Insecurity: No Food Insecurity (07/18/2022)  Housing: Low Risk  (07/18/2022)  Transportation Needs: No Transportation Needs (07/18/2022)  Utilities: Not At Risk (07/18/2022)  Alcohol Screen: Low Risk  (07/31/2021)  Tobacco Use: High Risk (08/11/2022)    Readmission Risk Interventions    05/26/2022    1:26 PM  Readmission Risk Prevention Plan  PCP or Specialist Appt within 3-5 Days Complete  HRI or Home Care Consult Complete  Social Work Consult for Recovery Care Planning/Counseling Complete  Palliative Care Screening Not Applicable  Medication Review Oceanographer) Complete

## 2022-08-27 NOTE — TOC Initial Note (Signed)
Transition of Care [facility]) - Initial/Assessment Note    Patient Details  Name: Vincent [name] MRN: [id number] Date of Birth: 12-15-1965  Transition of Care [facility]) CM/SW Contact:    Carmina Miller, LCSWA Phone Number: 08/27/2022, 12:25 PM  Clinical Narrative:                  CSW spoke with pt via phone concerning SNF recommendation, pt declining SNF at this time, states he would be interested in having PT come into the home. Pt states he lives with his sister, [name]. RNCM made aware.         Patient Goals and CMS Choice            Expected Discharge Plan and Services                                              Prior Living Arrangements/Services                       Activities of Daily Living      Permission Sought/Granted                  Emotional Assessment              Admission diagnosis:  Encephalopathy [G93.40] AKI (acute kidney injury) (HCC) [N17.9] Patient Active Problem List   Diagnosis Date Noted   Syncope 07/19/2022   Syncope and collapse 07/18/2022   Peripheral neuropathy in hands 06/08/2022   Hyperosmolar hyperglycemic state (HHS) (HCC) 05/24/2022   Essential hypertension 05/23/2022   BPH (benign prostatic hyperplasia) 05/23/2022   Diabetic hyperosmolar non-ketotic state (HCC) 04/05/2022   AKI (acute kidney injury) (HCC) 09/04/2021   Hypotension 09/03/2021   History of seizure 08/02/2021   Acute metabolic encephalopathy 07/28/2021   Suicide attempt by drug overdose (HCC) 07/28/2021   Cerebral thrombosis with cerebral infarction 11/16/2020   Stroke-like symptoms 11/14/2020   Hyperkalemia 11/14/2020   Ischemic stroke (HCC) 06/10/2020   Hyperglycemia    Gangrene of finger of left hand (HCC)    Pain due to onychomycosis of toenails of both feet 01/28/2020   Hyperglycemia due to type 2 diabetes mellitus (HCC) 01/07/2020   Resistance to insulin 01/07/2020   Right leg weakness 04/24/2019   Right leg pain  04/24/2019   Gait abnormality 04/24/2019   TIA (transient ischemic attack) 10/20/2018   DM2 (diabetes mellitus, type 2) (HCC) 10/19/2018   Left sided numbness 10/19/2018   Diabetic autonomic neuropathy associated with type 2 diabetes mellitus (HCC) 02/01/2018   DM type 2 with diabetic peripheral neuropathy (HCC) 02/01/2018   Dyslipidemia 01/31/2018   Coronary artery disease involving native coronary artery of native heart without angina pectoris 01/31/2018   Chest pain 01/01/2018   Breakthrough seizure (HCC) 12/19/2016   Essential tremor 12/19/2016   Chronic post-traumatic stress disorder (PTSD) 06/16/2016   Tremor 06/16/2016   Tobacco abuse 05/05/2016   Altered mental status 05/28/2015   Acute bronchitis 05/28/2015   Type 2 diabetes mellitus with hyperglycemia (HCC) 05/28/2015   Hypothyroidism 05/28/2015   Bipolar 2 disorder (HCC) 05/28/2015   History of rheumatoid arthritis 05/28/2015   Chronic pain 05/28/2015   Bipolar II disorder, most recent episode major depressive (HCC) 03/18/2015   Carpal tunnel syndrome on left 02/10/2015   Carpal tunnel syndrome on right 02/10/2015   Diabetes (HCC) 01/23/2015  Obesity (BMI 30-39.9) 03/06/2014   Acute encephalopathy 03/05/2014   Hyperlipidemia 03/05/2014   Anemia, normocytic normochromic 03/05/2014   Altered mental state    Helicobacter pylori (H. pylori) infection 11/20/2012   Protein-calorie malnutrition, severe (HCC) 10/09/2012   Dysphagia 08/19/2012   Gastroparesis 08/19/2012   Bladder tumor 08/08/2012   Biliary dyskinesia 11/02/2010   PCP:  Dois Davenport, MD Pharmacy:   [id number] - [place], [place] - [address] [place] [address] Phone: [phone] Fax: [phone]  [id number] - [address] [place]), [place] - [address] [place] [address] Phone: [phone] Fax: [phone]     Social Determinants of Health  (SDOH) Social History: SDOH Screenings   Food Insecurity: No Food Insecurity (07/18/2022)  Housing: Low Risk  (07/18/2022)  Transportation Needs: No Transportation Needs (07/18/2022)  Utilities: Not At Risk (07/18/2022)  Alcohol Screen: Low Risk  (07/31/2021)  Tobacco Use: High Risk (08/11/2022)   SDOH Interventions:     Readmission Risk Interventions    05/26/2022    1:26 PM  Readmission Risk Prevention Plan  PCP or Specialist Appt within 3-5 Days Complete  HRI or Home Care Consult Complete  Social Work Consult for Recovery Care Planning/Counseling Complete  Palliative Care Screening Not Applicable  Medication Review Oceanographer) Complete

## 2022-08-28 ENCOUNTER — Encounter (HOSPITAL_COMMUNITY): Payer: Self-pay | Admitting: Family Medicine

## 2022-08-28 ENCOUNTER — Encounter: Payer: Medicare HMO | Admitting: Occupational Therapy

## 2022-08-28 DIAGNOSIS — E039 Hypothyroidism, unspecified: Secondary | ICD-10-CM

## 2022-08-28 DIAGNOSIS — Z8739 Personal history of other diseases of the musculoskeletal system and connective tissue: Secondary | ICD-10-CM

## 2022-08-28 DIAGNOSIS — E1165 Type 2 diabetes mellitus with hyperglycemia: Secondary | ICD-10-CM

## 2022-08-28 DIAGNOSIS — G9340 Encephalopathy, unspecified: Secondary | ICD-10-CM | POA: Diagnosis not present

## 2022-08-28 DIAGNOSIS — Z794 Long term (current) use of insulin: Secondary | ICD-10-CM

## 2022-08-28 DIAGNOSIS — I10 Essential (primary) hypertension: Secondary | ICD-10-CM | POA: Diagnosis not present

## 2022-08-28 DIAGNOSIS — F3181 Bipolar II disorder: Secondary | ICD-10-CM | POA: Diagnosis not present

## 2022-08-28 DIAGNOSIS — N179 Acute kidney failure, unspecified: Secondary | ICD-10-CM | POA: Diagnosis not present

## 2022-08-28 LAB — CBC WITH DIFFERENTIAL/PLATELET
Abs Immature Granulocytes: 0.05 10*3/uL (ref 0.00–0.07)
Basophils Absolute: 0.1 10*3/uL (ref 0.0–0.1)
Basophils Relative: 2 %
Eosinophils Absolute: 0.2 10*3/uL (ref 0.0–0.5)
Eosinophils Relative: 3 %
HCT: 35.5 % — ABNORMAL LOW (ref 39.0–52.0)
Hemoglobin: 11.7 g/dL — ABNORMAL LOW (ref 13.0–17.0)
Immature Granulocytes: 1 %
Lymphocytes Relative: 39 %
Lymphs Abs: 2.5 10*3/uL (ref 0.7–4.0)
MCH: 26.6 pg (ref 26.0–34.0)
MCHC: 33 g/dL (ref 30.0–36.0)
MCV: 80.7 fL (ref 80.0–100.0)
Monocytes Absolute: 0.5 10*3/uL (ref 0.1–1.0)
Monocytes Relative: 7 %
Neutro Abs: 3 10*3/uL (ref 1.7–7.7)
Neutrophils Relative %: 48 %
Platelets: 303 10*3/uL (ref 150–400)
RBC: 4.4 MIL/uL (ref 4.22–5.81)
RDW: 12.8 % (ref 11.5–15.5)
WBC: 6.3 10*3/uL (ref 4.0–10.5)
nRBC: 0 % (ref 0.0–0.2)

## 2022-08-28 LAB — PHOSPHORUS: Phosphorus: 5.1 mg/dL — ABNORMAL HIGH (ref 2.5–4.6)

## 2022-08-28 LAB — COMPREHENSIVE METABOLIC PANEL
ALT: 13 U/L (ref 0–44)
AST: 14 U/L — ABNORMAL LOW (ref 15–41)
Albumin: 2.7 g/dL — ABNORMAL LOW (ref 3.5–5.0)
Alkaline Phosphatase: 59 U/L (ref 38–126)
Anion gap: 14 (ref 5–15)
BUN: 30 mg/dL — ABNORMAL HIGH (ref 6–20)
CO2: 20 mmol/L — ABNORMAL LOW (ref 22–32)
Calcium: 8.9 mg/dL (ref 8.9–10.3)
Chloride: 104 mmol/L (ref 98–111)
Creatinine, Ser: 1.25 mg/dL — ABNORMAL HIGH (ref 0.61–1.24)
GFR, Estimated: >60 mL/min (ref >60)
Glucose, Bld: 199 mg/dL — ABNORMAL HIGH (ref 70–99)
Potassium: 4.4 mmol/L (ref 3.5–5.1)
Sodium: 138 mmol/L (ref 135–145)
Total Bilirubin: 0.3 mg/dL (ref 0.3–1.2)
Total Protein: 5.6 g/dL — ABNORMAL LOW (ref 6.5–8.1)

## 2022-08-28 LAB — GLUCOSE, CAPILLARY
Glucose-Capillary: 168 mg/dL — ABNORMAL HIGH (ref 70–99)
Glucose-Capillary: 166 mg/dL — ABNORMAL HIGH (ref 70–99)
Glucose-Capillary: 141 mg/dL — ABNORMAL HIGH (ref 70–99)

## 2022-08-28 LAB — CULTURE, BLOOD (ROUTINE X 2): Culture: NO GROWTH

## 2022-08-28 LAB — CORTISOL: Cortisol, Plasma: 4.2 ug/dL

## 2022-08-28 LAB — MAGNESIUM: Magnesium: 2 mg/dL (ref 1.7–2.4)

## 2022-08-28 MED ORDER — AMOXICILLIN-POT CLAVULANATE 875-125 MG PO TABS
1.0000 | ORAL_TABLET | Freq: Two times a day (BID) | ORAL | Status: DC
  Administered 2022-08-28 – 2022-08-29 (×2): 1 via ORAL
  Filled 2022-08-28 (×2): qty 1

## 2022-08-28 MED ORDER — SODIUM CHLORIDE 0.9 % IV BOLUS
1000.0000 mL | Freq: Once | INTRAVENOUS | Status: AC
  Administered 2022-08-28: 1000 mL via INTRAVENOUS

## 2022-08-28 NOTE — Progress Notes (Signed)
Patient refused orthostatic vitals. 

## 2022-08-28 NOTE — Inpatient Diabetes Management (Signed)
Inpatient Diabetes Program Recommendations  AACE/ADA: New Consensus Statement on Inpatient Glycemic Control (2015)  Target Ranges:  Prepandial:   less than 140 mg/dL      Peak postprandial:   less than 180 mg/dL (1-2 hours)      Critically ill patients:  140 - 180 mg/dL   Lab Results  Component Value Date   GLUCAP 141 (H) 08/28/2022   HGBA1C 14.1 (H) 08/26/2022    Review of Glycemic Control  Diabetes history: DM2 Outpatient Diabetes medications: Toujeo 30 units BID, Humalog 20 units TID with meals Current orders for Inpatient glycemic control: Semglee 80 units every day, Novolog 0-15 TID with meals  HgbA1C - 14.1%  Inpatient Diabetes Program Recommendations:    Divide Semglee dose - 40 units BID  Add Novolog 6 units TID with meals if eating > 50%  Spoke with pt at bedside regarding diabetes control at home and HgbA1C of 14.1%. Has Libre to monitor blood sugars and states "they are all over the place." Pt follows up with PCP and has appt next month with Endocrinologist. Reviewed with pt the importance of taking his insulin as prescribed to prevent long-term devastating complications and also the short-term complications like dehydration and possible DKA. Reviewed goal CBGs and goal A1c for home.   Pt appreciative of visit.   Follow closely.  Thank you. Ailene Ards, RD, LDN, CDCES Inpatient Diabetes Coordinator [phone]

## 2022-08-28 NOTE — Consult Note (Addendum)
Vincent [name] [facility] Jan 30, 1966  [id number].    Requesting MD: Dr. Marguerita Merles Chief Complaint/Reason for Consult: buttock abscess  HPI:  This is a 57 yo white male with a history of multiple medical problems including diabetes who had a buttock abscess over a week ago.  He went to [facility] ED and had an I&D completed.  He was later admitted to [facility] secondary to encephalopathy.  This is improving, but he still has some erythema and purulent drainage.  We have been asked to see for further evaluation.  ROS: ROS: Please see HPI  Family History  Problem Relation Age of Onset   Diabetes Mother    Diabetes Father    Hypertension Father    Heart attack Father 54       died age 4   Alcohol abuse Father    Colon cancer Neg Hx    Esophageal cancer Neg Hx    Stomach cancer Neg Hx    Rectal cancer Neg Hx     Past Medical History:  Diagnosis Date   Anxiety    Benign essential tremor    Bipolar 2 disorder (HCC)    followed by [facility]--- dr s. Lolly Mustache   Bladder cancer [facility])    recurrent   CAD (coronary artery disease)    cardiac cath 2003  and 2011 both showed normal coronary arteries w/ preserved lvf;  Non obstructive on CTA Oct 2019.    Chronic pain syndrome    back---- [facility] [place]   Cold extremities    BLE   COPD (chronic obstructive pulmonary disease) (HCC)    DDD (degenerative disc disease), lumbar    Diabetic peripheral neuropathy (HCC)    Gastroparesis    followed by dr Marina Goodell   GERD (gastroesophageal reflux disease)    Hiatal hernia    History of bladder cancer urologist-  previously dr Ronal Fear;  now dr gay   papillay TCC (Ta G1)  s/p TURBT and chemo instillation 2014   History of chest pain 12/2017   heart cath normal   History of encephalopathy 05/27/2015   admission w/ acute encephalopathy thought to be secondary to pain meds and COPD   History of gastric ulcer    History of Helicobacter pylori infection    History of kidney stones    History of  TIA (transient ischemic attack) 2008  and 10-19-2018    no residual's   History of traumatic head injury 2010   w/ LOC  per pt needed stitches, hit in head with a mower blade   Hyperlipidemia    Hypertension    Hypogonadism male    s/p  bilateral orchiectomy   Hypothyroidism    Insomnia    Mild obstructive sleep apnea    study in epic 12-04-2016, no cpap   PTSD (post-traumatic stress disorder)    chronic   PTSD (post-traumatic stress disorder)    RA (rheumatoid arthritis) (HCC)    followed by [facility]   Seizures, transient [facility]) neurologist-  dr Terrace Arabia--  differential dx complex partial seizure .vs.  mood disorder .vs.  pseudoseizure--  negative EEG's   confusion episodes and staring spells since 11/ 2015   (03-26-2020 per pt wife last seizure 10 /2021)   Transient confusion NEUOROLOGIST-  DR Terrace Arabia   Episodes since 11/ 2015--  neurologist dx  differential complex partial seizure  .vs. mood disorder . vs. pseudoseizure   Type 2 diabetes mellitus treated with insulin (HCC)  endocrinologist--- dr Everardo All---  (03-26-2020 pt does not check blood sugar at home)    Past Surgical History:  Procedure Laterality Date   AMPUTATION Left 04/28/2020   Procedure: LEFT LITTLE FINGER AMPUTATION;  Surgeon: Nadara Mustard, MD;  Location: [facility] OR;  Service: Orthopedics;  Laterality: Left;   CARDIAC CATHETERIZATION  12-27-2001  DR Jacinto Halim  &  05-26-2009  DR Eldridge Dace   RESULTS FOR BOTH ARE NORMAL CORONARIES AND PERSERVED LVF/ EF 60%   CARPAL TUNNEL RELEASE Bilateral right 09-16-2003;  left ?   CARPAL TUNNEL RELEASE Left 02/25/2015   Procedure: LEFT CARPAL TUNNEL RELEASE;  Surgeon: Betha Loa, MD;  Location: [facility];  Service: Orthopedics;  Laterality: Left;   CYSTOSCOPY N/A 10/10/2012   Procedure: CYSTOSCOPY CLOT EVACUATION FULGERATION OF BLEEDERS ;  Surgeon: Garnett Farm, MD;  Location: [facility];  Service: Urology;  Laterality: N/A;   CYSTOSCOPY WITH BIOPSY  N/A 11/26/2015   Procedure: CYSTOSCOPY WITH BIOPSY AND FULGURATION;  Surgeon: Ihor Gully, MD;  [facility];  Service: Urology;  Laterality: N/A;   ESOPHAGOGASTRODUODENOSCOPY  2014   LAPAROSCOPIC CHOLECYSTECTOMY  11-17-2010   ORCHIECTOMY Right 02/21/2016   Procedure: SCROTAL ORCHIECTOMY with TESTICULAR PROSTHESIS IMPLANT;  Surgeon: Ihor Gully, MD;  Location: [facility];  Service: Urology;  Laterality: Right;   ORCHIECTOMY Left 09/02/2018   Procedure: ORCHIECTOMY;  Surgeon: Ihor Gully, MD;  Location: [facility];  Service: Urology;  Laterality: Left;   ROTATOR CUFF REPAIR Right 12/2004   TRANSURETHRAL RESECTION OF BLADDER TUMOR N/A 08/09/2012   Procedure: TRANSURETHRAL RESECTION OF BLADDER TUMOR (TURBT) WITH GYRUS WITH MITOMYCIN C;  Surgeon: Garnett Farm, MD;  Location: [facility];  Service: Urology;  Laterality: N/A;   TRANSURETHRAL RESECTION OF BLADDER TUMOR N/A 03/29/2020   Procedure: TRANSURETHRAL RESECTION OF BLADDER TUMOR (TURBT) and post-op instillation of gemcitabine;  Surgeon: [name], MD;  Location: [facility];  Service: Urology;  Laterality: N/A;   TRANSURETHRAL RESECTION OF BLADDER TUMOR WITH GYRUS (TURBT-GYRUS) N/A 02/27/2014   Procedure: TRANSURETHRAL RESECTION OF BLADDER TUMOR WITH GYRUS (TURBT-GYRUS);  Surgeon: Garnett Farm, MD;  Location: [facility];  Service: Urology;  Laterality: N/A;    Social History:  reports that he has been smoking cigarettes. He has a 57.00 pack-year smoking history. He has never used smokeless tobacco. He reports that he does not drink alcohol and does not use drugs.  Allergies:  Allergies  Allergen Reactions   Celecoxib Anaphylaxis, Swelling and Rash    Tongue swells   Hydrocodone Rash and Other (See Comments)    "blisters developed on arms"    Sulfa Antibiotics Rash   Sulfacetamide Sodium Rash    Medications Prior to Admission   Medication Sig Dispense Refill   acetaminophen (TYLENOL) 500 MG tablet Take 1,000 mg by mouth every 4 (four) hours as needed for moderate pain or headache.     baclofen (LIORESAL) 10 MG tablet Take 10 mg by mouth 3 (three) times daily as needed for muscle spasms.     clindamycin (CLEOCIN) 300 MG capsule Take 300 mg by mouth 3 (three) times daily.     clopidogrel (PLAVIX) 75 MG tablet Take 75 mg by mouth daily. Wife called and spoke with nurse.  This is a medication the patient takes at home that was not entered at admission. Dr. Sibyl Parr informed.     dicyclomine (BENTYL) 20 MG tablet Take 1 tablet (20  mg total) by mouth 2 (two) times daily as needed (abdominal cramping). 20 tablet 0   DULoxetine (CYMBALTA) 60 MG capsule Take 60 mg by mouth daily. Wife called and spoke with nurse.  This is a medication the patient takes at home that was not entered at admission. Dr. Sibyl Parr informed.     famotidine (PEPCID) 20 MG tablet Take 1 tablet (20 mg total) by mouth 2 (two) times daily. 30 tablet 0   HUMALOG KWIKPEN 200 UNIT/ML KwikPen Inject 25 Units into the skin in the morning, at noon, and at bedtime.     hydroxychloroquine (PLAQUENIL) 200 MG tablet Take 200 mg by mouth 2 (two) times daily. Wife called and spoke with nurse.  This is a medication the patient takes at home that was not entered at admission. Dr. Sibyl Parr informed.     hydrOXYzine (ATARAX) 25 MG tablet Take 25 mg by mouth at bedtime as needed for anxiety or itching.     insulin glargine, 2 Unit Dial, (TOUJEO MAX SOLOSTAR) 300 UNIT/ML Solostar Pen Inject 100 Units into the skin daily. (Patient taking differently: Inject 80 Units into the skin daily.) 30 mL 0   lamoTRIgine (LAMICTAL) 150 MG tablet Take 150 mg by mouth 2 (two) times daily. Wife called and spoke with nurse.  This is a medication the patient takes at home that was not entered at admission. Dr. Sibyl Parr informed.     losartan (COZAAR) 25 MG tablet Take 25 mg by mouth daily.     lurasidone  (LATUDA) 20 MG TABS tablet Take 40 mg by mouth at bedtime.     lurasidone (LATUDA) 40 MG TABS tablet Take 40 mg by mouth every evening. Wife called and spoke with nurse.  This is a medication the patient takes at home that was not entered at admission. Dr. Sibyl Parr informed.     omeprazole (PRILOSEC) 40 MG capsule Take 40 mg by mouth daily.     pregabalin (LYRICA) 200 MG capsule Take 200 mg by mouth 3 (three) times daily.     rOPINIRole (REQUIP) 0.5 MG tablet Take 0.5 mg by mouth at bedtime.     tamsulosin (FLOMAX) 0.4 MG CAPS capsule Take 0.4 mg by mouth at bedtime.     venlafaxine XR (EFFEXOR-XR) 150 MG 24 hr capsule Take 150 mg by mouth daily with breakfast. Wife called and spoke with nurse.  This is a medication the patient takes at home that was not entered at admission. Dr. Sibyl Parr informed.     ibuprofen (ADVIL) 200 MG tablet Take 600 mg by mouth 2 (two) times daily. PRN     rosuvastatin (CRESTOR) 10 MG tablet Take 1 tablet (10 mg total) by mouth daily. (Patient taking differently: Take 10 mg by mouth at bedtime.) 30 tablet 0     Physical Exam: Blood pressure (!) 156/110, pulse 89, temperature 98.6 F (37 C), temperature source Oral, resp. rate 19, height 6' (1.829 m), weight 80.7 kg, SpO2 100 %. General: pleasant, WD, WN white male who is laying in bed in NAD HEENT: head is normocephalic, atraumatic.  Sclera are noninjected.  PERRL.  Ears and nose without any masses or lesions.  Mouth is pink and moist Lungs: Respiratory effort nonlabored Skin: warm and dry with no masses, lesions, or rashes.  He does have a small wound noted over his sacrum.  He has an area of fluctuance noted on the right gluteus with some erythema.  Central opening draining thin purulent drainage.  This was explored and  probed with a Q tip.  Approximately 10cc more of purulent drainage was evacuated.   Psych: A&Ox3 with an appropriate affect.   Results for orders placed or performed during the hospital encounter of  08/25/22 (from the past 48 hour(s))  Glucose, capillary     Status: Abnormal   Collection Time: 08/26/22  3:55 PM  Result Value Ref Range   Glucose-Capillary 174 (H) 70 - 99 mg/dL    Comment: Glucose reference range applies only to samples taken after fasting for at least 8 hours.  Glucose, capillary     Status: Abnormal   Collection Time: 08/27/22  7:15 AM  Result Value Ref Range   Glucose-Capillary 258 (H) 70 - 99 mg/dL    Comment: Glucose reference range applies only to samples taken after fasting for at least 8 hours.  CBC     Status: Abnormal   Collection Time: 08/27/22  9:30 AM  Result Value Ref Range   WBC 5.6 4.0 - 10.5 K/uL   RBC 4.45 4.22 - 5.81 MIL/uL   Hemoglobin 11.7 (L) 13.0 - 17.0 g/dL   HCT 21.3 (L) 08.6 - 57.8 %   MCV 80.2 80.0 - 100.0 fL   MCH 26.3 26.0 - 34.0 pg   MCHC 32.8 30.0 - 36.0 g/dL   RDW 46.9 62.9 - 52.8 %   Platelets 321 150 - 400 K/uL   nRBC 0.0 0.0 - 0.2 %    Comment: Performed at [facility], [address] [place], [place] [address]  Basic metabolic panel     Status: Abnormal   Collection Time: 08/27/22  9:30 AM  Result Value Ref Range   Sodium 131 (L) 135 - 145 mmol/L   Potassium 4.1 3.5 - 5.1 mmol/L   Chloride 102 98 - 111 mmol/L   CO2 21 (L) 22 - 32 mmol/L   Glucose, Bld 283 (H) 70 - 99 mg/dL    Comment: Glucose reference range applies only to samples taken after fasting for at least 8 hours.   BUN 20 6 - 20 mg/dL   Creatinine, Ser 4.01 0.61 - 1.24 mg/dL   Calcium 8.4 (L) 8.9 - 10.3 mg/dL   GFR, Estimated >02 >72 mL/min    Comment: (NOTE) Calculated using the CKD-EPI Creatinine Equation (2021)    Anion gap 8 5 - 15    Comment: Performed at [facility], [address] [place], [place] [address]  Glucose, capillary     Status: Abnormal   Collection Time: 08/27/22 12:20 PM  Result Value Ref Range   Glucose-Capillary 200 (H) 70 - 99 mg/dL    Comment: Glucose reference range applies only to samples taken after fasting for  at least 8 hours.  Glucose, capillary     Status: Abnormal   Collection Time: 08/27/22  3:01 PM  Result Value Ref Range   Glucose-Capillary 246 (H) 70 - 99 mg/dL    Comment: Glucose reference range applies only to samples taken after fasting for at least 8 hours.  Cortisol     Status: None   Collection Time: 08/28/22  3:20 AM  Result Value Ref Range   Cortisol, Plasma 4.2 ug/dL    Comment: (NOTE) AM    6.7 - 22.6 ug/dL PM   <40.3       ug/dL Performed at [facility], [address] [place], [place] [address]   Glucose, capillary     Status: Abnormal   Collection Time: 08/28/22  8:39  AM  Result Value Ref Range   Glucose-Capillary 168 (H) 70 - 99 mg/dL    Comment: Glucose reference range applies only to samples taken after fasting for at least 8 hours.  CBC with Differential/Platelet     Status: Abnormal   Collection Time: 08/28/22 10:25 AM  Result Value Ref Range   WBC 6.3 4.0 - 10.5 K/uL   RBC 4.40 4.22 - 5.81 MIL/uL   Hemoglobin 11.7 (L) 13.0 - 17.0 g/dL   HCT 16.1 (L) 09.6 - 04.5 %   MCV 80.7 80.0 - 100.0 fL   MCH 26.6 26.0 - 34.0 pg   MCHC 33.0 30.0 - 36.0 g/dL   RDW 40.9 81.1 - 91.4 %   Platelets 303 150 - 400 K/uL   nRBC 0.0 0.0 - 0.2 %   Neutrophils Relative % 48 %   Neutro Abs 3.0 1.7 - 7.7 K/uL   Lymphocytes Relative 39 %   Lymphs Abs 2.5 0.7 - 4.0 K/uL   Monocytes Relative 7 %   Monocytes Absolute 0.5 0.1 - 1.0 K/uL   Eosinophils Relative 3 %   Eosinophils Absolute 0.2 0.0 - 0.5 K/uL   Basophils Relative 2 %   Basophils Absolute 0.1 0.0 - 0.1 K/uL   Immature Granulocytes 1 %   Abs Immature Granulocytes 0.05 0.00 - 0.07 K/uL    Comment: Performed at [facility], [address], [place], [place] [address]  Comprehensive metabolic panel     Status: Abnormal   Collection Time: 08/28/22 10:25 AM  Result Value Ref Range   Sodium 138 135 - 145 mmol/L    Comment: DELTA CHECK NOTED RESULTS VERIFIED BY REPEAT TESTING    Potassium 4.4 3.5 - 5.1  mmol/L   Chloride 104 98 - 111 mmol/L   CO2 20 (L) 22 - 32 mmol/L   Glucose, Bld 199 (H) 70 - 99 mg/dL    Comment: Glucose reference range applies only to samples taken after fasting for at least 8 hours.   BUN 30 (H) 6 - 20 mg/dL   Creatinine, Ser 6.21 (H) 0.61 - 1.24 mg/dL   Calcium 8.9 8.9 - 30.8 mg/dL   Total Protein 5.6 (L) 6.5 - 8.1 g/dL   Albumin 2.7 (L) 3.5 - 5.0 g/dL   AST 14 (L) 15 - 41 U/L   ALT 13 0 - 44 U/L   Alkaline Phosphatase 59 38 - 126 U/L   Total Bilirubin 0.3 0.3 - 1.2 mg/dL   GFR, Estimated >65 >78 mL/min    Comment: (NOTE) Calculated using the CKD-EPI Creatinine Equation (2021)    Anion gap 14 5 - 15    Comment: Performed at [facility], [address], [place], [place] [address]  Magnesium     Status: None   Collection Time: 08/28/22 10:25 AM  Result Value Ref Range   Magnesium 2.0 1.7 - 2.4 mg/dL    Comment: Performed at [facility], [address], [place], [place] [address]  Phosphorus     Status: Abnormal   Collection Time: 08/28/22 10:25 AM  Result Value Ref Range   Phosphorus 5.1 (H) 2.5 - 4.6 mg/dL    Comment: Performed at [facility], [address] [place], [place] [address]  Glucose, capillary     Status: Abnormal   Collection Time: 08/28/22 11:57 AM  Result Value Ref Range   Glucose-Capillary 166 (H) 70 - 99 mg/dL    Comment: Glucose reference range applies  only to samples taken after fasting for at least 8 hours.   MR BRAIN WO CONTRAST  Result Date: 08/27/2022 CLINICAL DATA:  Memory loss EXAM: MRI HEAD WITHOUT CONTRAST TECHNIQUE: Multiplanar, multiecho pulse sequences of the brain and surrounding structures were obtained without intravenous contrast. COMPARISON:  06/06/2021, correlation is also made with 07/18/2022 CT head FINDINGS: Brain: No restricted diffusion to suggest acute or subacute infarct. No acute hemorrhage, mass, mass effect, or midline shift. No hydrocephalus or extra-axial collection. Normal pituitary and  craniocervical junction. No hemosiderin deposition to suggest remote hemorrhage. Cerebral volume is within normal limits for age. Scattered T2 hyperintense signal in the periventricular white matter, likely the sequela of mild chronic small vessel ischemic disease. Vascular: Normal arterial flow voids. Skull and upper cervical spine: Normal marrow signal. Sinuses/Orbits: Clear paranasal sinuses. No acute finding in the orbits. Other: The mastoid air cells are well aerated. IMPRESSION: No acute intracranial process. Cerebral volume is within normal limits for age. Electronically Signed   By: Wiliam Ke M.D.   On: 08/27/2022 22:13      Assessment/Plan Right gluteal abscess, s/p I&D about a week ago The patient has been seen and examined.  He does have a residual left gluteal abscess.  This area was probed and further drained.  He does not need any further surgical intervention from our standpoint.  He may take sitz bathes or soak in the tub.  We will restart some abx therapy with augmentin for 5 days just to complete coverage.  I have relayed all of this to the primary service.  He is surgically stable for DC home when medically stable.   I reviewed hospitalist notes, last 24 h vitals and pain scores, last 48 h intake and output, last 24 h labs and trends, and last 24 h imaging results.  Letha Cape, [facility] 08/28/2022, 3:28 PM Please see Amion for pager number during day hours 7:00am-4:30pm or 7:00am -11:30am on weekends

## 2022-08-28 NOTE — Hospital Course (Addendum)
Patient is a 57 year old Caucasian male with past medical history significant for but not limited to uncontrolled Diabetes Mellitus Type 2 peripheral neuropathy, anxiety, chronic pain syndrome, bipolar 2 disorder, PTSD, rheumatoid arthritis, CAD, COPD, GERD, history of bladder cancer and gastroparesis as well as levels of comorbidities who was brought to the hospital with confusion and hypotension.  He was recently started on baclofen, Requip and hydroxyzine.  He also has a history of autonomic dysfunction causing orthostatic hypotension and has been having episodes of forgetfulness.  In the ED patient's effusion had resolved however he continues to still be extremely weak and was orthostatic today.  We ordered TED hose and abdominal binder and given him a fluid bolus.  He improved his orthostatics and he is now stable for discharge home and follow-up with PCP, neurology and psychiatry as an possibly surgery if necessary.  Assessment and Plan:  Acute Encephalopathy improved -Likely medication induced, resolved -Baclofen, Atarax, Requip on hold likely can resume but will hold and stop baclofen -Likely from underlying dementia -CT head from last admission was normal -Checked MRI brain and showed "No acute intracranial process. Cerebral volume is within normal limits for age." -Follow-up with neurology in outpatient setting   Bilateral lower extremity weakness/peripheral neuropathy -PT consulted and recommended Home Health -MRI of lumbar spine showed no spinal stenosis and largely normal MRI, mild central disc protrusion with annular fissure of L5-S1.  No neural impingement.  Mild left L3 neural foraminal stenosis due to disc bulging. -Continue Pregabalin 200 mgpo TID at discharge follow-up with neurology in outpatient setting   Orthostatic Hypotension, improved -Likely from autonomic dysfunction -Bilateral TED hose, abdominal binder ordered -Checked orthostatics every 4 hours but order  stopped -Consider adding midodrine versus pyridostigmine if blood pressure continues to be low despite TED hose and abdominal binder but Not on the Patient -Given a Bolus of 1 Liter today  -Checked cortisol level and was 4.2 -Orthostatics done and improved   Lactic Acidosis -Likely in setting of hypotension -Lactic Acid Level Trend: Recent Labs  Lab 08/25/22 2137 08/25/22 2333  LATICACIDVEN 2.2* 2.0*  -Improved and follow-up in outpatient setting  CKD stage IIIa/renal insufficiency Hyperphosphatemia, mild Metabolic Acidosis -BUN/Cr Trend: Recent Labs  Lab 08/25/22 2137 08/25/22 2145 08/26/22 0814 08/27/22 0930 08/28/22 1025 08/29/22 0045  BUN 34* 33* 26* 20 30* 36*  CREATININE 1.79* 1.80* 1.13 1.21 1.25* 1.50*  -Phos Level Trend: Recent Labs  Lab 08/28/22 1025 08/29/22 0045  PHOS 5.1* 5.8*  -Patient has a CO2 of 25, anion gap of 10, chloride level of 104 now -Avoid Nephrotoxic Medications, Contrast Dyes, Hypotension and Dehydration to Ensure Adequate Renal Perfusion and will need to Renally Adjust Meds -Continue to Monitor and Trend Renal Function carefully and repeat CMP in the AM     Uncontrolled Diabetes Mellitus Type 2 -Continue sliding scale insulin with NovoLog -Continue Semglee 80 units sq Daily and Moderate Novolog AC -Hemoglobin A1c was 14.1 -CBG Trend: Recent Labs  Lab 08/25/22 2137 08/25/22 2145 08/26/22 0338 08/26/22 0814 08/26/22 1133 08/27/22 0930 08/27/22 1220 08/28/22 1025 08/28/22 1157 08/29/22 0045 08/29/22 0732 08/29/22 1242  GLUCOSE 256* 253*  --  219*  --  283*  --  199*  --  142*  --   --   GLUCAP  --   --    < >  --    < >  --    < >  --    < >  --    < >  253*   < > = values in this interval not displayed.  -Follow-up with the outpatient diabetes education coordinator and continue Toujeo 40 mg twice daily and continue with Humalog 20 units 3 times daily with meals  Right Buttock Abscess -Had I and D completed at [facility] ED -Had  some erythema and purulent drainage -GI consulted and they probed the wound and found to have already had the loculations were screened and he continues to have purulent fluid and erythema -Surgery started patient on empiric Augmentin for 5 days and recommending possible wound check in the office as well -Continue with Augmentin for discharge and follow-up with general surgery as needed   BPH -Held Flomax and resume at discharge   Hyperlipidemia -Held Crestor but can resume   Bipolar Disorder -Continue Lurasidone 40 mg po at bedtime and Lamotrigine 150 mg po BID -Follow-up with psych in outpatient setting   Rheumatoid Arthritis -Continue Hydroxychloroquine 200 mg po BID -Follow-up with rheumatology in outpatient setting  Normocytic Anemia -Hgb/Hct Trend: Recent Labs  Lab 08/25/22 2137 08/25/22 2145 08/26/22 0814 08/27/22 0930 08/28/22 1025 08/29/22 0045  HGB 12.6* 12.2*  12.6* 11.9* 11.7* 11.7* 11.1*  HCT 36.5* 36.0*  37.0* 34.4* 35.7* 35.5* 33.6*  MCV 78.0*  --  76.6* 80.2 80.7 82.8  -Check Anemia Panel and it showed an iron level of 14, UIBC of 277, TIBC 323, saturation ratios of 14%, ferritin level 103, folate level 13.0 and vitamin B12 497 -Repeat CBC in the AM   History of Previous Stroke-like Symptoms -On Plavix placed be neuro dating back Sept 2022.  -Follow-up with neurology outpatient setting  Hypoalbuminemia -Patient's Albumin Trend: Recent Labs  Lab 08/25/22 2137 08/28/22 1025 08/29/22 0045  ALBUMIN 3.2* 2.7* 2.7*  -Continue to Monitor and Trend and repeat CMP in the AM

## 2022-08-28 NOTE — Progress Notes (Signed)
PROGRESS NOTE    DAJOHN ROSIE  WRU:[id number] DOB: [birth year] DOA: 08/25/2022 PCP: Dois Davenport, MD   Brief Narrative:  Patient is a 57 year old Caucasian male with past medical history significant for but not limited to uncontrolled Diabetes Mellitus Type 2 peripheral neuropathy, anxiety, chronic pain syndrome, bipolar 2 disorder, PTSD, rheumatoid arthritis, CAD, COPD, GERD, history of bladder cancer and gastroparesis as well as levels of comorbidities who was brought to the hospital with confusion and hypotension.  He was recently started on baclofen, Requip and hydroxyzine.  He also has a history of autonomic dysfunction causing orthostatic hypotension and has been having episodes of forgetfulness.  In the ED patient's effusion had resolved however he continues to still be extremely weak and was orthostatic today.  We ordered TED hose and abdominal binder and given him a fluid bolus.    Assessment and Plan:  Acute Encephalopathy -Likely medication induced, resolved -Baclofen, Atarax, Requip on hold -Likely from underlying dementia -CT head from last admission was normal -Checked MRI brain and showed "No acute intracranial process. Cerebral volume is within normal limits for age."   Bilateral lower extremity weakness/peripheral neuropathy -PT consulted and recommended Home Health -MRI of lumbar spine showed no spinal stenosis and largely normal MRI, mild central disc protrusion with annular fissure of L5-S1.  No neural impingement.  Mild left L3 neural foraminal stenosis due to disc bulging. -Continue Pregabalin 200 mgpo TID   Orthostatic Hypotension -Likely from autonomic dysfunction -Bilateral TED hose, abdominal binder ordered -Checked orthostatics every 4 hours but order stopped -Consider adding midodrine versus pyridostigmine if blood pressure continues to be low despite TED hose and abdominal binder but Not on the Patient -Given a Bolus of 1 Liter today  -Checked cortisol  level and was 4.2   Lactic Acidosis -Likely in setting of hypotension -Lactic Acid Level Trend: Recent Labs  Lab 08/25/22 2137 08/25/22 2333  LATICACIDVEN 2.2* 2.0*  -Improved and will check   Renal Insuffiencey Hyperphosphatemia Metabolic Acidosis -BUN/Cr Trend: Recent Labs  Lab 08/25/22 2137 08/25/22 2145 08/26/22 0814 08/27/22 0930 08/28/22 1025  BUN 34* 33* 26* 20 30*  CREATININE 1.79* 1.80* 1.13 1.21 1.25*  -Phos Level Trend: Recent Labs  Lab 08/28/22 1025  PHOS 5.1*  -Patient has a CO2 of 20, anion gap of 14, chloride level 104 -Avoid Nephrotoxic Medications, Contrast Dyes, Hypotension and Dehydration to Ensure Adequate Renal Perfusion and will need to Renally Adjust Meds -Continue to Monitor and Trend Renal Function carefully and repeat CMP in the AM     Uncontrolled Diabetes Mellitus Type 2 -Continue sliding scale insulin with NovoLog -Continue Semglee 80 units sq Daily and Moderate Novolog AC -Hemoglobin A1c was 14.1 -CBG Trend: Recent Labs  Lab 08/25/22 2137 08/25/22 2145 08/26/22 0338 08/26/22 0814 08/26/22 1133 08/27/22 0930 08/27/22 1220 08/28/22 1025 08/28/22 1157 08/28/22 1614  GLUCOSE 256* 253*  --  219*  --  283*  --  199*  --   --   GLUCAP  --   --    < >  --    < >  --    < >  --    < > 141*   < > = values in this interval not displayed.   Right Buttock Abscess -Had I and D completed at [facility] ED -Had some erythema and purulent drainage -GI consulted and they probed the wound and found to have already had the loculations were screened and he continues to have purulent fluid and erythema -  Surgery started patient on empiric Augmentin for 5 days and recommending possible wound check in the office as well   BPH -Held Flomax   Hyperlipidemia -Held Crestor   Bipolar Disorder -Continue Lurasidone 40 mg po at bedtime and Lamotrigine 150 mg po BID   Rheumatoid Arthritis -Continue Hydroxychloroquine 200 mg po BID  Normocytic  Anemia -Hgb/Hct Trend: Recent Labs  Lab 08/25/22 2137 08/25/22 2145 08/26/22 0814 08/27/22 0930 08/28/22 1025  HGB 12.6* 12.2*  12.6* 11.9* 11.7* 11.7*  HCT 36.5* 36.0*  37.0* 34.4* 35.7* 35.5*  MCV 78.0*  --  76.6* 80.2 80.7  -Check Anemia Panel in the AM -Repeat CBC in the AM   History of Previous Stroke-like Symptoms -On Plavix placed be neuro dating back Sept 2022.   Hypoalbuminemia -Patient's Albumin Trend: Recent Labs  Lab 08/25/22 2137 08/28/22 1025  ALBUMIN 3.2* 2.7*  -Continue to Monitor and Trend and repeat CMP in the AM   DVT prophylaxis: Place TED hose Start: 08/27/22 0904    Code Status: Full Code Family Communication: No family present at bedside  Disposition Plan:  Level of care: Med-Surg Status is: Observation The patient will require care spanning > 2 midnights and should be moved to inpatient because: Continues to be Orthostatic   Consultants:  General Surgery  Procedures:  As delineated as above  Antimicrobials:  Anti-infectives (From admission, onward)    Start     Dose/Rate Route Frequency Ordered Stop   08/28/22 2200  amoxicillin-clavulanate (AUGMENTIN) 875-125 MG per tablet 1 tablet        1 tablet Oral Every 12 hours 08/28/22 1543     08/26/22 2200  hydroxychloroquine (PLAQUENIL) tablet 200 mg       Note to Pharmacy: Wife called and spoke with nurse.  This is a medication the patient takes at home that was not entered at admission. Dr. Sibyl Parr informed.     200 mg Oral 2 times daily 08/26/22 1510         Subjective: Seen and examined at bedside thinks he is doing okay and asking if he can go home.  Subsequently his blood pressures dropped again with PT.  Complaining of some buttock drainage.  No chest pain or shortness of breath.  No other concerns or complaints at this time.  Objective: Vitals:   08/28/22 1104 08/28/22 1106 08/28/22 1107 08/28/22 1616  BP: 132/74 102/67 (!) 156/110 127/71  Pulse: 82 87 89 82  Resp: 19 17 19 19    Temp:    98.9 F (37.2 C)  TempSrc:    Oral  SpO2: 97% 99% 100% 97%  Weight:      Height:       No intake or output data in the 24 hours ending 08/28/22 1745 Filed Weights   08/25/22 2207  Weight: 80.7 kg   Examination: Physical Exam:  Constitutional: WN/WD Caucasian male in no acute distress Respiratory: Diminished to auscultation bilaterally, no wheezing, rales, rhonchi or crackles. Normal respiratory effort and patient is not tachypenic. No accessory muscle use.  Unlabored breathing Cardiovascular: RRR, no murmurs / rubs / gallops. S1 and S2 auscultated. No extremity edema.  Abdomen: Soft, non-tender, non-distended.  Bowel sounds positive.  GU: Deferred. Musculoskeletal: No clubbing / cyanosis of digits/nails. No joint deformity upper and lower extremities.  Skin: Has a buttock wound is draining and has some erythema Neurologic: CN 2-12 grossly intact with no focal deficits. Romberg sign and cerebellar reflexes not assessed.  Psychiatric: Normal judgment and insight. Alert and  oriented x 3. Normal mood and appropriate affect.   Data Reviewed: I have personally reviewed following labs and imaging studies  CBC: Recent Labs  Lab 08/25/22 2137 08/25/22 2145 08/26/22 0814 08/27/22 0930 08/28/22 1025  WBC 11.5*  --  7.8 5.6 6.3  NEUTROABS 6.7  --  3.9  --  3.0  HGB 12.6* 12.2*  12.6* 11.9* 11.7* 11.7*  HCT 36.5* 36.0*  37.0* 34.4* 35.7* 35.5*  MCV 78.0*  --  76.6* 80.2 80.7  PLT 466*  --  373 321 303   Basic Metabolic Panel: Recent Labs  Lab 08/25/22 2137 08/25/22 2145 08/26/22 0814 08/27/22 0930 08/28/22 1025  NA 130* 132*  131* 132* 131* 138  K 4.0 4.2  4.2 4.0 4.1 4.4  CL 97* 99 100 102 104  CO2 23  --  24 21* 20*  GLUCOSE 256* 253* 219* 283* 199*  BUN 34* 33* 26* 20 30*  CREATININE 1.79* 1.80* 1.13 1.21 1.25*  CALCIUM 9.1  --  8.6* 8.4* 8.9  MG  --   --  2.1  --  2.0  PHOS  --   --   --   --  5.1*   GFR: Estimated Creatinine Clearance: 73.3 mL/min  (A) (by C-G formula based on SCr of 1.25 mg/dL (H)). Liver Function Tests: Recent Labs  Lab 08/25/22 2137 08/28/22 1025  AST 12* 14*  ALT 13 13  ALKPHOS 83 59  BILITOT 0.4 0.3  PROT 6.4* 5.6*  ALBUMIN 3.2* 2.7*   Recent Labs  Lab 08/25/22 2137  LIPASE 54*   No results for input(s): "AMMONIA" in the last 168 hours. Coagulation Profile: Recent Labs  Lab 08/25/22 2137  INR 1.0   Cardiac Enzymes: No results for input(s): "CKTOTAL", "CKMB", "CKMBINDEX", "TROPONINI" in the last 168 hours. BNP (last 3 results) No results for input(s): "PROBNP" in the last 8760 hours. HbA1C: Recent Labs    08/26/22 0814  HGBA1C 14.1*   CBG: Recent Labs  Lab 08/27/22 1220 08/27/22 1501 08/28/22 0839 08/28/22 1157 08/28/22 1614  GLUCAP 200* 246* 168* 166* 141*   Lipid Profile: No results for input(s): "CHOL", "HDL", "LDLCALC", "TRIG", "CHOLHDL", "LDLDIRECT" in the last 72 hours. Thyroid Function Tests: No results for input(s): "TSH", "T4TOTAL", "FREET4", "T3FREE", "THYROIDAB" in the last 72 hours. Anemia Panel: Recent Labs    08/26/22 0319  VITAMINB12 618  FOLATE 10.2   Sepsis Labs: Recent Labs  Lab 08/25/22 2137 08/25/22 2333  LATICACIDVEN 2.2* 2.0*   Recent Results (from the past 240 hour(s))  Blood Culture (routine x 2)     Status: None (Preliminary result)   Collection Time: 08/25/22  9:37 PM   Specimen: BLOOD LEFT ARM  Result Value Ref Range Status   Specimen Description BLOOD LEFT ARM  Final   Special Requests   Final    BOTTLES DRAWN AEROBIC AND ANAEROBIC Blood Culture adequate volume   Culture   Final    NO GROWTH 3 DAYS Performed at [facility], [address], [place], [place] [address]    Report Status PENDING  Incomplete  Blood Culture (routine x 2)     Status: None (Preliminary result)   Collection Time: 08/25/22  9:37 PM   Specimen: BLOOD RIGHT ARM  Result Value Ref Range Status   Specimen Description BLOOD RIGHT ARM  Final   Special Requests    Final    BOTTLES DRAWN AEROBIC AND ANAEROBIC Blood Culture adequate volume   Culture   Final  NO GROWTH 3 DAYS Performed at [facility], [address], [place], [place] [address]    Report Status PENDING  Incomplete     Radiology Studies: MR BRAIN WO CONTRAST  Result Date: 08/27/2022 CLINICAL DATA:  Memory loss EXAM: MRI HEAD WITHOUT CONTRAST TECHNIQUE: Multiplanar, multiecho pulse sequences of the brain and surrounding structures were obtained without intravenous contrast. COMPARISON:  06/06/2021, correlation is also made with 07/18/2022 CT head FINDINGS: Brain: No restricted diffusion to suggest acute or subacute infarct. No acute hemorrhage, mass, mass effect, or midline shift. No hydrocephalus or extra-axial collection. Normal pituitary and craniocervical junction. No hemosiderin deposition to suggest remote hemorrhage. Cerebral volume is within normal limits for age. Scattered T2 hyperintense signal in the periventricular white matter, likely the sequela of mild chronic small vessel ischemic disease. Vascular: Normal arterial flow voids. Skull and upper cervical spine: Normal marrow signal. Sinuses/Orbits: Clear paranasal sinuses. No acute finding in the orbits. Other: The mastoid air cells are well aerated. IMPRESSION: No acute intracranial process. Cerebral volume is within normal limits for age. Electronically Signed   By: Wiliam Ke M.D.   On: 08/27/2022 22:13    Scheduled Meds:  amoxicillin-clavulanate  1 tablet Oral Q12H   clopidogrel  75 mg Oral Daily   DULoxetine  60 mg Oral Daily   famotidine  20 mg Oral BID   hydroxychloroquine  200 mg Oral BID   insulin aspart  0-15 Units Subcutaneous TID WC   insulin glargine-yfgn  80 Units Subcutaneous Daily   lamoTRIgine  150 mg Oral BID   lurasidone  40 mg Oral QHS   nicotine  21 mg Transdermal Daily   pregabalin  200 mg Oral TID   Continuous Infusions:  sodium chloride 10 mL/hr at 08/27/22 1600    LOS: 0 days    Marguerita Merles, DO [facility] Available via Epic secure chat 7am-7pm After these hours, please refer to coverage provider listed on [email or web address] 08/28/2022, 5:45 PM

## 2022-08-28 NOTE — Progress Notes (Signed)
Patient in bed, resting without any distress at this time. 

## 2022-08-28 NOTE — Progress Notes (Signed)
Physical Therapy Treatment Patient Details Name: Vincent [name] MRN: [id number] DOB: 31-Oct-1965 Today's Date: 08/28/2022   History of Present Illness 57 year old male who presents with confusion, weakness, and hypotension. AKI present on admission. PMHx: uncontrolled diabetes with A1c > 15, peripheral neuropathy, anxiety disorder, chronic pain syndrome, bipolar 2 disorder, PTSD, DM type 2,  RA, CAD, COPD, GERD, h/o bladder cancer and gastroparesis.    PT Comments    Progressed patient towards goals per plan of care. Progressed to ambulation in hallway with no physical assistance provided and RW support. Progressed to stair training with min guard for safety but patient able to navigate without physical assistance. Tolerated today's session with no additional report of symptoms. End of session included education of wear schedule with TED hose; advised pt to donn with mobility and remove prior to return to bed. Recommend next session to progress ambulation distance and increase steps for stair training. Anticipating patient to discharge to home with home health rehab in order to facilitate discharge and improvements in functional mobility.  BP monitored throughout session (Blood pressure, HR respectively):  Supine - 113/68, 82 bpm  Sitting - 91/61, 88 bpm  Standing @ 0 min - 94/58, 94 bpm  Standing @ 6 min - 96/57, 96 bpm       Recommendations for follow up therapy are one component of a multi-disciplinary discharge planning process, led by the attending physician.  Recommendations may be updated based on patient status, additional functional criteria and insurance authorization.  Follow Up Recommendations  Can patient physically be transported by private vehicle: No    Assistance Recommended at Discharge Intermittent Supervision/Assistance  Patient can return home with the following A little help with walking and/or transfers;A little help with bathing/dressing/bathroom;Assist for  transportation;Help with stairs or ramp for entrance;Assistance with cooking/housework   Equipment Recommendations  None recommended by PT    Recommendations for Other Services       Precautions / Restrictions Precautions Precautions: Fall Precaution Comments: Orthostatic - watch BP Restrictions Weight Bearing Restrictions: No     Mobility  Bed Mobility Overal bed mobility: Needs Assistance Bed Mobility: Rolling, Sidelying to Sit Rolling: Supervision Sidelying to sit: Min guard       General bed mobility comments: Min guard for safety on transition from sidelying to EOB; verbal cue for hand placement but patient able to push up into sitting without physical assistance    Transfers Overall transfer level: Needs assistance Equipment used: Rolling walker (2 wheels) Transfers: Sit to/from Stand Sit to Stand: Min guard           General transfer comment: Able to perform transfer without physical assistance; verbal cue for scooting EOB and foot placement.    Ambulation/Gait Ambulation/Gait assistance: Min guard Gait Distance (Feet): 100 Feet Assistive device: Rolling walker (2 wheels) Gait Pattern/deviations: Step-through pattern, Decreased step length - right, Decreased step length - left, Decreased stride length, Trunk flexed Gait velocity: Decreasd Gait velocity interpretation: <1.8 ft/sec, indicate of risk for recurrent falls Pre-gait activities: Marching at EOB; x8 bilateral legs; no additional symptoms General Gait Details: Patient walking near baseline velocity; able to progress distance without additional symptoms. Pt endorsed better performance when walking with rollator.   Stairs Stairs: Yes Stairs assistance: Min guard Stair Management: One rail Right, One rail Left, Forwards Number of Stairs: 2 General stair comments: Provided hand held assist to simulate cane; able to ascend without physical assistance with step to pattern.   Wheelchair Mobility     Modified  Rankin (Stroke Patients Only)       Balance Overall balance assessment: Needs assistance Sitting-balance support: Feet supported, No upper extremity supported Sitting balance-Leahy Scale: Fair Sitting balance - Comments: sitting EOB   Standing balance support: During functional activity, Reliant on assistive device for balance, Single extremity supported Standing balance-Leahy Scale: Poor Standing balance comment: Able to stand with single UE support on RW for blood pressure reading.                            Cognition Arousal/Alertness: Awake/alert Behavior During Therapy: WFL for tasks assessed/performed Overall Cognitive Status: Within Functional Limits for tasks assessed                                          Exercises General Exercises - Lower Extremity Hip Flexion/Marching: AROM, 10 reps, Standing    General Comments        Pertinent Vitals/Pain      Home Living                          Prior Function            PT Goals (current goals can now be found in the care plan section) Acute Rehab PT Goals Patient Stated Goal: Get BP under control PT Goal Formulation: With patient/family Time For Goal Achievement: 09/09/22 Potential to Achieve Goals: Good Progress towards PT goals: Progressing toward goals    Frequency    Min 3X/week      PT Plan Current plan remains appropriate    Co-evaluation              AM-PAC PT "6 Clicks" Mobility   Outcome Measure  Help needed turning from your back to your side while in a flat bed without using bedrails?: None Help needed moving from lying on your back to sitting on the side of a flat bed without using bedrails?: A Little Help needed moving to and from a bed to a chair (including a wheelchair)?: A Little Help needed standing up from a chair using your arms (e.g., wheelchair or bedside chair)?: A Little Help needed to walk in hospital room?: A  Little Help needed climbing 3-5 steps with a railing? : A Little 6 Click Score: 19    End of Session Equipment Utilized During Treatment: Gait belt Activity Tolerance: Patient tolerated treatment well (Orthostatic hypotension) Patient left: in bed;with call bell/phone within reach Nurse Communication: Mobility status PT Visit Diagnosis: Unsteadiness on feet (R26.81);Difficulty in walking, not elsewhere classified (R26.2)     Time: 0231-0250 PT Time Calculation (min) (ACUTE ONLY): 19 min  Charges:  $Gait Training: 8-22 mins                     Christene Lye, SPT Acute Rehabilitation Services [phone] Secure chat preferred     Christene Lye 08/28/2022, 4:15 PM

## 2022-08-28 NOTE — Plan of Care (Signed)
  Problem: Skin Integrity: Goal: Risk for impaired skin integrity will decrease Outcome: Progressing   Problem: Tissue Perfusion: Goal: Adequacy of tissue perfusion will improve Outcome: Progressing   Problem: Safety: Goal: Ability to remain free from injury will improve Outcome: Progressing

## 2022-08-29 DIAGNOSIS — E1122 Type 2 diabetes mellitus with diabetic chronic kidney disease: Secondary | ICD-10-CM | POA: Diagnosis present

## 2022-08-29 DIAGNOSIS — G9340 Encephalopathy, unspecified: Secondary | ICD-10-CM | POA: Diagnosis present

## 2022-08-29 DIAGNOSIS — G928 Other toxic encephalopathy: Secondary | ICD-10-CM | POA: Diagnosis present

## 2022-08-29 DIAGNOSIS — E039 Hypothyroidism, unspecified: Secondary | ICD-10-CM | POA: Diagnosis present

## 2022-08-29 DIAGNOSIS — E872 Acidosis, unspecified: Secondary | ICD-10-CM | POA: Diagnosis present

## 2022-08-29 DIAGNOSIS — N179 Acute kidney failure, unspecified: Secondary | ICD-10-CM | POA: Diagnosis not present

## 2022-08-29 DIAGNOSIS — E1165 Type 2 diabetes mellitus with hyperglycemia: Secondary | ICD-10-CM | POA: Diagnosis present

## 2022-08-29 DIAGNOSIS — Z720 Tobacco use: Secondary | ICD-10-CM

## 2022-08-29 DIAGNOSIS — L0231 Cutaneous abscess of buttock: Secondary | ICD-10-CM | POA: Diagnosis present

## 2022-08-29 DIAGNOSIS — R072 Precordial pain: Secondary | ICD-10-CM

## 2022-08-29 DIAGNOSIS — N1831 Chronic kidney disease, stage 3a: Secondary | ICD-10-CM | POA: Diagnosis present

## 2022-08-29 DIAGNOSIS — E1143 Type 2 diabetes mellitus with diabetic autonomic (poly)neuropathy: Secondary | ICD-10-CM | POA: Diagnosis present

## 2022-08-29 DIAGNOSIS — E43 Unspecified severe protein-calorie malnutrition: Secondary | ICD-10-CM | POA: Diagnosis present

## 2022-08-29 DIAGNOSIS — E1142 Type 2 diabetes mellitus with diabetic polyneuropathy: Secondary | ICD-10-CM | POA: Diagnosis present

## 2022-08-29 DIAGNOSIS — J449 Chronic obstructive pulmonary disease, unspecified: Secondary | ICD-10-CM | POA: Diagnosis present

## 2022-08-29 DIAGNOSIS — G894 Chronic pain syndrome: Secondary | ICD-10-CM | POA: Diagnosis present

## 2022-08-29 DIAGNOSIS — E785 Hyperlipidemia, unspecified: Secondary | ICD-10-CM | POA: Diagnosis present

## 2022-08-29 DIAGNOSIS — D494 Neoplasm of unspecified behavior of bladder: Secondary | ICD-10-CM | POA: Diagnosis not present

## 2022-08-29 DIAGNOSIS — F0393 Unspecified dementia, unspecified severity, with mood disturbance: Secondary | ICD-10-CM | POA: Diagnosis present

## 2022-08-29 DIAGNOSIS — I129 Hypertensive chronic kidney disease with stage 1 through stage 4 chronic kidney disease, or unspecified chronic kidney disease: Secondary | ICD-10-CM | POA: Diagnosis present

## 2022-08-29 DIAGNOSIS — F4312 Post-traumatic stress disorder, chronic: Secondary | ICD-10-CM

## 2022-08-29 DIAGNOSIS — F17210 Nicotine dependence, cigarettes, uncomplicated: Secondary | ICD-10-CM | POA: Diagnosis present

## 2022-08-29 DIAGNOSIS — Z794 Long term (current) use of insulin: Secondary | ICD-10-CM | POA: Diagnosis not present

## 2022-08-29 DIAGNOSIS — D649 Anemia, unspecified: Secondary | ICD-10-CM | POA: Diagnosis present

## 2022-08-29 DIAGNOSIS — M069 Rheumatoid arthritis, unspecified: Secondary | ICD-10-CM | POA: Diagnosis present

## 2022-08-29 DIAGNOSIS — F3181 Bipolar II disorder: Secondary | ICD-10-CM | POA: Diagnosis not present

## 2022-08-29 DIAGNOSIS — E8809 Other disorders of plasma-protein metabolism, not elsewhere classified: Secondary | ICD-10-CM | POA: Diagnosis present

## 2022-08-29 LAB — CBC WITH DIFFERENTIAL/PLATELET
Abs Immature Granulocytes: 0.05 10*3/uL (ref 0.00–0.07)
Basophils Absolute: 0.1 10*3/uL (ref 0.0–0.1)
Basophils Relative: 2 %
Eosinophils Absolute: 0.2 10*3/uL (ref 0.0–0.5)
Eosinophils Relative: 3 %
HCT: 33.6 % — ABNORMAL LOW (ref 39.0–52.0)
Hemoglobin: 11.1 g/dL — ABNORMAL LOW (ref 13.0–17.0)
Immature Granulocytes: 1 %
Lymphocytes Relative: 39 %
Lymphs Abs: 3 10*3/uL (ref 0.7–4.0)
MCH: 27.3 pg (ref 26.0–34.0)
MCHC: 33 g/dL (ref 30.0–36.0)
MCV: 82.8 fL (ref 80.0–100.0)
Monocytes Absolute: 0.5 10*3/uL (ref 0.1–1.0)
Monocytes Relative: 7 %
Neutro Abs: 3.7 10*3/uL (ref 1.7–7.7)
Neutrophils Relative %: 48 %
Platelets: 315 10*3/uL (ref 150–400)
RBC: 4.06 MIL/uL — ABNORMAL LOW (ref 4.22–5.81)
RDW: 13 % (ref 11.5–15.5)
WBC: 7.6 10*3/uL (ref 4.0–10.5)
nRBC: 0 % (ref 0.0–0.2)

## 2022-08-29 LAB — RETICULOCYTES
Immature Retic Fract: 5.8 % (ref 2.3–15.9)
RBC.: 4.14 MIL/uL — ABNORMAL LOW (ref 4.22–5.81)
Retic Count, Absolute: 104.3 10*3/uL (ref 19.0–186.0)
Retic Ct Pct: 2.5 % (ref 0.4–3.1)

## 2022-08-29 LAB — COMPREHENSIVE METABOLIC PANEL
ALT: 14 U/L (ref 0–44)
AST: 17 U/L (ref 15–41)
Albumin: 2.7 g/dL — ABNORMAL LOW (ref 3.5–5.0)
Alkaline Phosphatase: 59 U/L (ref 38–126)
Anion gap: 10 (ref 5–15)
BUN: 36 mg/dL — ABNORMAL HIGH (ref 6–20)
CO2: 25 mmol/L (ref 22–32)
Calcium: 8.6 mg/dL — ABNORMAL LOW (ref 8.9–10.3)
Chloride: 104 mmol/L (ref 98–111)
Creatinine, Ser: 1.5 mg/dL — ABNORMAL HIGH (ref 0.61–1.24)
GFR, Estimated: 55 mL/min — ABNORMAL LOW (ref >60)
Glucose, Bld: 142 mg/dL — ABNORMAL HIGH (ref 70–99)
Potassium: 3.8 mmol/L (ref 3.5–5.1)
Sodium: 139 mmol/L (ref 135–145)
Total Bilirubin: 0.4 mg/dL (ref 0.3–1.2)
Total Protein: 5.5 g/dL — ABNORMAL LOW (ref 6.5–8.1)

## 2022-08-29 LAB — IRON AND TIBC
Iron: 46 ug/dL (ref 45–182)
Saturation Ratios: 14 % — ABNORMAL LOW (ref 17.9–39.5)
TIBC: 323 ug/dL (ref 250–450)
UIBC: 277 ug/dL

## 2022-08-29 LAB — VITAMIN B12: Vitamin B-12: 497 pg/mL (ref 180–914)

## 2022-08-29 LAB — GLUCOSE, CAPILLARY
Glucose-Capillary: 116 mg/dL — ABNORMAL HIGH (ref 70–99)
Glucose-Capillary: 253 mg/dL — ABNORMAL HIGH (ref 70–99)

## 2022-08-29 LAB — FOLATE: Folate: 13 ng/mL (ref >5.9)

## 2022-08-29 LAB — FERRITIN: Ferritin: 103 ng/mL (ref 24–336)

## 2022-08-29 LAB — PHOSPHORUS: Phosphorus: 5.8 mg/dL — ABNORMAL HIGH (ref 2.5–4.6)

## 2022-08-29 LAB — MAGNESIUM: Magnesium: 1.9 mg/dL (ref 1.7–2.4)

## 2022-08-29 MED ORDER — TOUJEO MAX SOLOSTAR 300 UNIT/ML ~~LOC~~ SOPN: 40.0000 [IU] | PEN_INJECTOR | Freq: Two times a day (BID) | SUBCUTANEOUS | Status: DC

## 2022-08-29 MED ORDER — INSULIN GLARGINE-YFGN 100 UNIT/ML ~~LOC~~ SOLN
40.0000 [IU] | Freq: Two times a day (BID) | SUBCUTANEOUS | Status: DC
  Filled 2022-08-29: qty 0.4

## 2022-08-29 MED ORDER — AMOXICILLIN-POT CLAVULANATE 875-125 MG PO TABS: 1.0000 | ORAL_TABLET | Freq: Two times a day (BID) | ORAL | 0 refills | Status: AC

## 2022-08-29 MED ORDER — SENNOSIDES-DOCUSATE SODIUM 8.6-50 MG PO TABS: 1.0000 | ORAL_TABLET | Freq: Every evening | ORAL | 0 refills | Status: DC | PRN

## 2022-08-29 MED ORDER — HUMALOG KWIKPEN 200 UNIT/ML ~~LOC~~ SOPN: 20.0000 [IU] | PEN_INJECTOR | Freq: Three times a day (TID) | SUBCUTANEOUS | Status: DC

## 2022-08-29 MED ORDER — ACETAMINOPHEN 325 MG PO TABS: 650.0000 mg | ORAL_TABLET | Freq: Four times a day (QID) | ORAL | 0 refills | Status: DC | PRN

## 2022-08-29 MED ORDER — ONDANSETRON HCL 4 MG PO TABS: 4.0000 mg | ORAL_TABLET | Freq: Four times a day (QID) | ORAL | 0 refills | Status: DC | PRN

## 2022-08-29 MED ORDER — NICOTINE 21 MG/24HR TD PT24: 21.0000 mg | MEDICATED_PATCH | Freq: Every day | TRANSDERMAL | 0 refills | Status: DC

## 2022-08-29 NOTE — Progress Notes (Signed)
Vital signs stable. Rounds completed Q2H. Patient resting in bed. All safety measures in place.

## 2022-08-29 NOTE — Discharge Summary (Signed)
Physician Discharge Summary   Patient: Vincent [name] MRN: [id number] DOB: 1965-10-22  Admit date:     08/25/2022  Discharge date: 08/29/2022  Discharge Physician: Marguerita Merles, DO   PCP: Dois Davenport, MD   Recommendations at discharge:   Follow-up with PCP within 1 to 2 weeks and repeat CBC, CMP, mag, Phos within 1 week With rheumatology in the outpatient setting within 1 to 2 weeks Follow-up with neurology in outpatient setting  Discharge Diagnoses: Principal Problem:   AKI (acute kidney injury) (HCC) Active Problems:   Bladder tumor   Bipolar II disorder, most recent episode major depressive (HCC)   History of seizure   Protein-calorie malnutrition, severe (HCC)   Acute encephalopathy   Hyperlipidemia   Type 2 diabetes mellitus with hyperglycemia (HCC)   Hypothyroidism   History of rheumatoid arthritis   Tobacco abuse   Chronic post-traumatic stress disorder (PTSD)   Chest pain   Essential hypertension  Resolved Problems:   * No resolved hospital problems. *  Hospital Course: Patient is a 57 year old Caucasian male with past medical history significant for but not limited to uncontrolled Diabetes Mellitus Type 2 peripheral neuropathy, anxiety, chronic pain syndrome, bipolar 2 disorder, PTSD, rheumatoid arthritis, CAD, COPD, GERD, history of bladder cancer and gastroparesis as well as levels of comorbidities who was brought to the hospital with confusion and hypotension.  He was recently started on baclofen, Requip and hydroxyzine.  He also has a history of autonomic dysfunction causing orthostatic hypotension and has been having episodes of forgetfulness.  In the ED patient's effusion had resolved however he continues to still be extremely weak and was orthostatic today.  We ordered TED hose and abdominal binder and given him a fluid bolus.  He improved his orthostatics and he is now stable for discharge home and follow-up with PCP, neurology and psychiatry as an possibly  surgery if necessary.  Assessment and Plan:  Acute Encephalopathy improved -Likely medication induced, resolved -Baclofen, Atarax, Requip on hold likely can resume but will hold and stop baclofen -Likely from underlying dementia -CT head from last admission was normal -Checked MRI brain and showed "No acute intracranial process. Cerebral volume is within normal limits for age." -Follow-up with neurology in outpatient setting   Bilateral lower extremity weakness/peripheral neuropathy -PT consulted and recommended Home Health -MRI of lumbar spine showed no spinal stenosis and largely normal MRI, mild central disc protrusion with annular fissure of L5-S1.  No neural impingement.  Mild left L3 neural foraminal stenosis due to disc bulging. -Continue Pregabalin 200 mgpo TID at discharge follow-up with neurology in outpatient setting   Orthostatic Hypotension, improved -Likely from autonomic dysfunction -Bilateral TED hose, abdominal binder ordered -Checked orthostatics every 4 hours but order stopped -Consider adding midodrine versus pyridostigmine if blood pressure continues to be low despite TED hose and abdominal binder but Not on the Patient -Given a Bolus of 1 Liter today  -Checked cortisol level and was 4.2 -Orthostatics done and improved   Lactic Acidosis -Likely in setting of hypotension -Lactic Acid Level Trend: Recent Labs  Lab 08/25/22 2137 08/25/22 2333  LATICACIDVEN 2.2* 2.0*  -Improved and follow-up in outpatient setting  CKD stage IIIa/renal insufficiency Hyperphosphatemia, mild Metabolic Acidosis -BUN/Cr Trend: Recent Labs  Lab 08/25/22 2137 08/25/22 2145 08/26/22 0814 08/27/22 0930 08/28/22 1025 08/29/22 0045  BUN 34* 33* 26* 20 30* 36*  CREATININE 1.79* 1.80* 1.13 1.21 1.25* 1.50*  -Phos Level Trend: Recent Labs  Lab 08/28/22 1025 08/29/22 0045  PHOS  5.1* 5.8*  -Patient has a CO2 of 25, anion gap of 10, chloride level of 104 now -Avoid  Nephrotoxic Medications, Contrast Dyes, Hypotension and Dehydration to Ensure Adequate Renal Perfusion and will need to Renally Adjust Meds -Continue to Monitor and Trend Renal Function carefully and repeat CMP in the AM     Uncontrolled Diabetes Mellitus Type 2 -Continue sliding scale insulin with NovoLog -Continue Semglee 80 units sq Daily and Moderate Novolog AC -Hemoglobin A1c was 14.1 -CBG Trend: Recent Labs  Lab 08/25/22 2137 08/25/22 2145 08/26/22 0338 08/26/22 0814 08/26/22 1133 08/27/22 0930 08/27/22 1220 08/28/22 1025 08/28/22 1157 08/29/22 0045 08/29/22 0732 08/29/22 1242  GLUCOSE 256* 253*  --  219*  --  283*  --  199*  --  142*  --   --   GLUCAP  --   --    < >  --    < >  --    < >  --    < >  --    < > 253*   < > = values in this interval not displayed.  -Follow-up with the outpatient diabetes education coordinator and continue Toujeo 40 mg twice daily and continue with Humalog 20 units 3 times daily with meals  Right Buttock Abscess -Had I and D completed at [facility] ED -Had some erythema and purulent drainage -GI consulted and they probed the wound and found to have already had the loculations were screened and he continues to have purulent fluid and erythema -Surgery started patient on empiric Augmentin for 5 days and recommending possible wound check in the office as well -Continue with Augmentin for discharge and follow-up with general surgery as needed   BPH -Held Flomax and resume at discharge   Hyperlipidemia -Held Crestor but can resume   Bipolar Disorder -Continue Lurasidone 40 mg po at bedtime and Lamotrigine 150 mg po BID -Follow-up with psych in outpatient setting   Rheumatoid Arthritis -Continue Hydroxychloroquine 200 mg po BID -Follow-up with rheumatology in outpatient setting  Normocytic Anemia -Hgb/Hct Trend: Recent Labs  Lab 08/25/22 2137 08/25/22 2145 08/26/22 0814 08/27/22 0930 08/28/22 1025 08/29/22 0045  HGB 12.6* 12.2*   12.6* 11.9* 11.7* 11.7* 11.1*  HCT 36.5* 36.0*  37.0* 34.4* 35.7* 35.5* 33.6*  MCV 78.0*  --  76.6* 80.2 80.7 82.8  -Check Anemia Panel and it showed an iron level of 14, UIBC of 277, TIBC 323, saturation ratios of 14%, ferritin level 103, folate level 13.0 and vitamin B12 497 -Repeat CBC in the AM   History of Previous Stroke-like Symptoms -On Plavix placed be neuro dating back Sept 2022.  -Follow-up with neurology outpatient setting  Hypoalbuminemia -Patient's Albumin Trend: Recent Labs  Lab 08/25/22 2137 08/28/22 1025 08/29/22 0045  ALBUMIN 3.2* 2.7* 2.7*  -Continue to Monitor and Trend and repeat CMP in the AM  Consultants: None Procedures performed: As delineated as above  Disposition: Home health Diet recommendation:  Discharge Diet Orders (From admission, onward)     Start     Ordered   08/29/22 0000  Diet - low sodium heart healthy        08/29/22 1426   08/29/22 0000  Diet Carb Modified        08/29/22 1426           Cardiac and Carb modified diet DISCHARGE MEDICATION: Allergies as of 08/29/2022       Reactions   Celecoxib Anaphylaxis, Swelling, Rash   Tongue swells   Hydrocodone Rash,  Other (See Comments)   "blisters developed on arms"   Sulfa Antibiotics Rash   Sulfacetamide Sodium Rash        Medication List     STOP taking these medications    baclofen 10 MG tablet Commonly known as: LIORESAL   clindamycin 300 MG capsule Commonly known as: CLEOCIN   ibuprofen 200 MG tablet Commonly known as: ADVIL       TAKE these medications    acetaminophen 325 MG tablet Commonly known as: TYLENOL Take 2 tablets (650 mg total) by mouth every 6 (six) hours as needed for mild pain (or Fever >/= 101). What changed:  medication strength how much to take when to take this reasons to take this   amoxicillin-clavulanate 875-125 MG tablet Commonly known as: AUGMENTIN Take 1 tablet by mouth every 12 (twelve) hours for 5 days.   clopidogrel 75  MG tablet Commonly known as: PLAVIX Take 75 mg by mouth daily. Wife called and spoke with nurse.  This is a medication the patient takes at home that was not entered at admission. Dr. Sibyl Parr informed.   dicyclomine 20 MG tablet Commonly known as: BENTYL Take 1 tablet (20 mg total) by mouth 2 (two) times daily as needed (abdominal cramping).   DULoxetine 60 MG capsule Commonly known as: CYMBALTA Take 60 mg by mouth daily. Wife called and spoke with nurse.  This is a medication the patient takes at home that was not entered at admission. Dr. Sibyl Parr informed.   famotidine 20 MG tablet Commonly known as: PEPCID Take 1 tablet (20 mg total) by mouth 2 (two) times daily.   HumaLOG KwikPen 200 UNIT/ML KwikPen Generic drug: insulin lispro Inject 20 Units into the skin in the morning, at noon, and at bedtime. What changed: how much to take   hydroxychloroquine 200 MG tablet Commonly known as: PLAQUENIL Take 200 mg by mouth 2 (two) times daily. Wife called and spoke with nurse.  This is a medication the patient takes at home that was not entered at admission. Dr. Sibyl Parr informed.   hydrOXYzine 25 MG tablet Commonly known as: ATARAX Take 25 mg by mouth at bedtime as needed for anxiety or itching.   lamoTRIgine 150 MG tablet Commonly known as: LAMICTAL Take 150 mg by mouth 2 (two) times daily. Wife called and spoke with nurse.  This is a medication the patient takes at home that was not entered at admission. Dr. Sibyl Parr informed.   losartan 25 MG tablet Commonly known as: COZAAR Take 25 mg by mouth daily.   lurasidone 20 MG Tabs tablet Commonly known as: LATUDA Take 40 mg by mouth at bedtime. What changed: Another medication with the same name was removed. Continue taking this medication, and follow the directions you see here.   nicotine 21 mg/24hr patch Commonly known as: NICODERM CQ - dosed in mg/24 hours Place 1 patch (21 mg total) onto the skin daily. Start taking on: August 30, 2022    omeprazole 40 MG capsule Commonly known as: PRILOSEC Take 40 mg by mouth daily.   ondansetron 4 MG tablet Commonly known as: ZOFRAN Take 1 tablet (4 mg total) by mouth every 6 (six) hours as needed for nausea.   pregabalin 200 MG capsule Commonly known as: LYRICA Take 200 mg by mouth 3 (three) times daily.   rOPINIRole 0.5 MG tablet Commonly known as: REQUIP Take 0.5 mg by mouth at bedtime.   rosuvastatin 10 MG tablet Commonly known as: CRESTOR Take 1 tablet (10  mg total) by mouth daily. What changed: when to take this   senna-docusate 8.6-50 MG tablet Commonly known as: Senokot-S Take 1 tablet by mouth at bedtime as needed for mild constipation.   tamsulosin 0.4 MG Caps capsule Commonly known as: FLOMAX Take 0.4 mg by mouth at bedtime.   Toujeo Max SoloStar 300 UNIT/ML Solostar Pen Generic drug: insulin glargine (2 Unit Dial) Inject 40 Units into the skin 2 (two) times daily at 8 am and 10 pm. What changed:  how much to take when to take this   venlafaxine XR 150 MG 24 hr capsule Commonly known as: EFFEXOR-XR Take 150 mg by mouth daily with breakfast. Wife called and spoke with nurse.  This is a medication the patient takes at home that was not entered at admission. Dr. Sibyl Parr informed.        Discharge Exam: Filed Weights   08/25/22 2207  Weight: 80.7 kg   Vitals:   08/29/22 1032 08/29/22 1034  BP: 126/79 129/68  Pulse: 85 86  Resp:    Temp:    SpO2:     Examination: Physical Exam:  Constitutional: WN/WD [place] male who is chronically ill-appearing in no acute distress appears calm Respiratory: Diminished to auscultation bilaterally, no wheezing, rales, rhonchi or crackles. Normal respiratory effort and patient is not tachypenic. No accessory muscle use.  Unlabored breathing Cardiovascular: RRR, no murmurs / rubs / gallops. S1 and S2 auscultated. No extremity edema. Abdomen: Soft, non-tender, mildly distended.. Bowel sounds positive.  GU:  Deferred. Musculoskeletal: No clubbing / cyanosis of digits/nails. No joint deformity upper and lower extremities.  Skin: Has a buttock wound that is draining and slightly erythematous and has multiple skin tattoos noted Neurologic: CN 2-12 grossly intact with no focal deficits.  Romberg sign cerebellar reflexes not assessed.  Psychiatric: Normal judgment and insight. Alert and alert and has a normal mood and affect.  Condition at discharge: stable  The results of significant diagnostics from this hospitalization (including imaging, microbiology, ancillary and laboratory) are listed below for reference.   Imaging Studies: MR BRAIN WO CONTRAST  Result Date: 08/27/2022 CLINICAL DATA:  Memory loss EXAM: MRI HEAD WITHOUT CONTRAST TECHNIQUE: Multiplanar, multiecho pulse sequences of the brain and surrounding structures were obtained without intravenous contrast. COMPARISON:  06/06/2021, correlation is also made with 07/18/2022 CT head FINDINGS: Brain: No restricted diffusion to suggest acute or subacute infarct. No acute hemorrhage, mass, mass effect, or midline shift. No hydrocephalus or extra-axial collection. Normal pituitary and craniocervical junction. No hemosiderin deposition to suggest remote hemorrhage. Cerebral volume is within normal limits for age. Scattered T2 hyperintense signal in the periventricular white matter, likely the sequela of mild chronic small vessel ischemic disease. Vascular: Normal arterial flow voids. Skull and upper cervical spine: Normal marrow signal. Sinuses/Orbits: Clear paranasal sinuses. No acute finding in the orbits. Other: The mastoid air cells are well aerated. IMPRESSION: No acute intracranial process. Cerebral volume is within normal limits for age. Electronically Signed   By: Wiliam Ke M.D.   On: 08/27/2022 22:13   MR LUMBAR SPINE WO CONTRAST  Result Date: 08/26/2022 CLINICAL DATA:  57 year old male with persistent low back pain. Weakness, neuropathy.  Falls. EXAM: MRI LUMBAR SPINE WITHOUT CONTRAST TECHNIQUE: Multiplanar, multisequence MR imaging of the lumbar spine was performed. No intravenous contrast was administered. COMPARISON:  Thoracic spine radiographs 0244 hours today. Outside lumbar MRI 01/31/2023. CT Abdomen and Pelvis 06/21/2022. FINDINGS: Segmentation:  Normal on the comparison CT. Alignment: Maintained lumbar lordosis, stable since 2020.  No significant scoliosis or spondylolisthesis. Vertebrae: Visualized bone marrow signal is within normal limits. No marrow edema or evidence of acute osseous abnormality. Intact visible sacrum and SI joints. Conus medullaris and cauda equina: Conus extends to the T12-L1 level, appears normal. Motion artifact on initial axial T2 weighted imaging which was repeated with good results. Cauda equina nerve roots appear stable and normal. Paraspinal and other soft tissues: Negative. Disc levels: T12-L1:  Negative. L1-L2:  Negative. L2-L3: Maintained disc height and signal. Subtle biforaminal disc bulging. No convincing stenosis. L3-L4: Subtle disc desiccation and disc bulging, most affecting the left neural foramen. Mild facet hypertrophy. No spinal or lateral recess stenosis. Mild left L3 neural foraminal stenosis. L4-L5: Relatively maintained disc height and signal with minimal far lateral disc bulging. Mild facet and ligament flavum hypertrophy. No stenosis. L5-S1: Relatively maintained disc height and signal. But there is a small broad-based central disc protrusion with annular fissure on series 7, image 41. No stenosis. IMPRESSION: No spinal stenosis and largely normal for age MRI appearance of the lumbar spine; - mild central disc protrusion with annular fissure at L5-S1. No associated neural impingement. - mild left L3 neural foraminal stenosis due to disc bulging. Electronically Signed   By: Odessa Fleming M.D.   On: 08/26/2022 04:30   DG Thoracic Spine 2 View  Result Date: 08/26/2022 CLINICAL DATA:  Back pain and  weakness EXAM: THORACIC SPINE 2 VIEWS COMPARISON:  None Available. FINDINGS: There is no evidence of thoracic spine fracture. Alignment is normal. No other significant bone abnormalities are identified. IMPRESSION: No acute abnormality noted. Electronically Signed   By: Alcide Clever M.D.   On: 08/26/2022 02:58   DG Chest Port 1 View  Result Date: 08/25/2022 CLINICAL DATA:  Question of sepsis to evaluate for abnormality. Weakness and lethargy for 3 days. Confusion. Chest pain EXAM: PORTABLE CHEST 1 VIEW COMPARISON:  05/23/2022 FINDINGS: The heart size and mediastinal contours are within normal limits. Both lungs are clear. The visualized skeletal structures are unremarkable. IMPRESSION: No active disease. Electronically Signed   By: Burman Nieves M.D.   On: 08/25/2022 21:55    Microbiology: Results for orders placed or performed during the hospital encounter of 08/25/22  Blood Culture (routine x 2)     Status: None (Preliminary result)   Collection Time: 08/25/22  9:37 PM   Specimen: BLOOD LEFT ARM  Result Value Ref Range Status   Specimen Description BLOOD LEFT ARM  Final   Special Requests   Final    BOTTLES DRAWN AEROBIC AND ANAEROBIC Blood Culture adequate volume   Culture   Final    NO GROWTH 4 DAYS Performed at [facility], [address] [place], [place] [address]    Report Status PENDING  Incomplete  Blood Culture (routine x 2)     Status: None (Preliminary result)   Collection Time: 08/25/22  9:37 PM   Specimen: BLOOD RIGHT ARM  Result Value Ref Range Status   Specimen Description BLOOD RIGHT ARM  Final   Special Requests   Final    BOTTLES DRAWN AEROBIC AND ANAEROBIC Blood Culture adequate volume   Culture   Final    NO GROWTH 4 DAYS Performed at [facility], [address] [place], [place] [address]    Report Status PENDING  Incomplete   Labs: CBC: Recent Labs  Lab 08/25/22 2137 08/25/22 2145 08/26/22 0814 08/27/22 0930 08/28/22 1025  08/29/22 0045  WBC 11.5*  --  7.8 5.6  6.3 7.6  NEUTROABS 6.7  --  3.9  --  3.0 3.7  HGB 12.6* 12.2*  12.6* 11.9* 11.7* 11.7* 11.1*  HCT 36.5* 36.0*  37.0* 34.4* 35.7* 35.5* 33.6*  MCV 78.0*  --  76.6* 80.2 80.7 82.8  PLT 466*  --  373 321 303 315   Basic Metabolic Panel: Recent Labs  Lab 08/25/22 2137 08/25/22 2145 08/26/22 0814 08/27/22 0930 08/28/22 1025 08/29/22 0045  NA 130* 132*  131* 132* 131* 138 139  K 4.0 4.2  4.2 4.0 4.1 4.4 3.8  CL 97* 99 100 102 104 104  CO2 23  --  24 21* 20* 25  GLUCOSE 256* 253* 219* 283* 199* 142*  BUN 34* 33* 26* 20 30* 36*  CREATININE 1.79* 1.80* 1.13 1.21 1.25* 1.50*  CALCIUM 9.1  --  8.6* 8.4* 8.9 8.6*  MG  --   --  2.1  --  2.0 1.9  PHOS  --   --   --   --  5.1* 5.8*   Liver Function Tests: Recent Labs  Lab 08/25/22 2137 08/28/22 1025 08/29/22 0045  AST 12* 14* 17  ALT 13 13 14   ALKPHOS 83 59 59  BILITOT 0.4 0.3 0.4  PROT 6.4* 5.6* 5.5*  ALBUMIN 3.2* 2.7* 2.7*   CBG: Recent Labs  Lab 08/28/22 0839 08/28/22 1157 08/28/22 1614 08/29/22 0732 08/29/22 1242  GLUCAP 168* 166* 141* 116* 253*   Discharge time spent: greater than 30 minutes.  Signed: Marguerita Merles, DO [facility] 08/29/2022

## 2022-08-29 NOTE — Plan of Care (Signed)
  Problem: Coping: Goal: Ability to adjust to condition or change in health will improve Outcome: Progressing   Problem: Fluid Volume: Goal: Ability to maintain a balanced intake and output will improve Outcome: Progressing   Problem: Nutritional: Goal: Maintenance of adequate nutrition will improve Outcome: Progressing   Problem: Skin Integrity: Goal: Risk for impaired skin integrity will decrease Outcome: Progressing   

## 2022-08-29 NOTE — Progress Notes (Signed)
Physical Therapy Treatment Patient Details Name: Vincent [name] MRN: [id number] DOB: 1965-05-16 Today's Date: 08/29/2022   History of Present Illness 57 year old male who presents with confusion, weakness, and hypotension. AKI present on admission. PMHx: uncontrolled diabetes with A1c > 15, peripheral neuropathy, anxiety disorder, chronic pain syndrome, bipolar 2 disorder, PTSD, DM type 2,  RA, CAD, COPD, GERD, h/o bladder cancer and gastroparesis.    PT Comments    Progressed patient towards goals per plan of care. Pt progressed bed mobility to mod I and able to perform without physical assistance. Demonstrated significant improvements in functional mobility as patient was able to accomplish multiple exercises, ambulation and stair training before returning to room; asymptomatic throughout functional mobility. PT provided education on exercise progression following discharge and reinforced proper stairway navigation techniques. End of session, patient was also educated on ted hose wear schedule for orthostatic hypotension. Anticipating patient will benefit from additional skilled physical therapy to improve functional mobility and independence.    Recommendations for follow up therapy are one component of a multi-disciplinary discharge planning process, led by the attending physician.  Recommendations may be updated based on patient status, additional functional criteria and insurance authorization.  Follow Up Recommendations  Can patient physically be transported by private vehicle: No    Assistance Recommended at Discharge Intermittent Supervision/Assistance  Patient can return home with the following A little help with walking and/or transfers;A little help with bathing/dressing/bathroom;Help with stairs or ramp for entrance;Assist for transportation   Equipment Recommendations  None recommended by PT    Recommendations for Other Services       Precautions / Restrictions  Precautions Precautions: Fall Precaution Comments: Orthostatic - watch BP Restrictions Weight Bearing Restrictions: No     Mobility  Bed Mobility Overal bed mobility: Modified Independent             General bed mobility comments: Able to perform bed mobility without physical assistance; lowered HOB to simulate home environment.    Transfers Overall transfer level: Needs assistance Equipment used: Rolling walker (2 wheels) Transfers: Sit to/from Stand Sit to Stand: Min guard           General transfer comment: Serial sit <> stand x5 Performed without physical assistance; strong power up with elevated bed. Able to perform sit <> stand transfer with bed lowered at min guard    Ambulation/Gait Ambulation/Gait assistance: Min guard Gait Distance (Feet): 180 Feet Assistive device: Rolling walker (2 wheels) Gait Pattern/deviations: Step-through pattern, Decreased step length - right, Decreased step length - left, Decreased stride length, Trunk flexed Gait velocity: Decreased Gait velocity interpretation: <1.8 ft/sec, indicate of risk for recurrent falls Pre-gait activities: Marching at EOB; x10 bilateral legs; no additional symptoms General Gait Details: Progressed ambulation distance without additional symptoms; Still presented with slowed velocity   Stairs Stairs: Yes Stairs assistance: Min guard Stair Management: One rail Right, One rail Left, Step to pattern, Forwards Number of Stairs: 2 General stair comments: HHA to simulate cane; ascending without physical assist. Limited step due to IV line   Wheelchair Mobility    Modified Rankin (Stroke Patients Only)       Balance Overall balance assessment: Needs assistance Sitting-balance support: No upper extremity supported, Feet supported Sitting balance-Leahy Scale: Good Sitting balance - Comments: sitting EOB   Standing balance support: During functional activity, No upper extremity supported Standing  balance-Leahy Scale: Fair Standing balance comment: Able to stand near RW without UE extremity support to turn towards stair case. Presented with very cautious  steps                            Cognition Arousal/Alertness: Awake/alert Behavior During Therapy: WFL for tasks assessed/performed Overall Cognitive Status: Within Functional Limits for tasks assessed                                          Exercises General Exercises - Lower Extremity Long Arc Quad: 10 reps, Seated, Strengthening, Both Hip Flexion/Marching: AROM, 10 reps, Standing    General Comments        Pertinent Vitals/Pain Pain Assessment Pain Assessment: 0-10 Pain Score: 4  Pain Location: Sacrum, Coccyx, Right buttock Pain Descriptors / Indicators: Sore Pain Intervention(s): Monitored during session    Home Living                          Prior Function            PT Goals (current goals can now be found in the care plan section) Acute Rehab PT Goals Patient Stated Goal: Get BP under control PT Goal Formulation: With patient Time For Goal Achievement: 09/09/22 Potential to Achieve Goals: Good Progress towards PT goals: Progressing toward goals    Frequency    Min 3X/week      PT Plan Current plan remains appropriate    Co-evaluation              AM-PAC PT "6 Clicks" Mobility   Outcome Measure  Help needed turning from your back to your side while in a flat bed without using bedrails?: None Help needed moving from lying on your back to sitting on the side of a flat bed without using bedrails?: None Help needed moving to and from a bed to a chair (including a wheelchair)?: A Little Help needed standing up from a chair using your arms (e.g., wheelchair or bedside chair)?: A Little Help needed to walk in hospital room?: A Little Help needed climbing 3-5 steps with a railing? : A Little 6 Click Score: 20    End of Session Equipment Utilized  During Treatment: Gait belt Activity Tolerance: Patient tolerated treatment well Patient left: in bed;with call bell/phone within reach Nurse Communication: Mobility status PT Visit Diagnosis: Unsteadiness on feet (R26.81);Difficulty in walking, not elsewhere classified (R26.2)     Time: 1610-9604 PT Time Calculation (min) (ACUTE ONLY): 27 min  Charges:  $Gait Training: 8-22 mins $Therapeutic Exercise: 8-22 mins                     Christene Lye, SPT Acute Rehabilitation Services [phone] Secure chat preferred     Christene Lye 08/29/2022, 3:58 PM

## 2022-08-30 LAB — CULTURE, BLOOD (ROUTINE X 2)

## 2022-08-31 ENCOUNTER — Encounter: Payer: Medicare HMO | Admitting: Occupational Therapy

## 2022-09-13 ENCOUNTER — Ambulatory Visit: Payer: Medicare HMO | Admitting: Podiatry

## 2022-09-24 ENCOUNTER — Inpatient Hospital Stay (HOSPITAL_COMMUNITY): Payer: Medicare HMO

## 2022-09-24 ENCOUNTER — Emergency Department (HOSPITAL_COMMUNITY): Payer: Medicare HMO

## 2022-09-24 ENCOUNTER — Inpatient Hospital Stay (HOSPITAL_COMMUNITY)
Admission: EM | Admit: 2022-09-24 | Discharge: 2022-09-30 | DRG: 871 | Disposition: A | Discharge status: Home / Self Care | Payer: Medicare HMO | Attending: Internal Medicine | Admitting: Internal Medicine

## 2022-09-24 ENCOUNTER — Encounter (HOSPITAL_COMMUNITY): Payer: Self-pay

## 2022-09-24 ENCOUNTER — Other Ambulatory Visit: Payer: Self-pay

## 2022-09-24 DIAGNOSIS — E1122 Type 2 diabetes mellitus with diabetic chronic kidney disease: Secondary | ICD-10-CM | POA: Diagnosis present

## 2022-09-24 DIAGNOSIS — G9340 Encephalopathy, unspecified: Secondary | ICD-10-CM | POA: Diagnosis present

## 2022-09-24 DIAGNOSIS — I10 Essential (primary) hypertension: Secondary | ICD-10-CM | POA: Diagnosis not present

## 2022-09-24 DIAGNOSIS — G894 Chronic pain syndrome: Secondary | ICD-10-CM | POA: Diagnosis present

## 2022-09-24 DIAGNOSIS — Z1629 Resistance to other single specified antibiotic: Secondary | ICD-10-CM | POA: Diagnosis present

## 2022-09-24 DIAGNOSIS — G9341 Metabolic encephalopathy: Secondary | ICD-10-CM | POA: Diagnosis present

## 2022-09-24 DIAGNOSIS — Z794 Long term (current) use of insulin: Secondary | ICD-10-CM | POA: Diagnosis not present

## 2022-09-24 DIAGNOSIS — Z8673 Personal history of transient ischemic attack (TIA), and cerebral infarction without residual deficits: Secondary | ICD-10-CM

## 2022-09-24 DIAGNOSIS — E1151 Type 2 diabetes mellitus with diabetic peripheral angiopathy without gangrene: Secondary | ICD-10-CM | POA: Diagnosis present

## 2022-09-24 DIAGNOSIS — F3181 Bipolar II disorder: Secondary | ICD-10-CM | POA: Diagnosis present

## 2022-09-24 DIAGNOSIS — R6520 Severe sepsis without septic shock: Secondary | ICD-10-CM | POA: Diagnosis not present

## 2022-09-24 DIAGNOSIS — I959 Hypotension, unspecified: Secondary | ICD-10-CM | POA: Diagnosis present

## 2022-09-24 DIAGNOSIS — Z885 Allergy status to narcotic agent status: Secondary | ICD-10-CM

## 2022-09-24 DIAGNOSIS — E039 Hypothyroidism, unspecified: Secondary | ICD-10-CM | POA: Diagnosis present

## 2022-09-24 DIAGNOSIS — A4102 Sepsis due to Methicillin resistant Staphylococcus aureus: Principal | ICD-10-CM | POA: Diagnosis present

## 2022-09-24 DIAGNOSIS — E1165 Type 2 diabetes mellitus with hyperglycemia: Secondary | ICD-10-CM | POA: Diagnosis present

## 2022-09-24 DIAGNOSIS — Z9151 Personal history of suicidal behavior: Secondary | ICD-10-CM

## 2022-09-24 DIAGNOSIS — R4701 Aphasia: Secondary | ICD-10-CM | POA: Diagnosis present

## 2022-09-24 DIAGNOSIS — N179 Acute kidney failure, unspecified: Secondary | ICD-10-CM | POA: Diagnosis present

## 2022-09-24 DIAGNOSIS — Z8711 Personal history of peptic ulcer disease: Secondary | ICD-10-CM

## 2022-09-24 DIAGNOSIS — Z7902 Long term (current) use of antithrombotics/antiplatelets: Secondary | ICD-10-CM

## 2022-09-24 DIAGNOSIS — E785 Hyperlipidemia, unspecified: Secondary | ICD-10-CM | POA: Diagnosis present

## 2022-09-24 DIAGNOSIS — Z833 Family history of diabetes mellitus: Secondary | ICD-10-CM

## 2022-09-24 DIAGNOSIS — M62838 Other muscle spasm: Secondary | ICD-10-CM | POA: Diagnosis present

## 2022-09-24 DIAGNOSIS — R29810 Facial weakness: Secondary | ICD-10-CM | POA: Diagnosis present

## 2022-09-24 DIAGNOSIS — J449 Chronic obstructive pulmonary disease, unspecified: Secondary | ICD-10-CM | POA: Diagnosis present

## 2022-09-24 DIAGNOSIS — K3184 Gastroparesis: Secondary | ICD-10-CM | POA: Diagnosis present

## 2022-09-24 DIAGNOSIS — N400 Enlarged prostate without lower urinary tract symptoms: Secondary | ICD-10-CM | POA: Diagnosis present

## 2022-09-24 DIAGNOSIS — I951 Orthostatic hypotension: Secondary | ICD-10-CM | POA: Diagnosis present

## 2022-09-24 DIAGNOSIS — Z811 Family history of alcohol abuse and dependence: Secondary | ICD-10-CM

## 2022-09-24 DIAGNOSIS — M5136 Other intervertebral disc degeneration, lumbar region: Secondary | ICD-10-CM | POA: Diagnosis present

## 2022-09-24 DIAGNOSIS — Z882 Allergy status to sulfonamides status: Secondary | ICD-10-CM

## 2022-09-24 DIAGNOSIS — I129 Hypertensive chronic kidney disease with stage 1 through stage 4 chronic kidney disease, or unspecified chronic kidney disease: Secondary | ICD-10-CM | POA: Diagnosis present

## 2022-09-24 DIAGNOSIS — A419 Sepsis, unspecified organism: Secondary | ICD-10-CM | POA: Diagnosis not present

## 2022-09-24 DIAGNOSIS — Z8619 Personal history of other infectious and parasitic diseases: Secondary | ICD-10-CM

## 2022-09-24 DIAGNOSIS — Z8551 Personal history of malignant neoplasm of bladder: Secondary | ICD-10-CM

## 2022-09-24 DIAGNOSIS — L0231 Cutaneous abscess of buttock: Secondary | ICD-10-CM | POA: Diagnosis present

## 2022-09-24 DIAGNOSIS — Z8249 Family history of ischemic heart disease and other diseases of the circulatory system: Secondary | ICD-10-CM

## 2022-09-24 DIAGNOSIS — N1831 Chronic kidney disease, stage 3a: Secondary | ICD-10-CM | POA: Diagnosis present

## 2022-09-24 DIAGNOSIS — K219 Gastro-esophageal reflux disease without esophagitis: Secondary | ICD-10-CM | POA: Diagnosis present

## 2022-09-24 DIAGNOSIS — G250 Essential tremor: Secondary | ICD-10-CM | POA: Diagnosis present

## 2022-09-24 DIAGNOSIS — Z87820 Personal history of traumatic brain injury: Secondary | ICD-10-CM

## 2022-09-24 DIAGNOSIS — G4733 Obstructive sleep apnea (adult) (pediatric): Secondary | ICD-10-CM | POA: Diagnosis present

## 2022-09-24 DIAGNOSIS — E1142 Type 2 diabetes mellitus with diabetic polyneuropathy: Secondary | ICD-10-CM | POA: Diagnosis present

## 2022-09-24 DIAGNOSIS — F17210 Nicotine dependence, cigarettes, uncomplicated: Secondary | ICD-10-CM | POA: Diagnosis present

## 2022-09-24 DIAGNOSIS — M069 Rheumatoid arthritis, unspecified: Secondary | ICD-10-CM | POA: Diagnosis present

## 2022-09-24 DIAGNOSIS — R4182 Altered mental status, unspecified: Secondary | ICD-10-CM | POA: Diagnosis not present

## 2022-09-24 DIAGNOSIS — Z79899 Other long term (current) drug therapy: Secondary | ICD-10-CM

## 2022-09-24 DIAGNOSIS — F4310 Post-traumatic stress disorder, unspecified: Secondary | ICD-10-CM | POA: Diagnosis present

## 2022-09-24 DIAGNOSIS — Z87442 Personal history of urinary calculi: Secondary | ICD-10-CM

## 2022-09-24 DIAGNOSIS — E1143 Type 2 diabetes mellitus with diabetic autonomic (poly)neuropathy: Secondary | ICD-10-CM | POA: Diagnosis present

## 2022-09-24 DIAGNOSIS — I2510 Atherosclerotic heart disease of native coronary artery without angina pectoris: Secondary | ICD-10-CM | POA: Diagnosis present

## 2022-09-24 DIAGNOSIS — Z888 Allergy status to other drugs, medicaments and biological substances status: Secondary | ICD-10-CM

## 2022-09-24 DIAGNOSIS — G40909 Epilepsy, unspecified, not intractable, without status epilepticus: Secondary | ICD-10-CM | POA: Diagnosis present

## 2022-09-24 DIAGNOSIS — R579 Shock, unspecified: Secondary | ICD-10-CM

## 2022-09-24 DIAGNOSIS — R6521 Severe sepsis with septic shock: Secondary | ICD-10-CM | POA: Diagnosis present

## 2022-09-24 LAB — I-STAT CHEM 8, ED
BUN: 24 mg/dL — ABNORMAL HIGH (ref 6–20)
Calcium, Ion: 1.15 mmol/L (ref 1.15–1.40)
Chloride: 108 mmol/L (ref 98–111)
Creatinine, Ser: 1.5 mg/dL — ABNORMAL HIGH (ref 0.61–1.24)
Glucose, Bld: 126 mg/dL — ABNORMAL HIGH (ref 70–99)
HCT: 34 % — ABNORMAL LOW (ref 39.0–52.0)
Hemoglobin: 11.6 g/dL — ABNORMAL LOW (ref 13.0–17.0)
Potassium: 4 mmol/L (ref 3.5–5.1)
Sodium: 139 mmol/L (ref 135–145)
TCO2: 22 mmol/L (ref 22–32)

## 2022-09-24 LAB — URINALYSIS, ROUTINE W REFLEX MICROSCOPIC
Bilirubin Urine: NEGATIVE
Glucose, UA: >=500 mg/dL — AB
Ketones, ur: NEGATIVE mg/dL
Nitrite: NEGATIVE
Protein, ur: 100 mg/dL — AB
Specific Gravity, Urine: 1.027 (ref 1.005–1.030)
WBC, UA: >50 WBC/hpf (ref 0–5)
pH: 6 (ref 5.0–8.0)

## 2022-09-24 LAB — CBG MONITORING, ED
Glucose-Capillary: 119 mg/dL — ABNORMAL HIGH (ref 70–99)
Glucose-Capillary: 145 mg/dL — ABNORMAL HIGH (ref 70–99)

## 2022-09-24 LAB — DIFFERENTIAL
Abs Immature Granulocytes: 0.12 10*3/uL — ABNORMAL HIGH (ref 0.00–0.07)
Basophils Absolute: 0.1 10*3/uL (ref 0.0–0.1)
Basophils Relative: 1 %
Eosinophils Absolute: 0.1 10*3/uL (ref 0.0–0.5)
Eosinophils Relative: 2 %
Immature Granulocytes: 2 %
Lymphocytes Relative: 39 %
Lymphs Abs: 3 10*3/uL (ref 0.7–4.0)
Monocytes Absolute: 0.7 10*3/uL (ref 0.1–1.0)
Monocytes Relative: 9 %
Neutro Abs: 3.6 10*3/uL (ref 1.7–7.7)
Neutrophils Relative %: 47 %

## 2022-09-24 LAB — RAPID URINE DRUG SCREEN, HOSP PERFORMED
Amphetamines: NOT DETECTED
Barbiturates: NOT DETECTED
Benzodiazepines: NOT DETECTED
Cocaine: NOT DETECTED
Opiates: NOT DETECTED
Tetrahydrocannabinol: NOT DETECTED

## 2022-09-24 LAB — CBC
HCT: 37.4 % — ABNORMAL LOW (ref 39.0–52.0)
Hemoglobin: 12.6 g/dL — ABNORMAL LOW (ref 13.0–17.0)
MCH: 26.9 pg (ref 26.0–34.0)
MCHC: 33.7 g/dL (ref 30.0–36.0)
MCV: 79.7 fL — ABNORMAL LOW (ref 80.0–100.0)
Platelets: 328 10*3/uL (ref 150–400)
RBC: 4.69 MIL/uL (ref 4.22–5.81)
RDW: 13.7 % (ref 11.5–15.5)
WBC: 7.6 10*3/uL (ref 4.0–10.5)
nRBC: 0 % (ref 0.0–0.2)

## 2022-09-24 LAB — COMPREHENSIVE METABOLIC PANEL
ALT: 16 U/L (ref 0–44)
AST: 20 U/L (ref 15–41)
Albumin: 3.2 g/dL — ABNORMAL LOW (ref 3.5–5.0)
Alkaline Phosphatase: 72 U/L (ref 38–126)
Anion gap: 14 (ref 5–15)
BUN: 20 mg/dL (ref 6–20)
CO2: 20 mmol/L — ABNORMAL LOW (ref 22–32)
Calcium: 9.3 mg/dL (ref 8.9–10.3)
Chloride: 105 mmol/L (ref 98–111)
Creatinine, Ser: 1.48 mg/dL — ABNORMAL HIGH (ref 0.61–1.24)
GFR, Estimated: 56 mL/min — ABNORMAL LOW (ref >60)
Glucose, Bld: 127 mg/dL — ABNORMAL HIGH (ref 70–99)
Potassium: 4 mmol/L (ref 3.5–5.1)
Sodium: 139 mmol/L (ref 135–145)
Total Bilirubin: 0.6 mg/dL (ref 0.3–1.2)
Total Protein: 6.3 g/dL — ABNORMAL LOW (ref 6.5–8.1)

## 2022-09-24 LAB — ETHANOL: Alcohol, Ethyl (B): <10 mg/dL (ref <10)

## 2022-09-24 LAB — LACTIC ACID, PLASMA: Lactic Acid, Venous: 1.6 mmol/L (ref 0.5–1.9)

## 2022-09-24 LAB — APTT: aPTT: 30 seconds (ref 24–36)

## 2022-09-24 LAB — PROTIME-INR
INR: 0.9 (ref 0.8–1.2)
Prothrombin Time: 12.8 seconds (ref 11.4–15.2)

## 2022-09-24 MED ORDER — SODIUM CHLORIDE 0.9 % IV SOLN: 2.0000 g | Freq: Two times a day (BID) | INTRAVENOUS | Status: DC

## 2022-09-24 MED ORDER — SODIUM CHLORIDE 0.9 % IV SOLN
2.0000 g | Freq: Once | INTRAVENOUS | Status: AC
  Administered 2022-09-24: 2 g via INTRAVENOUS
  Filled 2022-09-24: qty 12.5

## 2022-09-24 MED ORDER — LAMOTRIGINE 100 MG PO TABS
150.0000 mg | ORAL_TABLET | Freq: Two times a day (BID) | ORAL | Status: DC
  Administered 2022-09-24 – 2022-09-30 (×12): 150 mg via ORAL
  Filled 2022-09-24 (×3): qty 2
  Filled 2022-09-24: qty 6
  Filled 2022-09-24 (×3): qty 2
  Filled 2022-09-24: qty 6
  Filled 2022-09-24 (×4): qty 2

## 2022-09-24 MED ORDER — IOHEXOL 350 MG/ML SOLN
100.0000 mL | Freq: Once | INTRAVENOUS | Status: AC | PRN
  Administered 2022-09-24: 100 mL via INTRAVENOUS

## 2022-09-24 MED ORDER — VENLAFAXINE HCL ER 75 MG PO CP24
150.0000 mg | ORAL_CAPSULE | Freq: Every day | ORAL | Status: DC
  Administered 2022-09-25 – 2022-09-30 (×6): 150 mg via ORAL
  Filled 2022-09-24 (×6): qty 2

## 2022-09-24 MED ORDER — ACETAMINOPHEN 325 MG PO TABS: 650.0000 mg | ORAL_TABLET | Freq: Four times a day (QID) | ORAL | Status: DC | PRN

## 2022-09-24 MED ORDER — INSULIN ASPART 100 UNIT/ML IJ SOLN
0.0000 [IU] | Freq: Three times a day (TID) | INTRAMUSCULAR | Status: DC
  Administered 2022-09-25 – 2022-09-27 (×7): 5 [IU] via SUBCUTANEOUS
  Administered 2022-09-28: 8 [IU] via SUBCUTANEOUS
  Administered 2022-09-28: 5 [IU] via SUBCUTANEOUS
  Administered 2022-09-28: 3 [IU] via SUBCUTANEOUS
  Administered 2022-09-29: 5 [IU] via SUBCUTANEOUS
  Administered 2022-09-29: 11 [IU] via SUBCUTANEOUS
  Administered 2022-09-30: 5 [IU] via SUBCUTANEOUS

## 2022-09-24 MED ORDER — VANCOMYCIN HCL 2000 MG/400ML IV SOLN
2000.0000 mg | Freq: Once | INTRAVENOUS | Status: AC
  Administered 2022-09-24: 2000 mg via INTRAVENOUS
  Filled 2022-09-24: qty 400

## 2022-09-24 MED ORDER — STROKE: EARLY STAGES OF RECOVERY BOOK: Freq: Once | Status: AC

## 2022-09-24 MED ORDER — SODIUM CHLORIDE 0.9% FLUSH: 3.0000 mL | Freq: Once | INTRAVENOUS | Status: DC

## 2022-09-24 MED ORDER — BACLOFEN 10 MG PO TABS: 10.0000 mg | ORAL_TABLET | Freq: Every day | ORAL | Status: DC

## 2022-09-24 MED ORDER — ROPINIROLE HCL 1 MG PO TABS: 0.5000 mg | ORAL_TABLET | Freq: Every day | ORAL | Status: DC

## 2022-09-24 MED ORDER — PREGABALIN 100 MG PO CAPS
200.0000 mg | ORAL_CAPSULE | Freq: Three times a day (TID) | ORAL | Status: DC
  Administered 2022-09-24 – 2022-09-30 (×17): 200 mg via ORAL
  Filled 2022-09-24 (×17): qty 2

## 2022-09-24 MED ORDER — LURASIDONE HCL 20 MG PO TABS
40.0000 mg | ORAL_TABLET | Freq: Every day | ORAL | Status: DC
  Administered 2022-09-24 – 2022-09-29 (×6): 40 mg via ORAL
  Filled 2022-09-24 (×7): qty 2

## 2022-09-24 MED ORDER — PANTOPRAZOLE SODIUM 40 MG PO TBEC
40.0000 mg | DELAYED_RELEASE_TABLET | Freq: Every day | ORAL | Status: DC
  Administered 2022-09-25 – 2022-09-30 (×6): 40 mg via ORAL
  Filled 2022-09-24 (×6): qty 1

## 2022-09-24 MED ORDER — LIDOCAINE-EPINEPHRINE (PF) 2 %-1:200000 IJ SOLN
20.0000 mL | Freq: Once | INTRAMUSCULAR | Status: AC
  Administered 2022-09-24: 20 mL via INTRADERMAL
  Filled 2022-09-24: qty 20

## 2022-09-24 MED ORDER — DULOXETINE HCL 60 MG PO CPEP
60.0000 mg | ORAL_CAPSULE | Freq: Every day | ORAL | Status: DC
  Administered 2022-09-25 – 2022-09-30 (×6): 60 mg via ORAL
  Filled 2022-09-24 (×6): qty 1

## 2022-09-24 MED ORDER — SODIUM CHLORIDE 0.9 % IV SOLN: 1.0000 g | Freq: Once | INTRAVENOUS | Status: DC

## 2022-09-24 MED ORDER — ROSUVASTATIN CALCIUM 5 MG PO TABS
10.0000 mg | ORAL_TABLET | Freq: Every day | ORAL | Status: DC
  Administered 2022-09-24: 10 mg via ORAL
  Filled 2022-09-24: qty 2

## 2022-09-24 MED ORDER — HYDROXYCHLOROQUINE SULFATE 200 MG PO TABS
200.0000 mg | ORAL_TABLET | Freq: Two times a day (BID) | ORAL | Status: DC
  Administered 2022-09-24 – 2022-09-26 (×4): 200 mg via ORAL
  Filled 2022-09-24 (×6): qty 1

## 2022-09-24 MED ORDER — LACTATED RINGERS IV BOLUS
2000.0000 mL | Freq: Once | INTRAVENOUS | Status: AC
  Administered 2022-09-24: 2000 mL via INTRAVENOUS

## 2022-09-24 NOTE — ED Notes (Signed)
ED TO INPATIENT HANDOFF REPORT  ED Nurse Name and Phone #:  Lucious Groves [phone]  S Name/Age/Gender Vincent [name] 57 y.o. male Room/Bed: RESUSC/RESUSC  Code Status   Code Status: Full Code  Home/SNF/Other Home Patient oriented to: self, place, time, and situation Is this baseline? Yes   Triage Complete: Triage complete  Chief Complaint Acute encephalopathy [G93.40]  Triage Note Pt BIB EMS S CODE STROKE. Lsn 1430. DEFICITS- left sided weakness, aphasia, and left sided facial droop.    Allergies Allergies  Allergen Reactions   Celecoxib Anaphylaxis, Swelling and Rash    Tongue swells   Hydrocodone Rash and Other (See Comments)    "blisters developed on arms"    Sulfa Antibiotics Rash   Sulfacetamide Sodium Rash    Level of Care/Admitting Diagnosis ED Disposition     ED Disposition  Admit   Condition  --   Comment  Hospital Area: [facility] [[id number]]  Level of Care: Progressive [102]  Admit to Progressive based on following criteria: NEUROLOGICAL AND NEUROSURGICAL complex patients with significant risk of instability, who do not meet ICU criteria, yet require close observation or frequent assessment (< / = every 2 - 4 hours) with medical / nursing intervention.  Admit to Progressive based on following criteria: ACUTE MENTAL DISORDER-RELATED Drug/Alcohol Ingestion/Overdose/Withdrawal, Suicidal Ideation/attempt requiring safety sitter and < Q2h monitoring/assessments, moderate to severe agitation that is managed with medication/sitter, CIWA-Ar score < 20.  May admit patient to [facility] if equivalent level of care is available:: Yes  Covid Evaluation: Asymptomatic - no recent exposure (last 10 days) testing not required  Diagnosis: Acute encephalopathy [638756]  Admitting Physician: Cathleen Corti [[id number]]  Attending Physician: Cathleen Corti [[id number]]  Certification:: I certify this patient will need inpatient services  for at least 2 midnights  Estimated Length of Stay: 2          B Medical/Surgery History Past Medical History:  Diagnosis Date   Anxiety    Benign essential tremor    Bipolar 2 disorder (HCC)    followed by [facility]--- dr s. Lolly Mustache   Bladder cancer [facility])    recurrent   CAD (coronary artery disease)    cardiac cath 2003  and 2011 both showed normal coronary arteries w/ preserved lvf;  Non obstructive on CTA Oct 2019.    Chronic pain syndrome    back---- [facility] [place]   Cold extremities    BLE   COPD (chronic obstructive pulmonary disease) (HCC)    DDD (degenerative disc disease), lumbar    Diabetic peripheral neuropathy (HCC)    Gastroparesis    followed by dr Marina Goodell   GERD (gastroesophageal reflux disease)    Hiatal hernia    History of bladder cancer urologist-  previously dr Ronal Fear;  now dr gay   papillay TCC (Ta G1)  s/p TURBT and chemo instillation 2014   History of chest pain 12/2017   heart cath normal   History of encephalopathy 05/27/2015   admission w/ acute encephalopathy thought to be secondary to pain meds and COPD   History of gastric ulcer    History of Helicobacter pylori infection    History of kidney stones    History of TIA (transient ischemic attack) 2008  and 10-19-2018    no residual's   History of traumatic head injury 2010   w/ LOC  per pt needed stitches, hit in head with a mower blade  Hyperlipidemia    Hypertension    Hypogonadism male    s/p  bilateral orchiectomy   Hypothyroidism    Insomnia    Mild obstructive sleep apnea    study in epic 12-04-2016, no cpap   PTSD (post-traumatic stress disorder)    chronic   PTSD (post-traumatic stress disorder)    RA (rheumatoid arthritis) (HCC)    followed by [facility]   Seizures, transient [facility]) neurologist-  dr Terrace Arabia--  differential dx complex partial seizure .vs.  mood disorder .vs.  pseudoseizure--  negative EEG's   confusion episodes and staring spells  since 11/ 2015   (03-26-2020 per pt wife last seizure 10 /2021)   Transient confusion NEUOROLOGIST-  DR Terrace Arabia   Episodes since 11/ 2015--  neurologist dx  differential complex partial seizure  .vs. mood disorder . vs. pseudoseizure   Type 2 diabetes mellitus treated with insulin [facility])    endocrinologist--- dr Everardo All---  (03-26-2020 pt does not check blood sugar at home)   Past Surgical History:  Procedure Laterality Date   AMPUTATION Left 04/28/2020   Procedure: LEFT LITTLE FINGER AMPUTATION;  Surgeon: Nadara Mustard, MD;  Location: [facility] OR;  Service: Orthopedics;  Laterality: Left;   CARDIAC CATHETERIZATION  12-27-2001  DR Jacinto Halim  &  05-26-2009  DR Eldridge Dace   RESULTS FOR BOTH ARE NORMAL CORONARIES AND PERSERVED LVF/ EF 60%   CARPAL TUNNEL RELEASE Bilateral right 09-16-2003;  left ?   CARPAL TUNNEL RELEASE Left 02/25/2015   Procedure: LEFT CARPAL TUNNEL RELEASE;  Surgeon: Betha Loa, MD;  Location: [facility];  Service: Orthopedics;  Laterality: Left;   CYSTOSCOPY N/A 10/10/2012   Procedure: CYSTOSCOPY CLOT EVACUATION FULGERATION OF BLEEDERS ;  Surgeon: Garnett Farm, MD;  Location: [facility];  Service: Urology;  Laterality: N/A;   CYSTOSCOPY WITH BIOPSY N/A 11/26/2015   Procedure: CYSTOSCOPY WITH BIOPSY AND FULGURATION;  Surgeon: Ihor Gully, MD;  Location[facility];  Service: Urology;  Laterality: N/A;   ESOPHAGOGASTRODUODENOSCOPY  2014   LAPAROSCOPIC CHOLECYSTECTOMY  11-17-2010   ORCHIECTOMY Right 02/21/2016   Procedure: SCROTAL ORCHIECTOMY with TESTICULAR PROSTHESIS IMPLANT;  Surgeon: Ihor Gully, MD;  Location: [facility];  Service: Urology;  Laterality: Right;   ORCHIECTOMY Left 09/02/2018   Procedure: ORCHIECTOMY;  Surgeon: Ihor Gully, MD;  Location: [facility];  Service: Urology;  Laterality: Left;   ROTATOR CUFF REPAIR Right 12/2004   TRANSURETHRAL RESECTION OF BLADDER TUMOR N/A 08/09/2012   Procedure:  TRANSURETHRAL RESECTION OF BLADDER TUMOR (TURBT) WITH GYRUS WITH MITOMYCIN C;  Surgeon: Garnett Farm, MD;  Location: [facility];  Service: Urology;  Laterality: N/A;   TRANSURETHRAL RESECTION OF BLADDER TUMOR N/A 03/29/2020   Procedure: TRANSURETHRAL RESECTION OF BLADDER TUMOR (TURBT) and post-op instillation of gemcitabine;  Surgeon: [name], MD;  Location: [facility];  Service: Urology;  Laterality: N/A;   TRANSURETHRAL RESECTION OF BLADDER TUMOR WITH GYRUS (TURBT-GYRUS) N/A 02/27/2014   Procedure: TRANSURETHRAL RESECTION OF BLADDER TUMOR WITH GYRUS (TURBT-GYRUS);  Surgeon: Garnett Farm, MD;  Location: [facility];  Service: Urology;  Laterality: N/A;     A IV Location/Drains/Wounds Patient Lines/Drains/Airways Status     Active Line/Drains/Airways     Name Placement date Placement time Site Days   Peripheral IV 09/24/22 18 G Anterior;Proximal;Right Forearm 09/24/22  1648  Forearm  less than 1   Wound / Incision (Open or Dehisced) Finger (Comment which  one) Right;Other (Comment) opened non healing wound between index and thumb of right hand --  --  Finger (Comment which one)  --            Intake/Output Last 24 hours  Intake/Output Summary (Last 24 hours) at 09/24/2022 2142 Last data filed at 09/24/2022 2045 Gross per 24 hour  Intake 400 ml  Output --  Net 400 ml    Labs/Imaging Results for orders placed or performed during the hospital encounter of 09/24/22 (from the past 48 hour(s))  Protime-INR     Status: None   Collection Time: 09/24/22  3:55 PM  Result Value Ref Range   Prothrombin Time 12.8 11.4 - 15.2 seconds   INR 0.9 0.8 - 1.2    Comment: (NOTE) INR goal varies based on device and disease states. Performed at [facility], [address], [place], [place] [address]   APTT     Status: None   Collection Time: 09/24/22  3:55 PM  Result Value Ref Range   aPTT 30 24 - 36 seconds    Comment: Performed  at [facility], [address], [place], [place] [address]  CBC     Status: Abnormal   Collection Time: 09/24/22  3:55 PM  Result Value Ref Range   WBC 7.6 4.0 - 10.5 K/uL   RBC 4.69 4.22 - 5.81 MIL/uL   Hemoglobin 12.6 (L) 13.0 - 17.0 g/dL   HCT 18.8 (L) 41.6 - 60.6 %   MCV 79.7 (L) 80.0 - 100.0 fL   MCH 26.9 26.0 - 34.0 pg   MCHC 33.7 30.0 - 36.0 g/dL   RDW 30.1 60.1 - 09.3 %   Platelets 328 150 - 400 K/uL   nRBC 0.0 0.0 - 0.2 %    Comment: Performed at [facility], [address], [place], [place] [address]  Differential     Status: Abnormal   Collection Time: 09/24/22  3:55 PM  Result Value Ref Range   Neutrophils Relative % 47 %   Neutro Abs 3.6 1.7 - 7.7 K/uL   Lymphocytes Relative 39 %   Lymphs Abs 3.0 0.7 - 4.0 K/uL   Monocytes Relative 9 %   Monocytes Absolute 0.7 0.1 - 1.0 K/uL   Eosinophils Relative 2 %   Eosinophils Absolute 0.1 0.0 - 0.5 K/uL   Basophils Relative 1 %   Basophils Absolute 0.1 0.0 - 0.1 K/uL   Immature Granulocytes 2 %   Abs Immature Granulocytes 0.12 (H) 0.00 - 0.07 K/uL    Comment: Performed at [facility] Lab, [address], [place], [place] [address]  Comprehensive metabolic panel     Status: Abnormal   Collection Time: 09/24/22  3:55 PM  Result Value Ref Range   Sodium 139 135 - 145 mmol/L   Potassium 4.0 3.5 - 5.1 mmol/L   Chloride 105 98 - 111 mmol/L   CO2 20 (L) 22 - 32 mmol/L   Glucose, Bld 127 (H) 70 - 99 mg/dL    Comment: Glucose reference range applies only to samples taken after fasting for at least 8 hours.   BUN 20 6 - 20 mg/dL   Creatinine, Ser 5.42 (H) 0.61 - 1.24 mg/dL   Calcium 9.3 8.9 - 70.6 mg/dL   Total Protein 6.3 (L) 6.5 - 8.1 g/dL   Albumin 3.2 (L) 3.5 - 5.0 g/dL   AST 20 15 - 41 U/L   ALT 16 0 - 44 U/L  Alkaline Phosphatase 72 38 - 126 U/L   Total Bilirubin 0.6 0.3 - 1.2 mg/dL   GFR, Estimated 56 (L) >60 mL/min    Comment: (NOTE) Calculated using the CKD-EPI Creatinine Equation (2021)     Anion gap 14 5 - 15    Comment: Performed at [facility], [address], [place], [place] [address]  Ethanol     Status: None   Collection Time: 09/24/22  3:55 PM  Result Value Ref Range   Alcohol, Ethyl (B) <10 <10 mg/dL    Comment: (NOTE) Lowest detectable limit for serum alcohol is 10 mg/dL.  For medical purposes only. Performed at [facility], [address] [place], [place] [address]   CBG monitoring, ED     Status: Abnormal   Collection Time: 09/24/22  3:57 PM  Result Value Ref Range   Glucose-Capillary 119 (H) 70 - 99 mg/dL    Comment: Glucose reference range applies only to samples taken after fasting for at least 8 hours.   Comment 1 Notify RN    Comment 2 Document in Chart   I-stat chem 8, ED     Status: Abnormal   Collection Time: 09/24/22  4:04 PM  Result Value Ref Range   Sodium 139 135 - 145 mmol/L   Potassium 4.0 3.5 - 5.1 mmol/L   Chloride 108 98 - 111 mmol/L   BUN 24 (H) 6 - 20 mg/dL   Creatinine, Ser 0.98 (H) 0.61 - 1.24 mg/dL   Glucose, Bld 119 (H) 70 - 99 mg/dL    Comment: Glucose reference range applies only to samples taken after fasting for at least 8 hours.   Calcium, Ion 1.15 1.15 - 1.40 mmol/L   TCO2 22 22 - 32 mmol/L   Hemoglobin 11.6 (L) 13.0 - 17.0 g/dL   HCT 14.7 (L) 82.9 - 56.2 %  Urinalysis, Routine w reflex microscopic -Urine, Clean Catch     Status: Abnormal   Collection Time: 09/24/22  5:29 PM  Result Value Ref Range   Color, Urine YELLOW YELLOW   APPearance CLOUDY (A) CLEAR   Specific Gravity, Urine 1.027 1.005 - 1.030   pH 6.0 5.0 - 8.0   Glucose, UA >=500 (A) NEGATIVE mg/dL   Hgb urine dipstick SMALL (A) NEGATIVE   Bilirubin Urine NEGATIVE NEGATIVE   Ketones, ur NEGATIVE NEGATIVE mg/dL   Protein, ur 130 (A) NEGATIVE mg/dL   Nitrite NEGATIVE NEGATIVE   Leukocytes,Ua LARGE (A) NEGATIVE   RBC / HPF 6-10 0 - 5 RBC/hpf   WBC, UA >50 0 - 5 WBC/hpf   Bacteria, UA RARE (A) NONE SEEN   Squamous Epithelial / HPF 0-5 0 - 5  /HPF   WBC Clumps PRESENT    Mucus PRESENT    Hyaline Casts, UA PRESENT     Comment: Performed at [facility], [address], [place], [place] [address]  Rapid urine drug screen (hospital performed)     Status: None   Collection Time: 09/24/22  5:29 PM  Result Value Ref Range   Opiates NONE DETECTED NONE DETECTED   Cocaine NONE DETECTED NONE DETECTED   Benzodiazepines NONE DETECTED NONE DETECTED   Amphetamines NONE DETECTED NONE DETECTED   Tetrahydrocannabinol NONE DETECTED NONE DETECTED   Barbiturates NONE DETECTED NONE DETECTED    Comment: (NOTE) DRUG SCREEN FOR MEDICAL PURPOSES ONLY.  IF CONFIRMATION IS NEEDED FOR ANY PURPOSE, NOTIFY LAB WITHIN 5 DAYS.  LOWEST DETECTABLE LIMITS FOR URINE DRUG SCREEN  Drug Class                     Cutoff (ng/mL) Amphetamine and metabolites    1000 Barbiturate and metabolites    200 Benzodiazepine                 200 Opiates and metabolites        300 Cocaine and metabolites        300 THC                            50 Performed at [facility], [address] [place], [place] [address]   Lactic acid, plasma     Status: None   Collection Time: 09/24/22  5:42 PM  Result Value Ref Range   Lactic Acid, Venous 1.6 0.5 - 1.9 mmol/L    Comment: Performed at [facility], [address], [place], [place] [address]   CT ANGIO HEAD NECK W WO CM  Result Date: 09/24/2022 CLINICAL DATA:  Neuro deficit, acute, stroke suspected. EXAM: CT ANGIOGRAPHY HEAD AND NECK WITH AND WITHOUT CONTRAST TECHNIQUE: Multidetector CT imaging of the head and neck was performed using the standard protocol during bolus administration of intravenous contrast. Multiplanar CT image reconstructions and MIPs were obtained to evaluate the vascular anatomy. Carotid stenosis measurements (when applicable) are obtained utilizing NASCET criteria, using the distal internal carotid diameter as the denominator. RADIATION DOSE REDUCTION: This exam was performed  according to the departmental dose-optimization program which includes automated exposure control, adjustment of the mA and/or kV according to patient size and/or use of iterative reconstruction technique. CONTRAST:  OMNIPAQUE IOHEXOL 350 MG/ML SOLN COMPARISON:  CT angio head and neck 06/10/2020 FINDINGS: CTA NECK FINDINGS Aortic arch: Atherosclerotic calcifications are present at the aortic arch and great vessel origins. No significant stenosis of greater than 50% is present relative to the more distal vessels. Right carotid system: Atherosclerotic changes are noted along the right common carotid artery without significant stenosis. Calcifications are present at the aortic arch and proximal right ICA without significant stenosis. The more distal right ICA is normal. No significant interval change is present. Left carotid system: The left common carotid artery demonstrates atherosclerotic changes without significant stenosis. Calcifications are present at the aortic arch and proximal left ICA without a significant stenosis relative to the more distal vessel. Vertebral arteries: The right vertebral artery is the dominant vessel. Atherosclerotic calcifications are present in the V1 segment. No significant stenosis is present in either vertebral artery in the neck. Skeleton: No acute abnormalities are present. No focal osseous lesions are present. The patient is edentulous. Other neck: Soft tissues the neck are otherwise unremarkable. Salivary glands are within normal limits. Thyroid is normal. No significant adenopathy is present. No focal mucosal or submucosal lesions are present. Upper chest: The lung apices are clear. The thoracic inlet is within normal limits. Review of the MIP images confirms the above findings CTA HEAD FINDINGS Anterior circulation: Atherosclerotic calcifications are present within the cavernous internal carotid arteries bilaterally without a significant stenosis relative to the more  distal vessels. The A1 and M1 segments are normal. The anterior communicating artery is patent. MCA bifurcations are within normal limits. ACA and MCA branch vessels are normal. No aneurysm is present. Posterior circulation: The right vertebral artery is the dominant vessel. PICA origin is visualized and normal. Scratched at the PICA origins are visualized and normal. The  vertebrobasilar junction and basilar artery normal. Both posterior cerebral arteries scratched at the superior cerebellar arteries are patent. Both posterior cerebral arteries originate from the basilar tip. The PCA branch vessels are normal bilaterally. Venous sinuses: The dural sinuses are patent. The straight sinus and deep cerebral veins are intact. Cortical veins are within normal limits. No significant vascular malformation is evident. Review of the MIP images confirms the above findings IMPRESSION: 1. No significant proximal stenosis, aneurysm, or branch vessel occlusion within the Circle of Willis. 2. Stable atherosclerotic calcifications within the cavernous internal carotid arteries without significant stenosis. 3. Stable atherosclerotic changes within the cavernous internal carotid arteries bilaterally without significant stenosis relative to the more distal vessels. 4.  Aortic Atherosclerosis (ICD10-I70.0). Electronically Signed   By: Marin Roberts M.D.   On: 09/24/2022 17:35   CT Angio Chest Aorta W and/or Wo Contrast  Result Date: 09/24/2022 CLINICAL DATA:  Neuro deficit, acute aortic syndrome suspected EXAM: CT ANGIOGRAPHY CHEST, ABDOMEN AND PELVIS TECHNIQUE: Non-contrast CT of the chest was initially obtained. Multidetector CT imaging through the chest, abdomen and pelvis was performed using the standard protocol during bolus administration of intravenous contrast. Multiplanar reconstructed images and MIPs were obtained and reviewed to evaluate the vascular anatomy. RADIATION DOSE REDUCTION: This exam was performed  according to the departmental dose-optimization program which includes automated exposure control, adjustment of the mA and/or kV according to patient size and/or use of iterative reconstruction technique. CONTRAST:  OMNIPAQUE IOHEXOL 350 MG/ML SOLN COMPARISON:  Chest radiograph 08/25/2022; CT abdomen and pelvis 06/21/2022; CTA chest abdomen and pelvis 06/10/2020 FINDINGS: CTA CHEST FINDINGS Cardiovascular: Normal heart size. No pericardial effusion. No acute aortic syndrome. Aortic atherosclerotic and coronary artery atherosclerotic calcification. Mediastinum/Nodes: Unremarkable esophagus. Secretions in the posterior trachea near the thoracic inlet. No thoracic adenopathy. Lungs/Pleura: Lungs are clear. No pleural effusion or pneumothorax. Mild diffuse bronchial wall thickening. Mild mosaic attenuation of the lungs compatible with air trapping. Musculoskeletal: No chest wall abnormality. No acute or significant osseous findings. Review of the MIP images confirms the above findings. CTA ABDOMEN AND PELVIS FINDINGS VASCULAR Aorta: Moderate calcified plaque causes up to mild narrowing near the bifurcation. No aneurysm or dissection. Celiac: Patent without aneurysm or dissection. SMA: Patent without aneurysm or dissection. Renals: Calcified plaque at the origin of the right renal artery causes moderate narrowing. Patent left renal artery. No aneurysm or dissection IMA: Patent. Inflow: Scattered calcified atherosclerotic plaque causing multifocal areas of mild narrowing. No aneurysm or dissection. Veins: Patent portal vein. Review of the MIP images confirms the above findings. NON-VASCULAR Hepatobiliary: Cholecystectomy. Hepatic steatosis. No biliary dilation. Pancreas: Unremarkable. No pancreatic ductal dilatation or surrounding inflammatory changes. Spleen: Normal in size without focal abnormality. Adrenals/Urinary Tract: Unremarkable adrenal glands and kidneys. No hydronephrosis or urinary calculi. Unremarkable  bladder. Stomach/Bowel: Stomach is within normal limits. Normal caliber large and small bowel. Moderate colonic stool load. Normal appendix. Lymphatic: No lymphadenopathy. Reproductive: No acute abnormality. Other: Subcutaneous peripherally enhancing gas and fluid collection along the right posterior gluteal cleft measuring 3.5 x 2.3 cm (6/344). Mild adjacent stranding. No free intraperitoneal fluid or air. Musculoskeletal: No acute osseous abnormality. Review of the MIP images confirms the above findings. IMPRESSION: 1. No acute aortic syndrome. 2. Subcutaneous peripherally enhancing gas and fluid collection along the right posterior gluteal cleft measuring 3.5 x 2.3 cm, concerning for abscess. 3. Diffuse bronchial wall thickening. Layering secretions in the trachea near the thoracic inlet. Query aspiration. 4. Hepatic steatosis. 5. Moderate stool load.  Correlate  for constipation. Aortic Atherosclerosis (ICD10-I70.0). Electronically Signed   By: Minerva Fester M.D.   On: 09/24/2022 17:27   CT HEAD CODE STROKE WO CONTRAST  Result Date: 09/24/2022 CLINICAL DATA:  Code stroke. Neuro deficit, acute, stroke suspected. EXAM: CT HEAD WITHOUT CONTRAST TECHNIQUE: Contiguous axial images were obtained from the base of the skull through the vertex without intravenous contrast. RADIATION DOSE REDUCTION: This exam was performed according to the departmental dose-optimization program which includes automated exposure control, adjustment of the mA and/or kV according to patient size and/or use of iterative reconstruction technique. COMPARISON:  CT head without contrast 07/18/2022. MR head without contrast 08/27/2022 FINDINGS: Brain: No acute infarct, hemorrhage, or mass lesion is present. No significant white matter lesions are present. Deep brain nuclei are within normal limits. Insular ribbon is normal bilaterally. The ventricles are of normal size. No significant extraaxial fluid collection is present. The brainstem and  cerebellum are within normal limits. Midline structures are within normal limits. Vascular: Atherosclerotic calcifications are present within the cavernous internal carotid arteries bilaterally. No hyperdense vessel is present. Skull: Calvarium is intact. No focal lytic or blastic lesions are present. No significant extracranial soft tissue lesion is present. Sinuses/Orbits: The paranasal sinuses and mastoid air cells are clear. The globes and orbits are within normal limits. ASPECTS [facility] Stroke Program Early CT Score) - Ganglionic level infarction (caudate, lentiform nuclei, internal capsule, insula, M1-M3 cortex): 7/7 - Supraganglionic infarction (M4-M6 cortex): 3/3 Total score (0-10 with 10 being normal): 10/10 IMPRESSION: 1. Negative CT of the head. 2. Aspects is 10/10. The above was relayed via text pager to Dr. Otelia Limes on 09/24/2022 at 16:15 . Electronically Signed   By: Marin Roberts M.D.   On: 09/24/2022 16:17    Pending Labs Unresulted Labs (From admission, onward)     Start     Ordered   09/25/22 0500  Comprehensive metabolic panel  Tomorrow morning,   R        09/24/22 2031   09/25/22 0500  CBC  Tomorrow morning,   R        09/24/22 2031   09/25/22 0500  Lipid panel  (Labs)  Tomorrow morning,   R       Comments: Fasting    09/24/22 2035   09/24/22 1742  Blood culture (routine x 2)  BLOOD CULTURE X 2,   R (with STAT occurrences)      09/24/22 1741            Vitals/Pain Today's Vitals   09/24/22 2044 09/24/22 2045 09/24/22 2130 09/24/22 2134  BP:  136/80 (!) 142/73   Pulse:  84 85   Resp:  18 16   Temp: (!) 96.8 F (36 C)     TempSrc: Temporal     SpO2:  99% 100%   Weight:      Height:      PainSc:    0-No pain    Isolation Precautions No active isolations  Medications Medications  sodium chloride flush (NS) 0.9 % injection 3 mL (3 mLs Intravenous Not Given 09/24/22 1648)  acetaminophen (TYLENOL) tablet 650 mg (has no administration in time range)   hydroxychloroquine (PLAQUENIL) tablet 200 mg (200 mg Oral Given 09/24/22 2140)  rosuvastatin (CRESTOR) tablet 10 mg (10 mg Oral Given 09/24/22 2138)  DULoxetine (CYMBALTA) DR capsule 60 mg (has no administration in time range)  lurasidone (LATUDA) tablet 40 mg (40 mg Oral Given 09/24/22 2140)  venlafaxine XR (EFFEXOR-XR) 24 hr capsule 150 mg (  has no administration in time range)  pantoprazole (PROTONIX) EC tablet 40 mg (has no administration in time range)  lamoTRIgine (LAMICTAL) tablet 150 mg (150 mg Oral Given 09/24/22 2139)  pregabalin (LYRICA) capsule 200 mg (200 mg Oral Given 09/24/22 2138)   stroke: early stages of recovery book (has no administration in time range)  insulin aspart (novoLOG) injection 0-15 Units (has no administration in time range)  lactated ringers bolus 2,000 mL (0 mLs Intravenous Stopped 09/24/22 1836)  iohexol (OMNIPAQUE) 350 MG/ML injection 100 mL (100 mLs Intravenous Contrast Given 09/24/22 1635)  lidocaine-EPINEPHrine (XYLOCAINE W/EPI) 2 %-1:200000 (PF) injection 20 mL (20 mLs Intradermal Given 09/24/22 1836)  ceFEPIme (MAXIPIME) 2 g in sodium chloride 0.9 % 100 mL IVPB (0 g Intravenous Stopped 09/24/22 1906)  vancomycin (VANCOREADY) IVPB 2000 mg/400 mL (0 mg Intravenous Stopped 09/24/22 2045)    Mobility walks     Focused Assessments    R Recommendations: See Admitting Provider Note  Report given to:   Additional Notes:

## 2022-09-24 NOTE — Consult Note (Signed)
NEURO HOSPITALIST CONSULT NOTE   Requestig physician: Dr. Doran Durand  Reason for Consult: Acute onset of left sided weakness and confusion progressing to diffuse weakness in the setting of hypotension and diaphoresis  History obtained from:  EMS and Chart     HPI:                                                                                                                                          Vincent [name] is a 57 y.o. male with a PMHx of DM, HLD, HTN, hypothyroidism, hypogonadism s/p bilateral orchiectomy, chronic pain, TIA, transient seizures (etiology unknown with differential at the time including complex partial seizure, mood disorder and PNES), traumatic head injury after being struck by a mower blade in 2010, bipolar disorder, chronic pain syndrome, diabetic peripheral neuropathy, PTSD, CAD, COPD, OSA, GERD, history of bladder cancer/gastroparesis who presents to the ED via EMS as a Code Stroke with AMS, hypotension, diaphoresis with cool and clammy skin, confusion, diffuse weakness and chest pressure sensation with subjective difficulty breathing. He was last seen normal at 11 AM.  He was visiting his ex-spouse and left her house 11 AM.  He was then found down with altered mental status at 4:30 PM by his current spouse. Initially encoded with a LKN of 11 AM that was then clarified as being at 11 AM.  At baseline he has normal functional status.  In the ED he was found to have reduced movement on the left side, left-sided facial droop and aphasia.  Of note patient recently had a pilonidal abscess drained with a course of p.o. antibiotics.  He presented very similarly 1 month ago with altered mental status and hypotension and was diagnosed at that time with polypharmacy. He was catatonic-appearing on arrival, with eyes open staring straight forwards, mouth open and no movement of extremities.   He was rushed to CT where he became more diaphoretic but did start  responding to questions with brief verbal answers. He also has an admission in 2017 with AMS thought to be secondary to pain medications and COPD at that time.   Past Medical History:  Diagnosis Date   Anxiety    Benign essential tremor    Bipolar 2 disorder [facility])    followed by [facility]--- dr s. Lolly Mustache   Bladder cancer [facility])    recurrent   CAD (coronary artery disease)    cardiac cath 2003  and 2011 both showed normal coronary arteries w/ preserved lvf;  Non obstructive on CTA Oct 2019.    Chronic pain syndrome    back---- [facility] [place]   Cold extremities    BLE   COPD (chronic obstructive pulmonary disease) (HCC)    DDD (degenerative disc disease), lumbar  Diabetic peripheral neuropathy (HCC)    Gastroparesis    followed by dr Marina Goodell   GERD (gastroesophageal reflux disease)    Hiatal hernia    History of bladder cancer urologist-  previously dr Ronal Fear;  now dr gay   papillay TCC (Ta G1)  s/p TURBT and chemo instillation 2014   History of chest pain 12/2017   heart cath normal   History of encephalopathy 05/27/2015   admission w/ acute encephalopathy thought to be secondary to pain meds and COPD   History of gastric ulcer    History of Helicobacter pylori infection    History of kidney stones    History of TIA (transient ischemic attack) 2008  and 10-19-2018    no residual's   History of traumatic head injury 2010   w/ LOC  per pt needed stitches, hit in head with a mower blade   Hyperlipidemia    Hypertension    Hypogonadism male    s/p  bilateral orchiectomy   Hypothyroidism    Insomnia    Mild obstructive sleep apnea    study in epic 12-04-2016, no cpap   PTSD (post-traumatic stress disorder)    chronic   PTSD (post-traumatic stress disorder)    RA (rheumatoid arthritis) (HCC)    followed by [facility]   Seizures, transient [facility]) neurologist-  dr Terrace Arabia--  differential dx complex partial seizure .vs.  mood disorder .vs.   pseudoseizure--  negative EEG's   confusion episodes and staring spells since 11/ 2015   (03-26-2020 per pt wife last seizure 10 /2021)   Transient confusion NEUOROLOGIST-  DR Terrace Arabia   Episodes since 11/ 2015--  neurologist dx  differential complex partial seizure  .vs. mood disorder . vs. pseudoseizure   Type 2 diabetes mellitus treated with insulin [facility])    endocrinologist--- dr Everardo All---  (03-26-2020 pt does not check blood sugar at home)    Past Surgical History:  Procedure Laterality Date   AMPUTATION Left 04/28/2020   Procedure: LEFT LITTLE FINGER AMPUTATION;  Surgeon: Nadara Mustard, MD;  Location: [facility] OR;  Service: Orthopedics;  Laterality: Left;   CARDIAC CATHETERIZATION  12-27-2001  DR Jacinto Halim  &  05-26-2009  DR Eldridge Dace   RESULTS FOR BOTH ARE NORMAL CORONARIES AND PERSERVED LVF/ EF 60%   CARPAL TUNNEL RELEASE Bilateral right 09-16-2003;  left ?   CARPAL TUNNEL RELEASE Left 02/25/2015   Procedure: LEFT CARPAL TUNNEL RELEASE;  Surgeon: Betha Loa, MD;  Location: [facility];  Service: Orthopedics;  Laterality: Left;   CYSTOSCOPY N/A 10/10/2012   Procedure: CYSTOSCOPY CLOT EVACUATION FULGERATION OF BLEEDERS ;  Surgeon: Garnett Farm, MD;  Location: [facility];  Service: Urology;  Laterality: N/A;   CYSTOSCOPY WITH BIOPSY N/A 11/26/2015   Procedure: CYSTOSCOPY WITH BIOPSY AND FULGURATION;  Surgeon: Ihor Gully, MD;  Location: [facility];  Service: Urology;  Laterality: N/A;   ESOPHAGOGASTRODUODENOSCOPY  2014   LAPAROSCOPIC CHOLECYSTECTOMY  11-17-2010   ORCHIECTOMY Right 02/21/2016   Procedure: SCROTAL ORCHIECTOMY with TESTICULAR PROSTHESIS IMPLANT;  Surgeon: Ihor Gully, MD;  Location: [facility];  Service: Urology;  Laterality: Right;   ORCHIECTOMY Left 09/02/2018   Procedure: ORCHIECTOMY;  Surgeon: Ihor Gully, MD;  Location: [facility];  Service: Urology;  Laterality: Left;   ROTATOR CUFF REPAIR Right  12/2004   TRANSURETHRAL RESECTION OF BLADDER TUMOR N/A 08/09/2012   Procedure: TRANSURETHRAL RESECTION OF BLADDER TUMOR (TURBT) WITH GYRUS WITH MITOMYCIN C;  Surgeon:  Garnett Farm, MD;  Location: [facility];  Service: Urology;  Laterality: N/A;   TRANSURETHRAL RESECTION OF BLADDER TUMOR N/A 03/29/2020   Procedure: TRANSURETHRAL RESECTION OF BLADDER TUMOR (TURBT) and post-op instillation of gemcitabine;  Surgeon: [name], MD;  Location[facility];  Service: Urology;  Laterality: N/A;   TRANSURETHRAL RESECTION OF BLADDER TUMOR WITH GYRUS (TURBT-GYRUS) N/A 02/27/2014   Procedure: TRANSURETHRAL RESECTION OF BLADDER TUMOR WITH GYRUS (TURBT-GYRUS);  Surgeon: Garnett Farm, MD;  Location: [facility];  Service: Urology;  Laterality: N/A;    Family History  Problem Relation Age of Onset   Diabetes Mother    Diabetes Father    Hypertension Father    Heart attack Father 79       died age 31   Alcohol abuse Father    Colon cancer Neg Hx    Esophageal cancer Neg Hx    Stomach cancer Neg Hx    Rectal cancer Neg Hx              Social History:  reports that he has been smoking cigarettes. He has a 57 pack-year smoking history. He has never used smokeless tobacco. He reports that he does not drink alcohol and does not use drugs.  Allergies  Allergen Reactions   Celecoxib Anaphylaxis, Swelling and Rash    Tongue swells   Hydrocodone Rash and Other (See Comments)    "blisters developed on arms"    Sulfa Antibiotics Rash   Sulfacetamide Sodium Rash    HOME MEDICATIONS:                                                                                                                     No current facility-administered medications on file prior to encounter.   Current Outpatient Medications on File Prior to Encounter  Medication Sig Dispense Refill   acetaminophen (TYLENOL) 325 MG tablet Take 2 tablets (650 mg total) by mouth every 6 (six)  hours as needed for mild pain (or Fever >/= 101). 20 tablet 0   baclofen (LIORESAL) 10 MG tablet Take 10 mg by mouth 3 (three) times daily. Sister only gives it to Patient at Bedtime.     clopidogrel (PLAVIX) 75 MG tablet Take 75 mg by mouth daily. Wife called and spoke with nurse.  This is a medication the patient takes at home that was not entered at admission. Dr. Sibyl Parr informed.     DULoxetine (CYMBALTA) 60 MG capsule Take 60 mg by mouth daily. Wife called and spoke with nurse.  This is a medication the patient takes at home that was not entered at admission. Dr. Sibyl Parr informed.     HUMALOG KWIKPEN 200 UNIT/ML KwikPen Inject 20 Units into the skin in the morning, at noon, and at bedtime. (Patient taking differently: Inject 35 Units into the skin in the morning, at noon, and at bedtime. Sister stated Dr. Catalina Pizza her 35 units on both Insulin Pens)  hydroxychloroquine (PLAQUENIL) 200 MG tablet Take 200 mg by mouth 2 (two) times daily. Wife called and spoke with nurse.  This is a medication the patient takes at home that was not entered at admission. Dr. Sibyl Parr informed.     hydrOXYzine (ATARAX) 25 MG tablet Take 25 mg by mouth at bedtime as needed for anxiety or itching.     insulin glargine, 2 Unit Dial, (TOUJEO MAX SOLOSTAR) 300 UNIT/ML Solostar Pen Inject 40 Units into the skin 2 (two) times daily at 8 am and 10 pm. (Patient taking differently: Inject 35 Units into the skin 2 (two) times daily at 8 am and 10 pm. Sister stated Dr. Has Patient using 35 units on both insulin pens.)     lamoTRIgine (LAMICTAL) 150 MG tablet Take 150 mg by mouth 2 (two) times daily. Wife called and spoke with nurse.  This is a medication the patient takes at home that was not entered at admission. Dr. Sibyl Parr informed.     lurasidone (LATUDA) 20 MG TABS tablet Take 40 mg by mouth at bedtime.     omeprazole (PRILOSEC) 40 MG capsule Take 40 mg by mouth daily.     pregabalin (LYRICA) 200 MG capsule Take 200 mg by mouth 3 (three)  times daily.     rOPINIRole (REQUIP) 0.5 MG tablet Take 0.5 mg by mouth at bedtime.     rosuvastatin (CRESTOR) 10 MG tablet Take 1 tablet (10 mg total) by mouth daily. (Patient taking differently: Take 10 mg by mouth at bedtime.) 30 tablet 0   tamsulosin (FLOMAX) 0.4 MG CAPS capsule Take 0.4 mg by mouth at bedtime.     venlafaxine XR (EFFEXOR-XR) 150 MG 24 hr capsule Take 150 mg by mouth daily with breakfast. Wife called and spoke with nurse.  This is a medication the patient takes at home that was not entered at admission. Dr. Sibyl Parr informed.     nicotine (NICODERM CQ - DOSED IN MG/24 HOURS) 21 mg/24hr patch Place 1 patch (21 mg total) onto the skin daily. 28 patch 0     ROS:                                                                                                                                       Unable to obtain due to mutism versus poor cooperation.   BP 94/60, HR 79, RR 18, O2 Sat 98% RA, Temp 96.6 . Height 6' (1.829 m), weight 89.4 kg.   General Examination:  Physical Exam  HEENT:  [place]/AT. Skin to face and scalp is pale, cool; and clammy.  Lungs: Respirations unlabored but states he has difficulty breathing Extremities: Pale, cool and clammy upper extremities and lower extremities to lower thighs. Below knees skin is dry, cool and pale.    Neurological Examination Mental Status: Eyes open and nonverbal with no spontaneous movement other than slight mouth opening and closing as though attempting to speak. Initially not following commands, in CT he began responding to commands and started to speak in short, fluent, hypophonic phrases in answer to questions regarding his symptoms.  Cranial Nerves: II: Pupils 3 mm and sluggishly reactive. Inconsistent blink to threat. He stated "both" when asked if he could see both hands moving simultaneously in bilateral visual fields after CT  was completed, otherwise not cooperative with multiple initial attempts to assess visual fields.  III,IV, VI: No ptosis. Eyes conjugate at the midline. No nystagmus. Did briefly gaze to left and right after initially remaining motionless.   V: States initially that he has no feeling to fine and coarse touch stimuli bilaterally. At end of exam he then indicated bilateral diminished sensation when retested.  VII: Does not smile to command. Face flaccidly symmetric with mouth held partially open.  VIII: Hearing intact to voice.  IX,X: Hypophonic speech. Gag reflex deferred due to concern for possible aspiration.  XI: Head is midline. XII: Weakly extends tongue midline  Motor: Decreased tone x 4 symmetrically.. No posturing, tremor or jerking movements. Does not elevate any of his extremities antigravity to command. Weakly wiggles fingers and toes to command.  Sensory: States he is insensate to all pinch stimuli to left lower leg, foot and thigh. Subjectively diminished sensation to RLE to pinch throughout, except no sensation to right foot.  Diminished but present sensation to pinch in BUE less on the left.  Deep Tendon Reflexes: Hypoactive throughout Cerebellar: Unable to assess Gait: Unable to assess  NIHSS: 23   Lab Results: Basic Metabolic Panel: Recent Labs  Lab 09/24/22 1604  NA 139  K 4.0  CL 108  GLUCOSE 126*  BUN 24*  CREATININE 1.50*    CBC: Recent Labs  Lab 09/24/22 1555 09/24/22 1604  WBC 7.6  --   NEUTROABS 3.6  --   HGB 12.6* 11.6*  HCT 37.4* 34.0*  MCV 79.7*  --   PLT 328  --     Cardiac Enzymes: No results for input(s): "CKTOTAL", "CKMB", "CKMBINDEX", "TROPONINI" in the last 168 hours.  Lipid Panel: No results for input(s): "CHOL", "TRIG", "HDL", "CHOLHDL", "VLDL", "LDLCALC" in the last 168 hours.  Imaging: CT HEAD CODE STROKE WO CONTRAST  Result Date: 09/24/2022 CLINICAL DATA:  Code stroke. Neuro deficit, acute, stroke suspected. EXAM: CT HEAD  WITHOUT CONTRAST TECHNIQUE: Contiguous axial images were obtained from the base of the skull through the vertex without intravenous contrast. RADIATION DOSE REDUCTION: This exam was performed according to the departmental dose-optimization program which includes automated exposure control, adjustment of the mA and/or kV according to patient size and/or use of iterative reconstruction technique. COMPARISON:  CT head without contrast 07/18/2022. MR head without contrast 08/27/2022 FINDINGS: Brain: No acute infarct, hemorrhage, or mass lesion is present. No significant white matter lesions are present. Deep brain nuclei are within normal limits. Insular ribbon is normal bilaterally. The ventricles are of normal size. No significant extraaxial fluid collection is present. The brainstem and cerebellum are within normal limits. Midline structures are within normal limits. Vascular: Atherosclerotic calcifications are present  within the cavernous internal carotid arteries bilaterally. No hyperdense vessel is present. Skull: Calvarium is intact. No focal lytic or blastic lesions are present. No significant extracranial soft tissue lesion is present. Sinuses/Orbits: The paranasal sinuses and mastoid air cells are clear. The globes and orbits are within normal limits. ASPECTS [facility] Stroke Program Early CT Score) - Ganglionic level infarction (caudate, lentiform nuclei, internal capsule, insula, M1-M3 cortex): 7/7 - Supraganglionic infarction (M4-M6 cortex): 3/3 Total score (0-10 with 10 being normal): 10/10 IMPRESSION: 1. Negative CT of the head. 2. Aspects is 10/10. The above was relayed via text pager to Dr. Otelia Limes on 09/24/2022 at 16:15 . Electronically Signed   By: Marin Roberts M.D.   On: 09/24/2022 16:17     Assessment: 57 year old male presenting with acute onset of diffuse limb weakness and confusion. Code Stroke was called in the field per protocol for the slurred speech and left sided weakness perceived  by family, which caused them to call EMS. However, overall appearance to EMS was felt to be more consistent with an acute cardiac event.  - Exam reveals diffuse severe weakness of BUE and BLE as well as of trunk/abdominal muscles and neck. He can wiggle toes and fingers weakly but symmetrically. He can respond to simple questions and commands. There is no facial droop, forced gaze deviation or vision loss.  - CT head. Normal. ASPECTS 10.  - DDx: Uncertain if this is due to hypotension and fatigue in conjunction with patients' chest pressure. His skin is pale, clammy, cool and diaphoretic. He also complains of SOB. Overall suspect a cardiac etiology or aortic dissection versus other acute systemic illness as the etiology over stroke.  - CTA of head and neck is being obtained STAT along with CTA of chest.  - EKG per EDP did not show any ST elevation. NSTEMI is on the DDx. ED is ordering Troponins and other cardiac work up.  - Overlapping sepsis has been diagnosed by EDP based on hypotension, AMS and known buttock abscess.  - Of note, he was admitted last month with the same presentation which was found to be due to polypharmacy - DDx:  - The pattern of limb weakness with ability to move toes and fingers is not consistent with a cord or brain lesion, but will obtain further imaging studies to fully rule out an unusual combination of embolic strokes or vertebrobasilar insufficiency. Not a TNK candidate due to time criteria - A systemic cardiovascular process leading to hypotension and possibly STEMI or aortic dissection are felt to be significantly more likely due to his overall presentation. Sepsis and drug intoxication are also felt to be high on the DDx.   Recommendations: - CTA of head and neck in conjunction with CTA of chest.  - May need MRI brain after CTA if no dissection is seen.  - UDS - EtOH level - q30 min NIHSS and VS until 1900   Addendum: - UDS negative. - EtOH < 10 - CTA of head and  neck: No significant proximal stenosis, aneurysm, or branch vessel occlusion within the Circle of Willis. Stable atherosclerotic calcifications within the cavernous internal carotid arteries without significant stenosis. Stable atherosclerotic changes within the cavernous internal carotid arteries bilaterally without significant stenosis relative to the more distal vessels. Aortic Atherosclerosis - Not a thrombectomy candidate due to no LVO.  - CTA chest: No acute aortic syndrome. Other findings as documented in the report to be assessed for clinical significance by primary team.  -  MRI brain: No acute intracranial process. No evidence of acute or subacute infarct.  Addendum: - Per Hospitalist team, he continues to exhibit weakness of his upper and lower extremities.  - Given diffuse limb weakness and hypotension on arrival, in conjunction with negative imaging studies so far, a spinal cord lesion is now higher on the DDx. Spinal shock as the etiology of his initial presentation is now a consideration.  - STAT MRI of cervical spine has been ordered.    Electronically signed: Dr. Caryl Pina 09/24/2022, 4:20 PM

## 2022-09-24 NOTE — ED Notes (Signed)
Patient currently at MRI.  

## 2022-09-24 NOTE — ED Triage Notes (Signed)
Pt BIB EMS S CODE STROKE. Lsn 1430. DEFICITS- left sided weakness, aphasia, and left sided facial droop.

## 2022-09-24 NOTE — Code Documentation (Addendum)
Stroke Response Nurse Documentation Code Documentation  Vincent [name] is a 57 y.o. male arriving to [facility]  via [place] [facility] on 09/24/2022 with past medical hx of COPD, peripheral neuropathy, HLD, HTN, TIA, seizures. On No antithrombotic. Code stroke was activated by EMS.   Patient from home where he was LKW at 1430 and now complaining of slurred speech, left sided weakness. Pt spoke to a family member on the phone at 1430 and sounded to be in his normal state of health. He drove to the store and when he returned home his family noticed he was slurred.   Stroke team at the bedside on patient arrival. Labs drawn and patient cleared for CT by Dr. Doran Durand. Patient to CT with team.   NIHSS 23, see documentation for details and code stroke times. Patient with decreased LOC, disoriented, not following commands, bilateral arm weakness, bilateral leg weakness, pt c/o decreased sensation to his left leg and to the right side of his abdomen, expressive aphasia, and dysarthria  on exam.   The following imaging was completed:  CT Head and CTA.   Patient is not a candidate for IV Thrombolytic due to symptoms inconsistent for stroke.  Patient is not a candidate for IR due to exam and imaging negative for LVO.   Care Plan: q30 min NIHSS and VS until 1900.   Bedside handoff with ED RN Delorise Jackson.    Zacharia Sowles L Orvile Corona  Rapid Response RN

## 2022-09-24 NOTE — ED Notes (Signed)
1 [phone] Renee Blanchett - sister

## 2022-09-24 NOTE — Consult Note (Incomplete)
NEURO HOSPITALIST CONSULT NOTE   Requestig physician: Dr. Doran Durand  Reason for Consult: Acute onset of left sided weakness and confusion progressing to diffuse weakness in the setting of hypotension and diaphoresis  History obtained from:  EMS and Chart     HPI:                                                                                                                                          Vincent [name] is a 57 y.o. male with a PMHx of DM, HLD, HTN, hypothyroidism, hypogonadism s/p bilateral orchiectomy, chronic pain, TIA, transient seizures (etiology unknown with differential at the time including complex partial seizure, mood disorder and PNES), traumatic head injury after being struck by a mower blade in 2010, bipolar disorder, chronic pain syndrome, diabetic peripheral neuropathy, PTSD, CAD, COPD, OSA, GERD, history of bladder cancer/gastroparesis who presents to the ED via EMS as a Code Stroke with AMS, hypotension, diaphoresis with cool and clammy skin, confusion, diffuse weakness and chest pressure sensation with subjective difficulty breathing. He presented very similarly 1 month ago with altered mental status and hypotension and was diagnosed at that time with polypharmacy. He was catatonic-appearing on arrival, with eyes open staring straight forwards, mouth open and no movement of extremities.   He was rushed to CT where he became more diaphoretic but did start responding to questions with brief verbal answers. He also has an admission in 2017 with AMS thought to be secondary to pain medications and COPD at that time.   Past Medical History:  Diagnosis Date  . Anxiety   . Benign essential tremor   . Bipolar 2 disorder (HCC)    followed by [facility]--- dr s. Lolly Mustache  . Bladder cancer (HCC)    recurrent  . CAD (coronary artery disease)    cardiac cath 2003  and 2011 both showed normal coronary arteries w/ preserved lvf;  Non obstructive on CTA Oct 2019.   Marland Kitchen  Chronic pain syndrome    back---- [facility] [place]  . Cold extremities    BLE  . COPD (chronic obstructive pulmonary disease) (HCC)   . DDD (degenerative disc disease), lumbar   . Diabetic peripheral neuropathy (HCC)   . Gastroparesis    followed by dr Marina Goodell  . GERD (gastroesophageal reflux disease)   . Hiatal hernia   . History of bladder cancer urologist-  previously dr Ronal Fear;  now dr gay   papillay TCC (Ta G1)  s/p TURBT and chemo instillation 2014  . History of chest pain 12/2017   heart cath normal  . History of encephalopathy 05/27/2015   admission w/ acute encephalopathy thought to be secondary to pain meds and COPD  . History of gastric ulcer   . History  of Helicobacter pylori infection   . History of kidney stones   . History of TIA (transient ischemic attack) 2008  and 10-19-2018    no residual's  . History of traumatic head injury 2010   w/ LOC  per pt needed stitches, hit in head with a mower blade  . Hyperlipidemia   . Hypertension   . Hypogonadism male    s/p  bilateral orchiectomy  . Hypothyroidism   . Insomnia   . Mild obstructive sleep apnea    study in epic 12-04-2016, no cpap  . PTSD (post-traumatic stress disorder)    chronic  . PTSD (post-traumatic stress disorder)   . RA (rheumatoid arthritis) (HCC)    followed by [facility]  . Seizures, transient [facility]) neurologist-  dr Terrace Arabia--  differential dx complex partial seizure .vs.  mood disorder .vs.  pseudoseizure--  negative EEG's   confusion episodes and staring spells since 11/ 2015   (03-26-2020 per pt wife last seizure 10 /2021)  . Transient confusion NEUOROLOGIST-  DR YAN   Episodes since 11/ 2015--  neurologist dx  differential complex partial seizure  .vs. mood disorder . vs. pseudoseizure  . Type 2 diabetes mellitus treated with insulin [facility])    endocrinologist--- dr Everardo All---  (03-26-2020 pt does not check blood sugar at home)    Past Surgical History:   Procedure Laterality Date  . AMPUTATION Left 04/28/2020   Procedure: LEFT LITTLE FINGER AMPUTATION;  Surgeon: Nadara Mustard, MD;  Location: [facility] OR;  Service: Orthopedics;  Laterality: Left;  . CARDIAC CATHETERIZATION  12-27-2001  DR Jacinto Halim  &  05-26-2009  DR Eldridge Dace   RESULTS FOR BOTH ARE NORMAL CORONARIES AND PERSERVED LVF/ EF 60%  . CARPAL TUNNEL RELEASE Bilateral right 09-16-2003;  left ?  . CARPAL TUNNEL RELEASE Left 02/25/2015   Procedure: LEFT CARPAL TUNNEL RELEASE;  Surgeon: Betha Loa, MD;  Location: [facility];  Service: Orthopedics;  Laterality: Left;  . CYSTOSCOPY N/A 10/10/2012   Procedure: CYSTOSCOPY CLOT EVACUATION FULGERATION OF BLEEDERS ;  Surgeon: Garnett Farm, MD;  Location: [facility];  Service: Urology;  Laterality: N/A;  . CYSTOSCOPY WITH BIOPSY N/A 11/26/2015   Procedure: CYSTOSCOPY WITH BIOPSY AND FULGURATION;  Surgeon: Ihor Gully, MD;  Location: [facility];  Service: Urology;  Laterality: N/A;  . ESOPHAGOGASTRODUODENOSCOPY  2014  . LAPAROSCOPIC CHOLECYSTECTOMY  11-17-2010  . ORCHIECTOMY Right 02/21/2016   Procedure: SCROTAL ORCHIECTOMY with TESTICULAR PROSTHESIS IMPLANT;  Surgeon: Ihor Gully, MD;  Location: [facility];  Service: Urology;  Laterality: Right;  . ORCHIECTOMY Left 09/02/2018   Procedure: ORCHIECTOMY;  Surgeon: Ihor Gully, MD;  Location[facility]  Service: Urology;  Laterality: Left;  . ROTATOR CUFF REPAIR Right 12/2004  . TRANSURETHRAL RESECTION OF BLADDER TUMOR N/A 08/09/2012   Procedure: TRANSURETHRAL RESECTION OF BLADDER TUMOR (TURBT) WITH GYRUS WITH MITOMYCIN C;  Surgeon: Garnett Farm, MD;  Location: [facility];  Service: Urology;  Laterality: N/A;  . TRANSURETHRAL RESECTION OF BLADDER TUMOR N/A 03/29/2020   Procedure: TRANSURETHRAL RESECTION OF BLADDER TUMOR (TURBT) and post-op instillation of gemcitabine;  Surgeon: [name], MD;  Location:  [facility];  Service: Urology;  Laterality: N/A;  . TRANSURETHRAL RESECTION OF BLADDER TUMOR WITH GYRUS (TURBT-GYRUS) N/A 02/27/2014   Procedure: TRANSURETHRAL RESECTION OF BLADDER TUMOR WITH GYRUS (TURBT-GYRUS);  Surgeon: Garnett Farm, MD;  Location: [facility];  Service: Urology;  Laterality: N/A;  Family History  Problem Relation Age of Onset  . Diabetes Mother   . Diabetes Father   . Hypertension Father   . Heart attack Father 62       died age 86  . Alcohol abuse Father   . Colon cancer Neg Hx   . Esophageal cancer Neg Hx   . Stomach cancer Neg Hx   . Rectal cancer Neg Hx              Social History:  reports that he has been smoking cigarettes. He has a 57 pack-year smoking history. He has never used smokeless tobacco. He reports that he does not drink alcohol and does not use drugs.  Allergies  Allergen Reactions  . Celecoxib Anaphylaxis, Swelling and Rash    Tongue swells  . Hydrocodone Rash and Other (See Comments)    "blisters developed on arms"   . Sulfa Antibiotics Rash  . Sulfacetamide Sodium Rash    HOME MEDICATIONS:                                                                                                                     No current facility-administered medications on file prior to encounter.   Current Outpatient Medications on File Prior to Encounter  Medication Sig Dispense Refill  . acetaminophen (TYLENOL) 325 MG tablet Take 2 tablets (650 mg total) by mouth every 6 (six) hours as needed for mild pain (or Fever >/= 101). 20 tablet 0  . baclofen (LIORESAL) 10 MG tablet Take 10 mg by mouth 3 (three) times daily. Sister only gives it to Patient at Bedtime.    . clopidogrel (PLAVIX) 75 MG tablet Take 75 mg by mouth daily. Wife called and spoke with nurse.  This is a medication the patient takes at home that was not entered at admission. Dr. Sibyl Parr informed.    . DULoxetine (CYMBALTA) 60 MG capsule Take 60 mg by mouth  daily. Wife called and spoke with nurse.  This is a medication the patient takes at home that was not entered at admission. Dr. Sibyl Parr informed.    Marland Kitchen HUMALOG KWIKPEN 200 UNIT/ML KwikPen Inject 20 Units into the skin in the morning, at noon, and at bedtime. (Patient taking differently: Inject 35 Units into the skin in the morning, at noon, and at bedtime. Sister stated Dr. Catalina Pizza her 35 units on both Insulin Pens)    . hydroxychloroquine (PLAQUENIL) 200 MG tablet Take 200 mg by mouth 2 (two) times daily. Wife called and spoke with nurse.  This is a medication the patient takes at home that was not entered at admission. Dr. Sibyl Parr informed.    . hydrOXYzine (ATARAX) 25 MG tablet Take 25 mg by mouth at bedtime as needed for anxiety or itching.    . insulin glargine, 2 Unit Dial, (TOUJEO MAX SOLOSTAR) 300 UNIT/ML Solostar Pen Inject 40 Units into the skin 2 (two) times daily at 8 am and 10 pm. (Patient taking differently: Inject 35 Units into the  skin 2 (two) times daily at 8 am and 10 pm. Sister stated Dr. Has Patient using 35 units on both insulin pens.)    . lamoTRIgine (LAMICTAL) 150 MG tablet Take 150 mg by mouth 2 (two) times daily. Wife called and spoke with nurse.  This is a medication the patient takes at home that was not entered at admission. Dr. Sibyl Parr informed.    . lurasidone (LATUDA) 20 MG TABS tablet Take 40 mg by mouth at bedtime.    Marland Kitchen omeprazole (PRILOSEC) 40 MG capsule Take 40 mg by mouth daily.    . pregabalin (LYRICA) 200 MG capsule Take 200 mg by mouth 3 (three) times daily.    Marland Kitchen rOPINIRole (REQUIP) 0.5 MG tablet Take 0.5 mg by mouth at bedtime.    . rosuvastatin (CRESTOR) 10 MG tablet Take 1 tablet (10 mg total) by mouth daily. (Patient taking differently: Take 10 mg by mouth at bedtime.) 30 tablet 0  . tamsulosin (FLOMAX) 0.4 MG CAPS capsule Take 0.4 mg by mouth at bedtime.    Marland Kitchen venlafaxine XR (EFFEXOR-XR) 150 MG 24 hr capsule Take 150 mg by mouth daily with breakfast. Wife called and  spoke with nurse.  This is a medication the patient takes at home that was not entered at admission. Dr. Sibyl Parr informed.    . nicotine (NICODERM CQ - DOSED IN MG/24 HOURS) 21 mg/24hr patch Place 1 patch (21 mg total) onto the skin daily. 28 patch 0     ROS:                                                                                                                                       Unable to obtain due to mutism versus poor cooperation.   BP 94/60, HR 79, RR 18, O2 Sat 98% RA, Temp 96.6 . Height 6' (1.829 m), weight 89.4 kg.   General Examination:                                                                                                       Physical Exam  HEENT:  Gray/AT. Skin to face and scalp is pale, cool; and clammy.  Lungs: Respirations unlabored but states he has difficulty breathing Extremities: Pale, cool and clammy upper extremities and lower extremities to lower thighs. Below knees skin is dry, cool and pale.    Neurological Examination Mental Status: Eyes open and nonverbal with no spontaneous movement other than slight mouth opening and closing as though attempting  to speak. Initially not following commands, in CT he began responding to commands and started to speak in short, fluent, hypophonic phrases in answer to questions regarding his symptoms.  Cranial Nerves: II: Pupils 3 mm and sluggishly reactive. Inconsistent blink to threat. He stated "both" when asked if he could see both hands moving simultaneously in bilateral visual fields after CT was completed, otherwise not cooperative with multiple initial attempts to assess visual fields.  III,IV, VI: No ptosis. Eyes conjugate at the midline. No nystagmus. Did briefly gaze to left and right after initially remaining motionless.   V: States initially that he has no feeling to fine and coarse touch stimuli bilaterally. At end of exam he then indicated bilateral diminished sensation when retested.  VII: Does not smile  to command. Face flaccidly symmetric with mouth held partially open.  VIII: Hearing intact to voice.  IX,X: Hypophonic speech. Gag reflex deferred due to concern for possible aspiration.  XI: Head is midline. XII: Weakly extends tongue midline  Motor: Decreased tone x 4 symmetrically.. No posturing, tremor or jerking movements. Does not elevate any of his extremities antigravity to command. Weakly wiggles fingers and toes to command.  Sensory: States he is insensate to all pinch stimuli to left lower leg, foot and thigh. Subjectively diminished sensation to RLE to pinch throughout, except no sensation to right foot.  Diminished but present sensation to pinch in BUE less on the left.  Deep Tendon Reflexes: Hypoactive throughout Cerebellar: Unable to assess Gait: Unable to assess   Lab Results: Basic Metabolic Panel: Recent Labs  Lab 09/24/22 1604  NA 139  K 4.0  CL 108  GLUCOSE 126*  BUN 24*  CREATININE 1.50*    CBC: Recent Labs  Lab 09/24/22 1555 09/24/22 1604  WBC 7.6  --   NEUTROABS 3.6  --   HGB 12.6* 11.6*  HCT 37.4* 34.0*  MCV 79.7*  --   PLT 328  --     Cardiac Enzymes: No results for input(s): "CKTOTAL", "CKMB", "CKMBINDEX", "TROPONINI" in the last 168 hours.  Lipid Panel: No results for input(s): "CHOL", "TRIG", "HDL", "CHOLHDL", "VLDL", "LDLCALC" in the last 168 hours.  Imaging: CT HEAD CODE STROKE WO CONTRAST  Result Date: 09/24/2022 CLINICAL DATA:  Code stroke. Neuro deficit, acute, stroke suspected. EXAM: CT HEAD WITHOUT CONTRAST TECHNIQUE: Contiguous axial images were obtained from the base of the skull through the vertex without intravenous contrast. RADIATION DOSE REDUCTION: This exam was performed according to the departmental dose-optimization program which includes automated exposure control, adjustment of the mA and/or kV according to patient size and/or use of iterative reconstruction technique. COMPARISON:  CT head without contrast 07/18/2022. MR  head without contrast 08/27/2022 FINDINGS: Brain: No acute infarct, hemorrhage, or mass lesion is present. No significant white matter lesions are present. Deep brain nuclei are within normal limits. Insular ribbon is normal bilaterally. The ventricles are of normal size. No significant extraaxial fluid collection is present. The brainstem and cerebellum are within normal limits. Midline structures are within normal limits. Vascular: Atherosclerotic calcifications are present within the cavernous internal carotid arteries bilaterally. No hyperdense vessel is present. Skull: Calvarium is intact. No focal lytic or blastic lesions are present. No significant extracranial soft tissue lesion is present. Sinuses/Orbits: The paranasal sinuses and mastoid air cells are clear. The globes and orbits are within normal limits. ASPECTS [facility] Stroke Program Early CT Score) - Ganglionic level infarction (caudate, lentiform nuclei, internal capsule, insula, M1-M3 cortex): 7/7 - Supraganglionic infarction (M4-M6 cortex):  3/3 Total score (0-10 with 10 being normal): 10/10 IMPRESSION: 1. Negative CT of the head. 2. Aspects is 10/10. The above was relayed via text pager to Dr. Otelia Limes on 09/24/2022 at 16:15 . Electronically Signed   By: Marin Roberts M.D.   On: 09/24/2022 16:17     Assessment: 57 year old male presenting with acute onset of diffuse limb weakness and confusion. Code Stroke was called in the field per protocol for the slurred speech and left sided weakness perceived by family, which caused them to call EMS. However, overall appearance to EMS was felt to be more consistent with an acute cardiac event.  - Exam reveals diffuse severe weakness of BUE and BLE as well as of trunk/abdominal muscles and neck. He can wiggle toes and fingers weakly but symmetrically. He can respond to simple questions and commands. There is no facial droop, forced gaze deviation or vision loss.  - CT head. Normal. ASPECTS 10.  -  DDx: Uncertain if this is due to hypotension and fatigue in conjunction with patients' chest pressure. His skin is pale, clammy, cool and diaphoretic. He also complains of SOB. Overall suspect a cardiac etiology or aortic dissection versus other acute systemic illness as the etiology over stroke.  - CTA of head and neck is being obtained STAT along with CTA of chest.  - EKG per EDP did not show any ST elevation. NSTEMI is on the DDx. ED is ordering Troponins and other cardiac work up.  - Overlapping sepsis has been diagnosed by EDP based on hypotension, AMS and known buttock abscess.  - Of note, he was admitted last month with the same presentation which was found to be due to polypharmacy  Recommendations: - CTA of head and neck in conjunction with CTA of chest.  - May need MRI brain after CTA if no dissection is seen.  - UDS - EtOH level  Addendum: - UDS negative. - EtOH < 10 - CTA of head and neck: ***   Electronically signed: Dr. Caryl Pina 09/24/2022, 4:20 PM

## 2022-09-24 NOTE — ED Notes (Signed)
Patient transported to MRI 

## 2022-09-24 NOTE — ED Notes (Signed)
Patient transported to X-ray 

## 2022-09-24 NOTE — H&P (Addendum)
History and Physical    Patient: MONG NEAL JYN:[id number] DOB: 1966/01/18 DOA: 09/24/2022 DOS: the patient was seen and examined on 09/24/2022 PCP: Dois Davenport, MD  Patient coming from: Home  Chief Complaint:  Chief Complaint  Patient presents with   Code Stroke   HPI: CALEY VOLKERT is a 57 y.o. male with medical history significant of anxiety, benign essential tremor, bipolar disorder type II, bladder cancer, CAD, chronic pain, COPD, uncontrolled diabetes, diabetic peripheral neuropathy, GERD and gastroparesis presents to the ED with altered mental status.  Patient was last seen normal at 11 AM.  He was visiting his ex-spouse left house 11 AM.  He was then found down with altered mental status at 2.30 PM by his sister.  In the ED he was found to have reduced movement on the left side, left-sided facial droop and aphasia.  Of note patient recently had a pilonidal abscess drained with a course of p.o. antibiotics. Patient was recently admitted to the hospital with AKI and acute encephalopathy from 08/26/2022 to 08/29/2022. Denies chest pain, dyspnea, palpitations, dizziness, cough etc.   Imaging  CT head negative for stroke.  CT angio: No significant proximal stenosis, aneurysm, large vessel occlusion within the circle of Willis, stable atherosclerotic calcifications within the cavernous internal carotid arteries without significant stenosis MRI brain: neg for acute stroke   ED course Vital signs on arrival: BP 75/45, sats 100% on room air, respiratory rate 18, heart rate 84, temp 96.6.  Code stroke and code sepsis was called.  Labs: Creatinine 1.5, glucose 126, albumin 3.2, hemoglobin 12.6, MCV 79, lactic acid 1.6, INR 0.9, UDS negative, ethanol negative.  Blood cultures were also drawn.  Patient received IV cefepime and vancomycin and 2 L bolus LR in the ED.  Review of Systems: As mentioned in the history of present illness. All other systems reviewed and are negative. Past Medical  History:  Diagnosis Date   Anxiety    Benign essential tremor    Bipolar 2 disorder [facility])    followed by [facility]--- dr s. Lolly Mustache   Bladder cancer [facility])    recurrent   CAD (coronary artery disease)    cardiac cath 2003  and 2011 both showed normal coronary arteries w/ preserved lvf;  Non obstructive on CTA Oct 2019.    Chronic pain syndrome    back---- [facility] [place]   Cold extremities    BLE   COPD (chronic obstructive pulmonary disease) (HCC)    DDD (degenerative disc disease), lumbar    Diabetic peripheral neuropathy (HCC)    Gastroparesis    followed by dr Marina Goodell   GERD (gastroesophageal reflux disease)    Hiatal hernia    History of bladder cancer urologist-  previously dr Ronal Fear;  now dr gay   papillay TCC (Ta G1)  s/p TURBT and chemo instillation 2014   History of chest pain 12/2017   heart cath normal   History of encephalopathy 05/27/2015   admission w/ acute encephalopathy thought to be secondary to pain meds and COPD   History of gastric ulcer    History of Helicobacter pylori infection    History of kidney stones    History of TIA (transient ischemic attack) 2008  and 10-19-2018    no residual's   History of traumatic head injury 2010   w/ LOC  per pt needed stitches, hit in head with a mower blade   Hyperlipidemia    Hypertension  Hypogonadism male    s/p  bilateral orchiectomy   Hypothyroidism    Insomnia    Mild obstructive sleep apnea    study in epic 12-04-2016, no cpap   PTSD (post-traumatic stress disorder)    chronic   PTSD (post-traumatic stress disorder)    RA (rheumatoid arthritis) (HCC)    followed by [facility]   Seizures, transient [facility]) neurologist-  dr Terrace Arabia--  differential dx complex partial seizure .vs.  mood disorder .vs.  pseudoseizure--  negative EEG's   confusion episodes and staring spells since 11/ 2015   (03-26-2020 per pt wife last seizure 10 /2021)   Transient confusion NEUOROLOGIST-  DR Terrace Arabia    Episodes since 11/ 2015--  neurologist dx  differential complex partial seizure  .vs. mood disorder . vs. pseudoseizure   Type 2 diabetes mellitus treated with insulin [facility])    endocrinologist--- dr Everardo All---  (03-26-2020 pt does not check blood sugar at home)   Past Surgical History:  Procedure Laterality Date   AMPUTATION Left 04/28/2020   Procedure: LEFT LITTLE FINGER AMPUTATION;  Surgeon: Nadara Mustard, MD;  Location: [facility] OR;  Service: Orthopedics;  Laterality: Left;   CARDIAC CATHETERIZATION  12-27-2001  DR Jacinto Halim  &  05-26-2009  DR Eldridge Dace   RESULTS FOR BOTH ARE NORMAL CORONARIES AND PERSERVED LVF/ EF 60%   CARPAL TUNNEL RELEASE Bilateral right 09-16-2003;  left ?   CARPAL TUNNEL RELEASE Left 02/25/2015   Procedure: LEFT CARPAL TUNNEL RELEASE;  Surgeon: Betha Loa, MD;  Location: [facility];  Service: Orthopedics;  Laterality: Left;   CYSTOSCOPY N/A 10/10/2012   Procedure: CYSTOSCOPY CLOT EVACUATION FULGERATION OF BLEEDERS ;  Surgeon: Garnett Farm, MD;  Location: [facility];  Service: Urology;  Laterality: N/A;   CYSTOSCOPY WITH BIOPSY N/A 11/26/2015   Procedure: CYSTOSCOPY WITH BIOPSY AND FULGURATION;  Surgeon: Ihor Gully, MD;  Location: [facility];  Service: Urology;  Laterality: N/A;   ESOPHAGOGASTRODUODENOSCOPY  2014   LAPAROSCOPIC CHOLECYSTECTOMY  11-17-2010   ORCHIECTOMY Right 02/21/2016   Procedure: SCROTAL ORCHIECTOMY with TESTICULAR PROSTHESIS IMPLANT;  Surgeon: Ihor Gully, MD;  Location[facility];  Service: Urology;  Laterality: Right;   ORCHIECTOMY Left 09/02/2018   Procedure: ORCHIECTOMY;  Surgeon: Ihor Gully, MD;  Location: [facility];  Service: Urology;  Laterality: Left;   ROTATOR CUFF REPAIR Right 12/2004   TRANSURETHRAL RESECTION OF BLADDER TUMOR N/A 08/09/2012   Procedure: TRANSURETHRAL RESECTION OF BLADDER TUMOR (TURBT) WITH GYRUS WITH MITOMYCIN C;  Surgeon: Garnett Farm, MD;   Location: [facility];  Service: Urology;  Laterality: N/A;   TRANSURETHRAL RESECTION OF BLADDER TUMOR N/A 03/29/2020   Procedure: TRANSURETHRAL RESECTION OF BLADDER TUMOR (TURBT) and post-op instillation of gemcitabine;  Surgeon: [name], MD;  Location: [facility];  Service: Urology;  Laterality: N/A;   TRANSURETHRAL RESECTION OF BLADDER TUMOR WITH GYRUS (TURBT-GYRUS) N/A 02/27/2014   Procedure: TRANSURETHRAL RESECTION OF BLADDER TUMOR WITH GYRUS (TURBT-GYRUS);  Surgeon: Garnett Farm, MD;  Location: [facility];  Service: Urology;  Laterality: N/A;   Social History:  reports that he has been smoking cigarettes. He has a 57 pack-year smoking history. He has never used smokeless tobacco. He reports that he does not drink alcohol and does not use drugs.  Allergies  Allergen Reactions   Celecoxib Anaphylaxis, Swelling and Rash    Tongue swells   Hydrocodone Rash and Other (See Comments)    "  blisters developed on arms"    Sulfa Antibiotics Rash   Sulfacetamide Sodium Rash    Family History  Problem Relation Age of Onset   Diabetes Mother    Diabetes Father    Hypertension Father    Heart attack Father 96       died age 48   Alcohol abuse Father    Colon cancer Neg Hx    Esophageal cancer Neg Hx    Stomach cancer Neg Hx    Rectal cancer Neg Hx     Prior to Admission medications   Medication Sig Start Date End Date Taking? Authorizing Provider  acetaminophen (TYLENOL) 325 MG tablet Take 2 tablets (650 mg total) by mouth every 6 (six) hours as needed for mild pain (or Fever >/= 101). 08/29/22  Yes Sheikh, Omair Latif, DO  baclofen (LIORESAL) 10 MG tablet Take 10 mg by mouth 3 (three) times daily. Sister only gives it to Patient at Bedtime.   Yes [provider]  clopidogrel (PLAVIX) 75 MG tablet Take 75 mg by mouth daily. Wife called and spoke with nurse.  This is a medication the patient takes at home that was not entered at  admission. Dr. Sibyl Parr informed.   Yes [provider]  DULoxetine (CYMBALTA) 60 MG capsule Take 60 mg by mouth daily. Wife called and spoke with nurse.  This is a medication the patient takes at home that was not entered at admission. Dr. Sibyl Parr informed.   Yes [provider]  HUMALOG KWIKPEN 200 UNIT/ML KwikPen Inject 20 Units into the skin in the morning, at noon, and at bedtime. Patient taking differently: Inject 35 Units into the skin in the morning, at noon, and at bedtime. Sister stated Dr. Catalina Pizza her 35 units on both Insulin Pens 08/29/22  Yes Sheikh, Omair Latif, DO  hydroxychloroquine (PLAQUENIL) 200 MG tablet Take 200 mg by mouth 2 (two) times daily. Wife called and spoke with nurse.  This is a medication the patient takes at home that was not entered at admission. Dr. Sibyl Parr informed.   Yes [provider]  hydrOXYzine (ATARAX) 25 MG tablet Take 25 mg by mouth at bedtime as needed for anxiety or itching. 08/01/22  Yes [provider]  insulin glargine, 2 Unit Dial, (TOUJEO MAX SOLOSTAR) 300 UNIT/ML Solostar Pen Inject 40 Units into the skin 2 (two) times daily at 8 am and 10 pm. Patient taking differently: Inject 35 Units into the skin 2 (two) times daily at 8 am and 10 pm. Sister stated Dr. Has Patient using 35 units on both insulin pens. 08/29/22  Yes Sheikh, Omair Latif, DO  lamoTRIgine (LAMICTAL) 150 MG tablet Take 150 mg by mouth 2 (two) times daily. Wife called and spoke with nurse.  This is a medication the patient takes at home that was not entered at admission. Dr. Sibyl Parr informed.   Yes [provider]  lurasidone (LATUDA) 20 MG TABS tablet Take 40 mg by mouth at bedtime.   Yes [provider]  omeprazole (PRILOSEC) 40 MG capsule Take 40 mg by mouth daily. 08/23/21  Yes [provider]  pregabalin (LYRICA) 200 MG capsule Take 200 mg by mouth 3 (three) times daily. 12/25/21  Yes Dois Davenport, MD  rOPINIRole (REQUIP) 0.5 MG  tablet Take 0.5 mg by mouth at bedtime. 07/25/22  Yes [provider]  rosuvastatin (CRESTOR) 10 MG tablet Take 1 tablet (10 mg total) by mouth daily. Patient taking differently: Take 10 mg by mouth  at bedtime. 08/04/21 09/24/22 Yes Pashayan, Mardelle Matte, MD  tamsulosin (FLOMAX) 0.4 MG CAPS capsule Take 0.4 mg by mouth at bedtime.   Yes [provider]  venlafaxine XR (EFFEXOR-XR) 150 MG 24 hr capsule Take 150 mg by mouth daily with breakfast. Wife called and spoke with nurse.  This is a medication the patient takes at home that was not entered at admission. Dr. Sibyl Parr informed.   Yes [provider]  nicotine (NICODERM CQ - DOSED IN MG/24 HOURS) 21 mg/24hr patch Place 1 patch (21 mg total) onto the skin daily. 08/30/22   Merlene Laughter, DO    Physical Exam: Vitals:   09/24/22 1815 09/24/22 1845 09/24/22 1900 09/24/22 1915  BP: (!) 168/91 (!) 168/94 (!) 149/83 (!) 159/88  Pulse: 88 86 84 84  Resp: 16 19 17 17   Temp:      TempSrc:      SpO2: 99% 99% 98% 99%  Weight:      Height:       General: Alert, no acute distress, appears older than stated age, able to follow commands, Cardio: Normal S1 and S2, RRR, no r/m/g Pulm: CTAB, normal work of breathing Abdomen: Bowel sounds normal. Abdomen soft and non-tender.  Extremities: No peripheral edema.  Neuro: LUE and LLE 3/5, RUE and RLE 4/5 strength, reduced sensation LUE, LLE PERRLA 3mm bilaterally   Data Reviewed:  There are no new results to review at this time.  Assessment and Plan:   ?Spinal cord lesion  Left sided weakness  Pt presented with AMS, hypotension, limb weakness and slurring of speech. CT head neg, MRI brain neg for acute or subacute stroke. UDS neg, ethanol neg. CT chest: neg for dissection. Etiology of weakness remains unclear as stroke ruled out with imaging. D/w Dr Alice Rieger who feels a spinal cord lesion is higher on differential now and pt needs stat MRI c-spine. -Admit to  progressive -Vitals per floor routine -Up with assistance  -Neuro following; appreciate recs -Monitor on telemetry -Frequent neuro checks -Risk strat labs: hgb A1C, lipid, TSH -NPO pending swallow eval -PT/OT/SLP -Continue statin - Ppx SCDs -CBC CMP a.m.  Chest pressure Per notes pt reported chest pressure and was diaphoretic on admission however no longer reporting this. EKG: SR, otherwise non specific ST and T waves findings. -Trend troponins  -Repeat EKG am  Sepsis 2/2 pilonidal abscess S/p drainage in the ED today -Continue vancomycin and cefepime -Consider general surgery consult if no improvement in abscess  Hypotension BP initially 75/45, now 159/88. S/p 2L LR bolus. Likely secondary to autonomic dysfunction and infection -Allow permissive HTN in setting of possible stroke   Type 2 diabetes  Glucose 126 on admission. Last A1c 14.1 on 08/26/22 -Hold home diabetic meds -CBGs 4 times daily with meals and at bedtime -Sliding scale insulin -Can start long acting insulin if CBGs persistently> 200  Chronic pain  muscle spasms  -Continue pregabalin 200 mg 3 times daily -Hold Baclofen 10mg  at bedtime -Hold Ropinirole 0.5mg  at bedtime   Bipolar disorder -Continue Latuda, Lamotrigine, Duloxetine,    BPH -Hold Flomax in the setting of labile BPs  Hyperlipidemia -Continue Crestor 20mg    Rheumatoid arthritis -Continue hydroxychloroquine 200 mg twice daily  GERD -Continue protonix   Hx of bladder cancer, CVA     Advance Care Planning:   Code Status: Prior FUL   Consults: Neurology  Family Communication:   Severity of Illness: The appropriate patient status for this patient is INPATIENT. Inpatient status is  judged to be reasonable and necessary in order to provide the required intensity of service to ensure the patient's safety. The patient's presenting symptoms, physical exam findings, and initial radiographic and laboratory data in the context of their chronic  comorbidities is felt to place them at high risk for further clinical deterioration. Furthermore, it is not anticipated that the patient will be medically stable for discharge from the hospital within 2 midnights of admission.   * I certify that at the point of admission it is my clinical judgment that the patient will require inpatient hospital care spanning beyond 2 midnights from the point of admission due to high intensity of service, high risk for further deterioration and high frequency of surveillance required.*  Author: Rolm Gala, MD 09/24/2022 8:07 PM  For on call review [email or web address].

## 2022-09-24 NOTE — Progress Notes (Signed)
ED Pharmacy Antibiotic Sign Off An antibiotic consult was received from an ED provider for Vancomycin per pharmacy dosing for sepsis. A chart review was completed to assess appropriateness.   The following one time order(s) were placed:  Vancomycin 2g IV x1  Further antibiotic and/or antibiotic pharmacy consults should be ordered by the admitting provider if indicated.   Thank you for allowing pharmacy to be a part of this patient's care.   Wilburn Cornelia, PharmD, BCPS Clinical Pharmacist 09/24/2022 5:56 PM   Please refer to [facility] for pharmacy phone number

## 2022-09-24 NOTE — ED Provider Notes (Signed)
[place] EMERGENCY DEPARTMENT AT [facility] Provider Note   CSN: [id number] Arrival date & time: 09/24/22  1555  An emergency department physician performed an initial assessment on this suspected stroke patient at 1559.  History Chief Complaint  Patient presents with   Code Stroke    HPI Vincent [name] is a 57 y.o. male presenting for chief complaint of altered mental status  Called to bedside emergently as patient is hypotensive confused. Activated as a code stroke by EMS with neurologist at bedside on my arrival.  Patient cannot provide much collateral history due to altered mental status.  Chart review reveals a significant history of diabetes, chronic pain, bipolar, PTSD, CAD, COPD, GERD, history of bladder cancer/gastroparesis presenting very similarly 1 month ago with altered mental status and hypotension.  Historically, this was attributed to medication encephalopathy from polypharmacy. Patient's recorded medical, surgical, social, medication list and allergies were reviewed in the Snapshot window as part of the initial history.   Review of Systems   Review of Systems  Unable to perform ROS: Acuity of condition    Physical Exam Updated Vital Signs BP 137/78   Pulse 80   Temp (!) 96.8 F (36 C) (Temporal)   Resp 15   Ht 6' (1.829 m)   Wt 89.4 kg   SpO2 97%   BMI 26.73 kg/m  Physical Exam Physical Exam  Neurologic: GCS 10, motor and sensory diminished systemically at first.  On serial exam seems more diminished on left side Head: Pupils are 3 mm mm, equally round and reactive to light, patient has no obvious facial trauma, no hemotympanum  Neck: patient has no midline neck tenderness, no obvious injuries.  Thorax: Patient has stable clavicles, stable thorax with bilateral chest rise and breath sounds heard.  No penetrating thoracic injury.  CV/Pulm: RRR, no audible murmer/rubs/gallops, CTAB  Abdomen: Patient has no abdominal distention, no  penetrating abdominal injury.  Back: Patient has no midline spinal tenderness in the thoracic and lumbar spine, patient has no paraspinal tenderness bilaterally.  Pelvis: Patient has a stable pelvis to compression with palpable femoral pulses.  Extremities:Patient's upper extremities with no obvious injury or abnormality, radial pulses present. Patient's lower extremities with no obvious injury or abnormality, tibial pulses present.    ED Course/ Medical Decision Making/ A&P    Procedures .Critical Care  Performed by: Glyn Ade, MD Authorized by: Glyn Ade, MD   Critical care provider statement:    Critical care time (minutes):  90   Critical care was necessary to treat or prevent imminent or life-threatening deterioration of the following conditions:  Sepsis, CNS failure or compromise and shock   Critical care was time spent personally by me on the following activities:  Development of treatment plan with patient or surrogate, discussions with consultants, evaluation of patient's response to treatment, examination of patient, ordering and review of laboratory studies, ordering and review of radiographic studies, ordering and performing treatments and interventions, pulse oximetry, re-evaluation of patient's condition and review of old charts   Care discussed with: admitting provider   .Marland KitchenIncision and Drainage  Date/Time: 09/24/2022 11:05 PM  Performed by: Glyn Ade, MD Authorized by: Glyn Ade, MD   Consent:    Consent obtained:  Emergent situation   Consent given by:  Patient   Risks, benefits, and alternatives were discussed: yes   Procedure details:    Incision types:  Single with marsupialization   Wound management:  Probed and deloculated, irrigated with saline and extensive  cleaning   Drainage:  Purulent   Drainage amount:  Copious   Wound treatment:  Wound left open Post-procedure details:    Procedure completion:  Tolerated Ultrasound ED  Peripheral IV (Provider)  Date/Time: 09/24/2022 11:06 PM  Performed by: Glyn Ade, MD Authorized by: Glyn Ade, MD   Procedure details:    Indications: hypotension and multiple failed IV attempts     Skin Prep: chlorhexidine gluconate     Location:  Right AC   Angiocath:  18 G   Bedside Ultrasound Guided: Yes     Images: archived     Patient tolerated procedure without complications: Yes      Medications Ordered in ED Medications  sodium chloride flush (NS) 0.9 % injection 3 mL (3 mLs Intravenous Not Given 09/24/22 1648)  acetaminophen (TYLENOL) tablet 650 mg (has no administration in time range)  hydroxychloroquine (PLAQUENIL) tablet 200 mg (200 mg Oral Given 09/24/22 2140)  rosuvastatin (CRESTOR) tablet 10 mg (10 mg Oral Given 09/24/22 2138)  DULoxetine (CYMBALTA) DR capsule 60 mg (has no administration in time range)  lurasidone (LATUDA) tablet 40 mg (40 mg Oral Given 09/24/22 2140)  venlafaxine XR (EFFEXOR-XR) 24 hr capsule 150 mg (has no administration in time range)  pantoprazole (PROTONIX) EC tablet 40 mg (has no administration in time range)  lamoTRIgine (LAMICTAL) tablet 150 mg (150 mg Oral Given 09/24/22 2139)  pregabalin (LYRICA) capsule 200 mg (200 mg Oral Given 09/24/22 2138)   stroke: early stages of recovery book (has no administration in time range)  insulin aspart (novoLOG) injection 0-15 Units (has no administration in time range)  lactated ringers bolus 2,000 mL (0 mLs Intravenous Stopped 09/24/22 1836)  iohexol (OMNIPAQUE) 350 MG/ML injection 100 mL (100 mLs Intravenous Contrast Given 09/24/22 1635)  lidocaine-EPINEPHrine (XYLOCAINE W/EPI) 2 %-1:200000 (PF) injection 20 mL (20 mLs Intradermal Given 09/24/22 1836)  ceFEPIme (MAXIPIME) 2 g in sodium chloride 0.9 % 100 mL IVPB (0 g Intravenous Stopped 09/24/22 1906)  vancomycin (VANCOREADY) IVPB 2000 mg/400 mL (0 mg Intravenous Stopped 09/24/22 2045)    Medical Decision Making:    GUILLAUME BRUMMER is a 57  y.o. male who presented to the ED today with chief complaint of altered mental status detailed above activated as a code stroke. Patient found to be dramatically hypotensive 60s over 40s on arrival leading to a complicated resuscitation. Difficult IV access (line from EMS blue on their arrival).  I personally performed IV access and hung a liter of IV fluids for resuscitation for his hypotension. Synchronously, and neurology was attempting to do an evaluation for CVA given his reported left-sided weakness and confusion.  He was uncertain if his symptoms were metabolic or neurologic given the diffuse nature and intermittent altered mental status. Halfway through the resuscitation, the patient became completely obtunded and unresponsive.  Required sternal rub for restoration of his GCS. I-STAT creatinine within normal limits so angiography was performed head neck chest abdomen pelvis to evaluate for aortic/dissection pathology. Patient was taken back to resuscitation bay for stabilization. Further IV access was obtained IV fluids administered. Reassessment: On reassessment, his CT angiography demonstrated a recurrence of his pilonidal abscess with gas formation.  Given his clinical instability, developing of shock, performed bedside incision and drainage with substantial purulent extraction approximately 10 to 15 cc. His mental status gradually improved.  He was being treated for potential sepsis thought to be from his cellulitis/abscess.  Blood pressures return to his normal however his left-sided deficits were remained persistent.  Pharmacy  reached out to patient's family synchronously, they reported his last known well was as early as 10 AM.  Neurology was informed of his persistent deficits despite resuscitation and MRI was added on for further diagnostic care and management. Reassessment: On reassessment, he has had continued clinical improvement though he has left-sided weakness that is still  persistent. Consulted hospitalist for further diagnostic care management and admission.  They agreed with need for admission and patient admitted with no further acute events.  Clinical Impression:  1. Shock [facility])      Admit   Final Clinical Impression(s) / ED Diagnoses Final diagnoses:  Shock [facility])    Rx / DC Orders ED Discharge Orders     None         Glyn Ade, MD 09/24/22 2306

## 2022-09-25 ENCOUNTER — Inpatient Hospital Stay (HOSPITAL_COMMUNITY): Payer: Medicare HMO

## 2022-09-25 ENCOUNTER — Encounter (HOSPITAL_COMMUNITY): Payer: Self-pay | Admitting: Family Medicine

## 2022-09-25 ENCOUNTER — Other Ambulatory Visit (HOSPITAL_COMMUNITY): Payer: Medicare HMO

## 2022-09-25 DIAGNOSIS — L0231 Cutaneous abscess of buttock: Secondary | ICD-10-CM

## 2022-09-25 DIAGNOSIS — G9340 Encephalopathy, unspecified: Secondary | ICD-10-CM | POA: Diagnosis not present

## 2022-09-25 DIAGNOSIS — I951 Orthostatic hypotension: Secondary | ICD-10-CM

## 2022-09-25 DIAGNOSIS — A419 Sepsis, unspecified organism: Secondary | ICD-10-CM

## 2022-09-25 DIAGNOSIS — I10 Essential (primary) hypertension: Secondary | ICD-10-CM

## 2022-09-25 DIAGNOSIS — E1142 Type 2 diabetes mellitus with diabetic polyneuropathy: Secondary | ICD-10-CM

## 2022-09-25 DIAGNOSIS — N179 Acute kidney failure, unspecified: Secondary | ICD-10-CM

## 2022-09-25 DIAGNOSIS — R6520 Severe sepsis without septic shock: Secondary | ICD-10-CM

## 2022-09-25 DIAGNOSIS — I959 Hypotension, unspecified: Secondary | ICD-10-CM | POA: Diagnosis not present

## 2022-09-25 LAB — HEMOGLOBIN A1C
Hgb A1c MFr Bld: 12.4 % — ABNORMAL HIGH (ref 4.8–5.6)
Mean Plasma Glucose: 309.18 mg/dL

## 2022-09-25 LAB — CBC
HCT: 33.8 % — ABNORMAL LOW (ref 39.0–52.0)
Hemoglobin: 11.2 g/dL — ABNORMAL LOW (ref 13.0–17.0)
MCH: 27.8 pg (ref 26.0–34.0)
MCHC: 33.1 g/dL (ref 30.0–36.0)
MCV: 83.9 fL (ref 80.0–100.0)
Platelets: 247 10*3/uL (ref 150–400)
RBC: 4.03 MIL/uL — ABNORMAL LOW (ref 4.22–5.81)
RDW: 14.1 % (ref 11.5–15.5)
WBC: 7 10*3/uL (ref 4.0–10.5)
nRBC: 0 % (ref 0.0–0.2)

## 2022-09-25 LAB — COMPREHENSIVE METABOLIC PANEL
ALT: 17 U/L (ref 0–44)
AST: 17 U/L (ref 15–41)
Albumin: 2.7 g/dL — ABNORMAL LOW (ref 3.5–5.0)
Alkaline Phosphatase: 65 U/L (ref 38–126)
Anion gap: 11 (ref 5–15)
BUN: 21 mg/dL — ABNORMAL HIGH (ref 6–20)
CO2: 20 mmol/L — ABNORMAL LOW (ref 22–32)
Calcium: 8.7 mg/dL — ABNORMAL LOW (ref 8.9–10.3)
Chloride: 105 mmol/L (ref 98–111)
Creatinine, Ser: 1.18 mg/dL (ref 0.61–1.24)
GFR, Estimated: >60 mL/min (ref >60)
Glucose, Bld: 152 mg/dL — ABNORMAL HIGH (ref 70–99)
Potassium: 4.1 mmol/L (ref 3.5–5.1)
Sodium: 136 mmol/L (ref 135–145)
Total Bilirubin: 0.7 mg/dL (ref 0.3–1.2)
Total Protein: 5 g/dL — ABNORMAL LOW (ref 6.5–8.1)

## 2022-09-25 LAB — ECHOCARDIOGRAM COMPLETE BUBBLE STUDY
AR max vel: 2.97 cm2
AV Area VTI: 2.98 cm2
AV Area mean vel: 3.02 cm2
AV Mean grad: 4 mmHg
AV Peak grad: 7.1 mmHg
Ao pk vel: 1.33 m/s
Area-P 1/2: 3.99 cm2
S' Lateral: 3.2 cm

## 2022-09-25 LAB — LIPID PANEL
Cholesterol: 275 mg/dL — ABNORMAL HIGH (ref 0–200)
HDL: 52 mg/dL (ref >40)
LDL Cholesterol: 164 mg/dL — ABNORMAL HIGH (ref 0–99)
Total CHOL/HDL Ratio: 5.3 RATIO
Triglycerides: 294 mg/dL — ABNORMAL HIGH (ref <150)
VLDL: 59 mg/dL — ABNORMAL HIGH (ref 0–40)

## 2022-09-25 LAB — TSH: TSH: 2.731 u[IU]/mL (ref 0.350–4.500)

## 2022-09-25 LAB — GLUCOSE, CAPILLARY
Glucose-Capillary: 227 mg/dL — ABNORMAL HIGH (ref 70–99)
Glucose-Capillary: 266 mg/dL — ABNORMAL HIGH (ref 70–99)

## 2022-09-25 LAB — CBG MONITORING, ED
Glucose-Capillary: 231 mg/dL — ABNORMAL HIGH (ref 70–99)
Glucose-Capillary: 235 mg/dL — ABNORMAL HIGH (ref 70–99)

## 2022-09-25 LAB — TROPONIN I (HIGH SENSITIVITY): Troponin I (High Sensitivity): 5 ng/L (ref <18)

## 2022-09-25 MED ORDER — HEPARIN SODIUM (PORCINE) 5000 UNIT/ML IJ SOLN
5000.0000 [IU] | Freq: Three times a day (TID) | INTRAMUSCULAR | Status: DC
  Administered 2022-09-25 – 2022-09-30 (×13): 5000 [IU] via SUBCUTANEOUS
  Filled 2022-09-25 (×14): qty 1

## 2022-09-25 MED ORDER — GADOBUTROL 1 MMOL/ML IV SOLN
9.0000 mL | Freq: Once | INTRAVENOUS | Status: AC | PRN
  Administered 2022-09-25: 9 mL via INTRAVENOUS

## 2022-09-25 MED ORDER — ROSUVASTATIN CALCIUM 20 MG PO TABS
20.0000 mg | ORAL_TABLET | Freq: Every day | ORAL | Status: DC
  Administered 2022-09-25 – 2022-09-29 (×5): 20 mg via ORAL
  Filled 2022-09-25 (×5): qty 1

## 2022-09-25 MED ORDER — SODIUM CHLORIDE 0.9 % IV SOLN
2.0000 g | Freq: Three times a day (TID) | INTRAVENOUS | Status: DC
  Administered 2022-09-25 – 2022-09-26 (×4): 2 g via INTRAVENOUS
  Filled 2022-09-25 (×4): qty 12.5

## 2022-09-25 MED ORDER — VANCOMYCIN HCL 1750 MG/350ML IV SOLN
1750.0000 mg | INTRAVENOUS | Status: DC
  Administered 2022-09-25 – 2022-09-29 (×5): 1750 mg via INTRAVENOUS
  Filled 2022-09-25 (×5): qty 350

## 2022-09-25 MED ORDER — INSULIN GLARGINE-YFGN 100 UNIT/ML ~~LOC~~ SOLN
10.0000 [IU] | Freq: Every day | SUBCUTANEOUS | Status: DC
  Administered 2022-09-25 – 2022-09-26 (×2): 10 [IU] via SUBCUTANEOUS
  Filled 2022-09-25 (×3): qty 0.1

## 2022-09-25 NOTE — Subjective & Objective (Signed)
Admitted last night due to AMS. Was hypotension. Discovered to have gluteal abscess. Pt's sister says that he has had abscess in same location on his buttocks about 5 or 6 times. Sister is requesting surgical consult.

## 2022-09-25 NOTE — Inpatient Diabetes Management (Addendum)
Inpatient Diabetes Program Recommendations  AACE/ADA: New Consensus Statement on Inpatient Glycemic Control (2015)  Target Ranges:  Prepandial:   less than 140 mg/dL      Peak postprandial:   less than 180 mg/dL (1-2 hours)      Critically ill patients:  140 - 180 mg/dL   Lab Results  Component Value Date   GLUCAP 235 (H) 09/25/2022   HGBA1C 12.4 (H) 09/25/2022    Review of Glycemic Control  Diabetes history: DM2 Outpatient Diabetes medications: Toujeo 40 units bid, Humalog 20 units tid meal coverage Current orders for Inpatient glycemic control: Novolog 0-15 units tid  Inpatient Diabetes Program Recommendations:   Noted patient was inpatient 08/28/22 when DM coordinator spoke with patient about elevated A1c of 14.1 and currently decreased to 12.4. Please consider when admitted: -Add Semglee 20 units bid -Add Novolog 5 units tid meal coverage when eating > 50% meals -Add Novolog 0-5 units hs correction  Thank you, Darel Hong E. Miking Usrey, RN, MSN, CDE  Diabetes Coordinator Inpatient Glycemic Control Team Team Pager [phone] (8am-5pm) 09/25/2022 1:40 PM

## 2022-09-25 NOTE — Evaluation (Signed)
Physical Therapy Evaluation Patient Details Name: Vincent [name] MRN: [id number] DOB: Dec 08, 1965 Today's Date: 09/25/2022  History of Present Illness  57 y.o. male presents to [facility] hospital on 09/24/2022 with AMS, found down by family with L weakness, facial droop and aphasia. CT and MRI negative for acute findings. PMHx: DM, peripheral neuropathy, GAD, chronic pain syndrome, bipolar 2 disorder, PTSD, RA, CAD, COPD, GERD, h/o bladder cancer and gastroparesis.  Clinical Impression  Pt presents to PT with deficits in functional mobility, gait, balance, strength, power, sensation, coordination. PT notes inconsistencies in strength with attempts at formal assessment and with observations during functional tasks, documented below in extremity assessment. Pt requires assistance to mobilize in bed and in standing, reporting L sided weakness and experiencing intermittent L knee buckling with transfer and ambulation attempts. Pt demonstrates ataxic movements of LUE and LLE when mobilizing throughout this session. PT will continue to follow in an effort to improve gait and balance. PT recommends short term inpatient PT services at the time of discharge due to high risk for falls and limited caregiver support.        Assistance Recommended at Discharge Frequent or constant Supervision/Assistance  If plan is discharge home, recommend the following:  Can travel by private vehicle  A lot of help with walking and/or transfers;A lot of help with bathing/dressing/bathroom;Assistance with cooking/housework;Assist for transportation;Help with stairs or ramp for entrance;Direct supervision/assist for medications management   No    Equipment Recommendations Wheelchair (measurements PT);Wheelchair cushion (measurements PT);BSC/3in1  Recommendations for Other Services       Functional Status Assessment Patient has had a recent decline in their functional status and demonstrates the ability to make significant  improvements in function in a reasonable and predictable amount of time.     Precautions / Restrictions Precautions Precautions: Fall Restrictions Weight Bearing Restrictions: No      Mobility  Bed Mobility Overal bed mobility: Needs Assistance Bed Mobility: Rolling, Sidelying to Sit, Sit to Supine Rolling: Min assist Sidelying to sit: Mod assist   Sit to supine: Min assist   General bed mobility comments: assist for LLE, assist to elevate trunk from sidelying to sitting    Transfers Overall transfer level: Needs assistance Equipment used: Rolling walker (2 wheels) Transfers: Sit to/from Stand, Bed to chair/wheelchair/BSC Sit to Stand: Min assist   Step pivot transfers: Min assist       General transfer comment: pt with impaired coordination of LUE when reaching for surfaces, intermittent L knee buckling with attempts at stepping    Ambulation/Gait Ambulation/Gait assistance: Min assist Gait Distance (Feet): 2 Feet (pt declines further distance due to reported fear of falling) Assistive device: Rolling walker (2 wheels) Gait Pattern/deviations: Step-to pattern, Ataxic, Decreased weight shift to left Gait velocity: reduced Gait velocity interpretation: <1.31 ft/sec, indicative of household Public librarian Bed    Modified Rankin (Stroke Patients Only)       Balance Overall balance assessment: Needs assistance Sitting-balance support: Single extremity supported, Feet supported Sitting balance-Leahy Scale: Poor Sitting balance - Comments: left lean Postural control: Left lateral lean Standing balance support: Bilateral upper extremity supported, Reliant on assistive device for balance Standing balance-Leahy Scale: Poor Standing balance comment: minG with BUE support of RW  Pertinent Vitals/Pain Pain Assessment Pain Assessment: No/denies pain    Home Living  Family/patient expects to be discharged to:: Private residence Living Arrangements: Other relatives (sister) Available Help at Discharge: Family Type of Home: Mobile home Home Access: Stairs to enter Entrance Stairs-Rails: Doctor, general practice of Steps: 5   Home Layout: One level Home Equipment: Rollator (4 wheels);Cane - single point Additional Comments: Sister lives with pt but works full-time    Prior Function Prior Level of Function : History of Falls (last six months);Independent/Modified Independent             Mobility Comments: mod I with use of rollator at all times, reports significant fall history ADLs Comments: mod I with dressing and toileting. Sister assists with showering and IADLs     Hand Dominance   Dominant Hand: Left    Extremity/Trunk Assessment   Upper Extremity Assessment Upper Extremity Assessment: LUE deficits/detail LUE Deficits / Details: inconsistencies noted in strength and coordination of LUE. Pt is barely able to lift LUE against gravity with attempts at formal strength assessment, yet pt is able to utilize LUE for support in weightbearing when leaned to left side at edge of bed. With use of walker pt often with ataxic movements. Pt reports decreased sensation to light touch.    Lower Extremity Assessment Lower Extremity Assessment: LLE deficits/detail LLE Deficits / Details: inconsistencies noted in strength and coordination of LUE. Pt breaks prior to resistance application at times during strength assessment. Pt noted to be tapping left foot up and down prior to formal strength assessment but then has great difficulty dorsiflexing during assessment. LLE Sensation: decreased light touch    Cervical / Trunk Assessment Cervical / Trunk Assessment: Normal  Communication   Communication: No difficulties  Cognition Arousal/Alertness: Awake/alert Behavior During Therapy: WFL for tasks assessed/performed Overall Cognitive Status: No  family/caregiver present to determine baseline cognitive functioning                                 General Comments: likely close to baseline, oriented x4        General Comments General comments (skin integrity, edema, etc.): pt with a history of orthostatic BP, often wears compression stockings but not on during this session. Pt with orthostatic BP from supine to sitting, 160/89 to 122/61. Pt incontinent of stool upon PT arrival, PT assists in hygiene tasks. Cyst on R buttock noted to continue to drain.    Exercises     Assessment/Plan    PT Assessment Patient needs continued PT services  PT Problem List Decreased strength;Decreased activity tolerance;Decreased balance;Decreased mobility;Decreased coordination;Decreased knowledge of use of DME;Decreased safety awareness;Impaired sensation       PT Treatment Interventions DME instruction;Gait training;Stair training;Functional mobility training;Therapeutic activities;Therapeutic exercise;Balance training;Neuromuscular re-education;Patient/family education    PT Goals (Current goals can be found in the Care Plan section)  Acute Rehab PT Goals Patient Stated Goal: to return to walking PT Goal Formulation: With patient Time For Goal Achievement: 10/09/22 Potential to Achieve Goals: Fair    Frequency Min 3X/week     Co-evaluation               AM-PAC PT "6 Clicks" Mobility  Outcome Measure Help needed turning from your back to your side while in a flat bed without using bedrails?: A Little Help needed moving from lying on your back to sitting on the side of a flat bed without using bedrails?:  A Lot Help needed moving to and from a bed to a chair (including a wheelchair)?: A Lot Help needed standing up from a chair using your arms (e.g., wheelchair or bedside chair)?: A Lot Help needed to walk in hospital room?: Total Help needed climbing 3-5 steps with a railing? : Total 6 Click Score: 11    End of  Session Equipment Utilized During Treatment: Gait belt Activity Tolerance: Patient tolerated treatment well Patient left: in bed;with call bell/phone within reach Nurse Communication: Mobility status PT Visit Diagnosis: Other abnormalities of gait and mobility (R26.89);Muscle weakness (generalized) (M62.81);Other symptoms and signs involving the nervous system (R29.898);Ataxic gait (R26.0)    Time: 4401-0272 PT Time Calculation (min) (ACUTE ONLY): 33 min   Charges:   PT Evaluation $PT Eval Low Complexity: 1 Low   PT General Charges $$ ACUTE PT VISIT: 1 Visit         Arlyss Gandy, PT, DPT Acute Rehabilitation Office [phone]   Arlyss Gandy 09/25/2022, 1:39 PM

## 2022-09-25 NOTE — Assessment & Plan Note (Signed)
Sister states pt's BP has been running low at home and he has stopped taking all his HTN meds.

## 2022-09-25 NOTE — Assessment & Plan Note (Signed)
Due to hypotension. Improved with IVF.

## 2022-09-25 NOTE — Progress Notes (Signed)
Pharmacy Antibiotic Note  Vincent [name] is a 57 y.o. male admitted on 09/24/2022 with sepsis/pilonidal abscess.  Pharmacy has been consulted for Vancomycin  dosing.  Vancomycin 2 g IV given in ED at  1900  Plan: Vancomycin 1750 mg IV q24h  Height: 6' (182.9 cm) Weight: 89.4 kg (197 lb 1.5 oz) IBW/kg (Calculated) : 77.6  Temp (24hrs), Avg:96.7 F (35.9 C), Min:96.6 F (35.9 C), Max:96.8 F (36 C)  Recent Labs  Lab 09/24/22 1555 09/24/22 1604 09/24/22 1742  WBC 7.6  --   --   CREATININE 1.48* 1.50*  --   LATICACIDVEN  --   --  1.6    Estimated Creatinine Clearance: 61.1 mL/min (A) (by C-G formula based on SCr of 1.5 mg/dL (H)).    Allergies  Allergen Reactions   Celecoxib Anaphylaxis, Swelling and Rash    Tongue swells   Hydrocodone Rash and Other (See Comments)    "blisters developed on arms"    Sulfa Antibiotics Rash   Sulfacetamide Sodium Rash     Vincent [name] 09/25/2022 12:16 AM

## 2022-09-25 NOTE — ED Notes (Signed)
ED TO INPATIENT HANDOFF REPORT  ED Nurse Name and Phone #: George Haggart, RN [phone]  S Name/Age/Gender Vincent [name] 57 y.o. male Room/Bed: 041C/041C  Code Status   Code Status: Full Code  Home/SNF/Other Home Patient oriented to: self, place, time, and situation Is this baseline? Yes   Triage Complete: Triage complete  Chief Complaint Acute encephalopathy [G93.40]  Triage Note Pt BIB EMS S CODE STROKE. Lsn 1430. DEFICITS- left sided weakness, aphasia, and left sided facial droop.    Allergies Allergies  Allergen Reactions   Celecoxib Anaphylaxis, Swelling and Rash    Tongue swells   Hydrocodone Rash and Other (See Comments)    "blisters developed on arms"    Sulfa Antibiotics Rash   Sulfacetamide Sodium Rash    Level of Care/Admitting Diagnosis ED Disposition     ED Disposition  Admit   Condition  --   Comment  Hospital [facility]: [facility] [[id number]]  Level of Care: Progressive [102]  Admit to Progressive based on following criteria: NEUROLOGICAL AND NEUROSURGICAL complex patients with significant risk of instability, who do not meet ICU criteria, yet require close observation or frequent assessment (< / = every 2 - 4 hours) with medical / nursing intervention.  Admit to Progressive based on following criteria: ACUTE MENTAL DISORDER-RELATED Drug/Alcohol Ingestion/Overdose/Withdrawal, Suicidal Ideation/attempt requiring safety sitter and < Q2h monitoring/assessments, moderate to severe agitation that is managed with medication/sitter, CIWA-Ar score < 20.  May admit patient to [facility] if equivalent level of care is available:: Yes  Covid Evaluation: Asymptomatic - no recent exposure (last 10 days) testing not required  Diagnosis: Acute encephalopathy [960454]  Admitting Physician: Cathleen Corti [[id number]]  Attending Physician: Cathleen Corti [[id number]]  Certification:: I certify this patient will need inpatient services for at  least 2 midnights  Estimated Length of Stay: 2          B Medical/Surgery History Past Medical History:  Diagnosis Date   Anxiety    Benign essential tremor    Bipolar 2 disorder (HCC)    followed by [facility]--- dr s. Lolly Mustache   Bladder cancer [facility])    recurrent   CAD (coronary artery disease)    cardiac cath 2003  and 2011 both showed normal coronary arteries w/ preserved lvf;  Non obstructive on CTA Oct 2019.    Chronic pain syndrome    back---- [facility] [place]   Cold extremities    BLE   COPD (chronic obstructive pulmonary disease) (HCC)    DDD (degenerative disc disease), lumbar    Diabetic peripheral neuropathy (HCC)    Gastroparesis    followed by dr Marina Goodell   GERD (gastroesophageal reflux disease)    Hiatal hernia    History of bladder cancer urologist-  previously dr Ronal Fear;  now dr gay   papillay TCC (Ta G1)  s/p TURBT and chemo instillation 2014   History of chest pain 12/2017   heart cath normal   History of encephalopathy 05/27/2015   admission w/ acute encephalopathy thought to be secondary to pain meds and COPD   History of gastric ulcer    History of Helicobacter pylori infection    History of kidney stones    History of TIA (transient ischemic attack) 2008  and 10-19-2018    no residual's   History of traumatic head injury 2010   w/ LOC  per pt needed stitches, hit in head with a mower blade   Hyperlipidemia  Hypertension    Hypogonadism male    s/p  bilateral orchiectomy   Hypothyroidism    Insomnia    Mild obstructive sleep apnea    study in epic 12-04-2016, no cpap   PTSD (post-traumatic stress disorder)    chronic   PTSD (post-traumatic stress disorder)    RA (rheumatoid arthritis) (HCC)    followed by [facility]   Seizures, transient [facility]) neurologist-  dr Terrace Arabia--  differential dx complex partial seizure .vs.  mood disorder .vs.  pseudoseizure--  negative EEG's   confusion episodes and staring spells since 11/  2015   (03-26-2020 per pt wife last seizure 10 /2021)   Transient confusion NEUOROLOGIST-  DR Terrace Arabia   Episodes since 11/ 2015--  neurologist dx  differential complex partial seizure  .vs. mood disorder . vs. pseudoseizure   Type 2 diabetes mellitus treated with insulin [facility])    endocrinologist--- dr Everardo All---  (03-26-2020 pt does not check blood sugar at home)   Past Surgical History:  Procedure Laterality Date   AMPUTATION Left 04/28/2020   Procedure: LEFT LITTLE FINGER AMPUTATION;  Surgeon: Nadara Mustard, MD;  Location: [facility] OR;  Service: Orthopedics;  Laterality: Left;   CARDIAC CATHETERIZATION  12-27-2001  DR Jacinto Halim  &  05-26-2009  DR Eldridge Dace   RESULTS FOR BOTH ARE NORMAL CORONARIES AND PERSERVED LVF/ EF 60%   CARPAL TUNNEL RELEASE Bilateral right 09-16-2003;  left ?   CARPAL TUNNEL RELEASE Left 02/25/2015   Procedure: LEFT CARPAL TUNNEL RELEASE;  Surgeon: Betha Loa, MD;  Location: [facility];  Service: Orthopedics;  Laterality: Left;   CYSTOSCOPY N/A 10/10/2012   Procedure: CYSTOSCOPY CLOT EVACUATION FULGERATION OF BLEEDERS ;  Surgeon: Garnett Farm, MD;  Location: [facility];  Service: Urology;  Laterality: N/A;   CYSTOSCOPY WITH BIOPSY N/A 11/26/2015   Procedure: CYSTOSCOPY WITH BIOPSY AND FULGURATION;  Surgeon: Ihor Gully, MD;  Location[facility];  Service: Urology;  Laterality: N/A;   ESOPHAGOGASTRODUODENOSCOPY  2014   LAPAROSCOPIC CHOLECYSTECTOMY  11-17-2010   ORCHIECTOMY Right 02/21/2016   Procedure: SCROTAL ORCHIECTOMY with TESTICULAR PROSTHESIS IMPLANT;  Surgeon: Ihor Gully, MD;  Location: [facility];  Service: Urology;  Laterality: Right;   ORCHIECTOMY Left 09/02/2018   Procedure: ORCHIECTOMY;  Surgeon: Ihor Gully, MD;  Location: [facility];  Service: Urology;  Laterality: Left;   ROTATOR CUFF REPAIR Right 12/2004   TRANSURETHRAL RESECTION OF BLADDER TUMOR N/A 08/09/2012   Procedure:  TRANSURETHRAL RESECTION OF BLADDER TUMOR (TURBT) WITH GYRUS WITH MITOMYCIN C;  Surgeon: Garnett Farm, MD;  Location: [facility];  Service: Urology;  Laterality: N/A;   TRANSURETHRAL RESECTION OF BLADDER TUMOR N/A 03/29/2020   Procedure: TRANSURETHRAL RESECTION OF BLADDER TUMOR (TURBT) and post-op instillation of gemcitabine;  Surgeon: [name], MD;  Location: [facility];  Service: Urology;  Laterality: N/A;   TRANSURETHRAL RESECTION OF BLADDER TUMOR WITH GYRUS (TURBT-GYRUS) N/A 02/27/2014   Procedure: TRANSURETHRAL RESECTION OF BLADDER TUMOR WITH GYRUS (TURBT-GYRUS);  Surgeon: Garnett Farm, MD;  Location: [facility];  Service: Urology;  Laterality: N/A;     A IV Location/Drains/Wounds Patient Lines/Drains/Airways Status     Active Line/Drains/Airways     Name Placement date Placement time Site Days   Peripheral IV 09/24/22 18 G Anterior;Proximal;Right Forearm 09/24/22  1648  Forearm  1   External Urinary Catheter 09/24/22  2313  --  1   Wound / Incision (  Open or Dehisced) Finger (Comment which one) Right;Other (Comment) opened non healing wound between index and thumb of right hand --  --  Finger (Comment which one)  --            Intake/Output Last 24 hours  Intake/Output Summary (Last 24 hours) at 09/25/2022 1354 Last data filed at 09/25/2022 0956 Gross per 24 hour  Intake 500 ml  Output --  Net 500 ml    Labs/Imaging Results for orders placed or performed during the hospital encounter of 09/24/22 (from the past 48 hour(s))  Protime-INR     Status: None   Collection Time: 09/24/22  3:55 PM  Result Value Ref Range   Prothrombin Time 12.8 11.4 - 15.2 seconds   INR 0.9 0.8 - 1.2    Comment: (NOTE) INR goal varies based on device and disease states. Performed at [facility], [address] [place], [place] [address]   APTT     Status: None   Collection Time: 09/24/22  3:55 PM  Result Value Ref Range   aPTT  30 24 - 36 seconds    Comment: Performed at [facility], [address] [place], [place] [address]  CBC     Status: Abnormal   Collection Time: 09/24/22  3:55 PM  Result Value Ref Range   WBC 7.6 4.0 - 10.5 K/uL   RBC 4.69 4.22 - 5.81 MIL/uL   Hemoglobin 12.6 (L) 13.0 - 17.0 g/dL   HCT 84.1 (L) 32.4 - 40.1 %   MCV 79.7 (L) 80.0 - 100.0 fL   MCH 26.9 26.0 - 34.0 pg   MCHC 33.7 30.0 - 36.0 g/dL   RDW 02.7 25.3 - 66.4 %   Platelets 328 150 - 400 K/uL   nRBC 0.0 0.0 - 0.2 %    Comment: Performed at [facility], [address], [place], [place] [address]  Differential     Status: Abnormal   Collection Time: 09/24/22  3:55 PM  Result Value Ref Range   Neutrophils Relative % 47 %   Neutro Abs 3.6 1.7 - 7.7 K/uL   Lymphocytes Relative 39 %   Lymphs Abs 3.0 0.7 - 4.0 K/uL   Monocytes Relative 9 %   Monocytes Absolute 0.7 0.1 - 1.0 K/uL   Eosinophils Relative 2 %   Eosinophils Absolute 0.1 0.0 - 0.5 K/uL   Basophils Relative 1 %   Basophils Absolute 0.1 0.0 - 0.1 K/uL   Immature Granulocytes 2 %   Abs Immature Granulocytes 0.12 (H) 0.00 - 0.07 K/uL    Comment: Performed at [facility], [address], [place], [place] [address]  Comprehensive metabolic panel     Status: Abnormal   Collection Time: 09/24/22  3:55 PM  Result Value Ref Range   Sodium 139 135 - 145 mmol/L   Potassium 4.0 3.5 - 5.1 mmol/L   Chloride 105 98 - 111 mmol/L   CO2 20 (L) 22 - 32 mmol/L   Glucose, Bld 127 (H) 70 - 99 mg/dL    Comment: Glucose reference range applies only to samples taken after fasting for at least 8 hours.   BUN 20 6 - 20 mg/dL   Creatinine, Ser 6.38 (H) 0.61 - 1.24 mg/dL   Calcium 9.3 8.9 - 75.6 mg/dL   Total Protein 6.3 (L) 6.5 - 8.1 g/dL   Albumin 3.2 (L) 3.5 - 5.0 g/dL   AST 20 15 - 41 U/L  ALT 16 0 - 44 U/L   Alkaline Phosphatase 72 38 - 126 U/L   Total Bilirubin 0.6 0.3 - 1.2 mg/dL   GFR, Estimated 56 (L) >60 mL/min    Comment: (NOTE) Calculated using the  CKD-EPI Creatinine Equation (2021)    Anion gap 14 5 - 15    Comment: Performed at [facility], [address], [place], [place] [address]  Ethanol     Status: None   Collection Time: 09/24/22  3:55 PM  Result Value Ref Range   Alcohol, Ethyl (B) <10 <10 mg/dL    Comment: (NOTE) Lowest detectable limit for serum alcohol is 10 mg/dL.  For medical purposes only. Performed at [facility], [address], [place], [place] [address]   CBG monitoring, ED     Status: Abnormal   Collection Time: 09/24/22  3:57 PM  Result Value Ref Range   Glucose-Capillary 119 (H) 70 - 99 mg/dL    Comment: Glucose reference range applies only to samples taken after fasting for at least 8 hours.   Comment 1 Notify RN    Comment 2 Document in Chart   I-stat chem 8, ED     Status: Abnormal   Collection Time: 09/24/22  4:04 PM  Result Value Ref Range   Sodium 139 135 - 145 mmol/L   Potassium 4.0 3.5 - 5.1 mmol/L   Chloride 108 98 - 111 mmol/L   BUN 24 (H) 6 - 20 mg/dL   Creatinine, Ser 0.98 (H) 0.61 - 1.24 mg/dL   Glucose, Bld 119 (H) 70 - 99 mg/dL    Comment: Glucose reference range applies only to samples taken after fasting for at least 8 hours.   Calcium, Ion 1.15 1.15 - 1.40 mmol/L   TCO2 22 22 - 32 mmol/L   Hemoglobin 11.6 (L) 13.0 - 17.0 g/dL   HCT 14.7 (L) 82.9 - 56.2 %  Urinalysis, Routine w reflex microscopic -Urine, Clean Catch     Status: Abnormal   Collection Time: 09/24/22  5:29 PM  Result Value Ref Range   Color, Urine YELLOW YELLOW   APPearance CLOUDY (A) CLEAR   Specific Gravity, Urine 1.027 1.005 - 1.030   pH 6.0 5.0 - 8.0   Glucose, UA >=500 (A) NEGATIVE mg/dL   Hgb urine dipstick SMALL (A) NEGATIVE   Bilirubin Urine NEGATIVE NEGATIVE   Ketones, ur NEGATIVE NEGATIVE mg/dL   Protein, ur 130 (A) NEGATIVE mg/dL   Nitrite NEGATIVE NEGATIVE   Leukocytes,Ua LARGE (A) NEGATIVE   RBC / HPF 6-10 0 - 5 RBC/hpf   WBC, UA >50 0 - 5 WBC/hpf   Bacteria, UA RARE (A) NONE SEEN    Squamous Epithelial / HPF 0-5 0 - 5 /HPF   WBC Clumps PRESENT    Mucus PRESENT    Hyaline Casts, UA PRESENT     Comment: Performed at [facility], [address] [place], [place] [address]  Rapid urine drug screen (hospital performed)     Status: None   Collection Time: 09/24/22  5:29 PM  Result Value Ref Range   Opiates NONE DETECTED NONE DETECTED   Cocaine NONE DETECTED NONE DETECTED   Benzodiazepines NONE DETECTED NONE DETECTED   Amphetamines NONE DETECTED NONE DETECTED   Tetrahydrocannabinol NONE DETECTED NONE DETECTED   Barbiturates NONE DETECTED NONE DETECTED    Comment: (NOTE) DRUG SCREEN FOR MEDICAL PURPOSES ONLY.  IF CONFIRMATION IS NEEDED FOR ANY PURPOSE, NOTIFY LAB WITHIN 5 DAYS.  LOWEST DETECTABLE LIMITS FOR URINE DRUG SCREEN Drug Class                     Cutoff (ng/mL) Amphetamine and metabolites    1000 Barbiturate and metabolites    200 Benzodiazepine                 200 Opiates and metabolites        300 Cocaine and metabolites        300 THC                            50 Performed at [facility], [address], [place], [place] [address]   Lactic acid, plasma     Status: None   Collection Time: 09/24/22  5:42 PM  Result Value Ref Range   Lactic Acid, Venous 1.6 0.5 - 1.9 mmol/L    Comment: Performed at [facility], [address], [place], [place] [address]  Blood culture (routine x 2)     Status: None (Preliminary result)   Collection Time: 09/24/22  6:18 PM   Specimen: BLOOD LEFT HAND  Result Value Ref Range   Specimen Description BLOOD LEFT HAND    Special Requests      BOTTLES DRAWN AEROBIC AND ANAEROBIC Blood Culture results may not be optimal due to an inadequate volume of blood received in culture bottles   Culture      NO GROWTH < 24 HOURS Performed at [facility], [address] [place], [place] [address]    Report Status PENDING   Blood culture (routine x 2)     Status: None (Preliminary result)    Collection Time: 09/24/22  6:34 PM   Specimen: BLOOD LEFT ARM  Result Value Ref Range   Specimen Description BLOOD LEFT ARM    Special Requests      BOTTLES DRAWN AEROBIC AND ANAEROBIC Blood Culture results may not be optimal due to an inadequate volume of blood received in culture bottles   Culture      NO GROWTH < 24 HOURS Performed at [facility], [address] [place], [place] [address]    Report Status PENDING   CBG monitoring, ED     Status: Abnormal   Collection Time: 09/24/22  9:48 PM  Result Value Ref Range   Glucose-Capillary 145 (H) 70 - 99 mg/dL    Comment: Glucose reference range applies only to samples taken after fasting for at least 8 hours.  Troponin I (High Sensitivity)     Status: None   Collection Time: 09/25/22 12:02 AM  Result Value Ref Range   Troponin I (High Sensitivity) 5 <18 ng/L    Comment: (NOTE) Elevated high sensitivity troponin I (hsTnI) values and significant  changes across serial measurements may suggest ACS but many other  chronic and acute conditions are known to elevate hsTnI results.  Refer to the "Links" section for chest pain algorithms and additional  guidance. Performed at [facility], [address] [place], [place] [address]   Hemoglobin A1c     Status: Abnormal   Collection Time: 09/25/22 12:02 AM  Result Value Ref Range   Hgb A1c MFr Bld 12.4 (H) 4.8 - 5.6 %    Comment: (NOTE) Pre diabetes:          5.7%-6.4%  Diabetes:              >  6.4%  Glycemic control for   <7.0% adults with diabetes    Mean Plasma Glucose 309.18 mg/dL    Comment: Performed at [facility], [address] [place], [place] [address]  Comprehensive metabolic panel     Status: Abnormal   Collection Time: 09/25/22  2:02 AM  Result Value Ref Range   Sodium 136 135 - 145 mmol/L   Potassium 4.1 3.5 - 5.1 mmol/L   Chloride 105 98 - 111 mmol/L   CO2 20 (L) 22 - 32 mmol/L   Glucose, Bld 152 (H) 70 - 99 mg/dL    Comment: Glucose  reference range applies only to samples taken after fasting for at least 8 hours.   BUN 21 (H) 6 - 20 mg/dL   Creatinine, Ser 3.08 0.61 - 1.24 mg/dL   Calcium 8.7 (L) 8.9 - 10.3 mg/dL   Total Protein 5.0 (L) 6.5 - 8.1 g/dL   Albumin 2.7 (L) 3.5 - 5.0 g/dL   AST 17 15 - 41 U/L   ALT 17 0 - 44 U/L   Alkaline Phosphatase 65 38 - 126 U/L   Total Bilirubin 0.7 0.3 - 1.2 mg/dL   GFR, Estimated >65 >78 mL/min    Comment: (NOTE) Calculated using the CKD-EPI Creatinine Equation (2021)    Anion gap 11 5 - 15    Comment: Performed at [facility], [address], [place], [place] [address]  CBC     Status: Abnormal   Collection Time: 09/25/22  2:02 AM  Result Value Ref Range   WBC 7.0 4.0 - 10.5 K/uL   RBC 4.03 (L) 4.22 - 5.81 MIL/uL   Hemoglobin 11.2 (L) 13.0 - 17.0 g/dL   HCT 95.2 (L) 84.1 - 32.4 %   MCV 83.9 80.0 - 100.0 fL   MCH 27.8 26.0 - 34.0 pg   MCHC 33.1 30.0 - 36.0 g/dL   RDW 40.1 02.7 - 25.3 %   Platelets 247 150 - 400 K/uL   nRBC 0.0 0.0 - 0.2 %    Comment: Performed at [facility], [address], [place], [place] [address]  Lipid panel     Status: Abnormal   Collection Time: 09/25/22  2:02 AM  Result Value Ref Range   Cholesterol 275 (H) 0 - 200 mg/dL   Triglycerides 347 (H) <150 mg/dL   HDL 52 >42 mg/dL   Total CHOL/HDL Ratio 5.3 RATIO   VLDL 59 (H) 0 - 40 mg/dL   LDL Cholesterol 595 (H) 0 - 99 mg/dL    Comment:        Total Cholesterol/HDL:CHD Risk Coronary Heart Disease Risk Table                     Men   Women  1/2 Average Risk   3.4   3.3  Average Risk       5.0   4.4  2 X Average Risk   9.6   7.1  3 X Average Risk  23.4   11.0        Use the calculated Patient Ratio above and the CHD Risk Table to determine the patient's CHD Risk.        ATP III CLASSIFICATION (LDL):  <100     mg/dL   Optimal  638-756  mg/dL   Near or Above                    Optimal  130-159  mg/dL   Borderline  604-540  mg/dL   High  >981     mg/dL   Very  High Performed at [facility], [address] [place], [place] [address]   TSH     Status: None   Collection Time: 09/25/22  2:02 AM  Result Value Ref Range   TSH 2.731 0.350 - 4.500 uIU/mL    Comment: Performed by a 3rd Generation assay with a functional sensitivity of <=0.01 uIU/mL. Performed at [facility], [address], [place], [place] [address]   CBG monitoring, ED     Status: Abnormal   Collection Time: 09/25/22  8:03 AM  Result Value Ref Range   Glucose-Capillary 231 (H) 70 - 99 mg/dL    Comment: Glucose reference range applies only to samples taken after fasting for at least 8 hours.  CBG monitoring, ED     Status: Abnormal   Collection Time: 09/25/22 11:11 AM  Result Value Ref Range   Glucose-Capillary 235 (H) 70 - 99 mg/dL    Comment: Glucose reference range applies only to samples taken after fasting for at least 8 hours.   MR CERVICAL SPINE W WO CONTRAST  Result Date: 09/25/2022 CLINICAL DATA:  Myelopathy, rule out abscess EXAM: MRI CERVICAL SPINE WITHOUT AND WITH CONTRAST TECHNIQUE: Multiplanar and multiecho pulse sequences of the cervical spine, to include the craniocervical junction and cervicothoracic junction, were obtained without and with intravenous contrast. CONTRAST:  9mL GADAVIST GADOBUTROL 1 MMOL/ML IV SOLN COMPARISON:  05/25/2022 FINDINGS: Evaluation is somewhat limited by motion artifact. Alignment: No significant listhesis. Vertebrae: No acute fracture, evidence of discitis, or suspicious osseous lesion. No abnormal enhancement. Cord: Normal signal and morphology.  No abnormal enhancement. Posterior Fossa, vertebral arteries, paraspinal tissues: Negative. Disc levels: C2-C3: No significant disc bulge. No spinal canal stenosis or neuroforaminal narrowing. C3-C4: No significant disc bulge. Facet arthropathy, with fused left facets that are better seen on the prior exam. No spinal canal stenosis or neural foraminal narrowing. C4-C5: No significant disc  bulge. No spinal canal stenosis or neuroforaminal narrowing. C5-C6: Mild disc bulge, which indents the ventral thecal sac but does not deform the spinal cord. No spinal canal stenosis or neural foraminal narrowing. C6-C7: Minimal disc bulge. Facet and uncovertebral hypertrophy. No spinal canal stenosis. No neural foraminal narrowing. C7-T1: No significant disc bulge. No spinal canal stenosis or neuroforaminal narrowing. IMPRESSION: 1. No evidence of discitis osteomyelitis or epidural abscess. 2. No spinal canal stenosis or neural foraminal narrowing. Electronically Signed   By: Wiliam Ke M.D.   On: 09/25/2022 03:25   DG Chest 2 View  Result Date: 09/25/2022 CLINICAL DATA:  Stroke EXAM: CHEST - 2 VIEW COMPARISON:  Radiograph 08/25/2022 and CT 09/24/2022 FINDINGS: Stable cardiomediastinal silhouette. Aortic atherosclerotic calcification. Low lung volumes accentuated pulmonary vascularity. No focal consolidation, pleural effusion, or pneumothorax. No displaced rib fractures. IMPRESSION: Low lung volumes without acute cardiopulmonary process. Electronically Signed   By: Minerva Fester M.D.   On: 09/25/2022 00:31   MR BRAIN WO CONTRAST  Result Date: 09/24/2022 CLINICAL DATA:  Headache, neuro deficit EXAM: MRI HEAD WITHOUT CONTRAST TECHNIQUE: Multiplanar, multiecho pulse sequences of the brain and surrounding structures were obtained without intravenous contrast. COMPARISON:  08/27/2022 MRI head, correlation is also made with 09/24/2022 CT head FINDINGS: Evaluation is somewhat limited by motion artifact. Brain: No restricted diffusion to suggest acute or subacute infarct. No acute hemorrhage, mass, mass effect, or midline shift. No hydrocephalus or extra-axial collection.  Normal pituitary and craniocervical junction. No hemosiderin deposition to suggest remote hemorrhage. Normal cerebral volume for age. Vascular: Normal arterial flow voids. Skull and upper cervical spine: Normal marrow signal. Sinuses/Orbits:  Clear paranasal sinuses. No acute finding in the orbits. Other: The mastoid air cells are well aerated. IMPRESSION: No acute intracranial process. No evidence of acute or subacute infarct. Electronically Signed   By: Wiliam Ke M.D.   On: 09/24/2022 22:59   CT ANGIO HEAD NECK W WO CM  Result Date: 09/24/2022 CLINICAL DATA:  Neuro deficit, acute, stroke suspected. EXAM: CT ANGIOGRAPHY HEAD AND NECK WITH AND WITHOUT CONTRAST TECHNIQUE: Multidetector CT imaging of the head and neck was performed using the standard protocol during bolus administration of intravenous contrast. Multiplanar CT image reconstructions and MIPs were obtained to evaluate the vascular anatomy. Carotid stenosis measurements (when applicable) are obtained utilizing NASCET criteria, using the distal internal carotid diameter as the denominator. RADIATION DOSE REDUCTION: This exam was performed according to the departmental dose-optimization program which includes automated exposure control, adjustment of the mA and/or kV according to patient size and/or use of iterative reconstruction technique. CONTRAST:  OMNIPAQUE IOHEXOL 350 MG/ML SOLN COMPARISON:  CT angio head and neck 06/10/2020 FINDINGS: CTA NECK FINDINGS Aortic arch: Atherosclerotic calcifications are present at the aortic arch and great vessel origins. No significant stenosis of greater than 50% is present relative to the more distal vessels. Right carotid system: Atherosclerotic changes are noted along the right common carotid artery without significant stenosis. Calcifications are present at the aortic arch and proximal right ICA without significant stenosis. The more distal right ICA is normal. No significant interval change is present. Left carotid system: The left common carotid artery demonstrates atherosclerotic changes without significant stenosis. Calcifications are present at the aortic arch and proximal left ICA without a significant stenosis relative to the more  distal vessel. Vertebral arteries: The right vertebral artery is the dominant vessel. Atherosclerotic calcifications are present in the V1 segment. No significant stenosis is present in either vertebral artery in the neck. Skeleton: No acute abnormalities are present. No focal osseous lesions are present. The patient is edentulous. Other neck: Soft tissues the neck are otherwise unremarkable. Salivary glands are within normal limits. Thyroid is normal. No significant adenopathy is present. No focal mucosal or submucosal lesions are present. Upper chest: The lung apices are clear. The thoracic inlet is within normal limits. Review of the MIP images confirms the above findings CTA HEAD FINDINGS Anterior circulation: Atherosclerotic calcifications are present within the cavernous internal carotid arteries bilaterally without a significant stenosis relative to the more distal vessels. The A1 and M1 segments are normal. The anterior communicating artery is patent. MCA bifurcations are within normal limits. ACA and MCA branch vessels are normal. No aneurysm is present. Posterior circulation: The right vertebral artery is the dominant vessel. PICA origin is visualized and normal. Scratched at the PICA origins are visualized and normal. The vertebrobasilar junction and basilar artery normal. Both posterior cerebral arteries scratched at the superior cerebellar arteries are patent. Both posterior cerebral arteries originate from the basilar tip. The PCA branch vessels are normal bilaterally. Venous sinuses: The dural sinuses are patent. The straight sinus and deep cerebral veins are intact. Cortical veins are within normal limits. No significant vascular malformation is evident. Review of the MIP images confirms the above findings IMPRESSION: 1. No significant proximal stenosis, aneurysm, or branch vessel occlusion within the Circle of Willis. 2. Stable atherosclerotic calcifications within the cavernous internal carotid  arteries without significant stenosis. 3. Stable atherosclerotic changes within the cavernous internal carotid arteries bilaterally without significant stenosis relative to the more distal vessels. 4.  Aortic Atherosclerosis (ICD10-I70.0). Electronically Signed   By: Marin Roberts M.D.   On: 09/24/2022 17:35   CT Angio Chest Aorta W and/or Wo Contrast  Result Date: 09/24/2022 CLINICAL DATA:  Neuro deficit, acute aortic syndrome suspected EXAM: CT ANGIOGRAPHY CHEST, ABDOMEN AND PELVIS TECHNIQUE: Non-contrast CT of the chest was initially obtained. Multidetector CT imaging through the chest, abdomen and pelvis was performed using the standard protocol during bolus administration of intravenous contrast. Multiplanar reconstructed images and MIPs were obtained and reviewed to evaluate the vascular anatomy. RADIATION DOSE REDUCTION: This exam was performed according to the departmental dose-optimization program which includes automated exposure control, adjustment of the mA and/or kV according to patient size and/or use of iterative reconstruction technique. CONTRAST:  OMNIPAQUE IOHEXOL 350 MG/ML SOLN COMPARISON:  Chest radiograph 08/25/2022; CT abdomen and pelvis 06/21/2022; CTA chest abdomen and pelvis 06/10/2020 FINDINGS: CTA CHEST FINDINGS Cardiovascular: Normal heart size. No pericardial effusion. No acute aortic syndrome. Aortic atherosclerotic and coronary artery atherosclerotic calcification. Mediastinum/Nodes: Unremarkable esophagus. Secretions in the posterior trachea near the thoracic inlet. No thoracic adenopathy. Lungs/Pleura: Lungs are clear. No pleural effusion or pneumothorax. Mild diffuse bronchial wall thickening. Mild mosaic attenuation of the lungs compatible with air trapping. Musculoskeletal: No chest wall abnormality. No acute or significant osseous findings. Review of the MIP images confirms the above findings. CTA ABDOMEN AND PELVIS FINDINGS VASCULAR Aorta: Moderate calcified  plaque causes up to mild narrowing near the bifurcation. No aneurysm or dissection. Celiac: Patent without aneurysm or dissection. SMA: Patent without aneurysm or dissection. Renals: Calcified plaque at the origin of the right renal artery causes moderate narrowing. Patent left renal artery. No aneurysm or dissection IMA: Patent. Inflow: Scattered calcified atherosclerotic plaque causing multifocal areas of mild narrowing. No aneurysm or dissection. Veins: Patent portal vein. Review of the MIP images confirms the above findings. NON-VASCULAR Hepatobiliary: Cholecystectomy. Hepatic steatosis. No biliary dilation. Pancreas: Unremarkable. No pancreatic ductal dilatation or surrounding inflammatory changes. Spleen: Normal in size without focal abnormality. Adrenals/Urinary Tract: Unremarkable adrenal glands and kidneys. No hydronephrosis or urinary calculi. Unremarkable bladder. Stomach/Bowel: Stomach is within normal limits. Normal caliber large and small bowel. Moderate colonic stool load. Normal appendix. Lymphatic: No lymphadenopathy. Reproductive: No acute abnormality. Other: Subcutaneous peripherally enhancing gas and fluid collection along the right posterior gluteal cleft measuring 3.5 x 2.3 cm (6/344). Mild adjacent stranding. No free intraperitoneal fluid or air. Musculoskeletal: No acute osseous abnormality. Review of the MIP images confirms the above findings. IMPRESSION: 1. No acute aortic syndrome. 2. Subcutaneous peripherally enhancing gas and fluid collection along the right posterior gluteal cleft measuring 3.5 x 2.3 cm, concerning for abscess. 3. Diffuse bronchial wall thickening. Layering secretions in the trachea near the thoracic inlet. Query aspiration. 4. Hepatic steatosis. 5. Moderate stool load.  Correlate for constipation. Aortic Atherosclerosis (ICD10-I70.0). Electronically Signed   By: Minerva Fester M.D.   On: 09/24/2022 17:27   CT HEAD CODE STROKE WO CONTRAST  Result Date:  09/24/2022 CLINICAL DATA:  Code stroke. Neuro deficit, acute, stroke suspected. EXAM: CT HEAD WITHOUT CONTRAST TECHNIQUE: Contiguous axial images were obtained from the base of the skull through the vertex without intravenous contrast. RADIATION DOSE REDUCTION: This exam was performed according to the departmental dose-optimization program which includes automated exposure control, adjustment of the mA and/or kV according to patient size and/or use of iterative  reconstruction technique. COMPARISON:  CT head without contrast 07/18/2022. MR head without contrast 08/27/2022 FINDINGS: Brain: No acute infarct, hemorrhage, or mass lesion is present. No significant white matter lesions are present. Deep brain nuclei are within normal limits. Insular ribbon is normal bilaterally. The ventricles are of normal size. No significant extraaxial fluid collection is present. The brainstem and cerebellum are within normal limits. Midline structures are within normal limits. Vascular: Atherosclerotic calcifications are present within the cavernous internal carotid arteries bilaterally. No hyperdense vessel is present. Skull: Calvarium is intact. No focal lytic or blastic lesions are present. No significant extracranial soft tissue lesion is present. Sinuses/Orbits: The paranasal sinuses and mastoid air cells are clear. The globes and orbits are within normal limits. ASPECTS [facility] Stroke Program Early CT Score) - Ganglionic level infarction (caudate, lentiform nuclei, internal capsule, insula, M1-M3 cortex): 7/7 - Supraganglionic infarction (M4-M6 cortex): 3/3 Total score (0-10 with 10 being normal): 10/10 IMPRESSION: 1. Negative CT of the head. 2. Aspects is 10/10. The above was relayed via text pager to Dr. Otelia Limes on 09/24/2022 at 16:15 . Electronically Signed   By: Marin Roberts M.D.   On: 09/24/2022 16:17    Pending Labs Unresulted Labs (From admission, onward)    None       Vitals/Pain Today's Vitals    09/25/22 0845 09/25/22 0900 09/25/22 1202 09/25/22 1204  BP: 129/69 131/64 122/61   Pulse: 85 86 80   Resp: 16 16 (!) 21   Temp:    97.7 F (36.5 C)  TempSrc:    Oral  SpO2: 100% 99% 99%   Weight:      Height:      PainSc:        Isolation Precautions No active isolations  Medications Medications  sodium chloride flush (NS) 0.9 % injection 3 mL (3 mLs Intravenous Not Given 09/24/22 1648)  acetaminophen (TYLENOL) tablet 650 mg (has no administration in time range)  hydroxychloroquine (PLAQUENIL) tablet 200 mg (200 mg Oral Given 09/25/22 1036)  DULoxetine (CYMBALTA) DR capsule 60 mg (60 mg Oral Given 09/25/22 0956)  lurasidone (LATUDA) tablet 40 mg (40 mg Oral Given 09/24/22 2140)  venlafaxine XR (EFFEXOR-XR) 24 hr capsule 150 mg (150 mg Oral Given 09/25/22 0832)  pantoprazole (PROTONIX) EC tablet 40 mg (40 mg Oral Given 09/25/22 0956)  lamoTRIgine (LAMICTAL) tablet 150 mg (150 mg Oral Given 09/25/22 0956)  pregabalin (LYRICA) capsule 200 mg (200 mg Oral Given 09/25/22 0832)  insulin aspart (novoLOG) injection 0-15 Units (5 Units Subcutaneous Given 09/25/22 1237)  vancomycin (VANCOREADY) IVPB 1750 mg/350 mL (0 mg Intravenous Stopped 09/25/22 0305)  ceFEPIme (MAXIPIME) 2 g in sodium chloride 0.9 % 100 mL IVPB (0 g Intravenous Stopped 09/25/22 0956)  rosuvastatin (CRESTOR) tablet 20 mg (has no administration in time range)  lactated ringers bolus 2,000 mL (0 mLs Intravenous Stopped 09/24/22 1836)  iohexol (OMNIPAQUE) 350 MG/ML injection 100 mL (100 mLs Intravenous Contrast Given 09/24/22 1635)  lidocaine-EPINEPHrine (XYLOCAINE W/EPI) 2 %-1:200000 (PF) injection 20 mL (20 mLs Intradermal Given 09/24/22 1836)  ceFEPIme (MAXIPIME) 2 g in sodium chloride 0.9 % 100 mL IVPB (0 g Intravenous Stopped 09/24/22 1906)  vancomycin (VANCOREADY) IVPB 2000 mg/400 mL (0 mg Intravenous Stopped 09/24/22 2045)   stroke: early stages of recovery book ( Does not apply Given 09/25/22 0957)  gadobutrol (GADAVIST) 1 MMOL/ML  injection 9 mL (9 mLs Intravenous Contrast Given 09/25/22 0233)    Mobility walks     Focused Assessments    R Recommendations:  See Admitting Provider Note  Report given to:   Additional Notes: Patient is A&Ox4 and no complaints. Q[facility] CBG, no pain at this time

## 2022-09-25 NOTE — Assessment & Plan Note (Signed)
Resolved after IVF. Did not need vasopressors. Due to abscess and sepsis.

## 2022-09-25 NOTE — Progress Notes (Signed)
Speech Pathology:  Pt passed [facility] swallow screen; no SLP swallow eval warranted, d/w RN. Will sign off for swallowing.  Hyden Soley L. Samson Frederic, MA CCC/SLP Clinical Specialist - Acute Care SLP Acute Rehabilitation Services Office number [phone]

## 2022-09-25 NOTE — Assessment & Plan Note (Signed)
S/p emergent I&D by EDP. Pt states this is the 5th or 6th time he has had the same area with abscess. Confirmed with his sister [name].  Pt and sister asking for general surgery consult to consider operative management of recurrent abscess. Will continue with IV abx(cefepime/vanco) and wound packing.

## 2022-09-25 NOTE — Assessment & Plan Note (Addendum)
Continue with SSI. Uncontrolled as outpatient with admission A1c of 12.4%

## 2022-09-25 NOTE — Progress Notes (Signed)
Neurology Progress Note  Brief HPI: 57 year old patient with history of diabetes, hypertension, hyperlipidemia, hypothyroidism, hypergonadism status post bilateral orchiectomy, TIA, seizures, traumatic head injury, bipolar disorder, chronic pain syndrome, peripheral neuropathy, PTSD, CAD, COPD, OSA, GERD, bladder cancer and gastroparesis presented with altered mental status, hypotension, left-sided numbness, diaphoresis, confusion, diffuse weakness and chest pressure sensation with dyspnea.  Upon arrival to the ED, he was found to have decreased movement on the left side and left-sided facial droop and aphasia.  He recently had drained and was given a course of antibiotics, and he has been placed on antibiotics for this abscess again during this admission.  He had a recent admission about a month ago with altered mental status and hypotension which was attributed to polypharmacy.  Subjective: Patient states that his weakness has improved, but he is not back to baseline.  He continues to endorse more weakness as well as sensory deficit on the left.  Exam: Vitals:   09/25/22 0500 09/25/22 0600  BP: 126/60 (!) 159/87  Pulse: 79 83  Resp: (!) 31 16  Temp:    SpO2: 100% 100%   Gen: In bed, NAD Resp: non-labored breathing, no acute distress Abd: soft, nt  Neuro: Mental Status: Alert and oriented to person place time and situation, able to follow simple and complex commands Cranial Nerves: Pupils equal round and reactive to light, extraocular movements intact, facial sensation diminished on the left, face symmetrical, hearing intact to voice, phonation normal, shoulder shrug symmetrical, tongue midline Motor: 4+/5 shoulder abduction on the right, 4 -/5 on the left, 4+/5 elbow flexion and extension on the right, 4 -/5 on the left, finger spread stronger on the right the left.  Hip flexion 4 -/5 on the right, 2/5 on the left knee extension and knee flexion 4/5 on the right, 4 -/5 on the left,  dorsiflexion and plantarflexion 4/5 bilaterally Sensory: Intact to light touch throughout but diminished on the left DTR: 2+ right brachioradialis, unable to elicit all others Gait: Deferred  Pertinent Labs:    Latest Ref Rng & Units 09/25/2022    2:02 AM 09/24/2022    4:04 PM 09/24/2022    3:55 PM  CBC  WBC 4.0 - 10.5 K/uL 7.0   7.6   Hemoglobin 13.0 - 17.0 g/dL 47.8  29.5  62.1   Hematocrit 39.0 - 52.0 % 33.8  34.0  37.4   Platelets 150 - 400 K/uL 247   328        Latest Ref Rng & Units 09/25/2022    2:02 AM 09/24/2022    4:04 PM 09/24/2022    3:55 PM  BMP  Glucose 70 - 99 mg/dL 308  657  846   BUN 6 - 20 mg/dL 21  24  20    Creatinine 0.61 - 1.24 mg/dL 9.62  9.52  8.41   Sodium 135 - 145 mmol/L 136  139  139   Potassium 3.5 - 5.1 mmol/L 4.1  4.0  4.0   Chloride 98 - 111 mmol/L 105  108  105   CO2 22 - 32 mmol/L 20   20   Calcium 8.9 - 10.3 mg/dL 8.7   9.3   L2G 40.1 Lipid Panel     Component Value Date/Time   CHOL 275 (H) 09/25/2022 0202   CHOL 337 (H) 12/18/2017 1000   TRIG 294 (H) 09/25/2022 0202   HDL 52 09/25/2022 0202   HDL 23 (L) 12/18/2017 1000   CHOLHDL 5.3 09/25/2022 0202  VLDL 59 (H) 09/25/2022 0202   LDLCALC 164 (H) 09/25/2022 0202   LDLCALC Comment 12/18/2017 1000   LDLDIRECT 120.6 (H) 08/03/2021 0646   LABVLDL Comment 12/18/2017 1000    Imaging Reviewed: CT head: No acute abnormality CTA head and neck: No significant proximal stenosis, aneurysm or branch vessel occlusion, stable atherosclerotic changes within cavernous ICA MRI brain: No acute abnormality MRI C-spine: No evidence of discitis, osteomyelitis or epidural abscess, no spinal canal stenosis or neuroforaminal narrowing  Assessment: 57 year old patient with the above past medical history presents with sudden onset weakness and left-sided numbness, altered mental status, hypotension, chest discomfort and difficulty breathing.  His weakness has improved overnight, but he remains weaker on the left  than on the right and has a left-sided sensory deficit as well.  Imaging shows no acute abnormalities in the head or C-spine.  Initially suspected that patient's deficits were due to hypotension, but he is normotensive at this point and left-sided deficits remain.  MRI negative stroke is most likely explanation for patient's ongoing deficits.  He has multiple stroke risk factors, to include smoking, hypertension and hyperlipidemia, and sudden onset of patient's symptoms fits with this etiology.  Orthostatic hypotension likely plays a role in patient's symptoms, will measure orthostatic vital signs and try nonpharmacological measures to treat this first.  Impression: MRI negative stroke, likely small stroke in right side of brainstem along with orthostatic hypotension  Recommendations: Stroke/TIA Workup  - Admit for stroke workup - TTE w/ bubble - Check A1c and LDL + increase rosuvastatin to 20 mg daily - antiplt/anticoag: Will not order aspirin due to anaphylactic allergy to celecoxib, will defer initiation of Plavix to primary team due to planned medial abscess and possible need for repeat surgical intervention, but patient would benefit from antiplatelet therapy as soon as possible - q4 hr neuro checks - STAT head CT for any change in neuro exam - Tele - PT/OT/SLP - Stroke education - Amb referral to neurology upon discharge    Non-Pharmacologic Recs for Orthostatic Hypotension 1) Lifestyle modification. These measures include:  - Arising slowly, in stages, from supine to seated to standing. This maneuver is most important in the morning, when orthostatic tolerance is lowest.  - Avoiding straining, coughing, and walking in hot weather.These activities reduce venous return and worsen orthostatic hypotension.  - Maintaining hydration and avoiding over-heating.  - Raising the head of the bed 30 to 45 degrees decreases renal perfusion, thereby activating the renin-angiotensin-aldosterone system  and decreasing nocturnal diuresis, which can be pronounced in these patients.  These changes relieve orthostatic hypotension by expanding extracellular fluid volume and may reduce end organ damage by reducing supine HTN.  2) Exercise - walking 30 minutes a day, exercise in a swimming pool, exercise in a recumbent or seated position (using a stationary bike or rowing machine)  3) Abdominal binders   4) Compression Stockings  5) Increased salt and water intake to 2 L to 2.5 L of water a day  6) Modification of meals:  - Avoiding large meals  - Ingesting meals low in carbohydrates  - Alcohol should be avoided during the day as it is a vasodilator  - Drink water with meals  - Avoiding activities or sudden standing immediately after eating  7) Physical Countermaneuvers during daily activities: leg crossing, standing on tip toes, squatting  Cortney E Ernestina Columbia , MSN, AGACNP-BC [facility] See Amion for schedule and pager information 09/25/2022 8:01 AM  NEUROHOSPITALIST ADDENDUM Performed a face to  face diagnostic evaluation.   I have reviewed the contents of history and physical exam as documented by PA/ARNP/Resident and agree with above documentation.  I have discussed and formulated the above plan as documented. Edits to the note have been made as needed.  Impression/Key exam findings/Plan: despite negative MRI Brain, the pattern of weakness is most suggestive of an acute stroke. Does have several risk factors including poorly controlled DM2, HLD, daily smoker, HTN.  Stroke workup as above. TTE pending. Would likely be a good candidate for inpatient rehab.  Erick Blinks, MD [facility] [phone]   If 7pm to 7am, please call on call as listed on AMION.

## 2022-09-25 NOTE — Assessment & Plan Note (Signed)
Likely due to hypotension, sepsis and gluteal abscess.  Now resolved.  MRI brain negative for CVA.

## 2022-09-25 NOTE — TOC CM/SW Note (Signed)
Transition of Care [facility]) - Inpatient Brief Assessment   Patient Details  Name: KINO DUNSWORTH MRN: [id number] Date of Birth: 10/23/1965  [facility] CM/SW Contact:    Mearl Latin, LCSW Phone Number: 09/25/2022, 5:36 PM   Clinical Narrative: Patient admitted from home with several hospital readmissions. Patient most recently active with [facility] home [facility]. Has declined SNF the prior two admissions. Will continue to follow for rehab needs.    Transition of Care Asessment: Insurance and Status: Insurance coverage has been reviewed Patient has primary care physician: Yes Home environment has been reviewed: From home Prior level of function:: Uses walker Prior/Current Home Services: Current home services Social Determinants of Health Reivew: SDOH reviewed no interventions necessary Readmission risk has been reviewed: Yes Transition of care needs: transition of care needs identified, TOC will continue to follow

## 2022-09-25 NOTE — Hospital Course (Signed)
HPI: Vincent [name] is a 57 y.o. male with medical history significant of anxiety, benign essential tremor, bipolar disorder type II, bladder cancer, CAD, chronic pain, COPD, uncontrolled diabetes, diabetic peripheral neuropathy, GERD and gastroparesis presents to the ED with altered mental status.  Patient was last seen normal at 11 AM.  He was visiting his ex-spouse left house 11 AM.  He was then found down with altered mental status at 2.30 PM by his sister.  In the ED he was found to have reduced movement on the left side, left-sided facial droop and aphasia.  Of note patient recently had a pilonidal abscess drained with a course of p.o. antibiotics. Patient was recently admitted to the hospital with AKI and acute encephalopathy from 08/26/2022 to 08/29/2022. Denies chest pain, dyspnea, palpitations, dizziness, cough etc.   Significant Events: Admitted on 09-24-2022  Significant Labs: Admission BUN/Scr 20/1.48  Significant Imaging Studies: MRI brain negative for CVA CTA head/neck negative for LVO CTA chest negative for PE.  Did show Subcutaneous peripherally enhancing gas and fluid collection along the right posterior gluteal cleft measuring 3.5 x 2.3 cm  Antibiotic Therapy: IV cefepime 2 gm q8h started 09-24-2022 @ 1839 IV Vanco started 09-24-2022  Procedures: I&D of right gluteal abscess 09-24-2022  Consultants: neurology

## 2022-09-25 NOTE — Assessment & Plan Note (Signed)
Due to gluteal abscess. Evidence by hypothermia(96.6), hypotension (BP 75/45). Present on admission. Now resolved with I&D of abscess and IV abx.

## 2022-09-25 NOTE — Consult Note (Signed)
Consult Note  Vincent [name] [facility] Aug 21, 1965  [id number].   09/26/2022  Requesting MD: Carollee Herter, DO Chief Complaint/Reason for Consult: Gluteal abscess  HPI:  Patient is a 57 year old year old male who presented to the ED on 7/14 with multiple complaints along with a right buttock abscess. Had I and D in the ER.  Seen by wound care nurse this morning. Patient reports having the abscess for a long time and that it intermittently heals and then drains again  ROS: ROS  Family History  Problem Relation Age of Onset   Diabetes Mother    Diabetes Father    Hypertension Father    Heart attack Father 26       died age 40   Alcohol abuse Father    Colon cancer Neg Hx    Esophageal cancer Neg Hx    Stomach cancer Neg Hx    Rectal cancer Neg Hx     Past Medical History:  Diagnosis Date   Anxiety    Benign essential tremor    Bipolar 2 disorder (HCC)    followed by [facility]--- dr s. Lolly Mustache   Bladder cancer [facility])    recurrent   CAD (coronary artery disease)    cardiac cath 2003  and 2011 both showed normal coronary arteries w/ preserved lvf;  Non obstructive on CTA Oct 2019.    Chronic pain syndrome    back---- [facility] [place]   Cold extremities    BLE   COPD (chronic obstructive pulmonary disease) (HCC)    DDD (degenerative disc disease), lumbar    Diabetic peripheral neuropathy (HCC)    Gastroparesis    followed by dr Marina Goodell   GERD (gastroesophageal reflux disease)    Hiatal hernia    History of bladder cancer urologist-  previously dr Ronal Fear;  now dr gay   papillay TCC (Ta G1)  s/p TURBT and chemo instillation 2014   History of chest pain 12/2017   heart cath normal   History of encephalopathy 05/27/2015   admission w/ acute encephalopathy thought to be secondary to pain meds and COPD   History of gastric ulcer    History of Helicobacter pylori infection    History of kidney stones    History of TIA (transient ischemic attack) 2008  and  10-19-2018    no residual's   History of traumatic head injury 2010   w/ LOC  per pt needed stitches, hit in head with a mower blade   Hyperlipidemia    Hypertension    Hypogonadism male    s/p  bilateral orchiectomy   Hypothyroidism    Insomnia    Mild obstructive sleep apnea    study in epic 12-04-2016, no cpap   PTSD (post-traumatic stress disorder)    chronic   PTSD (post-traumatic stress disorder)    RA (rheumatoid arthritis) (HCC)    followed by [facility]   Seizures, transient [facility]) neurologist-  dr Terrace Arabia--  differential dx complex partial seizure .vs.  mood disorder .vs.  pseudoseizure--  negative EEG's   confusion episodes and staring spells since 11/ 2015   (03-26-2020 per pt wife last seizure 10 /2021)   Suicide attempt by drug overdose (HCC) 07/28/2021   Transient confusion NEUOROLOGIST-  DR Terrace Arabia   Episodes since 11/ 2015--  neurologist dx  differential complex partial seizure  .vs. mood disorder . vs. pseudoseizure   Type 2 diabetes mellitus treated with insulin (HCC)  endocrinologist--- dr Everardo All---  (03-26-2020 pt does not check blood sugar at home)    Past Surgical History:  Procedure Laterality Date   AMPUTATION Left 04/28/2020   Procedure: LEFT LITTLE FINGER AMPUTATION;  Surgeon: Nadara Mustard, MD;  Location[facility];  Service: Orthopedics;  Laterality: Left;   CARDIAC CATHETERIZATION  12-27-2001  DR Jacinto Halim  &  05-26-2009  DR Eldridge Dace   RESULTS FOR BOTH ARE NORMAL CORONARIES AND PERSERVED LVF/ EF 60%   CARPAL TUNNEL RELEASE Bilateral right 09-16-2003;  left ?   CARPAL TUNNEL RELEASE Left 02/25/2015   Procedure: LEFT CARPAL TUNNEL RELEASE;  Surgeon: Betha Loa, MD;  Location: [facility];  Service: Orthopedics;  Laterality: Left;   CYSTOSCOPY N/A 10/10/2012   Procedure: CYSTOSCOPY CLOT EVACUATION FULGERATION OF BLEEDERS ;  Surgeon: Garnett Farm, MD;  Location: [facility];  Service: Urology;  Laterality: N/A;   CYSTOSCOPY  WITH BIOPSY N/A 11/26/2015   Procedure: CYSTOSCOPY WITH BIOPSY AND FULGURATION;  Surgeon: Ihor Gully, MD;  [facility];  Service: Urology;  Laterality: N/A;   ESOPHAGOGASTRODUODENOSCOPY  2014   LAPAROSCOPIC CHOLECYSTECTOMY  11-17-2010   ORCHIECTOMY Right 02/21/2016   Procedure: SCROTAL ORCHIECTOMY with TESTICULAR PROSTHESIS IMPLANT;  Surgeon: Ihor Gully, MD;  Location: [facility];  Service: Urology;  Laterality: Right;   ORCHIECTOMY Left 09/02/2018   Procedure: ORCHIECTOMY;  Surgeon: Ihor Gully, MD;  Location: [facility];  Service: Urology;  Laterality: Left;   ROTATOR CUFF REPAIR Right 12/2004   TRANSURETHRAL RESECTION OF BLADDER TUMOR N/A 08/09/2012   Procedure: TRANSURETHRAL RESECTION OF BLADDER TUMOR (TURBT) WITH GYRUS WITH MITOMYCIN C;  Surgeon: Garnett Farm, MD;  Location: [facility];  Service: Urology;  Laterality: N/A;   TRANSURETHRAL RESECTION OF BLADDER TUMOR N/A 03/29/2020   Procedure: TRANSURETHRAL RESECTION OF BLADDER TUMOR (TURBT) and post-op instillation of gemcitabine;  Surgeon: [name], MD;  Location: [facility];  Service: Urology;  Laterality: N/A;   TRANSURETHRAL RESECTION OF BLADDER TUMOR WITH GYRUS (TURBT-GYRUS) N/A 02/27/2014   Procedure: TRANSURETHRAL RESECTION OF BLADDER TUMOR WITH GYRUS (TURBT-GYRUS);  Surgeon: Garnett Farm, MD;  Location: [facility];  Service: Urology;  Laterality: N/A;    Social History:  reports that he has been smoking cigarettes. He has a 57 pack-year smoking history. He has never used smokeless tobacco. He reports that he does not drink alcohol and does not use drugs.  Allergies:  Allergies  Allergen Reactions   Celecoxib Anaphylaxis, Swelling and Rash    Tongue swells   Hydrocodone Rash and Other (See Comments)    "blisters developed on arms"    Sulfa Antibiotics Rash   Sulfacetamide Sodium Rash    Medications Prior to  Admission  Medication Sig Dispense Refill   acetaminophen (TYLENOL) 325 MG tablet Take 2 tablets (650 mg total) by mouth every 6 (six) hours as needed for mild pain (or Fever >/= 101). 20 tablet 0   baclofen (LIORESAL) 10 MG tablet Take 10 mg by mouth 3 (three) times daily. Sister only gives it to Patient at Bedtime.     clopidogrel (PLAVIX) 75 MG tablet Take 75 mg by mouth daily. Wife called and spoke with nurse.  This is a medication the patient takes at home that was not entered at admission. Dr. Sibyl Parr informed.     DULoxetine (CYMBALTA) 60 MG capsule Take 60 mg by mouth daily. Wife called and spoke with nurse.  This is a medication the patient takes at home that was not entered at admission. Dr. Sibyl Parr informed.     HUMALOG KWIKPEN 200 UNIT/ML KwikPen Inject 20 Units into the skin in the morning, at noon, and at bedtime. (Patient taking differently: Inject 35 Units into the skin in the morning, at noon, and at bedtime. Sister stated Dr. Catalina Pizza her 35 units on both Insulin Pens)     hydroxychloroquine (PLAQUENIL) 200 MG tablet Take 200 mg by mouth 2 (two) times daily. Wife called and spoke with nurse.  This is a medication the patient takes at home that was not entered at admission. Dr. Sibyl Parr informed.     hydrOXYzine (ATARAX) 25 MG tablet Take 25 mg by mouth at bedtime as needed for anxiety or itching.     insulin glargine, 2 Unit Dial, (TOUJEO MAX SOLOSTAR) 300 UNIT/ML Solostar Pen Inject 40 Units into the skin 2 (two) times daily at 8 am and 10 pm. (Patient taking differently: Inject 35 Units into the skin 2 (two) times daily at 8 am and 10 pm. Sister stated Dr. Has Patient using 35 units on both insulin pens.)     lamoTRIgine (LAMICTAL) 150 MG tablet Take 150 mg by mouth 2 (two) times daily. Wife called and spoke with nurse.  This is a medication the patient takes at home that was not entered at admission. Dr. Sibyl Parr informed.     lurasidone (LATUDA) 20 MG TABS tablet Take 40 mg by mouth at bedtime.      omeprazole (PRILOSEC) 40 MG capsule Take 40 mg by mouth daily.     pregabalin (LYRICA) 200 MG capsule Take 200 mg by mouth 3 (three) times daily.     rOPINIRole (REQUIP) 0.5 MG tablet Take 0.5 mg by mouth at bedtime.     rosuvastatin (CRESTOR) 10 MG tablet Take 1 tablet (10 mg total) by mouth daily. (Patient taking differently: Take 10 mg by mouth at bedtime.) 30 tablet 0   tamsulosin (FLOMAX) 0.4 MG CAPS capsule Take 0.4 mg by mouth at bedtime.     venlafaxine XR (EFFEXOR-XR) 150 MG 24 hr capsule Take 150 mg by mouth daily with breakfast. Wife called and spoke with nurse.  This is a medication the patient takes at home that was not entered at admission. Dr. Sibyl Parr informed.     nicotine (NICODERM CQ - DOSED IN MG/24 HOURS) 21 mg/24hr patch Place 1 patch (21 mg total) onto the skin daily. 28 patch 0    Blood pressure 122/61, pulse 80, temperature 97.7 F (36.5 C), temperature source Oral, resp. rate (!) 21, height 6' (1.829 m), weight 89.4 kg, SpO2 99%. Physical Exam:  General: pleasant, WD, male who is laying in bed in NAD HEENT: head is normocephalic, atraumatic.  Sclera are noninjected.  PERRL.  Ears and nose without any masses or lesions.  Mouth is pink and moist Heart: regular, rate, and rhythm.  Normal s1,s2. No obvious murmurs, gallops, or rubs noted.  Palpable radial and pedal pulses bilaterally Lungs: CTAB, no wheezes, rhonchi, or rales noted.  Respiratory effort nonlabored Abd: soft, NT, ND, +BS, no masses, hernias, or organomegaly MS: all 4 extremities are symmetrical with no cyanosis, clubbing, or edema. Skin: 1 cm right buttock abscess near gluteal cleft draining purulence with surrounding erythema and induration Neuro: Cranial nerves 2-12 grossly intact, sensation is normal throughout Psych: A&Ox3 with an appropriate affect.   Results for orders placed or performed during the hospital encounter of 09/24/22 (from the past  48 hour(s))  Protime-INR     Status: None   Collection  Time: 09/24/22  3:55 PM  Result Value Ref Range   Prothrombin Time 12.8 11.4 - 15.2 seconds   INR 0.9 0.8 - 1.2    Comment: (NOTE) INR goal varies based on device and disease states. Performed at [facility], [address] [place], [place] [address]   APTT     Status: None   Collection Time: 09/24/22  3:55 PM  Result Value Ref Range   aPTT 30 24 - 36 seconds    Comment: Performed at [facility], [address] [place], [place] [address]  CBC     Status: Abnormal   Collection Time: 09/24/22  3:55 PM  Result Value Ref Range   WBC 7.6 4.0 - 10.5 K/uL   RBC 4.69 4.22 - 5.81 MIL/uL   Hemoglobin 12.6 (L) 13.0 - 17.0 g/dL   HCT 09.8 (L) 11.9 - 14.7 %   MCV 79.7 (L) 80.0 - 100.0 fL   MCH 26.9 26.0 - 34.0 pg   MCHC 33.7 30.0 - 36.0 g/dL   RDW 82.9 56.2 - 13.0 %   Platelets 328 150 - 400 K/uL   nRBC 0.0 0.0 - 0.2 %    Comment: Performed at [facility], [address], [place], [place] [address]  Differential     Status: Abnormal   Collection Time: 09/24/22  3:55 PM  Result Value Ref Range   Neutrophils Relative % 47 %   Neutro Abs 3.6 1.7 - 7.7 K/uL   Lymphocytes Relative 39 %   Lymphs Abs 3.0 0.7 - 4.0 K/uL   Monocytes Relative 9 %   Monocytes Absolute 0.7 0.1 - 1.0 K/uL   Eosinophils Relative 2 %   Eosinophils Absolute 0.1 0.0 - 0.5 K/uL   Basophils Relative 1 %   Basophils Absolute 0.1 0.0 - 0.1 K/uL   Immature Granulocytes 2 %   Abs Immature Granulocytes 0.12 (H) 0.00 - 0.07 K/uL    Comment: Performed at [facility], [address], [place], [place] [address]  Comprehensive metabolic panel     Status: Abnormal   Collection Time: 09/24/22  3:55 PM  Result Value Ref Range   Sodium 139 135 - 145 mmol/L   Potassium 4.0 3.5 - 5.1 mmol/L   Chloride 105 98 - 111 mmol/L   CO2 20 (L) 22 - 32 mmol/L   Glucose, Bld 127 (H) 70 - 99 mg/dL    Comment: Glucose reference range applies only to samples taken after fasting for at least 8 hours.   BUN 20 6 -  20 mg/dL   Creatinine, Ser 9.52 (H) 0.61 - 1.24 mg/dL   Calcium 9.3 8.9 - 84.1 mg/dL   Total Protein 6.3 (L) 6.5 - 8.1 g/dL   Albumin 3.2 (L) 3.5 - 5.0 g/dL   AST 20 15 - 41 U/L   ALT 16 0 - 44 U/L   Alkaline Phosphatase 72 38 - 126 U/L   Total Bilirubin 0.6 0.3 - 1.2 mg/dL   GFR, Estimated 56 (L) >60 mL/min    Comment: (NOTE) Calculated using the CKD-EPI Creatinine Equation (2021)    Anion gap 14 5 - 15    Comment: Performed at [facility], [address] [place], [place] [address]  Ethanol     Status: None   Collection Time: 09/24/22  3:55 PM  Result Value Ref Range  Alcohol, Ethyl (B) <10 <10 mg/dL    Comment: (NOTE) Lowest detectable limit for serum alcohol is 10 mg/dL.  For medical purposes only. Performed at [facility], [address], [place], [place] [address]   CBG monitoring, ED     Status: Abnormal   Collection Time: 09/24/22  3:57 PM  Result Value Ref Range   Glucose-Capillary 119 (H) 70 - 99 mg/dL    Comment: Glucose reference range applies only to samples taken after fasting for at least 8 hours.   Comment 1 Notify RN    Comment 2 Document in Chart   I-stat chem 8, ED     Status: Abnormal   Collection Time: 09/24/22  4:04 PM  Result Value Ref Range   Sodium 139 135 - 145 mmol/L   Potassium 4.0 3.5 - 5.1 mmol/L   Chloride 108 98 - 111 mmol/L   BUN 24 (H) 6 - 20 mg/dL   Creatinine, Ser 2.13 (H) 0.61 - 1.24 mg/dL   Glucose, Bld 086 (H) 70 - 99 mg/dL    Comment: Glucose reference range applies only to samples taken after fasting for at least 8 hours.   Calcium, Ion 1.15 1.15 - 1.40 mmol/L   TCO2 22 22 - 32 mmol/L   Hemoglobin 11.6 (L) 13.0 - 17.0 g/dL   HCT 57.8 (L) 46.9 - 62.9 %  Urinalysis, Routine w reflex microscopic -Urine, Clean Catch     Status: Abnormal   Collection Time: 09/24/22  5:29 PM  Result Value Ref Range   Color, Urine YELLOW YELLOW   APPearance CLOUDY (A) CLEAR   Specific Gravity, Urine 1.027 1.005 - 1.030   pH 6.0 5.0  - 8.0   Glucose, UA >=500 (A) NEGATIVE mg/dL   Hgb urine dipstick SMALL (A) NEGATIVE   Bilirubin Urine NEGATIVE NEGATIVE   Ketones, ur NEGATIVE NEGATIVE mg/dL   Protein, ur 528 (A) NEGATIVE mg/dL   Nitrite NEGATIVE NEGATIVE   Leukocytes,Ua LARGE (A) NEGATIVE   RBC / HPF 6-10 0 - 5 RBC/hpf   WBC, UA >50 0 - 5 WBC/hpf   Bacteria, UA RARE (A) NONE SEEN   Squamous Epithelial / HPF 0-5 0 - 5 /HPF   WBC Clumps PRESENT    Mucus PRESENT    Hyaline Casts, UA PRESENT     Comment: Performed at [facility], [address], [place], [place] [address]  Rapid urine drug screen (hospital performed)     Status: None   Collection Time: 09/24/22  5:29 PM  Result Value Ref Range   Opiates NONE DETECTED NONE DETECTED   Cocaine NONE DETECTED NONE DETECTED   Benzodiazepines NONE DETECTED NONE DETECTED   Amphetamines NONE DETECTED NONE DETECTED   Tetrahydrocannabinol NONE DETECTED NONE DETECTED   Barbiturates NONE DETECTED NONE DETECTED    Comment: (NOTE) DRUG SCREEN FOR MEDICAL PURPOSES ONLY.  IF CONFIRMATION IS NEEDED FOR ANY PURPOSE, NOTIFY LAB WITHIN 5 DAYS.  LOWEST DETECTABLE LIMITS FOR URINE DRUG SCREEN Drug Class                     Cutoff (ng/mL) Amphetamine and metabolites    1000 Barbiturate and metabolites    200 Benzodiazepine                 200 Opiates and metabolites        300 Cocaine and metabolites        300 THC  50 Performed at [facility], [address], [place], [place] [address]   Lactic acid, plasma     Status: None   Collection Time: 09/24/22  5:42 PM  Result Value Ref Range   Lactic Acid, Venous 1.6 0.5 - 1.9 mmol/L    Comment: Performed at [facility], [address], [place], [place] [address]  Blood culture (routine x 2)     Status: None (Preliminary result)   Collection Time: 09/24/22  6:18 PM   Specimen: BLOOD LEFT HAND  Result Value Ref Range   Specimen Description BLOOD LEFT HAND    Special Requests       BOTTLES DRAWN AEROBIC AND ANAEROBIC Blood Culture results may not be optimal due to an inadequate volume of blood received in culture bottles   Culture      NO GROWTH < 24 HOURS Performed at [facility], [address] [place], [place] [address]    Report Status PENDING   Blood culture (routine x 2)     Status: None (Preliminary result)   Collection Time: 09/24/22  6:34 PM   Specimen: BLOOD LEFT ARM  Result Value Ref Range   Specimen Description BLOOD LEFT ARM    Special Requests      BOTTLES DRAWN AEROBIC AND ANAEROBIC Blood Culture results may not be optimal due to an inadequate volume of blood received in culture bottles   Culture      NO GROWTH < 24 HOURS Performed at [facility], [address] [place], [place] [address]    Report Status PENDING   CBG monitoring, ED     Status: Abnormal   Collection Time: 09/24/22  9:48 PM  Result Value Ref Range   Glucose-Capillary 145 (H) 70 - 99 mg/dL    Comment: Glucose reference range applies only to samples taken after fasting for at least 8 hours.  Troponin I (High Sensitivity)     Status: None   Collection Time: 09/25/22 12:02 AM  Result Value Ref Range   Troponin I (High Sensitivity) 5 <18 ng/L    Comment: (NOTE) Elevated high sensitivity troponin I (hsTnI) values and significant  changes across serial measurements may suggest ACS but many other  chronic and acute conditions are known to elevate hsTnI results.  Refer to the "Links" section for chest pain algorithms and additional  guidance. Performed at [facility], [address], [place], [place] [address]   Hemoglobin A1c     Status: Abnormal   Collection Time: 09/25/22 12:02 AM  Result Value Ref Range   Hgb A1c MFr Bld 12.4 (H) 4.8 - 5.6 %    Comment: (NOTE) Pre diabetes:          5.7%-6.4%  Diabetes:              >6.4%  Glycemic control for   <7.0% adults with diabetes    Mean Plasma Glucose 309.18 mg/dL    Comment: Performed at [facility], [address] [place], [place] [address]  Comprehensive metabolic panel     Status: Abnormal   Collection Time: 09/25/22  2:02 AM  Result Value Ref Range   Sodium 136 135 - 145 mmol/L   Potassium 4.1 3.5 - 5.1 mmol/L   Chloride 105 98 - 111 mmol/L   CO2 20 (L) 22 - 32 mmol/L   Glucose, Bld 152 (H) 70 - 99 mg/dL    Comment: Glucose reference range  applies only to samples taken after fasting for at least 8 hours.   BUN 21 (H) 6 - 20 mg/dL   Creatinine, Ser 5.62 0.61 - 1.24 mg/dL   Calcium 8.7 (L) 8.9 - 10.3 mg/dL   Total Protein 5.0 (L) 6.5 - 8.1 g/dL   Albumin 2.7 (L) 3.5 - 5.0 g/dL   AST 17 15 - 41 U/L   ALT 17 0 - 44 U/L   Alkaline Phosphatase 65 38 - 126 U/L   Total Bilirubin 0.7 0.3 - 1.2 mg/dL   GFR, Estimated >13 >08 mL/min    Comment: (NOTE) Calculated using the CKD-EPI Creatinine Equation (2021)    Anion gap 11 5 - 15    Comment: Performed at [facility], [address], [place], [place] [address]  CBC     Status: Abnormal   Collection Time: 09/25/22  2:02 AM  Result Value Ref Range   WBC 7.0 4.0 - 10.5 K/uL   RBC 4.03 (L) 4.22 - 5.81 MIL/uL   Hemoglobin 11.2 (L) 13.0 - 17.0 g/dL   HCT 69.6 (L) 29.5 - 28.4 %   MCV 83.9 80.0 - 100.0 fL   MCH 27.8 26.0 - 34.0 pg   MCHC 33.1 30.0 - 36.0 g/dL   RDW 13.2 44.0 - 10.2 %   Platelets 247 150 - 400 K/uL   nRBC 0.0 0.0 - 0.2 %    Comment: Performed at [facility], [address] [place], [place] [address]  Lipid panel     Status: Abnormal   Collection Time: 09/25/22  2:02 AM  Result Value Ref Range   Cholesterol 275 (H) 0 - 200 mg/dL   Triglycerides 644 (H) <150 mg/dL   HDL 52 >03 mg/dL   Total CHOL/HDL Ratio 5.3 RATIO   VLDL 59 (H) 0 - 40 mg/dL   LDL Cholesterol 474 (H) 0 - 99 mg/dL    Comment:        Total Cholesterol/HDL:CHD Risk Coronary Heart Disease Risk Table                     Men   Women  1/2 Average Risk   3.4   3.3  Average Risk       5.0   4.4  2 X Average Risk   9.6   7.1  3 X  Average Risk  23.4   11.0        Use the calculated Patient Ratio above and the CHD Risk Table to determine the patient's CHD Risk.        ATP III CLASSIFICATION (LDL):  <100     mg/dL   Optimal  259-563  mg/dL   Near or Above                    Optimal  130-159  mg/dL   Borderline  875-643  mg/dL   High  >329     mg/dL   Very High Performed at [facility], [address] [place], [place] [address]   TSH     Status: None   Collection Time: 09/25/22  2:02 AM  Result Value Ref Range   TSH 2.731 0.350 - 4.500 uIU/mL    Comment: Performed by a 3rd Generation assay with a functional sensitivity of <=0.01 uIU/mL. Performed at [facility], [address] [place], [place] [address]   CBG monitoring, ED     Status: Abnormal  Collection Time: 09/25/22  8:03 AM  Result Value Ref Range   Glucose-Capillary 231 (H) 70 - 99 mg/dL    Comment: Glucose reference range applies only to samples taken after fasting for at least 8 hours.  CBG monitoring, ED     Status: Abnormal   Collection Time: 09/25/22 11:11 AM  Result Value Ref Range   Glucose-Capillary 235 (H) 70 - 99 mg/dL    Comment: Glucose reference range applies only to samples taken after fasting for at least 8 hours.  Glucose, capillary     Status: Abnormal   Collection Time: 09/25/22  4:04 PM  Result Value Ref Range   Glucose-Capillary 227 (H) 70 - 99 mg/dL    Comment: Glucose reference range applies only to samples taken after fasting for at least 8 hours.   MR CERVICAL SPINE W WO CONTRAST  Result Date: 09/25/2022 CLINICAL DATA:  Myelopathy, rule out abscess EXAM: MRI CERVICAL SPINE WITHOUT AND WITH CONTRAST TECHNIQUE: Multiplanar and multiecho pulse sequences of the cervical spine, to include the craniocervical junction and cervicothoracic junction, were obtained without and with intravenous contrast. CONTRAST:  9mL GADAVIST GADOBUTROL 1 MMOL/ML IV SOLN COMPARISON:  05/25/2022 FINDINGS: Evaluation is somewhat limited  by motion artifact. Alignment: No significant listhesis. Vertebrae: No acute fracture, evidence of discitis, or suspicious osseous lesion. No abnormal enhancement. Cord: Normal signal and morphology.  No abnormal enhancement. Posterior Fossa, vertebral arteries, paraspinal tissues: Negative. Disc levels: C2-C3: No significant disc bulge. No spinal canal stenosis or neuroforaminal narrowing. C3-C4: No significant disc bulge. Facet arthropathy, with fused left facets that are better seen on the prior exam. No spinal canal stenosis or neural foraminal narrowing. C4-C5: No significant disc bulge. No spinal canal stenosis or neuroforaminal narrowing. C5-C6: Mild disc bulge, which indents the ventral thecal sac but does not deform the spinal cord. No spinal canal stenosis or neural foraminal narrowing. C6-C7: Minimal disc bulge. Facet and uncovertebral hypertrophy. No spinal canal stenosis. No neural foraminal narrowing. C7-T1: No significant disc bulge. No spinal canal stenosis or neuroforaminal narrowing. IMPRESSION: 1. No evidence of discitis osteomyelitis or epidural abscess. 2. No spinal canal stenosis or neural foraminal narrowing. Electronically Signed   By: Wiliam Ke M.D.   On: 09/25/2022 03:25   DG Chest 2 View  Result Date: 09/25/2022 CLINICAL DATA:  Stroke EXAM: CHEST - 2 VIEW COMPARISON:  Radiograph 08/25/2022 and CT 09/24/2022 FINDINGS: Stable cardiomediastinal silhouette. Aortic atherosclerotic calcification. Low lung volumes accentuated pulmonary vascularity. No focal consolidation, pleural effusion, or pneumothorax. No displaced rib fractures. IMPRESSION: Low lung volumes without acute cardiopulmonary process. Electronically Signed   By: Minerva Fester M.D.   On: 09/25/2022 00:31   MR BRAIN WO CONTRAST  Result Date: 09/24/2022 CLINICAL DATA:  Headache, neuro deficit EXAM: MRI HEAD WITHOUT CONTRAST TECHNIQUE: Multiplanar, multiecho pulse sequences of the brain and surrounding structures were  obtained without intravenous contrast. COMPARISON:  08/27/2022 MRI head, correlation is also made with 09/24/2022 CT head FINDINGS: Evaluation is somewhat limited by motion artifact. Brain: No restricted diffusion to suggest acute or subacute infarct. No acute hemorrhage, mass, mass effect, or midline shift. No hydrocephalus or extra-axial collection. Normal pituitary and craniocervical junction. No hemosiderin deposition to suggest remote hemorrhage. Normal cerebral volume for age. Vascular: Normal arterial flow voids. Skull and upper cervical spine: Normal marrow signal. Sinuses/Orbits: Clear paranasal sinuses. No acute finding in the orbits. Other: The mastoid air cells are well aerated. IMPRESSION: No acute intracranial process. No evidence of acute or subacute infarct.  Electronically Signed   By: Wiliam Ke M.D.   On: 09/24/2022 22:59   CT ANGIO HEAD NECK W WO CM  Result Date: 09/24/2022 CLINICAL DATA:  Neuro deficit, acute, stroke suspected. EXAM: CT ANGIOGRAPHY HEAD AND NECK WITH AND WITHOUT CONTRAST TECHNIQUE: Multidetector CT imaging of the head and neck was performed using the standard protocol during bolus administration of intravenous contrast. Multiplanar CT image reconstructions and MIPs were obtained to evaluate the vascular anatomy. Carotid stenosis measurements (when applicable) are obtained utilizing NASCET criteria, using the distal internal carotid diameter as the denominator. RADIATION DOSE REDUCTION: This exam was performed according to the departmental dose-optimization program which includes automated exposure control, adjustment of the mA and/or kV according to patient size and/or use of iterative reconstruction technique. CONTRAST:  OMNIPAQUE IOHEXOL 350 MG/ML SOLN COMPARISON:  CT angio head and neck 06/10/2020 FINDINGS: CTA NECK FINDINGS Aortic arch: Atherosclerotic calcifications are present at the aortic arch and great vessel origins. No significant stenosis of greater than  50% is present relative to the more distal vessels. Right carotid system: Atherosclerotic changes are noted along the right common carotid artery without significant stenosis. Calcifications are present at the aortic arch and proximal right ICA without significant stenosis. The more distal right ICA is normal. No significant interval change is present. Left carotid system: The left common carotid artery demonstrates atherosclerotic changes without significant stenosis. Calcifications are present at the aortic arch and proximal left ICA without a significant stenosis relative to the more distal vessel. Vertebral arteries: The right vertebral artery is the dominant vessel. Atherosclerotic calcifications are present in the V1 segment. No significant stenosis is present in either vertebral artery in the neck. Skeleton: No acute abnormalities are present. No focal osseous lesions are present. The patient is edentulous. Other neck: Soft tissues the neck are otherwise unremarkable. Salivary glands are within normal limits. Thyroid is normal. No significant adenopathy is present. No focal mucosal or submucosal lesions are present. Upper chest: The lung apices are clear. The thoracic inlet is within normal limits. Review of the MIP images confirms the above findings CTA HEAD FINDINGS Anterior circulation: Atherosclerotic calcifications are present within the cavernous internal carotid arteries bilaterally without a significant stenosis relative to the more distal vessels. The A1 and M1 segments are normal. The anterior communicating artery is patent. MCA bifurcations are within normal limits. ACA and MCA branch vessels are normal. No aneurysm is present. Posterior circulation: The right vertebral artery is the dominant vessel. PICA origin is visualized and normal. Scratched at the PICA origins are visualized and normal. The vertebrobasilar junction and basilar artery normal. Both posterior cerebral arteries scratched at the  superior cerebellar arteries are patent. Both posterior cerebral arteries originate from the basilar tip. The PCA branch vessels are normal bilaterally. Venous sinuses: The dural sinuses are patent. The straight sinus and deep cerebral veins are intact. Cortical veins are within normal limits. No significant vascular malformation is evident. Review of the MIP images confirms the above findings IMPRESSION: 1. No significant proximal stenosis, aneurysm, or branch vessel occlusion within the Circle of Willis. 2. Stable atherosclerotic calcifications within the cavernous internal carotid arteries without significant stenosis. 3. Stable atherosclerotic changes within the cavernous internal carotid arteries bilaterally without significant stenosis relative to the more distal vessels. 4.  Aortic Atherosclerosis (ICD10-I70.0). Electronically Signed   By: Marin Roberts M.D.   On: 09/24/2022 17:35   CT Angio Chest Aorta W and/or Wo Contrast  Result Date: 09/24/2022 CLINICAL DATA:  Neuro  deficit, acute aortic syndrome suspected EXAM: CT ANGIOGRAPHY CHEST, ABDOMEN AND PELVIS TECHNIQUE: Non-contrast CT of the chest was initially obtained. Multidetector CT imaging through the chest, abdomen and pelvis was performed using the standard protocol during bolus administration of intravenous contrast. Multiplanar reconstructed images and MIPs were obtained and reviewed to evaluate the vascular anatomy. RADIATION DOSE REDUCTION: This exam was performed according to the departmental dose-optimization program which includes automated exposure control, adjustment of the mA and/or kV according to patient size and/or use of iterative reconstruction technique. CONTRAST:  OMNIPAQUE IOHEXOL 350 MG/ML SOLN COMPARISON:  Chest radiograph 08/25/2022; CT abdomen and pelvis 06/21/2022; CTA chest abdomen and pelvis 06/10/2020 FINDINGS: CTA CHEST FINDINGS Cardiovascular: Normal heart size. No pericardial effusion. No acute aortic  syndrome. Aortic atherosclerotic and coronary artery atherosclerotic calcification. Mediastinum/Nodes: Unremarkable esophagus. Secretions in the posterior trachea near the thoracic inlet. No thoracic adenopathy. Lungs/Pleura: Lungs are clear. No pleural effusion or pneumothorax. Mild diffuse bronchial wall thickening. Mild mosaic attenuation of the lungs compatible with air trapping. Musculoskeletal: No chest wall abnormality. No acute or significant osseous findings. Review of the MIP images confirms the above findings. CTA ABDOMEN AND PELVIS FINDINGS VASCULAR Aorta: Moderate calcified plaque causes up to mild narrowing near the bifurcation. No aneurysm or dissection. Celiac: Patent without aneurysm or dissection. SMA: Patent without aneurysm or dissection. Renals: Calcified plaque at the origin of the right renal artery causes moderate narrowing. Patent left renal artery. No aneurysm or dissection IMA: Patent. Inflow: Scattered calcified atherosclerotic plaque causing multifocal areas of mild narrowing. No aneurysm or dissection. Veins: Patent portal vein. Review of the MIP images confirms the above findings. NON-VASCULAR Hepatobiliary: Cholecystectomy. Hepatic steatosis. No biliary dilation. Pancreas: Unremarkable. No pancreatic ductal dilatation or surrounding inflammatory changes. Spleen: Normal in size without focal abnormality. Adrenals/Urinary Tract: Unremarkable adrenal glands and kidneys. No hydronephrosis or urinary calculi. Unremarkable bladder. Stomach/Bowel: Stomach is within normal limits. Normal caliber large and small bowel. Moderate colonic stool load. Normal appendix. Lymphatic: No lymphadenopathy. Reproductive: No acute abnormality. Other: Subcutaneous peripherally enhancing gas and fluid collection along the right posterior gluteal cleft measuring 3.5 x 2.3 cm (6/344). Mild adjacent stranding. No free intraperitoneal fluid or air. Musculoskeletal: No acute osseous abnormality. Review of the MIP  images confirms the above findings. IMPRESSION: 1. No acute aortic syndrome. 2. Subcutaneous peripherally enhancing gas and fluid collection along the right posterior gluteal cleft measuring 3.5 x 2.3 cm, concerning for abscess. 3. Diffuse bronchial wall thickening. Layering secretions in the trachea near the thoracic inlet. Query aspiration. 4. Hepatic steatosis. 5. Moderate stool load.  Correlate for constipation. Aortic Atherosclerosis (ICD10-I70.0). Electronically Signed   By: Minerva Fester M.D.   On: 09/24/2022 17:27   CT HEAD CODE STROKE WO CONTRAST  Result Date: 09/24/2022 CLINICAL DATA:  Code stroke. Neuro deficit, acute, stroke suspected. EXAM: CT HEAD WITHOUT CONTRAST TECHNIQUE: Contiguous axial images were obtained from the base of the skull through the vertex without intravenous contrast. RADIATION DOSE REDUCTION: This exam was performed according to the departmental dose-optimization program which includes automated exposure control, adjustment of the mA and/or kV according to patient size and/or use of iterative reconstruction technique. COMPARISON:  CT head without contrast 07/18/2022. MR head without contrast 08/27/2022 FINDINGS: Brain: No acute infarct, hemorrhage, or mass lesion is present. No significant white matter lesions are present. Deep brain nuclei are within normal limits. Insular ribbon is normal bilaterally. The ventricles are of normal size. No significant extraaxial fluid collection is present. The brainstem and cerebellum  are within normal limits. Midline structures are within normal limits. Vascular: Atherosclerotic calcifications are present within the cavernous internal carotid arteries bilaterally. No hyperdense vessel is present. Skull: Calvarium is intact. No focal lytic or blastic lesions are present. No significant extracranial soft tissue lesion is present. Sinuses/Orbits: The paranasal sinuses and mastoid air cells are clear. The globes and orbits are within normal  limits. ASPECTS [facility] Stroke Program Early CT Score) - Ganglionic level infarction (caudate, lentiform nuclei, internal capsule, insula, M1-M3 cortex): 7/7 - Supraganglionic infarction (M4-M6 cortex): 3/3 Total score (0-10 with 10 being normal): 10/10 IMPRESSION: 1. Negative CT of the head. 2. Aspects is 10/10. The above was relayed via text pager to Dr. Otelia Limes on 09/24/2022 at 16:15 . Electronically Signed   By: Marin Roberts M.D.   On: 09/24/2022 16:17      Assessment/Plan Right gluteal abscess - possible pilonidal abscess - CT 7/14with gas and fluid collection 3.5 x 2.3 cm, concerning for abscess. - s/p I&D by EDP 7/14 - no leukocytosis, afebrile and HD stable   I discussed the diagnosis with the patient.  This may be a pilonidal abscess or a sebaceous.  Will need further debridement in the OR this week.  I will make NPO and hold his plavix and discuss with our emergent general surgeon of the week, Dr. Carolynne Edouard regarding plans  FEN VTE ID  - per primary -  CAD COPD Hx of bladder cancer Bipolar II disorder/PTSD Hx of TBI Hx of TIA HTN HLD Seizure disorder T2DM Peripheral vascular disease Chronic pain syndrome  I reviewed hospitalist notes, last 24 h vitals and pain scores, last 48 h intake and output, last 24 h labs and trends, and last 24 h imaging results.   Carman Ching, MD [facility] Surgery  Please see Amion for pager number during day hours 7:00am-4:30pm

## 2022-09-25 NOTE — Progress Notes (Addendum)
PROGRESS NOTE    Vincent [name]  MVH:[id number] DOB: October 13, 1965 DOA: 09/24/2022 PCP: Vincent Davenport, MD  Subjective: Admitted last night due to AMS. Was hypotension. Discovered to have gluteal abscess. Pt's sister says that he has had abscess in same location on his buttocks about 5 or 6 times. Sister is requesting surgical consult.    Hospital Course: HPI: Vincent [name] is a 57 y.o. male with medical history significant of anxiety, benign essential tremor, bipolar disorder type II, bladder cancer, CAD, chronic pain, COPD, uncontrolled diabetes, diabetic peripheral neuropathy, GERD and gastroparesis presents to the ED with altered mental status.  Patient was last seen normal at 11 AM.  He was visiting his ex-spouse left house 11 AM.  He was then found down with altered mental status at 2.30 PM by his sister.  In the ED he was found to have reduced movement on the left side, left-sided facial droop and aphasia.  Of note patient recently had a pilonidal abscess drained with a course of p.o. antibiotics. Patient was recently admitted to the hospital with AKI and acute encephalopathy from 08/26/2022 to 08/29/2022. Denies chest pain, dyspnea, palpitations, dizziness, cough etc.   Significant Events: Admitted on 09-24-2022  Significant Labs: Admission BUN/Scr 20/1.48  Significant Imaging Studies: MRI brain negative for CVA CTA head/neck negative for LVO CTA chest negative for PE.  Did show Subcutaneous peripherally enhancing gas and fluid collection along the right posterior gluteal cleft measuring 3.5 x 2.3 cm  Antibiotic Therapy: IV cefepime 2 gm q8h started 09-24-2022 @ 1839 IV Vanco started 09-24-2022  Procedures: I&D of right gluteal abscess 09-24-2022  Consultants: neurology     Assessment and Plan: * Acute encephalopathy Likely due to hypotension, sepsis and gluteal abscess.  Now resolved.  MRI brain negative for CVA.  Abscess, gluteal, right S/p emergent I&D by EDP. Pt  states this is the 5th or 6th time he has had the same area with abscess. Confirmed with his sister Vincent [name].  Pt and sister asking for general surgery consult to consider operative management of recurrent abscess. Will continue with IV abx(cefepime/vanco) and wound packing.  Sepsis with acute renal failure (HCC) Due to gluteal abscess. Evidence by hypothermia(96.6), hypotension (BP 75/45). Present on admission. Now resolved with I&D of abscess and IV abx.  AKI (acute kidney injury) (HCC) Due to hypotension. Improved with IVF.  Hypotension Resolved after IVF. Did not need vasopressors. Due to abscess and sepsis.  Essential hypertension Sister states pt's BP has been running low at home and he has stopped taking all his HTN meds.  DM type 2 with diabetic peripheral neuropathy (HCC) Continue with SSI. Uncontrolled as outpatient with admission A1c of 12.4%   DVT prophylaxis: SQ Heparin   Code Status: Full Code Family Communication: spoke with pt and sister Vincent [name] at bedside Disposition Plan: return home Reason for continuing need for hospitalization: continued IV abx  Objective: Vitals:   09/25/22 0845 09/25/22 0900 09/25/22 1202 09/25/22 1204  BP: 129/69 131/64 122/61   Pulse: 85 86 80   Resp: 16 16 (!) 21   Temp:    97.7 F (36.5 C)  TempSrc:    Oral  SpO2: 100% 99% 99%   Weight:      Height:        Intake/Output Summary (Last 24 hours) at 09/25/2022 1541 Last data filed at 09/25/2022 0956 Gross per 24 hour  Intake 500 ml  Output --  Net 500 ml   [employer]  09/24/22 1600  Weight: 89.4 kg    Examination:  Physical Exam Vitals and nursing note reviewed.  Constitutional:      Appearance: He is not toxic-appearing.  HENT:     Head: Normocephalic and atraumatic.     Nose: Nose normal.  Cardiovascular:     Rate and Rhythm: Normal rate and regular rhythm.  Pulmonary:     Effort: Pulmonary effort is normal.     Breath sounds: Normal breath sounds.  Abdominal:      General: Bowel sounds are normal. There is no distension.  Musculoskeletal:     Right lower leg: No edema.     Left lower leg: No edema.  Skin:    General: Skin is warm and dry.     Capillary Refill: Capillary refill takes less than 2 seconds.     Comments: Right gluteal cleft with continuous purulent drainage from prior I&D.  Neurological:     General: No focal deficit present.     Mental Status: He is alert and oriented to person, place, and time.     Data Reviewed: I have personally reviewed following labs and imaging studies  CBC: Recent Labs  Lab 09/24/22 1555 09/24/22 1604 09/25/22 0202  WBC 7.6  --  7.0  NEUTROABS 3.6  --   --   HGB 12.6* 11.6* 11.2*  HCT 37.4* 34.0* 33.8*  MCV 79.7*  --  83.9  PLT 328  --  247   Basic Metabolic Panel: Recent Labs  Lab 09/24/22 1555 09/24/22 1604 09/25/22 0202  NA 139 139 136  K 4.0 4.0 4.1  CL 105 108 105  CO2 20*  --  20*  GLUCOSE 127* 126* 152*  BUN 20 24* 21*  CREATININE 1.48* 1.50* 1.18  CALCIUM 9.3  --  8.7*   GFR: Estimated Creatinine Clearance: 77.6 mL/min (by C-G formula based on SCr of 1.18 mg/dL). Liver Function Tests: Recent Labs  Lab 09/24/22 1555 09/25/22 0202  AST 20 17  ALT 16 17  ALKPHOS 72 65  BILITOT 0.6 0.7  PROT 6.3* 5.0*  ALBUMIN 3.2* 2.7*   Coagulation Profile: Recent Labs  Lab 09/24/22 1555  INR 0.9   HbA1C: Recent Labs    09/25/22 0002  HGBA1C 12.4*   CBG: Recent Labs  Lab 09/24/22 1557 09/24/22 2148 09/25/22 0803 09/25/22 1111  GLUCAP 119* 145* 231* 235*   Lipid Profile: Recent Labs    09/25/22 0202  CHOL 275*  HDL 52  LDLCALC 164*  TRIG 294*  CHOLHDL 5.3   Thyroid Function Tests: Recent Labs    09/25/22 0202  TSH 2.731   Sepsis Labs: Recent Labs  Lab 09/24/22 1742  LATICACIDVEN 1.6    Recent Results (from the past 240 hour(s))  Blood culture (routine x 2)     Status: None (Preliminary result)   Collection Time: 09/24/22  6:18 PM   Specimen:  BLOOD LEFT HAND  Result Value Ref Range Status   Specimen Description BLOOD LEFT HAND  Final   Special Requests   Final    BOTTLES DRAWN AEROBIC AND ANAEROBIC Blood Culture results may not be optimal due to an inadequate volume of blood received in culture bottles   Culture   Final    NO GROWTH < 24 HOURS Performed at [facility], [address] [place], [place] [address]    Report Status PENDING  Incomplete  Blood culture (routine x 2)     Status: None (Preliminary result)  Collection Time: 09/24/22  6:34 PM   Specimen: BLOOD LEFT ARM  Result Value Ref Range Status   Specimen Description BLOOD LEFT ARM  Final   Special Requests   Final    BOTTLES DRAWN AEROBIC AND ANAEROBIC Blood Culture results may not be optimal due to an inadequate volume of blood received in culture bottles   Culture   Final    NO GROWTH < 24 HOURS Performed at [facility], [address] [place], [place] [address]    Report Status PENDING  Incomplete     Radiology Studies: MR CERVICAL SPINE W WO CONTRAST  Result Date: 09/25/2022 CLINICAL DATA:  Myelopathy, rule out abscess EXAM: MRI CERVICAL SPINE WITHOUT AND WITH CONTRAST TECHNIQUE: Multiplanar and multiecho pulse sequences of the cervical spine, to include the craniocervical junction and cervicothoracic junction, were obtained without and with intravenous contrast. CONTRAST:  9mL GADAVIST GADOBUTROL 1 MMOL/ML IV SOLN COMPARISON:  05/25/2022 FINDINGS: Evaluation is somewhat limited by motion artifact. Alignment: No significant listhesis. Vertebrae: No acute fracture, evidence of discitis, or suspicious osseous lesion. No abnormal enhancement. Cord: Normal signal and morphology.  No abnormal enhancement. Posterior Fossa, vertebral arteries, paraspinal tissues: Negative. Disc levels: C2-C3: No significant disc bulge. No spinal canal stenosis or neuroforaminal narrowing. C3-C4: No significant disc bulge. Facet arthropathy, with fused left facets that  are better seen on the prior exam. No spinal canal stenosis or neural foraminal narrowing. C4-C5: No significant disc bulge. No spinal canal stenosis or neuroforaminal narrowing. C5-C6: Mild disc bulge, which indents the ventral thecal sac but does not deform the spinal cord. No spinal canal stenosis or neural foraminal narrowing. C6-C7: Minimal disc bulge. Facet and uncovertebral hypertrophy. No spinal canal stenosis. No neural foraminal narrowing. C7-T1: No significant disc bulge. No spinal canal stenosis or neuroforaminal narrowing. IMPRESSION: 1. No evidence of discitis osteomyelitis or epidural abscess. 2. No spinal canal stenosis or neural foraminal narrowing. Electronically Signed   By: Wiliam Ke M.D.   On: 09/25/2022 03:25   DG Chest 2 View  Result Date: 09/25/2022 CLINICAL DATA:  Stroke EXAM: CHEST - 2 VIEW COMPARISON:  Radiograph 08/25/2022 and CT 09/24/2022 FINDINGS: Stable cardiomediastinal silhouette. Aortic atherosclerotic calcification. Low lung volumes accentuated pulmonary vascularity. No focal consolidation, pleural effusion, or pneumothorax. No displaced rib fractures. IMPRESSION: Low lung volumes without acute cardiopulmonary process. Electronically Signed   By: Minerva Fester M.D.   On: 09/25/2022 00:31   MR BRAIN WO CONTRAST  Result Date: 09/24/2022 CLINICAL DATA:  Headache, neuro deficit EXAM: MRI HEAD WITHOUT CONTRAST TECHNIQUE: Multiplanar, multiecho pulse sequences of the brain and surrounding structures were obtained without intravenous contrast. COMPARISON:  08/27/2022 MRI head, correlation is also made with 09/24/2022 CT head FINDINGS: Evaluation is somewhat limited by motion artifact. Brain: No restricted diffusion to suggest acute or subacute infarct. No acute hemorrhage, mass, mass effect, or midline shift. No hydrocephalus or extra-axial collection. Normal pituitary and craniocervical junction. No hemosiderin deposition to suggest remote hemorrhage. Normal cerebral  volume for age. Vascular: Normal arterial flow voids. Skull and upper cervical spine: Normal marrow signal. Sinuses/Orbits: Clear paranasal sinuses. No acute finding in the orbits. Other: The mastoid air cells are well aerated. IMPRESSION: No acute intracranial process. No evidence of acute or subacute infarct. Electronically Signed   By: Wiliam Ke M.D.   On: 09/24/2022 22:59   CT ANGIO HEAD NECK W WO CM  Result Date: 09/24/2022 CLINICAL DATA:  Neuro deficit, acute, stroke suspected. EXAM: CT ANGIOGRAPHY HEAD AND  NECK WITH AND WITHOUT CONTRAST TECHNIQUE: Multidetector CT imaging of the head and neck was performed using the standard protocol during bolus administration of intravenous contrast. Multiplanar CT image reconstructions and MIPs were obtained to evaluate the vascular anatomy. Carotid stenosis measurements (when applicable) are obtained utilizing NASCET criteria, using the distal internal carotid diameter as the denominator. RADIATION DOSE REDUCTION: This exam was performed according to the departmental dose-optimization program which includes automated exposure control, adjustment of the mA and/or kV according to patient size and/or use of iterative reconstruction technique. CONTRAST:  OMNIPAQUE IOHEXOL 350 MG/ML SOLN COMPARISON:  CT angio head and neck 06/10/2020 FINDINGS: CTA NECK FINDINGS Aortic arch: Atherosclerotic calcifications are present at the aortic arch and great vessel origins. No significant stenosis of greater than 50% is present relative to the more distal vessels. Right carotid system: Atherosclerotic changes are noted along the right common carotid artery without significant stenosis. Calcifications are present at the aortic arch and proximal right ICA without significant stenosis. The more distal right ICA is normal. No significant interval change is present. Left carotid system: The left common carotid artery demonstrates atherosclerotic changes without significant  stenosis. Calcifications are present at the aortic arch and proximal left ICA without a significant stenosis relative to the more distal vessel. Vertebral arteries: The right vertebral artery is the dominant vessel. Atherosclerotic calcifications are present in the V1 segment. No significant stenosis is present in either vertebral artery in the neck. Skeleton: No acute abnormalities are present. No focal osseous lesions are present. The patient is edentulous. Other neck: Soft tissues the neck are otherwise unremarkable. Salivary glands are within normal limits. Thyroid is normal. No significant adenopathy is present. No focal mucosal or submucosal lesions are present. Upper chest: The lung apices are clear. The thoracic inlet is within normal limits. Review of the MIP images confirms the above findings CTA HEAD FINDINGS Anterior circulation: Atherosclerotic calcifications are present within the cavernous internal carotid arteries bilaterally without a significant stenosis relative to the more distal vessels. The A1 and M1 segments are normal. The anterior communicating artery is patent. MCA bifurcations are within normal limits. ACA and MCA branch vessels are normal. No aneurysm is present. Posterior circulation: The right vertebral artery is the dominant vessel. PICA origin is visualized and normal. Scratched at the PICA origins are visualized and normal. The vertebrobasilar junction and basilar artery normal. Both posterior cerebral arteries scratched at the superior cerebellar arteries are patent. Both posterior cerebral arteries originate from the basilar tip. The PCA branch vessels are normal bilaterally. Venous sinuses: The dural sinuses are patent. The straight sinus and deep cerebral veins are intact. Cortical veins are within normal limits. No significant vascular malformation is evident. Review of the MIP images confirms the above findings IMPRESSION: 1. No significant proximal stenosis, aneurysm, or  branch vessel occlusion within the Circle of Willis. 2. Stable atherosclerotic calcifications within the cavernous internal carotid arteries without significant stenosis. 3. Stable atherosclerotic changes within the cavernous internal carotid arteries bilaterally without significant stenosis relative to the more distal vessels. 4.  Aortic Atherosclerosis (ICD10-I70.0). Electronically Signed   By: Marin Roberts M.D.   On: 09/24/2022 17:35   CT Angio Chest Aorta W and/or Wo Contrast  Result Date: 09/24/2022 CLINICAL DATA:  Neuro deficit, acute aortic syndrome suspected EXAM: CT ANGIOGRAPHY CHEST, ABDOMEN AND PELVIS TECHNIQUE: Non-contrast CT of the chest was initially obtained. Multidetector CT imaging through the chest, abdomen and pelvis was performed using the standard protocol during bolus administration of  intravenous contrast. Multiplanar reconstructed images and MIPs were obtained and reviewed to evaluate the vascular anatomy. RADIATION DOSE REDUCTION: This exam was performed according to the departmental dose-optimization program which includes automated exposure control, adjustment of the mA and/or kV according to patient size and/or use of iterative reconstruction technique. CONTRAST:  OMNIPAQUE IOHEXOL 350 MG/ML SOLN COMPARISON:  Chest radiograph 08/25/2022; CT abdomen and pelvis 06/21/2022; CTA chest abdomen and pelvis 06/10/2020 FINDINGS: CTA CHEST FINDINGS Cardiovascular: Normal heart size. No pericardial effusion. No acute aortic syndrome. Aortic atherosclerotic and coronary artery atherosclerotic calcification. Mediastinum/Nodes: Unremarkable esophagus. Secretions in the posterior trachea near the thoracic inlet. No thoracic adenopathy. Lungs/Pleura: Lungs are clear. No pleural effusion or pneumothorax. Mild diffuse bronchial wall thickening. Mild mosaic attenuation of the lungs compatible with air trapping. Musculoskeletal: No chest wall abnormality. No acute or significant osseous  findings. Review of the MIP images confirms the above findings. CTA ABDOMEN AND PELVIS FINDINGS VASCULAR Aorta: Moderate calcified plaque causes up to mild narrowing near the bifurcation. No aneurysm or dissection. Celiac: Patent without aneurysm or dissection. SMA: Patent without aneurysm or dissection. Renals: Calcified plaque at the origin of the right renal artery causes moderate narrowing. Patent left renal artery. No aneurysm or dissection IMA: Patent. Inflow: Scattered calcified atherosclerotic plaque causing multifocal areas of mild narrowing. No aneurysm or dissection. Veins: Patent portal vein. Review of the MIP images confirms the above findings. NON-VASCULAR Hepatobiliary: Cholecystectomy. Hepatic steatosis. No biliary dilation. Pancreas: Unremarkable. No pancreatic ductal dilatation or surrounding inflammatory changes. Spleen: Normal in size without focal abnormality. Adrenals/Urinary Tract: Unremarkable adrenal glands and kidneys. No hydronephrosis or urinary calculi. Unremarkable bladder. Stomach/Bowel: Stomach is within normal limits. Normal caliber large and small bowel. Moderate colonic stool load. Normal appendix. Lymphatic: No lymphadenopathy. Reproductive: No acute abnormality. Other: Subcutaneous peripherally enhancing gas and fluid collection along the right posterior gluteal cleft measuring 3.5 x 2.3 cm (6/344). Mild adjacent stranding. No free intraperitoneal fluid or air. Musculoskeletal: No acute osseous abnormality. Review of the MIP images confirms the above findings. IMPRESSION: 1. No acute aortic syndrome. 2. Subcutaneous peripherally enhancing gas and fluid collection along the right posterior gluteal cleft measuring 3.5 x 2.3 cm, concerning for abscess. 3. Diffuse bronchial wall thickening. Layering secretions in the trachea near the thoracic inlet. Query aspiration. 4. Hepatic steatosis. 5. Moderate stool load.  Correlate for constipation. Aortic Atherosclerosis (ICD10-I70.0).  Electronically Signed   By: Minerva Fester M.D.   On: 09/24/2022 17:27   CT HEAD CODE STROKE WO CONTRAST  Result Date: 09/24/2022 CLINICAL DATA:  Code stroke. Neuro deficit, acute, stroke suspected. EXAM: CT HEAD WITHOUT CONTRAST TECHNIQUE: Contiguous axial images were obtained from the base of the skull through the vertex without intravenous contrast. RADIATION DOSE REDUCTION: This exam was performed according to the departmental dose-optimization program which includes automated exposure control, adjustment of the mA and/or kV according to patient size and/or use of iterative reconstruction technique. COMPARISON:  CT head without contrast 07/18/2022. MR head without contrast 08/27/2022 FINDINGS: Brain: No acute infarct, hemorrhage, or mass lesion is present. No significant white matter lesions are present. Deep brain nuclei are within normal limits. Insular ribbon is normal bilaterally. The ventricles are of normal size. No significant extraaxial fluid collection is present. The brainstem and cerebellum are within normal limits. Midline structures are within normal limits. Vascular: Atherosclerotic calcifications are present within the cavernous internal carotid arteries bilaterally. No hyperdense vessel is present. Skull: Calvarium is intact. No focal lytic or blastic lesions are present. No  significant extracranial soft tissue lesion is present. Sinuses/Orbits: The paranasal sinuses and mastoid air cells are clear. The globes and orbits are within normal limits. ASPECTS [facility] Stroke Program Early CT Score) - Ganglionic level infarction (caudate, lentiform nuclei, internal capsule, insula, M1-M3 cortex): 7/7 - Supraganglionic infarction (M4-M6 cortex): 3/3 Total score (0-10 with 10 being normal): 10/10 IMPRESSION: 1. Negative CT of the head. 2. Aspects is 10/10. The above was relayed via text pager to Dr. Otelia Limes on 09/24/2022 at 16:15 . Electronically Signed   By: Marin Roberts M.D.   On:  09/24/2022 16:17     Scheduled Meds:  DULoxetine  60 mg Oral Daily   heparin injection (subcutaneous)  5,000 Units Subcutaneous Q8H   hydroxychloroquine  200 mg Oral BID   insulin aspart  0-15 Units Subcutaneous TID WC   lamoTRIgine  150 mg Oral BID   lurasidone  40 mg Oral QHS   pantoprazole  40 mg Oral Daily   pregabalin  200 mg Oral TID   rosuvastatin  20 mg Oral QHS   sodium chloride flush  3 mL Intravenous Once   venlafaxine XR  150 mg Oral Q breakfast   Continuous Infusions:  ceFEPime (MAXIPIME) IV 2 g (09/25/22 1455)   vancomycin Stopped (09/25/22 0305)     LOS: 1 day    Time spent: 40 minutes    Carollee Herter, DO  [facility]  09/25/2022, 3:41 PM

## 2022-09-25 NOTE — H&P (View-Only) (Signed)
Consult Note  [facility] [birth year]  [id number].   09/26/2022  Requesting MD: Carollee Herter, DO Chief Complaint/Reason for Consult: Gluteal abscess  HPI:  Patient is a 57 year old year old male who presented to the ED on 7/14 with multiple complaints along with a right buttock abscess. Had I and D in the ER.  Seen by wound care nurse this morning. Patient reports having the abscess for a long time and that it intermittently heals and then drains again  ROS: ROS  Family History  Problem Relation Age of Onset   Diabetes Mother    Diabetes Father    Hypertension Father    Heart attack Father 74       died age 82   Alcohol abuse Father    Colon cancer Neg Hx    Esophageal cancer Neg Hx    Stomach cancer Neg Hx    Rectal cancer Neg Hx     Past Medical History:  Diagnosis Date   Anxiety    Benign essential tremor    Bipolar 2 disorder (HCC)    followed by [facility]--- dr s. Lolly Mustache   Bladder cancer [facility])    recurrent   CAD (coronary artery disease)    cardiac cath 2003  and 2011 both showed normal coronary arteries w/ preserved lvf;  Non obstructive on CTA Oct 2019.    Chronic pain syndrome    back---- [facility] [place]   Cold extremities    BLE   COPD (chronic obstructive pulmonary disease) (HCC)    DDD (degenerative disc disease), lumbar    Diabetic peripheral neuropathy (HCC)    Gastroparesis    followed by dr Marina Goodell   GERD (gastroesophageal reflux disease)    Hiatal hernia    History of bladder cancer urologist-  previously dr Ronal Fear;  now dr gay   papillay TCC (Ta G1)  s/p TURBT and chemo instillation 2014   History of chest pain 12/2017   heart cath normal   History of encephalopathy 05/27/2015   admission w/ acute encephalopathy thought to be secondary to pain meds and COPD   History of gastric ulcer    History of Helicobacter pylori infection    History of kidney stones    History of TIA (transient ischemic attack) 2008  and  10-19-2018    no residual's   History of traumatic head injury 2010   w/ LOC  per pt needed stitches, hit in head with a mower blade   Hyperlipidemia    Hypertension    Hypogonadism male    s/p  bilateral orchiectomy   Hypothyroidism    Insomnia    Mild obstructive sleep apnea    study in epic 12-04-2016, no cpap   PTSD (post-traumatic stress disorder)    chronic   PTSD (post-traumatic stress disorder)    RA (rheumatoid arthritis) (HCC)    followed by [facility]   Seizures, transient [facility]) neurologist-  dr Terrace Arabia--  differential dx complex partial seizure .vs.  mood disorder .vs.  pseudoseizure--  negative EEG's   confusion episodes and staring spells since 11/ 2015   (03-26-2020 per pt wife last seizure 10 /2021)   Suicide attempt by drug overdose (HCC) 07/28/2021   Transient confusion NEUOROLOGIST-  DR Terrace Arabia   Episodes since 11/ 2015--  neurologist dx  differential complex partial seizure  .vs. mood disorder . vs. pseudoseizure   Type 2 diabetes mellitus treated with insulin (HCC)  endocrinologist--- dr Everardo All---  (03-26-2020 pt does not check blood sugar at home)    Past Surgical History:  Procedure Laterality Date   AMPUTATION Left 04/28/2020   Procedure: LEFT LITTLE FINGER AMPUTATION;  Surgeon: Nadara Mustard, MD;  Location: [facility] OR;  Service: Orthopedics;  Laterality: Left;   CARDIAC CATHETERIZATION  12-27-2001  DR Jacinto Halim  &  05-26-2009  DR Eldridge Dace   RESULTS FOR BOTH ARE NORMAL CORONARIES AND PERSERVED LVF/ EF 60%   CARPAL TUNNEL RELEASE Bilateral right 09-16-2003;  left ?   CARPAL TUNNEL RELEASE Left 02/25/2015   Procedure: LEFT CARPAL TUNNEL RELEASE;  Surgeon: Betha Loa, MD;  Location: [facility];  Service: Orthopedics;  Laterality: Left;   CYSTOSCOPY N/A 10/10/2012   Procedure: CYSTOSCOPY CLOT EVACUATION FULGERATION OF BLEEDERS ;  Surgeon: Garnett Farm, MD;  Location: [facility];  Service: Urology;  Laterality: N/A;   CYSTOSCOPY  WITH BIOPSY N/A 11/26/2015   Procedure: CYSTOSCOPY WITH BIOPSY AND FULGURATION;  Surgeon: Ihor Gully, MD;  [facility];  Service: Urology;  Laterality: N/A;   ESOPHAGOGASTRODUODENOSCOPY  2014   LAPAROSCOPIC CHOLECYSTECTOMY  11-17-2010   ORCHIECTOMY Right 02/21/2016   Procedure: SCROTAL ORCHIECTOMY with TESTICULAR PROSTHESIS IMPLANT;  Surgeon: Ihor Gully, MD;  Location: [facility];  Service: Urology;  Laterality: Right;   ORCHIECTOMY Left 09/02/2018   Procedure: ORCHIECTOMY;  Surgeon: Ihor Gully, MD;  Location[facility];  Service: Urology;  Laterality: Left;   ROTATOR CUFF REPAIR Right 12/2004   TRANSURETHRAL RESECTION OF BLADDER TUMOR N/A 08/09/2012   Procedure: TRANSURETHRAL RESECTION OF BLADDER TUMOR (TURBT) WITH GYRUS WITH MITOMYCIN C;  Surgeon: Garnett Farm, MD;  Location: [facility];  Service: Urology;  Laterality: N/A;   TRANSURETHRAL RESECTION OF BLADDER TUMOR N/A 03/29/2020   Procedure: TRANSURETHRAL RESECTION OF BLADDER TUMOR (TURBT) and post-op instillation of gemcitabine;  Surgeon: [name], MD;  Location: [facility];  Service: Urology;  Laterality: N/A;   TRANSURETHRAL RESECTION OF BLADDER TUMOR WITH GYRUS (TURBT-GYRUS) N/A 02/27/2014   Procedure: TRANSURETHRAL RESECTION OF BLADDER TUMOR WITH GYRUS (TURBT-GYRUS);  Surgeon: Garnett Farm, MD;  Location: [facility];  Service: Urology;  Laterality: N/A;    Social History:  reports that he has been smoking cigarettes. He has a 57 pack-year smoking history. He has never used smokeless tobacco. He reports that he does not drink alcohol and does not use drugs.  Allergies:  Allergies  Allergen Reactions   Celecoxib Anaphylaxis, Swelling and Rash    Tongue swells   Hydrocodone Rash and Other (See Comments)    "blisters developed on arms"    Sulfa Antibiotics Rash   Sulfacetamide Sodium Rash    Medications Prior to  Admission  Medication Sig Dispense Refill   acetaminophen (TYLENOL) 325 MG tablet Take 2 tablets (650 mg total) by mouth every 6 (six) hours as needed for mild pain (or Fever >/= 101). 20 tablet 0   baclofen (LIORESAL) 10 MG tablet Take 10 mg by mouth 3 (three) times daily. Sister only gives it to Patient at Bedtime.     clopidogrel (PLAVIX) 75 MG tablet Take 75 mg by mouth daily. Wife called and spoke with nurse.  This is a medication the patient takes at home that was not entered at admission. Dr. Sibyl Parr informed.     DULoxetine (CYMBALTA) 60 MG capsule Take 60 mg by mouth daily. Wife called and spoke with nurse.  This is a medication the patient takes at home that was not entered at admission. Dr. Sibyl Parr informed.     HUMALOG KWIKPEN 200 UNIT/ML KwikPen Inject 20 Units into the skin in the morning, at noon, and at bedtime. (Patient taking differently: Inject 35 Units into the skin in the morning, at noon, and at bedtime. Sister stated Dr. Catalina Pizza her 35 units on both Insulin Pens)     hydroxychloroquine (PLAQUENIL) 200 MG tablet Take 200 mg by mouth 2 (two) times daily. Wife called and spoke with nurse.  This is a medication the patient takes at home that was not entered at admission. Dr. Sibyl Parr informed.     hydrOXYzine (ATARAX) 25 MG tablet Take 25 mg by mouth at bedtime as needed for anxiety or itching.     insulin glargine, 2 Unit Dial, (TOUJEO MAX SOLOSTAR) 300 UNIT/ML Solostar Pen Inject 40 Units into the skin 2 (two) times daily at 8 am and 10 pm. (Patient taking differently: Inject 35 Units into the skin 2 (two) times daily at 8 am and 10 pm. Sister stated Dr. Has Patient using 35 units on both insulin pens.)     lamoTRIgine (LAMICTAL) 150 MG tablet Take 150 mg by mouth 2 (two) times daily. Wife called and spoke with nurse.  This is a medication the patient takes at home that was not entered at admission. Dr. Sibyl Parr informed.     lurasidone (LATUDA) 20 MG TABS tablet Take 40 mg by mouth at bedtime.      omeprazole (PRILOSEC) 40 MG capsule Take 40 mg by mouth daily.     pregabalin (LYRICA) 200 MG capsule Take 200 mg by mouth 3 (three) times daily.     rOPINIRole (REQUIP) 0.5 MG tablet Take 0.5 mg by mouth at bedtime.     rosuvastatin (CRESTOR) 10 MG tablet Take 1 tablet (10 mg total) by mouth daily. (Patient taking differently: Take 10 mg by mouth at bedtime.) 30 tablet 0   tamsulosin (FLOMAX) 0.4 MG CAPS capsule Take 0.4 mg by mouth at bedtime.     venlafaxine XR (EFFEXOR-XR) 150 MG 24 hr capsule Take 150 mg by mouth daily with breakfast. Wife called and spoke with nurse.  This is a medication the patient takes at home that was not entered at admission. Dr. Sibyl Parr informed.     nicotine (NICODERM CQ - DOSED IN MG/24 HOURS) 21 mg/24hr patch Place 1 patch (21 mg total) onto the skin daily. 28 patch 0    Blood pressure 122/61, pulse 80, temperature 97.7 F (36.5 C), temperature source Oral, resp. rate (!) 21, height 6' (1.829 m), weight 89.4 kg, SpO2 99%. Physical Exam:  General: pleasant, WD, male who is laying in bed in NAD HEENT: head is normocephalic, atraumatic.  Sclera are noninjected.  PERRL.  Ears and nose without any masses or lesions.  Mouth is pink and moist Heart: regular, rate, and rhythm.  Normal s1,s2. No obvious murmurs, gallops, or rubs noted.  Palpable radial and pedal pulses bilaterally Lungs: CTAB, no wheezes, rhonchi, or rales noted.  Respiratory effort nonlabored Abd: soft, NT, ND, +BS, no masses, hernias, or organomegaly MS: all 4 extremities are symmetrical with no cyanosis, clubbing, or edema. Skin: 1 cm right buttock abscess near gluteal cleft draining purulence with surrounding erythema and induration Neuro: Cranial nerves 2-12 grossly intact, sensation is normal throughout Psych: A&Ox3 with an appropriate affect.   Results for orders placed or performed during the hospital encounter of 09/24/22 (from the past  48 hour(s))  Protime-INR     Status: None   Collection  Time: 09/24/22  3:55 PM  Result Value Ref Range   Prothrombin Time 12.8 11.4 - 15.2 seconds   INR 0.9 0.8 - 1.2    Comment: (NOTE) INR goal varies based on device and disease states. Performed at [facility] Lab, [address], [place], [place] [address]   APTT     Status: None   Collection Time: 09/24/22  3:55 PM  Result Value Ref Range   aPTT 30 24 - 36 seconds    Comment: Performed at [facility], [address], [place], [place] [address]  CBC     Status: Abnormal   Collection Time: 09/24/22  3:55 PM  Result Value Ref Range   WBC 7.6 4.0 - 10.5 K/uL   RBC 4.69 4.22 - 5.81 MIL/uL   Hemoglobin 12.6 (L) 13.0 - 17.0 g/dL   HCT 09.8 (L) 11.9 - 14.7 %   MCV 79.7 (L) 80.0 - 100.0 fL   MCH 26.9 26.0 - 34.0 pg   MCHC 33.7 30.0 - 36.0 g/dL   RDW 82.9 56.2 - 13.0 %   Platelets 328 150 - 400 K/uL   nRBC 0.0 0.0 - 0.2 %    Comment: Performed at [facility], [address], [place], [place] [address]  Differential     Status: Abnormal   Collection Time: 09/24/22  3:55 PM  Result Value Ref Range   Neutrophils Relative % 47 %   Neutro Abs 3.6 1.7 - 7.7 K/uL   Lymphocytes Relative 39 %   Lymphs Abs 3.0 0.7 - 4.0 K/uL   Monocytes Relative 9 %   Monocytes Absolute 0.7 0.1 - 1.0 K/uL   Eosinophils Relative 2 %   Eosinophils Absolute 0.1 0.0 - 0.5 K/uL   Basophils Relative 1 %   Basophils Absolute 0.1 0.0 - 0.1 K/uL   Immature Granulocytes 2 %   Abs Immature Granulocytes 0.12 (H) 0.00 - 0.07 K/uL    Comment: Performed at [facility], [address], [place], [place] [address]  Comprehensive metabolic panel     Status: Abnormal   Collection Time: 09/24/22  3:55 PM  Result Value Ref Range   Sodium 139 135 - 145 mmol/L   Potassium 4.0 3.5 - 5.1 mmol/L   Chloride 105 98 - 111 mmol/L   CO2 20 (L) 22 - 32 mmol/L   Glucose, Bld 127 (H) 70 - 99 mg/dL    Comment: Glucose reference range applies only to samples taken after fasting for at least 8 hours.   BUN 20 6 -  20 mg/dL   Creatinine, Ser 9.52 (H) 0.61 - 1.24 mg/dL   Calcium 9.3 8.9 - 84.1 mg/dL   Total Protein 6.3 (L) 6.5 - 8.1 g/dL   Albumin 3.2 (L) 3.5 - 5.0 g/dL   AST 20 15 - 41 U/L   ALT 16 0 - 44 U/L   Alkaline Phosphatase 72 38 - 126 U/L   Total Bilirubin 0.6 0.3 - 1.2 mg/dL   GFR, Estimated 56 (L) >60 mL/min    Comment: (NOTE) Calculated using the CKD-EPI Creatinine Equation (2021)    Anion gap 14 5 - 15    Comment: Performed at [facility], [address], [place], [place] [address]  Ethanol     Status: None   Collection Time: 09/24/22  3:55 PM  Result Value Ref Range  Alcohol, Ethyl (B) <10 <10 mg/dL    Comment: (NOTE) Lowest detectable limit for serum alcohol is 10 mg/dL.  For medical purposes only. Performed at [facility], [address], [place], [place] [address]   CBG monitoring, ED     Status: Abnormal   Collection Time: 09/24/22  3:57 PM  Result Value Ref Range   Glucose-Capillary 119 (H) 70 - 99 mg/dL    Comment: Glucose reference range applies only to samples taken after fasting for at least 8 hours.   Comment 1 Notify RN    Comment 2 Document in Chart   I-stat chem 8, ED     Status: Abnormal   Collection Time: 09/24/22  4:04 PM  Result Value Ref Range   Sodium 139 135 - 145 mmol/L   Potassium 4.0 3.5 - 5.1 mmol/L   Chloride 108 98 - 111 mmol/L   BUN 24 (H) 6 - 20 mg/dL   Creatinine, Ser 6.04 (H) 0.61 - 1.24 mg/dL   Glucose, Bld 540 (H) 70 - 99 mg/dL    Comment: Glucose reference range applies only to samples taken after fasting for at least 8 hours.   Calcium, Ion 1.15 1.15 - 1.40 mmol/L   TCO2 22 22 - 32 mmol/L   Hemoglobin 11.6 (L) 13.0 - 17.0 g/dL   HCT 98.1 (L) 19.1 - 47.8 %  Urinalysis, Routine w reflex microscopic -Urine, Clean Catch     Status: Abnormal   Collection Time: 09/24/22  5:29 PM  Result Value Ref Range   Color, Urine YELLOW YELLOW   APPearance CLOUDY (A) CLEAR   Specific Gravity, Urine 1.027 1.005 - 1.030   pH 6.0 5.0  - 8.0   Glucose, UA >=500 (A) NEGATIVE mg/dL   Hgb urine dipstick SMALL (A) NEGATIVE   Bilirubin Urine NEGATIVE NEGATIVE   Ketones, ur NEGATIVE NEGATIVE mg/dL   Protein, ur 295 (A) NEGATIVE mg/dL   Nitrite NEGATIVE NEGATIVE   Leukocytes,Ua LARGE (A) NEGATIVE   RBC / HPF 6-10 0 - 5 RBC/hpf   WBC, UA >50 0 - 5 WBC/hpf   Bacteria, UA RARE (A) NONE SEEN   Squamous Epithelial / HPF 0-5 0 - 5 /HPF   WBC Clumps PRESENT    Mucus PRESENT    Hyaline Casts, UA PRESENT     Comment: Performed at [facility], [address], [place], [place] [address]  Rapid urine drug screen (hospital performed)     Status: None   Collection Time: 09/24/22  5:29 PM  Result Value Ref Range   Opiates NONE DETECTED NONE DETECTED   Cocaine NONE DETECTED NONE DETECTED   Benzodiazepines NONE DETECTED NONE DETECTED   Amphetamines NONE DETECTED NONE DETECTED   Tetrahydrocannabinol NONE DETECTED NONE DETECTED   Barbiturates NONE DETECTED NONE DETECTED    Comment: (NOTE) DRUG SCREEN FOR MEDICAL PURPOSES ONLY.  IF CONFIRMATION IS NEEDED FOR ANY PURPOSE, NOTIFY LAB WITHIN 5 DAYS.  LOWEST DETECTABLE LIMITS FOR URINE DRUG SCREEN Drug Class                     Cutoff (ng/mL) Amphetamine and metabolites    1000 Barbiturate and metabolites    200 Benzodiazepine                 200 Opiates and metabolites        300 Cocaine and metabolites        300 THC  50 Performed at [facility], [address] [place], [place] [address]   Lactic acid, plasma     Status: None   Collection Time: 09/24/22  5:42 PM  Result Value Ref Range   Lactic Acid, Venous 1.6 0.5 - 1.9 mmol/L    Comment: Performed at [facility], [address], [place], [place] [address]  Blood culture (routine x 2)     Status: None (Preliminary result)   Collection Time: 09/24/22  6:18 PM   Specimen: BLOOD LEFT HAND  Result Value Ref Range   Specimen Description BLOOD LEFT HAND    Special Requests       BOTTLES DRAWN AEROBIC AND ANAEROBIC Blood Culture results may not be optimal due to an inadequate volume of blood received in culture bottles   Culture      NO GROWTH < 24 HOURS Performed at [facility], [address] [place], [place] [address]    Report Status PENDING   Blood culture (routine x 2)     Status: None (Preliminary result)   Collection Time: 09/24/22  6:34 PM   Specimen: BLOOD LEFT ARM  Result Value Ref Range   Specimen Description BLOOD LEFT ARM    Special Requests      BOTTLES DRAWN AEROBIC AND ANAEROBIC Blood Culture results may not be optimal due to an inadequate volume of blood received in culture bottles   Culture      NO GROWTH < 24 HOURS Performed at [facility], [address], [place], [place] [address]    Report Status PENDING   CBG monitoring, ED     Status: Abnormal   Collection Time: 09/24/22  9:48 PM  Result Value Ref Range   Glucose-Capillary 145 (H) 70 - 99 mg/dL    Comment: Glucose reference range applies only to samples taken after fasting for at least 8 hours.  Troponin I (High Sensitivity)     Status: None   Collection Time: 09/25/22 12:02 AM  Result Value Ref Range   Troponin I (High Sensitivity) 5 <18 ng/L    Comment: (NOTE) Elevated high sensitivity troponin I (hsTnI) values and significant  changes across serial measurements may suggest ACS but many other  chronic and acute conditions are known to elevate hsTnI results.  Refer to the "Links" section for chest pain algorithms and additional  guidance. Performed at [facility], [address] [place], [place] [address]   Hemoglobin A1c     Status: Abnormal   Collection Time: 09/25/22 12:02 AM  Result Value Ref Range   Hgb A1c MFr Bld 12.4 (H) 4.8 - 5.6 %    Comment: (NOTE) Pre diabetes:          5.7%-6.4%  Diabetes:              >6.4%  Glycemic control for   <7.0% adults with diabetes    Mean Plasma Glucose 309.18 mg/dL    Comment: Performed at [facility], [address] [place], [place] [address]  Comprehensive metabolic panel     Status: Abnormal   Collection Time: 09/25/22  2:02 AM  Result Value Ref Range   Sodium 136 135 - 145 mmol/L   Potassium 4.1 3.5 - 5.1 mmol/L   Chloride 105 98 - 111 mmol/L   CO2 20 (L) 22 - 32 mmol/L   Glucose, Bld 152 (H) 70 - 99 mg/dL    Comment: Glucose reference range  applies only to samples taken after fasting for at least 8 hours.   BUN 21 (H) 6 - 20 mg/dL   Creatinine, Ser 3.76 0.61 - 1.24 mg/dL   Calcium 8.7 (L) 8.9 - 10.3 mg/dL   Total Protein 5.0 (L) 6.5 - 8.1 g/dL   Albumin 2.7 (L) 3.5 - 5.0 g/dL   AST 17 15 - 41 U/L   ALT 17 0 - 44 U/L   Alkaline Phosphatase 65 38 - 126 U/L   Total Bilirubin 0.7 0.3 - 1.2 mg/dL   GFR, Estimated >28 >31 mL/min    Comment: (NOTE) Calculated using the CKD-EPI Creatinine Equation (2021)    Anion gap 11 5 - 15    Comment: Performed at [facility], [address] [place], [place] [address]  CBC     Status: Abnormal   Collection Time: 09/25/22  2:02 AM  Result Value Ref Range   WBC 7.0 4.0 - 10.5 K/uL   RBC 4.03 (L) 4.22 - 5.81 MIL/uL   Hemoglobin 11.2 (L) 13.0 - 17.0 g/dL   HCT 60.7 (L) 37.1 - 06.2 %   MCV 83.9 80.0 - 100.0 fL   MCH 27.8 26.0 - 34.0 pg   MCHC 33.1 30.0 - 36.0 g/dL   RDW 69.4 85.4 - 62.7 %   Platelets 247 150 - 400 K/uL   nRBC 0.0 0.0 - 0.2 %    Comment: Performed at [facility] Lab, [address], [place], [place] [address]  Lipid panel     Status: Abnormal   Collection Time: 09/25/22  2:02 AM  Result Value Ref Range   Cholesterol 275 (H) 0 - 200 mg/dL   Triglycerides 938 (H) <150 mg/dL   HDL 52 >18 mg/dL   Total CHOL/HDL Ratio 5.3 RATIO   VLDL 59 (H) 0 - 40 mg/dL   LDL Cholesterol 299 (H) 0 - 99 mg/dL    Comment:        Total Cholesterol/HDL:CHD Risk Coronary Heart Disease Risk Table                     Men   Women  1/2 Average Risk   3.4   3.3  Average Risk       5.0   4.4  2 X Average Risk   9.6   7.1  3 X  Average Risk  23.4   11.0        Use the calculated Patient Ratio above and the CHD Risk Table to determine the patient's CHD Risk.        ATP III CLASSIFICATION (LDL):  <100     mg/dL   Optimal  371-696  mg/dL   Near or Above                    Optimal  130-159  mg/dL   Borderline  789-381  mg/dL   High  >017     mg/dL   Very High Performed at [facility], [address] [place], [place] [address]   TSH     Status: None   Collection Time: 09/25/22  2:02 AM  Result Value Ref Range   TSH 2.731 0.350 - 4.500 uIU/mL    Comment: Performed by a 3rd Generation assay with a functional sensitivity of <=0.01 uIU/mL. Performed at [facility], [address], [place], [place] [address]   CBG monitoring, ED     Status: Abnormal  Collection Time: 09/25/22  8:03 AM  Result Value Ref Range   Glucose-Capillary 231 (H) 70 - 99 mg/dL    Comment: Glucose reference range applies only to samples taken after fasting for at least 8 hours.  CBG monitoring, ED     Status: Abnormal   Collection Time: 09/25/22 11:11 AM  Result Value Ref Range   Glucose-Capillary 235 (H) 70 - 99 mg/dL    Comment: Glucose reference range applies only to samples taken after fasting for at least 8 hours.  Glucose, capillary     Status: Abnormal   Collection Time: 09/25/22  4:04 PM  Result Value Ref Range   Glucose-Capillary 227 (H) 70 - 99 mg/dL    Comment: Glucose reference range applies only to samples taken after fasting for at least 8 hours.   MR CERVICAL SPINE W WO CONTRAST  Result Date: 09/25/2022 CLINICAL DATA:  Myelopathy, rule out abscess EXAM: MRI CERVICAL SPINE WITHOUT AND WITH CONTRAST TECHNIQUE: Multiplanar and multiecho pulse sequences of the cervical spine, to include the craniocervical junction and cervicothoracic junction, were obtained without and with intravenous contrast. CONTRAST:  9mL GADAVIST GADOBUTROL 1 MMOL/ML IV SOLN COMPARISON:  05/25/2022 FINDINGS: Evaluation is somewhat limited  by motion artifact. Alignment: No significant listhesis. Vertebrae: No acute fracture, evidence of discitis, or suspicious osseous lesion. No abnormal enhancement. Cord: Normal signal and morphology.  No abnormal enhancement. Posterior Fossa, vertebral arteries, paraspinal tissues: Negative. Disc levels: C2-C3: No significant disc bulge. No spinal canal stenosis or neuroforaminal narrowing. C3-C4: No significant disc bulge. Facet arthropathy, with fused left facets that are better seen on the prior exam. No spinal canal stenosis or neural foraminal narrowing. C4-C5: No significant disc bulge. No spinal canal stenosis or neuroforaminal narrowing. C5-C6: Mild disc bulge, which indents the ventral thecal sac but does not deform the spinal cord. No spinal canal stenosis or neural foraminal narrowing. C6-C7: Minimal disc bulge. Facet and uncovertebral hypertrophy. No spinal canal stenosis. No neural foraminal narrowing. C7-T1: No significant disc bulge. No spinal canal stenosis or neuroforaminal narrowing. IMPRESSION: 1. No evidence of discitis osteomyelitis or epidural abscess. 2. No spinal canal stenosis or neural foraminal narrowing. Electronically Signed   By: Wiliam Ke M.D.   On: 09/25/2022 03:25   DG Chest 2 View  Result Date: 09/25/2022 CLINICAL DATA:  Stroke EXAM: CHEST - 2 VIEW COMPARISON:  Radiograph 08/25/2022 and CT 09/24/2022 FINDINGS: Stable cardiomediastinal silhouette. Aortic atherosclerotic calcification. Low lung volumes accentuated pulmonary vascularity. No focal consolidation, pleural effusion, or pneumothorax. No displaced rib fractures. IMPRESSION: Low lung volumes without acute cardiopulmonary process. Electronically Signed   By: Minerva Fester M.D.   On: 09/25/2022 00:31   MR BRAIN WO CONTRAST  Result Date: 09/24/2022 CLINICAL DATA:  Headache, neuro deficit EXAM: MRI HEAD WITHOUT CONTRAST TECHNIQUE: Multiplanar, multiecho pulse sequences of the brain and surrounding structures were  obtained without intravenous contrast. COMPARISON:  08/27/2022 MRI head, correlation is also made with 09/24/2022 CT head FINDINGS: Evaluation is somewhat limited by motion artifact. Brain: No restricted diffusion to suggest acute or subacute infarct. No acute hemorrhage, mass, mass effect, or midline shift. No hydrocephalus or extra-axial collection. Normal pituitary and craniocervical junction. No hemosiderin deposition to suggest remote hemorrhage. Normal cerebral volume for age. Vascular: Normal arterial flow voids. Skull and upper cervical spine: Normal marrow signal. Sinuses/Orbits: Clear paranasal sinuses. No acute finding in the orbits. Other: The mastoid air cells are well aerated. IMPRESSION: No acute intracranial process. No evidence of acute or subacute infarct.  Electronically Signed   By: Wiliam Ke M.D.   On: 09/24/2022 22:59   CT ANGIO HEAD NECK W WO CM  Result Date: 09/24/2022 CLINICAL DATA:  Neuro deficit, acute, stroke suspected. EXAM: CT ANGIOGRAPHY HEAD AND NECK WITH AND WITHOUT CONTRAST TECHNIQUE: Multidetector CT imaging of the head and neck was performed using the standard protocol during bolus administration of intravenous contrast. Multiplanar CT image reconstructions and MIPs were obtained to evaluate the vascular anatomy. Carotid stenosis measurements (when applicable) are obtained utilizing NASCET criteria, using the distal internal carotid diameter as the denominator. RADIATION DOSE REDUCTION: This exam was performed according to the departmental dose-optimization program which includes automated exposure control, adjustment of the mA and/or kV according to patient size and/or use of iterative reconstruction technique. CONTRAST:  OMNIPAQUE IOHEXOL 350 MG/ML SOLN COMPARISON:  CT angio head and neck 06/10/2020 FINDINGS: CTA NECK FINDINGS Aortic arch: Atherosclerotic calcifications are present at the aortic arch and great vessel origins. No significant stenosis of greater than  50% is present relative to the more distal vessels. Right carotid system: Atherosclerotic changes are noted along the right common carotid artery without significant stenosis. Calcifications are present at the aortic arch and proximal right ICA without significant stenosis. The more distal right ICA is normal. No significant interval change is present. Left carotid system: The left common carotid artery demonstrates atherosclerotic changes without significant stenosis. Calcifications are present at the aortic arch and proximal left ICA without a significant stenosis relative to the more distal vessel. Vertebral arteries: The right vertebral artery is the dominant vessel. Atherosclerotic calcifications are present in the V1 segment. No significant stenosis is present in either vertebral artery in the neck. Skeleton: No acute abnormalities are present. No focal osseous lesions are present. The patient is edentulous. Other neck: Soft tissues the neck are otherwise unremarkable. Salivary glands are within normal limits. Thyroid is normal. No significant adenopathy is present. No focal mucosal or submucosal lesions are present. Upper chest: The lung apices are clear. The thoracic inlet is within normal limits. Review of the MIP images confirms the above findings CTA HEAD FINDINGS Anterior circulation: Atherosclerotic calcifications are present within the cavernous internal carotid arteries bilaterally without a significant stenosis relative to the more distal vessels. The A1 and M1 segments are normal. The anterior communicating artery is patent. MCA bifurcations are within normal limits. ACA and MCA branch vessels are normal. No aneurysm is present. Posterior circulation: The right vertebral artery is the dominant vessel. PICA origin is visualized and normal. Scratched at the PICA origins are visualized and normal. The vertebrobasilar junction and basilar artery normal. Both posterior cerebral arteries scratched at the  superior cerebellar arteries are patent. Both posterior cerebral arteries originate from the basilar tip. The PCA branch vessels are normal bilaterally. Venous sinuses: The dural sinuses are patent. The straight sinus and deep cerebral veins are intact. Cortical veins are within normal limits. No significant vascular malformation is evident. Review of the MIP images confirms the above findings IMPRESSION: 1. No significant proximal stenosis, aneurysm, or branch vessel occlusion within the Circle of Willis. 2. Stable atherosclerotic calcifications within the cavernous internal carotid arteries without significant stenosis. 3. Stable atherosclerotic changes within the cavernous internal carotid arteries bilaterally without significant stenosis relative to the more distal vessels. 4.  Aortic Atherosclerosis (ICD10-I70.0). Electronically Signed   By: Marin Roberts M.D.   On: 09/24/2022 17:35   CT Angio Chest Aorta W and/or Wo Contrast  Result Date: 09/24/2022 CLINICAL DATA:  Neuro  deficit, acute aortic syndrome suspected EXAM: CT ANGIOGRAPHY CHEST, ABDOMEN AND PELVIS TECHNIQUE: Non-contrast CT of the chest was initially obtained. Multidetector CT imaging through the chest, abdomen and pelvis was performed using the standard protocol during bolus administration of intravenous contrast. Multiplanar reconstructed images and MIPs were obtained and reviewed to evaluate the vascular anatomy. RADIATION DOSE REDUCTION: This exam was performed according to the departmental dose-optimization program which includes automated exposure control, adjustment of the mA and/or kV according to patient size and/or use of iterative reconstruction technique. CONTRAST:  OMNIPAQUE IOHEXOL 350 MG/ML SOLN COMPARISON:  Chest radiograph 08/25/2022; CT abdomen and pelvis 06/21/2022; CTA chest abdomen and pelvis 06/10/2020 FINDINGS: CTA CHEST FINDINGS Cardiovascular: Normal heart size. No pericardial effusion. No acute aortic  syndrome. Aortic atherosclerotic and coronary artery atherosclerotic calcification. Mediastinum/Nodes: Unremarkable esophagus. Secretions in the posterior trachea near the thoracic inlet. No thoracic adenopathy. Lungs/Pleura: Lungs are clear. No pleural effusion or pneumothorax. Mild diffuse bronchial wall thickening. Mild mosaic attenuation of the lungs compatible with air trapping. Musculoskeletal: No chest wall abnormality. No acute or significant osseous findings. Review of the MIP images confirms the above findings. CTA ABDOMEN AND PELVIS FINDINGS VASCULAR Aorta: Moderate calcified plaque causes up to mild narrowing near the bifurcation. No aneurysm or dissection. Celiac: Patent without aneurysm or dissection. SMA: Patent without aneurysm or dissection. Renals: Calcified plaque at the origin of the right renal artery causes moderate narrowing. Patent left renal artery. No aneurysm or dissection IMA: Patent. Inflow: Scattered calcified atherosclerotic plaque causing multifocal areas of mild narrowing. No aneurysm or dissection. Veins: Patent portal vein. Review of the MIP images confirms the above findings. NON-VASCULAR Hepatobiliary: Cholecystectomy. Hepatic steatosis. No biliary dilation. Pancreas: Unremarkable. No pancreatic ductal dilatation or surrounding inflammatory changes. Spleen: Normal in size without focal abnormality. Adrenals/Urinary Tract: Unremarkable adrenal glands and kidneys. No hydronephrosis or urinary calculi. Unremarkable bladder. Stomach/Bowel: Stomach is within normal limits. Normal caliber large and small bowel. Moderate colonic stool load. Normal appendix. Lymphatic: No lymphadenopathy. Reproductive: No acute abnormality. Other: Subcutaneous peripherally enhancing gas and fluid collection along the right posterior gluteal cleft measuring 3.5 x 2.3 cm (6/344). Mild adjacent stranding. No free intraperitoneal fluid or air. Musculoskeletal: No acute osseous abnormality. Review of the MIP  images confirms the above findings. IMPRESSION: 1. No acute aortic syndrome. 2. Subcutaneous peripherally enhancing gas and fluid collection along the right posterior gluteal cleft measuring 3.5 x 2.3 cm, concerning for abscess. 3. Diffuse bronchial wall thickening. Layering secretions in the trachea near the thoracic inlet. Query aspiration. 4. Hepatic steatosis. 5. Moderate stool load.  Correlate for constipation. Aortic Atherosclerosis (ICD10-I70.0). Electronically Signed   By: Minerva Fester M.D.   On: 09/24/2022 17:27   CT HEAD CODE STROKE WO CONTRAST  Result Date: 09/24/2022 CLINICAL DATA:  Code stroke. Neuro deficit, acute, stroke suspected. EXAM: CT HEAD WITHOUT CONTRAST TECHNIQUE: Contiguous axial images were obtained from the base of the skull through the vertex without intravenous contrast. RADIATION DOSE REDUCTION: This exam was performed according to the departmental dose-optimization program which includes automated exposure control, adjustment of the mA and/or kV according to patient size and/or use of iterative reconstruction technique. COMPARISON:  CT head without contrast 07/18/2022. MR head without contrast 08/27/2022 FINDINGS: Brain: No acute infarct, hemorrhage, or mass lesion is present. No significant white matter lesions are present. Deep brain nuclei are within normal limits. Insular ribbon is normal bilaterally. The ventricles are of normal size. No significant extraaxial fluid collection is present. The brainstem and cerebellum  are within normal limits. Midline structures are within normal limits. Vascular: Atherosclerotic calcifications are present within the cavernous internal carotid arteries bilaterally. No hyperdense vessel is present. Skull: Calvarium is intact. No focal lytic or blastic lesions are present. No significant extracranial soft tissue lesion is present. Sinuses/Orbits: The paranasal sinuses and mastoid air cells are clear. The globes and orbits are within normal  limits. ASPECTS [facility] Stroke Program Early CT Score) - Ganglionic level infarction (caudate, lentiform nuclei, internal capsule, insula, M1-M3 cortex): 7/7 - Supraganglionic infarction (M4-M6 cortex): 3/3 Total score (0-10 with 10 being normal): 10/10 IMPRESSION: 1. Negative CT of the head. 2. Aspects is 10/10. The above was relayed via text pager to Dr. Otelia Limes on 09/24/2022 at 16:15 . Electronically Signed   By: Marin Roberts M.D.   On: 09/24/2022 16:17      Assessment/Plan Right gluteal abscess - possible pilonidal abscess - CT 7/14with gas and fluid collection 3.5 x 2.3 cm, concerning for abscess. - s/p I&D by EDP 7/14 - no leukocytosis, afebrile and HD stable   I discussed the diagnosis with the patient.  This may be a pilonidal abscess or a sebaceous.  Will need further debridement in the OR this week.  I will make NPO and hold his plavix and discuss with our emergent general surgeon of the week, Dr. Carolynne Edouard regarding plans  FEN VTE ID  - per primary -  CAD COPD Hx of bladder cancer Bipolar II disorder/PTSD Hx of TBI Hx of TIA HTN HLD Seizure disorder T2DM Peripheral vascular disease Chronic pain syndrome  I reviewed hospitalist notes, last 24 h vitals and pain scores, last 48 h intake and output, last 24 h labs and trends, and last 24 h imaging results.   Carman Ching, MD [facility]  Please see Amion for pager number during day hours 7:00am-4:30pm

## 2022-09-25 NOTE — Progress Notes (Signed)
*  PRELIMINARY RESULTS* Echocardiogram 2D Echocardiogram has been performed.  Laddie Aquas 09/25/2022, 5:04 PM

## 2022-09-26 DIAGNOSIS — G9340 Encephalopathy, unspecified: Secondary | ICD-10-CM | POA: Diagnosis not present

## 2022-09-26 LAB — LIPID PANEL
Cholesterol: 265 mg/dL — ABNORMAL HIGH (ref 0–200)
HDL: 42 mg/dL (ref >40)
LDL Cholesterol: UNDETERMINED mg/dL (ref 0–99)
Total CHOL/HDL Ratio: 6.3 RATIO
Triglycerides: 564 mg/dL — ABNORMAL HIGH (ref <150)
VLDL: UNDETERMINED mg/dL (ref 0–40)

## 2022-09-26 LAB — CBC WITH DIFFERENTIAL/PLATELET
Abs Immature Granulocytes: 0.06 10*3/uL (ref 0.00–0.07)
Basophils Absolute: 0 10*3/uL (ref 0.0–0.1)
Basophils Relative: 1 %
Eosinophils Absolute: 0.1 10*3/uL (ref 0.0–0.5)
Eosinophils Relative: 2 %
HCT: 33.1 % — ABNORMAL LOW (ref 39.0–52.0)
Hemoglobin: 11.2 g/dL — ABNORMAL LOW (ref 13.0–17.0)
Immature Granulocytes: 1 %
Lymphocytes Relative: 30 %
Lymphs Abs: 1.4 10*3/uL (ref 0.7–4.0)
MCH: 28.1 pg (ref 26.0–34.0)
MCHC: 33.8 g/dL (ref 30.0–36.0)
MCV: 83.2 fL (ref 80.0–100.0)
Monocytes Absolute: 0.3 10*3/uL (ref 0.1–1.0)
Monocytes Relative: 7 %
Neutro Abs: 2.8 10*3/uL (ref 1.7–7.7)
Neutrophils Relative %: 59 %
Platelets: 264 10*3/uL (ref 150–400)
RBC: 3.98 MIL/uL — ABNORMAL LOW (ref 4.22–5.81)
RDW: 13.9 % (ref 11.5–15.5)
WBC: 4.7 10*3/uL (ref 4.0–10.5)
nRBC: 0 % (ref 0.0–0.2)

## 2022-09-26 LAB — COMPREHENSIVE METABOLIC PANEL
ALT: 18 U/L (ref 0–44)
AST: 15 U/L (ref 15–41)
Albumin: 2.7 g/dL — ABNORMAL LOW (ref 3.5–5.0)
Alkaline Phosphatase: 75 U/L (ref 38–126)
Anion gap: 10 (ref 5–15)
BUN: 21 mg/dL — ABNORMAL HIGH (ref 6–20)
CO2: 19 mmol/L — ABNORMAL LOW (ref 22–32)
Calcium: 8.5 mg/dL — ABNORMAL LOW (ref 8.9–10.3)
Chloride: 104 mmol/L (ref 98–111)
Creatinine, Ser: 1.27 mg/dL — ABNORMAL HIGH (ref 0.61–1.24)
GFR, Estimated: >60 mL/min (ref >60)
Glucose, Bld: 294 mg/dL — ABNORMAL HIGH (ref 70–99)
Potassium: 4.1 mmol/L (ref 3.5–5.1)
Sodium: 133 mmol/L — ABNORMAL LOW (ref 135–145)
Total Bilirubin: 0.6 mg/dL (ref 0.3–1.2)
Total Protein: 5.3 g/dL — ABNORMAL LOW (ref 6.5–8.1)

## 2022-09-26 LAB — BRAIN NATRIURETIC PEPTIDE: B Natriuretic Peptide: 60.1 pg/mL (ref 0.0–100.0)

## 2022-09-26 LAB — GLUCOSE, CAPILLARY
Glucose-Capillary: 249 mg/dL — ABNORMAL HIGH (ref 70–99)
Glucose-Capillary: 235 mg/dL — ABNORMAL HIGH (ref 70–99)
Glucose-Capillary: 240 mg/dL — ABNORMAL HIGH (ref 70–99)
Glucose-Capillary: 234 mg/dL — ABNORMAL HIGH (ref 70–99)

## 2022-09-26 LAB — C-REACTIVE PROTEIN: CRP: 0.9 mg/dL (ref <1.0)

## 2022-09-26 LAB — MRSA NEXT GEN BY PCR, NASAL: MRSA by PCR Next Gen: DETECTED — AB

## 2022-09-26 LAB — TSH: TSH: 4.459 u[IU]/mL (ref 0.350–4.500)

## 2022-09-26 LAB — PROCALCITONIN: Procalcitonin: <0.1 ng/mL

## 2022-09-26 LAB — LDL CHOLESTEROL, DIRECT: Direct LDL: 134 mg/dL — ABNORMAL HIGH (ref 0–99)

## 2022-09-26 MED ORDER — CARVEDILOL 6.25 MG PO TABS
6.2500 mg | ORAL_TABLET | Freq: Two times a day (BID) | ORAL | Status: DC
  Administered 2022-09-26: 6.25 mg via ORAL
  Filled 2022-09-26: qty 1

## 2022-09-26 MED ORDER — AMLODIPINE BESYLATE 5 MG PO TABS
5.0000 mg | ORAL_TABLET | Freq: Every day | ORAL | Status: DC
  Administered 2022-09-26: 5 mg via ORAL
  Filled 2022-09-26: qty 1

## 2022-09-26 MED ORDER — CHLORHEXIDINE GLUCONATE CLOTH 2 % EX PADS: 6.0000 | MEDICATED_PAD | Freq: Once | CUTANEOUS | Status: DC

## 2022-09-26 MED ORDER — CHLORHEXIDINE GLUCONATE CLOTH 2 % EX PADS: 6.0000 | MEDICATED_PAD | Freq: Every day | CUTANEOUS | Status: DC

## 2022-09-26 MED ORDER — PIPERACILLIN-TAZOBACTAM 3.375 G IVPB
3.3750 g | Freq: Three times a day (TID) | INTRAVENOUS | Status: DC
  Administered 2022-09-26 – 2022-09-29 (×9): 3.375 g via INTRAVENOUS
  Filled 2022-09-26 (×10): qty 50

## 2022-09-26 MED ORDER — TAMSULOSIN HCL 0.4 MG PO CAPS
0.4000 mg | ORAL_CAPSULE | Freq: Every day | ORAL | Status: DC
  Administered 2022-09-26 – 2022-09-29 (×4): 0.4 mg via ORAL
  Filled 2022-09-26 (×4): qty 1

## 2022-09-26 MED ORDER — EZETIMIBE 10 MG PO TABS
10.0000 mg | ORAL_TABLET | Freq: Every day | ORAL | Status: DC
  Administered 2022-09-26 – 2022-09-30 (×5): 10 mg via ORAL
  Filled 2022-09-26 (×5): qty 1

## 2022-09-26 MED ORDER — CLOPIDOGREL BISULFATE 75 MG PO TABS
75.0000 mg | ORAL_TABLET | Freq: Every day | ORAL | Status: DC
  Administered 2022-09-26: 75 mg via ORAL
  Filled 2022-09-26: qty 1

## 2022-09-26 MED ORDER — HYDRALAZINE HCL 20 MG/ML IJ SOLN
10.0000 mg | Freq: Four times a day (QID) | INTRAMUSCULAR | Status: DC | PRN
  Administered 2022-09-26: 10 mg via INTRAVENOUS
  Filled 2022-09-26: qty 1

## 2022-09-26 MED ORDER — CARVEDILOL 3.125 MG PO TABS
3.1250 mg | ORAL_TABLET | Freq: Two times a day (BID) | ORAL | Status: DC
  Administered 2022-09-26: 3.125 mg via ORAL
  Filled 2022-09-26: qty 1

## 2022-09-26 NOTE — Inpatient Diabetes Management (Signed)
Inpatient Diabetes Program Recommendations  AACE/ADA: New Consensus Statement on Inpatient Glycemic Control (2015)  Target Ranges:  Prepandial:   less than 140 mg/dL      Peak postprandial:   less than 180 mg/dL (1-2 hours)      Critically ill patients:  140 - 180 mg/dL   Lab Results  Component Value Date   GLUCAP 249 (H) 09/26/2022   HGBA1C 12.4 (H) 09/25/2022    Review of Glycemic Control  Latest Reference Range & Units 09/25/22 08:03 09/25/22 11:11 09/25/22 16:04 09/25/22 21:14 09/26/22 07:49  Glucose-Capillary 70 - 99 mg/dL 884 (H) 166 (H) 063 (H) 266 (H) 249 (H)   Diabetes history: DM 2 Outpatient Diabetes medications: Humalog 35 units tid, Toujeo 35 units bid Current orders for Inpatient glycemic control:  Semglee 10 units Daily Novolog 0-15 units tid   Inpatient Diabetes Program Recommendations:    Note: Glucose trends in the 200 range  -   Pt may benefit from an increase in basal insulin. Pt is on more basal insulin at home. MD to assess during rounds.  Thanks,  Christena Deem RN, MSN, BC-ADM Inpatient Diabetes Coordinator Team Pager [phone] (8a-5p)

## 2022-09-26 NOTE — Progress Notes (Signed)
Pharmacy Antibiotic Note  Vincent [name] is a 57 y.o. male admitted on 09/24/2022 with sepsis/pilonidal abscess.  Pharmacy has been consulted to discontinue Cefepime and begin Zosyn to cover anaerobes.   Plan: Zosyn q8 extended effusion   Height: 6' (182.9 cm) Weight: 89.4 kg (197 lb 1.5 oz) IBW/kg (Calculated) : 77.6  Temp (24hrs), Avg:98.1 F (36.7 C), Min:97.7 F (36.5 C), Max:98.3 F (36.8 C)  Recent Labs  Lab 09/24/22 1555 09/24/22 1604 09/24/22 1742 09/25/22 0202 09/26/22 0225  WBC 7.6  --   --  7.0 4.7  CREATININE 1.48* 1.50*  --  1.18 1.27*  LATICACIDVEN  --   --  1.6  --   --     Estimated Creatinine Clearance: 72.1 mL/min (A) (by C-G formula based on SCr of 1.27 mg/dL (H)).    Allergies  Allergen Reactions   Celecoxib Anaphylaxis, Swelling and Rash    Tongue swells   Hydrocodone Rash and Other (See Comments)    "blisters developed on arms"    Sulfa Antibiotics Rash   Sulfacetamide Sodium Rash     Merla Riches 09/26/2022 9:34 AM

## 2022-09-26 NOTE — Consult Note (Signed)
WOC Nurse Consult Note: Reason for Consult:right buttock with chronic, recurring cyst/abscess, currently draining Wound type:infectious Pressure Injury POA:N/A Measurement:1cm round central area of draining abscess surrounded by 5cm circle of erythema Wound bed:With yellow edge of cyst lining protruding slightly from wound Drainage (amount, consistency, odor) small purulent Periwound:As described above Dressing procedure/placement/frequency: I have communicated via Secure Chat this morning with Dr. Thedore Mins, recommending a surgical consult for excision of the cyst/abscess rather than periodic draining. In the interim, I have provided conservative care orders for nursing using a daily soap and water cleanse followed by application of a silver hydrofiber secured with a dry dressing. As noted above, this will contain exudate, but not address the recurrent infectious issue.  WOC nursing team will not follow, but will remain available to this patient, the nursing and medical teams.  Please re-consult if needed.  Thank you for inviting [place] to participate in this patient's Plan of Care.  Ladona Mow, MSN, RN, CNS, GNP, Leda Min, [employer], [employer] phone:  [phone]

## 2022-09-26 NOTE — Plan of Care (Signed)
  Problem: Education: Goal: Knowledge of disease or condition will improve Outcome: Progressing   Problem: Coping: Goal: Will verbalize positive feelings about self Outcome: Progressing Goal: Will identify appropriate support needs Outcome: Progressing   Problem: Health Behavior/Discharge Planning: Goal: Goals will be collaboratively established with patient/family Outcome: Progressing   Problem: Nutrition: Goal: Risk of aspiration will decrease Outcome: Progressing   Problem: Clinical Measurements: Goal: Ability to maintain clinical measurements within normal limits will improve Outcome: Progressing Goal: Respiratory complications will improve Outcome: Progressing

## 2022-09-26 NOTE — Evaluation (Signed)
Speech Language Pathology Evaluation Patient Details Name: Vincent [name] MRN: [id number] DOB: 12-28-1965 Today's Date: 09/26/2022 Time: 2952-8413 SLP Time Calculation (min) (ACUTE ONLY): 13 min  Problem List:  Patient Active Problem List   Diagnosis Date Noted   Sepsis with acute renal failure (HCC) 09/25/2022   Abscess, gluteal, right 09/25/2022   Peripheral neuropathy in hands 06/08/2022   Essential hypertension 05/23/2022   BPH (benign prostatic hyperplasia) 05/23/2022   AKI (acute kidney injury) (HCC) 09/04/2021   Hypotension 09/03/2021   History of seizure 08/02/2021   Cerebral thrombosis with cerebral infarction 11/16/2020   Ischemic stroke (HCC) 06/10/2020   Pain due to onychomycosis of toenails of both feet 01/28/2020   Hyperglycemia due to type 2 diabetes mellitus (HCC) 01/07/2020   Resistance to insulin 01/07/2020   Right leg weakness 04/24/2019   Right leg pain 04/24/2019   Gait abnormality 04/24/2019   TIA (transient ischemic attack) 10/20/2018   DM2 (diabetes mellitus, type 2) (HCC) 10/19/2018   Left sided numbness 10/19/2018   Diabetic autonomic neuropathy associated with type 2 diabetes mellitus (HCC) 02/01/2018   DM type 2 with diabetic peripheral neuropathy (HCC) 02/01/2018   Dyslipidemia 01/31/2018   Coronary artery disease involving native coronary artery of native heart without angina pectoris 01/31/2018   Chest pain 01/01/2018   Breakthrough seizure (HCC) 12/19/2016   Essential tremor 12/19/2016   Chronic post-traumatic stress disorder (PTSD) 06/16/2016   Tremor 06/16/2016   Tobacco abuse 05/05/2016   Altered mental status 05/28/2015   Type 2 diabetes mellitus with hyperglycemia (HCC) 05/28/2015   Hypothyroidism 05/28/2015   Bipolar 2 disorder (HCC) 05/28/2015   History of rheumatoid arthritis 05/28/2015   Chronic pain 05/28/2015   Bipolar II disorder, most recent episode major depressive (HCC) 03/18/2015   Carpal tunnel syndrome on left 02/10/2015    Carpal tunnel syndrome on right 02/10/2015   Diabetes (HCC) 01/23/2015   Obesity (BMI 30-39.9) 03/06/2014   Acute encephalopathy 03/05/2014   Hyperlipidemia 03/05/2014   Anemia, normocytic normochromic 03/05/2014   Altered mental state    Helicobacter pylori (H. pylori) infection 11/20/2012   Protein-calorie malnutrition, severe (HCC) 10/09/2012   Dysphagia 08/19/2012   Gastroparesis 08/19/2012   Bladder tumor 08/08/2012   Biliary dyskinesia 11/02/2010   Past Medical History:  Past Medical History:  Diagnosis Date   Anxiety    Benign essential tremor    Bipolar 2 disorder (HCC)    followed by [facility]--- dr s. Lolly Mustache   Bladder cancer [facility])    recurrent   CAD (coronary artery disease)    cardiac cath 2003  and 2011 both showed normal coronary arteries w/ preserved lvf;  Non obstructive on CTA Oct 2019.    Chronic pain syndrome    back---- [facility] [place]   Cold extremities    BLE   COPD (chronic obstructive pulmonary disease) (HCC)    DDD (degenerative disc disease), lumbar    Diabetic peripheral neuropathy (HCC)    Gastroparesis    followed by dr Marina Goodell   GERD (gastroesophageal reflux disease)    Hiatal hernia    History of bladder cancer urologist-  previously dr Ronal Fear;  now dr gay   papillay TCC (Ta G1)  s/p TURBT and chemo instillation 2014   History of chest pain 12/2017   heart cath normal   History of encephalopathy 05/27/2015   admission w/ acute encephalopathy thought to be secondary to pain meds and COPD   History of gastric ulcer  History of Helicobacter pylori infection    History of kidney stones    History of TIA (transient ischemic attack) 2008  and 10-19-2018    no residual's   History of traumatic head injury 2010   w/ LOC  per pt needed stitches, hit in head with a mower blade   Hyperlipidemia    Hypertension    Hypogonadism male    s/p  bilateral orchiectomy   Hypothyroidism    Insomnia    Mild obstructive sleep apnea     study in epic 12-04-2016, no cpap   PTSD (post-traumatic stress disorder)    chronic   PTSD (post-traumatic stress disorder)    RA (rheumatoid arthritis) (HCC)    followed by [facility]   Seizures, transient [facility]) neurologist-  dr Terrace Arabia--  differential dx complex partial seizure .vs.  mood disorder .vs.  pseudoseizure--  negative EEG's   confusion episodes and staring spells since 11/ 2015   (03-26-2020 per pt wife last seizure 10 /2021)   Suicide attempt by drug overdose (HCC) 07/28/2021   Transient confusion NEUOROLOGIST-  DR Terrace Arabia   Episodes since 11/ 2015--  neurologist dx  differential complex partial seizure  .vs. mood disorder . vs. pseudoseizure   Type 2 diabetes mellitus treated with insulin [facility])    endocrinologist--- dr Everardo All---  (03-26-2020 pt does not check blood sugar at home)   Past Surgical History:  Past Surgical History:  Procedure Laterality Date   AMPUTATION Left 04/28/2020   Procedure: LEFT LITTLE FINGER AMPUTATION;  Surgeon: Nadara Mustard, MD;  Location: [facility] OR;  Service: Orthopedics;  Laterality: Left;   CARDIAC CATHETERIZATION  12-27-2001  DR Jacinto Halim  &  05-26-2009  DR Eldridge Dace   RESULTS FOR BOTH ARE NORMAL CORONARIES AND PERSERVED LVF/ EF 60%   CARPAL TUNNEL RELEASE Bilateral right 09-16-2003;  left ?   CARPAL TUNNEL RELEASE Left 02/25/2015   Procedure: LEFT CARPAL TUNNEL RELEASE;  Surgeon: Betha Loa, MD;  Location: [facility];  Service: Orthopedics;  Laterality: Left;   CYSTOSCOPY N/A 10/10/2012   Procedure: CYSTOSCOPY CLOT EVACUATION FULGERATION OF BLEEDERS ;  Surgeon: Garnett Farm, MD;  Location: [facility];  Service: Urology;  Laterality: N/A;   CYSTOSCOPY WITH BIOPSY N/A 11/26/2015   Procedure: CYSTOSCOPY WITH BIOPSY AND FULGURATION;  Surgeon: Ihor Gully, MD;  Location: [facility];  Service: Urology;  Laterality: N/A;   ESOPHAGOGASTRODUODENOSCOPY  2014   LAPAROSCOPIC CHOLECYSTECTOMY  11-17-2010    ORCHIECTOMY Right 02/21/2016   Procedure: SCROTAL ORCHIECTOMY with TESTICULAR PROSTHESIS IMPLANT;  Surgeon: Ihor Gully, MD;  Location: [facility];  Service: Urology;  Laterality: Right;   ORCHIECTOMY Left 09/02/2018   Procedure: ORCHIECTOMY;  Surgeon: Ihor Gully, MD;  Location: [facility];  Service: Urology;  Laterality: Left;   ROTATOR CUFF REPAIR Right 12/2004   TRANSURETHRAL RESECTION OF BLADDER TUMOR N/A 08/09/2012   Procedure: TRANSURETHRAL RESECTION OF BLADDER TUMOR (TURBT) WITH GYRUS WITH MITOMYCIN C;  Surgeon: Garnett Farm, MD;  Location: [facility];  Service: Urology;  Laterality: N/A;   TRANSURETHRAL RESECTION OF BLADDER TUMOR N/A 03/29/2020   Procedure: TRANSURETHRAL RESECTION OF BLADDER TUMOR (TURBT) and post-op instillation of gemcitabine;  Surgeon: [name], MD;  Location: [facility];  Service: Urology;  Laterality: N/A;   TRANSURETHRAL RESECTION OF BLADDER TUMOR WITH GYRUS (TURBT-GYRUS) N/A 02/27/2014   Procedure: TRANSURETHRAL RESECTION OF BLADDER TUMOR WITH GYRUS (TURBT-GYRUS);  Surgeon: Garnett Farm,  MD;  Location: [facility];  Service: Urology;  Laterality: N/A;   HPI:  57 y.o. male presents to [facility] hospital on 09/24/2022 with AMS, found down by family with L weakness, facial droop and aphasia. CT and MRI negative for acute findings though neurology still suspects acute infarct. PMHx: DM, peripheral neuropathy, GAD, chronic pain syndrome, bipolar 2 disorder, PTSD, RA, CAD, COPD, GERD, h/o bladder cancer and gastroparesis.   Assessment / Plan / Recommendation Clinical Impression  Pt participated in speech-language-cognitive assessment. He lives with his sister who manages his meds/pill box and he is in charge of finances. His language is within normal limits able to comprehend and express himslef without difficulties. Pt spoke with 100% intelligibility and normal oromotor exam. He was given the  SLUMS however points were totaled out of 26 due to inability to hold pen for clock drawing. He scored a 22/26 placing him in the mild impairment category. Missed one point for working memory, one point for stating digits backwards and one for mental calculations. Pt feels like he was confused on admission but is back to baseline. He states his memory has "never been good". Discussed ways to facilitate memory such as writing or since he has difficulty writing at times using talk to text on his phone. No further ST is needed in the acute venue or after discharge at this time and he has assistance of his sister.    SLP Assessment  SLP Recommendation/Assessment: Patient does not need any further Speech Lanaguage Pathology Services SLP Visit Diagnosis: Cognitive communication deficit (R41.841)    Recommendations for follow up therapy are one component of a multi-disciplinary discharge planning process, led by the attending physician.  Recommendations may be updated based on patient status, additional functional criteria and insurance authorization.    Follow Up Recommendations  No SLP follow up    Assistance Recommended at Discharge  None  Functional Status Assessment Patient has not had a recent decline in their functional status  Frequency and Duration           SLP Evaluation Cognition  Overall Cognitive Status: Within Functional Limits for tasks assessed Arousal/Alertness: Awake/alert Orientation Level: Oriented to time;Oriented to person Year: 2024 Day of Week: Correct Attention: Sustained Sustained Attention: Appears intact Memory: Impaired (4/5 words) Memory Impairment: Retrieval deficit Awareness: Appears intact Problem Solving: Appears intact Safety/Judgment: Appears intact       Comprehension  Auditory Comprehension Overall Auditory Comprehension: Appears within functional limits for tasks assessed Visual Recognition/Discrimination Discrimination: Not tested Reading  Comprehension Reading Status: Not tested    Expression Expression Primary Mode of Expression: Verbal Verbal Expression Overall Verbal Expression: Appears within functional limits for tasks assessed Initiation: No impairment Level of Generative/Spontaneous Verbalization: Conversation Repetition:  (NT) Naming: No impairment Pragmatics: No impairment Written Expression Dominant Hand: Left Written Expression: Not tested   Oral / Motor  Oral Motor/Sensory Function Overall Oral Motor/Sensory Function: Within functional limits Motor Speech Overall Motor Speech: Appears within functional limits for tasks assessed Respiration: Within functional limits Phonation: Normal Resonance: Within functional limits Articulation: Within functional limitis Intelligibility: Intelligible Motor Planning: Witnin functional limits Motor Speech Errors: Not applicable            Royce Macadamia 09/26/2022, 9:54 AM

## 2022-09-26 NOTE — Progress Notes (Addendum)
PROGRESS NOTE                                                                                                                                                                                                             Patient Demographics:    Vincent [name], is a 57 y.o. male, DOB - 10-26-1965, NFA:[id number]  Outpatient Primary MD for the patient is Dois Davenport, MD    LOS - 2  Admit date - 09/24/2022    Chief Complaint  Patient presents with   Code Stroke       Brief Narrative (HPI from H&P)   57 y.o. male with medical history significant of anxiety, benign essential tremor, bipolar disorder type II, bladder cancer, CAD, chronic pain, COPD, uncontrolled diabetes, diabetic peripheral neuropathy, GERD and gastroparesis presents to the ED with altered mental status.  He had extensive workup suggestive of right gluteal cleft abscess, he was admitted, I&D was done in the ER on 09/24/2022 by ER MD, neurosurgery was consulted, he was admitted by hospitalist service.  He had some nonspecific left-sided weakness and he was also seen by neurology and had an unremarkable neurological workup for that.   Subjective:    Vincent [name] today has, No headache, No chest pain, No abdominal pain - No Nausea, No new weakness tingling or numbness, left-sided weakness much improved, gluteal pain improved, no SOB,   Assessment  & Plan :    Acute metabolic encephalopathy due to sepsis from right gluteal cleft abscess.  CT head, CTA head and neck, CT abdomen pelvis and echocardiogram noted and stable, he underwent incision and drainage in the ER by the ER MD on 09/24/2022, sepsis pathophysiology has resolved mental status back to normal.  Continue empiric IV antibiotics, follow blood cultures, check MRSA nasal PCR, general surgery to evaluate soon.  Hypotension with encephalopathy, sepsis and some questionable left-sided weakness.  Likely all due to  sepsis and shock, mentation back to normal, no focal deficits, CT head and CTA head and neck unremarkable, RI C-spine unremarkable.  Stable neurowork-up.  Mentation back to normal strength near normal now.  Seen by neurology.  AKI due to sepsis on underlying CKD 3A.  Baseline creatinine around 1.4.  AKI due to sepsis now resolved.  Dyslipidemia.  On statin.    History of CVA.  Plavix and statin  for secondary prevention.    Hypertension.  Placed on Coreg, Norvasc and IV hydralazine as needed, monitor and adjust.  DM type II.  On Lantus and sliding scale.  Monitor and adjust.  Lab Results  Component Value Date   HGBA1C 12.4 (H) 09/25/2022   CBG (last 3)  Recent Labs    09/25/22 1604 09/25/22 2114 09/26/22 0749  GLUCAP 227* 266* 249*         Condition - Fair  Family Communication  :  None  Code Status :  Full  Consults  :  CCS, Neuro  PUD Prophylaxis : PPI   Procedures  :     TTE Bubble - 1. Left ventricular ejection fraction, by estimation, is 60 to 65%. The left ventricle has normal function. The left ventricle has no regional wall motion abnormalities. There is mild concentric left ventricular hypertrophy. Left ventricular diastolic parameters were normal.  2. Right ventricular systolic function is normal. The right ventricular size is normal.  3. The mitral valve is normal in structure. Trivial mitral valve regurgitation. No evidence of mitral stenosis.  4. The aortic valve is normal in structure. Aortic valve regurgitation is not visualized. No aortic stenosis is present.  5. The inferior vena cava is normal in size with greater than 50% respiratory variability, suggesting right atrial pressure of 3 mmHg.   MR C Spine - : 1. No evidence of discitis osteomyelitis or epidural abscess. 2. No spinal canal stenosis or neural foraminal narrowing.  CT head.  Nonacute.    CTA head and neck.   1. No significant proximal stenosis, aneurysm, or branch vessel occlusion within the  Circle of Willis. 2. Stable atherosclerotic calcifications within the cavernous internal carotid arteries without significant stenosis. 3. Stable atherosclerotic changes within the cavernous internal carotid arteries bilaterally without significant stenosis relative to the more distal vessels. 4.  Aortic Atherosclerosis (ICD10-I70.0)   CT chest abdomen pelvis with contrast- : 1. No acute aortic syndrome. 2. Subcutaneous peripherally enhancing gas and fluid collection along the right posterior gluteal cleft measuring 3.5 x 2.3 cm, concerning for abscess. 3. Diffuse bronchial wall thickening. Layering secretions in the trachea near the thoracic inlet. Query aspiration. 4. Hepatic steatosis. 5. Moderate stool load.  Correlate for constipation. Aortic Atherosclerosis (ICD10-I70.0).      Disposition Plan  :    Status is: Inpatient  DVT Prophylaxis  :    heparin injection 5,000 Units Start: 09/25/22 2200 SCD's Start: 09/24/22 2035 SCDs Start: 09/24/22 2031    Lab Results  Component Value Date   PLT 264 09/26/2022    Diet :  Diet Order             Diet Carb Modified Fluid consistency: Thin; Room service appropriate? Yes  Diet effective now                    Inpatient Medications  Scheduled Meds:  amLODipine  5 mg Oral Daily   carvedilol  6.25 mg Oral BID WC   clopidogrel  75 mg Oral Daily   DULoxetine  60 mg Oral Daily   ezetimibe  10 mg Oral Daily   heparin injection (subcutaneous)  5,000 Units Subcutaneous Q8H   hydroxychloroquine  200 mg Oral BID   insulin aspart  0-15 Units Subcutaneous TID WC   insulin glargine-yfgn  10 Units Subcutaneous Daily   lamoTRIgine  150 mg Oral BID   lurasidone  40 mg Oral QHS   pantoprazole  40 mg Oral  Daily   pregabalin  200 mg Oral TID   rosuvastatin  20 mg Oral QHS   sodium chloride flush  3 mL Intravenous Once   tamsulosin  0.4 mg Oral QHS   venlafaxine XR  150 mg Oral Q breakfast   Continuous Infusions:  ceFEPime (MAXIPIME) IV 2  g (09/26/22 0828)   vancomycin 1,750 mg (09/26/22 0004)   PRN Meds:.acetaminophen, hydrALAZINE   Objective:   Vitals:   09/25/22 2200 09/25/22 2300 09/26/22 0300 09/26/22 0751  BP:  (!) 176/84 (!) 189/85 (!) 187/99  Pulse:  87 79 83  Resp: 15 19 18 20   Temp:  98.3 F (36.8 C) 98 F (36.7 C) 98.2 F (36.8 C)  TempSrc:  Axillary Oral Oral  SpO2:  97% 97% 98%  Weight:      Height:        Wt Readings from Last 3 Encounters:  09/24/22 89.4 kg  08/25/22 80.7 kg  08/11/22 79.4 kg     Intake/Output Summary (Last 24 hours) at 09/26/2022 0912 Last data filed at 09/26/2022 0500 Gross per 24 hour  Intake 964.5 ml  Output 1500 ml  Net -535.5 ml     Physical Exam  Awake Alert, No new F.N deficits, Normal affect [place].AT,PERRAL Supple Neck, No JVD,   Symmetrical Chest wall movement, Good air movement bilaterally, CTAB RRR,No Gallops,Rubs or new Murmurs,  +ve B.Sounds, Abd Soft, No tenderness,   No Cyanosis, Clubbing or edema     Data Review:    Recent Labs  Lab 09/24/22 1555 09/24/22 1604 09/25/22 0202 09/26/22 0225  WBC 7.6  --  7.0 4.7  HGB 12.6* 11.6* 11.2* 11.2*  HCT 37.4* 34.0* 33.8* 33.1*  PLT 328  --  247 264  MCV 79.7*  --  83.9 83.2  MCH 26.9  --  27.8 28.1  MCHC 33.7  --  33.1 33.8  RDW 13.7  --  14.1 13.9  LYMPHSABS 3.0  --   --  1.4  MONOABS 0.7  --   --  0.3  EOSABS 0.1  --   --  0.1  BASOSABS 0.1  --   --  0.0    Recent Labs  Lab 09/24/22 1555 09/24/22 1604 09/24/22 1742 09/25/22 0002 09/25/22 0202 09/26/22 0225 09/26/22 0700  NA 139 139  --   --  136 133*  --   K 4.0 4.0  --   --  4.1 4.1  --   CL 105 108  --   --  105 104  --   CO2 20*  --   --   --  20* 19*  --   ANIONGAP 14  --   --   --  11 10  --   GLUCOSE 127* 126*  --   --  152* 294*  --   BUN 20 24*  --   --  21* 21*  --   CREATININE 1.48* 1.50*  --   --  1.18 1.27*  --   AST 20  --   --   --  17 15  --   ALT 16  --   --   --  17 18  --   ALKPHOS 72  --   --   --  65 75  --    BILITOT 0.6  --   --   --  0.7 0.6  --   ALBUMIN 3.2*  --   --   --  2.7* 2.7*  --  CRP  --   --   --   --   --   --  0.9  LATICACIDVEN  --   --  1.6  --   --   --   --   INR 0.9  --   --   --   --   --   --   TSH  --   --   --   --  2.731  --  4.459  HGBA1C  --   --   --  12.4*  --   --   --   BNP  --   --   --   --   --   --  60.1  CALCIUM 9.3  --   --   --  8.7* 8.5*  --       Recent Labs  Lab 09/24/22 1555 09/24/22 1742 09/25/22 0002 09/25/22 0202 09/26/22 0225 09/26/22 0700  CRP  --   --   --   --   --  0.9  LATICACIDVEN  --  1.6  --   --   --   --   INR 0.9  --   --   --   --   --   TSH  --   --   --  2.731  --  4.459  HGBA1C  --   --  12.4*  --   --   --   BNP  --   --   --   --   --  60.1  CALCIUM 9.3  --   --  8.7* 8.5*  --     Lab Results  Component Value Date   CHOL 265 (H) 09/26/2022   HDL 42 09/26/2022   LDLCALC UNABLE TO CALCULATE IF TRIGLYCERIDE OVER 400 mg/dL 81/19/1478   LDLDIRECT 120.6 (H) 08/03/2021   TRIG 564 (H) 09/26/2022   CHOLHDL 6.3 09/26/2022    Lab Results  Component Value Date   HGBA1C 12.4 (H) 09/25/2022      Micro Results Recent Results (from the past 240 hour(s))  Blood culture (routine x 2)     Status: None (Preliminary result)   Collection Time: 09/24/22  6:18 PM   Specimen: BLOOD LEFT HAND  Result Value Ref Range Status   Specimen Description BLOOD LEFT HAND  Final   Special Requests   Final    BOTTLES DRAWN AEROBIC AND ANAEROBIC Blood Culture results may not be optimal due to an inadequate volume of blood received in culture bottles   Culture   Final    NO GROWTH 2 DAYS Performed at [facility], [address], [place], [place] [address]    Report Status PENDING  Incomplete  Blood culture (routine x 2)     Status: None (Preliminary result)   Collection Time: 09/24/22  6:34 PM   Specimen: BLOOD LEFT ARM  Result Value Ref Range Status   Specimen Description BLOOD LEFT ARM  Final   Special Requests   Final     BOTTLES DRAWN AEROBIC AND ANAEROBIC Blood Culture results may not be optimal due to an inadequate volume of blood received in culture bottles   Culture   Final    NO GROWTH 2 DAYS Performed at [facility], [address], [place], [place] [address]    Report Status PENDING  Incomplete    Radiology Reports ECHOCARDIOGRAM COMPLETE BUBBLE STUDY  Result Date: 09/25/2022    ECHOCARDIOGRAM REPORT  Patient Name:   Vincent [name] [facility] Date of Exam: 09/25/2022 Medical Rec #:  [id number]       Height:       72.0 in Accession #:    [id number]      Weight:       197.1 lb Date of Birth:  [birth year]        BSA:          2.117 m Patient Age:    57 years        BP:           154/79 mmHg Patient Gender: M               HR:           85 bpm. Exam Location:  Inpatient Procedure: 2D Echo, Cardiac Doppler, Color Doppler and Saline Contrast Bubble            Study Indications:    Stroke I63.9  History:        Patient has prior history of Echocardiogram examinations, most                 recent 07/19/2022. Risk Factors:Hypertension, Diabetes,                 Dyslipidemia and Current Smoker.  Sonographer:    Dondra Prader RVT RCS Referring Phys: [id number] CORTNEY E DE [place]OR[place] IMPRESSIONS  1. Left ventricular ejection fraction, by estimation, is 60 to 65%. The left ventricle has normal function. The left ventricle has no regional wall motion abnormalities. There is mild concentric left ventricular hypertrophy. Left ventricular diastolic parameters were normal.  2. Right ventricular systolic function is normal. The right ventricular size is normal.  3. The mitral valve is normal in structure. Trivial mitral valve regurgitation. No evidence of mitral stenosis.  4. The aortic valve is normal in structure. Aortic valve regurgitation is not visualized. No aortic stenosis is present.  5. The inferior vena cava is normal in size with greater than 50% respiratory variability, suggesting right atrial pressure of 3 mmHg. FINDINGS  Left  Ventricle: Left ventricular ejection fraction, by estimation, is 60 to 65%. The left ventricle has normal function. The left ventricle has no regional wall motion abnormalities. The left ventricular internal cavity size was normal in size. There is  mild concentric left ventricular hypertrophy. Left ventricular diastolic parameters were normal. Right Ventricle: The right ventricular size is normal. No increase in right ventricular wall thickness. Right ventricular systolic function is normal. Left Atrium: Left atrial size was normal in size. Right Atrium: Right atrial size was normal in size. Pericardium: There is no evidence of pericardial effusion. Mitral Valve: The mitral valve is normal in structure. Trivial mitral valve regurgitation. No evidence of mitral valve stenosis. Tricuspid Valve: The tricuspid valve is normal in structure. Tricuspid valve regurgitation is trivial. No evidence of tricuspid stenosis. Aortic Valve: The aortic valve is normal in structure. Aortic valve regurgitation is not visualized. No aortic stenosis is present. Aortic valve mean gradient measures 4.0 mmHg. Aortic valve peak gradient measures 7.1 mmHg. Aortic valve area, by VTI measures 2.98 cm. Pulmonic Valve: The pulmonic valve was normal in structure. Pulmonic valve regurgitation is trivial. No evidence of pulmonic stenosis. Aorta: The aortic root is normal in size and structure. Venous: The inferior vena cava is normal in size with greater than 50% respiratory variability, suggesting right atrial pressure of 3 mmHg. IAS/Shunts: No atrial level shunt detected by color flow Doppler. Agitated saline  contrast was given intravenously to evaluate for intracardiac shunting.  LEFT VENTRICLE PLAX 2D LVIDd:         4.90 cm   Diastology LVIDs:         3.20 cm   LV e' medial:    8.16 cm/s LV PW:         1.00 cm   LV E/e' medial:  10.7 LV IVS:        1.10 cm   LV e' lateral:   12.40 cm/s LVOT diam:     2.20 cm   LV E/e' lateral: 7.1 LV SV:          80 LV SV Index:   38 LVOT Area:     3.80 cm  RIGHT VENTRICLE             IVC RV S prime:     14.40 cm/s  IVC diam: 2.20 cm TAPSE (M-mode): 2.8 cm LEFT ATRIUM             Index        RIGHT ATRIUM           Index LA diam:        3.60 cm 1.70 cm/m   RA Area:     10.10 cm LA Vol (A2C):   55.7 ml 26.31 ml/m  RA Volume:   16.60 ml  7.84 ml/m LA Vol (A4C):   41.7 ml 19.69 ml/m LA Biplane Vol: 52.7 ml 24.89 ml/m  AORTIC VALVE                    PULMONIC VALVE AV Area (Vmax):    2.97 cm     PV Vmax:       1.04 m/s AV Area (Vmean):   3.02 cm     PV Peak grad:  4.3 mmHg AV Area (VTI):     2.98 cm AV Vmax:           133.00 cm/s AV Vmean:          89.800 cm/s AV VTI:            0.268 m AV Peak Grad:      7.1 mmHg AV Mean Grad:      4.0 mmHg LVOT Vmax:         104.00 cm/s LVOT Vmean:        71.300 cm/s LVOT VTI:          0.210 m LVOT/AV VTI ratio: 0.78  AORTA Ao Root diam: 3.50 cm Ao Asc diam:  3.50 cm MITRAL VALVE MV Area (PHT): 3.99 cm     SHUNTS MV Decel Time: 190 msec     Systemic VTI:  0.21 m MV E velocity: 87.50 cm/s   Systemic Diam: 2.20 cm MV A velocity: 110.00 cm/s MV E/A ratio:  0.80 Arvilla Meres MD Electronically signed by Arvilla Meres MD Signature Date/Time: 09/25/2022/5:26:30 PM    Final    MR CERVICAL SPINE W WO CONTRAST  Result Date: 09/25/2022 CLINICAL DATA:  Myelopathy, rule out abscess EXAM: MRI CERVICAL SPINE WITHOUT AND WITH CONTRAST TECHNIQUE: Multiplanar and multiecho pulse sequences of the cervical spine, to include the craniocervical junction and cervicothoracic junction, were obtained without and with intravenous contrast. CONTRAST:  9mL GADAVIST GADOBUTROL 1 MMOL/ML IV SOLN COMPARISON:  05/25/2022 FINDINGS: Evaluation is somewhat limited by motion artifact. Alignment: No significant listhesis. Vertebrae: No acute fracture, evidence of discitis, or suspicious osseous lesion. No abnormal enhancement. Cord: Normal signal  and morphology.  No abnormal enhancement. Posterior Fossa,  vertebral arteries, paraspinal tissues: Negative. Disc levels: C2-C3: No significant disc bulge. No spinal canal stenosis or neuroforaminal narrowing. C3-C4: No significant disc bulge. Facet arthropathy, with fused left facets that are better seen on the prior exam. No spinal canal stenosis or neural foraminal narrowing. C4-C5: No significant disc bulge. No spinal canal stenosis or neuroforaminal narrowing. C5-C6: Mild disc bulge, which indents the ventral thecal sac but does not deform the spinal cord. No spinal canal stenosis or neural foraminal narrowing. C6-C7: Minimal disc bulge. Facet and uncovertebral hypertrophy. No spinal canal stenosis. No neural foraminal narrowing. C7-T1: No significant disc bulge. No spinal canal stenosis or neuroforaminal narrowing. IMPRESSION: 1. No evidence of discitis osteomyelitis or epidural abscess. 2. No spinal canal stenosis or neural foraminal narrowing. Electronically Signed   By: Wiliam Ke M.D.   On: 09/25/2022 03:25   DG Chest 2 View  Result Date: 09/25/2022 CLINICAL DATA:  Stroke EXAM: CHEST - 2 VIEW COMPARISON:  Radiograph 08/25/2022 and CT 09/24/2022 FINDINGS: Stable cardiomediastinal silhouette. Aortic atherosclerotic calcification. Low lung volumes accentuated pulmonary vascularity. No focal consolidation, pleural effusion, or pneumothorax. No displaced rib fractures. IMPRESSION: Low lung volumes without acute cardiopulmonary process. Electronically Signed   By: Minerva Fester M.D.   On: 09/25/2022 00:31   MR BRAIN WO CONTRAST  Result Date: 09/24/2022 CLINICAL DATA:  Headache, neuro deficit EXAM: MRI HEAD WITHOUT CONTRAST TECHNIQUE: Multiplanar, multiecho pulse sequences of the brain and surrounding structures were obtained without intravenous contrast. COMPARISON:  08/27/2022 MRI head, correlation is also made with 09/24/2022 CT head FINDINGS: Evaluation is somewhat limited by motion artifact. Brain: No restricted diffusion to suggest acute or subacute  infarct. No acute hemorrhage, mass, mass effect, or midline shift. No hydrocephalus or extra-axial collection. Normal pituitary and craniocervical junction. No hemosiderin deposition to suggest remote hemorrhage. Normal cerebral volume for age. Vascular: Normal arterial flow voids. Skull and upper cervical spine: Normal marrow signal. Sinuses/Orbits: Clear paranasal sinuses. No acute finding in the orbits. Other: The mastoid air cells are well aerated. IMPRESSION: No acute intracranial process. No evidence of acute or subacute infarct. Electronically Signed   By: Wiliam Ke M.D.   On: 09/24/2022 22:59   CT ANGIO HEAD NECK W WO CM  Result Date: 09/24/2022 CLINICAL DATA:  Neuro deficit, acute, stroke suspected. EXAM: CT ANGIOGRAPHY HEAD AND NECK WITH AND WITHOUT CONTRAST TECHNIQUE: Multidetector CT imaging of the head and neck was performed using the standard protocol during bolus administration of intravenous contrast. Multiplanar CT image reconstructions and MIPs were obtained to evaluate the vascular anatomy. Carotid stenosis measurements (when applicable) are obtained utilizing NASCET criteria, using the distal internal carotid diameter as the denominator. RADIATION DOSE REDUCTION: This exam was performed according to the departmental dose-optimization program which includes automated exposure control, adjustment of the mA and/or kV according to patient size and/or use of iterative reconstruction technique. CONTRAST:  OMNIPAQUE IOHEXOL 350 MG/ML SOLN COMPARISON:  CT angio head and neck 06/10/2020 FINDINGS: CTA NECK FINDINGS Aortic arch: Atherosclerotic calcifications are present at the aortic arch and great vessel origins. No significant stenosis of greater than 50% is present relative to the more distal vessels. Right carotid system: Atherosclerotic changes are noted along the right common carotid artery without significant stenosis. Calcifications are present at the aortic arch and proximal right  ICA without significant stenosis. The more distal right ICA is normal. No significant interval change is present. Left carotid system: The left common carotid  artery demonstrates atherosclerotic changes without significant stenosis. Calcifications are present at the aortic arch and proximal left ICA without a significant stenosis relative to the more distal vessel. Vertebral arteries: The right vertebral artery is the dominant vessel. Atherosclerotic calcifications are present in the V1 segment. No significant stenosis is present in either vertebral artery in the neck. Skeleton: No acute abnormalities are present. No focal osseous lesions are present. The patient is edentulous. Other neck: Soft tissues the neck are otherwise unremarkable. Salivary glands are within normal limits. Thyroid is normal. No significant adenopathy is present. No focal mucosal or submucosal lesions are present. Upper chest: The lung apices are clear. The thoracic inlet is within normal limits. Review of the MIP images confirms the above findings CTA HEAD FINDINGS Anterior circulation: Atherosclerotic calcifications are present within the cavernous internal carotid arteries bilaterally without a significant stenosis relative to the more distal vessels. The A1 and M1 segments are normal. The anterior communicating artery is patent. MCA bifurcations are within normal limits. ACA and MCA branch vessels are normal. No aneurysm is present. Posterior circulation: The right vertebral artery is the dominant vessel. PICA origin is visualized and normal. Scratched at the PICA origins are visualized and normal. The vertebrobasilar junction and basilar artery normal. Both posterior cerebral arteries scratched at the superior cerebellar arteries are patent. Both posterior cerebral arteries originate from the basilar tip. The PCA branch vessels are normal bilaterally. Venous sinuses: The dural sinuses are patent. The straight sinus and deep cerebral veins  are intact. Cortical veins are within normal limits. No significant vascular malformation is evident. Review of the MIP images confirms the above findings IMPRESSION: 1. No significant proximal stenosis, aneurysm, or branch vessel occlusion within the Circle of Willis. 2. Stable atherosclerotic calcifications within the cavernous internal carotid arteries without significant stenosis. 3. Stable atherosclerotic changes within the cavernous internal carotid arteries bilaterally without significant stenosis relative to the more distal vessels. 4.  Aortic Atherosclerosis (ICD10-I70.0). Electronically Signed   By: Marin Roberts M.D.   On: 09/24/2022 17:35   CT Angio Chest Aorta W and/or Wo Contrast  Result Date: 09/24/2022 CLINICAL DATA:  Neuro deficit, acute aortic syndrome suspected EXAM: CT ANGIOGRAPHY CHEST, ABDOMEN AND PELVIS TECHNIQUE: Non-contrast CT of the chest was initially obtained. Multidetector CT imaging through the chest, abdomen and pelvis was performed using the standard protocol during bolus administration of intravenous contrast. Multiplanar reconstructed images and MIPs were obtained and reviewed to evaluate the vascular anatomy. RADIATION DOSE REDUCTION: This exam was performed according to the departmental dose-optimization program which includes automated exposure control, adjustment of the mA and/or kV according to patient size and/or use of iterative reconstruction technique. CONTRAST:  OMNIPAQUE IOHEXOL 350 MG/ML SOLN COMPARISON:  Chest radiograph 08/25/2022; CT abdomen and pelvis 06/21/2022; CTA chest abdomen and pelvis 06/10/2020 FINDINGS: CTA CHEST FINDINGS Cardiovascular: Normal heart size. No pericardial effusion. No acute aortic syndrome. Aortic atherosclerotic and coronary artery atherosclerotic calcification. Mediastinum/Nodes: Unremarkable esophagus. Secretions in the posterior trachea near the thoracic inlet. No thoracic adenopathy. Lungs/Pleura: Lungs are clear. No  pleural effusion or pneumothorax. Mild diffuse bronchial wall thickening. Mild mosaic attenuation of the lungs compatible with air trapping. Musculoskeletal: No chest wall abnormality. No acute or significant osseous findings. Review of the MIP images confirms the above findings. CTA ABDOMEN AND PELVIS FINDINGS VASCULAR Aorta: Moderate calcified plaque causes up to mild narrowing near the bifurcation. No aneurysm or dissection. Celiac: Patent without aneurysm or dissection. SMA: Patent without aneurysm or dissection. Renals: Calcified  plaque at the origin of the right renal artery causes moderate narrowing. Patent left renal artery. No aneurysm or dissection IMA: Patent. Inflow: Scattered calcified atherosclerotic plaque causing multifocal areas of mild narrowing. No aneurysm or dissection. Veins: Patent portal vein. Review of the MIP images confirms the above findings. NON-VASCULAR Hepatobiliary: Cholecystectomy. Hepatic steatosis. No biliary dilation. Pancreas: Unremarkable. No pancreatic ductal dilatation or surrounding inflammatory changes. Spleen: Normal in size without focal abnormality. Adrenals/Urinary Tract: Unremarkable adrenal glands and kidneys. No hydronephrosis or urinary calculi. Unremarkable bladder. Stomach/Bowel: Stomach is within normal limits. Normal caliber large and small bowel. Moderate colonic stool load. Normal appendix. Lymphatic: No lymphadenopathy. Reproductive: No acute abnormality. Other: Subcutaneous peripherally enhancing gas and fluid collection along the right posterior gluteal cleft measuring 3.5 x 2.3 cm (6/344). Mild adjacent stranding. No free intraperitoneal fluid or air. Musculoskeletal: No acute osseous abnormality. Review of the MIP images confirms the above findings. IMPRESSION: 1. No acute aortic syndrome. 2. Subcutaneous peripherally enhancing gas and fluid collection along the right posterior gluteal cleft measuring 3.5 x 2.3 cm, concerning for abscess. 3. Diffuse  bronchial wall thickening. Layering secretions in the trachea near the thoracic inlet. Query aspiration. 4. Hepatic steatosis. 5. Moderate stool load.  Correlate for constipation. Aortic Atherosclerosis (ICD10-I70.0). Electronically Signed   By: Minerva Fester M.D.   On: 09/24/2022 17:27   CT HEAD CODE STROKE WO CONTRAST  Result Date: 09/24/2022 CLINICAL DATA:  Code stroke. Neuro deficit, acute, stroke suspected. EXAM: CT HEAD WITHOUT CONTRAST TECHNIQUE: Contiguous axial images were obtained from the base of the skull through the vertex without intravenous contrast. RADIATION DOSE REDUCTION: This exam was performed according to the departmental dose-optimization program which includes automated exposure control, adjustment of the mA and/or kV according to patient size and/or use of iterative reconstruction technique. COMPARISON:  CT head without contrast 07/18/2022. MR head without contrast 08/27/2022 FINDINGS: Brain: No acute infarct, hemorrhage, or mass lesion is present. No significant white matter lesions are present. Deep brain nuclei are within normal limits. Insular ribbon is normal bilaterally. The ventricles are of normal size. No significant extraaxial fluid collection is present. The brainstem and cerebellum are within normal limits. Midline structures are within normal limits. Vascular: Atherosclerotic calcifications are present within the cavernous internal carotid arteries bilaterally. No hyperdense vessel is present. Skull: Calvarium is intact. No focal lytic or blastic lesions are present. No significant extracranial soft tissue lesion is present. Sinuses/Orbits: The paranasal sinuses and mastoid air cells are clear. The globes and orbits are within normal limits. ASPECTS [facility] Stroke Program Early CT Score) - Ganglionic level infarction (caudate, lentiform nuclei, internal capsule, insula, M1-M3 cortex): 7/7 - Supraganglionic infarction (M4-M6 cortex): 3/3 Total score (0-10 with 10 being  normal): 10/10 IMPRESSION: 1. Negative CT of the head. 2. Aspects is 10/10. The above was relayed via text pager to Dr. Otelia Limes on 09/24/2022 at 16:15 . Electronically Signed   By: Marin Roberts M.D.   On: 09/24/2022 16:17      Signature  -   Susa Raring M.D on 09/26/2022 at 9:12 AM   -  To page go to [email or web address]

## 2022-09-26 NOTE — Plan of Care (Signed)
  Problem: Education: Goal: Knowledge of disease or condition will improve Outcome: Progressing   Problem: Ischemic Stroke/TIA Tissue Perfusion: Goal: Complications of ischemic stroke/TIA will be minimized 09/26/2022 0532 by Crisoforo Oxford, RN Outcome: Progressing 09/26/2022 0530 by Crisoforo Oxford, RN Outcome: Not Progressing   Problem: Coping: Goal: Will verbalize positive feelings about self Outcome: Progressing Goal: Will identify appropriate support needs Outcome: Progressing   Problem: Health Behavior/Discharge Planning: Goal: Goals will be collaboratively established with patient/family 09/26/2022 0532 by Crisoforo Oxford, RN Outcome: Progressing 09/26/2022 0530 by Crisoforo Oxford, RN Outcome: Progressing   Problem: Nutrition: Goal: Risk of aspiration will decrease Outcome: Progressing   Problem: Clinical Measurements: Goal: Ability to maintain clinical measurements within normal limits will improve Outcome: Progressing Goal: Respiratory complications will improve Outcome: Progressing

## 2022-09-26 NOTE — Evaluation (Signed)
Occupational Therapy Evaluation Patient Details Name: Vincent [name] MRN: [id number] DOB: 1966/02/14 Today's Date: 09/26/2022   History of Present Illness 57 y.o. male presents to [facility] on 09/24/2022 with AMS, found down by family with L weakness, facial droop and aphasia. CT and MRI negative for acute findings though neurology still suspects acute infarct. PMHx: DM, peripheral neuropathy, GAD, chronic pain syndrome, bipolar 2 disorder, PTSD, RA, CAD, COPD, GERD, h/o bladder cancer and gastroparesis.   Clinical Impression   PTA, pt lives with sister, typically ambulatory with Rollator (reports falls have decreased in past 2 months) and able to manage majority of ADLs with Modified Independence. Pt presents now with minor deficits in strength (reports L UE improving significantly), standing balance and endurance. Overall, pt able to take steps around bedside and to/from chair with RW and min guard - improving confidence as session progressed. Pt requires Setup for UB ADL and no more than Min A for LB ADLs. Pt declining postacute rehab stay and hopeful to resume HH therapy services at [facility]. Recommend w/c for longer distance mobility and educated pt re: fall prevention w/ use of Rollator at home.     Recommendations for follow up therapy are one component of a multi-disciplinary discharge planning process, led by the attending physician.  Recommendations may be updated based on patient status, additional functional criteria and insurance authorization.   Assistance Recommended at Discharge Intermittent Supervision/Assistance  Patient can return home with the following A little help with bathing/dressing/bathroom;Assistance with cooking/housework;Assist for transportation;Help with stairs or ramp for entrance    Functional Status Assessment  Patient has had a recent decline in their functional status and demonstrates the ability to make significant improvements in function in a reasonable and  predictable amount of time.  Equipment Recommendations  Wheelchair cushion (measurements OT);Wheelchair (measurements OT)    Recommendations for Other Services       Precautions / Restrictions Precautions Precautions: Fall Restrictions Weight Bearing Restrictions: No      Mobility Bed Mobility Overal bed mobility: Needs Assistance Bed Mobility: Supine to Sit, Sit to Supine     Supine to sit: Min assist, HOB elevated Sit to supine: Supervision   General bed mobility comments: light Min A to lift trunk per pt request, reports having bedrails in different locations at home that assist with bed mob    Transfers Overall transfer level: Needs assistance Equipment used: Rolling walker (2 wheels) Transfers: Sit to/from Stand, Bed to chair/wheelchair/BSC Sit to Stand: Min guard, From elevated surface     Step pivot transfers: Min guard     General transfer comment: slightly elevated bed to stand with min guard. pt able to pivot to recliner with close min guard progressing to fading min guard on return to bed (declined to sit in recliner for breakfast due to chronic back pain). increased speed, confidence noted with pivot back to bed      Balance Overall balance assessment: Needs assistance Sitting-balance support: Feet supported, No upper extremity supported Sitting balance-Leahy Scale: Good     Standing balance support: Bilateral upper extremity supported, Reliant on assistive device for balance Standing balance-Leahy Scale: Poor                             ADL either performed or assessed with clinical judgement   ADL Overall ADL's : Needs assistance/impaired Eating/Feeding: Set up Eating/Feeding Details (indicate cue type and reason): assist to open containers; discussed adaptive strategies for  self feeding with tremors (cups with lids, straws, etc). pt using R hand to scoop with spoon Grooming: Min guard;Standing   Upper Body Bathing: Set up;Sitting    Lower Body Bathing: Minimal assistance;Sit to/from stand;Sitting/lateral leans   Upper Body Dressing : Set up;Sitting   Lower Body Dressing: Minimal assistance;Sitting/lateral leans;Sit to/from stand   Toilet Transfer: Min guard;Stand-pivot;BSC/3in1;Rolling walker (2 wheels)   Toileting- Architect and Hygiene: Min guard;Sitting/lateral lean;Sit to/from stand         General ADL Comments: Discussed self monitoring fatigue/feelings of weakness to implement seated rest break with Rollator at home, performing tasks seated and potential benefit of w/c for longer distances     Vision Ability to See in Adequate Light: 0 Adequate Patient Visual Report: No change from baseline Vision Assessment?: No apparent visual deficits     Perception     Praxis      Pertinent Vitals/Pain Pain Assessment Pain Assessment: No/denies pain     Hand Dominance Left   Extremity/Trunk Assessment Upper Extremity Assessment Upper Extremity Assessment: LUE deficits/detail LUE Deficits / Details: able to reach up and hold at 90*. functionally using phone, some difficulty pushing up from bed but able to hold to RW well. reports some essential tremors when attempting to complete tasks - often attempting to use R hand to compensate. baseline 5th digit amp LUE Coordination: decreased fine motor   Lower Extremity Assessment Lower Extremity Assessment: Defer to PT evaluation   Cervical / Trunk Assessment Cervical / Trunk Assessment: Normal   Communication Communication Communication: No difficulties   Cognition Arousal/Alertness: Awake/alert Behavior During Therapy: WFL for tasks assessed/performed Overall Cognitive Status: No family/caregiver present to determine baseline cognitive functioning                                 General Comments: pleasant, functional; shows insight into deficits and self monitoring of when rest break needed     General Comments        Exercises     Shoulder Instructions      Home Living Family/patient expects to be discharged to:: Private residence Living Arrangements: Other relatives (sister) Available Help at Discharge: Family Type of Home: Mobile home Home Access: Stairs to enter Entrance Stairs-Number of Steps: 5 Entrance Stairs-Rails: Right;Left Home Layout: One level     Bathroom Shower/Tub: Walk-in shower;Tub/shower unit   Bathroom Toilet: Handicapped height     Home Equipment: Rollator (4 wheels);Cane - single point;Shower seat   Additional Comments: Sister lives with pt but works full-time      Prior Functioning/Environment Prior Level of Function : History of Falls (last six months);Independent/Modified Independent             Mobility Comments: mod I with use of rollator at all times, reports significant fall history but that has decreased in recent 2 months per pt ADLs Comments: mod I with dressing and toileting. Sister assists with showering and IADLs        OT Problem List: Decreased strength;Decreased activity tolerance;Impaired balance (sitting and/or standing);Decreased coordination      OT Treatment/Interventions: Self-care/ADL training;Therapeutic exercise;Energy conservation;DME and/or AE instruction;Therapeutic activities;Patient/family education    OT Goals(Current goals can be found in the care plan section) Acute Rehab OT Goals Patient Stated Goal: continue HH services OT Goal Formulation: With patient Time For Goal Achievement: 10/10/22 Potential to Achieve Goals: Good  OT Frequency: Min 2X/week    Co-evaluation  AM-PAC OT "6 Clicks" Daily Activity     Outcome Measure Help from another person eating meals?: A Little Help from another person taking care of personal grooming?: A Little Help from another person toileting, which includes using toliet, bedpan, or urinal?: A Little Help from another person bathing (including washing, rinsing, drying)?:  A Little Help from another person to put on and taking off regular upper body clothing?: A Little Help from another person to put on and taking off regular lower body clothing?: A Little 6 Click Score: 18   End of Session Equipment Utilized During Treatment: Gait belt;Rolling walker (2 wheels) Nurse Communication: Mobility status  Activity Tolerance: Patient tolerated treatment well Patient left: in bed;with call bell/phone within reach;with bed alarm set;with nursing/sitter in room  OT Visit Diagnosis: Unsteadiness on feet (R26.81);Other abnormalities of gait and mobility (R26.89);Muscle weakness (generalized) (M62.81)                Time: 1610-9604 OT Time Calculation (min): 29 min Charges:  OT General Charges $OT Visit: 1 Visit OT Evaluation $OT Eval Moderate Complexity: 1 Mod OT Treatments $Self Care/Home Management : 8-22 mins  Bradd Canary, OTR/L Acute Rehab Services Office: [phone]   Lorre Munroe 09/26/2022, 8:36 AM

## 2022-09-27 ENCOUNTER — Inpatient Hospital Stay (HOSPITAL_COMMUNITY): Payer: Medicare HMO | Admitting: Anesthesiology

## 2022-09-27 ENCOUNTER — Inpatient Hospital Stay (HOSPITAL_COMMUNITY): Payer: Self-pay | Admitting: Anesthesiology

## 2022-09-27 ENCOUNTER — Encounter (HOSPITAL_COMMUNITY): Payer: Self-pay | Admitting: Family Medicine

## 2022-09-27 ENCOUNTER — Encounter (HOSPITAL_COMMUNITY)
Admission: EM | Disposition: A | Discharge status: Home / Self Care | Payer: Self-pay | Source: Home / Self Care | Attending: Internal Medicine

## 2022-09-27 ENCOUNTER — Other Ambulatory Visit: Payer: Self-pay

## 2022-09-27 DIAGNOSIS — L0231 Cutaneous abscess of buttock: Secondary | ICD-10-CM

## 2022-09-27 DIAGNOSIS — I2510 Atherosclerotic heart disease of native coronary artery without angina pectoris: Secondary | ICD-10-CM

## 2022-09-27 DIAGNOSIS — G9340 Encephalopathy, unspecified: Secondary | ICD-10-CM | POA: Diagnosis not present

## 2022-09-27 DIAGNOSIS — F17210 Nicotine dependence, cigarettes, uncomplicated: Secondary | ICD-10-CM

## 2022-09-27 DIAGNOSIS — I10 Essential (primary) hypertension: Secondary | ICD-10-CM | POA: Diagnosis not present

## 2022-09-27 HISTORY — PX: IRRIGATION AND DEBRIDEMENT ABSCESS: SHX5252

## 2022-09-27 LAB — CBC WITH DIFFERENTIAL/PLATELET
Abs Immature Granulocytes: 0.08 10*3/uL — ABNORMAL HIGH (ref 0.00–0.07)
Basophils Absolute: 0.1 10*3/uL (ref 0.0–0.1)
Basophils Relative: 1 %
Eosinophils Absolute: 0.1 10*3/uL (ref 0.0–0.5)
Eosinophils Relative: 3 %
HCT: 32.4 % — ABNORMAL LOW (ref 39.0–52.0)
Hemoglobin: 11.1 g/dL — ABNORMAL LOW (ref 13.0–17.0)
Immature Granulocytes: 2 %
Lymphocytes Relative: 36 %
Lymphs Abs: 1.7 10*3/uL (ref 0.7–4.0)
MCH: 27.2 pg (ref 26.0–34.0)
MCHC: 34.3 g/dL (ref 30.0–36.0)
MCV: 79.4 fL — ABNORMAL LOW (ref 80.0–100.0)
Monocytes Absolute: 0.4 10*3/uL (ref 0.1–1.0)
Monocytes Relative: 9 %
Neutro Abs: 2.3 10*3/uL (ref 1.7–7.7)
Neutrophils Relative %: 49 %
Platelets: 250 10*3/uL (ref 150–400)
RBC: 4.08 MIL/uL — ABNORMAL LOW (ref 4.22–5.81)
RDW: 13.7 % (ref 11.5–15.5)
WBC: 4.7 10*3/uL (ref 4.0–10.5)
nRBC: 0 % (ref 0.0–0.2)

## 2022-09-27 LAB — GLUCOSE, CAPILLARY
Glucose-Capillary: 405 mg/dL — ABNORMAL HIGH (ref 70–99)
Glucose-Capillary: 350 mg/dL — ABNORMAL HIGH (ref 70–99)
Glucose-Capillary: 173 mg/dL — ABNORMAL HIGH (ref 70–99)
Glucose-Capillary: 209 mg/dL — ABNORMAL HIGH (ref 70–99)
Glucose-Capillary: 226 mg/dL — ABNORMAL HIGH (ref 70–99)
Glucose-Capillary: 514 mg/dL (ref 70–99)

## 2022-09-27 LAB — BASIC METABOLIC PANEL
Anion gap: 8 (ref 5–15)
BUN: 23 mg/dL — ABNORMAL HIGH (ref 6–20)
CO2: 20 mmol/L — ABNORMAL LOW (ref 22–32)
Calcium: 8.4 mg/dL — ABNORMAL LOW (ref 8.9–10.3)
Chloride: 105 mmol/L (ref 98–111)
Creatinine, Ser: 1.35 mg/dL — ABNORMAL HIGH (ref 0.61–1.24)
GFR, Estimated: >60 mL/min (ref >60)
Glucose, Bld: 365 mg/dL — ABNORMAL HIGH (ref 70–99)
Potassium: 3.8 mmol/L (ref 3.5–5.1)
Sodium: 133 mmol/L — ABNORMAL LOW (ref 135–145)

## 2022-09-27 LAB — C-REACTIVE PROTEIN: CRP: 1.9 mg/dL — ABNORMAL HIGH (ref <1.0)

## 2022-09-27 LAB — PROCALCITONIN: Procalcitonin: 0.1 ng/mL

## 2022-09-27 LAB — BRAIN NATRIURETIC PEPTIDE: B Natriuretic Peptide: 56.5 pg/mL (ref 0.0–100.0)

## 2022-09-27 SURGERY — IRRIGATION AND DEBRIDEMENT ABSCESS
Anesthesia: General | Site: Buttocks | Laterality: Right

## 2022-09-27 MED ORDER — INSULIN ASPART 100 UNIT/ML IJ SOLN
INTRAMUSCULAR | Status: AC
  Administered 2022-09-27: 12 [IU] via SUBCUTANEOUS
  Filled 2022-09-27: qty 1

## 2022-09-27 MED ORDER — CHLORHEXIDINE GLUCONATE CLOTH 2 % EX PADS
6.0000 | MEDICATED_PAD | Freq: Every day | CUTANEOUS | Status: DC
  Administered 2022-09-28 – 2022-09-30 (×3): 6 via TOPICAL

## 2022-09-27 MED ORDER — 0.9 % SODIUM CHLORIDE (POUR BTL) OPTIME
TOPICAL | Status: DC | PRN
  Administered 2022-09-27: 1000 mL

## 2022-09-27 MED ORDER — BUPIVACAINE-EPINEPHRINE (PF) 0.25% -1:200000 IJ SOLN
INTRAMUSCULAR | Status: AC
  Filled 2022-09-27: qty 30

## 2022-09-27 MED ORDER — MORPHINE SULFATE (PF) 2 MG/ML IV SOLN
1.0000 mg | INTRAVENOUS | Status: DC | PRN
  Administered 2022-09-27: 2 mg via INTRAVENOUS
  Filled 2022-09-27: qty 1

## 2022-09-27 MED ORDER — PROPOFOL 10 MG/ML IV BOLUS
INTRAVENOUS | Status: AC
  Filled 2022-09-27: qty 20

## 2022-09-27 MED ORDER — INSULIN ASPART 100 UNIT/ML IJ SOLN
15.0000 [IU] | Freq: Once | INTRAMUSCULAR | Status: AC
  Administered 2022-09-27: 15 [IU] via SUBCUTANEOUS

## 2022-09-27 MED ORDER — MIDAZOLAM HCL 2 MG/2ML IJ SOLN
INTRAMUSCULAR | Status: DC | PRN
  Administered 2022-09-27: 2 mg via INTRAVENOUS

## 2022-09-27 MED ORDER — INSULIN ASPART 100 UNIT/ML IJ SOLN
10.0000 [IU] | Freq: Once | INTRAMUSCULAR | Status: AC
  Administered 2022-09-27: 10 [IU] via INTRAVENOUS

## 2022-09-27 MED ORDER — CARVEDILOL 6.25 MG PO TABS
6.2500 mg | ORAL_TABLET | Freq: Two times a day (BID) | ORAL | Status: DC
  Administered 2022-09-27 – 2022-09-30 (×6): 6.25 mg via ORAL
  Filled 2022-09-27 (×6): qty 1

## 2022-09-27 MED ORDER — SUGAMMADEX SODIUM 200 MG/2ML IV SOLN
INTRAVENOUS | Status: DC | PRN
  Administered 2022-09-27: 200 mg via INTRAVENOUS

## 2022-09-27 MED ORDER — INSULIN GLARGINE-YFGN 100 UNIT/ML ~~LOC~~ SOLN
25.0000 [IU] | Freq: Two times a day (BID) | SUBCUTANEOUS | Status: DC
  Administered 2022-09-27 – 2022-09-29 (×4): 25 [IU] via SUBCUTANEOUS
  Filled 2022-09-27 (×5): qty 0.25

## 2022-09-27 MED ORDER — PROPOFOL 10 MG/ML IV BOLUS
INTRAVENOUS | Status: DC | PRN
Start: 2022-09-27 — End: 2022-09-27
  Administered 2022-09-27: 200 mg via INTRAVENOUS

## 2022-09-27 MED ORDER — ORAL CARE MOUTH RINSE: 15.0000 mL | Freq: Once | OROMUCOSAL | Status: AC

## 2022-09-27 MED ORDER — FENTANYL CITRATE (PF) 250 MCG/5ML IJ SOLN
INTRAMUSCULAR | Status: DC | PRN
  Administered 2022-09-27: 100 ug via INTRAVENOUS

## 2022-09-27 MED ORDER — ONDANSETRON HCL 4 MG/2ML IJ SOLN
INTRAMUSCULAR | Status: DC | PRN
  Administered 2022-09-27: 4 mg via INTRAVENOUS

## 2022-09-27 MED ORDER — LACTATED RINGERS IV SOLN: INTRAVENOUS | Status: DC

## 2022-09-27 MED ORDER — CHLORHEXIDINE GLUCONATE 0.12 % MT SOLN
OROMUCOSAL | Status: AC
  Administered 2022-09-27: 15 mL via OROMUCOSAL
  Filled 2022-09-27: qty 15

## 2022-09-27 MED ORDER — INSULIN GLARGINE-YFGN 100 UNIT/ML ~~LOC~~ SOLN
20.0000 [IU] | Freq: Every day | SUBCUTANEOUS | Status: DC
  Filled 2022-09-27: qty 0.2

## 2022-09-27 MED ORDER — AMLODIPINE BESYLATE 10 MG PO TABS
10.0000 mg | ORAL_TABLET | Freq: Every day | ORAL | Status: DC
  Administered 2022-09-27 – 2022-09-30 (×4): 10 mg via ORAL
  Filled 2022-09-27 (×4): qty 1
  Filled 2022-09-27: qty 2

## 2022-09-27 MED ORDER — PHENYLEPHRINE HCL-NACL 20-0.9 MG/250ML-% IV SOLN
INTRAVENOUS | Status: DC | PRN
  Administered 2022-09-27: 25 ug/min via INTRAVENOUS

## 2022-09-27 MED ORDER — ROCURONIUM BROMIDE 10 MG/ML (PF) SYRINGE
PREFILLED_SYRINGE | INTRAVENOUS | Status: DC | PRN
  Administered 2022-09-27: 60 mg via INTRAVENOUS

## 2022-09-27 MED ORDER — LIDOCAINE 2% (20 MG/ML) 5 ML SYRINGE
INTRAMUSCULAR | Status: DC | PRN
  Administered 2022-09-27: 80 mg via INTRAVENOUS

## 2022-09-27 MED ORDER — FENTANYL CITRATE (PF) 100 MCG/2ML IJ SOLN: 25.0000 ug | INTRAMUSCULAR | Status: DC | PRN

## 2022-09-27 MED ORDER — FENTANYL CITRATE (PF) 250 MCG/5ML IJ SOLN
INTRAMUSCULAR | Status: AC
  Filled 2022-09-27: qty 5

## 2022-09-27 MED ORDER — INSULIN ASPART 100 UNIT/ML IJ SOLN: 0.0000 [IU] | INTRAMUSCULAR | Status: DC | PRN

## 2022-09-27 MED ORDER — BUPIVACAINE-EPINEPHRINE 0.25% -1:200000 IJ SOLN
INTRAMUSCULAR | Status: DC | PRN
  Administered 2022-09-27: 10 mL

## 2022-09-27 MED ORDER — NICOTINE 21 MG/24HR TD PT24
21.0000 mg | MEDICATED_PATCH | Freq: Every day | TRANSDERMAL | Status: DC
  Administered 2022-09-27 – 2022-09-30 (×4): 21 mg via TRANSDERMAL
  Filled 2022-09-27 (×4): qty 1

## 2022-09-27 MED ORDER — MIDAZOLAM HCL 2 MG/2ML IJ SOLN
INTRAMUSCULAR | Status: AC
  Filled 2022-09-27: qty 2

## 2022-09-27 MED ORDER — OXYCODONE HCL 5 MG PO TABS
5.0000 mg | ORAL_TABLET | ORAL | Status: DC | PRN
  Administered 2022-09-27 – 2022-09-30 (×4): 5 mg via ORAL
  Filled 2022-09-27 (×4): qty 1

## 2022-09-27 MED ORDER — MUPIROCIN 2 % EX OINT
1.0000 | TOPICAL_OINTMENT | Freq: Two times a day (BID) | CUTANEOUS | Status: DC
  Administered 2022-09-27 – 2022-09-30 (×6): 1 via NASAL
  Filled 2022-09-27 (×2): qty 22

## 2022-09-27 MED ORDER — PHENYLEPHRINE 80 MCG/ML (10ML) SYRINGE FOR IV PUSH (FOR BLOOD PRESSURE SUPPORT)
PREFILLED_SYRINGE | INTRAVENOUS | Status: DC | PRN
  Administered 2022-09-27 (×3): 160 ug via INTRAVENOUS

## 2022-09-27 MED ORDER — INSULIN ASPART 100 UNIT/ML IJ SOLN
3.0000 [IU] | Freq: Three times a day (TID) | INTRAMUSCULAR | Status: DC
  Administered 2022-09-27 – 2022-09-30 (×7): 3 [IU] via SUBCUTANEOUS

## 2022-09-27 MED ORDER — CHLORHEXIDINE GLUCONATE 0.12 % MT SOLN: 15.0000 mL | Freq: Once | OROMUCOSAL | Status: AC

## 2022-09-27 SURGICAL SUPPLY — 29 items
BNDG GAUZE DERMACEA FLUFF 4 (GAUZE/BANDAGES/DRESSINGS) IMPLANT
BNDG GZE DERMACEA 4 6PLY (GAUZE/BANDAGES/DRESSINGS) ×1
COVER SURGICAL LIGHT HANDLE (MISCELLANEOUS) ×2 IMPLANT
DRAPE HALF SHEET 40X57 (DRAPES) IMPLANT
DRAPE LAPAROSCOPIC ABDOMINAL (DRAPES) ×2 IMPLANT
ELECT REM PT RETURN 9FT ADLT (ELECTROSURGICAL) ×1
ELECTRODE REM PT RTRN 9FT ADLT (ELECTROSURGICAL) ×2 IMPLANT
GAUZE 4X4 16PLY ~~LOC~~+RFID DBL (SPONGE) IMPLANT
GAUZE PAD ABD 8X10 STRL (GAUZE/BANDAGES/DRESSINGS) IMPLANT
GAUZE SPONGE 4X4 12PLY STRL (GAUZE/BANDAGES/DRESSINGS) ×2 IMPLANT
GLOVE BIO SURGEON STRL SZ7.5 (GLOVE) ×2 IMPLANT
GOWN STRL REUS W/ TWL LRG LVL3 (GOWN DISPOSABLE) ×4 IMPLANT
GOWN STRL REUS W/TWL LRG LVL3 (GOWN DISPOSABLE) ×2
KIT BASIN OR (CUSTOM PROCEDURE TRAY) ×2 IMPLANT
KIT TURNOVER KIT B (KITS) ×2 IMPLANT
NDL HYPO 25GX1X1/2 BEV (NEEDLE) IMPLANT
NEEDLE HYPO 25GX1X1/2 BEV (NEEDLE) ×1 IMPLANT
NS IRRIG 1000ML POUR BTL (IV SOLUTION) ×2 IMPLANT
PACK GENERAL/GYN (CUSTOM PROCEDURE TRAY) ×2 IMPLANT
PAD ARMBOARD 7.5X6 YLW CONV (MISCELLANEOUS) ×4 IMPLANT
SPECIMEN JAR SMALL (MISCELLANEOUS) ×2 IMPLANT
SUT MNCRL AB 4-0 PS2 18 (SUTURE) IMPLANT
SUT VIC AB 3-0 SH 27 (SUTURE) ×1
SUT VIC AB 3-0 SH 27XBRD (SUTURE) IMPLANT
SWAB COLLECTION DEVICE MRSA (MISCELLANEOUS) IMPLANT
SWAB CULTURE ESWAB REG 1ML (MISCELLANEOUS) IMPLANT
SYR CONTROL 10ML LL (SYRINGE) IMPLANT
TOWEL GREEN STERILE (TOWEL DISPOSABLE) ×2 IMPLANT
TOWEL GREEN STERILE FF (TOWEL DISPOSABLE) ×2 IMPLANT

## 2022-09-27 NOTE — Progress Notes (Signed)
PROGRESS NOTE                                                                                                                                                                                                             Patient Demographics:    Vincent [name], is a 57 y.o. male, DOB - [birth year], ZOX:[id number]  Outpatient Primary MD for the patient is Dois Davenport, MD    LOS - 3  Admit date - 09/24/2022    Chief Complaint  Patient presents with   Code Stroke       Brief Narrative (HPI from H&P)   57 y.o. male with medical history significant of anxiety, benign essential tremor, bipolar disorder type II, bladder cancer, CAD, chronic pain, COPD, uncontrolled diabetes, diabetic peripheral neuropathy, GERD and gastroparesis presents to the ED with altered mental status.  He had extensive workup suggestive of right gluteal cleft abscess, he was admitted, I&D was done in the ER on 09/24/2022 by ER MD, neurosurgery was consulted, he was admitted by hospitalist service.  He had some nonspecific left-sided weakness and he was also seen by neurology and had an unremarkable neurological workup for that.   Subjective:   Patient in bed, appears comfortable, denies any headache, no fever, no chest pain or pressure, no shortness of breath , no abdominal pain. No new focal weakness.   Assessment  & Plan :   Acute metabolic encephalopathy due to sepsis from right gluteal cleft abscess.  CT head, CTA head and neck, CT abdomen pelvis and echocardiogram noted and stable, he underwent incision and drainage in the ER by the ER MD on 09/24/2022, sepsis pathophysiology has resolved mental status back to normal.  Continue empiric IV Vanc + Zosyn, follow blood cultures, +ve MRSA nasal PCR, general surgery following, I&D in the OR on 09/27/2022.  Hypotension with encephalopathy, sepsis and some questionable left-sided weakness.  Likely all due to sepsis  and shock, mentation back to normal, no focal deficits, CT head and CTA head and neck unremarkable, MRI C-spine unremarkable.  Stable neurowork-up.  Mentation back to normal strength near normal now.  Seen by neurology.  AKI due to sepsis on underlying CKD 3A.  Baseline creatinine around 1.4.  AKI due to sepsis now resolved.  Dyslipidemia.  On statin.    History of CVA.  Plavix and  statin for secondary prevention, Plavix will be resumed after surgery.  Hypertension.  Placed on Coreg, Norvasc and IV hydralazine as needed, monitor and adjust.  DM type II.  On Lantus and sliding scale dose adjusted for better control on 09/27/2022, poor outpatient control due to hyperglycemia as suggested by the elevated A1c.  Lab Results  Component Value Date   HGBA1C 12.4 (H) 09/25/2022   CBG (last 3)  Recent Labs    09/26/22 2200 09/27/22 0825 09/27/22 0934  GLUCAP 234* 405* 350*         Condition - Fair  Family Communication  :  None  Code Status :  Full  Consults  :  CCS, Neuro  PUD Prophylaxis : PPI   Procedures  :     TTE Bubble - 1. Left ventricular ejection fraction, by estimation, is 60 to 65%. The left ventricle has normal function. The left ventricle has no regional wall motion abnormalities. There is mild concentric left ventricular hypertrophy. Left ventricular diastolic parameters were normal.  2. Right ventricular systolic function is normal. The right ventricular size is normal.  3. The mitral valve is normal in structure. Trivial mitral valve regurgitation. No evidence of mitral stenosis.  4. The aortic valve is normal in structure. Aortic valve regurgitation is not visualized. No aortic stenosis is present.  5. The inferior vena cava is normal in size with greater than 50% respiratory variability, suggesting right atrial pressure of 3 mmHg.   MR C Spine - : 1. No evidence of discitis osteomyelitis or epidural abscess. 2. No spinal canal stenosis or neural foraminal  narrowing.  CT head.  Nonacute.    CTA head and neck.   1. No significant proximal stenosis, aneurysm, or branch vessel occlusion within the Circle of Willis. 2. Stable atherosclerotic calcifications within the cavernous internal carotid arteries without significant stenosis. 3. Stable atherosclerotic changes within the cavernous internal carotid arteries bilaterally without significant stenosis relative to the more distal vessels. 4.  Aortic Atherosclerosis (ICD10-I70.0)   CT chest abdomen pelvis with contrast- : 1. No acute aortic syndrome. 2. Subcutaneous peripherally enhancing gas and fluid collection along the right posterior gluteal cleft measuring 3.5 x 2.3 cm, concerning for abscess. 3. Diffuse bronchial wall thickening. Layering secretions in the trachea near the thoracic inlet. Query aspiration. 4. Hepatic steatosis. 5. Moderate stool load.  Correlate for constipation. Aortic Atherosclerosis (ICD10-I70.0).      Disposition Plan  :    Status is: Inpatient  DVT Prophylaxis  :    heparin injection 5,000 Units Start: 09/25/22 2200 SCD's Start: 09/24/22 2035 SCDs Start: 09/24/22 2031    Lab Results  Component Value Date   PLT 250 09/27/2022    Diet :  Diet Order             Diet NPO time specified Except for: Sips with Meds  Diet effective midnight                    Inpatient Medications  Scheduled Meds:  [MAR Hold] amLODipine  10 mg Oral Daily   [MAR Hold] carvedilol  6.25 mg Oral BID WC   [MAR Hold] Chlorhexidine Gluconate Cloth  6 each Topical Once   [MAR Hold] DULoxetine  60 mg Oral Daily   [MAR Hold] ezetimibe  10 mg Oral Daily   [MAR Hold] heparin injection (subcutaneous)  5,000 Units Subcutaneous Q8H   [MAR Hold] insulin aspart  0-15 Units Subcutaneous TID WC   insulin aspart  3 Units Subcutaneous TID WC   insulin glargine-yfgn  25 Units Subcutaneous BID   [MAR Hold] lamoTRIgine  150 mg Oral BID   [MAR Hold] lurasidone  40 mg Oral QHS   nicotine  21  mg Transdermal Daily   [MAR Hold] pantoprazole  40 mg Oral Daily   [MAR Hold] pregabalin  200 mg Oral TID   [MAR Hold] rosuvastatin  20 mg Oral QHS   [MAR Hold] sodium chloride flush  3 mL Intravenous Once   [MAR Hold] tamsulosin  0.4 mg Oral QHS   [MAR Hold] venlafaxine XR  150 mg Oral Q breakfast   Continuous Infusions:  lactated ringers 10 mL/hr at 09/27/22 0923   [MAR Hold] piperacillin-tazobactam (ZOSYN)  IV 3.375 g (09/27/22 0709)   [MAR Hold] vancomycin 1,750 mg (09/27/22 0208)   PRN Meds:.0.9 % irrigation (POUR BTL), [MAR Hold] acetaminophen, bupivacaine-EPINEPHrine, [MAR Hold] hydrALAZINE, insulin aspart   Objective:   Vitals:   09/26/22 2010 09/27/22 0400 09/27/22 0750 09/27/22 0917  BP: (!) 162/78 (!) 157/82 (!) 156/81 125/75  Pulse: 84 82 83 80  Resp:   14 18  Temp: 98.3 F (36.8 C) 98 F (36.7 C) 98.2 F (36.8 C) 97.9 F (36.6 C)  TempSrc: Oral Oral Oral Oral  SpO2: 98% 97% 98% 96%  Weight:      Height:        Wt Readings from Last 3 Encounters:  09/24/22 89.4 kg  08/25/22 80.7 kg  08/11/22 79.4 kg     Intake/Output Summary (Last 24 hours) at 09/27/2022 1020 Last data filed at 09/27/2022 0709 Gross per 24 hour  Intake 240 ml  Output 1500 ml  Net -1260 ml     Physical Exam  Awake Alert, No new F.N deficits, Normal affect Joffre.AT,PERRAL Supple Neck, No JVD,   Symmetrical Chest wall movement, Good air movement bilaterally, CTAB RRR,No Gallops,Rubs or new Murmurs,  +ve B.Sounds, Abd Soft, No tenderness,   No Cyanosis, Clubbing or edema     Data Review:    Recent Labs  Lab 09/24/22 1555 09/24/22 1604 09/25/22 0202 09/26/22 0225 09/27/22 0540  WBC 7.6  --  7.0 4.7 4.7  HGB 12.6* 11.6* 11.2* 11.2* 11.1*  HCT 37.4* 34.0* 33.8* 33.1* 32.4*  PLT 328  --  247 264 250  MCV 79.7*  --  83.9 83.2 79.4*  MCH 26.9  --  27.8 28.1 27.2  MCHC 33.7  --  33.1 33.8 34.3  RDW 13.7  --  14.1 13.9 13.7  LYMPHSABS 3.0  --   --  1.4 1.7  MONOABS 0.7  --   --   0.3 0.4  EOSABS 0.1  --   --  0.1 0.1  BASOSABS 0.1  --   --  0.0 0.1    Recent Labs  Lab 09/24/22 1555 09/24/22 1604 09/24/22 1742 09/25/22 0002 09/25/22 0202 09/26/22 0225 09/26/22 0700 09/27/22 0540  NA 139 139  --   --  136 133*  --  133*  K 4.0 4.0  --   --  4.1 4.1  --  3.8  CL 105 108  --   --  105 104  --  105  CO2 20*  --   --   --  20* 19*  --  20*  ANIONGAP 14  --   --   --  11 10  --  8  GLUCOSE 127* 126*  --   --  152* 294*  --  365*  BUN 20 24*  --   --  21* 21*  --  23*  CREATININE 1.48* 1.50*  --   --  1.18 1.27*  --  1.35*  AST 20  --   --   --  17 15  --   --   ALT 16  --   --   --  17 18  --   --   ALKPHOS 72  --   --   --  65 75  --   --   BILITOT 0.6  --   --   --  0.7 0.6  --   --   ALBUMIN 3.2*  --   --   --  2.7* 2.7*  --   --   CRP  --   --   --   --   --   --  0.9 1.9*  PROCALCITON  --   --   --   --   --   --  <0.10 0.10  LATICACIDVEN  --   --  1.6  --   --   --   --   --   INR 0.9  --   --   --   --   --   --   --   TSH  --   --   --   --  2.731  --  4.459  --   HGBA1C  --   --   --  12.4*  --   --   --   --   BNP  --   --   --   --   --   --  60.1 56.5  CALCIUM 9.3  --   --   --  8.7* 8.5*  --  8.4*      Recent Labs  Lab 09/24/22 1555 09/24/22 1742 09/25/22 0002 09/25/22 0202 09/26/22 0225 09/26/22 0700 09/27/22 0540  CRP  --   --   --   --   --  0.9 1.9*  PROCALCITON  --   --   --   --   --  <0.10 0.10  LATICACIDVEN  --  1.6  --   --   --   --   --   INR 0.9  --   --   --   --   --   --   TSH  --   --   --  2.731  --  4.459  --   HGBA1C  --   --  12.4*  --   --   --   --   BNP  --   --   --   --   --  60.1 56.5  CALCIUM 9.3  --   --  8.7* 8.5*  --  8.4*    Lab Results  Component Value Date   CHOL 265 (H) 09/26/2022   HDL 42 09/26/2022   LDLCALC UNABLE TO CALCULATE IF TRIGLYCERIDE OVER 400 mg/dL 16/12/9602   LDLDIRECT 134 (H) 09/26/2022   TRIG 564 (H) 09/26/2022   CHOLHDL 6.3 09/26/2022    Lab Results  Component Value  Date   HGBA1C 12.4 (H) 09/25/2022      Micro Results Recent Results (from the past 240 hour(s))  Blood culture (routine x 2)     Status: None (Preliminary result)   Collection Time: 09/24/22  6:18 PM   Specimen: BLOOD LEFT HAND  Result Value Ref Range Status  Specimen Description BLOOD LEFT HAND  Final   Special Requests   Final    BOTTLES DRAWN AEROBIC AND ANAEROBIC Blood Culture results may not be optimal due to an inadequate volume of blood received in culture bottles   Culture   Final    NO GROWTH 2 DAYS Performed at [facility], [address], [place], [place] [address]    Report Status PENDING  Incomplete  Blood culture (routine x 2)     Status: None (Preliminary result)   Collection Time: 09/24/22  6:34 PM   Specimen: BLOOD LEFT ARM  Result Value Ref Range Status   Specimen Description BLOOD LEFT ARM  Final   Special Requests   Final    BOTTLES DRAWN AEROBIC AND ANAEROBIC Blood Culture results may not be optimal due to an inadequate volume of blood received in culture bottles   Culture   Final    NO GROWTH 2 DAYS Performed at [facility], [address] [place], [place] [address]    Report Status PENDING  Incomplete  MRSA Next Gen by PCR, Nasal     Status: Abnormal   Collection Time: 09/26/22  5:39 AM   Specimen: Nasal Mucosa; Nasal Swab  Result Value Ref Range Status   MRSA by PCR Next Gen DETECTED (A) NOT DETECTED Final    Comment: RESULT CALLED TO, READ BACK BY AND VERIFIED WITH: Newton Pigg RN, AT 1307 09/26/22 D. VANHOOK (NOTE) The GeneXpert MRSA Assay (FDA approved for NASAL specimens only), is one component of a comprehensive MRSA colonization surveillance program. It is not intended to diagnose MRSA infection nor to guide or monitor treatment for MRSA infections. Test performance is not FDA approved in patients less than 56 years old. Performed at [facility], [address], [place], [place] [address]     Radiology  Reports ECHOCARDIOGRAM COMPLETE BUBBLE STUDY  Result Date: 09/25/2022    ECHOCARDIOGRAM REPORT   Patient Name:   Vincent [name] [facility] Date of Exam: 09/25/2022 Medical Rec #:  [id number]       Height:       72.0 in Accession #:    [id number]      Weight:       197.1 lb Date of Birth:  24-Jul-1965        BSA:          2.117 m Patient Age:    57 years        BP:           154/79 mmHg Patient Gender: M               HR:           85 bpm. Exam Location:  Inpatient Procedure: 2D Echo, Cardiac Doppler, Color Doppler and Saline Contrast Bubble            Study Indications:    Stroke I63.9  History:        Patient has prior history of Echocardiogram examinations, most                 recent 07/19/2022. Risk Factors:Hypertension, Diabetes,                 Dyslipidemia and Current Smoker.  Sonographer:    Dondra Prader RVT RCS Referring Phys: [id number] CORTNEY E DE LA TORRE IMPRESSIONS  1. Left ventricular ejection fraction, by estimation, is 60 to 65%. The left ventricle has normal function. The left ventricle has  no regional wall motion abnormalities. There is mild concentric left ventricular hypertrophy. Left ventricular diastolic parameters were normal.  2. Right ventricular systolic function is normal. The right ventricular size is normal.  3. The mitral valve is normal in structure. Trivial mitral valve regurgitation. No evidence of mitral stenosis.  4. The aortic valve is normal in structure. Aortic valve regurgitation is not visualized. No aortic stenosis is present.  5. The inferior vena cava is normal in size with greater than 50% respiratory variability, suggesting right atrial pressure of 3 mmHg. FINDINGS  Left Ventricle: Left ventricular ejection fraction, by estimation, is 60 to 65%. The left ventricle has normal function. The left ventricle has no regional wall motion abnormalities. The left ventricular internal cavity size was normal in size. There is  mild concentric left ventricular hypertrophy. Left ventricular  diastolic parameters were normal. Right Ventricle: The right ventricular size is normal. No increase in right ventricular wall thickness. Right ventricular systolic function is normal. Left Atrium: Left atrial size was normal in size. Right Atrium: Right atrial size was normal in size. Pericardium: There is no evidence of pericardial effusion. Mitral Valve: The mitral valve is normal in structure. Trivial mitral valve regurgitation. No evidence of mitral valve stenosis. Tricuspid Valve: The tricuspid valve is normal in structure. Tricuspid valve regurgitation is trivial. No evidence of tricuspid stenosis. Aortic Valve: The aortic valve is normal in structure. Aortic valve regurgitation is not visualized. No aortic stenosis is present. Aortic valve mean gradient measures 4.0 mmHg. Aortic valve peak gradient measures 7.1 mmHg. Aortic valve area, by VTI measures 2.98 cm. Pulmonic Valve: The pulmonic valve was normal in structure. Pulmonic valve regurgitation is trivial. No evidence of pulmonic stenosis. Aorta: The aortic root is normal in size and structure. Venous: The inferior vena cava is normal in size with greater than 50% respiratory variability, suggesting right atrial pressure of 3 mmHg. IAS/Shunts: No atrial level shunt detected by color flow Doppler. Agitated saline contrast was given intravenously to evaluate for intracardiac shunting.  LEFT VENTRICLE PLAX 2D LVIDd:         4.90 cm   Diastology LVIDs:         3.20 cm   LV e' medial:    8.16 cm/s LV PW:         1.00 cm   LV E/e' medial:  10.7 LV IVS:        1.10 cm   LV e' lateral:   12.40 cm/s LVOT diam:     2.20 cm   LV E/e' lateral: 7.1 LV SV:         80 LV SV Index:   38 LVOT Area:     3.80 cm  RIGHT VENTRICLE             IVC RV S prime:     14.40 cm/s  IVC diam: 2.20 cm TAPSE (M-mode): 2.8 cm LEFT ATRIUM             Index        RIGHT ATRIUM           Index LA diam:        3.60 cm 1.70 cm/m   RA Area:     10.10 cm LA Vol (A2C):   55.7 ml 26.31 ml/m   RA Volume:   16.60 ml  7.84 ml/m LA Vol (A4C):   41.7 ml 19.69 ml/m LA Biplane Vol: 52.7 ml 24.89 ml/m  AORTIC VALVE  PULMONIC VALVE AV Area (Vmax):    2.97 cm     PV Vmax:       1.04 m/s AV Area (Vmean):   3.02 cm     PV Peak grad:  4.3 mmHg AV Area (VTI):     2.98 cm AV Vmax:           133.00 cm/s AV Vmean:          89.800 cm/s AV VTI:            0.268 m AV Peak Grad:      7.1 mmHg AV Mean Grad:      4.0 mmHg LVOT Vmax:         104.00 cm/s LVOT Vmean:        71.300 cm/s LVOT VTI:          0.210 m LVOT/AV VTI ratio: 0.78  AORTA Ao Root diam: 3.50 cm Ao Asc diam:  3.50 cm MITRAL VALVE MV Area (PHT): 3.99 cm     SHUNTS MV Decel Time: 190 msec     Systemic VTI:  0.21 m MV E velocity: 87.50 cm/s   Systemic Diam: 2.20 cm MV A velocity: 110.00 cm/s MV E/A ratio:  0.80 Arvilla Meres MD Electronically signed by Arvilla Meres MD Signature Date/Time: 09/25/2022/5:26:30 PM    Final    MR CERVICAL SPINE W WO CONTRAST  Result Date: 09/25/2022 CLINICAL DATA:  Myelopathy, rule out abscess EXAM: MRI CERVICAL SPINE WITHOUT AND WITH CONTRAST TECHNIQUE: Multiplanar and multiecho pulse sequences of the cervical spine, to include the craniocervical junction and cervicothoracic junction, were obtained without and with intravenous contrast. CONTRAST:  9mL GADAVIST GADOBUTROL 1 MMOL/ML IV SOLN COMPARISON:  05/25/2022 FINDINGS: Evaluation is somewhat limited by motion artifact. Alignment: No significant listhesis. Vertebrae: No acute fracture, evidence of discitis, or suspicious osseous lesion. No abnormal enhancement. Cord: Normal signal and morphology.  No abnormal enhancement. Posterior Fossa, vertebral arteries, paraspinal tissues: Negative. Disc levels: C2-C3: No significant disc bulge. No spinal canal stenosis or neuroforaminal narrowing. C3-C4: No significant disc bulge. Facet arthropathy, with fused left facets that are better seen on the prior exam. No spinal canal stenosis or neural foraminal  narrowing. C4-C5: No significant disc bulge. No spinal canal stenosis or neuroforaminal narrowing. C5-C6: Mild disc bulge, which indents the ventral thecal sac but does not deform the spinal cord. No spinal canal stenosis or neural foraminal narrowing. C6-C7: Minimal disc bulge. Facet and uncovertebral hypertrophy. No spinal canal stenosis. No neural foraminal narrowing. C7-T1: No significant disc bulge. No spinal canal stenosis or neuroforaminal narrowing. IMPRESSION: 1. No evidence of discitis osteomyelitis or epidural abscess. 2. No spinal canal stenosis or neural foraminal narrowing. Electronically Signed   By: Wiliam Ke M.D.   On: 09/25/2022 03:25   DG Chest 2 View  Result Date: 09/25/2022 CLINICAL DATA:  Stroke EXAM: CHEST - 2 VIEW COMPARISON:  Radiograph 08/25/2022 and CT 09/24/2022 FINDINGS: Stable cardiomediastinal silhouette. Aortic atherosclerotic calcification. Low lung volumes accentuated pulmonary vascularity. No focal consolidation, pleural effusion, or pneumothorax. No displaced rib fractures. IMPRESSION: Low lung volumes without acute cardiopulmonary process. Electronically Signed   By: Minerva Fester M.D.   On: 09/25/2022 00:31   MR BRAIN WO CONTRAST  Result Date: 09/24/2022 CLINICAL DATA:  Headache, neuro deficit EXAM: MRI HEAD WITHOUT CONTRAST TECHNIQUE: Multiplanar, multiecho pulse sequences of the brain and surrounding structures were obtained without intravenous contrast. COMPARISON:  08/27/2022 MRI head, correlation is also made with 09/24/2022 CT head FINDINGS: Evaluation  is somewhat limited by motion artifact. Brain: No restricted diffusion to suggest acute or subacute infarct. No acute hemorrhage, mass, mass effect, or midline shift. No hydrocephalus or extra-axial collection. Normal pituitary and craniocervical junction. No hemosiderin deposition to suggest remote hemorrhage. Normal cerebral volume for age. Vascular: Normal arterial flow voids. Skull and upper cervical spine:  Normal marrow signal. Sinuses/Orbits: Clear paranasal sinuses. No acute finding in the orbits. Other: The mastoid air cells are well aerated. IMPRESSION: No acute intracranial process. No evidence of acute or subacute infarct. Electronically Signed   By: Wiliam Ke M.D.   On: 09/24/2022 22:59   CT ANGIO HEAD NECK W WO CM  Result Date: 09/24/2022 CLINICAL DATA:  Neuro deficit, acute, stroke suspected. EXAM: CT ANGIOGRAPHY HEAD AND NECK WITH AND WITHOUT CONTRAST TECHNIQUE: Multidetector CT imaging of the head and neck was performed using the standard protocol during bolus administration of intravenous contrast. Multiplanar CT image reconstructions and MIPs were obtained to evaluate the vascular anatomy. Carotid stenosis measurements (when applicable) are obtained utilizing NASCET criteria, using the distal internal carotid diameter as the denominator. RADIATION DOSE REDUCTION: This exam was performed according to the departmental dose-optimization program which includes automated exposure control, adjustment of the mA and/or kV according to patient size and/or use of iterative reconstruction technique. CONTRAST:  OMNIPAQUE IOHEXOL 350 MG/ML SOLN COMPARISON:  CT angio head and neck 06/10/2020 FINDINGS: CTA NECK FINDINGS Aortic arch: Atherosclerotic calcifications are present at the aortic arch and great vessel origins. No significant stenosis of greater than 50% is present relative to the more distal vessels. Right carotid system: Atherosclerotic changes are noted along the right common carotid artery without significant stenosis. Calcifications are present at the aortic arch and proximal right ICA without significant stenosis. The more distal right ICA is normal. No significant interval change is present. Left carotid system: The left common carotid artery demonstrates atherosclerotic changes without significant stenosis. Calcifications are present at the aortic arch and proximal left ICA without a  significant stenosis relative to the more distal vessel. Vertebral arteries: The right vertebral artery is the dominant vessel. Atherosclerotic calcifications are present in the V1 segment. No significant stenosis is present in either vertebral artery in the neck. Skeleton: No acute abnormalities are present. No focal osseous lesions are present. The patient is edentulous. Other neck: Soft tissues the neck are otherwise unremarkable. Salivary glands are within normal limits. Thyroid is normal. No significant adenopathy is present. No focal mucosal or submucosal lesions are present. Upper chest: The lung apices are clear. The thoracic inlet is within normal limits. Review of the MIP images confirms the above findings CTA HEAD FINDINGS Anterior circulation: Atherosclerotic calcifications are present within the cavernous internal carotid arteries bilaterally without a significant stenosis relative to the more distal vessels. The A1 and M1 segments are normal. The anterior communicating artery is patent. MCA bifurcations are within normal limits. ACA and MCA branch vessels are normal. No aneurysm is present. Posterior circulation: The right vertebral artery is the dominant vessel. PICA origin is visualized and normal. Scratched at the PICA origins are visualized and normal. The vertebrobasilar junction and basilar artery normal. Both posterior cerebral arteries scratched at the superior cerebellar arteries are patent. Both posterior cerebral arteries originate from the basilar tip. The PCA branch vessels are normal bilaterally. Venous sinuses: The dural sinuses are patent. The straight sinus and deep cerebral veins are intact. Cortical veins are within normal limits. No significant vascular malformation is evident. Review of the MIP images  confirms the above findings IMPRESSION: 1. No significant proximal stenosis, aneurysm, or branch vessel occlusion within the Circle of Willis. 2. Stable atherosclerotic calcifications  within the cavernous internal carotid arteries without significant stenosis. 3. Stable atherosclerotic changes within the cavernous internal carotid arteries bilaterally without significant stenosis relative to the more distal vessels. 4.  Aortic Atherosclerosis (ICD10-I70.0). Electronically Signed   By: Marin Roberts M.D.   On: 09/24/2022 17:35   CT Angio Chest Aorta W and/or Wo Contrast  Result Date: 09/24/2022 CLINICAL DATA:  Neuro deficit, acute aortic syndrome suspected EXAM: CT ANGIOGRAPHY CHEST, ABDOMEN AND PELVIS TECHNIQUE: Non-contrast CT of the chest was initially obtained. Multidetector CT imaging through the chest, abdomen and pelvis was performed using the standard protocol during bolus administration of intravenous contrast. Multiplanar reconstructed images and MIPs were obtained and reviewed to evaluate the vascular anatomy. RADIATION DOSE REDUCTION: This exam was performed according to the departmental dose-optimization program which includes automated exposure control, adjustment of the mA and/or kV according to patient size and/or use of iterative reconstruction technique. CONTRAST:  OMNIPAQUE IOHEXOL 350 MG/ML SOLN COMPARISON:  Chest radiograph 08/25/2022; CT abdomen and pelvis 06/21/2022; CTA chest abdomen and pelvis 06/10/2020 FINDINGS: CTA CHEST FINDINGS Cardiovascular: Normal heart size. No pericardial effusion. No acute aortic syndrome. Aortic atherosclerotic and coronary artery atherosclerotic calcification. Mediastinum/Nodes: Unremarkable esophagus. Secretions in the posterior trachea near the thoracic inlet. No thoracic adenopathy. Lungs/Pleura: Lungs are clear. No pleural effusion or pneumothorax. Mild diffuse bronchial wall thickening. Mild mosaic attenuation of the lungs compatible with air trapping. Musculoskeletal: No chest wall abnormality. No acute or significant osseous findings. Review of the MIP images confirms the above findings. CTA ABDOMEN AND PELVIS FINDINGS  VASCULAR Aorta: Moderate calcified plaque causes up to mild narrowing near the bifurcation. No aneurysm or dissection. Celiac: Patent without aneurysm or dissection. SMA: Patent without aneurysm or dissection. Renals: Calcified plaque at the origin of the right renal artery causes moderate narrowing. Patent left renal artery. No aneurysm or dissection IMA: Patent. Inflow: Scattered calcified atherosclerotic plaque causing multifocal areas of mild narrowing. No aneurysm or dissection. Veins: Patent portal vein. Review of the MIP images confirms the above findings. NON-VASCULAR Hepatobiliary: Cholecystectomy. Hepatic steatosis. No biliary dilation. Pancreas: Unremarkable. No pancreatic ductal dilatation or surrounding inflammatory changes. Spleen: Normal in size without focal abnormality. Adrenals/Urinary Tract: Unremarkable adrenal glands and kidneys. No hydronephrosis or urinary calculi. Unremarkable bladder. Stomach/Bowel: Stomach is within normal limits. Normal caliber large and small bowel. Moderate colonic stool load. Normal appendix. Lymphatic: No lymphadenopathy. Reproductive: No acute abnormality. Other: Subcutaneous peripherally enhancing gas and fluid collection along the right posterior gluteal cleft measuring 3.5 x 2.3 cm (6/344). Mild adjacent stranding. No free intraperitoneal fluid or air. Musculoskeletal: No acute osseous abnormality. Review of the MIP images confirms the above findings. IMPRESSION: 1. No acute aortic syndrome. 2. Subcutaneous peripherally enhancing gas and fluid collection along the right posterior gluteal cleft measuring 3.5 x 2.3 cm, concerning for abscess. 3. Diffuse bronchial wall thickening. Layering secretions in the trachea near the thoracic inlet. Query aspiration. 4. Hepatic steatosis. 5. Moderate stool load.  Correlate for constipation. Aortic Atherosclerosis (ICD10-I70.0). Electronically Signed   By: Minerva Fester M.D.   On: 09/24/2022 17:27   CT HEAD CODE STROKE WO  CONTRAST  Result Date: 09/24/2022 CLINICAL DATA:  Code stroke. Neuro deficit, acute, stroke suspected. EXAM: CT HEAD WITHOUT CONTRAST TECHNIQUE: Contiguous axial images were obtained from the base of the skull through the vertex without intravenous contrast. RADIATION DOSE  REDUCTION: This exam was performed according to the departmental dose-optimization program which includes automated exposure control, adjustment of the mA and/or kV according to patient size and/or use of iterative reconstruction technique. COMPARISON:  CT head without contrast 07/18/2022. MR head without contrast 08/27/2022 FINDINGS: Brain: No acute infarct, hemorrhage, or mass lesion is present. No significant white matter lesions are present. Deep brain nuclei are within normal limits. Insular ribbon is normal bilaterally. The ventricles are of normal size. No significant extraaxial fluid collection is present. The brainstem and cerebellum are within normal limits. Midline structures are within normal limits. Vascular: Atherosclerotic calcifications are present within the cavernous internal carotid arteries bilaterally. No hyperdense vessel is present. Skull: Calvarium is intact. No focal lytic or blastic lesions are present. No significant extracranial soft tissue lesion is present. Sinuses/Orbits: The paranasal sinuses and mastoid air cells are clear. The globes and orbits are within normal limits. ASPECTS [facility] Stroke Program Early CT Score) - Ganglionic level infarction (caudate, lentiform nuclei, internal capsule, insula, M1-M3 cortex): 7/7 - Supraganglionic infarction (M4-M6 cortex): 3/3 Total score (0-10 with 10 being normal): 10/10 IMPRESSION: 1. Negative CT of the head. 2. Aspects is 10/10. The above was relayed via text pager to Dr. Otelia Limes on 09/24/2022 at 16:15 . Electronically Signed   By: Marin Roberts M.D.   On: 09/24/2022 16:17      Signature  -   Susa Raring M.D on 09/27/2022 at 10:20 AM   -  To page go to  [email or web address]

## 2022-09-27 NOTE — Progress Notes (Signed)
Patient's CBG is 514 and doesn't fall within the sliding scale. He's asymptomatic. Notified Dr. Imogene Burn and new order received for IV insulin. Will administer and continue to monitor.

## 2022-09-27 NOTE — Op Note (Signed)
09/24/2022 - 09/27/2022  10:24 AM  PATIENT:  Vincent [name]  57 y.o. male  PRE-OPERATIVE DIAGNOSIS:  right buttock abscess  POST-OPERATIVE DIAGNOSIS:  right buttock abscess  PROCEDURE:  Procedure(s): INCISION AND DEBRIDEMENT OF BUTTOCK  ABSCESS (N/A)  SURGEON:  Surgeons and Role:    Griselda Miner, MD - Primary  PHYSICIAN ASSISTANT:   ASSISTANTS: none   ANESTHESIA:   local and general  EBL:  minimal   BLOOD ADMINISTERED:none  DRAINS: none   LOCAL MEDICATIONS USED:  MARCAINE     SPECIMEN:  Source of Specimen:  wall of right buttock abscess cavity  DISPOSITION OF SPECIMEN:  PATHOLOGY  COUNTS:  YES  TOURNIQUET:  * No tourniquets in log *  DICTATION: .Dragon Dictation  After informed consent was obtained the patient was brought to the operating room and placed in the supine position on the operating table.  After adequate induction of general anesthesia the patient was moved into a right side up lateral position on a beanbag and all pressure points were padded.  The right buttock area was then prepped with Betadine and draped in usual sterile manner.  An appropriate timeout was performed.  There was an opening in the skin in the right buttock just to the right of the gluteal cleft.  This area was probed with a hemostat and a much larger abscess cavity was identified.  Cultures were obtained.  The abscess cavity was drained.  The skin was opened further in a longitudinal manner so that the entire abscess cavity could be easily evacuated and packed.  Hemostasis was achieved using the Bovie electrocautery.  The edges of the wound were infiltrated with quarter percent Marcaine.  A small portion of the wall of the abscess cavity was taken for biopsy and sent to pathology.  Once the wound was clean and there did not appear to be any undrained portions of the wound and the walls were intact and did not give way to probing then the wound was packed with Kerlix gauze and sterile dressings  were applied.  The patient tolerated the procedure well.  At the end of the case all needle sponge and instrument counts were correct.  The patient was then awakened and taken to recovery in stable condition.  PLAN OF CARE: Admit to inpatient   PATIENT DISPOSITION:  PACU - hemodynamically stable.   Delay start of Pharmacological VTE agent (>24hrs) due to surgical blood loss or risk of bleeding: no

## 2022-09-27 NOTE — Anesthesia Procedure Notes (Signed)
Procedure Name: Intubation Date/Time: 09/27/2022 9:57 AM  Performed by: Randon Goldsmith, CRNAPre-anesthesia Checklist: Patient identified, Emergency Drugs available, Suction available and Patient being monitored Patient Re-evaluated:Patient Re-evaluated prior to induction Oxygen Delivery Method: Circle system utilized Preoxygenation: Pre-oxygenation with 100% oxygen Induction Type: IV induction Ventilation: Mask ventilation without difficulty Laryngoscope Size: Mac and 4 Grade View: Grade I Tube type: Oral Tube size: 7.5 mm Number of attempts: 1 Airway Equipment and Method: Stylet and Oral airway Placement Confirmation: ETT inserted through vocal cords under direct vision, positive ETCO2 and breath sounds checked- equal and bilateral Secured at: 23 cm Tube secured with: Tape Dental Injury: Teeth and Oropharynx as per pre-operative assessment

## 2022-09-27 NOTE — Progress Notes (Signed)
PT Cancellation Note  Patient Details Name: MELQUISEDEC JOURNEY MRN: [id number] DOB: 04-Nov-1965   Cancelled Treatment:    Reason Eval/Treat Not Completed: Patient at procedure or test/unavailable  Patient just left for surgery.    Jerolyn Center, PT Acute Rehabilitation Services  Office [phone]  Zena Amos 09/27/2022, 9:04 AM

## 2022-09-27 NOTE — Progress Notes (Signed)
Physical Therapy Treatment Patient Details Name: Vincent [name] MRN: [id number] DOB: March 16, 1965 Today's Date: 09/27/2022   History of Present Illness 57 y.o. male presents to [facility] hospital on 09/24/2022 with AMS, found down by family with L weakness, facial droop and aphasia. CT and MRI negative for acute findings though neurology still suspects acute infarct. Was hypotensive. Discovered to have gluteal abscess.  PMHx: DM, peripheral neuropathy, GAD, chronic pain syndrome, bipolar 2 disorder, PTSD, RA, CAD, COPD, GERD, h/o bladder cancer and gastroparesis.    PT Comments  Pt tolerated treatment well. Pt demonstrates much improved coordination and strength in left side at this time, mobilizing to the edge of bed without difficulty. Pt experiences one posterior loss of balance when ambulating, along with continued difficulty rising to standing. PT provides cues to improve transfer technique. Pt is refusing inpatient PT placement, would prefer to discharge home with continued HHPT. Pt will need a RW to improve stability at the time of discharge.    Assistance Recommended at Discharge Frequent or constant Supervision/Assistance  If plan is discharge home, recommend the following:  Can travel by private vehicle    A little help with walking and/or transfers;A lot of help with bathing/dressing/bathroom;Assistance with cooking/housework;Assist for transportation;Help with stairs or ramp for entrance   No  Equipment Recommendations  BSC/3in1    Recommendations for Other Services       Precautions / Restrictions Precautions Precautions: Fall Restrictions Weight Bearing Restrictions: No     Mobility  Bed Mobility Overal bed mobility: Needs Assistance Bed Mobility: Supine to Sit, Sit to Supine     Supine to sit: Supervision Sit to supine: Supervision        Transfers Overall transfer level: Needs assistance Equipment used: Rolling walker (2 wheels) Transfers: Sit to/from Stand Sit  to Stand: Min assist, Min guard, From elevated surface           General transfer comment: pt requiring multiple attempts to stand from low bed surface. PT encourages increased trunk flexion, able to stand with only 2 attempts to complete 2nd successful stand    Ambulation/Gait Ambulation/Gait assistance: Min guard Gait Distance (Feet): 20 Feet (20' x 2 trials) Assistive device: Rolling walker (2 wheels) Gait Pattern/deviations: Step-through pattern Gait velocity: reduced Gait velocity interpretation: <1.8 ft/sec, indicate of risk for recurrent falls   General Gait Details: pt with slowed step-through gait, one posterior loss of balance during 2nd bout of ambulation   Stairs             Wheelchair Mobility     Tilt Bed    Modified Rankin (Stroke Patients Only)       Balance Overall balance assessment: Needs assistance Sitting-balance support: No upper extremity supported, Feet supported Sitting balance-Leahy Scale: Good     Standing balance support: Single extremity supported, Reliant on assistive device for balance Standing balance-Leahy Scale: Poor                              Cognition Arousal/Alertness: Awake/alert Behavior During Therapy: WFL for tasks assessed/performed Overall Cognitive Status: Within Functional Limits for tasks assessed                                          Exercises      General Comments General comments (skin integrity, edema, etc.): VSS on RA  Pertinent Vitals/Pain Pain Assessment Pain Assessment: No/denies pain    Home Living                          Prior Function            PT Goals (current goals can now be found in the care plan section) Acute Rehab PT Goals Patient Stated Goal: to return to walking Progress towards PT goals: Progressing toward goals    Frequency    Min 3X/week      PT Plan Current plan remains appropriate    Co-evaluation               AM-PAC PT "6 Clicks" Mobility   Outcome Measure  Help needed turning from your back to your side while in a flat bed without using bedrails?: A Little Help needed moving from lying on your back to sitting on the side of a flat bed without using bedrails?: A Little Help needed moving to and from a bed to a chair (including a wheelchair)?: A Little Help needed standing up from a chair using your arms (e.g., wheelchair or bedside chair)?: A Little Help needed to walk in hospital room?: A Little Help needed climbing 3-5 steps with a railing? : A Little 6 Click Score: 18    End of Session Equipment Utilized During Treatment: Gait belt Activity Tolerance: Patient tolerated treatment well Patient left: in bed;with call bell/phone within reach;with bed alarm set Nurse Communication: Mobility status PT Visit Diagnosis: Other abnormalities of gait and mobility (R26.89);Muscle weakness (generalized) (M62.81);Other symptoms and signs involving the nervous system (R29.898);Ataxic gait (R26.0)     Time: 4098-1191 PT Time Calculation (min) (ACUTE ONLY): 25 min  Charges:    $Gait Training: 8-22 mins $Therapeutic Activity: 8-22 mins PT General Charges $$ ACUTE PT VISIT: 1 Visit                     Arlyss Gandy, PT, DPT Acute Rehabilitation Office [phone]    Arlyss Gandy 09/27/2022, 3:07 PM

## 2022-09-27 NOTE — Anesthesia Preprocedure Evaluation (Signed)
Anesthesia Evaluation  Patient identified by MRN, date of birth, ID band Patient awake    Reviewed: Allergy & Precautions, NPO status , Patient's Chart, lab work & pertinent test results  Airway Mallampati: II  TM Distance: >3 FB Neck ROM: Full    Dental no notable dental hx. (+) Edentulous Upper, Edentulous Lower   Pulmonary sleep apnea , COPD, Current Smoker and Patient abstained from smoking.   Pulmonary exam normal        Cardiovascular hypertension, + CAD and + Cardiac Stents   Rhythm:Regular Rate:Normal  ECHO:  1. Left ventricular ejection fraction, by estimation, is 60 to 65%. The  left ventricle has normal function. The left ventricle has no regional  wall motion abnormalities. There is mild concentric left ventricular  hypertrophy. Left ventricular diastolic  parameters were normal.   2. Right ventricular systolic function is normal. The right ventricular  size is normal.   3. The mitral valve is normal in structure. Trivial mitral valve  regurgitation. No evidence of mitral stenosis.   4. The aortic valve is normal in structure. Aortic valve regurgitation is  not visualized. No aortic stenosis is present.   5. The inferior vena cava is normal in size with greater than 50%  respiratory variability, suggesting right atrial pressure of 3 mmHg.     Neuro/Psych Seizures -,   Anxiety  Bipolar Disorder   TIACVA    GI/Hepatic Neg liver ROS, hiatal hernia,GERD  Medicated,,Buttock abscess   Endo/Other  diabetes, Type 2, Insulin DependentHypothyroidism    Renal/GU   negative genitourinary   Musculoskeletal  (+) Arthritis , Osteoarthritis,    Abdominal Normal abdominal exam  (+)   Peds  Hematology  (+) Blood dyscrasia, anemia   Anesthesia Other Findings   Reproductive/Obstetrics                             Anesthesia Physical Anesthesia Plan  ASA: 3  Anesthesia Plan: General    Post-op Pain Management:    Induction: Intravenous  PONV Risk Score and Plan: 1 and Ondansetron, Dexamethasone, Midazolam and Treatment may vary due to age or medical condition  Airway Management Planned: Mask and Oral ETT  Additional Equipment: None  Intra-op Plan:   Post-operative Plan: Extubation in OR  Informed Consent: I have reviewed the patients History and Physical, chart, labs and discussed the procedure including the risks, benefits and alternatives for the proposed anesthesia with the patient or authorized representative who has indicated his/her understanding and acceptance.     Dental advisory given  Plan Discussed with:   Anesthesia Plan Comments:        Anesthesia Quick Evaluation

## 2022-09-27 NOTE — Inpatient Diabetes Management (Signed)
Inpatient Diabetes Program Recommendations  AACE/ADA: New Consensus Statement on Inpatient Glycemic Control (2015)  Target Ranges:  Prepandial:   less than 140 mg/dL      Peak postprandial:   less than 180 mg/dL (1-2 hours)      Critically ill patients:  140 - 180 mg/dL   Lab Results  Component Value Date   GLUCAP 234 (H) 09/26/2022   HGBA1C 12.4 (H) 09/25/2022    Review of Glycemic Control  Diabetes history: DM2 Outpatient Diabetes medications: Humalog 35 units TID, Toujeo 35 units BID Current orders for Inpatient glycemic control: Semglee 10 every day, Novolog 0-15 TID  HgbA1C - 12.4%, down from 14.1% (08/26/2022)  Inpatient Diabetes Program Recommendations:    Consider increasing Semglee to 15 units BID  Consider adding Novolog 0-5 HS correction  May benefit from addition of meal coverage insulin - 6 units TID with meals  Follow glucose trends.  Thank you. Ailene Ards, RD, LDN, CDCES Inpatient Diabetes Coordinator [phone]

## 2022-09-27 NOTE — Anesthesia Postprocedure Evaluation (Signed)
Anesthesia Post Note  Patient: Vincent [name]  Procedure(s) Performed: IRRIGATION AND DEBRIDEMENT OF RIGHT BUTTOCK  ABSCESS (Right: Buttocks)     Patient location during evaluation: PACU Anesthesia Type: General Level of consciousness: awake and alert Pain management: pain level controlled Vital Signs Assessment: post-procedure vital signs reviewed and stable Respiratory status: spontaneous breathing, nonlabored ventilation, respiratory function stable and patient connected to nasal cannula oxygen Cardiovascular status: blood pressure returned to baseline and stable Postop Assessment: no apparent nausea or vomiting Anesthetic complications: no   No notable events documented.  Last Vitals:  Vitals:   09/27/22 1200 09/27/22 1620  BP: 93/61 115/71  Pulse: 77 77  Resp: (!) 8 17  Temp:  36.6 C  SpO2: 94% 96%    Last Pain:  Vitals:   09/27/22 1120  TempSrc: Oral  PainSc:                  Nelle Don Denisa Enterline

## 2022-09-27 NOTE — Interval H&P Note (Signed)
History and Physical Interval Note:  09/27/2022 9:13 AM  Vincent [name]  has presented today for surgery, with the diagnosis of buttock abscess.  The various methods of treatment have been discussed with the patient and family. After consideration of risks, benefits and other options for treatment, the patient has consented to  Procedure(s): IRRIGATION AND DEBRIDEMENT OF BUTTOCK  ABSCESS (N/A) as a surgical intervention.  The patient's history has been reviewed, patient examined, no change in status, stable for surgery.  I have reviewed the patient's chart and labs.  Questions were answered to the patient's satisfaction.     Chevis Pretty III

## 2022-09-27 NOTE — Transfer of Care (Signed)
Immediate Anesthesia Transfer of Care Note  Patient: Vincent [name]  Procedure(s) Performed: IRRIGATION AND DEBRIDEMENT OF BUTTOCK  ABSCESS  Patient Location: PACU  Anesthesia Type:General  Level of Consciousness: awake, alert , and oriented  Airway & Oxygen Therapy: Patient Spontanous Breathing  Post-op Assessment: Report given to RN and Post -op Vital signs reviewed and stable  Post vital signs: Reviewed and stable  Last Vitals:  Vitals Value Taken Time  BP 110/68 09/27/22 1036  Temp    Pulse 79 09/27/22 1037  Resp 19   SpO2 96 % 09/27/22 1037  Vitals shown include unfiled device data.  Last Pain:  Vitals:   09/27/22 0917  TempSrc: Oral  PainSc: 0-No pain         Complications: No notable events documented.

## 2022-09-28 ENCOUNTER — Encounter (HOSPITAL_COMMUNITY): Payer: Self-pay | Admitting: General Surgery

## 2022-09-28 DIAGNOSIS — G9340 Encephalopathy, unspecified: Secondary | ICD-10-CM | POA: Diagnosis not present

## 2022-09-28 LAB — BRAIN NATRIURETIC PEPTIDE: B Natriuretic Peptide: 22.2 pg/mL (ref 0.0–100.0)

## 2022-09-28 LAB — CBC WITH DIFFERENTIAL/PLATELET
Abs Immature Granulocytes: 0.07 10*3/uL (ref 0.00–0.07)
Basophils Absolute: 0 10*3/uL (ref 0.0–0.1)
Basophils Relative: 1 %
Eosinophils Absolute: 0.1 10*3/uL (ref 0.0–0.5)
Eosinophils Relative: 2 %
HCT: 30.9 % — ABNORMAL LOW (ref 39.0–52.0)
Hemoglobin: 10.5 g/dL — ABNORMAL LOW (ref 13.0–17.0)
Immature Granulocytes: 2 %
Lymphocytes Relative: 45 %
Lymphs Abs: 2 10*3/uL (ref 0.7–4.0)
MCH: 28.3 pg (ref 26.0–34.0)
MCHC: 34 g/dL (ref 30.0–36.0)
MCV: 83.3 fL (ref 80.0–100.0)
Monocytes Absolute: 0.4 10*3/uL (ref 0.1–1.0)
Monocytes Relative: 9 %
Neutro Abs: 1.8 10*3/uL (ref 1.7–7.7)
Neutrophils Relative %: 41 %
Platelets: 246 10*3/uL (ref 150–400)
RBC: 3.71 MIL/uL — ABNORMAL LOW (ref 4.22–5.81)
RDW: 14 % (ref 11.5–15.5)
WBC: 4.4 10*3/uL (ref 4.0–10.5)
nRBC: 0 % (ref 0.0–0.2)

## 2022-09-28 LAB — BASIC METABOLIC PANEL
Anion gap: 9 (ref 5–15)
BUN: 28 mg/dL — ABNORMAL HIGH (ref 6–20)
CO2: 22 mmol/L (ref 22–32)
Calcium: 8.4 mg/dL — ABNORMAL LOW (ref 8.9–10.3)
Chloride: 104 mmol/L (ref 98–111)
Creatinine, Ser: 1.43 mg/dL — ABNORMAL HIGH (ref 0.61–1.24)
GFR, Estimated: 58 mL/min — ABNORMAL LOW (ref >60)
Glucose, Bld: 299 mg/dL — ABNORMAL HIGH (ref 70–99)
Potassium: 4 mmol/L (ref 3.5–5.1)
Sodium: 135 mmol/L (ref 135–145)

## 2022-09-28 LAB — PROCALCITONIN: Procalcitonin: 0.13 ng/mL

## 2022-09-28 LAB — GLUCOSE, CAPILLARY
Glucose-Capillary: 312 mg/dL — ABNORMAL HIGH (ref 70–99)
Glucose-Capillary: 293 mg/dL — ABNORMAL HIGH (ref 70–99)
Glucose-Capillary: 223 mg/dL — ABNORMAL HIGH (ref 70–99)
Glucose-Capillary: 196 mg/dL — ABNORMAL HIGH (ref 70–99)
Glucose-Capillary: 210 mg/dL — ABNORMAL HIGH (ref 70–99)

## 2022-09-28 LAB — C-REACTIVE PROTEIN: CRP: 1.8 mg/dL — ABNORMAL HIGH (ref <1.0)

## 2022-09-28 LAB — SURGICAL PATHOLOGY

## 2022-09-28 MED ORDER — CLOPIDOGREL BISULFATE 75 MG PO TABS
75.0000 mg | ORAL_TABLET | Freq: Every day | ORAL | Status: DC
  Administered 2022-09-28 – 2022-09-30 (×3): 75 mg via ORAL
  Filled 2022-09-28 (×3): qty 1

## 2022-09-28 NOTE — Progress Notes (Signed)
Physical Therapy Treatment Patient Details Name: Vincent [name] MRN: [id number] DOB: [birth year] Today's Date: 09/28/2022   History of Present Illness 57 y.o. male presents to [facility] hospital on 09/24/2022 with AMS, found down by family with L weakness, facial droop and aphasia. CT and MRI negative for acute findings though neurology still suspects acute infarct. Was hypotensive. Discovered to have gluteal abscess, I&D of R buttocks wound 7/17.  PMHx: DM, peripheral neuropathy, GAD, chronic pain syndrome, bipolar 2 disorder, PTSD, RA, CAD, COPD, GERD, h/o bladder cancer and gastroparesis.    PT Comments  Pt has made significant progress since initial evaluation. Pt is currently Mod I to CGA for all functional mobility. Pt made improvements with gait this session with distance and decreased need for physical assistance. Pt currently lives with his sister who is able to assist on discharge. Due to current functional status, home set up and available assistance at home recommending skilled physical therapy services at a decreased frequency of 3x/week on discharge in order to return to safe functional activities. Pt demonstrates no sighs/symptoms of cardiac/respiratory distress throughout session.      Assistance Recommended at Discharge Intermittent Supervision/Assistance  If plan is discharge home, recommend the following:  Can travel by private vehicle    A little help with walking and/or transfers;Assistance with cooking/housework;Assist for transportation;Help with stairs or ramp for entrance   Yes  Equipment Recommendations  BSC/3in1;Rolling walker (2 wheels)    Recommendations for Other Services       Precautions / Restrictions Precautions Precautions: Fall Restrictions Weight Bearing Restrictions: No     Mobility  Bed Mobility Overal bed mobility: Modified Independent Bed Mobility: Supine to Sit, Sit to Supine     Supine to sit: Modified independent (Device/Increase time) Sit to  supine: Modified independent (Device/Increase time)   General bed mobility comments: increased time/effort but able to complete without assist Patient Response: Cooperative  Transfers Overall transfer level: Modified independent Equipment used: Rolling walker (2 wheels) Transfers: Sit to/from Stand Sit to Stand: Modified independent (Device/Increase time), From elevated surface           General transfer comment: pt states that all of his furniture at home is elevated. He put his sofa on risers becuase he was experiencing difficulty going from sit to stand so EOB slightly elevated. Pt was Mod I for sit to stand with RW with slight elevation. Pt has safe sitting practices and was able to state why he does this and how he is improving his safety.    Ambulation/Gait Ambulation/Gait assistance: Min guard Gait Distance (Feet): 300 Feet Assistive device: Rolling walker (2 wheels) Gait Pattern/deviations: Step-through pattern, Narrow base of support, Ataxic Gait velocity: reduced Gait velocity interpretation: <1.8 ft/sec, indicate of risk for recurrent falls   General Gait Details: Pt demonstrates slow, ataxic gait pattern with Min guard, 1x mild LOB posteriorly with hip strategy to maintain balance.   Stairs Stairs:  (not applicable level entry)           Wheelchair Mobility     Tilt Bed Tilt Bed Patient Response: Cooperative  Modified Rankin (Stroke Patients Only)       Balance Overall balance assessment: Needs assistance Sitting-balance support: No upper extremity supported, Feet supported Sitting balance-Leahy Scale: Good     Standing balance support: Bilateral upper extremity supported Standing balance-Leahy Scale: Fair Standing balance comment: Pt utilizes hip strategy early on with LOB       Cognition Arousal/Alertness: Awake/alert Behavior During Therapy: WFL for tasks  assessed/performed Overall Cognitive Status: Within Functional Limits for tasks  assessed     General Comments: pleasant, functional; shows insight into deficits           General Comments General comments (skin integrity, edema, etc.): Pt is motivated to go home and lives with his sister who can help him on discahrge.      Pertinent Vitals/Pain Pain Assessment Pain Assessment: 0-10 Pain Score: 4  Pain Location: buttocks Pain Descriptors / Indicators: Sore Pain Intervention(s): Limited activity within patient's tolerance     PT Goals (current goals can now be found in the care plan section) Acute Rehab PT Goals Patient Stated Goal: to return to walking PT Goal Formulation: With patient Time For Goal Achievement: 10/09/22 Potential to Achieve Goals: Fair Progress towards PT goals: Progressing toward goals    Frequency    Min 3X/week      PT Plan Current plan remains appropriate       AM-PAC PT "6 Clicks" Mobility   Outcome Measure  Help needed turning from your back to your side while in a flat bed without using bedrails?: None Help needed moving from lying on your back to sitting on the side of a flat bed without using bedrails?: None Help needed moving to and from a bed to a chair (including a wheelchair)?: None Help needed standing up from a chair using your arms (e.g., wheelchair or bedside chair)?: None Help needed to walk in hospital room?: A Little Help needed climbing 3-5 steps with a railing? : A Little 6 Click Score: 22    End of Session Equipment Utilized During Treatment: Gait belt Activity Tolerance: Patient tolerated treatment well Patient left: in bed;with call bell/phone within reach;with bed alarm set Nurse Communication: Mobility status PT Visit Diagnosis: Other abnormalities of gait and mobility (R26.89);Muscle weakness (generalized) (M62.81);Other symptoms and signs involving the nervous system (R29.898);Ataxic gait (R26.0)     Time: 2536-6440 PT Time Calculation (min) (ACUTE ONLY): 23 min  Charges:    $Gait  Training: 8-22 mins $Therapeutic Activity: 8-22 mins PT General Charges $$ ACUTE PT VISIT: 1 Visit                     Harrel Carina, DPT, CLT  Acute Rehabilitation Services Office: [phone] (Secure chat preferred)    Claudia Desanctis 09/28/2022, 1:16 PM

## 2022-09-28 NOTE — Inpatient Diabetes Management (Signed)
Inpatient Diabetes Program Recommendations  AACE/ADA: New Consensus Statement on Inpatient Glycemic Control (2015)  Target Ranges:  Prepandial:   less than 140 mg/dL      Peak postprandial:   less than 180 mg/dL (1-2 hours)      Critically ill patients:  140 - 180 mg/dL   Lab Results  Component Value Date   GLUCAP 223 (H) 09/28/2022   HGBA1C 12.4 (H) 09/25/2022    Review of Glycemic Control  Diabetes history: DM2 Outpatient Diabetes medications: Humalog 35 units TID, Toujeo 35 units BID Current orders for Inpatient glycemic control: Semglee 25 BID, Novolog 0-15 TID + 3 units TID   HgbA1C - 12.4%, down from 14.1% (08/26/2022)  Inpatient Diabetes Program Recommendations:    Consider increasing Novolog to 8 units TID with meals if eating > 50%  Continue to follow.  Thank you. Ailene Ards, RD, LDN, CDCES Inpatient Diabetes Coordinator [phone]

## 2022-09-28 NOTE — Progress Notes (Signed)
Occupational Therapy Treatment Patient Details Name: Vincent [name] MRN: [id number] DOB: 1965/09/15 Today's Date: 09/28/2022   History of present illness 57 y.o. male presents to [facility] hospital on 09/24/2022 with AMS, found down by family with L weakness, facial droop and aphasia. CT and MRI negative for acute findings though neurology still suspects acute infarct. Was hypotensive. Discovered to have gluteal abscess, I&D of R buttocks wound 7/17.  PMHx: DM, peripheral neuropathy, GAD, chronic pain syndrome, bipolar 2 disorder, PTSD, RA, CAD, COPD, GERD, h/o bladder cancer and gastroparesis.   OT comments  Pt making steady progress towards OT goals. Focused session on fall prevention strategies for ADLs/IADLs and safety techniques with LB dressing tasks. Pt able to manage standing/LB ADLs assessed with no more than min guard today and showing appropriate safety awareness. Continue to rec HHOT at DC.   Recommendations for follow up therapy are one component of a multi-disciplinary discharge planning process, led by the attending physician.  Recommendations may be updated based on patient status, additional functional criteria and insurance authorization.    Assistance Recommended at Discharge Set up Supervision/Assistance  Patient can return home with the following  A little help with bathing/dressing/bathroom;Assistance with cooking/housework;Assist for transportation;Help with stairs or ramp for entrance   Equipment Recommendations  Wheelchair cushion (measurements OT);Wheelchair (measurements OT)    Recommendations for Other Services      Precautions / Restrictions Precautions Precautions: Fall Restrictions Weight Bearing Restrictions: No       Mobility Bed Mobility Overal bed mobility: Modified Independent Bed Mobility: Supine to Sit, Sit to Supine           General bed mobility comments: increased time/effort but able to complete without assist    Transfers Overall transfer  level: Needs assistance Equipment used: Rolling walker (2 wheels) Transfers: Sit to/from Stand Sit to Stand: Supervision, From elevated surface           General transfer comment: increased effort and slightly elevated bed with pt able to stand with RW and without assist     Balance Overall balance assessment: Needs assistance Sitting-balance support: No upper extremity supported, Feet supported Sitting balance-Leahy Scale: Good     Standing balance support: Single extremity supported, Reliant on assistive device for balance Standing balance-Leahy Scale: Fair Standing balance comment: able to briefly stand without UE support but reliant on at least one UE support with dynamic tasks                           ADL either performed or assessed with clinical judgement   ADL Overall ADL's : Needs assistance/impaired                     Lower Body Dressing: Min guard;Sit to/from stand;Sitting/lateral leans Lower Body Dressing Details (indicate cue type and reason): pt reports using sock aide at home for socks consistently. further problem solving/simulated safely pulling up pants. pt reports typically leaning against the wall to pull up pants. educated and pt return demonstrated alternating UE support on RW for alternative safe method to don pants over waist               General ADL Comments: Reports just walking around unit with PT using RW and feeling like he did well. Extended time spent discussing fall prevention strategies, safety with ADLs/basic IADLs that pt does (making cup of coffee using rollator, etc)    Extremity/Trunk Assessment Upper Extremity Assessment Upper Extremity  Assessment: LUE deficits/detail LUE Deficits / Details: reports some essential tremors when attempting to complete tasks - often attempting to use R hand to compensate. baseline 5th digit amp   Lower Extremity Assessment Lower Extremity Assessment: Defer to PT evaluation         Vision   Vision Assessment?: No apparent visual deficits   Perception     Praxis      Cognition Arousal/Alertness: Awake/alert Behavior During Therapy: WFL for tasks assessed/performed Overall Cognitive Status: Within Functional Limits for tasks assessed                                          Exercises      Shoulder Instructions       General Comments      Pertinent Vitals/ Pain       Pain Assessment Pain Assessment: Faces Faces Pain Scale: Hurts a little bit Pain Location: buttocks Pain Descriptors / Indicators: Sore Pain Intervention(s): Monitored during session  Home Living                                          Prior Functioning/Environment              Frequency  Min 2X/week        Progress Toward Goals  OT Goals(current goals can now be found in the care plan section)  Progress towards OT goals: Progressing toward goals  Acute Rehab OT Goals Patient Stated Goal: go home soon OT Goal Formulation: With patient Time For Goal Achievement: 10/10/22 Potential to Achieve Goals: Good ADL Goals Pt Will Perform Lower Body Dressing: with modified independence;sit to/from stand Pt Will Transfer to Toilet: ambulating;with modified independence Pt/caregiver will Perform Home Exercise Program: Increased strength;Left upper extremity;Both right and left upper extremity;With theraband;With theraputty;Independently;With written HEP provided  Plan Discharge plan remains appropriate    Co-evaluation                 AM-PAC OT "6 Clicks" Daily Activity     Outcome Measure   Help from another person eating meals?: A Little Help from another person taking care of personal grooming?: A Little Help from another person toileting, which includes using toliet, bedpan, or urinal?: A Little Help from another person bathing (including washing, rinsing, drying)?: A Little Help from another person to put on and taking off  regular upper body clothing?: A Little Help from another person to put on and taking off regular lower body clothing?: A Little 6 Click Score: 18    End of Session Equipment Utilized During Treatment: Rolling walker (2 wheels)  OT Visit Diagnosis: Unsteadiness on feet (R26.81);Other abnormalities of gait and mobility (R26.89);Muscle weakness (generalized) (M62.81)   Activity Tolerance Patient tolerated treatment well   Patient Left in bed;with call bell/phone within reach;with bed alarm set;with nursing/sitter in room   Nurse Communication          Time: 9604-5409 OT Time Calculation (min): 16 min  Charges: OT General Charges $OT Visit: 1 Visit OT Treatments $Self Care/Home Management : 8-22 mins  Bradd Canary, OTR/L Acute Rehab Services Office: [phone]   Lorre Munroe 09/28/2022, 12:47 PM

## 2022-09-28 NOTE — Progress Notes (Signed)
1 Day Post-Op  Subjective: CC: R buttock pain well controlled.   Objective: Vital signs in last 24 hours: Temp:  [97.5 F (36.4 C)-98.4 F (36.9 C)] 97.5 F (36.4 C) (07/18 0550) Pulse Rate:  [77-83] 78 (07/18 0818) Resp:  [8-18] 17 (07/18 0818) BP: (86-134)/(52-84) 134/75 (07/18 0818) SpO2:  [93 %-96 %] 95 % (07/18 0818) FiO2 (%):  [96 %] 96 % (07/17 1620) Last BM Date : 09/26/22  Intake/Output from previous day: 07/17 0701 - 07/18 0700 In: 1693 [I.V.:700; IV Piggyback:993] Out: 1405 [Urine:1400; Blood:5] Intake/Output this shift: No intake/output data recorded.  PE: Gen:  Alert, NAD, pleasant GU: Seen with RN. R buttock incision packing removed. ~3cm in length, ~5cm deep. No drainage. Wound clean. Minimal surrounding induration and erythema. No fluctuance.   Lab Results:  Recent Labs    09/27/22 0540 09/28/22 0435  WBC 4.7 4.4  HGB 11.1* 10.5*  HCT 32.4* 30.9*  PLT 250 246   BMET Recent Labs    09/27/22 0540 09/28/22 0435  NA 133* 135  K 3.8 4.0  CL 105 104  CO2 20* 22  GLUCOSE 365* 299*  BUN 23* 28*  CREATININE 1.35* 1.43*  CALCIUM 8.4* 8.4*   PT/INR No results for input(s): "LABPROT", "INR" in the last 72 hours. CMP     Component Value Date/Time   NA 135 09/28/2022 0435   NA 137 04/01/2015 1006   K 4.0 09/28/2022 0435   CL 104 09/28/2022 0435   CO2 22 09/28/2022 0435   GLUCOSE 299 (H) 09/28/2022 0435   BUN 28 (H) 09/28/2022 0435   BUN 14 04/01/2015 1006   CREATININE 1.43 (H) 09/28/2022 0435   CALCIUM 8.4 (L) 09/28/2022 0435   PROT 5.3 (L) 09/26/2022 0225   PROT 6.0 04/01/2015 1006   ALBUMIN 2.7 (L) 09/26/2022 0225   ALBUMIN 4.2 04/01/2015 1006   AST 15 09/26/2022 0225   ALT 18 09/26/2022 0225   ALKPHOS 75 09/26/2022 0225   BILITOT 0.6 09/26/2022 0225   BILITOT 0.3 04/01/2015 1006   GFRNONAA 58 (L) 09/28/2022 0435   GFRAA 46 (L) 08/09/2019 1206   Lipase     Component Value Date/Time   LIPASE 54 (H) 08/25/2022 2137     Studies/Results: No results found.  Anti-infectives: Anti-infectives (From admission, onward)    Start     Dose/Rate Route Frequency Ordered Stop   09/26/22 1400  piperacillin-tazobactam (ZOSYN) IVPB 3.375 g        3.375 g 12.5 mL/hr over 240 Minutes Intravenous Every 8 hours 09/26/22 0932     09/25/22 0800  ceFEPIme (MAXIPIME) 2 g in sodium chloride 0.9 % 100 mL IVPB  Status:  Discontinued        2 g 200 mL/hr over 30 Minutes Intravenous 2 times daily 09/24/22 2351 09/25/22 0747   09/25/22 0800  ceFEPIme (MAXIPIME) 2 g in sodium chloride 0.9 % 100 mL IVPB  Status:  Discontinued        2 g 200 mL/hr over 30 Minutes Intravenous Every 8 hours 09/25/22 0747 09/26/22 0932   09/25/22 0030  vancomycin (VANCOREADY) IVPB 1750 mg/350 mL        1,750 mg 175 mL/hr over 120 Minutes Intravenous Every 24 hours 09/25/22 0026     09/24/22 2200  hydroxychloroquine (PLAQUENIL) tablet 200 mg  Status:  Discontinued       Note to Pharmacy: Wife called and spoke with nurse.  This is a medication the patient takes at home  that was not entered at admission. Dr. Sibyl Parr informed.     200 mg Oral 2 times daily 09/24/22 2035 09/26/22 1021   09/24/22 1800  ceFEPIme (MAXIPIME) 2 g in sodium chloride 0.9 % 100 mL IVPB        2 g 200 mL/hr over 30 Minutes Intravenous  Once 09/24/22 1747 09/24/22 1906   09/24/22 1800  vancomycin (VANCOREADY) IVPB 2000 mg/400 mL        2,000 mg 200 mL/hr over 120 Minutes Intravenous  Once 09/24/22 1755 09/24/22 2045   09/24/22 1745  ceFEPIme (MAXIPIME) 1 g in sodium chloride 0.9 % 100 mL IVPB  Status:  Discontinued        1 g 200 mL/hr over 30 Minutes Intravenous  Once 09/24/22 1742 09/24/22 1747        Assessment/Plan POD 1 s/p I&D of R buttock by Dr. Carolynne Edouard - 09/27/22 - Wound clean. Afebrile. WBC wnl. No indication for further surgery - Cont abx. Follow cx. Narrow based on result. Recommend 5d abx post op - Bx pending.  - Cont BID WTD dressing changes. He reports his  sister is going to help change these at d/c. I have asked RN to teach his daughter this when she is at bedside.  - Okay to shower. Please remove packing prior to showering, let soapy water run over the incision, pat dry and replace dressing after - We arrange f/u and sign off. Call back with any questions or concerns. Discussed discharge instructions, restrictions and return/call back precautions.    LOS: 4 days    Vincent [name] , [facility] 09/28/2022, 11:01 AM Please see Amion for pager number during day hours 7:00am-4:30pm

## 2022-09-28 NOTE — Discharge Instructions (Signed)
Wet to Dry WOUND CARE: - Change dressing twice daily - Supplies: sterile saline, kerlex, scissors, ABD pads, tape  Remove dressing and all packing carefully, moistening with sterile saline as needed to avoid packing/internal dressing sticking to the wound. 2.   Clean edges of skin around the wound with water/gauze, making sure there is no tape debris or leakage left on skin that could cause skin irritation or breakdown. 3.   Dampen and clean kerlex with sterile saline and pack wound from wound base to skin level, making sure to take note of any possible areas of wound tracking, tunneling and packing appropriately. Wound can be packed loosely. Trim kerlex to size if a whole kerlex is not required. 4.   Cover wound with a dry ABD pad and secure with tape.  5.   Write the date/time on the dry dressing/tape to better track when the last dressing change occurred. - apply any skin protectant/powder if recommended by clinician to protect skin/skin folds. - change dressing as needed if leakage occurs, wound gets contaminated, or patient requests to shower. - You may shower daily with wound open and following the shower the wound should be dried and a clean dressing placed.  - Medical grade tape as well as packing supplies can be found at [facility] on [address] or [facility] on [address]. The remaining supplies can be found at your local drug store, [employer] etc. 

## 2022-09-28 NOTE — Care Management Important Message (Signed)
Important Message  Patient Details  Name: Vincent [name] MRN: [id number] Date of Birth: 12-Dec-1965   Medicare Important Message Given:  Yes     Lacie Landry Stefan Church 09/28/2022, 3:43 PM

## 2022-09-28 NOTE — Plan of Care (Signed)
  Problem: Education: Goal: Knowledge of disease or condition will improve Outcome: Progressing   Problem: Ischemic Stroke/TIA Tissue Perfusion: Goal: Complications of ischemic stroke/TIA will be minimized Outcome: Progressing   Problem: Coping: Goal: Will verbalize positive feelings about self Outcome: Progressing   Problem: Self-Care: Goal: Ability to participate in self-care as condition permits will improve Outcome: Progressing   Problem: Nutrition: Goal: Risk of aspiration will decrease Outcome: Progressing   Problem: Nutritional: Goal: Maintenance of adequate nutrition will improve Outcome: Progressing   Problem: Clinical Measurements: Goal: Ability to maintain clinical measurements within normal limits will improve Outcome: Progressing

## 2022-09-28 NOTE — Progress Notes (Signed)
PROGRESS NOTE                                                                                                                                                                                                             Patient Demographics:    Vincent [name], is a 57 y.o. male, DOB - [birth year], ZOX:[id number]  Outpatient Primary MD for the patient is Dois Davenport, MD    LOS - 4  Admit date - 09/24/2022    Chief Complaint  Patient presents with   Code Stroke       Brief Narrative (HPI from H&P)   57 y.o. male with medical history significant of anxiety, benign essential tremor, bipolar disorder type II, bladder cancer, CAD, chronic pain, COPD, uncontrolled diabetes, diabetic peripheral neuropathy, GERD and gastroparesis presents to the ED with altered mental status.  He had extensive workup suggestive of right gluteal cleft abscess, he was admitted, I&D was done in the ER on 09/24/2022 by ER MD, neurosurgery was consulted, he was admitted by hospitalist service.  He had some nonspecific left-sided weakness and he was also seen by neurology and had an unremarkable neurological workup for that.   Subjective:   Patient in bed, appears comfortable, denies any headache, no fever, no chest pain or pressure, no shortness of breath , no abdominal pain. No new focal weakness.    Assessment  & Plan :   Acute metabolic encephalopathy due to sepsis from right gluteal cleft abscess.  CT head, CTA head and neck, CT abdomen pelvis and echocardiogram noted and stable, he underwent incision and drainage in the ER by the ER MD on 09/24/2022, sepsis pathophysiology has resolved mental status back to normal.  Continue empiric IV Vanc + Zosyn, follow blood & wound cultures, +ve MRSA nasal PCR, general surgery following, I&D in the OR on 09/27/2022.  Hypotension with encephalopathy, sepsis and some questionable left-sided weakness.  Likely all due to  sepsis and shock, mentation back to normal, no focal deficits, CT head and CTA head and neck unremarkable, MRI C-spine unremarkable.  Stable neurowork-up.  Mentation back to normal strength near normal now.  Seen by neurology.  AKI due to sepsis on underlying CKD 3A.  Baseline creatinine around 1.4.  AKI due to sepsis now resolved.  Dyslipidemia.  On statin.    History of CVA.  Plavix and statin for secondary prevention   Hypertension.  Placed on Coreg, Norvasc and IV hydralazine as needed, monitor and adjust.  DM type II.  On Lantus and sliding scale dose adjusted for better control on 09/27/2022, poor outpatient control due to hyperglycemia as suggested by the elevated A1c.  Lab Results  Component Value Date   HGBA1C 12.4 (H) 09/25/2022   CBG (last 3)  Recent Labs    09/27/22 2218 09/28/22 0106 09/28/22 0817  GLUCAP 514* 312* 293*         Condition - Fair  Family Communication  :  None  Code Status :  Full  Consults  :  CCS, Neuro  PUD Prophylaxis : PPI   Procedures  :     TTE Bubble - 1. Left ventricular ejection fraction, by estimation, is 60 to 65%. The left ventricle has normal function. The left ventricle has no regional wall motion abnormalities. There is mild concentric left ventricular hypertrophy. Left ventricular diastolic parameters were normal.  2. Right ventricular systolic function is normal. The right ventricular size is normal.  3. The mitral valve is normal in structure. Trivial mitral valve regurgitation. No evidence of mitral stenosis.  4. The aortic valve is normal in structure. Aortic valve regurgitation is not visualized. No aortic stenosis is present.  5. The inferior vena cava is normal in size with greater than 50% respiratory variability, suggesting right atrial pressure of 3 mmHg.   MR C Spine - : 1. No evidence of discitis osteomyelitis or epidural abscess. 2. No spinal canal stenosis or neural foraminal narrowing.  CT head.  Nonacute.    CTA  head and neck.   1. No significant proximal stenosis, aneurysm, or branch vessel occlusion within the Circle of Willis. 2. Stable atherosclerotic calcifications within the cavernous internal carotid arteries without significant stenosis. 3. Stable atherosclerotic changes within the cavernous internal carotid arteries bilaterally without significant stenosis relative to the more distal vessels. 4.  Aortic Atherosclerosis (ICD10-I70.0)   CT chest abdomen pelvis with contrast- : 1. No acute aortic syndrome. 2. Subcutaneous peripherally enhancing gas and fluid collection along the right posterior gluteal cleft measuring 3.5 x 2.3 cm, concerning for abscess. 3. Diffuse bronchial wall thickening. Layering secretions in the trachea near the thoracic inlet. Query aspiration. 4. Hepatic steatosis. 5. Moderate stool load.  Correlate for constipation. Aortic Atherosclerosis (ICD10-I70.0).      Disposition Plan  :    Status is: Inpatient  DVT Prophylaxis  :    heparin injection 5,000 Units Start: 09/25/22 2200 SCD's Start: 09/24/22 2035 SCDs Start: 09/24/22 2031    Lab Results  Component Value Date   PLT 246 09/28/2022    Diet :  Diet Order             Diet Carb Modified Fluid consistency: Thin; Room service appropriate? Yes  Diet effective now                    Inpatient Medications  Scheduled Meds:  amLODipine  10 mg Oral Daily   carvedilol  6.25 mg Oral BID WC   Chlorhexidine Gluconate Cloth  6 each Topical Q0600   DULoxetine  60 mg Oral Daily   ezetimibe  10 mg Oral Daily   heparin injection (subcutaneous)  5,000 Units Subcutaneous Q8H   insulin aspart  0-15 Units Subcutaneous TID WC   insulin aspart  3 Units Subcutaneous TID WC   insulin glargine-yfgn  25 Units Subcutaneous BID  lamoTRIgine  150 mg Oral BID   lurasidone  40 mg Oral QHS   mupirocin ointment  1 Application Nasal BID   nicotine  21 mg Transdermal Daily   pantoprazole  40 mg Oral Daily   pregabalin  200 mg  Oral TID   rosuvastatin  20 mg Oral QHS   sodium chloride flush  3 mL Intravenous Once   tamsulosin  0.4 mg Oral QHS   venlafaxine XR  150 mg Oral Q breakfast   Continuous Infusions:  piperacillin-tazobactam (ZOSYN)  IV 3.375 g (09/28/22 0551)   vancomycin 1,750 mg (09/28/22 0133)   PRN Meds:.acetaminophen, hydrALAZINE, morphine injection, oxyCODONE   Objective:   Vitals:   09/28/22 0108 09/28/22 0200 09/28/22 0550 09/28/22 0818  BP: 131/84  129/79 134/75  Pulse: 81 83 77 78  Resp: 17 17 10 17   Temp: 98 F (36.7 C)  (!) 97.5 F (36.4 C)   TempSrc: Oral  Oral Oral  SpO2: 94% 93% 95% 95%  Weight:      Height:        Wt Readings from Last 3 Encounters:  09/24/22 89.4 kg  08/25/22 80.7 kg  08/11/22 79.4 kg     Intake/Output Summary (Last 24 hours) at 09/28/2022 1103 Last data filed at 09/28/2022 0700 Gross per 24 hour  Intake 992.97 ml  Output 600 ml  Net 392.97 ml     Physical Exam  Awake Alert, No new F.N deficits, Normal affect Brilliant.AT,PERRAL Supple Neck, No JVD,   Symmetrical Chest wall movement, Good air movement bilaterally, CTAB RRR,No Gallops,Rubs or new Murmurs,  +ve B.Sounds, Abd Soft, No tenderness,   No Cyanosis, Clubbing or edema     Data Review:    Recent Labs  Lab 09/24/22 1555 09/24/22 1604 09/25/22 0202 09/26/22 0225 09/27/22 0540 09/28/22 0435  WBC 7.6  --  7.0 4.7 4.7 4.4  HGB 12.6* 11.6* 11.2* 11.2* 11.1* 10.5*  HCT 37.4* 34.0* 33.8* 33.1* 32.4* 30.9*  PLT 328  --  247 264 250 246  MCV 79.7*  --  83.9 83.2 79.4* 83.3  MCH 26.9  --  27.8 28.1 27.2 28.3  MCHC 33.7  --  33.1 33.8 34.3 34.0  RDW 13.7  --  14.1 13.9 13.7 14.0  LYMPHSABS 3.0  --   --  1.4 1.7 2.0  MONOABS 0.7  --   --  0.3 0.4 0.4  EOSABS 0.1  --   --  0.1 0.1 0.1  BASOSABS 0.1  --   --  0.0 0.1 0.0    Recent Labs  Lab 09/24/22 1555 09/24/22 1604 09/24/22 1742 09/25/22 0002 09/25/22 0202 09/26/22 0225 09/26/22 0700 09/27/22 0540 09/28/22 0435  NA 139 139   --   --  136 133*  --  133* 135  K 4.0 4.0  --   --  4.1 4.1  --  3.8 4.0  CL 105 108  --   --  105 104  --  105 104  CO2 20*  --   --   --  20* 19*  --  20* 22  ANIONGAP 14  --   --   --  11 10  --  8 9  GLUCOSE 127* 126*  --   --  152* 294*  --  365* 299*  BUN 20 24*  --   --  21* 21*  --  23* 28*  CREATININE 1.48* 1.50*  --   --  1.18 1.27*  --  1.35* 1.43*  AST 20  --   --   --  17 15  --   --   --   ALT 16  --   --   --  17 18  --   --   --   ALKPHOS 72  --   --   --  65 75  --   --   --   BILITOT 0.6  --   --   --  0.7 0.6  --   --   --   ALBUMIN 3.2*  --   --   --  2.7* 2.7*  --   --   --   CRP  --   --   --   --   --   --  0.9 1.9* 1.8*  PROCALCITON  --   --   --   --   --   --  <0.10 0.10 0.13  LATICACIDVEN  --   --  1.6  --   --   --   --   --   --   INR 0.9  --   --   --   --   --   --   --   --   TSH  --   --   --   --  2.731  --  4.459  --   --   HGBA1C  --   --   --  12.4*  --   --   --   --   --   BNP  --   --   --   --   --   --  60.1 56.5 22.2  CALCIUM 9.3  --   --   --  8.7* 8.5*  --  8.4* 8.4*      Recent Labs  Lab 09/24/22 1555 09/24/22 1742 09/25/22 0002 09/25/22 0202 09/26/22 0225 09/26/22 0700 09/27/22 0540 09/28/22 0435  CRP  --   --   --   --   --  0.9 1.9* 1.8*  PROCALCITON  --   --   --   --   --  <0.10 0.10 0.13  LATICACIDVEN  --  1.6  --   --   --   --   --   --   INR 0.9  --   --   --   --   --   --   --   TSH  --   --   --  2.731  --  4.459  --   --   HGBA1C  --   --  12.4*  --   --   --   --   --   BNP  --   --   --   --   --  60.1 56.5 22.2  CALCIUM 9.3  --   --  8.7* 8.5*  --  8.4* 8.4*    Lab Results  Component Value Date   CHOL 265 (H) 09/26/2022   HDL 42 09/26/2022   LDLCALC UNABLE TO CALCULATE IF TRIGLYCERIDE OVER 400 mg/dL 09/60/4540   LDLDIRECT 134 (H) 09/26/2022   TRIG 564 (H) 09/26/2022   CHOLHDL 6.3 09/26/2022    Lab Results  Component Value Date   HGBA1C 12.4 (H) 09/25/2022      Micro Results Recent Results (from  the past 240 hour(s))  Blood culture (routine x 2)     Status: None (Preliminary result)  Collection Time: 09/24/22  6:18 PM   Specimen: BLOOD LEFT HAND  Result Value Ref Range Status   Specimen Description BLOOD LEFT HAND  Final   Special Requests   Final    BOTTLES DRAWN AEROBIC AND ANAEROBIC Blood Culture results may not be optimal due to an inadequate volume of blood received in culture bottles   Culture   Final    NO GROWTH 3 DAYS Performed at [facility], [address] [place], [place] [address]    Report Status PENDING  Incomplete  Blood culture (routine x 2)     Status: None (Preliminary result)   Collection Time: 09/24/22  6:34 PM   Specimen: BLOOD LEFT ARM  Result Value Ref Range Status   Specimen Description BLOOD LEFT ARM  Final   Special Requests   Final    BOTTLES DRAWN AEROBIC AND ANAEROBIC Blood Culture results may not be optimal due to an inadequate volume of blood received in culture bottles   Culture   Final    NO GROWTH 3 DAYS Performed at [facility], [address] [place], [place] [address]    Report Status PENDING  Incomplete  MRSA Next Gen by PCR, Nasal     Status: Abnormal   Collection Time: 09/26/22  5:39 AM   Specimen: Nasal Mucosa; Nasal Swab  Result Value Ref Range Status   MRSA by PCR Next Gen DETECTED (A) NOT DETECTED Final    Comment: RESULT CALLED TO, READ BACK BY AND VERIFIED WITH: Newton Pigg RN, AT 1307 09/26/22 D. VANHOOK (NOTE) The GeneXpert MRSA Assay (FDA approved for NASAL specimens only), is one component of a comprehensive MRSA colonization surveillance program. It is not intended to diagnose MRSA infection nor to guide or monitor treatment for MRSA infections. Test performance is not FDA approved in patients less than 23 years old. Performed at [facility], [address] [place], [place] [address]   Aerobic/Anaerobic Culture w Gram Stain (surgical/deep wound)     Status: None (Preliminary result)    Collection Time: 09/27/22 10:16 AM   Specimen: Path fluid; Tissue  Result Value Ref Range Status   Specimen Description ABSCESS  Final   Special Requests NONE  Final   Gram Stain   Final    MODERATE WBC PRESENT, PREDOMINANTLY PMN MODERATE GRAM POSITIVE COCCI IN PAIRS IN CLUSTERS RARE GRAM POSITIVE RODS    Culture   Final    CULTURE REINCUBATED FOR BETTER GROWTH Performed at [facility], [address], [place], [place] [address]    Report Status PENDING  Incomplete    Radiology Reports ECHOCARDIOGRAM COMPLETE BUBBLE STUDY  Result Date: 09/25/2022    ECHOCARDIOGRAM REPORT   Patient Name:   YUAN GANN [facility] Date of Exam: 09/25/2022 Medical Rec #:  [id number]       Height:       72.0 in Accession #:    [id number]      Weight:       197.1 lb Date of Birth:  07-22-1965        BSA:          2.117 m Patient Age:    57 years        BP:           154/79 mmHg Patient Gender: M               HR:           85  bpm. Exam Location:  Inpatient Procedure: 2D Echo, Cardiac Doppler, Color Doppler and Saline Contrast Bubble            Study Indications:    Stroke I63.9  History:        Patient has prior history of Echocardiogram examinations, most                 recent 07/19/2022. Risk Factors:Hypertension, Diabetes,                 Dyslipidemia and Current Smoker.  Sonographer:    Dondra Prader RVT RCS Referring Phys: [id number] CORTNEY E DE LA TORRE IMPRESSIONS  1. Left ventricular ejection fraction, by estimation, is 60 to 65%. The left ventricle has normal function. The left ventricle has no regional wall motion abnormalities. There is mild concentric left ventricular hypertrophy. Left ventricular diastolic parameters were normal.  2. Right ventricular systolic function is normal. The right ventricular size is normal.  3. The mitral valve is normal in structure. Trivial mitral valve regurgitation. No evidence of mitral stenosis.  4. The aortic valve is normal in structure. Aortic valve regurgitation is not  visualized. No aortic stenosis is present.  5. The inferior vena cava is normal in size with greater than 50% respiratory variability, suggesting right atrial pressure of 3 mmHg. FINDINGS  Left Ventricle: Left ventricular ejection fraction, by estimation, is 60 to 65%. The left ventricle has normal function. The left ventricle has no regional wall motion abnormalities. The left ventricular internal cavity size was normal in size. There is  mild concentric left ventricular hypertrophy. Left ventricular diastolic parameters were normal. Right Ventricle: The right ventricular size is normal. No increase in right ventricular wall thickness. Right ventricular systolic function is normal. Left Atrium: Left atrial size was normal in size. Right Atrium: Right atrial size was normal in size. Pericardium: There is no evidence of pericardial effusion. Mitral Valve: The mitral valve is normal in structure. Trivial mitral valve regurgitation. No evidence of mitral valve stenosis. Tricuspid Valve: The tricuspid valve is normal in structure. Tricuspid valve regurgitation is trivial. No evidence of tricuspid stenosis. Aortic Valve: The aortic valve is normal in structure. Aortic valve regurgitation is not visualized. No aortic stenosis is present. Aortic valve mean gradient measures 4.0 mmHg. Aortic valve peak gradient measures 7.1 mmHg. Aortic valve area, by VTI measures 2.98 cm. Pulmonic Valve: The pulmonic valve was normal in structure. Pulmonic valve regurgitation is trivial. No evidence of pulmonic stenosis. Aorta: The aortic root is normal in size and structure. Venous: The inferior vena cava is normal in size with greater than 50% respiratory variability, suggesting right atrial pressure of 3 mmHg. IAS/Shunts: No atrial level shunt detected by color flow Doppler. Agitated saline contrast was given intravenously to evaluate for intracardiac shunting.  LEFT VENTRICLE PLAX 2D LVIDd:         4.90 cm   Diastology LVIDs:          3.20 cm   LV e' medial:    8.16 cm/s LV PW:         1.00 cm   LV E/e' medial:  10.7 LV IVS:        1.10 cm   LV e' lateral:   12.40 cm/s LVOT diam:     2.20 cm   LV E/e' lateral: 7.1 LV SV:         80 LV SV Index:   38 LVOT Area:     3.80 cm  RIGHT VENTRICLE  IVC RV S prime:     14.40 cm/s  IVC diam: 2.20 cm TAPSE (M-mode): 2.8 cm LEFT ATRIUM             Index        RIGHT ATRIUM           Index LA diam:        3.60 cm 1.70 cm/m   RA Area:     10.10 cm LA Vol (A2C):   55.7 ml 26.31 ml/m  RA Volume:   16.60 ml  7.84 ml/m LA Vol (A4C):   41.7 ml 19.69 ml/m LA Biplane Vol: 52.7 ml 24.89 ml/m  AORTIC VALVE                    PULMONIC VALVE AV Area (Vmax):    2.97 cm     PV Vmax:       1.04 m/s AV Area (Vmean):   3.02 cm     PV Peak grad:  4.3 mmHg AV Area (VTI):     2.98 cm AV Vmax:           133.00 cm/s AV Vmean:          89.800 cm/s AV VTI:            0.268 m AV Peak Grad:      7.1 mmHg AV Mean Grad:      4.0 mmHg LVOT Vmax:         104.00 cm/s LVOT Vmean:        71.300 cm/s LVOT VTI:          0.210 m LVOT/AV VTI ratio: 0.78  AORTA Ao Root diam: 3.50 cm Ao Asc diam:  3.50 cm MITRAL VALVE MV Area (PHT): 3.99 cm     SHUNTS MV Decel Time: 190 msec     Systemic VTI:  0.21 m MV E velocity: 87.50 cm/s   Systemic Diam: 2.20 cm MV A velocity: 110.00 cm/s MV E/A ratio:  0.80 Arvilla Meres MD Electronically signed by Arvilla Meres MD Signature Date/Time: 09/25/2022/5:26:30 PM    Final    MR CERVICAL SPINE W WO CONTRAST  Result Date: 09/25/2022 CLINICAL DATA:  Myelopathy, rule out abscess EXAM: MRI CERVICAL SPINE WITHOUT AND WITH CONTRAST TECHNIQUE: Multiplanar and multiecho pulse sequences of the cervical spine, to include the craniocervical junction and cervicothoracic junction, were obtained without and with intravenous contrast. CONTRAST:  9mL GADAVIST GADOBUTROL 1 MMOL/ML IV SOLN COMPARISON:  05/25/2022 FINDINGS: Evaluation is somewhat limited by motion artifact. Alignment: No significant  listhesis. Vertebrae: No acute fracture, evidence of discitis, or suspicious osseous lesion. No abnormal enhancement. Cord: Normal signal and morphology.  No abnormal enhancement. Posterior Fossa, vertebral arteries, paraspinal tissues: Negative. Disc levels: C2-C3: No significant disc bulge. No spinal canal stenosis or neuroforaminal narrowing. C3-C4: No significant disc bulge. Facet arthropathy, with fused left facets that are better seen on the prior exam. No spinal canal stenosis or neural foraminal narrowing. C4-C5: No significant disc bulge. No spinal canal stenosis or neuroforaminal narrowing. C5-C6: Mild disc bulge, which indents the ventral thecal sac but does not deform the spinal cord. No spinal canal stenosis or neural foraminal narrowing. C6-C7: Minimal disc bulge. Facet and uncovertebral hypertrophy. No spinal canal stenosis. No neural foraminal narrowing. C7-T1: No significant disc bulge. No spinal canal stenosis or neuroforaminal narrowing. IMPRESSION: 1. No evidence of discitis osteomyelitis or epidural abscess. 2. No spinal canal stenosis or neural foraminal narrowing. Electronically Signed  By: Wiliam Ke M.D.   On: 09/25/2022 03:25   DG Chest 2 View  Result Date: 09/25/2022 CLINICAL DATA:  Stroke EXAM: CHEST - 2 VIEW COMPARISON:  Radiograph 08/25/2022 and CT 09/24/2022 FINDINGS: Stable cardiomediastinal silhouette. Aortic atherosclerotic calcification. Low lung volumes accentuated pulmonary vascularity. No focal consolidation, pleural effusion, or pneumothorax. No displaced rib fractures. IMPRESSION: Low lung volumes without acute cardiopulmonary process. Electronically Signed   By: Minerva Fester M.D.   On: 09/25/2022 00:31   MR BRAIN WO CONTRAST  Result Date: 09/24/2022 CLINICAL DATA:  Headache, neuro deficit EXAM: MRI HEAD WITHOUT CONTRAST TECHNIQUE: Multiplanar, multiecho pulse sequences of the brain and surrounding structures were obtained without intravenous contrast.  COMPARISON:  08/27/2022 MRI head, correlation is also made with 09/24/2022 CT head FINDINGS: Evaluation is somewhat limited by motion artifact. Brain: No restricted diffusion to suggest acute or subacute infarct. No acute hemorrhage, mass, mass effect, or midline shift. No hydrocephalus or extra-axial collection. Normal pituitary and craniocervical junction. No hemosiderin deposition to suggest remote hemorrhage. Normal cerebral volume for age. Vascular: Normal arterial flow voids. Skull and upper cervical spine: Normal marrow signal. Sinuses/Orbits: Clear paranasal sinuses. No acute finding in the orbits. Other: The mastoid air cells are well aerated. IMPRESSION: No acute intracranial process. No evidence of acute or subacute infarct. Electronically Signed   By: Wiliam Ke M.D.   On: 09/24/2022 22:59   CT ANGIO HEAD NECK W WO CM  Result Date: 09/24/2022 CLINICAL DATA:  Neuro deficit, acute, stroke suspected. EXAM: CT ANGIOGRAPHY HEAD AND NECK WITH AND WITHOUT CONTRAST TECHNIQUE: Multidetector CT imaging of the head and neck was performed using the standard protocol during bolus administration of intravenous contrast. Multiplanar CT image reconstructions and MIPs were obtained to evaluate the vascular anatomy. Carotid stenosis measurements (when applicable) are obtained utilizing NASCET criteria, using the distal internal carotid diameter as the denominator. RADIATION DOSE REDUCTION: This exam was performed according to the departmental dose-optimization program which includes automated exposure control, adjustment of the mA and/or kV according to patient size and/or use of iterative reconstruction technique. CONTRAST:  OMNIPAQUE IOHEXOL 350 MG/ML SOLN COMPARISON:  CT angio head and neck 06/10/2020 FINDINGS: CTA NECK FINDINGS Aortic arch: Atherosclerotic calcifications are present at the aortic arch and great vessel origins. No significant stenosis of greater than 50% is present relative to the more  distal vessels. Right carotid system: Atherosclerotic changes are noted along the right common carotid artery without significant stenosis. Calcifications are present at the aortic arch and proximal right ICA without significant stenosis. The more distal right ICA is normal. No significant interval change is present. Left carotid system: The left common carotid artery demonstrates atherosclerotic changes without significant stenosis. Calcifications are present at the aortic arch and proximal left ICA without a significant stenosis relative to the more distal vessel. Vertebral arteries: The right vertebral artery is the dominant vessel. Atherosclerotic calcifications are present in the V1 segment. No significant stenosis is present in either vertebral artery in the neck. Skeleton: No acute abnormalities are present. No focal osseous lesions are present. The patient is edentulous. Other neck: Soft tissues the neck are otherwise unremarkable. Salivary glands are within normal limits. Thyroid is normal. No significant adenopathy is present. No focal mucosal or submucosal lesions are present. Upper chest: The lung apices are clear. The thoracic inlet is within normal limits. Review of the MIP images confirms the above findings CTA HEAD FINDINGS Anterior circulation: Atherosclerotic calcifications are present within the cavernous internal carotid arteries  bilaterally without a significant stenosis relative to the more distal vessels. The A1 and M1 segments are normal. The anterior communicating artery is patent. MCA bifurcations are within normal limits. ACA and MCA branch vessels are normal. No aneurysm is present. Posterior circulation: The right vertebral artery is the dominant vessel. PICA origin is visualized and normal. Scratched at the PICA origins are visualized and normal. The vertebrobasilar junction and basilar artery normal. Both posterior cerebral arteries scratched at the superior cerebellar arteries are  patent. Both posterior cerebral arteries originate from the basilar tip. The PCA branch vessels are normal bilaterally. Venous sinuses: The dural sinuses are patent. The straight sinus and deep cerebral veins are intact. Cortical veins are within normal limits. No significant vascular malformation is evident. Review of the MIP images confirms the above findings IMPRESSION: 1. No significant proximal stenosis, aneurysm, or branch vessel occlusion within the Circle of Willis. 2. Stable atherosclerotic calcifications within the cavernous internal carotid arteries without significant stenosis. 3. Stable atherosclerotic changes within the cavernous internal carotid arteries bilaterally without significant stenosis relative to the more distal vessels. 4.  Aortic Atherosclerosis (ICD10-I70.0). Electronically Signed   By: Marin Roberts M.D.   On: 09/24/2022 17:35   CT Angio Chest Aorta W and/or Wo Contrast  Result Date: 09/24/2022 CLINICAL DATA:  Neuro deficit, acute aortic syndrome suspected EXAM: CT ANGIOGRAPHY CHEST, ABDOMEN AND PELVIS TECHNIQUE: Non-contrast CT of the chest was initially obtained. Multidetector CT imaging through the chest, abdomen and pelvis was performed using the standard protocol during bolus administration of intravenous contrast. Multiplanar reconstructed images and MIPs were obtained and reviewed to evaluate the vascular anatomy. RADIATION DOSE REDUCTION: This exam was performed according to the departmental dose-optimization program which includes automated exposure control, adjustment of the mA and/or kV according to patient size and/or use of iterative reconstruction technique. CONTRAST:  OMNIPAQUE IOHEXOL 350 MG/ML SOLN COMPARISON:  Chest radiograph 08/25/2022; CT abdomen and pelvis 06/21/2022; CTA chest abdomen and pelvis 06/10/2020 FINDINGS: CTA CHEST FINDINGS Cardiovascular: Normal heart size. No pericardial effusion. No acute aortic syndrome. Aortic atherosclerotic and  coronary artery atherosclerotic calcification. Mediastinum/Nodes: Unremarkable esophagus. Secretions in the posterior trachea near the thoracic inlet. No thoracic adenopathy. Lungs/Pleura: Lungs are clear. No pleural effusion or pneumothorax. Mild diffuse bronchial wall thickening. Mild mosaic attenuation of the lungs compatible with air trapping. Musculoskeletal: No chest wall abnormality. No acute or significant osseous findings. Review of the MIP images confirms the above findings. CTA ABDOMEN AND PELVIS FINDINGS VASCULAR Aorta: Moderate calcified plaque causes up to mild narrowing near the bifurcation. No aneurysm or dissection. Celiac: Patent without aneurysm or dissection. SMA: Patent without aneurysm or dissection. Renals: Calcified plaque at the origin of the right renal artery causes moderate narrowing. Patent left renal artery. No aneurysm or dissection IMA: Patent. Inflow: Scattered calcified atherosclerotic plaque causing multifocal areas of mild narrowing. No aneurysm or dissection. Veins: Patent portal vein. Review of the MIP images confirms the above findings. NON-VASCULAR Hepatobiliary: Cholecystectomy. Hepatic steatosis. No biliary dilation. Pancreas: Unremarkable. No pancreatic ductal dilatation or surrounding inflammatory changes. Spleen: Normal in size without focal abnormality. Adrenals/Urinary Tract: Unremarkable adrenal glands and kidneys. No hydronephrosis or urinary calculi. Unremarkable bladder. Stomach/Bowel: Stomach is within normal limits. Normal caliber large and small bowel. Moderate colonic stool load. Normal appendix. Lymphatic: No lymphadenopathy. Reproductive: No acute abnormality. Other: Subcutaneous peripherally enhancing gas and fluid collection along the right posterior gluteal cleft measuring 3.5 x 2.3 cm (6/344). Mild adjacent stranding. No free intraperitoneal fluid or air.  Musculoskeletal: No acute osseous abnormality. Review of the MIP images confirms the above findings.  IMPRESSION: 1. No acute aortic syndrome. 2. Subcutaneous peripherally enhancing gas and fluid collection along the right posterior gluteal cleft measuring 3.5 x 2.3 cm, concerning for abscess. 3. Diffuse bronchial wall thickening. Layering secretions in the trachea near the thoracic inlet. Query aspiration. 4. Hepatic steatosis. 5. Moderate stool load.  Correlate for constipation. Aortic Atherosclerosis (ICD10-I70.0). Electronically Signed   By: Minerva Fester M.D.   On: 09/24/2022 17:27   CT HEAD CODE STROKE WO CONTRAST  Result Date: 09/24/2022 CLINICAL DATA:  Code stroke. Neuro deficit, acute, stroke suspected. EXAM: CT HEAD WITHOUT CONTRAST TECHNIQUE: Contiguous axial images were obtained from the base of the skull through the vertex without intravenous contrast. RADIATION DOSE REDUCTION: This exam was performed according to the departmental dose-optimization program which includes automated exposure control, adjustment of the mA and/or kV according to patient size and/or use of iterative reconstruction technique. COMPARISON:  CT head without contrast 07/18/2022. MR head without contrast 08/27/2022 FINDINGS: Brain: No acute infarct, hemorrhage, or mass lesion is present. No significant white matter lesions are present. Deep brain nuclei are within normal limits. Insular ribbon is normal bilaterally. The ventricles are of normal size. No significant extraaxial fluid collection is present. The brainstem and cerebellum are within normal limits. Midline structures are within normal limits. Vascular: Atherosclerotic calcifications are present within the cavernous internal carotid arteries bilaterally. No hyperdense vessel is present. Skull: Calvarium is intact. No focal lytic or blastic lesions are present. No significant extracranial soft tissue lesion is present. Sinuses/Orbits: The paranasal sinuses and mastoid air cells are clear. The globes and orbits are within normal limits. ASPECTS [facility] Stroke Program  Early CT Score) - Ganglionic level infarction (caudate, lentiform nuclei, internal capsule, insula, M1-M3 cortex): 7/7 - Supraganglionic infarction (M4-M6 cortex): 3/3 Total score (0-10 with 10 being normal): 10/10 IMPRESSION: 1. Negative CT of the head. 2. Aspects is 10/10. The above was relayed via text pager to Dr. Otelia Limes on 09/24/2022 at 16:15 . Electronically Signed   By: Marin Roberts M.D.   On: 09/24/2022 16:17      Signature  -   Susa Raring M.D on 09/28/2022 at 11:03 AM   -  To page go to [email or web address]

## 2022-09-29 DIAGNOSIS — G9340 Encephalopathy, unspecified: Secondary | ICD-10-CM | POA: Diagnosis not present

## 2022-09-29 LAB — CBC WITH DIFFERENTIAL/PLATELET
Abs Immature Granulocytes: 0.11 10*3/uL — ABNORMAL HIGH (ref 0.00–0.07)
Basophils Absolute: 0.1 10*3/uL (ref 0.0–0.1)
Basophils Relative: 1 %
Eosinophils Absolute: 0.1 10*3/uL (ref 0.0–0.5)
Eosinophils Relative: 2 %
HCT: 33.5 % — ABNORMAL LOW (ref 39.0–52.0)
Hemoglobin: 11.3 g/dL — ABNORMAL LOW (ref 13.0–17.0)
Immature Granulocytes: 2 %
Lymphocytes Relative: 45 %
Lymphs Abs: 2.3 10*3/uL (ref 0.7–4.0)
MCH: 27.6 pg (ref 26.0–34.0)
MCHC: 33.7 g/dL (ref 30.0–36.0)
MCV: 81.9 fL (ref 80.0–100.0)
Monocytes Absolute: 0.4 10*3/uL (ref 0.1–1.0)
Monocytes Relative: 7 %
Neutro Abs: 2.1 10*3/uL (ref 1.7–7.7)
Neutrophils Relative %: 43 %
Platelets: 274 10*3/uL (ref 150–400)
RBC: 4.09 MIL/uL — ABNORMAL LOW (ref 4.22–5.81)
RDW: 13.6 % (ref 11.5–15.5)
WBC: 5 10*3/uL (ref 4.0–10.5)
nRBC: 0 % (ref 0.0–0.2)

## 2022-09-29 LAB — PROCALCITONIN: Procalcitonin: 0.1 ng/mL

## 2022-09-29 LAB — GLUCOSE, CAPILLARY
Glucose-Capillary: 319 mg/dL — ABNORMAL HIGH (ref 70–99)
Glucose-Capillary: 207 mg/dL — ABNORMAL HIGH (ref 70–99)
Glucose-Capillary: 87 mg/dL (ref 70–99)
Glucose-Capillary: 161 mg/dL — ABNORMAL HIGH (ref 70–99)

## 2022-09-29 LAB — BASIC METABOLIC PANEL
Anion gap: 9 (ref 5–15)
BUN: 24 mg/dL — ABNORMAL HIGH (ref 6–20)
CO2: 24 mmol/L (ref 22–32)
Calcium: 8.7 mg/dL — ABNORMAL LOW (ref 8.9–10.3)
Chloride: 103 mmol/L (ref 98–111)
Creatinine, Ser: 1.35 mg/dL — ABNORMAL HIGH (ref 0.61–1.24)
GFR, Estimated: >60 mL/min (ref >60)
Glucose, Bld: 224 mg/dL — ABNORMAL HIGH (ref 70–99)
Potassium: 3.9 mmol/L (ref 3.5–5.1)
Sodium: 136 mmol/L (ref 135–145)

## 2022-09-29 LAB — CULTURE, BLOOD (ROUTINE X 2)
Culture: NO GROWTH
Culture: NO GROWTH

## 2022-09-29 LAB — AEROBIC/ANAEROBIC CULTURE W GRAM STAIN (SURGICAL/DEEP WOUND)

## 2022-09-29 LAB — C-REACTIVE PROTEIN: CRP: 1.6 mg/dL — ABNORMAL HIGH (ref <1.0)

## 2022-09-29 LAB — BRAIN NATRIURETIC PEPTIDE: B Natriuretic Peptide: 14.3 pg/mL (ref 0.0–100.0)

## 2022-09-29 MED ORDER — INSULIN GLARGINE-YFGN 100 UNIT/ML ~~LOC~~ SOLN
5.0000 [IU] | Freq: Once | SUBCUTANEOUS | Status: AC
  Administered 2022-09-29: 5 [IU] via SUBCUTANEOUS
  Filled 2022-09-29: qty 0.05

## 2022-09-29 MED ORDER — INSULIN GLARGINE-YFGN 100 UNIT/ML ~~LOC~~ SOLN
30.0000 [IU] | Freq: Two times a day (BID) | SUBCUTANEOUS | Status: DC
  Administered 2022-09-29 – 2022-09-30 (×2): 30 [IU] via SUBCUTANEOUS
  Filled 2022-09-29 (×3): qty 0.3

## 2022-09-29 MED ORDER — LINEZOLID 600 MG PO TABS
600.0000 mg | ORAL_TABLET | Freq: Two times a day (BID) | ORAL | Status: DC
  Administered 2022-09-29 – 2022-09-30 (×2): 600 mg via ORAL
  Filled 2022-09-29 (×4): qty 1

## 2022-09-29 NOTE — TOC Progression Note (Addendum)
Transition of Care [facility]) - Progression Note    Patient Details  Name: Vincent [name] MRN: [id number] Date of Birth: 1966/03/04  [facility] CM/SW Contact  Gordy Clement, RN Phone Number: 09/29/2022, 2:34 PM  Clinical Narrative:   Patient will dc to home when medically stable.  He does own a rolling walker but will accept the recommended BSC. To be delivered bedside by adapt.   Patient will resume services with [facility] (SN,PT and OT) Sister will transport home at DC.   TOC will continue to follow patient for any additional discharge needs             Expected Discharge Plan and Services                                               Social Determinants of Health (SDOH) Interventions SDOH Screenings   Food Insecurity: No Food Insecurity (07/18/2022)  Housing: Low Risk  (07/18/2022)  Transportation Needs: No Transportation Needs (07/18/2022)  Utilities: Not At Risk (07/18/2022)  Alcohol Screen: Low Risk  (07/31/2021)  Social Connections: Unknown (07/17/2021)   Received from [facility]  Tobacco Use: High Risk (09/27/2022)    Readmission Risk Interventions    05/26/2022    1:26 PM  Readmission Risk Prevention Plan  PCP or Specialist Appt within 3-5 Days Complete  HRI or Home Care Consult Complete  Social Work Consult for Recovery Care Planning/Counseling Complete  Palliative Care Screening Not Applicable  Medication Review Oceanographer) Complete

## 2022-09-29 NOTE — Inpatient Diabetes Management (Signed)
Inpatient Diabetes Program Recommendations  AACE/ADA: New Consensus Statement on Inpatient Glycemic Control  Target Ranges:  Prepandial:   less than 140 mg/dL      Peak postprandial:   less than 180 mg/dL (1-2 hours)      Critically ill patients:  140 - 180 mg/dL    Latest Reference Range & Units 09/28/22 01:06 09/28/22 08:17 09/28/22 12:10 09/28/22 17:13 09/28/22 21:50  Glucose-Capillary 70 - 99 mg/dL 027 (H) 253 (H) 664 (H) 196 (H) 210 (H)   Review of Glycemic Control  Diabetes history: DM2 Outpatient Diabetes medications: Toujeo 40 units BID, Humalog 20 units TID with meals Current orders for Inpatient glycemic control: Semglee 25 units BID, Novolog 3 units TID with meals, Novolog 0-15 units TID with meals  Inpatient Diabetes Program Recommendations:    Insulin: Please consider increasing Semglee to 28 units BID and meal coverage to Novolog 6 units TID with meals.  Thanks, Orlando Penner, RN, MSN, CDCES Diabetes Coordinator Inpatient Diabetes Program [phone] (Team Pager from 8am to 5pm)

## 2022-09-29 NOTE — Progress Notes (Signed)
Buttock culture as grown out staph aureus and enterococcus faecalis but sensitivity are not back yet. We will transition vanc/zosyn to empiric PO zyvox to complete 5d post op per Dr. Thedore Mins.  Ulyses Southward, PharmD, BCIDP, AAHIVP, CPP Infectious Disease Pharmacist 09/29/2022 9:38 AM

## 2022-09-29 NOTE — Plan of Care (Signed)

## 2022-09-29 NOTE — Progress Notes (Signed)
PROGRESS NOTE                                                                                                                                                                                                             Patient Demographics:    Vincent [name], is a 57 y.o. male, DOB - 1965-08-17, ZOX:[id number]  Outpatient Primary MD for the patient is Dois Davenport, MD    LOS - 5  Admit date - 09/24/2022    Chief Complaint  Patient presents with   Code Stroke       Brief Narrative (HPI from H&P)   57 y.o. male with medical history significant of anxiety, benign essential tremor, bipolar disorder type II, bladder cancer, CAD, chronic pain, COPD, uncontrolled diabetes, diabetic peripheral neuropathy, GERD and gastroparesis presents to the ED with altered mental status.  He had extensive workup suggestive of right gluteal cleft abscess, he was admitted, I&D was done in the ER on 09/24/2022 by ER MD, neurosurgery was consulted, he was admitted by hospitalist service.  He had some nonspecific left-sided weakness and he was also seen by neurology and had an unremarkable neurological workup for that.   Subjective:   Patient in bed, appears comfortable, denies any headache, no fever, no chest pain or pressure, no shortness of breath , no abdominal pain. No new focal weakness.   Assessment  & Plan :   Acute metabolic encephalopathy due to sepsis from right gluteal cleft abscess.  CT head, CTA head and neck, CT abdomen pelvis and echocardiogram noted and stable, he underwent incision and drainage in the ER by the ER MD on 09/24/2022, sepsis pathophysiology has resolved mental status back to normal.  Continue empiric IV Vanc + Zosyn, follow blood & wound cultures, +ve MRSA nasal PCR, general surgery following, I&D in the OR on 09/27/2022.  Hypotension with encephalopathy, sepsis and some questionable left-sided weakness.  Likely all due to  sepsis and shock, mentation back to normal, no focal deficits, CT head and CTA head and neck unremarkable, MRI C-spine unremarkable.  Stable neurowork-up.  Mentation back to normal strength near normal now.  Seen by neurology.  AKI due to sepsis on underlying CKD 3A.  Baseline creatinine around 1.4.  AKI due to sepsis now resolved.  Dyslipidemia.  On statin.    History of CVA.  Plavix and statin for secondary prevention   Hypertension.  Placed on Coreg, Norvasc and IV hydralazine as needed, monitor and adjust.  DM type II.  On Lantus and sliding scale dose adjusted for better control on 09/27/2022, poor outpatient control due to hyperglycemia as suggested by the elevated A1c.  Lab Results  Component Value Date   HGBA1C 12.4 (H) 09/25/2022   CBG (last 3)  Recent Labs    09/28/22 1210 09/28/22 1713 09/28/22 2150  GLUCAP 223* 196* 210*         Condition - Fair  Family Communication  :  None  Code Status :  Full  Consults  :  CCS, Neuro  PUD Prophylaxis : PPI   Procedures  :     TTE Bubble - 1. Left ventricular ejection fraction, by estimation, is 60 to 65%. The left ventricle has normal function. The left ventricle has no regional wall motion abnormalities. There is mild concentric left ventricular hypertrophy. Left ventricular diastolic parameters were normal.  2. Right ventricular systolic function is normal. The right ventricular size is normal.  3. The mitral valve is normal in structure. Trivial mitral valve regurgitation. No evidence of mitral stenosis.  4. The aortic valve is normal in structure. Aortic valve regurgitation is not visualized. No aortic stenosis is present.  5. The inferior vena cava is normal in size with greater than 50% respiratory variability, suggesting right atrial pressure of 3 mmHg.   MR C Spine - : 1. No evidence of discitis osteomyelitis or epidural abscess. 2. No spinal canal stenosis or neural foraminal narrowing.  CT head.  Nonacute.    CTA  head and neck.   1. No significant proximal stenosis, aneurysm, or branch vessel occlusion within the Circle of Willis. 2. Stable atherosclerotic calcifications within the cavernous internal carotid arteries without significant stenosis. 3. Stable atherosclerotic changes within the cavernous internal carotid arteries bilaterally without significant stenosis relative to the more distal vessels. 4.  Aortic Atherosclerosis (ICD10-I70.0)   CT chest abdomen pelvis with contrast- : 1. No acute aortic syndrome. 2. Subcutaneous peripherally enhancing gas and fluid collection along the right posterior gluteal cleft measuring 3.5 x 2.3 cm, concerning for abscess. 3. Diffuse bronchial wall thickening. Layering secretions in the trachea near the thoracic inlet. Query aspiration. 4. Hepatic steatosis. 5. Moderate stool load.  Correlate for constipation. Aortic Atherosclerosis (ICD10-I70.0).      Disposition Plan  :    Status is: Inpatient  DVT Prophylaxis  :    heparin injection 5,000 Units Start: 09/25/22 2200 SCD's Start: 09/24/22 2035 SCDs Start: 09/24/22 2031    Lab Results  Component Value Date   PLT 274 09/29/2022    Diet :  Diet Order             Diet Carb Modified Fluid consistency: Thin; Room service appropriate? Yes  Diet effective now                    Inpatient Medications  Scheduled Meds:  amLODipine  10 mg Oral Daily   carvedilol  6.25 mg Oral BID WC   Chlorhexidine Gluconate Cloth  6 each Topical Q0600   clopidogrel  75 mg Oral Daily   DULoxetine  60 mg Oral Daily   ezetimibe  10 mg Oral Daily   heparin injection (subcutaneous)  5,000 Units Subcutaneous Q8H   insulin aspart  0-15 Units Subcutaneous TID WC   insulin aspart  3 Units Subcutaneous TID WC   insulin  glargine-yfgn  30 Units Subcutaneous BID   lamoTRIgine  150 mg Oral BID   lurasidone  40 mg Oral QHS   mupirocin ointment  1 Application Nasal BID   nicotine  21 mg Transdermal Daily   pantoprazole  40 mg  Oral Daily   pregabalin  200 mg Oral TID   rosuvastatin  20 mg Oral QHS   sodium chloride flush  3 mL Intravenous Once   tamsulosin  0.4 mg Oral QHS   venlafaxine XR  150 mg Oral Q breakfast   Continuous Infusions:  piperacillin-tazobactam (ZOSYN)  IV 3.375 g (09/29/22 6962)   vancomycin 1,750 mg (09/29/22 0029)   PRN Meds:.acetaminophen, hydrALAZINE, morphine injection, oxyCODONE   Objective:   Vitals:   09/28/22 2032 09/29/22 0030 09/29/22 0200 09/29/22 0820  BP: (!) 157/86 139/67 134/75 125/74  Pulse: 82 83 82 84  Resp: 19 15 15 15   Temp: 97.9 F (36.6 C) 97.7 F (36.5 C)  (!) 97.1 F (36.2 C)  TempSrc: Oral Oral    SpO2: 95% 94%  95%  Weight:      Height:        Wt Readings from Last 3 Encounters:  09/24/22 89.4 kg  08/25/22 80.7 kg  08/11/22 79.4 kg     Intake/Output Summary (Last 24 hours) at 09/29/2022 0923 Last data filed at 09/28/2022 2000 Gross per 24 hour  Intake 291.54 ml  Output 475 ml  Net -183.46 ml     Physical Exam  Awake Alert, No new F.N deficits, Normal affect .AT,PERRAL Supple Neck, No JVD,   Symmetrical Chest wall movement, Good air movement bilaterally, CTAB RRR,No Gallops,Rubs or new Murmurs,  +ve B.Sounds, Abd Soft, No tenderness,   No Cyanosis, Clubbing or edema     Data Review:    Recent Labs  Lab 09/24/22 1555 09/24/22 1604 09/25/22 0202 09/26/22 0225 09/27/22 0540 09/28/22 0435 09/29/22 0055  WBC 7.6  --  7.0 4.7 4.7 4.4 5.0  HGB 12.6*   < > 11.2* 11.2* 11.1* 10.5* 11.3*  HCT 37.4*   < > 33.8* 33.1* 32.4* 30.9* 33.5*  PLT 328  --  247 264 250 246 274  MCV 79.7*  --  83.9 83.2 79.4* 83.3 81.9  MCH 26.9  --  27.8 28.1 27.2 28.3 27.6  MCHC 33.7  --  33.1 33.8 34.3 34.0 33.7  RDW 13.7  --  14.1 13.9 13.7 14.0 13.6  LYMPHSABS 3.0  --   --  1.4 1.7 2.0 2.3  MONOABS 0.7  --   --  0.3 0.4 0.4 0.4  EOSABS 0.1  --   --  0.1 0.1 0.1 0.1  BASOSABS 0.1  --   --  0.0 0.1 0.0 0.1   < > = values in this interval not  displayed.    Recent Labs  Lab 09/24/22 1555 09/24/22 1604 09/24/22 1742 09/25/22 0002 09/25/22 0202 09/26/22 0225 09/26/22 0700 09/27/22 0540 09/28/22 0435 09/29/22 0055  NA 139 139  --   --  136 133*  --  133* 135  --   K 4.0 4.0  --   --  4.1 4.1  --  3.8 4.0  --   CL 105 108  --   --  105 104  --  105 104  --   CO2 20*  --   --   --  20* 19*  --  20* 22  --   ANIONGAP 14  --   --   --  11 10  --  8 9  --   GLUCOSE 127* 126*  --   --  152* 294*  --  365* 299*  --   BUN 20 24*  --   --  21* 21*  --  23* 28*  --   CREATININE 1.48* 1.50*  --   --  1.18 1.27*  --  1.35* 1.43*  --   AST 20  --   --   --  17 15  --   --   --   --   ALT 16  --   --   --  17 18  --   --   --   --   ALKPHOS 72  --   --   --  65 75  --   --   --   --   BILITOT 0.6  --   --   --  0.7 0.6  --   --   --   --   ALBUMIN 3.2*  --   --   --  2.7* 2.7*  --   --   --   --   CRP  --   --   --   --   --   --  0.9 1.9* 1.8*  --   PROCALCITON  --   --   --   --   --   --  <0.10 0.10 0.13 0.10  LATICACIDVEN  --   --  1.6  --   --   --   --   --   --   --   INR 0.9  --   --   --   --   --   --   --   --   --   TSH  --   --   --   --  2.731  --  4.459  --   --   --   HGBA1C  --   --   --  12.4*  --   --   --   --   --   --   BNP  --   --   --   --   --   --  60.1 56.5 22.2  --   CALCIUM 9.3  --   --   --  8.7* 8.5*  --  8.4* 8.4*  --       Recent Labs  Lab 09/24/22 1555 09/24/22 1742 09/25/22 0002 09/25/22 0202 09/26/22 0225 09/26/22 0700 09/27/22 0540 09/28/22 0435 09/29/22 0055  CRP  --   --   --   --   --  0.9 1.9* 1.8*  --   PROCALCITON  --   --   --   --   --  <0.10 0.10 0.13 0.10  LATICACIDVEN  --  1.6  --   --   --   --   --   --   --   INR 0.9  --   --   --   --   --   --   --   --   TSH  --   --   --  2.731  --  4.459  --   --   --   HGBA1C  --   --  12.4*  --   --   --   --   --   --   BNP  --   --   --   --   --  60.1 56.5 22.2  --   CALCIUM 9.3  --   --  8.7* 8.5*  --  8.4* 8.4*  --      Lab Results  Component Value Date   CHOL 265 (H) 09/26/2022   HDL 42 09/26/2022   LDLCALC UNABLE TO CALCULATE IF TRIGLYCERIDE OVER 400 mg/dL 16/12/9602   LDLDIRECT 134 (H) 09/26/2022   TRIG 564 (H) 09/26/2022   CHOLHDL 6.3 09/26/2022    Lab Results  Component Value Date   HGBA1C 12.4 (H) 09/25/2022      Micro Results Recent Results (from the past 240 hour(s))  Blood culture (routine x 2)     Status: None   Collection Time: 09/24/22  6:18 PM   Specimen: BLOOD LEFT HAND  Result Value Ref Range Status   Specimen Description BLOOD LEFT HAND  Final   Special Requests   Final    BOTTLES DRAWN AEROBIC AND ANAEROBIC Blood Culture results may not be optimal due to an inadequate volume of blood received in culture bottles   Culture   Final    NO GROWTH 5 DAYS Performed at [facility], [address], [place], [place] [address]    Report Status 09/29/2022 FINAL  Final  Blood culture (routine x 2)     Status: None   Collection Time: 09/24/22  6:34 PM   Specimen: BLOOD LEFT ARM  Result Value Ref Range Status   Specimen Description BLOOD LEFT ARM  Final   Special Requests   Final    BOTTLES DRAWN AEROBIC AND ANAEROBIC Blood Culture results may not be optimal due to an inadequate volume of blood received in culture bottles   Culture   Final    NO GROWTH 5 DAYS Performed at [facility], [address] [place], [place] [address]    Report Status 09/29/2022 FINAL  Final  MRSA Next Gen by PCR, Nasal     Status: Abnormal   Collection Time: 09/26/22  5:39 AM   Specimen: Nasal Mucosa; Nasal Swab  Result Value Ref Range Status   MRSA by PCR Next Gen DETECTED (A) NOT DETECTED Final    Comment: RESULT CALLED TO, READ BACK BY AND VERIFIED WITH: Newton Pigg RN, AT 1307 09/26/22 D. VANHOOK (NOTE) The GeneXpert MRSA Assay (FDA approved for NASAL specimens only), is one component of a comprehensive MRSA colonization surveillance program. It is not intended to diagnose MRSA  infection nor to guide or monitor treatment for MRSA infections. Test performance is not FDA approved in patients less than 23 years old. Performed at [facility], [address], [place], [place] [address]   Aerobic/Anaerobic Culture w Gram Stain (surgical/deep wound)     Status: None (Preliminary result)   Collection Time: 09/27/22 10:16 AM   Specimen: Path fluid; Tissue  Result Value Ref Range Status   Specimen Description ABSCESS  Final   Special Requests NONE  Final   Gram Stain   Final    MODERATE WBC PRESENT, PREDOMINANTLY PMN MODERATE GRAM POSITIVE COCCI IN PAIRS IN CLUSTERS RARE GRAM POSITIVE RODS    Culture   Final    MODERATE STAPHYLOCOCCUS AUREUS MODERATE ENTEROCOCCUS FAECALIS CULTURE REINCUBATED FOR BETTER GROWTH Performed at [facility], [address], [place], [place] [address]    Report Status PENDING  Incomplete    Radiology Reports ECHOCARDIOGRAM COMPLETE BUBBLE STUDY  Result Date: 09/25/2022    ECHOCARDIOGRAM REPORT   Patient Name:   Vincent [name] [facility]  Date of Exam: 09/25/2022 Medical Rec #:  [id number]       Height:       72.0 in Accession #:    [id number]      Weight:       197.1 lb Date of Birth:  September 11, 1965        BSA:          2.117 m Patient Age:    57 years        BP:           154/79 mmHg Patient Gender: M               HR:           85 bpm. Exam Location:  Inpatient Procedure: 2D Echo, Cardiac Doppler, Color Doppler and Saline Contrast Bubble            Study Indications:    Stroke I63.9  History:        Patient has prior history of Echocardiogram examinations, most                 recent 07/19/2022. Risk Factors:Hypertension, Diabetes,                 Dyslipidemia and Current Smoker.  Sonographer:    Dondra Prader RVT RCS Referring Phys: [id number] CORTNEY E DE LA TORRE IMPRESSIONS  1. Left ventricular ejection fraction, by estimation, is 60 to 65%. The left ventricle has normal function. The left ventricle has no regional wall motion abnormalities. There  is mild concentric left ventricular hypertrophy. Left ventricular diastolic parameters were normal.  2. Right ventricular systolic function is normal. The right ventricular size is normal.  3. The mitral valve is normal in structure. Trivial mitral valve regurgitation. No evidence of mitral stenosis.  4. The aortic valve is normal in structure. Aortic valve regurgitation is not visualized. No aortic stenosis is present.  5. The inferior vena cava is normal in size with greater than 50% respiratory variability, suggesting right atrial pressure of 3 mmHg. FINDINGS  Left Ventricle: Left ventricular ejection fraction, by estimation, is 60 to 65%. The left ventricle has normal function. The left ventricle has no regional wall motion abnormalities. The left ventricular internal cavity size was normal in size. There is  mild concentric left ventricular hypertrophy. Left ventricular diastolic parameters were normal. Right Ventricle: The right ventricular size is normal. No increase in right ventricular wall thickness. Right ventricular systolic function is normal. Left Atrium: Left atrial size was normal in size. Right Atrium: Right atrial size was normal in size. Pericardium: There is no evidence of pericardial effusion. Mitral Valve: The mitral valve is normal in structure. Trivial mitral valve regurgitation. No evidence of mitral valve stenosis. Tricuspid Valve: The tricuspid valve is normal in structure. Tricuspid valve regurgitation is trivial. No evidence of tricuspid stenosis. Aortic Valve: The aortic valve is normal in structure. Aortic valve regurgitation is not visualized. No aortic stenosis is present. Aortic valve mean gradient measures 4.0 mmHg. Aortic valve peak gradient measures 7.1 mmHg. Aortic valve area, by VTI measures 2.98 cm. Pulmonic Valve: The pulmonic valve was normal in structure. Pulmonic valve regurgitation is trivial. No evidence of pulmonic stenosis. Aorta: The aortic root is normal in size and  structure. Venous: The inferior vena cava is normal in size with greater than 50% respiratory variability, suggesting right atrial pressure of 3 mmHg. IAS/Shunts: No atrial level shunt detected by color flow Doppler. Agitated saline contrast was given intravenously to evaluate  for intracardiac shunting.  LEFT VENTRICLE PLAX 2D LVIDd:         4.90 cm   Diastology LVIDs:         3.20 cm   LV e' medial:    8.16 cm/s LV PW:         1.00 cm   LV E/e' medial:  10.7 LV IVS:        1.10 cm   LV e' lateral:   12.40 cm/s LVOT diam:     2.20 cm   LV E/e' lateral: 7.1 LV SV:         80 LV SV Index:   38 LVOT Area:     3.80 cm  RIGHT VENTRICLE             IVC RV S prime:     14.40 cm/s  IVC diam: 2.20 cm TAPSE (M-mode): 2.8 cm LEFT ATRIUM             Index        RIGHT ATRIUM           Index LA diam:        3.60 cm 1.70 cm/m   RA Area:     10.10 cm LA Vol (A2C):   55.7 ml 26.31 ml/m  RA Volume:   16.60 ml  7.84 ml/m LA Vol (A4C):   41.7 ml 19.69 ml/m LA Biplane Vol: 52.7 ml 24.89 ml/m  AORTIC VALVE                    PULMONIC VALVE AV Area (Vmax):    2.97 cm     PV Vmax:       1.04 m/s AV Area (Vmean):   3.02 cm     PV Peak grad:  4.3 mmHg AV Area (VTI):     2.98 cm AV Vmax:           133.00 cm/s AV Vmean:          89.800 cm/s AV VTI:            0.268 m AV Peak Grad:      7.1 mmHg AV Mean Grad:      4.0 mmHg LVOT Vmax:         104.00 cm/s LVOT Vmean:        71.300 cm/s LVOT VTI:          0.210 m LVOT/AV VTI ratio: 0.78  AORTA Ao Root diam: 3.50 cm Ao Asc diam:  3.50 cm MITRAL VALVE MV Area (PHT): 3.99 cm     SHUNTS MV Decel Time: 190 msec     Systemic VTI:  0.21 m MV E velocity: 87.50 cm/s   Systemic Diam: 2.20 cm MV A velocity: 110.00 cm/s MV E/A ratio:  0.80 Arvilla Meres MD Electronically signed by Arvilla Meres MD Signature Date/Time: 09/25/2022/5:26:30 PM    Final       Signature  -   Susa Raring M.D on 09/29/2022 at 9:23 AM   -  To page go to [email or web address]

## 2022-09-30 ENCOUNTER — Other Ambulatory Visit (HOSPITAL_COMMUNITY): Payer: Self-pay

## 2022-09-30 DIAGNOSIS — G9340 Encephalopathy, unspecified: Secondary | ICD-10-CM | POA: Diagnosis not present

## 2022-09-30 LAB — AEROBIC/ANAEROBIC CULTURE W GRAM STAIN (SURGICAL/DEEP WOUND)

## 2022-09-30 LAB — CBC WITH DIFFERENTIAL/PLATELET
Abs Immature Granulocytes: 0.1 10*3/uL — ABNORMAL HIGH (ref 0.00–0.07)
Basophils Absolute: 0.1 10*3/uL (ref 0.0–0.1)
Basophils Relative: 1 %
Eosinophils Absolute: 0.1 10*3/uL (ref 0.0–0.5)
Eosinophils Relative: 2 %
HCT: 34.7 % — ABNORMAL LOW (ref 39.0–52.0)
Hemoglobin: 11.7 g/dL — ABNORMAL LOW (ref 13.0–17.0)
Immature Granulocytes: 2 %
Lymphocytes Relative: 39 %
Lymphs Abs: 2.2 10*3/uL (ref 0.7–4.0)
MCH: 27.1 pg (ref 26.0–34.0)
MCHC: 33.7 g/dL (ref 30.0–36.0)
MCV: 80.3 fL (ref 80.0–100.0)
Monocytes Absolute: 0.4 10*3/uL (ref 0.1–1.0)
Monocytes Relative: 7 %
Neutro Abs: 2.7 10*3/uL (ref 1.7–7.7)
Neutrophils Relative %: 49 %
Platelets: 288 10*3/uL (ref 150–400)
RBC: 4.32 MIL/uL (ref 4.22–5.81)
RDW: 13.4 % (ref 11.5–15.5)
WBC: 5.5 10*3/uL (ref 4.0–10.5)
nRBC: 0 % (ref 0.0–0.2)

## 2022-09-30 LAB — BRAIN NATRIURETIC PEPTIDE: B Natriuretic Peptide: 12.8 pg/mL (ref 0.0–100.0)

## 2022-09-30 LAB — BASIC METABOLIC PANEL WITH GFR
Anion gap: 10 (ref 5–15)
BUN: 21 mg/dL — ABNORMAL HIGH (ref 6–20)
CO2: 24 mmol/L (ref 22–32)
Calcium: 8.9 mg/dL (ref 8.9–10.3)
Chloride: 103 mmol/L (ref 98–111)
Creatinine, Ser: 1.01 mg/dL (ref 0.61–1.24)
GFR, Estimated: >60 mL/min
Glucose, Bld: 188 mg/dL — ABNORMAL HIGH (ref 70–99)
Potassium: 3.6 mmol/L (ref 3.5–5.1)
Sodium: 137 mmol/L (ref 135–145)

## 2022-09-30 LAB — GLUCOSE, CAPILLARY: Glucose-Capillary: 229 mg/dL — ABNORMAL HIGH (ref 70–99)

## 2022-09-30 LAB — C-REACTIVE PROTEIN: CRP: 1.3 mg/dL — ABNORMAL HIGH

## 2022-09-30 LAB — PROCALCITONIN: Procalcitonin: <0.1 ng/mL

## 2022-09-30 MED ORDER — CARVEDILOL 6.25 MG PO TABS
6.2500 mg | ORAL_TABLET | Freq: Two times a day (BID) | ORAL | 0 refills | Status: DC
  Filled 2022-09-30: qty 60, 30d supply, fill #0

## 2022-09-30 MED ORDER — ROSUVASTATIN CALCIUM 20 MG PO TABS
20.0000 mg | ORAL_TABLET | Freq: Every day | ORAL | 0 refills | Status: DC
  Filled 2022-09-30: qty 30, 30d supply, fill #0

## 2022-09-30 MED ORDER — EZETIMIBE 10 MG PO TABS
10.0000 mg | ORAL_TABLET | Freq: Every day | ORAL | 0 refills | Status: DC
  Filled 2022-09-30: qty 30, 30d supply, fill #0

## 2022-09-30 MED ORDER — AMLODIPINE BESYLATE 10 MG PO TABS
10.0000 mg | ORAL_TABLET | Freq: Every day | ORAL | 0 refills | Status: DC
  Filled 2022-09-30: qty 30, 30d supply, fill #0

## 2022-09-30 MED ORDER — OXYCODONE HCL 5 MG PO TABS
5.0000 mg | ORAL_TABLET | Freq: Two times a day (BID) | ORAL | 0 refills | Status: DC | PRN
  Filled 2022-09-30: qty 10, 5d supply, fill #0

## 2022-09-30 MED ORDER — LINEZOLID 600 MG PO TABS
600.0000 mg | ORAL_TABLET | Freq: Two times a day (BID) | ORAL | 0 refills | Status: DC
Start: 2022-09-30 — End: 2023-01-25
  Filled 2022-09-30: qty 6, 3d supply, fill #0

## 2022-09-30 NOTE — Plan of Care (Signed)

## 2022-09-30 NOTE — Discharge Summary (Signed)
Vincent [name] WGN:[id number] DOB: May 04, 1965 DOA: 09/24/2022  PCP: Dois Davenport, MD  Admit date: 09/24/2022  Discharge date: 09/30/2022  Admitted From: Home   Disposition:  Home   Recommendations for Outpatient Follow-up:   Follow up with PCP in 1-2 weeks  PCP Please obtain BMP/CBC, 2 view CXR in 1week,  (see Discharge instructions)   PCP Please follow up on the following pending results:    Home Health: RN, PT   Equipment/Devices: as below  Consultations: CCS, Neuro Discharge Condition: Stable    CODE STATUS: Full    Diet Recommendation: Heart Healthy Low Carb  Diet Order             Diet - low sodium heart healthy           Diet Carb Modified Fluid consistency: Thin; Room service appropriate? Yes  Diet effective now                    Chief Complaint  Patient presents with   Code Stroke     Brief history of present illness from the day of admission and additional interim summary    57 y.o. male with medical history significant of anxiety, benign essential tremor, bipolar disorder type II, bladder cancer, CAD, chronic pain, COPD, uncontrolled diabetes, diabetic peripheral neuropathy, GERD and gastroparesis presents to the ED with altered mental status.  He had extensive workup suggestive of right gluteal cleft abscess, he was admitted, I&D was done in the ER on 09/24/2022 by ER MD, neurosurgery was consulted, he was admitted by hospitalist service.  He had some nonspecific left-sided weakness and he was also seen by neurology and had an unremarkable neurological workup for that.                                                                  Hospital Course   Acute metabolic encephalopathy due to sepsis from right gluteal cleft abscess.  CT head, CTA head and neck, CT abdomen pelvis and  echocardiogram noted and stable, he underwent incision and drainage in the ER by the ER MD on 09/24/2022, sepsis pathophysiology has resolved mental status back to normal.  Continue empiric IV Vanc + Zosyn, follow blood & wound cultures, +ve MRSA nasal PCR, general surgery saw the patient, underwent I&D in the OR on 09/27/2022, wound clean, patient and family comfortable with wound care at home, will be transition to oral Zyvox based on prelim wound cultures for 3 more days upon discharge.  Post discharge follow-up with PCP and general surgery.   Hypotension with encephalopathy, sepsis and some questionable left-sided weakness.  Likely all due to sepsis and shock, mentation back to normal, no focal deficits, CT head and CTA head and neck unremarkable, MRI C-spine unremarkable.  Stable neurowork-up.  Mentation back to normal strength near normal now.  Seen by neurology.   AKI due to sepsis on underlying CKD 3A.  Baseline creatinine around 1.4.  AKI due to sepsis now resolved.   Dyslipidemia.  On statin.     History of CVA.  Plavix and statin for secondary prevention    Hypertension.  Placed on Coreg, Norvasc .   DM type II.  Continue home regimen check CBGs q. [facility] follow with PCP for further monitoring.  Lab Results  Component Value Date   HGBA1C 12.4 (H) 09/25/2022    Discharge diagnosis     Principal Problem:   Acute encephalopathy Active Problems:   Abscess, gluteal, right   Hypotension   AKI (acute kidney injury) (HCC)   Sepsis with acute renal failure (HCC)   DM type 2 with diabetic peripheral neuropathy (HCC)   Essential hypertension    Discharge instructions    Discharge Instructions     Diet - low sodium heart healthy   Complete by: As directed    Discharge instructions   Complete by: As directed    Wet to Dry WOUND CARE: - Change dressing twice daily - Supplies: sterile saline, kerlex, scissors, ABD pads, tape  1. Remove dressing and all packing carefully,  moistening with sterile saline as needed to avoid packing/internal dressing sticking to the wound. 2.   Clean edges of skin around the wound with water/gauze, making sure there is no tape debris or leakage left on skin that could cause skin irritation or breakdown. 3.   Dampen and clean kerlex with sterile saline and pack wound from wound base to skin level, making sure to take note of any possible areas of wound tracking, tunneling and packing appropriately. Wound can be packed loosely. Trim kerlex to size if a whole kerlex is not required. 4.   Cover wound with a dry ABD pad and secure with tape.  5.   Write the date/time on the dry dressing/tape to better track when the last dressing change occurred. - apply any skin protectant/powder if recommended by clinician to protect skin/skin folds. - change dressing as needed if leakage occurs, wound gets contaminated, or patient requests to shower. - You may shower daily with wound open and following the shower the wound should be dried and a clean dressing placed.  - Medical grade tape as well as packing supplies can be found at [employer] on [address] or [employer] on [address]. The remaining supplies can be found at your local drug store, [employer] etc.     Follow with Primary MD Dois Davenport, MD in 7 days   Get CBC, CMP, 2 view Chest X ray -  checked next visit with your primary MD   Activity: As tolerated with Full fall precautions use walker/cane & assistance as needed  Disposition Home   Diet: Heart Healthy Low Carb, check CBGs QAC-HS  Special Instructions: If you have smoked or chewed Tobacco  in the last 2 yrs please stop smoking, stop any regular Alcohol  and or any Recreational drug use.  On your next visit with your primary care physician please Get Medicines reviewed and adjusted.  Please request your Prim.MD to go over all Hospital Tests and Procedure/Radiological results at the follow up,  please get all Hospital records sent to your Prim MD by signing hospital release before you go home.  If you experience worsening of your admission symptoms, develop shortness of breath, life threatening emergency,  suicidal or homicidal thoughts you must seek medical attention immediately by calling 911 or calling your MD immediately  if symptoms less severe.  You Must read complete instructions/literature along with all the possible adverse reactions/side effects for all the Medicines you take and that have been prescribed to you. Take any new Medicines after you have completely understood and accpet all the possible adverse reactions/side effects.   Do not drive when taking Pain medications.  Do not take more than prescribed Pain, Sleep and Anxiety Medications   Discharge wound care:   Complete by: As directed    Wet to Dry WOUND CARE: - Change dressing twice daily - Supplies: sterile saline, kerlex, scissors, ABD pads, tape  1. Remove dressing and all packing carefully, moistening with sterile saline as needed to avoid packing/internal dressing sticking to the wound. 2.   Clean edges of skin around the wound with water/gauze, making sure there is no tape debris or leakage left on skin that could cause skin irritation or breakdown. 3.   Dampen and clean kerlex with sterile saline and pack wound from wound base to skin level, making sure to take note of any possible areas of wound tracking, tunneling and packing appropriately. Wound can be packed loosely. Trim kerlex to size if a whole kerlex is not required. 4.   Cover wound with a dry ABD pad and secure with tape.  5.   Write the date/time on the dry dressing/tape to better track when the last dressing change occurred. - apply any skin protectant/powder if recommended by clinician to protect skin/skin folds. - change dressing as needed if leakage occurs, wound gets contaminated, or patient requests to shower. - You may shower daily with wound  open and following the shower the wound should be dried and a clean dressing placed.  - Medical grade tape as well as packing supplies can be found at [employer] on [address] or [employer] on [address]. The remaining supplies can be found at your local drug store, [employer] etc.   Increase activity slowly   Complete by: As directed        Discharge Medications   Allergies as of 09/30/2022       Reactions   Celecoxib Anaphylaxis, Swelling, Rash   Tongue swells   Hydrocodone Rash, Other (See Comments)   "blisters developed on arms"   Sulfa Antibiotics Rash   Sulfacetamide Sodium Rash        Medication List     TAKE these medications    acetaminophen 325 MG tablet Commonly known as: TYLENOL Take 2 tablets (650 mg total) by mouth every 6 (six) hours as needed for mild pain (or Fever >/= 101).   amLODipine 10 MG tablet Commonly known as: NORVASC Take 1 tablet (10 mg total) by mouth daily. Start taking on: October 01, 2022   baclofen 10 MG tablet Commonly known as: LIORESAL Take 10 mg by mouth 3 (three) times daily. Sister only gives it to Patient at Bedtime.   carvedilol 6.25 MG tablet Commonly known as: COREG Take 1 tablet (6.25 mg total) by mouth 2 (two) times daily with a meal.   clopidogrel 75 MG tablet Commonly known as: PLAVIX Take 75 mg by mouth daily. Wife called and spoke with nurse.  This is a medication the patient takes at home that was not entered at admission. Dr. Sibyl Parr informed.   DULoxetine 60 MG capsule Commonly known as: CYMBALTA Take 60 mg by mouth daily. Wife called  and spoke with nurse.  This is a medication the patient takes at home that was not entered at admission. Dr. Sibyl Parr informed.   ezetimibe 10 MG tablet Commonly known as: ZETIA Take 1 tablet (10 mg total) by mouth daily. Start taking on: October 01, 2022   HumaLOG KwikPen 200 UNIT/ML KwikPen Generic drug: insulin lispro Inject 20 Units into the skin in the  morning, at noon, and at bedtime. What changed:  how much to take additional instructions   hydroxychloroquine 200 MG tablet Commonly known as: PLAQUENIL Take 200 mg by mouth 2 (two) times daily. Wife called and spoke with nurse.  This is a medication the patient takes at home that was not entered at admission. Dr. Sibyl Parr informed.   hydrOXYzine 25 MG tablet Commonly known as: ATARAX Take 25 mg by mouth at bedtime as needed for anxiety or itching.   lamoTRIgine 150 MG tablet Commonly known as: LAMICTAL Take 150 mg by mouth 2 (two) times daily. Wife called and spoke with nurse.  This is a medication the patient takes at home that was not entered at admission. Dr. Sibyl Parr informed.   linezolid 600 MG tablet Commonly known as: ZYVOX Take 1 tablet (600 mg total) by mouth 2 (two) times daily.   lurasidone 20 MG Tabs tablet Commonly known as: LATUDA Take 40 mg by mouth at bedtime.   nicotine 21 mg/24hr patch Commonly known as: NICODERM CQ - dosed in mg/24 hours Place 1 patch (21 mg total) onto the skin daily.   omeprazole 40 MG capsule Commonly known as: PRILOSEC Take 40 mg by mouth daily.   oxyCODONE 5 MG immediate release tablet Commonly known as: Oxy IR/ROXICODONE Take 1 tablet (5 mg total) by mouth every 12 (twelve) hours as needed for severe pain.   pregabalin 200 MG capsule Commonly known as: LYRICA Take 200 mg by mouth 3 (three) times daily.   rOPINIRole 0.5 MG tablet Commonly known as: REQUIP Take 0.5 mg by mouth at bedtime.   rosuvastatin 20 MG tablet Commonly known as: CRESTOR Take 1 tablet (20 mg total) by mouth at bedtime. What changed:  medication strength how much to take when to take this   tamsulosin 0.4 MG Caps capsule Commonly known as: FLOMAX Take 0.4 mg by mouth at bedtime.   Toujeo Max SoloStar 300 UNIT/ML Solostar Pen Generic drug: insulin glargine (2 Unit Dial) Inject 40 Units into the skin 2 (two) times daily at 8 am and 10 pm. What changed:   how much to take additional instructions   venlafaxine XR 150 MG 24 hr capsule Commonly known as: EFFEXOR-XR Take 150 mg by mouth daily with breakfast. Wife called and spoke with nurse.  This is a medication the patient takes at home that was not entered at admission. Dr. Sibyl Parr informed.               Durable Medical Equipment  (From admission, onward)           Start     Ordered   09/29/22 1424  For home use only DME Bedside commode  Once       Question:  Patient needs a bedside commode to treat with the following condition  Answer:  Septicemia [facility])   09/29/22 1424              Discharge Care Instructions  (From admission, onward)           Start     Ordered   09/30/22 0000  Discharge wound care:       Comments: Wet to Dry WOUND CARE: - Change dressing twice daily - Supplies: sterile saline, kerlex, scissors, ABD pads, tape  1. Remove dressing and all packing carefully, moistening with sterile saline as needed to avoid packing/internal dressing sticking to the wound. 2.   Clean edges of skin around the wound with water/gauze, making sure there is no tape debris or leakage left on skin that could cause skin irritation or breakdown. 3.   Dampen and clean kerlex with sterile saline and pack wound from wound base to skin level, making sure to take note of any possible areas of wound tracking, tunneling and packing appropriately. Wound can be packed loosely. Trim kerlex to size if a whole kerlex is not required. 4.   Cover wound with a dry ABD pad and secure with tape.  5.   Write the date/time on the dry dressing/tape to better track when the last dressing change occurred. - apply any skin protectant/powder if recommended by clinician to protect skin/skin folds. - change dressing as needed if leakage occurs, wound gets contaminated, or patient requests to shower. - You may shower daily with wound open and following the shower the wound should be dried and a clean  dressing placed.  - Medical grade tape as well as packing supplies can be found at [employer] on [address] or [employer] on [address]. The remaining supplies can be found at your local drug store, [employer]mart etc.   09/30/22 0849             Follow-up Information     Maczis, Hedda Slade, PA-C Follow up.   Specialty: General Surgery Why: 10/17/22 at 11 am. Please bring a copy of your photo ID, insurance card and arrive 30 minutes prior to your appointment for paperwork. Contact information: [address] [place] [address] [phone]         [facility] Follow up.   Why: [facility] will resume thier home health services after discharge Contact information: [address] [place] [address] [phone]         Dois Davenport, MD. Schedule an appointment as soon as possible for a visit in 1 week(s).   Specialty: Family Medicine Contact information: [address] [place] [address] [phone]                 Major procedures and Radiology Reports - PLEASE review detailed and final reports thoroughly  -      ECHOCARDIOGRAM COMPLETE BUBBLE STUDY  Result Date: 09/25/2022    ECHOCARDIOGRAM REPORT   Patient Name:   Vincent [name] [facility] Date of Exam: 09/25/2022 Medical Rec #:  [id number]       Height:       72.0 in Accession #:    [id number]      Weight:       197.1 lb Date of Birth:  November 24, 1965        BSA:          2.117 m Patient Age:    57 years        BP:           154/79 mmHg Patient Gender: M               HR:           85 bpm. Exam Location:  Inpatient Procedure: 2D Echo, Cardiac Doppler, Color  Doppler and Saline Contrast Bubble            Study Indications:    Stroke I63.9  History:        Patient has prior history of Echocardiogram examinations, most                 recent 07/19/2022. Risk Factors:Hypertension, Diabetes,                 Dyslipidemia and  Current Smoker.  Sonographer:    Dondra Prader RVT RCS Referring Phys: [id number] CORTNEY E DE LA TORRE IMPRESSIONS  1. Left ventricular ejection fraction, by estimation, is 60 to 65%. The left ventricle has normal function. The left ventricle has no regional wall motion abnormalities. There is mild concentric left ventricular hypertrophy. Left ventricular diastolic parameters were normal.  2. Right ventricular systolic function is normal. The right ventricular size is normal.  3. The mitral valve is normal in structure. Trivial mitral valve regurgitation. No evidence of mitral stenosis.  4. The aortic valve is normal in structure. Aortic valve regurgitation is not visualized. No aortic stenosis is present.  5. The inferior vena cava is normal in size with greater than 50% respiratory variability, suggesting right atrial pressure of 3 mmHg. FINDINGS  Left Ventricle: Left ventricular ejection fraction, by estimation, is 60 to 65%. The left ventricle has normal function. The left ventricle has no regional wall motion abnormalities. The left ventricular internal cavity size was normal in size. There is  mild concentric left ventricular hypertrophy. Left ventricular diastolic parameters were normal. Right Ventricle: The right ventricular size is normal. No increase in right ventricular wall thickness. Right ventricular systolic function is normal. Left Atrium: Left atrial size was normal in size. Right Atrium: Right atrial size was normal in size. Pericardium: There is no evidence of pericardial effusion. Mitral Valve: The mitral valve is normal in structure. Trivial mitral valve regurgitation. No evidence of mitral valve stenosis. Tricuspid Valve: The tricuspid valve is normal in structure. Tricuspid valve regurgitation is trivial. No evidence of tricuspid stenosis. Aortic Valve: The aortic valve is normal in structure. Aortic valve regurgitation is not visualized. No aortic stenosis is present. Aortic valve mean gradient  measures 4.0 mmHg. Aortic valve peak gradient measures 7.1 mmHg. Aortic valve area, by VTI measures 2.98 cm. Pulmonic Valve: The pulmonic valve was normal in structure. Pulmonic valve regurgitation is trivial. No evidence of pulmonic stenosis. Aorta: The aortic root is normal in size and structure. Venous: The inferior vena cava is normal in size with greater than 50% respiratory variability, suggesting right atrial pressure of 3 mmHg. IAS/Shunts: No atrial level shunt detected by color flow Doppler. Agitated saline contrast was given intravenously to evaluate for intracardiac shunting.  LEFT VENTRICLE PLAX 2D LVIDd:         4.90 cm   Diastology LVIDs:         3.20 cm   LV e' medial:    8.16 cm/s LV PW:         1.00 cm   LV E/e' medial:  10.7 LV IVS:        1.10 cm   LV e' lateral:   12.40 cm/s LVOT diam:     2.20 cm   LV E/e' lateral: 7.1 LV SV:         80 LV SV Index:   38 LVOT Area:     3.80 cm  RIGHT VENTRICLE  IVC RV S prime:     14.40 cm/s  IVC diam: 2.20 cm TAPSE (M-mode): 2.8 cm LEFT ATRIUM             Index        RIGHT ATRIUM           Index LA diam:        3.60 cm 1.70 cm/m   RA Area:     10.10 cm LA Vol (A2C):   55.7 ml 26.31 ml/m  RA Volume:   16.60 ml  7.84 ml/m LA Vol (A4C):   41.7 ml 19.69 ml/m LA Biplane Vol: 52.7 ml 24.89 ml/m  AORTIC VALVE                    PULMONIC VALVE AV Area (Vmax):    2.97 cm     PV Vmax:       1.04 m/s AV Area (Vmean):   3.02 cm     PV Peak grad:  4.3 mmHg AV Area (VTI):     2.98 cm AV Vmax:           133.00 cm/s AV Vmean:          89.800 cm/s AV VTI:            0.268 m AV Peak Grad:      7.1 mmHg AV Mean Grad:      4.0 mmHg LVOT Vmax:         104.00 cm/s LVOT Vmean:        71.300 cm/s LVOT VTI:          0.210 m LVOT/AV VTI ratio: 0.78  AORTA Ao Root diam: 3.50 cm Ao Asc diam:  3.50 cm MITRAL VALVE MV Area (PHT): 3.99 cm     SHUNTS MV Decel Time: 190 msec     Systemic VTI:  0.21 m MV E velocity: 87.50 cm/s   Systemic Diam: 2.20 cm MV A velocity:  110.00 cm/s MV E/A ratio:  0.80 Arvilla Meres MD Electronically signed by Arvilla Meres MD Signature Date/Time: 09/25/2022/5:26:30 PM    Final    MR CERVICAL SPINE W WO CONTRAST  Result Date: 09/25/2022 CLINICAL DATA:  Myelopathy, rule out abscess EXAM: MRI CERVICAL SPINE WITHOUT AND WITH CONTRAST TECHNIQUE: Multiplanar and multiecho pulse sequences of the cervical spine, to include the craniocervical junction and cervicothoracic junction, were obtained without and with intravenous contrast. CONTRAST:  9mL GADAVIST GADOBUTROL 1 MMOL/ML IV SOLN COMPARISON:  05/25/2022 FINDINGS: Evaluation is somewhat limited by motion artifact. Alignment: No significant listhesis. Vertebrae: No acute fracture, evidence of discitis, or suspicious osseous lesion. No abnormal enhancement. Cord: Normal signal and morphology.  No abnormal enhancement. Posterior Fossa, vertebral arteries, paraspinal tissues: Negative. Disc levels: C2-C3: No significant disc bulge. No spinal canal stenosis or neuroforaminal narrowing. C3-C4: No significant disc bulge. Facet arthropathy, with fused left facets that are better seen on the prior exam. No spinal canal stenosis or neural foraminal narrowing. C4-C5: No significant disc bulge. No spinal canal stenosis or neuroforaminal narrowing. C5-C6: Mild disc bulge, which indents the ventral thecal sac but does not deform the spinal cord. No spinal canal stenosis or neural foraminal narrowing. C6-C7: Minimal disc bulge. Facet and uncovertebral hypertrophy. No spinal canal stenosis. No neural foraminal narrowing. C7-T1: No significant disc bulge. No spinal canal stenosis or neuroforaminal narrowing. IMPRESSION: 1. No evidence of discitis osteomyelitis or epidural abscess. 2. No spinal canal stenosis or neural foraminal narrowing. Electronically Signed  By: Wiliam Ke M.D.   On: 09/25/2022 03:25   DG Chest 2 View  Result Date: 09/25/2022 CLINICAL DATA:  Stroke EXAM: CHEST - 2 VIEW COMPARISON:   Radiograph 08/25/2022 and CT 09/24/2022 FINDINGS: Stable cardiomediastinal silhouette. Aortic atherosclerotic calcification. Low lung volumes accentuated pulmonary vascularity. No focal consolidation, pleural effusion, or pneumothorax. No displaced rib fractures. IMPRESSION: Low lung volumes without acute cardiopulmonary process. Electronically Signed   By: Minerva Fester M.D.   On: 09/25/2022 00:31   MR BRAIN WO CONTRAST  Result Date: 09/24/2022 CLINICAL DATA:  Headache, neuro deficit EXAM: MRI HEAD WITHOUT CONTRAST TECHNIQUE: Multiplanar, multiecho pulse sequences of the brain and surrounding structures were obtained without intravenous contrast. COMPARISON:  08/27/2022 MRI head, correlation is also made with 09/24/2022 CT head FINDINGS: Evaluation is somewhat limited by motion artifact. Brain: No restricted diffusion to suggest acute or subacute infarct. No acute hemorrhage, mass, mass effect, or midline shift. No hydrocephalus or extra-axial collection. Normal pituitary and craniocervical junction. No hemosiderin deposition to suggest remote hemorrhage. Normal cerebral volume for age. Vascular: Normal arterial flow voids. Skull and upper cervical spine: Normal marrow signal. Sinuses/Orbits: Clear paranasal sinuses. No acute finding in the orbits. Other: The mastoid air cells are well aerated. IMPRESSION: No acute intracranial process. No evidence of acute or subacute infarct. Electronically Signed   By: Wiliam Ke M.D.   On: 09/24/2022 22:59   CT ANGIO HEAD NECK W WO CM  Result Date: 09/24/2022 CLINICAL DATA:  Neuro deficit, acute, stroke suspected. EXAM: CT ANGIOGRAPHY HEAD AND NECK WITH AND WITHOUT CONTRAST TECHNIQUE: Multidetector CT imaging of the head and neck was performed using the standard protocol during bolus administration of intravenous contrast. Multiplanar CT image reconstructions and MIPs were obtained to evaluate the vascular anatomy. Carotid stenosis measurements (when applicable)  are obtained utilizing NASCET criteria, using the distal internal carotid diameter as the denominator. RADIATION DOSE REDUCTION: This exam was performed according to the departmental dose-optimization program which includes automated exposure control, adjustment of the mA and/or kV according to patient size and/or use of iterative reconstruction technique. CONTRAST:  OMNIPAQUE IOHEXOL 350 MG/ML SOLN COMPARISON:  CT angio head and neck 06/10/2020 FINDINGS: CTA NECK FINDINGS Aortic arch: Atherosclerotic calcifications are present at the aortic arch and great vessel origins. No significant stenosis of greater than 50% is present relative to the more distal vessels. Right carotid system: Atherosclerotic changes are noted along the right common carotid artery without significant stenosis. Calcifications are present at the aortic arch and proximal right ICA without significant stenosis. The more distal right ICA is normal. No significant interval change is present. Left carotid system: The left common carotid artery demonstrates atherosclerotic changes without significant stenosis. Calcifications are present at the aortic arch and proximal left ICA without a significant stenosis relative to the more distal vessel. Vertebral arteries: The right vertebral artery is the dominant vessel. Atherosclerotic calcifications are present in the V1 segment. No significant stenosis is present in either vertebral artery in the neck. Skeleton: No acute abnormalities are present. No focal osseous lesions are present. The patient is edentulous. Other neck: Soft tissues the neck are otherwise unremarkable. Salivary glands are within normal limits. Thyroid is normal. No significant adenopathy is present. No focal mucosal or submucosal lesions are present. Upper chest: The lung apices are clear. The thoracic inlet is within normal limits. Review of the MIP images confirms the above findings CTA HEAD FINDINGS Anterior circulation:  Atherosclerotic calcifications are present within the cavernous internal carotid  arteries bilaterally without a significant stenosis relative to the more distal vessels. The A1 and M1 segments are normal. The anterior communicating artery is patent. MCA bifurcations are within normal limits. ACA and MCA branch vessels are normal. No aneurysm is present. Posterior circulation: The right vertebral artery is the dominant vessel. PICA origin is visualized and normal. Scratched at the PICA origins are visualized and normal. The vertebrobasilar junction and basilar artery normal. Both posterior cerebral arteries scratched at the superior cerebellar arteries are patent. Both posterior cerebral arteries originate from the basilar tip. The PCA branch vessels are normal bilaterally. Venous sinuses: The dural sinuses are patent. The straight sinus and deep cerebral veins are intact. Cortical veins are within normal limits. No significant vascular malformation is evident. Review of the MIP images confirms the above findings IMPRESSION: 1. No significant proximal stenosis, aneurysm, or branch vessel occlusion within the Circle of Willis. 2. Stable atherosclerotic calcifications within the cavernous internal carotid arteries without significant stenosis. 3. Stable atherosclerotic changes within the cavernous internal carotid arteries bilaterally without significant stenosis relative to the more distal vessels. 4.  Aortic Atherosclerosis (ICD10-I70.0). Electronically Signed   By: Marin Roberts M.D.   On: 09/24/2022 17:35   CT Angio Chest Aorta W and/or Wo Contrast  Result Date: 09/24/2022 CLINICAL DATA:  Neuro deficit, acute aortic syndrome suspected EXAM: CT ANGIOGRAPHY CHEST, ABDOMEN AND PELVIS TECHNIQUE: Non-contrast CT of the chest was initially obtained. Multidetector CT imaging through the chest, abdomen and pelvis was performed using the standard protocol during bolus administration of intravenous contrast.  Multiplanar reconstructed images and MIPs were obtained and reviewed to evaluate the vascular anatomy. RADIATION DOSE REDUCTION: This exam was performed according to the departmental dose-optimization program which includes automated exposure control, adjustment of the mA and/or kV according to patient size and/or use of iterative reconstruction technique. CONTRAST:  OMNIPAQUE IOHEXOL 350 MG/ML SOLN COMPARISON:  Chest radiograph 08/25/2022; CT abdomen and pelvis 06/21/2022; CTA chest abdomen and pelvis 06/10/2020 FINDINGS: CTA CHEST FINDINGS Cardiovascular: Normal heart size. No pericardial effusion. No acute aortic syndrome. Aortic atherosclerotic and coronary artery atherosclerotic calcification. Mediastinum/Nodes: Unremarkable esophagus. Secretions in the posterior trachea near the thoracic inlet. No thoracic adenopathy. Lungs/Pleura: Lungs are clear. No pleural effusion or pneumothorax. Mild diffuse bronchial wall thickening. Mild mosaic attenuation of the lungs compatible with air trapping. Musculoskeletal: No chest wall abnormality. No acute or significant osseous findings. Review of the MIP images confirms the above findings. CTA ABDOMEN AND PELVIS FINDINGS VASCULAR Aorta: Moderate calcified plaque causes up to mild narrowing near the bifurcation. No aneurysm or dissection. Celiac: Patent without aneurysm or dissection. SMA: Patent without aneurysm or dissection. Renals: Calcified plaque at the origin of the right renal artery causes moderate narrowing. Patent left renal artery. No aneurysm or dissection IMA: Patent. Inflow: Scattered calcified atherosclerotic plaque causing multifocal areas of mild narrowing. No aneurysm or dissection. Veins: Patent portal vein. Review of the MIP images confirms the above findings. NON-VASCULAR Hepatobiliary: Cholecystectomy. Hepatic steatosis. No biliary dilation. Pancreas: Unremarkable. No pancreatic ductal dilatation or surrounding inflammatory changes. Spleen:  Normal in size without focal abnormality. Adrenals/Urinary Tract: Unremarkable adrenal glands and kidneys. No hydronephrosis or urinary calculi. Unremarkable bladder. Stomach/Bowel: Stomach is within normal limits. Normal caliber large and small bowel. Moderate colonic stool load. Normal appendix. Lymphatic: No lymphadenopathy. Reproductive: No acute abnormality. Other: Subcutaneous peripherally enhancing gas and fluid collection along the right posterior gluteal cleft measuring 3.5 x 2.3 cm (6/344). Mild adjacent stranding. No free intraperitoneal fluid or  air. Musculoskeletal: No acute osseous abnormality. Review of the MIP images confirms the above findings. IMPRESSION: 1. No acute aortic syndrome. 2. Subcutaneous peripherally enhancing gas and fluid collection along the right posterior gluteal cleft measuring 3.5 x 2.3 cm, concerning for abscess. 3. Diffuse bronchial wall thickening. Layering secretions in the trachea near the thoracic inlet. Query aspiration. 4. Hepatic steatosis. 5. Moderate stool load.  Correlate for constipation. Aortic Atherosclerosis (ICD10-I70.0). Electronically Signed   By: Minerva Fester M.D.   On: 09/24/2022 17:27   CT HEAD CODE STROKE WO CONTRAST  Result Date: 09/24/2022 CLINICAL DATA:  Code stroke. Neuro deficit, acute, stroke suspected. EXAM: CT HEAD WITHOUT CONTRAST TECHNIQUE: Contiguous axial images were obtained from the base of the skull through the vertex without intravenous contrast. RADIATION DOSE REDUCTION: This exam was performed according to the departmental dose-optimization program which includes automated exposure control, adjustment of the mA and/or kV according to patient size and/or use of iterative reconstruction technique. COMPARISON:  CT head without contrast 07/18/2022. MR head without contrast 08/27/2022 FINDINGS: Brain: No acute infarct, hemorrhage, or mass lesion is present. No significant white matter lesions are present. Deep brain nuclei are within  normal limits. Insular ribbon is normal bilaterally. The ventricles are of normal size. No significant extraaxial fluid collection is present. The brainstem and cerebellum are within normal limits. Midline structures are within normal limits. Vascular: Atherosclerotic calcifications are present within the cavernous internal carotid arteries bilaterally. No hyperdense vessel is present. Skull: Calvarium is intact. No focal lytic or blastic lesions are present. No significant extracranial soft tissue lesion is present. Sinuses/Orbits: The paranasal sinuses and mastoid air cells are clear. The globes and orbits are within normal limits. ASPECTS [facility] Stroke Program Early CT Score) - Ganglionic level infarction (caudate, lentiform nuclei, internal capsule, insula, M1-M3 cortex): 7/7 - Supraganglionic infarction (M4-M6 cortex): 3/3 Total score (0-10 with 10 being normal): 10/10 IMPRESSION: 1. Negative CT of the head. 2. Aspects is 10/10. The above was relayed via text pager to Dr. Otelia Limes on 09/24/2022 at 16:15 . Electronically Signed   By: Marin Roberts M.D.   On: 09/24/2022 16:17    Micro Results    Recent Results (from the past 240 hour(s))  Blood culture (routine x 2)     Status: None   Collection Time: 09/24/22  6:18 PM   Specimen: BLOOD LEFT HAND  Result Value Ref Range Status   Specimen Description BLOOD LEFT HAND  Final   Special Requests   Final    BOTTLES DRAWN AEROBIC AND ANAEROBIC Blood Culture results may not be optimal due to an inadequate volume of blood received in culture bottles   Culture   Final    NO GROWTH 5 DAYS Performed at [facility], [address] [place], [place] [address]    Report Status 09/29/2022 FINAL  Final  Blood culture (routine x 2)     Status: None   Collection Time: 09/24/22  6:34 PM   Specimen: BLOOD LEFT ARM  Result Value Ref Range Status   Specimen Description BLOOD LEFT ARM  Final   Special Requests   Final    BOTTLES DRAWN AEROBIC AND  ANAEROBIC Blood Culture results may not be optimal due to an inadequate volume of blood received in culture bottles   Culture   Final    NO GROWTH 5 DAYS Performed at [facility], [address], [place], [place] [address]    Report Status 09/29/2022 FINAL  Final  MRSA Next Gen by PCR, Nasal     Status: Abnormal   Collection Time: 09/26/22  5:39 AM   Specimen: Nasal Mucosa; Nasal Swab  Result Value Ref Range Status   MRSA by PCR Next Gen DETECTED (A) NOT DETECTED Final    Comment: RESULT CALLED TO, READ BACK BY AND VERIFIED WITH: Newton Pigg RN, AT 1307 09/26/22 D. VANHOOK (NOTE) The GeneXpert MRSA Assay (FDA approved for NASAL specimens only), is one component of a comprehensive MRSA colonization surveillance program. It is not intended to diagnose MRSA infection nor to guide or monitor treatment for MRSA infections. Test performance is not FDA approved in patients less than 53 years old. Performed at [facility], [address], [place], [place] [address]   Aerobic/Anaerobic Culture w Gram Stain (surgical/deep wound)     Status: None (Preliminary result)   Collection Time: 09/27/22 10:16 AM   Specimen: Path fluid; Tissue  Result Value Ref Range Status   Specimen Description ABSCESS  Final   Special Requests NONE  Final   Gram Stain   Final    MODERATE WBC PRESENT, PREDOMINANTLY PMN MODERATE GRAM POSITIVE COCCI IN PAIRS IN CLUSTERS RARE GRAM POSITIVE RODS Performed at [facility], [address] [place], [place] [address]    Culture   Final    MODERATE STAPHYLOCOCCUS AUREUS MODERATE ENTEROCOCCUS FAECALIS SUSCEPTIBILITIES TO FOLLOW NO ANAEROBES ISOLATED; CULTURE IN PROGRESS FOR 5 DAYS    Report Status PENDING  Incomplete    Today   Subjective    Vincent [name] today has no headache,no chest abdominal pain,no new weakness tingling or numbness, feels much better wants to go home today.    Objective   Blood pressure 135/76, pulse 86, temperature  98.1 F (36.7 C), temperature source Oral, resp. rate 17, height 6' (1.829 m), weight 89.4 kg, SpO2 97%.   Intake/Output Summary (Last 24 hours) at 09/30/2022 0849 Last data filed at 09/29/2022 1500 Gross per 24 hour  Intake 480 ml  Output 1200 ml  Net -720 ml    Exam  Awake Alert, No new F.N deficits,    [place].AT,PERRAL Supple Neck,   Symmetrical Chest wall movement, Good air movement bilaterally, CTAB RRR,No Gallops,   +ve B.Sounds, Abd Soft, Non tender,  No Cyanosis, Clubbing or edema    Data Review   Recent Labs  Lab 09/26/22 0225 09/27/22 0540 09/28/22 0435 09/29/22 0055 09/30/22 0402  WBC 4.7 4.7 4.4 5.0 5.5  HGB 11.2* 11.1* 10.5* 11.3* 11.7*  HCT 33.1* 32.4* 30.9* 33.5* 34.7*  PLT 264 250 246 274 288  MCV 83.2 79.4* 83.3 81.9 80.3  MCH 28.1 27.2 28.3 27.6 27.1  MCHC 33.8 34.3 34.0 33.7 33.7  RDW 13.9 13.7 14.0 13.6 13.4  LYMPHSABS 1.4 1.7 2.0 2.3 2.2  MONOABS 0.3 0.4 0.4 0.4 0.4  EOSABS 0.1 0.1 0.1 0.1 0.1  BASOSABS 0.0 0.1 0.0 0.1 0.1    Recent Labs  Lab 09/24/22 1555 09/24/22 1604 09/24/22 1742 09/25/22 0002 09/25/22 0202 09/26/22 0225 09/26/22 0700 09/27/22 0540 09/28/22 0435 09/29/22 0055 09/30/22 0402  NA 139   < >  --   --  136 133*  --  133* 135 136 137  K 4.0   < >  --   --  4.1 4.1  --  3.8 4.0 3.9 3.6  CL 105   < >  --   --  105 104  --  105 104 103 103  CO2 20*  --   --   --  20* 19*  --  20* 22 24 24   ANIONGAP 14  --   --   --  11 10  --  8 9 9 10   GLUCOSE 127*   < >  --   --  152* 294*  --  365* 299* 224* 188*  BUN 20   < >  --   --  21* 21*  --  23* 28* 24* 21*  CREATININE 1.48*   < >  --   --  1.18 1.27*  --  1.35* 1.43* 1.35* 1.01  AST 20  --   --   --  17 15  --   --   --   --   --   ALT 16  --   --   --  17 18  --   --   --   --   --   ALKPHOS 72  --   --   --  65 75  --   --   --   --   --   BILITOT 0.6  --   --   --  0.7 0.6  --   --   --   --   --   ALBUMIN 3.2*  --   --   --  2.7* 2.7*  --   --   --   --   --   CRP  --    --   --   --   --   --  0.9 1.9* 1.8* 1.6* 1.3*  PROCALCITON  --   --   --   --   --   --  <0.10 0.10 0.13 0.10 <0.10  LATICACIDVEN  --   --  1.6  --   --   --   --   --   --   --   --   INR 0.9  --   --   --   --   --   --   --   --   --   --   TSH  --   --   --   --  2.731  --  4.459  --   --   --   --   HGBA1C  --   --   --  12.4*  --   --   --   --   --   --   --   BNP  --   --   --   --   --   --  60.1 56.5 22.2 14.3 12.8  CALCIUM 9.3  --   --   --  8.7* 8.5*  --  8.4* 8.4* 8.7* 8.9   < > = values in this interval not displayed.    Total Time in preparing paper work, data evaluation and todays exam - 35 minutes  Signature  -    Susa Raring M.D on 09/30/2022 at 8:49 AM   -  To page go to [email or web address]

## 2022-09-30 NOTE — TOC Transition Note (Signed)
Transition of Care [facility]) - CM/SW Discharge Note   Patient Details  Name: Vincent [name] MRN: [id number] Date of Birth: [birth year]  [facility]) CM/SW Contact:  Lawerance Sabal, RN Phone Number: 09/30/2022, 9:18 AM   Clinical Narrative:     Notified [facility] that patient will DC to home w [facility] services today.         Patient Goals and CMS Choice      Discharge Placement                         Discharge Plan and Services Additional resources added to the After Visit Summary for                                       Social Determinants of Health (SDOH) Interventions SDOH Screenings   Food Insecurity: No Food Insecurity (07/18/2022)  Housing: Low Risk  (07/18/2022)  Transportation Needs: No Transportation Needs (07/18/2022)  Utilities: Not At Risk (07/18/2022)  Alcohol Screen: Low Risk  (07/31/2021)  Social Connections: Unknown (07/17/2021)   Received from [facility]  Tobacco Use: High Risk (09/27/2022)     Readmission Risk Interventions    05/26/2022    1:26 PM  Readmission Risk Prevention Plan  PCP or Specialist Appt within 3-5 Days Complete  HRI or Home Care Consult Complete  Social Work Consult for Recovery Care Planning/Counseling Complete  Palliative Care Screening Not Applicable  Medication Review [employer]) Complete

## 2022-10-03 LAB — AEROBIC/ANAEROBIC CULTURE W GRAM STAIN (SURGICAL/DEEP WOUND)

## 2022-10-09 ENCOUNTER — Telehealth: Payer: Self-pay

## 2022-10-09 NOTE — Patient Outreach (Signed)
  Emmi Stroke Care Coordination Follow Up  10/09/2022 Name:  Vincent [name] MRN:  [id number] DOB:  1966-02-25  Subjective: Vincent [name] is a 57 y.o. year old male who is a primary care patient of Dois Davenport, MD An Emmi alert was received on 10/08/22 indicating patient responded to questions: Smoked or been around smoke?. I reached out by phone to follow up on the alert.  Care Coordination Interventions:  No, not indicated   Follow up plan:  RN CM will make outreach attempt if no return call from patient.    Encounter Outcome:  No Answer    Antionette Fairy, RN,BSN,CCM [facility] Care Management Care Management Community Coordinator Direct Phone: [phone] Toll Free: [phone] Fax: [phone]

## 2022-10-09 NOTE — Patient Outreach (Signed)
Received a red flag Emmi stroke notification. I have assigned Roshanda Florance, RN to call for follow up and determine if there are any Case Management needs.    Laura Greeson, CBCS, CMAA [facility] Care Management Assistant [facility] Care Management [phone]  

## 2022-10-10 ENCOUNTER — Telehealth: Payer: Self-pay

## 2022-10-10 NOTE — Patient Outreach (Signed)
  Emmi Stroke Care Coordination Follow Up  10/10/2022 Name:  Vincent [name] MRN:  [id number] DOB:  18-Jul-1965  Subjective: Vincent [name] is a 57 y.o. year old male who is a primary care patient of Dois Davenport, MD An Emmi alert was received on 10/08/22 indicating patient responded to questions: Smoked or been around smoke?. I reached out by phone to follow up on the alert.  Care Coordination Interventions:  No, not indicated   Follow up plan:  RN CM will make another outreach attempt to try to reach patient.     Encounter Outcome:  No Answer    Antionette Fairy, RN,BSN,CCM [facility] Care Management Care Management Community Coordinator Direct Phone: [phone] Toll Free: [phone] Fax: [phone]

## 2022-10-11 ENCOUNTER — Telehealth: Payer: Self-pay

## 2022-10-11 NOTE — Patient Outreach (Signed)
  Emmi Stroke Care Coordination Follow Up  10/11/2022 Name:  Vincent [name] MRN:  [id number] DOB:  [birth year]  Subjective: Vincent [name] is a 57 y.o. year old male who is a primary care patient of Dois Davenport, MD An Emmi alert was received  on 10/08/22 indicating patient responded to questions: Smoked or been around smoke?. I reached out by phone to follow up on the alert and spoke to Patient. He voices that he is doing fairly well. Sates he saw PCP for follow up appt on yesterday. Patient endorses that his sister handles his medical appts and has made his appts with other MDs bit he does not know the dates of them. He reports that his sister fills med planner for him as well. Pt voices that wound is looking good and healing according to MD report during visit yesterday. His appetite is good. He is up walking & moving around. HH services in place. Pt is a smoker-not interested in quitting at this time. Denies any RN CM needs or concerns at this time.   Care Coordination Interventions:  Yes, provided  TOC Interventions Today    Flowsheet Row Most Recent Value  TOC Interventions   TOC Interventions Discussed/Reviewed TOC Interventions Discussed, Post discharge activity limitations per provider, Post op wound/incision care, S/S of infection      Interventions Today    Flowsheet Row Most Recent Value  General Interventions   General Interventions Discussed/Reviewed General Interventions Discussed, Doctor Visits  Doctor Visits Discussed/Reviewed Doctor Visits Discussed, PCP, Specialist  PCP/Specialist Visits Compliance with follow-up visit  Education Interventions   Education Provided Provided Education  Provided Verbal Education On Nutrition, Medication, When to see the doctor, Other  Nutrition Interventions   Nutrition Discussed/Reviewed Nutrition Discussed  Pharmacy Interventions   Pharmacy Dicussed/Reviewed Pharmacy Topics Discussed, Medications and their functions  Safety  Interventions   Safety Discussed/Reviewed Safety Discussed, Home Safety  Home Safety Assistive Devices  [pt confirms he was walker and cane in the home-using them to help ambulate-working with therapy as well]       Follow up plan: No further intervention required. Advised patient that they would continue to get automated EMMI-Stroke post discharge calls to assess how they are doing following recent hospitalization and will receive a call from a nurse if any of their responses were abnormal. Patient voiced understanding and was appreciative of f/u call.  Encounter Outcome:  Pt. Visit Completed    Alessandra Grout [facility] Care Management Care Management Community Coordinator Direct Phone: [phone] Toll Free: [phone] Fax: [phone]

## 2022-10-18 ENCOUNTER — Other Ambulatory Visit: Payer: Self-pay | Admitting: Neurology

## 2022-10-24 ENCOUNTER — Other Ambulatory Visit: Payer: Self-pay | Admitting: Neurology

## 2022-10-29 ENCOUNTER — Emergency Department (HOSPITAL_BASED_OUTPATIENT_CLINIC_OR_DEPARTMENT_OTHER)
Admission: EM | Admit: 2022-10-29 | Discharge: 2022-10-29 | Disposition: A | Discharge status: Home / Self Care | Payer: 59 | Attending: Emergency Medicine | Admitting: Emergency Medicine

## 2022-10-29 ENCOUNTER — Encounter (HOSPITAL_BASED_OUTPATIENT_CLINIC_OR_DEPARTMENT_OTHER): Payer: Self-pay

## 2022-10-29 ENCOUNTER — Other Ambulatory Visit: Payer: Self-pay

## 2022-10-29 DIAGNOSIS — Z7902 Long term (current) use of antithrombotics/antiplatelets: Secondary | ICD-10-CM | POA: Insufficient documentation

## 2022-10-29 DIAGNOSIS — R338 Other retention of urine: Secondary | ICD-10-CM

## 2022-10-29 DIAGNOSIS — R339 Retention of urine, unspecified: Secondary | ICD-10-CM | POA: Diagnosis present

## 2022-10-29 LAB — URINALYSIS, ROUTINE W REFLEX MICROSCOPIC
Bacteria, UA: NONE SEEN
Bilirubin Urine: NEGATIVE
Glucose, UA: >1000 mg/dL — AB
Ketones, ur: NEGATIVE mg/dL
Leukocytes,Ua: NEGATIVE
Nitrite: NEGATIVE
Protein, ur: 30 mg/dL — AB
Specific Gravity, Urine: 1.026 (ref 1.005–1.030)
pH: 6.5 (ref 5.0–8.0)

## 2022-10-29 MED ORDER — NYSTATIN 100000 UNIT/GM EX POWD: 1.0000 | Freq: Three times a day (TID) | CUTANEOUS | 0 refills | Status: DC

## 2022-10-29 MED ORDER — LIDOCAINE HCL URETHRAL/MUCOSAL 2 % EX GEL
1.0000 | Freq: Once | CUTANEOUS | Status: AC
  Administered 2022-10-29: 1 via URETHRAL
  Filled 2022-10-29: qty 11

## 2022-10-29 NOTE — Discharge Instructions (Signed)
Please follow up with the urology office.  They will typically see you in a week and try to remove the catheter and see if you can urinate on your own.

## 2022-10-29 NOTE — ED Notes (Signed)
Bladder scan volume:   700 mL  704 mL   904 mL

## 2022-10-29 NOTE — ED Triage Notes (Signed)
Pt states that he has been unable to urinate x2hrs. Hx of bladder cancer x3, enlarged prostate. States he was last able to fully empty bladder approx 11a.

## 2022-10-29 NOTE — ED Provider Notes (Signed)
[place] EMERGENCY DEPARTMENT AT [facility] Provider Note   CSN: [id number] Arrival date & time: 10/29/22  1925     History  Chief Complaint  Patient presents with   Urinary Retention    Vincent [name] is a 57 y.o. male.  57 yo M with a chief complaints of not being able to urinate.  He was told at some point that he had enlarged prostate but over the past week has had difficulty urinating.  He feels he really has to force it to get able to urinate.  Today over the past few hours has not been able to urinate at all.  He started feel really uncomfortable and has some lower abdominal discomfort.  The patient is not well at baseline and tells me that he was recently admitted for a systemic infection.  He also has been struggling with diffuse extremity weakness that is been off and on for at least the past year.  He does have some back pain with this.  No fevers.        Home Medications Prior to Admission medications   Medication Sig Start Date End Date Taking? Authorizing Provider  nystatin (MYCOSTATIN/NYSTOP) powder Apply 1 Application topically 3 (three) times daily. 10/29/22  Yes Melene Plan, DO  acetaminophen (TYLENOL) 325 MG tablet Take 2 tablets (650 mg total) by mouth every 6 (six) hours as needed for mild pain (or Fever >/= 101). 08/29/22   Marguerita Merles Latif, DO  amLODipine (NORVASC) 10 MG tablet Take 1 tablet (10 mg total) by mouth daily. 10/01/22   Leroy Sea, MD  baclofen (LIORESAL) 10 MG tablet Take 10 mg by mouth 3 (three) times daily. Sister only gives it to Patient at Bedtime.    [provider]  carvedilol (COREG) 6.25 MG tablet Take 1 tablet (6.25 mg total) by mouth 2 (two) times daily with a meal. 09/30/22   Leroy Sea, MD  clopidogrel (PLAVIX) 75 MG tablet Take 75 mg by mouth daily. Wife called and spoke with nurse.  This is a medication the patient takes at home that was not entered at admission. Dr. Sibyl Parr informed.    [provider]  DULoxetine (CYMBALTA) 60 MG capsule Take 60 mg by mouth daily. Wife called and spoke with nurse.  This is a medication the patient takes at home that was not entered at admission. Dr. Sibyl Parr informed.    [provider]  ezetimibe (ZETIA) 10 MG tablet Take 1 tablet (10 mg total) by mouth daily. 10/01/22   Leroy Sea, MD  HUMALOG KWIKPEN 200 UNIT/ML KwikPen Inject 20 Units into the skin in the morning, at noon, and at bedtime. Patient taking differently: Inject 35 Units into the skin in the morning, at noon, and at bedtime. Sister stated Dr. Catalina Pizza her 35 units on both Insulin Pens 08/29/22   Marguerita Merles Latif, DO  hydroxychloroquine (PLAQUENIL) 200 MG tablet Take 200 mg by mouth 2 (two) times daily. Wife called and spoke with nurse.  This is a medication the patient takes at home that was not entered at admission. Dr. Sibyl Parr informed.    [provider]  hydrOXYzine (ATARAX) 25 MG tablet Take 25 mg by mouth at bedtime as needed for anxiety or itching. 08/01/22   [provider]  insulin glargine, 2 Unit Dial, (TOUJEO MAX SOLOSTAR) 300 UNIT/ML Solostar Pen Inject 40 Units into the skin 2 (two) times daily at 8 am and 10 pm. Patient taking differently: Inject  35 Units into the skin 2 (two) times daily at 8 am and 10 pm. Sister stated Dr. Has Patient using 35 units on both insulin pens. 08/29/22   Marguerita Merles Latif, DO  lamoTRIgine (LAMICTAL) 150 MG tablet Take 150 mg by mouth 2 (two) times daily. Wife called and spoke with nurse.  This is a medication the patient takes at home that was not entered at admission. Dr. Sibyl Parr informed.    [provider]  linezolid (ZYVOX) 600 MG tablet Take 1 tablet (600 mg total) by mouth 2 (two) times daily. 09/30/22   Leroy Sea, MD  lurasidone (LATUDA) 20 MG TABS tablet Take 40 mg by mouth at bedtime.    [provider]  nicotine (NICODERM CQ - DOSED IN MG/24 HOURS) 21 mg/24hr patch Place 1 patch (21 mg  total) onto the skin daily. 08/30/22   Marguerita Merles Latif, DO  omeprazole (PRILOSEC) 40 MG capsule Take 40 mg by mouth daily. 08/23/21   [provider]  oxyCODONE (OXY IR/ROXICODONE) 5 MG immediate release tablet Take 1 tablet (5 mg total) by mouth every 12 (twelve) hours as needed for severe pain. 09/30/22   Leroy Sea, MD  pregabalin (LYRICA) 200 MG capsule Take 200 mg by mouth 3 (three) times daily. 12/25/21   Dois Davenport, MD  rOPINIRole (REQUIP) 0.5 MG tablet Take 0.5 mg by mouth at bedtime. 07/25/22   [provider]  rosuvastatin (CRESTOR) 20 MG tablet Take 1 tablet (20 mg total) by mouth at bedtime. 09/30/22   Leroy Sea, MD  tamsulosin (FLOMAX) 0.4 MG CAPS capsule Take 0.4 mg by mouth at bedtime.    [provider]  venlafaxine XR (EFFEXOR-XR) 150 MG 24 hr capsule Take 150 mg by mouth daily with breakfast. Wife called and spoke with nurse.  This is a medication the patient takes at home that was not entered at admission. Dr. Sibyl Parr informed.    [provider]      Allergies    Celecoxib, Hydrocodone, Sulfa antibiotics, and Sulfacetamide sodium    Review of Systems   Review of Systems  Physical Exam Updated Vital Signs BP 110/73 (BP Location: Right Arm)   Pulse 88   Temp 98 F (36.7 C) (Oral)   Resp 18   SpO2 100%  Physical Exam Vitals and nursing note reviewed.  Constitutional:      Appearance: He is well-developed.  HENT:     Head: Normocephalic and atraumatic.  Eyes:     Pupils: Pupils are equal, round, and reactive to light.  Neck:     Vascular: No JVD.  Cardiovascular:     Rate and Rhythm: Normal rate and regular rhythm.     Heart sounds: No murmur heard.    No friction rub. No gallop.  Pulmonary:     Effort: No respiratory distress.     Breath sounds: No wheezing.  Abdominal:     General: There is no distension.     Tenderness: There is no abdominal tenderness. There is no guarding or rebound.   Genitourinary:    Comments: Erythema with satellite lesions Musculoskeletal:        General: Normal range of motion.     Cervical back: Normal range of motion and neck supple.  Skin:    Coloration: Skin is not pale.     Findings: No rash.  Neurological:     Mental Status: He is alert and oriented to person, place, and time.  Psychiatric:  Behavior: Behavior normal.     ED Results / Procedures / Treatments   Labs (all labs ordered are listed, but only abnormal results are displayed) Labs Reviewed  URINALYSIS, ROUTINE W REFLEX MICROSCOPIC - Abnormal; Notable for the following components:      Result Value   Glucose, UA >1,000 (*)    Hgb urine dipstick SMALL (*)    Protein, ur 30 (*)    All other components within normal limits    EKG None  Radiology No results found.  Procedures Procedures    Medications Ordered in ED Medications  lidocaine (XYLOCAINE) 2 % jelly 1 Application (1 Application Urethral Given 10/29/22 2003)    ED Course/ Medical Decision Making/ A&P                                 Medical Decision Making Amount and/or Complexity of Data Reviewed Labs: ordered.  Risk Prescription drug management.   57 yo M with a chief complaints of acute urinary retention.  Patient feels significantly better after Foley catheter placement.  UA negative for infection.  The patient has a history of chronic back pain and has suffered from extremity weakness over at least the past year.  It seems unlikely that this is acute cauda equina syndrome.  On my record review the patient has had issues with neuropathy and has been seen by neurology as well as neurosurgery and has had nerve conduction studies most consistent with a diabetic neuropathy.  Will have him follow-up with his family doctor in the office.  Encouraged him to follow-up with urology who he tells me is an appointment with tomorrow.   9:27 PM:  I have discussed the diagnosis/risks/treatment options  with the patient and family.  Evaluation and diagnostic testing in the emergency department does not suggest an emergent condition requiring admission or immediate intervention beyond what has been performed at this time.  They will follow up with Urology. We also discussed returning to the ED immediately if new or worsening sx occur. We discussed the sx which are most concerning (e.g., sudden worsening pain, fever, inability to tolerate by mouth) that necessitate immediate return. Medications administered to the patient during their visit and any new prescriptions provided to the patient are listed below.  Medications given during this visit Medications  lidocaine (XYLOCAINE) 2 % jelly 1 Application (1 Application Urethral Given 10/29/22 2003)     The patient appears reasonably screen and/or stabilized for discharge and I doubt any other medical condition or other [facility] requiring further screening, evaluation, or treatment in the ED at this time prior to discharge.         Final Clinical Impression(s) / ED Diagnoses Final diagnoses:  Acute urinary retention    Rx / DC Orders ED Discharge Orders          Ordered    nystatin (MYCOSTATIN/NYSTOP) powder  3 times daily        10/29/22 2108              Melene Plan, DO 10/29/22 2127

## 2022-11-09 ENCOUNTER — Other Ambulatory Visit (HOSPITAL_COMMUNITY): Payer: Self-pay | Admitting: Urology

## 2022-11-09 DIAGNOSIS — N312 Flaccid neuropathic bladder, not elsewhere classified: Secondary | ICD-10-CM

## 2022-12-25 ENCOUNTER — Other Ambulatory Visit: Payer: Self-pay | Admitting: Student

## 2022-12-25 DIAGNOSIS — D494 Neoplasm of unspecified behavior of bladder: Secondary | ICD-10-CM

## 2022-12-26 ENCOUNTER — Other Ambulatory Visit: Payer: Self-pay

## 2022-12-26 ENCOUNTER — Encounter (HOSPITAL_COMMUNITY): Payer: Self-pay

## 2022-12-26 ENCOUNTER — Ambulatory Visit (HOSPITAL_COMMUNITY)
Admission: RE | Admit: 2022-12-26 | Discharge: 2022-12-26 | Disposition: A | Discharge status: Home / Self Care | Payer: 59 | Source: Ambulatory Visit | Attending: Urology | Admitting: Urology

## 2022-12-26 DIAGNOSIS — N312 Flaccid neuropathic bladder, not elsewhere classified: Secondary | ICD-10-CM

## 2022-12-26 DIAGNOSIS — Z9151 Personal history of suicidal behavior: Secondary | ICD-10-CM | POA: Diagnosis not present

## 2022-12-26 DIAGNOSIS — F4310 Post-traumatic stress disorder, unspecified: Secondary | ICD-10-CM | POA: Insufficient documentation

## 2022-12-26 DIAGNOSIS — I2510 Atherosclerotic heart disease of native coronary artery without angina pectoris: Secondary | ICD-10-CM | POA: Insufficient documentation

## 2022-12-26 DIAGNOSIS — Z794 Long term (current) use of insulin: Secondary | ICD-10-CM | POA: Diagnosis not present

## 2022-12-26 DIAGNOSIS — F17210 Nicotine dependence, cigarettes, uncomplicated: Secondary | ICD-10-CM | POA: Insufficient documentation

## 2022-12-26 DIAGNOSIS — D494 Neoplasm of unspecified behavior of bladder: Secondary | ICD-10-CM

## 2022-12-26 DIAGNOSIS — C679 Malignant neoplasm of bladder, unspecified: Secondary | ICD-10-CM | POA: Insufficient documentation

## 2022-12-26 DIAGNOSIS — R292 Abnormal reflex: Secondary | ICD-10-CM | POA: Insufficient documentation

## 2022-12-26 DIAGNOSIS — E1140 Type 2 diabetes mellitus with diabetic neuropathy, unspecified: Secondary | ICD-10-CM | POA: Diagnosis not present

## 2022-12-26 DIAGNOSIS — K3184 Gastroparesis: Secondary | ICD-10-CM | POA: Diagnosis not present

## 2022-12-26 DIAGNOSIS — J449 Chronic obstructive pulmonary disease, unspecified: Secondary | ICD-10-CM | POA: Diagnosis present

## 2022-12-26 LAB — GLUCOSE, CAPILLARY
Glucose-Capillary: 444 mg/dL — ABNORMAL HIGH (ref 70–99)
Glucose-Capillary: 506 mg/dL (ref 70–99)
Glucose-Capillary: 429 mg/dL — ABNORMAL HIGH (ref 70–99)

## 2022-12-26 LAB — CBC
HCT: 39.3 % (ref 39.0–52.0)
Hemoglobin: 13.1 g/dL (ref 13.0–17.0)
MCH: 26.9 pg (ref 26.0–34.0)
MCHC: 33.3 g/dL (ref 30.0–36.0)
MCV: 80.7 fL (ref 80.0–100.0)
Platelets: 238 10*3/uL (ref 150–400)
RBC: 4.87 MIL/uL (ref 4.22–5.81)
RDW: 12.1 % (ref 11.5–15.5)
WBC: 8.7 10*3/uL (ref 4.0–10.5)
nRBC: 0 % (ref 0.0–0.2)

## 2022-12-26 LAB — PROTIME-INR
INR: 0.9 (ref 0.8–1.2)
Prothrombin Time: 12.7 s (ref 11.4–15.2)

## 2022-12-26 MED ORDER — MIDAZOLAM HCL 2 MG/2ML IJ SOLN
INTRAMUSCULAR | Status: AC | PRN
Start: 2022-12-26 — End: 2022-12-26
  Administered 2022-12-26 (×3): 1 mg via INTRAVENOUS

## 2022-12-26 MED ORDER — FENTANYL CITRATE (PF) 100 MCG/2ML IJ SOLN
INTRAMUSCULAR | Status: AC | PRN
Start: 2022-12-26 — End: 2022-12-26
  Administered 2022-12-26 (×3): 50 ug via INTRAVENOUS

## 2022-12-26 MED ORDER — LIDOCAINE-EPINEPHRINE 1 %-1:100000 IJ SOLN
10.0000 mL | Freq: Once | INTRAMUSCULAR | Status: AC
  Administered 2022-12-26: 10 mL

## 2022-12-26 MED ORDER — SODIUM CHLORIDE 0.9 % IV SOLN: INTRAVENOUS | Status: DC

## 2022-12-26 MED ORDER — MIDAZOLAM HCL 2 MG/2ML IJ SOLN
INTRAMUSCULAR | Status: AC
  Filled 2022-12-26: qty 4

## 2022-12-26 MED ORDER — FENTANYL CITRATE (PF) 100 MCG/2ML IJ SOLN
INTRAMUSCULAR | Status: AC
  Filled 2022-12-26: qty 4

## 2022-12-26 NOTE — Progress Notes (Addendum)
Patient and sister was given discharge instructions. Both verbalized understanding.

## 2022-12-26 NOTE — Procedures (Signed)
Interventional Radiology Procedure Note  Procedure: Image guided supra-pubic drain placement.  19F pigtail drain.  Complications: None  EBL: None    Recommendations: - Routine care, to gravity.  Empty as needed - 1 hr dc home - advance diet - routine wound care - ok for upsize with moderate sedation in 4-6 weeks to catheter - ok to remove foley catheter in recovery  Signed,  Yvone Neu. Loreta Ave, DO, ABVM, RPVI

## 2022-12-26 NOTE — H&P (Signed)
Chief Complaint: Patient was seen in consultation today for supra pubic catheter placement at the request of Gay,Matthew R   Supervising Physician: Gilmer Mor  Patient Status: [facility] - Out-pt  History of Present Illness: Vincent [name] is a 57 y.o. male   FULL Code status per pt Bipolar; Bladder cancer; CAD; COPD Gastroparesis; PTSD Pt with diabetic neuropathy for years Areflexic bladder Existing foley catheter in place  Dr Cardell Peach has requested supra pubic catheter placement LD Plavix 1 week ago  Past Medical History:  Diagnosis Date   Anxiety    Benign essential tremor    Bipolar 2 disorder (HCC)    followed by [facility]--- dr s. Lolly Mustache   Bladder cancer [facility])    recurrent   CAD (coronary artery disease)    cardiac cath 2003  and 2011 both showed normal coronary arteries w/ preserved lvf;  Non obstructive on CTA Oct 2019.    Chronic pain syndrome    back---- [facility] [place]   Cold extremities    BLE   COPD (chronic obstructive pulmonary disease) (HCC)    DDD (degenerative disc disease), lumbar    Diabetic peripheral neuropathy (HCC)    Gastroparesis    followed by dr Marina Goodell   GERD (gastroesophageal reflux disease)    Hiatal hernia    History of bladder cancer urologist-  previously dr Ronal Fear;  now dr gay   papillay TCC (Ta G1)  s/p TURBT and chemo instillation 2014   History of chest pain 12/2017   heart cath normal   History of encephalopathy 05/27/2015   admission w/ acute encephalopathy thought to be secondary to pain meds and COPD   History of gastric ulcer    History of Helicobacter pylori infection    History of kidney stones    History of TIA (transient ischemic attack) 2008  and 10-19-2018    no residual's   History of traumatic head injury 2010   w/ LOC  per pt needed stitches, hit in head with a mower blade   Hyperlipidemia    Hypertension    Hypogonadism male    s/p  bilateral orchiectomy   Hypothyroidism    Insomnia     Mild obstructive sleep apnea    study in epic 12-04-2016, no cpap   PTSD (post-traumatic stress disorder)    chronic   PTSD (post-traumatic stress disorder)    RA (rheumatoid arthritis) (HCC)    followed by [facility]   Seizures, transient [facility]) neurologist-  dr Terrace Arabia--  differential dx complex partial seizure .vs.  mood disorder .vs.  pseudoseizure--  negative EEG's   confusion episodes and staring spells since 11/ 2015   (03-26-2020 per pt wife last seizure 10 /2021)   Suicide attempt by drug overdose (HCC) 07/28/2021   Transient confusion NEUOROLOGIST-  DR Terrace Arabia   Episodes since 11/ 2015--  neurologist dx  differential complex partial seizure  .vs. mood disorder . vs. pseudoseizure   Type 2 diabetes mellitus treated with insulin [facility])    endocrinologist--- dr Everardo All---  (03-26-2020 pt does not check blood sugar at home)    Past Surgical History:  Procedure Laterality Date   AMPUTATION Left 04/28/2020   Procedure: LEFT LITTLE FINGER AMPUTATION;  Surgeon: Nadara Mustard, MD;  Location: [facility] OR;  Service: Orthopedics;  Laterality: Left;   CARDIAC CATHETERIZATION  12-27-2001  DR Jacinto Halim  &  05-26-2009  DR Eldridge Dace   RESULTS FOR BOTH ARE NORMAL CORONARIES AND PERSERVED LVF/  EF 60%   CARPAL TUNNEL RELEASE Bilateral right 09-16-2003;  left ?   CARPAL TUNNEL RELEASE Left 02/25/2015   Procedure: LEFT CARPAL TUNNEL RELEASE;  Surgeon: Betha Loa, MD;  Location: [facility];  Service: Orthopedics;  Laterality: Left;   CYSTOSCOPY N/A 10/10/2012   Procedure: CYSTOSCOPY CLOT EVACUATION FULGERATION OF BLEEDERS ;  Surgeon: Garnett Farm, MD;  Location: [facility];  Service: Urology;  Laterality: N/A;   CYSTOSCOPY WITH BIOPSY N/A 11/26/2015   Procedure: CYSTOSCOPY WITH BIOPSY AND FULGURATION;  Surgeon: Ihor Gully, MD;  Location[facility];  Service: Urology;  Laterality: N/A;   ESOPHAGOGASTRODUODENOSCOPY  2014   IRRIGATION AND DEBRIDEMENT ABSCESS  Right 09/27/2022   Procedure: IRRIGATION AND DEBRIDEMENT OF RIGHT BUTTOCK  ABSCESS;  Surgeon: Griselda Miner, MD;  Location: [facility] OR;  Service: General;  Laterality: Right;   LAPAROSCOPIC CHOLECYSTECTOMY  11-17-2010   ORCHIECTOMY Right 02/21/2016   Procedure: SCROTAL ORCHIECTOMY with TESTICULAR PROSTHESIS IMPLANT;  Surgeon: Ihor Gully, MD;  Location: [facility] ;  Service: Urology;  Laterality: Right;   ORCHIECTOMY Left 09/02/2018   Procedure: ORCHIECTOMY;  Surgeon: Ihor Gully, MD;  Location: [facility];  Service: Urology;  Laterality: Left;   ROTATOR CUFF REPAIR Right 12/2004   TRANSURETHRAL RESECTION OF BLADDER TUMOR N/A 08/09/2012   Procedure: TRANSURETHRAL RESECTION OF BLADDER TUMOR (TURBT) WITH GYRUS WITH MITOMYCIN C;  Surgeon: Garnett Farm, MD;  Location[facility];  Service: Urology;  Laterality: N/A;   TRANSURETHRAL RESECTION OF BLADDER TUMOR N/A 03/29/2020   Procedure: TRANSURETHRAL RESECTION OF BLADDER TUMOR (TURBT) and post-op instillation of gemcitabine;  Surgeon: [name], MD;  Location: [facility];  Service: Urology;  Laterality: N/A;   TRANSURETHRAL RESECTION OF BLADDER TUMOR WITH GYRUS (TURBT-GYRUS) N/A 02/27/2014   Procedure: TRANSURETHRAL RESECTION OF BLADDER TUMOR WITH GYRUS (TURBT-GYRUS);  Surgeon: Garnett Farm, MD;  Location: [facility];  Service: Urology;  Laterality: N/A;    Allergies: Celecoxib, Hydrocodone, Sulfa antibiotics, and Sulfacetamide sodium  Medications: Prior to Admission medications   Medication Sig Start Date End Date Taking? Authorizing Provider  amLODipine (NORVASC) 10 MG tablet Take 1 tablet (10 mg total) by mouth daily. 10/01/22  Yes Leroy Sea, MD  baclofen (LIORESAL) 10 MG tablet Take 10 mg by mouth 3 (three) times daily. Sister only gives it to Patient at Bedtime.   Yes [provider]  carvedilol (COREG) 6.25 MG tablet Take 1 tablet (6.25 mg  total) by mouth 2 (two) times daily with a meal. 09/30/22  Yes Leroy Sea, MD  HUMALOG KWIKPEN 200 UNIT/ML KwikPen Inject 20 Units into the skin in the morning, at noon, and at bedtime. Patient taking differently: Inject 35 Units into the skin in the morning, at noon, and at bedtime. Sister stated Dr. Catalina Pizza her 35 units on both Insulin Pens 08/29/22  Yes Sheikh, Omair Latif, DO  hydroxychloroquine (PLAQUENIL) 200 MG tablet Take 200 mg by mouth 2 (two) times daily. Wife called and spoke with nurse.  This is a medication the patient takes at home that was not entered at admission. Dr. Sibyl Parr informed.   Yes [provider]  hydrOXYzine (ATARAX) 25 MG tablet Take 25 mg by mouth at bedtime as needed for anxiety or itching. 08/01/22  Yes [provider]  insulin glargine, 2 Unit Dial, (TOUJEO MAX SOLOSTAR) 300 UNIT/ML Solostar Pen Inject 40 Units into the skin 2 (two) times daily  at 8 am and 10 pm. Patient taking differently: Inject 35 Units into the skin 2 (two) times daily at 8 am and 10 pm. Sister stated Dr. Has Patient using 35 units on both insulin pens. 08/29/22  Yes Sheikh, Omair Latif, DO  omeprazole (PRILOSEC) 40 MG capsule Take 40 mg by mouth daily. 08/23/21  Yes [provider]  pregabalin (LYRICA) 200 MG capsule Take 200 mg by mouth 3 (three) times daily. 12/25/21  Yes Dois Davenport, MD  rOPINIRole (REQUIP) 0.5 MG tablet Take 0.5 mg by mouth at bedtime. 07/25/22  Yes [provider]  rosuvastatin (CRESTOR) 20 MG tablet Take 1 tablet (20 mg total) by mouth at bedtime. 09/30/22  Yes Leroy Sea, MD  acetaminophen (TYLENOL) 325 MG tablet Take 2 tablets (650 mg total) by mouth every 6 (six) hours as needed for mild pain (or Fever >/= 101). 08/29/22   Marguerita Merles Latif, DO  clopidogrel (PLAVIX) 75 MG tablet Take 75 mg by mouth daily. Wife called and spoke with nurse.  This is a medication the patient takes at home that was not entered at admission. Dr. Sibyl Parr  informed.    [provider]  DULoxetine (CYMBALTA) 60 MG capsule Take 60 mg by mouth daily. Wife called and spoke with nurse.  This is a medication the patient takes at home that was not entered at admission. Dr. Sibyl Parr informed.    [provider]  ezetimibe (ZETIA) 10 MG tablet Take 1 tablet (10 mg total) by mouth daily. 10/01/22   Leroy Sea, MD  lamoTRIgine (LAMICTAL) 150 MG tablet Take 150 mg by mouth 2 (two) times daily. Wife called and spoke with nurse.  This is a medication the patient takes at home that was not entered at admission. Dr. Sibyl Parr informed.    [provider]  linezolid (ZYVOX) 600 MG tablet Take 1 tablet (600 mg total) by mouth 2 (two) times daily. 09/30/22   Leroy Sea, MD  lurasidone (LATUDA) 20 MG TABS tablet Take 40 mg by mouth at bedtime.    [provider]  nicotine (NICODERM CQ - DOSED IN MG/24 HOURS) 21 mg/24hr patch Place 1 patch (21 mg total) onto the skin daily. 08/30/22   Marguerita Merles Latif, DO  nystatin (MYCOSTATIN/NYSTOP) powder Apply 1 Application topically 3 (three) times daily. 10/29/22   Melene Plan, DO  oxyCODONE (OXY IR/ROXICODONE) 5 MG immediate release tablet Take 1 tablet (5 mg total) by mouth every 12 (twelve) hours as needed for severe pain. 09/30/22   Leroy Sea, MD  tamsulosin (FLOMAX) 0.4 MG CAPS capsule Take 0.4 mg by mouth at bedtime.    [provider]  venlafaxine XR (EFFEXOR-XR) 150 MG 24 hr capsule Take 150 mg by mouth daily with breakfast. Wife called and spoke with nurse.  This is a medication the patient takes at home that was not entered at admission. Dr. Sibyl Parr informed.    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   Left-handed.   3-4 cups caffeine per day.   Social Determinants of Health   Financial Resource Strain: Not on file  Food Insecurity: No Food Insecurity (07/18/2022)   Hunger Vital Sign    Worried About Running Out of Food in the Last Year: Never true    Ran Out of Food in the Last Year: Never true  Transportation Needs: No Transportation Needs (07/18/2022)   PRAPARE - Administrator, Civil Service (Medical): No    Lack of Transportation (Non-Medical): No  Physical Activity: Not on file  Stress: Not on file  Social Connections: Unknown (07/17/2021)   [facility]   Social Network    Social Network: Not on file    Review of Systems: A 12 point ROS discussed and pertinent positives are indicated in the HPI above.  All other systems are negative.  Review of Systems  Constitutional:  Negative for activity change and fever.  Gastrointestinal:  Negative for abdominal pain.  Neurological:  Positive for weakness.  Psychiatric/Behavioral:  Negative for behavioral problems and confusion.     Vital Signs: BP 126/73   Pulse 87   Temp 97.7 F (36.5 C) (Oral)   Ht 6\' 4"  (1.93 m)    Wt 185 lb (83.9 kg)   SpO2 100%   BMI 22.52 kg/m     Physical Exam Vitals reviewed.  HENT:     Mouth/Throat:     Mouth: Mucous membranes are moist.  Cardiovascular:     Rate and Rhythm: Normal rate and regular rhythm.     Heart sounds: Normal heart sounds.  Pulmonary:     Effort: Pulmonary effort is normal.     Breath sounds: Wheezing present.  Abdominal:     Palpations: Abdomen is soft.     Tenderness: There is no abdominal tenderness.  Musculoskeletal:        General: Normal range of motion.  Skin:    General: Skin is warm.  Neurological:     Mental Status: He is alert and oriented to person, place, and time.  Psychiatric:        Behavior: Behavior normal.     Comments: Sister at bedside     Imaging: No results found.  Labs:  CBC: Recent Labs    09/28/22 0435 09/29/22 0055 09/30/22 0402 12/26/22 0931  WBC 4.4 5.0 5.5 8.7  HGB 10.5* 11.3* 11.7* 13.1  HCT 30.9* 33.5* 34.7* 39.3  PLT 246 274 288 238    COAGS: Recent Labs    02/24/22 1826 08/25/22 2137 09/24/22 1555 12/26/22 0931  INR 1.0 1.0 0.9 0.9  APTT  --  30 30  --     BMP: Recent Labs    09/27/22 0540 09/28/22 0435 09/29/22 0055 09/30/22 0402  NA 133* 135 136 137  K 3.8 4.0 3.9 3.6  CL 105 104 103 103  CO2 20* 22 24 24   GLUCOSE 365* 299* 224* 188*  BUN 23* 28* 24* 21*  CALCIUM 8.4* 8.4* 8.7* 8.9  CREATININE 1.35* 1.43* 1.35* 1.01  GFRNONAA >60 58* >60 >60    LIVER FUNCTION TESTS: Recent Labs    08/29/22 0045 09/24/22 1555 09/25/22 0202 09/26/22 0225  BILITOT 0.4 0.6 0.7 0.6  AST 17 20 17 15   ALT 14 16 17 18   ALKPHOS 59 72 65 75  PROT 5.5* 6.3* 5.0* 5.3*  ALBUMIN 2.7* 3.2* 2.7* 2.7*    TUMOR MARKERS: No results for input(s): "AFPTM", "CEA", "CA199", "CHROMGRNA" in the last 8760 hours.  Assessment and Plan:  Scheduled for supra pubic catheter placement Pt is aware of procedure benefits and risks including but not limited to Infection; bleeding; damage to  surrounding structures Agreeable to proceed Consent signed and in chart   Thank you for this interesting consult.  I greatly enjoyed meeting Ivan Lacher [facility] and look forward to participating in their care.  A copy of this report was sent to the requesting provider on this date.  Electronically Signed: Robet Leu, PA-C 12/26/2022, 10:46 AM   I spent a total of  30 Minutes   in face to face in clinical consultation, greater than 50% of which was counseling/coordinating care for supra pubic catheter placement

## 2023-01-18 ENCOUNTER — Inpatient Hospital Stay (HOSPITAL_COMMUNITY)
Admission: EM | Admit: 2023-01-18 | Discharge: 2023-01-25 | DRG: 699 | Disposition: A | Discharge status: Skilled Nursing Facility | Payer: 59 | Attending: Student | Admitting: Student

## 2023-01-18 ENCOUNTER — Encounter (HOSPITAL_COMMUNITY): Payer: Self-pay

## 2023-01-18 ENCOUNTER — Emergency Department (HOSPITAL_COMMUNITY): Payer: 59

## 2023-01-18 ENCOUNTER — Other Ambulatory Visit: Payer: Self-pay

## 2023-01-18 DIAGNOSIS — F3181 Bipolar II disorder: Secondary | ICD-10-CM | POA: Diagnosis present

## 2023-01-18 DIAGNOSIS — E1143 Type 2 diabetes mellitus with diabetic autonomic (poly)neuropathy: Secondary | ICD-10-CM | POA: Diagnosis present

## 2023-01-18 DIAGNOSIS — Z1611 Resistance to penicillins: Secondary | ICD-10-CM | POA: Diagnosis present

## 2023-01-18 DIAGNOSIS — Y828 Other medical devices associated with adverse incidents: Secondary | ICD-10-CM | POA: Diagnosis present

## 2023-01-18 DIAGNOSIS — K3184 Gastroparesis: Secondary | ICD-10-CM | POA: Diagnosis present

## 2023-01-18 DIAGNOSIS — J449 Chronic obstructive pulmonary disease, unspecified: Secondary | ICD-10-CM | POA: Diagnosis present

## 2023-01-18 DIAGNOSIS — F40240 Claustrophobia: Secondary | ICD-10-CM | POA: Diagnosis present

## 2023-01-18 DIAGNOSIS — G894 Chronic pain syndrome: Secondary | ICD-10-CM | POA: Diagnosis present

## 2023-01-18 DIAGNOSIS — Z885 Allergy status to narcotic agent status: Secondary | ICD-10-CM

## 2023-01-18 DIAGNOSIS — L02211 Cutaneous abscess of abdominal wall: Secondary | ICD-10-CM | POA: Diagnosis present

## 2023-01-18 DIAGNOSIS — L0291 Cutaneous abscess, unspecified: Secondary | ICD-10-CM | POA: Diagnosis present

## 2023-01-18 DIAGNOSIS — R45851 Suicidal ideations: Secondary | ICD-10-CM | POA: Diagnosis present

## 2023-01-18 DIAGNOSIS — Z888 Allergy status to other drugs, medicaments and biological substances status: Secondary | ICD-10-CM

## 2023-01-18 DIAGNOSIS — K219 Gastro-esophageal reflux disease without esophagitis: Secondary | ICD-10-CM | POA: Diagnosis present

## 2023-01-18 DIAGNOSIS — B952 Enterococcus as the cause of diseases classified elsewhere: Secondary | ICD-10-CM | POA: Diagnosis present

## 2023-01-18 DIAGNOSIS — T83510A Infection and inflammatory reaction due to cystostomy catheter, initial encounter: Secondary | ICD-10-CM | POA: Diagnosis present

## 2023-01-18 DIAGNOSIS — N312 Flaccid neuropathic bladder, not elsewhere classified: Secondary | ICD-10-CM | POA: Diagnosis present

## 2023-01-18 DIAGNOSIS — Z833 Family history of diabetes mellitus: Secondary | ICD-10-CM

## 2023-01-18 DIAGNOSIS — Z8249 Family history of ischemic heart disease and other diseases of the circulatory system: Secondary | ICD-10-CM | POA: Diagnosis not present

## 2023-01-18 DIAGNOSIS — Z794 Long term (current) use of insulin: Secondary | ICD-10-CM | POA: Diagnosis not present

## 2023-01-18 DIAGNOSIS — I951 Orthostatic hypotension: Secondary | ICD-10-CM | POA: Diagnosis present

## 2023-01-18 DIAGNOSIS — Z8619 Personal history of other infectious and parasitic diseases: Secondary | ICD-10-CM

## 2023-01-18 DIAGNOSIS — I2510 Atherosclerotic heart disease of native coronary artery without angina pectoris: Secondary | ICD-10-CM | POA: Diagnosis present

## 2023-01-18 DIAGNOSIS — F17210 Nicotine dependence, cigarettes, uncomplicated: Secondary | ICD-10-CM | POA: Diagnosis present

## 2023-01-18 DIAGNOSIS — Z79899 Other long term (current) drug therapy: Secondary | ICD-10-CM

## 2023-01-18 DIAGNOSIS — Z8614 Personal history of Methicillin resistant Staphylococcus aureus infection: Secondary | ICD-10-CM

## 2023-01-18 DIAGNOSIS — Z811 Family history of alcohol abuse and dependence: Secondary | ICD-10-CM

## 2023-01-18 DIAGNOSIS — I10 Essential (primary) hypertension: Secondary | ICD-10-CM | POA: Diagnosis present

## 2023-01-18 DIAGNOSIS — Z8673 Personal history of transient ischemic attack (TIA), and cerebral infarction without residual deficits: Secondary | ICD-10-CM

## 2023-01-18 DIAGNOSIS — E785 Hyperlipidemia, unspecified: Secondary | ICD-10-CM | POA: Diagnosis present

## 2023-01-18 DIAGNOSIS — R339 Retention of urine, unspecified: Secondary | ICD-10-CM | POA: Diagnosis present

## 2023-01-18 DIAGNOSIS — Z9151 Personal history of suicidal behavior: Secondary | ICD-10-CM

## 2023-01-18 DIAGNOSIS — Z8711 Personal history of peptic ulcer disease: Secondary | ICD-10-CM

## 2023-01-18 DIAGNOSIS — Z87898 Personal history of other specified conditions: Secondary | ICD-10-CM

## 2023-01-18 DIAGNOSIS — E039 Hypothyroidism, unspecified: Secondary | ICD-10-CM | POA: Diagnosis present

## 2023-01-18 DIAGNOSIS — Z8551 Personal history of malignant neoplasm of bladder: Secondary | ICD-10-CM

## 2023-01-18 DIAGNOSIS — Z87442 Personal history of urinary calculi: Secondary | ICD-10-CM

## 2023-01-18 DIAGNOSIS — Z7902 Long term (current) use of antithrombotics/antiplatelets: Secondary | ICD-10-CM

## 2023-01-18 DIAGNOSIS — M069 Rheumatoid arthritis, unspecified: Secondary | ICD-10-CM | POA: Diagnosis present

## 2023-01-18 DIAGNOSIS — G250 Essential tremor: Secondary | ICD-10-CM | POA: Diagnosis present

## 2023-01-18 DIAGNOSIS — G40909 Epilepsy, unspecified, not intractable, without status epilepticus: Secondary | ICD-10-CM | POA: Diagnosis present

## 2023-01-18 DIAGNOSIS — B961 Klebsiella pneumoniae [K. pneumoniae] as the cause of diseases classified elsewhere: Secondary | ICD-10-CM | POA: Diagnosis present

## 2023-01-18 DIAGNOSIS — N50819 Testicular pain, unspecified: Secondary | ICD-10-CM | POA: Diagnosis present

## 2023-01-18 DIAGNOSIS — F4310 Post-traumatic stress disorder, unspecified: Secondary | ICD-10-CM | POA: Diagnosis present

## 2023-01-18 DIAGNOSIS — Z89022 Acquired absence of left finger(s): Secondary | ICD-10-CM

## 2023-01-18 DIAGNOSIS — E1165 Type 2 diabetes mellitus with hyperglycemia: Secondary | ICD-10-CM | POA: Diagnosis present

## 2023-01-18 DIAGNOSIS — M51369 Other intervertebral disc degeneration, lumbar region without mention of lumbar back pain or lower extremity pain: Secondary | ICD-10-CM | POA: Diagnosis present

## 2023-01-18 DIAGNOSIS — Z882 Allergy status to sulfonamides status: Secondary | ICD-10-CM

## 2023-01-18 DIAGNOSIS — R531 Weakness: Secondary | ICD-10-CM | POA: Diagnosis present

## 2023-01-18 LAB — COMPREHENSIVE METABOLIC PANEL
ALT: 13 U/L (ref 0–44)
AST: 14 U/L — ABNORMAL LOW (ref 15–41)
Albumin: 3.4 g/dL — ABNORMAL LOW (ref 3.5–5.0)
Alkaline Phosphatase: 114 U/L (ref 38–126)
Anion gap: 9 (ref 5–15)
BUN: 12 mg/dL (ref 6–20)
CO2: 26 mmol/L (ref 22–32)
Calcium: 9.6 mg/dL (ref 8.9–10.3)
Chloride: 103 mmol/L (ref 98–111)
Creatinine, Ser: 1.07 mg/dL (ref 0.61–1.24)
GFR, Estimated: >60 mL/min (ref >60)
Glucose, Bld: 180 mg/dL — ABNORMAL HIGH (ref 70–99)
Potassium: 3.5 mmol/L (ref 3.5–5.1)
Sodium: 138 mmol/L (ref 135–145)
Total Bilirubin: 0.4 mg/dL (ref <1.2)
Total Protein: 7.2 g/dL (ref 6.5–8.1)

## 2023-01-18 LAB — CBC WITH DIFFERENTIAL/PLATELET
Abs Immature Granulocytes: 0.09 10*3/uL — ABNORMAL HIGH (ref 0.00–0.07)
Basophils Absolute: 0.1 10*3/uL (ref 0.0–0.1)
Basophils Relative: 1 %
Eosinophils Absolute: 0.1 10*3/uL (ref 0.0–0.5)
Eosinophils Relative: 1 %
HCT: 38.5 % — ABNORMAL LOW (ref 39.0–52.0)
Hemoglobin: 13.2 g/dL (ref 13.0–17.0)
Immature Granulocytes: 1 %
Lymphocytes Relative: 32 %
Lymphs Abs: 2.6 10*3/uL (ref 0.7–4.0)
MCH: 27.3 pg (ref 26.0–34.0)
MCHC: 34.3 g/dL (ref 30.0–36.0)
MCV: 79.5 fL — ABNORMAL LOW (ref 80.0–100.0)
Monocytes Absolute: 0.7 10*3/uL (ref 0.1–1.0)
Monocytes Relative: 8 %
Neutro Abs: 4.7 10*3/uL (ref 1.7–7.7)
Neutrophils Relative %: 57 %
Platelets: 408 10*3/uL — ABNORMAL HIGH (ref 150–400)
RBC: 4.84 MIL/uL (ref 4.22–5.81)
RDW: 12.4 % (ref 11.5–15.5)
WBC: 8.2 10*3/uL (ref 4.0–10.5)
nRBC: 0 % (ref 0.0–0.2)

## 2023-01-18 LAB — GLUCOSE, CAPILLARY
Glucose-Capillary: 123 mg/dL — ABNORMAL HIGH (ref 70–99)
Glucose-Capillary: 205 mg/dL — ABNORMAL HIGH (ref 70–99)

## 2023-01-18 LAB — CBG MONITORING, ED: Glucose-Capillary: 415 mg/dL — ABNORMAL HIGH (ref 70–99)

## 2023-01-18 MED ORDER — SODIUM CHLORIDE 0.9 % IV SOLN
2.0000 g | Freq: Once | INTRAVENOUS | Status: AC
Start: 2023-01-18 — End: 2023-01-18
  Administered 2023-01-18: 2 g via INTRAVENOUS
  Filled 2023-01-18: qty 20

## 2023-01-18 MED ORDER — DULOXETINE HCL 30 MG PO CPEP: 60.0000 mg | ORAL_CAPSULE | Freq: Every day | ORAL | Status: DC

## 2023-01-18 MED ORDER — SODIUM CHLORIDE 0.9 % IV SOLN: INTRAVENOUS | Status: AC

## 2023-01-18 MED ORDER — INSULIN GLARGINE-YFGN 100 UNIT/ML ~~LOC~~ SOLN
15.0000 [IU] | Freq: Two times a day (BID) | SUBCUTANEOUS | Status: DC
  Administered 2023-01-18 – 2023-01-20 (×4): 15 [IU] via SUBCUTANEOUS
  Filled 2023-01-18 (×5): qty 0.15

## 2023-01-18 MED ORDER — CARVEDILOL 3.125 MG PO TABS: 6.2500 mg | ORAL_TABLET | Freq: Two times a day (BID) | ORAL | Status: DC

## 2023-01-18 MED ORDER — SODIUM CHLORIDE 0.9 % IV BOLUS
1000.0000 mL | Freq: Once | INTRAVENOUS | Status: AC
Start: 2023-01-18 — End: 2023-01-18
  Administered 2023-01-18: 1000 mL via INTRAVENOUS

## 2023-01-18 MED ORDER — VENLAFAXINE HCL ER 75 MG PO CP24
150.0000 mg | ORAL_CAPSULE | Freq: Every day | ORAL | Status: DC
  Administered 2023-01-19 – 2023-01-25 (×7): 150 mg via ORAL
  Filled 2023-01-18 (×7): qty 2

## 2023-01-18 MED ORDER — IOHEXOL 300 MG/ML  SOLN
100.0000 mL | Freq: Once | INTRAMUSCULAR | Status: AC | PRN
  Administered 2023-01-18: 100 mL via INTRAVENOUS

## 2023-01-18 MED ORDER — OXYCODONE HCL 5 MG PO TABS: 5.0000 mg | ORAL_TABLET | Freq: Two times a day (BID) | ORAL | Status: DC | PRN

## 2023-01-18 MED ORDER — PREGABALIN 50 MG PO CAPS
200.0000 mg | ORAL_CAPSULE | Freq: Three times a day (TID) | ORAL | Status: DC
  Administered 2023-01-18 – 2023-01-25 (×20): 200 mg via ORAL
  Filled 2023-01-18 (×20): qty 4

## 2023-01-18 MED ORDER — NICOTINE 21 MG/24HR TD PT24
21.0000 mg | MEDICATED_PATCH | Freq: Every day | TRANSDERMAL | Status: DC | PRN
  Administered 2023-01-21: 21 mg via TRANSDERMAL
  Filled 2023-01-18: qty 1

## 2023-01-18 MED ORDER — HYDROXYZINE HCL 25 MG PO TABS
25.0000 mg | ORAL_TABLET | Freq: Every evening | ORAL | Status: DC | PRN
  Administered 2023-01-18 – 2023-01-21 (×2): 25 mg via ORAL
  Filled 2023-01-18 (×3): qty 1

## 2023-01-18 MED ORDER — AMLODIPINE BESYLATE 5 MG PO TABS
10.0000 mg | ORAL_TABLET | Freq: Every day | ORAL | Status: DC
  Administered 2023-01-19 – 2023-01-24 (×6): 10 mg via ORAL
  Filled 2023-01-18 (×6): qty 2

## 2023-01-18 MED ORDER — INSULIN GLARGINE (2 UNIT DIAL) 300 UNIT/ML ~~LOC~~ SOPN: 15.0000 [IU] | PEN_INJECTOR | Freq: Two times a day (BID) | SUBCUTANEOUS | Status: DC

## 2023-01-18 MED ORDER — SODIUM CHLORIDE 0.9% FLUSH
3.0000 mL | Freq: Two times a day (BID) | INTRAVENOUS | Status: DC
  Administered 2023-01-19 – 2023-01-25 (×13): 3 mL via INTRAVENOUS

## 2023-01-18 MED ORDER — HEPARIN SODIUM (PORCINE) 5000 UNIT/ML IJ SOLN
5000.0000 [IU] | Freq: Three times a day (TID) | INTRAMUSCULAR | Status: DC
  Administered 2023-01-18 – 2023-01-25 (×20): 5000 [IU] via SUBCUTANEOUS
  Filled 2023-01-18 (×20): qty 1

## 2023-01-18 MED ORDER — INFLUENZA VIRUS VACC SPLIT PF (FLUZONE) 0.5 ML IM SUSY: 0.5000 mL | PREFILLED_SYRINGE | INTRAMUSCULAR | Status: DC | PRN

## 2023-01-18 MED ORDER — VANCOMYCIN HCL 1500 MG/300ML IV SOLN
1500.0000 mg | INTRAVENOUS | Status: AC
  Administered 2023-01-18: 1500 mg via INTRAVENOUS
  Filled 2023-01-18: qty 300

## 2023-01-18 MED ORDER — IBUPROFEN 200 MG PO TABS: 600.0000 mg | ORAL_TABLET | Freq: Four times a day (QID) | ORAL | Status: DC | PRN

## 2023-01-18 MED ORDER — LURASIDONE HCL 20 MG PO TABS: 40.0000 mg | ORAL_TABLET | Freq: Every day | ORAL | Status: DC

## 2023-01-18 MED ORDER — LAMOTRIGINE 25 MG PO TABS: 150.0000 mg | ORAL_TABLET | Freq: Two times a day (BID) | ORAL | Status: DC

## 2023-01-18 MED ORDER — PANTOPRAZOLE SODIUM 40 MG PO TBEC
40.0000 mg | DELAYED_RELEASE_TABLET | Freq: Every day | ORAL | Status: DC
Start: 2023-01-19 — End: 2023-01-25
  Administered 2023-01-19 – 2023-01-25 (×7): 40 mg via ORAL
  Filled 2023-01-18 (×7): qty 1

## 2023-01-18 NOTE — H&P (Signed)
HPI  Codylee Patil Salzwedel ZOX:[id number] DOB: 24-Jun-1965 DOA: 01/18/2023  PCP: Dois Davenport, MD   Chief Complaint: Abscess  HPI:  57 wm known poorly controlled diabetes complicated gastroparesis neuropathy-has been diabetic since his early 63s Anxiety disorder Bipolar 2 with prior suicidal attempt Chronic pain syndrome Reflux Prior stroke status post tPA 05/2020--he has been admitted several times for focal neurological deficits with hyperglycemia additionally-at last visit he was placed on Plavix alone COPD Previous seizures on Lamictal Previous bladder cancer Benign essential tremor  Recent right gluteal cleft abscess status post incision and drainage-underwent general surgery evaluation 09/27/2022 and was discharged on 09/30/2022  Brought in from home pain discharge around suprapubic cath that was placed on 10/15 [had been on cefdinir ]by IR endorsing 6/10 pain with CBGs >500 WBC 8.2 hemoglobin 13 platelet 408 BUN/creatinine 12/1.0, sodium 138  General Surgery, urology consulted  CT abdomen pelvis returned showing 6.7 X3.7 rim-enhancing fluid gas collection along the belly of the rectus abdominis superiorly surrounding the suprapubic catheter   Review of Systems:  Denies any dark stool tarry stool weakness on one side of body chest pain, shortness of breath, blurred vision double vision rash   ED Course: Given ceftriaxone vancomycin bolused fluids 1 L and started on antibiotics    Past Medical History:  Diagnosis Date   Anxiety    Benign essential tremor    Bipolar 2 disorder (HCC)    followed by [facility]--- dr s. Lolly Mustache   Bladder cancer [facility])    recurrent   CAD (coronary artery disease)    cardiac cath 2003  and 2011 both showed normal coronary arteries w/ preserved lvf;  Non obstructive on CTA Oct 2019.    Chronic pain syndrome    back---- [facility] [place]   Cold extremities    BLE   COPD (chronic obstructive pulmonary disease) (HCC)    DDD  (degenerative disc disease), lumbar    Diabetic peripheral neuropathy (HCC)    Gastroparesis    followed by dr Marina Goodell   GERD (gastroesophageal reflux disease)    Hiatal hernia    History of bladder cancer urologist-  previously dr Ronal Fear;  now dr gay   papillay TCC (Ta G1)  s/p TURBT and chemo instillation 2014   History of chest pain 12/2017   heart cath normal   History of encephalopathy 05/27/2015   admission w/ acute encephalopathy thought to be secondary to pain meds and COPD   History of gastric ulcer    History of Helicobacter pylori infection    History of kidney stones    History of TIA (transient ischemic attack) 2008  and 10-19-2018    no residual's   History of traumatic head injury 2010   w/ LOC  per pt needed stitches, hit in head with a mower blade   Hyperlipidemia    Hypertension    Hypogonadism male    s/p  bilateral orchiectomy   Hypothyroidism    Insomnia    Mild obstructive sleep apnea    study in epic 12-04-2016, no cpap   PTSD (post-traumatic stress disorder)    chronic   PTSD (post-traumatic stress disorder)    RA (rheumatoid arthritis) (HCC)    followed by [facility]   Seizures, transient [facility]) neurologist-  dr Terrace Arabia--  differential dx complex partial seizure .vs.  mood disorder .vs.  pseudoseizure--  negative EEG's   confusion episodes and staring spells since 11/ 2015   (03-26-2020 per pt  wife last seizure 10 /2021)   Suicide attempt by drug overdose (HCC) 07/28/2021   Transient confusion NEUOROLOGIST-  DR Terrace Arabia   Episodes since 11/ 2015--  neurologist dx  differential complex partial seizure  .vs. mood disorder . vs. pseudoseizure   Type 2 diabetes mellitus treated with insulin [facility])    endocrinologist--- dr Everardo All---  (03-26-2020 pt does not check blood sugar at home)   Past Surgical History:  Procedure Laterality Date   AMPUTATION Left 04/28/2020   Procedure: LEFT LITTLE FINGER AMPUTATION;  Surgeon: Nadara Mustard, MD;  Location: [facility] OR;   Service: Orthopedics;  Laterality: Left;   CARDIAC CATHETERIZATION  12-27-2001  DR Jacinto Halim  &  05-26-2009  DR Eldridge Dace   RESULTS FOR BOTH ARE NORMAL CORONARIES AND PERSERVED LVF/ EF 60%   CARPAL TUNNEL RELEASE Bilateral right 09-16-2003;  left ?   CARPAL TUNNEL RELEASE Left 02/25/2015   Procedure: LEFT CARPAL TUNNEL RELEASE;  Surgeon: Betha Loa, MD;  Location: [facility];  Service: Orthopedics;  Laterality: Left;   CYSTOSCOPY N/A 10/10/2012   Procedure: CYSTOSCOPY CLOT EVACUATION FULGERATION OF BLEEDERS ;  Surgeon: Garnett Farm, MD;  Location: [facility];  Service: Urology;  Laterality: N/A;   CYSTOSCOPY WITH BIOPSY N/A 11/26/2015   Procedure: CYSTOSCOPY WITH BIOPSY AND FULGURATION;  Surgeon: Ihor Gully, MD;  Location: [facility];  Service: Urology;  Laterality: N/A;   ESOPHAGOGASTRODUODENOSCOPY  2014   IRRIGATION AND DEBRIDEMENT ABSCESS Right 09/27/2022   Procedure: IRRIGATION AND DEBRIDEMENT OF RIGHT BUTTOCK  ABSCESS;  Surgeon: Griselda Miner, MD;  Location: [facility] OR;  Service: General;  Laterality: Right;   LAPAROSCOPIC CHOLECYSTECTOMY  11-17-2010   ORCHIECTOMY Right 02/21/2016   Procedure: SCROTAL ORCHIECTOMY with TESTICULAR PROSTHESIS IMPLANT;  Surgeon: Ihor Gully, MD;  Location: [facility];  Service: Urology;  Laterality: Right;   ORCHIECTOMY Left 09/02/2018   Procedure: ORCHIECTOMY;  Surgeon: Ihor Gully, MD;  Location: [facility];  Service: Urology;  Laterality: Left;   ROTATOR CUFF REPAIR Right 12/2004   TRANSURETHRAL RESECTION OF BLADDER TUMOR N/A 08/09/2012   Procedure: TRANSURETHRAL RESECTION OF BLADDER TUMOR (TURBT) WITH GYRUS WITH MITOMYCIN C;  Surgeon: Garnett Farm, MD;  Location: [facility];  Service: Urology;  Laterality: N/A;   TRANSURETHRAL RESECTION OF BLADDER TUMOR N/A 03/29/2020   Procedure: TRANSURETHRAL RESECTION OF BLADDER TUMOR (TURBT) and post-op instillation of  gemcitabine;  Surgeon: [name], MD;  Location: [facility];  Service: Urology;  Laterality: N/A;   TRANSURETHRAL RESECTION OF BLADDER TUMOR WITH GYRUS (TURBT-GYRUS) N/A 02/27/2014   Procedure: TRANSURETHRAL RESECTION OF BLADDER TUMOR WITH GYRUS (TURBT-GYRUS);  Surgeon: Garnett Farm, MD;  Location: [facility];  Service: Urology;  Laterality: N/A;    reports that he has been smoking cigarettes. He has a 57 pack-year smoking history. He has never used smokeless tobacco. He reports that he does not drink alcohol and does not use drugs.  Mobility: Patient at baseline uses walker-he does not drive-he lives around his family--he is dependent on them for further care He tells me that he smokes half pack per day and has done so for a while  Allergies  Allergen Reactions   Celecoxib Anaphylaxis, Swelling and Rash    Tongue swells   Hydrocodone Rash and Other (See Comments)    "blisters developed on arms"    Sulfa Antibiotics Rash   Sulfacetamide Sodium Rash   Family History  Problem  Relation Age of Onset   Diabetes Mother    Diabetes Father    Hypertension Father    Heart attack Father 42       died age 78   Alcohol abuse Father    Colon cancer Neg Hx    Esophageal cancer Neg Hx    Stomach cancer Neg Hx    Rectal cancer Neg Hx    Prior to Admission medications   Medication Sig Start Date End Date Taking? Authorizing Provider  acetaminophen (TYLENOL) 325 MG tablet Take 2 tablets (650 mg total) by mouth every 6 (six) hours as needed for mild pain (or Fever >/= 101). 08/29/22   Marguerita Merles Latif, DO  amLODipine (NORVASC) 10 MG tablet Take 1 tablet (10 mg total) by mouth daily. 10/01/22   Leroy Sea, MD  baclofen (LIORESAL) 10 MG tablet Take 10 mg by mouth 3 (three) times daily. Sister only gives it to Patient at Bedtime.    [provider]  carvedilol (COREG) 6.25 MG tablet Take 1 tablet (6.25 mg total) by mouth 2 (two) times daily with  a meal. 09/30/22   Leroy Sea, MD  clopidogrel (PLAVIX) 75 MG tablet Take 75 mg by mouth daily. Wife called and spoke with nurse.  This is a medication the patient takes at home that was not entered at admission. Dr. Sibyl Parr informed.    [provider]  DULoxetine (CYMBALTA) 60 MG capsule Take 60 mg by mouth daily. Wife called and spoke with nurse.  This is a medication the patient takes at home that was not entered at admission. Dr. Sibyl Parr informed.    [provider]  ezetimibe (ZETIA) 10 MG tablet Take 1 tablet (10 mg total) by mouth daily. 10/01/22   Leroy Sea, MD  HUMALOG KWIKPEN 200 UNIT/ML KwikPen Inject 20 Units into the skin in the morning, at noon, and at bedtime. Patient taking differently: Inject 35 Units into the skin in the morning, at noon, and at bedtime. Sister stated Dr. Catalina Pizza her 35 units on both Insulin Pens 08/29/22   Marguerita Merles Latif, DO  hydroxychloroquine (PLAQUENIL) 200 MG tablet Take 200 mg by mouth 2 (two) times daily. Wife called and spoke with nurse.  This is a medication the patient takes at home that was not entered at admission. Dr. Sibyl Parr informed.    [provider]  hydrOXYzine (ATARAX) 25 MG tablet Take 25 mg by mouth at bedtime as needed for anxiety or itching. 08/01/22   [provider]  insulin glargine, 2 Unit Dial, (TOUJEO MAX SOLOSTAR) 300 UNIT/ML Solostar Pen Inject 40 Units into the skin 2 (two) times daily at 8 am and 10 pm. Patient taking differently: Inject 35 Units into the skin 2 (two) times daily at 8 am and 10 pm. Sister stated Dr. Has Patient using 35 units on both insulin pens. 08/29/22   Marguerita Merles Latif, DO  lamoTRIgine (LAMICTAL) 150 MG tablet Take 150 mg by mouth 2 (two) times daily. Wife called and spoke with nurse.  This is a medication the patient takes at home that was not entered at admission. Dr. Sibyl Parr informed.    [provider]  linezolid (ZYVOX) 600 MG tablet Take 1 tablet (600 mg  total) by mouth 2 (two) times daily. 09/30/22   Leroy Sea, MD  lurasidone (LATUDA) 20 MG TABS tablet Take 40 mg by mouth at bedtime.    [provider]  nicotine (NICODERM CQ - DOSED IN MG/24  HOURS) 21 mg/24hr patch Place 1 patch (21 mg total) onto the skin daily. 08/30/22   Marguerita Merles Latif, DO  nystatin (MYCOSTATIN/NYSTOP) powder Apply 1 Application topically 3 (three) times daily. 10/29/22   Melene Plan, DO  omeprazole (PRILOSEC) 40 MG capsule Take 40 mg by mouth daily. 08/23/21   [provider]  oxyCODONE (OXY IR/ROXICODONE) 5 MG immediate release tablet Take 1 tablet (5 mg total) by mouth every 12 (twelve) hours as needed for severe pain. 09/30/22   Leroy Sea, MD  pregabalin (LYRICA) 200 MG capsule Take 200 mg by mouth 3 (three) times daily. 12/25/21   Dois Davenport, MD  rOPINIRole (REQUIP) 0.5 MG tablet Take 0.5 mg by mouth at bedtime. 07/25/22   [provider]  rosuvastatin (CRESTOR) 20 MG tablet Take 1 tablet (20 mg total) by mouth at bedtime. 09/30/22   Leroy Sea, MD  tamsulosin (FLOMAX) 0.4 MG CAPS capsule Take 0.4 mg by mouth at bedtime.    [provider]  venlafaxine XR (EFFEXOR-XR) 150 MG 24 hr capsule Take 150 mg by mouth daily with breakfast. Wife called and spoke with nurse.  This is a medication the patient takes at home that was not entered at admission. Dr. Sibyl Parr informed.    [provider]    Physical Exam:  Vitals:   01/18/23 1304 01/18/23 1345  BP:  (!) 123/96  Pulse: 84 80  Resp:  20  Temp: (!) 97 F (36.1 C) 97.8 F (36.6 C)  SpO2: 98% 100%    Awake coherent a little bit dysarthric No icterus no pallor CTAB no added sound no rales no rhonchi S1-S2 seems to be in sinus Abdomen soft Suprapubic catheter picture as below  No lower extremity edema I am unable to note any specific abscess or other issues on the lower extremity   I have personally reviewed following labs and imaging  studies  Labs:  As above  Imaging studies:  As above  Medical tests:  EKG independently reviewed: Not done  Test discussed with performing physician: Discussed with Dr. Sherryll Burger having  Decision to obtain old records:  Yes  Review and summation of old records:  Yes  Active Problems:   * No active hospital problems. *   Assessment/Plan Suprapubic catheter with abscess surrounding failed outpatient management Patient tells me that he was seen by Dr. Jettie Pagan about a week ago and placed on antibiotics and that it did not look like this-urology has been consulted-continue vancomycin and ceftriaxone-will need probable definitive management therefore keep on clear liquid diet only until can be seen by urologist He is hemodynamically stable and can go to a telemetry bed Dr. Mena Goes has been consulted and will see in consult and review images  Bipolar 2 with suicidal attempt previously Resume hydroxyzine 25 itching, pregabalin 200 3 times daily, Lamictal twice daily, Cymbalta 60 daily, Effexor 150--- probably needs de-escalation off of polypharmacy in the outpatient setting Also was on Latuda 40 at bedtime which we will resume  Poorly controlled DM TY 2 neuropathy gastroparesis Resume Lantus but at lower dose of 15 units twice daily Placed on sliding scale coverage  Rheumatoid arthritis Is on Plaquenil?  200 twice daily-this could account for the patient's poor healing in the setting of catheter placement Will hold for now  HTN Resume Coreg 6.25 twice daily, amlodipine   Prior TIA Hold Plavix for now as procedure may be planned-resume if none Resume statins on discharge  Smoker Resume nicotine  patch-Will need outpatient counseling    Severity of Illness: The appropriate patient status for this patient is OBSERVATION. Observation status is judged to be reasonable and necessary in order to provide the required intensity of service to ensure the patient's safety. The  patient's presenting symptoms, physical exam findings, and initial radiographic and laboratory data in the context of their medical condition is felt to place them at decreased risk for further clinical deterioration. Furthermore, it is anticipated that the patient will be medically stable for discharge from the hospital within 2 midnights of admission.    DVT prophylaxis: Heparin Code Status: Full Family Communication: None none Consults called: Urologist  Time spent: 35 minutes  Mahala Menghini, MD [days-call my NP partners at night for Care related issues] [facility] --[facility] [employer] [place] , [email or web address]; password [facility]  01/18/2023, 4:31 PM

## 2023-01-18 NOTE — Hospital Course (Signed)
HPI  Vincent [name] ZOX:[id number] DOB: 1965-04-10 DOA: 01/18/2023  PCP: Dois Davenport, MD   Chief Complaint: ***  HPI:  13 wm known poorly controlled diabetes complicated gastroparesis Anxiety disorder Bipolar 2 with prior suicidal attempt Chronic pain syndrome Reflux Prior stroke status post tPA 05/2020--he has been admitted several times for focal neurological deficits with hyperglycemia additionally-at last visit he was placed on Plavix alone COPD Previous seizures on Lamictal Previous bladder cancer Benign essential tremor  Recent right gluteal cleft abscess status post incision and drainage-underwent general surgery evaluation 09/27/2022 and was discharged on 09/30/2022  Brought in from home pain discharge around suprapubic cath that was placed on 10/15 [had been on cefdinir ]by IR endorsing 6/10 pain with CBGs >500 WBC 8.2 hemoglobin 13 platelet 408 BUN/creatinine 12/1.0, sodium 138  General Surgery, urology consulted  Review of Systems:  ***  Pertinent +'s: Pertinent -"s:  ED Course: ***    Past Medical History:  Diagnosis Date   Anxiety    Benign essential tremor    Bipolar 2 disorder (HCC)    followed by [facility]--- dr s. Lolly Mustache   Bladder cancer [facility])    recurrent   CAD (coronary artery disease)    cardiac cath 2003  and 2011 both showed normal coronary arteries w/ preserved lvf;  Non obstructive on CTA Oct 2019.    Chronic pain syndrome    back---- [facility] [place]   Cold extremities    BLE   COPD (chronic obstructive pulmonary disease) (HCC)    DDD (degenerative disc disease), lumbar    Diabetic peripheral neuropathy (HCC)    Gastroparesis    followed by dr Marina Goodell   GERD (gastroesophageal reflux disease)    Hiatal hernia    History of bladder cancer urologist-  previously dr Ronal Fear;  now dr gay   papillay TCC (Ta G1)  s/p TURBT and chemo instillation 2014   History of chest pain 12/2017   heart cath normal   History of  encephalopathy 05/27/2015   admission w/ acute encephalopathy thought to be secondary to pain meds and COPD   History of gastric ulcer    History of Helicobacter pylori infection    History of kidney stones    History of TIA (transient ischemic attack) 2008  and 10-19-2018    no residual's   History of traumatic head injury 2010   w/ LOC  per pt needed stitches, hit in head with a mower blade   Hyperlipidemia    Hypertension    Hypogonadism male    s/p  bilateral orchiectomy   Hypothyroidism    Insomnia    Mild obstructive sleep apnea    study in epic 12-04-2016, no cpap   PTSD (post-traumatic stress disorder)    chronic   PTSD (post-traumatic stress disorder)    RA (rheumatoid arthritis) (HCC)    followed by [facility]   Seizures, transient [facility]) neurologist-  dr Terrace Arabia--  differential dx complex partial seizure .vs.  mood disorder .vs.  pseudoseizure--  negative EEG's   confusion episodes and staring spells since 11/ 2015   (03-26-2020 per pt wife last seizure 10 /2021)   Suicide attempt by drug overdose (HCC) 07/28/2021   Transient confusion NEUOROLOGIST-  DR Terrace Arabia   Episodes since 11/ 2015--  neurologist dx  differential complex partial seizure  .vs. mood disorder . vs. pseudoseizure   Type 2 diabetes mellitus treated with insulin [facility])    endocrinologist--- dr Everardo All---  (  03-26-2020 pt does not check blood sugar at home)   Past Surgical History:  Procedure Laterality Date   AMPUTATION Left 04/28/2020   Procedure: LEFT LITTLE FINGER AMPUTATION;  Surgeon: Nadara Mustard, MD;  Location: [facility] OR;  Service: Orthopedics;  Laterality: Left;   CARDIAC CATHETERIZATION  12-27-2001  DR Jacinto Halim  &  05-26-2009  DR Eldridge Dace   RESULTS FOR BOTH ARE NORMAL CORONARIES AND PERSERVED LVF/ EF 60%   CARPAL TUNNEL RELEASE Bilateral right 09-16-2003;  left ?   CARPAL TUNNEL RELEASE Left 02/25/2015   Procedure: LEFT CARPAL TUNNEL RELEASE;  Surgeon: Betha Loa, MD;  Location: [facility];  Service: Orthopedics;  Laterality: Left;   CYSTOSCOPY N/A 10/10/2012   Procedure: CYSTOSCOPY CLOT EVACUATION FULGERATION OF BLEEDERS ;  Surgeon: Garnett Farm, MD;  Location: [facility];  Service: Urology;  Laterality: N/A;   CYSTOSCOPY WITH BIOPSY N/A 11/26/2015   Procedure: CYSTOSCOPY WITH BIOPSY AND FULGURATION;  Surgeon: Ihor Gully, MD;  Location: [facility];  Service: Urology;  Laterality: N/A;   ESOPHAGOGASTRODUODENOSCOPY  2014   IRRIGATION AND DEBRIDEMENT ABSCESS Right 09/27/2022   Procedure: IRRIGATION AND DEBRIDEMENT OF RIGHT BUTTOCK  ABSCESS;  Surgeon: Griselda Miner, MD;  Location: [facility] OR;  Service: General;  Laterality: Right;   LAPAROSCOPIC CHOLECYSTECTOMY  11-17-2010   ORCHIECTOMY Right 02/21/2016   Procedure: SCROTAL ORCHIECTOMY with TESTICULAR PROSTHESIS IMPLANT;  Surgeon: Ihor Gully, MD;  Location: [facility];  Service: Urology;  Laterality: Right;   ORCHIECTOMY Left 09/02/2018   Procedure: ORCHIECTOMY;  Surgeon: Ihor Gully, MD;  Location: [facility];  Service: Urology;  Laterality: Left;   ROTATOR CUFF REPAIR Right 12/2004   TRANSURETHRAL RESECTION OF BLADDER TUMOR N/A 08/09/2012   Procedure: TRANSURETHRAL RESECTION OF BLADDER TUMOR (TURBT) WITH GYRUS WITH MITOMYCIN C;  Surgeon: Garnett Farm, MD;  Location: [facility];  Service: Urology;  Laterality: N/A;   TRANSURETHRAL RESECTION OF BLADDER TUMOR N/A 03/29/2020   Procedure: TRANSURETHRAL RESECTION OF BLADDER TUMOR (TURBT) and post-op instillation of gemcitabine;  Surgeon: [name], MD;  Location: [facility];  Service: Urology;  Laterality: N/A;   TRANSURETHRAL RESECTION OF BLADDER TUMOR WITH GYRUS (TURBT-GYRUS) N/A 02/27/2014   Procedure: TRANSURETHRAL RESECTION OF BLADDER TUMOR WITH GYRUS (TURBT-GYRUS);  Surgeon: Garnett Farm, MD;  Location: [facility];  Service: Urology;  Laterality: N/A;     reports that he has been smoking cigarettes. He has a 57 pack-year smoking history. He has never used smokeless tobacco. He reports that he does not drink alcohol and does not use drugs.  Mobility: ***  Allergies  Allergen Reactions   Celecoxib Anaphylaxis, Swelling and Rash    Tongue swells   Hydrocodone Rash and Other (See Comments)    "blisters developed on arms"    Sulfa Antibiotics Rash   Sulfacetamide Sodium Rash   Family History  Problem Relation Age of Onset   Diabetes Mother    Diabetes Father    Hypertension Father    Heart attack Father 64       died age 45   Alcohol abuse Father    Colon cancer Neg Hx    Esophageal cancer Neg Hx    Stomach cancer Neg Hx    Rectal cancer Neg Hx    Prior to Admission medications   Medication Sig Start Date End Date Taking? Authorizing Provider  acetaminophen (TYLENOL) 325 MG tablet Take 2 tablets (650  mg total) by mouth every 6 (six) hours as needed for mild pain (or Fever >/= 101). 08/29/22   Marguerita Merles Latif, DO  amLODipine (NORVASC) 10 MG tablet Take 1 tablet (10 mg total) by mouth daily. 10/01/22   Leroy Sea, MD  baclofen (LIORESAL) 10 MG tablet Take 10 mg by mouth 3 (three) times daily. Sister only gives it to Patient at Bedtime.    [provider]  carvedilol (COREG) 6.25 MG tablet Take 1 tablet (6.25 mg total) by mouth 2 (two) times daily with a meal. 09/30/22   Leroy Sea, MD  clopidogrel (PLAVIX) 75 MG tablet Take 75 mg by mouth daily. Wife called and spoke with nurse.  This is a medication the patient takes at home that was not entered at admission. Dr. Sibyl Parr informed.    [provider]  DULoxetine (CYMBALTA) 60 MG capsule Take 60 mg by mouth daily. Wife called and spoke with nurse.  This is a medication the patient takes at home that was not entered at admission. Dr. Sibyl Parr informed.    [provider]  ezetimibe (ZETIA) 10 MG tablet Take 1 tablet (10 mg total) by mouth daily. 10/01/22    Leroy Sea, MD  HUMALOG KWIKPEN 200 UNIT/ML KwikPen Inject 20 Units into the skin in the morning, at noon, and at bedtime. Patient taking differently: Inject 35 Units into the skin in the morning, at noon, and at bedtime. Sister stated Dr. Catalina Pizza her 35 units on both Insulin Pens 08/29/22   Marguerita Merles Latif, DO  hydroxychloroquine (PLAQUENIL) 200 MG tablet Take 200 mg by mouth 2 (two) times daily. Wife called and spoke with nurse.  This is a medication the patient takes at home that was not entered at admission. Dr. Sibyl Parr informed.    [provider]  hydrOXYzine (ATARAX) 25 MG tablet Take 25 mg by mouth at bedtime as needed for anxiety or itching. 08/01/22   [provider]  insulin glargine, 2 Unit Dial, (TOUJEO MAX SOLOSTAR) 300 UNIT/ML Solostar Pen Inject 40 Units into the skin 2 (two) times daily at 8 am and 10 pm. Patient taking differently: Inject 35 Units into the skin 2 (two) times daily at 8 am and 10 pm. Sister stated Dr. Has Patient using 35 units on both insulin pens. 08/29/22   Marguerita Merles Latif, DO  lamoTRIgine (LAMICTAL) 150 MG tablet Take 150 mg by mouth 2 (two) times daily. Wife called and spoke with nurse.  This is a medication the patient takes at home that was not entered at admission. Dr. Sibyl Parr informed.    [provider]  linezolid (ZYVOX) 600 MG tablet Take 1 tablet (600 mg total) by mouth 2 (two) times daily. 09/30/22   Leroy Sea, MD  lurasidone (LATUDA) 20 MG TABS tablet Take 40 mg by mouth at bedtime.    [provider]  nicotine (NICODERM CQ - DOSED IN MG/24 HOURS) 21 mg/24hr patch Place 1 patch (21 mg total) onto the skin daily. 08/30/22   Marguerita Merles Latif, DO  nystatin (MYCOSTATIN/NYSTOP) powder Apply 1 Application topically 3 (three) times daily. 10/29/22   Melene Plan, DO  omeprazole (PRILOSEC) 40 MG capsule Take 40 mg by mouth daily. 08/23/21   [provider]  oxyCODONE (OXY IR/ROXICODONE) 5 MG immediate release  tablet Take 1 tablet (5 mg total) by mouth every 12 (twelve) hours as needed for severe pain. 09/30/22   Leroy Sea, MD  pregabalin (LYRICA) 200 MG  capsule Take 200 mg by mouth 3 (three) times daily. 12/25/21   Dois Davenport, MD  rOPINIRole (REQUIP) 0.5 MG tablet Take 0.5 mg by mouth at bedtime. 07/25/22   [provider]  rosuvastatin (CRESTOR) 20 MG tablet Take 1 tablet (20 mg total) by mouth at bedtime. 09/30/22   Leroy Sea, MD  tamsulosin (FLOMAX) 0.4 MG CAPS capsule Take 0.4 mg by mouth at bedtime.    [provider]  venlafaxine XR (EFFEXOR-XR) 150 MG 24 hr capsule Take 150 mg by mouth daily with breakfast. Wife called and spoke with nurse.  This is a medication the patient takes at home that was not entered at admission. Dr. Sibyl Parr informed.    [provider]    Physical Exam:  Vitals:   01/18/23 1304 01/18/23 1345  BP:  (!) 123/96  Pulse: 84 80  Resp:  20  Temp: (!) 97 F (36.1 C) 97.8 F (36.6 C)  SpO2: 98% 100%    ***  I have personally reviewed following labs and imaging studies  Labs:  ***    Imaging studies:  ***   Medical tests:  EKG independently reviewed: ***    Test discussed with performing physician: ***   Decision to obtain old records:  ***   Review and summation of old records:  ***   Active Problems:   * No active hospital problems. *   Assessment/Plan ***   Severity of Illness: {Observation/Inpatient:21159}   DVT prophylaxis:*** Code Status: *** Family Communication: *** Consults called: ***    Time spent: *** minutes  Mahala Menghini, MD Cordelia Poche my NP partners at night for Care related issues] [facility] --[employer] [employer] OR , [email or web address]; password [email or web address]  01/18/2023, 4:31 PM

## 2023-01-18 NOTE — Consult Note (Signed)
Urology Consult Note   Requesting Attending Physician:  Rhetta Mura, MD Service Providing Consult: Urology  Consulting Attending: Dr. Mahala Menghini  Reason for Consult:  SPT fluid collection  HPI: [name] is seen in consultation for reasons noted above at the request of Rhetta Mura, MD for the evaluation of fluid collection around the suprapubic tract.  This is a 57 y.o. male with past medical history significant for diabetes, current smoker, nonmuscle invasive bladder cancer and areflexic bladder managed with a suprapubic tube that is presenting with worsening suprapubic pain over the last 1 to 2 weeks.  Patient had suprapubic placed on 12/26/2022.  Over the last couple of weeks, patient has noted increasing suprapubic discomfort around the site.  He is also noticed mild drainage from around the area.  He states that he has had no fevers, chills, or other infectious symptoms.  He states that the catheter has been draining without issue.  He was seen by Dr[facility] Cardell Peach in [facility], and was prescribed antibiotic for what was deemed to be a superficial wound infection.  He presents to the ED today given worsening of his symptoms.  CT scan in the ED shows a significant fluid collection around the suprapubic track, that appears to extend into the rectus sheath.  His labs show no leukocytosis.  His creatinine is stable, and his hemoglobin does not show any acute drop.  He endorses no new urinary symptoms.  Review of systems otherwise negative other than that noted in the HPI.   Past Medical History: Past Medical History:  Diagnosis Date   Anxiety    Benign essential tremor    Bipolar 2 disorder (HCC)    followed by [facility]--- dr s. Lolly Mustache   Bladder cancer [facility])    recurrent   CAD (coronary artery disease)    cardiac cath 2003  and 2011 both showed normal coronary arteries w/ preserved lvf;  Non obstructive on CTA Oct 2019.    Chronic pain syndrome    back----  [facility] [place]   Cold extremities    BLE   COPD (chronic obstructive pulmonary disease) (HCC)    DDD (degenerative disc disease), lumbar    Diabetic peripheral neuropathy (HCC)    Gastroparesis    followed by dr Marina Goodell   GERD (gastroesophageal reflux disease)    Hiatal hernia    History of bladder cancer urologist-  previously dr Ronal Fear;  now dr gay   papillay TCC (Ta G1)  s/p TURBT and chemo instillation 2014   History of chest pain 12/2017   heart cath normal   History of encephalopathy 05/27/2015   admission w/ acute encephalopathy thought to be secondary to pain meds and COPD   History of gastric ulcer    History of Helicobacter pylori infection    History of kidney stones    History of TIA (transient ischemic attack) 2008  and 10-19-2018    no residual's   History of traumatic head injury 2010   w/ LOC  per pt needed stitches, hit in head with a mower blade   Hyperlipidemia    Hypertension    Hypogonadism male    s/p  bilateral orchiectomy   Hypothyroidism    Insomnia    Mild obstructive sleep apnea    study in epic 12-04-2016, no cpap   PTSD (post-traumatic stress disorder)    chronic   PTSD (post-traumatic stress disorder)    RA (rheumatoid arthritis) (HCC)  followed by [facility]   Seizures, transient [facility]) neurologist-  dr Terrace Arabia--  differential dx complex partial seizure .vs.  mood disorder .vs.  pseudoseizure--  negative EEG's   confusion episodes and staring spells since 11/ 2015   (03-26-2020 per pt wife last seizure 10 /2021)   Suicide attempt by drug overdose (HCC) 07/28/2021   Transient confusion NEUOROLOGIST-  DR Terrace Arabia   Episodes since 11/ 2015--  neurologist dx  differential complex partial seizure  .vs. mood disorder . vs. pseudoseizure   Type 2 diabetes mellitus treated with insulin [facility])    endocrinologist--- dr Everardo All---  (03-26-2020 pt does not check blood sugar at home)    Past Surgical History:  Past Surgical  History:  Procedure Laterality Date   AMPUTATION Left 04/28/2020   Procedure: LEFT LITTLE FINGER AMPUTATION;  Surgeon: Nadara Mustard, MD;  Location[facility];  Service: Orthopedics;  Laterality: Left;   CARDIAC CATHETERIZATION  12-27-2001  DR Jacinto Halim  &  05-26-2009  DR Eldridge Dace   RESULTS FOR BOTH ARE NORMAL CORONARIES AND PERSERVED LVF/ EF 60%   CARPAL TUNNEL RELEASE Bilateral right 09-16-2003;  left ?   CARPAL TUNNEL RELEASE Left 02/25/2015   Procedure: LEFT CARPAL TUNNEL RELEASE;  Surgeon: Betha Loa, MD;  Location: [facility];  Service: Orthopedics;  Laterality: Left;   CYSTOSCOPY N/A 10/10/2012   Procedure: CYSTOSCOPY CLOT EVACUATION FULGERATION OF BLEEDERS ;  Surgeon: Garnett Farm, MD;  Location: [facility];  Service: Urology;  Laterality: N/A;   CYSTOSCOPY WITH BIOPSY N/A 11/26/2015   Procedure: CYSTOSCOPY WITH BIOPSY AND FULGURATION;  Surgeon: Ihor Gully, MD;  Location: [facility];  Service: Urology;  Laterality: N/A;   ESOPHAGOGASTRODUODENOSCOPY  2014   IRRIGATION AND DEBRIDEMENT ABSCESS Right 09/27/2022   Procedure: IRRIGATION AND DEBRIDEMENT OF RIGHT BUTTOCK  ABSCESS;  Surgeon: Griselda Miner, MD;  Location[facility] OR;  Service: General;  Laterality: Right;   LAPAROSCOPIC CHOLECYSTECTOMY  11-17-2010   ORCHIECTOMY Right 02/21/2016   Procedure: SCROTAL ORCHIECTOMY with TESTICULAR PROSTHESIS IMPLANT;  Surgeon: Ihor Gully, MD;  Location: [facility];  Service: Urology;  Laterality: Right;   ORCHIECTOMY Left 09/02/2018   Procedure: ORCHIECTOMY;  Surgeon: Ihor Gully, MD;  Location: [facility];  Service: Urology;  Laterality: Left;   ROTATOR CUFF REPAIR Right 12/2004   TRANSURETHRAL RESECTION OF BLADDER TUMOR N/A 08/09/2012   Procedure: TRANSURETHRAL RESECTION OF BLADDER TUMOR (TURBT) WITH GYRUS WITH MITOMYCIN C;  Surgeon: Garnett Farm, MD;  Location: [facility];  Service: Urology;  Laterality: N/A;    TRANSURETHRAL RESECTION OF BLADDER TUMOR N/A 03/29/2020   Procedure: TRANSURETHRAL RESECTION OF BLADDER TUMOR (TURBT) and post-op instillation of gemcitabine;  Surgeon: [name], MD;  Location: [facility];  Service: Urology;  Laterality: N/A;   TRANSURETHRAL RESECTION OF BLADDER TUMOR WITH GYRUS (TURBT-GYRUS) N/A 02/27/2014   Procedure: TRANSURETHRAL RESECTION OF BLADDER TUMOR WITH GYRUS (TURBT-GYRUS);  Surgeon: Garnett Farm, MD;  Location: [facility];  Service: Urology;  Laterality: N/A;    Medication: Current Facility-Administered Medications  Medication Dose Route Frequency Provider Last Rate Last Admin   0.9 %  sodium chloride infusion   Intravenous Continuous Rhetta Mura, MD 40 mL/hr at 01/18/23 1726 New Bag at 01/18/23 1726   amLODipine (NORVASC) tablet 10 mg  10 mg Oral Daily Rhetta Mura, MD       carvedilol (COREG) tablet 6.25 mg  6.25 mg Oral BID  WC Rhetta Mura, MD       DULoxetine (CYMBALTA) DR capsule 60 mg  60 mg Oral Daily Rhetta Mura, MD       heparin injection 5,000 Units  5,000 Units Subcutaneous Q8H Rhetta Mura, MD       hydrOXYzine (ATARAX) tablet 25 mg  25 mg Oral QHS PRN Rhetta Mura, MD       insulin glargine (2 Unit Dial) (TOUJEO MAX) Solostar Pen SOPN 15 Units  15 Units Subcutaneous BID AC & HS Samtani, Jai-Gurmukh, MD       lamoTRIgine (LAMICTAL) tablet 150 mg  150 mg Oral BID Rhetta Mura, MD       lurasidone (LATUDA) tablet 40 mg  40 mg Oral QHS Samtani, Jai-Gurmukh, MD       nicotine (NICODERM CQ - dosed in mg/24 hours) patch 21 mg  21 mg Transdermal Daily Samtani, Jai-Gurmukh, MD       oxyCODONE (Oxy IR/ROXICODONE) immediate release tablet 5 mg  5 mg Oral Q12H PRN Rhetta Mura, MD       pantoprazole (PROTONIX) EC tablet 40 mg  40 mg Oral Daily Samtani, Jai-Gurmukh, MD       pregabalin (LYRICA) capsule 200 mg  200 mg Oral TID Rhetta Mura, MD        sodium chloride flush (NS) 0.9 % injection 3 mL  3 mL Intravenous Q12H Rhetta Mura, MD       vancomycin (VANCOREADY) IVPB 1500 mg/300 mL  1,500 mg Intravenous STAT Norva Pavlov, RPH 150 mL/hr at 01/18/23 1725 1,500 mg at 01/18/23 1725   [START ON 01/19/2023] venlafaxine XR (EFFEXOR-XR) 24 hr capsule 150 mg  150 mg Oral Q breakfast Rhetta Mura, MD       Current Outpatient Medications  Medication Sig Dispense Refill   amLODipine (NORVASC) 10 MG tablet Take 1 tablet (10 mg total) by mouth daily. 30 tablet 0   cefdinir (OMNICEF) 300 MG capsule Take 300 mg by mouth 2 (two) times daily.     Cholecalciferol (VITAMIN D3) 1000 units CAPS Take 1,000 Units by mouth in the morning.     clopidogrel (PLAVIX) 75 MG tablet Take 75 mg by mouth at bedtime.     HUMALOG KWIKPEN 200 UNIT/ML KwikPen Inject 20 Units into the skin in the morning, at noon, and at bedtime. (Patient taking differently: Inject 35 Units into the skin 3 (three) times daily after meals.)     hydrOXYzine (ATARAX) 25 MG tablet Take 25 mg by mouth at bedtime.     insulin glargine, 2 Unit Dial, (TOUJEO MAX SOLOSTAR) 300 UNIT/ML Solostar Pen Inject 40 Units into the skin 2 (two) times daily at 8 am and 10 pm. (Patient taking differently: Inject 35-50 Units into the skin See admin instructions. Inject 35-50 units into the skin after every meal eaten)     omeprazole (PRILOSEC) 40 MG capsule Take 40 mg by mouth daily before breakfast.     pregabalin (LYRICA) 200 MG capsule Take 200 mg by mouth 3 (three) times daily.     rOPINIRole (REQUIP) 0.5 MG tablet Take 0.5 mg by mouth at bedtime.     rosuvastatin (CRESTOR) 40 MG tablet Take 40 mg by mouth at bedtime.     venlafaxine XR (EFFEXOR-XR) 150 MG 24 hr capsule Take 150 mg by mouth daily with breakfast. Wife called and spoke with nurse.  This is a medication the patient takes at home that was not entered at admission. Dr. Sibyl Parr informed.  acetaminophen (TYLENOL) 325 MG tablet  Take 2 tablets (650 mg total) by mouth every 6 (six) hours as needed for mild pain (or Fever >/= 101). (Patient not taking: Reported on 01/18/2023) 20 tablet 0   carvedilol (COREG) 6.25 MG tablet Take 1 tablet (6.25 mg total) by mouth 2 (two) times daily with a meal. (Patient not taking: Reported on 01/18/2023) 60 tablet 0   ezetimibe (ZETIA) 10 MG tablet Take 1 tablet (10 mg total) by mouth daily. (Patient not taking: Reported on 01/18/2023) 30 tablet 0   linezolid (ZYVOX) 600 MG tablet Take 1 tablet (600 mg total) by mouth 2 (two) times daily. (Patient not taking: Reported on 01/18/2023) 6 tablet 0   nicotine (NICODERM CQ - DOSED IN MG/24 HOURS) 21 mg/24hr patch Place 1 patch (21 mg total) onto the skin daily. (Patient not taking: Reported on 01/18/2023) 28 patch 0   nystatin (MYCOSTATIN/NYSTOP) powder Apply 1 Application topically 3 (three) times daily. (Patient not taking: Reported on 01/18/2023) 15 g 0   oxyCODONE (OXY IR/ROXICODONE) 5 MG immediate release tablet Take 1 tablet (5 mg total) by mouth every 12 (twelve) hours as needed for severe pain. (Patient not taking: Reported on 01/18/2023) 10 tablet 0   rosuvastatin (CRESTOR) 20 MG tablet Take 1 tablet (20 mg total) by mouth at bedtime. (Patient not taking: Reported on 01/18/2023) 30 tablet 0    Allergies: Allergies  Allergen Reactions   Celecoxib Anaphylaxis, Swelling, Rash and Other (See Comments)    Tongue swells   Hydrocodone Rash and Other (See Comments)    "Blisters developed on arms"; tolerated Norco okay (per sister)    Sulfa Antibiotics Rash    Social History: Social History   Tobacco Use   Smoking status: Every Day    Current packs/day: 1.50    Average packs/day: 1.5 packs/day for 38.0 years (57.0 ttl pk-yrs)    Types: Cigarettes   Smokeless tobacco: Never  Vaping Use   Vaping status: Never Used  Substance Use Topics   Alcohol use: No   Drug use: No    Family History Family History  Problem Relation Age of Onset    Diabetes Mother    Diabetes Father    Hypertension Father    Heart attack Father 78       died age 62   Alcohol abuse Father    Colon cancer Neg Hx    Esophageal cancer Neg Hx    Stomach cancer Neg Hx    Rectal cancer Neg Hx     Review of Systems 10 systems were reviewed and are negative except as noted specifically in the HPI.  Objective   Vital signs in last 24 hours: BP (!) 150/76   Pulse 83   Temp 97.8 F (36.6 C) (Oral)   Resp 18   Ht 6' (1.829 m)   Wt 83.9 kg   SpO2 98%   BMI 25.09 kg/m   Physical Exam General: NAD, A&O, resting, appropriate HEENT: [place]/AT, EOMI, MMM Pulmonary: Normal work of breathing Cardiovascular: HDS, adequate peripheral perfusion Abdomen: Soft, nondistended.  Suprapubic tenderness noted with moderate palpation.  Proteinaceous drainage noted around the suprapubic track, unable to express fluid with palpation. Extremities: warm and well perfused Neuro: Appropriate, no focal neurological deficits  Most Recent Labs: Lab Results  Component Value Date   WBC 8.2 01/18/2023   HGB 13.2 01/18/2023   HCT 38.5 (L) 01/18/2023   PLT 408 (H) 01/18/2023    Lab Results  Component Value Date  NA 138 01/18/2023   K 3.5 01/18/2023   CL 103 01/18/2023   CO2 26 01/18/2023   BUN 12 01/18/2023   CREATININE 1.07 01/18/2023   CALCIUM 9.6 01/18/2023   MG 1.9 08/29/2022   PHOS 5.8 (H) 08/29/2022    Lab Results  Component Value Date   INR 0.9 12/26/2022   APTT 30 09/24/2022     Urine Culture: @[id number] (laburin,org,[id number])@   IMAGING: CT ABDOMEN PELVIS W CONTRAST  Result Date: 01/18/2023 CLINICAL DATA:  Status post suprapubic bladder catheter. Abdominal pain EXAM: CT ABDOMEN AND PELVIS WITH CONTRAST TECHNIQUE: Multidetector CT imaging of the abdomen and pelvis was performed using the standard protocol following bolus administration of intravenous contrast. RADIATION DOSE REDUCTION: This exam was performed according to the departmental  dose-optimization program which includes automated exposure control, adjustment of the mA and/or kV according to patient size and/or use of iterative reconstruction technique. CONTRAST:  OMNIPAQUE IOHEXOL 300 MG/ML  SOLN COMPARISON:  Procedure CT 12/26/2022.  Standard CT 06/21/2022. FINDINGS: Lower chest: There is some linear opacity lung bases likely scar or atelectasis. No pleural effusion. Slight breathing motion at the lung bases. Coronary artery calcifications are seen. Please correlate for other coronary risk factors. Hepatobiliary: No focal liver abnormality is seen. Status post cholecystectomy. No biliary dilatation. Patent portal vein. Pancreas: Global atrophy of the pancreas.  No obvious mass. Spleen: Normal in size without focal abnormality. Adrenals/Urinary Tract: Adrenal glands are preserved. Small lower pole right-sided benign-appearing Bosniak 2 cyst. No specific follow-up. The ureters have normal course and caliber down to the bladder. Suprapubic catheter in place. The bladder is underdistended. There is significant bladder wall thickening there is some air in the lumen. Along the course of the catheter is a small fluid collection with the air. The collection likely has wall enhancement. This measures 6.7 by 3.7 cm. Please correlate for any evidence of a urine leak. Please correlate for abscess formation. This does track superiorly along the course of the rectus abdominis muscle itself as seen on sagittal series 9, image 79. Stomach/Bowel: Stomach is within normal limits. Appendix appears normal. No evidence of bowel wall thickening, distention, or inflammatory changes. Vascular/Lymphatic: Scattered vascular calcifications. Normal caliber aorta and IVC. There are some prominent retroperitoneal nodes seen at the level of the celiac axis. These are are more prominent than usually seen but not pathologic by size criteria. These could be reactive. Reproductive: Prostate is unremarkable. Other: No  free air or free fluid. Musculoskeletal: Scattered degenerative changes along the spine and pelvis. There is some sclerosis of the S1 vertebral level, unchanged from prior CT scan. IMPRESSION: Suprapubic catheter in place. Along the course of the catheter along the pelvic wall is a presumed rim enhancing fluid and gas collection measuring up to 6.7 x 3.7 cm. This tracks along the muscle belly of the rectus abdominis muscle in this location superiorly. Please correlate for any evidence of leak along the catheter versus infection or abscess or both. No additional fluid collections identified. No bowel obstruction free air. Fatty liver infiltration. Electronically Signed   By: Karen Kays M.D.   On: 01/18/2023 16:30    ------  Assessment:  57 y.o. male with diabetes, current smoker, intermediate risk bladder cancer, bilateral orchiectomy for chronic testicular pain, and atonic bladder managed with a suprapubic catheter placed 12/26/2022 that presents with ongoing suprapubic abdominal pain and CT scan demonstrating fluid collection adjacent to the suprapubic tract.  Reassuringly, the patient is hemodynamically stable.  He has been afebrile,  and show no signs of systemic infection.  He has been on antibiotics since 01/04/2023, but believe that these are likely not sufficient to treat this fluid collection.  On the scan, there is air within the fluid collection, but believe that this is due to extension from the suprapubic tract, and not due to a necrotizing organism.  Patient states that the fluid around his suprapubic has been draining for 2 weeks.  However, I am able to only minimally express any fluid on exam.  Patient would benefit from fluid drainage with interventional radiology.  Would not favor cutdown or I&D at bedside given proximity to suprapubic tract.  Will continue IV antibiotics until interventional radiology is able to weigh in for drainage.   Recommendations: -Continue broad-spectrum  antibiotics -Swab taken of suprapubic tract, will follow-up culture -IR consulted for possible drain placement tomorrow  Thank you for this consult. Please contact the urology consult pager with any further questions/concerns.  Roby Lofts, MD Resident Physician [facility]

## 2023-01-18 NOTE — ED Notes (Signed)
Emptied urine bag, approximately 100 ml

## 2023-01-18 NOTE — ED Notes (Signed)
ED TO INPATIENT HANDOFF REPORT  Name/Age/Gender Vincent [name] 57 y.o. male  Code Status    Code Status Orders  (From admission, onward)           Start     Ordered   01/18/23 1649  Full code  Continuous       Question:  By:  Answer:  Consent: discussion documented in EHR   01/18/23 1649           Code Status History     Date Active Date Inactive Code Status Order ID Comments User Context   12/26/2022 1227 12/27/2022 0505 Full Code [id number]  Gilmer Mor, DO HOV   09/24/2022 2035 09/30/2022 1723 Full Code [id number]  Cathleen Corti, MD ED   09/24/2022 2031 09/24/2022 2035 Full Code [id number]  Cathleen Corti, MD ED   08/26/2022 0500 08/29/2022 2125 Full Code [id number]  Gery Pray, MD ED   07/18/2022 2003 07/20/2022 1623 Full Code [id number]  Rometta Emery, MD ED   05/23/2022 2123 05/27/2022 1739 Full Code [id number]  Howerter, Justin B, DO ED   09/03/2021 2337 09/04/2021 1615 Full Code [id number]  Hillary Bow, DO ED   07/28/2021 1632 07/31/2021 0100 Full Code [id number]  Clydie Braun, MD ED   07/28/2021 0940 07/28/2021 1632 Full Code [id number]  Al Decant, PA-C ED   11/14/2020 1807 11/16/2020 1807 Full Code [id number]  Clydie Braun, MD Inpatient   06/10/2020 1156 06/12/2020 1653 Full Code [id number]  Erin Fulling, MD ED   01/07/2020 1937 01/08/2020 1835 Full Code [id number]  Hillary Bow, DO ED   10/19/2018 2051 10/20/2018 2024 Full Code [id number]  Levie Heritage, DO Inpatient   01/01/2018 1944 01/03/2018 1407 Full Code [id number]  Briscoe Deutscher, MD ED   05/05/2016 1939 05/05/2016 2044 Full Code [id number]  Shari Prows, MD Inpatient   05/05/2016 1742 05/05/2016 1939 Full Code [id number]  Ward, Chase Picket, PA-C ED   05/28/2015 0411 05/29/2015 1528 Full Code [id number]  Eduard Clos, MD ED   03/05/2014 0352 03/06/2014 1931 Full Code [id number]  Eduard Clos, MD Inpatient       Home/SNF/Other Home  Chief Complaint Hx of  abdominal abscess [Z87.898]  Level of Care/Admitting Diagnosis ED Disposition     ED Disposition  Admit   Condition  --   Comment  Hospital Area: [facility] [[id number]]  Level of Care: Med-Surg [16]  May admit patient to [facility] or [facility] if equivalent level of care is available:: No  Covid Evaluation: Asymptomatic - no recent exposure (last 10 days) testing not required  Diagnosis: Hx of abdominal abscess [614431]  Admitting Physician: Rhetta Mura [[id number]]  Attending Physician: Rhetta Mura [phone]  Certification:: I certify this patient will need inpatient services for at least 2 midnights  Expected Medical Readiness: 01/21/2023          Medical History Past Medical History:  Diagnosis Date   Anxiety    Benign essential tremor    Bipolar 2 disorder [facility])    followed by [facility]--- dr s. Lolly Mustache   Bladder cancer [facility])    recurrent   CAD (coronary artery disease)    cardiac cath 2003  and 2011 both showed normal coronary arteries w/ preserved lvf;  Non obstructive on CTA Oct 2019.    Chronic pain syndrome    back---- [facility] [place]   Cold extremities    BLE  COPD (chronic obstructive pulmonary disease) (HCC)    DDD (degenerative disc disease), lumbar    Diabetic peripheral neuropathy (HCC)    Gastroparesis    followed by dr Marina Goodell   GERD (gastroesophageal reflux disease)    Hiatal hernia    History of bladder cancer urologist-  previously dr Ronal Fear;  now dr gay   papillay TCC (Ta G1)  s/p TURBT and chemo instillation 2014   History of chest pain 12/2017   heart cath normal   History of encephalopathy 05/27/2015   admission w/ acute encephalopathy thought to be secondary to pain meds and COPD   History of gastric ulcer    History of Helicobacter pylori infection    History of kidney stones    History of TIA (transient ischemic attack) 2008  and 10-19-2018    no residual's   History of traumatic head  injury 2010   w/ LOC  per pt needed stitches, hit in head with a mower blade   Hyperlipidemia    Hypertension    Hypogonadism male    s/p  bilateral orchiectomy   Hypothyroidism    Insomnia    Mild obstructive sleep apnea    study in epic 12-04-2016, no cpap   PTSD (post-traumatic stress disorder)    chronic   PTSD (post-traumatic stress disorder)    RA (rheumatoid arthritis) (HCC)    followed by [facility]   Seizures, transient [facility]) neurologist-  dr Terrace Arabia--  differential dx complex partial seizure .vs.  mood disorder .vs.  pseudoseizure--  negative EEG's   confusion episodes and staring spells since 11/ 2015   (03-26-2020 per pt wife last seizure 10 /2021)   Suicide attempt by drug overdose (HCC) 07/28/2021   Transient confusion NEUOROLOGIST-  DR Terrace Arabia   Episodes since 11/ 2015--  neurologist dx  differential complex partial seizure  .vs. mood disorder . vs. pseudoseizure   Type 2 diabetes mellitus treated with insulin [facility])    endocrinologist--- dr Everardo All---  (03-26-2020 pt does not check blood sugar at home)    Allergies Allergies  Allergen Reactions   Celecoxib Anaphylaxis, Swelling and Rash    Tongue swells   Hydrocodone Rash and Other (See Comments)    "blisters developed on arms"    Sulfa Antibiotics Rash   Sulfacetamide Sodium Rash    IV Location/Drains/Wounds Patient Lines/Drains/Airways Status     Active Line/Drains/Airways     Name Placement date Placement time Site Days   Peripheral IV 01/18/23 20 G 1.88" Anterior;Left;Proximal Forearm 01/18/23  1254  Forearm  less than 1   Urethral Catheter Terie Purser RN Straight-tip 16 Fr. 10/29/22  2004  Straight-tip  81   Suprapubic Catheter Triple-lumen 14 Fr. 12/26/22  1207  Triple-lumen  23   Wound / Incision (Open or Dehisced) Finger (Comment which one) Right;Other (Comment) opened non healing wound between index and thumb of right hand --  --  Finger (Comment which one)  --             Labs/Imaging Results for orders placed or performed during the hospital encounter of 01/18/23 (from the past 48 hour(s))  CBG monitoring, ED     Status: Abnormal   Collection Time: 01/18/23 11:24 AM  Result Value Ref Range   Glucose-Capillary 415 (H) 70 - 99 mg/dL    Comment: Glucose reference range applies only to samples taken after fasting for at least 8 hours.  Comprehensive metabolic panel     Status: Abnormal   Collection  Time: 01/18/23 12:58 PM  Result Value Ref Range   Sodium 138 135 - 145 mmol/L   Potassium 3.5 3.5 - 5.1 mmol/L   Chloride 103 98 - 111 mmol/L   CO2 26 22 - 32 mmol/L   Glucose, Bld 180 (H) 70 - 99 mg/dL    Comment: Glucose reference range applies only to samples taken after fasting for at least 8 hours.   BUN 12 6 - 20 mg/dL   Creatinine, Ser 1.61 0.61 - 1.24 mg/dL   Calcium 9.6 8.9 - 09.6 mg/dL   Total Protein 7.2 6.5 - 8.1 g/dL   Albumin 3.4 (L) 3.5 - 5.0 g/dL   AST 14 (L) 15 - 41 U/L   ALT 13 0 - 44 U/L   Alkaline Phosphatase 114 38 - 126 U/L   Total Bilirubin 0.4 <1.2 mg/dL   GFR, Estimated >04 >54 mL/min    Comment: (NOTE) Calculated using the CKD-EPI Creatinine Equation (2021)    Anion gap 9 5 - 15    Comment: Performed at [facility], [address] [place], [place] [address]  CBC with Differential     Status: Abnormal   Collection Time: 01/18/23 12:58 PM  Result Value Ref Range   WBC 8.2 4.0 - 10.5 K/uL   RBC 4.84 4.22 - 5.81 MIL/uL   Hemoglobin 13.2 13.0 - 17.0 g/dL   HCT 91.4 (L) 78.2 - 95.6 %   MCV 79.5 (L) 80.0 - 100.0 fL   MCH 27.3 26.0 - 34.0 pg   MCHC 34.3 30.0 - 36.0 g/dL   RDW 21.3 08.6 - 57.8 %   Platelets 408 (H) 150 - 400 K/uL   nRBC 0.0 0.0 - 0.2 %   Neutrophils Relative % 57 %   Neutro Abs 4.7 1.7 - 7.7 K/uL   Lymphocytes Relative 32 %   Lymphs Abs 2.6 0.7 - 4.0 K/uL   Monocytes Relative 8 %   Monocytes Absolute 0.7 0.1 - 1.0 K/uL   Eosinophils Relative 1 %   Eosinophils Absolute 0.1 0.0 - 0.5  K/uL   Basophils Relative 1 %   Basophils Absolute 0.1 0.0 - 0.1 K/uL   Immature Granulocytes 1 %   Abs Immature Granulocytes 0.09 (H) 0.00 - 0.07 K/uL    Comment: Performed at [facility], [address], [place], [place] [address]   CT ABDOMEN PELVIS W CONTRAST  Result Date: 01/18/2023 CLINICAL DATA:  Status post suprapubic bladder catheter. Abdominal pain EXAM: CT ABDOMEN AND PELVIS WITH CONTRAST TECHNIQUE: Multidetector CT imaging of the abdomen and pelvis was performed using the standard protocol following bolus administration of intravenous contrast. RADIATION DOSE REDUCTION: This exam was performed according to the departmental dose-optimization program which includes automated exposure control, adjustment of the mA and/or kV according to patient size and/or use of iterative reconstruction technique. CONTRAST:  OMNIPAQUE IOHEXOL 300 MG/ML  SOLN COMPARISON:  Procedure CT 12/26/2022.  Standard CT 06/21/2022. FINDINGS: Lower chest: There is some linear opacity lung bases likely scar or atelectasis. No pleural effusion. Slight breathing motion at the lung bases. Coronary artery calcifications are seen. Please correlate for other coronary risk factors. Hepatobiliary: No focal liver abnormality is seen. Status post cholecystectomy. No biliary dilatation. Patent portal vein. Pancreas: Global atrophy of the pancreas.  No obvious mass. Spleen: Normal in size without focal abnormality. Adrenals/Urinary Tract: Adrenal glands are preserved. Small lower pole right-sided benign-appearing Bosniak 2 cyst. No specific follow-up. The ureters have normal course and  caliber down to the bladder. Suprapubic catheter in place. The bladder is underdistended. There is significant bladder wall thickening there is some air in the lumen. Along the course of the catheter is a small fluid collection with the air. The collection likely has wall enhancement. This measures 6.7 by 3.7 cm. Please correlate  for any evidence of a urine leak. Please correlate for abscess formation. This does track superiorly along the course of the rectus abdominis muscle itself as seen on sagittal series 9, image 79. Stomach/Bowel: Stomach is within normal limits. Appendix appears normal. No evidence of bowel wall thickening, distention, or inflammatory changes. Vascular/Lymphatic: Scattered vascular calcifications. Normal caliber aorta and IVC. There are some prominent retroperitoneal nodes seen at the level of the celiac axis. These are are more prominent than usually seen but not pathologic by size criteria. These could be reactive. Reproductive: Prostate is unremarkable. Other: No free air or free fluid. Musculoskeletal: Scattered degenerative changes along the spine and pelvis. There is some sclerosis of the S1 vertebral level, unchanged from prior CT scan. IMPRESSION: Suprapubic catheter in place. Along the course of the catheter along the pelvic wall is a presumed rim enhancing fluid and gas collection measuring up to 6.7 x 3.7 cm. This tracks along the muscle belly of the rectus abdominis muscle in this location superiorly. Please correlate for any evidence of leak along the catheter versus infection or abscess or both. No additional fluid collections identified. No bowel obstruction free air. Fatty liver infiltration. Electronically Signed   By: Karen Kays M.D.   On: 01/18/2023 16:30    Pending Labs Unresulted Labs (From admission, onward)     Start     Ordered   01/19/23 0500  Comprehensive metabolic panel  Tomorrow morning,   R        01/18/23 1649   01/19/23 0500  CBC  Tomorrow morning,   R        01/18/23 1649            Vitals/Pain Today's Vitals   01/18/23 1253 01/18/23 1304 01/18/23 1345 01/18/23 1530  BP:   (!) 123/96 (!) 150/76  Pulse: 80 84 80 83  Resp:   20 18  Temp:  (!) 97 F (36.1 C) 97.8 F (36.6 C)   TempSrc:   Oral   SpO2: 98% 98% 100% 98%  Weight:      Height:      PainSc:         Isolation Precautions No active isolations  Medications Medications  vancomycin (VANCOREADY) IVPB 1500 mg/300 mL (has no administration in time range)  heparin injection 5,000 Units (has no administration in time range)  sodium chloride flush (NS) 0.9 % injection 3 mL (has no administration in time range)  0.9 %  sodium chloride infusion (has no administration in time range)  oxyCODONE (Oxy IR/ROXICODONE) immediate release tablet 5 mg (has no administration in time range)  lamoTRIgine (LAMICTAL) tablet 150 mg (has no administration in time range)  hydrOXYzine (ATARAX) tablet 25 mg (has no administration in time range)  pregabalin (LYRICA) capsule 200 mg (has no administration in time range)  DULoxetine (CYMBALTA) DR capsule 60 mg (has no administration in time range)  venlafaxine XR (EFFEXOR-XR) 24 hr capsule 150 mg (has no administration in time range)  insulin glargine (2 Unit Dial) (TOUJEO MAX) Solostar Pen SOPN 15 Units (has no administration in time range)  lurasidone (LATUDA) tablet 40 mg (has no administration in time range)  carvedilol (COREG)  tablet 6.25 mg (has no administration in time range)  amLODipine (NORVASC) tablet 10 mg (has no administration in time range)  nicotine (NICODERM CQ - dosed in mg/24 hours) patch 21 mg (has no administration in time range)  pantoprazole (PROTONIX) EC tablet 40 mg (has no administration in time range)  sodium chloride 0.9 % bolus 1,000 mL (1,000 mLs Intravenous New Bag/Given 01/18/23 1326)  iohexol (OMNIPAQUE) 300 MG/ML solution 100 mL (100 mLs Intravenous Contrast Given 01/18/23 1417)  cefTRIAXone (ROCEPHIN) 2 g in sodium chloride 0.9 % 100 mL IVPB (2 g Intravenous New Bag/Given 01/18/23 1617)    Mobility non-ambulatory

## 2023-01-18 NOTE — ED Notes (Signed)
Please contact patient's sister, his caregiver, with any updates on pt.  She is requesting he be sent back via [facility] if he is discharged.

## 2023-01-18 NOTE — Progress Notes (Signed)
A consult was received from an ED physician for Vancomycin per pharmacy dosing.  The patient's profile has been reviewed for ht/wt/allergies/indication/available labs.   A one time order has been placed for Vanco 1500mg  IV x 1.  Further antibiotics/pharmacy consults should be ordered by admitting physician if indicated.                        Jil Penland S. Merilynn Finland, PharmD, BCPS Clinical Staff Pharmacist [email or web address] Thank you, Pasty Spillers 01/18/2023  4:15 PM

## 2023-01-18 NOTE — ED Provider Notes (Signed)
[place] EMERGENCY DEPARTMENT AT [facility] Provider Note  CSN: [id number] Arrival date & time: 01/18/23 1111  Chief Complaint(s) Abscess  HPI NEGAN GRUDZIEN is a 57 y.o. male history of bipolar disorder, COPD, diabetes, seizure disorder presenting to the emergency department with suprapubic catheter issue.  Patient recently was diagnosed with urinary retention.  Had Foley catheter, was apparently told by urologist that he would need suprapubic catheter.  This was performed October 15.  Since then patient has had purulent drainage emerging from the suprapubic catheter site.  Has apparently been on cefdinir.  At 1 point was also prescribed Bactrim but he is allergic to this so did not take it.  No fevers.  Reports some pain around the suprapubic catheter site.  He has some associated nausea.  No vomiting.   Past Medical History Past Medical History:  Diagnosis Date   Anxiety    Benign essential tremor    Bipolar 2 disorder [facility])    followed by [facility]--- dr s. Lolly Mustache   Bladder cancer [facility])    recurrent   CAD (coronary artery disease)    cardiac cath 2003  and 2011 both showed normal coronary arteries w/ preserved lvf;  Non obstructive on CTA Oct 2019.    Chronic pain syndrome    back---- [place] [facility] [place]   Cold extremities    BLE   COPD (chronic obstructive pulmonary disease) (HCC)    DDD (degenerative disc disease), lumbar    Diabetic peripheral neuropathy (HCC)    Gastroparesis    followed by dr Marina Goodell   GERD (gastroesophageal reflux disease)    Hiatal hernia    History of bladder cancer urologist-  previously dr Ronal Fear;  now dr gay   papillay TCC (Ta G1)  s/p TURBT and chemo instillation 2014   History of chest pain 12/2017   heart cath normal   History of encephalopathy 05/27/2015   admission w/ acute encephalopathy thought to be secondary to pain meds and COPD   History of gastric ulcer    History of Helicobacter pylori infection     History of kidney stones    History of TIA (transient ischemic attack) 2008  and 10-19-2018    no residual's   History of traumatic head injury 2010   w/ LOC  per pt needed stitches, hit in head with a mower blade   Hyperlipidemia    Hypertension    Hypogonadism male    s/p  bilateral orchiectomy   Hypothyroidism    Insomnia    Mild obstructive sleep apnea    study in epic 12-04-2016, no cpap   PTSD (post-traumatic stress disorder)    chronic   PTSD (post-traumatic stress disorder)    RA (rheumatoid arthritis) (HCC)    followed by [facility]   Seizures, transient [facility]) neurologist-  dr Terrace Arabia--  differential dx complex partial seizure .vs.  mood disorder .vs.  pseudoseizure--  negative EEG's   confusion episodes and staring spells since 11/ 2015   (03-26-2020 per pt wife last seizure 10 /2021)   Suicide attempt by drug overdose (HCC) 07/28/2021   Transient confusion NEUOROLOGIST-  DR Terrace Arabia   Episodes since 11/ 2015--  neurologist dx  differential complex partial seizure  .vs. mood disorder . vs. pseudoseizure   Type 2 diabetes mellitus treated with insulin [facility])    endocrinologist--- dr Everardo All---  (03-26-2020 pt does not check blood sugar at home)   Patient Active Problem List  Diagnosis Date Noted   Sepsis with acute renal failure (HCC) 09/25/2022   Abscess, gluteal, right 09/25/2022   Peripheral neuropathy in hands 06/08/2022   Essential hypertension 05/23/2022   BPH (benign prostatic hyperplasia) 05/23/2022   AKI (acute kidney injury) (HCC) 09/04/2021   Hypotension 09/03/2021   History of seizure 08/02/2021   Cerebral thrombosis with cerebral infarction 11/16/2020   Ischemic stroke (HCC) 06/10/2020   Pain due to onychomycosis of toenails of both feet 01/28/2020   Hyperglycemia due to type 2 diabetes mellitus (HCC) 01/07/2020   Resistance to insulin 01/07/2020   Right leg weakness 04/24/2019   Right leg pain 04/24/2019   Gait abnormality 04/24/2019   TIA  (transient ischemic attack) 10/20/2018   DM2 (diabetes mellitus, type 2) (HCC) 10/19/2018   Left sided numbness 10/19/2018   Diabetic autonomic neuropathy associated with type 2 diabetes mellitus (HCC) 02/01/2018   DM type 2 with diabetic peripheral neuropathy (HCC) 02/01/2018   Dyslipidemia 01/31/2018   Coronary artery disease involving native coronary artery of native heart without angina pectoris 01/31/2018   Chest pain 01/01/2018   Breakthrough seizure (HCC) 12/19/2016   Essential tremor 12/19/2016   Chronic post-traumatic stress disorder (PTSD) 06/16/2016   Tremor 06/16/2016   Tobacco abuse 05/05/2016   Altered mental status 05/28/2015   Type 2 diabetes mellitus with hyperglycemia (HCC) 05/28/2015   Hypothyroidism 05/28/2015   Bipolar 2 disorder (HCC) 05/28/2015   History of rheumatoid arthritis 05/28/2015   Chronic pain 05/28/2015   Bipolar II disorder, most recent episode major depressive (HCC) 03/18/2015   Carpal tunnel syndrome on left 02/10/2015   Carpal tunnel syndrome on right 02/10/2015   Diabetes (HCC) 01/23/2015   Obesity (BMI 30-39.9) 03/06/2014   Acute encephalopathy 03/05/2014   Hyperlipidemia 03/05/2014   Anemia, normocytic normochromic 03/05/2014   Altered mental state    Helicobacter pylori (H. pylori) infection 11/20/2012   Protein-calorie malnutrition, severe (HCC) 10/09/2012   Dysphagia 08/19/2012   Gastroparesis 08/19/2012   Bladder tumor 08/08/2012   Biliary dyskinesia 11/02/2010   Home Medication(s) Prior to Admission medications   Medication Sig Start Date End Date Taking? Authorizing Provider  acetaminophen (TYLENOL) 325 MG tablet Take 2 tablets (650 mg total) by mouth every 6 (six) hours as needed for mild pain (or Fever >/= 101). 08/29/22   Marguerita Merles Latif, DO  amLODipine (NORVASC) 10 MG tablet Take 1 tablet (10 mg total) by mouth daily. 10/01/22   Leroy Sea, MD  baclofen (LIORESAL) 10 MG tablet Take 10 mg by mouth 3 (three) times  daily. Sister only gives it to Patient at Bedtime.    [provider]  carvedilol (COREG) 6.25 MG tablet Take 1 tablet (6.25 mg total) by mouth 2 (two) times daily with a meal. 09/30/22   Leroy Sea, MD  clopidogrel (PLAVIX) 75 MG tablet Take 75 mg by mouth daily. Wife called and spoke with nurse.  This is a medication the patient takes at home that was not entered at admission. Dr. Sibyl Parr informed.    [provider]  DULoxetine (CYMBALTA) 60 MG capsule Take 60 mg by mouth daily. Wife called and spoke with nurse.  This is a medication the patient takes at home that was not entered at admission. Dr. Sibyl Parr informed.    [provider]  ezetimibe (ZETIA) 10 MG tablet Take 1 tablet (10 mg total) by mouth daily. 10/01/22   Leroy Sea, MD  HUMALOG KWIKPEN 200 UNIT/ML KwikPen Inject 20 Units into the skin  in the morning, at noon, and at bedtime. Patient taking differently: Inject 35 Units into the skin in the morning, at noon, and at bedtime. Sister stated Dr. Catalina Pizza her 35 units on both Insulin Pens 08/29/22   Marguerita Merles Latif, DO  hydroxychloroquine (PLAQUENIL) 200 MG tablet Take 200 mg by mouth 2 (two) times daily. Wife called and spoke with nurse.  This is a medication the patient takes at home that was not entered at admission. Dr. Sibyl Parr informed.    [provider]  hydrOXYzine (ATARAX) 25 MG tablet Take 25 mg by mouth at bedtime as needed for anxiety or itching. 08/01/22   [provider]  insulin glargine, 2 Unit Dial, (TOUJEO MAX SOLOSTAR) 300 UNIT/ML Solostar Pen Inject 40 Units into the skin 2 (two) times daily at 8 am and 10 pm. Patient taking differently: Inject 35 Units into the skin 2 (two) times daily at 8 am and 10 pm. Sister stated Dr. Has Patient using 35 units on both insulin pens. 08/29/22   Marguerita Merles Latif, DO  lamoTRIgine (LAMICTAL) 150 MG tablet Take 150 mg by mouth 2 (two) times daily. Wife called and spoke with nurse.  This is a  medication the patient takes at home that was not entered at admission. Dr. Sibyl Parr informed.    [provider]  linezolid (ZYVOX) 600 MG tablet Take 1 tablet (600 mg total) by mouth 2 (two) times daily. 09/30/22   Leroy Sea, MD  lurasidone (LATUDA) 20 MG TABS tablet Take 40 mg by mouth at bedtime.    [provider]  nicotine (NICODERM CQ - DOSED IN MG/24 HOURS) 21 mg/24hr patch Place 1 patch (21 mg total) onto the skin daily. 08/30/22   Marguerita Merles Latif, DO  nystatin (MYCOSTATIN/NYSTOP) powder Apply 1 Application topically 3 (three) times daily. 10/29/22   Melene Plan, DO  omeprazole (PRILOSEC) 40 MG capsule Take 40 mg by mouth daily. 08/23/21   [provider]  oxyCODONE (OXY IR/ROXICODONE) 5 MG immediate release tablet Take 1 tablet (5 mg total) by mouth every 12 (twelve) hours as needed for severe pain. 09/30/22   Leroy Sea, MD  pregabalin (LYRICA) 200 MG capsule Take 200 mg by mouth 3 (three) times daily. 12/25/21   Dois Davenport, MD  rOPINIRole (REQUIP) 0.5 MG tablet Take 0.5 mg by mouth at bedtime. 07/25/22   [provider]  rosuvastatin (CRESTOR) 20 MG tablet Take 1 tablet (20 mg total) by mouth at bedtime. 09/30/22   Leroy Sea, MD  tamsulosin (FLOMAX) 0.4 MG CAPS capsule Take 0.4 mg by mouth at bedtime.    [provider]  venlafaxine XR (EFFEXOR-XR) 150 MG 24 hr capsule Take 150 mg by mouth daily with breakfast. Wife called and spoke with nurse.  This is a medication the patient takes at home that was not entered at admission. Dr. Sibyl Parr informed.    [provider]  Past Surgical History Past Surgical History:  Procedure Laterality Date   AMPUTATION Left 04/28/2020   Procedure: LEFT LITTLE FINGER AMPUTATION;  Surgeon: Nadara Mustard, MD;  Location: [facility] OR;  Service: Orthopedics;   Laterality: Left;   CARDIAC CATHETERIZATION  12-27-2001  DR Jacinto Halim  &  05-26-2009  DR Eldridge Dace   RESULTS FOR BOTH ARE NORMAL CORONARIES AND PERSERVED LVF/ EF 60%   CARPAL TUNNEL RELEASE Bilateral right 09-16-2003;  left ?   CARPAL TUNNEL RELEASE Left 02/25/2015   Procedure: LEFT CARPAL TUNNEL RELEASE;  Surgeon: Betha Loa, MD;  Location: [facility];  Service: Orthopedics;  Laterality: Left;   CYSTOSCOPY N/A 10/10/2012   Procedure: CYSTOSCOPY CLOT EVACUATION FULGERATION OF BLEEDERS ;  Surgeon: Garnett Farm, MD;  Location: [facility];  Service: Urology;  Laterality: N/A;   CYSTOSCOPY WITH BIOPSY N/A 11/26/2015   Procedure: CYSTOSCOPY WITH BIOPSY AND FULGURATION;  Surgeon: Ihor Gully, MD;  Location[facility];  Service: Urology;  Laterality: N/A;   ESOPHAGOGASTRODUODENOSCOPY  2014   IRRIGATION AND DEBRIDEMENT ABSCESS Right 09/27/2022   Procedure: IRRIGATION AND DEBRIDEMENT OF RIGHT BUTTOCK  ABSCESS;  Surgeon: Griselda Miner, MD;  Location: [facility] OR;  Service: General;  Laterality: Right;   LAPAROSCOPIC CHOLECYSTECTOMY  11-17-2010   ORCHIECTOMY Right 02/21/2016   Procedure: SCROTAL ORCHIECTOMY with TESTICULAR PROSTHESIS IMPLANT;  Surgeon: Ihor Gully, MD;  Location: [facility] [employer];  Service: Urology;  Laterality: Right;   ORCHIECTOMY Left 09/02/2018   Procedure: ORCHIECTOMY;  Surgeon: Ihor Gully, MD;  Location: [facility];  Service: Urology;  Laterality: Left;   ROTATOR CUFF REPAIR Right 12/2004   TRANSURETHRAL RESECTION OF BLADDER TUMOR N/A 08/09/2012   Procedure: TRANSURETHRAL RESECTION OF BLADDER TUMOR (TURBT) WITH GYRUS WITH MITOMYCIN C;  Surgeon: Garnett Farm, MD;  Location: [facility];  Service: Urology;  Laterality: N/A;   TRANSURETHRAL RESECTION OF BLADDER TUMOR N/A 03/29/2020   Procedure: TRANSURETHRAL RESECTION OF BLADDER TUMOR (TURBT) and post-op instillation of gemcitabine;  Surgeon: [name], MD;  Location: [facility];  Service: Urology;  Laterality: N/A;   TRANSURETHRAL RESECTION OF BLADDER TUMOR WITH GYRUS (TURBT-GYRUS) N/A 02/27/2014   Procedure: TRANSURETHRAL RESECTION OF BLADDER TUMOR WITH GYRUS (TURBT-GYRUS);  Surgeon: Garnett Farm, MD;  [facility];  Service: Urology;  Laterality: N/A;   Family History Family History  Problem Relation Age of Onset   Diabetes Mother    Diabetes Father    Hypertension Father    Heart attack Father 33       died age 45   Alcohol abuse Father    Colon cancer Neg Hx    Esophageal cancer Neg Hx    Stomach cancer Neg Hx    Rectal cancer Neg Hx     Social History Social History   Tobacco Use   Smoking status: Every Day    Current packs/day: 1.50    Average packs/day: 1.5 packs/day for 38.0 years (57.0 ttl pk-yrs)    Types: Cigarettes   Smokeless tobacco: Never  Vaping Use   Vaping status: Never Used  Substance Use Topics   Alcohol use: No   Drug use: No   Allergies Celecoxib, Hydrocodone, Sulfa antibiotics, and Sulfacetamide sodium  Review of Systems Review of Systems  All other systems reviewed and are negative.   Physical Exam Vital Signs  I have reviewed the triage vital signs BP (!) 123/96 (BP Location: Right Arm)  Pulse 80   Temp 97.8 F (36.6 C) (Oral)   Resp 20   Ht 6' (1.829 m)   Wt 83.9 kg   SpO2 100%   BMI 25.09 kg/m  Physical Exam Vitals and nursing note reviewed.  Constitutional:      General: He is not in acute distress.    Appearance: Normal appearance.  HENT:     Mouth/Throat:     Mouth: Mucous membranes are moist.  Eyes:     Conjunctiva/sclera: Conjunctivae normal.  Cardiovascular:     Rate and Rhythm: Normal rate and regular rhythm.  Pulmonary:     Effort: Pulmonary effort is normal. No respiratory distress.     Breath sounds: Normal breath sounds.  Abdominal:     General: Abdomen is flat.     Palpations: Abdomen is soft.      Comments: Suprapubic catheter site with purulent discharge around catheter which is sutured in place, no significant erythema to the skin, tenderness around suprapubic catheter site, otherwise abdomen soft without tenderness.  Musculoskeletal:     Right lower leg: No edema.     Left lower leg: No edema.  Skin:    General: Skin is warm and dry.     Capillary Refill: Capillary refill takes less than 2 seconds.  Neurological:     Mental Status: He is alert and oriented to person, place, and time. Mental status is at baseline.  Psychiatric:        Mood and Affect: Mood normal.        Behavior: Behavior normal.     ED Results and Treatments Labs (all labs ordered are listed, but only abnormal results are displayed) Labs Reviewed  COMPREHENSIVE METABOLIC PANEL - Abnormal; Notable for the following components:      Result Value   Glucose, Bld 180 (*)    Albumin 3.4 (*)    AST 14 (*)    All other components within normal limits  CBC WITH DIFFERENTIAL/PLATELET - Abnormal; Notable for the following components:   HCT 38.5 (*)    MCV 79.5 (*)    Platelets 408 (*)    Abs Immature Granulocytes 0.09 (*)    All other components within normal limits  CBG MONITORING, ED - Abnormal; Notable for the following components:   Glucose-Capillary 415 (*)    All other components within normal limits                                                                                                                          Radiology CT ABDOMEN PELVIS W CONTRAST  Result Date: 01/18/2023 CLINICAL DATA:  Status post suprapubic bladder catheter. Abdominal pain EXAM: CT ABDOMEN AND PELVIS WITH CONTRAST TECHNIQUE: Multidetector CT imaging of the abdomen and pelvis was performed using the standard protocol following bolus administration of intravenous contrast. RADIATION DOSE REDUCTION: This exam was performed according to the departmental dose-optimization program which includes automated exposure control,  adjustment of the mA and/or kV according  to patient size and/or use of iterative reconstruction technique. CONTRAST:  OMNIPAQUE IOHEXOL 300 MG/ML  SOLN COMPARISON:  Procedure CT 12/26/2022.  Standard CT 06/21/2022. FINDINGS: Lower chest: There is some linear opacity lung bases likely scar or atelectasis. No pleural effusion. Slight breathing motion at the lung bases. Coronary artery calcifications are seen. Please correlate for other coronary risk factors. Hepatobiliary: No focal liver abnormality is seen. Status post cholecystectomy. No biliary dilatation. Patent portal vein. Pancreas: Global atrophy of the pancreas.  No obvious mass. Spleen: Normal in size without focal abnormality. Adrenals/Urinary Tract: Adrenal glands are preserved. Small lower pole right-sided benign-appearing Bosniak 2 cyst. No specific follow-up. The ureters have normal course and caliber down to the bladder. Suprapubic catheter in place. The bladder is underdistended. There is significant bladder wall thickening there is some air in the lumen. Along the course of the catheter is a small fluid collection with the air. The collection likely has wall enhancement. This measures 6.7 by 3.7 cm. Please correlate for any evidence of a urine leak. Please correlate for abscess formation. This does track superiorly along the course of the rectus abdominis muscle itself as seen on sagittal series 9, image 79. Stomach/Bowel: Stomach is within normal limits. Appendix appears normal. No evidence of bowel wall thickening, distention, or inflammatory changes. Vascular/Lymphatic: Scattered vascular calcifications. Normal caliber aorta and IVC. There are some prominent retroperitoneal nodes seen at the level of the celiac axis. These are are more prominent than usually seen but not pathologic by size criteria. These could be reactive. Reproductive: Prostate is unremarkable. Other: No free air or free fluid. Musculoskeletal: Scattered degenerative  changes along the spine and pelvis. There is some sclerosis of the S1 vertebral level, unchanged from prior CT scan. IMPRESSION: Suprapubic catheter in place. Along the course of the catheter along the pelvic wall is a presumed rim enhancing fluid and gas collection measuring up to 6.7 x 3.7 cm. This tracks along the muscle belly of the rectus abdominis muscle in this location superiorly. Please correlate for any evidence of leak along the catheter versus infection or abscess or both. No additional fluid collections identified. No bowel obstruction free air. Fatty liver infiltration. Electronically Signed   By: Karen Kays M.D.   On: 01/18/2023 16:30    Pertinent labs & imaging results that were available during my care of the patient were reviewed by me and considered in my medical decision making (see MDM for details).  Medications Ordered in ED Medications  cefTRIAXone (ROCEPHIN) 2 g in sodium chloride 0.9 % 100 mL IVPB (2 g Intravenous New Bag/Given 01/18/23 1617)  vancomycin (VANCOREADY) IVPB 1500 mg/300 mL (has no administration in time range)  sodium chloride 0.9 % bolus 1,000 mL (1,000 mLs Intravenous New Bag/Given 01/18/23 1326)  iohexol (OMNIPAQUE) 300 MG/ML solution 100 mL (100 mLs Intravenous Contrast Given 01/18/23 1417)  Procedures Procedures  (including critical care time)  Medical Decision Making / ED Course   MDM:  57 year old male presenting to the emergency department with drainage from suprapubic catheter site.  Patient overall well-appearing, on physical examination he has a suprapubic catheter in place with purulent drainage emerging from his suprapubic catheter site.  He has some tenderness around this.  CT scan was performed which demonstrates on my interpretation a large fluid collection on the root of the suprapubic catheter.  Discussed  with [facility] general surgery PA who requests discussion with urology.  I discussed with Dr. Mena Goes, they will consult, look at the images.  They think that general surgery can hold off for now.  Will give antibiotics, vancomycin and ceftriaxone.  Patient has grown MRSA on previous admission for abscess.  Given comorbidities, believe patient will need to be admitted.  Will discuss with the hospitalist.  Clinical Course as of 01/18/23 1646  Thu Jan 18, 2023  1630 CT scan does show abscess. Discussed with hosptialist Dr. Mahala Menghini who will admit patient.  [WS]    Clinical Course User Index [WS] Lonell Grandchild, MD     Additional history obtained: -Additional history obtained from family -External records from outside source obtained and reviewed including: Chart review including previous notes, labs, imaging, consultation notes including IR note    Lab Tests: -I ordered, reviewed, and interpreted labs.   The pertinent results include:   Labs Reviewed  COMPREHENSIVE METABOLIC PANEL - Abnormal; Notable for the following components:      Result Value   Glucose, Bld 180 (*)    Albumin 3.4 (*)    AST 14 (*)    All other components within normal limits  CBC WITH DIFFERENTIAL/PLATELET - Abnormal; Notable for the following components:   HCT 38.5 (*)    MCV 79.5 (*)    Platelets 408 (*)    Abs Immature Granulocytes 0.09 (*)    All other components within normal limits  CBG MONITORING, ED - Abnormal; Notable for the following components:   Glucose-Capillary 415 (*)    All other components within normal limits    Notable for no leukocytosis      Imaging Studies ordered: I ordered imaging studies including CT A/P  On my interpretation imaging demonstrates abscess  I independently visualized and interpreted imaging. I agree with the radiologist interpretation   Medicines ordered and prescription drug management: Meds ordered this encounter  Medications   sodium chloride 0.9 %  bolus 1,000 mL   iohexol (OMNIPAQUE) 300 MG/ML solution 100 mL   cefTRIAXone (ROCEPHIN) 2 g in sodium chloride 0.9 % 100 mL IVPB    Order Specific Question:   Antibiotic Indication:    Answer:   Cellulitis   vancomycin (VANCOREADY) IVPB 1500 mg/300 mL    Order Specific Question:   Indication:    Answer:   Cellulitis    -I have reviewed the patients home medicines and have made adjustments as needed   Consultations Obtained: I requested consultation with the urologist,  and discussed lab and imaging findings as well as pertinent plan - they recommend: admit to medicine    Cardiac Monitoring: The patient was maintained on a cardiac monitor.  I personally viewed and interpreted the cardiac monitored which showed an underlying rhythm of: NSR  Social Determinants of Health:  Diagnosis or treatment significantly limited by social determinants of health: obesity   Reevaluation: After the interventions noted above, I reevaluated the patient and found that their symptoms  have improved  Co morbidities that complicate the patient evaluation  Past Medical History:  Diagnosis Date   Anxiety    Benign essential tremor    Bipolar 2 disorder [facility])    followed by [facility]--- dr s. Lolly Mustache   Bladder cancer [facility])    recurrent   CAD (coronary artery disease)    cardiac cath 2003  and 2011 both showed normal coronary arteries w/ preserved lvf;  Non obstructive on CTA Oct 2019.    Chronic pain syndrome    back---- [place] [facility] [place]   Cold extremities    BLE   COPD (chronic obstructive pulmonary disease) (HCC)    DDD (degenerative disc disease), lumbar    Diabetic peripheral neuropathy (HCC)    Gastroparesis    followed by dr Marina Goodell   GERD (gastroesophageal reflux disease)    Hiatal hernia    History of bladder cancer urologist-  previously dr Ronal Fear;  now dr gay   papillay TCC (Ta G1)  s/p TURBT and chemo instillation 2014   History of chest pain 12/2017   heart cath  normal   History of encephalopathy 05/27/2015   admission w/ acute encephalopathy thought to be secondary to pain meds and COPD   History of gastric ulcer    History of Helicobacter pylori infection    History of kidney stones    History of TIA (transient ischemic attack) 2008  and 10-19-2018    no residual's   History of traumatic head injury 2010   w/ LOC  per pt needed stitches, hit in head with a mower blade   Hyperlipidemia    Hypertension    Hypogonadism male    s/p  bilateral orchiectomy   Hypothyroidism    Insomnia    Mild obstructive sleep apnea    study in epic 12-04-2016, no cpap   PTSD (post-traumatic stress disorder)    chronic   PTSD (post-traumatic stress disorder)    RA (rheumatoid arthritis) (HCC)    followed by [facility]   Seizures, transient [facility]) neurologist-  dr Terrace Arabia--  differential dx complex partial seizure .vs.  mood disorder .vs.  pseudoseizure--  negative EEG's   confusion episodes and staring spells since 11/ 2015   (03-26-2020 per pt wife last seizure 10 /2021)   Suicide attempt by drug overdose (HCC) 07/28/2021   Transient confusion NEUOROLOGIST-  DR Terrace Arabia   Episodes since 11/ 2015--  neurologist dx  differential complex partial seizure  .vs. mood disorder . vs. pseudoseizure   Type 2 diabetes mellitus treated with insulin [facility])    endocrinologist--- dr Everardo All---  (03-26-2020 pt does not check blood sugar at home)      Dispostion: Disposition decision including need for hospitalization was considered, and patient admitted to the hospital.    Final Clinical Impression(s) / ED Diagnoses Final diagnoses:  Abscess     This chart was dictated using voice recognition software.  Despite best efforts to proofread,  errors can occur which can change the documentation meaning.    Lonell Grandchild, MD 01/18/23 [phone]

## 2023-01-18 NOTE — ED Triage Notes (Signed)
Pt BIBA from home where he lives with his sister/caregiver for pain and discharge around suprapubic catheter that was put in place on 10/15.   Pt endorses pain 6/10, and nausea, recent hospitalization with sepsis and taking clindamycin antibiotics as prescribed  Per EMS had elevated glucose over 500 at home, sister gave pt 50 units of Normal insulin and 20 units of Humalog.   Pt is A&Ox 4 and makes own medical decisions but has some generalized deficits from a previous stroke.

## 2023-01-19 ENCOUNTER — Inpatient Hospital Stay (HOSPITAL_COMMUNITY): Payer: 59

## 2023-01-19 DIAGNOSIS — Z87898 Personal history of other specified conditions: Secondary | ICD-10-CM | POA: Diagnosis not present

## 2023-01-19 HISTORY — PX: IR US GUIDE BX ASP/DRAIN: IMG2392

## 2023-01-19 LAB — CBC
HCT: 35 % — ABNORMAL LOW (ref 39.0–52.0)
HCT: 35.5 % — ABNORMAL LOW (ref 39.0–52.0)
Hemoglobin: 11.6 g/dL — ABNORMAL LOW (ref 13.0–17.0)
Hemoglobin: 11.6 g/dL — ABNORMAL LOW (ref 13.0–17.0)
MCH: 26.9 pg (ref 26.0–34.0)
MCH: 26.7 pg (ref 26.0–34.0)
MCHC: 33.1 g/dL (ref 30.0–36.0)
MCHC: 32.7 g/dL (ref 30.0–36.0)
MCV: 81.2 fL (ref 80.0–100.0)
MCV: 81.6 fL (ref 80.0–100.0)
Platelets: 321 10*3/uL (ref 150–400)
Platelets: 305 10*3/uL (ref 150–400)
RBC: 4.31 MIL/uL (ref 4.22–5.81)
RBC: 4.35 MIL/uL (ref 4.22–5.81)
RDW: 12.4 % (ref 11.5–15.5)
RDW: 12.4 % (ref 11.5–15.5)
WBC: 5.7 10*3/uL (ref 4.0–10.5)
WBC: 5.7 10*3/uL (ref 4.0–10.5)
nRBC: 0 % (ref 0.0–0.2)
nRBC: 0 % (ref 0.0–0.2)

## 2023-01-19 LAB — COMPREHENSIVE METABOLIC PANEL
ALT: 11 U/L (ref 0–44)
AST: 11 U/L — ABNORMAL LOW (ref 15–41)
Albumin: 2.7 g/dL — ABNORMAL LOW (ref 3.5–5.0)
Alkaline Phosphatase: 82 U/L (ref 38–126)
Anion gap: 9 (ref 5–15)
BUN: 11 mg/dL (ref 6–20)
CO2: 22 mmol/L (ref 22–32)
Calcium: 8.5 mg/dL — ABNORMAL LOW (ref 8.9–10.3)
Chloride: 105 mmol/L (ref 98–111)
Creatinine, Ser: 0.78 mg/dL (ref 0.61–1.24)
GFR, Estimated: >60 mL/min (ref >60)
Glucose, Bld: 170 mg/dL — ABNORMAL HIGH (ref 70–99)
Potassium: 3.3 mmol/L — ABNORMAL LOW (ref 3.5–5.1)
Sodium: 136 mmol/L (ref 135–145)
Total Bilirubin: 0.4 mg/dL (ref <1.2)
Total Protein: 5.6 g/dL — ABNORMAL LOW (ref 6.5–8.1)

## 2023-01-19 LAB — GLUCOSE, CAPILLARY
Glucose-Capillary: 190 mg/dL — ABNORMAL HIGH (ref 70–99)
Glucose-Capillary: 236 mg/dL — ABNORMAL HIGH (ref 70–99)
Glucose-Capillary: 118 mg/dL — ABNORMAL HIGH (ref 70–99)
Glucose-Capillary: 247 mg/dL — ABNORMAL HIGH (ref 70–99)

## 2023-01-19 LAB — PROTIME-INR
INR: 0.9 (ref 0.8–1.2)
Prothrombin Time: 12.5 s (ref 11.4–15.2)

## 2023-01-19 MED ORDER — FENTANYL CITRATE (PF) 100 MCG/2ML IJ SOLN
INTRAMUSCULAR | Status: AC
  Filled 2023-01-19: qty 4

## 2023-01-19 MED ORDER — MIDAZOLAM HCL 2 MG/2ML IJ SOLN
INTRAMUSCULAR | Status: AC | PRN
  Administered 2023-01-19: 1 mg via INTRAVENOUS

## 2023-01-19 MED ORDER — LIDOCAINE-EPINEPHRINE 1 %-1:100000 IJ SOLN
20.0000 mL | Freq: Once | INTRAMUSCULAR | Status: DC
  Filled 2023-01-19: qty 20

## 2023-01-19 MED ORDER — MIDAZOLAM HCL 2 MG/2ML IJ SOLN
INTRAMUSCULAR | Status: AC
  Filled 2023-01-19: qty 4

## 2023-01-19 MED ORDER — SODIUM CHLORIDE 0.9 % IV SOLN
2.0000 g | INTRAVENOUS | Status: DC
  Administered 2023-01-19 – 2023-01-21 (×3): 2 g via INTRAVENOUS
  Filled 2023-01-19 (×3): qty 20

## 2023-01-19 MED ORDER — INSULIN ASPART 100 UNIT/ML IJ SOLN
3.0000 [IU] | INTRAMUSCULAR | Status: DC
  Administered 2023-01-19 – 2023-01-20 (×5): 3 [IU] via SUBCUTANEOUS

## 2023-01-19 MED ORDER — SODIUM CHLORIDE 0.9% FLUSH
5.0000 mL | Freq: Three times a day (TID) | INTRAVENOUS | Status: DC
  Administered 2023-01-19 – 2023-01-25 (×17): 5 mL

## 2023-01-19 MED ORDER — VANCOMYCIN HCL 1250 MG/250ML IV SOLN
1250.0000 mg | Freq: Two times a day (BID) | INTRAVENOUS | Status: DC
  Administered 2023-01-19 – 2023-01-22 (×6): 1250 mg via INTRAVENOUS
  Filled 2023-01-19 (×6): qty 250

## 2023-01-19 MED ORDER — FENTANYL CITRATE (PF) 100 MCG/2ML IJ SOLN
INTRAMUSCULAR | Status: AC | PRN
  Administered 2023-01-19: 50 ug via INTRAVENOUS

## 2023-01-19 MED ORDER — LIDOCAINE-EPINEPHRINE 1 %-1:100000 IJ SOLN
INTRAMUSCULAR | Status: AC
  Filled 2023-01-19: qty 1

## 2023-01-19 MED ORDER — VANCOMYCIN HCL IN DEXTROSE 1-5 GM/200ML-% IV SOLN
1000.0000 mg | Freq: Two times a day (BID) | INTRAVENOUS | Status: DC
  Administered 2023-01-19: 1000 mg via INTRAVENOUS
  Filled 2023-01-19: qty 200

## 2023-01-19 MED ORDER — SODIUM CHLORIDE 0.9 % IV SOLN
INTRAVENOUS | Status: AC | PRN
  Administered 2023-01-19: 50 mL/h via INTRAVENOUS

## 2023-01-19 NOTE — Procedures (Signed)
Interventional Radiology Procedure Note  Procedure: US guided placement of a 39F drain into the rectus abscess  Complications: None  Estimated Blood Loss: None  Recommendations: - Flush drain TID    Signed,  Sterling Big, MD

## 2023-01-19 NOTE — Progress Notes (Signed)
Pharmacy Antibiotic Note  Vincent [name] is a 57 y.o. male with nonmuscle invasive bladder cancer and areflexic bladder with suprapubic catheter placed on 12/26/22. He developed drainage around suprapubic area shortly after catheter placement and prescribed cefdinir on 01/04/23 x14 days for superficial wound infection. He presented to the ED on 01/18/2023 with c/o of pain and discharge around the suprapubic catheter. Abd/pelvis CT on 01/18/23 showed findings that were consistent with a suprapubic abscess. He is currently on vancomycin and ceftriaxone for infection.    Today, 01/19/2023: - day #2 abx - afeb - wbc wnl - scr down 0.78   Plan: - adjust vancomycin to 1250 mg q12h for est AUC 469 - Ceftriaxone 2gm IV q24h per MD  __________________________________________  Height: 6' (182.9 cm) Weight: 76.2 kg (167 lb 15.9 oz) IBW/kg (Calculated) : 77.6  Temp (24hrs), Avg:97.8 F (36.6 C), Min:97 F (36.1 C), Max:98.7 F (37.1 C)  Recent Labs  Lab 01/18/23 1258 01/19/23 0633  WBC 8.2 5.7  CREATININE 1.07 0.78    Estimated Creatinine Clearance: 112.4 mL/min (by C-G formula based on SCr of 0.78 mg/dL).    Allergies  Allergen Reactions   Celecoxib Anaphylaxis, Swelling, Rash and Other (See Comments)    Tongue swells   Hydrocodone Rash and Other (See Comments)    "Blisters developed on arms"; tolerated Norco okay (per sister)    Sulfa Antibiotics Rash     Thank you for allowing pharmacy to be a part of this patient's care.  Lucia Gaskins 01/19/2023 8:50 AM

## 2023-01-19 NOTE — Progress Notes (Signed)
HOSPITALIST ROUNDING NOTE Livingston Larocca Azevedo VHQ:[id number]  DOB: 26-Mar-1965  DOA: 01/18/2023  PCP: Dois Davenport, MD  01/19/2023,4:19 PM   LOS: 1 day      Code Status: Full From: Home  current Dispo: Home     57 wm known poorly controlled diabetes complicated gastroparesis neuropathy-has been diabetic since his early 68s Anxiety disorder Bipolar 2 with prior suicidal attempt Chronic pain syndrome Reflux Prior stroke status post tPA 05/2020--he has been admitted several times for focal neurological deficits with hyperglycemia additionally-at last visit he was placed on Plavix alone COPD Previous seizures on Lamictal Previous bladder cancer Benign essential tremor   Recent right gluteal cleft abscess status post incision and drainage-underwent general surgery evaluation 09/27/2022 and was discharged on 09/30/2022   Brought in from home pain discharge around suprapubic cath that was placed on 10/15 [had been on cefdinir ]by IR endorsing 6/10 pain with CBGs >500 WBC 8.2 hemoglobin 13 platelet 408 BUN/creatinine 12/1.0, sodium 138   General Surgery, urology consulted   CT abdomen pelvis returned showing 6.7 X3.7 rim-enhancing fluid gas collection along the belly of the rectus abdominis superiorly surrounding the suprapubic catheter 01/19/2023 IR consulted US guided placement of 12 French drain into rectus abscess performed  Plan  Suprapubic catheter abscess failed outpatient management Appreciative of Dr. Mena Goes input/IR care IR placed a drain await cultures continue ceftriaxone vancomycin until preliminary data reported Trend CBC  Bipolar with suicidality resume hydroxyzine 25, pregabalin 200 3 times daily, Lamictal, Cymbalta 60 daily, Effexor 150 and Latuda 40 at bedtime Needs outpatient psychiatry follow-up and de-escalation of meds  Rheumatoid arthritis  on Plaquenil 200 twice daily-hold Plaquenil given poor healing etc. resume in 1 to 2 weeks once de-escalated off of antibiotics and  follow-up with rheumatologist  HTN Continue Coreg 6.25 twice daily, amlodipine 10 daily   poorly controlled diabetes mellitus with neuropathy gastroparesis  continue Lyrica continue Lyrica as above 200 3 times daily CBGs ranging 1 70-2 40, continue 15 units of Levemir Needs outpatient A1c and compliance with the same  Prior TIA with tPA in the past Resume Plavix a.m. 11/9 and statin on discharge   DVT prophylaxis: Heparin  Status is: Inpatient Remains inpatient appropriate because:   Requires de-escalation of antibiotics      Subjective: Awake coherent no distress looks well feels well just back from procedure For no chills   Objective + exam Vitals:   01/19/23 1540 01/19/23 1545 01/19/23 1550 01/19/23 1554  BP: 108/74 128/77 130/72 120/70  Pulse: 76 76 75 76  Resp: 20  15 18   Temp:      TempSrc:      SpO2: 100% 100% 100% 99%  Weight:      Height:       Filed Weights   01/18/23 1153 01/18/23 1804  Weight: 83.9 kg 76.2 kg    Examination:  EOMI NCAT no focal deficit no icterus no pallor Chest is clear no wheeze Abdomen is soft he has a JP drain in place with reinforce dressing No lower extremity edema  Data Reviewed: reviewed   CBC    Component Value Date/Time   WBC 5.7 01/19/2023 1053   RBC 4.35 01/19/2023 1053   HGB 11.6 (L) 01/19/2023 1053   HGB 16.5 11/22/2016 1130   HCT 35.5 (L) 01/19/2023 1053   HCT 49.7 11/22/2016 1130   PLT 305 01/19/2023 1053   PLT 249 11/22/2016 1130   MCV 81.6 01/19/2023 1053   MCV 90 11/22/2016 1130  MCH 26.7 01/19/2023 1053   MCHC 32.7 01/19/2023 1053   RDW 12.4 01/19/2023 1053   RDW 13.6 11/22/2016 1130   LYMPHSABS 2.6 01/18/2023 1258   LYMPHSABS 2.0 11/22/2016 1130   MONOABS 0.7 01/18/2023 1258   EOSABS 0.1 01/18/2023 1258   EOSABS 0.1 11/22/2016 1130   BASOSABS 0.1 01/18/2023 1258   BASOSABS 0.0 11/22/2016 1130      Latest Ref Rng & Units 01/19/2023    6:33 AM 01/18/2023   12:58 PM 09/30/2022    4:02 AM   CMP  Glucose 70 - 99 mg/dL 161  096  045   BUN 6 - 20 mg/dL 11  12  21    Creatinine 0.61 - 1.24 mg/dL 4.09  8.11  9.14   Sodium 135 - 145 mmol/L 136  138  137   Potassium 3.5 - 5.1 mmol/L 3.3  3.5  3.6   Chloride 98 - 111 mmol/L 105  103  103   CO2 22 - 32 mmol/L 22  26  24    Calcium 8.9 - 10.3 mg/dL 8.5  9.6  8.9   Total Protein 6.5 - 8.1 g/dL 5.6  7.2    Total Bilirubin <1.2 mg/dL 0.4  0.4    Alkaline Phos 38 - 126 U/L 82  114    AST 15 - 41 U/L 11  14    ALT 0 - 44 U/L 11  13       Scheduled Meds:  amLODipine  10 mg Oral Daily   heparin  5,000 Units Subcutaneous Q8H   insulin aspart  3 Units Subcutaneous Q4H   insulin glargine-yfgn  15 Units Subcutaneous BID   lidocaine-EPINEPHrine  20 mL Intradermal Once   pantoprazole  40 mg Oral Daily   pregabalin  200 mg Oral TID   sodium chloride flush  3 mL Intravenous Q12H   sodium chloride flush  5 mL Intracatheter Q8H   venlafaxine XR  150 mg Oral Q breakfast   Continuous Infusions:  sodium chloride 40 mL/hr at 01/18/23 1726   cefTRIAXone (ROCEPHIN)  IV     vancomycin      Time  33  Rhetta Mura, MD  [facility]

## 2023-01-19 NOTE — Progress Notes (Signed)
Subjective: C/o pain around SPT site, controlled. Afebrile. SPT draining clear yellow urine.  Objective: Vital signs in last 24 hours: Temp:  [97 F (36.1 C)-98.7 F (37.1 C)] 98.4 F (36.9 C) (11/08 0623) Pulse Rate:  [77-84] 77 (11/08 0623) Resp:  [16-20] 16 (11/08 0623) BP: (123-150)/(72-96) 124/72 (11/08 0623) SpO2:  [98 %-100 %] 99 % (11/08 0623) Weight:  [76.2 kg-83.9 kg] 76.2 kg (11/07 1804)  Intake/Output from previous day: 11/07 0701 - 11/08 0700 In: -  Out: 300 [Urine:300] Intake/Output this shift: No intake/output data recorded. UOP: clear yellow  Physical Exam:  General: Alert and oriented CV: RRR Lungs: Clear Abdomen: Soft, ND, tender around SPT site, mild erythema, unable to express any drainage around SPT  Ext: NT, No erythema  Lab Results: Recent Labs    01/18/23 1258  HGB 13.2  HCT 38.5*   BMET Recent Labs    01/18/23 1258  NA 138  K 3.5  CL 103  CO2 26  GLUCOSE 180*  BUN 12  CREATININE 1.07  CALCIUM 9.6     Studies/Results: CT ABDOMEN PELVIS W CONTRAST  Result Date: 01/18/2023 CLINICAL DATA:  Status post suprapubic bladder catheter. Abdominal pain EXAM: CT ABDOMEN AND PELVIS WITH CONTRAST TECHNIQUE: Multidetector CT imaging of the abdomen and pelvis was performed using the standard protocol following bolus administration of intravenous contrast. RADIATION DOSE REDUCTION: This exam was performed according to the departmental dose-optimization program which includes automated exposure control, adjustment of the mA and/or kV according to patient size and/or use of iterative reconstruction technique. CONTRAST:  OMNIPAQUE IOHEXOL 300 MG/ML  SOLN COMPARISON:  Procedure CT 12/26/2022.  Standard CT 06/21/2022. FINDINGS: Lower chest: There is some linear opacity lung bases likely scar or atelectasis. No pleural effusion. Slight breathing motion at the lung bases. Coronary artery calcifications are seen. Please correlate for other coronary  risk factors. Hepatobiliary: No focal liver abnormality is seen. Status post cholecystectomy. No biliary dilatation. Patent portal vein. Pancreas: Global atrophy of the pancreas.  No obvious mass. Spleen: Normal in size without focal abnormality. Adrenals/Urinary Tract: Adrenal glands are preserved. Small lower pole right-sided benign-appearing Bosniak 2 cyst. No specific follow-up. The ureters have normal course and caliber down to the bladder. Suprapubic catheter in place. The bladder is underdistended. There is significant bladder wall thickening there is some air in the lumen. Along the course of the catheter is a small fluid collection with the air. The collection likely has wall enhancement. This measures 6.7 by 3.7 cm. Please correlate for any evidence of a urine leak. Please correlate for abscess formation. This does track superiorly along the course of the rectus abdominis muscle itself as seen on sagittal series 9, image 79. Stomach/Bowel: Stomach is within normal limits. Appendix appears normal. No evidence of bowel wall thickening, distention, or inflammatory changes. Vascular/Lymphatic: Scattered vascular calcifications. Normal caliber aorta and IVC. There are some prominent retroperitoneal nodes seen at the level of the celiac axis. These are are more prominent than usually seen but not pathologic by size criteria. These could be reactive. Reproductive: Prostate is unremarkable. Other: No free air or free fluid. Musculoskeletal: Scattered degenerative changes along the spine and pelvis. There is some sclerosis of the S1 vertebral level, unchanged from prior CT scan. IMPRESSION: Suprapubic catheter in place. Along the course of the catheter along the pelvic wall is a presumed rim enhancing fluid and gas collection measuring up to 6.7 x 3.7 cm. This tracks along the muscle belly of the rectus  abdominis muscle in this location superiorly. Please correlate for any evidence of leak along the catheter versus  infection or abscess or both. No additional fluid collections identified. No bowel obstruction free air. Fatty liver infiltration. Electronically Signed   By: Karen Kays M.D.   On: 01/18/2023 16:30    Assessment/Plan: Suprapubic abscess: S/p CT guided SPT 12/26/2022 with subsequent infection. CT A/P 11/7 with rim enhancing fluid and gas collection along course of catheter. Atonic bladder secondary to diabetic neuropathy. Unable to CIC given severe neuropathy. Intermediate risk non-muscle invasive bladder cancer with no recurrence since 2022  Hx of uncontrolled diabetes, current smoker, peripheral neuropathy, PAD, CAD  -Continue broad spectrum antibiotics -F/u abscess culture -Appreciate IR assistance for abscess drain. -Following   LOS: 1 day   Matt R. Arlis Everly MD 01/19/2023, 7:18 AM [facility]  Pager: [phone]

## 2023-01-19 NOTE — Plan of Care (Signed)

## 2023-01-19 NOTE — Consult Note (Signed)
Chief Complaint: Patient was seen in consultation today for  Chief Complaint  Patient presents with   Abscess   Referring Physician(s): Dr. Mahala Menghini  Supervising Physician: Malachy Moan  Patient Status: [facility]  History of Present Illness: Vincent [name] is a 57 y.o. male with a medical history significant for anxiety, COPD, Bipolar 2 disorder, PTSD, seizures, CAD, chronic pain syndrome, DM2 and bladder cancer with areflexic bladder. The patient is s/p suprapubic catheter placement in IR 12/26/22. Over the past few weeks the patient noted increasing suprapubic discomfort and mild drainage around the site. He was evaluated in the office by Urology and was prescribed an antibiotic for suspected superficial wound infection.   He presented to the ED 01/18/23 with worsening symptoms and CT imaging showed a significant fluid collection around the suprapubic track.  CT abdomen/pelvis 01/18/23 IMPRESSION: 1. Suprapubic catheter in place. Along the course of the catheter along the pelvic wall is a presumed rim enhancing fluid and gas collection measuring up to 6.7 x 3.7 cm. This tracks along the muscle belly of the rectus abdominis muscle in this location superiorly. Please correlate for any evidence of leak along the catheter versus infection or abscess or both. No additional fluid collections identified. 2. No bowel obstruction free air. 3. Fatty liver infiltration.  Interventional Radiology has been asked to evaluate this patient for an image-guided pelvic wall fluid collection aspiration with possible drain placement. Imaging reviewed and procedure approved by Dr. Archer Asa.   Past Medical History:  Diagnosis Date   Anxiety    Benign essential tremor    Bipolar 2 disorder [facility])    followed by [name]--- dr s. Lolly Mustache   Bladder cancer [employer])    recurrent   CAD (coronary artery disease)    cardiac cath 2003  and 2011 both showed normal coronary arteries w/ preserved lvf;  Non  obstructive on CTA Oct 2019.    Chronic pain syndrome    back---- [facility] [place]   Cold extremities    BLE   COPD (chronic obstructive pulmonary disease) (HCC)    DDD (degenerative disc disease), lumbar    Diabetic peripheral neuropathy (HCC)    Gastroparesis    followed by dr Marina Goodell   GERD (gastroesophageal reflux disease)    Hiatal hernia    History of bladder cancer urologist-  previously dr Ronal Fear;  now dr gay   papillay TCC (Ta G1)  s/p TURBT and chemo instillation 2014   History of chest pain 12/2017   heart cath normal   History of encephalopathy 05/27/2015   admission w/ acute encephalopathy thought to be secondary to pain meds and COPD   History of gastric ulcer    History of Helicobacter pylori infection    History of kidney stones    History of TIA (transient ischemic attack) 2008  and 10-19-2018    no residual's   History of traumatic head injury 2010   w/ LOC  per pt needed stitches, hit in head with a mower blade   Hyperlipidemia    Hypertension    Hypogonadism male    s/p  bilateral orchiectomy   Hypothyroidism    Insomnia    Mild obstructive sleep apnea    study in epic 12-04-2016, no cpap   PTSD (post-traumatic stress disorder)    chronic   PTSD (post-traumatic stress disorder)    RA (rheumatoid arthritis) (HCC)    followed by [facility]   Seizures, transient (HCC)  neurologist-  dr Terrace Arabia--  differential dx complex partial seizure .vs.  mood disorder .vs.  pseudoseizure--  negative EEG's   confusion episodes and staring spells since 11/ 2015   (03-26-2020 per pt wife last seizure 10 /2021)   Suicide attempt by drug overdose (HCC) 07/28/2021   Transient confusion NEUOROLOGIST-  DR Terrace Arabia   Episodes since 11/ 2015--  neurologist dx  differential complex partial seizure  .vs. mood disorder . vs. pseudoseizure   Type 2 diabetes mellitus treated with insulin [name])    endocrinologist--- dr Everardo All---  (03-26-2020 pt does not check  blood sugar at home)    Past Surgical History:  Procedure Laterality Date   AMPUTATION Left 04/28/2020   Procedure: LEFT LITTLE FINGER AMPUTATION;  Surgeon: Nadara Mustard, MD;  [name];  Service: Orthopedics;  Laterality: Left;   CARDIAC CATHETERIZATION  12-27-2001  DR Jacinto Halim  &  05-26-2009  DR Eldridge Dace   RESULTS FOR BOTH ARE NORMAL CORONARIES AND PERSERVED LVF/ EF 60%   CARPAL TUNNEL RELEASE Bilateral right 09-16-2003;  left ?   CARPAL TUNNEL RELEASE Left 02/25/2015   Procedure: LEFT CARPAL TUNNEL RELEASE;  Surgeon: Betha Loa, MD;  Location: [name];  Service: Orthopedics;  Laterality: Left;   CYSTOSCOPY N/A 10/10/2012   Procedure: CYSTOSCOPY CLOT EVACUATION FULGERATION OF BLEEDERS ;  Surgeon: Garnett Farm, MD;  [name];  Service: Urology;  Laterality: N/A;   CYSTOSCOPY WITH BIOPSY N/A 11/26/2015   Procedure: CYSTOSCOPY WITH BIOPSY AND FULGURATION;  Surgeon: Ihor Gully, MD;  [name];  Service: Urology;  Laterality: N/A;   ESOPHAGOGASTRODUODENOSCOPY  2014   IRRIGATION AND DEBRIDEMENT ABSCESS Right 09/27/2022   Procedure: IRRIGATION AND DEBRIDEMENT OF RIGHT BUTTOCK  ABSCESS;  Surgeon: Griselda Miner, MD;  [name];  Service: General;  Laterality: Right;   LAPAROSCOPIC CHOLECYSTECTOMY  11-17-2010   ORCHIECTOMY Right 02/21/2016   Procedure: SCROTAL ORCHIECTOMY with TESTICULAR PROSTHESIS IMPLANT;  Surgeon: Ihor Gully, MD;  [name];  Service: Urology;  Laterality: Right;   ORCHIECTOMY Left 09/02/2018   Procedure: ORCHIECTOMY;  Surgeon: Ihor Gully, MD;  Location: [facility];  Service: Urology;  Laterality: Left;   ROTATOR CUFF REPAIR Right 12/2004   TRANSURETHRAL RESECTION OF BLADDER TUMOR N/A 08/09/2012   Procedure: TRANSURETHRAL RESECTION OF BLADDER TUMOR (TURBT) WITH GYRUS WITH MITOMYCIN C;  Surgeon: Garnett Farm, MD;  Location: [facility];  Service: Urology;  Laterality: N/A;   TRANSURETHRAL RESECTION OF BLADDER TUMOR N/A 03/29/2020   Procedure: TRANSURETHRAL RESECTION OF BLADDER TUMOR (TURBT) and post-op instillation of gemcitabine;  Surgeon: [name], MD;  Location[name];  Service: Urology;  Laterality: N/A;   TRANSURETHRAL RESECTION OF BLADDER TUMOR WITH GYRUS (TURBT-GYRUS) N/A 02/27/2014   Procedure: TRANSURETHRAL RESECTION OF BLADDER TUMOR WITH GYRUS (TURBT-GYRUS);  Surgeon: Garnett Farm, MD;  Location[name];  Service: Urology;  Laterality: N/A;    Allergies: Celecoxib, Hydrocodone, and Sulfa antibiotics  Medications: Prior to Admission medications   Medication Sig Start Date End Date Taking? Authorizing Provider  amLODipine (NORVASC) 10 MG tablet Take 1 tablet (10 mg total) by mouth daily. 10/01/22  Yes Leroy Sea, MD  cefdinir (OMNICEF) 300 MG capsule Take 300 mg by mouth 2 (two) times daily. 01/04/23 01/19/23 Yes [provider]  Cholecalciferol (VITAMIN D3) 1000 units CAPS Take 1,000 Units by mouth in the morning.   Yes  [provider]  clopidogrel (PLAVIX) 75 MG tablet Take 75 mg by mouth at bedtime.   Yes [provider]  HUMALOG KWIKPEN 200 UNIT/ML KwikPen Inject 20 Units into the skin in the morning, at noon, and at bedtime. Patient taking differently: Inject 35 Units into the skin 3 (three) times daily after meals. 08/29/22  Yes Sheikh, Omair Latif, DO  hydrOXYzine (ATARAX) 25 MG tablet Take 25 mg by mouth at bedtime. 08/01/22  Yes [provider]  insulin glargine, 2 Unit Dial, (TOUJEO MAX SOLOSTAR) 300 UNIT/ML Solostar Pen Inject 40 Units into the skin 2 (two) times daily at 8 am and 10 pm. Patient taking differently: Inject 35-50 Units into the skin See admin instructions. Inject 35-50 units into the skin after every meal eaten 08/29/22  Yes Sheikh, Omair Latif, DO  omeprazole (PRILOSEC) 40 MG capsule Take 40 mg by  mouth daily before breakfast. 08/23/21  Yes [provider]  pregabalin (LYRICA) 200 MG capsule Take 200 mg by mouth 3 (three) times daily. 12/25/21  Yes Dois Davenport, MD  rOPINIRole (REQUIP) 0.5 MG tablet Take 0.5 mg by mouth at bedtime. 07/25/22  Yes [provider]  rosuvastatin (CRESTOR) 40 MG tablet Take 40 mg by mouth at bedtime.   Yes [provider]  venlafaxine XR (EFFEXOR-XR) 150 MG 24 hr capsule Take 150 mg by mouth daily with breakfast.   Yes [provider]  acetaminophen (TYLENOL) 325 MG tablet Take 2 tablets (650 mg total) by mouth every 6 (six) hours as needed for mild pain (or Fever >/= 101). Patient not taking: Reported on 01/18/2023 08/29/22   Marguerita Merles Latif, DO  carvedilol (COREG) 6.25 MG tablet Take 1 tablet (6.25 mg total) by mouth 2 (two) times daily with a meal. Patient not taking: Reported on 01/18/2023 09/30/22   Leroy Sea, MD  ezetimibe (ZETIA) 10 MG tablet Take 1 tablet (10 mg total) by mouth daily. Patient not taking: Reported on 01/18/2023 10/01/22   Leroy Sea, MD  linezolid (ZYVOX) 600 MG tablet Take 1 tablet (600 mg total) by mouth 2 (two) times daily. Patient not taking: Reported on 01/18/2023 09/30/22   Leroy Sea, MD  nicotine (NICODERM CQ - DOSED IN MG/24 HOURS) 21 mg/24hr patch Place 1 patch (21 mg total) onto the skin daily. Patient not taking: Reported on 01/18/2023 08/30/22   Marguerita Merles Latif, DO  nystatin (MYCOSTATIN/NYSTOP) powder Apply 1 Application topically 3 (three) times daily. Patient not taking: Reported on 01/18/2023 10/29/22   Melene Plan, DO  oxyCODONE (OXY IR/ROXICODONE) 5 MG immediate release tablet Take 1 tablet (5 mg total) by mouth every 12 (twelve) hours as needed for severe pain. Patient not taking: Reported on 01/18/2023 09/30/22   Leroy Sea, MD  rosuvastatin (CRESTOR) 20 MG tablet Take 1 tablet (20 mg total) by mouth at bedtime. 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   Left-handed.   3-4 cups caffeine per day.   Social Determinants of Health   Financial Resource Strain: Not on file  Food Insecurity: No Food Insecurity (01/18/2023)   Hunger Vital Sign    Worried About Running Out of Food in the Last Year: Never true    Ran Out of Food in the Last Year: Never true  Transportation Needs: No Transportation Needs (01/18/2023)   PRAPARE - Administrator, Civil Service (Medical): No    Lack of Transportation (Non-Medical): No  Physical Activity: Not on file  Stress: Not on file  Social Connections: Unknown (07/17/2021)   Received from [name]   Social Network    Social Network: Not on file    Review of Systems: A 12 point ROS discussed and pertinent positives are indicated in the HPI above.  All other systems are negative.  Review of  Systems  Constitutional:  Negative for appetite change and fatigue.  Respiratory:  Negative for cough and shortness of breath.   Cardiovascular:  Negative for chest pain and leg swelling.  Gastrointestinal:  Positive for abdominal pain and nausea. Negative for diarrhea, rectal pain and vomiting.  Neurological:  Negative for dizziness and headaches.    Vital Signs: BP 124/72 (BP Location: Right Arm)   Pulse 77   Temp 98.4 F (36.9 C) (Oral)   Resp 16   Ht 6' (1.829 m)   Wt 167 lb 15.9 oz (76.2 kg)   SpO2 99%   BMI 22.78 kg/m   Physical Exam Constitutional:      General: He is not in acute distress.    Appearance: He is not ill-appearing.  HENT:     Mouth/Throat:     Mouth: Mucous membranes are moist.     Pharynx: Oropharynx is clear.  Cardiovascular:     Rate and Rhythm: Normal rate.     Pulses: Normal pulses.  Pulmonary:     Effort: Pulmonary effort is normal.  Abdominal:     Tenderness: There is abdominal tenderness.     Comments: Generalized abdominal discomfort. Mild/minimal tenderness to palpation around SPT site.   Genitourinary:    Comments: SPT. Urine in gravity bag is hazy amber color. Purulent drainage around SPT insertion site.  Skin:    General: Skin is warm and dry.  Neurological:     Mental Status: He is alert and oriented to person, place, and time.  Psychiatric:        Mood and Affect: Mood normal.        Behavior: Behavior normal.        Thought Content: Thought content normal.  Judgment: Judgment normal.     Imaging: CT ABDOMEN PELVIS W CONTRAST  Result Date: 01/18/2023 CLINICAL DATA:  Status post suprapubic bladder catheter. Abdominal pain EXAM: CT ABDOMEN AND PELVIS WITH CONTRAST TECHNIQUE: Multidetector CT imaging of the abdomen and pelvis was performed using the standard protocol following bolus administration of intravenous contrast. RADIATION DOSE REDUCTION: This exam was performed according to the departmental dose-optimization  program which includes automated exposure control, adjustment of the mA and/or kV according to patient size and/or use of iterative reconstruction technique. CONTRAST:  OMNIPAQUE IOHEXOL 300 MG/ML  SOLN COMPARISON:  Procedure CT 12/26/2022.  Standard CT 06/21/2022. FINDINGS: Lower chest: There is some linear opacity lung bases likely scar or atelectasis. No pleural effusion. Slight breathing motion at the lung bases. Coronary artery calcifications are seen. Please correlate for other coronary risk factors. Hepatobiliary: No focal liver abnormality is seen. Status post cholecystectomy. No biliary dilatation. Patent portal vein. Pancreas: Global atrophy of the pancreas.  No obvious mass. Spleen: Normal in size without focal abnormality. Adrenals/Urinary Tract: Adrenal glands are preserved. Small lower pole right-sided benign-appearing Bosniak 2 cyst. No specific follow-up. The ureters have normal course and caliber down to the bladder. Suprapubic catheter in place. The bladder is underdistended. There is significant bladder wall thickening there is some air in the lumen. Along the course of the catheter is a small fluid collection with the air. The collection likely has wall enhancement. This measures 6.7 by 3.7 cm. Please correlate for any evidence of a urine leak. Please correlate for abscess formation. This does track superiorly along the course of the rectus abdominis muscle itself as seen on sagittal series 9, image 79. Stomach/Bowel: Stomach is within normal limits. Appendix appears normal. No evidence of bowel wall thickening, distention, or inflammatory changes. Vascular/Lymphatic: Scattered vascular calcifications. Normal caliber aorta and IVC. There are some prominent retroperitoneal nodes seen at the level of the celiac axis. These are are more prominent than usually seen but not pathologic by size criteria. These could be reactive. Reproductive: Prostate is unremarkable. Other: No free air or free  fluid. Musculoskeletal: Scattered degenerative changes along the spine and pelvis. There is some sclerosis of the S1 vertebral level, unchanged from prior CT scan. IMPRESSION: Suprapubic catheter in place. Along the course of the catheter along the pelvic wall is a presumed rim enhancing fluid and gas collection measuring up to 6.7 x 3.7 cm. This tracks along the muscle belly of the rectus abdominis muscle in this location superiorly. Please correlate for any evidence of leak along the catheter versus infection or abscess or both. No additional fluid collections identified. No bowel obstruction free air. Fatty liver infiltration. Electronically Signed   By: Karen Kays M.D.   On: 01/18/2023 16:30   CT GUIDED SUPERPUBIC CATHETER PLMT  Result Date: 12/26/2022 INDICATION: 57 year old male referred for suprapubic catheter placement, atonic bladder EXAM: CT-GUIDED SUPRAPUBIC CATHETER PLACEMENT COMPARISON:  CT 06/21/2022 MEDICATIONS: None ANESTHESIA/SEDATION: Moderate (conscious) sedation was employed during this procedure. A total of Versed 3.0 mg and Fentanyl 150 mcg was administered intravenously by the radiology nurse. Total intra-service moderate Sedation Time: 14 minutes. The patient's level of consciousness and vital signs were monitored continuously by radiology nursing throughout the procedure under my direct supervision. CONTRAST:  None FLUOROSCOPY: CT COMPLICATIONS: None PROCEDURE: Informed written consent was obtained from the patient and the patient's family after a discussion of the risks, benefits and alternatives to treatment. Questions regarding the procedure were encouraged and answered. The patient and the  patient's family understand and consents to the procedure. Patient position in the supine position on the CT gantry table. Scout CT acquired for planning purposes. The suprapubic region was prepped with chlorhexidine in a sterile fashion, and a sterile drape was applied covering the operative  field. A sterile gown and sterile gloves were used for the procedure. Local anesthesia was provided with 1% Lidocaine. Indwelling Foley catheter had been clamped prior to the procedure. The skin and subcutaneous tissues were then generously infiltrated with 1% lidocaine to the level of the anterior wall of the bladder for local anesthesia. A small stab incision was made in the skin, and a Yueh needle/catheter was into the urinary bladder. Using modified Seldinger technique, a 14 French pigtail catheter was placed into the urinary bladder with the pigtail catheter locked. Spontaneous urine drained through the tube confirming position. Catheter was sutured in position. Final CT images were acquired. Patient tolerated the procedure well and remained hemodynamically stable throughout. No complications were encountered and no significant blood loss was encountered. IMPRESSION: Status post CT-guided, 14 French suprapubic catheter placement. Signed, Yvone Neu. Reyne Dumas, ABVM, RPVI [name] PLAN: In 4-6 weeks the patient can return for upsized to balloon retention catheter Electronically Signed   By: Gilmer Mor D.O.   On: 12/26/2022 13:07    Labs:  CBC: Recent Labs    12/26/22 0931 01/18/23 1258 01/19/23 0633 01/19/23 1053  WBC 8.7 8.2 5.7 5.7  HGB 13.1 13.2 11.6* 11.6*  HCT 39.3 38.5* 35.0* 35.5*  PLT 238 408* 321 305    COAGS: Recent Labs    02/24/22 1826 08/25/22 2137 09/24/22 1555 12/26/22 0931  INR 1.0 1.0 0.9 0.9  APTT  --  30 30  --     BMP: Recent Labs    09/29/22 0055 09/30/22 0402 01/18/23 1258 01/19/23 0633  NA 136 137 138 136  K 3.9 3.6 3.5 3.3*  CL 103 103 103 105  CO2 24 24 26 22   GLUCOSE 224* 188* 180* 170*  BUN 24* 21* 12 11  CALCIUM 8.7* 8.9 9.6 8.5*  CREATININE 1.35* 1.01 1.07 0.78  GFRNONAA >60 >60 >60 >60    LIVER FUNCTION TESTS: Recent Labs    09/25/22 0202 09/26/22 0225 01/18/23 1258  01/19/23 0633  BILITOT 0.7 0.6 0.4 0.4  AST 17 15 14* 11*  ALT 17 18 13 11   ALKPHOS 65 75 114 82  PROT 5.0* 5.3* 7.2 5.6*  ALBUMIN 2.7* 2.7* 3.4* 2.7*    TUMOR MARKERS: No results for input(s): "AFPTM", "CEA", "CA199", "CHROMGRNA" in the last 8760 hours.  Assessment and Plan:  Bladder cancer with areflexic bladder s/p suprapubic catheter placement 12/26/22; pelvic wall fluid collection adjacent to SPT: Kevan Rosebush, 57 year old male, is tentatively scheduled today for an image-guided pelvic wall fluid collection aspiration with possible drain placement. The procedure was discussed with the patient at the bedside. The patient last ate around 10 am. He was given the option to proceed today with minimal sedation or to delay procedure until tomorrow when he could receive moderate sedation after being NPO for the required length of time. Patient elected to proceed today with minimal sedation.   Risks and benefits discussed with the patient including bleeding, infection, damage to adjacent structures, bowel perforation/fistula connection, and sepsis.  All of the patient's questions were answered, patient is agreeable to proceed. Last dose of subcutaneous heparin was today at 0615.    Consent signed and in IR.  Thank you for this interesting consult.  I greatly enjoyed meeting Vincent [name] and look forward to participating in their care.  A copy of this report was sent to the requesting provider on this date.  Electronically Signed: Alwyn Ren, AGACNP-BC [phone] 01/19/2023, 11:07 AM   I spent a total of 20 Minutes    in face to face in clinical consultation, greater than 50% of which was counseling/coordinating care for pelvic wall fluid collection aspiration/drain placement.

## 2023-01-19 NOTE — Progress Notes (Signed)
Pharmacy Antibiotic Note  Vincent [name] is a 57 y.o. male admitted on 01/18/2023 with  abscess around suprapubic catheter .  Pharmacy has been consulted for Vancomycin dosing.  Plan: Vancomycin 1gm IV q12h to target AUC 400-550.  Estimated AUC on this regimen =483. Monitor renal function and cx data   Height: 6' (182.9 cm) Weight: 76.2 kg (167 lb 15.9 oz) IBW/kg (Calculated) : 77.6  Temp (24hrs), Avg:97.7 F (36.5 C), Min:97 F (36.1 C), Max:98.7 F (37.1 C)  Recent Labs  Lab 01/18/23 1258  WBC 8.2  CREATININE 1.07    Estimated Creatinine Clearance: 84.1 mL/min (by C-G formula based on SCr of 1.07 mg/dL).    Allergies  Allergen Reactions   Celecoxib Anaphylaxis, Swelling, Rash and Other (See Comments)    Tongue swells   Hydrocodone Rash and Other (See Comments)    "Blisters developed on arms"; tolerated Norco okay (per sister)    Sulfa Antibiotics Rash    Antimicrobials this admission: 11/7 Ceftriaxone >>  11/7 Vancomycin >>   Dose adjustments this admission:  Microbiology results: 11/7 Wound Cx (suprapubic area, superficial): GPC  Thank you for allowing pharmacy to be a part of this patient's care.  Junita Push PharmD 01/19/2023 2:11 AM

## 2023-01-20 DIAGNOSIS — Z87898 Personal history of other specified conditions: Secondary | ICD-10-CM | POA: Diagnosis not present

## 2023-01-20 LAB — CBC WITH DIFFERENTIAL/PLATELET
Abs Immature Granulocytes: 0.06 10*3/uL (ref 0.00–0.07)
Basophils Absolute: 0.1 10*3/uL (ref 0.0–0.1)
Basophils Relative: 1 %
Eosinophils Absolute: 0.1 10*3/uL (ref 0.0–0.5)
Eosinophils Relative: 2 %
HCT: 32.5 % — ABNORMAL LOW (ref 39.0–52.0)
Hemoglobin: 11.1 g/dL — ABNORMAL LOW (ref 13.0–17.0)
Immature Granulocytes: 1 %
Lymphocytes Relative: 33 %
Lymphs Abs: 2.4 10*3/uL (ref 0.7–4.0)
MCH: 27.6 pg (ref 26.0–34.0)
MCHC: 34.2 g/dL (ref 30.0–36.0)
MCV: 80.8 fL (ref 80.0–100.0)
Monocytes Absolute: 0.6 10*3/uL (ref 0.1–1.0)
Monocytes Relative: 9 %
Neutro Abs: 3.9 10*3/uL (ref 1.7–7.7)
Neutrophils Relative %: 54 %
Platelets: 311 10*3/uL (ref 150–400)
RBC: 4.02 MIL/uL — ABNORMAL LOW (ref 4.22–5.81)
RDW: 12.6 % (ref 11.5–15.5)
WBC: 7.1 10*3/uL (ref 4.0–10.5)
nRBC: 0 % (ref 0.0–0.2)

## 2023-01-20 LAB — BASIC METABOLIC PANEL
Anion gap: 9 (ref 5–15)
BUN: 14 mg/dL (ref 6–20)
CO2: 22 mmol/L (ref 22–32)
Calcium: 8.5 mg/dL — ABNORMAL LOW (ref 8.9–10.3)
Chloride: 106 mmol/L (ref 98–111)
Creatinine, Ser: 0.8 mg/dL (ref 0.61–1.24)
GFR, Estimated: >60 mL/min (ref >60)
Glucose, Bld: 87 mg/dL (ref 70–99)
Potassium: 3.7 mmol/L (ref 3.5–5.1)
Sodium: 137 mmol/L (ref 135–145)

## 2023-01-20 LAB — GLUCOSE, CAPILLARY
Glucose-Capillary: 84 mg/dL (ref 70–99)
Glucose-Capillary: 117 mg/dL — ABNORMAL HIGH (ref 70–99)
Glucose-Capillary: 124 mg/dL — ABNORMAL HIGH (ref 70–99)

## 2023-01-20 MED ORDER — INSULIN ASPART 100 UNIT/ML IJ SOLN
3.0000 [IU] | Freq: Three times a day (TID) | INTRAMUSCULAR | Status: DC
  Administered 2023-01-20 – 2023-01-23 (×9): 3 [IU] via SUBCUTANEOUS

## 2023-01-20 MED ORDER — CARVEDILOL 6.25 MG PO TABS
6.2500 mg | ORAL_TABLET | Freq: Two times a day (BID) | ORAL | Status: DC
  Administered 2023-01-20 – 2023-01-24 (×8): 6.25 mg via ORAL
  Filled 2023-01-20 (×8): qty 1

## 2023-01-20 MED ORDER — TRAMADOL HCL 50 MG PO TABS
50.0000 mg | ORAL_TABLET | Freq: Once | ORAL | Status: AC
  Administered 2023-01-20: 50 mg via ORAL
  Filled 2023-01-20: qty 1

## 2023-01-20 MED ORDER — CLOPIDOGREL BISULFATE 75 MG PO TABS
75.0000 mg | ORAL_TABLET | Freq: Every day | ORAL | Status: DC
  Administered 2023-01-20 – 2023-01-24 (×5): 75 mg via ORAL
  Filled 2023-01-20 (×5): qty 1

## 2023-01-20 MED ORDER — ALUM & MAG HYDROXIDE-SIMETH 200-200-20 MG/5ML PO SUSP
30.0000 mL | Freq: Four times a day (QID) | ORAL | Status: DC | PRN
  Filled 2023-01-20: qty 30

## 2023-01-20 MED ORDER — SODIUM CHLORIDE 0.9 % IV SOLN
100.0000 mg | INTRAVENOUS | Status: DC
  Administered 2023-01-20 – 2023-01-22 (×3): 100 mg via INTRAVENOUS
  Filled 2023-01-20 (×3): qty 5

## 2023-01-20 MED ORDER — INSULIN GLARGINE-YFGN 100 UNIT/ML ~~LOC~~ SOLN
12.0000 [IU] | Freq: Two times a day (BID) | SUBCUTANEOUS | Status: DC
  Administered 2023-01-20 – 2023-01-25 (×10): 12 [IU] via SUBCUTANEOUS
  Filled 2023-01-20 (×11): qty 0.12

## 2023-01-20 NOTE — Plan of Care (Signed)
  Problem: Education: Goal: Knowledge of General Education information will improve Description: Including pain rating scale, medication(s)/side effects and non-pharmacologic comfort measures Outcome: Progressing   Problem: Health Behavior/Discharge Planning: Goal: Ability to manage health-related needs will improve Outcome: Progressing   Problem: Clinical Measurements: Goal: Ability to maintain clinical measurements within normal limits will improve Outcome: Progressing Goal: Will remain free from infection Outcome: Progressing Goal: Diagnostic test results will improve Outcome: Progressing Goal: Respiratory complications will improve Outcome: Progressing Goal: Cardiovascular complication will be avoided Outcome: Progressing   Problem: Activity: Goal: Risk for activity intolerance will decrease Outcome: Progressing   Problem: Nutrition: Goal: Adequate nutrition will be maintained Outcome: Progressing   Problem: Coping: Goal: Level of anxiety will decrease Outcome: Progressing   Problem: Elimination: Goal: Will not experience complications related to bowel motility Outcome: Progressing Goal: Will not experience complications related to urinary retention Outcome: Progressing   Problem: Pain Management: Goal: General experience of comfort will improve Outcome: Progressing   Problem: Safety: Goal: Ability to remain free from injury will improve Outcome: Progressing   Problem: Skin Integrity: Goal: Risk for impaired skin integrity will decrease Outcome: Progressing   Problem: Education: Goal: Ability to describe self-care measures that may prevent or decrease complications (Diabetes Survival Skills Education) will improve Outcome: Progressing Goal: Individualized Educational Video(s) Outcome: Progressing   Problem: Coping: Goal: Ability to adjust to condition or change in health will improve Outcome: Progressing   Problem: Fluid Volume: Goal: Ability to  maintain a balanced intake and output will improve Outcome: Progressing   Problem: Metabolic: Goal: Ability to maintain appropriate glucose levels will improve Outcome: Progressing   Problem: Nutritional: Goal: Maintenance of adequate nutrition will improve Outcome: Progressing Goal: Progress toward achieving an optimal weight will improve Outcome: Progressing   Problem: Skin Integrity: Goal: Risk for impaired skin integrity will decrease Outcome: Progressing   Problem: Tissue Perfusion: Goal: Adequacy of tissue perfusion will improve Outcome: Progressing

## 2023-01-20 NOTE — Progress Notes (Signed)
HOSPITALIST ROUNDING NOTE Vincent [name] [id number]:[id number]  DOB: [birth year]  DOA: 01/18/2023  PCP: Dois Davenport, MD  01/20/2023,2:04 PM   LOS: 2 days      Code Status: Full From: Home  current Dispo: Home     57 wm known poorly controlled diabetes complicated gastroparesis neuropathy-has been diabetic since his early 53s Anxiety disorder Bipolar 2 with prior suicidal attempt Chronic pain syndrome Reflux Prior stroke status post tPA 05/2020--he has been admitted several times for focal neurological deficits with hyperglycemia additionally-at last visit he was placed on Plavix alone COPD Previous seizures on Lamictal Previous bladder cancer Benign essential tremor   Recent right gluteal cleft abscess status post incision and drainage-underwent general surgery evaluation 09/27/2022 and was discharged on 09/30/2022   Brought in from home pain discharge around suprapubic cath that was placed on 10/15 [had been on cefdinir ]by IR endorsing 6/10 pain with CBGs >500 WBC 8.2 hemoglobin 13 platelet 408 BUN/creatinine 12/1.0, sodium 138   General Surgery, urology consulted   CT abdomen pelvis returned showing 6.7 X3.7 rim-enhancing fluid gas collection along the belly of the rectus abdominis superiorly surrounding the suprapubic catheter 01/19/2023 IR consulted US guided placement of 12 French drain into rectus abscess performed  Plan  Suprapubic catheter abscess failed outpatient management 12 French drain placed-follow cultures to completion-continue vancomycin ceftriaxone, adding Diflucan as yeast growing as well De-escalate as able  Bipolar with suicidality resume hydroxyzine 25, pregabalin 200 3 times daily, Lamictal, Cymbalta 60 daily, Effexor 150 and Latuda 40 at bedtime Needs outpatient psychiatry follow-up and de-escalation of meds-patient is aware  Rheumatoid arthritis  on Plaquenil 200 twice daily-hold Plaquenil given poor healing etc. resume in 1 to 2 weeks once de-escalated off of  antibiotics   HTN Continue Coreg 6.25 twice daily, amlodipine 10 daily Moderately controlled   poorly controlled diabetes mellitus with neuropathy gastroparesis Continue Lyrica as above 200 3 times daily CBG ranging 80s to 120s, risk for hypoglycemia is probably not eating as much as he usually does-cut back Lantus to 12 units twice daily, continue sliding scale sensitive  Needs outpatient A1c and compliance with the same  Prior TIA with tPA in the past Plavix resumed monitor   DVT prophylaxis: Heparin  Status is: Inpatient Remains inpatient appropriate because:   Requires de-escalation of antibiotics      Subjective:  Doing fair has not been out of bed since back No shortness of breath no chest pain Eating drinking okay   Objective + exam Vitals:   01/19/23 1554 01/19/23 2114 01/20/23 0536 01/20/23 1312  BP: 120/70 123/68 (!) 143/87 114/67  Pulse: 76 81 83 80  Resp: 18 16 16 18   Temp:  99.2 F (37.3 C) 98.2 F (36.8 C) 98.1 F (36.7 C)  TempSrc:  Oral Oral Oral  SpO2: 99% 98% 100% 99%  Weight:      Height:       Filed Weights   01/18/23 1153 01/18/23 1804  Weight: 83.9 kg 76.2 kg    Examination:  EOMI NCAT no focal deficit no icterus no pallor Chest is clear  Abdomen soft no rebound no guarding, reinforce drain in place ROM intact moving 4 limbs equally Neuro intact  Data Reviewed: reviewed   CBC    Component Value Date/Time   WBC 7.1 01/20/2023 0908   RBC 4.02 (L) 01/20/2023 0908   HGB 11.1 (L) 01/20/2023 0908   HGB 16.5 11/22/2016 1130   HCT 32.5 (L) 01/20/2023 0908   HCT  49.7 11/22/2016 1130   PLT 311 01/20/2023 0908   PLT 249 11/22/2016 1130   MCV 80.8 01/20/2023 0908   MCV 90 11/22/2016 1130   MCH 27.6 01/20/2023 0908   MCHC 34.2 01/20/2023 0908   RDW 12.6 01/20/2023 0908   RDW 13.6 11/22/2016 1130   LYMPHSABS 2.4 01/20/2023 0908   LYMPHSABS 2.0 11/22/2016 1130   MONOABS 0.6 01/20/2023 0908   EOSABS 0.1 01/20/2023 0908   EOSABS  0.1 11/22/2016 1130   BASOSABS 0.1 01/20/2023 0908   BASOSABS 0.0 11/22/2016 1130      Latest Ref Rng & Units 01/20/2023    9:08 AM 01/19/2023    6:33 AM 01/18/2023   12:58 PM  CMP  Glucose 70 - 99 mg/dL 87  616  073   BUN 6 - 20 mg/dL 14  11  12    Creatinine 0.61 - 1.24 mg/dL 7.10  6.26  9.48   Sodium 135 - 145 mmol/L 137  136  138   Potassium 3.5 - 5.1 mmol/L 3.7  3.3  3.5   Chloride 98 - 111 mmol/L 106  105  103   CO2 22 - 32 mmol/L 22  22  26    Calcium 8.9 - 10.3 mg/dL 8.5  8.5  9.6   Total Protein 6.5 - 8.1 g/dL  5.6  7.2   Total Bilirubin <1.2 mg/dL  0.4  0.4   Alkaline Phos 38 - 126 U/L  82  114   AST 15 - 41 U/L  11  14   ALT 0 - 44 U/L  11  13      Scheduled Meds:  amLODipine  10 mg Oral Daily   heparin  5,000 Units Subcutaneous Q8H   insulin aspart  3 Units Subcutaneous TID WC   insulin glargine-yfgn  15 Units Subcutaneous BID   lidocaine-EPINEPHrine  20 mL Intradermal Once   pantoprazole  40 mg Oral Daily   pregabalin  200 mg Oral TID   sodium chloride flush  3 mL Intravenous Q12H   sodium chloride flush  5 mL Intracatheter Q8H   venlafaxine XR  150 mg Oral Q breakfast   Continuous Infusions:  cefTRIAXone (ROCEPHIN)  IV 2 g (01/19/23 1626)   vancomycin 1,250 mg (01/20/23 0446)    Time  33  Rhetta Mura, MD  [facility]

## 2023-01-20 NOTE — Progress Notes (Signed)
RN and CNA assisted patient with mobility, 1 assist to Dangle EOB, stood at bedside with walker for a few, patient asked to get back in bed, d/t dizziness. Bed set to chair position.

## 2023-01-20 NOTE — Progress Notes (Signed)
Subjective: C/o pain around SPT site which is well controlled.  SPT draining clear yellow urine. Culture growing gram positive cocci and yeast  Objective: Vital signs in last 24 hours: Temp:  [98.2 F (36.8 C)-99.2 F (37.3 C)] 98.2 F (36.8 C) (11/09 0536) Pulse Rate:  [75-83] 83 (11/09 0536) Resp:  [15-20] 16 (11/09 0536) BP: (108-143)/(68-87) 143/87 (11/09 0536) SpO2:  [98 %-100 %] 100 % (11/09 0536)  Intake/Output from previous day: 11/08 0701 - 11/09 0700 In: 20 [I.V.:20] Out: 445 [Urine:400; Drains:5] Intake/Output this shift: Total I/O In: 120 [P.O.:120] Out: -  UOP: clear yellow  Physical Exam:  General: Alert and oriented CV: RRR Lungs: Clear Abdomen: Soft, ND, tender around SPT site, mild erythema, unable to express any drainage around SPT  Ext: NT, No erythema  Lab Results: Recent Labs    01/19/23 0633 01/19/23 1053 01/20/23 0908  HGB 11.6* 11.6* 11.1*  HCT 35.0* 35.5* 32.5*   BMET Recent Labs    01/19/23 0633 01/20/23 0908  NA 136 137  K 3.3* 3.7  CL 105 106  CO2 22 22  GLUCOSE 170* 87  BUN 11 14  CREATININE 0.78 0.80  CALCIUM 8.5* 8.5*     Studies/Results: IR US ABSCESS DRAIN PLACEMENT  Result Date: 01/19/2023 INDICATION: 57 year old male with rectus sheath abscess surrounding the tract of the suprapubic catheter tubing. He presents for drain placement. EXAM: Ultrasound-guided placement of abscess drain MEDICATIONS: The patient is currently admitted to the hospital and receiving intravenous antibiotics. The antibiotics were administered within an appropriate time frame prior to the initiation of the procedure. ANESTHESIA/SEDATION: Fentanyl 50 mcg IV; Versed 1 mg IV administered by the radiology nurse Moderate Sedation Time:  10 minutes The patient's vital signs and level of consciousness were continuously monitored during the procedure by the interventional radiology nurse under my direct supervision. COMPLICATIONS: None immediate.  PROCEDURE: Informed written consent was obtained from the patient after a thorough discussion of the procedural risks, benefits and alternatives. All questions were addressed. Maximal Sterile Barrier Technique was utilized including caps, mask, sterile gowns, sterile gloves, sterile drape, hand hygiene and skin antiseptic. A timeout was performed prior to the initiation of the procedure. The anterior abdominal wall was interrogated with ultrasound. An irregular fluid collection is successfully identified. A suitable skin entry site was identified and local anesthesia was attained by infiltration with 1% lidocaine. A small dermatotomy was made. Under real-time ultrasound guidance, an 18 gauge trocar needle was carefully advanced into the complex fluid collection within the rectus abdominus musculature. A 0.035 wire was then coiled in the collection. The needle was removed. The tract was dilated to 45 [place]. A 12 French skater drainage catheter was then advanced over the wire and formed. Aspiration yields approximately 7 mL of purulent bloody fluid. This was sent for Gram stain and culture. The catheter was then flushed and connected to JP bulb suction before being secured to the skin with 0 Prolene suture. The patient tolerated the procedure well. IMPRESSION: Successful placement of a 12 French drainage catheter into the rectus abdominus abscess cavity. Electronically Signed   By: Malachy Moan M.D.   On: 01/19/2023 16:10   CT ABDOMEN PELVIS W CONTRAST  Result Date: 01/18/2023 CLINICAL DATA:  Status post suprapubic bladder catheter. Abdominal pain EXAM: CT ABDOMEN AND PELVIS WITH CONTRAST TECHNIQUE: Multidetector CT imaging of the abdomen and pelvis was performed using the standard protocol following bolus administration of intravenous contrast. RADIATION DOSE REDUCTION: This exam was performed according  to the departmental dose-optimization program which includes automated exposure control, adjustment of  the mA and/or kV according to patient size and/or use of iterative reconstruction technique. CONTRAST:  OMNIPAQUE IOHEXOL 300 MG/ML  SOLN COMPARISON:  Procedure CT 12/26/2022.  Standard CT 06/21/2022. FINDINGS: Lower chest: There is some linear opacity lung bases likely scar or atelectasis. No pleural effusion. Slight breathing motion at the lung bases. Coronary artery calcifications are seen. Please correlate for other coronary risk factors. Hepatobiliary: No focal liver abnormality is seen. Status post cholecystectomy. No biliary dilatation. Patent portal vein. Pancreas: Global atrophy of the pancreas.  No obvious mass. Spleen: Normal in size without focal abnormality. Adrenals/Urinary Tract: Adrenal glands are preserved. Small lower pole right-sided benign-appearing Bosniak 2 cyst. No specific follow-up. The ureters have normal course and caliber down to the bladder. Suprapubic catheter in place. The bladder is underdistended. There is significant bladder wall thickening there is some air in the lumen. Along the course of the catheter is a small fluid collection with the air. The collection likely has wall enhancement. This measures 6.7 by 3.7 cm. Please correlate for any evidence of a urine leak. Please correlate for abscess formation. This does track superiorly along the course of the rectus abdominis muscle itself as seen on sagittal series 9, image 79. Stomach/Bowel: Stomach is within normal limits. Appendix appears normal. No evidence of bowel wall thickening, distention, or inflammatory changes. Vascular/Lymphatic: Scattered vascular calcifications. Normal caliber aorta and IVC. There are some prominent retroperitoneal nodes seen at the level of the celiac axis. These are are more prominent than usually seen but not pathologic by size criteria. These could be reactive. Reproductive: Prostate is unremarkable. Other: No free air or free fluid. Musculoskeletal: Scattered degenerative changes along the  spine and pelvis. There is some sclerosis of the S1 vertebral level, unchanged from prior CT scan. IMPRESSION: Suprapubic catheter in place. Along the course of the catheter along the pelvic wall is a presumed rim enhancing fluid and gas collection measuring up to 6.7 x 3.7 cm. This tracks along the muscle belly of the rectus abdominis muscle in this location superiorly. Please correlate for any evidence of leak along the catheter versus infection or abscess or both. No additional fluid collections identified. No bowel obstruction free air. Fatty liver infiltration. Electronically Signed   By: Karen Kays M.D.   On: 01/18/2023 16:30    Assessment/Plan: Suprapubic abscess: S/p CT guided SPT 12/26/2022 with subsequent infection. CT A/P 11/7 with rim enhancing fluid and gas collection along course of catheter. Atonic bladder secondary to diabetic neuropathy. Unable to CIC given severe neuropathy. Intermediate risk non-muscle invasive bladder cancer with no recurrence since 2022  Hx of uncontrolled diabetes, current smoker, peripheral neuropathy, PAD, CAD  -Continue broad spectrum antibiotics pending urine culture -Appreciate IR assistance for abscess drain.    LOS: 2 days   Wilkie Aye

## 2023-01-20 NOTE — Plan of Care (Signed)
  Problem: Education: Goal: Knowledge of General Education information will improve Description: Including pain rating scale, medication(s)/side effects and non-pharmacologic comfort measures Outcome: Progressing   Problem: Elimination: Goal: Will not experience complications related to bowel motility Outcome: Progressing   Problem: Pain Management: Goal: General experience of comfort will improve Outcome: Progressing   Problem: Safety: Goal: Ability to remain free from injury will improve Outcome: Progressing

## 2023-01-21 DIAGNOSIS — Z87898 Personal history of other specified conditions: Secondary | ICD-10-CM | POA: Diagnosis not present

## 2023-01-21 LAB — AEROBIC CULTURE W GRAM STAIN (SUPERFICIAL SPECIMEN): Gram Stain: NONE SEEN

## 2023-01-21 LAB — GLUCOSE, CAPILLARY
Glucose-Capillary: 284 mg/dL — ABNORMAL HIGH (ref 70–99)
Glucose-Capillary: 221 mg/dL — ABNORMAL HIGH (ref 70–99)

## 2023-01-21 MED ORDER — ONDANSETRON 4 MG PO TBDP
4.0000 mg | ORAL_TABLET | Freq: Three times a day (TID) | ORAL | Status: DC | PRN
  Administered 2023-01-21: 4 mg via ORAL
  Filled 2023-01-21: qty 1

## 2023-01-21 NOTE — Progress Notes (Signed)
HOSPITALIST ROUNDING NOTE Vincent [name] WGN:[id number]  DOB: 06-04-1965  DOA: 01/18/2023  PCP: Dois Davenport, MD  01/21/2023,5:03 PM   LOS: 3 days      Code Status: Full From: Home  current Dispo: Home     57 wm known poorly controlled diabetes complicated gastroparesis neuropathy-has been diabetic since his early 89s Anxiety disorder Bipolar 2 with prior suicidal attempt Chronic pain syndrome Reflux Prior stroke status post tPA 05/2020--he has been admitted several times for focal neurological deficits with hyperglycemia additionally-at last visit he was placed on Plavix alone COPD Previous seizures on Lamictal Previous bladder cancer Benign essential tremor   Recent right gluteal cleft abscess status post incision and drainage-underwent general surgery evaluation 09/27/2022 and was discharged on 09/30/2022   Brought in from home pain discharge around suprapubic cath that was placed on 10/15 [had been on cefdinir ]by IR endorsing 6/10 pain with CBGs >500 WBC 8.2 hemoglobin 13 platelet 408 BUN/creatinine 12/1.0, sodium 138   General Surgery, urology consulted   CT abdomen pelvis returned showing 6.7 X3.7 rim-enhancing fluid gas collection along the belly of the rectus abdominis superiorly surrounding the suprapubic catheter 01/19/2023 IR consulted US guided placement of 12 French drain into rectus abscess performed.  11/10--informed of N/V and also JP drain draining urine, suprapubis not draining urine.  See below  Plan  Suprapubic catheter abscess failed outpatient management--now appears to be blocked and non fucntional 12 French drain --JP now draining frank urine and SP cath not passing urine at all Placed foley per instructions of Dr. Ronne Binning, who feels we can hold off on rpt imaging of Abd /pelvis--d/c Dr. Archer Asa IR--he recommends replacement/upsizing of Foley---I have asked that Primary Urologist Dr. Cardell Peach oversee this management option in the morning and coordinate with  me//IR if there is a change in plan Abx ceftrixone/micafungin/vanc all continue--de-escalate as per culture data  Bipolar with suicidality resume hydroxyzine 25, pregabalin 200 3 times daily, Lamictal, Cymbalta 60 daily, Effexor 150 and Latuda 40 at bedtime Needs psychiatry follow-up and de-escalation of meds-patient is aware  Rheumatoid arthritis  on Plaquenil 200 twice daily-hold Plaquenil given poor healing etc. resume once follow with rheum  HTN Continue Coreg 6.25 twice daily, amlodipine 10 daily Moderately controlled   poorly controlled diabetes mellitus with neuropathy gastroparesis--some hypoglycemia earlier on 11/9--allow temporary higher CBG Continue Lyrica as above 200 3 times daily CBG ranging 221-284, risk for hypoglycemia is probably not eating as much as he usually does-keep Lantus 12 units twice daily, continue sliding scale sensitive  Prior TIA with tPA in the past Plavix resumed monitor  DVT prophylaxis: Heparin  Status is: Inpatient Remains inpatient appropriate because:   Requires de-escalation of antibiotics      Subjective:  See above issues re: Foley concerns--is passing urine in JP--SP drain has been saturated with urine several times today--passing urine now out of penis Some n vomit last pm No cp No fever   Objective + exam Vitals:   01/20/23 1312 01/20/23 2055 01/21/23 0420 01/21/23 1329  BP: 114/67 128/65 138/74 107/66  Pulse: 80 87 92 77  Resp: 18 14 16 16   Temp: 98.1 F (36.7 C) 98.3 F (36.8 C) 98.6 F (37 C) 97.6 F (36.4 C)  TempSrc: Oral Oral Oral Oral  SpO2: 99% 98% 96% 98%  Weight:      Height:       Filed Weights   01/18/23 1153 01/18/23 1804  Weight: 83.9 kg 76.2 kg    Examination:  EOMI NCAT no focal deficit no icterus no pallor Chest is clear  Abdomen slight distension--SP examined no longer having purulent drainge.  JP has no drainage--on deep pressure, no urine leak from either site around Stoma ROM intact  moving 4 limbs equally Neuro intact  Data Reviewed: reviewed   CBC    Component Value Date/Time   WBC 7.1 01/20/2023 0908   RBC 4.02 (L) 01/20/2023 0908   HGB 11.1 (L) 01/20/2023 0908   HGB 16.5 11/22/2016 1130   HCT 32.5 (L) 01/20/2023 0908   HCT 49.7 11/22/2016 1130   PLT 311 01/20/2023 0908   PLT 249 11/22/2016 1130   MCV 80.8 01/20/2023 0908   MCV 90 11/22/2016 1130   MCH 27.6 01/20/2023 0908   MCHC 34.2 01/20/2023 0908   RDW 12.6 01/20/2023 0908   RDW 13.6 11/22/2016 1130   LYMPHSABS 2.4 01/20/2023 0908   LYMPHSABS 2.0 11/22/2016 1130   MONOABS 0.6 01/20/2023 0908   EOSABS 0.1 01/20/2023 0908   EOSABS 0.1 11/22/2016 1130   BASOSABS 0.1 01/20/2023 0908   BASOSABS 0.0 11/22/2016 1130      Latest Ref Rng & Units 01/20/2023    9:08 AM 01/19/2023    6:33 AM 01/18/2023   12:58 PM  CMP  Glucose 70 - 99 mg/dL 87  454  098   BUN 6 - 20 mg/dL 14  11  12    Creatinine 0.61 - 1.24 mg/dL 1.19  1.47  8.29   Sodium 135 - 145 mmol/L 137  136  138   Potassium 3.5 - 5.1 mmol/L 3.7  3.3  3.5   Chloride 98 - 111 mmol/L 106  105  103   CO2 22 - 32 mmol/L 22  22  26    Calcium 8.9 - 10.3 mg/dL 8.5  8.5  9.6   Total Protein 6.5 - 8.1 g/dL  5.6  7.2   Total Bilirubin <1.2 mg/dL  0.4  0.4   Alkaline Phos 38 - 126 U/L  82  114   AST 15 - 41 U/L  11  14   ALT 0 - 44 U/L  11  13      Scheduled Meds:  amLODipine  10 mg Oral Daily   carvedilol  6.25 mg Oral BID WC   clopidogrel  75 mg Oral QHS   heparin  5,000 Units Subcutaneous Q8H   insulin aspart  3 Units Subcutaneous TID WC   insulin glargine-yfgn  12 Units Subcutaneous BID   lidocaine-EPINEPHrine  20 mL Intradermal Once   pantoprazole  40 mg Oral Daily   pregabalin  200 mg Oral TID   sodium chloride flush  3 mL Intravenous Q12H   sodium chloride flush  5 mL Intracatheter Q8H   venlafaxine XR  150 mg Oral Q breakfast   Continuous Infusions:  cefTRIAXone (ROCEPHIN)  IV 2 g (01/21/23 1552)   micafungin (MYCAMINE) 100 mg in  sodium chloride 0.9 % 100 mL IVPB 100 mg (01/21/23 1629)   vancomycin 1,250 mg (01/21/23 5621)    Time  45  Rhetta Mura, MD  [facility]

## 2023-01-21 NOTE — Evaluation (Signed)
Physical Therapy Evaluation Patient Details Name: Vincent [name] MRN: [id number] DOB: 01-03-1966 Today's Date: 01/21/2023  History of Present Illness  Pt is a 57 year old male admitted 01/18/23 for Suprapubic abscess: S/p CT guided SPT 12/26/2022 with subsequent infection. CT A/P 11/7 with rim enhancing fluid and gas collection along course of catheter.  PMHx: CVA, DM, peripheral neuropathy, GAD, chronic pain syndrome, bipolar 2 disorder, PTSD, RA, CAD, COPD, GERD, h/o bladder cancer and gastroparesis.  Clinical Impression  Pt admitted with above diagnosis.  Pt currently with functional limitations due to the deficits listed below (see PT Problem List). Pt will benefit from acute skilled PT to increase their independence and safety with mobility to allow discharge.  Pt reports increased weakness and difficulty with mobilizing prior to admission.  Pt also reports history of peripheral neuropathy and falls.  Pt does not feel able to safely return home in current condition since he does not feel his sister can provide physical assist.  Patient will benefit from continued inpatient follow up therapy, <3 hours/day.         If plan is discharge home, recommend the following: A lot of help with walking and/or transfers;A lot of help with bathing/dressing/bathroom;Assistance with cooking/housework;Help with stairs or ramp for entrance   Can travel by private vehicle        Equipment Recommendations None recommended by PT  Recommendations for Other Services       Functional Status Assessment Patient has had a recent decline in their functional status and demonstrates the ability to make significant improvements in function in a reasonable and predictable amount of time.     Precautions / Restrictions Precautions Precautions: Fall Precaution Comments: suprapubic catheter with leg bag, JP drain      Mobility  Bed Mobility Overal bed mobility: Needs Assistance Bed Mobility: Supine to Sit, Sit  to Supine     Supine to sit: Min assist Sit to supine: Min assist   General bed mobility comments: assist for trunk upright and LE onto bed    Transfers Overall transfer level: Needs assistance Equipment used: Rolling walker (2 wheels) Transfers: Sit to/from Stand Sit to Stand: Mod assist, From elevated surface           General transfer comment: verbal cues for technique, assist to rise, pt utilizes full knee extension and posterior lean against bed to self assist/support; performed twice; pt unable to march in place, wide BOS    Ambulation/Gait                  Stairs            Wheelchair Mobility     Tilt Bed    Modified Rankin (Stroke Patients Only)       Balance Overall balance assessment: History of Falls                                           Pertinent Vitals/Pain Pain Assessment Pain Assessment: Faces Faces Pain Scale: Hurts even more Pain Location: generalized Pain Descriptors / Indicators: Grimacing, Guarding Pain Intervention(s): Repositioned, Monitored during session    Home Living Family/patient expects to be discharged to:: Private residence Living Arrangements: Other relatives (sister) Available Help at Discharge: Family Type of Home: Mobile home Home Access: Stairs to enter Entrance Stairs-Rails: Doctor, general practice of Steps: 5   Home Layout: One level Home Equipment:  Rollator (4 wheels);Cane - single point;Shower seat Additional Comments: sister lives with pt but can't provide physical assist per pt    Prior Function Prior Level of Function : History of Falls (last six months);Independent/Modified Independent             Mobility Comments: mod I with use of rollator at all times, reports significant fall history but that has decreased in recent 2 months per pt       Extremity/Trunk Assessment        Lower Extremity Assessment Lower Extremity Assessment: Generalized  weakness;LLE deficits/detail;RLE deficits/detail RLE Sensation: history of peripheral neuropathy LLE Sensation: history of peripheral neuropathy       Communication   Communication Communication: No apparent difficulties  Cognition Arousal: Alert Behavior During Therapy: WFL for tasks assessed/performed Overall Cognitive Status: Within Functional Limits for tasks assessed                                          General Comments General comments (skin integrity, edema, etc.): Pt reports significant history of peripheral neuropathy and falls.    Exercises     Assessment/Plan    PT Assessment Patient needs continued PT services  PT Problem List Decreased strength;Decreased activity tolerance;Decreased balance;Decreased mobility;Decreased knowledge of use of DME;Decreased coordination;Impaired sensation       PT Treatment Interventions DME instruction;Gait training;Balance training;Functional mobility training;Therapeutic exercise;Therapeutic activities;Patient/family education;Stair training    PT Goals (Current goals can be found in the Care Plan section)  Acute Rehab PT Goals PT Goal Formulation: With patient Time For Goal Achievement: 02/04/23 Potential to Achieve Goals: Good    Frequency Min 1X/week     Co-evaluation               AM-PAC PT "6 Clicks" Mobility  Outcome Measure Help needed turning from your back to your side while in a flat bed without using bedrails?: A Lot Help needed moving from lying on your back to sitting on the side of a flat bed without using bedrails?: A Lot Help needed moving to and from a bed to a chair (including a wheelchair)?: A Lot Help needed standing up from a chair using your arms (e.g., wheelchair or bedside chair)?: A Lot Help needed to walk in hospital room?: Total Help needed climbing 3-5 steps with a railing? : Total 6 Click Score: 10    End of Session Equipment Utilized During Treatment: Gait  belt Activity Tolerance: Patient tolerated treatment well Patient left: with call bell/phone within reach;in bed;with bed alarm set Nurse Communication: Mobility status PT Visit Diagnosis: Difficulty in walking, not elsewhere classified (R26.2);Unsteadiness on feet (R26.81)    Time: 0272-5366 PT Time Calculation (min) (ACUTE ONLY): 12 min   Charges:   PT Evaluation $PT Eval Low Complexity: 1 Low   PT General Charges $$ ACUTE PT VISIT: 1 Visit       Thomasene Mohair PT, DPT Physical Therapist Acute Rehabilitation Services Office: [phone]   Janan Halter [place] 01/21/2023, 12:46 PM

## 2023-01-21 NOTE — Plan of Care (Signed)
  Problem: Education: Goal: Knowledge of General Education information will improve Description: Including pain rating scale, medication(s)/side effects and non-pharmacologic comfort measures Outcome: Progressing   Problem: Clinical Measurements: Goal: Ability to maintain clinical measurements within normal limits will improve Outcome: Progressing Goal: Diagnostic test results will improve Outcome: Progressing   Problem: Coping: Goal: Level of anxiety will decrease Outcome: Progressing   Problem: Elimination: Goal: Will not experience complications related to bowel motility Outcome: Progressing

## 2023-01-22 DIAGNOSIS — Z87898 Personal history of other specified conditions: Secondary | ICD-10-CM | POA: Diagnosis not present

## 2023-01-22 LAB — CBC WITH DIFFERENTIAL/PLATELET
Abs Immature Granulocytes: 0.05 10*3/uL (ref 0.00–0.07)
Basophils Absolute: 0.1 10*3/uL (ref 0.0–0.1)
Basophils Relative: 1 %
Eosinophils Absolute: 0.2 10*3/uL (ref 0.0–0.5)
Eosinophils Relative: 3 %
HCT: 34.6 % — ABNORMAL LOW (ref 39.0–52.0)
Hemoglobin: 11.3 g/dL — ABNORMAL LOW (ref 13.0–17.0)
Immature Granulocytes: 1 %
Lymphocytes Relative: 36 %
Lymphs Abs: 2.5 10*3/uL (ref 0.7–4.0)
MCH: 27.1 pg (ref 26.0–34.0)
MCHC: 32.7 g/dL (ref 30.0–36.0)
MCV: 83 fL (ref 80.0–100.0)
Monocytes Absolute: 0.5 10*3/uL (ref 0.1–1.0)
Monocytes Relative: 7 %
Neutro Abs: 3.6 10*3/uL (ref 1.7–7.7)
Neutrophils Relative %: 52 %
Platelets: 281 10*3/uL (ref 150–400)
RBC: 4.17 MIL/uL — ABNORMAL LOW (ref 4.22–5.81)
RDW: 12.9 % (ref 11.5–15.5)
WBC: 7 10*3/uL (ref 4.0–10.5)
nRBC: 0 % (ref 0.0–0.2)

## 2023-01-22 LAB — GLUCOSE, CAPILLARY: Glucose-Capillary: 128 mg/dL — ABNORMAL HIGH (ref 70–99)

## 2023-01-22 LAB — BASIC METABOLIC PANEL
Anion gap: 10 (ref 5–15)
BUN: 18 mg/dL (ref 6–20)
CO2: 19 mmol/L — ABNORMAL LOW (ref 22–32)
Calcium: 8.6 mg/dL — ABNORMAL LOW (ref 8.9–10.3)
Chloride: 109 mmol/L (ref 98–111)
Creatinine, Ser: 0.7 mg/dL (ref 0.61–1.24)
GFR, Estimated: >60 mL/min (ref >60)
Glucose, Bld: 85 mg/dL (ref 70–99)
Potassium: 3.6 mmol/L (ref 3.5–5.1)
Sodium: 138 mmol/L (ref 135–145)

## 2023-01-22 MED ORDER — CHLORHEXIDINE GLUCONATE CLOTH 2 % EX PADS
6.0000 | MEDICATED_PAD | Freq: Every day | CUTANEOUS | Status: DC
  Administered 2023-01-23 – 2023-01-25 (×3): 6 via TOPICAL

## 2023-01-22 MED ORDER — SODIUM CHLORIDE 0.9 % IV SOLN: 2.0000 g | Freq: Four times a day (QID) | INTRAVENOUS | Status: DC

## 2023-01-22 MED ORDER — SODIUM CHLORIDE 0.9 % IV SOLN
1.0000 g | Freq: Three times a day (TID) | INTRAVENOUS | Status: DC
  Filled 2023-01-22: qty 20

## 2023-01-22 MED ORDER — SODIUM CHLORIDE 0.9 % IV SOLN
500.0000 mg | Freq: Four times a day (QID) | INTRAVENOUS | Status: DC
  Administered 2023-01-22 – 2023-01-23 (×4): 500 mg via INTRAVENOUS
  Filled 2023-01-22 (×9): qty 10

## 2023-01-22 NOTE — TOC Initial Note (Signed)
Transition of Care [facility]) - Initial/Assessment Note    Patient Details  Name: Vincent [name] MRN: [id number] Date of Birth: 10/20/1965  [facility]) CM/SW Contact:    Darleene Cleaver, LCSW Phone Number: 01/22/2023, 12:30 PM  Clinical Narrative:                  CSW spoke with patient to discuss discharge plans with him.  Per patient he lives with his sister, and he is concerned that she may not be able to help take care of him unless he gets some rehab first.  CSW discussed with patient, PT recommendation for SNF placement.  CSW explained to him how insurance would pay for the stay and what to expect at SNF.  CSW also discussed if he feels like he could handle 3-4 hours of therapy a day, if so CIR may be a good fit for him.  CSW discussed the difference between CIR, SNF, and home health for rehab.  Patient states he feels like he could handle the high intensity of therapy for CIR.  CSW sent message to PT department to see what they think.  Patient did give CSW permission to begin bed search for SNF in [facility].  CSW to begin bed search in [facility].  Patient will need insurance authorization prior to going to a SNF.  TOC to continue to follow patient's progress throughout discharge planning.  Expected Discharge Plan: Skilled Nursing Facility Barriers to Discharge: Continued Medical Work up   Patient Goals and CMS Choice Patient states their goals for this hospitalization and ongoing recovery are:: To either go to SNF or CIR depending on what he qualifies for.   Choice offered to / list presented to : Patient [place] ownership interest in [facility].provided to:: Patient    Expected Discharge Plan and Services In-house Referral: Clinical Social Work   Post Acute Care Choice: Skilled Nursing Facility Living arrangements for the past 2 months: Single Family Home                                      Prior Living  Arrangements/Services Living arrangements for the past 2 months: Single Family Home Lives with:: Siblings Patient language and need for interpreter reviewed:: Yes Do you feel safe going back to the place where you live?: No   Patient feels he need rehab either at [facility] or CIR prior to returning back home.  Need for Family Participation in Patient Care: No (Comment) Care giver support system in place?: No (comment)   Criminal Activity/Legal Involvement Pertinent to Current Situation/Hospitalization: No - Comment as needed  Activities of Daily Living   ADL Screening (condition at time of admission) Independently performs ADLs?: No Does the patient have a NEW difficulty with bathing/dressing/toileting/self-feeding that is expected to last >3 days?: No Does the patient have a NEW difficulty with getting in/out of bed, walking, or climbing stairs that is expected to last >3 days?: No Does the patient have a NEW difficulty with communication that is expected to last >3 days?: No Is the patient deaf or have difficulty hearing?: No Does the patient have difficulty seeing, even when wearing glasses/contacts?: No Does the patient have difficulty concentrating, remembering, or making decisions?: No  Permission Sought/Granted Permission sought to share information with : Case Manager, Magazine features editor, Family Supports Permission granted to share information with : Yes, Verbal  Permission Granted, Yes, Release of Information Signed  Share Information with NAME: blansett,renee Sister [phone]  [phone]  Permission granted to share info w AGENCY: SNF admissions        Emotional Assessment Appearance:: Appears stated age Attitude/Demeanor/Rapport: Engaged Affect (typically observed): Accepting, Appropriate, Calm, Stable Orientation: : Oriented to Self, Oriented to Place, Oriented to  Time, Oriented to Situation   Psych Involvement: No (comment)  Admission diagnosis:  Abscess  [L02.91] Hx of abdominal abscess [Z87.898] Patient Active Problem List   Diagnosis Date Noted   Hx of abdominal abscess 01/18/2023   Sepsis with acute renal failure (HCC) 09/25/2022   Abscess, gluteal, right 09/25/2022   Peripheral neuropathy in hands 06/08/2022   Essential hypertension 05/23/2022   BPH (benign prostatic hyperplasia) 05/23/2022   AKI (acute kidney injury) (HCC) 09/04/2021   Hypotension 09/03/2021   History of seizure 08/02/2021   Cerebral thrombosis with cerebral infarction 11/16/2020   Ischemic stroke (HCC) 06/10/2020   Pain due to onychomycosis of toenails of both feet 01/28/2020   Hyperglycemia due to type 2 diabetes mellitus (HCC) 01/07/2020   Resistance to insulin 01/07/2020   Right leg weakness 04/24/2019   Right leg pain 04/24/2019   Gait abnormality 04/24/2019   TIA (transient ischemic attack) 10/20/2018   DM2 (diabetes mellitus, type 2) (HCC) 10/19/2018   Left sided numbness 10/19/2018   Diabetic autonomic neuropathy associated with type 2 diabetes mellitus (HCC) 02/01/2018   DM type 2 with diabetic peripheral neuropathy (HCC) 02/01/2018   Dyslipidemia 01/31/2018   Coronary artery disease involving native coronary artery of native heart without angina pectoris 01/31/2018   Chest pain 01/01/2018   Breakthrough seizure (HCC) 12/19/2016   Essential tremor 12/19/2016   Chronic post-traumatic stress disorder (PTSD) 06/16/2016   Tremor 06/16/2016   Tobacco abuse 05/05/2016   Altered mental status 05/28/2015   Type 2 diabetes mellitus with hyperglycemia (HCC) 05/28/2015   Hypothyroidism 05/28/2015   Bipolar 2 disorder (HCC) 05/28/2015   History of rheumatoid arthritis 05/28/2015   Chronic pain 05/28/2015   Bipolar II disorder, most recent episode major depressive (HCC) 03/18/2015   Carpal tunnel syndrome on left 02/10/2015   Carpal tunnel syndrome on right 02/10/2015   Diabetes (HCC) 01/23/2015   Obesity (BMI 30-39.9) 03/06/2014   Acute encephalopathy  03/05/2014   Hyperlipidemia 03/05/2014   Anemia, normocytic normochromic 03/05/2014   Altered mental state    Helicobacter pylori (H. pylori) infection 11/20/2012   Protein-calorie malnutrition, severe (HCC) 10/09/2012   Dysphagia 08/19/2012   Gastroparesis 08/19/2012   Bladder tumor 08/08/2012   Biliary dyskinesia 11/02/2010   PCP:  Dois Davenport, MD Pharmacy:   [facility] [address] [place], [place] - [address] [place] [address] Phone: [phone] Fax: [phone]     Social Determinants of Health (SDOH) Social History: SDOH Screenings   Food Insecurity: No Food Insecurity (01/18/2023)  Housing: Low Risk  (01/18/2023)  Transportation Needs: No Transportation Needs (01/18/2023)  Utilities: Not At Risk (01/18/2023)  Alcohol Screen: Low Risk  (07/31/2021)  Social Connections: Unknown (07/17/2021)   Received from [facility]  Tobacco Use: High Risk (01/18/2023)   SDOH Interventions:     Readmission Risk Interventions    01/22/2023   11:58 AM 05/26/2022    1:26 PM  Readmission Risk Prevention Plan  Transportation Screening Complete   PCP or Specialist Appt within 3-5 Days Complete Complete  HRI or Home Care Consult Complete Complete  Social Work Librarian, academic for  Recovery Care Planning/Counseling Complete Complete  Palliative Care Screening Not Applicable Not Applicable  Medication Review (RN Care Manager) Referral to Pharmacy Complete

## 2023-01-22 NOTE — Progress Notes (Addendum)
Referring Physician(s): Samtani,J  Supervising Physician: Roanna Banning  Patient Status:  [facility] - In-pt  Chief Complaint: Suprapubic abscess/urinoma/neurogenic bladder   Subjective: Patient currently without major complaints; denies fever, respiratory issues, worsening abdominal/back pain, nausea, vomiting; getting ready to ambulate in hallway   Allergies: Celecoxib, Hydrocodone, and Sulfa antibiotics  Medications: Prior to Admission medications   Medication Sig Start Date End Date Taking? Authorizing Provider  amLODipine (NORVASC) 10 MG tablet Take 1 tablet (10 mg total) by mouth daily. 10/01/22  Yes Leroy Sea, MD  Cholecalciferol (VITAMIN D3) 1000 units CAPS Take 1,000 Units by mouth in the morning.   Yes [provider]  clopidogrel (PLAVIX) 75 MG tablet Take 75 mg by mouth at bedtime.   Yes [provider]  HUMALOG KWIKPEN 200 UNIT/ML KwikPen Inject 20 Units into the skin in the morning, at noon, and at bedtime. Patient taking differently: Inject 35 Units into the skin 3 (three) times daily after meals. 08/29/22  Yes Sheikh, Omair Latif, DO  hydrOXYzine (ATARAX) 25 MG tablet Take 25 mg by mouth at bedtime. 08/01/22  Yes [provider]  insulin glargine, 2 Unit Dial, (TOUJEO MAX SOLOSTAR) 300 UNIT/ML Solostar Pen Inject 40 Units into the skin 2 (two) times daily at 8 am and 10 pm. Patient taking differently: Inject 35-50 Units into the skin See admin instructions. Inject 35-50 units into the skin after every meal eaten 08/29/22  Yes Sheikh, Omair Latif, DO  omeprazole (PRILOSEC) 40 MG capsule Take 40 mg by mouth daily before breakfast. 08/23/21  Yes [provider]  pregabalin (LYRICA) 200 MG capsule Take 200 mg by mouth 3 (three) times daily. 12/25/21  Yes Dois Davenport, MD  rOPINIRole (REQUIP) 0.5 MG tablet Take 0.5 mg by mouth at bedtime. 07/25/22  Yes [provider]  rosuvastatin (CRESTOR) 40 MG tablet Take 40 mg by mouth  at bedtime.   Yes [provider]  venlafaxine XR (EFFEXOR-XR) 150 MG 24 hr capsule Take 150 mg by mouth daily with breakfast.   Yes [provider]  acetaminophen (TYLENOL) 325 MG tablet Take 2 tablets (650 mg total) by mouth every 6 (six) hours as needed for mild pain (or Fever >/= 101). Patient not taking: Reported on 01/18/2023 08/29/22   Marguerita Merles Latif, DO  carvedilol (COREG) 6.25 MG tablet Take 1 tablet (6.25 mg total) by mouth 2 (two) times daily with a meal. Patient not taking: Reported on 01/18/2023 09/30/22   Leroy Sea, MD  ezetimibe (ZETIA) 10 MG tablet Take 1 tablet (10 mg total) by mouth daily. Patient not taking: Reported on 01/18/2023 10/01/22   Leroy Sea, MD  linezolid (ZYVOX) 600 MG tablet Take 1 tablet (600 mg total) by mouth 2 (two) times daily. Patient not taking: Reported on 01/18/2023 09/30/22   Leroy Sea, MD  nicotine (NICODERM CQ - DOSED IN MG/24 HOURS) 21 mg/24hr patch Place 1 patch (21 mg total) onto the skin daily. Patient not taking: Reported on 01/18/2023 08/30/22   Marguerita Merles Latif, DO  nystatin (MYCOSTATIN/NYSTOP) powder Apply 1 Application topically 3 (three) times daily. Patient not taking: Reported on 01/18/2023 10/29/22   Melene Plan, DO  oxyCODONE (OXY IR/ROXICODONE) 5 MG immediate release tablet Take 1 tablet (5 mg total) by mouth every 12 (twelve) hours as needed for severe pain. Patient not taking: Reported on 01/18/2023 09/30/22   Leroy Sea, MD  rosuvastatin (CRESTOR) 20 MG tablet Take 1 tablet (20 mg total) by  mouth at bedtime. Patient not taking: Reported on 01/18/2023 09/30/22   Leroy Sea, MD     Vital Signs: BP 112/68 (BP Location: Left Arm)   Pulse 72   Temp 98 F (36.7 C) (Oral)   Resp 14   Ht 6' (1.829 m)   Wt 167 lb 15.9 oz (76.2 kg)   SpO2 96%   BMI 22.78 kg/m   Physical Exam awake, alert.  Mid abdominal abscess drain catheter, suprapubic catheter in place along with Foley catheter.   Abscess drain flushed with minimal return, suprapubic catheter flushed with return of light yellow urine.  Small amount of turbid cream-colored fluid in abdominal abscess JP bulb  Imaging: IR US ABSCESS DRAIN PLACEMENT  Result Date: 01/19/2023 INDICATION: 57 year old male with rectus sheath abscess surrounding the tract of the suprapubic catheter tubing. He presents for drain placement. EXAM: Ultrasound-guided placement of abscess drain MEDICATIONS: The patient is currently admitted to the hospital and receiving intravenous antibiotics. The antibiotics were administered within an appropriate time frame prior to the initiation of the procedure. ANESTHESIA/SEDATION: Fentanyl 50 mcg IV; Versed 1 mg IV administered by the radiology nurse Moderate Sedation Time:  10 minutes The patient's vital signs and level of consciousness were continuously monitored during the procedure by the interventional radiology nurse under my direct supervision. COMPLICATIONS: None immediate. PROCEDURE: Informed written consent was obtained from the patient after a thorough discussion of the procedural risks, benefits and alternatives. All questions were addressed. Maximal Sterile Barrier Technique was utilized including caps, mask, sterile gowns, sterile gloves, sterile drape, hand hygiene and skin antiseptic. A timeout was performed prior to the initiation of the procedure. The anterior abdominal wall was interrogated with ultrasound. An irregular fluid collection is successfully identified. A suitable skin entry site was identified and local anesthesia was attained by infiltration with 1% lidocaine. A small dermatotomy was made. Under real-time ultrasound guidance, an 18 gauge trocar needle was carefully advanced into the complex fluid collection within the rectus abdominus musculature. A 0.035 wire was then coiled in the collection. The needle was removed. The tract was dilated to 64 [place]. A 12 French skater drainage catheter was  then advanced over the wire and formed. Aspiration yields approximately 7 mL of purulent bloody fluid. This was sent for Gram stain and culture. The catheter was then flushed and connected to JP bulb suction before being secured to the skin with 0 Prolene suture. The patient tolerated the procedure well. IMPRESSION: Successful placement of a 12 French drainage catheter into the rectus abdominus abscess cavity. Electronically Signed   By: Malachy Moan M.D.   On: 01/19/2023 16:10    Labs:  CBC: Recent Labs    01/19/23 0633 01/19/23 1053 01/20/23 0908 01/22/23 0831  WBC 5.7 5.7 7.1 7.0  HGB 11.6* 11.6* 11.1* 11.3*  HCT 35.0* 35.5* 32.5* 34.6*  PLT 321 305 311 281    COAGS: Recent Labs    08/25/22 2137 09/24/22 1555 12/26/22 0931 01/19/23 1053  INR 1.0 0.9 0.9 0.9  APTT 30 30  --   --     BMP: Recent Labs    01/18/23 1258 01/19/23 0633 01/20/23 0908 01/22/23 0831  NA 138 136 137 138  K 3.5 3.3* 3.7 3.6  CL 103 105 106 109  CO2 26 22 22  19*  GLUCOSE 180* 170* 87 85  BUN 12 11 14 18   CALCIUM 9.6 8.5* 8.5* 8.6*  CREATININE 1.07 0.78 0.80 0.70  GFRNONAA >60 >60 >60 >60  LIVER FUNCTION TESTS: Recent Labs    09/25/22 0202 09/26/22 0225 01/18/23 1258 01/19/23 0633  BILITOT 0.7 0.6 0.4 0.4  AST 17 15 14* 11*  ALT 17 18 13 11   ALKPHOS 65 75 114 82  PROT 5.0* 5.3* 7.2 5.6*  ALBUMIN 2.7* 2.7* 3.4* 2.7*    Assessment and Plan: 57 year old male with history of diabetes, bipolar disorder, stroke, bladder cancer, neurogenic bladder, rectus sheath abscess surrounding tract of suprapubic catheter; suprapubic catheter placed on 12/26/2022, rectus sheath abscess drain placed on 11/8; afebrile, WBC normal, abscess cultures growing few E. coli, few Enterococcus, moderate Klebsiella; continue current treatment; will plan for IR follow-up in 2 weeks with abscess drain injection and if no sign of bladder communication possibly pull JP drain, Foley catheter and exchange/upsize  suprapubic tube.  Output by Drain (mL) 01/20/23 0701 - 01/20/23 1900 01/20/23 1901 - 01/21/23 0700 01/21/23 0701 - 01/21/23 1900 01/21/23 1901 - 01/22/23 0700 01/22/23 0701 - 01/22/23 1445  Closed System Drain 2 Right Groin Bulb (JP) 12 Fr. 10  355 5 0     Electronically Signed: D. Jeananne Rama, PA-C 01/22/2023, 2:33 PM   I spent a total of 15 Minutes at the the patient's bedside AND on the patient's hospital floor or unit, greater than 50% of which was counseling/coordinating care for rectus sheath abscess drain, suprapubic catheter    Patient ID: Vincent [name], male   DOB: June 14, 1965, 57 y.o.   MRN: [id number]

## 2023-01-22 NOTE — TOC PASRR Note (Signed)
30 Day PASRR Note   Patient Details  Name: Vincent [name] [facility] Date of Birth: 27-Feb-1966   Transition of Care [facility]) CM/SW Contact:    Darleene Cleaver, LCSW Phone Number: 01/22/2023, 12:47 PM  To Whom It May Concern:  Please be advised that this patient will require a short-term nursing home stay - anticipated 30 days or less for rehabilitation and strengthening.   The plan is for return home.

## 2023-01-22 NOTE — Progress Notes (Addendum)
     Subjective: No acute events overnight.  Patient reports that he is feeling better.  No purulent drainage from chest.  Objective: Vital signs in last 24 hours: Temp:  [97.6 F (36.4 C)-98.2 F (36.8 C)] 98.2 F (36.8 C) (11/11 0435) Pulse Rate:  [73-78] 73 (11/11 0435) Resp:  [14-16] 14 (11/11 0435) BP: (107-114)/(66-72) 108/72 (11/11 0435) SpO2:  [97 %-98 %] 98 % (11/11 0435)  Assessment/Plan: # Suprapubic abscess # Urinoma # Neurogenic bladder  CT-guided SPT 12/26/2022 with subsequent infection.  CT A/P on 11/7 notes rim-enhancing fluid and gas collection along catheter.  J/P drain placed in abscess with interventional radiology on 01/19/2023.  Over weekend noted to have return of purulent fluid at first and then what appeared to be clear urine.  Foley catheter was placed to maximize drainage with consequent drop in JP drain output.  SPT reported to be clogged. Flushed without resistance on rounds today. Possible sediment obstruction that passed? We will not request to have it exchanged at this time.  Foley cath to stay in place for the time being to maximize drainage.  Okay to cap SPT.  In roughly 2 weeks patient will be at the 6-week mark and tract should have properly epithelialized.  Will have him follow-up with interventional radiology then.  Interrogate drain at that time and if no sign of bladder communication, pull JP drain, Foley catheter, and exchange/upsize SPT. Reviewed plan with IR on-call and they are in agreement.   Cystogram in 2-3 weeks with SPT exchange and upsize.   Intake/Output from previous day: 11/10 0701 - 11/11 0700 In: 245 [P.O.:240] Out: 1260 [Urine:900; Drains:360]  Intake/Output this shift: Total I/O In: 3 [I.V.:3] Out: -   Physical Exam:  General: Alert and oriented CV: No cyanosis Lungs: equal chest rise Abdomen: J/P with 5cc outpt last 12 hrs.  Gu: Foley in place draining clear yellow urine.   Lab Results: Recent Labs     01/19/23 1053 01/20/23 0908 01/22/23 0831  HGB 11.6* 11.1* 11.3*  HCT 35.5* 32.5* 34.6*   BMET Recent Labs    01/20/23 0908 01/22/23 0831  NA 137 138  K 3.7 3.6  CL 106 109  CO2 22 19*  GLUCOSE 87 85  BUN 14 18  CREATININE 0.80 0.70  CALCIUM 8.5* 8.6*     Studies/Results: No results found.    LOS: 4 days   Elmon Kirschner, NP [facility] Pager: [phone]  01/22/2023, 10:05 AM   I have seen and examined the patient and agree with the above assessment and plan.  Continue SPT, abscess drain and foley to gravity drainage.  He will follow-up with interventional radiology in a couple of weeks for catheter interrogation and likely upsizing suprapubic tube.  Will plan to keep Foley catheter in place for now for maximal drainage.  Matt R. Mariene Dickerman MD [facility]  Pager: [phone]

## 2023-01-22 NOTE — Progress Notes (Signed)
HOSPITALIST ROUNDING NOTE Vincent [name] JYN:[id number]  DOB: 1966/01/18  DOA: 01/18/2023  PCP: Dois Davenport, MD  01/22/2023,2:10 PM   LOS: 4 days      Code Status: Full From: Home  current Dispo: Home     57 wm known poorly controlled diabetes complicated gastroparesis neuropathy-has been diabetic since his early 51s Anxiety disorder Bipolar 2 with prior suicidal attempt Chronic pain syndrome Reflux Prior stroke status post tPA 05/2020--he has been admitted several times for focal neurological deficits with hyperglycemia additionally-at last visit he was placed on Plavix alone COPD Previous seizures on Lamictal Previous bladder cancer Benign essential tremor   Recent right gluteal cleft abscess status post incision and drainage-underwent general surgery evaluation 09/27/2022 and was discharged on 09/30/2022   Brought in from home pain discharge around suprapubic cath that was placed on 10/15 [had been on cefdinir ]by IR endorsing 6/10 pain with CBGs >500 WBC 8.2 hemoglobin 13 platelet 408 BUN/creatinine 12/1.0, sodium 138   General Surgery, urology consulted   CT abdomen pelvis returned showing 6.7 X3.7 rim-enhancing fluid gas collection along the belly of the rectus abdominis superiorly surrounding the suprapubic catheter 01/19/2023 IR consulted US guided placement of 12 French drain into rectus abscess performed.  11/10--informed of N/V and also JP drain draining urine, suprapubis not draining urine.  See below  Plan  Suprapubic catheter abscess failed outpatient management--now appears to be blocked --- urologist has seen and recommends outpatient follow-up Will keep indwelling penile Foley catheter, keep suprapubic catheter additionally and It until epithelializes , and on 11/25 can have JP drain interrogation, removal, and upsize of suprapubic catheter Continue on imipenem as patient is growing Klebsiella with ESBL and would complete total of 3 days and then discontinue-purulence  has diminished completely and JP drain is draining the abscess Will follow-up in the outpatient setting with Dr. Cardell Peach --Appreciate urology continued input  Bipolar with suicidality resume hydroxyzine 25, pregabalin 200 3 times daily, Lamictal, Cymbalta 60 daily, Effexor 150 and Latuda 40 at bedtime Needs psychiatry follow-up and de-escalation of meds-patient is aware  Rheumatoid arthritis  on Plaquenil 200 twice daily-hold Plaquenil given poor healing etc. resume once follow with rheum  HTN Continue Coreg 6.25 twice daily, amlodipine 10 daily Moderately controlled at this time   poorly controlled diabetes mellitus with neuropathy gastroparesis--some hypoglycemia earlier on 11/9--allow temporary higher CBG Continue Lyrica as above 200 3 times daily CBG r 1 20-2 40 some risk hypoglycemia is probably not eating much,-keep Lantus 12 units twice daily, continue sliding scale sensitive  Prior TIA with tPA in the past Plavix resumed monitor  DVT prophylaxis: Heparin  Status is: Inpatient Remains inpatient appropriate because:   Requires de-escalation of antibiotics      Subjective:  Passing good urine through the Foley catheter No chest pain no fever Eating drinking Still feels weak has not been able to get up out of bed much and has not walked since yesterday No chest pain   Objective + exam Vitals:   01/21/23 1329 01/21/23 2047 01/22/23 0435 01/22/23 1407  BP: 107/66 114/67 108/72 112/68  Pulse: 77 78 73 72  Resp: 16 14 14 14   Temp: 97.6 F (36.4 C) 98.1 F (36.7 C) 98.2 F (36.8 C) 98 F (36.7 C)  TempSrc: Oral Oral Oral Oral  SpO2: 98% 97% 98% 96%  Weight:      Height:       Filed Weights   01/18/23 1153 01/18/23 1804  Weight: 83.9 kg 76.2  kg    Examination:  EOMI NCAT no focal deficit no icterus no pallor Chest is clear  Abdomen slight distension--Foley catheter draining, wounds and ostomy is not examined today JP drain has some purulent output Neuro is  intact no focal deficit S1-S2 no murmur  Data Reviewed: reviewed   CBC    Component Value Date/Time   WBC 7.0 01/22/2023 0831   RBC 4.17 (L) 01/22/2023 0831   HGB 11.3 (L) 01/22/2023 0831   HGB 16.5 11/22/2016 1130   HCT 34.6 (L) 01/22/2023 0831   HCT 49.7 11/22/2016 1130   PLT 281 01/22/2023 0831   PLT 249 11/22/2016 1130   MCV 83.0 01/22/2023 0831   MCV 90 11/22/2016 1130   MCH 27.1 01/22/2023 0831   MCHC 32.7 01/22/2023 0831   RDW 12.9 01/22/2023 0831   RDW 13.6 11/22/2016 1130   LYMPHSABS 2.5 01/22/2023 0831   LYMPHSABS 2.0 11/22/2016 1130   MONOABS 0.5 01/22/2023 0831   EOSABS 0.2 01/22/2023 0831   EOSABS 0.1 11/22/2016 1130   BASOSABS 0.1 01/22/2023 0831   BASOSABS 0.0 11/22/2016 1130      Latest Ref Rng & Units 01/22/2023    8:31 AM 01/20/2023    9:08 AM 01/19/2023    6:33 AM  CMP  Glucose 70 - 99 mg/dL 85  87  259   BUN 6 - 20 mg/dL 18  14  11    Creatinine 0.61 - 1.24 mg/dL 5.63  8.75  6.43   Sodium 135 - 145 mmol/L 138  137  136   Potassium 3.5 - 5.1 mmol/L 3.6  3.7  3.3   Chloride 98 - 111 mmol/L 109  106  105   CO2 22 - 32 mmol/L 19  22  22    Calcium 8.9 - 10.3 mg/dL 8.6  8.5  8.5   Total Protein 6.5 - 8.1 g/dL   5.6   Total Bilirubin <1.2 mg/dL   0.4   Alkaline Phos 38 - 126 U/L   82   AST 15 - 41 U/L   11   ALT 0 - 44 U/L   11      Scheduled Meds:  amLODipine  10 mg Oral Daily   carvedilol  6.25 mg Oral BID WC   clopidogrel  75 mg Oral QHS   heparin  5,000 Units Subcutaneous Q8H   insulin aspart  3 Units Subcutaneous TID WC   insulin glargine-yfgn  12 Units Subcutaneous BID   lidocaine-EPINEPHrine  20 mL Intradermal Once   pantoprazole  40 mg Oral Daily   pregabalin  200 mg Oral TID   sodium chloride flush  3 mL Intravenous Q12H   sodium chloride flush  5 mL Intracatheter Q8H   venlafaxine XR  150 mg Oral Q breakfast   Continuous Infusions:  imipenem-cilastatin 500 mg (01/22/23 1318)   micafungin (MYCAMINE) 100 mg in sodium chloride 0.9 %  100 mL IVPB 100 mg (01/21/23 1629)    Time  45  Rhetta Mura, MD  [facility]

## 2023-01-22 NOTE — [place] FL2 (Signed)
[place] MEDICAID FL2 LEVEL OF CARE FORM     IDENTIFICATION  Patient Name: Vincent [name] [facility] Birthdate: 05-25-1965 Sex: male Admission Date (Current Location): 01/18/2023  [facility] and [place] Number:  Producer, television/film/video and Address:  [facility],  [address] [place], [place] [address]      Provider Number: [id number]  Attending Physician Name and Address:  Rhetta Mura, MD  Relative Name and Phone Number:  blansett,renee Sister [phone]  [phone]    Current Level of Care: Hospital Recommended Level of Care: Skilled Nursing Facility Prior Approval Number:    Date Approved/Denied:   PASRR Number: Pending  Discharge Plan: SNF    Current Diagnoses: Patient Active Problem List   Diagnosis Date Noted   Hx of abdominal abscess 01/18/2023   Sepsis with acute renal failure (HCC) 09/25/2022   Abscess, gluteal, right 09/25/2022   Peripheral neuropathy in hands 06/08/2022   Essential hypertension 05/23/2022   BPH (benign prostatic hyperplasia) 05/23/2022   AKI (acute kidney injury) (HCC) 09/04/2021   Hypotension 09/03/2021   History of seizure 08/02/2021   Cerebral thrombosis with cerebral infarction 11/16/2020   Ischemic stroke (HCC) 06/10/2020   Pain due to onychomycosis of toenails of both feet 01/28/2020   Hyperglycemia due to type 2 diabetes mellitus (HCC) 01/07/2020   Resistance to insulin 01/07/2020   Right leg weakness 04/24/2019   Right leg pain 04/24/2019   Gait abnormality 04/24/2019   TIA (transient ischemic attack) 10/20/2018   DM2 (diabetes mellitus, type 2) (HCC) 10/19/2018   Left sided numbness 10/19/2018   Diabetic autonomic neuropathy associated with type 2 diabetes mellitus (HCC) 02/01/2018   DM type 2 with diabetic peripheral neuropathy (HCC) 02/01/2018   Dyslipidemia 01/31/2018   Coronary artery disease involving native coronary artery of native heart without angina pectoris 01/31/2018   Chest pain 01/01/2018   Breakthrough  seizure (HCC) 12/19/2016   Essential tremor 12/19/2016   Chronic post-traumatic stress disorder (PTSD) 06/16/2016   Tremor 06/16/2016   Tobacco abuse 05/05/2016   Altered mental status 05/28/2015   Type 2 diabetes mellitus with hyperglycemia (HCC) 05/28/2015   Hypothyroidism 05/28/2015   Bipolar 2 disorder (HCC) 05/28/2015   History of rheumatoid arthritis 05/28/2015   Chronic pain 05/28/2015   Bipolar II disorder, most recent episode major depressive (HCC) 03/18/2015   Carpal tunnel syndrome on left 02/10/2015   Carpal tunnel syndrome on right 02/10/2015   Diabetes (HCC) 01/23/2015   Obesity (BMI 30-39.9) 03/06/2014   Acute encephalopathy 03/05/2014   Hyperlipidemia 03/05/2014   Anemia, normocytic normochromic 03/05/2014   Altered mental state    Helicobacter pylori (H. pylori) infection 11/20/2012   Protein-calorie malnutrition, severe (HCC) 10/09/2012   Dysphagia 08/19/2012   Gastroparesis 08/19/2012   Bladder tumor 08/08/2012   Biliary dyskinesia 11/02/2010    Orientation RESPIRATION BLADDER Height & Weight     Self, Time, Situation, Place  Normal Incontinent Weight: 167 lb 15.9 oz (76.2 kg) Height:  6' (182.9 cm)  BEHAVIORAL SYMPTOMS/MOOD NEUROLOGICAL BOWEL NUTRITION STATUS      Continent Diet (Carb Modified diet)  AMBULATORY STATUS COMMUNICATION OF NEEDS Skin   Limited Assist Verbally Normal                       Personal Care Assistance Level of Assistance  Bathing, Feeding, Dressing Bathing Assistance: Limited assistance Feeding assistance: Independent Dressing Assistance: Limited assistance     Functional Limitations Info  Sight, Hearing, Speech Sight Info: Adequate Hearing Info: Adequate  Speech Info: Adequate    SPECIAL CARE FACTORS FREQUENCY  OT (By licensed OT), PT (By licensed PT)     PT Frequency: Minimum 5x a week OT Frequency: Minimum 5x a week            Contractures Contractures Info: Not present    Additional Factors Info   Code Status, Allergies, Insulin Sliding Scale, Psychotropic Code Status Info: Full Code Allergies Info: Celecoxib  Hydrocodone  Sulfa Antibiotics Psychotropic Info: venlafaxine XR (EFFEXOR-XR) 24 hr capsule 150 mg Insulin Sliding Scale Info: insulin aspart (novoLOG) injection 3 Units 3x a day with meals.       Current Medications (01/22/2023):  This is the current hospital active medication list Current Facility-Administered Medications  Medication Dose Route Frequency Provider Last Rate Last Admin   alum & mag hydroxide-simeth (MAALOX/MYLANTA) 200-200-20 MG/5ML suspension 30 mL  30 mL Oral Q6H PRN Rhetta Mura, MD       amLODipine (NORVASC) tablet 10 mg  10 mg Oral Daily Rhetta Mura, MD   10 mg at 01/22/23 0952   carvedilol (COREG) tablet 6.25 mg  6.25 mg Oral BID WC Rhetta Mura, MD   6.25 mg at 01/22/23 9629   clopidogrel (PLAVIX) tablet 75 mg  75 mg Oral QHS Rhetta Mura, MD   75 mg at 01/21/23 2111   heparin injection 5,000 Units  5,000 Units Subcutaneous Q8H Rhetta Mura, MD   5,000 Units at 01/22/23 0541   hydrOXYzine (ATARAX) tablet 25 mg  25 mg Oral QHS PRN Rhetta Mura, MD   25 mg at 01/21/23 2111   imipenem-cilastatin (PRIMAXIN) 500 mg in sodium chloride 0.9 % 100 mL IVPB  500 mg Intravenous Q6H Samtani, Jai-Gurmukh, MD       influenza vac split trivalent PF (FLULAVAL) injection 0.5 mL  0.5 mL Intramuscular Prior to discharge Rhetta Mura, MD       insulin aspart (novoLOG) injection 3 Units  3 Units Subcutaneous TID WC Rhetta Mura, MD   3 Units at 01/22/23 0948   insulin glargine-yfgn (SEMGLEE) injection 12 Units  12 Units Subcutaneous BID Rhetta Mura, MD   12 Units at 01/22/23 0949   lidocaine-EPINEPHrine (XYLOCAINE W/EPI) 1 %-1:100000 (with pres) injection 20 mL  20 mL Intradermal Once Sterling Big, MD       micafungin [facility]) 100 mg in sodium chloride 0.9 % 100 mL IVPB  100 mg Intravenous Q24H  Rhetta Mura, MD 105 mL/hr at 01/21/23 1629 100 mg at 01/21/23 1629   nicotine (NICODERM CQ - dosed in mg/24 hours) patch 21 mg  21 mg Transdermal Daily PRN Rhetta Mura, MD   21 mg at 01/21/23 1625   ondansetron (ZOFRAN-ODT) disintegrating tablet 4 mg  4 mg Oral Q8H PRN Rhetta Mura, MD   4 mg at 01/21/23 1548   pantoprazole (PROTONIX) EC tablet 40 mg  40 mg Oral Daily Rhetta Mura, MD   40 mg at 01/22/23 0952   pregabalin (LYRICA) capsule 200 mg  200 mg Oral TID Rhetta Mura, MD   200 mg at 01/22/23 0952   sodium chloride flush (NS) 0.9 % injection 3 mL  3 mL Intravenous Q12H Rhetta Mura, MD   3 mL at 01/22/23 0954   sodium chloride flush (NS) 0.9 % injection 5 mL  5 mL Intracatheter Q8H Sterling Big, MD   5 mL at 01/22/23 0543   venlafaxine XR (EFFEXOR-XR) 24 hr capsule 150 mg  150 mg Oral Q breakfast Rhetta Mura, MD   150  mg at 01/22/23 2952     Discharge Medications: Please see discharge summary for a list of discharge medications.  Relevant Imaging Results:  Relevant Lab Results:   Additional Information SSN [id number]  Darleene Cleaver, LCSW

## 2023-01-22 NOTE — Progress Notes (Addendum)
Pharmacy Antibiotic Note  Vincent [name] is a 57 y.o. male with nonmuscle invasive bladder cancer and areflexic bladder with suprapubic catheter placed on 12/26/22. He developed drainage around suprapubic area shortly after catheter placement and prescribed cefdinir on 01/04/23 x14 days for superficial wound infection. He presented to the ED on 01/18/2023 with c/o of pain and discharge around the suprapubic catheter. Abd/pelvis CT on 01/18/23 showed findings that were consistent with a suprapubic abscess. He is currently on vancomycin, ceftriaxone, and micafungin for infection.   Today, 01/22/2023: - day #5 abx - afebrile - wbc wnl - scr down 0.70 - Wound cultures from 11/7 are now growing enterococcus faecalis, klebsiella pneumoniae (ESBL). Budding yeast, K pneumo, E coli (ESBL), and E faecalis growing on 11/8 cultures. After discussion with MD and ID pharmacist, will transition from vancomycin and ceftriaxone to imipenem.  Plan: - Stop vancomycin AND ceftriaxone  - Start IV imipenem 500mg  q6h - Continue IV micafungin 100mg  q24h - Monitor renal function, cultures, overall clinical picture daily - F/u DOT and/or transition to PO antibiotics   __________________________________________  Height: 6' (182.9 cm) Weight: 76.2 kg (167 lb 15.9 oz) IBW/kg (Calculated) : 77.6  Temp (24hrs), Avg:98 F (36.7 C), Min:97.6 F (36.4 C), Max:98.2 F (36.8 C)  Recent Labs  Lab 01/18/23 1258 01/19/23 0633 01/19/23 1053 01/20/23 0908  WBC 8.2 5.7 5.7 7.1  CREATININE 1.07 0.78  --  0.80    Estimated Creatinine Clearance: 112.4 mL/min (by C-G formula based on SCr of 0.8 mg/dL).    Allergies  Allergen Reactions   Celecoxib Anaphylaxis, Swelling, Rash and Other (See Comments)    Tongue swells   Hydrocodone Rash and Other (See Comments)    "Blisters developed on arms"; tolerated Norco okay (per sister)    Sulfa Antibiotics Rash    Antibiotics this admission: 11/7 ceftriaxone >> 11/10 11/7  vancomycin >> 11/11 11/9 micafungin >> 11/11 imipenem >>   Microbiology results: 11/7 wound cx: enterococcus faecalis, klebsiella pneumoniae (ESBL) 11/8 wound cx: K pneumo, E faecalis, E coli (ESBL) rare budding yeast     Thank you for allowing pharmacy to be a part of this patient's care.  Cherylin Mylar, PharmD Clinical Pharmacist  11/11/20247:41 AM

## 2023-01-22 NOTE — Progress Notes (Signed)
Mobility Specialist - Progress Note   01/22/23 1451  Mobility  Activity Transferred from bed to chair  Level of Assistance Minimal assist, patient does 75% or more  Assistive Device Front wheel walker  Range of Motion/Exercises Active  Activity Response Tolerated well  Mobility Referral Yes  $Mobility charge 1 Mobility  Mobility Specialist Start Time (ACUTE ONLY) 1429  Mobility Specialist Stop Time (ACUTE ONLY) 1447  Mobility Specialist Time Calculation (min) (ACUTE ONLY) 18 min   Received in bed and agreed to transfer. Pt was Min A for bed mobility, and STS. Upon standing pt became shaky, lacking strength to push through arms into walker. Took small steps sideways towards chair where pt was left with all needs met.  Marilynne Halsted Mobility Specialist

## 2023-01-22 NOTE — TOC Progression Note (Addendum)
Transition of Care [facility]) - Progression Note    Patient Details  Name: Vincent [name] MRN: [id number] Date of Birth: 1965-08-07  [facility] of [facility] CM/SW Contact  Darleene Cleaver, [place] Phone Number: 01/22/2023, 1:23 PM  Clinical Narrative:     Patient is a level 2 [facility] screen.  TOC uploaded requested information to [facility]'s website.  TOC awaiting bed offers, and [facility] number.  4:20pm [facility] number received [phone] [id number], expires 02/21/2023.  Expected Discharge Plan: Skilled Nursing Facility Barriers to Discharge: Continued Medical Work up  Expected Discharge Plan and Services In-house Referral: Clinical Social Work   Post Acute Care Choice: Skilled Nursing Facility Living arrangements for the past 2 months: Single Family Home                                       Social Determinants of Health (SDOH) Interventions SDOH Screenings   Food Insecurity: No Food Insecurity (01/18/2023)  Housing: Low Risk  (01/18/2023)  Transportation Needs: No Transportation Needs (01/18/2023)  Utilities: Not At Risk (01/18/2023)  Alcohol Screen: Low Risk  (07/31/2021)  Social Connections: Unknown (07/17/2021)   Received from [facility]  Tobacco Use: High Risk (01/18/2023)    Readmission Risk Interventions    01/22/2023   11:58 AM 05/26/2022    1:26 PM  Readmission Risk Prevention Plan  Transportation Screening Complete   PCP or Specialist Appt within 3-5 Days Complete Complete  HRI or Home Care Consult Complete Complete  Social Work Consult for Recovery Care Planning/Counseling Complete Complete  Palliative Care Screening Not Applicable Not Applicable  Medication Review Oceanographer) Referral to Pharmacy Complete

## 2023-01-23 DIAGNOSIS — Z87898 Personal history of other specified conditions: Secondary | ICD-10-CM | POA: Diagnosis not present

## 2023-01-23 LAB — GLUCOSE, CAPILLARY
Glucose-Capillary: 111 mg/dL — ABNORMAL HIGH (ref 70–99)
Glucose-Capillary: 146 mg/dL — ABNORMAL HIGH (ref 70–99)
Glucose-Capillary: 99 mg/dL (ref 70–99)
Glucose-Capillary: 193 mg/dL — ABNORMAL HIGH (ref 70–99)

## 2023-01-23 MED ORDER — TOUJEO MAX SOLOSTAR 300 UNIT/ML ~~LOC~~ SOPN: 15.0000 [IU] | PEN_INJECTOR | Freq: Two times a day (BID) | SUBCUTANEOUS | Status: DC

## 2023-01-23 MED ORDER — VENLAFAXINE HCL ER 150 MG PO CP24: 150.0000 mg | ORAL_CAPSULE | Freq: Every day | ORAL | 0 refills | Status: DC

## 2023-01-23 MED ORDER — SODIUM CHLORIDE 0.9 % IV SOLN
1.0000 g | Freq: Once | INTRAVENOUS | Status: AC
  Administered 2023-01-23: 1 g via INTRAVENOUS
  Filled 2023-01-23: qty 1000

## 2023-01-23 MED ORDER — INSULIN ASPART 100 UNIT/ML IJ SOLN: 3.0000 [IU] | Freq: Three times a day (TID) | INTRAMUSCULAR | 11 refills | Status: DC

## 2023-01-23 MED ORDER — INSULIN ASPART 100 UNIT/ML IJ SOLN: 2.0000 [IU] | Freq: Three times a day (TID) | INTRAMUSCULAR | Status: DC

## 2023-01-23 MED ORDER — INSULIN ASPART 100 UNIT/ML IJ SOLN
0.0000 [IU] | Freq: Three times a day (TID) | INTRAMUSCULAR | Status: DC
  Administered 2023-01-24: 2 [IU] via SUBCUTANEOUS

## 2023-01-23 MED ORDER — PREGABALIN 200 MG PO CAPS: 200.0000 mg | ORAL_CAPSULE | Freq: Three times a day (TID) | ORAL | 0 refills | Status: DC

## 2023-01-23 NOTE — Progress Notes (Signed)
Physical Therapy Treatment Patient Details Name: Vincent [name] MRN: [id number] DOB: 1965/10/11 Today's Date: 01/23/2023   History of Present Illness Pt is a 57 year old male admitted 01/18/23 for Suprapubic abscess: S/p CT guided SPT 12/26/2022 with subsequent infection. CT A/P 11/7 with rim enhancing fluid and gas collection along course of catheter.  PMHx: CVA, DM, peripheral neuropathy, GAD, chronic pain syndrome, bipolar 2 disorder, PTSD, RA, CAD, COPD, GERD, h/o bladder cancer and gastroparesis.    PT Comments  Pt agreeable to working with therapy. He remains weak and at risk for falls when mobilizing. Pt c/o dizziness throughout session today, particularly with position changes (sitting, standing). Pt reports dizziness was better/nearly resolved once returning to supine. Encouraged him to try to sit up in bed with HOB elevated throughout the day, as able (pt declined sitting up in recliner due to chair being uncomfortable). Requested nursing assess orthostatic BP later today, if able, to see if his BP is dropping when he mobilizes. Patient will benefit from continued inpatient follow up therapy, <3 hours/day. He has some concerns with getting "stuck in a place" and not being able to go back home. He also has concerns about tolerating 3 hours of therapy a day in [facility]. Feel [facility] SNF may be better option at this time.    If plan is discharge home, recommend the following: A lot of help with walking and/or transfers;A lot of help with bathing/dressing/bathroom;Assistance with cooking/housework;Help with stairs or ramp for entrance   Can travel by private vehicle     No  Equipment Recommendations  None recommended by PT    Recommendations for Other Services       Precautions / Restrictions Precautions Precautions: Fall Precaution Comments: suprapubic catheter with leg bag + foley catheter, (R) JP drain Restrictions Weight Bearing Restrictions: No     Mobility  Bed Mobility Overal bed  mobility: Needs Assistance Bed Mobility: Rolling, Sidelying to Sit Rolling: Supervision Sidelying to sit: Min assist       General bed mobility comments: Assist for trunk to upright position. Increased time. Heavy reliance on bedrail. Weak    Transfers Overall transfer level: Needs assistance Equipment used: Rolling walker (2 wheels) Transfers: Sit to/from Stand Sit to Stand: Mod assist           General transfer comment: Min A from elevated bed. Mod A from [facility]. Cues for safety, technique, hand/feet placement. High fall risk. Pt braces backs of legs against sitting surface to get leverage to rise and to stabilize against. C/O dizziness.    Ambulation/Gait Mod Assist  Gait Distance (Feet): 3 Feet Assistive device: Rolling walker (2 wheels) Gait Pattern/deviations: Decreased stride length       General Gait Details: Assist to stabilize pt while walking short distance from bsc to hob. High fall risk 2* poor balance and pt report of dizziness. Pt requested to return to bed.   Stairs             Wheelchair Mobility     Tilt Bed    Modified Rankin (Stroke Patients Only)       Balance Overall balance assessment: Needs assistance, History of Falls         Standing balance support: Reliant on assistive device for balance, Bilateral upper extremity supported, During functional activity Standing balance-Leahy Scale: Poor  Cognition Arousal: Alert Behavior During Therapy: WFL for tasks assessed/performed Overall Cognitive Status: Within Functional Limits for tasks assessed                                          Exercises General Exercises - Lower Extremity Quad Sets: AROM, Both, 10 reps Long Arc Quad: AROM, Both, 10 reps Hip ABduction/ADduction: AROM, Both, 10 reps    General Comments        Pertinent Vitals/Pain Pain Assessment Pain Assessment: No/denies pain    Home Living                           Prior Function            PT Goals (current goals can now be found in the care plan section) Progress towards PT goals: Progressing toward goals    Frequency    Min 1X/week      PT Plan      Co-evaluation              AM-PAC PT "6 Clicks" Mobility   Outcome Measure  Help needed turning from your back to your side while in a flat bed without using bedrails?: A Little Help needed moving from lying on your back to sitting on the side of a flat bed without using bedrails?: A Little Help needed moving to and from a bed to a chair (including a wheelchair)?: A Little Help needed standing up from a chair using your arms (e.g., wheelchair or bedside chair)?: A Lot Help needed to walk in hospital room?: A Lot Help needed climbing 3-5 steps with a railing? : Total 6 Click Score: 14    End of Session Equipment Utilized During Treatment: Gait belt Activity Tolerance: Patient tolerated treatment well Patient left: in bed;with call bell/phone within reach;with bed alarm set   PT Visit Diagnosis: Difficulty in walking, not elsewhere classified (R26.2);Muscle weakness (generalized) (M62.81);History of falling (Z91.81)     Time: 4098-1191 PT Time Calculation (min) (ACUTE ONLY): 35 min  Charges:    $Gait Training: 8-22 mins $Therapeutic Exercise: 8-22 mins PT General Charges $$ ACUTE PT VISIT: 1 Visit                         Faye Ramsay, PT Acute Rehabilitation  Office: [phone]

## 2023-01-23 NOTE — Discharge Instructions (Signed)
Interventional Radiology Percutaneous Abscess Drain Placement After Care   This sheet gives you information about how to care for yourself after your procedure. Your health care provider may also give you more specific instructions. Your drain was placed by an interventional radiologist with [facility]. If you have questions or concerns, contact [facility] at [phone].   What is a percutaneous drain?   A drain is a small plastic tube (catheter) that goes into the fluid collection in your body through your skin.   How long will I need the drain?   How long the drain needs to stay in is determined by where the drain is, how much comes out of the drain each day and if you are having any other surgical procedures.   Interventional radiology will determine when it is time to remove the drain. It is important to follow up as directed so that the drain can be removed as soon as it is safe to do so.   What can I expect after the procedure?   After the procedure, it is common to have:   A small amount of bruising and discomfort in the area where the drainage tube (catheter) was placed.   Sleepiness and fatigue. This should go away after the medicines you were given have worn off.   Follow these instructions at home:   Insertion site care   Check your insertion site when you change the bandage. Check for:   More redness, swelling, or pain.   More fluid or blood.   Warmth.   Pus or a bad smell.   When caring for your insertion site:   Wash your hands with soap and water for at least 20 seconds before and after you change your bandage (dressing). If soap and water are not available, use hand sanitizer.   You do not need to change your dressing everyday if it is clean and dry. Change your dressing every 3 days or as needed when it is soiled, wet or becoming dislodged. You will need to change your dressing each time you shower.   Leave stitches (sutures), skin  glue, or adhesive strips in place. These skin closures may need to stay in place for 2 weeks or longer. If adhesive strip edges start to loosen and curl up, you may trim the loose edges. Do not remove adhesive strips completely unless your health care provider tells you to do so.    Catheter care   Flush the catheter once per day with 5 mL of 0.9% normal saline unless you are told otherwise by your healthcare provider. This helps to prevent clogs in the catheter.   To disconnect the drain, turn the clear plastic tube to the left. Attach the saline syringe by placing it on the white end of the drain and turning gently to the right. Once attached gently push the plunger to the 5 mL mark. After you are done flushing, disconnect the syringe by turning to the left and reattach your drainage container    If you have a bulb please be sure the bulb is charged after reconnecting it - to do this pinch the bulb between your thumb and first finger and close the stopper located on the top of the bulb.     Check for fluid leaking from around your catheter (instead of fluid draining through your catheter). This may be a sign that the drain is no longer working correctly.   Write down the following information every time  you empty your bag:   The date and time.   The amount of drainage.   Activity   Rest at home for 1-2 days after your procedure.   For the first 48 hours do not lift anything more than 10 lbs (about a gallon of milk). You may perform moderate activities/exercise. Please avoid strenuous activities during this time.   Avoid any activities which may pull on your drain as this can cause your drain to become dislodged.   If you were given a sedative during the procedure, it can affect you for several hours. Do not drive or operate machinery until your health care provider says that it is safe.   General instructions   For mild pain take over-the-counter medications as needed for pain such  as Tylenol or Advil. If you are experiencing severe pain please call our office as this may indicate an issue with your drain.    If you were prescribed an antibiotic medicine, take it as told by your health care provider. Do not stop using the antibiotic even if you start to feel better.   You may shower 24 hours after the drain is placed. To do this cover the insertion site with a water tight material such as saran wrap and seal the edges with tape, you may also purchase waterproof dressings at your local drug store. Shower as usual and then remove the water tight dressing and any gauze/tape underneath it once you have exited the shower and dried off. Allow the area to air dry or pat dry with a clean towel. Once the skin is completely dry place a new gauze dressing. It is important to keep the site dry at all times to prevent infection.   Do not submerge the drain - this means you cannot take baths, swim, use a hot tub, etc. until the drain is removed.    Do not use any products that contain nicotine or tobacco, such as cigarettes, e-cigarettes, and chewing tobacco. If you need help quitting, ask your health care provider.   Keep all follow-up visits as told by your health care provider. This is important.   Contact a health care provider if:   You have less than 10 mL of drainage a day for 2-3 days in a row, or as directed by your health care provider.   You have any of these signs of infection:   More redness, swelling, or pain around your incision area.   More fluid or blood coming from your incision area.   Warmth coming from your incision area.   Pus or a bad smell coming from your incision area.   You have fluid leaking from around your catheter (instead of through your catheter).   You are unable to flush the drain.   You have a fever or chills.   You have pain that does not get better with medicine.   You have not been contacted to schedule a drain follow up appointment  within 10 days of discharge from the hospital.   Please call [facility] at [phone] with any questions or concerns.   Get help right away if:   Your catheter comes out.   You suddenly stop having drainage from your catheter.   You suddenly have blood in the fluid that is draining from your catheter.   You become dizzy or you faint.   You develop a rash.   You have nausea or vomiting.   You have difficulty breathing or  you feel short of breath.   You develop chest pain.   You have problems with your speech or vision.   You have trouble balancing or moving your arms or legs.   Summary   It is common to have a small amount of bruising and discomfort in the area where the drainage tube (catheter) was placed. You may also have minor discomfort with movement while the drain is in place.   Flush the drain once per day with 5 mL of 0.9% normal saline (unless you were told otherwise by your healthcare provider).    Record the amount of drainage from the bag every time you empty it.   Change the dressing every 3 days or earlier if soiled/wet. Keep the skin dry under the dressing.   You may shower with the drain in place. Do not submerge the drain (no baths, swimming, hot tubs, etc.).   Contact [facility] at [phone] if you have more redness, swelling, or pain around your incision area or if you have pain that does not get better with medicine.   This information is not intended to replace advice given to you by your health care provider. Make sure you discuss any questions you have with your health care provider.   Document Revised: 06/02/2021 Document Reviewed: 02/22/2019   Elsevier Patient Education  2023 [employer]     Interventional Radiology Drain Record   Empty your drain at least once per day. You may empty it as often as needed. Use this form to write down the amount of fluid that has collected in the drainage container. Bring this  form with you to your follow-up visits. Please call [facility] at [phone] with any questions or concerns prior to your appointment.   Drain #1 location: ___________________   Date __________ Time __________ Amount __________   Date __________ Time __________ Amount __________   Date __________ Time __________ Amount __________   Date __________ Time __________ Amount __________   Date __________ Time __________ Amount __________   Date __________ Time __________ Amount __________   Date __________ Time __________ Amount __________   Date __________ Time __________ Amount __________   Date __________ Time __________ Amount __________   Date __________ Time __________ Amount __________   Date __________ Time __________ Amount __________   Date __________ Time __________ Amount __________   Date __________ Time __________ Amount __________   Date __________ Time __________ Amount __________

## 2023-01-23 NOTE — TOC Progression Note (Addendum)
Transition of Care [facility]) - Progression Note    Patient Details  Name: Vincent [name] MRN: [id number] Date of Birth: September 11, 1965  Transition of [facility]) CM/SW Contact  Beckie Busing, RN Phone [phone]  01/23/2023, 11:26 AM  Clinical Narrative:    CM at bedside to present bed offers to patient. Patient would like placement at [facility]. Message has been sent to [facility] liaison. CM to initiate insurance auth.    1218 Insurance auth- pending Auth ID [id number]. Per Kia with [facility] there is no bed available today.  1300 [facility] has bed open now, however patients insurance [employer] has not been approved.  1530 Insurance auth still pending   Expected Discharge Plan: Skilled Nursing Facility Barriers to Discharge: Continued Medical Work up  Expected Discharge Plan and Services In-house Referral: Clinical Social Work   Post Acute Care Choice: Skilled Nursing Facility Living arrangements for the past 2 months: Single Family Home Expected Discharge Date: 01/23/23                                     Social Determinants of Health (SDOH) Interventions SDOH Screenings   Food Insecurity: No Food Insecurity (01/18/2023)  Housing: Low Risk  (01/18/2023)  Transportation Needs: No Transportation Needs (01/18/2023)  Utilities: Not At Risk (01/18/2023)  Alcohol Screen: Low Risk  (07/31/2021)  Social Connections: Unknown (07/17/2021)   Received from [facility]  Tobacco Use: High Risk (01/18/2023)    Readmission Risk Interventions    01/22/2023   11:58 AM 05/26/2022    1:26 PM  Readmission Risk Prevention Plan  Transportation Screening Complete   PCP or Specialist Appt within 3-5 Days Complete Complete  HRI or Home Care Consult Complete Complete  Social Work Consult for Recovery Care Planning/Counseling Complete Complete  Palliative Care Screening Not Applicable Not Applicable  Medication Review [employer]) Referral to Pharmacy Complete

## 2023-01-23 NOTE — Discharge Summary (Signed)
Physician Discharge Summary  Vincent [name] QIH:[id number] DOB: 09-18-1965 DOA: 01/18/2023  PCP: Dois Davenport, MD  Admit date: 01/18/2023 Discharge date: 01/23/2023  Time spent: 45 minutes  Recommendations for Outpatient Follow-up:  Needs outpatient drain interrogation as per IR-they are aware, may also need upsizing of suprapubic catheter Chem-7 CBC in about 1 week Outpatient further follow-up with Dr. Neoma Laming urology-CC him, CC also IR attending Will need de-escalation off of polypharmacy with all of his psych meds in the outpatient setting Will require titration upwards of his diabetes regimen-he was not requiring as much insulin as he previously had  Discharge Diagnoses:  MAIN problem for hospitalization   Infected suprapubic catheter without sepsis physiology Poorly controlled diabetes See below  Please see below for itemized issues addressed in HOpsital- refer to other progress notes for clarity if needed  Discharge Condition: Fair  Diet recommendation: Diabetic  Filed Weights   01/18/23 1153 01/18/23 1804  Weight: 83.9 kg 76.2 kg    History of present illness:  57 wm known poorly controlled diabetes complicated gastroparesis neuropathy-has been diabetic since his early 20s Anxiety disorder Bipolar 2 with prior suicidal attempt Chronic pain syndrome Reflux Prior stroke status post tPA 05/2020--he has been admitted several times for focal neurological deficits with hyperglycemia additionally-at last visit he was placed on Plavix alone COPD Previous seizures on Lamictal Previous bladder cancer Benign essential tremor   Recent right gluteal cleft abscess status post incision and drainage-underwent general surgery evaluation 09/27/2022 and was discharged on 09/30/2022   Brought in from home pain discharge around suprapubic cath that was placed on 10/15 [had been on cefdinir ]by IR endorsing 6/10 pain with CBGs >500 WBC 8.2 hemoglobin 13 platelet  408 BUN/creatinine 12/1.0, sodium 138   General Surgery, urology consulted   CT abdomen pelvis returned showing 6.7 X3.7 rim-enhancing fluid gas collection along the belly of the rectus abdominis superiorly surrounding the suprapubic catheter 01/19/2023 IR consulted US guided placement of 12 French drain into rectus abscess performed.   11/10--informed of N/V and also JP drain draining urine, suprapubis not draining urine.  See below    Hospital Course:  Suprapubic catheter abscess failed outpatient management--now appears to be blocked --- urologist has seen and recommends outpatient follow-up keep indwelling penile Foley catheter[placed on 11/10 by nursing] keep suprapubic catheter additionally and It until epithelializes , and on 11/25 can have JP drain interrogation, removal, and upsize of suprapubic catheter--- will CC Dr. Cardell Peach as well as IR attending with regards to same  Klebsiella ESBL on wound culture in addition to E. coli and Enterococcus faecalis Also few budding yeast on the wound culture I discussed the case with Dr. Wendie Agreste has received source control with the definitive drain placed 11/8 into the rectus abscess--he was on vancomycin and ceftriaxone and micafungin from 11/7 through 11/10 The abscess area and suprapubic area is no longer draining any pus, we switched him to meropenem on 11/11 and he will get 1 more dose of Invanz in the hospital on 11/12 and then he can have drain interrogations as above no further antibiotics required as source control has been achieved and he has a draining JP drain that will be flushed according to instructions from IR   Bipolar with suicidality resume hydroxyzine 25, pregabalin 200 3 times daily, Lamictal, Cymbalta 60 daily, Effexor 150 and Latuda 40 at bedtime Needs psychiatry follow-up and de-escalation of meds-patient is aware   Rheumatoid arthritis  on Plaquenil 200 twice daily-hold Plaquenil given poor  healing etc. resume once  follow with rheum   HTN Continue Coreg 6.25 twice daily, amlodipine 10 daily Moderately controlled at this time    poorly controlled diabetes mellitus with neuropathy gastroparesis--some hypoglycemia earlier on 11/9--allow temporary higher CBG Continue Lyrica as above 200 3 times daily CBG's were quite variable during hospital stay but because of risk of hypoglycemia we cut back his Levemir/long-acting insulin as per regimen below and this will need to be increased We have placed him on a very sensitive sliding scale as below and this may need to be titrated back upwards   prior TIA with tPA in the past Plavix resumed, Crestor etc. in the outpatient setting to be resumed and outpatient follow-up with neurology   Discharge Exam: Vitals:   01/22/23 2023 01/23/23 0514  BP: 130/75 108/65  Pulse: 73 76  Resp: 18 18  Temp: 97.8 F (36.6 C) 97.9 F (36.6 C)  SpO2: 96% 96%    Subj on day of d/c   Awake coherent no distress sitting up in bed but with some difficulty  General Exam on discharge  EOMI NCAT no focal deficit verbalizing fair Chest is clear no wheeze S1-S2 no murmur Abdominal suprapubic site evaluated and scant pus and has been capped JP drain on the right side draining purulent material and has been charged and is draining well Foley catheter in place draining urine No lower extremity edema  Discharge Instructions   Discharge Instructions     Diet - low sodium heart healthy   Complete by: As directed    Increase activity slowly   Complete by: As directed    No wound care   Complete by: As directed       Allergies as of 01/23/2023       Reactions   Celecoxib Anaphylaxis, Swelling, Rash, Other (See Comments)   Tongue swells   Hydrocodone Rash, Other (See Comments)   "Blisters developed on arms"; tolerated Norco okay (per sister)   Sulfa Antibiotics Rash        Medication List     STOP taking these medications    acetaminophen 325 MG  tablet Commonly known as: TYLENOL   cefdinir 300 MG capsule Commonly known as: OMNICEF   HumaLOG KwikPen 200 UNIT/ML KwikPen Generic drug: insulin lispro   linezolid 600 MG tablet Commonly known as: ZYVOX   nystatin powder Commonly known as: MYCOSTATIN/NYSTOP   oxyCODONE 5 MG immediate release tablet Commonly known as: Oxy IR/ROXICODONE   rOPINIRole 0.5 MG tablet Commonly known as: REQUIP       TAKE these medications    amLODipine 10 MG tablet Commonly known as: NORVASC Take 1 tablet (10 mg total) by mouth daily.   carvedilol 6.25 MG tablet Commonly known as: COREG Take 1 tablet (6.25 mg total) by mouth 2 (two) times daily with a meal.   clopidogrel 75 MG tablet Commonly known as: PLAVIX Take 75 mg by mouth at bedtime.   ezetimibe 10 MG tablet Commonly known as: ZETIA Take 1 tablet (10 mg total) by mouth daily.   hydrOXYzine 25 MG tablet Commonly known as: ATARAX Take 25 mg by mouth at bedtime.   insulin aspart 100 UNIT/ML injection Commonly known as: novoLOG Inject 3 Units into the skin 3 (three) times daily with meals.   nicotine 21 mg/24hr patch Commonly known as: NICODERM CQ - dosed in mg/24 hours Place 1 patch (21 mg total) onto the skin daily.   omeprazole 40 MG capsule Commonly known as: PRILOSEC  Take 40 mg by mouth daily before breakfast.   pregabalin 200 MG capsule Commonly known as: LYRICA Take 1 capsule (200 mg total) by mouth 3 (three) times daily.   rosuvastatin 40 MG tablet Commonly known as: CRESTOR Take 40 mg by mouth at bedtime. What changed: Another medication with the same name was removed. Continue taking this medication, and follow the directions you see here.   Toujeo Max SoloStar 300 UNIT/ML Solostar Pen Generic drug: insulin glargine (2 Unit Dial) Inject 15 Units into the skin 2 (two) times daily at 8 am and 10 pm. What changed: how much to take   venlafaxine XR 150 MG 24 hr capsule Commonly known as: EFFEXOR-XR Take 1  capsule (150 mg total) by mouth daily with breakfast.   Vitamin D3 1000 units Caps Take 1,000 Units by mouth in the morning.       Allergies  Allergen Reactions   Celecoxib Anaphylaxis, Swelling, Rash and Other (See Comments)    Tongue swells   Hydrocodone Rash and Other (See Comments)    "Blisters developed on arms"; tolerated Norco okay (per sister)    Sulfa Antibiotics Rash      The results of significant diagnostics from this hospitalization (including imaging, microbiology, ancillary and laboratory) are listed below for reference.    Significant Diagnostic Studies: IR US ABSCESS DRAIN PLACEMENT  Result Date: 01/19/2023 INDICATION: 57 year old male with rectus sheath abscess surrounding the tract of the suprapubic catheter tubing. He presents for drain placement. EXAM: Ultrasound-guided placement of abscess drain MEDICATIONS: The patient is currently admitted to the hospital and receiving intravenous antibiotics. The antibiotics were administered within an appropriate time frame prior to the initiation of the procedure. ANESTHESIA/SEDATION: Fentanyl 50 mcg IV; Versed 1 mg IV administered by the radiology nurse Moderate Sedation Time:  10 minutes The patient's vital signs and level of consciousness were continuously monitored during the procedure by the interventional radiology nurse under my direct supervision. COMPLICATIONS: None immediate. PROCEDURE: Informed written consent was obtained from the patient after a thorough discussion of the procedural risks, benefits and alternatives. All questions were addressed. Maximal Sterile Barrier Technique was utilized including caps, mask, sterile gowns, sterile gloves, sterile drape, hand hygiene and skin antiseptic. A timeout was performed prior to the initiation of the procedure. The anterior abdominal wall was interrogated with ultrasound. An irregular fluid collection is successfully identified. A suitable skin entry site was identified and  local anesthesia was attained by infiltration with 1% lidocaine. A small dermatotomy was made. Under real-time ultrasound guidance, an 18 gauge trocar needle was carefully advanced into the complex fluid collection within the rectus abdominus musculature. A 0.035 wire was then coiled in the collection. The needle was removed. The tract was dilated to 13 [place]. A 12 French skater drainage catheter was then advanced over the wire and formed. Aspiration yields approximately 7 mL of purulent bloody fluid. This was sent for Gram stain and culture. The catheter was then flushed and connected to JP bulb suction before being secured to the skin with 0 Prolene suture. The patient tolerated the procedure well. IMPRESSION: Successful placement of a 12 French drainage catheter into the rectus abdominus abscess cavity. Electronically Signed   By: Malachy Moan M.D.   On: 01/19/2023 16:10   CT ABDOMEN PELVIS W CONTRAST  Result Date: 01/18/2023 CLINICAL DATA:  Status post suprapubic bladder catheter. Abdominal pain EXAM: CT ABDOMEN AND PELVIS WITH CONTRAST TECHNIQUE: Multidetector CT imaging of the abdomen and pelvis was performed using the  standard protocol following bolus administration of intravenous contrast. RADIATION DOSE REDUCTION: This exam was performed according to the departmental dose-optimization program which includes automated exposure control, adjustment of the mA and/or kV according to patient size and/or use of iterative reconstruction technique. CONTRAST:  OMNIPAQUE IOHEXOL 300 MG/ML  SOLN COMPARISON:  Procedure CT 12/26/2022.  Standard CT 06/21/2022. FINDINGS: Lower chest: There is some linear opacity lung bases likely scar or atelectasis. No pleural effusion. Slight breathing motion at the lung bases. Coronary artery calcifications are seen. Please correlate for other coronary risk factors. Hepatobiliary: No focal liver abnormality is seen. Status post cholecystectomy. No biliary dilatation.  Patent portal vein. Pancreas: Global atrophy of the pancreas.  No obvious mass. Spleen: Normal in size without focal abnormality. Adrenals/Urinary Tract: Adrenal glands are preserved. Small lower pole right-sided benign-appearing Bosniak 2 cyst. No specific follow-up. The ureters have normal course and caliber down to the bladder. Suprapubic catheter in place. The bladder is underdistended. There is significant bladder wall thickening there is some air in the lumen. Along the course of the catheter is a small fluid collection with the air. The collection likely has wall enhancement. This measures 6.7 by 3.7 cm. Please correlate for any evidence of a urine leak. Please correlate for abscess formation. This does track superiorly along the course of the rectus abdominis muscle itself as seen on sagittal series 9, image 79. Stomach/Bowel: Stomach is within normal limits. Appendix appears normal. No evidence of bowel wall thickening, distention, or inflammatory changes. Vascular/Lymphatic: Scattered vascular calcifications. Normal caliber aorta and IVC. There are some prominent retroperitoneal nodes seen at the level of the celiac axis. These are are more prominent than usually seen but not pathologic by size criteria. These could be reactive. Reproductive: Prostate is unremarkable. Other: No free air or free fluid. Musculoskeletal: Scattered degenerative changes along the spine and pelvis. There is some sclerosis of the S1 vertebral level, unchanged from prior CT scan. IMPRESSION: Suprapubic catheter in place. Along the course of the catheter along the pelvic wall is a presumed rim enhancing fluid and gas collection measuring up to 6.7 x 3.7 cm. This tracks along the muscle belly of the rectus abdominis muscle in this location superiorly. Please correlate for any evidence of leak along the catheter versus infection or abscess or both. No additional fluid collections identified. No bowel obstruction free air. Fatty liver  infiltration. Electronically Signed   By: Karen Kays M.D.   On: 01/18/2023 16:30   CT GUIDED SUPERPUBIC CATHETER PLMT  Result Date: 12/26/2022 INDICATION: 57 year old male referred for suprapubic catheter placement, atonic bladder EXAM: CT-GUIDED SUPRAPUBIC CATHETER PLACEMENT COMPARISON:  CT 06/21/2022 MEDICATIONS: None ANESTHESIA/SEDATION: Moderate (conscious) sedation was employed during this procedure. A total of Versed 3.0 mg and Fentanyl 150 mcg was administered intravenously by the radiology nurse. Total intra-service moderate Sedation Time: 14 minutes. The patient's level of consciousness and vital signs were monitored continuously by radiology nursing throughout the procedure under my direct supervision. CONTRAST:  None FLUOROSCOPY: CT COMPLICATIONS: None PROCEDURE: Informed written consent was obtained from the patient and the patient's family after a discussion of the risks, benefits and alternatives to treatment. Questions regarding the procedure were encouraged and answered. The patient and the patient's family understand and consents to the procedure. Patient position in the supine position on the CT gantry table. Scout CT acquired for planning purposes. The suprapubic region was prepped with chlorhexidine in a sterile fashion, and a sterile drape was applied covering the operative field. A  sterile gown and sterile gloves were used for the procedure. Local anesthesia was provided with 1% Lidocaine. Indwelling Foley catheter had been clamped prior to the procedure. The skin and subcutaneous tissues were then generously infiltrated with 1% lidocaine to the level of the anterior wall of the bladder for local anesthesia. A small stab incision was made in the skin, and a Yueh needle/catheter was into the urinary bladder. Using modified Seldinger technique, a 14 French pigtail catheter was placed into the urinary bladder with the pigtail catheter locked. Spontaneous urine drained through the tube  confirming position. Catheter was sutured in position. Final CT images were acquired. Patient tolerated the procedure well and remained hemodynamically stable throughout. No complications were encountered and no significant blood loss was encountered. IMPRESSION: Status post CT-guided, 14 French suprapubic catheter placement. Signed, Yvone Neu. Reyne Dumas, ABVM, RPVI [facility] Radiology PLAN: In 4-6 weeks the patient can return for upsized to balloon retention catheter Electronically Signed   By: Gilmer Mor D.O.   On: 12/26/2022 13:07    Microbiology: Recent Results (from the past 240 hour(s))  Aerobic Culture w Gram Stain (superficial specimen)     Status: None   Collection Time: 01/18/23  6:12 PM   Specimen: Wound  Result Value Ref Range Status   Specimen Description WOUND  Final   Special Requests SUPRAPUBIC  Final   Gram Stain   Final    NO WBC SEEN RARE GRAM POSITIVE COCCI IN PAIRS Performed at [facility], [address], [place], [place] [address]    Culture   Final    RARE ENTEROCOCCUS FAECALIS RARE KLEBSIELLA PNEUMONIAE Confirmed Extended Spectrum Beta-Lactamase Producer (ESBL).  In bloodstream infections from ESBL organisms, carbapenems are preferred over piperacillin/tazobactam. They are shown to have a lower risk of mortality.    Report Status 01/21/2023 FINAL  Final   Organism ID, Bacteria ENTEROCOCCUS FAECALIS  Final   Organism ID, Bacteria KLEBSIELLA PNEUMONIAE  Final      Susceptibility   Enterococcus faecalis - MIC*    AMPICILLIN <=2 SENSITIVE Sensitive     VANCOMYCIN 1 SENSITIVE Sensitive     GENTAMICIN SYNERGY RESISTANT Resistant     * RARE ENTEROCOCCUS FAECALIS   Klebsiella pneumoniae - MIC*    AMPICILLIN >=32 RESISTANT Resistant     CEFEPIME 0.5 SENSITIVE Sensitive     CEFTAZIDIME RESISTANT Resistant     CEFTRIAXONE <=0.25 SENSITIVE Sensitive     CIPROFLOXACIN <=0.25 SENSITIVE Sensitive     GENTAMICIN  <=1 SENSITIVE Sensitive     IMIPENEM 1 SENSITIVE Sensitive     TRIMETH/SULFA <=20 SENSITIVE Sensitive     AMPICILLIN/SULBACTAM >=32 RESISTANT Resistant     PIP/TAZO 16 SENSITIVE Sensitive ug/mL    * RARE KLEBSIELLA PNEUMONIAE  Aerobic/Anaerobic Culture w Gram Stain (surgical/deep wound)     Status: None (Preliminary result)   Collection Time: 01/19/23  4:02 PM   Specimen: Abscess  Result Value Ref Range Status   Specimen Description   Final    ABSCESS Performed at [facility], [address], [place], [place] [address]    Special Requests   Final    NONE Performed at [facility], [address], [place], [place] [address]    Gram Stain   Final    ABUNDANT WBC PRESENT, PREDOMINANTLY PMN RARE GRAM POSITIVE COCCI IN CLUSTERS RARE BUDDING YEAST SEEN Performed at [facility], [address], [place], [place] [address]  Culture   Final    MODERATE KLEBSIELLA PNEUMONIAE FEW ESCHERICHIA COLI Confirmed Extended Spectrum Beta-Lactamase Producer (ESBL).  In bloodstream infections from ESBL organisms, carbapenems are preferred over piperacillin/tazobactam. They are shown to have a lower risk of mortality. FEW ENTEROCOCCUS FAECALIS NO ANAEROBES ISOLATED; CULTURE IN PROGRESS FOR 5 DAYS    Report Status PENDING  Incomplete   Organism ID, Bacteria KLEBSIELLA PNEUMONIAE  Final   Organism ID, Bacteria ESCHERICHIA COLI  Final   Organism ID, Bacteria ENTEROCOCCUS FAECALIS  Final      Susceptibility   Escherichia coli - MIC*    AMPICILLIN >=32 RESISTANT Resistant     CEFEPIME 8 INTERMEDIATE Intermediate     CEFTAZIDIME RESISTANT Resistant     CEFTRIAXONE >=64 RESISTANT Resistant     CIPROFLOXACIN >=4 RESISTANT Resistant     GENTAMICIN <=1 SENSITIVE Sensitive     IMIPENEM <=0.25 SENSITIVE Sensitive     TRIMETH/SULFA >=320 RESISTANT Resistant     AMPICILLIN/SULBACTAM 8 SENSITIVE Sensitive     PIP/TAZO <=4 SENSITIVE Sensitive ug/mL    * FEW  ESCHERICHIA COLI   Enterococcus faecalis - MIC*    AMPICILLIN <=2 SENSITIVE Sensitive     VANCOMYCIN 1 SENSITIVE Sensitive     GENTAMICIN SYNERGY RESISTANT Resistant     * FEW ENTEROCOCCUS FAECALIS   Klebsiella pneumoniae - MIC*    AMPICILLIN >=32 RESISTANT Resistant     CEFEPIME 0.25 SENSITIVE Sensitive     CEFTAZIDIME RESISTANT Resistant     CEFTRIAXONE <=0.25 SENSITIVE Sensitive     CIPROFLOXACIN <=0.25 SENSITIVE Sensitive     GENTAMICIN <=1 SENSITIVE Sensitive     IMIPENEM 0.5 SENSITIVE Sensitive     TRIMETH/SULFA <=20 SENSITIVE Sensitive     AMPICILLIN/SULBACTAM >=32 RESISTANT Resistant     PIP/TAZO 16 SENSITIVE Sensitive ug/mL    * MODERATE KLEBSIELLA PNEUMONIAE     Labs: Basic Metabolic Panel: Recent Labs  Lab 01/18/23 1258 01/19/23 0633 01/20/23 0908 01/22/23 0831  NA 138 136 137 138  K 3.5 3.3* 3.7 3.6  CL 103 105 106 109  CO2 26 22 22  19*  GLUCOSE 180* 170* 87 85  BUN 12 11 14 18   CREATININE 1.07 0.78 0.80 0.70  CALCIUM 9.6 8.5* 8.5* 8.6*   Liver Function Tests: Recent Labs  Lab 01/18/23 1258 01/19/23 0633  AST 14* 11*  ALT 13 11  ALKPHOS 114 82  BILITOT 0.4 0.4  PROT 7.2 5.6*  ALBUMIN 3.4* 2.7*   No results for input(s): "LIPASE", "AMYLASE" in the last 168 hours. No results for input(s): "AMMONIA" in the last 168 hours. CBC: Recent Labs  Lab 01/18/23 1258 01/19/23 0633 01/19/23 1053 01/20/23 0908 01/22/23 0831  WBC 8.2 5.7 5.7 7.1 7.0  NEUTROABS 4.7  --   --  3.9 3.6  HGB 13.2 11.6* 11.6* 11.1* 11.3*  HCT 38.5* 35.0* 35.5* 32.5* 34.6*  MCV 79.5* 81.2 81.6 80.8 83.0  PLT 408* 321 305 311 281   Cardiac Enzymes: No results for input(s): "CKTOTAL", "CKMB", "CKMBINDEX", "TROPONINI" in the last 168 hours. BNP: BNP (last 3 results) Recent Labs    09/28/22 0435 09/29/22 0055 09/30/22 0402  BNP 22.2 14.3 12.8    ProBNP (last 3 results) No results for input(s): "PROBNP" in the last 8760 hours.  CBG: Recent Labs  Lab 01/20/23 1631  01/21/23 0422 01/21/23 0918 01/22/23 0737 01/23/23 0739  GLUCAP 124* 284* 221* 128* 111*       Signed:  Rhetta Mura MD   [facility] 01/23/2023,  9:11 AM

## 2023-01-23 NOTE — Plan of Care (Signed)

## 2023-01-24 DIAGNOSIS — I951 Orthostatic hypotension: Secondary | ICD-10-CM

## 2023-01-24 DIAGNOSIS — Z87898 Personal history of other specified conditions: Secondary | ICD-10-CM | POA: Diagnosis not present

## 2023-01-24 LAB — BASIC METABOLIC PANEL
Anion gap: 10 (ref 5–15)
BUN: 15 mg/dL (ref 6–20)
CO2: 22 mmol/L (ref 22–32)
Calcium: 8.9 mg/dL (ref 8.9–10.3)
Chloride: 109 mmol/L (ref 98–111)
Creatinine, Ser: 0.69 mg/dL (ref 0.61–1.24)
GFR, Estimated: >60 mL/min (ref >60)
Glucose, Bld: 94 mg/dL (ref 70–99)
Potassium: 3.5 mmol/L (ref 3.5–5.1)
Sodium: 141 mmol/L (ref 135–145)

## 2023-01-24 LAB — GLUCOSE, CAPILLARY
Glucose-Capillary: 96 mg/dL (ref 70–99)
Glucose-Capillary: 211 mg/dL — ABNORMAL HIGH (ref 70–99)
Glucose-Capillary: 125 mg/dL — ABNORMAL HIGH (ref 70–99)
Glucose-Capillary: 165 mg/dL — ABNORMAL HIGH (ref 70–99)

## 2023-01-24 LAB — CBC WITH DIFFERENTIAL/PLATELET
Abs Immature Granulocytes: 0.04 10*3/uL (ref 0.00–0.07)
Basophils Absolute: 0.1 10*3/uL (ref 0.0–0.1)
Basophils Relative: 1 %
Eosinophils Absolute: 0.2 10*3/uL (ref 0.0–0.5)
Eosinophils Relative: 3 %
HCT: 34.6 % — ABNORMAL LOW (ref 39.0–52.0)
Hemoglobin: 11.1 g/dL — ABNORMAL LOW (ref 13.0–17.0)
Immature Granulocytes: 1 %
Lymphocytes Relative: 41 %
Lymphs Abs: 2.3 10*3/uL (ref 0.7–4.0)
MCH: 27 pg (ref 26.0–34.0)
MCHC: 32.1 g/dL (ref 30.0–36.0)
MCV: 84.2 fL (ref 80.0–100.0)
Monocytes Absolute: 0.5 10*3/uL (ref 0.1–1.0)
Monocytes Relative: 9 %
Neutro Abs: 2.5 10*3/uL (ref 1.7–7.7)
Neutrophils Relative %: 45 %
Platelets: 279 10*3/uL (ref 150–400)
RBC: 4.11 MIL/uL — ABNORMAL LOW (ref 4.22–5.81)
RDW: 12.8 % (ref 11.5–15.5)
WBC: 5.6 10*3/uL (ref 4.0–10.5)
nRBC: 0 % (ref 0.0–0.2)

## 2023-01-24 LAB — AEROBIC/ANAEROBIC CULTURE W GRAM STAIN (SURGICAL/DEEP WOUND)

## 2023-01-24 MED ORDER — CARVEDILOL 3.125 MG PO TABS: 3.1250 mg | ORAL_TABLET | Freq: Two times a day (BID) | ORAL | Status: DC

## 2023-01-24 MED ORDER — AMLODIPINE BESYLATE 5 MG PO TABS
5.0000 mg | ORAL_TABLET | Freq: Every day | ORAL | Status: DC
  Administered 2023-01-25: 5 mg via ORAL
  Filled 2023-01-24: qty 1

## 2023-01-24 NOTE — Progress Notes (Signed)
Orthostatic Vitals    01/24/23 0900  Orthostatic Lying   BP- Lying 103/57  Pulse- Lying 80  Orthostatic Sitting  BP- Sitting (!) 56/45  Pulse- Sitting 84  Orthostatic Standing at 0 minutes  BP- Standing at 0 minutes (!) 51/41  Pulse- Standing at 0 minutes 84    Pt was unable to stand for BP to fully take. Symptomatic for dizziness and blood draining from face. His BP flashed for approx 1-2 seconds as listed above. He sat down and the following pressure was obtained in sitting  Orthostatic Sitting  BP- Sitting 107/77  Pulse- Sitting 75   Pt adamant about not sitting in the chair this AM despite OT building it up with pillows. Returned supine and brought HOB up to 39 degrees, attempting to put more in a chair position. RN aware of BP. Pt did order breakfast of scrambled eggs, hashbrown potatoes, toast and apple juice. Pt provided with coke and water and encouraged to consume protein and liquids.   Full OT note to follow.  Nyoka Cowden OTR/L Acute Rehabilitation Services Office: [phone]

## 2023-01-24 NOTE — Plan of Care (Signed)

## 2023-01-24 NOTE — Progress Notes (Signed)
Notified Dr. Alanda Slim regarding orthostatic vitals. Will apply ted hose and abdominal binder. Okay to give amlodipine. Will recheck orthostatic vitals.

## 2023-01-24 NOTE — Evaluation (Signed)
Occupational Therapy Evaluation Patient Details Name: Vincent [name] MRN: [id number] DOB: Sep 27, 1965 Today's Date: 01/24/2023   History of Present Illness Pt is a 57 year old male admitted 01/18/23 for Suprapubic abscess: S/p CT guided SPT 12/26/2022 with subsequent infection. CT A/P 11/7 with rim enhancing fluid and gas collection along course of catheter.  PMHx: CVA, DM, peripheral neuropathy, GAD, chronic pain syndrome, bipolar 2 disorder, PTSD, RA, CAD, COPD, GERD, h/o bladder cancer and gastroparesis.   Clinical Impression   Pt reports typically mod I with Rollator and sister assists with IADL. Pt today limited by orthostatic pressures (see prior note) and symptomatic. Pt reports that he has been largely laying in bed or laying on the couch for the past few months due to dizziness/lightheadedness. Pt today is at least mod A for LB ADL and set up to mod A for UB ADL. Pt was able to maintain seated position at [facility] - monitored closely due to being symptomatic for dropping BP. OT will continue to follow acutely. Recommending <3 hour daily post-acute rehab to maximize safety and independence in ADL and functional transfers.        If plan is discharge home, recommend the following: A lot of help with bathing/dressing/bathroom;A lot of help with walking and/or transfers;Assist for transportation;Help with stairs or ramp for entrance;Assistance with cooking/housework    Functional Status Assessment  Patient has had a recent decline in their functional status and demonstrates the ability to make significant improvements in function in a reasonable and predictable amount of time.  Equipment Recommendations  Other (comment) (defer to next venue of care)    Recommendations for Other Services PT consult     Precautions / Restrictions Precautions Precautions: Fall Precaution Comments: suprapubic catheter with leg bag + foley catheter, (R) JP drain Restrictions Weight Bearing Restrictions: No       Mobility Bed Mobility Overal bed mobility: Needs Assistance Bed Mobility: Supine to Sit, Sit to Supine     Supine to sit: Mod assist (trunk elevation) Sit to supine: Contact guard assist (assist for extra lines (no line awareness))   General bed mobility comments: Assist for trunk to upright position. Increased time. Heavy reliance on bedrail. Weak    Transfers Overall transfer level: Needs assistance Equipment used: Rolling walker (2 wheels) Transfers: Sit to/from Stand Sit to Stand: Mod assist           General transfer comment: mod A to boost into standing Cues for safety, technique, hand/feet placement. High fall risk. Pt braces backs of legs against sitting surface to get leverage to rise and to stabilize against. C/O dizziness.      Balance Overall balance assessment: Needs assistance, History of Falls Sitting-balance support: Single extremity supported, Feet supported Sitting balance-Leahy Scale: Poor     Standing balance support: Reliant on assistive device for balance, Bilateral upper extremity supported, During functional activity Standing balance-Leahy Scale: Poor                             ADL either performed or assessed with clinical judgement   ADL Overall ADL's : Needs assistance/impaired Eating/Feeding: Set up;Sitting Eating/Feeding Details (indicate cue type and reason): "i can't open cans because of my arthritis" Grooming: Set up Grooming Details (indicate cue type and reason): Pt is able to bring hands to face, limited by dizziness this session Upper Body Bathing: Moderate assistance;Sitting   Lower Body Bathing: Moderate assistance;Sitting/lateral leans   Upper Body  Dressing : Set up   Lower Body Dressing: Maximal assistance;Sit to/from stand   Toilet Transfer: Moderate assistance;Rolling walker (2 wheels) Toilet Transfer Details (indicate cue type and reason): simulated through steps up the bed Toileting- Clothing  Manipulation and Hygiene: Moderate assistance;Sit to/from stand Toileting - Clothing Manipulation Details (indicate cue type and reason): suprapubic catheter     Functional mobility during ADLs: Moderate assistance;Rolling walker (2 wheels);+2 for safety/equipment General ADL Comments: decreased activity tolerance, fatigues quickly, orthostatic     Vision Ability to See in Adequate Light: 0 Adequate Patient Visual Report: No change from baseline       Perception         Praxis         Pertinent Vitals/Pain Pain Assessment Pain Assessment: Faces Faces Pain Scale: Hurts a little bit Pain Location: generalized Pain Descriptors / Indicators: Grimacing, Guarding, Sore Pain Intervention(s): Monitored during session, Repositioned     Extremity/Trunk Assessment Upper Extremity Assessment Upper Extremity Assessment: Generalized weakness   Lower Extremity Assessment Lower Extremity Assessment: Defer to PT evaluation   Cervical / Trunk Assessment Cervical / Trunk Assessment: Normal   Communication Communication Communication: No apparent difficulties   Cognition Arousal: Alert Behavior During Therapy: WFL for tasks assessed/performed Overall Cognitive Status: Within Functional Limits for tasks assessed                                       General Comments       Exercises     Shoulder Instructions      Home Living Family/patient expects to be discharged to:: Private residence Living Arrangements: Other relatives (sister) Available Help at Discharge: Family Type of Home: Mobile home Home Access: Stairs to enter Entrance Stairs-Number of Steps: 5 Entrance Stairs-Rails: Right;Left Home Layout: One level     Bathroom Shower/Tub: Walk-in shower;Tub/shower unit   Bathroom Toilet: Handicapped height Bathroom Accessibility: Yes How Accessible: Accessible via walker Home Equipment: Rollator (4 wheels);Cane - single point;Shower seat   Additional  Comments: sister lives with pt but can't provide physical assist per pt, Per patient he has basically been lying on the couch or in his bed the past few months.      Prior Functioning/Environment Prior Level of Function : History of Falls (last six months);Independent/Modified Independent             Mobility Comments: mod I with use of rollator at all times, reports significant fall history but that has decreased in recent 2 months per pt ADLs Comments: mod I with dressing and toileting. Sister assists with showering and IADLs        OT Problem List: Decreased strength;Decreased activity tolerance;Impaired balance (sitting and/or standing);Decreased safety awareness;Impaired sensation      OT Treatment/Interventions: Self-care/ADL training;Therapeutic exercise;Energy conservation;DME and/or AE instruction;Therapeutic activities;Patient/family education;Balance training    OT Goals(Current goals can be found in the care plan section) Acute Rehab OT Goals Patient Stated Goal: get BP figured out OT Goal Formulation: With patient Time For Goal Achievement: 02/07/23 Potential to Achieve Goals: Good ADL Goals Pt Will Perform Grooming: with modified independence;sitting Pt Will Perform Upper Body Dressing: with modified independence;sitting Pt Will Perform Lower Body Dressing: with supervision;sit to/from stand Pt Will Transfer to Toilet: with supervision;ambulating Pt Will Perform Toileting - Clothing Manipulation and hygiene: with supervision;sit to/from stand Pt/caregiver will Perform Home Exercise Program: Increased strength;Both right and left upper extremity;With theraband;Independently Additional ADL  Goal #1: Pt will verbalize at least 3 strategies for energy conservation for ADL routine with no cues  OT Frequency: Min 1X/week    Co-evaluation              AM-PAC OT "6 Clicks" Daily Activity     Outcome Measure Help from another person eating meals?: A Little Help from  another person taking care of personal grooming?: A Little Help from another person toileting, which includes using toliet, bedpan, or urinal?: A Lot Help from another person bathing (including washing, rinsing, drying)?: A Lot Help from another person to put on and taking off regular upper body clothing?: A Little Help from another person to put on and taking off regular lower body clothing?: A Lot 6 Click Score: 15   End of Session Equipment Utilized During Treatment: Gait belt;Rolling walker (2 wheels) Nurse Communication: Mobility status;Other (comment) (BP)  Activity Tolerance: Other (comment) (limited by orthostatic values) Patient left: in bed;with call bell/phone within reach;with bed alarm set  OT Visit Diagnosis: Unsteadiness on feet (R26.81);Repeated falls (R29.6);History of falling (Z91.81);Muscle weakness (generalized) (M62.81);Dizziness and giddiness (R42)                Time: 5784-6962 OT Time Calculation (min): 32 min Charges:  OT General Charges $OT Visit: 1 Visit OT Evaluation $OT Eval Moderate Complexity: 1 Mod OT Treatments $Self Care/Home Management : 8-22 mins  Nyoka Cowden OTR/L Acute Rehabilitation Services Office: [phone]  [facility] 01/24/2023, 9:39 AM

## 2023-01-24 NOTE — TOC Progression Note (Addendum)
Transition of Care [facility]) - Progression Note    Patient Details  Name: Vincent [name] MRN: [id number] Date of Birth: 04-02-1965  [facility]) CM/SW Contact  Beckie Busing, RN Phone Number:[phone]  01/24/2023, 9:49 AM  Clinical Narrative:    Insurance Berkley Harvey has been approved Plan auth ID [id number], Milinda Pointer [id number] 11/13-11/15 approved next review 11/15. Message has been sent to Kia at [facility] to determine if facility can accept patient today. Awaiting response.  1010 Per MD we will hold off on discharge until tomorrow due to patient being orthostatic. [facility]ia at [facility] has been updated.    Expected Discharge Plan: Skilled Nursing Facility Barriers to Discharge: Continued Medical Work up  Expected Discharge Plan and Services In-house Referral: Clinical Social Work   Post Acute Care Choice: Skilled Nursing Facility Living arrangements for the past 2 months: Single Family Home Expected Discharge Date: 01/23/23                                     Social Determinants of Health (SDOH) Interventions SDOH Screenings   Food Insecurity: No Food Insecurity (01/18/2023)  Housing: Low Risk  (01/18/2023)  Transportation Needs: No Transportation Needs (01/18/2023)  Utilities: Not At Risk (01/18/2023)  Alcohol Screen: Low Risk  (07/31/2021)  Social Connections: Unknown (07/17/2021)   Received from [facility]  Tobacco Use: High Risk (01/18/2023)    Readmission Risk Interventions    01/22/2023   11:58 AM 05/26/2022    1:26 PM  Readmission Risk Prevention Plan  Transportation Screening Complete   PCP or Specialist Appt within 3-5 Days Complete Complete  HRI or Home Care Consult Complete Complete  Social Work Consult for Recovery Care Planning/Counseling Complete Complete  Palliative Care Screening Not Applicable Not Applicable  Medication Review Oceanographer) Referral to Pharmacy Complete

## 2023-01-24 NOTE — Progress Notes (Signed)
PROGRESS NOTE  Vincent [name] UXL:[id number] DOB: 1965/10/22   PCP: Dois Davenport, MD  Patient is from: Home.  DOA: 01/18/2023 LOS: 6  Chief complaints Chief Complaint  Patient presents with   Abscess     Brief Narrative / Interim history: 57 year old M with PMH of poorly controlled DM-2 with gastroparesis and neuropathy, CVA, COPD, RA, bladder cancer, orthostatic hypotension, anxiety, BPD, chronic pain syndrome, seizure, tremor and recent right gluteal abscess for which she had I&D on 09/27/2022 presenting with discharge from around suprapubic catheter, and admitted for abdominal wall abscess.  Patient was started on broad-spectrum antibiotics.  IR consulted and he had drain placed on 11/8.  Patient was treated with vancomycin, ceftriaxone and micafungin from 11/7-11/10.  Wound culture grew ESBL Klebsiella in addition to E. coli and Enterococcus faecalis.  Case discussed with ID, [place].  Antibiotics switched to meropenem on 11/11, then ertapenem on 11/12.  Eventually, infection control felt to be achieved with very minimal output from JP drain and antibiotic discontinued.  Urology was consulted and recommended continuing suprapubic and indwelling penile Foley catheter until outpatient follow-up.  Patient was to discharge to SNF, but very orthostatic.  He has history of orthostatic hypotension.  He has abdominal binder and TED hose at home.  Reports not using his abdominal binder due to claustrophobia.  Subjective: Seen and examined earlier this morning.  No major events overnight of this morning.  Very orthostatic with sitting and standing BP dropping to 50s/40s while working with therapy.  He is resting BP is in 130s/70s.  Objective: Vitals:   01/23/23 1944 01/24/23 0507 01/24/23 1046 01/24/23 1426  BP: (!) 143/78 134/75 134/74 131/76  Pulse: 86 78  80  Resp: 18 18  16   Temp: 98 F (36.7 C) 97.6 F (36.4 C)  99.1 F (37.3 C)  TempSrc: Oral Oral  Oral  SpO2: 97% 97%  96%   Weight:      Height:        Examination:  GENERAL: No apparent distress.  Nontoxic. HEENT: MMM.  Vision and hearing grossly intact.  NECK: Supple.  No apparent JVD.  RESP:  No IWOB.  Fair aeration bilaterally. CVS:  RRR. Heart sounds normal.  ABD/GI/GU: BS+. Abd soft, NTND.  Suprapubic and penile catheter in place. MSK/EXT:  Moves extremities. No apparent deformity. No edema.  SKIN: no apparent skin lesion or wound NEURO: Awake, alert and oriented appropriately.  No apparent focal neuro deficit. PSYCH: Calm. Normal affect.   Procedures:  11/8-US guided drain placement for abdominal wall abscess by IR  Microbiology summarized: 11/7-suprapubic wound culture with rare Enterococcus faecalis and rare Klebsiella pneumonia 11/8-abscess culture with few Enterococcus faecalis, Klebsiella pneumonia and E. coli  Assessment and plan: Abdominal wall abscess in the setting of suprapubic catheter: CT abdomen and pelvis showed rim-enhancing fluid and gas collection measuring 6.7 x 3.7 cm along the course of catheter along the pelvic wall. S/p IR drain placement by IR on 11/8.  Culture data as above. -Vancomycin and ceftriaxone 11/7>> meropenem 11/11>>  ertapenem 11/12 -Micafungin 11/9-11/12 -Per discussion between prior attending and ID, source control achieved and antibiotic discontinued. -Urology recommended maintaining suprapubic catheter and indwelling Foley catheter  Chronic orthostatic hypotension: Likely due to autonomic dysregulation from poorly controlled diabetes.  Patient has abdominal binder and TED hose at home but not able to use abdominal binder due to claustrophobia.  BP dropped to 50/40 with sitting and standing but lying blood pressure 130s/70s.  Not able to use  midodrine with normal lying BP. -Will try abdominal binder and TED hose here -Elevate head of bed -Decrease Coreg to 3.125 mg twice daily -Continue amlodipine  Poorly controlled DM-2 with gastroparesis, neuropathy and  autonomic dysregulation: A1c 12.4% in 09/2022 Recent Labs  Lab 01/23/23 1404 01/23/23 1704 01/23/23 2045 01/24/23 0718 01/24/23 1220  GLUCAP 146* 99 193* 96 211*  -Recheck hemoglobin A1c -Continue current insulin regimen  History of anxiety/bipolar disorder/claustrophobia -Continue home meds.  Rheumatoid arthritis: On Plaquenil 200 twice daily -hold Plaquenil given poor healing etc. resume once follow with rheum   History of CVA/prior TIA -Continue home Plavix and Crestor  Chronic COPD?  Stable  Chronic pain: Stable -Continue home meds  Body mass index is 22.78 kg/m.           DVT prophylaxis:  Place TED hose Start: 01/24/23 0810 heparin injection 5,000 Units Start: 01/18/23 2200  Code Status: Full code Family Communication: None at bedside Level of care: Med-Surg Status is: Inpatient Remains inpatient appropriate because: Orthostatic hypotension   Final disposition: SNF Consultants:  Infectious disease Urology  55 minutes with more than 50% spent in reviewing records, counseling patient/family and coordinating care.   Sch Meds:  Scheduled Meds:  amLODipine  10 mg Oral Daily   carvedilol  6.25 mg Oral BID WC   Chlorhexidine Gluconate Cloth  6 each Topical Daily   clopidogrel  75 mg Oral QHS   heparin  5,000 Units Subcutaneous Q8H   insulin aspart  0-6 Units Subcutaneous TID WC   insulin aspart  3 Units Subcutaneous TID WC   insulin glargine-yfgn  12 Units Subcutaneous BID   lidocaine-EPINEPHrine  20 mL Intradermal Once   pantoprazole  40 mg Oral Daily   pregabalin  200 mg Oral TID   sodium chloride flush  3 mL Intravenous Q12H   sodium chloride flush  5 mL Intracatheter Q8H   venlafaxine XR  150 mg Oral Q breakfast   Continuous Infusions: PRN Meds:.alum & mag hydroxide-simeth, hydrOXYzine, influenza vac split trivalent PF, nicotine, ondansetron  Antimicrobials: Anti-infectives (From admission, onward)    Start     Dose/Rate Route Frequency  Ordered Stop   01/23/23 0945  ertapenem (INVANZ) 1 g in sodium chloride 0.9 % 100 mL IVPB        1 g 200 mL/hr over 30 Minutes Intravenous  Once 01/23/23 0853 01/23/23 1013   01/22/23 1800  ampicillin (OMNIPEN) 2 g in sodium chloride 0.9 % 100 mL IVPB  Status:  Discontinued        2 g 300 mL/hr over 20 Minutes Intravenous Every 6 hours 01/22/23 1034 01/22/23 1053   01/22/23 1400  meropenem (MERREM) 1 g in sodium chloride 0.9 % 100 mL IVPB  Status:  Discontinued        1 g 200 mL/hr over 30 Minutes Intravenous Every 8 hours 01/22/23 1034 01/22/23 1053   01/22/23 1200  imipenem-cilastatin (PRIMAXIN) 500 mg in sodium chloride 0.9 % 100 mL IVPB  Status:  Discontinued        500 mg 200 mL/hr over 30 Minutes Intravenous Every 6 hours 01/22/23 1053 01/23/23 0853   01/20/23 1600  micafungin (MYCAMINE) 100 mg in sodium chloride 0.9 % 100 mL IVPB  Status:  Discontinued        100 mg 105 mL/hr over 1 Hours Intravenous Every 24 hours 01/20/23 1510 01/23/23 0853   01/19/23 1800  vancomycin (VANCOREADY) IVPB 1250 mg/250 mL  Status:  Discontinued  1,250 mg 166.7 mL/hr over 90 Minutes Intravenous Every 12 hours 01/19/23 0859 01/22/23 1034   01/19/23 1600  cefTRIAXone (ROCEPHIN) 2 g in sodium chloride 0.9 % 100 mL IVPB  Status:  Discontinued        2 g 200 mL/hr over 30 Minutes Intravenous Every 24 hours 01/19/23 0210 01/22/23 1034   01/19/23 0600  vancomycin (VANCOCIN) IVPB 1000 mg/200 mL premix  Status:  Discontinued        1,000 mg 200 mL/hr over 60 Minutes Intravenous Every 12 hours 01/19/23 0212 01/19/23 0859   01/18/23 1630  vancomycin (VANCOREADY) IVPB 1500 mg/300 mL        1,500 mg 150 mL/hr over 120 Minutes Intravenous STAT 01/18/23 1615 01/18/23 1925   01/18/23 1615  cefTRIAXone (ROCEPHIN) 2 g in sodium chloride 0.9 % 100 mL IVPB        2 g 200 mL/hr over 30 Minutes Intravenous  Once 01/18/23 1606 01/18/23 1647        I have personally reviewed the following labs and  images: CBC: Recent Labs  Lab 01/18/23 1258 01/19/23 0633 01/19/23 1053 01/20/23 0908 01/22/23 0831 01/24/23 0533  WBC 8.2 5.7 5.7 7.1 7.0 5.6  NEUTROABS 4.7  --   --  3.9 3.6 2.5  HGB 13.2 11.6* 11.6* 11.1* 11.3* 11.1*  HCT 38.5* 35.0* 35.5* 32.5* 34.6* 34.6*  MCV 79.5* 81.2 81.6 80.8 83.0 84.2  PLT 408* 321 305 311 281 279   BMP &GFR Recent Labs  Lab 01/18/23 1258 01/19/23 0633 01/20/23 0908 01/22/23 0831 01/24/23 0533  NA 138 136 137 138 141  K 3.5 3.3* 3.7 3.6 3.5  CL 103 105 106 109 109  CO2 26 22 22  19* 22  GLUCOSE 180* 170* 87 85 94  BUN 12 11 14 18 15   CREATININE 1.07 0.78 0.80 0.70 0.69  CALCIUM 9.6 8.5* 8.5* 8.6* 8.9   Estimated Creatinine Clearance: 112.4 mL/min (by C-G formula based on SCr of 0.69 mg/dL). Liver & Pancreas: Recent Labs  Lab 01/18/23 1258 01/19/23 0633  AST 14* 11*  ALT 13 11  ALKPHOS 114 82  BILITOT 0.4 0.4  PROT 7.2 5.6*  ALBUMIN 3.4* 2.7*   No results for input(s): "LIPASE", "AMYLASE" in the last 168 hours. No results for input(s): "AMMONIA" in the last 168 hours. Diabetic: No results for input(s): "HGBA1C" in the last 72 hours. Recent Labs  Lab 01/23/23 1404 01/23/23 1704 01/23/23 2045 01/24/23 0718 01/24/23 1220  GLUCAP 146* 99 193* 96 211*   Cardiac Enzymes: No results for input(s): "CKTOTAL", "CKMB", "CKMBINDEX", "TROPONINI" in the last 168 hours. No results for input(s): "PROBNP" in the last 8760 hours. Coagulation Profile: Recent Labs  Lab 01/19/23 1053  INR 0.9   Thyroid Function Tests: No results for input(s): "TSH", "T4TOTAL", "FREET4", "T3FREE", "THYROIDAB" in the last 72 hours. Lipid Profile: No results for input(s): "CHOL", "HDL", "LDLCALC", "TRIG", "CHOLHDL", "LDLDIRECT" in the last 72 hours. Anemia Panel: No results for input(s): "VITAMINB12", "FOLATE", "FERRITIN", "TIBC", "IRON", "RETICCTPCT" in the last 72 hours. Urine analysis:    Component Value Date/Time   COLORURINE YELLOW 10/29/2022 2011    APPEARANCEUR CLEAR 10/29/2022 2011   LABSPEC 1.026 10/29/2022 2011   PHURINE 6.5 10/29/2022 2011   GLUCOSEU >1,000 (A) 10/29/2022 2011   HGBUR SMALL (A) 10/29/2022 2011   BILIRUBINUR NEGATIVE 10/29/2022 2011   KETONESUR NEGATIVE 10/29/2022 2011   PROTEINUR 30 (A) 10/29/2022 2011   UROBILINOGEN 1.0 03/04/2014 2312   NITRITE NEGATIVE 10/29/2022 2011  LEUKOCYTESUR NEGATIVE 10/29/2022 2011   Sepsis Labs: Invalid input(s): "PROCALCITONIN", "LACTICIDVEN"  Microbiology: Recent Results (from the past 240 hour(s))  Aerobic Culture w Gram Stain (superficial specimen)     Status: None   Collection Time: 01/18/23  6:12 PM   Specimen: Wound  Result Value Ref Range Status   Specimen Description WOUND  Final   Special Requests SUPRAPUBIC  Final   Gram Stain   Final    NO WBC SEEN RARE GRAM POSITIVE COCCI IN PAIRS Performed at [facility], [address], [place], [place] [address]    Culture   Final    RARE ENTEROCOCCUS FAECALIS RARE KLEBSIELLA PNEUMONIAE Confirmed Extended Spectrum Beta-Lactamase Producer (ESBL).  In bloodstream infections from ESBL organisms, carbapenems are preferred over piperacillin/tazobactam. They are shown to have a lower risk of mortality.    Report Status 01/21/2023 FINAL  Final   Organism ID, Bacteria ENTEROCOCCUS FAECALIS  Final   Organism ID, Bacteria KLEBSIELLA PNEUMONIAE  Final      Susceptibility   Enterococcus faecalis - MIC*    AMPICILLIN <=2 SENSITIVE Sensitive     VANCOMYCIN 1 SENSITIVE Sensitive     GENTAMICIN SYNERGY RESISTANT Resistant     * RARE ENTEROCOCCUS FAECALIS   Klebsiella pneumoniae - MIC*    AMPICILLIN >=32 RESISTANT Resistant     CEFEPIME 0.5 SENSITIVE Sensitive     CEFTAZIDIME RESISTANT Resistant     CEFTRIAXONE <=0.25 SENSITIVE Sensitive     CIPROFLOXACIN <=0.25 SENSITIVE Sensitive     GENTAMICIN <=1 SENSITIVE Sensitive     IMIPENEM 1 SENSITIVE Sensitive     TRIMETH/SULFA <=20 SENSITIVE Sensitive      AMPICILLIN/SULBACTAM >=32 RESISTANT Resistant     PIP/TAZO 16 SENSITIVE Sensitive ug/mL    * RARE KLEBSIELLA PNEUMONIAE  Aerobic/Anaerobic Culture w Gram Stain (surgical/deep wound)     Status: None   Collection Time: 01/19/23  4:02 PM   Specimen: Abscess  Result Value Ref Range Status   Specimen Description   Final    ABSCESS Performed at [facility], [address] [place], [place] [address]    Special Requests   Final    NONE Performed at [facility], [address], [place], [place] [address]    Gram Stain   Final    ABUNDANT WBC PRESENT, PREDOMINANTLY PMN RARE GRAM POSITIVE COCCI IN CLUSTERS RARE BUDDING YEAST SEEN    Culture   Final    MODERATE KLEBSIELLA PNEUMONIAE FEW ESCHERICHIA COLI Confirmed Extended Spectrum Beta-Lactamase Producer (ESBL).  In bloodstream infections from ESBL organisms, carbapenems are preferred over piperacillin/tazobactam. They are shown to have a lower risk of mortality. FEW ENTEROCOCCUS FAECALIS NO ANAEROBES ISOLATED Performed at [facility], [address], [place], [place] [address]    Report Status 01/24/2023 FINAL  Final   Organism ID, Bacteria KLEBSIELLA PNEUMONIAE  Final   Organism ID, Bacteria ESCHERICHIA COLI  Final   Organism ID, Bacteria ENTEROCOCCUS FAECALIS  Final      Susceptibility   Escherichia coli - MIC*    AMPICILLIN >=32 RESISTANT Resistant     CEFEPIME 8 INTERMEDIATE Intermediate     CEFTAZIDIME RESISTANT Resistant     CEFTRIAXONE >=64 RESISTANT Resistant     CIPROFLOXACIN >=4 RESISTANT Resistant     GENTAMICIN <=1 SENSITIVE Sensitive     IMIPENEM <=0.25 SENSITIVE Sensitive     TRIMETH/SULFA >=320 RESISTANT Resistant     AMPICILLIN/SULBACTAM 8 SENSITIVE Sensitive     PIP/TAZO <=4  SENSITIVE Sensitive ug/mL    * FEW ESCHERICHIA COLI   Enterococcus faecalis - MIC*    AMPICILLIN <=2 SENSITIVE Sensitive     VANCOMYCIN 1 SENSITIVE Sensitive     GENTAMICIN SYNERGY RESISTANT  Resistant     * FEW ENTEROCOCCUS FAECALIS   Klebsiella pneumoniae - MIC*    AMPICILLIN >=32 RESISTANT Resistant     CEFEPIME 0.25 SENSITIVE Sensitive     CEFTAZIDIME RESISTANT Resistant     CEFTRIAXONE <=0.25 SENSITIVE Sensitive     CIPROFLOXACIN <=0.25 SENSITIVE Sensitive     GENTAMICIN <=1 SENSITIVE Sensitive     IMIPENEM 0.5 SENSITIVE Sensitive     TRIMETH/SULFA <=20 SENSITIVE Sensitive     AMPICILLIN/SULBACTAM >=32 RESISTANT Resistant     PIP/TAZO 16 SENSITIVE Sensitive ug/mL    * MODERATE KLEBSIELLA PNEUMONIAE    Radiology Studies: No results found.    Elanore Talcott T. Mileena Rothenberger [facility]  If 7PM-7AM, please contact night-coverage [email or web address] 01/24/2023, 2:28 PM

## 2023-01-25 DIAGNOSIS — Z87898 Personal history of other specified conditions: Secondary | ICD-10-CM | POA: Diagnosis not present

## 2023-01-25 LAB — GLUCOSE, CAPILLARY
Glucose-Capillary: 137 mg/dL — ABNORMAL HIGH (ref 70–99)
Glucose-Capillary: 99 mg/dL (ref 70–99)

## 2023-01-25 MED ORDER — SODIUM CHLORIDE 0.9% FLUSH: 5.0000 mL | Freq: Three times a day (TID) | INTRAVENOUS | Status: DC

## 2023-01-25 MED ORDER — AMLODIPINE BESYLATE 5 MG PO TABS: 5.0000 mg | ORAL_TABLET | Freq: Every day | ORAL | Status: DC

## 2023-01-25 NOTE — TOC Transition Note (Signed)
Transition of Care [facility]) - CM/SW Discharge Note   Patient Details  Name: Vincent [name] MRN: [id number] Date of Birth: September 16, 1965  [facility] CM/SW Contact:  Beckie Busing, RN Phone Number:[phone]  01/25/2023, 1:27 PM Patient discharging to [facility]. Transportation has been arranged via [facility]. Discharge packet is at nurses station. Please call report to [facility] [phone]. Nurse has been informed. Patient states that he has called his sister and there is no one else to call. There are no other TOC needs noted. TOC will sign off.   Clinical Narrative:       Final next level of care: Skilled Nursing Facility Barriers to Discharge: No Barriers Identified   Patient Goals and CMS Choice   Choice offered to / list presented to : Patient  Discharge Placement                Patient chooses bed at: [facility] Patient to be transferred to facility by: [facility] Name of family member notified: Patient states that he has already made sister aware and does not wish for CM to call Patient and family notified of of transfer: 01/25/23  Discharge Plan and Services Additional resources added to the After Visit Summary for   In-house Referral: Clinical Social Work   Post Acute Care Choice: Skilled Nursing Facility          DME Arranged: N/A DME Agency: NA       HH Arranged: NA HH Agency: NA        Social Determinants of Health (SDOH) Interventions SDOH Screenings   Food Insecurity: No Food Insecurity (01/18/2023)  Housing: Low Risk  (01/18/2023)  Transportation Needs: No Transportation Needs (01/18/2023)  Utilities: Not At Risk (01/18/2023)  Alcohol Screen: Low Risk  (07/31/2021)  Social Connections: Unknown (07/17/2021)   Received from [facility]  Tobacco Use: High Risk (01/18/2023)     Readmission Risk Interventions    01/22/2023   11:58 AM 05/26/2022    1:26 PM  Readmission Risk Prevention Plan   Transportation Screening Complete   PCP or Specialist Appt within 3-5 Days Complete Complete  HRI or Home Care Consult Complete Complete  Social Work Consult for Recovery Care Planning/Counseling Complete Complete  Palliative Care Screening Not Applicable Not Applicable  Medication Review [employer]) Referral to Pharmacy Complete

## 2023-01-25 NOTE — Progress Notes (Signed)
Attempted report to [facility]. No answer, will try back.

## 2023-01-25 NOTE — Discharge Summary (Addendum)
Physician Discharge Summary  Vincent [name] [name]:[id number] DOB: 1965-10-22 DOA: 01/18/2023  PCP: Dois Davenport, MD  Admit date: 01/18/2023 Discharge date: 01/25/2023 Admitted From: Home Disposition: SNF Recommendations for Outpatient Follow-up:   Reassess blood pressure, glycemic control, CMP and CBC in 1 week Outpatient follow-up with IR on 02/06/2023 Outpatient follow-up with urology Given his orthostatic hypotension, supine BP might not be reliable.  Consider checking sitting or standing BP Please follow up on the following pending results: None   Discharge Condition: Stable CODE STATUS: Full code  Follow-up Information     Vincent Hick, MD. Schedule an appointment as soon as possible for a visit in 2 week(s).   Specialty: Urology Contact information: [address] [place] [address] [phone]                 Hospital course 57 year old M with PMH of poorly controlled DM-2 with gastroparesis and neuropathy, CVA, COPD, RA, bladder cancer, orthostatic hypotension, anxiety, BPD, chronic pain syndrome, seizure, tremor and recent right gluteal abscess for which she had I&D on 09/27/2022 presenting with discharge from around suprapubic catheter, and admitted for abdominal wall abscess.  Patient was started on broad-spectrum antibiotics.  IR consulted and he had drain placed on 11/8.  Patient was treated with vancomycin, ceftriaxone and micafungin from 11/7-11/10.  Wound culture grew ESBL Klebsiella in addition to E. coli and Enterococcus faecalis.  Case discussed with ID, [place].  Antibiotics switched to meropenem on 11/11, then ertapenem on 11/12.  Eventually, infection control felt to be achieved with very minimal output from JP drain and antibiotic discontinued.  Urology was consulted and recommended continuing suprapubic and indwelling penile Foley catheter until outpatient follow-up.  Patient to follow-up with IR outpatient on 11/26 for abscess drain injection study  along with suprapubic cath exchange and possible foley removal on 11/26.    Patient was to discharge to SNF.   See individual problem list below for more.   Problems addressed during this hospitalization Abdominal wall abscess in the setting of suprapubic catheter: CT abdomen and pelvis showed rim-enhancing fluid and gas collection measuring 6.7 x 3.7 cm along the course of catheter along the pelvic wall. S/p IR drain placement by IR on 11/8.  Culture data as above. -Vancomycin and ceftriaxone 11/7>> meropenem 11/11>>  ertapenem 11/12 -Micafungin 11/9-11/12 -Per discussion between prior attending and ID, source control achieved and antibiotic discontinued. -Urology recommended maintaining suprapubic catheter and indwelling Foley catheter -Outpatient follow-up with IR on 02/06/2023 -Continue normal saline flush every 8 hours -Outpatient follow-up with urology in 2 to 3 weeks   Chronic orthostatic hypotension: Likely due to autonomic dysregulation from poorly controlled diabetes.  Patient has abdominal binder and TED hose at home but not able to use abdominal binder due to claustrophobia.  BP dropped to 50/40 with sitting and standing but lying blood pressure 130s/70s.  Orthostatic hypotension improved after discontinuing Coreg, decreasing amlodipine, applying TED hose and abdominal binder -Continue abdominal binder and TED hose -Supine BP might not be reliable.  Recommend sitting or standing BP before adding blood pressure medication  Poorly controlled DM-2 with gastroparesis, neuropathy and autonomic dysregulation: A1c 12.4% in 09/2022.  Suspected noncompliance with meds. -Basal insulin decreased to 15 units twice daily based on his insulin requirement in house -Reassess glycemic control and adjust as appropriate  History of anxiety/bipolar disorder/claustrophobia -Continue home meds.   Rheumatoid arthritis: Does not seem to be on DMARDs. -Outpatient follow-up   History of CVA/prior  TIA -  Continue home Plavix and Crestor   Chronic COPD?  Stable   Chronic pain: Stable -Continue home Lyrica.           Time spent 35 minutes  Vital signs Vitals:   01/24/23 1431 01/24/23 1434 01/24/23 2118 01/25/23 0542  BP: 110/69 (!) 93/59 127/75 (!) 143/79  Pulse: 80 88 84 79  Temp:   98.7 F (37.1 C) 98.2 F (36.8 C)  Resp:   20 20  Height:      Weight:      SpO2: 98% (!) 87% 99% 97%  TempSrc:   Oral Oral  BMI (Calculated):         Discharge exam  GENERAL: No apparent distress.  Nontoxic. HEENT: MMM.  Vision and hearing grossly intact.  NECK: Supple.  No apparent JVD.  RESP:  No IWOB.  Fair aeration bilaterally. CVS:  RRR. Heart sounds normal.  ABD/GI/GU: BS+. Abd soft, NTND.  Suprapubic and penile catheter in place.  JP drain in place. MSK/EXT:  Moves extremities. No apparent deformity. No edema.  SKIN: no apparent skin lesion or wound NEURO: Sleepy but wakes to voice.  Oriented appropriately.  No apparent focal neuro deficit. PSYCH: Calm. Normal affect.  Discharge Instructions Discharge Instructions     Diet - low sodium heart healthy   Complete by: As directed    Increase activity slowly   Complete by: As directed    No wound care   Complete by: As directed       Allergies as of 01/25/2023       Reactions   Celecoxib Anaphylaxis, Swelling, Rash, Other (See Comments)   Tongue swells   Hydrocodone Rash, Other (See Comments)   "Blisters developed on arms"; tolerated Norco okay (per sister)   Sulfa Antibiotics Rash        Medication List     STOP taking these medications    acetaminophen 325 MG tablet Commonly known as: TYLENOL   carvedilol 6.25 MG tablet Commonly known as: COREG   cefdinir 300 MG capsule Commonly known as: OMNICEF   HumaLOG KwikPen 200 UNIT/ML KwikPen Generic drug: insulin lispro   linezolid 600 MG tablet Commonly known as: ZYVOX   nystatin powder Commonly known as: MYCOSTATIN/NYSTOP   oxyCODONE 5 MG  immediate release tablet Commonly known as: Oxy IR/ROXICODONE   rOPINIRole 0.5 MG tablet Commonly known as: REQUIP       TAKE these medications    amLODipine 5 MG tablet Commonly known as: NORVASC Take 1 tablet (5 mg total) by mouth daily. What changed:  medication strength how much to take   clopidogrel 75 MG tablet Commonly known as: PLAVIX Take 75 mg by mouth at bedtime.   ezetimibe 10 MG tablet Commonly known as: ZETIA Take 1 tablet (10 mg total) by mouth daily.   hydrOXYzine 25 MG tablet Commonly known as: ATARAX Take 25 mg by mouth at bedtime.   insulin aspart 100 UNIT/ML injection Commonly known as: novoLOG Inject 3 Units into the skin 3 (three) times daily with meals.   nicotine 21 mg/24hr patch Commonly known as: NICODERM CQ - dosed in mg/24 hours Place 1 patch (21 mg total) onto the skin daily.   omeprazole 40 MG capsule Commonly known as: PRILOSEC Take 40 mg by mouth daily before breakfast.   pregabalin 200 MG capsule Commonly known as: LYRICA Take 1 capsule (200 mg total) by mouth 3 (three) times daily.   rosuvastatin 40 MG tablet Commonly known as: CRESTOR Take 40 mg  by mouth at bedtime. What changed: Another medication with the same name was removed. Continue taking this medication, and follow the directions you see here.   sodium chloride flush 0.9 % Soln Commonly known as: NS 5 mLs by Intracatheter route every 8 (eight) hours.   Toujeo Max SoloStar 300 UNIT/ML Solostar Pen Generic drug: insulin glargine (2 Unit Dial) Inject 15 Units into the skin 2 (two) times daily at 8 am and 10 pm. What changed: how much to take   venlafaxine XR 150 MG 24 hr capsule Commonly known as: EFFEXOR-XR Take 1 capsule (150 mg total) by mouth daily with breakfast.   Vitamin D3 1000 units Caps Take 1,000 Units by mouth in the morning.        Consultations: Urology Infectious disease IR  Procedures/Studies: 11/8-US guided drain placement for  abdominal wall abscess by IR    IR US ABSCESS DRAIN PLACEMENT  Result Date: 01/19/2023 INDICATION: 57 year old male with rectus sheath abscess surrounding the tract of the suprapubic catheter tubing. He presents for drain placement. EXAM: Ultrasound-guided placement of abscess drain MEDICATIONS: The patient is currently admitted to the hospital and receiving intravenous antibiotics. The antibiotics were administered within an appropriate time frame prior to the initiation of the procedure. ANESTHESIA/SEDATION: Fentanyl 50 mcg IV; Versed 1 mg IV administered by the radiology nurse Moderate Sedation Time:  10 minutes The patient's vital signs and level of consciousness were continuously monitored during the procedure by the interventional radiology nurse under my direct supervision. COMPLICATIONS: None immediate. PROCEDURE: Informed written consent was obtained from the patient after a thorough discussion of the procedural risks, benefits and alternatives. All questions were addressed. Maximal Sterile Barrier Technique was utilized including caps, mask, sterile gowns, sterile gloves, sterile drape, hand hygiene and skin antiseptic. A timeout was performed prior to the initiation of the procedure. The anterior abdominal wall was interrogated with ultrasound. An irregular fluid collection is successfully identified. A suitable skin entry site was identified and local anesthesia was attained by infiltration with 1% lidocaine. A small dermatotomy was made. Under real-time ultrasound guidance, an 18 gauge trocar needle was carefully advanced into the complex fluid collection within the rectus abdominus musculature. A 0.035 wire was then coiled in the collection. The needle was removed. The tract was dilated to 86 [place]. A 12 French skater drainage catheter was then advanced over the wire and formed. Aspiration yields approximately 7 mL of purulent bloody fluid. This was sent for Gram stain and culture. The catheter  was then flushed and connected to JP bulb suction before being secured to the skin with 0 Prolene suture. The patient tolerated the procedure well. IMPRESSION: Successful placement of a 12 French drainage catheter into the rectus abdominus abscess cavity. Electronically Signed   By: Malachy Moan M.D.   On: 01/19/2023 16:10   CT ABDOMEN PELVIS W CONTRAST  Result Date: 01/18/2023 CLINICAL DATA:  Status post suprapubic bladder catheter. Abdominal pain EXAM: CT ABDOMEN AND PELVIS WITH CONTRAST TECHNIQUE: Multidetector CT imaging of the abdomen and pelvis was performed using the standard protocol following bolus administration of intravenous contrast. RADIATION DOSE REDUCTION: This exam was performed according to the departmental dose-optimization program which includes automated exposure control, adjustment of the mA and/or kV according to patient size and/or use of iterative reconstruction technique. CONTRAST:  OMNIPAQUE IOHEXOL 300 MG/ML  SOLN COMPARISON:  Procedure CT 12/26/2022.  Standard CT 06/21/2022. FINDINGS: Lower chest: There is some linear opacity lung bases likely scar or atelectasis. No pleural  effusion. Slight breathing motion at the lung bases. Coronary artery calcifications are seen. Please correlate for other coronary risk factors. Hepatobiliary: No focal liver abnormality is seen. Status post cholecystectomy. No biliary dilatation. Patent portal vein. Pancreas: Global atrophy of the pancreas.  No obvious mass. Spleen: Normal in size without focal abnormality. Adrenals/Urinary Tract: Adrenal glands are preserved. Small lower pole right-sided benign-appearing Bosniak 2 cyst. No specific follow-up. The ureters have normal course and caliber down to the bladder. Suprapubic catheter in place. The bladder is underdistended. There is significant bladder wall thickening there is some air in the lumen. Along the course of the catheter is a small fluid collection with the air. The collection  likely has wall enhancement. This measures 6.7 by 3.7 cm. Please correlate for any evidence of a urine leak. Please correlate for abscess formation. This does track superiorly along the course of the rectus abdominis muscle itself as seen on sagittal series 9, image 79. Stomach/Bowel: Stomach is within normal limits. Appendix appears normal. No evidence of bowel wall thickening, distention, or inflammatory changes. Vascular/Lymphatic: Scattered vascular calcifications. Normal caliber aorta and IVC. There are some prominent retroperitoneal nodes seen at the level of the celiac axis. These are are more prominent than usually seen but not pathologic by size criteria. These could be reactive. Reproductive: Prostate is unremarkable. Other: No free air or free fluid. Musculoskeletal: Scattered degenerative changes along the spine and pelvis. There is some sclerosis of the S1 vertebral level, unchanged from prior CT scan. IMPRESSION: Suprapubic catheter in place. Along the course of the catheter along the pelvic wall is a presumed rim enhancing fluid and gas collection measuring up to 6.7 x 3.7 cm. This tracks along the muscle belly of the rectus abdominis muscle in this location superiorly. Please correlate for any evidence of leak along the catheter versus infection or abscess or both. No additional fluid collections identified. No bowel obstruction free air. Fatty liver infiltration. Electronically Signed   By: Karen Kays M.D.   On: 01/18/2023 16:30   CT GUIDED SUPERPUBIC CATHETER PLMT  Result Date: 12/26/2022 INDICATION: 57 year old male referred for suprapubic catheter placement, atonic bladder EXAM: CT-GUIDED SUPRAPUBIC CATHETER PLACEMENT COMPARISON:  CT 06/21/2022 MEDICATIONS: None ANESTHESIA/SEDATION: Moderate (conscious) sedation was employed during this procedure. A total of Versed 3.0 mg and Fentanyl 150 mcg was administered intravenously by the radiology nurse. Total intra-service moderate Sedation Time:  14 minutes. The patient's level of consciousness and vital signs were monitored continuously by radiology nursing throughout the procedure under my direct supervision. CONTRAST:  None FLUOROSCOPY: CT COMPLICATIONS: None PROCEDURE: Informed written consent was obtained from the patient and the patient's family after a discussion of the risks, benefits and alternatives to treatment. Questions regarding the procedure were encouraged and answered. The patient and the patient's family understand and consents to the procedure. Patient position in the supine position on the CT gantry table. Scout CT acquired for planning purposes. The suprapubic region was prepped with chlorhexidine in a sterile fashion, and a sterile drape was applied covering the operative field. A sterile gown and sterile gloves were used for the procedure. Local anesthesia was provided with 1% Lidocaine. Indwelling Foley catheter had been clamped prior to the procedure. The skin and subcutaneous tissues were then generously infiltrated with 1% lidocaine to the level of the anterior wall of the bladder for local anesthesia. A small stab incision was made in the skin, and a Yueh needle/catheter was into the urinary bladder. Using modified Seldinger technique, a 14  French pigtail catheter was placed into the urinary bladder with the pigtail catheter locked. Spontaneous urine drained through the tube confirming position. Catheter was sutured in position. Final CT images were acquired. Patient tolerated the procedure well and remained hemodynamically stable throughout. No complications were encountered and no significant blood loss was encountered. IMPRESSION: Status post CT-guided, 14 French suprapubic catheter placement. Signed, Yvone Neu. Reyne Dumas, ABVM, RPVI [facility] Radiology PLAN: In 4-6 weeks the patient can return for upsized to balloon retention catheter Electronically Signed   By: Gilmer Mor  D.O.   On: 12/26/2022 13:07       The results of significant diagnostics from this hospitalization (including imaging, microbiology, ancillary and laboratory) are listed below for reference.     Microbiology: Recent Results (from the past 240 hour(s))  Aerobic Culture w Gram Stain (superficial specimen)     Status: None   Collection Time: 01/18/23  6:12 PM   Specimen: Wound  Result Value Ref Range Status   Specimen Description WOUND  Final   Special Requests SUPRAPUBIC  Final   Gram Stain   Final    NO WBC SEEN RARE GRAM POSITIVE COCCI IN PAIRS Performed at [facility], [address], [place], [place] [address]    Culture   Final    RARE ENTEROCOCCUS FAECALIS RARE KLEBSIELLA PNEUMONIAE Confirmed Extended Spectrum Beta-Lactamase Producer (ESBL).  In bloodstream infections from ESBL organisms, carbapenems are preferred over piperacillin/tazobactam. They are shown to have a lower risk of mortality.    Report Status 01/21/2023 FINAL  Final   Organism ID, Bacteria ENTEROCOCCUS FAECALIS  Final   Organism ID, Bacteria KLEBSIELLA PNEUMONIAE  Final      Susceptibility   Enterococcus faecalis - MIC*    AMPICILLIN <=2 SENSITIVE Sensitive     VANCOMYCIN 1 SENSITIVE Sensitive     GENTAMICIN SYNERGY RESISTANT Resistant     * RARE ENTEROCOCCUS FAECALIS   Klebsiella pneumoniae - MIC*    AMPICILLIN >=32 RESISTANT Resistant     CEFEPIME 0.5 SENSITIVE Sensitive     CEFTAZIDIME RESISTANT Resistant     CEFTRIAXONE <=0.25 SENSITIVE Sensitive     CIPROFLOXACIN <=0.25 SENSITIVE Sensitive     GENTAMICIN <=1 SENSITIVE Sensitive     IMIPENEM 1 SENSITIVE Sensitive     TRIMETH/SULFA <=20 SENSITIVE Sensitive     AMPICILLIN/SULBACTAM >=32 RESISTANT Resistant     PIP/TAZO 16 SENSITIVE Sensitive ug/mL    * RARE KLEBSIELLA PNEUMONIAE  Aerobic/Anaerobic Culture w Gram Stain (surgical/deep wound)     Status: None   Collection Time: 01/19/23  4:02 PM   Specimen: Abscess  Result Value Ref Range  Status   Specimen Description   Final    ABSCESS Performed at [facility], [address], [place], [place] [address]    Special Requests   Final    NONE Performed at [facility], [address] [place], [place] [address]    Gram Stain   Final    ABUNDANT WBC PRESENT, PREDOMINANTLY PMN RARE GRAM POSITIVE COCCI IN CLUSTERS RARE BUDDING YEAST SEEN    Culture   Final    MODERATE KLEBSIELLA PNEUMONIAE FEW ESCHERICHIA COLI Confirmed Extended Spectrum Beta-Lactamase Producer (ESBL).  In bloodstream infections from ESBL organisms, carbapenems are preferred over piperacillin/tazobactam. They are shown to have a lower risk of mortality. FEW ENTEROCOCCUS FAECALIS NO ANAEROBES ISOLATED Performed at [facility], [address] [place], [place] [address]  Report Status 01/24/2023 FINAL  Final   Organism ID, Bacteria KLEBSIELLA PNEUMONIAE  Final   Organism ID, Bacteria ESCHERICHIA COLI  Final   Organism ID, Bacteria ENTEROCOCCUS FAECALIS  Final      Susceptibility   Escherichia coli - MIC*    AMPICILLIN >=32 RESISTANT Resistant     CEFEPIME 8 INTERMEDIATE Intermediate     CEFTAZIDIME RESISTANT Resistant     CEFTRIAXONE >=64 RESISTANT Resistant     CIPROFLOXACIN >=4 RESISTANT Resistant     GENTAMICIN <=1 SENSITIVE Sensitive     IMIPENEM <=0.25 SENSITIVE Sensitive     TRIMETH/SULFA >=320 RESISTANT Resistant     AMPICILLIN/SULBACTAM 8 SENSITIVE Sensitive     PIP/TAZO <=4 SENSITIVE Sensitive ug/mL    * FEW ESCHERICHIA COLI   Enterococcus faecalis - MIC*    AMPICILLIN <=2 SENSITIVE Sensitive     VANCOMYCIN 1 SENSITIVE Sensitive     GENTAMICIN SYNERGY RESISTANT Resistant     * FEW ENTEROCOCCUS FAECALIS   Klebsiella pneumoniae - MIC*    AMPICILLIN >=32 RESISTANT Resistant     CEFEPIME 0.25 SENSITIVE Sensitive     CEFTAZIDIME RESISTANT Resistant     CEFTRIAXONE <=0.25 SENSITIVE Sensitive     CIPROFLOXACIN <=0.25 SENSITIVE Sensitive      GENTAMICIN <=1 SENSITIVE Sensitive     IMIPENEM 0.5 SENSITIVE Sensitive     TRIMETH/SULFA <=20 SENSITIVE Sensitive     AMPICILLIN/SULBACTAM >=32 RESISTANT Resistant     PIP/TAZO 16 SENSITIVE Sensitive ug/mL    * MODERATE KLEBSIELLA PNEUMONIAE     Labs:  CBC: Recent Labs  Lab 01/18/23 1258 01/19/23 0633 01/19/23 1053 01/20/23 0908 01/22/23 0831 01/24/23 0533  WBC 8.2 5.7 5.7 7.1 7.0 5.6  NEUTROABS 4.7  --   --  3.9 3.6 2.5  HGB 13.2 11.6* 11.6* 11.1* 11.3* 11.1*  HCT 38.5* 35.0* 35.5* 32.5* 34.6* 34.6*  MCV 79.5* 81.2 81.6 80.8 83.0 84.2  PLT 408* 321 305 311 281 279   BMP &GFR Recent Labs  Lab 01/18/23 1258 01/19/23 0633 01/20/23 0908 01/22/23 0831 01/24/23 0533  NA 138 136 137 138 141  K 3.5 3.3* 3.7 3.6 3.5  CL 103 105 106 109 109  CO2 26 22 22  19* 22  GLUCOSE 180* 170* 87 85 94  BUN 12 11 14 18 15   CREATININE 1.07 0.78 0.80 0.70 0.69  CALCIUM 9.6 8.5* 8.5* 8.6* 8.9   Estimated Creatinine Clearance: 112.4 mL/min (by C-G formula based on SCr of 0.69 mg/dL). Liver & Pancreas: Recent Labs  Lab 01/18/23 1258 01/19/23 0633  AST 14* 11*  ALT 13 11  ALKPHOS 114 82  BILITOT 0.4 0.4  PROT 7.2 5.6*  ALBUMIN 3.4* 2.7*   No results for input(s): "LIPASE", "AMYLASE" in the last 168 hours. No results for input(s): "AMMONIA" in the last 168 hours. Diabetic: No results for input(s): "HGBA1C" in the last 72 hours. Recent Labs  Lab 01/24/23 0718 01/24/23 1220 01/24/23 1651 01/24/23 2114 01/25/23 0738  GLUCAP 96 211* 125* 165* 137*   Cardiac Enzymes: No results for input(s): "CKTOTAL", "CKMB", "CKMBINDEX", "TROPONINI" in the last 168 hours. No results for input(s): "PROBNP" in the last 8760 hours. Coagulation Profile: Recent Labs  Lab 01/19/23 1053  INR 0.9   Thyroid Function Tests: No results for input(s): "TSH", "T4TOTAL", "FREET4", "T3FREE", "THYROIDAB" in the last 72 hours. Lipid Profile: No results for input(s): "CHOL", "HDL", "LDLCALC", "TRIG",  "CHOLHDL", "LDLDIRECT" in the last 72 hours. Anemia Panel: No results for input(s): "VITAMINB12", "FOLATE", "FERRITIN", "  TIBC", "IRON", "RETICCTPCT" in the last 72 hours. Urine analysis:    Component Value Date/Time   COLORURINE YELLOW 10/29/2022 2011   APPEARANCEUR CLEAR 10/29/2022 2011   LABSPEC 1.026 10/29/2022 2011   PHURINE 6.5 10/29/2022 2011   GLUCOSEU >1,000 (A) 10/29/2022 2011   HGBUR SMALL (A) 10/29/2022 2011   BILIRUBINUR NEGATIVE 10/29/2022 2011   KETONESUR NEGATIVE 10/29/2022 2011   PROTEINUR 30 (A) 10/29/2022 2011   UROBILINOGEN 1.0 03/04/2014 2312   NITRITE NEGATIVE 10/29/2022 2011   LEUKOCYTESUR NEGATIVE 10/29/2022 2011   Sepsis Labs: Invalid input(s): "PROCALCITONIN", "LACTICIDVEN"   SIGNED:  Almon Hercules, MD  [facility] 01/25/2023, 11:19 AM

## 2023-02-05 ENCOUNTER — Other Ambulatory Visit: Payer: Self-pay | Admitting: Urology

## 2023-02-05 DIAGNOSIS — Z01818 Encounter for other preprocedural examination: Secondary | ICD-10-CM

## 2023-02-05 NOTE — H&P (Signed)
Chief Complaint: Patient was seen in consultation today for suprapubic catheter line interrogation with possible upsize/exchange and cystogram/rectus sheath abscess drain injection/possible removal, at the request of Dr. Rhetta Mura  Supervising Physician: Roanna Banning  Patient Status: [facility] - Out-pt  History of Present Illness: Vincent [name] is a 57 y.o. male   FULL Code status per patient and chart.  Vincent [name] is a 57 y.o. male with a medical history significant for anxiety, COPD, Bipolar 2 disorder, PTSD, seizures, CAD, chronic pain syndrome, DM2 and bladder cancer with areflexic bladder.   Recent right gluteal cleft abscess status post incision and drainage-underwent general surgery evaluation 09/27/2022 and was discharged on 09/30/2022     The patient is s/p suprapubic catheter placement in IR 12/26/22. Over the past few weeks the patient noted increasing suprapubic discomfort and mild drainage around the site. He was evaluated in the office by Urology and was prescribed an antibiotic for suspected superficial wound infection.    He presented to the ED 01/18/23 with worsening symptoms and CT imaging showed a significant fluid collection around the suprapubic track. CT A/P on 11/7 demonstrated a presumed rim enhancing fluid and gas collection measuring up to 6.7 x 3.7 cm along the pelvic wall which tracked along the muscle belly of the rectus abdominis muscle in this location superiorly.   IR was consulted for and successfully performed US guided placement of 12 French drain into rectus abscess on 01/19/2023. Discharged 11/14.  Unfortunately, suprapubic catheter abscess failed outpatient management with possible blocked SP tube. Patient was seen by Urology, who recommends outpatient follow-up  Plan to keep indwelling penile Foley catheter (placed on 11/10), keep suprapubic catheter additionally until it epithelializes, and on 11/25 can have JP drain interrogation,  removal, and upsize of suprapubic catheter.  IR was requested for SPT line interrogation, with possible subsequent exchange/upsize, and cystogram along with rectus sheath abscess injection/possible removal.    Past Medical History:  Diagnosis Date   Anxiety    Benign essential tremor    Bipolar 2 disorder (HCC)    followed by [facility]--- dr s. Lolly Mustache   Bladder cancer [facility])    recurrent   CAD (coronary artery disease)    cardiac cath 2003  and 2011 both showed normal coronary arteries w/ preserved lvf;  Non obstructive on CTA Oct 2019.    Chronic pain syndrome    back---- [facility] [place]   Cold extremities    BLE   COPD (chronic obstructive pulmonary disease) (HCC)    DDD (degenerative disc disease), lumbar    Diabetic peripheral neuropathy (HCC)    Gastroparesis    followed by dr Marina Goodell   GERD (gastroesophageal reflux disease)    Hiatal hernia    History of bladder cancer urologist-  previously dr Ronal Fear;  now dr gay   papillay TCC (Ta G1)  s/p TURBT and chemo instillation 2014   History of chest pain 12/2017   heart cath normal   History of encephalopathy 05/27/2015   admission w/ acute encephalopathy thought to be secondary to pain meds and COPD   History of gastric ulcer    History of Helicobacter pylori infection    History of kidney stones    History of TIA (transient ischemic attack) 2008  and 10-19-2018    no residual's   History of traumatic head injury 2010   w/ LOC  per pt needed stitches, hit in head with a mower blade   Hyperlipidemia  Hypertension    Hypogonadism male    s/p  bilateral orchiectomy   Hypothyroidism    Insomnia    Mild obstructive sleep apnea    study in epic 12-04-2016, no cpap   PTSD (post-traumatic stress disorder)    chronic   PTSD (post-traumatic stress disorder)    RA (rheumatoid arthritis) (HCC)    followed by [facility]   Seizures, transient [facility]) neurologist-  dr Terrace Arabia--  differential dx complex  partial seizure .vs.  mood disorder .vs.  pseudoseizure--  negative EEG's   confusion episodes and staring spells since 11/ 2015   (03-26-2020 per pt wife last seizure 10 /2021)   Suicide attempt by drug overdose (HCC) 07/28/2021   Transient confusion NEUOROLOGIST-  DR Terrace Arabia   Episodes since 11/ 2015--  neurologist dx  differential complex partial seizure  .vs. mood disorder . vs. pseudoseizure   Type 2 diabetes mellitus treated with insulin [facility])    endocrinologist--- dr Everardo All---  (03-26-2020 pt does not check blood sugar at home)    Past Surgical History:  Procedure Laterality Date   AMPUTATION Left 04/28/2020   Procedure: LEFT LITTLE FINGER AMPUTATION;  Surgeon: Nadara Mustard, MD;  Location: [facility] OR;  Service: Orthopedics;  Laterality: Left;   CARDIAC CATHETERIZATION  12-27-2001  DR Jacinto Halim  &  05-26-2009  DR Eldridge Dace   RESULTS FOR BOTH ARE NORMAL CORONARIES AND PERSERVED LVF/ EF 60%   CARPAL TUNNEL RELEASE Bilateral right 09-16-2003;  left ?   CARPAL TUNNEL RELEASE Left 02/25/2015   Procedure: LEFT CARPAL TUNNEL RELEASE;  Surgeon: Betha Loa, MD;  Location: [facility];  Service: Orthopedics;  Laterality: Left;   CYSTOSCOPY N/A 10/10/2012   Procedure: CYSTOSCOPY CLOT EVACUATION FULGERATION OF BLEEDERS ;  Surgeon: Garnett Farm, MD;  Location: [facility];  Service: Urology;  Laterality: N/A;   CYSTOSCOPY WITH BIOPSY N/A 11/26/2015   Procedure: CYSTOSCOPY WITH BIOPSY AND FULGURATION;  Surgeon: Ihor Gully, MD;  Location: [facility];  Service: Urology;  Laterality: N/A;   ESOPHAGOGASTRODUODENOSCOPY  2014   IR US GUIDE BX ASP/DRAIN  01/19/2023   IRRIGATION AND DEBRIDEMENT ABSCESS Right 09/27/2022   Procedure: IRRIGATION AND DEBRIDEMENT OF RIGHT BUTTOCK  ABSCESS;  Surgeon: Griselda Miner, MD;  Location: [facility] OR;  Service: General;  Laterality: Right;   LAPAROSCOPIC CHOLECYSTECTOMY  11-17-2010   ORCHIECTOMY Right 02/21/2016   Procedure: SCROTAL  ORCHIECTOMY with TESTICULAR PROSTHESIS IMPLANT;  Surgeon: Ihor Gully, MD;  Location: [facility];  Service: Urology;  Laterality: Right;   ORCHIECTOMY Left 09/02/2018   Procedure: ORCHIECTOMY;  Surgeon: Ihor Gully, MD;  Location: [facility];  Service: Urology;  Laterality: Left;   ROTATOR CUFF REPAIR Right 12/2004   TRANSURETHRAL RESECTION OF BLADDER TUMOR N/A 08/09/2012   Procedure: TRANSURETHRAL RESECTION OF BLADDER TUMOR (TURBT) WITH GYRUS WITH MITOMYCIN C;  Surgeon: Garnett Farm, MD;  Location: [facility];  Service: Urology;  Laterality: N/A;   TRANSURETHRAL RESECTION OF BLADDER TUMOR N/A 03/29/2020   Procedure: TRANSURETHRAL RESECTION OF BLADDER TUMOR (TURBT) and post-op instillation of gemcitabine;  Surgeon: [name], MD;  Location: [facility];  Service: Urology;  Laterality: N/A;   TRANSURETHRAL RESECTION OF BLADDER TUMOR WITH GYRUS (TURBT-GYRUS) N/A 02/27/2014   Procedure: TRANSURETHRAL RESECTION OF BLADDER TUMOR WITH GYRUS (TURBT-GYRUS);  Surgeon: Garnett Farm, MD;  Location: [facility];  Service: Urology;  Laterality: N/A;    Allergies: Celecoxib, Hydrocodone,  and Sulfa antibiotics  Medications: Prior to Admission medications   Medication Sig Start Date End Date Taking? Authorizing Provider  amLODipine (NORVASC) 5 MG tablet Take 1 tablet (5 mg total) by mouth daily. 01/25/23   Almon Hercules, MD  Cholecalciferol (VITAMIN D3) 1000 units CAPS Take 1,000 Units by mouth in the morning.    [provider]  clopidogrel (PLAVIX) 75 MG tablet Take 75 mg by mouth at bedtime.    [provider]  ezetimibe (ZETIA) 10 MG tablet Take 1 tablet (10 mg total) by mouth daily. Patient not taking: Reported on 01/18/2023 10/01/22   Leroy Sea, MD  hydrOXYzine (ATARAX) 25 MG tablet Take 25 mg by mouth at bedtime. 08/01/22   [provider]  insulin aspart (NOVOLOG) 100 UNIT/ML  injection Inject 3 Units into the skin 3 (three) times daily with meals. 01/23/23   Rhetta Mura, MD  insulin glargine, 2 Unit Dial, (TOUJEO MAX SOLOSTAR) 300 UNIT/ML Solostar Pen Inject 15 Units into the skin 2 (two) times daily at 8 am and 10 pm. 01/23/23   Rhetta Mura, MD  nicotine (NICODERM CQ - DOSED IN MG/24 HOURS) 21 mg/24hr patch Place 1 patch (21 mg total) onto the skin daily. Patient not taking: Reported on 01/18/2023 08/30/22   Marguerita Merles Latif, DO  omeprazole (PRILOSEC) 40 MG capsule Take 40 mg by mouth daily before breakfast. 08/23/21   [provider]  pregabalin (LYRICA) 200 MG capsule Take 1 capsule (200 mg total) by mouth 3 (three) times daily. 01/23/23   Rhetta Mura, MD  rosuvastatin (CRESTOR) 40 MG tablet Take 40 mg by mouth at bedtime.    [provider]  sodium chloride flush (NS) 0.9 % SOLN 5 mLs by Intracatheter route every 8 (eight) hours. 01/25/23   Almon Hercules, MD  venlafaxine XR (EFFEXOR-XR) 150 MG 24 hr capsule Take 1 capsule (150 mg total) by mouth daily with breakfast. 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   Left-handed.   3-4 cups caffeine per day.   Social Determinants of Health   Financial Resource Strain: Not on file  Food Insecurity: No Food Insecurity (01/18/2023)   Hunger Vital Sign    Worried About Running Out of Food in the Last Year: Never true    Ran Out of Food in the Last Year: Never true  Transportation Needs: No Transportation Needs (01/18/2023)   PRAPARE - Administrator, Civil Service (Medical): No    Lack of Transportation (Non-Medical): No  Physical Activity: Not on file  Stress: Not on file  Social Connections: Unknown (07/17/2021)   Received from [facility], [facility]   Social Network    Social Network: Not on file      Review of Systems: denies fever,HA,CP,dyspnea, cough, back pain,N/V or bleeding; he does have some lower abd discomfort  Vital Signs: Vitals:   02/06/23 1117  BP: 129/72  Pulse: 88  Resp: 20  Temp: 98.3 F (36.8 C)  SpO2: 98%        Physical Exam: awake/alert; chest- CTA bilat; heart- RRR; abd-soft,+BS, intact rt rectus sheath drain with minimal amount beige colored fluid; SP cath draining sl hazy, yellow urine, foley cath in place; no LE edema  Imaging: IR US ABSCESS DRAIN PLACEMENT  Result Date: 01/19/2023 INDICATION: 57 year old male with rectus sheath abscess surrounding the tract of the suprapubic catheter tubing. He presents for drain placement. EXAM: Ultrasound-guided placement of abscess drain MEDICATIONS: The patient is currently admitted to the hospital and receiving intravenous antibiotics. The antibiotics were administered within an appropriate time frame prior to the initiation of the procedure. ANESTHESIA/SEDATION: Fentanyl 50 mcg IV; Versed 1 mg IV administered by the  radiology nurse Moderate Sedation Time:  10 minutes The patient's vital signs and level of consciousness were continuously monitored during the procedure by the interventional radiology nurse under my direct supervision. COMPLICATIONS: None immediate. PROCEDURE: Informed written consent was obtained from the patient after a thorough discussion of the procedural risks, benefits and alternatives. All questions were addressed. Maximal Sterile Barrier Technique was utilized including caps, mask, sterile gowns, sterile gloves, sterile drape, hand hygiene and skin antiseptic. A timeout was performed prior to the initiation of the procedure. The anterior abdominal wall was interrogated with ultrasound. An irregular fluid collection is successfully identified. A suitable skin entry site was identified and local anesthesia was attained by infiltration with 1% lidocaine. A small dermatotomy was made. Under real-time ultrasound guidance, an 18 gauge trocar needle was carefully advanced into the complex fluid collection within the rectus abdominus musculature. A 0.035 wire was then coiled in the collection. The needle was removed. The tract was dilated to 24 [place]. A 12 French skater drainage catheter was then advanced over the wire and formed. Aspiration yields approximately 7 mL of purulent bloody fluid. This was sent for Gram stain and culture. The catheter was then flushed and connected to JP bulb suction before being secured to the skin with 0 Prolene suture. The patient tolerated the procedure well. IMPRESSION: Successful placement of a 12 French drainage catheter into the rectus abdominus abscess cavity. Electronically Signed   By: Malachy Moan M.D.   On: 01/19/2023 16:10   CT ABDOMEN PELVIS W CONTRAST  Result Date: 01/18/2023 CLINICAL DATA:  Status post suprapubic bladder catheter. Abdominal pain EXAM: CT ABDOMEN AND PELVIS WITH CONTRAST TECHNIQUE: Multidetector CT imaging of the abdomen and pelvis was  performed using the standard protocol following bolus administration of intravenous contrast. RADIATION DOSE REDUCTION: This exam was performed according to the departmental dose-optimization program which includes automated exposure control, adjustment of the mA and/or kV  according to patient size and/or use of iterative reconstruction technique. CONTRAST:  OMNIPAQUE IOHEXOL 300 MG/ML  SOLN COMPARISON:  Procedure CT 12/26/2022.  Standard CT 06/21/2022. FINDINGS: Lower chest: There is some linear opacity lung bases likely scar or atelectasis. No pleural effusion. Slight breathing motion at the lung bases. Coronary artery calcifications are seen. Please correlate for other coronary risk factors. Hepatobiliary: No focal liver abnormality is seen. Status post cholecystectomy. No biliary dilatation. Patent portal vein. Pancreas: Global atrophy of the pancreas.  No obvious mass. Spleen: Normal in size without focal abnormality. Adrenals/Urinary Tract: Adrenal glands are preserved. Small lower pole right-sided benign-appearing Bosniak 2 cyst. No specific follow-up. The ureters have normal course and caliber down to the bladder. Suprapubic catheter in place. The bladder is underdistended. There is significant bladder wall thickening there is some air in the lumen. Along the course of the catheter is a small fluid collection with the air. The collection likely has wall enhancement. This measures 6.7 by 3.7 cm. Please correlate for any evidence of a urine leak. Please correlate for abscess formation. This does track superiorly along the course of the rectus abdominis muscle itself as seen on sagittal series 9, image 79. Stomach/Bowel: Stomach is within normal limits. Appendix appears normal. No evidence of bowel wall thickening, distention, or inflammatory changes. Vascular/Lymphatic: Scattered vascular calcifications. Normal caliber aorta and IVC. There are some prominent retroperitoneal nodes seen at the level of the  celiac axis. These are are more prominent than usually seen but not pathologic by size criteria. These could be reactive. Reproductive: Prostate is unremarkable. Other: No free air or free fluid. Musculoskeletal: Scattered degenerative changes along the spine and pelvis. There is some sclerosis of the S1 vertebral level, unchanged from prior CT scan. IMPRESSION: Suprapubic catheter in place. Along the course of the catheter along the pelvic wall is a presumed rim enhancing fluid and gas collection measuring up to 6.7 x 3.7 cm. This tracks along the muscle belly of the rectus abdominis muscle in this location superiorly. Please correlate for any evidence of leak along the catheter versus infection or abscess or both. No additional fluid collections identified. No bowel obstruction free air. Fatty liver infiltration. Electronically Signed   By: Karen Kays M.D.   On: 01/18/2023 16:30    Labs:  CBC: Recent Labs    01/19/23 1053 01/20/23 0908 01/22/23 0831 01/24/23 0533  WBC 5.7 7.1 7.0 5.6  HGB 11.6* 11.1* 11.3* 11.1*  HCT 35.5* 32.5* 34.6* 34.6*  PLT 305 311 281 279    COAGS: Recent Labs    08/25/22 2137 09/24/22 1555 12/26/22 0931 01/19/23 1053  INR 1.0 0.9 0.9 0.9  APTT 30 30  --   --     BMP: Recent Labs    01/19/23 0633 01/20/23 0908 01/22/23 0831 01/24/23 0533  NA 136 137 138 141  K 3.3* 3.7 3.6 3.5  CL 105 106 109 109  CO2 22 22 19* 22  GLUCOSE 170* 87 85 94  BUN 11 14 18 15   CALCIUM 8.5* 8.5* 8.6* 8.9  CREATININE 0.78 0.80 0.70 0.69  GFRNONAA >60 >60 >60 >60    LIVER FUNCTION TESTS: Recent Labs    09/25/22 0202 09/26/22 0225 01/18/23 1258 01/19/23 0633  BILITOT 0.7 0.6 0.4 0.4  AST 17 15 14* 11*  ALT 17 18 13 11   ALKPHOS 65 75 114 82  PROT 5.0* 5.3* 7.2 5.6*  ALBUMIN 2.7* 2.7* 3.4* 2.7*    TUMOR MARKERS: No results  for input(s): "AFPTM", "CEA", "CA199", "CHROMGRNA" in the last 8760 hours.  Assessment and Plan: Vincent [name] is a 57 y.o. male  with a medical history significant for anxiety, COPD, Bipolar 2 disorder, PTSD, seizures, CAD, chronic pain syndrome, DM2 and bladder cancer with areflexic bladder  Per Dr. Dillard Essex progress note on 11/11: CT-guided SPT 12/26/2022 with subsequent infection.  CT A/P on 11/7 notes rim-enhancing fluid and gas collection along catheter.  J/P drain placed in abscess with interventional radiology on 01/19/2023.  Over weekend noted to have return of purulent fluid at first and then what appeared to be clear urine.  Foley catheter was placed to maximize drainage with consequent drop in JP drain output.  SPT reported to be clogged. Flushed without resistance on rounds today. Possible sediment obstruction that passed? We will not request to have it exchanged at this time.  Foley cath to stay in place for the time being to maximize drainage.  Okay to cap SPT.  In roughly 2 weeks patient will be at the 6-week mark and tract should have properly epithelialized.  Will have him follow-up with interventional radiology then.  Interrogate drain at that time and if no sign of bladder communication, pull JP drain, Foley catheter, and exchange/upsize SPT. Reviewed plan with IR on-call and they are in agreement.   Cystogram in 2-3 weeks with SPT exchange and upsize.   Patient presents for SP tube line study with subsequent SP tube exchange/upsize if amenable. Cystogram to follow. Also for rectus sheath abscess drain injection with possible removal.   Risks and benefits discussed with the patient including bleeding, infection, damage to adjacent structures, bowel perforation/fistula connection, and sepsis.  All of the patient's questions were answered, patient is agreeable to proceed. Consent signed and in chart.   Thank you for this interesting consult.  I greatly enjoyed meeting Vincent [name] [facility] and look forward to participating in their care.  A copy of this report was sent to the requesting provider on this  date.  Electronically Signed: Sable Feil, PA-C/Kevin Santasia Rew,PA-C 02/05/2023, 1:46 PM   I spent a total of  20 minutes  in face to face in clinical consultation, greater than 50% of which was counseling/coordinating care for SP tube line study with subsequent SP tube exchange/upsize and cystogram if amenable/rectus sheath abscess drain evaluation/possible removal

## 2023-02-06 ENCOUNTER — Ambulatory Visit (HOSPITAL_COMMUNITY)
Admission: RE | Admit: 2023-02-06 | Discharge: 2023-02-06 | Disposition: A | Discharge status: Home / Self Care | Payer: 59 | Source: Ambulatory Visit | Attending: Family Medicine | Admitting: Family Medicine

## 2023-02-06 ENCOUNTER — Ambulatory Visit (HOSPITAL_COMMUNITY)
Admission: RE | Admit: 2023-02-06 | Discharge: 2023-02-06 | Disposition: A | Discharge status: Home / Self Care | Payer: 59 | Source: Ambulatory Visit | Attending: Family Medicine

## 2023-02-06 ENCOUNTER — Other Ambulatory Visit (HOSPITAL_COMMUNITY): Payer: Self-pay | Admitting: Radiology

## 2023-02-06 ENCOUNTER — Encounter (HOSPITAL_COMMUNITY): Payer: Self-pay

## 2023-02-06 ENCOUNTER — Other Ambulatory Visit: Payer: Self-pay

## 2023-02-06 DIAGNOSIS — Z435 Encounter for attention to cystostomy: Secondary | ICD-10-CM | POA: Diagnosis present

## 2023-02-06 DIAGNOSIS — E119 Type 2 diabetes mellitus without complications: Secondary | ICD-10-CM | POA: Diagnosis not present

## 2023-02-06 DIAGNOSIS — Z01818 Encounter for other preprocedural examination: Secondary | ICD-10-CM

## 2023-02-06 DIAGNOSIS — F4310 Post-traumatic stress disorder, unspecified: Secondary | ICD-10-CM | POA: Insufficient documentation

## 2023-02-06 DIAGNOSIS — G894 Chronic pain syndrome: Secondary | ICD-10-CM | POA: Diagnosis not present

## 2023-02-06 DIAGNOSIS — J449 Chronic obstructive pulmonary disease, unspecified: Secondary | ICD-10-CM | POA: Diagnosis not present

## 2023-02-06 DIAGNOSIS — Z87898 Personal history of other specified conditions: Secondary | ICD-10-CM

## 2023-02-06 DIAGNOSIS — F3181 Bipolar II disorder: Secondary | ICD-10-CM | POA: Diagnosis not present

## 2023-02-06 DIAGNOSIS — Z794 Long term (current) use of insulin: Secondary | ICD-10-CM | POA: Diagnosis not present

## 2023-02-06 DIAGNOSIS — F419 Anxiety disorder, unspecified: Secondary | ICD-10-CM | POA: Insufficient documentation

## 2023-02-06 DIAGNOSIS — I2510 Atherosclerotic heart disease of native coronary artery without angina pectoris: Secondary | ICD-10-CM | POA: Insufficient documentation

## 2023-02-06 DIAGNOSIS — F17210 Nicotine dependence, cigarettes, uncomplicated: Secondary | ICD-10-CM | POA: Diagnosis not present

## 2023-02-06 DIAGNOSIS — Z8551 Personal history of malignant neoplasm of bladder: Secondary | ICD-10-CM | POA: Insufficient documentation

## 2023-02-06 DIAGNOSIS — L0291 Cutaneous abscess, unspecified: Secondary | ICD-10-CM

## 2023-02-06 HISTORY — PX: IR SINUS/FIST TUBE CHK-NON GI: IMG673

## 2023-02-06 HISTORY — PX: IR CATHETER TUBE CHANGE: IMG717

## 2023-02-06 LAB — CBC
HCT: 37.1 % — ABNORMAL LOW (ref 39.0–52.0)
Hemoglobin: 11.9 g/dL — ABNORMAL LOW (ref 13.0–17.0)
MCH: 27 pg (ref 26.0–34.0)
MCHC: 32.1 g/dL (ref 30.0–36.0)
MCV: 84.3 fL (ref 80.0–100.0)
Platelets: 222 10*3/uL (ref 150–400)
RBC: 4.4 MIL/uL (ref 4.22–5.81)
RDW: 13.2 % (ref 11.5–15.5)
WBC: 7.1 10*3/uL (ref 4.0–10.5)
nRBC: 0 % (ref 0.0–0.2)

## 2023-02-06 LAB — BASIC METABOLIC PANEL
Anion gap: 15 (ref 5–15)
BUN: 19 mg/dL (ref 6–20)
CO2: 26 mmol/L (ref 22–32)
Calcium: 9.5 mg/dL (ref 8.9–10.3)
Chloride: 102 mmol/L (ref 98–111)
Creatinine, Ser: 0.86 mg/dL (ref 0.61–1.24)
GFR, Estimated: >60 mL/min (ref >60)
Glucose, Bld: 202 mg/dL — ABNORMAL HIGH (ref 70–99)
Potassium: 4.4 mmol/L (ref 3.5–5.1)
Sodium: 143 mmol/L (ref 135–145)

## 2023-02-06 LAB — PROTIME-INR
INR: 1 (ref 0.8–1.2)
Prothrombin Time: 13.2 s (ref 11.4–15.2)

## 2023-02-06 LAB — GLUCOSE, CAPILLARY: Glucose-Capillary: 187 mg/dL — ABNORMAL HIGH (ref 70–99)

## 2023-02-06 MED ORDER — LIDOCAINE VISCOUS HCL 2 % MT SOLN
OROMUCOSAL | Status: AC
  Filled 2023-02-06: qty 15

## 2023-02-06 MED ORDER — MIDAZOLAM HCL 2 MG/2ML IJ SOLN
INTRAMUSCULAR | Status: AC | PRN
  Administered 2023-02-06: 1 mg via INTRAVENOUS

## 2023-02-06 MED ORDER — FENTANYL CITRATE (PF) 100 MCG/2ML IJ SOLN
INTRAMUSCULAR | Status: AC
  Filled 2023-02-06: qty 4

## 2023-02-06 MED ORDER — IOHEXOL 300 MG/ML  SOLN
50.0000 mL | Freq: Once | INTRAMUSCULAR | Status: AC | PRN
  Administered 2023-02-06: 20 mL

## 2023-02-06 MED ORDER — FENTANYL CITRATE (PF) 100 MCG/2ML IJ SOLN
INTRAMUSCULAR | Status: AC | PRN
  Administered 2023-02-06: 50 ug via INTRAVENOUS

## 2023-02-06 MED ORDER — SODIUM CHLORIDE 0.9% FLUSH: 5.0000 mL | Freq: Three times a day (TID) | INTRAVENOUS | Status: DC

## 2023-02-06 MED ORDER — MIDAZOLAM HCL 2 MG/2ML IJ SOLN
INTRAMUSCULAR | Status: AC
Start: 2023-02-06 — End: ?
  Filled 2023-02-06: qty 4

## 2023-02-06 MED ORDER — SODIUM CHLORIDE 0.9 % IV SOLN: INTRAVENOUS | Status: DC

## 2023-02-06 MED ORDER — LIDOCAINE HCL 1 % IJ SOLN
20.0000 mL | Freq: Once | INTRAMUSCULAR | Status: AC
  Administered 2023-02-06: 4 mL via INTRADERMAL

## 2023-02-06 MED ORDER — LIDOCAINE HCL 1 % IJ SOLN
INTRAMUSCULAR | Status: AC
  Filled 2023-02-06: qty 20

## 2023-02-06 NOTE — Discharge Instructions (Signed)
Please call Interventional Radiology clinic [phone] with any questions or concerns.    Suprapubic Catheter Home Guide A suprapubic catheter is a flexible tube that is used to drain urine from the bladder into a collection bag outside the body. The catheter is inserted into the bladder through a small opening in the lower abdomen, above the pubic bone (suprapubic area) and a few inches below your belly button (navel). A tiny balloon filled with germ-free (sterile) water helps to keep the catheter in place. The collection bag must be emptied at least once a day and cleaned at least every other day. The collection bag can be put beside your bed at night and attached to your leg during the day. You may have a large collection bag to use at night and a smaller one to use during the day. Your suprapubic catheter may need to be changed every 4-6 weeks, or as often as recommended by your health care provider. Healing of the tract where the catheter is placed can take 6 weeks to 6 months. During that time, your health care provider may change your catheter. Once the tract is well healed, you or a caregiver will change your suprapubic catheter at home. What are the risks? This catheter is safe to use. However, problems can occur, including: Blocked urine flow. This can occur if the catheter stops working, or if you have a blood clot in your bladder or in the catheter. Irritation of the skin around the catheter. Infection. This can happen if bacteria gets into your bladder. Supplies needed: Two pairs of sterile gloves. Paper towels. Catheter. Two syringes. Sterile water. Sterile cleaning solution. Lubricant. Collection bags. How to change the catheter  Drink plenty of fluids during the hours before you change the catheter. Wash your hands with soap and water. If soap and water are not available, use hand sanitizer. Draw up sterile water into a syringe to have ready to fill the new catheter  balloon. The amount will depend on the size of the balloon. Have all of your supplies ready and close to you on a paper towel. Lie on your back, sitting slightly upright so that you can see the catheter and opening. Put on sterile gloves. Clean the skin around the catheter opening using the sterile cleaning solution. Remove the water from the balloon in the catheter using a syringe. Slowly remove the catheter. If the catheter seems stuck, or if you have difficulty removing it: Do not pull on it. Call your health care provider right away. Place the old catheter on a paper towel to discard later. Take off the used gloves, and put on a new pair. Put lubricant on the end of the new catheter that will go into your bladder. Clean the skin around the catheter opening using the sterile cleaning solution. Gently slide the catheter through the opening in your abdomen and into the tract that leads to your bladder. Wait for some urine to start flowing through the catheter. When urine starts to flow through the catheter, attach the collection bag to the end of the catheter. Make sure the connection is tight. Use a syringe to fill the catheter balloon with sterile water. Fill to the amount directed by your health care provider. Remove the gloves and wash your hands with soap and water. How to care for the skin around the catheter  Follow your health care provider's instructions on caring for your skin. Use a clean washcloth and soapy water to clean the skin around  your catheter every day. Pat the area dry with a clean paper towel. Do not pull on the catheter. Do not use ointment or lotion on this area, unless told by your health care provider. Check the skin around the catheter every day for signs of infection. Check for: Redness, swelling, or pain. Fluid or blood. Warmth. Pus or a bad smell. How to empty and clean the collection bag Empty the large collection bag every 8 hours. Empty the small  collection bag when it is about ? full. Clean the collection bag every 2-3 days, or as often as told by your health care provider. To do this: Wash your hands with soap and water. If soap and water are not available, use hand sanitizer. Disconnect the bag from the catheter and immediately attach a new bag to the catheter. Hold the used bag over the toilet or another container. Turn the valve (spigot) at the bottom of the bag to empty the urine. Empty the used bag completely. Do not touch the opening of the spigot. Do not let the opening touch the toilet or container. Close the spigot tightly when the bag is empty. Clean the used bag in one of the following methods: According to the manufacturer's instructions. As told by your health care provider. Let the bag dry completely. Put it in a clean plastic bag before storing it. General tips  Always wash your hands before and after caring for your catheter and collection bag. Use a mild, fragrance-free soap. If soap and water are not available, use hand sanitizer. Clean the outside of the catheter with soap and water as often as told by your health care provider. Always make sure there are no twists or kinks in the catheter tube. Always make sure there are no leaks in the catheter or collection bag. Always wear the leg bag below your knee. Make sure the overnight drainage bag is always lower than the level of your bladder, but do not let it touch the floor. Before you go to sleep, hang the bag inside a wastebasket that is covered by a clean plastic bag. Drink enough fluid to keep your urine pale yellow. Do not take baths, swim, or use a hot tub until your health care provider approves. Ask your health care provider if you may take showers. Contact a heath care provider if: You leak urine. You have redness, swelling, or pain around your catheter. You have fluid or blood coming from your catheter opening. Your catheter opening feels warm to the  touch. You have pus or a bad smell coming from your catheter opening. You have a fever or chills. Your urine flow slows down. Your urine becomes cloudy or smelly. Get help right away if: Your catheter comes out. You have: Nausea. Back pain. Difficulty changing your catheter. Blood in your urine. No urine flow for 1 hour. Summary A suprapubic catheter is a flexible tube that is used to drain urine from the bladder into a collection bag outside the body. Your suprapubic catheter may need to be changed every 4-6 weeks, or as recommended by your health care provider. Follow instructions on how to change the catheter and how to empty and clean the collection bag. Always wash your hands before and after caring for your catheter and collection bag. Drink enough fluid to keep your urine pale yellow. Get help right away if you have difficulty changing your catheter or if there is blood in your urine. This information is not intended to replace  advice given to you by your health care provider. Make sure you discuss any questions you have with your health care provider. Document Revised: 05/08/2019 Document Reviewed: 04/03/2018 [employer] Patient Education  2023 [employer] Moderate Conscious Sedation-Care After  This sheet gives you information about how to care for yourself after your procedure. Your health care provider may also give you more specific instructions. If you have problems or questions, contact your health care provider.  After the procedure, it is common to have: Sleepiness for several hours. Impaired judgment for several hours. Difficulty with balance. Vomiting if you eat too soon.  Follow these instructions at home:  Rest. Do not participate in activities where you could fall or become injured. Do not drive or use machinery. Do not drink alcohol. Do not take sleeping pills or medicines that cause drowsiness. Do not make important decisions or sign legal documents. Do  not take care of children on your own.  Eating and drinking Follow the diet recommended by your health care provider. Drink enough fluid to keep your urine pale yellow. If you vomit: Drink water, juice, or soup when you can drink without vomiting. Make sure you have little or no nausea before eating solid foods.  General instructions Take over-the-counter and prescription medicines only as told by your health care provider. Have a responsible adult stay with you for the time you are told. It is important to have someone help care for you until you are awake and alert. Do not smoke. Keep all follow-up visits as told by your health care provider. This is important.  Contact a health care provider if: You are still sleepy or having trouble with balance after 24 hours. You feel light-headed. You keep feeling nauseous or you keep vomiting. You develop a rash. You have a fever. You have redness or swelling around the IV site.  Get help right away if: You have trouble breathing. You have new-onset confusion at home.  This information is not intended to replace advice given to you by your health care provider. Make sure you discuss any questions you have with your healthcare provider.

## 2023-02-06 NOTE — Procedures (Signed)
Vascular and Interventional Radiology Procedure Note  Patient: Vincent [name] [facility] DOB: 1966/03/01 Medical Record Number: [id number] Note Date/Time: 02/06/23 2:16 PM   Performing Physician: Roanna Banning, MD Assistant(s): None  Diagnosis: Routine exchange. and upsize to Council catheter  Procedure:  ABSCESS DRAIN EVALUATION and REMOVAL SUPRAPUBIC CYSTOSTOMY TUBE UPSIZE and EXCHANGE  Anesthesia: Conscious Sedation Complications: None Estimated Blood Loss: Minimal  Findings:  No residual abscess collection. Drain was removed. Successful exchange and upsize to a 53F Council suprapubic cystostomy tube under fluoroscopy.  Plan: Pt to follow up with Urology for routine SP catheter exchanges.   See detailed procedure note with images in PACS. The patient tolerated the procedure well without incident or complication and was returned to Recovery in stable condition.    Roanna Banning, MD [facility] Radiology   Pager. [phone] Clinic. [phone]

## 2023-03-24 ENCOUNTER — Encounter (HOSPITAL_COMMUNITY): Payer: Self-pay | Admitting: *Deleted

## 2023-03-24 ENCOUNTER — Other Ambulatory Visit: Payer: Self-pay

## 2023-03-24 ENCOUNTER — Emergency Department (HOSPITAL_COMMUNITY): Payer: 59

## 2023-03-24 ENCOUNTER — Observation Stay (HOSPITAL_COMMUNITY)
Admission: EM | Admit: 2023-03-24 | Discharge: 2023-03-25 | Disposition: A | Discharge status: Home / Self Care | Payer: 59 | Attending: Internal Medicine | Admitting: Internal Medicine

## 2023-03-24 DIAGNOSIS — Z8551 Personal history of malignant neoplasm of bladder: Secondary | ICD-10-CM | POA: Insufficient documentation

## 2023-03-24 DIAGNOSIS — M272 Inflammatory conditions of jaws: Secondary | ICD-10-CM | POA: Diagnosis present

## 2023-03-24 DIAGNOSIS — F17210 Nicotine dependence, cigarettes, uncomplicated: Secondary | ICD-10-CM | POA: Diagnosis not present

## 2023-03-24 DIAGNOSIS — Z794 Long term (current) use of insulin: Secondary | ICD-10-CM | POA: Diagnosis not present

## 2023-03-24 DIAGNOSIS — L0201 Cutaneous abscess of face: Secondary | ICD-10-CM | POA: Diagnosis not present

## 2023-03-24 DIAGNOSIS — I2510 Atherosclerotic heart disease of native coronary artery without angina pectoris: Secondary | ICD-10-CM | POA: Diagnosis not present

## 2023-03-24 DIAGNOSIS — Z7902 Long term (current) use of antithrombotics/antiplatelets: Secondary | ICD-10-CM | POA: Diagnosis not present

## 2023-03-24 DIAGNOSIS — N3001 Acute cystitis with hematuria: Secondary | ICD-10-CM

## 2023-03-24 DIAGNOSIS — E119 Type 2 diabetes mellitus without complications: Secondary | ICD-10-CM | POA: Insufficient documentation

## 2023-03-24 DIAGNOSIS — Z8673 Personal history of transient ischemic attack (TIA), and cerebral infarction without residual deficits: Secondary | ICD-10-CM | POA: Insufficient documentation

## 2023-03-24 DIAGNOSIS — Z79899 Other long term (current) drug therapy: Secondary | ICD-10-CM | POA: Diagnosis not present

## 2023-03-24 DIAGNOSIS — J449 Chronic obstructive pulmonary disease, unspecified: Secondary | ICD-10-CM | POA: Insufficient documentation

## 2023-03-24 DIAGNOSIS — E039 Hypothyroidism, unspecified: Secondary | ICD-10-CM | POA: Insufficient documentation

## 2023-03-24 LAB — COMPREHENSIVE METABOLIC PANEL
ALT: 9 U/L (ref 0–44)
AST: 9 U/L — ABNORMAL LOW (ref 15–41)
Albumin: 2.1 g/dL — ABNORMAL LOW (ref 3.5–5.0)
Alkaline Phosphatase: 84 U/L (ref 38–126)
Anion gap: 8 (ref 5–15)
BUN: 16 mg/dL (ref 6–20)
CO2: 22 mmol/L (ref 22–32)
Calcium: 8.1 mg/dL — ABNORMAL LOW (ref 8.9–10.3)
Chloride: 100 mmol/L (ref 98–111)
Creatinine, Ser: 0.97 mg/dL (ref 0.61–1.24)
GFR, Estimated: >60 mL/min (ref >60)
Glucose, Bld: 398 mg/dL — ABNORMAL HIGH (ref 70–99)
Potassium: 3.8 mmol/L (ref 3.5–5.1)
Sodium: 130 mmol/L — ABNORMAL LOW (ref 135–145)
Total Bilirubin: 0.5 mg/dL (ref 0.0–1.2)
Total Protein: 5.6 g/dL — ABNORMAL LOW (ref 6.5–8.1)

## 2023-03-24 LAB — CBC WITH DIFFERENTIAL/PLATELET
Abs Immature Granulocytes: 0.15 10*3/uL — ABNORMAL HIGH (ref 0.00–0.07)
Basophils Absolute: 0 10*3/uL (ref 0.0–0.1)
Basophils Relative: 0 %
Eosinophils Absolute: 0.1 10*3/uL (ref 0.0–0.5)
Eosinophils Relative: 1 %
HCT: 28.7 % — ABNORMAL LOW (ref 39.0–52.0)
Hemoglobin: 9.8 g/dL — ABNORMAL LOW (ref 13.0–17.0)
Immature Granulocytes: 1 %
Lymphocytes Relative: 13 %
Lymphs Abs: 1.6 10*3/uL (ref 0.7–4.0)
MCH: 26.6 pg (ref 26.0–34.0)
MCHC: 34.1 g/dL (ref 30.0–36.0)
MCV: 78 fL — ABNORMAL LOW (ref 80.0–100.0)
Monocytes Absolute: 0.7 10*3/uL (ref 0.1–1.0)
Monocytes Relative: 6 %
Neutro Abs: 10 10*3/uL — ABNORMAL HIGH (ref 1.7–7.7)
Neutrophils Relative %: 79 %
Platelets: 260 10*3/uL (ref 150–400)
RBC: 3.68 MIL/uL — ABNORMAL LOW (ref 4.22–5.81)
RDW: 12.6 % (ref 11.5–15.5)
WBC: 12.6 10*3/uL — ABNORMAL HIGH (ref 4.0–10.5)
nRBC: 0 % (ref 0.0–0.2)

## 2023-03-24 LAB — URINALYSIS, ROUTINE W REFLEX MICROSCOPIC
Bilirubin Urine: NEGATIVE
Glucose, UA: 50 mg/dL — AB
Ketones, ur: NEGATIVE mg/dL
Nitrite: NEGATIVE
Protein, ur: >=300 mg/dL — AB
RBC / HPF: >50 RBC/hpf (ref 0–5)
Specific Gravity, Urine: >1.046 — ABNORMAL HIGH (ref 1.005–1.030)
pH: 5 (ref 5.0–8.0)

## 2023-03-24 LAB — I-STAT CG4 LACTIC ACID, ED
Lactic Acid, Venous: 2 mmol/L (ref 0.5–1.9)
Lactic Acid, Venous: 1 mmol/L (ref 0.5–1.9)

## 2023-03-24 LAB — PROTIME-INR
INR: 1 (ref 0.8–1.2)
Prothrombin Time: 13.5 s (ref 11.4–15.2)

## 2023-03-24 LAB — CBG MONITORING, ED: Glucose-Capillary: 408 mg/dL — ABNORMAL HIGH (ref 70–99)

## 2023-03-24 MED ORDER — CLOPIDOGREL BISULFATE 75 MG PO TABS
75.0000 mg | ORAL_TABLET | Freq: Every day | ORAL | Status: DC
  Administered 2023-03-25: 75 mg via ORAL
  Filled 2023-03-24: qty 1

## 2023-03-24 MED ORDER — INSULIN GLARGINE-YFGN 100 UNIT/ML ~~LOC~~ SOLN
20.0000 [IU] | Freq: Every day | SUBCUTANEOUS | Status: DC
  Administered 2023-03-25: 20 [IU] via SUBCUTANEOUS
  Filled 2023-03-24 (×2): qty 0.2

## 2023-03-24 MED ORDER — ONDANSETRON HCL 4 MG/2ML IJ SOLN: 4.0000 mg | Freq: Four times a day (QID) | INTRAMUSCULAR | Status: DC | PRN

## 2023-03-24 MED ORDER — ONDANSETRON HCL 4 MG/2ML IJ SOLN
4.0000 mg | Freq: Once | INTRAMUSCULAR | Status: AC
  Administered 2023-03-24: 4 mg via INTRAVENOUS
  Filled 2023-03-24: qty 2

## 2023-03-24 MED ORDER — MORPHINE SULFATE (PF) 4 MG/ML IV SOLN
4.0000 mg | Freq: Once | INTRAVENOUS | Status: AC
  Administered 2023-03-24: 4 mg via INTRAVENOUS
  Filled 2023-03-24: qty 1

## 2023-03-24 MED ORDER — PANTOPRAZOLE SODIUM 40 MG PO TBEC
40.0000 mg | DELAYED_RELEASE_TABLET | Freq: Every day | ORAL | Status: DC
  Administered 2023-03-25: 40 mg via ORAL
  Filled 2023-03-24: qty 1

## 2023-03-24 MED ORDER — ROSUVASTATIN CALCIUM 20 MG PO TABS
40.0000 mg | ORAL_TABLET | Freq: Every day | ORAL | Status: DC
  Administered 2023-03-25: 40 mg via ORAL
  Filled 2023-03-24: qty 2

## 2023-03-24 MED ORDER — SODIUM CHLORIDE 0.9 % IV SOLN
1500.0000 mg | Freq: Once | INTRAVENOUS | Status: AC
  Administered 2023-03-24: 1500 mg via INTRAVENOUS
  Filled 2023-03-24: qty 30

## 2023-03-24 MED ORDER — NICOTINE 21 MG/24HR TD PT24
21.0000 mg | MEDICATED_PATCH | Freq: Every day | TRANSDERMAL | Status: DC
  Administered 2023-03-24 – 2023-03-25 (×2): 21 mg via TRANSDERMAL
  Filled 2023-03-24 (×2): qty 1

## 2023-03-24 MED ORDER — VENLAFAXINE HCL ER 75 MG PO CP24
150.0000 mg | ORAL_CAPSULE | Freq: Every day | ORAL | Status: DC
  Administered 2023-03-25: 150 mg via ORAL
  Filled 2023-03-24: qty 2

## 2023-03-24 MED ORDER — ONDANSETRON HCL 4 MG PO TABS: 4.0000 mg | ORAL_TABLET | Freq: Four times a day (QID) | ORAL | Status: DC | PRN

## 2023-03-24 MED ORDER — LACTATED RINGERS IV BOLUS
1000.0000 mL | Freq: Once | INTRAVENOUS | Status: AC
  Administered 2023-03-24: 1000 mL via INTRAVENOUS

## 2023-03-24 MED ORDER — INSULIN ASPART 100 UNIT/ML IJ SOLN
0.0000 [IU] | Freq: Three times a day (TID) | INTRAMUSCULAR | Status: DC
  Administered 2023-03-25: 15 [IU] via SUBCUTANEOUS
  Administered 2023-03-25: 5 [IU] via SUBCUTANEOUS

## 2023-03-24 MED ORDER — VANCOMYCIN HCL 1.5 G IV SOLR
1500.0000 mg | Freq: Once | INTRAVENOUS | Status: DC
  Filled 2023-03-24: qty 30

## 2023-03-24 MED ORDER — PREGABALIN 100 MG PO CAPS
200.0000 mg | ORAL_CAPSULE | Freq: Three times a day (TID) | ORAL | Status: DC
  Administered 2023-03-25 (×2): 200 mg via ORAL
  Filled 2023-03-24 (×2): qty 2

## 2023-03-24 MED ORDER — OXYCODONE HCL 5 MG PO TABS: 5.0000 mg | ORAL_TABLET | ORAL | Status: DC | PRN

## 2023-03-24 MED ORDER — INSULIN ASPART 100 UNIT/ML IJ SOLN: 0.0000 [IU] | Freq: Three times a day (TID) | INTRAMUSCULAR | Status: DC

## 2023-03-24 MED ORDER — AMLODIPINE BESYLATE 5 MG PO TABS
5.0000 mg | ORAL_TABLET | Freq: Every day | ORAL | Status: DC
  Administered 2023-03-25: 5 mg via ORAL
  Filled 2023-03-24: qty 1

## 2023-03-24 MED ORDER — LACTATED RINGERS IV SOLN
INTRAVENOUS | Status: DC
Start: 2023-03-24 — End: 2023-03-25

## 2023-03-24 MED ORDER — FENTANYL CITRATE PF 50 MCG/ML IJ SOSY
50.0000 ug | PREFILLED_SYRINGE | Freq: Once | INTRAMUSCULAR | Status: AC
  Administered 2023-03-24: 50 ug via INTRAVENOUS
  Filled 2023-03-24: qty 1

## 2023-03-24 MED ORDER — LIDOCAINE-EPINEPHRINE (PF) 2 %-1:200000 IJ SOLN
10.0000 mL | Freq: Once | INTRAMUSCULAR | Status: AC
  Administered 2023-03-24: 10 mL via INTRADERMAL
  Filled 2023-03-24: qty 20

## 2023-03-24 MED ORDER — SODIUM CHLORIDE 0.9 % IV SOLN
2.0000 g | Freq: Once | INTRAVENOUS | Status: AC
  Administered 2023-03-24: 2 g via INTRAVENOUS
  Filled 2023-03-24: qty 12.5

## 2023-03-24 MED ORDER — ENOXAPARIN SODIUM 40 MG/0.4ML IJ SOSY: 40.0000 mg | PREFILLED_SYRINGE | INTRAMUSCULAR | Status: DC

## 2023-03-24 MED ORDER — IOHEXOL 350 MG/ML SOLN
75.0000 mL | Freq: Once | INTRAVENOUS | Status: AC | PRN
  Administered 2023-03-24: 75 mL via INTRAVENOUS

## 2023-03-24 MED ORDER — ACETAMINOPHEN 325 MG PO TABS: 650.0000 mg | ORAL_TABLET | Freq: Four times a day (QID) | ORAL | Status: DC | PRN

## 2023-03-24 MED ORDER — ACETAMINOPHEN 650 MG RE SUPP: 650.0000 mg | Freq: Four times a day (QID) | RECTAL | Status: DC | PRN

## 2023-03-24 NOTE — ED Triage Notes (Signed)
 Pt here via [facility] from home for abscesses x several weeks, leaking catheter and swollen lower extremities.  Pt also states that 2 weeks ago he was walking and now he's so week he's unable to ambulate on his own.  VS stable. Pt ao x 4.  Purulent drainage in foley tubing.  Abscesses all over body.

## 2023-03-24 NOTE — ED Provider Notes (Signed)
 [place] [facility] Provider Note  CSN: [id number] Arrival date & time: 03/24/23 1436  Chief Complaint(s) Abscess and Hyperglycemia  HPI Vincent [name] is a 58 y.o. male with PMH CAD, COPD, GERD, recent hospital admission for intra-abdominal abscess in the setting of a suprapubic catheter with discharge on 01/25/2023 who presents emergency department for evaluation of facial pain and swelling as well as difficulty walking.  Patient states that over the last several weeks he has started develop worsening painful swelling on his face.  Also states that over the last 2 weeks he has gotten progressively weaker and he is now unable to ambulate.  Patient arrives with significant amount of sediment from his suprapubic catheter and he is unsure the last time it was changed.  Currently denies chest pain, shortness of breath, headache, fever or other systemic symptoms.   Past Medical History Past Medical History:  Diagnosis Date   Anxiety    Benign essential tremor    Bipolar 2 disorder [facility])    followed by [facility]--- dr s. arfeen   Bladder cancer [facility])    recurrent   CAD (coronary artery disease)    cardiac cath 2003  and 2011 both showed normal coronary arteries w/ preserved lvf;  Non obstructive on CTA Oct 2019.    Chronic pain syndrome    back---- followed [facility] [place]   Cold extremities    BLE   COPD (chronic obstructive pulmonary disease) (HCC)    DDD (degenerative disc disease), lumbar    Diabetic peripheral neuropathy (HCC)    Gastroparesis    followed by dr abran   GERD (gastroesophageal reflux disease)    Hiatal hernia    History of bladder cancer urologist-  previously dr liliana;  now dr gay   papillay TCC (Ta G1)  s/p TURBT and chemo instillation 2014   History of chest pain 12/2017   heart cath normal   History of encephalopathy 05/27/2015   admission w/ acute encephalopathy thought to be secondary to pain meds and COPD    History of gastric ulcer    History of Helicobacter pylori infection    History of kidney stones    History of TIA (transient ischemic attack) 2008  and 10-19-2018    no residual's   History of traumatic head injury 2010   w/ LOC  per pt needed stitches, hit in head with a mower blade   Hyperlipidemia    Hypertension    Hypogonadism male    s/p  bilateral orchiectomy   Hypothyroidism    Insomnia    Mild obstructive sleep apnea    study in epic 12-04-2016, no cpap   PTSD (post-traumatic stress disorder)    chronic   PTSD (post-traumatic stress disorder)    RA (rheumatoid arthritis) (HCC)    followed by [facility]   Seizures, transient [facility]) neurologist-  dr onita--  differential dx complex partial seizure .vs.  mood disorder .vs.  pseudoseizure--  negative EEG's   confusion episodes and staring spells since 11/ 2015   (03-26-2020 per pt wife last seizure 10 /2021)   Suicide attempt by drug overdose (HCC) 07/28/2021   Transient confusion NEUOROLOGIST-  DR ONITA   Episodes since 11/ 2015--  neurologist dx  differential complex partial seizure  .vs. mood disorder . vs. pseudoseizure   Type 2 diabetes mellitus treated with insulin  [facility])    endocrinologist--- dr kassie---  (03-26-2020 pt does not check blood sugar  at home)   Patient Active Problem List   Diagnosis Date Noted   Hx of abdominal abscess 01/18/2023   Sepsis with acute renal failure (HCC) 09/25/2022   Abscess, gluteal, right 09/25/2022   Peripheral neuropathy in hands 06/08/2022   Essential hypertension 05/23/2022   BPH (benign prostatic hyperplasia) 05/23/2022   AKI (acute kidney injury) (HCC) 09/04/2021   Hypotension 09/03/2021   History of seizure 08/02/2021   Cerebral thrombosis with cerebral infarction 11/16/2020   Ischemic stroke (HCC) 06/10/2020   Pain due to onychomycosis of toenails of both feet 01/28/2020   Hyperglycemia due to type 2 diabetes mellitus (HCC) 01/07/2020   Resistance to insulin   01/07/2020   Right leg weakness 04/24/2019   Right leg pain 04/24/2019   Gait abnormality 04/24/2019   TIA (transient ischemic attack) 10/20/2018   DM2 (diabetes mellitus, type 2) (HCC) 10/19/2018   Left sided numbness 10/19/2018   Diabetic autonomic neuropathy associated with type 2 diabetes mellitus (HCC) 02/01/2018   DM type 2 with diabetic peripheral neuropathy (HCC) 02/01/2018   Dyslipidemia 01/31/2018   Coronary artery disease involving native coronary artery of native heart without angina pectoris 01/31/2018   Chest pain 01/01/2018   Breakthrough seizure (HCC) 12/19/2016   Essential tremor 12/19/2016   Chronic post-traumatic stress disorder (PTSD) 06/16/2016   Tremor 06/16/2016   Tobacco abuse 05/05/2016   Altered mental status 05/28/2015   Type 2 diabetes mellitus with hyperglycemia (HCC) 05/28/2015   Hypothyroidism 05/28/2015   Bipolar 2 disorder (HCC) 05/28/2015   History of rheumatoid arthritis 05/28/2015   Chronic pain 05/28/2015   Bipolar II disorder, most recent episode major depressive (HCC) 03/18/2015   Carpal tunnel syndrome on left 02/10/2015   Carpal tunnel syndrome on right 02/10/2015   Diabetes (HCC) 01/23/2015   Obesity (BMI 30-39.9) 03/06/2014   Acute encephalopathy 03/05/2014   Hyperlipidemia 03/05/2014   Anemia, normocytic normochromic 03/05/2014   Altered mental state    Helicobacter pylori (H. pylori) infection 11/20/2012   Protein-calorie malnutrition, severe (HCC) 10/09/2012   Dysphagia 08/19/2012   Gastroparesis 08/19/2012   Bladder tumor 08/08/2012   Biliary dyskinesia 11/02/2010   Home Medication(s) Prior to Admission medications   Medication Sig Start Date End Date Taking? Authorizing Provider  amLODipine  (NORVASC ) 5 MG tablet Take 1 tablet (5 mg total) by mouth daily. 01/25/23   Gonfa, Taye T, MD  Cholecalciferol  (VITAMIN D3) 1000 units CAPS Take 1,000 Units by mouth in the morning.    [provider]  clopidogrel  (PLAVIX ) 75 MG  tablet Take 75 mg by mouth at bedtime.    [provider]  ezetimibe  (ZETIA ) 10 MG tablet Take 1 tablet (10 mg total) by mouth daily. Patient not taking: Reported on 01/18/2023 10/01/22   Dennise Lavada POUR, MD  hydrOXYzine  (ATARAX ) 25 MG tablet Take 25 mg by mouth at bedtime. 08/01/22   [provider]  insulin  aspart (NOVOLOG ) 100 UNIT/ML injection Inject 3 Units into the skin 3 (three) times daily with meals. 01/23/23   Samtani, Jai-Gurmukh, MD  insulin  glargine, 2 Unit Dial , (TOUJEO  MAX SOLOSTAR) 300 UNIT/ML Solostar Pen Inject 15 Units into the skin 2 (two) times daily at 8 am and 10 pm. 01/23/23   Samtani, Jai-Gurmukh, MD  nicotine  (NICODERM CQ  - DOSED IN MG/24 HOURS) 21 mg/24hr patch Place 1 patch (21 mg total) onto the skin daily. 08/30/22   Sherrill Cable Latif, DO  omeprazole  (PRILOSEC) 40 MG capsule Take 40 mg by mouth daily before breakfast. 08/23/21   [provider]  pregabalin  (LYRICA ) 200 MG capsule Take 1 capsule (200 mg total) by mouth 3 (three) times daily. 01/23/23   Samtani, Jai-Gurmukh, MD  rosuvastatin  (CRESTOR ) 40 MG tablet Take 40 mg by mouth at bedtime.    [provider]  sodium chloride  flush (NS) 0.9 % SOLN 5 mLs by Intracatheter route every 8 (eight) hours. 01/25/23   Gonfa, Taye T, MD  venlafaxine  XR (EFFEXOR -XR) 150 MG 24 hr capsule Take 1 capsule (150 mg total) by mouth daily with breakfast. 01/23/23   Samtani, Jai-Gurmukh, MD                                                                                                                                    Past Surgical History Past Surgical History:  Procedure Laterality Date   AMPUTATION Left 04/28/2020   Procedure: LEFT LITTLE FINGER AMPUTATION;  Surgeon: Harden Jerona GAILS, MD;  Location: [facility] OR;  Service: Orthopedics;  Laterality: Left;   CARDIAC CATHETERIZATION  12-27-2001  DR LADONA  &  05-26-2009  DR DANN   RESULTS FOR BOTH ARE NORMAL CORONARIES AND PERSERVED LVF/ EF 60%   CARPAL  TUNNEL RELEASE Bilateral right 09-16-2003;  left ?   CARPAL TUNNEL RELEASE Left 02/25/2015   Procedure: LEFT CARPAL TUNNEL RELEASE;  Surgeon: Franky Curia, MD;  Location: [facility];  Service: Orthopedics;  Laterality: Left;   CYSTOSCOPY N/A 10/10/2012   Procedure: CYSTOSCOPY CLOT EVACUATION FULGERATION OF BLEEDERS ;  Surgeon: Oneil JAYSON Rafter, MD;  Location: [facility];  Service: Urology;  Laterality: N/A;   CYSTOSCOPY WITH BIOPSY N/A 11/26/2015   Procedure: CYSTOSCOPY WITH BIOPSY AND FULGURATION;  Surgeon: Mark Ottelin, MD;  Location: [facility];  Service: Urology;  Laterality: N/A;   ESOPHAGOGASTRODUODENOSCOPY  2014   IR CATHETER TUBE CHANGE  02/06/2023   IR SINUS/FIST TUBE CHK-NON GI  02/06/2023   IR US  GUIDE BX ASP/DRAIN  01/19/2023   IRRIGATION AND DEBRIDEMENT ABSCESS Right 09/27/2022   Procedure: IRRIGATION AND DEBRIDEMENT OF RIGHT BUTTOCK  ABSCESS;  Surgeon: Curvin Deward MOULD, MD;  Location: [facility] OR;  Service: General;  Laterality: Right;   LAPAROSCOPIC CHOLECYSTECTOMY  11-17-2010   ORCHIECTOMY Right 02/21/2016   Procedure: SCROTAL ORCHIECTOMY with TESTICULAR PROSTHESIS IMPLANT;  Surgeon: Mark Ottelin, MD;  Location: [facility];  Service: Urology;  Laterality: Right;   ORCHIECTOMY Left 09/02/2018   Procedure: ORCHIECTOMY;  Surgeon: Rafter Oneil, MD;  Location[facility];  Service: Urology;  Laterality: Left;   ROTATOR CUFF REPAIR Right 12/2004   TRANSURETHRAL RESECTION OF BLADDER TUMOR N/A 08/09/2012   Procedure: TRANSURETHRAL RESECTION OF BLADDER TUMOR (TURBT) WITH GYRUS WITH MITOMYCIN  C;  Surgeon: Oneil JAYSON Rafter, MD;  Location: [facility];  Service: Urology;  Laterality: N/A;   TRANSURETHRAL RESECTION OF BLADDER TUMOR N/A 03/29/2020   Procedure: TRANSURETHRAL RESECTION OF BLADDER TUMOR (TURBT) and post-op instillation of  gemcitabine ;  Surgeon: Selma Donnice SAUNDERS, MD;  Location: [facility];   Service: Urology;  Laterality: N/A;   TRANSURETHRAL RESECTION OF BLADDER TUMOR WITH GYRUS (TURBT-GYRUS) N/A 02/27/2014   Procedure: TRANSURETHRAL RESECTION OF BLADDER TUMOR WITH GYRUS (TURBT-GYRUS);  Surgeon: Mark C Ottelin, MD;  Location: [facility];  Service: Urology;  Laterality: N/A;   Family History Family History  Problem Relation Age of Onset   Diabetes Mother    Diabetes Father    Hypertension Father    Heart attack Father 2       died age 87   Alcohol  abuse Father    Colon cancer Neg Hx    Esophageal cancer Neg Hx    Stomach cancer Neg Hx    Rectal cancer Neg Hx     Social History Social History   Tobacco Use   Smoking status: Every Day    Current packs/day: 1.50    Average packs/day: 1.5 packs/day for 38.0 years (57.0 ttl pk-yrs)    Types: Cigarettes   Smokeless tobacco: Never  Vaping Use   Vaping status: Never Used  Substance Use Topics   Alcohol  use: Yes    Comment: occ   Drug use: No   Allergies Celecoxib, Hydrocodone, and Sulfa antibiotics  Review of Systems Review of Systems  Constitutional:  Positive for fatigue.  HENT:  Positive for facial swelling.     Physical Exam Vital Signs  I have reviewed the triage vital signs BP (!) 110/55 (BP Location: Right Arm)   Pulse 80   Temp 97.8 F (36.6 C) (Oral)   Resp 16   Ht 6' (1.829 m)   Wt 80.7 kg   SpO2 100%   BMI 24.14 kg/m   Physical Exam Constitutional:      General: He is not in acute distress.    Appearance: Normal appearance.  HENT:     Head: Normocephalic and atraumatic.     Comments: Multiple left-sided facial abscesses    Nose: No congestion or rhinorrhea.  Eyes:     General:        Right eye: No discharge.        Left eye: No discharge.     Extraocular Movements: Extraocular movements intact.     Pupils: Pupils are equal, round, and reactive to light.  Cardiovascular:     Rate and Rhythm: Normal rate and regular rhythm.     Heart sounds: No murmur  heard. Pulmonary:     Effort: No respiratory distress.     Breath sounds: No wheezing or rales.  Abdominal:     General: There is no distension.     Tenderness: There is no abdominal tenderness.  Musculoskeletal:        General: Normal range of motion.     Cervical back: Normal range of motion.  Skin:    General: Skin is warm and dry.  Neurological:     General: No focal deficit present.     Mental Status: He is alert.     ED Results and Treatments Labs (all labs ordered are listed, but only abnormal results are displayed) Labs Reviewed  CBC WITH DIFFERENTIAL/PLATELET - Abnormal; Notable for the following components:      Result Value   WBC 12.6 (*)    RBC 3.68 (*)    Hemoglobin 9.8 (*)    HCT 28.7 (*)    MCV 78.0 (*)    Neutro Abs 10.0 (*)    Abs Immature Granulocytes  0.15 (*)    All other components within normal limits  CBG MONITORING, ED - Abnormal; Notable for the following components:   Glucose-Capillary 408 (*)    All other components within normal limits  I-STAT CG4 LACTIC ACID, ED - Abnormal; Notable for the following components:   Lactic Acid, Venous 2.0 (*)    All other components within normal limits  CULTURE, BLOOD (ROUTINE X 2)  CULTURE, BLOOD (ROUTINE X 2)  COMPREHENSIVE METABOLIC PANEL  PROTIME-INR  URINALYSIS, W/ REFLEX TO CULTURE (INFECTION SUSPECTED)                                                                                                                          Radiology DG Chest Portable 1 View Result Date: 03/24/2023 CLINICAL DATA:  Marjory Blase EXAM: PORTABLE CHEST 1 VIEW COMPARISON:  09/25/2022 FINDINGS: The heart size and mediastinal contours are within normal limits. Mildly coarsened interstitial markings. No focal airspace consolidation, pleural effusion, or pneumothorax. The visualized skeletal structures are unremarkable. IMPRESSION: Mildly coarsened interstitial markings, which may reflect bronchitic type lung changes. No focal  airspace consolidation. Electronically Signed   By: Mabel Converse D.O.   On: 03/24/2023 16:03    Pertinent labs & imaging results that were available during my care of the patient were reviewed by me and considered in my medical decision making (see MDM for details).  Medications Ordered in ED Medications  lactated ringers  bolus 1,000 mL (1,000 mLs Intravenous New Bag/Given 03/24/23 1602)                                                                                                                                     Procedures .Incision and Drainage  Date/Time: 03/24/2023 10:34 PM  Performed by: Albertina Dixon, MD Authorized by: Albertina Dixon, MD   Location:    Type:  Abscess   Size:  3.5   Location:  Head   Head location:  Face Sedation:    Sedation type:  None Anesthesia:    Anesthesia method:  Local infiltration   Local anesthetic:  Lidocaine  2% WITH epi Procedure type:    Complexity:  Simple Procedure details:    Incision types:  Single straight   Drainage:  Purulent   Drainage amount:  Copious Post-procedure details:    Procedure completion:  Tolerated well, no immediate complications .Incision and Drainage  Date/Time: 03/24/2023 10:38 PM  Performed  by: Albertina Dixon, MD Authorized by: Albertina Dixon, MD   Location:    Type:  Abscess   Size:  2   Location:  Head   Head location:  Face Pre-procedure details:    Skin preparation:  Chlorhexidine  with alcohol  Sedation:    Sedation type:  None Anesthesia:    Anesthesia method:  Local infiltration   Local anesthetic:  Lidocaine  2% WITH epi Procedure type:    Complexity:  Simple Procedure details:    Incision types:  Single straight   Wound management:  Probed and deloculated   Drainage:  Purulent   Drainage amount:  Scant   Wound treatment:  Wound left open Post-procedure details:    Procedure completion:  Tolerated well, no immediate complications .Incision and Drainage  Date/Time: 03/24/2023  10:40 PM  Performed by: Albertina Dixon, MD Authorized by: Albertina Dixon, MD   Location:    Type:  Abscess   Size:  1   Location:  Head   Head location:  Face Pre-procedure details:    Skin preparation:  Chlorhexidine  with alcohol  Sedation:    Sedation type:  None Anesthesia:    Anesthesia method:  Local infiltration   Local anesthetic:  Lidocaine  2% WITH epi Procedure type:    Complexity:  Simple Procedure details:    Incision types:  Single straight   Wound management:  Probed and deloculated   Drainage:  Purulent   Drainage amount:  Moderate Post-procedure details:    Procedure completion:  Tolerated well, no immediate complications SUPRAPUBIC TUBE PLACEMENT  Date/Time: 03/24/2023 10:56 PM  Performed by: Albertina Dixon, MD Authorized by: Albertina Dixon, MD   Consent:    Consent obtained:  Verbal Sedation:    Sedation type:  None Anesthesia:    Anesthesia method:  None Procedure details:    Complexity:  Simple   Catheter type:  Foley   Catheter size:  14 Fr   Ultrasound guidance: no     Number of attempts:  1   Urine characteristics:  Mildly cloudy Post-procedure details:    Procedure completion:  Tolerated well, no immediate complications   (including critical care time)  Medical Decision Making / ED Course   This patient presents to the ED for concern of facial pain, swelling, fatigue, this involves an extensive number of treatment options, and is a complaint that carries with it a high risk of complications and morbidity.  The differential diagnosis includes electrolyte abnormality, UTI, facial abscess, cellulitis, dehydration, bacteremia  MDM: Patient seen emergency room for evaluation of multiple complaints described above.  Physical exam with multiple abscesses over the left side of the face and along the angle of the mandible.  Laboratory evaluation with a leukocytosis of 12.6, hemoglobin 9.8, sodium 130, albumin 2.1.suprapubic catheter replaced at  bedside with nursing staff and new urinalysis concerning for infection.  CT imaging of the face concerning for multiple abscesses and given patient's previous history of MRSA patient started on bank, cefepime  started for patient's urine.  I performed multiple incision and drainages over 3 abscesses on the face leading to expression of a significant out of purulent fluid.  In the setting of facial cellulitis with history of MRSA, catheter associated UTI and inability to ambulate patient require hospital admission.  Patient admitted.   Additional history obtained:  -External records from outside source obtained and reviewed including: Chart review including previous notes, labs, imaging, consultation notes   Lab Tests: -I ordered, reviewed, and interpreted labs.   The pertinent results include:  Labs Reviewed  CBC WITH DIFFERENTIAL/PLATELET - Abnormal; Notable for the following components:      Result Value   WBC 12.6 (*)    RBC 3.68 (*)    Hemoglobin 9.8 (*)    HCT 28.7 (*)    MCV 78.0 (*)    Neutro Abs 10.0 (*)    Abs Immature Granulocytes 0.15 (*)    All other components within normal limits  CBG MONITORING, ED - Abnormal; Notable for the following components:   Glucose-Capillary 408 (*)    All other components within normal limits  I-STAT CG4 LACTIC ACID, ED - Abnormal; Notable for the following components:   Lactic Acid, Venous 2.0 (*)    All other components within normal limits  CULTURE, BLOOD (ROUTINE X 2)  CULTURE, BLOOD (ROUTINE X 2)  COMPREHENSIVE METABOLIC PANEL  PROTIME-INR  URINALYSIS, W/ REFLEX TO CULTURE (INFECTION SUSPECTED)      EKG   EKG Interpretation Date/Time:  Saturday March 24 2023 15:59:13 EST Ventricular Rate:  78 PR Interval:  43 QRS Duration:  89 QT Interval:  388 QTC Calculation: 442 R Axis:   62  Text Interpretation: Sinus rhythm Short PR interval Probable left atrial enlargement Confirmed by Kingsly Kloepfer ([id number]) on 03/24/2023 10:59:03 PM          Imaging Studies ordered: I ordered imaging studies including chest x-ray, CT Max face I independently visualized and interpreted imaging. I agree with the radiologist interpretation   Medicines ordered and prescription drug management: Meds ordered this encounter  Medications   lactated ringers  bolus 1,000 mL    -I have reviewed the patients home medicines and have made adjustments as needed  Critical interventions none    Cardiac Monitoring: The patient was maintained on a cardiac monitor.  I personally viewed and interpreted the cardiac monitored which showed an underlying rhythm of: NSR  Social Determinants of Health:  Factors impacting patients care include: none   Reevaluation: After the interventions noted above, I reevaluated the patient and found that they have :improved  Co morbidities that complicate the patient evaluation  Past Medical History:  Diagnosis Date   Anxiety    Benign essential tremor    Bipolar 2 disorder (HCC)    followed by [facility]--- dr s. arfeen   Bladder cancer [facility])    recurrent   CAD (coronary artery disease)    cardiac cath 2003  and 2011 both showed normal coronary arteries w/ preserved lvf;  Non obstructive on CTA Oct 2019.    Chronic pain syndrome    back---- [place] [facility] [place]   Cold extremities    BLE   COPD (chronic obstructive pulmonary disease) (HCC)    DDD (degenerative disc disease), lumbar    Diabetic peripheral neuropathy (HCC)    Gastroparesis    followed by dr abran   GERD (gastroesophageal reflux disease)    Hiatal hernia    History of bladder cancer urologist-  previously dr liliana;  now dr gay   papillay TCC (Ta G1)  s/p TURBT and chemo instillation 2014   History of chest pain 12/2017   heart cath normal   History of encephalopathy 05/27/2015   admission w/ acute encephalopathy thought to be secondary to pain meds and COPD   History of gastric ulcer    History of Helicobacter  pylori infection    History of kidney stones    History of TIA (transient ischemic attack) 2008  and 10-19-2018    no  residual's   History of traumatic head injury 2010   w/ LOC  per pt needed stitches, hit in head with a mower blade   Hyperlipidemia    Hypertension    Hypogonadism male    s/p  bilateral orchiectomy   Hypothyroidism    Insomnia    Mild obstructive sleep apnea    study in epic 12-04-2016, no cpap   PTSD (post-traumatic stress disorder)    chronic   PTSD (post-traumatic stress disorder)    RA (rheumatoid arthritis) (HCC)    followed by [facility]   Seizures, transient [facility]) neurologist-  dr onita--  differential dx complex partial seizure .vs.  mood disorder .vs.  pseudoseizure--  negative EEG's   confusion episodes and staring spells since 11/ 2015   (03-26-2020 per pt wife last seizure 10 /2021)   Suicide attempt by drug overdose (HCC) 07/28/2021   Transient confusion NEUOROLOGIST-  DR ONITA   Episodes since 11/ 2015--  neurologist dx  differential complex partial seizure  .vs. mood disorder . vs. pseudoseizure   Type 2 diabetes mellitus treated with insulin  [facility])    endocrinologist--- dr kassie---  (03-26-2020 pt does not check blood sugar at home)      Dispostion: I considered admission for this patient, and due to catheter associated UTI, inability to walk, need for IV antibiotics patient require hospital admission     Final Clinical Impression(s) / ED Diagnoses Final diagnoses:  None     @PCDICTATION @    Albertina Dixon, MD 03/25/23 1250

## 2023-03-24 NOTE — Progress Notes (Addendum)
 ED Pharmacy Antibiotic Sign Off An antibiotic consult was received from an ED provider for vancomycin  per pharmacy dosing for cellulitis, and also cefepime  for UTI concern. A chart review was completed to assess appropriateness.   The following one time order(s) were placed:  Vancomycin  1500 mg IV x 1 Cefepime  2g IV x 1  Further antibiotic and/or antibiotic pharmacy consults should be ordered by the admitting provider if indicated.   Thank you for allowing pharmacy to be a part of this patient's care.   Dorn Poot, [facility]  Clinical Pharmacist 03/24/23 7:25 PM

## 2023-03-24 NOTE — H&P (Signed)
 History and Physical    Vincent [name] FMW:[id number] DOB: 04-17-1965 DOA: 03/24/2023  PCP: Burney Darice CROME, MD   Chief Complaint: abscess  HPI: Vincent [name] is a 58 y.o. male with medical history significant of CAD, COPD, GERD who presented to the emergency department due to facial pain.  Patient developed an abscess on the left side of his face that he states was pushing on his nasal cavity and was unable to open his mouth.  He presents emergency department for further assistance.  On arrival he was afebrile and hemodynamically stable.  Labs were obtained which demonstrated glucose 408, sodium 130, creatinine 0.97, WBC 12.6, hemoglobin 9.8, INR 1.0, lactic acid 2.0, urinalysis concerning for infection in setting of suprapubic catheter.  Patient had chest x-ray which showed no acute findings.  CT face demonstrated multiple abscesses of the left face measuring up to 3.6 cm.  Patient underwent incision and drainage in the emergency department was started on vancomycin  and cefepime .  On evaluation he states that since receiving fluids antibiotics his weakness and pain in his face is much improved.  He endorsed objective fever and chills at home.  He states his legs are weaker since the infection he has been having difficulty walking and take care of himself.  He was admitted for further assessment.   Review of Systems: Review of Systems  Constitutional:  Positive for chills, fever and malaise/fatigue.  HENT: Negative.    Eyes: Negative.   Respiratory: Negative.    Cardiovascular: Negative.   Gastrointestinal: Negative.   Genitourinary: Negative.   Musculoskeletal: Negative.   Skin: Negative.   Neurological: Negative.   Endo/Heme/Allergies: Negative.   Psychiatric/Behavioral: Negative.    All other systems reviewed and are negative.    As per HPI otherwise 10 point review of systems negative.   Allergies  Allergen Reactions   Celecoxib Anaphylaxis, Swelling, Rash and Other (See  Comments)    Tongue swells   Hydrocodone Rash and Other (See Comments)    Blisters developed on arms; tolerated Norco okay (per sister)    Sulfa Antibiotics Rash    Past Medical History:  Diagnosis Date   Anxiety    Benign essential tremor    Bipolar 2 disorder (HCC)    followed by [facility]--- dr s. arfeen   Bladder cancer [facility])    recurrent   CAD (coronary artery disease)    cardiac cath 2003  and 2011 both showed normal coronary arteries w/ preserved lvf;  Non obstructive on CTA Oct 2019.    Chronic pain syndrome    back---- followed by [facility] in [place]   Cold extremities    BLE   COPD (chronic obstructive pulmonary disease) (HCC)    DDD (degenerative disc disease), lumbar    Diabetic peripheral neuropathy (HCC)    Gastroparesis    followed by dr abran   GERD (gastroesophageal reflux disease)    Hiatal hernia    History of bladder cancer urologist-  previously dr liliana;  now dr gay   papillay TCC (Ta G1)  s/p TURBT and chemo instillation 2014   History of chest pain 12/2017   heart cath normal   History of encephalopathy 05/27/2015   admission w/ acute encephalopathy thought to be secondary to pain meds and COPD   History of gastric ulcer    History of Helicobacter pylori infection    History of kidney stones    History of TIA (transient ischemic attack) 2008  and 10-19-2018  no residual's   History of traumatic head injury 2010   w/ LOC  per pt needed stitches, hit in head with a mower blade   Hyperlipidemia    Hypertension    Hypogonadism male    s/p  bilateral orchiectomy   Hypothyroidism    Insomnia    Mild obstructive sleep apnea    study in epic 12-04-2016, no cpap   PTSD (post-traumatic stress disorder)    chronic   PTSD (post-traumatic stress disorder)    RA (rheumatoid arthritis) (HCC)    followed by [facility]   Seizures, transient [facility]) neurologist-  dr onita--  differential dx complex partial seizure .vs.  mood disorder .vs.   pseudoseizure--  negative EEG's   confusion episodes and staring spells since 11/ 2015   (03-26-2020 per pt wife last seizure 10 /2021)   Suicide attempt by drug overdose (HCC) 07/28/2021   Transient confusion NEUOROLOGIST-  DR ONITA   Episodes since 11/ 2015--  neurologist dx  differential complex partial seizure  .vs. mood disorder . vs. pseudoseizure   Type 2 diabetes mellitus treated with insulin  [facility])    endocrinologist--- dr kassie---  (03-26-2020 pt does not check blood sugar at home)    Past Surgical History:  Procedure Laterality Date   AMPUTATION Left 04/28/2020   Procedure: LEFT LITTLE FINGER AMPUTATION;  Surgeon: Harden Jerona GAILS, MD;  Location[facility] OR;  Service: Orthopedics;  Laterality: Left;   CARDIAC CATHETERIZATION  12-27-2001  DR LADONA  &  05-26-2009  DR DANN   RESULTS FOR BOTH ARE NORMAL CORONARIES AND PERSERVED LVF/ EF 60%   CARPAL TUNNEL RELEASE Bilateral right 09-16-2003;  left ?   CARPAL TUNNEL RELEASE Left 02/25/2015   Procedure: LEFT CARPAL TUNNEL RELEASE;  Surgeon: Franky Curia, MD;  Location: [facility];  Service: Orthopedics;  Laterality: Left;   CYSTOSCOPY N/A 10/10/2012   Procedure: CYSTOSCOPY CLOT EVACUATION FULGERATION OF BLEEDERS ;  Surgeon: Oneil JAYSON Rafter, MD;  Location: [facility];  Service: Urology;  Laterality: N/A;   CYSTOSCOPY WITH BIOPSY N/A 11/26/2015   Procedure: CYSTOSCOPY WITH BIOPSY AND FULGURATION;  Surgeon: Mark Ottelin, MD;  Location: [facility];  Service: Urology;  Laterality: N/A;   ESOPHAGOGASTRODUODENOSCOPY  2014   IR CATHETER TUBE CHANGE  02/06/2023   IR SINUS/FIST TUBE CHK-NON GI  02/06/2023   IR US  GUIDE BX ASP/DRAIN  01/19/2023   IRRIGATION AND DEBRIDEMENT ABSCESS Right 09/27/2022   Procedure: IRRIGATION AND DEBRIDEMENT OF RIGHT BUTTOCK  ABSCESS;  Surgeon: Curvin Deward MOULD, MD;  Location: [facility] OR;  Service: General;  Laterality: Right;   LAPAROSCOPIC CHOLECYSTECTOMY  11-17-2010   ORCHIECTOMY  Right 02/21/2016   Procedure: SCROTAL ORCHIECTOMY with TESTICULAR PROSTHESIS IMPLANT;  Surgeon: Mark Ottelin, MD;  Location: [facility];  Service: Urology;  Laterality: Right;   ORCHIECTOMY Left 09/02/2018   Procedure: ORCHIECTOMY;  Surgeon: Rafter Oneil, MD;  Location: [facility];  Service: Urology;  Laterality: Left;   ROTATOR CUFF REPAIR Right 12/2004   TRANSURETHRAL RESECTION OF BLADDER TUMOR N/A 08/09/2012   Procedure: TRANSURETHRAL RESECTION OF BLADDER TUMOR (TURBT) WITH GYRUS WITH MITOMYCIN  C;  Surgeon: Oneil JAYSON Rafter, MD;  Location[facility];  Service: Urology;  Laterality: N/A;   TRANSURETHRAL RESECTION OF BLADDER TUMOR N/A 03/29/2020   Procedure: TRANSURETHRAL RESECTION OF BLADDER TUMOR (TURBT) and post-op instillation of gemcitabine ;  Surgeon: Selma Donnice SAUNDERS, MD;  Location: [facility];  Service: Urology;  Laterality: N/A;   TRANSURETHRAL RESECTION OF BLADDER TUMOR WITH GYRUS (TURBT-GYRUS) N/A 02/27/2014   Procedure: TRANSURETHRAL RESECTION OF BLADDER TUMOR WITH GYRUS (TURBT-GYRUS);  Surgeon: Mark C Ottelin, MD;  Location: [facility];  Service: Urology;  Laterality: N/A;     reports that he has been smoking cigarettes. He has a 57 pack-year smoking history. He has never used smokeless tobacco. He reports current alcohol  use. He reports that he does not use drugs.  Family History  Problem Relation Age of Onset   Diabetes Mother    Diabetes Father    Hypertension Father    Heart attack Father 36       died age 51   Alcohol  abuse Father    Colon cancer Neg Hx    Esophageal cancer Neg Hx    Stomach cancer Neg Hx    Rectal cancer Neg Hx     Prior to Admission medications   Medication Sig Start Date End Date Taking? Authorizing Provider  amLODipine  (NORVASC ) 5 MG tablet Take 1 tablet (5 mg total) by mouth daily. 01/25/23   Gonfa, Taye T, MD  Cholecalciferol  (VITAMIN D3) 1000 units CAPS Take 1,000 Units  by mouth in the morning.    [provider]  clopidogrel  (PLAVIX ) 75 MG tablet Take 75 mg by mouth at bedtime.    [provider]  ezetimibe  (ZETIA ) 10 MG tablet Take 1 tablet (10 mg total) by mouth daily. Patient not taking: Reported on 01/18/2023 10/01/22   Dennise Lavada POUR, MD  hydrOXYzine  (ATARAX ) 25 MG tablet Take 25 mg by mouth at bedtime. 08/01/22   [provider]  insulin  aspart (NOVOLOG ) 100 UNIT/ML injection Inject 3 Units into the skin 3 (three) times daily with meals. 01/23/23   Samtani, Jai-Gurmukh, MD  insulin  glargine, 2 Unit Dial , (TOUJEO  MAX SOLOSTAR) 300 UNIT/ML Solostar Pen Inject 15 Units into the skin 2 (two) times daily at 8 am and 10 pm. 01/23/23   Samtani, Jai-Gurmukh, MD  nicotine  (NICODERM CQ  - DOSED IN MG/24 HOURS) 21 mg/24hr patch Place 1 patch (21 mg total) onto the skin daily. 08/30/22   Sherrill Cable Latif, DO  omeprazole  (PRILOSEC) 40 MG capsule Take 40 mg by mouth daily before breakfast. 08/23/21   [provider]  pregabalin  (LYRICA ) 200 MG capsule Take 1 capsule (200 mg total) by mouth 3 (three) times daily. 01/23/23   Samtani, Jai-Gurmukh, MD  rosuvastatin  (CRESTOR ) 40 MG tablet Take 40 mg by mouth at bedtime.    [provider]  sodium chloride  flush (NS) 0.9 % SOLN 5 mLs by Intracatheter route every 8 (eight) hours. 01/25/23   Gonfa, Taye T, MD  venlafaxine  XR (EFFEXOR -XR) 150 MG 24 hr capsule Take 1 capsule (150 mg total) by mouth daily with breakfast. 01/23/23   Royal Sill, MD    Physical Exam: Vitals:   03/24/23 2115 03/24/23 2130 03/24/23 2145 03/24/23 2200  BP: 127/66 93/71 (!) 118/59 (!) 126/59  Pulse: 78 76 76 80  Resp: 16 15 17 19   Temp:      TempSrc:      SpO2: 100% 99% 99% 100%  Weight:      Height:       Physical Exam Constitutional:      Appearance: He is normal weight.  HENT:     Head: Normocephalic.     Nose: Nose normal.     Mouth/Throat:     Mouth: Mucous membranes are moist.      Pharynx: Oropharynx  is clear.  Eyes:     Conjunctiva/sclera: Conjunctivae normal.     Pupils: Pupils are equal, round, and reactive to light.  Cardiovascular:     Rate and Rhythm: Normal rate and regular rhythm.     Pulses: Normal pulses.  Pulmonary:     Effort: Pulmonary effort is normal.  Abdominal:     General: Abdomen is flat.     Palpations: Abdomen is soft.  Musculoskeletal:        General: Normal range of motion.     Cervical back: Normal range of motion.  Skin:    General: Skin is warm and dry.  Neurological:     General: No focal deficit present.     Mental Status: He is alert.  Psychiatric:        Mood and Affect: Mood normal.        Labs on Admission: I have personally reviewed the patients's labs and imaging studies.  Assessment/Plan Principal Problem:   Facial abscess   # Sepsis secondary to facial abscess - Patient has poorly controlled diabetes - Patient found to have lactic acidosis 2.0 - Underwent vision and drainage emergency department with initiation of vancomycin  and cefepime   Plan: Continue vancomycin  and cefepime  Continue IV fluids Check labs in morning  # Chronic pain-continue gabapentin   # Poorly controlled type 2 diabetes-placed on basal bolus regimen  # Hyperlipidemia-continue Crestor   # Depression-continue venlafaxine   # GERD-continue pantoprazole   # CAD-continue aspirin   # Hypertension-continue amlodipine   # Chronic urinary retention status post suprapubic Foley catheter.  Catheter was exchanged.  Will continue antibiotics  # Hyponatremia-continue volume resuscitation  # Chronic anemia-trend hemoglobin    Admission status: Inpatient Med-Surg  Certification: The appropriate patient status for this patient is INPATIENT. Inpatient status is judged to be reasonable and necessary in order to provide the required intensity of service to ensure the patient's safety. The patient's presenting symptoms, physical exam findings,  and initial radiographic and laboratory data in the context of their chronic comorbidities is felt to place them at high risk for further clinical deterioration. Furthermore, it is not anticipated that the patient will be medically stable for discharge from the hospital within 2 midnights of admission.   * I certify that at the point of admission it is my clinical judgment that the patient will require inpatient hospital care spanning beyond 2 midnights from the point of admission due to high intensity of service, high risk for further deterioration and high frequency of surveillance required.DEWAINE Lamar Dess MD [facility] If 7PM-7AM, please contact night-coverage [email or web address]  03/24/2023, 10:24 PM

## 2023-03-25 DIAGNOSIS — L0201 Cutaneous abscess of face: Secondary | ICD-10-CM | POA: Diagnosis not present

## 2023-03-25 DIAGNOSIS — M272 Inflammatory conditions of jaws: Secondary | ICD-10-CM | POA: Diagnosis present

## 2023-03-25 LAB — CBC
HCT: 24.3 % — ABNORMAL LOW (ref 39.0–52.0)
Hemoglobin: 8.2 g/dL — ABNORMAL LOW (ref 13.0–17.0)
MCH: 26.5 pg (ref 26.0–34.0)
MCHC: 33.7 g/dL (ref 30.0–36.0)
MCV: 78.6 fL — ABNORMAL LOW (ref 80.0–100.0)
Platelets: 207 10*3/uL (ref 150–400)
RBC: 3.09 MIL/uL — ABNORMAL LOW (ref 4.22–5.81)
RDW: 12.7 % (ref 11.5–15.5)
WBC: 10.3 10*3/uL (ref 4.0–10.5)
nRBC: 0 % (ref 0.0–0.2)

## 2023-03-25 LAB — CBG MONITORING, ED
Glucose-Capillary: 396 mg/dL — ABNORMAL HIGH (ref 70–99)
Glucose-Capillary: 231 mg/dL — ABNORMAL HIGH (ref 70–99)

## 2023-03-25 LAB — BASIC METABOLIC PANEL
Anion gap: 8 (ref 5–15)
BUN: 16 mg/dL (ref 6–20)
CO2: 20 mmol/L — ABNORMAL LOW (ref 22–32)
Calcium: 7.8 mg/dL — ABNORMAL LOW (ref 8.9–10.3)
Chloride: 105 mmol/L (ref 98–111)
Creatinine, Ser: 0.99 mg/dL (ref 0.61–1.24)
GFR, Estimated: >60 mL/min (ref >60)
Glucose, Bld: 363 mg/dL — ABNORMAL HIGH (ref 70–99)
Potassium: 3.8 mmol/L (ref 3.5–5.1)
Sodium: 133 mmol/L — ABNORMAL LOW (ref 135–145)

## 2023-03-25 MED ORDER — INSULIN ASPART 100 UNIT/ML IJ SOLN: 20.0000 [IU] | Freq: Once | INTRAMUSCULAR | Status: DC

## 2023-03-25 MED ORDER — TOUJEO MAX SOLOSTAR 300 UNIT/ML ~~LOC~~ SOPN: 25.0000 [IU] | PEN_INJECTOR | Freq: Two times a day (BID) | SUBCUTANEOUS | 0 refills | Status: DC

## 2023-03-25 MED ORDER — SODIUM CHLORIDE 0.9 % IV SOLN
2.0000 g | Freq: Three times a day (TID) | INTRAVENOUS | Status: DC
  Administered 2023-03-25: 2 g via INTRAVENOUS
  Filled 2023-03-25: qty 12.5

## 2023-03-25 MED ORDER — VANCOMYCIN HCL 1250 MG/250ML IV SOLN
1250.0000 mg | Freq: Two times a day (BID) | INTRAVENOUS | Status: DC
  Administered 2023-03-25: 1250 mg via INTRAVENOUS
  Filled 2023-03-25 (×2): qty 250

## 2023-03-25 MED ORDER — DOXYCYCLINE HYCLATE 50 MG PO CAPS: 50.0000 mg | ORAL_CAPSULE | Freq: Two times a day (BID) | ORAL | 0 refills | Status: AC

## 2023-03-25 NOTE — Progress Notes (Signed)
 Pharmacy Antibiotic Note  Vincent [name] is a 58 y.o. male admitted on 03/24/2023 with  facial abscess .  Pharmacy has been consulted for Vancomycin /Cefepime  dosing. WBC mildly elevated. Renal function good.   Plan: Vancomycin  1250 mg IV q12h >>>Estimated AUC: 518 Cefepime  2g IV q8h Trend WBC, temp, renal function  F/U infectious work-up Drug levels as indicated   Height: 6' (182.9 cm) Weight: 80.7 kg (178 lb) IBW/kg (Calculated) : 77.6  Temp (24hrs), Avg:98.3 F (36.8 C), Min:97.8 F (36.6 C), Max:99 F (37.2 C)  Recent Labs  Lab 03/24/23 1540 03/24/23 1549 03/24/23 1815  WBC 12.6*  --   --   CREATININE 0.97  --   --   LATICACIDVEN  --  2.0* 1.0    Estimated Creatinine Clearance: 94.4 mL/min (by C-G formula based on SCr of 0.97 mg/dL).    Allergies  Allergen Reactions   Celecoxib Anaphylaxis, Swelling, Rash and Other (See Comments)    Tongue swells   Hydrocodone Rash and Other (See Comments)    Blisters developed on arms; tolerated Norco okay (per sister)    Sulfa Antibiotics Rash   Lynwood Mckusick, PharmD, BCPS Clinical Pharmacist Phone: [phone]

## 2023-03-25 NOTE — Discharge Summary (Signed)
 Physician Discharge Summary  Vincent [name] FMW:[id number] DOB: 28-Dec-1965 DOA: 03/24/2023  PCP: Burney Darice CROME, MD  Admit date: 03/24/2023 Discharge date: 03/25/2023  Admitted From: Home Disposition: Home  Recommendations for Outpatient Follow-up:  Follow up with PCP in 1-2 weeks  Home Health: PT OT social work engineer, civil (consulting) and aide Equipment/Devices: None  Discharge Condition: Stable CODE STATUS: Full Diet recommendation: Low-salt low-fat low-carb diet  Brief/Interim Summary: Vincent [name] is a 58 y.o. male with medical history significant of CAD, COPD, GERD with history of abscess presented to the emergency department due to facial pain worsening over the past week.  Patient admitted as above with acutely worsening facial pain, noted abscess over the left lateral jaw having I&D performed in the ED with markedly improving pain and normalizing labs he is otherwise stable and agreeable for discharge home, transition to p.o. antibiotics given normalization of lactic acid with no fever or other infectious etiology, patient does not meet sepsis criteria at this time only SIRS criteria met at intake was mild leukocytosis.  At this time he is stable for discharge, close follow-up with outpatient PCP and to continue wound care as discussed. *Medication changes as below including increased insulin  given profoundly uncontrolled diabetes with moderate hyperglycemia here at intake.  Continue to follow glucose checks at home and follow-up with PCP for further insulin  adjustment as appropriate.    Discharge Diagnoses:  Principal Problem:   Facial abscess Active Problems:   Abscess, jaw    Discharge Instructions  Discharge Instructions     Call MD for:  difficulty breathing, headache or visual disturbances   Complete by: As directed    Call MD for:  extreme fatigue   Complete by: As directed    Call MD for:  hives   Complete by: As directed    Call MD for:  persistant dizziness or  light-headedness   Complete by: As directed    Call MD for:  persistant nausea and vomiting   Complete by: As directed    Call MD for:  severe uncontrolled pain   Complete by: As directed    Call MD for:  temperature >100.4   Complete by: As directed    Diet Carb Modified   Complete by: As directed    Increase activity slowly   Complete by: As directed       Allergies as of 03/25/2023       Reactions   Celecoxib Anaphylaxis, Swelling, Rash, Other (See Comments)   Tongue swells   Sucralfate  Anaphylaxis, Rash, Other (See Comments)   Broke out in a rash with hives that left scars on his legs   Hydrocodone Rash, Other (See Comments)   Blisters developed on arms; tolerated Norco okay (per sister)   Sulfa Antibiotics Rash        Medication List     STOP taking these medications    clopidogrel  75 MG tablet Commonly known as: PLAVIX        TAKE these medications    acetaminophen  500 MG tablet Commonly known as: TYLENOL  Take 1,000 mg by mouth every 6 (six) hours as needed for mild pain (pain score 1-3) or moderate pain (pain score 4-6).   amLODipine  10 MG tablet Commonly known as: NORVASC  Take 10 mg by mouth daily.   doxycycline  50 MG capsule Commonly known as: VIBRAMYCIN  Take 1 capsule (50 mg total) by mouth 2 (two) times daily for 14 days.   hydroxychloroquine  200 MG tablet Commonly known as: PLAQUENIL  Take 200  mg by mouth 2 (two) times daily.   hydrOXYzine  25 MG tablet Commonly known as: ATARAX  Take 25 mg by mouth at bedtime.   insulin  aspart 100 UNIT/ML injection Commonly known as: novoLOG  Inject 3 Units into the skin 3 (three) times daily with meals. What changed: how much to take   lurasidone  40 MG Tabs tablet Commonly known as: LATUDA  Take 40 mg by mouth daily with breakfast.   omeprazole  40 MG capsule Commonly known as: PRILOSEC Take 40 mg by mouth daily before breakfast.   pregabalin  200 MG capsule Commonly known as: LYRICA  Take 1 capsule  (200 mg total) by mouth 3 (three) times daily.   Prenatal 27-1 MG Tabs Take 1 tablet by mouth daily.   rOPINIRole  0.5 MG tablet Commonly known as: REQUIP  Take 0.5 mg by mouth.   rosuvastatin  10 MG tablet Commonly known as: CRESTOR  Take 10 mg by mouth daily.   sodium chloride  flush 0.9 % Soln Commonly known as: NS 5 mLs by Intracatheter route every 8 (eight) hours.   Toujeo  Max SoloStar 300 UNIT/ML Solostar Pen Generic drug: insulin  glargine (2 Unit Dial ) Inject 25 Units into the skin 2 (two) times daily at 8 am and 10 pm. What changed: how much to take   traZODone  50 MG tablet Commonly known as: DESYREL  Take 50 mg by mouth at bedtime.   venlafaxine  XR 150 MG 24 hr capsule Commonly known as: EFFEXOR -XR Take 1 capsule (150 mg total) by mouth daily with breakfast.   Vitamin D3 1000 units Caps Take 1,000 Units by mouth in the morning.        Follow-up Information     Burney Darice CROME, MD Follow up.   Specialty: Family Medicine Why: Please make an appointment ASAP, they will need primary care dignoff for Home health Contact information: [address] [place] [address] [phone]         [facility] Follow up.   Why: They will call you to set up services may be a slight delay but you can call [facility] [phone] for any questions home health PT OT Nursing and social work Solicitor information: [address] [place] [address] [phone]                Allergies  Allergen Reactions   Celecoxib Anaphylaxis, Swelling, Rash and Other (See Comments)    Tongue swells   Sucralfate  Anaphylaxis, Rash and Other (See Comments)    Broke out in a rash with hives that left scars on his legs   Hydrocodone Rash and Other (See Comments)    Blisters developed on arms; tolerated Norco okay (per sister)    Sulfa Antibiotics Rash    Consultations: None  Procedures/Studies: CT Maxillofacial W Contrast Result Date: 03/24/2023 CLINICAL  DATA:  Sublingual or submandibular abscess EXAM: CT MAXILLOFACIAL WITH CONTRAST TECHNIQUE: Multidetector CT imaging of the maxillofacial structures was performed with intravenous contrast. Multiplanar CT image reconstructions were also generated. RADIATION DOSE REDUCTION: This exam was performed according to the departmental dose-optimization program which includes automated exposure control, adjustment of the mA and/or kV according to patient size and/or use of iterative reconstruction technique. CONTRAST:  75mL OMNIPAQUE  IOHEXOL  350 MG/ML SOLN COMPARISON:  CTA head and neck 09/24/2022 FINDINGS: Osseous: No fracture or mandibular dislocation. No destructive process. Edentulous. Orbits: Negative. No traumatic or inflammatory finding. Sinuses: Clear. Soft tissues: Ill-defined, low-density collection with some areas of peripheral enhancement in the superficial soft tissues of the lower left face, which  measures up to 3.6 x 2.4 x 3.5 cm (AP x TR x CC) (series 3, image 35 and series 7, image 62), with surrounding fat stranding and overlying skin thickening. Reactive changes extend into the more posterior and superior superficial soft tissues of the face. Additional lesions with possible peripheral enhancement in the more posterior left facial soft tissues, measuring 1.9 x 1.5 x 1.7 cm (series 3, image 27) an 0.8 x 0.5 x 0.7 cm (series 3, image 32). Mild enlargement of the left level 1B lymph nodes (series 3, image 13 and 23), which retain normal morphology, likely reactive. Limited intracranial: No significant or unexpected finding. IMPRESSION: 1. Multiple abscesses in the superficial soft tissues of the left face, the largest of which measures up to 3.6 cm, with surrounding inflammatory changes. 2. Mild enlargement of the left level 1B lymph nodes, which retain normal morphology, likely reactive. Electronically Signed   By: Donald Campion M.D.   On: 03/24/2023 17:20   DG Chest Portable 1 View Result Date:  03/24/2023 CLINICAL DATA:  Marjory Blase EXAM: PORTABLE CHEST 1 VIEW COMPARISON:  09/25/2022 FINDINGS: The heart size and mediastinal contours are within normal limits. Mildly coarsened interstitial markings. No focal airspace consolidation, pleural effusion, or pneumothorax. The visualized skeletal structures are unremarkable. IMPRESSION: Mildly coarsened interstitial markings, which may reflect bronchitic type lung changes. No focal airspace consolidation. Electronically Signed   By: Mabel Converse D.O.   On: 03/24/2023 16:03     Subjective: No acute issues or events overnight, pain currently well-controlled, abscess draining freely   Discharge Exam: Vitals:   03/25/23 1100 03/25/23 1208  BP: (!) 132/57   Pulse: 79   Resp:    Temp:  99 F (37.2 C)  SpO2: 94%    Vitals:   03/25/23 0500 03/25/23 0827 03/25/23 1100 03/25/23 1208  BP: 127/69 (!) 142/64 (!) 132/57   Pulse: 80 78 79   Resp: 16 16    Temp: 98.2 F (36.8 C) 99.7 F (37.6 C)  99 F (37.2 C)  TempSrc: Oral Oral  Oral  SpO2: 96% 96% 94%   Weight:      Height:        General: Pt is alert, awake, not in acute distress Cardiovascular: RRR, S1/S2 +, no rubs, no gallops Respiratory: CTA bilaterally, no wheezing, no rhonchi Abdominal: Soft, NT, ND, bowel sounds + Extremities: no edema, no cyanosis Skin: Left lateral jaw abscess draining purulent material    The results of significant diagnostics from this hospitalization (including imaging, microbiology, ancillary and laboratory) are listed below for reference.     Microbiology: Recent Results (from the past 240 hours)  Culture, blood (Routine x 2)     Status: None (Preliminary result)   Collection Time: 03/24/23  3:40 PM   Specimen: BLOOD  Result Value Ref Range Status   Specimen Description BLOOD RIGHT ANTECUBITAL  Final   Special Requests   Final    BOTTLES DRAWN AEROBIC AND ANAEROBIC Blood Culture adequate volume   Culture   Final    NO GROWTH < 24  HOURS Performed at [facility], [address], [place], [place] [address]    Report Status PENDING  Incomplete  Culture, blood (Routine x 2)     Status: None (Preliminary result)   Collection Time: 03/24/23  3:45 PM   Specimen: BLOOD  Result Value Ref Range Status   Specimen Description BLOOD LEFT ANTECUBITAL  Final   Special Requests   Final  BOTTLES DRAWN AEROBIC AND ANAEROBIC Blood Culture results may not be optimal due to an inadequate volume of blood received in culture bottles   Culture   Final    NO GROWTH < 24 HOURS Performed at [facility], [address] [place], [place] [address]    Report Status PENDING  Incomplete     Labs: BNP (last 3 results) Recent Labs    09/28/22 0435 09/29/22 0055 09/30/22 0402  BNP 22.2 14.3 12.8   Basic Metabolic Panel: Recent Labs  Lab 03/24/23 1540 03/25/23 0444  NA 130* 133*  K 3.8 3.8  CL 100 105  CO2 22 20*  GLUCOSE 398* 363*  BUN 16 16  CREATININE 0.97 0.99  CALCIUM  8.1* 7.8*   Liver Function Tests: Recent Labs  Lab 03/24/23 1540  AST 9*  ALT 9  ALKPHOS 84  BILITOT 0.5  PROT 5.6*  ALBUMIN 2.1*   CBC: Recent Labs  Lab 03/24/23 1540 03/25/23 0444  WBC 12.6* 10.3  NEUTROABS 10.0*  --   HGB 9.8* 8.2*  HCT 28.7* 24.3*  MCV 78.0* 78.6*  PLT 260 207   CBG: Recent Labs  Lab 03/24/23 1456 03/25/23 0748 03/25/23 1114  GLUCAP 408* 396* 231*   Urinalysis    Component Value Date/Time   COLORURINE YELLOW 03/24/2023 1956   APPEARANCEUR HAZY (A) 03/24/2023 1956   LABSPEC >1.046 (H) 03/24/2023 1956   PHURINE 5.0 03/24/2023 1956   GLUCOSEU 50 (A) 03/24/2023 1956   HGBUR MODERATE (A) 03/24/2023 1956   BILIRUBINUR NEGATIVE 03/24/2023 1956   KETONESUR NEGATIVE 03/24/2023 1956   PROTEINUR >=300 (A) 03/24/2023 1956   UROBILINOGEN 1.0 03/04/2014 2312   NITRITE NEGATIVE 03/24/2023 1956   LEUKOCYTESUR SMALL (A) 03/24/2023 1956   Sepsis Labs Recent Labs  Lab 03/24/23 1540 03/25/23 0444  WBC  12.6* 10.3   Microbiology Recent Results (from the past 240 hours)  Culture, blood (Routine x 2)     Status: None (Preliminary result)   Collection Time: 03/24/23  3:40 PM   Specimen: BLOOD  Result Value Ref Range Status   Specimen Description BLOOD RIGHT ANTECUBITAL  Final   Special Requests   Final    BOTTLES DRAWN AEROBIC AND ANAEROBIC Blood Culture adequate volume   Culture   Final    NO GROWTH < 24 HOURS Performed at [facility], [address] [place], [place] [address]    Report Status PENDING  Incomplete  Culture, blood (Routine x 2)     Status: None (Preliminary result)   Collection Time: 03/24/23  3:45 PM   Specimen: BLOOD  Result Value Ref Range Status   Specimen Description BLOOD LEFT ANTECUBITAL  Final   Special Requests   Final    BOTTLES DRAWN AEROBIC AND ANAEROBIC Blood Culture results may not be optimal due to an inadequate volume of blood received in culture bottles   Culture   Final    NO GROWTH < 24 HOURS Performed at [facility], [address], [place], [place] [address]    Report Status PENDING  Incomplete     Time coordinating discharge: Over 30 minutes  SIGNED:   Elsie JAYSON [place], DO [facility] 03/25/2023, 1:27 PM Pager   If 7PM-7AM, please contact night-coverage [email or web address]

## 2023-03-25 NOTE — Care Management (Signed)
 Transition of Care [facility]) - Inpatient Brief Assessment   Patient Details  Name: Vincent [name] MRN: [id number] Date of Birth: 1965-09-22  [facility] CM/SW Contact:    Corean JAYSON Canary, RN Phone Number: 03/25/2023, 10:14 AM   Clinical Narrative:  Patient presented with abbccess facial. Is diabetic, having trouble ambulating.  He lives with his sister. Discussed disposition with team. Home health orders to be placed. Cheryl rose with [facility] will accept.  For PT OT RN and CSW. The patient has had a foley in , he will discharge today pending 1100 blood sugar, will likely need [facility] for transport home.  Transition of Care Asessment: Insurance and Status: Insurance coverage has been reviewed Patient has primary care physician: Yes Home environment has been reviewed: Lives with sister Prior level of function:: Having trouble ambulating needs assistance Prior/Current Home Services: No current home services Social Drivers of Health Review: SDOH reviewed no interventions necessary Readmission risk has been reviewed: Yes Transition of care needs: transition of care needs identified, TOC will continue to follow

## 2023-03-25 NOTE — ED Notes (Signed)
 Per MD, recheck CBG at 1100 and give ordered insulin. Pt may D/C with transport after.

## 2023-03-25 NOTE — Care Management CC44 (Signed)
 Condition Code 44 Documentation Completed  Patient Details  Name: Vincent [name] MRN: [id number] Date of Birth: 08/18/1965   Condition Code 44 given:  Yes Patient signature on Condition Code 44 notice:  Yes Documentation of 2 MD's agreement:  Yes Code 44 added to claim:  Yes    Corean JAYSON Canary, RN 03/25/2023, 10:27 AM

## 2023-03-25 NOTE — ED Notes (Signed)
 [facility] called for transport.

## 2023-03-25 NOTE — Care Management Obs Status (Signed)
 MEDICARE OBSERVATION STATUS NOTIFICATION   Patient Details  Name: Vincent [name] MRN: [id number] Date of Birth: 1966/03/11   Medicare Observation Status Notification Given:  Yes    Lockie Pares, RN 03/25/2023, 10:27 AM

## 2023-03-28 ENCOUNTER — Emergency Department (HOSPITAL_COMMUNITY)
Admission: EM | Admit: 2023-03-28 | Discharge: 2023-03-31 | Disposition: A | Discharge status: Skilled Nursing Facility | Payer: 59 | Attending: Emergency Medicine | Admitting: Emergency Medicine

## 2023-03-28 ENCOUNTER — Encounter (HOSPITAL_COMMUNITY): Payer: Self-pay

## 2023-03-28 DIAGNOSIS — Z23 Encounter for immunization: Secondary | ICD-10-CM | POA: Insufficient documentation

## 2023-03-28 DIAGNOSIS — I10 Essential (primary) hypertension: Secondary | ICD-10-CM | POA: Insufficient documentation

## 2023-03-28 DIAGNOSIS — R600 Localized edema: Secondary | ICD-10-CM

## 2023-03-28 DIAGNOSIS — R0602 Shortness of breath: Secondary | ICD-10-CM | POA: Diagnosis not present

## 2023-03-28 DIAGNOSIS — L89609 Pressure ulcer of unspecified heel, unspecified stage: Secondary | ICD-10-CM | POA: Diagnosis not present

## 2023-03-28 DIAGNOSIS — Z79899 Other long term (current) drug therapy: Secondary | ICD-10-CM | POA: Insufficient documentation

## 2023-03-28 DIAGNOSIS — Z794 Long term (current) use of insulin: Secondary | ICD-10-CM | POA: Diagnosis not present

## 2023-03-28 DIAGNOSIS — E119 Type 2 diabetes mellitus without complications: Secondary | ICD-10-CM | POA: Insufficient documentation

## 2023-03-28 LAB — CBC WITH DIFFERENTIAL/PLATELET
Abs Immature Granulocytes: 0.13 10*3/uL — ABNORMAL HIGH (ref 0.00–0.07)
Basophils Absolute: 0.1 10*3/uL (ref 0.0–0.1)
Basophils Relative: 1 %
Eosinophils Absolute: 0.2 10*3/uL (ref 0.0–0.5)
Eosinophils Relative: 2 %
HCT: 27.9 % — ABNORMAL LOW (ref 39.0–52.0)
Hemoglobin: 9.3 g/dL — ABNORMAL LOW (ref 13.0–17.0)
Immature Granulocytes: 2 %
Lymphocytes Relative: 19 %
Lymphs Abs: 1.6 10*3/uL (ref 0.7–4.0)
MCH: 26.9 pg (ref 26.0–34.0)
MCHC: 33.3 g/dL (ref 30.0–36.0)
MCV: 80.6 fL (ref 80.0–100.0)
Monocytes Absolute: 0.7 10*3/uL (ref 0.1–1.0)
Monocytes Relative: 8 %
Neutro Abs: 5.7 10*3/uL (ref 1.7–7.7)
Neutrophils Relative %: 68 %
Platelets: 281 10*3/uL (ref 150–400)
RBC: 3.46 MIL/uL — ABNORMAL LOW (ref 4.22–5.81)
RDW: 13 % (ref 11.5–15.5)
WBC: 8.2 10*3/uL (ref 4.0–10.5)
nRBC: 0 % (ref 0.0–0.2)

## 2023-03-28 LAB — COMPREHENSIVE METABOLIC PANEL
ALT: 10 U/L (ref 0–44)
AST: 13 U/L — ABNORMAL LOW (ref 15–41)
Albumin: 2.1 g/dL — ABNORMAL LOW (ref 3.5–5.0)
Alkaline Phosphatase: 125 U/L (ref 38–126)
Anion gap: 10 (ref 5–15)
BUN: 10 mg/dL (ref 6–20)
CO2: 18 mmol/L — ABNORMAL LOW (ref 22–32)
Calcium: 8.4 mg/dL — ABNORMAL LOW (ref 8.9–10.3)
Chloride: 111 mmol/L (ref 98–111)
Creatinine, Ser: 1.05 mg/dL (ref 0.61–1.24)
GFR, Estimated: >60 mL/min (ref >60)
Glucose, Bld: 438 mg/dL — ABNORMAL HIGH (ref 70–99)
Potassium: 3.7 mmol/L (ref 3.5–5.1)
Sodium: 139 mmol/L (ref 135–145)
Total Bilirubin: 0.4 mg/dL (ref 0.0–1.2)
Total Protein: 5.4 g/dL — ABNORMAL LOW (ref 6.5–8.1)

## 2023-03-28 LAB — BRAIN NATRIURETIC PEPTIDE: B Natriuretic Peptide: 119.9 pg/mL — ABNORMAL HIGH (ref 0.0–100.0)

## 2023-03-28 MED ORDER — OXYCODONE-ACETAMINOPHEN 5-325 MG PO TABS
1.0000 | ORAL_TABLET | Freq: Once | ORAL | Status: AC
  Administered 2023-03-28: 1 via ORAL
  Filled 2023-03-28: qty 1

## 2023-03-28 NOTE — ED Notes (Signed)
 Sister, pt's primary caregiver, concerned due to pt not being able to ambulate well at home. Requesting admission, after conversation with primary care doctor, for rehab to regain strength in legs. Bilateral lower leg edema. urine emptied from foley bag. Pt also had recent abscess on his face that continues to drain.

## 2023-03-28 NOTE — ED Triage Notes (Signed)
 Bilateral leg swelling x 2 weeks. Unable to walk without help due to weakness and swelling. EMS also reports blisters to both ankles. Seen here 4 days ago for same complaints.   EMS VS: BP 130/68 HR 63 RR 18 99% on RA CBG 456

## 2023-03-28 NOTE — ED Provider Triage Note (Signed)
 Emergency Medicine Provider Triage Evaluation Note  Vincent [name] , a 58 y.o. male  was evaluated in triage.  Pt complains of bilateral leg swelling for the last 2 weeks. Symptoms worsening in the last 2 days. No fall. No fever. Starting taking abx yesterday for facial abscess.   Review of Systems  Positive: Leg swelling Negative: CP, SOB, fever,   Physical Exam  BP (!) 93/54 (BP Location: Left Arm)   Pulse 61   Temp 98.4 F (36.9 C) (Oral)   Resp 16   Ht 6' (1.829 m)   Wt 80.7 kg   SpO2 100%   BMI 24.13 kg/m  Gen:   Awake, no distress   Resp:  Normal effort  MSK:   Moves extremities without difficulty    Medical Decision Making  Medically screening exam initiated at 7:02 PM.  Appropriate orders placed.  Vincent [name] was informed that the remainder of the evaluation will be completed by another provider, this initial triage assessment does not replace that evaluation, and the importance of remaining in the ED until their evaluation is complete.    Roberts Ching, MD 03/28/23 [phone]

## 2023-03-29 ENCOUNTER — Emergency Department (HOSPITAL_COMMUNITY): Payer: 59

## 2023-03-29 DIAGNOSIS — R609 Edema, unspecified: Secondary | ICD-10-CM | POA: Diagnosis not present

## 2023-03-29 LAB — CBG MONITORING, ED
Glucose-Capillary: 307 mg/dL — ABNORMAL HIGH (ref 70–99)
Glucose-Capillary: 281 mg/dL — ABNORMAL HIGH (ref 70–99)
Glucose-Capillary: 292 mg/dL — ABNORMAL HIGH (ref 70–99)
Glucose-Capillary: 264 mg/dL — ABNORMAL HIGH (ref 70–99)
Glucose-Capillary: 121 mg/dL — ABNORMAL HIGH (ref 70–99)

## 2023-03-29 LAB — CULTURE, BLOOD (ROUTINE X 2)
Culture: NO GROWTH
Culture: NO GROWTH
Special Requests: ADEQUATE

## 2023-03-29 LAB — D-DIMER, QUANTITATIVE: D-Dimer, Quant: 1.75 ug{FEU}/mL — ABNORMAL HIGH (ref 0.00–0.50)

## 2023-03-29 MED ORDER — TETANUS-DIPHTH-ACELL PERTUSSIS 5-2.5-18.5 LF-MCG/0.5 IM SUSY
0.5000 mL | PREFILLED_SYRINGE | Freq: Once | INTRAMUSCULAR | Status: AC
  Administered 2023-03-29: 0.5 mL via INTRAMUSCULAR
  Filled 2023-03-29: qty 0.5

## 2023-03-29 MED ORDER — GADOBUTROL 1 MMOL/ML IV SOLN
7.0000 mL | Freq: Once | INTRAVENOUS | Status: AC | PRN
  Administered 2023-03-29: 7 mL via INTRAVENOUS

## 2023-03-29 MED ORDER — IOHEXOL 350 MG/ML SOLN
60.0000 mL | Freq: Once | INTRAVENOUS | Status: AC | PRN
  Administered 2023-03-29: 60 mL via INTRAVENOUS

## 2023-03-29 MED ORDER — PREGABALIN 50 MG PO CAPS
200.0000 mg | ORAL_CAPSULE | Freq: Three times a day (TID) | ORAL | Status: DC
  Administered 2023-03-29 – 2023-03-31 (×7): 200 mg via ORAL
  Filled 2023-03-29 (×4): qty 4
  Filled 2023-03-29: qty 2
  Filled 2023-03-29: qty 8
  Filled 2023-03-29: qty 4

## 2023-03-29 MED ORDER — DOXYCYCLINE HYCLATE 50 MG PO CAPS
50.0000 mg | ORAL_CAPSULE | Freq: Two times a day (BID) | ORAL | Status: DC
  Administered 2023-03-29 – 2023-03-31 (×5): 50 mg via ORAL
  Filled 2023-03-29 (×7): qty 1

## 2023-03-29 MED ORDER — INSULIN ASPART 100 UNIT/ML IJ SOLN: 5.0000 [IU] | Freq: Three times a day (TID) | INTRAMUSCULAR | Status: DC

## 2023-03-29 MED ORDER — INSULIN ASPART 100 UNIT/ML IJ SOLN
0.0000 [IU] | Freq: Three times a day (TID) | INTRAMUSCULAR | Status: DC
  Administered 2023-03-29: 8 [IU] via SUBCUTANEOUS
  Administered 2023-03-30 – 2023-03-31 (×2): 2 [IU] via SUBCUTANEOUS

## 2023-03-29 MED ORDER — INSULIN GLARGINE-YFGN 100 UNIT/ML ~~LOC~~ SOLN
25.0000 [IU] | Freq: Two times a day (BID) | SUBCUTANEOUS | Status: DC
Start: 2023-03-29 — End: 2023-03-31
  Administered 2023-03-29 – 2023-03-31 (×5): 25 [IU] via SUBCUTANEOUS
  Filled 2023-03-29 (×8): qty 0.25

## 2023-03-29 MED ORDER — LURASIDONE HCL 40 MG PO TABS
40.0000 mg | ORAL_TABLET | Freq: Every day | ORAL | Status: DC
  Administered 2023-03-29 – 2023-03-31 (×3): 40 mg via ORAL
  Filled 2023-03-29 (×4): qty 1

## 2023-03-29 MED ORDER — PANTOPRAZOLE SODIUM 40 MG PO TBEC
40.0000 mg | DELAYED_RELEASE_TABLET | Freq: Every day | ORAL | Status: DC
Start: 2023-03-29 — End: 2023-03-31
  Administered 2023-03-29 – 2023-03-31 (×3): 40 mg via ORAL
  Filled 2023-03-29 (×3): qty 1

## 2023-03-29 MED ORDER — ROSUVASTATIN CALCIUM 5 MG PO TABS
10.0000 mg | ORAL_TABLET | Freq: Every day | ORAL | Status: DC
  Administered 2023-03-29 – 2023-03-31 (×3): 10 mg via ORAL
  Filled 2023-03-29 (×3): qty 2

## 2023-03-29 NOTE — ED Provider Notes (Signed)
[place] EMERGENCY DEPARTMENT AT [facility] Provider Note   CSN: [id number] Arrival date & time: 03/28/23  1851     History  Chief Complaint  Patient presents with   Leg Swelling    Vincent [name] is a 58 y.o. male.  The history is provided by the patient.  Vincent [name] is a 58 y.o. male who presents to the Emergency Department complaining of leg swelling.  He presents to the emergency department for evaluation of 2 weeks of progressive bilateral lower extremity edema.  He has recently been on antibiotics for a facial abscess.  He does report associated diarrhea.  No fevers.  He has a history of diabetes, hypertension, recurrent skin infections.  He states that he is having mobility issues secondary to the swelling.  He reports 2 months of low back pain, no reported injuries.  No fevers.    Home Medications Prior to Admission medications   Medication Sig Start Date End Date Taking? Authorizing Provider  acetaminophen (TYLENOL) 500 MG tablet Take 1,000 mg by mouth every 6 (six) hours as needed for mild pain (pain score 1-3) or moderate pain (pain score 4-6).    [provider]  amLODipine (NORVASC) 10 MG tablet Take 10 mg by mouth daily. 03/15/23   [provider]  Cholecalciferol (VITAMIN D3) 1000 units CAPS Take 1,000 Units by mouth in the morning.    [provider]  doxycycline (VIBRAMYCIN) 50 MG capsule Take 1 capsule (50 mg total) by mouth 2 (two) times daily for 14 days. 03/25/23 04/08/23  Azucena Fallen, MD  hydroxychloroquine (PLAQUENIL) 200 MG tablet Take 200 mg by mouth 2 (two) times daily. 03/15/23   [provider]  hydrOXYzine (ATARAX) 25 MG tablet Take 25 mg by mouth at bedtime. 08/01/22   [provider]  insulin aspart (NOVOLOG) 100 UNIT/ML injection Inject 3 Units into the skin 3 (three) times daily with meals. Patient taking differently: Inject 5-40 Units into the skin 3 (three) times daily with meals.  01/23/23   Rhetta Mura, MD  insulin glargine, 2 Unit Dial, (TOUJEO MAX SOLOSTAR) 300 UNIT/ML Solostar Pen Inject 25 Units into the skin 2 (two) times daily at 8 am and 10 pm. 03/25/23   Azucena Fallen, MD  lurasidone (LATUDA) 40 MG TABS tablet Take 40 mg by mouth daily with breakfast. 03/15/23   [provider]  omeprazole (PRILOSEC) 40 MG capsule Take 40 mg by mouth daily before breakfast. 08/23/21   [provider]  pregabalin (LYRICA) 200 MG capsule Take 1 capsule (200 mg total) by mouth 3 (three) times daily. 01/23/23   Rhetta Mura, MD  Prenatal 27-1 MG TABS Take 1 tablet by mouth daily. 03/23/23   [provider]  rOPINIRole (REQUIP) 0.5 MG tablet Take 0.5 mg by mouth. 03/20/23   [provider]  rosuvastatin (CRESTOR) 10 MG tablet Take 10 mg by mouth daily. 03/15/23   [provider]  sodium chloride flush (NS) 0.9 % SOLN 5 mLs by Intracatheter route every 8 (eight) hours. Patient not taking: Reported on 03/25/2023 01/25/23   Almon Hercules, MD  traZODone (DESYREL) 50 MG tablet Take 50 mg by mouth at bedtime. 02/10/23   [provider]  venlafaxine XR (EFFEXOR-XR) 150 MG 24 hr capsule Take 1 capsule (150 mg total) by mouth daily with breakfast. 01/23/23   Rhetta Mura, MD      Allergies    Celecoxib, Sucralfate, Hydrocodone, and Sulfa antibiotics  Review of Systems   Review of Systems  All other systems reviewed and are negative.   Physical Exam Updated Vital Signs BP 120/67   Pulse 67   Temp 97.9 F (36.6 C)   Resp 16   Ht 6' (1.829 m)   Wt 70.9 kg   SpO2 99%   BMI 21.20 kg/m  Physical Exam Vitals and nursing note reviewed.  Constitutional:      Appearance: He is well-developed.  HENT:     Head: Normocephalic.     Comments: Spontaneously draining abscess that is overall healing when compared to notes to the left cheek. Cardiovascular:     Rate and Rhythm: Normal rate and regular rhythm.      Heart sounds: No murmur heard. Pulmonary:     Effort: Pulmonary effort is normal. No respiratory distress.     Breath sounds: Normal breath sounds.  Abdominal:     Palpations: Abdomen is soft.     Tenderness: There is no abdominal tenderness. There is no guarding or rebound.  Musculoskeletal:        General: No tenderness.     Comments: 2-3+ pitting edema to bilateral lower extremities.  1+ pedal pulses bilaterally.  There are vesicles to bilateral heels.  The left heel has a clear fluid present.  The right heel has a component of hemorrhagic fluid present.  Skin:    General: Skin is warm and dry.  Neurological:     Mental Status: He is alert and oriented to person, place, and time.     Comments: 4 out of 5 strength in all 4 extremities  Psychiatric:        Behavior: Behavior normal.        ED Results / Procedures / Treatments   Labs (all labs ordered are listed, but only abnormal results are displayed) Labs Reviewed  COMPREHENSIVE METABOLIC PANEL - Abnormal; Notable for the following components:      Result Value   CO2 18 (*)    Glucose, Bld 438 (*)    Calcium 8.4 (*)    Total Protein 5.4 (*)    Albumin 2.1 (*)    AST 13 (*)    All other components within normal limits  CBC WITH DIFFERENTIAL/PLATELET - Abnormal; Notable for the following components:   RBC 3.46 (*)    Hemoglobin 9.3 (*)    HCT 27.9 (*)    Abs Immature Granulocytes 0.13 (*)    All other components within normal limits  BRAIN NATRIURETIC PEPTIDE - Abnormal; Notable for the following components:   B Natriuretic Peptide 119.9 (*)    All other components within normal limits  D-DIMER, QUANTITATIVE - Abnormal; Notable for the following components:   D-Dimer, Quant 1.75 (*)    All other components within normal limits  CBG MONITORING, ED    EKG None  Radiology CT Angio Chest PE W/Cm &/Or Wo Cm Result Date: 03/29/2023 CLINICAL DATA:  Pulmonary embolism suspected, low to intermediate probability.  Positive D-dimer. EXAM: CT ANGIOGRAPHY CHEST WITH CONTRAST TECHNIQUE: Multidetector CT imaging of the chest was performed using the standard protocol during bolus administration of intravenous contrast. Multiplanar CT image reconstructions and MIPs were obtained to evaluate the vascular anatomy. RADIATION DOSE REDUCTION: This exam was performed according to the departmental dose-optimization program which includes automated exposure control, adjustment of the mA and/or kV according to patient size and/or use of iterative reconstruction technique. CONTRAST:  60mL OMNIPAQUE IOHEXOL 350 MG/ML SOLN COMPARISON:  09/24/2022 FINDINGS: Cardiovascular: Satisfactory  opacification of the pulmonary arteries to the segmental level. No evidence of pulmonary embolism. Normal heart size. No pericardial effusion. Atheromatous calcification of the aorta and coronaries. Approximally 60% stenosis at the origin of the left common carotid. Mediastinum/Nodes: Negative for mass or adenopathy. Lungs/Pleura: Diffuse airway thickening, chronic compared to prior CTs. There is no edema, consolidation, effusion, or pneumothorax. Subpleural nodule in the right lower lobe adjacent to the upper major fissure measuring 5 mm in average diameter, round appearing on axial images but more angular on coronal reformats, new from prior. Areas of minimal subpleural reticulation which is likely atelectasis. Upper Abdomen: No acute finding. Cholecystectomy and atherosclerosis. Musculoskeletal: No acute finding. Review of the MIP images confirms the above findings. IMPRESSION: 1. Negative for pulmonary embolism or other acute finding. 2. Generalized airway thickening. 3. Atherosclerosis including the coronary arteries. 60% narrowing of the left common carotid origin. 4. Interval 5 mm pulmonary nodule in the right lower lobe. If patient is low risk for malignancy, no routine follow-up imaging is recommended. If patient is high risk for malignancy, a  non-contrast chest CT at 12 months is optional.This recommendation follows the consensus statement: Guidelines for Management of Incidental Pulmonary Nodules Detected on CT Images: From the Fleischner Society 2017; Radiology 2017; [phone]. Electronically Signed   By: Tiburcio Pea M.D.   On: 03/29/2023 05:02   MR Lumbar Spine W Wo Contrast Result Date: 03/29/2023 CLINICAL DATA:  Acute thoracolumbar myelopathy EXAM: MRI THORACIC AND LUMBAR SPINE WITHOUT AND WITH CONTRAST TECHNIQUE: Multiplanar and multiecho pulse sequences of the thoracic and lumbar spine were obtained without and with intravenous contrast. CONTRAST:  7mL GADAVIST GADOBUTROL 1 MMOL/ML IV SOLN COMPARISON:  08/26/2022 lumbar spine MR FINDINGS: MRI THORACIC SPINE FINDINGS Alignment:  Normal Vertebrae: No fracture, evidence of discitis, or bone lesion. T3 hemangioma Cord:  Normal Paraspinal and other soft tissues: Negative. Disc levels: No spinal canal or neural foraminal stenosis. MRI LUMBAR SPINE FINDINGS Segmentation:  Standard. Alignment:  Physiologic. Vertebrae:  No fracture, evidence of discitis, or bone lesion. Conus medullaris: Extends to the L1 level and appears normal. Paraspinal and other soft tissues: Negative Disc levels: L1-L2: Normal disc space and facet joints. No spinal canal stenosis. No neural foraminal stenosis. L2-L3: Normal disc space and facet joints. No spinal canal stenosis. No neural foraminal stenosis. L3-L4: Small disc bulge. No spinal canal stenosis. Mild bilateral neural foraminal stenosis. L4-L5: Normal disc space and facet joints. No spinal canal stenosis. No neural foraminal stenosis. L5-S1: Small central disc protrusion, unchanged. No spinal canal stenosis. No neural foraminal stenosis. Visualized sacrum: Normal. No abnormal contrast enhancement of the thoracic or lumbar spine. IMPRESSION: 1. No acute abnormality of the thoracic or lumbar spine. 2. Mild bilateral L3-L4 neural foraminal stenosis. 3. No spinal  canal stenosis. Electronically Signed   By: Deatra Robinson M.D.   On: 03/29/2023 03:47   MR THORACIC SPINE W WO CONTRAST Result Date: 03/29/2023 CLINICAL DATA:  Acute thoracolumbar myelopathy EXAM: MRI THORACIC AND LUMBAR SPINE WITHOUT AND WITH CONTRAST TECHNIQUE: Multiplanar and multiecho pulse sequences of the thoracic and lumbar spine were obtained without and with intravenous contrast. CONTRAST:  7mL GADAVIST GADOBUTROL 1 MMOL/ML IV SOLN COMPARISON:  08/26/2022 lumbar spine MR FINDINGS: MRI THORACIC SPINE FINDINGS Alignment:  Normal Vertebrae: No fracture, evidence of discitis, or bone lesion. T3 hemangioma Cord:  Normal Paraspinal and other soft tissues: Negative. Disc levels: No spinal canal or neural foraminal stenosis. MRI LUMBAR SPINE FINDINGS Segmentation:  Standard. Alignment:  Physiologic. Vertebrae:  No fracture, evidence of discitis, or bone lesion. Conus medullaris: Extends to the L1 level and appears normal. Paraspinal and other soft tissues: Negative Disc levels: L1-L2: Normal disc space and facet joints. No spinal canal stenosis. No neural foraminal stenosis. L2-L3: Normal disc space and facet joints. No spinal canal stenosis. No neural foraminal stenosis. L3-L4: Small disc bulge. No spinal canal stenosis. Mild bilateral neural foraminal stenosis. L4-L5: Normal disc space and facet joints. No spinal canal stenosis. No neural foraminal stenosis. L5-S1: Small central disc protrusion, unchanged. No spinal canal stenosis. No neural foraminal stenosis. Visualized sacrum: Normal. No abnormal contrast enhancement of the thoracic or lumbar spine. IMPRESSION: 1. No acute abnormality of the thoracic or lumbar spine. 2. Mild bilateral L3-L4 neural foraminal stenosis. 3. No spinal canal stenosis. Electronically Signed   By: Deatra Robinson M.D.   On: 03/29/2023 03:47    Procedures Debridement  Date/Time: 03/29/2023 6:15 AM  Performed by: Tilden Fossa, MD Authorized by: Tilden Fossa, MD  Consent:  Verbal consent obtained. Consent given by: patient Patient identity confirmed: verbally with patient Local anesthesia used: no  Anesthesia: Local anesthesia used: no  Sedation: Patient sedated: no  Comments: Right heel wound spontaneously drained.  Wound was cleansed with chlorhexidine and devitalized tissue was debrided.  Wound was again cleaned and dressed.       Medications Ordered in ED Medications  oxyCODONE-acetaminophen (PERCOCET/ROXICET) 5-325 MG per tablet 1 tablet (1 tablet Oral Given 03/28/23 1957)  oxyCODONE-acetaminophen (PERCOCET/ROXICET) 5-325 MG per tablet 1 tablet (1 tablet Oral Given 03/28/23 2223)  gadobutrol (GADAVIST) 1 MMOL/ML injection 7 mL (7 mLs Intravenous Contrast Given 03/29/23 0310)  iohexol (OMNIPAQUE) 350 MG/ML injection 60 mL (60 mLs Intravenous Contrast Given 03/29/23 5284)    ED Course/ Medical Decision Making/ A&P                                 Medical Decision Making Amount and/or Complexity of Data Reviewed Labs: ordered. Radiology: ordered.  Risk Prescription drug management.   Patient with history of poorly controlled diabetes, multiple skin infections currently on antibiotics here for evaluation of progressive lower extremity edema for 2 weeks.  He also has vesicles to bilateral heels.  On examination these do not appear to be acutely infected.  He does have soft tissue swelling to the legs.  He has mild shortness of breath and a D-dimer was obtained, which is elevated.  CTA is negative for PE or pulmonary edema.  BNP is minimally elevated.  Blood pressures are normal and during recent hospitalization he had issues with orthostasis, would not start diuretics at this time due to concern for worsening orthostasis.  BMP with hyperglycemia, mildly low bicarb but normal anion gap, suspect this is secondary to dehydration.  Given patient's progressive bilateral lower extremity weakness and edema MRIs were obtained, which were negative for acute  discitis or cord compression.  Suspect his progressive weakness and edema are multifactorial.  Plan to obtain DVT studies.  Given his weakness will obtain PT OT and TOC consult regarding additional home care options.  During patient's ED stay he did have spontaneous rupture of the vesicle to his right heel.  Given rupture the area was debrided and a clean dressing was placed.        Final Clinical Impression(s) / ED Diagnoses Final diagnoses:  None    Rx / DC Orders ED Discharge Orders     None  Tilden Fossa, MD 03/29/23 [phone]

## 2023-03-29 NOTE — Progress Notes (Addendum)
VASCULAR LAB    Bilateral lower extremity venous duplex has been performed.  See CV proc for preliminary results.  Messaged negative results to Dr. Maple Hudson  via secure chat  Sherren Kerns, RVT 03/29/2023, 8:55 AM

## 2023-03-29 NOTE — [place] FL2 (Signed)
[place] MEDICAID FL2 LEVEL OF CARE FORM     IDENTIFICATION  Patient Name: Vincent [name] [facility] Birthdate: 1965-05-05 Sex: male Admission Date (Current Location): 03/28/2023  [facility] and [place] Number:  Producer, television/film/video and Address:  [facility], [address], [place], [place] [address]      Provider Number: [id number]  Attending Physician Name and Address:  Vanetta Mulders, MD  Relative Name and Phone Number:  blansett,renee (Sister)  [phone]    Current Level of Care: Hospital Recommended Level of Care: Skilled Nursing Facility Prior Approval Number:    Date Approved/Denied:   PASRR Number: [id number]  Discharge Plan: SNF    Current Diagnoses: Patient Active Problem List   Diagnosis Date Noted   Abscess, jaw 03/25/2023   Facial abscess 03/24/2023   Hx of abdominal abscess 01/18/2023   Sepsis with acute renal failure (HCC) 09/25/2022   Abscess, gluteal, right 09/25/2022   Peripheral neuropathy in hands 06/08/2022   Essential hypertension 05/23/2022   BPH (benign prostatic hyperplasia) 05/23/2022   AKI (acute kidney injury) (HCC) 09/04/2021   Hypotension 09/03/2021   History of seizure 08/02/2021   Cerebral thrombosis with cerebral infarction 11/16/2020   Ischemic stroke (HCC) 06/10/2020   Pain due to onychomycosis of toenails of both feet 01/28/2020   Hyperglycemia due to type 2 diabetes mellitus (HCC) 01/07/2020   Resistance to insulin 01/07/2020   Right leg weakness 04/24/2019   Right leg pain 04/24/2019   Gait abnormality 04/24/2019   TIA (transient ischemic attack) 10/20/2018   DM2 (diabetes mellitus, type 2) (HCC) 10/19/2018   Left sided numbness 10/19/2018   Diabetic autonomic neuropathy associated with type 2 diabetes mellitus (HCC) 02/01/2018   DM type 2 with diabetic peripheral neuropathy (HCC) 02/01/2018   Dyslipidemia 01/31/2018   Coronary artery disease involving native coronary artery of native heart without angina  pectoris 01/31/2018   Chest pain 01/01/2018   Breakthrough seizure (HCC) 12/19/2016   Essential tremor 12/19/2016   Chronic post-traumatic stress disorder (PTSD) 06/16/2016   Tremor 06/16/2016   Tobacco abuse 05/05/2016   Altered mental status 05/28/2015   Type 2 diabetes mellitus with hyperglycemia (HCC) 05/28/2015   Hypothyroidism 05/28/2015   Bipolar 2 disorder (HCC) 05/28/2015   History of rheumatoid arthritis 05/28/2015   Chronic pain 05/28/2015   Bipolar II disorder, most recent episode major depressive (HCC) 03/18/2015   Carpal tunnel syndrome on left 02/10/2015   Carpal tunnel syndrome on right 02/10/2015   Diabetes (HCC) 01/23/2015   Obesity (BMI 30-39.9) 03/06/2014   Acute encephalopathy 03/05/2014   Hyperlipidemia 03/05/2014   Anemia, normocytic normochromic 03/05/2014   Altered mental state    Helicobacter pylori (H. pylori) infection 11/20/2012   Protein-calorie malnutrition, severe (HCC) 10/09/2012   Dysphagia 08/19/2012   Gastroparesis 08/19/2012   Bladder tumor 08/08/2012   Biliary dyskinesia 11/02/2010    Orientation RESPIRATION BLADDER Height & Weight     Self, Time, Situation, Place  Normal Continent Weight: 156 lb 4.9 oz (70.9 kg) Height:  6' (182.9 cm)  BEHAVIORAL SYMPTOMS/MOOD NEUROLOGICAL BOWEL NUTRITION STATUS      Continent Diet (Carb Modified)  AMBULATORY STATUS COMMUNICATION OF NEEDS Skin   Extensive Assist Verbally Normal                       Personal Care Assistance Level of Assistance  Bathing, Feeding, Dressing Bathing Assistance: Maximum assistance Feeding assistance: Independent Dressing Assistance: Maximum assistance     Functional Limitations  Info  Sight, Hearing, Speech Sight Info: Adequate Hearing Info: Adequate Speech Info: Adequate    SPECIAL CARE FACTORS FREQUENCY                       Contractures Contractures Info: Not present    Additional Factors Info  Code Status, Allergies Code Status Info:  Full Allergies Info: Celecoxib, Sucralfate, Hydrocodone, Sulfa Antibiotics           Current Medications (03/29/2023):  This is the current hospital active medication list Current Facility-Administered Medications  Medication Dose Route Frequency Provider Last Rate Last Admin   doxycycline (VIBRAMYCIN) 50 MG capsule 50 mg  50 mg Oral BID Tilden Fossa, MD   50 mg at 03/29/23 1003   insulin aspart (novoLOG) injection 0-15 Units  0-15 Units Subcutaneous TID WC Estanislado Pandy J, DO   8 Units at 03/29/23 1755   insulin glargine-yfgn (SEMGLEE) injection 25 Units  25 Units Subcutaneous BID AC & HS Tilden Fossa, MD   25 Units at 03/29/23 1005   lurasidone (LATUDA) tablet 40 mg  40 mg Oral Q breakfast Tilden Fossa, MD   40 mg at 03/29/23 1003   pantoprazole (PROTONIX) EC tablet 40 mg  40 mg Oral Daily Tilden Fossa, MD   40 mg at 03/29/23 1003   pregabalin (LYRICA) capsule 200 mg  200 mg Oral TID Tilden Fossa, MD   200 mg at 03/29/23 1004   rosuvastatin (CRESTOR) tablet 10 mg  10 mg Oral Daily Tilden Fossa, MD   10 mg at 03/29/23 1004   Current Outpatient Medications  Medication Sig Dispense Refill   acetaminophen (TYLENOL) 500 MG tablet Take 1,000 mg by mouth every 6 (six) hours as needed for mild pain (pain score 1-3) or moderate pain (pain score 4-6).     amLODipine (NORVASC) 10 MG tablet Take 10 mg by mouth daily.     doxycycline (VIBRAMYCIN) 50 MG capsule Take 1 capsule (50 mg total) by mouth 2 (two) times daily for 14 days. 28 capsule 0   hydroxychloroquine (PLAQUENIL) 200 MG tablet Take 200 mg by mouth 2 (two) times daily.     hydrOXYzine (ATARAX) 25 MG tablet Take 25 mg by mouth at bedtime.     insulin glargine, 2 Unit Dial, (TOUJEO MAX SOLOSTAR) 300 UNIT/ML Solostar Pen Inject 25 Units into the skin 2 (two) times daily at 8 am and 10 pm. (Patient taking differently: Inject 35 Units into the skin daily.) 15 mL 0   insulin lispro (HUMALOG) 100 UNIT/ML injection Inject 35 Units  into the skin in the morning and at bedtime.     lurasidone (LATUDA) 40 MG TABS tablet Take 40 mg by mouth daily with breakfast.     omeprazole (PRILOSEC) 40 MG capsule Take 40 mg by mouth daily before breakfast.     pregabalin (LYRICA) 200 MG capsule Take 1 capsule (200 mg total) by mouth 3 (three) times daily. 12 capsule 0   rOPINIRole (REQUIP) 0.5 MG tablet Take 0.5 mg by mouth at bedtime.     rosuvastatin (CRESTOR) 10 MG tablet Take 10 mg by mouth at bedtime.     traZODone (DESYREL) 50 MG tablet Take 50 mg by mouth at bedtime.     venlafaxine XR (EFFEXOR-XR) 150 MG 24 hr capsule Take 1 capsule (150 mg total) by mouth daily with breakfast. 12 capsule 0   Vitamin D, Ergocalciferol, (DRISDOL) 1.25 MG (50000 UNIT) CAPS capsule Take 50,000 Units by mouth once a  week.     Prenatal 27-1 MG TABS Take 1 tablet by mouth daily. (Patient not taking: Reported on 03/29/2023)     sodium chloride flush (NS) 0.9 % SOLN 5 mLs by Intracatheter route every 8 (eight) hours. (Patient not taking: Reported on 03/25/2023)       Discharge Medications: Please see discharge summary for a list of discharge medications.  Relevant Imaging Results:  Relevant Lab Results:   Additional Information SSN [id number]  Susa Simmonds, LCSWA

## 2023-03-29 NOTE — Progress Notes (Signed)
? ?  Inpatient Rehab Admissions Coordinator : ? ?Per therapy recommendations patient was screened for CIR candidacy by Xiomar Crompton RN MSN. Patient does not appear to demonstrate the medical neccesity for a Hospital Rehabilitation /CIR admit. I will not place a Rehab Consult. Recommend other Rehab Venues to be pursued. Please contact me with any questions. ? ?Ally Knodel RN MSN ?Admissions Coordinator ?[phone]  ?

## 2023-03-29 NOTE — ED Notes (Signed)
Pt in vascular at this time.

## 2023-03-29 NOTE — ED Provider Notes (Signed)
Received Sign out by Dr. Madilyn Hook with pending ultrasound and TOC; see their note for full HPI. Ultrasound negative. TOC/Pt to evaluate.    Coral Spikes, DO 03/29/23 [phone]

## 2023-03-29 NOTE — Progress Notes (Signed)
RE: Vincent [name]  Date of Birth: [birth year]  Date: 03/29/2023    To Whom It May Concern:   Please be advised that the above-named patient will require a short-term nursing home stay - anticipated 30 days or less for rehabilitation and strengthening. The plan is for return home.

## 2023-03-29 NOTE — TOC Initial Note (Signed)
Transition of Care [facility]) - Initial/Assessment Note    Patient Details  Name: Vincent [name] MRN: [id number] Date of Birth: [birth year]  Transition of Care [facility]) CM/SW Contact:    Susa Simmonds, LCSWA Phone Number: 03/29/2023, 7:05 PM  Clinical Narrative: CSW spoke with patient who is agreeable to SNF placement if approved. Patient states he lives with his sister and wants to speak with his sister renee about going to SNF. Patient gave CSW permission to reach out to his sister [name] [phone]. Renee stated she is fine with patient going to SNF. Patient was previously at [facility] in November 2024. Patient initially was recommended for acute inpatient rehab. CIR reviewed patient and denied admission. CSW will start SNF bed search.     Expected Discharge Plan: Skilled Nursing Facility Barriers to Discharge: Continued Medical Work up   Patient Goals and CMS Choice Patient states their goals for this hospitalization and ongoing recovery are:: Skilled nursing          Expected Discharge Plan and Services                                              Prior Living Arrangements/Services   Lives with:: Siblings Patient language and need for interpreter reviewed:: No Do you feel safe going back to the place where you live?: Yes      Need for Family Participation in Patient Care: Yes (Comment) Care giver support system in place?: Yes (comment)   Criminal Activity/Legal Involvement Pertinent to Current Situation/Hospitalization: No - Comment as needed  Activities of Daily Living      Permission Sought/Granted      Share Information with NAME: Dionicio Stall     Permission granted to share info w Relationship: Sister  Permission granted to share info w Contact Information: [phone]  Emotional Assessment   Attitude/Demeanor/Rapport: Engaged Affect (typically observed): Calm Orientation: : Oriented to Self, Oriented to Place, Oriented to  Time,  Oriented to Situation Alcohol / Substance Use: Not Applicable Psych Involvement: No (comment)  Admission diagnosis:  Edema Patient Active Problem List   Diagnosis Date Noted   Abscess, jaw 03/25/2023   Facial abscess 03/24/2023   Hx of abdominal abscess 01/18/2023   Sepsis with acute renal failure (HCC) 09/25/2022   Abscess, gluteal, right 09/25/2022   Peripheral neuropathy in hands 06/08/2022   Essential hypertension 05/23/2022   BPH (benign prostatic hyperplasia) 05/23/2022   AKI (acute kidney injury) (HCC) 09/04/2021   Hypotension 09/03/2021   History of seizure 08/02/2021   Cerebral thrombosis with cerebral infarction 11/16/2020   Ischemic stroke (HCC) 06/10/2020   Pain due to onychomycosis of toenails of both feet 01/28/2020   Hyperglycemia due to type 2 diabetes mellitus (HCC) 01/07/2020   Resistance to insulin 01/07/2020   Right leg weakness 04/24/2019   Right leg pain 04/24/2019   Gait abnormality 04/24/2019   TIA (transient ischemic attack) 10/20/2018   DM2 (diabetes mellitus, type 2) (HCC) 10/19/2018   Left sided numbness 10/19/2018   Diabetic autonomic neuropathy associated with type 2 diabetes mellitus (HCC) 02/01/2018   DM type 2 with diabetic peripheral neuropathy (HCC) 02/01/2018   Dyslipidemia 01/31/2018   Coronary artery disease involving native coronary artery of native heart without angina pectoris 01/31/2018   Chest pain 01/01/2018   Breakthrough seizure (HCC) 12/19/2016   Essential tremor 12/19/2016   Chronic post-traumatic stress  disorder (PTSD) 06/16/2016   Tremor 06/16/2016   Tobacco abuse 05/05/2016   Altered mental status 05/28/2015   Type 2 diabetes mellitus with hyperglycemia (HCC) 05/28/2015   Hypothyroidism 05/28/2015   Bipolar 2 disorder (HCC) 05/28/2015   History of rheumatoid arthritis 05/28/2015   Chronic pain 05/28/2015   Bipolar II disorder, most recent episode major depressive (HCC) 03/18/2015   Carpal tunnel syndrome on left  02/10/2015   Carpal tunnel syndrome on right 02/10/2015   Diabetes (HCC) 01/23/2015   Obesity (BMI 30-39.9) 03/06/2014   Acute encephalopathy 03/05/2014   Hyperlipidemia 03/05/2014   Anemia, normocytic normochromic 03/05/2014   Altered mental state    Helicobacter pylori (H. pylori) infection 11/20/2012   Protein-calorie malnutrition, severe (HCC) 10/09/2012   Dysphagia 08/19/2012   Gastroparesis 08/19/2012   Bladder tumor 08/08/2012   Biliary dyskinesia 11/02/2010   PCP:  Dois Davenport, MD Pharmacy:   [id number] - [place], [place] - [address] [place] [address] Phone: [phone] Fax: [phone]     Social Drivers of Health (SDOH) Social History: SDOH Screenings   Food Insecurity: No Food Insecurity (01/18/2023)  Housing: Low Risk  (01/18/2023)  Transportation Needs: No Transportation Needs (01/18/2023)  Utilities: Not At Risk (01/18/2023)  Alcohol Screen: Low Risk  (07/31/2021)  Social Connections: Unknown (07/17/2021)   [facility] from [facility]  Tobacco Use: High Risk (03/28/2023)   SDOH Interventions:     Readmission Risk Interventions    01/22/2023   11:58 AM 05/26/2022    1:26 PM  Readmission Risk Prevention Plan  Transportation Screening Complete   PCP or Specialist Appt within 3-5 Days Complete Complete  HRI or Home Care Consult Complete Complete  Social Work Consult for Recovery Care Planning/Counseling Complete Complete  Palliative Care Screening Not Applicable Not Applicable  Medication Review Oceanographer) Referral to Pharmacy Complete

## 2023-03-29 NOTE — ED Notes (Signed)
Breakfast tray ordered 

## 2023-03-29 NOTE — ED Notes (Signed)
Attempted IV x2 with no success. [place] Iv requested.

## 2023-03-29 NOTE — Discharge Planning (Signed)
RNCM consulted in regards to medication assistance.  Pt has Engineer, maintenance (IT) coverage and is not eligible for Medication Assistance Through [employer] [facility]) program. RNCM suggests sending Rx to  [facility]  to fill and bring to pt at bedside prior to discharge. No further CM needs communicated at this time.    Pt currently active with [facility] for Home Health services RN, PT, OT, and MSW as confirmed by [facility] with Elnita Maxwell of [facility].  Pt will resume HH services with [facility]. No  DME needs identified at this time.

## 2023-03-29 NOTE — Evaluation (Signed)
Occupational Therapy Evaluation Patient Details Name: Vincent [name] MRN: [id number] DOB: [birth year] Today's Date: 03/29/2023   History of Present Illness Pt is a 58 y.o. male who presented 03/27/23 with bil leg edema. Was in ED on 1/11 for facial abscess that were drained and D/c'd 1/12. PMHx: DM, peripheral neuropathy, GAD, chronic pain syndrome, bipolar 2 disorder, PTSD, RA, CAD, COPD, GERD, h/o bladder cancer and gastroparesis.   Clinical Impression   This 58 yo male admitted with above presents to acute OT with PLOF over the last couple of months needing increased A from sister for BADLs and IADLs due to patient unable to ambulate or transfer himself independently due to decreased strength and increased pain in legs. He currently is setup-total A for basic ADLs and min-mod A +2 for all mobility. He will continue to benefit from acute OT with follow up from intensive inpatient follow up therapy, >3 hours/day currently recommended. There were some discrepancies in how pt was moving when it came to getting off and onto stretcher (had more trouble getting legs off of stretcher than on) and same for standing and side stepping (he has more trouble standing up as far as getting knees straight than side stepping where he kept knees straight and not buckling and was able to advance his feet without A)        If plan is discharge home, recommend the following: A lot of help with walking and/or transfers;A lot of help with bathing/dressing/bathroom;Assistance with cooking/housework;Assist for transportation;Help with stairs or ramp for entrance    Functional Status Assessment  Patient has had a recent decline in their functional status and demonstrates the ability to make significant improvements in function in a reasonable and predictable amount of time.  Equipment Recommendations  Other (comment) (TBD next venue)    Recommendations for Other Services Rehab consult     Precautions / Restrictions  Precautions Precautions: Fall Restrictions Weight Bearing Restrictions Per Provider Order: No      Mobility Bed Mobility Overal bed mobility: Needs Assistance Bed Mobility: Sidelying to Sit, Sit to Sidelying   Sidelying to sit: Mod assist, +2 for physical assistance, HOB elevated     Sit to sidelying: Min assist General bed mobility comments: A for legs OOB and into bed    Transfers Overall transfer level: Needs assistance Equipment used: Rolling walker (2 wheels) Transfers: Sit to/from Stand, Bed to chair/wheelchair/BSC Sit to Stand: Mod assist, +2 physical assistance           General transfer comment: For sit>stand from stretcher first attempt pt could not get knees straigh, on 2nd attempt his he was able to get knees straight and then he took steps up towards the head of the strecher with min A +2 (knees did not buckle and was able to advance his legs without A)---where as sitting on the edge of the strecher he displayed more strength in RLE but could barely kick out LLE and could not raise his knee (raise foot vertically off the floor) at all      Balance Overall balance assessment: Needs assistance Sitting-balance support: No upper extremity supported, Feet supported Sitting balance-Leahy Scale: Good     Standing balance support: Bilateral upper extremity supported, Reliant on assistive device for balance Standing balance-Leahy Scale: Poor                             ADL either performed or assessed with clinical judgement  ADL Overall ADL's : Needs assistance/impaired Eating/Feeding: Independent;Bed level   Grooming: Set up;Sitting Grooming Details (indicate cue type and reason): EOB Upper Body Bathing: Set up;Sitting Upper Body Bathing Details (indicate cue type and reason): EOB Lower Body Bathing: Total assistance;Bed level   Upper Body Dressing : Set up;Sitting Upper Body Dressing Details (indicate cue type and reason): EOB Lower Body  Dressing: Total assistance;Bed level   Toilet Transfer: Moderate assistance;+2 for physical assistance;+2 for safety/equipment Toilet Transfer Details (indicate cue type and reason): side step up towards head of stretcher Toileting- Clothing Manipulation and Hygiene: Total assistance Toileting - Clothing Manipulation Details (indicate cue type and reason): Mod A +2 sit<>stand             Vision Patient Visual Report: No change from baseline              Pertinent Vitals/Pain Pain Assessment Pain Assessment: Faces Pain Score: 7  Faces Pain Scale: Hurts little more Pain Location: both legs Pain Descriptors / Indicators: Dull, Constant Pain Intervention(s): Limited activity within patient's tolerance, Monitored during session, Repositioned     Extremity/Trunk Assessment Upper Extremity Assessment Upper Extremity Assessment: Overall WFL for tasks assessed           Communication Communication Communication: No apparent difficulties   Cognition Arousal: Alert Behavior During Therapy: Flat affect Overall Cognitive Status: Within Functional Limits for tasks assessed                                 General Comments: a bit perturbed we were making him move                Home Living Family/patient expects to be discharged to:: Private residence Living Arrangements: Other relatives (sister) Available Help at Discharge: Family Type of Home: Mobile home Home Access: Stairs to enter Entrance Stairs-Number of Steps: 5 Entrance Stairs-Rails: Left;Right Home Layout: One level     Bathroom Shower/Tub: Walk-in shower;Tub/shower unit         Home Equipment: Agricultural consultant (2 wheels);Rollator (4 wheels);Shower seat;Cane - single point;BSC/3in1   Additional Comments: sister lives with pt and has been assisting him with getting off of couch onto rollator and pushing him to bathroom for toileting and showering, Per patient he has basically been lying on the  couch      Prior Functioning/Environment Prior Level of Function : History of Falls (last six months);Independent/Modified Independent             Mobility Comments: mod I with use of rollator at all times, reports significant fall history but that has decreased in recent 2 months per pt--because he has not been walking ADLs Comments: Sister A with all BADLs        OT Problem List: Decreased strength;Decreased range of motion;Impaired balance (sitting and/or standing);Pain      OT Treatment/Interventions: Self-care/ADL training;Balance training;Therapeutic activities;DME and/or AE instruction;Patient/family education    OT Goals(Current goals can be found in the care plan section) Acute Rehab OT Goals Patient Stated Goal: agreeable to rehab but commented "I hope they do better than the last place I was" OT Goal Formulation: With patient Time For Goal Achievement: 04/12/23 Potential to Achieve Goals: Good  OT Frequency: Min 1X/week    Co-evaluation PT/OT/SLP Co-Evaluation/Treatment: Yes (partial) Reason for Co-Treatment: For patient/therapist safety;To address functional/ADL transfers PT goals addressed during session: Mobility/safety with mobility;Balance;Proper use of DME;Strengthening/ROM OT goals addressed during session: Strengthening/ROM;ADL's and  self-care      AM-PAC OT "6 Clicks" Daily Activity     Outcome Measure Help from another person eating meals?: None Help from another person taking care of personal grooming?: A Little Help from another person toileting, which includes using toliet, bedpan, or urinal?: A Lot Help from another person bathing (including washing, rinsing, drying)?: A Lot Help from another person to put on and taking off regular upper body clothing?: A Little Help from another person to put on and taking off regular lower body clothing?: Total 6 Click Score: 15   End of Session Equipment Utilized During Treatment: Gait belt;Rolling walker (2  wheels)  Activity Tolerance: Patient tolerated treatment well Patient left: in bed  OT Visit Diagnosis: Unsteadiness on feet (R26.81);Other abnormalities of gait and mobility (R26.89);Muscle weakness (generalized) (M62.81);Pain Pain - Right/Left:  (both) Pain - part of body:  (legs)                Time: 6440-3474 OT Time Calculation (min): 18 min Charges:  OT General Charges $OT Visit: 1 Visit OT Evaluation $OT Eval Moderate Complexity: 1 Mod  Cathy L. OT Acute Rehabilitation Services Office [phone]   Evette Georges 03/29/2023, 1:16 PM

## 2023-03-29 NOTE — Evaluation (Signed)
Physical Therapy Evaluation Patient Details Name: Vincent [name] MRN: [id number] DOB: [birth year] Today's Date: 03/29/2023  History of Present Illness  Pt is a 58 y.o. male who presented 03/27/23 with bil leg edema. Was in ED on 1/11 for facial abscess that were drained and D/c'd 1/12. PMHx: DM, peripheral neuropathy, GAD, chronic pain syndrome, bipolar 2 disorder, PTSD, RA, CAD, COPD, GERD, h/o bladder cancer and gastroparesis.   Clinical Impression  Pt presents with condition above and deficits mentioned below, see PT Problem List. PTA, pt was living with his sister in a mobile home with 5 STE. At baseline, pt is mod I with use of rollator at all times. However, when his edema and weakness in his legs began ~2 weeks ago, he had increased difficulty mobilizing. He has not ambulated since symptom onset and has been reliant on his sister to help him to stand and sit on his rollator to move around the house the past couple weeks. Currently, pt demonstrates deficits in bil lower extremity strength with his L leg being weaker than his R. His ability to maintain knee stability and not allow it to buckle when standing varied upon each rep in standing. He is currently requiring up to modAx2 for bed mobility and transfers and minAx2 to take a couple lateral steps along EOB with RW support. As pt has had a drastic functional decline and desires to return to being mod I, he could greatly benefit from intensive inpatient rehab, > 3 hours/day. Will continue to follow acutely.         If plan is discharge home, recommend the following: Two people to help with walking and/or transfers;A lot of help with bathing/dressing/bathroom;Assistance with cooking/housework;Assist for transportation;Help with stairs or ramp for entrance   Can travel by private vehicle        Equipment Recommendations Rolling walker (2 wheels);BSC/3in1;Wheelchair (measurements PT);Wheelchair cushion (measurements PT);Hospital bed   Recommendations for Other Services  Rehab consult    Functional Status Assessment Patient has had a recent decline in their functional status and demonstrates the ability to make significant improvements in function in a reasonable and predictable amount of time.     Precautions / Restrictions Precautions Precautions: Fall Restrictions Weight Bearing Restrictions Per Provider Order: No      Mobility  Bed Mobility Overal bed mobility: Needs Assistance Bed Mobility: Sidelying to Sit, Sit to Sidelying   Sidelying to sit: Mod assist, +2 for physical assistance, HOB elevated     Sit to sidelying: Mod assist, +2 for physical assistance General bed mobility comments: Pt needed assistance to manage each leg on and off the bed. Cues and assistance required at pt's trunk to ascend to sit up EOB.    Transfers Overall transfer level: Needs assistance Equipment used: Rolling walker (2 wheels) Transfers: Sit to/from Stand Sit to Stand: Mod assist, +2 physical assistance           General transfer comment: R foot blocked due to it sliding anteriorly with the first rep to stand. Pt required modAx2 to power pt up to stand, extend his hips, and assist him in gaining his balance, x2 reps. Knee buckling noted the first rep, but better knee stability noted the second rep.    Ambulation/Gait Ambulation/Gait assistance: +2 physical assistance, +2 safety/equipment, Min assist Gait Distance (Feet): 2 Feet Assistive device: Rolling walker (2 wheels) Gait Pattern/deviations: Decreased step length - right, Decreased step length - left, Decreased stride length, Knees buckling, Trunk flexed Gait velocity: reduced Gait  velocity interpretation: <1.31 ft/sec, indicative of household ambulator   General Gait Details: Pt initially displayed knee buckling the first time he stood, worse at his L knee than his R. However, his knee stability was better with the second rep and no buckling was appreciated  when he stepped laterally up EOB to L towards HOB, minAx2 needed for balance and safety and cuing pt to sequence feet and RW.  Stairs            Wheelchair Mobility     Tilt Bed    Modified Rankin (Stroke Patients Only)       Balance Overall balance assessment: Needs assistance Sitting-balance support: No upper extremity supported, Feet supported Sitting balance-Leahy Scale: Good     Standing balance support: Bilateral upper extremity supported, Reliant on assistive device for balance Standing balance-Leahy Scale: Poor Standing balance comment: reliant on RW and min-modAx2                             Pertinent Vitals/Pain Pain Assessment Pain Assessment: 0-10 Pain Score: 7  Pain Location: both legs Pain Descriptors / Indicators: Dull, Constant Pain Intervention(s): Limited activity within patient's tolerance, Monitored during session, Repositioned    Home Living Family/patient expects to be discharged to:: Private residence Living Arrangements: Other relatives (sister) Available Help at Discharge: Family Type of Home: Mobile home Home Access: Stairs to enter Entrance Stairs-Rails: Lawyer of Steps: 5   Home Layout: One level Home Equipment: Rollator (4 wheels) Additional Comments: sister lives with pt and has been assisting him with getting off of couch onto rollator and pushing him to bathroom for toileting and showering, Per patient he has basically been lying on the couch    Prior Function Prior Level of Function : History of Falls (last six months);Independent/Modified Independent             Mobility Comments: mod I with use of rollator at all times, reports significant fall history but that has decreased in recent 2 months per pt--because he has not been walking; has not walked in x2 weeks; sister has been helping him to stand and sit on his rollator to move around the house the past couple weeks ADLs Comments:  Sister A with all BADLs     Extremity/Trunk Assessment   Upper Extremity Assessment Upper Extremity Assessment: Defer to OT evaluation    Lower Extremity Assessment Lower Extremity Assessment: RLE deficits/detail;LLE deficits/detail RLE Deficits / Details: gross weakness, weaker on L than on R with MMT scores of 3+ hip flexion, 4 knee extension on R; heel blister popped per chart, ACE wrap at heel currently LLE Deficits / Details: gross weakness noted, weaker on L than on R with MMT scores of 2+ to 3- grossly; heel blister noted    Cervical / Trunk Assessment Cervical / Trunk Assessment: Normal  Communication   Communication Communication: No apparent difficulties  Cognition Arousal: Alert Behavior During Therapy: Flat affect Overall Cognitive Status: Within Functional Limits for tasks assessed                                 General Comments: a bit perturbed we were making him move        General Comments      Exercises     Assessment/Plan    PT Assessment Patient needs continued PT services  PT Problem List Decreased  strength;Decreased activity tolerance;Decreased balance;Decreased mobility;Pain       PT Treatment Interventions DME instruction;Gait training;Stair training;Functional mobility training;Therapeutic activities;Therapeutic exercise;Balance training;Neuromuscular re-education;Patient/family education    PT Goals (Current goals can be found in the Care Plan section)  Acute Rehab PT Goals Patient Stated Goal: to improve PT Goal Formulation: With patient Time For Goal Achievement: 04/12/23 Potential to Achieve Goals: Good    Frequency Min 1X/week     Co-evaluation PT/OT/SLP Co-Evaluation/Treatment: Yes Reason for Co-Treatment: For patient/therapist safety;To address functional/ADL transfers PT goals addressed during session: Mobility/safety with mobility;Balance;Proper use of DME OT goals addressed during session:  Strengthening/ROM;ADL's and self-care       AM-PAC PT "6 Clicks" Mobility  Outcome Measure Help needed turning from your back to your side while in a flat bed without using bedrails?: A Lot Help needed moving from lying on your back to sitting on the side of a flat bed without using bedrails?: Total Help needed moving to and from a bed to a chair (including a wheelchair)?: Total Help needed standing up from a chair using your arms (e.g., wheelchair or bedside chair)?: Total Help needed to walk in hospital room?: Total Help needed climbing 3-5 steps with a railing? : Total 6 Click Score: 7    End of Session Equipment Utilized During Treatment: Gait belt Activity Tolerance: Patient tolerated treatment well Patient left: in bed   PT Visit Diagnosis: Unsteadiness on feet (R26.81);Other abnormalities of gait and mobility (R26.89);Muscle weakness (generalized) (M62.81);Difficulty in walking, not elsewhere classified (R26.2);Pain Pain - Right/Left:  (bil) Pain - part of body: Leg    Time: 1191-4782 PT Time Calculation (min) (ACUTE ONLY): 21 min   Charges:   PT Evaluation $PT Eval Moderate Complexity: 1 Mod   PT General Charges $$ ACUTE PT VISIT: 1 Visit         Virgil Benedict, PT, DPT Acute Rehabilitation Services  Office: [phone]   Bettina Gavia 03/29/2023, 1:28 PM

## 2023-03-30 ENCOUNTER — Encounter (HOSPITAL_COMMUNITY): Payer: Self-pay

## 2023-03-30 LAB — CBG MONITORING, ED
Glucose-Capillary: 91 mg/dL (ref 70–99)
Glucose-Capillary: 120 mg/dL — ABNORMAL HIGH (ref 70–99)
Glucose-Capillary: 85 mg/dL (ref 70–99)
Glucose-Capillary: 136 mg/dL — ABNORMAL HIGH (ref 70–99)
Glucose-Capillary: 166 mg/dL — ABNORMAL HIGH (ref 70–99)

## 2023-03-30 NOTE — ED Provider Notes (Signed)
Emergency Medicine Observation Re-evaluation Note  Vincent [name] is a 57 y.o. male, seen on rounds today.  Pt initially presented to the ED for complaints of Leg Swelling Currently, the patient is resting.  Physical Exam  BP 118/62 (BP Location: Left Arm)   Pulse 70   Temp 99.4 F (37.4 C) (Oral)   Resp 16   Ht 6' (1.829 m)   Wt 70.9 kg   SpO2 99%   BMI 21.20 kg/m  Physical Exam General: NAD   ED Course / MDM  EKG:   I have reviewed the labs performed to date as well as medications administered while in observation.  Recent changes in the last 24 hours include no acute events reported.  Plan  Current plan is for SNF placement.    Wynetta Fines, MD 03/30/23 [phone]

## 2023-03-30 NOTE — ED Notes (Signed)
Dressing changed on R foot from stage 3 pressure ulcer. Scant serousanginous drainage. Redressed with telfa, ABD pad, kerlix, and ace wrap. L foot dressed due to beginning stages of blister formation. Dressed with telfa, ABD pad, kerlix.

## 2023-03-30 NOTE — Progress Notes (Addendum)
12:30pm: CSW spoke with patient to present him with bed offers. Patient agreeable to accept bed offer from [facility].  CSW notified Kia at [facility] of information.  CSW will initiate insurance authorization.  9:55am: CSW faxed patient's clinicals out to obtain bed offers for STR at SNF.  Patient's PASRR is [id number].  Edwin Dada, MSW, LCSW Transitions of Care  Clinical Social Worker II [phone]

## 2023-03-30 NOTE — ED Notes (Signed)
BGL 126

## 2023-03-31 LAB — CBG MONITORING, ED
Glucose-Capillary: 121 mg/dL — ABNORMAL HIGH (ref 70–99)
Glucose-Capillary: 117 mg/dL — ABNORMAL HIGH (ref 70–99)

## 2023-03-31 NOTE — ED Notes (Signed)
Report was given to nurse at the SNF

## 2023-03-31 NOTE — Progress Notes (Addendum)
10:10am: CSW spoke with Marylene Land at [facility] who states patient can be accepted at the facility today.  Patient will go to [employer], room 120B via [facility]AR - RN to call when ready. The number for report is [phone].  9:45am: Patient's insurance authorization has been approved #[id number], next review date is 04/03/23.  CSW attempted to reach Kia at [facility] without success.  Edwin Dada, MSW, LCSW Transitions of Care  Clinical Social Worker II [phone]

## 2023-03-31 NOTE — ED Provider Notes (Signed)
Emergency Medicine Observation Re-evaluation Note  Vincent [name] is a 58 y.o. male, seen on rounds today.  Pt initially presented to the ED for complaints of Leg Swelling Currently, the patient is accepted for SNF placement at [facility] health care  Physical Exam  BP (!) 138/59 (BP Location: Left Arm)   Pulse 71   Temp 97.7 F (36.5 C) (Oral)   Resp 18   Ht 6' (1.829 m)   Wt 70.9 kg   SpO2 100%   BMI 21.20 kg/m  Physical Exam General: No acute distress clear mental status Cardiac: There are no rub or gallop Lungs: Symmetric breath sounds occasional wheeze Psych: clear Mental status situationally appropriate musculoskeletal: Patient has bilateral pressure sores to his heels.  1 was debrided due to a ruptured bullae.  Other 1 has bullae intact.  No cellulitis or appearance of secondary infection.  No significant lower extremity edema.  Patient has general muscular atrophy.     ED Course / MDM  EKG:   I have reviewed the labs performed to date as well as medications administered while in observation.  Recent changes in the last 24 hours include wound care.  Plan  Current plan is for placement at [facility] health care.  Patient clearly has had persistent pressure on the heels.  He will require management for pressure wounds and local wound care.  Patient reports that he is essentially nonambulatory currently and thus always has weight on his heels in a supine position.  In the emergency department he has had local wound care.  He has been accepted for rehab for nonambulatory state with pressure wounds.  Stable for transfer.    Arby Barrette, MD 03/31/23 1300

## 2023-04-30 ENCOUNTER — Emergency Department (HOSPITAL_COMMUNITY): Payer: 59

## 2023-04-30 ENCOUNTER — Inpatient Hospital Stay (HOSPITAL_COMMUNITY): Payer: 59

## 2023-04-30 ENCOUNTER — Inpatient Hospital Stay (HOSPITAL_COMMUNITY)
Admission: EM | Admit: 2023-04-30 | Discharge: 2023-05-12 | DRG: 208 | Disposition: E | Payer: 59 | Attending: Critical Care Medicine | Admitting: Critical Care Medicine

## 2023-04-30 ENCOUNTER — Encounter (HOSPITAL_COMMUNITY): Payer: Self-pay

## 2023-04-30 ENCOUNTER — Other Ambulatory Visit: Payer: Self-pay

## 2023-04-30 DIAGNOSIS — M549 Dorsalgia, unspecified: Secondary | ICD-10-CM | POA: Diagnosis present

## 2023-04-30 DIAGNOSIS — Z87442 Personal history of urinary calculi: Secondary | ICD-10-CM

## 2023-04-30 DIAGNOSIS — F3181 Bipolar II disorder: Secondary | ICD-10-CM | POA: Diagnosis present

## 2023-04-30 DIAGNOSIS — N170 Acute kidney failure with tubular necrosis: Secondary | ICD-10-CM | POA: Diagnosis present

## 2023-04-30 DIAGNOSIS — J1000 Influenza due to other identified influenza virus with unspecified type of pneumonia: Secondary | ICD-10-CM | POA: Diagnosis present

## 2023-04-30 DIAGNOSIS — Z8619 Personal history of other infectious and parasitic diseases: Secondary | ICD-10-CM

## 2023-04-30 DIAGNOSIS — Z8551 Personal history of malignant neoplasm of bladder: Secondary | ICD-10-CM

## 2023-04-30 DIAGNOSIS — I469 Cardiac arrest, cause unspecified: Principal | ICD-10-CM | POA: Diagnosis present

## 2023-04-30 DIAGNOSIS — E872 Acidosis, unspecified: Secondary | ICD-10-CM | POA: Diagnosis not present

## 2023-04-30 DIAGNOSIS — Z515 Encounter for palliative care: Secondary | ICD-10-CM | POA: Diagnosis not present

## 2023-04-30 DIAGNOSIS — J189 Pneumonia, unspecified organism: Secondary | ICD-10-CM

## 2023-04-30 DIAGNOSIS — E1165 Type 2 diabetes mellitus with hyperglycemia: Secondary | ICD-10-CM | POA: Diagnosis not present

## 2023-04-30 DIAGNOSIS — I468 Cardiac arrest due to other underlying condition: Secondary | ICD-10-CM | POA: Diagnosis present

## 2023-04-30 DIAGNOSIS — D649 Anemia, unspecified: Secondary | ICD-10-CM | POA: Diagnosis present

## 2023-04-30 DIAGNOSIS — T17998A Other foreign object in respiratory tract, part unspecified causing other injury, initial encounter: Secondary | ICD-10-CM | POA: Diagnosis present

## 2023-04-30 DIAGNOSIS — R579 Shock, unspecified: Secondary | ICD-10-CM

## 2023-04-30 DIAGNOSIS — T83091A Other mechanical complication of indwelling urethral catheter, initial encounter: Secondary | ICD-10-CM | POA: Diagnosis not present

## 2023-04-30 DIAGNOSIS — M069 Rheumatoid arthritis, unspecified: Secondary | ICD-10-CM | POA: Diagnosis present

## 2023-04-30 DIAGNOSIS — K219 Gastro-esophageal reflux disease without esophagitis: Secondary | ICD-10-CM | POA: Diagnosis not present

## 2023-04-30 DIAGNOSIS — T68XXXA Hypothermia, initial encounter: Secondary | ICD-10-CM

## 2023-04-30 DIAGNOSIS — N179 Acute kidney failure, unspecified: Secondary | ICD-10-CM

## 2023-04-30 DIAGNOSIS — I2510 Atherosclerotic heart disease of native coronary artery without angina pectoris: Secondary | ICD-10-CM | POA: Diagnosis present

## 2023-04-30 DIAGNOSIS — G709 Myoneural disorder, unspecified: Secondary | ICD-10-CM | POA: Diagnosis present

## 2023-04-30 DIAGNOSIS — J440 Chronic obstructive pulmonary disease with acute lower respiratory infection: Secondary | ICD-10-CM | POA: Diagnosis present

## 2023-04-30 DIAGNOSIS — E876 Hypokalemia: Secondary | ICD-10-CM | POA: Diagnosis present

## 2023-04-30 DIAGNOSIS — T17890A Other foreign object in other parts of respiratory tract causing asphyxiation, initial encounter: Secondary | ICD-10-CM | POA: Diagnosis present

## 2023-04-30 DIAGNOSIS — L89610 Pressure ulcer of right heel, unstageable: Secondary | ICD-10-CM | POA: Diagnosis present

## 2023-04-30 DIAGNOSIS — Z8711 Personal history of peptic ulcer disease: Secondary | ICD-10-CM

## 2023-04-30 DIAGNOSIS — Z811 Family history of alcohol abuse and dependence: Secondary | ICD-10-CM

## 2023-04-30 DIAGNOSIS — E039 Hypothyroidism, unspecified: Secondary | ICD-10-CM | POA: Diagnosis present

## 2023-04-30 DIAGNOSIS — Z9221 Personal history of antineoplastic chemotherapy: Secondary | ICD-10-CM

## 2023-04-30 DIAGNOSIS — E1142 Type 2 diabetes mellitus with diabetic polyneuropathy: Secondary | ICD-10-CM | POA: Diagnosis present

## 2023-04-30 DIAGNOSIS — J9602 Acute respiratory failure with hypercapnia: Secondary | ICD-10-CM | POA: Diagnosis present

## 2023-04-30 DIAGNOSIS — Z794 Long term (current) use of insulin: Secondary | ICD-10-CM

## 2023-04-30 DIAGNOSIS — Z882 Allergy status to sulfonamides status: Secondary | ICD-10-CM

## 2023-04-30 DIAGNOSIS — Z833 Family history of diabetes mellitus: Secondary | ICD-10-CM

## 2023-04-30 DIAGNOSIS — Z7189 Other specified counseling: Secondary | ICD-10-CM | POA: Diagnosis not present

## 2023-04-30 DIAGNOSIS — Z66 Do not resuscitate: Secondary | ICD-10-CM | POA: Diagnosis not present

## 2023-04-30 DIAGNOSIS — Z87820 Personal history of traumatic brain injury: Secondary | ICD-10-CM

## 2023-04-30 DIAGNOSIS — Z1152 Encounter for screening for COVID-19: Secondary | ICD-10-CM

## 2023-04-30 DIAGNOSIS — A419 Sepsis, unspecified organism: Secondary | ICD-10-CM | POA: Diagnosis present

## 2023-04-30 DIAGNOSIS — Z888 Allergy status to other drugs, medicaments and biological substances status: Secondary | ICD-10-CM

## 2023-04-30 DIAGNOSIS — G9341 Metabolic encephalopathy: Secondary | ICD-10-CM | POA: Diagnosis present

## 2023-04-30 DIAGNOSIS — G2581 Restless legs syndrome: Secondary | ICD-10-CM | POA: Diagnosis present

## 2023-04-30 DIAGNOSIS — J690 Pneumonitis due to inhalation of food and vomit: Secondary | ICD-10-CM | POA: Diagnosis present

## 2023-04-30 DIAGNOSIS — Z8249 Family history of ischemic heart disease and other diseases of the circulatory system: Secondary | ICD-10-CM

## 2023-04-30 DIAGNOSIS — I10 Essential (primary) hypertension: Secondary | ICD-10-CM | POA: Diagnosis present

## 2023-04-30 DIAGNOSIS — N312 Flaccid neuropathic bladder, not elsewhere classified: Secondary | ICD-10-CM | POA: Diagnosis present

## 2023-04-30 DIAGNOSIS — E785 Hyperlipidemia, unspecified: Secondary | ICD-10-CM | POA: Diagnosis present

## 2023-04-30 DIAGNOSIS — E1143 Type 2 diabetes mellitus with diabetic autonomic (poly)neuropathy: Secondary | ICD-10-CM | POA: Diagnosis present

## 2023-04-30 DIAGNOSIS — L89156 Pressure-induced deep tissue damage of sacral region: Secondary | ICD-10-CM | POA: Diagnosis present

## 2023-04-30 DIAGNOSIS — R1011 Right upper quadrant pain: Secondary | ICD-10-CM

## 2023-04-30 DIAGNOSIS — Y738 Miscellaneous gastroenterology and urology devices associated with adverse incidents, not elsewhere classified: Secondary | ICD-10-CM | POA: Diagnosis not present

## 2023-04-30 DIAGNOSIS — R6521 Severe sepsis with septic shock: Secondary | ICD-10-CM | POA: Diagnosis present

## 2023-04-30 DIAGNOSIS — G894 Chronic pain syndrome: Secondary | ICD-10-CM | POA: Diagnosis present

## 2023-04-30 DIAGNOSIS — J09X1 Influenza due to identified novel influenza A virus with pneumonia: Secondary | ICD-10-CM | POA: Diagnosis not present

## 2023-04-30 DIAGNOSIS — Z6824 Body mass index (BMI) 24.0-24.9, adult: Secondary | ICD-10-CM

## 2023-04-30 DIAGNOSIS — Z885 Allergy status to narcotic agent status: Secondary | ICD-10-CM

## 2023-04-30 DIAGNOSIS — Z7902 Long term (current) use of antithrombotics/antiplatelets: Secondary | ICD-10-CM

## 2023-04-30 DIAGNOSIS — J9601 Acute respiratory failure with hypoxia: Secondary | ICD-10-CM | POA: Diagnosis present

## 2023-04-30 DIAGNOSIS — R451 Restlessness and agitation: Secondary | ICD-10-CM

## 2023-04-30 DIAGNOSIS — I69398 Other sequelae of cerebral infarction: Secondary | ICD-10-CM | POA: Diagnosis not present

## 2023-04-30 DIAGNOSIS — K3184 Gastroparesis: Secondary | ICD-10-CM | POA: Diagnosis present

## 2023-04-30 DIAGNOSIS — F17210 Nicotine dependence, cigarettes, uncomplicated: Secondary | ICD-10-CM | POA: Diagnosis present

## 2023-04-30 DIAGNOSIS — Z9151 Personal history of suicidal behavior: Secondary | ICD-10-CM

## 2023-04-30 DIAGNOSIS — E11649 Type 2 diabetes mellitus with hypoglycemia without coma: Secondary | ICD-10-CM | POA: Diagnosis not present

## 2023-04-30 DIAGNOSIS — G4733 Obstructive sleep apnea (adult) (pediatric): Secondary | ICD-10-CM | POA: Diagnosis present

## 2023-04-30 DIAGNOSIS — L89626 Pressure-induced deep tissue damage of left heel: Secondary | ICD-10-CM | POA: Diagnosis present

## 2023-04-30 DIAGNOSIS — Z781 Physical restraint status: Secondary | ICD-10-CM

## 2023-04-30 DIAGNOSIS — Z8701 Personal history of pneumonia (recurrent): Secondary | ICD-10-CM

## 2023-04-30 DIAGNOSIS — M51369 Other intervertebral disc degeneration, lumbar region without mention of lumbar back pain or lower extremity pain: Secondary | ICD-10-CM | POA: Diagnosis present

## 2023-04-30 DIAGNOSIS — Z79899 Other long term (current) drug therapy: Secondary | ICD-10-CM

## 2023-04-30 DIAGNOSIS — I480 Paroxysmal atrial fibrillation: Secondary | ICD-10-CM | POA: Diagnosis not present

## 2023-04-30 DIAGNOSIS — F4310 Post-traumatic stress disorder, unspecified: Secondary | ICD-10-CM | POA: Diagnosis present

## 2023-04-30 DIAGNOSIS — F419 Anxiety disorder, unspecified: Secondary | ICD-10-CM | POA: Diagnosis present

## 2023-04-30 DIAGNOSIS — R569 Unspecified convulsions: Secondary | ICD-10-CM | POA: Diagnosis not present

## 2023-04-30 DIAGNOSIS — R339 Retention of urine, unspecified: Secondary | ICD-10-CM | POA: Diagnosis present

## 2023-04-30 DIAGNOSIS — E440 Moderate protein-calorie malnutrition: Secondary | ICD-10-CM | POA: Diagnosis present

## 2023-04-30 DIAGNOSIS — Z993 Dependence on wheelchair: Secondary | ICD-10-CM

## 2023-04-30 DIAGNOSIS — G250 Essential tremor: Secondary | ICD-10-CM | POA: Diagnosis present

## 2023-04-30 DIAGNOSIS — I4891 Unspecified atrial fibrillation: Secondary | ICD-10-CM | POA: Diagnosis not present

## 2023-04-30 DIAGNOSIS — R1310 Dysphagia, unspecified: Secondary | ICD-10-CM | POA: Diagnosis not present

## 2023-04-30 LAB — PHOSPHORUS: Phosphorus: 6.4 mg/dL — ABNORMAL HIGH (ref 2.5–4.6)

## 2023-04-30 LAB — APTT
aPTT: UNDETERMINED s (ref 24–36)
aPTT: 48 s — ABNORMAL HIGH (ref 24–36)

## 2023-04-30 LAB — CBC
HCT: UNDETERMINED % (ref 39.0–52.0)
HCT: 26.8 % — ABNORMAL LOW (ref 39.0–52.0)
Hemoglobin: UNDETERMINED g/dL (ref 13.0–17.0)
Hemoglobin: 8.5 g/dL — ABNORMAL LOW (ref 13.0–17.0)
MCH: UNDETERMINED pg (ref 26.0–34.0)
MCH: 26.4 pg (ref 26.0–34.0)
MCHC: UNDETERMINED g/dL (ref 30.0–36.0)
MCHC: 31.7 g/dL (ref 30.0–36.0)
MCV: UNDETERMINED fL (ref 80.0–100.0)
MCV: 83.2 fL (ref 80.0–100.0)
Platelets: UNDETERMINED 10*3/uL (ref 150–400)
Platelets: 444 10*3/uL — ABNORMAL HIGH (ref 150–400)
RBC: UNDETERMINED MIL/uL (ref 4.22–5.81)
RBC: 3.22 MIL/uL — ABNORMAL LOW (ref 4.22–5.81)
RDW: UNDETERMINED % (ref 11.5–15.5)
RDW: 13.7 % (ref 11.5–15.5)
WBC: UNDETERMINED 10*3/uL (ref 4.0–10.5)
WBC: 37 10*3/uL — ABNORMAL HIGH (ref 4.0–10.5)
nRBC: UNDETERMINED % (ref 0.0–0.2)
nRBC: 0.1 % (ref 0.0–0.2)

## 2023-04-30 LAB — I-STAT CHEM 8, ED
BUN: 16 mg/dL (ref 6–20)
Calcium, Ion: 1.11 mmol/L — ABNORMAL LOW (ref 1.15–1.40)
Chloride: 108 mmol/L (ref 98–111)
Creatinine, Ser: 1 mg/dL (ref 0.61–1.24)
Glucose, Bld: 523 mg/dL (ref 70–99)
HCT: 27 % — ABNORMAL LOW (ref 39.0–52.0)
Hemoglobin: 9.2 g/dL — ABNORMAL LOW (ref 13.0–17.0)
Potassium: 3.9 mmol/L (ref 3.5–5.1)
Sodium: 139 mmol/L (ref 135–145)
TCO2: 21 mmol/L — ABNORMAL LOW (ref 22–32)

## 2023-04-30 LAB — COMPREHENSIVE METABOLIC PANEL
ALT: 18 U/L (ref 0–44)
AST: 20 U/L (ref 15–41)
Albumin: 1.7 g/dL — ABNORMAL LOW (ref 3.5–5.0)
Alkaline Phosphatase: 136 U/L — ABNORMAL HIGH (ref 38–126)
Anion gap: 12 (ref 5–15)
BUN: 15 mg/dL (ref 6–20)
CO2: 17 mmol/L — ABNORMAL LOW (ref 22–32)
Calcium: 8 mg/dL — ABNORMAL LOW (ref 8.9–10.3)
Chloride: 110 mmol/L (ref 98–111)
Creatinine, Ser: 1.25 mg/dL — ABNORMAL HIGH (ref 0.61–1.24)
GFR, Estimated: >60 mL/min (ref >60)
Glucose, Bld: 509 mg/dL (ref 70–99)
Potassium: 3.3 mmol/L — ABNORMAL LOW (ref 3.5–5.1)
Sodium: 139 mmol/L (ref 135–145)
Total Bilirubin: 0.5 mg/dL (ref 0.0–1.2)
Total Protein: 5.5 g/dL — ABNORMAL LOW (ref 6.5–8.1)

## 2023-04-30 LAB — TRIGLYCERIDES: Triglycerides: 98 mg/dL (ref <150)

## 2023-04-30 LAB — I-STAT ARTERIAL BLOOD GAS, ED
Acid-base deficit: 8 mmol/L — ABNORMAL HIGH (ref 0.0–2.0)
Bicarbonate: 20.8 mmol/L (ref 20.0–28.0)
Calcium, Ion: 1.24 mmol/L (ref 1.15–1.40)
HCT: 24 % — ABNORMAL LOW (ref 39.0–52.0)
Hemoglobin: 8.2 g/dL — ABNORMAL LOW (ref 13.0–17.0)
O2 Saturation: 100 %
Potassium: 3.2 mmol/L — ABNORMAL LOW (ref 3.5–5.1)
Sodium: 141 mmol/L (ref 135–145)
TCO2: 23 mmol/L (ref 22–32)
pCO2 arterial: 60.3 mm[Hg] — ABNORMAL HIGH (ref 32–48)
pH, Arterial: 7.145 — CL (ref 7.35–7.45)
pO2, Arterial: 239 mm[Hg] — ABNORMAL HIGH (ref 83–108)

## 2023-04-30 LAB — GLUCOSE, CAPILLARY: Glucose-Capillary: 438 mg/dL — ABNORMAL HIGH (ref 70–99)

## 2023-04-30 LAB — PROTIME-INR
INR: UNDETERMINED (ref 0.8–1.2)
INR: 1.3 — ABNORMAL HIGH (ref 0.8–1.2)
Prothrombin Time: UNDETERMINED s (ref 11.4–15.2)
Prothrombin Time: 16.4 s — ABNORMAL HIGH (ref 11.4–15.2)

## 2023-04-30 LAB — PROCALCITONIN: Procalcitonin: 0.42 ng/mL

## 2023-04-30 LAB — RESP PANEL BY RT-PCR (RSV, FLU A&B, COVID)  RVPGX2
Influenza A by PCR: POSITIVE — AB
Influenza B by PCR: NEGATIVE
Resp Syncytial Virus by PCR: NEGATIVE
SARS Coronavirus 2 by RT PCR: NEGATIVE

## 2023-04-30 LAB — I-STAT CG4 LACTIC ACID, ED: Lactic Acid, Venous: 4.1 mmol/L (ref 0.5–1.9)

## 2023-04-30 LAB — TROPONIN I (HIGH SENSITIVITY): Troponin I (High Sensitivity): 13 ng/L (ref <18)

## 2023-04-30 LAB — MAGNESIUM: Magnesium: 1.7 mg/dL (ref 1.7–2.4)

## 2023-04-30 MED ORDER — DOCUSATE SODIUM 50 MG/5ML PO LIQD
100.0000 mg | Freq: Two times a day (BID) | ORAL | Status: DC
  Administered 2023-05-02: 100 mg
  Filled 2023-04-30: qty 10

## 2023-04-30 MED ORDER — PANTOPRAZOLE SODIUM 40 MG IV SOLR
40.0000 mg | Freq: Every day | INTRAVENOUS | Status: DC
  Administered 2023-04-30 – 2023-05-05 (×6): 40 mg via INTRAVENOUS
  Filled 2023-04-30 (×6): qty 10

## 2023-04-30 MED ORDER — POLYETHYLENE GLYCOL 3350 17 G PO PACK
17.0000 g | PACK | Freq: Every day | ORAL | Status: DC
Start: 2023-05-01 — End: 2023-05-02
  Administered 2023-05-02: 17 g
  Filled 2023-04-30: qty 1

## 2023-04-30 MED ORDER — FENTANYL 2500MCG IN NS 250ML (10MCG/ML) PREMIX INFUSION
50.0000 ug/h | INTRAVENOUS | Status: DC
  Administered 2023-04-30: 50 ug/h via INTRAVENOUS
  Administered 2023-05-01: 100 ug/h via INTRAVENOUS
  Filled 2023-04-30 (×2): qty 250

## 2023-04-30 MED ORDER — SODIUM CHLORIDE 0.9 % IV SOLN: INTRAVENOUS | Status: AC | PRN

## 2023-04-30 MED ORDER — POTASSIUM CHLORIDE 10 MEQ/100ML IV SOLN
10.0000 meq | INTRAVENOUS | Status: AC
  Administered 2023-05-01 (×4): 10 meq via INTRAVENOUS
  Filled 2023-04-30 (×4): qty 100

## 2023-04-30 MED ORDER — PROPOFOL 1000 MG/100ML IV EMUL
5.0000 ug/kg/min | INTRAVENOUS | Status: DC
  Administered 2023-04-30: 5 ug/kg/min via INTRAVENOUS
  Filled 2023-04-30: qty 100

## 2023-04-30 MED ORDER — INSULIN ASPART 100 UNIT/ML IJ SOLN: 0.0000 [IU] | INTRAMUSCULAR | Status: DC

## 2023-04-30 MED ORDER — DEXTROSE IN LACTATED RINGERS 5 % IV SOLN: INTRAVENOUS | Status: DC

## 2023-04-30 MED ORDER — SODIUM CHLORIDE 0.9 % IV BOLUS
1000.0000 mL | Freq: Once | INTRAVENOUS | Status: AC
  Administered 2023-04-30: 1000 mL via INTRAVENOUS

## 2023-04-30 MED ORDER — INSULIN REGULAR(HUMAN) IN NACL 100-0.9 UT/100ML-% IV SOLN
INTRAVENOUS | Status: DC
  Administered 2023-05-01: 17 [IU]/h via INTRAVENOUS
  Filled 2023-04-30: qty 100

## 2023-04-30 MED ORDER — DOCUSATE SODIUM 50 MG/5ML PO LIQD: 100.0000 mg | Freq: Two times a day (BID) | ORAL | Status: DC | PRN

## 2023-04-30 MED ORDER — LACTATED RINGERS IV SOLN: INTRAVENOUS | Status: DC

## 2023-04-30 MED ORDER — DEXTROSE 50 % IV SOLN
0.0000 mL | INTRAVENOUS | Status: DC | PRN
  Administered 2023-05-02: 25 mL via INTRAVENOUS
  Filled 2023-04-30 (×2): qty 50

## 2023-04-30 MED ORDER — EPINEPHRINE HCL 5 MG/250ML IV SOLN IN NS
0.5000 ug/min | INTRAVENOUS | Status: DC
  Administered 2023-04-30: 0.5 ug/min via INTRAVENOUS
  Filled 2023-04-30: qty 250

## 2023-04-30 MED ORDER — PIPERACILLIN-TAZOBACTAM 3.375 G IVPB
3.3750 g | Freq: Three times a day (TID) | INTRAVENOUS | Status: DC
  Administered 2023-05-01 – 2023-05-06 (×16): 3.375 g via INTRAVENOUS
  Filled 2023-04-30 (×18): qty 50

## 2023-04-30 MED ORDER — HEPARIN SODIUM (PORCINE) 5000 UNIT/ML IJ SOLN
5000.0000 [IU] | Freq: Three times a day (TID) | INTRAMUSCULAR | Status: DC
  Administered 2023-05-01 – 2023-05-06 (×15): 5000 [IU] via SUBCUTANEOUS
  Filled 2023-04-30 (×15): qty 1

## 2023-04-30 MED ORDER — PIPERACILLIN-TAZOBACTAM 3.375 G IVPB 30 MIN
3.3750 g | Freq: Once | INTRAVENOUS | Status: AC
  Administered 2023-04-30: 3.375 g via INTRAVENOUS
  Filled 2023-04-30: qty 50

## 2023-04-30 MED ORDER — NOREPINEPHRINE 4 MG/250ML-% IV SOLN
0.0000 ug/min | INTRAVENOUS | Status: DC
  Administered 2023-04-30: 2 ug/min via INTRAVENOUS
  Filled 2023-04-30: qty 250

## 2023-04-30 MED ORDER — HYDROCORTISONE SOD SUC (PF) 100 MG IJ SOLR
100.0000 mg | Freq: Two times a day (BID) | INTRAMUSCULAR | Status: DC
  Administered 2023-04-30: 100 mg via INTRAVENOUS
  Filled 2023-04-30 (×2): qty 2

## 2023-04-30 MED ORDER — IOHEXOL 350 MG/ML SOLN
75.0000 mL | Freq: Once | INTRAVENOUS | Status: AC | PRN
  Administered 2023-04-30: 75 mL via INTRAVENOUS

## 2023-04-30 MED ORDER — FENTANYL BOLUS VIA INFUSION
50.0000 ug | INTRAVENOUS | Status: DC | PRN
  Administered 2023-04-30 (×2): 100 ug via INTRAVENOUS
  Administered 2023-05-02: 50 ug via INTRAVENOUS

## 2023-04-30 MED ORDER — EPINEPHRINE 0.1 MG/10ML (10 MCG/ML) SYRINGE FOR IV PUSH (FOR BLOOD PRESSURE SUPPORT)
PREFILLED_SYRINGE | INTRAVENOUS | Status: DC | PRN
  Administered 2023-04-30: 20 ug via INTRAVENOUS

## 2023-04-30 MED ORDER — VANCOMYCIN HCL IN DEXTROSE 1-5 GM/200ML-% IV SOLN
1000.0000 mg | Freq: Two times a day (BID) | INTRAVENOUS | Status: DC
  Administered 2023-05-01 (×2): 1000 mg via INTRAVENOUS
  Filled 2023-04-30 (×2): qty 200

## 2023-04-30 MED ORDER — SODIUM CHLORIDE 0.9 % IV SOLN: INTRAVENOUS | Status: DC | PRN

## 2023-04-30 MED ORDER — POLYETHYLENE GLYCOL 3350 17 G PO PACK: 17.0000 g | PACK | Freq: Every day | ORAL | Status: DC | PRN

## 2023-04-30 MED ORDER — VANCOMYCIN HCL 1750 MG/350ML IV SOLN
1750.0000 mg | Freq: Once | INTRAVENOUS | Status: AC
  Administered 2023-04-30: 1750 mg via INTRAVENOUS
  Filled 2023-04-30: qty 350

## 2023-04-30 NOTE — Progress Notes (Signed)
RT assisted with transport of this pt from ED room to CT and back while on full ventilatory support. Pt tolerated well with SVS and no complications.

## 2023-04-30 NOTE — Progress Notes (Signed)
ED Pharmacy Antibiotic Sign Off An antibiotic consult was received from an ED provider for vancomycin and zosyn per pharmacy dosing for sepsis. A chart review was completed to assess appropriateness.  The following one time order(s) were placed per pharmacy consult:  vancomycin 1750 mg x 1 dose zosyn 3.375g x 1 dose  Further antibiotic and/or antibiotic pharmacy consults should be ordered by the admitting provider if indicated.   Thank you for allowing pharmacy to be a part of this patient's care.   Delmar Landau, PharmD, BCPS 04/30/2023 7:16 PM ED Clinical Pharmacist -  [phone]

## 2023-04-30 NOTE — ED Notes (Signed)
Report called to [place], RN

## 2023-04-30 NOTE — Progress Notes (Signed)
Pharmacy Antibiotic Note  Vincent [name] is a 58 y.o. male admitted on 04/30/2023 with PEA arrest with respiratory infection and concerns for sepsis.  Pharmacy has been consulted for Zosyn/vancomycin dosing.  -Flu A positive, CXR showing ground-glass opacity in the left lower lobe consistent with pneumonia -Blood cultures collected, last 3 MRSA PCRs detected MRSA -Zosyn/Vanc in ED x1 -WBC 37, sCr 1.25 (bl~0.8-1), hypothermic  Plan: -Zosyn 3.375gm IV every 8 hours -Vancomycin 1750mg  IV x1 -Vancomycin 100mg  IV every 12 hours (AUC 525, Vd 0.72, IBW) -Monitor renal function -Follow up signs of clinical improvement, LOT, de-escalation of antibiotics   Height: 6' (182.9 cm) Weight: 81 kg (178 lb 9.2 oz) IBW/kg (Calculated) : 77.6  Temp (24hrs), Avg:88.1 F (31.2 C), Min:86.1 F (30.1 C), Max:90 F (32.2 C)  Recent Labs  Lab 04/30/23 1814 04/30/23 1827 04/30/23 1832 04/30/23 2010  WBC SPECIMEN CONTAMINATED, UNABLE TO PERFORM TEST(S).  --   --  37.0*  CREATININE  --  1.00  --  1.25*  LATICACIDVEN  --   --  4.1*  --     Estimated Creatinine Clearance: 73.3 mL/min (A) (by C-G formula based on SCr of 1.25 mg/dL (H)).    Antimicrobials this admission: Zosyn 2/17 >>  Vancomycin 2/17 >>   Microbiology results: 2/17 BCx:  2/18 MRSA PCR:   Thank you for allowing pharmacy to be a part of this patient's care.  Arabella Merles, PharmD. Clinical Pharmacist 04/30/2023 11:12 PM

## 2023-04-30 NOTE — H&P (Incomplete)
NAME:  Vincent [name], MRN:  [id number], DOB:  Aug 02, 1965, LOS: 0 ADMISSION DATE:  04/30/2023, CONSULTATION DATE:  04/30/23 REFERRING MD:  Dr. Renaye Rakers, CHIEF COMPLAINT:  cardiac arrest   History of Present Illness:  Pt encephalopathic, therefore obtained from EMR review.    82 yoM with extensive PMH as below significant for CAD, COPD, OSA, HTN, HLD, DMT2, GERD, chronic pain, bladder cancer, seizures, bipolar/ PTSD and recently discharged to SNF on 03/31/23 after ER visit for rehab, mostly non-ambulatory with persistent and bilateral heel pressure wounds who presented from home after cardiac arrest.   Pt reportedly reported UTI symptoms to his sister and not feeling well.  Found unresponsive and apneic, CPR started by fire, found in in PEA.  ROSC obtained after 15 mins of ACLS measures including epi x2, NS bolus, nebs, and intubated.  On arrival to ER, pt with GCS of 3 requiring epi gtt.  Workup revealing for lactic 4.1, WBC 22, H/H 9.2/30 (stable from 03/2023), CMET pending but istat showing glucose 523, sCr 1.  CXR, CT of head/ chest/ abd/ pelvis pending.   PCCM called for admit.   Pertinent  Medical History   Past Medical History:  Diagnosis Date   Anxiety    Benign essential tremor    Bipolar 2 disorder [facility])    followed by [facility]--- dr s. Lolly Mustache   Bladder cancer [facility])    recurrent   CAD (coronary artery disease)    cardiac cath 2003  and 2011 both showed normal coronary arteries w/ preserved lvf;  Non obstructive on CTA Oct 2019.    Chronic pain syndrome    back---- [facility] [place]   Cold extremities    BLE   COPD (chronic obstructive pulmonary disease) (HCC)    DDD (degenerative disc disease), lumbar    Diabetic peripheral neuropathy (HCC)    Gastroparesis    followed by dr Marina Goodell   GERD (gastroesophageal reflux disease)    Hiatal hernia    History of bladder cancer urologist-  previously dr Ronal Fear;  now dr gay   papillay TCC (Ta G1)  s/p TURBT and chemo  instillation 2014   History of chest pain 12/2017   heart cath normal   History of encephalopathy 05/27/2015   admission w/ acute encephalopathy thought to be secondary to pain meds and COPD   History of gastric ulcer    History of Helicobacter pylori infection    History of kidney stones    History of TIA (transient ischemic attack) 2008  and 10-19-2018    no residual's   History of traumatic head injury 2010   w/ LOC  per pt needed stitches, hit in head with a mower blade   Hyperlipidemia    Hypertension    Hypogonadism male    s/p  bilateral orchiectomy   Hypothyroidism    Insomnia    Mild obstructive sleep apnea    study in epic 12-04-2016, no cpap   PTSD (post-traumatic stress disorder)    chronic   PTSD (post-traumatic stress disorder)    RA (rheumatoid arthritis) (HCC)    followed by [facility]   Seizures, transient [facility]) neurologist-  dr Terrace Arabia--  differential dx complex partial seizure .vs.  mood disorder .vs.  pseudoseizure--  negative EEG's   confusion episodes and staring spells since 11/ 2015   (03-26-2020 per pt wife last seizure 10 /2021)   Suicide attempt by drug overdose (HCC) 07/28/2021   Transient confusion NEUOROLOGIST-  DR Terrace Arabia   Episodes since 11/ 2015--  neurologist dx  differential complex partial seizure  .vs. mood disorder . vs. pseudoseizure   Type 2 diabetes mellitus treated with insulin [facility])    endocrinologist--- dr Everardo All---  (03-26-2020 pt does not check blood sugar at home)   Significant Hospital Events: Including procedures, antibiotic start and stop dates in addition to other pertinent events     Interim History / Subjective:  ***  Objective   Blood pressure 127/66, pulse (!) 105, resp. rate 18, SpO2 100%.    Vent Mode: PRVC FiO2 (%):  [100 %] 100 % Set Rate:  [18 bmp-22 bmp] 22 bmp Vt Set:  [962 mL] 620 mL PEEP:  [5 cmH20] 5 cmH20  No intake or output data in the 24 hours ending 04/30/23 1845 There were no vitals filed for  this visit.  Examination: General: *** HENT: *** Lungs: *** Cardiovascular: *** Abdomen: *** Extremities: *** Neuro: *** GU: ***  Resolved Hospital Problem list   ***  Assessment & Plan:  ***  Best Practice (right click and "Reselect all SmartList Selections" daily)   Diet/type: {diet type:25684} DVT prophylaxis {anticoagulation:25687} Pressure ulcer(s): {pressure ulcer(s):31683} GI prophylaxis: {XB:28413} Lines: {Central Venous Access:25771} Foley:  {Central Venous Access:25691} Code Status:  {Code Status:26939} Last date of multidisciplinary goals of care discussion [***]  Labs   CBC: Recent Labs  Lab 04/30/23 1814 04/30/23 1827  WBC 22.1*  --   HGB 9.2* 9.2*  HCT 30.3* 27.0*  MCV 83.9  --   PLT 380  --     Basic Metabolic Panel: Recent Labs  Lab 04/30/23 1827  NA 139  K 3.9  CL 108  GLUCOSE 523*  BUN 16  CREATININE 1.00   GFR: CrCl cannot be calculated (Unknown ideal weight.). Recent Labs  Lab 04/30/23 1814 04/30/23 1832  WBC 22.1*  --   LATICACIDVEN  --  4.1*    Liver Function Tests: No results for input(s): "AST", "ALT", "ALKPHOS", "BILITOT", "PROT", "ALBUMIN" in the last 168 hours. No results for input(s): "LIPASE", "AMYLASE" in the last 168 hours. No results for input(s): "AMMONIA" in the last 168 hours.  ABG    Component Value Date/Time   PHART 7.390 05/28/2015 0017   PCO2ART 49.7 (H) 05/28/2015 0017   PO2ART 64.0 (L) 05/28/2015 0017   HCO3 24.1 08/25/2022 2145   TCO2 21 (L) 04/30/2023 1827   ACIDBASEDEF 1.0 08/25/2022 2145   O2SAT 43 08/25/2022 2145     Coagulation Profile: No results for input(s): "INR", "PROTIME" in the last 168 hours.  Cardiac Enzymes: No results for input(s): "CKTOTAL", "CKMB", "CKMBINDEX", "TROPONINI" in the last 168 hours.  HbA1C: Hgb A1c MFr Bld  Date/Time Value Ref Range Status  09/25/2022 12:02 AM 12.4 (H) 4.8 - 5.6 % Final    Comment:    (NOTE) Pre diabetes:          5.7%-6.4%  Diabetes:               >6.4%  Glycemic control for   <7.0% adults with diabetes   08/26/2022 08:14 AM 14.1 (H) 4.8 - 5.6 % Final    Comment:    (NOTE) Pre diabetes:          5.7%-6.4%  Diabetes:              >6.4%  Glycemic control for   <7.0% adults with diabetes     CBG: No results for input(s): "GLUCAP" in the last 168 hours.  Review of Systems:   ***  Past Medical History:  He,  has a past medical history of Anxiety, Benign essential tremor, Bipolar 2 disorder (HCC), Bladder cancer (HCC), CAD (coronary artery disease), Chronic pain syndrome, Cold extremities, COPD (chronic obstructive pulmonary disease) (HCC), DDD (degenerative disc disease), lumbar, Diabetic peripheral neuropathy (HCC), Gastroparesis, GERD (gastroesophageal reflux disease), Hiatal hernia, History of bladder cancer (urologist-  previously dr Ronal Fear;  now dr gay), History of chest pain (12/2017), History of encephalopathy (05/27/2015), History of gastric ulcer, History of Helicobacter pylori infection, History of kidney stones, History of TIA (transient ischemic attack) (2008  and 10-19-2018), History of traumatic head injury (2010), Hyperlipidemia, Hypertension, Hypogonadism male, Hypothyroidism, Insomnia, Mild obstructive sleep apnea, PTSD (post-traumatic stress disorder), PTSD (post-traumatic stress disorder), RA (rheumatoid arthritis) (HCC), Seizures, transient (HCC) (neurologist-  dr Terrace Arabia--  differential dx complex partial seizure .vs.  mood disorder .vs.  pseudoseizure--  negative EEG's), Suicide attempt by drug overdose (HCC) (07/28/2021), Transient confusion (NEUOROLOGIST-  DR YAN), and Type 2 diabetes mellitus treated with insulin (HCC).   Surgical History:   Past Surgical History:  Procedure Laterality Date   AMPUTATION Left 04/28/2020   Procedure: LEFT LITTLE FINGER AMPUTATION;  Surgeon: Nadara Mustard, MD;  Location: [facility] OR;  Service: Orthopedics;  Laterality: Left;   CARDIAC CATHETERIZATION  12-27-2001  DR Jacinto Halim  &   05-26-2009  DR Eldridge Dace   RESULTS FOR BOTH ARE NORMAL CORONARIES AND PERSERVED LVF/ EF 60%   CARPAL TUNNEL RELEASE Bilateral right 09-16-2003;  left ?   CARPAL TUNNEL RELEASE Left 02/25/2015   Procedure: LEFT CARPAL TUNNEL RELEASE;  Surgeon: Betha Loa, MD;  Location: [facility];  Service: Orthopedics;  Laterality: Left;   CYSTOSCOPY N/A 10/10/2012   Procedure: CYSTOSCOPY CLOT EVACUATION FULGERATION OF BLEEDERS ;  Surgeon: Garnett Farm, MD;  Location: [facility];  Service: Urology;  Laterality: N/A;   CYSTOSCOPY WITH BIOPSY N/A 11/26/2015   Procedure: CYSTOSCOPY WITH BIOPSY AND FULGURATION;  Surgeon: Ihor Gully, MD;  Location[facility];  Service: Urology;  Laterality: N/A;   ESOPHAGOGASTRODUODENOSCOPY  2014   IR CATHETER TUBE CHANGE  02/06/2023   IR SINUS/FIST TUBE CHK-NON GI  02/06/2023   IR US GUIDE BX ASP/DRAIN  01/19/2023   IRRIGATION AND DEBRIDEMENT ABSCESS Right 09/27/2022   Procedure: IRRIGATION AND DEBRIDEMENT OF RIGHT BUTTOCK  ABSCESS;  Surgeon: Griselda Miner, MD;  Location: [facility] OR;  Service: General;  Laterality: Right;   LAPAROSCOPIC CHOLECYSTECTOMY  11-17-2010   ORCHIECTOMY Right 02/21/2016   Procedure: SCROTAL ORCHIECTOMY with TESTICULAR PROSTHESIS IMPLANT;  Surgeon: Ihor Gully, MD;  Location: [facility];  Service: Urology;  Laterality: Right;   ORCHIECTOMY Left 09/02/2018   Procedure: ORCHIECTOMY;  Surgeon: Ihor Gully, MD;  Location: [facility];  Service: Urology;  Laterality: Left;   ROTATOR CUFF REPAIR Right 12/2004   TRANSURETHRAL RESECTION OF BLADDER TUMOR N/A 08/09/2012   Procedure: TRANSURETHRAL RESECTION OF BLADDER TUMOR (TURBT) WITH GYRUS WITH MITOMYCIN C;  Surgeon: Garnett Farm, MD;  Location: [facility];  Service: Urology;  Laterality: N/A;   TRANSURETHRAL RESECTION OF BLADDER TUMOR N/A 03/29/2020   Procedure: TRANSURETHRAL RESECTION OF BLADDER TUMOR (TURBT) and  post-op instillation of gemcitabine;  Surgeon: [name], MD;  Location: [facility];  Service: Urology;  Laterality: N/A;   TRANSURETHRAL RESECTION OF BLADDER TUMOR WITH GYRUS (TURBT-GYRUS) N/A 02/27/2014   Procedure: TRANSURETHRAL RESECTION OF BLADDER TUMOR WITH GYRUS (TURBT-GYRUS);  Surgeon: Garnett Farm, MD;  Location: [facility];  Service: Urology;  Laterality: N/A;     Social History:   reports that he has been smoking cigarettes. He has a 57 pack-year smoking history. He has never used smokeless tobacco. He reports current alcohol use. He reports that he does not use drugs.   Family History:  His family history includes Alcohol abuse in his father; Diabetes in his father and mother; Heart attack (age of onset: 62) in his father; Hypertension in his father. There is no history of Colon cancer, Esophageal cancer, Stomach cancer, or Rectal cancer.   Allergies Allergies  Allergen Reactions   Celecoxib Anaphylaxis, Swelling, Rash and Other (See Comments)    Tongue swells   Sucralfate Anaphylaxis, Rash and Other (See Comments)    Broke out in a rash with hives that left scars on his legs   Hydrocodone Rash and Other (See Comments)    "Blisters developed on arms"; tolerated Norco okay (per sister)    Sulfa Antibiotics Rash     Home Medications  Prior to Admission medications   Medication Sig Start Date End Date Taking? Authorizing Provider  acetaminophen (TYLENOL) 500 MG tablet Take 1,000 mg by mouth every 6 (six) hours as needed for mild pain (pain score 1-3) or moderate pain (pain score 4-6).    [provider]  amLODipine (NORVASC) 10 MG tablet Take 10 mg by mouth daily. 03/15/23   [provider]  hydroxychloroquine (PLAQUENIL) 200 MG tablet Take 200 mg by mouth 2 (two) times daily. 03/15/23   [provider]  hydrOXYzine (ATARAX) 25 MG tablet Take 25 mg by mouth at bedtime. 08/01/22   [provider]  insulin  glargine, 2 Unit Dial, (TOUJEO MAX SOLOSTAR) 300 UNIT/ML Solostar Pen Inject 25 Units into the skin 2 (two) times daily at 8 am and 10 pm. Patient taking differently: Inject 35 Units into the skin daily. 03/25/23   Azucena Fallen, MD  insulin lispro (HUMALOG) 100 UNIT/ML injection Inject 35 Units into the skin in the morning and at bedtime.    [provider]  lurasidone (LATUDA) 40 MG TABS tablet Take 40 mg by mouth daily with breakfast. 03/15/23   [provider]  omeprazole (PRILOSEC) 40 MG capsule Take 40 mg by mouth daily before breakfast. 08/23/21   [provider]  pregabalin (LYRICA) 200 MG capsule Take 1 capsule (200 mg total) by mouth 3 (three) times daily. 01/23/23   Rhetta Mura, MD  Prenatal 27-1 MG TABS Take 1 tablet by mouth daily. Patient not taking: Reported on 03/29/2023 03/23/23   [provider]  rOPINIRole (REQUIP) 0.5 MG tablet Take 0.5 mg by mouth at bedtime. 03/20/23   [provider]  rosuvastatin (CRESTOR) 10 MG tablet Take 10 mg by mouth at bedtime. 03/15/23   [provider]  sodium chloride flush (NS) 0.9 % SOLN 5 mLs by Intracatheter route every 8 (eight) hours. Patient not taking: Reported on 03/25/2023 01/25/23   Almon Hercules, MD  traZODone (DESYREL) 50 MG tablet Take 50 mg by mouth at bedtime. 02/10/23   [provider]  venlafaxine XR (EFFEXOR-XR) 150 MG 24 hr capsule Take 1 capsule (150 mg total) by mouth daily with breakfast. 01/23/23   Rhetta Mura, MD  Vitamin D, Ergocalciferol, (DRISDOL) 1.25 MG (50000 UNIT) CAPS capsule Take 50,000 Units by mouth once a week. 03/24/23   [provider]     Critical care time: ***

## 2023-04-30 NOTE — H&P (Cosign Needed Addendum)
NAME:  Vincent [name], MRN:  [id number], DOB:  1965-05-17, LOS: 0 ADMISSION DATE:  04/30/2023 CONSULTATION DATE:  04/30/2023 REFERRING MD:  Renaye Rakers - EDP, CHIEF COMPLAINT:  Cardiac arrest   History of Present Illness:  58 year old man who presented to [facility] ED 2/17 after OOH cardiac arrest. PMHx significant for HTN, HLD, CAD, TIA, COPD, mild OSA, T2DM (c/b gastroparesis and diabetic peripheral neuropathy), hypothyroidism, GERD, PUD/H. Pylori), bladder CA, hypogonadism (s/p bilateral orchiectomy), DDD with chronic back pain, RA, benign essential tremor, transient seizure vs. pseudoseizure, bipolar disorder, anxiety, PTSD.  Patient is intubated, therefore history is obtained from chart review and from patient's sister/caregiver (at bedside). Patient was reportedly discharged home from rehab SNF ~ 1 week PTA; tested positive for influenza at the facility and facility MD called Tamiflu in. Reportedly taking Tamiflu at home. Patient had also reported UTI symptoms to his sister and complained of not feeling well. On evening of admission, patient was found unresponsive and apneic. CPR started by sister with coaching from 911 dispatcher. Fire arrived and continued CPR; on EMS arrival initial rhythm was PEA. ROSC obtained after 15 mins of ACLS measures including Epi x 2, NS bolus, nebs and intubation.   On arrival to ER, patient had a GCS of 3 and was requiring Epi gtt. Labs were notable for WBC 37, Hgb 8.5 (near baseline), Plt 444. Na 139, K 3.3, CO2 17, Cr 1.25 (baseline 0.8-1). Glucose 509. Transaminases WNL, Alk Phos 136, Tbili 0.5. INR 1.3. Trop 13. PCT 0.42. Flu A+. BCx, UCx and Resp Cx pending. Broad-spectrum Vanc/Zosyn started. CXR demonstrated bilateral diffuse interstitial hazy airspace opacities. CT Head NAICA, C-spine without acute findings. CTA PE Protocol negative for PE, +diffuse airspace/GGO most prominent in LLL c/w PNA and trace L pleural fluid, bilateral rib fractures. CT Abd with stool ball in  rectum c/f fecal impaction, +suprapubic catheter within thick-walled bladder. Of noted, at the time of exam, patient was awake and intermittently following commands.  PCCM consulted for ICU admission.  Pertinent Medical History:   Past Medical History:  Diagnosis Date   Anxiety    Benign essential tremor    Bipolar 2 disorder (HCC)    followed by [facility]--- dr s. Lolly Mustache   Bladder cancer [facility])    recurrent   CAD (coronary artery disease)    cardiac cath 2003  and 2011 both showed normal coronary arteries w/ preserved lvf;  Non obstructive on CTA Oct 2019.    Chronic pain syndrome    back---- [facility] [place]   Cold extremities    BLE   COPD (chronic obstructive pulmonary disease) (HCC)    DDD (degenerative disc disease), lumbar    Diabetic peripheral neuropathy (HCC)    Gastroparesis    followed by dr Marina Goodell   GERD (gastroesophageal reflux disease)    Hiatal hernia    History of bladder cancer urologist-  previously dr Ronal Fear;  now dr gay   papillay TCC (Ta G1)  s/p TURBT and chemo instillation 2014   History of chest pain 12/2017   heart cath normal   History of encephalopathy 05/27/2015   admission w/ acute encephalopathy thought to be secondary to pain meds and COPD   History of gastric ulcer    History of Helicobacter pylori infection    History of kidney stones    History of TIA (transient ischemic attack) 2008  and 10-19-2018    no residual's   History of traumatic head injury 2010  w/ LOC  per pt needed stitches, hit in head with a mower blade   Hyperlipidemia    Hypertension    Hypogonadism male    s/p  bilateral orchiectomy   Hypothyroidism    Insomnia    Mild obstructive sleep apnea    study in epic 12-04-2016, no cpap   PTSD (post-traumatic stress disorder)    chronic   PTSD (post-traumatic stress disorder)    RA (rheumatoid arthritis) (HCC)    followed by [facility]   Seizures, transient [facility]) neurologist-  dr Terrace Arabia--   differential dx complex partial seizure .vs.  mood disorder .vs.  pseudoseizure--  negative EEG's   confusion episodes and staring spells since 11/ 2015   (03-26-2020 per pt wife last seizure 10 /2021)   Suicide attempt by drug overdose (HCC) 07/28/2021   Transient confusion NEUOROLOGIST-  DR Terrace Arabia   Episodes since 11/ 2015--  neurologist dx  differential complex partial seizure  .vs. mood disorder . vs. pseudoseizure   Type 2 diabetes mellitus treated with insulin [facility])    endocrinologist--- dr Everardo All---  (03-26-2020 pt does not check blood sugar at home)    Significant Hospital Events: Including procedures, antibiotic start and stop dates in addition to other pertinent events   2/17 - Presented to [facility] via EMS post-OOH cardiac arrest. Intubated. Labs as above. CT Head NAICA, C-spine negative, CTA PE protocol negative for PE but +hazy interstitial and GGOs c/f PNA, CT Abd with stool ball/thick walled bladder with catheter in place. Vanc/Zosyn started. Epi gtt weaning over to Levo. PCCM consulted for ICU admission.  Interim History / Subjective:  PCCM consulted for ICU admission.  Objective:  Blood pressure 127/66, pulse (!) 116, temperature (!) 90 F (32.2 C), temperature source Axillary, resp. rate 18, height 6' (1.829 m), weight 81 kg, SpO2 100%.    Vent Mode: PRVC FiO2 (%):  [60 %-100 %] 60 % Set Rate:  [18 bmp-30 bmp] 30 bmp Vt Set:  [620 mL] 620 mL PEEP:  [5 cmH20] 5 cmH20 Plateau Pressure:  [24 cmH20] 24 cmH20   Intake/Output Summary (Last 24 hours) at 04/30/2023 2315 Last data filed at 04/30/2023 2145 Gross per 24 hour  Intake 17.66 ml  Output 550 ml  Net -532.34 ml   Filed Weights   04/30/23 2106  Weight: 81 kg   Physical Examination: General: Acute-on-chronically ill-appearing middle-aged man in NAD. Appears older than stated age. HEENT: [place]/AT, anicteric sclera, L pupil 3mm reactive, R pupil 5mm reactive, moist mucous membranes. Neuro:  Awake, lightly sedated/intubated.   Responds to verbal stimuli. Following commands intermittently. Moves BUE spontaneously. Unable to fully assess strength but BUE grip strength equal.+Corneal, +Cough, and +Gag  CV: Irregularly irregular rhythm, rate 110s, no m/g/r. PULM: Breathing even and unlabored on vent (PEEP 5, FiO2 60%). Lung fields with coarse rhonchi throughout, worse in bilateral lower lobes L > R. GI: Soft, nontender, nondistended. Hypoactive bowel sounds. GU: Suprapubic catheter present with dried yellow-brown drainage around catheter/entry site. Catheter tubing/bag filled with cloudy dark yellow urine and significant sediment. Extremities: No LE edema noted. Skin: Warm/dry, no rashes, chronic skin changes to bilateral lower shins; bilateral heel wounds noted.    Resolved Hospital Problem List:    Assessment & Plan:  Post-cardiac arrest, OOH - Admit to ICU for close monitoring - Targeted temperature management/normothermia protocol; patient is awake and intermittently following commands, normothermic - Goal MAP > 65 - Trend troponins, heparin gtt x 48H if concern for ACS - F/u  Echo  Undifferentiated shock, presume primarily septic, query cardiogenic component s/p arrest Leukocytosis Multifocal PNA ?UTI, suprapubic catheter in place History of ESBL infection and MRSA. Cx data from 01/2023 ("abscess") MDR Klebsiella pneumoniae, E. Coli and E. Faecalis. Urine Cx data may be difficult to interpret (pending organism growth) due to likely colonization and nonsterile catheter. - Goal MAP > 65 - Fluid resuscitation as tolerated - Levophed titrated to goal MAP, +vaso if needed, wean off of Epi gtt - Trend WBC, fever curve, LA - F/u Cx data, PCT - Continue broad-spectrum antibiotics (Vanc/Zosyn for now until Cx data available)  Acute hypoxemic and hypercarbic respiratory failure post-cardiac arrest Multifocal pneumonia Influenza A infection History of COPD History of mild OSA - Continue full vent support (4-8cc/kg  IBW) - Wean FiO2 for O2 sat > 90% - Daily WUA/SBT as appropriate, vent settings need to decrease prior to weaning trials - VAP bundle - Hydrocortisone 100mg  Q12H - S/p Tamiflu course, likely no utility in repeating; antibiotic coverage as above - Pulmonary hygiene - PAD protocol for sedation: Fentanyl for goal RASS 0 to -1, no prop for now in the setting of borderline BP - Follow CXR, ABG  Paroxysmal Afib HTN HLD CAD - Cardiac monitoring - Optimize electrolytes for K > 4, Mg > 2 - Hold AC for now, may need heparin gtt initiation if Afib continues; will also follow troponins if concern for ACS contributing to cardiac arrest - if this is the case 48H heparin gtt would be warranted - Hold home Norvasc - Resume statin - F/u Echo as above  T2DM with hyperglycemia, likely in the setting of sepsis Gastroparesis CBGs 400s and serum glucoses > 500 on arrival. - Insulin gtt with titration per EndoTool - CBGs per Endotool - Transition to SQ insulin when appropriate - Hold home Toujeo for now  GERD History of PUD/H. Pylori - PPI  AKI, mild, likely ATN in the setting of sepsis/cardiac arrest Hypokalemia Hyperphosphatemia History of bladder CA Indwelling suprapubic catheter - Trend BMP - Replete electrolytes as indicated - Monitor I&Os - Catheter care per protocol - F/u urine studies - Avoid nephrotoxic agents as able - Ensure adequate renal perfusion  Pressure wounds of bilateral heels, POA - WOC consult  RA DDD Chronic back pain - PAD protocol in place while intubated - Resume home Plaquenil, Lyrica when clinically appropriate  Bipolar disorder Anxiety PTSD Transient seizures vs. pseudoseizure Essential tremor - Resume home Latuda, Effexor, Requip when appropriate  Best Practice: (right click and "Reselect all SmartList Selections" daily)   Diet/type: NPO DVT prophylaxis: SCDs, SQH versus heparin gtt pending trop trend/level of concern for ACS GI prophylaxis:  PPI Lines: Central line and Arterial Line Foley:  N/A - indwelling suprapubic catheter noted on admission Code Status:  full code Last date of multidisciplinary goals of care discussion [2/17 - Sister, [name], at bedside confirms ongoing full code status.]  Labs:  CBC: Recent Labs  Lab 04/30/23 1814 04/30/23 1827 04/30/23 1955 04/30/23 2010  WBC SPECIMEN CONTAMINATED, UNABLE TO PERFORM TEST(S).  --   --  37.0*  HGB SPECIMEN CONTAMINATED, UNABLE TO PERFORM TEST(S). 9.2* 8.2* 8.5*  HCT SPECIMEN CONTAMINATED, UNABLE TO PERFORM TEST(S). 27.0* 24.0* 26.8*  MCV SPECIMEN CONTAMINATED, UNABLE TO PERFORM TEST(S).  --   --  83.2  PLT SPECIMEN CONTAMINATED, UNABLE TO PERFORM TEST(S).  --   --  444*   Basic Metabolic Panel: Recent Labs  Lab 04/30/23 1827 04/30/23 1955 04/30/23 2010  NA 139 141 139  K 3.9 3.2* 3.3*  CL 108  --  110  CO2  --   --  17*  GLUCOSE 523*  --  509*  BUN 16  --  15  CREATININE 1.00  --  1.25*  CALCIUM  --   --  8.0*  MG  --   --  1.7  PHOS  --   --  6.4*   GFR: Estimated Creatinine Clearance: 73.3 mL/min (A) (by C-G formula based on SCr of 1.25 mg/dL (H)). Recent Labs  Lab 04/30/23 1814 04/30/23 1832 04/30/23 2010  PROCALCITON  --   --  0.42  WBC SPECIMEN CONTAMINATED, UNABLE TO PERFORM TEST(S).  --  37.0*  LATICACIDVEN  --  4.1*  --    Liver Function Tests: Recent Labs  Lab 04/30/23 2010  AST 20  ALT 18  ALKPHOS 136*  BILITOT 0.5  PROT 5.5*  ALBUMIN 1.7*   No results for input(s): "LIPASE", "AMYLASE" in the last 168 hours. No results for input(s): "AMMONIA" in the last 168 hours.  ABG:    Component Value Date/Time   PHART 7.145 (LL) 04/30/2023 1955   PCO2ART 60.3 (H) 04/30/2023 1955   PO2ART 239 (H) 04/30/2023 1955   HCO3 20.8 04/30/2023 1955   TCO2 23 04/30/2023 1955   ACIDBASEDEF 8.0 (H) 04/30/2023 1955   O2SAT 100 04/30/2023 1955    Coagulation Profile: Recent Labs  Lab 04/30/23 1814 04/30/23 2010  INR SPECIMEN CONTAMINATED,  UNABLE TO PERFORM TEST(S). 1.3*   Cardiac Enzymes: No results for input(s): "CKTOTAL", "CKMB", "CKMBINDEX", "TROPONINI" in the last 168 hours.  HbA1C: Hgb A1c MFr Bld  Date/Time Value Ref Range Status  09/25/2022 12:02 AM 12.4 (H) 4.8 - 5.6 % Final    Comment:    (NOTE) Pre diabetes:          5.7%-6.4%  Diabetes:              >6.4%  Glycemic control for   <7.0% adults with diabetes   08/26/2022 08:14 AM 14.1 (H) 4.8 - 5.6 % Final    Comment:    (NOTE) Pre diabetes:          5.7%-6.4%  Diabetes:              >6.4%  Glycemic control for   <7.0% adults with diabetes    CBG: Recent Labs  Lab 04/30/23 2119  GLUCAP 438*    Review of Systems:   Patient is encephalopathic and/or intubated; therefore, history has been obtained from chart review and from sister, at bedside.   Past Medical History:  He,  has a past medical history of Anxiety, Benign essential tremor, Bipolar 2 disorder (HCC), Bladder cancer (HCC), CAD (coronary artery disease), Chronic pain syndrome, Cold extremities, COPD (chronic obstructive pulmonary disease) (HCC), DDD (degenerative disc disease), lumbar, Diabetic peripheral neuropathy (HCC), Gastroparesis, GERD (gastroesophageal reflux disease), Hiatal hernia, History of bladder cancer (urologist-  previously dr Ronal Fear;  now dr gay), History of chest pain (12/2017), History of encephalopathy (05/27/2015), History of gastric ulcer, History of Helicobacter pylori infection, History of kidney stones, History of TIA (transient ischemic attack) (2008  and 10-19-2018), History of traumatic head injury (2010), Hyperlipidemia, Hypertension, Hypogonadism male, Hypothyroidism, Insomnia, Mild obstructive sleep apnea, PTSD (post-traumatic stress disorder), PTSD (post-traumatic stress disorder), RA (rheumatoid arthritis) (HCC), Seizures, transient (HCC) (neurologist-  dr Terrace Arabia--  differential dx complex partial seizure .vs.  mood disorder .vs.  pseudoseizure--  negative EEG's),  Suicide attempt by drug overdose (HCC) (07/28/2021), Transient  confusion (NEUOROLOGIST-  DR Terrace Arabia), and Type 2 diabetes mellitus treated with insulin (HCC).   Surgical History:   Past Surgical History:  Procedure Laterality Date   AMPUTATION Left 04/28/2020   Procedure: LEFT LITTLE FINGER AMPUTATION;  Surgeon: Nadara Mustard, MD;  Location: [facility] OR;  Service: Orthopedics;  Laterality: Left;   CARDIAC CATHETERIZATION  12-27-2001  DR Jacinto Halim  &  05-26-2009  DR Eldridge Dace   RESULTS FOR BOTH ARE NORMAL CORONARIES AND PERSERVED LVF/ EF 60%   CARPAL TUNNEL RELEASE Bilateral right 09-16-2003;  left ?   CARPAL TUNNEL RELEASE Left 02/25/2015   Procedure: LEFT CARPAL TUNNEL RELEASE;  Surgeon: Betha Loa, MD;  Location: [facility];  Service: Orthopedics;  Laterality: Left;   CYSTOSCOPY N/A 10/10/2012   Procedure: CYSTOSCOPY CLOT EVACUATION FULGERATION OF BLEEDERS ;  Surgeon: Garnett Farm, MD;  Location: [facility];  Service: Urology;  Laterality: N/A;   CYSTOSCOPY WITH BIOPSY N/A 11/26/2015   Procedure: CYSTOSCOPY WITH BIOPSY AND FULGURATION;  Surgeon: Ihor Gully, MD;  Location: [facility];  Service: Urology;  Laterality: N/A;   ESOPHAGOGASTRODUODENOSCOPY  2014   IR CATHETER TUBE CHANGE  02/06/2023   IR SINUS/FIST TUBE CHK-NON GI  02/06/2023   IR US GUIDE BX ASP/DRAIN  01/19/2023   IRRIGATION AND DEBRIDEMENT ABSCESS Right 09/27/2022   Procedure: IRRIGATION AND DEBRIDEMENT OF RIGHT BUTTOCK  ABSCESS;  Surgeon: Griselda Miner, MD;  Location: [facility] OR;  Service: General;  Laterality: Right;   LAPAROSCOPIC CHOLECYSTECTOMY  11-17-2010   ORCHIECTOMY Right 02/21/2016   Procedure: SCROTAL ORCHIECTOMY with TESTICULAR PROSTHESIS IMPLANT;  Surgeon: Ihor Gully, MD;  Location: [facility];  Service: Urology;  Laterality: Right;   ORCHIECTOMY Left 09/02/2018   Procedure: ORCHIECTOMY;  Surgeon: Ihor Gully, MD;  Location: [facility];  Service:  Urology;  Laterality: Left;   ROTATOR CUFF REPAIR Right 12/2004   TRANSURETHRAL RESECTION OF BLADDER TUMOR N/A 08/09/2012   Procedure: TRANSURETHRAL RESECTION OF BLADDER TUMOR (TURBT) WITH GYRUS WITH MITOMYCIN C;  Surgeon: Garnett Farm, MD;  Location: [facility];  Service: Urology;  Laterality: N/A;   TRANSURETHRAL RESECTION OF BLADDER TUMOR N/A 03/29/2020   Procedure: TRANSURETHRAL RESECTION OF BLADDER TUMOR (TURBT) and post-op instillation of gemcitabine;  Surgeon: [name], MD;  Location: [facility];  Service: Urology;  Laterality: N/A;   TRANSURETHRAL RESECTION OF BLADDER TUMOR WITH GYRUS (TURBT-GYRUS) N/A 02/27/2014   Procedure: TRANSURETHRAL RESECTION OF BLADDER TUMOR WITH GYRUS (TURBT-GYRUS);  Surgeon: Garnett Farm, MD;  Location: [facility];  Service: Urology;  Laterality: N/A;    Social History:   reports that he has been smoking cigarettes. He has a 57 pack-year smoking history. He has never used smokeless tobacco. He reports current alcohol use. He reports that he does not use drugs.   Family History:  His family history includes Alcohol abuse in his father; Diabetes in his father and mother; Heart attack (age of onset: 39) in his father; Hypertension in his father. There is no history of Colon cancer, Esophageal cancer, Stomach cancer, or Rectal cancer.   Allergies: Allergies  Allergen Reactions   Celecoxib Anaphylaxis, Swelling, Rash and Other (See Comments)    Tongue swells   Sucralfate Anaphylaxis, Rash and Other (See Comments)    Broke out in a rash with hives that left scars on his legs   Hydrocodone Rash and Other (See Comments)    "Blisters developed on arms";  tolerated Norco okay (per sister)    Sulfa Antibiotics Rash    Home Medications: Prior to Admission medications   Medication Sig Start Date End Date Taking? Authorizing Provider  acetaminophen (TYLENOL) 500 MG tablet Take 1,000 mg by mouth every 6 (six)  hours as needed for mild pain (pain score 1-3) or moderate pain (pain score 4-6).    [provider]  amLODipine (NORVASC) 10 MG tablet Take 10 mg by mouth daily. 03/15/23   [provider]  atorvastatin (LIPITOR) 20 MG tablet Take 20 mg by mouth daily. 04/23/23   [provider]  fluticasone (FLONASE) 50 MCG/ACT nasal spray Place 1 spray into both nostrils daily. 04/23/23   [provider]  hydroxychloroquine (PLAQUENIL) 200 MG tablet Take 200 mg by mouth 2 (two) times daily. 03/15/23   [provider]  hydrOXYzine (ATARAX) 25 MG tablet Take 25 mg by mouth at bedtime. 08/01/22   [provider]  insulin glargine, 2 Unit Dial, (TOUJEO MAX SOLOSTAR) 300 UNIT/ML Solostar Pen Inject 25 Units into the skin 2 (two) times daily at 8 am and 10 pm. Patient taking differently: Inject 35 Units into the skin daily. 03/25/23   Azucena Fallen, MD  insulin lispro (HUMALOG) 100 UNIT/ML injection Inject 35 Units into the skin in the morning and at bedtime.    [provider]  lurasidone (LATUDA) 40 MG TABS tablet Take 40 mg by mouth daily with breakfast. 03/15/23   [provider]  omeprazole (PRILOSEC) 40 MG capsule Take 40 mg by mouth daily before breakfast. 08/23/21   [provider]  oseltamivir (TAMIFLU) 75 MG capsule Take 75 mg by mouth 2 (two) times daily. 04/23/23   [provider]  pregabalin (LYRICA) 200 MG capsule Take 1 capsule (200 mg total) by mouth 3 (three) times daily. 01/23/23   Rhetta Mura, MD  Prenatal 27-1 MG TABS Take 1 tablet by mouth daily. Patient not taking: Reported on 03/29/2023 03/23/23   [provider]  rOPINIRole (REQUIP) 0.5 MG tablet Take 0.5 mg by mouth at bedtime. 03/20/23   [provider]  rosuvastatin (CRESTOR) 10 MG tablet Take 10 mg by mouth at bedtime. 03/15/23   [provider]  sodium chloride flush (NS) 0.9 % SOLN 5 mLs by Intracatheter route every 8 (eight)  hours. Patient not taking: Reported on 03/25/2023 01/25/23   Almon Hercules, MD  traZODone (DESYREL) 50 MG tablet Take 50 mg by mouth at bedtime. 02/10/23   [provider]  venlafaxine XR (EFFEXOR-XR) 150 MG 24 hr capsule Take 1 capsule (150 mg total) by mouth daily with breakfast. 01/23/23   Rhetta Mura, MD  Vitamin D, Ergocalciferol, (DRISDOL) 1.25 MG (50000 UNIT) CAPS capsule Take 50,000 Units by mouth once a week. 03/24/23   [provider]   Critical care time:   The patient is critically ill with multiple organ system failure and requires high complexity decision making for assessment and support, frequent evaluation and titration of therapies, advanced monitoring, review of radiographic studies and interpretation of complex data.   Critical Care Time devoted to patient care services, exclusive of separately billable procedures, described in this note is 43 minutes.  Tim Lair, PA-C  Pulmonary & Critical Care 04/30/23 11:15 PM  Please see [email or web address] for pager details.  From 7A-7P if no response, please call [phone] After hours, please call ELink [phone]

## 2023-04-30 NOTE — Procedures (Signed)
Arterial Catheter Insertion Procedure Note  Vincent [name]  [id number]  January 10, 1966  Date:04/30/23  Time:7:58 PM    Provider Performing: Ermalinda Memos E    Procedure: Insertion of Arterial Line (62952) without US guidance  Indication(s) Blood pressure monitoring and/or need for frequent ABGs  Consent Risks of the procedure as well as the alternatives and risks of each were explained to the patient and/or caregiver.  Consent for the procedure was obtained and is signed in the bedside chart  Anesthesia None   Time Out Verified patient identification, verified procedure, site/side was marked, verified correct patient position, special equipment/implants available, medications/allergies/relevant history reviewed, required imaging and test results available.   Sterile Technique Maximal sterile technique including full sterile barrier drape, hand hygiene, sterile gown, sterile gloves, mask, hair covering, sterile ultrasound probe cover (if used).   Procedure Description Area of catheter insertion was cleaned with chlorhexidine and draped in sterile fashion. Without real-time ultrasound guidance an arterial catheter was placed into the left radial artery.  Appropriate arterial tracings confirmed on monitor.     Complications/Tolerance None; patient tolerated the procedure well.   EBL Minimal   Specimen(s) None RT Art Line Insertion  Date/Time: 04/30/2023 7:57 PM  Performed by: Tacy Learn, RRT Authorized by: Terald Sleeper, MD

## 2023-04-30 NOTE — Progress Notes (Addendum)
eLink Physician-Brief Progress Note Patient Name: Vincent [name] DOB: 12-07-1965 MRN: [id number]   Date of Service  04/30/2023  HPI/Events of Note  57/M with CAD, COPD, brought in via EMS after out of hospital arrest.  Pt was reportedly having respiratory infection and felt unwell for a few days, was in PEA arrest upon fire rescue arrival.  Total of 15 minutes of CPR was performed before ROSC was obtained.    Pt is flu positive, hyperglycemic with glucose in the 500s.   BP 120/57, HR 122, RR 28, O2 sats 100%.  Pt is opening his eyes spontaneously.  eICU Interventions  Advance ETT by 2cm. Insert OGT and repeat CXR and KUB.  Continue insulin gtt. Replete K.  Continue empiric antibiotics.  Pt is on stress dose steroids.  Continue pressors to keep MAP >65.  Fentanyl gtt. Titrate FiO2, increase RR to 30.  Heparin for DVT prophylaxis.  Pantoprazole for GI prophylaxis.  Wrist restraints ordered.       Intervention Category Evaluation Type: New Patient Evaluation  Larinda Buttery 04/30/2023, 9:57 PM  11:13 PM Pt restless and trying to get out of bed.   Plan> Add on propofol.   4:01 AM ABG 7.379/31.1/99 on 60% FiO2, RR 30.   Plan> Decrease RR to 26, keep on 60%.

## 2023-04-30 NOTE — ED Provider Notes (Signed)
[place] EMERGENCY DEPARTMENT AT [facility] Provider Note   CSN: [id number] Arrival date & time: 04/30/23  1758     History  Chief Complaint  Patient presents with   Cardiac Arrest    Vincent [name] is a 58 y.o. male history of coronary disease, COPD, presenting to the ED in PEA cardiac arrest.  Per history provided by EMS, they were called out the scene by the patient's sister, who had been speaking to the patient he had been complaining of feeling unwell with a "respiratory infection" for a few days.  She went to check on him and found him unresponsive the house today.  Fire rescue was performing CPR when paramedics arrived.  They report initial rhythm was PEA arrest.  They performed a total of 15 minutes of CPR, intubated the patient with a 7.0 ET tube, provided 2 rounds of epinephrine, as well as 500 cc of fluid.  They were able to obtain ROSC.  They noted a blood sugar of about 500 in the field.  They feel that the patient was biting on his tube and they are having difficulty back ventilating him and so they gave him 5 mg total of IV Versed and 100 mcg of fentanyl en route to the hospital.  Patient also received 1 DuoNeb.  Patient arrives obtunded, intubated and unresponsive.  HPI     Home Medications Prior to Admission medications   Medication Sig Start Date End Date Taking? Authorizing Provider  acetaminophen (TYLENOL) 500 MG tablet Take 1,000 mg by mouth every 6 (six) hours as needed for mild pain (pain score 1-3) or moderate pain (pain score 4-6).    [provider]  amLODipine (NORVASC) 10 MG tablet Take 10 mg by mouth daily. 03/15/23   [provider]  atorvastatin (LIPITOR) 20 MG tablet Take 20 mg by mouth daily. 04/23/23   [provider]  fluticasone (FLONASE) 50 MCG/ACT nasal spray Place 1 spray into both nostrils daily. 04/23/23   [provider]  hydroxychloroquine (PLAQUENIL) 200 MG tablet Take 200 mg by mouth 2 (two) times  daily. 03/15/23   [provider]  hydrOXYzine (ATARAX) 25 MG tablet Take 25 mg by mouth at bedtime. 08/01/22   [provider]  insulin glargine, 2 Unit Dial, (TOUJEO MAX SOLOSTAR) 300 UNIT/ML Solostar Pen Inject 25 Units into the skin 2 (two) times daily at 8 am and 10 pm. Patient taking differently: Inject 35 Units into the skin daily. 03/25/23   Azucena Fallen, MD  insulin lispro (HUMALOG) 100 UNIT/ML injection Inject 35 Units into the skin in the morning and at bedtime.    [provider]  lurasidone (LATUDA) 40 MG TABS tablet Take 40 mg by mouth daily with breakfast. 03/15/23   [provider]  omeprazole (PRILOSEC) 40 MG capsule Take 40 mg by mouth daily before breakfast. 08/23/21   [provider]  oseltamivir (TAMIFLU) 75 MG capsule Take 75 mg by mouth 2 (two) times daily. 04/23/23   [provider]  pregabalin (LYRICA) 200 MG capsule Take 1 capsule (200 mg total) by mouth 3 (three) times daily. 01/23/23   Rhetta Mura, MD  Prenatal 27-1 MG TABS Take 1 tablet by mouth daily. Patient not taking: Reported on 03/29/2023 03/23/23   [provider]  rOPINIRole (REQUIP) 0.5 MG tablet Take 0.5 mg by mouth at bedtime. 03/20/23   [provider]  rosuvastatin (CRESTOR) 10 MG tablet Take 10 mg by mouth at bedtime. 03/15/23  [provider]  sodium chloride flush (NS) 0.9 % SOLN 5 mLs by Intracatheter route every 8 (eight) hours. Patient not taking: Reported on 03/25/2023 01/25/23   Almon Hercules, MD  traZODone (DESYREL) 50 MG tablet Take 50 mg by mouth at bedtime. 02/10/23   [provider]  venlafaxine XR (EFFEXOR-XR) 150 MG 24 hr capsule Take 1 capsule (150 mg total) by mouth daily with breakfast. 01/23/23   Rhetta Mura, MD  Vitamin D, Ergocalciferol, (DRISDOL) 1.25 MG (50000 UNIT) CAPS capsule Take 50,000 Units by mouth once a week. 03/24/23   [provider]      Allergies    Celecoxib,  Sucralfate, Hydrocodone, and Sulfa antibiotics    Review of Systems   Review of Systems  Physical Exam Updated Vital Signs BP 127/66   Pulse (!) 120   Temp (!) 86.1 F (30.1 C) (Temporal)   Resp 20   SpO2 100%  Physical Exam Constitutional:      Comments: Obtunded, intubated  HENT:     Head: Normocephalic and atraumatic.  Eyes:     Conjunctiva/sclera: Conjunctivae normal.     Pupils: Pupils are equal, round, and reactive to light.     Comments: Pupils are 3 mm bilaterally, sluggishly reactive to light  Cardiovascular:     Rate and Rhythm: Regular rhythm. Tachycardia present.  Pulmonary:     Effort: Pulmonary effort is normal. No respiratory distress.     Comments: Bilateral breath sounds with bag ventilation, rhonchi noted in bilateral lung fields Abdominal:     General: There is no distension.     Tenderness: There is no abdominal tenderness.  Genitourinary:    Comments: Suprapubic catheter in place draining yellow urine Skin:    General: Skin is warm and dry.  Neurological:     Comments: GCS of 3     ED Results / Procedures / Treatments   Labs (all labs ordered are listed, but only abnormal results are displayed) Labs Reviewed  RESP PANEL BY RT-PCR (RSV, FLU A&B, COVID)  RVPGX2 - Abnormal; Notable for the following components:      Result Value   Influenza A by PCR POSITIVE (*)    All other components within normal limits  CBC - Abnormal; Notable for the following components:   WBC 37.0 (*)    RBC 3.22 (*)    Hemoglobin 8.5 (*)    HCT 26.8 (*)    Platelets 444 (*)    All other components within normal limits  PROTIME-INR - Abnormal; Notable for the following components:   Prothrombin Time 16.4 (*)    INR 1.3 (*)    All other components within normal limits  APTT - Abnormal; Notable for the following components:   aPTT 48 (*)    All other components within normal limits  I-STAT CG4 LACTIC ACID, ED - Abnormal; Notable for the following components:    Lactic Acid, Venous 4.1 (*)    All other components within normal limits  I-STAT CHEM 8, ED - Abnormal; Notable for the following components:   Glucose, Bld 523 (*)    Calcium, Ion 1.11 (*)    TCO2 21 (*)    Hemoglobin 9.2 (*)    HCT 27.0 (*)    All other components within normal limits  I-STAT ARTERIAL BLOOD GAS, ED - Abnormal; Notable for the following components:   pH, Arterial 7.145 (*)    pCO2 arterial 60.3 (*)    pO2, Arterial 239 (*)  Acid-base deficit 8.0 (*)    Potassium 3.2 (*)    HCT 24.0 (*)    Hemoglobin 8.2 (*)    All other components within normal limits  CULTURE, BLOOD (ROUTINE X 2)  CULTURE, BLOOD (ROUTINE X 2)  CULTURE, RESPIRATORY W GRAM STAIN  RESPIRATORY PANEL BY PCR  CBC  APTT  PROTIME-INR  TRIGLYCERIDES  RAPID URINE DRUG SCREEN, HOSP PERFORMED  URINALYSIS, W/ REFLEX TO CULTURE (INFECTION SUSPECTED)  BLOOD GAS, ARTERIAL  COMPREHENSIVE METABOLIC PANEL  MAGNESIUM  PROCALCITONIN  PHOSPHORUS  CBC  MAGNESIUM  PHOSPHORUS  BLOOD GAS, ARTERIAL  COMPREHENSIVE METABOLIC PANEL  HEMOGLOBIN A1C  I-STAT CG4 LACTIC ACID, ED  I-STAT CG4 LACTIC ACID, ED  I-STAT CG4 LACTIC ACID, ED  TROPONIN I (HIGH SENSITIVITY)  TROPONIN I (HIGH SENSITIVITY)    EKG EKG Interpretation Date/Time:  Monday April 30 2023 18:03:27 EST Ventricular Rate:  89 PR Interval:    QRS Duration:  95 QT Interval:  450 QTC Calculation: 548 R Axis:   57  Text Interpretation: Atrial fibrillation Ventricular premature complex Nonspecific repol abnormality, diffuse leads Prolonged QT interval Confirmed by Alvester Chou [phone]) on 04/30/2023 6:32:01 PM  Radiology DG Abd Portable 1V Result Date: 04/30/2023 CLINICAL DATA:  OG tube placement. EXAM: PORTABLE ABDOMEN - 1 VIEW COMPARISON:  CT earlier today FINDINGS: No enteric tube is seen in the upper abdomen or lower chest. There multiple overlying monitoring devices in place. Mild gaseous gastric distension. IMPRESSION: No enteric tube is  seen in the upper abdomen or lower chest. Recommend removal and replacement. If a tube has been placed, at the time of the exam recommend repositioning. Electronically Signed   By: Narda Rutherford M.D.   On: 04/30/2023 20:38   CT Angio Chest PE W and/or Wo Contrast Result Date: 04/30/2023 CLINICAL DATA:  Mental status change. Chest wall pain. "Malignancy known or suspected". Abdominal pain. Right upper quadrant. EXAM: CT ANGIOGRAPHY CHEST CT ABDOMEN AND PELVIS WITH CONTRAST TECHNIQUE: Multidetector CT imaging of the chest was performed using the standard protocol during bolus administration of intravenous contrast. Multiplanar CT image reconstructions and MIPs were obtained to evaluate the vascular anatomy. Multidetector CT imaging of the abdomen and pelvis was performed using the standard protocol during bolus administration of intravenous contrast. RADIATION DOSE REDUCTION: This exam was performed according to the departmental dose-optimization program which includes automated exposure control, adjustment of the mA and/or kV according to patient size and/or use of iterative reconstruction technique. CONTRAST:  75mL OMNIPAQUE IOHEXOL 350 MG/ML SOLN COMPARISON:  Prior CTA chest 03/29/2023. Abdominopelvic CT of 01/18/2023 FINDINGS: CTA CHEST FINDINGS Cardiovascular: The quality of this exam for evaluation of pulmonary embolism is moderate to good. No evidence of pulmonary embolism. Aortic atherosclerosis. Left carotid artery atherosclerosis as detailed previously. Normal heart size, without pericardial effusion. Lad and right coronary artery calcification. Right-sided central line from an IJ approach terminates in the low SVC. Mediastinum/Nodes: New mild right paratracheal adenopathy at 1.0 cm. Mild prevascular adenopathy at up to 8 mm, similar. Likely reactive. Lungs/Pleura: Trace left pleural fluid. Endotracheal tube terminates well above the carina. Development of diffuse multifocal ground-glass and airspace  disease. The most focal/confluent consolidation is in the left lower lobe. No pulmonary necrosis. Musculoskeletal: Remote bilateral rib fractures. 5th anterior right rib fracture favored to be acute or subacute on 115/4. Review of the MIP images confirms the above findings. CT ABDOMEN and PELVIS FINDINGS Hepatobiliary: Normal liver. Cholecystectomy, without biliary ductal dilatation. Pancreas: Normal, without mass or ductal dilatation. Spleen:  Normal in size, without focal abnormality. Adrenals/Urinary Tract: Normal adrenal glands. Normal kidneys, without hydronephrosis. Suprapubic bladder catheter. The pericatheter anterior pelvic wall collection has resolved. Stomach/Bowel: Normal stomach, without wall thickening. Large stool ball in the rectum of 5.7 cm. Portions of abdominal exam are mildly motion degraded. Normal terminal ileum and appendix. Portions of the right-side of the colon appear thick walled, favored to be due to underdistention. Normal small bowel. Vascular/Lymphatic: Advanced aortic and branch vessel atherosclerosis. No abdominopelvic adenopathy. Reproductive: Normal prostate. Other: No significant free fluid.  No free intraperitoneal air. Musculoskeletal: No acute osseous abnormality. Review of the MIP images confirms the above findings. IMPRESSION: CT CHEST IMPRESSION 1.  No evidence of pulmonary embolism. 2. Development of diffuse airspace and ground-glass opacity, most confluent in the left lower lobe since 03/29/2023. Consistent with pneumonia. 3. Age advanced coronary artery atherosclerosis. Recommend assessment of coronary risk factors. 4. Trace left pleural fluid 5.  Aortic Atherosclerosis (ICD10-I70.0). 6. Bilateral primarily remote rib fractures. Anterior fifth rib fracture is favored to be acute or subacute. CT ABDOMEN AND PELVIS IMPRESSION 1. Mild motion degradation throughout. 2. Stool ball in the rectum suggests constipation or even fecal impaction. 3. Suprapubic catheter within the  bladder, with resolution of pericatheter fluid collection. The bladder appears thick walled but is underdistended. Correlate with urinalysis to exclude 4. The ascending colon also appears thick walled but is underdistended. Correlate with symptoms of infectious colitis. Cystitis. Electronically Signed   By: Jeronimo Greaves M.D.   On: 04/30/2023 20:37   CT ABDOMEN W CONTRAST Result Date: 04/30/2023 CLINICAL DATA:  Mental status change. Chest wall pain. "Malignancy known or suspected". Abdominal pain. Right upper quadrant. EXAM: CT ANGIOGRAPHY CHEST CT ABDOMEN AND PELVIS WITH CONTRAST TECHNIQUE: Multidetector CT imaging of the chest was performed using the standard protocol during bolus administration of intravenous contrast. Multiplanar CT image reconstructions and MIPs were obtained to evaluate the vascular anatomy. Multidetector CT imaging of the abdomen and pelvis was performed using the standard protocol during bolus administration of intravenous contrast. RADIATION DOSE REDUCTION: This exam was performed according to the departmental dose-optimization program which includes automated exposure control, adjustment of the mA and/or kV according to patient size and/or use of iterative reconstruction technique. CONTRAST:  75mL OMNIPAQUE IOHEXOL 350 MG/ML SOLN COMPARISON:  Prior CTA chest 03/29/2023. Abdominopelvic CT of 01/18/2023 FINDINGS: CTA CHEST FINDINGS Cardiovascular: The quality of this exam for evaluation of pulmonary embolism is moderate to good. No evidence of pulmonary embolism. Aortic atherosclerosis. Left carotid artery atherosclerosis as detailed previously. Normal heart size, without pericardial effusion. Lad and right coronary artery calcification. Right-sided central line from an IJ approach terminates in the low SVC. Mediastinum/Nodes: New mild right paratracheal adenopathy at 1.0 cm. Mild prevascular adenopathy at up to 8 mm, similar. Likely reactive. Lungs/Pleura: Trace left pleural fluid.  Endotracheal tube terminates well above the carina. Development of diffuse multifocal ground-glass and airspace disease. The most focal/confluent consolidation is in the left lower lobe. No pulmonary necrosis. Musculoskeletal: Remote bilateral rib fractures. 5th anterior right rib fracture favored to be acute or subacute on 115/4. Review of the MIP images confirms the above findings. CT ABDOMEN and PELVIS FINDINGS Hepatobiliary: Normal liver. Cholecystectomy, without biliary ductal dilatation. Pancreas: Normal, without mass or ductal dilatation. Spleen: Normal in size, without focal abnormality. Adrenals/Urinary Tract: Normal adrenal glands. Normal kidneys, without hydronephrosis. Suprapubic bladder catheter. The pericatheter anterior pelvic wall collection has resolved. Stomach/Bowel: Normal stomach, without wall thickening. Large stool ball in the rectum of 5.7  cm. Portions of abdominal exam are mildly motion degraded. Normal terminal ileum and appendix. Portions of the right-side of the colon appear thick walled, favored to be due to underdistention. Normal small bowel. Vascular/Lymphatic: Advanced aortic and branch vessel atherosclerosis. No abdominopelvic adenopathy. Reproductive: Normal prostate. Other: No significant free fluid.  No free intraperitoneal air. Musculoskeletal: No acute osseous abnormality. Review of the MIP images confirms the above findings. IMPRESSION: CT CHEST IMPRESSION 1.  No evidence of pulmonary embolism. 2. Development of diffuse airspace and ground-glass opacity, most confluent in the left lower lobe since 03/29/2023. Consistent with pneumonia. 3. Age advanced coronary artery atherosclerosis. Recommend assessment of coronary risk factors. 4. Trace left pleural fluid 5.  Aortic Atherosclerosis (ICD10-I70.0). 6. Bilateral primarily remote rib fractures. Anterior fifth rib fracture is favored to be acute or subacute. CT ABDOMEN AND PELVIS IMPRESSION 1. Mild motion degradation throughout.  2. Stool ball in the rectum suggests constipation or even fecal impaction. 3. Suprapubic catheter within the bladder, with resolution of pericatheter fluid collection. The bladder appears thick walled but is underdistended. Correlate with urinalysis to exclude 4. The ascending colon also appears thick walled but is underdistended. Correlate with symptoms of infectious colitis. Cystitis. Electronically Signed   By: Jeronimo Greaves M.D.   On: 04/30/2023 20:37   DG Chest Port 1 View Result Date: 04/30/2023 CLINICAL DATA:  Respiratory failure. EXAM: PORTABLE CHEST 1 VIEW COMPARISON:  Chest radiograph dated 03/24/2023 FINDINGS: Right-sided Port-A-Cath with tip over central SVC. Endotracheal tube approximately 7 cm above the carina. Bilateral diffuse bilateral interstitial and hazy airspace opacities may represent edema, pneumonia, or combination. A small left pleural effusion suspected. No pneumothorax. Top-normal cardiac silhouette. No acute osseous pathology. IMPRESSION: 1. Endotracheal tube approximately 7 cm above the carina. 2. Bilateral diffuse interstitial and hazy airspace opacities may represent edema, pneumonia, or combination. Electronically Signed   By: Elgie Collard M.D.   On: 04/30/2023 20:35   CT HEAD WO CONTRAST Result Date: 04/30/2023 CLINICAL DATA:  Blunt trauma EXAM: CT HEAD WITHOUT CONTRAST TECHNIQUE: Contiguous axial images were obtained from the base of the skull through the vertex without intravenous contrast. RADIATION DOSE REDUCTION: This exam was performed according to the departmental dose-optimization program which includes automated exposure control, adjustment of the mA and/or kV according to patient size and/or use of iterative reconstruction technique. COMPARISON:  09/24/2022 FINDINGS: Brain: No acute traumatic finding. No evidence of hemorrhage, stroke, mass, hydrocephalus or extra-axial collection. Some artifact from overlying support devices. Vascular: No abnormal vascular  finding. Skull: Negative Sinuses/Orbits: Clear/normal Other: None IMPRESSION: No acute or traumatic finding. Some artifact from overlying support devices. Electronically Signed   By: Paulina Fusi M.D.   On: 04/30/2023 19:28   CT Cervical Spine Wo Contrast Result Date: 04/30/2023 CLINICAL DATA:  Blunt trauma EXAM: CT CERVICAL SPINE WITHOUT CONTRAST TECHNIQUE: Multidetector CT imaging of the cervical spine was performed without intravenous contrast. Multiplanar CT image reconstructions were also generated. RADIATION DOSE REDUCTION: This exam was performed according to the departmental dose-optimization program which includes automated exposure control, adjustment of the mA and/or kV according to patient size and/or use of iterative reconstruction technique. COMPARISON:  09/25/2022 FINDINGS: Alignment: No malalignment.  Head is turned towards the left. Skull base and vertebrae: No regional fracture. Chronic fusion of the facet joints on the left at C3-4. Soft tissues and spinal canal: No significant soft tissue finding. Patient is intubated. Disc levels: No disc level abnormality. No stenosis of the canal or foramina. Upper chest: See results  of chest CT. Other: None IMPRESSION: No acute or traumatic finding. Chronic fusion of the facet joints on the left at C3-4. Electronically Signed   By: Paulina Fusi M.D.   On: 04/30/2023 19:26    Procedures .Critical Care  Performed by: Terald Sleeper, MD Authorized by: Terald Sleeper, MD   Critical care provider statement:    Critical care time (minutes):  50   Critical care time was exclusive of:  Separately billable procedures and treating other patients   Critical care was necessary to treat or prevent imminent or life-threatening deterioration of the following conditions:  Cardiac failure and respiratory failure   Critical care was time spent personally by me on the following activities:  Ordering and performing treatments and interventions, ordering and  review of laboratory studies, ordering and review of radiographic studies, pulse oximetry, review of old charts, examination of patient and evaluation of patient's response to treatment Central Line  Date/Time: 04/30/2023 6:35 PM  Performed by: Terald Sleeper, MD Authorized by: Terald Sleeper, MD   Consent:    Consent obtained:  Emergent situation Universal protocol:    Relevant documents present and verified: yes     Test results available: yes     Imaging studies available: yes     Required blood products, implants, devices, and special equipment available: yes     Site/side marked: yes     Immediately prior to procedure, a time out was called: yes     Patient identity confirmed:  Arm band Pre-procedure details:    Indication(s): central venous access     Hand hygiene: Hand hygiene performed prior to insertion     Sterile barrier technique: All elements of maximal sterile technique followed     Skin preparation:  Chlorhexidine   Skin preparation agent: Skin preparation agent completely dried prior to procedure   Sedation:    Sedation type:  None Anesthesia:    Anesthesia method:  None Procedure details:    Location:  R internal jugular   Patient position:  Supine   Procedural supplies:  Triple lumen   Landmarks identified: yes     Ultrasound guidance: yes     Ultrasound guidance timing: real time     Sterile ultrasound techniques: Sterile gel and sterile probe covers were used     Number of attempts:  1   Successful placement: yes   Post-procedure details:    Post-procedure:  Dressing applied and line sutured   Assessment:  Blood return through all ports, free fluid flow, no pneumothorax on x-ray and placement verified by x-ray   Procedure completion:  Tolerated well, no immediate complications     Medications Ordered in ED Medications  EPINEPHrine (ADRENALIN) 5 mg in NS 250 mL (0.02 mg/mL) premix infusion (0.5 mcg/min Intravenous New Bag/Given 04/30/23 1823)   fentaNYL in NS (68mcg/ml) infusion-PREMIX (150 mcg/hr Intravenous Infusion Verify 04/30/23 2023)  fentaNYL (SUBLIMAZE) bolus via infusion 50-100 mcg (has no administration in time range)  0.9 %  sodium chloride infusion (has no administration in time range)  0.9 %  sodium chloride infusion (has no administration in time range)  vancomycin (VANCOREADY) IVPB 1750 mg/350 mL (1,750 mg Intravenous New Bag/Given 04/30/23 2043)  docusate (COLACE) 50 MG/5ML liquid 100 mg (has no administration in time range)  polyethylene glycol (MIRALAX / GLYCOLAX) packet 17 g (has no administration in time range)  heparin injection 5,000 Units (has no administration in time range)  pantoprazole (PROTONIX) injection 40 mg (  has no administration in time range)  insulin aspart (novoLOG) injection 0-15 Units (has no administration in time range)  norepinephrine (LEVOPHED) 4mg  in (0.016 mg/mL) premix infusion (has no administration in time range)  sodium chloride 0.9 % bolus 1,000 mL (0 mLs Intravenous Stopped 04/30/23 2018)  iohexol (OMNIPAQUE) 350 MG/ML injection 75 mL (75 mLs Intravenous Contrast Given 04/30/23 1902)  piperacillin-tazobactam (ZOSYN) IVPB 3.375 g (0 g Intravenous Stopped 04/30/23 2018)    ED Course/ Medical Decision Making/ A&P Clinical Course as of 04/30/23 2115  Mon Apr 30, 2023  1914 Delia Chimes is placed and patient en route to CT scan at this time.  He is on peripheral epinephrine maintaining blood pressure at approximately 110 systolic at this time, also on fentanyl for sedation. [MT]  1839 PCCM consulted - will see patient [MT]  1923 Sister Vincent [name] updated about patient's status. [MT]    Clinical Course User Index [MT] Noel Henandez, Kermit Balo, MD                                 Medical Decision Making Amount and/or Complexity of Data Reviewed Labs: ordered. Radiology: ordered.  Risk Prescription drug management. Decision regarding hospitalization.   This patient presents to  the ED with concern for PEA cardiac arrest in the setting of respiratory complaints. This involves an extensive number of treatment options, and is a complaint that carries with it a high risk of complications and morbidity.  The differential diagnosis includes sepsis pneumonia versus septic shock versus pericardial effusion or tamponade versus pulmonary embolism versus other  Additional history obtained from the patient's sister and paramedics  External records from outside source obtained and reviewed including CT PE study in January that was negative  I ordered and personally interpreted labs.  The pertinent results include: Elevated lactate, venous acidosis, influenza positive.  Significant leukocytosis.  I ordered imaging studies including CT imaging, x-ray imaging I independently visualized and interpreted imaging which showed concern for pulmonary infiltrate, fecal impaction, I agree with the radiologist interpretation  The patient was maintained on a cardiac monitor.  I personally viewed and interpreted the cardiac monitored which showed an underlying rhythm of: Sinus rhythm  Per my interpretation the patient's ECG shows sinus rhythm no acute ischemic findings  I ordered medication including IV fluids and antibiotics per sepsis protocol.  IV sedation medication.  IV vasopressors for blood pressure support  I have reviewed the patients home medicines and have made adjustments as needed  Test Considered: Doubt acute meningitis  I requested consultation with the critical care,  and discussed lab and imaging findings as well as pertinent plan - they recommend: Medical admission  After the interventions noted above, I reevaluated the patient and found that they have: stayed the same -blood pressure remained stable on vasopressor support through central line   Dispostion:  After consideration of the diagnostic results and the patients response to treatment, I feel that the patent would  benefit from medical admission.         Final Clinical Impression(s) / ED Diagnoses Final diagnoses:  Cardiac arrest (HCC)  Hypothermia, initial encounter  Sepsis, due to unspecified organism, unspecified whether acute organ dysfunction present (HCC)  Pneumonia due to infectious organism, unspecified laterality, unspecified part of lung    Rx / DC Orders ED Discharge Orders     None         Terald Sleeper, MD 04/30/23 2115

## 2023-04-30 NOTE — ED Notes (Signed)
Pt transported to 2h with this nurse and Respiratory.

## 2023-04-30 NOTE — Progress Notes (Signed)
Pt transported from [facility] to 2H06 with no complications

## 2023-04-30 NOTE — ED Triage Notes (Signed)
Pt here from home for post CPR. Pt has c/op UTI sx to sister, called sister he wasn't feeling well and sister found pt pulseless and apneic. Fire started CPR, total of 15 min. PEA in 30's. Pt received 2 epis, 500cc of NS, 5mg  of albuterol, 5mg  of Atrovent, 5mg  of versed, and 100 mg of Atrovent.

## 2023-05-01 ENCOUNTER — Inpatient Hospital Stay (HOSPITAL_COMMUNITY): Payer: 59

## 2023-05-01 DIAGNOSIS — I4891 Unspecified atrial fibrillation: Secondary | ICD-10-CM

## 2023-05-01 DIAGNOSIS — J9601 Acute respiratory failure with hypoxia: Secondary | ICD-10-CM | POA: Diagnosis not present

## 2023-05-01 DIAGNOSIS — E440 Moderate protein-calorie malnutrition: Secondary | ICD-10-CM | POA: Insufficient documentation

## 2023-05-01 DIAGNOSIS — A419 Sepsis, unspecified organism: Secondary | ICD-10-CM | POA: Diagnosis not present

## 2023-05-01 DIAGNOSIS — I469 Cardiac arrest, cause unspecified: Secondary | ICD-10-CM | POA: Diagnosis not present

## 2023-05-01 DIAGNOSIS — J09X1 Influenza due to identified novel influenza A virus with pneumonia: Secondary | ICD-10-CM

## 2023-05-01 DIAGNOSIS — R569 Unspecified convulsions: Secondary | ICD-10-CM | POA: Diagnosis not present

## 2023-05-01 DIAGNOSIS — J9602 Acute respiratory failure with hypercapnia: Secondary | ICD-10-CM | POA: Diagnosis not present

## 2023-05-01 LAB — CBC
HCT: 25 % — ABNORMAL LOW (ref 39.0–52.0)
Hemoglobin: 8.2 g/dL — ABNORMAL LOW (ref 13.0–17.0)
MCH: 26.3 pg (ref 26.0–34.0)
MCHC: 32.8 g/dL (ref 30.0–36.0)
MCV: 80.1 fL (ref 80.0–100.0)
Platelets: 340 10*3/uL (ref 150–400)
RBC: 3.12 MIL/uL — ABNORMAL LOW (ref 4.22–5.81)
RDW: 13.9 % (ref 11.5–15.5)
WBC: 22.2 10*3/uL — ABNORMAL HIGH (ref 4.0–10.5)
nRBC: 0 % (ref 0.0–0.2)

## 2023-05-01 LAB — GLUCOSE, CAPILLARY
Glucose-Capillary: 479 mg/dL — ABNORMAL HIGH (ref 70–99)
Glucose-Capillary: 499 mg/dL — ABNORMAL HIGH (ref 70–99)
Glucose-Capillary: 395 mg/dL — ABNORMAL HIGH (ref 70–99)
Glucose-Capillary: 318 mg/dL — ABNORMAL HIGH (ref 70–99)
Glucose-Capillary: 191 mg/dL — ABNORMAL HIGH (ref 70–99)
Glucose-Capillary: 261 mg/dL — ABNORMAL HIGH (ref 70–99)
Glucose-Capillary: 183 mg/dL — ABNORMAL HIGH (ref 70–99)
Glucose-Capillary: 172 mg/dL — ABNORMAL HIGH (ref 70–99)
Glucose-Capillary: 133 mg/dL — ABNORMAL HIGH (ref 70–99)
Glucose-Capillary: 205 mg/dL — ABNORMAL HIGH (ref 70–99)
Glucose-Capillary: 84 mg/dL (ref 70–99)
Glucose-Capillary: 113 mg/dL — ABNORMAL HIGH (ref 70–99)
Glucose-Capillary: 64 mg/dL — ABNORMAL LOW (ref 70–99)
Glucose-Capillary: 113 mg/dL — ABNORMAL HIGH (ref 70–99)
Glucose-Capillary: 106 mg/dL — ABNORMAL HIGH (ref 70–99)

## 2023-05-01 LAB — RAPID URINE DRUG SCREEN, HOSP PERFORMED
Amphetamines: NOT DETECTED
Amphetamines: NOT DETECTED
Barbiturates: NOT DETECTED
Barbiturates: NOT DETECTED
Benzodiazepines: POSITIVE — AB
Benzodiazepines: POSITIVE — AB
Cocaine: NOT DETECTED
Cocaine: NOT DETECTED
Opiates: NOT DETECTED
Opiates: NOT DETECTED
Tetrahydrocannabinol: NOT DETECTED
Tetrahydrocannabinol: NOT DETECTED

## 2023-05-01 LAB — POCT I-STAT 7, (LYTES, BLD GAS, ICA,H+H)
Acid-base deficit: 6 mmol/L — ABNORMAL HIGH (ref 0.0–2.0)
Bicarbonate: 18.3 mmol/L — ABNORMAL LOW (ref 20.0–28.0)
Calcium, Ion: 1.23 mmol/L (ref 1.15–1.40)
HCT: 24 % — ABNORMAL LOW (ref 39.0–52.0)
Hemoglobin: 8.2 g/dL — ABNORMAL LOW (ref 13.0–17.0)
O2 Saturation: 98 %
Patient temperature: 98.6
Potassium: 4.3 mmol/L (ref 3.5–5.1)
Sodium: 141 mmol/L (ref 135–145)
TCO2: 19 mmol/L — ABNORMAL LOW (ref 22–32)
pCO2 arterial: 31.1 mm[Hg] — ABNORMAL LOW (ref 32–48)
pH, Arterial: 7.379 (ref 7.35–7.45)
pO2, Arterial: 99 mm[Hg] (ref 83–108)

## 2023-05-01 LAB — COMPREHENSIVE METABOLIC PANEL
ALT: 17 U/L (ref 0–44)
AST: 22 U/L (ref 15–41)
Albumin: 1.6 g/dL — ABNORMAL LOW (ref 3.5–5.0)
Alkaline Phosphatase: 111 U/L (ref 38–126)
Anion gap: 9 (ref 5–15)
BUN: 19 mg/dL (ref 6–20)
CO2: 18 mmol/L — ABNORMAL LOW (ref 22–32)
Calcium: 7.6 mg/dL — ABNORMAL LOW (ref 8.9–10.3)
Chloride: 112 mmol/L — ABNORMAL HIGH (ref 98–111)
Creatinine, Ser: 1.27 mg/dL — ABNORMAL HIGH (ref 0.61–1.24)
GFR, Estimated: >60 mL/min (ref >60)
Glucose, Bld: 178 mg/dL — ABNORMAL HIGH (ref 70–99)
Potassium: 4.5 mmol/L (ref 3.5–5.1)
Sodium: 139 mmol/L (ref 135–145)
Total Bilirubin: 0.4 mg/dL (ref 0.0–1.2)
Total Protein: 5 g/dL — ABNORMAL LOW (ref 6.5–8.1)

## 2023-05-01 LAB — URINALYSIS, W/ REFLEX TO CULTURE (INFECTION SUSPECTED)
Bilirubin Urine: NEGATIVE
Glucose, UA: NEGATIVE mg/dL
Ketones, ur: NEGATIVE mg/dL
Nitrite: NEGATIVE
Protein, ur: 100 mg/dL — AB
Specific Gravity, Urine: 1.045 — ABNORMAL HIGH (ref 1.005–1.030)
WBC, UA: >50 WBC/hpf (ref 0–5)
pH: 5 (ref 5.0–8.0)

## 2023-05-01 LAB — TSH: TSH: 6.335 u[IU]/mL — ABNORMAL HIGH (ref 0.350–4.500)

## 2023-05-01 LAB — ECHOCARDIOGRAM COMPLETE
AR max vel: 3.04 cm2
AV Area VTI: 3.04 cm2
AV Area mean vel: 2.92 cm2
AV Mean grad: 5 mm[Hg]
AV Peak grad: 8.4 mm[Hg]
Ao pk vel: 1.45 m/s
Area-P 1/2: 5.02 cm2
Height: 72 in
S' Lateral: 3.15 cm
Weight: 2857.16 [oz_av]

## 2023-05-01 LAB — RESPIRATORY PANEL BY PCR
Adenovirus: NOT DETECTED
Bordetella Parapertussis: NOT DETECTED
Bordetella pertussis: NOT DETECTED
Chlamydophila pneumoniae: NOT DETECTED
Coronavirus 229E: NOT DETECTED
Coronavirus HKU1: NOT DETECTED
Coronavirus NL63: NOT DETECTED
Coronavirus OC43: NOT DETECTED
Influenza A: DETECTED — AB
Influenza B: NOT DETECTED
Metapneumovirus: NOT DETECTED
Mycoplasma pneumoniae: NOT DETECTED
Parainfluenza Virus 1: NOT DETECTED
Parainfluenza Virus 2: NOT DETECTED
Parainfluenza Virus 3: NOT DETECTED
Parainfluenza Virus 4: NOT DETECTED
Respiratory Syncytial Virus: NOT DETECTED
Rhinovirus / Enterovirus: NOT DETECTED

## 2023-05-01 LAB — HEMOGLOBIN A1C
Hgb A1c MFr Bld: 9 % — ABNORMAL HIGH (ref 4.8–5.6)
Hgb A1c MFr Bld: 8.6 % — ABNORMAL HIGH (ref 4.8–5.6)
Mean Plasma Glucose: 212 mg/dL
Mean Plasma Glucose: 200.12 mg/dL

## 2023-05-01 LAB — TROPONIN I (HIGH SENSITIVITY)
Troponin I (High Sensitivity): 527 ng/L (ref <18)
Troponin I (High Sensitivity): 390 ng/L (ref <18)
Troponin I (High Sensitivity): 374 ng/L (ref <18)

## 2023-05-01 LAB — T4, FREE: Free T4: 1.12 ng/dL (ref 0.61–1.12)

## 2023-05-01 LAB — MAGNESIUM: Magnesium: 1.4 mg/dL — ABNORMAL LOW (ref 1.7–2.4)

## 2023-05-01 LAB — MRSA NEXT GEN BY PCR, NASAL: MRSA by PCR Next Gen: DETECTED — AB

## 2023-05-01 LAB — PHOSPHORUS: Phosphorus: 3.7 mg/dL (ref 2.5–4.6)

## 2023-05-01 LAB — ETHANOL: Alcohol, Ethyl (B): <10 mg/dL (ref <10)

## 2023-05-01 LAB — TRIGLYCERIDES: Triglycerides: 53 mg/dL (ref <150)

## 2023-05-01 MED ORDER — GUAIFENESIN 100 MG/5ML PO LIQD
10.0000 mL | Freq: Four times a day (QID) | ORAL | Status: DC
  Administered 2023-05-02 (×2): 10 mL via ORAL
  Filled 2023-05-01 (×2): qty 10

## 2023-05-01 MED ORDER — INSULIN GLARGINE-YFGN 100 UNIT/ML ~~LOC~~ SOLN
5.0000 [IU] | Freq: Two times a day (BID) | SUBCUTANEOUS | Status: DC
  Administered 2023-05-01: 5 [IU] via SUBCUTANEOUS
  Filled 2023-05-01 (×2): qty 0.05

## 2023-05-01 MED ORDER — INSULIN ASPART 100 UNIT/ML IJ SOLN: 2.0000 [IU] | INTRAMUSCULAR | Status: DC

## 2023-05-01 MED ORDER — SODIUM CHLORIDE 0.9% FLUSH
10.0000 mL | Freq: Two times a day (BID) | INTRAVENOUS | Status: DC
  Administered 2023-05-01 – 2023-05-06 (×10): 10 mL

## 2023-05-01 MED ORDER — LACTATED RINGERS IV BOLUS
1000.0000 mL | Freq: Once | INTRAVENOUS | Status: AC
  Administered 2023-05-01: 1000 mL via INTRAVENOUS

## 2023-05-01 MED ORDER — MAGNESIUM SULFATE 4 GM/100ML IV SOLN
4.0000 g | Freq: Once | INTRAVENOUS | Status: AC
  Administered 2023-05-01: 4 g via INTRAVENOUS
  Filled 2023-05-01: qty 100

## 2023-05-01 MED ORDER — MUPIROCIN 2 % EX OINT
1.0000 | TOPICAL_OINTMENT | Freq: Two times a day (BID) | CUTANEOUS | Status: AC
  Administered 2023-05-01 – 2023-05-06 (×10): 1 via NASAL
  Filled 2023-05-01: qty 22

## 2023-05-01 MED ORDER — ORAL CARE MOUTH RINSE: 15.0000 mL | OROMUCOSAL | Status: DC | PRN

## 2023-05-01 MED ORDER — ATORVASTATIN CALCIUM 10 MG PO TABS
20.0000 mg | ORAL_TABLET | Freq: Every day | ORAL | Status: DC
  Administered 2023-05-02 – 2023-05-06 (×5): 20 mg
  Filled 2023-05-01 (×5): qty 2

## 2023-05-01 MED ORDER — ROSUVASTATIN CALCIUM 5 MG PO TABS: 10.0000 mg | ORAL_TABLET | Freq: Every day | ORAL | Status: DC

## 2023-05-01 MED ORDER — SODIUM CHLORIDE 3 % IN NEBU
4.0000 mL | INHALATION_SOLUTION | RESPIRATORY_TRACT | Status: DC
  Administered 2023-05-01 – 2023-05-02 (×4): 4 mL via RESPIRATORY_TRACT
  Filled 2023-05-01 (×3): qty 4

## 2023-05-01 MED ORDER — IPRATROPIUM-ALBUTEROL 0.5-2.5 (3) MG/3ML IN SOLN
3.0000 mL | RESPIRATORY_TRACT | Status: DC | PRN
  Administered 2023-05-02: 3 mL via RESPIRATORY_TRACT
  Filled 2023-05-01: qty 3

## 2023-05-01 MED ORDER — SODIUM CHLORIDE 0.9% FLUSH: 10.0000 mL | INTRAVENOUS | Status: DC | PRN

## 2023-05-01 MED ORDER — DEXTROSE 50 % IV SOLN
12.5000 g | Freq: Once | INTRAVENOUS | Status: AC
  Administered 2023-05-01: 12.5 g via INTRAVENOUS

## 2023-05-01 MED ORDER — CLOPIDOGREL BISULFATE 75 MG PO TABS: 75.0000 mg | ORAL_TABLET | Freq: Once | ORAL | Status: DC

## 2023-05-01 MED ORDER — CHLORHEXIDINE GLUCONATE CLOTH 2 % EX PADS
6.0000 | MEDICATED_PAD | Freq: Every day | CUTANEOUS | Status: DC
  Administered 2023-05-01 – 2023-05-08 (×8): 6 via TOPICAL

## 2023-05-01 MED ORDER — SODIUM CHLORIDE 0.9 % IV SOLN
100.0000 mg | Freq: Two times a day (BID) | INTRAVENOUS | Status: DC
  Administered 2023-05-01 – 2023-05-04 (×7): 100 mg via INTRAVENOUS
  Filled 2023-05-01 (×7): qty 100

## 2023-05-01 MED ORDER — ORAL CARE MOUTH RINSE
15.0000 mL | OROMUCOSAL | Status: DC
  Administered 2023-05-01 – 2023-05-06 (×57): 15 mL via OROMUCOSAL

## 2023-05-01 MED ORDER — OSELTAMIVIR PHOSPHATE 75 MG PO CAPS
75.0000 mg | ORAL_CAPSULE | Freq: Two times a day (BID) | ORAL | Status: DC
  Filled 2023-05-01 (×3): qty 1

## 2023-05-01 NOTE — Consult Note (Signed)
Urology Consult Note   Requesting Attending Physician:  Steffanie Dunn, DO Service Providing Consult: Urology  Consulting Attending: Dr. Berneice Heinrich   Reason for Consult:  obstructed SPT  HPI: Vincent [name] is seen in consultation for reasons noted above at the request of Steffanie Dunn, DO.  Patient is a 58 year old male presenting to [name] emergency department on 2/17 following out-of-hospital PEA arrest.  He was recently discharged home from rehabilitation SNF and tested positive for influenza.  His sister reports him not feeling well and having UTI symptoms.  On the evening of his admission he was found unresponsive and apneic ROSC was obtained after 15 minutes of ACLS and patient remains in critical condition.  Vincent [name] is known to our practice and is followed by Dr. Joyce Gross for intermediate risk nonmuscle invasive bladder cancer and atonic bladder, for which he has a suprapubic tube.  [facility] was consulted to assist with low urine output secondary to obstructed Foley catheter, or possibly tubing.   ------------------  Assessment:  58 y.o. male in ICU s/ PEA arrest with report of reduced UOP 2/2 obstructed foley.   Recommendations: #atonic bladder w/ SPT #bladder cancer Patient's tubing or catheter reported to be obstructed d/t sediment.  Drainage tubing did not appear obstructed but can be exchanged in seconds just like a regular Foley catheter.  This was demonstrated with PA.  Flushed SPT, which was patent.  I received this same consult with the same outcome last admission.  I would request that nursing be encouraged to explore basic troubleshooting such as catheter flushing and bladder scans as first line intervention going forward.  We will not need to follow. Please call with questions.   Exchange SPT every 30 days.   Case and plan discussed with Dr. Berneice Heinrich  Past Medical History: Past Medical History:  Diagnosis Date   Anxiety    Benign essential tremor     Bipolar 2 disorder [facility])    followed by [facility]--- dr s. Lolly Mustache   Bladder cancer [facility])    recurrent   CAD (coronary artery disease)    cardiac cath 2003  and 2011 both showed normal coronary arteries w/ preserved lvf;  Non obstructive on CTA Oct 2019.    Chronic pain syndrome    back---- [facility] [place]   Cold extremities    BLE   COPD (chronic obstructive pulmonary disease) (HCC)    DDD (degenerative disc disease), lumbar    Diabetic peripheral neuropathy (HCC)    Gastroparesis    followed by dr Marina Goodell   GERD (gastroesophageal reflux disease)    Hiatal hernia    History of bladder cancer urologist-  previously dr Ronal Fear;  now dr gay   papillay TCC (Ta G1)  s/p TURBT and chemo instillation 2014   History of chest pain 12/2017   heart cath normal   History of encephalopathy 05/27/2015   admission w/ acute encephalopathy thought to be secondary to pain meds and COPD   History of gastric ulcer    History of Helicobacter pylori infection    History of kidney stones    History of TIA (transient ischemic attack) 2008  and 10-19-2018    no residual's   History of traumatic head injury 2010   w/ LOC  per pt needed stitches, hit in head with a mower blade   Hyperlipidemia    Hypertension    Hypogonadism male    s/p  bilateral orchiectomy  Hypothyroidism    Insomnia    Mild obstructive sleep apnea    study in epic 12-04-2016, no cpap   PTSD (post-traumatic stress disorder)    chronic   PTSD (post-traumatic stress disorder)    RA (rheumatoid arthritis) (HCC)    followed by [facility]   Seizures, transient [facility]) neurologist-  dr Terrace Arabia--  differential dx complex partial seizure .vs.  mood disorder .vs.  pseudoseizure--  negative EEG's   confusion episodes and staring spells since 11/ 2015   (03-26-2020 per pt wife last seizure 10 /2021)   Suicide attempt by drug overdose (HCC) 07/28/2021   Transient confusion NEUOROLOGIST-  DR Terrace Arabia   Episodes since  11/ 2015--  neurologist dx  differential complex partial seizure  .vs. mood disorder . vs. pseudoseizure   Type 2 diabetes mellitus treated with insulin [facility])    endocrinologist--- dr Everardo All---  (03-26-2020 pt does not check blood sugar at home)    Past Surgical History:  Past Surgical History:  Procedure Laterality Date   AMPUTATION Left 04/28/2020   Procedure: LEFT LITTLE FINGER AMPUTATION;  Surgeon: Nadara Mustard, MD;  Location: [facility];  Service: Orthopedics;  Laterality: Left;   CARDIAC CATHETERIZATION  12-27-2001  DR Jacinto Halim  &  05-26-2009  DR Eldridge Dace   RESULTS FOR BOTH ARE NORMAL CORONARIES AND PERSERVED LVF/ EF 60%   CARPAL TUNNEL RELEASE Bilateral right 09-16-2003;  left ?   CARPAL TUNNEL RELEASE Left 02/25/2015   Procedure: LEFT CARPAL TUNNEL RELEASE;  Surgeon: Betha Loa, MD;  Location: [facility];  Service: Orthopedics;  Laterality: Left;   CYSTOSCOPY N/A 10/10/2012   Procedure: CYSTOSCOPY CLOT EVACUATION FULGERATION OF BLEEDERS ;  Surgeon: Garnett Farm, MD;  [facility];  Service: Urology;  Laterality: N/A;   CYSTOSCOPY WITH BIOPSY N/A 11/26/2015   Procedure: CYSTOSCOPY WITH BIOPSY AND FULGURATION;  Surgeon: Ihor Gully, MD;  Location: [facility];  Service: Urology;  Laterality: N/A;   ESOPHAGOGASTRODUODENOSCOPY  2014   IR CATHETER TUBE CHANGE  02/06/2023   IR SINUS/FIST TUBE CHK-NON GI  02/06/2023   IR US GUIDE BX ASP/DRAIN  01/19/2023   IRRIGATION AND DEBRIDEMENT ABSCESS Right 09/27/2022   Procedure: IRRIGATION AND DEBRIDEMENT OF RIGHT BUTTOCK  ABSCESS;  Surgeon: Griselda Miner, MD;  Location: [facility];  Service: General;  Laterality: Right;   LAPAROSCOPIC CHOLECYSTECTOMY  11-17-2010   ORCHIECTOMY Right 02/21/2016   Procedure: SCROTAL ORCHIECTOMY with TESTICULAR PROSTHESIS IMPLANT;  Surgeon: Ihor Gully, MD;  [facility];  Service: Urology;  Laterality: Right;   ORCHIECTOMY Left 09/02/2018    Procedure: ORCHIECTOMY;  Surgeon: Ihor Gully, MD;  Location: [facility];  Service: Urology;  Laterality: Left;   ROTATOR CUFF REPAIR Right 12/2004   TRANSURETHRAL RESECTION OF BLADDER TUMOR N/A 08/09/2012   Procedure: TRANSURETHRAL RESECTION OF BLADDER TUMOR (TURBT) WITH GYRUS WITH MITOMYCIN C;  Surgeon: Garnett Farm, MD;  [facility];  Service: Urology;  Laterality: N/A;   TRANSURETHRAL RESECTION OF BLADDER TUMOR N/A 03/29/2020   Procedure: TRANSURETHRAL RESECTION OF BLADDER TUMOR (TURBT) and post-op instillation of gemcitabine;  Surgeon: [name], MD;  Location: [facility];  Service: Urology;  Laterality: N/A;   TRANSURETHRAL RESECTION OF BLADDER TUMOR WITH GYRUS (TURBT-GYRUS) N/A 02/27/2014   Procedure: TRANSURETHRAL RESECTION OF BLADDER TUMOR WITH GYRUS (TURBT-GYRUS);  Surgeon: Garnett Farm, MD;  Location: [name];  Service: Urology;  Laterality: N/A;  Medication: Current Facility-Administered Medications  Medication Dose Route Frequency Provider Last Rate Last Admin   0.9 %  sodium chloride infusion   Intra-arterial PRN Trifan, Kermit Balo, MD       atorvastatin (LIPITOR) tablet 20 mg  20 mg Per Tube Daily Lidia Collum, PA-C       Chlorhexidine Gluconate Cloth 2 % PADS 6 each  6 each Topical Daily Briant Sites, DO   6 each at 05/01/23 8295   clopidogrel (PLAVIX) tablet 75 mg  75 mg Per Tube Once Pia Mau D, PA-C       dextrose 50 % solution 0-50 mL  0-50 mL Intravenous PRN Cloyd Stagers M, PA-C       docusate (COLACE) 50 MG/5ML liquid 100 mg  100 mg Per Tube BID Cloyd Stagers M, PA-C       doxycycline (VIBRAMYCIN) 100 mg in sodium chloride 0.9 % 250 mL IVPB  100 mg Intravenous Q12H Pia Mau D, PA-C   Stopped at 05/01/23 1136   fentaNYL (SUBLIMAZE) bolus via infusion 50-100 mcg  50-100 mcg Intravenous Q15 min PRN Terald Sleeper, MD   100 mcg at 04/30/23 2258   fentaNYL in NS  (87mcg/ml) infusion-PREMIX  50-200 mcg/hr Intravenous Continuous Terald Sleeper, MD 7.5 mL/hr at 05/01/23 1400 75 mcg/hr at 05/01/23 1400   guaiFENesin (ROBITUSSIN) 100 MG/5ML liquid 10 mL  10 mL Oral Q6H Pia Mau D, PA-C       heparin injection 5,000 Units  5,000 Units Subcutaneous Q8H Reese, Stephanie M, PA-C       ipratropium-albuterol (DUONEB) 0.5-2.5 (3) MG/3ML nebulizer solution 3 mL  3 mL Nebulization Q4H PRN Pia Mau D, PA-C       norepinephrine (LEVOPHED) 4mg  in (0.016 mg/mL) premix infusion  0-40 mcg/min Intravenous Titrated Cloyd Stagers M, PA-C 3.75 mL/hr at 05/01/23 1400 1 mcg/min at 05/01/23 1400   oseltamivir (TAMIFLU) capsule 75 mg  75 mg Per Tube BID Karie Fetch P, DO       pantoprazole (PROTONIX) injection 40 mg  40 mg Intravenous QHS Cloyd Stagers M, PA-C   40 mg at 04/30/23 2330   piperacillin-tazobactam (ZOSYN) IVPB 3.375 g  3.375 g Intravenous Q8H Arabella Merles, RPH 12.5 mL/hr at 05/01/23 1400 Infusion Verify at 05/01/23 1400   polyethylene glycol (MIRALAX / GLYCOLAX) packet 17 g  17 g Per Tube Daily Cloyd Stagers M, PA-C       propofol (DIPRIVAN) 1000 MG/100ML infusion  5-80 mcg/kg/min Intravenous Titrated Larinda Buttery, MD   Stopped at 05/01/23 6213   rosuvastatin (CRESTOR) tablet 10 mg  10 mg Per Tube QHS Pia Mau D, PA-C       sodium chloride flush (NS) 0.9 % injection 10-40 mL  10-40 mL Intracatheter Q12H Briant Sites, DO   10 mL at 05/01/23 0865   sodium chloride flush (NS) 0.9 % injection 10-40 mL  10-40 mL Intracatheter PRN Briant Sites, DO       vancomycin (VANCOCIN) IVPB 1000 mg/200 mL premix  1,000 mg Intravenous Q12H Arabella Merles, [facility]   Stopped at 05/01/23 1030    Allergies: Allergies  Allergen Reactions   Celecoxib Anaphylaxis, Swelling, Rash and Other (See Comments)    Tongue swells   Sucralfate Anaphylaxis, Rash and Other (See Comments)    Broke out in a rash with hives that left scars on his legs   Hydrocodone  Rash and Other (See Comments)    "Blisters developed on arms"; tolerated Norco okay (  per sister)    Sulfa Antibiotics Rash    Social History: Social History   Tobacco Use   Smoking status: Every Day    Current packs/day: 1.50    Average packs/day: 1.5 packs/day for 38.0 years (57.0 ttl pk-yrs)    Types: Cigarettes   Smokeless tobacco: Never  Vaping Use   Vaping status: Never Used  Substance Use Topics   Alcohol use: Yes    Comment: occ   Drug use: No    Family History Family History  Problem Relation Age of Onset   Diabetes Mother    Diabetes Father    Hypertension Father    Heart attack Father 92       died age 40   Alcohol abuse Father    Colon cancer Neg Hx    Esophageal cancer Neg Hx    Stomach cancer Neg Hx    Rectal cancer Neg Hx     Review of Systems  Unable to perform ROS: Critical illness     Objective   Vital signs in last 24 hours: BP (!) 120/57   Pulse 79   Temp (!) 96.8 F (36 C) (Esophageal)   Resp 11   Ht 6' (1.829 m)   Wt 81 kg   SpO2 97%   BMI 24.22 kg/m   Physical Exam General: A&O, resting, appropriate HEENT: [place]/AT Pulmonary: Normal work of breathing Cardiovascular: no cyanosis Abdomen: Soft, NTTP, nondistended GU: SPT in place draining cloudy yellow urine. Sediment in tubing  Most Recent Labs: Lab Results  Component Value Date   WBC 22.2 (H) 05/01/2023   HGB 8.2 (L) 05/01/2023   HCT 25.0 (L) 05/01/2023   PLT 340 05/01/2023    Lab Results  Component Value Date   NA 139 05/01/2023   K 4.5 05/01/2023   CL 112 (H) 05/01/2023   CO2 18 (L) 05/01/2023   BUN 19 05/01/2023   CREATININE 1.27 (H) 05/01/2023   CALCIUM 7.6 (L) 05/01/2023   MG 1.4 (L) 05/01/2023   PHOS 3.7 05/01/2023    Lab Results  Component Value Date   INR 1.3 (H) 04/30/2023   APTT 48 (H) 04/30/2023     Urine Culture: @[id number] (laburin,org,[id number],[id number])@   IMAGING: EEG adult Result Date: 05/01/2023 Charlsie Quest, MD     05/01/2023 12:27 PM  Patient Name: ANATOLE APOLLO MRN: [id number] Epilepsy Attending: Charlsie Quest Referring Physician/Provider: Lidia Collum, PA-C Date: 05/01/2023 Duration: 22.33 mins Patient history: 58yo M s/p cardiac arrest. EEG to evaluate for seizure Level of alertness: lethargic/ awake AEDs during EEG study: Propofol Technical aspects: This EEG study was done with scalp electrodes positioned according to the 10-20 International system of electrode placement. Electrical activity was reviewed with band pass filter of 1-70Hz , sensitivity of 7 uV/mm, display speed of 67mm/sec with a 60Hz  notched filter applied as appropriate. EEG data were recorded continuously and digitally stored.  Video monitoring was available and reviewed as appropriate. Description: EEG showed continuous generalized 3 to 6 Hz theta-delta slowing. Hyperventilation and photic stimulation were not performed.   ABNORMALITY - Continuous slow, generalized IMPRESSION: This study is suggestive of moderate diffuse encephalopathy. No seizures or epileptiform discharges were seen throughout the recording. Charlsie Quest   DG Chest Port 1 View Result Date: 05/01/2023 CLINICAL DATA:  Mm dependence. EXAM: PORTABLE CHEST 1 VIEW COMPARISON:  05/01/2023 FINDINGS: Endotracheal tube tip is 3.7 cm above the base of the carina. Right IJ central line tip projects at the mid SVC level.  The cardiopericardial silhouette is within normal limits for size. Diffuse interstitial opacity suggests edema. Similar left base collapse/consolidation with layering left pleural effusion. Telemetry leads overlie the chest. IMPRESSION: Pulmonary edema. Similar left base collapse/consolidation with layering left pleural effusion. Electronically Signed   By: Kennith Center M.D.   On: 05/01/2023 06:11   DG Chest Port 1 View Result Date: 05/01/2023 CLINICAL DATA:  Check endotracheal tube placement EXAM: PORTABLE CHEST 1 VIEW COMPARISON:  Film from the previous day. FINDINGS: Endotracheal tube  lies 5.5 cm above the carina slightly advanced when compared with the prior exam. Aortic calcifications are seen. Right jugular central line is noted in the mid superior vena cava. Hazy opacities are again identified throughout both lungs most consistent with edema. Left retrocardiac consolidation is noted stable from the prior study. IMPRESSION: Stable changes of edema. Endotracheal tube has been advanced in the interval. Electronically Signed   By: Alcide Clever M.D.   On: 05/01/2023 00:56   DG Abd Portable 1V Result Date: 04/30/2023 CLINICAL DATA:  OG tube placement. EXAM: PORTABLE ABDOMEN - 1 VIEW COMPARISON:  CT earlier today FINDINGS: No enteric tube is seen in the upper abdomen or lower chest. There multiple overlying monitoring devices in place. Mild gaseous gastric distension. IMPRESSION: No enteric tube is seen in the upper abdomen or lower chest. Recommend removal and replacement. If a tube has been placed, at the time of the exam recommend repositioning. Electronically Signed   By: Narda Rutherford M.D.   On: 04/30/2023 20:38   CT Angio Chest PE W and/or Wo Contrast Result Date: 04/30/2023 CLINICAL DATA:  Mental status change. Chest wall pain. "Malignancy known or suspected". Abdominal pain. Right upper quadrant. EXAM: CT ANGIOGRAPHY CHEST CT ABDOMEN AND PELVIS WITH CONTRAST TECHNIQUE: Multidetector CT imaging of the chest was performed using the standard protocol during bolus administration of intravenous contrast. Multiplanar CT image reconstructions and MIPs were obtained to evaluate the vascular anatomy. Multidetector CT imaging of the abdomen and pelvis was performed using the standard protocol during bolus administration of intravenous contrast. RADIATION DOSE REDUCTION: This exam was performed according to the departmental dose-optimization program which includes automated exposure control, adjustment of the mA and/or kV according to patient size and/or use of iterative reconstruction  technique. CONTRAST:  75mL OMNIPAQUE IOHEXOL 350 MG/ML SOLN COMPARISON:  Prior CTA chest 03/29/2023. Abdominopelvic CT of 01/18/2023 FINDINGS: CTA CHEST FINDINGS Cardiovascular: The quality of this exam for evaluation of pulmonary embolism is moderate to good. No evidence of pulmonary embolism. Aortic atherosclerosis. Left carotid artery atherosclerosis as detailed previously. Normal heart size, without pericardial effusion. Lad and right coronary artery calcification. Right-sided central line from an IJ approach terminates in the low SVC. Mediastinum/Nodes: New mild right paratracheal adenopathy at 1.0 cm. Mild prevascular adenopathy at up to 8 mm, similar. Likely reactive. Lungs/Pleura: Trace left pleural fluid. Endotracheal tube terminates well above the carina. Development of diffuse multifocal ground-glass and airspace disease. The most focal/confluent consolidation is in the left lower lobe. No pulmonary necrosis. Musculoskeletal: Remote bilateral rib fractures. 5th anterior right rib fracture favored to be acute or subacute on 115/4. Review of the MIP images confirms the above findings. CT ABDOMEN and PELVIS FINDINGS Hepatobiliary: Normal liver. Cholecystectomy, without biliary ductal dilatation. Pancreas: Normal, without mass or ductal dilatation. Spleen: Normal in size, without focal abnormality. Adrenals/Urinary Tract: Normal adrenal glands. Normal kidneys, without hydronephrosis. Suprapubic bladder catheter. The pericatheter anterior pelvic wall collection has resolved. Stomach/Bowel: Normal stomach, without wall thickening. Large stool ball  in the rectum of 5.7 cm. Portions of abdominal exam are mildly motion degraded. Normal terminal ileum and appendix. Portions of the right-side of the colon appear thick walled, favored to be due to underdistention. Normal small bowel. Vascular/Lymphatic: Advanced aortic and branch vessel atherosclerosis. No abdominopelvic adenopathy. Reproductive: Normal prostate.  Other: No significant free fluid.  No free intraperitoneal air. Musculoskeletal: No acute osseous abnormality. Review of the MIP images confirms the above findings. IMPRESSION: CT CHEST IMPRESSION 1.  No evidence of pulmonary embolism. 2. Development of diffuse airspace and ground-glass opacity, most confluent in the left lower lobe since 03/29/2023. Consistent with pneumonia. 3. Age advanced coronary artery atherosclerosis. Recommend assessment of coronary risk factors. 4. Trace left pleural fluid 5.  Aortic Atherosclerosis (ICD10-I70.0). 6. Bilateral primarily remote rib fractures. Anterior fifth rib fracture is favored to be acute or subacute. CT ABDOMEN AND PELVIS IMPRESSION 1. Mild motion degradation throughout. 2. Stool ball in the rectum suggests constipation or even fecal impaction. 3. Suprapubic catheter within the bladder, with resolution of pericatheter fluid collection. The bladder appears thick walled but is underdistended. Correlate with urinalysis to exclude 4. The ascending colon also appears thick walled but is underdistended. Correlate with symptoms of infectious colitis. Cystitis. Electronically Signed   By: Jeronimo Greaves M.D.   On: 04/30/2023 20:37   CT ABDOMEN W CONTRAST Result Date: 04/30/2023 CLINICAL DATA:  Mental status change. Chest wall pain. "Malignancy known or suspected". Abdominal pain. Right upper quadrant. EXAM: CT ANGIOGRAPHY CHEST CT ABDOMEN AND PELVIS WITH CONTRAST TECHNIQUE: Multidetector CT imaging of the chest was performed using the standard protocol during bolus administration of intravenous contrast. Multiplanar CT image reconstructions and MIPs were obtained to evaluate the vascular anatomy. Multidetector CT imaging of the abdomen and pelvis was performed using the standard protocol during bolus administration of intravenous contrast. RADIATION DOSE REDUCTION: This exam was performed according to the departmental dose-optimization program which includes automated exposure  control, adjustment of the mA and/or kV according to patient size and/or use of iterative reconstruction technique. CONTRAST:  75mL OMNIPAQUE IOHEXOL 350 MG/ML SOLN COMPARISON:  Prior CTA chest 03/29/2023. Abdominopelvic CT of 01/18/2023 FINDINGS: CTA CHEST FINDINGS Cardiovascular: The quality of this exam for evaluation of pulmonary embolism is moderate to good. No evidence of pulmonary embolism. Aortic atherosclerosis. Left carotid artery atherosclerosis as detailed previously. Normal heart size, without pericardial effusion. Lad and right coronary artery calcification. Right-sided central line from an IJ approach terminates in the low SVC. Mediastinum/Nodes: New mild right paratracheal adenopathy at 1.0 cm. Mild prevascular adenopathy at up to 8 mm, similar. Likely reactive. Lungs/Pleura: Trace left pleural fluid. Endotracheal tube terminates well above the carina. Development of diffuse multifocal ground-glass and airspace disease. The most focal/confluent consolidation is in the left lower lobe. No pulmonary necrosis. Musculoskeletal: Remote bilateral rib fractures. 5th anterior right rib fracture favored to be acute or subacute on 115/4. Review of the MIP images confirms the above findings. CT ABDOMEN and PELVIS FINDINGS Hepatobiliary: Normal liver. Cholecystectomy, without biliary ductal dilatation. Pancreas: Normal, without mass or ductal dilatation. Spleen: Normal in size, without focal abnormality. Adrenals/Urinary Tract: Normal adrenal glands. Normal kidneys, without hydronephrosis. Suprapubic bladder catheter. The pericatheter anterior pelvic wall collection has resolved. Stomach/Bowel: Normal stomach, without wall thickening. Large stool ball in the rectum of 5.7 cm. Portions of abdominal exam are mildly motion degraded. Normal terminal ileum and appendix. Portions of the right-side of the colon appear thick walled, favored to be due to underdistention. Normal small bowel. Vascular/Lymphatic: Advanced  aortic and branch vessel atherosclerosis. No abdominopelvic adenopathy. Reproductive: Normal prostate. Other: No significant free fluid.  No free intraperitoneal air. Musculoskeletal: No acute osseous abnormality. Review of the MIP images confirms the above findings. IMPRESSION: CT CHEST IMPRESSION 1.  No evidence of pulmonary embolism. 2. Development of diffuse airspace and ground-glass opacity, most confluent in the left lower lobe since 03/29/2023. Consistent with pneumonia. 3. Age advanced coronary artery atherosclerosis. Recommend assessment of coronary risk factors. 4. Trace left pleural fluid 5.  Aortic Atherosclerosis (ICD10-I70.0). 6. Bilateral primarily remote rib fractures. Anterior fifth rib fracture is favored to be acute or subacute. CT ABDOMEN AND PELVIS IMPRESSION 1. Mild motion degradation throughout. 2. Stool ball in the rectum suggests constipation or even fecal impaction. 3. Suprapubic catheter within the bladder, with resolution of pericatheter fluid collection. The bladder appears thick walled but is underdistended. Correlate with urinalysis to exclude 4. The ascending colon also appears thick walled but is underdistended. Correlate with symptoms of infectious colitis. Cystitis. Electronically Signed   By: Jeronimo Greaves M.D.   On: 04/30/2023 20:37   DG Chest Port 1 View Result Date: 04/30/2023 CLINICAL DATA:  Respiratory failure. EXAM: PORTABLE CHEST 1 VIEW COMPARISON:  Chest radiograph dated 03/24/2023 FINDINGS: Right-sided Port-A-Cath with tip over central SVC. Endotracheal tube approximately 7 cm above the carina. Bilateral diffuse bilateral interstitial and hazy airspace opacities may represent edema, pneumonia, or combination. A small left pleural effusion suspected. No pneumothorax. Top-normal cardiac silhouette. No acute osseous pathology. IMPRESSION: 1. Endotracheal tube approximately 7 cm above the carina. 2. Bilateral diffuse interstitial and hazy airspace opacities may represent  edema, pneumonia, or combination. Electronically Signed   By: Elgie Collard M.D.   On: 04/30/2023 20:35   CT HEAD WO CONTRAST Result Date: 04/30/2023 CLINICAL DATA:  Blunt trauma EXAM: CT HEAD WITHOUT CONTRAST TECHNIQUE: Contiguous axial images were obtained from the base of the skull through the vertex without intravenous contrast. RADIATION DOSE REDUCTION: This exam was performed according to the departmental dose-optimization program which includes automated exposure control, adjustment of the mA and/or kV according to patient size and/or use of iterative reconstruction technique. COMPARISON:  09/24/2022 FINDINGS: Brain: No acute traumatic finding. No evidence of hemorrhage, stroke, mass, hydrocephalus or extra-axial collection. Some artifact from overlying support devices. Vascular: No abnormal vascular finding. Skull: Negative Sinuses/Orbits: Clear/normal Other: None IMPRESSION: No acute or traumatic finding. Some artifact from overlying support devices. Electronically Signed   By: Paulina Fusi M.D.   On: 04/30/2023 19:28   CT Cervical Spine Wo Contrast Result Date: 04/30/2023 CLINICAL DATA:  Blunt trauma EXAM: CT CERVICAL SPINE WITHOUT CONTRAST TECHNIQUE: Multidetector CT imaging of the cervical spine was performed without intravenous contrast. Multiplanar CT image reconstructions were also generated. RADIATION DOSE REDUCTION: This exam was performed according to the departmental dose-optimization program which includes automated exposure control, adjustment of the mA and/or kV according to patient size and/or use of iterative reconstruction technique. COMPARISON:  09/25/2022 FINDINGS: Alignment: No malalignment.  Head is turned towards the left. Skull base and vertebrae: No regional fracture. Chronic fusion of the facet joints on the left at C3-4. Soft tissues and spinal canal: No significant soft tissue finding. Patient is intubated. Disc levels: No disc level abnormality. No stenosis of the canal  or foramina. Upper chest: See results of chest CT. Other: None IMPRESSION: No acute or traumatic finding. Chronic fusion of the facet joints on the left at C3-4. Electronically Signed   By: Paulina Fusi M.D.   On: 04/30/2023  19:26    ------  Elmon Kirschner, NP Pager: [phone]   Please contact the urology consult pager with any further questions/concerns.

## 2023-05-01 NOTE — Plan of Care (Signed)
  Problem: Coping: Goal: Ability to adjust to condition or change in health will improve Outcome: Progressing   Problem: Fluid Volume: Goal: Ability to maintain a balanced intake and output will improve Outcome: Progressing   Problem: Metabolic: Goal: Ability to maintain appropriate glucose levels will improve Outcome: Progressing   Problem: Tissue Perfusion: Goal: Adequacy of tissue perfusion will improve Outcome: Progressing   

## 2023-05-01 NOTE — Progress Notes (Signed)
 EEG complete - results pending

## 2023-05-01 NOTE — Consult Note (Addendum)
WOC Nurse Consult Note: Reason for Consult: Requested to assess bilateral heels. Performed remotely after photos and notes. Wound type: Pressure injury. DTPI on the left. Right heel a unstageable wound. Pressure Injury POA: Yes Measurement: Left heel - aprox. 3 cm x 4 cm purple skin with no breakdown. No drainage. Measurement: Right heel - aprox. 8 cm x 6 cm. Black eschar on the middle aprox. 3 cm x 2.5 cm Wound bed: 80% red, 20% black eschar. Drainage (amount, consistency, odor)  Periwound: intact. Dressing procedure/placement/frequency: Left heel - apply foam dressing, check daily and change every 3 days. Right heel - Apply Xeroform (change daily), cover with foam dressing, change every 3 days or PRN.  Use Prevalon boots on bilateral heels to relieve the pressure and prevent further injury.  WOC team will not plan to follow further.  Please reconsult if further assistance is needed. Thank-you,  Denyse Amass BSN, RN, [employer], WOC  (Pager: [phone])

## 2023-05-01 NOTE — Progress Notes (Signed)
*  PRELIMINARY RESULTS* Echocardiogram 2D Echocardiogram has been performed.  Vincent [name] 05/01/2023, 2:38 PM

## 2023-05-01 NOTE — Procedures (Addendum)
Patient Name: DELMO MATTY  MRN: [id number]  Epilepsy Attending: Charlsie Quest  Referring Physician/Provider: Lidia Collum, PA-C  Date: 05/01/2023 Duration: 22.33 mins  Patient history: 58yo M s/p cardiac arrest. EEG to evaluate for seizure  Level of alertness: lethargic/ awake  AEDs during EEG study: Propofol  Technical aspects: This EEG study was done with scalp electrodes positioned according to the 10-20 International system of electrode placement. Electrical activity was reviewed with band pass filter of 1-70Hz , sensitivity of 7 uV/mm, display speed of 54mm/sec with a 60Hz  notched filter applied as appropriate. EEG data were recorded continuously and digitally stored.  Video monitoring was available and reviewed as appropriate.  Description: EEG showed continuous generalized 3 to 6 Hz theta-delta slowing. Hyperventilation and photic stimulation were not performed.     ABNORMALITY - Continuous slow, generalized  IMPRESSION: This study is suggestive of moderate diffuse encephalopathy. No seizures or epileptiform discharges were seen throughout the recording.  Ihor Meinzer Annabelle Harman

## 2023-05-01 NOTE — Progress Notes (Signed)
NAME:  Vincent [name], MRN:  [id number], DOB:  Mar 09, 1966, LOS: 1 ADMISSION DATE:  04/30/2023 CONSULTATION DATE:  04/30/2023 REFERRING MD:  Renaye Rakers - EDP, CHIEF COMPLAINT:  Cardiac arrest   History of Present Illness:  58 year old man who presented to [facility] 2/17 after OOH cardiac arrest. PMHx significant for HTN, HLD, CAD, TIA, COPD, mild OSA, T2DM (c/b gastroparesis and diabetic peripheral neuropathy), hypothyroidism, GERD, PUD/H. Pylori), bladder CA, hypogonadism (s/p bilateral orchiectomy), DDD with chronic back pain, RA, benign essential tremor, transient seizure vs. pseudoseizure, bipolar disorder, anxiety, PTSD.  Patient is intubated, therefore history is obtained from chart review and from patient's sister/caregiver (at bedside). Patient was reportedly discharged home from rehab SNF ~ 1 week PTA; tested positive for influenza at the facility and facility MD called Tamiflu in. Reportedly taking Tamiflu at home. Patient had also reported UTI symptoms to his sister and complained of not feeling well. On evening of admission, patient was found unresponsive and apneic. CPR started by sister with coaching from 911 dispatcher. Fire arrived and continued CPR; on EMS arrival initial rhythm was PEA. ROSC obtained after 15 mins of ACLS measures including Epi x 2, NS bolus, nebs and intubation.   On arrival to ER, patient had a GCS of 3 and was requiring Epi gtt. Labs were notable for WBC 37, Hgb 8.5 (near baseline), Plt 444. Na 139, K 3.3, CO2 17, Cr 1.25 (baseline 0.8-1). Glucose 509. Transaminases WNL, Alk Phos 136, Tbili 0.5. INR 1.3. Trop 13. PCT 0.42. Flu A+. BCx, UCx and Resp Cx pending. Broad-spectrum Vanc/Zosyn started. CXR demonstrated bilateral diffuse interstitial hazy airspace opacities. CT Head NAICA, C-spine without acute findings. CTA PE Protocol negative for PE, +diffuse airspace/GGO most prominent in LLL c/w PNA and trace L pleural fluid, bilateral rib fractures. CT Abd with stool ball in  rectum c/f fecal impaction, +suprapubic catheter within thick-walled bladder. Of noted, at the time of exam, patient was awake and intermittently following commands.  PCCM consulted for ICU admission.  Pertinent Medical History:   Past Medical History:  Diagnosis Date   Anxiety    Benign essential tremor    Bipolar 2 disorder (HCC)    followed by [facility]--- dr s. Lolly Mustache   Bladder cancer [facility])    recurrent   CAD (coronary artery disease)    cardiac cath 2003  and 2011 both showed normal coronary arteries w/ preserved lvf;  Non obstructive on CTA Oct 2019.    Chronic pain syndrome    back---- [facility] [place]   Cold extremities    BLE   COPD (chronic obstructive pulmonary disease) (HCC)    DDD (degenerative disc disease), lumbar    Diabetic peripheral neuropathy (HCC)    Gastroparesis    followed by dr Marina Goodell   GERD (gastroesophageal reflux disease)    Hiatal hernia    History of bladder cancer urologist-  previously dr Ronal Fear;  now dr gay   papillay TCC (Ta G1)  s/p TURBT and chemo instillation 2014   History of chest pain 12/2017   heart cath normal   History of encephalopathy 05/27/2015   admission w/ acute encephalopathy thought to be secondary to pain meds and COPD   History of gastric ulcer    History of Helicobacter pylori infection    History of kidney stones    History of TIA (transient ischemic attack) 2008  and 10-19-2018    no residual's   History of traumatic head injury 2010  w/ LOC  per pt needed stitches, hit in head with a mower blade   Hyperlipidemia    Hypertension    Hypogonadism male    s/p  bilateral orchiectomy   Hypothyroidism    Insomnia    Mild obstructive sleep apnea    study in epic 12-04-2016, no cpap   PTSD (post-traumatic stress disorder)    chronic   PTSD (post-traumatic stress disorder)    RA (rheumatoid arthritis) (HCC)    followed by [facility]   Seizures, transient [facility]) neurologist-  dr Terrace Arabia--   differential dx complex partial seizure .vs.  mood disorder .vs.  pseudoseizure--  negative EEG's   confusion episodes and staring spells since 11/ 2015   (03-26-2020 per pt wife last seizure 10 /2021)   Suicide attempt by drug overdose (HCC) 07/28/2021   Transient confusion NEUOROLOGIST-  DR Terrace Arabia   Episodes since 11/ 2015--  neurologist dx  differential complex partial seizure  .vs. mood disorder . vs. pseudoseizure   Type 2 diabetes mellitus treated with insulin [facility])    endocrinologist--- dr Everardo All---  (03-26-2020 pt does not check blood sugar at home)    Significant Hospital Events: Including procedures, antibiotic start and stop dates in addition to other pertinent events   2/17 - Presented to [facility] via EMS post-OOH cardiac arrest. Intubated. Labs as above. CT Head NAICA, C-spine negative, CTA PE protocol negative for PE but +hazy interstitial and GGOs c/f PNA, CT Abd with stool ball/thick walled bladder with catheter in place. Vanc/Zosyn started. Epi gtt weaning over to Levo. PCCM consulted for ICU admission.  Interim History / Subjective:   Eyes open; follows intermittent commands; right pupil 6mm sluggish and left 4mm reactive On PSV doing well Off pressors  Objective:  Blood pressure (!) 120/57, pulse 81, temperature (!) 97 F (36.1 C), resp. rate (!) 26, height 6' (1.829 m), weight 81 kg, SpO2 100%.    Vent Mode: PRVC FiO2 (%):  [60 %-100 %] 60 % Set Rate:  [18 bmp-30 bmp] 26 bmp Vt Set:  [620 mL] 620 mL PEEP:  [5 cmH20] 5 cmH20 Plateau Pressure:  [24 cmH20-28 cmH20] 28 cmH20   Intake/Output Summary (Last 24 hours) at 05/01/2023 1610 Last data filed at 05/01/2023 0700 Gross per 24 hour  Intake 2512.06 ml  Output 685 ml  Net 1827.06 ml   Filed Weights   04/30/23 2106  Weight: 81 kg   Physical Examination: General:  critically ill appearing on mech vent HEENT: MM pink/moist; ETT in place Neuro: lightly sedated; Eyes open; follows intermittent commands; right pupil 6mm  sluggish and left 4mm reactive CV: s1s2, RRR, no m/r/g PULM:  dim clear BS bilaterally; on mech vent PSV GI: soft, bsx4 active  Extremities: warm/dry, no edema; b/l heel wounds poa  Resolved Hospital Problem List:    Assessment & Plan:  Post-cardiac arrest, OOH Plan: -ICU w/ continuous telemetry monitoring -off pressors; map goal >65 -trop 13 -replete K and mag -echo  Undifferentiated shock, presume primarily septic, query cardiogenic component s/p arrest Urosepsis; suprapubic catheter in place for chronic urinary retention Multifocal PNA History of ESBL infection and MRSA. Cx data from 01/2023 ("abscess") MDR Klebsiella pneumoniae, E. Coli and E. Faecalis. Urine Cx data may be difficult to interpret (pending organism growth) due to likely colonization and nonsterile catheter. Plan: -off pressors; map goal >65 -cont zosyn and vanc; mrsa pcr pending; add doxy for atypical coverage -follow cultures; send urine legionella/strep -completed course of tamiflu outpt; flu a  positive here; likely no benefit in repeating course -hold stress dose steroids  Acute encephalopathy: post arrest; hypercarbic resp failure Anisocoria: right pupil > left -ct head no acute abnormality -uds pos for benzos; ethanol pending Plan: -some improvement in mentation today -limit sedating meds -eeg -consider MRI -ethanol level pending -treat for sepsis  Acute hypoxemic and hypercarbic respiratory failure post-cardiac arrest Multifocal pneumonia Influenza A infection History of COPD History of mild OSA Plan: -will attempt SBT; still weak cough and thick secretions; if patient more awake and having stronger cough can consider extubation -rest on LTVV strategy with tidal volumes of 6-8 cc/kg ideal body weight -Wean PEEP/FiO2 for SpO2 >92% -VAP bundle in place -Daily SAT and SBT -PAD protocol in place -wean sedation for RASS goal 0 to -1 -pulm toiletry -abx as above -duoneb prn  Paroxysmal  Afib: new onset but now regular HTN HLD CAD Plan: -tele monitoring -hold AC for now -echo -hold anti-htn meds for now given just weaned off pressors -resume home plavix and statin  T2DM with hyperglycemia, likely in the setting of sepsis Gastroparesis CBGs 400s and serum glucoses > 500 on arrival. Plan: -wean off insulin drip to ssi and cbg monitoring  GERD History of PUD/H. Pylori - PPI  AKI, mild, likely ATN in the setting of sepsis/cardiac arrest Hypokalemia Hyperphosphatemia History of bladder CA Indwelling suprapubic catheter Plan: -cont suprapubic catheter -Trend BMP / urinary output -Replace electrolytes as indicated -Avoid nephrotoxic agents, ensure adequate renal perfusion  Pressure wounds of bilateral heels, POA Plan: - WOC consult  RA DDD Chronic back pain Plan: -hold plaquenil and lyrica for now  Bipolar disorder Anxiety PTSD Transient seizures vs. pseudoseizure Essential tremor Plan: - hold  home Latuda, Effexor, Requip for now  Hx of CVA/prior TIA Plan: -plavix and statin  Best Practice: (right click and "Reselect all SmartList Selections" daily)   Diet/type: NPO DVT prophylaxis: SCDs, SQH versus heparin gtt pending trop trend/level of concern for ACS GI prophylaxis: PPI Lines: Central line and Arterial Line Foley:  N/A - indwelling suprapubic catheter noted on admission Code Status:  full code Last date of multidisciplinary goals of care discussion [2/18 - Sister, Renee updated at bedside]  Labs:  CBC: Recent Labs  Lab 04/30/23 1814 04/30/23 1827 04/30/23 1955 04/30/23 2010 05/01/23 0349 05/01/23 0500  WBC SPECIMEN CONTAMINATED, UNABLE TO PERFORM TEST(S).  --   --  37.0*  --  22.2*  HGB SPECIMEN CONTAMINATED, UNABLE TO PERFORM TEST(S). 9.2* 8.2* 8.5* 8.2* 8.2*  HCT SPECIMEN CONTAMINATED, UNABLE TO PERFORM TEST(S). 27.0* 24.0* 26.8* 24.0* 25.0*  MCV SPECIMEN CONTAMINATED, UNABLE TO PERFORM TEST(S).  --   --  83.2  --  80.1   PLT SPECIMEN CONTAMINATED, UNABLE TO PERFORM TEST(S).  --   --  444*  --  340   Basic Metabolic Panel: Recent Labs  Lab 04/30/23 1827 04/30/23 1955 04/30/23 2010 05/01/23 0349 05/01/23 0500  NA 139 141 139 141 139  K 3.9 3.2* 3.3* 4.3 4.5  CL 108  --  110  --  112*  CO2  --   --  17*  --  18*  GLUCOSE 523*  --  509*  --  178*  BUN 16  --  15  --  19  CREATININE 1.00  --  1.25*  --  1.27*  CALCIUM  --   --  8.0*  --  7.6*  MG  --   --  1.7  --  1.4*  PHOS  --   --  6.4*  --  3.7   GFR: Estimated Creatinine Clearance: 72.1 mL/min (A) (by C-G formula based on SCr of 1.27 mg/dL (H)). Recent Labs  Lab 04/30/23 1814 04/30/23 1832 04/30/23 2010 05/01/23 0500  PROCALCITON  --   --  0.42  --   WBC SPECIMEN CONTAMINATED, UNABLE TO PERFORM TEST(S).  --  37.0* 22.2*  LATICACIDVEN  --  4.1*  --   --    Liver Function Tests: Recent Labs  Lab 04/30/23 2010 05/01/23 0500  AST 20 22  ALT 18 17  ALKPHOS 136* 111  BILITOT 0.5 0.4  PROT 5.5* 5.0*  ALBUMIN 1.7* 1.6*   No results for input(s): "LIPASE", "AMYLASE" in the last 168 hours. No results for input(s): "AMMONIA" in the last 168 hours.  ABG:    Component Value Date/Time   PHART 7.379 05/01/2023 0349   PCO2ART 31.1 (L) 05/01/2023 0349   PO2ART 99 05/01/2023 0349   HCO3 18.3 (L) 05/01/2023 0349   TCO2 19 (L) 05/01/2023 0349   ACIDBASEDEF 6.0 (H) 05/01/2023 0349   O2SAT 98 05/01/2023 0349    Coagulation Profile: Recent Labs  Lab 04/30/23 1814 04/30/23 2010  INR SPECIMEN CONTAMINATED, UNABLE TO PERFORM TEST(S). 1.3*   Cardiac Enzymes: No results for input(s): "CKTOTAL", "CKMB", "CKMBINDEX", "TROPONINI" in the last 168 hours.  HbA1C: Hgb A1c MFr Bld  Date/Time Value Ref Range Status  09/25/2022 12:02 AM 12.4 (H) 4.8 - 5.6 % Final    Comment:    (NOTE) Pre diabetes:          5.7%-6.4%  Diabetes:              >6.4%  Glycemic control for   <7.0% adults with diabetes   08/26/2022 08:14 AM 14.1 (H) 4.8 - 5.6  % Final    Comment:    (NOTE) Pre diabetes:          5.7%-6.4%  Diabetes:              >6.4%  Glycemic control for   <7.0% adults with diabetes    CBG: Recent Labs  Lab 05/01/23 0130 05/01/23 0228 05/01/23 0331 05/01/23 0433 05/01/23 0540  GLUCAP 395* 318* 261* 191* 183*    Review of Systems:   Patient is encephalopathic and/or intubated; therefore, history has been obtained from chart review and from sister, at bedside.   Past Medical History:  He,  has a past medical history of Anxiety, Benign essential tremor, Bipolar 2 disorder (HCC), Bladder cancer (HCC), CAD (coronary artery disease), Chronic pain syndrome, Cold extremities, COPD (chronic obstructive pulmonary disease) (HCC), DDD (degenerative disc disease), lumbar, Diabetic peripheral neuropathy (HCC), Gastroparesis, GERD (gastroesophageal reflux disease), Hiatal hernia, History of bladder cancer (urologist-  previously dr Ronal Fear;  now dr gay), History of chest pain (12/2017), History of encephalopathy (05/27/2015), History of gastric ulcer, History of Helicobacter pylori infection, History of kidney stones, History of TIA (transient ischemic attack) (2008  and 10-19-2018), History of traumatic head injury (2010), Hyperlipidemia, Hypertension, Hypogonadism male, Hypothyroidism, Insomnia, Mild obstructive sleep apnea, PTSD (post-traumatic stress disorder), PTSD (post-traumatic stress disorder), RA (rheumatoid arthritis) (HCC), Seizures, transient (HCC) (neurologist-  dr Terrace Arabia--  differential dx complex partial seizure .vs.  mood disorder .vs.  pseudoseizure--  negative EEG's), Suicide attempt by drug overdose (HCC) (07/28/2021), Transient confusion (NEUOROLOGIST-  DR YAN), and Type 2 diabetes mellitus treated with insulin (HCC).   Surgical History:   Past Surgical History:  Procedure Laterality Date   AMPUTATION Left 04/28/2020   Procedure:  LEFT LITTLE FINGER AMPUTATION;  Surgeon: Nadara Mustard, MD;  Location: [facility] OR;  Service:  Orthopedics;  Laterality: Left;   CARDIAC CATHETERIZATION  12-27-2001  DR Jacinto Halim  &  05-26-2009  DR Eldridge Dace   RESULTS FOR BOTH ARE NORMAL CORONARIES AND PERSERVED LVF/ EF 60%   CARPAL TUNNEL RELEASE Bilateral right 09-16-2003;  left ?   CARPAL TUNNEL RELEASE Left 02/25/2015   Procedure: LEFT CARPAL TUNNEL RELEASE;  Surgeon: Betha Loa, MD;  Location: [facility];  Service: Orthopedics;  Laterality: Left;   CYSTOSCOPY N/A 10/10/2012   Procedure: CYSTOSCOPY CLOT EVACUATION FULGERATION OF BLEEDERS ;  Surgeon: Garnett Farm, MD;  Location: [facility];  Service: Urology;  Laterality: N/A;   CYSTOSCOPY WITH BIOPSY N/A 11/26/2015   Procedure: CYSTOSCOPY WITH BIOPSY AND FULGURATION;  Surgeon: Ihor Gully, MD;  Location: [facility];  Service: Urology;  Laterality: N/A;   ESOPHAGOGASTRODUODENOSCOPY  2014   IR CATHETER TUBE CHANGE  02/06/2023   IR SINUS/FIST TUBE CHK-NON GI  02/06/2023   IR US GUIDE BX ASP/DRAIN  01/19/2023   IRRIGATION AND DEBRIDEMENT ABSCESS Right 09/27/2022   Procedure: IRRIGATION AND DEBRIDEMENT OF RIGHT BUTTOCK  ABSCESS;  Surgeon: Griselda Miner, MD;  Location: [facility] OR;  Service: General;  Laterality: Right;   LAPAROSCOPIC CHOLECYSTECTOMY  11-17-2010   ORCHIECTOMY Right 02/21/2016   Procedure: SCROTAL ORCHIECTOMY with TESTICULAR PROSTHESIS IMPLANT;  Surgeon: Ihor Gully, MD;  Location: [facility];  Service: Urology;  Laterality: Right;   ORCHIECTOMY Left 09/02/2018   Procedure: ORCHIECTOMY;  Surgeon: Ihor Gully, MD;  Location: [facility];  Service: Urology;  Laterality: Left;   ROTATOR CUFF REPAIR Right 12/2004   TRANSURETHRAL RESECTION OF BLADDER TUMOR N/A 08/09/2012   Procedure: TRANSURETHRAL RESECTION OF BLADDER TUMOR (TURBT) WITH GYRUS WITH MITOMYCIN C;  Surgeon: Garnett Farm, MD;  Location: [facility];  Service: Urology;  Laterality: N/A;   TRANSURETHRAL RESECTION OF BLADDER TUMOR  N/A 03/29/2020   Procedure: TRANSURETHRAL RESECTION OF BLADDER TUMOR (TURBT) and post-op instillation of gemcitabine;  Surgeon: [name], MD;  Location: [facility];  Service: Urology;  Laterality: N/A;   TRANSURETHRAL RESECTION OF BLADDER TUMOR WITH GYRUS (TURBT-GYRUS) N/A 02/27/2014   Procedure: TRANSURETHRAL RESECTION OF BLADDER TUMOR WITH GYRUS (TURBT-GYRUS);  Surgeon: Garnett Farm, MD;  Location: [facility];  Service: Urology;  Laterality: N/A;    Social History:   reports that he has been smoking cigarettes. He has a 57 pack-year smoking history. He has never used smokeless tobacco. He reports current alcohol use. He reports that he does not use drugs.   Family History:  His family history includes Alcohol abuse in his father; Diabetes in his father and mother; Heart attack (age of onset: 80) in his father; Hypertension in his father. There is no history of Colon cancer, Esophageal cancer, Stomach cancer, or Rectal cancer.   Allergies: Allergies  Allergen Reactions   Celecoxib Anaphylaxis, Swelling, Rash and Other (See Comments)    Tongue swells   Sucralfate Anaphylaxis, Rash and Other (See Comments)    Broke out in a rash with hives that left scars on his legs   Hydrocodone Rash and Other (See Comments)    "Blisters developed on arms"; tolerated Norco okay (per sister)    Sulfa Antibiotics Rash    Home Medications: Prior to Admission medications   Medication Sig Start Date End Date Taking? Authorizing Provider  acetaminophen (TYLENOL)  500 MG tablet Take 1,000 mg by mouth every 6 (six) hours as needed for mild pain (pain score 1-3) or moderate pain (pain score 4-6).    [provider]  amLODipine (NORVASC) 10 MG tablet Take 10 mg by mouth daily. 03/15/23   [provider]  atorvastatin (LIPITOR) 20 MG tablet Take 20 mg by mouth daily. 04/23/23   [provider]  fluticasone (FLONASE) 50 MCG/ACT nasal spray Place 1 spray  into both nostrils daily. 04/23/23   [provider]  hydroxychloroquine (PLAQUENIL) 200 MG tablet Take 200 mg by mouth 2 (two) times daily. 03/15/23   [provider]  hydrOXYzine (ATARAX) 25 MG tablet Take 25 mg by mouth at bedtime. 08/01/22   [provider]  insulin glargine, 2 Unit Dial, (TOUJEO MAX SOLOSTAR) 300 UNIT/ML Solostar Pen Inject 25 Units into the skin 2 (two) times daily at 8 am and 10 pm. Patient taking differently: Inject 35 Units into the skin daily. 03/25/23   Azucena Fallen, MD  insulin lispro (HUMALOG) 100 UNIT/ML injection Inject 35 Units into the skin in the morning and at bedtime.    [provider]  lurasidone (LATUDA) 40 MG TABS tablet Take 40 mg by mouth daily with breakfast. 03/15/23   [provider]  omeprazole (PRILOSEC) 40 MG capsule Take 40 mg by mouth daily before breakfast. 08/23/21   [provider]  oseltamivir (TAMIFLU) 75 MG capsule Take 75 mg by mouth 2 (two) times daily. 04/23/23   [provider]  pregabalin (LYRICA) 200 MG capsule Take 1 capsule (200 mg total) by mouth 3 (three) times daily. 01/23/23   Rhetta Mura, MD  Prenatal 27-1 MG TABS Take 1 tablet by mouth daily. Patient not taking: Reported on 03/29/2023 03/23/23   [provider]  rOPINIRole (REQUIP) 0.5 MG tablet Take 0.5 mg by mouth at bedtime. 03/20/23   [provider]  rosuvastatin (CRESTOR) 10 MG tablet Take 10 mg by mouth at bedtime. 03/15/23   [provider]  sodium chloride flush (NS) 0.9 % SOLN 5 mLs by Intracatheter route every 8 (eight) hours. Patient not taking: Reported on 03/25/2023 01/25/23   Almon Hercules, MD  traZODone (DESYREL) 50 MG tablet Take 50 mg by mouth at bedtime. 02/10/23   [provider]  venlafaxine XR (EFFEXOR-XR) 150 MG 24 hr capsule Take 1 capsule (150 mg total) by mouth daily with breakfast. 01/23/23   Rhetta Mura, MD  Vitamin D, Ergocalciferol, (DRISDOL)  1.25 MG (50000 UNIT) CAPS capsule Take 50,000 Units by mouth once a week. 03/24/23   [provider]   Critical care time:   The patient is critically ill with multiple organ system failure and requires high complexity decision making for assessment and support, frequent evaluation and titration of therapies, advanced monitoring, review of radiographic studies and interpretation of complex data.   Critical Care Time devoted to patient care services, exclusive of separately billable procedures, described in this note is 43 minutes.  Lidia Collum, PA-C [facility] 05/01/23 7:14 AM  Please see [email or web address] for pager details.  From 7A-7P if no response, please call [phone] After hours, please call ELink [phone]

## 2023-05-01 NOTE — Inpatient Diabetes Management (Signed)
Inpatient Diabetes Program Recommendations  AACE/ADA: New Consensus Statement on Inpatient Glycemic Control (2015)  Target Ranges:  Prepandial:   less than 140 mg/dL      Peak postprandial:   less than 180 mg/dL (1-2 hours)      Critically ill patients:  140 - 180 mg/dL   Lab Results  Component Value Date   GLUCAP 205 (H) 05/01/2023   HGBA1C 12.4 (H) 09/25/2022    Review of Glycemic Control  Latest Reference Range & Units 05/01/23 06:36 05/01/23 07:47 05/01/23 09:44  Glucose-Capillary 70 - 99 mg/dL 161 (H) 096 (H) 045 (H)   Diabetes history: DM 2 Outpatient Diabetes medications:  Toujeo 35 units daily Humalog 35 units bid Current orders for Inpatient glycemic control:  Novolog 2-6 units tid with meals Semglee 5 units bid Inpatient Diabetes Program Recommendations:    Note patient transitioning off insulin drip. Patient was on basal/bolus insulin prior to admit. Will follow.  Thanks,   Lorenza Cambridge, RN, BC-ADM Inpatient Diabetes Coordinator Pager [phone]  (8a-5p)

## 2023-05-01 NOTE — Progress Notes (Signed)
eLink Physician-Brief Progress Note Patient Name: Vincent [name] DOB: 12-03-1965 MRN: [id number]   Date of Service  05/01/2023  HPI/Events of Note  Troponin down to 360 from 520, echocardiogram without regional wall motion abnormalities. Troponin 3 result pending.  eICU Interventions          Migdalia Dk 05/01/2023, 7:39 PM

## 2023-05-01 NOTE — Progress Notes (Signed)
Initial Nutrition Assessment  DOCUMENTATION CODES:   Non-severe (moderate) malnutrition in context of chronic illness  INTERVENTION:  Initiate tube feeding once access is obtained: Vital 1.5 at 20 ml/h and advance by 10 ml every 8 hours until goal of 65 ml/hr (1560 ml per day) Prosource TF20 60 ml daily  Provides 2420 kcal, 125 gm protein, 1192 ml free water daily  When access obtained: 500 mg Vitamin C and 220 mg Zinc per day for wound healing 100 mg Thiamine x 7 days  Monitor magnesium, potassium, and phosphorus daily for at least 3 days, MD to replete as needed, as pt is at risk for refeeding syndrome.   NUTRITION DIAGNOSIS:   Moderate Malnutrition related to chronic illness as evidenced by moderate fat depletion, moderate muscle depletion.   GOAL:   Patient will meet greater than or equal to 90% of their needs   MONITOR:   Diet advancement, I & O's, Labs, Weight trends, Vent status, TF tolerance  REASON FOR ASSESSMENT:   Ventilator    ASSESSMENT:  58 y.o male admitted after cardiac arrest. PMH of HTN, HLD, CAD, TIA, COPD, T2DM, gastroparesis, diabetic peripheral neuropathy, hypothyroidism, GERD, bladder CA, hypogonadism, DDD, RA, seizures, bipolar disorder, anxiety, PTSD.  Patient intubated, able to open eyes and intermittently follow commands. Had 2 sisters at bedside who were able to provide history. Patient lives with one of his sisters who is his caretaker.Is wheelchair bound but can walk with assistance. Has no feeling in his legs due to neuropathy.   They report patient was at a rehab SNF for 14-15 days but had poor PO intake due to the quality of the food. Only has a few teeth.They would bring in snacks and meals for him but another factor of his poor PO intake was patients fear of bowel incontinence.  She reports patient has not been able to hold his bowels for a while. He is usually constipated but within the last couple of days he has been having diarrhea, they  suspect it is due to the flu. They state he did not want to eat because he did not want to have a bowel movement.   They have noticed him losing weight and states he now weighs 160 lbs with his UBW being 185 lbs.  Reviewed weight history and question if current weight is accurate as he was 156 lbs last month. Patient has chronic wounds, will supplement with vitamin C and Zn for now, if he tolerates tube feeds can add juven.   Patient qualifies for moderate malnutrition based on moderate muscle and fat wasting. Has severe muscle wasting on his legs but unable to consider due to baseline immobility.   RN tried to place OGT multiple times with no success. Cortrak planned for tomorrow but if unable to place, will have to have radiology place Cortrak. Levo @ 3.75 ml/hr off insulin drip. Make sure when TF started he has insulin coverage as BG poorly controlled.     Patient is currently intubated on ventilator support MV: 9.4 L/min Temp (24hrs), Avg:93.2 F (34 C), Min:86.1 F (30.1 C), Max:97.3 F (36.3 C)  Admit weight: 81 kg ? Question accuracy  Current weight: 81 kg   Intake/Output Summary (Last 24 hours) at 05/01/2023 1549 Last data filed at 05/01/2023 1400 Gross per 24 hour  Intake 3206.6 ml  Output 685 ml  Net 2521.6 ml   Net IO Since Admission: 2,521.6 mL [05/01/23 1549]  Nutritionally Relevant Medications: Scheduled Meds:  docusate  100  mg Per Tube BID   Continuous Infusions:  sodium chloride     doxycycline (VIBRAMYCIN) IV Stopped (05/01/23 1136)   fentaNYL infusion INTRAVENOUS 75 mcg/hr (05/01/23 1400)   norepinephrine (LEVOPHED) Adult infusion 1 mcg/min (05/01/23 1400)   piperacillin-tazobactam (ZOSYN)  IV 12.5 mL/hr at 05/01/23 1400   propofol (DIPRIVAN) infusion Stopped (05/01/23 5366)   vancomycin Stopped (05/01/23 1030)    Labs Reviewed: Chloride 112, Creatinine 1.27, Calcium 7.6, Magnesium 1.4,  CBG ranges from 91-499 mg/dL over the last 24 hours HgbA1c 12.4  (09/2022)  NUTRITION - FOCUSED PHYSICAL EXAM:  Flowsheet Row Most Recent Value  Orbital Region Moderate depletion  Upper Arm Region Moderate depletion  Thoracic and Lumbar Region Moderate depletion  Buccal Region Moderate depletion  Temple Region Moderate depletion  Clavicle Bone Region Moderate depletion  Clavicle and Acromion Bone Region Moderate depletion  Scapular Bone Region Unable to assess  Dorsal Hand Mild depletion  Patellar Region Severe depletion  Anterior Thigh Region Severe depletion  Posterior Calf Region Severe depletion  Edema (RD Assessment) None  Hair Reviewed  Eyes Reviewed  [Pupil different sizes]  Mouth Reviewed  [No teeth]  Skin Reviewed  Nails Reviewed       Diet Order:   Diet Order             Diet NPO time specified  Diet effective now                   EDUCATION NEEDS:   Education needs have been addressed  Skin:  Skin Assessment: Skin Integrity Issues: Skin Integrity Issues:: Unstageable, DTI DTI: L heel Unstageable: R heel  Last BM:  PTA  Height:   Ht Readings from Last 1 Encounters:  04/30/23 6' (1.829 m)    Weight:   Wt Readings from Last 1 Encounters:  05/01/23 81 kg    Ideal Body Weight:  80.9 kg  BMI:  Body mass index is 24.22 kg/m.  Estimated Nutritional Needs:   Kcal:  2300-2500 kcal  Protein:  120-140 gm  Fluid:  >2L/day   Elliot Dally, RD Registered Dietitian  See Amion for more information

## 2023-05-01 NOTE — Progress Notes (Signed)
Hypoglycemic Event  CBG: 64  Treatment: D50 25 mL (12.5 gm)  Symptoms: None  Follow-up CBG: Time:1213 CBG Result:113  Possible Reasons for Event: Inadequate meal intake and Medication regimen:    Comments/MD notified: Dr. Chestine Spore MD, Patsy Lager, PA aware     Tenna Child

## 2023-05-02 ENCOUNTER — Inpatient Hospital Stay (HOSPITAL_COMMUNITY): Payer: 59

## 2023-05-02 DIAGNOSIS — J9602 Acute respiratory failure with hypercapnia: Secondary | ICD-10-CM | POA: Diagnosis not present

## 2023-05-02 DIAGNOSIS — J9601 Acute respiratory failure with hypoxia: Secondary | ICD-10-CM | POA: Diagnosis not present

## 2023-05-02 DIAGNOSIS — I469 Cardiac arrest, cause unspecified: Secondary | ICD-10-CM | POA: Diagnosis not present

## 2023-05-02 DIAGNOSIS — A419 Sepsis, unspecified organism: Secondary | ICD-10-CM | POA: Diagnosis not present

## 2023-05-02 LAB — URINE CULTURE: Culture: 40000 — AB

## 2023-05-02 LAB — CBC
HCT: 22.2 % — ABNORMAL LOW (ref 39.0–52.0)
Hemoglobin: 7.2 g/dL — ABNORMAL LOW (ref 13.0–17.0)
MCH: 26.2 pg (ref 26.0–34.0)
MCHC: 32.4 g/dL (ref 30.0–36.0)
MCV: 80.7 fL (ref 80.0–100.0)
Platelets: 235 10*3/uL (ref 150–400)
RBC: 2.75 MIL/uL — ABNORMAL LOW (ref 4.22–5.81)
RDW: 14.4 % (ref 11.5–15.5)
WBC: 12.9 10*3/uL — ABNORMAL HIGH (ref 4.0–10.5)
nRBC: 0 % (ref 0.0–0.2)

## 2023-05-02 LAB — POCT I-STAT 7, (LYTES, BLD GAS, ICA,H+H)
Acid-base deficit: 7 mmol/L — ABNORMAL HIGH (ref 0.0–2.0)
Bicarbonate: 18.3 mmol/L — ABNORMAL LOW (ref 20.0–28.0)
Calcium, Ion: 1.17 mmol/L (ref 1.15–1.40)
HCT: 19 % — ABNORMAL LOW (ref 39.0–52.0)
Hemoglobin: 6.5 g/dL — CL (ref 13.0–17.0)
O2 Saturation: 100 %
Patient temperature: 98.6
Potassium: 3.7 mmol/L (ref 3.5–5.1)
Sodium: 140 mmol/L (ref 135–145)
TCO2: 19 mmol/L — ABNORMAL LOW (ref 22–32)
pCO2 arterial: 32.9 mm[Hg] (ref 32–48)
pH, Arterial: 7.352 (ref 7.35–7.45)
pO2, Arterial: 311 mm[Hg] — ABNORMAL HIGH (ref 83–108)

## 2023-05-02 LAB — GLUCOSE, CAPILLARY
Glucose-Capillary: 118 mg/dL — ABNORMAL HIGH (ref 70–99)
Glucose-Capillary: 153 mg/dL — ABNORMAL HIGH (ref 70–99)
Glucose-Capillary: 68 mg/dL — ABNORMAL LOW (ref 70–99)
Glucose-Capillary: 80 mg/dL (ref 70–99)
Glucose-Capillary: 82 mg/dL (ref 70–99)
Glucose-Capillary: 82 mg/dL (ref 70–99)

## 2023-05-02 LAB — BASIC METABOLIC PANEL
Anion gap: 13 (ref 5–15)
BUN: 26 mg/dL — ABNORMAL HIGH (ref 6–20)
CO2: 18 mmol/L — ABNORMAL LOW (ref 22–32)
Calcium: 7.9 mg/dL — ABNORMAL LOW (ref 8.9–10.3)
Chloride: 107 mmol/L (ref 98–111)
Creatinine, Ser: 2 mg/dL — ABNORMAL HIGH (ref 0.61–1.24)
GFR, Estimated: 39 mL/min — ABNORMAL LOW (ref >60)
Glucose, Bld: 84 mg/dL (ref 70–99)
Potassium: 4 mmol/L (ref 3.5–5.1)
Sodium: 138 mmol/L (ref 135–145)

## 2023-05-02 LAB — VANCOMYCIN, RANDOM: Vancomycin Rm: 36 ug/mL

## 2023-05-02 LAB — MAGNESIUM: Magnesium: 2.2 mg/dL (ref 1.7–2.4)

## 2023-05-02 LAB — HEMOGLOBIN AND HEMATOCRIT, BLOOD
HCT: 22 % — ABNORMAL LOW (ref 39.0–52.0)
Hemoglobin: 7 g/dL — ABNORMAL LOW (ref 13.0–17.0)

## 2023-05-02 MED ORDER — FENTANYL BOLUS VIA INFUSION
50.0000 ug | INTRAVENOUS | Status: DC | PRN
  Administered 2023-05-03 – 2023-05-06 (×2): 50 ug via INTRAVENOUS

## 2023-05-02 MED ORDER — ROPINIROLE HCL 0.5 MG PO TABS
0.5000 mg | ORAL_TABLET | Freq: Every day | ORAL | Status: DC
  Administered 2023-05-02 – 2023-05-06 (×5): 0.5 mg
  Filled 2023-05-02 (×5): qty 1

## 2023-05-02 MED ORDER — MIDAZOLAM HCL 2 MG/2ML IJ SOLN: 2.0000 mg | Freq: Once | INTRAMUSCULAR | Status: AC

## 2023-05-02 MED ORDER — ACETAMINOPHEN 325 MG PO TABS
650.0000 mg | ORAL_TABLET | Freq: Three times a day (TID) | ORAL | Status: AC
  Administered 2023-05-02 – 2023-05-05 (×8): 650 mg
  Filled 2023-05-02 (×8): qty 2

## 2023-05-02 MED ORDER — OSELTAMIVIR PHOSPHATE 30 MG PO CAPS
30.0000 mg | ORAL_CAPSULE | Freq: Two times a day (BID) | ORAL | Status: DC
  Administered 2023-05-02 – 2023-05-03 (×3): 30 mg
  Filled 2023-05-02 (×4): qty 1

## 2023-05-02 MED ORDER — VITAMIN C 500 MG PO TABS
500.0000 mg | ORAL_TABLET | Freq: Every day | ORAL | Status: DC
  Administered 2023-05-02 – 2023-05-06 (×5): 500 mg
  Filled 2023-05-02 (×5): qty 1

## 2023-05-02 MED ORDER — ZINC SULFATE 220 (50 ZN) MG PO CAPS
220.0000 mg | ORAL_CAPSULE | Freq: Every day | ORAL | Status: DC
  Administered 2023-05-02 – 2023-05-06 (×5): 220 mg
  Filled 2023-05-02 (×5): qty 1

## 2023-05-02 MED ORDER — ETOMIDATE 2 MG/ML IV SOLN
INTRAVENOUS | Status: AC
  Administered 2023-05-02: 20 mg via INTRAVENOUS
  Filled 2023-05-02: qty 20

## 2023-05-02 MED ORDER — ROCURONIUM BROMIDE 10 MG/ML (PF) SYRINGE: 100.0000 mg | PREFILLED_SYRINGE | Freq: Once | INTRAVENOUS | Status: AC

## 2023-05-02 MED ORDER — MIDAZOLAM HCL 2 MG/2ML IJ SOLN
INTRAMUSCULAR | Status: AC
  Administered 2023-05-02: 2 mg via INTRAVENOUS
  Filled 2023-05-02: qty 2

## 2023-05-02 MED ORDER — FENTANYL CITRATE PF 50 MCG/ML IJ SOSY
50.0000 ug | PREFILLED_SYRINGE | Freq: Once | INTRAMUSCULAR | Status: AC
  Administered 2023-05-02: 50 ug via INTRAVENOUS
  Filled 2023-05-02: qty 1

## 2023-05-02 MED ORDER — VITAL 1.5 CAL PO LIQD
1000.0000 mL | ORAL | Status: DC
  Administered 2023-05-02 – 2023-05-06 (×4): 1000 mL
  Filled 2023-05-02: qty 1000

## 2023-05-02 MED ORDER — VENLAFAXINE HCL ER 150 MG PO CP24
150.0000 mg | ORAL_CAPSULE | Freq: Every day | ORAL | Status: DC
  Filled 2023-05-02: qty 1

## 2023-05-02 MED ORDER — DOCUSATE SODIUM 50 MG/5ML PO LIQD
100.0000 mg | Freq: Two times a day (BID) | ORAL | Status: DC
  Administered 2023-05-02 – 2023-05-04 (×2): 100 mg
  Filled 2023-05-02 (×4): qty 10

## 2023-05-02 MED ORDER — VANCOMYCIN VARIABLE DOSE PER UNSTABLE RENAL FUNCTION (PHARMACIST DOSING): Status: DC

## 2023-05-02 MED ORDER — SODIUM CHLORIDE 3 % IN NEBU
4.0000 mL | INHALATION_SOLUTION | RESPIRATORY_TRACT | Status: AC
Start: 2023-05-02 — End: 2023-05-04
  Administered 2023-05-02 – 2023-05-04 (×9): 4 mL via RESPIRATORY_TRACT
  Filled 2023-05-02 (×10): qty 4

## 2023-05-02 MED ORDER — POLYETHYLENE GLYCOL 3350 17 G PO PACK
17.0000 g | PACK | Freq: Every day | ORAL | Status: DC
  Administered 2023-05-02 – 2023-05-04 (×3): 17 g
  Filled 2023-05-02 (×4): qty 1

## 2023-05-02 MED ORDER — THIAMINE MONONITRATE 100 MG PO TABS
100.0000 mg | ORAL_TABLET | Freq: Every day | ORAL | Status: DC
  Administered 2023-05-02 – 2023-05-06 (×5): 100 mg
  Filled 2023-05-02 (×5): qty 1

## 2023-05-02 MED ORDER — ETOMIDATE 2 MG/ML IV SOLN
20.0000 mg | Freq: Once | INTRAVENOUS | Status: AC
Start: 2023-05-02 — End: 2023-05-02

## 2023-05-02 MED ORDER — ROPINIROLE HCL 0.5 MG PO TABS
0.5000 mg | ORAL_TABLET | Freq: Every day | ORAL | Status: DC
  Filled 2023-05-02: qty 1

## 2023-05-02 MED ORDER — INSULIN ASPART 100 UNIT/ML IJ SOLN
2.0000 [IU] | INTRAMUSCULAR | Status: DC
  Administered 2023-05-02 – 2023-05-03 (×3): 4 [IU] via SUBCUTANEOUS
  Administered 2023-05-03: 6 [IU] via SUBCUTANEOUS
  Administered 2023-05-03 (×2): 4 [IU] via SUBCUTANEOUS
  Administered 2023-05-03 – 2023-05-04 (×2): 6 [IU] via SUBCUTANEOUS
  Administered 2023-05-04 (×2): 4 [IU] via SUBCUTANEOUS
  Administered 2023-05-04 (×2): 6 [IU] via SUBCUTANEOUS

## 2023-05-02 MED ORDER — OXYCODONE HCL 5 MG PO TABS
5.0000 mg | ORAL_TABLET | ORAL | Status: DC | PRN
  Administered 2023-05-02: 5 mg via ORAL
  Filled 2023-05-02: qty 1

## 2023-05-02 MED ORDER — OSELTAMIVIR PHOSPHATE 75 MG PO CAPS
75.0000 mg | ORAL_CAPSULE | Freq: Once | ORAL | Status: AC
  Administered 2023-05-02: 75 mg
  Filled 2023-05-02: qty 1

## 2023-05-02 MED ORDER — FREE WATER
200.0000 mL | Status: DC
  Administered 2023-05-02 – 2023-05-06 (×22): 200 mL

## 2023-05-02 MED ORDER — LIDOCAINE 5 % EX PTCH
1.0000 | MEDICATED_PATCH | CUTANEOUS | Status: DC
  Administered 2023-05-02 – 2023-05-06 (×5): 1 via TRANSDERMAL
  Filled 2023-05-02 (×5): qty 1

## 2023-05-02 MED ORDER — MIDAZOLAM HCL 2 MG/2ML IJ SOLN: 1.0000 mg | INTRAMUSCULAR | Status: DC | PRN

## 2023-05-02 MED ORDER — FUROSEMIDE 10 MG/ML IJ SOLN
40.0000 mg | Freq: Once | INTRAMUSCULAR | Status: AC
Start: 2023-05-02 — End: 2023-05-02
  Administered 2023-05-02: 40 mg via INTRAVENOUS
  Filled 2023-05-02: qty 4

## 2023-05-02 MED ORDER — LURASIDONE HCL 40 MG PO TABS
40.0000 mg | ORAL_TABLET | Freq: Every day | ORAL | Status: DC
  Filled 2023-05-02: qty 1

## 2023-05-02 MED ORDER — FENTANYL 2500MCG IN NS 250ML (10MCG/ML) PREMIX INFUSION
50.0000 ug/h | INTRAVENOUS | Status: DC
  Administered 2023-05-02: 50 ug/h via INTRAVENOUS
  Administered 2023-05-03 – 2023-05-05 (×2): 100 ug/h via INTRAVENOUS
  Administered 2023-05-07: 175 ug/h via INTRAVENOUS
  Filled 2023-05-02 (×4): qty 250

## 2023-05-02 MED ORDER — PROSOURCE TF20 ENFIT COMPATIBL EN LIQD
60.0000 mL | Freq: Every day | ENTERAL | Status: DC
  Administered 2023-05-02 – 2023-05-06 (×5): 60 mL
  Filled 2023-05-02 (×5): qty 60

## 2023-05-02 MED ORDER — GUAIFENESIN 100 MG/5ML PO LIQD
10.0000 mL | Freq: Four times a day (QID) | ORAL | Status: DC
  Administered 2023-05-02 – 2023-05-06 (×17): 10 mL
  Filled 2023-05-02 (×17): qty 10

## 2023-05-02 MED ORDER — ROCURONIUM BROMIDE 10 MG/ML (PF) SYRINGE
PREFILLED_SYRINGE | INTRAVENOUS | Status: AC
  Administered 2023-05-02: 100 mg via INTRAVENOUS
  Filled 2023-05-02: qty 10

## 2023-05-02 MED ORDER — VENLAFAXINE HCL 75 MG PO TABS
75.0000 mg | ORAL_TABLET | Freq: Two times a day (BID) | ORAL | Status: DC
  Administered 2023-05-03 – 2023-05-06 (×8): 75 mg
  Filled 2023-05-02 (×9): qty 1

## 2023-05-02 MED ORDER — ACETAMINOPHEN 325 MG PO TABS
650.0000 mg | ORAL_TABLET | Freq: Three times a day (TID) | ORAL | Status: DC
  Administered 2023-05-02: 650 mg via ORAL
  Filled 2023-05-02: qty 2

## 2023-05-02 NOTE — Progress Notes (Signed)
eLink Physician-Brief Progress Note Patient Name: Vincent [name] DOB: 08-12-1965 MRN: [id number]   Date of Service  05/02/2023  HPI/Events of Note  Post-intubation CXR reviewed.  eICU Interventions  Tubes and lines in good position, no pneumothorax.        Migdalia Dk 05/02/2023, 8:18 PM

## 2023-05-02 NOTE — Progress Notes (Addendum)
Pharmacy Antibiotic Note  Vincent [name] is a 58 y.o. male admitted on 04/30/2023 with PEA arrest with respiratory infection and concerns for sepsis. Pharmacy has been consulted for Zosyn/vancomycin dosing, also on doxycycline for atypical coverage and oseltamivir for fluA.  Cr up from 1.27 to 2 mg/dl.   Plan: -Continue Zosyn 3.375g IV EI q8h -Adjust oseltamivir to 75mg  x1 then 30mg  BID -Check vancomycin random - will likely adjust vancomycin to 1000mg  IV q24h  ADDENDUM 1150: VR elevated 12h after last dose, will recheck in am, hold vanco for now  Height: 6' (182.9 cm) Weight: (P) 85.1 kg (187 lb 9.8 oz) IBW/kg (Calculated) : 77.6  Temp (24hrs), Avg:97.4 F (36.3 C), Min:96.8 F (36 C), Max:97.9 F (36.6 C)  Recent Labs  Lab 04/30/23 1814 04/30/23 1827 04/30/23 1832 04/30/23 2010 05/01/23 0500 05/02/23 0416  WBC SPECIMEN CONTAMINATED, UNABLE TO PERFORM TEST(S).  --   --  37.0* 22.2* 12.9*  CREATININE  --  1.00  --  1.25* 1.27* 2.00*  LATICACIDVEN  --   --  4.1*  --   --   --     Estimated Creatinine Clearance: 45.8 mL/min (A) (by C-G formula based on SCr of 2 mg/dL (H)).    Antimicrobials this admission: Zosyn 2/17 >>  Vancomycin 2/17 >>  Doxycycline 2/18 >> Oseltamivir 2/19 >>  Microbiology results: 2/17 BCx: pending 2/18 MRSA PCR: positive   Fredonia Highland, PharmD, BCPS, [facility] Clinical Pharmacist [phone] Please check AMION for all [facility] Pharmacy numbers 05/02/2023

## 2023-05-02 NOTE — Evaluation (Addendum)
Occupational Therapy Evaluation Patient Details Name: Vincent [name] MRN: [id number] DOB: 1965/11/10 Today's Date: 05/02/2023   History of Present Illness   58 year old man who presented to [facility] ED 2/17 after OOH cardiac arrest. Extubated 2/19. PMH: HTN, HLD, CAD, TIA, COPD, mild OSA, T2DM (c/b gastroparesis and diabetic peripheral neuropathy), hypothyroidism, GERD, PUD/H. Pylori), bladder CA, hypogonadism (s/p bilateral orchiectomy), DDD with chronic back pain, RA, benign essential tremor, transient seizure vs. pseudoseizure, bipolar disorder, anxiety, PTSD, was just d/c'd from SNF approx a week ago.     Clinical Impressions Pt discharging from short-term rehab to home approximately a week ago. Just prior to this admission, pt was Independent with dressing and toileting, showered with Supervision to Contact guard assist to wash/dry feet, and was performing functional mobility Mod I with a walker. At baseline, pt receives assistance as needed from his sister for IADLs, including transportation. Pt now presents with decreased activity tolerance, impaired cardiopulmonary status, decreased cognition, generalized B UE weakness, decreased B UE coordination and proprioception, edematous B UE, and decreased safety and independence with functional tasks. Pt currently demonstrates ability to complete UB ADLs with Mod to Max assist, LB ADLs with Total assist, and bed mobility during/in preparation for functional tasks with Mod assist. Pt participated well in session and is motivated to return to PLOF. Pt's VSS on 4L continuous O2 through HFNC throughout session. Pt will benefit from acute skilled OT services to address deficits outlined below and to increase safety and independence with functional tasks. Pt reports a strong preference to discharge home with [facility] OT and assist of sister. Pt expected to make good progress toward goals and OT suspects pt will be able to reach his goal of returning home with [facility] OT  services paired with 24/7 supervision/assistance of family. OT to update discharge recommendation as needed based on pt progress in acute rehab.     If plan is discharge home, recommend the following:   Two people to help with walking and/or transfers;A lot of help with bathing/dressing/bathroom;Assistance with cooking/housework;Assistance with feeding;Direct supervision/assist for medications management;Direct supervision/assist for financial management;Assist for transportation;Help with stairs or ramp for entrance;Supervision due to cognitive status     Functional Status Assessment   Patient has had a recent decline in their functional status and demonstrates the ability to make significant improvements in function in a reasonable and predictable amount of time.     Equipment Recommendations   Other (comment) (TBD based on pt progress in acute rehab)     Recommendations for Other Services         Precautions/Restrictions   Precautions Precautions: Fall Precaution/Restrictions Comments: cortrak Restrictions Weight Bearing Restrictions Per Provider Order: No     Mobility Bed Mobility Overal bed mobility: Needs Assistance Bed Mobility: Rolling, Sidelying to Sit, Sit to Supine Rolling: Mod assist Sidelying to sit: Mod assist, Used rails, HOB elevated   Sit to supine: Mod assist   General bed mobility comments: Assist for trunk elevatation and for elevating B LE into bed    Transfers                   General transfer comment: deffered this session per RN request      Balance Overall balance assessment: Needs assistance Sitting-balance support: Single extremity supported, Bilateral upper extremity supported, No upper extremity supported, Feet supported Sitting balance-Leahy Scale: Fair Sitting balance - Comments: Fair once in upright sitting position, but requiring Mod assist to bring self into sitting in midline  ADL either performed or assessed with clinical judgement   ADL Overall ADL's : Needs assistance/impaired Eating/Feeding: NPO Eating/Feeding Details (indicate cue type and reason): cortrak Grooming: Wash/dry hands;Moderate assistance;Cueing for sequencing;Sitting (cues for intiation)   Upper Body Bathing: Maximal assistance;Cueing for sequencing;Cueing for compensatory techniques;Sitting (cues for initation; with increased time)   Lower Body Bathing: Total assistance;Bed level   Upper Body Dressing : Maximal assistance;Cueing for sequencing;Cueing for compensatory techniques;Sitting (cues for initation; with increased time)   Lower Body Dressing: Total assistance;Bed level     Toilet Transfer Details (indicate cue type and reason): OOB transfers deferred this session per RN request Toileting- Clothing Manipulation and Hygiene: Total assistance;Bed level         General ADL Comments: Pt with good participation throughout session but limited by decreased cognition and decreased activity tolerance.     Vision Baseline Vision/History: 0 No visual deficits Ability to See in Adequate Light: 0 Adequate Patient Visual Report: No change from baseline Additional Comments: Pt reports no change from baseline. OT to further assess vision during functional tasks in future skilled OT sessions.     Perception         Praxis         Pertinent Vitals/Pain Pain Assessment Pain Assessment: Faces Faces Pain Scale: Hurts little more Pain Location: chest and nose due to cortak and O2 nasal cannula Pain Descriptors / Indicators: Discomfort, Grimacing, Guarding Pain Intervention(s): Limited activity within patient's tolerance, Monitored during session, RN gave pain meds during session, Repositioned     Extremity/Trunk Assessment Upper Extremity Assessment Upper Extremity Assessment: Left hand dominant;Generalized weakness;RUE deficits/detail;LUE deficits/detail RUE Deficits /  Details: generalized weakness; decreased coordination; decreased proprioception; edematous throughout UE RUE Sensation: decreased proprioception RUE Coordination: decreased fine motor;decreased gross motor LUE Deficits / Details: generalized weakness; missing 5th digit of L hand with area completely healed and not affecting functional use; decreased coordination; decreased proprioception; edematous throughout UE LUE Sensation: decreased proprioception LUE Coordination: decreased fine motor;decreased gross motor   Lower Extremity Assessment Lower Extremity Assessment: Defer to PT evaluation   Cervical / Trunk Assessment Cervical / Trunk Assessment: Normal   Communication Communication Communication: No apparent difficulties   Cognition Arousal: Alert Behavior During Therapy: WFL for tasks assessed/performed Cognition: Cognition impaired   Orientation impairments: Time, Situation Awareness: Intellectual awareness intact, Online awareness impaired Memory impairment (select all impairments): Working memory Attention impairment (select first level of impairment): Sustained attention Executive functioning impairment (select all impairments): Initiation, Organization, Sequencing, Reasoning, Problem solving OT - Cognition Comments: Pt oriented to self and place and pleasant throughout session. Pt requires increased time for process, word finding, and motor planning. 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                 Following commands: Impaired Following commands impaired: Follows one step commands inconsistently, Follows one step commands with increased time, Follows multi-step commands inconsistently, Follows multi-step commands with increased time     Cueing  General Comments   Cueing Techniques: Verbal cues;Tactile cues      Exercises     Shoulder Instructions      Home Living Family/patient expects to be  discharged to:: Private residence Living Arrangements: Other relatives Available Help at Discharge: Family;Available 24 hours/day Type of Home: Mobile home Home Access: Stairs to enter Entrance Stairs-Number of Steps: 5 Entrance Stairs-Rails: Left;Right Home Layout: One level     Bathroom Shower/Tub: Walk-in shower;Tub/shower unit (  reports he uses the walk-in shower)   Bathroom Toilet: Handicapped height Bathroom Accessibility: Yes How Accessible: Accessible via walker Home Equipment: Rollator (4 wheels);Shower seat - built Charity fundraiser (2 wheels);BSC/3in1;Cane - single point   Additional Comments: Home equipment per pt report and chart review of OT eval note from 03/29/2023.      Prior Functioning/Environment Prior Level of Function : Needs assist             Mobility Comments: just d/c'd from SNF a week ago, reports using walker ADLs Comments: Pt discharged from SNF approx. a week agoa. Pt reports just prior to this admission, he was dressing and toileting Independently and showering with Supervision to Contact gurad assist for washing/drying feet. Pt reports his sister drives ans assist with other IADLs. Retired Biomedical scientist. Enjoys [employer].    OT Problem List: Decreased strength;Decreased activity tolerance;Impaired balance (sitting and/or standing);Decreased coordination;Decreased cognition;Decreased safety awareness;Cardiopulmonary status limiting activity   OT Treatment/Interventions: Self-care/ADL training;Therapeutic exercise;DME and/or AE instruction;Therapeutic activities;Cognitive remediation/compensation;Patient/family education;Balance training      OT Goals(Current goals can be found in the care plan section)   Acute Rehab OT Goals Patient Stated Goal: to feel better and to return home with [facility] and assist of sister OT Goal Formulation: With patient Time For Goal Achievement: 05/16/23 Potential to Achieve Goals: Good ADL Goals Pt Will Perform  Grooming: with supervision;sitting (sitting EOB for 5 or more minutes with Good balance) Pt Will Perform Upper Body Bathing: with min assist;sitting Pt Will Perform Lower Body Bathing: sitting/lateral leans;sit to/from stand;with mod assist Pt Will Perform Upper Body Dressing: with min assist;sitting Pt Will Perform Lower Body Dressing: with mod assist;sit to/from stand;sitting/lateral leans Pt Will Transfer to Toilet: with min assist;ambulating;bedside commode (with least restrictive AD) Pt Will Perform Toileting - Clothing Manipulation and hygiene: with mod assist;sit to/from stand;sitting/lateral leans   OT Frequency:  Min 1X/week    Co-evaluation              AM-PAC OT "6 Clicks" Daily Activity     Outcome Measure Help from another person eating meals?: Total (cortark) Help from another person taking care of personal grooming?: A Lot Help from another person toileting, which includes using toliet, bedpan, or urinal?: Total Help from another person bathing (including washing, rinsing, drying)?: A Lot Help from another person to put on and taking off regular upper body clothing?: A Lot Help from another person to put on and taking off regular lower body clothing?: Total 6 Click Score: 9   End of Session Equipment Utilized During Treatment: Oxygen Nurse Communication: Mobility status;Other (comment) (Positioning of B UE in bed. RN requesting no OOB this session.)  Activity Tolerance: Patient tolerated treatment well Patient left: in bed;with call bell/phone within reach  OT Visit Diagnosis: Unsteadiness on feet (R26.81);Other abnormalities of gait and mobility (R26.89);Muscle weakness (generalized) (M62.81);Ataxia, unspecified (R27.0);Other symptoms and signs involving cognitive function;Other (comment) (decreased activity tolerance)                Time: 1610-1640 OT Time Calculation (min): 30 min Charges:  OT General Charges $OT Visit: 1 Visit OT Evaluation $OT Eval  Moderate Complexity: 1 Mod OT Treatments $Therapeutic Activity: 8-22 mins  Satvik Parco "Orson Eva., OTR/L, MA Acute Rehab [phone]   Lendon Colonel 05/02/2023, 5:22 PM

## 2023-05-02 NOTE — Consult Note (Signed)
WOC Nurse Consult Note: patient known to WOC team from previous admission  Reason for Consult: B heel wounds, suprapubic cath and DTI buttocks  Wound type: 1.  L heel with what appears to be healed DTI (skin peeled but underneath intact)  2.  R heel Unstageable PI  3.  Deep Tissue Pressure Injury buttock/coccyx  Pressure Injury POA: Yes Measurement: 1.  L heel 6 cm x 6 cm peeling skin with underlying tissue intact  2.  R heel 7 cm x 6 cm black soft eschar  3.  Buttocks/coccyx 9 cm x 8 cm evolving Deep Tissue Injury purple maroon discoloration with some peeling epithelium at coccyx - high likelihood to worsen  Wound bed: as above Drainage (amount, consistency, odor)  heels dry; buttocks/coccyx minimal serosanguinous  Periwound: peeling skin B heels  Dressing procedure/placement/frequency:  Cleanse buttocks/coccyx wound with Vashe wound cleanser [place] [phone]), apply Xeroform gauze to wound bed daily, cover with silicone foam.  May lift foam to replace Xeroform. Change foam q3 days and prn soiling.  Cleanse R heel wound with Vashe wound cleanser, apply Xeroform gauze daily to wound bed, cover with dry gauze and Kerlix or silicone foam whichever is preferred.  Apply silicone foam to L heel, no further wound care needed. Place bilateral feet in Prevalon boots to offload pressure.   Patient is wearing Prevalon boots at time of this visit.  I did assess suprapubic catheter, skin intact without excessive drainage. Should do fine with split gauze changed daily.   PLEASE RECONSULT WOC TEAM IF BUTTOCKS WOUND DEVELOPS NECROTIC TISSUE (BLACK/BROWN/YELLOW) AS THIS IS A DEEP TISSUE INJURY THAT IS AT HIGH RISK FOR DETERIORATION.   POC discussed with bedside nurse. WOC team will not follow. Re-consult if further needs arise.   Patient should be placed on alow air loss mattress if moves out of ICU setting.    Thank you,    Priscella Mann MSN, RN-BC, [employer] [phone]

## 2023-05-02 NOTE — Progress Notes (Addendum)
Oxygen requirements continue to worsen,now saturating in mid 80s on 12L [place]. Called sister [name] to discuss my recommendation that we proceed with reintubation before he has unsafe hypoxia. Unfortunately she did not answering and I was unable to leave a VM.   Steffanie Dunn, DO 05/02/23 6:39 PM [facility]  For contact information, see Amion. If no response to pager, please call PCCM consult pager. After hours, 7PM- 7AM, please call Elink.   Patient verbally agrees to reintubation.  Steffanie Dunn, DO 05/02/23 6:45 PM [facility]

## 2023-05-02 NOTE — Procedures (Signed)
Cortrak  Person Inserting Tube:  Harm Jou T, RD Tube Type:  Cortrak - 43 inches Tube Size:  10 Tube Location:  Right nare Initial Placement:  Stomach Secured by: Bridle Technique Used to Measure Tube Placement:  Marking at nare/corner of mouth Cortrak Secured At:  72 cm   Cortrak Tube Team Note:  Consult received to place a Cortrak feeding tube.   No x-ray is required. RN may begin using tube.   If the tube becomes dislodged please keep the tube and contact the Cortrak team at [email or web address] for replacement.  If after hours and replacement cannot be delayed, place a NG tube and confirm placement with an abdominal x-ray.    Shelle Iron RD, LDN Contact via [employer].

## 2023-05-02 NOTE — Progress Notes (Signed)
OT Cancellation Note  Patient Details Name: Vincent [name] MRN: [id number] DOB: 03/29/1965   Cancelled Treatment:    Reason Eval/Treat Not Completed: Medical issues which prohibited therapy (Pt currently intubated with plan to extubate later this day. Due to this, RN requesting holding OT eval at this time. OT to reattempt to see pt later this day as appropriate/available.)  Rosanne Sack "Vincent Eva., OTR/L, MA Acute Rehab [phone]  Lendon Colonel 05/02/2023, 12:18 PM

## 2023-05-02 NOTE — Progress Notes (Signed)
NAME:  Vincent [name], MRN:  [id number], DOB:  02-09-1966, LOS: 2 ADMISSION DATE:  04/30/2023 CONSULTATION DATE:  04/30/2023 REFERRING MD:  Renaye Rakers - EDP, CHIEF COMPLAINT:  Cardiac arrest   History of Present Illness:  58 year old man who presented to [facility] ED 2/17 after OOH cardiac arrest. PMHx significant for HTN, HLD, CAD, TIA, COPD, mild OSA, T2DM (c/b gastroparesis and diabetic peripheral neuropathy), hypothyroidism, GERD, PUD/H. Pylori), bladder CA, hypogonadism (s/p bilateral orchiectomy), DDD with chronic back pain, RA, benign essential tremor, transient seizure vs. pseudoseizure, bipolar disorder, anxiety, PTSD.  Patient is intubated, therefore history is obtained from chart review and from patient's sister/caregiver (at bedside). Patient was reportedly discharged home from rehab SNF ~ 1 week PTA; tested positive for influenza at the facility and facility MD called Tamiflu in. Reportedly taking Tamiflu at home. Patient had also reported UTI symptoms to his sister and complained of not feeling well. On evening of admission, patient was found unresponsive and apneic. CPR started by sister with coaching from 911 dispatcher. Fire arrived and continued CPR; on EMS arrival initial rhythm was PEA. ROSC obtained after 15 mins of ACLS measures including Epi x 2, NS bolus, nebs and intubation.   On arrival to ER, patient had a GCS of 3 and was requiring Epi gtt. Labs were notable for WBC 37, Hgb 8.5 (near baseline), Plt 444. Na 139, K 3.3, CO2 17, Cr 1.25 (baseline 0.8-1). Glucose 509. Transaminases WNL, Alk Phos 136, Tbili 0.5. INR 1.3. Trop 13. PCT 0.42. Flu A+. BCx, UCx and Resp Cx pending. Broad-spectrum Vanc/Zosyn started. CXR demonstrated bilateral diffuse interstitial hazy airspace opacities. CT Head NAICA, C-spine without acute findings. CTA PE Protocol negative for PE, +diffuse airspace/GGO most prominent in LLL c/w PNA and trace L pleural fluid, bilateral rib fractures. CT Abd with stool ball in  rectum c/f fecal impaction, +suprapubic catheter within thick-walled bladder. Of noted, at the time of exam, patient was awake and intermittently following commands.  PCCM consulted for ICU admission.  Pertinent Medical History:   Past Medical History:  Diagnosis Date   Anxiety    Benign essential tremor    Bipolar 2 disorder (HCC)    followed by [facility]--- dr s. Lolly Mustache   Bladder cancer [facility])    recurrent   CAD (coronary artery disease)    cardiac cath 2003  and 2011 both showed normal coronary arteries w/ preserved lvf;  Non obstructive on CTA Oct 2019.    Chronic pain syndrome    back---- [facility] [place]   Cold extremities    BLE   COPD (chronic obstructive pulmonary disease) (HCC)    DDD (degenerative disc disease), lumbar    Diabetic peripheral neuropathy (HCC)    Gastroparesis    followed by dr Marina Goodell   GERD (gastroesophageal reflux disease)    Hiatal hernia    History of bladder cancer urologist-  previously dr Ronal Fear;  now dr gay   papillay TCC (Ta G1)  s/p TURBT and chemo instillation 2014   History of chest pain 12/2017   heart cath normal   History of encephalopathy 05/27/2015   admission w/ acute encephalopathy thought to be secondary to pain meds and COPD   History of gastric ulcer    History of Helicobacter pylori infection    History of kidney stones    History of TIA (transient ischemic attack) 2008  and 10-19-2018    no residual's   History of traumatic head injury 2010  w/ LOC  per pt needed stitches, hit in head with a mower blade   Hyperlipidemia    Hypertension    Hypogonadism male    s/p  bilateral orchiectomy   Hypothyroidism    Insomnia    Mild obstructive sleep apnea    study in epic 12-04-2016, no cpap   PTSD (post-traumatic stress disorder)    chronic   PTSD (post-traumatic stress disorder)    RA (rheumatoid arthritis) (HCC)    followed by [facility]   Seizures, transient [facility]) neurologist-  dr Terrace Arabia--   differential dx complex partial seizure .vs.  mood disorder .vs.  pseudoseizure--  negative EEG's   confusion episodes and staring spells since 11/ 2015   (03-26-2020 per pt wife last seizure 10 /2021)   Suicide attempt by drug overdose (HCC) 07/28/2021   Transient confusion NEUOROLOGIST-  DR Terrace Arabia   Episodes since 11/ 2015--  neurologist dx  differential complex partial seizure  .vs. mood disorder . vs. pseudoseizure   Type 2 diabetes mellitus treated with insulin [facility])    endocrinologist--- dr Everardo All---  (03-26-2020 pt does not check blood sugar at home)    Significant Hospital Events: Including procedures, antibiotic start and stop dates in addition to other pertinent events   2/17 - Presented to [facility] via EMS post-OOH cardiac arrest. Intubated. Labs as above. CT Head NAICA, C-spine negative, CTA PE protocol negative for PE but +hazy interstitial and GGOs c/f PNA, CT Abd with stool ball/thick walled bladder with catheter in place. Vanc/Zosyn started. Epi gtt weaning over to Levo. PCCM consulted for ICU admission.  Interim History / Subjective:  This morning he denies complaints.  Afebrile, less secretions this morning per RT.  Objective:  Blood pressure 137/70, pulse 90, temperature 98.6 F (37 C), temperature source Axillary, resp. rate (!) 26, height 6' (1.829 m), weight (P) 85.1 kg, SpO2 99%.    Vent Mode: PRVC FiO2 (%):  [40 %] 40 % Set Rate:  [26 bmp] 26 bmp Vt Set:  [620 mL] 620 mL PEEP:  [5 cmH20] 5 cmH20 Pressure Support:  [10 cmH20] 10 cmH20 Plateau Pressure:  [17 cmH20-22 cmH20] 22 cmH20   Intake/Output Summary (Last 24 hours) at 05/02/2023 1015 Last data filed at 05/02/2023 0515 Gross per 24 hour  Intake 2010.05 ml  Output 380 ml  Net 1630.05 ml   Filed Weights   04/30/23 2106 05/01/23 0800 05/02/23 0500  Weight: 81 kg 81 kg (P) 85.1 kg   Physical Examination: General: Critically ill-appearing man lying in bed no acute distress HEENT: [place] M. [place]int[place]-AT, eyes anicteric,  endotracheal tube in place Neuro: RASS -1, following commands, answering yes and no questions CV: S1-S2, regular rate and rhythm PULM: Breathing comfortably on mechanical ventilation, less secretions, no rhonchi today.  CTA.  Moderate strength cough. GI: Soft, nontender Extremities: No muscle mass, no significant edema  Trop 374 downtrending  Bicarb 18 BUN 26 Creatinine 2 WBC 12.9 H/H 7.2/22.2 Platelets 235 Trach aspirate-abundant PMN, rare yeast Urine-yeast Blood cultures-no growth x 2 days Echocardiogram- LVEF 60 to 65%,no RWMA. RV size and function is normal.  Resolved Hospital Problem List:    Assessment & Plan:  PEA cardiac arrest, OOH- found down by family.   -wean pressors as able; goal sBP >65 -supportive care, tele monitoring  Influenza A Undifferentiated shock, primarily septic Septic shock due to LLL aspiration pneumonia ; suprapubic catheter in place for chronic urinary retention but likely colonized History of ESBL infection and MRSA. Cx data from 01/2023 [facility]  abscess") MDR Klebsiella pneumoniae, E. Coli and E. Faecalis. Urine Cx data may be difficult to interpret (pending organism growth) due to likely colonization and nonsterile catheter. -NE to maintain MAP > 65  -con't to follow cultures -con't broad antibiotics- zosyn, doxy, tamiflu prolonged course  Acute encephalopathy: post arrest; hypercarbic resp failure> resolved Anisocoria: right pupil > left -family has been at bedside -PT, OT, SLP -avoid sedating meds  Acute hypoxemic and hypercarbic respiratory failure post-cardiac arrest Multifocal pneumonia Influenza A infection History of COPD History of mild OSA -CPT, 3% saline nebs -tamiflu, antibiotics -LTVV -VAP prevention protocol -PAD protocol for sedation -daily SAT & SBT> passed, extubated to [place] -duonebs as needed  Paroxysmal Afib: new onset, now NSR HTN HLD CAD -tele monitoring -plavix, statin -no need for [facility] unless Afib recurs  T2DM  with hyperglycemia Gastroparesis -SSI PRN -goal bG 140-180  GERD History of PUD/ H. Pylori -con't PPI  AKI, mild, likely ATN in the setting of sepsis/cardiac arrest Hypokalemia Hyperphosphatemia History of bladder CA Indwelling suprapubic catheter -Con't suprapubic catheter -strict I/O -renally dose meds, avoid nephrotoxic meds -monitor  Pressure wounds of bilateral heels, POA - wound care, supplements, offload -wean pressors off  RA DDD Chronic back pain -hold PTA plaquenil and lyrica  Bipolar disorder Anxiety PTSD Transient seizures vs. pseudoseizure Essential tremor Plan: - resumePTA latuda, effexor, requip  Hx of CVA/prior TIA -statin, plavix  Sister updated at bedside during rounds.   Best Practice: (right click and "Reselect all SmartList Selections" daily)   Diet/type: tubefeeds DVT prophylaxis: prophylactic heparin   GI prophylaxis: PPI Lines: Central line and Arterial Line Foley:  N/A - indwelling suprapubic catheter noted on admission Code Status:  full code Last date of multidisciplinary goals of care discussion [2/18 - Sister, Renee updated at bedside]  Labs:  CBC: Recent Labs  Lab 04/30/23 1814 04/30/23 1827 04/30/23 1955 04/30/23 2010 05/01/23 0349 05/01/23 0500 05/02/23 0416  WBC SPECIMEN CONTAMINATED, UNABLE TO PERFORM TEST(S).  --   --  37.0*  --  22.2* 12.9*  HGB SPECIMEN CONTAMINATED, UNABLE TO PERFORM TEST(S).   < > 8.2* 8.5* 8.2* 8.2* 7.2*  HCT SPECIMEN CONTAMINATED, UNABLE TO PERFORM TEST(S).   < > 24.0* 26.8* 24.0* 25.0* 22.2*  MCV SPECIMEN CONTAMINATED, UNABLE TO PERFORM TEST(S).  --   --  83.2  --  80.1 80.7  PLT SPECIMEN CONTAMINATED, UNABLE TO PERFORM TEST(S).  --   --  444*  --  340 235   < > = values in this interval not displayed.   Basic Metabolic Panel: Recent Labs  Lab 04/30/23 1827 04/30/23 1955 04/30/23 2010 05/01/23 0349 05/01/23 0500 05/02/23 0416  NA 139 141 139 141 139 138  K 3.9 3.2* 3.3* 4.3 4.5  4.0  CL 108  --  110  --  112* 107  CO2  --   --  17*  --  18* 18*  GLUCOSE 523*  --  509*  --  178* 84  BUN 16  --  15  --  19 26*  CREATININE 1.00  --  1.25*  --  1.27* 2.00*  CALCIUM  --   --  8.0*  --  7.6* 7.9*  MG  --   --  1.7  --  1.4* 2.2  PHOS  --   --  6.4*  --  3.7  --    GFR: Estimated Creatinine Clearance: 45.8 mL/min (A) (by C-G formula based on SCr of 2 mg/dL (H)). Recent Labs  Lab 04/30/23 1814 04/30/23 1832 04/30/23 2010 05/01/23 0500 05/02/23 0416  PROCALCITON  --   --  0.42  --   --   WBC SPECIMEN CONTAMINATED, UNABLE TO PERFORM TEST(S).  --  37.0* 22.2* 12.9*  LATICACIDVEN  --  4.1*  --   --   --     Critical care time:   This patient is critically ill with multiple organ system failure which requires frequent high complexity decision making, assessment, support, evaluation, and titration of therapies. This was completed through the application of advanced monitoring technologies and extensive interpretation of multiple databases. During this encounter critical care time was devoted to patient care services described in this note for 40 minutes.  Steffanie Dunn, DO 05/02/23 3:20 PM [facility]  For contact information, see Amion. If no response to pager, please call PCCM consult pager. After hours, 7PM- 7AM, please call Elink.

## 2023-05-02 NOTE — Evaluation (Signed)
Physical Therapy Evaluation Patient Details Name: Vincent [name] MRN: [id number] DOB: December 20, 1965 Today's Date: 05/02/2023  History of Present Illness  58 year old man who presented to [facility] ED 2/17 after OOH cardiac arrest. PMH: HTN, HLD, CAD, TIA, COPD, mild OSA, T2DM (c/b gastroparesis and diabetic peripheral neuropathy), hypothyroidism, GERD, PUD/H. Pylori), bladder CA, hypogonadism (s/p bilateral orchiectomy), DDD with chronic back pain, RA, benign essential tremor, transient seizure vs. pseudoseizure, bipolar disorder, anxiety, PTSD, was just d/c'd from SNF approx a week ago.   Clinical Impression  Pt intubated but was able to follow commands and participate in EOB therapy. Plan is for extubation today. Suspect pt to progress well and be able to d/c home with sister and [facility] services once medically stable. Pt able to sit EOB with close supervision and participated in dynamic reaching without LOB. Pt with noted onset of fatigue and decreased activity tolerance as well. Acute PT to cont to follow.        If plan is discharge home, recommend the following: A little help with walking and/or transfers;A little help with bathing/dressing/bathroom;Help with stairs or ramp for entrance   Can travel by private vehicle        Equipment Recommendations None recommended by PT  Recommendations for Other Services       Functional Status Assessment Patient has had a recent decline in their functional status and demonstrates the ability to make significant improvements in function in a reasonable and predictable amount of time.     Precautions / Restrictions Precautions Precautions: Fall Precaution/Restrictions Comments: intubated Restrictions Weight Bearing Restrictions Per Provider Order: No      Mobility  Bed Mobility Overal bed mobility: Needs Assistance Bed Mobility: Rolling, Sidelying to Sit, Sit to Supine Rolling: Mod assist Sidelying to sit: Mod assist, +2 for safety/equipment, HOB  elevated   Sit to supine: Mod assist, +2 for physical assistance, +2 for safety/equipment   General bed mobility comments: pt with great initiation of task, assist for trunk elevation to EOB and for LEs back into bed    Transfers                   General transfer comment: unable this date due to weaning off vent    Ambulation/Gait               General Gait Details: unable this date  Stairs            Wheelchair Mobility     Tilt Bed    Modified Rankin (Stroke Patients Only)       Balance Overall balance assessment: Needs assistance Sitting-balance support: Feet supported, No upper extremity supported Sitting balance-Leahy Scale: Fair Sitting balance - Comments: pt able to do dynamic reaching without loss of balance                                     Pertinent Vitals/Pain Pain Assessment Pain Assessment: No/denies pain    Home Living Family/patient expects to be discharged to:: Private residence Living Arrangements: Other relatives Available Help at Discharge: Family;Available 24 hours/day Type of Home: Mobile home Home Access: Stairs to enter Entrance Stairs-Rails: Lawyer of Steps: 5   Home Layout: One level Home Equipment: Rollator (4 wheels) Additional Comments: pt reports doing dressing/bathing himself, sister drives    Prior Function Prior Level of Function : Needs assist  Mobility Comments: just d/c'd from SNF a week ago, reports using walker ADLs Comments: pt reports doing dressing and bathing on own, sister drives     Extremity/Trunk Assessment   Upper Extremity Assessment Upper Extremity Assessment: Generalized weakness    Lower Extremity Assessment Lower Extremity Assessment: Generalized weakness    Cervical / Trunk Assessment Cervical / Trunk Assessment: Normal  Communication   Communication Communication: Impaired Factors Affecting Communication:  Trach/intubated (able to shake head yes/no)    Cognition Arousal: Alert Behavior During Therapy: WFL for tasks assessed/performed                           PT - Cognition Comments: pt intubated with bilat wrist restraints on however is cooperative, follows commands consistently, approriate shakes head yes/no to questions Following commands: Intact       Cueing Cueing Techniques: Verbal cues     General Comments General comments (skin integrity, edema, etc.): pt with bilat heel ulcers, R worse than L, sacral ulcer    Exercises     Assessment/Plan    PT Assessment Patient needs continued PT services  PT Problem List Decreased strength;Decreased activity tolerance;Decreased balance;Decreased mobility;Decreased coordination;Decreased cognition       PT Treatment Interventions DME instruction;Gait training;Stair training;Functional mobility training;Therapeutic activities;Therapeutic exercise    PT Goals (Current goals can be found in the Care Plan section)  Acute Rehab PT Goals Patient Stated Goal: didn't state PT Goal Formulation: With patient Time For Goal Achievement: 05/15/23 Potential to Achieve Goals: Good    Frequency Min 1X/week     Co-evaluation               AM-PAC PT "6 Clicks" Mobility  Outcome Measure Help needed turning from your back to your side while in a flat bed without using bedrails?: A Lot Help needed moving from lying on your back to sitting on the side of a flat bed without using bedrails?: A Lot Help needed moving to and from a bed to a chair (including a wheelchair)?: A Lot Help needed standing up from a chair using your arms (e.g., wheelchair or bedside chair)?: Total Help needed to walk in hospital room?: Total Help needed climbing 3-5 steps with a railing? : Total 6 Click Score: 9    End of Session Equipment Utilized During Treatment: Oxygen Activity Tolerance: Patient tolerated treatment well Patient left: in bed;with  call bell/phone within reach;with nursing/sitter in room;with restraints reapplied Nurse Communication: Mobility status PT Visit Diagnosis: Unsteadiness on feet (R26.81);Other abnormalities of gait and mobility (R26.89);Muscle weakness (generalized) (M62.81)    Time: 4166-0630 PT Time Calculation (min) (ACUTE ONLY): 19 min   Charges:   PT Evaluation $PT Eval Moderate Complexity: 1 Mod   PT General Charges $$ ACUTE PT VISIT: 1 Visit         Lewis Shock, PT, DPT Acute Rehabilitation Services Secure chat preferred Office #: [phone]   Iona Hansen 05/02/2023, 8:30 AM

## 2023-05-02 NOTE — Progress Notes (Signed)
Nutrition Follow-up  DOCUMENTATION CODES:   Non-severe (moderate) malnutrition in context of chronic illness  INTERVENTION:  Initiate tube feeding Cortrak:  Vital 1.5 at 20 ml/h and advance by 10 ml every 8 hours until goal of 65 ml/hr (1560 ml per day) Prosource TF20 60 ml daily   Provides 2420 kcal, 125 gm protein, 1192 ml free water daily   500 mg Vitamin C and 220 mg Zinc per day x 30 days for wound healing 100 mg Thiamine x 7 days  Monitor magnesium, potassium, and phosphorus daily for at least 3 days, MD to replete as needed, as pt is at risk for refeeding syndrome.  Recommend Q4H insulin for tube feeding coverage if blood sugars are uncontrolled   NUTRITION DIAGNOSIS:   Moderate Malnutrition related to chronic illness as evidenced by moderate fat depletion, moderate muscle depletion.  - Still applicable   GOAL:   Patient will meet greater than or equal to 90% of their needs  - progressing   MONITOR:   Diet advancement, I & O's, Labs, Weight trends, Vent status, TF tolerance  REASON FOR ASSESSMENT:   Ventilator    ASSESSMENT:   58 y.o male admitted after cardiac arrest. PMH of HTN, HLD, CAD, TIA, COPD, T2DM, gastroparesis, diabetic peripheral neuropathy, hypothyroidism, GERD, bladder CA, hypogonadism, DDD, RA, seizures, bipolar disorder, anxiety, PTSD.  2/17 - Intubated  2/19 - Cortrak placed , tip in the stomach   Cortrak placed today tip in stomach, ok to start TF per MD. Will use semi elemental formula for increased tolerance since patient has history of GI issues. New PI noted from [facility] RN, will add vitamin C and Zinc to TF to support wound healing, can consider Juven if patient tolerates TF.   Patient has no insulin coverage for tube feeds, messaged MD. Plan for SSI for now. RN reports plan to extubate today. No BM yet.    Patient is currently intubated on ventilator support MV: 11.8 L/min Temp (24hrs), Avg:97.6 F (36.4 C), Min:96.8 F (36 C), Max:98.6  F (37 C)  Admit weight: 81 kg - ? Accuracy   Current weight: 85.1 kg - ? Accuracy    Intake/Output Summary (Last 24 hours) at 05/02/2023 1050 Last data filed at 05/02/2023 0515 Gross per 24 hour  Intake 2010.05 ml  Output 380 ml  Net 1630.05 ml   Net IO Since Admission: 3,457.11 mL [05/02/23 1050]  Nutritionally Relevant Medications: Scheduled Meds:  ascorbic acid  500 mg Per Tube Daily   docusate  100 mg Per Tube BID   feeding supplement (PROSource TF20)  60 mL Per Tube Daily   guaiFENesin  10 mL Oral Q6H   insulin aspart  2-6 Units Subcutaneous Q4H   polyethylene glycol  17 g Per Tube Daily   sodium chloride HYPERTONIC  4 mL Nebulization Q4H while awake   thiamine  100 mg Per Tube Daily   zinc sulfate (50mg  elemental zinc)  220 mg Per Tube Daily   Continuous Infusions:  doxycycline (VIBRAMYCIN) IV 100 mg (05/02/23 0833)   feeding supplement (VITAL 1.5 CAL)     fentaNYL infusion INTRAVENOUS 75 mcg/hr (05/02/23 0430)   norepinephrine (LEVOPHED) Adult infusion 1 mcg/min (05/02/23 0430)   piperacillin-tazobactam (ZOSYN)  IV 3.375 g (05/02/23 0410)   propofol (DIPRIVAN) infusion Stopped (05/01/23 0937)   Labs Reviewed: BUN 26, Creatinine 2, Calcium 7.9  CBG ranges from 82-479 mg/dL over the last 24 hours HgbA1c 8.6  Diet Order:   Diet Order  Diet NPO time specified  Diet effective now                   EDUCATION NEEDS:   Education needs have been addressed  Skin:  Skin Assessment: Skin Integrity Issues: Skin Integrity Issues:: Incisions DTI: L heel and Buttcoks/coccyx Unstageable: R heel Incisions: Perineum  Last BM:  PTA  Height:   Ht Readings from Last 1 Encounters:  04/30/23 6' (1.829 m)    Weight:   Wt Readings from Last 1 Encounters:  05/02/23 (P) 85.1 kg    Ideal Body Weight:  80.9 kg  BMI:  Body mass index is 25.44 kg/m (pended).  Estimated Nutritional Needs:   Kcal:  2300-2500 kcal  Protein:  120-140 gm  Fluid:   >2L/day   Elliot Dally, RD Registered Dietitian  See Amion for more information

## 2023-05-02 NOTE — Procedures (Signed)
Intubation Procedure Note  Vincent [name]  [id number]  March 04, 1966  Date:05/02/23  Time:7:02 PM   Provider Performing:Emerly Prak P Chestine Spore    Procedure: Intubation (31500)  Indication(s) Respiratory Failure  Consent Risks of the procedure as well as the alternatives and risks of each were explained to the patient and/or caregiver.  Consent for the procedure was obtained and is signed in the bedside chart   Anesthesia Etomidate, Versed, and Rocuronium   Time Out Verified patient identification, verified procedure, site/side was marked, verified correct patient position, special equipment/implants available, medications/allergies/relevant history reviewed, required imaging and test results available.   Sterile Technique Usual hand hygeine, masks, and gloves were used   Procedure Description Patient positioned in bed supine.  Sedation given as noted above.  Patient was intubated with endotracheal tube using Glidescope.  View was Grade 1 full glottis .  Number of attempts was 1.  Colorimetric CO2 detector was consistent with tracheal placement.   Complications/Tolerance None; patient tolerated the procedure well. Chest X-ray is ordered to verify placement.   EBL Minimal   Specimen(s) None  Steffanie Dunn, DO 05/02/23 7:02 PM [facility]

## 2023-05-02 NOTE — TOC Initial Note (Signed)
Transition of Care [facility]) - Initial/Assessment Note    Patient Details  Name: Vincent [name] MRN: [id number] Date of Birth: 08-27-1965  Transition [facility]) CM/SW Contact:    Elliot Cousin, RN Phone Number: [phone] 05/02/2023, 12:27 PM  Clinical Narrative:                 TOC CM spoke to pt's dtr at bedside. States pt was needing assistance at home. States he has shower chair, bedside commode, rolling walker, and wheelchair at home. States he has been to [facility] in past. Pt did not like facility. Will need PT/OT evaluation for recommendations. Dtr agreeable to IP rehab or SNF rehab. Will continue to follow for dc needs.   Expected Discharge Plan: IP Rehab Facility Barriers to Discharge: Continued Medical Work up   Patient Goals and CMS Choice            Expected Discharge Plan and Services   Discharge Planning Services: CM Consult Post Acute Care Choice: IP Rehab Living arrangements for the past 2 months: Single Family Home                                      Prior Living Arrangements/Services Living arrangements for the past 2 months: Single Family Home Lives with:: Relatives Patient language and need for interpreter reviewed:: No        Need for Family Participation in Patient Care: Yes (Comment) Care giver support system in place?: Yes (comment) Current home services: DME (cane, walker, w/c, elevated commode, shower seat, eye glasses) Criminal Activity/Legal Involvement Pertinent to Current Situation/Hospitalization: No - Comment as needed  Activities of Daily Living   ADL Screening (condition at time of admission) Independently performs ADLs?: No Does the patient have a NEW difficulty with bathing/dressing/toileting/self-feeding that is expected to last >3 days?: Yes (Initiates electronic notice to provider for possible OT consult) Does the patient have a NEW difficulty with getting in/out of bed, walking, or climbing stairs  that is expected to last >3 days?: Yes (Initiates electronic notice to provider for possible PT consult) Does the patient have a NEW difficulty with communication that is expected to last >3 days?: Yes (Initiates electronic notice to provider for possible SLP consult) Is the patient deaf or have difficulty hearing?: No Does the patient have difficulty seeing, even when wearing glasses/contacts?: No Does the patient have difficulty concentrating, remembering, or making decisions?: Yes  Permission Sought/Granted Permission sought to share information with : Case Manager Permission granted to share information with : Yes, Verbal Permission Granted  Share Information with NAME: Sachit Gilman     Permission granted to share info w Relationship: daughter  Permission granted to share info w Contact Information: [phone]  Emotional Assessment       Orientation: : Oriented to Self, Oriented to Place, Oriented to  Time, Oriented to Situation   Psych Involvement: No (comment)  Admission diagnosis:  Cardiac arrest (HCC) [I46.9] Abdominal pain, right upper quadrant [R10.11] Patient Active Problem List   Diagnosis Date Noted   Malnutrition of moderate degree 05/01/2023   Cardiac arrest (HCC) 04/30/2023   Abscess, jaw 03/25/2023   Facial abscess 03/24/2023   Hx of abdominal abscess 01/18/2023   Sepsis with acute renal failure (HCC) 09/25/2022   Abscess, gluteal, right 09/25/2022   Peripheral neuropathy in hands 06/08/2022   Essential hypertension 05/23/2022   BPH (benign  prostatic hyperplasia) 05/23/2022   AKI (acute kidney injury) (HCC) 09/04/2021   Hypotension 09/03/2021   History of seizure 08/02/2021   Cerebral thrombosis with cerebral infarction 11/16/2020   Ischemic stroke (HCC) 06/10/2020   Pain due to onychomycosis of toenails of both feet 01/28/2020   Hyperglycemia due to type 2 diabetes mellitus (HCC) 01/07/2020   Resistance to insulin 01/07/2020   Right leg weakness  04/24/2019   Right leg pain 04/24/2019   Gait abnormality 04/24/2019   TIA (transient ischemic attack) 10/20/2018   DM2 (diabetes mellitus, type 2) (HCC) 10/19/2018   Left sided numbness 10/19/2018   Diabetic autonomic neuropathy associated with type 2 diabetes mellitus (HCC) 02/01/2018   DM type 2 with diabetic peripheral neuropathy (HCC) 02/01/2018   Dyslipidemia 01/31/2018   Coronary artery disease involving native coronary artery of native heart without angina pectoris 01/31/2018   Chest pain 01/01/2018   Breakthrough seizure (HCC) 12/19/2016   Essential tremor 12/19/2016   Chronic post-traumatic stress disorder (PTSD) 06/16/2016   Tremor 06/16/2016   Tobacco abuse 05/05/2016   Altered mental status 05/28/2015   Type 2 diabetes mellitus with hyperglycemia (HCC) 05/28/2015   Hypothyroidism 05/28/2015   Bipolar 2 disorder (HCC) 05/28/2015   History of rheumatoid arthritis 05/28/2015   Chronic pain 05/28/2015   Bipolar II disorder, most recent episode major depressive (HCC) 03/18/2015   Carpal tunnel syndrome on left 02/10/2015   Carpal tunnel syndrome on right 02/10/2015   Diabetes (HCC) 01/23/2015   Obesity (BMI 30-39.9) 03/06/2014   Acute encephalopathy 03/05/2014   Hyperlipidemia 03/05/2014   Anemia, normocytic normochromic 03/05/2014   Altered mental state    Helicobacter pylori (H. pylori) infection 11/20/2012   Protein-calorie malnutrition, severe (HCC) 10/09/2012   Dysphagia 08/19/2012   Gastroparesis 08/19/2012   Bladder tumor 08/08/2012   Biliary dyskinesia 11/02/2010   PCP:  Dois Davenport, MD Pharmacy:   [facility] [address] [place], [place] - [address] [place] [address] Phone: [phone] Fax: [phone]     Social Drivers of Health (SDOH) Social History: SDOH Screenings   Food Insecurity: Patient Unable To Answer (05/01/2023)  Housing: Patient Unable To Answer (05/01/2023)  Transportation  Needs: Patient Unable To Answer (05/01/2023)  Utilities: Patient Unable To Answer (05/01/2023)  Alcohol Screen: Low Risk  (07/31/2021)  Social Connections: Unknown (07/17/2021)   Received from [facility]  Tobacco Use: High Risk (04/30/2023)   SDOH Interventions:     Readmission Risk Interventions    01/22/2023   11:58 AM 05/26/2022    1:26 PM  Readmission Risk Prevention Plan  Transportation Screening Complete   PCP or Specialist Appt within 3-5 Days Complete Complete  HRI or Home Care Consult Complete Complete  Social Work Consult for Recovery Care Planning/Counseling Complete Complete  Palliative Care Screening Not Applicable Not Applicable  Medication Review (RN Care Manager) Referral to Pharmacy Complete

## 2023-05-02 NOTE — Progress Notes (Addendum)
Sudden desaturations with absent left sided breath sounds after CPT.  Up to 6L [place].  CXR ordered Getting 3% saline now, rolled onto R side to provide postural drainage with percussion of left chest.  NTS-- didn't get much. Profoundly weak and poorly coordinated cough.  Prescribing cough assist in addition to CPT, 3% saline nebs Remains on reintubation watch  Additional cc time: 15 min.  Steffanie Dunn, DO 05/02/23 5:24 PM [facility]  For contact information, see Amion. If no response to pager, please call PCCM consult pager. After hours, 7PM- 7AM, please call Elink.     Cough assist and additional NTS with RT. Minimal additional secretions. Poor cough mechanics.  Steffanie Dunn, DO 05/02/23 5:45 PM [facility]  For contact information, see Amion. If no response to pager, please call PCCM consult pager. After hours, 7PM- 7AM, please call Elink.

## 2023-05-02 NOTE — Plan of Care (Signed)
  Problem: Coping: Goal: Ability to adjust to condition or change in health will improve Outcome: Progressing Note: Able to be redirected    Problem: Tissue Perfusion: Goal: Adequacy of tissue perfusion will improve Outcome: Progressing   Problem: Clinical Measurements: Goal: Ability to maintain clinical measurements within normal limits will improve Outcome: Progressing Goal: Respiratory complications will improve Outcome: Progressing Goal: Cardiovascular complication will be avoided Outcome: Progressing   Redness on buttocks worsened. RN continues to offload. Documentation complete. Related 24 hour suggestion of ET tube holders to RT on shift.  Pt is easily startled and pulls on restraints when awakened, is able to be redirected and reoriented.

## 2023-05-02 NOTE — Procedures (Signed)
Extubation Procedure Note  Patient Details:   Name: Vincent [name] [facility] DOB: [birth year] MRN: [id number]   Airway Documentation:    Vent end date: 05/02/23 Vent end time: 1155   Evaluation  O2 sats: stable throughout Complications: No apparent complications Patient did tolerate procedure well. Bilateral Breath Sounds: Clear, Diminished   Yes Pt extubated to [place] 3L , pt tolerating well at this time. Cuff leak present, no stridor noted.   Idelle Leech 05/02/2023, 4:24 PM

## 2023-05-03 DIAGNOSIS — I469 Cardiac arrest, cause unspecified: Secondary | ICD-10-CM | POA: Diagnosis not present

## 2023-05-03 DIAGNOSIS — J9602 Acute respiratory failure with hypercapnia: Secondary | ICD-10-CM | POA: Diagnosis not present

## 2023-05-03 DIAGNOSIS — A419 Sepsis, unspecified organism: Secondary | ICD-10-CM | POA: Diagnosis not present

## 2023-05-03 DIAGNOSIS — J9601 Acute respiratory failure with hypoxia: Secondary | ICD-10-CM | POA: Diagnosis not present

## 2023-05-03 LAB — GLUCOSE, CAPILLARY
Glucose-Capillary: 152 mg/dL — ABNORMAL HIGH (ref 70–99)
Glucose-Capillary: 167 mg/dL — ABNORMAL HIGH (ref 70–99)
Glucose-Capillary: 163 mg/dL — ABNORMAL HIGH (ref 70–99)
Glucose-Capillary: 166 mg/dL — ABNORMAL HIGH (ref 70–99)
Glucose-Capillary: 211 mg/dL — ABNORMAL HIGH (ref 70–99)
Glucose-Capillary: 201 mg/dL — ABNORMAL HIGH (ref 70–99)

## 2023-05-03 LAB — CBC
HCT: 21.1 % — ABNORMAL LOW (ref 39.0–52.0)
Hemoglobin: 6.8 g/dL — CL (ref 13.0–17.0)
MCH: 26.2 pg (ref 26.0–34.0)
MCHC: 32.2 g/dL (ref 30.0–36.0)
MCV: 81.2 fL (ref 80.0–100.0)
Platelets: 191 10*3/uL (ref 150–400)
RBC: 2.6 MIL/uL — ABNORMAL LOW (ref 4.22–5.81)
RDW: 14.6 % (ref 11.5–15.5)
WBC: 11.6 10*3/uL — ABNORMAL HIGH (ref 4.0–10.5)
nRBC: 0 % (ref 0.0–0.2)

## 2023-05-03 LAB — PREPARE RBC (CROSSMATCH)

## 2023-05-03 LAB — BASIC METABOLIC PANEL
Anion gap: 10 (ref 5–15)
BUN: 35 mg/dL — ABNORMAL HIGH (ref 6–20)
CO2: 19 mmol/L — ABNORMAL LOW (ref 22–32)
Calcium: 7.8 mg/dL — ABNORMAL LOW (ref 8.9–10.3)
Chloride: 111 mmol/L (ref 98–111)
Creatinine, Ser: 1.78 mg/dL — ABNORMAL HIGH (ref 0.61–1.24)
GFR, Estimated: 44 mL/min — ABNORMAL LOW (ref >60)
Glucose, Bld: 162 mg/dL — ABNORMAL HIGH (ref 70–99)
Potassium: 3.6 mmol/L (ref 3.5–5.1)
Sodium: 140 mmol/L (ref 135–145)

## 2023-05-03 LAB — ABO/RH: ABO/RH(D): A NEG

## 2023-05-03 LAB — PHOSPHORUS: Phosphorus: 5.1 mg/dL — ABNORMAL HIGH (ref 2.5–4.6)

## 2023-05-03 LAB — MAGNESIUM: Magnesium: 2.2 mg/dL (ref 1.7–2.4)

## 2023-05-03 LAB — HEMOGLOBIN AND HEMATOCRIT, BLOOD
HCT: 24.3 % — ABNORMAL LOW (ref 39.0–52.0)
Hemoglobin: 7.9 g/dL — ABNORMAL LOW (ref 13.0–17.0)

## 2023-05-03 MED ORDER — SODIUM CHLORIDE 0.9% IV SOLUTION: Freq: Once | INTRAVENOUS | Status: AC

## 2023-05-03 MED ORDER — OXYCODONE HCL 5 MG PO TABS: 5.0000 mg | ORAL_TABLET | ORAL | Status: DC | PRN

## 2023-05-03 MED ORDER — CLOPIDOGREL BISULFATE 75 MG PO TABS
75.0000 mg | ORAL_TABLET | Freq: Every day | ORAL | Status: DC
  Administered 2023-05-03: 75 mg
  Filled 2023-05-03: qty 1

## 2023-05-03 MED ORDER — LURASIDONE HCL 40 MG PO TABS
40.0000 mg | ORAL_TABLET | Freq: Every day | ORAL | Status: DC
  Administered 2023-05-03 – 2023-05-06 (×4): 40 mg
  Filled 2023-05-03 (×4): qty 1

## 2023-05-03 MED ORDER — FUROSEMIDE 10 MG/ML IJ SOLN
40.0000 mg | Freq: Three times a day (TID) | INTRAMUSCULAR | Status: AC
  Administered 2023-05-03 (×2): 40 mg via INTRAVENOUS
  Filled 2023-05-03 (×2): qty 4

## 2023-05-03 NOTE — Progress Notes (Signed)
Nutrition Follow-up  DOCUMENTATION CODES:   Non-severe (moderate) malnutrition in context of chronic illness  INTERVENTION:   Tube feeding Cortrak:  Vital 1.5 at 20 ml/h and advance by 10 ml every 8 hours until goal of 65 ml/hr (1560 ml per day) Prosource TF20 60 ml daily   Provides 2420 kcal, 125 gm protein, 1192 ml free water daily   500 mg Vitamin C and 220 mg Zinc per day x 30 days for wound healing 100 mg Thiamine x 7 days  Monitor magnesium, potassium, and phosphorus daily for at least 3 days, MD to replete as needed, as pt is at risk for refeeding syndrome.   NUTRITION DIAGNOSIS:   Moderate Malnutrition related to chronic illness as evidenced by moderate fat depletion, moderate muscle depletion.  - Still applicable   GOAL:   Patient will meet greater than or equal to 90% of their needs  - Meeting via TF  MONITOR:   Diet advancement, I & O's, Labs, Weight trends, Vent status, TF tolerance  REASON FOR ASSESSMENT:   Ventilator    ASSESSMENT:   58 y.o male admitted after cardiac arrest. PMH of HTN, HLD, CAD, TIA, COPD, T2DM, gastroparesis, diabetic peripheral neuropathy, hypothyroidism, GERD, bladder CA, hypogonadism, DDD, RA, seizures, bipolar disorder, anxiety, PTSD.  2/17 - Intubated  2/19 - Cortrak placed , tip in the stomach. Extubated, failed trial an reintubated    Patient resting in bed, able to open eyes and move head yes or no. RN reports when he was extubated he had clear thick secretions that he was unable to clear as well as failing to pass trial, was reintubated. Had a BM today RN reports it was semi solid.   TF currently running at 40 ml/hr RN to titrate to goal of 65 ml/hr. Once at goal can add Juven if tolerating.    Patient is currently intubated on ventilator support MV: 12.2 L/min Temp (24hrs), Avg:97.6 F (36.4 C), Min:97.1 F (36.2 C), Max:98.2 F (36.8 C)  Admit weight: 81 kg - ? Accuracy    Current weight: 81.2 kg     Intake/Output Summary (Last 24 hours) at 05/03/2023 1525 Last data filed at 05/03/2023 0700 Gross per 24 hour  Intake 1873.83 ml  Output 875 ml  Net 998.83 ml   Net IO Since Admission: 4,605.94 mL [05/03/23 1525]  Nutritionally Relevant Medications: Scheduled Meds:  ascorbic acid  500 mg Per Tube Daily   docusate  100 mg Per Tube BID   feeding supplement (PROSource TF20)  60 mL Per Tube Daily   free water  200 mL Per Tube Q4H   insulin aspart  2-6 Units Subcutaneous Q4H   sodium chloride HYPERTONIC  4 mL Nebulization Q4H   thiamine  100 mg Per Tube Daily   zinc sulfate (50mg  elemental zinc)  220 mg Per Tube Daily   Continuous Infusions:  doxycycline (VIBRAMYCIN) IV 100 mg (05/03/23 0856)   feeding supplement (VITAL 1.5 CAL) 40 mL/hr at 05/03/23 0700   fentaNYL infusion INTRAVENOUS 50 mcg/hr (05/03/23 0700)   norepinephrine (LEVOPHED) Adult infusion Stopped (05/02/23 1930)   piperacillin-tazobactam (ZOSYN)  IV 3.375 g (05/03/23 1232)   Labs Reviewed: BUN 35, Creatinine 1.78, calcium 7.8, Phosphorus 2.2 CBG ranges from 80-167 mg/dL over the last 24 hours HgbA1c 8.6   Diet Order:   Diet Order             Diet NPO time specified  Diet effective now  EDUCATION NEEDS:   Education needs have been addressed  Skin:  Skin Assessment: Skin Integrity Issues: Skin Integrity Issues:: Incisions DTI: L heel and Buttcoks/coccyx Unstageable: R heel Incisions: Perineum  Last BM:  05/03/23  Height:   Ht Readings from Last 1 Encounters:  04/30/23 6' (1.829 m)    Weight:   Wt Readings from Last 1 Encounters:  05/03/23 81.2 kg    Ideal Body Weight:  80.9 kg  BMI:  Body mass index is 24.28 kg/m.  Estimated Nutritional Needs:   Kcal:  2300-2500 kcal  Protein:  120-140 gm  Fluid:  >2L/day   Elliot Dally, RD Registered Dietitian  See Amion for more information

## 2023-05-03 NOTE — Progress Notes (Signed)
NAME:  Vincent [name], MRN:  [id number], DOB:  26-Apr-1965, LOS: 3 ADMISSION DATE:  04/30/2023 CONSULTATION DATE:  04/30/2023 REFERRING MD:  Renaye Rakers - EDP, CHIEF COMPLAINT:  Cardiac arrest   History of Present Illness:  58 year old man who presented to [facility] ED 2/17 after OOH cardiac arrest. PMHx significant for HTN, HLD, CAD, TIA, COPD, mild OSA, T2DM (c/b gastroparesis and diabetic peripheral neuropathy), hypothyroidism, GERD, PUD/H. Pylori), bladder CA, hypogonadism (s/p bilateral orchiectomy), DDD with chronic back pain, RA, benign essential tremor, transient seizure vs. pseudoseizure, bipolar disorder, anxiety, PTSD.  Patient is intubated, therefore history is obtained from chart review and from patient's sister/caregiver (at bedside). Patient was reportedly discharged home from rehab SNF ~ 1 week PTA; tested positive for influenza at the facility and facility MD called Tamiflu in. Reportedly taking Tamiflu at home. Patient had also reported UTI symptoms to his sister and complained of not feeling well. On evening of admission, patient was found unresponsive and apneic. CPR started by sister with coaching from 911 dispatcher. Fire arrived and continued CPR; on EMS arrival initial rhythm was PEA. ROSC obtained after 15 mins of ACLS measures including Epi x 2, NS bolus, nebs and intubation.   On arrival to ER, patient had a GCS of 3 and was requiring Epi gtt. Labs were notable for WBC 37, Hgb 8.5 (near baseline), Plt 444. Na 139, K 3.3, CO2 17, Cr 1.25 (baseline 0.8-1). Glucose 509. Transaminases WNL, Alk Phos 136, Tbili 0.5. INR 1.3. Trop 13. PCT 0.42. Flu A+. BCx, UCx and Resp Cx pending. Broad-spectrum Vanc/Zosyn started. CXR demonstrated bilateral diffuse interstitial hazy airspace opacities. CT Head NAICA, C-spine without acute findings. CTA PE Protocol negative for PE, +diffuse airspace/GGO most prominent in LLL c/w PNA and trace L pleural fluid, bilateral rib fractures. CT Abd with stool ball in  rectum c/f fecal impaction, +suprapubic catheter within thick-walled bladder. Of noted, at the time of exam, patient was awake and intermittently following commands.  PCCM consulted for ICU admission.  Pertinent Medical History:  Anxiety Benign essential tremo Bipolar 2 disorder  Bladder cancer  CAD  Chronic pain syndrome, COPD  DDD Gastroparesis GERD  History of gastric ulcer History of Helicobacter pylori infection History of TIA  History of traumatic head injury (2010),  Hyperlipidemia Hypertension Hypothyroidism PTSD RA Suicide attempt by drug overdose  Type 2 diabetes mellitus   Significant Hospital Events: Including procedures, antibiotic start and stop dates in addition to other pertinent events   2/17 - Presented to [facility] via EMS post-OOH cardiac arrest. Intubated. Labs as above. CT Head NAICA, C-spine negative, CTA PE protocol negative for PE but +hazy interstitial and GGOs c/f PNA, CT Abd with stool ball/thick walled bladder with catheter in place. Vanc/Zosyn started. Epi gtt weaning over to Levo. PCCM consulted for ICU admission. 2/19 extubated midmorning, failed trial with reintubation by evening 2/20 stable on vent this a.m., tolerating ventilator.  Hemoglobin dropped to 6.8  Interim History / Subjective:  Resting comfortably on ventilator   Objective:  Blood pressure 117/63, pulse 82, temperature (!) 97.1 F (36.2 C), temperature source Axillary, resp. rate (!) 21, height 6' (1.829 m), weight 81.2 kg, SpO2 92%.    Vent Mode: PRVC FiO2 (%):  [40 %-100 %] 40 % Set Rate:  [20 bmp-26 bmp] 20 bmp Vt Set:  [409 mL] 620 mL PEEP:  [5 cmH20] 5 cmH20   Intake/Output Summary (Last 24 hours) at 05/03/2023 0802 Last data filed at 05/03/2023 0700 Gross per 24 hour  Intake 2023.83 ml  Output 875 ml  Net 1148.83 ml   Filed Weights   05/01/23 0800 05/02/23 0500 05/03/23 0500  Weight: 81 kg (P) 85.1 kg 81.2 kg   Physical Examination: General: Acute on chronic  ill-appearing deconditioned middle-aged male sitting up in bed on mechanical ventilation in no acute distress HEENT: ETT, MM pink/moist, PERRL,  Neuro: Lightly sedated on fentanyl drip, opens eyes to verbal stimuli CV: s1s2 regular rate and rhythm, no murmur, rubs, or gallops,  PULM: Bilateral rhonchi, no increased work of breathing, tolerating ventilator GI: soft, bowel sounds active in all 4 quadrants, non-tender, non-distended, tolerating TF Extremities: warm/dry, no edema  Skin: no rashes or lesions  Resolved Hospital Problem List:  Septic shock  Assessment & Plan:  PEA cardiac arrest, OOH- found down by family.   P: Continuous telemetry Optimize electrolytes Supportive care  Acute hypoxemic and hypercarbic respiratory failure post-cardiac arrest Multifocal pneumonia Influenza A infection History of COPD History of mild OSA LLL aspiration pneumonia  History of ESBL infection and MRSA  -Cx data from 01/2023 ("abscess") MDR Klebsiella pneumoniae, E. Coli and E. Faecalis. Urine Cx data may be difficult to interpret (pending organism growth) due to likely colonization and nonsterile catheter. -Failed extubation attempt 2/19 secondary to inability to clear thick secretions due to deconditioning P: Continue ventilator support with lung protective strategies  Wean PEEP and FiO2 for sats greater than 90%. Head of bed elevated 30 degrees. Plateau pressures less than 30 cm H20.  Follow intermittent chest x-ray and ABG.   SAT/SBT as tolerated, mentation preclude extubation  Ensure adequate pulmonary hygiene  Follow cultures  VAP bundle in place  PAD protocol Continue Doxy, vancomycin, and Zosyn Continue Tamiflu for extended course Chest PT Hypertonic nebs  Acute encephalopathy -Post arrest; hypercarbic resp failure> resolved Anisocoria: right pupil > left P: Neuroprotective measures Delirium precautions Avoid sedating medications as able PT/OT/SLP as able  Paroxysmal  Afib -New onset, now NSR.  No need to anticoagulate unless A-fib recurs HTN HLD CAD P: Continuous telemetry Optimize electrolytes Continue Plavix and statin  T2DM with hyperglycemia Gastroparesis P: SSI as needed CBG goal 140-180 CBG checks every 4  GERD History of PUD/ H. Pylori P Continue PPI  AKI, mild, likely ATN in the setting of sepsis/cardiac arrest Hypokalemia Hyperphosphatemia History of bladder CA Indwelling suprapubic catheter P: Continue suprapubic catheter Strict intake and output Avoid nephrotoxins Monitor urine output  Pressure wounds of bilateral heels, POA P: Pressure relieving devices Supportive care Optimize nutrition  RA DDD Chronic back pain P: Continue to hold PTA Plaquenil and Lyrica  Bipolar disorder Anxiety PTSD Transient seizures vs. pseudoseizure Essential tremor P: Continue home Latuda, Effexor, and Requip  Hx of CVA/prior TIA P: Statin and Plavix as above  Anemia -Hemoglobin has slowly down trended over the last few days with drop to 6.8 a.m. 2/20: P Received 1 unit PRBC overnight Repeat H&H Monitor for signs of bleeding Try and minimize lab draws  Best Practice: (right click and "Reselect all SmartList Selections" daily)   Diet/type: tubefeeds DVT prophylaxis: prophylactic heparin   GI prophylaxis: PPI Lines: Central line and Arterial Line Foley:  N/A - indwelling suprapubic catheter noted on admission Code Status:  full code Last date of multidisciplinary goals of care discussion [no family at bedside a.m. 2/20 update on arrival  Critical care time:  CRITICAL CARE Performed by: Harmonii Karle D. Harris   Total critical care time: 40 minutes  Critical care time was exclusive of separately billable  procedures and treating other patients.  Critical care was necessary to treat or prevent imminent or life-threatening deterioration.  Critical care was time spent personally by me on the following activities:  development of treatment plan with patient and/or surrogate as well as nursing, discussions with consultants, evaluation of patient's response to treatment, examination of patient, obtaining history from patient or surrogate, ordering and performing treatments and interventions, ordering and review of laboratory studies, ordering and review of radiographic studies, pulse oximetry and re-evaluation of patient's condition.  Travontae Freiberger D. Harris, NP-C [facility] Personal contact information can be found on Amion  If no contact or response made please call [phone] 05/03/2023, 8:41 AM

## 2023-05-03 NOTE — Progress Notes (Addendum)
Physical Therapy Treatment Patient Details Name: Vincent [name] MRN: [id number] DOB: [birth year] Today's Date: 05/03/2023   History of Present Illness 58 yo male admitted 2/17 after OOH cardiac arrest with CPR and ROSC after 15 min, bil rib fxs. Intubated 2/17-2/19 and reintubated 2/19. PMH: HTN, HLD, CAD, TIA, COPD, T2DM, hypothyroidism, GERD, bladder CA, DDD with chronic back pain, RA, benign essential tremor, bipolar disorder, anxiety, PTSD, D/C from SNF 1 week PTA.    PT Comments  Pt in bed upon arrival and agreeable to PT session. Pt required ModAx2 to sit at EOB for LE management and trunk elevation. Pt was able to stand with ModAx2 and [facility] with bilateral blocking of LE's. Pt was unable to tolerate standing >5 seconds. While seated EOB, pt intermittently required MinA to CGA for seated balance. Pt has L>R LE weakness and required increased assistance to perform exercises. Pt mentioned having difficulty seeing and would often look at the ceiling. Pt was unable to say how many people were in the room or follow a target to give a high five. Pt is progressing towards goals. Acute PT to follow.   BP 132/62, 82; 87 BPM;  96% SpO2 on PRVC- 5 PEEP 40% FiO2; ET tube at 24 cm at beginning and end of session     If plan is discharge home, recommend the following: Help with stairs or ramp for entrance;A lot of help with walking and/or transfers;A lot of help with bathing/dressing/bathroom;Assistance with cooking/housework;Assist for transportation   Can travel by private vehicle     No  Equipment Recommendations  None recommended by PT       Precautions / Restrictions Precautions Precautions: Fall Precaution/Restrictions Comments: cortrak, ET tube, suprapubic catheter, sacral wound Restrictions Weight Bearing Restrictions Per Provider Order: No     Mobility  Bed Mobility Overal bed mobility: Needs Assistance Bed Mobility: Sit to Supine, Rolling, Sidelying to Sit Rolling: Mod assist, Used  rails Sidelying to sit: Mod assist, +2 for physical assistance   Sit to supine: Mod assist, +2 for physical assistance   General bed mobility comments: ModA to roll bilaterally for pericare, ModAx2 for sidelying to sit for LE management and trunk control.    Transfers Overall transfer level: Needs assistance Equipment used: 2 person hand held assist Transfers: Sit to/from Stand Sit to Stand: Mod assist, +2 physical assistance      General transfer comment: ModAx2 for boost up, steadying, and to block B knees. Used 2 HH and bed pad to assist. Able to stand for <5 seconds    Ambulation/Gait    General Gait Details: unable      Balance Overall balance assessment: Needs assistance Sitting-balance support: Bilateral upper extremity supported, Feet supported Sitting balance-Leahy Scale: Fair Sitting balance - Comments: intermittent MinA for postural control, progresses to CGA   Standing balance support: Bilateral upper extremity supported, During functional activity, Reliant on assistive device for balance Standing balance-Leahy Scale: Poor Standing balance comment: reliant on external support       Communication Communication Communication: Impaired Factors Affecting Communication: Trach/intubated (able to shake head yes/no)  Cognition Arousal: Alert Behavior During Therapy: WFL for tasks assessed/performed   PT - Cognitive impairments: No apparent impairments     Following commands: Impaired Following commands impaired: Follows one step commands inconsistently, Follows one step commands with increased time    Cueing Cueing Techniques: Verbal cues, Tactile cues  Exercises General Exercises - Lower Extremity Long Arc Quad: AAROM, Left, Right, PROM, 5 reps, Seated (1 AAROM  on left, 4 PROM. 5 AAROM on R) Hip Flexion/Marching: AAROM, Both, 5 reps, Seated, PROM (L PROM) Other Exercises Other Exercises: seated balance for 11 min while performing exercises Other Exercises: B  shoulder flexion x5 Other Exercises: B UE reaching for hand target        Pertinent Vitals/Pain Pain Assessment Facial Expression: Relaxed, neutral Body Movements: Absence of movements Muscle Tension: Relaxed Compliance with ventilator (intubated pts.): Tolerating ventilator or movement Vocalization (extubated pts.): N/A CPOT Total: 0     PT Goals (current goals can now be found in the care plan section) Acute Rehab PT Goals PT Goal Formulation: With patient Time For Goal Achievement: 05/15/23 Potential to Achieve Goals: Good Progress towards PT goals: Progressing toward goals    Frequency    Min 1X/week          AM-PAC PT "6 Clicks" Mobility   Outcome Measure  Help needed turning from your back to your side while in a flat bed without using bedrails?: A Lot Help needed moving from lying on your back to sitting on the side of a flat bed without using bedrails?: Total Help needed moving to and from a bed to a chair (including a wheelchair)?: Total Help needed standing up from a chair using your arms (e.g., wheelchair or bedside chair)?: Total Help needed to walk in hospital room?: Total Help needed climbing 3-5 steps with a railing? : Total 6 Click Score: 7    End of Session Equipment Utilized During Treatment: Oxygen Activity Tolerance: Patient tolerated treatment well Patient left: in bed;with call bell/phone within reach Nurse Communication: Mobility status PT Visit Diagnosis: Unsteadiness on feet (R26.81);Other abnormalities of gait and mobility (R26.89);Muscle weakness (generalized) (M62.81)     Time: 8657-8469 PT Time Calculation (min) (ACUTE ONLY): 33 min  Charges:    $Therapeutic Exercise: 8-22 mins $Therapeutic Activity: 8-22 mins PT General Charges $$ ACUTE PT VISIT: 1 Visit                     Hilton Cork, PT, DPT Secure Chat Preferred  Rehab Office [phone]   Arturo Morton Brion Aliment 05/03/2023, 2:01 PM

## 2023-05-04 ENCOUNTER — Inpatient Hospital Stay (HOSPITAL_COMMUNITY): Payer: 59

## 2023-05-04 DIAGNOSIS — R1310 Dysphagia, unspecified: Secondary | ICD-10-CM

## 2023-05-04 DIAGNOSIS — G9341 Metabolic encephalopathy: Secondary | ICD-10-CM

## 2023-05-04 DIAGNOSIS — J09X1 Influenza due to identified novel influenza A virus with pneumonia: Secondary | ICD-10-CM | POA: Diagnosis not present

## 2023-05-04 DIAGNOSIS — J9601 Acute respiratory failure with hypoxia: Secondary | ICD-10-CM | POA: Diagnosis not present

## 2023-05-04 DIAGNOSIS — J690 Pneumonitis due to inhalation of food and vomit: Secondary | ICD-10-CM

## 2023-05-04 DIAGNOSIS — I469 Cardiac arrest, cause unspecified: Secondary | ICD-10-CM | POA: Diagnosis not present

## 2023-05-04 DIAGNOSIS — J9602 Acute respiratory failure with hypercapnia: Secondary | ICD-10-CM | POA: Diagnosis not present

## 2023-05-04 DIAGNOSIS — I2510 Atherosclerotic heart disease of native coronary artery without angina pectoris: Secondary | ICD-10-CM

## 2023-05-04 LAB — BPAM RBC
Blood Product Expiration Date: 202503012359
ISSUE DATE / TIME: 202502200338
Unit Type and Rh: 600

## 2023-05-04 LAB — BASIC METABOLIC PANEL
Anion gap: 12 (ref 5–15)
BUN: 37 mg/dL — ABNORMAL HIGH (ref 6–20)
CO2: 20 mmol/L — ABNORMAL LOW (ref 22–32)
Calcium: 8 mg/dL — ABNORMAL LOW (ref 8.9–10.3)
Chloride: 109 mmol/L (ref 98–111)
Creatinine, Ser: 1.45 mg/dL — ABNORMAL HIGH (ref 0.61–1.24)
GFR, Estimated: 57 mL/min — ABNORMAL LOW (ref >60)
Glucose, Bld: 189 mg/dL — ABNORMAL HIGH (ref 70–99)
Potassium: 3.6 mmol/L (ref 3.5–5.1)
Sodium: 141 mmol/L (ref 135–145)

## 2023-05-04 LAB — TYPE AND SCREEN
ABO/RH(D): A NEG
Antibody Screen: NEGATIVE
Unit division: 0

## 2023-05-04 LAB — CBC
HCT: 22.2 % — ABNORMAL LOW (ref 39.0–52.0)
Hemoglobin: 7.3 g/dL — ABNORMAL LOW (ref 13.0–17.0)
MCH: 26.4 pg (ref 26.0–34.0)
MCHC: 32.9 g/dL (ref 30.0–36.0)
MCV: 80.1 fL (ref 80.0–100.0)
Platelets: 184 10*3/uL (ref 150–400)
RBC: 2.77 MIL/uL — ABNORMAL LOW (ref 4.22–5.81)
RDW: 15 % (ref 11.5–15.5)
WBC: 17.3 10*3/uL — ABNORMAL HIGH (ref 4.0–10.5)
nRBC: 0 % (ref 0.0–0.2)

## 2023-05-04 LAB — PHOSPHORUS: Phosphorus: 4.7 mg/dL — ABNORMAL HIGH (ref 2.5–4.6)

## 2023-05-04 LAB — GLUCOSE, CAPILLARY
Glucose-Capillary: 159 mg/dL — ABNORMAL HIGH (ref 70–99)
Glucose-Capillary: 177 mg/dL — ABNORMAL HIGH (ref 70–99)
Glucose-Capillary: 201 mg/dL — ABNORMAL HIGH (ref 70–99)
Glucose-Capillary: 223 mg/dL — ABNORMAL HIGH (ref 70–99)
Glucose-Capillary: 203 mg/dL — ABNORMAL HIGH (ref 70–99)
Glucose-Capillary: 241 mg/dL — ABNORMAL HIGH (ref 70–99)

## 2023-05-04 LAB — TRIGLYCERIDES: Triglycerides: 84 mg/dL (ref <150)

## 2023-05-04 LAB — MAGNESIUM: Magnesium: 1.9 mg/dL (ref 1.7–2.4)

## 2023-05-04 MED ORDER — FUROSEMIDE 10 MG/ML IJ SOLN
80.0000 mg | Freq: Three times a day (TID) | INTRAMUSCULAR | Status: AC
  Administered 2023-05-04 – 2023-05-05 (×3): 80 mg via INTRAVENOUS
  Filled 2023-05-04 (×3): qty 8

## 2023-05-04 MED ORDER — OSELTAMIVIR PHOSPHATE 75 MG PO CAPS
75.0000 mg | ORAL_CAPSULE | Freq: Two times a day (BID) | ORAL | Status: DC
  Administered 2023-05-04 – 2023-05-06 (×5): 75 mg
  Filled 2023-05-04 (×5): qty 1

## 2023-05-04 MED ORDER — POTASSIUM CHLORIDE 20 MEQ PO PACK
40.0000 meq | PACK | Freq: Once | ORAL | Status: AC
  Administered 2023-05-04: 40 meq
  Filled 2023-05-04: qty 2

## 2023-05-04 MED ORDER — PREGABALIN 100 MG PO CAPS
200.0000 mg | ORAL_CAPSULE | Freq: Three times a day (TID) | ORAL | Status: DC
  Administered 2023-05-04 – 2023-05-06 (×6): 200 mg
  Filled 2023-05-04 (×6): qty 2

## 2023-05-04 MED ORDER — PREGABALIN 75 MG PO CAPS: 300.0000 mg | ORAL_CAPSULE | Freq: Three times a day (TID) | ORAL | Status: DC

## 2023-05-04 MED ORDER — POTASSIUM CHLORIDE 20 MEQ PO PACK
40.0000 meq | PACK | Freq: Four times a day (QID) | ORAL | Status: AC
  Administered 2023-05-04 (×2): 40 meq
  Filled 2023-05-04 (×2): qty 2

## 2023-05-04 MED ORDER — MAGNESIUM SULFATE 2 GM/50ML IV SOLN
2.0000 g | Freq: Once | INTRAVENOUS | Status: AC
  Administered 2023-05-04: 2 g via INTRAVENOUS
  Filled 2023-05-04: qty 50

## 2023-05-04 NOTE — Progress Notes (Signed)
NAME:  Vincent [name], MRN:  [id number], DOB:  May 19, 1965, LOS: 4 ADMISSION DATE:  04/30/2023 CONSULTATION DATE:  04/30/2023 REFERRING MD:  Renaye Rakers - EDP, CHIEF COMPLAINT:  Cardiac arrest   History of Present Illness:  58 year old man who presented to [facility] ED 2/17 after OOH cardiac arrest. PMHx significant for HTN, HLD, CAD, TIA, COPD, mild OSA, T2DM (c/b gastroparesis and diabetic peripheral neuropathy), hypothyroidism, GERD, PUD/H. Pylori), bladder CA, hypogonadism (s/p bilateral orchiectomy), DDD with chronic back pain, RA, benign essential tremor, transient seizure vs. pseudoseizure, bipolar disorder, anxiety, PTSD. -Discharged home from rehab SNF ~ 1 week PTA; tested positive for influenza at the facility and facility MD called Tamiflu in. Reportedly taking Tamiflu at home. Patient had also reported UTI symptoms to his sister and complained of not feeling well. On evening of admission, patient was found unresponsive and apneic. CPR started by sister with coaching from 911 dispatcher. Fire arrived and continued CPR; on EMS arrival initial rhythm was PEA. ROSC obtained after 15 mins of ACLS measures including Epi x 2, NS bolus, nebs and intubation.   On arrival to ER, patient had a GCS of 3 and was requiring Epi gtt. Labs were notable for WBC 37, Hgb 8.5 (near baseline), Plt 444. Na 139, K 3.3, CO2 17, Cr 1.25 (baseline 0.8-1). Glucose 509. Transaminases WNL, Alk Phos 136, Tbili 0.5. INR 1.3. Trop 13. PCT 0.42. Flu A+. BCx, UCx and Resp Cx pending. Broad-spectrum Vanc/Zosyn started. CXR demonstrated bilateral diffuse interstitial hazy airspace opacities. CT Head NAICA, C-spine without acute findings. CTA PE Protocol negative for PE, +diffuse airspace/GGO most prominent in LLL c/w PNA and trace L pleural fluid, bilateral rib fractures. CT Abd with stool ball in rectum c/f fecal impaction, +suprapubic catheter within thick-walled bladder. Of noted, at the time of exam, patient was awake and intermittently  following commands.  PCCM consulted for ICU admission.  Pertinent Medical History:  Anxiety Benign essential tremo Bipolar 2 disorder  Bladder cancer  CAD  Chronic pain syndrome, COPD  DDD Gastroparesis GERD  History of gastric ulcer History of Helicobacter pylori infection History of TIA  History of traumatic head injury (2010),  Hyperlipidemia Hypertension Hypothyroidism PTSD RA Suicide attempt by drug overdose  Type 2 diabetes mellitus  History of ESBL infection and MRSA   Significant Hospital Events: Including procedures, antibiotic start and stop dates in addition to other pertinent events   2/17 - Presented to [facility] via EMS post-OOH cardiac arrest. Intubated. Labs as above. CT Head NAICA, C-spine negative, CTA PE protocol negative for PE but +hazy interstitial and GGOs c/f PNA, CT Abd with stool ball/thick walled bladder with catheter in place. Vanc/Zosyn started. Epi gtt weaning over to Levo. PCCM consulted for ICU admission. 2/19 extubated midmorning, failed trial with reintubation by evening 2/20 stable on vent this a.m., tolerating ventilator.  Hemoglobin dropped to 6.8, HT nebs started.   Interim History / Subjective:  Awake no distress  Objective:  Blood pressure (!) 111/56, pulse 80, temperature 98.8 F (37.1 C), temperature source Oral, resp. rate 20, height 6' (1.829 m), weight 81.2 kg, SpO2 96%.    Vent Mode: PRVC FiO2 (%):  [40 %] 40 % Set Rate:  [20 bmp] 20 bmp Vt Set:  [620 mL] 620 mL PEEP:  [5 cmH20] 5 cmH20 Plateau Pressure:  [18 cmH20-21 cmH20] 21 cmH20   Intake/Output Summary (Last 24 hours) at 05/04/2023 0950 Last data filed at 05/04/2023 0913 Gross per 24 hour  Intake 2616.06 ml  Output  800 ml  Net 1816.06 ml   Filed Weights   05/01/23 0800 05/02/23 0500 05/03/23 0500  Weight: 81 kg (P) 85.1 kg 81.2 kg   Physical Examination: General chronically ill appearing 58 year old male currently on full vent support HENT NCAT orally intubated. +  temporal wasting Pulm coarse diffuse bilateral rhonchi. No accessory use Portable chest x-ray Recently retrieved from the 19th shows endotracheal tube in satisfactory position, right IJ triple-lumen catheter in satisfactory position, patchy bilateral airspace disease with left lower lobe consolidation/volume loss Card rrr Abd soft Ext warm dependent edema Neuro awake, generalized weakness. No focal def f/c interactive  Gu SP cath  Resolved Hospital Problem List:  Septic shock PEA cardiac arrest  OOH (admitting dx)  Assessment & Plan:    Acute hypoxemic and hypercarbic respiratory failure post-cardiac arrest complicated further by influenza A and aspiration PNA involving left lower lobe, growing rare Klebsiella Aerogenes and rare group B strep History of COPD History of mild OSA -Failed extubation attempt 2/19 secondary to inability to clear thick secretions due to deconditioning Plan  Continuing full ventilator support w/ daily assessment for weaning Daily assessment for SBT  Continuing pulmonary hygiene with inhaled hyper tonic saline  Scheduled bronchodilators  Continue Zosyn as we await sensitivities from pending cultures Discontinue doxycycline, vancomycin already stopped Discontinue doxycycline follow cultures  Complete Tamiflu Lasix ordered.  VAP bundle RASS goal 0 to -1 Chest x-ray today Will need to decide over weekend about plan of extubated and fails (re-intubate/trach vs comfort)   Acute metabolic encephalopathy s/p cardiac arrest superimposed on Bipolar disorder, Anxiety, PTSD Transient seizures vs. Pseudoseizure and Hx of CVA/prior TIA Essential tremor, he is WC bound at baseline  Anisocoria: right pupil > left Plan Continue home Latuda and Requip Effexor currently changed to short acting so that he can get VIA FT Cont Statin and Plavix as above Neuroprotective measures Delirium precautions Avoid sedating medications as able PT/OT/SLP as able Resume lyrica    AKI, mild, likely ATN in the setting of sepsis/cardiac arrest History of bladder CA Chronic Indwelling suprapubic catheter Renal fxn improving as is acid base  plan Continue suprapubic catheter Strict intake and output Avoid nephrotoxins Monitor urine output Am chem   Paroxysmal Afib, HTN, HLD, CAD -New onset, now NSR.  No need to anticoagulate unless A-fib recurs Plan Continuous telemetry Optimize electrolytes Continue Plavix and statin  T2DM with hyperglycemia Plan SSI  CBG goal 140-180   GERD, Diabetic Gastroparesis History of PUD/ H. Pylori plan Continue PPI Reflux precautions   Anemia -Hemoglobin has slowly down trended over the last few days with drop to 6.8 a.m. 2/20: got 1 unit  Plan Trend CBC transfusion trigger for hemoglobin less than 7 Cont Amity heparin for now   Pressure wounds of bilateral heels, POA Plan Pressure relieving devices Supportive care Optimize nutrition  RA DDD Chronic back pain Plan Continue to hold PTA Plaquenil     Best Practice: (right click and "Reselect all SmartList Selections" daily)   Diet/type: tubefeeds DVT prophylaxis: prophylactic heparin   GI prophylaxis: PPI Lines: Central line and Arterial Line Foley:  N/A - indwelling suprapubic catheter noted on admission Code Status:  full code Last date of multidisciplinary goals of care discussion [no family at bedside a.m. 2/20 update on arrival  Critical care time: 35 min   CRITICAL CARE Performed by: Shelby Mattocks

## 2023-05-04 NOTE — Progress Notes (Signed)
[facility] ADULT ICU REPLACEMENT PROTOCOL   The patient does apply for the [facility] Adult ICU Electrolyte Replacment Protocol based on the criteria listed below:   1.Exclusion criteria: TCTS, ECMO, Dialysis, and Myasthenia Gravis patients 2. Is GFR >/= 30 ml/min? Yes.    Patient's GFR today is 57 3. Is SCr </= 2? Yes.   Patient's SCr is 1.45 mg/dL 4. Did SCr increase >/= 0.5 in 24 hours? No. 5.Pt's weight >40kg  Yes.   6. Abnormal electrolyte(s): Magnesium, Potassium  7. Electrolytes replaced per protocol 8.  Call MD STAT for K+ </= 2.5, Phos </= 1, or Mag </= 1 Physician:  Dr. Namon Cirri A Calandra Madura 05/04/2023 5:55 AM

## 2023-05-04 NOTE — Progress Notes (Addendum)
Occupational Therapy Treatment Patient Details Name: Vincent [name] MRN: [id number] DOB: 1965-07-09 Today's Date: 05/04/2023   History of present illness 58 yo male admitted 2/17 after OOH cardiac arrest with CPR and ROSC after 15 min, bil rib fxs. Intubated 2/17-2/19 and reintubated 2/19. PMH: HTN, HLD, CAD, TIA, COPD, T2DM, hypothyroidism, GERD, bladder CA, DDD with chronic back pain, RA, benign essential tremor, bipolar disorder, anxiety, PTSD, D/C from SNF 1 week PTA.   OT comments  Pt making incremental progress towards OT goals. Pt alert, able to follow one step commands consistently and eager to mobilize. RT present at start of session to further secure ETT prior to mobilization efforts. Pt required Mod A x 2 for bed mobility w/ initial assist needed to maintain sitting balance. Pt able to briefly stand with Mod A x 2 but quickly fatigued. Based on current medical complexities and deficits, recommend consideration of continued rehab in an [facility] setting.   VSS - PRVC 40% FiO2, PEEP 5      If plan is discharge home, recommend the following:  Two people to help with walking and/or transfers;A lot of help with bathing/dressing/bathroom;Assistance with cooking/housework;Assistance with feeding;Direct supervision/assist for medications management;Direct supervision/assist for financial management;Assist for transportation;Help with stairs or ramp for entrance;Supervision due to cognitive status   Equipment Recommendations  Other (comment) (TBD)    Recommendations for Other Services      Precautions / Restrictions Precautions Precautions: Fall Precaution/Restrictions Comments: cortrak, ET tube, suprapubic catheter, sacral wound Restrictions Weight Bearing Restrictions Per Provider Order: No       Mobility Bed Mobility Overal bed mobility: Needs Assistance Bed Mobility: Supine to Sit, Sit to Supine     Supine to sit: Mod assist, +2 for safety/equipment, HOB elevated Sit to supine:  Mod assist, +2 for physical assistance, +2 for safety/equipment   General bed mobility comments: tactile cues to bring LE to EOB with light assist, handheld assist to lift trunk with pt appropriately pulling - assist to steady trunk EOB and BLE assist needed to return to supine w/ RN present to also assist with lines    Transfers Overall transfer level: Needs assistance Equipment used: 2 person hand held assist Transfers: Sit to/from Stand Sit to Stand: Mod assist, +2 physical assistance           General transfer comment: Pt wanted to stand at bedside - difficulty holding therapist's arms as cued. bed pad used to lift bottom with pt difficulty standing fully up. use of bed pad to shift hips towards HOB before returning to supine     Balance Overall balance assessment: Needs assistance Sitting-balance support: Bilateral upper extremity supported, Feet supported Sitting balance-Leahy Scale: Fair     Standing balance support: Bilateral upper extremity supported, During functional activity, Reliant on assistive device for balance Standing balance-Leahy Scale: Poor                             ADL either performed or assessed with clinical judgement   ADL Overall ADL's : Needs assistance/impaired                     Lower Body Dressing: Total assistance;Bed level Lower Body Dressing Details (indicate cue type and reason): sock mgmt               General ADL Comments: Hoped to work on EOB ADLs though required RT assist at bedside to secure ETT tube before  EOB attempts so time constrained for EOB ther activities during this session    Extremity/Trunk Assessment Upper Extremity Assessment Upper Extremity Assessment: Right hand dominant;RUE deficits/detail;LUE deficits/detail RUE Deficits / Details: generalized weakness; decreased coordination; decreased proprioception; edematous throughout UE RUE Coordination: decreased fine motor;decreased gross motor LUE  Deficits / Details: generalized weakness; missing 5th digit of L hand with area completely healed and not affecting functional use; decreased coordination; decreased proprioception; edematous throughout UE LUE Coordination: decreased fine motor;decreased gross motor   Lower Extremity Assessment Lower Extremity Assessment: Defer to PT evaluation        Vision   Vision Assessment?: No apparent visual deficits   Perception     Praxis     Communication Communication Communication: Impaired Factors Affecting Communication: Trach/intubated   Cognition Arousal: Alert Behavior During Therapy: WFL for tasks assessed/performed Cognition: Cognition impaired     Awareness: Intellectual awareness intact, Online awareness impaired Memory impairment (select all impairments): Working memory Attention impairment (select first level of impairment): Sustained attention Executive functioning impairment (select all impairments): Organization, Sequencing, Reasoning, Problem solving OT - Cognition Comments: follows one step directions consistently, able to sequence tasks appropriately with cues though did need cues for safety awareness due to attempting to scoot/stand without assist                 Following commands: Impaired Following commands impaired: Follows one step commands with increased time, Follows multi-step commands with increased time      Cueing   Cueing Techniques: Verbal cues, Tactile cues  Exercises Exercises: Other exercises Other Exercises Other Exercises: lateral truncal rotations x 2 EOB    Shoulder Instructions       General Comments RN and 2 daughters present    Pertinent Vitals/ Pain       Pain Assessment Pain Assessment: Faces Faces Pain Scale: No hurt Pain Intervention(s): Monitored during session  Home Living                                          Prior Functioning/Environment              Frequency  Min 1X/week         Progress Toward Goals  OT Goals(current goals can now be found in the care plan section)  Progress towards OT goals: Progressing toward goals  Acute Rehab OT Goals Patient Stated Goal: wanted to stand today OT Goal Formulation: With patient Time For Goal Achievement: 05/16/23 Potential to Achieve Goals: Good ADL Goals Pt Will Perform Grooming: with supervision;sitting Pt Will Perform Upper Body Bathing: with min assist;sitting Pt Will Perform Lower Body Bathing: sitting/lateral leans;sit to/from stand;with mod assist Pt Will Perform Upper Body Dressing: with min assist;sitting Pt Will Perform Lower Body Dressing: with mod assist;sit to/from stand;sitting/lateral leans Pt Will Transfer to Toilet: with min assist;ambulating;bedside commode Pt Will Perform Toileting - Clothing Manipulation and hygiene: with mod assist;sit to/from stand;sitting/lateral leans  Plan      Co-evaluation                 AM-PAC OT "6 Clicks" Daily Activity     Outcome Measure   Help from another person eating meals?: Total Help from another person taking care of personal grooming?: A Lot Help from another person toileting, which includes using toliet, bedpan, or urinal?: Total Help from another person bathing (including washing, rinsing, drying)?: A Lot  Help from another person to put on and taking off regular upper body clothing?: A Lot Help from another person to put on and taking off regular lower body clothing?: Total 6 Click Score: 9    End of Session Equipment Utilized During Treatment: Other (comment) (vent)  OT Visit Diagnosis: Unsteadiness on feet (R26.81);Other abnormalities of gait and mobility (R26.89);Muscle weakness (generalized) (M62.81);Ataxia, unspecified (R27.0);Other symptoms and signs involving cognitive function;Other (comment)   Activity Tolerance Patient tolerated treatment well;Patient limited by fatigue   Patient Left in bed;with call bell/phone within  reach;with family/visitor present;Other (comment) (mitts reapplied)   Nurse Communication Mobility status        Time: 1610-9604 OT Time Calculation (min): 26 min  Charges: OT General Charges $OT Visit: 1 Visit OT Treatments $Therapeutic Activity: 23-37 mins  Bradd Canary, OTR/L Acute Rehab Services Office: [phone]   Lorre Munroe 05/04/2023, 12:06 PM

## 2023-05-04 NOTE — Plan of Care (Signed)
  Problem: Safety: Goal: Non-violent Restraint(s) Outcome: Progressing   

## 2023-05-04 NOTE — IPAL (Signed)
  Interdisciplinary Goals of Care Family Meeting   Date carried out:: 05/04/2023  Location of the meeting: Unit  Member's involved: Physician, Bedside Registered Nurse, and Family Member or next of kin  Durable Power of Attorney or acting medical decision maker: 2 daughters    Discussion: We discussed goals of care for Vincent [name] .  I met with both of Vincent [name]'s daughters to discuss his care up to this point and his expected prognosis. They understand the uncertainty at this point whether he will be able to manage secretions and effectively cough after extubation-- his arrest was due to mucus plugging and that was how he failed his first extubation attempt within about 6 hrs. He is baseline very frail and has had multiple previous infections and hospitalizations. Family understands that if he fails extubation again, we would have to decide comfort focused care vs aggressive care with reintubation & tracheostomy. With his baseline poor strength and coordination and poor cough mechanics, he is above average risk of requiring tracheostomy permanently, and he may require SNF long-term to meet his care needs. His daughters feel like he was barely making it at home with his sister before this, and she was working most of the day. He and his former wife were married about 25 years and were divorced about a year ago; as far as his daughters go he has not had his advanced directive changed to appoint anyone else as his decision maker, meaning the document may still stand. If not, NOK would be adult children (both confirmed they are >70 y/o) before his sisters. We discussed multiple long-term care options and that we will have to see how he does moving forward to know more. Palliative care consulted to help with GOC discussions, family support, and to help get the family on the same page.  Their mother (patient's former wife) to come to visit this weekend and we hopefully can have a full family meeting at  some point this weekend.  Code status: Full Code- code status not discussed today  Disposition: Continue current acute care   Time spent for the meeting: 50 min.  Vincent [name] 05/04/2023, 3:29 PM

## 2023-05-05 DIAGNOSIS — J9602 Acute respiratory failure with hypercapnia: Secondary | ICD-10-CM | POA: Diagnosis not present

## 2023-05-05 DIAGNOSIS — Z515 Encounter for palliative care: Secondary | ICD-10-CM

## 2023-05-05 DIAGNOSIS — I469 Cardiac arrest, cause unspecified: Secondary | ICD-10-CM | POA: Diagnosis not present

## 2023-05-05 DIAGNOSIS — J09X1 Influenza due to identified novel influenza A virus with pneumonia: Secondary | ICD-10-CM | POA: Diagnosis not present

## 2023-05-05 DIAGNOSIS — Z7189 Other specified counseling: Secondary | ICD-10-CM | POA: Diagnosis not present

## 2023-05-05 DIAGNOSIS — J9601 Acute respiratory failure with hypoxia: Secondary | ICD-10-CM | POA: Diagnosis not present

## 2023-05-05 LAB — BASIC METABOLIC PANEL
Anion gap: 10 (ref 5–15)
BUN: 37 mg/dL — ABNORMAL HIGH (ref 6–20)
CO2: 22 mmol/L (ref 22–32)
Calcium: 7.6 mg/dL — ABNORMAL LOW (ref 8.9–10.3)
Chloride: 107 mmol/L (ref 98–111)
Creatinine, Ser: 1.3 mg/dL — ABNORMAL HIGH (ref 0.61–1.24)
GFR, Estimated: >60 mL/min (ref >60)
Glucose, Bld: 256 mg/dL — ABNORMAL HIGH (ref 70–99)
Potassium: 4.2 mmol/L (ref 3.5–5.1)
Sodium: 139 mmol/L (ref 135–145)

## 2023-05-05 LAB — CULTURE, BLOOD (ROUTINE X 2)
Culture: NO GROWTH
Culture: NO GROWTH

## 2023-05-05 LAB — GLUCOSE, CAPILLARY
Glucose-Capillary: 243 mg/dL — ABNORMAL HIGH (ref 70–99)
Glucose-Capillary: 214 mg/dL — ABNORMAL HIGH (ref 70–99)
Glucose-Capillary: 211 mg/dL — ABNORMAL HIGH (ref 70–99)
Glucose-Capillary: 193 mg/dL — ABNORMAL HIGH (ref 70–99)
Glucose-Capillary: 222 mg/dL — ABNORMAL HIGH (ref 70–99)
Glucose-Capillary: 305 mg/dL — ABNORMAL HIGH (ref 70–99)

## 2023-05-05 LAB — CBC
HCT: 22.8 % — ABNORMAL LOW (ref 39.0–52.0)
Hemoglobin: 7.2 g/dL — ABNORMAL LOW (ref 13.0–17.0)
MCH: 25.9 pg — ABNORMAL LOW (ref 26.0–34.0)
MCHC: 31.6 g/dL (ref 30.0–36.0)
MCV: 82 fL (ref 80.0–100.0)
Platelets: 202 10*3/uL (ref 150–400)
RBC: 2.78 MIL/uL — ABNORMAL LOW (ref 4.22–5.81)
RDW: 15.4 % (ref 11.5–15.5)
WBC: 15 10*3/uL — ABNORMAL HIGH (ref 4.0–10.5)
nRBC: 0 % (ref 0.0–0.2)

## 2023-05-05 LAB — CULTURE, RESPIRATORY W GRAM STAIN

## 2023-05-05 LAB — LEGIONELLA PNEUMOPHILA SEROGP 1 UR AG: L. pneumophila Serogp 1 Ur Ag: NEGATIVE

## 2023-05-05 LAB — PHOSPHORUS: Phosphorus: 3.9 mg/dL (ref 2.5–4.6)

## 2023-05-05 LAB — MAGNESIUM: Magnesium: 1.9 mg/dL (ref 1.7–2.4)

## 2023-05-05 MED ORDER — INSULIN ASPART 100 UNIT/ML IJ SOLN
5.0000 [IU] | Freq: Four times a day (QID) | INTRAMUSCULAR | Status: DC
  Administered 2023-05-05 (×2): 5 [IU] via SUBCUTANEOUS

## 2023-05-05 MED ORDER — INSULIN ASPART 100 UNIT/ML IJ SOLN
0.0000 [IU] | INTRAMUSCULAR | Status: DC
  Administered 2023-05-05: 2 [IU] via SUBCUTANEOUS
  Administered 2023-05-05 (×2): 3 [IU] via SUBCUTANEOUS
  Administered 2023-05-05: 7 [IU] via SUBCUTANEOUS
  Administered 2023-05-05 (×2): 3 [IU] via SUBCUTANEOUS
  Administered 2023-05-06 (×3): 5 [IU] via SUBCUTANEOUS

## 2023-05-05 MED ORDER — INSULIN ASPART 100 UNIT/ML IJ SOLN
5.0000 [IU] | INTRAMUSCULAR | Status: DC
  Administered 2023-05-05 – 2023-05-06 (×5): 5 [IU] via SUBCUTANEOUS

## 2023-05-05 NOTE — Progress Notes (Signed)
 NAME:  YOLANDA DOCKENDORF, MRN:  [id number], DOB:  Nov 27, 1965, LOS: 5 ADMISSION DATE:  04/30/2023 CONSULTATION DATE:  04/30/2023 REFERRING MD:  Renaye Rakers - EDP, CHIEF COMPLAINT:  Cardiac arrest   History of Present Illness:  58 year old man who presented to [facility] ED 2/17 after OOH cardiac arrest. PMHx significant for HTN, HLD, CAD, TIA, COPD, mild OSA, T2DM (c/b gastroparesis and diabetic peripheral neuropathy), hypothyroidism, GERD, PUD/H. Pylori), bladder CA, hypogonadism (s/p bilateral orchiectomy), DDD with chronic back pain, RA, benign essential tremor, transient seizure vs. pseudoseizure, bipolar disorder, anxiety, PTSD. -Discharged home from rehab SNF ~ 1 week PTA; tested positive for influenza at the facility and facility MD called Tamiflu in. Reportedly taking Tamiflu at home. Patient had also reported UTI symptoms to his sister and complained of not feeling well. On evening of admission, patient was found unresponsive and apneic. CPR started by sister with coaching from 911 dispatcher. Fire arrived and continued CPR; on EMS arrival initial rhythm was PEA. ROSC obtained after 15 mins of ACLS measures including Epi x 2, NS bolus, nebs and intubation.   On arrival to ER, patient had a GCS of 3 and was requiring Epi gtt. Labs were notable for WBC 37, Hgb 8.5 (near baseline), Plt 444. Na 139, K 3.3, CO2 17, Cr 1.25 (baseline 0.8-1). Glucose 509. Transaminases WNL, Alk Phos 136, Tbili 0.5. INR 1.3. Trop 13. PCT 0.42. Flu A+. BCx, UCx and Resp Cx pending. Broad-spectrum Vanc/Zosyn started. CXR demonstrated bilateral diffuse interstitial hazy airspace opacities. CT Head NAICA, C-spine without acute findings. CTA PE Protocol negative for PE, +diffuse airspace/GGO most prominent in LLL c/w PNA and trace L pleural fluid, bilateral rib fractures. CT Abd with stool ball in rectum c/f fecal impaction, +suprapubic catheter within thick-walled bladder. Of noted, at the time of exam, patient was awake and intermittently  following commands.  PCCM consulted for ICU admission.  Pertinent Medical History:  Anxiety Benign essential tremo Bipolar 2 disorder  Bladder cancer  CAD  Chronic pain syndrome, COPD  DDD Gastroparesis GERD  History of gastric ulcer History of Helicobacter pylori infection History of TIA  History of traumatic head injury (2010),  Hyperlipidemia Hypertension Hypothyroidism PTSD RA Suicide attempt by drug overdose  Type 2 diabetes mellitus  History of ESBL infection and MRSA   Significant Hospital Events: Including procedures, antibiotic start and stop dates in addition to other pertinent events   2/17 - Presented to [facility] via EMS post-OOH cardiac arrest. Intubated. Labs as above. CT Head NAICA, C-spine negative, CTA PE protocol negative for PE but +hazy interstitial and GGOs c/f PNA, CT Abd with stool ball/thick walled bladder with catheter in place. Vanc/Zosyn started. Epi gtt weaning over to Levo. PCCM consulted for ICU admission. 2/19 extubated midmorning, failed trial with reintubation by evening 2/20 stable on vent this a.m., tolerating ventilator.  Hemoglobin dropped to 6.8, HT nebs started.   Interim History / Subjective:  More lethargic today, on low dose fentanyl.  Objective:  Blood pressure (!) 134/57, pulse 81, temperature 99.7 F (37.6 C), temperature source Axillary, resp. rate 20, height 6' (1.829 m), weight 81.4 kg, SpO2 98%.    Vent Mode: PRVC FiO2 (%):  [40 %] 40 % Set Rate:  [20 bmp] 20 bmp Vt Set:  [620 mL] 620 mL PEEP:  [5 cmH20] 5 cmH20 Pressure Support:  [8 cmH20] 8 cmH20 Plateau Pressure:  [19 cmH20-20 cmH20] 19 cmH20   Intake/Output Summary (Last 24 hours) at 05/05/2023 1315 Last data filed at 05/05/2023  1300 Gross per 24 hour  Intake 1970.91 ml  Output 4625 ml  Net -2654.09 ml   Filed Weights   05/02/23 0500 05/03/23 0500 05/05/23 0500  Weight: (P) 85.1 kg 81.2 kg 81.4 kg   Physical Examination: General critically ill appearing man  lying in bed in NAD HENT  [place]/AT, eyes anicteric, ETT  Pulm No rhonchi, moderate secretions when suctioned. Weak cough. Card: S1S2, RRR Abd soft, NT Ext  mild edema, no cyanosis Neuro RASS -1, globally very weak, not moving legs on command. Gu suprapubic catheter in place  BUN 37 Cr 1.3 WBC 15 H/H 7.2/22.8 Platelets 202 BG 180-250s   Resolved Hospital Problem List:  Septic shock PEA cardiac arrest  OOH (admitting dx)  Acute metabolic encephalopathy s/p cardiac arrest, resolved  Assessment & Plan:   Acute hypoxemic and hypercarbic respiratory failure post-cardiac arrest complicated further by influenza A  Aspiration PNA involving left lower lobe, growing rare Klebsiella Aerogenes and rare group B strep History of COPD History of mild OSA -Failed extubation attempt 2/19 secondary to inability to clear secretions due to NM weakness, poor cough mechanics -LTVV -VAP prevention protocol -PAD protocol for sedation -con't zosyn for pneumonia -Daily SAT & SBT; planning for terminal extubation focusing on comfort on 2/23. Family are in agreement that life with a trach and SNF to support that is not likely what he would want.   Chest wall pain from chest compressions -lidocaine patch -fentanyl PRN per PAD  Bipolar disorder, Anxiety, PTSD Transient seizures vs. Pseudoseizure and Hx of CVA/prior TIA Essential tremor, he is nearly WC bound at baseline  Severe peripheral neuropathy from poorly controlled DM long-term Anisocoria: right pupil > left -con't PTA requip, latuda, effexor, Lyrica -PAD protocol -GOC discussions  AKI, mild, likely ATN in the setting of sepsis/cardiac arrest History of bladder CA Chronic indwelling suprapubic catheter, neurogenic bladder -strict I/O -renally dose meds, avoid nephrotoxic meds -con't PTA suprapubic catheter  Paroxysmal Afib- new onset with acute illness HTN HLD  CAD -aspirin, statin; had been holding plavix due to concern for need for  trach down the line -not requiring rate control meds -tele monitoring  T2DM with hyperglycemia -SSI PRN -TF coverage 5 units q4h -goal BG 140-180   GERD, Diabetic Gastroparesis History of PUD/ H. Pylori -PPI  Anemia -transfuse for Hb <7 or hemodynamically significant bleeding  Pressure wounds of bilateral heels, POA -wound care, offload  RA DDD Chronic back pain -holding PTA plaquenil   See ipal note. Planning for terminal extubation tomorrow with comfort care.   Best Practice: (right click and "Reselect all SmartList Selections" daily)   Diet/type: tubefeeds DVT prophylaxis: prophylactic heparin   GI prophylaxis: PPI Lines: Central line and Arterial Line Foley:  N/A - indwelling suprapubic catheter noted on admission Code Status:  full code Last date of multidisciplinary goals of care discussion [2/22  Critical care time:    This patient is critically ill with multiple organ system failure which requires frequent high complexity decision making, assessment, support, evaluation, and titration of therapies. This was completed through the application of advanced monitoring technologies and extensive interpretation of multiple databases. During this encounter critical care time was devoted to patient care services described in this note for 65 minutes.  Steffanie Dunn, DO 05/05/23 2:30 PM [facility]  For contact information, see Amion. If no response to pager, please call PCCM consult pager. After hours, 7PM- 7AM, please call Elink.

## 2023-05-05 NOTE — Consult Note (Signed)
 Palliative Care Consult Note                                  Date: 05/05/2023   Patient Name: Vincent [name]  DOB: 1965/08/04  MRN: [id number]  Age / Sex: 58 y.o., male  PCP: Dois Davenport, MD Referring Physician: Steffanie Dunn, DO  Reason for Consultation: Establishing goals of care  HPI/Patient Profile: 58 y.o. male  with past medical history of HTN, HLD, CAD, TIA, COPD, mild OSA, T2DM (c/b gastroparesis and diabetic peripheral neuropathy), hypothyroidism, GERD, PUD/H. Pylori), bladder CA, hypogonadism (s/p bilateral orchiectomy), DDD with chronic back pain, RA, benign essential tremor, transient seizure vs. pseudoseizure, bipolar disorder, anxiety, PTSD admitted on 04/30/2023 after OOH cardiac arrest.   Past Medical History:  Diagnosis Date   Anxiety    Benign essential tremor    Bipolar 2 disorder (HCC)    followed by [facility]--- dr s. Lolly Mustache   Bladder cancer [facility])    recurrent   CAD (coronary artery disease)    cardiac cath 2003  and 2011 both showed normal coronary arteries w/ preserved lvf;  Non obstructive on CTA Oct 2019.    Chronic pain syndrome    back---- [facility] [place]   Cold extremities    BLE   COPD (chronic obstructive pulmonary disease) (HCC)    DDD (degenerative disc disease), lumbar    Diabetic peripheral neuropathy (HCC)    Gastroparesis    followed by dr Marina Goodell   GERD (gastroesophageal reflux disease)    Hiatal hernia    History of bladder cancer urologist-  previously dr Ronal Fear;  now dr gay   papillay TCC (Ta G1)  s/p TURBT and chemo instillation 2014   History of chest pain 12/2017   heart cath normal   History of encephalopathy 05/27/2015   admission w/ acute encephalopathy thought to be secondary to pain meds and COPD   History of gastric ulcer    History of Helicobacter pylori infection    History of kidney stones    History of TIA (transient ischemic attack) 2008  and  10-19-2018    no residual's   History of traumatic head injury 2010   w/ LOC  per pt needed stitches, hit in head with a mower blade   Hyperlipidemia    Hypertension    Hypogonadism male    s/p  bilateral orchiectomy   Hypothyroidism    Insomnia    Mild obstructive sleep apnea    study in epic 12-04-2016, no cpap   PTSD (post-traumatic stress disorder)    chronic   PTSD (post-traumatic stress disorder)    RA (rheumatoid arthritis) (HCC)    followed by [facility]   Seizures, transient [facility]) neurologist-  dr Terrace Arabia--  differential dx complex partial seizure .vs.  mood disorder .vs.  pseudoseizure--  negative EEG's   confusion episodes and staring spells since 11/ 2015   (03-26-2020 per pt wife last seizure 10 /2021)   Suicide attempt by drug overdose (HCC) 07/28/2021   Transient confusion NEUOROLOGIST-  DR Terrace Arabia   Episodes since 11/ 2015--  neurologist dx  differential complex partial seizure  .vs. mood disorder . vs. pseudoseizure   Type 2 diabetes mellitus treated with insulin [facility])    endocrinologist--- dr Everardo All---  (03-26-2020 pt does not check blood sugar at home)    Subjective:   I have reviewed medical  records including EPIC notes, labs and imaging, received update from nursing, assessed the patient and then Dr. Chestine Spore and I met with the patient's family (daughters Morrie Sheldon and Cushing, ex wife Hilary Hertz, and two sisters) to discuss diagnosis prognosis, GOC, EOL wishes, disposition and options.  I introduced Palliative Medicine as specialized medical care for people living with serious illness. It focuses on providing relief from symptoms and stress of a serious illness. The goal is to improve quality of life for both the patient and the family.  Patient and family faces treatment option decisions, advanced directive decisions, and anticipatory care needs.  Today's Discussion: Patient's family shared their understanding of the patient's acute hospitalization and  functional status. Dr. Chestine Spore shared her understanding of his acute hospitalization, comorbidities, and functional status.  Family shared that the patient's functional status was low and he required assistance with all ADLs. They share that the patient's quality of life was poor prior to admission and he was tired from his ongoing medical concerns.  Patient had a failed extubation attempt 05/02/23. Discussed his current intubation and secretions are currently a barrier to extubation. Dr. Chestine Spore encouraged the family to consider a plan for the next extubation attempt. After discussing what an aggressive medical intervention path and comfort path would look like for this patient, the family decided to move forward with a compassionate extubation tomorrow. Discussed code status and family was agreeable to making the patient DNR. A comfort based path best aligns with the patient's goals of care and acceptable quality of life.  Discussed the importance of continued conversation with family and the medical providers regarding overall plan of care and treatment options, ensuring decisions are within the context of the patient's values and GOCs.  Questions and concerns were addressed. Hard Choices booklet left for review. The family was encouraged to call with questions or concerns. PMT will continue to support holistically.  Review of Systems  Unable to perform ROS   Objective:   Primary Diagnoses: Present on Admission:  Cardiac arrest [facility])   Physical Exam Vitals reviewed.  Constitutional:      General: He is not in acute distress.    Appearance: He is ill-appearing.     Interventions: He is intubated.  Cardiovascular:     Rate and Rhythm: Normal rate.  Pulmonary:     Effort: He is intubated.     Vital Signs:  BP (!) 118/56   Pulse 82   Temp 99.7 F (37.6 C) (Axillary)   Resp 20   Ht 6' (1.829 m)   Wt 81.4 kg   SpO2 98%   BMI 24.34 kg/m   Palliative Assessment/Data: 30% with  feeds    Advanced Care Planning:   Existing Vynca/ACP Documentation: None  Primary Decision Maker: NEXT OF KIN  Code Status/Advance Care Planning: DNR   Assessment & Plan:   SUMMARY OF RECOMMENDATIONS   DNR Compassionate extubation likely tomorrow PMT support as needed    Discussed with: bedside RN and Dr. Chestine Spore  Time Total: 75 minutes   Thank you for allowing [place] to participate in the care of [facility] PMT will continue to support holistically.    Signed by: Sarina Ser, NP Palliative Medicine Team  Team Phone # [phone] (Nights/Weekends)  05/05/2023, 11:56 AM

## 2023-05-05 NOTE — Plan of Care (Signed)
  Problem: Safety: Goal: Non-violent Restraint(s) Outcome: Progressing   Problem: Nutritional: Goal: Maintenance of adequate nutrition will improve Outcome: Progressing   Problem: Nutrition: Goal: Adequate nutrition will be maintained Outcome: Progressing

## 2023-05-05 NOTE — IPAL (Signed)
  Interdisciplinary Goals of Care Family Meeting   Date carried out:: 05/05/2023  Location of the meeting: Conference room  Member's involved: Physician, Family Member or next of kin, and Palliative care team member  Durable Power of Attorney or acting medical decision maker: 2 daughters    Discussion: We discussed goals of care for Vincent [name] .  Mr. Maharaj 2 daughters, step-son, former wife, sisters (one in person, one via phone) were present to discuss his ongoing care with myself and Palliative Care. We reviewed his care and concern that he would require a trach due to NM weakness and high chance of failing extubation again. We reviewed his extensive medical history, previous QOL, and expectation for care requirements long-term if he required a trach. With poor cough mechanics, he is above average risk for long-term trach requirement. Due to his chronic neuropathy, weakness, and baseline debility, he would almost certainly require SNF to meet his care needs. His family is certain that he would not want this long-term, and they do not think he would therefore want a trach. They related he has expressed being "tired" and poor QOL before this admission. They are worried about promoting suffering with ongoing aggressive care. They wish to proceed with terminal extubation tomorrow, focusing on comfort care. Family will be coming through to visit him, and they will communicate with additional family members who want to visit. DNR if he arrests before tomorrow.  Code status: Full DNR  Disposition: Continue current acute care; terminal extubation planned 2/23.   Time spent for the meeting: 46 min.  Steffanie Dunn 05/05/2023, 12:53 PM

## 2023-05-06 DIAGNOSIS — J9601 Acute respiratory failure with hypoxia: Secondary | ICD-10-CM | POA: Diagnosis not present

## 2023-05-06 DIAGNOSIS — Z7189 Other specified counseling: Secondary | ICD-10-CM | POA: Diagnosis not present

## 2023-05-06 DIAGNOSIS — I469 Cardiac arrest, cause unspecified: Secondary | ICD-10-CM | POA: Diagnosis not present

## 2023-05-06 DIAGNOSIS — J9602 Acute respiratory failure with hypercapnia: Secondary | ICD-10-CM | POA: Diagnosis not present

## 2023-05-06 DIAGNOSIS — Z515 Encounter for palliative care: Secondary | ICD-10-CM | POA: Diagnosis not present

## 2023-05-06 DIAGNOSIS — J09X1 Influenza due to identified novel influenza A virus with pneumonia: Secondary | ICD-10-CM | POA: Diagnosis not present

## 2023-05-06 LAB — GLUCOSE, CAPILLARY
Glucose-Capillary: 263 mg/dL — ABNORMAL HIGH (ref 70–99)
Glucose-Capillary: 266 mg/dL — ABNORMAL HIGH (ref 70–99)
Glucose-Capillary: 292 mg/dL — ABNORMAL HIGH (ref 70–99)

## 2023-05-06 MED ORDER — FENTANYL 2500MCG IN NS 250ML (10MCG/ML) PREMIX INFUSION
0.0000 ug/h | INTRAVENOUS | Status: DC
  Administered 2023-05-06: 100 ug/h via INTRAVENOUS
  Administered 2023-05-06: 50 ug/h via INTRAVENOUS
  Administered 2023-05-07: 250 ug/h via INTRAVENOUS
  Administered 2023-05-07: 225 ug/h via INTRAVENOUS
  Administered 2023-05-08: 275 ug/h via INTRAVENOUS
  Filled 2023-05-06 (×4): qty 250

## 2023-05-06 MED ORDER — GLYCOPYRROLATE 0.2 MG/ML IJ SOLN: 0.2000 mg | INTRAMUSCULAR | Status: DC | PRN

## 2023-05-06 MED ORDER — ACETAMINOPHEN 325 MG PO TABS: 650.0000 mg | ORAL_TABLET | Freq: Four times a day (QID) | ORAL | Status: DC | PRN

## 2023-05-06 MED ORDER — POLYVINYL ALCOHOL 1.4 % OP SOLN: 1.0000 [drp] | Freq: Four times a day (QID) | OPHTHALMIC | Status: DC | PRN

## 2023-05-06 MED ORDER — ACETAMINOPHEN 650 MG RE SUPP: 650.0000 mg | Freq: Four times a day (QID) | RECTAL | Status: DC | PRN

## 2023-05-06 MED ORDER — GLYCOPYRROLATE 1 MG PO TABS: 1.0000 mg | ORAL_TABLET | ORAL | Status: DC | PRN

## 2023-05-06 MED ORDER — GLYCOPYRROLATE 0.2 MG/ML IJ SOLN
0.2000 mg | INTRAMUSCULAR | Status: DC | PRN
  Administered 2023-05-06: 0.2 mg via INTRAVENOUS
  Filled 2023-05-06: qty 1

## 2023-05-06 MED ORDER — FENTANYL BOLUS VIA INFUSION
100.0000 ug | INTRAVENOUS | Status: DC | PRN
  Administered 2023-05-06 – 2023-05-07 (×4): 100 ug via INTRAVENOUS
  Administered 2023-05-07: 300 ug via INTRAVENOUS
  Administered 2023-05-07 – 2023-05-08 (×8): 100 ug via INTRAVENOUS

## 2023-05-06 MED ORDER — INSULIN ASPART 100 UNIT/ML IJ SOLN: 0.0000 [IU] | INTRAMUSCULAR | Status: DC

## 2023-05-06 MED ORDER — SODIUM CHLORIDE 0.9 % IV SOLN: INTRAVENOUS | Status: AC

## 2023-05-06 NOTE — Progress Notes (Signed)
 NAME:  Vincent [name], MRN:  [id number], DOB:  08-11-1965, LOS: 6 ADMISSION DATE:  04/30/2023 CONSULTATION DATE:  04/30/2023 REFERRING MD:  Renaye Rakers - EDP, CHIEF COMPLAINT:  Cardiac arrest   History of Present Illness:  58 year old man who presented to [facility] ED 2/17 after OOH cardiac arrest. PMHx significant for HTN, HLD, CAD, TIA, COPD, mild OSA, T2DM (c/b gastroparesis and diabetic peripheral neuropathy), hypothyroidism, GERD, PUD/H. Pylori), bladder CA, hypogonadism (s/p bilateral orchiectomy), DDD with chronic back pain, RA, benign essential tremor, transient seizure vs. pseudoseizure, bipolar disorder, anxiety, PTSD. -Discharged home from rehab SNF ~ 1 week PTA; tested positive for influenza at the facility and facility MD called Tamiflu in. Reportedly taking Tamiflu at home. Patient had also reported UTI symptoms to his sister and complained of not feeling well. On evening of admission, patient was found unresponsive and apneic. CPR started by sister with coaching from 911 dispatcher. Fire arrived and continued CPR; on EMS arrival initial rhythm was PEA. ROSC obtained after 15 mins of ACLS measures including Epi x 2, NS bolus, nebs and intubation.   On arrival to ER, patient had a GCS of 3 and was requiring Epi gtt. Labs were notable for WBC 37, Hgb 8.5 (near baseline), Plt 444. Na 139, K 3.3, CO2 17, Cr 1.25 (baseline 0.8-1). Glucose 509. Transaminases WNL, Alk Phos 136, Tbili 0.5. INR 1.3. Trop 13. PCT 0.42. Flu A+. BCx, UCx and Resp Cx pending. Broad-spectrum Vanc/Zosyn started. CXR demonstrated bilateral diffuse interstitial hazy airspace opacities. CT Head NAICA, C-spine without acute findings. CTA PE Protocol negative for PE, +diffuse airspace/GGO most prominent in LLL c/w PNA and trace L pleural fluid, bilateral rib fractures. CT Abd with stool ball in rectum c/f fecal impaction, +suprapubic catheter within thick-walled bladder. Of noted, at the time of exam, patient was awake and intermittently  following commands.  PCCM consulted for ICU admission.  Pertinent Medical History:  Anxiety Benign essential tremo Bipolar 2 disorder  Bladder cancer  CAD  Chronic pain syndrome, COPD  DDD Gastroparesis GERD  History of gastric ulcer History of Helicobacter pylori infection History of TIA  History of traumatic head injury (2010),  Hyperlipidemia Hypertension Hypothyroidism PTSD RA Suicide attempt by drug overdose  Type 2 diabetes mellitus  History of ESBL infection and MRSA   Significant Hospital Events: Including procedures, antibiotic start and stop dates in addition to other pertinent events   2/17 - Presented to [facility] via EMS post-OOH cardiac arrest. Intubated. Labs as above. CT Head NAICA, C-spine negative, CTA PE protocol negative for PE but +hazy interstitial and GGOs c/f PNA, CT Abd with stool ball/thick walled bladder with catheter in place. Vanc/Zosyn started. Epi gtt weaning over to Levo. PCCM consulted for ICU admission. 2/19 extubated midmorning, failed trial with reintubation by evening 2/20 stable on vent this a.m., tolerating ventilator.  Hemoglobin dropped to 6.8, HT nebs started.   Interim History / Subjective:  Family at bedside for compassionate extubation today.  Objective:  Blood pressure (!) 140/74, pulse 84, temperature 99.1 F (37.3 C), temperature source Axillary, resp. rate (!) 21, height 6' (1.829 m), weight 81.4 kg, SpO2 97%.    Vent Mode: PRVC FiO2 (%):  [40 %] 40 % Set Rate:  [20 bmp] 20 bmp Vt Set:  [620 mL] 620 mL PEEP:  [5 cmH20] 5 cmH20 Plateau Pressure:  [15 cmH20-23 cmH20] 20 cmH20   Intake/Output Summary (Last 24 hours) at 05/06/2023 1801 Last data filed at 05/06/2023 1100 Gross per 24 hour  Intake 1259.81 ml  Output 1100 ml  Net 159.81 ml   Filed Weights   05/02/23 0500 05/03/23 0500 05/05/23 0500  Weight: (P) 85.1 kg 81.2 kg 81.4 kg   Physical Examination: General chronically ill appearing man lying in bed in NAD HENT   [place]/AT, eyes anicteric Pulm rhales and rhonchi in bases, no accessory muscle use, breathing comfortably on [place] Card: S1S2, RRR Abd soft, NT Ext  minimal muscle mass, mild edema Neuro  awake, alert, moving all extremities Gu suprapubic catheter, yellow urine   Resolved Hospital Problem List:  Septic shock PEA cardiac arrest  OOH (admitting dx)  Acute metabolic encephalopathy s/p cardiac arrest, resolved  Assessment & Plan:   Acute hypoxemic and hypercarbic respiratory failure post-cardiac arrest complicated further by influenza A  Aspiration PNA involving left lower lobe, growing rare Klebsiella Aerogenes and rare group B strep History of COPD History of mild OSA -Failed extubation attempt 2/19 secondary to inability to clear secretions due to NM weakness, poor cough mechanics -compassionate extubation, family wants him to be comfortable. Palliative care at bedside during extubation. Extubated on fentanyl. No plans for aggressive secretion management or reintubation. -dysphagia diet for comfort  Chest wall pain from chest compressions -lidocaine patch, fentanyl IV  Bipolar disorder, Anxiety, PTSD Transient seizures vs. Pseudoseizure and Hx of CVA/prior TIA Essential tremor, he is nearly WC bound at baseline  Severe peripheral neuropathy from poorly controlled DM long-term Anisocoria: right pupil > left -con't PTA latuda, requip, effexor, requip -PAD protocol -GOC discussions ongoing-- updated multiple family members at bedside this afternoon  AKI, mild, likely ATN in the setting of sepsis/cardiac arrest History of bladder CA Chronic indwelling suprapubic catheter, neurogenic bladder -strict I/O -renally dose meds, avoid nephrotoxic meds -suprapubic catheter  Paroxysmal Afib- new onset with acute illness HTN HLD  CAD -d/c aspirin, statin -no need for rate control meds  T2DM with hyperglycemia -no accuchecks or insuin  GERD, Diabetic Gastroparesis History of PUD/ H.  Pylori -PPI d/c  Anemia -no additional monitoring  Pressure wounds of bilateral heels, POA -wound care, pain control  RA DDD Chronic back pain -hold PTA plaquenil   Comfort-focused care. Multiple family members at bedside. Daughters are Research officer, political party.   Best Practice: (right click and "Reselect all SmartList Selections" daily)   Diet/type: dysphagia diet (see orders) DVT prophylaxis: not indicated  GI prophylaxis: N/A Lines: Central line and Arterial Line Foley:  N/A - indwelling suprapubic catheter  Code Status:  DNR Last date of multidisciplinary goals of care discussion [2/22  Critical care time:      Steffanie Dunn, DO 05/06/23 6:01 PM [facility]  For contact information, see Amion. If no response to pager, please call PCCM consult pager. After hours, 7PM- 7AM, please call Elink.

## 2023-05-06 NOTE — Progress Notes (Signed)
 Daily Progress Note   Patient Name: Vincent [name]       Date: 05/06/2023 DOB: 1965/09/04  Age: 58 y.o. MRN#: [id number] Attending Physician: Steffanie Dunn, DO Primary Care Physician: Dois Davenport, MD Admit Date: 04/30/2023  Reason for Consultation/Follow-up: Establishing goals of care  Length of Stay: 6  Current Medications: Scheduled Meds:   Chlorhexidine Gluconate Cloth  6 each Topical Daily   guaiFENesin  10 mL Per Tube Q6H   lidocaine  1 patch Transdermal Q24H   lurasidone  40 mg Per Tube Q breakfast   mouth rinse  15 mL Mouth Rinse Q2H   pantoprazole (PROTONIX) IV  40 mg Intravenous QHS   polyethylene glycol  17 g Per Tube Daily   rOPINIRole  0.5 mg Per Tube QHS   sodium chloride flush  10-40 mL Intracatheter Q12H   venlafaxine  75 mg Per Tube BID    Continuous Infusions:  sodium chloride     fentaNYL infusion INTRAVENOUS 50 mcg/hr (05/06/23 1100)   fentaNYL infusion INTRAVENOUS 50 mcg/hr (05/06/23 1139)    PRN Meds: acetaminophen **OR** acetaminophen, fentaNYL, fentaNYL, glycopyrrolate **OR** glycopyrrolate **OR** glycopyrrolate, ipratropium-albuterol, midazolam, mouth rinse, oxyCODONE, polyvinyl alcohol, sodium chloride flush  Physical Exam Vitals reviewed.  Constitutional:      General: He is not in acute distress.    Appearance: He is ill-appearing.     Interventions: He is intubated.  HENT:     Head: Normocephalic and atraumatic.  Cardiovascular:     Rate and Rhythm: Normal rate.  Pulmonary:     Effort: He is intubated.  Skin:    General: Skin is warm and dry.  Neurological:     Mental Status: He is alert.  Psychiatric:        Behavior: Behavior normal.             Vital Signs: BP (!) 140/74   Pulse 88   Temp 99.1 F (37.3 C) (Axillary)   Resp  20   Ht 6' (1.829 m)   Wt 81.4 kg   SpO2 96%   BMI 24.34 kg/m  SpO2: SpO2: 96 % O2 Device: O2 Device: Ventilator O2 Flow Rate: O2 Flow Rate (L/min): 10 L/min        Palliative Assessment/Data: 30% with feeds      Patient Active  Problem List   Diagnosis Date Noted   Malnutrition of moderate degree 05/01/2023   Cardiac arrest (HCC) 04/30/2023   Abscess, jaw 03/25/2023   Facial abscess 03/24/2023   Hx of abdominal abscess 01/18/2023   Sepsis with acute renal failure (HCC) 09/25/2022   Abscess, gluteal, right 09/25/2022   Peripheral neuropathy in hands 06/08/2022   Essential hypertension 05/23/2022   BPH (benign prostatic hyperplasia) 05/23/2022   AKI (acute kidney injury) (HCC) 09/04/2021   Hypotension 09/03/2021   History of seizure 08/02/2021   Cerebral thrombosis with cerebral infarction 11/16/2020   Ischemic stroke (HCC) 06/10/2020   Pain due to onychomycosis of toenails of both feet 01/28/2020   Hyperglycemia due to type 2 diabetes mellitus (HCC) 01/07/2020   Resistance to insulin 01/07/2020   Right leg weakness 04/24/2019   Right leg pain 04/24/2019   Gait abnormality 04/24/2019   TIA (transient ischemic attack) 10/20/2018   DM2 (diabetes mellitus, type 2) (HCC) 10/19/2018   Left sided numbness 10/19/2018   Diabetic autonomic neuropathy associated with type 2 diabetes mellitus (HCC) 02/01/2018   DM type 2 with diabetic peripheral neuropathy (HCC) 02/01/2018   Dyslipidemia 01/31/2018   Coronary artery disease involving native coronary artery of native heart without angina pectoris 01/31/2018   Chest pain 01/01/2018   Breakthrough seizure (HCC) 12/19/2016   Essential tremor 12/19/2016   Chronic post-traumatic stress disorder (PTSD) 06/16/2016   Tremor 06/16/2016   Tobacco abuse 05/05/2016   Altered mental status 05/28/2015   Type 2 diabetes mellitus with hyperglycemia (HCC) 05/28/2015   Hypothyroidism 05/28/2015   Bipolar 2 disorder (HCC) 05/28/2015    History of rheumatoid arthritis 05/28/2015   Chronic pain 05/28/2015   Bipolar II disorder, most recent episode major depressive (HCC) 03/18/2015   Carpal tunnel syndrome on left 02/10/2015   Carpal tunnel syndrome on right 02/10/2015   Diabetes (HCC) 01/23/2015   Obesity (BMI 30-39.9) 03/06/2014   Acute encephalopathy 03/05/2014   Hyperlipidemia 03/05/2014   Anemia, normocytic normochromic 03/05/2014   Altered mental state    Helicobacter pylori (H. pylori) infection 11/20/2012   Protein-calorie malnutrition, severe (HCC) 10/09/2012   Dysphagia 08/19/2012   Gastroparesis 08/19/2012   Bladder tumor 08/08/2012   Biliary dyskinesia 11/02/2010    Palliative Care Assessment & Plan   Patient Profile: 58 y.o. male  with past medical history of HTN, HLD, CAD, TIA, COPD, mild OSA, T2DM (c/b gastroparesis and diabetic peripheral neuropathy), hypothyroidism, GERD, PUD/H. Pylori), bladder CA, hypogonadism (s/p bilateral orchiectomy), DDD with chronic back pain, RA, benign essential tremor, transient seizure vs. pseudoseizure, bipolar disorder, anxiety, PTSD admitted on 04/30/2023 after OOH cardiac arrest.   Today's Discussion: The patient is alert and sitting up in bed. He is intubated. Family is at bedside. We discuss the plan for a compassionate extubation today. We discuss the plan to transition to comfort measures after extubation. Explain the extubation process. Family's questions answered. Emotional support provided. Family decline chaplain services. They are waiting for the entire family to be present to extubate.  1325: Patient has been extubated. He says it feels good to have the tube out. He is sitting up in bed talking with his family. Thee patient and family are both glad to have this time together. They ask if he can have anything to eat or drink. We discuss that he may eat/drink as desired for end of life- careful hand feed. We discussed there is a risk for aspiration but since care is  comfort focussed this is allowed. Shared that  they could also use oral swabs so he can enjoy his favorite drinks.  Encouraged family to call PMT with any questions or concerns.  Recommendations/Plan: DNR/DNI Compassionate extubation Comfort measures: meds per [facility] PMT support as needed   Code Status:    Code Status Orders  (From admission, onward)           Start     Ordered   05/06/23 1106  Do not attempt resuscitation (DNR) - Comfort care  Continuous       Question Answer Comment  If patient has no pulse and is not breathing Do Not Attempt Resuscitation   In Pre-Arrest Conditions (Patient Is Breathing and Has a Pulse) Provide comfort measures. Relieve any mechanical airway obstruction. Avoid transfer unless required for comfort.   Consent: Discussion documented in EHR or advanced directives reviewed      05/06/23 1107        Extensive chart review has been completed prior to seeing the patient including labs, vital signs, imaging, progress/consult notes, orders, medications, and available advance directive documents.  Prognosis:  Hours - Days  Discharge Planning: Anticipated Hospital Death  Care plan was discussed with Dr. Kemper Durie and bedside RN  Time spent: 65 minutes  Thank you for allowing the Palliative Medicine Team to assist in the care of this patient.   Sherryll Burger, NP  Please contact Palliative Medicine Team phone at [phone] for questions and concerns.

## 2023-05-07 DIAGNOSIS — Z515 Encounter for palliative care: Secondary | ICD-10-CM | POA: Diagnosis not present

## 2023-05-07 DIAGNOSIS — Z7189 Other specified counseling: Secondary | ICD-10-CM | POA: Diagnosis not present

## 2023-05-07 DIAGNOSIS — J9601 Acute respiratory failure with hypoxia: Secondary | ICD-10-CM | POA: Diagnosis not present

## 2023-05-07 DIAGNOSIS — G9341 Metabolic encephalopathy: Secondary | ICD-10-CM | POA: Diagnosis not present

## 2023-05-07 DIAGNOSIS — R451 Restlessness and agitation: Secondary | ICD-10-CM

## 2023-05-07 MED ORDER — LORAZEPAM 2 MG/ML IJ SOLN
2.0000 mg | Freq: Four times a day (QID) | INTRAMUSCULAR | Status: DC
  Administered 2023-05-07 – 2023-05-08 (×5): 2 mg via INTRAVENOUS
  Filled 2023-05-07 (×5): qty 1

## 2023-05-07 MED ORDER — GLYCOPYRROLATE 0.2 MG/ML IJ SOLN
0.4000 mg | INTRAMUSCULAR | Status: DC | PRN
  Administered 2023-05-07 – 2023-05-08 (×5): 0.4 mg via INTRAVENOUS
  Filled 2023-05-07 (×5): qty 2

## 2023-05-07 NOTE — Progress Notes (Signed)
 eLink Physician-Brief Progress Note Patient Name: Vincent [name] DOB: [birth year] MRN: [id number]   Date of Service  05/07/2023  HPI/Events of Note  Received request from the family to replace the suprapubic catheter as it is leaking and he is wetting the bed.   The patient is comfort care only at this point.   eICU Interventions  Ok to replace suprapubic catheter as per family request.      Intervention Category Minor Interventions: Other:  Larinda Buttery 05/07/2023, 10:34 PM

## 2023-05-07 NOTE — Progress Notes (Signed)
 NAME:  Vincent [name], MRN:  [id number], DOB:  April 16, 1965, LOS: 7 ADMISSION DATE:  04/30/2023 CONSULTATION DATE:  04/30/2023 REFERRING MD:  Renaye Rakers - EDP, CHIEF COMPLAINT:  Cardiac arrest   History of Present Illness:  58 year old man who presented to [facility] ED 2/17 after OOH cardiac arrest. PMHx significant for HTN, HLD, CAD, TIA, COPD, mild OSA, T2DM (c/b gastroparesis and diabetic peripheral neuropathy), hypothyroidism, GERD, PUD/H. Pylori), bladder CA, hypogonadism (s/p bilateral orchiectomy), DDD with chronic back pain, RA, benign essential tremor, transient seizure vs. pseudoseizure, bipolar disorder, anxiety, PTSD. -Discharged home from rehab SNF ~ 1 week PTA; tested positive for influenza at the facility and facility MD called Tamiflu in. Reportedly taking Tamiflu at home. Patient had also reported UTI symptoms to his sister and complained of not feeling well. On evening of admission, patient was found unresponsive and apneic. CPR started by sister with coaching from 911 dispatcher. Fire arrived and continued CPR; on EMS arrival initial rhythm was PEA. ROSC obtained after 15 mins of ACLS measures including Epi x 2, NS bolus, nebs and intubation.   On arrival to ER, patient had a GCS of 3 and was requiring Epi gtt. Labs were notable for WBC 37, Hgb 8.5 (near baseline), Plt 444. Na 139, K 3.3, CO2 17, Cr 1.25 (baseline 0.8-1). Glucose 509. Transaminases WNL, Alk Phos 136, Tbili 0.5. INR 1.3. Trop 13. PCT 0.42. Flu A+. BCx, UCx and Resp Cx pending. Broad-spectrum Vanc/Zosyn started. CXR demonstrated bilateral diffuse interstitial hazy airspace opacities. CT Head NAICA, C-spine without acute findings. CTA PE Protocol negative for PE, +diffuse airspace/GGO most prominent in LLL c/w PNA and trace L pleural fluid, bilateral rib fractures. CT Abd with stool ball in rectum c/f fecal impaction, +suprapubic catheter within thick-walled bladder. Of noted, at the time of exam, patient was awake and intermittently  following commands.  PCCM consulted for ICU admission.  Pertinent Medical History:  Anxiety Benign essential tremo Bipolar 2 disorder  Bladder cancer  CAD  Chronic pain syndrome, COPD  DDD Gastroparesis GERD  History of gastric ulcer History of Helicobacter pylori infection History of TIA  History of traumatic head injury (2010),  Hyperlipidemia Hypertension Hypothyroidism PTSD RA Suicide attempt by drug overdose  Type 2 diabetes mellitus  History of ESBL infection and MRSA   Significant Hospital Events: Including procedures, antibiotic start and stop dates in addition to other pertinent events   2/17 - Presented to [facility] via EMS post-OOH cardiac arrest. Intubated. Labs as above. CT Head NAICA, C-spine negative, CTA PE protocol negative for PE but +hazy interstitial and GGOs c/f PNA, CT Abd with stool ball/thick walled bladder with catheter in place. Vanc/Zosyn started. Epi gtt weaning over to Levo. PCCM consulted for ICU admission. 2/19 extubated midmorning, failed trial with reintubation by evening 2/20 stable on vent this a.m., tolerating ventilator.  Hemoglobin dropped to 6.8, HT nebs started.   Interim History / Subjective:  Agitated this morning-- trying to get OOB, very restless. Started on lorazepam, which has made him lethargic. WOB has been more today and daughters can hear rattling of secretions-- on higher fentanyl dose to make him comfortable this morning. Yesterday he was having hallucinations.   Objective:  Blood pressure (!) 158/66, pulse 80, temperature 99.1 F (37.3 C), temperature source Axillary, resp. rate 14, height 6' (1.829 m), weight 81.4 kg, SpO2 (!) 86%.        Intake/Output Summary (Last 24 hours) at 05/07/2023 1459 Last data filed at 05/07/2023 1400 Gross per 24  hour  Intake 415.42 ml  Output --  Net 415.42 ml   Filed Weights   05/02/23 0500 05/03/23 0500 05/05/23 0500  Weight: (P) 85.1 kg 81.2 kg 81.4 kg   Physical Examination: General  chronically ill appearing many lying in bed sleeping HENT  [place]/AT Pulm rhales across all lung fields, rattling of upper airway secretions. Using abdominal accessory muscles.  Card: S1S2, RRR Abd soft, NT Ext  minimal muscle mass Neuro  sleeping     Resolved Hospital Problem List:  Septic shock PEA cardiac arrest  OOH (admitting dx)  Acute metabolic encephalopathy s/p cardiac arrest, resolved  Assessment & Plan:   Acute hypoxemic and hypercarbic respiratory failure post-cardiac arrest complicated further by influenza A  Aspiration PNA involving left lower lobe, growing rare Klebsiella Aerogenes and rare group B strep History of COPD History of mild OSA Failed extubation attempt 2/19 secondary to inability to clear secretions due to NM weakness, poor cough mechanics Chest wall pain from chest compressions Bipolar disorder, Anxiety, PTSD Transient seizures vs. 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 Pylori Anemia Pressure wounds of bilateral heels, POA RA DDD Chronic back pain Acute metabolic encephalopathy with hallucinations  Appreciate Palliative care team's management. Agree with escalation in fentanyl and lorazepam doses. Asked RN for fentanyl to be increased again with accessory muscle use. Family agrees with escalation in medication regimen and has been present to reassure him when he stirs, but is happy to see him comfortable. Both daughters, their mother/ patient's former wife, and a male family member present and updated with RN.  -planning for  transfer to 6N for comfort care -pleasure feeds, all interventions to focus on his comfort & dignity    Best Practice: (right click and "Reselect all SmartList Selections" daily)   Diet/type: dysphagia diet (see orders) DVT prophylaxis: not indicated  GI prophylaxis: N/A Lines: Central line Foley:  N/A - indwelling suprapubic catheter  Code Status:  DNR Last date of multidisciplinary goals of care discussion [2/22  Critical care time:      Steffanie Dunn, DO 05/07/23 3:06 PM [facility]  For contact information, see Amion. If no response to pager, please call PCCM consult pager. After hours, 7PM- 7AM, please call Elink.

## 2023-05-07 NOTE — Progress Notes (Addendum)
   05/07/23 1951  Vitals  Temp (!) 97.5 F (36.4 C)  Temp Source Oral  BP (!) 135/59  MAP (mmHg) 78  BP Location Left Arm  BP Method Automatic  Patient Position (if appropriate) Lying  Pulse Rate 85  Pulse Rate Source Monitor  Resp 18  MEWS COLOR  MEWS Score Color Comfort Care Only  Oxygen Therapy  SpO2 (!) 86 %  O2 Device Room Air  MEWS Score  MEWS Temp 0  MEWS Systolic 0  MEWS Pulse 0  MEWS RR 0  MEWS LOC 0  MEWS Score 0   Patient on comfort care.   Family members at bedside. Pt is non-responsive, open eyes at times with verbal stimuli, tachypneic and restlessness noted at times and PRN fentanyl given as ordered and continuous fentantyl drip infusing as ordered- see emar.  Sinus rhythm on the tele monitor and O2 ranges 78-85% on room air.   Noted suprapubic catheter site leaking and Elink on-call MD notified and spoke with Jody (critical care RN) and spoke with Dr. Valora Piccolo and updated about suprapubic cath. New order to exchange catheter received.  Safety maintained. Bed alarm on. Will continue to monitor.

## 2023-05-07 NOTE — Progress Notes (Addendum)
 Palliative Medicine Inpatient Follow Up Note HPI: 58 year old man who presented to [facility] ED 2/17 after OOH cardiac arrest. PMHx significant for HTN, HLD, CAD, TIA, COPD, mild OSA, T2DM (c/b gastroparesis and diabetic peripheral neuropathy), hypothyroidism, GERD, PUD/H. Pylori), bladder CA, hypogonadism (s/p bilateral orchiectomy), DDD with chronic back pain, RA, benign essential tremor, transient seizure vs. pseudoseizure, bipolar disorder, anxiety, PTSD. admitted on 04/30/2023 after OOH cardiac arrest.  Palliative care involved to support additional goals of care conversations.   Today's Discussion 05/07/2023  *Please note that this is a verbal dictation therefore any spelling or grammatical errors are due to the "[employer]" system interpretation.  Chart reviewed inclusive of vital signs, progress notes, laboratory results, and diagnostic images.   I met with patients step-son, two daughters, an former wife this morning. Created space and opportunity for patients family to explore thoughts feelings and fears regarding Vincent [name] current medical situation.  The family asks if this could be reversed or turned around. I provided assurance based upon his chronic history and acute medical illness that they have made the right decision for the patient.   We reviewed the plan for medication optimization for comfort. Discussed taking coretrack out which everyone was in agreement with.   Provided education on his present symptoms in the setting of end of life. We reviewed that patients had a confused episode whereby he was trying to get out of bed - discussed terminal agitation and reviewed the plan form symptom support.  Vincent [name] is alert to self. He was exemplifying  diaphragmatic breathing while at bedside. A bolus was provided, he appeared more comfortable thereafter.  Family asks if I can come back once patient sister arrives. I shared that I would be happy to and provided my direct contact.   ______________________________________ Addendum:  I met with patients sister, Vincent [name] at bedside this afternoon. She is overwhelmed with grief. I allowed her time to express herself. I provided information on patients declining condition. I shared that we are at end of life and working to comfortably transition patient. She asks why his lungs have worsened. Insights provided on severity of disease burden   Questions and concerns addressed/Palliative Support Provided.   Objective Assessment: Vital Signs Vitals:   05/07/23 1100 05/07/23 1200  BP:    Pulse: 85 81  Resp: 17 15  Temp:    SpO2: (!) 87% (!) 86%    Intake/Output Summary (Last 24 hours) at 05/07/2023 1248 Last data filed at 05/07/2023 1200 Gross per 24 hour  Intake 347.6 ml  Output --  Net 347.6 ml   Last Weight  Most recent update: 05/05/2023  6:53 AM    Weight  81.4 kg (179 lb 7.3 oz)            Gen:  Middle aged M in moderate distress HEENT: Dry mucous membranes CV: Regular rate and rhythm, no murmurs rubs or gallops PULM: On RA, (+)  diaphragmatic breathing ABD: soft/nontender EXT: No edema Neuro: Alert to self   SUMMARY OF RECOMMENDATIONS   DNAR/DNI  Comfort Care  Continue fentanyl gtt with boluses  Have added ativan 2mg  IVP ATC  Additional Comfort medications per [facility]  Unrestricted visitation  Ongoing support  Billing based on MDM: High ______________________________________________________________________________________ Vincent [name] Palliative Medicine Team Team Cell Phone: [phone] Please utilize secure chat with additional questions, if there is no response within 30 minutes please call the above phone number  Palliative Medicine Team providers are available by phone from 7am  to 7pm daily and can be reached through the team cell phone.  Should this patient require assistance outside of these hours, please call the patient's attending physician.

## 2023-05-08 DIAGNOSIS — Z7189 Other specified counseling: Secondary | ICD-10-CM | POA: Diagnosis not present

## 2023-05-08 DIAGNOSIS — Z515 Encounter for palliative care: Secondary | ICD-10-CM | POA: Diagnosis not present

## 2023-05-12 NOTE — Death Summary Note (Signed)
 DEATH SUMMARY   Patient Details  Name: Vincent [name] MRN: [id number] DOB: [birth year]  Admission/Discharge Information   Admit Date:  05-24-23  Date of Death: Date of Death: 06/01/2023  Time of Death: Time of Death: 1221/06/06  Length of Stay: 8  Referring Physician: Dois Davenport, MD   Reason(s) for Hospitalization  Cardiac arrest  Diagnoses  Preliminary cause of death:  Secondary Diagnoses (including complications and co-morbidities):  Principal Problem:   Cardiac arrest [name] Active Problems:   Malnutrition of moderate degree   Acute respiratory failure with hypoxia (HCC)   Agitation   End of life care  Acute hypoxemic and hypercarbic respiratory failure post-cardiac arrest complicated further by influenza A  Aspiration PNA involving left lower lobe, growing rare Klebsiella Aerogenes and rare group B strep History of COPD History of mild OSA Failed extubation attempt 2/19 secondary to inability to clear secretions due to NM weakness, poor cough mechanics Chest wall pain from chest compressions Bipolar disorder, Anxiety, PTSD Transient seizures vs. 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 Pylori Anemia Pressure wounds of bilateral heels, POA RA DDD Chronic back pain Acute metabolic encephalopathy with hallucinations   Brief Hospital Course (including significant findings, care, treatment, and services provided and events leading to death)  Vincent [name] is a 58 y.o. year old male who  presented to [facility] ED May 24, 2023 after OOH cardiac arrest. PMHx  significant for HTN, HLD, CAD, TIA, COPD, mild OSA, T2DM (c/b gastroparesis and diabetic peripheral neuropathy), hypothyroidism, GERD, PUD/H. Pylori), bladder CA, hypogonadism (s/p bilateral orchiectomy), DDD with chronic back pain, RA, benign essential tremor, transient seizure vs. pseudoseizure, bipolar disorder, anxiety, PTSD. -Discharged home from rehab SNF ~ 1 week PTA; tested positive for influenza at the facility and facility MD called Tamiflu in. Reportedly taking Tamiflu at home. Patient had also reported UTI symptoms to his sister and complained of not feeling well. On evening of admission, patient was found unresponsive and apneic. CPR started by sister with coaching from 911 dispatcher. Fire arrived and continued CPR; on EMS arrival initial rhythm was PEA. ROSC obtained after 15 mins of ACLS measures including Epi x 2, NS bolus, nebs and intubation.    On arrival to ER, patient had a GCS of 3 and was requiring Epi gtt. Labs were notable for WBC 37, Hgb 8.5 (near baseline), Plt 444. Na 139, K 3.3, CO2 17, Cr 1.25 (baseline 0.8-1). Glucose 509. Transaminases WNL, Alk Phos 136, Tbili 0.5. INR 1.3. Trop 13. PCT 0.42. Flu A+. BCx, UCx and Resp Cx pending. Broad-spectrum Vanc/Zosyn started. CXR demonstrated bilateral diffuse interstitial hazy airspace opacities. CT Head NAICA, C-spine without acute findings. CTA PE Protocol negative for PE, +diffuse airspace/GGO most prominent in LLL c/w PNA and trace L pleural fluid, bilateral rib fractures. CT Abd with stool ball in rectum c/f fecal impaction, +suprapubic catheter within thick-walled bladder. Of noted, at the time of exam, patient was awake and intermittently following commands.   PCCM consulted for ICU admission.  The day following admission he passed a spontaneous breathing trial and was extubated.  He unfortunately failed extubation due to poor cough mechanics  and inability to use clear secretions with rising oxygen requirements despite  CoughAssist, chest PT, aggressive nebulizer regimen, NTS throughout the day and ongoing antibiotics.  Discussion with family indicated that he had very poor quality of life and was barely making it at home prior to admission.  We discussed my concern about neuromuscular weakness contributing to poor cough mechanics and the high risk that he would have recurrent mucous plugging and inability to manage secretions.  We discussed the possibility of a tracheostomy.  His family did not feel that further decline in his functional status and need for long-term care in a facility was consistent with his wishes.  They elected to compassionately extubate and focus on his comfort.  He passed away with many family members at bedside.  Pertinent Labs and Studies  Significant Diagnostic Studies DG Chest Port 1 View Result Date: 05/04/2023 CLINICAL DATA:  Pulmonary aspiration EXAM: PORTABLE CHEST 1 VIEW COMPARISON:  05/02/2023 FINDINGS: Endotracheal tube tip 3.2 cm above the carina. Right IJ central line tip: SVC. Feeding tube enters the stomach. Atherosclerotic calcification of the aortic arch. Continued retrocardiac airspace opacity obscuring the left hemidiaphragm. Mildly improved aeration at the right lung base. No acute bony findings. IMPRESSION: 1. Continued retrocardiac airspace opacity obscuring the left hemidiaphragm. 2. Mildly improved aeration at the right lung base. 3. Support apparatus as above. Electronically Signed   By: Gaylyn Rong M.D.   On: 05/04/2023 14:35   DG CHEST PORT 1 VIEW Result Date: 05/02/2023 CLINICAL DATA:  Respiratory failure EXAM: PORTABLE CHEST 1 VIEW COMPARISON:  04/01/2019 FINDINGS: Endotracheal tube is in place with its tip 2.8 cm above the carina. Nasoenteric feeding tube extends into the upper abdomen beyond the margin of the examination. Right internal jugular central venous catheter tip is seen within the superior vena cava. Left basilar consolidation and probable associated  small left pleural effusion are unchanged. Patchy infiltrates in throughout the remainder of the lungs, likely infectious unchanged. No pneumothorax. No pleural effusion on right. Cardiac size within limits. No acute bone abnormality. IMPRESSION: 1. Support lines and tubes in appropriate position. 2. Stable left basilar consolidation and probable associated small left pleural effusion. 3. Stable patchy infiltrates throughout the remainder of the lungs, likely infectious. Electronically Signed   By: Helyn Numbers M.D.   On: 05/02/2023 19:28   DG CHEST PORT 1 VIEW Result Date: 05/02/2023 CLINICAL DATA:  [id number] Hypoxia [id number] EXAM: PORTABLE CHEST 1 VIEW COMPARISON:  Chest x-ray 05/01/2023 FINDINGS: Interval removal of an endotracheal tube. Interval placement of an enteric tube with tip collimated off view coursing below the hemidiaphragm. Right internal jugular central venous catheter in stable position with tip overlying the expected region of the superior caval junction. The heart and mediastinal contours are within normal limits. Slightly increased to stable left mid lower lung zone consolidation. Bilateral increased interstitial markings. No right pleural effusion. Persistent at least small left pleural effusion. No pneumothorax. No acute osseous abnormality. IMPRESSION: 1. Slightly increased to stable left mid lower lung zone consolidation. 2. Bilateral increased interstitial markings suggestive of pulmonary edema. 3. Persistent at least small left pleural effusion. 4. Lines and tubes as above with interval removal of an endotracheal tube and interval placement of an enteric tube coursing below the diaphragm with tip collimated off view. Electronically Signed   By: Tish Frederickson M.D.   On: 05/02/2023 17:53   ECHOCARDIOGRAM COMPLETE Result Date: 05/01/2023    ECHOCARDIOGRAM REPORT   Patient Name:   Kyri Dai [facility] Date  of Exam: 05/01/2023 Medical Rec #:  [id number]       Height:       72.0 in Accession #:     [id number]      Weight:       178.6 lb Date of Birth:  February 23, 1966        BSA:          2.030 m Patient Age:    57 years        BP:           131/45 mmHg Patient Gender: M               HR:           80 bpm. Exam Location:  Inpatient Procedure: 2D Echo (Both Spectral and Color Flow Doppler were utilized during            procedure). Indications:    Cardiac arrest  History:        Patient has prior history of Echocardiogram examinations, most                 recent 09/25/2022. Stroke; Risk Factors:Hypertension, Diabetes,                 Dyslipidemia and Current Smoker.  Sonographer:    Dondra Prader RVT RCS Referring Phys: Elenore Paddy REESE IMPRESSIONS  1. Left ventricular ejection fraction, by estimation, is 60 to 65%. The left ventricle has normal function. The left ventricle has no regional wall motion abnormalities. There is mild concentric left ventricular hypertrophy. Left ventricular diastolic parameters were normal.  2. Right ventricular systolic function is normal. The right ventricular size is normal.  3. The mitral valve is normal in structure. No evidence of mitral valve regurgitation. No evidence of mitral stenosis.  4. The aortic valve is tricuspid. There is mild calcification of the aortic valve. Aortic valve regurgitation is not visualized.  5. The inferior vena cava is dilated in size with <50% respiratory variability, suggesting right atrial pressure of 15 mmHg. Comparison(s): Prior images reviewed side by side. IVC more dilated than prior. FINDINGS  Left Ventricle: Left ventricular ejection fraction, by estimation, is 60 to 65%. The left ventricle has normal function. The left ventricle has no regional wall motion abnormalities. Strain imaging was not performed. The left ventricular internal cavity  size was normal in size. There is mild concentric left ventricular hypertrophy. Left ventricular diastolic parameters were normal. Right Ventricle: The right ventricular size is normal. No increase in right  ventricular wall thickness. Right ventricular systolic function is normal. Left Atrium: Left atrial size was normal in size. Right Atrium: Right atrial size was normal in size. Pericardium: There is no evidence of pericardial effusion. Mitral Valve: The mitral valve is normal in structure. No evidence of mitral valve regurgitation. No evidence of mitral valve stenosis. Tricuspid Valve: The tricuspid valve is normal in structure. Tricuspid valve regurgitation is not demonstrated. No evidence of tricuspid stenosis. Aortic Valve: The aortic valve is tricuspid. There is mild calcification of the aortic valve. Aortic valve regurgitation is not visualized. Aortic valve mean gradient measures 5.0 mmHg. Aortic valve peak gradient measures 8.4 mmHg. Aortic valve area, by VTI measures 3.04 cm. Pulmonic Valve: The pulmonic valve was normal in structure. Pulmonic valve regurgitation is not visualized. No evidence of pulmonic stenosis. Aorta: The aortic root is normal in size and structure. Venous: The inferior vena cava is dilated in size with less than 50% respiratory variability, suggesting right atrial pressure  of 15 mmHg. IAS/Shunts: The atrial septum is grossly normal. Additional Comments: 3D imaging was not performed.  LEFT VENTRICLE PLAX 2D LVIDd:         4.73 cm   Diastology LVIDs:         3.15 cm   LV e' medial:    10.60 cm/s LV PW:         1.13 cm   LV E/e' medial:  8.2 LV IVS:        1.00 cm   LV e' lateral:   15.30 cm/s LVOT diam:     2.30 cm   LV E/e' lateral: 5.7 LV SV:         85 LV SV Index:   42 LVOT Area:     4.15 cm  RIGHT VENTRICLE             IVC RV Basal diam:  2.90 cm     IVC diam: 2.10 cm RV Mid diam:    2.40 cm RV S prime:     10.80 cm/s TAPSE (M-mode): 1.9 cm LEFT ATRIUM             Index        RIGHT ATRIUM           Index LA diam:        3.70 cm 1.82 cm/m   RA Area:     13.15 cm LA Vol (A2C):   27.6 ml 13.59 ml/m  RA Volume:   34.05 ml  16.77 ml/m LA Vol (A4C):   29.3 ml 14.43 ml/m LA Biplane  Vol: 32.4 ml 15.96 ml/m  AORTIC VALVE                    PULMONIC VALVE AV Area (Vmax):    3.04 cm     PV Vmax:       0.88 m/s AV Area (Vmean):   2.92 cm     PV Peak grad:  3.1 mmHg AV Area (VTI):     3.04 cm AV Vmax:           145.00 cm/s AV Vmean:          97.700 cm/s AV VTI:            0.280 m AV Peak Grad:      8.4 mmHg AV Mean Grad:      5.0 mmHg LVOT Vmax:         106.00 cm/s LVOT Vmean:        68.700 cm/s LVOT VTI:          0.205 m LVOT/AV VTI ratio: 0.73  AORTA Ao Root diam: 2.80 cm MITRAL VALVE MV Area (PHT): 5.02 cm    SHUNTS MV Decel Time: 151 msec    Systemic VTI:  0.20 m MV E velocity: 87.00 cm/s  Systemic Diam: 2.30 cm MV A velocity: 85.40 cm/s MV E/A ratio:  1.02 Riley Lam MD Electronically signed by Riley Lam MD Signature Date/Time: 05/01/2023/4:56:31 PM    Final    EEG adult Result Date: 05/01/2023 Charlsie Quest, MD     05/01/2023 12:27 PM Patient Name: CLEO VILLAMIZAR MRN: [id number] Epilepsy Attending: Charlsie Quest Referring Physician/Provider: Lidia Collum, PA-C Date: 05/01/2023 Duration: 22.33 mins Patient history: 58yo M s/p cardiac arrest. EEG to evaluate for seizure Level of alertness: lethargic/ awake AEDs during EEG study: Propofol Technical aspects: This EEG study was done with scalp electrodes positioned according to the  10-20 International system of electrode placement. Electrical activity was reviewed with band pass filter of 1-70Hz , sensitivity of 7 uV/mm, display speed of 20mm/sec with a 60Hz  notched filter applied as appropriate. EEG data were recorded continuously and digitally stored.  Video monitoring was available and reviewed as appropriate. Description: EEG showed continuous generalized 3 to 6 Hz theta-delta slowing. Hyperventilation and photic stimulation were not performed.   ABNORMALITY - Continuous slow, generalized IMPRESSION: This study is suggestive of moderate diffuse encephalopathy. No seizures or epileptiform discharges were seen  throughout the recording. Charlsie Quest   DG Chest Port 1 View Result Date: 05/01/2023 CLINICAL DATA:  Mm dependence. EXAM: PORTABLE CHEST 1 VIEW COMPARISON:  05/01/2023 FINDINGS: Endotracheal tube tip is 3.7 cm above the base of the carina. Right IJ central line tip projects at the mid SVC level. The cardiopericardial silhouette is within normal limits for size. Diffuse interstitial opacity suggests edema. Similar left base collapse/consolidation with layering left pleural effusion. Telemetry leads overlie the chest. IMPRESSION: Pulmonary edema. Similar left base collapse/consolidation with layering left pleural effusion. Electronically Signed   By: Kennith Center M.D.   On: 05/01/2023 06:11   DG Chest Port 1 View Result Date: 05/01/2023 CLINICAL DATA:  Check endotracheal tube placement EXAM: PORTABLE CHEST 1 VIEW COMPARISON:  Film from the previous day. FINDINGS: Endotracheal tube lies 5.5 cm above the carina slightly advanced when compared with the prior exam. Aortic calcifications are seen. Right jugular central line is noted in the mid superior vena cava. Hazy opacities are again identified throughout both lungs most consistent with edema. Left retrocardiac consolidation is noted stable from the prior study. IMPRESSION: Stable changes of edema. Endotracheal tube has been advanced in the interval. Electronically Signed   By: Alcide Clever M.D.   On: 05/01/2023 00:56   DG Abd Portable 1V Result Date: 04/30/2023 CLINICAL DATA:  OG tube placement. EXAM: PORTABLE ABDOMEN - 1 VIEW COMPARISON:  CT earlier today FINDINGS: No enteric tube is seen in the upper abdomen or lower chest. There multiple overlying monitoring devices in place. Mild gaseous gastric distension. IMPRESSION: No enteric tube is seen in the upper abdomen or lower chest. Recommend removal and replacement. If a tube has been placed, at the time of the exam recommend repositioning. Electronically Signed   By: Narda Rutherford M.D.   On:  04/30/2023 20:38   CT Angio Chest PE W and/or Wo Contrast Result Date: 04/30/2023 CLINICAL DATA:  Mental status change. Chest wall pain. "Malignancy known or suspected". Abdominal pain. Right upper quadrant. EXAM: CT ANGIOGRAPHY CHEST CT ABDOMEN AND PELVIS WITH CONTRAST TECHNIQUE: Multidetector CT imaging of the chest was performed using the standard protocol during bolus administration of intravenous contrast. Multiplanar CT image reconstructions and MIPs were obtained to evaluate the vascular anatomy. Multidetector CT imaging of the abdomen and pelvis was performed using the standard protocol during bolus administration of intravenous contrast. RADIATION DOSE REDUCTION: This exam was performed according to the departmental dose-optimization program which includes automated exposure control, adjustment of the mA and/or kV according to patient size and/or use of iterative reconstruction technique. CONTRAST:  75mL OMNIPAQUE IOHEXOL 350 MG/ML SOLN COMPARISON:  Prior CTA chest 03/29/2023. Abdominopelvic CT of 01/18/2023 FINDINGS: CTA CHEST FINDINGS Cardiovascular: The quality of this exam for evaluation of pulmonary embolism is moderate to good. No evidence of pulmonary embolism. Aortic atherosclerosis. Left carotid artery atherosclerosis as detailed previously. Normal heart size, without pericardial effusion. Lad and right coronary artery calcification. Right-sided central line from an  IJ approach terminates in the low SVC. Mediastinum/Nodes: New mild right paratracheal adenopathy at 1.0 cm. Mild prevascular adenopathy at up to 8 mm, similar. Likely reactive. Lungs/Pleura: Trace left pleural fluid. Endotracheal tube terminates well above the carina. Development of diffuse multifocal ground-glass and airspace disease. The most focal/confluent consolidation is in the left lower lobe. No pulmonary necrosis. Musculoskeletal: Remote bilateral rib fractures. 5th anterior right rib fracture favored to be acute or subacute  on 115/4. Review of the MIP images confirms the above findings. CT ABDOMEN and PELVIS FINDINGS Hepatobiliary: Normal liver. Cholecystectomy, without biliary ductal dilatation. Pancreas: Normal, without mass or ductal dilatation. Spleen: Normal in size, without focal abnormality. Adrenals/Urinary Tract: Normal adrenal glands. Normal kidneys, without hydronephrosis. Suprapubic bladder catheter. The pericatheter anterior pelvic wall collection has resolved. Stomach/Bowel: Normal stomach, without wall thickening. Large stool ball in the rectum of 5.7 cm. Portions of abdominal exam are mildly motion degraded. Normal terminal ileum and appendix. Portions of the right-side of the colon appear thick walled, favored to be due to underdistention. Normal small bowel. Vascular/Lymphatic: Advanced aortic and branch vessel atherosclerosis. No abdominopelvic adenopathy. Reproductive: Normal prostate. Other: No significant free fluid.  No free intraperitoneal air. Musculoskeletal: No acute osseous abnormality. Review of the MIP images confirms the above findings. IMPRESSION: CT CHEST IMPRESSION 1.  No evidence of pulmonary embolism. 2. Development of diffuse airspace and ground-glass opacity, most confluent in the left lower lobe since 03/29/2023. Consistent with pneumonia. 3. Age advanced coronary artery atherosclerosis. Recommend assessment of coronary risk factors. 4. Trace left pleural fluid 5.  Aortic Atherosclerosis (ICD10-I70.0). 6. Bilateral primarily remote rib fractures. Anterior fifth rib fracture is favored to be acute or subacute. CT ABDOMEN AND PELVIS IMPRESSION 1. Mild motion degradation throughout. 2. Stool ball in the rectum suggests constipation or even fecal impaction. 3. Suprapubic catheter within the bladder, with resolution of pericatheter fluid collection. The bladder appears thick walled but is underdistended. Correlate with urinalysis to exclude 4. The ascending colon also appears thick walled but is  underdistended. Correlate with symptoms of infectious colitis. Cystitis. Electronically Signed   By: Jeronimo Greaves M.D.   On: 04/30/2023 20:37   CT ABDOMEN W CONTRAST Result Date: 04/30/2023 CLINICAL DATA:  Mental status change. Chest wall pain. "Malignancy known or suspected". Abdominal pain. Right upper quadrant. EXAM: CT ANGIOGRAPHY CHEST CT ABDOMEN AND PELVIS WITH CONTRAST TECHNIQUE: Multidetector CT imaging of the chest was performed using the standard protocol during bolus administration of intravenous contrast. Multiplanar CT image reconstructions and MIPs were obtained to evaluate the vascular anatomy. Multidetector CT imaging of the abdomen and pelvis was performed using the standard protocol during bolus administration of intravenous contrast. RADIATION DOSE REDUCTION: This exam was performed according to the departmental dose-optimization program which includes automated exposure control, adjustment of the mA and/or kV according to patient size and/or use of iterative reconstruction technique. CONTRAST:  75mL OMNIPAQUE IOHEXOL 350 MG/ML SOLN COMPARISON:  Prior CTA chest 03/29/2023. Abdominopelvic CT of 01/18/2023 FINDINGS: CTA CHEST FINDINGS Cardiovascular: The quality of this exam for evaluation of pulmonary embolism is moderate to good. No evidence of pulmonary embolism. Aortic atherosclerosis. Left carotid artery atherosclerosis as detailed previously. Normal heart size, without pericardial effusion. Lad and right coronary artery calcification. Right-sided central line from an IJ approach terminates in the low SVC. Mediastinum/Nodes: New mild right paratracheal adenopathy at 1.0 cm. Mild prevascular adenopathy at up to 8 mm, similar. Likely reactive. Lungs/Pleura: Trace left pleural fluid. Endotracheal tube terminates well above the carina. Development  of diffuse multifocal ground-glass and airspace disease. The most focal/confluent consolidation is in the left lower lobe. No pulmonary necrosis.  Musculoskeletal: Remote bilateral rib fractures. 5th anterior right rib fracture favored to be acute or subacute on 115/4. Review of the MIP images confirms the above findings. CT ABDOMEN and PELVIS FINDINGS Hepatobiliary: Normal liver. Cholecystectomy, without biliary ductal dilatation. Pancreas: Normal, without mass or ductal dilatation. Spleen: Normal in size, without focal abnormality. Adrenals/Urinary Tract: Normal adrenal glands. Normal kidneys, without hydronephrosis. Suprapubic bladder catheter. The pericatheter anterior pelvic wall collection has resolved. Stomach/Bowel: Normal stomach, without wall thickening. Large stool ball in the rectum of 5.7 cm. Portions of abdominal exam are mildly motion degraded. Normal terminal ileum and appendix. Portions of the right-side of the colon appear thick walled, favored to be due to underdistention. Normal small bowel. Vascular/Lymphatic: Advanced aortic and branch vessel atherosclerosis. No abdominopelvic adenopathy. Reproductive: Normal prostate. Other: No significant free fluid.  No free intraperitoneal air. Musculoskeletal: No acute osseous abnormality. Review of the MIP images confirms the above findings. IMPRESSION: CT CHEST IMPRESSION 1.  No evidence of pulmonary embolism. 2. Development of diffuse airspace and ground-glass opacity, most confluent in the left lower lobe since 03/29/2023. Consistent with pneumonia. 3. Age advanced coronary artery atherosclerosis. Recommend assessment of coronary risk factors. 4. Trace left pleural fluid 5.  Aortic Atherosclerosis (ICD10-I70.0). 6. Bilateral primarily remote rib fractures. Anterior fifth rib fracture is favored to be acute or subacute. CT ABDOMEN AND PELVIS IMPRESSION 1. Mild motion degradation throughout. 2. Stool ball in the rectum suggests constipation or even fecal impaction. 3. Suprapubic catheter within the bladder, with resolution of pericatheter fluid collection. The bladder appears thick walled but is  underdistended. Correlate with urinalysis to exclude 4. The ascending colon also appears thick walled but is underdistended. Correlate with symptoms of infectious colitis. Cystitis. Electronically Signed   By: Jeronimo Greaves M.D.   On: 04/30/2023 20:37   DG Chest Port 1 View Result Date: 04/30/2023 CLINICAL DATA:  Respiratory failure. EXAM: PORTABLE CHEST 1 VIEW COMPARISON:  Chest radiograph dated 03/24/2023 FINDINGS: Right-sided Port-A-Cath with tip over central SVC. Endotracheal tube approximately 7 cm above the carina. Bilateral diffuse bilateral interstitial and hazy airspace opacities may represent edema, pneumonia, or combination. A small left pleural effusion suspected. No pneumothorax. Top-normal cardiac silhouette. No acute osseous pathology. IMPRESSION: 1. Endotracheal tube approximately 7 cm above the carina. 2. Bilateral diffuse interstitial and hazy airspace opacities may represent edema, pneumonia, or combination. Electronically Signed   By: Elgie Collard M.D.   On: 04/30/2023 20:35   CT HEAD WO CONTRAST Result Date: 04/30/2023 CLINICAL DATA:  Blunt trauma EXAM: CT HEAD WITHOUT CONTRAST TECHNIQUE: Contiguous axial images were obtained from the base of the skull through the vertex without intravenous contrast. RADIATION DOSE REDUCTION: This exam was performed according to the departmental dose-optimization program which includes automated exposure control, adjustment of the mA and/or kV according to patient size and/or use of iterative reconstruction technique. COMPARISON:  09/24/2022 FINDINGS: Brain: No acute traumatic finding. No evidence of hemorrhage, stroke, mass, hydrocephalus or extra-axial collection. Some artifact from overlying support devices. Vascular: No abnormal vascular finding. Skull: Negative Sinuses/Orbits: Clear/normal Other: None IMPRESSION: No acute or traumatic finding. Some artifact from overlying support devices. Electronically Signed   By: Paulina Fusi M.D.   On:  04/30/2023 19:28   CT Cervical Spine Wo Contrast Result Date: 04/30/2023 CLINICAL DATA:  Blunt trauma EXAM: CT CERVICAL SPINE WITHOUT CONTRAST TECHNIQUE: Multidetector CT imaging of the cervical  spine was performed without intravenous contrast. Multiplanar CT image reconstructions were also generated. RADIATION DOSE REDUCTION: This exam was performed according to the departmental dose-optimization program which includes automated exposure control, adjustment of the mA and/or kV according to patient size and/or use of iterative reconstruction technique. COMPARISON:  09/25/2022 FINDINGS: Alignment: No malalignment.  Head is turned towards the left. Skull base and vertebrae: No regional fracture. Chronic fusion of the facet joints on the left at C3-4. Soft tissues and spinal canal: No significant soft tissue finding. Patient is intubated. Disc levels: No disc level abnormality. No stenosis of the canal or foramina. Upper chest: See results of chest CT. Other: None IMPRESSION: No acute or traumatic finding. Chronic fusion of the facet joints on the left at C3-4. Electronically Signed   By: Paulina Fusi M.D.   On: 04/30/2023 19:26    Microbiology Recent Results (from the past 240 hours)  Culture, blood (Routine X 2) w Reflex to ID Panel     Status: None   Collection Time: 04/30/23  6:14 PM   Specimen: BLOOD RIGHT ARM  Result Value Ref Range Status   Specimen Description BLOOD RIGHT ARM  Final   Special Requests   Final    BOTTLES DRAWN AEROBIC ONLY Blood Culture results may not be optimal due to an inadequate volume of blood received in culture bottles   Culture   Final    NO GROWTH 5 DAYS Performed at [facility], [address] [place], [place] [address]    Report Status 05/05/2023 FINAL  Final  Culture, blood (Routine X 2) w Reflex to ID Panel     Status: None   Collection Time: 04/30/23  6:18 PM   Specimen: BLOOD RIGHT ARM  Result Value Ref Range Status   Specimen Description BLOOD  RIGHT ARM  Final   Special Requests   Final    BOTTLES DRAWN AEROBIC ONLY Blood Culture results may not be optimal due to an inadequate volume of blood received in culture bottles   Culture   Final    NO GROWTH 5 DAYS Performed at [facility], [address], [place], [place] [address]    Report Status 05/05/2023 FINAL  Final  Resp panel by RT-PCR (RSV, Flu A&B, Covid) Anterior Nasal Swab     Status: Abnormal   Collection Time: 04/30/23  8:09 PM   Specimen: Anterior Nasal Swab  Result Value Ref Range Status   SARS Coronavirus 2 by RT PCR NEGATIVE NEGATIVE Final   Influenza A by PCR POSITIVE (A) NEGATIVE Final   Influenza B by PCR NEGATIVE NEGATIVE Final    Comment: (NOTE) The Xpert Xpress SARS-CoV-2/FLU/RSV plus assay is intended as an aid in the diagnosis of influenza from Nasopharyngeal swab specimens and should not be used as a sole basis for treatment. Nasal washings and aspirates are unacceptable for Xpert Xpress SARS-CoV-2/FLU/RSV testing.  Fact Sheet for Patients: BloggerCourse.com  Fact Sheet for Healthcare Providers: SeriousBroker.it  This test is not yet approved or cleared by the [place] FDA and has been authorized for detection and/or diagnosis of SARS-CoV-2 by FDA under an Emergency Use Authorization (EUA). This EUA will remain in effect (meaning this test can be used) for the duration of the COVID-19 declaration under Section 564(b)(1) of the Act, 21 U.S.C. section 360bbb-3(b)(1), unless the authorization is terminated or revoked.     Resp Syncytial Virus by PCR NEGATIVE NEGATIVE Final    Comment: (NOTE) Fact Sheet for Patients:  BloggerCourse.com  Fact Sheet for Healthcare Providers: SeriousBroker.it  This test is not yet approved or cleared by the [place] FDA and has been authorized for detection and/or diagnosis of SARS-CoV-2 by FDA under  an Emergency Use Authorization (EUA). This EUA will remain in effect (meaning this test can be used) for the duration of the COVID-19 declaration under Section 564(b)(1) of the Act, 21 U.S.C. section 360bbb-3(b)(1), unless the authorization is terminated or revoked.  Performed at [facility], [address] [place], [place] [address]   Respiratory (~20 pathogens) panel by PCR     Status: Abnormal   Collection Time: 05/01/23  5:36 AM   Specimen: Nasal Mucosa; Respiratory  Result Value Ref Range Status   Adenovirus NOT DETECTED NOT DETECTED Final   Coronavirus 229E NOT DETECTED NOT DETECTED Final    Comment: (NOTE) The Coronavirus on the Respiratory Panel, DOES NOT test for the novel  Coronavirus (2019 nCoV)    Coronavirus HKU1 NOT DETECTED NOT DETECTED Final   Coronavirus NL63 NOT DETECTED NOT DETECTED Final   Coronavirus OC43 NOT DETECTED NOT DETECTED Final   Metapneumovirus NOT DETECTED NOT DETECTED Final   Rhinovirus / Enterovirus NOT DETECTED NOT DETECTED Final   Influenza A DETECTED (A) NOT DETECTED Final    Comment: Referred to [facility] in [place], [place] for serotyping.   Influenza B NOT DETECTED NOT DETECTED Final   Parainfluenza Virus 1 NOT DETECTED NOT DETECTED Final   Parainfluenza Virus 2 NOT DETECTED NOT DETECTED Final   Parainfluenza Virus 3 NOT DETECTED NOT DETECTED Final   Parainfluenza Virus 4 NOT DETECTED NOT DETECTED Final   Respiratory Syncytial Virus NOT DETECTED NOT DETECTED Final   Bordetella pertussis NOT DETECTED NOT DETECTED Final   Bordetella Parapertussis NOT DETECTED NOT DETECTED Final   Chlamydophila pneumoniae NOT DETECTED NOT DETECTED Final   Mycoplasma pneumoniae NOT DETECTED NOT DETECTED Final    Comment: Performed at [facility], [address] [place], [place] [address]  MRSA Next Gen by PCR, Nasal     Status: Abnormal   Collection Time: 05/01/23  5:36 AM   Specimen: Nasal Mucosa; Nasal Swab  Result Value Ref Range  Status   MRSA by PCR Next Gen DETECTED (A) NOT DETECTED Final    Comment: RESULT CALLED TO, READ BACK BY AND VERIFIED WITH: RN sarah c. [phone] fcp (NOTE) The GeneXpert MRSA Assay (FDA approved for NASAL specimens only), is one component of a comprehensive MRSA colonization surveillance program. It is not intended to diagnose MRSA infection nor to guide or monitor treatment for MRSA infections. Test performance is not FDA approved in patients less than 76 years old. Performed at [facility], [address] [place], [place] [address]   Urine Culture     Status: Abnormal   Collection Time: 05/01/23  5:36 AM   Specimen: Urine, Random  Result Value Ref Range Status   Specimen Description URINE, RANDOM  Final   Special Requests   Final    NONE Reflexed from [id number] Performed at [facility], [address] [place], [place] [address]    Culture 40,000 COLONIES/mL YEAST (A)  Final   Report Status 05/02/2023 FINAL  Final  Culture, Respiratory w Gram Stain     Status: None   Collection Time: 05/01/23 12:29 PM   Specimen: Tracheal Aspirate; Respiratory  Result Value Ref Range Status   Specimen Description TRACHEAL ASPIRATE  Final   Special Requests NONE  Final   Gram Stain   Final    ABUNDANT WBC PRESENT, PREDOMINANTLY PMN RARE YEAST    Culture   Final    RARE KLEBSIELLA AEROGENES RARE GROUP B STREP(S.AGALACTIAE)ISOLATED TESTING AGAINST S. AGALACTIAE NOT ROUTINELY PERFORMED DUE TO PREDICTABILITY OF AMP/PEN/VAN SUSCEPTIBILITY. Performed at [facility], [name], [place], [place] [address]    Report Status 05/05/2023 FINAL  Final   Organism ID, Bacteria KLEBSIELLA AEROGENES  Final      Susceptibility   Klebsiella aerogenes - MIC*    CEFEPIME <=0.12 SENSITIVE Sensitive     CEFTAZIDIME <=1 SENSITIVE Sensitive     CEFTRIAXONE <=0.25 SENSITIVE Sensitive     CIPROFLOXACIN <=0.25 SENSITIVE Sensitive     GENTAMICIN <=1 SENSITIVE Sensitive     IMIPENEM 1  SENSITIVE Sensitive     TRIMETH/SULFA <=20 SENSITIVE Sensitive     PIP/TAZO <=4 SENSITIVE Sensitive ug/mL    * RARE KLEBSIELLA AEROGENES    Lab Basic Metabolic Panel: Recent Labs  Lab 05/02/23 0416 05/02/23 2007 05/03/23 0124 05/04/23 0522 05/05/23 0529  NA 138 140 140 141 139  K 4.0 3.7 3.6 3.6 4.2  CL 107  --  111 109 107  CO2 18*  --  19* 20* 22  GLUCOSE 84  --  162* 189* 256*  BUN 26*  --  35* 37* 37*  CREATININE 2.00*  --  1.78* 1.45* 1.30*  CALCIUM 7.9*  --  7.8* 8.0* 7.6*  MG 2.2  --  2.2 1.9 1.9  PHOS  --   --  5.1* 4.7* 3.9   Liver Function Tests: No results for input(s): "AST", "ALT", "ALKPHOS", "BILITOT", "PROT", "ALBUMIN" in the last 168 hours. No results for input(s): "LIPASE", "AMYLASE" in the last 168 hours. No results for input(s): "AMMONIA" in the last 168 hours. CBC: Recent Labs  Lab 05/02/23 0416 05/02/23 2007 05/02/23 2013 05/03/23 0124 05/03/23 0842 05/04/23 0522 05/05/23 0529  WBC 12.9*  --   --  11.6*  --  17.3* 15.0*  HGB 7.2*   < > 7.0* 6.8* 7.9* 7.3* 7.2*  HCT 22.2*   < > 22.0* 21.1* 24.3* 22.2* 22.8*  MCV 80.7  --   --  81.2  --  80.1 82.0  PLT 235  --   --  191  --  184 202   < > = values in this interval not displayed.   Cardiac Enzymes: No results for input(s): "CKTOTAL", "CKMB", "CKMBINDEX", "TROPONINI" in the last 168 hours. Sepsis Labs: Recent Labs  Lab 05/02/23 0416 05/03/23 0124 05/04/23 0522 05/05/23 0529  WBC 12.9* 11.6* 17.3* 15.0*    Procedures/Operations  Intubation Arterial line placement   Steffanie Dunn 04/25/2023, 6:20 PM

## 2023-05-12 NOTE — Progress Notes (Signed)
   Palliative Medicine Inpatient Follow Up Note HPI: 58 year old man who presented to [facility] ED 2/17 after OOH cardiac arrest. PMHx significant for HTN, HLD, CAD, TIA, COPD, mild OSA, T2DM (c/b gastroparesis and diabetic peripheral neuropathy), hypothyroidism, GERD, PUD/H. Pylori), bladder CA, hypogonadism (s/p bilateral orchiectomy), DDD with chronic back pain, RA, benign essential tremor, transient seizure vs. pseudoseizure, bipolar disorder, anxiety, PTSD. admitted on 04/30/2023 after OOH cardiac arrest.  Palliative care involved to support additional goals of care conversations.   Today's Discussion 05/09/2023  *Please note that this is a verbal dictation therefore any spelling or grammatical errors are due to the "Dragon Medical One" system interpretation.  Chart reviewed inclusive of vital signs, progress notes, laboratory results, and diagnostic images.   I met with Vincent [name] at bedside this morning. No family was at bedside and he appeared generally uncomfortable as his breathing was rapid. I provided a bolus.   I spoke to patients night RN, Systems developer and day RN, Vincent [name]. We established a plan to support patients family throughout the day. We reviewed patients decreasing O2 needs and I shared my assessment of limited time.  I was able to call and speak to patients daughter, Vincent [name]. I was able to shared that it would be within reason for family to come and offer support given I suspect we are in the final hours of patients life.  Went back to bedside and supported patients with nursing and Museum/gallery exhibitions officer. We reviewed that Vincent [name] is more anxious appearing - ativan and robinul provided by [place].  I spoke to patients daughter at bedside as she had come in. I offered them support.  _____________________ Addendum:  I checked in with patients family who were all congregated around the bed this afternoon. I shared that patients time is limited and thanked them for coming in.   Questions and concerns  addressed/Palliative Support Provided.   Objective Assessment: Vital Signs Vitals:   05/09/2023 0700 04/21/2023 0730  BP:  (!) 140/56  Pulse:  95  Resp:  (!) 24  Temp:  (!) 101.4 F (38.6 C)  SpO2: (!) 80% (!) 70%    Intake/Output Summary (Last 24 hours) at 04/16/2023 1232 Last data filed at 05/09/2023 4098 Gross per 24 hour  Intake 481.49 ml  Output 1600 ml  Net -1118.51 ml   Last Weight  Most recent update: 05/05/2023  6:53 AM    Weight  81.4 kg (179 lb 7.3 oz)            Gen:  Middle aged M in moderate distress HEENT: Dry mucous membranes CV: Regular rate and rhythm, no murmurs rubs or gallops PULM: On RA, (+)  diaphragmatic breathing ABD: soft/nontender EXT: No edema Neuro: Alert to self   SUMMARY OF RECOMMENDATIONS   DNAR/DNI  Comfort Care  Continue fentanyl gtt with boluses  Continue ativan 2mg  IVP ATC  Additional Comfort medications per [facility]  Unrestricted visitation  Ongoing support  Time: 11 Billing based on MDM: High ______________________________________________________________________________________ Vincent [name] Vincent [name] Palliative Medicine Team Team Cell Phone: [phone] Please utilize secure chat with additional questions, if there is no response within 30 minutes please call the above phone number  Palliative Medicine Team providers are available by phone from 7am to 7pm daily and can be reached through the team cell phone.  Should this patient require assistance outside of these hours, please call the patient's attending physician.

## 2023-05-12 NOTE — Progress Notes (Signed)
 Upon assessment, pt is unresponsive to any stimuli. Pupils are fixed and dilated. ECG shows no electrical activity. No pulses detected & no respiratory efforts are noted. Pt has no breath or heart sounds on auscultation. Pt pronounced deceased at 83 with second RN, Deepa at bedside. Family members at bedside and palliative team notified.

## 2023-05-12 NOTE — Progress Notes (Signed)
 250 ml of Fentanyl in NS 250 premix wasted with [place] LPN.

## 2023-05-12 NOTE — Plan of Care (Addendum)
  Problem: Safety: Goal: Non-violent Restraint(s) Outcome: Progressing   Problem: Education: Goal: Ability to describe self-care measures that may prevent or decrease complications (Diabetes Survival Skills Education) will improve Outcome: Progressing Goal: Individualized Educational Video(s) Outcome: Progressing   Problem: Coping: Goal: Ability to adjust to condition or change in health will improve Outcome: Progressing   Problem: Fluid Volume: Goal: Ability to maintain a balanced intake and output will improve Outcome: Progressing   Problem: Health Behavior/Discharge Planning: Goal: Ability to identify and utilize available resources and services will improve Outcome: Progressing Goal: Ability to manage health-related needs will improve Outcome: Progressing   Problem: Metabolic: Goal: Ability to maintain appropriate glucose levels will improve Outcome: Progressing   Problem: Nutritional: Goal: Maintenance of adequate nutrition will improve Outcome: Progressing Goal: Progress toward achieving an optimal weight will improve Outcome: Progressing   Problem: Skin Integrity: Goal: Risk for impaired skin integrity will decrease Outcome: Progressing   Problem: Tissue Perfusion: Goal: Adequacy of tissue perfusion will improve Outcome: Progressing   Problem: Education: Goal: Knowledge of General Education information will improve Description: Including pain rating scale, medication(s)/side effects and non-pharmacologic comfort measures Outcome: Progressing   Problem: Health Behavior/Discharge Planning: Goal: Ability to manage health-related needs will improve Outcome: Progressing   Problem: Clinical Measurements: Goal: Ability to maintain clinical measurements within normal limits will improve Outcome: Progressing Goal: Will remain free from infection Outcome: Progressing Goal: Diagnostic test results will improve Outcome: Progressing Goal: Respiratory complications  will improve Outcome: Progressing Goal: Cardiovascular complication will be avoided Outcome: Progressing   Problem: Activity: Goal: Risk for activity intolerance will decrease Outcome: Progressing   Problem: Nutrition: Goal: Adequate nutrition will be maintained Outcome: Progressing   Problem: Coping: Goal: Level of anxiety will decrease Outcome: Progressing   Problem: Elimination: Goal: Will not experience complications related to bowel motility Outcome: Progressing Goal: Will not experience complications related to urinary retention Outcome: Progressing   Problem: Pain Managment: Goal: General experience of comfort will improve and/or be controlled Outcome: Progressing   Problem: Safety: Goal: Ability to remain free from injury will improve Outcome: Progressing   Problem: Skin Integrity: Goal: Risk for impaired skin integrity will decrease Outcome: Progressing   Problem: Activity: Goal: Ability to tolerate increased activity will improve Outcome: Progressing   Problem: Respiratory: Goal: Ability to maintain a clear airway and adequate ventilation will improve Outcome: Progressing   Problem: Role Relationship: Goal: Method of communication will improve Outcome: Progressing

## 2023-05-12 DEATH — deceased

## 2023-12-19 LAB — MISCELLANEOUS TEST
